# Patient Record
Sex: Female | Born: 1940 | Race: White | Hispanic: No | Smoking: Former smoker
Health system: Southern US, Community
[De-identification: ages and names within clinical notes are randomized; demographics above are authoritative.]

## PROBLEM LIST (undated history)

## (undated) DIAGNOSIS — I4891 Unspecified atrial fibrillation: Secondary | ICD-10-CM

## (undated) DIAGNOSIS — I10 Essential (primary) hypertension: Secondary | ICD-10-CM

## (undated) DIAGNOSIS — S060X9A Concussion with loss of consciousness of unspecified duration, initial encounter: Secondary | ICD-10-CM

## (undated) DIAGNOSIS — T4145XA Adverse effect of unspecified anesthetic, initial encounter: Secondary | ICD-10-CM

## (undated) DIAGNOSIS — L4050 Arthropathic psoriasis, unspecified: Secondary | ICD-10-CM

## (undated) DIAGNOSIS — G629 Polyneuropathy, unspecified: Secondary | ICD-10-CM

## (undated) DIAGNOSIS — Z9289 Personal history of other medical treatment: Secondary | ICD-10-CM

## (undated) DIAGNOSIS — M1990 Unspecified osteoarthritis, unspecified site: Secondary | ICD-10-CM

## (undated) DIAGNOSIS — T8859XA Other complications of anesthesia, initial encounter: Secondary | ICD-10-CM

## (undated) DIAGNOSIS — D649 Anemia, unspecified: Secondary | ICD-10-CM

## (undated) DIAGNOSIS — I499 Cardiac arrhythmia, unspecified: Secondary | ICD-10-CM

## (undated) DIAGNOSIS — S060XAA Concussion with loss of consciousness status unknown, initial encounter: Secondary | ICD-10-CM

## (undated) DIAGNOSIS — K219 Gastro-esophageal reflux disease without esophagitis: Secondary | ICD-10-CM

## (undated) HISTORY — PX: JOINT REPLACEMENT: SHX530

## (undated) HISTORY — PX: CHOLECYSTECTOMY: SHX55

## (undated) HISTORY — DX: Polyneuropathy, unspecified: G62.9

## (undated) HISTORY — PX: EYE SURGERY: SHX253

## (undated) HISTORY — PX: BACK SURGERY: SHX140

---

## 1972-01-26 HISTORY — PX: TUBAL LIGATION: SHX77

## 1981-01-25 HISTORY — PX: VAGINAL HYSTERECTOMY: SUR661

## 1997-08-15 ENCOUNTER — Other Ambulatory Visit: Admission: RE | Admit: 1997-08-15 | Discharge: 1997-08-15 | Payer: Self-pay | Admitting: Dermatology

## 1998-04-29 ENCOUNTER — Ambulatory Visit (HOSPITAL_COMMUNITY): Admission: RE | Admit: 1998-04-29 | Discharge: 1998-04-29 | Payer: Self-pay | Admitting: Gastroenterology

## 2000-01-07 ENCOUNTER — Emergency Department (HOSPITAL_COMMUNITY): Admission: EM | Admit: 2000-01-07 | Discharge: 2000-01-08 | Payer: Self-pay | Admitting: Emergency Medicine

## 2000-01-08 ENCOUNTER — Encounter: Payer: Self-pay | Admitting: Emergency Medicine

## 2000-02-05 ENCOUNTER — Encounter: Admission: RE | Admit: 2000-02-05 | Discharge: 2000-02-05 | Payer: Self-pay | Admitting: Orthopedic Surgery

## 2000-02-05 ENCOUNTER — Encounter: Payer: Self-pay | Admitting: Orthopedic Surgery

## 2000-12-12 ENCOUNTER — Encounter (HOSPITAL_BASED_OUTPATIENT_CLINIC_OR_DEPARTMENT_OTHER): Payer: Self-pay | Attending: General Surgery | Admitting: General Surgery

## 2002-05-20 ENCOUNTER — Encounter: Payer: Self-pay | Admitting: Emergency Medicine

## 2002-05-20 ENCOUNTER — Encounter: Payer: Self-pay | Admitting: Surgery

## 2002-05-20 ENCOUNTER — Inpatient Hospital Stay (HOSPITAL_COMMUNITY): Admission: EM | Admit: 2002-05-20 | Discharge: 2002-05-24 | Payer: Self-pay | Admitting: Emergency Medicine

## 2002-05-23 ENCOUNTER — Encounter: Payer: Self-pay | Admitting: Surgery

## 2002-05-23 ENCOUNTER — Encounter (INDEPENDENT_AMBULATORY_CARE_PROVIDER_SITE_OTHER): Payer: Self-pay | Admitting: *Deleted

## 2003-01-26 HISTORY — PX: POSTERIOR LUMBAR FUSION: SHX6036

## 2003-05-06 ENCOUNTER — Inpatient Hospital Stay (HOSPITAL_COMMUNITY): Admission: RE | Admit: 2003-05-06 | Discharge: 2003-05-08 | Payer: Self-pay | Admitting: Neurosurgery

## 2005-01-25 HISTORY — PX: REPLACEMENT TOTAL KNEE BILATERAL: SUR1225

## 2005-01-25 HISTORY — PX: CATARACT EXTRACTION W/ INTRAOCULAR LENS  IMPLANT, BILATERAL: SHX1307

## 2005-02-15 ENCOUNTER — Inpatient Hospital Stay (HOSPITAL_COMMUNITY): Admission: RE | Admit: 2005-02-15 | Discharge: 2005-02-19 | Payer: Self-pay | Admitting: Orthopedic Surgery

## 2005-05-31 ENCOUNTER — Encounter: Payer: Self-pay | Admitting: Cardiology

## 2005-05-31 ENCOUNTER — Inpatient Hospital Stay (HOSPITAL_COMMUNITY): Admission: RE | Admit: 2005-05-31 | Discharge: 2005-06-04 | Payer: Self-pay | Admitting: Orthopedic Surgery

## 2005-05-31 ENCOUNTER — Ambulatory Visit: Payer: Self-pay | Admitting: Cardiology

## 2006-04-08 ENCOUNTER — Encounter: Admission: RE | Admit: 2006-04-08 | Discharge: 2006-04-08 | Payer: Self-pay | Admitting: Internal Medicine

## 2006-05-23 ENCOUNTER — Inpatient Hospital Stay (HOSPITAL_COMMUNITY): Admission: EM | Admit: 2006-05-23 | Discharge: 2006-05-25 | Payer: Self-pay | Admitting: *Deleted

## 2006-05-24 ENCOUNTER — Encounter (INDEPENDENT_AMBULATORY_CARE_PROVIDER_SITE_OTHER): Payer: Self-pay | Admitting: Cardiovascular Disease

## 2006-05-24 HISTORY — PX: TRANSTHORACIC ECHOCARDIOGRAM: SHX275

## 2006-06-21 ENCOUNTER — Inpatient Hospital Stay (HOSPITAL_COMMUNITY): Admission: AD | Admit: 2006-06-21 | Discharge: 2006-06-24 | Payer: Self-pay | Admitting: Internal Medicine

## 2006-06-24 ENCOUNTER — Ambulatory Visit: Payer: Self-pay | Admitting: Gastroenterology

## 2006-08-14 ENCOUNTER — Encounter: Admission: RE | Admit: 2006-08-14 | Discharge: 2006-08-14 | Payer: Self-pay | Admitting: Neurosurgery

## 2007-02-01 ENCOUNTER — Encounter: Admission: RE | Admit: 2007-02-01 | Discharge: 2007-03-15 | Payer: Self-pay | Admitting: Neurology

## 2010-06-09 NOTE — H&P (Signed)
NAMETEIA, FREITAS NO.:  [id number]   MEDICAL RECORD NO.:  [id number]          PATIENT TYPE:  INP   LOCATION:  1338                         FACILITY:  [facility]   PHYSICIAN:  Barry Dienes. Eloise Harman, M.D.DATE OF BIRTH:  21-Sep-1940   DATE OF ADMISSION:  06/21/2006  DATE OF DISCHARGE:                              HISTORY & PHYSICAL   CHIEF COMPLAINT:  Nausea, vomiting and fever.   HISTORY OF PRESENT ILLNESS:  The patient is a 70 year old white female  who is well-known to me.  Starting last night, she developed nausea,  vomiting, fever and chills.  She had been eating and drinking well until  that point.  Yesterday, she ate and drank very little and did not take  any of her usual medications.  She had not had recent diarrhea, dysuria,  frequency, headaches, stiff neck, shortness of breath, or productive  cough.   She had recently been admitted to the hospital on May 23, 2006 for  similar symptoms and was noted to have an E. coli urinary tract  infection that was treated with antibiotics with rapid clinical  resolution and discharged to home within a few days of admission.   PAST MEDICAL HISTORY:  1. Osteoporosis with T12 wedge compression fracture.  2. Hypertension.  3. Psoriatic arthritis.  4. Osteoarthritis.  5. Iatrogenic adrenal insufficiency from chronic prednisone at 5 mg      daily for several years.  6. Gallstone pancreatitis leading to laparoscopic cholecystectomy,      2004.  7. Paroxysmal atrial fibrillation during her last admission that      rapidly resolved with IV Cardizem.  8. Lumbar spine degenerative disk disease.  9. Chronic low back pain for which she uses a cane to ambulate.  10.Mild anemia for which she had previously declined GI evaluation.   MEDICATIONS PRIOR TO HOSPITAL ADMISSION:  1. Neurontin 600 mg twice daily.  2. Celebrex 200 mg twice daily.  3. Premarin 0.3 mg p.o. daily.  4. Prednisone 5 mg daily.  5. Lasix 40 mg daily.  6.  Potassium chloride 20 mEq daily.  7. Norvasc 5 mg daily.  8. Toprol-XL 50 mg daily.  9. Flaxseed oil 1200 mg daily.  10.Multivitamin one tablet daily.  11.Prilosec 20 mg daily.  12.Aspirin 325 mg daily.  13.Iron sulfate 325 mg daily.  14.Calcium 800 mg daily.  15.Vitamin A and D supplements once daily.  16.Enbrel 50 mg injection every Tuesday.   ALLERGIES:  FOSAMAX has been associated with GI upset.   PAST SURGICAL HISTORY:  1. Total abdominal hysterectomy, 1993.  2. Liver biopsies to evaluate methotrexate treatment, 1995, 1997 and      2000.  3. April 2004, laparoscopic cholecystectomy.  4. April 2005, L3-4 and L4-5 decompressive laminectomy.  5. January 2007, right total knee replacement.  6. May 2007, left total knee replacement.  7. April 2007, bilateral cataract operations.   SOCIAL HISTORY:  She is married and is retired from a career with the  [employer].  She has a tobacco history of  25-pack-years that was stopped  in 1983 and no history of alcohol abuse.   FAMILY HISTORY:  Her father died at age 60 of a brain tumor.  Her mother  died at age 35 from cirrhosis due to alcoholism.  She has a brother in  his 92s and a half sister who died at age 7 of lung cancer.  She has a  94 year old daughter and a 37 year old son.  One son died at age 14 of a  hemorrhagic stroke in December 72.   REVIEW OF SYSTEMS:  See history of present illness.   INITIAL PHYSICAL EXAMINATION:  VITAL SIGNS:  Blood pressure 104/50,  pulse 112, respirations 22, temperature 103.1 degrees Fahrenheit.  GENERAL:  She is a mildly overweight white female who looks fatigued  while sitting upright in a wheelchair.  HEENT:  Exam was within normal limits.  NECK:  Supple and without jugulovenous distention or carotid bruit.  CHEST:  Very minimal left basilar crackles.  HEART:  Regular rhythm and rate without significant murmur or gallop.  ABDOMEN:  Greatly reduced bowel sounds  with no hepatosplenomegaly or  tenderness to palpation.  EXTREMITIES:  Without cyanosis, clubbing, or edema.  NEUROLOGIC:  She was alert with normal orientation and showed no focal  neurologic deficits.   INITIAL LABORATORY STUDIES:  Serum sodium 140, potassium 3.4, chloride  109, carbon dioxide 23, BUN 17, creatinine 1.0, glucose 114.  White  blood cell count 12.1, hemoglobin 11, hematocrit 34, platelets 214,000.  Total protein 5.3, albumin 2.9.  Liver-associated enzymes normal.  Urinalysis had 11-20 white blood cells with many bacteria.   Acute abdomen series x-rays showed normal chest x-ray with dilated small  bowel loops in the left and mid abdomen consistent with a small-bowel  obstruction.   A stool Hemoccult test was positive.   IMPRESSION AND PLAN:  1. Fever with nausea and vomiting:  This is most likely due to acute      pyelonephritis with secondary illness.  Other etiologies of dilated      small bowel loops are possible such as partial small-bowel      obstruction, given that she has had some abdominal surgery, or      inflammatory bowel disease, or neoplasm, given her previous      reluctance to have routine colonoscopy screening performed.  We      have initiated treatment with Rocephin 1 g intramuscularly in the      office and will continue 1 g intravenously once daily pending      results of urine culture.  2. Anemia:  Mild and possibly due to chronic disease.  It is also      possible that she has occult slow gastrointestinal bleeding.  We      will check usual anemia workup laboratories and request a      gastrointestinal consultation to exclude the possibility of partial      bowel obstruction or neoplasm contributing to her symptoms.  3. Adrenal insufficiency:  Iatrogenic and presumed due to longstanding      treatment with prednisone 5 mg.  She has been given Depo-Medrol 40     mg in the office and will be continued on hydrocortisone while she      is acutely  ill.  4. Gastroesophageal reflux disease:  Stable on proton pump inhibitor      treatment.  5. Psoriatic arthritis:  Reasonably stable on her previous outpatient      regimen.  Her oral medications will be restarted when  she resumes      normal bowel function.           ______________________________  Barry Dienes Eloise Harman, M.D.     DGP/MEDQ  D:  06/22/2006  T:  06/23/2006  Job:  [id number]   cc:   Windy Fast A. Darrelyn Hillock, M.D.  Fax: [phone]   Velora Heckler, MD  [phone] [place]  [place] [address]

## 2010-06-09 NOTE — Discharge Summary (Signed)
Andrea Yu, BYRD NO.:  [id number]   MEDICAL RECORD NO.:  [id number]          PATIENT TYPE:  INP   LOCATION:  4729                         FACILITY:  [facility]   PHYSICIAN:  Barry Dienes. Eloise Harman, M.D.DATE OF BIRTH:  Mar 11, 1940   DATE OF ADMISSION:  05/23/2006  DATE OF DISCHARGE:  05/25/2006                               DISCHARGE SUMMARY   PERTINENT FINDINGS:  The patient is a 70 year old white female with  multiple medical problems who developed symptoms of tactile  temperatures, chills, nausea, vomiting, and fever, over 2 days prior to  hospital admission.  She denied significant abdominal pain,  constipation, diarrhea, coffee-ground emesis, headaches, chest pain,  palpitations, orthopnea, or dyspnea on exertion.  Due to her nausea and  vomiting, she had been unable to ingest her normal medications, which  include prednisone 5 mg daily for several years.   See the admission history and physical for details of her extensive past  medical history, medications prior to admission, allergies, social  history, family history, review of systems, and past surgical history.   INITIAL PHYSICAL EXAM:  VITAL SIGNS:  Blood pressure 130/70, pulse 132,  respirations 22, temperature 98.6.  GENERAL:  She is an overweight white female who had nausea while sitting  in a wheelchair in our office.  She was somewhat pale appearing.  HEENT:  Exam was within normal limits.  NECK:  Without jugular venous distension or carotid bruit.  CHEST:  Clear to auscultation.  HEART:  Rapid, irregularly irregular rhythm without significant murmur  or gallop.  ABDOMEN:  Had normal bowel sounds and no tenderness.  EXTREMITIES:  Were without cyanosis, clubbing, or edema.  NEUROLOGICAL:  Exam was nonfocal.   INITIAL LABORATORY STUDIES:  White blood cell 17.2 with 90% neutrophils,  hemoglobin 12, hematocrit 36, platelets 154, serum sodium 140, potassium  3.3, chloride 107, BUN 14, creatinine 1.1,  glucose, 127, TSH 1.4.  Urinalysis was nitrite negative with white blood cells 11-20 and  bacteria many.   Acute abdomen series x-rays included normal chest x-ray and no acute  abdominal disease.   EKG showed the following:  1. Atrial fibrillation with a ventricular rate of 147.  2. Nonspecific ST-segment abnormalities.  Shortly after starting IV      Cardizem in the emergency room.  Her EKG converted to normal sinus      rhythm.   HOSPITAL COURSE:  The patient was admitted to a medical bed with  telemetry.  She was started on IV fluids and IV antibiotics for presumed  urinary tract infection.  She was seen by a cardiology consultant who  assisted with optimizing her cardiac medications.  She was also given  stress dose IV corticosteroids.  Her symptoms rapidly improved.  Her  urine culture grew out E-coli that was pansensitive at greater than  100,000 colony-forming units per milliliter.  She also had a 2-D  echocardiogram that showed normal left ventricular systolic function and  normal left atrial size.  It was recommended that she continued on  aspirin for stroke prophylaxis, since she so rapidly converted to a  sinus rhythm and had an echo that suggested she could stay out of atrial  fibrillation.   PROCEDURES:  A 2-D echocardiogram.   COMPLICATIONS:  None.   CONDITION ON DISCHARGE:  She had mild diarrhea but no nausea and had  been eating well.  She had no shortness of breath or palpitations or  chest pain.   PHYSICAL EXAMINATION:  VITAL SIGNS:  Most recent physical exam included  vital signs with blood pressure 122/64, pulse 69, respirations 18,  temperature 98.1, pulse oxygen saturation 97% on room air.  CHEST:  Clear to auscultation.  HEART:  Regular rate and rhythm.  ABDOMEN:  Benign.  EXTREMITIES:  Had bilateral trace ankle edema.   LABORATORY:  Most recent laboratory tests include white blood cell count  12.1, hemoglobin 8.9, hematocrit 27, platelets 135, serum  sodium 143,  potassium 3.1, chloride 110, carbon dioxide 24, BUN 11, creatinine 0.8,  glucose 145, magnesium 2.2, hemoglobin A1c 4.9%, amylase 18, lipase 14,  total cholesterol 90, triglycerides 285, HDL 24, LDL 9, troponin-I 1.61   DISCHARGE DIAGNOSES:  1. Acute pyelonephritis.  2. Paroxysmal atrial fibrillation.  3. Iatrogenic adrenal insufficiency.  4. Osteoporosis with T12 compression fracture.  5. Hypertension.  6. Psoriatic arthritis.  7. Osteoarthritis.  8. History of gallstone pancreatitis in 2004, leading to laparoscopic      cholecystectomy.  9. Low back pain with degenerative disk disease, status post lumbar      laminectomy in April 2005.  10.Status post bilateral total knee replacement surgery.  11.Hypokalemia.  12.Anemia.   DISCHARGE MEDICATIONS:  1. Ecotrin 325 mg p.o. daily.  2. Toprol XL 50 mg p.o. daily.  3. OTC Prilosec 20 mg p.o. daily.  4. Potassium chloride 20 mEq p.o. twice daily.  5. Prednisone 5 mg tablets at 4 tablets daily for 2 days, then 3      tablets daily for 2 days, then 2 tablets daily for 1 day, then 1      tablet daily.  6. Vantin 200 mg p.o. twice daily for 7 days.  7. Kaopectate as needed for diarrhea.  8. No Lasix 40 mg daily for the next 2 days.  9. She is advised to stop Ziac and no and not have Enbrel this week.  10.Fish oil 1,200 mg daily.  11.Iron 1 tablet daily.  12.Neurontin 600 mg p.o. b.i.d.  13.Premarin 0.3 mg p.o. daily.  14.Norvasc 5 mg daily.  15.Celebrex 2 mg twice daily.  16.Multivitamin 1 tablet daily.  17.Citracal D 600 3 times daily.  18.Vicodin 5/500 1 tablet p.o. b.i.d. p.r.n. pain.  19.Balmex diaper cream as needed for yeast rash.  She was advised to      call our office as soon as possible, if her diarrhea worsens or      becomes associated with fever, chills, or if nausea or vomiting     develops.  She was advised to have a follow-up appointment with Dr.      Eloise Harman at [facility], in  approximately 1 week      following discharge, and to have follow follow-up evaluation with      Dr. Jenne Campus at [facility], in approximately 1 month      following discharge.           ______________________________  Barry Dienes. Eloise Harman, M.D.     DGP/MEDQ  D:  07/21/2006  T:  07/21/2006  Job:  [id number]   cc:   Pollyann Savoy, M.D.  Darlin Priestly,  MD

## 2010-06-09 NOTE — Discharge Summary (Signed)
Andrea Yu, Andrea Yu NO.:  [id number]   MEDICAL RECORD NO.:  [id number]          PATIENT TYPE:  INP   LOCATION:  1338                         FACILITY:  [facility]   PHYSICIAN:  Barry Dienes. Eloise Harman, M.D.DATE OF BIRTH:  05/04/1940   DATE OF ADMISSION:  06/21/2006  DATE OF DISCHARGE:  06/24/2006                               DISCHARGE SUMMARY   PERTINENT FINDINGS:  Patient is a 70 year old white female who developed  nausea, vomiting, fever, and chills on the night prior to admission.  She also ate and drank very little and did not have her usual medicines.  She had not had recent diarrhea, dysuria, frequency, headaches, stiff  neck, shortness of breath, or productive cough.  She had recently been  admitted to the hospital on April 28 for similar symptoms and was noted  to have an E. coli urinary tract infection that was treated with  antibiotics with rapid resolution of symptoms.   See admission history and physical for details of her past medical  history, medications prior to admission, past surgical history, and  review of systems.   INITIAL PHYSICAL EXAMINATION:  VITAL SIGNS:  Blood pressure 104/50,  pulse 112, respirations 22, temperature 103.1 degrees Fahrenheit.  GENERAL:  She is a mildly overweight white female who looks fatigued  while sitting upright in a wheelchair.  HEENT:  Head, eyes, ears, nose and throat exam was within normal limits.  NECK:  Supple and without jugular venous distention or carotid bruit.  CHEST:  Had very minimal left basilar crackles.  HEART:  Had a regular rate and rhythm without significant murmur or  gallop.  ABDOMEN:  Had greatly reduced bowel sounds with no hepatosplenomegaly or  tenderness to palpation.  EXTREMITIES:  Without cyanosis, clubbing or edema.  NEUROLOGICAL:  She was alert with normal orientation, showed no focal  neurologic deficits.   INITIAL LABORATORY STUDIES:  Serum sodium 140, potassium 3.4, chloride  109,  carbon dioxide 23, BUN 17, creatinine 1.0, glucose 114.  White  blood cell count 12.1, hemoglobin 11, hematocrit 34, platelets 214.  Total protein 5.2, albumin 2.9.  Liver-associated enzymes normal.  Urinalysis had 11-20 white blood cells with many bacteria.  Acute  abdomen x-ray showed normal chest x-ray with dilated small bowel loops  in the left and mid abdomen suggestive of small bowel obstruction.  A  stool hemoccult test was positive.   HOSPITAL COURSE:  Patient was admitted to a medical bed without  telemetry.  She was started on Rocephin IV with stress-dose  corticosteroids for presumptive recurrent urinary tract infection.  She  was also given IV fluids.  With IV fluids her hematocrit decreased to  the 27 range, so anemia workup labs were done.  She was also seen by a  GI consult, who reviewed a CT scan of the abdomen and pelvis that was  most notable for showing ileum wall thickening suggestive of enteritis.  He recommended obtaining stool cultures, which were pending at the time  of discharge, as well as C. difficile test and eventual colonoscopy with  intubation of the terminal  ileum after resolution of her acute illness.  Her symptoms rapidly improved with Rocephin and she became afebrile.  She was able to tolerate an advancing diet without difficulty.   PROCEDURES:  CT scan of the abdomen and pelvis which showed the  following:  1. A 4.9-mm lingula nodule.  2. Decreased distention of small bowel loops, now to a maximum 3.3 cm.  3. Fatty liver.  4. Mild perinephric fat stranding around left kidney.  5. Ileum wall thickening.  6. No evidence of renal stones or hydronephrosis.   COMPLICATIONS:  None.   CONDITION ON DISCHARGE:  She had no nausea, vomiting, diarrhea, or  shortness of breath.   Most recent physical exam shows blood pressure 157/60, pulse 64,  respirations 16, temp 98.2, pulse oxygen saturation 97% on room air.  Her chest was clear to auscultation.  Her  heart had a regular rate and  rhythm without significant murmur or gallop.  The abdomen had normal  bowel sounds and no hepatosplenomegaly or tenderness.  Extremities were  without cyanosis, clubbing or edema.   Most recent laboratory tests include PT of 14.8, INR of 1.1 (note that  Coumadin was held).  Stool C. difficile toxin test was negative.  Stool  cultures with ova and parasite testing were pending.  Urine culture was  negative.  Reticulocyte count was 3.3%, serum iron less than 10,  ferritin 50.  Stool hemoccult test was positive.  Serum sodium 143,  potassium 4.1, chloride 114, carbon dioxide 24, BUN 13, creatinine 0.87,  glucose 129.  White blood cell count 8.3, hemoglobin 9.5, hematocrit  28.2, platelets 169.   DIAGNOSES:  1. Acute enteritis with nausea and vomiting.  2. Bacteruria.  3. Iatrogenic adrenal insufficiency.  4. Osteoporosis with T12 compression fracture.  5. History of paroxysmal atrial fibrillation.  6. Hypertension.  7. Psoriatic arthritis.  8. Osteoarthritis.  9. Lumbar spine degenerative disk disease.  10.Chronic low back pain.  11.Anemia, iron deficiency.   DISCHARGE MEDICATIONS:  1. Neurontin 300 mg two tabs p.o. b.i.d.  2. Celebrex 200 mg p.o. b.i.d. p.r.n. pain.  3. Premarin 0.3 mg p.o. daily.  4. Lasix 40 mg p.o. q.a.m. to be restarted in two days.  5. Cardizem CD 120 mg p.o. daily.  6. Toprol XL 50 mg p.o. q.p.m.  7. Prilosec 20 mg p.o. daily.  8. Aspirin 325 mg p.o. daily.  9. Potassium chloride 20 mEq p.o. daily.  10.Iron sulfate 325 mg p.o. twice daily.  11.Calcium 1800 mg daily (Citracal D 600 three times daily), vitamin      A, flaxseed, and multivitamin per usual.  12.Enbrel 50 mg injection every Tuesday to be restarted in two weeks.  13.Prednisone 5-mg tablets as follows:  Three tabs daily for four      days, then two tabs daily for three days, then one tab daily. 14.Coumadin 5 mg daily.  15.Keflex 500 mg p.o. t.i.d. x7 days.    DISPOSITION AND FOLLOWUP:  The patient is to be discharged to home if  she tolerates a regular breakfast well.  She should have a followup  appointment with Dr. Jarome Matin of [facility]  either next Tuesday or Wednesday of next week and can call [phone] to  schedule that appointment.  She should also have a followup appointment  with Dr. Russella Dar in approximately 2-3 weeks at [facility].  She  was advised to call Dr. Eloise Harman for any unusual bleeding or recurrent  fever, chills or weakness.  ______________________________  Barry Dienes Eloise Harman, M.D.     DGP/MEDQ  D:  06/24/2006  T:  06/24/2006  Job:  [id number]   cc:   Venita Lick. Russella Dar, MD, FACG  [address]  [place]  [place] [address]   Dr. Jenne Campus, MD

## 2010-06-09 NOTE — Discharge Summary (Signed)
Andrea Yu, STRICKER NO.:  [id number]   MEDICAL RECORD NO.:  [id number]          PATIENT TYPE:  INP   LOCATION:  4729                         FACILITY:  [facility]   PHYSICIAN:  Barry Dienes. Eloise Harman, M.D.DATE OF BIRTH:  [birth year]   DATE OF ADMISSION:  05/23/2006  DATE OF DISCHARGE:  05/25/2006                               DISCHARGE SUMMARY   HISTORY OF PRESENT ILLNESS:  The patient is a 70 year old, white female  with multiple medical problems who on April 26, developed chills with  tactile temperatures, nausea and vomiting.  She had similar symptoms on  the day prior to admission and on the morning of admission.  Her last  normal meal was approximately 2 days ago.  She denied significant  abdominal pain, constipation, diarrhea, coffee-grounds emesis or  headaches.  She had mild dysuria and frequency.  She also denied  substernal chest pain or palpitations.  She denied dyspnea on exertion,  productive cough, orthopnea or paroxysmal nocturnal dyspnea.  Due to her  nausea and vomiting, she had not been able to ingest her normal  medications which include prednisone 5 mg daily for several years.   See admission history and physical for details of her past medical  history, medications prior to admission, allergies, social history,  family history and review of systems.   PHYSICAL EXAMINATION:  VITAL SIGNS:  Blood pressure 130/70, pulse 132,  respirations 22, temperature 98.6.  GENERAL:  She is an overweight, white female who had nausea while  sitting in a wheelchair.  She was somewhat pale-appearing.  HEENT:  Significant for the lack of scleral icterus.  NECK:  Supple with no jugular venous distention or carotid bruit.  CHEST:  Clear to auscultation.  HEART:  Rapid, irregularly, irregular rhythm without significant murmur  or gallop.  ABDOMEN:  Normal bowel sounds and no hepatosplenomegaly or tenderness.  EXTREMITIES:  Without cyanosis, clubbing or edema.  NEUROLOGIC:  Without focal deficits.   LABORATORY DATA AND X-RAY FINDINGS:  Initial laboratory studies with  white blood cells 17.2 with 90% neutrophils, hemoglobin 12, hematocrit  36, platelets 154.  Serum sodium 140, potassium 3.3, chloride 107, BUN  14, creatinine 1.1, glucose 127, magnesium 2.2.  CPK 169.  TSH 1.4.  Troponin I 0.05.  Urinalysis was nitrite negative with white blood cells  11-20 and bacteria many.   Acute abdominal series showed normal chest x-ray and no acute abdominal  disease.  EKG showed atrial fibrillation with ventricular rate of 147,  nonspecific ST segment abnormalities.   HOSPITAL COURSE:  Shortly after starting IV Cardizem in the emergency  room, her EKG converted to normal sinus rhythm.   Dictation ended at this point.           ______________________________  Barry Dienes. Eloise Harman, M.D.     DGP/MEDQ  D:  07/21/2006  T:  07/21/2006  Job:  [id number]   cc:   Velora Heckler, MD  Georges Lynch Darrelyn Hillock, M.D.  Hewitt Shorts, M.D.  Kathryne Hitch, MD

## 2010-06-12 NOTE — Consult Note (Signed)
NAMEPURVI, RUEHL NO.:  [id number]   MEDICAL RECORD NO.:  [id number]          PATIENT TYPE:  INP   LOCATION:  1403                         FACILITY:  [facility]   PHYSICIAN:  Willa Rough, M.D.     DATE OF BIRTH:  07/01/1940   DATE OF CONSULTATION:  DATE OF DISCHARGE:                                   CONSULTATION   PRIMARY CARE PHYSICIAN:  Dr. Jarold Motto.  The patient is new to our practice.   SUPERVISING PHYSICIAN:  Willa Rough, M.D.   HISTORY OF PRESENT ILLNESS:  [name] is a 70 year old white female who  is admitted today for a left total knee replacement secondary to long  standing arthritis, limiting her activities.  Her surgery was performed  today, and this was uneventful.  However, in PACU, she developed atrial  fibrillation with a ventricular rate in the 150's.  She  received a 15 mg  bolus of IV Cardizem and a drip at 15 mg per hour and now her ventricular  rate is in the 110's to 120's.  Her EKG confirmed atrial fibrillation.  She  does have some precordial ST depression, but no acute changes.  Her  preoperative vitals were 159/85, pulse 81, respirations 20.   Andrea Yu is unaware of any palpitations in PACU.  She states that she  feels fine other than sleepy and experiencing some knee discomfort  postoperatively.  She denies any prior history of palpitations, chest  discomfort or shortness of breath.  Her activities are limited secondary to  her arthritis, particularly in her back and knees.  She states that she is  aware of the term atrial fibrillation as her husband is being treated for  this by Dr. Alanda Amass.   ALLERGIES:  No known drug allergies.   MEDICATIONS PRIOR TO ADMISSION:  1.  Linbrel 50 mg on Tuesday and Friday.  2.  Neurontin 300 mg q.h.s.  3.  Lasix 20 mg q.a.m.  4.  Premarin 0.3 mg daily.  5.  Ziac 10/6.25 daily.  6.  Prednisone 5 mg daily.  7.  Norvasc 5 mg daily.  8.  Celebrex 200 mg daily.  9.  K-Dur 20 mg  daily.  10. Vicodin 5/100 p.r.n.  11. Multivitamin daily.  12. Calcium 1800 mg daily.  13. Flax oil 1200 mg daily.  14. Vitamin A&D daily.  15. __________ 75 mg t.i.d.  16. Hydrocodone 5/325 t.i.d.  17. Robaxin 500 mg p.r.n.   PAST MEDICAL HISTORY:  1.  Arthritis.  2.  Psoriasis.  3.  Osteoarthritis.  4.  Obesity.  5.  Status post BTL in 1974.  6.  Hysterectomy in 1993.  7.  Laparoscopic cholecystectomy in 1994.  8.  Lumbar laminectomy.  9.  Right total knee replacement January 2007 (this procedure was      uncomplicated).  10. Denies any history of diabetes, myocardial infarction, CVA, COPD,      bleeding dyscrasias or thyroid dysfunction.   SOCIAL HISTORY:  She resides in [place] with her husband.  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  She has one  son who died at the age of 90 with a cerebral aneurysm.  She has two  children that are living and well.  She has not smoked since 1983.  Denies  any alcohol, drugs.  She does not maintain a specific diet.  She does not  exercise as her activities are limited by her arthritis.  Prior to  admission, she was using a cane for assistance.   FAMILY HISTORY:  Mother died in her 50's secondary to complications with  alcohol cirrhosis.  Father also died in his 75's with a brain tumor.  She  has one brother, 73, who has some type of heart problem and a history of  arthritis.  One sister is deceased from lung cancer.   REVIEW OF SYSTEMS:  Notable for lower extremity edema, post menopausal  arthralgias in the back of knees.   PHYSICAL EXAMINATION:  GENERAL APPEARANCE:  Well-nourished, well-developed,  pleasant, obese white female in no acute distress.  She is somewhat groggy  in the PACU.  VITAL SIGNS:  Pulse 112 at this time and irregular, respirations 15 and  regular, blood pressure 116/47, 100% saturation on 2 liters.  HEENT:  Essentially unremarkable.  NECK:  Supple without thyromegaly,  __________, JVD or carotid bruits.  It is  noted that her neck is full secondary to obesity.  CHEST:  Symmetrical excursion.  LUNGS:  Clear to auscultation, anterolaterally.  HEART:  Irregular rate and rhythm with a variable rate.  I do not appreciate  any murmurs rubs clicks or gallops.  All pulses are symmetrical and intact  without evidence of abdominal or femoral bruits.  SKIN INTEGUMENT:  Appears to be intact.  Her left knee is bandaged with a  drain.  ABDOMEN:  Obese, bowel sounds are diminished without organomegaly, masses or  tenderness.  EXTREMITIES:  No cyanosis, clubbing or edema appreciated.  Left lower  extremity is in knee immobilizer.  She has bilateral TED hose.  NEUROLOGICAL:  Unremarkable.   STUDIES:  Preoperative chest x-ray from February 10, 2005, did not show any  acute pulmonary processes.  EKG from February 10, 2005, showed normal sinus  rhythm, left axis deviation, normal intervals, early R waves, nonspecific ST-  T wave changes.  No old EKG's were available for comparison.  Current EKG  shows atrial fibrillation with ventricular rate of 119 with some precordial  ST segment depression.   LABORATORY DATA:  Preoperative labs on May 26, 2005, showed an H&H of  11.0/32.8, platelets 290,000, WBC 8.9.  Sodium 146, potassium 37, BUN 17,  creatinine 0.9, glucose 110.  PTT 30, PT 12.7.  Urinalysis was unremarkable.   IMPRESSION:  Status post left total knee replacement, currently in PACU.  Postoperative atrial fibrillation with increased ventricular rate with some  improvement with IV Cardizem.  Preoperative hypertension, obesity.  History  is noted in the past medical history.   DISPOSITION:  I discussed with Dr. Priscille Kluver anticoagulation.  We will  continue her current Arixtra that has been prescribed by Dr. Priscille Kluver at this  time.  If she remains in atrial fibrillation in the morning, we should consider beginning IV heparin or Lovenox as a bridge to long-term Coumadin   therapy.  Pain control will also help to keep her heart rate over 100.  We  will continue IV Cardizem, and hopefully she will convert to normal sinus  rhythm once her ventricular rate slows.  We will continue her home beta  blocker, Ziac, as well as her Lasix and potassium.  A recheck CBC and B-meet  in the morning.  I have also added CK totals and MB's and troponins and a  TSH to her blood work.  We will have an echocardiogram performed to evaluate  her LV function and structural component of her heart.      Joellyn Rued, P.A. [facility]    ______________________________  Willa Rough, M.D.    EW/MEDQ  D:  05/31/2005  T:  06/01/2005  Job:  [id number]   cc:   Willa Rough, M.D.  [address]  [address]  [place]  [place] [address]

## 2010-06-12 NOTE — Op Note (Signed)
Andrea Yu, Andrea Yu                 ACCOUNT NO.:  [id number]   MEDICAL RECORD NO.:  [id number]          PATIENT TYPE:  INP   LOCATION:  1403                         [facility]   PHYSICIAN:  John L. Rendall, M.D.  DATE OF BIRTH:  April 07, 1940   DATE OF PROCEDURE:  05/31/2005  DATE OF DISCHARGE:                                 OPERATIVE REPORT   INDICATIONS AND JUSTIFICATION FOR PROCEDURE:  Bone against bone medially,  left knee with pain unremitting despite conservative measures.  She does  have a total knee on the other side with a good result.   PREOPERATIVE DIAGNOSIS:  End-stage osteoarthritis left knee.   SURGICAL PROCEDURES:  Left L C S total knee replacement with computer  navigation assistance.   POSTOPERATIVE DIAGNOSIS:  End stage osteoarthritis left knee.   PATHOLOGY:  The patient has 5.5 degrees varus and 3 degrees flexion before  surgery.  Computer navigation was felt to be assistive in correcting this  alignment balancing the knee.  There was bone against bone medially in the  patellofemoral articulation.   ANESTHESIA:  General with femoral nerve block.   SURGEON:  Rendall.   ASSISTANT:  Oris Drone. Petrarca, P.A.-C.   PROCEDURE:  Under general anesthesia, left leg was prepared with DuraPrep  and draped as a sterile field.  Leg had a sterile tourniquet applied  proximally, was wrapped out with the Esmarch and a tourniquet was used at  350 mm.  Midline incision was made.  The patella was everted.  The femur was  sized at roughly a standard.  Debridement was then done for computer  mapping.  Schanz pins were placed distally in the medial femur and  proximally in the medial tibia through accessory stab wounds there.  The  arrays were set up.  The femoral head was identified.  Medial and lateral  malleoli were identified.  The proximal tibial mapping was then done and  distal femoral mapping.  All checked within half a millimeter of  reproducible accuracy.  Once this  was done, the first tibial resection guide  was used for proximal tibial resection.  This was then checked and was  within 1 degree of alignment on varus valgus.  The balancer was then  inserted and it took several passes with the balancer to get enough release  of the 5-1/2 degrees varus to straighten the leg.  Once this was done, the  flexion gap was measured surgical planning was then done and computer was  then used for anterior and posterior cuts on the distal femur. Distal  femoral cut with the computer again within 1 degree of anatomic alignment.  Recessing guide was then used.  The lamina spreader was inserted.  Remnants  of the cruciates and menisci were resected and posterior condyles of the  femur were debrided of spurs.  Once this was complete, the tibia was sized  to a #2.  Center peg hole with keel was inserted and number 12-1/2 bearing  was inserted.  Femoral component standard was used.  Excellent fit and  alignment, stability were obtained again  within 1 degree of anatomic  alignment.  This was in 1 degree of valgus and 1-1/2 degrees flexed.  Permanent components were then obtained.  The Schanz pins were removed and  the arrays.  Bony surfaces were prepared with pulse irrigation.  All  components were then cemented in place.  Medium Hemovac was  inserted.  Synovectomy carried out.  The knee was then closed in layers with  #1 Tycron, #0 Vicryl, 2-0 Vicryl and skin clips.  The cautery of the  tourniquet was let down prior to closure and multiple small vessels  cauterized.  The patient returned to recovery in good condition.      John L. Rendall, M.D.  Electronically Signed     JLR/MEDQ  D:  05/31/2005  T:  06/01/2005  Job:  [id number]

## 2010-09-18 ENCOUNTER — Other Ambulatory Visit: Payer: Self-pay | Admitting: Dermatology

## 2012-08-09 ENCOUNTER — Inpatient Hospital Stay (HOSPITAL_COMMUNITY)
Admission: EM | Admit: 2012-08-09 | Discharge: 2012-08-12 | DRG: 641 | Disposition: A | Discharge status: Home / Self Care | Payer: Medicare Other | Attending: Internal Medicine | Admitting: Internal Medicine

## 2012-08-09 ENCOUNTER — Emergency Department (HOSPITAL_COMMUNITY): Payer: Medicare Other

## 2012-08-09 ENCOUNTER — Other Ambulatory Visit: Payer: Self-pay

## 2012-08-09 ENCOUNTER — Encounter (HOSPITAL_COMMUNITY): Payer: Self-pay | Admitting: Emergency Medicine

## 2012-08-09 DIAGNOSIS — R6510 Systemic inflammatory response syndrome (SIRS) of non-infectious origin without acute organ dysfunction: Secondary | ICD-10-CM | POA: Diagnosis present

## 2012-08-09 DIAGNOSIS — Z880 Allergy status to penicillin: Secondary | ICD-10-CM

## 2012-08-09 DIAGNOSIS — E876 Hypokalemia: Secondary | ICD-10-CM | POA: Diagnosis present

## 2012-08-09 DIAGNOSIS — I480 Paroxysmal atrial fibrillation: Secondary | ICD-10-CM | POA: Diagnosis present

## 2012-08-09 DIAGNOSIS — I10 Essential (primary) hypertension: Secondary | ICD-10-CM | POA: Diagnosis present

## 2012-08-09 DIAGNOSIS — I4891 Unspecified atrial fibrillation: Secondary | ICD-10-CM

## 2012-08-09 DIAGNOSIS — E2749 Other adrenocortical insufficiency: Secondary | ICD-10-CM | POA: Diagnosis present

## 2012-08-09 DIAGNOSIS — N390 Urinary tract infection, site not specified: Secondary | ICD-10-CM | POA: Diagnosis present

## 2012-08-09 DIAGNOSIS — E669 Obesity, unspecified: Secondary | ICD-10-CM | POA: Diagnosis present

## 2012-08-09 DIAGNOSIS — IMO0001 Reserved for inherently not codable concepts without codable children: Secondary | ICD-10-CM | POA: Diagnosis present

## 2012-08-09 DIAGNOSIS — I959 Hypotension, unspecified: Secondary | ICD-10-CM | POA: Diagnosis present

## 2012-08-09 DIAGNOSIS — D649 Anemia, unspecified: Secondary | ICD-10-CM

## 2012-08-09 DIAGNOSIS — Z79899 Other long term (current) drug therapy: Secondary | ICD-10-CM

## 2012-08-09 DIAGNOSIS — IMO0002 Reserved for concepts with insufficient information to code with codable children: Secondary | ICD-10-CM

## 2012-08-09 DIAGNOSIS — R112 Nausea with vomiting, unspecified: Secondary | ICD-10-CM | POA: Diagnosis present

## 2012-08-09 DIAGNOSIS — E2740 Unspecified adrenocortical insufficiency: Secondary | ICD-10-CM | POA: Diagnosis present

## 2012-08-09 DIAGNOSIS — L4050 Arthropathic psoriasis, unspecified: Secondary | ICD-10-CM | POA: Diagnosis present

## 2012-08-09 DIAGNOSIS — Z6837 Body mass index (BMI) 37.0-37.9, adult: Secondary | ICD-10-CM

## 2012-08-09 DIAGNOSIS — F3289 Other specified depressive episodes: Secondary | ICD-10-CM | POA: Diagnosis present

## 2012-08-09 DIAGNOSIS — K219 Gastro-esophageal reflux disease without esophagitis: Secondary | ICD-10-CM | POA: Diagnosis present

## 2012-08-09 DIAGNOSIS — E860 Dehydration: Principal | ICD-10-CM | POA: Diagnosis present

## 2012-08-09 DIAGNOSIS — D509 Iron deficiency anemia, unspecified: Secondary | ICD-10-CM | POA: Diagnosis present

## 2012-08-09 DIAGNOSIS — F329 Major depressive disorder, single episode, unspecified: Secondary | ICD-10-CM | POA: Diagnosis present

## 2012-08-09 DIAGNOSIS — R1110 Vomiting, unspecified: Secondary | ICD-10-CM

## 2012-08-09 DIAGNOSIS — Z96659 Presence of unspecified artificial knee joint: Secondary | ICD-10-CM

## 2012-08-09 DIAGNOSIS — A419 Sepsis, unspecified organism: Secondary | ICD-10-CM

## 2012-08-09 HISTORY — DX: Unspecified atrial fibrillation: I48.91

## 2012-08-09 HISTORY — DX: Essential (primary) hypertension: I10

## 2012-08-09 HISTORY — DX: Unspecified osteoarthritis, unspecified site: M19.90

## 2012-08-09 LAB — URINALYSIS, ROUTINE W REFLEX MICROSCOPIC
Bilirubin Urine: NEGATIVE
Glucose, UA: NEGATIVE mg/dL
Ketones, ur: NEGATIVE mg/dL
Protein, ur: NEGATIVE mg/dL
pH: 6 (ref 5.0–8.0)

## 2012-08-09 LAB — HEPATIC FUNCTION PANEL
AST: 12 U/L (ref 0–37)
Albumin: 2.3 g/dL — ABNORMAL LOW (ref 3.5–5.2)
Total Protein: 5.4 g/dL — ABNORMAL LOW (ref 6.0–8.3)

## 2012-08-09 LAB — CBC WITH DIFFERENTIAL/PLATELET
Basophils Relative: 0 % (ref 0–1)
Eosinophils Absolute: 0.1 10*3/uL (ref 0.0–0.7)
Eosinophils Relative: 1 % (ref 0–5)
Hemoglobin: 8.6 g/dL — ABNORMAL LOW (ref 12.0–15.0)
MCH: 23.9 pg — ABNORMAL LOW (ref 26.0–34.0)
MCHC: 31 g/dL (ref 30.0–36.0)
Monocytes Relative: 14 % — ABNORMAL HIGH (ref 3–12)
Neutrophils Relative %: 76 % (ref 43–77)

## 2012-08-09 LAB — BASIC METABOLIC PANEL
CO2: 20 mEq/L (ref 19–32)
Calcium: 8.1 mg/dL — ABNORMAL LOW (ref 8.4–10.5)
Chloride: 102 mEq/L (ref 96–112)
Glucose, Bld: 118 mg/dL — ABNORMAL HIGH (ref 70–99)
Potassium: 2.8 mEq/L — ABNORMAL LOW (ref 3.5–5.1)

## 2012-08-09 LAB — CG4 I-STAT (LACTIC ACID): Lactic Acid, Venous: 2 mmol/L (ref 0.5–2.2)

## 2012-08-09 LAB — URINE MICROSCOPIC-ADD ON

## 2012-08-09 LAB — RETICULOCYTES
RBC.: 3.64 MIL/uL — ABNORMAL LOW (ref 3.87–5.11)
Retic Ct Pct: 1.2 % (ref 0.4–3.1)

## 2012-08-09 MED ORDER — GABAPENTIN 600 MG PO TABS: 600.0000 mg | ORAL_TABLET | Freq: Two times a day (BID) | ORAL | Status: DC

## 2012-08-09 MED ORDER — ENOXAPARIN SODIUM 40 MG/0.4ML ~~LOC~~ SOLN
40.0000 mg | SUBCUTANEOUS | Status: DC
  Administered 2012-08-09 – 2012-08-11 (×3): 40 mg via SUBCUTANEOUS
  Filled 2012-08-09 (×4): qty 0.4

## 2012-08-09 MED ORDER — SODIUM CHLORIDE 0.9 % IV SOLN
1000.0000 mL | Freq: Once | INTRAVENOUS | Status: AC
  Administered 2012-08-09: 1000 mL via INTRAVENOUS

## 2012-08-09 MED ORDER — PANTOPRAZOLE SODIUM 40 MG PO TBEC
40.0000 mg | DELAYED_RELEASE_TABLET | Freq: Every day | ORAL | Status: DC
  Administered 2012-08-09 – 2012-08-12 (×4): 40 mg via ORAL
  Filled 2012-08-09 (×4): qty 1

## 2012-08-09 MED ORDER — GABAPENTIN 300 MG PO CAPS
600.0000 mg | ORAL_CAPSULE | Freq: Two times a day (BID) | ORAL | Status: DC
  Administered 2012-08-09: 600 mg via ORAL
  Filled 2012-08-09 (×3): qty 2

## 2012-08-09 MED ORDER — POTASSIUM CHLORIDE CRYS ER 20 MEQ PO TBCR
40.0000 meq | EXTENDED_RELEASE_TABLET | Freq: Once | ORAL | Status: AC
  Administered 2012-08-09: 40 meq via ORAL
  Filled 2012-08-09: qty 2

## 2012-08-09 MED ORDER — SODIUM CHLORIDE 0.9 % IV SOLN
INTRAVENOUS | Status: DC
  Administered 2012-08-10: 06:00:00 via INTRAVENOUS

## 2012-08-09 MED ORDER — ETANERCEPT 50 MG/ML ~~LOC~~ SOLN: 50.0000 mg | SUBCUTANEOUS | Status: DC

## 2012-08-09 MED ORDER — ACETAMINOPHEN 325 MG PO TABS
650.0000 mg | ORAL_TABLET | Freq: Once | ORAL | Status: DC
  Filled 2012-08-09: qty 2

## 2012-08-09 MED ORDER — POTASSIUM CHLORIDE CRYS ER 20 MEQ PO TBCR: 20.0000 meq | EXTENDED_RELEASE_TABLET | Freq: Every day | ORAL | Status: DC

## 2012-08-09 MED ORDER — ASPIRIN EC 81 MG PO TBEC
81.0000 mg | DELAYED_RELEASE_TABLET | Freq: Every day | ORAL | Status: DC
  Administered 2012-08-09 – 2012-08-11 (×3): 81 mg via ORAL
  Filled 2012-08-09 (×4): qty 1

## 2012-08-09 MED ORDER — SODIUM CHLORIDE 0.9 % IV SOLN
INTRAVENOUS | Status: DC
  Administered 2012-08-09: 22:00:00 via INTRAVENOUS

## 2012-08-09 MED ORDER — METHYLPREDNISOLONE SODIUM SUCC 125 MG IJ SOLR
60.0000 mg | Freq: Four times a day (QID) | INTRAMUSCULAR | Status: DC
  Administered 2012-08-09: 60 mg via INTRAVENOUS
  Filled 2012-08-09 (×3): qty 0.96
  Filled 2012-08-09: qty 2

## 2012-08-09 MED ORDER — SODIUM CHLORIDE 0.9 % IV BOLUS (SEPSIS)
1000.0000 mL | Freq: Once | INTRAVENOUS | Status: AC
  Administered 2012-08-09: 1000 mL via INTRAVENOUS

## 2012-08-09 MED ORDER — METHYLPREDNISOLONE SODIUM SUCC 125 MG IJ SOLR
60.0000 mg | Freq: Four times a day (QID) | INTRAMUSCULAR | Status: DC
  Administered 2012-08-09 – 2012-08-11 (×7): 60 mg via INTRAVENOUS
  Filled 2012-08-09 (×11): qty 0.96

## 2012-08-09 MED ORDER — SODIUM CHLORIDE 0.9 % IV SOLN
1000.0000 mL | INTRAVENOUS | Status: DC
  Administered 2012-08-09: 1000 mL via INTRAVENOUS

## 2012-08-09 MED ORDER — CIPROFLOXACIN IN D5W 400 MG/200ML IV SOLN
400.0000 mg | Freq: Two times a day (BID) | INTRAVENOUS | Status: DC
  Administered 2012-08-10 – 2012-08-12 (×5): 400 mg via INTRAVENOUS
  Filled 2012-08-09 (×7): qty 200

## 2012-08-09 MED ORDER — POTASSIUM CHLORIDE CRYS ER 20 MEQ PO TBCR
40.0000 meq | EXTENDED_RELEASE_TABLET | Freq: Two times a day (BID) | ORAL | Status: DC
  Administered 2012-08-09 – 2012-08-11 (×5): 40 meq via ORAL
  Filled 2012-08-09 (×7): qty 2

## 2012-08-09 MED ORDER — DILTIAZEM HCL ER COATED BEADS 180 MG PO CP24
180.0000 mg | ORAL_CAPSULE | Freq: Every day | ORAL | Status: DC
  Administered 2012-08-09 – 2012-08-11 (×3): 180 mg via ORAL
  Filled 2012-08-09 (×4): qty 1

## 2012-08-09 MED ORDER — DULOXETINE HCL 60 MG PO CPEP: 60.0000 mg | ORAL_CAPSULE | Freq: Every day | ORAL | Status: DC

## 2012-08-09 MED ORDER — DULOXETINE HCL 30 MG PO CPEP
90.0000 mg | ORAL_CAPSULE | Freq: Every day | ORAL | Status: DC
  Administered 2012-08-09: 90 mg via ORAL
  Filled 2012-08-09 (×2): qty 3

## 2012-08-09 NOTE — ED Notes (Signed)
I/O CATH COMPLETED, NO RETURN

## 2012-08-09 NOTE — ED Provider Notes (Signed)
History    CSN: [id number] Arrival date & time 08/09/12  1238  First MD Initiated Contact with Patient 08/09/12 1253     Chief Complaint  Patient presents with  . Fever  . Diarrhea    Patient is a 72 y.o. female presenting with vomiting. The history is provided by the patient.  Emesis Severity:  Moderate Timing:  Intermittent Progression:  Worsening Chronicity:  New Relieved by:  Nothing Worsened by:  Liquids Associated symptoms: abdominal pain, chills, diarrhea, fever and myalgias   Associated symptoms: no cough   pt reports last weekend she ate out at twice, and both times ate hot dogs She reports she started to developed vomiting (nonbloody) as well as multiple profuse episodes of nonbloody diarrhea She also reports mild abdominal pain She reports fatigue No cp/sob No rash She had otherwise been well She has h/o arthritis and is on prednisone chronically and has been able to take the prednisone Past Medical History  Diagnosis Date  . A-fib   . Hypertension   . Arthritis    Past Surgical History  Procedure Laterality Date  . Back surgery    . Replacement total knee bilateral    . Cholecystectomy    . Abdominal hysterectomy     No family history on file. History  Substance Use Topics  . Smoking status: Former Games developer  . Smokeless tobacco: Not on file  . Alcohol Use: No   OB History   Grav Para Term Preterm Abortions TAB SAB Ect Mult Living                 Review of Systems  Constitutional: Positive for fever, chills and fatigue.  Respiratory: Negative for shortness of breath.   Cardiovascular: Negative for chest pain.  Gastrointestinal: Positive for vomiting, abdominal pain and diarrhea. Negative for blood in stool.  Musculoskeletal: Positive for myalgias. Negative for back pain.  Neurological: Positive for weakness.  All other systems reviewed and are negative.    Allergies  Penicillins  Home Medications  No current outpatient prescriptions on  file. BP 88/67  Pulse 110  Temp(Src) 100.3 F (37.9 C) (Oral)  Resp 22  SpO2 95% Initial BP when I was in room was systolic 79  Physical Exam CONSTITUTIONAL: elderly, no distress HEAD: Normocephalic/atraumatic EYES: EOMI/PERRL ENMT: Mucous membranes dry NECK: supple no meningeal signs SPINE:entire spine nontender CV: irregular, no murmurs/rubs/gallops LUNGS: coarse breath sounds noted bilaterally ABDOMEN: soft, nontender, no rebound or guarding GU:no cva tenderness Rectal - stool color brownish without blood/melena, chaperone present NEURO: Pt is awake/alert, moves all extremitiesx4 EXTREMITIES: pulses normal, full ROM SKIN: warm, color normal, no rash PSYCH: no abnormalities of mood noted  ED Course  Procedures   CRITICAL CARE Performed by: Joya Gaskins Total critical care time: 31 Critical care time was exclusive of separately billable procedures and treating other patients. Critical care was necessary to treat or prevent imminent or life-threatening deterioration. Critical care was time spent personally by me on the following activities: development of treatment plan with patient and/or surrogate as well as nursing, discussions with consultants, evaluation of patient's response to treatment, examination of patient, obtaining history from patient or surrogate, ordering and performing treatments and interventions, ordering and review of laboratory studies, ordering and review of radiographic studies, pulse oximetry and re-evaluation of patient's condition.  Labs Reviewed  CBC WITH DIFFERENTIAL - Abnormal; Notable for the following:    RBC 3.60 (*)    Hemoglobin 8.6 (*)    HCT 27.7 (*)  MCV 76.9 (*)    MCH 23.9 (*)    RDW 16.6 (*)    Lymphocytes Relative 9 (*)    Lymphs Abs 0.4 (*)    Monocytes Relative 14 (*)    All other components within normal limits  CULTURE, BLOOD (ROUTINE X 2)  CULTURE, BLOOD (ROUTINE X 2)  URINE CULTURE  BASIC METABOLIC PANEL   LIPASE, BLOOD  URINALYSIS, ROUTINE W REFLEX MICROSCOPIC  HEPATIC FUNCTION PANEL  CG4 I-STAT (LACTIC ACID)   2:18 PM Pt here with multiple episodes of vomiting/diarrhea for over 2 days She appears to have sepsis with fever, tachycardia, she is noted to be in atrial fibrillation with RVR She was also hypotensive (SBP 79 on my evaluation) IV fluids ordered  2:47 PM Check on patient several times Her BP is now improving (SBP 96) She is awake/alert and it appears her HR is improving She denies any new complaints  D/w dr Evlyn Kanner, who is on call for dr Eloise Harman He will admit and he will place bed request   MDM  Nursing notes including past medical history and social history reviewed and considered in documentation xrays reviewed and considered Labs/vital reviewed and considered     Date: 08/09/2012  Rate: 134  Rhythm: atrial fibrillation with RVR  QRS Axis: normal  Intervals: normal  ST/T Wave abnormalities: nonspecific ST changes  Conduction Disutrbances:none  Narrative Interpretation:   Old EKG Reviewed: h/o afib previously       Joya Gaskins, MD 08/09/12 [phone]

## 2012-08-09 NOTE — ED Notes (Signed)
[id number]<AV> Expected date:<BR> Expected time:<BR> Means of arrival:<BR> Comments:<BR> 72 y/o F NVD

## 2012-08-09 NOTE — ED Notes (Addendum)
Per EMS: pt c/o of fever and diarrhea since Sunday. Denies n/v today but did have some days prior. 1,000 po of tylenol and 250 of NS in 22 g in right hand.

## 2012-08-09 NOTE — H&P (Signed)
PCP:   Jarome Matin   Chief Complaint:  Nausea and vomiting  HPI:  72 YO WF, with hx steroid dependence, psoriatic arthitis. Started Sat night with loose stools. No blood or mucous and no abd pain. Started with some vomiting Sun night and Monday. Has had anorexia and continued loose stools but no more vomiting. Was weak and had fecal urgency this AM. Has some fever and chills for the first time last night and was up to 103 today. Still taking Rx, including prednisone. Now has muscle wall soreness but no frank abd pain. No current nausea. No CP or SOB. No aches or myalgias (over baseline). No HA. Globally weak and a bit dizzy when up No unusual travel or exposures, Other family members have eaten the same foods without illness.  No recent Abx exposure Feels a bit hungry  Review of Systems:  Review of Systems - Negative except as above Past Medical History: Past Medical History  Diagnosis Date  . A-fib   . Hypertension   . Arthritis   Leg Cramps HTN Exogenous obesity Psoriasis Osteoarthritis (bilateral hands and feet and lumbar spine, bilateral shoulders) 10/12 Ischium fracture, treated conservatively Adrenal Insufficiency (chronic prednisone treatment) Right Knee pain 2004 Gallstone Pancreatitis Chronic Low back Pain, 2005 MRI scan followed by surgery Fibromyalgia Osteopenia, 7/08 BMD T -2.4 2008 Paroxymal A-Fib DECLINES COLONOSCOPY  Physicians involved in care:  Houston (derm), Medoff (GI), Gioffre/ Rendall (ortho), Gerkin (gsurg), Nudelman (nsurg), Jenne Campus? (cards), Melvenia Needles (rheumatology), Avie Echevaria (neurology) Past Surgical History  Procedure Laterality Date  . Back surgery    . Replacement total knee bilateral    . Cholecystectomy    . Abdominal hysterectomy     TAH-1993 Liver Bx-1995, 1997, 2000-for Methotrexate Lap Chole-2004 L3, L4, L5 decompressive laminotomies and fusion at L 3/4 and L 4/5 levels-2005 Right TKR-01/2005 Left  TKR-05/2005 Cataract-OU-2007    Prior to Admission medications   Not on File  Complete Medication List: 1)  Meloxicam 15 Mg Tabs (Meloxicam) .... Take one tablet by mouth once daily as needed for pain 2)  Gabapentin 600 Mg Tabs (Gabapentin) .... Take 2 capsules by mouth every p.m. 3)  Lidoderm 5 % Ptch (Lidocaine) .... Apply one patch in the am and take off at bedtime 4)  Senokot S 8.6-50 Mg Tabs (Sennosides-docusate sodium) .... Take one tablet by mouth once daily as needed for constipation 5)  Cymbalta 30 Mg Cpep (Duloxetine hcl) .... Take one capsule by mouth once daily 6)  Fluoxetine Hcl 20 Mg Tabs (Fluoxetine hcl) .... Take one tablet by mouth once daily 7)  Aspirin 81 Mg Tabs (Aspirin) .... Take one tablet by mouth one time daily 8)  Miralax Powd (Polyethylene glycol 3350) .... Take as needed 9)  Cymbalta 60 Mg Cpep (Duloxetine hcl) .... Take one 60mg  and one 30mg  tablet every day for total of 90 10)  Diltiazem Hcl Cr 180 Mg Xr24h-cap (Diltiazem hcl) .... Take (1) capsule po qhs 11)  Enbrel 50 Mg/ml Soln (Etanercept) .... Qwk 12)  Flax Seed Oil 1000 Mg Caps (Flaxseed (linseed)) .... Take (1) capsule po daily 13)  Lasix 40 Mg Tabs (Furosemide) .... Take (1) tablet po daily 14)  Multivitamins Tabs (Multiple vitamin) .... Take one tablet by mouth every day 15)  Prednisone 5 Mg Tabs (Prednisone) .... Take (1) tablet po daily 16)  Estrace 2 Mg Tabs (Estradiol) .... Take on tablet by mouth every day 17)  Toprol Xl 50 Mg Xr24h-tab (Metoprolol succinate) .... Take 1-1/2  tablet po daily 18)  Ra Vitamin A & D 5000-400 Unit Caps (Vitamins a & d) .... Take (1) capsule po dialy 19)  Coenzyme Q10 100 Mg Caps (Coenzyme q10) .... Take one daily 20)  Biotin Maximum Strength 5000 Mcg Caps (Biotin) .... Take one daily 21)  Klor-con M20 20 Meq Cr-tabs (Potassium chloride crys cr) .... Take one daily 22)  Magnesium 250 Mg Tabs (Magnesium) .... Take one daily 23)  Prilosec 20 Mg Cpdr (Omeprazole) ....  Take one tablet once daily 24)  Feosol 200 (65 Fe) Mg Tabs (Ferrous sulfate dried) .... Take one tab twice daily 25)  Norco 5-325 Mg Tabs (Hydrocodone-acetaminophen) .... Take 1 to 2 tabs by mouth every p.m. 26)  Capsaicin 0.025 % Crea (Capsaicin) .... Apply to painful areas at least twice daily 27)  Vitamin D (ergocalciferol) 50000 Unit Caps (Ergocalciferol) .... Take one capsule once weekly   Allergies:   Allergies  Allergen Reactions  . Penicillins Other (See Comments)    Throat swelling     Social History:  reports that she quit smoking about 31 years ago. She has never used smokeless tobacco. She reports that she does not drink alcohol or use illicit drugs. 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 3 children, 1 daughter and 1 son living Daughter-Healthy ZOX:WRUE at age-19 CVA Hemorrhage-02-03-2000 AVW:[id number] Retired [employer] Disease Conltrol H/O tobacco use 25 ppy-quit in 1983 no H/O alcohol use   Family History:  Father:D-52 Brain Tumor Mother:D-55 Cirrhosis,ETOHic Brother:living 74  :    Physical Exam: Filed Vitals:   08/09/12 1500 08/09/12 1530 08/09/12 1534 08/09/12 1606  BP: 108/56 108/63  95/46  Pulse: 74 92  95  Temp:   98 F (36.7 C) 97.9 F (36.6 C)  TempSrc:   Oral Oral  Resp: 19 13  16   SpO2: 93% 97%  100%   General appearance: WF lying quietly in bed, non toxic and in no distress but a bit pale  sclera anicteric, face symmetric, oral membranes a bit dry Resp: clear with no wheeze, rale or rhonchi, no accessory ms in use Cardio: tachy and irreg GI: obese, mildly distended, soft, non-tender; bowel sounds a bit overactive but no rushes; no masses,  no organomegaly, no rebound Extremities: no edema Pulses: 2+ and symmetric Lymph nodes: Cervical adenopathy: no cervical lymphadenopathy Neurologic: Alert, awake, clear speech ,mentating well, grip equal bilaterally Skin warm dry without rashes.      Labs on Admission:   Recent Labs  08/09/12 1319  NA 135  K 2.8*  CL 102  CO2 20  GLUCOSE 118*  BUN 15  CREATININE 0.83  CALCIUM 8.1*    Recent Labs  08/09/12 1319  AST 12  ALT 10  ALKPHOS 83  BILITOT 0.2*  PROT 5.4*  ALBUMIN 2.3*    Recent Labs  08/09/12 1319  LIPASE 9*    Recent Labs  08/09/12 1319  WBC 4.7  NEUTROABS 3.6  HGB 8.6*  HCT 27.7*  MCV 76.9*  PLT 228        Radiological Exams on Admission: Dg Chest Port 1 View  08/09/2012   *RADIOLOGY REPORT*  Clinical Data: Fever and dehydration.  Atrial fibrillation.  PORTABLE CHEST - 1 VIEW  Comparison: 05/23/2006.  Findings: The cardiac silhouette, mediastinal and hilar contours are within normal limits and stable.  The lungs demonstrate chronic bronchitic changes but no acute overlying pulmonary process.  The bony thorax is intact.  Stable degenerative  changes involving both shoulders.  IMPRESSION: Mild chronic bronchitic changes but no acute overlying pulmonary process.   Original Report Authenticated By: Rudie Meyer, M.D.   Orders placed during the hospital encounter of 08/09/12  . ED EKG WANDERING PACEMAKER MULTIPLE PREMATURE COMPLEXES, VENT & SUPRAVEN        Assessment/Plan Principal Problem:   Nausea and vomiting: not overly toxic at the present. No leukocytosis and nl LFT.  BP was low upon arrival but has improved with fluids. Does not appear septic (nl WBC/LFT and normal mentation). No real RF"s for C diff (and WBC is low) but fever was impressive. Abd exam doesn't look like diverticulitis. Will cover with some Cipro for now Amylase to be checked Active Problems:   Adrenal insufficiency: could be some component of adrenal insuffic. Cover with SoluMedrol   Atrial fibrillation: HR is fast and irreg but not clearly in Afib. Check again if needed   Hypotension:improving with fluid   Dehydration: moderate ANEMIA: lower than her baseline and will likely drop with hydration. May need more  investigation. Looks Iron deficient by indices HYPERTENSION: Hold Rx this evening DEPRESSION: continue Rx FIBROMYALGIA: continue Rx GERD: continue Rx HYPOKALEMIA: replace   Randi Poullard ALAN 08/09/2012, 4:31 PM

## 2012-08-09 NOTE — Progress Notes (Signed)
Pt arrived from the ED alert and oriented, VSS at this time, afebrile, no c/o pain, NAD. Dr. Evlyn Kanner was notified of the pts arrival to the unit and his nurse in office stated that he would be here to see her and evaluate her himself in about an hour. I also let his nurse know that a code sepsis had been called on her in the ED and that an IV antibiotic did not get ordered in the one hour time frame by the EDP and to pass this on Dr.South that he could order this if he felt necessary. Rebecca,RN his RN stated that she would do this. Will continue to monitor the pt.

## 2012-08-10 LAB — URINE CULTURE: Colony Count: NO GROWTH

## 2012-08-10 LAB — COMPREHENSIVE METABOLIC PANEL
Albumin: 2 g/dL — ABNORMAL LOW (ref 3.5–5.2)
Alkaline Phosphatase: 75 U/L (ref 39–117)
BUN: 9 mg/dL (ref 6–23)
Calcium: 7.7 mg/dL — ABNORMAL LOW (ref 8.4–10.5)
Creatinine, Ser: 0.56 mg/dL (ref 0.50–1.10)
GFR calc Af Amer: >90 mL/min (ref >90)
Glucose, Bld: 130 mg/dL — ABNORMAL HIGH (ref 70–99)
Potassium: 3.9 mEq/L (ref 3.5–5.1)
Total Protein: 5.1 g/dL — ABNORMAL LOW (ref 6.0–8.3)

## 2012-08-10 LAB — IRON AND TIBC: UIBC: 320 ug/dL (ref 125–400)

## 2012-08-10 LAB — CBC
HCT: 25.1 % — ABNORMAL LOW (ref 36.0–46.0)
MCH: 24.6 pg — ABNORMAL LOW (ref 26.0–34.0)
MCHC: 31.5 g/dL (ref 30.0–36.0)
RDW: 16.6 % — ABNORMAL HIGH (ref 11.5–15.5)

## 2012-08-10 LAB — FERRITIN: Ferritin: 65 ng/mL (ref 10–291)

## 2012-08-10 LAB — CLOSTRIDIUM DIFFICILE BY PCR: Toxigenic C. Difficile by PCR: NEGATIVE

## 2012-08-10 LAB — VITAMIN B12: Vitamin B-12: 551 pg/mL (ref 211–911)

## 2012-08-10 MED ORDER — GABAPENTIN 400 MG PO CAPS
1200.0000 mg | ORAL_CAPSULE | Freq: Every day | ORAL | Status: DC
  Administered 2012-08-10 – 2012-08-11 (×2): 1200 mg via ORAL
  Filled 2012-08-10 (×3): qty 3

## 2012-08-10 MED ORDER — DULOXETINE HCL 60 MG PO CPEP
60.0000 mg | ORAL_CAPSULE | Freq: Every day | ORAL | Status: DC
  Administered 2012-08-11 – 2012-08-12 (×2): 60 mg via ORAL
  Filled 2012-08-10 (×2): qty 1

## 2012-08-10 MED ORDER — FUROSEMIDE 40 MG PO TABS
40.0000 mg | ORAL_TABLET | Freq: Every day | ORAL | Status: DC
  Administered 2012-08-10 – 2012-08-12 (×3): 40 mg via ORAL
  Filled 2012-08-10 (×4): qty 1

## 2012-08-10 MED ORDER — ESTRADIOL 2 MG PO TABS
2.0000 mg | ORAL_TABLET | Freq: Every day | ORAL | Status: DC
  Administered 2012-08-10 – 2012-08-12 (×3): 2 mg via ORAL
  Filled 2012-08-10 (×4): qty 1

## 2012-08-10 NOTE — Progress Notes (Addendum)
Physician Daily Progress Note  Subjective: Pt admitted on 7/16 evening w/ fevers to 103, N/V and diarrhea. HYdrated overnight and feeling much better No nausea Has had 1 BM w/ diarrhea, sent for c diff     Objective: Vital signs in last 24 hours: Temp:  [97.7 F (36.5 C)-100.3 F (37.9 C)] 97.8 F (36.6 C) (07/17 0520) Pulse Rate:  [74-124] 98 (07/17 0520) Resp:  [13-22] 18 (07/17 0520) BP: (88-123)/(42-67) 122/60 mmHg (07/17 0520) SpO2:  [93 %-100 %] 98 % (07/17 0520) Weight:  [175 lb 11.3 oz (79.7 kg)-176 lb (79.833 kg)] 175 lb 11.3 oz (79.7 kg) (07/17 0520) Weight change:  Last BM Date: 08/09/12  CBG (last 3)  No results found for this basename: GLUCAP,  in the last 72 hours  Intake/Output from previous day:  Intake/Output Summary (Last 24 hours) at 08/10/12 0740 Last data filed at 08/10/12 0100  Gross per 24 hour  Intake 847.92 ml  Output    200 ml  Net 647.92 ml   07/16 0701 - 07/17 0700 In: 847.9 [I.V.:847.9] Out: 200 [Urine:200]  Physical Exam General appearance: WF in NAD  Eyes: no scleral icterus, moist conjunctiva  Throat: oropharynx moist without erythema Resp: CTAB w/ wheezes or rlaes  Cardio: ir/ir, reg rate, no MRG  GI: soft, non-tender; bowel sounds normal; no masses,  no organomegaly Extremities: no clubbing, cyanosis. Very mild pitting edema B/L   Lab Results:  Recent Labs  08/09/12 1319 08/10/12 0500  NA 135 140  K 2.8* 3.9  CL 102 113*  CO2 20 18*  GLUCOSE 118* 130*  BUN 15 9  CREATININE 0.83 0.56  CALCIUM 8.1* 7.7*     Recent Labs  08/09/12 1319 08/10/12 0500  AST 12 16  ALT 10 10  ALKPHOS 83 75  BILITOT 0.2* 0.1*  PROT 5.4* 5.1*  ALBUMIN 2.3* 2.0*     Recent Labs  08/09/12 1319 08/10/12 0500  WBC 4.7 3.4*  NEUTROABS 3.6  --   HGB 8.6* 7.9*  HCT 27.7* 25.1*  MCV 76.9* 78.2  PLT 228 221    No results found for this basename: INR, PROTIME    No results found for this basename: CKTOTAL, CKMB, CKMBINDEX,  TROPONINI,  in the last 72 hours  No results found for this basename: TSH, T4TOTAL, FREET3, T3FREE, THYROIDAB,  in the last 72 hours   Recent Labs  08/09/12 1925  VITAMINB12 551  FOLATE >20.0  FERRITIN 65  TIBC Not calculated due to Iron <10.  IRON <10*  RETICCTPCT 1.2    Micro Results: No results found for this or any previous visit (from the past 240 hour(s)).  Studies/Results: Dg Chest Port 1 View  08/09/2012   *RADIOLOGY REPORT*  Clinical Data: Fever and dehydration.  Atrial fibrillation.  PORTABLE CHEST - 1 VIEW  Comparison: 05/23/2006.  Findings: The cardiac silhouette, mediastinal and hilar contours are within normal limits and stable.  The lungs demonstrate chronic bronchitic changes but no acute overlying pulmonary process.  The bony thorax is intact.  Stable degenerative changes involving both shoulders.  IMPRESSION: Mild chronic bronchitic changes but no acute overlying pulmonary process.   Original Report Authenticated By: Rudie Meyer, M.D.     Medications: Scheduled: . aspirin EC  81 mg Oral QHS  . ciprofloxacin  400 mg Intravenous Q12H  . diltiazem  180 mg Oral QHS  . [START ON 08/11/2012] DULoxetine  60 mg Oral Daily  . enoxaparin (LOVENOX) injection  40 mg Subcutaneous Q24H  .  gabapentin  600 mg Oral BID  . methylPREDNISolone (SOLU-MEDROL) injection  60 mg Intravenous Q6H  . pantoprazole  40 mg Oral Daily  . potassium chloride  40 mEq Oral BID   Continuous: . sodium chloride 125 mL/hr at 08/10/12 0557    Assessment/Plan:   # SIRS w/ nausea and emesis: BP responded greatly to IVF. Now normotensive. Stopping IVF this AM. Encouraging PO intake. Blood and urine cultures pending. C diff pending. Patient being emperically covered with cipro IV at this time pending culture results. CXR, u/a normal. Amylase/lipase normal. Source remains unclear.   Active Problems:  #Adrenal insufficiency: could be some component of adrenal insuffic. Covering with stress dose  SoluMedrol at this time. May deescalate to 40mg  IV daily if still improving clinically.  #Atrial fibrillation: was increased rate, but this responded well to IVF and dilt PO (home med) #ANEMIA, iron def: tranfuse for Hgb < 7. Heme stool neg. SPEP pending as well. Would like benefit from dose of feraheme prior to discharge, but I will hold on this until further culture analysis is negative, as an active infection would be contraindication to IV iron supplementation.  #HYPERTENSION: on home dilt and lasix. Only remaining med to add back is metoprolol XL 25-50mg  daily  #DEPRESSION: continue cymbalta but at 60mg  daily instead of 90mg  daily while she is on cipro, given drug interaction on clearance rates #FIBROMYALGIA: continue Rx  #GERD: continue Rx  #HYPOKALEMIA: normalized. On home potassium supplements  #Dispo : pending source of this infection. Need 48 hours culture data before making decision. C diff pending. Earliest d/c is Saturday.  #PPx: PPI and lovenox          LOS: 1 day   Devereaux Grayson 08/10/2012, 7:40 AM

## 2012-08-10 NOTE — Care Management Note (Addendum)
    Page 1 of 1   08/13/2012     4:52:12 PM   CARE MANAGEMENT NOTE 08/13/2012  Patient:  Andrea Yu, Andrea Yu   Account Number:  [id number]  Date Initiated:  08/10/2012  Documentation initiated by:  [facility]  Subjective/Objective Assessment:   ADMITTED W/FEVER/N/V/D.     Action/Plan:   FROM HOME.   Anticipated DC Date:  08/12/2012   Anticipated DC Plan:  HOME/SELF CARE      DC Planning Services  CM consult      Choice offered to / List presented to:             Status of service:  Completed, signed off Medicare Important Message given?   (If response is "NO", the following Medicare IM given date fields will be blank) Date Medicare IM given:   Date Additional Medicare IM given:    Discharge Disposition:  HOME/SELF CARE  Per UR Regulation:  Reviewed for med. necessity/level of care/duration of stay  If discussed at Long Length of Stay Meetings, dates discussed:    Comments:  08/10/12 [facility] RN,BSN NCM [phone]

## 2012-08-10 NOTE — Progress Notes (Signed)
Nutrition Brief Note  Patient identified on the Malnutrition Screening Tool (MST) Report  Body mass index is 35.47 kg/(m^2). Patient meets criteria for Obese based on current BMI.   Current diet order is Heart Healthy, patient is consuming approximately 100% of meals at this time. Pt reports that she was eating well the past few days and lost 5 lbs but, she was eating very well prior to symptoms starting. Pt reports that her appetite is improving and she is eating better today; she thinks she will gain the 5 lbs back in no time. Labs and medications reviewed.   No nutrition interventions warranted at this time. If nutrition issues arise, please consult RD.   Ian Malkin RD, LDN Inpatient Clinical Dietitian Pager: [phone] After Hours Pager: [phone]

## 2012-08-11 LAB — COMPREHENSIVE METABOLIC PANEL
AST: 12 U/L (ref 0–37)
CO2: 20 mEq/L (ref 19–32)
Calcium: 8.4 mg/dL (ref 8.4–10.5)
Chloride: 113 mEq/L — ABNORMAL HIGH (ref 96–112)
Creatinine, Ser: 0.69 mg/dL (ref 0.50–1.10)
GFR calc Af Amer: >90 mL/min (ref >90)
GFR calc non Af Amer: 84 mL/min — ABNORMAL LOW (ref >90)
Glucose, Bld: 126 mg/dL — ABNORMAL HIGH (ref 70–99)
Total Bilirubin: 0.1 mg/dL — ABNORMAL LOW (ref 0.3–1.2)

## 2012-08-11 LAB — CBC
HCT: 25.7 % — ABNORMAL LOW (ref 36.0–46.0)
Hemoglobin: 8.1 g/dL — ABNORMAL LOW (ref 12.0–15.0)
MCH: 24.4 pg — ABNORMAL LOW (ref 26.0–34.0)
MCV: 77.4 fL — ABNORMAL LOW (ref 78.0–100.0)
Platelets: 264 10*3/uL (ref 150–400)
RBC: 3.32 MIL/uL — ABNORMAL LOW (ref 3.87–5.11)
WBC: 6.9 10*3/uL (ref 4.0–10.5)

## 2012-08-11 MED ORDER — METOPROLOL SUCCINATE ER 50 MG PO TB24
75.0000 mg | ORAL_TABLET | Freq: Every day | ORAL | Status: DC
  Administered 2012-08-11 – 2012-08-12 (×2): 75 mg via ORAL
  Filled 2012-08-11 (×2): qty 1

## 2012-08-11 MED ORDER — PREDNISONE 20 MG PO TABS
40.0000 mg | ORAL_TABLET | Freq: Every day | ORAL | Status: DC
  Administered 2012-08-12: 40 mg via ORAL
  Filled 2012-08-11 (×2): qty 2

## 2012-08-11 MED ORDER — METOPROLOL SUCCINATE ER 25 MG PO TB24
25.0000 mg | ORAL_TABLET | Freq: Every day | ORAL | Status: DC
  Filled 2012-08-11: qty 1

## 2012-08-11 NOTE — Progress Notes (Addendum)
Physician Daily Progress Note  Subjective: Pt admitted on 7/16 evening w/ hypotension, fevers to 103, N/V and diarrhea. Had some tachycardia w/ activity overnight. Stools are improving to normal. No other events     Objective: Vital signs in last 24 hours: Temp:  [97.9 F (36.6 C)-98.1 F (36.7 C)] 97.9 F (36.6 C) (07/18 0609) Pulse Rate:  [75-90] 75 (07/18 0609) Resp:  [18] 18 (07/18 0609) BP: (119-171)/(52-68) 131/68 mmHg (07/18 0609) SpO2:  [98 %-100 %] 100 % (07/18 0609) Weight:  [175 lb 4.3 oz (79.5 kg)] 175 lb 4.3 oz (79.5 kg) (07/18 0609) Weight change: -11.8 oz (-0.333 kg) Last BM Date: 08/10/12  CBG (last 3)  No results found for this basename: GLUCAP,  in the last 72 hours  Intake/Output from previous day:  Intake/Output Summary (Last 24 hours) at 08/11/12 1104 Last data filed at 08/11/12 0758  Gross per 24 hour  Intake   1630 ml  Output    100 ml  Net   1530 ml   07/17 0701 - 07/18 0700 In: 1400 [P.O.:720; I.V.:250; IV Piggyback:400] Out: 100 [Urine:100]  Physical Exam General appearance: WF in NAD  Eyes: no scleral icterus, moist conjunctiva  Throat: oropharynx moist without erythema Resp: CTAB w/ wheezes or rlaes  Cardio: ir/ir, reg rate, no MRG  GI: soft, non-tender; bowel sounds normal; no masses,  no organomegaly Extremities: no clubbing, cyanosis. Very mild pitting edema B/L   Lab Results:  Recent Labs  08/10/12 0500 08/11/12 0510  NA 140 142  K 3.9 3.8  CL 113* 113*  CO2 18* 20  GLUCOSE 130* 126*  BUN 9 7  CREATININE 0.56 0.69  CALCIUM 7.7* 8.4     Recent Labs  08/10/12 0500 08/11/12 0510  AST 16 12  ALT 10 11  ALKPHOS 75 70  BILITOT 0.1* 0.1*  PROT 5.1* 5.3*  ALBUMIN 2.0* 2.3*     Recent Labs  08/09/12 1319 08/10/12 0500 08/11/12 0510  WBC 4.7 3.4* 6.9  NEUTROABS 3.6  --   --   HGB 8.6* 7.9* 8.1*  HCT 27.7* 25.1* 25.7*  MCV 76.9* 78.2 77.4*  PLT 228 221 264    No results found for this basename: INR,   PROTIME    No results found for this basename: CKTOTAL, CKMB, CKMBINDEX, TROPONINI,  in the last 72 hours  No results found for this basename: TSH, T4TOTAL, FREET3, T3FREE, THYROIDAB,  in the last 72 hours   Recent Labs  08/09/12 1925  VITAMINB12 551  FOLATE >20.0  FERRITIN 65  TIBC Not calculated due to Iron <10.  IRON <10*  RETICCTPCT 1.2    Micro Results: Recent Results (from the past 240 hour(s))  CULTURE, BLOOD (ROUTINE X 2)     Status: None   Collection Time    08/09/12  1:19 PM      Result Value Range Status   Specimen Description BLOOD LEFT HAND   Final   Special Requests BOTTLES DRAWN AEROBIC AND ANAEROBIC   Final   Culture  Setup Time 08/09/2012 16:02   Final   Culture     Final   Value:        BLOOD CULTURE RECEIVED NO GROWTH TO DATE CULTURE WILL BE HELD FOR 5 DAYS BEFORE ISSUING A FINAL NEGATIVE REPORT   Report Status PENDING   Incomplete  CULTURE, BLOOD (ROUTINE X 2)     Status: None   Collection Time    08/09/12  1:30 PM  Result Value Range Status   Specimen Description BLOOD LEFT WRIST   Final   Special Requests BOTTLES DRAWN AEROBIC AND ANAEROBIC 5CC   Final   Culture  Setup Time 08/09/2012 16:02   Final   Culture     Final   Value:        BLOOD CULTURE RECEIVED NO GROWTH TO DATE CULTURE WILL BE HELD FOR 5 DAYS BEFORE ISSUING A FINAL NEGATIVE REPORT   Report Status PENDING   Incomplete  URINE CULTURE     Status: None   Collection Time    08/09/12  3:42 PM      Result Value Range Status   Specimen Description URINE, RANDOM   Final   Special Requests NONE   Final   Culture  Setup Time 08/10/2012 00:49   Final   Colony Count NO GROWTH   Final   Culture NO GROWTH   Final   Report Status 08/10/2012 FINAL   Final  CLOSTRIDIUM DIFFICILE BY PCR     Status: None   Collection Time    08/10/12  1:15 AM      Result Value Range Status   C difficile by pcr NEGATIVE  NEGATIVE Final    Studies/Results: Dg Chest Port 1 View  08/09/2012   *RADIOLOGY  REPORT*  Clinical Data: Fever and dehydration.  Atrial fibrillation.  PORTABLE CHEST - 1 VIEW  Comparison: 05/23/2006.  Findings: The cardiac silhouette, mediastinal and hilar contours are within normal limits and stable.  The lungs demonstrate chronic bronchitic changes but no acute overlying pulmonary process.  The bony thorax is intact.  Stable degenerative changes involving both shoulders.  IMPRESSION: Mild chronic bronchitic changes but no acute overlying pulmonary process.   Original Report Authenticated By: Rudie Meyer, M.D.     Medications: Scheduled: . aspirin EC  81 mg Oral QHS  . ciprofloxacin  400 mg Intravenous Q12H  . diltiazem  180 mg Oral QHS  . DULoxetine  60 mg Oral Daily  . enoxaparin (LOVENOX) injection  40 mg Subcutaneous Q24H  . estradiol  2 mg Oral Daily  . furosemide  40 mg Oral Q breakfast  . gabapentin  1,200 mg Oral QHS  . metoprolol succinate  25 mg Oral Daily  . pantoprazole  40 mg Oral Daily  . potassium chloride  40 mEq Oral BID  . [START ON 08/12/2012] predniSONE  40 mg Oral Q breakfast   Continuous:    Assessment/Plan:   # SIRS w/ nausea and emesis: stabilized and now only on PO intake, no IVF. On cipro IV day 2. Blood cultures pending, neg x 36 hours. Urine cx negative. C diff negative. Patient being emperically covered with cipro IV at this time pending culture results. CXR, u/a normal. Amylase/lipase normal. Source remains unclear. Deescalating steroids from solumedrol 60mg  q6 hours to prednisone 40mg  daily in the morning. I question whether this fever may have been a/w psoriatic flare that improved w/ the stress dose steroids?? All infectious workup negative....   Active Problems:  # psoriatic arthritis : will deescalate steroids to prednisone 40mg  daily in AM and will need  taper after discharge back to 5mg  daily. PCP may want to consider increasing her prednisone at baseline, given this could have been a flare. Will defer to PCP for this management.   #Adrenal insufficiency: could have been some component of adrenal insuffic.  Deescalating steroids from solumedrol 60mg  q6 hours to prednisone 40mg  daily in the morning  #Atrial fibrillation: was increased rate,  but this responded well to IVF and dilt PO (home med). Resuming home BB today.  #ANEMIA, iron def: Would like benefit from dose of feraheme prior to discharge, but I will hold on this until further culture analysis is negative x at least 48 hours, as an active infection would be contraindication to IV iron supplementation.  tranfuse for Hgb < 7. Heme stool neg. SPEP pending.  #HYPERTENSION: on home dilt and lasix. Resuming home metoprolol XL 75mg  daily today  #DEPRESSION: continue cymbalta but at 60mg  daily instead of 90mg  daily while she is on cipro, given drug interaction on clearance rates #FIBROMYALGIA: continue Rx  #GERD: continue Rx  #HYPOKALEMIA: normalized. On home potassium supplements  #Dispo :Saturday morning will be 48 hours of culture data, and if afebrile, 48 hours w/o fever. Could consider d/c home after this 48 hours of monitoring if cultures negative and vital stable.  Would consider empirically covering what I would assume is enteric process w/ PO cipro x 7-10 days. Will also need tapering dose of steroids back to her home dose of 5mg  daily and close PCP f/u.  #PPx: PPI and lovenox          LOS: 2 days   Maliah Pyles 08/11/2012, 11:04 AM

## 2012-08-12 LAB — CBC
Hemoglobin: 8.7 g/dL — ABNORMAL LOW (ref 12.0–15.0)
MCV: 77.6 fL — ABNORMAL LOW (ref 78.0–100.0)
RBC: 3.62 MIL/uL — ABNORMAL LOW (ref 3.87–5.11)
RDW: 16.9 % — ABNORMAL HIGH (ref 11.5–15.5)

## 2012-08-12 LAB — COMPREHENSIVE METABOLIC PANEL
Albumin: 2.6 g/dL — ABNORMAL LOW (ref 3.5–5.2)
BUN: 13 mg/dL (ref 6–23)
Chloride: 106 mEq/L (ref 96–112)
Creatinine, Ser: 0.69 mg/dL (ref 0.50–1.10)
GFR calc Af Amer: >90 mL/min (ref >90)
GFR calc non Af Amer: 84 mL/min — ABNORMAL LOW (ref >90)
Glucose, Bld: 101 mg/dL — ABNORMAL HIGH (ref 70–99)
Total Bilirubin: 0.2 mg/dL — ABNORMAL LOW (ref 0.3–1.2)

## 2012-08-12 MED ORDER — PREDNISONE 5 MG PO TABS: 20.0000 mg | ORAL_TABLET | Freq: Every day | ORAL | Status: DC

## 2012-08-12 MED ORDER — POTASSIUM CHLORIDE CRYS ER 20 MEQ PO TBCR
20.0000 meq | EXTENDED_RELEASE_TABLET | Freq: Every day | ORAL | Status: DC
  Filled 2012-08-12: qty 1

## 2012-08-12 MED ORDER — CIPROFLOXACIN HCL 250 MG PO TABS: 250.0000 mg | ORAL_TABLET | Freq: Two times a day (BID) | ORAL | Status: DC

## 2012-08-12 MED ORDER — FLUCONAZOLE 150 MG PO TABS: 150.0000 mg | ORAL_TABLET | Freq: Once | ORAL | Status: DC

## 2012-08-12 NOTE — Discharge Summary (Signed)
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     BIOTIN 5000 PO  Take 1 tablet by mouth at bedtime.     celecoxib 200 MG capsule  Commonly known as:  CELEBREX  Take 200 mg by mouth at bedtime.     ciprofloxacin 250 MG tablet  Commonly known as:  CIPRO  Take 1 tablet (250 mg total) by mouth 2 (two) times daily.     CoQ-10 100 MG Caps  Take 1 capsule by mouth daily.     diltiazem 180 MG 24 hr capsule  Commonly known as:  CARDIZEM CD  Take 180 mg by mouth at bedtime.     DULoxetine 30 MG capsule  Commonly known as:  CYMBALTA  Take 30 mg by mouth daily. Take along with 60mg  to make a total of 90mg      DULoxetine 60 MG capsule  Commonly known as:  CYMBALTA  Take 60 mg by mouth daily. Take along with 30mg  capsule to make a total of 90mg      etanercept 50 MG/ML injection  Commonly known as:  ENBREL  Inject 50 mg into the skin once a week. Tuesday     FLAXSEED (LINSEED) PO  Take 1,200 mg by mouth daily.     fluconazole 150 MG tablet  Commonly known as:  DIFLUCAN  Take 1 tablet (150 mg total) by mouth once.     furosemide 40 MG tablet  Commonly known as:  LASIX  Take 40 mg by mouth daily.     gabapentin 600 MG tablet  Commonly known as:  NEURONTIN  Take 600 mg by mouth 2 (two) times daily.     HYDROcodone-acetaminophen 5-325 MG per tablet  Commonly known as:  NORCO/VICODIN  Take 1  tablet by mouth every 6 (six) hours as needed for pain.     Magnesium 250 MG Tabs  Take 1 tablet by mouth daily.     meloxicam 15 MG tablet  Commonly known as:  MOBIC  Take 15 mg by mouth daily.     metoprolol succinate 25 MG 24 hr tablet  Commonly known as:  TOPROL-XL  Take 25 mg by mouth daily. Take along with 50mg  tablet to make a total of 75mg      metoprolol succinate 50 MG 24 hr tablet  Commonly known as:  TOPROL-XL  Take 50 mg by mouth daily. Take along with 25mg  to make a total of 75mg . Take with or immediately following a meal.     multivitamin with minerals Tabs  Take 1 tablet by mouth daily.     omeprazole 20 MG capsule  Commonly known as:  PRILOSEC  Take 20 mg by mouth daily.     potassium chloride SA 20 MEQ tablet  Commonly known as:  K-DUR,KLOR-CON  Take 20 mEq by mouth at bedtime.     predniSONE 5 MG tablet  Commonly known as:  DELTASONE  Take 4 tablets (20 mg total) by mouth daily.     vitamin C 100 MG tablet  Take 100 mg by mouth at bedtime.     Vitamin D (Ergocalciferol) 50000 UNITS Caps  Commonly known as:  DRISDOL  Take 50,000 Units by mouth every 7 (seven) days. Tuesday        Hospital Procedures: Dg Chest Port 1 View  08/09/2012   *RADIOLOGY REPORT*  Clinical Data: Fever and dehydration.  Atrial fibrillation.  PORTABLE CHEST - 1 VIEW  Comparison: 05/23/2006.  Findings: The cardiac silhouette, mediastinal and hilar contours are within normal limits and stable.  The lungs demonstrate chronic bronchitic changes but no acute overlying pulmonary process.  The bony thorax is intact.  Stable degenerative changes involving both shoulders.  IMPRESSION: Mild chronic bronchitic changes but no acute overlying pulmonary process.   Original Report Authenticated By: Rudie Meyer, M.D.    History of Present Illness: Patient is a 72 year old female with a history of chronic steroid usage who came to the ER feeling ill with profound hypotension.  Hospital  Course: Mrs. Gose was initiated on IV fluids and antibiotics and given stress dose steroids.  She had initial fevers reported to 29 F.  Blood culture and urine cultures are negative.  She was somewhat dehydrated due to preceeding nausea and vomiting.  Electrolytes were repleted.  She responded well to high dose steroids and was reduced to 40mg  daily prior to discharge.  On day of discharge, eating and drinking well and feels like she is approaching baseline.  Expressed no concerns about returning home.  Day of Discharge Exam BP 140/63  Pulse 84  Temp(Src) 97.8 F (36.6 C) (Oral)  Resp 16  Ht 4\' 11"  (1.499 m)  Wt 84.55 kg (186 lb 6.4 oz)  BMI 37.63 kg/m2  SpO2 100%  Physical Exam: General appearance: WF in NAD  Eyes: no scleral icterus, moist conjunctiva  Throat: oropharynx moist without erythema  Resp: CTAB w/ wheezes or rlaes  Cardio: ir/ir, reg rate, no MRG  Known Afib GI: soft, non-tender; bowel sounds normal; no masses, no organomegaly  Extremities: no clubbing, cyanosis. Very mild pitting edema bilaterally  Discharge Labs:  Recent Labs  08/11/12 0510 08/12/12 0626  NA 142 140  K 3.8 4.0  CL 113* 106  CO2 20 23  GLUCOSE 126* 101*  BUN 7 13  CREATININE 0.69 0.69  CALCIUM 8.4 9.0    Recent Labs  08/11/12 0510 08/12/12 0626  AST 12 23  ALT 11 18  ALKPHOS 70 76  BILITOT 0.1* 0.2*  PROT 5.3* 5.8*  ALBUMIN 2.3* 2.6*    Recent Labs  08/09/12 1319  08/11/12 0510 08/12/12 0626  WBC 4.7  < > 6.9 9.4  NEUTROABS 3.6  --   --   --   HGB 8.6*  < > 8.1* 8.7*  HCT 27.7*  < > 25.7* 28.1*  MCV 76.9*  < > 77.4* 77.6*  PLT 228  < > 264 320  < > = values in this interval not displayed.  Recent Labs  08/09/12 1925  VITAMINB12 551  FOLATE >20.0  FERRITIN 65  TIBC Not calculated due to Iron <10.  IRON <10*  RETICCTPCT 1.2    Discharge instructions:  Continue t opush fluids, take meds as prescribed.   Disposition: Home in stable condition Follow-up  Appts: Follow-up with Dr. Donne Anon at [facility] next week.  His nurse, DJ, will contact on Monday.  Signed:  Charish Schroepfer W 08/12/2012, 8:38 AM

## 2012-08-14 LAB — PROTEIN ELECTROPHORESIS, SERUM
Albumin ELP: 48.3 % — ABNORMAL LOW (ref 55.8–66.1)
Alpha-1-Globulin: 10.7 % — ABNORMAL HIGH (ref 2.9–4.9)
Alpha-2-Globulin: 15.1 % — ABNORMAL HIGH (ref 7.1–11.8)
Beta Globulin: 9.1 % — ABNORMAL HIGH (ref 4.7–7.2)
Total Protein ELP: 4.7 g/dL — ABNORMAL LOW (ref 6.0–8.3)

## 2012-08-15 LAB — CULTURE, BLOOD (ROUTINE X 2): Culture: NO GROWTH

## 2012-08-30 ENCOUNTER — Other Ambulatory Visit: Payer: Self-pay

## 2012-10-20 ENCOUNTER — Encounter: Payer: Self-pay | Admitting: Cardiovascular Disease

## 2012-10-20 ENCOUNTER — Ambulatory Visit (INDEPENDENT_AMBULATORY_CARE_PROVIDER_SITE_OTHER): Payer: Medicare Other | Admitting: Cardiovascular Disease

## 2012-10-20 VITALS — BP 120/62 | HR 103 | Ht 59.0 in | Wt 190.4 lb

## 2012-10-20 DIAGNOSIS — I4891 Unspecified atrial fibrillation: Secondary | ICD-10-CM

## 2012-10-20 NOTE — Patient Instructions (Addendum)
Increase metoprolol succinate to a total of 125 mg daily. Stop aspirin. Continue Eliquis. as prescribed. It should be stopped 24 hours before a minor surgical procedure (for example endoscopy) in 48 hours before a major surgical procedure.  Your physician has requested that you have an echocardiogram. Echocardiography is a painless test that uses sound waves to create images of your heart. It provides your doctor with information about the size and shape of your heart and how well your heart's chambers and valves are working. This procedure takes approximately one hour. There are no restrictions for this procedure.  Followup with Dr. Loreta Ave as you have scheduled, for evaluation of anemia. Return for followup in 3-4 weeks to discuss electrical cardioversion. In order to safely perform a cardioversion we have to have uninterrupted treatment with anticoagulation for 3 weeks before the procedure and 4 weeks after the procedure.

## 2012-10-24 NOTE — Assessment & Plan Note (Addendum)
It appears clear that Andrea Yu's atrial fibrillation is not only associated with periods of hyperadrenergic state, but is occurring randomly. She is oblivious to the palpitations and therefore cannot give [place] a reliable reporter the frequency or burden of atrial fibrillation. Her current medications are doing a good job of rate control. I agree with the decision to place her on full anticoagulation therapy since her CHADSVasc score is elevated. We discussed the pros and cons of treatment with a short acting novel anticoagulant such as Eliquis. Therefore compliance is critical since the medication wears off quickly and she'll be quickly unprotected. On the other hand a short action is beneficial if she requires surgical procedures or has bleeding complications. It sounds like she really requires treatment with nonsteroidal anti-inflammatory drugs in order to maintain mobility. I did mention that Celebrex increases the risk of gastrointestinal bleeding (although it may be better than other NSAIDs). On the other hand I do not see a good indication for her to continue aspirin and I have recommended that she stop this to reduce risk of bleeding. It is not clear at this time whether she has paroxysmal atrial fibrillation, persistent atrial fibrillation or maybe has even settled into a pattern of permanent atrial fibrillation. She does appear to be fatigued and has some dyspnea compared with months past. She may benefit from cardioversion and returned to normal rhythm. Have told him that if we decided to perform a cardioversion we will have to have uninterrupted anticoagulation for 3 weeks before that procedure in 4 weeks following. Have suggested that she have her anemia workup with Dr. Loreta Ave the first. Depending on the results of that study we will decide whether it is safe to proceed with a cardioversion. I would be particularly worried if we have to stop anticoagulation abruptly in the first month after return to  normal rhythm. Also ordered an echocardiogram which will help guide [place] towards the likelihood of maintenance of normal rhythm and the need for antiarrhythmic therapy in the long term.

## 2012-10-24 NOTE — Progress Notes (Signed)
Patient ID: Andrea Yu, female   DOB: 11-21-1940, 72 y.o.   MRN: [id number]     Reason for office visit Recurrent atrial fibrillation  Mrs. Sawyers was incidentally noted to have atrial fibrillation during an office evaluation with Dr. Eloise Harman. She has had 2 previous episodes of atrial fibrillation, both of them associated with intercurrent illnesses (once with pneumonia, the second time with an episode of severe dehydration). She has psoriatic arthritis on chronic treatment with Enbrel. She also has hypertension, but does not have diabetes mellitus or congestive heart failure. There is a strong family history of breast cancer lung cancer and brain cancer. She has unexplained microcytic anemia, presumably iron deficiency; note is made of marked hypoalbuminemia of uncertain etiology    Allergies  Allergen Reactions  . Penicillins Other (See Comments)    Throat swelling     Current Outpatient Prescriptions  Medication Sig Dispense Refill  . Ascorbic Acid (VITAMIN C) 100 MG tablet Take 100 mg by mouth at bedtime.      Marland Kitchen aspirin EC 81 MG tablet Take 81 mg by mouth at bedtime.      Marland Kitchen BIOTIN 5000 PO Take 1 tablet by mouth at bedtime.      . celecoxib (CELEBREX) 200 MG capsule Take 200 mg by mouth at bedtime.      . Coenzyme Q10 (COQ-10) 100 MG CAPS Take 1 capsule by mouth daily.      Marland Kitchen diltiazem (CARDIZEM CD) 180 MG 24 hr capsule Take 180 mg by mouth at bedtime.      . DULoxetine (CYMBALTA) 30 MG capsule Take 30 mg by mouth daily. Take along with 60mg  to make a total of 90mg       . DULoxetine (CYMBALTA) 60 MG capsule Take 60 mg by mouth daily. Take along with 30mg  capsule to make a total of 90mg       . etanercept (ENBREL) 50 MG/ML injection Inject 50 mg into the skin once a week. Tuesday      . ferrous sulfate 325 (65 FE) MG EC tablet Take 325 mg by mouth daily with breakfast.      . FLAXSEED, LINSEED, PO Take 1,200 mg by mouth daily.      . furosemide (LASIX) 40 MG tablet Take 40 mg by  mouth daily.      Marland Kitchen gabapentin (NEURONTIN) 600 MG tablet Take 600 mg by mouth 2 (two) times daily.      . Magnesium 250 MG TABS Take 1 tablet by mouth daily.      . metoprolol succinate (TOPROL-XL) 50 MG 24 hr tablet Take 100 mg by mouth daily. Take along with 25mg  to make a total of 75mg . Take with or immediately following a meal.      . Multiple Vitamin (MULTIVITAMIN WITH MINERALS) TABS Take 1 tablet by mouth daily.      Marland Kitchen omeprazole (PRILOSEC) 20 MG capsule Take 20 mg by mouth daily.      . potassium chloride SA (K-DUR,KLOR-CON) 20 MEQ tablet Take 40 mEq by mouth at bedtime.       . predniSONE (DELTASONE) 5 MG tablet Take 10 mg by mouth daily.      . Vitamin D, Ergocalciferol, (DRISDOL) 50000 UNITS CAPS Take 50,000 Units by mouth every 7 (seven) days. Tuesday      . ELIQUIS 5 MG TABS tablet Take 5 mg by mouth 2 (two) times daily.      Marland Kitchen HYDROcodone-acetaminophen (NORCO/VICODIN) 5-325 MG per tablet Take 1 tablet by mouth  every 6 (six) hours as needed for pain.       No current facility-administered medications for this visit.    Past Medical History  Diagnosis Date  . A-fib   . Hypertension   . Arthritis   . Peripheral neuropathy     Past Surgical History  Procedure Laterality Date  . Back surgery    . Replacement total knee bilateral    . Cholecystectomy    . Abdominal hysterectomy      No family history on file.  History   Social History  . Marital Status: Married    Spouse Name: N/A    Number of Children: N/A  . Years of Education: N/A   Occupational History  . Not on file.   Social History Main Topics  . Smoking status: Former Smoker -- 30 years    Quit date: 08/09/1981  . Smokeless tobacco: Never Used  . Alcohol Use: No  . Drug Use: No  . Sexual Activity: Not on file   Other Topics Concern  . Not on file   Social History Narrative  . No narrative on file    Review of systems: The patient specifically denies any chest pain at rest or with exertion,  dyspnea at rest, orthopnea, paroxysmal nocturnal dyspnea, syncope, palpitations, focal neurological deficits, intermittent claudication, lower extremity edema, unexplained weight gain, cough, hemoptysis or wheezing.  The patient also denies abdominal pain, nausea, vomiting, dysphagia, diarrhea, constipation, polyuria, polydipsia, dysuria, hematuria, frequency, urgency, abnormal bleeding or bruising, fever, chills, unexpected weight changes, mood swings, change in skin or hair texture, change in voice quality, auditory or visual problems, allergic reactions or rashes, new musculoskeletal complaints other than usual "aches and pains".   PHYSICAL EXAM BP 120/62  Pulse 103  Ht 4\' 11"  (1.499 m)  Wt 190 lb 6.4 oz (86.365 kg)  BMI 38.44 kg/m2 * General: Alert, oriented x3, no distress Head: no evidence of trauma, PERRL, EOMI, no exophtalmos or lid lag, no myxedema, no xanthelasma; normal ears, nose and oropharynx Neck: normal jugular venous pulsations and no hepatojugular reflux; brisk carotid pulses without delay and no carotid bruits Chest: clear to auscultation, no signs of consolidation by percussion or palpation, normal fremitus, symmetrical and full respiratory excursions Cardiovascular: normal position and quality of the apical impulse, irregular rhythm, normal first and second heart sounds, no murmurs, rubs or gallops Abdomen: no tenderness or distention, no masses by palpation, no abnormal pulsatility or arterial bruits, normal bowel sounds, no hepatosplenomegaly Extremities: no clubbing, cyanosis or edema; 2+ radial, ulnar and brachial pulses bilaterally; 2+ right femoral, posterior tibial and dorsalis pedis pulses; 2+ left femoral, posterior tibial and dorsalis pedis pulses; no subclavian or femoral bruits Neurological: grossly nonfocal Skin: Psoriatic lesions especially seen on the extensor surfaces of her forearms and knees. **  EKG: Atrial fibrillation nonspecific ST-T changes  Lipid  Panel  No results found for this basename: chol, trig, hdl, cholhdl, vldl, ldlcalc    BMET    Component Value Date/Time   NA 140 08/12/2012 0626   K 4.0 08/12/2012 0626   CL 106 08/12/2012 0626   CO2 23 08/12/2012 0626   GLUCOSE 101* 08/12/2012 0626   BUN 13 08/12/2012 0626   CREATININE 0.69 08/12/2012 0626   CALCIUM 9.0 08/12/2012 0626   GFRNONAA 84* 08/12/2012 0626   GFRAA >90 08/12/2012 0626     ASSESSMENT AND PLAN Atrial fibrillation It appears clear that Mrs. Housholder's atrial fibrillation is not only associated with periods of hyperadrenergic  state, but is occurring randomly. She is oblivious to the palpitations and therefore cannot give [place] a reliable reporter the frequency or burden of atrial fibrillation. Her current medications are doing a good job of rate control. I agree with the decision to place her on full anticoagulation therapy since her CHADSVasc score is elevated. We discussed the pros and cons of treatment with a short acting novel anticoagulant such as Eliquis. Therefore compliance is critical since the medication wears off quickly and she'll be quickly unprotected. On the other hand a short action is beneficial if she requires surgical procedures or has bleeding complications. It sounds like she really requires treatment with nonsteroidal anti-inflammatory drugs in order to maintain mobility. I did mention that Celebrex increases the risk of gastrointestinal bleeding (although it may be better than other NSAIDs). On the other hand I do not see a good indication for her to continue aspirin and I have recommended that she stop this to reduce risk of bleeding. It is not clear at this time whether she has paroxysmal atrial fibrillation, persistent atrial fibrillation or maybe has even settled into a pattern of permanent atrial fibrillation. She does appear to be fatigued and has some dyspnea compared with months past. She may benefit from cardioversion and returned to normal  rhythm. Have told him that if we decided to perform a cardioversion we will have to have uninterrupted anticoagulation for 3 weeks before that procedure in 4 weeks following. Have suggested that she have her anemia workup with Dr. Loreta Ave the first. Depending on the results of that study we will decide whether it is safe to proceed with a cardioversion. I would be particularly worried if we have to stop anticoagulation abruptly in the first month after return to normal rhythm. Also ordered an echocardiogram which will help guide [place] towards the likelihood of maintenance of normal rhythm and the need for antiarrhythmic therapy in the long term.  Orders Placed This Encounter  Procedures  . EKG 12-Lead  . 2D Echocardiogram with contrast   Meds ordered this encounter  Medications  . ferrous sulfate 325 (65 FE) MG EC tablet    Sig: Take 325 mg by mouth daily with breakfast.  . predniSONE (DELTASONE) 5 MG tablet    Sig: Take 10 mg by mouth daily.  Marland Kitchen ELIQUIS 5 MG TABS tablet    Sig: Take 5 mg by mouth 2 (two) times daily.    Junious Silk, MD, [facility] [phone] office [phone] pager

## 2012-11-06 ENCOUNTER — Ambulatory Visit (HOSPITAL_COMMUNITY): Payer: Medicare Other

## 2012-11-06 ENCOUNTER — Emergency Department (HOSPITAL_COMMUNITY): Payer: Medicare Other

## 2012-11-06 ENCOUNTER — Encounter (HOSPITAL_COMMUNITY): Payer: Self-pay | Admitting: Emergency Medicine

## 2012-11-06 ENCOUNTER — Other Ambulatory Visit: Payer: Self-pay | Admitting: Physician Assistant

## 2012-11-06 ENCOUNTER — Inpatient Hospital Stay (HOSPITAL_COMMUNITY)
Admission: EM | Admit: 2012-11-06 | Discharge: 2012-11-11 | DRG: 871 | Disposition: A | Discharge status: Home / Self Care | Payer: Medicare Other | Attending: Internal Medicine | Admitting: Internal Medicine

## 2012-11-06 DIAGNOSIS — G629 Polyneuropathy, unspecified: Secondary | ICD-10-CM | POA: Diagnosis present

## 2012-11-06 DIAGNOSIS — Z87891 Personal history of nicotine dependence: Secondary | ICD-10-CM

## 2012-11-06 DIAGNOSIS — G40901 Epilepsy, unspecified, not intractable, with status epilepticus: Secondary | ICD-10-CM | POA: Diagnosis present

## 2012-11-06 DIAGNOSIS — F3289 Other specified depressive episodes: Secondary | ICD-10-CM | POA: Diagnosis present

## 2012-11-06 DIAGNOSIS — D509 Iron deficiency anemia, unspecified: Secondary | ICD-10-CM | POA: Diagnosis present

## 2012-11-06 DIAGNOSIS — I10 Essential (primary) hypertension: Secondary | ICD-10-CM | POA: Diagnosis present

## 2012-11-06 DIAGNOSIS — F329 Major depressive disorder, single episode, unspecified: Secondary | ICD-10-CM

## 2012-11-06 DIAGNOSIS — L4050 Arthropathic psoriasis, unspecified: Secondary | ICD-10-CM | POA: Diagnosis present

## 2012-11-06 DIAGNOSIS — L02419 Cutaneous abscess of limb, unspecified: Secondary | ICD-10-CM | POA: Diagnosis present

## 2012-11-06 DIAGNOSIS — A419 Sepsis, unspecified organism: Principal | ICD-10-CM | POA: Diagnosis present

## 2012-11-06 DIAGNOSIS — L03115 Cellulitis of right lower limb: Secondary | ICD-10-CM | POA: Diagnosis present

## 2012-11-06 DIAGNOSIS — F32A Depression, unspecified: Secondary | ICD-10-CM | POA: Diagnosis present

## 2012-11-06 DIAGNOSIS — G609 Hereditary and idiopathic neuropathy, unspecified: Secondary | ICD-10-CM | POA: Diagnosis present

## 2012-11-06 DIAGNOSIS — Z7982 Long term (current) use of aspirin: Secondary | ICD-10-CM

## 2012-11-06 DIAGNOSIS — IMO0002 Reserved for concepts with insufficient information to code with codable children: Secondary | ICD-10-CM

## 2012-11-06 DIAGNOSIS — R6510 Systemic inflammatory response syndrome (SIRS) of non-infectious origin without acute organ dysfunction: Secondary | ICD-10-CM | POA: Diagnosis present

## 2012-11-06 DIAGNOSIS — E872 Acidosis, unspecified: Secondary | ICD-10-CM | POA: Diagnosis present

## 2012-11-06 DIAGNOSIS — Z6837 Body mass index (BMI) 37.0-37.9, adult: Secondary | ICD-10-CM

## 2012-11-06 DIAGNOSIS — Z79899 Other long term (current) drug therapy: Secondary | ICD-10-CM

## 2012-11-06 DIAGNOSIS — Z7901 Long term (current) use of anticoagulants: Secondary | ICD-10-CM

## 2012-11-06 DIAGNOSIS — E538 Deficiency of other specified B group vitamins: Secondary | ICD-10-CM | POA: Diagnosis present

## 2012-11-06 DIAGNOSIS — R579 Shock, unspecified: Secondary | ICD-10-CM | POA: Diagnosis present

## 2012-11-06 DIAGNOSIS — Z96659 Presence of unspecified artificial knee joint: Secondary | ICD-10-CM

## 2012-11-06 DIAGNOSIS — Z66 Do not resuscitate: Secondary | ICD-10-CM | POA: Diagnosis present

## 2012-11-06 DIAGNOSIS — E2740 Unspecified adrenocortical insufficiency: Secondary | ICD-10-CM

## 2012-11-06 DIAGNOSIS — I959 Hypotension, unspecified: Secondary | ICD-10-CM

## 2012-11-06 DIAGNOSIS — K2970 Gastritis, unspecified, without bleeding: Secondary | ICD-10-CM | POA: Diagnosis present

## 2012-11-06 DIAGNOSIS — K2900 Acute gastritis without bleeding: Secondary | ICD-10-CM | POA: Diagnosis present

## 2012-11-06 DIAGNOSIS — E669 Obesity, unspecified: Secondary | ICD-10-CM | POA: Diagnosis present

## 2012-11-06 DIAGNOSIS — E2749 Other adrenocortical insufficiency: Secondary | ICD-10-CM | POA: Diagnosis present

## 2012-11-06 DIAGNOSIS — K5730 Diverticulosis of large intestine without perforation or abscess without bleeding: Secondary | ICD-10-CM | POA: Diagnosis present

## 2012-11-06 DIAGNOSIS — I480 Paroxysmal atrial fibrillation: Secondary | ICD-10-CM | POA: Diagnosis present

## 2012-11-06 DIAGNOSIS — R0601 Orthopnea: Secondary | ICD-10-CM | POA: Diagnosis not present

## 2012-11-06 DIAGNOSIS — R112 Nausea with vomiting, unspecified: Secondary | ICD-10-CM

## 2012-11-06 DIAGNOSIS — E860 Dehydration: Secondary | ICD-10-CM | POA: Diagnosis present

## 2012-11-06 DIAGNOSIS — R578 Other shock: Secondary | ICD-10-CM | POA: Diagnosis present

## 2012-11-06 DIAGNOSIS — I4891 Unspecified atrial fibrillation: Secondary | ICD-10-CM | POA: Diagnosis present

## 2012-11-06 HISTORY — DX: Anemia, unspecified: D64.9

## 2012-11-06 HISTORY — DX: Personal history of other medical treatment: Z92.89

## 2012-11-06 LAB — PREPARE RBC (CROSSMATCH)

## 2012-11-06 LAB — CBC WITH DIFFERENTIAL/PLATELET
Eosinophils Relative: 1 % (ref 0–5)
Lymphocytes Relative: 5 % — ABNORMAL LOW (ref 12–46)
Monocytes Relative: 1 % — ABNORMAL LOW (ref 3–12)
Neutrophils Relative %: 93 % — ABNORMAL HIGH (ref 43–77)
Platelets: 400 10*3/uL (ref 150–400)
RBC: 2.96 MIL/uL — ABNORMAL LOW (ref 3.87–5.11)
WBC: 18.9 10*3/uL — ABNORMAL HIGH (ref 4.0–10.5)

## 2012-11-06 LAB — URINALYSIS, ROUTINE W REFLEX MICROSCOPIC
Bilirubin Urine: NEGATIVE
Glucose, UA: NEGATIVE mg/dL
Hgb urine dipstick: NEGATIVE
Protein, ur: NEGATIVE mg/dL
Specific Gravity, Urine: 1.013 (ref 1.005–1.030)
Urobilinogen, UA: 0.2 mg/dL (ref 0.0–1.0)

## 2012-11-06 LAB — RETICULOCYTES
Retic Count, Absolute: 86.8 10*3/uL (ref 19.0–186.0)
Retic Ct Pct: 3.5 % — ABNORMAL HIGH (ref 0.4–3.1)

## 2012-11-06 LAB — LACTIC ACID, PLASMA
Lactic Acid, Venous: 5.5 mmol/L — ABNORMAL HIGH (ref 0.5–2.2)
Lactic Acid, Venous: 2.2 mmol/L (ref 0.5–2.2)

## 2012-11-06 LAB — CBC
HCT: 24.2 % — ABNORMAL LOW (ref 36.0–46.0)
MCHC: 29.8 g/dL — ABNORMAL LOW (ref 30.0–36.0)
Platelets: 341 10*3/uL (ref 150–400)
RBC: 3.1 MIL/uL — ABNORMAL LOW (ref 3.87–5.11)
RDW: 20.1 % — ABNORMAL HIGH (ref 11.5–15.5)
WBC: 42.3 10*3/uL — ABNORMAL HIGH (ref 4.0–10.5)

## 2012-11-06 LAB — COMPREHENSIVE METABOLIC PANEL
ALT: 11 U/L (ref 0–35)
AST: 14 U/L (ref 0–37)
Albumin: 2.5 g/dL — ABNORMAL LOW (ref 3.5–5.2)
CO2: 26 mEq/L (ref 19–32)
Chloride: 101 mEq/L (ref 96–112)
GFR calc non Af Amer: 70 mL/min — ABNORMAL LOW (ref >90)
Sodium: 138 mEq/L (ref 135–145)
Total Bilirubin: 0.2 mg/dL — ABNORMAL LOW (ref 0.3–1.2)

## 2012-11-06 LAB — MRSA PCR SCREENING: MRSA by PCR: NEGATIVE

## 2012-11-06 LAB — IRON AND TIBC
Iron: <10 ug/dL — ABNORMAL LOW (ref 42–135)
UIBC: 330 ug/dL (ref 125–400)

## 2012-11-06 LAB — FERRITIN: Ferritin: 11 ng/mL (ref 10–291)

## 2012-11-06 LAB — GLUCOSE, CAPILLARY
Glucose-Capillary: 102 mg/dL — ABNORMAL HIGH (ref 70–99)
Glucose-Capillary: 99 mg/dL (ref 70–99)

## 2012-11-06 LAB — FOLATE: Folate: >20 ng/mL

## 2012-11-06 MED ORDER — LEVOFLOXACIN IN D5W 750 MG/150ML IV SOLN
750.0000 mg | Freq: Once | INTRAVENOUS | Status: AC
  Administered 2012-11-06: 750 mg via INTRAVENOUS
  Filled 2012-11-06: qty 150

## 2012-11-06 MED ORDER — SODIUM CHLORIDE 0.9 % IV SOLN
INTRAVENOUS | Status: DC
  Administered 2012-11-06 – 2012-11-08 (×5): via INTRAVENOUS

## 2012-11-06 MED ORDER — SODIUM CHLORIDE 0.9 % IV BOLUS (SEPSIS)
1000.0000 mL | Freq: Once | INTRAVENOUS | Status: AC
  Administered 2012-11-06: 1000 mL via INTRAVENOUS

## 2012-11-06 MED ORDER — VITAMIN D (ERGOCALCIFEROL) 1.25 MG (50000 UNIT) PO CAPS
50000.0000 [IU] | ORAL_CAPSULE | ORAL | Status: DC
  Administered 2012-11-07: 50000 [IU] via ORAL
  Filled 2012-11-06: qty 1

## 2012-11-06 MED ORDER — SODIUM CHLORIDE 0.9 % IV BOLUS (SEPSIS)
500.0000 mL | Freq: Once | INTRAVENOUS | Status: AC
  Administered 2012-11-06: 500 mL via INTRAVENOUS

## 2012-11-06 MED ORDER — SODIUM CHLORIDE 0.9 % IJ SOLN
3.0000 mL | Freq: Two times a day (BID) | INTRAMUSCULAR | Status: DC
  Administered 2012-11-07 – 2012-11-10 (×7): 3 mL via INTRAVENOUS

## 2012-11-06 MED ORDER — DEXTROSE 5 % IV SOLN
2.0000 g | Freq: Once | INTRAVENOUS | Status: AC
  Administered 2012-11-06: 2 g via INTRAVENOUS
  Filled 2012-11-06: qty 2

## 2012-11-06 MED ORDER — VITAMIN C 100 MG PO TABS: 100.0000 mg | ORAL_TABLET | Freq: Every day | ORAL | Status: DC

## 2012-11-06 MED ORDER — LEVOFLOXACIN IN D5W 500 MG/100ML IV SOLN
500.0000 mg | INTRAVENOUS | Status: DC
  Filled 2012-11-06: qty 100

## 2012-11-06 MED ORDER — PANTOPRAZOLE SODIUM 40 MG IV SOLR: 40.0000 mg | INTRAVENOUS | Status: DC

## 2012-11-06 MED ORDER — HYDROCORTISONE SOD SUCCINATE 100 MG PF FOR IT USE: 50.0000 mg | Freq: Four times a day (QID) | INTRAMUSCULAR | Status: DC

## 2012-11-06 MED ORDER — VANCOMYCIN HCL IN DEXTROSE 750-5 MG/150ML-% IV SOLN
750.0000 mg | Freq: Two times a day (BID) | INTRAVENOUS | Status: DC
  Administered 2012-11-07 (×2): 750 mg via INTRAVENOUS
  Filled 2012-11-06 (×2): qty 150

## 2012-11-06 MED ORDER — HYDROCORTISONE SOD SUCCINATE 100 MG IJ SOLR
50.0000 mg | Freq: Four times a day (QID) | INTRAMUSCULAR | Status: DC
  Administered 2012-11-06 – 2012-11-07 (×4): 50 mg via INTRAVENOUS
  Filled 2012-11-06 (×7): qty 1

## 2012-11-06 MED ORDER — VANCOMYCIN HCL IN DEXTROSE 1-5 GM/200ML-% IV SOLN
1000.0000 mg | Freq: Once | INTRAVENOUS | Status: AC
  Administered 2012-11-06: 1000 mg via INTRAVENOUS
  Filled 2012-11-06: qty 200

## 2012-11-06 MED ORDER — PANTOPRAZOLE SODIUM 40 MG IV SOLR
40.0000 mg | Freq: Every day | INTRAVENOUS | Status: DC
  Administered 2012-11-06 – 2012-11-07 (×2): 40 mg via INTRAVENOUS
  Filled 2012-11-06 (×3): qty 40

## 2012-11-06 MED ORDER — DILTIAZEM HCL ER COATED BEADS 180 MG PO CP24
180.0000 mg | ORAL_CAPSULE | Freq: Every day | ORAL | Status: DC
  Administered 2012-11-06 – 2012-11-08 (×3): 180 mg via ORAL
  Filled 2012-11-06 (×4): qty 1

## 2012-11-06 MED ORDER — DULOXETINE HCL 60 MG PO CPEP
60.0000 mg | ORAL_CAPSULE | Freq: Every day | ORAL | Status: DC
  Administered 2012-11-06: 60 mg via ORAL
  Filled 2012-11-06 (×2): qty 1

## 2012-11-06 MED ORDER — METHYLPREDNISOLONE SODIUM SUCC 125 MG IJ SOLR
100.0000 mg | Freq: Once | INTRAMUSCULAR | Status: AC
  Administered 2012-11-06: 100 mg via INTRAVENOUS
  Filled 2012-11-06: qty 2

## 2012-11-06 MED ORDER — SODIUM CHLORIDE 0.9 % IV SOLN: 250.0000 mL | INTRAVENOUS | Status: DC | PRN

## 2012-11-06 MED ORDER — ADULT MULTIVITAMIN W/MINERALS CH
1.0000 | ORAL_TABLET | Freq: Every day | ORAL | Status: DC
  Administered 2012-11-06 – 2012-11-10 (×5): 1 via ORAL
  Filled 2012-11-06 (×5): qty 1

## 2012-11-06 MED ORDER — ASPIRIN EC 81 MG PO TBEC
81.0000 mg | DELAYED_RELEASE_TABLET | Freq: Every day | ORAL | Status: DC
  Administered 2012-11-06 – 2012-11-09 (×4): 81 mg via ORAL
  Filled 2012-11-06 (×5): qty 1

## 2012-11-06 MED ORDER — GABAPENTIN 600 MG PO TABS
600.0000 mg | ORAL_TABLET | Freq: Two times a day (BID) | ORAL | Status: DC
  Administered 2012-11-06 – 2012-11-11 (×10): 600 mg via ORAL
  Filled 2012-11-06 (×11): qty 1

## 2012-11-06 MED ORDER — VANCOMYCIN HCL 10 G IV SOLR
1750.0000 mg | INTRAVENOUS | Status: DC
  Filled 2012-11-06: qty 1750

## 2012-11-06 MED ORDER — DULOXETINE HCL 30 MG PO CPEP
30.0000 mg | ORAL_CAPSULE | Freq: Every day | ORAL | Status: DC
  Administered 2012-11-06: 30 mg via ORAL
  Filled 2012-11-06 (×2): qty 1

## 2012-11-06 MED ORDER — DEXTROSE 5 % IV SOLN
1.0000 g | Freq: Three times a day (TID) | INTRAVENOUS | Status: DC
  Filled 2012-11-06 (×2): qty 1

## 2012-11-06 MED ORDER — SODIUM CHLORIDE 0.9 % IV SOLN: INTRAVENOUS | Status: DC

## 2012-11-06 MED ORDER — VANCOMYCIN HCL IN DEXTROSE 750-5 MG/150ML-% IV SOLN
750.0000 mg | INTRAVENOUS | Status: AC
  Administered 2012-11-06: 750 mg via INTRAVENOUS
  Filled 2012-11-06: qty 150

## 2012-11-06 MED ORDER — FERROUS SULFATE 325 (65 FE) MG PO TABS
325.0000 mg | ORAL_TABLET | Freq: Two times a day (BID) | ORAL | Status: DC
  Administered 2012-11-06 – 2012-11-07 (×2): 325 mg via ORAL
  Filled 2012-11-06 (×4): qty 1

## 2012-11-06 MED ORDER — MAGNESIUM 250 MG PO TABS: 1.0000 | ORAL_TABLET | Freq: Every day | ORAL | Status: DC

## 2012-11-06 MED ORDER — MAGNESIUM OXIDE 400 (241.3 MG) MG PO TABS
200.0000 mg | ORAL_TABLET | Freq: Every day | ORAL | Status: DC
  Administered 2012-11-06 – 2012-11-09 (×4): 200 mg via ORAL
  Administered 2012-11-10: 11:00:00 via ORAL
  Filled 2012-11-06 (×5): qty 0.5

## 2012-11-06 MED ORDER — PREDNISONE 10 MG PO TABS: 10.0000 mg | ORAL_TABLET | Freq: Every day | ORAL | Status: DC

## 2012-11-06 NOTE — Progress Notes (Signed)
ANTIBIOTIC CONSULT NOTE - INITIAL  Pharmacy Consult for Vanco/Aztreonam/Levaquin Indication: Sepsis  Allergies  Allergen Reactions  . Penicillins Other (See Comments)    Throat swelling     Patient Measurements: Height: 4\' 11"  (149.9 cm) Weight: 184 lb (83.462 kg) IBW/kg (Calculated) : 43.2 Adjusted Body Weight:   Vital Signs: Temp: 103.2 F (39.6 C) (10/13 0942) Temp src: Rectal (10/13 0942) BP: 88/47 mmHg (10/13 1048) Pulse Rate: 129 (10/13 1048) Intake/Output from previous day:   Intake/Output from this shift:    Labs:  Recent Labs  11/06/12 1015  WBC 18.9*  HGB 6.3*  PLT 400   Estimated Creatinine Clearance: 58.6 ml/min (by C-G formula based on Cr of 0.69). No results found for this basename: VANCOTROUGH, VANCOPEAK, VANCORANDOM, GENTTROUGH, GENTPEAK, GENTRANDOM, TOBRATROUGH, TOBRAPEAK, TOBRARND, AMIKACINPEAK, AMIKACINTROU, AMIKACIN,  in the last 72 hours   Microbiology: No results found for this or any previous visit (from the past 720 hour(s)).  Medical History: Past Medical History  Diagnosis Date  . A-fib   . Hypertension   . Arthritis   . Peripheral neuropathy     Medications: med rec pending  Assessment: Fever, chills, CBG 60, rigors, Hgb 6.3  PMH: steroid dependent psoriatic arthritis, atrial fibrillation, HTN, peripheral neuropathy  72 y/o female presents via EMS with fever, chills, CBG 60, rigors, found to have critical Hgb 6.3. Code sepsis called. Temp 103.2. WBC 18.9. Estimated CrCl 58. LA 4.29.   Goal of Therapy:  Vancomycin trough level 15-20 mcg/ml  Plan:  Aztreonam 2g IV stat per ED orders, then 1g IV q8hr Levaquin 750mg  IV stat per ED orders, 500mg  IV q24h Vanco 1750mg  total to make 20mg /kg, then 750mg  IV q12 hrs. Trough after 3-5 doses at steady state.   Pastor Sgro S. Merilynn Finland, PharmD, BCPS Clinical Staff Pharmacist Pager [phone]  Misty Stanley Stillinger 11/06/2012,11:04 AM

## 2012-11-06 NOTE — ED Notes (Signed)
MD at bedside. 

## 2012-11-06 NOTE — ED Notes (Signed)
Pt here by ems for fever and chills, initial cbg was 60 and after glucose was 76, initially had tremor and partially subsided after glucose admin. Pt alert here and per ems altered. She "feels off"

## 2012-11-06 NOTE — ED Notes (Signed)
Several attempts made to obtain second IV site on patient.

## 2012-11-06 NOTE — Consult Note (Addendum)
PULMONARY  / CRITICAL CARE MEDICINE  Name: Andrea Yu MRN: [id number] DOB: 05/03/1940    ADMISSION DATE:  11/06/2012 CONSULTATION DATE:  11/06/2012  REFERRING MD :  EDP/TRH PRIMARY SERVICE: PCCM  CHIEF COMPLAINT:  Fever, Weakness  BRIEF PATIENT DESCRIPTION: 72 F with hx of AF, HTN, psoriatic arthritis presented to [facility] ED 10/13 with fever and weakness x 7 - 10 days.Febrile, hypotensive in ED despite NS boluses.  TRH initially evaluated but hypotension persisted so admitted to ICU and PCCm consult requested  SIGNIFICANT EVENTS / STUDIES:  10/13 - Admit with hypotension, anemia, Hgb 6.3, elevated lactate (4.2) 10/13 Echo:     LINES / TUBES:   CULTURES: Blood 10/13 >>  Urine 10/13 >> UA negative   ANTIBIOTICS: Aztreonam 10/13 >>>10/13 Levofloxacin 10/13 >>  Vanc 10/13 >>   HISTORY OF PRESENT ILLNESS:  Andrea Yu is a 72 y.o. Caucasian female with PMH of Afib, HTN, and Psoriatic Arthritis on Enbrel & Prednisone who presented to [facility] ED today (10/13) for fever and rigors. Pt states she woke up this morning and was feeling weak; however, she states that she has been weak for the past 7 - 10 days.  Prior to this, she was in her normal state of health.  At baseline, her husband reports she is walker dependent.  Has difficulties getting in / out of bathtub and has decreased activity tolerance for past two years.    She reports associated dizziness over the past "few days".  She denies any recent travel any sick contacts. She did receive her influenza vaccination in September of 2014. She was recently started on Eliquis 2 weeks prior to admit for atrial fibrillation.  Pt denies any similar symptoms in the past.  She denies any symptoms of bleeding; however, states that her stools are always black since she takes an iron supplement daily.  She denies any hematemesis or hematochezia.  She also denies any coughing, headache, wheezing, chest pain, SOB, abdominal pain, N/V/D, dysuria,  rash.  Previously has been evaluated for anemia by Dr. Jarold Motto which was consistent with IDA and was placed on iron.  She was pending evaluation per GI but has not made it to the appointment.  Was scheduled for ECHO on 10/13 with Dr. Royann Shivers but came to ER instead with above complaints.   PAST MEDICAL HISTORY :  Past Medical History  Diagnosis Date  . A-fib   . Hypertension   . Arthritis   . Peripheral neuropathy    Past Surgical History  Procedure Laterality Date  . Back surgery    . Replacement total knee bilateral    . Cholecystectomy    . Abdominal hysterectomy     Prior to Admission medications   Medication Sig Start Date End Date Taking? Authorizing Provider  apixaban (ELIQUIS) 5 MG TABS tablet Take 5 mg by mouth 2 (two) times daily.   Yes Historical Provider, MD  Ascorbic Acid (VITAMIN C) 100 MG tablet Take 100 mg by mouth at bedtime.   Yes Historical Provider, MD  aspirin EC 81 MG tablet Take 81 mg by mouth at bedtime.   Yes Historical Provider, MD  BIOTIN 5000 PO Take 5,000 mg by mouth at bedtime.    Yes Historical Provider, MD  celecoxib (CELEBREX) 200 MG capsule Take 200 mg by mouth at bedtime.   Yes Historical Provider, MD  Coenzyme Q10 (COQ-10) 100 MG CAPS Take 100 mg by mouth daily.    Yes Historical Provider, MD  diltiazem (CARDIZEM  CD) 180 MG 24 hr capsule Take 180 mg by mouth at bedtime.   Yes Historical Provider, MD  DULoxetine (CYMBALTA) 30 MG capsule Take 30 mg by mouth daily. Take along with 60mg  to make a total of 90mg    Yes Historical Provider, MD  DULoxetine (CYMBALTA) 60 MG capsule Take 60 mg by mouth daily. Take along with 30mg  capsule to make a total of 90mg    Yes Historical Provider, MD  estradiol (ESTRACE) 2 MG tablet Take 2 mg by mouth daily. 08/26/12  Yes Historical Provider, MD  etanercept (ENBREL) 50 MG/ML injection Inject 50 mg into the skin once a week. Tuesday   Yes Historical Provider, MD  ferrous sulfate 325 (65 FE) MG EC tablet Take 325 mg by  mouth daily with breakfast.   Yes Historical Provider, MD  FLAXSEED, LINSEED, PO Take 1,200 mg by mouth daily.   Yes Historical Provider, MD  furosemide (LASIX) 40 MG tablet Take 40 mg by mouth daily.   Yes Historical Provider, MD  gabapentin (NEURONTIN) 600 MG tablet Take 600 mg by mouth 2 (two) times daily.   Yes Historical Provider, MD  HYDROcodone-acetaminophen (NORCO/VICODIN) 5-325 MG per tablet Take 1 tablet by mouth every 6 (six) hours as needed for pain.   Yes Historical Provider, MD  Magnesium 250 MG TABS Take 1 tablet by mouth daily.   Yes Historical Provider, MD  metoprolol succinate (TOPROL-XL) 25 MG 24 hr tablet Take 25 mg by mouth daily. Take with Toprol XL 50mg  tablet   Yes Historical Provider, MD  metoprolol succinate (TOPROL-XL) 50 MG 24 hr tablet Take 100 mg by mouth daily. Take along with 25mg  toprol xl   Yes Historical Provider, MD  Multiple Vitamin (MULTIVITAMIN WITH MINERALS) TABS Take 1 tablet by mouth daily.   Yes Historical Provider, MD  omeprazole (PRILOSEC) 20 MG capsule Take 20 mg by mouth daily.   Yes Historical Provider, MD  potassium chloride SA (K-DUR,KLOR-CON) 20 MEQ tablet Take 40 mEq by mouth at bedtime.    Yes Historical Provider, MD  predniSONE (DELTASONE) 5 MG tablet Take 10 mg by mouth daily. 08/12/12  Yes Gaspar Garbe, MD  Vitamin D, Ergocalciferol, (DRISDOL) 50000 UNITS CAPS Take 50,000 Units by mouth every 7 (seven) days. Tuesday   Yes Historical Provider, MD   Allergies  Allergen Reactions  . Penicillins Other (See Comments)    Throat swelling     FAMILY HISTORY:  History reviewed. No pertinent family history. SOCIAL HISTORY:  reports that she quit smoking about 31 years ago. She has never used smokeless tobacco. She reports that she does not drink alcohol or use illicit drugs.  REVIEW OF SYSTEMS:  Completed in it's entirety.  See HPI for details.  SUBJECTIVE:     VITAL SIGNS: Temp:  [100.3 F (37.9 C)-103.2 F (39.6 C)] 103.2 F (39.6  C) (10/13 0942) Pulse Rate:  [107-138] 113 (10/13 1315) Resp:  [13-24] 20 (10/13 1430) BP: (67-128)/(35-58) 82/58 mmHg (10/13 1430) SpO2:  [98 %-100 %] 100 % (10/13 1315) Weight:  [184 lb (83.462 kg)] 184 lb (83.462 kg) (10/13 0930)  HEMODYNAMICS:    VENTILATOR SETTINGS:    INTAKE / OUTPUT: Intake/Output     10/12 0701 - 10/13 0700 10/13 0701 - 10/14 0700   I.V. (mL/kg)  2000 (24)   Total Intake(mL/kg)  2000 (24)   Net   +2000          PHYSICAL EXAMINATION: General:  Elderly.  Appears mildly uncomfortable with chills.  Neuro:  Cognition intact, no focal deficits HEENT:  [place]/AT.  PERRL.  Conjunctivae pale.  MMM. Cardiovascular: irregularly irregular, no M noted Lungs:  Resp's even and unlabored.  CTA bilaterally, no W/R/R. Abdomen:  BS x 4.  Soft, NT/ND. Ext:  No edema, cool   LABS:  CBC Recent Labs     11/06/12  1015  WBC  18.9*  HGB  6.3*  HCT  22.7*  PLT  400   BMET Recent Labs     11/06/12  1015  NA  138  K  3.7  CL  101  CO2  26  BUN  15  CREATININE  0.81  GLUCOSE  87   Electrolytes Recent Labs     11/06/12  1015  CALCIUM  8.2*   Liver Enzymes Recent Labs     11/06/12  1015  AST  14  ALT  11  ALKPHOS  66  BILITOT  0.2*  ALBUMIN  2.5*   Cardiac Enzymes No results found for this basename: TROPONINI, PROBNP,  in the last 72 hours Glucose Recent Labs     11/06/12  0939  GLUCAP  76    CXR: NACPD    ASSESSMENT / PLAN:  CARDIOVASCULAR A: Shock - etiology unclear. Multifactorial suspected Atrial Fibrillation with RVR P:  Volume resuscitate Empiric hydrocortisone Echocardiogram Cards consult  HEMATOLOGIC A:  Microcytic anemia - acute on chronic P:  DVT px: SCDs Transfuse 2 units PRBCs Check anemia panel Continue iron supplementation Monitor CBC   GASTROINTESTINAL A: Possible chronic GI Bleed  P:   SUP: IV pantoprazole Check FOBT now, if positive then consult GI for further evaluation   INFECTIOUS A:  SIRS  without clear infectious source Immunosuppressed - steroids, etanercept P:   Micro and abx as above  RHEUMATOLOGIC A: Psoriatic Arthritis  P: Steroids ordered Hold Enbrel and Celebrex.  ENDOCRINE A:   Adrenal insufficiency At risk for steroid induced hyperglycemia P:   Monitor CBGs SSI if glu > 180  PULMONARY A: No acute issues. P:   - Monitor. - F/u AM CXR.  RENAL A:   No acute issues. P:   Monitor BMET intermittently Correct electrolytes as indicated   NEUROLOGIC A:  Depression - Chronic. P:   Holding duloxetine    Rutherford Guys, PA - S  Canary Brim, NP-C [facility] Pgr: [phone] or [phone]    I have personally obtained a history, examined the patient, evaluated laboratory and imaging results, formulated the assessment and plan and placed orders.  CRITICAL CARE: The patient is critically ill with multiple organ systems failure and requires high complexity decision making for assessment and support, frequent evaluation and titration of therapies, application of advanced monitoring technologies and extensive interpretation of multiple databases. Critical Care Time devoted to patient care services described in this note is 35 minutes.    Billy Fischer, MD ; [facility] [phone].  After 5:30 PM or weekends, call [phone]   11/06/2012, 2:44 PM

## 2012-11-06 NOTE — ED Provider Notes (Signed)
CSN: [id number]     Arrival date & time 11/06/12  5621 History   First MD Initiated Contact with Patient 11/06/12 [phone]     Chief Complaint  Patient presents with  . Fever   (Consider location/radiation/quality/duration/timing/severity/associated sxs/prior Treatment) HPI Comments: 72 y/o female with history of steroid dependent psoriatic arthritis, atrial fibrillation presenting with fever and chills. Reports symptoms began with an episode of rigors this am. Denies infectious symptoms. No cough, SOB, chest pain, abdominal pain, n/v/d, dysuria, rash. Hypoglycemic with EMS and reported AMS. Given glucose and now alert and oriented.   The history is provided by the patient. No language interpreter was used.    Past Medical History  Diagnosis Date  . A-fib   . Hypertension   . Arthritis   . Peripheral neuropathy    Past Surgical History  Procedure Laterality Date  . Back surgery    . Replacement total knee bilateral    . Cholecystectomy    . Abdominal hysterectomy     History reviewed. No pertinent family history. History  Substance Use Topics  . Smoking status: Former Smoker -- 30 years    Quit date: 08/09/1981  . Smokeless tobacco: Never Used  . Alcohol Use: No   OB History   Grav Para Term Preterm Abortions TAB SAB Ect Mult Living                 Review of Systems  Constitutional: Positive for fever and chills. Negative for activity change and appetite change.  HENT: Negative for ear pain and sore throat.   Respiratory: Negative for cough, chest tightness and shortness of breath.   Cardiovascular: Negative for chest pain and palpitations.  Gastrointestinal: Negative for nausea, vomiting, abdominal pain, diarrhea and abdominal distention.  Genitourinary: Negative for dysuria.  Musculoskeletal: Negative for arthralgias.  Skin: Negative for color change and rash.  Neurological: Negative for seizures, facial asymmetry, speech difficulty, weakness, numbness and headaches.   All other systems reviewed and are negative.    Allergies  Penicillins  Home Medications   Current Outpatient Rx  Name  Route  Sig  Dispense  Refill  . Ascorbic Acid (VITAMIN C) 100 MG tablet   Oral   Take 100 mg by mouth at bedtime.         Marland Kitchen aspirin EC 81 MG tablet   Oral   Take 81 mg by mouth at bedtime.         Marland Kitchen BIOTIN 5000 PO   Oral   Take 1 tablet by mouth at bedtime.         . celecoxib (CELEBREX) 200 MG capsule   Oral   Take 200 mg by mouth at bedtime.         . Coenzyme Q10 (COQ-10) 100 MG CAPS   Oral   Take 1 capsule by mouth daily.         Marland Kitchen diltiazem (CARDIZEM CD) 180 MG 24 hr capsule   Oral   Take 180 mg by mouth at bedtime.         . DULoxetine (CYMBALTA) 30 MG capsule   Oral   Take 30 mg by mouth daily. Take along with 60mg  to make a total of 90mg          . DULoxetine (CYMBALTA) 60 MG capsule   Oral   Take 60 mg by mouth daily. Take along with 30mg  capsule to make a total of 90mg          . ELIQUIS 5  MG TABS tablet   Oral   Take 5 mg by mouth 2 (two) times daily.         Marland Kitchen etanercept (ENBREL) 50 MG/ML injection   Subcutaneous   Inject 50 mg into the skin once a week. Tuesday         . ferrous sulfate 325 (65 FE) MG EC tablet   Oral   Take 325 mg by mouth daily with breakfast.         . FLAXSEED, LINSEED, PO   Oral   Take 1,200 mg by mouth daily.         . furosemide (LASIX) 40 MG tablet   Oral   Take 40 mg by mouth daily.         Marland Kitchen gabapentin (NEURONTIN) 600 MG tablet   Oral   Take 600 mg by mouth 2 (two) times daily.         Marland Kitchen HYDROcodone-acetaminophen (NORCO/VICODIN) 5-325 MG per tablet   Oral   Take 1 tablet by mouth every 6 (six) hours as needed for pain.         . Magnesium 250 MG TABS   Oral   Take 1 tablet by mouth daily.         . metoprolol succinate (TOPROL-XL) 50 MG 24 hr tablet   Oral   Take 100 mg by mouth daily. Take along with 25mg  to make a total of 75mg . Take with or  immediately following a meal.         . Multiple Vitamin (MULTIVITAMIN WITH MINERALS) TABS   Oral   Take 1 tablet by mouth daily.         Marland Kitchen omeprazole (PRILOSEC) 20 MG capsule   Oral   Take 20 mg by mouth daily.         . potassium chloride SA (K-DUR,KLOR-CON) 20 MEQ tablet   Oral   Take 40 mEq by mouth at bedtime.          . predniSONE (DELTASONE) 5 MG tablet   Oral   Take 10 mg by mouth daily.         . Vitamin D, Ergocalciferol, (DRISDOL) 50000 UNITS CAPS   Oral   Take 50,000 Units by mouth every 7 (seven) days. Tuesday          BP 128/35  Pulse 107  Temp(Src) 100.3 F (37.9 C) (Oral)  Resp 13  Ht 4\' 11"  (1.499 m)  Wt 184 lb (83.462 kg)  BMI 37.14 kg/m2  SpO2 98% Physical Exam  Vitals reviewed. Constitutional: She is oriented to person, place, and time. She appears well-developed and well-nourished. No distress.  HENT:  Head: Normocephalic.  Mouth/Throat: Oropharynx is clear and moist.  Eyes: EOM are normal. Pupils are equal, round, and reactive to light.  Neck: Normal range of motion.  Cardiovascular: Normal heart sounds and intact distal pulses.  An irregularly irregular rhythm present. Tachycardia present.   Pulmonary/Chest: Effort normal and breath sounds normal. She has no wheezes. She has no rales.  Abdominal: Soft. Bowel sounds are normal. She exhibits no distension. There is no tenderness.  Musculoskeletal: Normal range of motion.  Neurological: She is alert and oriented to person, place, and time.  Skin: Skin is warm and dry. No rash noted.  Laceration to right LE without erythema or purulent discharge    ED Course  Procedures (including critical care time) Labs Review Labs Reviewed  CBC WITH DIFFERENTIAL - Abnormal; Notable for the following:  WBC 18.9 (*)    RBC 2.96 (*)    Hemoglobin 6.3 (*)    HCT 22.7 (*)    MCV 76.7 (*)    MCH 21.3 (*)    MCHC 27.8 (*)    RDW 20.1 (*)    Neutrophils Relative % 93 (*)    Lymphocytes  Relative 5 (*)    Monocytes Relative 1 (*)    Neutro Abs 17.6 (*)    All other components within normal limits  COMPREHENSIVE METABOLIC PANEL - Abnormal; Notable for the following:    Calcium 8.2 (*)    Total Protein 5.1 (*)    Albumin 2.5 (*)    Total Bilirubin 0.2 (*)    GFR calc non Af Amer 70 (*)    GFR calc Af Amer 82 (*)    All other components within normal limits  LACTIC ACID, PLASMA - Abnormal; Notable for the following:    Lactic Acid, Venous 5.5 (*)    All other components within normal limits  CG4 I-STAT (LACTIC ACID) - Abnormal; Notable for the following:    Lactic Acid, Venous 4.29 (*)    All other components within normal limits  CULTURE, BLOOD (ROUTINE X 2)  CULTURE, BLOOD (ROUTINE X 2)  URINE CULTURE  URINALYSIS, ROUTINE W REFLEX MICROSCOPIC  GLUCOSE, CAPILLARY  LACTIC ACID, PLASMA  VITAMIN B12  FOLATE  IRON AND TIBC  FERRITIN  RETICULOCYTES  TROPONIN I  OCCULT BLOOD, POC DEVICE  TYPE AND SCREEN  ABO/RH  PREPARE RBC (CROSSMATCH)  PREPARE RBC (CROSSMATCH)   Imaging Review Dg Chest 2 View  11/06/2012   CLINICAL DATA:  Fever, chills.  EXAM: CHEST  2 VIEW  COMPARISON:  08/09/2012  FINDINGS: The heart size and mediastinal contours are within normal limits. Both lungs are clear. Degenerative changes throughout the lower thoracic and visualized upper lumbar spine.  IMPRESSION: No active cardiopulmonary disease.   Electronically Signed   By: Charlett Nose M.D.   On: 11/06/2012 10:09    EKG Interpretation   None       Date: 11/06/2012  Rate: 101  Rhythm: atrial fibrillation  QRS Axis: normal  Intervals: normal  ST/T Wave abnormalities: normal  Conduction Disutrbances:none  Narrative Interpretation: atrial fibrillation, no ischemic changes  Old EKG Reviewed: unchanged   MDM   1. Psoriatic arthritis   2. Adrenal insufficiency   3. Atrial fibrillation   4. Dehydration   5. Depression   6. Iron deficiency anemia   7. Peripheral neuropathy   8.  Severe sepsis     72 y/o female with history of steroid dependent psoriatic arthritis, atrial fibrillation (rate controlled) presenting with generalized malaise and rigors. Denies specific infectious symptoms. Febrile 103 rectal, tachycardic 110, normotensive and not hypoxic on arrival. Physical exam benign: lungs clear, no skin infections, abdomen soft and nontender. Normal mental status. Initially normotensive, but blood pressures became soft while in ED. 2 L IVF given with good response. Septic workup initiated. CXR without consolidation or effusion. Labs remarkable for lactic acidosis, WBC 18.9, Hgb 6.3. Stool heme negative. No clear source of infection. Given broad spectrum antibiotics, stress dose steroids, and 1 unit pRBCs. Discussed patient with Critical Care who believe patient appropriate for Medicine admission to step down unit.   Labs and imaging reviewed in my medical decision making if ordered. Patient discussed with my attending, Dr. Terrence Dupont, MD 11/06/12 1539

## 2012-11-06 NOTE — ED Notes (Addendum)
Spoke with Dr. Catha Gosselin to inform her of patient's decreasing BP. Advised to administer NS Bolus and call her back in .

## 2012-11-06 NOTE — ED Provider Notes (Addendum)
I saw and evaluated the patient, reviewed the resident's note and I agree with the findings and plan.  I have reviewed EKG and agree with resident's interpretation.    CRITICAL CARE Performed by: Redgie Grayer, Levonne Carreras   Total critical care time: 45  Critical care time was exclusive of separately billable procedures and treating other patients.  Critical care was necessary to treat or prevent imminent or life-threatening deterioration.  Critical care was time spent personally by me on the following activities: development of treatment plan with patient and/or surrogate as well as nursing, discussions with consultants, evaluation of patient's response to treatment, examination of patient, obtaining history from patient or surrogate, ordering and performing treatments and interventions, ordering and review of laboratory studies, ordering and review of radiographic studies, pulse oximetry and re-evaluation of patient's condition.   Severe sepsis, adrenal insufficiency, h/o Afib, Anemia.    ED Care: IFV, antibiotics (aztreonam, FQ, Vanc) Stress dose steroids.  VSS in ER.  Consulted Pulm Crit care for admit.  Crit care ok with medicine admit to SDU.  Pt and husband are aware of the plan.    Darlys Gales, MD 11/06/12 4782  Darlys Gales, MD 11/14/12 [phone]

## 2012-11-06 NOTE — Consult Note (Signed)
Reason for Consult: Afib RVR Referring Physician: PCCM Cardiologist:  Croitoru  Andrea Yu is an 72 y.o. female.  HPI: \  The patient is a 72 yo obese caucasian female with a history of PAF, HTN, psoriatic arthritis on chronic treatment with Enbrel peripheral neuropathy without diabetes, iron deficiency anemia,  hypoalbuminemia.  She was incidentally noted to have atrial fibrillation during an office evaluation with Dr. Eloise Harman. She has had 2 previous episodes of atrial fibrillation, both of them associated with intercurrent illnesses (once with pneumonia, the second time with an episode of severe dehydration).  She is on Eliquis for stroke prevention.   There is a strong family history of breast cancer lung cancer and brain cancer.   She sees Dr. Loreta Ave.  The patient presents with chills and fever.  Her Hgb in the ER is 6.3 which is down from previous of 8.7 on August 12, 2012.  She currently denies nausea, vomiting, chest pain, shortness of breath, orthopnea, dizziness, PND, cough, congestion, abdominal pain, hematochezia, melena, lower extremity edema.  She is currently in NSR/sinus tachycardia with frequent PACs.     Past Medical History  Diagnosis Date  . A-fib   . Hypertension   . Arthritis   . Peripheral neuropathy     Past Surgical History  Procedure Laterality Date  . Back surgery    . Replacement total knee bilateral    . Cholecystectomy    . Abdominal hysterectomy      Family History  Problem Relation Age of Onset  . Heart attack Father     1951-deceased   . Aneurysm Son 67    Deceased     Social History:  reports that she quit smoking about 31 years ago. She has never used smokeless tobacco. She reports that she does not drink alcohol or use illicit drugs.  Allergies:  Allergies  Allergen Reactions  . Penicillins Other (See Comments)    Throat swelling     Medications: Prior to Admission medications   Medication Sig Start Date End Date Taking? Authorizing  Provider  apixaban (ELIQUIS) 5 MG TABS tablet Take 5 mg by mouth 2 (two) times daily.   Yes Historical Provider, MD  Ascorbic Acid (VITAMIN C) 100 MG tablet Take 100 mg by mouth at bedtime.   Yes Historical Provider, MD  aspirin EC 81 MG tablet Take 81 mg by mouth at bedtime.   Yes Historical Provider, MD  BIOTIN 5000 PO Take 5,000 mg by mouth at bedtime.    Yes Historical Provider, MD  celecoxib (CELEBREX) 200 MG capsule Take 200 mg by mouth at bedtime.   Yes Historical Provider, MD  Coenzyme Q10 (COQ-10) 100 MG CAPS Take 100 mg by mouth daily.    Yes Historical Provider, MD  diltiazem (CARDIZEM CD) 180 MG 24 hr capsule Take 180 mg by mouth at bedtime.   Yes Historical Provider, MD  DULoxetine (CYMBALTA) 30 MG capsule Take 30 mg by mouth daily. Take along with 60mg  to make a total of 90mg    Yes Historical Provider, MD  DULoxetine (CYMBALTA) 60 MG capsule Take 60 mg by mouth daily. Take along with 30mg  capsule to make a total of 90mg    Yes Historical Provider, MD  estradiol (ESTRACE) 2 MG tablet Take 2 mg by mouth daily. 08/26/12  Yes Historical Provider, MD  etanercept (ENBREL) 50 MG/ML injection Inject 50 mg into the skin once a week. Tuesday   Yes Historical Provider, MD  ferrous sulfate 325 (65 FE) MG  EC tablet Take 325 mg by mouth daily with breakfast.   Yes Historical Provider, MD  FLAXSEED, LINSEED, PO Take 1,200 mg by mouth daily.   Yes Historical Provider, MD  furosemide (LASIX) 40 MG tablet Take 40 mg by mouth daily.   Yes Historical Provider, MD  gabapentin (NEURONTIN) 600 MG tablet Take 600 mg by mouth 2 (two) times daily.   Yes Historical Provider, MD  HYDROcodone-acetaminophen (NORCO/VICODIN) 5-325 MG per tablet Take 1 tablet by mouth every 6 (six) hours as needed for pain.   Yes Historical Provider, MD  Magnesium 250 MG TABS Take 1 tablet by mouth daily.   Yes Historical Provider, MD  metoprolol succinate (TOPROL-XL) 25 MG 24 hr tablet Take 25 mg by mouth daily. Take with Toprol XL  50mg  tablet   Yes Historical Provider, MD  metoprolol succinate (TOPROL-XL) 50 MG 24 hr tablet Take 100 mg by mouth daily. Take along with 25mg  toprol xl   Yes Historical Provider, MD  Multiple Vitamin (MULTIVITAMIN WITH MINERALS) TABS Take 1 tablet by mouth daily.   Yes Historical Provider, MD  omeprazole (PRILOSEC) 20 MG capsule Take 20 mg by mouth daily.   Yes Historical Provider, MD  potassium chloride SA (K-DUR,KLOR-CON) 20 MEQ tablet Take 40 mEq by mouth at bedtime.    Yes Historical Provider, MD  predniSONE (DELTASONE) 5 MG tablet Take 10 mg by mouth daily. 08/12/12  Yes Gaspar Garbe, MD  Vitamin D, Ergocalciferol, (DRISDOL) 50000 UNITS CAPS Take 50,000 Units by mouth every 7 (seven) days. Tuesday   Yes Historical Provider, MD     Results for orders placed during the hospital encounter of 11/06/12 (from the past 48 hour(s))  GLUCOSE, CAPILLARY     Status: None   Collection Time    11/06/12  9:39 AM      Result Value Range   Glucose-Capillary 76  70 - 99 mg/dL  CBC WITH DIFFERENTIAL     Status: Abnormal   Collection Time    11/06/12 10:15 AM      Result Value Range   WBC 18.9 (*) 4.0 - 10.5 K/uL   RBC 2.96 (*) 3.87 - 5.11 MIL/uL   Hemoglobin 6.3 (*) 12.0 - 15.0 g/dL   Comment: CRITICAL RESULT CALLED TO, READ BACK BY AND VERIFIED WITH:     BROWN M RN 11/06/12 1042 COSTELLO B     REPEATED TO VERIFY   HCT 22.7 (*) 36.0 - 46.0 %   MCV 76.7 (*) 78.0 - 100.0 fL   MCH 21.3 (*) 26.0 - 34.0 pg   MCHC 27.8 (*) 30.0 - 36.0 g/dL   RDW 40.9 (*) 81.1 - 91.4 %   Platelets 400  150 - 400 K/uL   Neutrophils Relative % 93 (*) 43 - 77 %   Lymphocytes Relative 5 (*) 12 - 46 %   Monocytes Relative 1 (*) 3 - 12 %   Eosinophils Relative 1  0 - 5 %   Basophils Relative 0  0 - 1 %   Neutro Abs 17.6 (*) 1.7 - 7.7 K/uL   Lymphs Abs 0.9  0.7 - 4.0 K/uL   Monocytes Absolute 0.2  0.1 - 1.0 K/uL   Eosinophils Absolute 0.2  0.0 - 0.7 K/uL   Basophils Absolute 0.0  0.0 - 0.1 K/uL   RBC Morphology  POLYCHROMASIA PRESENT     Comment: RARE NRBCs  COMPREHENSIVE METABOLIC PANEL     Status: Abnormal   Collection Time    11/06/12  10:15 AM      Result Value Range   Sodium 138  135 - 145 mEq/L   Potassium 3.7  3.5 - 5.1 mEq/L   Chloride 101  96 - 112 mEq/L   CO2 26  19 - 32 mEq/L   Glucose, Bld 87  70 - 99 mg/dL   BUN 15  6 - 23 mg/dL   Creatinine, Ser 5.62  0.50 - 1.10 mg/dL   Calcium 8.2 (*) 8.4 - 10.5 mg/dL   Total Protein 5.1 (*) 6.0 - 8.3 g/dL   Albumin 2.5 (*) 3.5 - 5.2 g/dL   AST 14  0 - 37 U/L   ALT 11  0 - 35 U/L   Alkaline Phosphatase 66  39 - 117 U/L   Total Bilirubin 0.2 (*) 0.3 - 1.2 mg/dL   GFR calc non Af Amer 70 (*) >90 mL/min   GFR calc Af Amer 82 (*) >90 mL/min   Comment: (NOTE)     The eGFR has been calculated using the CKD EPI equation.     This calculation has not been validated in all clinical situations.     eGFR's persistently <90 mL/min signify possible Chronic Kidney     Disease.  TYPE AND SCREEN     Status: None   Collection Time    11/06/12 11:10 AM      Result Value Range   ABO/RH(D) O NEG     Antibody Screen NEG     Sample Expiration 11/09/2012     Unit Number [id number]     Blood Component Type RED CELLS,LR     Unit division 00     Status of Unit ALLOCATED     Transfusion Status OK TO TRANSFUSE     Crossmatch Result Compatible     Unit Number [id number]     Blood Component Type RED CELLS,LR     Unit division 00     Status of Unit ALLOCATED     Transfusion Status OK TO TRANSFUSE     Crossmatch Result Compatible    ABO/RH     Status: None   Collection Time    11/06/12 11:10 AM      Result Value Range   ABO/RH(D) O NEG    PREPARE RBC (CROSSMATCH)     Status: None   Collection Time    11/06/12 11:10 AM      Result Value Range   Order Confirmation ORDER PROCESSED BY BLOOD BANK    CG4 I-STAT (LACTIC ACID)     Status: Abnormal   Collection Time    11/06/12 11:25 AM      Result Value Range   Lactic Acid, Venous 4.29 (*) 0.5 - 2.2  mmol/L  OCCULT BLOOD, POC DEVICE     Status: None   Collection Time    11/06/12 11:37 AM      Result Value Range   Fecal Occult Bld NEGATIVE  NEGATIVE  URINALYSIS, ROUTINE W REFLEX MICROSCOPIC     Status: None   Collection Time    11/06/12 12:54 PM      Result Value Range   Color, Urine YELLOW  YELLOW   APPearance CLEAR  CLEAR   Specific Gravity, Urine 1.013  1.005 - 1.030   pH 7.0  5.0 - 8.0   Glucose, UA NEGATIVE  NEGATIVE mg/dL   Hgb urine dipstick NEGATIVE  NEGATIVE   Bilirubin Urine NEGATIVE  NEGATIVE   Ketones, ur NEGATIVE  NEGATIVE mg/dL   Protein, ur  NEGATIVE  NEGATIVE mg/dL   Urobilinogen, UA 0.2  0.0 - 1.0 mg/dL   Nitrite NEGATIVE  NEGATIVE   Leukocytes, UA NEGATIVE  NEGATIVE   Comment: MICROSCOPIC NOT DONE ON URINES WITH NEGATIVE PROTEIN, BLOOD, LEUKOCYTES, NITRITE, OR GLUCOSE <1000 mg/dL.  LACTIC ACID, PLASMA     Status: Abnormal   Collection Time    11/06/12  2:19 PM      Result Value Range   Lactic Acid, Venous 5.5 (*) 0.5 - 2.2 mmol/L  PREPARE RBC (CROSSMATCH)     Status: None   Collection Time    11/06/12  2:57 PM      Result Value Range   Order Confirmation ORDER PROCESSED BY BLOOD BANK      Dg Chest 2 View  11/06/2012   CLINICAL DATA:  Fever, chills.  EXAM: CHEST  2 VIEW  COMPARISON:  08/09/2012  FINDINGS: The heart size and mediastinal contours are within normal limits. Both lungs are clear. Degenerative changes throughout the lower thoracic and visualized upper lumbar spine.  IMPRESSION: No active cardiopulmonary disease.   Electronically Signed   By: Charlett Nose M.D.   On: 11/06/2012 10:09    Review of Systems  Constitutional: Positive for fever, chills and malaise/fatigue.  HENT: Negative for congestion and sore throat.   Respiratory: Negative for cough and shortness of breath.   Cardiovascular: Negative for chest pain, orthopnea, leg swelling and PND.  Gastrointestinal: Negative for nausea, vomiting, diarrhea, blood in stool and melena.   Genitourinary: Negative for hematuria.  Neurological: Negative for dizziness.  All other systems reviewed and are negative.   Blood pressure 85/39, pulse 101, temperature 103.2 F (39.6 C), temperature source Rectal, resp. rate 17, height 4\' 11"  (1.499 m), weight 184 lb (83.462 kg), SpO2 100.00%. Physical Exam  Nursing note and vitals reviewed. Constitutional: She is oriented to person, place, and time. She appears well-developed. No distress.  Obese  HENT:  Head: Normocephalic and atraumatic.  Mouth/Throat: No oropharyngeal exudate.  Eyes: EOM are normal. Pupils are equal, round, and reactive to light. No scleral icterus.  Neck: Normal range of motion. Neck supple.  JVD not visible due to neck size.  Cardiovascular: Normal rate, S1 normal and S2 normal.  An irregularly irregular rhythm present.  Murmur heard.  Systolic murmur is present with a grade of 1/6  Pulses:      Radial pulses are 2+ on the right side, and 2+ on the left side.       Dorsalis pedis pulses are 2+ on the right side, and 2+ on the left side.       Posterior tibial pulses are 2+ on the right side, and 1+ on the left side.  No Carotid bruits.  Respiratory: Effort normal. She has wheezes.  GI: Soft. Bowel sounds are normal. She exhibits no distension. There is no tenderness.  Musculoskeletal: She exhibits edema.  Trace right LEE   Lymphadenopathy:    She has no cervical adenopathy.  Neurological: She is alert and oriented to person, place, and time. She exhibits normal muscle tone.  She is weak.  Skin: Skin is warm and dry.  Skin is very warm.    Psychiatric: She has a normal mood and affect.    Assessment/Plan: Principal Problem:   Severe sepsis Active Problems:   Atrial fibrillation   Hypotension   Dehydration   Psoriatic arthritis   Iron deficiency anemia   Depression   Peripheral neuropathy  Plan:  Currently maintaining NSR/Sinus tachycardia and  is hypotensive in the setting of sepsis.  She  is not complaining of any chest pain with a Hgb of 6.3.  Troponin is pending but I would not be surprise if it is mildly elevated.  Transfusion is in the works.   Fecal occult blood is negative.  Unless there are other signs of acute bleeding, continuing Eliquis for stroke prophylaxis would be a good idea.  She was in Afib on 10/20/12 when seen by Dr. Royann Shivers in the office and the current stress may make it more likely to go in again.   Iron studies ordered.  She was supposed to have a 2D echo today in our office.  It will be completed while here.  Hold diltiazem due to hypotension.  Her rate is not that fast and she is hypotensive.   On Vancomycin and Levofloxacin.  HAGER, BRYAN 11/06/2012, 3:51 PM    Patient seen and examined. Agree with assessment and plan. Very pleasant 72 yo WF PAF who recently was started on eliquis for anticoagulation. She apparently has converted to sinus rhythm since 10/20/12 when she was in AF at office.  She presents now with probable sepsis, profound anemia and had planned to see Dr. Loreta Ave this week. Hb today 6.3/ Hct 22.7 stool giuiac negative. No chest pain. WBC increased to 18.9K; currently on Levofloxin and Vancomycin.  She has been on Fe replacement with Fe<10 in July. Would hold eliquis presently since in sinus rhythm until reason for anemia better understood.  Since echo was not yet done in our office, recommend echo during hospital admission.  To get PRBC transfusion today.  Will follow.   Lennette Bihari, MD, [facility] 11/06/2012 4:35 PM

## 2012-11-06 NOTE — Progress Notes (Signed)
RN calling elink  Concern that patient has 4 units PRBC ordered for hgb 6's. Concern per note was for GIB and FOBT ordered and actually only 2 unit PRBC ordered by staff MD (but additional 2 ordered by PA student)  Review of chart  - baseline hgb 8s - currently only 2ghm% off in several months  - BP stable - no active bleed per RN  - FOBT - negative  Howeer,   - she has poor lactate clerarance. Cause unknonw  Plan  1 u nit prbc only; goal hgb > 7gm% Recheck cbc, lactate, pct after blood tx   Dr. Kalman Shan, M.D., [facility].C.P Pulmonary and Critical Care Medicine Staff Physician [facility] [place] Pulmonary and Critical Care Pager: [phone], If no answer or between  15:00h - 7:00h: call [phone]  11/06/2012 6:30 PM

## 2012-11-06 NOTE — ED Notes (Signed)
Called report to unit 2C. 

## 2012-11-06 NOTE — ED Notes (Signed)
Critical hgb reported to dr Redgie Grayer and code sepsi 2 called.

## 2012-11-06 NOTE — H&P (Addendum)
[facility] History and Physical  Andrea Yu JXB:[id number] DOB: [birth year] DOA: 11/06/2012  Referring physician:  PCP: Garlan Fillers, MD  Specialists:   Chief Complaint: Fever  HPI: Andrea Yu is a 72 y.o. female  History of psoriatic arthritis, atrial fibrillation recently started on Elquis, presents to the hospital for fever and rigors.  Patient states she woke up this morning was feeling okay. Her husband had actually fallen and injured his finger at which point the patient decided to help him manage his finger. At that time patient started feeling very feverish and had rigors.  She denies any recent travel any sick contacts.  She did receive her influenza vaccination in September of 2014.  Patient states that she does see a cardiologist. She was recently started on Eliquis 2 weeks ago for atrial fibrillation. Patient states she is on aspirin prior to that.  At this time patient denies any coughing, headache, wheezing, chest pain, shortness of breath, abdominal pain, nausea vomiting, diarrhea, vaginal discharge, insect bites, rash.  She denies any new medications.  Patient states she tried taking Tylenol however this did not help.  In the emergency room, patient was found to be hypotensive. She was given 1 L of IV fluids. She has responded slightly.  Review of Systems:  Constitutional: Denies diaphoresis, appetite change and fatigue.  HEENT: Denies photophobia, eye pain, redness, hearing loss, ear pain, congestion, sore throat, rhinorrhea, sneezing, mouth sores, trouble swallowing, neck pain, neck stiffness and tinnitus.   Respiratory: Denies SOB, DOE, cough, chest tightness, and wheezing.   Cardiovascular: Denies chest pain, palpitations and leg swelling.  Gastrointestinal: Denies nausea, vomiting, abdominal pain, diarrhea, constipation, blood in stool and abdominal distention.  Genitourinary: Denies dysuria, urgency, frequency, hematuria, flank pain and difficulty  urinating.  Musculoskeletal: Denies myalgias, back pain, joint swelling, arthralgias and gait problem.  Skin: Denies pallor, rash and wound.  Neurological: Denies dizziness, seizures, syncope, weakness, light-headedness, numbness and headaches. Has neuropathy which is chronic Hematological: Denies adenopathy. Easy bruising, personal or family bleeding history  Psychiatric/Behavioral: Denies suicidal ideation, mood changes, confusion, nervousness, sleep disturbance and agitation  Past Medical History  Diagnosis Date  . A-fib   . Hypertension   . Arthritis   . Peripheral neuropathy    Past Surgical History  Procedure Laterality Date  . Back surgery    . Replacement total knee bilateral    . Cholecystectomy    . Abdominal hysterectomy     Social History:  reports that she quit smoking about 31 years ago. She has never used smokeless tobacco. She reports that she does not drink alcohol or use illicit drugs. patient lives at home with her husband of 51 years. She does use a walker for ambulation.  Allergies  Allergen Reactions  . Penicillins Other (See Comments)    Throat swelling     History reviewed. No pertinent family history. Daughter had breast cancer, son died of an aneurysm, mother had liver cirrhosis.  Prior to Admission medications   Medication Sig Start Date End Date Taking? Authorizing Provider  apixaban (ELIQUIS) 5 MG TABS tablet Take 5 mg by mouth 2 (two) times daily.   Yes Historical Provider, MD  Ascorbic Acid (VITAMIN C) 100 MG tablet Take 100 mg by mouth at bedtime.   Yes Historical Provider, MD  aspirin EC 81 MG tablet Take 81 mg by mouth at bedtime.   Yes Historical Provider, MD  BIOTIN 5000 PO Take 5,000 mg by mouth at bedtime.  Yes Historical Provider, MD  celecoxib (CELEBREX) 200 MG capsule Take 200 mg by mouth at bedtime.   Yes Historical Provider, MD  Coenzyme Q10 (COQ-10) 100 MG CAPS Take 100 mg by mouth daily.    Yes Historical Provider, MD  diltiazem  (CARDIZEM CD) 180 MG 24 hr capsule Take 180 mg by mouth at bedtime.   Yes Historical Provider, MD  DULoxetine (CYMBALTA) 30 MG capsule Take 30 mg by mouth daily. Take along with 60mg  to make a total of 90mg    Yes Historical Provider, MD  DULoxetine (CYMBALTA) 60 MG capsule Take 60 mg by mouth daily. Take along with 30mg  capsule to make a total of 90mg    Yes Historical Provider, MD  estradiol (ESTRACE) 2 MG tablet Take 2 mg by mouth daily. 08/26/12  Yes Historical Provider, MD  etanercept (ENBREL) 50 MG/ML injection Inject 50 mg into the skin once a week. Tuesday   Yes Historical Provider, MD  ferrous sulfate 325 (65 FE) MG EC tablet Take 325 mg by mouth daily with breakfast.   Yes Historical Provider, MD  FLAXSEED, LINSEED, PO Take 1,200 mg by mouth daily.   Yes Historical Provider, MD  furosemide (LASIX) 40 MG tablet Take 40 mg by mouth daily.   Yes Historical Provider, MD  gabapentin (NEURONTIN) 600 MG tablet Take 600 mg by mouth 2 (two) times daily.   Yes Historical Provider, MD  HYDROcodone-acetaminophen (NORCO/VICODIN) 5-325 MG per tablet Take 1 tablet by mouth every 6 (six) hours as needed for pain.   Yes Historical Provider, MD  Magnesium 250 MG TABS Take 1 tablet by mouth daily.   Yes Historical Provider, MD  metoprolol succinate (TOPROL-XL) 25 MG 24 hr tablet Take 25 mg by mouth daily. Take with Toprol XL 50mg  tablet   Yes Historical Provider, MD  metoprolol succinate (TOPROL-XL) 50 MG 24 hr tablet Take 100 mg by mouth daily. Take along with 25mg  toprol xl   Yes Historical Provider, MD  Multiple Vitamin (MULTIVITAMIN WITH MINERALS) TABS Take 1 tablet by mouth daily.   Yes Historical Provider, MD  omeprazole (PRILOSEC) 20 MG capsule Take 20 mg by mouth daily.   Yes Historical Provider, MD  potassium chloride SA (K-DUR,KLOR-CON) 20 MEQ tablet Take 40 mEq by mouth at bedtime.    Yes Historical Provider, MD  predniSONE (DELTASONE) 5 MG tablet Take 10 mg by mouth daily. 08/12/12  Yes Gaspar Garbe, MD  Vitamin D, Ergocalciferol, (DRISDOL) 50000 UNITS CAPS Take 50,000 Units by mouth every 7 (seven) days. Tuesday   Yes Historical Provider, MD   Physical Exam: Filed Vitals:   11/06/12 1300  BP: 86/41  Pulse:   Temp:   Resp: 14     General: Well developed, well nourished, NAD, appears stated age  HEENT: NCAT, PERRLA, EOMI, Anicteic Sclera, mucous membranes dry. No pharyngeal erythema or exudates  Neck: Supple, no JVD, no masses,  Cardiovascular: S1 S2 auscultated, Irregularly irregular  Respiratory: Clear to auscultation bilaterally with equal chest rise  Abdomen: Soft, nontender, nondistended, + bowel sounds  Extremities: warm dry without cyanosis clubbing.  +2 edema in the lower extremities.  Patient does have wound on the right lower extremity.  Neuro: AAOx3, cranial nerves grossly intact. Sleepy. 3/5 motor strength in the upper extremities.    Skin: Without rashes exudates or nodules, wound noted to right lower extremity.  Psych: Normal affect and demeanor with intact judgement and insight  Labs on Admission:  Basic Metabolic Panel:  Recent Labs Lab  11/06/12 1015  NA 138  K 3.7  CL 101  CO2 26  GLUCOSE 87  BUN 15  CREATININE 0.81  CALCIUM 8.2*   Liver Function Tests:  Recent Labs Lab 11/06/12 1015  AST 14  ALT 11  ALKPHOS 66  BILITOT 0.2*  PROT 5.1*  ALBUMIN 2.5*   No results found for this basename: LIPASE, AMYLASE,  in the last 168 hours No results found for this basename: AMMONIA,  in the last 168 hours CBC:  Recent Labs Lab 11/06/12 1015  WBC 18.9*  NEUTROABS 17.6*  HGB 6.3*  HCT 22.7*  MCV 76.7*  PLT 400   Cardiac Enzymes: No results found for this basename: CKTOTAL, CKMB, CKMBINDEX, TROPONINI,  in the last 168 hours  BNP (last 3 results) No results found for this basename: PROBNP,  in the last 8760 hours CBG:  Recent Labs Lab 11/06/12 0939  GLUCAP 76    Radiological Exams on Admission: Dg Chest 2  View  11/06/2012   CLINICAL DATA:  Fever, chills.  EXAM: CHEST  2 VIEW  COMPARISON:  08/09/2012  FINDINGS: The heart size and mediastinal contours are within normal limits. Both lungs are clear. Degenerative changes throughout the lower thoracic and visualized upper lumbar spine.  IMPRESSION: No active cardiopulmonary disease.   Electronically Signed   By: Charlett Nose M.D.   On: 11/06/2012 10:09    EKG: Independently reviewed. Atrial fibrillation, rate of 101, normal axis  Assessment/Plan Principal Problem:   Severe sepsis Active Problems:   Atrial fibrillation   Hypotension   Dehydration   Psoriatic arthritis   Iron deficiency anemia   Depression   Peripheral neuropathy  Severe sepsis with lactic acidosis secondary to unknown source Will admit patient to the step down unit. Patient was only given one liter of normal saline in the emergency department. Her blood pressure is responding and maintaining. Will continue normal saline at 100 cc per hour and reassess.  Urine analysis negative, chest x-ray negative.  Awaiting blood cultures. Patient does not have a source of infection at this time. Continue Tylenol when necessary for fevers, will place patient on spectrum antibiotics of vancomycin and aztreonam with pharmacy to follow, as patient has penicillin allergy.  Patient is immunocompromised as she is on chronic prednisone therapy as well as Enbrel.  Iron deficiency anemia with questionable GI bleed Patient's baseline hemoglobin is approximately 8-10. She presents with a hemoglobin of 6.3. Patient will be type and screen. 2 units of blood will be ordered to transfuse. I have contacted Dr. Claretha Cooper for possible colonoscopy. 1 severe sepsis has subsided will reevaluate.  Will order iron studies, H&H q. 8 for 24 hours, Protonix IV.  Will hold patient's request at this time. Will also hold any NSAIDs.  Atrial fibrillation Patient was recently placed on Eliquis 2 weeks ago. This will be held.  Patient will be resumed on her diltiazem.  Will contact her cardiologist regarding this.   Hypotension likely secondary to sepsis Will continue monitoring and hold blood pressure medications at this time. Will continue IV fluids.  Psoriatic arthritis Will continue patient's prednisone.  Will hold Enbrel and Celebrex at this time due to GI bleed  Depression Continue Cymbalta.  Hypertension Holding metoprolol and lasix due to hypotension and sepsis.  DVT prophylaxis: SCDs  Code Status: DNR  Condition: Guarded Family Communication: Husband at bedside.  Admission, patients condition and plan of care including tests being ordered have been discussed with the patient and husband who indicate  understanding and agree with the plan and Code Status.  Disposition Plan: Admitted, expected length of stay   Time spent: 45 minutes  Deion Swift D.O. [facility] Pager [phone]  If 7PM-7AM, please contact night-coverage [email or web address] Password [facility] 11/06/2012, 1:39 PM  Received a call from the emergency department. Patient's blood pressure systolically is in the 70s. She's received 2 L of normal saline. However her blood pressure continues to drop. Patient has not received any blood at this time. Asked him to get blood as well as continuing bolusing her with normal saline. Also called PCCM for central line placement as patient will need pressors.  PCCM may need to take over care of the patient. Would now consider this to be septic shock.

## 2012-11-06 NOTE — ED Notes (Signed)
Lactic acid results given to tech for MD, shown to MD.

## 2012-11-06 NOTE — ED Notes (Signed)
cbg- 76 reported to morgan.

## 2012-11-07 ENCOUNTER — Inpatient Hospital Stay (HOSPITAL_COMMUNITY): Payer: Medicare Other

## 2012-11-07 ENCOUNTER — Encounter (HOSPITAL_COMMUNITY): Payer: Self-pay | Admitting: General Practice

## 2012-11-07 DIAGNOSIS — L4050 Arthropathic psoriasis, unspecified: Secondary | ICD-10-CM

## 2012-11-07 DIAGNOSIS — I517 Cardiomegaly: Secondary | ICD-10-CM

## 2012-11-07 DIAGNOSIS — I959 Hypotension, unspecified: Secondary | ICD-10-CM

## 2012-11-07 LAB — CBC
MCH: 23.1 pg — ABNORMAL LOW (ref 26.0–34.0)
MCV: 77.2 fL — ABNORMAL LOW (ref 78.0–100.0)
Platelets: 334 10*3/uL (ref 150–400)
RBC: 3.07 MIL/uL — ABNORMAL LOW (ref 3.87–5.11)
RDW: 20.1 % — ABNORMAL HIGH (ref 11.5–15.5)
WBC: 37.7 10*3/uL — ABNORMAL HIGH (ref 4.0–10.5)

## 2012-11-07 LAB — IRON AND TIBC
Iron: 50 ug/dL (ref 42–135)
Saturation Ratios: 13 % — ABNORMAL LOW (ref 20–55)

## 2012-11-07 LAB — GLUCOSE, CAPILLARY
Glucose-Capillary: 103 mg/dL — ABNORMAL HIGH (ref 70–99)
Glucose-Capillary: 144 mg/dL — ABNORMAL HIGH (ref 70–99)

## 2012-11-07 LAB — RETICULOCYTES
RBC.: 3.11 MIL/uL — ABNORMAL LOW (ref 3.87–5.11)
Retic Count, Absolute: 105.7 10*3/uL (ref 19.0–186.0)
Retic Ct Pct: 3.4 % — ABNORMAL HIGH (ref 0.4–3.1)

## 2012-11-07 LAB — URINE CULTURE
Colony Count: NO GROWTH
Culture: NO GROWTH

## 2012-11-07 LAB — COMPREHENSIVE METABOLIC PANEL
ALT: 13 U/L (ref 0–35)
AST: 21 U/L (ref 0–37)
Albumin: 2.1 g/dL — ABNORMAL LOW (ref 3.5–5.2)
Alkaline Phosphatase: 61 U/L (ref 39–117)
BUN: 13 mg/dL (ref 6–23)
CO2: 21 mEq/L (ref 19–32)
Calcium: 7.3 mg/dL — ABNORMAL LOW (ref 8.4–10.5)
Chloride: 108 mEq/L (ref 96–112)
Creatinine, Ser: 0.7 mg/dL (ref 0.50–1.10)
GFR calc Af Amer: >90 mL/min (ref >90)
GFR calc non Af Amer: 84 mL/min — ABNORMAL LOW (ref >90)
Glucose, Bld: 105 mg/dL — ABNORMAL HIGH (ref 70–99)
Potassium: 3.9 mEq/L (ref 3.5–5.1)
Sodium: 140 mEq/L (ref 135–145)
Total Bilirubin: 0.3 mg/dL (ref 0.3–1.2)
Total Protein: 4.6 g/dL — ABNORMAL LOW (ref 6.0–8.3)

## 2012-11-07 LAB — MAGNESIUM: Magnesium: 2 mg/dL (ref 1.5–2.5)

## 2012-11-07 LAB — TRANSFERRIN: Transferrin: 316 mg/dL (ref 200–360)

## 2012-11-07 LAB — HEMOGLOBIN AND HEMATOCRIT, BLOOD
HCT: 25 % — ABNORMAL LOW (ref 36.0–46.0)
HCT: 22.3 % — ABNORMAL LOW (ref 36.0–46.0)
HCT: 29.2 % — ABNORMAL LOW (ref 36.0–46.0)
Hemoglobin: 7.2 g/dL — ABNORMAL LOW (ref 12.0–15.0)
Hemoglobin: 6.7 g/dL — CL (ref 12.0–15.0)

## 2012-11-07 LAB — PROCALCITONIN
Procalcitonin: 6.13 ng/mL
Procalcitonin: 5.14 ng/mL

## 2012-11-07 LAB — FERRITIN: Ferritin: 23 ng/mL (ref 10–291)

## 2012-11-07 LAB — VITAMIN B12: Vitamin B-12: 290 pg/mL (ref 211–911)

## 2012-11-07 MED ORDER — LEVOFLOXACIN IN D5W 750 MG/150ML IV SOLN
750.0000 mg | INTRAVENOUS | Status: DC
  Administered 2012-11-07 – 2012-11-10 (×4): 750 mg via INTRAVENOUS
  Filled 2012-11-07 (×5): qty 150

## 2012-11-07 MED ORDER — DULOXETINE HCL 30 MG PO CPEP
90.0000 mg | ORAL_CAPSULE | Freq: Every day | ORAL | Status: DC
  Administered 2012-11-07 – 2012-11-11 (×5): 90 mg via ORAL
  Filled 2012-11-07 (×5): qty 1

## 2012-11-07 MED ORDER — FERROUS SULFATE 325 (65 FE) MG PO TABS
325.0000 mg | ORAL_TABLET | Freq: Two times a day (BID) | ORAL | Status: DC
  Administered 2012-11-08 – 2012-11-10 (×5): 325 mg via ORAL
  Filled 2012-11-07 (×7): qty 1

## 2012-11-07 MED ORDER — METOPROLOL TARTRATE 25 MG PO TABS: 25.0000 mg | ORAL_TABLET | Freq: Two times a day (BID) | ORAL | Status: DC

## 2012-11-07 MED ORDER — METOPROLOL TARTRATE 25 MG PO TABS
25.0000 mg | ORAL_TABLET | Freq: Two times a day (BID) | ORAL | Status: DC
  Administered 2012-11-07 – 2012-11-11 (×9): 25 mg via ORAL
  Filled 2012-11-07 (×10): qty 1

## 2012-11-07 MED ORDER — FERUMOXYTOL INJECTION 510 MG/17 ML: 510.0000 mg | Freq: Once | INTRAVENOUS | Status: DC

## 2012-11-07 MED ORDER — SODIUM CHLORIDE 0.9 % IV SOLN
1020.0000 mg | Freq: Once | INTRAVENOUS | Status: AC
  Administered 2012-11-07: 1020 mg via INTRAVENOUS
  Filled 2012-11-07: qty 34

## 2012-11-07 MED ORDER — PREDNISONE 10 MG PO TABS
10.0000 mg | ORAL_TABLET | Freq: Every day | ORAL | Status: DC
  Administered 2012-11-07 – 2012-11-11 (×5): 10 mg via ORAL
  Filled 2012-11-07 (×6): qty 1

## 2012-11-07 NOTE — Progress Notes (Signed)
  Echocardiogram 2D Echocardiogram has been performed.  Jorje Guild 11/07/2012, 8:53 AM

## 2012-11-07 NOTE — Progress Notes (Signed)
Subjective: No chest pain, no SOB  Objective: Vital signs in last 24 hours: Temp:  [99.1 F (37.3 C)-103.2 F (39.6 C)] 99.1 F (37.3 C) (10/14 0808) Pulse Rate:  [52-138] 78 (10/14 0700) Resp:  [12-25] 17 (10/14 0700) BP: (67-119)/(26-65) 104/43 mmHg (10/14 0700) SpO2:  [85 %-100 %] 100 % (10/14 0700) Weight:  [184 lb (83.462 kg)-194 lb 3.6 oz (88.1 kg)] 194 lb 3.6 oz (88.1 kg) (10/14 0600) Weight change:  Last BM Date: 11/05/12 Intake/Output from previous day: +3310 wt up from 184 to 194lbs 10/13 0701 - 10/14 0700 In: 3750 [I.V.:3250; Blood:350; IV Piggyback:150] Out: 440 [Urine:440] Intake/Output this shift:    PE: General:Pleasant affect, NAD Skin:Warm and dry, brisk capillary refill, very pale.  HEENT:normocephalic, sclera clear, mucus membranes moist Heart:S1S2 RRR without murmur, gallup, rub or click Lungs:clear, ant. without rales, rhonchi, or wheezes YNW:GNFA, non tender, + BS, do not palpate liver spleen or masses Ext:no lower ext edema, 2+ pedal pulses, 2+ radial pulses Neuro:alert and oriented, MAE, follows commands, + facial symmetry   Lab Results:  Recent Labs  11/06/12 2225 11/07/12 0334  WBC 42.3* 37.7*  HGB 7.2*  7.2* 7.1*  HCT 25.0*  24.2* 23.7*  PLT 341 334   BMET  Recent Labs  11/06/12 1015 11/07/12 0334  NA 138 140  K 3.7 3.9  CL 101 108  CO2 26 21  GLUCOSE 87 105*  BUN 15 13  CREATININE 0.81 0.70  CALCIUM 8.2* 7.3*    Recent Labs  11/06/12 1604  TROPONINI <0.30    No results found for this basename: CHOL, HDL, LDLCALC, LDLDIRECT, TRIG, CHOLHDL   No results found for this basename: HGBA1C     No results found for this basename: TSH    Hepatic Function Panel  Recent Labs  11/07/12 0334  PROT 4.6*  ALBUMIN 2.1*  AST 21  ALT 13  ALKPHOS 61  BILITOT 0.3    Studies/Results: Dg Chest 2 View  11/06/2012   CLINICAL DATA:  Fever, chills.  EXAM: CHEST  2 VIEW  COMPARISON:  08/09/2012  FINDINGS: The heart  size and mediastinal contours are within normal limits. Both lungs are clear. Degenerative changes throughout the lower thoracic and visualized upper lumbar spine.  IMPRESSION: No active cardiopulmonary disease.   Electronically Signed   By: Charlett Nose M.D.   On: 11/06/2012 10:09   Dg Chest Port 1 View  11/07/2012   CLINICAL DATA:  Shortness of breath, airspace disease, follow-up  EXAM: PORTABLE CHEST - 1 VIEW  COMPARISON:  Chest x-ray of 11/06/2012  FINDINGS: The lungs are clear although aeration is suboptimal. Heart size is stable and mediastinal contours are stable. There are degenerative changes noted in both shoulders.  IMPRESSION: No active lung disease.   Electronically Signed   By: Dwyane Dee M.D.   On: 11/07/2012 07:17    Medications: I have reviewed the patient's current medications. Scheduled Meds: . aspirin EC  81 mg Oral QHS  . diltiazem  180 mg Oral QHS  . DULoxetine  90 mg Oral Daily  . ferrous sulfate  325 mg Oral BID WC  . gabapentin  600 mg Oral BID  . hydrocortisone sod succinate (SOLU-CORTEF) inj  50 mg Intravenous Q6H  . levofloxacin (LEVAQUIN) IV  750 mg Intravenous Q24H  . magnesium oxide  200 mg Oral Daily  . multivitamin with minerals  1 tablet Oral Daily  . pantoprazole (PROTONIX) IV  40 mg Intravenous  QHS  . sodium chloride  3 mL Intravenous Q12H  . vancomycin  750 mg Intravenous Q12H  . Vitamin D (Ergocalciferol)  50,000 Units Oral Q7 days   Continuous Infusions: . sodium chloride 100 mL/hr at 11/07/12 0600   PRN Meds:.sodium chloride  Assessment/Plan: Principal Problem:   Shock circulatory Active Problems:   Adrenal insufficiency   Atrial fibrillation   Hypotension   Dehydration   Psoriatic arthritis   Iron deficiency anemia   Depression   Peripheral neuropathy   Systemic inflammatory response syndrome (SIRS)  PLAN: Hemoccult negative., B12 219, transfused 1 unit PRBCs.  Eliquis on hold.  Echo pending, neg MI. On PO Iron as outpt. ? Need for  IV Iron and Another unit of blood to two units. PAF during the night, now SR. Continues with low grade fever, VS stable, on Vanc. And Levaquin  LOS: 1 day   Time spent with pt. :20 minutes. [facility]  Nurse Practitioner Certified Pager [phone] 11/07/2012, 9:28 AM   I have seen and examined the patient along with Nada Boozer, NP.  I have reviewed the chart, notes and new data.  I agree with NP's note.  Key new complaints: Feels well Key examination changes: went from NSR to AF with controlled ventricular rate during my exam - appears completely oblivious to the rhythm change Key new findings / data: mild erythema of the right shin when compared to the left. Small superficial laceration just above the ankle  Echo shows no major structural abnormalities, except mild left atrial dilatation.  PLAN: Hypotension has resolved (sepsis +/- iatrogenic chronic adrenal insufficiency). Mild drop in Hgb is consistent with hemodilution after considerable volume administration, rather than evidence of acute bleeding. Long term there will be persistent conflict between need for anticoagulation and chronic Fe deficiency anemia of unclear cause. This may lead to a need to consider EP evaluation for RF ablation even in the absence of clear symptoms associated with AF.   Thurmon Fair, MD, [facility] [phone] 11/07/2012, 10:57 AM

## 2012-11-07 NOTE — Consult Note (Signed)
Reason for Consult: Anemia Referring Physician: [facility]  [name] HPI: This is a 72 year old female admitted for sepsis.  On the day of admission she was feeling unwell.  She had a temperature of 103 at home and this was followed by chills.  At that point they made the decision to go to the hospital, but she subsequently felt too weak.  In the ER she was found to be hypotensive and she was not responding to saline boluses.  The patient denied any history of cough, cold, dysuria, or diarrhea.  Her work up for an infectious cause is negative at this time, but her blood pressure has improved.  Currently she feels well.  Prior to the onset of her symptoms she was scheduled to see Dr. Loreta Ave for further evaluation of her anemia.  He has a history of chronic anemia with a baseline in the 8 range, but it has dropped down to the 6 range.  No reports of hematochezia, melena, hematuria, hemoptysis, or hematemesis.  The patient denied any prior colonoscopy.  Her FOBT is negative during this admission.  Past Medical History  Diagnosis Date  . A-fib   . Hypertension   . Arthritis   . Peripheral neuropathy     Past Surgical History  Procedure Laterality Date  . Back surgery    . Replacement total knee bilateral    . Cholecystectomy    . Abdominal hysterectomy      Family History  Problem Relation Age of Onset  . Heart attack Father     1951-deceased   . Aneurysm Son 65    Deceased     Social History:  reports that she quit smoking about 31 years ago. She has never used smokeless tobacco. She reports that she does not drink alcohol or use illicit drugs.  Allergies:  Allergies  Allergen Reactions  . Penicillins Other (See Comments)    Throat swelling     Medications:  Scheduled: . aspirin EC  81 mg Oral QHS  . diltiazem  180 mg Oral QHS  . DULoxetine  90 mg Oral Daily  . [START ON 11/08/2012] ferrous sulfate  325 mg Oral BID WC  . ferumoxytol  1,020 mg Intravenous Once  .  gabapentin  600 mg Oral BID  . levofloxacin (LEVAQUIN) IV  750 mg Intravenous Q24H  . magnesium oxide  200 mg Oral Daily  . metoprolol tartrate  25 mg Oral BID  . multivitamin with minerals  1 tablet Oral Daily  . pantoprazole (PROTONIX) IV  40 mg Intravenous QHS  . predniSONE  10 mg Oral Q breakfast  . sodium chloride  3 mL Intravenous Q12H  . Vitamin D (Ergocalciferol)  50,000 Units Oral Q7 days   Continuous: . sodium chloride 100 mL/hr at 11/07/12 0600    Results for orders placed during the hospital encounter of 11/06/12 (from the past 24 hour(s))  TROPONIN I     Status: None   Collection Time    11/06/12  4:04 PM      Result Value Range   Troponin I <0.30  <0.30 ng/mL  VITAMIN B12     Status: None   Collection Time    11/06/12  4:08 PM      Result Value Range   Vitamin B-12 219  211 - 911 pg/mL  FOLATE     Status: None   Collection Time    11/06/12  4:08 PM      Result Value  Range   Folate >20.0    IRON AND TIBC     Status: Abnormal   Collection Time    11/06/12  4:08 PM      Result Value Range   Iron <10 (*) 42 - 135 ug/dL   TIBC Not calculated due to Iron <10.  250 - 470 ug/dL   Saturation Ratios Not calculated due to Iron <10.  20 - 55 %   UIBC 330  125 - 400 ug/dL  FERRITIN     Status: None   Collection Time    11/06/12  4:08 PM      Result Value Range   Ferritin 11  10 - 291 ng/mL  RETICULOCYTES     Status: Abnormal   Collection Time    11/06/12  4:08 PM      Result Value Range   Retic Ct Pct 3.5 (*) 0.4 - 3.1 %   RBC. 2.48 (*) 3.87 - 5.11 MIL/uL   Retic Count, Manual 86.8  19.0 - 186.0 K/uL  MRSA PCR SCREENING     Status: None   Collection Time    11/06/12  4:40 PM      Result Value Range   MRSA by PCR NEGATIVE  NEGATIVE  GLUCOSE, CAPILLARY     Status: Abnormal   Collection Time    11/06/12  5:13 PM      Result Value Range   Glucose-Capillary 102 (*) 70 - 99 mg/dL  PREPARE RBC (CROSSMATCH)     Status: None   Collection Time    11/06/12  5:35 PM       Result Value Range   Order Confirmation ORDER PROCESSED BY BLOOD BANK    GLUCOSE, CAPILLARY     Status: None   Collection Time    11/06/12  8:23 PM      Result Value Range   Glucose-Capillary 99  70 - 99 mg/dL  HEMOGLOBIN AND HEMATOCRIT, BLOOD     Status: Abnormal   Collection Time    11/06/12 10:25 PM      Result Value Range   Hemoglobin 7.2 (*) 12.0 - 15.0 g/dL   HCT 16.1 (*) 09.6 - 04.5 %  VITAMIN B12     Status: None   Collection Time    11/06/12 10:25 PM      Result Value Range   Vitamin B-12 290  211 - 911 pg/mL  FERRITIN     Status: None   Collection Time    11/06/12 10:25 PM      Result Value Range   Ferritin 23  10 - 291 ng/mL  IRON AND TIBC     Status: Abnormal   Collection Time    11/06/12 10:25 PM      Result Value Range   Iron 50  42 - 135 ug/dL   TIBC 409  811 - 914 ug/dL   Saturation Ratios 13 (*) 20 - 55 %   UIBC 346  125 - 400 ug/dL  RETICULOCYTES     Status: Abnormal   Collection Time    11/06/12 10:25 PM      Result Value Range   Retic Ct Pct 3.4 (*) 0.4 - 3.1 %   RBC. 3.11 (*) 3.87 - 5.11 MIL/uL   Retic Count, Manual 105.7  19.0 - 186.0 K/uL  TRANSFERRIN     Status: None   Collection Time    11/06/12 10:25 PM      Result Value Range   Transferrin 316  200 - 360 mg/dL  LACTIC ACID, PLASMA     Status: None   Collection Time    11/06/12 10:25 PM      Result Value Range   Lactic Acid, Venous 2.2  0.5 - 2.2 mmol/L  PROCALCITONIN     Status: None   Collection Time    11/06/12 10:25 PM      Result Value Range   Procalcitonin 6.13    CBC     Status: Abnormal   Collection Time    11/06/12 10:25 PM      Result Value Range   WBC 42.3 (*) 4.0 - 10.5 K/uL   RBC 3.10 (*) 3.87 - 5.11 MIL/uL   Hemoglobin 7.2 (*) 12.0 - 15.0 g/dL   HCT 09.8 (*) 11.9 - 14.7 %   MCV 78.1  78.0 - 100.0 fL   MCH 23.2 (*) 26.0 - 34.0 pg   MCHC 29.8 (*) 30.0 - 36.0 g/dL   RDW 82.9 (*) 56.2 - 13.0 %   Platelets 341  150 - 400 K/uL  RETICULOCYTES     Status: Abnormal    Collection Time    11/06/12 10:25 PM      Result Value Range   Retic Ct Pct 3.4 (*) 0.4 - 3.1 %   RBC. 3.10 (*) 3.87 - 5.11 MIL/uL   Retic Count, Manual 105.4  19.0 - 186.0 K/uL  GLUCOSE, CAPILLARY     Status: Abnormal   Collection Time    11/07/12 12:27 AM      Result Value Range   Glucose-Capillary 103 (*) 70 - 99 mg/dL   Comment 1 Documented in Chart     Comment 2 Notify RN    CBC     Status: Abnormal   Collection Time    11/07/12  3:34 AM      Result Value Range   WBC 37.7 (*) 4.0 - 10.5 K/uL   RBC 3.07 (*) 3.87 - 5.11 MIL/uL   Hemoglobin 7.1 (*) 12.0 - 15.0 g/dL   HCT 86.5 (*) 78.4 - 69.6 %   MCV 77.2 (*) 78.0 - 100.0 fL   MCH 23.1 (*) 26.0 - 34.0 pg   MCHC 30.0  30.0 - 36.0 g/dL   RDW 29.5 (*) 28.4 - 13.2 %   Platelets 334  150 - 400 K/uL  MAGNESIUM     Status: None   Collection Time    11/07/12  3:34 AM      Result Value Range   Magnesium 2.0  1.5 - 2.5 mg/dL  COMPREHENSIVE METABOLIC PANEL     Status: Abnormal   Collection Time    11/07/12  3:34 AM      Result Value Range   Sodium 140  135 - 145 mEq/L   Potassium 3.9  3.5 - 5.1 mEq/L   Chloride 108  96 - 112 mEq/L   CO2 21  19 - 32 mEq/L   Glucose, Bld 105 (*) 70 - 99 mg/dL   BUN 13  6 - 23 mg/dL   Creatinine, Ser 4.40  0.50 - 1.10 mg/dL   Calcium 7.3 (*) 8.4 - 10.5 mg/dL   Total Protein 4.6 (*) 6.0 - 8.3 g/dL   Albumin 2.1 (*) 3.5 - 5.2 g/dL   AST 21  0 - 37 U/L   ALT 13  0 - 35 U/L   Alkaline Phosphatase 61  39 - 117 U/L   Total Bilirubin 0.3  0.3 - 1.2 mg/dL   GFR  calc non Af Amer 84 (*) >90 mL/min   GFR calc Af Amer >90  >90 mL/min  PROCALCITONIN     Status: None   Collection Time    11/07/12  3:34 AM      Result Value Range   Procalcitonin 5.14    GLUCOSE, CAPILLARY     Status: Abnormal   Collection Time    11/07/12  8:05 AM      Result Value Range   Glucose-Capillary 106 (*) 70 - 99 mg/dL  HEMOGLOBIN AND HEMATOCRIT, BLOOD     Status: Abnormal   Collection Time    11/07/12  9:10 AM       Result Value Range   Hemoglobin 6.7 (*) 12.0 - 15.0 g/dL   HCT 40.9 (*) 81.1 - 91.4 %     Dg Chest 2 View  11/06/2012   CLINICAL DATA:  Fever, chills.  EXAM: CHEST  2 VIEW  COMPARISON:  08/09/2012  FINDINGS: The heart size and mediastinal contours are within normal limits. Both lungs are clear. Degenerative changes throughout the lower thoracic and visualized upper lumbar spine.  IMPRESSION: No active cardiopulmonary disease.   Electronically Signed   By: Charlett Nose M.D.   On: 11/06/2012 10:09   Dg Chest Port 1 View  11/07/2012   CLINICAL DATA:  Shortness of breath, airspace disease, follow-up  EXAM: PORTABLE CHEST - 1 VIEW  COMPARISON:  Chest x-ray of 11/06/2012  FINDINGS: The lungs are clear although aeration is suboptimal. Heart size is stable and mediastinal contours are stable. There are degenerative changes noted in both shoulders.  IMPRESSION: No active lung disease.   Electronically Signed   By: Dwyane Dee M.D.   On: 11/07/2012 07:17    ROS:  As stated above in the HPI otherwise negative.  Blood pressure 116/53, pulse 72, temperature 98.2 F (36.8 C), temperature source Oral, resp. rate 20, height 4\' 11"  (1.499 m), weight 194 lb 3.6 oz (88.1 kg), SpO2 100.00%.    PE: Gen: NAD, Alert and Oriented HEENT:  [place]/AT, EOMI Neck: Supple, no LAD Lungs: CTA Bilaterally CV: RRR without M/G/R ABM: Soft, obese, NTND, +BS Ext: No C/C/E  Assessment/Plan: 1) Anemia. 2) Sepsis. 3) Afib.   She is stable at this time.  In fact, she is improved clinically and the plan is to transfer her to the floor.  There is no overt source of her infection.  She is not on any pressors at this time.  From a GI standpoint she requires further evaluation with an EGD/Colonoscopy and she is receiving blood transfusions.  The question is the timing of her procedures.  It is possible she can have the procedures as an outpatient if her HGB remains stable during this hospitalization.  Plan: 1) Follow HGB. 2)  Hold on EGD/Colonoscopy at this time. 3) We will continue to follow.  Shaila Gilchrest D 11/07/2012, 4:03 PM

## 2012-11-07 NOTE — Progress Notes (Signed)
CRITICAL VALUE ALERT  Critical value received: 6.7 Hgb  Date of notification:  11/07/12  Time of notification:  1003  Critical value read back: yes  Nurse who received alert:  Ree Edman, RN  MD notified (1st page):  Dr. Delton Coombes  Time of first page:  1015  MD notified (2nd page):  Time of second page:  Responding MD:  Dr. Delton Coombes  Time MD responded:

## 2012-11-07 NOTE — Consult Note (Signed)
WOC wound consult note Reason for Consult: evaluation of RLE wound.  Pt reports she was getting up to answer the phone and bumped her shin over the recliner, some type of metal pc on the chair. She reports she has had cellulitis in the LLE in the past and taking PO antibiotics for this.  She does have some overall redness, but not significant in the RLE. No edema and palpable pulses. Wound type: skin tear Pressure Ulcer POA: No Measurement:0.5cm x 1.5cm x 0.2cm  Wound bed: pink, moist, clean  Drainage (amount, consistency, odor) minimal serosanguinous  Periwound: intact Dressing procedure/placement/frequency: continue silicone foam dressing per skin care order set. This has been initiated per bedside nursing.  Change every 3 days and PRN.    Discussed POC with patient and bedside nurse.  Re consult if needed, will not follow at this time. Thanks  Ayaana Biondo [employer], CWOCN [phone])

## 2012-11-07 NOTE — Progress Notes (Addendum)
PULMONARY  / CRITICAL CARE MEDICINE  Name: Andrea Yu MRN: [id number] DOB: 1940/06/16    ADMISSION DATE:  11/06/2012 CONSULTATION DATE:  11/06/2012  REFERRING MD :  EDP/TRH PRIMARY SERVICE: PCCM  CHIEF COMPLAINT:  Fever, Weakness  BRIEF PATIENT DESCRIPTION: 27 F with hx of AF, HTN, psoriatic arthritis presented to [facility] ED 10/13 with fever and weakness x 7 - 10 days.Febrile, hypotensive in ED despite NS boluses.  TRH initially evaluated but hypotension persisted so admitted to ICU and PCCm consult requested  SIGNIFICANT EVENTS / STUDIES:  10/13 - Admit with hypotension, anemia, Hgb 6.3, elevated lactate (4.2) 10/14 Echo:    LINES / TUBES:  CULTURES: Blood 10/13 >>  Urine 10/13 >> UA negative  ANTIBIOTICS: Aztreonam 10/13 >>>10/13 Levofloxacin 10/13 >>  Vanc 10/13 >> 10/14  SUBJECTIVE:   States that she is feeling much better this am. No acute events overnight. Also had unexplained fever in July per her report.   VITAL SIGNS: Temp:  [98.3 F (36.8 C)-100.8 F (38.2 C)] 98.3 F (36.8 C) (10/14 1111) Pulse Rate:  [38-121] 38 (10/14 1300) Resp:  [12-25] 16 (10/14 1300) BP: (67-128)/(26-65) 114/52 mmHg (10/14 1300) SpO2:  [85 %-100 %] 99 % (10/14 1300) Weight:  [88.1 kg (194 lb 3.6 oz)] 88.1 kg (194 lb 3.6 oz) (10/14 0600)  HEMODYNAMICS:    VENTILATOR SETTINGS:    INTAKE / OUTPUT: Intake/Output     10/13 0701 - 10/14 0700 10/14 0701 - 10/15 0700   I.V. (mL/kg) 3350 (38) 600 (6.8)   Blood 350    IV Piggyback 150    Total Intake(mL/kg) 3850 (43.7) 600 (6.8)   Urine (mL/kg/hr) 440    Total Output 440     Net +3410 +600        Stool Occurrence 2 x      PHYSICAL EXAMINATION: General:  Elderly.  Sitting in chair comfortably  Neuro:  Cognition intact, strength 5/5 in all 4 extremities, numbness in BL LE HEENT:  [place]/AT.  MMM. Cardiovascular: RRR, no murmur Lungs:  CTAB, non labored,  Abdomen: SNTND, + BS Ext:  No edema, cool, 2+ DP  pulses   LABS:  CBC Recent Labs     11/06/12  1015  11/06/12  2225  11/07/12  0334  11/07/12  0910  WBC  18.9*  42.3*  37.7*   --   HGB  6.3*  7.2*  7.2*  7.1*  6.7*  HCT  22.7*  25.0*  24.2*  23.7*  22.3*  PLT  400  341  334   --    BMET Recent Labs     11/06/12  1015  11/07/12  0334  NA  138  140  K  3.7  3.9  CL  101  108  CO2  26  21  BUN  15  13  CREATININE  0.81  0.70  GLUCOSE  87  105*   Electrolytes Recent Labs     11/06/12  1015  11/07/12  0334  CALCIUM  8.2*  7.3*  MG   --   2.0   Liver Enzymes Recent Labs     11/06/12  1015  11/07/12  0334  AST  14  21  ALT  11  13  ALKPHOS  66  61  BILITOT  0.2*  0.3  ALBUMIN  2.5*  2.1*   Cardiac Enzymes Recent Labs     11/06/12  1604  TROPONINI  <0.30   Glucose Recent Labs  11/06/12  0939  11/06/12  1713  11/06/12  2023  11/07/12  0027  11/07/12  0805  GLUCAP  76  102*  99  103*  106*     CXR:     ASSESSMENT / PLAN:  CARDIOVASCULAR A: Shock - etiology unclear. Multifactorial suspected, improved. Consider septic, hypovolemic, adrenal insuff, cardiogenic in setting A fib... Atrial Fibrillation with RVR - resolved currently P:  Empiric hydrocortisone Echocardiogram read pending Cards following Hold eliquis for now with anemia Add back dilt and metoprolol (lower than her home dose)  HEMATOLOGIC A:  Microcytic anemia - acute on chronic P:  DVT px: SCDs Transfusion threshold 7 > PRBC 2u now Continue iron supplementation Monitor CBC q 12  GASTROINTESTINAL A: Possible chronic GI Bleed but FOB negative P:   SUP: IV pantoprazole Consider GI consult, but no urgent indication at this time. May merit their eval prior to discharge  INFECTIOUS A:  SIRS without clear infectious source Immunosuppressed - steroids, etanercept P:   Micro and abx as above > d/c vanco 10/14, probably d/c levaquin on 10/15 if no clear source identified  RHEUMATOLOGIC A: Psoriatic Arthritis   P: Steroids ordered > change back to pred 10 Hold Enbrel and Celebrex  ENDOCRINE A:   Adrenal insufficiency At risk for steroid induced hyperglycemia P:   Change back to pred home dose, watch for evidence adrenal insuff Monitor CBGs SSI if glu > 180  PULMONARY A: No acute issues. P:   - Monitor.  RENAL A:   No acute issues. P:   Monitor BMET intermittently Correct electrolytes as indicated  NEUROLOGIC A:  Depression - Chronic. P:   Holding duloxetine   Today's summary: Will await cultures and narrow antibiotics as possible. Monitor BP and rhythm closely, awaiting TTE. Transfer to floor today, back to [facility] as of 10/15.   Murtis Sink, MD [facility] Resident, PGY-2 11/07/2012, 1:56 PM  Levy Pupa, MD, PhD 11/07/2012, 2:02 PM [facility] [phone] or if no answer [phone]

## 2012-11-07 NOTE — Progress Notes (Signed)
ANTIBIOTIC CONSULT NOTE - FOLLOW UP  Pharmacy Consult for Levaquin Indication: r/o sepsis  Allergies  Allergen Reactions  . Penicillins Other (See Comments)    Throat swelling     Patient Measurements: Height: 4\' 11"  (149.9 cm) Weight: 194 lb 3.6 oz (88.1 kg) IBW/kg (Calculated) : 43.2  Vital Signs: Temp: 99.1 F (37.3 C) (10/14 0808) Temp src: Oral (10/14 0808) BP: 104/43 mmHg (10/14 0700) Pulse Rate: 78 (10/14 0700) Intake/Output from previous day: 10/13 0701 - 10/14 0700 In: 3750 [I.V.:3250; Blood:350; IV Piggyback:150] Out: 440 [Urine:440] Intake/Output from this shift:    Labs:  Recent Labs  11/06/12 1015 11/06/12 2225 11/07/12 0334  WBC 18.9* 42.3* 37.7*  HGB 6.3* 7.2*  7.2* 7.1*  PLT 400 341 334  CREATININE 0.81  --  0.70   Estimated Creatinine Clearance: 60.5 ml/min (by C-G formula based on Cr of 0.7). No results found for this basename: VANCOTROUGH, VANCOPEAK, VANCORANDOM, GENTTROUGH, GENTPEAK, GENTRANDOM, TOBRATROUGH, TOBRAPEAK, TOBRARND, AMIKACINPEAK, AMIKACINTROU, AMIKACIN,  in the last 72 hours   Microbiology: Recent Results (from the past 720 hour(s))  CULTURE, BLOOD (ROUTINE X 2)     Status: None   Collection Time    11/06/12 10:15 AM      Result Value Range Status   Specimen Description BLOOD RIGHT ARM   Final   Special Requests     Final   Value: BOTTLES DRAWN AEROBIC AND ANAEROBIC BLUE 10CC,RED 5CC   Culture  Setup Time     Final   Value: 11/06/2012 14:02     Performed at [facility]   Culture     Final   Value:        BLOOD CULTURE RECEIVED NO GROWTH TO DATE CULTURE WILL BE HELD FOR 5 DAYS BEFORE ISSUING A FINAL NEGATIVE REPORT     Performed at [facility]   Report Status PENDING   Incomplete  CULTURE, BLOOD (ROUTINE X 2)     Status: None   Collection Time    11/06/12 10:20 AM      Result Value Range Status   Specimen Description BLOOD RIGHT HAND   Final   Special Requests BOTTLES DRAWN AEROBIC ONLY 5CC   Final   Culture  Setup Time     Final   Value: 11/06/2012 14:02     Performed at [facility]   Culture     Final   Value:        BLOOD CULTURE RECEIVED NO GROWTH TO DATE CULTURE WILL BE HELD FOR 5 DAYS BEFORE ISSUING A FINAL NEGATIVE REPORT     Performed at [facility]   Report Status PENDING   Incomplete  MRSA PCR SCREENING     Status: None   Collection Time    11/06/12  4:40 PM      Result Value Range Status   MRSA by PCR NEGATIVE  NEGATIVE Final   Comment:            The GeneXpert MRSA Assay (FDA     approved for NASAL specimens     only), is one component of a     comprehensive MRSA colonization     surveillance program. It is not     intended to diagnose MRSA     infection nor to guide or     monitor treatment for     MRSA infections.    Anti-infectives   Start     Dose/Rate Route Frequency Ordered Stop  11/07/12 1200  levofloxacin (LEVAQUIN) IVPB 500 mg  Status:  Discontinued     500 mg 100 mL/hr over 60 Minutes Intravenous Every 24 hours 11/06/12 1242 11/07/12 0820   11/07/12 1200  levofloxacin (LEVAQUIN) IVPB 750 mg     750 mg 100 mL/hr over 90 Minutes Intravenous Every 24 hours 11/07/12 0820     11/07/12 0001  vancomycin (VANCOCIN) IVPB 750 mg/150 ml premix     750 mg 150 mL/hr over 60 Minutes Intravenous Every 12 hours 11/06/12 1242     11/06/12 2200  aztreonam (AZACTAM) 1 g in dextrose 5 % 50 mL IVPB  Status:  Discontinued     1 g 100 mL/hr over 30 Minutes Intravenous 3 times per day 11/06/12 1242 11/06/12 1454   11/06/12 2200  aztreonam (AZACTAM) 1 g in dextrose 5 % 50 mL IVPB  Status:  Discontinued     1 g 100 mL/hr over 30 Minutes Intravenous 3 times per day 11/06/12 1733 11/06/12 1747   11/06/12 1130  aztreonam (AZACTAM) 2 g in dextrose 5 % 50 mL IVPB     2 g 100 mL/hr over 30 Minutes Intravenous  Once 11/06/12 1056 11/06/12 1402   11/06/12 1115  vancomycin (VANCOCIN) 1,750 mg in sodium chloride 0.9 % 500 mL IVPB  Status:  Discontinued     1,750  mg 250 mL/hr over 120 Minutes Intravenous STAT 11/06/12 1109 11/06/12 1113   11/06/12 1115  vancomycin (VANCOCIN) IVPB 750 mg/150 ml premix     750 mg 150 mL/hr over 60 Minutes Intravenous STAT 11/06/12 1113 11/06/12 1401   11/06/12 1100  levofloxacin (LEVAQUIN) IVPB 750 mg     750 mg 100 mL/hr over 90 Minutes Intravenous  Once 11/06/12 1056 11/06/12 1556   11/06/12 1100  vancomycin (VANCOCIN) IVPB 1000 mg/200 mL premix     1,000 mg 200 mL/hr over 60 Minutes Intravenous  Once 11/06/12 1056 11/06/12 1403      Assessment: 72 y.o. F who continues on Levaquin monotherapy for r/o sepsis. Vancomycin d/ced today.  Tmax/24h: 103.2, WBC 37.7 < 42.3, SCr 0.7, CrCl~60 ml/min. Will increase Levaquin dose slightly today.  Vanc 10/13 >> 10/14 Levaquin 10/13 >>  Azactam 10/13 x 1 dose   10/13 BCx >> ngtd 10/13 UCx >> NG  Goal of Therapy:  Proper antibiotics for infection/cultures adjusted for renal/hepatic function   Plan:  1. Increase Levaquin to 750 mg IV every 24 hours 2. Will continue to follow renal function, culture results, LOT, and antibiotic de-escalation plans   Georgina Pillion, PharmD, BCPS Clinical Pharmacist Pager: [phone] 11/07/2012 2:38 PM

## 2012-11-08 LAB — CBC
HCT: 28.8 % — ABNORMAL LOW (ref 36.0–46.0)
Hemoglobin: 9.3 g/dL — ABNORMAL LOW (ref 12.0–15.0)
MCH: 25.4 pg — ABNORMAL LOW (ref 26.0–34.0)
MCHC: 32.3 g/dL (ref 30.0–36.0)
MCV: 78.7 fL (ref 78.0–100.0)
Platelets: 244 10*3/uL (ref 150–400)
RBC: 3.66 MIL/uL — ABNORMAL LOW (ref 3.87–5.11)
RDW: 19 % — ABNORMAL HIGH (ref 11.5–15.5)
WBC: 31.6 10*3/uL — ABNORMAL HIGH (ref 4.0–10.5)

## 2012-11-08 LAB — GLUCOSE, CAPILLARY
Glucose-Capillary: 100 mg/dL — ABNORMAL HIGH (ref 70–99)
Glucose-Capillary: 87 mg/dL (ref 70–99)
Glucose-Capillary: 99 mg/dL (ref 70–99)

## 2012-11-08 LAB — TYPE AND SCREEN
ABO/RH(D): O NEG
Antibody Screen: NEGATIVE
Unit division: 0

## 2012-11-08 LAB — HEMOGLOBIN AND HEMATOCRIT, BLOOD: Hemoglobin: 9.5 g/dL — ABNORMAL LOW (ref 12.0–15.0)

## 2012-11-08 MED ORDER — ETANERCEPT 50 MG/ML ~~LOC~~ SOLN: 50.0000 mg | SUBCUTANEOUS | Status: DC

## 2012-11-08 MED ORDER — FUROSEMIDE 10 MG/ML IJ SOLN
40.0000 mg | Freq: Once | INTRAMUSCULAR | Status: AC
  Administered 2012-11-08: 40 mg via INTRAVENOUS
  Filled 2012-11-08: qty 4

## 2012-11-08 MED ORDER — CYANOCOBALAMIN 1000 MCG/ML IJ SOLN
1000.0000 ug | Freq: Every day | INTRAMUSCULAR | Status: DC
  Administered 2012-11-08 – 2012-11-10 (×3): 1000 ug via SUBCUTANEOUS
  Filled 2012-11-08 (×4): qty 1

## 2012-11-08 MED ORDER — LEVALBUTEROL HCL 0.63 MG/3ML IN NEBU
0.6300 mg | INHALATION_SOLUTION | Freq: Three times a day (TID) | RESPIRATORY_TRACT | Status: DC
  Administered 2012-11-08 – 2012-11-10 (×5): 0.63 mg via RESPIRATORY_TRACT
  Filled 2012-11-08 (×10): qty 3

## 2012-11-08 MED ORDER — PANTOPRAZOLE SODIUM 40 MG PO TBEC
40.0000 mg | DELAYED_RELEASE_TABLET | Freq: Every day | ORAL | Status: DC
  Administered 2012-11-08 – 2012-11-11 (×4): 40 mg via ORAL
  Filled 2012-11-08 (×4): qty 1

## 2012-11-08 NOTE — Progress Notes (Signed)
Subjective: Complains of orthopnea.   Objective: Vital signs in last 24 hours: Temp:  [97.5 F (36.4 C)-98.4 F (36.9 C)] 97.5 F (36.4 C) (10/15 0422) Pulse Rate:  [38-121] 83 (10/15 0422) Resp:  [14-24] 20 (10/15 0422) BP: (99-151)/(48-74) 139/71 mmHg (10/15 0422) SpO2:  [95 %-100 %] 96 % (10/15 0422) Weight:  [200 lb 6.4 oz (90.9 kg)] 200 lb 6.4 oz (90.9 kg) (10/15 0422) Last BM Date: 11/07/12  Intake/Output from previous day: 10/14 0701 - 10/15 0700 In: 1525 [I.V.:1000; Blood:375; IV Piggyback:150] Out: -  Intake/Output this shift:    Medications Current Facility-Administered Medications  Medication Dose Route Frequency Provider Last Rate Last Dose  . 0.9 %  sodium chloride infusion   Intravenous Continuous Maryann Mikhail, DO 100 mL/hr at 11/08/12 0521    . 0.9 %  sodium chloride infusion  250 mL Intravenous PRN Jeanella Craze, NP      . aspirin EC tablet 81 mg  81 mg Oral QHS Maryann Mikhail, DO   81 mg at 11/07/12 2125  . diltiazem (CARDIZEM CD) 24 hr capsule 180 mg  180 mg Oral QHS Maryann Mikhail, DO   180 mg at 11/07/12 2126  . DULoxetine (CYMBALTA) DR capsule 90 mg  90 mg Oral Daily Lonia Blood, MD   90 mg at 11/07/12 0950  . ferrous sulfate tablet 325 mg  325 mg Oral BID WC Calvert Cantor, MD   325 mg at 11/08/12 0754  . gabapentin (NEURONTIN) tablet 600 mg  600 mg Oral BID Maryann Mikhail, DO   600 mg at 11/07/12 2117  . levofloxacin (LEVAQUIN) IVPB 750 mg  750 mg Intravenous Q24H Ann Held, RPH   750 mg at 11/07/12 1225  . magnesium oxide (MAG-OX) tablet 200 mg  200 mg Oral Daily Maryann Mikhail, DO   200 mg at 11/07/12 0950  . metoprolol tartrate (LOPRESSOR) tablet 25 mg  25 mg Oral BID Stephanie [address]oup [address], MD   25 mg at 11/07/12 2117  . multivitamin with minerals tablet 1 tablet  1 tablet Oral Daily Maryann Mikhail, DO   1 tablet at 11/07/12 0950  . pantoprazole (PROTONIX) injection 40 mg  40 mg Intravenous QHS Jeanella Craze, NP   40 mg at  11/07/12 2117  . predniSONE (DELTASONE) tablet 10 mg  10 mg Oral Q breakfast Stephanie [address], MD   10 mg at 11/08/12 0754  . sodium chloride 0.9 % injection 3 mL  3 mL Intravenous Q12H Maryann Mikhail, DO   3 mL at 11/07/12 2126  . Vitamin D (Ergocalciferol) (DRISDOL) capsule 50,000 Units  50,000 Units Oral Q7 days Edsel Petrin, DO   50,000 Units at 11/07/12 0950    PE: General appearance: alert, cooperative and no distress Lungs: Bilateral rales and wheeze. Heart: irregularly irregular rhythm Extremities: 1+ Right LEE Pulses: 2+ and symmetric Skin: Warm and dry Neurologic: Grossly normal  Lab Results:   Recent Labs  11/06/12 2225 11/07/12 0334 11/07/12 0910 11/07/12 2026 11/08/12 0120  WBC 42.3* 37.7*  --   --  31.6*  HGB 7.2*  7.2* 7.1* 6.7* 9.4* 9.3*  HCT 25.0*  24.2* 23.7* 22.3* 29.2* 28.8*  PLT 341 334  --   --  244   BMET  Recent Labs  11/06/12 1015 11/07/12 0334  NA 138 140  K 3.7 3.9  CL 101 108  CO2 26 21  GLUCOSE 87 105*  BUN 15 13  CREATININE 0.81 0.70  CALCIUM  8.2* 7.3*    Assessment/Plan  Principal Problem:   Shock circulatory Active Problems:   Adrenal insufficiency   Atrial fibrillation   Hypotension   Dehydration   Psoriatic arthritis   Iron deficiency anemia   Depression   Peripheral neuropathy   Systemic inflammatory response syndrome (SIRS)  Plan:  Currently in afib with controlled rate.  Hgb stable.  Echo: Normal EF and wall motion.  Pa pressure: . She sounds wet.  Will give a dose of IV lasix 40 this morning and reassess this afternoon..  Net fluids are +1.5L and +5.0L since adm.  Wt up six pounds.  Also give Xopenex nebs.    LOS: 2 days    HAGER, BRYAN 11/08/2012 9:22 AM  I have seen and examined the patient along with Wilburt Finlay, NP.  I have reviewed the chart, notes and new data.  I agree with PA's note.  Key new complaints: no fever or chills, clearly dyspneic Key examination changes: edematous Key new  findings / data: Hgb stable around 9.3 after transfusion  Although the right leg findings are not impressive, I suspect she does have cellulitis. The right calf is clearly more swollen, warmer and pinker than the pale left calf. Inflammatory findings might be subdued because of chronic immunosuppressive therapy.  PLAN: Diuresis. If there is no overt bleeding, I would resume anticoagulation - her anemia seems to be iron deficiency rather than acute blood loss. She ha appointment with Dr. Loreta Ave next Thursday. Eliquis can be stopped 24 hours before colonoscopy or EGD.  Thurmon Fair, MD, [facility] [phone] 11/08/2012, 11:19 AM

## 2012-11-08 NOTE — Progress Notes (Signed)
Subjective: Patient seems to be doing significantly better since admission and denies any abdominal pain, nausea or vomiting. There is no history of GI bleeding. She was admitted with sepsis, hypotension and circulatory shock after she presented with a 7 day history of fevers and weakness. The source of her sepsis is not clear yet. She is aware of the fact that she needs an EGD and a colonoscopy but is not ready for these procedures at the present time. She claims she has never had a colonoscopy. She has seen Dr. Ritta Slot in the past for a liver biopsy when she was on Methotrexate for her psoriatic arthritis.    Objective: Vital signs in last 24 hours: Temp:  [97.5 F (36.4 C)-98.4 F (36.9 C)] 98.1 F (36.7 C) (10/15 1313) Pulse Rate:  [67-84] 79 (10/15 1313) Resp:  [18-24] 18 (10/15 1313) BP: (116-139)/(53-78) 122/78 mmHg (10/15 1313) SpO2:  [96 %-100 %] 96 % (10/15 1313) Weight:  [90.9 kg (200 lb 6.4 oz)] 90.9 kg (200 lb 6.4 oz) (10/15 0422) Last BM Date: 11/07/12  Intake/Output from previous day: 10/14 0701 - 10/15 0700 In: 1525 [I.V.:1000; Blood:375; IV Piggyback:150] Out: -  Intake/Output this shift:   General appearance: alert, cooperative, appears stated age, fatigued and no distress Resp: clear to auscultation bilaterally Cardio: regular rate and rhythm, S1, S2 normal, no murmur, click, rub or gallop GI: soft, obese, non-tender; bowel sounds normal; no masses,  no organomegaly Extremities: extremities normal, atraumatic, no cyanosis or edema  Lab Results:  Recent Labs  11/06/12 2225 11/07/12 0334  11/07/12 2026 11/08/12 0120 11/08/12 0920  WBC 42.3* 37.7*  --   --  31.6*  --   HGB 7.2*  7.2* 7.1*  < > 9.4* 9.3* 9.5*  HCT 25.0*  24.2* 23.7*  < > 29.2* 28.8* 30.1*  PLT 341 334  --   --  244  --   < > = values in this interval not displayed. BMET  Recent Labs  11/06/12 1015 11/07/12 0334  NA 138 140  K 3.7 3.9  CL 101 108  CO2 26 21  GLUCOSE 87 105*   BUN 15 13  CREATININE 0.81 0.70  CALCIUM 8.2* 7.3*   LFT  Recent Labs  11/07/12 0334  PROT 4.6*  ALBUMIN 2.1*  AST 21  ALT 13  ALKPHOS 61  BILITOT 0.3   Studies/Results: Dg Chest Port 1 View  11/07/2012   CLINICAL DATA:  Shortness of breath, airspace disease, follow-up  EXAM: PORTABLE CHEST - 1 VIEW  COMPARISON:  Chest x-ray of 11/06/2012  FINDINGS: The lungs are clear although aeration is suboptimal. Heart size is stable and mediastinal contours are stable. There are degenerative changes noted in both shoulders.  IMPRESSION: No active lung disease.   Electronically Signed   By: Dwyane Dee M.D.   On: 11/07/2012 07:17   Medications: I have reviewed the patient's current medications.  Assessment/Plan: 1) SIRS improved.  2) Iron deficiency anemia: Her hemoglobin has been stable since her transfusions. We will hold off on any procedures till her acute medical problems stabilize. She seems to be doing better today. No ready on prepping for a colonoscopy at this time.  3) Psoriatic arthritis on Enbrel at home.  4) Atrial fibrillation. 5) Depression.  LOS: 2 days   Jimi Giza 11/08/2012, 5:35 PM

## 2012-11-08 NOTE — Progress Notes (Signed)
[facility] Progress Note Andrea Yu   Andrea Yu ZOX:[id number] DOB: November 23, 1940 DOA: 11/06/2012 PCP: Garlan Fillers, MD  Admit HPI / Brief Narrative: 47 F with hx of AF, HTN, psoriatic arthritis presented to [facility] ED 10/13 with fever and weakness x 7 - 10 days.  PT was noted to be febrile and hypotensive in the ED despite NS boluses. [facility] initially evaluated the pt but hypotension persisted so she was admitted to ICU via PCCM.  Assessment/Plan:  SIRS (HR 107, WBC 18.9) w/ Shock with lactic acidosis secondary to unknown source Consider septic, hypovolemic, adrenal insuff, cardiogenic in setting A fib - SIRS physiology has now resolved- clear etiology still not found, but could be related to apparent R LE cellulitis   R LE cellulitis  Pt has wound on R shin - R leg is warmer than L on touch, and is erythematous - WBC trending down - pt is not febrile - cont current abx and follow exam - check doppler to r/o DVT as well - of note MRSA screen was negative   Iron deficiency anemia Pt has received an IV Fe load - pt to have outpt GI evaluation   B12 deficiency  B12 was 219 - replace with SQ load and f/u in 6-8 weeks as outpt   Questionable chronic GI bleed FOB negative - she requires further evaluation with an EGD/Colonoscopy - GI has seen, and will determine timing of this   Chronic Parox Atrial fibrillation w RVR As per Cardiology - agree pt is high risk for embolic complications - will plan to resume anticoag in AM if Hgb remains stable unless GI decides to proceed w/ endoscopic eval this admit   Adrenal insufficiency On chronic steroids  Psoriatic arthritis Resuming home medical tx  Depression  Hypertension BP currently well controlled   Code Status: NO CODE Family Communication: no family present at time of exam Disposition Plan: tele bed - follow CBC - possible D/C next 24-48hrs depending on stability of HgB - PT/OT to  see  Consultants: PCCM >> [facility] Cardiology GI  Procedures: TTE - Normal EF and wall motion - Pa pressure:  Antibiotics: Aztreonam 10/13 >>>10/13  Levofloxacin 10/13 >>  Vanc 10/13 >> 10/14   DVT prophylaxis: SCDs  HPI/Subjective: Patient is resting comfortably in her hospital bed.  She admits to significant shortness of breath this morning but states that this resolved after the Lasix dose cardiology gave her.  She denies chest pain fevers chills nausea or vomiting.  She denies significant pain in her right lower extremity.  Objective: Blood pressure 131/70, pulse 84, temperature 97.5 F (36.4 C), temperature source Oral, resp. rate 20, height 4\' 11"  (1.499 m), weight 90.9 kg (200 lb 6.4 oz), SpO2 96.00%.  Intake/Output Summary (Last 24 hours) at 11/08/12 1309 Last data filed at 11/07/12 1800  Gross per 24 hour  Intake    775 ml  Output      0 ml  Net    775 ml   Exam: General: No acute respiratory distress at rest Lungs:  Faint bibasilar crackles but no wheeze with good air movement throughout other fields Cardiovascular: Regular rate and rhythm without murmur gallop or rub normal S1 and S2 Abdomen: Nontender, nondistended, soft, bowel sounds positive, no rebound, no ascites, no appreciable mass Extremities: No significant cyanosis, clubbing, or edema bilateral lower extremities - the right lower extremity is mildly erythematous and warm to touch compared to the left - the  patient has an approximately 1 inch wound on the anterior aspect of the right shin with no obvious purulent drainage but with surrounding erythema  Data Reviewed: Basic Metabolic Panel:  Recent Labs Lab 11/06/12 1015 11/07/12 0334  NA 138 140  K 3.7 3.9  CL 101 108  CO2 26 21  GLUCOSE 87 105*  BUN 15 13  CREATININE 0.81 0.70  CALCIUM 8.2* 7.3*  MG  --  2.0   Liver Function Tests:  Recent Labs Lab 11/06/12 1015 11/07/12 0334  AST 14 21  ALT 11 13  ALKPHOS 66 61  BILITOT 0.2* 0.3   PROT 5.1* 4.6*  ALBUMIN 2.5* 2.1*   CBC:  Recent Labs Lab 11/06/12 1015 11/06/12 2225 11/07/12 0334 11/07/12 0910 11/07/12 2026 11/08/12 0120 11/08/12 0920  WBC 18.9* 42.3* 37.7*  --   --  31.6*  --   NEUTROABS 17.6*  --   --   --   --   --   --   HGB 6.3* 7.2*  7.2* 7.1* 6.7* 9.4* 9.3* 9.5*  HCT 22.7* 25.0*  24.2* 23.7* 22.3* 29.2* 28.8* 30.1*  MCV 76.7* 78.1 77.2*  --   --  78.7  --   PLT 400 341 334  --   --  244  --    CBG:  Recent Labs Lab 11/06/12 2023 11/07/12 0027 11/07/12 0805 11/07/12 1633 11/08/12 0816  GLUCAP 99 103* 106* 144* 99    Recent Results (from the past 240 hour(s))  CULTURE, BLOOD (ROUTINE X 2)     Status: None   Collection Time    11/06/12 10:15 AM      Result Value Range Status   Specimen Description BLOOD RIGHT ARM   Final   Special Requests     Final   Value: BOTTLES DRAWN AEROBIC AND ANAEROBIC BLUE 10CC,RED 5CC   Culture  Setup Time     Final   Value: 11/06/2012 14:02     Performed at [facility]   Culture     Final   Value:        BLOOD CULTURE RECEIVED NO GROWTH TO DATE CULTURE WILL BE HELD FOR 5 DAYS BEFORE ISSUING A FINAL NEGATIVE REPORT     Performed at [facility]   Report Status PENDING   Incomplete  CULTURE, BLOOD (ROUTINE X 2)     Status: None   Collection Time    11/06/12 10:20 AM      Result Value Range Status   Specimen Description BLOOD RIGHT HAND   Final   Special Requests BOTTLES DRAWN AEROBIC ONLY 5CC   Final   Culture  Setup Time     Final   Value: 11/06/2012 14:02     Performed at [facility]   Culture     Final   Value:        BLOOD CULTURE RECEIVED NO GROWTH TO DATE CULTURE WILL BE HELD FOR 5 DAYS BEFORE ISSUING A FINAL NEGATIVE REPORT     Performed at [facility]   Report Status PENDING   Incomplete  URINE CULTURE     Status: None   Collection Time    11/06/12 12:55 PM      Result Value Range Status   Specimen Description URINE, CATHETERIZED   Final   Special  Requests NONE   Final   Culture  Setup Time     Final   Value: 11/06/2012 16:33     Performed at [facility]   Colony Count     Final   Value: NO GROWTH     Performed at [facility]   Culture     Final   Value: NO GROWTH     Performed at [facility]   Report Status 11/07/2012 FINAL   Final  MRSA PCR SCREENING     Status: None   Collection Time    11/06/12  4:40 PM      Result Value Range Status   MRSA by PCR NEGATIVE  NEGATIVE Final   Comment:            The GeneXpert MRSA Assay (FDA     approved for NASAL specimens     only), is one component of a     comprehensive MRSA colonization     surveillance program. It is not     intended to diagnose MRSA     infection nor to guide or     monitor treatment for     MRSA infections.     Studies:  Recent x-ray studies have been reviewed in detail by the Attending Physician  Scheduled Meds:  Scheduled Meds: . aspirin EC  81 mg Oral QHS  . diltiazem  180 mg Oral QHS  . DULoxetine  90 mg Oral Daily  . ferrous sulfate  325 mg Oral BID WC  . gabapentin  600 mg Oral BID  . levalbuterol  0.63 mg Nebulization TID  . levofloxacin (LEVAQUIN) IV  750 mg Intravenous Q24H  . magnesium oxide  200 mg Oral Daily  . metoprolol tartrate  25 mg Oral BID  . multivitamin with minerals  1 tablet Oral Daily  . pantoprazole  40 mg Oral Daily  . predniSONE  10 mg Oral Q breakfast  . sodium chloride  3 mL Intravenous Q12H  . Vitamin D (Ergocalciferol)  50,000 Units Oral Q7 days    Time spent on care of this patient: 35 mins   [facility] T  [facility] Office  [phone] Pager - Text Page per Loretha Stapler as per below:  On-Call/Text Page:      [email or web address]      password [email or web address]  If 7PM-7AM, please contact night-coverage [email or web address] Password [email or web address] 11/08/2012, 1:09 PM   LOS: 2 days

## 2012-11-08 NOTE — Progress Notes (Signed)
Around 2200 pt's hr went up to the 140s non-sustaining while using he bsc. Will continue to monitor the pt. Sanda Linger, RN

## 2012-11-09 DIAGNOSIS — M7989 Other specified soft tissue disorders: Secondary | ICD-10-CM

## 2012-11-09 LAB — GLUCOSE, CAPILLARY: Glucose-Capillary: 88 mg/dL (ref 70–99)

## 2012-11-09 LAB — CBC
Hemoglobin: 10 g/dL — ABNORMAL LOW (ref 12.0–15.0)
MCH: 25.1 pg — ABNORMAL LOW (ref 26.0–34.0)
MCHC: 31.7 g/dL (ref 30.0–36.0)
Platelets: 313 10*3/uL (ref 150–400)
RBC: 3.99 MIL/uL (ref 3.87–5.11)
WBC: 17.3 10*3/uL — ABNORMAL HIGH (ref 4.0–10.5)

## 2012-11-09 MED ORDER — FUROSEMIDE 40 MG PO TABS
40.0000 mg | ORAL_TABLET | Freq: Every day | ORAL | Status: DC
  Administered 2012-11-10 – 2012-11-11 (×2): 40 mg via ORAL
  Filled 2012-11-09 (×2): qty 1

## 2012-11-09 MED ORDER — PEG 3350-KCL-NA BICARB-NACL 420 G PO SOLR
4000.0000 mL | Freq: Once | ORAL | Status: AC
  Administered 2012-11-09: 4000 mL via ORAL
  Filled 2012-11-09: qty 4000

## 2012-11-09 MED ORDER — ACETAMINOPHEN 325 MG PO TABS
650.0000 mg | ORAL_TABLET | ORAL | Status: DC | PRN
  Administered 2012-11-09: 650 mg via ORAL
  Filled 2012-11-09: qty 2

## 2012-11-09 MED ORDER — SODIUM CHLORIDE 0.9 % IV SOLN
INTRAVENOUS | Status: DC
  Administered 2012-11-10: 06:00:00 via INTRAVENOUS

## 2012-11-09 MED ORDER — APIXABAN 5 MG PO TABS
5.0000 mg | ORAL_TABLET | Freq: Two times a day (BID) | ORAL | Status: DC
  Filled 2012-11-09: qty 1

## 2012-11-09 MED ORDER — DILTIAZEM HCL 60 MG PO TABS
60.0000 mg | ORAL_TABLET | Freq: Four times a day (QID) | ORAL | Status: AC
  Administered 2012-11-09 – 2012-11-10 (×7): 60 mg via ORAL
  Filled 2012-11-09 (×8): qty 1

## 2012-11-09 MED ORDER — FUROSEMIDE 10 MG/ML IJ SOLN
40.0000 mg | Freq: Once | INTRAMUSCULAR | Status: AC
  Administered 2012-11-09: 40 mg via INTRAVENOUS
  Filled 2012-11-09: qty 4

## 2012-11-09 NOTE — Evaluation (Signed)
Occupational Therapy Evaluation Patient Details Name: Andrea Yu MRN: [id number] DOB: 10-24-1940 Today's Date: 11/09/2012 Time: 4098-1191 OT Time Calculation (min): 19 min  OT Assessment / Plan / Recommendation History of present illness Pt admitted from home w/ dx: Severe sepsis with lactic acidosis, iron deficiency anemia w/ ?GI bleed, Atrial fibrillation, hypotension and psoriatic arthritis.   Clinical Impression   Pt presents with deficits in ADL's and functional transfers (see OT problem list below) secondary to dx as per above and will benefit from acute OT to assist with increasing independence. Monitor HR.   OT Assessment  Patient needs continued OT Services    Follow Up Recommendations  Home health OT;Other (comment) (vs SNF if family unable to assist)    Barriers to Discharge      Equipment Recommendations  Tub/shower seat    Recommendations for Other Services    Frequency  Min 2X/week    Precautions / Restrictions Precautions Precautions: Fall;Other (comment) (DNR) Restrictions Weight Bearing Restrictions: No   Pertinent Vitals/Pain No, denies pain. "I had a headache earlier, but I don't anymore, I took tylenol"    ADL  Grooming: Performed;Wash/dry hands;Set up Where Assessed - Grooming: Supported sitting Upper Body Bathing: Simulated;Set up;Minimal assistance Where Assessed - Upper Body Bathing: Supported sitting Lower Body Bathing: Simulated;Minimal assistance Where Assessed - Lower Body Bathing: Supported sitting;Supported sit to stand Upper Body Dressing: Performed;Set up Where Assessed - Upper Body Dressing: Supported sitting Lower Body Dressing: Performed;Supervision/safety;Set up Where Assessed - Lower Body Dressing: Unsupported sitting (Pt sitting EOB & brings one leg at a time onto bed w/ knee flexed) Toilet Transfer: Performed;Moderate assistance Toilet Transfer Method: Stand pivot Toilet Transfer Equipment: Bedside commode Toileting - Clothing  Manipulation and Hygiene: Performed;Minimal assistance Where Assessed - Engineer, mining and Hygiene: Sit to stand from 3-in-1 or toilet;Standing Tub/Shower Transfer Method: Not assessed Equipment Used: Other (comment) (3:1, hand held assist. Pt uses RW at home) Transfers/Ambulation Related to ADLs: Pt was Mod assist for sit to stand from EOB and stand pivot transfer to 3:1 w/ hand held assist. VC's for safety. Pt has own way of moving and for ADL's ADL Comments: Pt was educated in Role of OT and then participated in ADL's related to grooming, and toileting/functional transfers. After toileting, transport came into room and RN notified Pt & OT that was going for LE dopplers.      OT Diagnosis: Generalized weakness  OT Problem List: Decreased strength;Decreased activity tolerance;Impaired balance (sitting and/or standing);Decreased knowledge of use of DME or AE;Cardiopulmonary status limiting activity;Decreased knowledge of precautions;Impaired UE functional use OT Treatment Interventions: Self-care/ADL training;Therapeutic exercise;Energy conservation;DME and/or AE instruction;Therapeutic activities;Patient/family education;Balance training   OT Goals(Current goals can be found in the care plan section) Acute Rehab OT Goals Patient Stated Goal: To get well and return home as able Time For Goal Achievement: 11/16/12 Potential to Achieve Goals: Good  Visit Information  Last OT Received On: 11/09/12 Assistance Needed: +1 History of Present Illness: Pt admitted from home w/ dx: Severe sepsis with lactic acidosis, iron deficiency anemia w/ ?GI bleed, Atrial fibrillation, hypotension and psoriatic arthritis.       Prior Functioning     Home Living Family/patient expects to be discharged to:: Private residence Living Arrangements: Spouse/significant other Available Help at Discharge: Family Type of Home: House Home Access: Ramped entrance Home Layout: One level Home  Equipment: Environmental consultant - 2 wheels;Cane - single point;Wheelchair - IT trainer;Bedside commode Prior Function Level of Independence: Needs assistance Gait / Transfers  Assistance Needed: Uses RW for all activity at home ADL's / Homemaking Assistance Needed: Assist w/ dressing if arthritis is "acting up" Communication / Swallowing Assistance Needed: WNL's Communication Communication: No difficulties Dominant Hand: Left    Vision/Perception Vision - History Baseline Vision: Wears glasses only for reading Patient Visual Report: No change from baseline   Cognition  Cognition Arousal/Alertness: Awake/alert Behavior During Therapy: WFL for tasks assessed/performed Overall Cognitive Status: Within Functional Limits for tasks assessed    Extremity/Trunk Assessment Upper Extremity Assessment Upper Extremity Assessment: Generalized weakness Lower Extremity Assessment Lower Extremity Assessment: Defer to PT evaluation    Mobility Bed Mobility Bed Mobility: Supine to Sit;Sitting - Scoot to Edge of Bed Supine to Sit: 5: Supervision;HOB elevated Sitting - Scoot to Edge of Bed: 6: Modified independent (Device/Increase time);With rail Details for Bed Mobility Assistance: VC's for safety.  Transfers Transfers: Sit to Stand;Stand to Sit Sit to Stand: 3: Mod assist;With upper extremity assist;With armrests;From bed;From chair/3-in-1 Stand to Sit: 4: Min assist;With upper extremity assist;With armrests;To chair/3-in-1;Other (comment) (To w/c for testing) Details for Transfer Assistance: VC's for safety and sequencing. Pt reaches for objects in room & not always safe (ie. bedside table, chair, 3:1). Pt will benefit from RW [place].        Balance Balance Balance Assessed: Yes Static Sitting Balance Static Sitting - Balance Support: No upper extremity supported;Feet unsupported Static Standing Balance Static Standing - Balance Support: Bilateral upper extremity supported;During functional  activity   End of Session OT - End of Session Equipment Utilized During Treatment: Gait belt;Other (comment) (3:1 & hand held assist) Activity Tolerance: Patient limited by fatigue;Other (comment) (Pt going off floor for testing) Patient left: Other (comment) (Assisted to w/c for transport to take for testing off floor) Nurse Communication: Mobility status  GO     Roselie Awkward Dixon 11/09/2012, 10:00 AM

## 2012-11-09 NOTE — Evaluation (Signed)
Physical Therapy Evaluation Patient Details Name: Andrea Yu MRN: [id number] DOB: Mar 05, 1940 Today's Date: 11/09/2012 Time: 1130-1150 PT Time Calculation (min): 20 min  PT Assessment / Plan / Recommendation History of Present Illness   from home w/ dx: Severe sepsis with lactic acidosis, iron deficiency anemia w/ ?GI bleed, Atrial fibrillation, hypotension and psoriatic arthritis.  Clinical Impression  Pt is generally deconditioned with limited activity tolerance.  Per pt husband, this is her baseline.  Despite being an excellent candidate for [facility] therapies the pt is politely declining at this time.   PT to follow acutely for deficits listed below.       PT Assessment  Patient needs continued PT services    Follow Up Recommendations  Home health PT;Supervision for mobility/OOB (pt politely declining HHPT f/u at discharge)    Does the patient have the potential to tolerate intense rehabilitation     NA  Barriers to Discharge   None      Equipment Recommendations  None recommended by PT    Recommendations for Other Services   None  Frequency Min 3X/week    Precautions / Restrictions Precautions Precautions: Fall Restrictions Weight Bearing Restrictions: No   Pertinent Vitals/Pain DOE 3/4 with short distance gait, HR 90 bpm, O2 sats 97% on RA.        Mobility  Bed Mobility Bed Mobility: Not assessed (pt OOB in chair) Supine to Sit: 5: Supervision;HOB elevated Sitting - Scoot to Edge of Bed: 6: Modified independent (Device/Increase time);With rail Details for Bed Mobility Assistance: VC's for safety.  Transfers Transfers: Stand to Sit;Sit to Stand Sit to Stand: 4: Min assist;With upper extremity assist;With armrests;From chair/3-in-1;From toilet;5: Supervision Stand to Sit: 5: Supervision;With upper extremity assist;With armrests;To chair/3-in-1;To toilet Details for Transfer Assistance: Min assist to stand from lower toilet, supervision from higher recliner chair  with heavy dependence on arms for support during transitions and uncontrolled descent to sit.  Verbal cues for safe hand placement.   Ambulation/Gait Ambulation/Gait Assistance: 4: Min guard Ambulation Distance (Feet): 20 Feet Assistive device: Rolling walker Ambulation/Gait Assistance Details: min assist to help steady pt for balance and safety during gait.  Pt with increased DOE even with short distance gait to the bathroom and back.  Heavy reliance on hands for support and flexed trunk posture.   Gait Pattern: Step-through pattern;Shuffle;Trunk flexed Gait velocity: decreased        PT Diagnosis: Difficulty walking;Abnormality of gait;Generalized weakness  PT Problem List: Decreased strength;Decreased activity tolerance;Decreased balance;Decreased mobility;Cardiopulmonary status limiting activity;Obesity PT Treatment Interventions: DME instruction;Gait training;Functional mobility training;Therapeutic activities;Therapeutic exercise;Balance training;Neuromuscular re-education;Patient/family education     PT Goals(Current goals can be found in the care plan section) Acute Rehab PT Goals Patient Stated Goal: To get well and return home as able PT Goal Formulation: With patient/family Time For Goal Achievement: 11/23/12 Potential to Achieve Goals: Good  Visit Information  Last PT Received On: 11/09/12 Assistance Needed: +1 History of Present Illness:  from home w/ dx: Severe sepsis with lactic acidosis, iron deficiency anemia w/ ?GI bleed, Atrial fibrillation, hypotension and psoriatic arthritis.       Prior Functioning  Home Living Family/patient expects to be discharged to:: Private residence Living Arrangements: Spouse/significant other Available Help at Discharge: Family Type of Home: House Home Access: Ramped entrance Home Layout: One level Home Equipment: Environmental consultant - 2 wheels;Cane - single point;Wheelchair - IT trainer;Bedside commode Additional Comments: per pt  she uses RW at home Prior Function Level of Independence: Needs assistance Gait /  Transfers Assistance Needed: Uses RW for all activity at home ADL's / Homemaking Assistance Needed: Assist w/ dressing if arthritis is "acting up" Communication / Swallowing Assistance Needed: WNL's Comments: does not drive, plays the piano at church Communication Communication: No difficulties Dominant Hand: Left    Cognition  Cognition Arousal/Alertness: Awake/alert Behavior During Therapy: WFL for tasks assessed/performed Overall Cognitive Status: Within Functional Limits for tasks assessed    Extremity/Trunk Assessment Upper Extremity Assessment Upper Extremity Assessment: Defer to OT evaluation (bil shoulder arthritis, cannot raise to 90 degress flex bil) Lower Extremity Assessment Lower Extremity Assessment: Overall WFL for tasks assessed (h/o peripherial neuropathy bil feet) Cervical / Trunk Assessment Cervical / Trunk Assessment: Other exceptions Cervical / Trunk Exceptions: pr reports h/o "rod in my back"   Balance Balance Balance Assessed: Yes Static Sitting Balance Static Sitting - Balance Support: No upper extremity supported;Feet unsupported Static Standing Balance Static Standing - Balance Support: Bilateral upper extremity supported Static Standing - Level of Assistance: 5: Stand by assistance Dynamic Standing Balance Dynamic Standing - Balance Support: Bilateral upper extremity supported Dynamic Standing - Level of Assistance: 4: Min assist  End of Session PT - End of Session Activity Tolerance: Patient limited by fatigue;Other (comment) (limited by DOE) Patient left: in chair;with call bell/phone within reach;with family/visitor present Nurse Communication: Mobility status    Andrea Yu, PT, DPT [phone]   11/09/2012, 12:01 PM

## 2012-11-09 NOTE — Progress Notes (Signed)
[facility] Progress Note [place] TEAM 1 - Stepdown/ICU TEAM   ARLYN BUMPUS AVW:[id number] DOB: 08-14-1940 DOA: 11/06/2012 PCP: Garlan Fillers, MD  Admit HPI / Brief Narrative: 60 F with hx of AF, HTN, psoriatic arthritis presented to [facility] ED 10/13 with fever and weakness x 7 - 10 days.  PT was noted to be febrile and hypotensive in the ED despite NS boluses. [facility] initially evaluated the pt but hypotension persisted so she was admitted to ICU via PCCM.  Assessment/Plan:  SIRS (HR 107, WBC 18.9) w/ Shock with lactic acidosis secondary to unknown source Consider septic, hypovolemic, adrenal insuff, cardiogenic in setting A fib - SIRS physiology has now resolved- clear etiology still not found, but could be related to apparent R LE cellulitis   R LE cellulitis  Pt has wound on R shin - R leg is warmer than L on touch, and is erythematous - WBC trending down - pt is not febrile - cont current abx and follow exam -  doppler negative for DVT - of note MRSA screen was negative - cont Levaquin  Iron deficiency anemia Pt has received an IV Fe load - GI will do endoscopy in AM  B12 deficiency  B12 was 219 - replace with SQ load and f/u in 6-8 weeks as outpt   Questionable chronic GI bleed - had an outpt appt with Dr Loreta Ave for anemia - Hgb baseline 8 but dropped down to 6- transfused total of 3 U PRBC   - FOB negative - Endoscopy in AM by Dr Elnoria Howard    Chronic Parox Atrial fibrillation w RVR As per Cardiology - agree pt is high risk for embolic complications  - will plan to resume anticoag based on EGD results  Adrenal insufficiency On chronic steroids  Psoriatic arthritis Resuming home medical tx  Depression  Hypertension BP currently well controlled   Code Status: NO CODE Family Communication: no family present at time of exam Disposition Plan: tele bed - follow CBC - possible D/C next 24-48hrs depending on stability of HgB - PT/OT to see  Consultants: PCCM >>  [facility] Cardiology GI  Procedures: TTE - Normal EF and wall motion - Pa pressure:  Antibiotics: Aztreonam 10/13 >>>10/13  Levofloxacin 10/13 >>  Vanc 10/13 >> 10/14   DVT prophylaxis: SCDs  HPI/Subjective: Patient is up in a chair. She has no complaints. Right leg is without pain.   Objective: Blood pressure 110/74, pulse 90, temperature 98.3 F (36.8 C), temperature source Oral, resp. rate 20, height 4\' 11"  (1.499 m), weight 90.9 kg (200 lb 6.4 oz), SpO2 97.00%.  Intake/Output Summary (Last 24 hours) at 11/09/12 1757 Last data filed at 11/08/12 2250  Gross per 24 hour  Intake    360 ml  Output    550 ml  Net   -190 ml   Exam: General: No acute respiratory distress at rest Lungs:  Faint bibasilar crackles but no wheeze with good air movement throughout other fields Cardiovascular: Regular rate and rhythm without murmur gallop or rub normal S1 and S2 Abdomen: Nontender, nondistended, soft, bowel sounds positive, no rebound, no ascites, no appreciable mass Extremities: No significant cyanosis, clubbing-- the right lower extremity is mildly erythematous and warm to touch compared to the left - the patient has an approximately 1 inch wound on the anterior aspect of the right shin with no obvious purulent drainage but with surrounding erythema and edema- mild edema of left leg  Data Reviewed: Basic Metabolic Panel:  Recent Labs Lab 11/06/12 1015 11/07/12 0334  NA 138 140  K 3.7 3.9  CL 101 108  CO2 26 21  GLUCOSE 87 105*  BUN 15 13  CREATININE 0.81 0.70  CALCIUM 8.2* 7.3*  MG  --  2.0   Liver Function Tests:  Recent Labs Lab 11/06/12 1015 11/07/12 0334  AST 14 21  ALT 11 13  ALKPHOS 66 61  BILITOT 0.2* 0.3  PROT 5.1* 4.6*  ALBUMIN 2.5* 2.1*   CBC:  Recent Labs Lab 11/06/12 1015 11/06/12 2225 11/07/12 0334 11/07/12 0910 11/07/12 2026 11/08/12 0120 11/08/12 0920 11/09/12 0430  WBC 18.9* 42.3* 37.7*  --   --  31.6*  --  17.3*  NEUTROABS 17.6*   --   --   --   --   --   --   --   HGB 6.3* 7.2*  7.2* 7.1* 6.7* 9.4* 9.3* 9.5* 10.0*  HCT 22.7* 25.0*  24.2* 23.7* 22.3* 29.2* 28.8* 30.1* 31.5*  MCV 76.7* 78.1 77.2*  --   --  78.7  --  78.9  PLT 400 341 334  --   --  244  --  313   CBG:  Recent Labs Lab 11/08/12 0816 11/08/12 1641 11/08/12 2352 11/09/12 0717 11/09/12 1123  GLUCAP 99 100* 87 72 88    Recent Results (from the past 240 hour(s))  CULTURE, BLOOD (ROUTINE X 2)     Status: None   Collection Time    11/06/12 10:15 AM      Result Value Range Status   Specimen Description BLOOD RIGHT ARM   Final   Special Requests     Final   Value: BOTTLES DRAWN AEROBIC AND ANAEROBIC BLUE 10CC,RED 5CC   Culture  Setup Time     Final   Value: 11/06/2012 14:02     Performed at [facility]   Culture     Final   Value:        BLOOD CULTURE RECEIVED NO GROWTH TO DATE CULTURE WILL BE HELD FOR 5 DAYS BEFORE ISSUING A FINAL NEGATIVE REPORT     Performed at [facility]   Report Status PENDING   Incomplete  CULTURE, BLOOD (ROUTINE X 2)     Status: None   Collection Time    11/06/12 10:20 AM      Result Value Range Status   Specimen Description BLOOD RIGHT HAND   Final   Special Requests BOTTLES DRAWN AEROBIC ONLY 5CC   Final   Culture  Setup Time     Final   Value: 11/06/2012 14:02     Performed at [facility]   Culture     Final   Value:        BLOOD CULTURE RECEIVED NO GROWTH TO DATE CULTURE WILL BE HELD FOR 5 DAYS BEFORE ISSUING A FINAL NEGATIVE REPORT     Performed at [facility]   Report Status PENDING   Incomplete  URINE CULTURE     Status: None   Collection Time    11/06/12 12:55 PM      Result Value Range Status   Specimen Description URINE, CATHETERIZED   Final   Special Requests NONE   Final   Culture  Setup Time     Final   Value: 11/06/2012 16:33     Performed at [facility] Count     Final   Value: NO GROWTH     Performed at  [facility]      Final   Value: NO GROWTH     Performed at [facility]   Report Status 11/07/2012 FINAL   Final  MRSA PCR SCREENING     Status: None   Collection Time    11/06/12  4:40 PM      Result Value Range Status   MRSA by PCR NEGATIVE  NEGATIVE Final   Comment:            The GeneXpert MRSA Assay (FDA     approved for NASAL specimens     only), is one component of a     comprehensive MRSA colonization     surveillance program. It is not     intended to diagnose MRSA     infection nor to guide or     monitor treatment for     MRSA infections.     Studies:  Recent x-ray studies have been reviewed in detail by the Attending Physician  Scheduled Meds:  Scheduled Meds: . [START ON 11/10/2012] apixaban  5 mg Oral BID  . aspirin EC  81 mg Oral QHS  . cyanocobalamin  1,000 mcg Subcutaneous Q1500  . diltiazem  60 mg Oral Q6H  . DULoxetine  90 mg Oral Daily  . [START ON 11/14/2012] etanercept  50 mg Subcutaneous Q Tue  . ferrous sulfate  325 mg Oral BID WC  . [START ON 11/10/2012] furosemide  40 mg Oral Daily  . gabapentin  600 mg Oral BID  . levalbuterol  0.63 mg Nebulization TID  . levofloxacin (LEVAQUIN) IV  750 mg Intravenous Q24H  . magnesium oxide  200 mg Oral Daily  . metoprolol tartrate  25 mg Oral BID  . multivitamin with minerals  1 tablet Oral Daily  . pantoprazole  40 mg Oral Daily  . predniSONE  10 mg Oral Q breakfast  . sodium chloride  3 mL Intravenous Q12H  . Vitamin D (Ergocalciferol)  50,000 Units Oral Q7 days    Time spent on care of this patient: 35 mins   Calvert Cantor, MD  [facility] Office  [phone] Pager - Text Page per Amion as per below:  On-Call/Text Page:      [email or web address]      password [email or web address]  If 7PM-7AM, please contact night-coverage [email or web address] Password [email or web address] 11/09/2012, 5:57 PM   LOS: 3 days

## 2012-11-09 NOTE — Progress Notes (Signed)
Subjective: No complaints, not aware of tachycardia.  Occ SOB but nebs help  Objective: Vital signs in last 24 hours: Temp:  [98.1 F (36.7 C)-98.8 F (37.1 C)] 98.8 F (37.1 C) (10/16 0417) Pulse Rate:  [79-90] 87 (10/16 0417) Resp:  [18] 18 (10/16 0417) BP: (122-153)/(64-78) 153/71 mmHg (10/16 0417) SpO2:  [94 %-97 %] 94 % (10/16 0417) Weight change:  Last BM Date: 11/08/12 Intake/Output from previous day: -190 (+4745 since admit)  Wt up from 184 to 200 though with change in scales. 10/15 0701 - 10/16 0700 In: 360 [P.O.:360] Out: 550 [Urine:550] Intake/Output this shift:    PE: General:Pleasant affect, NAD Skin:Warm and dry, brisk capillary refill HEENT:normocephalic, sclera clear, mucus membranes moist Neck:supple, no JVD, no bruits  Heart:irreg irreg without murmur, gallup, rub or click Lungs:decreased in bases without rales, occ rhonchi, occ wheezes MWU:XLKGM,WNUU, non tender, + BS, do not palpate liver spleen or masses Ext:no lower ext edema, 2+ pedal pulses, 2+ radial pulses Neuro:alert and oriented, MAE, follows commands, + facial symmetry   Lab Results:  Recent Labs  11/08/12 0120 11/08/12 0920 11/09/12 0430  WBC 31.6*  --  17.3*  HGB 9.3* 9.5* 10.0*  HCT 28.8* 30.1* 31.5*  PLT 244  --  313   BMET  Recent Labs  11/06/12 1015 11/07/12 0334  NA 138 140  K 3.7 3.9  CL 101 108  CO2 26 21  GLUCOSE 87 105*  BUN 15 13  CREATININE 0.81 0.70  CALCIUM 8.2* 7.3*    Recent Labs  11/06/12 1604  TROPONINI <0.30    No results found for this basename: CHOL, HDL, LDLCALC, LDLDIRECT, TRIG, CHOLHDL   No results found for this basename: HGBA1C     No results found for this basename: TSH    Hepatic Function Panel  Recent Labs  11/07/12 0334  PROT 4.6*  ALBUMIN 2.1*  AST 21  ALT 13  ALKPHOS 61  BILITOT 0.3     Studies/Results: 2D Echo: Study Conclusions  - Left ventricle: The cavity size was normal. Systolic function was normal.  Wall motion was normal; there were no regional wall motion abnormalities. Left ventricular diastolic function parameters were normal. - Aortic valve: Trivial regurgitation. - Left atrium: The atrium was mildly dilated. - Atrial septum: No defect or patent foramen ovale was identified. - Pulmonary arteries: Systolic pressure was mildly increased. PA peak pressure: 37mm Hg (S).  Medications: I have reviewed the patient's current medications. Scheduled Meds: . aspirin EC  81 mg Oral QHS  . cyanocobalamin  1,000 mcg Subcutaneous Q1500  . diltiazem  180 mg Oral QHS  . DULoxetine  90 mg Oral Daily  . [START ON 11/14/2012] etanercept  50 mg Subcutaneous Q Tue  . ferrous sulfate  325 mg Oral BID WC  . gabapentin  600 mg Oral BID  . levalbuterol  0.63 mg Nebulization TID  . levofloxacin (LEVAQUIN) IV  750 mg Intravenous Q24H  . magnesium oxide  200 mg Oral Daily  . metoprolol tartrate  25 mg Oral BID  . multivitamin with minerals  1 tablet Oral Daily  . pantoprazole  40 mg Oral Daily  . predniSONE  10 mg Oral Q breakfast  . sodium chloride  3 mL Intravenous Q12H  . Vitamin D (Ergocalciferol)  50,000 Units Oral Q7 days   Continuous Infusions: . sodium chloride 100 mL/hr at 11/08/12 0521   PRN Meds:.acetaminophen  Assessment/Plan: Principal Problem:   Shock circulatory Active  Problems:   Adrenal insufficiency   Atrial fibrillation   Hypotension   Dehydration   Psoriatic arthritis   Iron deficiency anemia   Depression   Peripheral neuropathy   Systemic inflammatory response syndrome (SIRS)  PLAN:PAF with increased HR, off Eliquis due to acute anemia, prob Iron def.   Will increase cardizem to 60 mg  Every 6 hours and hold long acting until HR controlled.   LOS: 3 days   Time spent with pt. :15 minutes. [facility]  Nurse Practitioner Certified Pager [phone] 11/09/2012, 8:08 AM  I have seen and evaluated the patient this AM along with Nada Boozer, NP. I agree with her  findings, examination as well as impression recommendations.  LE edema seems more apparent & also notes orthopnea -- was on fuorsemide @ home - would restart & give IV dose today. Unsure of source of initial shock - likely septic.  -- continue Abx ? Cellulitis, see Dr. Erin Hearing note from yesterday.  With frequent, intermittent Afib & borderline rate control - agree with additional CCB coverage.  Unless acute GIBleed noted, I would restart Eliquis -- pending eval by Dr. Loreta Ave - can simply stop 24hrs pre-EGD/Colon.  H/H stable   MD Time with pt: 15 min  HARDING,DAVID W, M.D., M.S. [facility] [address] [place], [place]  [address]  [phone] Pager # [phone] 11/09/2012 11:56 AM

## 2012-11-09 NOTE — Progress Notes (Signed)
Subjective: No acute events.  Feeling well.  Objective: Vital signs in last 24 hours: Temp:  [98.1 F (36.7 C)-98.8 F (37.1 C)] 98.3 F (36.8 C) (10/16 1122) Pulse Rate:  [72-90] 90 (10/16 1152) Resp:  [18-20] 20 (10/16 1122) BP: (122-153)/(55-78) 122/55 mmHg (10/16 1122) SpO2:  [93 %-97 %] 97 % (10/16 1152) Last BM Date: 11/08/12  Intake/Output from previous day: 10/15 0701 - 10/16 0700 In: 360 [P.O.:360] Out: 550 [Urine:550] Intake/Output this shift:    General appearance: alert and no distress GI: soft, non-tender; bowel sounds normal; no masses,  no organomegaly  Lab Results:  Recent Labs  11/07/12 0334  11/08/12 0120 11/08/12 0920 11/09/12 0430  WBC 37.7*  --  31.6*  --  17.3*  HGB 7.1*  < > 9.3* 9.5* 10.0*  HCT 23.7*  < > 28.8* 30.1* 31.5*  PLT 334  --  244  --  313  < > = values in this interval not displayed. BMET  Recent Labs  11/07/12 0334  NA 140  K 3.9  CL 108  CO2 21  GLUCOSE 105*  BUN 13  CREATININE 0.70  CALCIUM 7.3*   LFT  Recent Labs  11/07/12 0334  PROT 4.6*  ALBUMIN 2.1*  AST 21  ALT 13  ALKPHOS 61  BILITOT 0.3   PT/INR No results found for this basename: LABPROT, INR,  in the last 72 hours Hepatitis Panel No results found for this basename: HEPBSAG, HCVAB, HEPAIGM, HEPBIGM,  in the last 72 hours C-Diff No results found for this basename: CDIFFTOX,  in the last 72 hours Fecal Lactopherrin No results found for this basename: FECLLACTOFRN,  in the last 72 hours  Studies/Results: No results found.  Medications:  Scheduled: . apixaban  5 mg Oral BID  . aspirin EC  81 mg Oral QHS  . cyanocobalamin  1,000 mcg Subcutaneous Q1500  . diltiazem  60 mg Oral Q6H  . DULoxetine  90 mg Oral Daily  . [START ON 11/14/2012] etanercept  50 mg Subcutaneous Q Tue  . ferrous sulfate  325 mg Oral BID WC  . [START ON 11/10/2012] furosemide  40 mg Oral Daily  . gabapentin  600 mg Oral BID  . levalbuterol  0.63 mg Nebulization TID  .  levofloxacin (LEVAQUIN) IV  750 mg Intravenous Q24H  . magnesium oxide  200 mg Oral Daily  . metoprolol tartrate  25 mg Oral BID  . multivitamin with minerals  1 tablet Oral Daily  . pantoprazole  40 mg Oral Daily  . polyethylene glycol-electrolytes  4,000 mL Oral Once  . predniSONE  10 mg Oral Q breakfast  . sodium chloride  3 mL Intravenous Q12H  . Vitamin D (Ergocalciferol)  50,000 Units Oral Q7 days   Continuous: . sodium chloride 100 mL/hr at 11/08/12 0521  . sodium chloride      Assessment/Plan: 1) Anemia. 2) Celluitis. 3) Sepsis - resolved.   The patient is stable.  She will undergo an EGD/Colonoscopy tomorrow AM.  Plan: 1) EGD/Colonoscopy tomorrow.   LOS: 3 days   Andrea Yu D 11/09/2012, 12:55 PM

## 2012-11-09 NOTE — Progress Notes (Signed)
*  Preliminary Results* Right lower extremity venous duplex completed. Right lower extremity is negative for deep vein thrombosis. There is no evidence of right Baker's cyst.  11/09/2012 10:10 AM  Gertie Fey, RVT, RDCS, RDMS

## 2012-11-09 NOTE — Progress Notes (Signed)
Hr as high as 150s non-sustaining upon ambulating to the [facility]. Pt asymptomatic. Will continue to monitor the pt. Sanda Linger, RN

## 2012-11-10 ENCOUNTER — Encounter (HOSPITAL_COMMUNITY)
Admission: EM | Disposition: A | Discharge status: Home / Self Care | Payer: Self-pay | Source: Home / Self Care | Attending: Internal Medicine

## 2012-11-10 ENCOUNTER — Encounter (HOSPITAL_COMMUNITY): Payer: Self-pay | Admitting: *Deleted

## 2012-11-10 DIAGNOSIS — E669 Obesity, unspecified: Secondary | ICD-10-CM | POA: Diagnosis present

## 2012-11-10 DIAGNOSIS — Z7901 Long term (current) use of anticoagulants: Secondary | ICD-10-CM

## 2012-11-10 HISTORY — PX: COLONOSCOPY: SHX5424

## 2012-11-10 HISTORY — PX: ESOPHAGOGASTRODUODENOSCOPY: SHX5428

## 2012-11-10 LAB — GLUCOSE, CAPILLARY
Glucose-Capillary: 63 mg/dL — ABNORMAL LOW (ref 70–99)
Glucose-Capillary: 90 mg/dL (ref 70–99)
Glucose-Capillary: 77 mg/dL (ref 70–99)
Glucose-Capillary: 103 mg/dL — ABNORMAL HIGH (ref 70–99)

## 2012-11-10 SURGERY — COLONOSCOPY
Anesthesia: Moderate Sedation

## 2012-11-10 SURGERY — EGD (ESOPHAGOGASTRODUODENOSCOPY)
Anesthesia: Moderate Sedation

## 2012-11-10 MED ORDER — FENTANYL CITRATE 0.05 MG/ML IJ SOLN
INTRAMUSCULAR | Status: AC
  Filled 2012-11-10: qty 2

## 2012-11-10 MED ORDER — FENTANYL CITRATE 0.05 MG/ML IJ SOLN
INTRAMUSCULAR | Status: DC | PRN
  Administered 2012-11-10 (×3): 25 ug via INTRAVENOUS

## 2012-11-10 MED ORDER — LEVALBUTEROL HCL 0.63 MG/3ML IN NEBU: 0.6300 mg | INHALATION_SOLUTION | Freq: Four times a day (QID) | RESPIRATORY_TRACT | Status: DC | PRN

## 2012-11-10 MED ORDER — DILTIAZEM HCL ER COATED BEADS 120 MG PO TB24
240.0000 mg | ORAL_TABLET | Freq: Every day | ORAL | Status: DC
  Administered 2012-11-11: 240 mg via ORAL
  Filled 2012-11-10: qty 2

## 2012-11-10 MED ORDER — MIDAZOLAM HCL 5 MG/ML IJ SOLN
INTRAMUSCULAR | Status: AC
  Filled 2012-11-10: qty 2

## 2012-11-10 MED ORDER — DEXTROSE 50 % IV SOLN
INTRAVENOUS | Status: AC
  Administered 2012-11-10: 25 mL via INTRAVENOUS
  Filled 2012-11-10: qty 50

## 2012-11-10 MED ORDER — MIDAZOLAM HCL 10 MG/2ML IJ SOLN
INTRAMUSCULAR | Status: DC | PRN
  Administered 2012-11-10: 1 mg via INTRAVENOUS
  Administered 2012-11-10 (×2): 2 mg via INTRAVENOUS
  Administered 2012-11-10: 1 mg via INTRAVENOUS

## 2012-11-10 MED ORDER — DEXTROSE 50 % IV SOLN
25.0000 mL | Freq: Once | INTRAVENOUS | Status: AC | PRN
  Administered 2012-11-10: 25 mL via INTRAVENOUS

## 2012-11-10 MED ORDER — BUTAMBEN-TETRACAINE-BENZOCAINE 2-2-14 % EX AERO
INHALATION_SPRAY | CUTANEOUS | Status: DC | PRN
  Administered 2012-11-10: 1 via TOPICAL

## 2012-11-10 NOTE — H&P (View-Only) (Signed)
Patient cbg 63, pt assymptomanic, pt npo, given 25mg  d5 IV, rechecked pt cbg and was 90. Notified dr. Lendell Caprice, no new orders received at this time, will continue to monitor.

## 2012-11-10 NOTE — Op Note (Signed)
[name] [address] [place], [address]   OPERATIVE PROCEDURE REPORT  PATIENT: Andrea Yu, Andrea Yu  MR#: [id number] BIRTHDATE: 03-21-1940  GENDER: Female ENDOSCOPIST: Jeani Hawking, MD ASSISTANT:   Karie Soda, technician and Jimmey Ralph, RN, CGRN PROCEDURE DATE: 11/10/2012 PROCEDURE:   EGD w/ biopsy ASA CLASS:   Class III INDICATIONS:Anemia. MEDICATIONS: Versed 5 mg IV and Fentanyl 50 mcg IV TOPICAL ANESTHETIC:   none  DESCRIPTION OF PROCEDURE:   After the risks benefits and alternatives of the procedure were thoroughly explained, informed consent was obtained.  The Pentax Gastroscope [id number]  endoscope was introduced through the mouth  and advanced to the second portion of the duodenum Without limitations.      The instrument was slowly withdrawn as the mucosa was fully examined.      FINDINGS: In the proximal gastric body a friable section of mucosa was noted.  It oozed with minor trauma.  No evidence of any ulcerations, erosions, polyps, masses, or vascular abnormalities. Cold biopsies were obtained of this friable area.          The scope was then withdrawn from the patient and the procedure terminated.  COMPLICATIONS: There were no complications. IMPRESSION: 1) Proximal gastric body gastritis.  RECOMMENDATIONS: 1) Await biopsy results. 2) Proceed with the colonoscopy.  _______________________________ eSignedJeani Hawking, MD 11/10/2012 4:32 PM

## 2012-11-10 NOTE — Progress Notes (Signed)
Subjective:  No SOB, flat in bed  Objective:  Vital Signs in the last 24 hours: Temp:  [97.7 F (36.5 C)-98.3 F (36.8 C)] 98.1 F (36.7 C) (10/17 0401) Pulse Rate:  [72-90] 89 (10/17 0401) Resp:  [18-20] 18 (10/17 0401) BP: (110-158)/(55-83) 139/65 mmHg (10/17 0401) SpO2:  [93 %-99 %] 97 % (10/17 0725) Weight:  [192 lb 10.9 oz (87.4 kg)] 192 lb 10.9 oz (87.4 kg) (10/17 0401)  Intake/Output from previous day:  Intake/Output Summary (Last 24 hours) at 11/10/12 0957 Last data filed at 11/10/12 0900  Gross per 24 hour  Intake   6792 ml  Output      0 ml  Net   6792 ml    Physical Exam: General appearance: alert, cooperative, no distress and morbidly obese Lungs: decreased breath sounds Heart: irregularly irregular rhythm   Rate: 80  Rhythm: atrial fibrillation  Lab Results:  Recent Labs  11/08/12 0120 11/08/12 0920 11/09/12 0430  WBC 31.6*  --  17.3*  HGB 9.3* 9.5* 10.0*  PLT 244  --  313   No results found for this basename: NA, K, CL, CO2, GLUCOSE, BUN, CREATININE,  in the last 72 hours No results found for this basename: TROPONINI, CK, MB,  in the last 72 hours No results found for this basename: INR,  in the last 72 hours  Imaging: Imaging results have been reviewed  Cardiac Studies:  Assessment/Plan:   Principal Problem:   Shock circulatory on admission 11/06/12 Active Problems:   Iron deficiency anemia- transfused this admission   Systemic inflammatory response syndrome (SIRS)   PAF - recurrent this admission   Chronic anticoagulation- Eloquis   Psoriatic arthritis   Depression   Peripheral neuropathy   Obesity (BMI 30-39.9)- suspected sleep apnea, declines sleep study  PLAN: EGD today. Eloquis to be resumed post EGD, will continue unless EGD shows that she would be at increased risk. Change Diltiazem to CD 240 mg daily in am. The pt declines colonoscopy at this time.   Corine Shelter PA-C Beeper [phone] 11/10/2012, 9:57 AM  I have seen and  evaluated the patient this AM along with Corine Shelter, PA. I agree with his findings, examination as well as impression recommendations.  Remains stable with rate controlled Afib - agree with converting to 1 x daily Diltiazem. It would appear that GI is indeed going to proceed with EGD/Colon today (delayed b/c she ate breakfast) -- hold off on Eliquis until we see results. I/O not recorded accurately, but weight did go down after diuretic.  Will give additional IV dose today & home po dose restarted.  No longer febrile,  & WBC is trending down (? Prednisone effect).  Still on IV ABx per [facility].  With current rate control, she remains stable hemodynamically.  No other active cardiac issues.    MD Time with pt: 15 min  HARDING,DAVID W, M.D., M.S. [facility] [address] [place], [place]  [address]  [phone] Pager # [phone] 11/10/2012 10:13 AM

## 2012-11-10 NOTE — Interval H&P Note (Signed)
History and Physical Interval Note:  11/10/2012 3:36 PM  Andrea Yu  has presented today for surgery, with the diagnosis of Anemia  The various methods of treatment have been discussed with the patient and family. After consideration of risks, benefits and other options for treatment, the patient has consented to  Procedure(s): COLONOSCOPY (N/A) ESOPHAGOGASTRODUODENOSCOPY (EGD) (N/A) as a surgical intervention .  The patient's history has been reviewed, patient examined, no change in status, stable for surgery.  I have reviewed the patient's chart and labs.  Questions were answered to the patient's satisfaction.     Lakaya Tolen D

## 2012-11-10 NOTE — Progress Notes (Signed)
PT Cancellation Note  Patient Details Name: Andrea Yu MRN: [id number] DOB: 02-03-1940   Cancelled Treatment:    Reason Eval/Treat Not Completed: Patient at procedure or test/unavailable.  Pt is not in her room.  Likely downstairs for EGD.  PT to f/u later today or Monday 11/13/12.    Thanks,   Rollene Rotunda. Horace Lukas, PT, DPT [phone]   11/10/2012, 2:47 PM

## 2012-11-10 NOTE — Progress Notes (Signed)
ANTIBIOTIC CONSULT NOTE - FOLLOW UP  Pharmacy Consult for Levaquin Indication: r/o sepsis  Allergies  Allergen Reactions  . Penicillins Other (See Comments)    Throat swelling     Patient Measurements: Height: 4\' 11"  (149.9 cm) Weight: 192 lb 10.9 oz (87.4 kg) IBW/kg (Calculated) : 43.2  Vital Signs: Temp: 98.1 F (36.7 C) (10/17 0401) Temp src: Oral (10/17 0401) BP: 151/70 mmHg (10/17 1050) Pulse Rate: 87 (10/17 1050) Intake/Output from previous day: 10/16 0701 - 10/17 0700 In: 6672 [P.O.:472; I.V.:6200] Out: -  Intake/Output from this shift: Total I/O In: 120 [P.O.:120] Out: -   Labs:  Recent Labs  11/08/12 0120 11/08/12 0920 11/09/12 0430  WBC 31.6*  --  17.3*  HGB 9.3* 9.5* 10.0*  PLT 244  --  313   Estimated Creatinine Clearance: 60.2 ml/min (by C-G formula based on Cr of 0.7). No results found for this basename: VANCOTROUGH, VANCOPEAK, VANCORANDOM, GENTTROUGH, GENTPEAK, GENTRANDOM, TOBRATROUGH, TOBRAPEAK, TOBRARND, AMIKACINPEAK, AMIKACINTROU, AMIKACIN,  in the last 72 hours   Assessment: 72 y.o. F who continues on D#5 Levaquin initially for SIRS, with R LE cellulitis. afeb WBC 17.3 < 42.3 (on prednisone), SCr 0.7, CrCl~60 ml/min.  Vanc 10/13 >> 10/14 Levaquin 10/13 >>  Azactam 10/13 x 1 dose   10/13 BCx >> ngtd 10/13 UCx >> NG  Goal of Therapy:  Proper antibiotics for infection/cultures adjusted for renal/hepatic function   Plan:  1. Continue Levaquin to 750 mg IV every 24 hours 2. Will continue to follow renal function, culture results, LOT 3. Will consider changing levaquin to po if remains afebrile, and WBC continue improving.  Bayard Hugger, PharmD, BCPS  Clinical Pharmacist  Pager: [phone]   11/10/2012 11:03 AM

## 2012-11-10 NOTE — Op Note (Signed)
[name] [address] [place], [address]   OPERATIVE PROCEDURE REPORT  PATIENT: Andrea Yu, Andrea Yu  MR#: [id number] BIRTHDATE: Jul 28, 1940  GENDER: Female ENDOSCOPIST: Jeani Hawking, MD ASSISTANT:   Karie Soda, technician Jimmey Ralph, RN, CGRN PROCEDURE DATE: 11/10/2012 PROCEDURE:   Colonoscopy, diagnostic ASA CLASS:   Class III INDICATIONS:Anemia. MEDICATIONS: Versed 1mg  IV and Fentanyl 25 mcg IV  DESCRIPTION OF PROCEDURE:   After the risks benefits and alternatives of the procedure were thoroughly explained, informed consent was obtained.  A digital rectal exam revealed no abnormalities of the rectum.    The EC-[id number] ([id number])  endoscope was introduced through the anus  and advanced to the cecum, which was identified by both the appendix and ileocecal valve , No adverse events experienced.    The quality of the prep was good. . The instrument was then slowly withdrawn as the colon was fully examined.     FINDINGS: Extensive washing was required to improve the prep to obtain good to excellent views of the mucosa.  No evidence of any polyps,masses, inflammation, ulcerations, erosions, or vascular abnormalities.  The sigmoid colon exhibited diverticula. The scope was then withdrawn from the patient and the procedure terminated.  COMPLICATIONS: There were no complications.  IMPRESSION: 1) Diverticula. 2) Int/Ext Hemorrhoids.  RECOMMENDATIONS: 1) Follow up with Dr. Loreta Ave in 2-4 weeks. 2) Signing off.  _______________________________ Rosalie DoctorJeani Hawking, MD 11/10/2012 4:29 PM

## 2012-11-10 NOTE — Progress Notes (Signed)
OT Cancellation Note  Patient Details Name: Andrea Yu MRN: [id number] DOB: 04-25-1940   Cancelled Treatment:    Reason Eval/Treat Not Completed: Patient at procedure or test/ unavailable In surgery. [facility] Rozlynn Lippold, OTR/L  [phone] 11/10/2012 11/10/2012, 5:36 PM

## 2012-11-10 NOTE — Progress Notes (Signed)
Patient Andrea Yu 61, pt assymptomanic, pt npo, given 25mg d5 IV, rechecked pt Andrea Yu and was 90. Notified dr. Sullivan, no new orders received at this time, will continue to monitor. 

## 2012-11-10 NOTE — Progress Notes (Signed)
Chart reviewed.  [facility] Progress Note  Andrea Yu ZOX:[id number] DOB: 01-Oct-1940 DOA: 11/06/2012 PCP: Garlan Fillers, MD  Admit HPI / Brief Narrative: 52 F with hx of AF, HTN, psoriatic arthritis presented to [facility] ED 10/13 with fever and weakness x 7 - 10 days.  PT was noted to be febrile and hypotensive in the ED despite NS boluses. [facility] initially evaluated the pt but hypotension persisted so she was admitted to ICU via PCCM.  Assessment/Plan:  SIRS (HR 107, WBC 18.9) w/ Shock with lactic acidosis secondary to unknown source Consider septic, hypovolemic, adrenal insuff, cardiogenic in setting A fib - SIRS physiology has now resolved- clear etiology still not found, but could be related to apparent R LE cellulitis   R LE cellulitis  Pt has wound on R shin - R leg is warmer than L on touch, and is erythematous - WBC trending down - pt is not febrile - cont current abx and follow exam -  doppler negative for DVT - of note MRSA screen was negative - cont Levaquin  Iron deficiency anemia Pt has received an IV Fe load - EGD, TCS pending  B12 deficiency  B12 was 219 - replace with SQ load and f/u in 6-8 weeks as outpt   Questionable chronic GI bleed - had an outpt appt with Dr Loreta Ave for anemia - Hgb baseline 8 but dropped down to 6- transfused total of 3 U PRBC   - FOB negative - Endoscopy in AM by Dr Elnoria Howard    Chronic Parox Atrial fibrillation w RVR As per Cardiology - agree pt is high risk for embolic complications  - will plan to resume anticoag based on EGD results  Adrenal insufficiency On chronic steroids  Psoriatic arthritis Resuming home medical tx  Depression  Hypertension BP currently well controlled   Code Status: NO CODE Family Communication: no family present at time of exam Disposition Plan: home in am if stable  Consultants: PCCM >> [facility] Cardiology GI  Procedures: TTE - Normal EF and wall motion - Pa pressure:   Antibiotics: Aztreonam 10/13 >>>10/13  Levofloxacin 10/13 >>  Vanc 10/13 >> 10/14   DVT prophylaxis: SCDs  HPI/Subjective: Wants to go home She has no complaints. Right leg is without pain.   Objective: Blood pressure 133/83, pulse 86, temperature 98.1 F (36.7 C), temperature source Oral, resp. rate 18, height 4\' 11"  (1.499 m), weight 87.4 kg (192 lb 10.9 oz), SpO2 97.00%.  Intake/Output Summary (Last 24 hours) at 11/10/12 1332 Last data filed at 11/10/12 0900  Gross per 24 hour  Intake   6792 ml  Output      0 ml  Net   6792 ml   Exam: General: No acute respiratory distress at rest Lungs:  Faint bibasilar crackles but no wheeze with good air movement throughout other fields Cardiovascular: Regular rate and rhythm without murmur gallop or rub normal S1 and S2 Abdomen: Nontender, nondistended, soft, bowel sounds positive, no rebound, no ascites, no appreciable mass Extremities: right pretibial area slightly erythematous.  2+ edema  Data Reviewed: Basic Metabolic Panel:  Recent Labs Lab 11/06/12 1015 11/07/12 0334  NA 138 140  K 3.7 3.9  CL 101 108  CO2 26 21  GLUCOSE 87 105*  BUN 15 13  CREATININE 0.81 0.70  CALCIUM 8.2* 7.3*  MG  --  2.0   Liver Function Tests:  Recent Labs Lab 11/06/12 1015 11/07/12 0334  AST 14 21  ALT 11 13  ALKPHOS 66 61  BILITOT 0.2* 0.3  PROT 5.1* 4.6*  ALBUMIN 2.5* 2.1*   CBC:  Recent Labs Lab 11/06/12 1015 11/06/12 2225 11/07/12 0334 11/07/12 0910 11/07/12 2026 11/08/12 0120 11/08/12 0920 11/09/12 0430  WBC 18.9* 42.3* 37.7*  --   --  31.6*  --  17.3*  NEUTROABS 17.6*  --   --   --   --   --   --   --   HGB 6.3* 7.2*  7.2* 7.1* 6.7* 9.4* 9.3* 9.5* 10.0*  HCT 22.7* 25.0*  24.2* 23.7* 22.3* 29.2* 28.8* 30.1* 31.5*  MCV 76.7* 78.1 77.2*  --   --  78.7  --  78.9  PLT 400 341 334  --   --  244  --  313   CBG:  Recent Labs Lab 11/10/12 0541 11/10/12 0747 11/10/12 0830 11/10/12 1044 11/10/12 1146   GLUCAP 73 63* 90 74 77    Recent Results (from the past 240 hour(s))  CULTURE, BLOOD (ROUTINE X 2)     Status: None   Collection Time    11/06/12 10:15 AM      Result Value Range Status   Specimen Description BLOOD RIGHT ARM   Final   Special Requests     Final   Value: BOTTLES DRAWN AEROBIC AND ANAEROBIC BLUE 10CC,RED 5CC   Culture  Setup Time     Final   Value: 11/06/2012 14:02     Performed at [facility]   Culture     Final   Value:        BLOOD CULTURE RECEIVED NO GROWTH TO DATE CULTURE WILL BE HELD FOR 5 DAYS BEFORE ISSUING A FINAL NEGATIVE REPORT     Performed at [facility]   Report Status PENDING   Incomplete  CULTURE, BLOOD (ROUTINE X 2)     Status: None   Collection Time    11/06/12 10:20 AM      Result Value Range Status   Specimen Description BLOOD RIGHT HAND   Final   Special Requests BOTTLES DRAWN AEROBIC ONLY 5CC   Final   Culture  Setup Time     Final   Value: 11/06/2012 14:02     Performed at [facility]   Culture     Final   Value:        BLOOD CULTURE RECEIVED NO GROWTH TO DATE CULTURE WILL BE HELD FOR 5 DAYS BEFORE ISSUING A FINAL NEGATIVE REPORT     Performed at [facility]   Report Status PENDING   Incomplete  URINE CULTURE     Status: None   Collection Time    11/06/12 12:55 PM      Result Value Range Status   Specimen Description URINE, CATHETERIZED   Final   Special Requests NONE   Final   Culture  Setup Time     Final   Value: 11/06/2012 16:33     Performed at [facility] Count     Final   Value: NO GROWTH     Performed at [facility]   Culture     Final   Value: NO GROWTH     Performed at [facility]   Report Status 11/07/2012 FINAL   Final  MRSA PCR SCREENING     Status: None   Collection Time    11/06/12  4:40 PM      Result Value Range Status   MRSA  by PCR NEGATIVE  NEGATIVE Final   Comment:            The GeneXpert MRSA Assay (FDA     approved for NASAL  specimens     only), is one component of a     comprehensive MRSA colonization     surveillance program. It is not     intended to diagnose MRSA     infection nor to guide or     monitor treatment for     MRSA infections.     Studies:  Recent x-ray studies have been reviewed in detail by the Attending Physician  Scheduled Meds:  Scheduled Meds: . apixaban  5 mg Oral BID  . aspirin EC  81 mg Oral QHS  . cyanocobalamin  1,000 mcg Subcutaneous Q1500  . [START ON 11/11/2012] diltiazem  240 mg Oral Daily  . diltiazem  60 mg Oral Q6H  . DULoxetine  90 mg Oral Daily  . [START ON 11/14/2012] etanercept  50 mg Subcutaneous Q Tue  . ferrous sulfate  325 mg Oral BID WC  . furosemide  40 mg Oral Daily  . gabapentin  600 mg Oral BID  . levalbuterol  0.63 mg Nebulization TID  . levofloxacin (LEVAQUIN) IV  750 mg Intravenous Q24H  . magnesium oxide  200 mg Oral Daily  . metoprolol tartrate  25 mg Oral BID  . multivitamin with minerals  1 tablet Oral Daily  . pantoprazole  40 mg Oral Daily  . predniSONE  10 mg Oral Q breakfast  . sodium chloride  3 mL Intravenous Q12H  . Vitamin D (Ergocalciferol)  50,000 Units Oral Q7 days    Time spent on care of this patient: 35 mins   Christiane Ha, MD [phone]  11/10/2012, 1:32 PM   LOS: 4 days

## 2012-11-11 DIAGNOSIS — K2900 Acute gastritis without bleeding: Secondary | ICD-10-CM | POA: Diagnosis present

## 2012-11-11 DIAGNOSIS — L03115 Cellulitis of right lower limb: Secondary | ICD-10-CM | POA: Diagnosis present

## 2012-11-11 LAB — GLUCOSE, CAPILLARY: Glucose-Capillary: 73 mg/dL (ref 70–99)

## 2012-11-11 MED ORDER — DILTIAZEM HCL ER COATED BEADS 180 MG PO CP24: 180.0000 mg | ORAL_CAPSULE | Freq: Every day | ORAL | Status: DC

## 2012-11-11 MED ORDER — OMEPRAZOLE 40 MG PO CPDR: 40.0000 mg | DELAYED_RELEASE_CAPSULE | Freq: Every day | ORAL | Status: DC

## 2012-11-11 MED ORDER — ACETAMINOPHEN 325 MG PO TABS: 650.0000 mg | ORAL_TABLET | ORAL | Status: AC | PRN

## 2012-11-11 MED ORDER — ASPIRIN EC 81 MG PO TBEC
81.0000 mg | DELAYED_RELEASE_TABLET | Freq: Every day | ORAL | Status: DC
  Administered 2012-11-11: 81 mg via ORAL
  Filled 2012-11-11: qty 1

## 2012-11-11 MED ORDER — OMEPRAZOLE 20 MG PO CPDR: 40.0000 mg | DELAYED_RELEASE_CAPSULE | Freq: Every day | ORAL | Status: DC

## 2012-11-11 MED ORDER — APIXABAN 5 MG PO TABS: 5.0000 mg | ORAL_TABLET | Freq: Two times a day (BID) | ORAL | Status: DC

## 2012-11-11 MED ORDER — DOXYCYCLINE HYCLATE 100 MG PO TABS: 100.0000 mg | ORAL_TABLET | Freq: Two times a day (BID) | ORAL | Status: DC

## 2012-11-11 NOTE — Discharge Summary (Signed)
Physician Discharge Summary  Andrea Yu ZOX:[id number] DOB: Feb 01, 1940 DOA: 11/06/2012  PCP: Garlan Fillers, MD  Admit date: 11/06/2012 Discharge date: 11/11/2012  Time spent: greater than 30 minutes  Recommendations for Outpatient Follow-up:  1. Check CBC 2. May resume Eloquis 2 weeks.  3. 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 female History of psoriatic arthritis, atrial fibrillation recently started on Elquis, presents to the hospital for fever and rigors. Patient states she woke up this morning was feeling okay.  At that time patient started feeling very feverish and had rigors. She denies any recent travel any sick contacts. She did receive her influenza vaccination in September of 2014. Patient states that she does see a cardiologist. She was recently started on Eliquis 2 weeks ago for atrial fibrillation. Patient states she is on aspirin prior to that. At this time patient denies any coughing, headache, wheezing, chest pain, shortness of breath, abdominal pain, nausea vomiting, diarrhea, vaginal discharge, insect bites, rash. She denies any new medications. Patient states she tried taking Tylenol however this did not help.  In the emergency room, patient was found  to be hypotensive  Hospital Course:  SIRS (HR 107, WBC 18.9) w/ Shock with lactic acidosis secondary to unknown source  Initially admitted by hospitalist. Transferred to critical care/ICU for persistent hypotension.  Required pressors, stress dose steroids, empiric antibotics.   Cultures negative.  Consider septic, hypovolemic, adrenal insuff, cardiogenic in setting A fib - SIRS physiology has now resolved- does have evidence of R LE cellulitis , the only source found R LE cellulitis  doppler negative for DVT improving Iron deficiency anemia  Pt has received an IV Fe load. EGD did show a friable area of mucosa which bled slightly from minor trauma. Therefore, Elequis will be held for 2 weeks. Stop Celebrex. Continue proton pump inhibitor. Chronic Parox Atrial fibrillation w RVR  Resume Elequis 2 weeks. Adrenal insufficiency  On chronic steroids   Procedures:   EGD FINDINGS: In the proximal gastric body a friable section of mucosa  was noted. It oozed with minor trauma. No evidence of any  ulcerations, erosions, polyps, masses, or vascular abnormalities.  Cold biopsies were obtained of this friable area. The  scope was then withdrawn from the patient and the procedure  terminated.  COMPLICATIONS: There were no complications.  IMPRESSION:  1) Proximal gastric body gastritis.  RECOMMENDATIONS:  1) Await biopsy results.  2) Proceed with the colonoscopy.   Colonoscopy 1) Diverticula.  2) Int/Ext Hemorrhoids.   Consultations:  PCCM >> [facility]  Cardiology  GI   Discharge Exam: Filed Vitals:   11/11/12 0500  BP: 150/83  Pulse: 102  Temp: 97.9 F (36.6 C)  Resp: 20    General: comfortable Cardiovascular: RRR without MGR Respiratory: CTA Ext: minimal right leg  erythema  Discharge Instructions  Discharge Orders   Future Appointments Provider Department Dept Phone   11/21/2012 1:45 PM Thurmon Fair, MD [facility] [phone]   Future Orders Complete By  Expires   Diet - low sodium heart healthy  As directed    Increase activity slowly  As directed        Medication List    STOP taking these medications       aspirin EC 81 MG tablet     celecoxib 200 MG capsule  Commonly known as:  CELEBREX     vitamin C 100 MG tablet      TAKE these medications       acetaminophen 325 MG tablet  Commonly known as:  TYLENOL  Take 2 tablets (650 mg total) by mouth every 4 (four) hours as needed.     apixaban 5 MG Tabs tablet  Commonly known as:  ELIQUIS  Take 1 tablet (5 mg total) by mouth 2 (two) times daily.     BIOTIN 5000 PO  Take 5,000 mg by mouth at bedtime.     CoQ-10 100 MG Caps  Take 100 mg by mouth daily.     diltiazem 180 MG 24 hr capsule  Commonly known as:  CARDIZEM CD  Take 1 capsule (180 mg total) by mouth daily.     doxycycline 100 MG tablet  Commonly known as:  VIBRA-TABS  Take 1 tablet (100 mg total) by mouth 2 (two) times daily.     DULoxetine 30 MG capsule  Commonly known as:  CYMBALTA  Take 30 mg by mouth daily. Take along with 60mg  to make a total of 90mg      DULoxetine 60 MG capsule  Commonly known as:  CYMBALTA  Take 60 mg by mouth daily. Take along with 30mg  capsule to make a total of 90mg      estradiol 2 MG tablet  Commonly known as:  ESTRACE  Take 2 mg by mouth daily.     etanercept 50 MG/ML injection  Commonly known as:  ENBREL  Inject 50 mg into the skin once a week. Tuesday     ferrous sulfate 325 (65 FE) MG EC tablet  Take 325 mg by mouth daily with breakfast.     FLAXSEED (LINSEED) PO  Take 1,200 mg by mouth daily.     furosemide 40 MG tablet  Commonly known as:  LASIX  Take 40 mg by mouth daily.     gabapentin 600 MG tablet  Commonly known as:  NEURONTIN  Take 600 mg by mouth 2 (two) times daily.     HYDROcodone-acetaminophen 5-325 MG per tablet  Commonly known as:  NORCO/VICODIN  Take 1 tablet by mouth every 6 (six) hours as needed for pain.     Magnesium 250 MG Tabs  Take 1  tablet by mouth daily.     metoprolol succinate 50 MG 24 hr tablet  Commonly known as:  TOPROL-XL  Take 100 mg by mouth daily. Take along with 25mg  toprol xl     metoprolol succinate 25 MG 24 hr tablet  Commonly known as:  TOPROL-XL  Take 25 mg by mouth daily. Take with Toprol XL 50mg  tablet     multivitamin with minerals Tabs tablet  Take 1 tablet by mouth daily.     omeprazole 40 MG capsule  Commonly known as:  PRILOSEC  Take 1 capsule (40 mg total) by mouth daily.     potassium chloride SA 20 MEQ tablet  Commonly known as:  K-DUR,KLOR-CON  Take 40 mEq by mouth at bedtime.     predniSONE 5 MG tablet  Commonly known as:  DELTASONE  Take 10 mg by mouth daily.     Vitamin D (Ergocalciferol) 50000 UNITS Caps capsule  Commonly known as:  DRISDOL  Take 50,000 Units by mouth every 7 (seven) days. Tuesday       Allergies  Allergen Reactions  . Penicillins Other (See Comments)    Throat swelling        Follow-up Information   Follow up with Garlan Fillers, MD In 2 weeks. (TO MONITOR HEMOGLOBIN, LEG INFECTION)    Specialty:  Internal Medicine   Contact information:   [address] [facility] [place] [address] [phone]        The results of significant diagnostics from this hospitalization (including imaging, microbiology, ancillary and laboratory) are listed below for reference.    Significant Diagnostic Studies: Dg Chest 2 View  11/06/2012   CLINICAL DATA:  Fever, chills.  EXAM: CHEST  2 VIEW  COMPARISON:  08/09/2012  FINDINGS: The heart size and mediastinal contours are within normal limits. Both lungs are clear. Degenerative changes throughout the lower thoracic and visualized upper lumbar spine.  IMPRESSION: No active cardiopulmonary disease.   Electronically Signed   By: Charlett Nose M.D.   On: 11/06/2012 10:09   Dg Chest Port 1 View  11/07/2012   CLINICAL DATA:  Shortness of breath, airspace disease, follow-up  EXAM:  PORTABLE CHEST - 1 VIEW  COMPARISON:  Chest x-ray of 11/06/2012  FINDINGS: The lungs are clear although aeration is suboptimal. Heart size is stable and mediastinal contours are stable. There are degenerative changes noted in both shoulders.  IMPRESSION: No active lung disease.   Electronically Signed   By: Dwyane Dee M.D.   On: 11/07/2012 07:17   ECHO Left ventricle: The cavity size was normal. Systolic function was normal. Wall motion was normal; there were no regional wall motion abnormalities. Left ventricular diastolic function parameters were normal. - Aortic valve: Trivial regurgitation. - Left atrium: The atrium was mildly dilated. - Atrial septum: No defect or patent foramen ovale was identified. - Pulmonary arteries: Systolic pressure was mildly increased. PA peak pressure: 37mm Hg (S).  RIGHT LEG DOPPLER No evidence of deep vein thrombosis involving the right lower extremity. - No evidence of Baker's cyst on the right  Microbiology: Recent Results (from the past 240 hour(s))  CULTURE, BLOOD (ROUTINE X 2)     Status: None   Collection Time    11/06/12 10:15 AM      Result Value Range Status   Specimen Description BLOOD RIGHT ARM   Final   Special Requests     Final   Value: BOTTLES DRAWN AEROBIC AND ANAEROBIC BLUE 10CC,RED 5CC   Culture  Setup Time     Final   Value: 11/06/2012 14:02     Performed at [facility]   Culture     Final   Value:        BLOOD CULTURE RECEIVED NO GROWTH TO DATE CULTURE WILL BE HELD FOR 5 DAYS BEFORE ISSUING A FINAL NEGATIVE REPORT     Performed at [facility]   Report Status PENDING   Incomplete  CULTURE, BLOOD (ROUTINE X 2)     Status: None   Collection Time    11/06/12 10:20 AM      Result Value Range Status   Specimen Description BLOOD  RIGHT HAND   Final   Special Requests BOTTLES DRAWN AEROBIC ONLY 5CC   Final   Culture  Setup Time     Final   Value: 11/06/2012 14:02     Performed at [facility]   Culture      Final   Value:        BLOOD CULTURE RECEIVED NO GROWTH TO DATE CULTURE WILL BE HELD FOR 5 DAYS BEFORE ISSUING A FINAL NEGATIVE REPORT     Performed at [facility]   Report Status PENDING   Incomplete  URINE CULTURE     Status: None   Collection Time    11/06/12 12:55 PM      Result Value Range Status   Specimen Description URINE, CATHETERIZED   Final   Special Requests NONE   Final   Culture  Setup Time     Final   Value: 11/06/2012 16:33     Performed at [facility] Count     Final   Value: NO GROWTH     Performed at [facility]   Culture     Final   Value: NO GROWTH     Performed at [facility]   Report Status 11/07/2012 FINAL   Final  MRSA PCR SCREENING     Status: None   Collection Time    11/06/12  4:40 PM      Result Value Range Status   MRSA by PCR NEGATIVE  NEGATIVE Final   Comment:            The GeneXpert MRSA Assay (FDA     approved for NASAL specimens     only), is one component of a     comprehensive MRSA colonization     surveillance program. It is not     intended to diagnose MRSA     infection nor to guide or     monitor treatment for     MRSA infections.     Labs: Basic Metabolic Panel:  Recent Labs Lab 11/06/12 1015 11/07/12 0334  NA 138 140  K 3.7 3.9  CL 101 108  CO2 26 21  GLUCOSE 87 105*  BUN 15 13  CREATININE 0.81 0.70  CALCIUM 8.2* 7.3*  MG  --  2.0   Liver Function Tests:  Recent Labs Lab 11/06/12 1015 11/07/12 0334  AST 14 21  ALT 11 13  ALKPHOS 66 61  BILITOT 0.2* 0.3  PROT 5.1* 4.6*  ALBUMIN 2.5* 2.1*   No results found for this basename: LIPASE, AMYLASE,  in the last 168 hours No results found for this basename: AMMONIA,  in the last 168 hours CBC:  Recent Labs Lab 11/06/12 1015 11/06/12 2225 11/07/12 0334 11/07/12 0910 11/07/12 2026 11/08/12 0120 11/08/12 0920 11/09/12 0430  WBC 18.9* 42.3* 37.7*  --   --  31.6*  --  17.3*  NEUTROABS 17.6*  --   --   --   --    --   --   --   HGB 6.3* 7.2*  7.2* 7.1* 6.7* 9.4* 9.3* 9.5* 10.0*  HCT 22.7* 25.0*  24.2* 23.7* 22.3* 29.2* 28.8* 30.1* 31.5*  MCV 76.7* 78.1 77.2*  --   --  78.7  --  78.9  PLT 400 341 334  --   --  244  --  313   Cardiac Enzymes:  Recent Labs Lab 11/06/12 1604  TROPONINI <0.30   BNP: BNP (last  3 results) No results found for this basename: PROBNP,  in the last 8760 hours CBG:  Recent Labs Lab 11/10/12 1044 11/10/12 1146 11/10/12 1744 11/11/12 0015 11/11/12 0749  GLUCAP 74 77 103* 102* 73   Signed:  Keyanni Whittinghill L  [facility] 11/11/2012, 8:36 AM

## 2012-11-12 LAB — CULTURE, BLOOD (ROUTINE X 2): Culture: NO GROWTH

## 2012-11-13 ENCOUNTER — Encounter (HOSPITAL_COMMUNITY): Payer: Self-pay | Admitting: Gastroenterology

## 2012-11-21 ENCOUNTER — Ambulatory Visit (INDEPENDENT_AMBULATORY_CARE_PROVIDER_SITE_OTHER): Payer: Medicare Other | Admitting: Cardiovascular Disease

## 2012-11-21 ENCOUNTER — Encounter: Payer: Self-pay | Admitting: Cardiovascular Disease

## 2012-11-21 VITALS — BP 130/62 | HR 82 | Ht 59.0 in | Wt 181.9 lb

## 2012-11-21 DIAGNOSIS — I480 Paroxysmal atrial fibrillation: Secondary | ICD-10-CM

## 2012-11-21 DIAGNOSIS — I4891 Unspecified atrial fibrillation: Secondary | ICD-10-CM

## 2012-11-21 MED ORDER — ASPIRIN EC 81 MG PO TBEC: 81.0000 mg | DELAYED_RELEASE_TABLET | Freq: Every day | ORAL | Status: DC

## 2012-11-21 NOTE — Patient Instructions (Signed)
STOP Eliquis.  START Aspirin 81mg  daily.  Your physician recommends that you schedule a follow-up appointment in: 3 months.

## 2012-11-26 NOTE — Assessment & Plan Note (Signed)
Cardioversion is obviously unnecessary, and in the absence of any symptoms I do not think she would benefit from antiarrhythmic therapy. Neither antiarrhythmics nor radiofrequency ablation can provide sufficient guarantee that a true fibrillation will not recur to where it would change our position on anticoagulants. The only reason to proceed with high-risk intervention such as antiarrhythmics or ablation would therefore be symptoms. She is completely asymptomatic as far as the arrhythmia goes. Unfortunately, I now believe that the risk of full anticoagulation exceeds its benefits. The anemia itself is a problem since an exact source of bleeding cannot be identified and removed. On top of that she appears to be at high-risk of serious injury from falls. I believe she should return to aspirin for stroke prevention, with a lower risk of series bleeding complications.

## 2012-11-26 NOTE — Progress Notes (Signed)
Patient ID: Andrea Yu, female   DOB: 17-May-1940, 72 y.o.   MRN: [id number]      Reason for office visit Atrial fibrillation   Since her last office visit, when we were discussing possible cardioversion, Mrs. Debroah Baller has been admitted to the hospital with what appears to have been septic shock. No clear source of infection was identified, but was likely that she had cellulitis of the right lower extremity. The infection did not appear overwhelming, and it is possible that she had such a profound hemodynamic collapse because she has iatrogenic adrenal insufficiency. She takes prednisone for psoriatic arthritis. During that same hospitalization she required transfusion for iron deficiency anemia. She was found to have gastritis by endoscopy. Her anticoagulant was temporarily stopped but has been restarted. Unfortunately she has had at least 3 falls since returning home and appears to be at serious risk for injury. She has peripheral neuropathy and seems to have difficulty with gait stability.  Today she is in normal sinus rhythm.   Allergies  Allergen Reactions  . Penicillins Other (See Comments)    Throat swelling     Current Outpatient Prescriptions  Medication Sig Dispense Refill  . acetaminophen (TYLENOL) 325 MG tablet Take 2 tablets (650 mg total) by mouth every 4 (four) hours as needed.      Marland Kitchen BIOTIN 5000 PO Take 5,000 mg by mouth at bedtime.       . Coenzyme Q10 (COQ-10) 100 MG CAPS Take 100 mg by mouth daily.       Marland Kitchen diltiazem (CARDIZEM CD) 180 MG 24 hr capsule Take 1 capsule (180 mg total) by mouth daily.      . DULoxetine (CYMBALTA) 30 MG capsule Take 30 mg by mouth daily. Take along with 60mg  to make a total of 90mg       . DULoxetine (CYMBALTA) 60 MG capsule Take 60 mg by mouth daily. Take along with 30mg  capsule to make a total of 90mg       . estradiol (ESTRACE) 2 MG tablet Take 2 mg by mouth daily.      Marland Kitchen etanercept (ENBREL) 50 MG/ML injection Inject 50 mg into the skin  once a week. Tuesday      . ferrous sulfate 325 (65 FE) MG EC tablet Take 325 mg by mouth daily with breakfast.      . FLAXSEED, LINSEED, PO Take 1,200 mg by mouth daily.      . furosemide (LASIX) 40 MG tablet Take 40 mg by mouth daily.      Marland Kitchen gabapentin (NEURONTIN) 600 MG tablet Take 600 mg by mouth 2 (two) times daily.      Marland Kitchen HYDROcodone-acetaminophen (NORCO/VICODIN) 5-325 MG per tablet Take 1 tablet by mouth every 6 (six) hours as needed for pain.      . Magnesium 250 MG TABS Take 1 tablet by mouth daily.      . metoprolol succinate (TOPROL-XL) 50 MG 24 hr tablet Take 125 mg by mouth daily. Take with or immediately following a meal.      . Multiple Vitamin (MULTIVITAMIN WITH MINERALS) TABS Take 1 tablet by mouth daily.      Marland Kitchen omeprazole (PRILOSEC) 40 MG capsule Take 1 capsule (40 mg total) by mouth daily.  30 capsule  0  . potassium chloride SA (K-DUR,KLOR-CON) 20 MEQ tablet Take 40 mEq by mouth at bedtime.       . predniSONE (DELTASONE) 5 MG tablet Take 10 mg by mouth daily.      Marland Kitchen  Vitamin D, Ergocalciferol, (DRISDOL) 50000 UNITS CAPS Take 50,000 Units by mouth every 7 (seven) days. Tuesday      . aspirin EC 81 MG tablet Take 1 tablet (81 mg total) by mouth daily.  90 tablet  3   No current facility-administered medications for this visit.    Past Medical History  Diagnosis Date  . Hypertension   . Peripheral neuropathy     "from back OR in 2005; in hands and feet" (11/07/2012)  . Anemia   . History of blood transfusion     "after 1 of my knee ORs and today" (11/07/2012)  . A-fib     "just after knee 2nd OR" (11/07/2012)  . 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 Back surgery    . Replacement total knee bilateral Bilateral 2007  . Cholecystectomy  2000's  . Joint replacement    . Vaginal hysterectomy  1983  . Tubal ligation  1974  . Cataract extraction w/ intraocular lens  implant,  bilateral Bilateral 2007  . Posterior lumbar fusion  2005    "got a rod and 4 pins in there" (11/07/2012)  . Colonoscopy N/A 11/10/2012    Procedure: COLONOSCOPY;  Surgeon: Theda Belfast, MD;  Location: [facility] ENDOSCOPY;  Service: Endoscopy;  Laterality: N/A;  . Esophagogastroduodenoscopy N/A 11/10/2012    Procedure: ESOPHAGOGASTRODUODENOSCOPY (EGD);  Surgeon: Theda Belfast, MD;  Location[facility] ENDOSCOPY;  Service: Endoscopy;  Laterality: N/A;    Family History  Problem Relation Age of Onset  . Heart attack Father     1951-deceased   . Aneurysm Son 35    Deceased     History   Social History  . Marital Status: Married    Spouse Name: N/A    Number of Children: N/A  . Years of Education: N/A   Occupational History  . Not on file.   Social History Main Topics  . Smoking status: Former Smoker -- 0.50 packs/day for 30 years    Types: Cigarettes    Quit date: 08/09/1981  . Smokeless tobacco: Never Used  . Alcohol Use: No  . Drug Use: No  . Sexual Activity: Not Currently   Other Topics Concern  . Not on file   Social History Narrative  . No narrative on file    Review of systems: The patient specifically denies any chest pain at rest or with exertion, dyspnea at rest, orthopnea, paroxysmal nocturnal dyspnea, syncope, palpitations, focal neurological deficits, intermittent claudication, lower extremity edema, unexplained weight gain, cough, hemoptysis or wheezing.  The patient also denies abdominal pain, nausea, vomiting, dysphagia, diarrhea, constipation, polyuria, polydipsia, dysuria, hematuria, frequency, urgency, abnormal bleeding or bruising, fever, chills, unexpected weight changes, mood swings, change in skin or hair texture, change in voice quality, auditory or visual problems, allergic reactions or rashes, new musculoskeletal complaints other than usual "aches and pains".   PHYSICAL EXAM BP 130/62  Pulse 82  Ht 4\' 11"  (1.499 m)  Wt 181 lb 14.4 oz (82.509 kg)  BMI  36.72 kg/m2 General: Alert, oriented x3, no distress  Head: no evidence of trauma, PERRL, EOMI, no exophtalmos or lid lag, no myxedema, no xanthelasma; normal ears, nose and oropharynx  Neck: normal jugular venous pulsations and no hepatojugular reflux; brisk carotid pulses without delay and no carotid bruits  Chest: clear to auscultation, no signs of consolidation by percussion or palpation, normal fremitus, symmetrical  and full respiratory excursions  Cardiovascular: normal position and quality of the apical impulse, regular rhythm, normal first and second heart sounds, no murmurs, rubs or gallops  Abdomen: no tenderness or distention, no masses by palpation, no abnormal pulsatility or arterial bruits, normal bowel sounds, no hepatosplenomegaly  Extremities: no clubbing, cyanosis or edema; 2+ radial, ulnar and brachial pulses bilaterally; 2+ right femoral, posterior tibial and dorsalis pedis pulses; 2+ left femoral, posterior tibial and dorsalis pedis pulses; no subclavian or femoral bruits  Neurological: grossly nonfocal  Skin: Psoriatic lesions especially seen on the extensor surfaces of her forearms and knees.   EKG: Sinus rhythm, normal tracing  Lipid Panel  No results found for this basename: chol, trig, hdl, cholhdl, vldl, ldlcalc    BMET    Component Value Date/Time   NA 140 11/07/2012 0334   K 3.9 11/07/2012 0334   CL 108 11/07/2012 0334   CO2 21 11/07/2012 0334   GLUCOSE 105* 11/07/2012 0334   BUN 13 11/07/2012 0334   CREATININE 0.70 11/07/2012 0334   CALCIUM 7.3* 11/07/2012 0334   GFRNONAA 84* 11/07/2012 0334   GFRAA >90 11/07/2012 0334     ASSESSMENT AND PLAN PAF (paroxysmal atrial fibrillation) Cardioversion is obviously unnecessary, and in the absence of any symptoms I do not think she would benefit from antiarrhythmic therapy. Neither antiarrhythmics nor radiofrequency ablation can provide sufficient guarantee that a true fibrillation will not recur to where it  would change our position on anticoagulants. The only reason to proceed with high-risk intervention such as antiarrhythmics or ablation would therefore be symptoms. She is completely asymptomatic as far as the arrhythmia goes. Unfortunately, I now believe that the risk of full anticoagulation exceeds its benefits. The anemia itself is a problem since an exact source of bleeding cannot be identified and removed. On top of that she appears to be at high-risk of serious injury from falls. I believe she should return to aspirin for stroke prevention, with a lower risk of series bleeding complications.   Orders Placed This Encounter  Procedures  . EKG 12-Lead   Meds ordered this encounter  Medications  . metoprolol succinate (TOPROL-XL) 50 MG 24 hr tablet    Sig: Take 125 mg by mouth daily. Take with or immediately following a meal.  . aspirin EC 81 MG tablet    Sig: Take 1 tablet (81 mg total) by mouth daily.    Dispense:  90 tablet    Refill:  3    Jalesia Loudenslager  Thurmon Fair, MD, [facility] [phone] office [phone] pager

## 2013-02-22 ENCOUNTER — Encounter: Payer: Self-pay | Admitting: Cardiovascular Disease

## 2013-02-22 ENCOUNTER — Ambulatory Visit (INDEPENDENT_AMBULATORY_CARE_PROVIDER_SITE_OTHER): Payer: Medicare Other | Admitting: Cardiovascular Disease

## 2013-02-22 VITALS — BP 120/70 | HR 80 | Resp 20 | Ht 59.0 in | Wt 188.0 lb

## 2013-02-22 DIAGNOSIS — I480 Paroxysmal atrial fibrillation: Secondary | ICD-10-CM

## 2013-02-22 DIAGNOSIS — I4891 Unspecified atrial fibrillation: Secondary | ICD-10-CM

## 2013-02-22 MED ORDER — METOPROLOL SUCCINATE ER 50 MG PO TB24: 100.0000 mg | ORAL_TABLET | Freq: Every day | ORAL | Status: DC

## 2013-02-22 NOTE — Patient Instructions (Signed)
Decrease Metoprolol to 100mg  daily.  Your physician recommends that you schedule a follow-up appointment in: ONE YEAR

## 2013-03-03 ENCOUNTER — Encounter: Payer: Self-pay | Admitting: Cardiovascular Disease

## 2013-03-03 NOTE — Assessment & Plan Note (Addendum)
She appears oblivious to rhythm changes. Cardioversion and antiarrhythmics are not justified. Continue ASA for embolism prophylaxis due to fall risk. She needs both metoprolol and diltiazem for rate control.

## 2013-03-03 NOTE — Progress Notes (Signed)
Patient ID: Andrea Yu, female   DOB: 04-14-1940, 73 y.o.   MRN: [id number]      Reason for office visit atrial fibrillation   Andrea Yu may have settled into what appears to be chronic/permanent atrial fibrillation. Today she is in AF, but unaware of the arrhythmia.We had planned cardioversion, but after a hospitalization for sepsis and numerous falls, it has become apparent that she is not an appropriate candidate for anticoagulation. Her falls appear to be related to worsening peripheral neuropathy, maybe also her psoriatic arthritis. She had anemia, but latest labs show resolution, no bleeding source has been identified.  She has had no chest pain, SOB or dizziness. Her gait remains unsteady. Recently treated with an antibiotic for URI. Most of the time she only takes metoprolol once daily.  She is to see an urologist for microscopic hematuria. She is gradually reducing her dose of prednisone   Allergies  Allergen Reactions  . Penicillins Other (See Comments)    Throat swelling     Current Outpatient Prescriptions  Medication Sig Dispense Refill  . acetaminophen (TYLENOL) 325 MG tablet Take 2 tablets (650 mg total) by mouth every 4 (four) hours as needed.      Marland Kitchen aspirin EC 81 MG tablet Take 1 tablet (81 mg total) by mouth daily.  90 tablet  3  . BIOTIN 5000 PO Take 5,000 mg by mouth at bedtime.       . Coenzyme Q10 (COQ-10) 100 MG CAPS Take 100 mg by mouth daily.       Marland Kitchen diltiazem (CARDIZEM CD) 180 MG 24 hr capsule Take 1 capsule (180 mg total) by mouth daily.      . DULoxetine (CYMBALTA) 60 MG capsule Take 60 mg by mouth daily. Take along with 30mg  capsule to make a total of 90mg       . estradiol (ESTRACE) 2 MG tablet Take 2 mg by mouth daily.      etanercept (ENBREL) 50 MG/ML injection Inject 50 mg into the skin once a week. Tuesday      . ferrous sulfate 325 (65 FE) MG EC tablet Take 325 mg by mouth daily with breakfast.      . FLAXSEED, LINSEED, PO Take 1,200 mg by  mouth daily.      . furosemide (LASIX) 40 MG tablet Take 40 mg by mouth daily.      Marland Kitchen gabapentin (NEURONTIN) 600 MG tablet Take 600 mg by mouth 2 (two) times daily.      Tuesday HYDROcodone-acetaminophen (NORCO/VICODIN) 5-325 MG per tablet Take 1 tablet by mouth every 6 (six) hours as needed for pain.      . Magnesium 250 MG TABS Take 1 tablet by mouth daily.      . metoprolol succinate (TOPROL-XL) 50 MG 24 hr tablet Take 2 tablets (100 mg total) by mouth daily. Take with or immediately following a meal.  90 tablet  3  . Multiple Vitamin (MULTIVITAMIN WITH MINERALS) TABS Take 1 tablet by mouth daily.      Marland Kitchen omeprazole (PRILOSEC) 40 MG capsule Take 1 capsule (40 mg total) by mouth daily.  30 capsule  0  . potassium chloride SA (K-DUR,KLOR-CON) 20 MEQ tablet Take 40 mEq by mouth at bedtime.       . predniSONE (DELTASONE) 5 MG tablet Take 10 mg by mouth daily.      . Vitamin D, Ergocalciferol, (DRISDOL) 50000 UNITS CAPS Take 50,000 Units by mouth every 7 (seven) days. Tuesday      .  PROAIR HFA 108 (90 BASE) MCG/ACT inhaler as needed.       No current facility-administered medications for this visit.    Past Medical History  Diagnosis Date  . Hypertension   . Peripheral neuropathy     "from back OR in 2005; in hands and feet" (11/07/2012)  . Anemia   . History of blood transfusion     "after 1 of my knee ORs and today" (11/07/2012)  . A-fib     "just after knee 2nd OR" (11/07/2012)  . 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 Back surgery    . Replacement total knee bilateral Bilateral 2007  . Cholecystectomy  2000's  . Joint replacement    . Vaginal hysterectomy  1983  . Tubal ligation  1974  . Cataract extraction w/ intraocular lens  implant, bilateral Bilateral 2007  . Posterior lumbar fusion  2005    "got a rod and 4 pins in there" (11/07/2012)  . Colonoscopy N/A 11/10/2012    Procedure:  COLONOSCOPY;  Surgeon: Theda Belfast, MD;  Location: [facility] ENDOSCOPY;  Service: Endoscopy;  Laterality: N/A;  . Esophagogastroduodenoscopy N/A 11/10/2012    Procedure: ESOPHAGOGASTRODUODENOSCOPY (EGD);  Surgeon: Theda Belfast, MD;  Location: [facility] ENDOSCOPY;  Service: Endoscopy;  Laterality: N/A;    Family History  Problem Relation Age of Onset  . Heart attack Father     1951-deceased   . Aneurysm Son 4    Deceased     History   Social History  . Marital Status: Married    Spouse Name: N/A    Number of Children: N/A  . Years of Education: N/A   Occupational History  . Not on file.   Social History Main Topics  . Smoking status: Former Smoker -- 0.50 packs/day for 30 years    Types: Cigarettes    Quit date: 08/09/1981  . Smokeless tobacco: Never Used  . Alcohol Use: No  . Drug Use: No  . Sexual Activity: Not Currently   Other Topics Concern  . Not on file   Social History Narrative  . No narrative on file    Review of systems: The patient specifically denies any chest pain at rest or with exertion, dyspnea at rest, orthopnea, paroxysmal nocturnal dyspnea, syncope, palpitations, focal neurological deficits, intermittent claudication, lower extremity edema, unexplained weight gain, cough, hemoptysis or wheezing.  The patient also denies abdominal pain, nausea, vomiting, dysphagia, diarrhea, constipation, polyuria, polydipsia, dysuria, hematuria, frequency, urgency, abnormal bleeding or bruising, fever, chills, unexpected weight changes, mood swings, change in skin or hair texture, change in voice quality, auditory or visual problems, allergic reactions or rashes, new musculoskeletal complaints other than usual "aches and pains".   PHYSICAL EXAM BP 120/70  Pulse 80  Resp 20  Ht 4\' 11"  (1.499 m)  Wt 85.276 kg (188 lb)  BMI 37.95 kg/m2 General: Alert, oriented x3, no distress  Head: no evidence of trauma, PERRL, EOMI, no exophtalmos or lid lag, no myxedema, no xanthelasma;  normal ears, nose and oropharynx  Neck: normal jugular venous pulsations and no hepatojugular reflux; brisk carotid pulses without delay and no carotid bruits  Chest: clear to auscultation, no signs of consolidation by percussion or palpation, normal fremitus, symmetrical and full respiratory excursions  Cardiovascular: normal position and quality of the apical impulse, regular rhythm, normal first and second heart sounds, no murmurs, rubs or gallops  Abdomen: no tenderness or distention, no masses by palpation, no abnormal pulsatility or arterial bruits, normal bowel sounds, no hepatosplenomegaly  Extremities: no clubbing, cyanosis or edema; 2+ radial, ulnar and brachial pulses bilaterally; 2+ right femoral, posterior tibial and dorsalis pedis pulses; 2+ left femoral, posterior tibial and dorsalis pedis pulses; no subclavian or femoral bruits  Neurological: grossly nonfocal  Skin: Psoriatic lesions especially seen on the extensor surfaces of her forearms and knees.  EKG: atrial fibrillation, otherwise normal.   BMET    Component Value Date/Time   NA 140 11/07/2012 0334   K 3.9 11/07/2012 0334   CL 108 11/07/2012 0334   CO2 21 11/07/2012 0334   GLUCOSE 105* 11/07/2012 0334   BUN 13 11/07/2012 0334   CREATININE 0.70 11/07/2012 0334   CALCIUM 7.3* 11/07/2012 0334   GFRNONAA 84* 11/07/2012 0334   GFRAA >90 11/07/2012 0334   January 30, 2013 Creat 0.8 Glucose 91 K 4.6 Normal LFTs Hgb 14.5   ASSESSMENT AND PLAN PAF (paroxysmal atrial fibrillation) She appears oblivious to rhythm changes. Cardioversion and antiarrhythmics are not justified. Continue ASA for embolism prophylaxis due to fall risk. She needs both metoprolol and diltiazem for rate control.      Patient Instructions  Decrease Metoprolol to 100mg  daily.  Your physician recommends that you schedule a follow-up appointment in: ONE YEAR     Jakeim Sedore  Thurmon Fair, MD, [facility] [phone]  office [phone] pager

## 2013-03-09 NOTE — Addendum Note (Signed)
Addended byMarella Bile. on: 03/09/2013 04:35 PM   Modules accepted: Orders

## 2013-03-19 ENCOUNTER — Encounter: Payer: Self-pay | Admitting: Cardiovascular Disease

## 2013-06-25 ENCOUNTER — Other Ambulatory Visit: Payer: Self-pay | Admitting: *Deleted

## 2013-06-25 MED ORDER — METOPROLOL SUCCINATE ER 50 MG PO TB24: 100.0000 mg | ORAL_TABLET | Freq: Every day | ORAL | Status: DC

## 2013-06-25 NOTE — Telephone Encounter (Signed)
Rx was sent to pharmacy electronically. 

## 2014-02-01 ENCOUNTER — Encounter: Payer: Self-pay | Admitting: Cardiovascular Disease

## 2014-02-01 ENCOUNTER — Ambulatory Visit (INDEPENDENT_AMBULATORY_CARE_PROVIDER_SITE_OTHER): Payer: Medicare Other | Admitting: Cardiovascular Disease

## 2014-02-01 VITALS — BP 102/74 | HR 65 | Ht 59.0 in | Wt 187.4 lb

## 2014-02-01 DIAGNOSIS — I480 Paroxysmal atrial fibrillation: Secondary | ICD-10-CM

## 2014-02-01 DIAGNOSIS — E669 Obesity, unspecified: Secondary | ICD-10-CM

## 2014-02-01 MED ORDER — METOPROLOL SUCCINATE ER 100 MG PO TB24: 100.0000 mg | ORAL_TABLET | Freq: Every day | ORAL | Status: DC

## 2014-02-01 NOTE — Progress Notes (Signed)
Patient ID: Andrea Yu, female   DOB: [birth year], 74 y.o.   MRN: [id number]      Reason for office visit Atrial fibrillation  Andrea Yu is in atrial fibrillation again today and seems to be permanently in this rhythm. She remains completely unaware of the arrhythmia. Ventricular rate control is good. She is no longer taking anticoagulation secondary to numerous falls. She has both peripheral neuropathy and severe bilateral knee degenerative joint disease as well as poor support from her shoulders where she has additional orthopedic problems. Just a week ago she had to have her husband and son help pick her up after she fell in the bathroom. 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 Her systolic blood pressure tends to run in the low 100s but is almost never less than 100. She denies dizziness or syncope. Her falls are never associated with lightheadedness.   Allergies  Allergen Reactions  . Penicillins Other (See Comments)    Throat swelling     Current Outpatient Prescriptions  Medication Sig Dispense Refill  . acetaminophen (TYLENOL) 325 MG tablet Take 2 tablets (650 mg total) by mouth every 4 (four) hours as needed.    Marland Kitchen aspirin EC 81 MG tablet Take 1 tablet (81 mg total) by mouth daily. 90 tablet 3  . BIOTIN 5000 PO Take 5,000 mg by mouth at bedtime.     . Coenzyme Q10 (COQ-10) 100 MG CAPS Take 100 mg by mouth daily.     Marland Kitchen diltiazem (CARDIZEM CD) 180 MG 24 hr capsule Take 1 capsule (180 mg total) by mouth daily.    . DULoxetine (CYMBALTA) 30 MG capsule Take along with 60 mg. Total 90 mg    . DULoxetine (CYMBALTA) 60 MG capsule Take 60 mg by mouth daily. Take along with 30mg  capsule to make a total of 90mg     . etanercept (ENBREL) 50 MG/ML injection Inject 50 mg into the skin once a week. Tuesday    . ferrous sulfate 325 (65 FE) MG EC tablet Take 325 mg by mouth daily with breakfast.    . FLAXSEED, LINSEED, PO Take  1,200 mg by mouth daily.    . furosemide (LASIX) 40 MG tablet Take 40 mg by mouth daily.    gabapentin (NEURONTIN) 600 MG tablet 1 tablet in morning and 2 tablets at night    . HYDROcodone-acetaminophen (NORCO/VICODIN) 5-325 MG per tablet Take 1 tablet by mouth every 6 (six) hours as needed for pain.    Tuesday losartan (COZAAR) 100 MG tablet Take 1 tablet by mouth daily.    . Magnesium 250 MG TABS Take 1 tablet by mouth daily.    . metoprolol succinate (TOPROL-XL) 100 MG 24 hr tablet Take 1 tablet (100 mg total) by mouth daily. Take with or immediately following a meal. 90 tablet 3  . Multiple Vitamin (MULTIVITAMIN WITH MINERALS) TABS Take 1 tablet by mouth daily.    Marland Kitchen omeprazole (PRILOSEC) 40 MG capsule Take 1 capsule (40 mg total) by mouth daily. 30 capsule 0  . potassium chloride SA (K-DUR,KLOR-CON) 20 MEQ tablet Take 40 mEq by mouth at bedtime.     . predniSONE (DELTASONE) 5 MG tablet Take 10 mg by mouth daily.    Marland Kitchen PROAIR HFA 108 (90 BASE) MCG/ACT inhaler as needed.    . Vitamin D, Ergocalciferol, (DRISDOL) 50000 UNITS CAPS Take 50,000 Units by mouth every 7 (seven) days. Tuesday  No current facility-administered medications for this visit.    Past Medical History  Diagnosis Date  . Hypertension   . Peripheral neuropathy     "from back OR in 2005; in hands and feet" (11/07/2012)  . Anemia   . History of blood transfusion     "after 1 of my knee ORs and today" (11/07/2012)  . A-fib     "just after knee 2nd OR" (11/07/2012)  . 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 Back surgery    . Replacement total knee bilateral Bilateral 2007  . Cholecystectomy  2000's  . Joint replacement    . Vaginal hysterectomy  1983  . Tubal ligation  1974  . Cataract extraction w/ intraocular lens  implant, bilateral Bilateral 2007  . Posterior lumbar fusion  2005    "got a rod and 4 pins in there" (11/07/2012)    . Colonoscopy N/A 11/10/2012    Procedure: COLONOSCOPY;  Surgeon: Theda Belfast, MD;  Location: [facility] ENDOSCOPY;  Service: Endoscopy;  Laterality: N/A;  . Esophagogastroduodenoscopy N/A 11/10/2012    Procedure: ESOPHAGOGASTRODUODENOSCOPY (EGD);  Surgeon: Theda Belfast, MD;  Location: [facility] ENDOSCOPY;  Service: Endoscopy;  Laterality: N/A;    Family History  Problem Relation Age of Onset  . Heart attack Father     1951-deceased   . Aneurysm Son 49    Deceased     History   Social History  . Marital Status: Married    Spouse Name: N/A    Number of Children: N/A  . Years of Education: N/A   Occupational History  . Not on file.   Social History Main Topics  . Smoking status: Former Smoker -- 0.50 packs/day for 30 years    Types: Cigarettes    Quit date: 08/09/1981  . Smokeless tobacco: Never Used  . Alcohol Use: No  . Drug Use: No  . Sexual Activity: Not Currently   Other Topics Concern  . Not on file   Social History Narrative  . No narrative on file    Review of systems: The patient specifically denies any chest pain at rest or with exertion, dyspnea at rest, orthopnea, paroxysmal nocturnal dyspnea, syncope, palpitations, focal neurological deficits, intermittent claudication, lower extremity edema, unexplained weight gain, cough, hemoptysis or wheezing.  The patient also denies abdominal pain, nausea, vomiting, dysphagia, diarrhea, constipation, polyuria, polydipsia, dysuria, hematuria, frequency, urgency, abnormal bleeding or bruising, fever, chills, unexpected weight changes, mood swings, change in skin or hair texture, change in voice quality, auditory or visual problems, allergic reactions or rashes, new musculoskeletal complaints other than usual "aches and pains".  PHYSICAL EXAM BP 102/74 mmHg  Pulse 65  Ht 4\' 11"  (1.499 m)  Wt 187 lb 6.4 oz (85.004 kg)  BMI 37.83 kg/m2 General: Alert, oriented x3, no distress  Head: no evidence of trauma, PERRL, EOMI, no  exophtalmos or lid lag, no myxedema, no xanthelasma; normal ears, nose and oropharynx  Neck: normal jugular venous pulsations and no hepatojugular reflux; brisk carotid pulses without delay and no carotid bruits  Chest: clear to auscultation, no signs of consolidation by percussion or palpation, normal fremitus, symmetrical and full respiratory excursions  Cardiovascular: normal position and quality of the apical impulse, regular rhythm, normal first and second heart sounds, no murmurs, rubs or gallops  Abdomen: no tenderness or distention, no masses by palpation, no abnormal pulsatility or arterial bruits,  normal bowel sounds, no hepatosplenomegaly  Extremities: no clubbing, cyanosis or edema; 2+ radial, ulnar and brachial pulses bilaterally; 2+ right femoral, posterior tibial and dorsalis pedis pulses; 2+ left femoral, posterior tibial and dorsalis pedis pulses; no subclavian or femoral bruits  Neurological: grossly nonfocal  Skin: Psoriatic lesions especially seen on the extensor surfaces of her forearms and knees.   EKG: Atrial fibrillation, otherwise normal  Lipid Panel  No results found for: CHOL, TRIG, HDL, CHOLHDL, VLDL, LDLCALC, LDLDIRECT  BMET    Component Value Date/Time   NA 140 11/07/2012 0334   K 3.9 11/07/2012 0334   CL 108 11/07/2012 0334   CO2 21 11/07/2012 0334   GLUCOSE 105* 11/07/2012 0334   BUN 13 11/07/2012 0334   CREATININE 0.70 11/07/2012 0334   CALCIUM 7.3* 11/07/2012 0334   GFRNONAA 84* 11/07/2012 0334   GFRAA >90 11/07/2012 0334     ASSESSMENT AND PLAN  Mrs. Gelles has asymptomatic permanent atrial fibrillation with good rate control. Unfortunately only aspirin has been used for stroke prevention since she has very frequent falls and is at high risk of bleeding. She requires both diltiazem and metoprolol to achieve adequate rate control. Her blood pressure is now in the low normal range. If her blood pressure becomes too low at with advocate  reducing the dose of losartan since the other 2 medications are also necessary for arrhythmia rate control.  Orders Placed This Encounter  Procedures  . EKG 12-Lead   Meds ordered this encounter  Medications  . losartan (COZAAR) 100 MG tablet    Sig: Take 1 tablet by mouth daily.  . DULoxetine (CYMBALTA) 30 MG capsule    Sig: Take along with 60 mg. Total 90 mg  . metoprolol succinate (TOPROL-XL) 100 MG 24 hr tablet    Sig: Take 1 tablet (100 mg total) by mouth daily. Take with or immediately following a meal.    Dispense:  90 tablet    Refill:  3    Dorothymae Maciver  Thurmon Fair, MD, [facility] [phone] office [phone] pager

## 2014-02-01 NOTE — Patient Instructions (Signed)
Toprol tablets have been changed to 100mg  tablets.  Take on daily.  A new Rx has been sent to [facility].  Dr. recommends that you schedule a follow-up appointment in: ONE YEAR.

## 2014-02-22 ENCOUNTER — Ambulatory Visit: Payer: Medicare Other | Admitting: Cardiovascular Disease

## 2014-04-11 ENCOUNTER — Other Ambulatory Visit (HOSPITAL_COMMUNITY): Payer: Self-pay | Admitting: *Deleted

## 2014-04-12 ENCOUNTER — Ambulatory Visit (HOSPITAL_COMMUNITY)
Admission: RE | Admit: 2014-04-12 | Discharge: 2014-04-12 | Disposition: A | Discharge status: Home / Self Care | Payer: Medicare Other | Source: Ambulatory Visit | Attending: Internal Medicine | Admitting: Internal Medicine

## 2014-04-12 DIAGNOSIS — M810 Age-related osteoporosis without current pathological fracture: Secondary | ICD-10-CM | POA: Diagnosis not present

## 2014-04-12 MED ORDER — DENOSUMAB 60 MG/ML ~~LOC~~ SOLN
60.0000 mg | Freq: Once | SUBCUTANEOUS | Status: AC
  Administered 2014-04-12: 60 mg via SUBCUTANEOUS
  Filled 2014-04-12: qty 1

## 2014-04-12 NOTE — Discharge Instructions (Signed)
Denosumab injection What is this medicine? DENOSUMAB (den oh sue mab) slows bone breakdown. Prolia is used to treat osteoporosis in women after menopause and in men. Xgeva is used to prevent bone fractures and other bone problems caused by cancer bone metastases. Xgeva is also used to treat giant cell tumor of the bone. This medicine may be used for other purposes; ask your health care provider or pharmacist if you have questions. COMMON BRAND NAME(S): Prolia, XGEVA What should I tell my health care provider before I take this medicine? They need to know if you have any of these conditions: -dental disease -eczema -infection or history of infections -kidney disease or on dialysis -low blood calcium or vitamin D -malabsorption syndrome -scheduled to have surgery or tooth extraction -taking medicine that contains denosumab -thyroid or parathyroid disease -an unusual reaction to denosumab, other medicines, foods, dyes, or preservatives -pregnant or trying to get pregnant -breast-feeding How should I use this medicine? This medicine is for injection under the skin. It is given by a health care professional in a hospital or clinic setting. If you are getting Prolia, a special MedGuide will be given to you by the pharmacist with each prescription and refill. Be sure to read this information carefully each time. For Prolia, talk to your pediatrician regarding the use of this medicine in children. Special care may be needed. For Xgeva, talk to your pediatrician regarding the use of this medicine in children. While this drug may be prescribed for children as young as 13 years for selected conditions, precautions do apply. Overdosage: If you think you've taken too much of this medicine contact a poison control center or emergency room at once. Overdosage: If you think you have taken too much of this medicine contact a poison control center or emergency room at once. NOTE: This medicine is only for  you. Do not share this medicine with others. What if I miss a dose? It is important not to miss your dose. Call your doctor or health care professional if you are unable to keep an appointment. What may interact with this medicine? Do not take this medicine with any of the following medications: -other medicines containing denosumab This medicine may also interact with the following medications: -medicines that suppress the immune system -medicines that treat cancer -steroid medicines like prednisone or cortisone This list may not describe all possible interactions. Give your health care provider a list of all the medicines, herbs, non-prescription drugs, or dietary supplements you use. Also tell them if you smoke, drink alcohol, or use illegal drugs. Some items may interact with your medicine. What should I watch for while using this medicine? Visit your doctor or health care professional for regular checks on your progress. Your doctor or health care professional may order blood tests and other tests to see how you are doing. Call your doctor or health care professional if you get a cold or other infection while receiving this medicine. Do not treat yourself. This medicine may decrease your body's ability to fight infection. You should make sure you get enough calcium and vitamin D while you are taking this medicine, unless your doctor tells you not to. Discuss the foods you eat and the vitamins you take with your health care professional. See your dentist regularly. Brush and floss your teeth as directed. Before you have any dental work done, tell your dentist you are receiving this medicine. Do not become pregnant while taking this medicine or for 5 months after stopping   it. Women should inform their doctor if they wish to become pregnant or think they might be pregnant. There is a potential for serious side effects to an unborn child. Talk to your health care professional or pharmacist for more  information. What side effects may I notice from receiving this medicine? Side effects that you should report to your doctor or health care professional as soon as possible: -allergic reactions like skin rash, itching or hives, swelling of the face, lips, or tongue -breathing problems -chest pain -fast, irregular heartbeat -feeling faint or lightheaded, falls -fever, chills, or any other sign of infection -muscle spasms, tightening, or twitches -numbness or tingling -skin blisters or bumps, or is dry, peels, or red -slow healing or unexplained pain in the mouth or jaw -unusual bleeding or bruising Side effects that usually do not require medical attention (Report these to your doctor or health care professional if they continue or are bothersome.): -muscle pain -stomach upset, gas This list may not describe all possible side effects. Call your doctor for medical advice about side effects. You may report side effects to FDA at 1-800-FDA-1088. Where should I keep my medicine? This medicine is only given in a clinic, doctor's office, or other health care setting and will not be stored at home. NOTE: This sheet is a summary. It may not cover all possible information. If you have questions about this medicine, talk to your doctor, pharmacist, or health care provider.  2015, [employer]. (2011-07-12 12:37:47)  

## 2014-05-23 ENCOUNTER — Telehealth: Payer: Self-pay | Admitting: Cardiovascular Disease

## 2014-05-23 NOTE — Telephone Encounter (Signed)
Received a call from patient she stated she needed [facility] [employer] form faxed back to them within the next hour.Dr.Croitoru signed form.Form faxed back to [facility] at fax # [phone].

## 2014-05-23 NOTE — Telephone Encounter (Signed)
Pt says she brought some papers by for Dr C to sign yesterday. Pt says she need them today by 12 please.

## 2014-07-02 ENCOUNTER — Encounter: Payer: Self-pay | Admitting: *Deleted

## 2014-09-13 ENCOUNTER — Inpatient Hospital Stay (HOSPITAL_COMMUNITY)
Admission: EM | Admit: 2014-09-13 | Discharge: 2014-09-16 | DRG: 086 | Disposition: A | Discharge status: Home / Self Care | Payer: Medicare Other | Attending: Internal Medicine | Admitting: Internal Medicine

## 2014-09-13 ENCOUNTER — Encounter (HOSPITAL_COMMUNITY): Payer: Self-pay

## 2014-09-13 DIAGNOSIS — Z90710 Acquired absence of both cervix and uterus: Secondary | ICD-10-CM

## 2014-09-13 DIAGNOSIS — S01112A Laceration without foreign body of left eyelid and periocular area, initial encounter: Secondary | ICD-10-CM | POA: Diagnosis present

## 2014-09-13 DIAGNOSIS — E669 Obesity, unspecified: Secondary | ICD-10-CM | POA: Diagnosis present

## 2014-09-13 DIAGNOSIS — Z96653 Presence of artificial knee joint, bilateral: Secondary | ICD-10-CM | POA: Diagnosis present

## 2014-09-13 DIAGNOSIS — E2740 Unspecified adrenocortical insufficiency: Secondary | ICD-10-CM | POA: Diagnosis present

## 2014-09-13 DIAGNOSIS — I10 Essential (primary) hypertension: Secondary | ICD-10-CM | POA: Diagnosis present

## 2014-09-13 DIAGNOSIS — Y92002 Bathroom of unspecified non-institutional (private) residence single-family (private) house as the place of occurrence of the external cause: Secondary | ICD-10-CM

## 2014-09-13 DIAGNOSIS — Z79899 Other long term (current) drug therapy: Secondary | ICD-10-CM

## 2014-09-13 DIAGNOSIS — I480 Paroxysmal atrial fibrillation: Secondary | ICD-10-CM | POA: Diagnosis present

## 2014-09-13 DIAGNOSIS — Z7982 Long term (current) use of aspirin: Secondary | ICD-10-CM

## 2014-09-13 DIAGNOSIS — Z880 Allergy status to penicillin: Secondary | ICD-10-CM

## 2014-09-13 DIAGNOSIS — Z6838 Body mass index (BMI) 38.0-38.9, adult: Secondary | ICD-10-CM

## 2014-09-13 DIAGNOSIS — R58 Hemorrhage, not elsewhere classified: Secondary | ICD-10-CM

## 2014-09-13 DIAGNOSIS — S066X0A Traumatic subarachnoid hemorrhage without loss of consciousness, initial encounter: Principal | ICD-10-CM | POA: Diagnosis present

## 2014-09-13 DIAGNOSIS — Z23 Encounter for immunization: Secondary | ICD-10-CM

## 2014-09-13 DIAGNOSIS — Z7952 Long term (current) use of systemic steroids: Secondary | ICD-10-CM

## 2014-09-13 DIAGNOSIS — W01198A Fall on same level from slipping, tripping and stumbling with subsequent striking against other object, initial encounter: Secondary | ICD-10-CM | POA: Diagnosis present

## 2014-09-13 DIAGNOSIS — W19XXXA Unspecified fall, initial encounter: Secondary | ICD-10-CM | POA: Diagnosis present

## 2014-09-13 DIAGNOSIS — Z9049 Acquired absence of other specified parts of digestive tract: Secondary | ICD-10-CM | POA: Diagnosis present

## 2014-09-13 DIAGNOSIS — I609 Nontraumatic subarachnoid hemorrhage, unspecified: Secondary | ICD-10-CM

## 2014-09-13 DIAGNOSIS — L4050 Arthropathic psoriasis, unspecified: Secondary | ICD-10-CM | POA: Diagnosis present

## 2014-09-13 DIAGNOSIS — Z981 Arthrodesis status: Secondary | ICD-10-CM

## 2014-09-13 DIAGNOSIS — D509 Iron deficiency anemia, unspecified: Secondary | ICD-10-CM | POA: Diagnosis present

## 2014-09-13 DIAGNOSIS — G629 Polyneuropathy, unspecified: Secondary | ICD-10-CM | POA: Diagnosis present

## 2014-09-13 DIAGNOSIS — Z87891 Personal history of nicotine dependence: Secondary | ICD-10-CM

## 2014-09-13 HISTORY — DX: Arthropathic psoriasis, unspecified: L40.50

## 2014-09-13 MED ORDER — TETANUS-DIPHTH-ACELL PERTUSSIS 5-2.5-18.5 LF-MCG/0.5 IM SUSP
0.5000 mL | Freq: Once | INTRAMUSCULAR | Status: AC
  Administered 2014-09-14: 0.5 mL via INTRAMUSCULAR
  Filled 2014-09-13: qty 0.5

## 2014-09-13 NOTE — ED Provider Notes (Signed)
CSN: [id number]     Arrival date & time 09/13/14  2345 History  This chart was scribed for Tomasita Crumble, MD by Freida Busman, ED Scribe. This patient was seen in room D31C/D31C and the patient's care was started 11:46 PM.   Chief Complaint  Patient presents with  . Fall   The history is provided by the patient and the EMS personnel.     HPI Comments:  Andrea Yu is a 74 y.o. female brought in by ambulance, who presents to the Emergency Department s/p fall this evening. Per EMS, pt slipped in her urine while on the way to the bathroom and fell slipped backwards. EMS believes she may have injured her head on the door, they report hematoma to the back of her head with 9/10 surrounding pain. EMS also reports facial laceration and abrasion. Pt denies LOC and use of blood thinners other than ASA. She does not take blood thinners due to recent GI bleed. She also denies pain other than surrounding the site of injury. No alleviating factors noted.   Past Medical History  Diagnosis Date  . Hypertension   . Peripheral neuropathy     "from back OR in 2005; in hands and feet" (11/07/2012)  . Anemia   . History of blood transfusion     "after 1 of my knee ORs and today" (11/07/2012)  . A-fib     "just after knee 2nd OR" (11/07/2012)  . 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 Back surgery    . Replacement total knee bilateral Bilateral 2007  . Cholecystectomy  2000's  . Joint replacement    . Vaginal hysterectomy  1983  . Tubal ligation  1974  . Cataract extraction w/ intraocular lens  implant, bilateral Bilateral 2007  . Posterior lumbar fusion  2005    "got a rod and 4 pins in there" (11/07/2012)  . Colonoscopy N/A 11/10/2012    Procedure: COLONOSCOPY;  Surgeon: Theda Belfast, MD;  Location: [facility] ENDOSCOPY;  Service: Endoscopy;  Laterality: N/A;  . Esophagogastroduodenoscopy N/A 11/10/2012    Procedure:  ESOPHAGOGASTRODUODENOSCOPY (EGD);  Surgeon: Theda Belfast, MD;  Location: [facility] ENDOSCOPY;  Service: Endoscopy;  Laterality: N/A;  . Transthoracic echocardiogram  05/24/2006   Family History  Problem Relation Age of Onset  . Heart attack Father     1951-deceased   . Aneurysm Son 53    Deceased   . Liver disease Mother    Social History  Substance Use Topics  . Smoking status: Former Smoker -- 0.50 packs/day for 30 years    Types: Cigarettes    Quit date: 08/09/1981  . Smokeless tobacco: Never Used  . Alcohol Use: No   OB History    No data available     Review of Systems  A complete 10 system review of systems was obtained and all systems are negative except as noted in the HPI and PMH.     Allergies  Penicillins  Home Medications   Prior to Admission medications   Medication Sig Start Date End Date Taking? Authorizing Provider  acetaminophen (TYLENOL) 325 MG tablet Take 2 tablets (650 mg total) by mouth every 4 (four) hours as needed. Patient taking differently: Take 650 mg by mouth every 4 (four) hours as needed for mild pain.  11/11/12  Yes Christiane Ha, MD  aspirin EC 81 MG tablet Take 1  tablet (81 mg total) by mouth daily. 11/21/12  Yes Mihai Croitoru, MD  BIOTIN 5000 PO Take 5,000 mg by mouth at bedtime.    Yes Historical Provider, MD  diltiazem (CARDIZEM CD) 180 MG 24 hr capsule Take 1 capsule (180 mg total) by mouth daily. 11/11/12  Yes Christiane Ha, MD  DULoxetine (CYMBALTA) 30 MG capsule Take along with 60 mg. Total 90 mg 12/14/13  Yes Historical Provider, MD  DULoxetine (CYMBALTA) 60 MG capsule Take 60 mg by mouth daily. Take along with  capsule to make a total of    Yes Historical Provider, MD  etanercept (ENBREL) 50 MG/ML injection Inject 50 mg into the skin once a week. Tuesday   Yes Historical Provider, MD  furosemide (LASIX) 40 MG tablet Take 80 mg by mouth daily.    Yes Historical Provider, MD  gabapentin (NEURONTIN) 600 MG tablet Take  600-1,200 mg by mouth 2 (two) times daily. 1 tablet in morning and 2 tablets at night   Yes Historical Provider, MD  HYDROcodone-acetaminophen (NORCO/VICODIN) 5-325 MG per tablet Take 1 tablet by mouth every 6 (six) hours as needed for pain.   Yes Historical Provider, MD  Magnesium 250 MG TABS Take 1 tablet by mouth daily.   Yes Historical Provider, MD  metoprolol succinate (TOPROL-XL) 100 MG 24 hr tablet Take 1 tablet (100 mg total) by mouth daily. Take with or immediately following a meal. 02/01/14  Yes Mihai Croitoru, MD  Multiple Vitamin (MULTIVITAMIN WITH MINERALS) TABS Take 1 tablet by mouth daily.   Yes Historical Provider, MD  omeprazole (PRILOSEC) 40 MG capsule Take 1 capsule (40 mg total) by mouth daily. 11/11/12  Yes Christiane Ha, MD  potassium chloride SA (K-DUR,KLOR-CON) 20 MEQ tablet Take 40 mEq by mouth 2 (two) times daily.    Yes Historical Provider, MD  predniSONE (DELTASONE) 5 MG tablet Take 7.5 mg by mouth daily.  08/12/12  Yes Rich Tisovec, MD  PROAIR HFA 108 (90 BASE) MCG/ACT inhaler Inhale 1-2 puffs into the lungs every 6 (six) hours as needed for wheezing or shortness of breath.  02/06/13  Yes Historical Provider, MD  Vitamin D, Ergocalciferol, (DRISDOL) 50000 UNITS CAPS Take 50,000 Units by mouth every 7 (seven) days. Tuesday   Yes Historical Provider, MD   BP 150/65 mmHg  Pulse 84  Temp(Src) 98.5 F (36.9 C) (Oral)  Resp 13  Ht  (1.499 m)  Wt 183 lb (83.008 kg)  BMI 36.94 kg/m2  SpO2 99% Physical Exam  Constitutional: She is oriented to person, place, and time. She appears well-developed and well-nourished. No distress.  HENT:  Head: Normocephalic and atraumatic.  Nose: Nose normal.  Mouth/Throat: Oropharynx is clear and moist. No oropharyngeal exudate.  Eyes: Conjunctivae and EOM are normal. Pupils are equal, round, and reactive to light. No scleral icterus.  Neck: Normal range of motion. Neck supple. No JVD present. No tracheal deviation present. No  thyromegaly present.  Cardiovascular: Normal rate, regular rhythm and normal heart sounds.  Exam reveals no gallop and no friction rub.   No murmur heard. Pulmonary/Chest: Effort normal and breath sounds normal. No respiratory distress. She has no wheezes. She exhibits no tenderness.  Abdominal: Soft. Bowel sounds are normal. She exhibits no distension and no mass. There is no tenderness. There is no rebound and no guarding.  Musculoskeletal: Normal range of motion. She exhibits no edema or tenderness.  Lymphadenopathy:    She has no cervical adenopathy.  Neurological: She is alert  and oriented to person, place, and time. No cranial nerve deficit. She exhibits normal muscle tone.  Skin: Skin is warm and dry. No erythema.  1 cm left supraorbital area Large 5 cm hematoma to left posterior scalp Normal strength and sensation all extremities   Nursing note and vitals reviewed.   ED Course  Procedures   DIAGNOSTIC STUDIES:  Oxygen Saturation is 100% on RA, normal by my interpretation.    COORDINATION OF CARE:  11:51 AM Discussed treatment plan with pt at bedside and pt agreed to plan.  Labs Review Labs Reviewed  CBC WITH DIFFERENTIAL/PLATELET  BASIC METABOLIC PANEL  PROTIME-INR    Imaging Review Ct Head Wo Contrast  09/14/2014   ADDENDUM REPORT: 09/14/2014 01:57  ADDENDUM: Upon further evaluation, acute subarachnoid hemorrhage is noted along the left parietal lobe and minimally along the left Sylvian fissure.  Critical Value/emergent results were called by telephone at the time of interpretation on 09/14/2014 at 1:57 am to the ER physician, who verbally acknowledged these results.   Electronically Signed   By: Roanna Raider M.D.   On: 09/14/2014 01:57   09/14/2014   CLINICAL DATA:  Slipped and fell in bathroom. Hit head on side of door. Left posterior scalp laceration and left supraorbital laceration. Concern for cervical spine injury. Initial encounter.  EXAM: CT HEAD WITHOUT  CONTRAST  CT MAXILLOFACIAL WITHOUT CONTRAST  CT CERVICAL SPINE WITHOUT CONTRAST  TECHNIQUE: Multidetector CT imaging of the head, cervical spine, and maxillofacial structures were performed using the standard protocol without intravenous contrast. Multiplanar CT image reconstructions of the cervical spine and maxillofacial structures were also generated.  COMPARISON:  None.  FINDINGS: CT HEAD FINDINGS  There is no evidence of acute infarction, mass lesion, or intra- or extra-axial hemorrhage on CT.  Prominence of the ventricles and sulci reflects mild cortical volume loss. Mild cerebellar atrophy is noted. Scattered periventricular white matter change likely reflects small vessel ischemic microangiopathy.  The brainstem and fourth ventricle are within normal limits. The basal ganglia are unremarkable in appearance. The cerebral hemispheres demonstrate grossly normal gray-white differentiation. No mass effect or midline shift is seen.  There is no evidence of fracture; visualized osseous structures are unremarkable in appearance. The orbits are within normal limits. The paranasal sinuses and mastoid air cells are well-aerated. Soft tissue swelling is noted lateral and superior to the left orbit. Significant soft tissue swelling and laceration are seen at the left posterior parietal calvarium, with a prominent focal 2.4 cm soft tissue hematoma.  CT MAXILLOFACIAL FINDINGS  There is no evidence of fracture or dislocation. The maxilla and mandible appear intact. The nasal bone is unremarkable in appearance. The visualized dentition demonstrates no acute abnormality. Absence of the maxillary teeth is thought to be subacute in nature, though underlying defects are still seen.  The orbits are intact bilaterally. The visualized paranasal sinuses and mastoid air cells are well-aerated.  Soft tissue swelling is noted lateral and superior to the left orbit. The parapharyngeal fat planes are preserved. The nasopharynx,  oropharynx and hypopharynx are unremarkable in appearance. The visualized portions of the valleculae and piriform sinuses are grossly unremarkable.  The parotid and submandibular glands are within normal limits. No cervical lymphadenopathy is seen.  CT CERVICAL SPINE FINDINGS  There is no evidence of acute fracture or subluxation. There is mild grade 1 anterolisthesis of C2 on C3, and diffuse disc space narrowing along the cervical spine, with scattered anterior and posterior disc osteophyte complexes, and underlying facet disease. There  is suggestion of mild grade 1 anterolisthesis of C7 on T1. Vertebral bodies demonstrate normal height.  The thyroid gland is unremarkable in appearance. The visualized lung apices are clear. Retropharyngeal common carotid arteries are seen. Scattered calcification is noted at the carotid bifurcations bilaterally.  IMPRESSION: 1. No evidence of traumatic intracranial injury or fracture. 2. No evidence of fracture or dislocation with regard to the maxillofacial structures. 3. No evidence of acute fracture or subluxation along the cervical spine. 4. Significant soft tissue swelling and laceration at the left posterior parietal calvarium, with a prominent focal 2.4 cm soft tissue hematoma. Soft tissue swelling noted lateral and superior to the left orbit. 5. Absence of the maxillary teeth is thought to be subacute in nature, though underlying defects are still seen. 6. Mild cortical volume loss and scattered small vessel ischemic microangiopathy. 7. Diffuse degenerative change along the cervical spine. 8. Scattered calcification at the carotid bifurcations bilaterally. Carotid ultrasound would be helpful for further evaluation, when and as deemed clinically appropriate.  Electronically Signed: By: Roanna Raider M.D. On: 09/14/2014 01:35   Ct Cervical Spine Wo Contrast  09/14/2014   ADDENDUM REPORT: 09/14/2014 01:57  ADDENDUM: Upon further evaluation, acute subarachnoid hemorrhage is  noted along the left parietal lobe and minimally along the left Sylvian fissure.  Critical Value/emergent results were called by telephone at the time of interpretation on 09/14/2014 at 1:57 am to the ER physician, who verbally acknowledged these results.   Electronically Signed   By: Roanna Raider M.D.   On: 09/14/2014 01:57   09/14/2014   CLINICAL DATA:  Slipped and fell in bathroom. Hit head on side of door. Left posterior scalp laceration and left supraorbital laceration. Concern for cervical spine injury. Initial encounter.  EXAM: CT HEAD WITHOUT CONTRAST  CT MAXILLOFACIAL WITHOUT CONTRAST  CT CERVICAL SPINE WITHOUT CONTRAST  TECHNIQUE: Multidetector CT imaging of the head, cervical spine, and maxillofacial structures were performed using the standard protocol without intravenous contrast. Multiplanar CT image reconstructions of the cervical spine and maxillofacial structures were also generated.  COMPARISON:  None.  FINDINGS: CT HEAD FINDINGS  There is no evidence of acute infarction, mass lesion, or intra- or extra-axial hemorrhage on CT.  Prominence of the ventricles and sulci reflects mild cortical volume loss. Mild cerebellar atrophy is noted. Scattered periventricular white matter change likely reflects small vessel ischemic microangiopathy.  The brainstem and fourth ventricle are within normal limits. The basal ganglia are unremarkable in appearance. The cerebral hemispheres demonstrate grossly normal gray-white differentiation. No mass effect or midline shift is seen.  There is no evidence of fracture; visualized osseous structures are unremarkable in appearance. The orbits are within normal limits. The paranasal sinuses and mastoid air cells are well-aerated. Soft tissue swelling is noted lateral and superior to the left orbit. Significant soft tissue swelling and laceration are seen at the left posterior parietal calvarium, with a prominent focal 2.4 cm soft tissue hematoma.  CT MAXILLOFACIAL  FINDINGS  There is no evidence of fracture or dislocation. The maxilla and mandible appear intact. The nasal bone is unremarkable in appearance. The visualized dentition demonstrates no acute abnormality. Absence of the maxillary teeth is thought to be subacute in nature, though underlying defects are still seen.  The orbits are intact bilaterally. The visualized paranasal sinuses and mastoid air cells are well-aerated.  Soft tissue swelling is noted lateral and superior to the left orbit. The parapharyngeal fat planes are preserved. The nasopharynx, oropharynx and hypopharynx are unremarkable in appearance.  The visualized portions of the valleculae and piriform sinuses are grossly unremarkable.  The parotid and submandibular glands are within normal limits. No cervical lymphadenopathy is seen.  CT CERVICAL SPINE FINDINGS  There is no evidence of acute fracture or subluxation. There is mild grade 1 anterolisthesis of C2 on C3, and diffuse disc space narrowing along the cervical spine, with scattered anterior and posterior disc osteophyte complexes, and underlying facet disease. There is suggestion of mild grade 1 anterolisthesis of C7 on T1. Vertebral bodies demonstrate normal height.  The thyroid gland is unremarkable in appearance. The visualized lung apices are clear. Retropharyngeal common carotid arteries are seen. Scattered calcification is noted at the carotid bifurcations bilaterally.  IMPRESSION: 1. No evidence of traumatic intracranial injury or fracture. 2. No evidence of fracture or dislocation with regard to the maxillofacial structures. 3. No evidence of acute fracture or subluxation along the cervical spine. 4. Significant soft tissue swelling and laceration at the left posterior parietal calvarium, with a prominent focal 2.4 cm soft tissue hematoma. Soft tissue swelling noted lateral and superior to the left orbit. 5. Absence of the maxillary teeth is thought to be subacute in nature, though  underlying defects are still seen. 6. Mild cortical volume loss and scattered small vessel ischemic microangiopathy. 7. Diffuse degenerative change along the cervical spine. 8. Scattered calcification at the carotid bifurcations bilaterally. Carotid ultrasound would be helpful for further evaluation, when and as deemed clinically appropriate.  Electronically Signed: By: Roanna Raider M.D. On: 09/14/2014 01:35   Ct Maxillofacial Wo Cm  09/14/2014   ADDENDUM REPORT: 09/14/2014 01:57  ADDENDUM: Upon further evaluation, acute subarachnoid hemorrhage is noted along the left parietal lobe and minimally along the left Sylvian fissure.  Critical Value/emergent results were called by telephone at the time of interpretation on 09/14/2014 at 1:57 am to the ER physician, who verbally acknowledged these results.   Electronically Signed   By: Roanna Raider M.D.   On: 09/14/2014 01:57   09/14/2014   CLINICAL DATA:  Slipped and fell in bathroom. Hit head on side of door. Left posterior scalp laceration and left supraorbital laceration. Concern for cervical spine injury. Initial encounter.  EXAM: CT HEAD WITHOUT CONTRAST  CT MAXILLOFACIAL WITHOUT CONTRAST  CT CERVICAL SPINE WITHOUT CONTRAST  TECHNIQUE: Multidetector CT imaging of the head, cervical spine, and maxillofacial structures were performed using the standard protocol without intravenous contrast. Multiplanar CT image reconstructions of the cervical spine and maxillofacial structures were also generated.  COMPARISON:  None.  FINDINGS: CT HEAD FINDINGS  There is no evidence of acute infarction, mass lesion, or intra- or extra-axial hemorrhage on CT.  Prominence of the ventricles and sulci reflects mild cortical volume loss. Mild cerebellar atrophy is noted. Scattered periventricular white matter change likely reflects small vessel ischemic microangiopathy.  The brainstem and fourth ventricle are within normal limits. The basal ganglia are unremarkable in appearance. The  cerebral hemispheres demonstrate grossly normal gray-white differentiation. No mass effect or midline shift is seen.  There is no evidence of fracture; visualized osseous structures are unremarkable in appearance. The orbits are within normal limits. The paranasal sinuses and mastoid air cells are well-aerated. Soft tissue swelling is noted lateral and superior to the left orbit. Significant soft tissue swelling and laceration are seen at the left posterior parietal calvarium, with a prominent focal 2.4 cm soft tissue hematoma.  CT MAXILLOFACIAL FINDINGS  There is no evidence of fracture or dislocation. The maxilla and mandible appear intact. The nasal bone  is unremarkable in appearance. The visualized dentition demonstrates no acute abnormality. Absence of the maxillary teeth is thought to be subacute in nature, though underlying defects are still seen.  The orbits are intact bilaterally. The visualized paranasal sinuses and mastoid air cells are well-aerated.  Soft tissue swelling is noted lateral and superior to the left orbit. The parapharyngeal fat planes are preserved. The nasopharynx, oropharynx and hypopharynx are unremarkable in appearance. The visualized portions of the valleculae and piriform sinuses are grossly unremarkable.  The parotid and submandibular glands are within normal limits. No cervical lymphadenopathy is seen.  CT CERVICAL SPINE FINDINGS  There is no evidence of acute fracture or subluxation. There is mild grade 1 anterolisthesis of C2 on C3, and diffuse disc space narrowing along the cervical spine, with scattered anterior and posterior disc osteophyte complexes, and underlying facet disease. There is suggestion of mild grade 1 anterolisthesis of C7 on T1. Vertebral bodies demonstrate normal height.  The thyroid gland is unremarkable in appearance. The visualized lung apices are clear. Retropharyngeal common carotid arteries are seen. Scattered calcification is noted at the carotid  bifurcations bilaterally.  IMPRESSION: 1. No evidence of traumatic intracranial injury or fracture. 2. No evidence of fracture or dislocation with regard to the maxillofacial structures. 3. No evidence of acute fracture or subluxation along the cervical spine. 4. Significant soft tissue swelling and laceration at the left posterior parietal calvarium, with a prominent focal 2.4 cm soft tissue hematoma. Soft tissue swelling noted lateral and superior to the left orbit. 5. Absence of the maxillary teeth is thought to be subacute in nature, though underlying defects are still seen. 6. Mild cortical volume loss and scattered small vessel ischemic microangiopathy. 7. Diffuse degenerative change along the cervical spine. 8. Scattered calcification at the carotid bifurcations bilaterally. Carotid ultrasound would be helpful for further evaluation, when and as deemed clinically appropriate.  Electronically Signed: By: Roanna Raider M.D. On: 09/14/2014 01:35   I have personally reviewed and evaluated these images and lab results as part of my medical decision-making.   EKG Interpretation   Date/Time:  Friday September 13 2014 23:55:34 EDT Ventricular Rate:  82 PR Interval:    QRS Duration: 90 QT Interval:  395 QTC Calculation: 461 R Axis:   61 Text Interpretation:  Atrial fibrillation No significant change since last  tracing Confirmed by Erroll Luna [phone]) on 09/14/2014 12:13:58  AM      MDM   Final diagnoses:  None   Patient presents to the emergency department after a fall. Her history is consistent with a mechanical fall as she slipped over a wet area. Will obtain CT scan of her head, neck, and face to evaluate for deep injury. Tetanus shot was updated.  CT scan of the head reveals left-sided subarachnoid hemorrhage. I spoke with Dr. Bevely Palmer with neurosurgery who recommends to admit the patient to the hospitalist for observation.  [facility] has been paged. Laboratory studies have now been  ordered and are pending.   LACERATION REPAIR Performed by: Tomasita Crumble Authorized byTomasita Crumble Consent: Verbal consent obtained. Risks and benefits: risks, benefits and alternatives were discussed Consent given by: patient Patient identity confirmed: provided demographic data Prepped and Draped in normal sterile fashion Wound explored  Laceration Location: L supraorbital area  Laceration Length: 1 cm  No Foreign Bodies seen or palpated  Irrigation method: syringe Amount of cleaning: standard  Skin closure: Dermabond   Patient tolerance: Patient tolerated the procedure well with no immediate complications.  CRITICAL CARE Performed by: Tomasita Crumble   Total critical care time: . [facility]  Critical care time was exclusive of separately billable procedures and treating other patients.  Critical care was necessary to treat or prevent imminent or life-threatening deterioration.  Critical care was time spent personally by me on the following activities: development of treatment plan with patient and/or surrogate as well as nursing, discussions with consultants, evaluation of patient's response to treatment, examination of patient, obtaining history from patient or surrogate, ordering and performing treatments and interventions, ordering and review of laboratory studies, ordering and review of radiographic studies, pulse oximetry and re-evaluation of patient's condition.   I personally performed the services described in this documentation, which was scribed in my presence. The recorded information has been reviewed and is accurate.    Tomasita Crumble, MD 09/14/14 [phone]

## 2014-09-13 NOTE — ED Notes (Signed)
Per EMS pt was going to the bathroom and slipped on urine and fell backwards hitting head on corner of door; Pt denies passing out; Pt c/o pain at 9/10 on left backside of head; Pt has hematoma to to left side of head and minimum abrasions to face; Pt has hx afib with prio blood thinners. Pt a&ox 4 on arrival; Md at bedside on arrival

## 2014-09-14 ENCOUNTER — Emergency Department (HOSPITAL_COMMUNITY): Payer: Medicare Other

## 2014-09-14 ENCOUNTER — Encounter (HOSPITAL_COMMUNITY): Payer: Self-pay | Admitting: Internal Medicine

## 2014-09-14 ENCOUNTER — Observation Stay (HOSPITAL_COMMUNITY): Payer: Medicare Other

## 2014-09-14 DIAGNOSIS — Z79899 Other long term (current) drug therapy: Secondary | ICD-10-CM | POA: Diagnosis not present

## 2014-09-14 DIAGNOSIS — S01112A Laceration without foreign body of left eyelid and periocular area, initial encounter: Secondary | ICD-10-CM | POA: Diagnosis not present

## 2014-09-14 DIAGNOSIS — S066X4S Traumatic subarachnoid hemorrhage with loss of consciousness of 6 hours to 24 hours, sequela: Secondary | ICD-10-CM | POA: Diagnosis not present

## 2014-09-14 DIAGNOSIS — Z981 Arthrodesis status: Secondary | ICD-10-CM | POA: Diagnosis not present

## 2014-09-14 DIAGNOSIS — W01198A Fall on same level from slipping, tripping and stumbling with subsequent striking against other object, initial encounter: Secondary | ICD-10-CM | POA: Diagnosis present

## 2014-09-14 DIAGNOSIS — I480 Paroxysmal atrial fibrillation: Secondary | ICD-10-CM

## 2014-09-14 DIAGNOSIS — D509 Iron deficiency anemia, unspecified: Secondary | ICD-10-CM

## 2014-09-14 DIAGNOSIS — W19XXXA Unspecified fall, initial encounter: Secondary | ICD-10-CM | POA: Diagnosis not present

## 2014-09-14 DIAGNOSIS — S066X0A Traumatic subarachnoid hemorrhage without loss of consciousness, initial encounter: Secondary | ICD-10-CM | POA: Diagnosis present

## 2014-09-14 DIAGNOSIS — Z7982 Long term (current) use of aspirin: Secondary | ICD-10-CM | POA: Diagnosis not present

## 2014-09-14 DIAGNOSIS — I609 Nontraumatic subarachnoid hemorrhage, unspecified: Secondary | ICD-10-CM

## 2014-09-14 DIAGNOSIS — Z880 Allergy status to penicillin: Secondary | ICD-10-CM | POA: Diagnosis not present

## 2014-09-14 DIAGNOSIS — Z96653 Presence of artificial knee joint, bilateral: Secondary | ICD-10-CM | POA: Diagnosis present

## 2014-09-14 DIAGNOSIS — G629 Polyneuropathy, unspecified: Secondary | ICD-10-CM | POA: Diagnosis present

## 2014-09-14 DIAGNOSIS — Z7952 Long term (current) use of systemic steroids: Secondary | ICD-10-CM | POA: Diagnosis not present

## 2014-09-14 DIAGNOSIS — I4891 Unspecified atrial fibrillation: Secondary | ICD-10-CM | POA: Insufficient documentation

## 2014-09-14 DIAGNOSIS — Z6838 Body mass index (BMI) 38.0-38.9, adult: Secondary | ICD-10-CM | POA: Diagnosis not present

## 2014-09-14 DIAGNOSIS — Z23 Encounter for immunization: Secondary | ICD-10-CM | POA: Diagnosis not present

## 2014-09-14 DIAGNOSIS — I10 Essential (primary) hypertension: Secondary | ICD-10-CM | POA: Diagnosis present

## 2014-09-14 DIAGNOSIS — Z9049 Acquired absence of other specified parts of digestive tract: Secondary | ICD-10-CM | POA: Diagnosis present

## 2014-09-14 DIAGNOSIS — E2740 Unspecified adrenocortical insufficiency: Secondary | ICD-10-CM | POA: Diagnosis present

## 2014-09-14 DIAGNOSIS — Z87891 Personal history of nicotine dependence: Secondary | ICD-10-CM | POA: Diagnosis not present

## 2014-09-14 DIAGNOSIS — E669 Obesity, unspecified: Secondary | ICD-10-CM | POA: Diagnosis present

## 2014-09-14 DIAGNOSIS — Z90710 Acquired absence of both cervix and uterus: Secondary | ICD-10-CM | POA: Diagnosis not present

## 2014-09-14 DIAGNOSIS — L4050 Arthropathic psoriasis, unspecified: Secondary | ICD-10-CM | POA: Diagnosis present

## 2014-09-14 DIAGNOSIS — Y92002 Bathroom of unspecified non-institutional (private) residence single-family (private) house as the place of occurrence of the external cause: Secondary | ICD-10-CM | POA: Diagnosis not present

## 2014-09-14 LAB — BASIC METABOLIC PANEL
ANION GAP: 11 (ref 5–15)
ANION GAP: 9 (ref 5–15)
BUN: 11 mg/dL (ref 6–20)
BUN: 11 mg/dL (ref 6–20)
CALCIUM: 9 mg/dL (ref 8.9–10.3)
CHLORIDE: 100 mmol/L — AB (ref 101–111)
CO2: 28 mmol/L (ref 22–32)
CO2: 30 mmol/L (ref 22–32)
CREATININE: 0.73 mg/dL (ref 0.44–1.00)
Calcium: 8.9 mg/dL (ref 8.9–10.3)
Chloride: 101 mmol/L (ref 101–111)
Creatinine, Ser: 0.73 mg/dL (ref 0.44–1.00)
GFR calc non Af Amer: >60 mL/min (ref >60)
GFR calc non Af Amer: >60 mL/min (ref >60)
Glucose, Bld: 105 mg/dL — ABNORMAL HIGH (ref 65–99)
Glucose, Bld: 88 mg/dL (ref 65–99)
POTASSIUM: 3.5 mmol/L (ref 3.5–5.1)
Potassium: 3.8 mmol/L (ref 3.5–5.1)
SODIUM: 140 mmol/L (ref 135–145)
Sodium: 139 mmol/L (ref 135–145)

## 2014-09-14 LAB — URINALYSIS, ROUTINE W REFLEX MICROSCOPIC
Bilirubin Urine: NEGATIVE
GLUCOSE, UA: NEGATIVE mg/dL
KETONES UR: NEGATIVE mg/dL
Nitrite: POSITIVE — AB
PROTEIN: NEGATIVE mg/dL
Specific Gravity, Urine: 1.008 (ref 1.005–1.030)
Urobilinogen, UA: 0.2 mg/dL (ref 0.0–1.0)
pH: 7 (ref 5.0–8.0)

## 2014-09-14 LAB — CBC WITH DIFFERENTIAL/PLATELET
BASOS ABS: 0 10*3/uL (ref 0.0–0.1)
Basophils Relative: 0 % (ref 0–1)
Eosinophils Absolute: 0.3 10*3/uL (ref 0.0–0.7)
Eosinophils Relative: 2 % (ref 0–5)
HEMATOCRIT: 39.9 % (ref 36.0–46.0)
HEMOGLOBIN: 13.2 g/dL (ref 12.0–15.0)
LYMPHS PCT: 15 % (ref 12–46)
Lymphs Abs: 2.6 10*3/uL (ref 0.7–4.0)
MCH: 29.5 pg (ref 26.0–34.0)
MCHC: 33.1 g/dL (ref 30.0–36.0)
MCV: 89.3 fL (ref 78.0–100.0)
MONOS PCT: 8 % (ref 3–12)
Monocytes Absolute: 1.4 10*3/uL — ABNORMAL HIGH (ref 0.1–1.0)
NEUTROS ABS: 13.1 10*3/uL — AB (ref 1.7–7.7)
NEUTROS PCT: 75 % (ref 43–77)
Platelets: 144 10*3/uL — ABNORMAL LOW (ref 150–400)
RBC: 4.47 MIL/uL (ref 3.87–5.11)
RDW: 14.5 % (ref 11.5–15.5)
WBC: 17.4 10*3/uL — ABNORMAL HIGH (ref 4.0–10.5)

## 2014-09-14 LAB — CBC
HCT: 39.7 % (ref 36.0–46.0)
HEMOGLOBIN: 12.8 g/dL (ref 12.0–15.0)
MCH: 28.4 pg (ref 26.0–34.0)
MCHC: 32.2 g/dL (ref 30.0–36.0)
MCV: 88 fL (ref 78.0–100.0)
Platelets: 288 10*3/uL (ref 150–400)
RBC: 4.51 MIL/uL (ref 3.87–5.11)
RDW: 14.5 % (ref 11.5–15.5)
WBC: 17.6 10*3/uL — AB (ref 4.0–10.5)

## 2014-09-14 LAB — PROTIME-INR
INR: 1.07 (ref 0.00–1.49)
PROTHROMBIN TIME: 14.1 s (ref 11.6–15.2)

## 2014-09-14 LAB — URINE MICROSCOPIC-ADD ON

## 2014-09-14 MED ORDER — GABAPENTIN 600 MG PO TABS
600.0000 mg | ORAL_TABLET | Freq: Every day | ORAL | Status: DC
  Administered 2014-09-14 – 2014-09-16 (×3): 600 mg via ORAL
  Filled 2014-09-14 (×3): qty 1

## 2014-09-14 MED ORDER — SODIUM CHLORIDE 0.9 % IJ SOLN
3.0000 mL | Freq: Two times a day (BID) | INTRAMUSCULAR | Status: DC
  Administered 2014-09-14 – 2014-09-16 (×4): 3 mL via INTRAVENOUS

## 2014-09-14 MED ORDER — POTASSIUM CHLORIDE CRYS ER 20 MEQ PO TBCR
40.0000 meq | EXTENDED_RELEASE_TABLET | Freq: Two times a day (BID) | ORAL | Status: DC
  Administered 2014-09-14 – 2014-09-16 (×5): 40 meq via ORAL
  Filled 2014-09-14 (×5): qty 2

## 2014-09-14 MED ORDER — HYDROCORTISONE NA SUCCINATE PF 100 MG IJ SOLR
50.0000 mg | Freq: Three times a day (TID) | INTRAMUSCULAR | Status: DC
  Administered 2014-09-14 – 2014-09-16 (×6): 50 mg via INTRAVENOUS
  Filled 2014-09-14 (×6): qty 2

## 2014-09-14 MED ORDER — METOPROLOL SUCCINATE ER 100 MG PO TB24
100.0000 mg | ORAL_TABLET | Freq: Every day | ORAL | Status: DC
  Administered 2014-09-14 – 2014-09-16 (×3): 100 mg via ORAL
  Filled 2014-09-14 (×3): qty 1

## 2014-09-14 MED ORDER — FUROSEMIDE 80 MG PO TABS
80.0000 mg | ORAL_TABLET | Freq: Every day | ORAL | Status: DC
Start: 2014-09-14 — End: 2014-09-16
  Administered 2014-09-14 – 2014-09-16 (×3): 80 mg via ORAL
  Filled 2014-09-14 (×3): qty 1

## 2014-09-14 MED ORDER — SODIUM CHLORIDE 0.9 % IV SOLN
INTRAVENOUS | Status: AC
  Administered 2014-09-14: 04:00:00 via INTRAVENOUS

## 2014-09-14 MED ORDER — ONDANSETRON HCL 4 MG PO TABS: 4.0000 mg | ORAL_TABLET | Freq: Four times a day (QID) | ORAL | Status: DC | PRN

## 2014-09-14 MED ORDER — ALUM & MAG HYDROXIDE-SIMETH 200-200-20 MG/5ML PO SUSP: 30.0000 mL | Freq: Four times a day (QID) | ORAL | Status: DC | PRN

## 2014-09-14 MED ORDER — HYDROMORPHONE HCL 1 MG/ML IJ SOLN: 0.5000 mg | INTRAMUSCULAR | Status: DC | PRN

## 2014-09-14 MED ORDER — GABAPENTIN 600 MG PO TABS: 600.0000 mg | ORAL_TABLET | Freq: Two times a day (BID) | ORAL | Status: DC

## 2014-09-14 MED ORDER — PANTOPRAZOLE SODIUM 40 MG PO TBEC
40.0000 mg | DELAYED_RELEASE_TABLET | Freq: Every day | ORAL | Status: DC
Start: 2014-09-14 — End: 2014-09-16
  Administered 2014-09-14 – 2014-09-16 (×3): 40 mg via ORAL
  Filled 2014-09-14 (×3): qty 1

## 2014-09-14 MED ORDER — OXYCODONE HCL 5 MG PO TABS
5.0000 mg | ORAL_TABLET | ORAL | Status: DC | PRN
  Administered 2014-09-14: 5 mg via ORAL
  Filled 2014-09-14: qty 1

## 2014-09-14 MED ORDER — ADULT MULTIVITAMIN W/MINERALS CH
1.0000 | ORAL_TABLET | Freq: Every day | ORAL | Status: DC
  Administered 2014-09-14 – 2014-09-16 (×3): 1 via ORAL
  Filled 2014-09-14 (×3): qty 1

## 2014-09-14 MED ORDER — DULOXETINE HCL 60 MG PO CPEP
90.0000 mg | ORAL_CAPSULE | Freq: Every day | ORAL | Status: DC
  Administered 2014-09-14 – 2014-09-16 (×3): 90 mg via ORAL
  Filled 2014-09-14 (×6): qty 1

## 2014-09-14 MED ORDER — MAGNESIUM OXIDE 400 (241.3 MG) MG PO TABS
400.0000 mg | ORAL_TABLET | Freq: Every day | ORAL | Status: DC
  Administered 2014-09-14 – 2014-09-16 (×3): 400 mg via ORAL
  Filled 2014-09-14 (×3): qty 1

## 2014-09-14 MED ORDER — ACETAMINOPHEN 650 MG RE SUPP: 650.0000 mg | Freq: Four times a day (QID) | RECTAL | Status: DC | PRN

## 2014-09-14 MED ORDER — MORPHINE SULFATE (PF) 4 MG/ML IV SOLN
4.0000 mg | Freq: Once | INTRAVENOUS | Status: AC
Start: 2014-09-14 — End: 2014-09-14
  Administered 2014-09-14: 4 mg via INTRAVENOUS
  Filled 2014-09-14: qty 1

## 2014-09-14 MED ORDER — ETANERCEPT 50 MG/ML ~~LOC~~ SOLN: 50.0000 mg | SUBCUTANEOUS | Status: DC

## 2014-09-14 MED ORDER — ACETAMINOPHEN 325 MG PO TABS
650.0000 mg | ORAL_TABLET | Freq: Four times a day (QID) | ORAL | Status: DC | PRN
  Administered 2014-09-15: 650 mg via ORAL
  Filled 2014-09-14: qty 2

## 2014-09-14 MED ORDER — GABAPENTIN 600 MG PO TABS
1200.0000 mg | ORAL_TABLET | Freq: Every day | ORAL | Status: DC
  Administered 2014-09-14 – 2014-09-15 (×2): 1200 mg via ORAL
  Filled 2014-09-14 (×2): qty 2

## 2014-09-14 MED ORDER — PREDNISONE 5 MG PO TABS
7.5000 mg | ORAL_TABLET | Freq: Every day | ORAL | Status: DC
Start: 2014-09-17 — End: 2014-09-16

## 2014-09-14 MED ORDER — PREDNISONE 5 MG PO TABS: 7.5000 mg | ORAL_TABLET | Freq: Every day | ORAL | Status: DC

## 2014-09-14 MED ORDER — DILTIAZEM HCL ER COATED BEADS 180 MG PO CP24
180.0000 mg | ORAL_CAPSULE | Freq: Every day | ORAL | Status: DC
  Administered 2014-09-14 – 2014-09-16 (×3): 180 mg via ORAL
  Filled 2014-09-14 (×3): qty 1

## 2014-09-14 MED ORDER — HYDROCORTISONE NA SUCCINATE PF 100 MG IJ SOLR
100.0000 mg | Freq: Three times a day (TID) | INTRAMUSCULAR | Status: DC
  Administered 2014-09-14: 100 mg via INTRAVENOUS
  Filled 2014-09-14: qty 2

## 2014-09-14 MED ORDER — ONDANSETRON HCL 4 MG/2ML IJ SOLN
4.0000 mg | Freq: Four times a day (QID) | INTRAMUSCULAR | Status: DC | PRN
  Administered 2014-09-14: 4 mg via INTRAVENOUS
  Filled 2014-09-14: qty 2

## 2014-09-14 NOTE — Evaluation (Signed)
Physical Therapy Evaluation Patient Details Name: Andrea Yu MRN: [id number] DOB: 25-May-1940 Today's Date: 09/14/2014   History of Present Illness  Andrea Yu is a 74 y.o. female with a history of Atrial Fibrillation ( not on Anticoagulant Rx due to Hx of GI Bleeding), HTN, Anemia, and Psoriatic Arthritis who presents to the ED after falling in her home and hitting her head. She reports that she got up to go to the Bathroom and did not make it there and slipped on her urine and fell back hitting her head on the door. She denies any loss of consciousness. She was evaluated in the ED and a CT scan of the head revealed a Subarachnoid Hemorrhage of the left parietal lobe  Clinical Impression  Pt admitted with above diagnosis. Pt currently with functional limitations due to the deficits listed below (see PT Problem List). Pt with dizziness and nausea limiting mobility at this time and will likely continue to limit her as she heals. Currently requiring mod A for safe mobility. Would benefit from OT eval as well as CIR consult.  Pt will benefit from skilled PT to increase their independence and safety with mobility to allow discharge to the venue listed below.       Follow Up Recommendations CIR    Equipment Recommendations  None recommended by PT    Recommendations for Other Services OT consult     Precautions / Restrictions Precautions Precautions: Fall Restrictions Weight Bearing Restrictions: No      Mobility  Bed Mobility Overal bed mobility: Needs Assistance Bed Mobility: Sit to Supine;Supine to Sit     Supine to sit: Mod assist Sit to supine: Mod assist   General bed mobility comments: pt needs assistance getting legs out and especially back into bed  Transfers Overall transfer level: Needs assistance Equipment used: 2 person hand held assist Transfers: Sit to/from Stand Sit to Stand: +2 safety/equipment;Mod assist         General transfer comment: pt very  fearful of falling and is getting dizzy and nauseous with activity. RN present and gave zophran.   Ambulation/Gait Ambulation/Gait assistance: Mod assist;+2 safety/equipment Ambulation Distance (Feet): 3 Feet Assistive device: 2 person hand held assist Gait Pattern/deviations: Step-to pattern Gait velocity: very slow Gait velocity interpretation: <1.8 ft/sec, indicative of risk for recurrent falls General Gait Details: ambulation limited by dizziness and nausea. Bilateral HHA given. Mod A for balance and because pt painful with full wt on right LE due to bursitis.   Stairs            Wheelchair Mobility    Modified Rankin (Stroke Patients Only)       Balance Overall balance assessment: Needs assistance;History of Falls Sitting-balance support: Feet supported Sitting balance-Leahy Scale: Poor Sitting balance - Comments: pt dizzy EOB, min A to maintain sitting balance   Standing balance support: Bilateral upper extremity supported Standing balance-Leahy Scale: Poor Standing balance comment: very unsteady, currently needing bilateral UE support to maintain standing                             Pertinent Vitals/Pain Pain Assessment: Faces Faces Pain Scale: Hurts little more Pain Location: right hip Pain Intervention(s): Repositioned    Home Living Family/patient expects to be discharged to:: Private residence Living Arrangements: Spouse/significant other Available Help at Discharge: Family;Available 24 hours/day Type of Home: House Home Access: Ramped entrance     Home Layout: One level Home  Equipment: Art gallery manager;Wheelchair - manual;Bedside commode;Walker - 2 wheels;Cane - single point Additional Comments: uses RW, has had several falls, currently has right hip bursitis. Daughter checks on her and helps out as needed    Prior Function Level of Independence: Needs assistance   Gait / Transfers Assistance Needed: mod I with RW  ADL's / Homemaking  Assistance Needed: family assists with bathing and dressing        Hand Dominance        Extremity/Trunk Assessment   Upper Extremity Assessment: Defer to OT evaluation           Lower Extremity Assessment: RLE deficits/detail;LLE deficits/detail;Generalized weakness RLE Deficits / Details: knee flex/ ext 3-/5, hip flex 2/5. right hip discomfort from bursitis LLE Deficits / Details: knee flex/ ext 4-/5, hip flex 3-/5  Cervical / Trunk Assessment: Kyphotic  Communication   Communication: No difficulties  Cognition Arousal/Alertness: Awake/alert Behavior During Therapy: WFL for tasks assessed/performed Overall Cognitive Status: Within Functional Limits for tasks assessed                      General Comments General comments (skin integrity, edema, etc.): RN had pt in bathroom before my arrival so get blood out of her hair and reports that pt really struggled stepping over threshold of shower. Dizziness and nausea began when up at that time.     Exercises        Assessment/Plan    PT Assessment Patient needs continued PT services  PT Diagnosis Difficulty walking;Abnormality of gait;Acute pain   PT Problem List Decreased strength;Decreased activity tolerance;Decreased balance;Decreased mobility;Decreased coordination;Pain  PT Treatment Interventions DME instruction;Gait training;Functional mobility training;Therapeutic activities;Therapeutic exercise;Balance training;Patient/family education   PT Goals (Current goals can be found in the Care Plan section) Acute Rehab PT Goals Patient Stated Goal: feel better PT Goal Formulation: With patient Time For Goal Achievement: 09/28/14 Potential to Achieve Goals: Good    Frequency Min 3X/week   Barriers to discharge        Co-evaluation               End of Session   Activity Tolerance: Other (comment) (limited by dizziness and nausea) Patient left: in bed;with bed alarm set;with call bell/phone within  reach Nurse Communication: Mobility status    Functional Assessment Tool Used: clinical judgement Functional Limitation: Mobility: Walking and moving around Mobility: Walking and Moving Around Current Status ([id number]): At least 40 percent but less than 60 percent impaired, limited or restricted Mobility: Walking and Moving Around Goal Status [phone]): At least 1 percent but less than 20 percent impaired, limited or restricted    Time: 1150-1208 PT Time Calculation (min) (ACUTE ONLY): 18 min   Charges:   PT Evaluation $Initial PT Evaluation Tier I: 1 Procedure     PT G Codes:   PT G-Codes **NOT FOR INPATIENT CLASS** Functional Assessment Tool Used: clinical judgement Functional Limitation: Mobility: Walking and moving around Mobility: Walking and Moving Around Current Status (U9811): At least 40 percent but less than 60 percent impaired, limited or restricted Mobility: Walking and Moving Around Goal Status [phone]): At least 1 percent but less than 20 percent impaired, limited or restricted   Lyanne Co, PT  Acute Rehab Services  [phone]  Lyanne Co 09/14/2014, 12:28 PM

## 2014-09-14 NOTE — Progress Notes (Signed)
Patient up to bathe, upon getting patient back to bed, she stated that the room was spinning, patient sat up with PT, patient stated she felt that she was going to vomit. Zofran administered, MD made aware. Stat CT ordered. Patient is now back in bed, neuro assessment unchanged.

## 2014-09-14 NOTE — Progress Notes (Signed)
Pt transferred to unit from [facility] ED via NT x 1. Pt alert and oriented upon arrival. Only complaint is of pain to the back of the head. No signs or symptoms of acute distress. Pt connected to telemetry and central monitoring notified. Pt oriented to unit as well unit procedures. Pt now resting in bed at lowest position, bed alarm on, call light in reach. Will continue to monitor. Delfino Lovett, RN, BSN 09/14/2014 4:42 AM

## 2014-09-14 NOTE — H&P (Addendum)
[facility] Admission History and Physical       Andrea Yu:[id number] DOB: 14-Jul-1940 DOA: 09/13/2014  Referring physician: EDP PCP: Garlan Fillers, MD  Specialists:   Chief Complaint: Larey Seat and Hit Head  HPI: Andrea Yu is a 74 y.o. female with a history of Atrial Fibrillation ( not on Anticoagulant Rx due to Hx of GI Bleeding), HTN, Anemia, and Psoriatic Arthritis who presents to the ED after falling in her home and hitting her head. She reports that she got up to go to the  Bathroom and did not make it there and slipped on her urine and fell back hitting her head on the door.   She denies any loss of consciousness.   She was evaluated in the ED and a CT scan of the head revealed a Subarachnoid Hemorrhage of the left parietal lobe.   Neurosurgery Dr Bevely Palmer was contacted by the EDP Dr. Mora Bellman and is to see the patient this AM.  She was referred for Observation.     Review of Systems:   Constitutional: No Weight Loss, No Weight Gain, Night Sweats, Fevers, Chills, Dizziness, Light Headedness, Fatigue, or Generalized Weakness HEENT: +Headache, Difficulty Swallowing,Tooth/Dental Problems,Sore Throat,  No Sneezing, Rhinitis, Ear Ache, Nasal Congestion, or Post Nasal Drip,  Cardio-vascular:  No Chest pain, Orthopnea, PND, Edema in Lower Extremities, Anasarca, Dizziness, Palpitations  Resp: No Dyspnea, No DOE, No Productive Cough, No Non-Productive Cough, No Hemoptysis, No Wheezing.    GI: No Heartburn, Indigestion, Abdominal Pain, Nausea, Vomiting, Diarrhea, Constipation, Hematemesis, Hematochezia, Melena, Change in Bowel Habits,  Loss of Appetite  GU: No Dysuria, No Change in Color of Urine, No Urgency or Urinary Frequency, No Flank pain.  Musculoskeletal: No Joint Pain or Swelling, No Decreased Range of Motion, No Back Pain.  Neurologic: No Syncope, No Seizures, Muscle Weakness, Paresthesia, Vision Disturbance or Loss, No Diplopia, No Vertigo, No Difficulty Walking,  Skin: No  Rash or Lesions. Psych: No Change in Mood or Affect, No Depression or Anxiety, No Memory loss, No Confusion, or Hallucinations   Past Medical History  Diagnosis Date  . Hypertension   . Peripheral neuropathy     "from back OR in 2005; in hands and feet" (11/07/2012)  . Anemia   . History of blood transfusion     "after 1 of my knee ORs and today" (11/07/2012)  . A-fib     "just after knee 2nd OR" (11/07/2012)  . 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 Back surgery    . Replacement total knee bilateral Bilateral 2007  . Cholecystectomy  2000's  . Joint replacement    . Vaginal hysterectomy  1983  . Tubal ligation  1974  . Cataract extraction w/ intraocular lens  implant, bilateral Bilateral 2007  . Posterior lumbar fusion  2005    "got a rod and 4 pins in there" (11/07/2012)  . Colonoscopy N/A 11/10/2012    Procedure: COLONOSCOPY;  Surgeon: Theda Belfast, MD;  Location: [facility] ENDOSCOPY;  Service: Endoscopy;  Laterality: N/A;  . Esophagogastroduodenoscopy N/A 11/10/2012    Procedure: ESOPHAGOGASTRODUODENOSCOPY (EGD);  Surgeon: Theda Belfast, MD;  Location: [facility] ENDOSCOPY;  Service: Endoscopy;  Laterality: N/A;  . Transthoracic echocardiogram  05/24/2006      Prior to Admission medications   Medication Sig Start Date End Date Taking? Authorizing Provider  acetaminophen (TYLENOL) 325 MG tablet Take 2  tablets (650 mg total) by mouth every 4 (four) hours as needed. Patient taking differently: Take 650 mg by mouth every 4 (four) hours as needed for mild pain.  11/11/12  Yes Christiane Ha, MD  aspirin EC 81 MG tablet Take 1 tablet (81 mg total) by mouth daily. 11/21/12  Yes Mihai Croitoru, MD  BIOTIN 5000 PO Take 5,000 mg by mouth at bedtime.    Yes Historical Provider, MD  diltiazem (CARDIZEM CD) 180 MG 24 hr capsule Take 1 capsule (180 mg total) by mouth daily. 11/11/12  Yes Christiane Ha, MD  DULoxetine (CYMBALTA) 30 MG capsule Take along with 60 mg. Total 90 mg 12/14/13  Yes Historical Provider, MD  DULoxetine (CYMBALTA) 60 MG capsule Take 60 mg by mouth daily. Take along with 30mg  capsule to make a total of 90mg    Yes Historical Provider, MD  etanercept (ENBREL) 50 MG/ML injection Inject 50 mg into the skin once a week. Tuesday   Yes Historical Provider, MD  furosemide (LASIX) 40 MG tablet Take 80 mg by mouth daily.    Yes Historical Provider, MD  gabapentin (NEURONTIN) 600 MG tablet Take 600-1,200 mg by mouth 2 (two) times daily. 1 tablet in morning and 2 tablets at night   Yes Historical Provider, MD  HYDROcodone-acetaminophen (NORCO/VICODIN) 5-325 MG per tablet Take 1 tablet by mouth every 6 (six) hours as needed for pain.   Yes Historical Provider, MD  Magnesium 250 MG TABS Take 1 tablet by mouth daily.   Yes Historical Provider, MD  metoprolol succinate (TOPROL-XL) 100 MG 24 hr tablet Take 1 tablet (100 mg total) by mouth daily. Take with or immediately following a meal. 02/01/14  Yes Mihai Croitoru, MD  Multiple Vitamin (MULTIVITAMIN WITH MINERALS) TABS Take 1 tablet by mouth daily.   Yes Historical Provider, MD  omeprazole (PRILOSEC) 40 MG capsule Take 1 capsule (40 mg total) by mouth daily. 11/11/12  Yes 04/02/14, MD  potassium chloride SA (K-DUR,KLOR-CON) 20 MEQ tablet Take 40 mEq by mouth 2 (two) times daily.    Yes Historical Provider, MD  predniSONE (DELTASONE) 5 MG tablet Take 7.5 mg by mouth daily.  08/12/12  Yes Rich Tisovec, MD  PROAIR HFA 108 (90 BASE) MCG/ACT inhaler Inhale 1-2 puffs into the lungs every 6 (six) hours as needed for wheezing or shortness of breath.  02/06/13  Yes Historical Provider, MD  Vitamin D, Ergocalciferol, (DRISDOL) 50000 UNITS CAPS Take 50,000 Units by mouth every 7 (seven) days. Tuesday   Yes Historical Provider, MD     Allergies  Allergen Reactions  . Penicillins Other (See Comments)    Throat swelling     Social  History:  reports that she quit smoking about 33 years ago. Her smoking use included Cigarettes. She has a 15 pack-year smoking history. She has never used smokeless tobacco. She reports that she does not drink alcohol or use illicit drugs.    Family History  Problem Relation Age of Onset  . Heart attack Father     1951-deceased   . Aneurysm Son 85    Deceased   . Liver disease Mother        Physical Exam:  GEN:  Pleasant Obese  74 y.o. Caucasian female examined and in no acute distress; cooperative with exam Filed Vitals:   09/14/14 0000 09/14/14 0030 09/14/14 0130 09/14/14 0200  BP: 114/49 150/65 141/72 116/74  Pulse: 90 84 87 75  Temp:      TempSrc:  Resp: 19 13 23 21   Height:      Weight:      SpO2: 98% 99% 98% 98%   Blood pressure 116/74, pulse 75, temperature 98.5 F (36.9 C), temperature source Oral, resp. rate 21, height 4\' 11"  (1.499 m), weight 83.008 kg (183 lb), SpO2 98 %. PSYCH: She is alert and oriented x4; does not appear anxious does not appear depressed; affect is normal HEENT: Normocephalic  And + laceration above Left Eyebrow and Periorbital Ecchymosis, Mucous membranes pink; PERRLA; EOM intact; Fundi:  Benign;  No scleral icterus, Nares: Patent, Oropharynx: Clear, Fair Dentition,    Neck:  FROM, No Cervical Lymphadenopathy nor Thyromegaly or Carotid Bruit; No JVD; Breasts:: Not examined CHEST WALL: No tenderness CHEST: Normal respiration, clear to auscultation bilaterally HEART: Regular rate and rhythm; no murmurs rubs or gallops BACK: No kyphosis or scoliosis; No CVA tenderness ABDOMEN: Positive Bowel Sounds, Obese, Soft Non-Tender, No Rebound or Guarding; No Masses, No Organomegaly. Rectal Exam: Not done EXTREMITIES: No Cyanosis, Clubbing, or Edema; No Ulcerations. Genitalia: not examined PULSES: 2+ and symmetric SKIN: Normal hydration no rash or ulceration CNS:  Alert and Oriented x 4, No Focal Deficits Vascular: pulses palpable throughout     Labs on Admission:  Basic Metabolic Panel: No results for input(s): NA, K, CL, CO2, GLUCOSE, BUN, CREATININE, CALCIUM, MG, PHOS in the last 168 hours. Liver Function Tests: No results for input(s): AST, ALT, ALKPHOS, BILITOT, PROT, ALBUMIN in the last 168 hours. No results for input(s): LIPASE, AMYLASE in the last 168 hours. No results for input(s): AMMONIA in the last 168 hours. CBC: No results for input(s): WBC, NEUTROABS, HGB, HCT, MCV, PLT in the last 168 hours. Cardiac Enzymes: No results for input(s): CKTOTAL, CKMB, CKMBINDEX, TROPONINI in the last 168 hours.  BNP (last 3 results) No results for input(s): BNP in the last 8760 hours.  ProBNP (last 3 results) No results for input(s): PROBNP in the last 8760 hours.  CBG: No results for input(s): GLUCAP in the last 168 hours.  Radiological Exams on Admission: Ct Head Wo Contrast  09/14/2014   ADDENDUM REPORT: 09/14/2014 01:57  ADDENDUM: Upon further evaluation, acute subarachnoid hemorrhage is noted along the left parietal lobe and minimally along the left Sylvian fissure.  Critical Value/emergent results were called by telephone at the time of interpretation on 09/14/2014 at 1:57 am to the ER physician, who verbally acknowledged these results.   Electronically Signed   By: Roanna Raider M.D.   On: 09/14/2014 01:57   09/14/2014   CLINICAL DATA:  Slipped and fell in bathroom. Hit head on side of door. Left posterior scalp laceration and left supraorbital laceration. Concern for cervical spine injury. Initial encounter.  EXAM: CT HEAD WITHOUT CONTRAST  CT MAXILLOFACIAL WITHOUT CONTRAST  CT CERVICAL SPINE WITHOUT CONTRAST  TECHNIQUE: Multidetector CT imaging of the head, cervical spine, and maxillofacial structures were performed using the standard protocol without intravenous contrast. Multiplanar CT image reconstructions of the cervical spine and maxillofacial structures were also generated.  COMPARISON:  None.  FINDINGS: CT HEAD  FINDINGS  There is no evidence of acute infarction, mass lesion, or intra- or extra-axial hemorrhage on CT.  Prominence of the ventricles and sulci reflects mild cortical volume loss. Mild cerebellar atrophy is noted. Scattered periventricular white matter change likely reflects small vessel ischemic microangiopathy.  The brainstem and fourth ventricle are within normal limits. The basal ganglia are unremarkable in appearance. The cerebral hemispheres demonstrate grossly normal gray-white differentiation. No mass effect  or midline shift is seen.  There is no evidence of fracture; visualized osseous structures are unremarkable in appearance. The orbits are within normal limits. The paranasal sinuses and mastoid air cells are well-aerated. Soft tissue swelling is noted lateral and superior to the left orbit. Significant soft tissue swelling and laceration are seen at the left posterior parietal calvarium, with a prominent focal 2.4 cm soft tissue hematoma.  CT MAXILLOFACIAL FINDINGS  There is no evidence of fracture or dislocation. The maxilla and mandible appear intact. The nasal bone is unremarkable in appearance. The visualized dentition demonstrates no acute abnormality. Absence of the maxillary teeth is thought to be subacute in nature, though underlying defects are still seen.  The orbits are intact bilaterally. The visualized paranasal sinuses and mastoid air cells are well-aerated.  Soft tissue swelling is noted lateral and superior to the left orbit. The parapharyngeal fat planes are preserved. The nasopharynx, oropharynx and hypopharynx are unremarkable in appearance. The visualized portions of the valleculae and piriform sinuses are grossly unremarkable.  The parotid and submandibular glands are within normal limits. No cervical lymphadenopathy is seen.  CT CERVICAL SPINE FINDINGS  There is no evidence of acute fracture or subluxation. There is mild grade 1 anterolisthesis of C2 on C3, and diffuse disc  space narrowing along the cervical spine, with scattered anterior and posterior disc osteophyte complexes, and underlying facet disease. There is suggestion of mild grade 1 anterolisthesis of C7 on T1. Vertebral bodies demonstrate normal height.  The thyroid gland is unremarkable in appearance. The visualized lung apices are clear. Retropharyngeal common carotid arteries are seen. Scattered calcification is noted at the carotid bifurcations bilaterally.  IMPRESSION: 1. No evidence of traumatic intracranial injury or fracture. 2. No evidence of fracture or dislocation with regard to the maxillofacial structures. 3. No evidence of acute fracture or subluxation along the cervical spine. 4. Significant soft tissue swelling and laceration at the left posterior parietal calvarium, with a prominent focal 2.4 cm soft tissue hematoma. Soft tissue swelling noted lateral and superior to the left orbit. 5. Absence of the maxillary teeth is thought to be subacute in nature, though underlying defects are still seen. 6. Mild cortical volume loss and scattered small vessel ischemic microangiopathy. 7. Diffuse degenerative change along the cervical spine. 8. Scattered calcification at the carotid bifurcations bilaterally. Carotid ultrasound would be helpful for further evaluation, when and as deemed clinically appropriate.  Electronically Signed: By: Roanna Raider M.D. On: 09/14/2014 01:35   Ct Cervical Spine Wo Contrast  09/14/2014   ADDENDUM REPORT: 09/14/2014 01:57  ADDENDUM: Upon further evaluation, acute subarachnoid hemorrhage is noted along the left parietal lobe and minimally along the left Sylvian fissure.  Critical Value/emergent results were called by telephone at the time of interpretation on 09/14/2014 at 1:57 am to the ER physician, who verbally acknowledged these results.   Electronically Signed   By: Roanna Raider M.D.   On: 09/14/2014 01:57   09/14/2014   CLINICAL DATA:  Slipped and fell in bathroom. Hit head  on side of door. Left posterior scalp laceration and left supraorbital laceration. Concern for cervical spine injury. Initial encounter.  EXAM: CT HEAD WITHOUT CONTRAST  CT MAXILLOFACIAL WITHOUT CONTRAST  CT CERVICAL SPINE WITHOUT CONTRAST  TECHNIQUE: Multidetector CT imaging of the head, cervical spine, and maxillofacial structures were performed using the standard protocol without intravenous contrast. Multiplanar CT image reconstructions of the cervical spine and maxillofacial structures were also generated.  COMPARISON:  None.  FINDINGS: CT HEAD  FINDINGS  There is no evidence of acute infarction, mass lesion, or intra- or extra-axial hemorrhage on CT.  Prominence of the ventricles and sulci reflects mild cortical volume loss. Mild cerebellar atrophy is noted. Scattered periventricular white matter change likely reflects small vessel ischemic microangiopathy.  The brainstem and fourth ventricle are within normal limits. The basal ganglia are unremarkable in appearance. The cerebral hemispheres demonstrate grossly normal gray-white differentiation. No mass effect or midline shift is seen.  There is no evidence of fracture; visualized osseous structures are unremarkable in appearance. The orbits are within normal limits. The paranasal sinuses and mastoid air cells are well-aerated. Soft tissue swelling is noted lateral and superior to the left orbit. Significant soft tissue swelling and laceration are seen at the left posterior parietal calvarium, with a prominent focal 2.4 cm soft tissue hematoma.  CT MAXILLOFACIAL FINDINGS  There is no evidence of fracture or dislocation. The maxilla and mandible appear intact. The nasal bone is unremarkable in appearance. The visualized dentition demonstrates no acute abnormality. Absence of the maxillary teeth is thought to be subacute in nature, though underlying defects are still seen.  The orbits are intact bilaterally. The visualized paranasal sinuses and mastoid air cells  are well-aerated.  Soft tissue swelling is noted lateral and superior to the left orbit. The parapharyngeal fat planes are preserved. The nasopharynx, oropharynx and hypopharynx are unremarkable in appearance. The visualized portions of the valleculae and piriform sinuses are grossly unremarkable.  The parotid and submandibular glands are within normal limits. No cervical lymphadenopathy is seen.  CT CERVICAL SPINE FINDINGS  There is no evidence of acute fracture or subluxation. There is mild grade 1 anterolisthesis of C2 on C3, and diffuse disc space narrowing along the cervical spine, with scattered anterior and posterior disc osteophyte complexes, and underlying facet disease. There is suggestion of mild grade 1 anterolisthesis of C7 on T1. Vertebral bodies demonstrate normal height.  The thyroid gland is unremarkable in appearance. The visualized lung apices are clear. Retropharyngeal common carotid arteries are seen. Scattered calcification is noted at the carotid bifurcations bilaterally.  IMPRESSION: 1. No evidence of traumatic intracranial injury or fracture. 2. No evidence of fracture or dislocation with regard to the maxillofacial structures. 3. No evidence of acute fracture or subluxation along the cervical spine. 4. Significant soft tissue swelling and laceration at the left posterior parietal calvarium, with a prominent focal 2.4 cm soft tissue hematoma. Soft tissue swelling noted lateral and superior to the left orbit. 5. Absence of the maxillary teeth is thought to be subacute in nature, though underlying defects are still seen. 6. Mild cortical volume loss and scattered small vessel ischemic microangiopathy. 7. Diffuse degenerative change along the cervical spine. 8. Scattered calcification at the carotid bifurcations bilaterally. Carotid ultrasound would be helpful for further evaluation, when and as deemed clinically appropriate.  Electronically Signed: By: Roanna Raider M.D. On: 09/14/2014 01:35    Ct Maxillofacial Wo Cm  09/14/2014   ADDENDUM REPORT: 09/14/2014 01:57  ADDENDUM: Upon further evaluation, acute subarachnoid hemorrhage is noted along the left parietal lobe and minimally along the left Sylvian fissure.  Critical Value/emergent results were called by telephone at the time of interpretation on 09/14/2014 at 1:57 am to the ER physician, who verbally acknowledged these results.   Electronically Signed   By: Roanna Raider M.D.   On: 09/14/2014 01:57   09/14/2014   CLINICAL DATA:  Slipped and fell in bathroom. Hit head on side of door. Left posterior scalp  laceration and left supraorbital laceration. Concern for cervical spine injury. Initial encounter.  EXAM: CT HEAD WITHOUT CONTRAST  CT MAXILLOFACIAL WITHOUT CONTRAST  CT CERVICAL SPINE WITHOUT CONTRAST  TECHNIQUE: Multidetector CT imaging of the head, cervical spine, and maxillofacial structures were performed using the standard protocol without intravenous contrast. Multiplanar CT image reconstructions of the cervical spine and maxillofacial structures were also generated.  COMPARISON:  None.  FINDINGS: CT HEAD FINDINGS  There is no evidence of acute infarction, mass lesion, or intra- or extra-axial hemorrhage on CT.  Prominence of the ventricles and sulci reflects mild cortical volume loss. Mild cerebellar atrophy is noted. Scattered periventricular white matter change likely reflects small vessel ischemic microangiopathy.  The brainstem and fourth ventricle are within normal limits. The basal ganglia are unremarkable in appearance. The cerebral hemispheres demonstrate grossly normal gray-white differentiation. No mass effect or midline shift is seen.  There is no evidence of fracture; visualized osseous structures are unremarkable in appearance. The orbits are within normal limits. The paranasal sinuses and mastoid air cells are well-aerated. Soft tissue swelling is noted lateral and superior to the left orbit. Significant soft tissue  swelling and laceration are seen at the left posterior parietal calvarium, with a prominent focal 2.4 cm soft tissue hematoma.  CT MAXILLOFACIAL FINDINGS  There is no evidence of fracture or dislocation. The maxilla and mandible appear intact. The nasal bone is unremarkable in appearance. The visualized dentition demonstrates no acute abnormality. Absence of the maxillary teeth is thought to be subacute in nature, though underlying defects are still seen.  The orbits are intact bilaterally. The visualized paranasal sinuses and mastoid air cells are well-aerated.  Soft tissue swelling is noted lateral and superior to the left orbit. The parapharyngeal fat planes are preserved. The nasopharynx, oropharynx and hypopharynx are unremarkable in appearance. The visualized portions of the valleculae and piriform sinuses are grossly unremarkable.  The parotid and submandibular glands are within normal limits. No cervical lymphadenopathy is seen.  CT CERVICAL SPINE FINDINGS  There is no evidence of acute fracture or subluxation. There is mild grade 1 anterolisthesis of C2 on C3, and diffuse disc space narrowing along the cervical spine, with scattered anterior and posterior disc osteophyte complexes, and underlying facet disease. There is suggestion of mild grade 1 anterolisthesis of C7 on T1. Vertebral bodies demonstrate normal height.  The thyroid gland is unremarkable in appearance. The visualized lung apices are clear. Retropharyngeal common carotid arteries are seen. Scattered calcification is noted at the carotid bifurcations bilaterally.  IMPRESSION: 1. No evidence of traumatic intracranial injury or fracture. 2. No evidence of fracture or dislocation with regard to the maxillofacial structures. 3. No evidence of acute fracture or subluxation along the cervical spine. 4. Significant soft tissue swelling and laceration at the left posterior parietal calvarium, with a prominent focal 2.4 cm soft tissue hematoma. Soft  tissue swelling noted lateral and superior to the left orbit. 5. Absence of the maxillary teeth is thought to be subacute in nature, though underlying defects are still seen. 6. Mild cortical volume loss and scattered small vessel ischemic microangiopathy. 7. Diffuse degenerative change along the cervical spine. 8. Scattered calcification at the carotid bifurcations bilaterally. Carotid ultrasound would be helpful for further evaluation, when and as deemed clinically appropriate.  Electronically Signed: By: Roanna Raider M.D. On: 09/14/2014 01:35     EKG: Independently reviewed. Atrial Fibrillation  Rate =82     Assessment/Plan:     74 y.o. female with  Principal Problem:  1.    SAH (subarachnoid hemorrhage)   Telemetry Monitoring   Neuro Checks   Repeat Ct scan Head in AM   Neurosurgery consulted to see this AM   Active Problems:   2.    Fall- Mechanical in nature     3.    Laceration of eyebrow, left   Dermabond placed   Monitor     4.    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    PAF (paroxysmal atrial fibrillation)   Continue Diltiazem Rx for rate Control   Monitor on Telemetry   Not a Coumadin or Anticoagulant Candidate   Hold ASA due to #1     6.    Iron deficiency anemia- transfused this admission   On Iron Rx   Monitor trend       7.    Hypertension   Continue Diltiazem, Metoprolol, and Lasix Rx   Monitor BPs     8     DVT Prophylaxis   SCDs      Code Status:     FULL CODE     Family Communication:   Family at Bedside       Disposition Plan:   Observation Status        Time spent:  80 Minutes          Ron Parker [facility] Pager [phone]   If 7AM -7PM Please Contact the Day Rounding Team MD for [facility]  If 7PM-7AM, Please Contact Night-Floor Coverage  [email or web address] Password [email or web address] 09/14/2014, 2:49 AM      ADDENDUM:   Patient was seen and examined on 09/14/2014

## 2014-09-14 NOTE — Consult Note (Signed)
CC: Fall, head injury Chief Complaint  Patient presents with  . Fall    HPI: Andrea Yu is a 74 y.o. female who sustained a fall at home today.  She struck her head but did not lose consciousness.  She sustained a left eyebrow laceration.  She denies weakness or numbness or other neurological symptoms.  PMH: Past Medical History  Diagnosis Date  . Hypertension   . Peripheral neuropathy     "from back OR in 2005; in hands and feet" (11/07/2012)  . Anemia   . History of blood transfusion     "after 1 of my knee ORs and today" (11/07/2012)  . A-fib     "just after knee 2nd OR" (11/07/2012)  . Psoriatic arthritis     on Prednisone Rx    PSH: Past Surgical History  Procedure Laterality Date  . Back surgery    . Replacement total knee bilateral Bilateral 2007  . Cholecystectomy  2000's  . Joint replacement    . Vaginal hysterectomy  1983  . Tubal ligation  1974  . Cataract extraction w/ intraocular lens  implant, bilateral Bilateral 2007  . Posterior lumbar fusion  2005    "got a rod and 4 pins in there" (11/07/2012)  . Colonoscopy N/A 11/10/2012    Procedure: COLONOSCOPY;  Surgeon: Theda Belfast, MD;  Location: [facility] ENDOSCOPY;  Service: Endoscopy;  Laterality: N/A;  . Esophagogastroduodenoscopy N/A 11/10/2012    Procedure: ESOPHAGOGASTRODUODENOSCOPY (EGD);  Surgeon: Theda Belfast, MD;  Location: [facility] ENDOSCOPY;  Service: Endoscopy;  Laterality: N/A;  . Transthoracic echocardiogram  05/24/2006    SH: Social History  Substance Use Topics  . Smoking status: Former Smoker -- 0.50 packs/day for 30 years    Types: Cigarettes    Quit date: 08/09/1981  . Smokeless tobacco: Never Used  . Alcohol Use: No    MEDS: Prior to Admission medications   Medication Sig Start Date End Date Taking? Authorizing Provider  acetaminophen (TYLENOL) 325 MG tablet Take 2 tablets (650 mg total) by mouth every 4 (four) hours as needed. Patient taking differently: Take 650 mg by mouth every 4  (four) hours as needed for mild pain.  11/11/12  Yes Christiane Ha, MD  aspirin EC 81 MG tablet Take 1 tablet (81 mg total) by mouth daily. 11/21/12  Yes Mihai Croitoru, MD  BIOTIN 5000 PO Take 5,000 mg by mouth at bedtime.    Yes Historical Provider, MD  diltiazem (CARDIZEM CD) 180 MG 24 hr capsule Take 1 capsule (180 mg total) by mouth daily. 11/11/12  Yes Christiane Ha, MD  DULoxetine (CYMBALTA) 30 MG capsule Take along with 60 mg. Total 90 mg 12/14/13  Yes Historical Provider, MD  DULoxetine (CYMBALTA) 60 MG capsule Take 60 mg by mouth daily. Take along with 30mg  capsule to make a total of 90mg    Yes Historical Provider, MD  etanercept (ENBREL) 50 MG/ML injection Inject 50 mg into the skin once a week. Tuesday   Yes Historical Provider, MD  furosemide (LASIX) 40 MG tablet Take 80 mg by mouth daily.    Yes Historical Provider, MD  gabapentin (NEURONTIN) 600 MG tablet Take 600-1,200 mg by mouth 2 (two) times daily. 1 tablet in morning and 2 tablets at night   Yes Historical Provider, MD  HYDROcodone-acetaminophen (NORCO/VICODIN) 5-325 MG per tablet Take 1 tablet by mouth every 6 (six) hours as needed for pain.   Yes Historical Provider, MD  Magnesium 250 MG TABS Take 1  tablet by mouth daily.   Yes Historical Provider, MD  metoprolol succinate (TOPROL-XL) 100 MG 24 hr tablet Take 1 tablet (100 mg total) by mouth daily. Take with or immediately following a meal. 02/01/14  Yes Mihai Croitoru, MD  Multiple Vitamin (MULTIVITAMIN WITH MINERALS) TABS Take 1 tablet by mouth daily.   Yes Historical Provider, MD  omeprazole (PRILOSEC) 40 MG capsule Take 1 capsule (40 mg total) by mouth daily. 11/11/12  Yes Christiane Ha, MD  potassium chloride SA (K-DUR,KLOR-CON) 20 MEQ tablet Take 40 mEq by mouth 2 (two) times daily.    Yes Historical Provider, MD  predniSONE (DELTASONE) 5 MG tablet Take 7.5 mg by mouth daily.  08/12/12  Yes Rich Tisovec, MD  PROAIR HFA 108 (90 BASE) MCG/ACT inhaler Inhale 1-2  puffs into the lungs every 6 (six) hours as needed for wheezing or shortness of breath.  02/06/13  Yes Historical Provider, MD  Vitamin D, Ergocalciferol, (DRISDOL) 50000 UNITS CAPS Take 50,000 Units by mouth every 7 (seven) days. Tuesday   Yes Historical Provider, MD    ALLERGY: Allergies  Allergen Reactions  . Penicillins Other (See Comments)    Throat swelling     ROS: ROS  NEUROLOGIC EXAM: Awake, alert, oriented Memory and concentration grossly intact Speech fluent, appropriate CN grossly intact Motor exam: Upper Extremities Deltoid Bicep Tricep Grip  Right 5/5 5/5 5/5 5/5  Left 5/5 5/5 5/5 5/5   Lower Extremity IP Quad PF DF EHL  Right 5/5 5/5 5/5 5/5 5/5  Left 5/5 5/5 5/5 5/5 5/5   Sensation grossly intact to LT  IMGAING: CT Head: Left posterior sylvian subarachnoid hemorrage. CT Cervical Spine: No acute fracture or subluxation.  Multi-level degenerative changes.  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  She is neurologically well.  She does not need a repeat CT scan this morning.  PLAN: - No neurosurgical follow up required. - May be d/c'd when cleared by primary team. - Please feel free to call with questions.

## 2014-09-14 NOTE — ED Notes (Signed)
Patient transported to CT 

## 2014-09-14 NOTE — Progress Notes (Signed)
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

## 2014-09-15 DIAGNOSIS — I609 Nontraumatic subarachnoid hemorrhage, unspecified: Secondary | ICD-10-CM

## 2014-09-15 NOTE — Progress Notes (Addendum)
Physical Therapy Treatment Patient Details Name: Andrea Yu MRN: [id number] DOB: 1940/04/23 Today's Date: 09/15/2014    History of Present Illness Andrea Yu is a 74 y.o. female with a history of Atrial Fibrillation ( not on Anticoagulant Rx due to Hx of GI Bleeding), HTN, Anemia, and Psoriatic Arthritis who presents to the ED after falling in her home and hitting her head. She reports that she got up to go to the Bathroom and did not make it there and slipped on her urine and fell back hitting her head on the door. She denies any loss of consciousness. She was evaluated in the ED and a CT scan of the head revealed a Subarachnoid Hemorrhage of the left parietal lobe    PT Comments    Excellent progress noted with mobility. N&V has subsided. Pt very motivated to participate in therapy.   Follow Up Recommendations  CIR     Equipment Recommendations  None recommended by PT    Recommendations for Other Services  OT consult     Precautions / Restrictions Precautions Precautions: Fall    Mobility  Bed Mobility         Supine to sit: Min assist     General bed mobility comments: verbal cues for sequencing  Transfers   Equipment used: Rolling walker (2 wheeled)   Sit to Stand: Min assist         General transfer comment: verbal cues for hand placement  Ambulation/Gait Ambulation/Gait assistance: Min assist Ambulation Distance (Feet): 100 Feet Assistive device: Rolling walker (2 wheeled) Gait Pattern/deviations: Step-through pattern;Decreased stride length Gait velocity: mildly decreased Gait velocity interpretation: Below normal speed for age/gender General Gait Details: L knee buckling x 1 during ambulation but able to self correct and maintain balance   Stairs            Wheelchair Mobility    Modified Rankin (Stroke Patients Only)       Balance Overall balance assessment: History of Falls;Needs assistance Sitting-balance support: Feet  supported;No upper extremity supported Sitting balance-Leahy Scale: Good     Standing balance support: During functional activity;Bilateral upper extremity supported Standing balance-Leahy Scale: Fair                      Cognition Arousal/Alertness: Awake/alert Behavior During Therapy: WFL for tasks assessed/performed Overall Cognitive Status: Within Functional Limits for tasks assessed                      Exercises      General Comments        Pertinent Vitals/Pain Pain Assessment: No/denies pain    Home Living                      Prior Function            PT Goals (current goals can now be found in the care plan section) Acute Rehab PT Goals Patient Stated Goal: feel better PT Goal Formulation: With patient Time For Goal Achievement: 09/28/14 Potential to Achieve Goals: Good Progress towards PT goals: Progressing toward goals    Frequency  Min 3X/week    PT Plan Current plan remains appropriate    Co-evaluation             End of Session Equipment Utilized During Treatment: Gait belt Activity Tolerance: Patient tolerated treatment well Patient left: in chair;with call bell/phone within reach     Time: 1114-1140 PT Time  Calculation (min) (ACUTE ONLY): 26 min  Charges:  $Gait Training: 23-37 mins                    G Codes:      Ilda Foil 09/15/2014, 12:21 PM

## 2014-09-15 NOTE — Progress Notes (Signed)
[facility] PROGRESS NOTE  ALIEAH WILLMORE ZDG:[id number] DOB: May 11, 1940 DOA: 09/13/2014 PCP: Garlan Fillers, MD  Assessment/Plan:  Principal Problem:   SAH (subarachnoid hemorrhage) Active Problems:   Adrenal insufficiency   PAF (paroxysmal atrial fibrillation)   Iron deficiency anemia   Hypertension   Laceration of eyebrow, left   Fall  Await rehab eval. Repeat CT negative. Nausea resolved.  HPI/Subjective: Nausea resolved. Feels dizzy with standing  Objective: Filed Vitals:   09/15/14 1700  BP: 111/77  Pulse: 89  Temp: 98.2 F (36.8 C)  Resp: 20    Intake/Output Summary (Last 24 hours) at 09/15/14 2042 Last data filed at 09/15/14 1758  Gross per 24 hour  Intake    720 ml  Output      0 ml  Net    720 ml   Filed Weights   09/13/14 2354 09/14/14 0358  Weight: 83.008 kg (183 lb) 86.41 kg (190 lb 8 oz)    Exam:   General:  A and o  HEENT: lac left brow. Blood in hair  Cardiovascular: RRR  Respiratory: CTA  Abdomen: S, nt, nd  Ext: no CCE  Neuro: nonfocal  Basic Metabolic Panel:  Recent Labs Lab 09/14/14 0218 09/14/14 0440  NA 139 140  K 3.5 3.8  CL 100* 101  CO2 28 30  GLUCOSE 105* 88  BUN 11 11  CREATININE 0.73 0.73  CALCIUM 8.9 9.0   Liver Function Tests: No results for input(s): AST, ALT, ALKPHOS, BILITOT, PROT, ALBUMIN in the last 168 hours. No results for input(s): LIPASE, AMYLASE in the last 168 hours. No results for input(s): AMMONIA in the last 168 hours. CBC:  Recent Labs Lab 09/14/14 0218 09/14/14 0440  WBC 17.4* 17.6*  NEUTROABS 13.1*  --   HGB 13.2 12.8  HCT 39.9 39.7  MCV 89.3 88.0  PLT 144* 288   Cardiac Enzymes: No results for input(s): CKTOTAL, CKMB, CKMBINDEX, TROPONINI in the last 168 hours. BNP (last 3 results) No results for input(s): BNP in the last 8760 hours.  ProBNP (last 3 results) No results for input(s): PROBNP in the last 8760 hours.  CBG: No results for input(s): GLUCAP in the  last 168 hours.  No results found for this or any previous visit (from the past 240 hour(s)).   Studies: Ct Head Wo Contrast  09/14/2014   CLINICAL DATA:  Recent fall with scalp hematoma  EXAM: CT HEAD WITHOUT CONTRAST  TECHNIQUE: Contiguous axial images were obtained from the base of the skull through the vertex without intravenous contrast.  COMPARISON:  09/14/2014  FINDINGS: Left posterior parietal scalp hematoma is again identified. The underlying bony calvarium is intact. Mild atrophic changes are noted. Mild chronic white matter ischemic change is seen. No acute hemorrhage, acute infarction or space-occupying mass lesion is noted.  IMPRESSION: Scalp hematoma stable in appearance. No new focal abnormality is seen.   Electronically Signed   By: Alcide Clever M.D.   On: 09/14/2014 13:04   Ct Head Wo Contrast  09/14/2014   ADDENDUM REPORT: 09/14/2014 01:57  ADDENDUM: Upon further evaluation, acute subarachnoid hemorrhage is noted along the left parietal lobe and minimally along the left Sylvian fissure.  Critical Value/emergent results were called by telephone at the time of interpretation on 09/14/2014 at 1:57 am to the ER physician, who verbally acknowledged these results.   Electronically Signed   By: Roanna Raider M.D.   On: 09/14/2014 01:57   09/14/2014   CLINICAL DATA:  Slipped and  fell in bathroom. Hit head on side of door. Left posterior scalp laceration and left supraorbital laceration. Concern for cervical spine injury. Initial encounter.  EXAM: CT HEAD WITHOUT CONTRAST  CT MAXILLOFACIAL WITHOUT CONTRAST  CT CERVICAL SPINE WITHOUT CONTRAST  TECHNIQUE: Multidetector CT imaging of the head, cervical spine, and maxillofacial structures were performed using the standard protocol without intravenous contrast. Multiplanar CT image reconstructions of the cervical spine and maxillofacial structures were also generated.  COMPARISON:  None.  FINDINGS: CT HEAD FINDINGS  There is no evidence of acute  infarction, mass lesion, or intra- or extra-axial hemorrhage on CT.  Prominence of the ventricles and sulci reflects mild cortical volume loss. Mild cerebellar atrophy is noted. Scattered periventricular white matter change likely reflects small vessel ischemic microangiopathy.  The brainstem and fourth ventricle are within normal limits. The basal ganglia are unremarkable in appearance. The cerebral hemispheres demonstrate grossly normal gray-white differentiation. No mass effect or midline shift is seen.  There is no evidence of fracture; visualized osseous structures are unremarkable in appearance. The orbits are within normal limits. The paranasal sinuses and mastoid air cells are well-aerated. Soft tissue swelling is noted lateral and superior to the left orbit. Significant soft tissue swelling and laceration are seen at the left posterior parietal calvarium, with a prominent focal 2.4 cm soft tissue hematoma.  CT MAXILLOFACIAL FINDINGS  There is no evidence of fracture or dislocation. The maxilla and mandible appear intact. The nasal bone is unremarkable in appearance. The visualized dentition demonstrates no acute abnormality. Absence of the maxillary teeth is thought to be subacute in nature, though underlying defects are still seen.  The orbits are intact bilaterally. The visualized paranasal sinuses and mastoid air cells are well-aerated.  Soft tissue swelling is noted lateral and superior to the left orbit. The parapharyngeal fat planes are preserved. The nasopharynx, oropharynx and hypopharynx are unremarkable in appearance. The visualized portions of the valleculae and piriform sinuses are grossly unremarkable.  The parotid and submandibular glands are within normal limits. No cervical lymphadenopathy is seen.  CT CERVICAL SPINE FINDINGS  There is no evidence of acute fracture or subluxation. There is mild grade 1 anterolisthesis of C2 on C3, and diffuse disc space narrowing along the cervical spine,  with scattered anterior and posterior disc osteophyte complexes, and underlying facet disease. There is suggestion of mild grade 1 anterolisthesis of C7 on T1. Vertebral bodies demonstrate normal height.  The thyroid gland is unremarkable in appearance. The visualized lung apices are clear. Retropharyngeal common carotid arteries are seen. Scattered calcification is noted at the carotid bifurcations bilaterally.  IMPRESSION: 1. No evidence of traumatic intracranial injury or fracture. 2. No evidence of fracture or dislocation with regard to the maxillofacial structures. 3. No evidence of acute fracture or subluxation along the cervical spine. 4. Significant soft tissue swelling and laceration at the left posterior parietal calvarium, with a prominent focal 2.4 cm soft tissue hematoma. Soft tissue swelling noted lateral and superior to the left orbit. 5. Absence of the maxillary teeth is thought to be subacute in nature, though underlying defects are still seen. 6. Mild cortical volume loss and scattered small vessel ischemic microangiopathy. 7. Diffuse degenerative change along the cervical spine. 8. Scattered calcification at the carotid bifurcations bilaterally. Carotid ultrasound would be helpful for further evaluation, when and as deemed clinically appropriate.  Electronically Signed: By: Roanna Raider M.D. On: 09/14/2014 01:35   Ct Cervical Spine Wo Contrast  09/14/2014   ADDENDUM REPORT: 09/14/2014 01:57  ADDENDUM: Upon further evaluation, acute subarachnoid hemorrhage is noted along the left parietal lobe and minimally along the left Sylvian fissure.  Critical Value/emergent results were called by telephone at the time of interpretation on 09/14/2014 at 1:57 am to the ER physician, who verbally acknowledged these results.   Electronically Signed   By: Roanna Raider M.D.   On: 09/14/2014 01:57   09/14/2014   CLINICAL DATA:  Slipped and fell in bathroom. Hit head on side of door. Left posterior scalp  laceration and left supraorbital laceration. Concern for cervical spine injury. Initial encounter.  EXAM: CT HEAD WITHOUT CONTRAST  CT MAXILLOFACIAL WITHOUT CONTRAST  CT CERVICAL SPINE WITHOUT CONTRAST  TECHNIQUE: Multidetector CT imaging of the head, cervical spine, and maxillofacial structures were performed using the standard protocol without intravenous contrast. Multiplanar CT image reconstructions of the cervical spine and maxillofacial structures were also generated.  COMPARISON:  None.  FINDINGS: CT HEAD FINDINGS  There is no evidence of acute infarction, mass lesion, or intra- or extra-axial hemorrhage on CT.  Prominence of the ventricles and sulci reflects mild cortical volume loss. Mild cerebellar atrophy is noted. Scattered periventricular white matter change likely reflects small vessel ischemic microangiopathy.  The brainstem and fourth ventricle are within normal limits. The basal ganglia are unremarkable in appearance. The cerebral hemispheres demonstrate grossly normal gray-white differentiation. No mass effect or midline shift is seen.  There is no evidence of fracture; visualized osseous structures are unremarkable in appearance. The orbits are within normal limits. The paranasal sinuses and mastoid air cells are well-aerated. Soft tissue swelling is noted lateral and superior to the left orbit. Significant soft tissue swelling and laceration are seen at the left posterior parietal calvarium, with a prominent focal 2.4 cm soft tissue hematoma.  CT MAXILLOFACIAL FINDINGS  There is no evidence of fracture or dislocation. The maxilla and mandible appear intact. The nasal bone is unremarkable in appearance. The visualized dentition demonstrates no acute abnormality. Absence of the maxillary teeth is thought to be subacute in nature, though underlying defects are still seen.  The orbits are intact bilaterally. The visualized paranasal sinuses and mastoid air cells are well-aerated.  Soft tissue  swelling is noted lateral and superior to the left orbit. The parapharyngeal fat planes are preserved. The nasopharynx, oropharynx and hypopharynx are unremarkable in appearance. The visualized portions of the valleculae and piriform sinuses are grossly unremarkable.  The parotid and submandibular glands are within normal limits. No cervical lymphadenopathy is seen.  CT CERVICAL SPINE FINDINGS  There is no evidence of acute fracture or subluxation. There is mild grade 1 anterolisthesis of C2 on C3, and diffuse disc space narrowing along the cervical spine, with scattered anterior and posterior disc osteophyte complexes, and underlying facet disease. There is suggestion of mild grade 1 anterolisthesis of C7 on T1. Vertebral bodies demonstrate normal height.  The thyroid gland is unremarkable in appearance. The visualized lung apices are clear. Retropharyngeal common carotid arteries are seen. Scattered calcification is noted at the carotid bifurcations bilaterally.  IMPRESSION: 1. No evidence of traumatic intracranial injury or fracture. 2. No evidence of fracture or dislocation with regard to the maxillofacial structures. 3. No evidence of acute fracture or subluxation along the cervical spine. 4. Significant soft tissue swelling and laceration at the left posterior parietal calvarium, with a prominent focal 2.4 cm soft tissue hematoma. Soft tissue swelling noted lateral and superior to the left orbit. 5. Absence of the maxillary teeth is thought to be subacute in nature,  though underlying defects are still seen. 6. Mild cortical volume loss and scattered small vessel ischemic microangiopathy. 7. Diffuse degenerative change along the cervical spine. 8. Scattered calcification at the carotid bifurcations bilaterally. Carotid ultrasound would be helpful for further evaluation, when and as deemed clinically appropriate.  Electronically Signed: By: Roanna Raider M.D. On: 09/14/2014 01:35   Ct Maxillofacial Wo  Cm  09/14/2014   ADDENDUM REPORT: 09/14/2014 01:57  ADDENDUM: Upon further evaluation, acute subarachnoid hemorrhage is noted along the left parietal lobe and minimally along the left Sylvian fissure.  Critical Value/emergent results were called by telephone at the time of interpretation on 09/14/2014 at 1:57 am to the ER physician, who verbally acknowledged these results.   Electronically Signed   By: Roanna Raider M.D.   On: 09/14/2014 01:57   09/14/2014   CLINICAL DATA:  Slipped and fell in bathroom. Hit head on side of door. Left posterior scalp laceration and left supraorbital laceration. Concern for cervical spine injury. Initial encounter.  EXAM: CT HEAD WITHOUT CONTRAST  CT MAXILLOFACIAL WITHOUT CONTRAST  CT CERVICAL SPINE WITHOUT CONTRAST  TECHNIQUE: Multidetector CT imaging of the head, cervical spine, and maxillofacial structures were performed using the standard protocol without intravenous contrast. Multiplanar CT image reconstructions of the cervical spine and maxillofacial structures were also generated.  COMPARISON:  None.  FINDINGS: CT HEAD FINDINGS  There is no evidence of acute infarction, mass lesion, or intra- or extra-axial hemorrhage on CT.  Prominence of the ventricles and sulci reflects mild cortical volume loss. Mild cerebellar atrophy is noted. Scattered periventricular white matter change likely reflects small vessel ischemic microangiopathy.  The brainstem and fourth ventricle are within normal limits. The basal ganglia are unremarkable in appearance. The cerebral hemispheres demonstrate grossly normal gray-white differentiation. No mass effect or midline shift is seen.  There is no evidence of fracture; visualized osseous structures are unremarkable in appearance. The orbits are within normal limits. The paranasal sinuses and mastoid air cells are well-aerated. Soft tissue swelling is noted lateral and superior to the left orbit. Significant soft tissue swelling and laceration are  seen at the left posterior parietal calvarium, with a prominent focal 2.4 cm soft tissue hematoma.  CT MAXILLOFACIAL FINDINGS  There is no evidence of fracture or dislocation. The maxilla and mandible appear intact. The nasal bone is unremarkable in appearance. The visualized dentition demonstrates no acute abnormality. Absence of the maxillary teeth is thought to be subacute in nature, though underlying defects are still seen.  The orbits are intact bilaterally. The visualized paranasal sinuses and mastoid air cells are well-aerated.  Soft tissue swelling is noted lateral and superior to the left orbit. The parapharyngeal fat planes are preserved. The nasopharynx, oropharynx and hypopharynx are unremarkable in appearance. The visualized portions of the valleculae and piriform sinuses are grossly unremarkable.  The parotid and submandibular glands are within normal limits. No cervical lymphadenopathy is seen.  CT CERVICAL SPINE FINDINGS  There is no evidence of acute fracture or subluxation. There is mild grade 1 anterolisthesis of C2 on C3, and diffuse disc space narrowing along the cervical spine, with scattered anterior and posterior disc osteophyte complexes, and underlying facet disease. There is suggestion of mild grade 1 anterolisthesis of C7 on T1. Vertebral bodies demonstrate normal height.  The thyroid gland is unremarkable in appearance. The visualized lung apices are clear. Retropharyngeal common carotid arteries are seen. Scattered calcification is noted at the carotid bifurcations bilaterally.  IMPRESSION: 1. No evidence of traumatic intracranial injury  or fracture. 2. No evidence of fracture or dislocation with regard to the maxillofacial structures. 3. No evidence of acute fracture or subluxation along the cervical spine. 4. Significant soft tissue swelling and laceration at the left posterior parietal calvarium, with a prominent focal 2.4 cm soft tissue hematoma. Soft tissue swelling noted lateral  and superior to the left orbit. 5. Absence of the maxillary teeth is thought to be subacute in nature, though underlying defects are still seen. 6. Mild cortical volume loss and scattered small vessel ischemic microangiopathy. 7. Diffuse degenerative change along the cervical spine. 8. Scattered calcification at the carotid bifurcations bilaterally. Carotid ultrasound would be helpful for further evaluation, when and as deemed clinically appropriate.  Electronically Signed: By: Roanna Raider M.D. On: 09/14/2014 01:35    Scheduled Meds: . diltiazem  180 mg Oral Daily  . DULoxetine  90 mg Oral Daily  . furosemide  80 mg Oral Daily  . gabapentin  600 mg Oral Daily   And  . gabapentin  1,200 mg Oral QHS  . hydrocortisone sod succinate (SOLU-CORTEF) inj  50 mg Intravenous Q8H  . magnesium oxide  400 mg Oral Daily  . metoprolol succinate  100 mg Oral Daily  . multivitamin with minerals  1 tablet Oral Daily  . pantoprazole  40 mg Oral Daily  . potassium chloride SA  40 mEq Oral BID  . [START ON 09/17/2014] predniSONE  7.5 mg Oral Q breakfast  . sodium chloride  3 mL Intravenous Q12H   Continuous Infusions:   Time spent: 15 minutes  Shemia Bevel L  [facility] [email or web address], password [facility] 09/15/2014, 8:42 PM  LOS: 1 day

## 2014-09-16 DIAGNOSIS — E2740 Unspecified adrenocortical insufficiency: Secondary | ICD-10-CM

## 2014-09-16 DIAGNOSIS — S066X4S Traumatic subarachnoid hemorrhage with loss of consciousness of 6 hours to 24 hours, sequela: Secondary | ICD-10-CM

## 2014-09-16 MED ORDER — CYCLOBENZAPRINE HCL 5 MG PO TABS: 5.0000 mg | ORAL_TABLET | Freq: Three times a day (TID) | ORAL | Status: DC | PRN

## 2014-09-16 MED ORDER — CYCLOBENZAPRINE HCL 10 MG PO TABS
5.0000 mg | ORAL_TABLET | Freq: Once | ORAL | Status: AC
  Administered 2014-09-16: 5 mg via ORAL
  Filled 2014-09-16: qty 1

## 2014-09-16 NOTE — Progress Notes (Signed)
Pt is being discharged home. Discharge instructions were given to patient and family 

## 2014-09-16 NOTE — Progress Notes (Signed)
Rehab Admissions Coordinator Note:  Patient was screened by Trish Mage for appropriateness for an Inpatient Acute Rehab Consult.  At this time,an inpatient rehab consult has been ordered and is pending.  Ottie Glazier will follow up once consult is completed and can be reached at [phone].   Lelon Frohlich M 09/16/2014, 8:08 AM

## 2014-09-16 NOTE — Care Management Note (Signed)
Case Management Note  Patient Details  Name: Andrea Yu MRN: [id number] Date of Birth: Feb 28, 1940  Subjective/Objective:                    Action/Plan: Met with patient and husband to discuss discharge planning. Patient is agreeable to outpatient PT and has chosen [facility].  Referral was sent through [facility], written information was provided and added to the AVS.  Bedside RN updated.  Expected Discharge Date:                  Expected Discharge Plan:  Home/Self Care  In-House Referral:     Discharge planning Services  CM Consult  Post Acute Care Choice:    Choice offered to:  Patient  DME Arranged:    DME Agency:     HH Arranged:    [place] Agency:     Status of Service:  Completed, signed off  Medicare Important Message Given:    Date Medicare IM Given:    Medicare IM give by:    Date Additional Medicare IM Given:    Additional Medicare Important Message give by:     If discussed at [place] of Stay Meetings, dates discussed:    Additional Comments:  Rolm Baptise, RN 09/16/2014, 1:33 PM [phone]

## 2014-09-16 NOTE — Consult Note (Signed)
Physical Medicine and Rehabilitation Consult Reason for Consult: Traumatic subarachnoid hemorrhage Referring Physician: [facility]   HPI: Andrea Yu is a 74 y.o. right handed female admitted 09/14/2014 with history of atrial fibrillation no anticoagulation due to history of GI bleed, hypertension, psoriatic arthritis. Patient lives with her husband modified independent with rolling walker prior to admission. Presented 09/14/2014 after a fall at home hitting her head while getting up to the bathroom. She denied loss of consciousness. Sustained left eyebrow laceration. CT and imaging of the left posterior sylvian subarachnoid hemorrhage. Neurosurgery consulted advise conservative care. Physical therapy evaluation completed 09/14/2014 noting ongoing bouts of dizziness and recommendations of physical medicine rehabilitation consult.   Review of Systems  Constitutional: Negative for fever and chills.  Eyes: Negative for blurred vision and double vision.  Respiratory: Negative for cough and shortness of breath.   Cardiovascular: Positive for palpitations and leg swelling.  Gastrointestinal: Positive for nausea and constipation.       GERD  Genitourinary: Positive for urgency.  Musculoskeletal: Positive for myalgias, joint pain and falls.  Neurological: Negative for headaches.   Past Medical History  Diagnosis Date  . Hypertension   . Peripheral neuropathy     "from back OR in 2005; in hands and feet" (11/07/2012)  . Anemia   . History of blood transfusion     "after 1 of my knee ORs and today" (11/07/2012)  . A-fib     "just after knee 2nd OR" (11/07/2012)  . Psoriatic arthritis     on Prednisone Rx   Past Surgical History  Procedure Laterality Date  . Back surgery    . Replacement total knee bilateral Bilateral 2007  . Cholecystectomy  2000's  . Joint replacement    . Vaginal hysterectomy  1983  . Tubal ligation  1974  . Cataract extraction w/ intraocular lens  implant,  bilateral Bilateral 2007  . Posterior lumbar fusion  2005    "got a rod and 4 pins in there" (11/07/2012)  . Colonoscopy N/A 11/10/2012    Procedure: COLONOSCOPY;  Surgeon: Theda Belfast, MD;  Location: [facility] ENDOSCOPY;  Service: Endoscopy;  Laterality: N/A;  . Esophagogastroduodenoscopy N/A 11/10/2012    Procedure: ESOPHAGOGASTRODUODENOSCOPY (EGD);  Surgeon: Theda Belfast, MD;  Location: [facility] ENDOSCOPY;  Service: Endoscopy;  Laterality: N/A;  . Transthoracic echocardiogram  05/24/2006   Family History  Problem Relation Age of Onset  . Heart attack Father     1951-deceased   . Aneurysm Son 70    Deceased   . Liver disease Mother    Social History:  reports that she quit smoking about 33 years ago. Her smoking use included Cigarettes. She has a 15 pack-year smoking history. She has never used smokeless tobacco. She reports that she does not drink alcohol or use illicit drugs. Allergies:  Allergies  Allergen Reactions  . Penicillins Other (See Comments)    Throat swelling    Medications Prior to Admission  Medication Sig Dispense Refill  . acetaminophen (TYLENOL) 325 MG tablet Take 2 tablets (650 mg total) by mouth every 4 (four) hours as needed. (Patient taking differently: Take 650 mg by mouth every 4 (four) hours as needed for mild pain. )    . aspirin EC 81 MG tablet Take 1 tablet (81 mg total) by mouth daily. 90 tablet 3  . BIOTIN 5000 PO Take 5,000 mg by mouth at bedtime.     Marland Kitchen diltiazem (CARDIZEM CD) 180 MG 24 hr capsule  Take 1 capsule (180 mg total) by mouth daily.    . DULoxetine (CYMBALTA) 30 MG capsule Take along with 60 mg. Total 90 mg    . DULoxetine (CYMBALTA) 60 MG capsule Take 60 mg by mouth daily. Take along with 30mg  capsule to make a total of 90mg     . etanercept (ENBREL) 50 MG/ML injection Inject 50 mg into the skin once a week. Tuesday    . furosemide (LASIX) 40 MG tablet Take 80 mg by mouth daily.     gabapentin (NEURONTIN) 600 MG tablet Take 600-1,200 mg by mouth  2 (two) times daily. 1 tablet in morning and 2 tablets at night    . HYDROcodone-acetaminophen (NORCO/VICODIN) 5-325 MG per tablet Take 1 tablet by mouth every 6 (six) hours as needed for pain.    . Magnesium 250 MG TABS Take 1 tablet by mouth daily.    . metoprolol succinate (TOPROL-XL) 100 MG 24 hr tablet Take 1 tablet (100 mg total) by mouth daily. Take with or immediately following a meal. 90 tablet 3  . Multiple Vitamin (MULTIVITAMIN WITH MINERALS) TABS Take 1 tablet by mouth daily.    Monday omeprazole (PRILOSEC) 40 MG capsule Take 1 capsule (40 mg total) by mouth daily. 30 capsule 0  . potassium chloride SA (K-DUR,KLOR-CON) 20 MEQ tablet Take 40 mEq by mouth 2 (two) times daily.     . predniSONE (DELTASONE) 5 MG tablet Take 7.5 mg by mouth daily.     Marland Kitchen PROAIR HFA 108 (90 BASE) MCG/ACT inhaler Inhale 1-2 puffs into the lungs every 6 (six) hours as needed for wheezing or shortness of breath.     . Vitamin D, Ergocalciferol, (DRISDOL) 50000 UNITS CAPS Take 50,000 Units by mouth every 7 (seven) days. Tuesday      Home: Home Living Family/patient expects to be discharged to:: Private residence Living Arrangements: Spouse/significant other Available Help at Discharge: Family, Available 24 hours/day Type of Home: House Home Access: Ramped entrance Home Layout: One level Bathroom Shower/Tub: Thursday: Handicapped height Bathroom Accessibility: Yes Home Equipment: [phone], Wheelchair - manual, Bedside commode, Engineer, manufacturing systems - 2 wheels, Cane - single point Additional Comments: uses RW, has had several falls, currently has right hip bursitis. Daughter checks on her and helps out as needed  Functional History: Prior Function Level of Independence: Needs assistance Gait / Transfers Assistance Needed: mod I with RW ADL's / Homemaking Assistance Needed: family assists with bathing and dressing Communication / Swallowing Assistance Needed: just got new dentures and she says  she is slurring a little bit with them Functional Status:  Mobility: Bed Mobility Overal bed mobility: Needs Assistance Bed Mobility: Sit to Supine, Supine to Sit Supine to sit: Min assist Sit to supine: Mod assist General bed mobility comments: verbal cues for sequencing Transfers Overall transfer level: Needs assistance Equipment used: Rolling walker (2 wheeled) Transfers: Sit to/from Stand Sit to Stand: Min assist General transfer comment: verbal cues for hand placement Ambulation/Gait Ambulation/Gait assistance: Min assist Ambulation Distance (Feet): 100 Feet Assistive device: Rolling walker (2 wheeled) Gait Pattern/deviations: Step-through pattern, Decreased stride length General Gait Details: L knee buckling x 1 during ambulation but able to self correct and maintain balance Gait velocity: mildly decreased Gait velocity interpretation: Below normal speed for age/gender    ADL:    Cognition: Cognition Overall Cognitive Status: Within Functional Limits for tasks assessed Orientation Level: Oriented X4 Cognition Arousal/Alertness: Awake/alert Behavior During Therapy: WFL for tasks assessed/performed Overall Cognitive Status: Within  Functional Limits for tasks assessed  Blood pressure 108/60, pulse 75, temperature 98.3 F (36.8 C), temperature source Oral, resp. rate 20, height 4\' 11"  (1.499 m), weight 86.41 kg (190 lb 8 oz), SpO2 98 %. Physical Exam  Constitutional: She is oriented to person, place, and time.  HENT:  Bruising to orbital left eye  Eyes:  Pupils reactive to light  Neck: Normal range of motion. Neck supple. No thyromegaly present.  Cardiovascular:  Cardiac rate controlled  Respiratory: Effort normal and breath sounds normal. No respiratory distress.  GI: Soft. Bowel sounds are normal. She exhibits no distension.  Neurological: She is alert and oriented to person, place, and time.  Patient is a bit impulsive. She does recall the events of her fall.  Follows simple commands. UE deltoids 3/5 limited due to shoudler pain/ROM. Biceps,triceps, wrist, hands 5/5. LE: 4/5 HF, 4+ ke and adf/apf. Distal sensory loss below malleoli  Skin: Skin is warm and dry.  Psychiatric: She has a normal mood and affect. Her behavior is normal. Judgment and thought content normal.    No results found for this or any previous visit (from the past 24 hour(s)). Ct Head Wo Contrast  09/14/2014   CLINICAL DATA:  Recent fall with scalp hematoma  EXAM: CT HEAD WITHOUT CONTRAST  TECHNIQUE: Contiguous axial images were obtained from the base of the skull through the vertex without intravenous contrast.  COMPARISON:  09/14/2014  FINDINGS: Left posterior parietal scalp hematoma is again identified. The underlying bony calvarium is intact. Mild atrophic changes are noted. Mild chronic white matter ischemic change is seen. No acute hemorrhage, acute infarction or space-occupying mass lesion is noted.  IMPRESSION: Scalp hematoma stable in appearance. No new focal abnormality is seen.   Electronically Signed   By: 09/16/2014 M.D.   On: 09/14/2014 13:04    Assessment/Plan: Diagnosis: traumatic SAH, chronic gait disorder 1. Does the need for close, 24 hr/day medical supervision in concert with the patient's rehab needs make it unreasonable for this patient to be served in a less intensive setting? Yes 2. Co-Morbidities requiring supervision/potential complications: paf, htn 3. Due to bladder management, bowel management, safety, disease management, medication administration and pain management, does the patient require 24 hr/day rehab nursing? No 4. Does the patient require coordinated care of a physician, rehab nurse, PT (1-2 hrs/day, 5 days/week) and OT (1-2 hrs/day, 5 days/week) to address physical and functional deficits in the context of the above medical diagnosis(es)? No Addressing deficits in the following areas: balance, endurance, locomotion, strength, transferring,  bowel/bladder control, bathing, dressing, feeding, grooming and toileting 5. Can the patient actively participate in an intensive therapy program of at least 3 hrs of therapy per day at least 5 days per week? Potentially 6. The potential for patient to make measurable gains while on inpatient rehab is fair 7. Anticipated functional outcomes upon discharge from inpatient rehab are n/a  with PT, n/a with OT, n/a with SLP. 8. Estimated rehab length of stay to reach the above functional goals is: n/a 9. Does the patient have adequate social supports and living environment to accommodate these discharge functional goals? Potentially 10. Anticipated D/C setting: Home 11. Anticipated post D/C treatments: Outpatient therapy 12. Overall Rehab/Functional Prognosis: excellent  RECOMMENDATIONS: This patient's condition is appropriate for continued rehabilitative care in the following setting: Outpatient Therapy Patient has agreed to participate in recommended program. Yes Note that insurance prior authorization may be required for reimbursement for recommended care.  Comment: Would be a good  candidate for our outpt neuro-rehab once discharged home.   Ranelle Oyster, MD, [facility] Physical Medicine & Rehabilitation 09/16/2014     09/16/2014

## 2014-09-16 NOTE — Discharge Summary (Signed)
Physician Discharge Summary  Andrea Yu ZOX:[id number] DOB: 1940-02-14 DOA: 09/13/2014  PCP: Garlan Fillers, MD  Admit date: 09/13/2014 Discharge date: 09/16/2014  Time spent: greater than 30 minutes  Recommendations for Outpatient Follow-up:  1. Outpatient PT arranged  Discharge Diagnoses:  Principal Problem:   SAH (subarachnoid hemorrhage) Active Problems:   Adrenal insufficiency   PAF (paroxysmal atrial fibrillation)   Iron deficiency anemia   Hypertension   Laceration of eyebrow, left   Fall   Discharge Condition: stable  Diet recommendation: heart healthy  Filed Weights   09/13/14 2354 09/14/14 0358  Weight: 83.008 kg (183 lb) 86.41 kg (190 lb 8 oz)    History of present illness:  74 y.o. female with a history of Atrial Fibrillation ( not on Anticoagulant Rx due to Hx of GI Bleeding), HTN, Anemia, and Psoriatic Arthritis who presents to the ED after falling in her home and hitting her head. She reports that she got up to go to the Bathroom and did not make it there and slipped on her urine and fell back hitting her head on the door. She denies any loss of consciousness. She was evaluated in the ED and a CT scan of the head revealed a Subarachnoid Hemorrhage of the left parietal lobe. Neurosurgery Dr Bevely Palmer was contacted by the EDP Dr. Mora Bellman and is to see the patient this AM  Hospital Course:  Admitted to hospitalists. Neurosurgery consulted repeat CT brain stable. Worked with PT, OT. Rehab consulted but did not feel patient needs inpatient rehab. Hold aspirin for 2 more weeks  Procedures:  none  Consultations:  Neurosurgery  PM&R  Discharge Exam: Filed Vitals:   09/16/14 0916  BP: 114/56  Pulse: 73  Temp: 98.3 F (36.8 C)  Resp: 18    General: a and o Cardiovascular: RRR Respiratory: CTA  Discharge Instructions   Discharge Instructions    Diet - low sodium heart healthy    Complete by:  As directed      Discharge instructions     Complete by:  As directed   Hold aspirin for 2 weeks then resume     Increase activity slowly    Complete by:  As directed           Current Discharge Medication List    START taking these medications   Details  cyclobenzaprine (FLEXERIL) 5 MG tablet Take 1 tablet (5 mg total) by mouth 3 (three) times daily as needed for muscle spasms. Qty: 10 tablet, Refills: 0      CONTINUE these medications which have NOT CHANGED   Details  acetaminophen (TYLENOL) 325 MG tablet Take 2 tablets (650 mg total) by mouth every 4 (four) hours as needed.    BIOTIN 5000 PO Take 5,000 mg by mouth at bedtime.     diltiazem (CARDIZEM CD) 180 MG 24 hr capsule Take 1 capsule (180 mg total) by mouth daily.    !! DULoxetine (CYMBALTA) 30 MG capsule Take along with 60 mg. Total 90 mg    !! DULoxetine (CYMBALTA) 60 MG capsule Take 60 mg by mouth daily. Take along with 30mg  capsule to make a total of 90mg     etanercept (ENBREL) 50 MG/ML injection Inject 50 mg into the skin once a week. Tuesday    furosemide (LASIX) 40 MG tablet Take 80 mg by mouth daily.     gabapentin (NEURONTIN) 600 MG tablet Take 600-1,200 mg by mouth 2 (two) times daily. 1 tablet in morning and 2 tablets at  night    HYDROcodone-acetaminophen (NORCO/VICODIN) 5-325 MG per tablet Take 1 tablet by mouth every 6 (six) hours as needed for pain.    Magnesium 250 MG TABS Take 1 tablet by mouth daily.    metoprolol succinate (TOPROL-XL) 100 MG 24 hr tablet Take 1 tablet (100 mg total) by mouth daily. Take with or immediately following a meal. Qty: 90 tablet, Refills: 3    Multiple Vitamin (MULTIVITAMIN WITH MINERALS) TABS Take 1 tablet by mouth daily.    omeprazole (PRILOSEC) 40 MG capsule Take 1 capsule (40 mg total) by mouth daily. Qty: 30 capsule, Refills: 0    potassium chloride SA (K-DUR,KLOR-CON) 20 MEQ tablet Take 40 mEq by mouth 2 (two) times daily.     predniSONE (DELTASONE) 5 MG tablet Take 7.5 mg by mouth daily.     PROAIR  HFA 108 (90 BASE) MCG/ACT inhaler Inhale 1-2 puffs into the lungs every 6 (six) hours as needed for wheezing or shortness of breath.     Vitamin D, Ergocalciferol, (DRISDOL) 50000 UNITS CAPS Take 50,000 Units by mouth every 7 (seven) days. Tuesday     !! - Potential duplicate medications found. Please discuss with provider.    STOP taking these medications     aspirin EC 81 MG tablet        Allergies  Allergen Reactions  . Penicillins Other (See Comments)    Throat swelling       The results of significant diagnostics from this hospitalization (including imaging, microbiology, ancillary and laboratory) are listed below for reference.    Significant Diagnostic Studies: Ct Head Wo Contrast  09/14/2014   CLINICAL DATA:  Recent fall with scalp hematoma  EXAM: CT HEAD WITHOUT CONTRAST  TECHNIQUE: Contiguous axial images were obtained from the base of the skull through the vertex without intravenous contrast.  COMPARISON:  09/14/2014  FINDINGS: Left posterior parietal scalp hematoma is again identified. The underlying bony calvarium is intact. Mild atrophic changes are noted. Mild chronic white matter ischemic change is seen. No acute hemorrhage, acute infarction or space-occupying mass lesion is noted.  IMPRESSION: Scalp hematoma stable in appearance. No new focal abnormality is seen.   Electronically Signed   By: Alcide Clever M.D.   On: 09/14/2014 13:04   Ct Head Wo Contrast  09/14/2014   ADDENDUM REPORT: 09/14/2014 01:57  ADDENDUM: Upon further evaluation, acute subarachnoid hemorrhage is noted along the left parietal lobe and minimally along the left Sylvian fissure.  Critical Value/emergent results were called by telephone at the time of interpretation on 09/14/2014 at 1:57 am to the ER physician, who verbally acknowledged these results.   Electronically Signed   By: Roanna Raider M.D.   On: 09/14/2014 01:57   09/14/2014   CLINICAL DATA:  Slipped and fell in bathroom. Hit head on side of  door. Left posterior scalp laceration and left supraorbital laceration. Concern for cervical spine injury. Initial encounter.  EXAM: CT HEAD WITHOUT CONTRAST  CT MAXILLOFACIAL WITHOUT CONTRAST  CT CERVICAL SPINE WITHOUT CONTRAST  TECHNIQUE: Multidetector CT imaging of the head, cervical spine, and maxillofacial structures were performed using the standard protocol without intravenous contrast. Multiplanar CT image reconstructions of the cervical spine and maxillofacial structures were also generated.  COMPARISON:  None.  FINDINGS: CT HEAD FINDINGS  There is no evidence of acute infarction, mass lesion, or intra- or extra-axial hemorrhage on CT.  Prominence of the ventricles and sulci reflects mild cortical volume loss. Mild cerebellar atrophy is noted. Scattered periventricular white  matter change likely reflects small vessel ischemic microangiopathy.  The brainstem and fourth ventricle are within normal limits. The basal ganglia are unremarkable in appearance. The cerebral hemispheres demonstrate grossly normal gray-white differentiation. No mass effect or midline shift is seen.  There is no evidence of fracture; visualized osseous structures are unremarkable in appearance. The orbits are within normal limits. The paranasal sinuses and mastoid air cells are well-aerated. Soft tissue swelling is noted lateral and superior to the left orbit. Significant soft tissue swelling and laceration are seen at the left posterior parietal calvarium, with a prominent focal 2.4 cm soft tissue hematoma.  CT MAXILLOFACIAL FINDINGS  There is no evidence of fracture or dislocation. The maxilla and mandible appear intact. The nasal bone is unremarkable in appearance. The visualized dentition demonstrates no acute abnormality. Absence of the maxillary teeth is thought to be subacute in nature, though underlying defects are still seen.  The orbits are intact bilaterally. The visualized paranasal sinuses and mastoid air cells are  well-aerated.  Soft tissue swelling is noted lateral and superior to the left orbit. The parapharyngeal fat planes are preserved. The nasopharynx, oropharynx and hypopharynx are unremarkable in appearance. The visualized portions of the valleculae and piriform sinuses are grossly unremarkable.  The parotid and submandibular glands are within normal limits. No cervical lymphadenopathy is seen.  CT CERVICAL SPINE FINDINGS  There is no evidence of acute fracture or subluxation. There is mild grade 1 anterolisthesis of C2 on C3, and diffuse disc space narrowing along the cervical spine, with scattered anterior and posterior disc osteophyte complexes, and underlying facet disease. There is suggestion of mild grade 1 anterolisthesis of C7 on T1. Vertebral bodies demonstrate normal height.  The thyroid gland is unremarkable in appearance. The visualized lung apices are clear. Retropharyngeal common carotid arteries are seen. Scattered calcification is noted at the carotid bifurcations bilaterally.  IMPRESSION: 1. No evidence of traumatic intracranial injury or fracture. 2. No evidence of fracture or dislocation with regard to the maxillofacial structures. 3. No evidence of acute fracture or subluxation along the cervical spine. 4. Significant soft tissue swelling and laceration at the left posterior parietal calvarium, with a prominent focal 2.4 cm soft tissue hematoma. Soft tissue swelling noted lateral and superior to the left orbit. 5. Absence of the maxillary teeth is thought to be subacute in nature, though underlying defects are still seen. 6. Mild cortical volume loss and scattered small vessel ischemic microangiopathy. 7. Diffuse degenerative change along the cervical spine. 8. Scattered calcification at the carotid bifurcations bilaterally. Carotid ultrasound would be helpful for further evaluation, when and as deemed clinically appropriate.  Electronically Signed: By: Roanna Raider M.D. On: 09/14/2014 01:35    Ct Cervical Spine Wo Contrast  09/14/2014   ADDENDUM REPORT: 09/14/2014 01:57  ADDENDUM: Upon further evaluation, acute subarachnoid hemorrhage is noted along the left parietal lobe and minimally along the left Sylvian fissure.  Critical Value/emergent results were called by telephone at the time of interpretation on 09/14/2014 at 1:57 am to the ER physician, who verbally acknowledged these results.   Electronically Signed   By: Roanna Raider M.D.   On: 09/14/2014 01:57   09/14/2014   CLINICAL DATA:  Slipped and fell in bathroom. Hit head on side of door. Left posterior scalp laceration and left supraorbital laceration. Concern for cervical spine injury. Initial encounter.  EXAM: CT HEAD WITHOUT CONTRAST  CT MAXILLOFACIAL WITHOUT CONTRAST  CT CERVICAL SPINE WITHOUT CONTRAST  TECHNIQUE: Multidetector CT imaging of the head,  cervical spine, and maxillofacial structures were performed using the standard protocol without intravenous contrast. Multiplanar CT image reconstructions of the cervical spine and maxillofacial structures were also generated.  COMPARISON:  None.  FINDINGS: CT HEAD FINDINGS  There is no evidence of acute infarction, mass lesion, or intra- or extra-axial hemorrhage on CT.  Prominence of the ventricles and sulci reflects mild cortical volume loss. Mild cerebellar atrophy is noted. Scattered periventricular white matter change likely reflects small vessel ischemic microangiopathy.  The brainstem and fourth ventricle are within normal limits. The basal ganglia are unremarkable in appearance. The cerebral hemispheres demonstrate grossly normal gray-white differentiation. No mass effect or midline shift is seen.  There is no evidence of fracture; visualized osseous structures are unremarkable in appearance. The orbits are within normal limits. The paranasal sinuses and mastoid air cells are well-aerated. Soft tissue swelling is noted lateral and superior to the left orbit. Significant soft tissue  swelling and laceration are seen at the left posterior parietal calvarium, with a prominent focal 2.4 cm soft tissue hematoma.  CT MAXILLOFACIAL FINDINGS  There is no evidence of fracture or dislocation. The maxilla and mandible appear intact. The nasal bone is unremarkable in appearance. The visualized dentition demonstrates no acute abnormality. Absence of the maxillary teeth is thought to be subacute in nature, though underlying defects are still seen.  The orbits are intact bilaterally. The visualized paranasal sinuses and mastoid air cells are well-aerated.  Soft tissue swelling is noted lateral and superior to the left orbit. The parapharyngeal fat planes are preserved. The nasopharynx, oropharynx and hypopharynx are unremarkable in appearance. The visualized portions of the valleculae and piriform sinuses are grossly unremarkable.  The parotid and submandibular glands are within normal limits. No cervical lymphadenopathy is seen.  CT CERVICAL SPINE FINDINGS  There is no evidence of acute fracture or subluxation. There is mild grade 1 anterolisthesis of C2 on C3, and diffuse disc space narrowing along the cervical spine, with scattered anterior and posterior disc osteophyte complexes, and underlying facet disease. There is suggestion of mild grade 1 anterolisthesis of C7 on T1. Vertebral bodies demonstrate normal height.  The thyroid gland is unremarkable in appearance. The visualized lung apices are clear. Retropharyngeal common carotid arteries are seen. Scattered calcification is noted at the carotid bifurcations bilaterally.  IMPRESSION: 1. No evidence of traumatic intracranial injury or fracture. 2. No evidence of fracture or dislocation with regard to the maxillofacial structures. 3. No evidence of acute fracture or subluxation along the cervical spine. 4. Significant soft tissue swelling and laceration at the left posterior parietal calvarium, with a prominent focal 2.4 cm soft tissue hematoma. Soft  tissue swelling noted lateral and superior to the left orbit. 5. Absence of the maxillary teeth is thought to be subacute in nature, though underlying defects are still seen. 6. Mild cortical volume loss and scattered small vessel ischemic microangiopathy. 7. Diffuse degenerative change along the cervical spine. 8. Scattered calcification at the carotid bifurcations bilaterally. Carotid ultrasound would be helpful for further evaluation, when and as deemed clinically appropriate.  Electronically Signed: By: Roanna Raider M.D. On: 09/14/2014 01:35   Ct Maxillofacial Wo Cm  09/14/2014   ADDENDUM REPORT: 09/14/2014 01:57  ADDENDUM: Upon further evaluation, acute subarachnoid hemorrhage is noted along the left parietal lobe and minimally along the left Sylvian fissure.  Critical Value/emergent results were called by telephone at the time of interpretation on 09/14/2014 at 1:57 am to the ER physician, who verbally acknowledged these results.  Electronically Signed   By: Roanna Raider M.D.   On: 09/14/2014 01:57   09/14/2014   CLINICAL DATA:  Slipped and fell in bathroom. Hit head on side of door. Left posterior scalp laceration and left supraorbital laceration. Concern for cervical spine injury. Initial encounter.  EXAM: CT HEAD WITHOUT CONTRAST  CT MAXILLOFACIAL WITHOUT CONTRAST  CT CERVICAL SPINE WITHOUT CONTRAST  TECHNIQUE: Multidetector CT imaging of the head, cervical spine, and maxillofacial structures were performed using the standard protocol without intravenous contrast. Multiplanar CT image reconstructions of the cervical spine and maxillofacial structures were also generated.  COMPARISON:  None.  FINDINGS: CT HEAD FINDINGS  There is no evidence of acute infarction, mass lesion, or intra- or extra-axial hemorrhage on CT.  Prominence of the ventricles and sulci reflects mild cortical volume loss. Mild cerebellar atrophy is noted. Scattered periventricular white matter change likely reflects small vessel  ischemic microangiopathy.  The brainstem and fourth ventricle are within normal limits. The basal ganglia are unremarkable in appearance. The cerebral hemispheres demonstrate grossly normal gray-white differentiation. No mass effect or midline shift is seen.  There is no evidence of fracture; visualized osseous structures are unremarkable in appearance. The orbits are within normal limits. The paranasal sinuses and mastoid air cells are well-aerated. Soft tissue swelling is noted lateral and superior to the left orbit. Significant soft tissue swelling and laceration are seen at the left posterior parietal calvarium, with a prominent focal 2.4 cm soft tissue hematoma.  CT MAXILLOFACIAL FINDINGS  There is no evidence of fracture or dislocation. The maxilla and mandible appear intact. The nasal bone is unremarkable in appearance. The visualized dentition demonstrates no acute abnormality. Absence of the maxillary teeth is thought to be subacute in nature, though underlying defects are still seen.  The orbits are intact bilaterally. The visualized paranasal sinuses and mastoid air cells are well-aerated.  Soft tissue swelling is noted lateral and superior to the left orbit. The parapharyngeal fat planes are preserved. The nasopharynx, oropharynx and hypopharynx are unremarkable in appearance. The visualized portions of the valleculae and piriform sinuses are grossly unremarkable.  The parotid and submandibular glands are within normal limits. No cervical lymphadenopathy is seen.  CT CERVICAL SPINE FINDINGS  There is no evidence of acute fracture or subluxation. There is mild grade 1 anterolisthesis of C2 on C3, and diffuse disc space narrowing along the cervical spine, with scattered anterior and posterior disc osteophyte complexes, and underlying facet disease. There is suggestion of mild grade 1 anterolisthesis of C7 on T1. Vertebral bodies demonstrate normal height.  The thyroid gland is unremarkable in appearance.  The visualized lung apices are clear. Retropharyngeal common carotid arteries are seen. Scattered calcification is noted at the carotid bifurcations bilaterally.  IMPRESSION: 1. No evidence of traumatic intracranial injury or fracture. 2. No evidence of fracture or dislocation with regard to the maxillofacial structures. 3. No evidence of acute fracture or subluxation along the cervical spine. 4. Significant soft tissue swelling and laceration at the left posterior parietal calvarium, with a prominent focal 2.4 cm soft tissue hematoma. Soft tissue swelling noted lateral and superior to the left orbit. 5. Absence of the maxillary teeth is thought to be subacute in nature, though underlying defects are still seen. 6. Mild cortical volume loss and scattered small vessel ischemic microangiopathy. 7. Diffuse degenerative change along the cervical spine. 8. Scattered calcification at the carotid bifurcations bilaterally. Carotid ultrasound would be helpful for further evaluation, when and as deemed clinically appropriate.  Electronically Signed:  By: Roanna Raider M.D. On: 09/14/2014 01:35    Microbiology: No results found for this or any previous visit (from the past 240 hour(s)).   Labs: Basic Metabolic Panel:  Recent Labs Lab 09/14/14 0218 09/14/14 0440  NA 139 140  K 3.5 3.8  CL 100* 101  CO2 28 30  GLUCOSE 105* 88  BUN 11 11  CREATININE 0.73 0.73  CALCIUM 8.9 9.0   Liver Function Tests: No results for input(s): AST, ALT, ALKPHOS, BILITOT, PROT, ALBUMIN in the last 168 hours. No results for input(s): LIPASE, AMYLASE in the last 168 hours. No results for input(s): AMMONIA in the last 168 hours. CBC:  Recent Labs Lab 09/14/14 0218 09/14/14 0440  WBC 17.4* 17.6*  NEUTROABS 13.1*  --   HGB 13.2 12.8  HCT 39.9 39.7  MCV 89.3 88.0  PLT 144* 288   Cardiac Enzymes: No results for input(s): CKTOTAL, CKMB, CKMBINDEX, TROPONINI in the last 168 hours. BNP: BNP (last 3 results) No results  for input(s): BNP in the last 8760 hours.  ProBNP (last 3 results) No results for input(s): PROBNP in the last 8760 hours.  CBG: No results for input(s): GLUCAP in the last 168 hours.     SignedChristiane Ha  [facility] 09/16/2014, 12:42 PM

## 2014-09-16 NOTE — Progress Notes (Signed)
I met with pt and her spouse at bedside. We discussed Dr. Charm Barges recommendation for outpt therapy follow up. They are in agreement and spouse can provide transport. I will alert RN CM of recommendations. [phone]

## 2014-09-23 ENCOUNTER — Ambulatory Visit: Payer: Medicare Other | Attending: Internal Medicine | Admitting: Rehabilitative and Restorative Service Providers"

## 2014-09-23 DIAGNOSIS — R269 Unspecified abnormalities of gait and mobility: Secondary | ICD-10-CM | POA: Diagnosis present

## 2014-09-23 DIAGNOSIS — H8111 Benign paroxysmal vertigo, right ear: Secondary | ICD-10-CM | POA: Diagnosis present

## 2014-09-23 DIAGNOSIS — R531 Weakness: Secondary | ICD-10-CM

## 2014-09-23 NOTE — Therapy (Signed)
[facility] [address] [place], [place], [address] Phone: [phone]   Fax:  [phone]  Physical Therapy Evaluation  Patient Details  Name: Andrea Yu MRN: [id number] Date of Birth: 02/16/1940 Referring Provider:  Christiane Ha, MD  Encounter Date: 09/23/2014      PT End of Session - 09/23/14 2134    Visit Number 1   Number of Visits 16   Date for PT Re-Evaluation 11/23/14   Authorization Type G code required every 10th visit   PT Start Time 1110   PT Stop Time 1150   PT Time Calculation (min) 40 min   Equipment Utilized During Treatment Gait belt   Activity Tolerance Patient tolerated treatment well   Behavior During Therapy [facility] for tasks assessed/performed      Past Medical History  Diagnosis Date  . Hypertension   . Peripheral neuropathy     "from back OR in 2005; in hands and feet" (11/07/2012)  . Anemia   . History of blood transfusion     "after 1 of my knee ORs and today" (11/07/2012)  . A-fib     "just after knee 2nd OR" (11/07/2012)  . Psoriatic arthritis     on Prednisone Rx    Past Surgical History  Procedure Laterality Date  . Back surgery    . Replacement total knee bilateral Bilateral 2007  . Cholecystectomy  2000's  . Joint replacement    . Vaginal hysterectomy  1983  . Tubal ligation  1974  . Cataract extraction w/ intraocular lens  implant, bilateral Bilateral 2007  . Posterior lumbar fusion  2005    "got a rod and 4 pins in there" (11/07/2012)  . Colonoscopy N/A 11/10/2012    Procedure: COLONOSCOPY;  Surgeon: Theda Belfast, MD;  Location: [facility] ENDOSCOPY;  Service: Endoscopy;  Laterality: N/A;  . Esophagogastroduodenoscopy N/A 11/10/2012    Procedure: ESOPHAGOGASTRODUODENOSCOPY (EGD);  Surgeon: Theda Belfast, MD;  Location: [facility] ENDOSCOPY;  Service: Endoscopy;  Laterality: N/A;  . Transthoracic echocardiogram  05/24/2006    There were no vitals filed for this  visit.  Visit Diagnosis:  BPPV (benign paroxysmal positional vertigo), right  Abnormality of gait  Generalized weakness      Subjective Assessment - 09/23/14 1115    Subjective The patient is s/p a fall with subarachnoid hemorrhage on 09/13/14.  She was hospitalized x 4 days and then d/c home.  She reports that she is continuing with dizziness since recent fall.  Prior level of function was ambulating short distances with RW on level surfaces indoors and scooter when in community.     Patient is accompained by: Family member  husband   Pertinent History peripheral neuropathy, h/o back surgery 2005, a-fib.  Primary care MD:  Jarome Matin, MD   Patient Stated Goals "To walk again".    Currently in Pain? No/denies  at rest, however notes low back pain with standing.              [facility] PT Assessment - 09/23/14 1122    Assessment   Medical Diagnosis subarachnoid hemorrhage   Onset Date/Surgical Date 09/13/14   Prior Therapy acute   Precautions   Precautions Fall   Balance Screen   Has the patient fallen in the past 6 months Yes   How many times? --  2 this year- both indoors and both backwards   Has the patient had a decrease in activity level because of a fear of  falling?  Yes   Is the patient reluctant to leave their home because of a fear of falling?  Yes   Home Environment   Living Environment Private residence   Living Arrangements Spouse/significant other   Type of Home House   Home Access Ramped entrance   Home Layout One level   Sales promotion account executive scooter;Bedside commode;Walker - 2 wheels;Grab bars - tub/shower  stool in shower   Prior Function   Level of Independence --  set up level of assist for ADLs, stands for short timeframe   Observation/Other Assessments   Skin Integrity Patient has dried blood and minimal bloody drainage noted from laceration on L posterior head region s/p recent fall   Sensation   Light Touch Impaired Detail  iminished light  touch in sock distribution   ROM / Strength   AROM / PROM / Strength AROM;Strength   AROM   Overall AROM  Deficits   Overall AROM Comments Pt with limited UE use due to arthritis in shoulders with <45 degrees shoulder flexion, hip A/ROM appears limited in extension per observation with gait   Strength   Overall Strength Deficits   Overall Strength Comments full strength assessment not completed due to time constraints, will focus on strength needed to optimize functional mobility   Bed Mobility   Bed Mobility Sit to Supine   Sit to Supine 4: Min guard;4: Min assist  min A R sidelying >sit and CGA L sidelying>sit   Transfers   Transfers Sit to Stand   Sit to Stand 4: Min guard   Sit to Stand Details --  CGA for safety during all standing tasks and mobility   Ambulation/Gait   Ambulation/Gait Yes   Ambulation/Gait Assistance 4: Min assist   Ambulation Distance (Feet) 20 Feet   Assistive device Rolling walker   Gait Pattern Decreased stride length;Trunk flexed  increased weight supported through UEs            Vestibular Assessment - 09/23/14 2124    Symptom Behavior   Type of Dizziness Spinning   Frequency of Dizziness --  intermittent   Duration of Dizziness --  seconds   Aggravating Factors --  Pt reports certain positions provoke dizziness   Relieving Factors Head stationary   Positional Testing   Sidelying Test Sidelying Right;Sidelying Left  PT chose sidelying test due to recent head trauma   Horizontal Canal Testing Horizontal Canal Right;Horizontal Canal Left   Sidelying Right   Sidelying Right Duration 10-20 sec   Sidelying Right Symptoms Upbeat, right rotatory nystagmus  indicating R posterior canalithiasis   Sidelying Left   Sidelying Left Symptoms No nystagmus   Horizontal Canal Right   Horizontal Canal Right Symptoms Normal   Horizontal Canal Left   Horizontal Canal Left Symptoms --  ?possibly indicating R horizontal cupulolithiasis            Vestibular Treatment/Exercise - 09/23/14 2148    Vestibular Treatment/Exercise   Vestibular Treatment Provided Canalith Repositioning   Canalith Repositioning Epley Manuever Right    EPLEY MANUEVER RIGHT   Number of Reps  --  1   Overall Response --  Modified technique using pillow under upper back    Response Details  --  patient tolerated well, will reassess next visit            PT Education - 09/23/14 2133    Education provided Yes   Education Details Educated on nature of BPPV    Person(s) Educated Patient;Spouse  Methods Explanation;Demonstration;Handout   Comprehension Verbalized understanding;Returned demonstration          PT Short Term Goals - 09/23/14 2135    PT SHORT TERM GOAL #1   Title The patient will be independent with HEP for habituation, general mobility, and balance.   Baseline Target date 10/24/2014   Time 4   Period Weeks   PT SHORT TERM GOAL #2   Title The patient will have negative R sidelying test indicating resolution of R BPPV.   Baseline Target date 10/24/2014   Time 4   Period Weeks   PT SHORT TERM GOAL #3   Title The patient will move sit<>stand modified indep to RW.   Baseline Target date 10/24/2014   Time 4   Period Weeks   PT SHORT TERM GOAL #4   Title The patient will ambulate with supervision with RW x 100 ft in order to return to household ambulation.   Baseline Target date 10/24/2014   Time 4   Period Weeks   PT SHORT TERM GOAL #5   Title The patient will be further assessed on Berg, gait speed once vertigo treated and LTGs to follow.   Baseline Target date 10/24/2014   Time 4   Period Weeks           PT Long Term Goals - 09/23/14 2137    PT LONG TERM GOAL #1   Title The patient will be independent with progression of HEP for post d/c activity.   Baseline Target date 11/23/2014   Time 8   Period Weeks   PT LONG TERM GOAL #2   Title The patient will improve Berg score by 6 points to demonstrate improved steady state  balance for ADLs.   Baseline Target date 11/23/2014   Time 8   Period Weeks   PT LONG TERM GOAL #3   Title The patient will improve gait speed by 0.5 ft/sec to demonstrate improved functional ambulation for household surfaces.   Baseline Target date 11/23/2014   Time 8   Period Weeks   PT LONG TERM GOAL #4   Title The patient will ambulate x 200 feet nonstop with RW modified indep.   Baseline Target date 11/23/2014   Time 8   Period Weeks   PT LONG TERM GOAL #5   Title The patient will verbalize understanding of fall prevention strategies for home safety.   Baseline Target date 11/23/2014   Time 8   Period Weeks               Plan - 09/23/14 2141    Clinical Impression Statement The patient is a 74 yo female s/p recent fall with SDH and onset of dizziness.  Prior to her recent fall, she had declining mobility noting difficulty performing ADLs due to imbalance, weakness, and worsening shoulder mobility.  PT to focus on treating vertigo, improving general safety with mobility, and progressing activities to optimize current functional status.  With positional testing for vertigo today, the patient appears to have multicanal BPPV with R posterior canalithiasis and possible horizontal canal involvement.  PT initiated treatment of R side today (with modifications due to head laceration) and will reassess and treat as indicated.   Pt will benefit from skilled therapeutic intervention in order to improve on the following deficits Abnormal gait;Decreased balance;Decreased mobility;Decreased strength;Decreased range of motion;Difficulty walking;Dizziness;Impaired UE functional use;Decreased endurance   Rehab Potential Good   PT Frequency 2x / week   PT Duration 8 weeks  PT Treatment/Interventions ADLs/Self Care Home Management;Balance training;Neuromuscular re-education;Vestibular;Therapeutic exercise;Therapeutic activities;Functional mobility training;Gait training;Patient/family  education;Orthotic Fit/Training   PT Next Visit Plan Check BPPV and treat as indicated (modify due to laceration as needed), provide HEP (begin with [facility] for fall reduction), check Berg and 51M as able.   Recommended Other Services Patient would benefit from OT due to declining A/ROM and functional use of bilateral UEs during ADLs (prior to recent fall).   Consulted and Agree with Plan of Care Patient;Family member/caregiver   Family Member Consulted spouse         Problem List Patient Active Problem List   Diagnosis Date Noted  . SAH (subarachnoid hemorrhage) 09/14/2014  . A-fib 09/14/2014  . Hypertension 09/14/2014  . Laceration of eyebrow, left 09/14/2014  . Fall 09/14/2014  . Gastritis, acute 11/11/2012  . Cellulitis of leg, right 11/11/2012  . Obesity (BMI 30-39.9)- suspected sleep apnea, declines sleep study 11/10/2012  . Psoriatic arthritis 11/06/2012  . Iron deficiency anemia- transfused this admission 11/06/2012  . Depression 11/06/2012  . Peripheral neuropathy 11/06/2012  . Shock circulatory on admission 11/06/12 11/06/2012  . Systemic inflammatory response syndrome (SIRS) 11/06/2012  . Nausea and vomiting 08/09/2012  . Adrenal insufficiency 08/09/2012  . PAF (paroxysmal atrial fibrillation) 08/09/2012  . Hypotension 08/09/2012  . Dehydration 08/09/2012    Malvika Tung, PT 09/23/2014, 9:49 PM  [facility] [address] [place], [place], [address] Phone: [phone]   Fax:  [phone]

## 2014-09-24 ENCOUNTER — Ambulatory Visit: Payer: Medicare Other | Admitting: Rehabilitative and Restorative Service Providers"

## 2014-09-24 DIAGNOSIS — H8111 Benign paroxysmal vertigo, right ear: Secondary | ICD-10-CM | POA: Diagnosis not present

## 2014-09-24 DIAGNOSIS — R269 Unspecified abnormalities of gait and mobility: Secondary | ICD-10-CM

## 2014-09-24 NOTE — Therapy (Signed)
[facility] [address] [place], [place], [address] Phone: [phone]   Fax:  [phone]  Physical Therapy Treatment  Patient Details  Name: Andrea Yu MRN: [id number] Date of Birth: 1940/07/28 Referring Provider:  Jarome Matin, MD  Encounter Date: 09/24/2014      PT End of Session - 09/24/14 2150    Visit Number 2   Number of Visits 16   Date for PT Re-Evaluation 11/23/14   Authorization Type G code required every 10th visit   PT Start Time 1238   PT Stop Time 1320   PT Time Calculation (min) 42 min   Equipment Utilized During Treatment Gait belt   Activity Tolerance Patient tolerated treatment well   Behavior During Therapy [facility] for tasks assessed/performed      Past Medical History  Diagnosis Date  . Hypertension   . Peripheral neuropathy     "from back OR in 2005; in hands and feet" (11/07/2012)  . Anemia   . History of blood transfusion     "after 1 of my knee ORs and today" (11/07/2012)  . A-fib     "just after knee 2nd OR" (11/07/2012)  . Psoriatic arthritis     on Prednisone Rx    Past Surgical History  Procedure Laterality Date  . Back surgery    . Replacement total knee bilateral Bilateral 2007  . Cholecystectomy  2000's  . Joint replacement    . Vaginal hysterectomy  1983  . Tubal ligation  1974  . Cataract extraction w/ intraocular lens  implant, bilateral Bilateral 2007  . Posterior lumbar fusion  2005    "got a rod and 4 pins in there" (11/07/2012)  . Colonoscopy N/A 11/10/2012    Procedure: COLONOSCOPY;  Surgeon: Theda Belfast, MD;  Location: [facility] ENDOSCOPY;  Service: Endoscopy;  Laterality: N/A;  . Esophagogastroduodenoscopy N/A 11/10/2012    Procedure: ESOPHAGOGASTRODUODENOSCOPY (EGD);  Surgeon: Theda Belfast, MD;  Location: [facility] ENDOSCOPY;  Service: Endoscopy;  Laterality: N/A;  . Transthoracic echocardiogram  05/24/2006    There were no vitals filed for this  visit.  Visit Diagnosis:  Abnormality of gait  BPPV (benign paroxysmal positional vertigo), right      Subjective Assessment - 09/24/14 1237    Subjective The patient did not notice any reduction in symptoms since treatment yesterday.  She is fatigued today and didn't sleep well b/c of needing to use restroom frequently t/o the night.    Currently in Pain? No/denies            [facility] PT Assessment - 09/24/14 2152    Ambulation/Gait   Ambulation/Gait Yes   Ambulation/Gait Assistance 4: Min guard   Ambulation Distance (Feet) 60 Feet   Assistive device Rolling walker   Gait velocity 0.97 ft/sec   Standardized Balance Assessment   Standardized Balance Assessment Berg Balance Test   Berg Balance Test   Sit to Stand Needs minimal aid to stand or to stabilize   Standing Unsupported Able to stand 2 minutes with supervision   Sitting with Back Unsupported but Feet Supported on Floor or Stool Able to sit safely and securely 2 minutes   Stand to Sit Uses backs of legs against chair to control descent   Transfers Needs one person to assist   Standing Unsupported with Eyes Closed Needs help to keep from falling   Standing Ubsupported with Feet Together Needs help to attain position and unable to hold for 15 seconds  From Standing, Reach Forward with Outstretched Arm Loses balance while trying/requires external support   From Standing Position, Pick up Object from Floor Unable to try/needs assist to keep balance   From Standing Position, Turn to Look Behind Over each Shoulder Needs assist to keep from losing balance and falling   Turn 360 Degrees Needs assistance while turning   Standing Unsupported, Alternately Place Feet on Step/Stool Needs assistance to keep from falling or unable to try   Standing Unsupported, One Foot in Front Loses balance while stepping or standing   Standing on One Leg Unable to try or needs assist to prevent fall   Total Score 11            Vestibular  Assessment - 09/24/14 2201    Positional Testing   Sidelying Test Sidelying Right;Sidelying Left   Horizontal Canal Testing Horizontal Canal Right;Horizontal Canal Left   Sidelying Right   Sidelying Right Symptoms No nystagmus   Sidelying Left   Sidelying Left Symptoms No nystagmus   Horizontal Canal Right   Horizontal Canal Right Symptoms Normal   Horizontal Canal Left   Horizontal Canal Left Symptoms Normal  however notes a trace of subjective dizziness                 [facility] Adult PT Treatment/Exercise - 09/24/14 2152    Transfers   Transfers Sit to Stand   Sit to Stand 5: Supervision;4: Min guard   Sit to Stand Details --  patient fatigues requiring greater assist after 2 reps   Comments provided for HEP x 3 reps for LE strengthening, reviewed use of UEs on mat/armrest vs. use of RW   Exercises   Exercises --  seated ankle pumps x 10 reps R and L knees extended         Vestibular Treatment/Exercise - 09/24/14 0001    Vestibular Treatment/Exercise   Vestibular Treatment Provided Gaze   Gaze Exercises X1 Viewing Horizontal   X1 Viewing Horizontal   Foot Position --  seated   Time --  30 seconds   Reps 1   Comments --  with cues on tolerable neck ROM               PT Education - 09/24/14 2149    Education provided Yes   Education Details HEP: sit<>stand, ankle pumps, gaze x 1 viewing.   Person(s) Educated Patient;Spouse   Methods Explanation;Demonstration;Handout   Comprehension Returned demonstration;Verbalized understanding          PT Short Term Goals - 09/23/14 2135    PT SHORT TERM GOAL #1   Title The patient will be independent with HEP for habituation, general mobility, and balance.   Baseline Target date 10/24/2014   Time 4   Period Weeks   PT SHORT TERM GOAL #2   Title The patient will have negative R sidelying test indicating resolution of R BPPV.   Baseline Target date 10/24/2014   Time 4   Period Weeks   PT SHORT TERM GOAL #3    Title The patient will move sit<>stand modified indep to RW.   Baseline Target date 10/24/2014   Time 4   Period Weeks   PT SHORT TERM GOAL #4   Title The patient will ambulate with supervision with RW x 100 ft in order to return to household ambulation.   Baseline Target date 10/24/2014   Time 4   Period Weeks   PT SHORT TERM GOAL #5   Title The  patient will be further assessed on Berg, gait speed once vertigo treated and LTGs to follow.   Baseline Target date 10/24/2014   Time 4   Period Weeks           PT Long Term Goals - 10/22/2014 2135/06/15    PT LONG TERM GOAL #1   Title The patient will be independent with progression of HEP for post d/c activity.   Baseline Target date 11/23/2014   Time 8   Period Weeks   PT LONG TERM GOAL #2   Title The patient will improve Berg score by 6 points to demonstrate improved steady state balance for ADLs.   Baseline Target date 11/23/2014   Time 8   Period Weeks   PT LONG TERM GOAL #3   Title The patient will improve gait speed by 0.5 ft/sec to demonstrate improved functional ambulation for household surfaces.   Baseline Target date 11/23/2014   Time 8   Period Weeks   PT LONG TERM GOAL #4   Title The patient will ambulate x 200 feet nonstop with RW modified indep.   Baseline Target date 11/23/2014   Time 8   Period Weeks   PT LONG TERM GOAL #5   Title The patient will verbalize understanding of fall prevention strategies for home safety.   Baseline Target date 11/23/2014   Time 8   Period Weeks               Plan - 09/24/14 14-Jun-2149    Clinical Impression Statement The patient had negative R positional testing today indicating resolution of R posterior canal BPPV.  She has a trace of dizziness, but no nystagmus noted today with L rolling.  Further treatment not performed on this positional movement due to sore on L posterior head from fall.  The patient is at high risk for falls per Berg score of 11/56 and gait speed of 0.97  ft/sec.   PT Next Visit Plan HEP (begin [place] to patient tolerance), balance, gait, and treat BPPV as needed.   Consulted and Agree with Plan of Care Patient          G-Codes - 22-Oct-2014 2148-06-14    Functional Assessment Tool Used R BPPV, needs assistance to ambulate   Functional Limitation Mobility: Walking and moving around   Mobility: Walking and Moving Around Current Status [phone]) At least 40 percent but less than 60 percent impaired, limited or restricted   Mobility: Walking and Moving Around Goal Status [phone]) At least 20 percent but less than 40 percent impaired, limited or restricted      Problem List Patient Active Problem List   Diagnosis Date Noted  . SAH (subarachnoid hemorrhage) 09/14/2014  . A-fib 09/14/2014  . Hypertension 09/14/2014  . Laceration of eyebrow, left 09/14/2014  . Fall 09/14/2014  . Gastritis, acute 11/11/2012  . Cellulitis of leg, right 11/11/2012  . Obesity (BMI 30-39.9)- suspected sleep apnea, declines sleep study 11/10/2012  . Psoriatic arthritis 11/06/2012  . Iron deficiency anemia- transfused this admission 11/06/2012  . Depression 11/06/2012  . Peripheral neuropathy 11/06/2012  . Shock circulatory on admission 11/06/12 11/06/2012  . Systemic inflammatory response syndrome (SIRS) 11/06/2012  . Nausea and vomiting 08/09/2012  . Adrenal insufficiency 08/09/2012  . PAF (paroxysmal atrial fibrillation) 08/09/2012  . Hypotension 08/09/2012  . Dehydration 08/09/2012    Jackson Coffield, PT 09/24/2014, 10:03 PM  [facility] [address] [place], [place], [address] Phone: [phone]   Fax:  [phone]      

## 2014-09-24 NOTE — Patient Instructions (Signed)
Gaze Stabilization: Sitting   Keeping eyes on target on wall 3 feet away, tilt head down slightly and move head side to side for __30__ seconds. Repeat while moving head up and down for _30___ seconds. Do _2___ sessions per day.  Copyright  VHI. All rights reserved.   Ankle Bend (Dorsiflexion and Plantar Flexion)   Sitting kick one leg out in front and pump ankles up and down.  10 times each leg, 2 times/day.   http://gt2.exer.us/404   Copyright  VHI. All rights reserved.   Functional Quadriceps: Sit to Stand   Sit on edge of chair, feet flat on floor. Stand upright, extending knees fully.  *HAVE WALKER IN FRONT OF YOU* Repeat __3-5__ times per set. Do __1__ sets per session. Do __2__ sessions per day.  http://orth.exer.us/735   Copyright  VHI. All rights reserved.

## 2014-10-02 ENCOUNTER — Ambulatory Visit: Payer: Medicare Other | Attending: Internal Medicine | Admitting: Rehabilitative and Restorative Service Providers"

## 2014-10-02 ENCOUNTER — Telehealth: Payer: Self-pay | Admitting: Rehabilitative and Restorative Service Providers"

## 2014-10-02 DIAGNOSIS — R531 Weakness: Secondary | ICD-10-CM | POA: Diagnosis present

## 2014-10-02 DIAGNOSIS — M6289 Other specified disorders of muscle: Secondary | ICD-10-CM | POA: Diagnosis present

## 2014-10-02 DIAGNOSIS — R201 Hypoesthesia of skin: Secondary | ICD-10-CM | POA: Insufficient documentation

## 2014-10-02 DIAGNOSIS — Z7409 Other reduced mobility: Secondary | ICD-10-CM | POA: Insufficient documentation

## 2014-10-02 DIAGNOSIS — M25519 Pain in unspecified shoulder: Secondary | ICD-10-CM | POA: Insufficient documentation

## 2014-10-02 DIAGNOSIS — M25619 Stiffness of unspecified shoulder, not elsewhere classified: Secondary | ICD-10-CM | POA: Insufficient documentation

## 2014-10-02 DIAGNOSIS — R269 Unspecified abnormalities of gait and mobility: Secondary | ICD-10-CM | POA: Diagnosis present

## 2014-10-02 NOTE — Therapy (Signed)
[facility] [address] [place], [place], [address] Phone: [phone]   Fax:  [phone]  Physical Therapy Treatment  Patient Details  Name: Andrea Yu MRN: [id number] Date of Birth: 1940-12-15 Referring Provider:  Leanna Battles, MD  Encounter Date: 10/02/2014      PT End of Session - 10/02/14 1337    Visit Number 3   Number of Visits 16   Date for PT Re-Evaluation 11/23/14   Authorization Type G code required every 10th visit   PT Start Time 1238   PT Stop Time 1332   PT Time Calculation (min) 54 min   Equipment Utilized During Treatment Gait belt   Activity Tolerance Patient tolerated treatment well   Behavior During Therapy [facility] for tasks assessed/performed      Past Medical History  Diagnosis Date  . Hypertension   . Peripheral neuropathy     "from back OR in 2005; in hands and feet" (11/07/2012)  . Anemia   . History of blood transfusion     "after 1 of my knee ORs and today" (11/07/2012)  . A-fib     "just after knee 2nd OR" (11/07/2012)  . Psoriatic arthritis     on Prednisone Rx    Past Surgical History  Procedure Laterality Date  . Back surgery    . Replacement total knee bilateral Bilateral 2007  . Cholecystectomy  2000's  . Joint replacement    . Vaginal hysterectomy  1983  . Tubal ligation  1974  . Cataract extraction w/ intraocular lens  implant, bilateral Bilateral 2007  . Posterior lumbar fusion  2005    "got a rod and 4 pins in there" (11/07/2012)  . Colonoscopy N/A 11/10/2012    Procedure: COLONOSCOPY;  Surgeon: Beryle Beams, MD;  Location: [facility];  Service: Endoscopy;  Laterality: N/A;  . Esophagogastroduodenoscopy N/A 11/10/2012    Procedure: ESOPHAGOGASTRODUODENOSCOPY (EGD);  Surgeon: Beryle Beams, MD;  Location: [facility] ENDOSCOPY;  Service: Endoscopy;  Laterality: N/A;  . Transthoracic echocardiogram  05/24/2006    There were no vitals filed for this  visit.  Visit Diagnosis:  Abnormality of gait  Generalized weakness      Subjective Assessment - 10/02/14 1240    Subjective The patient reports she took 2 steps backwards yesterday.  She states she was tired from negotiating  step out onto deck and woke up yesterday and legs gave way.  She needed her husband's help to get back up.  Then in the middle of the night, she fell again when trying to get out of bed (she couldn't wake her husband up to help).  She reports dizziness is resolved since treatment here.  She is not too sore in her legs from the falls.   Pertinent History peripheral neuropathy, h/o back surgery 2005, a-fib.  Primary care MD:  Leanna Battles, MD   Patient Stated Goals "To walk again".    Currently in Pain? Yes   Pain Location --  arms are sore from walking at home with walker   Pain Descriptors / Indicators Aching   Pain Type Acute pain   Pain Onset In the past 7 days   Pain Frequency Intermittent            [facility] Adult PT Treatment/Exercise - 10/02/14 1344    Transfers   Transfers Sit to Stand;Stand to [employer] Transfers   Sit to Stand 5: Supervision  to RW mimicing home set-up of bed>bedside commode  Sit to Stand Details Verbal cues for technique;Visual cues/gestures for precautions/safety   Stand to Sit 5: Supervision  discussed backing all the way up to commode before sit   Stand to Sit Details (indicate cue type and reason) Verbal cues for technique;Verbal cues for precautions/safety   Stand Pivot Transfers 5: Supervision;4: Min guard   Number of Reps --  3 reps for bed<>bedside commode   Comments focusing on home safety to reduce falls   Ambulation/Gait   Ambulation/Gait Yes   Ambulation/Gait Assistance 4: Min guard   Ambulation Distance (Feet) --  30   Assistive device Rolling walker   Gait Pattern Decreased stride length;Right foot flat;Left foot flat   Exercises   Exercises Shoulder   Shoulder Exercises: Supine   Flexion AAROM;5  reps;Both  with cane for moblity   Shoulder Exercises: Seated   Horizontal ABduction AAROM;Both  seated with cane for ROM/mobility             Balance Exercises - 10/02/14 1343    [place] PROGRAM   Head Movements Standing;5 reps  UE support at countertop   Neck Movements Standing;5 reps  UE support at countertop   Trunk Movements Standing;5 reps  UE support   Ankle Movements Sitting;10 reps   Knee Extensor 10 reps   Sideways Walking Assistive device   Sit to Stand 10 reps, bilateral support           PT Education - 10/02/14 1336    Education provided Yes   Education Details HEP: instructed in [place] exercises of neck rotation, chin tucks, trunk rotation, ankle pumps, long arc quad, sidestepping, sit<>stand.   Person(s) Educated Patient   Methods Explanation;Demonstration;Handout   Comprehension Verbalized understanding;Returned demonstration          PT Short Term Goals - 10/02/14 1337    PT SHORT TERM GOAL #1   Title The patient will be independent with HEP for habituation, general mobility, and balance.   Baseline Target date 10/24/2014   Time 4   Period Weeks   Status On-going   PT SHORT TERM GOAL #2   Title The patient will have negative R sidelying test indicating resolution of R BPPV.   Baseline Met on 09/24/2014 with patient feeling vertigo resolved.   Time 4   Period Weeks   Status Achieved   PT SHORT TERM GOAL #3   Title The patient will move sit<>stand modified indep to RW.   Baseline Met on 10/02/2014   Time 4   Period Weeks   Status Achieved   PT SHORT TERM GOAL #4   Title The patient will ambulate with supervision with RW x 100 ft in order to return to household ambulation.   Baseline Target date 10/24/2014   Time 4   Period Weeks   Status On-going   PT SHORT TERM GOAL #5   Title The patient will be further assessed on Berg, gait speed once vertigo treated and LTGs to follow.   Baseline Met on 09/24/2014 with [name] 11/56 and gait speed=0.97  ft/sec    Time 4   Period Weeks   Status Achieved           PT Long Term Goals - 09/23/14 2137    PT LONG TERM GOAL #1   Title The patient will be independent with progression of HEP for post d/c activity.   Baseline Target date 11/23/2014   Time 8   Period Weeks   PT LONG TERM GOAL #2  Title The patient will improve Berg score by 6 points to demonstrate improved steady state balance for ADLs.   Baseline Target date 11/23/2014   Time 8   Period Weeks   PT LONG TERM GOAL #3   Title The patient will improve gait speed by 0.5 ft/sec to demonstrate improved functional ambulation for household surfaces.   Baseline Target date 11/23/2014   Time 8   Period Weeks   PT LONG TERM GOAL #4   Title The patient will ambulate x 200 feet nonstop with RW modified indep.   Baseline Target date 11/23/2014   Time 8   Period Weeks   PT LONG TERM GOAL #5   Title The patient will verbalize understanding of fall prevention strategies for home safety.   Baseline Target date 11/23/2014   Time 8   Period Weeks               Plan - 10/02/14 1339    Clinical Impression Statement The patient reports 2 falls over the past 2 days when transferring bed<>bedside commode.  PT reviewed the patient's current home set-up and discussed ways to make this safer (hold the RW until she is backed up against bedside commode and  changed position of the chair).  The patient feels that L knee giving way is reason for recent falls.  She was provided a knee immobilizer by ortho MD prior to hospital admission, but feels that it slides down her leg when using and makes it too difficult to move sit<>stand.  PT to continue working on safety with mobility to reduce falls in the home.   Pt will benefit from skilled therapeutic intervention in order to improve on the following deficits Abnormal gait;Decreased balance;Decreased mobility;Decreased strength;Decreased range of motion;Difficulty walking;Dizziness;Impaired UE  functional use;Decreased endurance   Rehab Potential Good   PT Frequency 2x / week   PT Duration 8 weeks   PT Treatment/Interventions ADLs/Self Care Home Management;Balance training;Neuromuscular re-education;Vestibular;Therapeutic exercise;Therapeutic activities;Functional mobility training;Gait training;Patient/family education;Orthotic Fit/Training   PT Next Visit Plan Check [place] balance home program for fall reduction, progress gait to tolerance, LE strengthening, general mobility/safety.   Recommended Other Services OT for A/ROM and to prevent further decline of UE use.  Pt c/o pain with holding walker.   Consulted and Agree with Plan of Care Patient        Problem List Patient Active Problem List   Diagnosis Date Noted  . SAH (subarachnoid hemorrhage) 09/14/2014  . A-fib 09/14/2014  . Hypertension 09/14/2014  . Laceration of eyebrow, left 09/14/2014  . Fall 09/14/2014  . Gastritis, acute 11/11/2012  . Cellulitis of leg, right 11/11/2012  . Obesity (BMI 30-39.9)- suspected sleep apnea, declines sleep study 11/10/2012  . Psoriatic arthritis 11/06/2012  . Iron deficiency anemia- transfused this admission 11/06/2012  . Depression 11/06/2012  . Peripheral neuropathy 11/06/2012  . Shock circulatory on admission 11/06/12 11/06/2012  . Systemic inflammatory response syndrome (SIRS) 11/06/2012  . Nausea and vomiting 08/09/2012  . Adrenal insufficiency 08/09/2012  . PAF (paroxysmal atrial fibrillation) 08/09/2012  . Hypotension 08/09/2012  . Dehydration 08/09/2012    Chamya Hunton, PT 10/02/2014, 1:52 PM  [facility] [address] [place], [place], [address] Phone: [phone]   Fax:  [phone]

## 2014-10-02 NOTE — Telephone Encounter (Signed)
Andrea Yu reports 2 falls this week during transfers at home from bed<>bedside commode when L knee gave way.  She requires assist to stand.  She has bruising noted on the knees and soreness in arms, but no other injuries per her report.   Patient would benefit from OT services to focus on UEs and maintaining current ROM to prevent further decline in use of UEs.  She c/o fatigue in arms when walking and having to weight bear through UEs. Please submit order via EPIC or fax to [facility] [phone] if you agree.  PT progressing gait/mobility to patient tolerance. Thank you for the referral of this patient. Margretta Ditty, MPT

## 2014-10-03 ENCOUNTER — Ambulatory Visit: Payer: Medicare Other | Admitting: Rehabilitative and Restorative Service Providers"

## 2014-10-04 ENCOUNTER — Ambulatory Visit: Payer: Medicare Other | Admitting: Rehabilitative and Restorative Service Providers"

## 2014-10-04 DIAGNOSIS — R269 Unspecified abnormalities of gait and mobility: Secondary | ICD-10-CM

## 2014-10-04 DIAGNOSIS — R531 Weakness: Secondary | ICD-10-CM

## 2014-10-04 NOTE — Therapy (Signed)
[facility] [address] [place], [place], [address] Phone: [phone]   Fax:  [phone]  Physical Therapy Treatment  Patient Details  Name: Andrea Yu MRN: [id number] Date of Birth: 26-Sep-1940 Referring Provider:  Leanna Battles, MD  Encounter Date: 10/04/2014      PT End of Session - 10/04/14 1015    Visit Number 4   Number of Visits 16   Date for PT Re-Evaluation 11/23/14   Authorization Type G code required every 10th visit   PT Start Time 1016   PT Stop Time 1100   PT Time Calculation (min) 44 min   Equipment Utilized During Treatment Gait belt   Activity Tolerance Patient tolerated treatment well   Behavior During Therapy [facility] for tasks assessed/performed      Past Medical History  Diagnosis Date  . Hypertension   . Peripheral neuropathy     "from back OR in 2005; in hands and feet" (11/07/2012)  . Anemia   . History of blood transfusion     "after 1 of my knee ORs and today" (11/07/2012)  . A-fib     "just after knee 2nd OR" (11/07/2012)  . Psoriatic arthritis     on Prednisone Rx    Past Surgical History  Procedure Laterality Date  . Back surgery    . Replacement total knee bilateral Bilateral 2007  . Cholecystectomy  2000's  . Joint replacement    . Vaginal hysterectomy  1983  . Tubal ligation  1974  . Cataract extraction w/ intraocular lens  implant, bilateral Bilateral 2007  . Posterior lumbar fusion  2005    "got a rod and 4 pins in there" (11/07/2012)  . Colonoscopy N/A 11/10/2012    Procedure: COLONOSCOPY;  Surgeon: Beryle Beams, MD;  Location: [facility];  Service: Endoscopy;  Laterality: N/A;  . Esophagogastroduodenoscopy N/A 11/10/2012    Procedure: ESOPHAGOGASTRODUODENOSCOPY (EGD);  Surgeon: Beryle Beams, MD;  Location: [facility] ENDOSCOPY;  Service: Endoscopy;  Laterality: N/A;  . Transthoracic echocardiogram  05/24/2006    There were no vitals filed for this  visit.  Visit Diagnosis:  Abnormality of gait  Generalized weakness      Subjective Assessment - 10/04/14 1022    Subjective The patient reports fatigued since leaving hospital.  She feels it is the psoriatic arthritis.  She is sore in her arms from trying new exercises.   Patient Stated Goals "To walk again".    Currently in Pain? Yes   Pain Score --  "pretty sore"   Pain Location Shoulder   Pain Orientation Left;Right   Pain Descriptors / Indicators Aching   Pain Onset In the past 7 days   Pain Frequency Intermittent              [facility] Adult PT Treatment/Exercise - 10/04/14 1026    Ambulation/Gait   Ambulation/Gait Yes   Ambulation/Gait Assistance 4: Min assist   Ambulation Distance (Feet) 60 Feet  rest, then 75 feet    Assistive device Rolling walker   Gait Pattern Decreased stride length;Right foot flat;Left foot flat   Exercises   Exercises Knee/Hip;Ankle   Knee/Hip Exercises: Seated   Sit to [place] 5 reps;with UE support  with CGA for safety   Knee/Hip Exercises: Supine   Bridges AROM;Strengthening;Both;10 reps   Straight Leg Raises AROM;Strengthening;Right;Left;10 reps  with eccentric holds   Knee/Hip Exercises: Sidelying   Hip ABduction AROM;Right;Left;10 reps  with minimal assist + tactile cues for technique  Ankle Exercises: Stretches   Gastroc Stretch 3 reps  seated with belt emphasizing eversion                  PT Short Term Goals - 10/02/14 1337    PT SHORT TERM GOAL #1   Title The patient will be independent with HEP for habituation, general mobility, and balance.   Baseline Target date 10/24/2014   Time 4   Period Weeks   Status On-going   PT SHORT TERM GOAL #2   Title The patient will have negative R sidelying test indicating resolution of R BPPV.   Baseline Met on 09/24/2014 with patient feeling vertigo resolved.   Time 4   Period Weeks   Status Achieved   PT SHORT TERM GOAL #3   Title The patient will move sit<>stand  modified indep to RW.   Baseline Met on 10/02/2014   Time 4   Period Weeks   Status Achieved   PT SHORT TERM GOAL #4   Title The patient will ambulate with supervision with RW x 100 ft in order to return to household ambulation.   Baseline Target date 10/24/2014   Time 4   Period Weeks   Status On-going   PT SHORT TERM GOAL #5   Title The patient will be further assessed on Berg, gait speed once vertigo treated and LTGs to follow.   Baseline Met on 09/24/2014 with [name] 11/56 and gait speed=0.97 ft/sec    Time 4   Period Weeks   Status Achieved           PT Long Term Goals - 09/23/14 2137    PT LONG TERM GOAL #1   Title The patient will be independent with progression of HEP for post d/c activity.   Baseline Target date 11/23/2014   Time 8   Period Weeks   PT LONG TERM GOAL #2   Title The patient will improve Berg score by 6 points to demonstrate improved steady state balance for ADLs.   Baseline Target date 11/23/2014   Time 8   Period Weeks   PT LONG TERM GOAL #3   Title The patient will improve gait speed by 0.5 ft/sec to demonstrate improved functional ambulation for household surfaces.   Baseline Target date 11/23/2014   Time 8   Period Weeks   PT LONG TERM GOAL #4   Title The patient will ambulate x 200 feet nonstop with RW modified indep.   Baseline Target date 11/23/2014   Time 8   Period Weeks   PT LONG TERM GOAL #5   Title The patient will verbalize understanding of fall prevention strategies for home safety.   Baseline Target date 11/23/2014   Time 8   Period Weeks               Plan - 10/04/14 1215    Clinical Impression Statement The patient tolerated LE strengthening and gait well today. PT adding UE/posture activities to tolerance in order to progress ambulation (arms fatigue quickly when holding RW).   PT Next Visit Plan Check [place] balance home program for fall reduction, progress gait to tolerance, LE strengthening, general mobility/safety.    Consulted and Agree with Plan of Care Patient        Problem List Patient Active Problem List   Diagnosis Date Noted  . SAH (subarachnoid hemorrhage) 09/14/2014  . A-fib 09/14/2014  . Hypertension 09/14/2014  . Laceration of eyebrow, left 09/14/2014  . Fall 09/14/2014  .  Gastritis, acute 11/11/2012  . Cellulitis of leg, right 11/11/2012  . Obesity (BMI 30-39.9)- suspected sleep apnea, declines sleep study 11/10/2012  . Psoriatic arthritis 11/06/2012  . Iron deficiency anemia- transfused this admission 11/06/2012  . Depression 11/06/2012  . Peripheral neuropathy 11/06/2012  . Shock circulatory on admission 11/06/12 11/06/2012  . Systemic inflammatory response syndrome (SIRS) 11/06/2012  . Nausea and vomiting 08/09/2012  . Adrenal insufficiency 08/09/2012  . PAF (paroxysmal atrial fibrillation) 08/09/2012  . Hypotension 08/09/2012  . Dehydration 08/09/2012    Zaley Talley, PT 10/04/2014, 12:18 PM  [facility] [address] [place], [place], [address] Phone: [phone]   Fax:  [phone]

## 2014-10-07 ENCOUNTER — Ambulatory Visit: Payer: Medicare Other | Admitting: Rehabilitative and Restorative Service Providers"

## 2014-10-07 DIAGNOSIS — R269 Unspecified abnormalities of gait and mobility: Secondary | ICD-10-CM | POA: Diagnosis not present

## 2014-10-07 DIAGNOSIS — R531 Weakness: Secondary | ICD-10-CM

## 2014-10-07 NOTE — Therapy (Signed)
[facility] [address] [place], [place], [address] Phone: [phone]   Fax:  [phone]  Physical Therapy Treatment  Patient Details  Name: Andrea Yu MRN: [id number] Date of Birth: [birth year] Referring Provider:  Leanna Battles, MD  Encounter Date: 10/07/2014      PT End of Session - 10/07/14 1415    Visit Number 5   Number of Visits 16   Date for PT Re-Evaluation 11/23/14   Authorization Type G code required every 10th visit   PT Start Time 1235   PT Stop Time 1315   PT Time Calculation (min) 40 min   Equipment Utilized During Treatment Gait belt   Activity Tolerance Patient tolerated treatment well   Behavior During Therapy [facility] for tasks assessed/performed      Past Medical History  Diagnosis Date  . Hypertension   . Peripheral neuropathy     "from back OR in 2005; in hands and feet" (11/07/2012)  . Anemia   . History of blood transfusion     "after 1 of my knee ORs and today" (11/07/2012)  . A-fib     "just after knee 2nd OR" (11/07/2012)  . Psoriatic arthritis     on Prednisone Rx    Past Surgical History  Procedure Laterality Date  . Back surgery    . Replacement total knee bilateral Bilateral 2007  . Cholecystectomy  2000's  . Joint replacement    . Vaginal hysterectomy  1983  . Tubal ligation  1974  . Cataract extraction w/ intraocular lens  implant, bilateral Bilateral 2007  . Posterior lumbar fusion  2005    "got a rod and 4 pins in there" (11/07/2012)  . Colonoscopy N/A 11/10/2012    Procedure: COLONOSCOPY;  Surgeon: Beryle Beams, MD;  Location: [facility];  Service: Endoscopy;  Laterality: N/A;  . Esophagogastroduodenoscopy N/A 11/10/2012    Procedure: ESOPHAGOGASTRODUODENOSCOPY (EGD);  Surgeon: Beryle Beams, MD;  Location: [facility] END[facility]COPY;  Service: Endoscopy;  Laterality: N/A;  . Transthoracic echocardiogram  05/24/2006    There were no vitals filed for this  visit.  Visit Diagnosis:  Abnormality of gait  Generalized weakness      Subjective Assessment - 10/07/14 1241    Subjective The patient feels fatigued and thinks her iron is low.    Patient Stated Goals "To walk again".    Currently in Pain? Yes   Pain Score --  sore from arthritis   Pain Location Shoulder   Pain Orientation Right;Left   Pain Descriptors / Indicators Aching   Pain Type Chronic pain   Pain Onset In the past 7 days   Pain Frequency Intermittent      Gait: Ambulation x 60 feet x 2 reps, 40 feet x 1 rep with RW and CGA for safety.  Patient uses L hip IR to lock knee out and prevent knee buckling.  PT provides cues for foot placement. Sit<>stand with supervision x 3 reps  THERAPEUTIC EXERCISE: Supine bridges x 10 adding ball squeeze Hip adductor stretch supine R and L sides sidelying hip abduction x 10 reps R and L sides Supine UE AA/ROM into flexion, abduction, and ER with wand for overpressure Supine SLRs        PT Short Term Goals - 10/02/14 1337    PT SHORT TERM GOAL #1   Title The patient will be independent with HEP for habituation, general mobility, and balance.   Baseline Target date 10/24/2014   Time  4   Period Weeks   Status On-going   PT SHORT TERM GOAL #2   Title The patient will have negative R sidelying test indicating resolution of R BPPV.   Baseline Met on 09/24/2014 with patient feeling vertigo resolved.   Time 4   Period Weeks   Status Achieved   PT SHORT TERM GOAL #3   Title The patient will move sit<>stand modified indep to RW.   Baseline Met on 10/02/2014   Time 4   Period Weeks   Status Achieved   PT SHORT TERM GOAL #4   Title The patient will ambulate with supervision with RW x 100 ft in order to return to household ambulation.   Baseline Target date 10/24/2014   Time 4   Period Weeks   Status On-going   PT SHORT TERM GOAL #5   Title The patient will be further assessed on Berg, gait speed once vertigo treated and LTGs to  follow.   Baseline Met on 09/24/2014 with [name] 11/56 and gait speed=0.97 ft/sec    Time 4   Period Weeks   Status Achieved           PT Long Term Goals - 09/23/14 2137    PT LONG TERM GOAL #1   Title The patient will be independent with progression of HEP for post d/c activity.   Baseline Target date 11/23/2014   Time 8   Period Weeks   PT LONG TERM GOAL #2   Title The patient will improve Berg score by 6 points to demonstrate improved steady state balance for ADLs.   Baseline Target date 11/23/2014   Time 8   Period Weeks   PT LONG TERM GOAL #3   Title The patient will improve gait speed by 0.5 ft/sec to demonstrate improved functional ambulation for household surfaces.   Baseline Target date 11/23/2014   Time 8   Period Weeks   PT LONG TERM GOAL #4   Title The patient will ambulate x 200 feet nonstop with RW modified indep.   Baseline Target date 11/23/2014   Time 8   Period Weeks   PT LONG TERM GOAL #5   Title The patient will verbalize understanding of fall prevention strategies for home safety.   Baseline Target date 11/23/2014   Time 8   Period Weeks               Plan - 10/07/14 1415    Clinical Impression Statement The patient feels fatigue hindering her home mobility and participation with HEP.  Patient to further discuss with MD.   PT Next Visit Plan LE strength, gait, general balance/mobility.   Consulted and Agree with Plan of Care Patient        Problem List Patient Active Problem List   Diagnosis Date Noted  . SAH (subarachnoid hemorrhage) 09/14/2014  . A-fib 09/14/2014  . Hypertension 09/14/2014  . Laceration of eyebrow, left 09/14/2014  . Fall 09/14/2014  . Gastritis, acute 11/11/2012  . Cellulitis of leg, right 11/11/2012  . Obesity (BMI 30-39.9)- suspected sleep apnea, declines sleep study 11/10/2012  . Psoriatic arthritis 11/06/2012  . Iron deficiency anemia- transfused this admission 11/06/2012  . Depression 11/06/2012  .  Peripheral neuropathy 11/06/2012  . Shock circulatory on admission 11/06/12 11/06/2012  . Systemic inflammatory response syndrome (SIRS) 11/06/2012  . Nausea and vomiting 08/09/2012  . Adrenal insufficiency 08/09/2012  . PAF (paroxysmal atrial fibrillation) 08/09/2012  . Hypotension 08/09/2012  . Dehydration 08/09/2012  [place] 10/07/2014, 2:16 PM  [facility] [address] [place], [place], [address] Phone: [phone]   Fax:  [phone]

## 2014-10-11 ENCOUNTER — Ambulatory Visit: Payer: Medicare Other | Admitting: Rehabilitative and Restorative Service Providers"

## 2014-10-11 DIAGNOSIS — R269 Unspecified abnormalities of gait and mobility: Secondary | ICD-10-CM | POA: Diagnosis not present

## 2014-10-11 DIAGNOSIS — R531 Weakness: Secondary | ICD-10-CM

## 2014-10-11 NOTE — Patient Instructions (Signed)
Rolling   With pillow under head, start on back. Roll slowly to right. Hold position until any dizziness stops, plus 20 seconds. Roll slowly onto left side. Hold position until dizziness stops, plus 20 seconds. Repeat sequence __5__ times per session. Do ___2_ sessions per day.  Copyright  VHI. All rights reserved.

## 2014-10-11 NOTE — Therapy (Signed)
[facility] [address] [place], [place], [address] Phone: [phone]   Fax:  [phone]  Physical Therapy Treatment  Patient Details  Name: Andrea Yu MRN: [id number] Date of Birth: 1940-08-15 Referring Provider:  Leanna Battles, MD  Encounter Date: 10/11/2014      PT End of Session - 10/11/14 1556    Visit Number 6   Number of Visits 16   Date for PT Re-Evaluation 11/23/14   Authorization Type G code required every 10th visit   PT Start Time 1355   PT Stop Time 1435   PT Time Calculation (min) 40 min   Equipment Utilized During Treatment Gait belt   Activity Tolerance Patient tolerated treatment well   Behavior During Therapy [facility] for tasks assessed/performed      Past Medical History  Diagnosis Date  . Hypertension   . Peripheral neuropathy     "from back OR in 2005; in hands and feet" (11/07/2012)  . Anemia   . History of blood transfusion     "after 1 of my knee ORs and today" (11/07/2012)  . A-fib     "just after knee 2nd OR" (11/07/2012)  . Psoriatic arthritis     on Prednisone Rx    Past Surgical History  Procedure Laterality Date  . Back surgery    . Replacement total knee bilateral Bilateral 2007  . Cholecystectomy  2000's  . Joint replacement    . Vaginal hysterectomy  1983  . Tubal ligation  1974  . Cataract extraction w/ intraocular lens  implant, bilateral Bilateral 2007  . Posterior lumbar fusion  2005    "got a rod and 4 pins in there" (11/07/2012)  . Colonoscopy N/A 11/10/2012    Procedure: COLONOSCOPY;  Surgeon: Beryle Beams, MD;  Location: [facility];  Service: Endoscopy;  Laterality: N/A;  . Esophagogastroduodenoscopy N/A 11/10/2012    Procedure: ESOPHAGOGASTRODUODENOSCOPY (EGD);  Surgeon: Beryle Beams, MD;  Location: [facility] ENDOSCOPY;  Service: Endoscopy;  Laterality: N/A;  . Transthoracic echocardiogram  05/24/2006    There were no vitals filed for this  visit.  Visit Diagnosis:  Abnormality of gait  Generalized weakness      Subjective Assessment - 10/11/14 1359    Subjective The patient is walking at home with her husband holding onto gait belt for safety.   Pertinent History peripheral neuropathy, h/o back surgery 2005, a-fib.  Primary care MD:  Leanna Battles, MD   Patient Stated Goals "To walk again".    Currently in Pain? No/denies  "just fatigue and tiredness"             [facility] Adult PT Treatment/Exercise - 10/11/14 1422    Transfers   Transfers Sit to Stand   Sit to Stand 5: Supervision   Comments focusing on upright posture   Ambulation/Gait   Ambulation/Gait Yes   Ambulation/Gait Assistance 4: Min guard   Ambulation Distance (Feet) --  100 feet, 20 feet x 4 reps   Assistive device Rolling walker   Gait Pattern Decreased stride length;Right foot flat;Left foot flat   Exercises   Exercises Other Exercises  Long arc quads x 2 lbs x 10 reps R and L   Other Exercises  Sidestepping with UE support.  Standing mini squats x 10 reps, attempted heel raises          Vestibular Treatment/Exercise - 10/11/14 1413    Vestibular Treatment/Exercise   Vestibular Treatment Provided Habituation   Habituation Exercises Horizontal  Roll   Horizontal Roll   Number of Reps  3   Symptom Description  mild dizziness to the left side               PT Education - 10/11/14 1438    Education provided Yes   Education Details HEP: habituation rolling   Person(s) Educated Patient   Methods Explanation;Demonstration;Handout   Comprehension Verbalized understanding          PT Short Term Goals - 10/02/14 1337    PT SHORT TERM GOAL #1   Title The patient will be independent with HEP for habituation, general mobility, and balance.   Baseline Target date 10/24/2014   Time 4   Period Weeks   Status On-going   PT SHORT TERM GOAL #2   Title The patient will have negative R sidelying test indicating resolution of R BPPV.    Baseline Met on 09/24/2014 with patient feeling vertigo resolved.   Time 4   Period Weeks   Status Achieved   PT SHORT TERM GOAL #3   Title The patient will move sit<>stand modified indep to RW.   Baseline Met on 10/02/2014   Time 4   Period Weeks   Status Achieved   PT SHORT TERM GOAL #4   Title The patient will ambulate with supervision with RW x 100 ft in order to return to household ambulation.   Baseline Target date 10/24/2014   Time 4   Period Weeks   Status On-going   PT SHORT TERM GOAL #5   Title The patient will be further assessed on Berg, gait speed once vertigo treated and LTGs to follow.   Baseline Met on 09/24/2014 with [name] 11/56 and gait speed=0.97 ft/sec    Time 4   Period Weeks   Status Achieved           PT Long Term Goals - 09/23/14 2137    PT LONG TERM GOAL #1   Title The patient will be independent with progression of HEP for post d/c activity.   Baseline Target date 11/23/2014   Time 8   Period Weeks   PT LONG TERM GOAL #2   Title The patient will improve Berg score by 6 points to demonstrate improved steady state balance for ADLs.   Baseline Target date 11/23/2014   Time 8   Period Weeks   PT LONG TERM GOAL #3   Title The patient will improve gait speed by 0.5 ft/sec to demonstrate improved functional ambulation for household surfaces.   Baseline Target date 11/23/2014   Time 8   Period Weeks   PT LONG TERM GOAL #4   Title The patient will ambulate x 200 feet nonstop with RW modified indep.   Baseline Target date 11/23/2014   Time 8   Period Weeks   PT LONG TERM GOAL #5   Title The patient will verbalize understanding of fall prevention strategies for home safety.   Baseline Target date 11/23/2014   Time 8   Period Weeks               Plan - 10/11/14 1557    Clinical Impression Statement The patient is ambulating greater distances at home with her husband's assistance with RW.  She is progressing standing tolerance in PT.   continue to STGs/LTGs.   PT Next Visit Plan LE strength, gait, general balance/mobility.  Begin checking STGs.   Consulted and Agree with Plan of Care Patient  Problem List Patient Active Problem List   Diagnosis Date Noted  . SAH (subarachnoid hemorrhage) 09/14/2014  . A-fib 09/14/2014  . Hypertension 09/14/2014  . Laceration of eyebrow, left 09/14/2014  . Fall 09/14/2014  . Gastritis, acute 11/11/2012  . Cellulitis of leg, right 11/11/2012  . Obesity (BMI 30-39.9)- suspected sleep apnea, declines sleep study 11/10/2012  . Psoriatic arthritis 11/06/2012  . Iron deficiency anemia- transfused this admission 11/06/2012  . Depression 11/06/2012  . Peripheral neuropathy 11/06/2012  . Shock circulatory on admission 11/06/12 11/06/2012  . Systemic inflammatory response syndrome (SIRS) 11/06/2012  . Nausea and vomiting 08/09/2012  . Adrenal insufficiency 08/09/2012  . PAF (paroxysmal atrial fibrillation) 08/09/2012  . Hypotension 08/09/2012  . Dehydration 08/09/2012    [place], PT 10/11/2014, 3:58 PM  [facility] [address] [place], [place], [address] Phone: [phone]   Fax:  [phone]

## 2014-10-14 ENCOUNTER — Ambulatory Visit: Payer: Medicare Other | Admitting: Rehabilitative and Restorative Service Providers"

## 2014-10-14 DIAGNOSIS — R269 Unspecified abnormalities of gait and mobility: Secondary | ICD-10-CM | POA: Diagnosis not present

## 2014-10-14 DIAGNOSIS — R531 Weakness: Secondary | ICD-10-CM

## 2014-10-14 NOTE — Therapy (Signed)
[facility] [address] [place], [place], [address] Phone: [phone]   Fax:  [phone]  Physical Therapy Treatment  Patient Details  Name: Andrea Yu MRN: [id number] Date of Birth: [birth year] Referring Provider:  Leanna Battles, MD  Encounter Date: 10/14/2014      PT End of Session - 10/14/14 1453    Visit Number 7   Number of Visits 16   Date for PT Re-Evaluation 11/23/14   Authorization Type G code required every 10th visit   PT Start Time 1240   PT Stop Time 1320   PT Time Calculation (min) 40 min   Equipment Utilized During Treatment Gait belt   Activity Tolerance Patient tolerated treatment well   Behavior During Therapy [facility] for tasks assessed/performed      Past Medical History  Diagnosis Date  . Hypertension   . Peripheral neuropathy     "from back OR in 2005; in hands and feet" (11/07/2012)  . Anemia   . History of blood transfusion     "after 1 of my knee ORs and today" (11/07/2012)  . A-fib     "just after knee 2nd OR" (11/07/2012)  . Psoriatic arthritis     on Prednisone Rx    Past Surgical History  Procedure Laterality Date  . Back surgery    . Replacement total knee bilateral Bilateral 2007  . Cholecystectomy  2000's  . Joint replacement    . Vaginal hysterectomy  1983  . Tubal ligation  1974  . Cataract extraction w/ intraocular lens  implant, bilateral Bilateral 2007  . Posterior lumbar fusion  2005    "got a rod and 4 pins in there" (11/07/2012)  . Colonoscopy N/A 11/10/2012    Procedure: COLONOSCOPY;  Surgeon: Beryle Beams, MD;  Location: [facility];  Service: Endoscopy;  Laterality: N/A;  . Esophagogastroduodenoscopy N/A 11/10/2012    Procedure: ESOPHAGOGASTRODUODENOSCOPY (EGD);  Surgeon: Beryle Beams, MD;  Location: [facility] ENDOSCOPY;  Service: Endoscopy;  Laterality: N/A;  . Transthoracic echocardiogram  05/24/2006    There were no vitals filed for this  visit.  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  She is fatigued today from spending hours at church yesterday.    Pertinent History peripheral neuropathy, h/o back surgery 2005, a-fib.  Primary care MD:  Leanna Battles, MD   Patient Stated Goals "To walk again".    Currently in Pain? Yes   Pain Score 0-No pain  sore in shoulders from playing piano yesterday             [facility] Adult PT Treatment/Exercise - 10/14/14 1455    Transfers   Transfers Sit to Stand   Sit to Stand 6: Modified independent (Device/Increase time)   Comments x 3 times   Ambulation/Gait   Ambulation/Gait Yes   Ambulation/Gait Assistance 5: Supervision   Ambulation Distance (Feet) 75 Feet  rest, ambulate x 45 feet   Assistive device Rolling walker   Gait Pattern Decreased stride length;Right foot flat;Left foot flat   Neuro Re-ed    Neuro Re-ed Details  standing balance decreasing UE suport emphasizing foot placement, tried partial heel/toe, wider base of support and provided verbal cues for foot contact through floor.  Moving R and L LE into/out of stride positions with CGA  in parallel bars.   Exercises   Exercises Other Exercises   Other Exercises  Sidestepping with UE support, standing shoulder circles and scapular retraction           PT Short Term Goals - 10/02/14 1337    PT SHORT TERM GOAL #1   Title The patient will be independent with HEP for habituation, general mobility, and balance.   Baseline Target date 10/24/2014   Time 4   Period Weeks   Status On-going   PT SHORT TERM GOAL #2   Title The patient will have negative R sidelying test indicating resolution of R BPPV.   Baseline Met on 09/24/2014 with patient feeling vertigo resolved.   Time 4   Period Weeks   Status Achieved   PT SHORT TERM  GOAL #3   Title The patient will move sit<>stand modified indep to RW.   Baseline Met on 10/02/2014   Time 4   Period Weeks   Status Achieved   PT SHORT TERM GOAL #4   Title The patient will ambulate with supervision with RW x 100 ft in order to return to household ambulation.   Baseline Target date 10/24/2014   Time 4   Period Weeks   Status On-going   PT SHORT TERM GOAL #5   Title The patient will be further assessed on Berg, gait speed once vertigo treated and LTGs to follow.   Baseline Met on 09/24/2014 with [name] 11/56 and gait speed=0.97 ft/sec    Time 4   Period Weeks   Status Achieved           PT Long Term Goals - 09/23/14 2137    PT LONG TERM GOAL #1   Title The patient will be independent with progression of HEP for post d/c activity.   Baseline Target date 11/23/2014   Time 8   Period Weeks   PT LONG TERM GOAL #2   Title The patient will improve Berg score by 6 points to demonstrate improved steady state balance for ADLs.   Baseline Target date 11/23/2014   Time 8   Period Weeks   PT LONG TERM GOAL #3   Title The patient will improve gait speed by 0.5 ft/sec to demonstrate improved functional ambulation for household surfaces.   Baseline Target date 11/23/2014   Time 8   Period Weeks   PT LONG TERM GOAL #4   Title The patient will ambulate x 200 feet nonstop with RW modified indep.   Baseline Target date 11/23/2014   Time 8   Period Weeks   PT LONG TERM GOAL #5   Title The patient will verbalize understanding of fall prevention strategies for home safety.   Baseline Target date 11/23/2014   Time 8   Period Weeks               Plan - 10/14/14 1454    Clinical Impression Statement The patient's UE weakness/pain limit extended ambulation distance.  OT to evaluate patient next week.  Continue towards STGs/LTGs.   PT Next Visit Plan LE strength, gait, general balance/mobility.  Begin checking STGs.   Consulted and Agree with Plan of Care Patient         Problem List Patient Active Problem List   Diagnosis Date Noted  . SAH (subarachnoid hemorrhage) 09/14/2014  . A-fib 09/14/2014  . Hypertension 09/14/2014  . Laceration of eyebrow, left 09/14/2014  . Fall 09/14/2014  . Gastritis, acute 11/11/2012  . Cellulitis of leg, right  11/11/2012  . Obesity (BMI 30-39.9)- suspected sleep apnea, declines sleep study 11/10/2012  . Psoriatic arthritis 11/06/2012  . Iron deficiency anemia- transfused this admission 11/06/2012  . Depression 11/06/2012  . Peripheral neuropathy 11/06/2012  . Shock circulatory on admission 11/06/12 11/06/2012  . Systemic inflammatory response syndrome (SIRS) 11/06/2012  . Nausea and vomiting 08/09/2012  . Adrenal insufficiency 08/09/2012  . PAF (paroxysmal atrial fibrillation) 08/09/2012  . Hypotension 08/09/2012  . Dehydration 08/09/2012    [place], PT 10/14/2014, 2:58 PM  [facility] [address] [place], [place], [address] Phone: [phone]   Fax:  [phone]

## 2014-10-18 ENCOUNTER — Ambulatory Visit: Payer: Medicare Other | Admitting: Rehabilitative and Restorative Service Providers"

## 2014-10-18 DIAGNOSIS — R531 Weakness: Secondary | ICD-10-CM

## 2014-10-18 DIAGNOSIS — R269 Unspecified abnormalities of gait and mobility: Secondary | ICD-10-CM | POA: Diagnosis not present

## 2014-10-18 NOTE — Therapy (Signed)
[facility] [address] [place], [place], [address] Phone: [phone]   Fax:  [phone]  Physical Therapy Treatment  Patient Details  Name: Andrea Yu MRN: [id number] Date of Birth: [birth year] Referring Provider:  Leanna Battles, MD  Encounter Date: 10/18/2014      PT End of Session - 10/18/14 1418    Visit Number 8   Number of Visits 16   Date for PT Re-Evaluation 11/23/14   Authorization Type G code required every 10th visit   PT Start Time 1408   PT Stop Time 1448   PT Time Calculation (min) 40 min   Equipment Utilized During Treatment Gait belt   Activity Tolerance Patient tolerated treatment well   Behavior During Therapy [facility] for tasks assessed/performed      Past Medical History  Diagnosis Date  . Hypertension   . Peripheral neuropathy     "from back OR in 2005; in hands and feet" (11/07/2012)  . Anemia   . History of blood transfusion     "after 1 of my knee ORs and today" (11/07/2012)  . A-fib     "just after knee 2nd OR" (11/07/2012)  . Psoriatic arthritis     on Prednisone Rx    Past Surgical History  Procedure Laterality Date  . Back surgery    . Replacement total knee bilateral Bilateral 2007  . Cholecystectomy  2000's  . Joint replacement    . Vaginal hysterectomy  1983  . Tubal ligation  1974  . Cataract extraction w/ intraocular lens  implant, bilateral Bilateral 2007  . Posterior lumbar fusion  2005    "got a rod and 4 pins in there" (11/07/2012)  . Colonoscopy N/A 11/10/2012    Procedure: COLONOSCOPY;  Surgeon: Beryle Beams, MD;  Location: [facility];  Service: Endoscopy;  Laterality: N/A;  . Esophagogastroduodenoscopy N/A 11/10/2012    Procedure: ESOPHAGOGASTRODUODENOSCOPY (EGD);  Surgeon: Beryle Beams, MD;  Location: [facility] ENDOSCOPY;  Service: Endoscopy;  Laterality: N/A;  . Transthoracic echocardiogram  05/24/2006    There were no vitals filed for this  visit.  Visit Diagnosis:  Abnormality of gait  Generalized weakness      Subjective Assessment - 10/18/14 1410    Subjective The patient reports this morning, her left leg would not lift easily.  She is doing HEP regularly.  Her right arm is sore from cleaning activities at the house.   Patient Stated Goals "To walk again".    Currently in Pain? Yes   Pain Score 2    Pain Location Shoulder   Pain Orientation Right;Left   Pain Descriptors / Indicators Aching   Pain Type Chronic pain   Pain Onset More than a month ago   Pain Frequency Intermittent   Aggravating Factors  cleaning at home aggravated   Pain Relieving Factors time            [facility] Adult PT Treatment/Exercise - 10/18/14 1419    Transfers   Transfers Sit to Stand   Sit to Stand 6: Modified independent (Device/Increase time)   Comments x 3 reps   Ambulation/Gait   Ambulation/Gait Yes   Ambulation/Gait Assistance 5: Supervision   Ambulation Distance (Feet) 115 Feet  and then 28 feet   Assistive device Rolling walker   Gait Pattern Decreased stride length;Right foot flat;Left foot flat   Gait Comments Patient turns L foot/hip into IR when fatigued to lock out and decrease L knee buckling   Knee/Hip  Exercises: Supine   Short Arc Quad Sets Both;10 reps  adding 2 pound weights   Bridges Both;Strengthening;10 reps   Straight Leg Raises AROM;10 reps;Both  with cues on quad set first   Other Supine Knee/Hip Exercises supine marches with core stabilization x 10 reps                  PT Short Term Goals - 10/02/14 1337    PT SHORT TERM GOAL #1   Title The patient will be independent with HEP for habituation, general mobility, and balance.   Baseline Target date 10/24/2014   Time 4   Period Weeks   Status On-going   PT SHORT TERM GOAL #2   Title The patient will have negative R sidelying test indicating resolution of R BPPV.   Baseline Met on 09/24/2014 with patient feeling vertigo resolved.   Time 4    Period Weeks   Status Achieved   PT SHORT TERM GOAL #3   Title The patient will move sit<>stand modified indep to RW.   Baseline Met on 10/02/2014   Time 4   Period Weeks   Status Achieved   PT SHORT TERM GOAL #4   Title The patient will ambulate with supervision with RW x 100 ft in order to return to household ambulation.   Baseline Target date 10/24/2014   Time 4   Period Weeks   Status On-going   PT SHORT TERM GOAL #5   Title The patient will be further assessed on Berg, gait speed once vertigo treated and LTGs to follow.   Baseline Met on 09/24/2014 with [name] 11/56 and gait speed=0.97 ft/sec    Time 4   Period Weeks   Status Achieved           PT Long Term Goals - 09/23/14 2137    PT LONG TERM GOAL #1   Title The patient will be independent with progression of HEP for post d/c activity.   Baseline Target date 11/23/2014   Time 8   Period Weeks   PT LONG TERM GOAL #2   Title The patient will improve Berg score by 6 points to demonstrate improved steady state balance for ADLs.   Baseline Target date 11/23/2014   Time 8   Period Weeks   PT LONG TERM GOAL #3   Title The patient will improve gait speed by 0.5 ft/sec to demonstrate improved functional ambulation for household surfaces.   Baseline Target date 11/23/2014   Time 8   Period Weeks   PT LONG TERM GOAL #4   Title The patient will ambulate x 200 feet nonstop with RW modified indep.   Baseline Target date 11/23/2014   Time 8   Period Weeks   PT LONG TERM GOAL #5   Title The patient will verbalize understanding of fall prevention strategies for home safety.   Baseline Target date 11/23/2014   Time 8   Period Weeks               Plan - 10/18/14 2254    Clinical Impression Statement The patient is progressing in ambulation distance- she is still requiring close supervision to [place] for safety.  Continue working towards [employer], [place].   PT Next Visit Plan LE strength, gait, general balance/mobility.  Begin  checking STGs.   Consulted and Agree with Plan of Care Patient        Problem List Patient Active Problem List   Diagnosis Date Noted  . SAH (  subarachnoid hemorrhage) 09/14/2014  . A-fib 09/14/2014  . Hypertension 09/14/2014  . Laceration of eyebrow, left 09/14/2014  . Fall 09/14/2014  . Gastritis, acute 11/11/2012  . Cellulitis of leg, right 11/11/2012  . Obesity (BMI 30-39.9)- suspected sleep apnea, declines sleep study 11/10/2012  . Psoriatic arthritis 11/06/2012  . Iron deficiency anemia- transfused this admission 11/06/2012  . Depression 11/06/2012  . Peripheral neuropathy 11/06/2012  . Shock circulatory on admission 11/06/12 11/06/2012  . Systemic inflammatory response syndrome (SIRS) 11/06/2012  . Nausea and vomiting 08/09/2012  . Adrenal insufficiency 08/09/2012  . PAF (paroxysmal atrial fibrillation) 08/09/2012  . Hypotension 08/09/2012  . Dehydration 08/09/2012    [place], PT 10/18/2014, 10:55 PM  [facility] [address] [place], [place], [address] Phone: [phone]   Fax:  [phone]

## 2014-10-21 ENCOUNTER — Encounter: Payer: Self-pay | Admitting: Occupational Therapy

## 2014-10-21 ENCOUNTER — Ambulatory Visit: Payer: Medicare Other | Admitting: Rehabilitative and Restorative Service Providers"

## 2014-10-21 ENCOUNTER — Ambulatory Visit: Payer: Medicare Other | Admitting: Occupational Therapy

## 2014-10-21 DIAGNOSIS — M25619 Stiffness of unspecified shoulder, not elsewhere classified: Secondary | ICD-10-CM

## 2014-10-21 DIAGNOSIS — Z7409 Other reduced mobility: Secondary | ICD-10-CM

## 2014-10-21 DIAGNOSIS — R269 Unspecified abnormalities of gait and mobility: Secondary | ICD-10-CM

## 2014-10-21 DIAGNOSIS — M25519 Pain in unspecified shoulder: Secondary | ICD-10-CM

## 2014-10-21 DIAGNOSIS — R29898 Other symptoms and signs involving the musculoskeletal system: Secondary | ICD-10-CM

## 2014-10-21 DIAGNOSIS — R531 Weakness: Secondary | ICD-10-CM

## 2014-10-21 DIAGNOSIS — R201 Hypoesthesia of skin: Secondary | ICD-10-CM

## 2014-10-21 NOTE — Therapy (Signed)
[facility] [address] [place], [place], [address] Phone: [phone]   Fax:  [phone]  Occupational Therapy Evaluation  Patient Details  Name: Andrea Yu MRN: [id number] Date of Birth: March 11, 1940 Referring Provider:  Jarome Matin, MD  Encounter Date: 10/21/2014      OT End of Session - 10/21/14 1333    Visit Number 1   Number of Visits 9   Date for OT Re-Evaluation 11/20/14   Authorization Type [employer] MCR - G code needed   Authorization - Visit Number 1   Authorization - Number of Visits 10   OT Start Time 1150   OT Stop Time 1235   OT Time Calculation (min) 45 min   Activity Tolerance Patient tolerated treatment well      Past Medical History  Diagnosis Date  . Hypertension   . Peripheral neuropathy     "from back OR in 2005; in hands and feet" (11/07/2012)  . Anemia   . History of blood transfusion     "after 1 of my knee ORs and today" (11/07/2012)  . A-fib     "just after knee 2nd OR" (11/07/2012)  . Psoriatic arthritis     on Prednisone Rx    Past Surgical History  Procedure Laterality Date  . Back surgery    . Replacement total knee bilateral Bilateral 2007  . Cholecystectomy  2000's  . Joint replacement    . Vaginal hysterectomy  1983  . Tubal ligation  1974  . Cataract extraction w/ intraocular lens  implant, bilateral Bilateral 2007  . Posterior lumbar fusion  2005    "got a rod and 4 pins in there" (11/07/2012)  . Colonoscopy N/A 11/10/2012    Procedure: COLONOSCOPY;  Surgeon: Theda Belfast, MD;  Location: [facility] ENDOSCOPY;  Service: Endoscopy;  Laterality: N/A;  . Esophagogastroduodenoscopy N/A 11/10/2012    Procedure: ESOPHAGOGASTRODUODENOSCOPY (EGD);  Surgeon: Theda Belfast, MD;  Location: [facility] ENDOSCOPY;  Service: Endoscopy;  Laterality: N/A;  . Transthoracic echocardiogram  05/24/2006    There were no vitals filed for this visit.  Visit Diagnosis:  Stiffness of shoulder  joint, unspecified laterality - Plan: Ot plan of care cert/re-cert  Weakness of hand - Plan: Ot plan of care cert/re-cert  Pain of shoulder with external rotation, unspecified laterality - Plan: Ot plan of care cert/re-cert  Decreased functional mobility and endurance - Plan: Ot plan of care cert/re-cert  Impaired sensation - Plan: Ot plan of care cert/re-cert      Subjective Assessment - 10/21/14 1153    Pertinent History bilateral shoulder arthritis (chronic), peripheral neuropathy   Patient Stated Goals to prepare meal w/o assist   Currently in Pain? Yes  O.T. will not be addressing shoulder pain directly secondary to chronic in nature and being medically managed through medications, but will provide HEP to maintain sh. 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 EVAL   Home  Environment   Bathroom Shower/Tub Tub/Shower unit;Curtain   Home Equipment Grab bars - tub/shower;Shower seat;Bedside commode;Walker - 2 wheels  scooter, w/c   Additional Comments Pt lives in 1 story home with ramp to enter   Lives With Spouse   Prior Function   Level of Independence Requires assistive device for independence;Needs assistance with ADLs   Vocation Retired   ADL   ADL comments independent with eating, mod I for grooming (difficulty with brushing and washing hair d/t limited sh. ROM). Min assist dressing (assist to hook bra, and  occasionally pull up pants in the back). Modified independence bathing, but min to mod assist for transfer in/out shower/tub. Pt reports doing 50% of meal prep seated, then husband does the cooking over the stove. Husband performs day-to-day cleaning, and has someone come every 2 weeks for heavier cleaning.    Mobility   Mobility Status --  scooter in community, walker in home mobility   Written Expression   Dominant Hand Left   Handwriting --  decreased legibility d/t arthritis   Sensation   Light Touch Impaired by gross assessment  can detect light touch and localization, but reports "funny"   Additional Comments numbness/tingling bilateral hands and fingertips d/t peripheral neuropathy   Coordination   9 Hole Peg Test Right;Left   Right 9 Hole Peg Test 26.60 sec.    Left 9 Hole Peg Test 24.03 sec   ROM / Strength   AROM / PROM / Strength AROM;Strength   AROM   Overall AROM Comments BUE AROM: sh. flexion approx 50-60* with shoulder hiking, sh. abd. approx 45*. Rt sh. ER 50-75%, Lt sh. less than 50%. Bilateral sh. IR approx. 50%. Bilateral elbow flex/ext WFL's, Rt supination 90%, Lt supination 75%. BUE gross finger flexion/extension WFL's, but pt reports very stiff first thing in the am. with less movement   Hand Function   Right Hand Grip (lbs) 33 lbs   Left Hand Grip (lbs) 24 lbs                              OT Long Term Goals - 10/21/14 1342    OT LONG TERM GOAL #1   Title Independent w/ HEP (for bilateral shoulders and bilateral hand strength)   Time 4   Period Weeks   Status New   OT LONG TERM GOAL #2   Title Pt to simulate tub transfer with tub bench and verbalize understanding of use and acquisition of tub bench   Time 4   Period Weeks   Status New   OT LONG TERM GOAL #3   Title Pt to verbalize understanding of potential A/E needs for ADLS to increase ease and/or safety (including: brushing/washing hair, meal prep/cooking)   Time 4   Period Weeks    Status New   OT LONG TERM GOAL #4   Title Grip strength to increase bilaterally by 5 lbs    Baseline Rt = 33 lbs, Lt = 24 lbs   Time 4   Period Weeks   Status New               Plan - 10/21/14 1334    Clinical Impression Statement Pt is a 74 y.o. female who presents to outpatient rehab s/p SAH from fall.  Pt also with severely limitied bilateral shoulder ROM d/t chronic arthritis and decreased mobility d/t previous knee injuries.    Pt will benefit from skilled therapeutic intervention in order to improve on the following deficits (Retired) Decreased range of motion;Decreased coordination;Impaired flexibility;Improper body mechanics;Decreased endurance;Decreased safety awareness;Impaired sensation;Decreased activity tolerance;Decreased balance;Impaired UE functional use;Pain;Decreased mobility;Decreased strength   Rehab Potential Good   OT Frequency 2x / week   OT Duration 4 weeks  plus evaluation   OT Treatment/Interventions Self-care/ADL training;Therapeutic exercise;Functional Mobility Training;Patient/family education;Manual Therapy;Neuromuscular education;DME and/or AE instruction;Therapeutic activities;Moist Heat;Passive range of motion   Plan HEP for bilateral shoulders (cane ex supine, theraband rows, sh. ext) and putty HEP for bilateral hand strength   Consulted and Agree with Plan of Care Patient          G-Codes - 11-15-2014 1346    Functional Assessment Tool Used Grip strength Rt = 33 lbs, Lt = 24 lbs, Bilateral shoulder flexion between 50-60*   Functional Limitation Carrying, moving and handling objects   Carrying, Moving and Handling Objects Current Status (Y8657) At least 60 percent but less than 80 percent impaired, limited or restricted   Carrying, Moving and Handling Objects Goal Status (Q4696) At least 40 percent but less than 60 percent impaired, limited or restricted      Problem List Patient Active Problem List   Diagnosis Date Noted  . SAH  (subarachnoid hemorrhage) 09/14/2014  . A-fib 09/14/2014  . Hypertension 09/14/2014  . Laceration of eyebrow, left 09/14/2014  . Fall 09/14/2014  . Gastritis, acute 11/11/2012  . Cellulitis of leg, right 11/11/2012  . Obesity (BMI 30-39.9)- suspected sleep apnea, declines sleep study 11/10/2012  . Psoriatic arthritis 11/06/2012  . Iron deficiency anemia- transfused this admission 11/06/2012  . Depression 11/06/2012  . Peripheral neuropathy 11/06/2012  . Shock circulatory on admission 11/06/12 11/06/2012  . Systemic inflammatory response syndrome (SIRS) 11/06/2012  . Nausea and vomiting 08/09/2012  . Adrenal insufficiency 08/09/2012  . PAF (paroxysmal atrial fibrillation) 08/09/2012  . Hypotension 08/09/2012  . Dehydration 08/09/2012    Kelli Churn , OTR/L Nov 15, 2014, 1:51 PM  [facility] [address] [place], [place], [address] Phone: [phone]   Fax:  [phone]

## 2014-10-21 NOTE — Therapy (Signed)
[facility] [address] [place], [place], [address] Phone: [phone]   Fax:  [phone]  Physical Therapy Treatment  Patient Details  Name: Andrea Yu MRN: [id number] Date of Birth: 02/14/1940 Referring Provider:  Leanna Battles, MD  Encounter Date: 10/21/2014      PT End of Session - 10/21/14 1518    Visit Number 9   Number of Visits 16   Date for PT Re-Evaluation 11/23/14   Authorization Type G code required every 10th visit   PT Start Time 1235   PT Stop Time 1315   PT Time Calculation (min) 40 min   Equipment Utilized During Treatment Gait belt   Activity Tolerance Patient tolerated treatment well   Behavior During Therapy [facility] for tasks assessed/performed      Past Medical History  Diagnosis Date  . Hypertension   . Peripheral neuropathy     "from back OR in 2005; in hands and feet" (11/07/2012)  . Anemia   . History of blood transfusion     "after 1 of my knee ORs and today" (11/07/2012)  . A-fib     "just after knee 2nd OR" (11/07/2012)  . Psoriatic arthritis     on Prednisone Rx    Past Surgical History  Procedure Laterality Date  . Back surgery    . Replacement total knee bilateral Bilateral 2007  . Cholecystectomy  2000's  . Joint replacement    . Vaginal hysterectomy  1983  . Tubal ligation  1974  . Cataract extraction w/ intraocular lens  implant, bilateral Bilateral 2007  . Posterior lumbar fusion  2005    "got a rod and 4 pins in there" (11/07/2012)  . Colonoscopy N/A 11/10/2012    Procedure: COLONOSCOPY;  Surgeon: Beryle Beams, MD;  Location: [facility];  Service: Endoscopy;  Laterality: N/A;  . Esophagogastroduodenoscopy N/A 11/10/2012    Procedure: ESOPHAGOGASTRODUODENOSCOPY (EGD);  Surgeon: Beryle Beams, MD;  Location: [facility] ENDOSCOPY;  Service: Endoscopy;  Laterality: N/A;  . Transthoracic echocardiogram  05/24/2006    There were no vitals filed for this  visit.  Visit Diagnosis:  Abnormality of gait  Generalized weakness      Subjective Assessment - 10/21/14 1238    Subjective The patient reports that she was sore after PT on Friday.  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  She was evaluated by OT this morning.  The patient feels more confident with balance this week.   Patient Stated Goals "To walk again".    Currently in Pain? Yes  see OT notes for pain in UEs.            [facility] PT Assessment - 10/21/14 1244    Standardized Balance Assessment   Standardized Balance Assessment Berg Balance Test   Berg Balance Test   Sit to Stand Able to stand  independently using hands   Standing Unsupported Able to stand 2 minutes with supervision   Sitting with Back Unsupported but Feet Supported on Floor or Stool Able to sit safely and securely 2 minutes   Stand to Sit Sits safely with minimal use of hands   Transfers Able to transfer with verbal cueing and /or supervision   Standing Unsupported with Eyes Closed Able to stand 3 seconds   Standing Ubsupported with Feet Together Able to place feet together independently but unable to hold for 30 seconds   From Standing, Reach Forward with Outstretched Arm Reaches forward but  needs supervision   From Standing Position, Pick up Object from Floor Unable to try/needs assist to keep balance   From Standing Position, Turn to Look Behind Over each Shoulder Needs assist to keep from losing balance and falling   Turn 360 Degrees Needs assistance while turning   Standing Unsupported, Alternately Place Feet on Step/Stool Needs assistance to keep from falling or unable to try   Standing Unsupported, One Foot in [place] balance while stepping or standing   Standing on One Leg Unable to try or needs assist to prevent fall   Total Score 21   Berg comment: 21/56, improved from 11/56                      [facility] Adult PT Treatment/Exercise - 10/21/14 1300     Ambulation/Gait   Ambulation/Gait Yes   Ambulation/Gait Assistance 5: Supervision   Ambulation Distance (Feet) 100 Feet  20 x 2   Assistive device Rolling walker   Gait Pattern Decreased stride length;Right foot flat;Left foot flat   Gait Comments Patient turns L foot/hip into IR when fatigued to lock out and decrease L knee buckling   High Level Balance   High Level Balance Activities Other (comment)   High Level Balance Comments standing balance with mirror for cues to allow patient to see where her foot placement, weight shift, etc are through LEs.                  PT Short Term Goals - 10/21/14 1241    PT SHORT TERM GOAL #1   Title The patient will be independent with HEP for habituation, general mobility, and balance.   Baseline Met per report on 10/21/2014   Time 4   Period Weeks   Status Achieved   PT SHORT TERM GOAL #2   Title The patient will have negative R sidelying test indicating resolution of R BPPV.   Baseline Met on 09/24/2014 with patient feeling vertigo resolved.   Time 4   Period Weeks   Status Achieved   PT SHORT TERM GOAL #3   Title The patient will move sit<>stand modified indep to RW.   Baseline Met on 10/02/2014   Time 4   Period Weeks   Status Achieved   PT SHORT TERM GOAL #4   Title The patient will ambulate with supervision with RW x 100 ft in order to return to household ambulation.   Baseline Target date 10/24/2014   Time 4   Period Weeks   Status Achieved   PT SHORT TERM GOAL #5   Title The patient will be further assessed on Berg, gait speed once vertigo treated and LTGs to follow.   Baseline Met on 09/24/2014 with [name] 11/56 and gait speed=0.97 ft/sec    Time 4   Period Weeks   Status Achieved           PT Long Term Goals - 09/23/14 2137    PT LONG TERM GOAL #1   Title The patient will be independent with progression of HEP for post d/c activity.   Baseline Target date 11/23/2014   Time 8   Period Weeks   PT LONG TERM GOAL  #2   Title The patient will improve Berg score by 6 points to demonstrate improved steady state balance for ADLs.   Baseline Target date 11/23/2014   Time 8   Period Weeks   PT LONG TERM GOAL #3   Title The patient  will improve gait speed by 0.5 ft/sec to demonstrate improved functional ambulation for household surfaces.   Baseline Target date 11/23/2014   Time 8   Period Weeks   PT LONG TERM GOAL #4   Title The patient will ambulate x 200 feet nonstop with RW modified indep.   Baseline Target date 11/23/2014   Time 8   Period Weeks   PT LONG TERM GOAL #5   Title The patient will verbalize understanding of fall prevention strategies for home safety.   Baseline Target date 11/23/2014   Time 8   Period Weeks               Plan - 10/21/14 1519    Clinical Impression Statement The patient met STGs.  Her L knee buckling + lack of sensation in bilateral LEs limit balance activities without UE support.   PT Next Visit Plan LE strength, gait, general balance/mobility.  Work towards [employer].   Consulted and Agree with Plan of Care Patient        Problem List Patient Active Problem List   Diagnosis Date Noted  . SAH (subarachnoid hemorrhage) 09/14/2014  . A-fib 09/14/2014  . Hypertension 09/14/2014  . Laceration of eyebrow, left 09/14/2014  . Fall 09/14/2014  . Gastritis, acute 11/11/2012  . Cellulitis of leg, right 11/11/2012  . Obesity (BMI 30-39.9)- suspected sleep apnea, declines sleep study 11/10/2012  . Psoriatic arthritis 11/06/2012  . Iron deficiency anemia- transfused this admission 11/06/2012  . Depression 11/06/2012  . Peripheral neuropathy 11/06/2012  . Shock circulatory on admission 11/06/12 11/06/2012  . Systemic inflammatory response syndrome (SIRS) 11/06/2012  . Nausea and vomiting 08/09/2012  . Adrenal insufficiency 08/09/2012  . PAF (paroxysmal atrial fibrillation) 08/09/2012  . Hypotension 08/09/2012  . Dehydration 08/09/2012    Mckenzi Buonomo,  PT 10/21/2014, 3:20 PM  [facility] [address] [place], [place], [address] Phone: [phone]   Fax:  [phone]

## 2014-10-25 ENCOUNTER — Ambulatory Visit: Payer: Medicare Other | Admitting: Rehabilitative and Restorative Service Providers"

## 2014-11-23 ENCOUNTER — Other Ambulatory Visit: Payer: Self-pay | Admitting: Cardiovascular Disease

## 2014-12-02 ENCOUNTER — Encounter: Payer: Self-pay | Admitting: Occupational Therapy

## 2014-12-02 NOTE — Therapy (Signed)
[facility] [address] [place], [place], [address] Phone: [phone]   Fax:  [phone]  Patient Details  Name: Andrea Yu MRN: [id number] Date of Birth: 07-24-1940 Referring Provider:  No ref. provider found  Encounter Date: 12/02/2014   Pt did not return for therapy following initial evaluation on 10/21/14, therefore will resolve episode of care  Kelli Churn, OTR/L 12/02/2014, 9:59 AM  [facility] [address] [place], [place], [address] Phone: [phone]   Fax:  [phone]

## 2014-12-25 ENCOUNTER — Encounter: Payer: Self-pay | Admitting: Rehabilitative and Restorative Service Providers"

## 2014-12-25 NOTE — Therapy (Signed)
[facility] [address] [place], [place], [address] Phone: [phone]   Fax:  [phone]  Patient Details  Name: Andrea Yu MRN: [id number] Date of Birth: 1940/06/12 Referring Provider:  No ref. provider found  Encounter Date: last encounter 10/21/2014  PHYSICAL THERAPY DISCHARGE SUMMARY  Visits from Start of Care: 9  Current functional level related to goals / functional outcomes:     PT Short Term Goals - 10/21/14 1241    PT SHORT TERM GOAL #1   Title The patient will be independent with HEP for habituation, general mobility, and balance.   Baseline Met per report on 10/21/2014   Time 4   Period Weeks   Status Achieved   PT SHORT TERM GOAL #2   Title The patient will have negative R sidelying test indicating resolution of R BPPV.   Baseline Met on 09/24/2014 with patient feeling vertigo resolved.   Time 4   Period Weeks   Status Achieved   PT SHORT TERM GOAL #3   Title The patient will move sit<>stand modified indep to RW.   Baseline Met on 10/02/2014   Time 4   Period Weeks   Status Achieved   PT SHORT TERM GOAL #4   Title The patient will ambulate with supervision with RW x 100 ft in order to return to household ambulation.   Baseline Target date 10/24/2014   Time 4   Period Weeks   Status Achieved   PT SHORT TERM GOAL #5   Title The patient will be further assessed on Berg, gait speed once vertigo treated and LTGs to follow.   Baseline Met on 09/24/2014 with [name] 11/56 and gait speed=0.97 ft/sec    Time 4   Period Weeks   Status Achieved         PT Long Term Goals - 09/23/14 2137    PT LONG TERM GOAL #1   Title The patient will be independent with progression of HEP for post d/c activity.   Baseline Target date 11/23/2014   Time 8   Period Weeks   PT LONG TERM GOAL #2   Title The patient will improve Berg score by 6 points to demonstrate improved steady state balance for ADLs.   Baseline  Target date 11/23/2014   Time 8   Period Weeks   PT LONG TERM GOAL #3   Title The patient will improve gait speed by 0.5 ft/sec to demonstrate improved functional ambulation for household surfaces.   Baseline Target date 11/23/2014   Time 8   Period Weeks   PT LONG TERM GOAL #4   Title The patient will ambulate x 200 feet nonstop with RW modified indep.   Baseline Target date 11/23/2014   Time 8   Period Weeks   PT LONG TERM GOAL #5   Title The patient will verbalize understanding of fall prevention strategies for home safety.   Baseline Target date 11/23/2014   Time 8   Period Weeks        Remaining deficits: See eval-plan of care in PT not completed due to the patient holding due to husband's surgery   Education / Equipment: HEP, safe walking.  Plan: Patient agrees to discharge.  Patient goals were not met.STGs assessed. Patient is being discharged due to                                                     ?????  patient not able to attend due to husband's surgery.        Thank you for the referral of this patient. Rudell Cobb, MPT   [place] 12/25/2014, 12:36 PM  [facility] [address] [place], [place], [address] Phone: [phone]   Fax:  [phone]

## 2015-02-13 ENCOUNTER — Ambulatory Visit: Payer: Medicare Other | Admitting: Cardiovascular Disease

## 2015-02-17 ENCOUNTER — Other Ambulatory Visit: Payer: Self-pay | Admitting: Cardiovascular Disease

## 2015-02-17 ENCOUNTER — Encounter: Payer: Self-pay | Admitting: Cardiovascular Disease

## 2015-02-17 ENCOUNTER — Ambulatory Visit (INDEPENDENT_AMBULATORY_CARE_PROVIDER_SITE_OTHER): Payer: Medicare Other | Admitting: Cardiovascular Disease

## 2015-02-17 VITALS — BP 106/68 | HR 86 | Ht 59.0 in | Wt 192.3 lb

## 2015-02-17 DIAGNOSIS — I4819 Other persistent atrial fibrillation: Secondary | ICD-10-CM

## 2015-02-17 DIAGNOSIS — E669 Obesity, unspecified: Secondary | ICD-10-CM

## 2015-02-17 DIAGNOSIS — G6289 Other specified polyneuropathies: Secondary | ICD-10-CM | POA: Diagnosis not present

## 2015-02-17 DIAGNOSIS — I951 Orthostatic hypotension: Secondary | ICD-10-CM

## 2015-02-17 DIAGNOSIS — I481 Persistent atrial fibrillation: Secondary | ICD-10-CM

## 2015-02-17 DIAGNOSIS — L4050 Arthropathic psoriasis, unspecified: Secondary | ICD-10-CM

## 2015-02-17 NOTE — Progress Notes (Signed)
Patient ID: Andrea Yu, female   DOB: 26-Aug-1940, 75 y.o.   MRN: [id number]    Cardiology Office Note    Date:  02/18/2015   ID:  Andrea Yu, DOB Nov 19, 1940, MRN [id number]  PCP:  Garlan Fillers, MD  Cardiologist:   Thurmon Fair, MD   Chief Complaint  Patient presents with  . Follow-up    PAF//pt c/o swelling in bilateral feet--neuropathy, arthritis//no other Sx.    History of Present Illness:  Andrea Yu is a 75 y.o. female with a long-standing history of hypertension and paroxysmal atrial fibrillation, recently plagued by frequent falls due to a combination of peripheral neuropathy and orthostatic hypotension. We stopped her anticoagulation after she had multiple falls. She was hospitalized about 4 months ago for traumatic subarachnoid hemorrhage after a fall. Thankfully she was only on aspirin at the time. During the hospitalization, her rhythm was atrial fibrillation.  Today her electrocardiogram shows paroxysmal atrial fibrillation with controlled ventricular response. She appears to be oblivious to the arrhythmia. Since her last appointment, losartan has been stopped, I think due to orthostatic hypotension. Her complaints center around severe neuropathy in her feet. She has very poor sensation in her soles. She has the sensation of some type of round object under the middle of her sole which makes her rock back and forth. Ambulation is further impaired by bilateral knee arthritis and the inability to really efficiently use a walker due to severe shoulder arthritis which limits the range of motion her upper extremities.  She denies full-blown syncope. Most of her falls seem to be related to loss of balance rather than dizziness/presyncope.    Past Medical History  Diagnosis Date  . Hypertension   . Peripheral neuropathy (HCC)     "from back OR in 2005; in hands and feet" (11/07/2012)  . Anemia   . History of blood transfusion     "after 1 of my knee ORs and today"  (11/07/2012)  . A-fib (HCC)     "just after knee 2nd OR" (11/07/2012)  . Psoriatic arthritis (HCC)     on Prednisone Rx    Past Surgical History  Procedure Laterality Date  . Back surgery    . Replacement total knee bilateral Bilateral 2007  . Cholecystectomy  2000's  . Joint replacement    . Vaginal hysterectomy  1983  . Tubal ligation  1974  . Cataract extraction w/ intraocular lens  implant, bilateral Bilateral 2007  . Posterior lumbar fusion  2005    "got a rod and 4 pins in there" (11/07/2012)  . Colonoscopy N/A 11/10/2012    Procedure: COLONOSCOPY;  Surgeon: Theda Belfast, MD;  Location: [facility] ENDOSCOPY;  Service: Endoscopy;  Laterality: N/A;  . Esophagogastroduodenoscopy N/A 11/10/2012    Procedure: ESOPHAGOGASTRODUODENOSCOPY (EGD);  Surgeon: Theda Belfast, MD;  Location: [facility] ENDOSCOPY;  Service: Endoscopy;  Laterality: N/A;  . Transthoracic echocardiogram  05/24/2006    Outpatient Prescriptions Prior to Visit  Medication Sig Dispense Refill  . acetaminophen (TYLENOL) 325 MG tablet Take 2 tablets (650 mg total) by mouth every 4 (four) hours as needed. (Patient taking differently: Take 650 mg by mouth every 4 (four) hours as needed for mild pain. )    . BIOTIN 5000 PO Take 5,000 mg by mouth at bedtime.     . cyclobenzaprine (FLEXERIL) 5 MG tablet Take 1 tablet (5 mg total) by mouth 3 (three) times daily as needed for muscle spasms. 10 tablet 0  . diltiazem (CARDIZEM CD)  180 MG 24 hr capsule Take 1 capsule (180 mg total) by mouth daily.    . DULoxetine (CYMBALTA) 30 MG capsule Take along with 60 mg. Total 90 mg    . DULoxetine (CYMBALTA) 60 MG capsule Take 60 mg by mouth daily. Take along with 30mg  capsule to make a total of 90mg     . furosemide (LASIX) 40 MG tablet Take 80 mg by mouth daily.     Marland Kitchen gabapentin (NEURONTIN) 600 MG tablet Take 600-1,200 mg by mouth 2 (two) times daily. 1 tablet in morning and 2 tablets at night    . HYDROcodone-acetaminophen (NORCO/VICODIN) 5-325 MG  per tablet Take 1 tablet by mouth every 6 (six) hours as needed for pain.    . Magnesium 250 MG TABS Take 1 tablet by mouth daily.    . Multiple Vitamin (MULTIVITAMIN WITH MINERALS) TABS Take 1 tablet by mouth daily.    Marland Kitchen omeprazole (PRILOSEC) 40 MG capsule Take 1 capsule (40 mg total) by mouth daily. 30 capsule 0  . potassium chloride SA (K-DUR,KLOR-CON) 20 MEQ tablet Take 40 mEq by mouth 2 (two) times daily.     . predniSONE (DELTASONE) 5 MG tablet Take 7.5 mg by mouth daily.     Marland Kitchen PROAIR HFA 108 (90 BASE) MCG/ACT inhaler Inhale 1-2 puffs into the lungs every 6 (six) hours as needed for wheezing or shortness of breath.     . Vitamin D, Ergocalciferol, (DRISDOL) 50000 UNITS CAPS Take 50,000 Units by mouth every 7 (seven) days. Tuesday    . metoprolol succinate (TOPROL-XL) 100 MG 24 hr tablet TAKE 1 TABLET BY MOUTH  DAILY. TAKE WITH OR  IMMEDIATELY FOLLOWING A  MEAL. 90 tablet 0  . etanercept (ENBREL) 50 MG/ML injection Inject 50 mg into the skin once a week. Tuesday     No facility-administered medications prior to visit.     Allergies:   Prolia and Penicillins   Social History   Social History  . Marital Status: Married    Spouse Name: N/A  . Number of Children: N/A  . Years of Education: N/A   Social History Main Topics  . Smoking status: Former Smoker -- 0.50 packs/day for 30 years    Types: Cigarettes    Quit date: 08/09/1981  . Smokeless tobacco: Never Used  . Alcohol Use: No  . Drug Use: No  . Sexual Activity: Not Currently   Other Topics Concern  . None   Social History Narrative     Family History:  The patient's family history includes Aneurysm (age of onset: 66) in her son; Heart attack in her father; Liver disease in her mother.   ROS:   Please see the history of present illness.    ROS All other systems reviewed and are negative.   PHYSICAL EXAM:   VS:  BP 106/68 mmHg  Pulse 86  Ht 4\' 11"  (1.499 m)  Wt 192 lb 4.8 oz (87.227 kg)  BMI 38.82 kg/m2   GEN:  Well nourished, well developed, in no acute distress HEENT: normal Neck: no JVD, carotid bruits, or masses Cardiac:  Irregular, NOmurmurs, rubs, or gallops,no edema  Respiratory:  clear to auscultation bilaterally, normal work of breathing GI: soft, nontender, nondistended, + BS MS: no deformity or atrophy; Did range of motion in her shoulders Skin: warm and dry, no rash Neuro:  Alert and Oriented x 3, Strength and sensation are intact. She has evidence of significant muscle atrophy especially in her calves and feet but also  in her hands Psych: euthymic mood, full affect  Wt Readings from Last 3 Encounters:  02/17/15 192 lb 4.8 oz (87.227 kg)  09/14/14 190 lb 8 oz (86.41 kg)  02/01/14 187 lb 6.4 oz (85.004 kg)      Studies/Labs Reviewed:   EKG:  EKG is ordered today.  The ekg ordered today demonstrates Atrial fibrillation with controlled rate, otherwise normal  Recent Labs: 09/14/2014: BUN 11; Creatinine, Ser 0.73; Hemoglobin 12.8; Platelets 288; Potassium 3.8; Sodium 140   ASSESSMENT:    1. Persistent atrial fibrillation (HCC)   2. Orthostatic hypotension   3. Obesity (BMI 30-39.9)- suspected sleep apnea, declines sleep study   4. Other polyneuropathy (HCC)   5. Psoriatic arthritis (HCC)      PLAN:  In order of problems listed above:  1. AFib: At this point she has at least long-term persistent atrial fibrillation and may actually have settled into a pattern of permanent atrial fibrillation. The current rate control medications are doing a good job, but unfortunately may be contributing to a tendency to hypotension. At this point I would continue them unchanged. Other options for rate control are less desirable (digoxin and amiodarone) due to side effects. Clearly her tendency to fall with serious injury makes the use of anticoagulation prohibitively risky. If she had been on anticoagulants last summer, her intracranial hemorrhage could well have been fatal  2.  she previously  had systemic hypertension, now mostly plagued by orthostatic hypotension. Some degree of adrenal insufficiency due to chronic use of corticosteroids may be contribute eating, but neuropathy is the major cause  3.  still not interested in sleep study  4.  I don't think the exact etiology of her neuropathy has ever been identified. 5. Chronic prednisone and Humira for psoriatic arthritis   Medication Adjustments/Labs and Tests Ordered: Current medicines are reviewed at length with the patient today.  Concerns regarding medicines are outlined above.  Medication changes, Labs and Tests ordered today are listed in the Patient Instructions below. Patient Instructions  Dr. Royann Shivers recommends that you schedule a follow-up appointment in: 6 MONTHS      Signed, Glenn Gullickson, MD  02/18/2015 5:32 PM    [facility] [address] [place], [place], [place]  [address] Phone: [phone]; Fax: [phone]

## 2015-02-17 NOTE — Patient Instructions (Signed)
Dr. Croitoru recommends that you schedule a follow-up appointment in: 6 MONTHS   

## 2015-02-17 NOTE — Telephone Encounter (Signed)
Rx request sent to pharmacy.  

## 2015-05-24 ENCOUNTER — Other Ambulatory Visit: Payer: Self-pay | Admitting: Cardiovascular Disease

## 2015-05-26 ENCOUNTER — Other Ambulatory Visit: Payer: Self-pay

## 2015-05-26 MED ORDER — FUROSEMIDE 40 MG PO TABS: 80.0000 mg | ORAL_TABLET | Freq: Every day | ORAL | Status: DC

## 2015-08-20 ENCOUNTER — Other Ambulatory Visit: Payer: Self-pay | Admitting: Anesthesiology

## 2015-08-20 DIAGNOSIS — M48061 Spinal stenosis, lumbar region without neurogenic claudication: Secondary | ICD-10-CM

## 2015-08-29 ENCOUNTER — Other Ambulatory Visit: Payer: Self-pay | Admitting: Orthopedic Surgery

## 2015-08-29 DIAGNOSIS — L97309 Non-pressure chronic ulcer of unspecified ankle with unspecified severity: Secondary | ICD-10-CM

## 2015-09-01 ENCOUNTER — Ambulatory Visit
Admission: RE | Admit: 2015-09-01 | Discharge: 2015-09-01 | Disposition: A | Discharge status: Home / Self Care | Payer: Medicare Other | Source: Ambulatory Visit | Attending: Anesthesiology | Admitting: Anesthesiology

## 2015-09-01 DIAGNOSIS — M48061 Spinal stenosis, lumbar region without neurogenic claudication: Secondary | ICD-10-CM

## 2015-09-05 ENCOUNTER — Ambulatory Visit (HOSPITAL_COMMUNITY)
Admission: RE | Admit: 2015-09-05 | Discharge: 2015-09-05 | Disposition: A | Discharge status: Home / Self Care | Payer: Medicare Other | Source: Ambulatory Visit | Attending: Cardiovascular Disease | Admitting: Cardiovascular Disease

## 2015-09-05 ENCOUNTER — Ambulatory Visit (INDEPENDENT_AMBULATORY_CARE_PROVIDER_SITE_OTHER): Payer: Medicare Other | Admitting: Cardiovascular Disease

## 2015-09-05 ENCOUNTER — Encounter: Payer: Self-pay | Admitting: Cardiovascular Disease

## 2015-09-05 VITALS — BP 104/66 | HR 79 | Ht 59.0 in | Wt 192.0 lb

## 2015-09-05 DIAGNOSIS — L97309 Non-pressure chronic ulcer of unspecified ankle with unspecified severity: Secondary | ICD-10-CM | POA: Diagnosis not present

## 2015-09-05 DIAGNOSIS — I10 Essential (primary) hypertension: Secondary | ICD-10-CM

## 2015-09-05 DIAGNOSIS — I480 Paroxysmal atrial fibrillation: Secondary | ICD-10-CM

## 2015-09-05 NOTE — Progress Notes (Signed)
Patient ID: Andrea Yu, female   DOB: 1940/10/05, 75 y.o.   MRN: [id number]    Cardiology Office Note    Date:  09/05/2015   ID:  Andrea Yu, DOB 06/26/1940, MRN [id number]  PCP:  Garlan Fillers, MD  Cardiologist:   Thurmon Fair, MD   No chief complaint on file.   History of Present Illness:  Andrea Yu is a 75 y.o. female with a long-standing history of hypertension and paroxysmal atrial fibrillation, recently plagued by frequent falls due to a combination of peripheral neuropathy and orthostatic hypotension. We stopped her anticoagulation after she had multiple falls. She was hospitalized last year for traumatic subarachnoid hemorrhage after a fall. Thankfully she was only on aspirin at the time.   Today her electrocardiogram shows paroxysmal atrial fibrillation with controlled ventricular response. She appears to be oblivious to the arrhythmia. Neuropathy continues to seriously limit her mobility. She is in a scooter today. She has an ulcer on the external surface of her right malleolus following trauma. Because of the open ulceration her Humira has been stopped. She is being evaluated at the wound center.  She denies full-blown syncope. Most of her falls seem to be related to loss of balance rather than dizziness/presyncope.    Past Medical History:  Diagnosis Date  . A-fib (HCC)    "just after knee 2nd OR" (11/07/2012)  . Anemia   . History of blood transfusion    "after 1 of my knee ORs and today" (11/07/2012)  . Hypertension   . Peripheral neuropathy (HCC)    "from back OR in 2005; in hands and feet" (11/07/2012)  . Psoriatic arthritis (HCC)    on Prednisone Rx    Past Surgical History:  Procedure Laterality Date  . BACK SURGERY    . CATARACT EXTRACTION W/ INTRAOCULAR LENS  IMPLANT, BILATERAL Bilateral 2007  . CHOLECYSTECTOMY  2000's  . COLONOSCOPY N/A 11/10/2012   Procedure: COLONOSCOPY;  Surgeon: Theda Belfast, MD;  Location: [facility] ENDOSCOPY;  Service:  Endoscopy;  Laterality: N/A;  . ESOPHAGOGASTRODUODENOSCOPY N/A 11/10/2012   Procedure: ESOPHAGOGASTRODUODENOSCOPY (EGD);  Surgeon: Theda Belfast, MD;  Location: [facility] ENDOSCOPY;  Service: Endoscopy;  Laterality: N/A;  . JOINT REPLACEMENT    . POSTERIOR LUMBAR FUSION  2005   "got a rod and 4 pins in there" (11/07/2012)  . REPLACEMENT TOTAL KNEE BILATERAL Bilateral 2007  . TRANSTHORACIC ECHOCARDIOGRAM  05/24/2006  . TUBAL LIGATION  1974  . VAGINAL HYSTERECTOMY  1983    Outpatient Medications Prior to Visit  Medication Sig Dispense Refill  . acetaminophen (TYLENOL) 325 MG tablet Take 2 tablets (650 mg total) by mouth every 4 (four) hours as needed. (Patient taking differently: Take 650 mg by mouth every 4 (four) hours as needed for mild pain. )    . BIOTIN 5000 PO Take 5,000 mg by mouth at bedtime.     . cyclobenzaprine (FLEXERIL) 5 MG tablet Take 1 tablet (5 mg total) by mouth 3 (three) times daily as needed for muscle spasms. 10 tablet 0  . diltiazem (CARDIZEM CD) 180 MG 24 hr capsule Take 1 capsule (180 mg total) by mouth daily.    . DULoxetine (CYMBALTA) 30 MG capsule Take along with 60 mg. Total 90 mg    . DULoxetine (CYMBALTA) 60 MG capsule Take 60 mg by mouth daily. Take along with 30mg  capsule to make a total of 90mg     . furosemide (LASIX) 40 MG tablet Take 2 tablets (80 mg total)  by mouth daily. 180 tablet 2  . gabapentin (NEURONTIN) 600 MG tablet Take 600-1,200 mg by mouth 2 (two) times daily. 1 tablet in morning and 2 tablets at night    . HYDROcodone-acetaminophen (NORCO/VICODIN) 5-325 MG per tablet Take 1 tablet by mouth every 6 (six) hours as needed for pain.    . Magnesium 250 MG TABS Take 1 tablet by mouth daily.    . metoprolol succinate (TOPROL-XL) 100 MG 24 hr tablet TAKE 1 TABLET BY MOUTH  DAILY WITH OR IMMEDIATLEY  FOLLOWING A MEAL 90 tablet 0  . Multiple Vitamin (MULTIVITAMIN WITH MINERALS) TABS Take 1 tablet by mouth daily.    Marland Kitchen omeprazole (PRILOSEC) 40 MG capsule Take 1  capsule (40 mg total) by mouth daily. 30 capsule 0  . potassium chloride SA (K-DUR,KLOR-CON) 20 MEQ tablet Take 40 mEq by mouth 2 (two) times daily.     . predniSONE (DELTASONE) 5 MG tablet Take 7.5 mg by mouth daily.     Marland Kitchen PROAIR HFA 108 (90 BASE) MCG/ACT inhaler Inhale 1-2 puffs into the lungs every 6 (six) hours as needed for wheezing or shortness of breath.     . Vitamin D, Ergocalciferol, (DRISDOL) 50000 UNITS CAPS Take 50,000 Units by mouth every 7 (seven) days. Tuesday    . HUMIRA PEN 40 MG/0.8ML PNKT Inject 1 Dose as directed every 21 ( twenty-one) days. On Tuesdays.     No facility-administered medications prior to visit.      Allergies:   Prolia [denosumab] and Penicillins   Social History   Social History  . Marital status: Married    Spouse name: N/A  . Number of children: N/A  . Years of education: N/A   Social History Main Topics  . Smoking status: Former Smoker    Packs/day: 0.50    Years: 30.00    Types: Cigarettes    Quit date: 08/09/1981  . Smokeless tobacco: Never Used  . Alcohol use No  . Drug use: No  . Sexual activity: Not Currently   Other Topics Concern  . None   Social History Narrative  . None     Family History:  The patient's family history includes Aneurysm (age of onset: 40) in her son; Heart attack in her father; Liver disease in her mother.   ROS:   Please see the history of present illness.    ROS All other systems reviewed and are negative.   PHYSICAL EXAM:   VS:  BP 104/66   Pulse 79   Ht 4\' 11"  (1.499 m)   Wt 192 lb (87.1 kg)   BMI 38.78 kg/m    GEN: Well nourished, well developed, in no acute distress  HEENT: normal  Neck: no JVD, carotid bruits, or masses Cardiac:  Irregular, NOmurmurs, rubs, or gallops,no edema  Respiratory:  clear to auscultation bilaterally, normal work of breathing GI: soft, nontender, nondistended, + BS MS: no deformity or atrophy ; Did range of motion in her shoulders Skin: warm and dry, no  rash Neuro:  Alert and Oriented x 3, Strength and sensation are intact. She has evidence of significant muscle atrophy especially in her calves and feet but also in her hands Psych: euthymic mood, full affect  Wt Readings from Last 3 Encounters:  09/05/15 192 lb (87.1 kg)  02/17/15 192 lb 4.8 oz (87.2 kg)  09/14/14 190 lb 8 oz (86.4 kg)      Studies/Labs Reviewed:   EKG:  EKG is ordered today.  The ekg  ordered today demonstrates Atrial fibrillation with controlled rate, otherwise normal  Recent Labs: 09/14/2014: BUN 11; Creatinine, Ser 0.73; Hemoglobin 12.8; Platelets 288; Potassium 3.8; Sodium 140   ASSESSMENT:    1. PAF (paroxysmal atrial fibrillation) (HCC)   2. Essential hypertension      PLAN:  In order of problems listed above:  1. AFib: At this point she has at least long-term persistent atrial fibrillation and may actually have settled into a pattern of permanent atrial fibrillation. The current rate control medications are doing a good job, but unfortunately may be contributing to a tendency to hypotension. At this point I would continue them unchanged. Other options for rate control are less desirable (digoxin and amiodarone) due to side effects. Clearly her tendency to fall with serious injury makes the use of anticoagulation prohibitively risky.  2. HTN: she previously had systemic hypertension, now mostly plagued by orthostatic hypotension. Some degree of adrenal insufficiency due to chronic use of corticosteroids may be contributing, but neuropathy is the major cause     Medication Adjustments/Labs and Tests Ordered: Current medicines are reviewed at length with the patient today.  Concerns regarding medicines are outlined above.  Medication changes, Labs and Tests ordered today are listed in the Patient Instructions below. Patient Instructions  Dr Royann Shivers recommends that you schedule a follow-up appointment in 6 months. You will receive a reminder letter in the  mail two months in advance. If you don't receive a letter, please call our office to schedule the follow-up appointment.  If you need a refill on your cardiac medications before your next appointment, please call your pharmacy.    Signed, Thurmon Fair, MD  09/05/2015 5:15 PM    [facility] [address] [place], [place], [place]  [address] Phone: [phone]; Fax: [phone]

## 2015-09-05 NOTE — Patient Instructions (Signed)
Dr Croitoru recommends that you schedule a follow-up appointment in 6 months. You will receive a reminder letter in the mail two months in advance. If you don't receive a letter, please call our office to schedule the follow-up appointment.  If you need a refill on your cardiac medications before your next appointment, please call your pharmacy. 

## 2015-10-07 ENCOUNTER — Other Ambulatory Visit (HOSPITAL_COMMUNITY): Payer: Self-pay | Admitting: Family

## 2015-10-11 ENCOUNTER — Other Ambulatory Visit: Payer: Self-pay | Admitting: Cardiovascular Disease

## 2015-10-13 NOTE — Telephone Encounter (Signed)
Rx(s) sent to pharmacy electronically.  

## 2015-10-20 ENCOUNTER — Other Ambulatory Visit (HOSPITAL_COMMUNITY): Payer: Medicare Other

## 2015-10-22 ENCOUNTER — Inpatient Hospital Stay: Admit: 2015-10-22 | Payer: Medicare Other | Admitting: Orthopedic Surgery

## 2015-10-22 SURGERY — ANKLE FUSION
Anesthesia: Regional | Laterality: Right

## 2015-11-14 ENCOUNTER — Encounter: Payer: Self-pay | Admitting: Vascular Surgery

## 2015-11-18 ENCOUNTER — Ambulatory Visit (INDEPENDENT_AMBULATORY_CARE_PROVIDER_SITE_OTHER): Payer: Medicare Other | Admitting: Vascular Surgery

## 2015-11-18 ENCOUNTER — Encounter: Payer: Self-pay | Admitting: Vascular Surgery

## 2015-11-18 VITALS — BP 119/72 | HR 83 | Temp 99.1°F | Resp 18 | Ht 59.0 in | Wt 192.0 lb

## 2015-11-18 DIAGNOSIS — L97312 Non-pressure chronic ulcer of right ankle with fat layer exposed: Secondary | ICD-10-CM

## 2015-11-18 NOTE — Progress Notes (Signed)
[facility] [place]  Patient name: Andrea Yu MRN: [id number] DOB: 02-20-1940 Sex: female  REASON FOR CONSULT: Evaluate arterial flow to lower extremities  HPI: Andrea Yu is a 75 y.o. female, who is seen today for evaluation of lower extremity arterial flow. She is a very pleasant every 12-year-old who is here today with her husband and daughter. I know her husband from prior carotid surgery in the past. She has many medical issues. 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 Chronic lower extremity swelling. She has had the knee replacement bilaterally in 2005. She has open ulcer which is quite chronic and draining on her right lateral malleolus. She has seen Dr.Duda who is recommended agree for treatment of this. She is seen today for evaluation of her peripheral arterial flow.  Past Medical History:  Diagnosis Date  . A-fib (HCC)    "just after knee 2nd OR" (11/07/2012)  . Anemia   . History of blood transfusion    "after 1 of my knee ORs and today" (11/07/2012)  . Hypertension   . Peripheral neuropathy (HCC)    "from back OR in 2005; in hands and feet" (11/07/2012)  . Psoriatic arthritis (HCC)    on Prednisone Rx    Family History  Problem Relation Age of Onset  . Heart attack Father     1951-deceased   . Liver disease Mother   . Aneurysm Son 33    Deceased     SOCIAL HISTORY: Social History   Social History  . Marital status: Married    Spouse name: N/A  . Number of children: N/A  . Years of education: N/A   Occupational History  . Not on file.   Social History Main Topics  . Smoking status: Former Smoker    Packs/day: 0.50    Years: 30.00    Types: Cigarettes    Quit date: 08/09/1981  . Smokeless tobacco: Never Used  . Alcohol use No  . Drug use: No  . Sexual activity: Not Currently   Other Topics Concern  . Not on  file   Social History Narrative  . No narrative on file    Allergies  Allergen Reactions  . Prolia [Denosumab] Other (See Comments)    Severe Pain in the groin area.  . Fosamax [Alendronate Sodium]   . Penicillins Other (See Comments)    Throat swelling     Current Outpatient Prescriptions  Medication Sig Dispense Refill  . aspirin 81 MG tablet Take 81 mg by mouth daily.    Marland Kitchen BIOTIN 5000 PO Take 5,000 mg by mouth at bedtime.     . DULoxetine (CYMBALTA) 30 MG capsule Take along with 60 mg. Total 90 mg    . DULoxetine (CYMBALTA) 60 MG capsule Take 60 mg by mouth daily. Take along with 30mg  capsule to make a total of 90mg     . Etanercept (ENBREL [place]) Inject into the skin once a week.    . furosemide (LASIX) 40 MG tablet Take 2 tablets (80 mg total) by mouth daily. 180 tablet 2  . gabapentin (NEURONTIN) 600 MG tablet Take 600-1,200 mg by mouth 2 (two) times daily. 1 tablet in morning and 2 tablets at night    . HUMIRA PEN 40 MG/0.8ML PNKT Inject 1 Dose as directed every 21 ( twenty-one) days. On Tuesdays.    HYDROcodone-acetaminophen (NORCO/VICODIN) 5-325 MG per tablet Take  1 tablet by mouth every 6 (six) hours as needed for pain.    Marland Kitchen losartan (COZAAR) 100 MG tablet Take 100 mg by mouth daily.     . Magnesium 250 MG TABS Take 1 tablet by mouth daily.    . metoprolol succinate (TOPROL-XL) 100 MG 24 hr tablet TAKE 1 TABLET BY MOUTH  DAILY WITH OR IMMEDIATLEY  FOLLOWING A MEAL 90 tablet 3  . Multiple Vitamin (MULTIVITAMIN WITH MINERALS) TABS Take 1 tablet by mouth daily.    Marland Kitchen omeprazole (PRILOSEC) 40 MG capsule Take 1 capsule (40 mg total) by mouth daily. 30 capsule 0  . potassium chloride SA (K-DUR,KLOR-CON) 20 MEQ tablet Take 40 mEq by mouth 2 (two) times daily.     . predniSONE (DELTASONE) 5 MG tablet Take 7.5 mg by mouth daily.     Marland Kitchen PROAIR HFA 108 (90 BASE) MCG/ACT inhaler Inhale 1-2 puffs into the lungs every 6 (six) hours as needed for wheezing or shortness of breath.     .  Vitamin D, Ergocalciferol, (DRISDOL) 50000 UNITS CAPS Take 50,000 Units by mouth every 7 (seven) days. Tuesday    . acetaminophen (TYLENOL) 325 MG tablet Take 2 tablets (650 mg total) by mouth every 4 (four) hours as needed. (Patient not taking: Reported on 11/18/2015)    . cyclobenzaprine (FLEXERIL) 5 MG tablet Take 1 tablet (5 mg total) by mouth 3 (three) times daily as needed for muscle spasms. (Patient not taking: Reported on 11/18/2015) 10 tablet 0  . diltiazem (CARDIZEM CD) 180 MG 24 hr capsule Take 1 capsule (180 mg total) by mouth daily. (Patient not taking: Reported on 11/18/2015)     No current facility-administered medications for this visit.     REVIEW OF SYSTEMS:  [X]  denotes positive finding, [ ]  denotes negative finding Cardiac  Comments:  Chest pain or chest pressure:    Shortness of breath upon exertion: x   Short of breath when lying flat:    Irregular heart rhythm:        Vascular    Pain in calf, thigh, or hip brought on by ambulation:    Pain in feet at night that wakes you up from your sleep:     Blood clot in your veins:    Leg swelling:  x       Pulmonary    Oxygen at home:    Productive cough:     Wheezing:         Neurologic    Sudden weakness in arms or legs:     Sudden numbness in arms or legs:     Sudden onset of difficulty speaking or slurred speech:    Temporary loss of vision in one eye:     Problems with dizziness:         Gastrointestinal    Blood in stool:     Vomited blood:         Genitourinary    Burning when urinating:     Blood in urine:        Psychiatric    Major depression:         Hematologic    Bleeding problems:    Problems with blood clotting too easily:        Skin    Rashes or ulcers:        Constitutional    Fever or chills:      PHYSICAL EXAM: Vitals:   11/18/15 1038  BP: 119/72  Pulse: 83  Resp: 18  Temp: 99.1 F (37.3 C)  TempSrc: Oral  SpO2: 92%  Weight: 192 lb (87.1 kg)  Height: 4\' 11"  (1.499 m)      GENERAL: The patient is a well-nourished female, in no acute distress. The vital signs are documented above. CARDIOVASCULAR: 2+ radial pulses bilaterally. I do not palpate pedal pulses although she does have extensive peripheral edema extending into her feet PULMONARY: There is good air exchange  ABDOMEN: Soft and non-tender  MUSCULOSKELETAL: There are no major deformities or cyanosis. NEUROLOGIC: No focal weakness or paresthesias are detected. SKIN: Does have an open ulcer that extends down to the malleolus on her lateral right ankle PSYCHIATRIC: The patient has a normal affect.  DATA:  Reviewed her noninvasive arterial studies from August 2017. This reveals normal ankle arm index bilaterally and normal triphasic waveforms bilaterally  Until Doppler shows normal Doppler signal at her dorsalis pedis bilaterally. Has dampened posterior tibial and peroneal signals bilaterally  MEDICAL ISSUES: Lung discussion with patient and her husband and daughter present. I do not feel she has any limitation to healing surgery on her right ankle related to arterial insufficiency. I explained that due to her severe arthritis and other comorbidities I feel that she would still be at risk for difficulty healing this and the potential amputation. Explained that without surgery I felt the she had a very high risk of this proceeding to invasive infection requiring amputation. She understands and will contact Dr. September 2017 for further planning. We will see her again on an as-needed basis   Lajoyce Corners, MD [facility] Tel [phone] Pager [phone]

## 2015-11-25 ENCOUNTER — Telehealth (INDEPENDENT_AMBULATORY_CARE_PROVIDER_SITE_OTHER): Payer: Self-pay

## 2015-11-25 ENCOUNTER — Other Ambulatory Visit (INDEPENDENT_AMBULATORY_CARE_PROVIDER_SITE_OTHER): Payer: Self-pay | Admitting: Orthopedic Surgery

## 2015-11-25 NOTE — Telephone Encounter (Signed)
Pt was sch for a ankle fusion on 10/22/15 and this was cancelled pending a vascular consult. This was just obtained on 11/18/15 and the risks and benefits were discussed with the pt and she was then advised to contact our office to set this up marion has sent an email to erin to follow up on this.

## 2015-11-26 NOTE — Telephone Encounter (Signed)
I called Ms. Andrea Yu and discussed possible dates for surgery.  She is aware that we are in the process of scheduling.

## 2015-11-26 NOTE — Telephone Encounter (Signed)
Discussed with Elnita Maxwell and she has already spoken with pt.

## 2015-11-27 ENCOUNTER — Other Ambulatory Visit (INDEPENDENT_AMBULATORY_CARE_PROVIDER_SITE_OTHER): Payer: Self-pay | Admitting: Orthopedic Surgery

## 2015-11-27 MED ORDER — DOXYCYCLINE HYCLATE 100 MG PO CAPS: 100.0000 mg | ORAL_CAPSULE | Freq: Two times a day (BID) | ORAL | 0 refills | Status: DC

## 2015-11-27 NOTE — Telephone Encounter (Signed)
Ok refill? 

## 2015-12-03 ENCOUNTER — Other Ambulatory Visit (INDEPENDENT_AMBULATORY_CARE_PROVIDER_SITE_OTHER): Payer: Self-pay | Admitting: Family

## 2015-12-22 ENCOUNTER — Inpatient Hospital Stay (HOSPITAL_COMMUNITY)
Admission: RE | Admit: 2015-12-22 | Discharge: 2015-12-22 | Disposition: A | Discharge status: Home / Self Care | Payer: Medicare Other | Source: Ambulatory Visit

## 2015-12-22 NOTE — Pre-Procedure Instructions (Addendum)
    Andrea Yu  12/22/2015      [id number] - [place], [place] - [address] AT [facility] [address] [place] [address] Phone: [phone] Fax: [phone]  [facility] - [place], [place] - [address] [facility] [address] [place] [address] [place] [address] Phone: [phone] Fax: [phone]    Your procedure is scheduled on December 13th, Wednesday   Report to [facility] Admitting at 6:30 AM             (posted surgery time 8:30 am - 9:43 am)   Call this number if you have problems the MORNING of surgery:   [phone]   Remember:  Do not eat food or drink liquids after midnight Tuesday.   Take these medicines the morning of surgery with A SIP OF WATER: Cymbalta, Hydrocodone, Metoprolol,  Please use inhaler(s) the morning of surgery.               4-5 days prior to surgery, STOP TAKING any blood thinners, herbal supplements, anti-inflammatories Vitamins   Do not wear jewelry, make-up or nail polish.  Do not wear lotions, powders,  perfumes, or deoderant.   Do not shave underarms 48 hours prior to surgery.    Do not bring valuables to the hospital.  [facility] is not responsible for any belongings or valuables.  Contacts, dentures or bridgework may not be worn into surgery.  Leave your suitcase in the car.  After surgery it may be brought to your room.  For patients admitted to the hospital, discharge time will be determined by your treatment team.  Patients discharged the day of surgery will not be allowed to drive home.   Please read over the following fact sheets that you were given. Pain Booklet

## 2015-12-23 ENCOUNTER — Other Ambulatory Visit (INDEPENDENT_AMBULATORY_CARE_PROVIDER_SITE_OTHER): Payer: Self-pay | Admitting: Family

## 2015-12-29 ENCOUNTER — Telehealth (INDEPENDENT_AMBULATORY_CARE_PROVIDER_SITE_OTHER): Payer: Self-pay | Admitting: Orthopedic Surgery

## 2015-12-29 NOTE — Telephone Encounter (Signed)
Pateint requesting refill of doxycycline. Please give pt a call when medication has been filled.

## 2015-12-30 ENCOUNTER — Other Ambulatory Visit (INDEPENDENT_AMBULATORY_CARE_PROVIDER_SITE_OTHER): Payer: Self-pay | Admitting: Orthopedic Surgery

## 2015-12-30 MED ORDER — DOXYCYCLINE HYCLATE 100 MG PO CAPS: 100.0000 mg | ORAL_CAPSULE | Freq: Two times a day (BID) | ORAL | 0 refills | Status: DC

## 2015-12-30 NOTE — Telephone Encounter (Signed)
rx sent, [facility]

## 2015-12-31 NOTE — Telephone Encounter (Signed)
I called patient to advise rx sent to pharmacy. She is aware.

## 2016-01-05 ENCOUNTER — Encounter (HOSPITAL_COMMUNITY): Payer: Self-pay

## 2016-01-05 ENCOUNTER — Encounter (HOSPITAL_COMMUNITY)
Admission: RE | Admit: 2016-01-05 | Discharge: 2016-01-05 | Disposition: A | Discharge status: Home / Self Care | Payer: Medicare Other | Source: Ambulatory Visit | Attending: Orthopedic Surgery | Admitting: Orthopedic Surgery

## 2016-01-05 DIAGNOSIS — L4050 Arthropathic psoriasis, unspecified: Secondary | ICD-10-CM | POA: Diagnosis not present

## 2016-01-05 DIAGNOSIS — Z01812 Encounter for preprocedural laboratory examination: Secondary | ICD-10-CM | POA: Insufficient documentation

## 2016-01-05 DIAGNOSIS — K219 Gastro-esophageal reflux disease without esophagitis: Secondary | ICD-10-CM | POA: Diagnosis not present

## 2016-01-05 DIAGNOSIS — Z9841 Cataract extraction status, right eye: Secondary | ICD-10-CM | POA: Diagnosis not present

## 2016-01-05 DIAGNOSIS — G629 Polyneuropathy, unspecified: Secondary | ICD-10-CM | POA: Diagnosis not present

## 2016-01-05 DIAGNOSIS — D649 Anemia, unspecified: Secondary | ICD-10-CM | POA: Diagnosis not present

## 2016-01-05 DIAGNOSIS — I10 Essential (primary) hypertension: Secondary | ICD-10-CM | POA: Diagnosis not present

## 2016-01-05 DIAGNOSIS — Z87891 Personal history of nicotine dependence: Secondary | ICD-10-CM | POA: Diagnosis not present

## 2016-01-05 DIAGNOSIS — Z961 Presence of intraocular lens: Secondary | ICD-10-CM | POA: Diagnosis not present

## 2016-01-05 DIAGNOSIS — M069 Rheumatoid arthritis, unspecified: Secondary | ICD-10-CM | POA: Diagnosis not present

## 2016-01-05 DIAGNOSIS — Z90710 Acquired absence of both cervix and uterus: Secondary | ICD-10-CM | POA: Diagnosis not present

## 2016-01-05 DIAGNOSIS — Z880 Allergy status to penicillin: Secondary | ICD-10-CM | POA: Diagnosis not present

## 2016-01-05 DIAGNOSIS — Z7982 Long term (current) use of aspirin: Secondary | ICD-10-CM | POA: Diagnosis not present

## 2016-01-05 DIAGNOSIS — Z8249 Family history of ischemic heart disease and other diseases of the circulatory system: Secondary | ICD-10-CM | POA: Diagnosis not present

## 2016-01-05 DIAGNOSIS — Z888 Allergy status to other drugs, medicaments and biological substances status: Secondary | ICD-10-CM | POA: Diagnosis not present

## 2016-01-05 DIAGNOSIS — Z96653 Presence of artificial knee joint, bilateral: Secondary | ICD-10-CM | POA: Diagnosis not present

## 2016-01-05 DIAGNOSIS — M86271 Subacute osteomyelitis, right ankle and foot: Secondary | ICD-10-CM | POA: Diagnosis present

## 2016-01-05 DIAGNOSIS — Z981 Arthrodesis status: Secondary | ICD-10-CM | POA: Diagnosis not present

## 2016-01-05 DIAGNOSIS — Z9842 Cataract extraction status, left eye: Secondary | ICD-10-CM | POA: Diagnosis not present

## 2016-01-05 DIAGNOSIS — I4891 Unspecified atrial fibrillation: Secondary | ICD-10-CM | POA: Diagnosis not present

## 2016-01-05 DIAGNOSIS — M12571 Traumatic arthropathy, right ankle and foot: Secondary | ICD-10-CM | POA: Diagnosis not present

## 2016-01-05 HISTORY — DX: Adverse effect of unspecified anesthetic, initial encounter: T41.45XA

## 2016-01-05 HISTORY — DX: Concussion with loss of consciousness status unknown, initial encounter: S06.0XAA

## 2016-01-05 HISTORY — DX: Cardiac arrhythmia, unspecified: I49.9

## 2016-01-05 HISTORY — DX: Other complications of anesthesia, initial encounter: T88.59XA

## 2016-01-05 HISTORY — DX: Gastro-esophageal reflux disease without esophagitis: K21.9

## 2016-01-05 HISTORY — DX: Concussion with loss of consciousness of unspecified duration, initial encounter: S06.0X9A

## 2016-01-05 LAB — BASIC METABOLIC PANEL
Anion gap: 11 (ref 5–15)
BUN: 11 mg/dL (ref 6–20)
CALCIUM: 9.8 mg/dL (ref 8.9–10.3)
CO2: 26 mmol/L (ref 22–32)
CREATININE: 0.84 mg/dL (ref 0.44–1.00)
Chloride: 103 mmol/L (ref 101–111)
GFR calc Af Amer: >60 mL/min (ref >60)
GLUCOSE: 106 mg/dL — AB (ref 65–99)
Potassium: 3.9 mmol/L (ref 3.5–5.1)
Sodium: 140 mmol/L (ref 135–145)

## 2016-01-05 LAB — CBC
HCT: 33 % — ABNORMAL LOW (ref 36.0–46.0)
Hemoglobin: 10 g/dL — ABNORMAL LOW (ref 12.0–15.0)
MCH: 25.8 pg — AB (ref 26.0–34.0)
MCHC: 30.3 g/dL (ref 30.0–36.0)
MCV: 85.3 fL (ref 78.0–100.0)
Platelets: 420 10*3/uL — ABNORMAL HIGH (ref 150–400)
RBC: 3.87 MIL/uL (ref 3.87–5.11)
RDW: 15.4 % (ref 11.5–15.5)
WBC: 16.2 10*3/uL — ABNORMAL HIGH (ref 4.0–10.5)

## 2016-01-05 LAB — SURGICAL PCR SCREEN
MRSA, PCR: NEGATIVE
STAPHYLOCOCCUS AUREUS: NEGATIVE

## 2016-01-05 NOTE — Pre-Procedure Instructions (Signed)
    MEMPHIS CRESWELL  01/05/2016    Your procedure is scheduled on Wednesday, December 13.  Report to [facility] Admitting at 6:30 AM                Your surgery or procedure is scheduled for 8:30 AM   Call this number if you have problems the morning of [phone]    Remember:  Do not eat food or drink liquids after midnight Tuesday, December 12.  Take these medicines the morning of surgery with A SIP OF WATER: diltiazem (CARDIZEM CD), Apremilast (OTEZLA), DULoxetine (CYMBALTA), gabapentin (NEURONTIN), metoprolol succinate (TOPROL-XL), omeprazole (PRILOSEC).                May take acetaminophen (TYLENOL) if needed.  May use PROAIR and bring inhaler to the hospital with you.              1 Week prior to surgery STOP taking Aspirin , Aspirin Products (Goody Powder, Excedrin Migraine), Ibuprofen (Advil), Naproxen (Aleve), Viiamins and Herbal Products (ie Fish Oil)   Do not wear jewelry, make-up or nail polish.  Do not wear lotions, powders, or perfumes, or deodorant.  Do not shave 48 hours prior to surgery.    Do not bring valuables to the hospital.  [facility] is not responsible for any belongings or valuables.  Contacts, dentures or bridgework may not be worn into surgery.  Leave your suitcase in the car.  After surgery it may be brought to your room.  Special instructions:  Review  [facility] - Preparing For Surgery.  Please read over the following fact sheets that you were given. [facility]- Preparing For Surgery and Patient Instructions for Mupirocin Application, Incentive Spirometry, Pain Booklet

## 2016-01-06 MED ORDER — CLINDAMYCIN PHOSPHATE 900 MG/50ML IV SOLN
900.0000 mg | INTRAVENOUS | Status: AC
  Administered 2016-01-07: 900 mg via INTRAVENOUS
  Filled 2016-01-06: qty 50

## 2016-01-07 ENCOUNTER — Inpatient Hospital Stay (HOSPITAL_COMMUNITY): Payer: Medicare Other | Admitting: Certified Registered"

## 2016-01-07 ENCOUNTER — Encounter (HOSPITAL_COMMUNITY): Payer: Self-pay | Admitting: *Deleted

## 2016-01-07 ENCOUNTER — Encounter (HOSPITAL_COMMUNITY)
Admission: RE | Disposition: A | Discharge status: Skilled Nursing Facility | Payer: Self-pay | Source: Ambulatory Visit | Attending: Orthopedic Surgery

## 2016-01-07 ENCOUNTER — Observation Stay (HOSPITAL_COMMUNITY)
Admission: RE | Admit: 2016-01-07 | Discharge: 2016-01-09 | Disposition: A | Discharge status: Skilled Nursing Facility | Payer: Medicare Other | Source: Ambulatory Visit | Attending: Orthopedic Surgery | Admitting: Orthopedic Surgery

## 2016-01-07 DIAGNOSIS — Z888 Allergy status to other drugs, medicaments and biological substances status: Secondary | ICD-10-CM | POA: Insufficient documentation

## 2016-01-07 DIAGNOSIS — M869 Osteomyelitis, unspecified: Secondary | ICD-10-CM

## 2016-01-07 DIAGNOSIS — M12571 Traumatic arthropathy, right ankle and foot: Secondary | ICD-10-CM | POA: Insufficient documentation

## 2016-01-07 DIAGNOSIS — Z961 Presence of intraocular lens: Secondary | ICD-10-CM | POA: Insufficient documentation

## 2016-01-07 DIAGNOSIS — L4050 Arthropathic psoriasis, unspecified: Secondary | ICD-10-CM | POA: Insufficient documentation

## 2016-01-07 DIAGNOSIS — Z9842 Cataract extraction status, left eye: Secondary | ICD-10-CM | POA: Insufficient documentation

## 2016-01-07 DIAGNOSIS — M069 Rheumatoid arthritis, unspecified: Secondary | ICD-10-CM | POA: Insufficient documentation

## 2016-01-07 DIAGNOSIS — Z96653 Presence of artificial knee joint, bilateral: Secondary | ICD-10-CM | POA: Insufficient documentation

## 2016-01-07 DIAGNOSIS — D649 Anemia, unspecified: Secondary | ICD-10-CM | POA: Insufficient documentation

## 2016-01-07 DIAGNOSIS — Z7982 Long term (current) use of aspirin: Secondary | ICD-10-CM | POA: Insufficient documentation

## 2016-01-07 DIAGNOSIS — Z8249 Family history of ischemic heart disease and other diseases of the circulatory system: Secondary | ICD-10-CM | POA: Insufficient documentation

## 2016-01-07 DIAGNOSIS — I10 Essential (primary) hypertension: Secondary | ICD-10-CM | POA: Insufficient documentation

## 2016-01-07 DIAGNOSIS — M86271 Subacute osteomyelitis, right ankle and foot: Secondary | ICD-10-CM | POA: Diagnosis not present

## 2016-01-07 DIAGNOSIS — Z981 Arthrodesis status: Secondary | ICD-10-CM | POA: Insufficient documentation

## 2016-01-07 DIAGNOSIS — Z880 Allergy status to penicillin: Secondary | ICD-10-CM | POA: Insufficient documentation

## 2016-01-07 DIAGNOSIS — I4891 Unspecified atrial fibrillation: Secondary | ICD-10-CM | POA: Insufficient documentation

## 2016-01-07 DIAGNOSIS — Z9841 Cataract extraction status, right eye: Secondary | ICD-10-CM | POA: Insufficient documentation

## 2016-01-07 DIAGNOSIS — G629 Polyneuropathy, unspecified: Secondary | ICD-10-CM | POA: Insufficient documentation

## 2016-01-07 DIAGNOSIS — K219 Gastro-esophageal reflux disease without esophagitis: Secondary | ICD-10-CM | POA: Insufficient documentation

## 2016-01-07 DIAGNOSIS — Z90710 Acquired absence of both cervix and uterus: Secondary | ICD-10-CM | POA: Insufficient documentation

## 2016-01-07 DIAGNOSIS — Z87891 Personal history of nicotine dependence: Secondary | ICD-10-CM | POA: Insufficient documentation

## 2016-01-07 HISTORY — PX: ANKLE FUSION: SHX5718

## 2016-01-07 SURGERY — ANKLE FUSION
Anesthesia: Monitor Anesthesia Care | Site: Ankle | Laterality: Right

## 2016-01-07 MED ORDER — GABAPENTIN 600 MG PO TABS
1200.0000 mg | ORAL_TABLET | Freq: Every day | ORAL | Status: DC
  Administered 2016-01-07 – 2016-01-08 (×2): 1200 mg via ORAL
  Filled 2016-01-07 (×2): qty 2

## 2016-01-07 MED ORDER — DULOXETINE HCL 60 MG PO CPEP
90.0000 mg | ORAL_CAPSULE | Freq: Every day | ORAL | Status: DC
  Administered 2016-01-08 – 2016-01-09 (×2): 90 mg via ORAL
  Filled 2016-01-07 (×2): qty 1

## 2016-01-07 MED ORDER — METHOCARBAMOL 500 MG PO TABS
500.0000 mg | ORAL_TABLET | Freq: Four times a day (QID) | ORAL | Status: DC | PRN
  Administered 2016-01-08: 500 mg via ORAL
  Filled 2016-01-07: qty 1

## 2016-01-07 MED ORDER — ROPIVACAINE HCL 7.5 MG/ML IJ SOLN
INTRAMUSCULAR | Status: DC | PRN
  Administered 2016-01-07: 5 mL via PERINEURAL
  Administered 2016-01-07: 10 mL via PERINEURAL

## 2016-01-07 MED ORDER — ONDANSETRON HCL 4 MG/2ML IJ SOLN: 4.0000 mg | Freq: Four times a day (QID) | INTRAMUSCULAR | Status: DC | PRN

## 2016-01-07 MED ORDER — PROPOFOL 10 MG/ML IV BOLUS
INTRAVENOUS | Status: AC
  Filled 2016-01-07: qty 20

## 2016-01-07 MED ORDER — POTASSIUM CHLORIDE CRYS ER 20 MEQ PO TBCR
20.0000 meq | EXTENDED_RELEASE_TABLET | Freq: Two times a day (BID) | ORAL | Status: DC
  Administered 2016-01-07 – 2016-01-09 (×5): 20 meq via ORAL
  Filled 2016-01-07 (×5): qty 1

## 2016-01-07 MED ORDER — OXYCODONE HCL 5 MG PO TABS: 5.0000 mg | ORAL_TABLET | ORAL | Status: DC | PRN

## 2016-01-07 MED ORDER — CHLORHEXIDINE GLUCONATE 4 % EX LIQD: 60.0000 mL | Freq: Once | CUTANEOUS | Status: DC

## 2016-01-07 MED ORDER — BUPIVACAINE-EPINEPHRINE (PF) 0.5% -1:200000 IJ SOLN
INTRAMUSCULAR | Status: DC | PRN
  Administered 2016-01-07: 10 mL via PERINEURAL
  Administered 2016-01-07: 20 mL via PERINEURAL

## 2016-01-07 MED ORDER — CLINDAMYCIN PHOSPHATE 600 MG/50ML IV SOLN
600.0000 mg | Freq: Four times a day (QID) | INTRAVENOUS | Status: DC
  Filled 2016-01-07 (×2): qty 50

## 2016-01-07 MED ORDER — LACTATED RINGERS IV SOLN
INTRAVENOUS | Status: DC | PRN
  Administered 2016-01-07: 08:00:00 via INTRAVENOUS

## 2016-01-07 MED ORDER — ROCURONIUM BROMIDE 50 MG/5ML IV SOSY
PREFILLED_SYRINGE | INTRAVENOUS | Status: AC
  Filled 2016-01-07: qty 5

## 2016-01-07 MED ORDER — 0.9 % SODIUM CHLORIDE (POUR BTL) OPTIME
TOPICAL | Status: DC | PRN
  Administered 2016-01-07: 1000 mL

## 2016-01-07 MED ORDER — DULOXETINE HCL 60 MG PO CPEP: 60.0000 mg | ORAL_CAPSULE | Freq: Every day | ORAL | Status: DC

## 2016-01-07 MED ORDER — METOCLOPRAMIDE HCL 5 MG/ML IJ SOLN: 5.0000 mg | Freq: Three times a day (TID) | INTRAMUSCULAR | Status: DC | PRN

## 2016-01-07 MED ORDER — GABAPENTIN 600 MG PO TABS
600.0000 mg | ORAL_TABLET | Freq: Every day | ORAL | Status: DC
  Administered 2016-01-08 – 2016-01-09 (×2): 600 mg via ORAL
  Filled 2016-01-07 (×2): qty 1

## 2016-01-07 MED ORDER — PHENYLEPHRINE HCL 10 MG/ML IJ SOLN
INTRAVENOUS | Status: DC | PRN
  Administered 2016-01-07: 75 ug/min via INTRAVENOUS

## 2016-01-07 MED ORDER — METOPROLOL SUCCINATE ER 100 MG PO TB24
100.0000 mg | ORAL_TABLET | Freq: Every day | ORAL | Status: DC
  Administered 2016-01-08 – 2016-01-09 (×2): 100 mg via ORAL
  Filled 2016-01-07 (×2): qty 1

## 2016-01-07 MED ORDER — LIDOCAINE 2% (20 MG/ML) 5 ML SYRINGE
INTRAMUSCULAR | Status: AC
  Filled 2016-01-07: qty 5

## 2016-01-07 MED ORDER — HYDROMORPHONE HCL 2 MG/ML IJ SOLN: 1.0000 mg | INTRAMUSCULAR | Status: DC | PRN

## 2016-01-07 MED ORDER — ACETAMINOPHEN 650 MG RE SUPP: 650.0000 mg | Freq: Four times a day (QID) | RECTAL | Status: DC | PRN

## 2016-01-07 MED ORDER — ALBUTEROL SULFATE (2.5 MG/3ML) 0.083% IN NEBU: 2.5000 mg | INHALATION_SOLUTION | Freq: Four times a day (QID) | RESPIRATORY_TRACT | Status: DC | PRN

## 2016-01-07 MED ORDER — PREDNISONE 5 MG PO TABS: 5.0000 mg | ORAL_TABLET | Freq: Every day | ORAL | Status: DC

## 2016-01-07 MED ORDER — ALBUMIN HUMAN 5 % IV SOLN
12.5000 g | Freq: Once | INTRAVENOUS | Status: AC
  Administered 2016-01-07: 12.5 g via INTRAVENOUS

## 2016-01-07 MED ORDER — DILTIAZEM HCL ER COATED BEADS 180 MG PO CP24
180.0000 mg | ORAL_CAPSULE | Freq: Every evening | ORAL | Status: DC
  Administered 2016-01-07 – 2016-01-08 (×2): 180 mg via ORAL
  Filled 2016-01-07 (×2): qty 1

## 2016-01-07 MED ORDER — FUROSEMIDE 40 MG PO TABS
80.0000 mg | ORAL_TABLET | Freq: Every day | ORAL | Status: DC
  Administered 2016-01-07 – 2016-01-09 (×3): 80 mg via ORAL
  Filled 2016-01-07 (×3): qty 2

## 2016-01-07 MED ORDER — ACETAMINOPHEN 325 MG PO TABS
650.0000 mg | ORAL_TABLET | Freq: Four times a day (QID) | ORAL | Status: DC | PRN
  Administered 2016-01-07 – 2016-01-09 (×6): 650 mg via ORAL
  Filled 2016-01-07 (×5): qty 2

## 2016-01-07 MED ORDER — LOSARTAN POTASSIUM 50 MG PO TABS
100.0000 mg | ORAL_TABLET | Freq: Every day | ORAL | Status: DC
  Administered 2016-01-08 – 2016-01-09 (×2): 100 mg via ORAL
  Filled 2016-01-07 (×2): qty 2

## 2016-01-07 MED ORDER — DEXTROSE 5 % IV SOLN
500.0000 mg | Freq: Four times a day (QID) | INTRAVENOUS | Status: DC | PRN
  Filled 2016-01-07: qty 5

## 2016-01-07 MED ORDER — PANTOPRAZOLE SODIUM 40 MG PO TBEC
40.0000 mg | DELAYED_RELEASE_TABLET | Freq: Every day | ORAL | Status: DC
  Administered 2016-01-07 – 2016-01-09 (×3): 40 mg via ORAL
  Filled 2016-01-07 (×3): qty 1

## 2016-01-07 MED ORDER — PHENYLEPHRINE 40 MCG/ML (10ML) SYRINGE FOR IV PUSH (FOR BLOOD PRESSURE SUPPORT)
PREFILLED_SYRINGE | INTRAVENOUS | Status: AC
  Filled 2016-01-07: qty 10

## 2016-01-07 MED ORDER — ONDANSETRON HCL 4 MG PO TABS: 4.0000 mg | ORAL_TABLET | Freq: Four times a day (QID) | ORAL | Status: DC | PRN

## 2016-01-07 MED ORDER — METOCLOPRAMIDE HCL 5 MG PO TABS: 5.0000 mg | ORAL_TABLET | Freq: Three times a day (TID) | ORAL | Status: DC | PRN

## 2016-01-07 MED ORDER — FENTANYL CITRATE (PF) 100 MCG/2ML IJ SOLN
INTRAMUSCULAR | Status: DC | PRN
  Administered 2016-01-07 (×2): 50 ug via INTRAVENOUS

## 2016-01-07 MED ORDER — MIDAZOLAM HCL 2 MG/2ML IJ SOLN
INTRAMUSCULAR | Status: AC
  Filled 2016-01-07: qty 2

## 2016-01-07 MED ORDER — APREMILAST 30 MG PO TABS
30.0000 mg | ORAL_TABLET | Freq: Two times a day (BID) | ORAL | Status: DC
  Administered 2016-01-07 – 2016-01-08 (×3): 30 mg via ORAL
  Filled 2016-01-07 (×4): qty 1

## 2016-01-07 MED ORDER — PROPOFOL 500 MG/50ML IV EMUL
INTRAVENOUS | Status: DC | PRN
  Administered 2016-01-07: 50 ug/kg/min via INTRAVENOUS

## 2016-01-07 MED ORDER — ALBUMIN HUMAN 5 % IV SOLN
INTRAVENOUS | Status: AC
  Administered 2016-01-07: 12.5 g via INTRAVENOUS
  Filled 2016-01-07: qty 250

## 2016-01-07 MED ORDER — PHENYLEPHRINE HCL 10 MG/ML IJ SOLN
INTRAMUSCULAR | Status: DC | PRN
  Administered 2016-01-07: 160 ug via INTRAVENOUS
  Administered 2016-01-07 (×3): 80 ug via INTRAVENOUS

## 2016-01-07 MED ORDER — FENTANYL CITRATE (PF) 100 MCG/2ML IJ SOLN
INTRAMUSCULAR | Status: AC
  Filled 2016-01-07: qty 2

## 2016-01-07 MED ORDER — PREDNISONE 5 MG PO TABS
5.0000 mg | ORAL_TABLET | Freq: Every day | ORAL | Status: DC
  Administered 2016-01-08 – 2016-01-09 (×2): 5 mg via ORAL
  Filled 2016-01-07 (×2): qty 1

## 2016-01-07 MED ORDER — SODIUM CHLORIDE 0.9 % IV SOLN: INTRAVENOUS | Status: DC

## 2016-01-07 MED ORDER — MIDAZOLAM HCL 5 MG/5ML IJ SOLN
INTRAMUSCULAR | Status: DC | PRN
  Administered 2016-01-07 (×2): 1 mg via INTRAVENOUS

## 2016-01-07 MED ORDER — ASPIRIN EC 81 MG PO TBEC
81.0000 mg | DELAYED_RELEASE_TABLET | Freq: Every evening | ORAL | Status: DC
  Administered 2016-01-07 – 2016-01-08 (×2): 81 mg via ORAL
  Filled 2016-01-07 (×2): qty 1

## 2016-01-07 MED ORDER — CLINDAMYCIN PHOSPHATE 600 MG/50ML IV SOLN
600.0000 mg | Freq: Four times a day (QID) | INTRAVENOUS | Status: AC
  Administered 2016-01-07 (×3): 600 mg via INTRAVENOUS
  Filled 2016-01-07 (×3): qty 50

## 2016-01-07 SURGICAL SUPPLY — 50 items
BANDAGE ESMARK 6X9 LF (GAUZE/BANDAGES/DRESSINGS) ×1 IMPLANT
BIT DRILL CALIBRATED 4.2 (BIT) IMPLANT
BIT DRILL CALIBRATED 5.0 MM (BIT) IMPLANT
BLADE SAW SGTL HD 18.5X60.5X1. (BLADE) ×3 IMPLANT
BLADE SURG 10 STRL SS (BLADE) ×2 IMPLANT
BNDG CMPR 9X6 STRL LF SNTH (GAUZE/BANDAGES/DRESSINGS) ×1
BNDG COHESIVE 4X5 TAN STRL (GAUZE/BANDAGES/DRESSINGS) ×3 IMPLANT
BNDG ESMARK 6X9 LF (GAUZE/BANDAGES/DRESSINGS) ×3
BNDG GAUZE ELAST 4 BULKY (GAUZE/BANDAGES/DRESSINGS) ×3 IMPLANT
CAP END T25 HF ARTHRO NAIL EX (Cap) IMPLANT
COVER MAYO STAND STRL (DRAPES) IMPLANT
COVER SURGICAL LIGHT HANDLE (MISCELLANEOUS) ×4 IMPLANT
DRAPE OEC MINIVIEW 54X84 (DRAPES) ×2 IMPLANT
DRAPE U-SHAPE 47X51 STRL (DRAPES) ×3 IMPLANT
DRILL BIT CALIBRATED 4.2 (BIT) ×3
DRILL BIT CALIBRATED 5.0 MM (BIT) ×3
DRSG ADAPTIC 3X8 NADH LF (GAUZE/BANDAGES/DRESSINGS) ×3 IMPLANT
DRSG PAD ABDOMINAL 8X10 ST (GAUZE/BANDAGES/DRESSINGS) ×6 IMPLANT
DURAPREP 26ML APPLICATOR (WOUND CARE) ×3 IMPLANT
ELECT REM PT RETURN 9FT ADLT (ELECTROSURGICAL) ×3
ELECTRODE REM PT RTRN 9FT ADLT (ELECTROSURGICAL) ×1 IMPLANT
ENDCAP T25 HF ARTHRO NAIL EX (Cap) ×2 IMPLANT
GAUZE SPONGE 4X4 12PLY STRL (GAUZE/BANDAGES/DRESSINGS) ×3 IMPLANT
GLOVE BIOGEL PI IND STRL 9 (GLOVE) ×1 IMPLANT
GLOVE BIOGEL PI INDICATOR 9 (GLOVE) ×2
GLOVE SURG ORTHO 9.0 STRL STRW (GLOVE) ×3 IMPLANT
GOWN STRL REUS W/ TWL LRG LVL3 (GOWN DISPOSABLE) ×1 IMPLANT
GOWN STRL REUS W/ TWL XL LVL3 (GOWN DISPOSABLE) ×1 IMPLANT
GOWN STRL REUS W/TWL LRG LVL3 (GOWN DISPOSABLE) ×3
GOWN STRL REUS W/TWL XL LVL3 (GOWN DISPOSABLE) ×3
GUIDEWIRE 3.2X400 (WIRE) ×2 IMPLANT
KIT BASIN OR (CUSTOM PROCEDURE TRAY) ×3 IMPLANT
KIT ROOM TURNOVER OR (KITS) ×3 IMPLANT
NAIL EX CANN ARTHRODESIS 10MM (Nail) ×3 IMPLANT
NS IRRIG 1000ML POUR BTL (IV SOLUTION) ×3 IMPLANT
PACK ORTHO EXTREMITY (CUSTOM PROCEDURE TRAY) ×3 IMPLANT
PAD ARMBOARD 7.5X6 YLW CONV (MISCELLANEOUS) ×8 IMPLANT
REAMER ROD DEEP FLUTE 2.5X950 (INSTRUMENTS) ×2 IMPLANT
SCREW LOCK 6MMX76MM (Screw) ×2 IMPLANT
SCREW LOCKING 5.0X28MM (Screw) ×2 IMPLANT
SCREW LOCKING 6.0X68MM (Screw) ×3 IMPLANT
SPONGE LAP 18X18 X RAY DECT (DISPOSABLE) ×6 IMPLANT
SUCTION FRAZIER HANDLE 10FR (MISCELLANEOUS) ×2
SUCTION TUBE FRAZIER 10FR DISP (MISCELLANEOUS) ×1 IMPLANT
SUT ETHILON 2 0 PSLX (SUTURE) ×9 IMPLANT
TOWEL OR 17X24 6PK STRL BLUE (TOWEL DISPOSABLE) ×3 IMPLANT
TOWEL OR 17X26 10 PK STRL BLUE (TOWEL DISPOSABLE) ×3 IMPLANT
TUBE CONNECTING 12'X1/4 (SUCTIONS) ×1
TUBE CONNECTING 12X1/4 (SUCTIONS) ×2 IMPLANT
WATER STERILE IRR 1000ML POUR (IV SOLUTION) ×3 IMPLANT

## 2016-01-07 NOTE — H&P (Signed)
Andrea Yu is an 75 y.o. female.   Chief Complaint: Traumatic arthritis with collapse right ankle HPI: Patient is a 75 year old woman with rheumatoid arthritis with ulceration and collapse of her right ankle she has failed conservative care she has undergone vascular workup and has adequate circulation and presents at this time for tibial calcaneal fusion.  Past Medical History:  Diagnosis Date  . A-fib ([place])    "just after knee 2nd OR" (11/07/2012)  . Anemia   . Arthritis    RA , SACROTIC ARTHRITIS  . Complication of anesthesia    found to be in afib when she woke up  . Dysrhythmia   . GERD (gastroesophageal reflux disease)   . Head injury, closed, with concussion   . History of blood transfusion    "after 1 of my knee ORs and today" (11/07/2012)  . Hypertension   . Peripheral neuropathy ([place])    "from back OR in 2005; in hands and feet" (11/07/2012)  . Psoriatic arthritis ([place])    on Prednisone Rx    Past Surgical History:  Procedure Laterality Date  . BACK SURGERY    . CATARACT EXTRACTION W/ INTRAOCULAR LENS  IMPLANT, BILATERAL Bilateral 2007  . CHOLECYSTECTOMY  2000's  . COLONOSCOPY N/A 11/10/2012   Procedure: COLONOSCOPY;  Surgeon: Beryle Beams, MD;  Location: [facility];  Service: Endoscopy;  Laterality: N/A;  . ESOPHAGOGASTRODUODENOSCOPY N/A 11/10/2012   Procedure: ESOPHAGOGASTRODUODENOSCOPY (EGD);  Surgeon: Beryle Beams, MD;  Location: [facility] ENDOSCOPY;  Service: Endoscopy;  Laterality: N/A;  . EYE SURGERY    . JOINT REPLACEMENT    . POSTERIOR LUMBAR FUSION  2005   "got a rod and 4 pins in there" (11/07/2012)  . REPLACEMENT TOTAL KNEE BILATERAL Bilateral 2007  . TRANSTHORACIC ECHOCARDIOGRAM  05/24/2006  . TUBAL LIGATION  1974  . VAGINAL HYSTERECTOMY  1983    Family History  Problem Relation Age of Onset  . Heart attack Father     1951-deceased   . Liver disease Mother   . Aneurysm Son 63    Deceased    Social History:  reports that she quit smoking  about 34 years ago. Her smoking use included Cigarettes. She has a 15.00 pack-year smoking history. She has never used smokeless tobacco. She reports that she does not drink alcohol or use drugs.  Allergies:  Allergies  Allergen Reactions  . Penicillins Anaphylaxis    THROAT SWELLING  Has patient had a PCN reaction causing immediate rash, facial/tongue/throat swelling, SOB or lightheadedness with hypotension:  # # YES # #  Has patient had a PCN reaction causing severe rash involving mucus membranes or skin necrosis: # # # YES # # # Has patient had a PCN reaction that required hospitalization:No Has patient had a PCN reaction occurring within the last 10 years:  # # YES # #  If all of the above answers are "NO", then may proceed with Cephalosporin use.   [place] [Denosumab] Other (See Comments)    Severe Pain in the groin area.  . Fosamax [Alendronate Sodium]     UNSPECIFIED REACTION     Medications Prior to Admission  Medication Sig Dispense Refill  . acetaminophen (TYLENOL) 325 MG tablet Take 2 tablets (650 mg total) by mouth every 4 (four) hours as needed. (Patient taking differently: Take 325 mg by mouth every 6 (six) hours as needed (for pain.). )    . Apremilast (OTEZLA) 30 MG TABS Take 30 mg by mouth 2 (two)  times daily.    Marland Kitchen aspirin EC 81 MG tablet Take 81 mg by mouth every evening.    Marland Kitchen BIOTIN 5000 PO Take 5,000 mg by mouth at bedtime.     . collagenase (SANTYL) ointment Apply 1 application topically daily. Applied to sore/wound    . diltiazem (CARDIZEM CD) 180 MG 24 hr capsule Take 1 capsule (180 mg total) by mouth daily. (Patient taking differently: Take 180 mg by mouth every evening. )    . DULoxetine (CYMBALTA) 30 MG capsule Take along with 60 mg. Total 90 mg    . DULoxetine (CYMBALTA) 60 MG capsule Take 60 mg by mouth daily. Take along with 19m capsule to make a total of 984m   . furosemide (LASIX) 40 MG tablet Take 2 tablets (80 mg total) by mouth daily. 180 tablet 2   . gabapentin (NEURONTIN) 600 MG tablet Take 600-1,200 mg by mouth 2 (two) times daily. 1 tablet in morning and 2 tablets at night    . halobetasol (ULTRAVATE) 0.05 % ointment Apply 1 application topically 2 (two) times daily. For psorasis  3  . HYDROcodone-acetaminophen (NORCO/VICODIN) 5-325 MG per tablet Take 1-2 tablets by mouth every 6 (six) hours as needed (for pain (scheduled 1 tablet at bedtime)).     . Marland Kitchenosartan (COZAAR) 100 MG tablet Take 100 mg by mouth daily.     . Magnesium 250 MG TABS Take 1 tablet by mouth every evening.     . metoprolol succinate (TOPROL-XL) 100 MG 24 hr tablet TAKE 1 TABLET BY MOUTH  DAILY WITH OR IMMEDIATLEY  FOLLOWING A MEAL 90 tablet 3  . Multiple Vitamin (MULTIVITAMIN WITH MINERALS) TABS Take 1 tablet by mouth daily.    . Marland Kitchenmeprazole (PRILOSEC) 20 MG capsule Take 20 mg by mouth every evening.    . potassium chloride SA (K-DUR,KLOR-CON) 20 MEQ tablet Take 20 mEq by mouth 2 (two) times daily.     . predniSONE (DELTASONE) 5 MG tablet Take 5 mg by mouth daily.     . Marland KitchenROAIR HFA 108 (90 BASE) MCG/ACT inhaler Inhale 1-2 puffs into the lungs every 6 (six) hours as needed for wheezing or shortness of breath.     . Vitamin D, Ergocalciferol, (DRISDOL) 50000 UNITS CAPS Take 50,000 Units by mouth every Tuesday.     . Marland Kitchenoxycycline (VIBRAMYCIN) 100 MG capsule Take 1 capsule (100 mg total) by mouth 2 (two) times daily. 60 capsule 0  . HUMIRA PEN 40 MG/0.8ML PNKT Inject 40 mg as directed every 14 (fourteen) days. On Tuesdays.    . predniSONE (DELTASONE) 5 MG tablet Take 5 mg by mouth daily with breakfast.      Results for orders placed or performed during the hospital encounter of 01/05/16 (from the past 48 hour(s))  Surgical pcr screen     Status: None   Collection Time: 01/05/16  2:13 PM  Result Value Ref Range   MRSA, PCR NEGATIVE NEGATIVE   Staphylococcus aureus NEGATIVE NEGATIVE    Comment:        The Xpert SA Assay (FDA approved for NASAL specimens in patients over  2152ears of age), is one component of a comprehensive surveillance program.  Test performance has been validated by [facility] patients greater than or equal to 1 81ear old. It is not intended to diagnose infection nor to guide or monitor treatment.   Basic metabolic panel     Status: Abnormal   Collection Time: 01/05/16  2:14 PM  Result  Value Ref Range   Sodium 140 135 - 145 mmol/L   Potassium 3.9 3.5 - 5.1 mmol/L   Chloride 103 101 - 111 mmol/L   CO2 26 22 - 32 mmol/L   Glucose, Bld 106 (H) 65 - 99 mg/dL   BUN 11 6 - 20 mg/dL   Creatinine, Ser 0.84 0.44 - 1.00 mg/dL   Calcium 9.8 8.9 - 10.3 mg/dL   GFR calc non Af Amer >60 >60 mL/min   GFR calc Af Amer >60 >60 mL/min    Comment: (NOTE) The eGFR has been calculated using the CKD EPI equation. This calculation has not been validated in all clinical situations. eGFR's persistently <60 mL/min signify possible Chronic Kidney Disease.    Anion gap 11 5 - 15  CBC     Status: Abnormal   Collection Time: 01/05/16  2:14 PM  Result Value Ref Range   WBC 16.2 (H) 4.0 - 10.5 K/uL   RBC 3.87 3.87 - 5.11 MIL/uL   Hemoglobin 10.0 (L) 12.0 - 15.0 g/dL   HCT 33.0 (L) 36.0 - 46.0 %   MCV 85.3 78.0 - 100.0 fL   MCH 25.8 (L) 26.0 - 34.0 pg   MCHC 30.3 30.0 - 36.0 g/dL   RDW 15.4 11.5 - 15.5 %   Platelets 420 (H) 150 - 400 K/uL   No results found.  Review of Systems  All other systems reviewed and are negative.   There were no vitals taken for this visit. Physical Exam on examination patient is alert oriented no adenopathy well-dressed normal affect normal respiratory effort. She does have an antalgic gait. Patient has had a vascular workup and has adequate circulation. She has collapse and ulceration of her ankles she has pain directly daily living pain with range of motion of the ankle.  Assessment/Plan Assessment: Traumatic arthritis with collapse ulceration right ankle.  Plan: We'll plan for right tibial calcaneal fusion.  Risks and benefits were discussed including infection neurovascular injury nonhealing the wound need for additional surgery. Patient states she understands wish to proceed at this time.  Newt Minion, MD 01/07/2016, 6:43 AM

## 2016-01-07 NOTE — Progress Notes (Signed)
Pts BP remaining under 90, Dr Maple [facility] aware and fluid given

## 2016-01-07 NOTE — Anesthesia Postprocedure Evaluation (Signed)
Anesthesia Post Note  Patient: Andrea Yu  Procedure(s) Performed: Procedure(s) (LRB): Right Tibiocalcaneal Fusion (Right)  Patient location during evaluation: PACU Anesthesia Type: MAC and Regional Level of consciousness: awake Pain management: pain level controlled Vital Signs Assessment: post-procedure vital signs reviewed and stable Respiratory status: spontaneous breathing Cardiovascular status: stable Postop Assessment: no signs of nausea or vomiting Anesthetic complications: no    Last Vitals:  Vitals:   01/07/16 1200 01/07/16 1209  BP: 106/72   Pulse: (!) 120 66  Resp: 11 13  Temp:  36.7 C    Last Pain:  Vitals:   01/07/16 0735  TempSrc:   PainSc: 6                  Andrea Yu

## 2016-01-07 NOTE — Addendum Note (Signed)
Addendum  created 01/07/16 1240 by Val Eagle, MD   Anesthesia Intra Blocks edited, Sign clinical note

## 2016-01-07 NOTE — Anesthesia Preprocedure Evaluation (Signed)
Anesthesia Evaluation  Patient identified by MRN, date of birth, ID band Patient awake    Reviewed: Allergy & Precautions, NPO status , Patient's Chart, lab work & pertinent test results, reviewed documented beta blocker date and time   History of Anesthesia Complications Negative for: history of anesthetic complications  Airway Mallampati: II  TM Distance: >3 FB Neck ROM: Full    Dental  (+) Upper Dentures, Missing   Pulmonary neg shortness of breath, neg COPD, neg recent URI, former smoker,    breath sounds clear to auscultation       Cardiovascular hypertension, Pt. on medications and Pt. on home beta blockers + dysrhythmias Atrial Fibrillation  Rhythm:Irregular     Neuro/Psych PSYCHIATRIC DISORDERS Depression  Neuromuscular disease    GI/Hepatic GERD  Medicated,  Endo/Other    Renal/GU      Musculoskeletal  (+) Arthritis ,   Abdominal   Peds  Hematology  (+) anemia ,   Anesthesia Other Findings   Reproductive/Obstetrics                             Anesthesia Physical Anesthesia Plan  ASA: III  Anesthesia Plan: MAC and Regional   Post-op Pain Management:    Induction:   Airway Management Planned: Nasal Cannula, Natural Airway and Simple Face Mask  Additional Equipment: None  Intra-op Plan:   Post-operative Plan:   Informed Consent: I have reviewed the patients History and Physical, chart, labs and discussed the procedure including the risks, benefits and alternatives for the proposed anesthesia with the patient or authorized representative who has indicated his/her understanding and acceptance.   Dental advisory given  Plan Discussed with: Surgeon and CRNA  Anesthesia Plan Comments:         Anesthesia Quick Evaluation

## 2016-01-07 NOTE — Anesthesia Procedure Notes (Signed)
Anesthesia Regional Block:  Popliteal block  Pre-Anesthetic Checklist: ,, timeout performed, Correct Patient, Correct Site, Correct Laterality, Correct Procedure, Correct Position, site marked, Risks and benefits discussed,  Surgical consent,  Pre-op evaluation,  At surgeon's request and post-op pain management  Laterality: Lower and Right  Prep: chloraprep       Needles:  Injection technique: Single-shot  Needle Type: Echogenic Stimulator Needle          Additional Needles:  Procedures: ultrasound guided (picture in chart) Popliteal block Narrative:  Start time: 01/07/2016 8:21 AM End time: 01/07/2016 8:28 AM Injection made incrementally with aspirations every 5 mL.  Performed by: Personally  Anesthesiologist: Chesky Heyer  Additional Notes: H+P and labs reviewed, risks and benefits discussed with patient, procedure tolerated well without complications

## 2016-01-07 NOTE — Op Note (Signed)
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  SURGEON:  Nadara Mustard, MD  PHYSICIAN ASSISTANT:None ANESTHESIA:   General  PREOPERATIVE INDICATIONS:  [name] is a  75 y.o. female with a diagnosis of Osteomyelitis Right Fibula who failed conservative measures and elected for surgical management.    The risks benefits and alternatives were discussed with the patient preoperatively including but not limited to the risks of infection, bleeding, nerve injury, cardiopulmonary complications, the need for revision surgery, among others, and the patient was willing to proceed.  OPERATIVE IMPLANTS: Synthes nail 10 x 170 mm locked proximally and distally  OPERATIVE FINDINGS: Soft osteoporotic bone no deep abscess  OPERATIVE PROCEDURE: Patient brought to the operating room after undergoing a popliteal block. After adequate anesthesia were obtained patient was placed the left lateral decubitus position with the right side up and the right lower extremity was prepped using DuraPrep draped into a sterile field a timeout was called. A lateral incision was made over the fibula and the ulcerative tissue in the distal fibula were resected in 1 block of tissue. There is no signs of abscess. The distal tibia and talar dome were resected perpendicular to the long axis of the tibia. A guidewire was inserted from the calcaneus into the tibia alignment was checked with the C-arm fluoroscopy. This was then ove drilled and reamed to 11.5 mm for a 10 mm nail. The nail was inserted and locked distally 2 proximally 1 C-arm floss be verified alignment. The wound was irrigated with normal saline hemostasis was obtained the incisions were closed using 2-0 nylon. A  sterile compressive dressing was applied patient was taken to the PACU in stable condition.

## 2016-01-07 NOTE — Evaluation (Signed)
Physical Therapy Evaluation Patient Details Name: Andrea Yu MRN: [id number] DOB: 1940-05-11 Today's Date: 01/07/2016   History of Present Illness  Pt presents for right ankle fusion after failing conservative management of arthritis. PMH: SAH, a-fib, HTN, anemia, psoriatic arthritis, L TKA  Clinical Impression  Pt admitted with above diagnosis. Pt currently with functional limitations due to the deficits listed below (see PT Problem List). Pt requires +2 mod A for transfers, unable to keep RLE TDWB safely to ambulate.  Pt will benefit from skilled PT to increase their independence and safety with mobility to allow discharge to the venue listed below.       Follow Up Recommendations Home health PT;Supervision/Assistance - 24 hour    Equipment Recommendations  None recommended by PT    Recommendations for Other Services       Precautions / Restrictions Precautions Precautions: Fall Restrictions Weight Bearing Restrictions: Yes RLE Weight Bearing: Touchdown weight bearing      Mobility  Bed Mobility Overal bed mobility: Needs Assistance Bed Mobility: Rolling;Supine to Sit;Sit to Supine Rolling: Modified independent (Device/Increase time)   Supine to sit: Min assist Sit to supine: Min assist   General bed mobility comments: pt rolls well in bed. Needs reminding to not push with RLE. Min A at trunk for sup to sit. Min A to LE's for return to supine  Transfers Overall transfer level: Needs assistance Equipment used: Rolling walker (2 wheeled);None Transfers: Sit to/from [employer] Sit to Stand: +2 safety/equipment;Mod assist Stand pivot transfers: Mod assist;+2 safety/equipment       General transfer comment: mod A +2 for power up and to steady. Pt used RW to pivot to BSC, mod A to control sitting. Returned to bed with arm in arm assist and vc's for TDWB RLE  Ambulation/Gait             General Gait Details: pt with insufficient UE strength to  safely ambulate keeping RLE TDWB  Stairs            Wheelchair Mobility    Modified Rankin (Stroke Patients Only)       Balance Overall balance assessment: Needs assistance;History of Falls Sitting-balance support: No upper extremity supported;Feet supported Sitting balance-Leahy Scale: Fair Sitting balance - Comments: maintains balance during scooting, tends to scoot too close to EOB and has more difficulty scooting bkwds   Standing balance support: Bilateral upper extremity supported Standing balance-Leahy Scale: Poor Standing balance comment: unable to stand without UE support                             Pertinent Vitals/Pain Pain Assessment: Faces Faces Pain Scale: Hurts a little bit Pain Location: right ankle Pain Descriptors / Indicators: Operative site guarding Pain Intervention(s): Limited activity within patient's tolerance;Monitored during session;Premedicated before session    Home Living Family/patient expects to be discharged to:: Private residence Living Arrangements: Spouse/significant other Available Help at Discharge: Family;Available 24 hours/day Type of Home: House Home Access: Ramped entrance     Home Layout: One level Home Equipment: Insurance underwriter - 2 wheels;Transport chair;Wheelchair - manual;Cane - single point;Bedside commode Additional Comments: family available to help pt's husband. Pt did not walk much PTA    Prior Function Level of Independence: Needs assistance   Gait / Transfers Assistance Needed: short distances with RW  ADL's / Homemaking Assistance Needed: family assists because in the past pt fell trying to shower  Hand Dominance        Extremity/Trunk Assessment   Upper Extremity Assessment Upper Extremity Assessment: Defer to OT evaluation;Generalized weakness    Lower Extremity Assessment Lower Extremity Assessment: LLE deficits/detail LLE Deficits / Details: RLE generally pt's stronger  LE so needs frequent cues for TDWB. L knee buckles occasionally, strength grossly 3/5 throughout    Cervical / Trunk Assessment Cervical / Trunk Assessment: Kyphotic  Communication   Communication: No difficulties  Cognition Arousal/Alertness: Awake/alert Behavior During Therapy: WFL for tasks assessed/performed;Impulsive Overall Cognitive Status: Within Functional Limits for tasks assessed                 General Comments: pt impulsive with transfers    General Comments General comments (skin integrity, edema, etc.): O2 sats variable from low 80's to upper 90's on RA. Better when she was sitting up    Exercises     Assessment/Plan    PT Assessment Patient needs continued PT services  PT Problem List Decreased strength;Decreased activity tolerance;Decreased balance;Decreased mobility;Decreased coordination;Decreased knowledge of use of DME;Decreased knowledge of precautions;Pain;Obesity          PT Treatment Interventions DME instruction;Gait training;Functional mobility training;Therapeutic activities;Therapeutic exercise;Balance training;Patient/family education    PT Goals (Current goals can be found in the Care Plan section)  Acute Rehab PT Goals Patient Stated Goal: return home PT Goal Formulation: With patient Time For Goal Achievement: 01/21/16 Potential to Achieve Goals: Good    Frequency Min 5X/week   Barriers to discharge        Co-evaluation               End of Session Equipment Utilized During Treatment: Gait belt Activity Tolerance: Patient tolerated treatment well Patient left: in bed;with call bell/phone within reach;with family/visitor present Nurse Communication: Mobility status         Time: 7989-2119 PT Time Calculation (min) (ACUTE ONLY): 20 min   Charges:   PT Evaluation $PT Eval Moderate Complexity: 1 Procedure     PT G Codes:      Lyanne Co, PT  Acute Rehab Services  [phone]   [place] [facility]  Andrea Yu 01/07/2016, 4:55 PM

## 2016-01-07 NOTE — Progress Notes (Signed)
Pt continues to complain of urgency and frequent urination. Voided 120 mL x 2. Post void bladder scan showed 569 mL. Notified Dr. Lajoyce Corners and received verbal orders to I&O cath. Pt updated with plan. Will continue to monitor

## 2016-01-07 NOTE — Anesthesia Procedure Notes (Signed)
Anesthesia Regional Block:  Adductor canal block  Pre-Anesthetic Checklist: ,, timeout performed, Correct Patient, Correct Site, Correct Laterality, Correct Procedure, Correct Position, site marked, Risks and benefits discussed,  Surgical consent,  Pre-op evaluation,  At surgeon's request and post-op pain management  Laterality: Lower and Right  Prep: chloraprep       Needles:  Injection technique: Single-shot  Needle Type: Echogenic Stimulator Needle          Additional Needles:  Procedures: ultrasound guided (picture in chart) Adductor canal block Narrative:  Start time: 01/07/2016 8:14 AM End time: 01/07/2016 8:20 AM Injection made incrementally with aspirations every 5 mL.  Performed by: Personally  Anesthesiologist: Eleah Lahaie  Additional Notes: H+P and labs reviewed, risks and benefits discussed with patient, procedure tolerated well without complications

## 2016-01-07 NOTE — Transfer of Care (Signed)
Immediate Anesthesia Transfer of Care Note  Patient: Andrea Yu  Procedure(s) Performed: Procedure(s): Right Tibiocalcaneal Fusion (Right)  Patient Location: PACU  Anesthesia Type:MAC combined with regional for post-op pain  Level of Consciousness: awake, alert , oriented and patient cooperative  Airway & Oxygen Therapy: Patient Spontanous Breathing and Patient connected to nasal cannula oxygen  Post-op Assessment: Report given to RN and Post -op Vital signs reviewed and stable  Post vital signs: Reviewed and stable  Last Vitals:  Vitals:   01/07/16 0941 01/07/16 0944  BP: (!) 79/34 (!) 89/48  Pulse: 92   Resp: 15 17  Temp:      Last Pain:  Vitals:   01/07/16 0735  TempSrc:   PainSc: 6       Patients Stated Pain Goal: 4 (01/07/16 0735)  Complications: No apparent anesthesia complications

## 2016-01-08 ENCOUNTER — Telehealth (INDEPENDENT_AMBULATORY_CARE_PROVIDER_SITE_OTHER): Payer: Self-pay | Admitting: Orthopedic Surgery

## 2016-01-08 ENCOUNTER — Encounter (HOSPITAL_COMMUNITY): Payer: Self-pay | Admitting: Orthopedic Surgery

## 2016-01-08 DIAGNOSIS — M86271 Subacute osteomyelitis, right ankle and foot: Secondary | ICD-10-CM | POA: Diagnosis not present

## 2016-01-08 NOTE — Discharge Summary (Signed)
Discharge Diagnoses:  Active Problems:   S/P ankle fusion   Subacute osteomyelitis, right ankle and foot (HCC)   Surgeries: Procedure(s): Right Tibiocalcaneal Fusion on 01/07/2016    Consultants:  none  Discharged Condition: Improved  Hospital Course: Andrea Yu is an 75 y.o. female who was admitted 01/07/2016 with a chief complaint of osteomyelitis ulceration with varus collapse right ankle, with a final diagnosis of Osteomyelitis Right Fibula.  Patient was brought to the operating room on 01/07/2016 and underwent Procedure(s): Right Tibiocalcaneal Fusion.    Patient was given perioperative antibiotics: Anti-infectives    Start     Dose/Rate Route Frequency Ordered Stop   01/07/16 1430  clindamycin (CLEOCIN) IVPB 600 mg     600 mg 100 mL/hr over 30 Minutes Intravenous Every 6 hours 01/07/16 1248 01/07/16 2328   01/07/16 1230  clindamycin (CLEOCIN) IVPB 600 mg  Status:  Discontinued     600 mg 100 mL/hr over 30 Minutes Intravenous Every 6 hours 01/07/16 1224 01/07/16 1248   01/07/16 0800  clindamycin (CLEOCIN) IVPB 900 mg     900 mg 100 mL/hr over 30 Minutes Intravenous To ShortStay Surgical 01/06/16 1143 01/07/16 0830    .  Patient was given sequential compression devices, early ambulation, and aspirin for DVT prophylaxis.  Recent vital signs: Patient Vitals for the past 24 hrs:  BP Temp Temp src Pulse Resp SpO2  01/08/16 0503 (!) 111/53 (!) 100.7 F (38.2 C) Oral 99 16 94 %  01/08/16 0110 - 100 F (37.8 C) Oral - - -  01/08/16 0005 (!) 105/53 (!) 102.2 F (39 C) Oral 87 18 97 %  01/07/16 2259 - - - - - 95 %  01/07/16 2032 (!) 99/53 99.7 F (37.6 C) Oral 71 16 90 %  01/07/16 1236 (!) 92/50 97.6 F (36.4 C) Oral 77 18 98 %  01/07/16 1209 - 98 F (36.7 C) - 66 13 99 %  01/07/16 1200 106/72 - - (!) 120 11 96 %  01/07/16 1130 - - - 67 10 95 %  01/07/16 1111 (!) 85/48 - - 76 12 96 %  01/07/16 1100 - - - 75 12 99 %  01/07/16 1056 (!) 95/58 - - 75 11 97 %  01/07/16  1041 (!) 80/52 - - 64 12 97 %  01/07/16 1030 - - - 94 14 95 %  01/07/16 1026 (!) 91/52 - - - 13 -  01/07/16 1011 (!) 85/50 - - 66 14 100 %  01/07/16 1000 - - - 79 13 99 %  01/07/16 0956 (!) 90/51 - - 71 12 98 %  01/07/16 0944 (!) 89/48 - - - 17 -  01/07/16 0941 (!) 79/34 - - 92 15 95 %  01/07/16 0939 - 98.7 F (37.1 C) - - 12 -  01/07/16 0718 139/60 98.5 F (36.9 C) Oral 83 20 99 %  .  Recent laboratory studies: No results found.  Discharge Medications:     Medication List    TAKE these medications   acetaminophen 325 MG tablet Commonly known as:  TYLENOL Take 2 tablets (650 mg total) by mouth every 4 (four) hours as needed. What changed:  how much to take  when to take this  reasons to take this   aspirin EC 81 MG tablet Take 81 mg by mouth every evening.   BIOTIN 5000 PO Take 5,000 mg by mouth at bedtime.   collagenase ointment Commonly known as:  SANTYL Apply 1 application topically  daily. Applied to sore/wound   diltiazem 180 MG 24 hr capsule Commonly known as:  CARDIZEM CD Take 1 capsule (180 mg total) by mouth daily. What changed:  when to take this   doxycycline 100 MG capsule Commonly known as:  VIBRAMYCIN Take 1 capsule (100 mg total) by mouth 2 (two) times daily.   DULoxetine 30 MG capsule Commonly known as:  CYMBALTA Take along with 60 mg. Total 90 mg   DULoxetine 60 MG capsule Commonly known as:  CYMBALTA Take 60 mg by mouth daily. Take along with 30mg  capsule to make a total of 90mg    furosemide 40 MG tablet Commonly known as:  LASIX Take 2 tablets (80 mg total) by mouth daily.   gabapentin 600 MG tablet Commonly known as:  NEURONTIN Take 600-1,200 mg by mouth 2 (two) times daily. 1 tablet in morning and 2 tablets at night   halobetasol 0.05 % ointment Commonly known as:  ULTRAVATE Apply 1 application topically 2 (two) times daily. For psorasis   HUMIRA PEN 40 MG/0.8ML Pnkt Generic drug:  Adalimumab Inject 40 mg as directed every  14 (fourteen) days. On Tuesdays.   HYDROcodone-acetaminophen 5-325 MG tablet Commonly known as:  NORCO/VICODIN Take 1-2 tablets by mouth every 6 (six) hours as needed (for pain (scheduled 1 tablet at bedtime)).   losartan 100 MG tablet Commonly known as:  COZAAR Take 100 mg by mouth daily.   Magnesium 250 MG Tabs Take 1 tablet by mouth every evening.   metoprolol succinate 100 MG 24 hr tablet Commonly known as:  TOPROL-XL TAKE 1 TABLET BY MOUTH  DAILY WITH OR IMMEDIATLEY  FOLLOWING A MEAL   multivitamin with minerals Tabs tablet Take 1 tablet by mouth daily.   omeprazole 20 MG capsule Commonly known as:  PRILOSEC Take 20 mg by mouth every evening.   OTEZLA 30 MG Tabs Generic drug:  Apremilast Take 30 mg by mouth 2 (two) times daily.   potassium chloride SA 20 MEQ tablet Commonly known as:  K-DUR,KLOR-CON Take 20 mEq by mouth 2 (two) times daily.   predniSONE 5 MG tablet Commonly known as:  DELTASONE Take 5 mg by mouth daily with breakfast.   predniSONE 5 MG tablet Commonly known as:  DELTASONE Take 5 mg by mouth daily.   PROAIR HFA 108 (90 Base) MCG/ACT inhaler Generic drug:  albuterol Inhale 1-2 puffs into the lungs every 6 (six) hours as needed for wheezing or shortness of breath.   Vitamin D (Ergocalciferol) 50000 units Caps capsule Commonly known as:  DRISDOL Take 50,000 Units by mouth every Tuesday.       Diagnostic Studies: No results found.  Patient benefited maximally from their hospital stay and there were no complications.     Disposition: 01-Home or Self Care Discharge Instructions    Call MD / Call 911    Complete by:  As directed    If you experience chest pain or shortness of breath, CALL 911 and be transported to the hospital emergency room.  If you develope a fever above 101 F, pus (white drainage) or increased drainage or redness at the wound, or calf pain, call your surgeon's office.   Constipation Prevention    Complete by:  As  directed    Drink plenty of fluids.  Prune juice may be helpful.  You may use a stool softener, such as Colace (over the counter) 100 mg twice a day.  Use MiraLax (over the counter) for constipation as needed.  Diet - low sodium heart healthy    Complete by:  As directed    Elevate operative extremity    Complete by:  As directed    Increase activity slowly as tolerated    Complete by:  As directed    Touch down weight bearing    Complete by:  As directed    Laterality:  right   Extremity:  Lower     Follow-up Information    Nadara Mustard, MD Follow up in 1 week(s).   Specialty:  Orthopedic Surgery Contact information: [address] [place] [address] [phone]            Signed: Nadara Mustard 01/08/2016, 7:01 AM

## 2016-01-08 NOTE — Clinical Social Work Placement (Signed)
   CLINICAL SOCIAL WORK PLACEMENT  NOTE  Date:  01/08/2016  Patient Details  Name: Andrea Yu MRN: [id number] Date of Birth: [birth year]  Clinical Social Work is seeking post-discharge placement for this patient at the Skilled  Nursing Facility level of care (*CSW will initial, date and re-position this form in  chart as items are completed):      Patient/family provided with [facility] Clinical Social Work Department's list of facilities offering this level of care within the geographic area requested by the patient (or if unable, by the patient's family).  Yes   Patient/family informed of their freedom to choose among providers that offer the needed level of care, that participate in Medicare, Medicaid or managed care program needed by the patient, have an available bed and are willing to accept the patient.      Patient/family informed of [facility]'s ownership interest in [facility] and [facility] as well as of the fact that they are under no obligation to receive care at these facilities.  PASRR submitted to EDS on       PASRR number received on 01/08/16     Existing PASRR number confirmed on       FL2 transmitted to all facilities in geographic area requested by pt/family on 01/08/16     FL2 transmitted to all facilities within larger geographic area on       Patient informed that his/her managed care company has contracts with or will negotiate with certain facilities, including the following:        Yes   Patient/family informed of bed offers received.  Patient chooses bed at [facility] [place]     Physician recommends and patient chooses bed at      Patient to be transferred to [facility] [place] on 01/09/16.  Patient to be transferred to facility by [facility]     Patient family notified on 01/09/16 of transfer.  Name of family member notified:  Patient     PHYSICIAN Please prepare priority discharge summary, including medications, Please  prepare prescriptions     Additional Comment:    _______________________________________________ Volney American, LCSW 01/08/2016, 3:14 PM

## 2016-01-08 NOTE — Progress Notes (Signed)
Patient ID: Andrea Yu, female   DOB: [birth year], 75 y.o.   MRN: [id number] Patient without complaints this morning. She states she's ready for discharge to home. Patient will still need the cam boot for discharge patient states she has Vicodin at home and does not need pain medication prescription. Follow-up the office next week to change the dressing.

## 2016-01-08 NOTE — Progress Notes (Signed)
MD Lajoyce Corners Assistant Judeth Cornfield called RN Juletta Berhe back after speaking with MD. MD okay with patient now going to SNF for rehab, and MD will update Discharge AVS

## 2016-01-08 NOTE — Telephone Encounter (Signed)
Nurse called back stating that she needs to go to skilled facility.

## 2016-01-08 NOTE — Progress Notes (Signed)
Physical Therapy Treatment Patient Details Name: Andrea Yu MRN: [id number] DOB: 02-15-1940 Today's Date: 01/08/2016    History of Present Illness Pt presents for right ankle fusion after failing conservative management of arthritis. PMH: SAH, a-fib, HTN, anemia, psoriatic arthritis, L TKA    PT Comments    Patient required max A +2 to perform squat pivot bed to recliner this session and demonstrated a decreased awareness of safety beginning of session. Pt reported multiple falls a month. Pt is unable to maintain WB status. Given pt's current mobility level, recommending ST-SNF for further skilled PT services to maximize independence and safety with mobility. Pt in agreement end of session.   Follow Up Recommendations  SNF;Supervision/Assistance - 24 hour     Equipment Recommendations  Other (comment) (TBD next venue)    Recommendations for Other Services       Precautions / Restrictions Precautions Precautions: Fall Precaution Comments: pt reported several falls a month and that husband is unable to get her off of the floor Restrictions Weight Bearing Restrictions: Yes RLE Weight Bearing: Touchdown weight bearing    Mobility  Bed Mobility               General bed mobility comments: pt sitting EOB upon arrival  Transfers Overall transfer level: Needs assistance Equipment used: Rolling walker (2 wheeled);None Transfers: Squat Pivot Transfers     Squat pivot transfers: Max assist;+2 physical assistance     General transfer comment: attempted sit to stand X 2 but pt unable to bring hips off of EOB or maintain TDWB on R LE; squat pivot to recliner with max A +2 and max cues for technique and WB status; pt required assistance to maintain WB status  Ambulation/Gait                 Stairs            Wheelchair Mobility    Modified Rankin (Stroke Patients Only)       Balance Overall balance assessment: Needs assistance;History of  Falls Sitting-balance support: No upper extremity supported;Feet supported Sitting balance-Leahy Scale: Fair Sitting balance - Comments: maintains balance during scooting, tends to scoot too close to EOB and has more difficulty scooting bkwds   Standing balance support: Bilateral upper extremity supported Standing balance-Leahy Scale: Zero                      Cognition Arousal/Alertness: Awake/alert Behavior During Therapy: [facility] for tasks assessed/performed;Impulsive Overall Cognitive Status: Within Functional Limits for tasks assessed Area of Impairment: Safety/judgement         Safety/Judgement: Decreased awareness of safety;Decreased awareness of deficits     General Comments: pt with decreased awareness of safety and reported that she will "just call EMS" when she falls    Exercises      General Comments General comments (skin integrity, edema, etc.): RN present       Pertinent Vitals/Pain Pain Assessment: Faces Faces Pain Scale: Hurts a little bit Pain Location: right ankle Pain Descriptors / Indicators: Sore Pain Intervention(s): Limited activity within patient's tolerance;Monitored during session;Premedicated before session;Repositioned    Home Living                      Prior Function            PT Goals (current goals can now be found in the care plan section) Acute Rehab PT Goals Patient Stated Goal: return home PT Goal Formulation: With  patient Time For Goal Achievement: 01/21/16 Potential to Achieve Goals: Good Progress towards PT goals: Not progressing toward goals - comment    Frequency    Min 5X/week      PT Plan Discharge plan needs to be updated    Co-evaluation             End of Session Equipment Utilized During Treatment: Gait belt Activity Tolerance: Patient tolerated treatment well Patient left: with call bell/phone within reach;in chair     Time: 9937-1696 PT Time Calculation (min) (ACUTE ONLY): 27  min  Charges:  $Therapeutic Activity: 23-37 mins                    G Codes:      Derek Mound, PTA Pager: [phone]   01/08/2016, 10:37 AM

## 2016-01-08 NOTE — Care Management Obs Status (Signed)
MEDICARE OBSERVATION STATUS NOTIFICATION   Patient Details  Name: Andrea Yu MRN: [id number] Date of Birth: 03/29/1940   Medicare Observation Status Notification Given:  Yes    [place], [place], RN 01/08/2016, 10:46 AM

## 2016-01-08 NOTE — Progress Notes (Signed)
Orthopedic Tech Progress Note Patient Details:  Andrea Yu [birth year] [id number]  Ortho Devices Ortho Device/Splint Location: Pt stated to have a post op shoe/boot at home and our products is not needed.    Alvina Chou 01/08/2016, 9:10 PM

## 2016-01-08 NOTE — Progress Notes (Signed)
Rn called back to office to get in touch with MD (third attempt) . Discharge AVS needs to be modified, and also RN just wanted to alert MD what SNF.

## 2016-01-08 NOTE — Clinical Social Work Note (Signed)
Clinical Social Work Assessment  Patient Details  Name: Andrea Yu MRN: [id number] Date of Birth: September 03, 1940  Date of referral:  01/08/16               Reason for consult:  Facility Placement                Permission sought to share information with:  Family Supports Permission granted to share information::     Name::     Richard  Agency::     Relationship::  Spouse  Contact Information:  [phone]  Housing/Transportation Living arrangements for the past 2 months:  Single Family Home Source of Information:  Patient Patient Interpreter Needed:  None Criminal Activity/Legal Involvement Pertinent to Current Situation/Hospitalization:  No - Comment as needed Significant Relationships:  Spouse Lives with:  Spouse Do you feel safe going back to the place where you live?  Yes Need for family participation in patient care:  Yes (Comment)  Care giving concerns:  No family or friends at bedside at this time.   Social Worker assessment / plan:  Pt's plan was to go home however, pt unable to move much today and not stable enough to d/c home. CSW spoke with pt at bedside. Pt lives at home with spouse. Pt is agreeable to SNF placement at this time. Pt prefers [facility] and will go to [facility] as backup. CSW will reach out to facility.  Employment status:  Retired Database administrator PT Recommendations:  Skilled Nursing Facility Information / Referral to community resources:  Skilled Nursing Facility  Patient/Family's Response to care:  Pt verbalized understanding of CSW role and appreciation of support. Pt denies any questions regarding care at this time.  Patient/Family's Understanding of and Emotional Response to Diagnosis, Current Treatment, and Prognosis:  Pt understanding and realistic regarding physical limitations. Pt understanding need for SNF placement. Pt agreeable to SNF placement at this time. Pt denies any questions regarding  treatment plan at this time.  Emotional Assessment Appearance:  Appears stated age Attitude/Demeanor/Rapport:   (Patient is appropriate.) Affect (typically observed):  Accepting, Appropriate Orientation:  Oriented to Self, Oriented to Place, Oriented to  Time, Oriented to Situation Alcohol / Substance use:  Not Applicable Psych involvement (Current and /or in the community):  No (Comment)  Discharge Needs  Concerns to be addressed:  No discharge needs identified Readmission within the last 30 days:  No Current discharge risk:  Dependent with Mobility Barriers to Discharge:  Continued Medical Work up   [employer], LCSW 01/08/2016, 10:45 AM

## 2016-01-08 NOTE — Telephone Encounter (Signed)
Cierra from 5N #[id number] @[facility]  calling re patient being discharged to go home. Please call.  Patient is not getting out of bed.  states she will need skilled instead of going home.

## 2016-01-08 NOTE — Care Management Note (Signed)
Case Management Note  Patient Details  Name: ARGELIA FORMISANO MRN: [id number] Date of Birth: 1940/04/16  Subjective/Objective:  Pt presented for Subacute osteomyelitis, right ankle and foot- s/p ankle fusion. Pt is from home- plan will be for SNF once stable for d/c.                  Action/Plan: CSW assisting with disposition needs to [facility]. No further needs from CM at this time.   Expected Discharge Date:                  Expected Discharge Plan:  Skilled Nursing Facility  In-House Referral:  Clinical Social Work  Discharge planning Services  CM Consult  Post Acute Care Choice:  NA Choice offered to:  NA  DME Arranged:  N/A DME Agency:  NA  HH Arranged:  NA HH Agency:  NA  Status of Service:  Completed, signed off  If discussed at Long Length of Stay Meetings, dates discussed:    Additional Comments:  Jeanita, Carne[place], RN 01/08/2016, 10:44 AM

## 2016-01-08 NOTE — Clinical Social Work Note (Signed)
Pt has a bed at [facility] today. Please update disposition on d/c summary and provide any pain med prescriptions signed on pt's chart. Thanks  [place], [place] [phone]

## 2016-01-08 NOTE — [place] FL2 (Signed)
[place] MEDICAID FL2 LEVEL OF CARE SCREENING TOOL     IDENTIFICATION  Patient Name: Andrea Yu Birthdate: Apr 20, 1940 Sex: female Admission Date (Current Location): 01/07/2016  [facility] and [place] Number:  Producer, television/film/video and Address:  [facility], [address], [place], [place] [address]      Provider Number: [id number]  Attending Physician Name and Address:  Nadara Mustard, MD  Relative Name and Phone Number:       Current Level of Care: Hospital Recommended Level of Care: Skilled Nursing Facility Prior Approval Number:    Date Approved/Denied: 01/08/16 PASRR Number: [id number]  Discharge Plan: SNF    Current Diagnoses: Patient Active Problem List   Diagnosis Date Noted  . S/P ankle fusion 01/07/2016  . Subacute osteomyelitis, right ankle and foot (HCC)   . SAH (subarachnoid hemorrhage) (HCC) 09/14/2014  . A-fib (HCC) 09/14/2014  . Hypertension 09/14/2014  . Laceration of eyebrow, left 09/14/2014  . Fall 09/14/2014  . Gastritis, acute 11/11/2012  . Cellulitis of leg, right 11/11/2012  . Obesity (BMI 30-39.9)- suspected sleep apnea, declines sleep study 11/10/2012  . Psoriatic arthritis (HCC) 11/06/2012  . Iron deficiency anemia- transfused this admission 11/06/2012  . Depression 11/06/2012  . Peripheral neuropathy (HCC) 11/06/2012  . Shock circulatory on admission 11/06/12 11/06/2012  . Systemic inflammatory response syndrome (SIRS) (HCC) 11/06/2012  . Nausea and vomiting 08/09/2012  . Adrenal insufficiency (HCC) 08/09/2012  . PAF (paroxysmal atrial fibrillation) (HCC) 08/09/2012  . Hypotension 08/09/2012  . Dehydration 08/09/2012    Orientation RESPIRATION BLADDER Height & Weight     Self, Time, Situation, Place  Normal Continent Weight:   Height:     BEHAVIORAL SYMPTOMS/MOOD NEUROLOGICAL BOWEL NUTRITION STATUS      Continent  (Please see discharge summary)  AMBULATORY STATUS COMMUNICATION OF NEEDS Skin    Extensive Assist Verbally Surgical wounds (Closed incision right ankle)                       Personal Care Assistance Level of Assistance  Bathing, Feeding, Dressing Bathing Assistance: Maximum assistance Feeding assistance: Limited assistance Dressing Assistance: Maximum assistance     Functional Limitations Info  Sight, Hearing, Speech Sight Info: Adequate Hearing Info: Adequate Speech Info: Adequate    SPECIAL CARE FACTORS FREQUENCY  PT (By licensed PT), OT (By licensed OT)     PT Frequency: min 5x week OT Frequency: min 5x week            Contractures Contractures Info: Not present    Additional Factors Info  Code Status, Allergies Code Status Info: Full Allergies Info: Penicillins, Prolia Denosumab, Fosamax Alendronate Sodium           Current Medications (01/08/2016):  This is the current hospital active medication list Current Facility-Administered Medications  Medication Dose Route Frequency Provider Last Rate Last Dose  . 0.9 %  sodium chloride infusion   Intravenous Continuous Nadara Mustard, MD      . acetaminophen (TYLENOL) tablet 650 mg  650 mg Oral Q6H PRN Nadara Mustard, MD   650 mg at 01/08/16 0744   Or  . acetaminophen (TYLENOL) suppository 650 mg  650 mg Rectal Q6H PRN Nadara Mustard, MD      . albuterol (PROVENTIL) (2.5 MG/3ML) 0.083% nebulizer solution 2.5 mg  2.5 mg Inhalation Q6H PRN Nadara Mustard, MD      . Apremilast TABS 30 mg  30 mg Oral Q12H  Nadara Mustard, MD   30 mg at 01/08/16 [phone]  . aspirin EC tablet 81 mg  81 mg Oral QPM Nadara Mustard, MD   81 mg at 01/07/16 1850  . diltiazem (CARDIZEM CD) 24 hr capsule 180 mg  180 mg Oral QPM Nadara Mustard, MD   180 mg at 01/07/16 1850  . DULoxetine (CYMBALTA) DR capsule 90 mg  90 mg Oral Daily Nadara Mustard, MD   90 mg at 01/08/16 0941  . furosemide (LASIX) tablet 80 mg  80 mg Oral Daily Nadara Mustard, MD   80 mg at 01/08/16 0942  . gabapentin (NEURONTIN) tablet 1,200 mg  1,200 mg Oral QHS  Nadara Mustard, MD   1,200 mg at 01/07/16 2127  . gabapentin (NEURONTIN) tablet 600 mg  600 mg Oral Daily Nadara Mustard, MD   600 mg at 01/08/16 0943  . HYDROmorphone (DILAUDID) injection 1 mg  1 mg Intravenous Q2H PRN Nadara Mustard, MD      . losartan (COZAAR) tablet 100 mg  100 mg Oral Daily Nadara Mustard, MD   100 mg at 01/08/16 0941  . methocarbamol (ROBAXIN) tablet 500 mg  500 mg Oral Q6H PRN Nadara Mustard, MD       Or  . methocarbamol (ROBAXIN) 500 mg in dextrose 5 % 50 mL IVPB  500 mg Intravenous Q6H PRN Nadara Mustard, MD      . metoCLOPramide (REGLAN) tablet 5-10 mg  5-10 mg Oral Q8H PRN Nadara Mustard, MD       Or  . metoCLOPramide (REGLAN) injection 5-10 mg  5-10 mg Intravenous Q8H PRN Nadara Mustard, MD      . metoprolol succinate (TOPROL-XL) 24 hr tablet 100 mg  100 mg Oral Daily Nadara Mustard, MD   100 mg at 01/08/16 0941  . ondansetron (ZOFRAN) tablet 4 mg  4 mg Oral Q6H PRN Nadara Mustard, MD       Or  . ondansetron [facility]) injection 4 mg  4 mg Intravenous Q6H PRN Nadara Mustard, MD      . oxyCODONE (Oxy IR/ROXICODONE) immediate release tablet 5-10 mg  5-10 mg Oral Q3H PRN Nadara Mustard, MD      . pantoprazole (PROTONIX) EC tablet 40 mg  40 mg Oral Daily Nadara Mustard, MD   40 mg at 01/08/16 0943  . potassium chloride SA (K-DUR,KLOR-CON) CR tablet 20 mEq  20 mEq Oral BID Nadara Mustard, MD   20 mEq at 01/08/16 0942  . predniSONE (DELTASONE) tablet 5 mg  5 mg Oral Q breakfast Nadara Mustard, MD   5 mg at 01/08/16 0745     Discharge Medications: Please see discharge summary for a list of discharge medications.  Relevant Imaging Results:  Relevant Lab Results:   Additional Information SSN: [id number]  [employer], LCSW

## 2016-01-08 NOTE — Progress Notes (Addendum)
RN paged MD by calling Ortho office, spoke with representative who will send message to Md. RN walked in room and PT was in room attempting to get patient up, patient unable to help, or stand up with "fgood left foot" patient stated, "I want to go home, when the doctor asked that's what I said and if I fall I can just call EMS to help me get off the floor." RN and PT explained that it would be better to discharge to Rehab to help her get her strength back. Rn will await to hear from Md, but will let SW know

## 2016-01-09 DIAGNOSIS — M86271 Subacute osteomyelitis, right ankle and foot: Secondary | ICD-10-CM | POA: Diagnosis not present

## 2016-01-09 MED ORDER — HYDROCODONE-ACETAMINOPHEN 5-325 MG PO TABS: 1.0000 | ORAL_TABLET | ORAL | 0 refills | Status: DC | PRN

## 2016-01-09 NOTE — Discharge Summary (Signed)
Discharge Diagnoses:  Active Problems:   S/P ankle fusion   Subacute osteomyelitis, right ankle and foot (HCC)   Surgeries: Procedure(s): Right Tibiocalcaneal Fusion on 01/07/2016    Consultants:  none  Discharged Condition: Improved  Hospital Course: [name] is an 75 y.o. female who was admitted 01/07/2016 with a chief complaint of right distal fibula osteomyelitis with ulceration and varus collapse of the ankle, with a final diagnosis of Osteomyelitis Right Fibula.  Patient was brought to the operating room on 01/07/2016 and underwent Procedure(s): Right Tibiocalcaneal Fusion.    Patient was given perioperative antibiotics: Anti-infectives    Start     Dose/Rate Route Frequency Ordered Stop   01/07/16 1430  clindamycin (CLEOCIN) IVPB 600 mg     600 mg 100 mL/hr over 30 Minutes Intravenous Every 6 hours 01/07/16 1248 01/07/16 2328   01/07/16 1230  clindamycin (CLEOCIN) IVPB 600 mg  Status:  Discontinued     600 mg 100 mL/hr over 30 Minutes Intravenous Every 6 hours 01/07/16 1224 01/07/16 1248   01/07/16 0800  clindamycin (CLEOCIN) IVPB 900 mg     900 mg 100 mL/hr over 30 Minutes Intravenous To ShortStay Surgical 01/06/16 1143 01/07/16 0830    .  Patient was given sequential compression devices, early ambulation, and aspirin for DVT prophylaxis.  Recent vital signs: Patient Vitals for the past 24 hrs:  BP Temp Temp src Pulse Resp SpO2  01/09/16 0603 (!) 93/47 100.1 F (37.8 C) Oral (!) 106 16 93 %  01/09/16 0054 - - - 88 - -  01/08/16 2030 (!) 96/50 (!) 100.5 F (38.1 C) Oral (!) 115 18 94 %  01/08/16 1100 (!) 143/55 98.5 F (36.9 C) Oral 97 18 97 %  .  Recent laboratory studies: No results found.  Discharge Medications:   Allergies as of 01/09/2016      Reactions   Penicillins Anaphylaxis   THROAT SWELLING Has patient had a PCN reaction causing immediate rash, facial/tongue/throat swelling, SOB or lightheadedness with hypotension:  # # YES # #  Has patient  had a PCN reaction causing severe rash involving mucus membranes or skin necrosis: # # # YES # # # Has patient had a PCN reaction that required hospitalization:No Has patient had a PCN reaction occurring within the last 10 years:  # # YES # #  If all of the above answers are "NO", then may proceed with Cephalosporin use.   Prolia [denosumab] Other (See Comments)   Severe Pain in the groin area.   Fosamax [alendronate Sodium]    UNSPECIFIED REACTION       Medication List    TAKE these medications   acetaminophen 325 MG tablet Commonly known as:  TYLENOL Take 2 tablets (650 mg total) by mouth every 4 (four) hours as needed. What changed:  how much to take  when to take this  reasons to take this   aspirin EC 81 MG tablet Take 81 mg by mouth every evening.   BIOTIN 5000 PO Take 5,000 mg by mouth at bedtime.   collagenase ointment Commonly known as:  SANTYL Apply 1 application topically daily. Applied to sore/wound   diltiazem 180 MG 24 hr capsule Commonly known as:  CARDIZEM CD Take 1 capsule (180 mg total) by mouth daily. What changed:  when to take this   doxycycline 100 MG capsule Commonly known as:  VIBRAMYCIN Take 1 capsule (100 mg total) by mouth 2 (two) times daily.   DULoxetine 30 MG  capsule Commonly known as:  CYMBALTA Take along with 60 mg. Total 90 mg   DULoxetine 60 MG capsule Commonly known as:  CYMBALTA Take 60 mg by mouth daily. Take along with 30mg  capsule to make a total of 90mg    furosemide 40 MG tablet Commonly known as:  LASIX Take 2 tablets (80 mg total) by mouth daily.   gabapentin 600 MG tablet Commonly known as:  NEURONTIN Take 600-1,200 mg by mouth 2 (two) times daily. 1 tablet in morning and 2 tablets at night   halobetasol 0.05 % ointment Commonly known as:  ULTRAVATE Apply 1 application topically 2 (two) times daily. For psorasis   HUMIRA PEN 40 MG/0.8ML Pnkt Generic drug:  Adalimumab Inject 40 mg as directed every 14  (fourteen) days. On Tuesdays.   HYDROcodone-acetaminophen 5-325 MG tablet Commonly known as:  NORCO/VICODIN Take 1 tablet by mouth every 4 (four) hours as needed for moderate pain. What changed:  You were already taking a medication with the same name, and this prescription was added. Make sure you understand how and when to take each.   HYDROcodone-acetaminophen 5-325 MG tablet Commonly known as:  NORCO/VICODIN Take 1-2 tablets by mouth every 6 (six) hours as needed (for pain (scheduled 1 tablet at bedtime)). What changed:  Another medication with the same name was added. Make sure you understand how and when to take each.   losartan 100 MG tablet Commonly known as:  COZAAR Take 100 mg by mouth daily.   Magnesium 250 MG Tabs Take 1 tablet by mouth every evening.   metoprolol succinate 100 MG 24 hr tablet Commonly known as:  TOPROL-XL TAKE 1 TABLET BY MOUTH  DAILY WITH OR IMMEDIATLEY  FOLLOWING A MEAL   multivitamin with minerals Tabs tablet Take 1 tablet by mouth daily.   omeprazole 20 MG capsule Commonly known as:  PRILOSEC Take 20 mg by mouth every evening.   OTEZLA 30 MG Tabs Generic drug:  Apremilast Take 30 mg by mouth 2 (two) times daily.   potassium chloride SA 20 MEQ tablet Commonly known as:  K-DUR,KLOR-CON Take 20 mEq by mouth 2 (two) times daily.   predniSONE 5 MG tablet Commonly known as:  DELTASONE Take 5 mg by mouth daily with breakfast.   predniSONE 5 MG tablet Commonly known as:  DELTASONE Take 5 mg by mouth daily.   PROAIR HFA 108 (90 Base) MCG/ACT inhaler Generic drug:  albuterol Inhale 1-2 puffs into the lungs every 6 (six) hours as needed for wheezing or shortness of breath.   Vitamin D (Ergocalciferol) 50000 units Caps capsule Commonly known as:  DRISDOL Take 50,000 Units by mouth every Tuesday.       Diagnostic Studies: No results found.  Patient benefited maximally from their hospital stay and there were no complications.      Disposition: 01-Home or Self Care Discharge Instructions    Call MD / Call 911    Complete by:  As directed    If you experience chest pain or shortness of breath, CALL 911 and be transported to the hospital emergency room.  If you develope a fever above 101 F, pus (white drainage) or increased drainage or redness at the wound, or calf pain, call your surgeon's office.   Call MD / Call 911    Complete by:  As directed    If you experience chest pain or shortness of breath, CALL 911 and be transported to the hospital emergency room.  If you develope a fever  above 101 F, pus (white drainage) or increased drainage or redness at the wound, or calf pain, call your surgeon's office.   Constipation Prevention    Complete by:  As directed    Drink plenty of fluids.  Prune juice may be helpful.  You may use a stool softener, such as Colace (over the counter) 100 mg twice a day.  Use MiraLax (over the counter) for constipation as needed.   Constipation Prevention    Complete by:  As directed    Drink plenty of fluids.  Prune juice may be helpful.  You may use a stool softener, such as Colace (over the counter) 100 mg twice a day.  Use MiraLax (over the counter) for constipation as needed.   Diet - low sodium heart healthy    Complete by:  As directed    Diet - low sodium heart healthy    Complete by:  As directed    Elevate operative extremity    Complete by:  As directed    Increase activity slowly as tolerated    Complete by:  As directed    Increase activity slowly as tolerated    Complete by:  As directed    Touch down weight bearing    Complete by:  As directed    Laterality:  right   Extremity:  Lower     Follow-up Information    Nadara MustardMarcus V Luqman Perrelli, MD Follow up in 1 week(s).   Specialty:  Orthopedic Surgery Contact information: [address] [place] [address] [phone]            Signed: Nadara MustardMarcus V Jaheem Hedgepath 01/09/2016, 6:36 AM

## 2016-01-09 NOTE — Clinical Social Work Note (Signed)
Clinical Social Worker facilitated patient discharge including contacting patient family and facility to confirm patient discharge plans.  Clinical information faxed to facility and family agreeable with plan.  CSW arranged ambulance transport via [facility] to [facility].  RN to call [phone]  Room 603 for report prior to discharge.  Clinical Social Worker will sign off for now as social work intervention is no longer needed. Please consult [place] again if new need arises.  [address], [place] [phone]

## 2016-01-09 NOTE — Progress Notes (Signed)
Patient ID: Andrea Yu, female   DOB: [birth year], 75 y.o.   MRN: [id number] Patient had difficulty progressing with physical therapy and was felt to benefit from short-term skilled nursing placement. Plan for discharge to skilled nursing today.

## 2016-01-15 ENCOUNTER — Ambulatory Visit (INDEPENDENT_AMBULATORY_CARE_PROVIDER_SITE_OTHER): Payer: Medicare Other | Admitting: Orthopedic Surgery

## 2016-01-15 VITALS — Ht 59.0 in | Wt 197.0 lb

## 2016-01-15 DIAGNOSIS — Z981 Arthrodesis status: Secondary | ICD-10-CM

## 2016-01-15 NOTE — Progress Notes (Signed)
Office Visit Note   Patient: Andrea Yu           Date of Birth: 1940-03-11           MRN: [id number] Visit Date: 01/15/2016              Requested by: Jarome Matin, MD [address] [place], [place] [address] PCP: Garlan Fillers, MD   Assessment & Plan: Visit Diagnoses:  1. Status post ankle arthrodesis     Plan: Patient to continue with therapy at skilled nursing. She states she is only getting 15 minutes of therapy a day. She is to use her CAM boot walker touch down weight weightbearing on the right with dry dressing change daily orders were written.  Follow-Up Instructions: Return in about 2 weeks (around 01/29/2016).   Orders:  No orders of the defined types were placed in this encounter.  No orders of the defined types were placed in this encounter.     Procedures: No procedures performed   Clinical Data: No additional findings.   Subjective: Chief Complaint  Patient presents with  . Right Ankle - Routine Post Op    01/07/16 Right Tibiocalcaneal Fusion with excision of the distal fibula with local tissue rearrangement for wound closure     1 week and 1 day s/p Right Tibiocalcaneal Fusion with excision of the distal fibula with local tissue rearrangement for wound closure. She is non weight bearing and in a wheelchair. Stitches are intact the post operative dressing has been removed and the distal end of the incision is slightly macerated. Old bloody drainage on the dressing. Pt does not have any complaints of pain. She is wanting to know if she can use just her toes for balance with weight bearing. Otherwise no concersn.     Review of Systems   Objective: Vital Signs: Ht 4\' 11"  (1.499 m)   Wt 197 lb (89.4 kg)   BMI 39.79 kg/m   Physical Exam examination the wound edges are well approximated there is a small amount of serosanguineous drainage there is no cellulitis no odor no signs of infection. Patient's foot is plantar grade she is pleased with  the alignment there is minimal swelling.  Ortho Exam  Specialty Comments:  No specialty comments available.  Imaging: No results found.   PMFS History: Patient Active Problem List   Diagnosis Date Noted  . 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 Subacute osteomyelitis, right ankle and foot (HCC)   . SAH (subarachnoid hemorrhage) (HCC) 09/14/2014  . A-fib (HCC) 09/14/2014  . Hypertension 09/14/2014  . Laceration of eyebrow, left 09/14/2014  . Fall 09/14/2014  . Gastritis, acute 11/11/2012  . Cellulitis of leg, right 11/11/2012  . Obesity (BMI 30-39.9)- suspected sleep apnea, declines sleep study 11/10/2012  . Psoriatic arthritis (HCC) 11/06/2012  . Iron deficiency anemia- transfused this admission 11/06/2012  . Depression 11/06/2012  . Peripheral neuropathy (HCC) 11/06/2012  . Shock circulatory on admission 11/06/12 11/06/2012  . Systemic inflammatory response syndrome (SIRS) (HCC) 11/06/2012  . Nausea and vomiting 08/09/2012  . Adrenal insufficiency (HCC) 08/09/2012  . PAF (paroxysmal atrial fibrillation) (HCC) 08/09/2012  . Hypotension 08/09/2012  . Dehydration 08/09/2012   Past Medical History:  Diagnosis Date  . A-fib (HCC)    "just after knee 2nd OR" (11/07/2012)  . Anemia   . Arthritis    RA , SACROTIC ARTHRITIS  . Complication of anesthesia    found to be in afib when she woke up  .  Dysrhythmia   . GERD (gastroesophageal reflux disease)   . Head injury, closed, with concussion   . History of blood transfusion    "after 1 of my knee ORs and today" (11/07/2012)  . Hypertension   . Peripheral neuropathy (HCC)    "from back OR in 2005; in hands and feet" (11/07/2012)  . Psoriatic arthritis (HCC)    on Prednisone Rx    Family History  Problem Relation Age of Onset  . Heart attack Father     1951-deceased   . Liver disease Mother   . Aneurysm Son 8    Deceased     Past Surgical History:  Procedure Laterality Date  . ANKLE FUSION Right 01/07/2016     Procedure: Right Tibiocalcaneal Fusion;  Surgeon: Nadara Mustard, MD;  Location: [facility] OR;  Service: Orthopedics;  Laterality: Right;  . BACK SURGERY    . CATARACT EXTRACTION W/ INTRAOCULAR LENS  IMPLANT, BILATERAL Bilateral 2007  . CHOLECYSTECTOMY  2000's  . COLONOSCOPY N/A 11/10/2012   Procedure: COLONOSCOPY;  Surgeon: Theda Belfast, MD;  Location[facility] ENDOSCOPY;  Service: Endoscopy;  Laterality: N/A;  . ESOPHAGOGASTRODUODENOSCOPY N/A 11/10/2012   Procedure: ESOPHAGOGASTRODUODENOSCOPY (EGD);  Surgeon: Theda Belfast, MD;  Location[facility] ENDOSCOPY;  Service: Endoscopy;  Laterality: N/A;  . EYE SURGERY    . JOINT REPLACEMENT    . POSTERIOR LUMBAR FUSION  2005   "got a rod and 4 pins in there" (11/07/2012)  . REPLACEMENT TOTAL KNEE BILATERAL Bilateral 2007  . TRANSTHORACIC ECHOCARDIOGRAM  05/24/2006  . TUBAL LIGATION  1974  . VAGINAL HYSTERECTOMY  1983   Social History   Occupational History  . Not on file.   Social History Main Topics  . Smoking status: Former Smoker    Packs/day: 0.50    Years: 30.00    Types: Cigarettes    Quit date: 08/09/1981  . Smokeless tobacco: Never Used  . Alcohol use No  . Drug use: No  . Sexual activity: Not Currently

## 2016-01-15 NOTE — Progress Notes (Signed)
Late entry for missed G-code. Based on review of the evaluation and goals by Lyanne Co, PT.   Feb 04, 2016 1700  PT G-Codes **NOT FOR INPATIENT CLASS**  Functional Assessment Tool Used clinical judgement based on review of the medical record  Functional Limitation Mobility: Walking and moving around  Mobility: Walking and Moving Around Current Status [phone]) CK  Mobility: Walking and Moving Around Goal Status [phone]) Cristy Folks, PT  [phone] 01/15/2016

## 2016-01-29 ENCOUNTER — Ambulatory Visit (INDEPENDENT_AMBULATORY_CARE_PROVIDER_SITE_OTHER): Payer: Medicare Other | Admitting: Orthopedic Surgery

## 2016-01-29 ENCOUNTER — Encounter (INDEPENDENT_AMBULATORY_CARE_PROVIDER_SITE_OTHER): Payer: Self-pay

## 2016-01-29 ENCOUNTER — Encounter (INDEPENDENT_AMBULATORY_CARE_PROVIDER_SITE_OTHER): Payer: Self-pay | Admitting: Orthopedic Surgery

## 2016-01-29 DIAGNOSIS — M86271 Subacute osteomyelitis, right ankle and foot: Secondary | ICD-10-CM

## 2016-01-29 MED ORDER — DOXYCYCLINE HYCLATE 100 MG PO TABS: 100.0000 mg | ORAL_TABLET | Freq: Two times a day (BID) | ORAL | 0 refills | Status: DC

## 2016-01-29 MED ORDER — MUPIROCIN 2 % EX OINT: 1.0000 "application " | TOPICAL_OINTMENT | Freq: Two times a day (BID) | CUTANEOUS | 3 refills | Status: DC

## 2016-01-29 NOTE — Progress Notes (Signed)
Office Visit Note   Patient: Andrea Yu           Date of Birth: [birth year]           MRN: [id number] Visit Date: 01/29/2016              Requested by: Jarome Matin, MD [address] [place], [place] [address] PCP: Garlan Fillers, MD  Chief Complaint  Patient presents with  . Right Ankle - Routine Post Op    01/07/16 Right Tibiocalcaneal Fusion with excision of the distal fibula with local tissue rearrangement for wound closure     HPI: Patient presents for follow up right tibiocalcaneal fusion with excision of distal fibula w/ local tissue arrangement for wound closure on 01/07/16. She is approximately 3 weeks post op. She is touchdown weightbearing in cam walker. There is very minimal drainage, no odor. There is eschar posterior heel. Sutures are intact. She denies any pain today. She is currently residing at [facility] skilled nursing facility.   Patient has been a full assist for any attempted mobilization. Assessment & Plan: Visit Diagnoses:  1. Subacute osteomyelitis, right ankle and foot (HCC)     Plan: We'll start doxycycline and Bactroban dressing changes we will place her in a PR AFO. She may be weightbearing as tolerated she is to wear the PRA photic 24 hours a day she may be full weightbearing.  Follow-Up Instructions: Return in about 2 weeks (around 02/12/2016).   Ortho Exam Examination incisions are healed quite nicely she does have a small ulcer over the posterior aspect of the heel. This appears to be secondary to well weight-bearing with patient resting her body weight on her heel in her fracture boot.  Imaging: No results found.  Orders:  No orders of the defined types were placed in this encounter.  No orders of the defined types were placed in this encounter.    Procedures: No procedures performed  Clinical Data: No additional findings.  Subjective: Review of Systems  Objective: Vital Signs: There were no vitals taken for this  visit.  Specialty Comments:  No specialty comments available.  PMFS History: Patient Active Problem List   Diagnosis Date Noted  . 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 Subacute osteomyelitis, right ankle and foot (HCC)   . SAH (subarachnoid hemorrhage) (HCC) 09/14/2014  . A-fib (HCC) 09/14/2014  . Hypertension 09/14/2014  . Laceration of eyebrow, left 09/14/2014  . Fall 09/14/2014  . Gastritis, acute 11/11/2012  . Cellulitis of leg, right 11/11/2012  . Obesity (BMI 30-39.9)- suspected sleep apnea, declines sleep study 11/10/2012  . Psoriatic arthritis (HCC) 11/06/2012  . Iron deficiency anemia- transfused this admission 11/06/2012  . Depression 11/06/2012  . Peripheral neuropathy (HCC) 11/06/2012  . Shock circulatory on admission 11/06/12 11/06/2012  . Systemic inflammatory response syndrome (SIRS) (HCC) 11/06/2012  . Nausea and vomiting 08/09/2012  . Adrenal insufficiency (HCC) 08/09/2012  . PAF (paroxysmal atrial fibrillation) (HCC) 08/09/2012  . Hypotension 08/09/2012  . Dehydration 08/09/2012   Past Medical History:  Diagnosis Date  . A-fib (HCC)    "just after knee 2nd OR" (11/07/2012)  . Anemia   . Arthritis    RA , SACROTIC ARTHRITIS  . Complication of anesthesia    found to be in afib when she woke up  . Dysrhythmia   . GERD (gastroesophageal reflux disease)   . Head injury, closed, with concussion   . History of blood transfusion    "after 1 of my knee ORs  and today" (11/07/2012)  . Hypertension   . Peripheral neuropathy (HCC)    "from back OR in 2005; in hands and feet" (11/07/2012)  . Psoriatic arthritis (HCC)    on Prednisone Rx    Family History  Problem Relation Age of Onset  . Heart attack Father     1951-deceased   . Liver disease Mother   . Aneurysm Son 71    Deceased     Past Surgical History:  Procedure Laterality Date  . ANKLE FUSION Right 01/07/2016   Procedure: Right Tibiocalcaneal Fusion;  Surgeon: Nadara Mustard, MD;   Location: [facility] OR;  Service: Orthopedics;  Laterality: Right;  . BACK SURGERY    . CATARACT EXTRACTION W/ INTRAOCULAR LENS  IMPLANT, BILATERAL Bilateral 2007  . CHOLECYSTECTOMY  2000's  . COLONOSCOPY N/A 11/10/2012   Procedure: COLONOSCOPY;  Surgeon: Theda Belfast, MD;  Location: [facility] ENDOSCOPY;  Service: Endoscopy;  Laterality: N/A;  . ESOPHAGOGASTRODUODENOSCOPY N/A 11/10/2012   Procedure: ESOPHAGOGASTRODUODENOSCOPY (EGD);  Surgeon: Theda Belfast, MD;  Location: [facility] ENDOSCOPY;  Service: Endoscopy;  Laterality: N/A;  . EYE SURGERY    . JOINT REPLACEMENT    . POSTERIOR LUMBAR FUSION  2005   "got a rod and 4 pins in there" (11/07/2012)  . REPLACEMENT TOTAL KNEE BILATERAL Bilateral 2007  . TRANSTHORACIC ECHOCARDIOGRAM  05/24/2006  . TUBAL LIGATION  1974  . VAGINAL HYSTERECTOMY  1983   Social History   Occupational History  . Not on file.   Social History Main Topics  . Smoking status: Former Smoker    Packs/day: 0.50    Years: 30.00    Types: Cigarettes    Quit date: 08/09/1981  . Smokeless tobacco: Never Used  . Alcohol use No  . Drug use: No  . Sexual activity: Not Currently

## 2016-02-04 ENCOUNTER — Ambulatory Visit (INDEPENDENT_AMBULATORY_CARE_PROVIDER_SITE_OTHER): Payer: Medicare Other | Admitting: Family

## 2016-02-05 ENCOUNTER — Encounter (INDEPENDENT_AMBULATORY_CARE_PROVIDER_SITE_OTHER): Payer: Self-pay | Admitting: Orthopedic Surgery

## 2016-02-05 ENCOUNTER — Ambulatory Visit (INDEPENDENT_AMBULATORY_CARE_PROVIDER_SITE_OTHER): Payer: Medicare Other | Admitting: Orthopedic Surgery

## 2016-02-05 VITALS — Ht 59.0 in | Wt 197.0 lb

## 2016-02-05 DIAGNOSIS — M86271 Subacute osteomyelitis, right ankle and foot: Secondary | ICD-10-CM

## 2016-02-05 NOTE — Progress Notes (Signed)
Office Visit Note   Patient: Andrea Yu           Date of Birth: 06-15-1940           MRN: [id number] Visit Date: 02/05/2016              Requested by: Jarome Matin, MD [address] [place], [place] [address] PCP: Garlan Fillers, MD  Chief Complaint  Patient presents with  . Right Foot - Wound Check    Right Tibiocalcaneal Fusion 01/07/16 ~1 month post op, with possible exposure of hardware    [id number] is seen for urgent follow-up for exposed hardware with her decubitus ulcer right heel status post tibial calcaneal fusion. HPI  Assessment & Plan: Visit Diagnoses:  1. Subacute osteomyelitis, right ankle and foot (HCC)     Plan: The interlocking screw over the calcaneus was removed without complications. Continue with oral antibiotics doxycycline continue with Dr. Romeo Apple dressing changes twice a day continue with the PRAFO 24 hours a day follow-up in 1 week.  Follow-Up Instructions: Return in about 1 week (around 02/12/2016).   Ortho Exam On examination patient has no ischemic ulcers over the dorsum of the third fourth and fifth toes most likely from pressure from her boot. The decubitus heel ulcers about 1 cm in diameter with black eschar. The screw was visible this was removed without complications in the office after sterile prepping with Betadine a sterile dressing was applied after removal of the screw. There is no ascending cellulitis no odor no drainage.  Imaging: No results found.  Orders:  No orders of the defined types were placed in this encounter.  No orders of the defined types were placed in this encounter.    Procedures: No procedures performed  Clinical Data: No additional findings.  Subjective: Review of Systems  Objective: Vital Signs: Ht 4\' 11"  (1.499 m)   Wt 197 lb (89.4 kg)   BMI 39.79 kg/m   Specialty Comments:  No specialty comments available.  PMFS History: Patient Active Problem List   Diagnosis Date Noted  . Status post  ankle arthrodesis 01/07/2016  . Subacute osteomyelitis, right ankle and foot (HCC)   . SAH (subarachnoid hemorrhage) (HCC) 09/14/2014  . A-fib (HCC) 09/14/2014  . Hypertension 09/14/2014  . Laceration of eyebrow, left 09/14/2014  . Fall 09/14/2014  . Gastritis, acute 11/11/2012  . Cellulitis of leg, right 11/11/2012  . Obesity (BMI 30-39.9)- suspected sleep apnea, declines sleep study 11/10/2012  . Psoriatic arthritis (HCC) 11/06/2012  . Iron deficiency anemia- transfused this admission 11/06/2012  . Depression 11/06/2012  . Peripheral neuropathy (HCC) 11/06/2012  . Shock circulatory on admission 11/06/12 11/06/2012  . Systemic inflammatory response syndrome (SIRS) (HCC) 11/06/2012  . Nausea and vomiting 08/09/2012  . Adrenal insufficiency (HCC) 08/09/2012  . PAF (paroxysmal atrial fibrillation) (HCC) 08/09/2012  . Hypotension 08/09/2012  . Dehydration 08/09/2012   Past Medical History:  Diagnosis Date  . A-fib (HCC)    "just after knee 2nd OR" (11/07/2012)  . Anemia   . Arthritis    RA , SACROTIC ARTHRITIS  . Complication of anesthesia    found to be in afib when she woke up  . Dysrhythmia   . GERD (gastroesophageal reflux disease)   . Head injury, closed, with concussion   . History of blood transfusion    "after 1 of my knee ORs and today" (11/07/2012)  . Hypertension   . Peripheral neuropathy (HCC)    "from back OR in 2005; in  hands and feet" (11/07/2012)  . Psoriatic arthritis (HCC)    on Prednisone Rx    Family History  Problem Relation Age of Onset  . Heart attack Father     1951-deceased   . Liver disease Mother   . Aneurysm Son 41    Deceased     Past Surgical History:  Procedure Laterality Date  . ANKLE FUSION Right 01/07/2016   Procedure: Right Tibiocalcaneal Fusion;  Surgeon: Nadara Mustard, MD;  Location: [facility] OR;  Service: Orthopedics;  Laterality: Right;  . BACK SURGERY    . CATARACT EXTRACTION W/ INTRAOCULAR LENS  IMPLANT, BILATERAL Bilateral 2007    . CHOLECYSTECTOMY  2000's  . COLONOSCOPY N/A 11/10/2012   Procedure: COLONOSCOPY;  Surgeon: Theda Belfast, MD;  Location: [facility] ENDOSCOPY;  Service: Endoscopy;  Laterality: N/A;  . ESOPHAGOGASTRODUODENOSCOPY N/A 11/10/2012   Procedure: ESOPHAGOGASTRODUODENOSCOPY (EGD);  Surgeon: Theda Belfast, MD;  Location: [facility] ENDOSCOPY;  Service: Endoscopy;  Laterality: N/A;  . EYE SURGERY    . JOINT REPLACEMENT    . POSTERIOR LUMBAR FUSION  2005   "got a rod and 4 pins in there" (11/07/2012)  . REPLACEMENT TOTAL KNEE BILATERAL Bilateral 2007  . TRANSTHORACIC ECHOCARDIOGRAM  05/24/2006  . TUBAL LIGATION  1974  . VAGINAL HYSTERECTOMY  1983   Social History   Occupational History  . Not on file.   Social History Main Topics  . Smoking status: Former Smoker    Packs/day: 0.50    Years: 30.00    Types: Cigarettes    Quit date: 08/09/1981  . Smokeless tobacco: Never Used  . Alcohol use No  . Drug use: No  . Sexual activity: Not Currently

## 2016-02-12 ENCOUNTER — Ambulatory Visit (INDEPENDENT_AMBULATORY_CARE_PROVIDER_SITE_OTHER): Payer: Medicare Other | Admitting: Orthopedic Surgery

## 2016-02-26 ENCOUNTER — Encounter (INDEPENDENT_AMBULATORY_CARE_PROVIDER_SITE_OTHER): Payer: Self-pay | Admitting: Orthopedic Surgery

## 2016-02-26 ENCOUNTER — Ambulatory Visit (INDEPENDENT_AMBULATORY_CARE_PROVIDER_SITE_OTHER): Payer: Medicare Other | Admitting: Orthopedic Surgery

## 2016-02-26 DIAGNOSIS — L97421 Non-pressure chronic ulcer of left heel and midfoot limited to breakdown of skin: Secondary | ICD-10-CM | POA: Insufficient documentation

## 2016-02-26 DIAGNOSIS — M86271 Subacute osteomyelitis, right ankle and foot: Secondary | ICD-10-CM

## 2016-02-26 DIAGNOSIS — Z981 Arthrodesis status: Secondary | ICD-10-CM

## 2016-02-26 NOTE — Progress Notes (Signed)
Office Visit Note   Patient: Andrea Yu           Date of Birth: [birth year]           MRN: [id number] Visit Date: 02/26/2016              Requested by: Jarome Matin, MD [address] [place], [place] [address] PCP: Garlan Fillers, MD  Chief Complaint  Patient presents with  . Right Foot - Routine Post Op    [id number] is status post tibial calcaneal fusion she developed a decubitus heel ulcer and then exposed hardware and the hardware was removed patient has been discharged from skilled care and she is seen today for evaluation. HPI  Assessment & Plan: Visit Diagnoses:  1. Subacute osteomyelitis, right ankle and foot (HCC)   2. Status post ankle arthrodesis   3. Ulcer of left heel, limited to breakdown of skin (HCC)     Plan: We'll have her continue antibiotic ointment dressing changes she has venous stasis swelling in the leg and discussed the importance of elevation and using compression stockings patient states she cannot get compression stockings on and we'll just use elevation. Discussed the importance of wearing the PR AFO 24 hours a day follow-up in 3 weeks.  Follow-Up Instructions: Return in about 3 weeks (around 03/18/2016).   Ortho Exam Examination patient is alert oriented no adenopathy she is ambulating in a wheelchair. She has a persistent decubitus heel ulcer on the right, ulcer is approximately 15 mm in diameter and 5 mm deep, there is no exposed bone there is no synovitis no drainage no odor. She has significant venous stasis swelling in the right lower extremity but no venous stasis ulcers. Her foot is plantar grade.  Imaging: No results found.  Orders:  No orders of the defined types were placed in this encounter.  No orders of the defined types were placed in this encounter.    Procedures: No procedures performed  Clinical Data: No additional findings.  Subjective: Review of Systems  Objective: Vital Signs: There were no vitals taken  for this visit.  Specialty Comments:  No specialty comments available.  PMFS History: Patient Active Problem List   Diagnosis Date Noted  . Ulcer of left heel, limited to breakdown of skin (HCC) 02/26/2016  . 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 Subacute osteomyelitis, right ankle and foot (HCC)   . SAH (subarachnoid hemorrhage) (HCC) 09/14/2014  . A-fib (HCC) 09/14/2014  . Hypertension 09/14/2014  . Laceration of eyebrow, left 09/14/2014  . Fall 09/14/2014  . Gastritis, acute 11/11/2012  . Cellulitis of leg, right 11/11/2012  . Obesity (BMI 30-39.9)- suspected sleep apnea, declines sleep study 11/10/2012  . Psoriatic arthritis (HCC) 11/06/2012  . Iron deficiency anemia- transfused this admission 11/06/2012  . Depression 11/06/2012  . Peripheral neuropathy (HCC) 11/06/2012  . Shock circulatory on admission 11/06/12 11/06/2012  . Systemic inflammatory response syndrome (SIRS) (HCC) 11/06/2012  . Nausea and vomiting 08/09/2012  . Adrenal insufficiency (HCC) 08/09/2012  . PAF (paroxysmal atrial fibrillation) (HCC) 08/09/2012  . Hypotension 08/09/2012  . Dehydration 08/09/2012   Past Medical History:  Diagnosis Date  . A-fib (HCC)    "just after knee 2nd OR" (11/07/2012)  . Anemia   . Arthritis    RA , SACROTIC ARTHRITIS  . Complication of anesthesia    found to be in afib when she woke up  . Dysrhythmia   . GERD (gastroesophageal reflux disease)   . Head injury, closed,  with concussion   . History of blood transfusion    "after 1 of my knee ORs and today" (11/07/2012)  . Hypertension   . Peripheral neuropathy (HCC)    "from back OR in 2005; in hands and feet" (11/07/2012)  . Psoriatic arthritis (HCC)    on Prednisone Rx    Family History  Problem Relation Age of Onset  . Heart attack Father     1951-deceased   . Liver disease Mother   . Aneurysm Son 8    Deceased     Past Surgical History:  Procedure Laterality Date  . ANKLE FUSION Right 01/07/2016    Procedure: Right Tibiocalcaneal Fusion;  Surgeon: Nadara Mustard, MD;  Location: [facility] OR;  Service: Orthopedics;  Laterality: Right;  . BACK SURGERY    . CATARACT EXTRACTION W/ INTRAOCULAR LENS  IMPLANT, BILATERAL Bilateral 2007  . CHOLECYSTECTOMY  2000's  . COLONOSCOPY N/A 11/10/2012   Procedure: COLONOSCOPY;  Surgeon: Theda Belfast, MD;  Location: [facility] ENDOSCOPY;  Service: Endoscopy;  Laterality: N/A;  . ESOPHAGOGASTRODUODENOSCOPY N/A 11/10/2012   Procedure: ESOPHAGOGASTRODUODENOSCOPY (EGD);  Surgeon: Theda Belfast, MD;  Location: [facility] ENDOSCOPY;  Service: Endoscopy;  Laterality: N/A;  . EYE SURGERY    . JOINT REPLACEMENT    . POSTERIOR LUMBAR FUSION  2005   "got a rod and 4 pins in there" (11/07/2012)  . REPLACEMENT TOTAL KNEE BILATERAL Bilateral 2007  . TRANSTHORACIC ECHOCARDIOGRAM  05/24/2006  . TUBAL LIGATION  1974  . VAGINAL HYSTERECTOMY  1983   Social History   Occupational History  . Not on file.   Social History Main Topics  . Smoking status: Former Smoker    Packs/day: 0.50    Years: 30.00    Types: Cigarettes    Quit date: 08/09/1981  . Smokeless tobacco: Never Used  . Alcohol use No  . Drug use: No  . Sexual activity: Not Currently

## 2016-03-05 ENCOUNTER — Ambulatory Visit: Payer: Medicare Other | Admitting: Cardiovascular Disease

## 2016-03-18 ENCOUNTER — Encounter (INDEPENDENT_AMBULATORY_CARE_PROVIDER_SITE_OTHER): Payer: Self-pay | Admitting: Orthopedic Surgery

## 2016-03-18 ENCOUNTER — Ambulatory Visit (INDEPENDENT_AMBULATORY_CARE_PROVIDER_SITE_OTHER): Payer: Medicare Other | Admitting: Orthopedic Surgery

## 2016-03-18 DIAGNOSIS — L97421 Non-pressure chronic ulcer of left heel and midfoot limited to breakdown of skin: Secondary | ICD-10-CM

## 2016-03-18 NOTE — Progress Notes (Signed)
Office Visit Note   Patient: Andrea Yu           Date of Birth: May 15, 1940           MRN: [id number] Visit Date: 03/18/2016              Requested by: Jarome Matin, MD [address] [place], [place] [address] PCP: Garlan Fillers, MD   Assessment & Plan: Visit Diagnoses:  1. Ulcer of left heel, limited to breakdown of skin (HCC)     Plan: Continue with the PRAFO 24 hours a day. Continue with dialysis of cleansing antibiotic ointment and a Band-Aid to the right heel. Discussed with the internal rotation we could place her in a short leg cast to prevent the internal rotation. Patient states that she cannot manage her cast and would like to continue with current care.  Follow-Up Instructions: Return in about 4 weeks (around 04/15/2016).   Orders:  No orders of the defined types were placed in this encounter.  No orders of the defined types were placed in this encounter.     Procedures: No procedures performed   Clinical Data: No additional findings.   Subjective: Chief Complaint  Patient presents with  . Right Foot - Follow-up    Patient is status post tibial calcaneal fusion. Doing well. No pain. Ambulates in wheelchair today. Some WB with walker. Does PT/OT twice a week for each. Some swelling. Ulcer in the back of the heel is still draining. She states they change her dressing once a day. Overall doing fine.     Review of Systems   Objective: Vital Signs: There were no vitals taken for this visit.  Physical Exam on examination patient is alert oriented no adenopathy well-dressed normal affect normal respiratory effort she reports in a wheelchair. Examination with her knee straight the foot aligns with the second ray in the tibial crest. She does have venous stasis swelling. Patient has a persistent decubitus heel ulcer this measured 7 mm in diameter 2 mm deep there is fibrinous exudative tissue at the base there is no cellulitis no drainage no odor no  signs of infection.  Ortho Exam  Specialty Comments:  No specialty comments available.  Imaging: No results found.   PMFS History: Patient Active Problem List   Diagnosis Date Noted  . Ulcer of left heel, limited to breakdown of skin (HCC) 02/26/2016  . 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 Subacute osteomyelitis, right ankle and foot (HCC)   . SAH (subarachnoid hemorrhage) (HCC) 09/14/2014  . A-fib (HCC) 09/14/2014  . Hypertension 09/14/2014  . Laceration of eyebrow, left 09/14/2014  . Fall 09/14/2014  . Gastritis, acute 11/11/2012  . Cellulitis of leg, right 11/11/2012  . Obesity (BMI 30-39.9)- suspected sleep apnea, declines sleep study 11/10/2012  . Psoriatic arthritis (HCC) 11/06/2012  . Iron deficiency anemia- transfused this admission 11/06/2012  . Depression 11/06/2012  . Peripheral neuropathy (HCC) 11/06/2012  . Shock circulatory on admission 11/06/12 11/06/2012  . Systemic inflammatory response syndrome (SIRS) (HCC) 11/06/2012  . Nausea and vomiting 08/09/2012  . Adrenal insufficiency (HCC) 08/09/2012  . PAF (paroxysmal atrial fibrillation) (HCC) 08/09/2012  . Hypotension 08/09/2012  . Dehydration 08/09/2012   Past Medical History:  Diagnosis Date  . A-fib (HCC)    "just after knee 2nd OR" (11/07/2012)  . Anemia   . Arthritis    RA , SACROTIC ARTHRITIS  . Complication of anesthesia    found to be in afib when she woke  up  . Dysrhythmia   . GERD (gastroesophageal reflux disease)   . Head injury, closed, with concussion   . History of blood transfusion    "after 1 of my knee ORs and today" (11/07/2012)  . Hypertension   . Peripheral neuropathy (HCC)    "from back OR in 2005; in hands and feet" (11/07/2012)  . Psoriatic arthritis (HCC)    on Prednisone Rx    Family History  Problem Relation Age of Onset  . Heart attack Father     1951-deceased   . Liver disease Mother   . Aneurysm Son 16    Deceased     Past Surgical History:  Procedure  Laterality Date  . ANKLE FUSION Right 01/07/2016   Procedure: Right Tibiocalcaneal Fusion;  Surgeon: Nadara Mustard, MD;  Location: [facility] OR;  Service: Orthopedics;  Laterality: Right;  . BACK SURGERY    . CATARACT EXTRACTION W/ INTRAOCULAR LENS  IMPLANT, BILATERAL Bilateral 2007  . CHOLECYSTECTOMY  2000's  . COLONOSCOPY N/A 11/10/2012   Procedure: COLONOSCOPY;  Surgeon: Theda Belfast, MD;  Location: [facility] ENDOSCOPY;  Service: Endoscopy;  Laterality: N/A;  . ESOPHAGOGASTRODUODENOSCOPY N/A 11/10/2012   Procedure: ESOPHAGOGASTRODUODENOSCOPY (EGD);  Surgeon: Theda Belfast, MD;  Location: [facility] ENDOSCOPY;  Service: Endoscopy;  Laterality: N/A;  . EYE SURGERY    . JOINT REPLACEMENT    . POSTERIOR LUMBAR FUSION  2005   "got a rod and 4 pins in there" (11/07/2012)  . REPLACEMENT TOTAL KNEE BILATERAL Bilateral 2007  . TRANSTHORACIC ECHOCARDIOGRAM  05/24/2006  . TUBAL LIGATION  1974  . VAGINAL HYSTERECTOMY  1983   Social History   Occupational History  . Not on file.   Social History Main Topics  . Smoking status: Former Smoker    Packs/day: 0.50    Years: 30.00    Types: Cigarettes    Quit date: 08/09/1981  . Smokeless tobacco: Never Used  . Alcohol use No  . Drug use: No  . Sexual activity: Not Currently

## 2016-04-09 ENCOUNTER — Other Ambulatory Visit (INDEPENDENT_AMBULATORY_CARE_PROVIDER_SITE_OTHER): Payer: Self-pay | Admitting: Family

## 2016-04-09 ENCOUNTER — Telehealth (INDEPENDENT_AMBULATORY_CARE_PROVIDER_SITE_OTHER): Payer: Self-pay | Admitting: Orthopedic Surgery

## 2016-04-09 MED ORDER — DOXYCYCLINE HYCLATE 100 MG PO TABS: 100.0000 mg | ORAL_TABLET | Freq: Two times a day (BID) | ORAL | 0 refills | Status: DC

## 2016-04-09 NOTE — Telephone Encounter (Signed)
Refill

## 2016-04-09 NOTE — Telephone Encounter (Signed)
Called pt to advise that this was faxed to the [facility]. She has a follow up appt on Thursday.

## 2016-04-09 NOTE — Telephone Encounter (Signed)
Patient called asking if she could get a refill on Doxycyline before her appointment on the 22nd? Or if she needed to wait til she is seen. CB # [phone]

## 2016-04-09 NOTE — Telephone Encounter (Signed)
Yeah i'll refill it

## 2016-04-12 ENCOUNTER — Other Ambulatory Visit (INDEPENDENT_AMBULATORY_CARE_PROVIDER_SITE_OTHER): Payer: Self-pay | Admitting: Orthopedic Surgery

## 2016-04-15 ENCOUNTER — Encounter (INDEPENDENT_AMBULATORY_CARE_PROVIDER_SITE_OTHER): Payer: Self-pay | Admitting: Orthopedic Surgery

## 2016-04-15 ENCOUNTER — Ambulatory Visit (INDEPENDENT_AMBULATORY_CARE_PROVIDER_SITE_OTHER): Payer: Medicare Other | Admitting: Orthopedic Surgery

## 2016-04-15 VITALS — Ht 59.0 in | Wt 197.0 lb

## 2016-04-15 DIAGNOSIS — Z981 Arthrodesis status: Secondary | ICD-10-CM | POA: Diagnosis not present

## 2016-04-15 DIAGNOSIS — L97411 Non-pressure chronic ulcer of right heel and midfoot limited to breakdown of skin: Secondary | ICD-10-CM

## 2016-04-15 NOTE — Progress Notes (Signed)
Office Visit Note   Patient: Andrea Yu           Date of Birth: 04-13-1940           MRN: [id number] Visit Date: 04/15/2016              Requested by: Jarome Matin, MD [address] [place], [place] [address] PCP: Garlan Fillers, MD  Chief Complaint  Patient presents with  . Right Foot - Follow-up    Heel ulceration tibiocalcaneal fusion 12/2015    HPI: The patient is a 76 year old woman who is seen today status post tibial calcaneal fusion on 01/07/2016. Has had persistent right heel ulceration. Is doing and direction ointment dressing changes daily with a band aid home health is assisting. Is wearing a PRAFO around-the-clock. States this week home health used to silver alginate dressing as she had increased drainage. No pain. No concerns.   Assessment & Plan: Visit Diagnoses:  1. Status post ankle arthrodesis   2. 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 Advised santyl dressing changes daily until wound bed is beefy red. Then transition to silvadene. Husband doing wound care on days HH does not come. Continue prafo.   Follow-Up Instructions: Return in about 4 weeks (around 05/13/2016).   Ortho Exam  Physical Exam  Constitutional: Appears well-developed.  Head: Normocephalic.  Eyes: EOM are normal.  Neck: Normal range of motion.  Cardiovascular: Normal rate.   Pulmonary/Chest: Effort normal.  Neurological: Is alert.  Skin: Skin is warm.  Psychiatric: Has a normal mood and affect. Right heel: ulceration is 2 cm x 1 cm. No depth. Does have 100% exudative tissue in wound bed. No drainage today. No surrounding erythema. No odor. No cellulitis.   Imaging: No results found.  Labs: Lab Results  Component Value Date   REPTSTATUS 11/07/2012 FINAL 11/06/2012   CULT NO GROWTH Performed at [facility] 11/06/2012    Orders:  No orders of the defined types were placed in  this encounter.  No orders of the defined types were placed in this encounter.    Procedures: No procedures performed  Clinical Data: No additional findings.  ROS: Review of Systems  Constitutional: Negative for chills and fever.  Skin: Positive for wound.    Objective: Vital Signs: Ht 4\' 11"  (1.499 m)   Wt 197 lb (89.4 kg)   BMI 39.79 kg/m   Specialty Comments:  No specialty comments available.  PMFS History: Patient Active Problem List   Diagnosis Date Noted  . 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 Subacute osteomyelitis, right ankle and foot (HCC)   . SAH (subarachnoid hemorrhage) (HCC) 09/14/2014  . A-fib (HCC) 09/14/2014  . Hypertension 09/14/2014  . Laceration of eyebrow, left 09/14/2014  . Fall 09/14/2014  . Gastritis, acute 11/11/2012  . Cellulitis of leg, right 11/11/2012  . Obesity (BMI 30-39.9)- suspected sleep apnea, declines sleep study 11/10/2012  . Psoriatic arthritis (HCC) 11/06/2012  . Iron deficiency anemia- transfused this admission 11/06/2012  . Depression 11/06/2012  . Peripheral neuropathy (HCC) 11/06/2012  . Shock circulatory on admission 11/06/12 11/06/2012  . Systemic inflammatory response syndrome (SIRS) (HCC) 11/06/2012  . Nausea and vomiting 08/09/2012  . Adrenal insufficiency (HCC) 08/09/2012  . PAF (paroxysmal atrial fibrillation) (HCC) 08/09/2012  . Hypotension 08/09/2012  .  Dehydration 08/09/2012   Past Medical History:  Diagnosis Date  . A-fib (HCC)    "just after knee 2nd OR" (11/07/2012)  . Anemia   . Arthritis    RA , SACROTIC ARTHRITIS  . Complication of anesthesia    found to be in afib when she woke up  . Dysrhythmia   . GERD (gastroesophageal reflux disease)   . Head injury, closed, with concussion   . History of blood transfusion    "after 1 of my knee ORs and today" (11/07/2012)  . Hypertension   .  Peripheral neuropathy (HCC)    "from back OR in 2005; in hands and feet" (11/07/2012)  . Psoriatic arthritis (HCC)    on Prednisone Rx    Family History  Problem Relation Age of Onset  . Heart attack Father     1951-deceased   . Liver disease Mother   . Aneurysm Son 58    Deceased     Past Surgical History:  Procedure Laterality Date  . ANKLE FUSION Right 01/07/2016   Procedure: Right Tibiocalcaneal Fusion;  Surgeon: Nadara Mustard, MD;  Location: [facility] OR;  Service: Orthopedics;  Laterality: Right;  . BACK SURGERY    . CATARACT EXTRACTION W/ INTRAOCULAR LENS  IMPLANT, BILATERAL Bilateral 2007  . CHOLECYSTECTOMY  2000's  . COLONOSCOPY N/A 11/10/2012   Procedure: COLONOSCOPY;  Surgeon: Theda Belfast, MD;  Location: [facility] ENDOSCOPY;  Service: Endoscopy;  Laterality: N/A;  . ESOPHAGOGASTRODUODENOSCOPY N/A 11/10/2012   Procedure: ESOPHAGOGASTRODUODENOSCOPY (EGD);  Surgeon: Theda Belfast, MD;  Location: [facility] ENDOSCOPY;  Service: Endoscopy;  Laterality: N/A;  . EYE SURGERY    . JOINT REPLACEMENT    . POSTERIOR LUMBAR FUSION  2005   "got a rod and 4 pins in there" (11/07/2012)  . REPLACEMENT TOTAL KNEE BILATERAL Bilateral 2007  . TRANSTHORACIC ECHOCARDIOGRAM  05/24/2006  . TUBAL LIGATION  1974  . VAGINAL HYSTERECTOMY  1983   Social History   Occupational History  . Not on file.   Social History Main Topics  . Smoking status: Former Smoker    Packs/day: 0.50    Years: 30.00    Types: Cigarettes    Quit date: 08/09/1981  . Smokeless tobacco: Never Used  . Alcohol use No  . Drug use: No  . Sexual activity: Not Currently

## 2016-04-16 ENCOUNTER — Other Ambulatory Visit (INDEPENDENT_AMBULATORY_CARE_PROVIDER_SITE_OTHER): Payer: Self-pay

## 2016-04-16 ENCOUNTER — Telehealth (INDEPENDENT_AMBULATORY_CARE_PROVIDER_SITE_OTHER): Payer: Self-pay | Admitting: Radiology

## 2016-04-16 MED ORDER — SILVER SULFADIAZINE 1 % EX CREA: 1.0000 "application " | TOPICAL_CREAM | Freq: Every day | CUTANEOUS | 0 refills | Status: DC

## 2016-04-16 NOTE — Telephone Encounter (Signed)
Melissa from [facility] called wanting new wound care instructions for patient.  Patient was seen yesterday, 04/15/16 by Dr. Lajoyce Corners and informed them that she was given new wound care instructions. Melissa's callback number [phone]

## 2016-04-16 NOTE — Telephone Encounter (Signed)
Called and sw nurse to advise that the pt is to use santyl until tissue is beefy red and then change to silvadene per last dictation. The pt did not have rx for silvadene so this was faxed into pharm.

## 2016-04-16 NOTE — Telephone Encounter (Signed)
Melissa from [facility] called wanting new wound care instructions for patient.  Patient was seen yesterday, 04/15/16 by Dr. Duda and informed them that she was given new wound care instructions. Melissa's callback number [phone] 

## 2016-04-22 ENCOUNTER — Other Ambulatory Visit (INDEPENDENT_AMBULATORY_CARE_PROVIDER_SITE_OTHER): Payer: Self-pay | Admitting: Orthopedic Surgery

## 2016-04-22 ENCOUNTER — Telehealth (INDEPENDENT_AMBULATORY_CARE_PROVIDER_SITE_OTHER): Payer: Self-pay | Admitting: *Deleted

## 2016-04-22 NOTE — Telephone Encounter (Signed)
Pt calling stating her boot is worn out and is needing another one if possible.

## 2016-04-22 NOTE — Telephone Encounter (Signed)
Advised yes we can give her another [facility] boot we will just refile with her insurance.

## 2016-04-28 ENCOUNTER — Ambulatory Visit (INDEPENDENT_AMBULATORY_CARE_PROVIDER_SITE_OTHER): Payer: Medicare Other | Admitting: Family

## 2016-04-28 DIAGNOSIS — M79671 Pain in right foot: Secondary | ICD-10-CM

## 2016-04-28 DIAGNOSIS — L97411 Non-pressure chronic ulcer of right heel and midfoot limited to breakdown of skin: Secondary | ICD-10-CM

## 2016-04-28 NOTE — Telephone Encounter (Signed)
Nurse visit is made, and charge entered for prafo boot to be filed with insurance.

## 2016-04-28 NOTE — Progress Notes (Signed)
Nurse only visit for new [facility] boot

## 2016-05-13 ENCOUNTER — Ambulatory Visit (INDEPENDENT_AMBULATORY_CARE_PROVIDER_SITE_OTHER): Payer: Medicare Other | Admitting: Orthopedic Surgery

## 2016-05-13 ENCOUNTER — Encounter (INDEPENDENT_AMBULATORY_CARE_PROVIDER_SITE_OTHER): Payer: Self-pay | Admitting: Orthopedic Surgery

## 2016-05-13 VITALS — Ht 59.0 in | Wt 197.0 lb

## 2016-05-13 DIAGNOSIS — Z981 Arthrodesis status: Secondary | ICD-10-CM | POA: Diagnosis not present

## 2016-05-13 DIAGNOSIS — L89612 Pressure ulcer of right heel, stage 2: Secondary | ICD-10-CM

## 2016-05-13 DIAGNOSIS — L89603 Pressure ulcer of unspecified heel, stage 3: Secondary | ICD-10-CM | POA: Insufficient documentation

## 2016-05-13 NOTE — Progress Notes (Signed)
Office Visit Note   Patient: Andrea Yu           Date of Birth: 09-06-1940           MRN: [id number] Visit Date: 05/13/2016              Requested by: Jarome Matin, MD [address] [place], [place] [address] PCP: Garlan Fillers, MD  Chief Complaint  Patient presents with  . Right Foot - Follow-up    01/07/16 right tib- cal fusion      HPI: The patient is a 76 year old woman who presents today in follow-up for ulceration to the right heel. She is status post tibial calcaneal fusion on December 13. She is in a PRAFO boot. Has been nonweightbearing with a wheelchair. She is doing daily Silvadene dressing changes. There is a small amount of drainage on the dressing today. The patient is concerned that her PRAFO is touching the bottom of her heel. It puts pressure on the bottom of her heel when she walks. States she has discontinued her physical therapy as she does not want to put increased pressure on the heel wound.  Assessment & Plan: Visit Diagnoses:  1. Status post ankle arthrodesis   2. Decubitus ulcer of right heel, stage 2     Plan: may resume PT, wbat. Is to wear the PRAFO at rest. This is not for ambulation. To wear something with open heel with ambulation. Continue with daily dial soap cleansing.   Follow-Up Instructions: No Follow-up on file.   Ortho Exam  Patient is alert, oriented, no adenopathy, well-dressed, normal affect, normal respiratory effort. Right foot: has a posterior heel ulcer. This is 12 mm x 5 mm. This is filled in with granulation tissue. Has a small amount of serosanguinous dressings. There is no purulence. No surrounding erythema. No odor. No sign of infection. The PRAFO was reapplied. Does not contact the posterior heel ulcer.   Imaging: No results found.  Labs: Lab Results  Component Value Date   REPTSTATUS 11/07/2012 FINAL 11/06/2012   CULT NO GROWTH Performed at [facility] 11/06/2012    Orders:  No orders of the  defined types were placed in this encounter.  No orders of the defined types were placed in this encounter.    Procedures: No procedures performed  Clinical Data: No additional findings.  ROS:  All other systems negative, except as noted in the HPI. Review of Systems  Constitutional: Negative for chills and fever.  Skin: Positive for wound. Negative for color change.    Objective: Vital Signs: Ht 4\' 11"  (1.499 m)   Wt 197 lb (89.4 kg)   BMI 39.79 kg/m   Specialty Comments:  No specialty comments available.  PMFS History: Patient Active Problem List   Diagnosis Date Noted  . Decubitus ulcer of heel, stage 2 05/13/2016  . 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 Subacute osteomyelitis, right ankle and foot (HCC)   . SAH (subarachnoid hemorrhage) (HCC) 09/14/2014  . A-fib (HCC) 09/14/2014  . Hypertension 09/14/2014  . Laceration of eyebrow, left 09/14/2014  . Fall 09/14/2014  . Gastritis, acute 11/11/2012  . Cellulitis of leg, right 11/11/2012  . Obesity (BMI 30-39.9)- suspected sleep apnea, declines sleep study 11/10/2012  . Psoriatic arthritis (HCC) 11/06/2012  . Iron deficiency anemia- transfused this admission 11/06/2012  . Depression 11/06/2012  . Peripheral neuropathy 11/06/2012  .  Shock circulatory on admission 11/06/12 11/06/2012  . Systemic inflammatory response syndrome (SIRS) (HCC) 11/06/2012  . Nausea and vomiting 08/09/2012  . Adrenal insufficiency (HCC) 08/09/2012  . PAF (paroxysmal atrial fibrillation) (HCC) 08/09/2012  . Hypotension 08/09/2012  . Dehydration 08/09/2012   Past Medical History:  Diagnosis Date  . A-fib (HCC)    "just after knee 2nd OR" (11/07/2012)  . Anemia   . Arthritis    RA , SACROTIC ARTHRITIS  . Complication of anesthesia    found to be in afib when she woke up  . Dysrhythmia   . GERD (gastroesophageal reflux  disease)   . Head injury, closed, with concussion   . History of blood transfusion    "after 1 of my knee ORs and today" (11/07/2012)  . Hypertension   . Peripheral neuropathy    "from back OR in 2005; in hands and feet" (11/07/2012)  . Psoriatic arthritis (HCC)    on Prednisone Rx    Family History  Problem Relation Age of Onset  . Heart attack Father     1951-deceased   . Liver disease Mother   . Aneurysm Son 21    Deceased     Past Surgical History:  Procedure Laterality Date  . ANKLE FUSION Right 01/07/2016   Procedure: Right Tibiocalcaneal Fusion;  Surgeon: Nadara Mustard, MD;  Location: [facility] OR;  Service: Orthopedics;  Laterality: Right;  . BACK SURGERY    . CATARACT EXTRACTION W/ INTRAOCULAR LENS  IMPLANT, BILATERAL Bilateral 2007  . CHOLECYSTECTOMY  2000's  . COLONOSCOPY N/A 11/10/2012   Procedure: COLONOSCOPY;  Surgeon: Theda Belfast, MD;  Location: [facility] ENDOSCOPY;  Service: Endoscopy;  Laterality: N/A;  . ESOPHAGOGASTRODUODENOSCOPY N/A 11/10/2012   Procedure: ESOPHAGOGASTRODUODENOSCOPY (EGD);  Surgeon: Theda Belfast, MD;  Location: [facility] ENDOSCOPY;  Service: Endoscopy;  Laterality: N/A;  . EYE SURGERY    . JOINT REPLACEMENT    . POSTERIOR LUMBAR FUSION  2005   "got a rod and 4 pins in there" (11/07/2012)  . REPLACEMENT TOTAL KNEE BILATERAL Bilateral 2007  . TRANSTHORACIC ECHOCARDIOGRAM  05/24/2006  . TUBAL LIGATION  1974  . VAGINAL HYSTERECTOMY  1983   Social History   Occupational History  . Not on file.   Social History Main Topics  . Smoking status: Former Smoker    Packs/day: 0.50    Years: 30.00    Types: Cigarettes    Quit date: 08/09/1981  . Smokeless tobacco: Never Used  . Alcohol use No  . Drug use: No  . Sexual activity: Not Currently

## 2016-05-14 ENCOUNTER — Telehealth (INDEPENDENT_AMBULATORY_CARE_PROVIDER_SITE_OTHER): Payer: Self-pay | Admitting: Orthopedic Surgery

## 2016-05-14 NOTE — Telephone Encounter (Signed)
Okay to discontinue antibiotics.

## 2016-05-14 NOTE — Telephone Encounter (Signed)
I called and spoke with patient her wound is still slightly open, so still unable to take humira. Advised per MD ok to discontinue doxycycline.

## 2016-05-14 NOTE — Telephone Encounter (Signed)
You saw this pt yesterday follow up left heel ulcer. She is asking if she needs to continue her doxy and if you consider her wound to be closed so that she can resume her humira?

## 2016-05-14 NOTE — Telephone Encounter (Signed)
Flor from [facility] called asking about the patients weight bearing restrictions. CB # [phone]

## 2016-05-14 NOTE — Telephone Encounter (Signed)
Called and advised that she is to be weight bearing as tolerated in open heel shoe and is to wear the PRAFO while at rest. HHPT also asked for continued PT for 2 x qwk for 4 weeks advised that this is ok and to call with questions.

## 2016-05-14 NOTE — Telephone Encounter (Signed)
Patient called asking if she should continue her doxycycline? And also if you would consider her wound closed so she could take her humira again? CB # [phone]

## 2016-05-28 ENCOUNTER — Other Ambulatory Visit: Payer: Self-pay | Admitting: Cardiovascular Disease

## 2016-06-08 ENCOUNTER — Encounter (INDEPENDENT_AMBULATORY_CARE_PROVIDER_SITE_OTHER): Payer: Self-pay | Admitting: Orthopedic Surgery

## 2016-06-08 ENCOUNTER — Ambulatory Visit (INDEPENDENT_AMBULATORY_CARE_PROVIDER_SITE_OTHER): Payer: Medicare Other | Admitting: Orthopedic Surgery

## 2016-06-08 VITALS — Ht 59.0 in | Wt 197.0 lb

## 2016-06-08 DIAGNOSIS — Z981 Arthrodesis status: Secondary | ICD-10-CM

## 2016-06-08 NOTE — Progress Notes (Signed)
Office Visit Note   Patient: Andrea Yu           Date of Birth: July 27, 1940           MRN: [id number] Visit Date: 06/08/2016              Requested by: Jarome Matin, MD [address] [place], [place] [address] PCP: Jarome Matin, MD  Chief Complaint  Patient presents with  . Right Ankle - Follow-up    01/07/16 right tib-cal fusion      HPI: Patient is a 76 year old woman who presents 5 months status post right tibial calcaneal fusion. She has been having increased laxity to her midfoot. Patient currently wears a PRAFO at rest and is using a fracture boot for physical therapy she states she is currently doing physical therapy at home daily. Patient feels like her ankle is stable but her forefoot is unstable.  Assessment & Plan: Visit Diagnoses:  1. Status post ankle arthrodesis     Plan: Patient was given a prescription for [employer] for extra-depth shoes custom orthotics and a double upright brace on the right. Patient does have increased laxity to the midfoot causing her foot to be in valgus and pronation but she does have a stable tibial calcaneal fusion. Discussed that she would have increased pain if we proceed with fusion to the midfoot. Patient may discontinue both the fracture boot when she gets her double upright brace is an may discontinue the PRAFO at this time she has no heel ulcers.  Follow-Up Instructions: Return in about 2 months (around 08/08/2016).   Ortho Exam  Patient is alert, oriented, no adenopathy, well-dressed, normal affect, normal respiratory effort. Examination patient elected to the wheelchair. Examination the right lower extremity she has about 10 valgus of the hindfoot which is stable. The plantar and posterior heel ulcers have completely healed she has no redness or cellulitis in her leg there is no tenderness to palpation the fusion is stable. She does have increased laxity through the midfoot with increasing valgus and pronation through the  Lisfranc complex. There is no redness no cellulitis no evidence of an active Charcot process.  Imaging: No results found.  Labs: Lab Results  Component Value Date   REPTSTATUS 11/07/2012 FINAL 11/06/2012   CULT NO GROWTH Performed at [facility] 11/06/2012    Orders:  No orders of the defined types were placed in this encounter.  No orders of the defined types were placed in this encounter.    Procedures: No procedures performed  Clinical Data: No additional findings.  ROS:  All other systems negative, except as noted in the HPI. Review of Systems  Objective: Vital Signs: Ht 4\' 11"  (1.499 m)   Wt 197 lb (89.4 kg)   BMI 39.79 kg/m   Specialty Comments:  No specialty comments available.  PMFS History: Patient Active Problem List   Diagnosis Date Noted  . Decubitus ulcer of heel, stage 2 05/13/2016  . 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 Subacute osteomyelitis, right ankle and foot (HCC)   . SAH (subarachnoid hemorrhage) (HCC) 09/14/2014  . A-fib (HCC) 09/14/2014  . Hypertension 09/14/2014  . Laceration of eyebrow, left 09/14/2014  . Fall 09/14/2014  . Gastritis, acute 11/11/2012  . Cellulitis of leg, right 11/11/2012  . Obesity (BMI 30-39.9)- suspected sleep apnea, declines sleep study 11/10/2012  . Psoriatic arthritis (HCC) 11/06/2012  .  Iron deficiency anemia- transfused this admission 11/06/2012  . Depression 11/06/2012  . Peripheral neuropathy 11/06/2012  . Shock circulatory on admission 11/06/12 11/06/2012  . Systemic inflammatory response syndrome (SIRS) (HCC) 11/06/2012  . Nausea and vomiting 08/09/2012  . Adrenal insufficiency (HCC) 08/09/2012  . PAF (paroxysmal atrial fibrillation) (HCC) 08/09/2012  . Hypotension 08/09/2012  . Dehydration 08/09/2012   Past Medical History:  Diagnosis Date  . A-fib (HCC)    "just after knee  2nd OR" (11/07/2012)  . Anemia   . Arthritis    RA , SACROTIC ARTHRITIS  . Complication of anesthesia    found to be in afib when she woke up  . Dysrhythmia   . GERD (gastroesophageal reflux disease)   . Head injury, closed, with concussion   . History of blood transfusion    "after 1 of my knee ORs and today" (11/07/2012)  . Hypertension   . Peripheral neuropathy    "from back OR in 2005; in hands and feet" (11/07/2012)  . Psoriatic arthritis (HCC)    on Prednisone Rx    Family History  Problem Relation Age of Onset  . Heart attack Father        1951-deceased   . Liver disease Mother   . Aneurysm Son 85       Deceased     Past Surgical History:  Procedure Laterality Date  . ANKLE FUSION Right 01/07/2016   Procedure: Right Tibiocalcaneal Fusion;  Surgeon: Nadara Mustard, MD;  Location: [facility] OR;  Service: Orthopedics;  Laterality: Right;  . BACK SURGERY    . CATARACT EXTRACTION W/ INTRAOCULAR LENS  IMPLANT, BILATERAL Bilateral 2007  . CHOLECYSTECTOMY  2000's  . COLONOSCOPY N/A 11/10/2012   Procedure: COLONOSCOPY;  Surgeon: Theda Belfast, MD;  Location: [facility] ENDOSCOPY;  Service: Endoscopy;  Laterality: N/A;  . ESOPHAGOGASTRODUODENOSCOPY N/A 11/10/2012   Procedure: ESOPHAGOGASTRODUODENOSCOPY (EGD);  Surgeon: Theda Belfast, MD;  Location: [facility] ENDOSCOPY;  Service: Endoscopy;  Laterality: N/A;  . EYE SURGERY    . JOINT REPLACEMENT    . POSTERIOR LUMBAR FUSION  2005   "got a rod and 4 pins in there" (11/07/2012)  . REPLACEMENT TOTAL KNEE BILATERAL Bilateral 2007  . TRANSTHORACIC ECHOCARDIOGRAM  05/24/2006  . TUBAL LIGATION  1974  . VAGINAL HYSTERECTOMY  1983   Social History   Occupational History  . Not on file.   Social History Main Topics  . Smoking status: Former Smoker    Packs/day: 0.50    Years: 30.00    Types: Cigarettes    Quit date: 08/09/1981  . Smokeless tobacco: Never Used  . Alcohol use No  . Drug use: No  . Sexual activity: Not Currently

## 2016-06-23 ENCOUNTER — Telehealth (INDEPENDENT_AMBULATORY_CARE_PROVIDER_SITE_OTHER): Payer: Self-pay | Admitting: Orthopedic Surgery

## 2016-06-23 NOTE — Telephone Encounter (Signed)
06/08/2016 OV NOTE FAXED TO [facility] [phone]

## 2016-07-12 ENCOUNTER — Emergency Department (HOSPITAL_COMMUNITY)
Admission: EM | Admit: 2016-07-12 | Discharge: 2016-07-12 | Disposition: A | Discharge status: Home / Self Care | Payer: Medicare Other | Attending: Emergency Medicine | Admitting: Emergency Medicine

## 2016-07-12 ENCOUNTER — Emergency Department (HOSPITAL_COMMUNITY): Payer: Medicare Other

## 2016-07-12 ENCOUNTER — Encounter (HOSPITAL_COMMUNITY): Payer: Self-pay | Admitting: Emergency Medicine

## 2016-07-12 DIAGNOSIS — Y999 Unspecified external cause status: Secondary | ICD-10-CM | POA: Diagnosis not present

## 2016-07-12 DIAGNOSIS — Z87891 Personal history of nicotine dependence: Secondary | ICD-10-CM | POA: Diagnosis not present

## 2016-07-12 DIAGNOSIS — I10 Essential (primary) hypertension: Secondary | ICD-10-CM | POA: Insufficient documentation

## 2016-07-12 DIAGNOSIS — M79604 Pain in right leg: Secondary | ICD-10-CM

## 2016-07-12 DIAGNOSIS — Z79899 Other long term (current) drug therapy: Secondary | ICD-10-CM | POA: Insufficient documentation

## 2016-07-12 DIAGNOSIS — Y939 Activity, unspecified: Secondary | ICD-10-CM | POA: Diagnosis not present

## 2016-07-12 DIAGNOSIS — Z7982 Long term (current) use of aspirin: Secondary | ICD-10-CM | POA: Insufficient documentation

## 2016-07-12 DIAGNOSIS — Y929 Unspecified place or not applicable: Secondary | ICD-10-CM | POA: Diagnosis not present

## 2016-07-12 DIAGNOSIS — Z96653 Presence of artificial knee joint, bilateral: Secondary | ICD-10-CM | POA: Insufficient documentation

## 2016-07-12 DIAGNOSIS — S8991XA Unspecified injury of right lower leg, initial encounter: Secondary | ICD-10-CM | POA: Diagnosis present

## 2016-07-12 DIAGNOSIS — S82244A Nondisplaced spiral fracture of shaft of right tibia, initial encounter for closed fracture: Secondary | ICD-10-CM | POA: Diagnosis not present

## 2016-07-12 DIAGNOSIS — W04XXXA Fall while being carried or supported by other persons, initial encounter: Secondary | ICD-10-CM | POA: Insufficient documentation

## 2016-07-12 MED ORDER — HYDROCODONE-ACETAMINOPHEN 5-325 MG PO TABS
1.0000 | ORAL_TABLET | Freq: Once | ORAL | Status: AC
  Administered 2016-07-12: 1 via ORAL
  Filled 2016-07-12: qty 1

## 2016-07-12 NOTE — ED Provider Notes (Signed)
[facility]-EMERGENCY DEPT Provider Note   CSN: [id number] Arrival date & time: 07/12/16  1141     History   Chief Complaint Chief Complaint  Patient presents with  . Leg Pain    HPI Andrea Yu is a 76 y.o. female with a PMHx of afib, rheumatoid/psoriatic arthritis, HTN, GERD, anemia, and PSHx of b/l knee replacements and R ankle fusion, who presents to the ED with complaints of right leg pain after she fell on Thursday 4 days ago. Patient states that she is supposed to be wearing a boot and a brace on her right leg however she was not wearing it, attempted to take 3 steps using her walker and her left knee gave out causing her to fall landing on both legs with tangled in the legs of the walker. She states that she has peripheral neuropathy so she does not have great sensation in her legs, she was not hurting until the next day she noticed some bruising and minimal pain in the right toe. Since then the pain has continued every time she attempts to move which is what prompted her to come in today. She describes the pain as 7/10 intermittent burning nonradiating right mid-lower leg pain that only occurs with movement of the leg, and has been improved with Tylenol. She continues to notice bruising. She has chronic numbness/tingling, but that hasn't changed. She's had some swelling in this leg since the surgery 12/2015 and that hasn't changed with this new injury. She denies head injury or LOC, abrasions, neck or back pain, use of blood thinners, recent fevers, chills, CP, SOB, abd pain, N/V/D/C, hematuria, dysuria, other myalgias, new numbness/tingling, focal weakness, or any other complaints at this time.    The history is provided by the patient and medical records. No language interpreter was used.  Leg Pain   This is a new problem. The current episode started more than 2 days ago. The problem occurs daily. The problem has not changed since onset.The pain is present in the right lower leg. Quality:  burning. The pain is at a severity of 7/10. The pain is moderate. Pertinent negatives include no numbness, full range of motion and no tingling. The symptoms are aggravated by activity. She has tried OTC pain medications for the symptoms. The treatment provided moderate relief. There has been a history of trauma. Family history is significant for rheumatoid arthritis.    Past Medical History:  Diagnosis Date  . A-fib (HCC)    "just after knee 2nd OR" (11/07/2012)  . Anemia   . Arthritis    RA , SACROTIC ARTHRITIS  . Complication of anesthesia    found to be in afib when she woke up  . Dysrhythmia   . GERD (gastroesophageal reflux disease)   . Head injury, closed, with concussion   . History of blood transfusion    "after 1 of my knee ORs and today" (11/07/2012)  . Hypertension   . Peripheral neuropathy    "from back OR in 2005; in hands and feet" (11/07/2012)  . Psoriatic arthritis (HCC)    on Prednisone Rx    Patient Active Problem List   Diagnosis Date Noted  . Decubitus ulcer of heel, stage 2 05/13/2016  . 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 Subacute osteomyelitis, right ankle and foot (HCC)   . SAH (subarachnoid hemorrhage) (HCC) 09/14/2014  . A-fib (HCC) 09/14/2014  .  Hypertension 09/14/2014  . Laceration of eyebrow, left 09/14/2014  . Fall 09/14/2014  . Gastritis, acute 11/11/2012  . Cellulitis of leg, right 11/11/2012  . Obesity (BMI 30-39.9)- suspected sleep apnea, declines sleep study 11/10/2012  . Psoriatic arthritis (HCC) 11/06/2012  . Iron deficiency anemia- transfused this admission 11/06/2012  . Depression 11/06/2012  . Peripheral neuropathy 11/06/2012  . Shock circulatory on admission 11/06/12 11/06/2012  . Systemic inflammatory response syndrome (SIRS) (HCC) 11/06/2012  . Nausea and vomiting 08/09/2012  . Adrenal insufficiency (HCC) 08/09/2012  .  PAF (paroxysmal atrial fibrillation) (HCC) 08/09/2012  . Hypotension 08/09/2012  . Dehydration 08/09/2012    Past Surgical History:  Procedure Laterality Date  . ANKLE FUSION Right 01/07/2016   Procedure: Right Tibiocalcaneal Fusion;  Surgeon: Nadara Mustard, MD;  Location: [facility] OR;  Service: Orthopedics;  Laterality: Right;  . BACK SURGERY    . CATARACT EXTRACTION W/ INTRAOCULAR LENS  IMPLANT, BILATERAL Bilateral 2007  . CHOLECYSTECTOMY  2000's  . COLONOSCOPY N/A 11/10/2012   Procedure: COLONOSCOPY;  Surgeon: Theda Belfast, MD;  Location: [facility] ENDOSCOPY;  Service: Endoscopy;  Laterality: N/A;  . ESOPHAGOGASTRODUODENOSCOPY N/A 11/10/2012   Procedure: ESOPHAGOGASTRODUODENOSCOPY (EGD);  Surgeon: Theda Belfast, MD;  Location: [facility] ENDOSCOPY;  Service: Endoscopy;  Laterality: N/A;  . EYE SURGERY    . JOINT REPLACEMENT    . POSTERIOR LUMBAR FUSION  2005   "got a rod and 4 pins in there" (11/07/2012)  . REPLACEMENT TOTAL KNEE BILATERAL Bilateral 2007  . TRANSTHORACIC ECHOCARDIOGRAM  05/24/2006  . TUBAL LIGATION  1974  . VAGINAL HYSTERECTOMY  1983    OB History    No data available       Home Medications    Prior to Admission medications   Medication Sig Start Date End Date Taking? Authorizing Provider  acetaminophen (TYLENOL) 325 MG tablet Take 2 tablets (650 mg total) by mouth every 4 (four) hours as needed. Patient taking differently: Take 325 mg by mouth every 6 (six) hours as needed (for pain.).  11/11/12   Christiane Ha, MD  Apremilast (OTEZLA) 30 MG TABS Take 30 mg by mouth 2 (two) times daily.    [provider]  aspirin EC 81 MG tablet Take 81 mg by mouth every evening.    [provider]  BIOTIN 5000 PO Take 5,000 mg by mouth at bedtime.     [provider]  collagenase (SANTYL) ointment Apply 1 application topically daily. Applied to [employer], Historical, MD  diltiazem (CARDIZEM CD) 180 MG 24 hr capsule Take 1 capsule (180 mg  total) by mouth daily. Patient taking differently: Take 180 mg by mouth every evening.  11/11/12   Christiane Ha, MD  DULoxetine (CYMBALTA) 60 MG capsule Take 60 mg by mouth daily. Take along with 30mg  capsule to make a total of 90mg     [provider]  furosemide (LASIX) 40 MG tablet TAKE 2 TABLETS BY MOUTH  DAILY 05/28/16   Croitoru, Mihai, MD  gabapentin (NEURONTIN) 600 MG tablet Take 600-1,200 mg by mouth 2 (two) times daily. 1 tablet in morning and 2 tablets at night    [provider]  halobetasol (ULTRAVATE) 0.05 % ointment Apply 1 application topically 2 (two) times daily. For psorasis 12/10/15   [provider]  HUMIRA PEN 40 MG/0.8ML PNKT Inject 40 mg as directed every 14 (fourteen) days. On Tuesdays. 01/28/15   [provider]  HYDROcodone-acetaminophen (NORCO/VICODIN) 5-325 MG per tablet Take  1-2 tablets by mouth every 6 (six) hours as needed (for pain (scheduled 1 tablet at bedtime)).     [provider]  losartan (COZAAR) 100 MG tablet Take 100 mg by mouth daily.  08/23/15   [provider]  Magnesium 250 MG TABS Take 1 tablet by mouth every evening.     [provider]  metoprolol succinate (TOPROL-XL) 100 MG 24 hr tablet TAKE 1 TABLET BY MOUTH  DAILY WITH OR IMMEDIATLEY  FOLLOWING A MEAL 10/13/15   Croitoru, Mihai, MD  Multiple Vitamin (MULTIVITAMIN WITH MINERALS) TABS Take 1 tablet by mouth daily.    [provider]  mupirocin ointment (BACTROBAN) 2 % Apply 1 application topically 2 (two) times daily. Apply to the affected area 2 times a day 01/29/16   Nadara Mustard, MD  omeprazole (PRILOSEC) 20 MG capsule Take 20 mg by mouth every evening. 11/08/15   [provider]  potassium chloride SA (K-DUR,KLOR-CON) 20 MEQ tablet Take 20 mEq by mouth 2 (two) times daily.     [provider]  predniSONE (DELTASONE) 5 MG tablet Take 5 mg by mouth daily.  08/12/12   Tisovec, Adelfa Koh, MD  PROAIR HFA 108 (90  BASE) MCG/ACT inhaler Inhale 1-2 puffs into the lungs every 6 (six) hours as needed for wheezing or shortness of breath.  02/06/13   [provider]  silver sulfADIAZINE (SILVADENE) 1 % cream Apply 1 application topically daily. 04/16/16   Adonis Huguenin, NP  Vitamin D, Ergocalciferol, (DRISDOL) 50000 UNITS CAPS Take 50,000 Units by mouth every Tuesday.     [provider]    Family History Family History  Problem Relation Age of Onset  . Heart attack Father        1951-deceased   . Liver disease Mother   . Aneurysm Son 80       Deceased     Social History Social History  Substance Use Topics  . Smoking status: Former Smoker    Packs/day: 0.50    Years: 30.00    Types: Cigarettes    Quit date: 08/09/1981  . Smokeless tobacco: Never Used  . Alcohol use No     Allergies   Penicillins; Prolia [denosumab]; and Fosamax [alendronate sodium]   Review of Systems Review of Systems  Constitutional: Negative for chills and fever.  HENT: Negative for facial swelling (no head inj).   Respiratory: Negative for shortness of breath.   Cardiovascular: Negative for chest pain.  Gastrointestinal: Negative for abdominal pain, constipation, diarrhea, nausea and vomiting.  Genitourinary: Negative for dysuria and hematuria.  Musculoskeletal: Positive for arthralgias and joint swelling. Negative for back pain and neck pain.  Skin: Negative for color change.  Allergic/Immunologic: Positive for immunocompromised state (chronic prednisone).  Neurological: Negative for tingling, syncope, weakness and numbness.  Hematological: Does not bruise/bleed easily.  Psychiatric/Behavioral: Negative for confusion.   All other systems reviewed and are negative for acute change except as noted in the HPI.    Physical Exam Updated Vital Signs BP 128/87 (BP Location: Right Arm)   Pulse 71   Temp 98.9 F (37.2 C) (Oral)   Resp 18   Ht 4\' 11"  (1.499 m)   Wt 83.9 kg (185 lb)   SpO2 96%    BMI 37.37 kg/m    Physical Exam  Constitutional: She is oriented to person, place, and time. Vital signs are normal. She appears well-developed and well-nourished.  Non-toxic appearance. No distress.  Afebrile, nontoxic, NAD  HENT:  Head: Normocephalic and atraumatic.  Mouth/Throat: Oropharynx is clear and moist and mucous membranes are normal.  Eyes: Conjunctivae and EOM are normal. Right eye exhibits no discharge. Left eye exhibits no discharge.  Neck: Normal range of motion. Neck supple.  Cardiovascular: Normal rate, regular rhythm, normal heart sounds and intact distal pulses.  Exam reveals no gallop and no friction rub.   No murmur heard. Pulmonary/Chest: Effort normal and breath sounds normal. No respiratory distress. She has no decreased breath sounds. She has no wheezes. She has no rhonchi. She has no rales.  Abdominal: Soft. Normal appearance and bowel sounds are normal. She exhibits no distension. There is no tenderness. There is no rigidity, no rebound, no guarding, no CVA tenderness, no tenderness at McBurney's point and negative Murphy's sign.  Musculoskeletal: Normal range of motion.       Right lower leg: She exhibits tenderness, bony tenderness and swelling. She exhibits no deformity and no laceration.  R lower leg with +swelling diffusely from just under the knee down to the ankle, +crepitus to mid-calf area, without deformity, with moderate TTP of the mid-calf, but no TTP of fore foot or ankle. No break in skin. +bruising to proximal lower leg and extending down to the ankle area, as well as bruising to R great toe. No erythema or warmth. Achilles intact. Good pedal pulse and cap refill of all toes. Wiggling toes without difficulty. Sensation grossly intact. Soft compartments   Neurological: She is alert and oriented to person, place, and time. She has normal strength. No sensory deficit.  Skin: Skin is warm, dry and intact. No rash noted.  Psychiatric: She has a normal mood and  affect.  Nursing note and vitals reviewed.    ED Treatments / Results  Labs (all labs ordered are listed, but only abnormal results are displayed) Labs Reviewed - No data to display  EKG  EKG Interpretation None       Radiology Dg Tibia/fibula Right  Result Date: 07/12/2016 CLINICAL DATA:  Status post fall yesterday when transferring herself from wheelchair to the bed. EXAM: RIGHT TIBIA AND FIBULA - 2 VIEW COMPARISON:  None in PACs FINDINGS: There is an acute spiral fracture of the proximal and mid shafts of the right tibia. The prosthetic knee is grossly normal in appearance. The into medullary rod and screw in the distal tibia for fixation of an old distal tibial fracture appears stable. The fibula exhibits chronic change distally. IMPRESSION: There is an acute spiral fracture of the proximal and midshaft of the right tibia. It does not appear to involve the prosthetic right knee. The previously placed intramedullary rod and fusion screws across the right ankle and distal tibia appear in reasonable position. Extensive post traumatic change of the right ankle is present. Electronically Signed   By: David  [place] M.D.   On: 07/12/2016 12:44   Dg Ankle Complete Right  Result Date: 07/12/2016 CLINICAL DATA:  Status post fall yesterday transferring her cell from a wheelchair to the bed. Patient reports right lower leg pain EXAM: RIGHT ANKLE - COMPLETE 3+ VIEW COMPARISON:  Right ankle August 21, 2015 FINDINGS: The patient has previously undergone intramedullary rod placement for pathology of the tibiotalar joint. A transcalcaneal securing screw is present as well as a screw through the distal tibial shaft. There is considerable heterotopic bone formation. There is an old avulsion of the medial malleolus. IMPRESSION: Extensive chronic post traumatic and postsurgical changes of the ankle. No acute fracture is observed. Electronically Signed  By: David  [place] M.D.   On: 07/12/2016 12:48   Dg Foot  Complete Right  Result Date: 07/12/2016 CLINICAL DATA:  Status post fall yesterday while transferring from the wheelchair to a bed. Bruising of the right great toe. EXAM: RIGHT FOOT COMPLETE - 3+ VIEW COMPARISON:  Right ankle of today's date and right ankle and right foot of August 21, 2015 FINDINGS: The bones are subjectively osteopenic. No acute phalangeal or metatarsal fracture is observed. An old fracture through the head and shaft of the third metatarsal is present. The joint spaces are reasonably well-maintained. There is mild soft tissue swelling over the dorsum of the foot. Extensive post traumatic and post ORIF changes of the calcaneus and talus and distal tibia are present. IMPRESSION: No acute fracture of the right foot is observed. Specific attention to the great toe reveals no acute abnormality. There is an old fracture of the head of the third metatarsal. There are extensive post traumatic and postsurgical changes of the ankle and hindfoot. Electronically Signed   By: David  [place] M.D.   On: 07/12/2016 12:50    Procedures Procedures (including critical care time)  SPLINT APPLICATION Date/Time: 2:18 PM Authorized by: Rhona Raider Consent: Verbal consent obtained. Risks and benefits: risks, benefits and alternatives were discussed Consent given by: patient Splint applied by: orthopedic technician Location details: R short leg Splint type: posterior short leg and sugar tong Supplies used: orthoglass Post-procedure: The splinted body part was neurovascularly unchanged following the procedure. Patient tolerance: Patient tolerated the procedure well with no immediate complications.     Medications Ordered in ED Medications  HYDROcodone-acetaminophen (NORCO/VICODIN) 5-325 MG per tablet 1 tablet (not administered)     Initial Impression / Assessment and Plan / ED Course  I have reviewed the triage vital signs and the nursing notes.  Pertinent labs & imaging results that were  available during my care of the patient were reviewed by me and considered in my medical decision making (see chart for details).     76 y.o. female here with R leg pain x4 days after a mechanical fall. On exam, swelling and bruising to mid-calf area, bruising to R big toe, skin intact, moderate tenderness to mid-calf area, +crepitus. Distal pulses intact, sensation grossly intact at baseline (chronic periph neuropathy). Xrays of tib/fib reveal spiral fx of proximal and mid-shaft of tibia, no other acute findings. Pt declines wanting anything for pain. Will consult Dr. Lajoyce Corners who recently operated on her leg, and reassess shortly. Will hold off on labs until we're sure whether she needs any surgical repair today.  1:48 PM Dr. Lajoyce Corners returning page, would like for her to have short leg splint (sugartong and posterior) placed, instruct to elevate, and f/up with him this week for ongoing management. Pt has pain meds at home to use if she needs them, advised use of additional ibuprofen as needed as well. RICE discussed. She has home walker and scooter/wheel chair to use, advised nonweightbearing status. I explained the diagnosis and have given explicit precautions to return to the ER including for any other new or worsening symptoms. The patient understands and accepts the medical plan as it's been dictated and I have answered their questions. Discharge instructions concerning home care and prescriptions have been given. The patient is STABLE and is discharged to home in good condition.    Final Clinical Impressions(s) / ED Diagnoses   Final diagnoses:  Closed nondisplaced spiral fracture of shaft of right tibia, initial encounter  Right leg  pain    New Prescriptions New Prescriptions   No medications on [address] [place], [place] 07/12/16 1418    Lorre Nick, MD 07/12/16 1420

## 2016-07-12 NOTE — ED Notes (Signed)
Orthro placing cast

## 2016-07-12 NOTE — ED Notes (Signed)
Pt is alert and oriented x 4 and is verbally responsive. Pt is accompanied by spouse. Pt denies pain at this time, and right leg has bright external rotation. With some bruising noted on right big toe.

## 2016-07-12 NOTE — ED Triage Notes (Signed)
Per [facility] pt from home. On arrival pt was sitting upright on edge of bed stating she fell Thursday while transferring herself from her wheelchair to her bed. Denied hitting head or LOC. C/o right lower leg pain. Minor bruising noted to pts right big toe.

## 2016-07-12 NOTE — Discharge Instructions (Signed)
Wear ankle splint at all times until you've seen Dr. Lajoyce Corners. Use your home walker/wheelchair/scooter for all weight bearing activities. Ice and elevate ankle throughout the day, using ice pack for no more than 20 minutes every hour.  Alternate between tylenol and motrin as needed for pain, or use your home pain medications for pain relief. Do not drive or operate machinery with home pain medication use. Call Dr. Lajoyce Corners today to schedule an appointment for this week for ongoing management of your leg fracture. Return to the ER for changes or worsening symptoms.

## 2016-07-15 ENCOUNTER — Ambulatory Visit (INDEPENDENT_AMBULATORY_CARE_PROVIDER_SITE_OTHER): Payer: Medicare Other | Admitting: Orthopedic Surgery

## 2016-07-15 ENCOUNTER — Encounter (INDEPENDENT_AMBULATORY_CARE_PROVIDER_SITE_OTHER): Payer: Self-pay | Admitting: Orthopedic Surgery

## 2016-07-15 VITALS — Ht 59.0 in | Wt 185.0 lb

## 2016-07-15 DIAGNOSIS — S82201A Unspecified fracture of shaft of right tibia, initial encounter for closed fracture: Secondary | ICD-10-CM | POA: Diagnosis not present

## 2016-07-15 DIAGNOSIS — S82401A Unspecified fracture of shaft of right fibula, initial encounter for closed fracture: Secondary | ICD-10-CM | POA: Diagnosis not present

## 2016-07-15 NOTE — Progress Notes (Signed)
Office Visit Note   Patient: Andrea Yu           Date of Birth: May 26, 1940           MRN: [id number] Visit Date: 07/15/2016              Requested by: Jarome Matin, MD [address] [place], [place] [address] PCP: Jarome Matin, MD  Chief Complaint  Patient presents with  . Right Leg - Follow-up    [facility] ED hospital follow up 07/12/16, DOI 07/08/16 mechanical fall. Spiral fracture midshaft proximal tibia.       HPI: Patient is a 76 year old woman with multiple medical problems. She states that on 07/08/2016 she fell at home sustaining a spiral fracture mid shaft of the tibia. Patient went to was a little emergency room she was unable to get up on her own and had to be transferred by EMS. She has been in a splint nonweightbearing in a wheelchair. Patient states she's had an ulcer on the right heel prior to the injury. Patient is status post computer navigation of the total knee and status post tibial calcaneal fusion on the right side.  Assessment & Plan: Visit Diagnoses:  1. Tibia/fibula fracture, right, closed, initial encounter     Plan: Due to patient's severe peripheral vascular disease and multiple medical problems with the fracture well aligned we will place her in a fracture boot so she can monitor the skin. Discussed the risk of a cast that she can get skin breakdown under the cast. She will be strict nonweightbearing on the right she will wear the fracture boot at all times she may take it off to cleanse the leg and to evaluate the skin but otherwise the boot should be on at all times.  2 view radiographs of the right tibia at follow-up.  Follow-Up Instructions: Return in about 2 weeks (around 07/29/2016).   Ortho Exam  Patient is alert, oriented, no adenopathy, well-dressed, normal affect, normal respiratory effort. Examination patient has a palpable dorsalis pedis pulse she has venous stasis changes there is a decubitus heel ulcer on the right. Her leg has no  varus or valgus flexion or extension deformity. Radiographs are reviewed which shows a spiral fracture of the midshaft of the tibia this appears to be in the region where patient had 2 pins placed for computer navigation of her total knee arthroplasty this may have been a contributing factor. Patient has severe calcification of the popliteal vessels extending down to the anterior and posterior branches. There is no skin breakdown and no cellulitis no tenting of the skin at the fracture site.  Imaging: No results found.  Labs: Lab Results  Component Value Date   REPTSTATUS 11/07/2012 FINAL 11/06/2012   CULT NO GROWTH Performed at [facility] 11/06/2012    Orders:  No orders of the defined types were placed in this encounter.  No orders of the defined types were placed in this encounter.    Procedures: No procedures performed  Clinical Data: No additional findings.  ROS:  All other systems negative, except as noted in the HPI. Review of Systems  Objective: Vital Signs: Ht 4\' 11"  (1.499 m)   Wt 185 lb (83.9 kg)   BMI 37.37 kg/m   Specialty Comments:  No specialty comments available.  PMFS History: Patient Active Problem List   Diagnosis Date Noted  . Decubitus ulcer of heel, stage 2 05/13/2016  . 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 Subacute osteomyelitis, right ankle and foot (HCC)   . SAH (subarachnoid hemorrhage) (HCC) 09/14/2014  . A-fib (HCC) 09/14/2014  . Hypertension 09/14/2014  . Laceration of eyebrow, left 09/14/2014  . Fall 09/14/2014  . Gastritis, acute 11/11/2012  . Cellulitis of leg, right 11/11/2012  . Obesity (BMI 30-39.9)- suspected sleep apnea, declines sleep study 11/10/2012  . Psoriatic arthritis (HCC) 11/06/2012  . Iron deficiency anemia- transfused this admission 11/06/2012  . Depression 11/06/2012  . Peripheral neuropathy  11/06/2012  . Shock circulatory on admission 11/06/12 11/06/2012  . Systemic inflammatory response syndrome (SIRS) (HCC) 11/06/2012  . Nausea and vomiting 08/09/2012  . Adrenal insufficiency (HCC) 08/09/2012  . PAF (paroxysmal atrial fibrillation) (HCC) 08/09/2012  . Hypotension 08/09/2012  . Dehydration 08/09/2012   Past Medical History:  Diagnosis Date  . A-fib (HCC)    "just after knee 2nd OR" (11/07/2012)  . Anemia   . Arthritis    RA , SACROTIC ARTHRITIS  . Complication of anesthesia    found to be in afib when she woke up  . Dysrhythmia   . GERD (gastroesophageal reflux disease)   . Head injury, closed, with concussion   . History of blood transfusion    "after 1 of my knee ORs and today" (11/07/2012)  . Hypertension   . Peripheral neuropathy    "from back OR in 2005; in hands and feet" (11/07/2012)  . Psoriatic arthritis (HCC)    on Prednisone Rx    Family History  Problem Relation Age of Onset  . Heart attack Father        1951-deceased   . Liver disease Mother   . Aneurysm Son 65       Deceased     Past Surgical History:  Procedure Laterality Date  . ANKLE FUSION Right 01/07/2016   Procedure: Right Tibiocalcaneal Fusion;  Surgeon: Nadara Mustard, MD;  Location: [facility] OR;  Service: Orthopedics;  Laterality: Right;  . BACK SURGERY    . CATARACT EXTRACTION W/ INTRAOCULAR LENS  IMPLANT, BILATERAL Bilateral 2007  . CHOLECYSTECTOMY  2000's  . COLONOSCOPY N/A 11/10/2012   Procedure: COLONOSCOPY;  Surgeon: Theda Belfast, MD;  Location: [facility] ENDOSCOPY;  Service: Endoscopy;  Laterality: N/A;  . ESOPHAGOGASTRODUODENOSCOPY N/A 11/10/2012   Procedure: ESOPHAGOGASTRODUODENOSCOPY (EGD);  Surgeon: Theda Belfast, MD;  Location: [facility] ENDOSCOPY;  Service: Endoscopy;  Laterality: N/A;  . EYE SURGERY    . JOINT REPLACEMENT    . POSTERIOR LUMBAR FUSION  2005   "got a rod and 4 pins in there" (11/07/2012)  . REPLACEMENT TOTAL KNEE BILATERAL Bilateral 2007  . TRANSTHORACIC  ECHOCARDIOGRAM  05/24/2006  . TUBAL LIGATION  1974  . VAGINAL HYSTERECTOMY  1983   Social History   Occupational History  . Not on file.   Social History Main Topics  . Smoking status: Former Smoker    Packs/day: 0.50    Years: 30.00    Types: Cigarettes    Quit date: 08/09/1981  . Smokeless tobacco: Never Used  . Alcohol use No  . Drug use: No  . Sexual activity: Not Currently

## 2016-07-29 ENCOUNTER — Ambulatory Visit (INDEPENDENT_AMBULATORY_CARE_PROVIDER_SITE_OTHER): Payer: Medicare Other | Admitting: Family

## 2016-07-29 ENCOUNTER — Encounter (INDEPENDENT_AMBULATORY_CARE_PROVIDER_SITE_OTHER): Payer: Self-pay | Admitting: Orthopedic Surgery

## 2016-07-29 ENCOUNTER — Ambulatory Visit (INDEPENDENT_AMBULATORY_CARE_PROVIDER_SITE_OTHER): Payer: Medicare Other

## 2016-07-29 VITALS — Ht 59.0 in | Wt 185.0 lb

## 2016-07-29 DIAGNOSIS — M79661 Pain in right lower leg: Secondary | ICD-10-CM

## 2016-07-30 ENCOUNTER — Other Ambulatory Visit: Payer: Self-pay | Admitting: Cardiovascular Disease

## 2016-07-30 NOTE — Progress Notes (Signed)
Office Visit Note   Patient: Andrea Yu           Date of Birth: 1940/11/16           MRN: [id number] Visit Date: 07/29/2016              Requested by: Jarome Matin, MD [address] [place], [place] [address] PCP: Jarome Matin, MD  Chief Complaint  Patient presents with  . Right Leg - Follow-up    ER 07/12/16 after fall 07/08/16 hx of tib-cal fusion 7 months ago      HPI: Patient is a 76 year old woman with multiple medical problems seen today in follow up, is about 2 weeks status post right tibial spiral fracture.   Patient states she's had an ulcer on the right heel prior to the injury.  Has been in a notorized scooter nonweight bearing. States has removed the boot twice to cleanse leg.   Assessment & Plan: Visit Diagnoses:  1. Pain in right lower leg     Plan: She will continue to be strict nonweightbearing on the right she will wear the fracture boot at all times she may take it off to cleanse the leg and to evaluate the skin but otherwise the boot should be on at all times.  2 view radiographs of the right tibia at follow-up.  Follow-Up Instructions: Return in about 2 weeks (around 08/12/2016).   Ortho Exam   Patient is alert, oriented, no adenopathy, well-dressed, normal affect, normal respiratory effort. Examination patient has a palpable dorsalis pedis pulse she has venous stasis changes there is a decubitus heel ulcer on the right. Her leg has no varus or valgus flexion or extension deformity. There is no skin breakdown and no cellulitis no tenting of the skin at the fracture site.  Imaging: Xr Tibia/fibula Right  Result Date: 07/30/2016 Radiographs of the right tibia and fibula show the spiral fracture of the proximal and midshaft of the right tibia with interval callus formation. The total knee arthroplasty is well aligned. No complicating features of the intramedullary rod and fusion screws across the right ankle and distal tibia. Post traumatic  degenerative changes to the right ankle.    Labs: Lab Results  Component Value Date   REPTSTATUS 11/07/2012 FINAL 11/06/2012   CULT NO GROWTH Performed at [facility] 11/06/2012    Orders:  Orders Placed This Encounter  Procedures  . XR Tibia/Fibula Right   No orders of the defined types were placed in this encounter.    Procedures: No procedures performed   Clinical Data: No additional findings.   ROS:  All other systems negative, except as noted in the HPI. Review of Systems   Objective: Vital Signs: Ht 4\' 11"  (1.499 m)   Wt 185 lb (83.9 kg)   BMI 37.37 kg/m   Specialty Comments:  No specialty comments available.  PMFS History: Patient Active Problem List   Diagnosis Date Noted  . Decubitus ulcer of heel, stage 2 05/13/2016  . 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 Subacute osteomyelitis, right ankle and foot (HCC)   . SAH (subarachnoid hemorrhage) (HCC) 09/14/2014  . A-fib (HCC) 09/14/2014  . Hypertension 09/14/2014  . Laceration of eyebrow, left 09/14/2014  . Fall 09/14/2014  . Gastritis, acute 11/11/2012  . Cellulitis of leg, right 11/11/2012  . Obesity (BMI 30-39.9)- suspected sleep apnea, declines sleep study 11/10/2012  .  Psoriatic arthritis (HCC) 11/06/2012  . Iron deficiency anemia- transfused this admission 11/06/2012  . Depression 11/06/2012  . Peripheral neuropathy 11/06/2012  . Shock circulatory on admission 11/06/12 11/06/2012  . Systemic inflammatory response syndrome (SIRS) (HCC) 11/06/2012  . Nausea and vomiting 08/09/2012  . Adrenal insufficiency (HCC) 08/09/2012  . PAF (paroxysmal atrial fibrillation) (HCC) 08/09/2012  . Hypotension 08/09/2012  . Dehydration 08/09/2012   Past Medical History:  Diagnosis Date  . A-fib (HCC)    "just after knee 2nd OR" (11/07/2012)  . Anemia   . Arthritis    RA , SACROTIC  ARTHRITIS  . Complication of anesthesia    found to be in afib when she woke up  . Dysrhythmia   . GERD (gastroesophageal reflux disease)   . Head injury, closed, with concussion   . History of blood transfusion    "after 1 of my knee ORs and today" (11/07/2012)  . Hypertension   . Peripheral neuropathy    "from back OR in 2005; in hands and feet" (11/07/2012)  . Psoriatic arthritis (HCC)    on Prednisone Rx    Family History  Problem Relation Age of Onset  . Heart attack Father        1951-deceased   . Liver disease Mother   . Aneurysm Son 45       Deceased     Past Surgical History:  Procedure Laterality Date  . ANKLE FUSION Right 01/07/2016   Procedure: Right Tibiocalcaneal Fusion;  Surgeon: Nadara Mustard, MD;  Location: [facility] OR;  Service: Orthopedics;  Laterality: Right;  . BACK SURGERY    . CATARACT EXTRACTION W/ INTRAOCULAR LENS  IMPLANT, BILATERAL Bilateral 2007  . CHOLECYSTECTOMY  2000's  . COLONOSCOPY N/A 11/10/2012   Procedure: COLONOSCOPY;  Surgeon: Theda Belfast, MD;  Location: [facility] ENDOSCOPY;  Service: Endoscopy;  Laterality: N/A;  . ESOPHAGOGASTRODUODENOSCOPY N/A 11/10/2012   Procedure: ESOPHAGOGASTRODUODENOSCOPY (EGD);  Surgeon: Theda Belfast, MD;  Location: [facility] ENDOSCOPY;  Service: Endoscopy;  Laterality: N/A;  . EYE SURGERY    . JOINT REPLACEMENT    . POSTERIOR LUMBAR FUSION  2005   "got a rod and 4 pins in there" (11/07/2012)  . REPLACEMENT TOTAL KNEE BILATERAL Bilateral 2007  . TRANSTHORACIC ECHOCARDIOGRAM  05/24/2006  . TUBAL LIGATION  1974  . VAGINAL HYSTERECTOMY  1983   Social History   Occupational History  . Not on file.   Social History Main Topics  . Smoking status: Former Smoker    Packs/day: 0.50    Years: 30.00    Types: Cigarettes    Quit date: 08/09/1981  . Smokeless tobacco: Never Used  . Alcohol use No  . Drug use: No  . Sexual activity: Not Currently

## 2016-08-02 ENCOUNTER — Ambulatory Visit (INDEPENDENT_AMBULATORY_CARE_PROVIDER_SITE_OTHER): Payer: Medicare Other | Admitting: Orthopedic Surgery

## 2016-08-09 ENCOUNTER — Ambulatory Visit (INDEPENDENT_AMBULATORY_CARE_PROVIDER_SITE_OTHER): Payer: Medicare Other | Admitting: Orthopedic Surgery

## 2016-08-12 ENCOUNTER — Ambulatory Visit (INDEPENDENT_AMBULATORY_CARE_PROVIDER_SITE_OTHER): Payer: Medicare Other

## 2016-08-12 ENCOUNTER — Ambulatory Visit (INDEPENDENT_AMBULATORY_CARE_PROVIDER_SITE_OTHER): Payer: Medicare Other | Admitting: Family

## 2016-08-12 ENCOUNTER — Encounter (INDEPENDENT_AMBULATORY_CARE_PROVIDER_SITE_OTHER): Payer: Self-pay | Admitting: Family

## 2016-08-12 VITALS — Ht 59.0 in | Wt 185.0 lb

## 2016-08-12 DIAGNOSIS — S82241D Displaced spiral fracture of shaft of right tibia, subsequent encounter for closed fracture with routine healing: Secondary | ICD-10-CM | POA: Diagnosis not present

## 2016-08-12 NOTE — Progress Notes (Signed)
Office Visit Note   Patient: Andrea Yu           Date of Birth: May 11, 1940           MRN: [id number] Visit Date: 08/12/2016              Requested by: Jarome Matin, MD [address] [place], [place] [address] PCP: Jarome Matin, MD  Chief Complaint  Patient presents with  . Right Leg - Fracture      HPI: Patient is a 76 year old woman with multiple medical problems seen today in follow up, is about 4 weeks status post right tibial spiral fracture.   Has been in a motorized scooter nonweight bearing.    Assessment & Plan: Visit Diagnoses:  1. Closed displaced spiral fracture of shaft of right tibia with routine healing, subsequent encounter     Plan: She will continue to be strict nonweightbearing on the right she will wear the fracture boot at all times she may take it off to cleanse the leg and to evaluate the skin but otherwise the boot should be on at all times.  2 view radiographs of the right tibia at follow-up.  Follow-Up Instructions: Return in about 4 weeks (around 09/09/2016).   Ortho Exam  Patient is alert, oriented, no adenopathy, well-dressed, normal affect, normal respiratory effort. Examination patient has a palpable dorsalis pedis pulse she has venous stasis changes. Her leg has no varus or valgus flexion or extension deformity. There is no skin breakdown and no cellulitis no tenting of the skin at the fracture site.  Imaging: No results found.  Labs: Lab Results  Component Value Date   REPTSTATUS 11/07/2012 FINAL 11/06/2012   CULT NO GROWTH Performed at [facility] 11/06/2012    Orders:  Orders Placed This Encounter  Procedures  . XR Tibia/Fibula Right   No orders of the defined types were placed in this encounter.    Procedures: No procedures performed  Clinical Data: No additional findings.  ROS:  All other systems negative, except as noted in the HPI. Review of Systems  Constitutional: Negative for chills and  fever.  Musculoskeletal: Positive for arthralgias.    Objective: Vital Signs: Ht 4\' 11"  (1.499 m)   Wt 185 lb (83.9 kg)   BMI 37.37 kg/m   Specialty Comments:  No specialty comments available.  PMFS History: Patient Active Problem List   Diagnosis Date Noted  . Decubitus ulcer of heel, stage 2 05/13/2016  . 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 Subacute osteomyelitis, right ankle and foot (HCC)   . SAH (subarachnoid hemorrhage) (HCC) 09/14/2014  . A-fib (HCC) 09/14/2014  . Hypertension 09/14/2014  . Laceration of eyebrow, left 09/14/2014  . Fall 09/14/2014  . Gastritis, acute 11/11/2012  . Cellulitis of leg, right 11/11/2012  . Obesity (BMI 30-39.9)- suspected sleep apnea, declines sleep study 11/10/2012  . Psoriatic arthritis (HCC) 11/06/2012  . Iron deficiency anemia- transfused this admission 11/06/2012  . Depression 11/06/2012  . Peripheral neuropathy 11/06/2012  . Shock circulatory on admission 11/06/12 11/06/2012  . Systemic inflammatory response syndrome (SIRS) (HCC) 11/06/2012  . Nausea and vomiting 08/09/2012  . Adrenal insufficiency (HCC) 08/09/2012  . PAF (paroxysmal atrial fibrillation) (HCC) 08/09/2012  . Hypotension 08/09/2012  . Dehydration 08/09/2012   Past Medical History:  Diagnosis Date  . A-fib (HCC)    "just after knee 2nd OR" (11/07/2012)  .  Anemia   . Arthritis    RA , SACROTIC ARTHRITIS  . Complication of anesthesia    found to be in afib when she woke up  . Dysrhythmia   . GERD (gastroesophageal reflux disease)   . Head injury, closed, with concussion   . History of blood transfusion    "after 1 of my knee ORs and today" (11/07/2012)  . Hypertension   . Peripheral neuropathy    "from back OR in 2005; in hands and feet" (11/07/2012)  . Psoriatic arthritis (HCC)    on Prednisone Rx    Family History  Problem Relation Age of  Onset  . Heart attack Father        1951-deceased   . Liver disease Mother   . Aneurysm Son 67       Deceased     Past Surgical History:  Procedure Laterality Date  . ANKLE FUSION Right 01/07/2016   Procedure: Right Tibiocalcaneal Fusion;  Surgeon: Nadara Mustard, MD;  Location: [facility] OR;  Service: Orthopedics;  Laterality: Right;  . BACK SURGERY    . CATARACT EXTRACTION W/ INTRAOCULAR LENS  IMPLANT, BILATERAL Bilateral 2007  . CHOLECYSTECTOMY  2000's  . COLONOSCOPY N/A 11/10/2012   Procedure: COLONOSCOPY;  Surgeon: Theda Belfast, MD;  Location[facility] ENDOSCOPY;  Service: Endoscopy;  Laterality: N/A;  . ESOPHAGOGASTRODUODENOSCOPY N/A 11/10/2012   Procedure: ESOPHAGOGASTRODUODENOSCOPY (EGD);  Surgeon: Theda Belfast, MD;  Location: [facility] ENDOSCOPY;  Service: Endoscopy;  Laterality: N/A;  . EYE SURGERY    . JOINT REPLACEMENT    . POSTERIOR LUMBAR FUSION  2005   "got a rod and 4 pins in there" (11/07/2012)  . REPLACEMENT TOTAL KNEE BILATERAL Bilateral 2007  . TRANSTHORACIC ECHOCARDIOGRAM  05/24/2006  . TUBAL LIGATION  1974  . VAGINAL HYSTERECTOMY  1983   Social History   Occupational History  . Not on file.   Social History Main Topics  . Smoking status: Former Smoker    Packs/day: 0.50    Years: 30.00    Types: Cigarettes    Quit date: 08/09/1981  . Smokeless tobacco: Never Used  . Alcohol use No  . Drug use: No  . Sexual activity: Not Currently

## 2016-09-09 ENCOUNTER — Ambulatory Visit (INDEPENDENT_AMBULATORY_CARE_PROVIDER_SITE_OTHER): Payer: Medicare Other

## 2016-09-09 ENCOUNTER — Ambulatory Visit (INDEPENDENT_AMBULATORY_CARE_PROVIDER_SITE_OTHER): Payer: Medicare Other | Admitting: Family

## 2016-09-09 DIAGNOSIS — S82241G Displaced spiral fracture of shaft of right tibia, subsequent encounter for closed fracture with delayed healing: Secondary | ICD-10-CM

## 2016-09-09 NOTE — Progress Notes (Signed)
Office Visit Note   Patient: Andrea Yu           Date of Birth: 10-18-1940           MRN: [id number] Visit Date: 09/09/2016              Requested by: Jarome Matin, MD [address] [place], [place] [address] PCP: Jarome Matin, MD  No chief complaint on file.     HPI: Patient is a 76 year old woman with multiple medical problems seen today in follow up, is about 8 weeks status post right tibial spiral fracture.   Has been in a motorized scooter nonweight bearing. Is eager to get back on her feet. Wonders if she will really need a double upright brace once the fracture has healed.   Assessment & Plan: Visit Diagnoses:  1. Closed displaced spiral fracture of shaft of right tibia with delayed healing, subsequent encounter     Plan: She will continue the fracture boot. May begin weight bearing with her walker. Will go ahead and have the DUB fabricated.   2 view radiographs of the right tibia at follow-up.  Follow-Up Instructions: No Follow-up on file.   Ortho Exam  Patient is alert, oriented, no adenopathy, well-dressed, normal affect, normal respiratory effort. Examination patient has a palpable dorsalis pedis pulse she has venous stasis changes. There is no skin breakdown and no cellulitis no tenting of the skin at the fracture site.  Imaging: No results found.  Labs: Lab Results  Component Value Date   REPTSTATUS 11/07/2012 FINAL 11/06/2012   CULT NO GROWTH Performed at [facility] 11/06/2012    Orders:  Orders Placed This Encounter  Procedures  . XR Tibia/Fibula Right   No orders of the defined types were placed in this encounter.    Procedures: No procedures performed  Clinical Data: No additional findings.  ROS:  All other systems negative, except as noted in the HPI. Review of Systems  Constitutional: Negative for chills and fever.  Musculoskeletal: Positive for arthralgias.    Objective: Vital Signs: There were no  vitals taken for this visit.  Specialty Comments:  No specialty comments available.  PMFS History: Patient Active Problem List   Diagnosis Date Noted  . Decubitus ulcer of heel, stage 2 05/13/2016  . 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 Subacute osteomyelitis, right ankle and foot (HCC)   . SAH (subarachnoid hemorrhage) (HCC) 09/14/2014  . A-fib (HCC) 09/14/2014  . Hypertension 09/14/2014  . Laceration of eyebrow, left 09/14/2014  . Fall 09/14/2014  . Gastritis, acute 11/11/2012  . Cellulitis of leg, right 11/11/2012  . Obesity (BMI 30-39.9)- suspected sleep apnea, declines sleep study 11/10/2012  . Psoriatic arthritis (HCC) 11/06/2012  . Iron deficiency anemia- transfused this admission 11/06/2012  . Depression 11/06/2012  . Peripheral neuropathy 11/06/2012  . Shock circulatory on admission 11/06/12 11/06/2012  . Systemic inflammatory response syndrome (SIRS) (HCC) 11/06/2012  . Nausea and vomiting 08/09/2012  . Adrenal insufficiency (HCC) 08/09/2012  . PAF (paroxysmal atrial fibrillation) (HCC) 08/09/2012  . Hypotension 08/09/2012  . Dehydration 08/09/2012   Past Medical History:  Diagnosis Date  . A-fib (HCC)    "just after knee 2nd OR" (11/07/2012)  . Anemia   . Arthritis    RA , SACROTIC ARTHRITIS  . Complication of anesthesia    found to be in afib when she woke up  .  Dysrhythmia   . GERD (gastroesophageal reflux disease)   . Head injury, closed, with concussion   . History of blood transfusion    "after 1 of my knee ORs and today" (11/07/2012)  . Hypertension   . Peripheral neuropathy    "from back OR in 2005; in hands and feet" (11/07/2012)  . Psoriatic arthritis (HCC)    on Prednisone Rx    Family History  Problem Relation Age of Onset  . Heart attack Father        1951-deceased   . Liver disease Mother   . Aneurysm Son 46       Deceased       Past Surgical History:  Procedure Laterality Date  . ANKLE FUSION Right 01/07/2016   Procedure: Right Tibiocalcaneal Fusion;  Surgeon: Nadara Mustard, MD;  Location: [facility] OR;  Service: Orthopedics;  Laterality: Right;  . BACK SURGERY    . CATARACT EXTRACTION W/ INTRAOCULAR LENS  IMPLANT, BILATERAL Bilateral 2007  . CHOLECYSTECTOMY  2000's  . COLONOSCOPY N/A 11/10/2012   Procedure: COLONOSCOPY;  Surgeon: Theda Belfast, MD;  Location: [facility] ENDOSCOPY;  Service: Endoscopy;  Laterality: N/A;  . ESOPHAGOGASTRODUODENOSCOPY N/A 11/10/2012   Procedure: ESOPHAGOGASTRODUODENOSCOPY (EGD);  Surgeon: Theda Belfast, MD;  Location: [facility] ENDOSCOPY;  Service: Endoscopy;  Laterality: N/A;  . EYE SURGERY    . JOINT REPLACEMENT    . POSTERIOR LUMBAR FUSION  2005   "got a rod and 4 pins in there" (11/07/2012)  . REPLACEMENT TOTAL KNEE BILATERAL Bilateral 2007  . TRANSTHORACIC ECHOCARDIOGRAM  05/24/2006  . TUBAL LIGATION  1974  . VAGINAL HYSTERECTOMY  1983   Social History   Occupational History  . Not on file.   Social History Main Topics  . Smoking status: Former Smoker    Packs/day: 0.50    Years: 30.00    Types: Cigarettes    Quit date: 08/09/1981  . Smokeless tobacco: Never Used  . Alcohol use No  . Drug use: No  . Sexual activity: Not Currently

## 2016-09-20 ENCOUNTER — Other Ambulatory Visit: Payer: Self-pay | Admitting: *Deleted

## 2016-09-20 MED ORDER — FUROSEMIDE 40 MG PO TABS: 80.0000 mg | ORAL_TABLET | Freq: Every day | ORAL | 0 refills | Status: DC

## 2016-10-06 ENCOUNTER — Ambulatory Visit (INDEPENDENT_AMBULATORY_CARE_PROVIDER_SITE_OTHER): Payer: Medicare Other | Admitting: Family

## 2016-10-07 ENCOUNTER — Ambulatory Visit (INDEPENDENT_AMBULATORY_CARE_PROVIDER_SITE_OTHER): Payer: Medicare Other | Admitting: Orthopedic Surgery

## 2016-10-07 ENCOUNTER — Ambulatory Visit (INDEPENDENT_AMBULATORY_CARE_PROVIDER_SITE_OTHER): Payer: Medicare Other

## 2016-10-07 DIAGNOSIS — M79604 Pain in right leg: Secondary | ICD-10-CM | POA: Diagnosis not present

## 2016-10-07 DIAGNOSIS — S82241G Displaced spiral fracture of shaft of right tibia, subsequent encounter for closed fracture with delayed healing: Secondary | ICD-10-CM

## 2016-10-07 NOTE — Progress Notes (Signed)
Office Visit Note   Patient: Andrea Yu           Date of Birth: 01/17/1941           MRN: [id number] Visit Date: 10/07/2016              Requested by: Jarome Matin, MD [address] [place], [place] [address] PCP: Jarome Matin, MD  Chief Complaint  Patient presents with  . Right Leg - Pain, Follow-up      HPI: Patient is status post a closed nondisplaced fracture of the right tibia. Patient is also status post tibial calcaneal fusion with intramedullary nail as well as total knee arthroplasty on the same side.  Assessment & Plan: Visit Diagnoses:  1. Pain in right leg   2. Closed displaced spiral fracture of shaft of right tibia with delayed healing, subsequent encounter     Plan: Continue with the fracture boot weightbearing as tolerated follow-up in 4 weeks with repeat 2 view radiographs of the right tibia at which time I anticipate she can be advanced to regular shoewear.  Follow-Up Instructions: Return in about 4 weeks (around 11/04/2016).   Ortho Exam  Patient is alert, oriented, no adenopathy, well-dressed, normal affect, normal respiratory effort. Patient currently ambulates in a motorized scooter. She states that she is feeling much better with ambulation using a walker slowly progressing her activities. She has minimal tenderness to palpation at the fracture site there is palpable callus the skin is intact there are no rotational or angular deformities clinically.  Imaging: Xr Tibia/fibula Right  Result Date: 10/07/2016 2 view radiographs of the right tibia shows callus formation at the fracture site no loss of reduction stable internal fixation of the tibial calcaneal fusion and stable alignment of the total knee arthroplasty. There calcification of the blood vessels.  No images are attached to the encounter.  Labs: Lab Results  Component Value Date   REPTSTATUS 11/07/2012 FINAL 11/06/2012   CULT NO GROWTH Performed at [facility]  11/06/2012    Orders:  Orders Placed This Encounter  Procedures  . XR Tibia/Fibula Right   No orders of the defined types were placed in this encounter.    Procedures: No procedures performed  Clinical Data: No additional findings.  ROS:  All other systems negative, except as noted in the HPI. Review of Systems  Objective: Vital Signs: There were no vitals taken for this visit.  Specialty Comments:  No specialty comments available.  PMFS History: Patient Active Problem List   Diagnosis Date Noted  . Decubitus ulcer of heel, stage 2 05/13/2016  . 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 Subacute osteomyelitis, right ankle and foot (HCC)   . SAH (subarachnoid hemorrhage) (HCC) 09/14/2014  . A-fib (HCC) 09/14/2014  . Hypertension 09/14/2014  . Laceration of eyebrow, left 09/14/2014  . Fall 09/14/2014  . Gastritis, acute 11/11/2012  . Cellulitis of leg, right 11/11/2012  . Obesity (BMI 30-39.9)- suspected sleep apnea, declines sleep study 11/10/2012  . Psoriatic arthritis (HCC) 11/06/2012  . Iron deficiency anemia- transfused this admission 11/06/2012  . Depression 11/06/2012  . Peripheral neuropathy 11/06/2012  . Shock circulatory on admission 11/06/12 11/06/2012  . Systemic inflammatory response syndrome (SIRS) (HCC) 11/06/2012  . Nausea and vomiting 08/09/2012  . Adrenal insufficiency (HCC) 08/09/2012  . PAF (paroxysmal atrial fibrillation) (HCC) 08/09/2012  . Hypotension 08/09/2012  . Dehydration 08/09/2012  Past Medical History:  Diagnosis Date  . A-fib (HCC)    "just after knee 2nd OR" (11/07/2012)  . Anemia   . Arthritis    RA , SACROTIC ARTHRITIS  . Complication of anesthesia    found to be in afib when she woke up  . Dysrhythmia   . GERD (gastroesophageal reflux disease)   . Head injury, closed, with concussion   . History of blood  transfusion    "after 1 of my knee ORs and today" (11/07/2012)  . Hypertension   . Peripheral neuropathy    "from back OR in 2005; in hands and feet" (11/07/2012)  . Psoriatic arthritis (HCC)    on Prednisone Rx    Family History  Problem Relation Age of Onset  . Heart attack Father        1951-deceased   . Liver disease Mother   . Aneurysm Son 12       Deceased     Past Surgical History:  Procedure Laterality Date  . ANKLE FUSION Right 01/07/2016   Procedure: Right Tibiocalcaneal Fusion;  Surgeon: Nadara Mustard, MD;  Location: [facility] OR;  Service: Orthopedics;  Laterality: Right;  . BACK SURGERY    . CATARACT EXTRACTION W/ INTRAOCULAR LENS  IMPLANT, BILATERAL Bilateral 2007  . CHOLECYSTECTOMY  2000's  . COLONOSCOPY N/A 11/10/2012   Procedure: COLONOSCOPY;  Surgeon: Theda Belfast, MD;  Location: [facility] ENDOSCOPY;  Service: Endoscopy;  Laterality: N/A;  . ESOPHAGOGASTRODUODENOSCOPY N/A 11/10/2012   Procedure: ESOPHAGOGASTRODUODENOSCOPY (EGD);  Surgeon: Theda Belfast, MD;  Location[facility] ENDOSCOPY;  Service: Endoscopy;  Laterality: N/A;  . EYE SURGERY    . JOINT REPLACEMENT    . POSTERIOR LUMBAR FUSION  2005   "got a rod and 4 pins in there" (11/07/2012)  . REPLACEMENT TOTAL KNEE BILATERAL Bilateral 2007  . TRANSTHORACIC ECHOCARDIOGRAM  05/24/2006  . TUBAL LIGATION  1974  . VAGINAL HYSTERECTOMY  1983   Social History   Occupational History  . Not on file.   Social History Main Topics  . Smoking status: Former Smoker    Packs/day: 0.50    Years: 30.00    Types: Cigarettes    Quit date: 08/09/1981  . Smokeless tobacco: Never Used  . Alcohol use No  . Drug use: No  . Sexual activity: Not Currently

## 2016-10-09 ENCOUNTER — Other Ambulatory Visit: Payer: Self-pay | Admitting: Cardiovascular Disease

## 2016-10-11 NOTE — Telephone Encounter (Signed)
Rx(s) sent to pharmacy electronically.  

## 2016-10-20 ENCOUNTER — Other Ambulatory Visit: Payer: Self-pay | Admitting: Cardiovascular Disease

## 2016-11-04 ENCOUNTER — Ambulatory Visit (INDEPENDENT_AMBULATORY_CARE_PROVIDER_SITE_OTHER): Payer: Medicare Other | Admitting: Orthopedic Surgery

## 2016-11-11 ENCOUNTER — Ambulatory Visit (INDEPENDENT_AMBULATORY_CARE_PROVIDER_SITE_OTHER): Payer: Medicare Other | Admitting: Family

## 2016-11-11 ENCOUNTER — Encounter (INDEPENDENT_AMBULATORY_CARE_PROVIDER_SITE_OTHER): Payer: Self-pay | Admitting: Orthopedic Surgery

## 2016-11-11 ENCOUNTER — Ambulatory Visit (INDEPENDENT_AMBULATORY_CARE_PROVIDER_SITE_OTHER): Payer: Medicare Other

## 2016-11-11 DIAGNOSIS — S82241G Displaced spiral fracture of shaft of right tibia, subsequent encounter for closed fracture with delayed healing: Secondary | ICD-10-CM | POA: Diagnosis not present

## 2016-11-11 DIAGNOSIS — M79604 Pain in right leg: Secondary | ICD-10-CM | POA: Diagnosis not present

## 2016-11-11 DIAGNOSIS — Z981 Arthrodesis status: Secondary | ICD-10-CM

## 2016-11-11 DIAGNOSIS — L97411 Non-pressure chronic ulcer of right heel and midfoot limited to breakdown of skin: Secondary | ICD-10-CM

## 2016-11-11 MED ORDER — SILVER SULFADIAZINE 1 % EX CREA: 1.0000 "application " | TOPICAL_CREAM | Freq: Every day | CUTANEOUS | 1 refills | Status: DC

## 2016-11-11 NOTE — Addendum Note (Signed)
Addended by: Barnie Del R on: 11/11/2016 01:34 PM   Modules accepted: Orders

## 2016-11-11 NOTE — Progress Notes (Signed)
Office Visit Note   Patient: Andrea Yu           Date of Birth: [birth year]           MRN: [id number] Visit Date: 11/11/2016              Requested by: Jarome Matin, MD [address] [place], [place] [address] PCP: Jarome Matin, MD  Chief Complaint  Patient presents with  . Right Leg - Follow-up      HPI: Patient is status post a closed nondisplaced fracture of the right tibia. Patient is also status post tibial calcaneal fusion with intramedullary nail as well as total knee arthroplasty on the same side. Has been full weight bearing some in CAM walker. Today is on motorized scooter. States has plantar heel ulcer which is painful with weight bearing. Has been unable to bear much weight due to heel pain.  Assessment & Plan: Visit Diagnoses:  1. Displaced spiral fracture of shaft of right tibia, subsequent encounter for closed fracture with delayed healing   2. Pain in right leg   3. 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 Status post ankle arthrodesis     Plan: Continue with the fracture boot. weightbearing as tolerated follow-up in 4 weeks with repeat 2 view radiographs of the right tibia at which time I anticipate she can be advanced to regular shoewear. Will begin silvadene dressing changes daily to right heel ulcer. Felt pressure relieving donut provided. Advised to minimize weight bearing due to heel ulcer.  Follow-Up Instructions: Return in about 4 weeks (around 12/09/2016).   Ortho Exam  Patient is alert, oriented, no adenopathy, well-dressed, normal affect, normal respiratory effort. Patient currently ambulates in a motorized scooter. She has minimal tenderness to palpation at the fracture site there is palpable callus the skin is intact there are no rotational or angular deformities clinically. Painful ulcer to plantar medial heel on right. This is 8 mm in diameter and 3 mm deep. Granulation in wound bed.  Scant serous drainage. No odor or surrounding erythema.   Imaging: Xr Tibia/fibula Right  Result Date: 11/11/2016 Radiographs of right tibia and fibula show interval callus formation of tibial spiral fracture. Stable internal fixation of the tibial calcaneal fusion and stable alignment of the total knee arthroplasty. Calcification of the blood vessels seen.  No images are attached to the encounter.  Labs: Lab Results  Component Value Date   REPTSTATUS 11/07/2012 FINAL 11/06/2012   CULT NO GROWTH Performed at [facility] 11/06/2012    Orders:  Orders Placed This Encounter  Procedures  . XR Tibia/Fibula Right   No orders of the defined types were placed in this encounter.    Procedures: No procedures performed  Clinical Data: No additional findings.  ROS:  All other systems negative, except as noted in the HPI. Review of Systems  Constitutional: Negative for chills and fever.  Musculoskeletal: Positive for arthralgias.  Skin: Positive for wound.    Objective: Vital Signs: There were no vitals taken for this visit.  Specialty Comments:  No specialty comments available.  PMFS History: Patient Active Problem List   Diagnosis Date Noted  . Displaced spiral fracture of shaft of right tibia, subsequent encounter for closed fracture with delayed healing 11/11/2016  . Decubitus ulcer of heel, stage 2 05/13/2016  . 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 Status post  ankle arthrodesis 01/07/2016  . Subacute osteomyelitis, right ankle and foot (HCC)   . SAH (subarachnoid hemorrhage) (HCC) 09/14/2014  . A-fib (HCC) 09/14/2014  . Hypertension 09/14/2014  . Laceration of eyebrow, left 09/14/2014  . Fall 09/14/2014  . Gastritis, acute 11/11/2012  . Cellulitis of leg, right 11/11/2012  . Obesity (BMI 30-39.9)- suspected sleep apnea, declines sleep study 11/10/2012  . Psoriatic arthritis (HCC)  11/06/2012  . Iron deficiency anemia- transfused this admission 11/06/2012  . Depression 11/06/2012  . Peripheral neuropathy 11/06/2012  . Shock circulatory on admission 11/06/12 11/06/2012  . Systemic inflammatory response syndrome (SIRS) (HCC) 11/06/2012  . Nausea and vomiting 08/09/2012  . Adrenal insufficiency (HCC) 08/09/2012  . PAF (paroxysmal atrial fibrillation) (HCC) 08/09/2012  . Hypotension 08/09/2012  . Dehydration 08/09/2012   Past Medical History:  Diagnosis Date  . A-fib (HCC)    "just after knee 2nd OR" (11/07/2012)  . Anemia   . Arthritis    RA , SACROTIC ARTHRITIS  . Complication of anesthesia    found to be in afib when she woke up  . Dysrhythmia   . GERD (gastroesophageal reflux disease)   . Head injury, closed, with concussion   . History of blood transfusion    "after 1 of my knee ORs and today" (11/07/2012)  . Hypertension   . Peripheral neuropathy    "from back OR in 2005; in hands and feet" (11/07/2012)  . Psoriatic arthritis (HCC)    on Prednisone Rx    Family History  Problem Relation Age of Onset  . Heart attack Father        1951-deceased   . Liver disease Mother   . Aneurysm Son 49       Deceased     Past Surgical History:  Procedure Laterality Date  . ANKLE FUSION Right 01/07/2016   Procedure: Right Tibiocalcaneal Fusion;  Surgeon: Nadara Mustard, MD;  Location: [facility] OR;  Service: Orthopedics;  Laterality: Right;  . BACK SURGERY    . CATARACT EXTRACTION W/ INTRAOCULAR LENS  IMPLANT, BILATERAL Bilateral 2007  . CHOLECYSTECTOMY  2000's  . COLONOSCOPY N/A 11/10/2012   Procedure: COLONOSCOPY;  Surgeon: Theda Belfast, MD;  Location: [facility] ENDOSCOPY;  Service: Endoscopy;  Laterality: N/A;  . ESOPHAGOGASTRODUODENOSCOPY N/A 11/10/2012   Procedure: ESOPHAGOGASTRODUODENOSCOPY (EGD);  Surgeon: Theda Belfast, MD;  Location: [facility] ENDOSCOPY;  Service: Endoscopy;  Laterality: N/A;  . EYE SURGERY    . JOINT REPLACEMENT    . POSTERIOR LUMBAR FUSION  2005     "got a rod and 4 pins in there" (11/07/2012)  . REPLACEMENT TOTAL KNEE BILATERAL Bilateral 2007  . TRANSTHORACIC ECHOCARDIOGRAM  05/24/2006  . TUBAL LIGATION  1974  . VAGINAL HYSTERECTOMY  1983   Social History   Occupational History  . Not on file.   Social History Main Topics  . Smoking status: Former Smoker    Packs/day: 0.50    Years: 30.00    Types: Cigarettes    Quit date: 08/09/1981  . Smokeless tobacco: Never Used  . Alcohol use No  . Drug use: No  . Sexual activity: Not Currently

## 2016-12-09 ENCOUNTER — Ambulatory Visit (INDEPENDENT_AMBULATORY_CARE_PROVIDER_SITE_OTHER): Payer: Medicare Other | Admitting: Family

## 2016-12-20 ENCOUNTER — Other Ambulatory Visit: Payer: Self-pay | Admitting: Cardiovascular Disease

## 2016-12-21 NOTE — Telephone Encounter (Signed)
REFILL 

## 2016-12-22 ENCOUNTER — Ambulatory Visit (INDEPENDENT_AMBULATORY_CARE_PROVIDER_SITE_OTHER): Payer: Medicare Other

## 2016-12-22 ENCOUNTER — Encounter (INDEPENDENT_AMBULATORY_CARE_PROVIDER_SITE_OTHER): Payer: Self-pay | Admitting: Family

## 2016-12-22 ENCOUNTER — Ambulatory Visit (INDEPENDENT_AMBULATORY_CARE_PROVIDER_SITE_OTHER): Payer: Medicare Other | Admitting: Family

## 2016-12-22 DIAGNOSIS — S82241G Displaced spiral fracture of shaft of right tibia, subsequent encounter for closed fracture with delayed healing: Secondary | ICD-10-CM

## 2016-12-22 DIAGNOSIS — L89612 Pressure ulcer of right heel, stage 2: Secondary | ICD-10-CM | POA: Diagnosis not present

## 2016-12-22 NOTE — Progress Notes (Signed)
Office Visit Note   Patient: Andrea Yu           Date of Birth: 06/17/1940           MRN: [id number] Visit Date: 12/22/2016              Requested by: Jarome Matin, MD [address] [place], [place] [address] PCP: Jarome Matin, MD  No chief complaint on file.     HPI: Patient is status post a closed nondisplaced fracture of the right tibia. Patient is also status post tibial calcaneal fusion with intramedullary nail as well as total knee arthroplasty on the same side. Has been full weight bearing some in CAM walker. Today is on motorized scooter. States has plantar heel ulcer which is painful with weight bearing. Has been unable to bear much weight due to heel pain. States pain to ulcerative area as well as deep heel pain has been worsening despite essentially being nonweight bearing on right.   Assessment & Plan: Visit Diagnoses:  1. Displaced spiral fracture of shaft of right tibia, subsequent encounter for closed fracture with delayed healing     Plan: Continue with the fracture boot for foot and ankle instability. Will get into DURB as soon as possible. Patient would like heel ulcer to heel before buying DURB.Marland Kitchen weightbearing as tolerated follow-up in 4 weeks. Will continue silvadene dressing changes daily to right heel ulcer. Felt pressure relieving donut provided. Advised to minimize weight bearing due to heel ulcer. Have ordered an MRI to rule out osteomyelitis right heel.   Follow-Up Instructions: No Follow-up on file.   Ortho Exam  Patient is alert, oriented, no adenopathy, well-dressed, normal affect, normal respiratory effort. Patient currently ambulates in a motorized scooter. She has minimal tenderness to palpation at the fracture site there is palpable callus the skin is intact there are no rotational or angular deformities clinically. Painful ulcer to plantar medial heel on right. This is 8 mm in diameter and 3 mm deep. Granulation in wound bed. Thin layer  of exudative tissue. Scant serous drainage. No odor or surrounding erythema.   Imaging: No results found. No images are attached to the encounter.  Labs: Lab Results  Component Value Date   REPTSTATUS 11/07/2012 FINAL 11/06/2012   CULT NO GROWTH Performed at [facility] 11/06/2012    Orders:  Orders Placed This Encounter  Procedures  . XR Tibia/Fibula Right   No orders of the defined types were placed in this encounter.    Procedures: No procedures performed  Clinical Data: No additional findings.  ROS:  All other systems negative, except as noted in the HPI. Review of Systems  Constitutional: Negative for chills and fever.  Musculoskeletal: Positive for arthralgias.  Skin: Positive for wound.    Objective: Vital Signs: There were no vitals taken for this visit.  Specialty Comments:  No specialty comments available.  PMFS History: Patient Active Problem List   Diagnosis Date Noted  . Displaced spiral fracture of shaft of right tibia, subsequent encounter for closed fracture with delayed healing 11/11/2016  . Decubitus ulcer of heel, stage 2 05/13/2016  . 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 Subacute osteomyelitis, right ankle and foot (HCC)   . SAH (subarachnoid hemorrhage) (HCC) 09/14/2014  . A-fib (HCC) 09/14/2014  . Hypertension 09/14/2014  . Laceration of eyebrow, left 09/14/2014  . Fall 09/14/2014  . Gastritis,  acute 11/11/2012  . Cellulitis of leg, right 11/11/2012  . Obesity (BMI 30-39.9)- suspected sleep apnea, declines sleep study 11/10/2012  . Psoriatic arthritis (HCC) 11/06/2012  . Iron deficiency anemia- transfused this admission 11/06/2012  . Depression 11/06/2012  . Peripheral neuropathy 11/06/2012  . Shock circulatory on admission 11/06/12 11/06/2012  . Systemic inflammatory response syndrome (SIRS) (HCC) 11/06/2012  .  Nausea and vomiting 08/09/2012  . Adrenal insufficiency (HCC) 08/09/2012  . PAF (paroxysmal atrial fibrillation) (HCC) 08/09/2012  . Hypotension 08/09/2012  . Dehydration 08/09/2012   Past Medical History:  Diagnosis Date  . A-fib (HCC)    "just after knee 2nd OR" (11/07/2012)  . Anemia   . Arthritis    RA , SACROTIC ARTHRITIS  . Complication of anesthesia    found to be in afib when she woke up  . Dysrhythmia   . GERD (gastroesophageal reflux disease)   . Head injury, closed, with concussion   . History of blood transfusion    "after 1 of my knee ORs and today" (11/07/2012)  . Hypertension   . Peripheral neuropathy    "from back OR in 2005; in hands and feet" (11/07/2012)  . Psoriatic arthritis (HCC)    on Prednisone Rx    Family History  Problem Relation Age of Onset  . Heart attack Father        1951-deceased   . Liver disease Mother   . Aneurysm Son 15       Deceased     Past Surgical History:  Procedure Laterality Date  . ANKLE FUSION Right 01/07/2016   Procedure: Right Tibiocalcaneal Fusion;  Surgeon: Nadara Mustard, MD;  Location: [facility] OR;  Service: Orthopedics;  Laterality: Right;  . BACK SURGERY    . CATARACT EXTRACTION W/ INTRAOCULAR LENS  IMPLANT, BILATERAL Bilateral 2007  . CHOLECYSTECTOMY  2000's  . COLONOSCOPY N/A 11/10/2012   Procedure: COLONOSCOPY;  Surgeon: Theda Belfast, MD;  Location[facility] ENDOSCOPY;  Service: Endoscopy;  Laterality: N/A;  . ESOPHAGOGASTRODUODENOSCOPY N/A 11/10/2012   Procedure: ESOPHAGOGASTRODUODENOSCOPY (EGD);  Surgeon: Theda Belfast, MD;  Location: [facility] ENDOSCOPY;  Service: Endoscopy;  Laterality: N/A;  . EYE SURGERY    . JOINT REPLACEMENT    . POSTERIOR LUMBAR FUSION  2005   "got a rod and 4 pins in there" (11/07/2012)  . REPLACEMENT TOTAL KNEE BILATERAL Bilateral 2007  . TRANSTHORACIC ECHOCARDIOGRAM  05/24/2006  . TUBAL LIGATION  1974  . VAGINAL HYSTERECTOMY  1983   Social History   Occupational History  . Not on file    Tobacco Use  . Smoking status: Former Smoker    Packs/day: 0.50    Years: 30.00    Pack years: 15.00    Types: Cigarettes    Last attempt to quit: 08/09/1981    Years since quitting: 35.3  . Smokeless tobacco: Never Used  Substance and Sexual Activity  . Alcohol use: No  . Drug use: No  . Sexual activity: Not Currently

## 2017-01-02 ENCOUNTER — Inpatient Hospital Stay: Admission: RE | Admit: 2017-01-02 | Payer: Medicare Other | Source: Ambulatory Visit

## 2017-01-06 ENCOUNTER — Ambulatory Visit (INDEPENDENT_AMBULATORY_CARE_PROVIDER_SITE_OTHER): Payer: Medicare Other | Admitting: Family

## 2017-01-06 ENCOUNTER — Inpatient Hospital Stay: Admission: RE | Admit: 2017-01-06 | Payer: Medicare Other | Source: Ambulatory Visit

## 2017-01-10 ENCOUNTER — Ambulatory Visit (INDEPENDENT_AMBULATORY_CARE_PROVIDER_SITE_OTHER): Payer: Medicare Other | Admitting: Family

## 2017-01-14 ENCOUNTER — Ambulatory Visit
Admission: RE | Admit: 2017-01-14 | Discharge: 2017-01-14 | Disposition: A | Discharge status: Home / Self Care | Payer: Medicare Other | Source: Ambulatory Visit | Attending: Family | Admitting: Family

## 2017-01-14 DIAGNOSIS — L89612 Pressure ulcer of right heel, stage 2: Secondary | ICD-10-CM

## 2017-01-24 ENCOUNTER — Ambulatory Visit (INDEPENDENT_AMBULATORY_CARE_PROVIDER_SITE_OTHER): Payer: Medicare Other | Admitting: Family

## 2017-01-24 ENCOUNTER — Telehealth (INDEPENDENT_AMBULATORY_CARE_PROVIDER_SITE_OTHER): Payer: Self-pay | Admitting: Family

## 2017-01-24 DIAGNOSIS — I499 Cardiac arrhythmia, unspecified: Secondary | ICD-10-CM | POA: Insufficient documentation

## 2017-01-24 DIAGNOSIS — T4145XA Adverse effect of unspecified anesthetic, initial encounter: Secondary | ICD-10-CM | POA: Insufficient documentation

## 2017-01-24 DIAGNOSIS — D649 Anemia, unspecified: Secondary | ICD-10-CM | POA: Insufficient documentation

## 2017-01-24 DIAGNOSIS — T8859XA Other complications of anesthesia, initial encounter: Secondary | ICD-10-CM | POA: Insufficient documentation

## 2017-01-24 DIAGNOSIS — S060XAA Concussion with loss of consciousness status unknown, initial encounter: Secondary | ICD-10-CM | POA: Insufficient documentation

## 2017-01-24 DIAGNOSIS — K219 Gastro-esophageal reflux disease without esophagitis: Secondary | ICD-10-CM | POA: Insufficient documentation

## 2017-01-24 DIAGNOSIS — S060X9A Concussion with loss of consciousness of unspecified duration, initial encounter: Secondary | ICD-10-CM | POA: Insufficient documentation

## 2017-01-24 DIAGNOSIS — M1990 Unspecified osteoarthritis, unspecified site: Secondary | ICD-10-CM | POA: Insufficient documentation

## 2017-01-24 DIAGNOSIS — Z9289 Personal history of other medical treatment: Secondary | ICD-10-CM | POA: Insufficient documentation

## 2017-01-24 NOTE — Telephone Encounter (Signed)
Patient had to cancel appt today due to husband being in critical condition at the hospital. She does not know when she will be able to reschedule and would like to know her MRI results rather than making an appt right now. Please advise # [phone]

## 2017-01-24 NOTE — Telephone Encounter (Signed)
I called and advised of message below. She expressed understanding, she wishes to call us back after the new year due to her husband condition, he is having a procedure done tomorrow and does not know how he will do.

## 2017-01-24 NOTE — Telephone Encounter (Signed)
No bone infection, still not completely healed bone fusion, follow up to repeat xray in 2 weeks

## 2017-02-08 ENCOUNTER — Ambulatory Visit: Payer: Medicare Other | Admitting: Cardiovascular Disease

## 2017-02-20 ENCOUNTER — Other Ambulatory Visit: Payer: Self-pay | Admitting: Cardiovascular Disease

## 2017-03-03 ENCOUNTER — Ambulatory Visit (INDEPENDENT_AMBULATORY_CARE_PROVIDER_SITE_OTHER): Payer: Medicare Other | Admitting: Family

## 2017-03-03 ENCOUNTER — Encounter (INDEPENDENT_AMBULATORY_CARE_PROVIDER_SITE_OTHER): Payer: Self-pay | Admitting: Family

## 2017-03-03 ENCOUNTER — Ambulatory Visit (INDEPENDENT_AMBULATORY_CARE_PROVIDER_SITE_OTHER): Payer: Self-pay

## 2017-03-03 DIAGNOSIS — S82241G Displaced spiral fracture of shaft of right tibia, subsequent encounter for closed fracture with delayed healing: Secondary | ICD-10-CM | POA: Diagnosis not present

## 2017-03-03 NOTE — Progress Notes (Signed)
Office Visit Note   Patient: Andrea Yu           Date of Birth: [birth year]           MRN: [id number] Visit Date: 03/03/2017              Requested by: Jarome Matin, MD [address] [place], [place] [address] PCP: Jarome Matin, MD  No chief complaint on file.     HPI: Patient is status post a closed nondisplaced fracture of the right tibia. Patient is also status post tibial calcaneal fusion with intramedullary nail as well as total knee arthroplasty on the same side. Has been NON weight bearing in CAM walker. Today is on motorized scooter.   States has plantar heel ulcer which is painful with weight bearing. Has been unable to bear much weight due to heel pain. States pain to ulcerative area as well as deep heel pain has been worsening despite essentially being nonweight bearing on right. However did have an MRI recently of right foot which was negative for osteomyelitis. This did show that tib cal fusion on right has yet to fuse.  Assessment & Plan: Visit Diagnoses:  1. Displaced spiral fracture of shaft of right tibia, subsequent encounter for closed fracture with delayed healing     Plan: Continue with the fracture boot for foot and ankle instability. Will get into DURB for weight bearing. Patient would like heel ulcer to heel before buying DURB. follow-up in 4 weeks for re eval of heel ulcer. Will continue silvadene dressing changes daily to right heel ulcer.   Follow-Up Instructions: No Follow-up on file.   Ortho Exam  Patient is alert, oriented, no adenopathy, well-dressed, normal affect, normal respiratory effort. Patient currently ambulates in a motorized scooter. She has minimal tenderness to palpation at the fracture site there is palpable callus the skin is intact there are no rotational or angular deformities clinically. Painful ulcer to plantar medial heel on right. This is 15 mm in diameter and 3 mm deep. Granulation in wound bed. Thin layer of exudative  tissue. No drainage. No odor or surrounding erythema. Was pared of nonviable surrounding tissue today with 10 blade knife back to viable tissue. No bleeding.   Imaging: No results found. No images are attached to the encounter.  Labs: Lab Results  Component Value Date   REPTSTATUS 11/07/2012 FINAL 11/06/2012   CULT NO GROWTH Performed at [facility] 11/06/2012    Orders:  Orders Placed This Encounter  Procedures  . XR Tibia/Fibula Right   No orders of the defined types were placed in this encounter.    Procedures: No procedures performed  Clinical Data: No additional findings.  ROS:  All other systems negative, except as noted in the HPI. Review of Systems  Constitutional: Negative for chills and fever.  Musculoskeletal: Positive for arthralgias.  Skin: Positive for wound.    Objective: Vital Signs: There were no vitals taken for this visit.  Specialty Comments:  No specialty comments available.  PMFS History: Patient Active Problem List   Diagnosis Date Noted  . History of blood transfusion   . Head injury, closed, with concussion   . GERD (gastroesophageal reflux disease)   . Dysrhythmia   . Complication of anesthesia   . Arthritis   . Anemia   . Displaced spiral fracture of shaft of right tibia, subsequent encounter for closed fracture with delayed healing 11/11/2016  . Decubitus ulcer of heel, stage 2 05/13/2016  . 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 Subacute osteomyelitis, right ankle and foot (HCC)   . SAH (subarachnoid hemorrhage) (HCC) 09/14/2014  . A-fib (HCC) 09/14/2014  . Hypertension 09/14/2014  . Laceration of eyebrow, left 09/14/2014  . Fall 09/14/2014  . Gastritis, acute 11/11/2012  . Cellulitis of leg, right 11/11/2012  . Obesity (BMI 30-39.9)- suspected sleep apnea, declines sleep study 11/10/2012  . Psoriatic  arthritis (HCC) 11/06/2012  . Iron deficiency anemia- transfused this admission 11/06/2012  . Depression 11/06/2012  . Peripheral neuropathy 11/06/2012  . Shock circulatory on admission 11/06/12 11/06/2012  . Systemic inflammatory response syndrome (SIRS) (HCC) 11/06/2012  . Nausea and vomiting 08/09/2012  . Adrenal insufficiency (HCC) 08/09/2012  . PAF (paroxysmal atrial fibrillation) (HCC) 08/09/2012  . Hypotension 08/09/2012  . Dehydration 08/09/2012   Past Medical History:  Diagnosis Date  . A-fib (HCC)    "just after knee 2nd OR" (11/07/2012)  . Anemia   . Arthritis    RA , SACROTIC ARTHRITIS  . Complication of anesthesia    found to be in afib when she woke up  . Dysrhythmia   . GERD (gastroesophageal reflux disease)   . Head injury, closed, with concussion   . History of blood transfusion    "after 1 of my knee ORs and today" (11/07/2012)  . Hypertension   . Peripheral neuropathy    "from back OR in 2005; in hands and feet" (11/07/2012)  . Psoriatic arthritis (HCC)    on Prednisone Rx    Family History  Problem Relation Age of Onset  . Heart attack Father        1951-deceased   . Liver disease Mother   . Aneurysm Son 30       Deceased     Past Surgical History:  Procedure Laterality Date  . ANKLE FUSION Right 01/07/2016   Procedure: Right Tibiocalcaneal Fusion;  Surgeon: Nadara Mustard, MD;  Location: [facility] OR;  Service: Orthopedics;  Laterality: Right;  . BACK SURGERY    . CATARACT EXTRACTION W/ INTRAOCULAR LENS  IMPLANT, BILATERAL Bilateral 2007  . CHOLECYSTECTOMY  2000's  . COLONOSCOPY N/A 11/10/2012   Procedure: COLONOSCOPY;  Surgeon: Theda Belfast, MD;  Location: [facility] ENDOSCOPY;  Service: Endoscopy;  Laterality: N/A;  . ESOPHAGOGASTRODUODENOSCOPY N/A 11/10/2012   Procedure: ESOPHAGOGASTRODUODENOSCOPY (EGD);  Surgeon: Theda Belfast, MD;  Location: [facility] ENDOSCOPY;  Service: Endoscopy;  Laterality: N/A;  . EYE SURGERY    . JOINT REPLACEMENT    . POSTERIOR  LUMBAR FUSION  2005   "got a rod and 4 pins in there" (11/07/2012)  . REPLACEMENT TOTAL KNEE BILATERAL Bilateral 2007  . TRANSTHORACIC ECHOCARDIOGRAM  05/24/2006  . TUBAL LIGATION  1974  . VAGINAL HYSTERECTOMY  1983   Social History   Occupational History  . Not on file  Tobacco Use  . Smoking status: Former Smoker    Packs/day: 0.50    Years: 30.00    Pack years: 15.00    Types: Cigarettes    Last attempt to quit: 08/09/1981    Years since quitting: 35.5  . Smokeless tobacco: Never Used  Substance and Sexual Activity  . Alcohol use: No  . Drug use: No  . Sexual activity: Not Currently

## 2017-03-04 ENCOUNTER — Ambulatory Visit (INDEPENDENT_AMBULATORY_CARE_PROVIDER_SITE_OTHER): Payer: Medicare Other | Admitting: Family

## 2017-03-31 ENCOUNTER — Ambulatory Visit (INDEPENDENT_AMBULATORY_CARE_PROVIDER_SITE_OTHER): Payer: Medicare Other | Admitting: Orthopedic Surgery

## 2017-04-17 ENCOUNTER — Emergency Department (HOSPITAL_COMMUNITY): Payer: Medicare Other

## 2017-04-17 ENCOUNTER — Inpatient Hospital Stay (HOSPITAL_COMMUNITY)
Admission: EM | Admit: 2017-04-17 | Discharge: 2017-04-22 | DRG: 871 | Disposition: A | Discharge status: Skilled Nursing Facility | Payer: Medicare Other | Attending: Internal Medicine | Admitting: Internal Medicine

## 2017-04-17 ENCOUNTER — Other Ambulatory Visit: Payer: Self-pay

## 2017-04-17 ENCOUNTER — Encounter (HOSPITAL_COMMUNITY): Payer: Self-pay

## 2017-04-17 ENCOUNTER — Inpatient Hospital Stay (HOSPITAL_COMMUNITY): Payer: Medicare Other

## 2017-04-17 DIAGNOSIS — Z880 Allergy status to penicillin: Secondary | ICD-10-CM

## 2017-04-17 DIAGNOSIS — Z881 Allergy status to other antibiotic agents status: Secondary | ICD-10-CM | POA: Diagnosis not present

## 2017-04-17 DIAGNOSIS — L89613 Pressure ulcer of right heel, stage 3: Secondary | ICD-10-CM | POA: Diagnosis present

## 2017-04-17 DIAGNOSIS — Z7982 Long term (current) use of aspirin: Secondary | ICD-10-CM

## 2017-04-17 DIAGNOSIS — A401 Sepsis due to streptococcus, group B: Secondary | ICD-10-CM | POA: Diagnosis present

## 2017-04-17 DIAGNOSIS — B9689 Other specified bacterial agents as the cause of diseases classified elsewhere: Secondary | ICD-10-CM | POA: Diagnosis not present

## 2017-04-17 DIAGNOSIS — T380X5A Adverse effect of glucocorticoids and synthetic analogues, initial encounter: Secondary | ICD-10-CM | POA: Diagnosis present

## 2017-04-17 DIAGNOSIS — M545 Low back pain: Secondary | ICD-10-CM | POA: Diagnosis present

## 2017-04-17 DIAGNOSIS — L89151 Pressure ulcer of sacral region, stage 1: Secondary | ICD-10-CM | POA: Diagnosis not present

## 2017-04-17 DIAGNOSIS — Z66 Do not resuscitate: Secondary | ICD-10-CM | POA: Diagnosis present

## 2017-04-17 DIAGNOSIS — L89603 Pressure ulcer of unspecified heel, stage 3: Secondary | ICD-10-CM | POA: Diagnosis present

## 2017-04-17 DIAGNOSIS — E876 Hypokalemia: Secondary | ICD-10-CM | POA: Diagnosis present

## 2017-04-17 DIAGNOSIS — K219 Gastro-esophageal reflux disease without esophagitis: Secondary | ICD-10-CM | POA: Diagnosis present

## 2017-04-17 DIAGNOSIS — M810 Age-related osteoporosis without current pathological fracture: Secondary | ICD-10-CM | POA: Diagnosis present

## 2017-04-17 DIAGNOSIS — G629 Polyneuropathy, unspecified: Secondary | ICD-10-CM

## 2017-04-17 DIAGNOSIS — L0291 Cutaneous abscess, unspecified: Secondary | ICD-10-CM

## 2017-04-17 DIAGNOSIS — Z888 Allergy status to other drugs, medicaments and biological substances status: Secondary | ICD-10-CM

## 2017-04-17 DIAGNOSIS — I4891 Unspecified atrial fibrillation: Secondary | ICD-10-CM | POA: Diagnosis present

## 2017-04-17 DIAGNOSIS — Z9181 History of falling: Secondary | ICD-10-CM

## 2017-04-17 DIAGNOSIS — Z79899 Other long term (current) drug therapy: Secondary | ICD-10-CM

## 2017-04-17 DIAGNOSIS — R509 Fever, unspecified: Secondary | ICD-10-CM

## 2017-04-17 DIAGNOSIS — I480 Paroxysmal atrial fibrillation: Secondary | ICD-10-CM | POA: Diagnosis present

## 2017-04-17 DIAGNOSIS — R0902 Hypoxemia: Secondary | ICD-10-CM | POA: Diagnosis present

## 2017-04-17 DIAGNOSIS — L4050 Arthropathic psoriasis, unspecified: Secondary | ICD-10-CM | POA: Diagnosis present

## 2017-04-17 DIAGNOSIS — F329 Major depressive disorder, single episode, unspecified: Secondary | ICD-10-CM | POA: Diagnosis present

## 2017-04-17 DIAGNOSIS — N390 Urinary tract infection, site not specified: Secondary | ICD-10-CM | POA: Diagnosis present

## 2017-04-17 DIAGNOSIS — M868X8 Other osteomyelitis, other site: Secondary | ICD-10-CM | POA: Diagnosis present

## 2017-04-17 DIAGNOSIS — Z96653 Presence of artificial knee joint, bilateral: Secondary | ICD-10-CM | POA: Diagnosis present

## 2017-04-17 DIAGNOSIS — M7061 Trochanteric bursitis, right hip: Secondary | ICD-10-CM | POA: Diagnosis present

## 2017-04-17 DIAGNOSIS — G9341 Metabolic encephalopathy: Secondary | ICD-10-CM | POA: Diagnosis present

## 2017-04-17 DIAGNOSIS — E1142 Type 2 diabetes mellitus with diabetic polyneuropathy: Secondary | ICD-10-CM | POA: Diagnosis present

## 2017-04-17 DIAGNOSIS — M869 Osteomyelitis, unspecified: Secondary | ICD-10-CM | POA: Diagnosis not present

## 2017-04-17 DIAGNOSIS — R4182 Altered mental status, unspecified: Secondary | ICD-10-CM | POA: Diagnosis present

## 2017-04-17 DIAGNOSIS — G8929 Other chronic pain: Secondary | ICD-10-CM | POA: Diagnosis present

## 2017-04-17 DIAGNOSIS — E1169 Type 2 diabetes mellitus with other specified complication: Secondary | ICD-10-CM | POA: Diagnosis present

## 2017-04-17 DIAGNOSIS — R7881 Bacteremia: Secondary | ICD-10-CM | POA: Diagnosis not present

## 2017-04-17 DIAGNOSIS — L02415 Cutaneous abscess of right lower limb: Secondary | ICD-10-CM | POA: Diagnosis present

## 2017-04-17 DIAGNOSIS — I351 Nonrheumatic aortic (valve) insufficiency: Secondary | ICD-10-CM | POA: Diagnosis not present

## 2017-04-17 DIAGNOSIS — I272 Pulmonary hypertension, unspecified: Secondary | ICD-10-CM | POA: Diagnosis present

## 2017-04-17 DIAGNOSIS — M549 Dorsalgia, unspecified: Secondary | ICD-10-CM

## 2017-04-17 DIAGNOSIS — L8990 Pressure ulcer of unspecified site, unspecified stage: Secondary | ICD-10-CM

## 2017-04-17 DIAGNOSIS — B999 Unspecified infectious disease: Secondary | ICD-10-CM

## 2017-04-17 DIAGNOSIS — A419 Sepsis, unspecified organism: Secondary | ICD-10-CM

## 2017-04-17 DIAGNOSIS — E11622 Type 2 diabetes mellitus with other skin ulcer: Secondary | ICD-10-CM | POA: Diagnosis not present

## 2017-04-17 DIAGNOSIS — Z7952 Long term (current) use of systemic steroids: Secondary | ICD-10-CM

## 2017-04-17 DIAGNOSIS — I10 Essential (primary) hypertension: Secondary | ICD-10-CM | POA: Diagnosis not present

## 2017-04-17 DIAGNOSIS — Z981 Arthrodesis status: Secondary | ICD-10-CM

## 2017-04-17 DIAGNOSIS — B9620 Unspecified Escherichia coli [E. coli] as the cause of diseases classified elsewhere: Secondary | ICD-10-CM | POA: Diagnosis not present

## 2017-04-17 LAB — URINALYSIS, COMPLETE (UACMP) WITH MICROSCOPIC
Bilirubin Urine: NEGATIVE
Glucose, UA: NEGATIVE mg/dL
KETONES UR: NEGATIVE mg/dL
NITRITE: NEGATIVE
PH: 5 (ref 5.0–8.0)
Protein, ur: 30 mg/dL — AB
SPECIFIC GRAVITY, URINE: 1.015 (ref 1.005–1.030)

## 2017-04-17 LAB — COMPREHENSIVE METABOLIC PANEL
ALT: 10 U/L — AB (ref 14–54)
AST: 21 U/L (ref 15–41)
Albumin: 2.8 g/dL — ABNORMAL LOW (ref 3.5–5.0)
Alkaline Phosphatase: 119 U/L (ref 38–126)
Anion gap: 14 (ref 5–15)
BILIRUBIN TOTAL: 0.9 mg/dL (ref 0.3–1.2)
BUN: 12 mg/dL (ref 6–20)
CO2: 22 mmol/L (ref 22–32)
CREATININE: 1 mg/dL (ref 0.44–1.00)
Calcium: 8.9 mg/dL (ref 8.9–10.3)
Chloride: 101 mmol/L (ref 101–111)
GFR calc Af Amer: >60 mL/min (ref >60)
GFR, EST NON AFRICAN AMERICAN: 53 mL/min — AB (ref >60)
GLUCOSE: 129 mg/dL — AB (ref 65–99)
Potassium: 3.2 mmol/L — ABNORMAL LOW (ref 3.5–5.1)
Sodium: 137 mmol/L (ref 135–145)
TOTAL PROTEIN: 6.5 g/dL (ref 6.5–8.1)

## 2017-04-17 LAB — CBC WITH DIFFERENTIAL/PLATELET
BASOS PCT: 0 %
Basophils Absolute: 0 10*3/uL (ref 0.0–0.1)
EOS ABS: 0 10*3/uL (ref 0.0–0.7)
Eosinophils Relative: 0 %
HEMATOCRIT: 31.4 % — AB (ref 36.0–46.0)
Hemoglobin: 10.5 g/dL — ABNORMAL LOW (ref 12.0–15.0)
Lymphocytes Relative: 3 %
Lymphs Abs: 0.7 10*3/uL (ref 0.7–4.0)
MCH: 28.9 pg (ref 26.0–34.0)
MCHC: 33.4 g/dL (ref 30.0–36.0)
MCV: 86.5 fL (ref 78.0–100.0)
MONOS PCT: 3 %
Monocytes Absolute: 0.7 10*3/uL (ref 0.1–1.0)
NEUTROS ABS: 21.4 10*3/uL — AB (ref 1.7–7.7)
Neutrophils Relative %: 94 %
Platelets: 314 10*3/uL (ref 150–400)
RBC: 3.63 MIL/uL — ABNORMAL LOW (ref 3.87–5.11)
RDW: 14.3 % (ref 11.5–15.5)
WBC: 22.9 10*3/uL — AB (ref 4.0–10.5)

## 2017-04-17 LAB — BLOOD CULTURE ID PANEL (REFLEXED)
Acinetobacter baumannii: NOT DETECTED
CANDIDA TROPICALIS: NOT DETECTED
Candida albicans: NOT DETECTED
Candida glabrata: NOT DETECTED
Candida krusei: NOT DETECTED
Candida parapsilosis: NOT DETECTED
Enterobacter cloacae complex: NOT DETECTED
Enterobacteriaceae species: NOT DETECTED
Enterococcus species: NOT DETECTED
Escherichia coli: NOT DETECTED
HAEMOPHILUS INFLUENZAE: NOT DETECTED
Klebsiella oxytoca: NOT DETECTED
Klebsiella pneumoniae: NOT DETECTED
Listeria monocytogenes: NOT DETECTED
NEISSERIA MENINGITIDIS: NOT DETECTED
PROTEUS SPECIES: NOT DETECTED
Pseudomonas aeruginosa: NOT DETECTED
STAPHYLOCOCCUS SPECIES: NOT DETECTED
STREPTOCOCCUS AGALACTIAE: DETECTED — AB
Serratia marcescens: NOT DETECTED
Staphylococcus aureus (BCID): NOT DETECTED
Streptococcus pneumoniae: NOT DETECTED
Streptococcus pyogenes: NOT DETECTED
Streptococcus species: DETECTED — AB

## 2017-04-17 LAB — I-STAT CG4 LACTIC ACID, ED
LACTIC ACID, VENOUS: 2.56 mmol/L — AB (ref 0.5–1.9)
LACTIC ACID, VENOUS: 1.33 mmol/L (ref 0.5–1.9)

## 2017-04-17 LAB — GLUCOSE, CAPILLARY
GLUCOSE-CAPILLARY: 80 mg/dL (ref 65–99)
Glucose-Capillary: 97 mg/dL (ref 65–99)

## 2017-04-17 LAB — MRSA PCR SCREENING: MRSA BY PCR: NEGATIVE

## 2017-04-17 MED ORDER — POTASSIUM CHLORIDE 10 MEQ/100ML IV SOLN
10.0000 meq | INTRAVENOUS | Status: AC
  Administered 2017-04-17 (×4): 10 meq via INTRAVENOUS
  Filled 2017-04-17: qty 100

## 2017-04-17 MED ORDER — BISACODYL 5 MG PO TBEC: 5.0000 mg | DELAYED_RELEASE_TABLET | Freq: Every day | ORAL | Status: DC | PRN

## 2017-04-17 MED ORDER — ACETAMINOPHEN 650 MG RE SUPP: 650.0000 mg | Freq: Four times a day (QID) | RECTAL | Status: DC | PRN

## 2017-04-17 MED ORDER — INSULIN ASPART 100 UNIT/ML ~~LOC~~ SOLN
0.0000 [IU] | Freq: Three times a day (TID) | SUBCUTANEOUS | Status: DC
  Administered 2017-04-22: 2 [IU] via SUBCUTANEOUS

## 2017-04-17 MED ORDER — PREDNISONE 5 MG PO TABS
5.0000 mg | ORAL_TABLET | Freq: Every day | ORAL | Status: DC
  Administered 2017-04-17 – 2017-04-22 (×6): 5 mg via ORAL
  Filled 2017-04-17 (×6): qty 1

## 2017-04-17 MED ORDER — ASPIRIN EC 81 MG PO TBEC
81.0000 mg | DELAYED_RELEASE_TABLET | Freq: Every evening | ORAL | Status: DC
  Administered 2017-04-18 – 2017-04-21 (×4): 81 mg via ORAL
  Filled 2017-04-17 (×4): qty 1

## 2017-04-17 MED ORDER — SODIUM CHLORIDE 0.9 % IV SOLN
INTRAVENOUS | Status: AC
  Administered 2017-04-17: 13:00:00 via INTRAVENOUS

## 2017-04-17 MED ORDER — MIDAZOLAM HCL 2 MG/2ML IJ SOLN
INTRAMUSCULAR | Status: AC
  Filled 2017-04-17: qty 4

## 2017-04-17 MED ORDER — NALOXONE HCL 0.4 MG/ML IJ SOLN
INTRAMUSCULAR | Status: AC
  Filled 2017-04-17: qty 1

## 2017-04-17 MED ORDER — HYDROCORTISONE NA SUCCINATE PF 100 MG IJ SOLR
100.0000 mg | Freq: Once | INTRAMUSCULAR | Status: AC
  Administered 2017-04-17: 100 mg via INTRAVENOUS
  Filled 2017-04-17: qty 2

## 2017-04-17 MED ORDER — IOPAMIDOL (ISOVUE-370) INJECTION 76%
100.0000 mL | Freq: Once | INTRAVENOUS | Status: AC | PRN
  Administered 2017-04-17: 100 mL via INTRAVENOUS

## 2017-04-17 MED ORDER — IOPAMIDOL (ISOVUE-370) INJECTION 76%
INTRAVENOUS | Status: AC
  Filled 2017-04-17: qty 100

## 2017-04-17 MED ORDER — PREDNISONE 5 MG PO TABS: 5.0000 mg | ORAL_TABLET | Freq: Every day | ORAL | Status: DC

## 2017-04-17 MED ORDER — DILTIAZEM HCL-DEXTROSE 100-5 MG/100ML-% IV SOLN (PREMIX)
5.0000 mg/h | INTRAVENOUS | Status: AC
  Administered 2017-04-17 – 2017-04-18 (×2): 5 mg/h via INTRAVENOUS
  Filled 2017-04-17 (×2): qty 100

## 2017-04-17 MED ORDER — DILTIAZEM HCL 60 MG PO TABS: 60.0000 mg | ORAL_TABLET | Freq: Once | ORAL | Status: DC

## 2017-04-17 MED ORDER — DULOXETINE HCL 30 MG PO CPEP: 30.0000 mg | ORAL_CAPSULE | Freq: Every day | ORAL | Status: DC

## 2017-04-17 MED ORDER — AZTREONAM IN DEXTROSE 2 GM/50ML IV SOLN
2.0000 g | Freq: Once | INTRAVENOUS | Status: AC
  Administered 2017-04-17: 2 g via INTRAVENOUS
  Filled 2017-04-17: qty 50

## 2017-04-17 MED ORDER — SODIUM CHLORIDE 0.9 % IV BOLUS (SEPSIS)
1000.0000 mL | Freq: Once | INTRAVENOUS | Status: AC
  Administered 2017-04-17: 1000 mL via INTRAVENOUS

## 2017-04-17 MED ORDER — DULOXETINE HCL 30 MG PO CPEP: 60.0000 mg | ORAL_CAPSULE | Freq: Every day | ORAL | Status: DC

## 2017-04-17 MED ORDER — SENNOSIDES-DOCUSATE SODIUM 8.6-50 MG PO TABS: 1.0000 | ORAL_TABLET | Freq: Every evening | ORAL | Status: DC | PRN

## 2017-04-17 MED ORDER — MORPHINE SULFATE (PF) 4 MG/ML IV SOLN
2.0000 mg | INTRAVENOUS | Status: DC | PRN
  Administered 2017-04-17 – 2017-04-19 (×5): 2 mg via INTRAVENOUS
  Filled 2017-04-17 (×5): qty 1

## 2017-04-17 MED ORDER — SODIUM CHLORIDE 0.9 % IJ SOLN
INTRAMUSCULAR | Status: AC
  Administered 2017-04-17: 09:00:00
  Filled 2017-04-17: qty 50

## 2017-04-17 MED ORDER — PANTOPRAZOLE SODIUM 40 MG PO TBEC: 40.0000 mg | DELAYED_RELEASE_TABLET | Freq: Every day | ORAL | Status: DC

## 2017-04-17 MED ORDER — ACETAMINOPHEN 650 MG RE SUPP
650.0000 mg | Freq: Once | RECTAL | Status: AC
  Administered 2017-04-17: 650 mg via RECTAL
  Filled 2017-04-17: qty 1

## 2017-04-17 MED ORDER — HEPARIN SODIUM (PORCINE) 5000 UNIT/ML IJ SOLN
5000.0000 [IU] | Freq: Three times a day (TID) | INTRAMUSCULAR | Status: DC
  Administered 2017-04-17 – 2017-04-22 (×16): 5000 [IU] via SUBCUTANEOUS
  Filled 2017-04-17 (×16): qty 1

## 2017-04-17 MED ORDER — FENTANYL CITRATE (PF) 100 MCG/2ML IJ SOLN
50.0000 ug | Freq: Once | INTRAMUSCULAR | Status: AC
  Administered 2017-04-17: 50 ug via INTRAVENOUS
  Filled 2017-04-17: qty 2

## 2017-04-17 MED ORDER — ONDANSETRON HCL 4 MG PO TABS: 4.0000 mg | ORAL_TABLET | Freq: Four times a day (QID) | ORAL | Status: DC | PRN

## 2017-04-17 MED ORDER — VANCOMYCIN HCL IN DEXTROSE 750-5 MG/150ML-% IV SOLN
750.0000 mg | INTRAVENOUS | Status: DC
  Administered 2017-04-18 – 2017-04-20 (×3): 750 mg via INTRAVENOUS
  Filled 2017-04-17 (×6): qty 150

## 2017-04-17 MED ORDER — FENTANYL CITRATE (PF) 100 MCG/2ML IJ SOLN
INTRAMUSCULAR | Status: AC | PRN
  Administered 2017-04-17: 50 ug via INTRAVENOUS

## 2017-04-17 MED ORDER — VANCOMYCIN HCL 10 G IV SOLR
2000.0000 mg | Freq: Once | INTRAVENOUS | Status: AC
  Administered 2017-04-17: 2000 mg via INTRAVENOUS
  Filled 2017-04-17: qty 2000

## 2017-04-17 MED ORDER — PANTOPRAZOLE SODIUM 40 MG PO TBEC
40.0000 mg | DELAYED_RELEASE_TABLET | Freq: Every day | ORAL | Status: DC
  Administered 2017-04-17 – 2017-04-22 (×6): 40 mg via ORAL
  Filled 2017-04-17 (×6): qty 1

## 2017-04-17 MED ORDER — AZTREONAM IN DEXTROSE 2 GM/50ML IV SOLN
2.0000 g | Freq: Three times a day (TID) | INTRAVENOUS | Status: DC
  Administered 2017-04-17 – 2017-04-20 (×10): 2 g via INTRAVENOUS
  Filled 2017-04-17 (×12): qty 50

## 2017-04-17 MED ORDER — FENTANYL CITRATE (PF) 100 MCG/2ML IJ SOLN
INTRAMUSCULAR | Status: AC | PRN
  Administered 2017-04-17: 25 ug via INTRAVENOUS

## 2017-04-17 MED ORDER — HYDROCODONE-ACETAMINOPHEN 5-325 MG PO TABS
1.0000 | ORAL_TABLET | ORAL | Status: DC | PRN
  Administered 2017-04-17 (×2): 2 via ORAL
  Administered 2017-04-17: 1 via ORAL
  Administered 2017-04-18 – 2017-04-21 (×7): 2 via ORAL
  Administered 2017-04-21: 1 via ORAL
  Administered 2017-04-22 (×2): 2 via ORAL
  Filled 2017-04-17: qty 1
  Filled 2017-04-17: qty 2
  Filled 2017-04-17: qty 1
  Filled 2017-04-17 (×6): qty 2
  Filled 2017-04-17: qty 1
  Filled 2017-04-17 (×4): qty 2

## 2017-04-17 MED ORDER — MIDAZOLAM HCL 2 MG/2ML IJ SOLN
INTRAMUSCULAR | Status: AC | PRN
  Administered 2017-04-17: 1 mg via INTRAVENOUS

## 2017-04-17 MED ORDER — MIDAZOLAM HCL 2 MG/2ML IJ SOLN
INTRAMUSCULAR | Status: AC | PRN
  Administered 2017-04-17: 0.5 mg via INTRAVENOUS

## 2017-04-17 MED ORDER — DULOXETINE HCL 30 MG PO CPEP
90.0000 mg | ORAL_CAPSULE | Freq: Every day | ORAL | Status: DC
  Administered 2017-04-17 – 2017-04-22 (×6): 90 mg via ORAL
  Filled 2017-04-17 (×6): qty 3

## 2017-04-17 MED ORDER — FLUMAZENIL 0.5 MG/5ML IV SOLN
INTRAVENOUS | Status: AC
  Filled 2017-04-17: qty 5

## 2017-04-17 MED ORDER — INSULIN ASPART 100 UNIT/ML ~~LOC~~ SOLN: 0.0000 [IU] | Freq: Every day | SUBCUTANEOUS | Status: DC

## 2017-04-17 MED ORDER — LEVOFLOXACIN IN D5W 750 MG/150ML IV SOLN
750.0000 mg | Freq: Once | INTRAVENOUS | Status: AC
  Administered 2017-04-17: 750 mg via INTRAVENOUS
  Filled 2017-04-17: qty 150

## 2017-04-17 MED ORDER — DILTIAZEM HCL-DEXTROSE 100-5 MG/100ML-% IV SOLN (PREMIX)
5.0000 mg/h | Freq: Once | INTRAVENOUS | Status: AC
  Administered 2017-04-17: 5 mg/h via INTRAVENOUS
  Filled 2017-04-17: qty 100

## 2017-04-17 MED ORDER — VANCOMYCIN HCL IN DEXTROSE 1-5 GM/200ML-% IV SOLN: 1000.0000 mg | Freq: Once | INTRAVENOUS | Status: DC

## 2017-04-17 MED ORDER — FENTANYL CITRATE (PF) 100 MCG/2ML IJ SOLN
INTRAMUSCULAR | Status: AC
  Filled 2017-04-17: qty 4

## 2017-04-17 MED ORDER — ONDANSETRON HCL 4 MG/2ML IJ SOLN: 4.0000 mg | Freq: Four times a day (QID) | INTRAMUSCULAR | Status: DC | PRN

## 2017-04-17 MED ORDER — ACETAMINOPHEN 325 MG PO TABS: 650.0000 mg | ORAL_TABLET | Freq: Four times a day (QID) | ORAL | Status: DC | PRN

## 2017-04-17 NOTE — ED Notes (Signed)
Bed: UT65 Expected date:  Expected time:  Means of arrival:  Comments: EMS female back spasms-fell 2 days ago

## 2017-04-17 NOTE — Progress Notes (Signed)
MD made aware of patient's "excruciating pain" (9/10) despite PO PRN pain med. Awaiting further orders. Will continue to monitor.

## 2017-04-17 NOTE — ED Triage Notes (Signed)
Larey Seat a couple days ago at home no pain at that time but tonight developed back spams/pain no bowel or bladder problems.

## 2017-04-17 NOTE — Sedation Documentation (Signed)
Patient denies pain and is resting comfortably.  

## 2017-04-17 NOTE — ED Provider Notes (Signed)
[facility]-EMERGENCY DEPT Provider Note   CSN: [id number] Arrival date & time: 04/17/17  0445     History   Chief Complaint Chief Complaint  Patient presents with  . Back Pain    HPI Andrea Yu is a 77 y.o. female with a hx of A. Fib (not anticoagulated), anemia, GERD, hypertension, psoriatic arthritis on chronic steroids presents to the Emergency Department complaining of gradual, persistent, progressively worsening generalized back pain onset yesterday evening.  Patient's daughter at bedside provides almost the entire history.  Level 5 caveat for acuity of condition and mental status change.  Patient's daughter reports that her mother slipped out of the car on Thursday evening landing on her buttocks on the ground.  Daughter reports that her mother's legs are very weak and she has severe neuropathy.  She reports this is not unusual and patient was able to be assisted up without difficulty.  She had no pain that day or the next day.  Saturday evening, patient developed some mild generalized back pain and was given Tylenol.  She went to bed without complaint.  She awoke around 3 AM with severe sharp back pain throughout her entire back.  This prompted daughter to contact EMS.  The history is provided by the patient and medical records. No language interpreter was used.    Past Medical History:  Diagnosis Date  . A-fib (HCC)    "just after knee 2nd OR" (11/07/2012)  . Anemia   . Arthritis    RA , SACROTIC ARTHRITIS  . Complication of anesthesia    found to be in afib when she woke up  . Dysrhythmia   . GERD (gastroesophageal reflux disease)   . Head injury, closed, with concussion   . History of blood transfusion    "after 1 of my knee ORs and today" (11/07/2012)  . Hypertension   . Peripheral neuropathy    "from back OR in 2005; in hands and feet" (11/07/2012)  . Psoriatic arthritis (HCC)    on Prednisone Rx    Patient Active Problem List   Diagnosis Date Noted  . History of blood transfusion   . Head injury, closed, with concussion   . GERD (gastroesophageal reflux disease)   . Dysrhythmia   . Complication of anesthesia   . Arthritis   . Anemia   . Displaced spiral fracture of shaft of right tibia, subsequent encounter for closed fracture with delayed healing 11/11/2016  . Decubitus ulcer of heel, stage 2 05/13/2016  . 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 Subacute osteomyelitis, right ankle and foot (HCC)   . SAH (subarachnoid hemorrhage) (HCC) 09/14/2014  . A-fib (HCC) 09/14/2014  . Hypertension 09/14/2014  . Laceration of eyebrow, left 09/14/2014  . Fall 09/14/2014  . Gastritis, acute 11/11/2012  . Cellulitis of leg, right 11/11/2012  . Obesity (BMI 30-39.9)- suspected sleep apnea, declines sleep study 11/10/2012  . Psoriatic arthritis (HCC) 11/06/2012  . Iron deficiency anemia- transfused this admission 11/06/2012  . Depression 11/06/2012  . Peripheral neuropathy 11/06/2012  . Shock circulatory on admission 11/06/12 11/06/2012  . Systemic inflammatory response syndrome (SIRS) (HCC) 11/06/2012  . Nausea and vomiting 08/09/2012  . Adrenal insufficiency (HCC) 08/09/2012  . PAF (paroxysmal atrial fibrillation) (HCC) 08/09/2012  . Hypotension 08/09/2012  . Dehydration 08/09/2012    Past Surgical History:  Procedure Laterality Date  . ANKLE FUSION Right 01/07/2016  Procedure: Right Tibiocalcaneal Fusion;  Surgeon: Nadara Mustard, MD;  Location: [facility] OR;  Service: Orthopedics;  Laterality: Right;  . BACK SURGERY    . CATARACT EXTRACTION W/ INTRAOCULAR LENS  IMPLANT, BILATERAL Bilateral 2007  . CHOLECYSTECTOMY  2000's  . COLONOSCOPY N/A 11/10/2012   Procedure: COLONOSCOPY;  Surgeon: Theda Belfast, MD;  Location: [facility] ENDOSCOPY;  Service: Endoscopy;  Laterality: N/A;  . ESOPHAGOGASTRODUODENOSCOPY N/A 11/10/2012    Procedure: ESOPHAGOGASTRODUODENOSCOPY (EGD);  Surgeon: Theda Belfast, MD;  Location: [facility] ENDOSCOPY;  Service: Endoscopy;  Laterality: N/A;  . EYE SURGERY    . JOINT REPLACEMENT    . POSTERIOR LUMBAR FUSION  2005   "got a rod and 4 pins in there" (11/07/2012)  . REPLACEMENT TOTAL KNEE BILATERAL Bilateral 2007  . TRANSTHORACIC ECHOCARDIOGRAM  05/24/2006  . TUBAL LIGATION  1974  . VAGINAL HYSTERECTOMY  1983     OB History   None      Home Medications    Prior to Admission medications   Medication Sig Start Date End Date Taking? Authorizing Provider  aspirin EC 81 MG tablet Take 81 mg by mouth every evening.   Yes [provider]  BIOTIN 5000 PO Take 5,000 mg by mouth at bedtime.    Yes [provider]  DULoxetine (CYMBALTA) 30 MG capsule Take 30 mg by mouth daily. Take along with 60 mg capsule=90 mg 03/24/17  Yes [provider]  DULoxetine (CYMBALTA) 60 MG capsule Take 60 mg by mouth daily. Take along with 30mg  capsule to make a total of 90mg    Yes [provider]  furosemide (LASIX) 80 MG tablet Take 80 mg by mouth.   Yes [provider]  gabapentin (NEURONTIN) 600 MG tablet Take 600-1,200 mg by mouth 2 (two) times daily. 1 tablet in morning and 2 tablets at night   Yes [provider]  HYDROcodone-acetaminophen (NORCO/VICODIN) 5-325 MG per tablet Take 1-2 tablets by mouth every 6 (six) hours as needed (for pain (scheduled 1 tablet at bedtime)).    Yes [provider]  Magnesium 250 MG TABS Take 1 tablet by mouth every evening.    Yes [provider]  Multiple Vitamin (MULTIVITAMIN WITH MINERALS) TABS Take 1 tablet by mouth daily.   Yes [provider]  omeprazole (PRILOSEC) 20 MG capsule Take 20 mg by mouth every evening. 11/08/15  Yes [provider]  potassium chloride SA (K-DUR,KLOR-CON) 20 MEQ tablet Take 20 mEq by mouth 2 (two) times daily.    Yes [provider]  predniSONE  (DELTASONE) 5 MG tablet Take 5 mg by mouth daily.  08/12/12  Yes Tisovec, Adelfa Koh, MD  acetaminophen (TYLENOL) 325 MG tablet Take 2 tablets (650 mg total) by mouth every 4 (four) hours as needed. Patient taking differently: Take 325 mg by mouth every 6 (six) hours as needed (for pain.).  11/11/12   Christiane Ha, MD  Apremilast (OTEZLA) 30 MG TABS Take 30 mg by mouth 2 (two) times daily.    [provider]  DILT-XR 180 MG 24 hr capsule Take 180 mg by mouth at bedtime. 04/07/17   [provider]  FORTEO 600 MCG/2.4ML SOLN Inject 20 mcg into the skin daily.  03/26/17   [provider]  furosemide (LASIX) 40 MG tablet TAKE 2 TABLETS BY MOUTH  DAILY Patient not taking: Reported on 04/17/2017 10/20/16   Croitoru, Mihai, MD  losartan (COZAAR) 100 MG tablet Take 100 mg by mouth daily.  03/09/17  [provider]  metoprolol succinate (TOPROL-XL) 100 MG 24 hr tablet TAKE 1 TABLET BY MOUTH  DAILY 02/21/17   Croitoru, Mihai, MD  mupirocin ointment (BACTROBAN) 2 % Apply 1 application topically 2 (two) times daily. Apply to the affected area 2 times a day Patient not taking: Reported on 04/17/2017 01/29/16   Nadara Mustard, MD  PROAIR HFA 108 [phone] BASE) MCG/ACT inhaler Inhale 1-2 puffs into the lungs every 6 (six) hours as needed for wheezing or shortness of breath.  02/06/13   [provider]  silver sulfADIAZINE (SILVADENE) 1 % cream Apply 1 application topically daily. Patient not taking: Reported on 04/17/2017 11/11/16   Adonis Huguenin, NP  Vitamin D, Ergocalciferol, (DRISDOL) 50000 UNITS CAPS Take 50,000 Units by mouth every Tuesday.     [provider]    Family History Family History  Problem Relation Age of Onset  . Heart attack Father        1951-deceased   . Liver disease Mother   . Aneurysm Son 64       Deceased     Social History Social History   Tobacco Use  . Smoking status: Former Smoker    Packs/day: 0.50    Years: 30.00    Pack  years: 15.00    Types: Cigarettes    Last attempt to quit: 08/09/1981    Years since quitting: 35.7  . Smokeless tobacco: Never Used  Substance Use Topics  . Alcohol use: No  . Drug use: No     Allergies   Penicillins; Prolia [denosumab]; and Fosamax [alendronate sodium]   Review of Systems Review of Systems  Unable to perform ROS: Mental status change  Musculoskeletal: Positive for back pain.     Physical Exam Updated Vital Signs BP (!) 173/89   Pulse 98   Resp (!) 24   Ht 5\' 3"  (1.6 m)   Wt 83.9 kg (185 lb)   SpO2 95%   BMI 32.77 kg/m   Physical Exam  Constitutional: She appears well-developed and well-nourished. She appears lethargic. She appears distressed.  Obtunded Distressed  HENT:  Head: Normocephalic and atraumatic.  Mouth/Throat: Mucous membranes are dry.  Eyes: Pupils are equal, round, and reactive to light. Conjunctivae are normal. No scleral icterus.  Neck: Normal range of motion.  Cardiovascular: Regular rhythm and intact distal pulses. Tachycardia present.  Pulses:      Radial pulses are 1+ on the right side, and 1+ on the left side.       Dorsalis pedis pulses are 1+ on the right side, and 1+ on the left side.  Pulmonary/Chest: Tachypnea noted. No respiratory distress. She has decreased breath sounds.  Equal chest expansion  Abdominal: Soft. Bowel sounds are normal. She exhibits no mass. There is no tenderness. There is no rigidity, no rebound and no guarding.  Musculoskeletal: Normal range of motion. She exhibits no edema.  Bilateral feet turned outward Pressure ulcer of the right heel without surrounding erythema, induration or purulent drainage  Neurological: She appears lethargic. GCS eye subscore is 3. GCS verbal subscore is 5. GCS motor subscore is 6.  Patient lethargic but does answer questions and follow commands Strength 5/5 in the bilateral upper extremities Patient unable to lift her legs off the bed -daughter reports this is normal No  facial droop  Skin: Skin is warm and dry. She is not diaphoretic.  Hot to touch  Psychiatric: She has a normal mood and affect.  Nursing note and vitals  reviewed.    ED Treatments / Results  Labs (all labs ordered are listed, but only abnormal results are displayed) Labs Reviewed  COMPREHENSIVE METABOLIC PANEL - Abnormal; Notable for the following components:      Result Value   Potassium 3.2 (*)    Glucose, Bld 129 (*)    Albumin 2.8 (*)    ALT 10 (*)    GFR calc non Af Amer 53 (*)    All other components within normal limits  URINALYSIS, COMPLETE (UACMP) WITH MICROSCOPIC - Abnormal; Notable for the following components:   APPearance CLOUDY (*)    Hgb urine dipstick MODERATE (*)    Protein, ur 30 (*)    Leukocytes, UA LARGE (*)    Bacteria, UA MANY (*)    Squamous Epithelial / LPF 0-5 (*)    All other components within normal limits  CBC WITH DIFFERENTIAL/PLATELET - Abnormal; Notable for the following components:   WBC 22.9 (*)    RBC 3.63 (*)    Hemoglobin 10.5 (*)    HCT 31.4 (*)    Neutro Abs 21.4 (*)    All other components within normal limits  I-STAT CG4 LACTIC ACID, ED - Abnormal; Notable for the following components:   Lactic Acid, Venous 2.56 (*)    All other components within normal limits  CULTURE, BLOOD (ROUTINE X 2)  CULTURE, BLOOD (ROUTINE X 2)  URINE CULTURE  I-STAT CG4 LACTIC ACID, ED    EKG EKG Interpretation  Date/Time:  Sunday April 17 2017 06:27:07 EDT Ventricular Rate:  175 PR Interval:    QRS Duration: 76 QT Interval:  247 QTC Calculation: 422 R Axis:   63 Text Interpretation:  Afib with RVR Multiple premature complexes, vent & supraven Low voltage, extremity leads Repolarization abnormality, prob rate related diffuse ST dpressions Confirmed by Glynn Octave [id number]) on 04/17/2017 6:51:19 AM   Radiology Dg Thoracic Spine 2 View  Result Date: 04/17/2017 CLINICAL DATA:  Pain after falling EXAM: THORACIC SPINE 2 VIEWS COMPARISON:  None.  FINDINGS: Multilevel thoracic vertebral body height loss. No acute fracture. Normal alignment. IMPRESSION: No acute fracture or static subluxation of the thoracic spine. Electronically Signed   By: Deatra Robinson M.D.   On: 04/17/2017 06:30   Dg Lumbar Spine Complete  Result Date: 04/17/2017 CLINICAL DATA:  Fall with back pain EXAM: LUMBAR SPINE - COMPLETE 4+ VIEW COMPARISON:  Lumbar spine MRI 09/01/2015 FINDINGS: Posterior fusion hardware at L3-L5 is unchanged alignment. No abnormal lucency. No acute fracture. Multilevel upper lumbar vertebral body height loss is unchanged. IMPRESSION: No acute fracture of the lumbar spine. Electronically Signed   By: Deatra Robinson M.D.   On: 04/17/2017 06:32    Procedures .Critical Care Performed by: Dierdre Forth, PA-C Authorized by: Dierdre Forth, PA-C   Critical care provider statement:    Critical care time (minutes):  45   Critical care time was exclusive of:  Separately billable procedures and treating other patients   Critical care was necessary to treat or prevent imminent or life-threatening deterioration of the following conditions:  Circulatory failure and sepsis   Critical care was time spent personally by me on the following activities:  Development of treatment plan with patient or surrogate, discussions with consultants, evaluation of patient's response to treatment, examination of patient, obtaining history from patient or surrogate, ordering and performing treatments and interventions, ordering and review of laboratory studies, ordering and review of radiographic studies, pulse oximetry, re-evaluation of patient's condition and review of old  charts   I assumed direction of critical care for this patient from another provider in my specialty: no     (including critical care time)  Medications Ordered in ED Medications  vancomycin (VANCOCIN) 2,000 mg in sodium chloride 0.9 % 500 mL IVPB (2,000 mg Intravenous New Bag/Given 04/17/17  0818)  sodium chloride 0.9 % injection (has no administration in time range)  iopamidol (ISOVUE-370) 76 % injection (has no administration in time range)  iopamidol (ISOVUE-370) 76 % injection 100 mL (has no administration in time range)  fentaNYL (SUBLIMAZE) injection 50 mcg (50 mcg Intravenous Given 04/17/17 0633)  sodium chloride 0.9 % bolus 1,000 mL (1,000 mLs Intravenous New Bag/Given 04/17/17 2426)    And  sodium chloride 0.9 % bolus 1,000 mL (0 mLs Intravenous Stopped 04/17/17 0738)    And  sodium chloride 0.9 % bolus 1,000 mL (1,000 mLs Intravenous New Bag/Given 04/17/17 0730)  levofloxacin (LEVAQUIN) IVPB 750 mg (0 mg Intravenous Stopped 04/17/17 0813)  aztreonam (AZACTAM) 2 GM IVPB (0 g Intravenous Stopped 04/17/17 0647)  acetaminophen (TYLENOL) suppository 650 mg (650 mg Rectal Given 04/17/17 0647)  hydrocortisone sodium succinate (SOLU-CORTEF) 100 MG injection 100 mg (100 mg Intravenous Given 04/17/17 0807)  diltiazem (CARDIZEM) 100 mg in dextrose 5% (1 mg/mL) infusion (5 mg/hr Intravenous New Bag/Given 04/17/17 0813)     Initial Impression / Assessment and Plan / ED Course  I have reviewed the triage vital signs and the nursing notes.  Pertinent labs & imaging results that were available during my care of the patient were reviewed by me and considered in my medical decision making (see chart for details).  Clinical Course as of Apr 18 819  Sun Apr 17, 2017  0616 Tachypnea.  Patient distressed and moaning  Resp(!): 24 [HM]  0616 Hypertensive.  BP(!): 173/89 [HM]  0646 Elevated  Lactic Acid, Venous(!!): 2.56 [HM]  0646 hypoxic  SpO2(!): 89 % [HM]  0646 Tachycardia; now a-fib with RVR  Pulse Rate(!): 173 [HM]  0727 Slight improvement  Pulse Rate(!): 158 [HM]  0727 Pt is also on chronic steroids  WBC(!): 22.9 [HM]  0728 Hypokalemia  Potassium(!): 3.2 [HM]  0728 normal  Creatinine: 1.00 [HM]    Clinical Course User Index [HM] Lakeisha Waldrop, Dahlia Client, PA-C     Evaluated on arrival.  She is writhing in bed complaining of back pain but is alert and talkative.  No tachycardia on initial evaluation.  Patient warm to touch but does not feel febrile.  Minimal exam due to significant pain.  Pain medication ordered along with T-spine and L-spine films.  Soon after, RN reports that patient is now tachycardic and febrile.  On reassessment patient is significantly lethargic, tachycardic into the 170s and febrile to 103.  Additionally, patient hypoxic into the 80s.  Supplemental oxygen applied.  EKG shows A. fib with RVR.  Code sepsis initiated, fluid boluses given along with antibiotics.  Patient's blood pressure decreased from 180 systolic to 112.  She is now altered.  Abdomen is soft and nontender.  No signs of cellulitis however she has been in too much pain to roll to assess her back.    Pt's lactic acid is elevated.  Additionally she has a leukocytosis either from her illness or from her chronic steroids.  Will stress dose steroids.  Urine has moderate hemoglobin and large leukocytes without nitrites.  She numerous to count white blood cells.  Concern for possible infected stone.  Her tachycardia persists. Cardiazem initiated.  The patient  was discussed with and seen by Dr. Manus Gunning who agrees with the treatment plan.   8:18 AM At shift change, patient transferred to Olivia Canter, MD who will follow and admit.  Chest and abdominal CT scan pending.  Final Clinical Impressions(s) / ED Diagnoses   Final diagnoses:  Acute midline back pain, unspecified back location  Sepsis, due to unspecified organism [facility])  Atrial fibrillation with rapid ventricular response (HCC)  Hypoxia  Fever, unspecified fever cause    ED Discharge Orders    None       Mardene Sayer Boyd Kerbs 04/17/17 2025    Glynn Octave, MD 04/17/17 [phone]

## 2017-04-17 NOTE — ED Notes (Signed)
Blood culture number one drawn at 0615, blood culture number 2 drawn at 0630.

## 2017-04-17 NOTE — ED Provider Notes (Signed)
Patient care assumed at 0700.    Pt here with sudden onset back pain, fever.  She is ill appearing on exam with tachycardia, tachypnea, severe pain.  She was treated with IV abx for presumed sepsis as well as IVF, hydrocortisone for possible adrenal insufficiency given chronic steroid use.  During ED stay she developed afib with RVR (hx/o same) - treated with diltiazem for rate control.  CTA obtained with no evidence of dissection but does demonstrate right sided pelvic changes concerning for osteomyelitis/septic bursitis.  On physical exam she has tenderness over her upper lumbar spine.  There is swelling and edema over the right posteriolateral hip without erythema or local tenderness, skin intact and wnl.  She has no hip tenderness and can range the hip and RLE.  Discussed the case with on call physician for [facility] - recommends IR eval for possible joint aspiration.  Discussed with Hospitalist regarding admission for further treatment.  Patient and daughter updated of findings of studies and critical nature of illness.  Patient and daughter in agreement with admission for further treatment.   CRITICAL CARE Performed by: Tilden Fossa   Total critical care time: 50 minutes  Critical care time was exclusive of separately billable procedures and treating other patients.  Critical care was necessary to treat or prevent imminent or life-threatening deterioration.  Critical care was time spent personally by me on the following activities: development of treatment plan with patient and/or surrogate as well as nursing, discussions with consultants, evaluation of patient's response to treatment, examination of patient, obtaining history from patient or surrogate, ordering and performing treatments and interventions, ordering and review of laboratory studies, ordering and review of radiographic studies, pulse oximetry and re-evaluation of patient's condition.    Tilden Fossa, MD 04/17/17  2033

## 2017-04-17 NOTE — Sedation Documentation (Signed)
New tray for second drain setup

## 2017-04-17 NOTE — H&P (Signed)
Chief Complaint: Pelvic fluid collection  Referring Physician(s): Ankit Joline Maxcy MD  Supervising Physician: Richarda Overlie  Patient Status: [facility]pt  History of Present Illness: Andrea Yu is a 77 y.o. female with medical history significant of atrial fibrillation, psoriatic arthritis, low back pain, peripheral neuropathy, osteoarthritis and multiple joint deformities, and type 2 diabetes mellitus.  She was brought to the hospital for evaluation of low back pain and altered mental status.   Over the last couple of days she has noticed her lower back pain has been somewhat worsening and becoming increasingly confused at home.    Denies any head trauma, abdominal pain, nausea, vomiting, dysuria or any other complaints.    In the ER initially patient was confused and was only able to tell us her name.  She was found to be in atrial fibrillation with RVR and she has been started on Cardizem drip.  She has elevated WBC at 22.9.  UA was suggestive of urinary tract infection.    She has been started on vancomycin and aztreonam.    CT of the abdomen and pelvis showed pelvic fluid collection especially on the right, right trochanteric bursitis, right ischial spine concerning for osteomyelitis and possible overlying abscess.    Orthopedic was consulted, Dr. Cleophas Dunker who recommended consulting our service for drain placement.  During time of my exam she was not confused and answered all questions appropriately.   Past Medical History:  Diagnosis Date  . A-fib (HCC)    "just after knee 2nd OR" (11/07/2012)  . Anemia   . Arthritis    RA , SACROTIC ARTHRITIS  . Complication of anesthesia    found to be in afib when she woke up  . Dysrhythmia   . GERD (gastroesophageal reflux disease)   . Head injury, closed, with concussion   . History of blood transfusion    "after 1 of my knee ORs and today" (11/07/2012)  . Hypertension   . Peripheral neuropathy    "from back OR in  2005; in hands and feet" (11/07/2012)  . Psoriatic arthritis (HCC)    on Prednisone Rx    Past Surgical History:  Procedure Laterality Date  . ANKLE FUSION Right 01/07/2016   Procedure: Right Tibiocalcaneal Fusion;  Surgeon: Nadara Mustard, MD;  Location: [facility] OR;  Service: Orthopedics;  Laterality: Right;  . BACK SURGERY    . CATARACT EXTRACTION W/ INTRAOCULAR LENS  IMPLANT, BILATERAL Bilateral 2007  . CHOLECYSTECTOMY  2000's  . COLONOSCOPY N/A 11/10/2012   Procedure: COLONOSCOPY;  Surgeon: Theda Belfast, MD;  Location: [facility] ENDOSCOPY;  Service: Endoscopy;  Laterality: N/A;  . ESOPHAGOGASTRODUODENOSCOPY N/A 11/10/2012   Procedure: ESOPHAGOGASTRODUODENOSCOPY (EGD);  Surgeon: Theda Belfast, MD;  Location[facility] ENDOSCOPY;  Service: Endoscopy;  Laterality: N/A;  . EYE SURGERY    . JOINT REPLACEMENT    . POSTERIOR LUMBAR FUSION  2005   "got a rod and 4 pins in there" (11/07/2012)  . REPLACEMENT TOTAL KNEE BILATERAL Bilateral 2007  . TRANSTHORACIC ECHOCARDIOGRAM  05/24/2006  . TUBAL LIGATION  1974  . VAGINAL HYSTERECTOMY  1983    Allergies: Penicillins; Prolia [denosumab]; and Fosamax [alendronate sodium]  Medications: Prior to Admission medications   Medication Sig Start Date End Date Taking? Authorizing Provider  aspirin EC 81 MG tablet Take 81 mg by mouth every evening.   Yes [provider]  BIOTIN 5000 PO Take 5,000 mg by mouth at bedtime.    Yes [provider]  DULoxetine (CYMBALTA) 30 MG capsule Take 30 mg by mouth daily. Take along with 60 mg capsule=90 mg 03/24/17  Yes [provider]  DULoxetine (CYMBALTA) 60 MG capsule Take 60 mg by mouth daily. Take along with 30mg  capsule to make a total of 90mg    Yes [provider]  furosemide (LASIX) 80 MG tablet Take 80 mg by mouth.   Yes [provider]  gabapentin (NEURONTIN) 600 MG tablet Take 600-1,200 mg by mouth 2 (two) times daily. 1 tablet in morning and 2 tablets at night   Yes [provider]  HYDROcodone-acetaminophen (NORCO/VICODIN) 5-325 MG per tablet Take 1-2 tablets by mouth every 6 (six) hours as needed (for pain (scheduled 1 tablet at bedtime)).    Yes [provider]  Magnesium 250 MG TABS Take 1 tablet by mouth every evening.    Yes [provider]  Multiple Vitamin (MULTIVITAMIN WITH MINERALS) TABS Take 1 tablet by mouth daily.   Yes [provider]  omeprazole (PRILOSEC) 20 MG capsule Take 20 mg by mouth every evening. 11/08/15  Yes [provider]  potassium chloride SA (K-DUR,KLOR-CON) 20 MEQ tablet Take 20 mEq by mouth 2 (two) times daily.    Yes [provider]  predniSONE (DELTASONE) 5 MG tablet Take 5 mg by mouth daily.  08/12/12  Yes Tisovec, 11/10/15, MD  acetaminophen (TYLENOL) 325 MG tablet Take 2 tablets (650 mg total) by mouth every 4 (four) hours as needed. Patient taking differently: Take 325 mg by mouth every 6 (six) hours as needed (for pain.).  11/11/12   Adelfa Koh, MD  Apremilast (OTEZLA) 30 MG TABS Take 30 mg by mouth 2 (two) times daily.    [provider]  DILT-XR 180 MG 24 hr capsule Take 180 mg by mouth at bedtime. 04/07/17   [provider]  FORTEO 600 MCG/2.4ML SOLN Inject 20 mcg into the skin daily.  03/26/17   [provider]  furosemide (LASIX) 40 MG tablet TAKE 2 TABLETS BY MOUTH  DAILY Patient not taking: Reported on 04/17/2017 10/20/16   Croitoru, Mihai, MD  losartan (COZAAR) 100 MG tablet Take 100 mg by mouth daily.  03/09/17   [provider]  metoprolol succinate (TOPROL-XL) 100 MG 24 hr tablet TAKE 1 TABLET BY MOUTH  DAILY 02/21/17   Croitoru, Mihai, MD  mupirocin ointment (BACTROBAN) 2 % Apply 1 application topically 2 (two) times daily. Apply to the affected area 2 times a day Patient not taking: Reported on 04/17/2017 01/29/16   04/19/2017, MD  PROAIR HFA 108 [phone] BASE) MCG/ACT inhaler Inhale 1-2 puffs into the lungs every 6 (six) hours as  needed for wheezing or shortness of breath.  02/06/13   [provider]  silver sulfADIAZINE (SILVADENE) 1 % cream Apply 1 application topically daily. Patient not taking: Reported on 04/17/2017 11/11/16   04/19/2017, NP  Vitamin D, Ergocalciferol, (DRISDOL) 50000 UNITS CAPS Take 50,000 Units by mouth every Tuesday.     [provider]     Family History  Problem Relation Age of Onset  . Heart attack Father        1951-deceased   . Liver disease Mother   . Aneurysm Son 92       Deceased     Social History   Socioeconomic History  . Marital status: Married    Spouse name: Not on file  . Number of children: Not on file  . Years of  education: Not on file  . Highest education level: Not on file  Occupational History  . Not on file  Social Needs  . Financial resource strain: Not on file  . Food insecurity:    Worry: Not on file    Inability: Not on file  . Transportation needs:    Medical: Not on file    Non-medical: Not on file  Tobacco Use  . Smoking status: Former Smoker    Packs/day: 0.50    Years: 30.00    Pack years: 15.00    Types: Cigarettes    Last attempt to quit: 08/09/1981    Years since quitting: 35.7  . Smokeless tobacco: Never Used  Substance and Sexual Activity  . Alcohol use: No  . Drug use: No  . Sexual activity: Not Currently  Lifestyle  . Physical activity:    Days per week: Not on file    Minutes per session: Not on file  . Stress: Not on file  Relationships  . Social connections:    Talks on phone: Not on file    Gets together: Not on file    Attends religious service: Not on file    Active member of club or organization: Not on file    Attends meetings of clubs or organizations: Not on file    Relationship status: Not on file  Other Topics Concern  . Not on file  Social History Narrative  . Not on file    Review of Systems: A 12 point ROS discussed and pertinent positives are indicated in the HPI above.  All other  systems are negative. Review of Systems  Vital Signs: BP 105/68 (BP Location: Right Arm)   Pulse 94   Temp 98.9 F (37.2 C) (Oral)   Resp 18   Ht 5\' 3"  (1.6 m)   Wt 185 lb (83.9 kg)   SpO2 99%   BMI 32.77 kg/m   Physical Exam  Constitutional: She is oriented to person, place, and time. She appears well-developed.  HENT:  Head: Normocephalic and atraumatic.  Eyes: EOM are normal.  Neck: Normal range of motion.  Cardiovascular:  Currently in Afib - on Cardizem drip  Pulmonary/Chest: Effort normal and breath sounds normal. No respiratory distress.  Abdominal: She exhibits no distension.  Musculoskeletal: Normal range of motion.       Back:  Palpable nodule, No open wounds seen  Neurological: She is alert and oriented to person, place, and time.  Skin: Skin is warm and dry.  Psychiatric: She has a normal mood and affect. Her behavior is normal. Judgment and thought content normal.  Vitals reviewed.   Imaging: Dg Chest 2 View  Result Date: 04/17/2017 CLINICAL DATA:  Fever and severe back pain. EXAM: CHEST - 2 VIEW COMPARISON:  11/06/2012. FINDINGS: Poor inspiration. Normal sized heart. Clear lungs. Mild scoliosis. Lower thoracic spine degenerative changes. Superior bright creation of both humeral heads with bony remodeling and bilateral glenohumeral degenerative changes. IMPRESSION: 1. No acute abnormality. 2. Large bilateral chronic rotator cuff tears and bilateral shoulder degenerative changes. Electronically Signed   By: 11/08/2012 M.D.   On: 04/17/2017 08:40   Dg Thoracic Spine 2 View  Result Date: 04/17/2017 CLINICAL DATA:  Pain after falling EXAM: THORACIC SPINE 2 VIEWS COMPARISON:  None. FINDINGS: Multilevel thoracic vertebral body height loss. No acute fracture. Normal alignment. IMPRESSION: No acute fracture or static subluxation of the thoracic spine. Electronically Signed   By: 04/19/2017 M.D.   On: 04/17/2017  06:30   Dg Lumbar Spine Complete  Result Date:  04/17/2017 CLINICAL DATA:  Fall with back pain EXAM: LUMBAR SPINE - COMPLETE 4+ VIEW COMPARISON:  Lumbar spine MRI 09/01/2015 FINDINGS: Posterior fusion hardware at L3-L5 is unchanged alignment. No abnormal lucency. No acute fracture. Multilevel upper lumbar vertebral body height loss is unchanged. IMPRESSION: No acute fracture of the lumbar spine. Electronically Signed   By: Deatra Robinson M.D.   On: 04/17/2017 06:32   Ct Angio Chest/abd/pel For Dissection W And/or Wo Contrast  Result Date: 04/17/2017 CLINICAL DATA:  Larey Seat a couple days ago at home, tonight developed back spasms and pain, generalized acute abdominal pain, history GERD, hypertension paroxysmal atrial fibrillation EXAM: CT ANGIOGRAPHY CHEST, ABDOMEN AND PELVIS TECHNIQUE: Multidetector CT imaging through the chest, abdomen and pelvis was performed using the standard protocol during bolus administration of intravenous contrast. Multiplanar reconstructed images and MIPs were obtained and reviewed to evaluate the vascular anatomy. CONTRAST:  100 ML ISOVUE-370 IOPAMIDOL (ISOVUE-370) INJECTION 76% IV. No oral contrast. COMPARISON:  CT abdomen and pelvis 03/29/2013 FINDINGS: CTA CHEST FINDINGS Cardiovascular: Scattered atherosclerotic calcifications aorta, proximal great vessels and coronary arteries. No evidence of intramural hematoma on precontrast imaging. Normal aortic enhancement following contrast without aneurysm or dissection. No pericardial effusion. Pulmonary arteries grossly patent. Mediastinum/Nodes: Esophagus unremarkable. Base of cervical region normal appearance. No thoracic adenopathy. Lungs/Pleura: Minimal dependent atelectasis in the posterior lungs. Lungs otherwise clear. No acute infiltrate, pleural effusion, or pneumothorax. Tiny calcified pulmonary granuloma RIGHT middle lobe image 51. Musculoskeletal: Scattered degenerative disc disease changes thoracic spine Review of the MIP images confirms the above findings. CTA ABDOMEN AND  PELVIS FINDINGS VASCULAR Aorta: Atherosclerotic calcification and tortuosity of abdominal aorta. No evidence of aneurysm or dissection. Celiac: Mild atherosclerotic plaque formation at origin of celiac artery, estimated less than 50% narrowed SMA: Significant atherosclerotic plaque at origin of SMA, with greater than 50% narrowing Renals: Atherosclerotic plaque at origins of BILATERAL renal arteries probably greater than 50% bilaterally IMA: IMA patent Inflow: Scattered calcified plaques throughout the iliac and visualized femoral systems without aneurysm Veins: Unopacified at CTA imaging.  Scattered pelvic phleboliths. Review of the MIP images confirms the above findings. NON-VASCULAR Hepatobiliary: Gallbladder surgically absent. Mild fatty infiltration of liver without mass. Pancreas: Normal appearance Spleen: Normal appearance Adrenals/Urinary Tract: Adrenal glands, kidneys, ureters, and bladder normal appearance Stomach/Bowel: Appendix not visualized but no pericecal inflammatory process seen. Sigmoid diverticulosis without evidence of diverticulitis. Stomach and bowel loops otherwise unremarkable. Lymphatic: No adenopathy Reproductive: Uterus surgically absent with normal sized ovaries Other: Small umbilical and supraumbilical ventral hernias containing fat. No free intraperitoneal air or fluid. Musculoskeletal: Bones demineralized with evidence of prior lumbar fusion L3-L5. Mild chronic anterolisthesis at L4-L5. Multilevel degenerative disc disease changes of the thoracolumbar spine. Fluid collection identified lateral to the RIGHT hip joint overlying the femoral neck and greater trochanter, maximal dimensions of 6.6 x 4.2 x 7.2 cm. A separate fluid collection is identified posterior inferior to the RIGHT ischium at the inferior RIGHT buttock, 8.8 x 5.2 cm in axial dimensions image 185, extending at least 4.4 cm in craniocaudal length beyond the inferior extent of exam. Overlying skin thickening and  subcutaneous infiltration at the posterior collection. Questionable bone destruction at the RIGHT ischium raises question of decubitus ulcer with potential osteomyelitis. No definite hip joint effusion. Review of the MIP images confirms the above findings. IMPRESSION: Scattered atherosclerotic calcifications involving the thoracic and abdominal aorta, coronary arteries, and abdominal visceral artery origins. No evidence of aortic  aneurysm or dissection. 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 No acute intrathoracic abnormalities. Two RIGHT pelvic fluid collections are identified: 6.6 x 4.2 x 7.2 cm lateral to and abutting the RIGHT greater trochanter question trochanteric bursitis; and a second collection posteriorly overlying and extending caudal to the RIGHT ischial spine 8.8 x 5.2 x >4.4 cm in size, with overlying skin thickening, subcutaneous infiltration, and potential mild ischial destruction, highly suspicious for osteomyelitis and overlying abscess, potentially related to decubitus ulcer? Electronically Signed   By: Ulyses Southward M.D.   On: 04/17/2017 09:32    Labs:  CBC: Recent Labs    04/17/17 0634  WBC 22.9*  HGB 10.5*  HCT 31.4*  PLT 314    COAGS: No results for input(s): INR, APTT in the last 8760 hours.  BMP: Recent Labs    04/17/17 0634  NA 137  K 3.2*  CL 101  CO2 22  GLUCOSE 129*  BUN 12  CALCIUM 8.9  CREATININE 1.00  GFRNONAA 53*  GFRAA >60    LIVER FUNCTION TESTS: Recent Labs    04/17/17 0634  BILITOT 0.9  AST 21  ALT 10*  ALKPHOS 119  PROT 6.5  ALBUMIN 2.8*    TUMOR MARKERS: No results for input(s): AFPTM, CEA, CA199, CHROMGRNA in the last 8760 hours.  Assessment and Plan:  Two RIGHT pelvic fluid collections : 6.6 x 4.2 x 7.2 cm lateral to and abutting the RIGHT greater trochanter question trochanteric bursitis; and a second collection posteriorly overlying and extending caudal to the RIGHT ischial spine 8.8 x 5.2 x >4.4 cm in size,  with overlying skin thickening, subcutaneous infiltration, and potential mild ischial destruction, highly suspicious for osteomyelitis and overlying abscess.  No decubitus ulcer seen.  Will proceed with image guided drains x 2.  Risks and benefits discussed with the patient including bleeding, infection, damage to adjacent structures, bowel perforation/fistula connection, and sepsis.  All of the patient's questions were answered, patient is agreeable to proceed. Consent signed and in chart.   Thank you for this interesting consult.  I greatly enjoyed meeting Andrea Yu and look forward to participating in their care.  A copy of this report was sent to the requesting provider on this date.  Electronically Signed: Gwynneth Macleod, PA-C 04/17/2017, 11:21 AM   I spent a total of 40 Minutes in face to face in clinical consultation, greater than 50% of which was counseling/coordinating care for drain placement.

## 2017-04-17 NOTE — ED Notes (Signed)
Spoke with ED Provider about pt pain. RN will check BP when pt returns from X-ray and report back to ED Provider with BP and attain order for pain medication.

## 2017-04-17 NOTE — ED Notes (Signed)
ED TO INPATIENT HANDOFF REPORT  Name/Age/Gender Andrea Yu 77 y.o. female  Code Status    Code Status Orders  (From admission, onward)        Start     Ordered   04/17/17 1035  Full code  Continuous     04/17/17 1040    Code Status History    Date Active Date Inactive Code Status Order ID Comments User Context   01/07/2016 1224 01/09/2016 1519 Full Code [id number]  Newt Minion, MD Inpatient   09/14/2014 0404 09/16/2014 1813 Full Code [id number]  Theressa Millard, MD Inpatient   11/06/2012 1730 11/11/2012 1419 DNR [id number]  Cristal Ford, DO Inpatient   11/06/2012 1453 11/06/2012 1730 Full Code [id number]  Donita Brooks, NP ED      Home/SNF/Other Home  Chief Complaint No admission diagnoses are documented for this encounter.  Level of Care/Admitting Diagnosis ED Disposition    ED Disposition Condition [facility] Area: [place] [[id number]]  Level of Care: Stepdown [14]  Admit to SDU based on following criteria: Hemodynamic compromise or significant risk of instability:  Patient requiring short term acute titration and management of vasoactive drips, and invasive monitoring (i.e., CVP and Arterial line).  Diagnosis: Atrial fibrillation with RVR [facility]) [id number]  Admitting Physician: Gerlean Ren [facility] [[id number]]  Attending Physician: Gerlean Ren [facility] [[id number]]  Estimated length of stay: 3 - 4 days  Certification:: I certify this patient will need inpatient services for at least 2 midnights  PT Class (Do Not Modify): Inpatient [101]  PT Acc Code (Do Not Modify): Private [1]       Medical History Past Medical History:  Diagnosis Date  . A-fib ([place])    "just after knee 2nd OR" (11/07/2012)  . Anemia   . Arthritis    RA , SACROTIC ARTHRITIS  . Complication of anesthesia    found to be in afib when she woke up  . Dysrhythmia   . GERD (gastroesophageal reflux disease)   . Head injury, closed, with concussion   . History  of blood transfusion    "after 1 of my knee ORs and today" (11/07/2012)  . Hypertension   . Peripheral neuropathy    "from back OR in 2005; in hands and feet" (11/07/2012)  . Psoriatic arthritis ([place])    on Prednisone Rx    Allergies Allergies  Allergen Reactions  . Penicillins Anaphylaxis    THROAT SWELLING  Has patient had a PCN reaction causing immediate rash, facial/tongue/throat swelling, SOB or lightheadedness with hypotension:  # # YES # #  Has patient had a PCN reaction causing severe rash involving mucus membranes or skin necrosis: # # # YES # # # Has patient had a PCN reaction that required hospitalization:No Has patient had a PCN reaction occurring within the last 10 years:  # # YES # #  If all of the above answers are "NO", then may proceed with Cephalosporin use.   [place] [Denosumab] Other (See Comments)    Severe Pain in the groin area.  Marland Kitchen Fosamax [Alendronate Sodium]     UNSPECIFIED REACTION     IV Location/Drains/Wounds Patient Lines/Drains/Airways Status   Active Line/Drains/Airways    Name:   Placement date:   Placement time:   Site:   Days:   Peripheral IV 04/17/17 Left Antecubital   04/17/17    0741    Antecubital   less than 1   Peripheral IV 04/17/17 Left  Wrist   04/17/17    0742    Wrist   less than 1   External Urinary Catheter   04/17/17    0701    -   less than 1          Labs/Imaging Results for orders placed or performed during the hospital encounter of 04/17/17 (from the past 48 hour(s))  I-Stat CG4 Lactic Acid, ED  (not at  [facility])     Status: Abnormal   Collection Time: 04/17/17  6:33 AM  Result Value Ref Range   Lactic Acid, Venous 2.56 (HH) 0.5 - 1.9 mmol/L   Comment NOTIFIED PHYSICIAN   Comprehensive metabolic panel     Status: Abnormal   Collection Time: 04/17/17  6:34 AM  Result Value Ref Range   Sodium 137 135 - 145 mmol/L   Potassium 3.2 (L) 3.5 - 5.1 mmol/L   Chloride 101 101 - 111 mmol/L   CO2 22 22 - 32 mmol/L   Glucose,  Bld 129 (H) 65 - 99 mg/dL   BUN 12 6 - 20 mg/dL   Creatinine, Ser 1.00 0.44 - 1.00 mg/dL   Calcium 8.9 8.9 - 10.3 mg/dL   Total Protein 6.5 6.5 - 8.1 g/dL   Albumin 2.8 (L) 3.5 - 5.0 g/dL   AST 21 15 - 41 U/L   ALT 10 (L) 14 - 54 U/L   Alkaline Phosphatase 119 38 - 126 U/L   Total Bilirubin 0.9 0.3 - 1.2 mg/dL   GFR calc non Af Amer 53 (L) >60 mL/min   GFR calc Af Amer >60 >60 mL/min    Comment: (NOTE) The eGFR has been calculated using the CKD EPI equation. This calculation has not been validated in all clinical situations. eGFR's persistently <60 mL/min signify possible Chronic Kidney Disease.    Anion gap 14 5 - 15    Comment: Performed at [facility], [place] [address], [place], [place] [address]  CBC with Differential     Status: Abnormal   Collection Time: 04/17/17  6:34 AM  Result Value Ref Range   WBC 22.9 (H) 4.0 - 10.5 K/uL   RBC 3.63 (L) 3.87 - 5.11 MIL/uL   Hemoglobin 10.5 (L) 12.0 - 15.0 g/dL   HCT 31.4 (L) 36.0 - 46.0 %   MCV 86.5 78.0 - 100.0 fL   MCH 28.9 26.0 - 34.0 pg   MCHC 33.4 30.0 - 36.0 g/dL   RDW 14.3 11.5 - 15.5 %   Platelets 314 150 - 400 K/uL   Neutrophils Relative % 94 %   Neutro Abs 21.4 (H) 1.7 - 7.7 K/uL   Lymphocytes Relative 3 %   Lymphs Abs 0.7 0.7 - 4.0 K/uL   Monocytes Relative 3 %   Monocytes Absolute 0.7 0.1 - 1.0 K/uL   Eosinophils Relative 0 %   Eosinophils Absolute 0.0 0.0 - 0.7 K/uL   Basophils Relative 0 %   Basophils Absolute 0.0 0.0 - 0.1 K/uL    Comment: Performed at [facility], [place] [address] [place], [place] [address]  Urinalysis, Complete w Microscopic     Status: Abnormal   Collection Time: 04/17/17  6:49 AM  Result Value Ref Range   Color, Urine YELLOW YELLOW   APPearance CLOUDY (A) CLEAR   Specific Gravity, Urine 1.015 1.005 - 1.030   pH 5.0 5.0 - 8.0   Glucose, UA NEGATIVE NEGATIVE mg/dL   Hgb urine dipstick MODERATE (A) NEGATIVE   Bilirubin  Urine NEGATIVE NEGATIVE    Ketones, ur NEGATIVE NEGATIVE mg/dL   Protein, ur 30 (A) NEGATIVE mg/dL   Nitrite NEGATIVE NEGATIVE   Leukocytes, UA LARGE (A) NEGATIVE   RBC / HPF 6-30 0 - 5 RBC/hpf   WBC, UA TOO NUMEROUS TO COUNT 0 - 5 WBC/hpf   Bacteria, UA MANY (A) NONE SEEN   Squamous Epithelial / LPF 0-5 (A) NONE SEEN   WBC Clumps PRESENT    Mucus PRESENT    Hyaline Casts, UA PRESENT     Comment: Performed at [facility], [place] [address] [place], [place] [address]  I-Stat CG4 Lactic Acid, ED  (not at  [facility])     Status: None   Collection Time: 04/17/17  8:53 AM  Result Value Ref Range   Lactic Acid, Venous 1.33 0.5 - 1.9 mmol/L   Dg Chest 2 View  Result Date: 04/17/2017 CLINICAL DATA:  Fever and severe back pain. EXAM: CHEST - 2 VIEW COMPARISON:  11/06/2012. FINDINGS: Poor inspiration. Normal sized heart. Clear lungs. Mild scoliosis. Lower thoracic spine degenerative changes. Superior bright creation of both humeral heads with bony remodeling and bilateral glenohumeral degenerative changes. IMPRESSION: 1. No acute abnormality. 2. Large bilateral chronic rotator cuff tears and bilateral shoulder degenerative changes. Electronically Signed   By: Claudie Revering M.D.   On: 04/17/2017 08:40   Dg Thoracic Spine 2 View  Result Date: 04/17/2017 CLINICAL DATA:  Pain after falling EXAM: THORACIC SPINE 2 VIEWS COMPARISON:  None. FINDINGS: Multilevel thoracic vertebral body height loss. No acute fracture. Normal alignment. IMPRESSION: No acute fracture or static subluxation of the thoracic spine. Electronically Signed   By: Ulyses Jarred M.D.   On: 04/17/2017 06:30   Dg Lumbar Spine Complete  Result Date: 04/17/2017 CLINICAL DATA:  Fall with back pain EXAM: LUMBAR SPINE - COMPLETE 4+ VIEW COMPARISON:  Lumbar spine MRI 09/01/2015 FINDINGS: Posterior fusion hardware at L3-L5 is unchanged alignment. No abnormal lucency. No acute fracture. Multilevel upper lumbar vertebral body height loss is unchanged. IMPRESSION:  No acute fracture of the lumbar spine. Electronically Signed   By: Ulyses Jarred M.D.   On: 04/17/2017 06:32   Ct Angio Chest/abd/pel For Dissection W And/or Wo Contrast  Result Date: 04/17/2017 CLINICAL DATA:  [place] a couple days ago at home, tonight developed back spasms and pain, generalized acute abdominal pain, history GERD, hypertension paroxysmal atrial fibrillation EXAM: CT ANGIOGRAPHY CHEST, ABDOMEN AND PELVIS TECHNIQUE: Multidetector CT imaging through the chest, abdomen and pelvis was performed using the standard protocol during bolus administration of intravenous contrast. Multiplanar reconstructed images and MIPs were obtained and reviewed to evaluate the vascular anatomy. CONTRAST:  100 ML ISOVUE-370 IOPAMIDOL (ISOVUE-370) INJECTION 76% IV. No oral contrast. COMPARISON:  CT abdomen and pelvis 03/29/2013 FINDINGS: CTA CHEST FINDINGS Cardiovascular: Scattered atherosclerotic calcifications aorta, proximal great vessels and coronary arteries. No evidence of intramural hematoma on precontrast imaging. Normal aortic enhancement following contrast without aneurysm or dissection. No pericardial effusion. Pulmonary arteries grossly patent. Mediastinum/Nodes: Esophagus unremarkable. Base of cervical region normal appearance. No thoracic adenopathy. Lungs/Pleura: Minimal dependent atelectasis in the posterior lungs. Lungs otherwise clear. No acute infiltrate, pleural effusion, or pneumothorax. Tiny calcified pulmonary granuloma RIGHT middle lobe image 51. Musculoskeletal: Scattered degenerative disc disease changes thoracic spine Review of the MIP images confirms the above findings. CTA ABDOMEN AND PELVIS FINDINGS VASCULAR Aorta: Atherosclerotic calcification and tortuosity of abdominal aorta. No evidence of aneurysm or dissection. Celiac: Mild atherosclerotic plaque formation at origin of celiac  artery, estimated less than 50% narrowed SMA: Significant atherosclerotic plaque at origin of SMA, with greater  than 50% narrowing Renals: Atherosclerotic plaque at origins of BILATERAL renal arteries probably greater than 50% bilaterally IMA: IMA patent Inflow: Scattered calcified plaques throughout the iliac and visualized femoral systems without aneurysm Veins: Unopacified at CTA imaging.  Scattered pelvic phleboliths. Review of the MIP images confirms the above findings. NON-VASCULAR Hepatobiliary: Gallbladder surgically absent. Mild fatty infiltration of liver without mass. Pancreas: Normal appearance Spleen: Normal appearance Adrenals/Urinary Tract: Adrenal glands, kidneys, ureters, and bladder normal appearance Stomach/Bowel: Appendix not visualized but no pericecal inflammatory process seen. Sigmoid diverticulosis without evidence of diverticulitis. Stomach and bowel loops otherwise unremarkable. Lymphatic: No adenopathy Reproductive: Uterus surgically absent with normal sized ovaries Other: Small umbilical and supraumbilical ventral hernias containing fat. No free intraperitoneal air or fluid. Musculoskeletal: Bones demineralized with evidence of prior lumbar fusion L3-L5. Mild chronic anterolisthesis at L4-L5. Multilevel degenerative disc disease changes of the thoracolumbar spine. Fluid collection identified lateral to the RIGHT hip joint overlying the femoral neck and greater trochanter, maximal dimensions of 6.6 x 4.2 x 7.2 cm. A separate fluid collection is identified posterior inferior to the RIGHT ischium at the inferior RIGHT buttock, 8.8 x 5.2 cm in axial dimensions image 185, extending at least 4.4 cm in craniocaudal length beyond the inferior extent of exam. Overlying skin thickening and subcutaneous infiltration at the posterior collection. Questionable bone destruction at the RIGHT ischium raises question of decubitus ulcer with potential osteomyelitis. No definite hip joint effusion. Review of the MIP images confirms the above findings. IMPRESSION: Scattered atherosclerotic calcifications involving the  thoracic and abdominal aorta, coronary arteries, and abdominal visceral artery origins. No evidence of aortic aneurysm or dissection. 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 No acute intrathoracic abnormalities. Two RIGHT pelvic fluid collections are identified: 6.6 x 4.2 x 7.2 cm lateral to and abutting the RIGHT greater trochanter question trochanteric bursitis; and a second collection posteriorly overlying and extending caudal to the RIGHT ischial spine 8.8 x 5.2 x >4.4 cm in size, with overlying skin thickening, subcutaneous infiltration, and potential mild ischial destruction, highly suspicious for osteomyelitis and overlying abscess, potentially related to decubitus ulcer? Electronically Signed   By: Lavonia Dana M.D.   On: 04/17/2017 09:32    Pending Labs Unresulted Labs (From admission, onward)   Start     Ordered   04/18/17 0500  Comprehensive metabolic panel  Tomorrow morning,   R     04/17/17 1040   04/18/17 0500  CBC  Tomorrow morning,   R     04/17/17 1040   04/18/17 0500  Protime-INR  Tomorrow morning,   R     04/17/17 1040   04/18/17 0500  APTT  Tomorrow morning,   R     04/17/17 1040   04/18/17 0500  Magnesium  Daily,   R     04/17/17 1049   04/17/17 1034  CBC  (heparin)  Once,   R    Comments:  Baseline for heparin therapy IF NOT ALREADY DRAWN.  Notify MD if PLT < 100 K.    04/17/17 1040   04/17/17 1034  Creatinine, serum  (heparin)  Once,   R    Comments:  Baseline for heparin therapy IF NOT ALREADY DRAWN.    04/17/17 1040   04/17/17 0616  Urine culture  STAT,   STAT     04/17/17 0616   04/17/17 0615  Blood Culture (routine  x 2)  BLOOD CULTURE X 2,   STAT     04/17/17 0616      Vitals/Pain Today's Vitals   04/17/17 1107 04/17/17 1115 04/17/17 1130 04/17/17 1145  BP: 105/68 95/61 104/61 104/79  Pulse: 94 (!) 111 92 (!) 103  Resp: '18 17 20 17  ' Temp:      TempSrc:      SpO2: 99% 98% 98% 97%  Weight:      Height:      PainSc:        Isolation  Precautions No active isolations  Medications Medications  DULoxetine (CYMBALTA) DR capsule 60 mg (has no administration in time range)  DULoxetine (CYMBALTA) DR capsule 30 mg (has no administration in time range)  aspirin EC tablet 81 mg (has no administration in time range)  predniSONE (DELTASONE) tablet 5 mg (has no administration in time range)  pantoprazole (PROTONIX) EC tablet 40 mg (has no administration in time range)  heparin injection 5,000 Units (has no administration in time range)  0.9 %  sodium chloride infusion (has no administration in time range)  acetaminophen (TYLENOL) tablet 650 mg (has no administration in time range)    Or  acetaminophen (TYLENOL) suppository 650 mg (has no administration in time range)  HYDROcodone-acetaminophen (NORCO/VICODIN) 5-325 MG per tablet 1-2 tablet (has no administration in time range)  senna-docusate (Senokot-S) tablet 1 tablet (has no administration in time range)  bisacodyl (DULCOLAX) EC tablet 5 mg (has no administration in time range)  ondansetron (ZOFRAN) tablet 4 mg (has no administration in time range)    Or  ondansetron (ZOFRAN) injection 4 mg (has no administration in time range)  insulin aspart (novoLOG) injection 0-15 Units (has no administration in time range)  insulin aspart (novoLOG) injection 0-5 Units (has no administration in time range)  diltiazem (CARDIZEM) tablet 60 mg (has no administration in time range)  potassium chloride 10 mEq in 100 mL IVPB (has no administration in time range)  fentaNYL (SUBLIMAZE) injection 50 mcg (50 mcg Intravenous Given 04/17/17 0633)  sodium chloride 0.9 % bolus 1,000 mL (0 mLs Intravenous Stopped 04/17/17 0844)    And  sodium chloride 0.9 % bolus 1,000 mL (0 mLs Intravenous Stopped 04/17/17 0738)    And  sodium chloride 0.9 % bolus 1,000 mL (0 mLs Intravenous Stopped 04/17/17 0844)  levofloxacin (LEVAQUIN) IVPB 750 mg (0 mg Intravenous Stopped 04/17/17 0813)  aztreonam (AZACTAM) 2 GM IVPB  (0 g Intravenous Stopped 04/17/17 0647)  vancomycin (VANCOCIN) 2,000 mg in sodium chloride 0.9 % 500 mL IVPB (0 mg Intravenous Stopped 04/17/17 1137)  acetaminophen (TYLENOL) suppository 650 mg (650 mg Rectal Given 04/17/17 0647)  hydrocortisone sodium succinate (SOLU-CORTEF) 100 MG injection 100 mg (100 mg Intravenous Given 04/17/17 0807)  diltiazem (CARDIZEM) 100 mg in dextrose 5% 169m (1 mg/mL) infusion (5 mg/hr Intravenous Rate/Dose Change 04/17/17 1041)  sodium chloride 0.9 % injection (  Given 04/17/17 0845)  iopamidol (ISOVUE-370) 76 % injection 100 mL ( Intravenous Canceled Entry 04/17/17 0846)  fentaNYL (SUBLIMAZE) injection 50 mcg (50 mcg Intravenous Given 04/17/17 1020)    Mobility non-ambulatory

## 2017-04-17 NOTE — ED Notes (Signed)
Patient transported to X-ray 

## 2017-04-17 NOTE — Progress Notes (Signed)
MD paged again in regards to continuous pain. Patient crying out for help constantly. Alternate interventions implemented (cold/heat therapy), repositioning, and emotional support without relief. Awaiting further orders. Will continue to monitor.

## 2017-04-17 NOTE — Progress Notes (Signed)
Pharmacy Antibiotic Note  Andrea Yu is a 77 y.o. female admitted on 04/17/2017 with sepsis secondary to UTI. Dose of Vancomycin, Aztreonam, and Levofloxacin given in the ED. Pharmacy has been consulted to continue Vancomycin and Aztreonam at admission.   Plan: Vancomycin 2g IV x 1 given in ED. Continue with Vancomycin 750mg  IV q24h. Plan for Vancomycin levels at steady state, as indicated. Aztreonam 2g IV q8h (due to sepsis). Monitor renal function, cultures, clinical course.   Height: 5\' 3"  (160 cm) Weight: 185 lb (83.9 kg) IBW/kg (Calculated) : 52.4  Temp (24hrs), Avg:100.1 F (37.8 C), Min:98.3 F (36.8 C), Max:103 F (39.4 C)  Recent Labs  Lab 04/17/17 0633 04/17/17 0634 04/17/17 0853  WBC  --  22.9*  --   CREATININE  --  1.00  --   LATICACIDVEN 2.56*  --  1.33    Estimated Creatinine Clearance: 47.6 mL/min (by C-G formula based on SCr of 1 mg/dL).    Allergies  Allergen Reactions  . Penicillins Anaphylaxis    THROAT SWELLING  Has patient had a PCN reaction causing immediate rash, facial/tongue/throat swelling, SOB or lightheadedness with hypotension:  # # YES # #  Has patient had a PCN reaction causing severe rash involving mucus membranes or skin necrosis: # # # YES # # # Has patient had a PCN reaction that required hospitalization:No Has patient had a PCN reaction occurring within the last 10 years:  # # YES # #  If all of the above answers are "NO", then may proceed with Cephalosporin use.   04/19/17 [Denosumab] Other (See Comments)    Severe Pain in the groin area.  . Fosamax [Alendronate Sodium]     UNSPECIFIED REACTION     Antimicrobials this admission: 3/24 >> Levofloxacin x 1 3/24 >> Vancomycin >> 3/24 >> Aztreonam >>  Dose adjustments this admission: --  Microbiology results: 3/24 BCx: sent 3/24 UCx: sent  3/24 MRSA PCR: sent   Thank you for allowing pharmacy to be a part of this patient's care.   4/24, PharmD, BCPS Pager:  [phone] 04/17/2017 12:01 PM

## 2017-04-17 NOTE — H&P (Signed)
History and Physical    Andrea Yu:[id number] DOB: [birth year] DOA: 04/17/2017  PCP: Jarome Matin, MD Patient coming from: home   Chief Complaint: low back pain   HPI: Andrea Yu is a 77 y.o. female with medical history significant of atrial fibrillation, psoriatic arthritis, low back pain, peripheral neuropathy, osteoarthritis and multiple joint deformities, diabetes mellitus type 2 was brought to the hospital for evaluation of low back pain and altered mental status.  Family is not at bedside at the moment therefore history is limited.  Although patient's mentation is better she was confused earlier therefore does not remember the details of events. Patient has been suffering from low back pain and lower extremity neuropathy for quite some time and follows with rheumatology and outpatient orthopedic, Dr. Lajoyce Corners.  Over the last couple of days she has noticed her lower back pain has been somewhat worsening and has been moaning in pain and during this time family also noted that she was getting increasingly confused at home.  Denies any head trauma, abdominal pain, nausea, vomiting, dysuria or any other complaints.  Due to her acute distress from pain in change in mental status she was brought to the ER for evaluation.  In the ER initially patient was confused and was only able to tell us her name.  She was found to be in atrial fibrillation with RVR.  She had elevated WBC at 22.9.  UA was suggestive of urinary tract infection.  Lactate was 2.56.  She was started on vancomycin and aztreonam.  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  Orthopedic was consulted, Dr. Cleophas Dunker who recommended consulting interventional  radiology. She was also in atrial fibrillation with RVR in the ER.  Initially she was given 3 L of normal saline bolus and then started on Cardizem drip. Due to multiple medical issues she was admitted under medicine service.   Review of Systems: As per HPI otherwise 10 point review of systems negative.  Review of Systems Otherwise negative except as per HPI, including: General: Denies fever, chills, night sweats or unintended weight loss. Resp: Denies cough, wheezing, shortness of breath. Cardiac: Denies chest pain, palpitations, orthopnea, paroxysmal nocturnal dyspnea. GI: Denies abdominal pain, nausea, vomiting, diarrhea or constipation GU: Denies dysuria, frequency, hesitancy or incontinence MS: low back pain  Neuro: Denies headache, neurologic deficits (focal weakness, numbness, tingling), abnormal gait Psych: Denies anxiety, depression, SI/HI/AVH Skin: Denies new rashes or lesions ID: Denies sick contacts, exotic exposures, travel  Past Medical History:  Diagnosis Date  . A-fib (HCC)    "just after knee 2nd OR" (11/07/2012)  . Anemia   . Arthritis    RA , SACROTIC ARTHRITIS  . Complication of anesthesia    found to be in afib when she woke up  . Dysrhythmia   . GERD (gastroesophageal reflux disease)   . Head injury, closed, with concussion   . History of blood transfusion    "after 1 of my knee ORs and today" (11/07/2012)  . Hypertension   . Peripheral neuropathy    "from back OR in 2005; in hands and feet" (11/07/2012)  . Psoriatic arthritis (HCC)    on Prednisone Rx    Past Surgical History:  Procedure Laterality Date  .  ANKLE FUSION Right 01/07/2016   Procedure: Right Tibiocalcaneal Fusion;  Surgeon: Nadara Mustard, MD;  Location: [facility] OR;  Service: Orthopedics;  Laterality: Right;  . BACK SURGERY    . CATARACT EXTRACTION W/ INTRAOCULAR LENS  IMPLANT, BILATERAL Bilateral 2007  . CHOLECYSTECTOMY  2000's  . COLONOSCOPY N/A 11/10/2012   Procedure: COLONOSCOPY;   Surgeon: Theda Belfast, MD;  Location: [facility] ENDOSCOPY;  Service: Endoscopy;  Laterality: N/A;  . ESOPHAGOGASTRODUODENOSCOPY N/A 11/10/2012   Procedure: ESOPHAGOGASTRODUODENOSCOPY (EGD);  Surgeon: Theda Belfast, MD;  Location: [facility] ENDOSCOPY;  Service: Endoscopy;  Laterality: N/A;  . EYE SURGERY    . JOINT REPLACEMENT    . POSTERIOR LUMBAR FUSION  2005   "got a rod and 4 pins in there" (11/07/2012)  . REPLACEMENT TOTAL KNEE BILATERAL Bilateral 2007  . TRANSTHORACIC ECHOCARDIOGRAM  05/24/2006  . TUBAL LIGATION  1974  . VAGINAL HYSTERECTOMY  1983    SOCIAL HISTORY:  reports that she quit smoking about 35 years ago. Her smoking use included cigarettes. She has a 15.00 pack-year smoking history. She has never used smokeless tobacco. She reports that she does not drink alcohol or use drugs.  Allergies  Allergen Reactions  . Penicillins Anaphylaxis    THROAT SWELLING  Has patient had a PCN reaction causing immediate rash, facial/tongue/throat swelling, SOB or lightheadedness with hypotension:  # # YES # #  Has patient had a PCN reaction causing severe rash involving mucus membranes or skin necrosis: # # # YES # # # Has patient had a PCN reaction that required hospitalization:No Has patient had a PCN reaction occurring within the last 10 years:  # # YES # #  If all of the above answers are "NO", then may proceed with Cephalosporin use.   Jake Seats [Denosumab] Other (See Comments)    Severe Pain in the groin area.  Marland Kitchen Fosamax [Alendronate Sodium]     UNSPECIFIED REACTION     FAMILY HISTORY: Family History  Problem Relation Age of Onset  . Heart attack Father        1951-deceased   . Liver disease Mother   . Aneurysm Son 29       Deceased      Prior to Admission medications   Medication Sig Start Date End Date Taking? Authorizing Provider  aspirin EC 81 MG tablet Take 81 mg by mouth every evening.   Yes [provider]  BIOTIN 5000 PO Take 5,000 mg by mouth at bedtime.    Yes  [provider]  DULoxetine (CYMBALTA) 30 MG capsule Take 30 mg by mouth daily. Take along with 60 mg capsule=90 mg 03/24/17  Yes [provider]  DULoxetine (CYMBALTA) 60 MG capsule Take 60 mg by mouth daily. Take along with 30mg  capsule to make a total of 90mg    Yes [provider]  furosemide (LASIX) 80 MG tablet Take 80 mg by mouth.   Yes [provider]  gabapentin (NEURONTIN) 600 MG tablet Take 600-1,200 mg by mouth 2 (two) times daily. 1 tablet in morning and 2 tablets at night   Yes [provider]  HYDROcodone-acetaminophen (NORCO/VICODIN) 5-325 MG per tablet Take 1-2 tablets by mouth every 6 (six) hours as needed (for pain (scheduled 1 tablet at bedtime)).    Yes [provider]  Magnesium 250 MG TABS Take 1 tablet by mouth every evening.    Yes [provider]  Multiple Vitamin (MULTIVITAMIN WITH MINERALS) TABS Take 1 tablet by mouth daily.  Yes [provider]  omeprazole (PRILOSEC) 20 MG capsule Take 20 mg by mouth every evening. 11/08/15  Yes [provider]  potassium chloride SA (K-DUR,KLOR-CON) 20 MEQ tablet Take 20 mEq by mouth 2 (two) times daily.    Yes [provider]  predniSONE (DELTASONE) 5 MG tablet Take 5 mg by mouth daily.  08/12/12  Yes Tisovec, Adelfa Koh, MD  acetaminophen (TYLENOL) 325 MG tablet Take 2 tablets (650 mg total) by mouth every 4 (four) hours as needed. Patient taking differently: Take 325 mg by mouth every 6 (six) hours as needed (for pain.).  11/11/12   Christiane Ha, MD  Apremilast (OTEZLA) 30 MG TABS Take 30 mg by mouth 2 (two) times daily.    [provider]  DILT-XR 180 MG 24 hr capsule Take 180 mg by mouth at bedtime. 04/07/17   [provider]  FORTEO 600 MCG/2.4ML SOLN Inject 20 mcg into the skin daily.  03/26/17   [provider]  furosemide (LASIX) 40 MG tablet TAKE 2 TABLETS BY MOUTH  DAILY Patient not taking: Reported on  04/17/2017 10/20/16   Croitoru, Mihai, MD  losartan (COZAAR) 100 MG tablet Take 100 mg by mouth daily.  03/09/17   [provider]  metoprolol succinate (TOPROL-XL) 100 MG 24 hr tablet TAKE 1 TABLET BY MOUTH  DAILY 02/21/17   Croitoru, Mihai, MD  mupirocin ointment (BACTROBAN) 2 % Apply 1 application topically 2 (two) times daily. Apply to the affected area 2 times a day Patient not taking: Reported on 04/17/2017 01/29/16   Nadara Mustard, MD  PROAIR HFA 108 [phone] BASE) MCG/ACT inhaler Inhale 1-2 puffs into the lungs every 6 (six) hours as needed for wheezing or shortness of breath.  02/06/13   [provider]  silver sulfADIAZINE (SILVADENE) 1 % cream Apply 1 application topically daily. Patient not taking: Reported on 04/17/2017 11/11/16   Adonis Huguenin, NP  Vitamin D, Ergocalciferol, (DRISDOL) 50000 UNITS CAPS Take 50,000 Units by mouth every Tuesday.     [provider]    Physical Exam: Vitals:   04/17/17 1020 04/17/17 1030 04/17/17 1035 04/17/17 1040  BP: 103/62 96/64 105/67 100/63  Pulse: (!) 156 100 (!) 105 96  Resp: 16 (!) 7 11 14   Temp:      TempSrc:      SpO2: 97% 94% 98% 96%  Weight:      Height:          Constitutional: Chronically ill-appearing in distress due to low back pain, elderly Eyes: PERRL, lids and conjunctivae normal ENMT: Mucous membranes are dry. Posterior pharynx clear of any exudate or lesions.Normal dentition.  Neck: normal, supple, no masses, no thyromegaly Respiratory: Mildly diminished breath sounds at the bases Cardiovascular: Irregularly regular rhythm, no murmurs / rubs / gallops. No extremity edema. 2+ pedal pulses. No carotid bruits.  Abdomen: no tenderness, no masses palpated. No hepatosplenomegaly. Bowel sounds positive.  Musculoskeletal: no clubbing / cyanosis. No joint deformity upper and lower extremities. Good ROM, no contractures. Normal muscle tone.  Skin: Stage II pressure ulcer noted in the right heel, 3x2 inch soft  tissue noted on the right ischeial area possible lipoma.  Neurologic: CN 2-12 grossly intact. Sensation intact, DTR normal. Strength 5/5 in all 4.  Psychiatric: Normal judgment and insight. Alert and oriented x 2.  Agitated due to pain    Labs on Admission: I have personally reviewed following labs and imaging studies  CBC: Recent Labs  Lab  04/17/17 0634  WBC 22.9*  NEUTROABS 21.4*  HGB 10.5*  HCT 31.4*  MCV 86.5  PLT 314   Basic Metabolic Panel: Recent Labs  Lab 04/17/17 0634  NA 137  K 3.2*  CL 101  CO2 22  GLUCOSE 129*  BUN 12  CREATININE 1.00  CALCIUM 8.9   GFR: Estimated Creatinine Clearance: 47.6 mL/min (by C-G formula based on SCr of 1 mg/dL). Liver Function Tests: Recent Labs  Lab 04/17/17 0634  AST 21  ALT 10*  ALKPHOS 119  BILITOT 0.9  PROT 6.5  ALBUMIN 2.8*   No results for input(s): LIPASE, AMYLASE in the last 168 hours. No results for input(s): AMMONIA in the last 168 hours. Coagulation Profile: No results for input(s): INR, PROTIME in the last 168 hours. Cardiac Enzymes: No results for input(s): CKTOTAL, CKMB, CKMBINDEX, TROPONINI in the last 168 hours. BNP (last 3 results) No results for input(s): PROBNP in the last 8760 hours. HbA1C: No results for input(s): HGBA1C in the last 72 hours. CBG: No results for input(s): GLUCAP in the last 168 hours. Lipid Profile: No results for input(s): CHOL, HDL, LDLCALC, TRIG, CHOLHDL, LDLDIRECT in the last 72 hours. Thyroid Function Tests: No results for input(s): TSH, T4TOTAL, FREET4, T3FREE, THYROIDAB in the last 72 hours. Anemia Panel: No results for input(s): VITAMINB12, FOLATE, FERRITIN, TIBC, IRON, RETICCTPCT in the last 72 hours. Urine analysis:    Component Value Date/Time   COLORURINE YELLOW 04/17/2017 0649   APPEARANCEUR CLOUDY (A) 04/17/2017 0649   LABSPEC 1.015 04/17/2017 0649   PHURINE 5.0 04/17/2017 0649   GLUCOSEU NEGATIVE 04/17/2017 0649   HGBUR MODERATE (A) 04/17/2017 0649    BILIRUBINUR NEGATIVE 04/17/2017 0649   KETONESUR NEGATIVE 04/17/2017 0649   PROTEINUR 30 (A) 04/17/2017 0649   UROBILINOGEN 0.2 09/14/2014 1637   NITRITE NEGATIVE 04/17/2017 0649   LEUKOCYTESUR LARGE (A) 04/17/2017 0649   Sepsis Labs: !!!!!!!!!!!!!!!!!!!!!!!!!!!!!!!!!!!!!!!!!!!! @LABRCNTIP (procalcitonin:4,lacticidven:4) )No results found for this or any previous visit (from the past 240 hour(s)).   Radiological Exams on Admission: Dg Chest 2 View  Result Date: 04/17/2017 CLINICAL DATA:  Fever and severe back pain. EXAM: CHEST - 2 VIEW COMPARISON:  11/06/2012. FINDINGS: Poor inspiration. Normal sized heart. Clear lungs. Mild scoliosis. Lower thoracic spine degenerative changes. Superior bright creation of both humeral heads with bony remodeling and bilateral glenohumeral degenerative changes. IMPRESSION: 1. No acute abnormality. 2. Large bilateral chronic rotator cuff tears and bilateral shoulder degenerative changes. Electronically Signed   By: Beckie Salts M.D.   On: 04/17/2017 08:40   Dg Thoracic Spine 2 View  Result Date: 04/17/2017 CLINICAL DATA:  Pain after falling EXAM: THORACIC SPINE 2 VIEWS COMPARISON:  None. FINDINGS: Multilevel thoracic vertebral body height loss. No acute fracture. Normal alignment. IMPRESSION: No acute fracture or static subluxation of the thoracic spine. Electronically Signed   By: Deatra Robinson M.D.   On: 04/17/2017 06:30   Dg Lumbar Spine Complete  Result Date: 04/17/2017 CLINICAL DATA:  Fall with back pain EXAM: LUMBAR SPINE - COMPLETE 4+ VIEW COMPARISON:  Lumbar spine MRI 09/01/2015 FINDINGS: Posterior fusion hardware at L3-L5 is unchanged alignment. No abnormal lucency. No acute fracture. Multilevel upper lumbar vertebral body height loss is unchanged. IMPRESSION: No acute fracture of the lumbar spine. Electronically Signed   By: Deatra Robinson M.D.   On: 04/17/2017 06:32   Ct Angio Chest/abd/pel For Dissection W And/or Wo Contrast  Result Date:  04/17/2017 CLINICAL DATA:  Larey Seat a couple days ago at home, tonight developed back  spasms and pain, generalized acute abdominal pain, history GERD, hypertension paroxysmal atrial fibrillation EXAM: CT ANGIOGRAPHY CHEST, ABDOMEN AND PELVIS TECHNIQUE: Multidetector CT imaging through the chest, abdomen and pelvis was performed using the standard protocol during bolus administration of intravenous contrast. Multiplanar reconstructed images and MIPs were obtained and reviewed to evaluate the vascular anatomy. CONTRAST:  100 ML ISOVUE-370 IOPAMIDOL (ISOVUE-370) INJECTION 76% IV. No oral contrast. COMPARISON:  CT abdomen and pelvis 03/29/2013 FINDINGS: CTA CHEST FINDINGS Cardiovascular: Scattered atherosclerotic calcifications aorta, proximal great vessels and coronary arteries. No evidence of intramural hematoma on precontrast imaging. Normal aortic enhancement following contrast without aneurysm or dissection. No pericardial effusion. Pulmonary arteries grossly patent. Mediastinum/Nodes: Esophagus unremarkable. Base of cervical region normal appearance. No thoracic adenopathy. Lungs/Pleura: Minimal dependent atelectasis in the posterior lungs. Lungs otherwise clear. No acute infiltrate, pleural effusion, or pneumothorax. Tiny calcified pulmonary granuloma RIGHT middle lobe image 51. Musculoskeletal: Scattered degenerative disc disease changes thoracic spine Review of the MIP images confirms the above findings. CTA ABDOMEN AND PELVIS FINDINGS VASCULAR Aorta: Atherosclerotic calcification and tortuosity of abdominal aorta. No evidence of aneurysm or dissection. Celiac: Mild atherosclerotic plaque formation at origin of celiac artery, estimated less than 50% narrowed SMA: Significant atherosclerotic plaque at origin of SMA, with greater than 50% narrowing Renals: Atherosclerotic plaque at origins of BILATERAL renal arteries probably greater than 50% bilaterally IMA: IMA patent Inflow: Scattered calcified plaques throughout  the iliac and visualized femoral systems without aneurysm Veins: Unopacified at CTA imaging.  Scattered pelvic phleboliths. Review of the MIP images confirms the above findings. NON-VASCULAR Hepatobiliary: Gallbladder surgically absent. Mild fatty infiltration of liver without mass. Pancreas: Normal appearance Spleen: Normal appearance Adrenals/Urinary Tract: Adrenal glands, kidneys, ureters, and bladder normal appearance Stomach/Bowel: Appendix not visualized but no pericecal inflammatory process seen. Sigmoid diverticulosis without evidence of diverticulitis. Stomach and bowel loops otherwise unremarkable. Lymphatic: No adenopathy Reproductive: Uterus surgically absent with normal sized ovaries Other: Small umbilical and supraumbilical ventral hernias containing fat. No free intraperitoneal air or fluid. Musculoskeletal: Bones demineralized with evidence of prior lumbar fusion L3-L5. Mild chronic anterolisthesis at L4-L5. Multilevel degenerative disc disease changes of the thoracolumbar spine. Fluid collection identified lateral to the RIGHT hip joint overlying the femoral neck and greater trochanter, maximal dimensions of 6.6 x 4.2 x 7.2 cm. A separate fluid collection is identified posterior inferior to the RIGHT ischium at the inferior RIGHT buttock, 8.8 x 5.2 cm in axial dimensions image 185, extending at least 4.4 cm in craniocaudal length beyond the inferior extent of exam. Overlying skin thickening and subcutaneous infiltration at the posterior collection. Questionable bone destruction at the RIGHT ischium raises question of decubitus ulcer with potential osteomyelitis. No definite hip joint effusion. Review of the MIP images confirms the above findings. IMPRESSION: Scattered atherosclerotic calcifications involving the thoracic and abdominal aorta, coronary arteries, and abdominal visceral artery origins. No evidence of aortic aneurysm or dissection. 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  No acute intrathoracic abnormalities. Two RIGHT pelvic fluid collections are identified: 6.6 x 4.2 x 7.2 cm lateral to and abutting the RIGHT greater trochanter question trochanteric bursitis; and a second collection posteriorly overlying and extending caudal to the RIGHT ischial spine 8.8 x 5.2 x >4.4 cm in size, with overlying skin thickening, subcutaneous infiltration, and potential mild ischial destruction, highly suspicious for osteomyelitis and overlying abscess, potentially related to decubitus ulcer? Electronically Signed   By: Ulyses Southward M.D.   On: 04/17/2017 09:32     All images  have been reviewed by me personally.  EKG: Independently reviewed.  Atrial fibrillation with RVR, rate is 175  Assessment/Plan Principal Problem:   Sepsis secondary to UTI [facility]) Active Problems:   PAF (paroxysmal atrial fibrillation) (HCC)   Psoriatic arthritis (HCC)   Peripheral neuropathy   Atrial fibrillation with RVR (HCC)   Hypertension   GERD (gastroesophageal reflux disease)   Altered mental status   Hip osteomyelitis, right (HCC)    Acute metabolic encephalopathy, improved Sepsis secondary to urinary tract infection -Admit the patient to the stepdown unit for closer monitoring -Sepsis protocol initiated in the ER, 3 L of normal saline bolus given.  Blood pressure has improved along with her heart rate.  For now we will continue 75 cc of normal saline maintenance fluid - Pancultures has been done.  Continue IV vancomycin and aztreonam, patient is allergic to penicillin -Closely monitor her vital signs, no CT of the head done but patient is not exhibiting any focal neurologic signs therefore hold off on this. -Provide supportive care  Acute on chronic lower back pain Right-sided pelvic fluid collection with trochanteric bursitis Right ischial spine suspicion for osteomyelitis and overlying abscess -Case discussed with orthopedic, Dr. Cleophas Dunker who recommended consulting interventional  radiology for drainage.  Consulted Dr Lowella Dandy from IR who will review the scans and determine if patient is a candidate for draining the abscess -For now continue IV antibiotics, pain control -Eventually will need PT/OT  Atrial fibrillation with RVR History of paroxysmal atrial fibrillation, not on anticoagulation. - At home it appears she is on metoprolol and Cardizem for rate control.  Not on anticoagulation due to previously determined risk of falls.  Only on aspirin 81 mg -Currently she is on Cardizem drip.  Rate is controlled around 110, SBP ~110.  Will hold off on her long-acting Cardizem and Toprol at home due to soft blood pressure and getting IV pain medication.  I will give her Cardizem 60 mg short-acting orally for now and attempts to wean off the drip. -Replete potassium, getting gentle IV fluids.  We will recheck labs tomorrow including magnesium -If necessary consult cardiology  History of adrenal insufficiency History of psoriatic arthritis -Low-dose steroids.  Follow-up with outpatient orthopedic and rheumatology  Diabetes mellitus type 2 - N.p.o. for now in case if she gets IR procedure today.  Holding off on long-acting insulin -We will do insulin sliding scale and Accu-Cheks  History of depression -Continue Cymbalta   DVT prophylaxis: Subcutaneous heparin Code Status: Full code Family Communication: None at bedside Disposition Plan: To be determined Consults called: ED physician spoke with Dr. Cleophas Dunker from orthopedic who recommended consulting interventional radiology.  I have consulted interventional radiology- Dr Lowella Dandy will review the CAT scan Admission status: Stepdown unit admission   Please Note: This patient record was dictated using Dragon software. Chart creation errors have been sought, but may not always have been located. Such creation errors do not reflect on the Standard of Medical Care.   Jadaya Sommerfield Joline Maxcy MD [facility] Pager [phone]  If 7PM-7AM, please contact night-coverage [email or web address] Password [facility]  04/17/2017, 10:47 AM

## 2017-04-17 NOTE — Procedures (Signed)
  Pre-operative Diagnosis: Concern for infection and abscesses in right trochanteric bursa and along right ischial tuberosity       Post-operative Diagnosis: Hematoma in right trochanteric bursa and right ischial/gluteal region   Indications: Pelvic fluid collections, concern for infection   Procedure: CT guided placement of 10 Fr drain in right ischial hematoma; CT aspiration of right trochanteric hematoma  Findings: Bloody fluid in both fluid collections.  The drainable fluid was removed from the ischial collection (approximately 100 ml).  The drain was removed at end of procedure to decrease risk of hematoma contamination/infection.  70 ml of blood aspirated from right trochanteric collection.   Complications: None     EBL: Minimal  Plan: Send samples of both collections for culture.

## 2017-04-17 NOTE — Progress Notes (Signed)
A consult was received from an ED physician for azactam, levaquin and vancomycin per pharmacy dosing.  The patient's profile has been reviewed for ht/wt/allergies/indication/available labs.   A one time order has been placed for Azactam 2 Gm, Levaquin 750 mg and Vancomycin 2 Gm.  Further antibiotics/pharmacy consults should be ordered by admitting physician if indicated.                       Thank you, Lorenza Evangelist 04/17/2017  6:21 AM

## 2017-04-17 NOTE — ED Notes (Signed)
Patient transported to CT 

## 2017-04-17 NOTE — Progress Notes (Signed)
PHARMACY - PHYSICIAN COMMUNICATION CRITICAL VALUE ALERT - BLOOD CULTURE IDENTIFICATION (BCID)  Andrea Yu is an 77 y.o. female who presented to [facility] on 04/17/2017 with a chief complaint of sepsis secondary to UTI and pelvic abscess  Assessment:  Sepsis, urinary and pelvic abscess (include suspected source if known)  Name of physician (or Provider) Contacted: Bodenheimer Current antibiotics: Azactam 2 Gm IV q8h and Vancomycin 750 mg IV q24h  Changes to prescribed antibiotics recommended:  Per algorithm Vancomycin appropriate in severe PCN allergy  Patient is on recommended antibiotics - No changes needed  Results for orders placed or performed during the hospital encounter of 04/17/17  Blood Culture ID Panel (Reflexed) (Collected: 04/17/2017  6:34 AM)  Result Value Ref Range   Enterococcus species NOT DETECTED NOT DETECTED   Listeria monocytogenes NOT DETECTED NOT DETECTED   Staphylococcus species NOT DETECTED NOT DETECTED   Staphylococcus aureus NOT DETECTED NOT DETECTED   Streptococcus species DETECTED (A) NOT DETECTED   Streptococcus agalactiae DETECTED (A) NOT DETECTED   Streptococcus pneumoniae NOT DETECTED NOT DETECTED   Streptococcus pyogenes NOT DETECTED NOT DETECTED   Acinetobacter baumannii NOT DETECTED NOT DETECTED   Enterobacteriaceae species NOT DETECTED NOT DETECTED   Enterobacter cloacae complex NOT DETECTED NOT DETECTED   Escherichia coli NOT DETECTED NOT DETECTED   Klebsiella oxytoca NOT DETECTED NOT DETECTED   Klebsiella pneumoniae NOT DETECTED NOT DETECTED   Proteus species NOT DETECTED NOT DETECTED   Serratia marcescens NOT DETECTED NOT DETECTED   Haemophilus influenzae NOT DETECTED NOT DETECTED   Neisseria meningitidis NOT DETECTED NOT DETECTED   Pseudomonas aeruginosa NOT DETECTED NOT DETECTED   Candida albicans NOT DETECTED NOT DETECTED   Candida glabrata NOT DETECTED NOT DETECTED   Candida krusei NOT DETECTED NOT DETECTED   Candida parapsilosis  NOT DETECTED NOT DETECTED   Candida tropicalis NOT DETECTED NOT DETECTED    Lorenza Evangelist 04/17/2017  10:41 PM

## 2017-04-17 NOTE — Progress Notes (Signed)
MD paged in regards to patient's HR sustaining in 120s-130s. Previous diltiazem drip order has been discontinued. Awaiting further orders.  Will continue to monitor.

## 2017-04-18 DIAGNOSIS — R509 Fever, unspecified: Secondary | ICD-10-CM

## 2017-04-18 DIAGNOSIS — I10 Essential (primary) hypertension: Secondary | ICD-10-CM

## 2017-04-18 DIAGNOSIS — I480 Paroxysmal atrial fibrillation: Secondary | ICD-10-CM

## 2017-04-18 LAB — APTT: aPTT: 53 seconds — ABNORMAL HIGH (ref 24–36)

## 2017-04-18 LAB — MAGNESIUM: MAGNESIUM: 1.5 mg/dL — AB (ref 1.7–2.4)

## 2017-04-18 LAB — COMPREHENSIVE METABOLIC PANEL
ALBUMIN: 2.2 g/dL — AB (ref 3.5–5.0)
ALT: 11 U/L — ABNORMAL LOW (ref 14–54)
ANION GAP: 9 (ref 5–15)
AST: 16 U/L (ref 15–41)
Alkaline Phosphatase: 91 U/L (ref 38–126)
BUN: 15 mg/dL (ref 6–20)
CHLORIDE: 110 mmol/L (ref 101–111)
CO2: 21 mmol/L — AB (ref 22–32)
Calcium: 8.6 mg/dL — ABNORMAL LOW (ref 8.9–10.3)
Creatinine, Ser: 0.78 mg/dL (ref 0.44–1.00)
GFR calc non Af Amer: >60 mL/min (ref >60)
GLUCOSE: 81 mg/dL (ref 65–99)
POTASSIUM: 3.7 mmol/L (ref 3.5–5.1)
SODIUM: 140 mmol/L (ref 135–145)
Total Bilirubin: 0.6 mg/dL (ref 0.3–1.2)
Total Protein: 5.3 g/dL — ABNORMAL LOW (ref 6.5–8.1)

## 2017-04-18 LAB — GLUCOSE, CAPILLARY
GLUCOSE-CAPILLARY: 71 mg/dL (ref 65–99)
GLUCOSE-CAPILLARY: 81 mg/dL (ref 65–99)
GLUCOSE-CAPILLARY: 92 mg/dL (ref 65–99)
GLUCOSE-CAPILLARY: 65 mg/dL (ref 65–99)
Glucose-Capillary: 115 mg/dL — ABNORMAL HIGH (ref 65–99)

## 2017-04-18 LAB — CBC
HEMATOCRIT: 27 % — AB (ref 36.0–46.0)
HEMOGLOBIN: 8.7 g/dL — AB (ref 12.0–15.0)
MCH: 28.7 pg (ref 26.0–34.0)
MCHC: 32.2 g/dL (ref 30.0–36.0)
MCV: 89.1 fL (ref 78.0–100.0)
Platelets: 288 10*3/uL (ref 150–400)
RBC: 3.03 MIL/uL — AB (ref 3.87–5.11)
RDW: 15.2 % (ref 11.5–15.5)
WBC: 15.7 10*3/uL — ABNORMAL HIGH (ref 4.0–10.5)

## 2017-04-18 LAB — PROTIME-INR
INR: 1.21
Prothrombin Time: 15.2 seconds (ref 11.4–15.2)

## 2017-04-18 MED ORDER — DEXTROSE 50 % IV SOLN
INTRAVENOUS | Status: AC
  Administered 2017-04-18: 25 mL
  Filled 2017-04-18: qty 50

## 2017-04-18 MED ORDER — METHOCARBAMOL 500 MG PO TABS
500.0000 mg | ORAL_TABLET | Freq: Three times a day (TID) | ORAL | Status: DC | PRN
  Administered 2017-04-18 – 2017-04-22 (×10): 500 mg via ORAL
  Filled 2017-04-18 (×11): qty 1

## 2017-04-18 MED ORDER — SODIUM CHLORIDE 0.9 % IV BOLUS (SEPSIS)
500.0000 mL | Freq: Once | INTRAVENOUS | Status: AC
  Administered 2017-04-18: 500 mL via INTRAVENOUS

## 2017-04-18 MED ORDER — DILTIAZEM HCL ER COATED BEADS 180 MG PO CP24
180.0000 mg | ORAL_CAPSULE | Freq: Every day | ORAL | Status: DC
  Administered 2017-04-18 – 2017-04-19 (×2): 180 mg via ORAL
  Filled 2017-04-18 (×4): qty 1

## 2017-04-18 MED ORDER — METOPROLOL SUCCINATE ER 25 MG PO TB24
100.0000 mg | ORAL_TABLET | Freq: Every day | ORAL | Status: DC
  Administered 2017-04-18 – 2017-04-20 (×3): 100 mg via ORAL
  Filled 2017-04-18 (×3): qty 4

## 2017-04-18 MED ORDER — DILTIAZEM HCL ER 180 MG PO CP24: 180.0000 mg | ORAL_CAPSULE | Freq: Every day | ORAL | Status: DC

## 2017-04-18 MED ORDER — MAGNESIUM OXIDE 400 (241.3 MG) MG PO TABS
400.0000 mg | ORAL_TABLET | Freq: Two times a day (BID) | ORAL | Status: AC
  Administered 2017-04-18 (×2): 400 mg via ORAL
  Filled 2017-04-18 (×2): qty 1

## 2017-04-18 MED ORDER — SODIUM CHLORIDE 0.9 % IV BOLUS
500.0000 mL | Freq: Once | INTRAVENOUS | Status: AC
  Administered 2017-04-18: 500 mL via INTRAVENOUS

## 2017-04-18 MED ORDER — FENTANYL CITRATE (PF) 100 MCG/2ML IJ SOLN
50.0000 ug | Freq: Once | INTRAMUSCULAR | Status: AC
  Administered 2017-04-18: 50 ug via INTRAVENOUS
  Filled 2017-04-18: qty 2

## 2017-04-18 MED ORDER — PREDNISONE 5 MG PO TABS: 5.0000 mg | ORAL_TABLET | Freq: Every day | ORAL | Status: DC

## 2017-04-18 MED ORDER — SODIUM CHLORIDE 0.9 % IV SOLN
INTRAVENOUS | Status: DC
  Administered 2017-04-18 (×2): via INTRAVENOUS

## 2017-04-18 MED ORDER — METOPROLOL TARTRATE 5 MG/5ML IV SOLN
5.0000 mg | Freq: Four times a day (QID) | INTRAVENOUS | Status: DC | PRN
  Administered 2017-04-18: 5 mg via INTRAVENOUS
  Filled 2017-04-18: qty 5

## 2017-04-18 NOTE — Progress Notes (Addendum)
[facility] PROGRESS NOTE    Progress Note  Andrea Yu  ZOX:[id number] DOB: 12-17-1940 DOA: 04/17/2017 PCP: Jarome Matin, MD     Brief Narrative:   Andrea Yu is an 77 y.o. female past medical history significant for atrial fibrillation, low back pain peripheral neuropathy is brought into the hospital for evaluation of low back pain and altered mental status, found to be in sepsis with encephalopathy due to urinary tract infection.  Assessment/Plan:   Sepsis secondary to UTI [facility]): She was fluid resuscitated with 2 L of normal saline heart rate improved. Blood pressure has improved, she continued to spike a fever this morning. Started empirically on admission on IV vancomycin and aztreonam due to her penicillin allergies her fever curve has improved. leukocytosis is improved. Blood cultures ID reflex panel grew positive for strep agalactiae CT of the head of the head done showed no acute findings.  Acute on chronic low back pain right side pelvic fluid collection with intertrochanteric bursitis and right ischial spine suspicious for osteomyelitis with overlying abscess. Case discussed with orthopedic physician Dr. Rogelio Seen who recommended IR consult for intervention. Dr. Lowella Dandy reviewed the scan, and IR performed a percutaneous aspiration of this shows 100 cc of bloody fluid.   Start her on oral Robaxin for pain.  A. fib with RVR: This morning she is in A. fib with RVR on diltiazem drip will resume her oral metoprolol and Cardizem not a candidate for anticoagulation, due to multiple falls. We will add metoprolol IV as needed.  Hypokalemia: Repleted orally magnesium is 1.5 we will repleat PAF (paroxysmal atrial fibrillation) (HCC)  History of adrenal insufficiency: Continue low-dose steroids.  Diabetes mellitus type II: Status post IR drainage. Resume her oral diet continue sliding scale insulin. Continue sliding scale insulin.  Essential   Hypertension Pressure is improved resume diltiazem and metoprolol.   GERD (gastroesophageal reflux disease)  Pressure injury of skin Stage I in the sacral area, stage III in the heel  DVT prophylaxis: lovenox Family Communication:daughter Disposition Plan/Barrier to D/C: home once fever and infection controlled Code Status:     Code Status Orders  (From admission, onward)        Start     Ordered   04/17/17 1035  Full code  Continuous     04/17/17 1040    Code Status History    Date Active Date Inactive Code Status Order ID Comments User Context   01/07/2016 1224 01/09/2016 1519 Full Code [id number]  Nadara Mustard, MD Inpatient   09/14/2014 0404 09/16/2014 1813 Full Code [id number]  Ron Parker, MD Inpatient   11/06/2012 1730 11/11/2012 1419 DNR [id number]  Edsel Petrin, DO Inpatient   11/06/2012 1453 11/06/2012 1730 Full Code [id number]  Jeanella Craze, NP ED        IV Access:    Peripheral IV   Procedures and diagnostic studies:   Dg Chest 2 View  Result Date: 04/17/2017 CLINICAL DATA:  Fever and severe back pain. EXAM: CHEST - 2 VIEW COMPARISON:  11/06/2012. FINDINGS: Poor inspiration. Normal sized heart. Clear lungs. Mild scoliosis. Lower thoracic spine degenerative changes. Superior bright creation of both humeral heads with bony remodeling and bilateral glenohumeral degenerative changes. IMPRESSION: 1. No acute abnormality. 2. Large bilateral chronic rotator cuff tears and bilateral shoulder degenerative changes. Electronically Signed   By: Beckie Salts M.D.   On: 04/17/2017 08:40   Dg Thoracic Spine 2 View  Result Date: 04/17/2017 CLINICAL DATA:  Pain after falling  EXAM: THORACIC SPINE 2 VIEWS COMPARISON:  None. FINDINGS: Multilevel thoracic vertebral body height loss. No acute fracture. Normal alignment. IMPRESSION: No acute fracture or static subluxation of the thoracic spine. Electronically Signed   By: Deatra Robinson M.D.   On: 04/17/2017 06:30   Dg  Lumbar Spine Complete  Result Date: 04/17/2017 CLINICAL DATA:  Fall with back pain EXAM: LUMBAR SPINE - COMPLETE 4+ VIEW COMPARISON:  Lumbar spine MRI 09/01/2015 FINDINGS: Posterior fusion hardware at L3-L5 is unchanged alignment. No abnormal lucency. No acute fracture. Multilevel upper lumbar vertebral body height loss is unchanged. IMPRESSION: No acute fracture of the lumbar spine. Electronically Signed   By: Deatra Robinson M.D.   On: 04/17/2017 06:32   Ct Aspiration  Result Date: 04/17/2017 INDICATION: 77 year old with large fluid collections in the right pelvis. One collection in the right trochanteric region and a second collection in the right inferior gluteal region adjacent to the ischial tuberosity. Concern for underlying infection. EXAM: CT-GUIDED DRAIN PLACEMENT WITHIN THE RIGHT ISCHIAL FLUID COLLECTION CT-GUIDED ASPIRATION OF THE RIGHT FEMORAL TROCHANTERIC FLUID COLLECTION MEDICATIONS: The patient is currently admitted to the hospital and receiving intravenous antibiotics. The antibiotics were administered within an appropriate time frame prior to the initiation of the procedure. ANESTHESIA/SEDATION: 1.5 mg IV Versed 75 mcg IV Fentanyl Moderate Sedation Time:  42 minutes The patient was continuously monitored during the procedure by the interventional radiology nurse under my direct supervision. COMPLICATIONS: None immediate. TECHNIQUE: Informed written consent was obtained from the patient after a thorough discussion of the procedural risks, benefits and alternatives. All questions were addressed. Maximal Sterile Barrier Technique was utilized including caps, mask, sterile gowns, sterile gloves, sterile drape, hand hygiene and skin antiseptic. A timeout was performed prior to the initiation of the procedure. PROCEDURE: Patient was placed prone. Images through the lower pelvis were obtained. Skin was marked over the right ischial and right trochanteric fluid collections. Right ischial region was  prepped and draped in a sterile fashion. Skin was anesthetized with 1% lidocaine. 18 gauge trocar needle was directed into this collection with CT guidance and bloody fluid was aspirated. A stiff Amplatz wire was advanced into the collection. The tract was dilated to accommodate a 10 [place] drain. 100 mL of bloody fluid was removed. No additional fluid could be aspirated. Follow up CT images were obtained at this point. Fluid sample collected for culture. The drain was left in place at this time. Attention was directed to the right trochanteric fluid collection. A new sterile field was created. Skin was anesthetized with 1% lidocaine. 19 gauge Yueh catheter was directed into the right trochanteric fluid collection. 70 mL of bloody fluid was removed. Follow up CT images were obtained and confirmed decompression of the right trochanteric fluid collection. This Yueh catheter was removed. Fluid sample sent for culture. Attention was directed back to the 10 [place] drain. No additional fluid could be removed. Therefore, the drain was cut and completely removed. Bandages placed at both puncture sites. FINDINGS: Heterogeneous collection in the right inferior gluteal region and adjacent to the right ischial tuberosity. The collection looks like a hematoma on these non contrast images. The low-density component of the collection was successfully drained following placement of the 10 French drain. Findings are most compatible with a hematoma. The drain was removed in order to decrease the risk of hematoma contamination or infection. Bloody fluid was removed from the right femoral trochanteric fluid collection. Majority of the right trochanteric fluid was removed. IMPRESSION: CT-guided drainage of  the right trochanteric and right gluteal fluid collections. Findings are most compatible with hematomas. Samples from both collections were sent for culture. 10 French drain was placed within the right ischial/gluteal collection.  However, this drain was removed at the end of the procedure in order to prevent hematoma contamination or infection. Electronically Signed   By: Richarda Overlie M.D.   On: 04/17/2017 16:08   Ct Angio Chest/abd/pel For Dissection W And/or Wo Contrast  Result Date: 04/17/2017 CLINICAL DATA:  Larey Seat a couple days ago at home, tonight developed back spasms and pain, generalized acute abdominal pain, history GERD, hypertension paroxysmal atrial fibrillation EXAM: CT ANGIOGRAPHY CHEST, ABDOMEN AND PELVIS TECHNIQUE: Multidetector CT imaging through the chest, abdomen and pelvis was performed using the standard protocol during bolus administration of intravenous contrast. Multiplanar reconstructed images and MIPs were obtained and reviewed to evaluate the vascular anatomy. CONTRAST:  100 ML ISOVUE-370 IOPAMIDOL (ISOVUE-370) INJECTION 76% IV. No oral contrast. COMPARISON:  CT abdomen and pelvis 03/29/2013 FINDINGS: CTA CHEST FINDINGS Cardiovascular: Scattered atherosclerotic calcifications aorta, proximal great vessels and coronary arteries. No evidence of intramural hematoma on precontrast imaging. Normal aortic enhancement following contrast without aneurysm or dissection. No pericardial effusion. Pulmonary arteries grossly patent. Mediastinum/Nodes: Esophagus unremarkable. Base of cervical region normal appearance. No thoracic adenopathy. Lungs/Pleura: Minimal dependent atelectasis in the posterior lungs. Lungs otherwise clear. No acute infiltrate, pleural effusion, or pneumothorax. Tiny calcified pulmonary granuloma RIGHT middle lobe image 51. Musculoskeletal: Scattered degenerative disc disease changes thoracic spine Review of the MIP images confirms the above findings. CTA ABDOMEN AND PELVIS FINDINGS VASCULAR Aorta: Atherosclerotic calcification and tortuosity of abdominal aorta. No evidence of aneurysm or dissection. Celiac: Mild atherosclerotic plaque formation at origin of celiac artery, estimated less than 50%  narrowed SMA: Significant atherosclerotic plaque at origin of SMA, with greater than 50% narrowing Renals: Atherosclerotic plaque at origins of BILATERAL renal arteries probably greater than 50% bilaterally IMA: IMA patent Inflow: Scattered calcified plaques throughout the iliac and visualized femoral systems without aneurysm Veins: Unopacified at CTA imaging.  Scattered pelvic phleboliths. Review of the MIP images confirms the above findings. NON-VASCULAR Hepatobiliary: Gallbladder surgically absent. Mild fatty infiltration of liver without mass. Pancreas: Normal appearance Spleen: Normal appearance Adrenals/Urinary Tract: Adrenal glands, kidneys, ureters, and bladder normal appearance Stomach/Bowel: Appendix not visualized but no pericecal inflammatory process seen. Sigmoid diverticulosis without evidence of diverticulitis. Stomach and bowel loops otherwise unremarkable. Lymphatic: No adenopathy Reproductive: Uterus surgically absent with normal sized ovaries Other: Small umbilical and supraumbilical ventral hernias containing fat. No free intraperitoneal air or fluid. Musculoskeletal: Bones demineralized with evidence of prior lumbar fusion L3-L5. Mild chronic anterolisthesis at L4-L5. Multilevel degenerative disc disease changes of the thoracolumbar spine. Fluid collection identified lateral to the RIGHT hip joint overlying the femoral neck and greater trochanter, maximal dimensions of 6.6 x 4.2 x 7.2 cm. A separate fluid collection is identified posterior inferior to the RIGHT ischium at the inferior RIGHT buttock, 8.8 x 5.2 cm in axial dimensions image 185, extending at least 4.4 cm in craniocaudal length beyond the inferior extent of exam. Overlying skin thickening and subcutaneous infiltration at the posterior collection. Questionable bone destruction at the RIGHT ischium raises question of decubitus ulcer with potential osteomyelitis. No definite hip joint effusion. Review of the MIP images confirms the  above findings. IMPRESSION: Scattered atherosclerotic calcifications involving the thoracic and abdominal aorta, coronary arteries, and abdominal visceral artery origins. No evidence of aortic aneurysm or dissection. 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 No acute intrathoracic abnormalities. Two RIGHT pelvic fluid collections are identified: 6.6 x 4.2 x 7.2 cm lateral to and abutting the RIGHT greater trochanter question trochanteric bursitis; and a second collection posteriorly overlying and extending caudal to the RIGHT ischial spine 8.8 x 5.2 x >4.4 cm in size, with overlying skin thickening, subcutaneous infiltration, and potential mild ischial destruction, highly suspicious for osteomyelitis and overlying abscess, potentially related to decubitus ulcer? Electronically Signed   By: Ulyses Southward M.D.   On: 04/17/2017 09:32   Ct Image Guided Drainage By Percutaneous Catheter  Result Date: 04/17/2017 INDICATION: 77 year old with large fluid collections in the right pelvis. One collection in the right trochanteric region and a second collection in the right inferior gluteal region adjacent to the ischial tuberosity. Concern for underlying infection. EXAM: CT-GUIDED DRAIN PLACEMENT WITHIN THE RIGHT ISCHIAL FLUID COLLECTION CT-GUIDED ASPIRATION OF THE RIGHT FEMORAL TROCHANTERIC FLUID COLLECTION MEDICATIONS: The patient is currently admitted to the hospital and receiving intravenous antibiotics. The antibiotics were administered within an appropriate time frame prior to the initiation of the procedure. ANESTHESIA/SEDATION: 1.5 mg IV Versed 75 mcg IV Fentanyl Moderate Sedation Time:  42 minutes The patient was continuously monitored during the procedure by the interventional radiology nurse under my direct supervision. COMPLICATIONS: None immediate. TECHNIQUE: Informed written consent was obtained from the patient after a thorough discussion of the procedural risks, benefits and alternatives. All  questions were addressed. Maximal Sterile Barrier Technique was utilized including caps, mask, sterile gowns, sterile gloves, sterile drape, hand hygiene and skin antiseptic. A timeout was performed prior to the initiation of the procedure. PROCEDURE: Patient was placed prone. Images through the lower pelvis were obtained. Skin was marked over the right ischial and right trochanteric fluid collections. Right ischial region was prepped and draped in a sterile fashion. Skin was anesthetized with 1% lidocaine. 18 gauge trocar needle was directed into this collection with CT guidance and bloody fluid was aspirated. A stiff Amplatz wire was advanced into the collection. The tract was dilated to accommodate a 10 [place] drain. 100 mL of bloody fluid was removed. No additional fluid could be aspirated. Follow up CT images were obtained at this point. Fluid sample collected for culture. The drain was left in place at this time. Attention was directed to the right trochanteric fluid collection. A new sterile field was created. Skin was anesthetized with 1% lidocaine. 19 gauge Yueh catheter was directed into the right trochanteric fluid collection. 70 mL of bloody fluid was removed. Follow up CT images were obtained and confirmed decompression of the right trochanteric fluid collection. This Yueh catheter was removed. Fluid sample sent for culture. Attention was directed back to the 10 [place] drain. No additional fluid could be removed. Therefore, the drain was cut and completely removed. Bandages placed at both puncture sites. FINDINGS: Heterogeneous collection in the right inferior gluteal region and adjacent to the right ischial tuberosity. The collection looks like a hematoma on these non contrast images. The low-density component of the collection was successfully drained following placement of the 10 French drain. Findings are most compatible with a hematoma. The drain was removed in order to decrease the risk of  hematoma contamination or infection. Bloody fluid was removed from the right femoral trochanteric fluid collection. Majority of the right trochanteric fluid was removed. IMPRESSION: CT-guided drainage of the right trochanteric and right gluteal fluid collections. Findings are most compatible with hematomas. Samples from both collections were sent for culture. 10 French drain was placed within  the right ischial/gluteal collection. However, this drain was removed at the end of the procedure in order to prevent hematoma contamination or infection. Electronically Signed   By: Richarda Overlie M.D.   On: 04/17/2017 16:08     Medical Consultants:    None.  Anti-Infectives:   IV vancomycin and aztreonam.  Subjective:    [name] is lying in bed with mentation improved, she relates some spasms  Objective:    Vitals:   04/18/17 0430 04/18/17 0500 04/18/17 0530 04/18/17 0600  BP: 132/73 (!) 150/68 (!) 106/50 (!) 77/60  Pulse: (!) 153  99 68  Resp: (!) 24 (!) 24 (!) 9 14  Temp:      TempSrc:      SpO2: 100%  99% 100%  Weight:      Height:        Intake/Output Summary (Last 24 hours) at 04/18/2017 0707 Last data filed at 04/18/2017 0600 Gross per 24 hour  Intake 4432.96 ml  Output -  Net 4432.96 ml   Filed Weights   04/17/17 0446  Weight: 83.9 kg (185 lb)    Exam: General exam: In no acute distress. Respiratory system: Good air movement and clear to auscultation. Cardiovascular system: S1 & S2 heard, RRR. No JVD. Gastrointestinal system: Abdomen is nondistended, soft and nontender.  Central nervous system: Alert and oriented. No focal neurological deficits. Extremities: No pedal edema. Skin: Stage I sacral decubitus ulcer, stage III heel ulcer.   Data Reviewed:    Labs: Basic Metabolic Panel: Recent Labs  Lab 04/17/17 0634 04/18/17 0341  NA 137 140  K 3.2* 3.7  CL 101 110  CO2 22 21*  GLUCOSE 129* 81  BUN 12 15  CREATININE 1.00 0.78  CALCIUM 8.9 8.6*  MG  --   1.5*   GFR Estimated Creatinine Clearance: 59.5 mL/min (by C-G formula based on SCr of 0.78 mg/dL). Liver Function Tests: Recent Labs  Lab 04/17/17 0634 04/18/17 0341  AST 21 16  ALT 10* 11*  ALKPHOS 119 91  BILITOT 0.9 0.6  PROT 6.5 5.3*  ALBUMIN 2.8* 2.2*   No results for input(s): LIPASE, AMYLASE in the last 168 hours. No results for input(s): AMMONIA in the last 168 hours. Coagulation profile Recent Labs  Lab 04/18/17 0341  INR 1.21    CBC: Recent Labs  Lab 04/17/17 0634 04/18/17 0341  WBC 22.9* 15.7*  NEUTROABS 21.4*  --   HGB 10.5* 8.7*  HCT 31.4* 27.0*  MCV 86.5 89.1  PLT 314 288   Cardiac Enzymes: No results for input(s): CKTOTAL, CKMB, CKMBINDEX, TROPONINI in the last 168 hours. BNP (last 3 results) No results for input(s): PROBNP in the last 8760 hours. CBG: Recent Labs  Lab 04/17/17 1718 04/17/17 2114  GLUCAP 97 80   D-Dimer: No results for input(s): DDIMER in the last 72 hours. Hgb A1c: No results for input(s): HGBA1C in the last 72 hours. Lipid Profile: No results for input(s): CHOL, HDL, LDLCALC, TRIG, CHOLHDL, LDLDIRECT in the last 72 hours. Thyroid function studies: No results for input(s): TSH, T4TOTAL, T3FREE, THYROIDAB in the last 72 hours.  Invalid input(s): FREET3 Anemia work up: No results for input(s): VITAMINB12, FOLATE, FERRITIN, TIBC, IRON, RETICCTPCT in the last 72 hours. Sepsis Labs: Recent Labs  Lab 04/17/17 2035 04/17/17 0634 04/17/17 0853 04/18/17 0341  WBC  --  22.9*  --  15.7*  LATICACIDVEN 2.56*  --  1.33  --    Microbiology Recent Results (from the past  240 hour(s))  Blood Culture (routine x 2)     Status: None (Preliminary result)   Collection Time: 04/17/17  6:34 AM  Result Value Ref Range Status   Specimen Description   Final    BLOOD LEFT ANTECUBITAL Performed at [facility], [address], [place], [place] [address]    Special Requests   Final    BOTTLES DRAWN AEROBIC AND  ANAEROBIC Blood Culture adequate volume Performed at  [facility], [address], [place], [place] [address]    Culture  Setup Time   Final    GRAM POSITIVE COCCI IN CHAINS IN BOTH AEROBIC AND ANAEROBIC BOTTLES CRITICAL VALUE NOTED.  VALUE IS CONSISTENT WITH PREVIOUSLY REPORTED AND CALLED VALUE. Performed at [facility], [address], [place], [place] [address]    Culture GRAM POSITIVE COCCI  Final   Report Status PENDING  Incomplete  Blood Culture (routine x 2)     Status: None (Preliminary result)   Collection Time: 04/17/17  6:34 AM  Result Value Ref Range Status   Specimen Description   Final    BLOOD RIGHT ANTECUBITAL Performed at [facility], [address], [place], [place] [address]    Special Requests   Final    BOTTLES DRAWN AEROBIC AND ANAEROBIC Blood Culture adequate volume Performed at [facility], [address], [place], [place] [address]    Culture  Setup Time   Final    GRAM POSITIVE COCCI IN CHAINS IN BOTH AEROBIC AND ANAEROBIC BOTTLES Organism ID to follow CRITICAL RESULT CALLED TO, READ BACK BY AND VERIFIED WITH: B.GREEN,PHARMD AT 2236 ON 04/17/17 BY [email or web address] Performed at [facility], [address] [place], [place] [address]    Culture GRAM POSITIVE COCCI  Final   Report Status PENDING  Incomplete  Blood Culture ID Panel (Reflexed)     Status: Abnormal   Collection Time: 04/17/17  6:34 AM  Result Value Ref Range Status   Enterococcus species NOT DETECTED NOT DETECTED Final   Listeria monocytogenes NOT DETECTED NOT DETECTED Final   Staphylococcus species NOT DETECTED NOT DETECTED Final   Staphylococcus aureus NOT DETECTED NOT DETECTED Final   Streptococcus species DETECTED (A) NOT DETECTED Final    Comment: CRITICAL RESULT CALLED TO, READ BACK BY AND VERIFIED WITH: B.GREEN,PHARMD AT 2236 ON 04/17/17 BY G.MCADOO    Streptococcus agalactiae DETECTED (A) NOT DETECTED Final     Comment: CRITICAL RESULT CALLED TO, READ BACK BY AND VERIFIED WITH: B.GREEN,PHARMD AT 2236 ON 04/17/17 BY G.MCADOO    Streptococcus pneumoniae NOT DETECTED NOT DETECTED Final   Streptococcus pyogenes NOT DETECTED NOT DETECTED Final   Acinetobacter baumannii NOT DETECTED NOT DETECTED Final   Enterobacteriaceae species NOT DETECTED NOT DETECTED Final   Enterobacter cloacae complex NOT DETECTED NOT DETECTED Final   Escherichia coli NOT DETECTED NOT DETECTED Final   Klebsiella oxytoca NOT DETECTED NOT DETECTED Final   Klebsiella pneumoniae NOT DETECTED NOT DETECTED Final   Proteus species NOT DETECTED NOT DETECTED Final   Serratia marcescens NOT DETECTED NOT DETECTED Final   Haemophilus influenzae NOT DETECTED NOT DETECTED Final   Neisseria meningitidis NOT DETECTED NOT DETECTED Final   Pseudomonas aeruginosa NOT DETECTED NOT DETECTED Final   Candida albicans NOT DETECTED NOT DETECTED Final   Candida glabrata NOT DETECTED NOT DETECTED Final   Candida krusei NOT DETECTED NOT DETECTED Final   Candida parapsilosis NOT DETECTED NOT DETECTED Final   Candida tropicalis  NOT DETECTED NOT DETECTED Final    Comment: Performed at [facility], [address], [place], [place] [address]  MRSA PCR Screening     Status: None   Collection Time: 04/17/17 12:56 PM  Result Value Ref Range Status   MRSA by PCR NEGATIVE NEGATIVE Final    Comment:        The GeneXpert MRSA Assay (FDA approved for NASAL specimens only), is one component of a comprehensive MRSA colonization surveillance program. It is not intended to diagnose MRSA infection nor to guide or monitor treatment for MRSA infections. Performed at [facility], [address] [place], [place] [address]   Aerobic/Anaerobic Culture (surgical/deep wound)     Status: None (Preliminary result)   Collection Time: 04/17/17  3:04 PM  Result Value Ref Range Status   Specimen Description WOUND  Final   Special Requests   Final     FLUID FROM RIGHT GLUTEAL/ISCHIAL COLLECTION. Performed at [facility], [address], [place], [place] [address]    Gram Stain   Final    MODERATE WBC PRESENT,BOTH PMN AND MONONUCLEAR NO ORGANISMS SEEN Performed at [facility], [address] [place], [place] [address]    Culture PENDING  Incomplete   Report Status PENDING  Incomplete  Aerobic/Anaerobic Culture (surgical/deep wound)     Status: None (Preliminary result)   Collection Time: 04/17/17  3:06 PM  Result Value Ref Range Status   Specimen Description WOUND  Final   Special Requests   Final    FLUID FROM RIGHT TROCHANTERIC HEMATOMA. Performed at [facility], [address] [address], [place], [place] [address]    Gram Stain   Final    MODERATE WBC PRESENT,BOTH PMN AND MONONUCLEAR NO ORGANISMS SEEN Performed at [facility], [address], [place], [place] [address]    Culture PENDING  Incomplete   Report Status PENDING  Incomplete     Medications:   . aspirin EC  81 mg Oral QPM  . DULoxetine  90 mg Oral Daily  . heparin  5,000 Units Subcutaneous Q8H  . insulin aspart  0-15 Units Subcutaneous TID WC  . insulin aspart  0-5 Units Subcutaneous QHS  . pantoprazole  40 mg Oral Daily  . predniSONE  5 mg Oral Q breakfast   Continuous Infusions: . aztreonam Stopped (04/18/17 0535)  . diltiazem (CARDIZEM) infusion 5 mg/hr (04/18/17 0459)  . vancomycin       LOS: 1 day   Marinda Elk  [facility] Pager [phone]  *Please refer to [email or web address], password [email or web address] to get updated schedule on who will round on this patient, as hospitalists switch teams weekly. If 7PM-7AM, please contact night-coverage at [email or web address], password [email or web address] for any overnight needs.  04/18/2017, 7:07 AM

## 2017-04-19 ENCOUNTER — Inpatient Hospital Stay (HOSPITAL_COMMUNITY): Payer: Medicare Other

## 2017-04-19 DIAGNOSIS — I351 Nonrheumatic aortic (valve) insufficiency: Secondary | ICD-10-CM

## 2017-04-19 DIAGNOSIS — A401 Sepsis due to streptococcus, group B: Secondary | ICD-10-CM | POA: Diagnosis present

## 2017-04-19 DIAGNOSIS — L89151 Pressure ulcer of sacral region, stage 1: Secondary | ICD-10-CM

## 2017-04-19 LAB — URINE CULTURE: Culture: 80000 — AB

## 2017-04-19 LAB — BASIC METABOLIC PANEL
Anion gap: 10 (ref 5–15)
BUN: 20 mg/dL (ref 6–20)
CHLORIDE: 108 mmol/L (ref 101–111)
CO2: 21 mmol/L — AB (ref 22–32)
CREATININE: 0.81 mg/dL (ref 0.44–1.00)
Calcium: 8.8 mg/dL — ABNORMAL LOW (ref 8.9–10.3)
GFR calc non Af Amer: >60 mL/min (ref >60)
Glucose, Bld: 78 mg/dL (ref 65–99)
POTASSIUM: 3.8 mmol/L (ref 3.5–5.1)
Sodium: 139 mmol/L (ref 135–145)

## 2017-04-19 LAB — GLUCOSE, CAPILLARY
GLUCOSE-CAPILLARY: 82 mg/dL (ref 65–99)
GLUCOSE-CAPILLARY: 93 mg/dL (ref 65–99)
Glucose-Capillary: 67 mg/dL (ref 65–99)
Glucose-Capillary: 96 mg/dL (ref 65–99)
Glucose-Capillary: 81 mg/dL (ref 65–99)

## 2017-04-19 LAB — ECHOCARDIOGRAM COMPLETE
Height: 63 in
Weight: 2709.01 [oz_av]

## 2017-04-19 LAB — CULTURE, BLOOD (ROUTINE X 2)
SPECIAL REQUESTS: ADEQUATE
Special Requests: ADEQUATE

## 2017-04-19 LAB — MAGNESIUM: Magnesium: 1.8 mg/dL (ref 1.7–2.4)

## 2017-04-19 MED ORDER — OXYCODONE HCL 5 MG PO TABS: 5.0000 mg | ORAL_TABLET | ORAL | Status: DC | PRN

## 2017-04-19 MED ORDER — GABAPENTIN 400 MG PO CAPS
1200.0000 mg | ORAL_CAPSULE | Freq: Every day | ORAL | Status: DC
  Administered 2017-04-19 – 2017-04-21 (×3): 1200 mg via ORAL
  Filled 2017-04-19 (×3): qty 3

## 2017-04-19 MED ORDER — SODIUM CHLORIDE 0.9 % IV BOLUS
500.0000 mL | Freq: Once | INTRAVENOUS | Status: AC
  Administered 2017-04-19: 500 mL via INTRAVENOUS

## 2017-04-19 MED ORDER — FENTANYL CITRATE (PF) 100 MCG/2ML IJ SOLN: 12.5000 ug | INTRAMUSCULAR | Status: DC | PRN

## 2017-04-19 MED ORDER — GABAPENTIN 300 MG PO CAPS
600.0000 mg | ORAL_CAPSULE | Freq: Every day | ORAL | Status: DC
Start: 2017-04-20 — End: 2017-04-22
  Administered 2017-04-20 – 2017-04-22 (×3): 600 mg via ORAL
  Filled 2017-04-19 (×3): qty 2

## 2017-04-19 MED ORDER — SODIUM CHLORIDE 0.9 % IV SOLN
INTRAVENOUS | Status: AC
  Administered 2017-04-19: 02:00:00 via INTRAVENOUS

## 2017-04-19 MED ORDER — ALBUTEROL SULFATE (2.5 MG/3ML) 0.083% IN NEBU
2.5000 mg | INHALATION_SOLUTION | RESPIRATORY_TRACT | Status: DC | PRN
  Administered 2017-04-20: 2.5 mg via RESPIRATORY_TRACT
  Filled 2017-04-19: qty 3

## 2017-04-19 NOTE — Progress Notes (Signed)
*  PRELIMINARY RESULTS* Echocardiogram 2D Echocardiogram has been performed.  Stacey Drain 04/19/2017, 1:21 PM

## 2017-04-19 NOTE — Progress Notes (Signed)
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   Assessment & Plan:   Principal Problem:   Sepsis due to group B Streptococcus (HCC) Active Problems:   PAF (paroxysmal atrial fibrillation) (HCC)   Psoriatic arthritis (HCC)   Peripheral neuropathy   Atrial fibrillation with RVR (HCC)   Hypertension   GERD (gastroesophageal reflux disease)   Altered mental status   Hip osteomyelitis, right (HCC)   Pressure injury of skin   #) Sepsis due to group B strep: Source is most likely abscesses/fluid collections along her right periarticular apparatus for the hip status post drainage by IR on 04/17/2017 with placement of 10 French drain on right ischial hematoma. -Appreciate IR assistance -Orthopedic consult, appreciate recommendations - Continue vancomycin and aztreonam started 04/17/2017, patient will likely need prolonged course of antibiotics however she has anaphylaxis with penicillins.  Will discuss with family about if she is ever tolerated cephalosporins in the past and if not we will consider fluoroquinolones or oxazolidinones - Blood cultures from 04/17/2017 growing group B strep resistant to clindamycin and macrolides -Repeat blood cultures ordered on 04/19/2017 -Echo ordered to rule out  endocarditis  #) Altered mental status: It is unclear at this time if patient is truly altered as in discussion with the nurse she reports that patient is currently at her baseline.  Will discuss further with the family to evaluate his medical problems that brought her into the hospital are now under control.  If she continues to be altered consideration of possible meningitis might have to be entertained particularly with her immunosuppression.  #) Atrial fibrillation with rapid ventricular response: Patient was initially on diltiazem drip which was then discontinued. -Continue home metoprolol succinate 100 mg daily -Continue home diltiazem extended release 180 mg nightly -Telemetry -PRN IV metoprolol for heart rate sustained greater than 130 -Patient is not a candidate for anticoagulation due to prior intracranial hemorrhages and falls  #) possible UTI: Patient's urine is growing E. coli -Continue aztreonam  #) Psoriatic arthritis: -Continue home prednisone 5 mg, will give stress dose hydrocortisone if patient becomes ill again - Hold home apremilast 30mg  BID d/t sepsis  #) Chronic pain: Patient continues to moan in pain when woken up.  It is unclear if this is her baseline or not. -Continue duloxetine 90 mg daily -Continue Norco every 4 hours as needed - We will restart home gabapentin 600 mg every morning and 1200 mg nightly  #) Hypertension: - Continue aspirin 81 mg -Hold home losartan 100 mg -Hold home furosemide 80 mg daily  #) Osteoporosis: Likely secondary to chronic steroid use for psoriatic arthritis -Hold home teriparatide -Hold home vitamin D supplementation  Fluids: Tolerating p.o. Elect lites: Monitor and supplement Nutrition: Carb restricted diet  Prophylaxis: Subcu heparin  Disposition: Pending baseline mental status and discussion of IV antibiotics and clearance of infection  Full code  Consultants:   Orthopedics  Interventional radiology  Procedures:  (  Don't include imaging studies which can be auto populated. Include things that cannot be auto populated i.e. Echo, Carotid and venous dopplers, Foley, Bipap, HD, tubes/drains, wound vac, central lines etc)  04/17/2017 drainage of right trochanteric hematoma and 10 French drain placed in right ischial hematoma  Antimicrobials: (specify start and planned stop date. Auto populated tables are space occupying and do not give end dates)  IV aztreonam and vancomycin started 04/17/2017   Subjective: Patient is sleeping in bed.  When awoken she only complains of pain.  She cannot answer questions and is alert oriented to situation, place, time.  She does respond to her name.  Objective: Vitals:   04/19/17 0800 04/19/17 0900 04/19/17 0940 04/19/17 1000  BP: 101/66 (!) 109/55 (!) 114/51 (!) 108/44  Pulse: (!) 55 76 80 (!) 153  Resp: 15 14  11   Temp:      TempSrc:      SpO2: 99% 100%  99%  Weight:      Height:        Intake/Output Summary (Last 24 hours) at 04/19/2017 1040 Last data filed at 04/19/2017 0630 Gross per 24 hour  Intake 2659.98 ml  Output 240 ml  Net 2419.98 ml   Filed Weights   04/17/17 0446 04/19/17 0500  Weight: 83.9 kg (185 lb) 76.8 kg (169 lb 5 oz)    Examination:  General exam: Sleeping, very uncomfortable when awoken Respiratory system: Clear to auscultation. Respiratory effort normal.  No wheezes, rhonchi, rales Cardiovascular system: Irregularly irregular, no murmurs. Gastrointestinal system: Soft, nondistended, plus bowel sounds Central nervous system: Alert only to herself.  Moving all extremities Extremities: Trace lower extremity edema, significant pain when moving leg Skin: No rashes on visible skin Psychiatry: Unable to assess due to patient's mental status    Data Reviewed: I have personally reviewed following labs and imaging studies  CBC: Recent Labs  Lab 04/17/17 0634 04/18/17 0341  WBC 22.9* 15.7*  NEUTROABS 21.4*  --   HGB 10.5* 8.7*  HCT  31.4* 27.0*  MCV 86.5 89.1  PLT 314 288   Basic Metabolic Panel: Recent Labs  Lab 04/17/17 0634 04/18/17 0341 04/19/17 0310  NA 137 140 139  K 3.2* 3.7 3.8  CL 101 110 108  CO2 22 21* 21*  GLUCOSE 129* 81 78  BUN 12 15 20   CREATININE 1.00 0.78 0.81  CALCIUM 8.9 8.6* 8.8*  MG  --  1.5* 1.8   GFR: Estimated Creatinine Clearance: 56.2 mL/min (by C-G formula based on SCr of 0.81 mg/dL). Liver Function Tests: Recent Labs  Lab 04/17/17 0634 04/18/17 0341  AST 21 16  ALT 10* 11*  ALKPHOS 119 91  BILITOT 0.9 0.6  PROT 6.5 5.3*  ALBUMIN 2.8* 2.2*   No results for input(s): LIPASE, AMYLASE in the last 168 hours. No results for input(s): AMMONIA in the last 168 hours. Coagulation Profile: Recent Labs  Lab 04/18/17 0341  INR 1.21   Cardiac Enzymes: No results for input(s): CKTOTAL, CKMB, CKMBINDEX, TROPONINI in the last 168 hours. BNP (last 3 results) No results for input(s): PROBNP in the last 8760 hours. HbA1C: No results for input(s): HGBA1C in the last 72 hours. CBG: Recent Labs  Lab 04/18/17 1637 04/18/17 2222 04/18/17 2303 04/19/17 0754 04/19/17 0939  GLUCAP 92 65 115* 67 82   Lipid Profile: No results for input(s): CHOL, HDL, LDLCALC, TRIG, CHOLHDL, LDLDIRECT in the last 72 hours. Thyroid Function Tests: No results for input(s): TSH, T4TOTAL, FREET4, T3FREE, THYROIDAB in  the last 72 hours. Anemia Panel: No results for input(s): VITAMINB12, FOLATE, FERRITIN, TIBC, IRON, RETICCTPCT in the last 72 hours. Sepsis Labs: Recent Labs  Lab 04/17/17 8032 04/17/17 0853  LATICACIDVEN 2.56* 1.33    Recent Results (from the past 240 hour(s))  Blood Culture (routine x 2)     Status: Abnormal   Collection Time: 04/17/17  6:34 AM  Result Value Ref Range Status   Specimen Description   Final    BLOOD LEFT ANTECUBITAL Performed at [facility], [address], [place], [place] [address]    Special Requests   Final    BOTTLES DRAWN AEROBIC  AND ANAEROBIC Blood Culture adequate volume Performed at [facility], [address] [place], [place] [address]    Culture  Setup Time   Final    GRAM POSITIVE COCCI IN CHAINS IN BOTH AEROBIC AND ANAEROBIC BOTTLES CRITICAL VALUE NOTED.  VALUE IS CONSISTENT WITH PREVIOUSLY REPORTED AND CALLED VALUE.    Culture (A)  Final    GROUP B STREP(S.AGALACTIAE)ISOLATED SUSCEPTIBILITIES PERFORMED ON PREVIOUS CULTURE WITHIN THE LAST 5 DAYS. Performed at [facility], [address] [place], [place] [address]    Report Status 04/19/2017 FINAL  Final  Blood Culture (routine x 2)     Status: Abnormal   Collection Time: 04/17/17  6:34 AM  Result Value Ref Range Status   Specimen Description   Final    BLOOD RIGHT ANTECUBITAL Performed at [facility], [address], [place], [place] [address]    Special Requests   Final    BOTTLES DRAWN AEROBIC AND ANAEROBIC Blood Culture adequate volume Performed at [facility] [address] [place], [place] [address]    Culture  Setup Time   Final    GRAM POSITIVE COCCI IN CHAINS IN BOTH AEROBIC AND ANAEROBIC BOTTLES CRITICAL RESULT CALLED TO, READ BACK BY AND VERIFIED WITH: B.GREEN,PHARMD AT 2236 ON 04/17/17 BY G.[email or web address] Performed at [facility], [address] [place], [place] [address]    Culture GROUP B STREP(S.AGALACTIAE)ISOLATED (A)  Final   Report Status 04/19/2017 FINAL  Final   Organism ID, Bacteria GROUP B STREP(S.AGALACTIAE)ISOLATED  Final      Susceptibility   Group b strep(s.agalactiae)isolated - MIC*    CLINDAMYCIN >=1 RESISTANT Resistant     AMPICILLIN <=0.25 SENSITIVE Sensitive     ERYTHROMYCIN >=8 RESISTANT Resistant     VANCOMYCIN 0.25 SENSITIVE Sensitive     CEFTRIAXONE <=0.12 SENSITIVE Sensitive     LEVOFLOXACIN 1 SENSITIVE Sensitive     * GROUP B STREP(S.AGALACTIAE)ISOLATED  Blood Culture ID Panel (Reflexed)     Status: Abnormal   Collection Time: 04/17/17   6:34 AM  Result Value Ref Range Status   Enterococcus species NOT DETECTED NOT DETECTED Final   Listeria monocytogenes NOT DETECTED NOT DETECTED Final   Staphylococcus species NOT DETECTED NOT DETECTED Final   Staphylococcus aureus NOT DETECTED NOT DETECTED Final   Streptococcus species DETECTED (A) NOT DETECTED Final    Comment: CRITICAL RESULT CALLED TO, READ BACK BY AND VERIFIED WITH: B.GREEN,PHARMD AT 2236 ON 04/17/17 BY G.MCADOO    Streptococcus agalactiae DETECTED (A) NOT DETECTED Final    Comment: CRITICAL RESULT CALLED TO, READ BACK BY AND VERIFIED WITH: B.GREEN,PHARMD AT 2236 ON 04/17/17 BY G.MCADOO    Streptococcus pneumoniae NOT DETECTED NOT DETECTED Final   Streptococcus pyogenes NOT DETECTED NOT DETECTED Final   Acinetobacter baumannii NOT DETECTED NOT DETECTED Final  Enterobacteriaceae species NOT DETECTED NOT DETECTED Final   Enterobacter cloacae complex NOT DETECTED NOT DETECTED Final   Escherichia coli NOT DETECTED NOT DETECTED Final   Klebsiella oxytoca NOT DETECTED NOT DETECTED Final   Klebsiella pneumoniae NOT DETECTED NOT DETECTED Final   Proteus species NOT DETECTED NOT DETECTED Final   Serratia marcescens NOT DETECTED NOT DETECTED Final   Haemophilus influenzae NOT DETECTED NOT DETECTED Final   Neisseria meningitidis NOT DETECTED NOT DETECTED Final   Pseudomonas aeruginosa NOT DETECTED NOT DETECTED Final   Candida albicans NOT DETECTED NOT DETECTED Final   Candida glabrata NOT DETECTED NOT DETECTED Final   Candida krusei NOT DETECTED NOT DETECTED Final   Candida parapsilosis NOT DETECTED NOT DETECTED Final   Candida tropicalis NOT DETECTED NOT DETECTED Final    Comment: Performed at [facility], [address] [place], [place] [address]  Urine culture     Status: Abnormal   Collection Time: 04/17/17  6:49 AM  Result Value Ref Range Status   Specimen Description   Final    URINE, CATHETERIZED Performed at [facility], [address] [place], [place] [address]    Special Requests   Final    NONE Performed at [facility], [address], [place], [place] [address]    Culture 80,000 COLONIES/mL ESCHERICHIA COLI (A)  Final   Report Status 04/19/2017 FINAL  Final   Organism ID, Bacteria ESCHERICHIA COLI (A)  Final      Susceptibility   Escherichia coli - MIC*    AMPICILLIN 8 SENSITIVE Sensitive     CEFAZOLIN <=4 SENSITIVE Sensitive     CEFTRIAXONE <=1 SENSITIVE Sensitive     CIPROFLOXACIN <=0.25 SENSITIVE Sensitive     GENTAMICIN <=1 SENSITIVE Sensitive     IMIPENEM <=0.25 SENSITIVE Sensitive     NITROFURANTOIN <=16 SENSITIVE Sensitive     TRIMETH/SULFA <=20 SENSITIVE Sensitive     AMPICILLIN/SULBACTAM 4 SENSITIVE Sensitive     PIP/TAZO <=4 SENSITIVE Sensitive     Extended ESBL NEGATIVE Sensitive     * 80,000 COLONIES/mL ESCHERICHIA COLI  MRSA PCR Screening     Status: None   Collection Time: 04/17/17 12:56 PM  Result Value Ref Range Status   MRSA by PCR NEGATIVE NEGATIVE Final    Comment:        The GeneXpert MRSA Assay (FDA approved for NASAL specimens only), is one component of a comprehensive MRSA colonization surveillance program. It is not intended to diagnose MRSA infection nor to guide or monitor treatment for MRSA infections. Performed at [facility], [address] [place], [place] [address]   Aerobic/Anaerobic Culture (surgical/deep wound)     Status: None (Preliminary result)   Collection Time: 04/17/17  3:04 PM  Result Value Ref Range Status   Specimen Description WOUND  Final   Special Requests   Final    FLUID FROM RIGHT GLUTEAL/ISCHIAL COLLECTION. Performed at [facility], [address] [place], [place] [address]    Gram Stain   Final    MODERATE WBC PRESENT,BOTH PMN AND MONONUCLEAR NO ORGANISMS SEEN    Culture   Final    NO GROWTH < 24 HOURS Performed at [facility], [address], [place], [place]  [address]    Report Status PENDING  Incomplete  Aerobic/Anaerobic Culture (surgical/deep wound)     Status: None (Preliminary result)   Collection Time: 04/17/17  3:06 PM  Result Value Ref Range  Status   Specimen Description WOUND  Final   Special Requests   Final    FLUID FROM RIGHT TROCHANTERIC HEMATOMA. Performed at [facility], [address], [place], [place] [address]    Gram Stain   Final    MODERATE WBC PRESENT,BOTH PMN AND MONONUCLEAR NO ORGANISMS SEEN    Culture   Final    NO GROWTH < 24 HOURS Performed at [facility], [address] [place], [place] [address]    Report Status PENDING  Incomplete         Radiology Studies: Ct Aspiration  Result Date: 04/17/2017 INDICATION: 77 year old with large fluid collections in the right pelvis. One collection in the right trochanteric region and a second collection in the right inferior gluteal region adjacent to the ischial tuberosity. Concern for underlying infection. EXAM: CT-GUIDED DRAIN PLACEMENT WITHIN THE RIGHT ISCHIAL FLUID COLLECTION CT-GUIDED ASPIRATION OF THE RIGHT FEMORAL TROCHANTERIC FLUID COLLECTION MEDICATIONS: The patient is currently admitted to the hospital and receiving intravenous antibiotics. The antibiotics were administered within an appropriate time frame prior to the initiation of the procedure. ANESTHESIA/SEDATION: 1.5 mg IV Versed 75 mcg IV Fentanyl Moderate Sedation Time:  42 minutes The patient was continuously monitored during the procedure by the interventional radiology nurse under my direct supervision. COMPLICATIONS: None immediate. TECHNIQUE: Informed written consent was obtained from the patient after a thorough discussion of the procedural risks, benefits and alternatives. All questions were addressed. Maximal Sterile Barrier Technique was utilized including caps, mask, sterile gowns, sterile gloves, sterile drape, hand hygiene and skin antiseptic. A timeout was performed prior  to the initiation of the procedure. PROCEDURE: Patient was placed prone. Images through the lower pelvis were obtained. Skin was marked over the right ischial and right trochanteric fluid collections. Right ischial region was prepped and draped in a sterile fashion. Skin was anesthetized with 1% lidocaine. 18 gauge trocar needle was directed into this collection with CT guidance and bloody fluid was aspirated. A stiff Amplatz wire was advanced into the collection. The tract was dilated to accommodate a 10 [place] drain. 100 mL of bloody fluid was removed. No additional fluid could be aspirated. Follow up CT images were obtained at this point. Fluid sample collected for culture. The drain was left in place at this time. Attention was directed to the right trochanteric fluid collection. A new sterile field was created. Skin was anesthetized with 1% lidocaine. 19 gauge Yueh catheter was directed into the right trochanteric fluid collection. 70 mL of bloody fluid was removed. Follow up CT images were obtained and confirmed decompression of the right trochanteric fluid collection. This Yueh catheter was removed. Fluid sample sent for culture. Attention was directed back to the 10 [place] drain. No additional fluid could be removed. Therefore, the drain was cut and completely removed. Bandages placed at both puncture sites. FINDINGS: Heterogeneous collection in the right inferior gluteal region and adjacent to the right ischial tuberosity. The collection looks like a hematoma on these non contrast images. The low-density component of the collection was successfully drained following placement of the 10 French drain. Findings are most compatible with a hematoma. The drain was removed in order to decrease the risk of hematoma contamination or infection. Bloody fluid was removed from the right femoral trochanteric fluid collection. Majority of the right trochanteric fluid was removed. IMPRESSION: CT-guided drainage of the  right trochanteric and right gluteal fluid collections. Findings are most compatible with hematomas. Samples from both collections were sent for  culture. 10 French drain was placed within the right ischial/gluteal collection. However, this drain was removed at the end of the procedure in order to prevent hematoma contamination or infection. Electronically Signed   By: Richarda Overlie M.D.   On: 04/17/2017 16:08   Ct Image Guided Drainage By Percutaneous Catheter  Result Date: 04/17/2017 INDICATION: 77 year old with large fluid collections in the right pelvis. One collection in the right trochanteric region and a second collection in the right inferior gluteal region adjacent to the ischial tuberosity. Concern for underlying infection. EXAM: CT-GUIDED DRAIN PLACEMENT WITHIN THE RIGHT ISCHIAL FLUID COLLECTION CT-GUIDED ASPIRATION OF THE RIGHT FEMORAL TROCHANTERIC FLUID COLLECTION MEDICATIONS: The patient is currently admitted to the hospital and receiving intravenous antibiotics. The antibiotics were administered within an appropriate time frame prior to the initiation of the procedure. ANESTHESIA/SEDATION: 1.5 mg IV Versed 75 mcg IV Fentanyl Moderate Sedation Time:  42 minutes The patient was continuously monitored during the procedure by the interventional radiology nurse under my direct supervision. COMPLICATIONS: None immediate. TECHNIQUE: Informed written consent was obtained from the patient after a thorough discussion of the procedural risks, benefits and alternatives. All questions were addressed. Maximal Sterile Barrier Technique was utilized including caps, mask, sterile gowns, sterile gloves, sterile drape, hand hygiene and skin antiseptic. A timeout was performed prior to the initiation of the procedure. PROCEDURE: Patient was placed prone. Images through the lower pelvis were obtained. Skin was marked over the right ischial and right trochanteric fluid collections. Right ischial region was prepped and  draped in a sterile fashion. Skin was anesthetized with 1% lidocaine. 18 gauge trocar needle was directed into this collection with CT guidance and bloody fluid was aspirated. A stiff Amplatz wire was advanced into the collection. The tract was dilated to accommodate a 10 [place] drain. 100 mL of bloody fluid was removed. No additional fluid could be aspirated. Follow up CT images were obtained at this point. Fluid sample collected for culture. The drain was left in place at this time. Attention was directed to the right trochanteric fluid collection. A new sterile field was created. Skin was anesthetized with 1% lidocaine. 19 gauge Yueh catheter was directed into the right trochanteric fluid collection. 70 mL of bloody fluid was removed. Follow up CT images were obtained and confirmed decompression of the right trochanteric fluid collection. This Yueh catheter was removed. Fluid sample sent for culture. Attention was directed back to the 10 [place] drain. No additional fluid could be removed. Therefore, the drain was cut and completely removed. Bandages placed at both puncture sites. FINDINGS: Heterogeneous collection in the right inferior gluteal region and adjacent to the right ischial tuberosity. The collection looks like a hematoma on these non contrast images. The low-density component of the collection was successfully drained following placement of the 10 French drain. Findings are most compatible with a hematoma. The drain was removed in order to decrease the risk of hematoma contamination or infection. Bloody fluid was removed from the right femoral trochanteric fluid collection. Majority of the right trochanteric fluid was removed. IMPRESSION: CT-guided drainage of the right trochanteric and right gluteal fluid collections. Findings are most compatible with hematomas. Samples from both collections were sent for culture. 10 French drain was placed within the right ischial/gluteal collection. However, this  drain was removed at the end of the procedure in order to prevent hematoma contamination or infection. Electronically Signed   By: Richarda Overlie M.D.   On: 04/17/2017 16:08  Scheduled Meds: . aspirin EC  81 mg Oral QPM  . diltiazem  180 mg Oral QHS  . DULoxetine  90 mg Oral Daily  . heparin  5,000 Units Subcutaneous Q8H  . insulin aspart  0-15 Units Subcutaneous TID WC  . insulin aspart  0-5 Units Subcutaneous QHS  . metoprolol succinate  100 mg Oral Daily  . pantoprazole  40 mg Oral Daily  . predniSONE  5 mg Oral Q breakfast   Continuous Infusions: . aztreonam Stopped (04/19/17 0700)  . vancomycin Stopped (04/18/17 1046)     LOS: 2 days    Time spent: 45    Delaine Lame, MD [facility]  If 7PM-7AM, please contact night-coverage [email or web address] Password [email or web address] 04/19/2017, 10:40 AM

## 2017-04-20 DIAGNOSIS — Z880 Allergy status to penicillin: Secondary | ICD-10-CM

## 2017-04-20 DIAGNOSIS — Z87891 Personal history of nicotine dependence: Secondary | ICD-10-CM

## 2017-04-20 DIAGNOSIS — M1990 Unspecified osteoarthritis, unspecified site: Secondary | ICD-10-CM

## 2017-04-20 DIAGNOSIS — M7061 Trochanteric bursitis, right hip: Secondary | ICD-10-CM

## 2017-04-20 DIAGNOSIS — I4891 Unspecified atrial fibrillation: Secondary | ICD-10-CM

## 2017-04-20 DIAGNOSIS — M549 Dorsalgia, unspecified: Secondary | ICD-10-CM

## 2017-04-20 DIAGNOSIS — L89151 Pressure ulcer of sacral region, stage 1: Secondary | ICD-10-CM

## 2017-04-20 DIAGNOSIS — I272 Pulmonary hypertension, unspecified: Secondary | ICD-10-CM

## 2017-04-20 DIAGNOSIS — E1142 Type 2 diabetes mellitus with diabetic polyneuropathy: Secondary | ICD-10-CM

## 2017-04-20 DIAGNOSIS — B9689 Other specified bacterial agents as the cause of diseases classified elsewhere: Secondary | ICD-10-CM

## 2017-04-20 DIAGNOSIS — E11622 Type 2 diabetes mellitus with other skin ulcer: Secondary | ICD-10-CM

## 2017-04-20 DIAGNOSIS — A401 Sepsis due to streptococcus, group B: Principal | ICD-10-CM

## 2017-04-20 DIAGNOSIS — B9620 Unspecified Escherichia coli [E. coli] as the cause of diseases classified elsewhere: Secondary | ICD-10-CM

## 2017-04-20 DIAGNOSIS — L89613 Pressure ulcer of right heel, stage 3: Secondary | ICD-10-CM

## 2017-04-20 DIAGNOSIS — R7881 Bacteremia: Secondary | ICD-10-CM

## 2017-04-20 DIAGNOSIS — M24674 Ankylosis, right foot: Secondary | ICD-10-CM

## 2017-04-20 DIAGNOSIS — N390 Urinary tract infection, site not specified: Secondary | ICD-10-CM

## 2017-04-20 LAB — BASIC METABOLIC PANEL WITH GFR
Anion gap: 8 (ref 5–15)
CO2: 20 mmol/L — ABNORMAL LOW (ref 22–32)
Chloride: 109 mmol/L (ref 101–111)
GFR calc Af Amer: >60 mL/min (ref >60)
Glucose, Bld: 81 mg/dL (ref 65–99)

## 2017-04-20 LAB — GLUCOSE, CAPILLARY
GLUCOSE-CAPILLARY: 86 mg/dL (ref 65–99)
GLUCOSE-CAPILLARY: 92 mg/dL (ref 65–99)
Glucose-Capillary: 76 mg/dL (ref 65–99)
Glucose-Capillary: 74 mg/dL (ref 65–99)

## 2017-04-20 LAB — CBC
HCT: 27.4 % — ABNORMAL LOW (ref 36.0–46.0)
Hemoglobin: 8.8 g/dL — ABNORMAL LOW (ref 12.0–15.0)
MCH: 28.2 pg (ref 26.0–34.0)
MCHC: 32.1 g/dL (ref 30.0–36.0)
MCV: 87.8 fL (ref 78.0–100.0)
Platelets: 237 10*3/uL (ref 150–400)
RBC: 3.12 MIL/uL — ABNORMAL LOW (ref 3.87–5.11)
RDW: 15.8 % — ABNORMAL HIGH (ref 11.5–15.5)
WBC: 10.3 10*3/uL (ref 4.0–10.5)

## 2017-04-20 LAB — BASIC METABOLIC PANEL
BUN: 18 mg/dL (ref 6–20)
Calcium: 8.5 mg/dL — ABNORMAL LOW (ref 8.9–10.3)
Creatinine, Ser: 0.65 mg/dL (ref 0.44–1.00)
GFR calc non Af Amer: >60 mL/min (ref >60)
Potassium: 3.5 mmol/L (ref 3.5–5.1)
Sodium: 137 mmol/L (ref 135–145)

## 2017-04-20 MED ORDER — LEVOFLOXACIN IN D5W 750 MG/150ML IV SOLN
750.0000 mg | INTRAVENOUS | Status: DC
  Administered 2017-04-20 – 2017-04-22 (×3): 750 mg via INTRAVENOUS
  Filled 2017-04-20 (×3): qty 150

## 2017-04-20 MED ORDER — METOPROLOL SUCCINATE ER 25 MG PO TB24
50.0000 mg | ORAL_TABLET | Freq: Every day | ORAL | Status: DC
  Administered 2017-04-21 – 2017-04-22 (×2): 50 mg via ORAL
  Filled 2017-04-20 (×2): qty 2

## 2017-04-20 MED ORDER — DILTIAZEM HCL ER COATED BEADS 180 MG PO CP24
180.0000 mg | ORAL_CAPSULE | Freq: Every day | ORAL | Status: DC
  Administered 2017-04-21: 180 mg via ORAL
  Filled 2017-04-20: qty 1

## 2017-04-20 NOTE — Progress Notes (Signed)
PT Cancellation Note  Patient Details Name: Andrea Yu MRN: [id number] DOB: Jun 13, 1940   Cancelled Treatment:    Reason Eval/Treat Not Completed: Fatigue/lethargy limiting ability to participate, the patient reports that she just had linens changed and was fatigued. The  Patient  Reports that she uses a scooter and has been non ambulatory PTA. Patient agreeable to try mobility later.    Rada Hay 04/20/2017, 12:54 PM Blanchard Kelch PT [phone]

## 2017-04-20 NOTE — Progress Notes (Signed)
CCMD called and told me that pt HR has dropped in the low 30s but does come back up and that the pauses continues. RN texted paged MD with this information.

## 2017-04-20 NOTE — Progress Notes (Signed)
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 S/p drainage by IR on 04/17/2017, findings compatible with hematomas, drain removed  -Fluid cultures negative to date  -Blood culture 3/24 with group B strep  -Repeat blood culture 3/26 negative < 24 hours  -Currently on vancomycin. ID consulted  -Echo without vegetation, ?TEE  -Report of PCN allergy and anaphylaxis. No history of cephalosporin use; none recalled by patient and pharmacy could not find records of cephalosporin  toleration   Acute encephalopathy -Appears to have resolved  Atrial fibrillation with rapid ventricular response -Rate well controlled this morning in the 80s -Continue metoprolol, diltiazem, aspirin  -Patient is not a candidate for anticoagulation due to prior intracranial hemorrhages and falls  ?UTI vs asymptomatic bacteruria  -Urine culture with E Coli  -Continue aztreonam  Psoriatic arthritis -Continue home prednisone 5 mg, continue PPI  -Hold home apremilast 30mg  BID due to sepsis  Chronic pain -Continue duloxetine, norco, gabapentin, robaxin   Hypertension -Hold home losartan, furosemide due to marginal BP   DM -SSI   Osteoporosis -Hold home teriparatide -Hold home vitamin D supplementation   DVT prophylaxis: Subq hep Code Status: Full Family Communication: No family at bedside Disposition Plan: Pending improvement, further work up per ID, antibiotic recommendations    Consultants:   Orthopedic surgery  IR  ID   Procedures:   04/17/2017 drainage of right trochanteric/ischial tuberosity hematoma   Antimicrobials:  Anti-infectives (From admission, onward)   Start     Dose/Rate Route Frequency Ordered Stop   04/18/17 0800  vancomycin (VANCOCIN) IVPB 750 mg/150 ml premix     750 mg 150 mL/hr over 60 Minutes Intravenous Every 24 hours 04/17/17 1157     04/17/17 1400  aztreonam (AZACTAM) 2 GM IVPB     2 g 100 mL/hr over 30 Minutes Intravenous Every 8 hours 04/17/17 1157     04/17/17 0630  levofloxacin (LEVAQUIN) IVPB 750 mg     750 mg 100 mL/hr over 90 Minutes Intravenous  Once 04/17/17 0616 04/17/17 0813   04/17/17 0630  aztreonam (AZACTAM) 2 GM IVPB     2 g 100 mL/hr over 30 Minutes Intravenous  Once  04/17/17 0616 04/17/17 0647   04/17/17 0630  vancomycin (VANCOCIN) IVPB 1000 mg/200 mL premix  Status:  Discontinued     1,000 mg 200 mL/hr over 60 Minutes Intravenous  Once 04/17/17 0616 04/17/17 0620   04/17/17 0630  vancomycin (VANCOCIN)  2,000 mg in sodium chloride 0.9 % 500 mL IVPB     2,000 mg 250 mL/hr over 120 Minutes Intravenous  Once 04/17/17 0621 04/17/17 1137       Subjective: Complains of low back pain.  She states that she had a spinal fusion in her lower back back in 2005 and has had chronic back pain since then.  No fevers overnight, no complaints of chest pain or shortness of breath.  Objective: Vitals:   04/20/17 0900 04/20/17 1009 04/20/17 1100 04/20/17 1200  BP: (!) 125/56 (!) 125/56 (!) 116/42   Pulse: 79 79 75   Resp: 16  15   Temp:    99.2 F (37.3 C)  TempSrc:    Oral  SpO2: 99%  100%   Weight:      Height:        Intake/Output Summary (Last 24 hours) at 04/20/2017 1247 Last data filed at 04/20/2017 0531 Gross per 24 hour  Intake 300 ml  Output 250 ml  Net 50 ml   Filed Weights   04/17/17 0446 04/19/17 0500 04/20/17 0500  Weight: 83.9 kg (185 lb) 76.8 kg (169 lb 5 oz) 78.9 kg (173 lb 15.1 oz)    Examination:  General exam: Appears calm and comfortable  Respiratory system: Clear to auscultation. Respiratory effort normal. Cardiovascular system: S1 & S2 heard, Irreg rhythm rate 80s. No JVD, murmurs, rubs, gallops or clicks. No pedal edema. Gastrointestinal system: Abdomen is nondistended, soft and nontender. No organomegaly or masses felt. Normal bowel sounds heard. Central nervous system: Alert and oriented. No focal neurological deficits. Extremities: Symmetric Skin: No rashes, lesions or ulcers Psychiatry: Judgement and insight appear normal. Mood & affect appropriate.   Data Reviewed: I have personally reviewed following labs and imaging studies  CBC: Recent Labs  Lab 04/17/17 0634 04/18/17 0341 04/20/17 0257  WBC 22.9* 15.7* 10.3  NEUTROABS 21.4*  --   --   HGB 10.5* 8.7* 8.8*  HCT 31.4* 27.0* 27.4*  MCV 86.5 89.1 87.8  PLT 314 288 237   Basic Metabolic Panel: Recent Labs  Lab 04/17/17 0634 04/18/17 0341 04/19/17 0310 04/20/17 0257  NA 137 140 139 137  K 3.2*  3.7 3.8 3.5  CL 101 110 108 109  CO2 22 21* 21* 20*  GLUCOSE 129* 81 78 81  BUN 12 15 20 18   CREATININE 1.00 0.78 0.81 0.65  CALCIUM 8.9 8.6* 8.8* 8.5*  MG  --  1.5* 1.8  --    GFR: Estimated Creatinine Clearance: 57.6 mL/min (by C-G formula based on SCr of 0.65 mg/dL). Liver Function Tests: Recent Labs  Lab 04/17/17 0634 04/18/17 0341  AST 21 16  ALT 10* 11*  ALKPHOS 119 91  BILITOT 0.9 0.6  PROT 6.5 5.3*  ALBUMIN 2.8* 2.2*   No results for input(s): LIPASE, AMYLASE in the last 168 hours. No results for input(s): AMMONIA in the last 168 hours. Coagulation Profile: Recent Labs  Lab 04/18/17 0341  INR 1.21   Cardiac Enzymes: No results for input(s): CKTOTAL, CKMB, CKMBINDEX, TROPONINI in the last 168 hours. BNP (last 3 results) No results for input(s): PROBNP in the last 8760 hours. HbA1C: No results for input(s): HGBA1C in the last 72  hours. CBG: Recent Labs  Lab 04/19/17 0939 04/19/17 1153 04/19/17 1628 04/19/17 2157 04/20/17 0807  GLUCAP 82 93 96 81 76   Lipid Profile: No results for input(s): CHOL, HDL, LDLCALC, TRIG, CHOLHDL, LDLDIRECT in the last 72 hours. Thyroid Function Tests: No results for input(s): TSH, T4TOTAL, FREET4, T3FREE, THYROIDAB in the last 72 hours. Anemia Panel: No results for input(s): VITAMINB12, FOLATE, FERRITIN, TIBC, IRON, RETICCTPCT in the last 72 hours. Sepsis Labs: Recent Labs  Lab 04/17/17 7322 04/17/17 0853  LATICACIDVEN 2.56* 1.33    Recent Results (from the past 240 hour(s))  Blood Culture (routine x 2)     Status: Abnormal   Collection Time: 04/17/17  6:34 AM  Result Value Ref Range Status   Specimen Description   Final    BLOOD LEFT ANTECUBITAL Performed at [facility], [address] [place], [place] [address]    Special Requests   Final    BOTTLES DRAWN AEROBIC AND ANAEROBIC Blood Culture adequate volume Performed at [facility], [address], [place], [place]  [address]    Culture  Setup Time   Final    GRAM POSITIVE COCCI IN CHAINS IN BOTH AEROBIC AND ANAEROBIC BOTTLES CRITICAL VALUE NOTED.  VALUE IS CONSISTENT WITH PREVIOUSLY REPORTED AND CALLED VALUE.    Culture (A)  Final    GROUP B STREP(S.AGALACTIAE)ISOLATED SUSCEPTIBILITIES PERFORMED ON PREVIOUS CULTURE WITHIN THE LAST 5 DAYS. Performed at [facility], [address] [place], [place] [address]    Report Status 04/19/2017 FINAL  Final  Blood Culture (routine x 2)     Status: Abnormal   Collection Time: 04/17/17  6:34 AM  Result Value Ref Range Status   Specimen Description   Final    BLOOD RIGHT ANTECUBITAL Performed at [facility], [address], [place], [place] [address]    Special Requests   Final    BOTTLES DRAWN AEROBIC AND ANAEROBIC Blood Culture adequate volume Performed at [facility], [address] [place], [place] [address]    Culture  Setup Time   Final    GRAM POSITIVE COCCI IN CHAINS IN BOTH AEROBIC AND ANAEROBIC BOTTLES CRITICAL RESULT CALLED TO, READ BACK BY AND VERIFIED WITH: B.GREEN,PHARMD AT 2236 ON 04/17/17 BY G.MCADOO Performed at [facility], [address] [place], [place] [address]    Culture GROUP B STREP(S.AGALACTIAE)ISOLATED (A)  Final   Report Status 04/19/2017 FINAL  Final   Organism ID, Bacteria GROUP B STREP(S.AGALACTIAE)ISOLATED  Final      Susceptibility   Group b strep(s.agalactiae)isolated - MIC*    CLINDAMYCIN >=1 RESISTANT Resistant     AMPICILLIN <=0.25 SENSITIVE Sensitive     ERYTHROMYCIN >=8 RESISTANT Resistant     VANCOMYCIN 0.25 SENSITIVE Sensitive     CEFTRIAXONE <=0.12 SENSITIVE Sensitive     LEVOFLOXACIN 1 SENSITIVE Sensitive     * GROUP B STREP(S.AGALACTIAE)ISOLATED  Blood Culture ID Panel (Reflexed)     Status: Abnormal   Collection Time: 04/17/17  6:34 AM  Result Value Ref Range Status   Enterococcus species NOT DETECTED NOT DETECTED Final   Listeria monocytogenes NOT  DETECTED NOT DETECTED Final   Staphylococcus species NOT DETECTED NOT DETECTED Final   Staphylococcus aureus NOT DETECTED NOT DETECTED Final   Streptococcus species DETECTED (A) NOT DETECTED Final    Comment: CRITICAL RESULT CALLED TO, READ BACK BY AND VERIFIED WITH: B.GREEN,PHARMD AT 2236 ON 04/17/17 BY G.MCADOO    Streptococcus  agalactiae DETECTED (A) NOT DETECTED Final    Comment: CRITICAL RESULT CALLED TO, READ BACK BY AND VERIFIED WITH: B.GREEN,PHARMD AT 2236 ON 04/17/17 BY G.MCADOO    Streptococcus pneumoniae NOT DETECTED NOT DETECTED Final   Streptococcus pyogenes NOT DETECTED NOT DETECTED Final   Acinetobacter baumannii NOT DETECTED NOT DETECTED Final   Enterobacteriaceae species NOT DETECTED NOT DETECTED Final   Enterobacter cloacae complex NOT DETECTED NOT DETECTED Final   Escherichia coli NOT DETECTED NOT DETECTED Final   Klebsiella oxytoca NOT DETECTED NOT DETECTED Final   Klebsiella pneumoniae NOT DETECTED NOT DETECTED Final   Proteus species NOT DETECTED NOT DETECTED Final   Serratia marcescens NOT DETECTED NOT DETECTED Final   Haemophilus influenzae NOT DETECTED NOT DETECTED Final   Neisseria meningitidis NOT DETECTED NOT DETECTED Final   Pseudomonas aeruginosa NOT DETECTED NOT DETECTED Final   Candida albicans NOT DETECTED NOT DETECTED Final   Candida glabrata NOT DETECTED NOT DETECTED Final   Candida krusei NOT DETECTED NOT DETECTED Final   Candida parapsilosis NOT DETECTED NOT DETECTED Final   Candida tropicalis NOT DETECTED NOT DETECTED Final    Comment: Performed at [facility], [address], [place], [place] [address]  Urine culture     Status: Abnormal   Collection Time: 04/17/17  6:49 AM  Result Value Ref Range Status   Specimen Description   Final    URINE, CATHETERIZED Performed at [facility], [address] [place], [place] [address]    Special Requests   Final    NONE Performed at [name], [address] [place], [place] [address]    Culture 80,000 COLONIES/mL ESCHERICHIA COLI (A)  Final   Report Status 04/19/2017 FINAL  Final   Organism ID, Bacteria ESCHERICHIA COLI (A)  Final      Susceptibility   Escherichia coli - MIC*    AMPICILLIN 8 SENSITIVE Sensitive     CEFAZOLIN <=4 SENSITIVE Sensitive     CEFTRIAXONE <=1 SENSITIVE Sensitive     CIPROFLOXACIN <=0.25 SENSITIVE Sensitive     GENTAMICIN <=1 SENSITIVE Sensitive     IMIPENEM <=0.25 SENSITIVE Sensitive     NITROFURANTOIN <=16 SENSITIVE Sensitive     TRIMETH/SULFA <=20 SENSITIVE Sensitive     AMPICILLIN/SULBACTAM 4 SENSITIVE Sensitive     PIP/TAZO <=4 SENSITIVE Sensitive     Extended ESBL NEGATIVE Sensitive     * 80,000 COLONIES/mL ESCHERICHIA COLI  MRSA PCR Screening     Status: None   Collection Time: 04/17/17 12:56 PM  Result Value Ref Range Status   MRSA by PCR NEGATIVE NEGATIVE Final    Comment:        The GeneXpert MRSA Assay (FDA approved for NASAL specimens only), is one component of a comprehensive MRSA colonization surveillance program. It is not intended to diagnose MRSA infection nor to guide or monitor treatment for MRSA infections. Performed at [facility], [address], [place], [place] [address]   Aerobic/Anaerobic Culture (surgical/deep wound)     Status: None (Preliminary result)   Collection Time: 04/17/17  3:04 PM  Result Value Ref Range Status   Specimen Description WOUND  Final   Special Requests   Final    FLUID FROM RIGHT GLUTEAL/ISCHIAL COLLECTION. Performed at [facility], [address], [place], [place] [address]    Gram Stain   Final    MODERATE WBC PRESENT,BOTH PMN AND MONONUCLEAR NO ORGANISMS SEEN    Culture  Final    NO GROWTH 3 DAYS NO ANAEROBES ISOLATED; CULTURE IN PROGRESS FOR 5 DAYS Performed at [facility], [address] [place], [place] [address]    Report Status PENDING  Incomplete  Aerobic/Anaerobic Culture  (surgical/deep wound)     Status: None (Preliminary result)   Collection Time: 04/17/17  3:06 PM  Result Value Ref Range Status   Specimen Description WOUND  Final   Special Requests   Final    FLUID FROM RIGHT TROCHANTERIC HEMATOMA. Performed at [facility], [address], [place], [place] [address]    Gram Stain   Final    MODERATE WBC PRESENT,BOTH PMN AND MONONUCLEAR NO ORGANISMS SEEN    Culture   Final    NO GROWTH 3 DAYS NO ANAEROBES ISOLATED; CULTURE IN PROGRESS FOR 5 DAYS Performed at [facility], [address], [place], [place] [address]    Report Status PENDING  Incomplete  Culture, blood (routine x 2)     Status: None (Preliminary result)   Collection Time: 04/19/17  1:58 PM  Result Value Ref Range Status   Specimen Description   Final    BLOOD RIGHT ANTECUBITAL Performed at [facility], [address], [place], [place] [address]    Special Requests   Final    BOTTLES DRAWN AEROBIC AND ANAEROBIC Blood Culture adequate volume Performed at [facility], [address] [place], [place] [address]    Culture   Final    NO GROWTH < 24 HOURS Performed at [facility], [address] [place], [place] [address]    Report Status PENDING  Incomplete  Culture, blood (routine x 2)     Status: None (Preliminary result)   Collection Time: 04/19/17  2:00 PM  Result Value Ref Range Status   Specimen Description   Final    BLOOD RIGHT HAND Performed at [facility], [address], [place], [place] [address]    Special Requests   Final    BOTTLES DRAWN AEROBIC ONLY Blood Culture adequate volume Performed at [facility], [address] [place], [place] [address]    Culture   Final    NO GROWTH < 24 HOURS Performed at [facility], [address] [place], [place] [address]    Report Status PENDING  Incomplete       Radiology Studies: No results  found.    Scheduled Meds: . aspirin EC  81 mg Oral QPM  . diltiazem  180 mg Oral QHS  . DULoxetine  90 mg Oral Daily  . gabapentin  1,200 mg Oral QHS  . gabapentin  600 mg Oral Q breakfast  . heparin  5,000 Units Subcutaneous Q8H  . insulin aspart  0-15 Units Subcutaneous TID WC  . insulin aspart  0-5 Units Subcutaneous QHS  . metoprolol succinate  100 mg Oral Daily  . pantoprazole  40 mg Oral Daily  . predniSONE  5 mg Oral Q breakfast   Continuous Infusions: . aztreonam Stopped (04/20/17 0559)  . vancomycin Stopped (04/20/17 0915)     LOS: 3 days    Time spent: 40 minutes   Noralee Stain, DO [facility] [email or web address] Password [email or web address] 04/20/2017, 12:47 PM

## 2017-04-20 NOTE — Consult Note (Signed)
[facility]    Date of Admission:  04/17/2017   Total days of antibiotics: 2                Reason for Consult: GBS bacteremia, UTI, PEN allergy    Referring Provider: Maylene Roes   Assessment: GBS bacteremia R trochanteric bursitis UTI PEN allergy.  Decubitus ulcers  Plan: 1. Change anbx to levaquin 2. Good diabetic control (she denies having DM) 3. Nutrition f/u 4. WOC f/u 5. Off loading, air flow mattress  Comment- Her PEN allergy is significant and I am reluctant to challenge her.  Levaquin had good bone levels and is a reasonable choice (with inability to use cephalosporin) in this pt.   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 aspirin EC  81 mg Oral QPM  . diltiazem  180 mg Oral QHS  . DULoxetine  90 mg Oral Daily  . gabapentin  1,200 mg Oral QHS  . gabapentin  600 mg Oral Q breakfast  . heparin  5,000 Units Subcutaneous Q8H  . insulin aspart  0-15 Units Subcutaneous TID WC  . insulin aspart  0-5 Units Subcutaneous QHS  . metoprolol succinate  100 mg Oral Daily  . pantoprazole  40 mg Oral Daily  . predniSONE  5 mg Oral Q breakfast    HPI: Andrea Yu is a 77 y.o. 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  In ED she had temp 103, was found in to be in afib with RVR and had WBC 22.9. Her UA was notable for TNTC WBC. Lactate was 2.56. She was started on vanco/aztreonam (hx  of throat swelling to PEN).  Due to her low back pain, she had CTA (aortic) and was found to have: Two RIGHT pelvic fluid collections are identified: 6.6 x 4.2 x 7.2 cm lateral to and abutting the RIGHT greater trochanter question trochanteric bursitis; and a second collection posteriorly overlying and extending caudal to the RIGHT ischial spine 8.8 x 5.2 x >4.4 cm in size, with overlying skin thickening, subcutaneous infiltration, and potential mild ischial destruction, highly suspicious for osteomyelitis and overlying abscess, potentially related to decubitus ulcer?  Her BCx have been positive for GBS 2/2.  Her UCx grew E coli (pan-sens).  She underwent IR aspirate of her ischial area on 3-24 and had drain placed in what was felt to be a hematoma. (Cx has been negative 3 days).   TTE (3-26):  Normal LV systolic function; mild AI; moderate LAE; mild TR; mild pulmonary hypertension.  She has since be found to have stage 3 R heel and sacral stage 1 decubitus. She states that she has had the heel wound since her previous bone fusion (2017). She has been going to the [facility] wound center.   Drug allergy hx- she took "double dose of PEN prior  to dental procedure" 5 years ago and had throat swelling. Since she has only taken doxy for anbx.   Review of Systems: Review of Systems  Constitutional: Positive for fever.  Respiratory: Negative for cough and shortness of breath.   Gastrointestinal: Negative for abdominal pain, constipation and diarrhea.  Genitourinary: Positive for dysuria.  Musculoskeletal: Positive for back pain.  Neurological: Positive for sensory change.  Please see HPI. All other systems reviewed and negative.   Past Medical History:  Diagnosis Date  . A-fib ([place])    "just after knee 2nd OR" (11/07/2012)  . Anemia   . Arthritis    RA , SACROTIC ARTHRITIS  . Complication of anesthesia    found to be in afib when she woke up  . Dysrhythmia   . GERD (gastroesophageal  reflux disease)   . Head injury, closed, with concussion   . History of blood transfusion    "after 1 of my knee ORs and today" (11/07/2012)  . Hypertension   . Peripheral neuropathy    "from back OR in 2005; in hands and feet" (11/07/2012)  . Psoriatic arthritis ([place])    on Prednisone Rx    Social History   Tobacco Use  . Smoking status: Former Smoker    Packs/day: 0.50    Years: 30.00    Pack years: 15.00    Types: Cigarettes    Last attempt to quit: 08/09/1981    Years since quitting: 35.7  . Smokeless tobacco: Never Used  Substance Use Topics  . Alcohol use: No  . Drug use: No    Family History  Problem Relation Age of Onset  . Heart attack Father        1951-deceased   . Liver disease Mother   . Aneurysm Son 9       Deceased      Medications:  Scheduled: . aspirin EC  81 mg Oral QPM  . diltiazem  180 mg Oral QHS  . DULoxetine  90 mg Oral Daily  . gabapentin  1,200 mg Oral QHS  . gabapentin  600 mg Oral Q breakfast  . heparin  5,000 Units Subcutaneous Q8H  . insulin aspart  0-15 Units Subcutaneous TID WC  . insulin aspart  0-5 Units Subcutaneous QHS  . metoprolol succinate  100 mg Oral Daily  . pantoprazole  40 mg Oral Daily  . predniSONE  5 mg Oral Q breakfast    Abtx:  Anti-infectives (From admission, onward)   Start     Dose/Rate Route Frequency Ordered Stop   04/18/17 0800  vancomycin (VANCOCIN) IVPB 750 mg/150 ml premix     750 mg 150 mL/hr over 60 Minutes Intravenous Every 24 hours 04/17/17 1157     04/17/17 1400  aztreonam (AZACTAM) 2 GM IVPB     2 g 100 mL/hr over 30 Minutes Intravenous Every 8 hours 04/17/17 1157     04/17/17 0630  levofloxacin (LEVAQUIN) IVPB 750 mg     750 mg 100 mL/hr over 90 Minutes Intravenous  Once 04/17/17 0616 04/17/17 0813   04/17/17 0630  aztreonam (AZACTAM) 2 GM IVPB     2 g 100 mL/hr over 30 Minutes Intravenous  Once 04/17/17 0616 04/17/17 0647   04/17/17 0630  vancomycin (VANCOCIN) IVPB 1000 mg/200 mL premix   Status:  Discontinued     1,000 mg 200 mL/hr over 60 Minutes Intravenous  Once 04/17/17 0616 04/17/17 0620   04/17/17 0630  vancomycin (VANCOCIN) 2,000 mg in sodium chloride 0.9 %  500 mL IVPB     2,000 mg 250 mL/hr over 120 Minutes Intravenous  Once 04/17/17 0621 04/17/17 1137        OBJECTIVE: Blood pressure (!) 133/55, pulse 77, temperature 99.2 F (37.3 C), temperature source Oral, resp. rate 12, height _0  (1.6 m), weight 78.9 kg (173 lb 15.1 oz), SpO2 100 %.  Physical Exam  Constitutional: She appears lethargic. She appears to not be writhing in pain. She appears not cachectic.  Non-toxic appearance. She does not have a sickly appearance.  HENT:  Mouth/Throat: No oropharyngeal exudate.  Eyes: Pupils are equal, round, and reactive to light. EOM are normal.  Neck: Neck supple.  Cardiovascular: Normal rate, regular rhythm and normal heart sounds.  Pulmonary/Chest: Effort normal. She has rhonchi in the left upper field.  Abdominal: Soft. Bowel sounds are normal. There is no tenderness. There is no rebound.  Musculoskeletal: She exhibits no edema.  Lymphadenopathy:    She has no cervical adenopathy.  Neurological: She appears lethargic.  Skin:       Lab Results Results for orders placed or performed during the hospital encounter of 04/17/17 (from the past 48 hour(s))  Glucose, capillary     Status: None   Collection Time: 04/18/17  4:37 PM  Result Value Ref Range   Glucose-Capillary 92 65 - 99 mg/dL  Glucose, capillary     Status: None   Collection Time: 04/18/17 10:22 PM  Result Value Ref Range   Glucose-Capillary 65 65 - 99 mg/dL  Glucose, capillary     Status: Abnormal   Collection Time: 04/18/17 11:03 PM  Result Value Ref Range   Glucose-Capillary 115 (H) 65 - 99 mg/dL  Magnesium     Status: None   Collection Time: 04/19/17  3:10 AM  Result Value Ref Range   Magnesium 1.8 1.7 - 2.4 mg/dL    Comment: Performed at [facility], [place]  [address], [place], [place] [address]  Basic metabolic panel     Status: Abnormal   Collection Time: 04/19/17  3:10 AM  Result Value Ref Range   Sodium 139 135 - 145 mmol/L   Potassium 3.8 3.5 - 5.1 mmol/L   Chloride 108 101 - 111 mmol/L   CO2 21 (L) 22 - 32 mmol/L   Glucose, Bld 78 65 - 99 mg/dL   BUN 20 6 - 20 mg/dL   Creatinine, Ser 0.81 0.44 - 1.00 mg/dL   Calcium 8.8 (L) 8.9 - 10.3 mg/dL   GFR calc non Af Amer >60 >60 mL/min   GFR calc Af Amer >60 >60 mL/min    Comment: (NOTE) The eGFR has been calculated using the CKD EPI equation. This calculation has not been validated in all clinical situations. eGFR's persistently <60 mL/min signify possible Chronic Kidney Disease.    Anion gap 10 5 - 15    Comment: Performed at [facility], [place] [address] [place] [address] [place], [place] [address]  Glucose, capillary     Status: None   Collection Time: 04/19/17  7:54 AM  Result Value Ref Range   Glucose-Capillary 67 65 - 99 mg/dL  Glucose, capillary     Status: None   Collection Time: 04/19/17  9:39 AM  Result Value Ref Range   Glucose-Capillary 82 65 - 99 mg/dL  Glucose, capillary     Status: None   Collection Time: 04/19/17 11:53 AM  Result Value Ref Range   Glucose-Capillary 93 65 - 99 mg/dL  Culture, blood (routine x  2)     Status: None (Preliminary result)   Collection Time: 04/19/17  1:58 PM  Result Value Ref Range   Specimen Description      BLOOD RIGHT ANTECUBITAL Performed at [facility] [address], [place], [place] [address]    Special Requests      BOTTLES DRAWN AEROBIC AND ANAEROBIC Blood Culture adequate volume Performed at [facility] [address] [place], [place] [address]    Culture      NO GROWTH < 24 HOURS Performed at [facility] [address] [place], [place] [address]    Report Status PENDING   Culture, blood (routine x 2)     Status: None (Preliminary result)   Collection Time: 04/19/17   2:00 PM  Result Value Ref Range   Specimen Description      BLOOD RIGHT HAND Performed at [facility] [address] [place], [place] [address]    Special Requests      BOTTLES DRAWN AEROBIC ONLY Blood Culture adequate volume Performed at [facility] [address] [place], [place] [address]    Culture      NO GROWTH < 24 HOURS Performed at [facility] [address] [place], [place] [address]    Report Status PENDING   Glucose, capillary     Status: None   Collection Time: 04/19/17  4:28 PM  Result Value Ref Range   Glucose-Capillary 96 65 - 99 mg/dL  Glucose, capillary     Status: None   Collection Time: 04/19/17  9:57 PM  Result Value Ref Range   Glucose-Capillary 81 65 - 99 mg/dL  CBC     Status: Abnormal   Collection Time: 04/20/17  2:57 AM  Result Value Ref Range   WBC 10.3 4.0 - 10.5 K/uL   RBC 3.12 (L) 3.87 - 5.11 MIL/uL   Hemoglobin 8.8 (L) 12.0 - 15.0 g/dL   HCT 27.4 (L) 36.0 - 46.0 %   MCV 87.8 78.0 - 100.0 fL   MCH 28.2 26.0 - 34.0 pg   MCHC 32.1 30.0 - 36.0 g/dL   RDW 15.8 (H) 11.5 - 15.5 %   Platelets 237 150 - 400 K/uL    Comment: Performed at [facility], [place] [address] [place], [place] [address]  Basic metabolic panel     Status: Abnormal   Collection Time: 04/20/17  2:57 AM  Result Value Ref Range   Sodium 137 135 - 145 mmol/L   Potassium 3.5 3.5 - 5.1 mmol/L   Chloride 109 101 - 111 mmol/L   CO2 20 (L) 22 - 32 mmol/L   Glucose, Bld 81 65 - 99 mg/dL   BUN 18 6 - 20 mg/dL   Creatinine, Ser 0.65 0.44 - 1.00 mg/dL   Calcium 8.5 (L) 8.9 - 10.3 mg/dL   GFR calc non Af Amer >60 >60 mL/min   GFR calc Af Amer >60 >60 mL/min    Comment: (NOTE) The eGFR has been calculated using the CKD EPI equation. This calculation has not been validated in all clinical situations. eGFR's persistently <60 mL/min signify possible Chronic Kidney Disease.    Anion gap 8 5 - 15    Comment: Performed  at [facility], [place] [address] [place], [place] [address]  Glucose, capillary     Status: None   Collection Time: 04/20/17  8:07 AM  Result Value Ref Range  Glucose-Capillary 76 65 - 99 mg/dL   Comment 1 Notify RN    Comment 2 Document in Chart       Component Value Date/Time   SDES  04/19/2017 1400    BLOOD RIGHT HAND Performed at [facility], [place] [address] [place], [place] [address]    SPECREQUEST  04/19/2017 1400    BOTTLES DRAWN AEROBIC ONLY Blood Culture adequate volume Performed at [facility] [address], [place], [place] [address]    CULT  04/19/2017 1400    NO GROWTH < 24 HOURS Performed at [facility] [address] [place], [place] [address]    REPTSTATUS PENDING 04/19/2017 1400   No results found. Recent Results (from the past 240 hour(s))  Blood Culture (routine x 2)     Status: Abnormal   Collection Time: 04/17/17  6:34 AM  Result Value Ref Range Status   Specimen Description   Final    BLOOD LEFT ANTECUBITAL Performed at [facility] [address] [place], [place] [address]    Special Requests   Final    BOTTLES DRAWN AEROBIC AND ANAEROBIC Blood Culture adequate volume Performed at [facility] [address], [place], [place] [address]    Culture  Setup Time   Final    GRAM POSITIVE COCCI IN CHAINS IN BOTH AEROBIC AND ANAEROBIC BOTTLES CRITICAL VALUE NOTED.  VALUE IS CONSISTENT WITH PREVIOUSLY REPORTED AND CALLED VALUE.    Culture (A)  Final    GROUP B STREP(S.AGALACTIAE)ISOLATED SUSCEPTIBILITIES PERFORMED ON PREVIOUS CULTURE WITHIN THE LAST 5 DAYS. Performed at [facility], [place] [address], [place], [place] [address]    Report Status 04/19/2017 FINAL  Final  Blood Culture (routine x 2)     Status: Abnormal   Collection Time: 04/17/17  6:34 AM  Result Value Ref Range Status   Specimen Description   Final    BLOOD RIGHT  ANTECUBITAL Performed at [facility] [address] [place], [place] [address]    Special Requests   Final    BOTTLES DRAWN AEROBIC AND ANAEROBIC Blood Culture adequate volume Performed at [facility] [address], [place], [place] [address]    Culture  Setup Time   Final    GRAM POSITIVE COCCI IN CHAINS IN BOTH AEROBIC AND ANAEROBIC BOTTLES CRITICAL RESULT CALLED TO, READ BACK BY AND VERIFIED WITH: B.GREEN,PHARMD AT 2236 ON 04/17/17 BY G.MCADOO Performed at [facility], [place] [address] [place], [place] [address]    Culture GROUP B STREP(S.AGALACTIAE)ISOLATED (A)  Final   Report Status 04/19/2017 FINAL  Final   Organism ID, Bacteria GROUP B STREP(S.AGALACTIAE)ISOLATED  Final      Susceptibility   Group b strep(s.agalactiae)isolated - MIC*    CLINDAMYCIN >=1 RESISTANT Resistant     AMPICILLIN <=0.25 SENSITIVE Sensitive     ERYTHROMYCIN >=8 RESISTANT Resistant     VANCOMYCIN 0.25 SENSITIVE Sensitive     CEFTRIAXONE <=0.12 SENSITIVE Sensitive     LEVOFLOXACIN 1 SENSITIVE Sensitive     * GROUP B STREP(S.AGALACTIAE)ISOLATED  Blood Culture ID Panel (Reflexed)     Status: Abnormal   Collection Time: 04/17/17  6:34 AM  Result Value Ref Range Status   Enterococcus species NOT DETECTED NOT DETECTED Final   Listeria monocytogenes NOT DETECTED NOT DETECTED Final   Staphylococcus species NOT DETECTED NOT DETECTED Final   Staphylococcus aureus NOT DETECTED NOT DETECTED Final   Streptococcus  species DETECTED (A) NOT DETECTED Final    Comment: CRITICAL RESULT CALLED TO, READ BACK BY AND VERIFIED WITH: B.GREEN,PHARMD AT 2236 ON 04/17/17 BY G.MCADOO    Streptococcus agalactiae DETECTED (A) NOT DETECTED Final    Comment: CRITICAL RESULT CALLED TO, READ BACK BY AND VERIFIED WITH: B.GREEN,PHARMD AT 2236 ON 04/17/17 BY G.MCADOO    Streptococcus pneumoniae NOT DETECTED NOT DETECTED Final   Streptococcus pyogenes NOT DETECTED NOT DETECTED Final    Acinetobacter baumannii NOT DETECTED NOT DETECTED Final   Enterobacteriaceae species NOT DETECTED NOT DETECTED Final   Enterobacter cloacae complex NOT DETECTED NOT DETECTED Final   Escherichia coli NOT DETECTED NOT DETECTED Final   Klebsiella oxytoca NOT DETECTED NOT DETECTED Final   Klebsiella pneumoniae NOT DETECTED NOT DETECTED Final   Proteus species NOT DETECTED NOT DETECTED Final   Serratia marcescens NOT DETECTED NOT DETECTED Final   Haemophilus influenzae NOT DETECTED NOT DETECTED Final   Neisseria meningitidis NOT DETECTED NOT DETECTED Final   Pseudomonas aeruginosa NOT DETECTED NOT DETECTED Final   Candida albicans NOT DETECTED NOT DETECTED Final   Candida glabrata NOT DETECTED NOT DETECTED Final   Candida krusei NOT DETECTED NOT DETECTED Final   Candida parapsilosis NOT DETECTED NOT DETECTED Final   Candida tropicalis NOT DETECTED NOT DETECTED Final    Comment: Performed at [facility], [place] [address] [place], [place] [address]  Urine culture     Status: Abnormal   Collection Time: 04/17/17  6:49 AM  Result Value Ref Range Status   Specimen Description   Final    URINE, CATHETERIZED Performed at [facility] [address] [place], [place] [address]    Special Requests   Final    NONE Performed at [facility], [place] [address], [place], [place] [address]    Culture 80,000 COLONIES/mL ESCHERICHIA COLI (A)  Final   Report Status 04/19/2017 FINAL  Final   Organism ID, Bacteria ESCHERICHIA COLI (A)  Final      Susceptibility   Escherichia coli - MIC*    AMPICILLIN 8 SENSITIVE Sensitive     CEFAZOLIN <=4 SENSITIVE Sensitive     CEFTRIAXONE <=1 SENSITIVE Sensitive     CIPROFLOXACIN <=0.25 SENSITIVE Sensitive     GENTAMICIN <=1 SENSITIVE Sensitive     IMIPENEM <=0.25 SENSITIVE Sensitive     NITROFURANTOIN <=16 SENSITIVE Sensitive     TRIMETH/SULFA <=20 SENSITIVE Sensitive     AMPICILLIN/SULBACTAM 4 SENSITIVE Sensitive      PIP/TAZO <=4 SENSITIVE Sensitive     Extended ESBL NEGATIVE Sensitive     * 80,000 COLONIES/mL ESCHERICHIA COLI  MRSA PCR Screening     Status: None   Collection Time: 04/17/17 12:56 PM  Result Value Ref Range Status   MRSA by PCR NEGATIVE NEGATIVE Final    Comment:        The GeneXpert MRSA Assay (FDA approved for NASAL specimens only), is one component of a comprehensive MRSA colonization surveillance program. It is not intended to diagnose MRSA infection nor to guide or monitor treatment for MRSA infections. Performed at [facility], [place] [address], [place], [place] [address]   Aerobic/Anaerobic Culture (surgical/deep wound)     Status: None (Preliminary result)   Collection Time: 04/17/17  3:04 PM  Result Value Ref Range Status   Specimen Description WOUND  Final   Special Requests   Final    FLUID FROM RIGHT GLUTEAL/ISCHIAL COLLECTION. Performed at [facility], [address] [place], [place] [address]    Gram Stain   Final    MODERATE WBC PRESENT,BOTH PMN AND MONONUCLEAR NO ORGANISMS SEEN    Culture   Final    NO GROWTH 3 DAYS NO ANAEROBES ISOLATED; CULTURE IN PROGRESS FOR 5 DAYS Performed at [facility], [place] [address], [place], [place] [address]    Report Status PENDING  Incomplete  Aerobic/Anaerobic Culture (surgical/deep wound)     Status: None (Preliminary result)   Collection Time: 04/17/17  3:06 PM  Result Value Ref Range Status   Specimen Description WOUND  Final   Special Requests   Final    FLUID FROM RIGHT TROCHANTERIC HEMATOMA. Performed at [facility], [place] [address], [place], [place] [address]    Gram Stain   Final    MODERATE WBC PRESENT,BOTH PMN AND MONONUCLEAR NO ORGANISMS SEEN    Culture   Final    NO GROWTH 3 DAYS NO ANAEROBES ISOLATED; CULTURE IN PROGRESS FOR 5 DAYS Performed at [facility], [place] [address] [place], [place] [address]    Report Status PENDING   Incomplete  Culture, blood (routine x 2)     Status: None (Preliminary result)   Collection Time: 04/19/17  1:58 PM  Result Value Ref Range Status   Specimen Description   Final    BLOOD RIGHT ANTECUBITAL Performed at [facility] [address] [place], [place] [address]    Special Requests   Final    BOTTLES DRAWN AEROBIC AND ANAEROBIC Blood Culture adequate volume Performed at [facility] [address] [place], [place] [address]    Culture   Final    NO GROWTH < 24 HOURS Performed at [facility] [address],  [place], [place] [address]    Report Status PENDING  Incomplete  Culture, blood (routine x 2)     Status: None (Preliminary result)   Collection Time: 04/19/17  2:00 PM  Result Value Ref Range Status   Specimen Description   Final    BLOOD RIGHT HAND Performed at [facility] [address] [place], [place] [address]    Special Requests   Final    BOTTLES DRAWN AEROBIC ONLY Blood Culture adequate volume Performed at [facility] [address] [place], [place] [address]    Culture   Final    NO GROWTH < 24 HOURS Performed at [facility] [address] [place], [place] [address]    Report Status PENDING  Incomplete    Microbiology: Recent Results (from the past 240 hour(s))  Blood Culture (routine x 2)     Status: Abnormal   Collection Time: 04/17/17  6:34 AM  Result Value Ref Range Status   Specimen Description   Final    BLOOD LEFT ANTECUBITAL Performed at [facility] [address], [place], [place] [address]    Special Requests   Final    BOTTLES DRAWN AEROBIC AND ANAEROBIC Blood Culture adequate volume Performed at [facility] [address] [place], [place] [address]    Culture  Setup Time   Final    GRAM POSITIVE COCCI IN CHAINS IN BOTH AEROBIC AND ANAEROBIC BOTTLES CRITICAL VALUE NOTED.  VALUE IS  CONSISTENT WITH PREVIOUSLY REPORTED AND CALLED VALUE.    Culture (A)  Final    GROUP B STREP(S.AGALACTIAE)ISOLATED SUSCEPTIBILITIES PERFORMED ON PREVIOUS CULTURE WITHIN THE LAST 5 DAYS. Performed  at [facility], [place] [address], [place], [place] [address]    Report Status 04/19/2017 FINAL  Final  Blood Culture (routine x 2)     Status: Abnormal   Collection Time: 04/17/17  6:34 AM  Result Value Ref Range Status   Specimen Description   Final    BLOOD RIGHT ANTECUBITAL Performed at [facility] [address] [place], [place] [address]    Special Requests   Final    BOTTLES DRAWN AEROBIC AND ANAEROBIC Blood Culture adequate volume Performed at [facility] [address], [place], [place] [address]    Culture  Setup Time   Final    GRAM POSITIVE COCCI IN CHAINS IN BOTH AEROBIC AND ANAEROBIC BOTTLES CRITICAL RESULT CALLED TO, READ BACK BY AND VERIFIED WITH: B.GREEN,PHARMD AT 2236 ON 04/17/17 BY G.MCADOO Performed at [facility], [place] [address] [place], [place] [address]    Culture GROUP B STREP(S.AGALACTIAE)ISOLATED (A)  Final   Report Status 04/19/2017 FINAL  Final   Organism ID, Bacteria GROUP B STREP(S.AGALACTIAE)ISOLATED  Final      Susceptibility   Group b strep(s.agalactiae)isolated - MIC*    CLINDAMYCIN >=1 RESISTANT Resistant     AMPICILLIN <=0.25 SENSITIVE Sensitive     ERYTHROMYCIN >=8 RESISTANT Resistant     VANCOMYCIN 0.25 SENSITIVE Sensitive     CEFTRIAXONE <=0.12 SENSITIVE Sensitive     LEVOFLOXACIN 1 SENSITIVE Sensitive     * GROUP B STREP(S.AGALACTIAE)ISOLATED  Blood Culture ID Panel (Reflexed)     Status: Abnormal   Collection Time: 04/17/17  6:34 AM  Result Value Ref Range Status   Enterococcus species NOT DETECTED NOT DETECTED Final   Listeria monocytogenes NOT DETECTED NOT DETECTED Final   Staphylococcus species NOT DETECTED NOT DETECTED Final   Staphylococcus aureus NOT DETECTED NOT DETECTED Final    Streptococcus species DETECTED (A) NOT DETECTED Final    Comment: CRITICAL RESULT CALLED TO, READ BACK BY AND VERIFIED WITH: B.GREEN,PHARMD AT 2236 ON 04/17/17 BY G.MCADOO    Streptococcus agalactiae DETECTED (A) NOT DETECTED Final    Comment: CRITICAL RESULT CALLED TO, READ BACK BY AND VERIFIED WITH: B.GREEN,PHARMD AT 2236 ON 04/17/17 BY G.MCADOO    Streptococcus pneumoniae NOT DETECTED NOT DETECTED Final   Streptococcus pyogenes NOT DETECTED NOT DETECTED Final   Acinetobacter baumannii NOT DETECTED NOT DETECTED Final   Enterobacteriaceae species NOT DETECTED NOT DETECTED Final   Enterobacter cloacae complex NOT DETECTED NOT DETECTED Final   Escherichia coli NOT DETECTED NOT DETECTED Final   Klebsiella oxytoca NOT DETECTED NOT DETECTED Final   Klebsiella pneumoniae NOT DETECTED NOT DETECTED Final   Proteus species NOT DETECTED NOT DETECTED Final   Serratia marcescens NOT DETECTED NOT DETECTED Final   Haemophilus influenzae NOT DETECTED NOT DETECTED Final   Neisseria meningitidis NOT DETECTED NOT DETECTED Final   Pseudomonas aeruginosa NOT DETECTED NOT DETECTED Final   Candida albicans NOT DETECTED NOT DETECTED Final   Candida glabrata NOT DETECTED NOT DETECTED Final   Candida krusei NOT DETECTED NOT DETECTED Final   Candida parapsilosis NOT DETECTED NOT DETECTED Final   Candida tropicalis NOT DETECTED NOT DETECTED Final    Comment: Performed at [facility], [place] [address] [place], [place] [address]  Urine culture     Status: Abnormal   Collection Time: 04/17/17  6:49 AM  Result Value Ref Range Status   Specimen Description   Final    URINE, CATHETERIZED Performed at [facility], [place] [address] [place], [place] [address]    Special Requests   Final    NONE Performed at [facility], [place] [address], [place], [place] [address]    Culture 80,000 COLONIES/mL ESCHERICHIA COLI (A)  Final   Report Status 04/19/2017 FINAL  Final   Organism  ID, Bacteria ESCHERICHIA COLI (A)  Final      Susceptibility   Escherichia coli - MIC*    AMPICILLIN 8 SENSITIVE Sensitive     CEFAZOLIN <=4 SENSITIVE Sensitive     CEFTRIAXONE <=1 SENSITIVE Sensitive     CIPROFLOXACIN <=0.25 SENSITIVE Sensitive     GENTAMICIN <=1 SENSITIVE Sensitive     IMIPENEM <=0.25 SENSITIVE Sensitive     NITROFURANTOIN <=16 SENSITIVE Sensitive     TRIMETH/SULFA <=20 SENSITIVE Sensitive     AMPICILLIN/SULBACTAM 4 SENSITIVE Sensitive     PIP/TAZO <=4 SENSITIVE Sensitive     Extended ESBL NEGATIVE Sensitive     * 80,000 COLONIES/mL ESCHERICHIA COLI  MRSA PCR Screening     Status: None   Collection Time: 04/17/17 12:56 PM  Result Value Ref Range Status   MRSA by PCR NEGATIVE NEGATIVE Final    Comment:        The GeneXpert MRSA Assay (FDA approved for NASAL specimens only), is one component of a comprehensive MRSA colonization surveillance program. It is not intended to diagnose MRSA infection nor to guide or monitor treatment for MRSA infections. Performed at [facility], [place] [address], [place], [place] [address]   Aerobic/Anaerobic Culture (surgical/deep wound)     Status: None (Preliminary result)   Collection Time: 04/17/17  3:04 PM  Result Value Ref Range Status   Specimen Description WOUND  Final   Special Requests   Final    FLUID FROM RIGHT GLUTEAL/ISCHIAL COLLECTION. Performed at [facility], [place] [address], [place], [place] [address]    Gram Stain   Final    MODERATE WBC PRESENT,BOTH PMN AND MONONUCLEAR NO ORGANISMS SEEN    Culture   Final    NO GROWTH 3 DAYS NO ANAEROBES ISOLATED; CULTURE IN PROGRESS FOR 5 DAYS Performed at [facility], [place] [address], [place], [place] [address]    Report Status PENDING  Incomplete  Aerobic/Anaerobic Culture (surgical/deep wound)     Status: None (Preliminary result)   Collection Time: 04/17/17  3:06 PM  Result Value Ref Range Status   Specimen Description  WOUND  Final   Special Requests   Final    FLUID FROM RIGHT TROCHANTERIC HEMATOMA. Performed at [facility], [place] [address], [place], [place] [address]    Gram Stain   Final    MODERATE WBC PRESENT,BOTH PMN AND MONONUCLEAR NO ORGANISMS SEEN    Culture   Final    NO GROWTH 3 DAYS NO ANAEROBES ISOLATED; CULTURE IN PROGRESS FOR 5 DAYS Performed at [facility], [place] [address] [place], [place] [address]    Report Status PENDING  Incomplete  Culture, blood (routine x 2)     Status: None (Preliminary result)   Collection Time: 04/19/17  1:58 PM  Result Value Ref Range Status   Specimen Description   Final    BLOOD RIGHT ANTECUBITAL Performed at [place] [address] [place],  [address]    Special Requests   Final    BOTTLES DRAWN AEROBIC AND ANAEROBIC Blood Culture adequate volume Performed at [place]  [address], [place], [place] [address]    Culture   Final    NO GROWTH < 24 HOURS Performed at [place] [address], [place], [place] [address]    Report Status PENDING  Incomplete  Culture, blood (routine x 2)     Status: None (Preliminary result)   Collection Time: 04/19/17  2:00 PM  Result Value Ref Range Status   Specimen Description   Final    BLOOD RIGHT HAND Performed at [place] [address], [place], [place] [address]    Special Requests   Final    BOTTLES DRAWN AEROBIC ONLY Blood Culture adequate volume Performed at [place] [address] [place], [place] [address]    Culture   Final    NO GROWTH < 24 HOURS Performed at [place] [address], [place], [place] [address]    Report Status PENDING  Incomplete    Radiographs and labs were personally reviewed by me.   Bobby Rumpf, MD [facility] [phone] 04/20/2017, 2:04 PM

## 2017-04-20 NOTE — Progress Notes (Signed)
Central tele called RN and informed RN that pt HR had gone down to the low 50s and would come back up to 60s and that pt also had a few pauses ranging from 2.1 seconds to 2.6 seconds. RN texted paged MD and inform her of this incident.

## 2017-04-21 DIAGNOSIS — M869 Osteomyelitis, unspecified: Secondary | ICD-10-CM

## 2017-04-21 LAB — GLUCOSE, CAPILLARY
GLUCOSE-CAPILLARY: 77 mg/dL (ref 65–99)
GLUCOSE-CAPILLARY: 107 mg/dL — AB (ref 65–99)
GLUCOSE-CAPILLARY: 115 mg/dL — AB (ref 65–99)
Glucose-Capillary: 114 mg/dL — ABNORMAL HIGH (ref 65–99)

## 2017-04-21 LAB — BASIC METABOLIC PANEL
Anion gap: 8 (ref 5–15)
BUN: 13 mg/dL (ref 6–20)
CHLORIDE: 109 mmol/L (ref 101–111)
CO2: 22 mmol/L (ref 22–32)
CREATININE: 0.52 mg/dL (ref 0.44–1.00)
Calcium: 8.6 mg/dL — ABNORMAL LOW (ref 8.9–10.3)
Glucose, Bld: 83 mg/dL (ref 65–99)
POTASSIUM: 3.3 mmol/L — AB (ref 3.5–5.1)
SODIUM: 139 mmol/L (ref 135–145)

## 2017-04-21 LAB — CBC
HCT: 29 % — ABNORMAL LOW (ref 36.0–46.0)
HEMOGLOBIN: 9.4 g/dL — AB (ref 12.0–15.0)
MCH: 28.1 pg (ref 26.0–34.0)
MCHC: 32.4 g/dL (ref 30.0–36.0)
MCV: 86.8 fL (ref 78.0–100.0)
PLATELETS: 293 10*3/uL (ref 150–400)
RBC: 3.34 MIL/uL — AB (ref 3.87–5.11)
RDW: 15.5 % (ref 11.5–15.5)
WBC: 10.7 10*3/uL — ABNORMAL HIGH (ref 4.0–10.5)

## 2017-04-21 MED ORDER — POTASSIUM CHLORIDE CRYS ER 20 MEQ PO TBCR
40.0000 meq | EXTENDED_RELEASE_TABLET | Freq: Once | ORAL | Status: AC
  Administered 2017-04-21: 40 meq via ORAL
  Filled 2017-04-21: qty 2

## 2017-04-21 NOTE — Progress Notes (Signed)
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 S/p drainage by IR on 04/17/2017, findings compatible with hematomas, drain removed  -Fluid cultures negative to date  -Blood culture 3/24 with group B strep  -Repeat blood culture 3/26 negative to date  -ID consulted; due to anaphylactic reaction to PCN, patient is now on levaquin   Acute encephalopathy -Appears to have resolved  Atrial fibrillation with rapid ventricular response -Rate well controlled today  -Continue  metoprolol - dose decreased due to bradycardic episodes yesterday, diltiazem, aspirin  -Patient is not a candidate for anticoagulation due to prior intracranial hemorrhages and falls  ?UTI vs asymptomatic bacteruria  -Urine culture with E Coli  -Continue levaquin   Psoriatic arthritis -Continue home prednisone 5 mg, continue PPI  -Hold home apremilast 30mg  BID due to sepsis  Chronic pain -Continue duloxetine, norco, gabapentin, robaxin   Hypertension -Hold home losartan, furosemide due to marginal BP   DM -SSI   Osteoporosis -Hold home teriparatide -Hold home vitamin D supplementation  Hypokalemia -Replace, trend    DVT prophylaxis: Subq hep Code Status: Full Family Communication: No family at bedside Disposition Plan: Pending PT evaluation, improvement clinically.  Hopeful discharge next 24-48 hours    Consultants:   Orthopedic surgery  IR  ID   Procedures:   04/17/2017 drainage of right trochanteric/ischial tuberosity hematoma   Antimicrobials:  Anti-infectives (From admission, onward)   Start     Dose/Rate Route Frequency Ordered Stop   04/20/17 1600  levofloxacin (LEVAQUIN) IVPB 750 mg     750 mg 100 mL/hr over 90 Minutes Intravenous Every 24 hours 04/20/17 1448     04/18/17 0800  vancomycin (VANCOCIN) IVPB 750 mg/150 ml premix  Status:  Discontinued     750 mg 150 mL/hr over 60 Minutes Intravenous Every 24 hours 04/17/17 1157 04/20/17 1448   04/17/17 1400  aztreonam (AZACTAM) 2 GM IVPB  Status:  Discontinued     2 g 100 mL/hr over 30 Minutes Intravenous Every 8 hours 04/17/17 1157 04/20/17 1448   04/17/17 0630  levofloxacin (LEVAQUIN) IVPB 750 mg     750 mg 100 mL/hr over 90 Minutes Intravenous  Once 04/17/17  7062 04/17/17 0813   04/17/17 0630  aztreonam (AZACTAM) 2 GM IVPB     2 g 100 mL/hr over 30 Minutes Intravenous  Once 04/17/17 0616 04/17/17 0647   04/17/17 0630  vancomycin (VANCOCIN) IVPB 1000 mg/200 mL premix  Status:  Discontinued      1,000 mg 200 mL/hr over 60 Minutes Intravenous  Once 04/17/17 0616 04/17/17 0620   04/17/17 0630  vancomycin (VANCOCIN) 2,000 mg in sodium chloride 0.9 % 500 mL IVPB     2,000 mg 250 mL/hr over 120 Minutes Intravenous  Once 04/17/17 0621 04/17/17 1137       Subjective: Complaining of low back spasms and overall discomfort.  Declined to work with physical therapy yesterday.  Objective: Vitals:   04/21/17 0400 04/21/17 0500 04/21/17 0600 04/21/17 0800  BP: (!) 127/56 (!) 136/56 (!) 133/54 (!) 126/58  Pulse: 76 87 92 83  Resp: 14 14 13 13   Temp:    98.2 F (36.8 C)  TempSrc:    Oral  SpO2: 99% 99% 98% 100%  Weight:  79.4 kg (175 lb 0.7 oz)    Height:        Intake/Output Summary (Last 24 hours) at 04/21/2017 1247 Last data filed at 04/21/2017 0554 Gross per 24 hour  Intake 150 ml  Output 750 ml  Net -600 ml   Filed Weights   04/19/17 0500 04/20/17 0500 04/21/17 0500  Weight: 76.8 kg (169 lb 5 oz) 78.9 kg (173 lb 15.1 oz) 79.4 kg (175 lb 0.7 oz)   Examination: General exam: Appears calm and comfortable  Respiratory system: Clear to auscultation. Respiratory effort normal. Cardiovascular system: S1 & S2 heard, irreg rhythm. No JVD, murmurs, rubs, gallops or clicks. No pedal edema. Gastrointestinal system: Abdomen is nondistended, soft and nontender. No organomegaly or masses felt. Normal bowel sounds heard. Central nervous system: Alert and oriented. No focal neurological deficits. Extremities: Symmetric 5 x 5 power. Skin: No rashes, lesions or ulcers Psychiatry: Judgement and insight appear normal. Mood & affect appropriate.    Data Reviewed: I have personally reviewed following labs and imaging studies  CBC: Recent Labs  Lab 04/17/17 0634 04/18/17 0341 04/20/17 0257 04/21/17 0332  WBC 22.9* 15.7* 10.3 10.7*  NEUTROABS 21.4*  --   --   --   HGB 10.5* 8.7* 8.8* 9.4*  HCT 31.4* 27.0* 27.4* 29.0*  MCV 86.5 89.1 87.8 86.8  PLT 314 288 237 293   Basic Metabolic  Panel: Recent Labs  Lab 04/17/17 0634 04/18/17 0341 04/19/17 0310 04/20/17 0257 04/21/17 0332  NA 137 140 139 137 139  K 3.2* 3.7 3.8 3.5 3.3*  CL 101 110 108 109 109  CO2 22 21* 21* 20* 22  GLUCOSE 129* 81 78 81 83  BUN 12 15 20 18 13   CREATININE 1.00 0.78 0.81 0.65 0.52  CALCIUM 8.9 8.6* 8.8* 8.5* 8.6*  MG  --  1.5* 1.8  --   --    GFR: Estimated Creatinine Clearance: 57.8 mL/min (by C-G formula based on SCr of 0.52 mg/dL). Liver Function Tests: Recent Labs  Lab 04/17/17 0634 04/18/17 0341  AST 21 16  ALT 10* 11*  ALKPHOS 119 91  BILITOT 0.9 0.6  PROT 6.5 5.3*  ALBUMIN 2.8* 2.2*   No results for input(s): LIPASE, AMYLASE in the last 168 hours. No results for input(s): AMMONIA in the last 168 hours. Coagulation Profile: Recent Labs  Lab 04/18/17 0341  INR 1.21   Cardiac Enzymes: No results for input(s):  CKTOTAL, CKMB, CKMBINDEX, TROPONINI in the last 168 hours. BNP (last 3 results) No results for input(s): PROBNP in the last 8760 hours. HbA1C: No results for input(s): HGBA1C in the last 72 hours. CBG: Recent Labs  Lab 04/20/17 1128 04/20/17 1642 04/20/17 2107 04/21/17 0843 04/21/17 1148  GLUCAP 86 92 74 77 107*   Lipid Profile: No results for input(s): CHOL, HDL, LDLCALC, TRIG, CHOLHDL, LDLDIRECT in the last 72 hours. Thyroid Function Tests: No results for input(s): TSH, T4TOTAL, FREET4, T3FREE, THYROIDAB in the last 72 hours. Anemia Panel: No results for input(s): VITAMINB12, FOLATE, FERRITIN, TIBC, IRON, RETICCTPCT in the last 72 hours. Sepsis Labs: Recent Labs  Lab 04/17/17 4401 04/17/17 0853  LATICACIDVEN 2.56* 1.33    Recent Results (from the past 240 hour(s))  Blood Culture (routine x 2)     Status: Abnormal   Collection Time: 04/17/17  6:34 AM  Result Value Ref Range Status   Specimen Description   Final    BLOOD LEFT ANTECUBITAL Performed at [facility], [address], [place], [place] [address]    Special  Requests   Final    BOTTLES DRAWN AEROBIC AND ANAEROBIC Blood Culture adequate volume Performed at [facility], [address], [place], [place] [address]    Culture  Setup Time   Final    GRAM POSITIVE COCCI IN CHAINS IN BOTH AEROBIC AND ANAEROBIC BOTTLES CRITICAL VALUE NOTED.  VALUE IS CONSISTENT WITH PREVIOUSLY REPORTED AND CALLED VALUE.    Culture (A)  Final    GROUP B STREP(S.AGALACTIAE)ISOLATED SUSCEPTIBILITIES PERFORMED ON PREVIOUS CULTURE WITHIN THE LAST 5 DAYS. Performed at [facility], [address] [place], [place] [address]    Report Status 04/19/2017 FINAL  Final  Blood Culture (routine x 2)     Status: Abnormal   Collection Time: 04/17/17  6:34 AM  Result Value Ref Range Status   Specimen Description   Final    BLOOD RIGHT ANTECUBITAL Performed at [facility], [address] [place], [place] [address]    Special Requests   Final    BOTTLES DRAWN AEROBIC AND ANAEROBIC Blood Culture adequate volume Performed at [facility], [address] [place], [place] [address]    Culture  Setup Time   Final    GRAM POSITIVE COCCI IN CHAINS IN BOTH AEROBIC AND ANAEROBIC BOTTLES CRITICAL RESULT CALLED TO, READ BACK BY AND VERIFIED WITH: B.GREEN,PHARMD AT 2236 ON 04/17/17 BY G.MCADOO Performed at [facility], [address], [place], [place] [address]    Culture GROUP B STREP(S.AGALACTIAE)ISOLATED (A)  Final   Report Status 04/19/2017 FINAL  Final   Organism ID, Bacteria GROUP B STREP(S.AGALACTIAE)ISOLATED  Final      Susceptibility   Group b strep(s.agalactiae)isolated - MIC*    CLINDAMYCIN >=1 RESISTANT Resistant     AMPICILLIN <=0.25 SENSITIVE Sensitive     ERYTHROMYCIN >=8 RESISTANT Resistant     VANCOMYCIN 0.25 SENSITIVE Sensitive     CEFTRIAXONE <=0.12 SENSITIVE Sensitive     LEVOFLOXACIN 1 SENSITIVE Sensitive     * GROUP B STREP(S.AGALACTIAE)ISOLATED  Blood Culture ID Panel (Reflexed)      Status: Abnormal   Collection Time: 04/17/17  6:34 AM  Result Value Ref Range Status   Enterococcus species NOT DETECTED NOT DETECTED Final   Listeria monocytogenes NOT DETECTED NOT DETECTED Final   Staphylococcus species NOT DETECTED NOT DETECTED Final   Staphylococcus aureus NOT DETECTED NOT DETECTED Final  Streptococcus species DETECTED (A) NOT DETECTED Final    Comment: CRITICAL RESULT CALLED TO, READ BACK BY AND VERIFIED WITH: B.GREEN,PHARMD AT 2236 ON 04/17/17 BY G.MCADOO    Streptococcus agalactiae DETECTED (A) NOT DETECTED Final    Comment: CRITICAL RESULT CALLED TO, READ BACK BY AND VERIFIED WITH: B.GREEN,PHARMD AT 2236 ON 04/17/17 BY G.MCADOO    Streptococcus pneumoniae NOT DETECTED NOT DETECTED Final   Streptococcus pyogenes NOT DETECTED NOT DETECTED Final   Acinetobacter baumannii NOT DETECTED NOT DETECTED Final   Enterobacteriaceae species NOT DETECTED NOT DETECTED Final   Enterobacter cloacae complex NOT DETECTED NOT DETECTED Final   Escherichia coli NOT DETECTED NOT DETECTED Final   Klebsiella oxytoca NOT DETECTED NOT DETECTED Final   Klebsiella pneumoniae NOT DETECTED NOT DETECTED Final   Proteus species NOT DETECTED NOT DETECTED Final   Serratia marcescens NOT DETECTED NOT DETECTED Final   Haemophilus influenzae NOT DETECTED NOT DETECTED Final   Neisseria meningitidis NOT DETECTED NOT DETECTED Final   Pseudomonas aeruginosa NOT DETECTED NOT DETECTED Final   Candida albicans NOT DETECTED NOT DETECTED Final   Candida glabrata NOT DETECTED NOT DETECTED Final   Candida krusei NOT DETECTED NOT DETECTED Final   Candida parapsilosis NOT DETECTED NOT DETECTED Final   Candida tropicalis NOT DETECTED NOT DETECTED Final    Comment: Performed at [facility], [address], [place], [place] [address]  Urine culture     Status: Abnormal   Collection Time: 04/17/17  6:49 AM  Result Value Ref Range Status   Specimen Description   Final    URINE, CATHETERIZED Performed  at [facility], [address], [place], [place] [address]    Special Requests   Final    NONE Performed at [facility], [address] [place] [address] [place], [place] [address]    Culture 80,000 COLONIES/mL ESCHERICHIA COLI (A)  Final   Report Status 04/19/2017 FINAL  Final   Organism ID, Bacteria ESCHERICHIA COLI (A)  Final      Susceptibility   Escherichia coli - MIC*    AMPICILLIN 8 SENSITIVE Sensitive     CEFAZOLIN <=4 SENSITIVE Sensitive     CEFTRIAXONE <=1 SENSITIVE Sensitive     CIPROFLOXACIN <=0.25 SENSITIVE Sensitive     GENTAMICIN <=1 SENSITIVE Sensitive     IMIPENEM <=0.25 SENSITIVE Sensitive     NITROFURANTOIN <=16 SENSITIVE Sensitive     TRIMETH/SULFA <=20 SENSITIVE Sensitive     AMPICILLIN/SULBACTAM 4 SENSITIVE Sensitive     PIP/TAZO <=4 SENSITIVE Sensitive     Extended ESBL NEGATIVE Sensitive     * 80,000 COLONIES/mL ESCHERICHIA COLI  MRSA PCR Screening     Status: None   Collection Time: 04/17/17 12:56 PM  Result Value Ref Range Status   MRSA by PCR NEGATIVE NEGATIVE Final    Comment:        The GeneXpert MRSA Assay (FDA approved for NASAL specimens only), is one component of a comprehensive MRSA colonization surveillance program. It is not intended to diagnose MRSA infection nor to guide or monitor treatment for MRSA infections. Performed at [facility], [address] [place], [place] [address]   Aerobic/Anaerobic Culture (surgical/deep wound)     Status: None (Preliminary result)   Collection Time: 04/17/17  3:04 PM  Result Value Ref Range Status   Specimen Description WOUND  Final   Special Requests   Final    FLUID FROM RIGHT GLUTEAL/ISCHIAL COLLECTION. Performed at [facility], [address] [place], [place] [address]    Gram Stain   Final    MODERATE WBC PRESENT,BOTH PMN AND MONONUCLEAR NO ORGANISMS SEEN    Culture   Final    NO GROWTH 4 DAYS NO ANAEROBES ISOLATED; CULTURE  IN PROGRESS FOR 5 DAYS Performed at [facility], [address] [place], [place] [address]    Report Status PENDING  Incomplete  Aerobic/Anaerobic Culture (surgical/deep wound)     Status: None (Preliminary result)   Collection Time: 04/17/17  3:06 PM  Result Value Ref Range Status   Specimen Description WOUND  Final   Special Requests   Final    FLUID FROM RIGHT TROCHANTERIC HEMATOMA. Performed at [facility], [address], [place], [place] [address]    Gram Stain   Final    MODERATE WBC PRESENT,BOTH PMN AND MONONUCLEAR NO ORGANISMS SEEN    Culture   Final    NO GROWTH 4 DAYS NO ANAEROBES ISOLATED; CULTURE IN PROGRESS FOR 5 DAYS Performed at [facility], [address] [place], [place] [address]    Report Status PENDING  Incomplete  Culture, blood (routine x 2)     Status: None (Preliminary result)   Collection Time: 04/19/17  1:58 PM  Result Value Ref Range Status   Specimen Description   Final    BLOOD RIGHT ANTECUBITAL Performed at [facility], [address], [place], [place] [address]    Special Requests   Final    BOTTLES DRAWN AEROBIC AND ANAEROBIC Blood Culture adequate volume Performed at [facility], [address] [place], [place] [address]    Culture   Final    NO GROWTH 2 DAYS Performed at [facility], [address], [place], [place] [address]    Report Status PENDING  Incomplete  Culture, blood (routine x 2)     Status: None (Preliminary result)   Collection Time: 04/19/17  2:00 PM  Result Value Ref Range Status   Specimen Description   Final    BLOOD RIGHT HAND Performed at [facility], [address] [place], [place] [address]    Special Requests   Final    BOTTLES DRAWN AEROBIC ONLY Blood Culture adequate volume Performed at [facility], [address], [place], [place] [address]    Culture   Final    NO GROWTH 2  DAYS Performed at [facility], [address] [place], [place] [address]    Report Status PENDING  Incomplete       Radiology Studies: No results found.    Scheduled Meds: . aspirin EC  81 mg Oral QPM  . diltiazem  180 mg Oral QHS  . DULoxetine  90 mg Oral Daily  . gabapentin  1,200 mg Oral QHS  . gabapentin  600 mg Oral Q breakfast  . heparin  5,000 Units Subcutaneous Q8H  . insulin aspart  0-15 Units Subcutaneous TID WC  . insulin aspart  0-5 Units Subcutaneous QHS  . metoprolol succinate  50 mg Oral Daily  . pantoprazole  40 mg Oral Daily  . predniSONE  5 mg Oral Q breakfast   Continuous Infusions: . levofloxacin (LEVAQUIN) IV 750 mg (04/20/17 1700)     LOS: 4 days    Time spent: 20 minutes   Noralee Stain, DO [facility] [email or web address] Password [facility] 04/21/2017, 12:47 PM

## 2017-04-21 NOTE — Clinical Social Work Note (Signed)
Clinical Social Work Assessment  Patient Details  Name: Andrea Yu MRN: [id number] Date of Birth: [birth year]  Date of referral:  04/21/17               Reason for consult:  Facility Placement                Permission sought to share information with:  Other(SNF) Permission granted to share information::  Yes, Release of Information Signed  Name::        Agency::   [facility]   Relationship::     Contact Information:     Housing/Transportation Living arrangements for the past 2 months:  Single Family Home Source of Information:  Patient Patient Interpreter Needed:  None Criminal Activity/Legal Involvement Pertinent to Current Situation/Hospitalization:  No - Comment as needed Significant Relationships:  Spouse, Adult Children Lives with:  Spouse Do you feel safe going back to the place where you live?  Yes Need for family participation in patient care:     Care giving concerns: Patient fell a couple days ago at home no pain at that time but tonight developed back spams/pain no bowel or bladder problems.    Social Worker assessment / plan:  CSW met with patient via bedside to discuss options of care regarding skilled nursing facilities once ready for discharge. Patient was residing at her own residence with her spouse, Andrea Yu, prior to coming to the hospital. Patient did inform this Probation officer that she did reside at [facility] about a year ago for rehab purposes.   Patient informed this Probation officer that if SNF were needed she would want to return to [facility]. However, patient did state that she has several supports at her home including adult children and friends that she would like to return to once discharged. Patients stated, " I would rather go back home than to [place] if I can".   Patient informed this Probation officer that she has the necessary equipment such as wheels chairs, ramps, and other necessities needed to better her health at  home if that were an option.    Employment status:  Retired Nurse, adult PT Recommendations:  [place] / Referral to community resources:  [place]  Patient/Family's Response to care:  Patient was receptive to interventions being provided to her. Patient observed to be proactive and motivated to get healthy while at her own residence. However patient was willing to go to SNF if that were to be a safer discharge plan for her.  Patient/Family's Understanding of and Emotional Response to Diagnosis, Current Treatment, and Prognosis:  Patient was understanding of current diagnosis and treatment. Patient is aware of her functional abilities.  Emotional Assessment Appearance:  Appears stated age Attitude/Demeanor/Rapport:    Affect (typically observed):  Accepting, Pleasant, Appropriate Orientation:  Oriented to Self, Oriented to Place, Oriented to  Time, Oriented to Situation Alcohol / Substance use:    Psych involvement (Current and /or in the community):  No (Comment)  Discharge Needs  Concerns to be addressed:  No discharge needs identified Readmission within the last 30 days:  No Current discharge risk:  None Barriers to Discharge:  No Barriers Identified   Andrea Yu, Student-Social Work 04/21/2017, 2:50 PM

## 2017-04-21 NOTE — Progress Notes (Signed)
[facility]ious [facility]   Reason for visit: Follow up on bacteremia  Interval History: repeat blood cultures have remained negative; fluid from hematoma with no growth to date; moans in pain with movement but no new complaints.   Physical Exam: Constitutional:  Vitals:   04/21/17 0900 04/21/17 1200  BP: (!) 145/52 135/71  Pulse: 78 78  Resp: 13 13  Temp:    SpO2: 100% 100%   patient appears in NAD Eyes: anicteric Respiratory: Normal respiratory effort; CTA B Cardiovascular: RRR GI: soft, nt, nd  Review of Systems: Constitutional: positive for back pain continues or negative for fevers and chills Gastrointestinal: negative for diarrhea Integument/breast: negative for rash  Lab Results  Component Value Date   WBC 10.7 (H) 04/21/2017   HGB 9.4 (L) 04/21/2017   HCT 29.0 (L) 04/21/2017   MCV 86.8 04/21/2017   PLT 293 04/21/2017    Lab Results  Component Value Date   CREATININE 0.52 04/21/2017   BUN 13 04/21/2017   NA 139 04/21/2017   K 3.3 (L) 04/21/2017   CL 109 04/21/2017   CO2 22 04/21/2017    Lab Results  Component Value Date   ALT 11 (L) 04/18/2017   AST 16 04/18/2017   ALKPHOS 91 04/18/2017     Microbiology: Recent Results (from the past 240 hour(s))  Blood Culture (routine x 2)     Status: Abnormal   Collection Time: 04/17/17  6:34 AM  Result Value Ref Range Status   Specimen Description   Final    BLOOD LEFT ANTECUBITAL Performed at [facility], [place] [address] [place], [place] [address]    Special Requests   Final    BOTTLES DRAWN AEROBIC AND ANAEROBIC Blood Culture adequate volume Performed at [facility] [address] [place], [place] [address]    Culture  Setup Time   Final    GRAM POSITIVE COCCI IN CHAINS IN BOTH AEROBIC AND ANAEROBIC BOTTLES CRITICAL VALUE NOTED.  VALUE IS CONSISTENT WITH PREVIOUSLY REPORTED AND CALLED VALUE.    Culture (A)  Final    GROUP B  STREP(S.AGALACTIAE)ISOLATED SUSCEPTIBILITIES PERFORMED ON PREVIOUS CULTURE WITHIN THE LAST 5 DAYS. Performed at [facility], [place] [address], [place], [place] [address]    Report Status 04/19/2017 FINAL  Final  Blood Culture (routine x 2)     Status: Abnormal   Collection Time: 04/17/17  6:34 AM  Result Value Ref Range Status   Specimen Description   Final    BLOOD RIGHT ANTECUBITAL Performed at [facility] [address], [place], [place] [address]    Special Requests   Final    BOTTLES DRAWN AEROBIC AND ANAEROBIC Blood Culture adequate volume Performed at [facility] [address] [place], [place] [address]    Culture  Setup Time   Final    GRAM POSITIVE COCCI IN CHAINS IN BOTH AEROBIC AND ANAEROBIC BOTTLES CRITICAL RESULT CALLED TO, READ BACK BY AND VERIFIED WITH: B.GREEN,PHARMD AT 2236 ON 04/17/17 BY G.MCADOO Performed at [facility], [place] [address] [place], [place] [address]    Culture GROUP B STREP(S.AGALACTIAE)ISOLATED (A)  Final   Report Status 04/19/2017 FINAL  Final   Organism ID, Bacteria GROUP B STREP(S.AGALACTIAE)ISOLATED  Final      Susceptibility   Group b strep(s.agalactiae)isolated - MIC*    CLINDAMYCIN >=1 RESISTANT Resistant     AMPICILLIN <=0.25 SENSITIVE Sensitive     ERYTHROMYCIN >=  8 RESISTANT Resistant     VANCOMYCIN 0.25 SENSITIVE Sensitive     CEFTRIAXONE <=0.12 SENSITIVE Sensitive     LEVOFLOXACIN 1 SENSITIVE Sensitive     * GROUP B STREP(S.AGALACTIAE)ISOLATED  Blood Culture ID Panel (Reflexed)     Status: Abnormal   Collection Time: 04/17/17  6:34 AM  Result Value Ref Range Status   Enterococcus species NOT DETECTED NOT DETECTED Final   Listeria monocytogenes NOT DETECTED NOT DETECTED Final   Staphylococcus species NOT DETECTED NOT DETECTED Final   Staphylococcus aureus NOT DETECTED NOT DETECTED Final   Streptococcus species DETECTED (A) NOT DETECTED Final    Comment: CRITICAL RESULT CALLED  TO, READ BACK BY AND VERIFIED WITH: B.GREEN,PHARMD AT 2236 ON 04/17/17 BY G.MCADOO    Streptococcus agalactiae DETECTED (A) NOT DETECTED Final    Comment: CRITICAL RESULT CALLED TO, READ BACK BY AND VERIFIED WITH: B.GREEN,PHARMD AT 2236 ON 04/17/17 BY G.MCADOO    Streptococcus pneumoniae NOT DETECTED NOT DETECTED Final   Streptococcus pyogenes NOT DETECTED NOT DETECTED Final   Acinetobacter baumannii NOT DETECTED NOT DETECTED Final   Enterobacteriaceae species NOT DETECTED NOT DETECTED Final   Enterobacter cloacae complex NOT DETECTED NOT DETECTED Final   Escherichia coli NOT DETECTED NOT DETECTED Final   Klebsiella oxytoca NOT DETECTED NOT DETECTED Final   Klebsiella pneumoniae NOT DETECTED NOT DETECTED Final   Proteus species NOT DETECTED NOT DETECTED Final   Serratia marcescens NOT DETECTED NOT DETECTED Final   Haemophilus influenzae NOT DETECTED NOT DETECTED Final   Neisseria meningitidis NOT DETECTED NOT DETECTED Final   Pseudomonas aeruginosa NOT DETECTED NOT DETECTED Final   Candida albicans NOT DETECTED NOT DETECTED Final   Candida glabrata NOT DETECTED NOT DETECTED Final   Candida krusei NOT DETECTED NOT DETECTED Final   Candida parapsilosis NOT DETECTED NOT DETECTED Final   Candida tropicalis NOT DETECTED NOT DETECTED Final    Comment: Performed at [facility], [place] [address], [place], [place] [address]  Urine culture     Status: Abnormal   Collection Time: 04/17/17  6:49 AM  Result Value Ref Range Status   Specimen Description   Final    URINE, CATHETERIZED Performed at [facility] [address], [place],  [address]    Special Requests   Final    NONE Performed at [facility], [place] [address], [place], [place] [address]    Culture 80,000 COLONIES/mL ESCHERICHIA COLI (A)  Final   Report Status 04/19/2017 FINAL  Final   Organism ID, Bacteria ESCHERICHIA COLI (A)  Final      Susceptibility   Escherichia coli - MIC*      AMPICILLIN 8 SENSITIVE Sensitive     CEFAZOLIN <=4 SENSITIVE Sensitive     CEFTRIAXONE <=1 SENSITIVE Sensitive     CIPROFLOXACIN <=0.25 SENSITIVE Sensitive     GENTAMICIN <=1 SENSITIVE Sensitive     IMIPENEM <=0.25 SENSITIVE Sensitive     NITROFURANTOIN <=16 SENSITIVE Sensitive     TRIMETH/SULFA <=20 SENSITIVE Sensitive     AMPICILLIN/SULBACTAM 4 SENSITIVE Sensitive     PIP/TAZO <=4 SENSITIVE Sensitive     Extended ESBL NEGATIVE Sensitive     * 80,000 COLONIES/mL ESCHERICHIA COLI  MRSA PCR Screening     Status: None   Collection Time: 04/17/17 12:56 PM  Result Value Ref Range Status   MRSA by PCR NEGATIVE NEGATIVE Final    Comment:        The GeneXpert MRSA Assay (FDA approved  for NASAL specimens only), is one component of a comprehensive MRSA colonization surveillance program. It is not intended to diagnose MRSA infection nor to guide or monitor treatment for MRSA infections. Performed at [facility], [place] [address], [place], [place] [address]   Aerobic/Anaerobic Culture (surgical/deep wound)     Status: None (Preliminary result)   Collection Time: 04/17/17  3:04 PM  Result Value Ref Range Status   Specimen Description WOUND  Final   Special Requests   Final    FLUID FROM RIGHT GLUTEAL/ISCHIAL COLLECTION. Performed at [facility], [place] [address], [place], [place] [address]    Gram Stain   Final    MODERATE WBC PRESENT,BOTH PMN AND MONONUCLEAR NO ORGANISMS SEEN    Culture   Final    NO GROWTH 4 DAYS NO ANAEROBES ISOLATED; CULTURE IN PROGRESS FOR 5 DAYS Performed at [facility], [place] [address], [place], [place] [address]    Report Status PENDING  Incomplete  Aerobic/Anaerobic Culture (surgical/deep wound)     Status: None (Preliminary result)   Collection Time: 04/17/17  3:06 PM  Result Value Ref Range Status   Specimen Description WOUND  Final   Special Requests   Final    FLUID FROM RIGHT TROCHANTERIC  HEMATOMA. Performed at [facility], [place] [address], [place], [place] [address]    Gram Stain   Final    MODERATE WBC PRESENT,BOTH PMN AND MONONUCLEAR NO ORGANISMS SEEN    Culture   Final    NO GROWTH 4 DAYS NO ANAEROBES ISOLATED; CULTURE IN PROGRESS FOR 5 DAYS Performed at [facility], [place] [address], [place], [place] [address]    Report Status PENDING  Incomplete  Culture, blood (routine x 2)     Status: None (Preliminary result)   Collection Time: 04/19/17  1:58 PM  Result Value Ref Range Status   Specimen Description   Final    BLOOD RIGHT ANTECUBITAL Performed at [facility] [address] [place], [place] [address]    Special Requests   Final    BOTTLES DRAWN AEROBIC AND ANAEROBIC Blood Culture adequate volume Performed at [facility] [address], [place], [place] [address]    Culture   Final    NO GROWTH 2 DAYS Performed at [facility] [address], [place], [place] [address]    Report Status PENDING  Incomplete  Culture, blood (routine x 2)     Status: None (Preliminary result)   Collection Time: 04/19/17  2:00 PM  Result Value Ref Range Status   Specimen Description   Final    BLOOD RIGHT HAND Performed at [facility] [address], [place], [place] [address]    Special Requests   Final    BOTTLES DRAWN AEROBIC ONLY Blood Culture adequate volume Performed at [facility] [address] [place], [place] [address]    Culture   Final    NO GROWTH 2 DAYS Performed at [facility] [address], [place], [place] [address]    Report Status PENDING  Incomplete    Impression/Plan:  1. Osteomyelitis - I am concerned with osteomyelitis based on CT findings, pain, fluid and bacteremia.  She is covered with levaquin and oral levaquin at discharge is good at 750 mg daily.  She will need a prolonged course for 4-6 weeks.   I will check a  baseline ESR, CRP  2.  GBS bacteremia - TTE without signs of vegetation.  TEE not absolutely indicated at this time if repeat blood cultures remain negative, as she will need prolonged antibiotics regardless.    3.   Antibiotic allergy - severe [penicillin allergy so will avoid penicillins and cephalosporins since there is a viable alternative.

## 2017-04-21 NOTE — Evaluation (Signed)
Physical Therapy Evaluation Patient Details Name: Andrea Yu MRN: [id number] DOB: 20-Mar-1940 Today's Date: 04/21/2017   History of Present Illness   77 y.o. female with medical history significant of atrial fibrillation, psoriatic arthritis, low back pain, peripheral neuropathy, osteoarthritis and multiple joint deformities, and type 2 diabetes mellitus. Admitted with LBP and AMS. Dx of sepsis.  Clinical Impression  Pt admitted with above diagnosis. Pt currently with functional limitations due to the deficits listed below (see PT Problem List). +2 total assist for supine to sit, pt unable to tolerate sitting on edge of bed 2* severe low back pain. Returned to supine. Will likely need ST-SNF.  Pt will benefit from skilled PT to increase their independence and safety with mobility to allow discharge to the venue listed below.       Follow Up Recommendations SNF    Equipment Recommendations  None recommended by PT    Recommendations for Other Services       Precautions / Restrictions Precautions Precautions: Fall Restrictions Weight Bearing Restrictions: No      Mobility  Bed Mobility Overal bed mobility: Needs Assistance Bed Mobility: Supine to Sit;Sit to Supine     Supine to sit: +2 for physical assistance;Total assist Sit to supine: Total assist;+2 for physical assistance   General bed mobility comments: assist to advance BLEs and raise trunk; pt reported she couldn't tolerate sitting 2* LBP so assisted her back to supine  Transfers                 General transfer comment: NT-limited by pain  Ambulation/Gait                Stairs            Wheelchair Mobility    Modified Rankin (Stroke Patients Only)       Balance Overall balance assessment: Needs assistance Sitting-balance support: Feet supported;Bilateral upper extremity supported Sitting balance-Leahy Scale: Fair                                       Pertinent  Vitals/Pain Pain Assessment: 0-10 Pain Score: 10-Worst pain ever Pain Location: back with movement Pain Descriptors / Indicators: Sore Pain Intervention(s): Limited activity within patient's tolerance;Monitored during session;RN gave pain meds during session;Repositioned    Home Living Family/patient expects to be discharged to:: Private residence Living Arrangements: Spouse/significant other Available Help at Discharge: Family Type of Home: House Home Access: Ramped entrance     Home Layout: One level Home Equipment: Insurance underwriter - 2 wheels;Shower seat Additional Comments: husband helps with shower transfers    Prior Function Level of Independence: Needs assistance      ADL's / Homemaking Assistance Needed: hasband assists  Comments: independent transfer to scooter, hasn't walked in 3 years     Hand Dominance        Extremity/Trunk Assessment   Upper Extremity Assessment Upper Extremity Assessment: Generalized weakness    Lower Extremity Assessment Lower Extremity Assessment: LLE deficits/detail;RLE deficits/detail RLE: Unable to fully assess due to pain RLE Sensation: history of peripheral neuropathy;decreased light touch LLE: Unable to fully assess due to pain LLE Sensation: history of peripheral neuropathy;decreased light touch    Cervical / Trunk Assessment Cervical / Trunk Assessment: Kyphotic  Communication   Communication: No difficulties  Cognition Arousal/Alertness: Awake/alert Behavior During Therapy: WFL for tasks assessed/performed Overall Cognitive Status: Within Functional Limits for tasks assessed  General Comments      Exercises     Assessment/Plan    PT Assessment Patient needs continued PT services  PT Problem List Decreased strength;Decreased balance;Decreased mobility;Decreased activity tolerance;Pain       PT Treatment Interventions DME instruction;Functional  mobility training;Therapeutic activities;Therapeutic exercise;Balance training;Patient/family education    PT Goals (Current goals can be found in the Care Plan section)  Acute Rehab PT Goals Patient Stated Goal: hopes to go home but agreeable to SNF PT Goal Formulation: With patient Potential to Achieve Goals: Fair    Frequency Min 2X/week   Barriers to discharge        Co-evaluation               AM-PAC PT "6 Clicks" Daily Activity  Outcome Measure Difficulty turning over in bed (including adjusting bedclothes, sheets and blankets)?: Unable Difficulty moving from lying on back to sitting on the side of the bed? : Unable Difficulty sitting down on and standing up from a chair with arms (e.g., wheelchair, bedside commode, etc,.)?: Unable Help needed moving to and from a bed to chair (including a wheelchair)?: Total Help needed walking in hospital room?: Total Help needed climbing 3-5 steps with a railing? : Total 6 Click Score: 6    End of Session   Activity Tolerance: Patient limited by pain Patient left: in bed;with bed alarm set;with family/visitor present;with call bell/phone within reach Nurse Communication: Mobility status PT Visit Diagnosis: Pain;Muscle weakness (generalized) (M62.81);Other abnormalities of gait and mobility (R26.89)    Time: 6962-9528 PT Time Calculation (min) (ACUTE ONLY): 20 min   Charges:   PT Evaluation $PT Eval Moderate Complexity: 1 Mod     PT G Codes:          Tamala Ser 04/21/2017, 1:11 PM [phone]

## 2017-04-21 NOTE — [place] FL2 (Signed)
[place] MEDICAID FL2 LEVEL OF CARE SCREENING TOOL     IDENTIFICATION  Patient Name: Andrea Yu Birthdate: 1940/04/02 Sex: female Admission Date (Current Location): 04/17/2017  [facility] and [place] Number:  Producer, television/film/video and Address:  [facility],  [address] [place], [place] [address]      Provider Number: [id number]  Attending Physician Name and Address:  Noralee Stain, DO  Relative Name and Phone Number:       Current Level of Care: Hospital Recommended Level of Care: Skilled Nursing Facility Prior Approval Number:    Date Approved/Denied:   PASRR Number: [id number]  Discharge Plan: SNF    Current Diagnoses: Patient Active Problem List   Diagnosis Date Noted  . Sepsis due to group B Streptococcus (HCC) 04/19/2017  . Stage I pressure ulcer of sacral region 04/19/2017  . Altered mental status 04/17/2017  . Hip osteomyelitis, right (HCC) 04/17/2017  . Pressure injury of skin 04/17/2017  . History of blood transfusion   . Head injury, closed, with concussion   . GERD (gastroesophageal reflux disease)   . Dysrhythmia   . Complication of anesthesia   . Arthritis   . Anemia   . Displaced spiral fracture of shaft of right tibia, subsequent encounter for closed fracture with delayed healing 11/11/2016  . 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 Subacute osteomyelitis, right ankle and foot (HCC)   . SAH (subarachnoid hemorrhage) (HCC) 09/14/2014  . Atrial fibrillation with RVR (HCC) 09/14/2014  . Hypertension 09/14/2014  . Laceration of eyebrow, left 09/14/2014  . Fall 09/14/2014  . Gastritis, acute 11/11/2012  . Cellulitis of leg, right 11/11/2012  . Obesity (BMI 30-39.9)- suspected sleep apnea, declines sleep study 11/10/2012  . Psoriatic arthritis (HCC) 11/06/2012  . Iron deficiency  anemia- transfused this admission 11/06/2012  . Depression 11/06/2012  . Peripheral neuropathy 11/06/2012  . Shock circulatory on admission 11/06/12 11/06/2012  . Systemic inflammatory response syndrome (SIRS) (HCC) 11/06/2012  . Nausea and vomiting 08/09/2012  . Adrenal insufficiency (HCC) 08/09/2012  . PAF (paroxysmal atrial fibrillation) (HCC) 08/09/2012  . Hypotension 08/09/2012  . Dehydration 08/09/2012    Orientation RESPIRATION BLADDER Height & Weight     Self, Time, Situation, Place  Normal Continent Weight: 175 lb 0.7 oz (79.4 kg) Height:  5\' 3"  (160 cm)  BEHAVIORAL SYMPTOMS/MOOD NEUROLOGICAL BOWEL NUTRITION STATUS      Continent Diet(Carb Modified. )  AMBULATORY STATUS COMMUNICATION OF NEEDS Skin   Extensive Assist Non-Verbally PU Stage and Appropriate Care(Incision Right Buttock)     PU Stage 3 Dressing: (Pressure Injury Heel)                 Personal Care Assistance Level of Assistance  Bathing, Feeding, Dressing Bathing Assistance: Limited assistance Feeding assistance: Independent Dressing Assistance: Limited assistance     Functional Limitations Info  Sight, Hearing, Speech Sight Info: Impaired Hearing Info: Adequate Speech Info: Adequate    SPECIAL CARE FACTORS FREQUENCY  PT (By licensed PT), OT (By licensed OT)     PT Frequency: 5x/week OT Frequency: 5x/week            Contractures Contractures Info: Not present    Additional Factors Info  Code Status, Allergies Code Status Info: Fullcode  Allergies Info: Allergies: Penicillins, Prolia Denosumab, Fosamax Alendronate Sodium  Current Medications (04/21/2017):  This is the current hospital active medication list Current Facility-Administered Medications  Medication Dose Route Frequency Provider Last Rate Last Dose  . acetaminophen (TYLENOL) tablet 650 mg  650 mg Oral Q6H PRN Amin, Ankit Chirag, MD       Or  . acetaminophen (TYLENOL) suppository 650 mg  650 mg Rectal Q6H PRN  Amin, Ankit Chirag, MD      . albuterol (PROVENTIL) (2.5 MG/3ML) 0.083% nebulizer solution 2.5 mg  2.5 mg Nebulization Q4H PRN Leda Gauze, NP   2.5 mg at 04/20/17 0044  . aspirin EC tablet 81 mg  81 mg Oral QPM Amin, Ankit Chirag, MD   81 mg at 04/20/17 1805  . bisacodyl (DULCOLAX) EC tablet 5 mg  5 mg Oral Daily PRN Amin, Ankit Chirag, MD      . diltiazem (CARDIZEM CD) 24 hr capsule 180 mg  180 mg Oral QHS Noralee Stain, DO      . DULoxetine (CYMBALTA) DR capsule 90 mg  90 mg Oral Daily Amin, Ankit Chirag, MD   90 mg at 04/21/17 0936  . gabapentin (NEURONTIN) capsule 1,200 mg  1,200 mg Oral QHS Purohit, Shrey C, MD   1,200 mg at 04/20/17 2050  . gabapentin (NEURONTIN) capsule 600 mg  600 mg Oral Q breakfast Purohit, Shrey C, MD   600 mg at 04/21/17 0851  . heparin injection 5,000 Units  5,000 Units Subcutaneous Q8H Amin, Ankit Chirag, MD   5,000 Units at 04/21/17 1510  . HYDROcodone-acetaminophen (NORCO/VICODIN) 5-325 MG per tablet 1-2 tablet  1-2 tablet Oral Q4H PRN Dimple Nanas, MD   1 tablet at 04/21/17 1205  . insulin aspart (novoLOG) injection 0-15 Units  0-15 Units Subcutaneous TID WC Amin, Ankit Chirag, MD      . insulin aspart (novoLOG) injection 0-5 Units  0-5 Units Subcutaneous QHS Amin, Ankit Chirag, MD      . levofloxacin (LEVAQUIN) IVPB 750 mg  750 mg Intravenous Q24H Ginnie Smart, MD   Stopped at 04/21/17 1641  . methocarbamol (ROBAXIN) tablet 500 mg  500 mg Oral Q8H PRN Marinda Elk, MD   500 mg at 04/21/17 1205  . metoprolol succinate (TOPROL-XL) 24 hr tablet 50 mg  50 mg Oral Daily Noralee Stain, DO   50 mg at 04/21/17 0936  . morphine 4 MG/ML injection 2 mg  2 mg Intravenous Q4H PRN Stevie Kern A, NP   2 mg at 04/19/17 0313  . ondansetron (ZOFRAN) tablet 4 mg  4 mg Oral Q6H PRN Amin, Ankit Chirag, MD       Or  . ondansetron (ZOFRAN) injection 4 mg  4 mg Intravenous Q6H PRN Amin, Ankit Chirag, MD      . pantoprazole (PROTONIX) EC tablet  40 mg  40 mg Oral Daily Amin, Ankit Chirag, MD   40 mg at 04/21/17 0936  . predniSONE (DELTASONE) tablet 5 mg  5 mg Oral Q breakfast Amin, Ankit Chirag, MD   5 mg at 04/21/17 0851  . senna-docusate (Senokot-S) tablet 1 tablet  1 tablet Oral QHS PRN Amin, Loura Halt, MD         Discharge Medications: Please see discharge summary for a list of discharge medications.  Relevant Imaging Results:  Relevant Lab Results:   Additional Information SSN: [id number]  Clearance Coots, LCSW

## 2017-04-22 DIAGNOSIS — Z881 Allergy status to other antibiotic agents status: Secondary | ICD-10-CM

## 2017-04-22 LAB — CBC
HCT: 25.6 % — ABNORMAL LOW (ref 36.0–46.0)
HEMOGLOBIN: 8.2 g/dL — AB (ref 12.0–15.0)
MCH: 27.9 pg (ref 26.0–34.0)
MCHC: 32 g/dL (ref 30.0–36.0)
MCV: 87.1 fL (ref 78.0–100.0)
Platelets: 333 10*3/uL (ref 150–400)
RBC: 2.94 MIL/uL — AB (ref 3.87–5.11)
RDW: 15.5 % (ref 11.5–15.5)
WBC: 7.8 10*3/uL (ref 4.0–10.5)

## 2017-04-22 LAB — BASIC METABOLIC PANEL
Anion gap: 6 (ref 5–15)
BUN: 10 mg/dL (ref 6–20)
CHLORIDE: 111 mmol/L (ref 101–111)
CO2: 24 mmol/L (ref 22–32)
CREATININE: 0.46 mg/dL (ref 0.44–1.00)
Calcium: 8.6 mg/dL — ABNORMAL LOW (ref 8.9–10.3)
GFR calc Af Amer: >60 mL/min (ref >60)
Glucose, Bld: 90 mg/dL (ref 65–99)
POTASSIUM: 3.5 mmol/L (ref 3.5–5.1)
SODIUM: 141 mmol/L (ref 135–145)

## 2017-04-22 LAB — AEROBIC/ANAEROBIC CULTURE (SURGICAL/DEEP WOUND): CULTURE: NO GROWTH

## 2017-04-22 LAB — GLUCOSE, CAPILLARY
Glucose-Capillary: 68 mg/dL (ref 65–99)
Glucose-Capillary: 133 mg/dL — ABNORMAL HIGH (ref 65–99)
Glucose-Capillary: 114 mg/dL — ABNORMAL HIGH (ref 65–99)

## 2017-04-22 LAB — AEROBIC/ANAEROBIC CULTURE W GRAM STAIN (SURGICAL/DEEP WOUND): Culture: NO GROWTH

## 2017-04-22 LAB — C-REACTIVE PROTEIN: CRP: 18.4 mg/dL — ABNORMAL HIGH (ref <1.0)

## 2017-04-22 LAB — SEDIMENTATION RATE: SED RATE: 105 mm/h — AB (ref 0–22)

## 2017-04-22 MED ORDER — METOPROLOL SUCCINATE ER 50 MG PO TB24: 50.0000 mg | ORAL_TABLET | Freq: Every day | ORAL | 0 refills | Status: DC

## 2017-04-22 MED ORDER — METHOCARBAMOL 500 MG PO TABS: 500.0000 mg | ORAL_TABLET | Freq: Three times a day (TID) | ORAL | 0 refills | Status: DC | PRN

## 2017-04-22 MED ORDER — HYDROCODONE-ACETAMINOPHEN 5-325 MG PO TABS: 1.0000 | ORAL_TABLET | ORAL | 0 refills | Status: DC | PRN

## 2017-04-22 MED ORDER — LEVOFLOXACIN 750 MG PO TABS: 750.0000 mg | ORAL_TABLET | Freq: Every day | ORAL | 0 refills | Status: DC

## 2017-04-22 NOTE — Clinical Social Work Placement (Signed)
English as a second language teacher received.  D/C Summary sent [facility] to transport. informed to bring Oxygen.    CLINICAL SOCIAL WORK PLACEMENT  NOTE  Date:  04/22/2017  Patient Details  Name: Andrea Yu MRN: [id number] Date of Birth: 1941-01-19  Clinical Social Work is seeking post-discharge placement for this patient at the Skilled  Nursing Facility level of care (*CSW will initial, date and re-position this form in  chart as items are completed):  Yes   Patient/family provided with [facility] Clinical Social Work Department's list of facilities offering this level of care within the geographic area requested by the patient (or if unable, by the patient's family).  Yes   Patient/family informed of their freedom to choose among providers that offer the needed level of care, that participate in Medicare, Medicaid or managed care program needed by the patient, have an available bed and are willing to accept the patient.  Yes   Patient/family informed of [facility]'s ownership interest in [facility] and [facility], as well as of the fact that they are under no obligation to receive care at these facilities.  PASRR submitted to EDS on       PASRR number received on       Existing PASRR number confirmed on 04/07/17     FL2 transmitted to all facilities in geographic area requested by pt/family on       FL2 transmitted to all facilities within larger geographic area on 04/22/17     Patient informed that his/her managed care company has contracts with or will negotiate with certain facilities, including the following:  [facility] [place]     Yes   Patient/family informed of bed offers received.  Patient chooses bed at [facility] [place]     Physician recommends and patient chooses bed at      Patient to be transferred to [facility] [place] on 04/22/17.  Patient to be transferred to facility by [facility]     Patient family notified on 04/22/17 of transfer.  Name of  family member notified:        PHYSICIAN       Additional Comment:    _______________________________________________ Clearance Coots, LCSW 04/22/2017, 2:57 PM

## 2017-04-22 NOTE — Care Management Note (Signed)
Case Management Note  Patient Details  Name: ARYNN ARMAND MRN: [id number] Date of Birth: 06-Jan-1941  Subjective/Objective:                    Action/Plan:d/c SNF.   Expected Discharge Date:  04/19/17               Expected Discharge Plan:  Skilled Nursing Facility  In-House Referral:  Clinical Social Work  Discharge planning Services  CM Consult  Post Acute Care Choice:    Choice offered to:     DME Arranged:    DME Agency:     HH Arranged:    HH Agency:     Status of Service:  Completed, signed off  If discussed at [employer], dates discussed:    Additional Comments:  Lanier Clam, RN 04/22/2017, 11:39 AM

## 2017-04-22 NOTE — Discharge Summary (Signed)
Physician Discharge Summary  [name]:[id number] DOB: November 10, 1940 DOA: 04/17/2017  PCP: Andrea Matin, MD  Admit date: 04/17/2017 Discharge date: 04/22/2017  Admitted From: Home Disposition:  SNF  Recommendations for Outpatient Follow-up:  1. Follow up with PCP in 1 week 2. Follow up with Dr. Luciana Yu, infectious disease. They will arrange follow up.  3. Please obtain weekly BMP while on levaquin. Last day antibiotic 05/26/2017.  4. Please follow up on the following pending results: Final blood culture results obtained on 3/26 and wound culture result from 3/24.  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  Orthopedic surgery was consulted, she underwent drainage by IR on 3/24, findings compatible with hematoma.  Culture has been negative to date.  Blood culture on 3/24 resulted positive for group B strep.  Repeat blood culture 3/26 have been negative to date.  Infectious disease was consulted.  Patient has anaphylactic reactions to penicillin.  She has been recommended for oral Levaquin 750 mg for prolonged course, last date 5/2.  She needs weekly BMP while on this medication.  Physical therapy work with patient, recommended for skilled nursing  facility which patient was in agreement with.  I was unable to reach family on day of discharge for update.  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 S/p drainage by IR on 04/17/2017, findings compatible with hematomas, drain removed  -Fluid cultures negative to date  -Blood culture 3/24 with group B strep  -Repeat blood culture 3/26 negative to date  -ID consulted; due to anaphylactic reaction to PCN, patient is now on levaquin 750mg  daily until 05/26/17. Need weekly BMP. Follow up with ID as outpatient.   Acute encephalopathy -Appears to have resolved  Atrial fibrillation with rapid ventricular response -Rate well controlled today  -Continue metoprolol - dose decreased due to bradycardic episodes, diltiazem, aspirin  -Patient is not a candidate for anticoagulation due to prior intracranial hemorrhages and falls  ?UTI vs asymptomatic bacteruria  -Urine culture with E Coli  -Continue levaquin   Psoriatic arthritis -Continue home prednisone 5 mg, continue PPI  -Hold homeapremilast 30mg  BID due to sepsis. May resume at discharge.   Chronic pain -Continue duloxetine, norco, gabapentin, robaxin. Prescription for norco and robaxin provided.  Hypertension -Resume home losartan, furosemide   DM -SSI    Osteoporosis -Resume teriparatide, vitamin D supplementation   Discharge Instructions  Discharge Instructions    Diet - low sodium heart healthy   Complete by:  As directed    Increase activity slowly   Complete by:  As directed      Allergies as of 04/22/2017      Reactions   Penicillins Anaphylaxis   THROAT SWELLING Has patient had a PCN reaction causing immediate rash, facial/tongue/throat swelling, SOB or lightheadedness with hypotension:  # # YES # #  Has patient had a PCN reaction causing severe rash involving mucus membranes or skin necrosis: # # # YES # # # Has patient had a PCN reaction that required hospitalization:No Has patient had a PCN reaction occurring within the last 10 years:  # # YES # #  If all of the above answers are "NO", then may proceed with Cephalosporin use.   Prolia [denosumab] Other (See Comments)   Severe Pain in the groin area.   Fosamax [alendronate Sodium]    UNSPECIFIED REACTION       Medication List    STOP taking these medications   BIOTIN 5000 PO     TAKE these medications   acetaminophen 325 MG tablet Commonly known as:  TYLENOL Take 2 tablets (650 mg total) by mouth every 4 (four) hours as needed. What changed:    how much to take  when to take this  reasons to take this   aspirin EC 81 MG tablet Take 81 mg by mouth every evening.   DILT-XR 180 MG 24 hr capsule Generic drug:  diltiazem Take 180 mg by mouth at bedtime.   DULoxetine 30 MG capsule Commonly known as:  CYMBALTA Take 30 mg by mouth daily. Take along with 60 mg capsule=90 mg   DULoxetine 60 MG capsule Commonly known as:  CYMBALTA Take 60 mg by mouth daily. Take along with 30mg  capsule to make a total of 90mg    FORTEO 600 MCG/2.4ML Soln Generic drug:  Teriparatide (Recombinant) Inject 20 mcg into the skin daily.   furosemide 80 MG tablet Commonly known as:  LASIX Take 80 mg by mouth. What changed:  Another medication with the same name was  removed. Continue taking this medication, and follow the directions you see here.   gabapentin 600 MG tablet Commonly known as:  NEURONTIN Take 600-1,200 mg by mouth 2 (two) times daily. 1 tablet in morning and 2 tablets at night   HYDROcodone-acetaminophen 5-325 MG tablet Commonly known as:  NORCO/VICODIN Take 1-2 tablets by mouth every 4 (four) hours as needed. What changed:    when to take this  reasons to take this   levofloxacin 750 MG tablet Commonly known as:  LEVAQUIN Take 1 tablet (750 mg total) by mouth daily.   losartan 100 MG tablet Commonly known as:  COZAAR Take 100 mg by mouth daily.   Magnesium 250 MG Tabs Take 1 tablet by mouth every evening.   methocarbamol 500 MG tablet Commonly known as:  ROBAXIN Take 1 tablet (500 mg total) by mouth every 8 (eight) hours as needed for muscle spasms.   metoprolol succinate 50 MG 24 hr tablet Commonly known as:  TOPROL-XL Take 1 tablet (50 mg total) by mouth daily. Take with or immediately following a meal. What changed:    medication strength  how much to take  additional instructions   multivitamin with minerals Tabs tablet Take 1 tablet  by mouth daily.   mupirocin ointment 2 % Commonly known as:  BACTROBAN Apply 1 application topically 2 (two) times daily. Apply to the affected area 2 times a day   omeprazole 20 MG capsule Commonly known as:  PRILOSEC Take 20 mg by mouth every evening.   OTEZLA 30 MG Tabs Generic drug:  Apremilast Take 30 mg by mouth 2 (two) times daily.   potassium chloride SA 20 MEQ tablet Commonly known as:  K-DUR,KLOR-CON Take 20 mEq by mouth 2 (two) times daily.   predniSONE 5 MG tablet Commonly known as:  DELTASONE Take 5 mg by mouth daily.   PROAIR HFA 108 (90 Base) MCG/ACT inhaler Generic drug:  albuterol Inhale 1-2 puffs into the lungs every 6 (six) hours as needed for wheezing or shortness of breath.   silver sulfADIAZINE 1 % cream Commonly known as:   SILVADENE Apply 1 application topically daily.   Vitamin D (Ergocalciferol) 50000 units Caps capsule Commonly known as:  DRISDOL Take 50,000 Units by mouth every Tuesday.      Follow-up Information    Andrea Matin, MD. Schedule an appointment as soon as possible for a visit in 1 week(s).   Specialty:  Internal Medicine Contact information: [address] [place] [address] [phone]        Gardiner Barefoot, MD Follow up.   Specialty:  Infectious Diseases Why:  Office will arrange for follow up  Contact information: [address] [place] [address] [phone]          Allergies  Allergen Reactions  . Penicillins Anaphylaxis    THROAT SWELLING  Has patient had a PCN reaction causing immediate rash, facial/tongue/throat swelling, SOB or lightheadedness with hypotension:  # # YES # #  Has patient had a PCN reaction causing severe rash involving mucus membranes or skin necrosis: # # # YES # # # Has patient had a PCN reaction that required hospitalization:No Has patient had a PCN reaction occurring within the last 10 years:  # # YES # #  If all of the above answers are "NO", then may proceed with Cephalosporin use.   Jake Seats [Denosumab] Other (See Comments)    Severe Pain in the groin area.  . Fosamax [Alendronate Sodium]     UNSPECIFIED REACTION     Consultations:  ID  Ortho  IR   Procedures/Studies: Dg Chest 2 View  Result Date: 04/17/2017 CLINICAL DATA:  Fever and severe back pain. EXAM: CHEST - 2 VIEW COMPARISON:  11/06/2012. FINDINGS: Poor inspiration. Normal sized heart. Clear lungs. Mild scoliosis. Lower thoracic spine degenerative changes. Superior bright creation of both humeral heads with bony remodeling and bilateral glenohumeral degenerative changes. IMPRESSION: 1. No acute abnormality. 2. Large bilateral chronic rotator cuff tears and bilateral shoulder degenerative changes. Electronically Signed   By: Beckie Salts M.D.    On: 04/17/2017 08:40   Dg Thoracic Spine 2 View  Result Date: 04/17/2017 CLINICAL DATA:  Pain after falling EXAM: THORACIC SPINE 2 VIEWS COMPARISON:  None. FINDINGS: Multilevel thoracic vertebral body height loss. No acute fracture. Normal alignment. IMPRESSION: No acute fracture or static subluxation of the thoracic spine. Electronically Signed   By: Deatra Robinson M.D.   On: 04/17/2017 06:30   Dg Lumbar Spine Complete  Result Date: 04/17/2017 CLINICAL DATA:  Fall with back pain EXAM: LUMBAR SPINE - COMPLETE 4+ VIEW COMPARISON:  Lumbar spine MRI 09/01/2015 FINDINGS: Posterior fusion hardware at L3-L5 is unchanged alignment. No abnormal lucency. No  acute fracture. Multilevel upper lumbar vertebral body height loss is unchanged. IMPRESSION: No acute fracture of the lumbar spine. Electronically Signed   By: Deatra Robinson M.D.   On: 04/17/2017 06:32   Ct Aspiration  Result Date: 04/17/2017 INDICATION: 77 year old with large fluid collections in the right pelvis. One collection in the right trochanteric region and a second collection in the right inferior gluteal region adjacent to the ischial tuberosity. Concern for underlying infection. EXAM: CT-GUIDED DRAIN PLACEMENT WITHIN THE RIGHT ISCHIAL FLUID COLLECTION CT-GUIDED ASPIRATION OF THE RIGHT FEMORAL TROCHANTERIC FLUID COLLECTION MEDICATIONS: The patient is currently admitted to the hospital and receiving intravenous antibiotics. The antibiotics were administered within an appropriate time frame prior to the initiation of the procedure. ANESTHESIA/SEDATION: 1.5 mg IV Versed 75 mcg IV Fentanyl Moderate Sedation Time:  42 minutes The patient was continuously monitored during the procedure by the interventional radiology nurse under my direct supervision. COMPLICATIONS: None immediate. TECHNIQUE: Informed written consent was obtained from the patient after a thorough discussion of the procedural risks, benefits and alternatives. All questions were addressed.  Maximal Sterile Barrier Technique was utilized including caps, mask, sterile gowns, sterile gloves, sterile drape, hand hygiene and skin antiseptic. A timeout was performed prior to the initiation of the procedure. PROCEDURE: Patient was placed prone. Images through the lower pelvis were obtained. Skin was marked over the right ischial and right trochanteric fluid collections. Right ischial region was prepped and draped in a sterile fashion. Skin was anesthetized with 1% lidocaine. 18 gauge trocar needle was directed into this collection with CT guidance and bloody fluid was aspirated. A stiff Amplatz wire was advanced into the collection. The tract was dilated to accommodate a 10 [place] drain. 100 mL of bloody fluid was removed. No additional fluid could be aspirated. Follow up CT images were obtained at this point. Fluid sample collected for culture. The drain was left in place at this time. Attention was directed to the right trochanteric fluid collection. A new sterile field was created. Skin was anesthetized with 1% lidocaine. 19 gauge Yueh catheter was directed into the right trochanteric fluid collection. 70 mL of bloody fluid was removed. Follow up CT images were obtained and confirmed decompression of the right trochanteric fluid collection. This Yueh catheter was removed. Fluid sample sent for culture. Attention was directed back to the 10 [place] drain. No additional fluid could be removed. Therefore, the drain was cut and completely removed. Bandages placed at both puncture sites. FINDINGS: Heterogeneous collection in the right inferior gluteal region and adjacent to the right ischial tuberosity. The collection looks like a hematoma on these non contrast images. The low-density component of the collection was successfully drained following placement of the 10 French drain. Findings are most compatible with a hematoma. The drain was removed in order to decrease the risk of hematoma contamination or  infection. Bloody fluid was removed from the right femoral trochanteric fluid collection. Majority of the right trochanteric fluid was removed. IMPRESSION: CT-guided drainage of the right trochanteric and right gluteal fluid collections. Findings are most compatible with hematomas. Samples from both collections were sent for culture. 10 French drain was placed within the right ischial/gluteal collection. However, this drain was removed at the end of the procedure in order to prevent hematoma contamination or infection. Electronically Signed   By: Richarda Overlie M.D.   On: 04/17/2017 16:08   Ct Angio Chest/abd/pel For Dissection W And/or Wo Contrast  Result Date: 04/17/2017 CLINICAL DATA:  Larey Seat a couple days ago  at home, tonight developed back spasms and pain, generalized acute abdominal pain, history GERD, hypertension paroxysmal atrial fibrillation EXAM: CT ANGIOGRAPHY CHEST, ABDOMEN AND PELVIS TECHNIQUE: Multidetector CT imaging through the chest, abdomen and pelvis was performed using the standard protocol during bolus administration of intravenous contrast. Multiplanar reconstructed images and MIPs were obtained and reviewed to evaluate the vascular anatomy. CONTRAST:  100 ML ISOVUE-370 IOPAMIDOL (ISOVUE-370) INJECTION 76% IV. No oral contrast. COMPARISON:  CT abdomen and pelvis 03/29/2013 FINDINGS: CTA CHEST FINDINGS Cardiovascular: Scattered atherosclerotic calcifications aorta, proximal great vessels and coronary arteries. No evidence of intramural hematoma on precontrast imaging. Normal aortic enhancement following contrast without aneurysm or dissection. No pericardial effusion. Pulmonary arteries grossly patent. Mediastinum/Nodes: Esophagus unremarkable. Base of cervical region normal appearance. No thoracic adenopathy. Lungs/Pleura: Minimal dependent atelectasis in the posterior lungs. Lungs otherwise clear. No acute infiltrate, pleural effusion, or pneumothorax. Tiny calcified pulmonary granuloma RIGHT  middle lobe image 51. Musculoskeletal: Scattered degenerative disc disease changes thoracic spine Review of the MIP images confirms the above findings. CTA ABDOMEN AND PELVIS FINDINGS VASCULAR Aorta: Atherosclerotic calcification and tortuosity of abdominal aorta. No evidence of aneurysm or dissection. Celiac: Mild atherosclerotic plaque formation at origin of celiac artery, estimated less than 50% narrowed SMA: Significant atherosclerotic plaque at origin of SMA, with greater than 50% narrowing Renals: Atherosclerotic plaque at origins of BILATERAL renal arteries probably greater than 50% bilaterally IMA: IMA patent Inflow: Scattered calcified plaques throughout the iliac and visualized femoral systems without aneurysm Veins: Unopacified at CTA imaging.  Scattered pelvic phleboliths. Review of the MIP images confirms the above findings. NON-VASCULAR Hepatobiliary: Gallbladder surgically absent. Mild fatty infiltration of liver without mass. Pancreas: Normal appearance Spleen: Normal appearance Adrenals/Urinary Tract: Adrenal glands, kidneys, ureters, and bladder normal appearance Stomach/Bowel: Appendix not visualized but no pericecal inflammatory process seen. Sigmoid diverticulosis without evidence of diverticulitis. Stomach and bowel loops otherwise unremarkable. Lymphatic: No adenopathy Reproductive: Uterus surgically absent with normal sized ovaries Other: Small umbilical and supraumbilical ventral hernias containing fat. No free intraperitoneal air or fluid. Musculoskeletal: Bones demineralized with evidence of prior lumbar fusion L3-L5. Mild chronic anterolisthesis at L4-L5. Multilevel degenerative disc disease changes of the thoracolumbar spine. Fluid collection identified lateral to the RIGHT hip joint overlying the femoral neck and greater trochanter, maximal dimensions of 6.6 x 4.2 x 7.2 cm. A separate fluid collection is identified posterior inferior to the RIGHT ischium at the inferior RIGHT buttock,  8.8 x 5.2 cm in axial dimensions image 185, extending at least 4.4 cm in craniocaudal length beyond the inferior extent of exam. Overlying skin thickening and subcutaneous infiltration at the posterior collection. Questionable bone destruction at the RIGHT ischium raises question of decubitus ulcer with potential osteomyelitis. No definite hip joint effusion. Review of the MIP images confirms the above findings. IMPRESSION: Scattered atherosclerotic calcifications involving the thoracic and abdominal aorta, coronary arteries, and abdominal visceral artery origins. No evidence of aortic aneurysm or dissection. 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 No acute intrathoracic abnormalities. Two RIGHT pelvic fluid collections are identified: 6.6 x 4.2 x 7.2 cm lateral to and abutting the RIGHT greater trochanter question trochanteric bursitis; and a second collection posteriorly overlying and extending caudal to the RIGHT ischial spine 8.8 x 5.2 x >4.4 cm in size, with overlying skin thickening, subcutaneous infiltration, and potential mild ischial destruction, highly suspicious for osteomyelitis and overlying abscess, potentially related to decubitus ulcer? Electronically Signed   By: Ulyses Southward M.D.   On: 04/17/2017 09:32  Ct Image Guided Drainage By Percutaneous Catheter  Result Date: 04/17/2017 INDICATION: 77 year old with large fluid collections in the right pelvis. One collection in the right trochanteric region and a second collection in the right inferior gluteal region adjacent to the ischial tuberosity. Concern for underlying infection. EXAM: CT-GUIDED DRAIN PLACEMENT WITHIN THE RIGHT ISCHIAL FLUID COLLECTION CT-GUIDED ASPIRATION OF THE RIGHT FEMORAL TROCHANTERIC FLUID COLLECTION MEDICATIONS: The patient is currently admitted to the hospital and receiving intravenous antibiotics. The antibiotics were administered within an appropriate time frame prior to the initiation of the procedure.  ANESTHESIA/SEDATION: 1.5 mg IV Versed 75 mcg IV Fentanyl Moderate Sedation Time:  42 minutes The patient was continuously monitored during the procedure by the interventional radiology nurse under my direct supervision. COMPLICATIONS: None immediate. TECHNIQUE: Informed written consent was obtained from the patient after a thorough discussion of the procedural risks, benefits and alternatives. All questions were addressed. Maximal Sterile Barrier Technique was utilized including caps, mask, sterile gowns, sterile gloves, sterile drape, hand hygiene and skin antiseptic. A timeout was performed prior to the initiation of the procedure. PROCEDURE: Patient was placed prone. Images through the lower pelvis were obtained. Skin was marked over the right ischial and right trochanteric fluid collections. Right ischial region was prepped and draped in a sterile fashion. Skin was anesthetized with 1% lidocaine. 18 gauge trocar needle was directed into this collection with CT guidance and bloody fluid was aspirated. A stiff Amplatz wire was advanced into the collection. The tract was dilated to accommodate a 10 [place] drain. 100 mL of bloody fluid was removed. No additional fluid could be aspirated. Follow up CT images were obtained at this point. Fluid sample collected for culture. The drain was left in place at this time. Attention was directed to the right trochanteric fluid collection. A new sterile field was created. Skin was anesthetized with 1% lidocaine. 19 gauge Yueh catheter was directed into the right trochanteric fluid collection. 70 mL of bloody fluid was removed. Follow up CT images were obtained and confirmed decompression of the right trochanteric fluid collection. This Yueh catheter was removed. Fluid sample sent for culture. Attention was directed back to the 10 [place] drain. No additional fluid could be removed. Therefore, the drain was cut and completely removed. Bandages placed at both puncture sites.  FINDINGS: Heterogeneous collection in the right inferior gluteal region and adjacent to the right ischial tuberosity. The collection looks like a hematoma on these non contrast images. The low-density component of the collection was successfully drained following placement of the 10 French drain. Findings are most compatible with a hematoma. The drain was removed in order to decrease the risk of hematoma contamination or infection. Bloody fluid was removed from the right femoral trochanteric fluid collection. Majority of the right trochanteric fluid was removed. IMPRESSION: CT-guided drainage of the right trochanteric and right gluteal fluid collections. Findings are most compatible with hematomas. Samples from both collections were sent for culture. 10 French drain was placed within the right ischial/gluteal collection. However, this drain was removed at the end of the procedure in order to prevent hematoma contamination or infection. Electronically Signed   By: Richarda Overlie M.D.   On: 04/17/2017 16:08    Echo 3/26 Study Conclusions  - Left ventricle: The cavity size was normal. Wall thickness was   normal. Systolic function was normal. The estimated ejection   fraction was in the range of 50% to 55%. Wall motion was normal;   there were no regional wall motion abnormalities. -  Aortic valve: There was mild regurgitation. - Left atrium: The atrium was moderately dilated. - Pulmonary arteries: Systolic pressure was mildly increased. PA   peak pressure: 40 mm Hg (S).  Impressions:  - Normal LV systolic function; mild AI; moderate LAE; mild TR; mild   pulmonary hypertension.     Discharge Exam: Vitals:   04/22/17 0800 04/22/17 0807  BP:  (!) 152/62  Pulse:  80  Resp:  13  Temp: 97.9 F (36.6 C)   SpO2:  99%    General: Pt is alert, awake, not in acute distress Cardiovascular: Irreg rhythm, rate 70s, S1/S2 +, no rubs, no gallops Respiratory: CTA bilaterally, no wheezing, no  rhonchi Abdominal: Soft, NT, ND, bowel sounds + Extremities: no edema, no cyanosis    The results of significant diagnostics from this hospitalization (including imaging, microbiology, ancillary and laboratory) are listed below for reference.     Microbiology: Recent Results (from the past 240 hour(s))  Blood Culture (routine x 2)     Status: Abnormal   Collection Time: 04/17/17  6:34 AM  Result Value Ref Range Status   Specimen Description   Final    BLOOD LEFT ANTECUBITAL Performed at [facility], [address], [place], [place] [address]    Special Requests   Final    BOTTLES DRAWN AEROBIC AND ANAEROBIC Blood Culture adequate volume Performed at [facility], [address] [place], [place] [address]    Culture  Setup Time   Final    GRAM POSITIVE COCCI IN CHAINS IN BOTH AEROBIC AND ANAEROBIC BOTTLES CRITICAL VALUE NOTED.  VALUE IS CONSISTENT WITH PREVIOUSLY REPORTED AND CALLED VALUE.    Culture (A)  Final    GROUP B STREP(S.AGALACTIAE)ISOLATED SUSCEPTIBILITIES PERFORMED ON PREVIOUS CULTURE WITHIN THE LAST 5 DAYS. Performed at [facility], [address] [place], [place] [address]    Report Status 04/19/2017 FINAL  Final  Blood Culture (routine x 2)     Status: Abnormal   Collection Time: 04/17/17  6:34 AM  Result Value Ref Range Status   Specimen Description   Final    BLOOD RIGHT ANTECUBITAL Performed at [facility], [address] [place], [place] [address]    Special Requests   Final    BOTTLES DRAWN AEROBIC AND ANAEROBIC Blood Culture adequate volume Performed at [facility], [address], [place], [place] [address]    Culture  Setup Time   Final    GRAM POSITIVE COCCI IN CHAINS IN BOTH AEROBIC AND ANAEROBIC BOTTLES CRITICAL RESULT CALLED TO, READ BACK BY AND VERIFIED WITH: B.GREEN,PHARMD AT 2236 ON 04/17/17 BY G.MCADOO Performed at [facility], [address] [place], [place] [address]    Culture GROUP B STREP(S.AGALACTIAE)ISOLATED (A)  Final   Report Status 04/19/2017 FINAL  Final   Organism ID, Bacteria GROUP B STREP(S.AGALACTIAE)ISOLATED  Final      Susceptibility   Group b strep(s.agalactiae)isolated - MIC*    CLINDAMYCIN >=1 RESISTANT Resistant     AMPICILLIN <=0.25 SENSITIVE Sensitive     ERYTHROMYCIN >=8 RESISTANT Resistant     VANCOMYCIN 0.25 SENSITIVE Sensitive     CEFTRIAXONE <=0.12 SENSITIVE Sensitive     LEVOFLOXACIN 1 SENSITIVE Sensitive     * GROUP B STREP(S.AGALACTIAE)ISOLATED  Blood Culture ID Panel (Reflexed)     Status: Abnormal   Collection Time: 04/17/17  6:34 AM  Result Value Ref Range Status   Enterococcus species NOT DETECTED NOT  DETECTED Final   Listeria monocytogenes NOT DETECTED NOT DETECTED Final   Staphylococcus species NOT DETECTED NOT DETECTED Final   Staphylococcus aureus NOT DETECTED NOT DETECTED Final   Streptococcus species DETECTED (A) NOT DETECTED Final    Comment: CRITICAL RESULT CALLED TO, READ BACK BY AND VERIFIED WITH: B.GREEN,PHARMD AT 2236 ON 04/17/17 BY G.MCADOO    Streptococcus agalactiae DETECTED (A) NOT DETECTED Final    Comment: CRITICAL RESULT CALLED TO, READ BACK BY AND VERIFIED WITH: B.GREEN,PHARMD AT 2236 ON 04/17/17 BY G.MCADOO    Streptococcus pneumoniae NOT DETECTED NOT DETECTED Final   Streptococcus pyogenes NOT DETECTED NOT DETECTED Final   Acinetobacter baumannii NOT DETECTED NOT DETECTED Final   Enterobacteriaceae species NOT DETECTED NOT DETECTED Final   Enterobacter cloacae complex NOT DETECTED NOT DETECTED Final   Escherichia coli NOT DETECTED NOT DETECTED Final   Klebsiella oxytoca NOT DETECTED NOT DETECTED Final   Klebsiella pneumoniae NOT DETECTED NOT DETECTED Final   Proteus species NOT DETECTED NOT DETECTED Final   Serratia marcescens NOT DETECTED NOT DETECTED Final   Haemophilus influenzae NOT DETECTED NOT DETECTED Final   Neisseria meningitidis NOT DETECTED NOT  DETECTED Final   Pseudomonas aeruginosa NOT DETECTED NOT DETECTED Final   Candida albicans NOT DETECTED NOT DETECTED Final   Candida glabrata NOT DETECTED NOT DETECTED Final   Candida krusei NOT DETECTED NOT DETECTED Final   Candida parapsilosis NOT DETECTED NOT DETECTED Final   Candida tropicalis NOT DETECTED NOT DETECTED Final    Comment: Performed at [facility], [address] [place], [place] [address]  Urine culture     Status: Abnormal   Collection Time: 04/17/17  6:49 AM  Result Value Ref Range Status   Specimen Description   Final    URINE, CATHETERIZED Performed at [facility], [address] [place], [place] [address]    Special Requests   Final    NONE Performed at [facility], [address] [place], [place] [address]    Culture 80,000 COLONIES/mL ESCHERICHIA COLI (A)  Final   Report Status 04/19/2017 FINAL  Final   Organism ID, Bacteria ESCHERICHIA COLI (A)  Final      Susceptibility   Escherichia coli - MIC*    AMPICILLIN 8 SENSITIVE Sensitive     CEFAZOLIN <=4 SENSITIVE Sensitive     CEFTRIAXONE <=1 SENSITIVE Sensitive     CIPROFLOXACIN <=0.25 SENSITIVE Sensitive     GENTAMICIN <=1 SENSITIVE Sensitive     IMIPENEM <=0.25 SENSITIVE Sensitive     NITROFURANTOIN <=16 SENSITIVE Sensitive     TRIMETH/SULFA <=20 SENSITIVE Sensitive     AMPICILLIN/SULBACTAM 4 SENSITIVE Sensitive     PIP/TAZO <=4 SENSITIVE Sensitive     Extended ESBL NEGATIVE Sensitive     * 80,000 COLONIES/mL ESCHERICHIA COLI  MRSA PCR Screening     Status: None   Collection Time: 04/17/17 12:56 PM  Result Value Ref Range Status   MRSA by PCR NEGATIVE NEGATIVE Final    Comment:        The GeneXpert MRSA Assay (FDA approved for NASAL specimens only), is one component of a comprehensive MRSA colonization surveillance program. It is not intended to diagnose MRSA infection nor to guide or monitor treatment for MRSA infections. Performed at [facility], [address], [place], [place] [address]   Aerobic/Anaerobic Culture (surgical/deep wound)     Status: None   Collection Time: 04/17/17  3:04 PM  Result Value Ref Range  Status   Specimen Description WOUND  Final   Special Requests   Final    FLUID FROM RIGHT GLUTEAL/ISCHIAL COLLECTION. Performed at [facility], [address] [place], [place] [address]    Gram Stain   Final    MODERATE WBC PRESENT,BOTH PMN AND MONONUCLEAR NO ORGANISMS SEEN    Culture   Final    No growth aerobically or anaerobically. Performed at [facility], [address], [place], [place] [address]    Report Status 04/22/2017 FINAL  Final  Aerobic/Anaerobic Culture (surgical/deep wound)     Status: None (Preliminary result)   Collection Time: 04/17/17  3:06 PM  Result Value Ref Range Status   Specimen Description WOUND  Final   Special Requests   Final    FLUID FROM RIGHT TROCHANTERIC HEMATOMA. Performed at [facility], [address] [place], [place] [address]    Gram Stain   Final    MODERATE WBC PRESENT,BOTH PMN AND MONONUCLEAR NO ORGANISMS SEEN    Culture   Final    NO GROWTH 4 DAYS NO ANAEROBES ISOLATED; CULTURE IN PROGRESS FOR 5 DAYS Performed at [facility], [address] [place], [place] [address]    Report Status PENDING  Incomplete  Culture, blood (routine x 2)     Status: None (Preliminary result)   Collection Time: 04/19/17  1:58 PM  Result Value Ref Range Status   Specimen Description   Final    BLOOD RIGHT ANTECUBITAL Performed at [facility], [address] [place], [place] [address]    Special Requests   Final    BOTTLES DRAWN AEROBIC AND ANAEROBIC Blood Culture adequate volume Performed at [facility], [address] [place], [place] [address]    Culture   Final    NO GROWTH 2 DAYS Performed at [facility], [address] [place], [place]  [address]    Report Status PENDING  Incomplete  Culture, blood (routine x 2)     Status: None (Preliminary result)   Collection Time: 04/19/17  2:00 PM  Result Value Ref Range Status   Specimen Description   Final    BLOOD RIGHT HAND Performed at [facility], [address], [place], [place] [address]    Special Requests   Final    BOTTLES DRAWN AEROBIC ONLY Blood Culture adequate volume Performed at [facility], [address], [place], [place] [address]    Culture   Final    NO GROWTH 2 DAYS Performed at [facility], [address] [place], [place] [address]    Report Status PENDING  Incomplete     Labs: BNP (last 3 results) No results for input(s): BNP in the last 8760 hours. Basic Metabolic Panel: Recent Labs  Lab 04/18/17 0341 04/19/17 0310 04/20/17 0257 04/21/17 0332 04/22/17 0250  NA 140 139 137 139 141  K 3.7 3.8 3.5 3.3* 3.5  CL 110 108 109 109 111  CO2 21* 21* 20* 22 24  GLUCOSE 81 78 81 83 90  BUN 15 20 18 13 10   CREATININE 0.78 0.81 0.65 0.52 0.46  CALCIUM 8.6* 8.8* 8.5* 8.6* 8.6*  MG 1.5* 1.8  --   --   --    Liver Function Tests: Recent Labs  Lab 04/17/17 0634 04/18/17 0341  AST 21 16  ALT 10* 11*  ALKPHOS 119 91  BILITOT 0.9 0.6  PROT 6.5  5.3*  ALBUMIN 2.8* 2.2*   No results for input(s): LIPASE, AMYLASE in the last 168 hours. No results for input(s): AMMONIA in the last 168 hours. CBC: Recent Labs  Lab 04/17/17 0634 04/18/17 0341 04/20/17 0257 04/21/17 0332 04/22/17 0250  WBC 22.9* 15.7* 10.3 10.7* 7.8  NEUTROABS 21.4*  --   --   --   --   HGB 10.5* 8.7* 8.8* 9.4* 8.2*  HCT 31.4* 27.0* 27.4* 29.0* 25.6*  MCV 86.5 89.1 87.8 86.8 87.1  PLT 314 288 237 293 333   Cardiac Enzymes: No results for input(s): CKTOTAL, CKMB, CKMBINDEX, TROPONINI in the last 168 hours. BNP: Invalid input(s): POCBNP CBG: Recent Labs  Lab 04/21/17 0843 04/21/17 1148 04/21/17 1638 04/21/17 2107  04/22/17 0758  GLUCAP 77 107* 114* 115* 68   D-Dimer No results for input(s): DDIMER in the last 72 hours. Hgb A1c No results for input(s): HGBA1C in the last 72 hours. Lipid Profile No results for input(s): CHOL, HDL, LDLCALC, TRIG, CHOLHDL, LDLDIRECT in the last 72 hours. Thyroid function studies No results for input(s): TSH, T4TOTAL, T3FREE, THYROIDAB in the last 72 hours.  Invalid input(s): FREET3 Anemia work up No results for input(s): VITAMINB12, FOLATE, FERRITIN, TIBC, IRON, RETICCTPCT in the last 72 hours. Urinalysis    Component Value Date/Time   COLORURINE YELLOW 04/17/2017 0649   APPEARANCEUR CLOUDY (A) 04/17/2017 0649   LABSPEC 1.015 04/17/2017 0649   PHURINE 5.0 04/17/2017 0649   GLUCOSEU NEGATIVE 04/17/2017 0649   HGBUR MODERATE (A) 04/17/2017 0649   BILIRUBINUR NEGATIVE 04/17/2017 0649   KETONESUR NEGATIVE 04/17/2017 0649   PROTEINUR 30 (A) 04/17/2017 0649   UROBILINOGEN 0.2 09/14/2014 1637   NITRITE NEGATIVE 04/17/2017 0649   LEUKOCYTESUR LARGE (A) 04/17/2017 0649   Sepsis Labs Invalid input(s): PROCALCITONIN,  WBC,  LACTICIDVEN Microbiology Recent Results (from the past 240 hour(s))  Blood Culture (routine x 2)     Status: Abnormal   Collection Time: 04/17/17  6:34 AM  Result Value Ref Range Status   Specimen Description   Final    BLOOD LEFT ANTECUBITAL Performed at [facility], [address], [place], [place] [address]    Special Requests   Final    BOTTLES DRAWN AEROBIC AND ANAEROBIC Blood Culture adequate volume Performed at [facility], [address], [place], [place] [address]    Culture  Setup Time   Final    GRAM POSITIVE COCCI IN CHAINS IN BOTH AEROBIC AND ANAEROBIC BOTTLES CRITICAL VALUE NOTED.  VALUE IS CONSISTENT WITH PREVIOUSLY REPORTED AND CALLED VALUE.    Culture (A)  Final    GROUP B STREP(S.AGALACTIAE)ISOLATED SUSCEPTIBILITIES PERFORMED ON PREVIOUS CULTURE WITHIN THE LAST 5  DAYS. Performed at [facility], [address] [place], [place] [address]    Report Status 04/19/2017 FINAL  Final  Blood Culture (routine x 2)     Status: Abnormal   Collection Time: 04/17/17  6:34 AM  Result Value Ref Range Status   Specimen Description   Final    BLOOD RIGHT ANTECUBITAL Performed at [facility], [address] [place], [place] [address]    Special Requests   Final    BOTTLES DRAWN AEROBIC AND ANAEROBIC Blood Culture adequate volume Performed at [facility], [address], [place], [place] [address]    Culture  Setup Time   Final    GRAM POSITIVE COCCI IN CHAINS IN BOTH AEROBIC AND ANAEROBIC BOTTLES CRITICAL RESULT CALLED TO,  READ BACK BY AND VERIFIED WITH: B.GREEN,PHARMD AT 2236 ON 04/17/17 BY G.MCADOO Performed at [facility], [address], [place], [place] [address]    Culture GROUP B STREP(S.AGALACTIAE)ISOLATED (A)  Final   Report Status 04/19/2017 FINAL  Final   Organism ID, Bacteria GROUP B STREP(S.AGALACTIAE)ISOLATED  Final      Susceptibility   Group b strep(s.agalactiae)isolated - MIC*    CLINDAMYCIN >=1 RESISTANT Resistant     AMPICILLIN <=0.25 SENSITIVE Sensitive     ERYTHROMYCIN >=8 RESISTANT Resistant     VANCOMYCIN 0.25 SENSITIVE Sensitive     CEFTRIAXONE <=0.12 SENSITIVE Sensitive     LEVOFLOXACIN 1 SENSITIVE Sensitive     * GROUP B STREP(S.AGALACTIAE)ISOLATED  Blood Culture ID Panel (Reflexed)     Status: Abnormal   Collection Time: 04/17/17  6:34 AM  Result Value Ref Range Status   Enterococcus species NOT DETECTED NOT DETECTED Final   Listeria monocytogenes NOT DETECTED NOT DETECTED Final   Staphylococcus species NOT DETECTED NOT DETECTED Final   Staphylococcus aureus NOT DETECTED NOT DETECTED Final   Streptococcus species DETECTED (A) NOT DETECTED Final    Comment: CRITICAL RESULT CALLED TO, READ BACK BY AND VERIFIED WITH: B.GREEN,PHARMD AT 2236 ON 04/17/17 BY G.MCADOO     Streptococcus agalactiae DETECTED (A) NOT DETECTED Final    Comment: CRITICAL RESULT CALLED TO, READ BACK BY AND VERIFIED WITH: B.GREEN,PHARMD AT 2236 ON 04/17/17 BY G.MCADOO    Streptococcus pneumoniae NOT DETECTED NOT DETECTED Final   Streptococcus pyogenes NOT DETECTED NOT DETECTED Final   Acinetobacter baumannii NOT DETECTED NOT DETECTED Final   Enterobacteriaceae species NOT DETECTED NOT DETECTED Final   Enterobacter cloacae complex NOT DETECTED NOT DETECTED Final   Escherichia coli NOT DETECTED NOT DETECTED Final   Klebsiella oxytoca NOT DETECTED NOT DETECTED Final   Klebsiella pneumoniae NOT DETECTED NOT DETECTED Final   Proteus species NOT DETECTED NOT DETECTED Final   Serratia marcescens NOT DETECTED NOT DETECTED Final   Haemophilus influenzae NOT DETECTED NOT DETECTED Final   Neisseria meningitidis NOT DETECTED NOT DETECTED Final   Pseudomonas aeruginosa NOT DETECTED NOT DETECTED Final   Candida albicans NOT DETECTED NOT DETECTED Final   Candida glabrata NOT DETECTED NOT DETECTED Final   Candida krusei NOT DETECTED NOT DETECTED Final   Candida parapsilosis NOT DETECTED NOT DETECTED Final   Candida tropicalis NOT DETECTED NOT DETECTED Final    Comment: Performed at [facility], [address] [place], [place] [address]  Urine culture     Status: Abnormal   Collection Time: 04/17/17  6:49 AM  Result Value Ref Range Status   Specimen Description   Final    URINE, CATHETERIZED Performed at [facility], [address] [place], [place] [address]    Special Requests   Final    NONE Performed at [facility], [address] [place], [place] [address]    Culture 80,000 COLONIES/mL ESCHERICHIA COLI (A)  Final   Report Status 04/19/2017 FINAL  Final   Organism ID, Bacteria ESCHERICHIA COLI (A)  Final      Susceptibility   Escherichia coli - MIC*    AMPICILLIN 8 SENSITIVE Sensitive     CEFAZOLIN <=4 SENSITIVE Sensitive      CEFTRIAXONE <=1 SENSITIVE Sensitive     CIPROFLOXACIN <=0.25 SENSITIVE Sensitive     GENTAMICIN <=1 SENSITIVE Sensitive     IMIPENEM <=0.25 SENSITIVE Sensitive     NITROFURANTOIN <=16 SENSITIVE Sensitive  TRIMETH/SULFA <=20 SENSITIVE Sensitive     AMPICILLIN/SULBACTAM 4 SENSITIVE Sensitive     PIP/TAZO <=4 SENSITIVE Sensitive     Extended ESBL NEGATIVE Sensitive     * 80,000 COLONIES/mL ESCHERICHIA COLI  MRSA PCR Screening     Status: None   Collection Time: 04/17/17 12:56 PM  Result Value Ref Range Status   MRSA by PCR NEGATIVE NEGATIVE Final    Comment:        The GeneXpert MRSA Assay (FDA approved for NASAL specimens only), is one component of a comprehensive MRSA colonization surveillance program. It is not intended to diagnose MRSA infection nor to guide or monitor treatment for MRSA infections. Performed at [facility], [address] [place], [place] [address]   Aerobic/Anaerobic Culture (surgical/deep wound)     Status: None   Collection Time: 04/17/17  3:04 PM  Result Value Ref Range Status   Specimen Description WOUND  Final   Special Requests   Final    FLUID FROM RIGHT GLUTEAL/ISCHIAL COLLECTION. Performed at [facility], [address] [place], [place] [address]    Gram Stain   Final    MODERATE WBC PRESENT,BOTH PMN AND MONONUCLEAR NO ORGANISMS SEEN    Culture   Final    No growth aerobically or anaerobically. Performed at [facility], [address], [place], [place] [address]    Report Status 04/22/2017 FINAL  Final  Aerobic/Anaerobic Culture (surgical/deep wound)     Status: None (Preliminary result)   Collection Time: 04/17/17  3:06 PM  Result Value Ref Range Status   Specimen Description WOUND  Final   Special Requests   Final    FLUID FROM RIGHT TROCHANTERIC HEMATOMA. Performed at [facility], [address] [place], [place] [address]    Gram Stain   Final    MODERATE WBC  PRESENT,BOTH PMN AND MONONUCLEAR NO ORGANISMS SEEN    Culture   Final    NO GROWTH 4 DAYS NO ANAEROBES ISOLATED; CULTURE IN PROGRESS FOR 5 DAYS Performed at [facility], [address], [place], [place] [address]    Report Status PENDING  Incomplete  Culture, blood (routine x 2)     Status: None (Preliminary result)   Collection Time: 04/19/17  1:58 PM  Result Value Ref Range Status   Specimen Description   Final    BLOOD RIGHT ANTECUBITAL Performed at [facility], [address] [place], [place] [address]    Special Requests   Final    BOTTLES DRAWN AEROBIC AND ANAEROBIC Blood Culture adequate volume Performed at [facility], [address], [place], [place] [address]    Culture   Final    NO GROWTH 2 DAYS Performed at [facility], [address], [place], [place] [address]    Report Status PENDING  Incomplete  Culture, blood (routine x 2)     Status: None (Preliminary result)   Collection Time: 04/19/17  2:00 PM  Result Value Ref Range Status   Specimen Description   Final    BLOOD RIGHT HAND Performed at [facility], [address], [place], [place] [address]    Special Requests   Final    BOTTLES DRAWN AEROBIC ONLY Blood Culture adequate volume Performed at [facility], [address], [place], [place] [address]    Culture   Final    NO GROWTH 2 DAYS Performed at [facility], [address] [place], [place] [address]    Report Status PENDING  Incomplete     Patient was seen and examined on the day of discharge and was found to be in stable condition. Time coordinating discharge: 35 minutes including assessment and coordination of care, as well as examination of the patient.   SIGNED:  Noralee Stain, DO [facility] Pager [phone]  If 7PM-7AM, please contact night-coverage [email or web address] Password [email or web address] 04/22/2017, 1:18 PM

## 2017-04-22 NOTE — Progress Notes (Signed)
[facility]   Reason for visit: Follow up on bacteremia  Interval History: repeat blood cultures continue to remain negative; fluid from hematoma with no growth to date; pain better controlled today, worried about getting enough pain control at SNF.   Physical Exam: Constitutional:  Vitals:   04/22/17 0800 04/22/17 0807  BP:  (!) 152/62  Pulse:  80  Resp:  13  Temp: 97.9 F (36.6 C)   SpO2:  99%   patient appears in NAD Respiratory: Normal respiratory effort; CTA B Cardiovascular: RRR GI: soft, nt, nd Skin: no rashes  Review of Systems: Constitutional: negative for fevers and chills Gastrointestinal: negative for diarrhea Integument/breast: negative for rash  Lab Results  Component Value Date   WBC 7.8 04/22/2017   HGB 8.2 (L) 04/22/2017   HCT 25.6 (L) 04/22/2017   MCV 87.1 04/22/2017   PLT 333 04/22/2017    Lab Results  Component Value Date   CREATININE 0.46 04/22/2017   BUN 10 04/22/2017   NA 141 04/22/2017   K 3.5 04/22/2017   CL 111 04/22/2017   CO2 24 04/22/2017    Lab Results  Component Value Date   ALT 11 (L) 04/18/2017   AST 16 04/18/2017   ALKPHOS 91 04/18/2017     Microbiology: Recent Results (from the past 240 hour(s))  Blood Culture (routine x 2)     Status: Abnormal   Collection Time: 04/17/17  6:34 AM  Result Value Ref Range Status   Specimen Description   Final    BLOOD LEFT ANTECUBITAL Performed at [facility], [place] [address], [place], [place] [address]    Special Requests   Final    BOTTLES DRAWN AEROBIC AND ANAEROBIC Blood Culture adequate volume Performed at [facility] [address] [place], [place] [address]    Culture  Setup Time   Final    GRAM POSITIVE COCCI IN CHAINS IN BOTH AEROBIC AND ANAEROBIC BOTTLES CRITICAL VALUE NOTED.  VALUE IS CONSISTENT WITH PREVIOUSLY REPORTED AND CALLED VALUE.    Culture (A)  Final    GROUP B  STREP(S.AGALACTIAE)ISOLATED SUSCEPTIBILITIES PERFORMED ON PREVIOUS CULTURE WITHIN THE LAST 5 DAYS. Performed at [facility], [place] [address] [place], [place] [address]    Report Status 04/19/2017 FINAL  Final  Blood Culture (routine x 2)     Status: Abnormal   Collection Time: 04/17/17  6:34 AM  Result Value Ref Range Status   Specimen Description   Final    BLOOD RIGHT ANTECUBITAL Performed at [facility] [address], [place], [place] [address]    Special Requests   Final    BOTTLES DRAWN AEROBIC AND ANAEROBIC Blood Culture adequate volume Performed at [facility] [address] [place], [place] [address]    Culture  Setup Time   Final    GRAM POSITIVE COCCI IN CHAINS IN BOTH AEROBIC AND ANAEROBIC BOTTLES CRITICAL RESULT CALLED TO, READ BACK BY AND VERIFIED WITH: B.GREEN,PHARMD AT 2236 ON 04/17/17 BY G.MCADOO Performed at [facility], [place] [address], [place], [place] [address]    Culture GROUP B STREP(S.AGALACTIAE)ISOLATED (A)  Final   Report Status 04/19/2017 FINAL  Final   Organism ID, Bacteria GROUP B STREP(S.AGALACTIAE)ISOLATED  Final      Susceptibility   Group b strep(s.agalactiae)isolated - MIC*    CLINDAMYCIN >=1 RESISTANT Resistant     AMPICILLIN <=0.25 SENSITIVE Sensitive     ERYTHROMYCIN >=  8 RESISTANT Resistant     VANCOMYCIN 0.25 SENSITIVE Sensitive     CEFTRIAXONE <=0.12 SENSITIVE Sensitive     LEVOFLOXACIN 1 SENSITIVE Sensitive     * GROUP B STREP(S.AGALACTIAE)ISOLATED  Blood Culture ID Panel (Reflexed)     Status: Abnormal   Collection Time: 04/17/17  6:34 AM  Result Value Ref Range Status   Enterococcus species NOT DETECTED NOT DETECTED Final   Listeria monocytogenes NOT DETECTED NOT DETECTED Final   Staphylococcus species NOT DETECTED NOT DETECTED Final   Staphylococcus aureus NOT DETECTED NOT DETECTED Final   Streptococcus species DETECTED (A) NOT DETECTED Final    Comment: CRITICAL RESULT CALLED  TO, READ BACK BY AND VERIFIED WITH: B.GREEN,PHARMD AT 2236 ON 04/17/17 BY G.MCADOO    Streptococcus agalactiae DETECTED (A) NOT DETECTED Final    Comment: CRITICAL RESULT CALLED TO, READ BACK BY AND VERIFIED WITH: B.GREEN,PHARMD AT 2236 ON 04/17/17 BY G.MCADOO    Streptococcus pneumoniae NOT DETECTED NOT DETECTED Final   Streptococcus pyogenes NOT DETECTED NOT DETECTED Final   Acinetobacter baumannii NOT DETECTED NOT DETECTED Final   Enterobacteriaceae species NOT DETECTED NOT DETECTED Final   Enterobacter cloacae complex NOT DETECTED NOT DETECTED Final   Escherichia coli NOT DETECTED NOT DETECTED Final   Klebsiella oxytoca NOT DETECTED NOT DETECTED Final   Klebsiella pneumoniae NOT DETECTED NOT DETECTED Final   Proteus species NOT DETECTED NOT DETECTED Final   Serratia marcescens NOT DETECTED NOT DETECTED Final   Haemophilus influenzae NOT DETECTED NOT DETECTED Final   Neisseria meningitidis NOT DETECTED NOT DETECTED Final   Pseudomonas aeruginosa NOT DETECTED NOT DETECTED Final   Candida albicans NOT DETECTED NOT DETECTED Final   Candida glabrata NOT DETECTED NOT DETECTED Final   Candida krusei NOT DETECTED NOT DETECTED Final   Candida parapsilosis NOT DETECTED NOT DETECTED Final   Candida tropicalis NOT DETECTED NOT DETECTED Final    Comment: Performed at [facility], [place] [address], [place], [place] [address]  Urine culture     Status: Abnormal   Collection Time: 04/17/17  6:49 AM  Result Value Ref Range Status   Specimen Description   Final    URINE, CATHETERIZED Performed at [facility] [address], [place], [place] [address]    Special Requests   Final    NONE Performed at [facility], [place] [address], [place], [place] [address]    Culture 80,000 COLONIES/mL ESCHERICHIA COLI (A)  Final   Report Status 04/19/2017 FINAL  Final   Organism ID, Bacteria ESCHERICHIA COLI (A)  Final      Susceptibility   Escherichia coli - MIC*     AMPICILLIN 8 SENSITIVE Sensitive     CEFAZOLIN <=4 SENSITIVE Sensitive     CEFTRIAXONE <=1 SENSITIVE Sensitive     CIPROFLOXACIN <=0.25 SENSITIVE Sensitive     GENTAMICIN <=1 SENSITIVE Sensitive     IMIPENEM <=0.25 SENSITIVE Sensitive     NITROFURANTOIN <=16 SENSITIVE Sensitive     TRIMETH/SULFA <=20 SENSITIVE Sensitive     AMPICILLIN/SULBACTAM 4 SENSITIVE Sensitive     PIP/TAZO <=4 SENSITIVE Sensitive     Extended ESBL NEGATIVE Sensitive     * 80,000 COLONIES/mL ESCHERICHIA COLI  MRSA PCR Screening     Status: None   Collection Time: 04/17/17 12:56 PM  Result Value Ref Range Status   MRSA by PCR NEGATIVE NEGATIVE Final    Comment:        The GeneXpert MRSA Assay (FDA approved for  NASAL specimens only), is one component of a comprehensive MRSA colonization surveillance program. It is not intended to diagnose MRSA infection nor to guide or monitor treatment for MRSA infections. Performed at [facility], [place] [address], [place], [place] [address]   Aerobic/Anaerobic Culture (surgical/deep wound)     Status: None (Preliminary result)   Collection Time: 04/17/17  3:04 PM  Result Value Ref Range Status   Specimen Description WOUND  Final   Special Requests   Final    FLUID FROM RIGHT GLUTEAL/ISCHIAL COLLECTION. Performed at [facility], [place] [address] [place], [place] [address]    Gram Stain   Final    MODERATE WBC PRESENT,BOTH PMN AND MONONUCLEAR NO ORGANISMS SEEN    Culture   Final    NO GROWTH 4 DAYS NO ANAEROBES ISOLATED; CULTURE IN PROGRESS FOR 5 DAYS Performed at [facility], [place] [address] [place], [place] [address]    Report Status PENDING  Incomplete  Aerobic/Anaerobic Culture (surgical/deep wound)     Status: None (Preliminary result)   Collection Time: 04/17/17  3:06 PM  Result Value Ref Range Status   Specimen Description WOUND  Final   Special Requests   Final    FLUID FROM RIGHT TROCHANTERIC  HEMATOMA. Performed at [facility], [place] [address] [place], [place] [address]    Gram Stain   Final    MODERATE WBC PRESENT,BOTH PMN AND MONONUCLEAR NO ORGANISMS SEEN    Culture   Final    NO GROWTH 4 DAYS NO ANAEROBES ISOLATED; CULTURE IN PROGRESS FOR 5 DAYS Performed at [facility], [place] [address], [place], [place] [address]    Report Status PENDING  Incomplete  Culture, blood (routine x 2)     Status: None (Preliminary result)   Collection Time: 04/19/17  1:58 PM  Result Value Ref Range Status   Specimen Description   Final    BLOOD RIGHT ANTECUBITAL Performed at [facility] [address] [place], [place] [address]    Special Requests   Final    BOTTLES DRAWN AEROBIC AND ANAEROBIC Blood Culture adequate volume Performed at [facility] [address] [place], [place] [address]    Culture   Final    NO GROWTH 2 DAYS Performed at [facility] [address] [place], [place] [address]    Report Status PENDING  Incomplete  Culture, blood (routine x 2)     Status: None (Preliminary result)   Collection Time: 04/19/17  2:00 PM  Result Value Ref Range Status   Specimen Description   Final    BLOOD RIGHT HAND Performed at [facility] [address], [place], [place] [address]    Special Requests   Final    BOTTLES DRAWN AEROBIC ONLY Blood Culture adequate volume Performed at [facility] [address] [place], [place] [address]    Culture   Final    NO GROWTH 2 DAYS Performed at [facility] [address] [place], [place] [address]    Report Status PENDING  Incomplete    Impression/Plan:  1. Osteomyelitis - c/w osteomyelitis based on CT findings, pain, fluid and bacteremia, ESR > 100.  She is covered with levaquin and oral levaquin at discharge is good at 750 mg daily.  She will need a prolonged course for 6 weeks through May 2nd She will need  monitoring with a weekly BMP  while on levaquin    2.  GBS bacteremia - repeat cultures negative so far.    3.   Antibiotic allergy - tolerating levaquin at this time.    Will arrange outpatient follow up I will sign off. thanks

## 2017-04-24 LAB — CULTURE, BLOOD (ROUTINE X 2)
Culture: NO GROWTH
Culture: NO GROWTH
Special Requests: ADEQUATE
Special Requests: ADEQUATE

## 2017-04-29 ENCOUNTER — Encounter (HOSPITAL_COMMUNITY): Payer: Self-pay

## 2017-04-29 ENCOUNTER — Emergency Department (HOSPITAL_COMMUNITY): Payer: Medicare Other

## 2017-04-29 ENCOUNTER — Inpatient Hospital Stay (HOSPITAL_COMMUNITY)
Admission: EM | Admit: 2017-04-29 | Discharge: 2017-05-04 | DRG: 872 | Disposition: A | Discharge status: Skilled Nursing Facility | Payer: Medicare Other | Attending: Family Medicine | Admitting: Family Medicine

## 2017-04-29 ENCOUNTER — Other Ambulatory Visit: Payer: Self-pay

## 2017-04-29 DIAGNOSIS — L4050 Arthropathic psoriasis, unspecified: Secondary | ICD-10-CM | POA: Diagnosis present

## 2017-04-29 DIAGNOSIS — G8929 Other chronic pain: Secondary | ICD-10-CM | POA: Diagnosis present

## 2017-04-29 DIAGNOSIS — K219 Gastro-esophageal reflux disease without esophagitis: Secondary | ICD-10-CM | POA: Diagnosis present

## 2017-04-29 DIAGNOSIS — Z96653 Presence of artificial knee joint, bilateral: Secondary | ICD-10-CM | POA: Diagnosis present

## 2017-04-29 DIAGNOSIS — Z9049 Acquired absence of other specified parts of digestive tract: Secondary | ICD-10-CM

## 2017-04-29 DIAGNOSIS — R296 Repeated falls: Secondary | ICD-10-CM | POA: Diagnosis present

## 2017-04-29 DIAGNOSIS — E1142 Type 2 diabetes mellitus with diabetic polyneuropathy: Secondary | ICD-10-CM | POA: Diagnosis present

## 2017-04-29 DIAGNOSIS — A419 Sepsis, unspecified organism: Principal | ICD-10-CM | POA: Diagnosis present

## 2017-04-29 DIAGNOSIS — M069 Rheumatoid arthritis, unspecified: Secondary | ICD-10-CM | POA: Diagnosis present

## 2017-04-29 DIAGNOSIS — Z981 Arthrodesis status: Secondary | ICD-10-CM

## 2017-04-29 DIAGNOSIS — M4624 Osteomyelitis of vertebra, thoracic region: Secondary | ICD-10-CM | POA: Diagnosis present

## 2017-04-29 DIAGNOSIS — R6520 Severe sepsis without septic shock: Secondary | ICD-10-CM

## 2017-04-29 DIAGNOSIS — Z888 Allergy status to other drugs, medicaments and biological substances status: Secondary | ICD-10-CM

## 2017-04-29 DIAGNOSIS — L89151 Pressure ulcer of sacral region, stage 1: Secondary | ICD-10-CM | POA: Diagnosis present

## 2017-04-29 DIAGNOSIS — Z961 Presence of intraocular lens: Secondary | ICD-10-CM | POA: Diagnosis present

## 2017-04-29 DIAGNOSIS — I9589 Other hypotension: Secondary | ICD-10-CM | POA: Diagnosis not present

## 2017-04-29 DIAGNOSIS — Z90710 Acquired absence of both cervix and uterus: Secondary | ICD-10-CM

## 2017-04-29 DIAGNOSIS — Z7952 Long term (current) use of systemic steroids: Secondary | ICD-10-CM

## 2017-04-29 DIAGNOSIS — Z452 Encounter for adjustment and management of vascular access device: Secondary | ICD-10-CM

## 2017-04-29 DIAGNOSIS — M1990 Unspecified osteoarthritis, unspecified site: Secondary | ICD-10-CM | POA: Diagnosis present

## 2017-04-29 DIAGNOSIS — Z8249 Family history of ischemic heart disease and other diseases of the circulatory system: Secondary | ICD-10-CM

## 2017-04-29 DIAGNOSIS — M810 Age-related osteoporosis without current pathological fracture: Secondary | ICD-10-CM | POA: Diagnosis present

## 2017-04-29 DIAGNOSIS — E2740 Unspecified adrenocortical insufficiency: Secondary | ICD-10-CM | POA: Diagnosis not present

## 2017-04-29 DIAGNOSIS — I480 Paroxysmal atrial fibrillation: Secondary | ICD-10-CM | POA: Diagnosis present

## 2017-04-29 DIAGNOSIS — Z880 Allergy status to penicillin: Secondary | ICD-10-CM

## 2017-04-29 DIAGNOSIS — Z9842 Cataract extraction status, left eye: Secondary | ICD-10-CM

## 2017-04-29 DIAGNOSIS — E1140 Type 2 diabetes mellitus with diabetic neuropathy, unspecified: Secondary | ICD-10-CM | POA: Diagnosis present

## 2017-04-29 DIAGNOSIS — M549 Dorsalgia, unspecified: Secondary | ICD-10-CM | POA: Diagnosis not present

## 2017-04-29 DIAGNOSIS — Z79899 Other long term (current) drug therapy: Secondary | ICD-10-CM

## 2017-04-29 DIAGNOSIS — M869 Osteomyelitis, unspecified: Secondary | ICD-10-CM

## 2017-04-29 DIAGNOSIS — I959 Hypotension, unspecified: Secondary | ICD-10-CM | POA: Diagnosis present

## 2017-04-29 DIAGNOSIS — Z7982 Long term (current) use of aspirin: Secondary | ICD-10-CM

## 2017-04-29 DIAGNOSIS — R6510 Systemic inflammatory response syndrome (SIRS) of non-infectious origin without acute organ dysfunction: Secondary | ICD-10-CM | POA: Diagnosis present

## 2017-04-29 DIAGNOSIS — L89612 Pressure ulcer of right heel, stage 2: Secondary | ICD-10-CM | POA: Diagnosis present

## 2017-04-29 DIAGNOSIS — Z889 Allergy status to unspecified drugs, medicaments and biological substances status: Secondary | ICD-10-CM

## 2017-04-29 DIAGNOSIS — Z993 Dependence on wheelchair: Secondary | ICD-10-CM

## 2017-04-29 DIAGNOSIS — Z9841 Cataract extraction status, right eye: Secondary | ICD-10-CM

## 2017-04-29 DIAGNOSIS — Z22330 Carrier of Group B streptococcus: Secondary | ICD-10-CM

## 2017-04-29 DIAGNOSIS — M4644 Discitis, unspecified, thoracic region: Secondary | ICD-10-CM | POA: Diagnosis present

## 2017-04-29 DIAGNOSIS — I10 Essential (primary) hypertension: Secondary | ICD-10-CM | POA: Diagnosis present

## 2017-04-29 DIAGNOSIS — E1169 Type 2 diabetes mellitus with other specified complication: Secondary | ICD-10-CM | POA: Diagnosis present

## 2017-04-29 DIAGNOSIS — Z87891 Personal history of nicotine dependence: Secondary | ICD-10-CM

## 2017-04-29 LAB — CBC WITH DIFFERENTIAL/PLATELET
Basophils Absolute: 0 10*3/uL (ref 0.0–0.1)
Basophils Relative: 0 %
Eosinophils Absolute: 0.3 10*3/uL (ref 0.0–0.7)
Eosinophils Relative: 2 %
HEMATOCRIT: 30.8 % — AB (ref 36.0–46.0)
HEMOGLOBIN: 9.5 g/dL — AB (ref 12.0–15.0)
LYMPHS ABS: 1.3 10*3/uL (ref 0.7–4.0)
LYMPHS PCT: 7 %
MCH: 27.9 pg (ref 26.0–34.0)
MCHC: 30.8 g/dL (ref 30.0–36.0)
MCV: 90.6 fL (ref 78.0–100.0)
Monocytes Absolute: 1.3 10*3/uL — ABNORMAL HIGH (ref 0.1–1.0)
Monocytes Relative: 7 %
NEUTROS PCT: 84 %
Neutro Abs: 15.4 10*3/uL — ABNORMAL HIGH (ref 1.7–7.7)
Platelets: 618 10*3/uL — ABNORMAL HIGH (ref 150–400)
RBC: 3.4 MIL/uL — AB (ref 3.87–5.11)
RDW: 15.7 % — ABNORMAL HIGH (ref 11.5–15.5)
WBC: 18.3 10*3/uL — AB (ref 4.0–10.5)

## 2017-04-29 LAB — I-STAT CHEM 8, ED
BUN: 17 mg/dL (ref 6–20)
CALCIUM ION: 1.29 mmol/L (ref 1.15–1.40)
Chloride: 100 mmol/L — ABNORMAL LOW (ref 101–111)
Creatinine, Ser: 0.9 mg/dL (ref 0.44–1.00)
GLUCOSE: 113 mg/dL — AB (ref 65–99)
HCT: 26 % — ABNORMAL LOW (ref 36.0–46.0)
Hemoglobin: 8.8 g/dL — ABNORMAL LOW (ref 12.0–15.0)
Potassium: 4.3 mmol/L (ref 3.5–5.1)
Sodium: 139 mmol/L (ref 135–145)
TCO2: 30 mmol/L (ref 22–32)

## 2017-04-29 LAB — COMPREHENSIVE METABOLIC PANEL
ALK PHOS: 110 U/L (ref 38–126)
ALT: 17 U/L (ref 14–54)
AST: 29 U/L (ref 15–41)
Albumin: 2.6 g/dL — ABNORMAL LOW (ref 3.5–5.0)
Anion gap: 12 (ref 5–15)
BUN: 21 mg/dL — ABNORMAL HIGH (ref 6–20)
CALCIUM: 9.7 mg/dL (ref 8.9–10.3)
CO2: 27 mmol/L (ref 22–32)
CREATININE: 0.85 mg/dL (ref 0.44–1.00)
Chloride: 99 mmol/L — ABNORMAL LOW (ref 101–111)
Glucose, Bld: 122 mg/dL — ABNORMAL HIGH (ref 65–99)
Potassium: 4.4 mmol/L (ref 3.5–5.1)
Sodium: 138 mmol/L (ref 135–145)
Total Bilirubin: 0.3 mg/dL (ref 0.3–1.2)
Total Protein: 6.1 g/dL — ABNORMAL LOW (ref 6.5–8.1)

## 2017-04-29 LAB — URINALYSIS, ROUTINE W REFLEX MICROSCOPIC
BILIRUBIN URINE: NEGATIVE
GLUCOSE, UA: NEGATIVE mg/dL
HGB URINE DIPSTICK: NEGATIVE
Ketones, ur: NEGATIVE mg/dL
Leukocytes, UA: NEGATIVE
Nitrite: NEGATIVE
Protein, ur: NEGATIVE mg/dL
Specific Gravity, Urine: 1.014 (ref 1.005–1.030)
pH: 7 (ref 5.0–8.0)

## 2017-04-29 LAB — I-STAT CG4 LACTIC ACID, ED
LACTIC ACID, VENOUS: 2.55 mmol/L — AB (ref 0.5–1.9)
Lactic Acid, Venous: 2.81 mmol/L (ref 0.5–1.9)

## 2017-04-29 LAB — C-REACTIVE PROTEIN: CRP: 5.7 mg/dL — ABNORMAL HIGH (ref <1.0)

## 2017-04-29 LAB — PROTIME-INR
INR: 1.19
Prothrombin Time: 15 seconds (ref 11.4–15.2)

## 2017-04-29 LAB — SEDIMENTATION RATE: SED RATE: 69 mm/h — AB (ref 0–22)

## 2017-04-29 MED ORDER — LACTATED RINGERS IV BOLUS
500.0000 mL | Freq: Once | INTRAVENOUS | Status: AC
  Administered 2017-04-29: 500 mL via INTRAVENOUS

## 2017-04-29 MED ORDER — SODIUM CHLORIDE 0.9 % IV BOLUS
1000.0000 mL | Freq: Once | INTRAVENOUS | Status: AC
  Administered 2017-04-29: 1000 mL via INTRAVENOUS

## 2017-04-29 MED ORDER — AZTREONAM IN DEXTROSE 2 GM/50ML IV SOLN
2.0000 g | Freq: Once | INTRAVENOUS | Status: AC
  Administered 2017-04-29: 2 g via INTRAVENOUS
  Filled 2017-04-29: qty 50

## 2017-04-29 MED ORDER — IOPAMIDOL (ISOVUE-300) INJECTION 61%
100.0000 mL | Freq: Once | INTRAVENOUS | Status: AC | PRN
  Administered 2017-04-29: 100 mL via INTRAVENOUS

## 2017-04-29 MED ORDER — VANCOMYCIN HCL IN DEXTROSE 1-5 GM/200ML-% IV SOLN
1000.0000 mg | Freq: Once | INTRAVENOUS | Status: AC
  Administered 2017-04-29: 1000 mg via INTRAVENOUS
  Filled 2017-04-29: qty 200

## 2017-04-29 MED ORDER — SODIUM CHLORIDE 0.9 % IV SOLN
Freq: Once | INTRAVENOUS | Status: AC
Start: 2017-04-29 — End: 2017-04-29
  Administered 2017-04-29: 23:00:00 via INTRAVENOUS

## 2017-04-29 MED ORDER — HYDROCORTISONE NA SUCCINATE PF 100 MG IJ SOLR
100.0000 mg | Freq: Once | INTRAMUSCULAR | Status: AC
  Administered 2017-04-29: 100 mg via INTRAVENOUS
  Filled 2017-04-29: qty 2

## 2017-04-29 MED ORDER — LEVOFLOXACIN IN D5W 750 MG/150ML IV SOLN
750.0000 mg | Freq: Once | INTRAVENOUS | Status: AC
  Administered 2017-04-29: 750 mg via INTRAVENOUS
  Filled 2017-04-29: qty 150

## 2017-04-29 MED ORDER — FENTANYL CITRATE (PF) 100 MCG/2ML IJ SOLN
50.0000 ug | Freq: Once | INTRAMUSCULAR | Status: AC
  Administered 2017-04-29: 50 ug via INTRAVENOUS
  Filled 2017-04-29: qty 2

## 2017-04-29 MED ORDER — ONDANSETRON HCL 4 MG/2ML IJ SOLN
4.0000 mg | Freq: Once | INTRAMUSCULAR | Status: AC
  Administered 2017-04-29: 4 mg via INTRAVENOUS
  Filled 2017-04-29: qty 2

## 2017-04-29 MED ORDER — IOPAMIDOL (ISOVUE-300) INJECTION 61%
INTRAVENOUS | Status: AC
  Filled 2017-04-29: qty 100

## 2017-04-29 MED ORDER — SODIUM CHLORIDE 0.9 % IJ SOLN
INTRAMUSCULAR | Status: AC
  Filled 2017-04-29: qty 50

## 2017-04-29 NOTE — ED Notes (Signed)
Py

## 2017-04-29 NOTE — ED Notes (Signed)
Pt has purewick catheter on, pt wants to try that before doing in/out cath. Pt had incontinent episode while in [place].

## 2017-04-29 NOTE — H&P (Signed)
Andrea MILLIKEN FIE:[id number] DOB: [birth year] DOA: 04/29/2017     PCP: Jarome Matin, MD   Outpatient Specialists: [facility]  Patient arrived to ER on 04/29/17 at 1557  Patient coming from:     From facility SNF [facility]  Chief Complaint:  Chief Complaint  Patient presents with  . Back Pain    HPI: Andrea Yu is a 77 y.o. 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 Not helped by Vicodin at SNF. Reports started initially at the same time as she was admitted for UTI and felt to be related at the time. But UTI was treated and improved ut back pain continued. Associated nausea chills no fevers no diarrhea have had urinary frequency no weakness in her wounds no bowel incontinence  Regarding pertinent Chronic problems: patient had a fracture in June 2018 due to fall with poor healing osteo formation  right tibial calcaneal fusion on 01/07/2016  In March was admitted with AMS and found to have sepsis like physiology NOted to have fluid collection right trochanteric bursa and right ischial tuberosity with right femoral head osteomyelitis and sepsis secondary to group B strep bacteremia.  Orthopedic surgery was consulted,sp. drainage by IR on 3/24, findings compatible with hematoma.  Culture was negative  .  Blood culture on 3/24 = group B strep.  Repeat blood culture 3/26 negative  .  Infectious disease was consulted.  Patient has anaphylactic  reactions to penicillin.  She has been recommended for oral Levaquin 750 mg for prolonged course, last date 05/26/17. 2D echo showed preserved EF no evidence of vegetation.  Urine did grow e.coli at that time.  She has long standing hx of psoriatic arthritis on chronic prednisone. And Apremilast.   Known hx of A. fib not on aticoagulation  due to risk of falls and hx of head bleed.     While in ER:  Noted to be hypotensive code sepsis initiated started on broad-spectrum IV noted to have elevated lactic acid and given stress dose steroid.started on Vancomycin and Levaquin and aztreonamStarted on  Significant initial  Findings: Abnormal Labs Reviewed  CBC WITH DIFFERENTIAL/PLATELET - Abnormal; Notable for the following components:      Result Value   WBC 18.3 (*)    RBC 3.40 (*)    Hemoglobin 9.5 (*)    HCT 30.8 (*)    RDW 15.7 (*)    Platelets 618 (*)    Neutro Abs 15.4 (*)    Monocytes Absolute 1.3 (*)    All other components within normal limits  COMPREHENSIVE METABOLIC PANEL - Abnormal; Notable for the following components:   Chloride 99 (*)    Glucose, Bld 122 (*)    BUN 21 (*)    Total Protein 6.1 (*)    Albumin 2.6 (*)    All other components within normal limits  SEDIMENTATION RATE - Abnormal; Notable for the following components:   Sed Rate 69 (*)    All other components within normal limits  C-REACTIVE PROTEIN -  Abnormal; Notable for the following components:   CRP 5.7 (*)    All other components within normal limits  I-STAT CG4 LACTIC ACID, ED - Abnormal; Notable for the following components:   Lactic Acid, Venous 2.55 (*)    All other components within normal limits  I-STAT CHEM 8, ED - Abnormal; Notable for the following components:   Chloride 100 (*)    Glucose, Bld 113 (*)    Hemoglobin 8.8 (*)    HCT 26.0 (*)    All other components within normal limits  I-STAT CG4 LACTIC ACID, ED - Abnormal; Notable for the following components:   Lactic Acid, Venous 2.81  (*)    All other components within normal limits     Na 139 K 4.3  Cr  stable,  Lab Results  Component Value Date   CREATININE 0.90 04/29/2017   CREATININE 0.85 04/29/2017   CREATININE 0.46 04/22/2017    CRP 5.7 down from 18 in March Sed rate 69 down from 105 in March WBC   18.3  HG/HCT  Up from baseline see below    Component Value Date/Time   HGB 8.8 (L) 04/29/2017 1845   HCT 26.0 (L) 04/29/2017 1845     Lactic Acid, Venous    Component Value Date/Time   LATICACIDVEN 2.81 (HH) 04/29/2017 2146      UA  no evidence of UTI     CXR -  NON acute  CTabd/pelvis -  Non-acute persistent fluid collections and extensive degenerative disk disease  ECG:  Personally reviewed by me showing: HR :97 Rhythm:  A.fib.   no evidence of ischemic changes QTCWNL    ED Triage Vitals  Enc Vitals Group     BP 04/29/17 1614 (!) 118/95     Pulse Rate 04/29/17 1614 70     Resp 04/29/17 1614 18     Temp 04/29/17 1614 98.4 F (36.9 C)     Temp Source 04/29/17 1614 Oral     SpO2 04/29/17 1614 100 %     Weight 04/29/17 1842 155 lb (70.3 kg)     Height 04/29/17 1842 4\' 11"  (1.499 m)     Head Circumference --      Peak Flow --      Pain Score 04/29/17 1740 9     Pain Loc --      Pain Edu? --      Excl. in GC? --   TMAX(24)@     on arrival  ED Triage Vitals  Enc Vitals Group     BP 04/29/17 1614 (!) 118/95     Pulse Rate 04/29/17 1614 70     Resp 04/29/17 1614 18     Temp 04/29/17 1614 98.4 F (36.9 C)     Temp Source 04/29/17 1614 Oral     SpO2 04/29/17 1614 100 %     Weight 04/29/17 1842 155 lb (70.3 kg)     Height 04/29/17 1842 4\' 11"  (1.499 m)     Head Circumference --      Peak Flow --      Pain Score 04/29/17 1740 9     Pain Loc --      Pain Edu? --      Excl. in GC? --      Latest  Blood pressure (!) 91/53, pulse 98, temperature 98.5 F (36.9 C), temperature source Rectal, resp. rate 17, height 4\' 11"  (1.499 m), weight 70.3 kg (155 lb), SpO2 100  %.  Following Medications were ordered in ER: Medications  0.9 %  sodium chloride infusion (has no administration in time range)  sodium chloride 0.9 % bolus 1,000 mL (0 mLs Intravenous Stopped 04/29/17 1952)  fentaNYL (SUBLIMAZE) injection 50 mcg (50 mcg Intravenous Given 04/29/17 1754)  ondansetron (ZOFRAN) injection 4 mg (4 mg Intravenous Given 04/29/17 1753)  sodium chloride 0.9 % bolus 1,000 mL (0 mLs Intravenous Stopped 04/29/17 2016)  lactated ringers bolus 500 mL (0 mLs Intravenous Stopped 04/29/17 2031)  hydrocortisone sodium succinate (SOLU-CORTEF) 100 MG injection 100 mg (100 mg Intravenous Given 04/29/17 1934)  levofloxacin (LEVAQUIN) IVPB 750 mg (0 mg Intravenous Stopped 04/29/17 2215)  aztreonam (AZACTAM) 2 GM IVPB (0 g Intravenous Stopped 04/29/17 2215)  vancomycin (VANCOCIN) IVPB 1000 mg/200 mL premix (0 mg Intravenous Stopped 04/29/17 2318)  iopamidol (ISOVUE-300) 61 % injection 100 mL (100 mLs Intravenous Contrast Given 04/29/17 2013)  fentaNYL (SUBLIMAZE) injection 50 mcg (50 mcg Intravenous Given 04/29/17 2154)  lactated ringers bolus 500 mL (0 mLs Intravenous Stopped 04/29/17 2320)     ER Provider Called:   PCCM They Recommend admission to medcine Will see in cosult  Hospitalist was called for admission for SIRS and hypotension   Review of Systems:    Pertinent positives include: back pain  Constitutional:  No weight loss, night sweats, Fevers, chills, fatigue, weight loss  HEENT:  No headaches, Difficulty swallowing,Tooth/dental problems,Sore throat,  No sneezing, itching, ear ache, nasal congestion, post nasal drip,  Cardio-vascular:  No chest pain, Orthopnea, PND, anasarca, dizziness, palpitations.no Bilateral lower extremity swelling  GI:  No heartburn, indigestion, abdominal pain, nausea, vomiting, diarrhea, change in bowel habits, loss of appetite, melena, blood in stool, hematemesis Resp:  no shortness of breath at rest. No dyspnea on exertion, No excess mucus, no  productive cough, No non-productive cough, No coughing up of blood.No change in color of mucus.No wheezing. Skin:  no rash or lesions. No jaundice GU:  no dysuria, change in color of urine, no urgency or frequency. No straining to urinate.  No flank pain.  Musculoskeletal:  No joint pain or no joint swelling. No decreased range of motion. No   Psych:  No change in mood or affect. No depression or anxiety. No memory loss.  Neuro: no localizing neurological complaints, no tingling, no weakness, no double vision, no gait abnormality, no slurred speech, no confusion  As per HPI otherwise 10 point review of systems negative.   Past Medical History:   Past Medical History:  Diagnosis Date  . A-fib (HCC)    "just after knee 2nd OR" (11/07/2012)  . Anemia   . Arthritis    RA , SACROTIC ARTHRITIS  . Complication of anesthesia    found to be in afib when she woke up  . Dysrhythmia   . GERD (gastroesophageal reflux disease)   . Head injury, closed, with concussion   . History of blood transfusion    "after 1 of my knee ORs and today" (11/07/2012)  . Hypertension   . Peripheral neuropathy    "from back OR in 2005; in hands and feet" (11/07/2012)  . Psoriatic arthritis (HCC)    on Prednisone Rx      Past Surgical History:  Procedure Laterality Date  . ANKLE FUSION Right 01/07/2016   Procedure: Right Tibiocalcaneal Fusion;  Surgeon: Nadara Mustard, MD;  Location: [facility] OR;  Service: Orthopedics;  Laterality: Right;  . BACK SURGERY    . CATARACT EXTRACTION W/ INTRAOCULAR LENS  IMPLANT, BILATERAL Bilateral  2007  . CHOLECYSTECTOMY  2000's  . COLONOSCOPY N/A 11/10/2012   Procedure: COLONOSCOPY;  Surgeon: Theda Belfast, MD;  Location: [facility] ENDOSCOPY;  Service: Endoscopy;  Laterality: N/A;  . ESOPHAGOGASTRODUODENOSCOPY N/A 11/10/2012   Procedure: ESOPHAGOGASTRODUODENOSCOPY (EGD);  Surgeon: Theda Belfast, MD;  Location: [facility] ENDOSCOPY;  Service: Endoscopy;  Laterality: N/A;  . EYE SURGERY    .  JOINT REPLACEMENT    . POSTERIOR LUMBAR FUSION  2005   "got a rod and 4 pins in there" (11/07/2012)  . REPLACEMENT TOTAL KNEE BILATERAL Bilateral 2007  . TRANSTHORACIC ECHOCARDIOGRAM  05/24/2006  . TUBAL LIGATION  1974  . VAGINAL HYSTERECTOMY  1983    Social History:  Ambulatory  wheelchair bound,     reports that she quit smoking about 35 years ago. Her smoking use included cigarettes. She has a 15.00 pack-year smoking history. She has never used smokeless tobacco. She reports that she does not drink alcohol or use drugs.     Family History:   Family History  Problem Relation Age of Onset  . Heart attack Father        1951-deceased   . Liver disease Mother   . Aneurysm Son 35       Deceased     Allergies: Allergies  Allergen Reactions  . Penicillins Anaphylaxis    THROAT SWELLING  Has patient had a PCN reaction causing immediate rash, facial/tongue/throat swelling, SOB or lightheadedness with hypotension:  # # YES # #  Has patient had a PCN reaction causing severe rash involving mucus membranes or skin necrosis: # # # YES # # # Has patient had a PCN reaction that required hospitalization:No Has patient had a PCN reaction occurring within the last 10 years:  # # YES # #  If all of the above answers are "NO", then may proceed with Cephalosporin use.   Jake Seats [Denosumab] Other (See Comments)    Severe Pain in the groin area.  . Fosamax [Alendronate Sodium]     UNSPECIFIED REACTION      Prior to Admission medications   Medication Sig Start Date End Date Taking? Authorizing Provider  Apremilast (OTEZLA) 30 MG TABS Take 30 mg by mouth 2 (two) times daily.   Yes [provider]  aspirin EC 81 MG tablet Take 81 mg by mouth every evening.   Yes [provider]  DILT-XR 180 MG 24 hr capsule Take 180 mg by mouth at bedtime. 04/07/17  Yes [provider]  DULoxetine (CYMBALTA) 30 MG capsule Take 30 mg by mouth daily. Take along with 60 mg capsule=90  mg 03/24/17  Yes [provider]  DULoxetine (CYMBALTA) 60 MG capsule Take 60 mg by mouth daily. Take along with 30mg  capsule to make a total of 90mg    Yes [provider]  FORTEO 600 MCG/2.4ML SOLN Inject 20 mcg into the skin daily.  03/26/17  Yes [provider]  furosemide (LASIX) 80 MG tablet Take 80 mg by mouth.   Yes [provider]  gabapentin (NEURONTIN) 600 MG tablet Take 600-1,200 mg by mouth 2 (two) times daily. 1 tablet in morning and 2 tablets at night   Yes [provider]  HYDROcodone-acetaminophen (NORCO/VICODIN) 5-325 MG tablet Take 1-2 tablets by mouth every 4 (four) hours as needed. Patient taking differently: Take 1-2 tablets by mouth every 4 (four) hours as needed. AND TWO TABLETS NIGHTLY AT BEDTIME 04/22/17  Yes Noralee Stain, DO  levofloxacin (LEVAQUIN) 750 MG tablet Take 1  tablet (750 mg total) by mouth daily. 04/22/17 05/26/17 Yes Noralee Stain, DO  losartan (COZAAR) 100 MG tablet Take 100 mg by mouth daily.  03/09/17  Yes [provider]  Magnesium 250 MG TABS Take 1 tablet by mouth every evening.    Yes [provider]  methocarbamol (ROBAXIN) 500 MG tablet Take 1 tablet (500 mg total) by mouth every 8 (eight) hours as needed for muscle spasms. 04/22/17  Yes Noralee Stain, DO  metoprolol succinate (TOPROL-XL) 50 MG 24 hr tablet Take 1 tablet (50 mg total) by mouth daily. Take with or immediately following a meal. 04/22/17  Yes Noralee Stain, DO  Multiple Vitamin (MULTIVITAMIN WITH MINERALS) TABS Take 1 tablet by mouth daily.   Yes [provider]  omeprazole (PRILOSEC) 20 MG capsule Take 20 mg by mouth every evening. 11/08/15  Yes [provider]  potassium chloride SA (K-DUR,KLOR-CON) 20 MEQ tablet Take 20 mEq by mouth 2 (two) times daily.    Yes [provider]  predniSONE (DELTASONE) 5 MG tablet Take 5 mg by mouth daily.  08/12/12  Yes Tisovec, Adelfa Koh, MD  vitamin C (ASCORBIC ACID) 500  MG tablet Take 500 mg by mouth 2 (two) times daily.   Yes [provider]  Vitamin D, Ergocalciferol, (DRISDOL) 50000 UNITS CAPS Take 50,000 Units by mouth every Tuesday.    Yes [provider]  zinc sulfate 220 (50 Zn) MG capsule Take 220 mg by mouth daily.   Yes [provider]  acetaminophen (TYLENOL) 325 MG tablet Take 2 tablets (650 mg total) by mouth every 4 (four) hours as needed. Patient taking differently: Take 325 mg by mouth every 6 (six) hours as needed (for pain.).  11/11/12   Christiane Ha, MD  mupirocin ointment (BACTROBAN) 2 % Apply 1 application topically 2 (two) times daily. Apply to the affected area 2 times a day Patient not taking: Reported on 04/29/2017 01/29/16   Nadara Mustard, MD  PROAIR HFA 108 [phone] BASE) MCG/ACT inhaler Inhale 1-2 puffs into the lungs every 6 (six) hours as needed for wheezing or shortness of breath.  02/06/13   [provider]  silver sulfADIAZINE (SILVADENE) 1 % cream Apply 1 application topically daily. Patient not taking: Reported on 04/29/2017 11/11/16   Adonis Huguenin, NP   Physical Exam: Blood pressure (!) 91/53, pulse 98, temperature 98.5 F (36.9 C), temperature source Rectal, resp. rate 17, height 4\' 11"  (1.499 m), weight 70.3 kg (155 lb), SpO2 100 %. 1. General:  in No Acute distress   Chronically ill -appearing 2. Psychological: Alert and   Oriented 3. Head/ENT:   Moist Mucous Membranes                          Head Non traumatic, neck supple                            Poor Dentition 4. SKIN:   decreased Skin turgor,  Skin clean Dry  Right foot ulcer noted   And sacral ulcer noted    5. Heart: Regular rate and rhythm no  Murmur, no Rub or gallop 6. Lungs: no wheezes or crackles   7. Abdomen: Soft,  non-tender, Non distended   obese  bowel sounds present 8. Lower extremities: no clubbing, cyanosis, or edema 9. Neurologically Grossly intact, moving all 4 extremities equally  10. MSK: Normal range of  motion. Tenderness over subcutaneous mobile nodule over lumbar spine   LABS:     Recent Labs  Lab 04/29/17 1759 04/29/17 1845  WBC 18.3*  --   NEUTROABS 15.4*  --   HGB 9.5* 8.8*  HCT 30.8* 26.0*  MCV 90.6  --   PLT 618*  --    Basic Metabolic Panel: Recent Labs  Lab 04/29/17 1759 04/29/17 1845  NA 138 139  K 4.4 4.3  CL 99* 100*  CO2 27  --   GLUCOSE 122* 113*  BUN 21* 17  CREATININE 0.85 0.90  CALCIUM 9.7  --       Recent Labs  Lab 04/29/17 1759  AST 29  ALT 17  ALKPHOS 110  BILITOT 0.3  PROT 6.1*  ALBUMIN 2.6*   No results for input(s): LIPASE, AMYLASE in the last 168 hours. No results for input(s): AMMONIA in the last 168 hours.    HbA1C: No results for input(s): HGBA1C in the last 72 hours. CBG: No results for input(s): GLUCAP in the last 168 hours.    Urine analysis:    Component Value Date/Time   COLORURINE YELLOW 04/29/2017 2100   APPEARANCEUR CLEAR 04/29/2017 2100   LABSPEC 1.014 04/29/2017 2100   PHURINE 7.0 04/29/2017 2100   GLUCOSEU NEGATIVE 04/29/2017 2100   HGBUR NEGATIVE 04/29/2017 2100   BILIRUBINUR NEGATIVE 04/29/2017 2100   KETONESUR NEGATIVE 04/29/2017 2100   PROTEINUR NEGATIVE 04/29/2017 2100   UROBILINOGEN 0.2 09/14/2014 1637   NITRITE NEGATIVE 04/29/2017 2100   LEUKOCYTESUR NEGATIVE 04/29/2017 2100    Cultures:    Component Value Date/Time   SDES  04/19/2017 1400    BLOOD RIGHT HAND Performed at [facility], [address] [place], [place] [address]    SPECREQUEST  04/19/2017 1400    BOTTLES DRAWN AEROBIC ONLY Blood Culture adequate volume Performed at [facility], [address], [place], [place] [address]    CULT  04/19/2017 1400    NO GROWTH 5 DAYS Performed at [facility], [address], [place], [place] [address]    REPTSTATUS 04/24/2017 FINAL 04/19/2017 1400     Radiological Exams on Admission: Dg Chest 2 View  Result Date: 04/29/2017 CLINICAL DATA:   Cough with abnormal right lower lobe physical exam. EXAM: CHEST - 2 VIEW COMPARISON:  04/17/2017 FINDINGS: Heart size is normal. There is aortic atherosclerosis. The lungs are clear. The vascularity is normal. No effusions. Chronic degenerative changes of the shoulder and spine as seen previously. IMPRESSION: No active disease by radiography. Specifically, no right lower lobe disease identified. Electronically Signed   By: Paulina Fusi M.D.   On: 04/29/2017 17:39   Ct Abdomen Pelvis W Contrast  Result Date: 04/29/2017 CLINICAL DATA:  Back spasms for 2 weeks worsening today. Nausea and vomiting. Abdominal distention. EXAM: CT ABDOMEN AND PELVIS WITH CONTRAST TECHNIQUE: Multidetector CT imaging of the abdomen and pelvis was performed using the standard protocol following bolus administration of intravenous contrast. CONTRAST:  ISOVUE-300 IOPAMIDOL (ISOVUE-300) INJECTION 61% COMPARISON:  04/17/2017 FINDINGS: Lower chest: Right coronary artery atherosclerotic calcification. Small but new left pleural effusion. Hepatobiliary: Hypodensity in segment 4 of the liver along the falciform ligament most likely due to focal fatty infiltration. Cholecystectomy. Pancreas: Unremarkable Spleen: Small hypodensities in the spleen are nonspecific and may be related to the relatively early phase of contrast on the initial acquisition series. These are not well seen on the more delayed series. Adrenals/Urinary Tract: The adrenal glands appear  normal. Exophytic 10 by 9 mm right kidney upper pole lesion measures at fluid density on image 29/2 and is probably a benign cyst. Stomach/Bowel: Sigmoid colon diverticulosis without active diverticulitis. Vascular/Lymphatic: Aortoiliac atherosclerotic vascular disease. No pathologic adenopathy. Reproductive: Uterus absent.  Adnexa unremarkable. Other: No supplemental non-categorized findings. Musculoskeletal: Complex fluid collection extending posteriorly from the right ischial tuberosity  with mildly enhancing margins. On the lowest image this associated fluid collection measures 5.6 by 5.4 cm, smaller than on 04/17/2017 but still substantial. There is a truncated/shaved appearance of the posterior margin of the ischial tuberosity. A smaller right trochanteric bursal fluid collection has mildly enhancing margins and measures 3.6 by 1.5 by 7.1 cm (volume = 20 cm^3). Again, this is smaller than on 04/17/2017, but has undergone interval drainage. Residual infection not excluded. Lower thoracic and lumbar spondylosis and degenerative disc disease with posterolateral rod and pedicle screw fixation at L3-L4-L5. Chronic grade 1 anterolisthesis at L4-5 and interbody fusion at L3-4. Continued loss of disc space most striking at L2-3 and T12-L1. Stable endplate irregularities in the lower thoracic spine and at L1-2. Periumbilical hernias contain omental adipose tissue. IMPRESSION: 1. Abnormal fluid collections in the right trochanteric bursa and below the right ischium, with some irregularity of the right ischial tuberosity stable from the prior exam. By report, the prior fluid cultures of both of these collections aspirated on 04/17/2017 were negative for organisms. Both of these collections are smaller than on 04/17/2017, although still substantial. Stable truncated/shaved appearance of the right ischial tuberosity. 2. 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 Expectation for lumbar impingement at the L3-4 and L4-5 levels, and on the left at L5-S1. There is likely also impingement at L1-2, L2-3, and T10-11. 3. Small but new left pleural effusion. 4. No cause for abdominal distention is identified. No dilated bowel. 5. Other imaging findings of potential clinical significance: Aortic Atherosclerosis (ICD10-I70.0). Coronary atherosclerosis. Sigmoid diverticulosis without active diverticulitis. Electronically Signed   By: Gaylyn Rong M.D.   On: 04/29/2017 20:54    Chart has been reviewed    Assessment/Plan  77 y.o. 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 Systemic inflammatory response syndrome (SIRS) (HCC) - unclear source - additional imaging of the spine ordered to RO abscess. Continue broad spectrum ABX vanc. levaquinand Aztreonam . PAF (paroxysmal atrial fibrillation) (HCC) -           - CHA2DS2 vas score 5 :   Not on anticoagulation secondary to Risk of Falls,  recurrent bleeding         -  Rate control:  Currently controlled with  Toprolol,   will hold due to hypotension    . Hypotension - rehydrate hold home BP meds. Monitor in step down . Adrenal insufficiency (HCC) -  Stress dose  Steroids ordered   Sacral decubitus and foot ulcer - no evidence of infection - wound care consult ordered.   Other plan as per orders.  DVT prophylaxis:  SCD   Code Status:  FULL CODE  as  per patient   Family Communication:   Family not at  Bedside  plan of care was discussed with Daughter,  Disposition Plan:                              Back to current facility when stable                        stable                         Social Work    consulted                          Consults called: PCCM aware  Admission status:   inpatient       Level of care    SDU       Therisa Doyne 04/30/2017, 1:33 AM    [facility]  Pager [phone]   after 2 AM please page floor  coverage PA If 7AM-7PM, please contact the day team taking care of the patient  [email or web address]  Password [email or web address]

## 2017-04-29 NOTE — Progress Notes (Addendum)
Consult request has been received. CSW attempting to follow up at present time.  CSW called Lanora Manis in admissions at [facility] at ph: [phone] and asked if there are any private rooms available.  Lanora Manis states she isn't sure but will check with Duwayne Heck in administration and call the CSW back.  CSW will verify with EDP that pt will be D/C'ing from the ED or being admitted.  7:37 PM Per EPD, pt will be admitted.  7:49 PM CSW spoke to Duwayne Heck again who confirmed pt already has a private room and will have a private room whenever she returns.  Per Duwayne Heck at [place], April will be on duty at [employer] and can facilitate pt's return to a private room on Saturday or Sunday should the pt be D/C'd over the weekend. CSW will update the pt.  CSW will continue to follow for D/C needs.  Dorothe Pea. Tyger Wichman, LCSW, LCAS, CSI Clinical Social Worker Ph: [phone]

## 2017-04-29 NOTE — ED Notes (Signed)
Pt is alert and oriented x 4 and is verbally responsive pt is side lying to let side and pt reports that it is painful to lay on her back. PT reports 8/10 mid back pain descibed as spasm,  pain medication at home has not been effective. Pt BP became hypotensive spb in 80's.

## 2017-04-29 NOTE — ED Provider Notes (Addendum)
[facility]-EMERGENCY DEPT Provider Note   CSN: [id number] Arrival date & time: 04/29/17  1557     History   Chief Complaint Chief Complaint  Patient presents with  . Back Pain    HPI Andrea Yu is a 77 y.o. female.  HPI 77 year old female with extensive past medical history as below including recent admission for sepsis secondary to UTI as well as possible group B strep septicemia here with back pain.  Patient states that her symptoms started today.  She states she has had increasing spasms pain of her back over the last 24 hours.  She describes the pain is intermittent aching, throbbing, spasm-like pain that shoots throughout her lower back.  She had associated nausea and chills.  No fevers.  No diarrhea.  No change in bowel habits.  She has had some urinary frequency.  Of note, these presentation symptoms are similar to her recent admission.  She has had worsening of her pain since she sat upright around 1 PM today, and subsequently sent for evaluation.  Denies any new numbness or weakness in her legs.  No loss of bowel or bladder function.  Past Medical History:  Diagnosis Date  . A-fib (HCC)    "just after knee 2nd OR" (11/07/2012)  . Anemia   . Arthritis    RA , SACROTIC ARTHRITIS  . Complication of anesthesia    found to be in afib when she woke up  . Dysrhythmia   . GERD (gastroesophageal reflux disease)   . Head injury, closed, with concussion   . History of blood transfusion    "after 1 of my knee ORs and today" (11/07/2012)  . Hypertension   . Peripheral neuropathy    "from back OR in 2005; in hands and feet" (11/07/2012)  . Psoriatic arthritis (HCC)    on Prednisone Rx    Patient Active Problem List   Diagnosis Date Noted  . Sepsis due to group B Streptococcus (HCC) 04/19/2017  . Stage I pressure ulcer of sacral region 04/19/2017  . Altered mental status 04/17/2017  . Hip osteomyelitis, right (HCC) 04/17/2017  . Pressure injury of skin  04/17/2017  . History of blood transfusion   . Head injury, closed, with concussion   . GERD (gastroesophageal reflux disease)   . Dysrhythmia   . Complication of anesthesia   . Arthritis   . Anemia   . Displaced spiral fracture of shaft of right tibia, subsequent encounter for closed fracture with delayed healing 11/11/2016  . 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 Subacute osteomyelitis, right ankle and foot (HCC)   . SAH (subarachnoid hemorrhage) (HCC) 09/14/2014  . Atrial fibrillation with RVR (HCC) 09/14/2014  . Hypertension 09/14/2014  . Laceration of eyebrow, left 09/14/2014  . Fall 09/14/2014  . Gastritis, acute 11/11/2012  . Cellulitis of leg, right 11/11/2012  . Obesity (BMI 30-39.9)- suspected sleep apnea, declines sleep study 11/10/2012  . Psoriatic arthritis (HCC) 11/06/2012  . Iron deficiency anemia- transfused this admission 11/06/2012  . Depression 11/06/2012  . Peripheral neuropathy 11/06/2012  . Shock circulatory on admission 11/06/12 11/06/2012  . Systemic inflammatory response syndrome (SIRS) (HCC) 11/06/2012  . Nausea and vomiting 08/09/2012  . Adrenal insufficiency (HCC) 08/09/2012  . PAF (paroxysmal atrial fibrillation) (HCC) 08/09/2012  . Hypotension 08/09/2012  . Dehydration 08/09/2012  Past Surgical History:  Procedure Laterality Date  . ANKLE FUSION Right 01/07/2016   Procedure: Right Tibiocalcaneal Fusion;  Surgeon: Nadara Mustard, MD;  Location: [facility] OR;  Service: Orthopedics;  Laterality: Right;  . BACK SURGERY    . CATARACT EXTRACTION W/ INTRAOCULAR LENS  IMPLANT, BILATERAL Bilateral 2007  . CHOLECYSTECTOMY  2000's  . COLONOSCOPY N/A 11/10/2012   Procedure: COLONOSCOPY;  Surgeon: Theda Belfast, MD;  Location: [facility] ENDOSCOPY;  Service: Endoscopy;  Laterality: N/A;  .  ESOPHAGOGASTRODUODENOSCOPY N/A 11/10/2012   Procedure: ESOPHAGOGASTRODUODENOSCOPY (EGD);  Surgeon: Theda Belfast, MD;  Location: [facility] ENDOSCOPY;  Service: Endoscopy;  Laterality: N/A;  . EYE SURGERY    . JOINT REPLACEMENT    . POSTERIOR LUMBAR FUSION  2005   "got a rod and 4 pins in there" (11/07/2012)  . REPLACEMENT TOTAL KNEE BILATERAL Bilateral 2007  . TRANSTHORACIC ECHOCARDIOGRAM  05/24/2006  . TUBAL LIGATION  1974  . VAGINAL HYSTERECTOMY  1983     OB History   None      Home Medications    Prior to Admission medications   Medication Sig Start Date End Date Taking? Authorizing Provider  Apremilast (OTEZLA) 30 MG TABS Take 30 mg by mouth 2 (two) times daily.   Yes [provider]  aspirin EC 81 MG tablet Take 81 mg by mouth every evening.   Yes [provider]  DILT-XR 180 MG 24 hr capsule Take 180 mg by mouth at bedtime. 04/07/17  Yes [provider]  DULoxetine (CYMBALTA) 30 MG capsule Take 30 mg by mouth daily. Take along with 60 mg capsule=90 mg 03/24/17  Yes [provider]  DULoxetine (CYMBALTA) 60 MG capsule Take 60 mg by mouth daily. Take along with 30mg  capsule to make a total of 90mg    Yes [provider]  FORTEO 600 MCG/2.4ML SOLN Inject 20 mcg into the skin daily.  03/26/17  Yes [provider]  furosemide (LASIX) 80 MG tablet Take 80 mg by mouth.   Yes [provider]  gabapentin (NEURONTIN) 600 MG tablet Take 600-1,200 mg by mouth 2 (two) times daily. 1 tablet in morning and 2 tablets at night   Yes [provider]  HYDROcodone-acetaminophen (NORCO/VICODIN) 5-325 MG tablet Take 1-2 tablets by mouth every 4 (four) hours as needed. Patient taking differently: Take 1-2 tablets by mouth every 4 (four) hours as needed. AND TWO TABLETS NIGHTLY AT BEDTIME 04/22/17  Yes 05/26/17, DO  levofloxacin (LEVAQUIN) 750 MG tablet Take 1 tablet (750 mg total) by mouth daily. 04/22/17 05/26/17 Yes 04/24/17, DO    losartan (COZAAR) 100 MG tablet Take 100 mg by mouth daily.  03/09/17  Yes [provider]  Magnesium 250 MG TABS Take 1 tablet by mouth every evening.    Yes [provider]  methocarbamol (ROBAXIN) 500 MG tablet Take 1 tablet (500 mg total) by mouth every 8 (eight) hours as needed for muscle spasms. 04/22/17  Yes 03/11/17, DO  metoprolol succinate (TOPROL-XL) 50 MG 24 hr tablet Take 1 tablet (50 mg total) by mouth daily. Take with or immediately following a meal. 04/22/17  Yes Noralee Stain, DO  Multiple Vitamin (MULTIVITAMIN WITH MINERALS) TABS Take 1 tablet by mouth daily.   Yes [provider]  omeprazole (PRILOSEC) 20 MG capsule Take 20 mg by mouth every evening. 11/08/15  Yes [provider]  potassium chloride SA (K-DUR,KLOR-CON) 20 MEQ tablet Take 20 mEq by mouth 2 (two) times daily.  Yes [provider]  predniSONE (DELTASONE) 5 MG tablet Take 5 mg by mouth daily.  08/12/12  Yes Tisovec, Adelfa Koh, MD  vitamin C (ASCORBIC ACID) 500 MG tablet Take 500 mg by mouth 2 (two) times daily.   Yes [provider]  Vitamin D, Ergocalciferol, (DRISDOL) 50000 UNITS CAPS Take 50,000 Units by mouth every Tuesday.    Yes [provider]  zinc sulfate 220 (50 Zn) MG capsule Take 220 mg by mouth daily.   Yes [provider]  acetaminophen (TYLENOL) 325 MG tablet Take 2 tablets (650 mg total) by mouth every 4 (four) hours as needed. Patient taking differently: Take 325 mg by mouth every 6 (six) hours as needed (for pain.).  11/11/12   Christiane Ha, MD  mupirocin ointment (BACTROBAN) 2 % Apply 1 application topically 2 (two) times daily. Apply to the affected area 2 times a day Patient not taking: Reported on 04/29/2017 01/29/16   Nadara Mustard, MD  PROAIR HFA 108 [id number] BASE) MCG/ACT inhaler Inhale 1-2 puffs into the lungs every 6 (six) hours as needed for wheezing or shortness of breath.  02/06/13   [provider]   silver sulfADIAZINE (SILVADENE) 1 % cream Apply 1 application topically daily. Patient not taking: Reported on 04/29/2017 11/11/16   Adonis Huguenin, NP    Family History Family History  Problem Relation Age of Onset  . Heart attack Father        1951-deceased   . Liver disease Mother   . Aneurysm Son 51       Deceased     Social History Social History   Tobacco Use  . Smoking status: Former Smoker    Packs/day: 0.50    Years: 30.00    Pack years: 15.00    Types: Cigarettes    Last attempt to quit: 08/09/1981    Years since quitting: 35.7  . Smokeless tobacco: Never Used  Substance Use Topics  . Alcohol use: No  . Drug use: No     Allergies   Penicillins; Prolia [denosumab]; and Fosamax [alendronate sodium]   Review of Systems Review of Systems  Constitutional: Positive for fatigue.  Musculoskeletal: Positive for arthralgias, back pain and myalgias.  Neurological: Positive for weakness.  All other systems reviewed and are negative.    Physical Exam Updated Vital Signs BP (!) 91/53 (BP Location: Right Arm)   Pulse 98   Temp 98.5 F (36.9 C) (Rectal)   Resp 17   Ht 4\' 11"  (1.499 m)   Wt 70.3 kg (155 lb)   SpO2 100%   BMI 31.31 kg/m   Physical Exam  Constitutional: She is oriented to person, place, and time. She appears well-developed and well-nourished. She has a sickly appearance. No distress.  HENT:  Head: Normocephalic and atraumatic.  Eyes: Conjunctivae are normal.  Neck: Neck supple.  Cardiovascular: Normal rate, regular rhythm and normal heart sounds. Exam reveals no friction rub.  No murmur heard. Pulmonary/Chest: Effort normal and breath sounds normal. No respiratory distress. She has no wheezes. She has no rales.  Abdominal: She exhibits no distension.  Musculoskeletal: She exhibits no edema.  Exquisite tenderness along midline lower thoracic and throughout the lumbar spine.  No recent deformity.  No palpable masses or swelling.   Neurological: She is alert and oriented to person, place, and time. She exhibits normal muscle tone.  Strength 5 out of 5 bilateral lower extremities.  Diminished sensation bilaterally, which is chronic.  Skin: Skin  is warm. Capillary refill takes less than 2 seconds.  Psychiatric: She has a normal mood and affect.  Nursing note and vitals reviewed.    ED Treatments / Results  Labs (all labs ordered are listed, but only abnormal results are displayed) Labs Reviewed  CBC WITH DIFFERENTIAL/PLATELET - Abnormal; Notable for the following components:      Result Value   WBC 18.3 (*)    RBC 3.40 (*)    Hemoglobin 9.5 (*)    HCT 30.8 (*)    RDW 15.7 (*)    Platelets 618 (*)    Neutro Abs 15.4 (*)    Monocytes Absolute 1.3 (*)    All other components within normal limits  COMPREHENSIVE METABOLIC PANEL - Abnormal; Notable for the following components:   Chloride 99 (*)    Glucose, Bld 122 (*)    BUN 21 (*)    Total Protein 6.1 (*)    Albumin 2.6 (*)    All other components within normal limits  SEDIMENTATION RATE - Abnormal; Notable for the following components:   Sed Rate 69 (*)    All other components within normal limits  C-REACTIVE PROTEIN - Abnormal; Notable for the following components:   CRP 5.7 (*)    All other components within normal limits  I-STAT CG4 LACTIC ACID, ED - Abnormal; Notable for the following components:   Lactic Acid, Venous 2.55 (*)    All other components within normal limits  I-STAT CHEM 8, ED - Abnormal; Notable for the following components:   Chloride 100 (*)    Glucose, Bld 113 (*)    Hemoglobin 8.8 (*)    HCT 26.0 (*)    All other components within normal limits  I-STAT CG4 LACTIC ACID, ED - Abnormal; Notable for the following components:   Lactic Acid, Venous 2.81 (*)    All other components within normal limits  CULTURE, BLOOD (ROUTINE X 2)  CULTURE, BLOOD (ROUTINE X 2)  URINE CULTURE  URINALYSIS, ROUTINE W REFLEX MICROSCOPIC  PROTIME-INR     EKG EKG Interpretation  Date/Time:  Friday April 29 2017 19:02:43 EDT Ventricular Rate:  96 PR Interval:    QRS Duration: 92 QT Interval:  363 QTC Calculation: 417 R Axis:   28 Text Interpretation:  Atrial fibrillation Ventricular premature complex Since last EKG, rate is improved ST-t changes have resolved Confirmed by Shaune Pollack [phone]) on 04/29/2017 8:35:20 PM   Radiology Dg Chest 2 View  Result Date: 04/29/2017 CLINICAL DATA:  Cough with abnormal right lower lobe physical exam. EXAM: CHEST - 2 VIEW COMPARISON:  04/17/2017 FINDINGS: Heart size is normal. There is aortic atherosclerosis. The lungs are clear. The vascularity is normal. No effusions. Chronic degenerative changes of the shoulder and spine as seen previously. IMPRESSION: No active disease by radiography. Specifically, no right lower lobe disease identified. Electronically Signed   By: Paulina Fusi M.D.   On: 04/29/2017 17:39   Ct Abdomen Pelvis W Contrast  Result Date: 04/29/2017 CLINICAL DATA:  Back spasms for 2 weeks worsening today. Nausea and vomiting. Abdominal distention. EXAM: CT ABDOMEN AND PELVIS WITH CONTRAST TECHNIQUE: Multidetector CT imaging of the abdomen and pelvis was performed using the standard protocol following bolus administration of intravenous contrast. CONTRAST:  ISOVUE-300 IOPAMIDOL (ISOVUE-300) INJECTION 61% COMPARISON:  04/17/2017 FINDINGS: Lower chest: Right coronary artery atherosclerotic calcification. Small but new left pleural effusion. Hepatobiliary: Hypodensity in segment 4 of the liver along the falciform ligament most likely due to focal fatty infiltration.  Cholecystectomy. Pancreas: Unremarkable Spleen: Small hypodensities in the spleen are nonspecific and may be related to the relatively early phase of contrast on the initial acquisition series. These are not well seen on the more delayed series. Adrenals/Urinary Tract: The adrenal glands appear normal. Exophytic 10 by 9 mm right  kidney upper pole lesion measures at fluid density on image 29/2 and is probably a benign cyst. Stomach/Bowel: Sigmoid colon diverticulosis without active diverticulitis. Vascular/Lymphatic: Aortoiliac atherosclerotic vascular disease. No pathologic adenopathy. Reproductive: Uterus absent.  Adnexa unremarkable. Other: No supplemental non-categorized findings. Musculoskeletal: Complex fluid collection extending posteriorly from the right ischial tuberosity with mildly enhancing margins. On the lowest image this associated fluid collection measures 5.6 by 5.4 cm, smaller than on 04/17/2017 but still substantial. There is a truncated/shaved appearance of the posterior margin of the ischial tuberosity. A smaller right trochanteric bursal fluid collection has mildly enhancing margins and measures 3.6 by 1.5 by 7.1 cm (volume = 20 cm^3). Again, this is smaller than on 04/17/2017, but has undergone interval drainage. Residual infection not excluded. Lower thoracic and lumbar spondylosis and degenerative disc disease with posterolateral rod and pedicle screw fixation at L3-L4-L5. Chronic grade 1 anterolisthesis at L4-5 and interbody fusion at L3-4. Continued loss of disc space most striking at L2-3 and T12-L1. Stable endplate irregularities in the lower thoracic spine and at L1-2. Periumbilical hernias contain omental adipose tissue. IMPRESSION: 1. Abnormal fluid collections in the right trochanteric bursa and below the right ischium, with some irregularity of the right ischial tuberosity stable from the prior exam. By report, the prior fluid cultures of both of these collections aspirated on 04/17/2017 were negative for organisms. Both of these collections are smaller than on 04/17/2017, although still substantial. Stable truncated/shaved appearance of the right ischial tuberosity. 2. 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 Expectation for  lumbar impingement at the L3-4 and L4-5 levels, and on the left at L5-S1. There is likely also impingement at L1-2, L2-3, and T10-11. 3. Small but new left pleural effusion. 4. No cause for abdominal distention is identified. No dilated bowel. 5. Other imaging findings of potential clinical significance: Aortic Atherosclerosis (ICD10-I70.0). Coronary atherosclerosis. Sigmoid diverticulosis without active diverticulitis. Electronically Signed   By: Gaylyn Rong M.D.   On: 04/29/2017 20:54    Procedures .Critical Care Performed by: Shaune Pollack, MD Authorized by: Shaune Pollack, MD   Critical care provider statement:    Critical care time (minutes):  45   Critical care time was exclusive of:  Separately billable procedures and treating other patients and teaching time   Critical care was necessary to treat or prevent imminent or life-threatening deterioration of the following conditions:  Respiratory failure, sepsis, circulatory failure and cardiac failure   Critical care was time spent personally by me on the following activities:  Development of treatment plan with patient or surrogate, discussions with consultants, evaluation of patient's response to treatment, examination of patient, obtaining history from patient or surrogate, ordering and performing treatments and interventions, ordering and review of laboratory studies, ordering and review of radiographic studies, pulse oximetry, re-evaluation of patient's condition and review of old charts   I assumed direction of critical care for this patient from another provider in my specialty: no     (including critical care time)  Medications Ordered in ED Medications  lactated ringers bolus 500 mL (500 mLs Intravenous New Bag/Given 04/29/17 2155)  0.9 %  sodium chloride infusion (has no administration in  time range)  sodium chloride 0.9 % bolus 1,000 mL (0 mLs Intravenous Stopped 04/29/17 1952)  fentaNYL (SUBLIMAZE) injection 50 mcg (50 mcg  Intravenous Given 04/29/17 1754)  ondansetron (ZOFRAN) injection 4 mg (4 mg Intravenous Given 04/29/17 1753)  sodium chloride 0.9 % bolus 1,000 mL (0 mLs Intravenous Stopped 04/29/17 2016)  lactated ringers bolus 500 mL (0 mLs Intravenous Stopped 04/29/17 2031)  hydrocortisone sodium succinate (SOLU-CORTEF) 100 MG injection 100 mg (100 mg Intravenous Given 04/29/17 1934)  levofloxacin (LEVAQUIN) IVPB 750 mg (0 mg Intravenous Stopped 04/29/17 2215)  aztreonam (AZACTAM) 2 GM IVPB (0 g Intravenous Stopped 04/29/17 2215)  vancomycin (VANCOCIN) IVPB 1000 mg/200 mL premix (1,000 mg Intravenous New Bag/Given 04/29/17 1945)  iopamidol (ISOVUE-300) 61 % injection 100 mL (100 mLs Intravenous Contrast Given 04/29/17 2013)  fentaNYL (SUBLIMAZE) injection 50 mcg (50 mcg Intravenous Given 04/29/17 2154)     Initial Impression / Assessment and Plan / ED Course  I have reviewed the triage vital signs and the nursing notes.  Pertinent labs & imaging results that were available during my care of the patient were reviewed by me and considered in my medical decision making (see chart for details).  Clinical Course as of Apr 30 2251  Fri Apr 29, 2017  1800 Erroneous - pt was sleeping with arm up  BP(!): 80/35 [CI]  1800 Reassuring, f/u labs  DG Chest 2 View [CI]  1800 77 yo F here with severe back pain and spasms. Recently hospitalized for same with sepsis, hip and back hematomas. On arrival here, pt afebrile, well appearing but with severe pain. Will check broad labs, imaging, and re-assess.   [CI]  1841 In setting of this market leukocytosis with persistent soft BP, CODE SEPSIS initiated. Broad-spectrum ABX given. Stress-dose steroid given as well.   [CI]  1921 Patient remains awake and alert.  Lactic acid elevated.  Code sepsis has been initiated.  She is to steroids given.  Unclear source at this time.  Awaiting CT scan as well as urinalysis.  Urine culture has been sent as well.   [CI]  2117 Imaging shows that her fluid  collections are similar in size to decrease.  Patient has diffuse degenerative back changes.  Given her recurrent sepsis with previous positive cultures and negative workup of these fluid collections, with primary complaint of back pain, concerned that there may be an underlying osteomyelitis.  She did not have MRI during her recent admission.  Will continue empiric antibiotics.   [CI]  2212 LA increasing slightly. Will consult intensivist - concern that she has osteo, possibly even epidural given her ? Of urinary retention.   [CI]    Clinical Course User Index [CI] Shaune Pollack, MD    Discussed with intensivist Dr.Smith and hospitalist Dr. Adela Glimpse. Will admit to step down at [facility]. Pt mental status remains stable, sepsis re-eval completed and pt with improving perfusion and BP.  Final Clinical Impressions(s) / ED Diagnoses   Final diagnoses:  Severe sepsis [facility])  Adrenal insufficiency [facility])    ED Discharge Orders    None       Shaune Pollack, MD 04/30/17 Ollen Bowl    Shaune Pollack, MD 05/16/17 [phone]

## 2017-04-29 NOTE — Clinical Social Work Note (Addendum)
Clinical Social Work Assessment  Patient Details  Name: Andrea Yu MRN: [id number] Date of Birth: May 10, 1940  Date of referral:  04/29/17               Reason for consult:  Facility Placement                Permission sought to share information with:  Facility Art therapist granted to share information::  Yes, Verbal Permission Granted  Name::        Agency::     Relationship::     Contact Information:     Housing/Transportation Living arrangements for the past 2 months:  [place] of Information:  Adult Children Patient Interpreter Needed:  None Criminal Activity/Legal Involvement Pertinent to Current Situation/Hospitalization:    Significant Relationships:  Adult Children Lives with:  Facility Resident Do you feel safe going back to the place where you live?  Yes Need for family participation in patient care:  Yes (Comment)  Care giving concerns:  Pt requested to return to a private room at [place] when she returned upon admission to the ED.  CSW called Benjamine Mola in admissions at [facility] at ph: [phone] and asked if there are any private rooms available.  Benjamine Mola states she isn't sure but will check with Andee Poles in administration and call the CSW back.  CSW will verify with EDP that pt will be D/C'ing from the ED or being admitted.  7:37 PM Per EPD, pt will be admitted.  7:49 PM CSW spoke to Andee Poles again who confirmed pt already has a private room and will have a private room whenever she returns.  Per Andee Poles at [place], April will be on duty at [employer] and can facilitate pt's return to a private room on Saturday or Sunday should the pt be D/C'd over the weekend. CSW will update the pt.  8:55 PM CSW spoke to pt's daughter as pt was away getting a CT scan and pt's daughter stated plan was to return to [facility] at D/C.  CSW relayed the above information to the pt's daughter.  Social Worker assessment / plan:  CSW met  with pt's daughter and confirmed pt's plan to be discharged back to [facility] to live at discharge.  CSW provided active listening and validated pt's daughter's concerns that pt be able to rehab at [facility] and then return home in a timely manner.   CSW DEPT was given permission to FL-2 and send referral information back out to [facility] at [facility] via the hub at D/C.  Pt had been living independently prior to being admitted to [facility] and then D/C'd from [facility] on approx 3/24 to [facility] and was still at [facility] until pt returned to [facility] on 4/6.  10:32 PM CSW confirmed this information with pt when pt returned from her CT scan.  Employment status:  Retired Nurse, adult PT Recommendations:  Not assessed at this time Information / Referral to community resources:     Patient/Family's Response to care:  Patient and pt's daughter agreeable to plan.  Pt's daughter supportive and strongly involved in pt.'s care.  Pt.'s daughter pleasant and appreciated CSW intervention.    Patient/Family's Understanding of and Emotional Response to Diagnosis, Current Treatment, and Prognosis:  Still assessing  Emotional Assessment Appearance:    Attitude/Demeanor/Rapport:    Affect (typically observed):    Orientation:    Alcohol / Substance use:    Psych involvement (Current and /or  in the community):     Discharge Needs  Concerns to be addressed:  No discharge needs identified Readmission within the last 30 days:  Yes Current discharge risk:  None Barriers to Discharge:  No Barriers Identified   Claudine Mouton, LCSWA 04/29/2017, 8:44 PM

## 2017-04-29 NOTE — ED Notes (Signed)
Patient transported to CT 

## 2017-04-29 NOTE — Progress Notes (Signed)
A consult was received from an ED physician for vancomycin, levofloxacin, aztreonam per pharmacy dosing.  The patient's profile has been reviewed for ht/wt/allergies/indication/available labs.   A one time order has been placed for vancomycin 1g, levofloxacin 750mg  and aztreonam 2g.    Further antibiotics/pharmacy consults should be ordered by admitting physician if indicated.                       Thank you, , PharmD, BCPS Pager: [phone] 04/29/2017  6:37 PM

## 2017-04-29 NOTE — ED Triage Notes (Addendum)
Pt arrived via EMS from SNF, [facility] Pt is c/o back spasm x 2 weeks , progressively worse today.. Pt denies falls or injuries. Pt reports PMHX of back surgery ( rod placement) Pt was given Vicodin and Robaxin at SNF  and pain has not improved. Pt is on home oxygen   EMS v/s BP 112/86, HR 98, RR 16 100% O2 2 lpm via 2nc

## 2017-04-30 ENCOUNTER — Inpatient Hospital Stay (HOSPITAL_COMMUNITY): Payer: Medicare Other

## 2017-04-30 DIAGNOSIS — M4644 Discitis, unspecified, thoracic region: Secondary | ICD-10-CM | POA: Diagnosis present

## 2017-04-30 DIAGNOSIS — M810 Age-related osteoporosis without current pathological fracture: Secondary | ICD-10-CM | POA: Diagnosis present

## 2017-04-30 DIAGNOSIS — L98491 Non-pressure chronic ulcer of skin of other sites limited to breakdown of skin: Secondary | ICD-10-CM | POA: Diagnosis not present

## 2017-04-30 DIAGNOSIS — E11621 Type 2 diabetes mellitus with foot ulcer: Secondary | ICD-10-CM | POA: Diagnosis not present

## 2017-04-30 DIAGNOSIS — M2190 Unspecified acquired deformity of unspecified limb: Secondary | ICD-10-CM

## 2017-04-30 DIAGNOSIS — L89609 Pressure ulcer of unspecified heel, unspecified stage: Secondary | ICD-10-CM

## 2017-04-30 DIAGNOSIS — Z981 Arthrodesis status: Secondary | ICD-10-CM | POA: Diagnosis not present

## 2017-04-30 DIAGNOSIS — E2740 Unspecified adrenocortical insufficiency: Secondary | ICD-10-CM | POA: Diagnosis not present

## 2017-04-30 DIAGNOSIS — A419 Sepsis, unspecified organism: Secondary | ICD-10-CM | POA: Diagnosis present

## 2017-04-30 DIAGNOSIS — M4624 Osteomyelitis of vertebra, thoracic region: Secondary | ICD-10-CM

## 2017-04-30 DIAGNOSIS — E1142 Type 2 diabetes mellitus with diabetic polyneuropathy: Secondary | ICD-10-CM | POA: Diagnosis present

## 2017-04-30 DIAGNOSIS — Z881 Allergy status to other antibiotic agents status: Secondary | ICD-10-CM | POA: Diagnosis not present

## 2017-04-30 DIAGNOSIS — Z993 Dependence on wheelchair: Secondary | ICD-10-CM | POA: Diagnosis not present

## 2017-04-30 DIAGNOSIS — Z87891 Personal history of nicotine dependence: Secondary | ICD-10-CM

## 2017-04-30 DIAGNOSIS — M1990 Unspecified osteoarthritis, unspecified site: Secondary | ICD-10-CM

## 2017-04-30 DIAGNOSIS — L89612 Pressure ulcer of right heel, stage 2: Secondary | ICD-10-CM | POA: Diagnosis present

## 2017-04-30 DIAGNOSIS — M069 Rheumatoid arthritis, unspecified: Secondary | ICD-10-CM | POA: Diagnosis present

## 2017-04-30 DIAGNOSIS — R6520 Severe sepsis without septic shock: Secondary | ICD-10-CM

## 2017-04-30 DIAGNOSIS — M869 Osteomyelitis, unspecified: Secondary | ICD-10-CM | POA: Diagnosis not present

## 2017-04-30 DIAGNOSIS — E1169 Type 2 diabetes mellitus with other specified complication: Secondary | ICD-10-CM

## 2017-04-30 DIAGNOSIS — M4640 Discitis, unspecified, site unspecified: Secondary | ICD-10-CM | POA: Diagnosis not present

## 2017-04-30 DIAGNOSIS — Z880 Allergy status to penicillin: Secondary | ICD-10-CM

## 2017-04-30 DIAGNOSIS — D649 Anemia, unspecified: Secondary | ICD-10-CM | POA: Diagnosis not present

## 2017-04-30 DIAGNOSIS — M7060 Trochanteric bursitis, unspecified hip: Secondary | ICD-10-CM | POA: Diagnosis not present

## 2017-04-30 DIAGNOSIS — L89151 Pressure ulcer of sacral region, stage 1: Secondary | ICD-10-CM | POA: Diagnosis present

## 2017-04-30 DIAGNOSIS — K219 Gastro-esophageal reflux disease without esophagitis: Secondary | ICD-10-CM | POA: Diagnosis present

## 2017-04-30 DIAGNOSIS — M549 Dorsalgia, unspecified: Secondary | ICD-10-CM | POA: Diagnosis present

## 2017-04-30 DIAGNOSIS — I10 Essential (primary) hypertension: Secondary | ICD-10-CM | POA: Diagnosis present

## 2017-04-30 DIAGNOSIS — I959 Hypotension, unspecified: Secondary | ICD-10-CM | POA: Diagnosis present

## 2017-04-30 DIAGNOSIS — M4804 Spinal stenosis, thoracic region: Secondary | ICD-10-CM | POA: Diagnosis not present

## 2017-04-30 DIAGNOSIS — M24671 Ankylosis, right ankle: Secondary | ICD-10-CM

## 2017-04-30 DIAGNOSIS — E1140 Type 2 diabetes mellitus with diabetic neuropathy, unspecified: Secondary | ICD-10-CM | POA: Diagnosis present

## 2017-04-30 DIAGNOSIS — L89159 Pressure ulcer of sacral region, unspecified stage: Secondary | ICD-10-CM | POA: Diagnosis not present

## 2017-04-30 DIAGNOSIS — M7061 Trochanteric bursitis, right hip: Secondary | ICD-10-CM | POA: Diagnosis not present

## 2017-04-30 DIAGNOSIS — L4050 Arthropathic psoriasis, unspecified: Secondary | ICD-10-CM | POA: Diagnosis present

## 2017-04-30 DIAGNOSIS — E11622 Type 2 diabetes mellitus with other skin ulcer: Secondary | ICD-10-CM

## 2017-04-30 DIAGNOSIS — R6510 Systemic inflammatory response syndrome (SIRS) of non-infectious origin without acute organ dysfunction: Secondary | ICD-10-CM | POA: Diagnosis present

## 2017-04-30 DIAGNOSIS — M25559 Pain in unspecified hip: Secondary | ICD-10-CM | POA: Diagnosis not present

## 2017-04-30 DIAGNOSIS — K5900 Constipation, unspecified: Secondary | ICD-10-CM

## 2017-04-30 DIAGNOSIS — Z961 Presence of intraocular lens: Secondary | ICD-10-CM | POA: Diagnosis present

## 2017-04-30 DIAGNOSIS — I480 Paroxysmal atrial fibrillation: Secondary | ICD-10-CM | POA: Diagnosis not present

## 2017-04-30 DIAGNOSIS — Z96653 Presence of artificial knee joint, bilateral: Secondary | ICD-10-CM | POA: Diagnosis present

## 2017-04-30 DIAGNOSIS — Z22330 Carrier of Group B streptococcus: Secondary | ICD-10-CM | POA: Diagnosis not present

## 2017-04-30 DIAGNOSIS — R296 Repeated falls: Secondary | ICD-10-CM | POA: Diagnosis present

## 2017-04-30 DIAGNOSIS — G8929 Other chronic pain: Secondary | ICD-10-CM | POA: Diagnosis present

## 2017-04-30 DIAGNOSIS — M8610 Other acute osteomyelitis, unspecified site: Secondary | ICD-10-CM | POA: Diagnosis not present

## 2017-04-30 LAB — PHOSPHORUS: Phosphorus: 4 mg/dL (ref 2.5–4.6)

## 2017-04-30 LAB — COMPREHENSIVE METABOLIC PANEL
ALBUMIN: 2.5 g/dL — AB (ref 3.5–5.0)
ALK PHOS: 109 U/L (ref 38–126)
ALT: 17 U/L (ref 14–54)
AST: 21 U/L (ref 15–41)
Anion gap: 8 (ref 5–15)
BILIRUBIN TOTAL: 0.4 mg/dL (ref 0.3–1.2)
BUN: 17 mg/dL (ref 6–20)
CALCIUM: 8.7 mg/dL — AB (ref 8.9–10.3)
CO2: 30 mmol/L (ref 22–32)
CREATININE: 0.79 mg/dL (ref 0.44–1.00)
Chloride: 101 mmol/L (ref 101–111)
GFR calc Af Amer: >60 mL/min (ref >60)
Glucose, Bld: 117 mg/dL — ABNORMAL HIGH (ref 65–99)
Potassium: 4.3 mmol/L (ref 3.5–5.1)
Sodium: 139 mmol/L (ref 135–145)
TOTAL PROTEIN: 5.9 g/dL — AB (ref 6.5–8.1)

## 2017-04-30 LAB — CBC
HEMATOCRIT: 28.6 % — AB (ref 36.0–46.0)
Hemoglobin: 8.9 g/dL — ABNORMAL LOW (ref 12.0–15.0)
MCH: 28.3 pg (ref 26.0–34.0)
MCHC: 31.1 g/dL (ref 30.0–36.0)
MCV: 90.8 fL (ref 78.0–100.0)
PLATELETS: 541 10*3/uL — AB (ref 150–400)
RBC: 3.15 MIL/uL — ABNORMAL LOW (ref 3.87–5.11)
RDW: 15.6 % — AB (ref 11.5–15.5)
WBC: 13.4 10*3/uL — AB (ref 4.0–10.5)

## 2017-04-30 LAB — MAGNESIUM: Magnesium: 1.8 mg/dL (ref 1.7–2.4)

## 2017-04-30 LAB — PROCALCITONIN: Procalcitonin: 0.59 ng/mL

## 2017-04-30 LAB — TSH: TSH: 1.335 u[IU]/mL (ref 0.350–4.500)

## 2017-04-30 LAB — LACTIC ACID, PLASMA
LACTIC ACID, VENOUS: 1.7 mmol/L (ref 0.5–1.9)
LACTIC ACID, VENOUS: 2 mmol/L — AB (ref 0.5–1.9)

## 2017-04-30 MED ORDER — DULOXETINE HCL 30 MG PO CPEP
60.0000 mg | ORAL_CAPSULE | Freq: Every day | ORAL | Status: DC
  Administered 2017-04-30: 60 mg via ORAL
  Filled 2017-04-30: qty 2

## 2017-04-30 MED ORDER — METHOCARBAMOL 500 MG PO TABS
500.0000 mg | ORAL_TABLET | Freq: Three times a day (TID) | ORAL | Status: DC | PRN
  Administered 2017-04-30 – 2017-05-04 (×2): 500 mg via ORAL
  Filled 2017-04-30 (×2): qty 1

## 2017-04-30 MED ORDER — LORAZEPAM 2 MG/ML IJ SOLN
0.5000 mg | Freq: Once | INTRAMUSCULAR | Status: AC | PRN
Start: 2017-04-30 — End: 2017-05-02
  Administered 2017-05-02: 0.5 mg via INTRAVENOUS
  Filled 2017-04-30: qty 1

## 2017-04-30 MED ORDER — GABAPENTIN 400 MG PO CAPS
1200.0000 mg | ORAL_CAPSULE | Freq: Every day | ORAL | Status: DC
  Administered 2017-05-01 – 2017-05-03 (×3): 1200 mg via ORAL
  Filled 2017-04-30 (×3): qty 3

## 2017-04-30 MED ORDER — ACETAMINOPHEN 650 MG RE SUPP: 650.0000 mg | Freq: Four times a day (QID) | RECTAL | Status: DC | PRN

## 2017-04-30 MED ORDER — METOPROLOL TARTRATE 25 MG PO TABS
12.5000 mg | ORAL_TABLET | Freq: Two times a day (BID) | ORAL | Status: DC
  Administered 2017-04-30: 12.5 mg via ORAL
  Filled 2017-04-30: qty 1

## 2017-04-30 MED ORDER — DIPHENHYDRAMINE HCL 50 MG/ML IJ SOLN
50.0000 mg | Freq: Four times a day (QID) | INTRAMUSCULAR | Status: DC | PRN
Start: 2017-04-30 — End: 2017-05-04
  Filled 2017-04-30: qty 1

## 2017-04-30 MED ORDER — LEVOFLOXACIN IN D5W 750 MG/150ML IV SOLN: 750.0000 mg | INTRAVENOUS | Status: DC

## 2017-04-30 MED ORDER — SODIUM CHLORIDE 0.9 % IV SOLN
INTRAVENOUS | Status: DC
  Administered 2017-04-30 (×2): via INTRAVENOUS

## 2017-04-30 MED ORDER — DULOXETINE HCL 60 MG PO CPEP
90.0000 mg | ORAL_CAPSULE | Freq: Every day | ORAL | Status: DC
  Administered 2017-05-01 – 2017-05-04 (×4): 90 mg via ORAL
  Filled 2017-04-30 (×4): qty 1

## 2017-04-30 MED ORDER — ONDANSETRON HCL 4 MG PO TABS: 4.0000 mg | ORAL_TABLET | Freq: Four times a day (QID) | ORAL | Status: DC | PRN

## 2017-04-30 MED ORDER — AZTREONAM IN DEXTROSE 2 GM/50ML IV SOLN
2.0000 g | Freq: Three times a day (TID) | INTRAVENOUS | Status: DC
  Administered 2017-04-30 – 2017-05-01 (×4): 2 g via INTRAVENOUS
  Filled 2017-04-30 (×6): qty 50

## 2017-04-30 MED ORDER — GABAPENTIN 300 MG PO CAPS
600.0000 mg | ORAL_CAPSULE | Freq: Every day | ORAL | Status: DC
  Administered 2017-04-30: 600 mg via ORAL
  Filled 2017-04-30: qty 2

## 2017-04-30 MED ORDER — ACETAMINOPHEN 325 MG PO TABS
650.0000 mg | ORAL_TABLET | Freq: Four times a day (QID) | ORAL | Status: DC | PRN
  Administered 2017-04-30 – 2017-05-03 (×4): 650 mg via ORAL
  Filled 2017-04-30 (×4): qty 2

## 2017-04-30 MED ORDER — GABAPENTIN 300 MG PO CAPS
600.0000 mg | ORAL_CAPSULE | Freq: Every day | ORAL | Status: DC
  Administered 2017-05-01 – 2017-05-04 (×4): 600 mg via ORAL
  Filled 2017-04-30 (×4): qty 2

## 2017-04-30 MED ORDER — HYDROCODONE-ACETAMINOPHEN 5-325 MG PO TABS
1.0000 | ORAL_TABLET | ORAL | Status: DC | PRN
  Administered 2017-05-02 – 2017-05-03 (×2): 1 via ORAL
  Administered 2017-05-04: 2 via ORAL
  Filled 2017-04-30 (×3): qty 1
  Filled 2017-04-30: qty 2

## 2017-04-30 MED ORDER — ONDANSETRON HCL 4 MG/2ML IJ SOLN: 4.0000 mg | Freq: Four times a day (QID) | INTRAMUSCULAR | Status: DC | PRN

## 2017-04-30 MED ORDER — GADOBENATE DIMEGLUMINE 529 MG/ML IV SOLN
15.0000 mL | Freq: Once | INTRAVENOUS | Status: AC | PRN
  Administered 2017-05-02: 15 mL via INTRAVENOUS

## 2017-04-30 MED ORDER — EPINEPHRINE PF 1 MG/10ML IJ SOSY
PREFILLED_SYRINGE | INTRAMUSCULAR | Status: AC
  Filled 2017-04-30: qty 10

## 2017-04-30 MED ORDER — DEXTROSE 5 % IV SOLN
250.0000 mg | Freq: Once | INTRAVENOUS | Status: AC
  Administered 2017-04-30: 250 mg via INTRAVENOUS
  Filled 2017-04-30: qty 250

## 2017-04-30 MED ORDER — DULOXETINE HCL 30 MG PO CPEP
30.0000 mg | ORAL_CAPSULE | Freq: Every day | ORAL | Status: DC
  Administered 2017-04-30: 30 mg via ORAL
  Filled 2017-04-30: qty 1

## 2017-04-30 MED ORDER — GABAPENTIN 400 MG PO CAPS
1200.0000 mg | ORAL_CAPSULE | Freq: Every day | ORAL | Status: DC
  Administered 2017-04-30: 1200 mg via ORAL
  Filled 2017-04-30: qty 3

## 2017-04-30 MED ORDER — HYDROCORTISONE NA SUCCINATE PF 100 MG IJ SOLR
100.0000 mg | Freq: Three times a day (TID) | INTRAMUSCULAR | Status: DC
  Administered 2017-04-30 – 2017-05-01 (×4): 100 mg via INTRAVENOUS
  Filled 2017-04-30 (×6): qty 2

## 2017-04-30 MED ORDER — VANCOMYCIN HCL IN DEXTROSE 750-5 MG/150ML-% IV SOLN
750.0000 mg | INTRAVENOUS | Status: DC
  Administered 2017-04-30 – 2017-05-01 (×2): 750 mg via INTRAVENOUS
  Filled 2017-04-30 (×2): qty 150

## 2017-04-30 MED ORDER — LORAZEPAM 2 MG/ML IJ SOLN
0.5000 mg | Freq: Once | INTRAMUSCULAR | Status: DC
  Filled 2017-04-30 (×2): qty 1

## 2017-04-30 NOTE — Consult Note (Signed)
[facility] for Infectious Disease    Date of Admission:  04/29/2017   Total days of antibiotics: 1 vanco/aztreonam/levaquin               Reason for Consult: Sepsis    Referring Provider: Maryland Pink   Assessment: Sepsis Thoracic discitis/osteo Trochanteric Bursitis Ischial spine osteomyelitis Recent group B strep bacteremia Heel decubitus Sacral Decubitus   Plan: 1. Await Bcx and UCx 2. Continue current anbx for now 3. Ask IR to eval for possible aspirate 4. Cephalosporin challenge while in ICU (discussed with pharm and nursing)  Comment- Would like to have sample of fluid collection if possible. Her repeat imaging of her hips does not mention her prev fluid collections.  Failure on po quinolone is not uncommon, it has numerous drug interactions which decrease it's absorption (cations).   Thank you so much for this interesting consult,  Active Problems:   Adrenal insufficiency (HCC)   PAF (paroxysmal atrial fibrillation) (HCC)   Hypotension   Systemic inflammatory response syndrome (SIRS) (HCC)   Stage I pressure ulcer of sacral region   SIRS (systemic inflammatory response syndrome) ([place])   . [START ON 05/01/2017] DULoxetine  90 mg Oral Daily  . [START ON 05/01/2017] gabapentin  1,200 mg Oral QHS  . [START ON 05/01/2017] gabapentin  600 mg Oral Daily  . hydrocortisone sodium succinate  100 mg Intravenous Q8H  . LORazepam  0.5 mg Intravenous Once  . metoprolol tartrate  12.5 mg Oral BID    HPI: Andrea Yu is a 77 y.o. female 77 y.o. female with hx of joint deformities due to OA, previous L3-5 fusion with rod and screws (2014), DM2 with neuropathy,, R calcaneal fusion 12-2015,  brought to the ED on 3-24 for altered mental status, low back pain for several days.  In ED she had temp 103, was found in to be in afib with RVR and had WBC 22.9. Her UA was notable for TNTC WBC. Lactate was 2.56. She was started on vanco/aztreonam (hx of throat swelling to PEN).    Due to her low back pain, she had CTA (aortic) and was found to have: Two RIGHT pelvic fluid collections: 6.6 x 4.2 x 7.2 cm lateral to and abutting the RIGHT greater trochanter question trochanteric bursitis; and a second collection posteriorly overlying and extending caudal to the RIGHT ischial spine 8.8 x 5.2 x >4.4 cm in size, with overlying skin thickening, subcutaneous infiltration, and potential mild ischial destruction, highly suspicious for osteomyelitis and overlying abscess, potentially related to decubitus ulcer?  Her BCx were positive GBS 2/2. She had TTE (3-26) that did not mention vegetation. Her UCx grew E coli (pan-sens).  She underwent IR aspirate of her ischial area on 3-24 and had drain placed in what was felt to be a hematoma. (Cx was -)  She was d/c to SNF on 3-29 on levaquin due to her drug allergies.   She returns on 4-5 with worsening pack pain and spasms. In ED she was hypotensenive and code "sepsis" was initiated. She was started on vanco/levaquin/aztreonam.  Her WBC was 18.3 and her temp has been normal.  She had imaging of her spine showing: discitis and osteomyelitis at T10-11. Paraspinous soft tissue edema. Epidural soft tissue thickening and small abscess on the right contributing to moderate spinal stenosis.  Drug allergy hx- she took "double dose of PEN prior to dental procedure" 5 (10?) years ago and had throat swelling. Since she  has only taken doxy for anbx.   Review of Systems: Review of Systems  Constitutional: Negative for chills and fever.  Respiratory: Negative for cough and shortness of breath.   Gastrointestinal: Positive for constipation. Negative for abdominal pain and diarrhea.  Genitourinary: Negative for dysuria.  Musculoskeletal: Positive for back pain.  Please see HPI. All other systems reviewed and negative.   Past Medical History:  Diagnosis Date  . A-fib ([place])    "just after knee 2nd OR" (11/07/2012)  . Anemia   . Arthritis     RA , SACROTIC ARTHRITIS  . Complication of anesthesia    found to be in afib when she woke up  . Dysrhythmia   . GERD (gastroesophageal reflux disease)   . Head injury, closed, with concussion   . History of blood transfusion    "after 1 of my knee ORs and today" (11/07/2012)  . Hypertension   . Peripheral neuropathy    "from back OR in 2005; in hands and feet" (11/07/2012)  . Psoriatic arthritis ([place])    on Prednisone Rx    Social History   Tobacco Use  . Smoking status: Former Smoker    Packs/day: 0.50    Years: 30.00    Pack years: 15.00    Types: Cigarettes    Last attempt to quit: 08/09/1981    Years since quitting: 35.7  . Smokeless tobacco: Never Used  Substance Use Topics  . Alcohol use: No  . Drug use: No    Family History  Problem Relation Age of Onset  . Heart attack Father        1951-deceased   . Liver disease Mother   . Aneurysm Son 11       Deceased      Medications:  Scheduled: . [START ON 05/01/2017] DULoxetine  90 mg Oral Daily  . [START ON 05/01/2017] gabapentin  1,200 mg Oral QHS  . [START ON 05/01/2017] gabapentin  600 mg Oral Daily  . hydrocortisone sodium succinate  100 mg Intravenous Q8H  . LORazepam  0.5 mg Intravenous Once  . metoprolol tartrate  12.5 mg Oral BID    Abtx:  Anti-infectives (From admission, onward)   Start     Dose/Rate Route Frequency Ordered Stop   05/01/17 1800  levofloxacin (LEVAQUIN) IVPB 750 mg     750 mg 100 mL/hr over 90 Minutes Intravenous Every 48 hours 04/30/17 0130     04/30/17 0800  vancomycin (VANCOCIN) IVPB 750 mg/150 ml premix     750 mg 150 mL/hr over 60 Minutes Intravenous Every 36 hours 04/30/17 0130     04/30/17 0400  aztreonam (AZACTAM) 2 GM IVPB     2 g 100 mL/hr over 30 Minutes Intravenous Every 8 hours 04/30/17 0130     04/29/17 1845  levofloxacin (LEVAQUIN) IVPB 750 mg     750 mg 100 mL/hr over 90 Minutes Intravenous  Once 04/29/17 1835 04/29/17 2215   04/29/17 1845  aztreonam (AZACTAM) 2 GM  IVPB     2 g 100 mL/hr over 30 Minutes Intravenous  Once 04/29/17 1835 04/29/17 2215   04/29/17 1845  vancomycin (VANCOCIN) IVPB 1000 mg/200 mL premix     1,000 mg 200 mL/hr over 60 Minutes Intravenous  Once 04/29/17 1835 04/29/17 2318        OBJECTIVE: Blood pressure 110/60, pulse 78, temperature 98.6 F (37 C), temperature source Oral, resp. rate (!) 23, height '4\' 11"'  (1.499 m), weight 70.3 kg (155 lb), SpO2 100 %.  Physical Exam  Constitutional: No distress.  HENT:  Mouth/Throat: No oropharyngeal exudate.  Eyes: Pupils are equal, round, and reactive to light. EOM are normal.  Neck: Neck supple.  Cardiovascular: Normal rate, regular rhythm and normal heart sounds.  Pulmonary/Chest: Effort normal and breath sounds normal.  Abdominal: Soft. Bowel sounds are normal. There is no tenderness. There is no rebound.  Musculoskeletal:       Back:       Feet:  Lymphadenopathy:    She has no cervical adenopathy.  Skin: She is not diaphoretic.    Lab Results Results for orders placed or performed during the hospital encounter of 04/29/17 (from the past 48 hour(s))  Sedimentation rate     Status: Abnormal   Collection Time: 04/29/17  5:54 PM  Result Value Ref Range   Sed Rate 69 (H) 0 - 22 mm/hr    Comment: Performed at [facility], [place] [address] [place], [place] [address]  CBC with Differential     Status: Abnormal   Collection Time: 04/29/17  5:59 PM  Result Value Ref Range   WBC 18.3 (H) 4.0 - 10.5 K/uL   RBC 3.40 (L) 3.87 - 5.11 MIL/uL   Hemoglobin 9.5 (L) 12.0 - 15.0 g/dL   HCT 30.8 (L) 36.0 - 46.0 %   MCV 90.6 78.0 - 100.0 fL   MCH 27.9 26.0 - 34.0 pg   MCHC 30.8 30.0 - 36.0 g/dL   RDW 15.7 (H) 11.5 - 15.5 %   Platelets 618 (H) 150 - 400 K/uL   Neutrophils Relative % 84 %   Neutro Abs 15.4 (H) 1.7 - 7.7 K/uL   Lymphocytes Relative 7 %   Lymphs Abs 1.3 0.7 - 4.0 K/uL   Monocytes Relative 7 %   Monocytes Absolute 1.3 (H) 0.1 - 1.0 K/uL    Eosinophils Relative 2 %   Eosinophils Absolute 0.3 0.0 - 0.7 K/uL   Basophils Relative 0 %   Basophils Absolute 0.0 0.0 - 0.1 K/uL    Comment: Performed at [facility], [place] [address] [place],  [address]  Comprehensive metabolic panel     Status: Abnormal   Collection Time: 04/29/17  5:59 PM  Result Value Ref Range   Sodium 138 135 - 145 mmol/L   Potassium 4.4 3.5 - 5.1 mmol/L   Chloride 99 (L) 101 - 111 mmol/L   CO2 27 22 - 32 mmol/L   Glucose, Bld 122 (H) 65 - 99 mg/dL   BUN 21 (H) 6 - 20 mg/dL   Creatinine, Ser 0.85 0.44 - 1.00 mg/dL   Calcium 9.7 8.9 - 10.3 mg/dL   Total Protein 6.1 (L) 6.5 - 8.1 g/dL   Albumin 2.6 (L) 3.5 - 5.0 g/dL   AST 29 15 - 41 U/L   ALT 17 14 - 54 U/L   Alkaline Phosphatase 110 38 - 126 U/L   Total Bilirubin 0.3 0.3 - 1.2 mg/dL   GFR calc non Af Amer >60 >60 mL/min   GFR calc Af Amer >60 >60 mL/min    Comment: (NOTE) The eGFR has been calculated using the CKD EPI equation. This calculation has not been validated in all clinical situations. eGFR's persistently <60 mL/min signify possible Chronic Kidney Disease.    Anion gap 12 5 - 15    Comment: Performed at [facility], [place] [address] [place], [place] [address]  C-reactive protein     Status: Abnormal   Collection Time:  04/29/17  6:35 PM  Result Value Ref Range   CRP 5.7 (H) <1.0 mg/dL    Comment: Performed at [facility] [address], [place], [place] [address]  Blood culture (routine x 2)     Status: None (Preliminary result)   Collection Time: 04/29/17  6:35 PM  Result Value Ref Range   Specimen Description      BLOOD RIGHT FOREARM Performed at [facility] [address] [place], [place] [address]    Special Requests      BOTTLES DRAWN AEROBIC AND ANAEROBIC Blood Culture adequate volume Performed at [facility] [address], [place], [place] [address]    Culture      NO GROWTH < 24  HOURS Performed at [facility] [address], [place], [place] [address]    Report Status PENDING   Protime-INR     Status: None   Collection Time: 04/29/17  6:35 PM  Result Value Ref Range   Prothrombin Time 15.0 11.4 - 15.2 seconds   INR 1.19     Comment: Performed at [facility], [place] [address], [place], [place] [address]  I-Stat CG4 Lactic Acid, ED     Status: Abnormal   Collection Time: 04/29/17  6:45 PM  Result Value Ref Range   Lactic Acid, Venous 2.55 (HH) 0.5 - 1.9 mmol/L   Comment NOTIFIED PHYSICIAN   I-Stat Chem 8, ED     Status: Abnormal   Collection Time: 04/29/17  6:45 PM  Result Value Ref Range   Sodium 139 135 - 145 mmol/L   Potassium 4.3 3.5 - 5.1 mmol/L   Chloride 100 (L) 101 - 111 mmol/L   BUN 17 6 - 20 mg/dL   Creatinine, Ser 0.90 0.44 - 1.00 mg/dL   Glucose, Bld 113 (H) 65 - 99 mg/dL   Calcium, Ion 1.29 1.15 - 1.40 mmol/L   TCO2 30 22 - 32 mmol/L   Hemoglobin 8.8 (L) 12.0 - 15.0 g/dL   HCT 26.0 (L) 36.0 - 46.0 %  Blood culture (routine x 2)     Status: None (Preliminary result)   Collection Time: 04/29/17  6:50 PM  Result Value Ref Range   Specimen Description      BLOOD RIGHT FOREARM Performed at [facility] [address], [place], [place] [address]    Special Requests      BOTTLES DRAWN AEROBIC AND ANAEROBIC Blood Culture adequate volume Performed at [facility] [address] [place], [place] [address]    Culture      NO GROWTH < 24 HOURS Performed at [facility] [address] [place], [place] [address]    Report Status PENDING   Urinalysis, Routine w reflex microscopic     Status: None   Collection Time: 04/29/17  9:00 PM  Result Value Ref Range   Color, Urine YELLOW YELLOW   APPearance CLEAR CLEAR   Specific Gravity, Urine 1.014 1.005 - 1.030   pH 7.0 5.0 - 8.0   Glucose, UA NEGATIVE NEGATIVE mg/dL   Hgb urine dipstick NEGATIVE NEGATIVE   Bilirubin Urine  NEGATIVE NEGATIVE   Ketones, ur NEGATIVE NEGATIVE mg/dL   Protein, ur NEGATIVE NEGATIVE mg/dL   Nitrite NEGATIVE NEGATIVE   Leukocytes, UA NEGATIVE NEGATIVE    Comment: Performed at [facility], [place] [address], [place], [place] [address]  I-Stat CG4 Lactic Acid, ED  Status: Abnormal   Collection Time: 04/29/17  9:46 PM  Result Value Ref Range   Lactic Acid, Venous 2.81 (HH) 0.5 - 1.9 mmol/L   Comment NOTIFIED PHYSICIAN   Lactic acid, plasma     Status: None   Collection Time: 04/30/17  3:12 AM  Result Value Ref Range   Lactic Acid, Venous 1.7 0.5 - 1.9 mmol/L    Comment: Performed at [facility], [place] [address], [place], [place] [address]  Procalcitonin     Status: None   Collection Time: 04/30/17  3:12 AM  Result Value Ref Range   Procalcitonin 0.59 ng/mL    Comment:        Interpretation: PCT > 0.5 ng/mL and <= 2 ng/mL: Systemic infection (sepsis) is possible, but other conditions are known to elevate PCT as well. (NOTE)       Sepsis PCT Algorithm           Lower Respiratory Tract                                      Infection PCT Algorithm    ----------------------------     ----------------------------         PCT < 0.25 ng/mL                PCT < 0.10 ng/mL         Strongly encourage             Strongly discourage   discontinuation of antibiotics    initiation of antibiotics    ----------------------------     -----------------------------       PCT 0.25 - 0.50 ng/mL            PCT 0.10 - 0.25 ng/mL               OR       >80% decrease in PCT            Discourage initiation of                                            antibiotics      Encourage discontinuation           of antibiotics    ----------------------------     -----------------------------         PCT >= 0.50 ng/mL              PCT 0.26 - 0.50 ng/mL                AND       <80% decrease in PCT             Encourage initiation of                                              antibiotics       Encourage continuation           of antibiotics    ----------------------------     -----------------------------        PCT >= 0.50 ng/mL  PCT > 0.50 ng/mL               AND         increase in PCT                  Strongly encourage                                      initiation of antibiotics    Strongly encourage escalation           of antibiotics                                     -----------------------------                                           PCT <= 0.25 ng/mL                                                 OR                                        > 80% decrease in PCT                                     Discontinue / Do not initiate                                             antibiotics Performed at [facility] [address] [place], [place] [address]   Magnesium     Status: None   Collection Time: 04/30/17  3:12 AM  Result Value Ref Range   Magnesium 1.8 1.7 - 2.4 mg/dL    Comment: Performed at [facility], [place] [address] [place], [place] [address]  Phosphorus     Status: None   Collection Time: 04/30/17  3:12 AM  Result Value Ref Range   Phosphorus 4.0 2.5 - 4.6 mg/dL    Comment: Performed at [facility], [place] [address], [place], [place] [address]  TSH     Status: None   Collection Time: 04/30/17  3:12 AM  Result Value Ref Range   TSH 1.335 0.350 - 4.500 uIU/mL    Comment: Performed by a 3rd Generation assay with a functional sensitivity of <=0.01 uIU/mL. Performed at [facility], [place] [address], [place], [place] [address]   Comprehensive metabolic panel     Status: Abnormal   Collection Time: 04/30/17  3:12 AM  Result Value Ref Range   Sodium 139 135 - 145 mmol/L   Potassium 4.3 3.5 - 5.1 mmol/L   Chloride 101 101 - 111 mmol/L   CO2 30 22 - 32 mmol/L   Glucose, Bld 117 (H) 65 - 99 mg/dL  BUN 17 6 - 20 mg/dL   Creatinine,  Ser 0.79 0.44 - 1.00 mg/dL   Calcium 8.7 (L) 8.9 - 10.3 mg/dL   Total Protein 5.9 (L) 6.5 - 8.1 g/dL   Albumin 2.5 (L) 3.5 - 5.0 g/dL   AST 21 15 - 41 U/L   ALT 17 14 - 54 U/L   Alkaline Phosphatase 109 38 - 126 U/L   Total Bilirubin 0.4 0.3 - 1.2 mg/dL   GFR calc non Af Amer >60 >60 mL/min   GFR calc Af Amer >60 >60 mL/min    Comment: (NOTE) The eGFR has been calculated using the CKD EPI equation. This calculation has not been validated in all clinical situations. eGFR's persistently <60 mL/min signify possible Chronic Kidney Disease.    Anion gap 8 5 - 15    Comment: Performed at [facility], [place] [address], [place], [place] [address]  CBC     Status: Abnormal   Collection Time: 04/30/17  3:12 AM  Result Value Ref Range   WBC 13.4 (H) 4.0 - 10.5 K/uL   RBC 3.15 (L) 3.87 - 5.11 MIL/uL   Hemoglobin 8.9 (L) 12.0 - 15.0 g/dL   HCT 28.6 (L) 36.0 - 46.0 %   MCV 90.8 78.0 - 100.0 fL   MCH 28.3 26.0 - 34.0 pg   MCHC 31.1 30.0 - 36.0 g/dL   RDW 15.6 (H) 11.5 - 15.5 %   Platelets 541 (H) 150 - 400 K/uL    Comment: Performed at [facility], [place] [address] [place], [place] [address]  Lactic acid, plasma     Status: Abnormal   Collection Time: 04/30/17  6:10 AM  Result Value Ref Range   Lactic Acid, Venous 2.0 (HH) 0.5 - 1.9 mmol/L    Comment: CRITICAL RESULT CALLED TO, READ BACK BY AND VERIFIED WITH: B SCOTT,RN 04/30/17 4193 RHOLMES Performed at [facility], [place] [address], [place], [place] [address]       Component Value Date/Time   SDES  04/29/2017 1850    BLOOD RIGHT FOREARM Performed at [facility] [address], [place], [place] [address]    SPECREQUEST  04/29/2017 1850    BOTTLES DRAWN AEROBIC AND ANAEROBIC Blood Culture adequate volume Performed at [facility], [place] [address], [place], [place] [address]    CULT  04/29/2017 1850    NO GROWTH < 24 HOURS Performed at  [facility] [address], [place], [place] [address]    REPTSTATUS PENDING 04/29/2017 1850   Dg Chest 2 View  Result Date: 04/29/2017 CLINICAL DATA:  Cough with abnormal right lower lobe physical exam. EXAM: CHEST - 2 VIEW COMPARISON:  04/17/2017 FINDINGS: Heart size is normal. There is aortic atherosclerosis. The lungs are clear. The vascularity is normal. No effusions. Chronic degenerative changes of the shoulder and spine as seen previously. IMPRESSION: No active disease by radiography. Specifically, no right lower lobe disease identified. Electronically Signed   By: Nelson Chimes M.D.   On: 04/29/2017 17:39   Mr Thoracic Spine Wo Contrast  Addendum Date: 04/30/2017   ADDENDUM REPORT: 04/30/2017 11:20 ADDENDUM: These results were called by telephone at the time of interpretation on 04/30/2017 at 11:19 am to Dr. Maryland Pink , who verbally acknowledged these results. Electronically Signed   By: Franchot Gallo M.D.   On: 04/30/2017 11:20   Result Date: 04/30/2017 CLINICAL DATA:  Back pain.  Abnormal x-ray. EXAM: MRI THORACIC  SPINE WITHOUT CONTRAST TECHNIQUE: Multiplanar, multisequence MR imaging of the thoracic spine was performed. No intravenous contrast was administered. COMPARISON:  Chest two-view 04/29/2017, chest CT 04/17/2017. CT abdomen pelvis 04/29/2017 FINDINGS: Alignment: Anterolisthesis T8-9. Mild retrolisthesis T5-6 and T6-7 and T12-L1. Vertebrae: Bone marrow edema throughout T10 and T11 centered at the disc space. Endplate erosions at the disc space with hyperintense signal in the disc. Bone erosion in the endplates is noted on recent CT and findings are compatible with discitis and osteomyelitis. Mild chronic compression fracture of T3. Hemangioma T5 vertebral body. No evidence of metastatic disease. Cord:  Negative for cord compression Paraspinal and other soft tissues: Small bilateral pleural effusions. Paraspinous soft tissue edema at T10-11 compatible with acute infection. Small  epidural fluid collection in the canal compatible with abscess. Disc levels: Image quality degraded by motion. Extensive disc degeneration and spondylosis throughout the thoracic spine. Central and left-sided spurring at T4-5 with mild spinal stenosis. Left-sided osteophyte T5-6 Central left-sided spurring T6-7. Disc and facet degeneration T8-9 with mild spinal stenosis Disc degeneration and spondylosis at T9-10. Diffuse bone marrow edema and endplate erosive changes at T10-11 compatible with infection. Paraspinous soft tissue edema. Ventral epidural soft tissue thickening and fluid collection on the right compatible with small epidural abscess. Cord flattening with moderate spinal stenosis. Mild degenerative change at T11-T12 IMPRESSION: Image quality degraded by motion. 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 Paraspinous soft tissue edema. Epidural soft tissue thickening and small abscess on the right contributing to moderate spinal stenosis. Electronically Signed: By: Franchot Gallo M.D. On: 04/30/2017 11:12   Mr Lumbar Spine Wo Contrast  Result Date: 04/30/2017 CLINICAL DATA:  Back pain greater than 6 weeks. Patient refused intravenous contrast. EXAM: MRI LUMBAR SPINE WITHOUT CONTRAST TECHNIQUE: Multiplanar, multisequence MR imaging of the lumbar spine was performed. No intravenous contrast was administered. COMPARISON:  Lumbar MRI 09/01/2015 FINDINGS: Segmentation:  Normal Alignment: Retrolisthesis T12-L1, L1-2, L3-4. 8 mm anterolisthesis L4-5. Vertebrae:  Negative for lumbar fracture or mass. Bone marrow edema involving T10 and T11 disc spaces with edema in the T10-11 disc space. Findings suggestive of discitis and osteomyelitis. This area is better evaluated on the thoracic MRI. Conus medullaris and cauda equina: Conus extends to the L1-2 level. Conus and cauda equina appear normal. Paraspinal and other soft  tissues: Paraspinous soft tissues normal. No fluid collection or mass Disc levels: T12-L1: Moderate disc and facet degeneration with mild spinal stenosis L1-2: Moderate disc and facet degeneration with moderate spinal stenosis and moderate foraminal stenosis bilaterally. Stenosis has progressed since the prior study. L2-3: Moderately large central and left-sided disc protrusion is unchanged. Bilateral facet hypertrophy. Severe spinal stenosis similar to the prior study L3-4: Pedicle screw fusion. Solid interbody fusion. Moderate foraminal stenosis bilaterally due to spurring L4-5: Pedicle screw fusion with 8 mm anterolisthesis similar to the prior study. Posterior decompression. Moderate foraminal encroachment bilaterally. L5-S1: Mild disc and facet degeneration. Image quality degraded by motion IMPRESSION: Suboptimal image quality due to motion There is bone marrow edema at T10-11 suggestive of discitis and osteomyelitis. See thoracic MRI report Pedicle screw fusion L3-4 L4-5. There is significant foraminal encroachment at both levels due to spurring Central left-sided disc protrusion L2-3 with severe spinal stenosis. Mild spinal stenosis T12-L1 and moderate spinal stenosis L1-2 Electronically Signed   By: Franchot Gallo M.D.   On: 04/30/2017 11:03   Ct Abdomen Pelvis W Contrast  Result Date: 04/29/2017 CLINICAL DATA:  Back spasms  for 2 weeks worsening today. Nausea and vomiting. Abdominal distention. EXAM: CT ABDOMEN AND PELVIS WITH CONTRAST TECHNIQUE: Multidetector CT imaging of the abdomen and pelvis was performed using the standard protocol following bolus administration of intravenous contrast. CONTRAST:  143m ISOVUE-300 IOPAMIDOL (ISOVUE-300) INJECTION 61% COMPARISON:  04/17/2017 FINDINGS: Lower chest: Right coronary artery atherosclerotic calcification. Small but new left pleural effusion. Hepatobiliary: Hypodensity in segment 4 of the liver along the falciform ligament most likely due to focal fatty  infiltration. Cholecystectomy. Pancreas: Unremarkable Spleen: Small hypodensities in the spleen are nonspecific and may be related to the relatively early phase of contrast on the initial acquisition series. These are not well seen on the more delayed series. Adrenals/Urinary Tract: The adrenal glands appear normal. Exophytic 10 by 9 mm right kidney upper pole lesion measures at fluid density on image 29/2 and is probably a benign cyst. Stomach/Bowel: Sigmoid colon diverticulosis without active diverticulitis. Vascular/Lymphatic: Aortoiliac atherosclerotic vascular disease. No pathologic adenopathy. Reproductive: Uterus absent.  Adnexa unremarkable. Other: No supplemental non-categorized findings. Musculoskeletal: Complex fluid collection extending posteriorly from the right ischial tuberosity with mildly enhancing margins. On the lowest image this associated fluid collection measures 5.6 by 5.4 cm, smaller than on 04/17/2017 but still substantial. There is a truncated/shaved appearance of the posterior margin of the ischial tuberosity. A smaller right trochanteric bursal fluid collection has mildly enhancing margins and measures 3.6 by 1.5 by 7.1 cm (volume = 20 cm^3). Again, this is smaller than on 04/17/2017, but has undergone interval drainage. Residual infection not excluded. Lower thoracic and lumbar spondylosis and degenerative disc disease with posterolateral rod and pedicle screw fixation at L3-L4-L5. Chronic grade 1 anterolisthesis at L4-5 and interbody fusion at L3-4. Continued loss of disc space most striking at L2-3 and T12-L1. Stable endplate irregularities in the lower thoracic spine and at L1-2. Periumbilical hernias contain omental adipose tissue. IMPRESSION: 1. Abnormal fluid collections in the right trochanteric bursa and below the right ischium, with some irregularity of the right ischial tuberosity stable from the prior exam. By report, the prior fluid cultures of both of these collections  aspirated on 04/17/2017 were negative for organisms. Both of these collections are smaller than on 04/17/2017, although still substantial. Stable truncated/shaved appearance of the right ischial tuberosity. 2. 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 Expectation for lumbar impingement at the L3-4 and L4-5 levels, and on the left at L5-S1. There is likely also impingement at L1-2, L2-3, and T10-11. 3. Small but new left pleural effusion. 4. No cause for abdominal distention is identified. No dilated bowel. 5. Other imaging findings of potential clinical significance: Aortic Atherosclerosis (ICD10-I70.0). Coronary atherosclerosis. Sigmoid diverticulosis without active diverticulitis. Electronically Signed   By: WVan ClinesM.D.   On: 04/29/2017 20:54   Recent Results (from the past 240 hour(s))  Blood culture (routine x 2)     Status: None (Preliminary result)   Collection Time: 04/29/17  6:35 PM  Result Value Ref Range Status   Specimen Description   Final    BLOOD RIGHT FOREARM Performed at [facility] [place] [id number]   Special Requests   Final    BOTTLES DRAWN AEROBIC AND ANAEROBIC Blood Culture adequate volume Performed at [facility] [place] [address]   Culture   Final    NO GROWTH < 24 HOURS Performed at [facility],  [place], [place] [address]    Report Status PENDING  Incomplete  Blood culture (routine x 2)     Status: None (Preliminary result)   Collection Time: 04/29/17  6:50 PM  Result Value Ref Range Status   Specimen Description   Final    BLOOD RIGHT FOREARM Performed at [facility] [address], [place], [place] [address]    Special Requests   Final    BOTTLES DRAWN AEROBIC AND ANAEROBIC Blood Culture adequate volume Performed at [facility] [address] [place], [place] [address]    Culture   Final    NO GROWTH < 24 HOURS Performed at [facility] [address] [place], [place] [address]    Report Status PENDING  Incomplete    Microbiology: Recent Results (from the past 240 hour(s))  Blood culture (routine x 2)     Status: None (Preliminary result)   Collection Time: 04/29/17  6:35 PM  Result Value Ref Range Status   Specimen Description   Final    BLOOD RIGHT FOREARM Performed at [facility] [place] [address] [place], [place] [address]    Special Requests   Final    BOTTLES DRAWN AEROBIC AND ANAEROBIC Blood Culture adequate volume Performed at [facility] [address] [place], [place] [address]    Culture   Final    NO GROWTH < 24 HOURS Performed at [facility] [address], [place], [place] [address]    Report Status PENDING  Incomplete  Blood culture (routine x 2)     Status: None (Preliminary result)   Collection Time: 04/29/17  6:50 PM  Result Value Ref Range Status   Specimen Description   Final    BLOOD RIGHT FOREARM Performed at [facility] [address], [place], [place] [address]    Special Requests   Final    BOTTLES DRAWN AEROBIC AND ANAEROBIC Blood Culture adequate volume Performed at [facility] [address] [place], [place] [address]    Culture   Final    NO GROWTH < 24 HOURS Performed at [facility] [address], [place], [place] [address]    Report Status PENDING  Incomplete    Radiographs and labs were personally reviewed by me.   Bobby Rumpf, MD [facility] [phone] 04/30/2017, 2:12 PM

## 2017-04-30 NOTE — Consult Note (Signed)
Reason for Consult: T10 diskitis Referring Physician: Dr. Martyn Malay is an 77 y.o. female.   HPI:  77 year old patient presents today with thoracic back pain. She had recent surgery on an abscess on her right heel. She became septic from this and was admitted to [facility] ICU. She complains of thoracic back pain and tenderness upon palpation. She is a patient of Dr. Gae Dry. Denies any pain NT or W down her legs. She has not been walking bc of the abscess on her heel but she is able to move her legs very well.   Past Medical History:  Diagnosis Date  . A-fib (HCC)    "just after knee 2nd OR" (11/07/2012)  . Anemia   . Arthritis    RA , SACROTIC ARTHRITIS  . Complication of anesthesia    found to be in afib when she woke up  . Dysrhythmia   . GERD (gastroesophageal reflux disease)   . Head injury, closed, with concussion   . History of blood transfusion    "after 1 of my knee ORs and today" (11/07/2012)  . Hypertension   . Peripheral neuropathy    "from back OR in 2005; in hands and feet" (11/07/2012)  . Psoriatic arthritis (HCC)    on Prednisone Rx    Past Surgical History:  Procedure Laterality Date  . ANKLE FUSION Right 01/07/2016   Procedure: Right Tibiocalcaneal Fusion;  Surgeon: Nadara Mustard, MD;  Location: [facility] OR;  Service: Orthopedics;  Laterality: Right;  . BACK SURGERY    . CATARACT EXTRACTION W/ INTRAOCULAR LENS  IMPLANT, BILATERAL Bilateral 2007  . CHOLECYSTECTOMY  2000's  . COLONOSCOPY N/A 11/10/2012   Procedure: COLONOSCOPY;  Surgeon: Theda Belfast, MD;  Location: [facility] ENDOSCOPY;  Service: Endoscopy;  Laterality: N/A;  . ESOPHAGOGASTRODUODENOSCOPY N/A 11/10/2012   Procedure: ESOPHAGOGASTRODUODENOSCOPY (EGD);  Surgeon: Theda Belfast, MD;  Location: [facility] ENDOSCOPY;  Service: Endoscopy;  Laterality: N/A;  . EYE SURGERY    . JOINT REPLACEMENT    . POSTERIOR LUMBAR FUSION  2005   "got a rod and 4 pins in there" (11/07/2012)  . REPLACEMENT TOTAL KNEE BILATERAL  Bilateral 2007  . TRANSTHORACIC ECHOCARDIOGRAM  05/24/2006  . TUBAL LIGATION  1974  . VAGINAL HYSTERECTOMY  1983    Allergies  Allergen Reactions  . Penicillins Anaphylaxis    THROAT SWELLING  Has patient had a PCN reaction causing immediate rash, facial/tongue/throat swelling, SOB or lightheadedness with hypotension:  # # YES # #  Has patient had a PCN reaction causing severe rash involving mucus membranes or skin necrosis: # # # YES # # # Has patient had a PCN reaction that required hospitalization:No Has patient had a PCN reaction occurring within the last 10 years:  # # YES # #  If all of the above answers are "NO", then may proceed with Cephalosporin use.   Jake Seats [Denosumab] Other (See Comments)    Severe Pain in the groin area.  Marland Kitchen Fosamax [Alendronate Sodium]     UNSPECIFIED REACTION     Social History   Tobacco Use  . Smoking status: Former Smoker    Packs/day: 0.50    Years: 30.00    Pack years: 15.00    Types: Cigarettes    Last attempt to quit: 08/09/1981    Years since quitting: 35.7  . Smokeless tobacco: Never Used  Substance Use Topics  . Alcohol use: No    Family History  Problem Relation Age  of Onset  . Heart attack Father        1951-deceased   . Liver disease Mother   . Aneurysm Son 67       Deceased      Review of Systems  Positive ROS: thoracic back pain  All other systems have been reviewed and were otherwise negative with the exception of those mentioned in the HPI and as above.  Objective: Vital signs in last 24 hours: Temp:  [97.6 F (36.4 C)-98.5 F (36.9 C)] 97.6 F (36.4 C) (04/06 0800) Pulse Rate:  [60-98] 60 (04/06 0600) Resp:  [11-19] 13 (04/06 0600) BP: (80-127)/(35-95) 120/48 (04/06 0600) SpO2:  [99 %-100 %] 100 % (04/06 0600) Weight:  [70.3 kg (155 lb)] 70.3 kg (155 lb) (04/05 1842)  General Appearance: Alert, cooperative, no distress, appears stated age Head: Normocephalic, without obvious abnormality, atraumatic Back:  Symmetric, no curvature, ROM normal, no CVA tenderness, tenderness over thoracic spine Lungs:  respirations unlabored Heart: Regular rate and rhythm Extremities: Extremities normal, atraumatic, no cyanosis or edema Pulses: 2+ and symmetric all extremities Skin: Skin color, texture, turgor normal, no rashes or lesions  NEUROLOGIC:   Mental status: A&O x4, no aphasia, good attention span, Memory and fund of knowledge Motor Exam - grossly normal, normal tone and bulk Sensory Exam - grossly normal Reflexes: symmetric, no pathologic reflexes, No Hoffman's, No clonus Coordination - grossly normal Gait - not tested Balance -not tested Cranial Nerves: I: smell Not tested  II: visual acuity  OS: na   OD: na  II: visual fields Full to confrontation  II: pupils   III,VII: ptosis None  III,IV,VI: extraocular muscles  Full ROM  V: mastication   V: facial light touch sensation    V,VII: corneal reflex    VII: facial muscle function - upper    VII: facial muscle function - lower   VIII: hearing   IX: soft palate elevation    IX,X: gag reflex   XI: trapezius strength    XI: sternocleidomastoid strength   XI: neck flexion strength    XII: tongue strength      Data Review Lab Results  Component Value Date   WBC 13.4 (H) 04/30/2017   HGB 8.9 (L) 04/30/2017   HCT 28.6 (L) 04/30/2017   MCV 90.8 04/30/2017   PLT 541 (H) 04/30/2017   Lab Results  Component Value Date   NA 139 04/30/2017   K 4.3 04/30/2017   CL 101 04/30/2017   CO2 30 04/30/2017   BUN 17 04/30/2017   CREATININE 0.79 04/30/2017   GLUCOSE 117 (H) 04/30/2017   Lab Results  Component Value Date   INR 1.19 04/29/2017    Radiology: Dg Chest 2 View  Result Date: 04/29/2017 CLINICAL DATA:  Cough with abnormal right lower lobe physical exam. EXAM: CHEST - 2 VIEW COMPARISON:  04/17/2017 FINDINGS: Heart size is normal. There is aortic atherosclerosis. The lungs are clear. The vascularity is normal. No effusions. Chronic  degenerative changes of the shoulder and spine as seen previously. IMPRESSION: No active disease by radiography. Specifically, no right lower lobe disease identified. Electronically Signed   By: Paulina Fusi M.D.   On: 04/29/2017 17:39   Mr Thoracic Spine Wo Contrast  Addendum Date: 04/30/2017   ADDENDUM REPORT: 04/30/2017 11:20 ADDENDUM: These results were called by telephone at the time of interpretation on 04/30/2017 at 11:19 am to Dr. Rito Ehrlich , who verbally acknowledged these results. Electronically Signed   By: Marlan Palau M.D.  On: 04/30/2017 11:20   Result Date: 04/30/2017 CLINICAL DATA:  Back pain.  Abnormal x-ray. EXAM: MRI THORACIC SPINE WITHOUT CONTRAST TECHNIQUE: Multiplanar, multisequence MR imaging of the thoracic spine was performed. No intravenous contrast was administered. COMPARISON:  Chest two-view 04/29/2017, chest CT 04/17/2017. CT abdomen pelvis 04/29/2017 FINDINGS: Alignment: Anterolisthesis T8-9. Mild retrolisthesis T5-6 and T6-7 and T12-L1. Vertebrae: Bone marrow edema throughout T10 and T11 centered at the disc space. Endplate erosions at the disc space with hyperintense signal in the disc. Bone erosion in the endplates is noted on recent CT and findings are compatible with discitis and osteomyelitis. Mild chronic compression fracture of T3. Hemangioma T5 vertebral body. No evidence of metastatic disease. Cord:  Negative for cord compression Paraspinal and other soft tissues: Small bilateral pleural effusions. Paraspinous soft tissue edema at T10-11 compatible with acute infection. Small epidural fluid collection in the canal compatible with abscess. Disc levels: Image quality degraded by motion. Extensive disc degeneration and spondylosis throughout the thoracic spine. Central and left-sided spurring at T4-5 with mild spinal stenosis. Left-sided osteophyte T5-6 Central left-sided spurring T6-7. Disc and facet degeneration T8-9 with mild spinal stenosis Disc degeneration and  spondylosis at T9-10. Diffuse bone marrow edema and endplate erosive changes at T10-11 compatible with infection. Paraspinous soft tissue edema. Ventral epidural soft tissue thickening and fluid collection on the right compatible with small epidural abscess. Cord flattening with moderate spinal stenosis. Mild degenerative change at T11-T12 IMPRESSION: Image quality degraded by motion. 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 Paraspinous soft tissue edema. Epidural soft tissue thickening and small abscess on the right contributing to moderate spinal stenosis. Electronically Signed: By: Marlan Palau M.D. On: 04/30/2017 11:12   Mr Lumbar Spine Wo Contrast  Result Date: 04/30/2017 CLINICAL DATA:  Back pain greater than 6 weeks. Patient refused intravenous contrast. EXAM: MRI LUMBAR SPINE WITHOUT CONTRAST TECHNIQUE: Multiplanar, multisequence MR imaging of the lumbar spine was performed. No intravenous contrast was administered. COMPARISON:  Lumbar MRI 09/01/2015 FINDINGS: Segmentation:  Normal Alignment: Retrolisthesis T12-L1, L1-2, L3-4. 8 mm anterolisthesis L4-5. Vertebrae:  Negative for lumbar fracture or mass. Bone marrow edema involving T10 and T11 disc spaces with edema in the T10-11 disc space. Findings suggestive of discitis and osteomyelitis. This area is better evaluated on the thoracic MRI. Conus medullaris and cauda equina: Conus extends to the L1-2 level. Conus and cauda equina appear normal. Paraspinal and other soft tissues: Paraspinous soft tissues normal. No fluid collection or mass Disc levels: T12-L1: Moderate disc and facet degeneration with mild spinal stenosis L1-2: Moderate disc and facet degeneration with moderate spinal stenosis and moderate foraminal stenosis bilaterally. Stenosis has progressed since the prior study. L2-3: Moderately large central and left-sided disc protrusion is unchanged.  Bilateral facet hypertrophy. Severe spinal stenosis similar to the prior study L3-4: Pedicle screw fusion. Solid interbody fusion. Moderate foraminal stenosis bilaterally due to spurring L4-5: Pedicle screw fusion with 8 mm anterolisthesis similar to the prior study. Posterior decompression. Moderate foraminal encroachment bilaterally. L5-S1: Mild disc and facet degeneration. Image quality degraded by motion IMPRESSION: Suboptimal image quality due to motion There is bone marrow edema at T10-11 suggestive of discitis and osteomyelitis. See thoracic MRI report Pedicle screw fusion L3-4 L4-5. There is significant foraminal encroachment at both levels due to spurring Central left-sided disc protrusion L2-3 with severe spinal stenosis. Mild spinal stenosis T12-L1 and moderate spinal stenosis L1-2 Electronically Signed   By: Marlan Palau M.D.  On: 04/30/2017 11:03   Ct Abdomen Pelvis W Contrast  Result Date: 04/29/2017 CLINICAL DATA:  Back spasms for 2 weeks worsening today. Nausea and vomiting. Abdominal distention. EXAM: CT ABDOMEN AND PELVIS WITH CONTRAST TECHNIQUE: Multidetector CT imaging of the abdomen and pelvis was performed using the standard protocol following bolus administration of intravenous contrast. CONTRAST:  ISOVUE-300 IOPAMIDOL (ISOVUE-300) INJECTION 61% COMPARISON:  04/17/2017 FINDINGS: Lower chest: Right coronary artery atherosclerotic calcification. Small but new left pleural effusion. Hepatobiliary: Hypodensity in segment 4 of the liver along the falciform ligament most likely due to focal fatty infiltration. Cholecystectomy. Pancreas: Unremarkable Spleen: Small hypodensities in the spleen are nonspecific and may be related to the relatively early phase of contrast on the initial acquisition series. These are not well seen on the more delayed series. Adrenals/Urinary Tract: The adrenal glands appear normal. Exophytic 10 by 9 mm right kidney upper pole lesion measures at fluid density on  image 29/2 and is probably a benign cyst. Stomach/Bowel: Sigmoid colon diverticulosis without active diverticulitis. Vascular/Lymphatic: Aortoiliac atherosclerotic vascular disease. No pathologic adenopathy. Reproductive: Uterus absent.  Adnexa unremarkable. Other: No supplemental non-categorized findings. Musculoskeletal: Complex fluid collection extending posteriorly from the right ischial tuberosity with mildly enhancing margins. On the lowest image this associated fluid collection measures 5.6 by 5.4 cm, smaller than on 04/17/2017 but still substantial. There is a truncated/shaved appearance of the posterior margin of the ischial tuberosity. A smaller right trochanteric bursal fluid collection has mildly enhancing margins and measures 3.6 by 1.5 by 7.1 cm (volume = 20 cm^3). Again, this is smaller than on 04/17/2017, but has undergone interval drainage. Residual infection not excluded. Lower thoracic and lumbar spondylosis and degenerative disc disease with posterolateral rod and pedicle screw fixation at L3-L4-L5. Chronic grade 1 anterolisthesis at L4-5 and interbody fusion at L3-4. Continued loss of disc space most striking at L2-3 and T12-L1. Stable endplate irregularities in the lower thoracic spine and at L1-2. Periumbilical hernias contain omental adipose tissue. IMPRESSION: 1. Abnormal fluid collections in the right trochanteric bursa and below the right ischium, with some irregularity of the right ischial tuberosity stable from the prior exam. By report, the prior fluid cultures of both of these collections aspirated on 04/17/2017 were negative for organisms. Both of these collections are smaller than on 04/17/2017, although still substantial. Stable truncated/shaved appearance of the right ischial tuberosity. 2. 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 Expectation for lumbar impingement at the L3-4 and L4-5 levels, and on the  left at L5-S1. There is likely also impingement at L1-2, L2-3, and T10-11. 3. Small but new left pleural effusion. 4. No cause for abdominal distention is identified. No dilated bowel. 5. Other imaging findings of potential clinical significance: Aortic Atherosclerosis (ICD10-I70.0). Coronary atherosclerosis. Sigmoid diverticulosis without active diverticulitis. Electronically Signed   By: Gaylyn Rong M.D.   On: 04/29/2017 20:54    Assessment/Plan: 77 year old that was recently admitted for sepsis also complained of back pain. She does have a ventral fluid collection at T10 with diskitis. The stenosis is not likely from this fluid collection but rather spondylosis. Would recommend 6 weeks of IV therapy per ID. This will be best treated with medical management. No surgical intervention needed at this time. Please call if we can be of further assistance.   Tiana Loft Shritha Bresee 04/30/2017 12:25 PM

## 2017-04-30 NOTE — Progress Notes (Signed)
[facility] PROGRESS NOTE  Andrea Yu WUJ:[id number] DOB: Jul 28, 1940 DOA: 04/29/2017  PCP: Jarome Matin, MD  Brief History/Interval Summary: 77 year old Caucasian female with a past medical history of atrial fibrillation not on anticoagulation due to recurrent falls and previous history of intracranial hemorrhage, history of peripheral neuropathy, psoriatic arthritis on chronic steroids, hypertension, chronic pain, severe osteoporosis, recently hospitalized for management of abscess detected in the right lower extremity along right trochanteric bursa, and right posterior tuberosity with a right femoral head osteomyelitis.  During the last hospitalization this fluid collection was drained.  It was negative for any growth of bacteria.  It was thought to be hematoma.  She had blood cultures positive for group B strep.  Infectious disease was consulted.  She was discharged on prolonged course of oral Levaquin.  Echocardiogram done at that time did not show any vegetation.  Urine cultures grew E. coli.  She was discharged to skilled nursing facility and presented back to worsening back pain.  Found to be septic and was hospitalized.  Reason for Visit: T10-11 discitis  Consultants: We will consult infectious disease.  We will also discuss with either patient's back surgeon or neurosurgeon  Procedures: None  Antibiotics: Currently on vancomycin and Levaquin and aztreonam  Subjective/Interval History: Patient states that she is feeling slightly better today compared to last night.  Denies any chest pain shortness of breath dizziness or lightheadedness.  Back pain is reasonably well controlled.  ROS: Denies any nausea or vomiting  Objective:  Vital Signs  Vitals:   04/30/17 0400 04/30/17 0500 04/30/17 0600 04/30/17 0800  BP: 127/60 (!) 120/52 (!) 120/48   Pulse: 68 70 60   Resp: 13 13 13    Temp: 98 F (36.7 C)   97.6 F (36.4 C)  TempSrc: Oral   Axillary  SpO2: 100% 100%  100%   Weight:      Height:        Intake/Output Summary (Last 24 hours) at 04/30/2017 1123 Last data filed at 04/30/2017 0900 Gross per 24 hour  Intake 2296.67 ml  Output 3700 ml  Net -1403.33 ml   Filed Weights   04/29/17 1842  Weight: 70.3 kg (155 lb)    General appearance: alert, cooperative, appears stated age and no distress Head: Normocephalic, without obvious abnormality, atraumatic Resp: clear to auscultation bilaterally Cardio: regular rate and rhythm, S1, S2 normal, no murmur, click, rub or gallop GI: soft, non-tender; bowel sounds normal; no masses,  no organomegaly Extremities: extremities normal, atraumatic, no cyanosis or edema Neurologic: Alert and oriented x3.  Cranial nerves II through XII intact.  Motor strength equal bilateral upper and lower extremities.  Able to lift both legs off the bed.  Lab Results:  Data Reviewed: I have personally reviewed following labs and imaging studies  CBC: Recent Labs  Lab 04/29/17 1759 04/29/17 1845 04/30/17 0312  WBC 18.3*  --  13.4*  NEUTROABS 15.4*  --   --   HGB 9.5* 8.8* 8.9*  HCT 30.8* 26.0* 28.6*  MCV 90.6  --  90.8  PLT 618*  --  541*    Basic Metabolic Panel: Recent Labs  Lab 04/29/17 1759 04/29/17 1845 04/30/17 0312  NA 138 139 139  K 4.4 4.3 4.3  CL 99* 100* 101  CO2 27  --  30  GLUCOSE 122* 113* 117*  BUN 21* 17 17  CREATININE 0.85 0.90 0.79  CALCIUM 9.7  --  8.7*  MG  --   --  1.8  PHOS  --   --  4.0    GFR: Estimated Creatinine Clearance: 49.4 mL/min (by C-G formula based on SCr of 0.79 mg/dL).  Liver Function Tests: Recent Labs  Lab 04/29/17 1759 04/30/17 0312  AST 29 21  ALT 17 17  ALKPHOS 110 109  BILITOT 0.3 0.4  PROT 6.1* 5.9*  ALBUMIN 2.6* 2.5*     Coagulation Profile: Recent Labs  Lab 04/29/17 1835  INR 1.19    Thyroid Function Tests: Recent Labs    04/30/17 0312  TSH 1.335    Radiology Studies: Dg Chest 2 View  Result Date: 04/29/2017 CLINICAL DATA:   Cough with abnormal right lower lobe physical exam. EXAM: CHEST - 2 VIEW COMPARISON:  04/17/2017 FINDINGS: Heart size is normal. There is aortic atherosclerosis. The lungs are clear. The vascularity is normal. No effusions. Chronic degenerative changes of the shoulder and spine as seen previously. IMPRESSION: No active disease by radiography. Specifically, no right lower lobe disease identified. Electronically Signed   By: Paulina Fusi M.D.   On: 04/29/2017 17:39   Mr Thoracic Spine Wo Contrast  Addendum Date: 04/30/2017   ADDENDUM REPORT: 04/30/2017 11:20 ADDENDUM: These results were called by telephone at the time of interpretation on 04/30/2017 at 11:19 am to Dr. Rito Ehrlich , who verbally acknowledged these results. Electronically Signed   By: Marlan Palau M.D.   On: 04/30/2017 11:20   Result Date: 04/30/2017 CLINICAL DATA:  Back pain.  Abnormal x-ray. EXAM: MRI THORACIC SPINE WITHOUT CONTRAST TECHNIQUE: Multiplanar, multisequence MR imaging of the thoracic spine was performed. No intravenous contrast was administered. COMPARISON:  Chest two-view 04/29/2017, chest CT 04/17/2017. CT abdomen pelvis 04/29/2017 FINDINGS: Alignment: Anterolisthesis T8-9. Mild retrolisthesis T5-6 and T6-7 and T12-L1. Vertebrae: Bone marrow edema throughout T10 and T11 centered at the disc space. Endplate erosions at the disc space with hyperintense signal in the disc. Bone erosion in the endplates is noted on recent CT and findings are compatible with discitis and osteomyelitis. Mild chronic compression fracture of T3. Hemangioma T5 vertebral body. No evidence of metastatic disease. Cord:  Negative for cord compression Paraspinal and other soft tissues: Small bilateral pleural effusions. Paraspinous soft tissue edema at T10-11 compatible with acute infection. Small epidural fluid collection in the canal compatible with abscess. Disc levels: Image quality degraded by motion. Extensive disc degeneration and spondylosis throughout the  thoracic spine. Central and left-sided spurring at T4-5 with mild spinal stenosis. Left-sided osteophyte T5-6 Central left-sided spurring T6-7. Disc and facet degeneration T8-9 with mild spinal stenosis Disc degeneration and spondylosis at T9-10. Diffuse bone marrow edema and endplate erosive changes at T10-11 compatible with infection. Paraspinous soft tissue edema. Ventral epidural soft tissue thickening and fluid collection on the right compatible with small epidural abscess. Cord flattening with moderate spinal stenosis. Mild degenerative change at T11-T12 IMPRESSION: Image quality degraded by motion. 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 Paraspinous soft tissue edema. Epidural soft tissue thickening and small abscess on the right contributing to moderate spinal stenosis. Electronically Signed: By: Marlan Palau M.D. On: 04/30/2017 11:12   Mr Lumbar Spine Wo Contrast  Result Date: 04/30/2017 CLINICAL DATA:  Back pain greater than 6 weeks. Patient refused intravenous contrast. EXAM: MRI LUMBAR SPINE WITHOUT CONTRAST TECHNIQUE: Multiplanar, multisequence MR imaging of the lumbar spine was performed. No intravenous contrast was administered. COMPARISON:  Lumbar MRI 09/01/2015 FINDINGS: Segmentation:  Normal Alignment: Retrolisthesis T12-L1, L1-2, L3-4. 8 mm anterolisthesis L4-5. Vertebrae:  Negative for lumbar fracture or mass. Bone marrow edema involving T10 and T11 disc spaces with edema in the T10-11 disc space. Findings suggestive of discitis and osteomyelitis. This area is better evaluated on the thoracic MRI. Conus medullaris and cauda equina: Conus extends to the L1-2 level. Conus and cauda equina appear normal. Paraspinal and other soft tissues: Paraspinous soft tissues normal. No fluid collection or mass Disc levels: T12-L1: Moderate disc and facet degeneration with mild spinal stenosis L1-2: Moderate disc  and facet degeneration with moderate spinal stenosis and moderate foraminal stenosis bilaterally. Stenosis has progressed since the prior study. L2-3: Moderately large central and left-sided disc protrusion is unchanged. Bilateral facet hypertrophy. Severe spinal stenosis similar to the prior study L3-4: Pedicle screw fusion. Solid interbody fusion. Moderate foraminal stenosis bilaterally due to spurring L4-5: Pedicle screw fusion with 8 mm anterolisthesis similar to the prior study. Posterior decompression. Moderate foraminal encroachment bilaterally. L5-S1: Mild disc and facet degeneration. Image quality degraded by motion IMPRESSION: Suboptimal image quality due to motion There is bone marrow edema at T10-11 suggestive of discitis and osteomyelitis. See thoracic MRI report Pedicle screw fusion L3-4 L4-5. There is significant foraminal encroachment at both levels due to spurring Central left-sided disc protrusion L2-3 with severe spinal stenosis. Mild spinal stenosis T12-L1 and moderate spinal stenosis L1-2 Electronically Signed   By: Marlan Palau M.D.   On: 04/30/2017 11:03   Ct Abdomen Pelvis W Contrast  Result Date: 04/29/2017 CLINICAL DATA:  Back spasms for 2 weeks worsening today. Nausea and vomiting. Abdominal distention. EXAM: CT ABDOMEN AND PELVIS WITH CONTRAST TECHNIQUE: Multidetector CT imaging of the abdomen and pelvis was performed using the standard protocol following bolus administration of intravenous contrast. CONTRAST:  ISOVUE-300 IOPAMIDOL (ISOVUE-300) INJECTION 61% COMPARISON:  04/17/2017 FINDINGS: Lower chest: Right coronary artery atherosclerotic calcification. Small but new left pleural effusion. Hepatobiliary: Hypodensity in segment 4 of the liver along the falciform ligament most likely due to focal fatty infiltration. Cholecystectomy. Pancreas: Unremarkable Spleen: Small hypodensities in the spleen are nonspecific and may be related to the relatively early phase of contrast on  the initial acquisition series. These are not well seen on the more delayed series. Adrenals/Urinary Tract: The adrenal glands appear normal. Exophytic 10 by 9 mm right kidney upper pole lesion measures at fluid density on image 29/2 and is probably a benign cyst. Stomach/Bowel: Sigmoid colon diverticulosis without active diverticulitis. Vascular/Lymphatic: Aortoiliac atherosclerotic vascular disease. No pathologic adenopathy. Reproductive: Uterus absent.  Adnexa unremarkable. Other: No supplemental non-categorized findings. Musculoskeletal: Complex fluid collection extending posteriorly from the right ischial tuberosity with mildly enhancing margins. On the lowest image this associated fluid collection measures 5.6 by 5.4 cm, smaller than on 04/17/2017 but still substantial. There is a truncated/shaved appearance of the posterior margin of the ischial tuberosity. A smaller right trochanteric bursal fluid collection has mildly enhancing margins and measures 3.6 by 1.5 by 7.1 cm (volume = 20 cm^3). Again, this is smaller than on 04/17/2017, but has undergone interval drainage. Residual infection not excluded. Lower thoracic and lumbar spondylosis and degenerative disc disease with posterolateral rod and pedicle screw fixation at L3-L4-L5. Chronic grade 1 anterolisthesis at L4-5 and interbody fusion at L3-4. Continued loss of disc space most striking at L2-3 and T12-L1. Stable endplate irregularities in the lower thoracic spine and at L1-2. Periumbilical hernias contain omental adipose tissue. IMPRESSION: 1. Abnormal fluid collections in the right trochanteric bursa and below the right ischium, with some irregularity of the right ischial tuberosity stable from  the prior exam. By report, the prior fluid cultures of both of these collections aspirated on 04/17/2017 were negative for organisms. Both of these collections are smaller than on 04/17/2017, although still substantial. Stable truncated/shaved appearance of the  right ischial tuberosity. 2. 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 Expectation for lumbar impingement at the L3-4 and L4-5 levels, and on the left at L5-S1. There is likely also impingement at L1-2, L2-3, and T10-11. 3. Small but new left pleural effusion. 4. No cause for abdominal distention is identified. No dilated bowel. 5. Other imaging findings of potential clinical significance: Aortic Atherosclerosis (ICD10-I70.0). Coronary atherosclerosis. Sigmoid diverticulosis without active diverticulitis. Electronically Signed   By: Gaylyn Rong M.D.   On: 04/29/2017 20:54     Medications:  Scheduled: . DULoxetine  30 mg Oral Daily  . DULoxetine  60 mg Oral Daily  . gabapentin  1,200 mg Oral QHS  . gabapentin  600 mg Oral Daily  . hydrocortisone sodium succinate  100 mg Intravenous Q8H  . LORazepam  0.5 mg Intravenous Once   Continuous: . sodium chloride 100 mL/hr at 04/30/17 0600  . aztreonam Stopped (04/30/17 0557)  . [START ON 05/01/2017] levofloxacin (LEVAQUIN) IV    . vancomycin Stopped (04/30/17 1008)   [facility]:[id number] **OR** acetaminophen, gadobenate dimeglumine, HYDROcodone-acetaminophen, LORazepam, methocarbamol, ondansetron **OR** ondansetron (ZOFRAN) IV  Assessment/Plan:  Active Problems:   Adrenal insufficiency (HCC)   PAF (paroxysmal atrial fibrillation) (HCC)   Hypotension   Systemic inflammatory response syndrome (SIRS) (HCC)   Stage I pressure ulcer of sacral region   SIRS (systemic inflammatory response syndrome) (HCC)    Thoracic discitis with sepsis MRI of the thoracic spine did show evidence for discitis and small abscess at T10-11.  Moderate spinal stenosis was noted at that level.  We will discuss with neurosurgery.  Fluid may need to be drained by IR. Patient does not appear to have any neurological deficits at this time.  Will also need to involve infectious disease.  Continue  current antibiotics for now.  Follow-up on blood cultures.  Improvement in sepsis physiology noted.  Pro-calcitonin was 0.59.  WBC slightly better today.  But anticipate that it will stay high due to steroids.  Abnormal fluid collections in the right trochanteric bursa and below the right ischium This was noted during her previous hospitalization as well.  Fluid was aspirated by interventional radiology however cultures were negative.  It was thought to be hematoma.  There was also some concern for history of tuberosity osteomyelitis.  Patient was seen by infectious disease and was prescribed long-term Levaquin.  Recent group B strep bacteremia Seen by infectious disease during previous hospitalization.  Was discharged on Levaquin, to be taken until May 2.  Hypotension likely due to adrenal insufficiency Initially hypotensive in the ED.  Patient was given fluids.  Since she was on chronic steroid she was also given stress dose steroids.  Blood pressures have improved.  Continue the stress dose steroids for today and start tapering from tomorrow.  Paroxysmal atrial fibrillation Patient's chads 2 vascular score is 5.  She is not on anticoagulation due to previous history of falls and recurrent bleeding.  Rate was controlled with the metoprolol which was held due to hypotension.  Continue to monitor on telemetry.  Sacral decubitus and foot ulcer Wound care  Normocytic anemia Hemoglobin is stable.  No evidence of overt bleeding.  Continue to monitor closely.  Psoriatic arthritis On chronic steroids.  Currently on stress dose steroids.  Also on apremilast at home.  Chronic pain syndrome Noted to be on multiple medications for same at home.  This includes duloxetine, Norco, gabapentin and Robaxin.  Essential hypertension Holding her blood pressure medicines due to sepsis and hypotension.  Diabetes mellitus type 2 Continue SSI  Osteoporosis Stable  DVT Prophylaxis: SCDs    Code Status:  Full code Family Communication: Discussed with the patient Disposition Plan: Management as outlined above.    LOS: 0 days   Osvaldo Shipper  [facility] Pager [phone] 04/30/2017, 11:23 AM  If 7PM-7AM, please contact night-coverage at [email or web address], password [facility]

## 2017-04-30 NOTE — Progress Notes (Signed)
Pharmacy Antibiotic Note  Andrea Yu is a 77 y.o. female admitted on 04/29/2017 with sepsis.  Pharmacy has been consulted for azactam, levaquin and vancomycin dosing.  Plan: Azactam 2 Gm IV q8h Levaquin 750 mg IV q48h Vancomycin 1 Gm x1 then 750 mg IV q36h for est AUC = 475 Goal AUC =400-500 F/u scr/cultures/levels  Height: 4\' 11"  (149.9 cm) Weight: 155 lb (70.3 kg) IBW/kg (Calculated) : 43.2  Temp (24hrs), Avg:98.5 F (36.9 C), Min:98.4 F (36.9 C), Max:98.5 F (36.9 C)  Recent Labs  Lab 04/29/17 1759 04/29/17 1845 04/29/17 2146  WBC 18.3*  --   --   CREATININE 0.85 0.90  --   LATICACIDVEN  --  2.55* 2.81*    Estimated Creatinine Clearance: 43.9 mL/min (by C-G formula based on SCr of 0.9 mg/dL).    Allergies  Allergen Reactions  . Penicillins Anaphylaxis    THROAT SWELLING  Has patient had a PCN reaction causing immediate rash, facial/tongue/throat swelling, SOB or lightheadedness with hypotension:  # # YES # #  Has patient had a PCN reaction causing severe rash involving mucus membranes or skin necrosis: # # # YES # # # Has patient had a PCN reaction that required hospitalization:No Has patient had a PCN reaction occurring within the last 10 years:  # # YES # #  If all of the above answers are "NO", then may proceed with Cephalosporin use.   2147 [Denosumab] Other (See Comments)    Severe Pain in the groin area.  . Fosamax [Alendronate Sodium]     UNSPECIFIED REACTION     Antimicrobials this admission: 4/5 azactam >>  4/5 levaquin >> 4/5 vancomycin >>  Dose adjustments this admission:   Microbiology results:  BCx:   UCx:    Sputum:    MRSA PCR:   Thank you for allowing pharmacy to be a part of this patient's care.  Jake Seats 04/30/2017 1:25 AM

## 2017-05-01 DIAGNOSIS — Z889 Allergy status to unspecified drugs, medicaments and biological substances status: Secondary | ICD-10-CM

## 2017-05-01 DIAGNOSIS — M7060 Trochanteric bursitis, unspecified hip: Secondary | ICD-10-CM

## 2017-05-01 DIAGNOSIS — L89159 Pressure ulcer of sacral region, unspecified stage: Secondary | ICD-10-CM

## 2017-05-01 LAB — BASIC METABOLIC PANEL
ANION GAP: 8 (ref 5–15)
BUN: 16 mg/dL (ref 6–20)
CHLORIDE: 106 mmol/L (ref 101–111)
CO2: 26 mmol/L (ref 22–32)
Calcium: 8.5 mg/dL — ABNORMAL LOW (ref 8.9–10.3)
Creatinine, Ser: 0.61 mg/dL (ref 0.44–1.00)
Glucose, Bld: 131 mg/dL — ABNORMAL HIGH (ref 65–99)
POTASSIUM: 3.6 mmol/L (ref 3.5–5.1)
SODIUM: 140 mmol/L (ref 135–145)

## 2017-05-01 LAB — URINE CULTURE: Culture: NO GROWTH

## 2017-05-01 LAB — CBC
HCT: 25.9 % — ABNORMAL LOW (ref 36.0–46.0)
Hemoglobin: 8.2 g/dL — ABNORMAL LOW (ref 12.0–15.0)
MCH: 28 pg (ref 26.0–34.0)
MCHC: 31.7 g/dL (ref 30.0–36.0)
MCV: 88.4 fL (ref 78.0–100.0)
PLATELETS: 519 10*3/uL — AB (ref 150–400)
RBC: 2.93 MIL/uL — AB (ref 3.87–5.11)
RDW: 15.3 % (ref 11.5–15.5)
WBC: 10.3 10*3/uL (ref 4.0–10.5)

## 2017-05-01 MED ORDER — CEFTRIAXONE SODIUM 500 MG IJ SOLR
500.0000 mg | Freq: Once | INTRAMUSCULAR | Status: AC
  Administered 2017-05-01: 500 mg via INTRAVENOUS
  Filled 2017-05-01: qty 500

## 2017-05-01 MED ORDER — HYDROCORTISONE NA SUCCINATE PF 100 MG IJ SOLR
50.0000 mg | Freq: Two times a day (BID) | INTRAMUSCULAR | Status: AC
  Administered 2017-05-02 (×2): 50 mg via INTRAVENOUS
  Filled 2017-05-01 (×2): qty 2

## 2017-05-01 MED ORDER — METOPROLOL TARTRATE 25 MG PO TABS
25.0000 mg | ORAL_TABLET | Freq: Two times a day (BID) | ORAL | Status: AC
  Administered 2017-05-01 – 2017-05-02 (×3): 25 mg via ORAL
  Filled 2017-05-01 (×3): qty 1

## 2017-05-01 MED ORDER — HYDROCORTISONE NA SUCCINATE PF 100 MG IJ SOLR
50.0000 mg | Freq: Three times a day (TID) | INTRAMUSCULAR | Status: AC
  Administered 2017-05-01 (×2): 50 mg via INTRAVENOUS
  Filled 2017-05-01 (×2): qty 2

## 2017-05-01 MED ORDER — PREDNISONE 20 MG PO TABS
40.0000 mg | ORAL_TABLET | Freq: Every day | ORAL | Status: DC
  Administered 2017-05-03 – 2017-05-04 (×2): 40 mg via ORAL
  Filled 2017-05-01 (×2): qty 2

## 2017-05-01 NOTE — Progress Notes (Signed)
INFECTIOUS DISEASE PROGRESS NOTE  ID: Andrea Yu is a 77 y.o. female with  Active Problems:   Adrenal insufficiency (HCC)   PAF (paroxysmal atrial fibrillation) (HCC)   Hypotension   Systemic inflammatory response syndrome (SIRS) (HCC)   Stage I pressure ulcer of sacral region   SIRS (systemic inflammatory response syndrome) (HCC)  Subjective: No complaints.  No problem with ceftriaxone trial dose yesterday.  Explained to her that we will give her higher dose today  Abtx:  Anti-infectives (From admission, onward)   Start     Dose/Rate Route Frequency Ordered Stop   05/01/17 1800  levofloxacin (LEVAQUIN) IVPB 750 mg     750 mg 100 mL/hr over 90 Minutes Intravenous Every 48 hours 04/30/17 0130     04/30/17 1500  cefTRIAXone (ROCEPHIN) 250 mg in dextrose 5 % 50 mL IVPB    Note to Pharmacy:  Test dose in ICU Please have epi and benadryl available at bedside   250 mg 100 mL/hr over 30 Minutes Intravenous  Once 04/30/17 1448 04/30/17 1623   04/30/17 0800  vancomycin (VANCOCIN) IVPB 750 mg/150 ml premix     750 mg 150 mL/hr over 60 Minutes Intravenous Every 36 hours 04/30/17 0130     04/30/17 0400  aztreonam (AZACTAM) 2 GM IVPB     2 g 100 mL/hr over 30 Minutes Intravenous Every 8 hours 04/30/17 0130     04/29/17 1845  levofloxacin (LEVAQUIN) IVPB 750 mg     750 mg 100 mL/hr over 90 Minutes Intravenous  Once 04/29/17 1835 04/29/17 2215   04/29/17 1845  aztreonam (AZACTAM) 2 GM IVPB     2 g 100 mL/hr over 30 Minutes Intravenous  Once 04/29/17 1835 04/29/17 2215   04/29/17 1845  vancomycin (VANCOCIN) IVPB 1000 mg/200 mL premix     1,000 mg 200 mL/hr over 60 Minutes Intravenous  Once 04/29/17 1835 04/29/17 2318      Medications:  Scheduled: . DULoxetine  90 mg Oral Daily  . gabapentin  1,200 mg Oral QHS  . gabapentin  600 mg Oral Daily  . hydrocortisone sodium succinate  100 mg Intravenous Q8H  . LORazepam  0.5 mg Intravenous Once  . metoprolol tartrate  12.5 mg Oral  BID    Objective: Vital signs in last 24 hours: Temp:  [98.5 F (36.9 C)-98.6 F (37 C)] 98.5 F (36.9 C) (04/07 0354) Pulse Rate:  [78-97] 90 (04/07 0354) Resp:  [14-23] 18 (04/07 0354) BP: (110-160)/(47-75) 160/70 (04/07 0354) SpO2:  [94 %-100 %] 98 % (04/07 0354)   General appearance: alert, cooperative and no distress Resp: clear to auscultation bilaterally Cardio: regular rate and rhythm GI: normal findings: bowel sounds normal and soft, non-tender  Lab Results Recent Labs    04/30/17 0312 05/01/17 0452  WBC 13.4* 10.3  HGB 8.9* 8.2*  HCT 28.6* 25.9*  NA 139 140  K 4.3 3.6  CL 101 106  CO2 30 26  BUN 17 16  CREATININE 0.79 0.61   Liver Panel Recent Labs    04/29/17 1759 04/30/17 0312  PROT 6.1* 5.9*  ALBUMIN 2.6* 2.5*  AST 29 21  ALT 17 17  ALKPHOS 110 109  BILITOT 0.3 0.4   Sedimentation Rate Recent Labs    04/29/17 1754  ESRSEDRATE 69*   C-Reactive Protein Recent Labs    04/29/17 1835  CRP 5.7*    Microbiology: Recent Results (from the past 240 hour(s))  Blood culture (routine x 2)  Status: None (Preliminary result)   Collection Time: 04/29/17  6:35 PM  Result Value Ref Range Status   Specimen Description   Final    BLOOD RIGHT FOREARM Performed at [facility], [address], [place], [place] [address]    Special Requests   Final    BOTTLES DRAWN AEROBIC AND ANAEROBIC Blood Culture adequate volume Performed at [facility], [address], [place], [place] [address]    Culture   Final    NO GROWTH < 24 HOURS Performed at [facility], [address] [place], [place] [address]    Report Status PENDING  Incomplete  Blood culture (routine x 2)     Status: None (Preliminary result)   Collection Time: 04/29/17  6:50 PM  Result Value Ref Range Status   Specimen Description   Final    BLOOD RIGHT FOREARM Performed at [facility], [address], [place], [place]  [address]    Special Requests   Final    BOTTLES DRAWN AEROBIC AND ANAEROBIC Blood Culture adequate volume Performed at [facility], [address], [place], [place] [address]    Culture   Final    NO GROWTH < 24 HOURS Performed at [facility], [address], [place], [place] [address]    Report Status PENDING  Incomplete  Urine culture     Status: None   Collection Time: 04/29/17  9:00 PM  Result Value Ref Range Status   Specimen Description   Final    URINE, RANDOM Performed at [facility], [address] [place], [place] [address]    Special Requests   Final    NONE Performed at [facility], [address] [place], [place] [address]    Culture   Final    NO GROWTH Performed at [facility], [address], [place], [place] [address]    Report Status 05/01/2017 FINAL  Final    Studies/Results: Dg Chest 2 View  Result Date: 04/29/2017 CLINICAL DATA:  Cough with abnormal right lower lobe physical exam. EXAM: CHEST - 2 VIEW COMPARISON:  04/17/2017 FINDINGS: Heart size is normal. There is aortic atherosclerosis. The lungs are clear. The vascularity is normal. No effusions. Chronic degenerative changes of the shoulder and spine as seen previously. IMPRESSION: No active disease by radiography. Specifically, no right lower lobe disease identified. Electronically Signed   By: Paulina Fusi M.D.   On: 04/29/2017 17:39   Mr Thoracic Spine Wo Contrast  Addendum Date: 04/30/2017   ADDENDUM REPORT: 04/30/2017 11:20 ADDENDUM: These results were called by telephone at the time of interpretation on 04/30/2017 at 11:19 am to Dr. Rito Ehrlich , who verbally acknowledged these results. Electronically Signed   By: Marlan Palau M.D.   On: 04/30/2017 11:20   Result Date: 04/30/2017 CLINICAL DATA:  Back pain.  Abnormal x-ray. EXAM: MRI THORACIC SPINE WITHOUT CONTRAST TECHNIQUE: Multiplanar, multisequence MR imaging of the thoracic spine  was performed. No intravenous contrast was administered. COMPARISON:  Chest two-view 04/29/2017, chest CT 04/17/2017. CT abdomen pelvis 04/29/2017 FINDINGS: Alignment: Anterolisthesis T8-9. Mild retrolisthesis T5-6 and T6-7 and T12-L1. Vertebrae: Bone marrow edema throughout T10 and T11 centered at the disc space. Endplate erosions at the disc space with hyperintense signal in the disc. Bone erosion in the endplates is noted on recent CT and findings are compatible with discitis and osteomyelitis. Mild chronic compression fracture of T3. Hemangioma T5 vertebral  body. No evidence of metastatic disease. Cord:  Negative for cord compression Paraspinal and other soft tissues: Small bilateral pleural effusions. Paraspinous soft tissue edema at T10-11 compatible with acute infection. Small epidural fluid collection in the canal compatible with abscess. Disc levels: Image quality degraded by motion. Extensive disc degeneration and spondylosis throughout the thoracic spine. Central and left-sided spurring at T4-5 with mild spinal stenosis. Left-sided osteophyte T5-6 Central left-sided spurring T6-7. Disc and facet degeneration T8-9 with mild spinal stenosis Disc degeneration and spondylosis at T9-10. Diffuse bone marrow edema and endplate erosive changes at T10-11 compatible with infection. Paraspinous soft tissue edema. Ventral epidural soft tissue thickening and fluid collection on the right compatible with small epidural abscess. Cord flattening with moderate spinal stenosis. Mild degenerative change at T11-T12 IMPRESSION: Image quality degraded by motion. 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 Paraspinous soft tissue edema. Epidural soft tissue thickening and small abscess on the right contributing to moderate spinal stenosis. Electronically Signed: By: Marlan Palau M.D. On: 04/30/2017 11:12   Mr Lumbar Spine Wo  Contrast  Result Date: 04/30/2017 CLINICAL DATA:  Back pain greater than 6 weeks. Patient refused intravenous contrast. EXAM: MRI LUMBAR SPINE WITHOUT CONTRAST TECHNIQUE: Multiplanar, multisequence MR imaging of the lumbar spine was performed. No intravenous contrast was administered. COMPARISON:  Lumbar MRI 09/01/2015 FINDINGS: Segmentation:  Normal Alignment: Retrolisthesis T12-L1, L1-2, L3-4. 8 mm anterolisthesis L4-5. Vertebrae:  Negative for lumbar fracture or mass. Bone marrow edema involving T10 and T11 disc spaces with edema in the T10-11 disc space. Findings suggestive of discitis and osteomyelitis. This area is better evaluated on the thoracic MRI. Conus medullaris and cauda equina: Conus extends to the L1-2 level. Conus and cauda equina appear normal. Paraspinal and other soft tissues: Paraspinous soft tissues normal. No fluid collection or mass Disc levels: T12-L1: Moderate disc and facet degeneration with mild spinal stenosis L1-2: Moderate disc and facet degeneration with moderate spinal stenosis and moderate foraminal stenosis bilaterally. Stenosis has progressed since the prior study. L2-3: Moderately large central and left-sided disc protrusion is unchanged. Bilateral facet hypertrophy. Severe spinal stenosis similar to the prior study L3-4: Pedicle screw fusion. Solid interbody fusion. Moderate foraminal stenosis bilaterally due to spurring L4-5: Pedicle screw fusion with 8 mm anterolisthesis similar to the prior study. Posterior decompression. Moderate foraminal encroachment bilaterally. L5-S1: Mild disc and facet degeneration. Image quality degraded by motion IMPRESSION: Suboptimal image quality due to motion There is bone marrow edema at T10-11 suggestive of discitis and osteomyelitis. See thoracic MRI report Pedicle screw fusion L3-4 L4-5. There is significant foraminal encroachment at both levels due to spurring Central left-sided disc protrusion L2-3 with severe spinal stenosis. Mild spinal  stenosis T12-L1 and moderate spinal stenosis L1-2 Electronically Signed   By: Marlan Palau M.D.   On: 04/30/2017 11:03   Ct Abdomen Pelvis W Contrast  Result Date: 04/29/2017 CLINICAL DATA:  Back spasms for 2 weeks worsening today. Nausea and vomiting. Abdominal distention. EXAM: CT ABDOMEN AND PELVIS WITH CONTRAST TECHNIQUE: Multidetector CT imaging of the abdomen and pelvis was performed using the standard protocol following bolus administration of intravenous contrast. CONTRAST:  ISOVUE-300 IOPAMIDOL (ISOVUE-300) INJECTION 61% COMPARISON:  04/17/2017 FINDINGS: Lower chest: Right coronary artery atherosclerotic calcification. Small but new left pleural effusion. Hepatobiliary: Hypodensity in segment 4 of the liver along the falciform ligament most likely due to focal fatty infiltration. Cholecystectomy. Pancreas: Unremarkable Spleen: Small hypodensities in the spleen are nonspecific and  may be related to the relatively early phase of contrast on the initial acquisition series. These are not well seen on the more delayed series. Adrenals/Urinary Tract: The adrenal glands appear normal. Exophytic 10 by 9 mm right kidney upper pole lesion measures at fluid density on image 29/2 and is probably a benign cyst. Stomach/Bowel: Sigmoid colon diverticulosis without active diverticulitis. Vascular/Lymphatic: Aortoiliac atherosclerotic vascular disease. No pathologic adenopathy. Reproductive: Uterus absent.  Adnexa unremarkable. Other: No supplemental non-categorized findings. Musculoskeletal: Complex fluid collection extending posteriorly from the right ischial tuberosity with mildly enhancing margins. On the lowest image this associated fluid collection measures 5.6 by 5.4 cm, smaller than on 04/17/2017 but still substantial. There is a truncated/shaved appearance of the posterior margin of the ischial tuberosity. A smaller right trochanteric bursal fluid collection has mildly enhancing margins and measures 3.6  by 1.5 by 7.1 cm (volume = 20 cm^3). Again, this is smaller than on 04/17/2017, but has undergone interval drainage. Residual infection not excluded. Lower thoracic and lumbar spondylosis and degenerative disc disease with posterolateral rod and pedicle screw fixation at L3-L4-L5. Chronic grade 1 anterolisthesis at L4-5 and interbody fusion at L3-4. Continued loss of disc space most striking at L2-3 and T12-L1. Stable endplate irregularities in the lower thoracic spine and at L1-2. Periumbilical hernias contain omental adipose tissue. IMPRESSION: 1. Abnormal fluid collections in the right trochanteric bursa and below the right ischium, with some irregularity of the right ischial tuberosity stable from the prior exam. By report, the prior fluid cultures of both of these collections aspirated on 04/17/2017 were negative for organisms. Both of these collections are smaller than on 04/17/2017, although still substantial. Stable truncated/shaved appearance of the right ischial tuberosity. 2. 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 Expectation for lumbar impingement at the L3-4 and L4-5 levels, and on the left at L5-S1. There is likely also impingement at L1-2, L2-3, and T10-11. 3. Small but new left pleural effusion. 4. No cause for abdominal distention is identified. No dilated bowel. 5. Other imaging findings of potential clinical significance: Aortic Atherosclerosis (ICD10-I70.0). Coronary atherosclerosis. Sigmoid diverticulosis without active diverticulitis. Electronically Signed   By: Gaylyn Rong M.D.   On: 04/29/2017 20:54     Assessment/Plan:  Sepsis Thoracic discitis/osteo Trochanteric Bursitis Ischial spine osteomyelitis Recent group B strep bacteremia Heel decubitus Sacral Decubitus PEN allergy  Total days of antibiotics: 2- vanco/ceftriaxone/aztreonam/levaquin  Will give her 2nd test dose of ceftriaxone No  problem with ceftriaxone trial dose yesterday.  Explained to her that we will give her higher dose today Explained to RN need for monitoring after test dose.  If she does well, consider full dose tomorrow.   Stop levaquin, aztreonam She doesn't want aspirate.            Johny Sax MD, FACP Infectious Diseases (pager) [phone] [email or web address] 05/01/2017, 10:44 AM  LOS: 1 day

## 2017-05-01 NOTE — [place] FL2 (Signed)
[place] MEDICAID FL2 LEVEL OF CARE SCREENING TOOL     IDENTIFICATION  Patient Name: Andrea Yu Birthdate: 1940/09/05 Sex: female Admission Date (Current Location): 04/29/2017  [facility] and [place] Number:  Producer, television/film/video and Address:  [facility],  [address] [place] [address]      Provider Number: [id number]  Attending Physician Name and Address:  Osvaldo Shipper, MD  Relative Name and Phone Number:       Current Level of Care: Hospital Recommended Level of Care: Skilled Nursing Facility Prior Approval Number:    Date Approved/Denied:   PASRR Number:    Discharge Plan: SNF    Current Diagnoses: Patient Active Problem List   Diagnosis Date Noted  . SIRS (systemic inflammatory response syndrome) (HCC) 04/30/2017  . Sepsis due to group B Streptococcus (HCC) 04/19/2017  . Stage I pressure ulcer of sacral region 04/19/2017  . Altered mental status 04/17/2017  . Hip osteomyelitis, right (HCC) 04/17/2017  . Pressure injury of skin 04/17/2017  . History of blood transfusion   . Head injury, closed, with concussion   . GERD (gastroesophageal reflux disease)   . Dysrhythmia   . Complication of anesthesia   . Arthritis   . Anemia   . Displaced spiral fracture of shaft of right tibia, subsequent encounter for closed fracture with delayed healing 11/11/2016  . 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 Subacute osteomyelitis, right ankle and foot (HCC)   . SAH (subarachnoid hemorrhage) (HCC) 09/14/2014  . Atrial fibrillation with RVR (HCC) 09/14/2014  . Hypertension 09/14/2014  . Laceration of eyebrow, left 09/14/2014  . Fall 09/14/2014  . Gastritis, acute 11/11/2012  . Cellulitis of leg, right 11/11/2012  . Obesity (BMI 30-39.9)- suspected sleep apnea, declines sleep study 11/10/2012  .  Psoriatic arthritis (HCC) 11/06/2012  . Iron deficiency anemia- transfused this admission 11/06/2012  . Depression 11/06/2012  . Peripheral neuropathy 11/06/2012  . Shock circulatory on admission 11/06/12 11/06/2012  . Systemic inflammatory response syndrome (SIRS) (HCC) 11/06/2012  . Nausea and vomiting 08/09/2012  . Adrenal insufficiency (HCC) 08/09/2012  . PAF (paroxysmal atrial fibrillation) (HCC) 08/09/2012  . Hypotension 08/09/2012  . Dehydration 08/09/2012    Orientation RESPIRATION BLADDER Height & Weight     Self, Time, Situation, Place  Normal Incontinent, External catheter Weight: 155 lb (70.3 kg) Height:  4\' 11"  (149.9 cm)  BEHAVIORAL SYMPTOMS/MOOD NEUROLOGICAL BOWEL NUTRITION STATUS      Continent Diet(cardiac)  AMBULATORY STATUS COMMUNICATION OF NEEDS Skin   Extensive Assist   PU Stage and Appropriate Care(DTI on sacrum foam dressing PRN)     PU Stage 3 Dressing: (heel- moist to dry dressing PRN)                 Personal Care Assistance Level of Assistance  Bathing, Dressing Bathing Assistance: Limited assistance   Dressing Assistance: Limited assistance     Functional Limitations Info             SPECIAL CARE FACTORS FREQUENCY  PT (By licensed PT), OT (By licensed OT)     PT Frequency: 5/wk OT Frequency: 5/wk            Contractures      Additional Factors Info  Code Status, Allergies, Psychotropic Code Status Info: FULL Allergies Info: Penicillins, Prolia Denosumab, Fosamax Alendronate Sodium  Psychotropic Info: cymbalta, ativan         Current Medications (05/01/2017):  This is the current hospital active medication list Current Facility-Administered Medications  Medication Dose Route Frequency Provider Last Rate Last Dose  . acetaminophen (TYLENOL) tablet 650 mg  650 mg Oral Q6H PRN Osvaldo Shipper, MD   650 mg at 04/30/17 0940   Or  . acetaminophen (TYLENOL) suppository 650 mg  650 mg Rectal Q6H PRN Osvaldo Shipper, MD      .  aztreonam (AZACTAM) 2 GM IVPB  2 g Intravenous Q8H Osvaldo Shipper, MD   Stopped at 05/01/17 [phone]  . diphenhydrAMINE (BENADRYL) injection 50 mg  50 mg Intravenous Q6H PRN Ginnie Smart, MD      . DULoxetine (CYMBALTA) DR capsule 90 mg  90 mg Oral Daily Osvaldo Shipper, MD      . gabapentin (NEURONTIN) capsule 1,200 mg  1,200 mg Oral QHS Osvaldo Shipper, MD      . gabapentin (NEURONTIN) capsule 600 mg  600 mg Oral Daily Osvaldo Shipper, MD      . gadobenate dimeglumine (MULTIHANCE) injection 15 mL  15 mL Intravenous Once PRN Osvaldo Shipper, MD      . HYDROcodone-acetaminophen (NORCO/VICODIN) 5-325 MG per tablet 1-2 tablet  1-2 tablet Oral Q4H PRN Osvaldo Shipper, MD      . hydrocortisone sodium succinate (SOLU-CORTEF) 100 MG injection 100 mg  100 mg Intravenous Q8H Osvaldo Shipper, MD   100 mg at 05/01/17 0451  . levofloxacin (LEVAQUIN) IVPB 750 mg  750 mg Intravenous Q48H Osvaldo Shipper, MD      . LORazepam (ATIVAN) injection 0.5 mg  0.5 mg Intravenous Once Osvaldo Shipper, MD      . LORazepam (ATIVAN) injection 0.5 mg  0.5 mg Intravenous Once PRN Osvaldo Shipper, MD      . methocarbamol (ROBAXIN) tablet 500 mg  500 mg Oral Q8H PRN Osvaldo Shipper, MD   500 mg at 04/30/17 0231  . metoprolol tartrate (LOPRESSOR) tablet 12.5 mg  12.5 mg Oral BID Osvaldo Shipper, MD   12.5 mg at 04/30/17 2133  . ondansetron (ZOFRAN) tablet 4 mg  4 mg Oral Q6H PRN Osvaldo Shipper, MD       Or  . ondansetron [facility]) injection 4 mg  4 mg Intravenous Q6H PRN Osvaldo Shipper, MD      . vancomycin (VANCOCIN) IVPB 750 mg/150 ml premix  750 mg Intravenous Q36H Osvaldo Shipper, MD   Stopped at 04/30/17 1008     Discharge Medications: Please see discharge summary for a list of discharge medications.  Relevant Imaging Results:  Relevant Lab Results:   Additional Information SSN: [id number]  [place], [place]

## 2017-05-01 NOTE — Progress Notes (Signed)
[facility] PROGRESS NOTE  Andrea Yu WUX:[id number] DOB: 1941-01-24 DOA: 04/29/2017  PCP: Jarome Matin, MD  Brief History/Interval Summary: 77 year old Caucasian female with a past medical history of atrial fibrillation not on anticoagulation due to recurrent falls and previous history of intracranial hemorrhage, history of peripheral neuropathy, psoriatic arthritis on chronic steroids, hypertension, chronic pain, severe osteoporosis, recently hospitalized for management of abscess detected in the right lower extremity along right trochanteric bursa, and right posterior tuberosity with a right femoral head osteomyelitis.  During the last hospitalization this fluid collection was drained.  It was negative for any growth of bacteria.  It was thought to be hematoma.  She had blood cultures positive for group B strep.  Infectious disease was consulted.  She was discharged on prolonged course of oral Levaquin.  Echocardiogram done at that time did not show any vegetation.  Urine cultures grew E. coli.  She was discharged to skilled nursing facility and presented back to worsening back pain.  Found to be septic and was hospitalized.  MRI revealed discitis.  Infectious disease was consulted.  Reason for Visit: T10-11 discitis  Consultants: Neurosurgery.  Infectious disease.  Procedures: None  Antibiotics: Currently on vancomycin and Levaquin and aztreonam Infectious disease administered test dose of ceftriaxone.  Subjective/Interval History: Patient continues to feel well.  Back Pain is reasonably well controlled.  Denies any nausea vomiting.  No chest pain or shortness of breath.    ROS: Denies any headaches  Objective:  Vital Signs  Vitals:   04/30/17 1600 04/30/17 1746 04/30/17 2150 05/01/17 0354  BP: (!) 112/47 130/75 (!) 135/57 (!) 160/70  Pulse: 90 97 96 90  Resp: 14 18 18 18   Temp: 98.5 F (36.9 C) 98.5 F (36.9 C) 98.6 F (37 C) 98.5 F (36.9 C)  TempSrc: Oral  Oral Oral Oral  SpO2: 94% 99% 97% 98%  Weight:      Height:        Intake/Output Summary (Last 24 hours) at 05/01/2017 0903 Last data filed at 05/01/2017 0732 Gross per 24 hour  Intake 1388.33 ml  Output 2650 ml  Net -1261.67 ml   Filed Weights   04/29/17 1842  Weight: 70.3 kg (155 lb)    General appearance: Awake alert.  In no distress Resp: Clear to auscultation bilaterally Cardio: S1-S2 is normal regular.  No S3-S4.  No rubs murmurs or bruit GI: Abdomen is soft.  Nontender nondistended.  Bowel sounds are present.  No masses organomegaly Extremities: No edema Neurologic:  Oriented x3.  No focal neurological deficits noted.  Lab Results:  Data Reviewed: I have personally reviewed following labs and imaging studies  CBC: Recent Labs  Lab 04/29/17 1759 04/29/17 1845 04/30/17 0312 05/01/17 0452  WBC 18.3*  --  13.4* 10.3  NEUTROABS 15.4*  --   --   --   HGB 9.5* 8.8* 8.9* 8.2*  HCT 30.8* 26.0* 28.6* 25.9*  MCV 90.6  --  90.8 88.4  PLT 618*  --  541* 519*    Basic Metabolic Panel: Recent Labs  Lab 04/29/17 1759 04/29/17 1845 04/30/17 0312 05/01/17 0452  NA 138 139 139 140  K 4.4 4.3 4.3 3.6  CL 99* 100* 101 106  CO2 27  --  30 26  GLUCOSE 122* 113* 117* 131*  BUN 21* 17 17 16   CREATININE 0.85 0.90 0.79 0.61  CALCIUM 9.7  --  8.7* 8.5*  MG  --   --  1.8  --  PHOS  --   --  4.0  --     GFR: Estimated Creatinine Clearance: 49.4 mL/min (by C-G formula based on SCr of 0.61 mg/dL).  Liver Function Tests: Recent Labs  Lab 04/29/17 1759 04/30/17 0312  AST 29 21  ALT 17 17  ALKPHOS 110 109  BILITOT 0.3 0.4  PROT 6.1* 5.9*  ALBUMIN 2.6* 2.5*     Coagulation Profile: Recent Labs  Lab 04/29/17 1835  INR 1.19    Thyroid Function Tests: Recent Labs    04/30/17 0312  TSH 1.335    Radiology Studies: Dg Chest 2 View  Result Date: 04/29/2017 CLINICAL DATA:  Cough with abnormal right lower lobe physical exam. EXAM: CHEST - 2 VIEW COMPARISON:   04/17/2017 FINDINGS: Heart size is normal. There is aortic atherosclerosis. The lungs are clear. The vascularity is normal. No effusions. Chronic degenerative changes of the shoulder and spine as seen previously. IMPRESSION: No active disease by radiography. Specifically, no right lower lobe disease identified. Electronically Signed   By: Paulina Fusi M.D.   On: 04/29/2017 17:39   Mr Thoracic Spine Wo Contrast  Addendum Date: 04/30/2017   ADDENDUM REPORT: 04/30/2017 11:20 ADDENDUM: These results were called by telephone at the time of interpretation on 04/30/2017 at 11:19 am to Dr. Rito Ehrlich , who verbally acknowledged these results. Electronically Signed   By: Marlan Palau M.D.   On: 04/30/2017 11:20   Result Date: 04/30/2017 CLINICAL DATA:  Back pain.  Abnormal x-ray. EXAM: MRI THORACIC SPINE WITHOUT CONTRAST TECHNIQUE: Multiplanar, multisequence MR imaging of the thoracic spine was performed. No intravenous contrast was administered. COMPARISON:  Chest two-view 04/29/2017, chest CT 04/17/2017. CT abdomen pelvis 04/29/2017 FINDINGS: Alignment: Anterolisthesis T8-9. Mild retrolisthesis T5-6 and T6-7 and T12-L1. Vertebrae: Bone marrow edema throughout T10 and T11 centered at the disc space. Endplate erosions at the disc space with hyperintense signal in the disc. Bone erosion in the endplates is noted on recent CT and findings are compatible with discitis and osteomyelitis. Mild chronic compression fracture of T3. Hemangioma T5 vertebral body. No evidence of metastatic disease. Cord:  Negative for cord compression Paraspinal and other soft tissues: Small bilateral pleural effusions. Paraspinous soft tissue edema at T10-11 compatible with acute infection. Small epidural fluid collection in the canal compatible with abscess. Disc levels: Image quality degraded by motion. Extensive disc degeneration and spondylosis throughout the thoracic spine. Central and left-sided spurring at T4-5 with mild spinal stenosis.  Left-sided osteophyte T5-6 Central left-sided spurring T6-7. Disc and facet degeneration T8-9 with mild spinal stenosis Disc degeneration and spondylosis at T9-10. Diffuse bone marrow edema and endplate erosive changes at T10-11 compatible with infection. Paraspinous soft tissue edema. Ventral epidural soft tissue thickening and fluid collection on the right compatible with small epidural abscess. Cord flattening with moderate spinal stenosis. Mild degenerative change at T11-T12 IMPRESSION: Image quality degraded by motion. 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 Paraspinous soft tissue edema. Epidural soft tissue thickening and small abscess on the right contributing to moderate spinal stenosis. Electronically Signed: By: Marlan Palau M.D. On: 04/30/2017 11:12   Mr Lumbar Spine Wo Contrast  Result Date: 04/30/2017 CLINICAL DATA:  Back pain greater than 6 weeks. Patient refused intravenous contrast. EXAM: MRI LUMBAR SPINE WITHOUT CONTRAST TECHNIQUE: Multiplanar, multisequence MR imaging of the lumbar spine was performed. No intravenous contrast was administered. COMPARISON:  Lumbar MRI 09/01/2015 FINDINGS: Segmentation:  Normal Alignment: Retrolisthesis T12-L1, L1-2, L3-4. 8 mm  anterolisthesis L4-5. Vertebrae:  Negative for lumbar fracture or mass. Bone marrow edema involving T10 and T11 disc spaces with edema in the T10-11 disc space. Findings suggestive of discitis and osteomyelitis. This area is better evaluated on the thoracic MRI. Conus medullaris and cauda equina: Conus extends to the L1-2 level. Conus and cauda equina appear normal. Paraspinal and other soft tissues: Paraspinous soft tissues normal. No fluid collection or mass Disc levels: T12-L1: Moderate disc and facet degeneration with mild spinal stenosis L1-2: Moderate disc and facet degeneration with moderate spinal stenosis and moderate foraminal stenosis  bilaterally. Stenosis has progressed since the prior study. L2-3: Moderately large central and left-sided disc protrusion is unchanged. Bilateral facet hypertrophy. Severe spinal stenosis similar to the prior study L3-4: Pedicle screw fusion. Solid interbody fusion. Moderate foraminal stenosis bilaterally due to spurring L4-5: Pedicle screw fusion with 8 mm anterolisthesis similar to the prior study. Posterior decompression. Moderate foraminal encroachment bilaterally. L5-S1: Mild disc and facet degeneration. Image quality degraded by motion IMPRESSION: Suboptimal image quality due to motion There is bone marrow edema at T10-11 suggestive of discitis and osteomyelitis. See thoracic MRI report Pedicle screw fusion L3-4 L4-5. There is significant foraminal encroachment at both levels due to spurring Central left-sided disc protrusion L2-3 with severe spinal stenosis. Mild spinal stenosis T12-L1 and moderate spinal stenosis L1-2 Electronically Signed   By: Marlan Palau M.D.   On: 04/30/2017 11:03   Ct Abdomen Pelvis W Contrast  Result Date: 04/29/2017 CLINICAL DATA:  Back spasms for 2 weeks worsening today. Nausea and vomiting. Abdominal distention. EXAM: CT ABDOMEN AND PELVIS WITH CONTRAST TECHNIQUE: Multidetector CT imaging of the abdomen and pelvis was performed using the standard protocol following bolus administration of intravenous contrast. CONTRAST:  ISOVUE-300 IOPAMIDOL (ISOVUE-300) INJECTION 61% COMPARISON:  04/17/2017 FINDINGS: Lower chest: Right coronary artery atherosclerotic calcification. Small but new left pleural effusion. Hepatobiliary: Hypodensity in segment 4 of the liver along the falciform ligament most likely due to focal fatty infiltration. Cholecystectomy. Pancreas: Unremarkable Spleen: Small hypodensities in the spleen are nonspecific and may be related to the relatively early phase of contrast on the initial acquisition series. These are not well seen on the more delayed series.  Adrenals/Urinary Tract: The adrenal glands appear normal. Exophytic 10 by 9 mm right kidney upper pole lesion measures at fluid density on image 29/2 and is probably a benign cyst. Stomach/Bowel: Sigmoid colon diverticulosis without active diverticulitis. Vascular/Lymphatic: Aortoiliac atherosclerotic vascular disease. No pathologic adenopathy. Reproductive: Uterus absent.  Adnexa unremarkable. Other: No supplemental non-categorized findings. Musculoskeletal: Complex fluid collection extending posteriorly from the right ischial tuberosity with mildly enhancing margins. On the lowest image this associated fluid collection measures 5.6 by 5.4 cm, smaller than on 04/17/2017 but still substantial. There is a truncated/shaved appearance of the posterior margin of the ischial tuberosity. A smaller right trochanteric bursal fluid collection has mildly enhancing margins and measures 3.6 by 1.5 by 7.1 cm (volume = 20 cm^3). Again, this is smaller than on 04/17/2017, but has undergone interval drainage. Residual infection not excluded. Lower thoracic and lumbar spondylosis and degenerative disc disease with posterolateral rod and pedicle screw fixation at L3-L4-L5. Chronic grade 1 anterolisthesis at L4-5 and interbody fusion at L3-4. Continued loss of disc space most striking at L2-3 and T12-L1. Stable endplate irregularities in the lower thoracic spine and at L1-2. Periumbilical hernias contain omental adipose tissue. IMPRESSION: 1. Abnormal fluid collections in the right trochanteric bursa and below the right ischium, with some irregularity of the right  ischial tuberosity stable from the prior exam. By report, the prior fluid cultures of both of these collections aspirated on 04/17/2017 were negative for organisms. Both of these collections are smaller than on 04/17/2017, although still substantial. Stable truncated/shaved appearance of the right ischial tuberosity. 2. Extensive thoracic and lumbar spondylosis and  degenerative disc disease along with postoperative lower lumbar findings. Expectation for lumbar impingement at the L3-4 and L4-5 levels, and on the left at L5-S1. There is likely also impingement at L1-2, L2-3, and T10-11. 3. Small but new left pleural effusion. 4. No cause for abdominal distention is identified. No dilated bowel. 5. Other imaging findings of potential clinical significance: Aortic Atherosclerosis (ICD10-I70.0). Coronary atherosclerosis. Sigmoid diverticulosis without active diverticulitis. Electronically Signed   By: Gaylyn Rong M.D.   On: 04/29/2017 20:54     Medications:  Scheduled: . DULoxetine  90 mg Oral Daily  . gabapentin  1,200 mg Oral QHS  . gabapentin  600 mg Oral Daily  . hydrocortisone sodium succinate  100 mg Intravenous Q8H  . LORazepam  0.5 mg Intravenous Once  . metoprolol tartrate  12.5 mg Oral BID   Continuous: . aztreonam Stopped (05/01/17 0521)  . levofloxacin (LEVAQUIN) IV    . vancomycin Stopped (04/30/17 1008)   HEN:IDPOEUMPNTIRW **OR** acetaminophen, diphenhydrAMINE, gadobenate dimeglumine, HYDROcodone-acetaminophen, LORazepam, methocarbamol, ondansetron **OR** ondansetron (ZOFRAN) IV  Assessment/Plan:  Active Problems:   Adrenal insufficiency (HCC)   PAF (paroxysmal atrial fibrillation) (HCC)   Hypotension   Systemic inflammatory response syndrome (SIRS) (HCC)   Stage I pressure ulcer of sacral region   SIRS (systemic inflammatory response syndrome) (HCC)    Thoracic discitis with sepsis MRI of the thoracic spine did show evidence for discitis and small abscess at T10-11.  Moderate spinal stenosis was noted at that level.  Neurosurgery was consulted.  Appreciate their input.  No need for surgical intervention.  Infectious disease was consulted who did order drainage of the fluid by interventional radiology.  However patient seems very reluctant to undergo this procedure.  Sepsis physiology has improved.  Pro-calcitonin was 0.59.  WBC  was elevated and continues to improve.  However it may remain elevated due to steroid use.  Patient does not have any neurological deficits.  Continue to monitor and await antibiotic recommendation per infectious disease.  Patient was placed on vancomycin and aztreonam and continued on Levaquin.  Test dose of ceftriaxone was given by infectious disease yesterday which she appears to have tolerated well.   Abnormal fluid collections in the right trochanteric bursa and below the right ischium This was noted during her previous hospitalization as well.  Fluid was aspirated by interventional radiology however cultures were negative.  It was thought to be hematoma.  There was also some concern for history of tuberosity osteomyelitis.  Patient was seen by infectious disease and was prescribed long-term Levaquin.  See above.  Recent group B strep bacteremia Seen by infectious disease during previous hospitalization.  Was discharged on Levaquin, to be taken until May 2.  ID is following.  Blood cultures negative so far from this hospitalization.  Hypotension likely due to adrenal insufficiency Initially hypotensive in the ED.  Patient was given fluids.  Since she was on chronic steroid she was also given stress dose steroids.  Blood pressures have improved.  Cut back on steroids.    Paroxysmal atrial fibrillation Patient's chads 2 vascular score is 5.  She is not on anticoagulation due to previous history of falls and recurrent bleeding.  Continue  low-dose metoprolol.  Resume her usual dose of metoprolol from tomorrow.  Continue to monitor on telemetry.    Sacral decubitus and foot ulcer Wound care  Normocytic anemia Mild drop in hemoglobin is noted but no overt bleeding is present.  Continue to monitor.  Likely dilutional drop.    Psoriatic arthritis On chronic steroids.  Currently on stress dose steroids.  Also on apremilast at home.  Chronic pain syndrome Noted to be on multiple medications for  same at home.  This includes duloxetine, Norco, gabapentin and Robaxin.  Essential hypertension Her antihypertensives were held due to hypotension and sepsis.  Slowly resume.  Diabetes mellitus type 2 No diabetes medication noted under her home medication list.  Osteoporosis Stable  DVT Prophylaxis: SCDs    Code Status: Full code Family Communication: Discussed with the patient Disposition Plan: Management as outlined above.    LOS: 1 day   Osvaldo Shipper  [facility] Pager [phone] 05/01/2017, 9:03 AM  If 7PM-7AM, please contact night-coverage at [email or web address], password [facility]

## 2017-05-02 ENCOUNTER — Inpatient Hospital Stay (HOSPITAL_COMMUNITY): Payer: Medicare Other

## 2017-05-02 DIAGNOSIS — M25559 Pain in unspecified hip: Secondary | ICD-10-CM

## 2017-05-02 DIAGNOSIS — D649 Anemia, unspecified: Secondary | ICD-10-CM

## 2017-05-02 DIAGNOSIS — Z881 Allergy status to other antibiotic agents status: Secondary | ICD-10-CM

## 2017-05-02 LAB — BASIC METABOLIC PANEL
Anion gap: 10 (ref 5–15)
BUN: 17 mg/dL (ref 6–20)
CO2: 26 mmol/L (ref 22–32)
CREATININE: 0.62 mg/dL (ref 0.44–1.00)
Calcium: 8.6 mg/dL — ABNORMAL LOW (ref 8.9–10.3)
Chloride: 108 mmol/L (ref 101–111)
GFR calc Af Amer: >60 mL/min (ref >60)
GFR calc non Af Amer: >60 mL/min (ref >60)
Glucose, Bld: 103 mg/dL — ABNORMAL HIGH (ref 65–99)
Potassium: 3.1 mmol/L — ABNORMAL LOW (ref 3.5–5.1)
Sodium: 144 mmol/L (ref 135–145)

## 2017-05-02 LAB — CBC
HEMATOCRIT: 26.9 % — AB (ref 36.0–46.0)
Hemoglobin: 8.5 g/dL — ABNORMAL LOW (ref 12.0–15.0)
MCH: 28.2 pg (ref 26.0–34.0)
MCHC: 31.6 g/dL (ref 30.0–36.0)
MCV: 89.4 fL (ref 78.0–100.0)
Platelets: 535 10*3/uL — ABNORMAL HIGH (ref 150–400)
RBC: 3.01 MIL/uL — ABNORMAL LOW (ref 3.87–5.11)
RDW: 15.6 % — ABNORMAL HIGH (ref 11.5–15.5)
WBC: 9.7 10*3/uL (ref 4.0–10.5)

## 2017-05-02 MED ORDER — SODIUM CHLORIDE 0.9 % IV SOLN
2.0000 g | INTRAVENOUS | Status: DC
  Administered 2017-05-02 – 2017-05-04 (×3): 2 g via INTRAVENOUS
  Filled 2017-05-02 (×3): qty 2

## 2017-05-02 MED ORDER — LOSARTAN POTASSIUM 50 MG PO TABS
100.0000 mg | ORAL_TABLET | Freq: Every day | ORAL | Status: DC
  Administered 2017-05-02 – 2017-05-04 (×3): 100 mg via ORAL
  Filled 2017-05-02 (×3): qty 2

## 2017-05-02 MED ORDER — POTASSIUM CHLORIDE CRYS ER 20 MEQ PO TBCR
40.0000 meq | EXTENDED_RELEASE_TABLET | ORAL | Status: AC
  Administered 2017-05-02 (×2): 40 meq via ORAL
  Filled 2017-05-02 (×2): qty 2

## 2017-05-02 MED ORDER — METOPROLOL SUCCINATE ER 50 MG PO TB24
50.0000 mg | ORAL_TABLET | Freq: Every day | ORAL | Status: DC
  Administered 2017-05-03 – 2017-05-04 (×2): 50 mg via ORAL
  Filled 2017-05-02 (×2): qty 1

## 2017-05-02 NOTE — Progress Notes (Signed)
[facility] PROGRESS NOTE  Andrea Yu HKV:[id number] DOB: 03-03-1940 DOA: 04/29/2017  PCP: Leanna Battles, MD  Brief History/Interval Summary: 77 year old Caucasian female with a past medical history of atrial fibrillation not on anticoagulation due to recurrent falls and previous history of intracranial hemorrhage, history of peripheral neuropathy, psoriatic arthritis on chronic steroids, hypertension, chronic pain, severe osteoporosis, recently hospitalized for management of abscess detected in the right lower extremity along right trochanteric bursa, and right posterior tuberosity with a right femoral head osteomyelitis.  During the last hospitalization this fluid collection was drained.  It was negative for any growth of bacteria.  It was thought to be hematoma.  She had blood cultures positive for group B strep.  Infectious disease was consulted.  She was discharged on prolonged course of oral Levaquin.  Echocardiogram done at that time did not show any vegetation.  Urine cultures grew E. coli.  She was discharged to skilled nursing facility and presented back to worsening back pain.  Found to be septic and was hospitalized.  MRI revealed discitis.  Infectious disease was consulted.  Reason for Visit: T10-11 discitis  Consultants: Neurosurgery.  Infectious disease.  Procedures: None  Antibiotics: Currently on vancomycin and Levaquin and aztreonam Infectious disease administered test dose of ceftriaxone.  Subjective/Interval History: Patient very reluctant to undergo aspiration of fluid from her thoracic spine.  She experienced a lot of pain during her previous aspiration procedure.  She otherwise feels well.  Denies any nausea vomiting.  ROS: Denies any shortness of breath  Objective:  Vital Signs  Vitals:   05/01/17 1509 05/01/17 2102 05/02/17 0524 05/02/17 0526  BP: (!) 155/59 (!) 149/64 (!) 168/103 (!) 166/75  Pulse: (!) 106 76 92   Resp: '18 18 18   ' Temp: 98.3 F  (36.8 C) 98 F (36.7 C) 98.3 F (36.8 C)   TempSrc: Oral Oral Oral   SpO2: 99% 98% 96%   Weight:      Height:        Intake/Output Summary (Last 24 hours) at 05/02/2017 1008 Last data filed at 05/02/2017 1000 Gross per 24 hour  Intake 510 ml  Output 1950 ml  Net -1440 ml   Filed Weights   04/29/17 1842  Weight: 70.3 kg (155 lb)    General appearance: Awake alert.  In no distress Resp: There to auscultation bilaterally.  No wheezing rales or rhonchi Cardio: S1-S2 is normal regular.  No S3-S4.  No rubs murmurs or bruit GI: Abdomen is soft.  Nontender nondistended.  Bowel sounds are present.  No masses organomegaly Extremities: No edema Neurologic: No focal neurological deficits.  Lab Results:  Data Reviewed: I have personally reviewed following labs and imaging studies  CBC: Recent Labs  Lab 04/29/17 1759 04/29/17 1845 04/30/17 0312 05/01/17 0452 05/02/17 0420  WBC 18.3*  --  13.4* 10.3 9.7  NEUTROABS 15.4*  --   --   --   --   HGB 9.5* 8.8* 8.9* 8.2* 8.5*  HCT 30.8* 26.0* 28.6* 25.9* 26.9*  MCV 90.6  --  90.8 88.4 89.4  PLT 618*  --  541* 519* 535*    Basic Metabolic Panel: Recent Labs  Lab 04/29/17 1759 04/29/17 1845 04/30/17 0312 05/01/17 0452 05/02/17 0420  NA 138 139 139 140 144  K 4.4 4.3 4.3 3.6 3.1*  CL 99* 100* 101 106 108  CO2 27  --  '30 26 26  ' GLUCOSE 122* 113* 117* 131* 103*  BUN 21* '17 17 16 ' 17  CREATININE 0.85 0.90 0.79 0.61 0.62  CALCIUM 9.7  --  8.7* 8.5* 8.6*  MG  --   --  1.8  --   --   PHOS  --   --  4.0  --   --     GFR: Estimated Creatinine Clearance: 49.4 mL/min (by C-G formula based on SCr of 0.62 mg/dL).  Liver Function Tests: Recent Labs  Lab 04/29/17 1759 04/30/17 0312  AST 29 21  ALT 17 17  ALKPHOS 110 109  BILITOT 0.3 0.4  PROT 6.1* 5.9*  ALBUMIN 2.6* 2.5*     Coagulation Profile: Recent Labs  Lab 04/29/17 1835  INR 1.19    Thyroid Function Tests: Recent Labs    04/30/17 0312  TSH 1.335     Radiology Studies: Mr Thoracic Spine Wo Contrast  Addendum Date: 04/30/2017   ADDENDUM REPORT: 04/30/2017 11:20 ADDENDUM: These results were called by telephone at the time of interpretation on 04/30/2017 at 11:19 am to Dr. Maryland Pink , who verbally acknowledged these results. Electronically Signed   By: Franchot Gallo M.D.   On: 04/30/2017 11:20   Result Date: 04/30/2017 CLINICAL DATA:  Back pain.  Abnormal x-ray. EXAM: MRI THORACIC SPINE WITHOUT CONTRAST TECHNIQUE: Multiplanar, multisequence MR imaging of the thoracic spine was performed. No intravenous contrast was administered. COMPARISON:  Chest two-view 04/29/2017, chest CT 04/17/2017. CT abdomen pelvis 04/29/2017 FINDINGS: Alignment: Anterolisthesis T8-9. Mild retrolisthesis T5-6 and T6-7 and T12-L1. Vertebrae: Bone marrow edema throughout T10 and T11 centered at the disc space. Endplate erosions at the disc space with hyperintense signal in the disc. Bone erosion in the endplates is noted on recent CT and findings are compatible with discitis and osteomyelitis. Mild chronic compression fracture of T3. Hemangioma T5 vertebral body. No evidence of metastatic disease. Cord:  Negative for cord compression Paraspinal and other soft tissues: Small bilateral pleural effusions. Paraspinous soft tissue edema at T10-11 compatible with acute infection. Small epidural fluid collection in the canal compatible with abscess. Disc levels: Image quality degraded by motion. Extensive disc degeneration and spondylosis throughout the thoracic spine. Central and left-sided spurring at T4-5 with mild spinal stenosis. Left-sided osteophyte T5-6 Central left-sided spurring T6-7. Disc and facet degeneration T8-9 with mild spinal stenosis Disc degeneration and spondylosis at T9-10. Diffuse bone marrow edema and endplate erosive changes at T10-11 compatible with infection. Paraspinous soft tissue edema. Ventral epidural soft tissue thickening and fluid collection on the right  compatible with small epidural abscess. Cord flattening with moderate spinal stenosis. Mild degenerative change at T11-T12 IMPRESSION: Image quality degraded by motion. 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 Paraspinous soft tissue edema. Epidural soft tissue thickening and small abscess on the right contributing to moderate spinal stenosis. Electronically Signed: By: Franchot Gallo M.D. On: 04/30/2017 11:12   Mr Lumbar Spine Wo Contrast  Result Date: 04/30/2017 CLINICAL DATA:  Back pain greater than 6 weeks. Patient refused intravenous contrast. EXAM: MRI LUMBAR SPINE WITHOUT CONTRAST TECHNIQUE: Multiplanar, multisequence MR imaging of the lumbar spine was performed. No intravenous contrast was administered. COMPARISON:  Lumbar MRI 09/01/2015 FINDINGS: Segmentation:  Normal Alignment: Retrolisthesis T12-L1, L1-2, L3-4. 8 mm anterolisthesis L4-5. Vertebrae:  Negative for lumbar fracture or mass. Bone marrow edema involving T10 and T11 disc spaces with edema in the T10-11 disc space. Findings suggestive of discitis and osteomyelitis. This area is better evaluated on the thoracic MRI. Conus medullaris and cauda equina: Conus extends to the L1-2 level.  Conus and cauda equina appear normal. Paraspinal and other soft tissues: Paraspinous soft tissues normal. No fluid collection or mass Disc levels: T12-L1: Moderate disc and facet degeneration with mild spinal stenosis L1-2: Moderate disc and facet degeneration with moderate spinal stenosis and moderate foraminal stenosis bilaterally. Stenosis has progressed since the prior study. L2-3: Moderately large central and left-sided disc protrusion is unchanged. Bilateral facet hypertrophy. Severe spinal stenosis similar to the prior study L3-4: Pedicle screw fusion. Solid interbody fusion. Moderate foraminal stenosis bilaterally due to spurring L4-5: Pedicle screw fusion with 8 mm  anterolisthesis similar to the prior study. Posterior decompression. Moderate foraminal encroachment bilaterally. L5-S1: Mild disc and facet degeneration. Image quality degraded by motion IMPRESSION: Suboptimal image quality due to motion There is bone marrow edema at T10-11 suggestive of discitis and osteomyelitis. See thoracic MRI report Pedicle screw fusion L3-4 L4-5. There is significant foraminal encroachment at both levels due to spurring Central left-sided disc protrusion L2-3 with severe spinal stenosis. Mild spinal stenosis T12-L1 and moderate spinal stenosis L1-2 Electronically Signed   By: Franchot Gallo M.D.   On: 04/30/2017 11:03   Mr Hip Right W Wo Contrast  Result Date: 05/02/2017 CLINICAL DATA:  Right hip and back pain. Discitis-osteomyelitis at T10-11. EXAM: MRI OF THE RIGHT HIP WITHOUT AND WITH CONTRAST TECHNIQUE: Multiplanar, multisequence MR imaging was performed both before and after administration of intravenous contrast. Sagittal postcontrast images were omitted due to patient hypoxia during the exam. CONTRAST:  6m MULTIHANCE GADOBENATE DIMEGLUMINE 529 MG/ML IV SOLN COMPARISON:  CT pelvis from 04/29/2017 FINDINGS: Bones: Pedicle screws at L3-L4-L5 with associated field heterogeneity. Slight bony truncation of the right ischial tuberosity at the hamstring tendon takeoff site. No associated abnormal marrow edema or enhancement at this time. Similarly, there is no overt marrow edema along the right greater trochanter. Articular cartilage and labrum Articular cartilage: Moderate craniocaudad articular cartilage thinning. No focal defect identified. Labrum:  Grossly unremarkable Joint or bursal effusion Joint effusion:  Absent Bursae: Right trochanteric bursal fluid collection with mild complexity measuring 6.5 by 5.8 by 1.9 cm (volume = 38 cm^3). Complex fluid collection extending posterior and inferior to the right ischium in the subcutaneous tissues measuring 5.4 by 5.1 by 6.6 cm (volume =  95 cm^3). Both of these collections have some accentuated T1 signal internally and only faint marginal enhancement. Muscles and tendons Muscles and tendons: There is edema in the right gluteus medius, gluteus minimus, hip adductor,, and proximal vastus lateralis muscles. The right hamstring is torn and avulsion might explain the truncation of the right ischial attachment of the hamstring. There is at least partial tearing of the left hamstring tendon as well. The right gluteus minimus tendon and potentially portions the right gluteus medius tendon are torn. There is fatty atrophy of the gluteus medius and minimus muscles bilaterally. Other findings Miscellaneous: There is a band of low T2 signal in the subcutaneous tissues posterior to the left ischial tuberosity. Trace presacral edema. IMPRESSION: 1. Complex fluid collection below the right ischial tuberosity. There is some slight chronic appearing bony truncation as well as discontinuity of the hamstring tendons, I suspect a prior hamstring avulsion. Mixed signal intensity in this collection favoring blood products, by report a prior aspiration did not grow organisms. 2. Right trochanteric bursitis, likewise with some mild complexity potentially from blood products. By report a prior aspiration did not grow organisms. A component of the trochanteric bursitis may be related to avulsion of the gluteus minimus and possibly part of the  gluteus medius tendons. 3. There is some indistinct low T2 signal along the left ischial tuberosity which may partially be residuum from prior left hamstring tendon tear. 4. In addition to diffuse muscular atrophy along the pelvis, there is low-level edema in the gluteus medius and minimus musculature right greater than left, as well as edema tracking along the right hip adductor musculature and right proximal vastus lateralis. Cause uncertain but low-grade myositis is not excluded. 5. Moderate craniocaudad articular cartilage thinning  in the right hip. 6. Pedicle screws in the lower lumbar spine. 7. I do not observe significant marrow edema in the bony pelvis to suggest active osteomyelitis. Electronically Signed   By: Van Clines M.D.   On: 05/02/2017 07:45     Medications:  Scheduled: . DULoxetine  90 mg Oral Daily  . gabapentin  1,200 mg Oral QHS  . gabapentin  600 mg Oral Daily  . hydrocortisone sod succinate (SOLU-CORTEF) inj  50 mg Intravenous Q12H  . LORazepam  0.5 mg Intravenous Once  . metoprolol tartrate  25 mg Oral BID  . potassium chloride  40 mEq Oral Q4H  . [START ON 05/03/2017] predniSONE  40 mg Oral Q breakfast   Continuous: . vancomycin Stopped (05/01/17 2104)   YIF:OYDXAJOINOMVE **OR** acetaminophen, diphenhydrAMINE, HYDROcodone-acetaminophen, methocarbamol, ondansetron **OR** ondansetron (ZOFRAN) IV  Assessment/Plan:  Active Problems:   Adrenal insufficiency (HCC)   PAF (paroxysmal atrial fibrillation) (HCC)   Hypotension   Systemic inflammatory response syndrome (SIRS) (HCC)   Osteomyelitis (HCC)   Stage I pressure ulcer of sacral region   SIRS (systemic inflammatory response syndrome) (HCC)   Hx of adverse drug reaction    Thoracic discitis with sepsis MRI of the thoracic spine did show evidence for discitis and small abscess at T10-11.  Moderate spinal stenosis was noted at that level.  Neurosurgery was consulted.  Appreciate their input.  No need for surgical intervention.  Infectious disease was consulted who did order drainage of the fluid by interventional radiology.  However patient seems very reluctant to undergo this procedure.  Received a call from interventional radiology.  They are unable to aspirate fluid from the thoracic spine.  Discussed with infectious disease.  No need for disc aspiration at this time.  Patient tolerated ceftriaxone test dose.  Will be initiated on ceftriaxone today per infectious disease.  Defer further management of antibiotic to them.  Patient also  is on vancomycin.  Sepsis physiology has resolved.  Pro-calcitonin level was 0.59.   Abnormal fluid collections in the right trochanteric bursa and below the right ischium This was noted during her previous hospitalization as well.  Fluid was aspirated by interventional radiology during that hospitalization however cultures were negative.  It was thought to be hematoma.  There was also some concern for history of tuberosity osteomyelitis.  Patient was seen by infectious disease and was prescribed long-term Levaquin.  Patient underwent MRI of hip this morning.  Complex fluid collection again noted below the ischial tuberosity.  Multiple other findings noted.  It appears that she is followed by Dr. Sharol Given for orthopedic issues.  I have left a message with him and waiting on a call back to discuss these findings.  We will check a CK level.  ESR was 69.  Recent group B strep bacteremia Seen by infectious disease during previous hospitalization.  Was discharged on Levaquin, to be taken until May 2.  ID is following.  Blood cultures negative so far from this hospitalization.  Antibiotic management per ID.  Patient tolerated test dose of ceftriaxone and placed on full dose today.  Hypotension likely due to adrenal insufficiency Initially hypotensive in the ED.  Patient was given fluids.  Since she was on chronic steroid she was also given stress dose steroids.  Blood pressures have improved.  Steroids are being weaned down.  Paroxysmal atrial fibrillation Patient's chads 2 vascular score is 5.  She is not on anticoagulation due to previous history of falls and recurrent bleeding.  Continue home medications.  Heart rate is reasonably well controlled.  Sacral decubitus and foot ulcer Wound care  Normocytic anemia Mild drop in hemoglobin is noted but no overt bleeding is present.  Continue to monitor.  Likely dilutional drop.    Psoriatic arthritis On chronic steroids.  Currently on stress dose steroids.   Also on apremilast at home.  Chronic pain syndrome Noted to be on multiple medications for same at home.  This includes duloxetine, Norco, gabapentin and Robaxin.  Essential hypertension/hypokalemia Her antihypertensives were held due to hypotension and sepsis.  Now blood pressures are in the hypertensive range.  Continue metoprolol.  Resume Cozaar.  Replace potassium.  Diabetes mellitus type 2 No diabetes medication noted under her home medication list.  Osteoporosis Stable  DVT Prophylaxis: SCDs    Code Status: Full code Family Communication: Discussed with the patient Disposition Plan: Management as outlined above.  PT and OT evaluation.  Wait on further ID input.  Will likely need PICC line.    LOS: 2 days   [facility] Pager [phone] 05/02/2017, 10:08 AM  If 7PM-7AM, please contact night-coverage at [email or web address], password [facility]

## 2017-05-02 NOTE — Progress Notes (Signed)
Pharmacy Antibiotic Note  Andrea Yu is a 77 y.o. female admitted on 04/29/2017 with T10-11 discitis.  Pharmacy has been consulted for Rocephin and vancomycin dosing.  PCN allergy- she tolerated Rocephin 250 mg test dose 4/6 and Rocephin 500 mg 4/7.  Total day abx # 3. Afebrile, WBC WNL. SCr 0.62 - stable. IR not able to aspirate 2nd location of discitis.  I d/w pt and she agrees to full dose Rocephin today.   Plan: Rocephin 2 gm IV q24 - discussed with patient Vancomycin 750 mg IV q36  - if to continue vancomycin will need to check levels - texted ID to determine length of therapy F/u renal function, WBC, temp, culture data   Height: 4\' 11"  (149.9 cm) Weight: 155 lb (70.3 kg) IBW/kg (Calculated) : 43.2  Temp (24hrs), Avg:98.2 F (36.8 C), Min:98 F (36.7 C), Max:98.3 F (36.8 C)  Recent Labs  Lab 04/29/17 1759 04/29/17 1845 04/29/17 2146 04/30/17 0312 04/30/17 0610 05/01/17 0452 05/02/17 0420  WBC 18.3*  --   --  13.4*  --  10.3 9.7  CREATININE 0.85 0.90  --  0.79  --  0.61 0.62  LATICACIDVEN  --  2.55* 2.81* 1.7 2.0*  --   --     Estimated Creatinine Clearance: 49.4 mL/min (by C-G formula based on SCr of 0.62 mg/dL).    Allergies  Allergen Reactions  . Penicillins Anaphylaxis    THROAT SWELLING  Has patient had a PCN reaction causing immediate rash, facial/tongue/throat swelling, SOB or lightheadedness with hypotension:  # # YES # #  Has patient had a PCN reaction causing severe rash involving mucus membranes or skin necrosis: # # # YES # # # Has patient had a PCN reaction that required hospitalization:No Has patient had a PCN reaction occurring within the last 10 years:  # # YES # #  If all of the above answers are "NO", then may proceed with Cephalosporin use.   07/02/17 [Denosumab] Other (See Comments)    Severe Pain in the groin area.  . Fosamax [Alendronate Sodium]     UNSPECIFIED REACTION     Antimicrobials this admission: 4/6 azactam >> 4/7 4/6  levaquin >> 4/7 4/6 vancomycin >> 4/6 CTX 250, 4/7 CTX 500 4/7 CTX 2 gm >>  Dose adjustments this admission:  Microbiology results: 3/24 wound (hematoma): NG 3/24 Bcx: GBS 3/24 fluid cx (from trochanteric hematoma): NG 3/24 UCx: E. Coli- pan-sens 3/26 BCx: NG-F  4/5 BCx x2: ngtd 4/5 UCx:  neg FINAL   Thank you for allowing pharmacy to be a part of this patient's care.  4/26, Pharm.D. Herby Abraham 05/02/2017 11:53 AM

## 2017-05-02 NOTE — Plan of Care (Signed)
  Problem: Skin Integrity: Goal: Risk for impaired skin integrity will decrease Outcome: Progressing   

## 2017-05-02 NOTE — Progress Notes (Signed)
Patient presented to [facility] ED 04/29/2017 for back pain.  MRI lumbar spine 04/30/2017: 1. Suboptimal image quality due to motion. 2. There is bone marrow edema at T10-11 suggestive of discitis and osteomyelitis. See thoracic MRI report. 3. Pedicle screw fusion L3-4 L4-5. There is significant foraminal encroachment at both levels due to spurring. 4. Central left-sided disc protrusion L2-3 with severe spinal stenosis. 5. Mild spinal stenosis T12-L1 and moderate spinal stenosis L1-2.  MRI thoracic spine 04/30/2017: 1. Image quality degraded by motion. 2. Extensive multilevel degenerative change throughout the thoracic spine. 3. Findings consistent with discitis and osteomyelitis at T10-11. Paraspinous soft tissue edema. Epidural soft tissue thickening and small abscess on the right contributing to moderate spinal stenosis.  IR consulted by Dr. Ninetta Lights for possible thoracic 10-11 disc aspiration.  Case was reviewed by Dr. Corliss Skains in IR. Not amenable to aspiration in IR due to location in thoracic spine. Both Dr. Luciana Axe and Dr. Rito Ehrlich aware.  Waylan Boga Louk, PA-C 05/02/2017, 10:15 AM

## 2017-05-02 NOTE — Progress Notes (Signed)
[facility]   Reason for visit: Follow up on discitis/osteomyelitis T10-11  Interval History: IR will not do aspiration in the region, neurosurgery does not feel surgical intervention indicated; tolerating test doses of ceftriaxone x 2.  Full dose today.  No fever, WBC 9.7.   Day 4 antibiotics  Physical Exam: Constitutional:  Vitals:   05/02/17 0524 05/02/17 0526  BP: (!) 168/103 (!) 166/75  Pulse: 92   Resp: 18   Temp: 98.3 F (36.8 C)   SpO2: 96%    patient appears in NAD HENT: no thrush Respiratory: Normal respiratory effort; CTA B Cardiovascular: RRR GI: soft, nt, nd  Review of Systems: Constitutional: negative for fevers and chills Gastrointestinal: negative for diarrhea Integument/breast: negative for rash  Lab Results  Component Value Date   WBC 9.7 05/02/2017   HGB 8.5 (L) 05/02/2017   HCT 26.9 (L) 05/02/2017   MCV 89.4 05/02/2017   PLT 535 (H) 05/02/2017    Lab Results  Component Value Date   CREATININE 0.62 05/02/2017   BUN 17 05/02/2017   NA 144 05/02/2017   K 3.1 (L) 05/02/2017   CL 108 05/02/2017   CO2 26 05/02/2017    Lab Results  Component Value Date   ALT 17 04/30/2017   AST 21 04/30/2017   ALKPHOS 109 04/30/2017     Microbiology: Recent Results (from the past 240 hour(s))  Blood culture (routine x 2)     Status: None (Preliminary result)   Collection Time: 04/29/17  6:35 PM  Result Value Ref Range Status   Specimen Description   Final    BLOOD RIGHT FOREARM Performed at [facility], [address] [place], [place] [address]    Special Requests   Final    BOTTLES DRAWN AEROBIC AND ANAEROBIC Blood Culture adequate volume Performed at [facility], [address] [place], [place] [address]    Culture   Final    NO GROWTH 2 DAYS Performed at [facility], [address] [place], [place] [address]    Report Status PENDING  Incomplete  Blood culture (routine  x 2)     Status: None (Preliminary result)   Collection Time: 04/29/17  6:50 PM  Result Value Ref Range Status   Specimen Description   Final    BLOOD RIGHT FOREARM Performed at [facility], [address], [place], [place] [address]    Special Requests   Final    BOTTLES DRAWN AEROBIC AND ANAEROBIC Blood Culture adequate volume Performed at [facility], [address] [place], [place] [address]    Culture   Final    NO GROWTH 2 DAYS Performed at [facility], [address] [place], [place] [address]    Report Status PENDING  Incomplete  Urine culture     Status: None   Collection Time: 04/29/17  9:00 PM  Result Value Ref Range Status   Specimen Description   Final    URINE, RANDOM Performed at [facility], [address] [place], [place] [address]    Special Requests   Final    NONE Performed at [facility], [address], [place], [place] [address]    Culture   Final    NO GROWTH Performed at [facility], [address] [place], [place] [address]    Report Status 05/01/2017 FINAL  Final    Impression/Plan:  1. Discitis/osteomyelitis - no aspiration done.  Most c/w previous process of GBS noted on 2/2 blood cultures.  Tolerating ceftriaxone.  Will plan on 6-8 weeks of ceftriaxone if tolerating.   Will need picc line.    2.  Hip pain - some bursitis, no osteomyelitis or abscess collection on MRI. Potentially some myositis.  Nothing to aspirate.   3.  Allergy - tolerating ceftriaxone. Patient knows to call nurse if she develops any rash, hives, throat swelling.

## 2017-05-02 NOTE — Consult Note (Signed)
WOC Nurse wound consult note Reason for Consult:Nohealing wound to right medial heel, present on admission.  Right buttocks with moisture associated skin damage from urinary incontinence.  Shearing a factor as well.  Wound type: moisture and pressure Pressure Injury POA: Yes Measurement: Right buttocks:  4.2 cm x 1.6 cm  X0.2 cm  Peeling epithelium consistent with blister.  Right medial heel:  1 cm x 0.6 cm x 0.1 cm with periwound callous present.  Wound OZD:GUYQ pink nongranulating Drainage (amount, consistency, odor) minimal serosanguinous Periwound:callous to heel wound Dressing procedure/placement/frequency: Cleanse wound to buttocks and right medial heel with NS and pat dry.  Apply hydrocolloid dressing and change every three days and PRN soilage. Offload pressure to heel with Prevalon boots bilaterally.   Will not follow at this time.  Please re-consult if needed.  Maple Hudson RN BSN CWON Pager [phone]

## 2017-05-03 ENCOUNTER — Inpatient Hospital Stay: Payer: Self-pay

## 2017-05-03 DIAGNOSIS — M4640 Discitis, unspecified, site unspecified: Secondary | ICD-10-CM

## 2017-05-03 LAB — CBC
HCT: 28.9 % — ABNORMAL LOW (ref 36.0–46.0)
HEMOGLOBIN: 9.2 g/dL — AB (ref 12.0–15.0)
MCH: 28.3 pg (ref 26.0–34.0)
MCHC: 31.8 g/dL (ref 30.0–36.0)
MCV: 88.9 fL (ref 78.0–100.0)
PLATELETS: 492 10*3/uL — AB (ref 150–400)
RBC: 3.25 MIL/uL — ABNORMAL LOW (ref 3.87–5.11)
RDW: 15.6 % — ABNORMAL HIGH (ref 11.5–15.5)
WBC: 9.4 10*3/uL (ref 4.0–10.5)

## 2017-05-03 LAB — BASIC METABOLIC PANEL
Anion gap: 11 (ref 5–15)
BUN: 17 mg/dL (ref 6–20)
CHLORIDE: 109 mmol/L (ref 101–111)
CO2: 25 mmol/L (ref 22–32)
CREATININE: 0.65 mg/dL (ref 0.44–1.00)
Calcium: 8.8 mg/dL — ABNORMAL LOW (ref 8.9–10.3)
Glucose, Bld: 90 mg/dL (ref 65–99)
Potassium: 3.9 mmol/L (ref 3.5–5.1)
SODIUM: 145 mmol/L (ref 135–145)

## 2017-05-03 LAB — CK: CK TOTAL: 15 U/L — AB (ref 38–234)

## 2017-05-03 LAB — MAGNESIUM: MAGNESIUM: 2 mg/dL (ref 1.7–2.4)

## 2017-05-03 NOTE — Evaluation (Signed)
Physical Therapy Evaluation Patient Details Name: Andrea Yu MRN: [id number] DOB: [birth year] Today's Date: 05/03/2017   History of Present Illness  77 year old Caucasian female with a past medical history of atrial fibrillation not on anticoagulation due to recurrent falls and previous history of intracranial hemorrhage, history of peripheral neuropathy, psoriatic arthritis on chronic steroids, hypertension, chronic pain, severe osteoporosis, recently hospitalized 04/17/17-04/22/17 for abscess in the right lower extremity along right trochanteric bursa, and right posterior tuberosity with a right femoral head osteomyelitis. 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   Clinical Impression  Pt admitted with above diagnosis. Pt currently with functional limitations due to the deficits listed below (see PT Problem List). Min assist for supine to sit, pt sat at edge of bed x 11 minutes. HR 109-123. 4/10 R sided back pain with sitting.  Recommend return to SNF.  Pt will benefit from skilled PT to increase their independence and safety with mobility to allow discharge to the venue listed below.       Follow Up Recommendations SNF    Equipment Recommendations  None recommended by PT    Recommendations for Other Services       Precautions / Restrictions Precautions Precautions: Fall Restrictions Weight Bearing Restrictions: No      Mobility  Bed Mobility   Bed Mobility: Rolling;Sidelying to Sit;Sit to Sidelying Rolling: Min assist Sidelying to sit: Min assist     Sit to sidelying: Mod assist General bed mobility comments: min A to raise trunk and for LEs into bed; pt sat at edge of bed x 11 minutes (BLEs unsupported, single UE supported)  Transfers                 General transfer comment: NT  Ambulation/Gait                Stairs            Wheelchair Mobility    Modified Rankin (Stroke Patients Only)       Balance    Sitting-balance support: Feet unsupported;Single extremity supported Sitting balance-Leahy Scale: Fair(sat x 11 minutes, required assist for posterior lean for first ~15 seconds)                                       Pertinent Vitals/Pain Pain Assessment: 0-10 Pain Score: 4  Pain Location: R side of back with sitting Pain Descriptors / Indicators: Sore Pain Intervention(s): Limited activity within patient's tolerance;Monitored during session;Repositioned    Home Living Family/patient expects to be discharged to:: Skilled nursing facility Living Arrangements: Spouse/significant other               Additional Comments: was at [facility] for rehab. Prior to this, she was able to do SPT on one foot to get to [facility].  Non ambulatory x 3 years    Prior Function Level of Independence: Needs assistance   Gait / Transfers Assistance Needed: WC transfers with assist at SNF     Comments: assist for adls and transfers at [facility]     Hand Dominance        Extremity/Trunk Assessment   Upper Extremity Assessment Upper Extremity Assessment: Generalized weakness    Lower Extremity Assessment RLE: (knee ext 5/5) RLE Sensation: history of peripheral neuropathy;decreased light touch LLE: (knee ext 5/5) LLE Sensation: decreased light touch;history of peripheral neuropathy    Cervical / Trunk  Assessment Cervical / Trunk Assessment: Kyphotic  Communication   Communication: HOH  Cognition Arousal/Alertness: Awake/alert Behavior During Therapy: WFL for tasks assessed/performed Overall Cognitive Status: Within Functional Limits for tasks assessed                                        General Comments General comments (skin integrity, edema, etc.): HR 109-123 (pt has h/o afib)    Exercises General Exercises - Lower Extremity Long Arc Quad: AROM;Both;10 reps;Seated   Assessment/Plan    PT Assessment Patient needs continued PT services  PT Problem  List Decreased strength;Decreased balance;Decreased mobility;Decreased activity tolerance;Pain       PT Treatment Interventions DME instruction;Functional mobility training;Therapeutic activities;Therapeutic exercise;Balance training;Patient/family education    PT Goals (Current goals can be found in the Care Plan section)  Acute Rehab PT Goals Patient Stated Goal: go to rehab to get strong enough to go home PT Goal Formulation: With patient Time For Goal Achievement: 05/17/17 Potential to Achieve Goals: Fair    Frequency Min 2X/week   Barriers to discharge        Co-evaluation   Reason for Co-Treatment: For patient/therapist safety PT goals addressed during session: Mobility/safety with mobility OT goals addressed during session: ADL's and self-care       AM-PAC PT "6 Clicks" Daily Activity  Outcome Measure Difficulty turning over in bed (including adjusting bedclothes, sheets and blankets)?: Unable Difficulty moving from lying on back to sitting on the side of the bed? : Unable Difficulty sitting down on and standing up from a chair with arms (e.g., wheelchair, bedside commode, etc,.)?: Unable Help needed moving to and from a bed to chair (including a wheelchair)?: Total Help needed walking in hospital room?: Total Help needed climbing 3-5 steps with a railing? : Total 6 Click Score: 6    End of Session   Activity Tolerance: Patient tolerated treatment well Patient left: in bed;with bed alarm set;with call bell/phone within reach Nurse Communication: Mobility status PT Visit Diagnosis: Pain;Muscle weakness (generalized) (M62.81);Other abnormalities of gait and mobility (R26.89)    Time: 1336-1401 PT Time Calculation (min) (ACUTE ONLY): 25 min   Charges:   PT Evaluation $PT Eval Low Complexity: 1 Low     PT G Codes:          Tamala Ser 05/03/2017, 2:10 PM [phone]

## 2017-05-03 NOTE — Progress Notes (Signed)
PHARMACY CONSULT NOTE FOR:  OUTPATIENT  PARENTERAL ANTIBIOTIC THERAPY (OPAT)  Indication: discitis Regimen: Rocephin 2 gm IV q24 End date: May 30th, 2019  IV antibiotic discharge orders are pended. To discharging provider:  please sign these orders via discharge navigator,  Select New Orders & click on the button choice - Manage This Unsigned Work.     Thank you for allowing pharmacy to be a part of this patient's care.  Herby Abraham, Pharm.D. [phone] 05/03/2017 1:06 PM

## 2017-05-03 NOTE — Progress Notes (Signed)
[facility] for Infectious Disease   Reason for visit: Follow up on discitis  Interval History: tolerating Rocephin  Physical Exam: Constitutional:  Vitals:   05/03/17 0652 05/03/17 1244  BP: (!) 179/84 (!) 158/86  Pulse: 93 93  Resp: 18 18  Temp: 97.7 F (36.5 C) 98.2 F (36.8 C)  SpO2: 97% 97%   patient appears in NAD  Impression: disseminated infection.   Plan: 1.  Ceftriaxone through May 30 (8 weeks).  If doing well, may stop a bit sooner.   Weekly cbc, cmp ESR, CRP every 2 weeks to [phone] She has follow up with me already.    I will sign off, thanks.

## 2017-05-03 NOTE — Progress Notes (Signed)
[facility] PROGRESS NOTE  Andrea Yu ZDG:[id number] DOB: 12-17-1940 DOA: 04/29/2017  PCP: Leanna Battles, MD  Brief History/Interval Summary: 77 year old Caucasian female with a past medical history of atrial fibrillation not on anticoagulation due to recurrent falls and previous history of intracranial hemorrhage, history of peripheral neuropathy, psoriatic arthritis on chronic steroids, hypertension, chronic pain, severe osteoporosis, recently hospitalized for management of abscess detected in the right lower extremity along right trochanteric bursa, and right posterior tuberosity with a right femoral head osteomyelitis.  During the last hospitalization this fluid collection was drained.  It was negative for any growth of bacteria.  It was thought to be hematoma.  She had blood cultures positive for group B strep.  Infectious disease was consulted.  She was discharged on prolonged course of oral Levaquin.  Echocardiogram done at that time did not show any vegetation.  Urine cultures grew E. coli.  She was discharged to skilled nursing facility and presented back to worsening back pain.  Found to be septic and was hospitalized.  MRI revealed discitis.  Infectious disease was consulted.  Reason for Visit: T10-11 discitis  Consultants: Neurosurgery.  Infectious disease.  Procedures: None  Antibiotics: Currently on vancomycin and Levaquin and aztreonam Infectious disease administered test dose of ceftriaxone.  Subjective/Interval History: Patient mentions that she is tolerating ceftriaxone.  Denies any complaints.  Pain is reasonably well controlled.  Denies any hip pain in the right side.    ROS: Denies any nausea vomiting  Objective:  Vital Signs  Vitals:   05/02/17 0526 05/02/17 1432 05/02/17 2203 05/03/17 0652  BP: (!) 166/75 (!) 143/59 (!) 153/81 (!) 179/84  Pulse:  92 (!) 103 93  Resp:  _0 Temp:  98.5 F (36.9 C) 98.1 F (36.7 C) 97.7 F (36.5 C)    TempSrc:  Oral Oral Oral  SpO2:  100% 97% 97%  Weight:      Height:        Intake/Output Summary (Last 24 hours) at 05/03/2017 1141 Last data filed at 05/03/2017 0937 Gross per 24 hour  Intake 700 ml  Output 1700 ml  Net -1000 ml   Filed Weights   04/29/17 1842  Weight: 70.3 kg (155 lb)    General appearance: Awake alert.  In no distress Resp: Clear to auscultation bilaterally.  No wheezing rales or rhonchi Cardio: S1-S2 is normal regular.  No S3-S4.  No rubs murmurs or bruit GI: Abdomen remains soft.  Nontender nondistended.  Bowel sounds are present.  No masses organomegaly. Extremities: No edema Neurologic: No focal neurological deficits.  Lab Results:  Data Reviewed: I have personally reviewed following labs and imaging studies  CBC: Recent Labs  Lab 04/29/17 1759 04/29/17 1845 04/30/17 0312 05/01/17 0452 05/02/17 0420 05/03/17 0447  WBC 18.3*  --  13.4* 10.3 9.7 9.4  NEUTROABS 15.4*  --   --   --   --   --   HGB 9.5* 8.8* 8.9* 8.2* 8.5* 9.2*  HCT 30.8* 26.0* 28.6* 25.9* 26.9* 28.9*  MCV 90.6  --  90.8 88.4 89.4 88.9  PLT 618*  --  541* 519* 535* 492*    Basic Metabolic Panel: Recent Labs  Lab 04/29/17 1759 04/29/17 1845 04/30/17 0312 05/01/17 0452 05/02/17 0420 05/03/17 0447  NA 138 139 139 140 144 145  K 4.4 4.3 4.3 3.6 3.1* 3.9  CL 99* 100* 101 106 108 109  CO2 27  --  _1 GLUCOSE 122* 113*  117* 131* 103* 90  BUN 21* _0 CREATININE 0.85 0.90 0.79 0.61 0.62 0.65  CALCIUM 9.7  --  8.7* 8.5* 8.6* 8.8*  MG  --   --  1.8  --   --  2.0  PHOS  --   --  4.0  --   --   --     GFR: Estimated Creatinine Clearance: 49.4 mL/min (by C-G formula based on SCr of 0.65 mg/dL).  Liver Function Tests: Recent Labs  Lab 04/29/17 1759 04/30/17 0312  AST 29 21  ALT 17 17  ALKPHOS 110 109  BILITOT 0.3 0.4  PROT 6.1* 5.9*  ALBUMIN 2.6* 2.5*     Coagulation Profile: Recent Labs  Lab 04/29/17 1835  INR 1.19    Radiology  Studies: Mr Hip Right W Wo Contrast  Result Date: 05/02/2017 CLINICAL DATA:  Right hip and back pain. Discitis-osteomyelitis at T10-11. EXAM: MRI OF THE RIGHT HIP WITHOUT AND WITH CONTRAST TECHNIQUE: Multiplanar, multisequence MR imaging was performed both before and after administration of intravenous contrast. Sagittal postcontrast images were omitted due to patient hypoxia during the exam. CONTRAST:  41m MULTIHANCE GADOBENATE DIMEGLUMINE 529 MG/ML IV SOLN COMPARISON:  CT pelvis from 04/29/2017 FINDINGS: Bones: Pedicle screws at L3-L4-L5 with associated field heterogeneity. Slight bony truncation of the right ischial tuberosity at the hamstring tendon takeoff site. No associated abnormal marrow edema or enhancement at this time. Similarly, there is no overt marrow edema along the right greater trochanter. Articular cartilage and labrum Articular cartilage: Moderate craniocaudad articular cartilage thinning. No focal defect identified. Labrum:  Grossly unremarkable Joint or bursal effusion Joint effusion:  Absent Bursae: Right trochanteric bursal fluid collection with mild complexity measuring 6.5 by 5.8 by 1.9 cm (volume = 38 cm^3). Complex fluid collection extending posterior and inferior to the right ischium in the subcutaneous tissues measuring 5.4 by 5.1 by 6.6 cm (volume = 95 cm^3). Both of these collections have some accentuated T1 signal internally and only faint marginal enhancement. Muscles and tendons Muscles and tendons: There is edema in the right gluteus medius, gluteus minimus, hip adductor,, and proximal vastus lateralis muscles. The right hamstring is torn and avulsion might explain the truncation of the right ischial attachment of the hamstring. There is at least partial tearing of the left hamstring tendon as well. The right gluteus minimus tendon and potentially portions the right gluteus medius tendon are torn. There is fatty atrophy of the gluteus medius and minimus muscles bilaterally.  Other findings Miscellaneous: There is a band of low T2 signal in the subcutaneous tissues posterior to the left ischial tuberosity. Trace presacral edema. IMPRESSION: 1. Complex fluid collection below the right ischial tuberosity. There is some slight chronic appearing bony truncation as well as discontinuity of the hamstring tendons, I suspect a prior hamstring avulsion. Mixed signal intensity in this collection favoring blood products, by report a prior aspiration did not grow organisms. 2. Right trochanteric bursitis, likewise with some mild complexity potentially from blood products. By report a prior aspiration did not grow organisms. A component of the trochanteric bursitis may be related to avulsion of the gluteus minimus and possibly part of the gluteus medius tendons. 3. There is some indistinct low T2 signal along the left ischial tuberosity which may partially be residuum from prior left hamstring tendon tear. 4. In addition to diffuse muscular atrophy along the pelvis, there is low-level edema in the gluteus medius and minimus musculature right greater than left, as well  as edema tracking along the right hip adductor musculature and right proximal vastus lateralis. Cause uncertain but low-grade myositis is not excluded. 5. Moderate craniocaudad articular cartilage thinning in the right hip. 6. Pedicle screws in the lower lumbar spine. 7. I do not observe significant marrow edema in the bony pelvis to suggest active osteomyelitis. Electronically Signed   By: Van Clines M.D.   On: 05/02/2017 07:45     Medications:  Scheduled: . DULoxetine  90 mg Oral Daily  . gabapentin  1,200 mg Oral QHS  . gabapentin  600 mg Oral Daily  . LORazepam  0.5 mg Intravenous Once  . losartan  100 mg Oral Daily  . metoprolol succinate  50 mg Oral Daily  . predniSONE  40 mg Oral Q breakfast   Continuous: . cefTRIAXone (ROCEPHIN)  IV Stopped (05/02/17 1426)   HAL:[id number] **OR** acetaminophen,  diphenhydrAMINE, HYDROcodone-acetaminophen, methocarbamol, ondansetron **OR** ondansetron (ZOFRAN) IV  Assessment/Plan:  Active Problems:   Adrenal insufficiency (HCC)   PAF (paroxysmal atrial fibrillation) (HCC)   Hypotension   Systemic inflammatory response syndrome (SIRS) (HCC)   Osteomyelitis (HCC)   Stage I pressure ulcer of sacral region   SIRS (systemic inflammatory response syndrome) (HCC)   Hx of adverse drug reaction    Thoracic discitis with sepsis MRI of the thoracic spine did show evidence for discitis and small abscess at T10-11.  Moderate spinal stenosis was noted at that level.  Neurosurgery was consulted.  Appreciate their input.  No need for surgical intervention.  Infectious disease was consulted who did order drainage of the fluid by interventional radiology.  However patient seems very reluctant to undergo this procedure.  Official radiology is unable to do aspiration from the thoracic spine.  Discussed again with infectious disease.  No need for aspiration.  Patient did have positive blood cultures during her last admission which was thought to be the main organism responsible for discitis as well.  Plan is to give prolonged treatment course of 6-8 weeks.  Blood cultures repeated during this hospital stay have been negative so far.  Pro-calcitonin level was 0.59.  Sepsis physiology resolved.  Should be okay to place PICC line.  Will discuss with ID.  Abnormal fluid collections in the right trochanteric bursa and below the right ischium This was noted during her previous hospitalization as well.  Fluid was aspirated by interventional radiology during that hospitalization however cultures were negative.  It was thought to be hematoma.  There was also some concern for history of tuberosity osteomyelitis.  Patient was seen by infectious disease and was prescribed long-term Levaquin.  Patient underwent MRI of hip.  Complex fluid collection again noted below the ischial  tuberosity.  Multiple other findings noted.  She is followed by Dr. Sharol Given for orthopedic issues.  I had a long conversation with him regarding these findings.  He looked at her imaging studies as well.  Patient does not have any symptoms at this time in that area.  He does not recommend any further workup or treatment for now.  He will be happy to see the patient back in his office.  ESR was 69.  CK level is 15.   Recent group B strep bacteremia Seen by infectious disease during previous hospitalization.  Was discharged on Levaquin, to be taken until May 2.  ID is following.  Blood cultures negative so far from this hospitalization.  Appears the plan is for prolonged treatment with ceftriaxone.  Patient tolerated test dose of ceftriaxone and  placed on full dose.  Hypotension likely due to adrenal insufficiency Initially hypotensive in the ED.  Patient was given fluids.  Since she was on chronic steroid she was also given stress dose steroids.  Blood pressures have improved.  Steroids to be weaned down to her home dose of 5 mg once daily.  Paroxysmal atrial fibrillation Patient's chads 2 vascular score is 5.  She is not on anticoagulation due to previous history of falls and recurrent bleeding.  Continue home medications.  Heart rate is reasonably well controlled.  Sacral decubitus and foot ulcer Wound care  Normocytic anemia Mild drop in hemoglobin is noted but no overt bleeding is present.  Continue to monitor.  Likely dilutional drop.    Psoriatic arthritis On chronic steroids.  Currently on stress dose steroids.  Also on apremilast at home.  Chronic pain syndrome Noted to be on multiple medications for same at home.  This includes duloxetine, Norco, gabapentin and Robaxin.  Essential hypertension/hypokalemia Her antihypertensives were held due to hypotension and sepsis.  Now blood pressures are in the hypertensive range.  Her metoprolol and Cozaar have been resumed.  Monitor blood pressures  closely.  Potassium is normal today.  Diabetes mellitus type 2 No diabetes medication noted under her home medication list.  Osteoporosis Stable  DVT Prophylaxis: SCDs    Code Status: Full code Family Communication: Discussed with the patient Disposition Plan: We will discuss with ID.  Will order PICC line if they clear.  Anticipate discharge tomorrow back to her SNF  LOS: 3 days   [facility] Pager [phone] 05/03/2017, 11:41 AM  If 7PM-7AM, please contact night-coverage at [email or web address], password [email or web address]

## 2017-05-03 NOTE — Evaluation (Signed)
Occupational Therapy Evaluation Patient Details Name: Andrea Yu MRN: [id number] DOB: [birth year] Today's Date: 05/03/2017    History of Present Illness 77 year old Caucasian female with a past medical history of atrial fibrillation not on anticoagulation due to recurrent falls and previous history of intracranial hemorrhage, history of peripheral neuropathy, psoriatic arthritis on chronic steroids, hypertension, chronic pain, severe osteoporosis, recently hospitalized 04/17/17-04/22/17 for abscess in the right lower extremity along right trochanteric bursa, and right posterior tuberosity with a right femoral head osteomyelitis. 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    Clinical Impression   Pt was admitted for the above. She was transferred to snf for rehab after last admission and is very motivated to return there to get stronger so she can go home and be with her husband whom she describes as having health problems.  She had assist for adls at SNF. Goals will focus on toilet transfers and bed level ADLs    Follow Up Recommendations  SNF    Equipment Recommendations  None recommended by OT    Recommendations for Other Services       Precautions / Restrictions Precautions Precautions: Fall Restrictions Weight Bearing Restrictions: No   Pt has heel wound; doesn't place weight on leg     Mobility Bed Mobility   Bed Mobility: Rolling;Sidelying to Sit;Sit to Sidelying Rolling: Min assist Sidelying to sit: Min assist     Sit to sidelying: Mod assist General bed mobility comments: min A to raise trunk and for LEs into bed; pt sat at edge of bed x 11 minutes (BLEs unsupported, single UE supported)  Transfers                 General transfer comment: NT    Balance   Sitting-balance support: Feet unsupported;Single extremity supported Sitting balance-Leahy Scale: Fair(sat x 11 minutes, required assist for posterior lean for first ~15  seconds)                                     ADL either performed or assessed with clinical judgement   ADL Overall ADL's : Needs assistance/impaired Eating/Feeding: Set up;Sitting   Grooming: Set up;Sitting   Upper Body Bathing: Minimal assistance;Sitting   Lower Body Bathing: Maximal assistance(sit to roll)   Upper Body Dressing : Minimal assistance;Sitting   Lower Body Dressing: Total assistance;Bed level                 General ADL Comments: pt sat EOB and ate some of her lunch.  She did not want to perform SPT today     Vision         Perception     Praxis      Pertinent Vitals/Pain Pain Assessment: 0-10 Pain Score: 4  Pain Location: R side of back with sitting Pain Descriptors / Indicators: Sore Pain Intervention(s): Limited activity within patient's tolerance;Monitored during session;Repositioned     Hand Dominance     Extremity/Trunk Assessment Upper Extremity Assessment Upper Extremity Assessment: Generalized weakness   Lower Extremity Assessment RLE: (knee ext 5/5) RLE Sensation: history of peripheral neuropathy;decreased light touch LLE: (knee ext 5/5) LLE Sensation: decreased light touch;history of peripheral neuropathy   Cervical / Trunk Assessment Cervical / Trunk Assessment: Kyphotic   Communication Communication Communication: HOH   Cognition Arousal/Alertness: Awake/alert Behavior During Therapy: WFL for tasks assessed/performed Overall Cognitive Status: Within Functional Limits for tasks  assessed                                     General Comments  HR [id number] (pt has h/o afib)    Exercises    Shoulder Instructions      Home Living Family/patient expects to be discharged to:: Skilled nursing facility Living Arrangements: Spouse/significant other                               Additional Comments: was at [facility] for rehab. Prior to this, she was able to do SPT on one foot to get  to [facility].  Non ambulatory x 3 years      Prior Functioning/Environment Level of Independence: Needs assistance  Gait / Transfers Assistance Needed: WC transfers with assist at SNF     Comments: assist for adls and transfers at [facility]        OT Problem List: Decreased strength;Decreased activity tolerance;Pain;Decreased knowledge of use of DME or AE;Decreased knowledge of precautions;Cardiopulmonary status limiting activity      OT Treatment/Interventions: Self-care/ADL training;Energy conservation;Therapeutic activities;Patient/family education    OT Goals(Current goals can be found in the care plan section) Acute Rehab OT Goals Patient Stated Goal: go to rehab to get strong enough to go home OT Goal Formulation: With patient Time For Goal Achievement: 05/17/17 Potential to Achieve Goals: Good ADL Goals Pt Will Transfer to Toilet: with min assist;stand pivot transfer;bedside commode Additional ADL Goal #1: pt will perform bed mobility at supervision level for adls and in preparation for toilet transfers  OT Frequency: Min 2X/week   Barriers to D/C:            Co-evaluation PT/OT/SLP Co-Evaluation/Treatment: Yes Reason for Co-Treatment: For patient/therapist safety PT goals addressed during session: Mobility/safety with mobility OT goals addressed during session: ADL's and self-care      AM-PAC PT "6 Clicks" Daily Activity     Outcome Measure Help from another person eating meals?: A Little Help from another person taking care of personal grooming?: A Little Help from another person toileting, which includes using toliet, bedpan, or urinal?: A Lot Help from another person bathing (including washing, rinsing, drying)?: A Lot Help from another person to put on and taking off regular upper body clothing?: A Little Help from another person to put on and taking off regular lower body clothing?: Total 6 Click Score: 14   End of Session    Activity Tolerance: Patient  tolerated treatment well Patient left: in bed;with call bell/phone within reach;with bed alarm set  OT Visit Diagnosis: Muscle weakness (generalized) (M62.81)                Time: 5681-2751 OT Time Calculation (min): 24 min Charges:  OT General Charges $OT Visit: 1 Visit OT Evaluation $OT Eval Low Complexity: 1 Low G-Codes:     Ward, OTR/L [phone] 05/03/2017  Mignon Bechler 05/03/2017, 3:02 PM

## 2017-05-03 NOTE — Care Management Important Message (Signed)
Important Message  Patient Details  Name: LAMEKA DISLA MRN: [id number] Date of Birth: [birth year]   Medicare Important Message Given:  Yes    Caren Macadam 05/03/2017, 11:40 AMImportant Message  Patient Details  Name: KARIS RILLING MRN: [id number] Date of Birth: 1940/07/29   Medicare Important Message Given:  Yes    Caren Macadam 05/03/2017, 11:40 AM

## 2017-05-03 NOTE — Progress Notes (Signed)
Pt followed by CSW for discharge to SNF.

## 2017-05-04 ENCOUNTER — Inpatient Hospital Stay (HOSPITAL_COMMUNITY): Payer: Medicare Other

## 2017-05-04 DIAGNOSIS — M8610 Other acute osteomyelitis, unspecified site: Secondary | ICD-10-CM

## 2017-05-04 LAB — CULTURE, BLOOD (ROUTINE X 2)
Culture: NO GROWTH
Culture: NO GROWTH
SPECIAL REQUESTS: ADEQUATE
Special Requests: ADEQUATE

## 2017-05-04 MED ORDER — SODIUM CHLORIDE 0.9 % IV SOLN: 2.0000 g | INTRAVENOUS | 0 refills | Status: DC

## 2017-05-04 MED ORDER — HEPARIN SOD (PORK) LOCK FLUSH 100 UNIT/ML IV SOLN
250.0000 [IU] | INTRAVENOUS | Status: AC | PRN
  Administered 2017-05-04: 250 [IU]

## 2017-05-04 MED ORDER — HYDROCODONE-ACETAMINOPHEN 5-325 MG PO TABS: 1.0000 | ORAL_TABLET | ORAL | 0 refills | Status: DC | PRN

## 2017-05-04 MED ORDER — CEFTRIAXONE IV (FOR PTA / DISCHARGE USE ONLY): 2.0000 g | INTRAVENOUS | 0 refills | Status: DC

## 2017-05-04 MED ORDER — LACTINEX PO CHEW: 1.0000 | CHEWABLE_TABLET | Freq: Three times a day (TID) | ORAL | 1 refills | Status: DC

## 2017-05-04 MED ORDER — PREDNISONE 5 MG PO TABS: ORAL_TABLET | ORAL | 0 refills | Status: DC

## 2017-05-04 MED ORDER — ACETAMINOPHEN 325 MG PO TABS: 650.0000 mg | ORAL_TABLET | Freq: Four times a day (QID) | ORAL | 1 refills | Status: DC | PRN

## 2017-05-04 MED ORDER — SODIUM CHLORIDE 0.9% FLUSH: 10.0000 mL | INTRAVENOUS | Status: DC | PRN

## 2017-05-04 MED ORDER — ONDANSETRON HCL 4 MG PO TABS: 4.0000 mg | ORAL_TABLET | Freq: Four times a day (QID) | ORAL | 0 refills | Status: DC | PRN

## 2017-05-04 NOTE — Progress Notes (Signed)
Occupational Therapy Treatment Patient Details Name: Andrea Yu MRN: [id number] DOB: 04/24/1940 Today's Date: 05/04/2017    History of present illness 77 year old Caucasian female with a past medical history of atrial fibrillation not on anticoagulation due to recurrent falls and previous history of intracranial hemorrhage, history of peripheral neuropathy, psoriatic arthritis on chronic steroids, hypertension, chronic pain, severe osteoporosis, recently hospitalized 04/17/17-04/22/17 for abscess in the right lower extremity along right trochanteric bursa, and right posterior tuberosity with a right femoral head osteomyelitis. 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    OT comments  Pt wanted to sit EOB and performed exercises. ADLs were done earlier.  Pt enjoyed sitting up about 20 minutes:  She was tired when she felt ready to return to supine  Follow Up Recommendations  SNF    Equipment Recommendations  None recommended by OT    Recommendations for Other Services      Precautions / Restrictions Precautions Precautions: Fall Precaution Comments: pt states she doesn't place weight on RLE, heel wound Restrictions Weight Bearing Restrictions: No       Mobility Bed Mobility     Rolling: Min assist Sidelying to sit: Min assist     Sit to sidelying: Mod assist General bed mobility comments: min A to raise trunk and for LEs into bed; pt sat at edge of bed x 11 minutes (BLEs unsupported, single UE supported)  Transfers                      Balance   Sitting-balance support: Feet unsupported;Single extremity supported Sitting balance-Leahy Scale: Fair                                     ADL either performed or assessed with clinical judgement   ADL                                         General ADL Comments: Pt sat EOB approximately 20 minutes performing AROM exercises and stretching.  ADLs  already done earlier     Vision       Perception     Praxis      Cognition Arousal/Alertness: Awake/alert Behavior During Therapy: WFL for tasks assessed/performed Overall Cognitive Status: Within Functional Limits for tasks assessed                                          Exercises Exercises: (AROM shoulders/scapula)   Shoulder Instructions       General Comments      Pertinent Vitals/ Pain       Pain Score: 4  Pain Location: R side of back with sitting Pain Descriptors / Indicators: Sore Pain Intervention(s): Limited activity within patient's tolerance;Monitored during session;Repositioned  Home Living                                          Prior Functioning/Environment              Frequency  Min 2X/week        Progress Toward Goals  OT  Goals(current goals can now be found in the care plan section)  Progress towards OT goals: Progressing toward goals     Plan      Co-evaluation                 AM-PAC PT "6 Clicks" Daily Activity     Outcome Measure   Help from another person eating meals?: None Help from another person taking care of personal grooming?: A Little Help from another person toileting, which includes using toliet, bedpan, or urinal?: A Lot Help from another person bathing (including washing, rinsing, drying)?: A Lot Help from another person to put on and taking off regular upper body clothing?: A Little Help from another person to put on and taking off regular lower body clothing?: Total 6 Click Score: 15    End of Session    OT Visit Diagnosis: Muscle weakness (generalized) (M62.81)   Activity Tolerance Patient tolerated treatment well   Patient Left in bed;with call bell/phone within reach;with bed alarm set   Nurse Communication          Time: 3846-6599 OT Time Calculation (min): 23 min  Charges: OT General Charges $OT Visit: 1 Visit OT Treatments $Therapeutic  Activity: 8-22 mins  Andrea Yu, OTR/L [phone] 05/04/2017   Andrea Yu 05/04/2017, 3:19 PM

## 2017-05-04 NOTE — Progress Notes (Signed)
Peripherally Inserted Central Catheter/Midline Placement  The IV Nurse has discussed with the patient and/or persons authorized to consent for the patient, the purpose of this procedure and the potential benefits and risks involved with this procedure.  The benefits include less needle sticks, lab draws from the catheter, and the patient may be discharged home with the catheter. Risks include, but not limited to, infection, bleeding, blood clot (thrombus formation), and puncture of an artery; nerve damage and irregular heartbeat and possibility to perform a PICC exchange if needed/ordered by physician.  Alternatives to this procedure were also discussed.  B[employer] PICC patient education guide, fact sheet on infection prevention and patient information card has been provided to patient /or left at bedside.    PICC/Midline Placement Documentation        Timmothy Sours 05/04/2017, 10:53 AM

## 2017-05-04 NOTE — Discharge Instructions (Signed)
1) Ceftriaxone 2 gm iv daily through May 30 (8 weeks). CBC and CMP every Week, ESR, CRP every 2 weeks to [phone]----- 2)Follow up with Dr Linus Salmons (Infectiousl Disease) in 2 weeks

## 2017-05-04 NOTE — Progress Notes (Signed)
Patient is stable for discharge. Discharge instructions and medications have been reviewed with Marchelle Folks, RN at [facility] and all questions answered. AVS and prescriptions will be sent with the patient. Report given to [place], Charity fundraiser. Awaiting [facility] transport.   Arta Bruce [facility] 05/04/2017 4:15 PM

## 2017-05-04 NOTE — Discharge Summary (Signed)
Andrea Yu, is a 77 y.o. female  DOB November 24, 1940  MRN [id number].  Admission date:  04/29/2017  Admitting Physician  Toy Baker, MD  Discharge Date:  05/04/2017   Primary MD  Leanna Battles, MD  Recommendations for primary care physician for things to follow:    1) Ceftriaxone 2 gm iv daily through May 30 (8 weeks). CBC and CMP every Week, ESR, CRP every 2 weeks to [phone]----- 2)Follow up with Dr Linus Salmons (Infectiousl Disease) in 2 weeks   Admission Diagnosis  Adrenal insufficiency ([place]) [E27.40] Severe sepsis ([place]) [A41.9, R65.20]   Discharge Diagnosis  Adrenal insufficiency ([place]) [E27.40] Severe sepsis ([place]) [A41.9, R65.20]    Active Problems:   Adrenal insufficiency (HCC)   PAF (paroxysmal atrial fibrillation) (HCC)   Hypotension   Systemic inflammatory response syndrome (SIRS) (HCC)   Osteomyelitis (HCC)   Stage I pressure ulcer of sacral region   SIRS (systemic inflammatory response syndrome) (HCC)   Hx of adverse drug reaction      Past Medical History:  Diagnosis Date  . A-fib ([place])    "just after knee 2nd OR" (11/07/2012)  . Anemia   . Arthritis    RA , SACROTIC ARTHRITIS  . Complication of anesthesia    found to be in afib when she woke up  . Dysrhythmia   . GERD (gastroesophageal reflux disease)   . Head injury, closed, with concussion   . History of blood transfusion    "after 1 of my knee ORs and today" (11/07/2012)  . Hypertension   . Peripheral neuropathy    "from back OR in 2005; in hands and feet" (11/07/2012)  . Psoriatic arthritis ([place])    on Prednisone Rx    Past Surgical History:  Procedure Laterality Date  . ANKLE FUSION Right 01/07/2016   Procedure: Right Tibiocalcaneal Fusion;  Surgeon: Newt Minion, MD;  Location: [place];  Service: Orthopedics;  Laterality: Right;  . BACK SURGERY    . CATARACT EXTRACTION W/ INTRAOCULAR LENS  IMPLANT,  BILATERAL Bilateral 2007  . CHOLECYSTECTOMY  2000's  . COLONOSCOPY N/A 11/10/2012   Procedure: COLONOSCOPY;  Surgeon: Beryle Beams, MD;  Location: [place];  Service: Endoscopy;  Laterality: N/A;  . ESOPHAGOGASTRODUODENOSCOPY N/A 11/10/2012   Procedure: ESOPHAGOGASTRODUODENOSCOPY (EGD);  Surgeon: Beryle Beams, MD;  Location: [facility] ENDOSCOPY;  Service: Endoscopy;  Laterality: N/A;  . EYE SURGERY    . JOINT REPLACEMENT    . POSTERIOR LUMBAR FUSION  2005   "got a rod and 4 pins in there" (11/07/2012)  . REPLACEMENT TOTAL KNEE BILATERAL Bilateral 2007  . TRANSTHORACIC ECHOCARDIOGRAM  05/24/2006  . TUBAL LIGATION  1974  . VAGINAL HYSTERECTOMY  1983       HPI  from the history and physical done on the day of admission:    HPI: Andrea Yu is a 77 y.o. female with medical history significant of paroxysmal atrial fibrillation not on anticoagulation due to recurrent falls and intracranial bleeding, peripheral neuropathy, psoriatic arthritis on chronic steroids,  hypertension, chronic pain, severe osteoporosis, prior history of right distal fibula osteomyelitis status post right tibial calcaneal fusion on 01/07/2016,    abscesses along the right trochanteric bursa and right ischial tuberosity with right femoral head osteomyelitis and sepsis secondary to group B strep bacteremia       Presented with   2 week hx of back pain and spasm. Not helped by Vicodin at SNF. Reports started initially at the same time as she was admitted for UTI and felt to be related at the time. But UTI was treated and improved ut back pain continued. Associated nausea chills no fevers no diarrhea have had urinary frequency no weakness in her wounds no bowel incontinence  Regarding pertinent Chronic problems: patient had a fracture in June 2018 due to fall with poor healing osteo formation  right tibial calcaneal fusion on 01/07/2016  In March was admitted with AMS and found to have sepsis like physiology NOted to  have fluid collection right trochanteric bursa and right ischial tuberosity with right femoral head osteomyelitis and sepsis secondary to group B strep bacteremia.Orthopedic surgery was consulted,sp. drainage by IR on 3/24, findings compatible with hematoma. Culture was negative  . Blood culture on 3/24 = group B strep. Repeat blood culture 3/26 negative  . Infectious disease was consulted. Patient has anaphylactic reactions to penicillin. She has been recommended for oral Levaquin 750 mg for prolonged course, last date 05/26/17. 2D echo showed preserved EF no evidence of vegetation.  Urine did grow e.coli at that time.  She has long standing hx of psoriatic arthritis on chronic prednisone. And Apremilast.   Known hx of A. fib not on aticoagulation  due to risk of falls and hx of head bleed.     While in ER:  Noted to be hypotensive code sepsis initiated started on broad-spectrum IV noted to have elevated lactic acid and given stress dose steroid.started on Vancomycin and Levaquin and aztreonamStarted on    Hospital Course:  Reason for Visit: T10-11 discitis   Brief History/Interval Summary: 77 year old Caucasian female with a past medical history of atrial fibrillation not on anticoagulation due to recurrent falls and previous history of intracranial hemorrhage, history of peripheral neuropathy, psoriatic arthritis on chronic steroids, hypertension, chronic pain, severe osteoporosis, recently hospitalized for management of abscess detected in the right lower extremity along right trochanteric bursa, and right posterior tuberosity with a right femoral head osteomyelitis.  During the last hospitalization this fluid collection was drained.  It was negative for any growth of bacteria.  It was thought to be hematoma.  She had blood cultures positive for group B strep.  Infectious disease was consulted.  She was discharged on prolonged course of oral Levaquin.  Echocardiogram done at that time  did not show any vegetation.  Urine cultures grew E. coli.  She was discharged to skilled nursing facility and presented back to worsening back pain.  Found to be septic and was hospitalized.  MRI revealed discitis.  Infectious disease was consulted.    Plan: 1)Thoracic discitis with sepsis MRI of the thoracic spine did show evidence for discitis and small abscess at T10-11.  Moderate spinal stenosis was noted at that level.  Neurosurgery was consulted.  Appreciate their input.  No need for surgical intervention.  Infectious disease was consulted who did order drainage of the fluid by interventional radiology.  However patient seems very reluctant to undergo this procedure.    Interventional radiology is unable to do aspiration from the thoracic spine.  Discussed again with infectious disease.  No need for aspiration.  Patient did have positive blood cultures during her last admission which was thought to be the main organism responsible for discitis as well.  Plan is to give prolonged treatment course of 6-8 weeks.  Blood cultures repeated during this hospital stay have been negative so far.  Pro-calcitonin level was 0.59.  Sepsis physiology resolved.  Dr Linus Salmons from ID recommends  Ceftriaxone 2 gm iv daily through May 30 (8 weeks). CBC and CMP every Week, ESR, CRP every 2 weeks to [phone]-----   2)Abnormal fluid collections in the right trochanteric bursa and below the right ischium This was noted during her previous hospitalization as well.  Fluid was aspirated by interventional radiology during that hospitalization however cultures were negative.  It was thought to be hematoma.  There was also some concern for history of tuberosity osteomyelitis.  Patient was seen by infectious disease and was prescribed long-term Levaquin.  Patient underwent MRI of hip.  Complex fluid collection again noted below the ischial tuberosity.  Multiple other findings noted.  She is followed by Dr. Sharol Given for orthopedic  issues.  Patient does not have any symptoms at this time in that area. Dr Sharol Given does not recommend any further workup or treatment for now.  He will be happy to see the patient back in his office.  ESR was 69.  CK level is 15.   Recent group B strep bacteremia Seen by infectious disease during previous hospitalization.  Was discharged on Levaquin.  Blood cultures negative so far from this hospitalization.  Appears the plan is for prolonged treatment with ceftriaxone.  Patient tolerated test dose of ceftriaxone and placed on full dose,  Ceftriaxone 2 gm iv daily through May 30 (8 weeks). CBC and CMP every Week, ESR, CRP every 2 weeks to [phone]-----  Hypotension likely due to adrenal insufficiency Initially hypotensive in the ED.  Patient was given fluids.  Since she was on chronic steroid she was also given stress dose steroids.  Blood pressures have improved.  Steroids to be weaned down to her home dose of 5 mg once daily.  Paroxysmal atrial fibrillation Patient's chads 2 vascular score is 5.  She is not on anticoagulation due to previous history of falls and recurrent bleeding.  Continue home medications.  Heart rate is reasonably well controlled.  Sacral decubitus and foot ulcer Wound care  Normocytic anemia Mild drop in hemoglobin is noted but no overt bleeding is present.  Continue to monitor.  Likely dilutional drop.    Psoriatic arthritis On chronic steroids.  Currently on stress dose steroids.  Also on apremilast at home.  Chronic pain syndrome Noted to be on multiple medications for same at home.  This includes duloxetine, Norco, gabapentin and Robaxin.  Essential hypertension/hypokalemia Her antihypertensives were held due to hypotension and sepsis.  Now blood pressures are in the hypertensive range.  Her metoprolol and Cozaar have been resumed.  Monitor blood pressures closely.  Potassium is normal today.  Osteoporosis- Stable  H/o Glucose  intolerance/Hyperglycemia-   suspect related to steroid use   Consultants: Neurosurgery.  Infectious disease.  Procedures: Picc Line  Antibiotics: Treated with iv vancomycin and Levaquin and aztreonam Infectious disease administered test dose of ceftriaxone-Ceftriaxone 2 gm iv daily through May 30 (8 weeks). CBC and CMP every Week, ESR, CRP every 2 weeks to [phone]-----  Discharge Condition: SNF  Follow UP  Contact information for after-discharge care    Destination  [facility] .   Service:  Skilled Nursing Contact information: [address] [place] [phone]              Diet and Activity recommendation:  As advised  Discharge Instructions    Discharge Instructions    Diet - low sodium heart healthy   Complete by:  As directed    Discharge instructions   Complete by:  As directed    1) Ceftriaxone 2 gm iv daily through May 30 (8 weeks). CBC and CMP every Week, ESR, CRP every 2 weeks to [phone]----- 2)Follow up with Dr Linus Salmons (Infectiousl Disease) in 2 weeks   Home infusion instructions [place] May follow [place] Dosing Protocol; May administer Cathflo as needed to maintain patency of vascular access device.; Flushing of vascular access device: per [facility] Protocol: 0.9% NaCl pre/post medica...   Complete by:  As directed    Instructions:  May follow [place] Dosing Protocol   Instructions:  May administer Cathflo as needed to maintain patency of vascular access device.   Instructions:  Flushing of vascular access device: per [facility] Protocol: 0.9% NaCl pre/post medication administration and prn patency; Heparin 100 u/ml, 67m for implanted ports and Heparin 10u/ml, 537mfor all other central venous catheters.   Instructions:  May follow [facility] Anaphylaxis Protocol for First Dose Administration in the home: 0.9% NaCl at 25-50 ml/hr to maintain IV access for protocol meds. Epinephrine 0.3 ml  IV/IM PRN and Benadryl 25-50 IV/IM PRN s/s of anaphylaxis.   Instructions:  [facility] Coordinator (RN) to assist per patient IV care needs in the home PRN.   Increase activity slowly   Complete by:  As directed        Discharge Medications     Allergies as of 05/04/2017      Reactions   Penicillins Anaphylaxis   THROAT SWELLING Has patient had a PCN reaction causing immediate rash, facial/tongue/throat swelling, SOB or lightheadedness with hypotension:  # # YES # #  Has patient had a PCN reaction causing severe rash involving mucus membranes or skin necrosis: # # # YES # # # Has patient had a PCN reaction that required hospitalization:No Has patient had a PCN reaction occurring within the last 10 years:  # # YES # #  If all of the above answers are "NO", then may proceed with Cephalosporin use.   Prolia [denosumab] Other (See Comments)   Severe Pain in the groin area.   Fosamax [alendronate Sodium]    UNSPECIFIED REACTION       Medication List    STOP taking these medications   levofloxacin 750 MG tablet Commonly known as:  LEVAQUIN     TAKE these medications   acetaminophen 325 MG tablet Commonly known as:  TYLENOL Take 2 tablets (650 mg total) by mouth every 4 (four) hours as needed. What changed:    how much to take  when to take this  reasons to take this   acetaminophen 325 MG tablet Commonly known as:  TYLENOL Take 2 tablets (650 mg total) by mouth every 6 (six) hours as needed for mild pain (or Fever >/= 101). What changed:  You were already taking a medication with the same name, and this prescription was added. Make sure you understand how and when to take each.   aspirin EC 81 MG tablet Take 81 mg by mouth every evening.   cefTRIAXone IVPB Commonly known as:  ROCEPHIN Inject 2 g into the vein daily. Indication:  Discitis Last Day of Therapy:  06/23/17 Labs - Once weekly:  CBC/D and BMP, Labs - Every other week:  ESR and CRP     DILT-XR 180 MG 24 hr capsule Generic drug:  diltiazem Take 180 mg by mouth at bedtime.   DULoxetine 30 MG capsule Commonly known as:  CYMBALTA Take 30 mg by mouth daily. Take along with 60 mg capsule=90 mg   DULoxetine 60 MG capsule Commonly known as:  CYMBALTA Take 60 mg by mouth daily. Take along with 42m capsule to make a total of 9105m  FORTEO 600 MCG/2.4ML Soln Generic drug:  Teriparatide (Recombinant) Inject 20 mcg into the skin daily.   furosemide 80 MG tablet Commonly known as:  LASIX Take 80 mg by mouth.   gabapentin 600 MG tablet Commonly known as:  NEURONTIN Take 600-1,200 mg by mouth 2 (two) times daily. 1 tablet in morning and 2 tablets at night   HYDROcodone-acetaminophen 5-325 MG tablet Commonly known as:  NORCO/VICODIN Take 1-2 tablets by mouth every 4 (four) hours as needed for moderate pain or severe pain. What changed:  reasons to take this   lactobacillus acidophilus & bulgar chewable tablet Chew 1 tablet by mouth 3 (three) times daily with meals.   losartan 100 MG tablet Commonly known as:  COZAAR Take 100 mg by mouth daily.   Magnesium 250 MG Tabs Take 1 tablet by mouth every evening.   methocarbamol 500 MG tablet Commonly known as:  ROBAXIN Take 1 tablet (500 mg total) by mouth every 8 (eight) hours as needed for muscle spasms.   metoprolol succinate 50 MG 24 hr tablet Commonly known as:  TOPROL-XL Take 1 tablet (50 mg total) by mouth daily. Take with or immediately following a meal.   multivitamin with minerals Tabs tablet Take 1 tablet by mouth daily.   mupirocin ointment 2 % Commonly known as:  BACTROBAN Apply 1 application topically 2 (two) times daily. Apply to the affected area 2 times a day   omeprazole 20 MG capsule Commonly known as:  PRILOSEC Take 20 mg by mouth every evening.   ondansetron 4 MG tablet Commonly known as:  ZOFRAN Take 1 tablet (4 mg total) by mouth every 6 (six) hours as needed for nausea.   OTEZLA 30 MG  Tabs Generic drug:  Apremilast Take 30 mg by mouth 2 (two) times daily.   potassium chloride SA 20 MEQ tablet Commonly known as:  K-DUR,KLOR-CON Take 20 mEq by mouth 2 (two) times daily.   predniSONE 5 MG tablet Commonly known as:  DELTASONE Take 10 mg daily x 5 days, then 5 mg daily What changed:    how much to take  how to take this  when to take this  additional instructions   PROAIR HFA 108 (90 Base) MCG/ACT inhaler Generic drug:  albuterol Inhale 1-2 puffs into the lungs every 6 (six) hours as needed for wheezing or shortness of breath.   silver sulfADIAZINE 1 % cream Commonly known as:  SILVADENE Apply 1 application topically daily.   vitamin C 500 MG tablet Commonly known as:  ASCORBIC ACID Take 500 mg by mouth 2 (two) times daily.   Vitamin D (Ergocalciferol) 50000 units Caps capsule Commonly known as:  DRISDOL Take 50,000 Units by mouth every Tuesday.   zinc sulfate 220 (50 Zn) MG capsule Take 220 mg by mouth daily.  Home Infusion Instuctions  (From admission, onward)        Start     Ordered   05/04/17 0000  Home infusion instructions [facility] May follow [place] Dosing Protocol; May administer Cathflo as needed to maintain patency of vascular access device.; Flushing of vascular access device: per [facility] Protocol: 0.9% NaCl pre/post medica...    Question Answer Comment  Instructions May follow [place] Dosing Protocol   Instructions May administer Cathflo as needed to maintain patency of vascular access device.   Instructions Flushing of vascular access device: per [facility] Protocol: 0.9% NaCl pre/post medication administration and prn patency; Heparin 100 u/ml, 18m for implanted ports and Heparin 10u/ml, 573mfor all other central venous catheters.   Instructions May follow [facility] Anaphylaxis Protocol for First Dose Administration in the home: 0.9% NaCl at 25-50 ml/hr to maintain IV access for protocol meds. Epinephrine 0.3 ml IV/IM  PRN and Benadryl 25-50 IV/IM PRN s/s of anaphylaxis.   Instructions [facility] Infusion Coordinator (RN) to assist per patient IV care needs in the home PRN.      05/04/17 1408      Major procedures and Radiology Reports - PLEASE review detailed and final reports for all details, in brief -   Dg Chest 2 View  Result Date: 04/29/2017 CLINICAL DATA:  Cough with abnormal right lower lobe physical exam. EXAM: CHEST - 2 VIEW COMPARISON:  04/17/2017 FINDINGS: Heart size is normal. There is aortic atherosclerosis. The lungs are clear. The vascularity is normal. No effusions. Chronic degenerative changes of the shoulder and spine as seen previously. IMPRESSION: No active disease by radiography. Specifically, no right lower lobe disease identified. Electronically Signed   By: MaNelson Chimes.D.   On: 04/29/2017 17:39   Dg Chest 2 View  Result Date: 04/17/2017 CLINICAL DATA:  Fever and severe back pain. EXAM: CHEST - 2 VIEW COMPARISON:  11/06/2012. FINDINGS: Poor inspiration. Normal sized heart. Clear lungs. Mild scoliosis. Lower thoracic spine degenerative changes. Superior bright creation of both humeral heads with bony remodeling and bilateral glenohumeral degenerative changes. IMPRESSION: 1. No acute abnormality. 2. Large bilateral chronic rotator cuff tears and bilateral shoulder degenerative changes. Electronically Signed   By: StClaudie Revering.D.   On: 04/17/2017 08:40   Dg Thoracic Spine 2 View  Result Date: 04/17/2017 CLINICAL DATA:  Pain after falling EXAM: THORACIC SPINE 2 VIEWS COMPARISON:  None. FINDINGS: Multilevel thoracic vertebral body height loss. No acute fracture. Normal alignment. IMPRESSION: No acute fracture or static subluxation of the thoracic spine. Electronically Signed   By: KeUlyses Jarred.D.   On: 04/17/2017 06:30   Dg Lumbar Spine Complete  Result Date: 04/17/2017 CLINICAL DATA:  Fall with back pain EXAM: LUMBAR SPINE - COMPLETE 4+ VIEW COMPARISON:  Lumbar spine MRI  09/01/2015 FINDINGS: Posterior fusion hardware at L3-L5 is unchanged alignment. No abnormal lucency. No acute fracture. Multilevel upper lumbar vertebral body height loss is unchanged. IMPRESSION: No acute fracture of the lumbar spine. Electronically Signed   By: KeUlyses Jarred.D.   On: 04/17/2017 06:32   Mr Thoracic Spine Wo Contrast  Addendum Date: 04/30/2017   ADDENDUM REPORT: 04/30/2017 11:20 ADDENDUM: These results were called by telephone at the time of interpretation on 04/30/2017 at 11:19 am to Dr. KrMaryland Pink who verbally acknowledged these results. Electronically Signed   By: ChFranchot Gallo.D.   On: 04/30/2017 11:20   Result Date: 04/30/2017 CLINICAL DATA:  Back pain.  Abnormal x-ray. EXAM:  MRI THORACIC SPINE WITHOUT CONTRAST TECHNIQUE: Multiplanar, multisequence MR imaging of the thoracic spine was performed. No intravenous contrast was administered. COMPARISON:  Chest two-view 04/29/2017, chest CT 04/17/2017. CT abdomen pelvis 04/29/2017 FINDINGS: Alignment: Anterolisthesis T8-9. Mild retrolisthesis T5-6 and T6-7 and T12-L1. Vertebrae: Bone marrow edema throughout T10 and T11 centered at the disc space. Endplate erosions at the disc space with hyperintense signal in the disc. Bone erosion in the endplates is noted on recent CT and findings are compatible with discitis and osteomyelitis. Mild chronic compression fracture of T3. Hemangioma T5 vertebral body. No evidence of metastatic disease. Cord:  Negative for cord compression Paraspinal and other soft tissues: Small bilateral pleural effusions. Paraspinous soft tissue edema at T10-11 compatible with acute infection. Small epidural fluid collection in the canal compatible with abscess. Disc levels: Image quality degraded by motion. Extensive disc degeneration and spondylosis throughout the thoracic spine. Central and left-sided spurring at T4-5 with mild spinal stenosis. Left-sided osteophyte T5-6 Central left-sided spurring T6-7. Disc and facet  degeneration T8-9 with mild spinal stenosis Disc degeneration and spondylosis at T9-10. Diffuse bone marrow edema and endplate erosive changes at T10-11 compatible with infection. Paraspinous soft tissue edema. Ventral epidural soft tissue thickening and fluid collection on the right compatible with small epidural abscess. Cord flattening with moderate spinal stenosis. Mild degenerative change at T11-T12 IMPRESSION: Image quality degraded by motion. 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 Paraspinous soft tissue edema. Epidural soft tissue thickening and small abscess on the right contributing to moderate spinal stenosis. Electronically Signed: By: Franchot Gallo M.D. On: 04/30/2017 11:12   Mr Lumbar Spine Wo Contrast  Result Date: 04/30/2017 CLINICAL DATA:  Back pain greater than 6 weeks. Patient refused intravenous contrast. EXAM: MRI LUMBAR SPINE WITHOUT CONTRAST TECHNIQUE: Multiplanar, multisequence MR imaging of the lumbar spine was performed. No intravenous contrast was administered. COMPARISON:  Lumbar MRI 09/01/2015 FINDINGS: Segmentation:  Normal Alignment: Retrolisthesis T12-L1, L1-2, L3-4. 8 mm anterolisthesis L4-5. Vertebrae:  Negative for lumbar fracture or mass. Bone marrow edema involving T10 and T11 disc spaces with edema in the T10-11 disc space. Findings suggestive of discitis and osteomyelitis. This area is better evaluated on the thoracic MRI. Conus medullaris and cauda equina: Conus extends to the L1-2 level. Conus and cauda equina appear normal. Paraspinal and other soft tissues: Paraspinous soft tissues normal. No fluid collection or mass Disc levels: T12-L1: Moderate disc and facet degeneration with mild spinal stenosis L1-2: Moderate disc and facet degeneration with moderate spinal stenosis and moderate foraminal stenosis bilaterally. Stenosis has progressed since the prior study. L2-3:  Moderately large central and left-sided disc protrusion is unchanged. Bilateral facet hypertrophy. Severe spinal stenosis similar to the prior study L3-4: Pedicle screw fusion. Solid interbody fusion. Moderate foraminal stenosis bilaterally due to spurring L4-5: Pedicle screw fusion with 8 mm anterolisthesis similar to the prior study. Posterior decompression. Moderate foraminal encroachment bilaterally. L5-S1: Mild disc and facet degeneration. Image quality degraded by motion IMPRESSION: Suboptimal image quality due to motion There is bone marrow edema at T10-11 suggestive of discitis and osteomyelitis. See thoracic MRI report Pedicle screw fusion L3-4 L4-5. There is significant foraminal encroachment at both levels due to spurring Central left-sided disc protrusion L2-3 with severe spinal stenosis. Mild spinal stenosis T12-L1 and moderate spinal stenosis L1-2 Electronically Signed   By: Franchot Gallo M.D.   On: 04/30/2017 11:03   Mr Hip Right W Wo Contrast  Result Date: 05/02/2017 CLINICAL DATA:  Right hip and back pain. Discitis-osteomyelitis at T10-11. EXAM: MRI OF THE RIGHT HIP WITHOUT AND WITH CONTRAST TECHNIQUE: Multiplanar, multisequence MR imaging was performed both before and after administration of intravenous contrast. Sagittal postcontrast images were omitted due to patient hypoxia during the exam. CONTRAST:  24m MULTIHANCE GADOBENATE DIMEGLUMINE 529 MG/ML IV SOLN COMPARISON:  CT pelvis from 04/29/2017 FINDINGS: Bones: Pedicle screws at L3-L4-L5 with associated field heterogeneity. Slight bony truncation of the right ischial tuberosity at the hamstring tendon takeoff site. No associated abnormal marrow edema or enhancement at this time. Similarly, there is no overt marrow edema along the right greater trochanter. Articular cartilage and labrum Articular cartilage: Moderate craniocaudad articular cartilage thinning. No focal defect identified. Labrum:  Grossly unremarkable Joint or bursal effusion  Joint effusion:  Absent Bursae: Right trochanteric bursal fluid collection with mild complexity measuring 6.5 by 5.8 by 1.9 cm (volume = 38 cm^3). Complex fluid collection extending posterior and inferior to the right ischium in the subcutaneous tissues measuring 5.4 by 5.1 by 6.6 cm (volume = 95 cm^3). Both of these collections have some accentuated T1 signal internally and only faint marginal enhancement. Muscles and tendons Muscles and tendons: There is edema in the right gluteus medius, gluteus minimus, hip adductor,, and proximal vastus lateralis muscles. The right hamstring is torn and avulsion might explain the truncation of the right ischial attachment of the hamstring. There is at least partial tearing of the left hamstring tendon as well. The right gluteus minimus tendon and potentially portions the right gluteus medius tendon are torn. There is fatty atrophy of the gluteus medius and minimus muscles bilaterally. Other findings Miscellaneous: There is a band of low T2 signal in the subcutaneous tissues posterior to the left ischial tuberosity. Trace presacral edema. IMPRESSION: 1. Complex fluid collection below the right ischial tuberosity. There is some slight chronic appearing bony truncation as well as discontinuity of the hamstring tendons, I suspect a prior hamstring avulsion. Mixed signal intensity in this collection favoring blood products, by report a prior aspiration did not grow organisms. 2. Right trochanteric bursitis, likewise with some mild complexity potentially from blood products. By report a prior aspiration did not grow organisms. A component of the trochanteric bursitis may be related to avulsion of the gluteus minimus and possibly part of the gluteus medius tendons. 3. There is some indistinct low T2 signal along the left ischial tuberosity which may partially be residuum from prior left hamstring tendon tear. 4. In addition to diffuse muscular atrophy along the pelvis, there is  low-level edema in the gluteus medius and minimus musculature right greater than left, as well as edema tracking along the right hip adductor musculature and right proximal vastus lateralis. Cause uncertain but low-grade myositis is not excluded. 5. Moderate craniocaudad articular cartilage thinning in the right hip. 6. Pedicle screws in the lower lumbar spine. 7. I do not observe significant marrow edema in the bony pelvis to suggest active osteomyelitis. Electronically Signed   By: WVan ClinesM.D.   On: 05/02/2017 07:45   Ct Abdomen Pelvis W Contrast  Result Date: 04/29/2017 CLINICAL DATA:  Back spasms for 2 weeks worsening today. Nausea and vomiting. Abdominal distention. EXAM: CT ABDOMEN AND PELVIS WITH CONTRAST TECHNIQUE: Multidetector CT imaging of the abdomen and pelvis was performed using the standard protocol following bolus administration of intravenous contrast. CONTRAST:  1016mISOVUE-300 IOPAMIDOL (ISOVUE-300) INJECTION 61% COMPARISON:  04/17/2017 FINDINGS: Lower chest: Right coronary artery atherosclerotic calcification. Small but new left pleural effusion. Hepatobiliary: Hypodensity  in segment 4 of the liver along the falciform ligament most likely due to focal fatty infiltration. Cholecystectomy. Pancreas: Unremarkable Spleen: Small hypodensities in the spleen are nonspecific and may be related to the relatively early phase of contrast on the initial acquisition series. These are not well seen on the more delayed series. Adrenals/Urinary Tract: The adrenal glands appear normal. Exophytic 10 by 9 mm right kidney upper pole lesion measures at fluid density on image 29/2 and is probably a benign cyst. Stomach/Bowel: Sigmoid colon diverticulosis without active diverticulitis. Vascular/Lymphatic: Aortoiliac atherosclerotic vascular disease. No pathologic adenopathy. Reproductive: Uterus absent.  Adnexa unremarkable. Other: No supplemental non-categorized findings. Musculoskeletal: Complex fluid  collection extending posteriorly from the right ischial tuberosity with mildly enhancing margins. On the lowest image this associated fluid collection measures 5.6 by 5.4 cm, smaller than on 04/17/2017 but still substantial. There is a truncated/shaved appearance of the posterior margin of the ischial tuberosity. A smaller right trochanteric bursal fluid collection has mildly enhancing margins and measures 3.6 by 1.5 by 7.1 cm (volume = 20 cm^3). Again, this is smaller than on 04/17/2017, but has undergone interval drainage. Residual infection not excluded. Lower thoracic and lumbar spondylosis and degenerative disc disease with posterolateral rod and pedicle screw fixation at L3-L4-L5. Chronic grade 1 anterolisthesis at L4-5 and interbody fusion at L3-4. Continued loss of disc space most striking at L2-3 and T12-L1. Stable endplate irregularities in the lower thoracic spine and at L1-2. Periumbilical hernias contain omental adipose tissue. IMPRESSION: 1. Abnormal fluid collections in the right trochanteric bursa and below the right ischium, with some irregularity of the right ischial tuberosity stable from the prior exam. By report, the prior fluid cultures of both of these collections aspirated on 04/17/2017 were negative for organisms. Both of these collections are smaller than on 04/17/2017, although still substantial. Stable truncated/shaved appearance of the right ischial tuberosity. 2. 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 Expectation for lumbar impingement at the L3-4 and L4-5 levels, and on the left at L5-S1. There is likely also impingement at L1-2, L2-3, and T10-11. 3. Small but new left pleural effusion. 4. No cause for abdominal distention is identified. No dilated bowel. 5. Other imaging findings of potential clinical significance: Aortic Atherosclerosis (ICD10-I70.0). Coronary atherosclerosis. Sigmoid diverticulosis  without active diverticulitis. Electronically Signed   By: Van Clines M.D.   On: 04/29/2017 20:54   Ct Aspiration  Result Date: 04/17/2017 INDICATION: 77 year old with large fluid collections in the right pelvis. One collection in the right trochanteric region and a second collection in the right inferior gluteal region adjacent to the ischial tuberosity. Concern for underlying infection. EXAM: CT-GUIDED DRAIN PLACEMENT WITHIN THE RIGHT ISCHIAL FLUID COLLECTION CT-GUIDED ASPIRATION OF THE RIGHT FEMORAL TROCHANTERIC FLUID COLLECTION MEDICATIONS: The patient is currently admitted to the hospital and receiving intravenous antibiotics. The antibiotics were administered within an appropriate time frame prior to the initiation of the procedure. ANESTHESIA/SEDATION: 1.5 mg IV Versed 75 mcg IV Fentanyl Moderate Sedation Time:  42 minutes The patient was continuously monitored during the procedure by the interventional radiology nurse under my direct supervision. COMPLICATIONS: None immediate. TECHNIQUE: Informed written consent was obtained from the patient after a thorough discussion of the procedural risks, benefits and alternatives. All questions were addressed. Maximal Sterile Barrier Technique was utilized including caps, mask, sterile gowns, sterile gloves, sterile drape, hand hygiene and skin antiseptic. A timeout was performed prior to the initiation of the procedure. PROCEDURE: Patient was placed  prone. Images through the lower pelvis were obtained. Skin was marked over the right ischial and right trochanteric fluid collections. Right ischial region was prepped and draped in a sterile fashion. Skin was anesthetized with 1% lidocaine. 18 gauge trocar needle was directed into this collection with CT guidance and bloody fluid was aspirated. A stiff Amplatz wire was advanced into the collection. The tract was dilated to accommodate a 10 [place] drain. 100 mL of bloody fluid was removed. No additional fluid  could be aspirated. Follow up CT images were obtained at this point. Fluid sample collected for culture. The drain was left in place at this time. Attention was directed to the right trochanteric fluid collection. A new sterile field was created. Skin was anesthetized with 1% lidocaine. [place] catheter was directed into the right trochanteric fluid collection. 70 mL of bloody fluid was removed. Follow up CT images were obtained and confirmed decompression of the right trochanteric fluid collection. This Yueh catheter was removed. Fluid sample sent for culture. Attention was directed back to the 10 [place] drain. No additional fluid could be removed. Therefore, the drain was cut and completely removed. Bandages placed at both puncture sites. FINDINGS: Heterogeneous collection in the right inferior gluteal region and adjacent to the right ischial tuberosity. The collection looks like a hematoma on these non contrast images. The low-density component of the collection was successfully drained following placement of the 10 French drain. Findings are most compatible with a hematoma. The drain was removed in order to decrease the risk of hematoma contamination or infection. Bloody fluid was removed from the right femoral trochanteric fluid collection. Majority of the right trochanteric fluid was removed. IMPRESSION: CT-guided drainage of the right trochanteric and right gluteal fluid collections. Findings are most compatible with hematomas. Samples from both collections were sent for culture. 10 French drain was placed within the right ischial/gluteal collection. However, this drain was removed at the end of the procedure in order to prevent hematoma contamination or infection. Electronically Signed   By: Markus Daft M.D.   On: 04/17/2017 16:08   Dg Chest Port 1 View  Result Date: 05/04/2017 CLINICAL DATA:  Central catheter placement EXAM: PORTABLE CHEST 1 VIEW COMPARISON:  April 29, 2017 FINDINGS: Central  catheter tip is at the cavoatrial junction. No pneumothorax. There is no edema or consolidation. Heart size and pulmonary vascularity are normal. No adenopathy. There is aortic atherosclerosis. There is extensive arthropathy in both shoulders with superior migration of each humeral head. IMPRESSION: Central catheter tip at cavoatrial junction.  No pneumothorax. No edema or consolidation. Stable cardiac silhouette. There is aortic atherosclerosis. 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 Aortic Atherosclerosis (ICD10-I70.0). Electronically Signed   By: Lowella Grip III M.D.   On: 05/04/2017 11:38   Ct Angio Chest/abd/pel For Dissection W And/or Wo Contrast  Result Date: 04/17/2017 CLINICAL DATA:  [place] a couple days ago at home, tonight developed back spasms and pain, generalized acute abdominal pain, history GERD, hypertension paroxysmal atrial fibrillation EXAM: CT ANGIOGRAPHY CHEST, ABDOMEN AND PELVIS TECHNIQUE: Multidetector CT imaging through the chest, abdomen and pelvis was performed using the standard protocol during bolus administration of intravenous contrast. Multiplanar reconstructed images and MIPs were obtained and reviewed to evaluate the vascular anatomy. CONTRAST:  100 ML ISOVUE-370 IOPAMIDOL (ISOVUE-370) INJECTION 76% IV. No oral contrast. COMPARISON:  CT abdomen and pelvis 03/29/2013 FINDINGS: CTA CHEST FINDINGS Cardiovascular: Scattered atherosclerotic calcifications  aorta, proximal great vessels and coronary arteries. No evidence of intramural hematoma on precontrast imaging. Normal aortic enhancement following contrast without aneurysm or dissection. No pericardial effusion. Pulmonary arteries grossly patent. Mediastinum/Nodes: Esophagus unremarkable. Base of cervical region normal appearance. No thoracic adenopathy. Lungs/Pleura: Minimal dependent atelectasis in the posterior lungs. Lungs  otherwise clear. No acute infiltrate, pleural effusion, or pneumothorax. Tiny calcified pulmonary granuloma RIGHT middle lobe image 51. Musculoskeletal: Scattered degenerative disc disease changes thoracic spine Review of the MIP images confirms the above findings. CTA ABDOMEN AND PELVIS FINDINGS VASCULAR Aorta: Atherosclerotic calcification and tortuosity of abdominal aorta. No evidence of aneurysm or dissection. Celiac: Mild atherosclerotic plaque formation at origin of celiac artery, estimated less than 50% narrowed SMA: Significant atherosclerotic plaque at origin of SMA, with greater than 50% narrowing Renals: Atherosclerotic plaque at origins of BILATERAL renal arteries probably greater than 50% bilaterally IMA: IMA patent Inflow: Scattered calcified plaques throughout the iliac and visualized femoral systems without aneurysm Veins: Unopacified at CTA imaging.  Scattered pelvic phleboliths. Review of the MIP images confirms the above findings. NON-VASCULAR Hepatobiliary: Gallbladder surgically absent. Mild fatty infiltration of liver without mass. Pancreas: Normal appearance Spleen: Normal appearance Adrenals/Urinary Tract: Adrenal glands, kidneys, ureters, and bladder normal appearance Stomach/Bowel: Appendix not visualized but no pericecal inflammatory process seen. Sigmoid diverticulosis without evidence of diverticulitis. Stomach and bowel loops otherwise unremarkable. Lymphatic: No adenopathy Reproductive: Uterus surgically absent with normal sized ovaries Other: Small umbilical and supraumbilical ventral hernias containing fat. No free intraperitoneal air or fluid. Musculoskeletal: Bones demineralized with evidence of prior lumbar fusion L3-L5. Mild chronic anterolisthesis at L4-L5. Multilevel degenerative disc disease changes of the thoracolumbar spine. Fluid collection identified lateral to the RIGHT hip joint overlying the femoral neck and greater trochanter, maximal dimensions of 6.6 x 4.2 x 7.2 cm.  A separate fluid collection is identified posterior inferior to the RIGHT ischium at the inferior RIGHT buttock, 8.8 x 5.2 cm in axial dimensions image 185, extending at least 4.4 cm in craniocaudal length beyond the inferior extent of exam. Overlying skin thickening and subcutaneous infiltration at the posterior collection. Questionable bone destruction at the RIGHT ischium raises question of decubitus ulcer with potential osteomyelitis. No definite hip joint effusion. Review of the MIP images confirms the above findings. IMPRESSION: Scattered atherosclerotic calcifications involving the thoracic and abdominal aorta, coronary arteries, and abdominal visceral artery origins. No evidence of aortic aneurysm or dissection. 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 No acute intrathoracic abnormalities. Two RIGHT pelvic fluid collections are identified: 6.6 x 4.2 x 7.2 cm lateral to and abutting the RIGHT greater trochanter question trochanteric bursitis; and a second collection posteriorly overlying and extending caudal to the RIGHT ischial spine 8.8 x 5.2 x >4.4 cm in size, with overlying skin thickening, subcutaneous infiltration, and potential mild ischial destruction, highly suspicious for osteomyelitis and overlying abscess, potentially related to decubitus ulcer? Electronically Signed   By: Lavonia Dana M.D.   On: 04/17/2017 09:32   Ct Image Guided Drainage By Percutaneous Catheter  Result Date: 04/17/2017 INDICATION: 77 year old with large fluid collections in the right pelvis. One collection in the right trochanteric region and a second collection in the right inferior gluteal region adjacent to the ischial tuberosity. Concern for underlying infection. EXAM: CT-GUIDED DRAIN PLACEMENT WITHIN THE RIGHT ISCHIAL FLUID COLLECTION CT-GUIDED ASPIRATION OF THE RIGHT FEMORAL TROCHANTERIC FLUID COLLECTION MEDICATIONS: The patient is currently admitted to the hospital and receiving intravenous antibiotics.  The antibiotics were administered within an appropriate time  frame prior to the initiation of the procedure. ANESTHESIA/SEDATION: 1.5 mg IV Versed 75 mcg IV Fentanyl Moderate Sedation Time:  42 minutes The patient was continuously monitored during the procedure by the interventional radiology nurse under my direct supervision. COMPLICATIONS: None immediate. TECHNIQUE: Informed written consent was obtained from the patient after a thorough discussion of the procedural risks, benefits and alternatives. All questions were addressed. Maximal Sterile Barrier Technique was utilized including caps, mask, sterile gowns, sterile gloves, sterile drape, hand hygiene and skin antiseptic. A timeout was performed prior to the initiation of the procedure. PROCEDURE: Patient was placed prone. Images through the lower pelvis were obtained. Skin was marked over the right ischial and right trochanteric fluid collections. Right ischial region was prepped and draped in a sterile fashion. Skin was anesthetized with 1% lidocaine. 18 gauge trocar needle was directed into this collection with CT guidance and bloody fluid was aspirated. A stiff Amplatz wire was advanced into the collection. The tract was dilated to accommodate a 10 [place] drain. 100 mL of bloody fluid was removed. No additional fluid could be aspirated. Follow up CT images were obtained at this point. Fluid sample collected for culture. The drain was left in place at this time. Attention was directed to the right trochanteric fluid collection. A new sterile field was created. Skin was anesthetized with 1% lidocaine. [place] catheter was directed into the right trochanteric fluid collection. 70 mL of bloody fluid was removed. Follow up CT images were obtained and confirmed decompression of the right trochanteric fluid collection. This Yueh catheter was removed. Fluid sample sent for culture. Attention was directed back to the 10 [place] drain. No additional fluid could  be removed. Therefore, the drain was cut and completely removed. Bandages placed at both puncture sites. FINDINGS: Heterogeneous collection in the right inferior gluteal region and adjacent to the right ischial tuberosity. The collection looks like a hematoma on these non contrast images. The low-density component of the collection was successfully drained following placement of the 10 French drain. Findings are most compatible with a hematoma. The drain was removed in order to decrease the risk of hematoma contamination or infection. Bloody fluid was removed from the right femoral trochanteric fluid collection. Majority of the right trochanteric fluid was removed. IMPRESSION: CT-guided drainage of the right trochanteric and right gluteal fluid collections. Findings are most compatible with hematomas. Samples from both collections were sent for culture. 10 French drain was placed within the right ischial/gluteal collection. However, this drain was removed at the end of the procedure in order to prevent hematoma contamination or infection. Electronically Signed   By: Markus Daft M.D.   On: 04/17/2017 16:08   [place] Ekg Site Rite  Result Date: 05/03/2017 If Site Rite image not attached, placement could not be confirmed due to current cardiac rhythm.   Micro Results   Recent Results (from the past 240 hour(s))  Blood culture (routine x 2)     Status: None (Preliminary result)   Collection Time: 04/29/17  6:35 PM  Result Value Ref Range Status   Specimen Description   Final    BLOOD RIGHT FOREARM Performed at [facility] [address] [place], [place] [address]    Special Requests   Final    BOTTLES DRAWN AEROBIC AND ANAEROBIC Blood Culture adequate volume Performed at [facility] [address] [place], [place] [address]    Culture   Final    NO GROWTH 4 DAYS Performed  at [facility], [place] [address] [employer], [place] [address]    Report Status  PENDING  Incomplete  Blood culture (routine x 2)     Status: None (Preliminary result)   Collection Time: 04/29/17  6:50 PM  Result Value Ref Range Status   Specimen Description   Final    BLOOD RIGHT FOREARM Performed at [facility] [address], [place], [place] [address]    Special Requests   Final    BOTTLES DRAWN AEROBIC AND ANAEROBIC Blood Culture adequate volume Performed at [facility] [address] [place], [place] [address]    Culture   Final    NO GROWTH 4 DAYS Performed at [facility], [place] [address] [place], [place] [address]    Report Status PENDING  Incomplete  Urine culture     Status: None   Collection Time: 04/29/17  9:00 PM  Result Value Ref Range Status   Specimen Description   Final    URINE, RANDOM Performed at [facility] [address] [place], [place] [address]    Special Requests   Final    NONE Performed at [facility], [place] [address] [place], [place] [address]    Culture   Final    NO GROWTH Performed at [facility], [place] [address] [place], [place] [address]    Report Status 05/01/2017 FINAL  Final    Today   Subjective    Myracle Febres today has no new complaints, No fever  Or chills, No nausea, vomiting, diarrhea           Patient has been seen and examined prior to discharge   Objective   Blood pressure (!) 167/81, pulse 97, temperature 98.1 F (36.7 C), temperature source Oral, resp. rate 16, height _0  (1.499 m), weight 70.3 kg (155 lb), SpO2 97 %.   Intake/Output Summary (Last 24 hours) at 05/04/2017 1413 Last data filed at 05/04/2017 1216 Gross per 24 hour  Intake 540 ml  Output 1600 ml  Net -1060 ml    Exam Gen:- Awake Alert, no acute distress, chronically ill-appearing HEENT:- [place].AT, No sclera icterus Neck-Supple Neck,No JVD,.  Lungs-  CTAB , good air movement CV- S1, S2 normal Abd-  +ve B.Sounds, Abd Soft, No  tenderness,    Extremity/Skin:-Right foot ulcer, sacral decubiti  psych-affect is appropriate, oriented x3 Neuro-generalized weakness without new focal deficits, no tremors   Data Review   CBC w Diff:  Lab Results  Component Value Date   WBC 9.4 05/03/2017   HGB 9.2 (L) 05/03/2017   HCT 28.9 (L) 05/03/2017   PLT 492 (H) 05/03/2017   LYMPHOPCT 7 04/29/2017   MONOPCT 7 04/29/2017   EOSPCT 2 04/29/2017   BASOPCT 0 04/29/2017    CMP:  Lab Results  Component Value Date   NA 145 05/03/2017   K 3.9 05/03/2017   CL 109 05/03/2017   CO2 25 05/03/2017   BUN 17 05/03/2017   CREATININE 0.65 05/03/2017   PROT 5.9 (L) 04/30/2017   ALBUMIN 2.5 (L) 04/30/2017   BILITOT 0.4 04/30/2017   ALKPHOS 109 04/30/2017   AST 21 04/30/2017   ALT 17 04/30/2017  .   Total Discharge time is about 33 minutes  Roxan Hockey M.D on 05/04/2017 at 2:13 PM  [facility]   Office  [phone]  Voice Recognition Viviann Spare dictation system was used to create this note, attempts have been  made to correct errors. Please contact the author with questions and/or clarifications.

## 2017-05-04 NOTE — Clinical Social Work Placement (Signed)
Patient returning to [facility]. Facility aware of patient's discharge and confirmed patient's ability to return. PTAR contacted, patient's family notified. Patient's RN can call report to [phone] room 209, packet complete. CSW signing off, no other needs identified at this time.  CLINICAL SOCIAL WORK PLACEMENT  NOTE  Date:  05/04/2017  Patient Details  Name: Andrea Yu MRN: [id number] Date of Birth: 03-Apr-1940  Clinical Social Work is seeking post-discharge placement for this patient at the Skilled  Nursing Facility level of care (*CSW will initial, date and re-position this form in  chart as items are completed):  Yes   Patient/family provided with [facility] Social Work Department's list of facilities offering this level of care within the geographic area requested by the patient (or if unable, by the patient's family).  Yes   Patient/family informed of their freedom to choose among providers that offer the needed level of care, that participate in Medicare, Medicaid or managed care program needed by the patient, have an available bed and are willing to accept the patient.  Yes   Patient/family informed of [facility]'s ownership interest in [facility] and [facility], as well as of the fact that they are under no obligation to receive care at these facilities.  PASRR submitted to EDS on       PASRR number received on       Existing PASRR number confirmed on       FL2 transmitted to all facilities in geographic area requested by pt/family on 05/01/17     FL2 transmitted to all facilities within larger geographic area on       Patient informed that his/her managed care company has contracts with or will negotiate with certain facilities, including the following:        Yes   Patient/family informed of bed offers received.  Patient chooses bed at [facility] [place]     Physician recommends and patient chooses bed at      Patient to be  transferred to [facility] [place] on 05/04/17.  Patient to be transferred to facility by PTAR     Patient family notified on 05/04/17 of transfer.  Name of family member notified:  Antoinette Borgwardt (left voicemail)     PHYSICIAN       Additional Comment:    _______________________________________________ Antionette Poles, LCSW 05/04/2017, 3:54 PM

## 2017-05-04 NOTE — Care Management Note (Signed)
Case Management Note  Patient Details  Name: Andrea Yu MRN: [id number] Date of Birth: 16-Oct-1940  Subjective/Objective:                    Action/Plan:d/c SNF.   Expected Discharge Date:  05/04/17               Expected Discharge Plan:  Skilled Nursing Facility  In-House Referral:  Clinical Social Work  Discharge planning Services  CM Consult  Post Acute Care Choice:    Choice offered to:     DME Arranged:    DME Agency:     HH Arranged:    HH Agency:     Status of Service:  Completed, signed off  If discussed at [employer], dates discussed:    Additional Comments:  Lanier Clam, RN 05/04/2017, 2:53 PM

## 2017-05-12 ENCOUNTER — Ambulatory Visit (INDEPENDENT_AMBULATORY_CARE_PROVIDER_SITE_OTHER): Payer: Medicare Other | Admitting: Internal Medicine

## 2017-05-12 ENCOUNTER — Encounter: Payer: Self-pay | Admitting: Internal Medicine

## 2017-05-12 DIAGNOSIS — A401 Sepsis due to streptococcus, group B: Secondary | ICD-10-CM

## 2017-05-12 DIAGNOSIS — M8610 Other acute osteomyelitis, unspecified site: Secondary | ICD-10-CM

## 2017-05-12 DIAGNOSIS — Z5181 Encounter for therapeutic drug level monitoring: Secondary | ICD-10-CM | POA: Diagnosis not present

## 2017-05-12 DIAGNOSIS — M4644 Discitis, unspecified, thoracic region: Secondary | ICD-10-CM | POA: Diagnosis not present

## 2017-05-12 LAB — CBC WITH DIFFERENTIAL/PLATELET
BASOS ABS: 112 {cells}/uL (ref 0–200)
Basophils Relative: 0.6 %
EOS ABS: 692 {cells}/uL — AB (ref 15–500)
Eosinophils Relative: 3.7 %
HEMATOCRIT: 36.2 % (ref 35.0–45.0)
Hemoglobin: 11.9 g/dL (ref 11.7–15.5)
LYMPHS ABS: 1197 {cells}/uL (ref 850–3900)
MCH: 28.1 pg (ref 27.0–33.0)
MCHC: 32.9 g/dL (ref 32.0–36.0)
MCV: 85.6 fL (ref 80.0–100.0)
MPV: 10.2 fL (ref 7.5–12.5)
Monocytes Relative: 4.8 %
NEUTROS PCT: 84.5 %
Neutro Abs: 15802 cells/uL — ABNORMAL HIGH (ref 1500–7800)
PLATELETS: 293 10*3/uL (ref 140–400)
RBC: 4.23 10*6/uL (ref 3.80–5.10)
RDW: 15.4 % — AB (ref 11.0–15.0)
TOTAL LYMPHOCYTE: 6.4 %
WBC: 18.7 10*3/uL — ABNORMAL HIGH (ref 3.8–10.8)
WBCMIX: 898 {cells}/uL (ref 200–950)

## 2017-05-12 LAB — COMPREHENSIVE METABOLIC PANEL
AG Ratio: 1.5 (calc) (ref 1.0–2.5)
ALBUMIN MSPROF: 3.7 g/dL (ref 3.6–5.1)
ALKALINE PHOSPHATASE (APISO): 161 U/L — AB (ref 33–130)
ALT: 15 U/L (ref 6–29)
AST: 14 U/L (ref 10–35)
BILIRUBIN TOTAL: 0.4 mg/dL (ref 0.2–1.2)
BUN: 18 mg/dL (ref 7–25)
CALCIUM: 9.9 mg/dL (ref 8.6–10.4)
CHLORIDE: 100 mmol/L (ref 98–110)
CO2: 30 mmol/L (ref 20–32)
CREATININE: 0.82 mg/dL (ref 0.60–0.93)
Globulin: 2.5 g/dL (calc) (ref 1.9–3.7)
Glucose, Bld: 117 mg/dL — ABNORMAL HIGH (ref 65–99)
POTASSIUM: 4.2 mmol/L (ref 3.5–5.3)
Sodium: 142 mmol/L (ref 135–146)
Total Protein: 6.2 g/dL (ref 6.1–8.1)

## 2017-05-12 LAB — SEDIMENTATION RATE: Sed Rate: 19 mm/h (ref 0–30)

## 2017-05-12 NOTE — Assessment & Plan Note (Signed)
On treatment and blood cultures with clearance

## 2017-05-12 NOTE — Progress Notes (Signed)
   Subjective:    Patient ID: Andrea Yu, female    DOB: 21-Apr-1940, 77 y.o.   MRN: [id number]  HPI Her for hsfu.   She developed bacteremia with GBS in March with a large fluid collection and osteomyelitis of the right pelvis and with her penicillin allergy was sent out on oral high dose levaquin.  She returned in April with back pain and MRI with discitis and fluid collection.  She was placed on ceftriaxone and tolerated this well and is on a projected course through May 30.  She continues to tolerate it well with no rash or diarrhea.  Still with some pain in her back at the site of infection.  No associated diarrhea, rash.  Getting muscle relaxant for relief.    Review of Systems  Constitutional: Negative for chills and fever.  Gastrointestinal: Negative for diarrhea and nausea.  Skin: Negative for rash.       Objective:   Physical Exam  Constitutional: She appears well-developed and well-nourished. No distress.  Eyes: No scleral icterus.  Cardiovascular: Normal rate, regular rhythm and normal heart sounds.  No murmur heard. Pulmonary/Chest: Effort normal and breath sounds normal. No respiratory distress.  Skin: No rash noted.   SH: staying at [facility] for rehab.        Assessment & Plan:

## 2017-05-12 NOTE — Assessment & Plan Note (Signed)
Of pelvis, on treatement.

## 2017-05-12 NOTE — Assessment & Plan Note (Signed)
Will continue with ceftriaxone and see her again in about 4 weeks. ESR and CrP today.

## 2017-05-12 NOTE — Assessment & Plan Note (Signed)
Will check cmp, cbc today and I have requested this to be done every week at her facility.

## 2017-05-13 ENCOUNTER — Ambulatory Visit: Payer: Medicare Other | Admitting: Cardiovascular Disease

## 2017-05-24 ENCOUNTER — Telehealth: Payer: Self-pay | Admitting: *Deleted

## 2017-05-24 NOTE — Telephone Encounter (Signed)
Noah from [facility] called to advise that the patient is going home on 05/26/17 on IV Rocephin,  She is supposed to be on the IV until end of May, and she can not give it to herself, nor does she have anyone who can give it to her daily she lives alone. He wants to ask Dr Luciana Axe if there is an oral option for the patient as nursing can not go out to her daily. Advised will ask the doctor and give them a call back once he responds.   Anette Riedel [phone]

## 2017-05-25 NOTE — Telephone Encounter (Signed)
My recommendation is to do the IV treatment.  If she refuses, I would do Keflex 500 mg 4 times a day for 2 months with close follow up with me.   She has a penicillin allergy but is tolerating ceftriaxone (same class) so ok to do.  thanks

## 2017-05-25 NOTE — Telephone Encounter (Signed)
Per Dr Luciana Axe orders written and faxed to [facility] at [facility] for oral Keflex 500 mg 4 times daily #120 for 2 months.

## 2017-05-27 ENCOUNTER — Other Ambulatory Visit: Payer: Self-pay | Admitting: Pharmacist

## 2017-06-01 ENCOUNTER — Telehealth: Payer: Self-pay | Admitting: Pharmacist

## 2017-06-01 NOTE — Telephone Encounter (Signed)
Patient is on ceftriaxone until 5/30 (or maybe keflex if refused IV).  Received labs today from [facility] - patient's absolute neutrophil count is 0. Will send to Dr. Luciana Axe for further instructions.

## 2017-06-01 NOTE — Telephone Encounter (Signed)
See if we can repeat it and if the same and on oral therapy, go back to oral levaquin.  If she has an IV what else could we use?  Daptomycin?

## 2017-06-01 NOTE — Telephone Encounter (Signed)
Called and spoke to [facility] at [facility] and had them repeat a CBC with diff asap (either today or tomorrow morning).    She has group B strep -- could do vancomycin as her SCr is ok right now. Although with her advanced age and other medications (she is on lasix and losartan), dapto may be a good idea.  If still low on repeat, I will have them run it to see if she can afford it.

## 2017-06-03 NOTE — Telephone Encounter (Signed)
Patient's repeat ANC was 0.2.  Spoke to Dr. Linus Salmons and will stop IV Rocephin (verified patient is taking) and start Vancomycin. Per estimated CrCl and age, will start 750 mg IV q12h with close monitoring with twice weekly BMET and vanc trough and weekly CBC, ESR, CRP.  Stop date to remain on 5/30 as she will see Dr. Linus Salmons on 5/20.    Patient is also on lasix and losartan and of advanced age, so will monitor kidney function very closely.  Gave verbal order to [place] at [facility]. She verbalized understanding.

## 2017-06-03 NOTE — Telephone Encounter (Signed)
Upon further looking at her labs, her SCr is slightly higher than previous. Called [name] at [facility] and patient will be dosed once daily - 1500 mg q24h. They will draw labs on Monday and monitor closely.

## 2017-06-06 ENCOUNTER — Encounter (HOSPITAL_COMMUNITY): Payer: Self-pay | Admitting: *Deleted

## 2017-06-06 ENCOUNTER — Emergency Department (HOSPITAL_COMMUNITY): Payer: Medicare Other

## 2017-06-06 ENCOUNTER — Other Ambulatory Visit: Payer: Self-pay

## 2017-06-06 ENCOUNTER — Inpatient Hospital Stay (HOSPITAL_COMMUNITY)
Admission: EM | Admit: 2017-06-06 | Discharge: 2017-06-14 | DRG: 808 | Disposition: A | Discharge status: Skilled Nursing Facility | Payer: Medicare Other | Source: Skilled Nursing Facility | Attending: Internal Medicine | Admitting: Internal Medicine

## 2017-06-06 DIAGNOSIS — R296 Repeated falls: Secondary | ICD-10-CM | POA: Diagnosis present

## 2017-06-06 DIAGNOSIS — T368X5A Adverse effect of other systemic antibiotics, initial encounter: Secondary | ICD-10-CM | POA: Diagnosis present

## 2017-06-06 DIAGNOSIS — L89603 Pressure ulcer of unspecified heel, stage 3: Secondary | ICD-10-CM | POA: Diagnosis present

## 2017-06-06 DIAGNOSIS — B951 Streptococcus, group B, as the cause of diseases classified elsewhere: Secondary | ICD-10-CM | POA: Diagnosis present

## 2017-06-06 DIAGNOSIS — Z8249 Family history of ischemic heart disease and other diseases of the circulatory system: Secondary | ICD-10-CM

## 2017-06-06 DIAGNOSIS — D72829 Elevated white blood cell count, unspecified: Secondary | ICD-10-CM | POA: Diagnosis not present

## 2017-06-06 DIAGNOSIS — F40240 Claustrophobia: Secondary | ICD-10-CM | POA: Diagnosis present

## 2017-06-06 DIAGNOSIS — M4624 Osteomyelitis of vertebra, thoracic region: Secondary | ICD-10-CM | POA: Diagnosis present

## 2017-06-06 DIAGNOSIS — L89151 Pressure ulcer of sacral region, stage 1: Secondary | ICD-10-CM | POA: Diagnosis present

## 2017-06-06 DIAGNOSIS — I480 Paroxysmal atrial fibrillation: Secondary | ICD-10-CM | POA: Diagnosis present

## 2017-06-06 DIAGNOSIS — I4891 Unspecified atrial fibrillation: Secondary | ICD-10-CM | POA: Diagnosis present

## 2017-06-06 DIAGNOSIS — D638 Anemia in other chronic diseases classified elsewhere: Secondary | ICD-10-CM | POA: Diagnosis present

## 2017-06-06 DIAGNOSIS — Z880 Allergy status to penicillin: Secondary | ICD-10-CM

## 2017-06-06 DIAGNOSIS — M4644 Discitis, unspecified, thoracic region: Secondary | ICD-10-CM | POA: Diagnosis present

## 2017-06-06 DIAGNOSIS — L4050 Arthropathic psoriasis, unspecified: Secondary | ICD-10-CM | POA: Diagnosis present

## 2017-06-06 DIAGNOSIS — D702 Other drug-induced agranulocytosis: Principal | ICD-10-CM | POA: Diagnosis present

## 2017-06-06 DIAGNOSIS — Z87891 Personal history of nicotine dependence: Secondary | ICD-10-CM

## 2017-06-06 DIAGNOSIS — G9341 Metabolic encephalopathy: Secondary | ICD-10-CM | POA: Diagnosis present

## 2017-06-06 DIAGNOSIS — I10 Essential (primary) hypertension: Secondary | ICD-10-CM | POA: Diagnosis present

## 2017-06-06 DIAGNOSIS — L02212 Cutaneous abscess of back [any part, except buttock]: Secondary | ICD-10-CM | POA: Diagnosis present

## 2017-06-06 DIAGNOSIS — L89613 Pressure ulcer of right heel, stage 3: Secondary | ICD-10-CM | POA: Diagnosis present

## 2017-06-06 DIAGNOSIS — Y92129 Unspecified place in nursing home as the place of occurrence of the external cause: Secondary | ICD-10-CM

## 2017-06-06 DIAGNOSIS — Z981 Arthrodesis status: Secondary | ICD-10-CM

## 2017-06-06 DIAGNOSIS — D709 Neutropenia, unspecified: Secondary | ICD-10-CM | POA: Diagnosis not present

## 2017-06-06 DIAGNOSIS — T361X5A Adverse effect of cephalosporins and other beta-lactam antibiotics, initial encounter: Secondary | ICD-10-CM | POA: Diagnosis present

## 2017-06-06 DIAGNOSIS — Z87440 Personal history of urinary (tract) infections: Secondary | ICD-10-CM

## 2017-06-06 DIAGNOSIS — R7989 Other specified abnormal findings of blood chemistry: Secondary | ICD-10-CM | POA: Diagnosis present

## 2017-06-06 DIAGNOSIS — R5081 Fever presenting with conditions classified elsewhere: Secondary | ICD-10-CM | POA: Diagnosis present

## 2017-06-06 DIAGNOSIS — R7881 Bacteremia: Secondary | ICD-10-CM | POA: Diagnosis present

## 2017-06-06 DIAGNOSIS — G894 Chronic pain syndrome: Secondary | ICD-10-CM | POA: Diagnosis present

## 2017-06-06 DIAGNOSIS — Z7982 Long term (current) use of aspirin: Secondary | ICD-10-CM

## 2017-06-06 DIAGNOSIS — K219 Gastro-esophageal reflux disease without esophagitis: Secondary | ICD-10-CM | POA: Diagnosis present

## 2017-06-06 DIAGNOSIS — R6510 Systemic inflammatory response syndrome (SIRS) of non-infectious origin without acute organ dysfunction: Secondary | ICD-10-CM

## 2017-06-06 DIAGNOSIS — Z87820 Personal history of traumatic brain injury: Secondary | ICD-10-CM

## 2017-06-06 LAB — CBC WITH DIFFERENTIAL/PLATELET
BASOS ABS: 0.1 10*3/uL (ref 0.0–0.1)
Basophils Relative: 4 %
EOS ABS: 0 10*3/uL (ref 0.0–0.7)
Eosinophils Relative: 1 %
HCT: 32.3 % — ABNORMAL LOW (ref 36.0–46.0)
HEMOGLOBIN: 10.3 g/dL — AB (ref 12.0–15.0)
LYMPHS ABS: 1.1 10*3/uL (ref 0.7–4.0)
LYMPHS PCT: 56 %
MCH: 27 pg (ref 26.0–34.0)
MCHC: 31.9 g/dL (ref 30.0–36.0)
MCV: 84.8 fL (ref 78.0–100.0)
MONOS PCT: 38 %
Monocytes Absolute: 0.7 10*3/uL (ref 0.1–1.0)
NEUTROS ABS: 0 10*3/uL — AB (ref 1.7–7.7)
Neutrophils Relative %: 1 %
Platelets: 541 10*3/uL — ABNORMAL HIGH (ref 150–400)
RBC: 3.81 MIL/uL — ABNORMAL LOW (ref 3.87–5.11)
RDW: 16 % — AB (ref 11.5–15.5)
WBC: 1.9 10*3/uL — AB (ref 4.0–10.5)

## 2017-06-06 LAB — COMPREHENSIVE METABOLIC PANEL
ALT: 11 U/L — ABNORMAL LOW (ref 14–54)
AST: 14 U/L — AB (ref 15–41)
Albumin: 2.6 g/dL — ABNORMAL LOW (ref 3.5–5.0)
Alkaline Phosphatase: 147 U/L — ABNORMAL HIGH (ref 38–126)
Anion gap: 14 (ref 5–15)
BILIRUBIN TOTAL: 0.8 mg/dL (ref 0.3–1.2)
BUN: 5 mg/dL — AB (ref 6–20)
CHLORIDE: 106 mmol/L (ref 101–111)
CO2: 19 mmol/L — ABNORMAL LOW (ref 22–32)
Calcium: 8.7 mg/dL — ABNORMAL LOW (ref 8.9–10.3)
Creatinine, Ser: 0.65 mg/dL (ref 0.44–1.00)
Glucose, Bld: 91 mg/dL (ref 65–99)
POTASSIUM: 3.2 mmol/L — AB (ref 3.5–5.1)
Sodium: 139 mmol/L (ref 135–145)
Total Protein: 6 g/dL — ABNORMAL LOW (ref 6.5–8.1)

## 2017-06-06 LAB — PROTIME-INR
INR: 1.19
PROTHROMBIN TIME: 15 s (ref 11.4–15.2)

## 2017-06-06 LAB — I-STAT CG4 LACTIC ACID, ED: LACTIC ACID, VENOUS: 1.53 mmol/L (ref 0.5–1.9)

## 2017-06-06 MED ORDER — ACETAMINOPHEN 325 MG PO TABS
650.0000 mg | ORAL_TABLET | Freq: Once | ORAL | Status: AC
  Administered 2017-06-07: 650 mg via ORAL
  Filled 2017-06-06: qty 2

## 2017-06-06 MED ORDER — SODIUM CHLORIDE 0.9 % IV BOLUS
1000.0000 mL | Freq: Once | INTRAVENOUS | Status: AC
  Administered 2017-06-07: 1000 mL via INTRAVENOUS

## 2017-06-06 NOTE — ED Provider Notes (Signed)
[facility] EMERGENCY DEPARTMENT Provider Note   CSN: [id number] Arrival date & time: 06/06/17  2237     History   Chief Complaint Chief Complaint  Patient presents with  . Back Pain  . Fever    HPI Andrea Yu is a 77 y.o. female.  The history is provided by the patient, medical records, the nursing home and a friend.    35 y.o. 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  Patient has been in [facility] facility for rehab since discharge from hospital last month.  Daughter at bedside reports she has been doing ok, has had some periods of confusion but yesterday began complaining of worsening pain in her back as well as pain with urination.  Has had ongoing issues with frequent UTI's since march.  They have not noticed fever at facility, low grade temp of 100F on arrival here.  HR AFIB 140-160's, daughter reports she has missed several doses of her home medications recently including her metoprolol.  Has not missed any doses of abx.  No new focal numbness/weakness of the legs, does feel weak all over.  No new urinary/bowel incontinence.  Daughter reports she does have ulceration on right heel now-- seeing wound care clinic for this.  Past Medical History:  Diagnosis Date  . A-fib ([place])    "just after knee 2nd OR" (11/07/2012)  . Anemia   . Arthritis    RA , SACROTIC ARTHRITIS  . Complication of anesthesia    found to be in afib when she woke up  . Dysrhythmia   . GERD (gastroesophageal reflux disease)   . Head injury, closed, with concussion   . History of blood transfusion    "after 1 of my knee ORs and today" (11/07/2012)  . Hypertension   . Peripheral neuropathy    "from back OR in 2005; in hands and feet" (11/07/2012)  . Psoriatic arthritis  ([place])    on Prednisone Rx    Patient Active Problem List   Diagnosis Date Noted  . Discitis of thoracic region 05/12/2017  . Medication monitoring encounter 05/12/2017  . Hx of adverse drug reaction   . SIRS (systemic inflammatory response syndrome) ([place]) 04/30/2017  . Sepsis due to group B Streptococcus ([place]) 04/19/2017  . Stage I pressure ulcer of sacral region 04/19/2017  . Altered mental status 04/17/2017  . Hip osteomyelitis, right ([place]) 04/17/2017  . Pressure injury of skin 04/17/2017  . History of blood transfusion   . Head injury, closed, with concussion   . GERD (gastroesophageal reflux disease)   . Dysrhythmia   . Complication of anesthesia   . Arthritis   . Anemia   . Displaced spiral fracture of shaft of right tibia, subsequent encounter for closed fracture with delayed healing 11/11/2016  . Stage III pressure ulcer of heel ([place]) 05/13/2016  . Non-pressure chronic ulcer of right heel and midfoot limited to breakdown of skin ([place]) 04/15/2016  . 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 Osteomyelitis ([place])   . SAH (subarachnoid hemorrhage) ([place]) 09/14/2014  . Atrial fibrillation with RVR ([place]) 09/14/2014  . Hypertension 09/14/2014  . Laceration of eyebrow, left 09/14/2014  . Fall 09/14/2014  . Gastritis, acute 11/11/2012  . Cellulitis of leg, right 11/11/2012  . Obesity (BMI  30-39.9)- suspected sleep apnea, declines sleep study 11/10/2012  . Psoriatic arthritis ([place]) 11/06/2012  . Iron deficiency anemia- transfused this admission 11/06/2012  . Depression 11/06/2012  . Peripheral neuropathy 11/06/2012  . Shock circulatory on admission 11/06/12 11/06/2012  . Systemic inflammatory response syndrome (SIRS) ([place]) 11/06/2012  . Nausea and vomiting 08/09/2012  . Adrenal insufficiency ([place]) 08/09/2012  . PAF (paroxysmal atrial fibrillation) ([place]) 08/09/2012  . Hypotension 08/09/2012  . Dehydration 08/09/2012    Past Surgical History:  Procedure Laterality Date  .  ANKLE FUSION Right 01/07/2016   Procedure: Right Tibiocalcaneal Fusion;  Surgeon: Newt Minion, MD;  Location: [facility];  Service: Orthopedics;  Laterality: Right;  . BACK SURGERY    . CATARACT EXTRACTION W/ INTRAOCULAR LENS  IMPLANT, BILATERAL Bilateral 2007  . CHOLECYSTECTOMY  2000's  . COLONOSCOPY N/A 11/10/2012   Procedure: COLONOSCOPY;  Surgeon: Beryle Beams, MD;  Location: [facility];  Service: Endoscopy;  Laterality: N/A;  . ESOPHAGOGASTRODUODENOSCOPY N/A 11/10/2012   Procedure: ESOPHAGOGASTRODUODENOSCOPY (EGD);  Surgeon: Beryle Beams, MD;  Location[facility] END[facility]COPY;  Service: Endoscopy;  Laterality: N/A;  . EYE SURGERY    . JOINT REPLACEMENT    . POSTERIOR LUMBAR FUSION  2005   "got a rod and 4 pins in there" (11/07/2012)  . REPLACEMENT TOTAL KNEE BILATERAL Bilateral 2007  . TRANSTHORACIC ECHOCARDIOGRAM  05/24/2006  . TUBAL LIGATION  1974  . VAGINAL HYSTERECTOMY  1983     OB History   None      Home Medications    Prior to Admission medications   Medication Sig Start Date End Date Taking? Authorizing Provider  acetaminophen (TYLENOL) 325 MG tablet Take 2 tablets (650 mg total) by mouth every 4 (four) hours as needed. Patient taking differently: Take 325 mg by mouth every 6 (six) hours as needed (for pain.).  11/11/12   Delfina Redwood, MD  acetaminophen (TYLENOL) 325 MG tablet Take 2 tablets (650 mg total) by mouth every 6 (six) hours as needed for mild pain (or Fever >/= 101). 05/04/17   Roxan Hockey, MD  Apremilast (OTEZLA) 30 MG TABS Take 30 mg by mouth 2 (two) times daily.    [provider]  aspirin EC 81 MG tablet Take 81 mg by mouth every evening.    [provider]  cefTRIAXone (ROCEPHIN) IVPB Inject 2 g into the vein daily. Indication:  Discitis Last Day of Therapy:  06/23/17 Labs - Once weekly:  CBC/D and BMP, Labs - Every other week:  ESR and CRP 05/04/17 06/24/17  Emokpae, Courage, MD  DILT-XR 180 MG 24 hr capsule Take 180 mg by mouth  at bedtime. 04/07/17   [provider]  DULoxetine (CYMBALTA) 30 MG capsule Take 30 mg by mouth daily. Take along with 60 mg capsule=90 mg 03/24/17   [provider]  DULoxetine (CYMBALTA) 60 MG capsule Take 60 mg by mouth daily. Take along with 34m capsule to make a total of 923m   [provider]  FORTEO 600 MCG/2.4ML SOLN Inject 20 mcg into the skin daily.  03/26/17   [provider]  furosemide (LASIX) 80 MG tablet Take 80 mg by mouth.    [provider]  gabapentin (NEURONTIN) 600 MG tablet Take 600-1,200 mg by mouth 2 (two) times daily. 1 tablet in morning and 2 tablets at night    [provider]  HYDROcodone-acetaminophen (NORCO/VICODIN) 5-325 MG tablet Take 1-2 tablets by mouth every 4 (four) hours as needed for moderate  pain or severe pain. 05/04/17   Roxan Hockey, MD  lactobacillus acidophilus & bulgar (LACTINEX) chewable tablet Chew 1 tablet by mouth 3 (three) times daily with meals. 05/04/17   Roxan Hockey, MD  losartan (COZAAR) 100 MG tablet Take 100 mg by mouth daily.  03/09/17   [provider]  Magnesium 250 MG TABS Take 1 tablet by mouth every evening.     [provider]  methocarbamol (ROBAXIN) 500 MG tablet Take 1 tablet (500 mg total) by mouth every 8 (eight) hours as needed for muscle spasms. 04/22/17   Dessa Phi, DO  metoprolol succinate (TOPROL-XL) 50 MG 24 hr tablet Take 1 tablet (50 mg total) by mouth daily. Take with or immediately following a meal. 04/22/17   Dessa Phi, DO  Multiple Vitamin (MULTIVITAMIN WITH MINERALS) TABS Take 1 tablet by mouth daily.    [provider]  mupirocin ointment (BACTROBAN) 2 % Apply 1 application topically 2 (two) times daily. Apply to the affected area 2 times a day Patient not taking: Reported on 04/29/2017 01/29/16   Newt Minion, MD  omeprazole (PRILOSEC) 20 MG capsule Take 20 mg by mouth every evening. 11/08/15   [provider]    ondansetron (ZOFRAN) 4 MG tablet Take 1 tablet (4 mg total) by mouth every 6 (six) hours as needed for nausea. 05/04/17   Roxan Hockey, MD  potassium chloride SA (K-DUR,KLOR-CON) 20 MEQ tablet Take 20 mEq by mouth 2 (two) times daily.     [provider]  predniSONE (DELTASONE) 5 MG tablet Take 10 mg daily x 5 days, then 5 mg daily 05/04/17   Roxan Hockey, MD  PROAIR HFA 108 (90 BASE) MCG/ACT inhaler Inhale 1-2 puffs into the lungs every 6 (six) hours as needed for wheezing or shortness of breath.  02/06/13   [provider]  silver sulfADIAZINE (SILVADENE) 1 % cream Apply 1 application topically daily. Patient not taking: Reported on 04/29/2017 11/11/16   Suzan Slick, NP  vitamin C (ASCORBIC ACID) 500 MG tablet Take 500 mg by mouth 2 (two) times daily.    [provider]  Vitamin D, Ergocalciferol, (DRISDOL) 50000 UNITS CAPS Take 50,000 Units by mouth every Tuesday.     [provider]  zinc sulfate 220 (50 Zn) MG capsule Take 220 mg by mouth daily.    [provider]    Family History Family History  Problem Relation Age of Onset  . Heart attack Father        1951-deceased   . Liver disease Mother   . Aneurysm Son 3       Deceased     Social History Social History   Tobacco Use  . Smoking status: Former Smoker    Packs/day: 0.50    Years: 30.00    Pack years: 15.00    Types: Cigarettes    Last attempt to quit: 08/09/1981    Years since quitting: 35.8  . Smokeless tobacco: Never Used  Substance Use Topics  . Alcohol use: No  . Drug use: No     Allergies   Penicillins; Prolia [denosumab]; and Fosamax [alendronate sodium]   Review of Systems Review of Systems  Constitutional: Positive for fever.  All other systems reviewed and are negative.    Physical Exam Updated Vital Signs BP (!) 130/99   Pulse (!) 112   Temp 100 F (37.8 C)   Resp 16   Ht _0  (1.499 m)   Wt 69.4 kg (  153 lb)   SpO2 99%   BMI 30.90  kg/m   Physical Exam  Constitutional: She is oriented to person, place, and time. She appears well-developed and well-nourished.  HENT:  Head: Normocephalic and atraumatic.  Mouth/Throat: Oropharynx is clear and moist.  Eyes: Pupils are equal, round, and reactive to light. Conjunctivae and EOM are normal.  Right periorbital erythema, conjunctiva injected  Neck: Normal range of motion.  Cardiovascular: Normal rate, regular rhythm and normal heart sounds.  Pulmonary/Chest: Effort normal and breath sounds normal.  Abdominal: Soft. Bowel sounds are normal.  Musculoskeletal: Normal range of motion.  PICC line right upper arm appears clean, no redness, swelling, drainage, or crusting  Neurological: She is alert and oriented to person, place, and time.  Skin: Skin is warm and dry. Rash noted.  Scattered patches of psoriasis including on face  Psychiatric: She has a normal mood and affect.  Nursing note and vitals reviewed.    ED Treatments / Results  Labs (all labs ordered are listed, but only abnormal results are displayed) Labs Reviewed  COMPREHENSIVE METABOLIC PANEL - Abnormal; Notable for the following components:      Result Value   Potassium 3.2 (*)    CO2 19 (*)    BUN 5 (*)    Calcium 8.7 (*)    Total Protein 6.0 (*)    Albumin 2.6 (*)    AST 14 (*)    ALT 11 (*)    Alkaline Phosphatase 147 (*)    All other components within normal limits  CBC WITH DIFFERENTIAL/PLATELET - Abnormal; Notable for the following components:   WBC 1.9 (*)    RBC 3.81 (*)    Hemoglobin 10.3 (*)    HCT 32.3 (*)    RDW 16.0 (*)    Platelets 541 (*)    Neutro Abs 0.0 (*)    All other components within normal limits  URINALYSIS, ROUTINE W REFLEX MICROSCOPIC - Abnormal; Notable for the following components:   APPearance CLOUDY (*)    Ketones, ur 20 (*)    Protein, ur 30 (*)    Bacteria, UA RARE (*)    All other components within normal limits  CULTURE, BLOOD (ROUTINE X 2)  CULTURE, BLOOD  (ROUTINE X 2)  URINE CULTURE  PROTIME-INR  I-STAT CG4 LACTIC ACID, ED    EKG EKG Interpretation  Date/Time:  Monday Jun 06 2017 22:49:09 EDT Ventricular Rate:  139 PR Interval:    QRS Duration: 94 QT Interval:  268 QTC Calculation: 408 R Axis:   12 Text Interpretation:  Atrial fibrillation Nonspecific repol abnormality, diffuse leads Confirmed by Thayer Jew [phone]) on 06/07/2017 1:29:24 AM   Radiology Dg Chest 2 View  Result Date: 06/06/2017 CLINICAL DATA:  77 y/o F; 1 week of back pain and confusion with fever. EXAM: CHEST - 2 VIEW COMPARISON:  05/04/2017 chest radiograph FINDINGS: Stable normal cardiac silhouette given projection and technique. Aortic atherosclerosis with calcification. Bronchitic changes in the left lung base. No focal consolidation. No pleural effusion or pneumothorax. 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 Right upper quadrant cholecystectomy clips. IMPRESSION: 1. Bronchitic changes in the left lung base. No focal consolidation. 2. Aortic atherosclerosis. Electronically Signed   By: Kristine Garbe M.D.   On: 06/06/2017 23:36    Procedures Procedures (including critical care time)  CRITICAL CARE Performed by: Larene Pickett   Total critical care time: 35 minutes  Critical care  time was exclusive of separately billable procedures and treating other patients.  Critical care was necessary to treat or prevent imminent or life-threatening deterioration.  Critical care was time spent personally by me on the following activities: development of treatment plan with patient and/or surrogate as well as nursing, discussions with consultants, evaluation of patient's response to treatment, examination of patient, obtaining history from patient or surrogate, ordering and performing treatments and interventions, ordering and review of laboratory studies, ordering and  review of radiographic studies, pulse oximetry and re-evaluation of patient's condition.   Medications Ordered in ED Medications - No data to display   Initial Impression / Assessment and Plan / ED Course  I have reviewed the triage vital signs and the nursing notes.  Pertinent labs & imaging results that were available during my care of the patient were reviewed by me and considered in my medical decision making (see chart for details).  77 year old female here with back pain and fever.  Has known T10-T11 discitis and osteomyelitis as well as group B strep bacteremia, recently on Rocephin but now vancomycin via PICC line in right upper arm.  PICC line does not appear infected.  Has been followed by Dr. Linus Salmons with ID.  Temp here 100F.  Reports continued back pain, denies change in pain.  No new leg numbness/weakness or bowel/bladder incontinence.  Has had some dysuria, hx of recurrent UTI. Heart rate is elevated, may be somewhat due to fever.  She also admits her dose of metoprolol today.  We will give some IV fluids, Tylenol for fever.  Labs, chest x-ray, UA pending.  Labs with WBC count 1.9, ANC is 0.  Seems this is been the case recently with lab draws at outside facility, these are not viewable, however there have been several notes and discussions in chart between pharmacy and infectious disease about potential abx change and monitoring of ANC.  CXR clear.  UA with rare bacteria.  Blood and urine cultures have been sent.  Given that patient has not had any significant improvement despite a few weeks of IV abx and now recurrent fever, will discuss with ID for recommendations and admit to hospital for ongoing care.  1:45 AM Discussed with ID, Dr. Johnnye Sima-- recommends to start IV Daptomycin and Cipro.  He will consult in the morning and follow along.  Repeat blood/urine cultures have been sent.    Discussed with Dr. Hal Hope-- he will admit for ongoing care.  Will go ahead and repeat MRI w/  and w/out contrast to ensure no new changes/development of abscess.    Final Clinical Impressions(s) / ED Diagnoses   Final diagnoses:  Neutropenic fever ([place])  Discitis thoracic region  Osteomyelitis of thoracic region [facility])    ED Discharge Orders    None       Larene Pickett, PA-C 06/07/17 2951    Merryl Hacker, MD 06/09/17 [phone]

## 2017-06-06 NOTE — ED Notes (Signed)
Patient transported to X-ray 

## 2017-06-06 NOTE — ED Triage Notes (Signed)
Pt from home for back pain x 1 week with confusion at times. Family reported to EMS that pt has had an ongoing UTI since March. Pt warm to touch, R eye redness noted, also reports missing her dose of Metoprolol. PICC line noted to R arm

## 2017-06-07 ENCOUNTER — Other Ambulatory Visit: Payer: Self-pay

## 2017-06-07 ENCOUNTER — Inpatient Hospital Stay (HOSPITAL_COMMUNITY): Payer: Medicare Other

## 2017-06-07 ENCOUNTER — Encounter (HOSPITAL_COMMUNITY): Payer: Self-pay | Admitting: Internal Medicine

## 2017-06-07 DIAGNOSIS — R5081 Fever presenting with conditions classified elsewhere: Secondary | ICD-10-CM

## 2017-06-07 DIAGNOSIS — Y92129 Unspecified place in nursing home as the place of occurrence of the external cause: Secondary | ICD-10-CM | POA: Diagnosis not present

## 2017-06-07 DIAGNOSIS — Z981 Arthrodesis status: Secondary | ICD-10-CM | POA: Diagnosis not present

## 2017-06-07 DIAGNOSIS — M4624 Osteomyelitis of vertebra, thoracic region: Secondary | ICD-10-CM

## 2017-06-07 DIAGNOSIS — T368X5A Adverse effect of other systemic antibiotics, initial encounter: Secondary | ICD-10-CM | POA: Diagnosis present

## 2017-06-07 DIAGNOSIS — I4891 Unspecified atrial fibrillation: Secondary | ICD-10-CM

## 2017-06-07 DIAGNOSIS — K219 Gastro-esophageal reflux disease without esophagitis: Secondary | ICD-10-CM | POA: Diagnosis present

## 2017-06-07 DIAGNOSIS — Z87440 Personal history of urinary (tract) infections: Secondary | ICD-10-CM | POA: Diagnosis not present

## 2017-06-07 DIAGNOSIS — Z8249 Family history of ischemic heart disease and other diseases of the circulatory system: Secondary | ICD-10-CM | POA: Diagnosis not present

## 2017-06-07 DIAGNOSIS — L02212 Cutaneous abscess of back [any part, except buttock]: Secondary | ICD-10-CM | POA: Diagnosis present

## 2017-06-07 DIAGNOSIS — I10 Essential (primary) hypertension: Secondary | ICD-10-CM | POA: Diagnosis present

## 2017-06-07 DIAGNOSIS — F40240 Claustrophobia: Secondary | ICD-10-CM | POA: Diagnosis present

## 2017-06-07 DIAGNOSIS — M4644 Discitis, unspecified, thoracic region: Secondary | ICD-10-CM | POA: Diagnosis present

## 2017-06-07 DIAGNOSIS — Z95828 Presence of other vascular implants and grafts: Secondary | ICD-10-CM

## 2017-06-07 DIAGNOSIS — L4050 Arthropathic psoriasis, unspecified: Secondary | ICD-10-CM | POA: Diagnosis present

## 2017-06-07 DIAGNOSIS — R7881 Bacteremia: Secondary | ICD-10-CM

## 2017-06-07 DIAGNOSIS — N739 Female pelvic inflammatory disease, unspecified: Secondary | ICD-10-CM

## 2017-06-07 DIAGNOSIS — B951 Streptococcus, group B, as the cause of diseases classified elsewhere: Secondary | ICD-10-CM | POA: Diagnosis present

## 2017-06-07 DIAGNOSIS — G9341 Metabolic encephalopathy: Secondary | ICD-10-CM | POA: Diagnosis present

## 2017-06-07 DIAGNOSIS — R6510 Systemic inflammatory response syndrome (SIRS) of non-infectious origin without acute organ dysfunction: Secondary | ICD-10-CM

## 2017-06-07 DIAGNOSIS — R296 Repeated falls: Secondary | ICD-10-CM | POA: Diagnosis present

## 2017-06-07 DIAGNOSIS — L89151 Pressure ulcer of sacral region, stage 1: Secondary | ICD-10-CM | POA: Diagnosis present

## 2017-06-07 DIAGNOSIS — G894 Chronic pain syndrome: Secondary | ICD-10-CM | POA: Diagnosis present

## 2017-06-07 DIAGNOSIS — T361X5A Adverse effect of cephalosporins and other beta-lactam antibiotics, initial encounter: Secondary | ICD-10-CM

## 2017-06-07 DIAGNOSIS — D702 Other drug-induced agranulocytosis: Secondary | ICD-10-CM | POA: Diagnosis present

## 2017-06-07 DIAGNOSIS — L89614 Pressure ulcer of right heel, stage 4: Secondary | ICD-10-CM

## 2017-06-07 DIAGNOSIS — I480 Paroxysmal atrial fibrillation: Secondary | ICD-10-CM | POA: Diagnosis present

## 2017-06-07 DIAGNOSIS — D709 Neutropenia, unspecified: Secondary | ICD-10-CM

## 2017-06-07 DIAGNOSIS — M86151 Other acute osteomyelitis, right femur: Secondary | ICD-10-CM | POA: Diagnosis not present

## 2017-06-07 DIAGNOSIS — T50905A Adverse effect of unspecified drugs, medicaments and biological substances, initial encounter: Secondary | ICD-10-CM | POA: Diagnosis not present

## 2017-06-07 DIAGNOSIS — Z87891 Personal history of nicotine dependence: Secondary | ICD-10-CM | POA: Diagnosis not present

## 2017-06-07 DIAGNOSIS — D638 Anemia in other chronic diseases classified elsewhere: Secondary | ICD-10-CM | POA: Diagnosis present

## 2017-06-07 DIAGNOSIS — L89613 Pressure ulcer of right heel, stage 3: Secondary | ICD-10-CM | POA: Diagnosis present

## 2017-06-07 LAB — CBC WITH DIFFERENTIAL/PLATELET
BAND NEUTROPHILS: 0 %
BASOS ABS: 0 10*3/uL (ref 0.0–0.1)
BASOS PCT: 0 %
BLASTS: 0 %
EOS ABS: 0.1 10*3/uL (ref 0.0–0.7)
EOS PCT: 3 %
HCT: 27.6 % — ABNORMAL LOW (ref 36.0–46.0)
Hemoglobin: 8.9 g/dL — ABNORMAL LOW (ref 12.0–15.0)
LYMPHS ABS: 1.9 10*3/uL (ref 0.7–4.0)
LYMPHS PCT: 70 %
MCH: 27.1 pg (ref 26.0–34.0)
MCHC: 32.2 g/dL (ref 30.0–36.0)
MCV: 83.9 fL (ref 78.0–100.0)
METAMYELOCYTES PCT: 0 %
MONOS PCT: 24 %
Monocytes Absolute: 0.7 10*3/uL (ref 0.1–1.0)
Myelocytes: 0 %
Neutro Abs: 0.1 10*3/uL — ABNORMAL LOW (ref 1.7–7.7)
Neutrophils Relative %: 3 %
OTHER: 0 %
PLATELETS: 528 10*3/uL — AB (ref 150–400)
Promyelocytes Relative: 0 %
RBC: 3.29 MIL/uL — ABNORMAL LOW (ref 3.87–5.11)
RDW: 16.1 % — AB (ref 11.5–15.5)
WBC: 2.8 10*3/uL — ABNORMAL LOW (ref 4.0–10.5)
nRBC: 0 /100 WBC

## 2017-06-07 LAB — COMPREHENSIVE METABOLIC PANEL
ALBUMIN: 2.3 g/dL — AB (ref 3.5–5.0)
ALT: 9 U/L — ABNORMAL LOW (ref 14–54)
ANION GAP: 12 (ref 5–15)
AST: 11 U/L — AB (ref 15–41)
Alkaline Phosphatase: 131 U/L — ABNORMAL HIGH (ref 38–126)
BILIRUBIN TOTAL: 0.8 mg/dL (ref 0.3–1.2)
BUN: 5 mg/dL — AB (ref 6–20)
CHLORIDE: 109 mmol/L (ref 101–111)
CO2: 19 mmol/L — AB (ref 22–32)
Calcium: 8.4 mg/dL — ABNORMAL LOW (ref 8.9–10.3)
Creatinine, Ser: 0.54 mg/dL (ref 0.44–1.00)
GFR calc Af Amer: >60 mL/min (ref >60)
GFR calc non Af Amer: >60 mL/min (ref >60)
Glucose, Bld: 73 mg/dL (ref 65–99)
POTASSIUM: 3 mmol/L — AB (ref 3.5–5.1)
SODIUM: 140 mmol/L (ref 135–145)
TOTAL PROTEIN: 5.3 g/dL — AB (ref 6.5–8.1)

## 2017-06-07 LAB — PATHOLOGIST SMEAR REVIEW

## 2017-06-07 LAB — TSH: TSH: 1.339 u[IU]/mL (ref 0.350–4.500)

## 2017-06-07 LAB — URINALYSIS, ROUTINE W REFLEX MICROSCOPIC
BILIRUBIN URINE: NEGATIVE
GLUCOSE, UA: NEGATIVE mg/dL
HGB URINE DIPSTICK: NEGATIVE
Ketones, ur: 20 mg/dL — AB
LEUKOCYTES UA: NEGATIVE
Nitrite: NEGATIVE
Protein, ur: 30 mg/dL — AB
SPECIFIC GRAVITY, URINE: 1.018 (ref 1.005–1.030)
pH: 6 (ref 5.0–8.0)

## 2017-06-07 LAB — MRSA PCR SCREENING: MRSA by PCR: POSITIVE — AB

## 2017-06-07 MED ORDER — ALBUTEROL SULFATE (2.5 MG/3ML) 0.083% IN NEBU: 2.5000 mg | INHALATION_SOLUTION | Freq: Four times a day (QID) | RESPIRATORY_TRACT | Status: DC | PRN

## 2017-06-07 MED ORDER — DULOXETINE HCL 30 MG PO CPEP
90.0000 mg | ORAL_CAPSULE | Freq: Every day | ORAL | Status: DC
  Administered 2017-06-07 – 2017-06-14 (×8): 90 mg via ORAL
  Filled 2017-06-07 (×8): qty 1

## 2017-06-07 MED ORDER — TERIPARATIDE (RECOMBINANT) 600 MCG/2.4ML ~~LOC~~ SOLN: 20.0000 ug | Freq: Every day | SUBCUTANEOUS | Status: DC

## 2017-06-07 MED ORDER — ACETAMINOPHEN 325 MG PO TABS: 650.0000 mg | ORAL_TABLET | Freq: Four times a day (QID) | ORAL | Status: DC | PRN

## 2017-06-07 MED ORDER — METOPROLOL SUCCINATE ER 50 MG PO TB24
50.0000 mg | ORAL_TABLET | Freq: Every day | ORAL | Status: DC
  Administered 2017-06-07 – 2017-06-14 (×8): 50 mg via ORAL
  Filled 2017-06-07 (×8): qty 1

## 2017-06-07 MED ORDER — SODIUM CHLORIDE 0.9 % IV SOLN: 420.0000 mg | INTRAVENOUS | Status: DC

## 2017-06-07 MED ORDER — ACETAMINOPHEN 650 MG RE SUPP: 650.0000 mg | Freq: Four times a day (QID) | RECTAL | Status: DC | PRN

## 2017-06-07 MED ORDER — METHOCARBAMOL 500 MG PO TABS
500.0000 mg | ORAL_TABLET | Freq: Three times a day (TID) | ORAL | Status: DC | PRN
  Administered 2017-06-09 – 2017-06-12 (×2): 500 mg via ORAL
  Filled 2017-06-07 (×2): qty 1

## 2017-06-07 MED ORDER — SODIUM CHLORIDE 0.9 % IV SOLN
420.0000 mg | INTRAVENOUS | Status: DC
  Administered 2017-06-07 – 2017-06-13 (×7): 420 mg via INTRAVENOUS
  Filled 2017-06-07 (×7): qty 8.4

## 2017-06-07 MED ORDER — GABAPENTIN 600 MG PO TABS
600.0000 mg | ORAL_TABLET | Freq: Every day | ORAL | Status: DC
  Administered 2017-06-07 – 2017-06-14 (×8): 600 mg via ORAL
  Filled 2017-06-07 (×8): qty 1

## 2017-06-07 MED ORDER — PANTOPRAZOLE SODIUM 40 MG PO TBEC
40.0000 mg | DELAYED_RELEASE_TABLET | Freq: Every day | ORAL | Status: DC
  Administered 2017-06-07 – 2017-06-14 (×8): 40 mg via ORAL
  Filled 2017-06-07 (×8): qty 1

## 2017-06-07 MED ORDER — POTASSIUM CHLORIDE CRYS ER 20 MEQ PO TBCR
20.0000 meq | EXTENDED_RELEASE_TABLET | Freq: Two times a day (BID) | ORAL | Status: DC
  Administered 2017-06-07 – 2017-06-14 (×15): 20 meq via ORAL
  Filled 2017-06-07 (×15): qty 1

## 2017-06-07 MED ORDER — HYDROCORTISONE NA SUCCINATE PF 100 MG IJ SOLR
50.0000 mg | Freq: Three times a day (TID) | INTRAMUSCULAR | Status: DC
  Administered 2017-06-07 – 2017-06-09 (×6): 50 mg via INTRAVENOUS
  Filled 2017-06-07 (×7): qty 2

## 2017-06-07 MED ORDER — ENOXAPARIN SODIUM 40 MG/0.4ML ~~LOC~~ SOLN
40.0000 mg | Freq: Every day | SUBCUTANEOUS | Status: DC
  Administered 2017-06-07 – 2017-06-14 (×8): 40 mg via SUBCUTANEOUS
  Filled 2017-06-07 (×8): qty 0.4

## 2017-06-07 MED ORDER — LOSARTAN POTASSIUM 50 MG PO TABS
100.0000 mg | ORAL_TABLET | Freq: Every day | ORAL | Status: DC
  Administered 2017-06-07 – 2017-06-14 (×8): 100 mg via ORAL
  Filled 2017-06-07 (×8): qty 2

## 2017-06-07 MED ORDER — CIPROFLOXACIN IN D5W 400 MG/200ML IV SOLN
400.0000 mg | Freq: Two times a day (BID) | INTRAVENOUS | Status: DC
  Administered 2017-06-07 – 2017-06-14 (×14): 400 mg via INTRAVENOUS
  Filled 2017-06-07 (×13): qty 200

## 2017-06-07 MED ORDER — GABAPENTIN 600 MG PO TABS
600.0000 mg | ORAL_TABLET | Freq: Every day | ORAL | Status: DC
  Administered 2017-06-07 – 2017-06-13 (×7): 600 mg via ORAL
  Filled 2017-06-07 (×7): qty 1

## 2017-06-07 MED ORDER — DILTIAZEM HCL ER COATED BEADS 180 MG PO CP24
180.0000 mg | ORAL_CAPSULE | Freq: Every day | ORAL | Status: DC
  Administered 2017-06-07 – 2017-06-13 (×8): 180 mg via ORAL
  Filled 2017-06-07 (×9): qty 1

## 2017-06-07 MED ORDER — HYDROCODONE-ACETAMINOPHEN 5-325 MG PO TABS
1.0000 | ORAL_TABLET | ORAL | Status: DC | PRN
  Filled 2017-06-07: qty 2

## 2017-06-07 MED ORDER — PRO-STAT SUGAR FREE PO LIQD
30.0000 mL | Freq: Two times a day (BID) | ORAL | Status: DC
  Administered 2017-06-07 – 2017-06-14 (×15): 30 mL via ORAL
  Filled 2017-06-07 (×15): qty 30

## 2017-06-07 MED ORDER — LORAZEPAM 2 MG/ML IJ SOLN: 1.0000 mg | Freq: Once | INTRAMUSCULAR | Status: DC | PRN

## 2017-06-07 MED ORDER — FUROSEMIDE 20 MG PO TABS
80.0000 mg | ORAL_TABLET | Freq: Every day | ORAL | Status: DC
  Administered 2017-06-07 – 2017-06-14 (×8): 80 mg via ORAL
  Filled 2017-06-07 (×10): qty 4

## 2017-06-07 MED ORDER — ADULT MULTIVITAMIN W/MINERALS CH
1.0000 | ORAL_TABLET | Freq: Every day | ORAL | Status: DC
  Administered 2017-06-07 – 2017-06-14 (×8): 1 via ORAL
  Filled 2017-06-07 (×8): qty 1

## 2017-06-07 MED ORDER — ONDANSETRON HCL 4 MG/2ML IJ SOLN: 4.0000 mg | Freq: Four times a day (QID) | INTRAMUSCULAR | Status: DC | PRN

## 2017-06-07 MED ORDER — POTASSIUM CHLORIDE CRYS ER 20 MEQ PO TBCR
40.0000 meq | EXTENDED_RELEASE_TABLET | Freq: Once | ORAL | Status: AC
  Administered 2017-06-07: 40 meq via ORAL
  Filled 2017-06-07: qty 2

## 2017-06-07 MED ORDER — MUPIROCIN 2 % EX OINT
1.0000 "application " | TOPICAL_OINTMENT | Freq: Two times a day (BID) | CUTANEOUS | Status: AC
  Administered 2017-06-07 – 2017-06-11 (×10): 1 via NASAL
  Filled 2017-06-07 (×3): qty 22

## 2017-06-07 MED ORDER — ONDANSETRON HCL 4 MG PO TABS: 4.0000 mg | ORAL_TABLET | Freq: Four times a day (QID) | ORAL | Status: DC | PRN

## 2017-06-07 MED ORDER — SODIUM CHLORIDE 0.9% FLUSH
10.0000 mL | INTRAVENOUS | Status: DC | PRN
  Administered 2017-06-09: 20 mL
  Filled 2017-06-07: qty 40

## 2017-06-07 MED ORDER — CHLORHEXIDINE GLUCONATE CLOTH 2 % EX PADS
6.0000 | MEDICATED_PAD | Freq: Every day | CUTANEOUS | Status: AC
  Administered 2017-06-07 – 2017-06-11 (×5): 6 via TOPICAL

## 2017-06-07 MED ORDER — POLYVINYL ALCOHOL 1.4 % OP SOLN: 1.0000 [drp] | Freq: Three times a day (TID) | OPHTHALMIC | Status: DC | PRN

## 2017-06-07 MED ORDER — GABAPENTIN 600 MG PO TABS: 600.0000 mg | ORAL_TABLET | Freq: Two times a day (BID) | ORAL | Status: DC

## 2017-06-07 MED ORDER — CIPROFLOXACIN IN D5W 400 MG/200ML IV SOLN
400.0000 mg | Freq: Once | INTRAVENOUS | Status: AC
  Administered 2017-06-07: 400 mg via INTRAVENOUS
  Filled 2017-06-07: qty 200

## 2017-06-07 MED ORDER — VITAMIN C 500 MG PO TABS
500.0000 mg | ORAL_TABLET | Freq: Two times a day (BID) | ORAL | Status: DC
  Administered 2017-06-07 – 2017-06-14 (×15): 500 mg via ORAL
  Filled 2017-06-07 (×15): qty 1

## 2017-06-07 MED ORDER — MAGNESIUM OXIDE 400 (241.3 MG) MG PO TABS
400.0000 mg | ORAL_TABLET | Freq: Every evening | ORAL | Status: DC
Start: 2017-06-07 — End: 2017-06-14
  Administered 2017-06-07 – 2017-06-13 (×7): 400 mg via ORAL
  Filled 2017-06-07 (×7): qty 1

## 2017-06-07 MED ORDER — ASPIRIN EC 81 MG PO TBEC
81.0000 mg | DELAYED_RELEASE_TABLET | Freq: Every evening | ORAL | Status: DC
  Administered 2017-06-07 – 2017-06-13 (×7): 81 mg via ORAL
  Filled 2017-06-07 (×7): qty 1

## 2017-06-07 NOTE — Progress Notes (Signed)
Unable to complete nursing history. Data obtained unrialable . Pt confused at times.

## 2017-06-07 NOTE — Care Management Note (Signed)
Case Management Note  Patient Details  Name: Andrea Yu MRN: [id number] Date of Birth: 10-26-1940  Subjective/Objective:                 Was at [facility] then home a few days prior to coming back in. Came in w PICC, was getting treated for GBS bacteremia w Cipro(?). Active w [facility]   Action/Plan:   Expected Discharge Date:                  Expected Discharge Plan:     In-House Referral:     Discharge planning Services  CM Consult  Post Acute Care Choice:    Choice offered to:     DME Arranged:    DME Agency:     HH Arranged:    HH Agency:     Status of Service:  In process, will continue to follow  If discussed at Long Length of Stay Meetings, dates discussed:    Additional Comments:  Lawerance Sabal, RN 06/07/2017, 9:39 AM

## 2017-06-07 NOTE — Progress Notes (Signed)
Visited with this patient who requested prayer.  She wanted prayer for her overall healing.  Prayed for her and with her and will continue to follow her as needed.    06/07/17 1205  Clinical Encounter Type  Visited With Patient  Visit Type Initial;Spiritual support  Spiritual Encounters  Spiritual Needs Prayer

## 2017-06-07 NOTE — H&P (Signed)
History and Physical    Andrea Yu ACZ:[id number] DOB: 1940-09-26 DOA: 06/06/2017  PCP: Leanna Battles, MD  Patient coming from: Home.  Chief Complaint: Fever and chills.  HPI: Andrea Yu is a 77 y.o. 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  On March 10 4 days ago patient was found to have dropping neutrophil count and antibiotics were changed to vancomycin.  Over the last 24 hours patient started developing fever and chills and increasing back pain.  Denies any incontinence of urine bowel.  Denies any chest pain shortness of breath productive cough nausea vomiting or diarrhea.  ED Course: In the ER patient was febrile.  Absolute neutrophil count is almost 0.  ER physician discussed with on-call infectious disease consultant Dr. Johnnye Sima who at this time advised to get blood cultures and place patient on daptomycin and Cipro.  Due to worsening pain MRI of the spine has been ordered.  Patient also has an ulceration on the right foot which is not actively draining.  Also had a fall last week hit her head.  Did not lose consciousness.  Mild erythema of the right eye which patient attributes to scratching the eye.  No discharge.  Review of Systems: As per HPI, rest all negative.   Past Medical History:  Diagnosis Date  . A-fib ([place])    "just after knee 2nd OR" (11/07/2012)  . Anemia   . Arthritis    RA , SACROTIC ARTHRITIS  . Complication of anesthesia    found to be in afib when she woke up  . Dysrhythmia   . GERD (gastroesophageal reflux disease)   . Head injury, closed, with concussion   . History of blood transfusion    "after 1 of my knee ORs and today"  (11/07/2012)  . Hypertension   . Peripheral neuropathy    "from back OR in 2005; in hands and feet" (11/07/2012)  . Psoriatic arthritis ([place])    on Prednisone Rx    Past Surgical History:  Procedure Laterality Date  . ANKLE FUSION Right 01/07/2016   Procedure: Right Tibiocalcaneal Fusion;  Surgeon: Newt Minion, MD;  Location: [place];  Service: Orthopedics;  Laterality: Right;  . BACK SURGERY    . CATARACT EXTRACTION W/ INTRAOCULAR LENS  IMPLANT, BILATERAL Bilateral 2007  . CHOLECYSTECTOMY  2000's  . COLONOSCOPY N/A 11/10/2012   Procedure: COLONOSCOPY;  Surgeon: Beryle Beams, MD;  Location: [place];  Service: Endoscopy;  Laterality: N/A;  . ESOPHAGOGASTRODUODENOSCOPY N/A 11/10/2012   Procedure: ESOPHAGOGASTRODUODENOSCOPY (EGD);  Surgeon: Beryle Beams, MD;  Location: [facility] ENDOSCOPY;  Service: Endoscopy;  Laterality: N/A;  . EYE SURGERY    . JOINT REPLACEMENT    . POSTERIOR LUMBAR FUSION  2005   "got a rod and 4 pins in there" (11/07/2012)  . REPLACEMENT TOTAL KNEE BILATERAL Bilateral 2007  . TRANSTHORACIC ECHOCARDIOGRAM  05/24/2006  . TUBAL LIGATION  1974  . VAGINAL HYSTERECTOMY  1983     reports that she quit smoking about 35 years ago. Her smoking use included cigarettes. She has a 15.00 pack-year smoking history. She has never used smokeless tobacco. She reports that she does not drink alcohol or use drugs.  Allergies  Allergen Reactions  . Penicillins Anaphylaxis    THROAT SWELLING  Has patient had a PCN reaction causing immediate rash, facial/tongue/throat swelling, SOB or lightheadedness with hypotension:  # # YES # #  Has patient had a PCN reaction causing severe rash involving mucus membranes or skin necrosis: # # # YES # # # Has patient had a PCN reaction that required hospitalization:No Has patient had a PCN reaction occurring within the last 10 years:  # # YES # #  If all of the above answers are "NO", then may proceed with Cephalosporin use.   Burns [place]  [Denosumab] Other (See Comments)    Severe Pain in the groin area.  Marland Kitchen Fosamax [Alendronate Sodium]     UNSPECIFIED REACTION     Family History  Problem Relation Age of Onset  . Heart attack Father        1951-deceased   . Liver disease Mother   . Aneurysm Son 26       Deceased     Prior to Admission medications   Medication Sig Start Date End Date Taking? Authorizing Provider  acetaminophen (TYLENOL) 325 MG tablet Take 2 tablets (650 mg total) by mouth every 4 (four) hours as needed. Patient taking differently: Take 325 mg by mouth every 6 (six) hours as needed (for pain.).  11/11/12  Yes Delfina Redwood, MD  acetaminophen (TYLENOL) 325 MG tablet Take 2 tablets (650 mg total) by mouth every 6 (six) hours as needed for mild pain (or Fever >/= 101). 05/04/17  Yes Emokpae, Courage, MD  Apremilast (OTEZLA) 30 MG TABS Take 30 mg by mouth 2 (two) times daily.   Yes [provider]  aspirin EC 81 MG tablet Take 81 mg by mouth every evening.   Yes [provider]  cefTRIAXone (ROCEPHIN) IVPB Inject 2 g into the vein daily. Indication:  Discitis Last Day of Therapy:  06/23/17 Labs - Once weekly:  CBC/D and BMP, Labs - Every other week:  ESR and CRP 05/04/17 06/24/17 Yes Emokpae, Courage, MD  DILT-XR 180 MG 24 hr capsule Take 180 mg by mouth at bedtime. 04/07/17  Yes [provider]  DULoxetine (CYMBALTA) 30 MG capsule Take 90 mg by mouth daily. Take along with 60 mg capsule=90 mg 03/24/17  Yes [provider]  DULoxetine (CYMBALTA) 60 MG capsule Take 90 mg by mouth daily. Take along with 51m capsule to make a total of 958m   Yes [provider]  FORTEO 600 MCG/2.4ML SOLN Inject 20 mcg into the skin daily.  03/26/17  Yes [provider]  furosemide (LASIX) 80 MG tablet Take 80 mg by mouth.   Yes [provider]  gabapentin (NEURONTIN) 600 MG tablet Take 600-1,200 mg by mouth 2 (two) times daily. 1 tablet in morning and 2 tablets at  night   Yes [provider]  HYDROcodone-acetaminophen (NORCO/VICODIN) 5-325 MG tablet Take 1-2 tablets by mouth every 4 (four) hours as needed for moderate pain or severe pain. 05/04/17  Yes Emokpae, Courage, MD  hydroxypropyl methylcellulose / hypromellose (ISOPTO TEARS / GONIOVISC) 2.5 % ophthalmic solution Place 1 drop into both eyes 3 (three) times daily as needed for dry eyes.   Yes [provider]  losartan (COZAAR) 100 MG tablet Take 100 mg by mouth daily.  03/09/17  Yes [provider]  Magnesium 250 MG TABS Take 1 tablet by mouth every evening.    Yes [provider]  methocarbamol (ROBAXIN) 500 MG tablet  Take 1 tablet (500 mg total) by mouth every 8 (eight) hours as needed for muscle spasms. 04/22/17  Yes Dessa Phi, DO  metoprolol succinate (TOPROL-XL) 50 MG 24 hr tablet Take 1 tablet (50 mg total) by mouth daily. Take with or immediately following a meal. 04/22/17  Yes Dessa Phi, DO  Multiple Vitamin (MULTIVITAMIN WITH MINERALS) TABS Take 1 tablet by mouth daily.   Yes [provider]  omeprazole (PRILOSEC) 20 MG capsule Take 20 mg by mouth every evening. 11/08/15  Yes [provider]  ondansetron (ZOFRAN) 4 MG tablet Take 1 tablet (4 mg total) by mouth every 6 (six) hours as needed for nausea. 05/04/17  Yes Emokpae, Courage, MD  potassium chloride SA (K-DUR,KLOR-CON) 20 MEQ tablet Take 20 mEq by mouth 2 (two) times daily.    Yes [provider]  predniSONE (DELTASONE) 5 MG tablet Take 10 mg daily x 5 days, then 5 mg daily Patient taking differently: Take 5 mg by mouth daily with breakfast.  05/04/17  Yes Emokpae, Courage, MD  PROAIR HFA 108 (90 BASE) MCG/ACT inhaler Inhale 1-2 puffs into the lungs every 6 (six) hours as needed for wheezing or shortness of breath.  02/06/13  Yes [provider]  vitamin C (ASCORBIC ACID) 500 MG tablet Take 500 mg by mouth 2 (two) times daily.   Yes [provider]  Vitamin  D, Ergocalciferol, (DRISDOL) 50000 UNITS CAPS Take 50,000 Units by mouth every Tuesday.    Yes [provider]    Physical Exam: Vitals:   06/07/17 0149 06/07/17 0200 06/07/17 0230 06/07/17 0245  BP:  (!) 154/72 (!) 142/65 124/60  Pulse:  (!) 101 (!) 113 (!) 108  Resp:  (!) _0 Temp: 98.7 F (37.1 C)     TempSrc: Oral     SpO2:  96% 97% 98%  Weight:      Height:          Constitutional: Moderately built and nourished. Vitals:   06/07/17 0149 06/07/17 0200 06/07/17 0230 06/07/17 0245  BP:  (!) 154/72 (!) 142/65 124/60  Pulse:  (!) 101 (!) 113 (!) 108  Resp:  (!) _1 Temp: 98.7 F (37.1 C)     TempSrc: Oral     SpO2:  96% 97% 98%  Weight:      Height:       Eyes: Anicteric no pallor.  Mild erythema around the right eyelid. ENMT: No discharge from the ears eyes nose or mouth. Neck: No mass felt.  No neck rigidity. Respiratory: No rhonchi or crepitations. Cardiovascular: S1-S2 heard no murmurs appreciated. Abdomen: Soft nontender bowel sounds present. Musculoskeletal: No edema.  No joint effusion. Skin: Ulceration on the right foot heel area.  Also sacral stage I decubitus ulcer. Neurologic: Alert awake oriented to time place and person.  Moves all extremities. Psychiatric: Appears normal.  Normal affect.   Labs on Admission: I have personally reviewed following labs and imaging studies  CBC: Recent Labs  Lab 06/06/17 2245  WBC 1.9*  NEUTROABS 0.0*  HGB 10.3*  HCT 32.3*  MCV 84.8  PLT 170*   Basic Metabolic Panel: Recent Labs  Lab 06/06/17 2245  NA 139  K 3.2*  CL 106  CO2 19*  GLUCOSE 91  BUN 5*  CREATININE 0.65  CALCIUM 8.7*   GFR: Estimated Creatinine Clearance: 49.1 mL/min (by C-G formula based on SCr of 0.65 mg/dL). Liver Function Tests: Recent Labs  Lab 06/06/17 2245  AST 14*  ALT 11*  ALKPHOS 147*  BILITOT 0.8  PROT 6.0*  ALBUMIN 2.6*   No results for input(s): LIPASE, AMYLASE in the last 168 hours. No results  for input(s): AMMONIA in the last 168 hours. Coagulation Profile: Recent Labs  Lab 06/06/17 2245  INR 1.19   Cardiac Enzymes: No results for input(s): CKTOTAL, CKMB, CKMBINDEX, TROPONINI in the last 168 hours. BNP (last 3 results) No results for input(s): PROBNP in the last 8760 hours. HbA1C: No results for input(s): HGBA1C in the last 72 hours. CBG: No results for input(s): GLUCAP in the last 168 hours. Lipid Profile: No results for input(s): CHOL, HDL, LDLCALC, TRIG, CHOLHDL, LDLDIRECT in the last 72 hours. Thyroid Function Tests: No results for input(s): TSH, T4TOTAL, FREET4, T3FREE, THYROIDAB in the last 72 hours. Anemia Panel: No results for input(s): VITAMINB12, FOLATE, FERRITIN, TIBC, IRON, RETICCTPCT in the last 72 hours. Urine analysis:    Component Value Date/Time   COLORURINE YELLOW 06/06/2017 2245   APPEARANCEUR CLOUDY (A) 06/06/2017 2245   LABSPEC 1.018 06/06/2017 2245   PHURINE 6.0 06/06/2017 2245   GLUCOSEU NEGATIVE 06/06/2017 2245   HGBUR NEGATIVE 06/06/2017 2245   BILIRUBINUR NEGATIVE 06/06/2017 2245   KETONESUR 20 (A) 06/06/2017 2245   PROTEINUR 30 (A) 06/06/2017 2245   UROBILINOGEN 0.2 09/14/2014 1637   NITRITE NEGATIVE 06/06/2017 2245   LEUKOCYTESUR NEGATIVE 06/06/2017 2245   Sepsis Labs: _0 (procalcitonin:4,lacticidven:4) )No results found for this or any previous visit (from the past 240 hour(s)).   Radiological Exams on Admission: Dg Chest 2 View  Result Date: 06/06/2017 CLINICAL DATA:  77 y/o F; 1 week of back pain and confusion with fever. EXAM: CHEST - 2 VIEW COMPARISON:  05/04/2017 chest radiograph FINDINGS: Stable normal cardiac silhouette given projection and technique. Aortic atherosclerosis with calcification. Bronchitic changes in the left lung base. No focal consolidation. No pleural effusion or pneumothorax. 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  Right upper quadrant cholecystectomy clips. IMPRESSION: 1. Bronchitic changes in the left lung base. No focal consolidation. 2. Aortic atherosclerosis. Electronically Signed   By: Kristine Garbe M.D.   On: 06/06/2017 23:36    EKG: Independently reviewed.  A. fib with RVR.  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 Johnnye Sima is advised patient to be placed on daptomycin and Cipro.  Pharmacy has been dosing.  MRI of the T-spine and L-spine, x-ray of the right foot and CT brain are pending.  Follow blood cultures.  Note that patient also has a PICC line on right upper extremity.  May have to remove it if blood cultures are positive.  Infectious disease to see patient in consult. 2. Febrile neutropenia -follow CBC with differential counts.  If continues to be neutropenic will need oncologist input. 3. A. fib with RVR -ordered patient's home dose of Cardizem now.  Patient is also on metoprolol.  If heart rate does not improve with oral medication we will have to start patient on Cardizem infusion.  Not on anticoagulation secondary risk of falls and bleed. 4. Hypertension continue Cardizem and metoprolol. 5. Sacral decubitus stage I ulcer and right foot ulceration.  Follow x-rays. 6. History of psoriatic arthritis on prednisone and apremilast.  Patient placed on stress dose hydrocortisone holding apremilast for now since patient appears to be getting septic. 7. Anemia of chronic disease follow CBC.   DVT prophylaxis: SCDs until we get the results of MRI and CAT scan.  If no procedures anticipated may place patient on Lovenox. Code Status: Full code. Family Communication: Patient's  daughter. Disposition Plan: Home. Consults called: Infectious disease. Admission status: Inpatient.   Rise Patience MD [facility] Pager [phone].  If 7PM-7AM, please contact night-coverage [email or web address] Password [email or web address]  06/07/2017, 3:35 AM

## 2017-06-07 NOTE — Progress Notes (Signed)
PROGRESS NOTE        PATIENT DETAILS Name: Andrea Yu Age: 77 y.o. Sex: female Date of Birth: [birth year] Admit Date: 06/06/2017 Admitting Physician Eduard Clos, MD AYO:[id number], Reuel Boom, MD  Brief Narrative: Patient is a 77 y.o. female with history of PAF not on long-term anticoagulation, psoriatic arthritis on chronic steroids recent history of group B streptococcus bacteremia and T-spine discitis-on outpatient antimicrobial therapy with Rocephin which was then changed to vancomycin-presenting with confusion, fever and worsening back pain.  Subjective: Appears to be mildly confused but easily redirectable-no chest pain or shortness of breath.  Lying comfortably in bed.  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  Due to new onset neutropenia-has been started on daptomycin and ciprofloxacin.  Will reattempt MRI later today or tomorrow with some IV Ativan-she currently does not have any concerning neurological findings.  Await further input from infectious disease.  Neutropenia: Thought to be secondary to vancomycin-has been changed to daptomycin.  WBC count improving.  Follow.  A. fib with RVR: Rate better controlled-continue Cardizem and metoprolol.  Per review of prior notes-not on anticoagulation secondary to prior history of bleeding and fall risk.  Hypertension: Controlled-continue losartan, Lasix, Cardizem and metoprolol.  Psoriatic arthritis: Currently on stress dose of hydrocortisone-we will slowly transition back to usual dosing of prednisone.  Chronic pain syndrome: Appears to be stable-continue Neurontin, Cymbalta and Robaxin.  Back pain has worsened-hence on as needed Norco as well.  Plans are  to repeat MRI.  Frequent falls: Claims has fallen twice in the past week or so-obtain PT evaluation.  DVT Prophylaxis: Prophylactic Lovenox   Code Status: Full code  Family Communication: None at bedside  Disposition Plan: Remain inpatient-we will require several more days of hospitalization-obtain PT evaluation to determine appropriate disposition.  Antimicrobial agents: Anti-infectives (From admission, onward)   Start     Dose/Rate Route Frequency Ordered Stop   06/08/17 0600  DAPTOmycin (CUBICIN) 420 mg in sodium chloride 0.9 % IVPB     420 mg 216.8 mL/hr over 30 Minutes Intravenous Every 24 hours 06/07/17 0249     06/07/17 1800  ciprofloxacin (CIPRO) IVPB 400 mg     400 mg 200 mL/hr over 60 Minutes Intravenous 2 times daily 06/07/17 0249     06/07/17 0215  ciprofloxacin (CIPRO) IVPB 400 mg     400 mg 200 mL/hr over 60 Minutes Intravenous  Once 06/07/17 0203 06/07/17 0400   06/07/17 0215  DAPTOmycin (CUBICIN) 420 mg in sodium chloride 0.9 % IVPB     420 mg 216.8 mL/hr over 30 Minutes Intravenous Every 24 hours 06/07/17 0203        Procedures: None  CONSULTS:  ID  Time spent: 25- minutes-Greater than 50% of this time was spent in counseling, explanation of diagnosis, planning of further management, and coordination of care.  MEDICATIONS: Scheduled Meds: . aspirin EC  81 mg Oral QPM  . diltiazem  180 mg Oral QHS  . DULoxetine  90 mg Oral Daily  . enoxaparin (LOVENOX) injection  40 mg Subcutaneous Daily  . furosemide  80 mg Oral Daily  . gabapentin  600 mg Oral Daily   And  . gabapentin  600 mg Oral QHS  . hydrocortisone sod succinate (SOLU-CORTEF) inj  50 mg Intravenous Q8H  . losartan  100 mg Oral Daily  . magnesium oxide  400 mg Oral QPM  . metoprolol succinate  50 mg Oral Daily  . multivitamin with minerals  1 tablet Oral Daily  . pantoprazole  40 mg Oral Daily  . potassium chloride SA  20 mEq Oral BID  . Teriparatide (Recombinant)  20 mcg Subcutaneous  Daily  . vitamin C  500 mg Oral BID   Continuous Infusions: . ciprofloxacin    . DAPTOmycin (CUBICIN)  IV 420 mg (06/07/17 0251)  . [START ON 06/08/2017] DAPTOmycin (CUBICIN)  IV     PRN Meds:.acetaminophen **OR** acetaminophen, albuterol, HYDROcodone-acetaminophen, methocarbamol, ondansetron **OR** ondansetron (ZOFRAN) IV, polyvinyl alcohol   PHYSICAL EXAM: Vital signs: Vitals:   06/07/17 0230 06/07/17 0245 06/07/17 0344 06/07/17 0904  BP: (!) 142/65 124/60 (!) 134/95 132/74  Pulse: (!) 113 (!) 108 90 90  Resp: 18 20    Temp:   98.8 F (37.1 C)   TempSrc:   Oral   SpO2: 97% 98% 100%   Weight:   68.5 kg (151 lb)   Height:   4\' 11"  (1.499 m)    Filed Weights   06/06/17 2250 06/07/17 0344  Weight: 69.4 kg (153 lb) 68.5 kg (151 lb)   Body mass index is 30.5 kg/m.   General appearance :Awake, only mildly confused, not in any distress.  Chronically sick appearing. HEENT: Atraumatic and Normocephalic Neck: supple, no JVD. No cervical lymphadenopathy. No thyromegaly Resp:Good air entry bilaterally, no added sounds  CVS: S1 S2 regular, no murmurs.  GI: Bowel sounds present, Non tender and not distended with no gaurding, rigidity or rebound.No organomegaly Extremities: B/L Lower Ext shows no edema, both legs are warm to touch Neurology:  speech clear,Non focal, sensation is grossly intact. Musculoskeletal:No digital cyanosis Skin:No Rash, warm and dry Wounds:N/A  I have personally reviewed following labs and imaging studies  LABORATORY DATA: CBC: Recent Labs  Lab 06/06/17 2245 06/07/17 0604  WBC 1.9* 2.8*  NEUTROABS 0.0* 0.1*  HGB 10.3* 8.9*  HCT 32.3* 27.6*  MCV 84.8 83.9  PLT 541* 528*    Basic Metabolic Panel: Recent Labs  Lab 06/06/17 2245 06/07/17 0604  NA 139 140  K 3.2* 3.0*  CL 106 109  CO2 19* 19*  GLUCOSE 91 73  BUN 5* 5*  CREATININE 0.65 0.54  CALCIUM 8.7* 8.4*    GFR: Estimated Creatinine Clearance: 48.8 mL/min (by C-G formula based on SCr  of 0.54 mg/dL).  Liver Function Tests: Recent Labs  Lab 06/06/17 2245 06/07/17 0604  AST 14* 11*  ALT 11* 9*  ALKPHOS 147* 131*  BILITOT 0.8 0.8  PROT 6.0* 5.3*  ALBUMIN 2.6* 2.3*   No results for input(s): LIPASE, AMYLASE in the last 168 hours. No results for input(s): AMMONIA in the last 168 hours.  Coagulation Profile: Recent Labs  Lab 06/06/17 2245  INR 1.19    Cardiac Enzymes: No results for input(s): CKTOTAL, CKMB, CKMBINDEX, TROPONINI in the last 168 hours.  BNP (last 3 results) No results for input(s): PROBNP in the last 8760 hours.  HbA1C: No results for input(s): HGBA1C in the last 72 hours.  CBG: No results for input(s): GLUCAP in the last 168 hours.  Lipid Profile: No results for input(s): CHOL, HDL, LDLCALC, TRIG, CHOLHDL, LDLDIRECT in the last 72 hours.  Thyroid Function Tests: Recent Labs    06/07/17 0604  TSH 1.339    Anemia Panel: No results for input(s): VITAMINB12, FOLATE, FERRITIN, TIBC, IRON, RETICCTPCT in the last 72 hours.  Urine analysis:    Component Value Date/Time   COLORURINE YELLOW 06/06/2017 2245   APPEARANCEUR CLOUDY (A) 06/06/2017 2245   LABSPEC 1.018 06/06/2017 2245   PHURINE 6.0 06/06/2017 2245   GLUCOSEU NEGATIVE 06/06/2017 2245   HGBUR NEGATIVE 06/06/2017 2245   BILIRUBINUR NEGATIVE 06/06/2017 2245   KETONESUR 20 (A) 06/06/2017 2245   PROTEINUR 30 (A) 06/06/2017 2245   UROBILINOGEN 0.2 09/14/2014 1637   NITRITE NEGATIVE 06/06/2017 2245   LEUKOCYTESUR NEGATIVE 06/06/2017 2245    Sepsis Labs: Lactic Acid, Venous    Component Value Date/Time   LATICACIDVEN 1.53 06/06/2017 2251    MICROBIOLOGY: No results found for this or any previous visit (from the past 240 hour(s)).  RADIOLOGY STUDIES/RESULTS: Dg Chest 2 View  Result Date: 06/06/2017 CLINICAL DATA:  77 y/o F; 1 week of back pain and confusion with fever. EXAM: CHEST - 2 VIEW COMPARISON:  05/04/2017 chest radiograph FINDINGS: Stable normal cardiac  silhouette given projection and technique. Aortic atherosclerosis with calcification. Bronchitic changes in the left lung base. No focal consolidation. No pleural effusion or pneumothorax. 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 Right upper quadrant cholecystectomy clips. IMPRESSION: 1. Bronchitic changes in the left lung base. No focal consolidation. 2. Aortic atherosclerosis. Electronically Signed   By: Mitzi Hansen M.D.   On: 06/06/2017 23:36   Ct Head Wo Contrast  Result Date: 06/07/2017 CLINICAL DATA:  77 y/o F; fever and back pain. Altered mental status. EXAM: CT HEAD WITHOUT CONTRAST TECHNIQUE: Contiguous axial images were obtained from the base of the skull through the vertex without intravenous contrast. COMPARISON:  09/14/2014 CT head FINDINGS: Brain: No evidence of acute infarction, hemorrhage, hydrocephalus, extra-axial collection or mass lesion/mass effect. Stable chronic microvascular ischemic changes and parenchymal volume loss of the brain. Vascular: No hyperdense vessel or unexpected calcification. Skull: Normal. Negative for fracture or focal lesion. Sinuses/Orbits: No acute finding. Other: None. IMPRESSION: 1. No acute intracranial abnormality identified. 2. Stable chronic microvascular ischemic changes and parenchymal volume loss of the brain. Electronically Signed   By: Mitzi Hansen M.D.   On: 06/07/2017 05:20   Dg Foot Complete Right  Result Date: 06/07/2017 CLINICAL DATA:  77 y/o F; wound on the bottom of the heel. History of ankle fusion 2017. EXAM: RIGHT FOOT COMPLETE - 3+ VIEW COMPARISON:  07/12/2016 right ankle radiograph FINDINGS: There is a rod fixing the ankle joint with a transversely oriented interlocking screw through the calcaneus. Mild increase in periprosthetic lucency surrounding the rod and interlocking screw within the calcaneus and lower tibia. Severe  degenerative changes of the tibiotalar joint with flattening of talar dome. Pes planus. Bones are diffusely demineralized and there is vascular calcification. Soft tissue swelling of the heel, no bony destructive change to suggest osteomyelitis. IMPRESSION: 1. No findings of osteomyelitis. 2. Mildly increased periprosthetic lucency along the rod traversing ankle fusion and interlocking screw within the calcaneus probably representing hardware loosening. Electronically Signed   By: Mitzi Hansen M.D.   On: 06/07/2017 03:45     LOS: 0 days   Jeoffrey Massed, MD  [facility] Pager:[phone]  If 7PM-7AM, please contact night-coverage [email or web address] Password [facility] 06/07/2017, 10:42 AM

## 2017-06-07 NOTE — Progress Notes (Signed)
Accompanied pt to CT scan and MRI

## 2017-06-07 NOTE — Progress Notes (Signed)
Pharmacy Antibiotic Note  Andrea Yu is a 77 y.o. female admitted on 06/06/2017 with fevers/abdominal pain.  H/i GBS bacteremia, currently receiving Vancomycin as outpt.  Pharmacy has been consulted for Daptomycin and Cipro dosing.  Plan: Daptomycin 420 mg IV q24h Cipro 400 mg IV q12h  Height: 4\' 11"  (149.9 cm) Weight: 153 lb (69.4 kg) IBW/kg (Calculated) : 43.2  Temp (24hrs), Avg:99.4 F (37.4 C), Min:98.7 F (37.1 C), Max:100 F (37.8 C)  Recent Labs  Lab 06/06/17 2245 06/06/17 2251  WBC 1.9*  --   CREATININE 0.65  --   LATICACIDVEN  --  1.53    Estimated Creatinine Clearance: 49.1 mL/min (by C-G formula based on SCr of 0.65 mg/dL).    Allergies  Allergen Reactions  . Penicillins Anaphylaxis    THROAT SWELLING  Has patient had a PCN reaction causing immediate rash, facial/tongue/throat swelling, SOB or lightheadedness with hypotension:  # # YES # #  Has patient had a PCN reaction causing severe rash involving mucus membranes or skin necrosis: # # # YES # # # Has patient had a PCN reaction that required hospitalization:No Has patient had a PCN reaction occurring within the last 10 years:  # # YES # #  If all of the above answers are "NO", then may proceed with Cephalosporin use.   06/08/17 [Denosumab] Other (See Comments)    Severe Pain in the groin area.  . Fosamax [Alendronate Sodium]     UNSPECIFIED REACTION     Jake Seats 06/07/2017 2:42 AM

## 2017-06-07 NOTE — Consult Note (Signed)
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 continue her current anbx 2. Watch her anc 3. await her Cx 4. Await repeat imaging of her spine.   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 aspirin EC  81 mg Oral QPM  . diltiazem  180 mg Oral QHS  . DULoxetine  90 mg Oral Daily  . enoxaparin (LOVENOX) injection  40 mg Subcutaneous Daily  . furosemide  80 mg Oral Daily  . gabapentin  600 mg Oral Daily   And  . gabapentin  600 mg Oral QHS  . hydrocortisone sod succinate (SOLU-CORTEF) inj  50 mg Intravenous Q8H  . losartan  100 mg Oral Daily  . magnesium oxide  400 mg Oral QPM  . metoprolol succinate  50 mg Oral Daily  . multivitamin with minerals  1 tablet Oral Daily  . pantoprazole  40 mg Oral Daily  . potassium chloride SA  20 mEq Oral BID  . Teriparatide (Recombinant)  20 mcg Subcutaneous Daily  . vitamin C  500 mg Oral BID    HPI: Andrea Yu is a 77 y.o. female with hx of Group B strep bacteremia and R pelvis fluid collection /abscess (aspirate -). She was d/c home on levaquin (March). She returned in April with discitis T10-11 and abscess. She was d/c SNF on ceftriaxone (end 5-30). She he ID f/u appt 4-18 and was felt to be doing well.  She was noted on 5-8 to have worsening ANC (200) and her ceftriazone was stopped. She was also noted to have increased Cr. She was  changed to vanco. Her f/u labs noted continued decrease in [place] (0).  Her Cr is now 0.65 In ED her temp was 100, she was tachycardic, normotensive.   Review of Systems: Review of Systems  Constitutional: Positive for chills and fever.  HENT: Negative for sore throat.   Respiratory: Negative for cough, sputum production and shortness of breath.   Gastrointestinal: Negative for constipation and diarrhea.  Genitourinary: Negative for dysuria.  Endo/Heme/Allergies: Does not bruise/bleed easily.  Please see HPI. All other systems reviewed and negative.   Past Medical History:  Diagnosis Date  . A-fib ([place])    "just after knee 2nd OR" (11/07/2012)  . Anemia   . Arthritis    RA , SACROTIC ARTHRITIS  . Complication of anesthesia    found to be in afib when she woke up  . Dysrhythmia   . GERD (gastroesophageal reflux disease)   . Head injury, closed, with concussion   . History of blood transfusion    "after 1 of my knee ORs and today" (11/07/2012)  . Hypertension   . Peripheral neuropathy    "from back OR in 2005; in hands and feet" (11/07/2012)  . Psoriatic arthritis ([place])  on Prednisone Rx    Social History   Tobacco Use  . Smoking status: Former Smoker    Packs/day: 0.50    Years: 30.00    Pack years: 15.00    Types: Cigarettes    Last attempt to quit: 08/09/1981    Years since quitting: 35.8  . Smokeless tobacco: Never Used  Substance Use Topics  . Alcohol use: No  . Drug use: No    Family History  Problem Relation Age of Onset  . Heart attack Father        1951-deceased   . Liver disease Mother   . Aneurysm Son 21       Deceased      Medications:  Scheduled: . aspirin EC  81 mg Oral QPM  . diltiazem  180 mg Oral QHS  . DULoxetine  90 mg Oral Daily  . enoxaparin (LOVENOX) injection  40 mg Subcutaneous Daily  . furosemide  80 mg Oral Daily  . gabapentin  600 mg Oral Daily   And  . gabapentin  600 mg Oral QHS  . hydrocortisone sod succinate  (SOLU-CORTEF) inj  50 mg Intravenous Q8H  . losartan  100 mg Oral Daily  . magnesium oxide  400 mg Oral QPM  . metoprolol succinate  50 mg Oral Daily  . multivitamin with minerals  1 tablet Oral Daily  . pantoprazole  40 mg Oral Daily  . potassium chloride SA  20 mEq Oral BID  . potassium chloride  40 mEq Oral Once  . Teriparatide (Recombinant)  20 mcg Subcutaneous Daily  . vitamin C  500 mg Oral BID    Abtx:  Anti-infectives (From admission, onward)   Start     Dose/Rate Route Frequency Ordered Stop   06/08/17 0600  DAPTOmycin (CUBICIN) 420 mg in sodium chloride 0.9 % IVPB     420 mg 216.8 mL/hr over 30 Minutes Intravenous Every 24 hours 06/07/17 0249     06/07/17 1800  ciprofloxacin (CIPRO) IVPB 400 mg     400 mg 200 mL/hr over 60 Minutes Intravenous 2 times daily 06/07/17 0249     06/07/17 0215  ciprofloxacin (CIPRO) IVPB 400 mg     400 mg 200 mL/hr over 60 Minutes Intravenous  Once 06/07/17 0203 06/07/17 0400   06/07/17 0215  DAPTOmycin (CUBICIN) 420 mg in sodium chloride 0.9 % IVPB     420 mg 216.8 mL/hr over 30 Minutes Intravenous Every 24 hours 06/07/17 0203          OBJECTIVE: Blood pressure 132/74, pulse 90, temperature 98.8 F (37.1 C), temperature source Oral, resp. rate 20, height 4' 11" (1.499 m), weight 68.5 kg (151 lb), SpO2 100 %.  Physical Exam  Constitutional: No distress.  HENT:  Mouth/Throat: No oropharyngeal exudate.  Eyes: Pupils are equal, round, and reactive to light. EOM are normal.  Neck: Normal range of motion. Neck supple.  Cardiovascular: Normal rate and normal heart sounds.  Pulmonary/Chest: Effort normal and breath sounds normal.  Abdominal: Soft. Bowel sounds are normal. There is no tenderness.  Musculoskeletal: She exhibits no edema.       Arms:      Feet:    Lab Results Results for orders placed or performed during the hospital encounter of 06/06/17 (from the past 48 hour(s))  Comprehensive metabolic panel     Status: Abnormal    Collection Time: 06/06/17 10:45 PM  Result Value Ref Range   Sodium 139 135 - 145 mmol/L   Potassium  3.2 (L) 3.5 - 5.1 mmol/L   Chloride 106 101 - 111 mmol/L   CO2 19 (L) 22 - 32 mmol/L   Glucose, Bld 91 65 - 99 mg/dL   BUN 5 (L) 6 - 20 mg/dL   Creatinine, Ser 0.65 0.44 - 1.00 mg/dL   Calcium 8.7 (L) 8.9 - 10.3 mg/dL   Total Protein 6.0 (L) 6.5 - 8.1 g/dL   Albumin 2.6 (L) 3.5 - 5.0 g/dL   AST 14 (L) 15 - 41 U/L   ALT 11 (L) 14 - 54 U/L   Alkaline Phosphatase 147 (H) 38 - 126 U/L   Total Bilirubin 0.8 0.3 - 1.2 mg/dL   GFR calc non Af Amer >60 >60 mL/min   GFR calc Af Amer >60 >60 mL/min    Comment: (NOTE) The eGFR has been calculated using the CKD EPI equation. This calculation has not been validated in all clinical situations. eGFR's persistently <60 mL/min signify possible Chronic Kidney Disease.    Anion gap 14 5 - 15    Comment: Performed at [facility] [address] [place], [place] [address]  CBC with Differential     Status: Abnormal   Collection Time: 06/06/17 10:45 PM  Result Value Ref Range   WBC 1.9 (L) 4.0 - 10.5 K/uL   RBC 3.81 (L) 3.87 - 5.11 MIL/uL   Hemoglobin 10.3 (L) 12.0 - 15.0 g/dL   HCT 32.3 (L) 36.0 - 46.0 %   MCV 84.8 78.0 - 100.0 fL   MCH 27.0 26.0 - 34.0 pg   MCHC 31.9 30.0 - 36.0 g/dL   RDW 16.0 (H) 11.5 - 15.5 %   Platelets 541 (H) 150 - 400 K/uL   Neutrophils Relative % 1 %   Lymphocytes Relative 56 %   Monocytes Relative 38 %   Eosinophils Relative 1 %   Basophils Relative 4 %   Neutro Abs 0.0 (L) 1.7 - 7.7 K/uL   Lymphs Abs 1.1 0.7 - 4.0 K/uL   Monocytes Absolute 0.7 0.1 - 1.0 K/uL   Eosinophils Absolute 0.0 0.0 - 0.7 K/uL   Basophils Absolute 0.1 0.0 - 0.1 K/uL   RBC Morphology POLYCHROMASIA PRESENT     Comment: Performed at [facility], [address], [place], [place] [address]  Protime-INR     Status: None   Collection Time: 06/06/17 10:45 PM  Result Value Ref Range   Prothrombin Time 15.0 11.4 - 15.2 seconds    INR 1.19     Comment: Performed at [facility] [address], [place],  [address]  Urinalysis, Routine w reflex microscopic     Status: Abnormal   Collection Time: 06/06/17 10:45 PM  Result Value Ref Range   Color, Urine YELLOW YELLOW   APPearance CLOUDY (A) CLEAR   Specific Gravity, Urine 1.018 1.005 - 1.030   pH 6.0 5.0 - 8.0   Glucose, UA NEGATIVE NEGATIVE mg/dL   Hgb urine dipstick NEGATIVE NEGATIVE   Bilirubin Urine NEGATIVE NEGATIVE   Ketones, ur 20 (A) NEGATIVE mg/dL   Protein, ur 30 (A) NEGATIVE mg/dL   Nitrite NEGATIVE NEGATIVE   Leukocytes, UA NEGATIVE NEGATIVE   RBC / HPF 0-5 0 - 5 RBC/hpf   WBC, UA 11-20 0 - 5 WBC/hpf   Bacteria, UA RARE (A) NONE SEEN   Squamous Epithelial / LPF 0-5 0 - 5   Mucus PRESENT    Amorphous Crystal PRESENT     Comment: Performed at  [facility], [place] [address] [place], [place] [address]  I-Stat CG4 Lactic Acid, ED     Status: None   Collection Time: 06/06/17 10:51 PM  Result Value Ref Range   Lactic Acid, Venous 1.53 0.5 - 1.9 mmol/L  Comprehensive metabolic panel     Status: Abnormal   Collection Time: 06/07/17  6:04 AM  Result Value Ref Range   Sodium 140 135 - 145 mmol/L   Potassium 3.0 (L) 3.5 - 5.1 mmol/L   Chloride 109 101 - 111 mmol/L   CO2 19 (L) 22 - 32 mmol/L   Glucose, Bld 73 65 - 99 mg/dL   BUN 5 (L) 6 - 20 mg/dL   Creatinine, Ser 0.54 0.44 - 1.00 mg/dL   Calcium 8.4 (L) 8.9 - 10.3 mg/dL   Total Protein 5.3 (L) 6.5 - 8.1 g/dL   Albumin 2.3 (L) 3.5 - 5.0 g/dL   AST 11 (L) 15 - 41 U/L   ALT 9 (L) 14 - 54 U/L   Alkaline Phosphatase 131 (H) 38 - 126 U/L   Total Bilirubin 0.8 0.3 - 1.2 mg/dL   GFR calc non Af Amer >60 >60 mL/min   GFR calc Af Amer >60 >60 mL/min    Comment: (NOTE) The eGFR has been calculated using the CKD EPI equation. This calculation has not been validated in all clinical situations. eGFR's persistently <60 mL/min signify possible Chronic Kidney Disease.    Anion gap 12 5 - 15      Comment: Performed at [facility] [address] [place], [place] [address]  CBC WITH DIFFERENTIAL     Status: Abnormal   Collection Time: 06/07/17  6:04 AM  Result Value Ref Range   WBC 2.8 (L) 4.0 - 10.5 K/uL   RBC 3.29 (L) 3.87 - 5.11 MIL/uL   Hemoglobin 8.9 (L) 12.0 - 15.0 g/dL    Comment: REPEATED TO VERIFY   HCT 27.6 (L) 36.0 - 46.0 %   MCV 83.9 78.0 - 100.0 fL   MCH 27.1 26.0 - 34.0 pg   MCHC 32.2 30.0 - 36.0 g/dL   RDW 16.1 (H) 11.5 - 15.5 %   Platelets 528 (H) 150 - 400 K/uL   Neutrophils Relative % 3 %   Lymphocytes Relative 70 %   Monocytes Relative 24 %   Eosinophils Relative 3 %   Basophils Relative 0 %   Band Neutrophils 0 %   Metamyelocytes Relative 0 %   Myelocytes 0 %   Promyelocytes Relative 0 %   Blasts 0 %   nRBC 0 0 /100 WBC   Other 0 %   Neutro Abs 0.1 (L) 1.7 - 7.7 K/uL   Lymphs Abs 1.9 0.7 - 4.0 K/uL   Monocytes Absolute 0.7 0.1 - 1.0 K/uL   Eosinophils Absolute 0.1 0.0 - 0.7 K/uL   Basophils Absolute 0.0 0.0 - 0.1 K/uL   RBC Morphology POLYCHROMASIA PRESENT     Comment: Performed at [facility], [place] [address], [place], [place] [address]  TSH     Status: None   Collection Time: 06/07/17  6:04 AM  Result Value Ref Range   TSH 1.339 0.350 - 4.500 uIU/mL    Comment: Performed by a 3rd Generation assay with a functional sensitivity of <=0.01 uIU/mL. Performed at [facility], [place] [address] [place], [place] [address]       Component Value Date/Time   SDES  04/29/2017 2100  URINE, RANDOM Performed at [facility], [place] [address] [place], [place] [address]    [place]  04/29/2017 2100    NONE Performed at [facility], [place] [address], [place], [place] [address]    CULT  04/29/2017 2100    NO GROWTH Performed at [facility] [address] [place], [place] [address]    REPTSTATUS 05/01/2017 FINAL 04/29/2017 2100   Dg Chest 2 View  Result Date: 06/06/2017 CLINICAL  DATA:  77 y/o F; 1 week of back pain and confusion with fever. EXAM: CHEST - 2 VIEW COMPARISON:  05/04/2017 chest radiograph FINDINGS: Stable normal cardiac silhouette given projection and technique. Aortic atherosclerosis with calcification. Bronchitic changes in the left lung base. No focal consolidation. No pleural effusion or pneumothorax. 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 Right upper quadrant cholecystectomy clips. IMPRESSION: 1. Bronchitic changes in the left lung base. No focal consolidation. 2. Aortic atherosclerosis. Electronically Signed   By: Kristine Garbe M.D.   On: 06/06/2017 23:36   Ct Head Wo Contrast  Result Date: 06/07/2017 CLINICAL DATA:  77 y/o F; fever and back pain. Altered mental status. EXAM: CT HEAD WITHOUT CONTRAST TECHNIQUE: Contiguous axial images were obtained from the base of the skull through the vertex without intravenous contrast. COMPARISON:  09/14/2014 CT head FINDINGS: Brain: No evidence of acute infarction, hemorrhage, hydrocephalus, extra-axial collection or mass lesion/mass effect. Stable chronic microvascular ischemic changes and parenchymal volume loss of the brain. Vascular: No hyperdense vessel or unexpected calcification. Skull: Normal. Negative for fracture or focal lesion. Sinuses/Orbits: No acute finding. Other: None. IMPRESSION: 1. No acute intracranial abnormality identified. 2. Stable chronic microvascular ischemic changes and parenchymal volume loss of the brain. Electronically Signed   By: Kristine Garbe M.D.   On: 06/07/2017 05:20   Dg Foot Complete Right  Result Date: 06/07/2017 CLINICAL DATA:  77 y/o F; wound on the bottom of the heel. History of ankle fusion 2017. EXAM: RIGHT FOOT COMPLETE - 3+ VIEW COMPARISON:  07/12/2016 right ankle radiograph FINDINGS: There is a rod fixing the ankle joint with a transversely oriented interlocking screw  through the calcaneus. Mild increase in periprosthetic lucency surrounding the rod and interlocking screw within the calcaneus and lower tibia. Severe degenerative changes of the tibiotalar joint with flattening of talar dome. Pes planus. Bones are diffusely demineralized and there is vascular calcification. Soft tissue swelling of the heel, no bony destructive change to suggest osteomyelitis. IMPRESSION: 1. No findings of osteomyelitis. 2. Mildly increased periprosthetic lucency along the rod traversing ankle fusion and interlocking screw within the calcaneus probably representing hardware loosening. Electronically Signed   By: Kristine Garbe M.D.   On: 06/07/2017 03:45   No results found for this or any previous visit (from the past 240 hour(s)).  Microbiology: No results found for this or any previous visit (from the past 240 hour(s)).  Radiographs and labs were personally reviewed by me.   Bobby Rumpf, MD [facility] [phone] 06/07/2017, 10:48 AM

## 2017-06-07 NOTE — Progress Notes (Signed)
Initial Nutrition Assessment  DOCUMENTATION CODES:   Obesity unspecified  INTERVENTION:   -MVI daily -30 ml Prostat BID, each supplement provides 100 kcals and 15 grams protein -Downgrade diet to dysphagia 2 (mechanical soft) for ease of intake (poor dentition).   NUTRITION DIAGNOSIS:   Increased nutrient needs related to wound healing as evidenced by estimated needs.  GOAL:   Patient will meet greater than or equal to 90% of their needs  MONITOR:   PO intake, Supplement acceptance, Labs, Weight trends, Skin, I & O's  REASON FOR ASSESSMENT:   Low [place]    ASSESSMENT:   Andrea Yu is a 77 y.o. 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   Pt admitted with SIRS.   Reviewed I/O's: +1/1 L since admission.   Case discussed with RN prior to visit, who confirmed presence of stage 2 pressure injury to rt ankle. Pt has been experiencing periods of intermittent confusion. She shares pt came from home after recently spending time at short term SNF.   Spoke with pt at bedside, who reports fair appetite. She reports she ate some eggs from breakfast and ordered soup for lunch due to poor dentition (pt ywears dentures, and pt daughter is supposed to be bringing in to pt later on today). Pt reports she generally consumes 3 meals per day- Breakfast: grits, eggs, and toast, Lunch and Dinner: chicken and vegetables). She endorses intentional wt loss over the past 3 years secondary to choosing more healthful food options and watching portions, as her husband has DM. Pt has experienced a 23% wt loss over the past year, per wt hx, which is significant for time frame.   Discussed  with pt importance of good meal and supplement intake to promote healing. She shares that she is on a MVI, as she is followed by the wound center. She reports she has been bedbound for the past 2 years secondary to ankle wound (suspect fat and muscle depletions found are related to advanced age and bedbound status). She is amenable to diet downgrade due to poor dentition).   Medications reviewed and include magnesium oxide, MVI, KCl, and vitamin C.   Labs reviewed: K: 3.0 (on PO supplementation).   NUTRITION - FOCUSED PHYSICAL EXAM:    Most Recent Value  Orbital Region  No depletion  Upper Arm Region  Mild depletion  Thoracic and Lumbar Region  No depletion  Buccal Region  No depletion  Temple Region  No depletion  Clavicle Bone Region  No depletion  Clavicle and Acromion Bone Region  No depletion  Scapular Bone Region  No depletion  Dorsal Hand  Mild depletion  Patellar Region  Mild depletion  Anterior Thigh Region  Mild depletion  Posterior Calf Region  Mild depletion  Edema (RD Assessment)  Mild  Hair  Reviewed  Eyes  Reviewed  Mouth  Reviewed  Skin  Reviewed  Nails  Reviewed       Diet Order:   Diet Order           Diet Heart Room service appropriate? Yes; Fluid consistency: Thin; Fluid restriction: 1200 mL Fluid  Diet effective now          EDUCATION NEEDS:  Education needs have been addressed  Skin:  Skin Integrity Issues:: Incisions, Stage II Stage II: rt heel Incisions: rt buttocks  Last BM:  06/05/17  Height:   Ht Readings from Last 1 Encounters:  06/07/17 4\' 11"  (1.499 m)    Weight:   Wt Readings from Last 1 Encounters:  06/07/17 151 lb (68.5 kg)    Ideal Body Weight:  44.5 kg  BMI:  Body mass index is 30.5 kg/m.  Estimated Nutritional Needs:   Kcal:  1700-1900  Protein:  90-105 grams  Fluid:  > 1.7 L    Finis Hendricksen A. 06/09/17, RD, LDN, CDE Pager: [phone] After hours Pager: [phone]

## 2017-06-07 NOTE — Progress Notes (Signed)
[facility]  Active pt with [facility] for home IV ABX prior to this admission.  [facility] will follow pt while inpatient at [facility] to support transition home when ordered.  If patient discharges after hours, please call [phone].   Sedalia Muta 06/07/2017, 9:42 AM

## 2017-06-07 NOTE — Progress Notes (Signed)
MD made aware that patient has been refusing MRI.

## 2017-06-07 NOTE — ED Notes (Signed)
Patient transported to X-ray 

## 2017-06-08 DIAGNOSIS — M4640 Discitis, unspecified, site unspecified: Secondary | ICD-10-CM

## 2017-06-08 DIAGNOSIS — M86151 Other acute osteomyelitis, right femur: Secondary | ICD-10-CM

## 2017-06-08 DIAGNOSIS — T50905A Adverse effect of unspecified drugs, medicaments and biological substances, initial encounter: Secondary | ICD-10-CM

## 2017-06-08 DIAGNOSIS — M4620 Osteomyelitis of vertebra, site unspecified: Secondary | ICD-10-CM

## 2017-06-08 DIAGNOSIS — M869 Osteomyelitis, unspecified: Secondary | ICD-10-CM

## 2017-06-08 LAB — VITAMIN B12: Vitamin B-12: 1375 pg/mL — ABNORMAL HIGH (ref 180–914)

## 2017-06-08 LAB — CBC WITH DIFFERENTIAL/PLATELET
BASOS ABS: 0 10*3/uL (ref 0.0–0.1)
Basophils Relative: 1 %
Eosinophils Absolute: 0 10*3/uL (ref 0.0–0.7)
Eosinophils Relative: 0 %
HEMATOCRIT: 26.8 % — AB (ref 36.0–46.0)
Hemoglobin: 8.3 g/dL — ABNORMAL LOW (ref 12.0–15.0)
LYMPHS ABS: 0.8 10*3/uL (ref 0.7–4.0)
Lymphocytes Relative: 47 %
MCH: 26.2 pg (ref 26.0–34.0)
MCHC: 31 g/dL (ref 30.0–36.0)
MCV: 84.5 fL (ref 78.0–100.0)
MONOS PCT: 44 %
Monocytes Absolute: 0.7 10*3/uL (ref 0.1–1.0)
NEUTROS ABS: 0.1 10*3/uL — AB (ref 1.7–7.7)
Neutrophils Relative %: 8 %
Platelets: 540 10*3/uL — ABNORMAL HIGH (ref 150–400)
RBC: 3.17 MIL/uL — ABNORMAL LOW (ref 3.87–5.11)
RDW: 15.2 % (ref 11.5–15.5)
WBC: 1.6 10*3/uL — ABNORMAL LOW (ref 4.0–10.5)

## 2017-06-08 LAB — BASIC METABOLIC PANEL
Anion gap: 9 (ref 5–15)
BUN: 12 mg/dL (ref 6–20)
CO2: 24 mmol/L (ref 22–32)
CREATININE: 0.61 mg/dL (ref 0.44–1.00)
Calcium: 8.6 mg/dL — ABNORMAL LOW (ref 8.9–10.3)
Chloride: 106 mmol/L (ref 101–111)
GFR calc Af Amer: >60 mL/min (ref >60)
GLUCOSE: 126 mg/dL — AB (ref 65–99)
POTASSIUM: 3.7 mmol/L (ref 3.5–5.1)
SODIUM: 139 mmol/L (ref 135–145)

## 2017-06-08 LAB — URINE CULTURE

## 2017-06-08 LAB — HIV ANTIBODY (ROUTINE TESTING W REFLEX): HIV SCREEN 4TH GENERATION: NONREACTIVE

## 2017-06-08 LAB — CK: Total CK: 13 U/L — ABNORMAL LOW (ref 38–234)

## 2017-06-08 NOTE — Progress Notes (Signed)
PROGRESS NOTE        PATIENT DETAILS Name: Andrea Yu Age: 77 y.o. Sex: female Date of Birth: 1940-07-06 Admit Date: 06/06/2017 Admitting Physician Eduard Clos, MD WYO:[id number], Reuel Boom, MD  Brief Narrative: Patient is a 77 y.o. female with history of PAF not on long-term anticoagulation, psoriatic arthritis on chronic steroids recent history of group B streptococcus bacteremia and T-spine discitis-on outpatient antimicrobial therapy with Rocephin which was then changed to vancomycin-presenting with confusion, fever and worsening back pain.  Subjective: No fever overnight-sleeping when I walked in-was able to answer most of my questions appropriately-does not seem confused when compared to yesterday.  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  Refuses to complete MRI with IV Ativan.  Briefly tried to reassure patient-but she was just not willing to complete MRI.  She has no concerning neurological findings-may still be appropriate to continue IV antimicrobial therapy-and to only push for an MRI under sedation if clinical scenario changes.  Will await further input from infectious disease.  Acute metabolic encephalopathy: Likely secondary to above-her mentation is improved-see above  Neutropenia: Thought to be secondary to antibiotic-likely vancomycin-continues to be neutropenic-to complete work-up will check vitamin B12 and HIV.  Follow for now.  A. fib with RVR: Rate controlled-continue with Cardizem and metoprolol, reviewed prior notes-not a candidate for anticoagulation due to history of bleeding and fall risk.    Hypertension: Controlled continue Lasix, losartan and Cardizem.  Psoriatic arthritis: We will slowly decrease hydrocortisone and transition back to usual  dosing of prednisone.    Chronic pain syndrome: Appears to be stable-continue Neurontin, Cymbalta and Robaxin.  Back pain has worsened-hence on as needed Norco as well.  Plans are to repeat MRI.  Frequent falls: Claims has fallen twice in the past week or so-await PT evaluation.  DVT Prophylaxis: Prophylactic Lovenox   Code Status: Full code  Family Communication: None at bedside-we will reach out to family over the next day or so.  Disposition Plan: Remain inpatient-we will require several more days of hospitalization-obtain PT evaluation to determine appropriate disposition.  Antimicrobial agents: Anti-infectives (From admission, onward)   Start     Dose/Rate Route Frequency Ordered Stop   06/08/17 0600  DAPTOmycin (CUBICIN) 420 mg in sodium chloride 0.9 % IVPB  Status:  Discontinued     420 mg 216.8 mL/hr over 30 Minutes Intravenous Every 24 hours 06/07/17 0249 06/07/17 1351   06/07/17 1800  ciprofloxacin (CIPRO) IVPB 400 mg     400 mg 200 mL/hr over 60 Minutes Intravenous 2 times daily 06/07/17 0249     06/07/17 0215  ciprofloxacin (CIPRO) IVPB 400 mg     400 mg 200 mL/hr over 60 Minutes Intravenous  Once 06/07/17 0203 06/07/17 0400   06/07/17 0215  DAPTOmycin (CUBICIN) 420 mg in sodium chloride 0.9 % IVPB     420 mg 216.8 mL/hr over 30 Minutes Intravenous Every 24 hours 06/07/17 0203        Procedures: None  CONSULTS:  ID  Time spent: 25- minutes-Greater than 50% of this time was spent in counseling, explanation of diagnosis, planning of further management, and coordination of care.  MEDICATIONS: Scheduled Meds: . aspirin EC  81 mg Oral QPM  . Chlorhexidine  Gluconate Cloth  6 each Topical Q0600  . diltiazem  180 mg Oral QHS  . DULoxetine  90 mg Oral Daily  . enoxaparin (LOVENOX) injection  40 mg Subcutaneous Daily  . feeding supplement (PRO-STAT SUGAR FREE 64)  30 mL Oral BID  . furosemide  80 mg Oral Daily  . gabapentin  600 mg Oral Daily   And  .  gabapentin  600 mg Oral QHS  . hydrocortisone sod succinate (SOLU-CORTEF) inj  50 mg Intravenous Q8H  . losartan  100 mg Oral Daily  . magnesium oxide  400 mg Oral QPM  . metoprolol succinate  50 mg Oral Daily  . multivitamin with minerals  1 tablet Oral Daily  . mupirocin ointment  1 application Nasal BID  . pantoprazole  40 mg Oral Daily  . potassium chloride SA  20 mEq Oral BID  . vitamin C  500 mg Oral BID   Continuous Infusions: . ciprofloxacin 400 mg (06/08/17 0430)  . DAPTOmycin (CUBICIN)  IV Stopped (06/08/17 0300)   PRN Meds:.acetaminophen **OR** acetaminophen, albuterol, HYDROcodone-acetaminophen, LORazepam, methocarbamol, ondansetron **OR** ondansetron (ZOFRAN) IV, polyvinyl alcohol, sodium chloride flush   PHYSICAL EXAM: Vital signs: Vitals:   06/07/17 1900 06/08/17 0058 06/08/17 0459 06/08/17 1100  BP: (!) 121/59 118/67 (!) 124/50 (!) 115/101  Pulse: 76 74 84 90  Resp:  18 18 18   Temp: 98.4 F (36.9 C) 98.7 F (37.1 C) 98.5 F (36.9 C) 98.1 F (36.7 C)  TempSrc: Oral Oral Oral Oral  SpO2: 99% 96% 97% 100%  Weight:   66.5 kg (146 lb 9.7 oz)   Height:       Filed Weights   06/06/17 2250 06/07/17 0344 06/08/17 0459  Weight: 69.4 kg (153 lb) 68.5 kg (151 lb) 66.5 kg (146 lb 9.7 oz)   Body mass index is 29.61 kg/m.   General appearance :: Sleepy but awake-answers all my questions appropriately. Eyes:, pupils equally reactive to light and accomodation,no scleral icterus. HEENT: Atraumatic and Normocephalic Neck: supple, no JVD. Resp:Good air entry bilaterally, no rales  CVS: S1 S2 regular, no murmurs.  GI: Bowel sounds present, Non tender and not distended with no gaurding, rigidity or rebound. Extremities: B/L Lower Ext shows no edema, both legs are warm to touch Neurology: Nonfocal Musculoskeletal:No digital cyanosis Skin:No Rash, warm and dry Wounds:N/A I have personally reviewed following labs and imaging studies  LABORATORY DATA: CBC: Recent Labs    Lab 06/06/17 2245 06/07/17 0604 06/08/17 0405  WBC 1.9* 2.8* 1.6*  NEUTROABS 0.0* 0.1* PENDING  HGB 10.3* 8.9* 8.3*  HCT 32.3* 27.6* 26.8*  MCV 84.8 83.9 84.5  PLT 541* 528* 540*    Basic Metabolic Panel: Recent Labs  Lab 06/06/17 2245 06/07/17 0604 06/08/17 0405  NA 139 140 139  K 3.2* 3.0* 3.7  CL 106 109 106  CO2 19* 19* 24  GLUCOSE 91 73 126*  BUN 5* 5* 12  CREATININE 0.65 0.54 0.61  CALCIUM 8.7* 8.4* 8.6*    GFR: Estimated Creatinine Clearance: 48 mL/min (by C-G formula based on SCr of 0.61 mg/dL).  Liver Function Tests: Recent Labs  Lab 06/06/17 2245 06/07/17 0604  AST 14* 11*  ALT 11* 9*  ALKPHOS 147* 131*  BILITOT 0.8 0.8  PROT 6.0* 5.3*  ALBUMIN 2.6* 2.3*   No results for input(s): LIPASE, AMYLASE in the last 168 hours. No results for input(s): AMMONIA in the last 168 hours.  Coagulation Profile: Recent Labs  Lab 06/06/17 2245  INR  1.19    Cardiac Enzymes: Recent Labs  Lab 06/08/17 0405  CKTOTAL 13*    BNP (last 3 results) No results for input(s): PROBNP in the last 8760 hours.  HbA1C: No results for input(s): HGBA1C in the last 72 hours.  CBG: No results for input(s): GLUCAP in the last 168 hours.  Lipid Profile: No results for input(s): CHOL, HDL, LDLCALC, TRIG, CHOLHDL, LDLDIRECT in the last 72 hours.  Thyroid Function Tests: Recent Labs    06/07/17 0604  TSH 1.339    Anemia Panel: Recent Labs    06/08/17 0821  VITAMINB12 1,375*    Urine analysis:    Component Value Date/Time   COLORURINE YELLOW 06/06/2017 2245   APPEARANCEUR CLOUDY (A) 06/06/2017 2245   LABSPEC 1.018 06/06/2017 2245   PHURINE 6.0 06/06/2017 2245   GLUCOSEU NEGATIVE 06/06/2017 2245   HGBUR NEGATIVE 06/06/2017 2245   BILIRUBINUR NEGATIVE 06/06/2017 2245   KETONESUR 20 (A) 06/06/2017 2245   PROTEINUR 30 (A) 06/06/2017 2245   UROBILINOGEN 0.2 09/14/2014 1637   NITRITE NEGATIVE 06/06/2017 2245   LEUKOCYTESUR NEGATIVE 06/06/2017 2245     Sepsis Labs: Lactic Acid, Venous    Component Value Date/Time   LATICACIDVEN 1.53 06/06/2017 2251    MICROBIOLOGY: Recent Results (from the past 240 hour(s))  Urine culture     Status: Abnormal   Collection Time: 06/07/17  1:18 AM  Result Value Ref Range Status   Specimen Description URINE, CATHETERIZED  Final   Special Requests   Final    NONE Performed at [facility], [address], [place], [place] [address]    Culture MULTIPLE SPECIES PRESENT, SUGGEST RECOLLECTION (A)  Final   Report Status 06/08/2017 FINAL  Final  MRSA PCR Screening     Status: Abnormal   Collection Time: 06/07/17  9:56 AM  Result Value Ref Range Status   MRSA by PCR POSITIVE (A) NEGATIVE Final    Comment:        The GeneXpert MRSA Assay (FDA approved for NASAL specimens only), is one component of a comprehensive MRSA colonization surveillance program. It is not intended to diagnose MRSA infection nor to guide or monitor treatment for MRSA infections. RESULT CALLED TO, READ BACK BY AND VERIFIED WITH: RN Zebedee Iba [phone] MLM Performed at [facility], [address] [place], [place] [address]     RADIOLOGY STUDIES/RESULTS: Dg Chest 2 View  Result Date: 06/06/2017 CLINICAL DATA:  77 y/o F; 1 week of back pain and confusion with fever. EXAM: CHEST - 2 VIEW COMPARISON:  05/04/2017 chest radiograph FINDINGS: Stable normal cardiac silhouette given projection and technique. Aortic atherosclerosis with calcification. Bronchitic changes in the left lung base. No focal consolidation. No pleural effusion or pneumothorax. 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 Right upper quadrant cholecystectomy clips. IMPRESSION: 1. Bronchitic changes in the left lung base. No focal consolidation. 2. Aortic atherosclerosis. Electronically Signed   By: Mitzi Hansen M.D.   On: 06/06/2017 23:36   Ct Head Wo  Contrast  Result Date: 06/07/2017 CLINICAL DATA:  77 y/o F; fever and back pain. Altered mental status. EXAM: CT HEAD WITHOUT CONTRAST TECHNIQUE: Contiguous axial images were obtained from the base of the skull through the vertex without intravenous contrast. COMPARISON:  09/14/2014 CT head FINDINGS: Brain: No evidence of acute infarction, hemorrhage, hydrocephalus, extra-axial collection or mass lesion/mass effect. Stable chronic microvascular ischemic changes and parenchymal volume loss of  the brain. Vascular: No hyperdense vessel or unexpected calcification. Skull: Normal. Negative for fracture or focal lesion. Sinuses/Orbits: No acute finding. Other: None. IMPRESSION: 1. No acute intracranial abnormality identified. 2. Stable chronic microvascular ischemic changes and parenchymal volume loss of the brain. Electronically Signed   By: Mitzi Hansen M.D.   On: 06/07/2017 05:20   Dg Foot Complete Right  Result Date: 06/07/2017 CLINICAL DATA:  77 y/o F; wound on the bottom of the heel. History of ankle fusion 2017. EXAM: RIGHT FOOT COMPLETE - 3+ VIEW COMPARISON:  07/12/2016 right ankle radiograph FINDINGS: There is a rod fixing the ankle joint with a transversely oriented interlocking screw through the calcaneus. Mild increase in periprosthetic lucency surrounding the rod and interlocking screw within the calcaneus and lower tibia. Severe degenerative changes of the tibiotalar joint with flattening of talar dome. Pes planus. Bones are diffusely demineralized and there is vascular calcification. Soft tissue swelling of the heel, no bony destructive change to suggest osteomyelitis. IMPRESSION: 1. No findings of osteomyelitis. 2. Mildly increased periprosthetic lucency along the rod traversing ankle fusion and interlocking screw within the calcaneus probably representing hardware loosening. Electronically Signed   By: Mitzi Hansen M.D.   On: 06/07/2017 03:45     LOS: 1 day   Jeoffrey Massed, MD  [facility] Pager:[phone]  If 7PM-7AM, please contact night-coverage [email or web address] Password [email or web address] 06/08/2017, 11:17 AM

## 2017-06-08 NOTE — Progress Notes (Signed)
INFECTIOUS DISEASE PROGRESS NOTE  ID: Andrea Yu is a 77 y.o. 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  Normal BM and urine. No cough or SOB.  Denies oral sores.   Abtx:  Anti-infectives (From admission, onward)   Start     Dose/Rate Route Frequency Ordered Stop   06/08/17 0600  DAPTOmycin (CUBICIN) 420 mg in sodium chloride 0.9 % IVPB  Status:  Discontinued     420 mg 216.8 mL/hr over 30 Minutes Intravenous Every 24 hours 06/07/17 0249 06/07/17 1351   06/07/17 1800  ciprofloxacin (CIPRO) IVPB 400 mg     400 mg 200 mL/hr over 60 Minutes Intravenous 2 times daily 06/07/17 0249     06/07/17 0215  ciprofloxacin (CIPRO) IVPB 400 mg     400 mg 200 mL/hr over 60 Minutes Intravenous  Once 06/07/17 0203 06/07/17 0400   06/07/17 0215  DAPTOmycin (CUBICIN) 420 mg in sodium chloride 0.9 % IVPB     420 mg 216.8 mL/hr over 30 Minutes Intravenous Every 24 hours 06/07/17 0203        Medications:  Scheduled: . aspirin EC  81 mg Oral QPM  . Chlorhexidine Gluconate Cloth  6 each Topical Q0600  . diltiazem  180 mg Oral QHS  . DULoxetine  90 mg Oral Daily  . enoxaparin (LOVENOX) injection  40 mg Subcutaneous Daily  . feeding supplement (PRO-STAT SUGAR FREE 64)  30 mL Oral BID  . furosemide  80 mg Oral Daily  . gabapentin  600 mg Oral Daily   And  . gabapentin  600 mg Oral QHS  . hydrocortisone sod succinate (SOLU-CORTEF) inj  50 mg Intravenous Q8H  . losartan  100 mg Oral Daily  . magnesium oxide  400 mg Oral QPM  . metoprolol succinate  50 mg Oral Daily  . multivitamin with minerals  1 tablet Oral Daily  . mupirocin ointment  1 application Nasal BID  . pantoprazole  40 mg Oral Daily  . potassium chloride SA  20 mEq Oral BID  . vitamin C   500 mg Oral BID    Objective: Vital signs in last 24 hours: Temp:  [98.4 F (36.9 C)-99.2 F (37.3 C)] 98.5 F (36.9 C) (05/15 0459) Pulse Rate:  [63-84] 84 (05/15 0459) Resp:  [18] 18 (05/15 0459) BP: (118-133)/(50-74) 124/50 (05/15 0459) SpO2:  [96 %-99 %] 97 % (05/15 0459) Weight:  [66.5 kg (146 lb 9.7 oz)] 66.5 kg (146 lb 9.7 oz) (05/15 0459)   General appearance: alert, cooperative and no distress Resp: clear to auscultation bilaterally Cardio: regular rate and rhythm GI: normal findings: bowel sounds normal and soft, non-tender Extremities: RUE PIC is non-tender, no cordis, no erythema.   Lab Results Recent Labs    06/07/17 0604 06/08/17 0405  WBC 2.8* 1.6*  HGB 8.9* 8.3*  HCT 27.6* 26.8*  NA 140 139  K 3.0* 3.7  CL 109 106  CO2 19* 24  BUN 5* 12  CREATININE 0.54 0.61   Liver Panel Recent Labs    06/06/17 2245 06/07/17 0604  PROT 6.0* 5.3*  ALBUMIN 2.6* 2.3*  AST 14* 11*  ALT 11* 9*  ALKPHOS 147* 131*  BILITOT 0.8 0.8   Sedimentation Rate No results for  input(s): ESRSEDRATE in the last 72 hours. C-Reactive Protein No results for input(s): CRP in the last 72 hours.  Microbiology: Recent Results (from the past 240 hour(s))  Urine culture     Status: Abnormal   Collection Time: 06/07/17  1:18 AM  Result Value Ref Range Status   Specimen Description URINE, CATHETERIZED  Final   Special Requests   Final    NONE Performed at [facility], [address], [place], [place] [address]    Culture MULTIPLE SPECIES PRESENT, SUGGEST RECOLLECTION (A)  Final   Report Status 06/08/2017 FINAL  Final  MRSA PCR Screening     Status: Abnormal   Collection Time: 06/07/17  9:56 AM  Result Value Ref Range Status   MRSA by PCR POSITIVE (A) NEGATIVE Final    Comment:        The GeneXpert MRSA Assay (FDA approved for NASAL specimens only), is one component of a comprehensive MRSA colonization surveillance program. It is not intended to diagnose  MRSA infection nor to guide or monitor treatment for MRSA infections. RESULT CALLED TO, READ BACK BY AND VERIFIED WITH: RN Zebedee Iba [phone] [id number] MLM Performed at [facility], [address] [place], [place] [address]     Studies/Results: Dg Chest 2 View  Result Date: 06/06/2017 CLINICAL DATA:  77 y/o F; 1 week of back pain and confusion with fever. EXAM: CHEST - 2 VIEW COMPARISON:  05/04/2017 chest radiograph FINDINGS: Stable normal cardiac silhouette given projection and technique. Aortic atherosclerosis with calcification. Bronchitic changes in the left lung base. No focal consolidation. No pleural effusion or pneumothorax. 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 Right upper quadrant cholecystectomy clips. IMPRESSION: 1. Bronchitic changes in the left lung base. No focal consolidation. 2. Aortic atherosclerosis. Electronically Signed   By: Mitzi Hansen M.D.   On: 06/06/2017 23:36   Ct Head Wo Contrast  Result Date: 06/07/2017 CLINICAL DATA:  77 y/o F; fever and back pain. Altered mental status. EXAM: CT HEAD WITHOUT CONTRAST TECHNIQUE: Contiguous axial images were obtained from the base of the skull through the vertex without intravenous contrast. COMPARISON:  09/14/2014 CT head FINDINGS: Brain: No evidence of acute infarction, hemorrhage, hydrocephalus, extra-axial collection or mass lesion/mass effect. Stable chronic microvascular ischemic changes and parenchymal volume loss of the brain. Vascular: No hyperdense vessel or unexpected calcification. Skull: Normal. Negative for fracture or focal lesion. Sinuses/Orbits: No acute finding. Other: None. IMPRESSION: 1. No acute intracranial abnormality identified. 2. Stable chronic microvascular ischemic changes and parenchymal volume loss of the brain. Electronically Signed   By: Mitzi Hansen M.D.   On: 06/07/2017 05:20   Dg Foot  Complete Right  Result Date: 06/07/2017 CLINICAL DATA:  77 y/o F; wound on the bottom of the heel. History of ankle fusion 2017. EXAM: RIGHT FOOT COMPLETE - 3+ VIEW COMPARISON:  07/12/2016 right ankle radiograph FINDINGS: There is a rod fixing the ankle joint with a transversely oriented interlocking screw through the calcaneus. Mild increase in periprosthetic lucency surrounding the rod and interlocking screw within the calcaneus and lower tibia. Severe degenerative changes of the tibiotalar joint with flattening of talar dome. Pes planus. Bones are diffusely demineralized and there is vascular calcification. Soft tissue swelling of the heel, no bony destructive change to suggest osteomyelitis. IMPRESSION: 1. No findings of osteomyelitis. 2. Mildly increased periprosthetic lucency along the rod traversing ankle fusion and interlocking screw within the calcaneus probably  representing hardware loosening. Electronically Signed   By: Mitzi Hansen M.D.   On: 06/07/2017 03:45     Assessment/Plan: Adverse Drug Reaction  Neutropenic fever Group B strep bacteremia R pelvis osteo, abscess Discitis with abscess  R heel pressure ulcer (IV)   Total days of antibiotics: 2 dapto/cipro (end date 5-30)  Her total WBC is down, ANC pending.  Now afebrile BCx pending UCx polymicrobial (UA not consisitent with infection) Hopefully we can get her full course of anbx though would not be adverse to stopping early if her counts deem necessary If she remains afebrile, counts improve, will stop cipro If WBC continue to worsen, consider asking heme if GCSF reasonable.          Johny Sax MD, FACP Infectious Diseases (pager) [phone] [email or web address] 06/08/2017, 11:02 AM  LOS: 1 day

## 2017-06-09 LAB — IRON AND TIBC
Iron: 50 ug/dL (ref 28–170)
SATURATION RATIOS: 18 % (ref 10.4–31.8)
TIBC: 277 ug/dL (ref 250–450)
UIBC: 227 ug/dL

## 2017-06-09 LAB — RETICULOCYTES
RBC.: 3.74 MIL/uL — AB (ref 3.87–5.11)
RETIC CT PCT: 2.4 % (ref 0.4–3.1)
Retic Count, Absolute: 89.8 10*3/uL (ref 19.0–186.0)

## 2017-06-09 LAB — CBC WITH DIFFERENTIAL/PLATELET
BASOS PCT: 0 %
Basophils Absolute: 0 10*3/uL (ref 0.0–0.1)
EOS PCT: 0 %
Eosinophils Absolute: 0 10*3/uL (ref 0.0–0.7)
HEMATOCRIT: 26.6 % — AB (ref 36.0–46.0)
Hemoglobin: 8.4 g/dL — ABNORMAL LOW (ref 12.0–15.0)
LYMPHS ABS: 1.1 10*3/uL (ref 0.7–4.0)
Lymphocytes Relative: 48 %
MCH: 26.8 pg (ref 26.0–34.0)
MCHC: 31.6 g/dL (ref 30.0–36.0)
MCV: 85 fL (ref 78.0–100.0)
Monocytes Absolute: 1.1 10*3/uL — ABNORMAL HIGH (ref 0.1–1.0)
Monocytes Relative: 49 %
NEUTROS ABS: 0 10*3/uL — AB (ref 1.7–7.7)
Neutrophils Relative %: 2 %
OTHER: 1 %
Platelets: 538 10*3/uL — ABNORMAL HIGH (ref 150–400)
RBC: 3.13 MIL/uL — ABNORMAL LOW (ref 3.87–5.11)
RDW: 15.3 % (ref 11.5–15.5)
WBC: 2.2 10*3/uL — ABNORMAL LOW (ref 4.0–10.5)

## 2017-06-09 LAB — LACTATE DEHYDROGENASE: LDH: 146 U/L (ref 98–192)

## 2017-06-09 LAB — FOLATE: FOLATE: 12.6 ng/mL (ref >5.9)

## 2017-06-09 LAB — SAVE SMEAR

## 2017-06-09 LAB — FERRITIN: FERRITIN: 226 ng/mL (ref 11–307)

## 2017-06-09 LAB — PATHOLOGIST SMEAR REVIEW

## 2017-06-09 MED ORDER — HYDROCORTISONE NA SUCCINATE PF 100 MG IJ SOLR
25.0000 mg | Freq: Three times a day (TID) | INTRAMUSCULAR | Status: DC
  Administered 2017-06-09 – 2017-06-11 (×6): 25 mg via INTRAVENOUS
  Filled 2017-06-09 (×6): qty 2

## 2017-06-09 NOTE — Progress Notes (Signed)
[facility]    Date of Admission:  06/06/2017   Total days of antibiotics 4           ID: Andrea Yu is a 77 y.o. female with group B strep bacteremia/thoracic discitis, hx of pelvic abscess treated with vancomycin and ceftriaxone but had adverse SE of neutropenia. 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 Denies fever, states she is feeling better  Medications:  . aspirin EC  81 mg Oral QPM  . Chlorhexidine Gluconate Cloth  6 each Topical Q0600  . diltiazem  180 mg Oral QHS  . DULoxetine  90 mg Oral Daily  . enoxaparin (LOVENOX) injection  40 mg Subcutaneous Daily  . feeding supplement (PRO-STAT SUGAR FREE 64)  30 mL Oral BID  . furosemide  80 mg Oral Daily  . gabapentin  600 mg Oral Daily   And  . gabapentin  600 mg Oral QHS  . hydrocortisone sod succinate (SOLU-CORTEF) inj  25 mg Intravenous Q8H  . losartan  100 mg Oral Daily  . magnesium oxide  400 mg Oral QPM  . metoprolol succinate  50 mg Oral Daily  . multivitamin with minerals  1 tablet Oral Daily  . mupirocin ointment  1 application Nasal BID  . pantoprazole  40 mg Oral Daily  . potassium chloride SA  20 mEq Oral BID  . vitamin C  500 mg Oral BID    Objective: Vital signs in last 24 hours: Temp:  [97.5 F (36.4 C)-98.2 F (36.8 C)] 97.5 F (36.4 C) (05/16 1153) Pulse Rate:  [60-96] 96 (05/16 1153) Resp:  [18] 18 (05/15 2016) BP: (115-139)/(61-95) 115/76 (05/16 1153) SpO2:  [97 %-100 %] 100 % (05/16 1153) Weight:  [153 lb 10.6 oz (69.7 kg)] 153 lb 10.6 oz (69.7 kg) (05/16 0442) Physical Exam  Constitutional:  oriented to person, place, and time. appears stated  age and well-nourished. No distress.  HENT: [place]/AT, PERRLA, no scleral icterus Mouth/Throat: Oropharynx is clear and moist. No oropharyngeal exudate.  Cardiovascular: Normal rate, regular rhythm and normal heart sounds. Exam reveals no gallop and no friction rub.  No murmur heard.  Pulmonary/Chest: Effort normal and breath sounds normal. No respiratory distress.  has no wheezes.  Neck = supple, no nuchal rigidity Abdominal: Soft. Bowel sounds are normal.  exhibits no distension. There is no tenderness.  Lymphadenopathy: no cervical adenopathy. No axillary adenopathy Neurological: alert and oriented to person, place, and time.  Skin: Skin is warm and dry. No rash noted. No erythema.  Psychiatric: a normal mood and affect.  behavior is normal.    Lab Results Recent Labs    06/07/17 0604 06/08/17 0405 06/09/17 0422  WBC 2.8* 1.6* 2.2*  HGB 8.9* 8.3* 8.4*  HCT 27.6* 26.8* 26.6*  NA 140 139  --   K 3.0* 3.7  --   CL 109 106  --   CO2 19* 24  --   BUN 5* 12  --   CREATININE 0.54 0.61  --    Liver Panel Recent Labs    06/06/17 2245 06/07/17 0604  PROT 6.0* 5.3*  ALBUMIN 2.6* 2.3*  AST  14* 11*  ALT 11* 9*  ALKPHOS 147* 131*  BILITOT 0.8 0.8    Microbiology: 5/14 blood cx ngtd mrsa colonized Studies/Results: No results found.   Assessment/Plan: Neutropenia = thought to be prior exposure to vancomycin or ceftriaxone. Await to see if counts improve. ANC still 0 but her WBC is starting to trend up. No longer febrile  Group b strep discitis = continue on cipro and daptomycin.  pelvic abscess = continue with cipro and daptomycin til 5/30. Recommend to repeat pelvic CT to see if there is residual burden of infection  [facility] Cell: [phone] Pager: [phone]  06/09/2017, 2:28 PM

## 2017-06-09 NOTE — Progress Notes (Signed)
Diet changed from dysphagia to heart healthy per patient request.  She had been on dysphagia diet because her dentures were at home.  Dentures in now.

## 2017-06-09 NOTE — Clinical Social Work Note (Signed)
Clinical Social Work Assessment  Patient Details  Name: Andrea Yu MRN: [id number] Date of Birth: 1940/12/16  Date of referral:  06/09/17               Reason for consult:  Facility Placement, Discharge Planning                Permission sought to share information with:    Permission granted to share information::  No  Name::        Agency::     Relationship::     Contact Information:     Housing/Transportation Living arrangements for the past 2 months:  Single Family Home Source of Information:  Patient, Medical Team, Facility Patient Interpreter Needed:  None Criminal Activity/Legal Involvement Pertinent to Current Situation/Hospitalization:  No - Comment as needed Significant Relationships:  Adult Children, Spouse Lives with:  Spouse, Adult Children Do you feel safe going back to the place where you live?  Yes Need for family participation in patient care:  Yes (Comment)  Care giving concerns:  PT recommending SNF placement once medically stable for discharge.   Social Worker assessment / plan:  CSW met with patient. No supports at bedside. CSW introduced role and explained that PT recommendations would be discussed. Patient was recently discharged from [facility] and according to admissions coordinator has used 31 SNF days. CSW notified patient that she is in her copay days and does not have a secondary insurance to cover approximately $160 per day copay. Patient prefers to return home at discharge. She stated she has arranged for people to come in around 10:00 am (they are late sleepers) and help her and her husband get ready, prepare breakfast and lunch, and start dinner. Patient's daughter gets there around 5:30 pm and spends the night, helps with food, etc. Patient already has a walker, wheelchair, bedside commode, electric scooter shower seat, shower handlebars, and transport chair. She fixes the weeks medications for both her and her husband on Saturdays but  states her daughter is about to start doing this instead. Patient was set up with [facility] prior to admission. No further concerns. CSW signing off as social work intervention is no longer needed.  Employment status:  Retired Nurse, adult PT Recommendations:  [place] / Referral to community resources:  [place]  Patient/Family's Response to care:  Patient unable to pay daily copay for SNF so will return home at discharge. Patient's husband and daughter supportive and involved in patient's care. Patient appreciated social work intervention.  Patient/Family's Understanding of and Emotional Response to Diagnosis, Current Treatment, and Prognosis:  Patient has a good understanding of the reason for admission and plan to return home at discharge. Patient appears happy with hospital care.  Emotional Assessment Appearance:  Appears stated age Attitude/Demeanor/Rapport:  Engaged, Gracious Affect (typically observed):  Appropriate, Calm, Pleasant Orientation:  Oriented to Self, Oriented to Place, Oriented to  Time, Oriented to Situation Alcohol / Substance use:  Never Used Psych involvement (Current and /or in the community):  No (Comment)  Discharge Needs  Concerns to be addressed:  Care Coordination Readmission within the last 30 days:  No Current discharge risk:  Dependent with Mobility Barriers to Discharge:  Continued Medical Work up   Candie Chroman, LCSW 06/09/2017, 1:31 PM

## 2017-06-09 NOTE — Evaluation (Signed)
Physical Therapy Evaluation Patient Details Name: Andrea Yu MRN: [id number] DOB: 31-May-1940 Today's Date: 06/09/2017   History of Present Illness  77 y.o. female admitted 06/07/17 with fever, confusion, while blood cell count. 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 PMH includes: psoriatic arthritis, peripheral neuropathy, HTN, A-Fib, Back surgery, ankle fusion, lumbar fusion, B TKA,     Clinical Impression  Pt admitted with above diagnosis. Pt currently with functional limitations due to the deficits listed below (see PT Problem List). PTA, pt living with husband in 1 level home with ramped entrance. Patient has not ambulated in a year, pt reports she is able to transfer herself from bed to [facility] or W/C. Upon eval pt presents with generalized weakness, reporting she is weaker than baseline today. Supervision for bed mobility, mod A for sit<>stand in RW, but unable to ambulate or pivot without Max A. Patient sitting comfortably in bedside chair. Patient reports concerns for her husbands ability to continue to care for her, recommending SNF to decrease caregiver burden and increase independence with functional transfers, however patient may need to consider longer term options.   Pt will benefit from skilled PT to increase their independence and safety with mobility to allow discharge to the venue listed below.       Follow Up Recommendations SNF    Equipment Recommendations  None recommended by PT    Recommendations for Other Services OT consult     Precautions / Restrictions Precautions Precautions: Fall Restrictions Weight Bearing Restrictions: No      Mobility  Bed Mobility Overal bed mobility: Needs Assistance Bed Mobility: Supine to Sit     Supine to sit: Supervision        Transfers Overall transfer level: Needs assistance Equipment used: Rolling walker (2 wheeled) Transfers: Sit to/from [employer] Sit to Stand: Mod  assist Stand pivot transfers: Max assist       General transfer comment: Mod A to power up into RW, max A for assistance in transfer to bedside chair. patient unable to bear weight on RLE due to heel wound, and unable to lift LLE with hop gait, using heel to toe scooting ineffectivily  Ambulation/Gait             General Gait Details: unable due to weakness   Stairs            Wheelchair Mobility    Modified Rankin (Stroke Patients Only)       Balance Overall balance assessment: Needs assistance   Sitting balance-Leahy Scale: Fair       Standing balance-Leahy Scale: Poor                               Pertinent Vitals/Pain Pain Assessment: No/denies pain    Home Living Family/patient expects to be discharged to:: Private residence Living Arrangements: Spouse/significant other Available Help at Discharge: Family Type of Home: House Home Access: Ramped entrance     Home Layout: One level Home Equipment: Insurance underwriter - 2 wheels;Bedside commode;Tub bench;Wheelchair - manual;Grab bars - toilet;Grab bars - tub/shower      Prior Function Level of Independence: Needs assistance   Gait / Transfers Assistance Needed: hasnt walked in a year, transfers to commode with assistance   ADL's / Homemaking Assistance Needed: husband assists         Hand Dominance        Extremity/Trunk Assessment   Upper Extremity Assessment  Upper Extremity Assessment: Generalized weakness    Lower Extremity Assessment Lower Extremity Assessment: Generalized weakness(BLE 3+/5 )       Communication   Communication: HOH  Cognition Arousal/Alertness: Awake/alert Behavior During Therapy: WFL for tasks assessed/performed Overall Cognitive Status: Within Functional Limits for tasks assessed                                        General Comments      Exercises     Assessment/Plan    PT Assessment Patient needs continued PT  services  PT Problem List Decreased strength;Decreased range of motion;Decreased balance;Decreased activity tolerance;Decreased mobility;Decreased coordination;Decreased cognition;Obesity       PT Treatment Interventions DME instruction;Functional mobility training;Gait training;Therapeutic activities;Therapeutic exercise;Balance training    PT Goals (Current goals can be found in the Care Plan section)  Acute Rehab PT Goals Patient Stated Goal: rehab PT Goal Formulation: With patient Time For Goal Achievement: 06/16/17 Potential to Achieve Goals: Good    Frequency Min 2X/week   Barriers to discharge        Co-evaluation               AM-PAC PT "6 Clicks" Daily Activity  Outcome Measure Difficulty turning over in bed (including adjusting bedclothes, sheets and blankets)?: A Little Difficulty moving from lying on back to sitting on the side of the bed? : A Little Difficulty sitting down on and standing up from a chair with arms (e.g., wheelchair, bedside commode, etc,.)?: A Little Help needed moving to and from a bed to chair (including a wheelchair)?: A Lot Help needed walking in hospital room?: Total Help needed climbing 3-5 steps with a railing? : Total 6 Click Score: 13    End of Session Equipment Utilized During Treatment: Gait belt Activity Tolerance: Patient tolerated treatment well Patient left: in chair;with call bell/phone within reach;with chair alarm set Nurse Communication: Mobility status PT Visit Diagnosis: Unsteadiness on feet (R26.81);Repeated falls (R29.6);Muscle weakness (generalized) (M62.81)    Time: 2841-3244 PT Time Calculation (min) (ACUTE ONLY): 35 min   Charges:   PT Evaluation $PT Eval Low Complexity: 1 Low PT Treatments $Therapeutic Activity: 8-22 mins   PT G Codes:        Etta Grandchild, PT, DPT Acute Rehab Services Pager: [phone]    Etta Grandchild 06/09/2017, 9:23 AM

## 2017-06-09 NOTE — Progress Notes (Signed)
Nutrition Follow-up  DOCUMENTATION CODES:   Obesity unspecified  INTERVENTION:   -Continue MVI daily -Continue 30 ml Prostat BID, each supplement provides 100 kcals and 15 grams protein  NUTRITION DIAGNOSIS:   Increased nutrient needs related to wound healing as evidenced by estimated needs.  Ongoing  GOAL:   Patient will meet greater than or equal to 90% of their needs  Progressing  MONITOR:   PO intake, Supplement acceptance, Labs, Weight trends, Skin, I & O's  REASON FOR ASSESSMENT:   Low Braden    ASSESSMENT:   Andrea Yu is a 77 y.o. 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   Reviewed I/O's: -431 ml x 24 hours and -381 ml since admission.  Pt sitting up in recliner chair eating breakfast without difficulty. Per discussing with RN, intake has improved since admission. Noted meal completion 25-100%. Pt is accepting Prostat and MVI supplements well per RN.   Per MD notes, pt awaiting PT evaluation to determine discharge disposition.   Labs reviewed.   Diet Order:   Diet Order           DIET DYS 2 Room service appropriate? Yes; Fluid consistency: Thin; Fluid restriction: 1200 mL Fluid  Diet effective now          EDUCATION NEEDS:   Education needs have been addressed  Skin:  Skin Integrity Issues:: Incisions, Stage II Stage II: rt heel Incisions: rt buttocks  Last BM:  06/07/17  Height:   Ht Readings from Last 1 Encounters:  06/07/17 4\' 11"  (1.499 m)    Weight:   Wt Readings from Last 1 Encounters:  06/09/17 153 lb 10.6 oz (69.7 kg)    Ideal Body Weight:  44.5 kg  BMI:  Body mass index is 31.04 kg/m.  Estimated  Nutritional Needs:   Kcal:  1700-1900  Protein:  90-105 grams  Fluid:  > 1.7 L    Melquiades Kovar A. 06/11/17, RD, LDN, CDE Pager: [phone] After hours Pager: [phone]

## 2017-06-09 NOTE — Progress Notes (Signed)
PROGRESS NOTE        PATIENT DETAILS Name: Andrea Yu Age: 77 y.o. Sex: female Date of Birth: August 13, 1940 Admit Date: 06/06/2017 Admitting Physician Eduard Clos, MD ZOX:[id number], Reuel Boom, MD  Brief Narrative: Patient is a 77 y.o. female with history of PAF not on long-term anticoagulation, psoriatic arthritis on chronic steroids recent history of group B streptococcus bacteremia and T-spine discitis-on outpatient antimicrobial therapy with Rocephin which was then changed to vancomycin-presenting with confusion, fever and worsening back pain.  Subjective: Patient is completely awake and alert-sitting a chair at bedside.  Does not want to pursue an MRI at any cost even with Ativan.  She has no major complaints-denies any chest pain or shortness of breath.  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  All cultures so far negative.  Has been refusing an MRI for the past few days-no concerning neurological findings, she is aware that if her clinical situation changes then we may have no option but to pursue an MRI.  Await further input from infectious disease-remains on daptomycin and ciprofloxacin.   Acute metabolic encephalopathy: Solved.  Probably secondary to above.  Neutropenia: Thought to be secondary to antibiotic-likely vancomycin-continues to be neutropenic-vitamin B12 within normal limits, HIV serology negative.  Spoke with Dr. Arlana Hove on call-recommendations are to provide supportive care-to check anemia panel, LDH and a peripheral blood smear.  But she thinks that with time numbers should slowly improve.    Anemia: Suspect secondary to chronic disease-see above.  Thrombocytosis: Suspect reactive due to ongoing infection/inflammation-follow.  A. fib with RVR: Rate  controlled-continue Cardizem and metoprolol.  Reviewed prior notes-not a candidate for anticoagulation due to significant fall risk and prior history of bleeding   Hypertension: Controlled-continue Lasix, losartan and Cardizem.   Psoriatic arthritis: Stable-decrease hydrocortisone to 25 mg IV q. 8, if he continues to do well, will transition back to her usual dosing of prednisone starting tomorrow.    Chronic pain syndrome: Appears to be stable-continue Neurontin, Cymbalta and Robaxin.  She will claims that back pain has worsened-but now she seems to be comfortable-she has refused a MRI for the past few days.  See above.    Frequent falls: Claims has fallen twice in the past week or so-appreciate PT evaluation-if she agrees we will arrange for SNF on discharge, but I suspect she would rather go home with maximum home health services.  DVT Prophylaxis: Prophylactic Lovenox   Code Status: Full code  Family Communication: None at bedside-left message for patient's daughter  Disposition Plan: Remain inpatient-SNF versus home health services on discharge in the next couple days or so  Antimicrobial agents: Anti-infectives (From admission, onward)   Start     Dose/Rate Route Frequency Ordered Stop   06/08/17 0600  DAPTOmycin (CUBICIN) 420 mg in sodium chloride 0.9 % IVPB  Status:  Discontinued     420 mg 216.8 mL/hr over 30 Minutes Intravenous Every 24 hours 06/07/17 0249 06/07/17 1351   06/07/17 1800  ciprofloxacin (CIPRO) IVPB 400 mg     400 mg 200 mL/hr over 60 Minutes Intravenous 2 times daily 06/07/17 0249     06/07/17 0215  ciprofloxacin (CIPRO) IVPB 400 mg     400 mg 200 mL/hr over 60 Minutes Intravenous  Once 06/07/17 0203 06/07/17 0400   06/07/17 0215  DAPTOmycin (CUBICIN) 420 mg in sodium chloride 0.9 % IVPB     420 mg 216.8 mL/hr over 30 Minutes Intravenous Every 24 hours 06/07/17 0203        Procedures: None  CONSULTS:  ID  Time spent: 25- minutes-Greater than 50%  of this time was spent in counseling, explanation of diagnosis, planning of further management, and coordination of care.  MEDICATIONS: Scheduled Meds: . aspirin EC  81 mg Oral QPM  . Chlorhexidine Gluconate Cloth  6 each Topical Q0600  . diltiazem  180 mg Oral QHS  . DULoxetine  90 mg Oral Daily  . enoxaparin (LOVENOX) injection  40 mg Subcutaneous Daily  . feeding supplement (PRO-STAT SUGAR FREE 64)  30 mL Oral BID  . furosemide  80 mg Oral Daily  . gabapentin  600 mg Oral Daily   And  . gabapentin  600 mg Oral QHS  . hydrocortisone sod succinate (SOLU-CORTEF) inj  25 mg Intravenous Q8H  . losartan  100 mg Oral Daily  . magnesium oxide  400 mg Oral QPM  . metoprolol succinate  50 mg Oral Daily  . multivitamin with minerals  1 tablet Oral Daily  . mupirocin ointment  1 application Nasal BID  . pantoprazole  40 mg Oral Daily  . potassium chloride SA  20 mEq Oral BID  . vitamin C  500 mg Oral BID   Continuous Infusions: . ciprofloxacin Stopped (06/09/17 0646)  . DAPTOmycin (CUBICIN)  IV Stopped (06/09/17 0403)   PRN Meds:.acetaminophen **OR** acetaminophen, albuterol, HYDROcodone-acetaminophen, LORazepam, methocarbamol, ondansetron **OR** ondansetron (ZOFRAN) IV, polyvinyl alcohol, sodium chloride flush   PHYSICAL EXAM: Vital signs: Vitals:   06/08/17 2319 06/09/17 0442 06/09/17 1045 06/09/17 1153  BP: 129/73 139/68 122/61 115/76  Pulse: 78 76 79 96  Resp:      Temp:  98.2 F (36.8 C)  (!) 97.5 F (36.4 C)  TempSrc:  Oral  Oral  SpO2: 98% 97% 99% 100%  Weight:  69.7 kg (153 lb 10.6 oz)    Height:       Filed Weights   06/07/17 0344 06/08/17 0459 06/09/17 0442  Weight: 68.5 kg (151 lb) 66.5 kg (146 lb 9.7 oz) 69.7 kg (153 lb 10.6 oz)   Body mass index is 31.04 kg/m.   General appearance :Awake, alert, not in any distress.  HEENT: Atraumatic and Normocephalic Neck: supple, no JVD. Resp:Good air entry bilaterally, no rales or rhonchi CVS: S1 S2 regular, no  murmurs.  GI: Bowel sounds present, Non tender and not distended with no gaurding, rigidity or rebound. Extremities: B/L Lower Ext shows no edema, both legs are warm to touch Neurology:  speech clear,Non focal, sensation is grossly intact. Psychiatric: Normal judgment and insight. Normal mood. Musculoskeletal:No digital cyanosis Skin:No Rash, warm and dry Wounds:N/A  I have personally reviewed following labs and imaging studies  LABORATORY DATA: CBC: Recent Labs  Lab 06/06/17 2245 06/07/17 0604 06/08/17 0405 06/09/17 0422  WBC 1.9* 2.8* 1.6* 2.2*  NEUTROABS 0.0* 0.1* 0.1* 0.0*  HGB 10.3* 8.9* 8.3* 8.4*  HCT 32.3* 27.6* 26.8* 26.6*  MCV 84.8 83.9 84.5 85.0  PLT 541* 528* 540* 538*    Basic Metabolic Panel: Recent Labs  Lab 06/06/17 2245 06/07/17 0604 06/08/17 0405  NA 139 140 139  K 3.2* 3.0* 3.7  CL 106 109 106  CO2 19* 19* 24  GLUCOSE 91 73 126*  BUN 5* 5* 12  CREATININE 0.65  0.54 0.61  CALCIUM 8.7* 8.4* 8.6*    GFR: Estimated Creatinine Clearance: 49.2 mL/min (by C-G formula based on SCr of 0.61 mg/dL).  Liver Function Tests: Recent Labs  Lab 06/06/17 2245 06/07/17 0604  AST 14* 11*  ALT 11* 9*  ALKPHOS 147* 131*  BILITOT 0.8 0.8  PROT 6.0* 5.3*  ALBUMIN 2.6* 2.3*   No results for input(s): LIPASE, AMYLASE in the last 168 hours. No results for input(s): AMMONIA in the last 168 hours.  Coagulation Profile: Recent Labs  Lab 06/06/17 2245  INR 1.19    Cardiac Enzymes: Recent Labs  Lab 06/08/17 0405  CKTOTAL 13*    BNP (last 3 results) No results for input(s): PROBNP in the last 8760 hours.  HbA1C: No results for input(s): HGBA1C in the last 72 hours.  CBG: No results for input(s): GLUCAP in the last 168 hours.  Lipid Profile: No results for input(s): CHOL, HDL, LDLCALC, TRIG, CHOLHDL, LDLDIRECT in the last 72 hours.  Thyroid Function Tests: Recent Labs    06/07/17 0604  TSH 1.339    Anemia Panel: Recent Labs     06/08/17 0821  VITAMINB12 1,375*    Urine analysis:    Component Value Date/Time   COLORURINE YELLOW 06/06/2017 2245   APPEARANCEUR CLOUDY (A) 06/06/2017 2245   LABSPEC 1.018 06/06/2017 2245   PHURINE 6.0 06/06/2017 2245   GLUCOSEU NEGATIVE 06/06/2017 2245   HGBUR NEGATIVE 06/06/2017 2245   BILIRUBINUR NEGATIVE 06/06/2017 2245   KETONESUR 20 (A) 06/06/2017 2245   PROTEINUR 30 (A) 06/06/2017 2245   UROBILINOGEN 0.2 09/14/2014 1637   NITRITE NEGATIVE 06/06/2017 2245   LEUKOCYTESUR NEGATIVE 06/06/2017 2245    Sepsis Labs: Lactic Acid, Venous    Component Value Date/Time   LATICACIDVEN 1.53 06/06/2017 2251    MICROBIOLOGY: Recent Results (from the past 240 hour(s))  Culture, blood (Routine x 2)     Status: None (Preliminary result)   Collection Time: 06/07/17  1:05 AM  Result Value Ref Range Status   Specimen Description BLOOD LEFT FOREARM  Final   Special Requests   Final    BOTTLES DRAWN AEROBIC AND ANAEROBIC Blood Culture adequate volume   Culture   Final    NO GROWTH 2 DAYS Performed at [facility], [address], [place], [place] [address]    Report Status PENDING  Incomplete  Culture, blood (Routine x 2)     Status: None (Preliminary result)   Collection Time: 06/07/17  1:10 AM  Result Value Ref Range Status   Specimen Description BLOOD RIGHT WRIST  Final   Special Requests   Final    BOTTLES DRAWN AEROBIC AND ANAEROBIC Blood Culture adequate volume   Culture   Final    NO GROWTH 2 DAYS Performed at [facility], [address] [place], [place] [address]    Report Status PENDING  Incomplete  Urine culture     Status: Abnormal   Collection Time: 06/07/17  1:18 AM  Result Value Ref Range Status   Specimen Description URINE, CATHETERIZED  Final   Special Requests   Final    NONE Performed at [facility], [address] [place], [place] [address]    Culture MULTIPLE SPECIES PRESENT, SUGGEST RECOLLECTION (A)  Final   Report Status  06/08/2017 FINAL  Final  MRSA PCR Screening     Status: Abnormal   Collection Time: 06/07/17  9:56 AM  Result Value Ref Range Status   MRSA by  PCR POSITIVE (A) NEGATIVE Final    Comment:        The GeneXpert MRSA Assay (FDA approved for NASAL specimens only), is one component of a comprehensive MRSA colonization surveillance program. It is not intended to diagnose MRSA infection nor to guide or monitor treatment for MRSA infections. RESULT CALLED TO, READ BACK BY AND VERIFIED WITH: RN Zebedee Iba [phone] MLM Performed at [facility], [address] [place], [place] [address]     RADIOLOGY STUDIES/RESULTS: Dg Chest 2 View  Result Date: 06/06/2017 CLINICAL DATA:  77 y/o F; 1 week of back pain and confusion with fever. EXAM: CHEST - 2 VIEW COMPARISON:  05/04/2017 chest radiograph FINDINGS: Stable normal cardiac silhouette given projection and technique. Aortic atherosclerosis with calcification. Bronchitic changes in the left lung base. No focal consolidation. No pleural effusion or pneumothorax. 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 Right upper quadrant cholecystectomy clips. IMPRESSION: 1. Bronchitic changes in the left lung base. No focal consolidation. 2. Aortic atherosclerosis. Electronically Signed   By: Mitzi Hansen M.D.   On: 06/06/2017 23:36   Ct Head Wo Contrast  Result Date: 06/07/2017 CLINICAL DATA:  77 y/o F; fever and back pain. Altered mental status. EXAM: CT HEAD WITHOUT CONTRAST TECHNIQUE: Contiguous axial images were obtained from the base of the skull through the vertex without intravenous contrast. COMPARISON:  09/14/2014 CT head FINDINGS: Brain: No evidence of acute infarction, hemorrhage, hydrocephalus, extra-axial collection or mass lesion/mass effect. Stable chronic microvascular ischemic changes and parenchymal volume loss of the brain. Vascular: No hyperdense  vessel or unexpected calcification. Skull: Normal. Negative for fracture or focal lesion. Sinuses/Orbits: No acute finding. Other: None. IMPRESSION: 1. No acute intracranial abnormality identified. 2. Stable chronic microvascular ischemic changes and parenchymal volume loss of the brain. Electronically Signed   By: Mitzi Hansen M.D.   On: 06/07/2017 05:20   Dg Foot Complete Right  Result Date: 06/07/2017 CLINICAL DATA:  77 y/o F; wound on the bottom of the heel. History of ankle fusion 2017. EXAM: RIGHT FOOT COMPLETE - 3+ VIEW COMPARISON:  07/12/2016 right ankle radiograph FINDINGS: There is a rod fixing the ankle joint with a transversely oriented interlocking screw through the calcaneus. Mild increase in periprosthetic lucency surrounding the rod and interlocking screw within the calcaneus and lower tibia. Severe degenerative changes of the tibiotalar joint with flattening of talar dome. Pes planus. Bones are diffusely demineralized and there is vascular calcification. Soft tissue swelling of the heel, no bony destructive change to suggest osteomyelitis. IMPRESSION: 1. No findings of osteomyelitis. 2. Mildly increased periprosthetic lucency along the rod traversing ankle fusion and interlocking screw within the calcaneus probably representing hardware loosening. Electronically Signed   By: Mitzi Hansen M.D.   On: 06/07/2017 03:45     LOS: 2 days   Jeoffrey Massed, MD  [facility] Pager:[phone]  If 7PM-7AM, please contact night-coverage [email or web address] Password [facility] 06/09/2017, 12:04 PM

## 2017-06-09 NOTE — Progress Notes (Signed)
Physical Therapy Treatment Patient Details Name: Andrea Yu MRN: [id number] DOB: May 06, 1940 Today's Date: 06/09/2017    History of Present Illness 77 y.o. female admitted 06/07/17 with fever, confusion, while blood cell count. 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 PMH includes: psoriatic arthritis, peripheral neuropathy, HTN, A-Fib, Back surgery, ankle fusion, lumbar fusion, B TKA,     PT Comments    PT requested to assist patient back to bed from nursing staff. Patient requiring max A to return back to bed, at this time unable to stand and pivot successfully by herself. Per chart, appears SNF placement is of financial burden to patient and she wishes to return home and arrange more support. Patient is a falls risk and will benefit from family/caregiver education next PT visit. Updating recs and frequency to reflect HHPT to maximize safety and progression of functional mobility in home setting.    Follow Up Recommendations  Home health PT Supervision for all OOB mobility.      Equipment Recommendations  None recommended by PT    Recommendations for Other Services OT consult     Precautions / Restrictions Precautions Precautions: Fall Restrictions Weight Bearing Restrictions: No    Mobility  Bed Mobility Overal bed mobility: Needs Assistance Bed Mobility: Supine to Sit;Sit to Supine     Supine to sit: Supervision Sit to supine: Min guard      Transfers Overall transfer level: Needs assistance   Transfers: Squat Pivot Transfers   Stand pivot transfers: Max assist;+2 physical assistance       General transfer comment: Max A x2 utilizing pad of chair to assist patient back into bed.  Ambulation/Gait             General Gait Details: unable due to weakness    Stairs             Wheelchair Mobility    Modified Rankin (Stroke Patients Only)       Balance Overall balance assessment: Needs assistance   Sitting  balance-Leahy Scale: Fair       Standing balance-Leahy Scale: Poor                              Cognition Arousal/Alertness: Awake/alert Behavior During Therapy: WFL for tasks assessed/performed Overall Cognitive Status: Within Functional Limits for tasks assessed                                        Exercises      General Comments        Pertinent Vitals/Pain Pain Assessment: No/denies pain    Home Living                      Prior Function            PT Goals (current goals can now be found in the care plan section) Acute Rehab PT Goals Patient Stated Goal: rehab PT Goal Formulation: With patient Time For Goal Achievement: 06/16/17 Potential to Achieve Goals: Good    Frequency    Min 3X/week      PT Plan Current plan remains appropriate    Co-evaluation              AM-PAC PT "6 Clicks" Daily Activity  Outcome Measure  Difficulty turning over in bed (including adjusting bedclothes, sheets and  blankets)?: A Little Difficulty moving from lying on back to sitting on the side of the bed? : A Little Difficulty sitting down on and standing up from a chair with arms (e.g., wheelchair, bedside commode, etc,.)?: A Little Help needed moving to and from a bed to chair (including a wheelchair)?: A Lot Help needed walking in hospital room?: Total Help needed climbing 3-5 steps with a railing? : Total 6 Click Score: 13    End of Session Equipment Utilized During Treatment: Gait belt Activity Tolerance: Patient tolerated treatment well Patient left: in chair;with call bell/phone within reach;with chair alarm set Nurse Communication: Mobility status PT Visit Diagnosis: Unsteadiness on feet (R26.81);Repeated falls (R29.6);Muscle weakness (generalized) (M62.81)     Time: 2119-4174 PT Time Calculation (min) (ACUTE ONLY): 12 min  Charges:  $Therapeutic Activity: 8-22 mins                    G Codes:       Etta Grandchild, PT, DPT Acute Rehab Services Pager: [phone]    Etta Grandchild 06/09/2017, 8:57 PM

## 2017-06-10 ENCOUNTER — Inpatient Hospital Stay (HOSPITAL_COMMUNITY): Payer: Medicare Other

## 2017-06-10 LAB — CBC WITH DIFFERENTIAL/PLATELET
Basophils Absolute: 0 10*3/uL (ref 0.0–0.1)
Basophils Relative: 0 %
Eosinophils Absolute: 0 10*3/uL (ref 0.0–0.7)
Eosinophils Relative: 0 %
HCT: 26.1 % — ABNORMAL LOW (ref 36.0–46.0)
HEMOGLOBIN: 8.1 g/dL — AB (ref 12.0–15.0)
LYMPHS PCT: 48 %
Lymphs Abs: 1.2 10*3/uL (ref 0.7–4.0)
MCH: 26 pg (ref 26.0–34.0)
MCHC: 31 g/dL (ref 30.0–36.0)
MCV: 83.9 fL (ref 78.0–100.0)
MONOS PCT: 42 %
Monocytes Absolute: 1.1 10*3/uL — ABNORMAL HIGH (ref 0.1–1.0)
Neutro Abs: 0.3 10*3/uL — ABNORMAL LOW (ref 1.7–7.7)
Neutrophils Relative %: 10 %
PLATELETS: 546 10*3/uL — AB (ref 150–400)
RBC: 3.11 MIL/uL — AB (ref 3.87–5.11)
RDW: 15.3 % (ref 11.5–15.5)
WBC: 2.6 10*3/uL — AB (ref 4.0–10.5)

## 2017-06-10 LAB — PATHOLOGIST SMEAR REVIEW

## 2017-06-10 MED ORDER — IOHEXOL 300 MG/ML  SOLN
100.0000 mL | Freq: Once | INTRAMUSCULAR | Status: AC | PRN
  Administered 2017-06-10: 100 mL via INTRAVENOUS

## 2017-06-10 NOTE — Evaluation (Signed)
Occupational Therapy Evaluation Patient Details Name: Andrea Yu MRN: [id number] DOB: 1940/09/11 Today's Date: 06/10/2017    History of Present Illness 77 y.o. female admitted 06/07/17 with fever, confusion, while blood cell count. 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 PMH includes: psoriatic arthritis, peripheral neuropathy, HTN, A-Fib, Back surgery, ankle fusion, lumbar fusion, B TKA,    Clinical Impression   Pt admitted with above. She demonstrates the below listed deficits and will benefit from continued OT to maximize safety and independence with BADLs.  Pt presents to OT with generalized weakness, impaired balance, decreased activity tolerance, and poor safety awareness.  She currently requires max A +2 for stand pivot transfers, and is unsafe to attempt this at home due to high risk of falls.  She requires max -total A for LB ADLs.  Pt with poor safety awareness and poor ability to generalize current performance to home environment.  By end of eval, but began to acknowledge she is unsafe, but continues to insist on home discharge.  Will practice sliding board transfers next session.  Recommend SNF at discharge as she is very high risk for falls.       Follow Up Recommendations  SNF;Supervision/Assistance - 24 hour; however, pt likely will refuse SNF.  In that instance, recommend maxing out [facility] services.  HHOT, HHPT, HHRN, HHaid, HHSW.    Equipment Recommendations  Drop arm bedside commode     Recommendations for Other Services       Precautions / Restrictions Precautions Precautions: Fall Restrictions Weight Bearing Restrictions: No      Mobility Bed Mobility Overal bed mobility: Needs Assistance Bed Mobility: Supine to Sit;Sit to Supine     Supine to sit: Supervision        Transfers Overall transfer level: Needs assistance Equipment used: Rolling walker (2 wheeled)(Sliding Board) Transfers: Lateral/Scoot Transfers;Sit to/from  [employer] Sit to Stand: Min guard;Min assist Stand pivot transfers: Max assist;+2 physical assistance Squat pivot transfers: Min assist    Lateral/Scoot Transfers: Min assist General transfer comment: Extensive focus on transfer training this visit. Mulitple attempts at stand pivot transfers to [facility] and Chair. Patient is Min A for sit to stand, but unable to perform safe transfers without prematuraly sitting requring Max Ax2 to correct and avoid missing chair. Despite several cues for posture, safe use of RW and motor recrutiment, patient unable to perform safely. Transitioned to sliding board transfers where patient shows more promise, min A to min guard to help along and much safer. advised patient to utilize this method for now, and save standing and pivoting to times when she is with a PT/OT.     Balance Overall balance assessment: Needs assistance   Sitting balance-Leahy Scale: Fair       Standing balance-Leahy Scale: Poor                             ADL either performed or assessed with clinical judgement   ADL Overall ADL's : Needs assistance/impaired Eating/Feeding: Independent   Grooming: Wash/dry hands;Wash/dry face;Oral care;Brushing hair;Set up;Sitting   Upper Body Bathing: Set up;Supervision/ safety;Sitting   Lower Body Bathing: Maximal assistance;Sit to/from stand   Upper Body Dressing : Set up;Supervision/safety;Sitting   Lower Body Dressing: Maximal assistance;Sit to/from stand   Toilet Transfer: Maximal assistance;+2 for physical assistance;+2 for safety/equipment;Stand-pivot;BSC;RW   Toileting- Clothing Manipulation and Hygiene: Maximal assistance;Sit to/from stand Toileting - Clothing Manipulation Details (indicate cue type and reason):  unable to thoroughly perform peri care, and unable to maintain a clean environment while performing peri care - pt spread stool on multiple surfaces including hands with poor awareness       Functional mobility during ADLs: Maximal assistance;+2 for physical assistance;+2 for safety/equipment;Rolling walker       Vision         Perception     Praxis      Pertinent Vitals/Pain Pain Assessment: Faces Faces Pain Scale: Hurts even more Pain Location: BL Knees, R foot  Pain Descriptors / Indicators: Aching;Discomfort Pain Intervention(s): Monitored during session     Hand Dominance Left   Extremity/Trunk Assessment Upper Extremity Assessment Upper Extremity Assessment: Generalized weakness   Lower Extremity Assessment Lower Extremity Assessment: Defer to PT evaluation   Cervical / Trunk Assessment Cervical / Trunk Assessment: Kyphotic   Communication Communication Communication: HOH   Cognition Arousal/Alertness: Awake/alert Behavior During Therapy: WFL for tasks assessed/performed Overall Cognitive Status: No family/caregiver present to determine baseline cognitive functioning                                 General Comments: Pt with impaired judgement    General Comments       Exercises     Shoulder Instructions      Home Living Family/patient expects to be discharged to:: Private residence Living Arrangements: Spouse/significant other Available Help at Discharge: Family Type of Home: House Home Access: Ramped entrance     Home Layout: One level     Bathroom Shower/Tub: Chief Strategy Officer: Handicapped height Bathroom Accessibility: Yes How Accessible: Accessible via walker Home Equipment: Insurance underwriter - 2 wheels;Bedside commode;Tub bench;Wheelchair - manual;Grab bars - toilet;Grab bars - tub/shower;Other (comment)(sliding board )   Additional Comments: Pt reports she will have a group at church who will be in and out during the day, but are unable to physically assist her       Prior Functioning/Environment Level of Independence: Needs assistance  Gait / Transfers Assistance Needed: hasnt  walked in a year, transfers to commode with assistance  ADL's / Homemaking Assistance Needed: spouse assists her with LB ADLs             OT Problem List: Decreased strength;Decreased activity tolerance;Impaired balance (sitting and/or standing);Decreased cognition;Decreased safety awareness;Decreased knowledge of use of DME or AE;Pain      OT Treatment/Interventions: Self-care/ADL training;Therapeutic exercise;DME and/or AE instruction;Therapeutic activities;Cognitive remediation/compensation;Patient/family education;Balance training    OT Goals(Current goals can be found in the care plan section) Acute Rehab OT Goals Patient Stated Goal: to go home tomorrow.  And to regain full independence including walking  OT Goal Formulation: With patient Time For Goal Achievement: 06/24/17 Potential to Achieve Goals: Good ADL Goals Pt Will Perform Grooming: with mod assist;standing Pt Will Perform Lower Body Bathing: with mod assist;sit to/from stand;with adaptive equipment Pt Will Perform Lower Body Dressing: with mod assist;sit to/from stand;with adaptive equipment Pt Will Transfer to Toilet: with mod assist;stand pivot transfer;bedside commode Pt Will Perform Toileting - Clothing Manipulation and hygiene: with mod assist;sit to/from stand  OT Frequency: Min 2X/week   Barriers to D/C: Decreased caregiver support          Co-evaluation PT/OT/SLP Co-Evaluation/Treatment: Yes Reason for Co-Treatment: For patient/therapist safety;To address functional/ADL transfers PT goals addressed during session: Mobility/safety with mobility;Balance;Proper use of DME;Strengthening/ROM OT goals addressed during session: ADL's and self-care      AM-PAC  PT "6 Clicks" Daily Activity     Outcome Measure Help from another person eating meals?: None Help from another person taking care of personal grooming?: A Little Help from another person toileting, which includes using toliet, bedpan, or urinal?: A  Lot Help from another person bathing (including washing, rinsing, drying)?: A Lot Help from another person to put on and taking off regular upper body clothing?: A Little Help from another person to put on and taking off regular lower body clothing?: Total 6 Click Score: 15   End of Session Equipment Utilized During Treatment: Gait belt;Rolling walker;Other (comment)(sliding board ) Nurse Communication: Mobility status;Need for lift equipment  Activity Tolerance: Patient limited by fatigue Patient left: in chair;with call bell/phone within reach;with chair alarm set  OT Visit Diagnosis: Unsteadiness on feet (R26.81);Repeated falls (R29.6)                Time: 1829-9371 OT Time Calculation (min): 77 min Charges:  OT General Charges $OT Visit: 1 Visit OT Evaluation $OT Eval Moderate Complexity: 1 Mod OT Treatments $Self Care/Home Management : 8-22 mins $Therapeutic Activity: 8-22 mins G-Codes:     [employer], OTR/L [phone]   Jeani Hawking M 06/10/2017, 2:02 PM

## 2017-06-10 NOTE — Progress Notes (Signed)
Physical Therapy Treatment Patient Details Name: Andrea Yu MRN: [id number] DOB: 1940/02/05 Today's Date: 06/10/2017    History of Present Illness 77 y.o. female admitted 06/07/17 with fever, confusion, while blood cell count. 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 PMH includes: psoriatic arthritis, peripheral neuropathy, HTN, A-Fib, Back surgery, ankle fusion, lumbar fusion, B TKA,     PT Comments    Session focused extensively on transfer training (see General Transfer Comments section below). Patient is unsafe to attempt stand pivot transfers at this time. In depth discussion over safety and recommendations for sliding board transfers only at this time, with help. Patient agrees SNF would be best discharge dispo for her but is concerned about finances at this time. SNF is the safest option for patient to regain functional independence with transfers as she is not at baseline and weaker.  Will cont to work with patient on sliding board transfers next visit.    Follow Up Recommendations  SNF     Equipment Recommendations  None recommended by PT    Recommendations for Other Services       Precautions / Restrictions Precautions Precautions: Fall Restrictions Weight Bearing Restrictions: No    Mobility  Bed Mobility Overal bed mobility: Needs Assistance Bed Mobility: Supine to Sit;Sit to Supine     Supine to sit: Supervision        Transfers Overall transfer level: Needs assistance Equipment used: Rolling walker (2 wheeled)(Sliding Board) Transfers: Lateral/Scoot Transfers;Sit to/from [employer] Sit to Stand: Min guard;Min assist Stand pivot transfers: Max assist;+2 physical assistance Squat pivot transfers: Min assist    Lateral/Scoot Transfers: Min assist General transfer comment: Extensive focus on transfer training this visit. Mulitple attempts at stand pivot transfers to [facility] and Chair. Patient is Min A for sit to stand,  but unable to perform safe transfers without prematuraly sitting requring Max Ax2 to correct and avoid missing chair. Despite several cues for posture, safe use of RW and motor recrutiment, patient unable to perform safely. Transitioned to sliding board transfers where patient shows more promise, min A to min guard to help along and much safer. advised patient to utilize this method for now, and save standing and pivoting to times when she is with a PT/OT.   Ambulation/Gait             General Gait Details: unable due to weakness   Stairs             Wheelchair Mobility    Modified Rankin (Stroke Patients Only)       Balance Overall balance assessment: Needs assistance   Sitting balance-Leahy Scale: Fair       Standing balance-Leahy Scale: Poor                              Cognition Arousal/Alertness: Awake/alert Behavior During Therapy: WFL for tasks assessed/performed Overall Cognitive Status: Within Functional Limits for tasks assessed                                        Exercises      General Comments        Pertinent Vitals/Pain Pain Assessment: Faces Faces Pain Scale: Hurts even more Pain Location: BL Knees, R foot  Pain Descriptors / Indicators: Aching;Discomfort Pain Intervention(s): Limited activity within patient's tolerance;Premedicated before session;Monitored during session;Repositioned  Home Living                      Prior Function            PT Goals (current goals can now be found in the care plan section) Acute Rehab PT Goals PT Goal Formulation: With patient Time For Goal Achievement: 06/16/17 Potential to Achieve Goals: Good Progress towards PT goals: Progressing toward goals    Frequency    Min 3X/week      PT Plan Current plan remains appropriate    Co-evaluation PT/OT/SLP Co-Evaluation/Treatment: Yes Reason for Co-Treatment: For patient/therapist safety;To address  functional/ADL transfers PT goals addressed during session: Mobility/safety with mobility;Balance;Proper use of DME;Strengthening/ROM OT goals addressed during session: Proper use of Adaptive equipment and DME;ADL's and self-care;Strengthening/ROM      AM-PAC PT "6 Clicks" Daily Activity  Outcome Measure  Difficulty turning over in bed (including adjusting bedclothes, sheets and blankets)?: A Little Difficulty moving from lying on back to sitting on the side of the bed? : A Little Difficulty sitting down on and standing up from a chair with arms (e.g., wheelchair, bedside commode, etc,.)?: A Little Help needed moving to and from a bed to chair (including a wheelchair)?: A Lot Help needed walking in hospital room?: Total Help needed climbing 3-5 steps with a railing? : Total 6 Click Score: 13    End of Session Equipment Utilized During Treatment: Gait belt Activity Tolerance: Patient tolerated treatment well Patient left: in chair;with call bell/phone within reach;with chair alarm set Nurse Communication: Mobility status PT Visit Diagnosis: Unsteadiness on feet (R26.81);Repeated falls (R29.6);Muscle weakness (generalized) (M62.81)     Time: 0539-7673 PT Time Calculation (min) (ACUTE ONLY): 58 min  Charges:  $Therapeutic Activity: 23-37 mins                    G Codes:       Etta Grandchild, PT, DPT Acute Rehab Services Pager: [phone]    Etta Grandchild 06/10/2017, 1:37 PM

## 2017-06-10 NOTE — Progress Notes (Addendum)
[facility]    Date of Admission:  06/06/2017   Total days of antibiotics 5        Day 5 cipro        Day 5 daptomycin         ID: Andrea Yu is a 77 y.o. female with group b strep discitis and pelvic abscess c/b neutropenia from drug side effect. 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 Participating with physical therapy.   Ros: no rash, diarrhea, headache. 12 point ros is otherwise negative  Medications:  . aspirin EC  81 mg Oral QPM  . Chlorhexidine Gluconate Cloth  6 each Topical Q0600  . diltiazem  180 mg Oral QHS  . DULoxetine  90 mg Oral Daily  . enoxaparin (LOVENOX) injection  40 mg Subcutaneous Daily  . feeding supplement (PRO-STAT SUGAR FREE 64)  30 mL Oral BID  . furosemide  80 mg Oral Daily  . gabapentin  600 mg Oral Daily   And  . gabapentin  600 mg Oral QHS  . hydrocortisone sod succinate (SOLU-CORTEF) inj  25 mg Intravenous Q8H  . losartan  100 mg Oral Daily  . magnesium oxide  400 mg Oral QPM  . metoprolol succinate  50 mg Oral Daily  . multivitamin with minerals  1 tablet Oral Daily  . mupirocin ointment  1 application Nasal BID  . pantoprazole  40 mg Oral Daily  . potassium chloride SA  20 mEq Oral BID  . vitamin C  500 mg Oral BID    Objective: Vital signs in last 24 hours: Temp:  [98 F (36.7 C)-98.1 F (36.7 C)] 98.1 F (36.7 C) (05/17 0404) Pulse Rate:  [74-88] 88 (05/17 0404) Resp:  [12-16] 15 (05/17 0404) BP: (120-133)/(61-88) 120/88 (05/17 0404) SpO2:  [97 %-100 %] 97 % (05/17 0404) Weight:  [156 lb 12 oz (71.1 kg)] 156 lb 12 oz (71.1 kg) (05/17 0404) Physical Exam  Constitutional:  oriented to person, place, and time. appears well-developed and well-nourished. No  distress.  HENT: [place]/AT, PERRLA, no scleral icterus Mouth/Throat: Oropharynx is clear and moist. No oropharyngeal exudate.  Cardiovascular: Normal rate, regular rhythm and normal heart sounds. Exam reveals no gallop and no friction rub.  No murmur heard.  Pulmonary/Chest: Effort normal and breath sounds normal. No respiratory distress.  has no wheezes.  Neck = supple, no nuchal rigidity Abdominal: Soft. Bowel sounds are normal.  exhibits no distension. There is no tenderness.  Lymphadenopathy: no cervical adenopathy. No axillary adenopathy Neurological: alert and oriented to person, place, and time.  Skin: Skin is warm and dry. No rash noted. No erythema.  Psychiatric: a normal mood and affect.  behavior is normal.   Lab Results Recent Labs    06/08/17 0405 06/09/17 0422 06/10/17 0412  WBC 1.6* 2.2* 2.6*  HGB 8.3* 8.4* 8.1*  HCT 26.8* 26.6* 26.1*  NA 139  --   --   K 3.7  --   --   CL 106  --   --   CO2 24  --   --   BUN 12  --   --   CREATININE 0.61  --   --    Microbiology: 5/14 blood cx ngtd  Studies/Results: No results found.   Assessment/Plan: Neutropenia = improving. Her leukocyte up to 2.6 up from 2.2 and ANC is 300. Continue to monitor. Anticipate to steadily improve  Group b strep discitis and pelvic abscess/osteo = on cipro and daptomycin til 5/30. Will repeat pelvic CT to see if need to extend course  dehibilitation = continue to work with PT  Therapeutic drug monitoring = while on daptomycin. Continue to check weekly ck  Dr Ninetta Lights available for questions, will see her back on monday  [facility] Cell: [phone] Pager: [phone]  06/10/2017, 11:58 AM

## 2017-06-10 NOTE — Progress Notes (Signed)
Pharmacy Antibiotic Note  Andrea Yu is a 77 y.o. female admitted on 06/06/2017 with neutropenic fever. 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   Pharmacy has been consulted for Daptomycin and Ciprofloxacin dosing, now on day #4.  Afebrile, ANC improving. No bmet today, but renal function stable.  CK 13 on 5/15.   On chronic steriods, currently Hydrocortisone IV.  Plan:  Continue Daptomycin 420 mg IV q24hrs (~6 mg/kg/day)  Continue Cipro 400 mg IV q12hrs.  Follow renal function, ANC, culture data, clinical progress.   Weekly CK while on Daptomycin. Next due 5/22.  Planning antibiotics thru 06/23/17 (8 weeks)  Height: 4\' 11"  (149.9 cm) Weight: 156 lb 12 oz (71.1 kg) IBW/kg (Calculated) : 43.2  Temp (24hrs), Avg:97.9 F (36.6 C), Min:97.5 F (36.4 C), Max:98.1 F (36.7 C)  Recent Labs  Lab 06/06/17 2245 06/06/17 2251 06/07/17 0604 06/08/17 0405 06/09/17 0422 06/10/17 0412  WBC 1.9*  --  2.8* 1.6* 2.2* 2.6*  CREATININE 0.65  --  0.54 0.61  --   --   LATICACIDVEN  --  1.53  --   --   --   --     Estimated Creatinine Clearance: 49.8 mL/min (by C-G formula based on SCr of 0.61 mg/dL).    Allergies  Allergen Reactions  . Penicillins Anaphylaxis    THROAT SWELLING  Has patient had a PCN reaction causing immediate rash, facial/tongue/throat swelling, SOB or lightheadedness with hypotension:  # # YES # #  Has patient had a PCN reaction causing severe rash involving mucus membranes or skin necrosis: # # # YES # # # Has patient had a PCN reaction that required hospitalization:No Has patient had a PCN reaction occurring within the last 10 years:  # # YES # #  If all of the above answers are "NO", then may proceed with Cephalosporin use.   06/12/17 [Denosumab] Other (See Comments)    Severe Pain in the groin area.  . Ceftriaxone     Possible cause of neutropenia  . Fosamax  [Alendronate Sodium]     UNSPECIFIED REACTION     Antimicrobials this admission: Cipro 5/14>> Daptomycin 5/14>> CHG/Bactroban 5/14>>(5/18)  * prior CTX 4/6>>5/8 -> changed to Vancomycin per outpt ID note>stopped on admit  Dose adjustments this admission:  n/a  Microbiology results:  5/14 BCx: no growth x 3 days to date  5/14 urine: mult species  5/14 MRSA PCR positive   *Prior cultures:    3/24 blood: Group B Strep     3/24 urine: > 80K E coli   Thank you for allowing pharmacy to be a part of this patient's care.  4/24, Dennie Fetters Pager: [phone] or [phone] 06/10/2017 11:14 AM

## 2017-06-10 NOTE — Progress Notes (Signed)
PROGRESS NOTE        PATIENT DETAILS Name: Andrea Yu Age: 77 y.o. Sex: female Date of Birth: [birth year] Admit Date: 06/06/2017 Admitting Physician Eduard Clos, MD [id number], Reuel Boom, MD  Brief Narrative: Patient is a 77 y.o. female with history of PAF not on long-term anticoagulation, psoriatic arthritis on chronic steroids recent history of group B streptococcus bacteremia and T-spine discitis-on outpatient antimicrobial therapy with Rocephin which was then changed to vancomycin-presenting with confusion, fever and worsening back pain.  Subjective: Awake and alert. No chest pain or SOB.   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 Has refused a MRI of her back, await CT abd/pelvis to assess prior pelvic abscess. Neutropenia slightly better. ID following, remains on Dapto/Cipro.  Acute metabolic encephalopathy: Likely 2/2 above, resolved.   Neutropenia: Likely 2/2 Vanco/Rocephin-Spoke with Dr Feng-Hematology on 5/16-recommended supportive care-thought that counts should improve with time. Vit B12, Folate, LDH within normal limits. Continue to follow for now.    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 Follow Hb  Thrombocytosis: Suspect reactive due to ongoing infection/inflammation-follow.  A. fib with RVR: Rate controlled-continue with cardizem and Metoprolol.  Reviewed prior notes-not a candidate for anticoagulation due to significant fall risk and prior history of bleeding   Hypertension: Controlled-continue Lasix, losartan and Cardizem.   Psoriatic arthritis: Stable-initially on stress dose hydrocortisone-will transition back to prednisone today  Chronic pain syndrome: Appears to be stable-continue Neurontin,  Cymbalta and Robaxin.  She will claims that back pain has worsened-but now she seems to be comfortable-she has refused a MRI for the past few days.  See above.    Frequent falls: Claims has fallen twice in the past week or so-appreciate PT evaluation-refuses SNF-will arrange for maximal home health services on discharge  DVT Prophylaxis: Prophylactic Lovenox   Code Status: Full code  Family Communication: None at bedside  Disposition Plan: Remain inpatient-home with HH services on discharge-likely over the weekend. Antimicrobial agents: Anti-infectives (From admission, onward)   Start     Dose/Rate Route Frequency Ordered Stop   06/08/17 0600  DAPTOmycin (CUBICIN) 420 mg in sodium chloride 0.9 % IVPB  Status:  Discontinued     420 mg 216.8 mL/hr over 30 Minutes Intravenous Every 24 hours 06/07/17 0249 06/07/17 1351   06/07/17 1800  ciprofloxacin (CIPRO) IVPB 400 mg     400 mg 200 mL/hr over 60 Minutes Intravenous 2 times daily 06/07/17 0249     06/07/17 0215  ciprofloxacin (CIPRO) IVPB 400 mg     400 mg 200 mL/hr over 60 Minutes Intravenous  Once 06/07/17 0203 06/07/17 0400   06/07/17 0215  DAPTOmycin (CUBICIN) 420 mg in sodium chloride 0.9 % IVPB     420 mg 216.8 mL/hr over 30 Minutes Intravenous Every 24 hours 06/07/17 0203        Procedures: None  CONSULTS:  ID  Time spent: 25- minutes-Greater than 50% of this time was spent in counseling, explanation of diagnosis, planning of further management, and coordination of care.  MEDICATIONS: Scheduled Meds: . aspirin EC  81 mg Oral QPM  . Chlorhexidine Gluconate Cloth  6 each Topical Q0600  . diltiazem  180 mg Oral QHS  . DULoxetine  90 mg Oral Daily  . enoxaparin (LOVENOX) injection  40 mg Subcutaneous Daily  . feeding supplement (PRO-STAT SUGAR FREE 64)  30 mL Oral BID  . furosemide  80 mg Oral Daily  . gabapentin  600 mg Oral Daily   And  . gabapentin  600 mg Oral QHS  . hydrocortisone sod succinate (SOLU-CORTEF)  inj  25 mg Intravenous Q8H  . losartan  100 mg Oral Daily  . magnesium oxide  400 mg Oral QPM  . metoprolol succinate  50 mg Oral Daily  . multivitamin with minerals  1 tablet Oral Daily  . mupirocin ointment  1 application Nasal BID  . pantoprazole  40 mg Oral Daily  . potassium chloride SA  20 mEq Oral BID  . vitamin C  500 mg Oral BID   Continuous Infusions: . ciprofloxacin Stopped (06/10/17 0549)  . DAPTOmycin (CUBICIN)  IV Stopped (06/10/17 0505)   PRN Meds:.acetaminophen **OR** acetaminophen, albuterol, HYDROcodone-acetaminophen, LORazepam, methocarbamol, ondansetron **OR** ondansetron (ZOFRAN) IV, polyvinyl alcohol, sodium chloride flush   PHYSICAL EXAM: Vital signs: Vitals:   06/09/17 1153 06/09/17 2029 06/09/17 2359 06/10/17 0404  BP: 115/76 133/61 127/69 120/88  Pulse: 96 81 74 88  Resp:  16 12 15   Temp: (!) 97.5 F (36.4 C) 98 F (36.7 C) 98.1 F (36.7 C) 98.1 F (36.7 C)  TempSrc: Oral Oral Oral Oral  SpO2: 100% 100% 100% 97%  Weight:    71.1 kg (156 lb 12 oz)  Height:       Filed Weights   06/08/17 0459 06/09/17 0442 06/10/17 0404  Weight: 66.5 kg (146 lb 9.7 oz) 69.7 kg (153 lb 10.6 oz) 71.1 kg (156 lb 12 oz)   Body mass index is 31.66 kg/m.   General appearance:Awake, alert, not in any distress. Chronically sick appearing.  Eyes:no scleral icterus. HEENT: Atraumatic and Normocephalic Neck: supple, no JVD. Resp:Good air entry bilaterally,no rales or rhonchi CVS: S1 S2 regular, no murmurs.  GI: Bowel sounds present, Non tender and not distended with no gaurding, rigidity or rebound. Extremities: B/L Lower Ext shows no edema, both legs are warm to touch Neurology:  Non focal Psychiatric: Normal judgment and insight. Normal mood. Musculoskeletal:No digital cyanosis Skin:No Rash, warm and dry Wounds:N/A  I have personally reviewed following labs and imaging studies  LABORATORY DATA: CBC: Recent Labs  Lab 06/06/17 2245 06/07/17 0604  06/08/17 0405 06/09/17 0422 06/10/17 0412  WBC 1.9* 2.8* 1.6* 2.2* 2.6*  NEUTROABS 0.0* 0.1* 0.1* 0.0* 0.3*  HGB 10.3* 8.9* 8.3* 8.4* 8.1*  HCT 32.3* 27.6* 26.8* 26.6* 26.1*  MCV 84.8 83.9 84.5 85.0 83.9  PLT 541* 528* 540* 538* 546*    Basic Metabolic Panel: Recent Labs  Lab 06/06/17 2245 06/07/17 0604 06/08/17 0405  NA 139 140 139  K 3.2* 3.0* 3.7  CL 106 109 106  CO2 19* 19* 24  GLUCOSE 91 73 126*  BUN 5* 5* 12  CREATININE 0.65 0.54 0.61  CALCIUM 8.7* 8.4* 8.6*    GFR: Estimated Creatinine Clearance: 49.8 mL/min (by C-G formula based on SCr of 0.61 mg/dL).  Liver Function Tests: Recent Labs  Lab 06/06/17 2245 06/07/17 0604  AST 14* 11*  ALT 11* 9*  ALKPHOS 147* 131*  BILITOT 0.8 0.8  PROT 6.0* 5.3*  ALBUMIN 2.6* 2.3*   No results for input(s): LIPASE, AMYLASE in the last 168 hours. No results for input(s): AMMONIA in the last 168 hours.  Coagulation Profile: Recent Labs  Lab 06/06/17 2245  INR 1.19    Cardiac Enzymes: Recent Labs  Lab 06/08/17 0405  CKTOTAL 13*    BNP (last 3 results) No results for input(s): PROBNP in the last 8760 hours.  HbA1C: No results for input(s): HGBA1C in the last 72 hours.  CBG: No results for input(s): GLUCAP in the last 168 hours.  Lipid Profile: No results for input(s): CHOL, HDL, LDLCALC, TRIG, CHOLHDL, LDLDIRECT in the last 72 hours.  Thyroid Function Tests: No results for input(s): TSH, T4TOTAL, FREET4, T3FREE, THYROIDAB in the last 72 hours.  Anemia Panel: Recent Labs    06/08/17 0821 06/09/17 1228  VITAMINB12 1,375*  --   FOLATE  --  12.6  FERRITIN  --  226  TIBC  --  277  IRON  --  50  RETICCTPCT  --  2.4    Urine analysis:    Component Value Date/Time   COLORURINE YELLOW 06/06/2017 2245   APPEARANCEUR CLOUDY (A) 06/06/2017 2245   LABSPEC 1.018 06/06/2017 2245   PHURINE 6.0 06/06/2017 2245   GLUCOSEU NEGATIVE 06/06/2017 2245   HGBUR NEGATIVE 06/06/2017 2245   BILIRUBINUR NEGATIVE  06/06/2017 2245   KETONESUR 20 (A) 06/06/2017 2245   PROTEINUR 30 (A) 06/06/2017 2245   UROBILINOGEN 0.2 09/14/2014 1637   NITRITE NEGATIVE 06/06/2017 2245   LEUKOCYTESUR NEGATIVE 06/06/2017 2245    Sepsis Labs: Lactic Acid, Venous    Component Value Date/Time   LATICACIDVEN 1.53 06/06/2017 2251    MICROBIOLOGY: Recent Results (from the past 240 hour(s))  Culture, blood (Routine x 2)     Status: None (Preliminary result)   Collection Time: 06/07/17  1:05 AM  Result Value Ref Range Status   Specimen Description BLOOD LEFT FOREARM  Final   Special Requests   Final    BOTTLES DRAWN AEROBIC AND ANAEROBIC Blood Culture adequate volume   Culture   Final    NO GROWTH 3 DAYS Performed at [facility], [address], [place], [place] [address]    Report Status PENDING  Incomplete  Culture, blood (Routine x 2)     Status: None (Preliminary result)   Collection Time: 06/07/17  1:10 AM  Result Value Ref Range Status   Specimen Description BLOOD RIGHT WRIST  Final   Special Requests   Final    BOTTLES DRAWN AEROBIC AND ANAEROBIC Blood Culture adequate volume   Culture   Final    NO GROWTH 3 DAYS Performed at [facility], [address] [place], [place] [address]    Report Status PENDING  Incomplete  Urine culture     Status: Abnormal   Collection Time: 06/07/17  1:18 AM  Result Value Ref Range Status   Specimen Description URINE, CATHETERIZED  Final   Special Requests   Final    NONE Performed at [facility], [address], [place], [place] [address]    Culture MULTIPLE SPECIES PRESENT, SUGGEST RECOLLECTION (A)  Final   Report Status 06/08/2017 FINAL  Final  MRSA PCR Screening     Status: Abnormal   Collection Time: 06/07/17  9:56 AM  Result Value Ref Range Status   MRSA by PCR POSITIVE (A) NEGATIVE Final    Comment:        The GeneXpert MRSA Assay (FDA approved for NASAL specimens only), is one component of a comprehensive MRSA  colonization surveillance program. It is not intended to diagnose MRSA infection nor to guide or monitor treatment for MRSA infections. RESULT CALLED TO, READ  BACK BY AND VERIFIED WITH: RN Zebedee Iba [id number] MLM Performed at [facility], [address], [place], [place] [address]     RADIOLOGY STUDIES/RESULTS: Dg Chest 2 View  Result Date: 06/06/2017 CLINICAL DATA:  77 y/o F; 1 week of back pain and confusion with fever. EXAM: CHEST - 2 VIEW COMPARISON:  05/04/2017 chest radiograph FINDINGS: Stable normal cardiac silhouette given projection and technique. Aortic atherosclerosis with calcification. Bronchitic changes in the left lung base. No focal consolidation. No pleural effusion or pneumothorax. 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 Right upper quadrant cholecystectomy clips. IMPRESSION: 1. Bronchitic changes in the left lung base. No focal consolidation. 2. Aortic atherosclerosis. Electronically Signed   By: Mitzi Hansen M.D.   On: 06/06/2017 23:36   Ct Head Wo Contrast  Result Date: 06/07/2017 CLINICAL DATA:  77 y/o F; fever and back pain. Altered mental status. EXAM: CT HEAD WITHOUT CONTRAST TECHNIQUE: Contiguous axial images were obtained from the base of the skull through the vertex without intravenous contrast. COMPARISON:  09/14/2014 CT head FINDINGS: Brain: No evidence of acute infarction, hemorrhage, hydrocephalus, extra-axial collection or mass lesion/mass effect. Stable chronic microvascular ischemic changes and parenchymal volume loss of the brain. Vascular: No hyperdense vessel or unexpected calcification. Skull: Normal. Negative for fracture or focal lesion. Sinuses/Orbits: No acute finding. Other: None. IMPRESSION: 1. No acute intracranial abnormality identified. 2. Stable chronic microvascular ischemic changes and parenchymal volume loss of the brain. Electronically  Signed   By: Mitzi Hansen M.D.   On: 06/07/2017 05:20   Dg Foot Complete Right  Result Date: 06/07/2017 CLINICAL DATA:  77 y/o F; wound on the bottom of the heel. History of ankle fusion 2017. EXAM: RIGHT FOOT COMPLETE - 3+ VIEW COMPARISON:  07/12/2016 right ankle radiograph FINDINGS: There is a rod fixing the ankle joint with a transversely oriented interlocking screw through the calcaneus. Mild increase in periprosthetic lucency surrounding the rod and interlocking screw within the calcaneus and lower tibia. Severe degenerative changes of the tibiotalar joint with flattening of talar dome. Pes planus. Bones are diffusely demineralized and there is vascular calcification. Soft tissue swelling of the heel, no bony destructive change to suggest osteomyelitis. IMPRESSION: 1. No findings of osteomyelitis. 2. Mildly increased periprosthetic lucency along the rod traversing ankle fusion and interlocking screw within the calcaneus probably representing hardware loosening. Electronically Signed   By: Mitzi Hansen M.D.   On: 06/07/2017 03:45     LOS: 3 days   Jeoffrey Massed, MD  [facility] Pager:[phone]  If 7PM-7AM, please contact night-coverage [email or web address] Password [email or web address] 06/10/2017, 11:50 AM

## 2017-06-10 NOTE — Progress Notes (Signed)
Double checked with CT scan that about her schedule CT abdomen, it will happen this evening but they are not sure about exact timing, they said it's ok to go ahead and take dinner then they will call after some hours, will follow up  Lonia Farber, RN

## 2017-06-11 LAB — CBC WITH DIFFERENTIAL/PLATELET
BAND NEUTROPHILS: 0 %
BASOS ABS: 0 10*3/uL (ref 0.0–0.1)
Basophils Relative: 0 %
Blasts: 0 %
EOS ABS: 0 10*3/uL (ref 0.0–0.7)
Eosinophils Relative: 0 %
HCT: 29.7 % — ABNORMAL LOW (ref 36.0–46.0)
HEMOGLOBIN: 9.2 g/dL — AB (ref 12.0–15.0)
Lymphocytes Relative: 62 %
Lymphs Abs: 2.2 10*3/uL (ref 0.7–4.0)
MCH: 26.1 pg (ref 26.0–34.0)
MCHC: 31 g/dL (ref 30.0–36.0)
MCV: 84.4 fL (ref 78.0–100.0)
METAMYELOCYTES PCT: 0 %
MONOS PCT: 18 %
Monocytes Absolute: 0.6 10*3/uL (ref 0.1–1.0)
Myelocytes: 0 %
NEUTROS ABS: 0.7 10*3/uL — AB (ref 1.7–7.7)
Neutrophils Relative %: 20 %
Other: 0 %
PROMYELOCYTES RELATIVE: 0 %
Platelets: 553 10*3/uL — ABNORMAL HIGH (ref 150–400)
RBC: 3.52 MIL/uL — ABNORMAL LOW (ref 3.87–5.11)
RDW: 15.4 % (ref 11.5–15.5)
WBC: 3.5 10*3/uL — AB (ref 4.0–10.5)
nRBC: 2 /100 WBC — ABNORMAL HIGH

## 2017-06-11 LAB — BASIC METABOLIC PANEL
Anion gap: 10 (ref 5–15)
BUN: 20 mg/dL (ref 6–20)
CALCIUM: 8.7 mg/dL — AB (ref 8.9–10.3)
CO2: 27 mmol/L (ref 22–32)
Chloride: 105 mmol/L (ref 101–111)
Creatinine, Ser: 0.84 mg/dL (ref 0.44–1.00)
GFR calc Af Amer: >60 mL/min (ref >60)
GLUCOSE: 111 mg/dL — AB (ref 65–99)
POTASSIUM: 3.2 mmol/L — AB (ref 3.5–5.1)
SODIUM: 142 mmol/L (ref 135–145)

## 2017-06-11 MED ORDER — PREDNISONE 10 MG PO TABS
10.0000 mg | ORAL_TABLET | Freq: Every day | ORAL | Status: DC
  Administered 2017-06-11: 10 mg via ORAL
  Filled 2017-06-11: qty 1

## 2017-06-11 MED ORDER — POTASSIUM CHLORIDE CRYS ER 20 MEQ PO TBCR
40.0000 meq | EXTENDED_RELEASE_TABLET | Freq: Once | ORAL | Status: AC
  Administered 2017-06-11: 40 meq via ORAL
  Filled 2017-06-11: qty 2

## 2017-06-11 MED ORDER — PREDNISONE 5 MG PO TABS: 5.0000 mg | ORAL_TABLET | Freq: Every day | ORAL | Status: DC

## 2017-06-11 MED ORDER — PREDNISONE 10 MG PO TABS
10.0000 mg | ORAL_TABLET | Freq: Every day | ORAL | Status: DC
Start: 2017-06-11 — End: 2017-06-11

## 2017-06-11 NOTE — Progress Notes (Signed)
Physical Therapy Treatment Patient Details Name: Andrea Yu MRN: [id number] DOB: 11/27/1940 Today's Date: 06/11/2017    History of Present Illness 77 y.o. female admitted 06/07/17 with fever, confusion, while blood cell count. 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 PMH includes: psoriatic arthritis, peripheral neuropathy, HTN, A-Fib, Back surgery, ankle fusion, lumbar fusion, B TKA,     PT Comments    Session focused on transfers utilizing sliding board. At this time pt unable to safely position board on her own. Feel this may be improve with drop arm commode seat and will attempt next visit. Pt tires quickly with activity during peri care and requires hands on assistance for safe transfers at this time. Pt emotional at times realizing she needs 24/7 assistance. Will continue to progress as able.    Follow Up Recommendations  SNF     Equipment Recommendations  None recommended by PT    Recommendations for Other Services OT consult     Precautions / Restrictions Precautions Precautions: Fall Restrictions Weight Bearing Restrictions: No    Mobility  Bed Mobility Overal bed mobility: Needs Assistance Bed Mobility: Supine to Sit;Sit to Supine     Supine to sit: Supervision Sit to supine: Min guard      Transfers Overall transfer level: Needs assistance Equipment used: Rolling walker (2 wheeled)(Sliding Board) Transfers: Lateral/Scoot Transfers;Sit to/from [employer] Sit to Stand: Min assist;Min guard        Lateral/Scoot Transfers: Mod assist General transfer comment: Session focused on transfers. Pt sit to stand x5 this session for pericare, however fatigued. Issues with sliding board transfers today as we are not utilizng drop arm commodes, patient requires hand on assistance with sliding board transfers to ensure safety.  Ambulation/Gait                 Stairs             Wheelchair Mobility     Modified Rankin (Stroke Patients Only)       Balance Overall balance assessment: Needs assistance   Sitting balance-Leahy Scale: Fair       Standing balance-Leahy Scale: Poor                              Cognition Arousal/Alertness: Awake/alert Behavior During Therapy: WFL for tasks assessed/performed Overall Cognitive Status: No family/caregiver present to determine baseline cognitive functioning                                 General Comments: Pt with impaired judgement       Exercises      General Comments        Pertinent Vitals/Pain Pain Assessment: Faces Faces Pain Scale: Hurts even more Pain Location: BL Knees, R foot  Pain Descriptors / Indicators: Aching;Discomfort    Home Living                      Prior Function            PT Goals (current goals can now be found in the care plan section) Acute Rehab PT Goals Patient Stated Goal: to go home tomorrow.  And to regain full independence including walking  PT Goal Formulation: With patient Time For Goal Achievement: 06/16/17 Potential to Achieve Goals: Good    Frequency    Min 3X/week  PT Plan Current plan remains appropriate    Co-evaluation PT/OT/SLP Co-Evaluation/Treatment: Yes Reason for Co-Treatment: For patient/therapist safety;To address functional/ADL transfers          AM-PAC PT "6 Clicks" Daily Activity  Outcome Measure  Difficulty turning over in bed (including adjusting bedclothes, sheets and blankets)?: A Little Difficulty moving from lying on back to sitting on the side of the bed? : A Little Difficulty sitting down on and standing up from a chair with arms (e.g., wheelchair, bedside commode, etc,.)?: A Little Help needed moving to and from a bed to chair (including a wheelchair)?: A Lot Help needed walking in hospital room?: Total Help needed climbing 3-5 steps with a railing? : Total 6 Click Score: 13    End of Session  Equipment Utilized During Treatment: Gait belt Activity Tolerance: Patient tolerated treatment well Patient left: with call bell/phone within reach;with chair alarm set;in bed Nurse Communication: Mobility status PT Visit Diagnosis: Unsteadiness on feet (R26.81);Repeated falls (R29.6);Muscle weakness (generalized) (M62.81)     Time: 1100-1208(increased time with tolieting ) PT Time Calculation (min) (ACUTE ONLY): 68 min  Charges:  $Therapeutic Activity: 8-22 mins                    G Codes:       Etta Grandchild, PT, DPT Acute Rehab Services Pager: [phone]     Etta Grandchild 06/11/2017, 12:12 PM

## 2017-06-11 NOTE — Progress Notes (Signed)
Occupational Therapy Treatment Patient Details Name: Andrea Yu MRN: [id number] DOB: [birth year] Today's Date: 06/11/2017    History of present illness 77 y.o. female admitted 06/07/17 with fever, confusion, while blood cell count. 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 PMH includes: psoriatic arthritis, peripheral neuropathy, HTN, A-Fib, Back surgery, ankle fusion, lumbar fusion, B TKA,    OT comments  Pt seen with PT.  Focused on problem solving through safe transfer options for home.  Pt able to use sliding board bed > BSC with min A, but required mod A BSC > bed and required therapists' intervention to prevent fall.   Pt incontinent of stool, and required max A for clothing manipulation and peri care.  Pt does not have this level of care available at discharge, and today she is able to acknowledge this and voices concern about this, but continues to insist she can't afford SNF or 24 hour care.   She is a very high risk for falls, and continue to recommend SNF as optimal discharge plan.   Follow Up Recommendations  SNF;Supervision/Assistance - 24 hour - Pt will likely confuse and will need maximum HH services (HHOT, HHPT, HHaide, HHRN, HHSW)   Equipment Recommendations  Other (comment)(drop arm bedside commode )    Recommendations for Other Services      Precautions / Restrictions Precautions Precautions: Fall Restrictions Weight Bearing Restrictions: No       Mobility Bed Mobility Overal bed mobility: Needs Assistance Bed Mobility: Supine to Sit;Sit to Supine     Supine to sit: Supervision Sit to supine: Min guard      Transfers Overall transfer level: Needs assistance Equipment used: Rolling walker (2 wheeled)(Sliding Board) Transfers: Lateral/Scoot Transfers;Sit to/from [employer] Sit to Stand: Min assist;Min guard        Lateral/Scoot Transfers: Mod assist General transfer comment: Session focused on transfers. Pt sit to  stand x5 this session for pericare, however fatigued. Issues with sliding board transfers today as we are not utilizng drop arm commodes, patient requires hand on assistance with sliding board transfers to ensure safety.    Balance Overall balance assessment: Needs assistance Sitting-balance support: Feet supported;Single extremity supported Sitting balance-Leahy Scale: Fair     Standing balance support: During functional activity;Bilateral upper extremity supported Standing balance-Leahy Scale: Poor                             ADL either performed or assessed with clinical judgement   ADL Overall ADL's : Needs assistance/impaired                         Toilet Transfer: Minimal assistance;+2 for physical assistance;+2 for safety/equipment;Transfer board;BSC;Moderate assistance Toilet Transfer Details (indicate cue type and reason): Pt able to use slding board from bed to [facility] with min A, but required max A +2 to return to bed as pt with significant difficulty placing board and sliding on it - slid forward  requiring therapists' intervention to prevent fall  Toileting- Clothing Manipulation and Hygiene: Maximal assistance;Sit to/from stand Toileting - Clothing Manipulation Details (indicate cue type and reason): Pt incontinent of stool.  Requires max A for peri care in standing      Functional mobility during ADLs: Minimal assistance;+2 for physical assistance;+2 for safety/equipment;Maximal assistance;Rolling walker       Vision       Perception     Praxis  Cognition Arousal/Alertness: Awake/alert Behavior During Therapy: WFL for tasks assessed/performed Overall Cognitive Status: No family/caregiver present to determine baseline cognitive functioning                                 General Comments: Pt requires cues for sequencing and safety, but demonstrates improved awareness of deficits and implications for function          Exercises     Shoulder Instructions       General Comments Pt very tearful today re: inability to discharge home today and also due to concern that she requires more assist than she has available at home     Pertinent Vitals/ Pain       Pain Assessment: Faces Faces Pain Scale: Hurts even more Pain Location: BL Knees, R foot  Pain Descriptors / Indicators: Aching;Discomfort  Home Living                                          Prior Functioning/Environment              Frequency  Min 2X/week        Progress Toward Goals  OT Goals(current goals can now be found in the care plan section)  Progress towards OT goals: Progressing toward goals  Acute Rehab OT Goals Patient Stated Goal: to go home tomorrow.  And to regain full independence including walking   Plan Discharge plan remains appropriate    Co-evaluation    PT/OT/SLP Co-Evaluation/Treatment: Yes Reason for Co-Treatment: For patient/therapist safety;To address functional/ADL transfers   OT goals addressed during session: ADL's and self-care      AM-PAC PT "6 Clicks" Daily Activity     Outcome Measure   Help from another person eating meals?: None Help from another person taking care of personal grooming?: A Little Help from another person toileting, which includes using toliet, bedpan, or urinal?: A Lot Help from another person bathing (including washing, rinsing, drying)?: A Lot Help from another person to put on and taking off regular upper body clothing?: A Little Help from another person to put on and taking off regular lower body clothing?: Total 6 Click Score: 15    End of Session Equipment Utilized During Treatment: Rolling walker;Other (comment)(sliding board )  OT Visit Diagnosis: Unsteadiness on feet (R26.81);Repeated falls (R29.6)   Activity Tolerance Patient limited by fatigue   Patient Left in bed;with call bell/phone within reach   Nurse Communication Mobility  status        Time: 8756-4332 OT Time Calculation (min): 58 min  Charges: OT General Charges $OT Visit: 1 Visit OT Treatments $Self Care/Home Management : 23-37 mins  [employer], OTR/L [phone]    Jeani Hawking M 06/11/2017, 12:29 PM

## 2017-06-11 NOTE — Progress Notes (Signed)
PROGRESS NOTE        PATIENT DETAILS Name: Andrea Yu Age: 77 y.o. Sex: female Date of Birth: 09-May-1940 Admit Date: 06/06/2017 Admitting Physician Eduard Clos, MD TTS:[id number], Reuel Boom, MD  Brief Narrative: Patient is a 77 y.o. female with history of PAF not on long-term anticoagulation, psoriatic arthritis on chronic steroids recent history of group B streptococcus bacteremia and T-spine discitis-on outpatient antimicrobial therapy with Rocephin which was then changed to vancomycin-presenting with confusion, fever and worsening back pain.  Subjective: Sleeping comfortably-denies any chest pain or shortness of breath.  Completely awake and alert.  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  Refused MRI of her back-however CT of the abdomen and pelvis shows possible abscess.  Spoke with infectious disease and subsequently with interventional radiology-plans are for possible CT-guided drainage.  Remains on daptomycin and ciprofloxacin.    Acute metabolic encephalopathy: Likely secondary to above-resolved.    Neutropenia: Thought to be either from vancomycin or Rocephin-afebrile-work-up so far negative-vitamin B12/folate/LDH all within normal limits.  Pathology smear review showed left shift with toxic granulation.  Had discussed case with Dr. Parke Poisson hematology on 5/16-recommends supportive care-and thinks that WBC should improve with time.  Follow-if no improvement in the next few days-may need a formal hematology evaluation.  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 Follow Hb  Thrombocytosis: Suspect reactive secondary to ongoing inflammation-continue to follow for now.    A. fib with RVR: Rate controlled-continue with cardizem and  Metoprolol.  Reviewed prior notes-not a candidate for anticoagulation due to significant fall risk and prior history of bleeding   Hypertension: Controlled-continue Lasix, losartan and Cardizem.   Psoriatic arthritis: Stable-initially on stress dose hydrocortisone-will transition back to prednisone today-but at a 10 mg dose-slowly transition back to her usual dosing in the next few days  Chronic pain syndrome: Appears to be stable-continue Neurontin, Cymbalta and Robaxin.  She will claims that back pain has worsened-but now she seems to be comfortable-she has refused a MRI for the past few days.  See above.    Frequent falls: Claims has fallen twice in the past week or so-appreciate PT evaluation-refuses SNF-will arrange for maximal home health services on discharge  DVT Prophylaxis: Prophylactic Lovenox   Code Status: Full code  Family Communication: None at bedside  Disposition Plan: Remain inpatient-home with HH services on discharge-likely over the weekend. Antimicrobial agents: Anti-infectives (From admission, onward)   Start     Dose/Rate Route Frequency Ordered Stop   06/08/17 0600  DAPTOmycin (CUBICIN) 420 mg in sodium chloride 0.9 % IVPB  Status:  Discontinued     420 mg 216.8 mL/hr over 30 Minutes Intravenous Every 24 hours 06/07/17 0249 06/07/17 1351   06/07/17 1800  ciprofloxacin (CIPRO) IVPB 400 mg     400 mg 200 mL/hr over 60 Minutes Intravenous 2 times daily 06/07/17 0249     06/07/17 0215  ciprofloxacin (CIPRO) IVPB 400 mg     400 mg 200 mL/hr over 60 Minutes Intravenous  Once 06/07/17 0203 06/07/17 0400   06/07/17 0215  DAPTOmycin (CUBICIN) 420 mg in sodium chloride 0.9 % IVPB     420 mg 216.8 mL/hr over 30 Minutes Intravenous Every 24 hours 06/07/17 0203  Procedures: None  CONSULTS:  ID  Time spent: 25- minutes-Greater than 50% of this time was spent in counseling, explanation of diagnosis, planning of further management, and coordination of  care.  MEDICATIONS: Scheduled Meds: . aspirin EC  81 mg Oral QPM  . diltiazem  180 mg Oral QHS  . DULoxetine  90 mg Oral Daily  . enoxaparin (LOVENOX) injection  40 mg Subcutaneous Daily  . feeding supplement (PRO-STAT SUGAR FREE 64)  30 mL Oral BID  . furosemide  80 mg Oral Daily  . gabapentin  600 mg Oral Daily   And  . gabapentin  600 mg Oral QHS  . hydrocortisone sod succinate (SOLU-CORTEF) inj  25 mg Intravenous Q8H  . losartan  100 mg Oral Daily  . magnesium oxide  400 mg Oral QPM  . metoprolol succinate  50 mg Oral Daily  . multivitamin with minerals  1 tablet Oral Daily  . mupirocin ointment  1 application Nasal BID  . pantoprazole  40 mg Oral Daily  . potassium chloride SA  20 mEq Oral BID  . vitamin C  500 mg Oral BID   Continuous Infusions: . ciprofloxacin Stopped (06/11/17 0456)  . DAPTOmycin (CUBICIN)  IV Stopped (06/11/17 0456)   PRN Meds:.acetaminophen **OR** acetaminophen, albuterol, HYDROcodone-acetaminophen, LORazepam, methocarbamol, ondansetron **OR** ondansetron (ZOFRAN) IV, polyvinyl alcohol, sodium chloride flush   PHYSICAL EXAM: Vital signs: Vitals:   06/10/17 1224 06/10/17 2025 06/10/17 2146 06/11/17 0612  BP: 128/71 133/66 132/80 125/69  Pulse: 81 82 89 86  Resp:    18  Temp: 98.5 F (36.9 C) 98.1 F (36.7 C)  (!) 97.5 F (36.4 C)  TempSrc: Oral Oral  Oral  SpO2: 96% 100% 97% 98%  Weight:    68.4 kg (150 lb 12.7 oz)  Height:       Filed Weights   06/09/17 0442 06/10/17 0404 06/11/17 0612  Weight: 69.7 kg (153 lb 10.6 oz) 71.1 kg (156 lb 12 oz) 68.4 kg (150 lb 12.7 oz)   Body mass index is 30.46 kg/m.   General appearance:Awake, alert, not in any distress.  Eyes:no scleral icterus. HEENT: Atraumatic and Normocephalic Neck: supple, no JVD. Resp:Good air entry bilaterally,no rales or rhonchi CVS: S1 S2 regular, no murmurs.  GI: Bowel sounds present, Non tender and not distended with no gaurding, rigidity or rebound. Extremities: B/L  Lower Ext shows no edema, both legs are warm to touch Neurology:  Non focal Psychiatric: Normal judgment and insight. Normal mood. Musculoskeletal:No digital cyanosis Skin:No Rash, warm and dry Wounds:N/A  I have personally reviewed following labs and imaging studies  LABORATORY DATA: CBC: Recent Labs  Lab 06/06/17 2245 06/07/17 0604 06/08/17 0405 06/09/17 0422 06/10/17 0412  WBC 1.9* 2.8* 1.6* 2.2* 2.6*  NEUTROABS 0.0* 0.1* 0.1* 0.0* 0.3*  HGB 10.3* 8.9* 8.3* 8.4* 8.1*  HCT 32.3* 27.6* 26.8* 26.6* 26.1*  MCV 84.8 83.9 84.5 85.0 83.9  PLT 541* 528* 540* 538* 546*    Basic Metabolic Panel: Recent Labs  Lab 06/06/17 2245 06/07/17 0604 06/08/17 0405  NA 139 140 139  K 3.2* 3.0* 3.7  CL 106 109 106  CO2 19* 19* 24  GLUCOSE 91 73 126*  BUN 5* 5* 12  CREATININE 0.65 0.54 0.61  CALCIUM 8.7* 8.4* 8.6*    GFR: Estimated Creatinine Clearance: 48.8 mL/min (by C-G formula based on SCr of 0.61 mg/dL).  Liver Function Tests: Recent Labs  Lab 06/06/17 2245 06/07/17 0604  AST 14* 11*  ALT 11* 9*  ALKPHOS 147* 131*  BILITOT 0.8 0.8  PROT 6.0* 5.3*  ALBUMIN 2.6* 2.3*   No results for input(s): LIPASE, AMYLASE in the last 168 hours. No results for input(s): AMMONIA in the last 168 hours.  Coagulation Profile: Recent Labs  Lab 06/06/17 2245  INR 1.19    Cardiac Enzymes: Recent Labs  Lab 06/08/17 0405  CKTOTAL 13*    BNP (last 3 results) No results for input(s): PROBNP in the last 8760 hours.  HbA1C: No results for input(s): HGBA1C in the last 72 hours.  CBG: No results for input(s): GLUCAP in the last 168 hours.  Lipid Profile: No results for input(s): CHOL, HDL, LDLCALC, TRIG, CHOLHDL, LDLDIRECT in the last 72 hours.  Thyroid Function Tests: No results for input(s): TSH, T4TOTAL, FREET4, T3FREE, THYROIDAB in the last 72 hours.  Anemia Panel: Recent Labs    06/09/17 1228  FOLATE 12.6  FERRITIN 226  TIBC 277  IRON 50  RETICCTPCT 2.4     Urine analysis:    Component Value Date/Time   COLORURINE YELLOW 06/06/2017 2245   APPEARANCEUR CLOUDY (A) 06/06/2017 2245   LABSPEC 1.018 06/06/2017 2245   PHURINE 6.0 06/06/2017 2245   GLUCOSEU NEGATIVE 06/06/2017 2245   HGBUR NEGATIVE 06/06/2017 2245   BILIRUBINUR NEGATIVE 06/06/2017 2245   KETONESUR 20 (A) 06/06/2017 2245   PROTEINUR 30 (A) 06/06/2017 2245   UROBILINOGEN 0.2 09/14/2014 1637   NITRITE NEGATIVE 06/06/2017 2245   LEUKOCYTESUR NEGATIVE 06/06/2017 2245    Sepsis Labs: Lactic Acid, Venous    Component Value Date/Time   LATICACIDVEN 1.53 06/06/2017 2251    MICROBIOLOGY: Recent Results (from the past 240 hour(s))  Culture, blood (Routine x 2)     Status: None (Preliminary result)   Collection Time: 06/07/17  1:05 AM  Result Value Ref Range Status   Specimen Description BLOOD LEFT FOREARM  Final   Special Requests   Final    BOTTLES DRAWN AEROBIC AND ANAEROBIC Blood Culture adequate volume   Culture   Final    NO GROWTH 3 DAYS Performed at [facility], [address], [place], [place] [address]    Report Status PENDING  Incomplete  Culture, blood (Routine x 2)     Status: None (Preliminary result)   Collection Time: 06/07/17  1:10 AM  Result Value Ref Range Status   Specimen Description BLOOD RIGHT WRIST  Final   Special Requests   Final    BOTTLES DRAWN AEROBIC AND ANAEROBIC Blood Culture adequate volume   Culture   Final    NO GROWTH 3 DAYS Performed at [facility], [address], [place], [place] [address]    Report Status PENDING  Incomplete  Urine culture     Status: Abnormal   Collection Time: 06/07/17  1:18 AM  Result Value Ref Range Status   Specimen Description URINE, CATHETERIZED  Final   Special Requests   Final    NONE Performed at [facility], [address], [place], [place] [address]    Culture MULTIPLE SPECIES PRESENT, SUGGEST RECOLLECTION (A)  Final   Report Status 06/08/2017 FINAL  Final  MRSA PCR  Screening     Status: Abnormal   Collection Time: 06/07/17  9:56 AM  Result Value Ref Range Status   MRSA by PCR POSITIVE (A) NEGATIVE Final    Comment:        The GeneXpert MRSA Assay (FDA approved for NASAL specimens only), is one component of a comprehensive  MRSA colonization surveillance program. It is not intended to diagnose MRSA infection nor to guide or monitor treatment for MRSA infections. RESULT CALLED TO, READ BACK BY AND VERIFIED WITH: RN Zebedee Iba [id number] MLM Performed at [facility], [address], [place], [place] [address]     RADIOLOGY STUDIES/RESULTS: Dg Chest 2 View  Result Date: 06/06/2017 CLINICAL DATA:  77 y/o F; 1 week of back pain and confusion with fever. EXAM: CHEST - 2 VIEW COMPARISON:  05/04/2017 chest radiograph FINDINGS: Stable normal cardiac silhouette given projection and technique. Aortic atherosclerosis with calcification. Bronchitic changes in the left lung base. No focal consolidation. No pleural effusion or pneumothorax. 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 Right upper quadrant cholecystectomy clips. IMPRESSION: 1. Bronchitic changes in the left lung base. No focal consolidation. 2. Aortic atherosclerosis. Electronically Signed   By: Mitzi Hansen M.D.   On: 06/06/2017 23:36   Ct Head Wo Contrast  Result Date: 06/07/2017 CLINICAL DATA:  77 y/o F; fever and back pain. Altered mental status. EXAM: CT HEAD WITHOUT CONTRAST TECHNIQUE: Contiguous axial images were obtained from the base of the skull through the vertex without intravenous contrast. COMPARISON:  09/14/2014 CT head FINDINGS: Brain: No evidence of acute infarction, hemorrhage, hydrocephalus, extra-axial collection or mass lesion/mass effect. Stable chronic microvascular ischemic changes and parenchymal volume loss of the brain. Vascular: No hyperdense vessel or unexpected  calcification. Skull: Normal. Negative for fracture or focal lesion. Sinuses/Orbits: No acute finding. Other: None. IMPRESSION: 1. No acute intracranial abnormality identified. 2. Stable chronic microvascular ischemic changes and parenchymal volume loss of the brain. Electronically Signed   By: Mitzi Hansen M.D.   On: 06/07/2017 05:20   Ct Abdomen Pelvis W Contrast  Result Date: 06/11/2017 CLINICAL DATA:  Abdominal pain, fever, abscess suspected. EXAM: CT ABDOMEN AND PELVIS WITH CONTRAST TECHNIQUE: Multidetector CT imaging of the abdomen and pelvis was performed using the standard protocol following bolus administration of intravenous contrast. CONTRAST:  OMNIPAQUE IOHEXOL 300 MG/ML  SOLN COMPARISON:  CT 04/29/2017 and pelvic MRI 05/02/2017 FINDINGS: Lower chest: Top-normal size heart with dependent bibasilar atelectasis. No pulmonary consolidation or effusion. Hepatobiliary: Cholecystectomy. No space-occupying mass of the liver. Mild fatty infiltration along the falciform ligament as before. Pancreas: No inflammation, mass or ductal dilatation. Spleen: Normal size spleen without mass. Adrenals/Urinary Tract: Normal bilateral adrenal glands. Stable exophytic cyst arising from the posterior aspect of the right upper pole of the kidney measuring 8 mm compatible with a cyst. This is too small to further characterize. No hydroureteronephrosis. Normal bladder. Stomach/Bowel: Colonic diverticulosis of the sigmoid colon without acute diverticulitis. No bowel obstruction. The stomach and colon are nonacute. Vascular/Lymphatic: Aortoiliac atherosclerosis.  No adenopathy. Reproductive: Status post hysterectomy. No adnexal masses. Other: No supplemental non categorized findings. Musculoskeletal: Reaccumulation of fluid adjacent to the right ischial tuberosity now measuring 4.7 x 4.3 cm. On prior MRI, this collection had an involuted appearance with crenulated margins. This is no longer the case where  reaccumulation of fluid gives this finding a more distended ovoid appearance though overall the dimensions are slightly smaller than on prior exams. Stable right greater trochanteric bursal fluid measuring 3.4 x 1.6 cm. Stable shaved or truncated appearance of the right posterior ischial tuberosity. Lower thoracic and lumbar spondylosis with multilevel degenerative disc disease. Posterolateral rod and pedicle screw fixation from L3 through L5. Chronic grade 1 anterolisthesis of L4 on L5 with interbody fusion at  L3-4. Degenerative disc disease L2-3 and T12-L1. Stable endplate irregularities of the lower thoracic spine and at L1-2. Periumbilical fat containing ventral hernias. IMPRESSION: 1. Reaccumulation of fluid adjacent to the right ischial tuberosity not measuring 4.7 x 4.3 cm. Potential evolving abscess is not excluded. 2. Stable right greater trochanteric bursal fluid measuring 3.4 x 1.6 cm. 3. Thoracolumbar spondylosis with lumbar fixation hardware as before. Electronically Signed   By: Tollie Eth M.D.   On: 06/11/2017 03:28   Dg Foot Complete Right  Result Date: 06/07/2017 CLINICAL DATA:  77 y/o F; wound on the bottom of the heel. History of ankle fusion 2017. EXAM: RIGHT FOOT COMPLETE - 3+ VIEW COMPARISON:  07/12/2016 right ankle radiograph FINDINGS: There is a rod fixing the ankle joint with a transversely oriented interlocking screw through the calcaneus. Mild increase in periprosthetic lucency surrounding the rod and interlocking screw within the calcaneus and lower tibia. Severe degenerative changes of the tibiotalar joint with flattening of talar dome. Pes planus. Bones are diffusely demineralized and there is vascular calcification. Soft tissue swelling of the heel, no bony destructive change to suggest osteomyelitis. IMPRESSION: 1. No findings of osteomyelitis. 2. Mildly increased periprosthetic lucency along the rod traversing ankle fusion and interlocking screw within the calcaneus probably  representing hardware loosening. Electronically Signed   By: Mitzi Hansen M.D.   On: 06/07/2017 03:45     LOS: 4 days   Jeoffrey Massed, MD  [facility]  If 7PM-7AM, please contact night-coverage [email or web address] Password [facility] 06/11/2017, 10:41 AM

## 2017-06-11 NOTE — Progress Notes (Signed)
Pt stable throughout the day, denies CP and SOB, IVABX contd, no any other significant complaints, will continue to monitor  Lonia Farber, RN

## 2017-06-12 ENCOUNTER — Other Ambulatory Visit (HOSPITAL_COMMUNITY): Payer: Medicare Other

## 2017-06-12 ENCOUNTER — Inpatient Hospital Stay (HOSPITAL_COMMUNITY): Payer: Medicare Other

## 2017-06-12 LAB — CULTURE, BLOOD (ROUTINE X 2)
CULTURE: NO GROWTH
CULTURE: NO GROWTH
SPECIAL REQUESTS: ADEQUATE
Special Requests: ADEQUATE

## 2017-06-12 LAB — CBC WITH DIFFERENTIAL/PLATELET
BASOS PCT: 0 %
Basophils Absolute: 0 10*3/uL (ref 0.0–0.1)
EOS ABS: 0 10*3/uL (ref 0.0–0.7)
Eosinophils Relative: 0 %
HEMATOCRIT: 30.2 % — AB (ref 36.0–46.0)
Hemoglobin: 9.3 g/dL — ABNORMAL LOW (ref 12.0–15.0)
Lymphocytes Relative: 55 %
Lymphs Abs: 3.9 10*3/uL (ref 0.7–4.0)
MCH: 26.2 pg (ref 26.0–34.0)
MCHC: 30.8 g/dL (ref 30.0–36.0)
MCV: 85.1 fL (ref 78.0–100.0)
MONO ABS: 0.9 10*3/uL (ref 0.1–1.0)
Monocytes Relative: 13 %
NEUTROS ABS: 2.2 10*3/uL (ref 1.7–7.7)
Neutrophils Relative %: 32 %
Platelets: 546 10*3/uL — ABNORMAL HIGH (ref 150–400)
RBC: 3.55 MIL/uL — ABNORMAL LOW (ref 3.87–5.11)
RDW: 15.6 % — AB (ref 11.5–15.5)
WBC: 7 10*3/uL (ref 4.0–10.5)

## 2017-06-12 MED ORDER — LIDOCAINE HCL (PF) 1 % IJ SOLN
INTRAMUSCULAR | Status: AC
  Filled 2017-06-12: qty 30

## 2017-06-12 MED ORDER — PREDNISONE 10 MG PO TABS
10.0000 mg | ORAL_TABLET | Freq: Every day | ORAL | Status: DC
  Administered 2017-06-12 – 2017-06-14 (×3): 10 mg via ORAL
  Filled 2017-06-12 (×3): qty 1

## 2017-06-12 MED ORDER — PREDNISONE 5 MG PO TABS: 5.0000 mg | ORAL_TABLET | Freq: Every day | ORAL | Status: DC

## 2017-06-12 NOTE — Procedures (Signed)
  Procedure: US aspiration R ischial collection 71ml thin bloody fluid for GS, C&S EBL:   minimal Complications:  none immediate  See full dictation in [employer].  Thora Lance MD Main # [phone] Pager  [phone]

## 2017-06-12 NOTE — Progress Notes (Signed)
Occupational Therapy Treatment Patient Details Name: Andrea Yu MRN: [id number] DOB: 04/24/1940 Today's Date: 06/12/2017    History of present illness 77 y.o. female admitted 06/07/17 with fever, confusion, while blood cell count. 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 PMH includes: psoriatic arthritis, peripheral neuropathy, HTN, A-Fib, Back surgery, ankle fusion, lumbar fusion, B TKA,    OT comments  Pt with improved ability to perform transfers today requiring less assist than on previous days.  She now is able to transfer with assist of one person, but requires min guard - mod A.  Status is dependent on fatigue.  She fatigues quickly, and becomes very impulsive as she fatigues.  She continues to be very high risk for falls.  Recommend Hospital bed as well as drop arm commode for home use IF she chooses to discharge home.  SNF level rehab is the recommendation, but pt likely to refuse   Follow Up Recommendations  SNF;Supervision/Assistance - 24 hour  - Pt likely to refuse SNF, therefore, recommend HHOT, PT, SW, RN, aide   Equipment Recommendations  Drop arm commode and hospital bed    Recommendations for Other Services      Precautions / Restrictions Precautions Precautions: Fall       Mobility Bed Mobility Overal bed mobility: Needs Assistance Bed Mobility: Supine to Sit;Sit to Supine     Supine to sit: Supervision Sit to supine: Supervision      Transfers Overall transfer level: Needs assistance Equipment used: Rolling walker (2 wheeled) Transfers: Sit to/from Stand;Lateral/Scoot Transfers Sit to Stand: Min assist;Min guard Stand pivot transfers: Min assist;Mod assist      Lateral/Scoot Transfers: Min assist;With slide board General transfer comment: Pt stood x 2 with min guard assist, and min A x 1 as she fatigued.  She performed stand pivot to [facility] with min A, and  mod A to transfer back to bed as she attempted to sit prematurely      Balance Overall balance assessment: Needs assistance Sitting-balance support: Feet supported;Single extremity supported Sitting balance-Leahy Scale: Fair     Standing balance support: Bilateral upper extremity supported;During functional activity Standing balance-Leahy Scale: Poor Standing balance comment: requires UE support and min guard to min A                            ADL either performed or assessed with clinical judgement   ADL                           Toilet Transfer: Minimal assistance;Moderate assistance;Stand-pivot;BSC(sliding board ) Toilet Transfer Details (indicate cue type and reason): able to transfer to and from [facility] with sliding board and min A, once board placed.  She then performed stand pivot transfer with mod A as she attempts to sit prematurely  Toileting- Architect and Hygiene: Moderate assistance;Sit to/from stand       Functional mobility during ADLs: Moderate assistance;Minimal assistance;Rolling walker       Vision       Perception     Praxis      Cognition Arousal/Alertness: Awake/alert Behavior During Therapy: WFL for tasks assessed/performed Overall Cognitive Status: No family/caregiver present to determine baseline cognitive functioning                                 General Comments: Pt continues with  poor problem solving and judement         Exercises     Shoulder Instructions       General Comments Continued with frank discussions with pt re: discharge plans, recommendations for SNF level care, limitations with care at home and high risk of falls if she returns home.  Discussed hospital bed at home to allow her to adjust bed to height of surface she is transferring to.  She initially was agreeable, then stated "I just need to go home and see how things go, then I will decide".  Explained to her that she is not moving well now, and we need to optimize her environment - that she will  likely fall if she has to transfer to uneven surfaces.  She then began to consider SNF, but waffled back and forth     Pertinent Vitals/ Pain       Pain Assessment: Faces Faces Pain Scale: Hurts a little bit Pain Location: BL Knees, R foot  Pain Descriptors / Indicators: Grimacing Pain Intervention(s): Monitored during session  Home Living                                          Prior Functioning/Environment              Frequency  Min 2X/week        Progress Toward Goals  OT Goals(current goals can now be found in the care plan section)  Progress towards OT goals: Progressing toward goals     Plan Discharge plan remains appropriate    Co-evaluation                 AM-PAC PT "6 Clicks" Daily Activity     Outcome Measure   Help from another person eating meals?: None Help from another person taking care of personal grooming?: A Little Help from another person toileting, which includes using toliet, bedpan, or urinal?: A Lot Help from another person bathing (including washing, rinsing, drying)?: A Lot Help from another person to put on and taking off regular upper body clothing?: A Little Help from another person to put on and taking off regular lower body clothing?: Total 6 Click Score: 15    End of Session Equipment Utilized During Treatment: Rolling walker;Gait belt;Other (comment)(sliding board )  OT Visit Diagnosis: Unsteadiness on feet (R26.81);Repeated falls (R29.6)   Activity Tolerance Patient limited by fatigue   Patient Left in bed;with call bell/phone within reach;with bed alarm set   Nurse Communication Mobility status        Time: 3419-3790 OT Time Calculation (min): 58 min  Charges: OT General Charges $OT Visit: 1 Visit OT Treatments $Self Care/Home Management : 53-67 mins  [employer], OTR/L [phone]    Jeani Hawking M 06/12/2017, 4:26 PM

## 2017-06-12 NOTE — Progress Notes (Addendum)
PROGRESS NOTE        PATIENT DETAILS Name: Andrea Yu Age: 77 y.o. Sex: female Date of Birth: 1940/02/22 Admit Date: 06/06/2017 Admitting Physician Eduard Clos, MD HAL:[id number], Reuel Boom, MD  Brief Narrative: Patient is a 77 y.o. female with history of PAF not on long-term anticoagulation, psoriatic arthritis on chronic steroids recent history of group B streptococcus bacteremia and T-spine discitis-on outpatient antimicrobial therapy with Rocephin which was then changed to vancomycin-presenting with confusion, fever and worsening back pain.  Subjective: No chest pain or shortness of breath.  Anxious to be discharged from the hospital.  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  Patient refused a MRI of her back, however a CT scan of the abdomen/pelvis on 5/17  abscess near the right ischial tuberosity, spoke with infectious disease and then with interventional radiology-she is scheduled for a ultrasound-guided aspiration later today.  In the meantime, continue with daptomycin and ciprofloxacin.  Infectious disease following.  Acute metabolic encephalopathy: Secondary to above-has resolved.    Neutropenia: Markedly improved-likely secondary to vancomycin/ Rocephin.  LDH/vitamin B12/folate all within normal limits.  HIV negative.  Had briefly discussed case with hematology-Dr. Mosetta Putt on  5/16-recommendations were to provide supportive care and await improvement.  Since it has improved-doubt further work-up is required.   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  Hemoglobin currently stable-continue to follow periodically.  Thrombocytosis: Suspect reactive secondary to ongoing inflammation-continue to follow for now.    A.  fib with RVR: Rate controlled-continue with cardizem and Metoprolol.  Reviewed prior notes-not a candidate for anticoagulation due to significant fall risk and prior history of bleeding   Hypertension: Controlled-continue Lasix, losartan and Cardizem.   Psoriatic arthritis: Stable-initially on stress dose hydrocortisone-will transition back to prednisone today-but at a 10 mg dose-slowly transition back to her usual dosing in the next few days  Chronic pain syndrome: Appears to be stable-continue Neurontin, Cymbalta and Robaxin.  She will claims that back pain has worsened-but now she seems to be comfortable-she has refused a MRI for the past few days.  See above.    Frequent falls: Claims has fallen twice in the past week or so-appreciate PT evaluation-refuses SNF-will arrange for maximal home health services on discharge  DVT Prophylaxis: Prophylactic Lovenox   Code Status: Full code  Family Communication: None at bedside-called daughter-left Voicemail  Disposition Plan: Remain inpatient-home with HH services on discharge-hopefully on 5/20 if clinical improvement continues.  Antimicrobial agents: Anti-infectives (From admission, onward)   Start     Dose/Rate Route Frequency Ordered Stop   06/08/17 0600  DAPTOmycin (CUBICIN) 420 mg in sodium chloride 0.9 % IVPB  Status:  Discontinued     420 mg 216.8 mL/hr over 30 Minutes Intravenous Every 24 hours 06/07/17 0249 06/07/17 1351   06/07/17 1800  ciprofloxacin (CIPRO) IVPB 400 mg     400 mg 200 mL/hr over 60 Minutes Intravenous 2 times daily 06/07/17 0249     06/07/17 0215  ciprofloxacin (CIPRO) IVPB 400 mg     400 mg 200 mL/hr over 60 Minutes Intravenous  Once 06/07/17 0203 06/07/17 0400   06/07/17 0215  DAPTOmycin (CUBICIN) 420 mg in sodium chloride 0.9 % IVPB  420 mg 216.8 mL/hr over 30 Minutes Intravenous Every 24 hours 06/07/17 0203        Procedures: None  CONSULTS:  ID, IR  Time spent: 25- minutes-Greater than 50%  of this time was spent in counseling, explanation of diagnosis, planning of further management, and coordination of care.  MEDICATIONS: Scheduled Meds: . aspirin EC  81 mg Oral QPM  . diltiazem  180 mg Oral QHS  . DULoxetine  90 mg Oral Daily  . enoxaparin (LOVENOX) injection  40 mg Subcutaneous Daily  . feeding supplement (PRO-STAT SUGAR FREE 64)  30 mL Oral BID  . furosemide  80 mg Oral Daily  . gabapentin  600 mg Oral Daily   And  . gabapentin  600 mg Oral QHS  . losartan  100 mg Oral Daily  . magnesium oxide  400 mg Oral QPM  . metoprolol succinate  50 mg Oral Daily  . multivitamin with minerals  1 tablet Oral Daily  . pantoprazole  40 mg Oral Daily  . potassium chloride SA  20 mEq Oral BID  . [START ON 06/16/2017] predniSONE  5 mg Oral Q breakfast   Followed by  . predniSONE  10 mg Oral Q breakfast  . vitamin C  500 mg Oral BID   Continuous Infusions: . ciprofloxacin Stopped (06/12/17 0554)  . DAPTOmycin (CUBICIN)  IV Stopped (06/12/17 0413)   PRN Meds:.acetaminophen **OR** acetaminophen, albuterol, HYDROcodone-acetaminophen, LORazepam, methocarbamol, ondansetron **OR** ondansetron (ZOFRAN) IV, polyvinyl alcohol, sodium chloride flush   PHYSICAL EXAM: Vital signs: Vitals:   06/11/17 0612 06/11/17 1209 06/11/17 2132 06/12/17 0456  BP: 125/69 125/81 134/74 129/74  Pulse: 86 90 87 86  Resp: 18 18 18 18   Temp: (!) 97.5 F (36.4 C) 98.3 F (36.8 C) 98.1 F (36.7 C) 97.6 F (36.4 C)  TempSrc: Oral Oral Oral Oral  SpO2: 98% 97% 99% 96%  Weight: 68.4 kg (150 lb 12.7 oz)   66.7 kg (147 lb 0.8 oz)  Height:       Filed Weights   06/10/17 0404 06/11/17 0612 06/12/17 0456  Weight: 71.1 kg (156 lb 12 oz) 68.4 kg (150 lb 12.7 oz) 66.7 kg (147 lb 0.8 oz)   Body mass index is 29.7 kg/m.   General appearance:Awake, alert, not in any distress.  Eyes:no scleral icterus. HEENT: Atraumatic and Normocephalic Neck: supple, no JVD. Resp:Good air entry bilaterally,no rales or  rhonchi CVS: S1 S2 regular, no murmurs.  GI: Bowel sounds present, Non tender and not distended with no gaurding, rigidity or rebound. Extremities: B/L Lower Ext shows no edema, both legs are warm to touch Neurology:  Non focal Psychiatric: Normal judgment and insight. Normal mood. Musculoskeletal:No digital cyanosis Skin:No Rash, warm and dry Wounds:N/A  I have personally reviewed following labs and imaging studies  LABORATORY DATA: CBC: Recent Labs  Lab 06/08/17 0405 06/09/17 0422 06/10/17 0412 06/11/17 1036 06/12/17 0346  WBC 1.6* 2.2* 2.6* 3.5* 7.0  NEUTROABS 0.1* 0.0* 0.3* 0.7* 2.2  HGB 8.3* 8.4* 8.1* 9.2* 9.3*  HCT 26.8* 26.6* 26.1* 29.7* 30.2*  MCV 84.5 85.0 83.9 84.4 85.1  PLT 540* 538* 546* 553* 546*    Basic Metabolic Panel: Recent Labs  Lab 06/06/17 2245 06/07/17 0604 06/08/17 0405 06/11/17 1036  NA 139 140 139 142  K 3.2* 3.0* 3.7 3.2*  CL 106 109 106 105  CO2 19* 19* 24 27  GLUCOSE 91 73 126* 111*  BUN 5* 5* 12 20  CREATININE 0.65 0.54 0.61 0.84  CALCIUM 8.7* 8.4* 8.6* 8.7*    GFR: Estimated Creatinine Clearance: 45.8 mL/min (by C-G formula based on SCr of 0.84 mg/dL).  Liver Function Tests: Recent Labs  Lab 06/06/17 2245 06/07/17 0604  AST 14* 11*  ALT 11* 9*  ALKPHOS 147* 131*  BILITOT 0.8 0.8  PROT 6.0* 5.3*  ALBUMIN 2.6* 2.3*   No results for input(s): LIPASE, AMYLASE in the last 168 hours. No results for input(s): AMMONIA in the last 168 hours.  Coagulation Profile: Recent Labs  Lab 06/06/17 2245  INR 1.19    Cardiac Enzymes: Recent Labs  Lab 06/08/17 0405  CKTOTAL 13*    BNP (last 3 results) No results for input(s): PROBNP in the last 8760 hours.  HbA1C: No results for input(s): HGBA1C in the last 72 hours.  CBG: No results for input(s): GLUCAP in the last 168 hours.  Lipid Profile: No results for input(s): CHOL, HDL, LDLCALC, TRIG, CHOLHDL, LDLDIRECT in the last 72 hours.  Thyroid Function Tests: No results  for input(s): TSH, T4TOTAL, FREET4, T3FREE, THYROIDAB in the last 72 hours.  Anemia Panel: Recent Labs    06/09/17 1228  FOLATE 12.6  FERRITIN 226  TIBC 277  IRON 50  RETICCTPCT 2.4    Urine analysis:    Component Value Date/Time   COLORURINE YELLOW 06/06/2017 2245   APPEARANCEUR CLOUDY (A) 06/06/2017 2245   LABSPEC 1.018 06/06/2017 2245   PHURINE 6.0 06/06/2017 2245   GLUCOSEU NEGATIVE 06/06/2017 2245   HGBUR NEGATIVE 06/06/2017 2245   BILIRUBINUR NEGATIVE 06/06/2017 2245   KETONESUR 20 (A) 06/06/2017 2245   PROTEINUR 30 (A) 06/06/2017 2245   UROBILINOGEN 0.2 09/14/2014 1637   NITRITE NEGATIVE 06/06/2017 2245   LEUKOCYTESUR NEGATIVE 06/06/2017 2245    Sepsis Labs: Lactic Acid, Venous    Component Value Date/Time   LATICACIDVEN 1.53 06/06/2017 2251    MICROBIOLOGY: Recent Results (from the past 240 hour(s))  Culture, blood (Routine x 2)     Status: None (Preliminary result)   Collection Time: 06/07/17  1:05 AM  Result Value Ref Range Status   Specimen Description BLOOD LEFT FOREARM  Final   Special Requests   Final    BOTTLES DRAWN AEROBIC AND ANAEROBIC Blood Culture adequate volume   Culture   Final    NO GROWTH 4 DAYS Performed at [facility], [address], [place], [place] [address]    Report Status PENDING  Incomplete  Culture, blood (Routine x 2)     Status: None (Preliminary result)   Collection Time: 06/07/17  1:10 AM  Result Value Ref Range Status   Specimen Description BLOOD RIGHT WRIST  Final   Special Requests   Final    BOTTLES DRAWN AEROBIC AND ANAEROBIC Blood Culture adequate volume   Culture   Final    NO GROWTH 4 DAYS Performed at [facility], [address], [place], [place] [address]    Report Status PENDING  Incomplete  Urine culture     Status: Abnormal   Collection Time: 06/07/17  1:18 AM  Result Value Ref Range Status   Specimen Description URINE, CATHETERIZED  Final   Special Requests   Final    NONE Performed  at [facility], [address] [place], [place] [address]    Culture MULTIPLE SPECIES PRESENT, SUGGEST RECOLLECTION (A)  Final   Report Status 06/08/2017 FINAL  Final  MRSA PCR Screening     Status: Abnormal   Collection Time: 06/07/17  9:56 AM  Result Value Ref Range Status   MRSA by PCR POSITIVE (A) NEGATIVE Final    Comment:        The GeneXpert MRSA Assay (FDA approved for NASAL specimens only), is one component of a comprehensive MRSA colonization surveillance program. It is not intended to diagnose MRSA infection nor to guide or monitor treatment for MRSA infections. RESULT CALLED TO, READ BACK BY AND VERIFIED WITH: RN Zebedee Iba [phone] [id number] MLM Performed at [facility], [address] [place], [place] [address]     RADIOLOGY STUDIES/RESULTS: Dg Chest 2 View  Result Date: 06/06/2017 CLINICAL DATA:  77 y/o F; 1 week of back pain and confusion with fever. EXAM: CHEST - 2 VIEW COMPARISON:  05/04/2017 chest radiograph FINDINGS: Stable normal cardiac silhouette given projection and technique. Aortic atherosclerosis with calcification. Bronchitic changes in the left lung base. No focal consolidation. No pleural effusion or pneumothorax. 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 Right upper quadrant cholecystectomy clips. IMPRESSION: 1. Bronchitic changes in the left lung base. No focal consolidation. 2. Aortic atherosclerosis. Electronically Signed   By: Mitzi Hansen M.D.   On: 06/06/2017 23:36   Ct Head Wo Contrast  Result Date: 06/07/2017 CLINICAL DATA:  77 y/o F; fever and back pain. Altered mental status. EXAM: CT HEAD WITHOUT CONTRAST TECHNIQUE: Contiguous axial images were obtained from the base of the skull through the vertex without intravenous contrast. COMPARISON:  09/14/2014 CT head FINDINGS: Brain: No evidence of acute infarction, hemorrhage, hydrocephalus,  extra-axial collection or mass lesion/mass effect. Stable chronic microvascular ischemic changes and parenchymal volume loss of the brain. Vascular: No hyperdense vessel or unexpected calcification. Skull: Normal. Negative for fracture or focal lesion. Sinuses/Orbits: No acute finding. Other: None. IMPRESSION: 1. No acute intracranial abnormality identified. 2. Stable chronic microvascular ischemic changes and parenchymal volume loss of the brain. Electronically Signed   By: Mitzi Hansen M.D.   On: 06/07/2017 05:20   Ct Abdomen Pelvis W Contrast  Result Date: 06/11/2017 CLINICAL DATA:  Abdominal pain, fever, abscess suspected. EXAM: CT ABDOMEN AND PELVIS WITH CONTRAST TECHNIQUE: Multidetector CT imaging of the abdomen and pelvis was performed using the standard protocol following bolus administration of intravenous contrast. CONTRAST:  OMNIPAQUE IOHEXOL 300 MG/ML  SOLN COMPARISON:  CT 04/29/2017 and pelvic MRI 05/02/2017 FINDINGS: Lower chest: Top-normal size heart with dependent bibasilar atelectasis. No pulmonary consolidation or effusion. Hepatobiliary: Cholecystectomy. No space-occupying mass of the liver. Mild fatty infiltration along the falciform ligament as before. Pancreas: No inflammation, mass or ductal dilatation. Spleen: Normal size spleen without mass. Adrenals/Urinary Tract: Normal bilateral adrenal glands. Stable exophytic cyst arising from the posterior aspect of the right upper pole of the kidney measuring 8 mm compatible with a cyst. This is too small to further characterize. No hydroureteronephrosis. Normal bladder. Stomach/Bowel: Colonic diverticulosis of the sigmoid colon without acute diverticulitis. No bowel obstruction. The stomach and colon are nonacute. Vascular/Lymphatic: Aortoiliac atherosclerosis.  No adenopathy. Reproductive: Status post hysterectomy. No adnexal masses. Other: No supplemental non categorized findings. Musculoskeletal: Reaccumulation of fluid  adjacent to the right ischial tuberosity now measuring 4.7 x 4.3 cm. On prior MRI, this collection had an involuted appearance with crenulated margins. This is no longer the case where reaccumulation of fluid gives this finding a more distended ovoid appearance though overall the dimensions are slightly smaller than on prior exams. Stable right greater trochanteric bursal fluid measuring 3.4 x 1.6  cm. Stable shaved or truncated appearance of the right posterior ischial tuberosity. Lower thoracic and lumbar spondylosis with multilevel degenerative disc disease. Posterolateral rod and pedicle screw fixation from L3 through L5. Chronic grade 1 anterolisthesis of L4 on L5 with interbody fusion at L3-4. Degenerative disc disease L2-3 and T12-L1. Stable endplate irregularities of the lower thoracic spine and at L1-2. Periumbilical fat containing ventral hernias. IMPRESSION: 1. Reaccumulation of fluid adjacent to the right ischial tuberosity not measuring 4.7 x 4.3 cm. Potential evolving abscess is not excluded. 2. Stable right greater trochanteric bursal fluid measuring 3.4 x 1.6 cm. 3. Thoracolumbar spondylosis with lumbar fixation hardware as before. Electronically Signed   By: Tollie Eth M.D.   On: 06/11/2017 03:28   Dg Foot Complete Right  Result Date: 06/07/2017 CLINICAL DATA:  77 y/o F; wound on the bottom of the heel. History of ankle fusion 2017. EXAM: RIGHT FOOT COMPLETE - 3+ VIEW COMPARISON:  07/12/2016 right ankle radiograph FINDINGS: There is a rod fixing the ankle joint with a transversely oriented interlocking screw through the calcaneus. Mild increase in periprosthetic lucency surrounding the rod and interlocking screw within the calcaneus and lower tibia. Severe degenerative changes of the tibiotalar joint with flattening of talar dome. Pes planus. Bones are diffusely demineralized and there is vascular calcification. Soft tissue swelling of the heel, no bony destructive change to suggest  osteomyelitis. IMPRESSION: 1. No findings of osteomyelitis. 2. Mildly increased periprosthetic lucency along the rod traversing ankle fusion and interlocking screw within the calcaneus probably representing hardware loosening. Electronically Signed   By: Mitzi Hansen M.D.   On: 06/07/2017 03:45     LOS: 5 days   Jeoffrey Massed, MD  [facility]  If 7PM-7AM, please contact night-coverage [email or web address] Password [facility] 06/12/2017, 9:56 AM

## 2017-06-12 NOTE — Progress Notes (Signed)
Pharmacy Antibiotic Note  Andrea Yu is a 77 y.o. female admitted on 06/06/2017 with neutropenic fever. 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 Pharmacy has been following for Daptomycin and Ciprofloxacin dosing, now on day # 5.  Today she is afebrile, WBC and ANC improving. No bmet today, but renal function stable.  She is now on a Prednisone taper.  Plan:  Continue Daptomycin 420 mg IV q24hrs (~6 mg/kg/day)  Continue Cipro 400 mg IV q12hrs.  Follow renal function, ANC, culture data, clinical progress.   Weekly CK while on Daptomycin. Next due 5/22.  Planning antibiotics thru 06/23/17 (8 weeks)  Height: 4\' 11"  (149.9 cm) Weight: 147 lb 0.8 oz (66.7 kg) IBW/kg (Calculated) : 43.2  Temp (24hrs), Avg:98 F (36.7 C), Min:97.6 F (36.4 C), Max:98.3 F (36.8 C)  Recent Labs  Lab 06/06/17 2245 06/06/17 2251 06/07/17 0604 06/08/17 0405 06/09/17 0422 06/10/17 0412 06/11/17 1036 06/12/17 0346  WBC 1.9*  --  2.8* 1.6* 2.2* 2.6* 3.5* 7.0  CREATININE 0.65  --  0.54 0.61  --   --  0.84  --   LATICACIDVEN  --  1.53  --   --   --   --   --   --     Estimated Creatinine Clearance: 45.8 mL/min (by C-G formula based on SCr of 0.84 mg/dL).    Allergies  Allergen Reactions  . Penicillins Anaphylaxis    THROAT SWELLING  Has patient had a PCN reaction causing immediate rash, facial/tongue/throat swelling, SOB or lightheadedness with hypotension:  # # YES # #  Has patient had a PCN reaction causing severe rash involving mucus membranes or skin necrosis: # # # YES # # # Has patient had a PCN reaction that required hospitalization:No Has patient had a PCN reaction occurring within the last 10 years:  # # YES # #  If all of the above answers are "NO", then may proceed with Cephalosporin use.   06/14/17 [Denosumab] Other (See Comments)    Severe Pain in the groin area.  . Ceftriaxone     Possible  cause of neutropenia  . Fosamax [Alendronate Sodium]     UNSPECIFIED REACTION     Antimicrobials this admission: Cipro 5/14>> Daptomycin 5/14>> CHG/Bactroban 5/14>>(5/18)  * prior CTX 4/6>>5/8 -> changed to Vancomycin per outpt ID note>stopped on admit  Dose adjustments this admission:  n/a  Microbiology results:  5/14 BCx: no growth x 3 days to date  5/14 urine: mult species  5/14 MRSA PCR positive   *Prior cultures:    3/24 blood: Group B Strep     3/24 urine: > 80K E coli   4/24, PharmD., MS Clinical Pharmacist Pager:  [phone] Thank you for allowing pharmacy to be part of this patients care team. 06/12/2017 11:01 AM

## 2017-06-13 ENCOUNTER — Ambulatory Visit: Payer: Medicare Other | Admitting: Internal Medicine

## 2017-06-13 LAB — C-REACTIVE PROTEIN: CRP: 1.3 mg/dL — AB (ref <1.0)

## 2017-06-13 LAB — SEDIMENTATION RATE: Sed Rate: 35 mm/hr — ABNORMAL HIGH (ref 0–22)

## 2017-06-13 MED ORDER — SODIUM CHLORIDE 0.9 % IV SOLN
500.0000 mg | INTRAVENOUS | Status: DC
  Administered 2017-06-14: 500 mg via INTRAVENOUS
  Filled 2017-06-13: qty 10

## 2017-06-13 MED ORDER — ALTEPLASE 2 MG IJ SOLR
2.0000 mg | Freq: Once | INTRAMUSCULAR | Status: AC
  Administered 2017-06-13: 2 mg
  Filled 2017-06-13: qty 2

## 2017-06-13 NOTE — Clinical Social Work Placement (Signed)
   CLINICAL SOCIAL WORK PLACEMENT  NOTE  Date:  06/13/2017  Patient Details  Name: Andrea Yu MRN: [id number] Date of Birth: May 15, 1940  Clinical Social Work is seeking post-discharge placement for this patient at the Skilled  Nursing Facility level of care (*CSW will initial, date and re-position this form in  chart as items are completed):  Yes   Patient/family provided with [facility] Clinical Social Work Department's list of facilities offering this level of care within the geographic area requested by the patient (or if unable, by the patient's family).  Yes   Patient/family informed of their freedom to choose among providers that offer the needed level of care, that participate in Medicare, Medicaid or managed care program needed by the patient, have an available bed and are willing to accept the patient.  Yes   Patient/family informed of [facility]'s ownership interest in [facility] and [facility], as well as of the fact that they are under no obligation to receive care at these facilities.  PASRR submitted to EDS on 06/13/17     PASRR number received on       Existing PASRR number confirmed on 06/13/17     FL2 transmitted to all facilities in geographic area requested by pt/family on 06/13/17     FL2 transmitted to all facilities within larger geographic area on       Patient informed that his/her managed care company has contracts with or will negotiate with certain facilities, including the following:            Patient/family informed of bed offers received.  Patient chooses bed at       Physician recommends and patient chooses bed at      Patient to be transferred to   on  .  Patient to be transferred to facility by       Patient family notified on   of transfer.  Name of family member notified:        PHYSICIAN Please sign FL2     Additional Comment:    _______________________________________________ Margarito Liner, LCSW 06/13/2017,  11:46 AM

## 2017-06-13 NOTE — Clinical Social Work Note (Signed)
CSW met with patient. She is now agreeable to SNF and understands she will owe a copay of around $160 per day. Patient stated, "I have no choice." [facility] is first preference. CSW spoke with admissions coordinator. They will start insurance authorization for potential discharge tomorrow, per MD note.  Dayton Scrape, [place]

## 2017-06-13 NOTE — Progress Notes (Signed)
PROGRESS NOTE        PATIENT DETAILS Name: Andrea Yu Age: 77 y.o. Sex: female Date of Birth: 01/14/1941 Admit Date: 06/06/2017 Admitting Physician Eduard Clos, MD PPJ:[email or web address], Reuel Boom, MD  Brief Narrative: Patient is a 77 y.o. female with history of PAF not on long-term anticoagulation, psoriatic arthritis on chronic steroids recent history of group B streptococcus bacteremia and T-spine discitis-on outpatient antimicrobial therapy with Rocephin which was then changed to vancomycin-presenting with confusion, fever and worsening back pain.  Subjective: Lying comfortably in bed-thinks she wants to go to SNF and wants to talk with the social worker.  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  Patient refused a MRI of her back, however a CT scan of the abdomen/pelvis on 5/17 showed a possible abscess near the right ischial tuberosity, she underwent ultrasound-guided aspiration on 5/19.  Await culture data.  Infectious disease following-remains on daptomycin and ciprofloxacin.  Await further recommendations from infectious disease.    Acute metabolic encephalopathy: Resolved-likely secondary to above.  Neutropenia: Markedly improved, thought to be secondary to either vancomycin or Rocephin.    LDH/vitamin B12/folate all within normal limits.  HIV negative.  Had briefly discussed case with hematology-Dr. Mosetta Putt on  5/16-recommendations were to provide supportive care and await improvement.  Since it has improved-doubt further work-up is required.   Anemia: Suspected anemia of chronic disease, hemoglobin stable-continue to follow periodically.   Thrombocytosis: Reactive secondary to ongoing inflammation-continue to follow.  .    A. fib with RVR:  Controlled-continue with metoprolol and Cardizem.  Reviewed prior notes in epic-not a candidate for oral anticoagulation due to history of bleeding and significant fall risk.    Hypertension: Controlled with Lasix, losartan and Cardizem.   Psoriatic arthritis: Stable-initially on stress dose hydrocortisone-will transition back to prednisone today-but at a 10 mg dose-slowly transition back to her usual dosing in the next few days  Chronic pain syndrome: Appears to be stable-continue Neurontin, Cymbalta and Robaxin.  She will claims that back pain has worsened-but now she seems to be comfortable-she has refused a MRI for the past few days.  See above.    Frequent falls: Claims has fallen twice in the past week or so-appreciate PT evaluation-refuses SNF-will arrange for maximal home health services on discharge  DVT Prophylaxis: Prophylactic Lovenox   Code Status: Full code  Family Communication: None at bedside-have left numerous voice message with patient's daughter-none at bedside today  Disposition Plan: Remain inpatient-await further recommendations from infectious disease-await input from social work-but suspect home on 5/21  Antimicrobial agents: Anti-infectives (From admission, onward)   Start     Dose/Rate Route Frequency Ordered Stop   06/08/17 0600  DAPTOmycin (CUBICIN) 420 mg in sodium chloride 0.9 % IVPB  Status:  Discontinued     420 mg 216.8 mL/hr over 30 Minutes Intravenous Every 24 hours 06/07/17 0249 06/07/17 1351   06/07/17 1800  ciprofloxacin (CIPRO) IVPB 400 mg     400 mg 200 mL/hr over 60 Minutes Intravenous 2 times daily 06/07/17 0249     06/07/17 0215  ciprofloxacin (CIPRO) IVPB 400 mg     400 mg 200 mL/hr over 60 Minutes Intravenous  Once 06/07/17 0203 06/07/17 0400   06/07/17 0215  DAPTOmycin (CUBICIN) 420 mg in sodium chloride 0.9 % IVPB     420 mg 216.8 mL/hr over 30 Minutes Intravenous Every 24 hours 06/07/17 0203        Procedures: 5/19>> ultrasound  aspiration of right atrial collection  CONSULTS:  ID, IR  Time spent: 25- minutes-Greater than 50% of this time was spent in counseling, explanation of diagnosis, planning of further management, and coordination of care.  MEDICATIONS: Scheduled Meds: . aspirin EC  81 mg Oral QPM  . diltiazem  180 mg Oral QHS  . DULoxetine  90 mg Oral Daily  . enoxaparin (LOVENOX) injection  40 mg Subcutaneous Daily  . feeding supplement (PRO-STAT SUGAR FREE 64)  30 mL Oral BID  . furosemide  80 mg Oral Daily  . gabapentin  600 mg Oral Daily   And  . gabapentin  600 mg Oral QHS  . losartan  100 mg Oral Daily  . magnesium oxide  400 mg Oral QPM  . metoprolol succinate  50 mg Oral Daily  . multivitamin with minerals  1 tablet Oral Daily  . pantoprazole  40 mg Oral Daily  . potassium chloride SA  20 mEq Oral BID  . [START ON 06/16/2017] predniSONE  5 mg Oral Q breakfast   Followed by  . predniSONE  10 mg Oral Q breakfast  . vitamin C  500 mg Oral BID   Continuous Infusions: . ciprofloxacin Stopped (06/13/17 0441)  . DAPTOmycin (CUBICIN)  IV Stopped (06/13/17 0441)   PRN Meds:.acetaminophen **OR** acetaminophen, albuterol, HYDROcodone-acetaminophen, LORazepam, methocarbamol, ondansetron **OR** ondansetron (ZOFRAN) IV, polyvinyl alcohol, sodium chloride flush   PHYSICAL EXAM: Vital signs: Vitals:   06/12/17 1956 06/12/17 2341 06/13/17 0609 06/13/17 0955  BP: 118/61 132/70 130/73 121/71  Pulse: 72  79 78  Resp: 18  18 18   Temp: 99.1 F (37.3 C)  98.3 F (36.8 C) 98.8 F (37.1 C)  TempSrc: Oral  Oral Oral  SpO2: 97%  97% 98%  Weight:   67.2 kg (148 lb 2.4 oz)   Height:       Filed Weights   06/11/17 0612 06/12/17 0456 06/13/17 0609  Weight: 68.4 kg (150 lb 12.7 oz) 66.7 kg (147 lb 0.8 oz) 67.2 kg (148 lb 2.4 oz)   Body mass index is 29.92 kg/m.   General appearance:Awake, alert, not in any distress.  Eyes:no scleral icterus. HEENT: Atraumatic and Normocephalic Neck: supple, no  JVD. Resp:Good air entry bilaterally,no rales or rhonchi CVS: S1 S2 regular, no murmurs.  GI: Bowel sounds present, Non tender and not distended with no gaurding, rigidity or rebound. Extremities: B/L Lower Ext shows no edema, both legs are warm to touch Neurology:  Non focal Psychiatric: Normal judgment and insight. Normal mood. Musculoskeletal:No digital cyanosis Skin:No Rash, warm and dry Wounds:N/A  I have personally reviewed following labs and imaging studies  LABORATORY DATA: CBC: Recent Labs  Lab 06/08/17 0405 06/09/17 0422 06/10/17 0412 06/11/17 1036 06/12/17 0346  WBC 1.6* 2.2* 2.6* 3.5* 7.0  NEUTROABS 0.1* 0.0* 0.3* 0.7* 2.2  HGB 8.3* 8.4* 8.1* 9.2* 9.3*  HCT 26.8* 26.6* 26.1* 29.7* 30.2*  MCV 84.5 85.0 83.9 84.4 85.1  PLT 540* 538* 546* 553* 546*    Basic Metabolic Panel: Recent Labs  Lab 06/06/17 2245 06/07/17 0604 06/08/17 0405 06/11/17 1036  NA 139 140 139 142  K 3.2* 3.0* 3.7 3.2*  CL 106 109 106 105  CO2 19* 19* 24 27  GLUCOSE 91 73 126* 111*  BUN  5* 5* 12 20  CREATININE 0.65 0.54 0.61 0.84  CALCIUM 8.7* 8.4* 8.6* 8.7*    GFR: Estimated Creatinine Clearance: 46 mL/min (by C-G formula based on SCr of 0.84 mg/dL).  Liver Function Tests: Recent Labs  Lab 06/06/17 2245 06/07/17 0604  AST 14* 11*  ALT 11* 9*  ALKPHOS 147* 131*  BILITOT 0.8 0.8  PROT 6.0* 5.3*  ALBUMIN 2.6* 2.3*   No results for input(s): LIPASE, AMYLASE in the last 168 hours. No results for input(s): AMMONIA in the last 168 hours.  Coagulation Profile: Recent Labs  Lab 06/06/17 2245  INR 1.19    Cardiac Enzymes: Recent Labs  Lab 06/08/17 0405  CKTOTAL 13*    BNP (last 3 results) No results for input(s): PROBNP in the last 8760 hours.  HbA1C: No results for input(s): HGBA1C in the last 72 hours.  CBG: No results for input(s): GLUCAP in the last 168 hours.  Lipid Profile: No results for input(s): CHOL, HDL, LDLCALC, TRIG, CHOLHDL, LDLDIRECT in the last  72 hours.  Thyroid Function Tests: No results for input(s): TSH, T4TOTAL, FREET4, T3FREE, THYROIDAB in the last 72 hours.  Anemia Panel: No results for input(s): VITAMINB12, FOLATE, FERRITIN, TIBC, IRON, RETICCTPCT in the last 72 hours.  Urine analysis:    Component Value Date/Time   COLORURINE YELLOW 06/06/2017 2245   APPEARANCEUR CLOUDY (A) 06/06/2017 2245   LABSPEC 1.018 06/06/2017 2245   PHURINE 6.0 06/06/2017 2245   GLUCOSEU NEGATIVE 06/06/2017 2245   HGBUR NEGATIVE 06/06/2017 2245   BILIRUBINUR NEGATIVE 06/06/2017 2245   KETONESUR 20 (A) 06/06/2017 2245   PROTEINUR 30 (A) 06/06/2017 2245   UROBILINOGEN 0.2 09/14/2014 1637   NITRITE NEGATIVE 06/06/2017 2245   LEUKOCYTESUR NEGATIVE 06/06/2017 2245    Sepsis Labs: Lactic Acid, Venous    Component Value Date/Time   LATICACIDVEN 1.53 06/06/2017 2251    MICROBIOLOGY: Recent Results (from the past 240 hour(s))  Culture, blood (Routine x 2)     Status: None   Collection Time: 06/07/17  1:05 AM  Result Value Ref Range Status   Specimen Description BLOOD LEFT FOREARM  Final   Special Requests   Final    BOTTLES DRAWN AEROBIC AND ANAEROBIC Blood Culture adequate volume   Culture   Final    NO GROWTH 5 DAYS Performed at [facility], [address], [place], [place] [address]    Report Status 06/12/2017 FINAL  Final  Culture, blood (Routine x 2)     Status: None   Collection Time: 06/07/17  1:10 AM  Result Value Ref Range Status   Specimen Description BLOOD RIGHT WRIST  Final   Special Requests   Final    BOTTLES DRAWN AEROBIC AND ANAEROBIC Blood Culture adequate volume   Culture   Final    NO GROWTH 5 DAYS Performed at [facility], [address] [place], [place] [address]    Report Status 06/12/2017 FINAL  Final  Urine culture     Status: Abnormal   Collection Time: 06/07/17  1:18 AM  Result Value Ref Range Status   Specimen Description URINE, CATHETERIZED  Final   Special Requests   Final     NONE Performed at [facility], [address], [place], [place] [address]    Culture MULTIPLE SPECIES PRESENT, SUGGEST RECOLLECTION (A)  Final   Report Status 06/08/2017 FINAL  Final  MRSA PCR Screening     Status: Abnormal   Collection Time: 06/07/17  9:56 AM  Result Value Ref Range Status   MRSA by PCR POSITIVE (A) NEGATIVE Final    Comment:        The GeneXpert MRSA Assay (FDA approved for NASAL specimens only), is one component of a comprehensive MRSA colonization surveillance program. It is not intended to diagnose MRSA infection nor to guide or monitor treatment for MRSA infections. RESULT CALLED TO, READ BACK BY AND VERIFIED WITH: RN Zebedee Iba [phone] Performed at [facility], [address], [place], [place] [address]   Aerobic/Anaerobic Culture (surgical/deep wound)     Status: None (Preliminary result)   Collection Time: 06/12/17  2:09 PM  Result Value Ref Range Status   Specimen Description ABSCESS ISCHIAL  Final   Special Requests Normal  Final   Gram Stain   Final    FEW WBC PRESENT, PREDOMINANTLY MONONUCLEAR NO ORGANISMS SEEN Performed at [facility], [address], [place], [place] [address]    Culture PENDING  Incomplete   Report Status PENDING  Incomplete    RADIOLOGY STUDIES/RESULTS: Dg Chest 2 View  Result Date: 06/06/2017 CLINICAL DATA:  77 y/o F; 1 week of back pain and confusion with fever. EXAM: CHEST - 2 VIEW COMPARISON:  05/04/2017 chest radiograph FINDINGS: Stable normal cardiac silhouette given projection and technique. Aortic atherosclerosis with calcification. Bronchitic changes in the left lung base. No focal consolidation. No pleural effusion or pneumothorax. 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 Right upper quadrant cholecystectomy clips. IMPRESSION: 1. Bronchitic changes in the left lung base. No focal consolidation. 2. Aortic  atherosclerosis. Electronically Signed   By: Mitzi Hansen M.D.   On: 06/06/2017 23:36   Ct Head Wo Contrast  Result Date: 06/07/2017 CLINICAL DATA:  77 y/o F; fever and back pain. Altered mental status. EXAM: CT HEAD WITHOUT CONTRAST TECHNIQUE: Contiguous axial images were obtained from the base of the skull through the vertex without intravenous contrast. COMPARISON:  09/14/2014 CT head FINDINGS: Brain: No evidence of acute infarction, hemorrhage, hydrocephalus, extra-axial collection or mass lesion/mass effect. Stable chronic microvascular ischemic changes and parenchymal volume loss of the brain. Vascular: No hyperdense vessel or unexpected calcification. Skull: Normal. Negative for fracture or focal lesion. Sinuses/Orbits: No acute finding. Other: None. IMPRESSION: 1. No acute intracranial abnormality identified. 2. Stable chronic microvascular ischemic changes and parenchymal volume loss of the brain. Electronically Signed   By: Mitzi Hansen M.D.   On: 06/07/2017 05:20   Ct Abdomen Pelvis W Contrast  Result Date: 06/11/2017 CLINICAL DATA:  Abdominal pain, fever, abscess suspected. EXAM: CT ABDOMEN AND PELVIS WITH CONTRAST TECHNIQUE: Multidetector CT imaging of the abdomen and pelvis was performed using the standard protocol following bolus administration of intravenous contrast. CONTRAST:  OMNIPAQUE IOHEXOL 300 MG/ML  SOLN COMPARISON:  CT 04/29/2017 and pelvic MRI 05/02/2017 FINDINGS: Lower chest: Top-normal size heart with dependent bibasilar atelectasis. No pulmonary consolidation or effusion. Hepatobiliary: Cholecystectomy. No space-occupying mass of the liver. Mild fatty infiltration along the falciform ligament as before. Pancreas: No inflammation, mass or ductal dilatation. Spleen: Normal size spleen without mass. Adrenals/Urinary Tract: Normal bilateral adrenal glands. Stable exophytic cyst arising from the posterior aspect of the right upper pole of the kidney  measuring 8 mm compatible with a cyst. This is too small to further characterize. No hydroureteronephrosis. Normal bladder. Stomach/Bowel: Colonic diverticulosis of the sigmoid colon without acute diverticulitis. No bowel obstruction. The stomach and colon are  nonacute. Vascular/Lymphatic: Aortoiliac atherosclerosis.  No adenopathy. Reproductive: Status post hysterectomy. No adnexal masses. Other: No supplemental non categorized findings. Musculoskeletal: Reaccumulation of fluid adjacent to the right ischial tuberosity now measuring 4.7 x 4.3 cm. On prior MRI, this collection had an involuted appearance with crenulated margins. This is no longer the case where reaccumulation of fluid gives this finding a more distended ovoid appearance though overall the dimensions are slightly smaller than on prior exams. Stable right greater trochanteric bursal fluid measuring 3.4 x 1.6 cm. Stable shaved or truncated appearance of the right posterior ischial tuberosity. Lower thoracic and lumbar spondylosis with multilevel degenerative disc disease. Posterolateral rod and pedicle screw fixation from L3 through L5. Chronic grade 1 anterolisthesis of L4 on L5 with interbody fusion at L3-4. Degenerative disc disease L2-3 and T12-L1. Stable endplate irregularities of the lower thoracic spine and at L1-2. Periumbilical fat containing ventral hernias. IMPRESSION: 1. Reaccumulation of fluid adjacent to the right ischial tuberosity not measuring 4.7 x 4.3 cm. Potential evolving abscess is not excluded. 2. Stable right greater trochanteric bursal fluid measuring 3.4 x 1.6 cm. 3. Thoracolumbar spondylosis with lumbar fixation hardware as before. Electronically Signed   By: Tollie Eth M.D.   On: 06/11/2017 03:28   Dg Foot Complete Right  Result Date: 06/07/2017 CLINICAL DATA:  77 y/o F; wound on the bottom of the heel. History of ankle fusion 2017. EXAM: RIGHT FOOT COMPLETE - 3+ VIEW COMPARISON:  07/12/2016 right ankle radiograph  FINDINGS: There is a rod fixing the ankle joint with a transversely oriented interlocking screw through the calcaneus. Mild increase in periprosthetic lucency surrounding the rod and interlocking screw within the calcaneus and lower tibia. Severe degenerative changes of the tibiotalar joint with flattening of talar dome. Pes planus. Bones are diffusely demineralized and there is vascular calcification. Soft tissue swelling of the heel, no bony destructive change to suggest osteomyelitis. IMPRESSION: 1. No findings of osteomyelitis. 2. Mildly increased periprosthetic lucency along the rod traversing ankle fusion and interlocking screw within the calcaneus probably representing hardware loosening. Electronically Signed   By: Mitzi Hansen M.D.   On: 06/07/2017 03:45     LOS: 6 days   Jeoffrey Massed, MD  [facility]  If 7PM-7AM, please contact night-coverage [email or web address] Password [facility] 06/13/2017, 10:29 AM

## 2017-06-13 NOTE — Progress Notes (Signed)
[facility]    Date of Admission:  06/06/2017      ID: Andrea Yu is a 77 y.o. 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 WBC returned back to her baseline.   Medications:  . alteplase  2 mg Intracatheter Once  . aspirin EC  81 mg Oral QPM  . diltiazem  180 mg Oral QHS  . DULoxetine  90 mg Oral Daily  . enoxaparin (LOVENOX) injection  40 mg Subcutaneous Daily  . feeding supplement (PRO-STAT SUGAR FREE 64)  30 mL Oral BID  . furosemide  80 mg Oral Daily  . gabapentin  600 mg Oral Daily   And  . gabapentin  600 mg Oral QHS  . losartan  100 mg Oral Daily  . magnesium oxide  400 mg Oral QPM  . metoprolol succinate  50 mg Oral Daily  . multivitamin with minerals  1 tablet Oral Daily  . pantoprazole  40 mg Oral Daily  . potassium chloride SA  20 mEq Oral BID  . [START ON 06/16/2017] predniSONE  5 mg Oral Q breakfast   Followed by  . predniSONE  10 mg Oral Q breakfast  . vitamin C  500 mg Oral BID    Objective: Vital signs in last 24 hours: Temp:  [98 F (36.7 C)-99.1 F (37.3 C)] 98.5 F (36.9 C) (05/20 1200) Pulse Rate:  [72-84] 82 (05/20 1200) Resp:  [18] 18 (05/20 1200) BP: (118-145)/(61-73) 145/72 (05/20  1200) SpO2:  [97 %-98 %] 98 % (05/20 1200) Weight:  [148 lb 2.4 oz (67.2 kg)] 148 lb 2.4 oz (67.2 kg) (05/20 6378) Physical Exam  Constitutional:  oriented to person, place, and time. appears well-developed and well-nourished. No distress.  HENT: [place]/AT, PERRLA, no scleral icterus Mouth/Throat: Oropharynx is clear and moist. No oropharyngeal exudate.  Cardiovascular: Normal rate, regular rhythm and normal heart sounds. Exam reveals no gallop and no friction rub.  No murmur heard.  Pulmonary/Chest: Effort normal and breath sounds normal. No respiratory distress.  has no wheezes.  Neck = supple, no nuchal rigidity Abdominal: Soft. Bowel sounds are normal.  exhibits no distension. There is no tenderness.  Lymphadenopathy: no cervical adenopathy. No axillary adenopathy Neurological: alert and oriented to person, place, and time.  Skin: frail skin with scattered skin tears and echymosis Ext : right picc lie is c/d/i Psychiatric: a normal mood and affect.  behavior is normal.    Lab Results Recent Labs    06/11/17 1036 06/12/17 0346  WBC 3.5* 7.0  HGB 9.2* 9.3*  HCT 29.7* 30.2*  NA 142  --   K 3.2*  --   CL 105  --   CO2 27  --   BUN 20  --   CREATININE 0.84  --    Lab Results  Component  Value Date   ESRSEDRATE 19 05/12/2017   Lab Results  Component Value Date   CRP 5.7 (H) 04/29/2017    Microbiology: 5/14 blood cx ngtd 5/19 aspirate pending Studies/Results: No results found. april Bursae: Right trochanteric bursal fluid collection with mild complexity measuring 6.5 by 5.8 by 1.9 cm (volume = 38 cm^3).  Complex fluid collection extending posterior and inferior to the right ischium in the subcutaneous tissues measuring 5.4 by 5.1 by 6.6 cm (volume = 95 cm^3). Both of these collections have some accentuated T1 signal  May:  Stable right greater trochanteric bursal fluid measuring 3.4 x 1.6 cm. Stable shaved or truncated appearance of the right posterior  ischial Tuberosity.   Reaccumulation of fluid adjacent to the right ischial tuberosity now measuring 4.7 x 4.3 cm. On prior MRI, this collection had an involuted appearance with crenulated margins. This is no longer the case where reaccumulation of fluid gives this finding a more distended ovoid appearance though overall the dimensions are slightly smaller than on prior exams.   Assessment/Plan: Neutropenia from drug effect (Vanco vs. CTX) = improved  Group b strep discitis = continue on daptomycin until 5/31  Ischial abscess = as noted in 3/24 imaging. Has been on extended abtx of gram positive coverage (dapto) plus gram negative (cipro) during this hospitalization. Await cx results from aspirate on 5/19 to see if need to extend beyond 5/31. Will check sed rate and crp.   [facility] Cell: [phone] Pager: [phone]  06/13/2017, 2:40 PM

## 2017-06-13 NOTE — Clinical Social Work Note (Signed)
CSW acknowledges SNF consult. CSW met with patient last week. She is in her copay days and cannot afford to pay approximately $160 per day if she returns to SNF.  CSW signing off. Consult again if any other social work needs arise.  Dayton Scrape, [place]

## 2017-06-13 NOTE — Plan of Care (Signed)
?  Problem: Elimination: ?Goal: Will not experience complications related to urinary retention ?Outcome: Progressing ?  ?

## 2017-06-13 NOTE — Progress Notes (Signed)
Verbal order received from Tama Gander, NP to D/C PICC. IV team paged to address. Will continue to monitor patient.

## 2017-06-13 NOTE — Progress Notes (Signed)
IV team notified primary Rn patient's PICC has no blood return after 2 doses of activase. Tama Gander, NP notified. Awaiting orders. Will continue to monitor patient.

## 2017-06-13 NOTE — [place] FL2 (Signed)
[place] MEDICAID FL2 LEVEL OF CARE SCREENING TOOL     IDENTIFICATION  Patient Name: FRUMA AFRICA Birthdate: [birth year] Sex: female Admission Date (Current Location): 06/06/2017  [facility] and [place] Number:  Producer, television/film/video and Address:  [facility], [address] [place], [place] [address]      Provider Number: [id number]  Attending Physician Name and Address:  Maretta Bees, MD  Relative Name and Phone Number:       Current Level of Care: Hospital Recommended Level of Care: Skilled Nursing Facility Prior Approval Number:    Date Approved/Denied:   PASRR Number: [id number]  Discharge Plan: SNF    Current Diagnoses: Patient Active Problem List   Diagnosis Date Noted  . Febrile neutropenia (HCC) 06/07/2017  . Discitis of thoracic region 05/12/2017  . Medication monitoring encounter 05/12/2017  . Hx of adverse drug reaction   . SIRS (systemic inflammatory response syndrome) (HCC) 04/30/2017  . Sepsis due to group B Streptococcus (HCC) 04/19/2017  . Stage I pressure ulcer of sacral region 04/19/2017  . Altered mental status 04/17/2017  . Hip osteomyelitis, right (HCC) 04/17/2017  . Pressure injury of skin 04/17/2017  . History of blood transfusion   . Head injury, closed, with concussion   . GERD (gastroesophageal reflux disease)   . Dysrhythmia   . Complication of anesthesia   . Arthritis   . Anemia   . Displaced spiral fracture of shaft of right tibia, subsequent encounter for closed fracture with delayed healing 11/11/2016  . 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 Osteomyelitis (HCC)   . SAH (subarachnoid hemorrhage) (HCC) 09/14/2014  . Atrial fibrillation with RVR (HCC) 09/14/2014  . Hypertension 09/14/2014  . Laceration of eyebrow, left 09/14/2014  . Fall  09/14/2014  . Gastritis, acute 11/11/2012  . Cellulitis of leg, right 11/11/2012  . Obesity (BMI 30-39.9)- suspected sleep apnea, declines sleep study 11/10/2012  . Psoriatic arthritis (HCC) 11/06/2012  . Iron deficiency anemia- transfused this admission 11/06/2012  . Depression 11/06/2012  . Peripheral neuropathy 11/06/2012  . Shock circulatory on admission 11/06/12 11/06/2012  . Systemic inflammatory response syndrome (SIRS) (HCC) 11/06/2012  . Nausea and vomiting 08/09/2012  . Adrenal insufficiency (HCC) 08/09/2012  . PAF (paroxysmal atrial fibrillation) (HCC) 08/09/2012  . Hypotension 08/09/2012  . Dehydration 08/09/2012    Orientation RESPIRATION BLADDER Height & Weight     Self, Time, Situation, Place  Normal Incontinent, External catheter Weight: 148 lb 2.4 oz (67.2 kg) Height:  4\' 11"  (149.9 cm)  BEHAVIORAL SYMPTOMS/MOOD NEUROLOGICAL BOWEL NUTRITION STATUS  (None) (None) Continent Diet(Heart healthy)  AMBULATORY STATUS COMMUNICATION OF NEEDS Skin   Extensive Assist Verbally Skin abrasions, Bruising, Other (Comment), PU Stage and Appropriate Care, Surgical wounds(MASD, Skin tear.)   PU Stage 2 Dressing: (Right heel: Foam.)                   Personal Care Assistance Level of Assistance  Bathing, Feeding, Dressing Bathing Assistance: Limited assistance Feeding assistance: Limited assistance Dressing Assistance: Limited assistance     Functional Limitations Info  Sight, Hearing, Speech Sight Info: Adequate Hearing Info: Adequate Speech Info: Adequate    SPECIAL CARE FACTORS FREQUENCY  PT (By licensed PT), Blood pressure, OT (By licensed OT)     PT Frequency: 5 x week OT Frequency: 5  x week            Contractures Contractures Info: Not present    Additional Factors Info  Code Status, Allergies, Isolation Precautions Code Status Info: Full Allergies Info: Penicillins, Prolia (Denosumab), Ceftriaxone, Fosamax (Alendronate Sodium).     Isolation  Precautions Info: Contact: MRSA     Current Medications (06/13/2017):  This is the current hospital active medication list Current Facility-Administered Medications  Medication Dose Route Frequency Provider Last Rate Last Dose  . acetaminophen (TYLENOL) tablet 650 mg  650 mg Oral Q6H PRN Eduard Clos, MD       Or  . acetaminophen (TYLENOL) suppository 650 mg  650 mg Rectal Q6H PRN Eduard Clos, MD      . albuterol (PROVENTIL) (2.5 MG/3ML) 0.083% nebulizer solution 2.5 mg  2.5 mg Inhalation Q6H PRN Eduard Clos, MD      . alteplase (CATHFLO ACTIVASE) injection 2 mg  2 mg Intracatheter Once Maretta Bees, MD      . aspirin EC tablet 81 mg  81 mg Oral QPM Eduard Clos, MD   81 mg at 06/12/17 1700  . ciprofloxacin (CIPRO) IVPB 400 mg  400 mg Intravenous BID Eduard Clos, MD   Stopped at 06/13/17 0441  . DAPTOmycin (CUBICIN) 420 mg in sodium chloride 0.9 % IVPB  420 mg Intravenous Q24H Eduard Clos, MD   Stopped at 06/13/17 0441  . diltiazem (CARDIZEM CD) 24 hr capsule 180 mg  180 mg Oral QHS Eduard Clos, MD   180 mg at 06/12/17 2342  . DULoxetine (CYMBALTA) DR capsule 90 mg  90 mg Oral Daily Eduard Clos, MD   90 mg at 06/13/17 0957  . enoxaparin (LOVENOX) injection 40 mg  40 mg Subcutaneous Daily Eduard Clos, MD   40 mg at 06/13/17 0959  . feeding supplement (PRO-STAT SUGAR FREE 64) liquid 30 mL  30 mL Oral BID Maretta Bees, MD   30 mL at 06/13/17 0959  . furosemide (LASIX) tablet 80 mg  80 mg Oral Daily Eduard Clos, MD   80 mg at 06/13/17 0957  . gabapentin (NEURONTIN) tablet 600 mg  600 mg Oral Daily Eduard Clos, MD   600 mg at 06/13/17 6948   And  . gabapentin (NEURONTIN) tablet 600 mg  600 mg Oral QHS Eduard Clos, MD   600 mg at 06/12/17 2343  . HYDROcodone-acetaminophen (NORCO/VICODIN) 5-325 MG per tablet 1-2 tablet  1-2 tablet Oral Q4H PRN Eduard Clos, MD      . LORazepam  (ATIVAN) injection 1 mg  1 mg Intravenous Once PRN Maretta Bees, MD      . losartan (COZAAR) tablet 100 mg  100 mg Oral Daily Eduard Clos, MD   100 mg at 06/13/17 5462  . magnesium oxide (MAG-OX) tablet 400 mg  400 mg Oral QPM Eduard Clos, MD   400 mg at 06/12/17 1700  . methocarbamol (ROBAXIN) tablet 500 mg  500 mg Oral Q8H PRN Eduard Clos, MD   500 mg at 06/12/17 2343  . metoprolol succinate (TOPROL-XL) 24 hr tablet 50 mg  50 mg Oral Daily Eduard Clos, MD   50 mg at 06/13/17 0959  . multivitamin with minerals tablet 1 tablet  1 tablet Oral Daily Eduard Clos, MD   1 tablet at 06/13/17 [phone]  . ondansetron (ZOFRAN) tablet 4 mg  4 mg Oral Q6H PRN Eduard Clos,  MD       Or  . ondansetron (ZOFRAN) injection 4 mg  4 mg Intravenous Q6H PRN Eduard Clos, MD      . pantoprazole (PROTONIX) EC tablet 40 mg  40 mg Oral Daily Eduard Clos, MD   40 mg at 06/13/17 0959  . polyvinyl alcohol (LIQUIFILM TEARS) 1.4 % ophthalmic solution 1 drop  1 drop Both Eyes TID PRN Eduard Clos, MD      . potassium chloride SA (K-DUR,KLOR-CON) CR tablet 20 mEq  20 mEq Oral BID Eduard Clos, MD   20 mEq at 06/13/17 0958  . [START ON 06/16/2017] predniSONE (DELTASONE) tablet 5 mg  5 mg Oral Q breakfast Ghimire, Werner Lean, MD       Followed by  . predniSONE (DELTASONE) tablet 10 mg  10 mg Oral Q breakfast Maretta Bees, MD   10 mg at 06/13/17 0556  . sodium chloride flush (NS) 0.9 % injection 10-40 mL  10-40 mL Intracatheter PRN Maretta Bees, MD   20 mL at 06/09/17 0429  . vitamin C (ASCORBIC ACID) tablet 500 mg  500 mg Oral BID Eduard Clos, MD   500 mg at 06/13/17 6578     Discharge Medications: Please see discharge summary for a list of discharge medications.  Relevant Imaging Results:  Relevant Lab Results:   Additional Information SS#: [id number]. Recently at [employer]. Used 31 days.  Margarito Liner, LCSW

## 2017-06-14 LAB — CBC WITH DIFFERENTIAL/PLATELET
BASOS ABS: 0 10*3/uL (ref 0.0–0.1)
Basophils Relative: 0 %
Eosinophils Absolute: 0 10*3/uL (ref 0.0–0.7)
Eosinophils Relative: 0 %
HCT: 36.5 % (ref 36.0–46.0)
HEMOGLOBIN: 11.1 g/dL — AB (ref 12.0–15.0)
LYMPHS PCT: 26 %
Lymphs Abs: 4.2 10*3/uL — ABNORMAL HIGH (ref 0.7–4.0)
MCH: 26.1 pg (ref 26.0–34.0)
MCHC: 30.4 g/dL (ref 30.0–36.0)
MCV: 85.9 fL (ref 78.0–100.0)
MONOS PCT: 15 %
Monocytes Absolute: 2.4 10*3/uL — ABNORMAL HIGH (ref 0.1–1.0)
Neutro Abs: 9.7 10*3/uL — ABNORMAL HIGH (ref 1.7–7.7)
Neutrophils Relative %: 59 %
PLATELETS: 584 10*3/uL — AB (ref 150–400)
RBC: 4.25 MIL/uL (ref 3.87–5.11)
RDW: 17.3 % — ABNORMAL HIGH (ref 11.5–15.5)
WBC: 16.3 10*3/uL — ABNORMAL HIGH (ref 4.0–10.5)

## 2017-06-14 LAB — CREATININE, SERUM
Creatinine, Ser: 0.94 mg/dL (ref 0.44–1.00)
GFR calc non Af Amer: 57 mL/min — ABNORMAL LOW (ref >60)

## 2017-06-14 MED ORDER — CIPROFLOXACIN HCL 500 MG PO TABS: 500.0000 mg | ORAL_TABLET | Freq: Two times a day (BID) | ORAL | 0 refills | Status: AC

## 2017-06-14 MED ORDER — PRO-STAT SUGAR FREE PO LIQD: 30.0000 mL | Freq: Two times a day (BID) | ORAL | 0 refills | Status: DC

## 2017-06-14 MED ORDER — HYDROCODONE-ACETAMINOPHEN 5-325 MG PO TABS: 1.0000 | ORAL_TABLET | Freq: Four times a day (QID) | ORAL | 0 refills | Status: DC | PRN

## 2017-06-14 MED ORDER — PREDNISONE 5 MG PO TABS: 5.0000 mg | ORAL_TABLET | Freq: Every day | ORAL | Status: DC

## 2017-06-14 NOTE — Clinical Social Work Placement (Signed)
   CLINICAL SOCIAL WORK PLACEMENT  NOTE  Date:  06/14/2017  Patient Details  Name: Andrea Yu MRN: [id number] Date of Birth: Jan 31, 1940  Clinical Social Work is seeking post-discharge placement for this patient at the Skilled  Nursing Facility level of care (*CSW will initial, date and re-position this form in  chart as items are completed):  Yes   Patient/family provided with [place] Clinical Social Work Department's list of facilities offering this level of care within the geographic area requested by the patient (or if unable, by the patient's family).  Yes   Patient/family informed of their freedom to choose among providers that offer the needed level of care, that participate in Medicare, Medicaid or managed care program needed by the patient, have an available bed and are willing to accept the patient.  Yes   Patient/family informed of [facility]'s ownership interest in [facility] and [facility], as well as of the fact that they are under no obligation to receive care at these facilities.  PASRR submitted to EDS on 06/13/17     PASRR number received on       Existing PASRR number confirmed on 06/13/17     FL2 transmitted to all facilities in geographic area requested by pt/family on 06/13/17     FL2 transmitted to all facilities within larger geographic area on       Patient informed that his/her managed care company has contracts with or will negotiate with certain facilities, including the following:        Yes   Patient/family informed of bed offers received.  Patient chooses bed at [facility]     Physician recommends and patient chooses bed at      Patient to be transferred to [facility] on 06/14/17.  Patient to be transferred to facility by [facility]     Patient family notified on 06/14/17 of transfer.  Name of family member notified:  Left voicemail for daughter, Nihira Puello     PHYSICIAN Please prepare prescriptions       Additional Comment:    _______________________________________________ Margarito Liner, LCSW 06/14/2017, 1:50 PM

## 2017-06-14 NOTE — Progress Notes (Signed)
[facility]    Date of Admission:  06/06/2017      ID: Andrea Yu is a 77 y.o. 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 Awaiting placement at SNF   Medications:  . aspirin EC  81 mg Oral QPM  . diltiazem  180 mg Oral QHS  . DULoxetine  90 mg Oral Daily  . enoxaparin (LOVENOX) injection  40 mg Subcutaneous Daily  . feeding supplement (PRO-STAT SUGAR FREE 64)  30 mL Oral BID  . furosemide  80 mg Oral Daily  . gabapentin  600 mg Oral Daily   And  . gabapentin  600 mg Oral QHS  . losartan  100 mg Oral Daily  . magnesium oxide  400 mg Oral QPM  . metoprolol succinate  50 mg Oral Daily  . multivitamin with minerals  1 tablet Oral Daily  . pantoprazole  40 mg Oral Daily  . potassium chloride SA  20 mEq Oral BID  . [START ON 06/16/2017] predniSONE  5 mg Oral Q breakfast   Followed by  . predniSONE  10 mg Oral Q breakfast  . vitamin C  500 mg Oral BID    Objective: Vital signs in last 24 hours: Temp:  [97.6 F (36.4 C)-98.6 F (37 C)] 97.6 F (36.4 C) (05/21 0806) Pulse Rate:  [64-97] 73 (05/21 0806) Resp:  [18] 18 (05/21 0806) BP: (99-145)/(64-75) 99/75 (05/21 0806) SpO2:  [96 %-100 %] 98 % (05/21 0806) Weight:  [144 lb 10 oz (65.6 kg)] 144  lb 10 oz (65.6 kg) (05/21 0518) Physical Exam  Constitutional:  oriented to person, place, and time. appears well-developed and well-nourished. No distress.  HENT: [place]/AT, PERRLA, no scleral icterus Mouth/Throat: Oropharynx is clear and moist. No oropharyngeal exudate.  Cardiovascular: nls s1,s2, no g/m/r Pulmonary/Chest: cTAB no w/c/r Lymphadenopathy: no cervical adenopathy. No axillary adenopathy Neurological: alert and oriented to person, place, and time.  Skin: frail skin with scattered skin tears and echymosis    Lab Results Recent Labs    06/12/17 0346 06/14/17 0523  WBC 7.0 16.3*  HGB 9.3* 11.1*  HCT 30.2* 36.5  CREATININE  --  0.94   Lab Results  Component Value Date   ESRSEDRATE 35 (H) 06/13/2017   Lab Results  Component Value Date   CRP 1.3 (H) 06/13/2017    Microbiology: 5/14 blood cx ngtd 5/19 aspirate pending Studies/Results: US Guided Needle Placement  Result Date: 06/14/2017 CLINICAL DATA:  Recurrent subcutaneous complex collection adjacent to the right ischium. EXAM: IR ULTRASOUND GUIDED ASPIRATION/DRAINAGE COMPARISON:  CT 06/10/2017 TECHNIQUE: The procedure, risks (including but not limited to bleeding, infection, organ damage ), benefits, and alternatives were explained to the patient. Questions regarding the procedure were encouraged and answered. The patient understands and consents to the procedure. Survey ultrasound of the right gluteal region was  performed and a complex subcutaneous fluid collection was identified. Overlying skin prepped with chlorhexidine, draped in usual sterile fashion, infiltrated locally with 1% lidocaine. An 18-gauge spinal needle was advanced into the collection under ultrasound guidance. Approximately 5 mL dark serosanguineous fluid were aspirated, sent for the requested laboratory studies. The patient tolerated the procedure well. COMPLICATIONS: None immediate IMPRESSION: 1. Technically successful aspiration of right gluteal  subcutaneous collection Electronically Signed   By: Corlis Leak M.D.   On: 06/14/2017 08:28   april Bursae: Right trochanteric bursal fluid collection with mild complexity measuring 6.5 by 5.8 by 1.9 cm (volume = 38 cm^3).  Complex fluid collection extending posterior and inferior to the right ischium in the subcutaneous tissues measuring 5.4 by 5.1 by 6.6 cm (volume = 95 cm^3). Both of these collections have some accentuated T1 signal  May:  Stable right greater trochanteric bursal fluid measuring 3.4 x 1.6 cm. Stable shaved or truncated appearance of the right posterior ischial Tuberosity.   Reaccumulation of fluid adjacent to the right ischial tuberosity now measuring 4.7 x 4.3 cm. On prior MRI, this collection had an involuted appearance with crenulated margins. This is no longer the case where reaccumulation of fluid gives this finding a more distended ovoid appearance though overall the dimensions are slightly smaller than on prior exams.   Lab Results  Component Value Date   ESRSEDRATE 35 (H) 06/13/2017   Lab Results  Component Value Date   CRP 1.3 (H) 06/13/2017     Assessment/Plan: Neutropenia from drug effect (Vanco vs. CTX) = improved now has leukocytosis, likely from stress dose steroids  Group b strep discitis = continue on daptomycin until she discharges. She is nearing 8 wks of treatment.   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 Has been on extended abtx of gram positive coverage (dapto) plus gram negative (cipro) during this hospitalization. Await cx results from aspirate on 5/19 thus far are negative.  For discharge would give her cipro 500mg  PO BID x addn 10 days. She has multiple drug allergies/ drug side effects. This would give GNR coverage some streptococcal coverage. We will see her in clinic in 2 wk for follow up.  Psoriatic arthritis = improved with steroids of late  [facility] for Infectious Diseases Cell:  [phone] Pager: [phone]  06/14/2017, 11:34 AM

## 2017-06-14 NOTE — Clinical Social Work Note (Signed)
CSW facilitated patient discharge including contacting patient family (left voicemail for daughter, Clydie Braun) and facility to confirm patient discharge plans. Clinical information faxed to facility and family agreeable with plan. CSW arranged ambulance transport via [facility] to [facility] at 3:00 pm. RN to call report prior to discharge [phone] Room 401B).  CSW will sign off for now as social work intervention is no longer needed. Please consult [place] again if new needs arise.  Charlynn Court, CSW [phone]

## 2017-06-14 NOTE — Discharge Summary (Signed)
PATIENT DETAILS Name: Andrea Yu Age: 77 y.o. Sex: female Date of Birth: September 24, 1940 MRN: [id number]. Admitting Physician: Eduard Clos, MD [id number], Reuel Boom, MD  Admit Date: 06/06/2017 Discharge date: 06/14/2017  Recommendations for Outpatient Follow-up:  1. Follow up with PCP in 1-2 weeks 2. Please obtain BMP/CBC in one week 3. Please ensure follow-up with infectious disease 4. Follow right ischial abscess cultures until final  Admitted From:  Home  Disposition: SNF   Home Health: No  Equipment/Devices: None  Discharge Condition: Stable  CODE STATUS: FULL CODE  Diet recommendation:  Heart Healthy   Brief Summary: See H&P, Labs, Consult and Test reports for all details in brief,Patient is a 77 y.o. female with history of PAF not on long-term anticoagulation, psoriatic arthritis on chronic steroids recent history of group B streptococcus bacteremia and T-spine discitis-on outpatient antimicrobial therapy with Rocephin which was then changed to vancomycin-presenting with confusion, fever and worsening back pain.  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  Patient refused a MRI of her back, however a CT scan of the abdomen/pelvis on 5/17 showed a possible abscess near the right ischial tuberosity, she underwent ultrasound-guided aspiration on 5/19 and cultures are negative so far.  Initially was on daptomycin and ciprofloxacin during this hospital stay, after discussion with infectious disease, recommendations are to complete another 10 days of ciprofloxacin.  Will need follow-up with infectious disease in the outpatient setting..    Acute metabolic encephalopathy: Resolved-likely secondary to above.  Neutropenia: Markedly improved, thought to be secondary to either vancomycin or  Rocephin.    LDH/vitamin B12/folate all within normal limits.  HIV negative.  Had briefly discussed case with hematology-Dr. Mosetta Putt on  5/16-recommendations were to provide supportive care and await improvement.  Since it has improved-doubt further work-up is required.  By the day of discharge-has elevated white count likely secondary to stress steroids.  Anemia: Suspected anemia of chronic disease, hemoglobin stable-continue to follow periodically.   Thrombocytosis: Reactive secondary to ongoing inflammation-continue to follow.  .    A. fib with RVR: Controlled-continue with metoprolol and Cardizem.  Reviewed prior notes in epic-not a candidate for oral anticoagulation due to history of bleeding and significant fall risk.    Hypertension: Controlled with Lasix, losartan and Cardizem.   Psoriatic arthritis: Stable-initially on stress dose hydrocortisone-has now been transitioned back to her usual dosing of prednisone on discharge.   Chronic pain syndrome: Appears to be stable-continue Neurontin, Cymbalta and Robaxin.  She will claims that back pain has worsened-but now she seems to be comfortable-she has refused a MRI for the past few days.  See above.    Frequent falls: Claims has fallen twice in the past week or so-appreciate PT evaluation-r really refused SNF-but by day of discharge was agreeable.  Stable for discharge to SNF.    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)   Febrile neutropenia (HCC)   Discharge Instructions:  Activity:  As tolerated with Full fall precautions use walker/cane & assistance as needed  Discharge Instructions    Call MD for:  persistant nausea and vomiting   Complete by:  As  directed  Call MD for:  severe uncontrolled pain   Complete by:  As directed    Diet - low sodium heart healthy   Complete by:  As directed    Increase activity slowly   Complete by:  As directed      Allergies as of 06/14/2017      Reactions   Penicillins Anaphylaxis   THROAT SWELLING Has patient had a PCN reaction causing immediate rash, facial/tongue/throat swelling, SOB or lightheadedness with hypotension:  # # YES # #  Has patient had a PCN reaction causing severe rash involving mucus membranes or skin necrosis: # # # YES # # # Has patient had a PCN reaction that required hospitalization:No Has patient had a PCN reaction occurring within the last 10 years:  # # YES # #  If all of the above answers are "NO", then may proceed with Cephalosporin use.   Prolia [denosumab] Other (See Comments)   Severe Pain in the groin area.   Ceftriaxone    Possible cause of neutropenia   Fosamax [alendronate Sodium]    UNSPECIFIED REACTION       Medication List    STOP taking these medications   cefTRIAXone IVPB Commonly known as:  ROCEPHIN   ondansetron 4 MG tablet Commonly known as:  ZOFRAN     TAKE these medications   acetaminophen 325 MG tablet Commonly known as:  TYLENOL Take 2 tablets (650 mg total) by mouth every 4 (four) hours as needed. What changed:    how much to take  when to take this  reasons to take this   acetaminophen 325 MG tablet Commonly known as:  TYLENOL Take 2 tablets (650 mg total) by mouth every 6 (six) hours as needed for mild pain (or Fever >/= 101). What changed:  Another medication with the same name was changed. Make sure you understand how and when to take each.   aspirin EC 81 MG tablet Take 81 mg by mouth every evening.   ciprofloxacin 500 MG tablet Commonly known as:  CIPRO Take 1 tablet (500 mg total) by mouth 2 (two) times daily for 10 days.   DILT-XR 180 MG 24 hr capsule Generic drug:  diltiazem Take 180 mg by mouth at bedtime.     DULoxetine 30 MG capsule Commonly known as:  CYMBALTA Take 90 mg by mouth daily. Take along with 60 mg capsule=90 mg   DULoxetine 60 MG capsule Commonly known as:  CYMBALTA Take 90 mg by mouth daily. Take along with 30mg  capsule to make a total of 90mg    feeding supplement (PRO-STAT SUGAR FREE 64) Liqd Take 30 mLs by mouth 2 (two) times daily.   FORTEO 600 MCG/2.4ML Soln Generic drug:  Teriparatide (Recombinant) Inject 20 mcg into the skin daily.   furosemide 80 MG tablet Commonly known as:  LASIX Take 80 mg by mouth.   gabapentin 600 MG tablet Commonly known as:  NEURONTIN Take 600-1,200 mg by mouth 2 (two) times daily. 1 tablet in morning and 2 tablets at night   HYDROcodone-acetaminophen 5-325 MG tablet Commonly known as:  NORCO/VICODIN Take 1-2 tablets by mouth every 6 (six) hours as needed for moderate pain or severe pain. What changed:  when to take this   hydroxypropyl methylcellulose / hypromellose 2.5 % ophthalmic solution Commonly known as:  ISOPTO TEARS / GONIOVISC Place 1 drop into both eyes 3 (three) times daily as needed for dry eyes.   losartan 100 MG tablet Commonly known as:  COZAAR  Take 100 mg by mouth daily.   Magnesium 250 MG Tabs Take 1 tablet by mouth every evening.   methocarbamol 500 MG tablet Commonly known as:  ROBAXIN Take 1 tablet (500 mg total) by mouth every 8 (eight) hours as needed for muscle spasms.   metoprolol succinate 50 MG 24 hr tablet Commonly known as:  TOPROL-XL Take 1 tablet (50 mg total) by mouth daily. Take with or immediately following a meal.   multivitamin with minerals Tabs tablet Take 1 tablet by mouth daily.   omeprazole 20 MG capsule Commonly known as:  PRILOSEC Take 20 mg by mouth every evening.   OTEZLA 30 MG Tabs Generic drug:  Apremilast Take 30 mg by mouth 2 (two) times daily.   potassium chloride SA 20 MEQ tablet Commonly known as:  K-DUR,KLOR-CON Take 20 mEq by mouth 2 (two) times daily.    predniSONE 5 MG tablet Commonly known as:  DELTASONE Take 1 tablet (5 mg total) by mouth daily with breakfast.   PROAIR HFA 108 (90 Base) MCG/ACT inhaler Generic drug:  albuterol Inhale 1-2 puffs into the lungs every 6 (six) hours as needed for wheezing or shortness of breath.   vitamin C 500 MG tablet Commonly known as:  ASCORBIC ACID Take 500 mg by mouth 2 (two) times daily.   Vitamin D (Ergocalciferol) 50000 units Caps capsule Commonly known as:  DRISDOL Take 50,000 Units by mouth every Tuesday.       Contact information for follow-up providers    Jarome Matin, MD. Schedule an appointment as soon as possible for a visit in 1 week(s).   Specialty:  Internal Medicine Contact information: [address] [place] [address] [phone]        Thurmon Fair, MD .   Specialty:  Cardiology Contact information: [address] [place] [address] [phone]        Judyann Munson, MD. Schedule an appointment as soon as possible for a visit in 2 week(s).   Specialty:  Infectious Diseases Contact information: [address] [place] [address] [phone]            Contact information for after-discharge care    Destination    [facility] .   Service:  Skilled Nursing Contact information: [address] [place] [address] [phone]                 Allergies  Allergen Reactions  . Penicillins Anaphylaxis    THROAT SWELLING  Has patient had a PCN reaction causing immediate rash, facial/tongue/throat swelling, SOB or lightheadedness with hypotension:  # # YES # #  Has patient had a PCN reaction causing severe rash involving mucus membranes or skin necrosis: # # # YES # # # Has patient had a PCN reaction that required hospitalization:No Has patient had a PCN reaction occurring within the last 10 years:  # # YES # #  If all of the above answers are "NO", then  may proceed with Cephalosporin use.   Jake Seats [Denosumab] Other (See Comments)    Severe Pain in the groin area.  . Ceftriaxone     Possible cause of neutropenia  . Fosamax [Alendronate Sodium]     UNSPECIFIED REACTION     Consultations:   ID  Other Procedures/Studies: Dg Chest 2 View  Result Date: 06/06/2017 CLINICAL DATA:  77 y/o F; 1 week of back pain and confusion with fever. EXAM: CHEST - 2 VIEW  COMPARISON:  05/04/2017 chest radiograph FINDINGS: Stable normal cardiac silhouette given projection and technique. Aortic atherosclerosis with calcification. Bronchitic changes in the left lung base. No focal consolidation. No pleural effusion or pneumothorax. 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 Right upper quadrant cholecystectomy clips. IMPRESSION: 1. Bronchitic changes in the left lung base. No focal consolidation. 2. Aortic atherosclerosis. Electronically Signed   By: Mitzi Hansen M.D.   On: 06/06/2017 23:36   Ct Head Wo Contrast  Result Date: 06/07/2017 CLINICAL DATA:  77 y/o F; fever and back pain. Altered mental status. EXAM: CT HEAD WITHOUT CONTRAST TECHNIQUE: Contiguous axial images were obtained from the base of the skull through the vertex without intravenous contrast. COMPARISON:  09/14/2014 CT head FINDINGS: Brain: No evidence of acute infarction, hemorrhage, hydrocephalus, extra-axial collection or mass lesion/mass effect. Stable chronic microvascular ischemic changes and parenchymal volume loss of the brain. Vascular: No hyperdense vessel or unexpected calcification. Skull: Normal. Negative for fracture or focal lesion. Sinuses/Orbits: No acute finding. Other: None. IMPRESSION: 1. No acute intracranial abnormality identified. 2. Stable chronic microvascular ischemic changes and parenchymal volume loss of the brain. Electronically Signed   By: Mitzi Hansen M.D.   On:  06/07/2017 05:20   US Guided Needle Placement  Result Date: 06/14/2017 CLINICAL DATA:  Recurrent subcutaneous complex collection adjacent to the right ischium. EXAM: IR ULTRASOUND GUIDED ASPIRATION/DRAINAGE COMPARISON:  CT 06/10/2017 TECHNIQUE: The procedure, risks (including but not limited to bleeding, infection, organ damage ), benefits, and alternatives were explained to the patient. Questions regarding the procedure were encouraged and answered. The patient understands and consents to the procedure. Survey ultrasound of the right gluteal region was performed and a complex subcutaneous fluid collection was identified. Overlying skin prepped with chlorhexidine, draped in usual sterile fashion, infiltrated locally with 1% lidocaine. An 18-gauge spinal needle was advanced into the collection under ultrasound guidance. Approximately 5 mL dark serosanguineous fluid were aspirated, sent for the requested laboratory studies. The patient tolerated the procedure well. COMPLICATIONS: None immediate IMPRESSION: 1. Technically successful aspiration of right gluteal subcutaneous collection Electronically Signed   By: Corlis Leak M.D.   On: 06/14/2017 08:28   Ct Abdomen Pelvis W Contrast  Result Date: 06/11/2017 CLINICAL DATA:  Abdominal pain, fever, abscess suspected. EXAM: CT ABDOMEN AND PELVIS WITH CONTRAST TECHNIQUE: Multidetector CT imaging of the abdomen and pelvis was performed using the standard protocol following bolus administration of intravenous contrast. CONTRAST:  OMNIPAQUE IOHEXOL 300 MG/ML  SOLN COMPARISON:  CT 04/29/2017 and pelvic MRI 05/02/2017 FINDINGS: Lower chest: Top-normal size heart with dependent bibasilar atelectasis. No pulmonary consolidation or effusion. Hepatobiliary: Cholecystectomy. No space-occupying mass of the liver. Mild fatty infiltration along the falciform ligament as before. Pancreas: No inflammation, mass or ductal dilatation. Spleen: Normal size spleen without mass.  Adrenals/Urinary Tract: Normal bilateral adrenal glands. Stable exophytic cyst arising from the posterior aspect of the right upper pole of the kidney measuring 8 mm compatible with a cyst. This is too small to further characterize. No hydroureteronephrosis. Normal bladder. Stomach/Bowel: Colonic diverticulosis of the sigmoid colon without acute diverticulitis. No bowel obstruction. The stomach and colon are nonacute. Vascular/Lymphatic: Aortoiliac atherosclerosis.  No adenopathy. Reproductive: Status post hysterectomy. No adnexal masses. Other: No supplemental non categorized findings. Musculoskeletal: Reaccumulation of fluid adjacent to the right ischial tuberosity now measuring 4.7 x 4.3 cm. On prior MRI, this collection had an involuted appearance with crenulated margins. This is no longer the case  where reaccumulation of fluid gives this finding a more distended ovoid appearance though overall the dimensions are slightly smaller than on prior exams. Stable right greater trochanteric bursal fluid measuring 3.4 x 1.6 cm. Stable shaved or truncated appearance of the right posterior ischial tuberosity. Lower thoracic and lumbar spondylosis with multilevel degenerative disc disease. Posterolateral rod and pedicle screw fixation from L3 through L5. Chronic grade 1 anterolisthesis of L4 on L5 with interbody fusion at L3-4. Degenerative disc disease L2-3 and T12-L1. Stable endplate irregularities of the lower thoracic spine and at L1-2. Periumbilical fat containing ventral hernias. IMPRESSION: 1. Reaccumulation of fluid adjacent to the right ischial tuberosity not measuring 4.7 x 4.3 cm. Potential evolving abscess is not excluded. 2. Stable right greater trochanteric bursal fluid measuring 3.4 x 1.6 cm. 3. Thoracolumbar spondylosis with lumbar fixation hardware as before. Electronically Signed   By: Tollie Eth M.D.   On: 06/11/2017 03:28   Dg Foot Complete Right  Result Date: 06/07/2017 CLINICAL DATA:  77 y/o F;  wound on the bottom of the heel. History of ankle fusion 2017. EXAM: RIGHT FOOT COMPLETE - 3+ VIEW COMPARISON:  07/12/2016 right ankle radiograph FINDINGS: There is a rod fixing the ankle joint with a transversely oriented interlocking screw through the calcaneus. Mild increase in periprosthetic lucency surrounding the rod and interlocking screw within the calcaneus and lower tibia. Severe degenerative changes of the tibiotalar joint with flattening of talar dome. Pes planus. Bones are diffusely demineralized and there is vascular calcification. Soft tissue swelling of the heel, no bony destructive change to suggest osteomyelitis. IMPRESSION: 1. No findings of osteomyelitis. 2. Mildly increased periprosthetic lucency along the rod traversing ankle fusion and interlocking screw within the calcaneus probably representing hardware loosening. Electronically Signed   By: Mitzi Hansen M.D.   On: 06/07/2017 03:45     TODAY-DAY OF DISCHARGE:  Subjective:   [name] today has no headache,no chest abdominal pain,no new weakness tingling or numbness, feels much better wants to go home today.   Objective:   Blood pressure 109/61, pulse 70, temperature 98.7 F (37.1 C), temperature source Oral, resp. rate 18, height 4\' 11"  (1.499 m), weight 65.6 kg (144 lb 10 oz), SpO2 96 %.  Intake/Output Summary (Last 24 hours) at 06/14/2017 1307 Last data filed at 06/14/2017 0518 Gross per 24 hour  Intake 1690 ml  Output 1200 ml  Net 490 ml   Filed Weights   06/12/17 0456 06/13/17 0609 06/14/17 0518  Weight: 66.7 kg (147 lb 0.8 oz) 67.2 kg (148 lb 2.4 oz) 65.6 kg (144 lb 10 oz)    Exam: Awake Alert, Oriented *3, No new F.N deficits, Normal affect [place].AT,PERRAL Supple Neck,No JVD, No cervical lymphadenopathy appriciated.  Symmetrical Chest wall movement, Good air movement bilaterally, CTAB RRR,No Gallops,Rubs or new Murmurs, No Parasternal Heave +ve B.Sounds, Abd Soft, Non tender, No organomegaly  appriciated, No rebound -guarding or rigidity. No Cyanosis, Clubbing or edema, No new Rash or bruise   PERTINENT RADIOLOGIC STUDIES: Dg Chest 2 View  Result Date: 06/06/2017 CLINICAL DATA:  77 y/o F; 1 week of back pain and confusion with fever. EXAM: CHEST - 2 VIEW COMPARISON:  05/04/2017 chest radiograph FINDINGS: Stable normal cardiac silhouette given projection and technique. Aortic atherosclerosis with calcification. Bronchitic changes in the left lung base. No focal consolidation. No pleural effusion or pneumothorax. 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 Right upper quadrant cholecystectomy clips. IMPRESSION: 1. Bronchitic  changes in the left lung base. No focal consolidation. 2. Aortic atherosclerosis. Electronically Signed   By: Mitzi Hansen M.D.   On: 06/06/2017 23:36   Ct Head Wo Contrast  Result Date: 06/07/2017 CLINICAL DATA:  77 y/o F; fever and back pain. Altered mental status. EXAM: CT HEAD WITHOUT CONTRAST TECHNIQUE: Contiguous axial images were obtained from the base of the skull through the vertex without intravenous contrast. COMPARISON:  09/14/2014 CT head FINDINGS: Brain: No evidence of acute infarction, hemorrhage, hydrocephalus, extra-axial collection or mass lesion/mass effect. Stable chronic microvascular ischemic changes and parenchymal volume loss of the brain. Vascular: No hyperdense vessel or unexpected calcification. Skull: Normal. Negative for fracture or focal lesion. Sinuses/Orbits: No acute finding. Other: None. IMPRESSION: 1. No acute intracranial abnormality identified. 2. Stable chronic microvascular ischemic changes and parenchymal volume loss of the brain. Electronically Signed   By: Mitzi Hansen M.D.   On: 06/07/2017 05:20   US Guided Needle Placement  Result Date: 06/14/2017 CLINICAL DATA:  Recurrent subcutaneous complex collection adjacent to the  right ischium. EXAM: IR ULTRASOUND GUIDED ASPIRATION/DRAINAGE COMPARISON:  CT 06/10/2017 TECHNIQUE: The procedure, risks (including but not limited to bleeding, infection, organ damage ), benefits, and alternatives were explained to the patient. Questions regarding the procedure were encouraged and answered. The patient understands and consents to the procedure. Survey ultrasound of the right gluteal region was performed and a complex subcutaneous fluid collection was identified. Overlying skin prepped with chlorhexidine, draped in usual sterile fashion, infiltrated locally with 1% lidocaine. An 18-gauge spinal needle was advanced into the collection under ultrasound guidance. Approximately 5 mL dark serosanguineous fluid were aspirated, sent for the requested laboratory studies. The patient tolerated the procedure well. COMPLICATIONS: None immediate IMPRESSION: 1. Technically successful aspiration of right gluteal subcutaneous collection Electronically Signed   By: Corlis Leak M.D.   On: 06/14/2017 08:28   Ct Abdomen Pelvis W Contrast  Result Date: 06/11/2017 CLINICAL DATA:  Abdominal pain, fever, abscess suspected. EXAM: CT ABDOMEN AND PELVIS WITH CONTRAST TECHNIQUE: Multidetector CT imaging of the abdomen and pelvis was performed using the standard protocol following bolus administration of intravenous contrast. CONTRAST:  OMNIPAQUE IOHEXOL 300 MG/ML  SOLN COMPARISON:  CT 04/29/2017 and pelvic MRI 05/02/2017 FINDINGS: Lower chest: Top-normal size heart with dependent bibasilar atelectasis. No pulmonary consolidation or effusion. Hepatobiliary: Cholecystectomy. No space-occupying mass of the liver. Mild fatty infiltration along the falciform ligament as before. Pancreas: No inflammation, mass or ductal dilatation. Spleen: Normal size spleen without mass. Adrenals/Urinary Tract: Normal bilateral adrenal glands. Stable exophytic cyst arising from the posterior aspect of the right upper pole of the kidney  measuring 8 mm compatible with a cyst. This is too small to further characterize. No hydroureteronephrosis. Normal bladder. Stomach/Bowel: Colonic diverticulosis of the sigmoid colon without acute diverticulitis. No bowel obstruction. The stomach and colon are nonacute. Vascular/Lymphatic: Aortoiliac atherosclerosis.  No adenopathy. Reproductive: Status post hysterectomy. No adnexal masses. Other: No supplemental non categorized findings. Musculoskeletal: Reaccumulation of fluid adjacent to the right ischial tuberosity now measuring 4.7 x 4.3 cm. On prior MRI, this collection had an involuted appearance with crenulated margins. This is no longer the case where reaccumulation of fluid gives this finding a more distended ovoid appearance though overall the dimensions are slightly smaller than on prior exams. Stable right greater trochanteric bursal fluid measuring 3.4 x 1.6 cm. Stable shaved or truncated appearance of the right posterior ischial tuberosity. Lower thoracic and lumbar spondylosis with multilevel degenerative disc disease. Posterolateral rod and  pedicle screw fixation from L3 through L5. Chronic grade 1 anterolisthesis of L4 on L5 with interbody fusion at L3-4. Degenerative disc disease L2-3 and T12-L1. Stable endplate irregularities of the lower thoracic spine and at L1-2. Periumbilical fat containing ventral hernias. IMPRESSION: 1. Reaccumulation of fluid adjacent to the right ischial tuberosity not measuring 4.7 x 4.3 cm. Potential evolving abscess is not excluded. 2. Stable right greater trochanteric bursal fluid measuring 3.4 x 1.6 cm. 3. Thoracolumbar spondylosis with lumbar fixation hardware as before. Electronically Signed   By: Tollie Eth M.D.   On: 06/11/2017 03:28   Dg Foot Complete Right  Result Date: 06/07/2017 CLINICAL DATA:  77 y/o F; wound on the bottom of the heel. History of ankle fusion 2017. EXAM: RIGHT FOOT COMPLETE - 3+ VIEW COMPARISON:  07/12/2016 right ankle radiograph  FINDINGS: There is a rod fixing the ankle joint with a transversely oriented interlocking screw through the calcaneus. Mild increase in periprosthetic lucency surrounding the rod and interlocking screw within the calcaneus and lower tibia. Severe degenerative changes of the tibiotalar joint with flattening of talar dome. Pes planus. Bones are diffusely demineralized and there is vascular calcification. Soft tissue swelling of the heel, no bony destructive change to suggest osteomyelitis. IMPRESSION: 1. No findings of osteomyelitis. 2. Mildly increased periprosthetic lucency along the rod traversing ankle fusion and interlocking screw within the calcaneus probably representing hardware loosening. Electronically Signed   By: Mitzi Hansen M.D.   On: 06/07/2017 03:45     PERTINENT LAB RESULTS: CBC: Recent Labs    06/12/17 0346 06/14/17 0523  WBC 7.0 16.3*  HGB 9.3* 11.1*  HCT 30.2* 36.5  PLT 546* 584*   CMET CMP     Component Value Date/Time   NA 142 06/11/2017 1036   K 3.2 (L) 06/11/2017 1036   CL 105 06/11/2017 1036   CO2 27 06/11/2017 1036   GLUCOSE 111 (H) 06/11/2017 1036   BUN 20 06/11/2017 1036   CREATININE 0.94 06/14/2017 0523   CREATININE 0.82 05/12/2017 1141   CALCIUM 8.7 (L) 06/11/2017 1036   PROT 5.3 (L) 06/07/2017 0604   ALBUMIN 2.3 (L) 06/07/2017 0604   AST 11 (L) 06/07/2017 0604   ALT 9 (L) 06/07/2017 0604   ALKPHOS 131 (H) 06/07/2017 0604   BILITOT 0.8 06/07/2017 0604   GFRNONAA 57 (L) 06/14/2017 0523   GFRAA >60 06/14/2017 0523    GFR Estimated Creatinine Clearance: 40.6 mL/min (by C-G formula based on SCr of 0.94 mg/dL). No results for input(s): LIPASE, AMYLASE in the last 72 hours. No results for input(s): CKTOTAL, CKMB, CKMBINDEX, TROPONINI in the last 72 hours. Invalid input(s): POCBNP No results for input(s): DDIMER in the last 72 hours. No results for input(s): HGBA1C in the last 72 hours. No results for input(s): CHOL, HDL, LDLCALC, TRIG,  CHOLHDL, LDLDIRECT in the last 72 hours. No results for input(s): TSH, T4TOTAL, T3FREE, THYROIDAB in the last 72 hours.  Invalid input(s): FREET3 No results for input(s): VITAMINB12, FOLATE, FERRITIN, TIBC, IRON, RETICCTPCT in the last 72 hours. Coags: No results for input(s): INR in the last 72 hours.  Invalid input(s): PT Microbiology: Recent Results (from the past 240 hour(s))  Culture, blood (Routine x 2)     Status: None   Collection Time: 06/07/17  1:05 AM  Result Value Ref Range Status   Specimen Description BLOOD LEFT FOREARM  Final   Special Requests   Final    BOTTLES DRAWN AEROBIC AND ANAEROBIC Blood Culture adequate volume  Culture   Final    NO GROWTH 5 DAYS Performed at [facility], [address] [place], [place] [address]    Report Status 06/12/2017 FINAL  Final  Culture, blood (Routine x 2)     Status: None   Collection Time: 06/07/17  1:10 AM  Result Value Ref Range Status   Specimen Description BLOOD RIGHT WRIST  Final   Special Requests   Final    BOTTLES DRAWN AEROBIC AND ANAEROBIC Blood Culture adequate volume   Culture   Final    NO GROWTH 5 DAYS Performed at [facility], [address] [place], [place] [address]    Report Status 06/12/2017 FINAL  Final  Urine culture     Status: Abnormal   Collection Time: 06/07/17  1:18 AM  Result Value Ref Range Status   Specimen Description URINE, CATHETERIZED  Final   Special Requests   Final    NONE Performed at [facility], [address], [place], [place] [address]    Culture MULTIPLE SPECIES PRESENT, SUGGEST RECOLLECTION (A)  Final   Report Status 06/08/2017 FINAL  Final  MRSA PCR Screening     Status: Abnormal   Collection Time: 06/07/17  9:56 AM  Result Value Ref Range Status   MRSA by PCR POSITIVE (A) NEGATIVE Final    Comment:        The GeneXpert MRSA Assay (FDA approved for NASAL specimens only), is one component of a comprehensive MRSA colonization surveillance program.  It is not intended to diagnose MRSA infection nor to guide or monitor treatment for MRSA infections. RESULT CALLED TO, READ BACK BY AND VERIFIED WITH: RN Zebedee Iba [phone] [id number] MLM Performed at [facility], [address] [place], [place] [address]   Aerobic/Anaerobic Culture (surgical/deep wound)     Status: None (Preliminary result)   Collection Time: 06/12/17  2:09 PM  Result Value Ref Range Status   Specimen Description ABSCESS ISCHIAL  Final   Special Requests Normal  Final   Gram Stain   Final    FEW WBC PRESENT, PREDOMINANTLY MONONUCLEAR NO ORGANISMS SEEN    Culture   Final    NO GROWTH 2 DAYS Performed at [facility], [address] [place], [place] [address]    Report Status PENDING  Incomplete    FURTHER DISCHARGE INSTRUCTIONS:  Get Medicines reviewed and adjusted: Please take all your medications with you for your next visit with your Primary MD  Laboratory/radiological data: Please request your Primary MD to go over all hospital tests and procedure/radiological results at the follow up, please ask your Primary MD to get all Hospital records sent to his/her office.  In some cases, they will be blood work, cultures and biopsy results pending at the time of your discharge. Please request that your primary care M.D. goes through all the records of your hospital data and follows up on these results.  Also Note the following: If you experience worsening of your admission symptoms, develop shortness of breath, life threatening emergency, suicidal or homicidal thoughts you must seek medical attention immediately by calling 911 or calling your MD immediately  if symptoms less severe.  You must read complete instructions/literature along with all the possible adverse reactions/side effects for all the Medicines you take and that have been prescribed to you. Take any new Medicines after you have completely understood and accpet all the possible adverse  reactions/side effects.   Do not drive  when taking Pain medications or sleeping medications (Benzodaizepines)  Do not take more than prescribed Pain, Sleep and Anxiety Medications. It is not advisable to combine anxiety,sleep and pain medications without talking with your primary care practitioner  Special Instructions: If you have smoked or chewed Tobacco  in the last 2 yrs please stop smoking, stop any regular Alcohol  and or any Recreational drug use.  Wear Seat belts while driving.  Please note: You were cared for by a hospitalist during your hospital stay. Once you are discharged, your primary care physician will handle any further medical issues. Please note that NO REFILLS for any discharge medications will be authorized once you are discharged, as it is imperative that you return to your primary care physician (or establish a relationship with a primary care physician if you do not have one) for your post hospital discharge needs so that they can reassess your need for medications and monitor your lab values.  Total Time spent coordinating discharge including counseling, education and face to face time equals 45 minutes.  SignedJeoffrey Massed 06/14/2017 1:07 PM

## 2017-06-14 NOTE — Clinical Social Work Note (Addendum)
Patient has insurance approval to discharge to SNF today if stable.  Charlynn Court, CSW [phone]  9:59 am SNF determining if they can take IV Daptomycin.  Charlynn Court, CSW [phone]  10:31 am SNF cannot take patient with IV Daptomycin due to high cost. CSW paged MD.  Charlynn Court, CSW [phone]  11:36 am Per MD, ID will make recommendations on other antibiotic options. SNF aware.  Charlynn Court, CSW [phone]

## 2017-06-17 LAB — AEROBIC/ANAEROBIC CULTURE W GRAM STAIN (SURGICAL/DEEP WOUND)
Culture: NO GROWTH
Special Requests: NORMAL

## 2017-06-17 LAB — AEROBIC/ANAEROBIC CULTURE (SURGICAL/DEEP WOUND)

## 2017-06-23 ENCOUNTER — Ambulatory Visit (INDEPENDENT_AMBULATORY_CARE_PROVIDER_SITE_OTHER): Payer: Medicare Other | Admitting: Internal Medicine

## 2017-06-23 ENCOUNTER — Encounter: Payer: Self-pay | Admitting: Internal Medicine

## 2017-06-23 VITALS — BP 130/71 | HR 110 | Temp 98.3°F

## 2017-06-23 DIAGNOSIS — R5081 Fever presenting with conditions classified elsewhere: Secondary | ICD-10-CM | POA: Diagnosis not present

## 2017-06-23 DIAGNOSIS — M869 Osteomyelitis, unspecified: Secondary | ICD-10-CM | POA: Diagnosis not present

## 2017-06-23 DIAGNOSIS — D709 Neutropenia, unspecified: Secondary | ICD-10-CM | POA: Diagnosis not present

## 2017-06-23 DIAGNOSIS — Z5181 Encounter for therapeutic drug level monitoring: Secondary | ICD-10-CM

## 2017-06-23 DIAGNOSIS — M4644 Discitis, unspecified, thoracic region: Secondary | ICD-10-CM

## 2017-06-23 LAB — CBC WITH DIFFERENTIAL/PLATELET
BASOS PCT: 0.7 %
Basophils Absolute: 106 cells/uL (ref 0–200)
EOS ABS: 167 {cells}/uL (ref 15–500)
Eosinophils Relative: 1.1 %
HEMATOCRIT: 30.8 % — AB (ref 35.0–45.0)
Hemoglobin: 10.1 g/dL — ABNORMAL LOW (ref 11.7–15.5)
LYMPHS ABS: 1611 {cells}/uL (ref 850–3900)
MCH: 26.8 pg — AB (ref 27.0–33.0)
MCHC: 32.8 g/dL (ref 32.0–36.0)
MCV: 81.7 fL (ref 80.0–100.0)
MPV: 9.8 fL (ref 7.5–12.5)
Monocytes Relative: 8.7 %
NEUTROS PCT: 78.9 %
Neutro Abs: 11993 cells/uL — ABNORMAL HIGH (ref 1500–7800)
Platelets: 359 10*3/uL (ref 140–400)
RBC: 3.77 10*6/uL — AB (ref 3.80–5.10)
RDW: 17.1 % — ABNORMAL HIGH (ref 11.0–15.0)
TOTAL LYMPHOCYTE: 10.6 %
WBC: 15.2 10*3/uL — AB (ref 3.8–10.8)
WBCMIX: 1322 {cells}/uL — AB (ref 200–950)

## 2017-06-23 LAB — COMPLETE METABOLIC PANEL WITH GFR
AG RATIO: 1.3 (calc) (ref 1.0–2.5)
ALT: 23 U/L (ref 6–29)
AST: 14 U/L (ref 10–35)
Albumin: 3.4 g/dL — ABNORMAL LOW (ref 3.6–5.1)
Alkaline phosphatase (APISO): 133 U/L — ABNORMAL HIGH (ref 33–130)
BILIRUBIN TOTAL: 0.3 mg/dL (ref 0.2–1.2)
BUN: 20 mg/dL (ref 7–25)
CALCIUM: 9.1 mg/dL (ref 8.6–10.4)
CHLORIDE: 99 mmol/L (ref 98–110)
CO2: 24 mmol/L (ref 20–32)
Creat: 0.84 mg/dL (ref 0.60–0.93)
GFR, EST AFRICAN AMERICAN: 77 mL/min/{1.73_m2} (ref >=60)
GFR, EST NON AFRICAN AMERICAN: 67 mL/min/{1.73_m2} (ref >=60)
GLOBULIN: 2.7 g/dL (ref 1.9–3.7)
Glucose, Bld: 90 mg/dL (ref 65–99)
POTASSIUM: 3.7 mmol/L (ref 3.5–5.3)
SODIUM: 138 mmol/L (ref 135–146)
TOTAL PROTEIN: 6.1 g/dL (ref 6.1–8.1)

## 2017-06-23 NOTE — Assessment & Plan Note (Signed)
GBS and treated now.

## 2017-06-23 NOTE — Progress Notes (Signed)
   Subjective:    Patient ID: Andrea Yu, female    DOB: 20-Nov-1940, 77 y.o.   MRN: [id number]  HPI Here for hospital follow up. 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  She then went to the hospital on 5/13 with fever and chills and admitted and changed to daptomycin and cipro.  She completed 8 weeks of Iv treatment and now is on just cipro after culture of fluid.  Culture remained negative.  Her pain is improved and only occasional muscle spasm in her back and is generally controlled with muscle relaxant.  No associated diarrhea, rash.     Review of Systems  Constitutional: Negative for fever.  Gastrointestinal: Negative for diarrhea and nausea.       Objective:   Physical Exam  Constitutional: She appears well-developed and well-nourished.  In wheelchair  Eyes: No scleral icterus.  Cardiovascular: Normal rate, regular rhythm and normal heart sounds.  No murmur heard. Pulmonary/Chest: Effort normal and breath sounds normal. No respiratory distress.  Skin: No rash noted.   SH: currently residing in rehab       Assessment & Plan:

## 2017-06-23 NOTE — Assessment & Plan Note (Signed)
Will check cmp today  

## 2017-06-23 NOTE — Assessment & Plan Note (Signed)
Completed treatment and doing well.  Recent inflammatory markers reassuring and much improved pain.  No further treatment indicated.  I discussed signs of recurrence, mainly pain increasing in the area, fever without other association.

## 2017-06-23 NOTE — Assessment & Plan Note (Signed)
Neutropenia resolved.  Will recheck today.

## 2017-07-15 ENCOUNTER — Other Ambulatory Visit (HOSPITAL_COMMUNITY): Payer: Self-pay | Admitting: Internal Medicine

## 2017-07-15 DIAGNOSIS — M7989 Other specified soft tissue disorders: Principal | ICD-10-CM

## 2017-07-15 DIAGNOSIS — M79604 Pain in right leg: Secondary | ICD-10-CM

## 2017-07-18 ENCOUNTER — Ambulatory Visit (HOSPITAL_COMMUNITY): Payer: Medicare Other

## 2017-07-30 ENCOUNTER — Other Ambulatory Visit: Payer: Self-pay | Admitting: Cardiovascular Disease

## 2017-09-02 ENCOUNTER — Other Ambulatory Visit: Payer: Self-pay | Admitting: Cardiovascular Disease

## 2017-09-02 NOTE — Telephone Encounter (Signed)
Rx request sent to pharmacy.  

## 2017-10-11 ENCOUNTER — Other Ambulatory Visit: Payer: Self-pay | Admitting: Cardiovascular Disease

## 2017-11-01 ENCOUNTER — Other Ambulatory Visit: Payer: Self-pay | Admitting: Surgery

## 2017-11-01 DIAGNOSIS — T792XXA Traumatic secondary and recurrent hemorrhage and seroma, initial encounter: Secondary | ICD-10-CM

## 2017-12-06 ENCOUNTER — Ambulatory Visit (INDEPENDENT_AMBULATORY_CARE_PROVIDER_SITE_OTHER): Payer: Medicare Other | Admitting: Internal Medicine

## 2017-12-06 ENCOUNTER — Telehealth: Payer: Self-pay

## 2017-12-06 DIAGNOSIS — M869 Osteomyelitis, unspecified: Secondary | ICD-10-CM

## 2017-12-06 DIAGNOSIS — M8610 Other acute osteomyelitis, unspecified site: Secondary | ICD-10-CM

## 2017-12-06 DIAGNOSIS — L0231 Cutaneous abscess of buttock: Secondary | ICD-10-CM | POA: Diagnosis not present

## 2017-12-06 MED ORDER — ERTAPENEM SODIUM 1 G IJ SOLR: 1.0000 g | INTRAMUSCULAR | Status: DC

## 2017-12-06 MED ORDER — VANCOMYCIN HCL IN DEXTROSE 1-5 GM/200ML-% IV SOLN
1000.0000 mg | INTRAVENOUS | Status: DC
Start: 2017-12-06 — End: 2018-05-25

## 2017-12-06 NOTE — Progress Notes (Signed)
[facility] for Infectious Disease  Reason for Consult: Left buttock abscess and pelvic osteomyelitis Referring Provider: Dr.Chelsea Conner  Assessment: She has chronic, smoldering left buttock infection and osteomyelitis.  I discussed the situation with Dr. Windle Guard who did not feel that she would tolerate incision and drainage well due to high risk for anesthesia and poor wound healing.  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  I reviewed options for antibiotic treatment with them.  I recommended broad empiric therapy with IV vancomycin and ertapenem.  They are in agreement with that plan.  I will have a PICC placed and she will receive her first dose of antibiotics in the short stay unit and then arrange home IV antibiotic therapy with the help of [facility].  She will follow-up here in 3 weeks.   Plan: 1. Check CBC, BMP ESR and CRP today 2. Arrange PICC insertion 3. Start IV vancomycin and ertapenem 4. Discontinue doxycycline 5. Weekly lab work 6. Follow-up here in 3 weeks  Patient Active Problem List   Diagnosis Date Noted  . Left buttock abscess 12/06/2017    Priority: High  . Osteomyelitis of pelvis ([place])     Priority: High  . Discitis of thoracic region 05/12/2017  . Medication monitoring encounter 05/12/2017  . Hx of adverse drug reaction   . History of blood transfusion   . GERD (gastroesophageal reflux disease)   . Dysrhythmia   . Complication of anesthesia   . Arthritis   . Anemia   . Displaced spiral fracture of shaft of right tibia, subsequent encounter for closed fracture with delayed healing 11/11/2016  . 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 SAH (subarachnoid hemorrhage) ([place]) 09/14/2014  . Hypertension 09/14/2014  . Obesity (BMI 30-39.9)- suspected sleep apnea, declines sleep study 11/10/2012  . Psoriatic arthritis ([place]) 11/06/2012  . Iron deficiency  anemia- transfused this admission 11/06/2012  . Depression 11/06/2012  . Peripheral neuropathy 11/06/2012  . Adrenal insufficiency ([place]) 08/09/2012  . PAF (paroxysmal atrial fibrillation) ([facility]) 08/09/2012    Patient's Medications  New Prescriptions   VANCOMYCIN (VANCOCIN) 1-5 GM/200ML-% SOLN    Inject 200 mLs (1,000 mg total) into the vein daily.  Previous Medications   ACETAMINOPHEN (TYLENOL) 325 MG TABLET    Take 2 tablets (650 mg total) by mouth every 4 (four) hours as needed.   ACETAMINOPHEN (TYLENOL) 325 MG TABLET    Take 2 tablets (650 mg total) by mouth every 6 (six) hours as needed for mild pain (or Fever >/= 101).   AMINO ACIDS-PROTEIN HYDROLYS (FEEDING SUPPLEMENT, PRO-STAT SUGAR FREE 64,) LIQD    Take 30 mLs by mouth 2 (two) times daily.   APREMILAST (OTEZLA) 30 MG TABS    Take 30 mg by mouth 2 (two) times daily.   ASPIRIN EC 81 MG TABLET    Take 81 mg by mouth every evening.   DILT-XR 180 MG 24 HR CAPSULE    Take 180 mg by mouth at bedtime.   DULOXETINE (CYMBALTA) 30 MG CAPSULE    Take 90 mg by mouth daily. Take along with 60 mg capsule=90 mg   DULOXETINE (CYMBALTA) 60 MG CAPSULE    Take 90 mg by mouth daily. Take along with 72m capsule to make a total of 926m   FORTEO 600 MCG/2.4ML SOLN    Inject 20 mcg into the skin daily.  FUROSEMIDE (LASIX) 40 MG TABLET    TAKE 2 TABLETS BY MOUTH  DAILY   FUROSEMIDE (LASIX) 80 MG TABLET    Take 80 mg by mouth.   GABAPENTIN (NEURONTIN) 600 MG TABLET    Take 600-1,200 mg by mouth 2 (two) times daily. 1 tablet in morning and 2 tablets at night   HYDROCODONE-ACETAMINOPHEN (NORCO/VICODIN) 5-325 MG TABLET    Take 1-2 tablets by mouth every 6 (six) hours as needed for moderate pain or severe pain.   HYDROXYPROPYL METHYLCELLULOSE / HYPROMELLOSE (ISOPTO TEARS / GONIOVISC) 2.5 % OPHTHALMIC SOLUTION    Place 1 drop into both eyes 3 (three) times daily as needed for dry eyes.   LOSARTAN (COZAAR) 100 MG TABLET    Take 100 mg by mouth daily.     MAGNESIUM 250 MG TABS    Take 1 tablet by mouth every evening.    METHOCARBAMOL (ROBAXIN) 500 MG TABLET    Take 1 tablet (500 mg total) by mouth every 8 (eight) hours as needed for muscle spasms.   METOPROLOL SUCCINATE (TOPROL-XL) 100 MG 24 HR TABLET    TAKE 1 TABLET BY MOUTH  DAILY.   METOPROLOL SUCCINATE (TOPROL-XL) 50 MG 24 HR TABLET    Take 1 tablet (50 mg total) by mouth daily. Take with or immediately following a meal.   MULTIPLE VITAMIN (MULTIVITAMIN WITH MINERALS) TABS    Take 1 tablet by mouth daily.   OMEPRAZOLE (PRILOSEC) 20 MG CAPSULE    Take 20 mg by mouth every evening.   POTASSIUM CHLORIDE SA (K-DUR,KLOR-CON) 20 MEQ TABLET    Take 20 mEq by mouth 2 (two) times daily.    PREDNISONE (DELTASONE) 5 MG TABLET    Take 1 tablet (5 mg total) by mouth daily with breakfast.   PROAIR HFA 108 (90 BASE) MCG/ACT INHALER    Inhale 1-2 puffs into the lungs every 6 (six) hours as needed for wheezing or shortness of breath.    VITAMIN C (ASCORBIC ACID) 500 MG TABLET    Take 500 mg by mouth 2 (two) times daily.   VITAMIN D, ERGOCALCIFEROL, (DRISDOL) 50000 UNITS CAPS    Take 50,000 Units by mouth every Tuesday.   Modified Medications   No medications on file  Discontinued Medications   No medications on file    HPI: Andrea Yu is a 77 y.o. female with multiple medical problems.  She was admitted to the hospital in March with group B streptococcal bacteremia and a right pelvic osteomyelitis and abscess.  No organisms were seen on Gram stain and cultures of the abscess are negative.  She has an allergy to penicillin and was discharged on oral levofloxacin.  She was readmitted in early April and found to have T10-11 discitis and osteomyelitis.  She was changed to IV ceftriaxone with the plan to receive 8 weeks of total therapy.  However she developed leukopenia and her ceftriaxone was changed to vancomycin in early May.  She was readmitted on 06/06/2017 with low-grade fever of 100 degrees and neutropenia.   She was changed to IV daptomycin and ciprofloxacin.  She was followed by my partners during this time and completed therapy on 06/23/2017.  In August she developed swelling of her left buttock.  Apparently there was concern that she may have developed a hematoma after a fall.  She was referred to Dr. Windle Guard.  The area was incised and drained in the office.  Dr. Windle Guard described the fluid as brown and murky suggestive of  infection.  Apparently she was already on oral trimethoprim sulfamethoxazole and that was continued.  No cultures were obtained.  She has had a draining sinus tract ever since that time.  The wound is packed by her daughter or aid in a daily basis.  It continues to drain large amounts of yellow to brown fluid.  She is not having any significant pain.  She has not had any fever, chills or sweats.  Her husband recently died after a prolonged illness.  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  Her previous right pelvic abscess and osteomyelitis had resolved.  She was started on oral doxycycline last week.    Review of Systems: Review of Systems  Constitutional: Negative for chills, diaphoresis and fever.  Gastrointestinal: Negative for abdominal pain, diarrhea, nausea and vomiting.    . ertapenem  1 g Intravenous Q24H    Past Medical History:  Diagnosis Date  . A-fib ([place])    "just after knee 2nd OR" (11/07/2012)  . Anemia   . Arthritis    RA , SACROTIC ARTHRITIS  . Complication of anesthesia    found to be in afib when she woke up  . Dysrhythmia   . GERD (gastroesophageal reflux disease)   . Head injury, closed, with concussion   . History of blood transfusion    "after 1 of my knee ORs and today" (11/07/2012)  . Hypertension   . Peripheral neuropathy    "from back OR in 2005; in  hands and feet" (11/07/2012)  . Psoriatic arthritis ([place])    on Prednisone Rx    Social History   Tobacco Use  . Smoking status: Former Smoker    Packs/day: 0.50    Years: 30.00    Pack years: 15.00    Types: Cigarettes    Last attempt to quit: 08/09/1981    Years since quitting: 36.3  . Smokeless tobacco: Never Used  Substance Use Topics  . Alcohol use: No  . Drug use: No    Family History  Problem Relation Age of Onset  . Heart attack Father        1951-deceased   . Liver disease Mother   . Aneurysm Son 15       Deceased    Allergies  Allergen Reactions  . Penicillins Anaphylaxis    THROAT SWELLING  Has patient had a PCN reaction causing immediate rash, facial/tongue/throat swelling, SOB or lightheadedness with hypotension:  # # YES # #  Has patient had a PCN reaction causing severe rash involving mucus membranes or skin necrosis: # # # YES # # # Has patient had a PCN reaction that required hospitalization:No Has patient had a PCN reaction occurring within the last 10 years:  # # YES # #  If all of the above answers are "NO", then may proceed with Cephalosporin use.   [place] [Denosumab] Other (See Comments)    Severe Pain in the groin area.  . Ceftriaxone     Possible cause of neutropenia  . Fosamax [Alendronate Sodium]     UNSPECIFIED REACTION   . Tubersol [Tuberculin Ppd] Other (See Comments)    Unknown     OBJECTIVE: Vitals:   12/06/17 1353  BP: 102/67  Pulse: 73  Temp: 97.7 F (36.5 C)  TempSrc: Oral  Weight: 143 lb (64.9 kg)   Body mass index is 28.88 kg/m.   Physical Exam  Constitutional: She is oriented to person, place, and time.  She is pleasant and in good spirits.  She is accompanied by her daughter and her caregiver.  Genitourinary:  Genitourinary Comments: She has a small draining sinus her left buttock.  There is no surrounding cellulitis or warmth.  About 18 inches of gauze packing was removed.  It was covered with bright yellow  purulent drainage.  I could easily express large amounts of thin brown fluid from the sinus tract.  Neurological: She is alert and oriented to person, place, and time.  Skin: No rash noted.  Psychiatric: She has a normal mood and affect.    Microbiology: No results found for this or any previous visit (from the past 240 hour(s)).  Michel Bickers, MD [facility] [phone] pager   [phone] cell 12/06/2017, 4:27 PM

## 2017-12-06 NOTE — Assessment & Plan Note (Signed)
She has chronic, smoldering left buttock infection and osteomyelitis.  I discussed the situation with Dr. Doylene Canard who did not feel that she would tolerate incision and drainage well due to high risk for anesthesia and poor wound healing.  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  I reviewed options for antibiotic treatment with them.  I recommended broad empiric therapy with IV vancomycin and ertapenem.  They are in agreement with that plan.  I will have a PICC placed and she will receive her first dose of antibiotics in the short stay unit and then arrange home IV antibiotic therapy with the help of [facility] home [facility].  She will follow-up here in 3 weeks.

## 2017-12-06 NOTE — Telephone Encounter (Addendum)
Per Dr. Orvan Falconer called Interventional Radiology for picc line placement. Left voicemail with Morrie Sheldon to schedule an appointment. Once appointment for picc placement in scheduled will fax orders to [facility] and short stay. Patient needs firs dose done at short Stay. Patient would like appointment to be after 10, but before 3. [facility]  [phone]  Fax: [phone]. Lorenso Courier, [place]

## 2017-12-07 ENCOUNTER — Telehealth: Payer: Self-pay

## 2017-12-07 LAB — BASIC METABOLIC PANEL
BUN/Creatinine Ratio: 27 (calc) — ABNORMAL HIGH (ref 6–22)
BUN: 26 mg/dL — ABNORMAL HIGH (ref 7–25)
CO2: 28 mmol/L (ref 20–32)
CREATININE: 0.95 mg/dL — AB (ref 0.60–0.93)
Calcium: 9.7 mg/dL (ref 8.6–10.4)
Chloride: 102 mmol/L (ref 98–110)
GLUCOSE: 98 mg/dL (ref 65–99)
Potassium: 4.7 mmol/L (ref 3.5–5.3)
Sodium: 142 mmol/L (ref 135–146)

## 2017-12-07 LAB — CBC
HEMATOCRIT: 31.1 % — AB (ref 35.0–45.0)
Hemoglobin: 9.8 g/dL — ABNORMAL LOW (ref 11.7–15.5)
MCH: 24.8 pg — ABNORMAL LOW (ref 27.0–33.0)
MCHC: 31.5 g/dL — ABNORMAL LOW (ref 32.0–36.0)
MCV: 78.7 fL — AB (ref 80.0–100.0)
MPV: 9.8 fL (ref 7.5–12.5)
Platelets: 385 10*3/uL (ref 140–400)
RBC: 3.95 10*6/uL (ref 3.80–5.10)
RDW: 15.8 % — ABNORMAL HIGH (ref 11.0–15.0)
WBC: 13.3 10*3/uL — ABNORMAL HIGH (ref 3.8–10.8)

## 2017-12-07 LAB — C-REACTIVE PROTEIN: CRP: 28.2 mg/L — AB (ref <8.0)

## 2017-12-07 LAB — SEDIMENTATION RATE: Sed Rate: 43 mm/h — ABNORMAL HIGH (ref 0–30)

## 2017-12-07 NOTE — Telephone Encounter (Signed)
Patient stopped by office this morning to drop off disk with MRI done at [facility]. Will leave Disk in Dr. Blair Dolphin box in his pod to review for patient. Lorenso Courier, [place]

## 2017-12-08 ENCOUNTER — Other Ambulatory Visit: Payer: Self-pay | Admitting: Internal Medicine

## 2017-12-08 ENCOUNTER — Encounter (HOSPITAL_COMMUNITY)
Admission: RE | Admit: 2017-12-08 | Discharge: 2017-12-08 | Disposition: A | Discharge status: Home / Self Care | Payer: Medicare Other | Source: Ambulatory Visit | Attending: Internal Medicine | Admitting: Internal Medicine

## 2017-12-08 ENCOUNTER — Ambulatory Visit (HOSPITAL_COMMUNITY)
Admission: RE | Admit: 2017-12-08 | Discharge: 2017-12-08 | Disposition: A | Discharge status: Home / Self Care | Payer: Medicare Other | Source: Ambulatory Visit | Attending: Internal Medicine | Admitting: Internal Medicine

## 2017-12-08 DIAGNOSIS — Z7982 Long term (current) use of aspirin: Secondary | ICD-10-CM | POA: Insufficient documentation

## 2017-12-08 DIAGNOSIS — L4050 Arthropathic psoriasis, unspecified: Secondary | ICD-10-CM | POA: Insufficient documentation

## 2017-12-08 DIAGNOSIS — K219 Gastro-esophageal reflux disease without esophagitis: Secondary | ICD-10-CM | POA: Insufficient documentation

## 2017-12-08 DIAGNOSIS — Z79899 Other long term (current) drug therapy: Secondary | ICD-10-CM | POA: Diagnosis not present

## 2017-12-08 DIAGNOSIS — I480 Paroxysmal atrial fibrillation: Secondary | ICD-10-CM | POA: Diagnosis not present

## 2017-12-08 DIAGNOSIS — Z8249 Family history of ischemic heart disease and other diseases of the circulatory system: Secondary | ICD-10-CM | POA: Insufficient documentation

## 2017-12-08 DIAGNOSIS — Z888 Allergy status to other drugs, medicaments and biological substances status: Secondary | ICD-10-CM | POA: Insufficient documentation

## 2017-12-08 DIAGNOSIS — Z880 Allergy status to penicillin: Secondary | ICD-10-CM | POA: Insufficient documentation

## 2017-12-08 DIAGNOSIS — F329 Major depressive disorder, single episode, unspecified: Secondary | ICD-10-CM | POA: Insufficient documentation

## 2017-12-08 DIAGNOSIS — I10 Essential (primary) hypertension: Secondary | ICD-10-CM | POA: Insufficient documentation

## 2017-12-08 DIAGNOSIS — M8610 Other acute osteomyelitis, unspecified site: Secondary | ICD-10-CM | POA: Diagnosis not present

## 2017-12-08 DIAGNOSIS — G629 Polyneuropathy, unspecified: Secondary | ICD-10-CM | POA: Insufficient documentation

## 2017-12-08 DIAGNOSIS — Z87891 Personal history of nicotine dependence: Secondary | ICD-10-CM | POA: Diagnosis not present

## 2017-12-08 DIAGNOSIS — E669 Obesity, unspecified: Secondary | ICD-10-CM | POA: Insufficient documentation

## 2017-12-08 DIAGNOSIS — Z8679 Personal history of other diseases of the circulatory system: Secondary | ICD-10-CM | POA: Insufficient documentation

## 2017-12-08 DIAGNOSIS — M869 Osteomyelitis, unspecified: Secondary | ICD-10-CM | POA: Insufficient documentation

## 2017-12-08 DIAGNOSIS — Z6828 Body mass index (BMI) 28.0-28.9, adult: Secondary | ICD-10-CM | POA: Insufficient documentation

## 2017-12-08 DIAGNOSIS — L0231 Cutaneous abscess of buttock: Secondary | ICD-10-CM | POA: Insufficient documentation

## 2017-12-08 DIAGNOSIS — R197 Diarrhea, unspecified: Secondary | ICD-10-CM

## 2017-12-08 MED ORDER — SODIUM CHLORIDE 0.9 % IV SOLN
1.0000 g | Freq: Once | INTRAVENOUS | Status: AC
  Administered 2017-12-08: 1000 mg via INTRAVENOUS
  Filled 2017-12-08 (×2): qty 1

## 2017-12-08 MED ORDER — LIDOCAINE HCL (PF) 1 % IJ SOLN
INTRAMUSCULAR | Status: DC | PRN
  Administered 2017-12-08: 5 mL

## 2017-12-08 MED ORDER — LIDOCAINE HCL 1 % IJ SOLN
INTRAMUSCULAR | Status: AC
  Filled 2017-12-08: qty 20

## 2017-12-08 MED ORDER — VANCOMYCIN HCL IN DEXTROSE 1-5 GM/200ML-% IV SOLN
1000.0000 mg | Freq: Once | INTRAVENOUS | Status: AC
  Administered 2017-12-08: 1000 mg via INTRAVENOUS
  Filled 2017-12-08: qty 200

## 2017-12-08 MED ORDER — ERTAPENEM SODIUM 1 G IJ SOLR: 1.0000 g | Freq: Once | INTRAMUSCULAR | Status: DC

## 2017-12-08 MED ORDER — HEPARIN SOD (PORK) LOCK FLUSH 100 UNIT/ML IV SOLN
250.0000 [IU] | Freq: Once | INTRAVENOUS | Status: AC
  Administered 2017-12-08: 250 [IU] via INTRAVENOUS

## 2017-12-08 NOTE — Discharge Instructions (Signed)
Vancomycin injection What is this medicine? VANCOMYCIN Lucianne Lei koe MYE sin) is a glycopeptide antibiotic. It is used to treat certain kinds of bacterial infections. It will not work for colds, flu, or other viral infections. This medicine may be used for other purposes; ask your health care provider or pharmacist if you have questions. COMMON BRAND NAME(S): Vancocin What should I tell my health care provider before I take this medicine? They need to know if you have any of these conditions: -dehydration -hearing loss -kidney disease -other chronic illness -an unusual or allergic reaction to vancomycin, other medicines, foods, dyes, or preservatives -pregnant or trying to get pregnant -breast-feeding How should I use this medicine? This medicine is infused into a vein. It is usually given by a health care provider in a hospital or clinic. If you receive this medicine at home, you will receive special instructions. Take your medicine at regular intervals. Do not take your medicine more often than directed. Take all of your medicine as directed even if you think you are better. Do not skip doses or stop your medicine early. It is important that you put your used needles and syringes in a special sharps container. Do not put them in a trash can. If you do not have a sharps container, call your pharmacist or healthcare provider to get one. Talk to your pediatrician regarding the use of this medicine in children. While this drug may be prescribed for even very young infants for selected conditions, precautions do apply. Overdosage: If you think you have taken too much of this medicine contact a poison control center or emergency room at once. NOTE: This medicine is only for you. Do not share this medicine with others. What if I miss a dose? If you miss a dose, take it as soon as you can. If it is almost time for your next dose, take only that dose. Do not take double or extra doses. What may interact  with this medicine? -amphotericin B -anesthetics -bacitracin -birth control pills -cisplatin -colistin -diuretics -other aminoglycoside antibiotics -polymyxin B This list may not describe all possible interactions. Give your health care provider a list of all the medicines, herbs, non-prescription drugs, or dietary supplements you use. Also tell them if you smoke, drink alcohol, or use illegal drugs. Some items may interact with your medicine. What should I watch for while using this medicine? Tell your doctor or health care professional if your symptoms do not improve or if you get new symptoms. Your condition and lab work will be monitored while you are taking this medicine. Do not treat diarrhea with over the counter products. Contact your doctor if you have diarrhea that lasts more than 2 days or if it is severe and watery. What side effects may I notice from receiving this medicine? Side effects that you should report to your doctor or health care professional as soon as possible: -allergic reactions like skin rash, itching or hives, swelling of the face, lips, or tongue -breathing difficulty, wheezing -change in amount, color of urine -change in hearing -chest pain -dizziness -fever, chills -flushing of the face and neck (reddening) -low blood pressure -redness, blistering, peeling or loosening of the skin, including inside the mouth -unusual bleeding or bruising -unusually weak or tired Side effects that usually do not require medical attention (report to your doctor or health care professional if they continue or are bothersome): -nausea, vomiting -pain, swelling where injected -stomach cramps This list may not describe all possible side effects.  Call your doctor for medical advice about side effects. You may report side effects to FDA at 1-800-FDA-1088. Where should I keep my medicine? Keep out of the reach of children. You will be instructed on how to store this medicine,  if needed. Throw away any unused medicine after the expiration date on the label. NOTE: This sheet is a summary. It may not cover all possible information. If you have questions about this medicine, talk to your doctor, pharmacist, or health care provider.  2018 Elsevier/Gold Standard (2012-08-18 14:46:02)   Ertapenem injection What is this medicine? ERTAPENEM (er ta PEN em) is a carbapenem antibiotic. It is used to treat certain kinds of bacterial infections. It will not work for colds, flu, or other viral infections. This medicine may be used for other purposes; ask your health care provider or pharmacist if you have questions. COMMON BRAND NAME(S): Invanz What should I tell my health care provider before I take this medicine? They need to know if you have any of these conditions: -brain tumor, lesion -kidney disease -seizure disorder -an unusual or allergic reaction to ertapenem, other antibiotics, amide local anesthetics like lidocaine, or other medicines, foods, dyes, or preservatives -pregnant or trying to get pregnant -breast-feeding How should I use this medicine? This medicine is infused into a vein or injected deep into a muscle. It is usually given by a health care professional in a hospital or clinic setting. If you get this medicine at home, you will be taught how to prepare and give this medicine. Use exactly as directed. Take your medicine at regular intervals. Do not take your medicine more often than directed. It is important that you put your used needles and syringes in a special sharps container. Do not put them in a trash can. If you do not have a sharps container, call your pharmacist or healthcare provider to get one. Talk to your pediatrician regarding the use of this medicine in children. While this drug may be prescribed for children as young as 40 months old for selected conditions, precautions do apply. Overdosage: If you think you have taken too much of this  medicine contact a poison control center or emergency room at once. NOTE: This medicine is only for you. Do not share this medicine with others. What if I miss a dose? If you miss a dose, take it as soon as you can. If it is almost time for your next dose, take only that dose. Do not take double or extra doses. What may interact with this medicine? -birth control pills -probenecid This list may not describe all possible interactions. Give your health care provider a list of all the medicines, herbs, non-prescription drugs, or dietary supplements you use. Also tell them if you smoke, drink alcohol, or use illegal drugs. Some items may interact with your medicine. What should I watch for while using this medicine? Tell your doctor or health care professional if your symptoms do not improve or if you get new symptoms. Your doctor will monitor your condition and blood work as needed. Do not treat diarrhea with over the counter products. Contact your doctor if you have diarrhea that lasts more than 2 days or if it is severe and watery. What side effects may I notice from receiving this medicine? Side effects that you should report to your doctor or health care professional as soon as possible: -allergic reactions like skin rash, itching or hives, swelling of the face, lips, or tongue -anxiety, confusion, dizzy -chest  pain -difficulty breathing, wheezing -edema, swelling -fever -irregular heart rate, blood pressure -pain or difficulty passing urine -seizures -unusually weak or tired -white or red patches in mouth Side effects that usually do not require medical attention (report to your doctor or health care professional if they continue or are bothersome): -constipation or diarrhea -difficulty sleeping -headache -nausea, vomiting -pain, swelling or irritation where injected -stomach upset -vaginal itch, irritation This list may not describe all possible side effects. Call your doctor for  medical advice about side effects. You may report side effects to FDA at 1-800-FDA-1088. Where should I keep my medicine? Keep out of the reach of children. You will be instructed on how to store this medicine. Throw away any unused medicine after the expiration date on the label. NOTE: This sheet is a summary. It may not cover all possible information. If you have questions about this medicine, talk to your doctor, pharmacist, or health care provider.  2018 Elsevier/Gold Standard (2012-08-18 07:36:44)

## 2017-12-08 NOTE — Telephone Encounter (Signed)
Patient had PICC line appointment scheduled today with IR. Orders faxed to short stay and [facility]. Informed [facility] that patient would prefer to work with [facility] since they are familiar with their services. Lorenso Courier, [place]

## 2017-12-08 NOTE — Progress Notes (Signed)
Pharmacy Antibiotic Note  Andrea Yu is a 77 y.o. 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  Pharmacy has been consulted for Vancomycin dosing.  Plan: Vancomycin 1g IV q24h (already ordered by MD)  Patient also to receive ertapenem  Height: 4\' 11"  (149.9 cm) Weight: 148 lb (67.1 kg) IBW/kg (Calculated) : 43.2  Temp (24hrs), Avg:98 F (36.7 C), Min:98 F (36.7 C), Max:98 F (36.7 C)  Recent Labs  Lab 12/06/17 1605  WBC 13.3*  CREATININE 0.95*    Estimated Creatinine Clearance: 40.7 mL/min (A) (by C-G formula based on SCr of 0.95 mg/dL (H)).    Allergies  Allergen Reactions  . Penicillins Anaphylaxis    THROAT SWELLING  Has patient had a PCN reaction causing immediate rash, facial/tongue/throat swelling, SOB or lightheadedness with hypotension:  # # YES # #  Has patient had a PCN reaction causing severe rash involving mucus membranes or skin necrosis: # # # YES # # # Has patient had a PCN reaction that required hospitalization:No Has patient had a PCN reaction occurring within the last 10 years:  # # YES # #  If all of the above answers are "NO", then may proceed with Cephalosporin use.   13/12/19 [Denosumab] Other (See Comments)    Severe Pain in the groin area.  . Ceftriaxone     Possible cause of neutropenia  . Fosamax [Alendronate Sodium]     UNSPECIFIED REACTION   . Tubersol [Tuberculin Ppd] Other (See Comments)    Unknown      Thank you for allowing pharmacy to be a part of this patient's care.  Jake Seats 12/08/2017 2:18 PM

## 2017-12-08 NOTE — Procedures (Signed)
Infection  S/p PICC  No comp Stable EBL 0 Tip svcra Ready for use Full report in pacs

## 2017-12-14 ENCOUNTER — Telehealth: Payer: Self-pay | Admitting: Behavioral Health

## 2017-12-14 ENCOUNTER — Other Ambulatory Visit: Payer: Self-pay | Admitting: Internal Medicine

## 2017-12-14 ENCOUNTER — Other Ambulatory Visit: Payer: Self-pay | Admitting: Behavioral Health

## 2017-12-14 DIAGNOSIS — R197 Diarrhea, unspecified: Secondary | ICD-10-CM

## 2017-12-14 DIAGNOSIS — N760 Acute vaginitis: Secondary | ICD-10-CM

## 2017-12-14 MED ORDER — FLUCONAZOLE 100 MG PO TABS: 100.0000 mg | ORAL_TABLET | Freq: Every day | ORAL | 3 refills | Status: DC

## 2017-12-14 NOTE — Telephone Encounter (Signed)
Called Andrea Yu, informed her per Dr. Megan Salon stool kit will be available to pick up. She states will come tomorrow to pick up kit.  Also informed her Fluconazole was sent to the pharmacy, [facility] for her to pick up as well to help treat yeast.  Patient verbalized understanding. Pricilla Riffle RN

## 2017-12-14 NOTE — Telephone Encounter (Signed)
I have placed orders for fluconazole and C. difficile testing.  Please have her come in to get a stool specimen cup.

## 2017-12-14 NOTE — Telephone Encounter (Signed)
Patient called today stating she has developed watery loose stools yesterday 12/13/2017.  Patient states she had 4 episodes yesterday and today she has had 3 so far.  Patient states she is on IV Vancomycin and Ertapenem.  Patient denies abdominal cramping or discomfort. She states she is also  developing yeast in her vaginal area and is requesting fluconazole if possible.  Explained to her Dr. Orvan Falconer would be notified and will get back to her. Angeline Slim RN

## 2017-12-19 ENCOUNTER — Encounter: Payer: Self-pay | Admitting: Internal Medicine

## 2017-12-19 ENCOUNTER — Other Ambulatory Visit: Payer: Medicare Other

## 2017-12-19 DIAGNOSIS — R197 Diarrhea, unspecified: Secondary | ICD-10-CM

## 2017-12-24 LAB — CLOSTRIDIUM DIFFICILE CULTURE-FECAL

## 2018-01-19 ENCOUNTER — Telehealth: Payer: Self-pay | Admitting: *Deleted

## 2018-01-19 NOTE — Telephone Encounter (Signed)
Patient started 6 week course of IV antibiotics 12/08/17, was supposed to follow up with Dr Orvan Falconer week of 12/3.  She did not make an appointment for follow up, does not have one scheduled. Her 6 week course of IV antibiotics ended 12/26.  Larita Fife from [facility] pharmacy is calling for pull PICC orders.   Please advise.  Andree Coss, RN

## 2018-01-19 NOTE — Telephone Encounter (Signed)
Relayed order to [facility] to pull PICC.

## 2018-01-19 NOTE — Telephone Encounter (Signed)
Relayed orders to [facility] at [facility] pharmacy, notified patient. Scheduled her to follow up with Judeth Cornfield, as she had an appointment available during patient's limited transportation window.

## 2018-01-19 NOTE — Telephone Encounter (Signed)
Pull the picc line and see if she can come in soon for follow up with someone. thanks

## 2018-01-20 NOTE — Telephone Encounter (Signed)
Fine to schedule with me - will be on the look out for her.

## 2018-01-20 NOTE — Telephone Encounter (Signed)
Thank you :)

## 2018-01-23 NOTE — Telephone Encounter (Signed)
Much thanks.

## 2018-01-24 ENCOUNTER — Telehealth: Payer: Self-pay

## 2018-01-24 ENCOUNTER — Ambulatory Visit (INDEPENDENT_AMBULATORY_CARE_PROVIDER_SITE_OTHER): Payer: Medicare Other | Admitting: Infectious Diseases

## 2018-01-24 ENCOUNTER — Encounter: Payer: Self-pay | Admitting: Infectious Diseases

## 2018-01-24 DIAGNOSIS — Z5181 Encounter for therapeutic drug level monitoring: Secondary | ICD-10-CM

## 2018-01-24 DIAGNOSIS — R197 Diarrhea, unspecified: Secondary | ICD-10-CM

## 2018-01-24 DIAGNOSIS — M869 Osteomyelitis, unspecified: Secondary | ICD-10-CM | POA: Diagnosis not present

## 2018-01-24 DIAGNOSIS — L0231 Cutaneous abscess of buttock: Secondary | ICD-10-CM

## 2018-01-24 DIAGNOSIS — Z95828 Presence of other vascular implants and grafts: Secondary | ICD-10-CM | POA: Insufficient documentation

## 2018-01-24 NOTE — Telephone Encounter (Signed)
Per Judeth Cornfield called [facility] to have antibiotics extended for another 3 weeks from 01/24/18. Spoke to [place] and confirmed continuation of antibiotics, and is aware that patient has one dose currently for today's administration.   Gerarda Fraction, [place]

## 2018-01-24 NOTE — Progress Notes (Signed)
Patient: Andrea Yu  DOB: 1940/08/06 MRN: [id number] PCP: Leanna Battles, MD  Referring Provider: Dr. Hector Brunswick   Patient Active Problem List   Diagnosis Date Noted  . S/P PICC central line placement 01/24/2018  . Diarrhea 12/14/2017  . Left buttock abscess 12/06/2017  . Discitis of thoracic region 05/12/2017  . Medication monitoring encounter 05/12/2017  . Hx of adverse drug reaction   . History of blood transfusion   . GERD (gastroesophageal reflux disease)   . Dysrhythmia   . Complication of anesthesia   . Arthritis   . Anemia   . Displaced spiral fracture of shaft of right tibia, subsequent encounter for closed fracture with delayed healing 11/11/2016  . 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 SAH (subarachnoid hemorrhage) ([place]) 09/14/2014  . Hypertension 09/14/2014  . Obesity (BMI 30-39.9)- suspected sleep apnea, declines sleep study 11/10/2012  . Psoriatic arthritis ([place]) 11/06/2012  . Iron deficiency anemia- transfused this admission 11/06/2012  . Depression 11/06/2012  . Peripheral neuropathy 11/06/2012  . Adrenal insufficiency ([place]) 08/09/2012  . PAF (paroxysmal atrial fibrillation) ([place]) 08/09/2012     Subjective:  CC:  Follow up chronic left buttock infection and osteomyelitis.   HPI:  Andrea Yu is a 77 y.o. female with multiple medical problems. She was admitted to the hospital in [place] with group b streptococcal bacteremia and right pelvic osteomyelitis and abscess. Cultures of the abscess were negative at the time. Readmitted in [place] with T10-11 discitis/osteomyelitis and treated with IV ceftriaxone for intended 8 weeks of therapy however she developed leukopenia and switched to vancomycin; readmitted short time later in May-2019 with low-grade fevers and neutropenia >> antibiotics were then switched to IV daptomycin and ciprofloxacin. August developed swelling of her left buttock and referred to Dr.  Windle Guard which treated with I&D'd as well as   Last seen by Dr. Megan Salon 12/06/17 where she was started on vanocmycin + ertepenem therapy via PICC line on 12/08/17. She is now on day 47 of antibiotic therapy and has not seen Dr. Megan Salon since initiation and her antibiotics have been extended through visit today for re-evaluation. Andrea Yu is joined by her two daughters who provide her care. They report drainage to be more red/brown with flecks of black. In the few days that her antibiotics were stopped they noted return of thick yellow drainage. She has left ischial pain. No fevers, chills or changes in mentation. Has had ongoing trouble with diarrhea - estimates 2 loose stools a day of moderate volume. No associated cramping, bloating, fevers or pain. No blood per rectum or melena.   PICC line is without pain, drainage or erythema and is well maintained by [facility] Team. No swelling or altered sensation in affected distal extremity. Does not consistently return blood but flushes easily.   Review of Systems  Constitutional: Negative for chills and fever.  Respiratory: Negative for cough and shortness of breath.   Cardiovascular: Negative for chest pain.  Gastrointestinal: Positive for diarrhea. Negative for abdominal pain, blood in stool and melena.  Genitourinary: Negative for dysuria.  Musculoskeletal: Back pain: left buttock/hip.  Skin: Negative for rash.    Past Medical History:  Diagnosis Date  . A-fib ([place])    "just after knee 2nd OR" (11/07/2012)  . Anemia   . Arthritis    RA , SACROTIC ARTHRITIS  . Complication of anesthesia    found to be in afib when she woke up  .  Dysrhythmia   . GERD (gastroesophageal reflux disease)   . Head injury, closed, with concussion   . History of blood transfusion    "after 1 of my knee ORs and today" (11/07/2012)  . Hypertension   . Peripheral neuropathy    "from back OR in 2005; in hands and feet" (11/07/2012)  . Psoriatic arthritis ([place])    on  Prednisone Rx    Outpatient Medications Prior to Visit  Medication Sig Dispense Refill  . acetaminophen (TYLENOL) 325 MG tablet Take 2 tablets (650 mg total) by mouth every 4 (four) hours as needed. (Patient taking differently: Take 325 mg by mouth every 6 (six) hours as needed (for pain.). )    . acetaminophen (TYLENOL) 325 MG tablet Take 2 tablets (650 mg total) by mouth every 6 (six) hours as needed for mild pain (or Fever >/= 101). 30 tablet 1  . Amino Acids-Protein Hydrolys (FEEDING SUPPLEMENT, PRO-STAT SUGAR FREE 64,) LIQD Take 30 mLs by mouth 2 (two) times daily. 900 mL 0  . Apremilast (OTEZLA) 30 MG TABS Take 30 mg by mouth 2 (two) times daily.    Marland Kitchen aspirin EC 81 MG tablet Take 81 mg by mouth every evening.    Marland Kitchen DILT-XR 180 MG 24 hr capsule Take 180 mg by mouth at bedtime.  1  . DULoxetine (CYMBALTA) 30 MG capsule Take 90 mg by mouth daily. Take along with 60 mg capsule=90 mg    . DULoxetine (CYMBALTA) 60 MG capsule Take 90 mg by mouth daily. Take along with 4m capsule to make a total of 921m    . fluconazole (DIFLUCAN) 100 MG tablet Take 1 tablet (100 mg total) by mouth daily. 7 tablet 3  . FORTEO 600 MCG/2.4ML SOLN Inject 20 mcg into the skin daily.     . furosemide (LASIX) 40 MG tablet TAKE 2 TABLETS BY MOUTH  DAILY 60 tablet 0  . furosemide (LASIX) 80 MG tablet Take 80 mg by mouth.    . gabapentin (NEURONTIN) 600 MG tablet Take 600-1,200 mg by mouth 2 (two) times daily. 1 tablet in morning and 2 tablets at night    . HYDROcodone-acetaminophen (NORCO/VICODIN) 5-325 MG tablet Take 1-2 tablets by mouth every 6 (six) hours as needed for moderate pain or severe pain. 15 tablet 0  . hydroxypropyl methylcellulose / hypromellose (ISOPTO TEARS / GONIOVISC) 2.5 % ophthalmic solution Place 1 drop into both eyes 3 (three) times daily as needed for dry eyes.    . Marland Kitchenosartan (COZAAR) 100 MG tablet Take 100 mg by mouth daily.     . Magnesium 250 MG TABS Take 1 tablet by mouth every evening.     .  methocarbamol (ROBAXIN) 500 MG tablet Take 1 tablet (500 mg total) by mouth every 8 (eight) hours as needed for muscle spasms. 30 tablet 0  . metoprolol succinate (TOPROL-XL) 100 MG 24 hr tablet TAKE 1 TABLET BY MOUTH  DAILY. 30 tablet 0  . metoprolol succinate (TOPROL-XL) 50 MG 24 hr tablet Take 1 tablet (50 mg total) by mouth daily. Take with or immediately following a meal. 30 tablet 0  . Multiple Vitamin (MULTIVITAMIN WITH MINERALS) TABS Take 1 tablet by mouth daily.    . Marland Kitchenmeprazole (PRILOSEC) 20 MG capsule Take 20 mg by mouth every evening.    . potassium chloride SA (K-DUR,KLOR-CON) 20 MEQ tablet Take 20 mEq by mouth 2 (two) times daily.     . predniSONE (DELTASONE) 5 MG tablet Take 1 tablet (5  mg total) by mouth daily with breakfast.    . PROAIR HFA 108 (90 BASE) MCG/ACT inhaler Inhale 1-2 puffs into the lungs every 6 (six) hours as needed for wheezing or shortness of breath.     . vancomycin (VANCOCIN) 1-5 GM/200ML-% SOLN Inject 200 mLs (1,000 mg total) into the vein daily.    . vitamin C (ASCORBIC ACID) 500 MG tablet Take 500 mg by mouth 2 (two) times daily.    . Vitamin D, Ergocalciferol, (DRISDOL) 50000 UNITS CAPS Take 50,000 Units by mouth every Tuesday.      Facility-Administered Medications Prior to Visit  Medication Dose Route Frequency Provider Last Rate Last Dose  . ertapenem (INVANZ) injection 1,000 mg  1 g Intravenous Q24H Michel Bickers, MD         Allergies  Allergen Reactions  . Penicillins Anaphylaxis    THROAT SWELLING  Has patient had a PCN reaction causing immediate rash, facial/tongue/throat swelling, SOB or lightheadedness with hypotension:  # # YES # #  Has patient had a PCN reaction causing severe rash involving mucus membranes or skin necrosis: # # # YES # # # Has patient had a PCN reaction that required hospitalization:No Has patient had a PCN reaction occurring within the last 10 years:  # # YES # #  If all of the above answers are "NO", then may proceed  with Cephalosporin use.   [place] [Denosumab] Other (See Comments)    Severe Pain in the groin area.  . Ceftriaxone     Possible cause of neutropenia  . Fosamax [Alendronate Sodium]     UNSPECIFIED REACTION   . Tubersol [Tuberculin Ppd] Other (See Comments)    Unknown     Social History   Tobacco Use  . Smoking status: Former Smoker    Packs/day: 0.50    Years: 30.00    Pack years: 15.00    Types: Cigarettes    Last attempt to quit: 08/09/1981    Years since quitting: 36.4  . Smokeless tobacco: Never Used  Substance Use Topics  . Alcohol use: No  . Drug use: No    Family History  Problem Relation Age of Onset  . Heart attack Father        1951-deceased   . Liver disease Mother   . Aneurysm Son 28       Deceased     Objective:  There were no vitals filed for this visit. There is no height or weight on file to calculate BMI.  Physical Exam Constitutional:      Appearance: She is not ill-appearing.  HENT:     Mouth/Throat:     Mouth: Mucous membranes are moist.     Pharynx: Oropharynx is clear. No oropharyngeal exudate.  Eyes:     Pupils: Pupils are equal, round, and reactive to light.  Cardiovascular:     Rate and Rhythm: Normal rate.     Heart sounds: No murmur.  Pulmonary:     Effort: Pulmonary effort is normal. No respiratory distress.     Breath sounds: Normal breath sounds.  Abdominal:     General: Abdomen is flat. There is no distension.     Palpations: Abdomen is soft.  Musculoskeletal:       Legs:  Neurological:     Mental Status: She is alert.   RUE PICC line - clean/dry dressing. Insertion site w/o erythema, tenderness, drainage, cording or distal swelling of affected extremity   Lab Results: Lab Results  Component  Value Date   WBC 14.0 (H) 01/24/2018   HGB 10.6 (L) 01/24/2018   HCT 34.1 (L) 01/24/2018   MCV 77.0 (L) 01/24/2018   PLT 397 01/24/2018    Lab Results  Component Value Date   CREATININE 0.95 (H) 12/06/2017   BUN 26 (H)  12/06/2017   NA 142 12/06/2017   K 4.7 12/06/2017   CL 102 12/06/2017   CO2 28 12/06/2017    Lab Results  Component Value Date   ALT 23 06/23/2017   AST 14 06/23/2017   ALKPHOS 131 (H) 06/07/2017   BILITOT 0.3 06/23/2017     Assessment & Plan:   Problem List Items Addressed This Visit      Unprioritized   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 I am not certain we will beat this infection without control of drainage; difficult situation with poor surgical candidacy. Will await wound culture for any new information. ?repeating CT scan vs MRI to re-image abscess cavity. I would like to get her PICC line out as soon as possible.  Will send order to extend current antibiotic therapy another 3 weeks while we await wound culture and discussion with surgery team.       Relevant Orders   CBC (Completed)   C-reactive protein   Sedimentation rate (Completed)   Medication monitoring encounter    Check CRP, CBC, ESR today.       Left buttock abscess - Primary    Still with pretty large volume murky drainage (although reported to be not as thick/decreased since starting antibiotics). Will provide an update to Dr. Windle Guard; I am not certain if alternative packing would be helpful for her but currently iodoform packing just seems to be plugging the sinus and retains contents between packing changes. ?role for percutaneous drainage with closed-system drain.      Relevant Orders   Wound culture   Diarrhea    CDiff testing negative - presume antibiotic associated. Will have her try imodium OTC for relief of symptoms.       S/P PICC central line placement    Continue care and maintenance per [facility] routine. Does not have blood return but flushes adequately. I am hopeful we can get this out soon; if need to continue will see if we can  do home tPA instillation.         Janene Madeira, MSN, NP-C [facility] for Infectious [place] Pager: [phone] Office: [phone]  01/24/18  5:48 PM

## 2018-01-24 NOTE — Assessment & Plan Note (Addendum)
Still with pretty large volume murky drainage (although reported to be not as thick/decreased since starting antibiotics). Will provide an update to Dr. Doylene Canard; I am not certain if alternative packing would be helpful for her but currently iodoform packing just seems to be plugging the sinus and retains contents between packing changes. ?role for percutaneous drainage with closed-system drain.

## 2018-01-24 NOTE — Assessment & Plan Note (Signed)
CDiff testing negative - presume antibiotic associated. Will have her try imodium OTC for relief of symptoms.

## 2018-01-24 NOTE — Assessment & Plan Note (Signed)
Continue care and maintenance per [facility] routine. Does not have blood return but flushes adequately. I am hopeful we can get this out soon; if need to continue will see if we can do home tPA instillation.

## 2018-01-24 NOTE — Assessment & Plan Note (Signed)
Check CRP, CBC, ESR today.

## 2018-01-24 NOTE — Patient Instructions (Addendum)
Continue treatment with current antibiotics for another 3 weeks pending wound culture from today.   Please make a follow up appointment with Dr. Orvan Falconer again in 2 weeks to reassess progress.   Continue packing wound as you are - it measured 5.8 cm deep today.   OK to use imodium per package instructions for diarrhea.

## 2018-01-24 NOTE — Assessment & Plan Note (Addendum)
WBCs have remained elevated at 17.4 (today's labs pending); she is only on prednisone 5 mg daily and I don't think this would be creatinine steady and w/o acute rise (0.7). I am not certain we will beat this infection without control of drainage; difficult situation with poor surgical candidacy. Will await wound culture for any new information. ?repeating CT scan vs MRI to re-image abscess cavity. I would like to get her PICC line out as soon as possible.  Will send order to extend current antibiotic therapy another 3 weeks while we await wound culture and discussion with surgery team.

## 2018-01-25 LAB — CBC
HCT: 34.1 % — ABNORMAL LOW (ref 35.0–45.0)
Hemoglobin: 10.6 g/dL — ABNORMAL LOW (ref 11.7–15.5)
MCH: 23.9 pg — ABNORMAL LOW (ref 27.0–33.0)
MCHC: 31.1 g/dL — AB (ref 32.0–36.0)
MCV: 77 fL — AB (ref 80.0–100.0)
MPV: 9.9 fL (ref 7.5–12.5)
PLATELETS: 397 10*3/uL (ref 140–400)
RBC: 4.43 10*6/uL (ref 3.80–5.10)
RDW: 15.1 % — ABNORMAL HIGH (ref 11.0–15.0)
WBC: 14 10*3/uL — ABNORMAL HIGH (ref 3.8–10.8)

## 2018-01-25 LAB — C-REACTIVE PROTEIN: CRP: 40.1 mg/L — ABNORMAL HIGH (ref <8.0)

## 2018-01-25 LAB — SEDIMENTATION RATE: SED RATE: 31 mm/h — AB (ref 0–30)

## 2018-01-26 ENCOUNTER — Telehealth: Payer: Self-pay | Admitting: Infectious Diseases

## 2018-01-26 LAB — WOUND CULTURE
MICRO NUMBER:: 91556511
RESULT: NO GROWTH
SPECIMEN QUALITY:: ADEQUATE

## 2018-01-26 NOTE — Telephone Encounter (Signed)
Left non-specific voicemail to patient requesting call back to discuss results of recent tests.   Discussed with Dr.Campbell about repeating imaging of the hip with ongoing sinus drainage and tracking despite antibiotics. I feel strongly she has non-viable tissue that is underneath pinhole incision that would be best served with debriding the site paired with antibiotics but not certain Andrea Yu will agree to this. The IV antibiotics do not seem to be offering her much benefit at this point. I placed a call to her daughter Andrea Yu Braun requesting call back.   Andrea Alberts, MSN, NP-C [facility] for Infectious Disease [facility]  [email or web address] Pager: [phone]

## 2018-01-27 ENCOUNTER — Telehealth: Payer: Self-pay | Admitting: Infectious Diseases

## 2018-01-27 DIAGNOSIS — M869 Osteomyelitis, unspecified: Secondary | ICD-10-CM

## 2018-01-27 NOTE — Telephone Encounter (Signed)
Called to discuss with Himani about persistently elevated inflammatory markers, WBC count and drainage despite prolonged broad empiric IV antibiotics is not likely that we will cure infection in this matter let alone suppress anything. No culture data and chronically draining sinus tract likely overlying larger defect in hip with communication to hip from the best I can tell.   I discussed that Dr. Orvan Falconer and I have spoken and recommend her reconsidering the likelihood that she will need surgery for a good outcome here. She understands and will talk with her daughter Clydie Braun.   They have a follow up appt with Dr. Doylene Canard January 14th. Will try to get repeated MRI by that time to re-evaluate site.   I asked her daughter to call back to speak with either myself or Dr. Orvan Falconer should she have further questions.

## 2018-01-30 ENCOUNTER — Telehealth: Payer: Self-pay | Admitting: Behavioral Health

## 2018-01-30 NOTE — Telephone Encounter (Signed)
-----   Message from Blanchard Kelch, NP sent at 01/26/2018  2:52 PM EST ----- Can you please fax these notes to Dr. Fredricka Bonine at [facility]? Thank you very much

## 2018-01-30 NOTE — Telephone Encounter (Signed)
Thank you :)

## 2018-01-31 ENCOUNTER — Telehealth: Payer: Self-pay | Admitting: Behavioral Health

## 2018-01-31 ENCOUNTER — Telehealth: Payer: Self-pay | Admitting: Infectious Diseases

## 2018-01-31 NOTE — Telephone Encounter (Signed)
Attempted call x 3 to listed number - no option to leave message or connect to extension.  I called Karen's cell phone explaining inability to reach her at this number provided and requested call back to triage.

## 2018-01-31 NOTE — Telephone Encounter (Signed)
Called to discuss with Andrea Yu about lack of improvement overall with prolonged broad empiric antibiotic coverage. Her inflammatory markers have remained persistently elevated as well as her WBC counts - she does have other chronic inflammatory conditions that could explain but her lack of clinical improvement in wound drainage confirms that this is not likely going to be enough to cure her.   Andrea Yu feels "broken and that she cannot be fixed" and cannot take the medications she needs for her pain with this infection and is just frustrated.   I told her that after discussion with Andrea Yu will repeat MRI to evaluate for any changes. She will follow up with Andrea Yu as planned in 7 days who is familiar with Andrea Yu from previous infection in May-2019. MRI order sent to [facility] per insurance request on [employer] - alerted [place] today.   Andrea Alberts, MSN, NP-C [facility] for Infectious Disease [facility]  [email or web address] Pager: [phone]

## 2018-01-31 NOTE — Telephone Encounter (Signed)
Patient's daughter called stating she was told to call [name] back.  She left her call back number  [phone] ext [phone] or she states have to operator to page her. Angeline Slim RN

## 2018-02-07 ENCOUNTER — Ambulatory Visit: Payer: Medicare Other | Admitting: Infectious Diseases

## 2018-02-07 ENCOUNTER — Ambulatory Visit (INDEPENDENT_AMBULATORY_CARE_PROVIDER_SITE_OTHER): Payer: Medicare Other | Admitting: Internal Medicine

## 2018-02-07 ENCOUNTER — Encounter: Payer: Self-pay | Admitting: Internal Medicine

## 2018-02-07 VITALS — BP 84/57 | HR 87 | Temp 97.9°F

## 2018-02-07 DIAGNOSIS — Z23 Encounter for immunization: Secondary | ICD-10-CM

## 2018-02-07 DIAGNOSIS — Z95828 Presence of other vascular implants and grafts: Secondary | ICD-10-CM

## 2018-02-07 DIAGNOSIS — L0231 Cutaneous abscess of buttock: Secondary | ICD-10-CM

## 2018-02-07 DIAGNOSIS — M869 Osteomyelitis, unspecified: Secondary | ICD-10-CM

## 2018-02-07 NOTE — Assessment & Plan Note (Signed)
picc line inadvertently out and will leave out pending above.

## 2018-02-07 NOTE — Progress Notes (Signed)
   Subjective:    Patient ID: Andrea Yu, female    DOB: October 06, 1940, 78 y.o.   MRN: [id number]  HPI Here for follow up of buttock abscess. Has a history of group B Streptococcal bacteremia and pelvic osteomyelitis and T10-11 discitis and osteomyelitis and treated for a prolonged course with initially levofloxacin then ceftriaxone.  She did develop some leukopenia on ceftriaxone and switched to vancomycin and cipro added completed therapy 06/23/17.  In August she developed swelling of left buttock and saw Dr. Doylene Canard of [facility] and underwent I and D and noted brown, murky fluid.  No cultures sent and was on Bactrim for this.  Did note a draining sinus tract then.  She went back to Dr. Doylene Canard who referred her back to [place] and saw my partner, Dr. Orvan Falconer.  No surgery was done at the time with hopes of medical therapy only.  She was started on broad antibiotics with vancomycin and ertapenem and has been on this for over 6 weeks.  Her picc line came out last Friday. Had MRI today.    Review of Systems  Constitutional: Negative for chills, fever and unexpected weight change.  Gastrointestinal: Negative for diarrhea and nausea.  Skin: Negative for rash.       Objective:   Physical Exam Constitutional:      Appearance: Normal appearance.  Eyes:     General: No scleral icterus. Cardiovascular:     Rate and Rhythm: Normal rate and regular rhythm.     Heart sounds: No murmur.  Pulmonary:     Effort: Pulmonary effort is normal. No respiratory distress.     Breath sounds: Normal breath sounds.  Neurological:     Mental Status: She is alert.  Psychiatric:        Mood and Affect: Mood normal.    SH: no tobacco       Assessment & Plan:

## 2018-02-07 NOTE — Assessment & Plan Note (Signed)
Persistent drainage despite prolonged antibiotic therapy.

## 2018-02-07 NOTE — Assessment & Plan Note (Signed)
MRI reviewed after the appointment.  Has osteomyelitis of pelvis.  Clinically still with drainage and not changing.  Needs debridement.  She has previously seen Dr. Doylene Canard who sent her here so will re-refer her back to get in and surgical management. I will have her hold on starting any antibiotics until surgical cultures are obtained and then will need to start over with prolonged, likely IV, therapy for treatment.  She and her daughter knows to let us know when surgery is taking place.

## 2018-05-15 ENCOUNTER — Other Ambulatory Visit: Payer: Self-pay | Admitting: Cardiovascular Disease

## 2018-05-16 NOTE — Telephone Encounter (Signed)
Refills deferred to MD. Patient not seen in 3 years. Message sent to schedulers appointment needed.

## 2018-05-24 ENCOUNTER — Telehealth: Payer: Self-pay | Admitting: Cardiovascular Disease

## 2018-05-25 ENCOUNTER — Telehealth (INDEPENDENT_AMBULATORY_CARE_PROVIDER_SITE_OTHER): Payer: Medicare Other | Admitting: Cardiovascular Disease

## 2018-05-25 VITALS — BP 101/79 | HR 60 | Temp 98.8°F | Ht 59.0 in | Wt 144.0 lb

## 2018-05-25 DIAGNOSIS — E669 Obesity, unspecified: Secondary | ICD-10-CM

## 2018-05-25 DIAGNOSIS — I480 Paroxysmal atrial fibrillation: Secondary | ICD-10-CM

## 2018-05-25 DIAGNOSIS — I4821 Permanent atrial fibrillation: Secondary | ICD-10-CM | POA: Diagnosis not present

## 2018-05-25 DIAGNOSIS — I10 Essential (primary) hypertension: Secondary | ICD-10-CM

## 2018-05-25 MED ORDER — DILTIAZEM HCL ER COATED BEADS 120 MG PO CP24: 120.0000 mg | ORAL_CAPSULE | Freq: Every day | ORAL | 3 refills | Status: DC

## 2018-05-25 NOTE — Patient Instructions (Signed)
Medication Instructions:  Decrease Diltiazem to 120 mg daily New prescription sent to pharmacy If you need a refill on your cardiac medications before your next appointment, please call your pharmacy.   Lab work: None ordered   Testing/Procedures: None ordered  Follow-Up: At [facility], you and your health needs are our priority.  As part of our continuing mission to provide you with exceptional heart care, we have created designated Provider Care Teams.  These Care Teams include your primary Cardiologist (physician) and Advanced Practice Providers (APPs -  Physician Assistants and Nurse Practitioners) who all work together to provide you with the care you need, when you need it. . Schedule follow up with Dr.Croitoru in 12 months   Call 3 months before to schedule

## 2018-05-25 NOTE — Progress Notes (Signed)
Virtual Visit via Telephone Note   This visit type was conducted due to national recommendations for restrictions regarding the COVID-19 Pandemic (e.g. social distancing) in an effort to limit this patient's exposure and mitigate transmission in our community.  Due to her co-morbid illnesses, this patient is at least at moderate risk for complications without adequate follow up.  This format is felt to be most appropriate for this patient at this time.  The patient did not have access to video technology/had technical difficulties with video requiring transitioning to audio format only (telephone).  All issues noted in this document were discussed and addressed.  No physical exam could be performed with this format.  Please refer to the patient's chart for her  consent to [facility].   Evaluation Performed:  Follow-up visit  Date:  05/25/2018   ID:  Andrea Yu, DOB 06/13/1940, MRN [id number]  Patient Location: Home Provider Location: Home  PCP:  Jarome MatinPaterson, Daniel, MD  Cardiologist:  Thurmon FairMihai Darden Flemister, MD  Electrophysiologist:  None   Chief Complaint:  AFib follow up  History of Present Illness:    Andrea Yu is a 78 y.o. female with a history of paroxysmal atrial fibrillation and systemic hypertension, with longstanding problems of psoriatic arthritis, orthostatic hypotension and poor balance due to peripheral neuropathy leading to frequent falls with serious complications including traumatic subarachnoid hemorrhage.  She is therefore not on anticoagulation.    Her husband [name] died roughly 6 months ago, after experiencing repeated episodes of severe bleeding due to intractable thrombocytopenia.  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  She had several bouts of serious illness in 2019 and lost about 26 pounds.  Weight loss has stabilized over the last 6 months.  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  Both wounds are still open, but gradually healing.  She is no longer on antibiotics.  This is her first cardiology encounter since August 2017.  At her last appointment with me she was in atrial fibrillation with controlled ventricular rate and appeared oblivious to the arrhythmia.  Whenever she goes to the wound care clinic, did tell her she is in atrial fibrillation, but she is never tachycardic.  Her blood pressure tends to run low, with systolics often in the 90s.  She has not had problems with dizziness, lightheadedness and syncope.  She almost never gets up and has not had any falls.  The patient does not have symptoms concerning for COVID-19 infection (fever, chills, cough, or new shortness of breath).    Past Medical History:  Diagnosis Date  . A-fib (HCC)    "just after knee 2nd OR" (11/07/2012)  . Anemia   . Arthritis    RA , SACROTIC ARTHRITIS  . Complication of anesthesia    found to be in afib when she woke up  . Dysrhythmia   . GERD (gastroesophageal reflux disease)   . Head injury, closed, with concussion   . History of blood transfusion    "after 1 of my knee ORs and today" (11/07/2012)  . Hypertension   . Peripheral neuropathy    "from back OR in 2005; in hands and feet" (11/07/2012)  . Psoriatic arthritis (HCC)    on Prednisone Rx   Past Surgical History:  Procedure Laterality Date  . ANKLE FUSION  Right 01/07/2016   Procedure: Right Tibiocalcaneal Fusion;  Surgeon: Nadara Mustard, MD;  Location: [facility] OR;  Service: Orthopedics;  Laterality: Right;  . BACK SURGERY    . CATARACT EXTRACTION W/ INTRAOCULAR LENS  IMPLANT, BILATERAL Bilateral 2007  . CHOLECYSTECTOMY  2000's  . COLONOSCOPY N/A 11/10/2012   Procedure: COLONOSCOPY;  Surgeon: Theda Belfast, MD;  Location[facility] ENDOSCOPY;  Service: Endoscopy;  Laterality: N/A;  . ESOPHAGOGASTRODUODENOSCOPY N/A 11/10/2012    Procedure: ESOPHAGOGASTRODUODENOSCOPY (EGD);  Surgeon: Theda Belfast, MD;  Location: [facility] ENDOSCOPY;  Service: Endoscopy;  Laterality: N/A;  . EYE SURGERY    . JOINT REPLACEMENT    . POSTERIOR LUMBAR FUSION  2005   "got a rod and 4 pins in there" (11/07/2012)  . REPLACEMENT TOTAL KNEE BILATERAL Bilateral 2007  . TRANSTHORACIC ECHOCARDIOGRAM  05/24/2006  . TUBAL LIGATION  1974  . VAGINAL HYSTERECTOMY  1983     Current Meds  Medication Sig  . acetaminophen (TYLENOL) 325 MG tablet Take 2 tablets (650 mg total) by mouth every 4 (four) hours as needed. (Patient taking differently: Take 325 mg by mouth every 6 (six) hours as needed (for pain.). )  . Apremilast (OTEZLA) 30 MG TABS Take 30 mg by mouth 2 (two) times daily.  Marland Kitchen aspirin EC 81 MG tablet Take 81 mg by mouth every evening.  Marland Kitchen DILT-XR 180 MG 24 hr capsule Take 180 mg by mouth at bedtime.  . DULoxetine (CYMBALTA) 30 MG capsule Take 90 mg by mouth daily. Take along with 60 mg capsule=90 mg  . DULoxetine (CYMBALTA) 60 MG capsule Take 90 mg by mouth daily. Take along with 30mg  capsule to make a total of 90mg    . FORTEO 600 MCG/2.4ML SOLN Inject 20 mcg into the skin daily.   . [DISCONTINUED] furosemide (LASIX) 40 MG tablet TAKE 2 TABLETS BY MOUTH  DAILY  . [DISCONTINUED] metoprolol succinate (TOPROL-XL) 100 MG 24 hr tablet TAKE 1 TABLET BY MOUTH  DAILY.   Current Facility-Administered Medications for the 05/25/18 encounter (Telemedicine) with Thurmon Fair, MD  Medication  . ertapenem [facility]) injection 1,000 mg     Allergies:   Penicillins; Prolia [denosumab]; Ceftriaxone; Fosamax [alendronate sodium]; and Tubersol [tuberculin ppd]   Social History   Tobacco Use  . Smoking status: Former Smoker    Packs/day: 0.50    Years: 30.00    Pack years: 15.00    Types: Cigarettes    Last attempt to quit: 08/09/1981    Years since quitting: 36.8  . Smokeless tobacco: Never Used  Substance Use Topics  . Alcohol use: No  . Drug use: No      Family Hx: The patient's family history includes Aneurysm (age of onset: 28) in her son; Heart attack in her father; Liver disease in her mother.  ROS:   Please see the history of present illness.     All other systems reviewed and are negative.   Prior CV studies:   The following studies were reviewed today:  Echocardiogram April 19, 2017  showed normal left ventricular size and systolic function, EF 50-55%, no serious valvular abnormalities.  The left atrium was at least moderately dilated.  Lower extremity arterial Dopplers performed September 05, 2015 showed bilateral diffuse SFA disease without focal stenosis, two-vessel runoff on the right (occluded peroneal), single-vessel runoff on the left (occluded peroneal and posterior tibial).  ABI was 1.6-1.7 bilaterally.  Labs/Other Tests and Data Reviewed:    EKG:  An ECG dated Jun 06, 2017  was personally reviewed today and demonstrated:  Atrial fibrillation with rapid ventricular response.  At that point she was hospitalized and very ill.  Recent Labs: 06/07/2017: TSH 1.339 06/23/2017: ALT 23 12/06/2017: BUN 26; Creat 0.95; Potassium 4.7; Sodium 142 01/24/2018: Hemoglobin 10.6; Platelets 397   Recent Lipid Panel No results found for: CHOL, TRIG, HDL, CHOLHDL, LDLCALC, LDLDIRECT  Wt Readings from Last 3 Encounters:  05/25/18 144 lb (65.3 kg)  12/08/17 148 lb (67.1 kg)  12/06/17 143 lb (64.9 kg)     Objective:    Vital Signs:  BP 101/79   Pulse 60   Temp 98.8 F (37.1 C)   Ht 4\' 11"  (1.499 m)   Wt 144 lb (65.3 kg)   SpO2 99%   BMI 29.08 kg/m    VITAL SIGNS:  reviewed unable to examine  ASSESSMENT & PLAN:    1. AFib: Currently with arrhythmia, appears to be well rate controlled.  Blood pressure is running rather low.  We will try to decrease the diltiazem to 120 mg once daily.  Continue same dose of metoprolol.  Not on anticoagulation due to frequent falls with serious injuries including subarachnoid hemorrhage.   Continue aspirin.  High blood pressure is no longer a problem, at this point she actually appears to be hypotensive most of the time.  COVID-19 Education: The signs and symptoms of COVID-19 were discussed with the patient and how to seek care for testing (follow up with PCP or arrange E-visit).  The importance of social distancing was discussed today.  Time:   Today, I have spent 16 minutes with the patient with telehealth technology discussing the above problems.     Medication Adjustments/Labs and Tests Ordered: Current medicines are reviewed at length with the patient today.  Concerns regarding medicines are outlined above.   Tests Ordered: No orders of the defined types were placed in this encounter.   Medication Changes: No orders of the defined types were placed in this encounter.   Disposition:  Follow up 12 months  Signed, Thurmon Fair, MD  05/25/2018 1:29 PM    [facility]

## 2018-05-25 NOTE — Addendum Note (Signed)
Addended by: Neoma Laming on: 05/25/2018 02:37 PM   Modules accepted: Orders

## 2018-07-19 ENCOUNTER — Other Ambulatory Visit: Payer: Self-pay | Admitting: Cardiovascular Disease

## 2018-07-20 MED ORDER — METOPROLOL SUCCINATE ER 50 MG PO TB24: 50.0000 mg | ORAL_TABLET | Freq: Every day | ORAL | 3 refills | Status: DC

## 2018-07-24 NOTE — Telephone Encounter (Signed)
Opened in error

## 2018-07-26 ENCOUNTER — Telehealth: Payer: Self-pay | Admitting: *Deleted

## 2018-07-26 NOTE — Telephone Encounter (Signed)
Left a message for the patient to call back to clarify her Metoprolol dose.

## 2018-08-24 NOTE — Telephone Encounter (Signed)
Left a third message for the patient to call back. Encounter will be closed.   

## 2018-08-24 NOTE — Telephone Encounter (Signed)
Left a message for the patient to call back.  

## 2018-09-04 NOTE — Addendum Note (Signed)
Addended by: Ricci Barker on: 09/04/2018 12:26 PM   Modules accepted: Orders

## 2018-09-04 NOTE — Telephone Encounter (Signed)
The patient returned the phone call. Per [facility], Metoprolol 100 mg once daily and Metoprolol 50 mg once daily were both sent in for refills.  The patient stated that she takes the Metoprolol 50 mg once daily along with Diltiazem 120 mg and Losartan 100 mg.  She is unable to check her blood pressures at home but did have these readings.  Blood pressure on 7/30-104/58 Blood pressure on 8/6-106/61   Metoprolol Succinate 100 mg has been taken off of her list and [facility] has been notified.

## 2018-10-16 ENCOUNTER — Other Ambulatory Visit: Payer: Self-pay | Admitting: Cardiovascular Disease

## 2018-10-30 ENCOUNTER — Other Ambulatory Visit: Payer: Self-pay

## 2018-10-31 ENCOUNTER — Other Ambulatory Visit: Payer: Self-pay

## 2018-10-31 MED ORDER — METOPROLOL SUCCINATE ER 50 MG PO TB24: 50.0000 mg | ORAL_TABLET | Freq: Every day | ORAL | 3 refills | Status: DC

## 2019-02-18 ENCOUNTER — Other Ambulatory Visit: Payer: Self-pay | Admitting: Cardiovascular Disease

## 2019-04-29 ENCOUNTER — Other Ambulatory Visit: Payer: Self-pay | Admitting: Cardiovascular Disease

## 2019-05-15 ENCOUNTER — Other Ambulatory Visit: Payer: Self-pay | Admitting: Cardiovascular Disease

## 2019-05-17 ENCOUNTER — Encounter: Payer: Self-pay | Admitting: Cardiovascular Disease

## 2019-05-17 ENCOUNTER — Telehealth (INDEPENDENT_AMBULATORY_CARE_PROVIDER_SITE_OTHER): Payer: Medicare Other | Admitting: Cardiovascular Disease

## 2019-05-17 ENCOUNTER — Telehealth: Payer: Self-pay | Admitting: *Deleted

## 2019-05-17 VITALS — BP 126/78 | HR 76 | Ht 59.0 in

## 2019-05-17 DIAGNOSIS — Z8739 Personal history of other diseases of the musculoskeletal system and connective tissue: Secondary | ICD-10-CM

## 2019-05-17 DIAGNOSIS — Z7982 Long term (current) use of aspirin: Secondary | ICD-10-CM

## 2019-05-17 DIAGNOSIS — I10 Essential (primary) hypertension: Secondary | ICD-10-CM | POA: Diagnosis not present

## 2019-05-17 DIAGNOSIS — I4821 Permanent atrial fibrillation: Secondary | ICD-10-CM | POA: Insufficient documentation

## 2019-05-17 DIAGNOSIS — L4050 Arthropathic psoriasis, unspecified: Secondary | ICD-10-CM

## 2019-05-17 DIAGNOSIS — I480 Paroxysmal atrial fibrillation: Secondary | ICD-10-CM | POA: Diagnosis not present

## 2019-05-17 DIAGNOSIS — Z87891 Personal history of nicotine dependence: Secondary | ICD-10-CM

## 2019-05-17 DIAGNOSIS — E785 Hyperlipidemia, unspecified: Secondary | ICD-10-CM

## 2019-05-17 NOTE — Telephone Encounter (Signed)
  Patient Consent for Virtual Visit         Andrea Yu has provided verbal consent on 05/17/2019 for a virtual visit (video or telephone).   CONSENT FOR VIRTUAL VISIT FOR:  Andrea Yu  By participating in this virtual visit I agree to the following:  I hereby voluntarily request, consent and authorize [facility] and its employed or contracted physicians, physician assistants, nurse practitioners or other licensed health care professionals (the Practitioner), to provide me with telemedicine health care services (the "Services") as deemed necessary by the treating Practitioner. I acknowledge and consent to receive the Services by the Practitioner via telemedicine. I understand that the telemedicine visit will involve communicating with the Practitioner through live audiovisual communication technology and the disclosure of certain medical information by electronic transmission. I acknowledge that I have been given the opportunity to request an in-person assessment or other available alternative prior to the telemedicine visit and am voluntarily participating in the telemedicine visit.  I understand that I have the right to withhold or withdraw my consent to the use of telemedicine in the course of my care at any time, without affecting my right to future care or treatment, and that the Practitioner or I may terminate the telemedicine visit at any time. I understand that I have the right to inspect all information obtained and/or recorded in the course of the telemedicine visit and may receive copies of available information for a reasonable fee.  I understand that some of the potential risks of receiving the Services via telemedicine include:  Marland Kitchen Delay or interruption in medical evaluation due to technological equipment failure or disruption; . Information transmitted may not be sufficient (e.g. poor resolution of images) to allow for appropriate medical decision making by the Practitioner;  and/or  . In rare instances, security protocols could fail, causing a breach of personal health information.  Furthermore, I acknowledge that it is my responsibility to provide information about my medical history, conditions and care that is complete and accurate to the best of my ability. I acknowledge that Practitioner's advice, recommendations, and/or decision may be based on factors not within their control, such as incomplete or inaccurate data provided by me or distortions of diagnostic images or specimens that may result from electronic transmissions. I understand that the practice of medicine is not an exact science and that Practitioner makes no warranties or guarantees regarding treatment outcomes. I acknowledge that a copy of this consent can be made available to me via my patient portal [facility] MyChart), or I can request a printed copy by calling the office of [facility].    I understand that my insurance will be billed for this visit.   I have read or had this consent read to me. . I understand the contents of this consent, which adequately explains the benefits and risks of the Services being provided via telemedicine.  . I have been provided ample opportunity to ask questions regarding this consent and the Services and have had my questions answered to my satisfaction. . I give my informed consent for the services to be provided through the use of telemedicine in my medical care

## 2019-05-17 NOTE — Patient Instructions (Addendum)
Medication Instructions:  No changes *If you need a refill on your cardiac medications before your next appointment, please call your pharmacy*   Lab Work: None ordered If you have labs (blood work) drawn today and your tests are completely normal, you will receive your results only by: MyChart Message (if you have MyChart) OR A paper copy in the mail If you have any lab test that is abnormal or we need to change your treatment, we will call you to review the results.   Testing/Procedures: None ordered   Follow-Up: At [facility], you and your health needs are our priority.  As part of our continuing mission to provide you with exceptional heart care, we have created designated Provider Care Teams.  These Care Teams include your primary Cardiologist (physician) and Advanced Practice Providers (APPs -  Physician Assistants and Nurse Practitioners) who all work together to provide you with the care you need, when you need it.  We recommend signing up for the patient portal called "MyChart".  Sign up information is provided on this After Visit Summary.  MyChart is used to connect with patients for Virtual Visits (Telemedicine).  Patients are able to view lab/test results, encounter notes, upcoming appointments, etc.  Non-urgent messages can be sent to your provider as well.   To learn more about what you can do with MyChart, go to [email or web address].    Your next appointment:   6 month(s)  The format for your next appointment:   In Person  Provider:   You may see Mihai Croitoru, MD or one of the following Advanced Practice Providers on your designated Care Team:   Hao Meng, PA-C Angela Duke, PA-C or  Krista Kroeger, PA-C   

## 2019-05-17 NOTE — Progress Notes (Signed)
Virtual Visit via Telephone Note   This visit type was conducted due to national recommendations for restrictions regarding the COVID-19 Pandemic (e.g. social distancing) in an effort to limit this patient's exposure and mitigate transmission in our community.  Due to her co-morbid illnesses, this patient is at least at moderate risk for complications without adequate follow up.  This format is felt to be most appropriate for this patient at this time.  The patient did not have access to video technology/had technical difficulties with video requiring transitioning to audio format only (telephone).  All issues noted in this document were discussed and addressed.  No physical exam could be performed with this format.  Please refer to the patient's chart for her  consent to telehealth for [facility].   Evaluation Performed:  Follow-up visit  Date:  05/17/2019   ID:  Andrea Yu, DOB 06-09-1940, MRN [id number]  Patient Location: Home Provider Location: Home  PCP:  Jarome Matin, MD  Cardiologist:  Thurmon Fair, MD  Electrophysiologist:  None   Chief Complaint:  AFib follow up  History of Present Illness:    Andrea Yu is a 79 y.o. female with a history of paroxysmal atrial fibrillation and systemic hypertension, with longstanding problems of psoriatic arthritis, orthostatic hypotension and poor balance due to peripheral neuropathy leading to frequent falls with serious complications including traumatic subarachnoid hemorrhage, chronic stasis ulcers of the buttock and right heel.  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  She cannot really walk due to a foot problem, that cannot be corrected until after her wounds heal.  She has been going to the wound clinic for over a year and there is slow progress.  Her daughter spends the night with her and she has a caregiver during the day.    She has gained a little bit of weight back, after losing about  30 pounds during critical illness in 2019.  She remains in the overweight category..  She has no cardiovascular complaints.  She remains unaware of palpitations.  Her heart rate is typically in the 60s and 70s.  The patient specifically denies any chest pain at rest exertion, dyspnea at rest or with exertion, orthopnea, paroxysmal nocturnal dyspnea, syncope, palpitations, focal neurological deficits, intermittent claudication, lower extremity edema, unexplained weight gain, cough, hemoptysis or wheezing.  The patient does not have symptoms concerning for COVID-19 infection (fever, chills, cough, or new shortness of breath).    Past Medical History:  Diagnosis Date  . A-fib (HCC)    "just after knee 2nd OR" (11/07/2012)  . Anemia   . Arthritis    RA , SACROTIC ARTHRITIS  . Complication of anesthesia    found to be in afib when she woke up  . Dysrhythmia   . GERD (gastroesophageal reflux disease)   . Head injury, closed, with concussion   . History of blood transfusion    "after 1 of my knee ORs and today" (11/07/2012)  . Hypertension   . Peripheral neuropathy    "from back OR in 2005; in hands and feet" (11/07/2012)  . Psoriatic arthritis (HCC)    on Prednisone Rx   Past Surgical History:  Procedure Laterality Date  . ANKLE FUSION Right 01/07/2016   Procedure: Right Tibiocalcaneal Fusion;  Surgeon: Nadara Mustard, MD;  [facility] OR;  Service: Orthopedics;  Laterality: Right;  . BACK SURGERY    . CATARACT EXTRACTION W/ INTRAOCULAR LENS  IMPLANT, BILATERAL Bilateral 2007  .  CHOLECYSTECTOMY  2000's  . COLONOSCOPY N/A 11/10/2012   Procedure: COLONOSCOPY;  Surgeon: Beryle Beams, MD;  Location: [place];  Service: Endoscopy;  Laterality: N/A;  . ESOPHAGOGASTRODUODENOSCOPY N/A 11/10/2012   Procedure: ESOPHAGOGASTRODUODENOSCOPY (EGD);  Surgeon: Beryle Beams, MD;  Location: [facility] ENDOSCOPY;  Service: Endoscopy;  Laterality: N/A;  . EYE SURGERY    . JOINT REPLACEMENT    .  POSTERIOR LUMBAR FUSION  2005   "got a rod and 4 pins in there" (11/07/2012)  . REPLACEMENT TOTAL KNEE BILATERAL Bilateral 2007  . TRANSTHORACIC ECHOCARDIOGRAM  05/24/2006  . TUBAL LIGATION  1974  . VAGINAL HYSTERECTOMY  1983     Current Meds  Medication Sig  . acetaminophen (TYLENOL) 325 MG tablet Take 2 tablets (650 mg total) by mouth every 4 (four) hours as needed. (Patient taking differently: Take 325 mg by mouth every 6 (six) hours as needed (for pain.). )  . Amino Acids-Protein Hydrolys (FEEDING SUPPLEMENT, PRO-STAT SUGAR FREE 64,) LIQD Take 30 mLs by mouth 2 (two) times daily.  Marland Kitchen Apremilast (OTEZLA) 30 MG TABS Take 30 mg by mouth 2 (two) times daily.  Marland Kitchen aspirin EC 81 MG tablet Take 81 mg by mouth every evening.  . Biotin 5000 MCG CAPS Take by mouth 2 (two) times a day.  . diltiazem (CARDIZEM CD) 120 MG 24 hr capsule Take 1 capsule (120 mg total) by mouth daily. Please make annual appt in April for refills. Thank you  . DULoxetine (CYMBALTA) 30 MG capsule Take 90 mg by mouth daily. Take along with 60 mg capsule=90 mg  . DULoxetine (CYMBALTA) 60 MG capsule Take 90 mg by mouth daily. Take along with 30mg  capsule to make a total of 90mg    . Flaxseed, Linseed, (FLAX SEED OIL PO) Take by mouth. Take 1 capsule daily  . FORTEO 600 MCG/2.4ML SOLN Inject 20 mcg into the skin daily.   . furosemide (LASIX) 40 MG tablet TAKE 2 TABLETS BY MOUTH  DAILY (Patient taking differently: 40 mg. )  . gabapentin (NEURONTIN) 600 MG tablet Take 600 mg by mouth 2 (two) times daily.  Marland Kitchen HYDROcodone-acetaminophen (NORCO/VICODIN) 5-325 MG tablet Take 1-2 tablets by mouth every 6 (six) hours as needed for moderate pain or severe pain.  . hydroxypropyl methylcellulose / hypromellose (ISOPTO TEARS / GONIOVISC) 2.5 % ophthalmic solution Place 1 drop into both eyes 3 (three) times daily as needed for dry eyes.  Marland Kitchen losartan (COZAAR) 100 MG tablet Take 100 mg by mouth daily.   . Magnesium 250 MG TABS Take 1 tablet by mouth  every evening.   . methocarbamol (ROBAXIN) 500 MG tablet Take 1 tablet (500 mg total) by mouth every 8 (eight) hours as needed for muscle spasms.  . Multiple Vitamin (MULTIVITAMIN WITH MINERALS) TABS Take 1 tablet by mouth daily.  . Omega-3 Fatty Acids (FISH OIL) 1200 MG CAPS Take by mouth daily.  Marland Kitchen omeprazole (PRILOSEC) 20 MG capsule Take 20 mg by mouth every evening.  . potassium chloride SA (K-DUR,KLOR-CON) 20 MEQ tablet Take 20 mEq by mouth 2 (two) times daily.   . predniSONE (DELTASONE) 5 MG tablet Take 1 tablet (5 mg total) by mouth daily with breakfast. (Patient taking differently: Take 4 mg by mouth daily with breakfast. )  . PROAIR HFA 108 (90 BASE) MCG/ACT inhaler Inhale 1-2 puffs into the lungs every 6 (six) hours as needed for wheezing or shortness of breath.   . Vitamin D, Ergocalciferol, (DRISDOL) 50000 UNITS CAPS Take 50,000 Units by  mouth every Tuesday.    Current Facility-Administered Medications for the 05/17/19 encounter (Telemedicine) with Thurmon Fair, MD  Medication  . ertapenem [facility]) injection 1,000 mg     Allergies:   Penicillins, Prolia [denosumab], Ceftriaxone, Fosamax [alendronate sodium], and Tubersol [tuberculin ppd]   Social History   Tobacco Use  . Smoking status: Former Smoker    Packs/day: 0.50    Years: 30.00    Pack years: 15.00    Types: Cigarettes    Quit date: 08/09/1981    Years since quitting: 37.7  . Smokeless tobacco: Never Used  Substance Use Topics  . Alcohol use: No  . Drug use: No     Family Hx: The patient's family history includes Aneurysm (age of onset: 83) in her son; Heart attack in her father; Liver disease in her mother.  ROS:   Please see the history of present illness.    All other systems are reviewed and are negative.  Prior CV studies:   The following studies were reviewed today:  Echocardiogram April 19, 2017  showed normal left ventricular size and systolic function, EF 50-55%, no serious valvular  abnormalities.  The left atrium was at least moderately dilated.  Lower extremity arterial Dopplers performed September 05, 2015 showed bilateral diffuse SFA disease without focal stenosis, two-vessel runoff on the right (occluded peroneal), single-vessel runoff on the left (occluded peroneal and posterior tibial).  ABI was 1.6-1.7 bilaterally.  Labs/Other Tests and Data Reviewed:    EKG:  An ECG dated Jun 06, 2017 was personally reviewed today and demonstrated:  Atrial fibrillation with rapid ventricular response.  At that point she was hospitalized and very ill.  Recent Labs: No results found for requested labs within last 8760 hours.  10/16/2018 Hemoglobin 10, creatinine 0.8, normal liver function tests Recent Lipid Panel No results found for: CHOL, TRIG, HDL, CHOLHDL, LDLCALC, LDLDIRECT  10/16/2018 Total cholesterol 137, HDL 38, LDL 67, triglycerides 160  Wt Readings from Last 3 Encounters:  05/25/18 144 lb (65.3 kg)  12/08/17 148 lb (67.1 kg)  12/06/17 143 lb (64.9 kg)     Objective:    Vital Signs:  BP 126/78   Pulse 76   Ht 4\' 11"  (1.499 m)   BMI 29.08 kg/m    VITAL SIGNS:  reviewed unable to examine  ASSESSMENT & PLAN:    1. AFib: Asymptomatic arrhythmia.  Appears to be well rate controlled on combination metoprolol and diltiazem.  Due to frequent falls and high bleeding risk (including history of intracranial hemorrhage) she is on aspirin rather than full anticoagulation. 2. HTN: Well-controlled.  In the past she had a lot of problems with orthostatic hypotension.  If she loses more weight and antihypertensive medications need to be decreased, need to decrease the losartan since the other medications also provide ventricular rate control. 3. HLP: Borderline elevated triglycerides and borderline low HDL cholesterol, consistent with metabolic syndrome and sedentary lifestyle.  The LDL cholesterol is in desirable range. 4. History of psoriatic arthritis: On Otezla and  low-dose prednisone.  COVID-19 Education: The signs and symptoms of COVID-19 were discussed with the patient and how to seek care for testing (follow up with PCP or arrange E-visit).  The importance of social distancing was discussed today.  Time:   Today, I have spent 16 minutes with the patient with telehealth technology discussing the above problems.     Medication Adjustments/Labs and Tests Ordered: Current medicines are reviewed at length with the patient today.  Concerns regarding medicines  are outlined above.   Tests Ordered: No orders of the defined types were placed in this encounter.   Medication Changes: No orders of the defined types were placed in this encounter.   Disposition:  Follow up 12 months  Signed, Thurmon Fair, MD  05/17/2019 10:29 AM    [facility]

## 2019-07-07 ENCOUNTER — Other Ambulatory Visit: Payer: Self-pay | Admitting: Cardiovascular Disease

## 2019-12-13 ENCOUNTER — Other Ambulatory Visit: Payer: Self-pay

## 2019-12-13 ENCOUNTER — Encounter: Payer: Self-pay | Admitting: Cardiovascular Disease

## 2019-12-13 ENCOUNTER — Ambulatory Visit (INDEPENDENT_AMBULATORY_CARE_PROVIDER_SITE_OTHER): Payer: Medicare Other | Admitting: Cardiovascular Disease

## 2019-12-13 VITALS — BP 120/67 | HR 76 | Ht 59.0 in | Wt 183.0 lb

## 2019-12-13 DIAGNOSIS — I10 Essential (primary) hypertension: Secondary | ICD-10-CM

## 2019-12-13 DIAGNOSIS — I4821 Permanent atrial fibrillation: Secondary | ICD-10-CM | POA: Diagnosis not present

## 2019-12-13 DIAGNOSIS — E781 Pure hyperglyceridemia: Secondary | ICD-10-CM | POA: Diagnosis not present

## 2019-12-13 DIAGNOSIS — L4050 Arthropathic psoriasis, unspecified: Secondary | ICD-10-CM | POA: Diagnosis not present

## 2019-12-13 NOTE — Patient Instructions (Signed)

## 2019-12-13 NOTE — Progress Notes (Signed)
Cardiology office note   Date:  12/14/2019   ID:  CARL BLEECKER, DOB [birth year], MRN [id number]  PCP:  Jarome Matin, MD  Cardiologist:  Thurmon Fair, MD  Electrophysiologist:  None   Chief Complaint:  AFib follow up  History of Present Illness:    Andrea Yu is a 79 y.o. female with a history of paroxysmal atrial fibrillation and systemic hypertension, with longstanding problems of psoriatic arthritis, orthostatic hypotension and poor balance due to peripheral neuropathy leading to frequent falls with serious complications including traumatic subarachnoid hemorrhage, chronic stasis ulcers of the buttock and right heel.  Despite her numerous health problems she is done quite well over the last several months.  She remains very sedentary, limited to her bed in the electric scooter.  Her daughter is home with her at night and she has a caregiver during the day. She has gained back all the weight that she lost during her critical illness in 2019 and is now moderate to severely obese.  Despite permanent atrial fibrillation she remains unaware of the arrhythmia and has no palpitations.  She has not had syncope, dizziness or focal neurological events.  She is severely limited by psoriatic arthritis and therefore cannot exert herself.  She denies orthopnea, PND or lower extremity edema.  She is wearing an orthotic device on her left ankle.  She denies cough hemoptysis or wheezing.    ECG shows atrial fibrillation with a ventricular rate in the 70s but is otherwise a normal tracing.   Past Medical History:  Diagnosis Date   A-fib [facility])    "just after knee 2nd OR" (11/07/2012)   Anemia    Arthritis    RA , SACROTIC ARTHRITIS   Complication of anesthesia    found to be in afib when she woke up   Dysrhythmia    GERD (gastroesophageal reflux disease)    Head injury, closed, with concussion    History of blood transfusion    "after 1 of my knee ORs and today" (11/07/2012)     Hypertension    Peripheral neuropathy    "from back OR in 2005; in hands and feet" (11/07/2012)   Psoriatic arthritis (HCC)    on Prednisone Rx   Past Surgical History:  Procedure Laterality Date   ANKLE FUSION Right 01/07/2016   Procedure: Right Tibiocalcaneal Fusion;  Surgeon: Nadara Mustard, MD;  Location: [facility] OR;  Service: Orthopedics;  Laterality: Right;   BACK SURGERY     CATARACT EXTRACTION W/ INTRAOCULAR LENS  IMPLANT, BILATERAL Bilateral 2007   CHOLECYSTECTOMY  2000's   COLONOSCOPY N/A 11/10/2012   Procedure: COLONOSCOPY;  Surgeon: Theda Belfast, MD;  Location: [facility] ENDOSCOPY;  Service: Endoscopy;  Laterality: N/A;   ESOPHAGOGASTRODUODENOSCOPY N/A 11/10/2012   Procedure: ESOPHAGOGASTRODUODENOSCOPY (EGD);  Surgeon: Theda Belfast, MD;  Location: [facility] ENDOSCOPY;  Service: Endoscopy;  Laterality: N/A;   EYE SURGERY     JOINT REPLACEMENT     POSTERIOR LUMBAR FUSION  2005   "got a rod and 4 pins in there" (11/07/2012)   REPLACEMENT TOTAL KNEE BILATERAL Bilateral 2007   TRANSTHORACIC ECHOCARDIOGRAM  05/24/2006   TUBAL LIGATION  1974   VAGINAL HYSTERECTOMY  1983     Current Meds  Medication Sig   acetaminophen (TYLENOL) 325 MG tablet Take 2 tablets (650 mg total) by mouth every 4 (four) hours as needed. (Patient taking differently: Take 325 mg by mouth every 6 (six) hours as needed (for pain.). )   Amino  Acids-Protein Hydrolys (FEEDING SUPPLEMENT, PRO-STAT SUGAR FREE 64,) LIQD Take 30 mLs by mouth 2 (two) times daily.   Apremilast (OTEZLA) 30 MG TABS Take 30 mg by mouth 2 (two) times daily.   aspirin EC 81 MG tablet Take 81 mg by mouth every evening.   Biotin 5000 MCG CAPS Take by mouth 2 (two) times a day.   diltiazem (CARDIZEM CD) 120 MG 24 hr capsule TAKE 1 CAPSULE BY MOUTH  DAILY   DULoxetine (CYMBALTA) 30 MG capsule Take 90 mg by mouth daily. Take along with 60 mg capsule=90 mg   DULoxetine (CYMBALTA) 60 MG capsule Take 90 mg by mouth daily. Take along  with 30mg  capsule to make a total of 90mg     Flaxseed, Linseed, (FLAX SEED OIL PO) Take by mouth. Take 1 capsule daily   FORTEO 600 MCG/2.4ML SOLN Inject 20 mcg into the skin daily.    furosemide (LASIX) 40 MG tablet TAKE 2 TABLETS BY MOUTH  DAILY   gabapentin (NEURONTIN) 600 MG tablet Take 600 mg by mouth 2 (two) times daily. 2 tablets in the am one tablet at noon 2 tablets before bed.   HYDROcodone-acetaminophen (NORCO/VICODIN) 5-325 MG tablet Take 1-2 tablets by mouth every 6 (six) hours as needed for moderate pain or severe pain.   hydroxypropyl methylcellulose / hypromellose (ISOPTO TEARS / GONIOVISC) 2.5 % ophthalmic solution Place 1 drop into both eyes 3 (three) times daily as needed for dry eyes.   losartan (COZAAR) 100 MG tablet Take 100 mg by mouth daily.    Magnesium 250 MG TABS Take 1 tablet by mouth every evening.    methocarbamol (ROBAXIN) 500 MG tablet Take 1 tablet (500 mg total) by mouth every 8 (eight) hours as needed for muscle spasms.   metoprolol succinate (TOPROL-XL) 50 MG 24 hr tablet TAKE 1 TABLET BY MOUTH  DAILY WITH OR IMMEDIATELY  FOLLOWING A MEAL   Multiple Vitamin (MULTIVITAMIN WITH MINERALS) TABS Take 1 tablet by mouth daily.   Omega-3 Fatty Acids (FISH OIL) 1200 MG CAPS Take by mouth daily.   omeprazole (PRILOSEC) 20 MG capsule Take 20 mg by mouth every evening.   potassium chloride SA (K-DUR,KLOR-CON) 20 MEQ tablet Take 20 mEq by mouth 2 (two) times daily.    predniSONE (DELTASONE) 5 MG tablet Take 1 tablet (5 mg total) by mouth daily with breakfast. (Patient taking differently: Take 4 mg by mouth daily with breakfast. )   PROAIR HFA 108 (90 BASE) MCG/ACT inhaler Inhale 1-2 puffs into the lungs every 6 (six) hours as needed for wheezing or shortness of breath.    Vitamin D, Ergocalciferol, (DRISDOL) 50000 UNITS CAPS Take 50,000 Units by mouth every Tuesday.    Current Facility-Administered Medications for the 12/13/19 encounter (Office Visit) with  Tiasha Helvie, Rachelle Hora, MD  Medication   ertapenem [facility]) injection 1,000 mg     Allergies:   Penicillins, Prolia [denosumab], Ceftriaxone, Fosamax [alendronate sodium], and Tubersol [tuberculin ppd]   Social History   Tobacco Use   Smoking status: Former Smoker    Packs/day: 0.50    Years: 30.00    Pack years: 15.00    Types: Cigarettes    Quit date: 08/09/1981    Years since quitting: 38.3   Smokeless tobacco: Never Used  Substance Use Topics   Alcohol use: No   Drug use: No     Family Hx: The patient's family history includes Aneurysm (age of onset: 105) in her son; Heart attack in her father; Liver disease in  her mother.  ROS:   Please see the history of present illness.    All other systems are reviewed and are negative.   Prior CV studies:   The following studies were reviewed today:  Echocardiogram April 19, 2017  showed normal left ventricular size and systolic function, EF 50-55%, no serious valvular abnormalities.  The left atrium was at least moderately dilated.  Lower extremity arterial Dopplers performed September 05, 2015 showed bilateral diffuse SFA disease without focal stenosis, two-vessel runoff on the right (occluded peroneal), single-vessel runoff on the left (occluded peroneal and posterior tibial).  ABI was 1.6-1.7 bilaterally.  Labs/Other Tests and Data Reviewed:    EKG:  Ordered today shows atrial fibrillation with controlled ventricular rate and is otherwise normal, QTC 416 ms Recent Labs: No results found for requested labs within last 8760 hours.  10/16/2018 Hemoglobin 10, creatinine 0.8, normal liver function tests Recent Lipid Panel No results found for: CHOL, TRIG, HDL, CHOLHDL, LDLCALC, LDLDIRECT  10/16/2018 Total cholesterol 137, HDL 38, LDL 67, triglycerides 160  Wt Readings from Last 3 Encounters:  12/13/19 183 lb (83 kg)  05/25/18 144 lb (65.3 kg)  12/08/17 148 lb (67.1 kg)     Objective:    Vital Signs:  BP 120/67    Pulse 76     Ht 4\' 11"  (1.499 m)    Wt 183 lb (83 kg)    SpO2 99%    BMI 36.96 kg/m     General: Alert, oriented x3, no distress, moderately obese.  In wheelchair. Head: no evidence of trauma, PERRL, EOMI, no exophtalmos or lid lag, no myxedema, no xanthelasma; normal ears, nose and oropharynx Neck: normal jugular venous pulsations and no hepatojugular reflux; brisk carotid pulses without delay and no carotid bruits Chest: clear to auscultation, no signs of consolidation by percussion or palpation, normal fremitus, symmetrical and full respiratory excursions Cardiovascular: normal position and quality of the apical impulse, regular rhythm, normal first and second heart sounds, no murmurs, rubs or gallops Abdomen: no tenderness or distention, no masses by palpation, no abnormal pulsatility or arterial bruits, normal bowel sounds, no hepatosplenomegaly Extremities: no clubbing, cyanosis or edema; 2+ radial, ulnar and brachial pulses bilaterally; 2+ right femoral, posterior tibial and dorsalis pedis pulses; 2+ left femoral, posterior tibial and dorsalis pedis pulses; no subclavian or femoral bruits Neurological: grossly nonfocal Psych: Normal mood and affect   ASSESSMENT & PLAN:    1. Essential hypertension   2. Permanent atrial fibrillation (HCC)   3. Hypertriglyceridemia   4. Psoriatic arthritis (HCC)      1. AFib: Permanent arrhythmia, asymptomatic, well rate controlled. Due to frequent falls and high bleeding risk (including history of intracranial hemorrhage) she is on aspirin rather than full anticoagulation.  CHA2DS2-VASc 4 (age 45, gender, hypertension). 2. HTN: Very well controlled.  Note history of problems with orthostatic hypotension, recently not that big of an issue. 3. HLP: No recent lipid profile available for review.  Triglycerides were very mildly elevated and HDL was mildly decreased on labs last year. 4. History of psoriatic arthritis: On Otezla and low-dose prednisone.  Major  limitation to her functional ability.  COVID-19 Education: The signs and symptoms of COVID-19 were discussed with the patient and how to seek care for testing (follow up with PCP or arrange E-visit).  The importance of social distancing was discussed today.  Time:   Today, I have spent 16 minutes with the patient with telehealth technology discussing the above problems.  Medication Adjustments/Labs and Tests Ordered: Current medicines are reviewed at length with the patient today.  Concerns regarding medicines are outlined above.   Tests Ordered: Orders Placed This Encounter  Procedures   EKG 12-Lead    Patient Instructions  Medication Instructions:  No changes *If you need a refill on your cardiac medications before your next appointment, please call your pharmacy*   Lab Work: None ordered If you have labs (blood work) drawn today and your tests are completely normal, you will receive your results only by:  MyChart Message (if you have MyChart) OR  A paper copy in the mail If you have any lab test that is abnormal or we need to change your treatment, we will call you to review the results.   Testing/Procedures: None ordered   Follow-Up: At [facility], you and your health needs are our priority.  As part of our continuing mission to provide you with exceptional heart care, we have created designated Provider Care Teams.  These Care Teams include your primary Cardiologist (physician) and Advanced Practice Providers (APPs -  Physician Assistants and Nurse Practitioners) who all work together to provide you with the care you need, when you need it.  We recommend signing up for the patient portal called "MyChart".  Sign up information is provided on this After Visit Summary.  MyChart is used to connect with patients for Virtual Visits (Telemedicine).  Patients are able to view lab/test results, encounter notes, upcoming appointments, etc.  Non-urgent messages can be sent  to your provider as well.   To learn more about what you can do with MyChart, go to [email or web address].    Your next appointment:   12 month(s)  The format for your next appointment:   In Person  Provider:   You may see Thurmon Fair, MD or one of the following Advanced Practice Providers on your designated Care Team:    Azalee Course, PA-C  Micah Flesher, [place] or   Judy Pimple, PA-C      Signed, Thurmon Fair, MD  12/14/2019 3:28 PM    [facility]

## 2020-03-04 IMAGING — MR MR THORACIC SPINE W/O CM
4 of 7 series · 24 of 48 positions shown · non-contrast
Comparison: Chest two-view 04/29/2017, chest CT 04/17/2017. CT
abdomen pelvis 04/29/2017

ADDENDUM:
These results were called by telephone at the time of interpretation
on 04/30/2017 at [DATE] to Dr. Dakarai , who verbally acknowledged
these results.
CLINICAL DATA: Back pain.  Abnormal x-ray.

EXAM:
MRI THORACIC SPINE WITHOUT CONTRAST
TECHNIQUE: Multiplanar, multisequence MR imaging of the thoracic spine was
performed. No intravenous contrast was administered.

[Series 5: T1 · sagittal · 3.0mm · 1.25mm/px · 4 of 14 slices shown (1 of 2)]
[im 1/14]
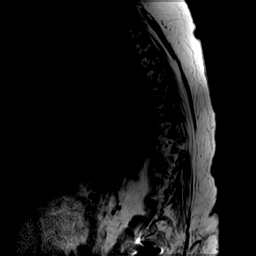
[im 5/14]
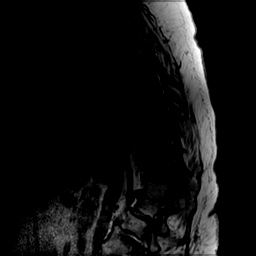
[im 9/14]
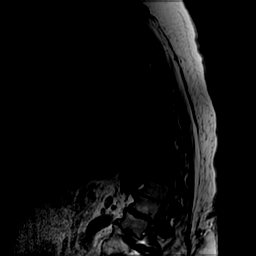
[im 14/14]
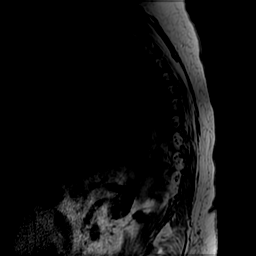

[Series 7: T2 post-contrast · sagittal · 3.0mm · 1.25mm/px · 4 of 14 slices shown]
[im 1/14]
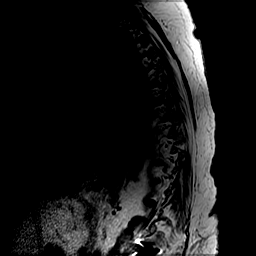
[im 5/14]
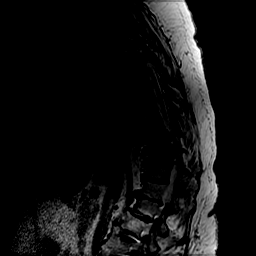
[im 9/14]
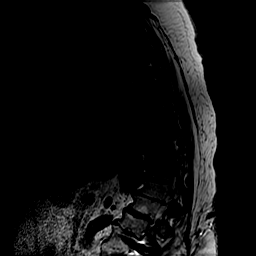
[im 14/14]
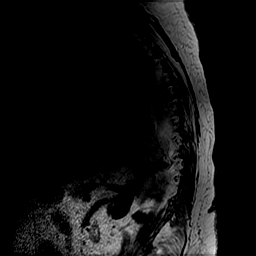

[Series 8: T2 · axial · 4.0mm · 0.39mm/px · z∈[-220,-36]mm · 11 of 38 slices shown]
[im 1/38]
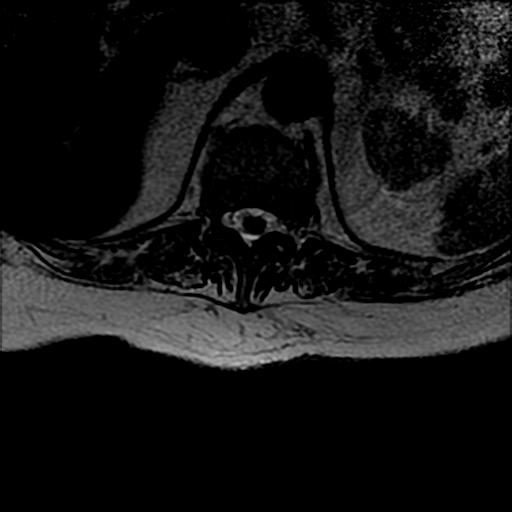
[im 4/38]
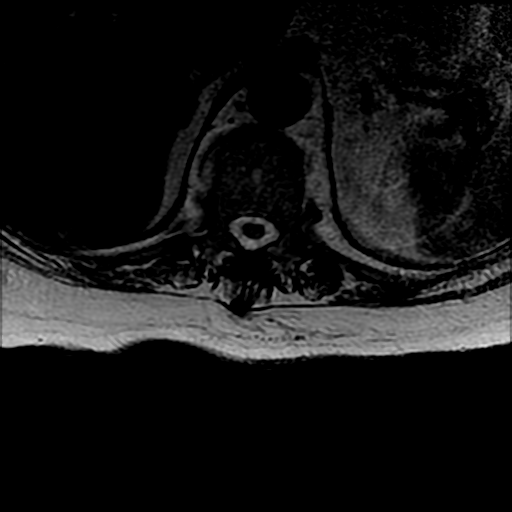
[im 8/38]
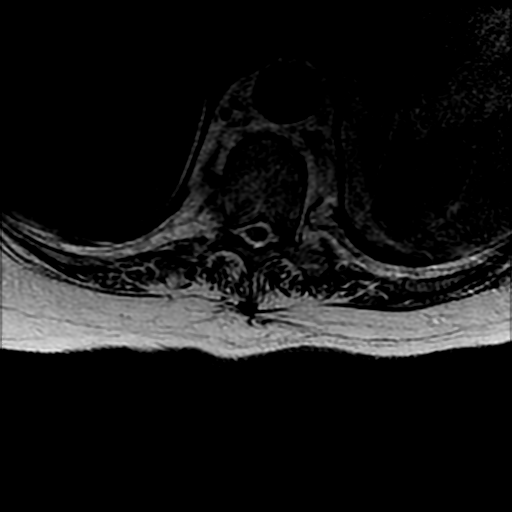
[im 12/38]
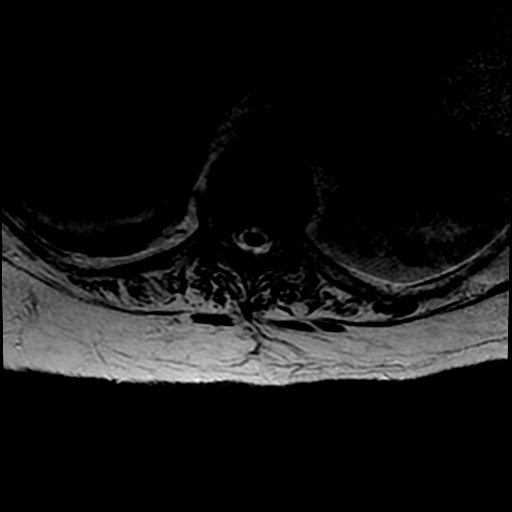
[im 15/38]
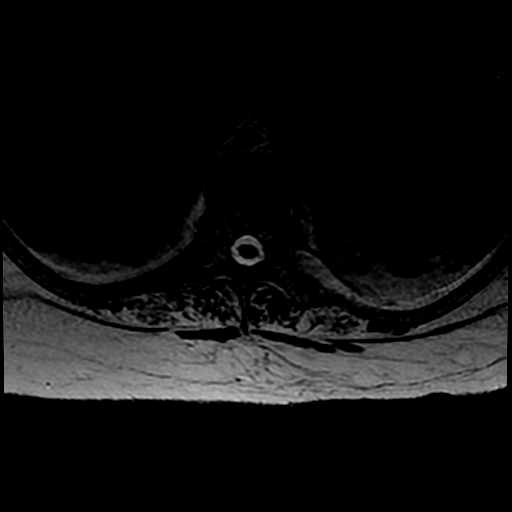
[im 19/38]
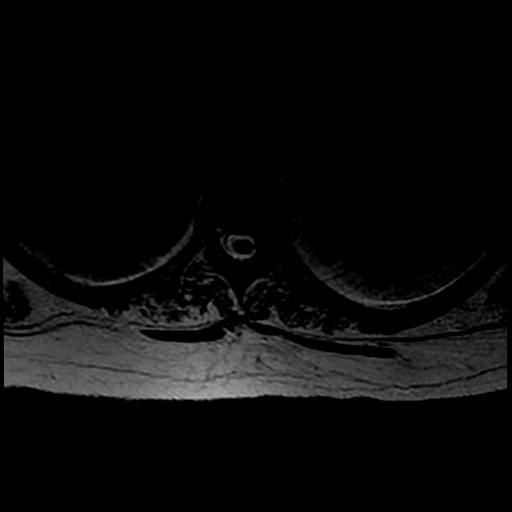
[im 23/38]
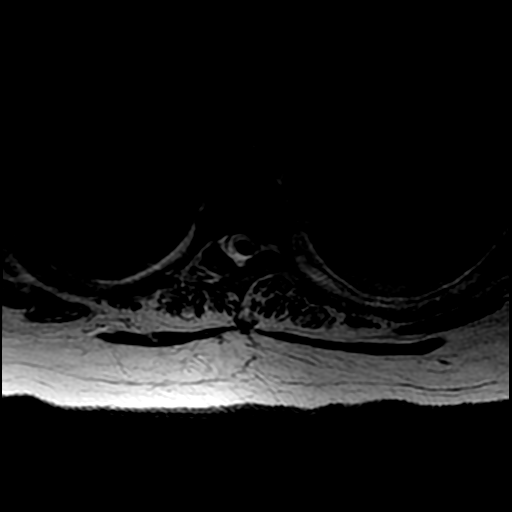
[im 26/38]
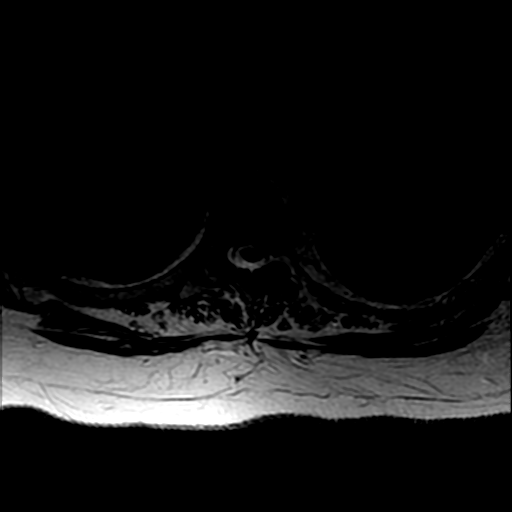
[im 30/38]
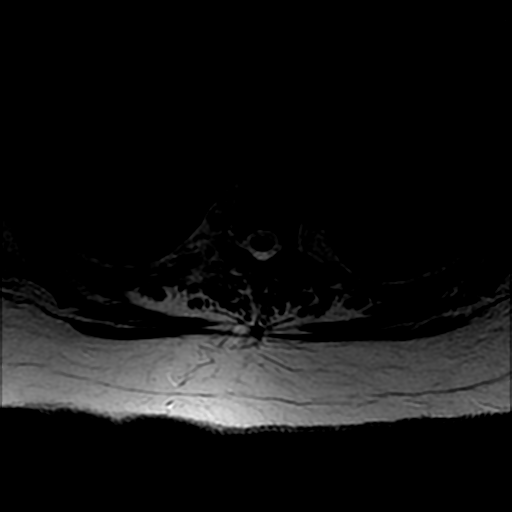
[im 34/38]
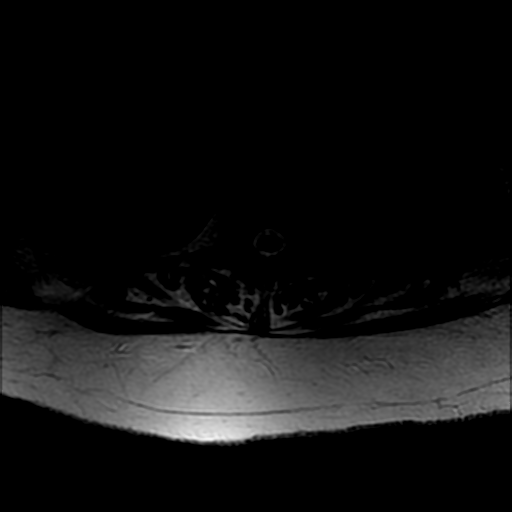
[im 38/38]
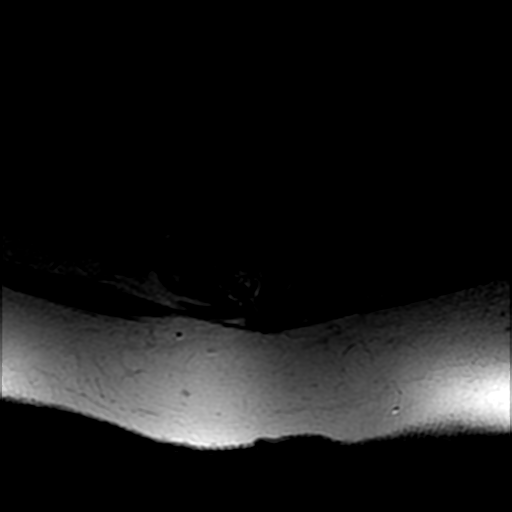

[Series 10: T1 · axial · non-contrast · 4.0mm · 0.39mm/px · z∈[-220,-60]mm · 5 of 38 slices shown (2 of 2)]
[im 1/38]
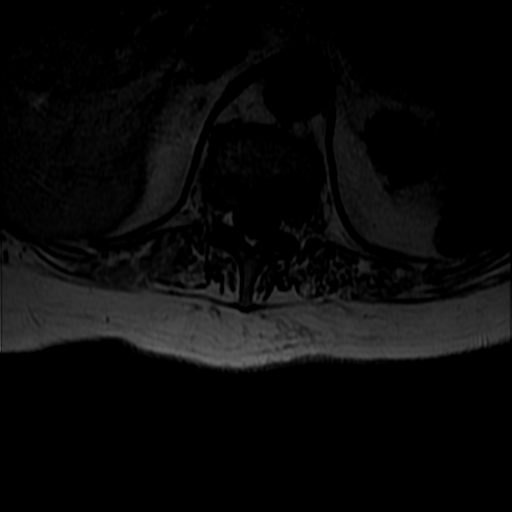
[im 4/38]
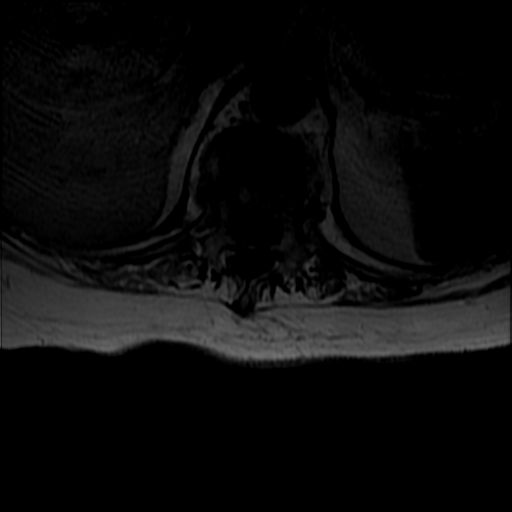
[im 8/38]
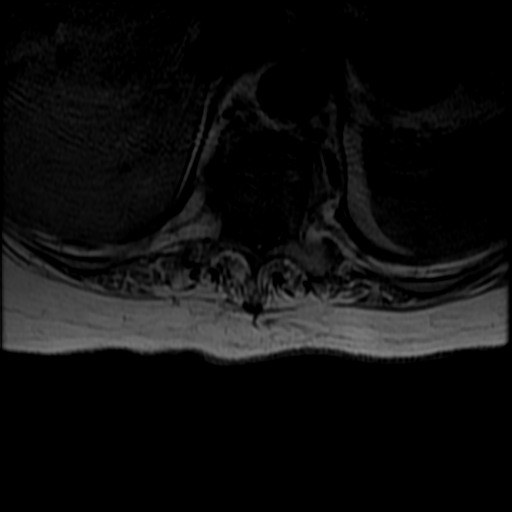
[im 19/38]
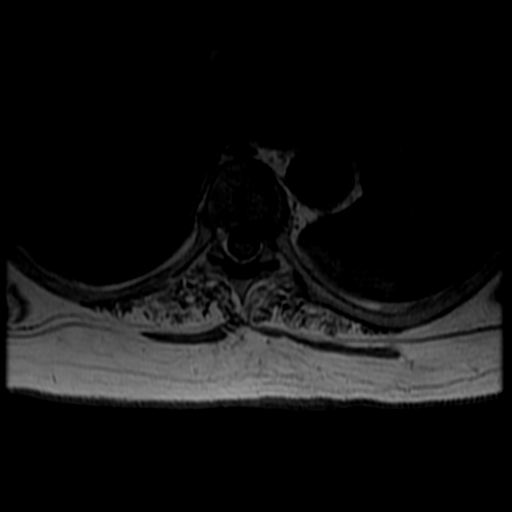
[im 34/38]
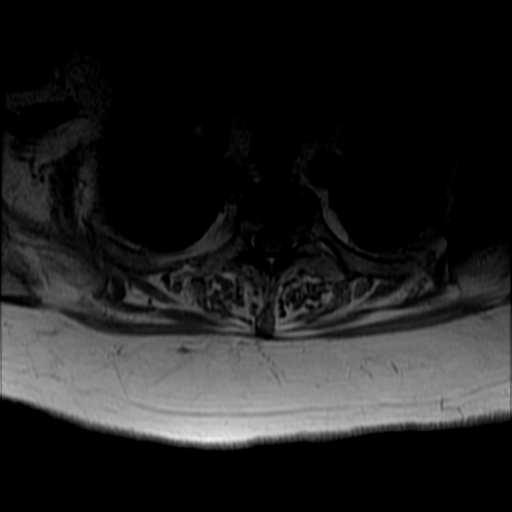

[24 of 48 positions shown; findings below may reference images not displayed]

FINDINGS: Alignment: Anterolisthesis T8-9. Mild retrolisthesis T5-6 and T6-7
and T12-L1.

Vertebrae: Bone marrow edema throughout T10 and T11 centered at the
disc space. Endplate erosions at the disc space with hyperintense
signal in the disc. Bone erosion in the endplates is noted on recent
CT and findings are compatible with discitis and osteomyelitis.

Mild chronic compression fracture of T3. Hemangioma T5 vertebral
body. No evidence of metastatic disease.

Cord:  Negative for cord compression

Paraspinal and other soft tissues: Small bilateral pleural
effusions. Paraspinous soft tissue edema at T10-11 compatible with
acute infection. Small epidural fluid collection in the canal
compatible with abscess.

Disc levels:

Image quality degraded by motion.

Extensive disc degeneration and spondylosis throughout the thoracic
spine. Central and left-sided spurring at T4-5 with mild spinal
stenosis.

Left-sided osteophyte T5-6

Central left-sided spurring T6-7. Disc and facet degeneration T8-9
with mild spinal stenosis

Disc degeneration and spondylosis at T9-10.

Diffuse bone marrow edema and endplate erosive changes at T10-11
compatible with infection. Paraspinous soft tissue edema. Ventral
epidural soft tissue thickening and fluid collection on the right
compatible with small epidural abscess. Cord flattening with
moderate spinal stenosis.

Mild degenerative change at T11-T12
IMPRESSION: Image quality degraded by motion.

Extensive multilevel degenerative change throughout the thoracic
spine

Findings consistent with discitis and osteomyelitis at T10-11.
Paraspinous soft tissue edema. Epidural soft tissue thickening and
small abscess on the right contributing to moderate spinal stenosis.

## 2020-03-18 ENCOUNTER — Other Ambulatory Visit: Payer: Self-pay | Admitting: Cardiovascular Disease

## 2020-04-22 ENCOUNTER — Other Ambulatory Visit: Payer: Self-pay | Admitting: Cardiovascular Disease

## 2020-05-14 ENCOUNTER — Ambulatory Visit (INDEPENDENT_AMBULATORY_CARE_PROVIDER_SITE_OTHER): Payer: Medicare Other | Admitting: Podiatrist

## 2020-05-14 ENCOUNTER — Other Ambulatory Visit: Payer: Self-pay

## 2020-05-14 ENCOUNTER — Encounter: Payer: Self-pay | Admitting: Podiatrist

## 2020-05-14 DIAGNOSIS — M21071 Valgus deformity, not elsewhere classified, right ankle: Secondary | ICD-10-CM

## 2020-05-14 DIAGNOSIS — B351 Tinea unguium: Secondary | ICD-10-CM | POA: Diagnosis not present

## 2020-05-14 DIAGNOSIS — G629 Polyneuropathy, unspecified: Secondary | ICD-10-CM

## 2020-05-14 NOTE — Progress Notes (Signed)
Chief Complaint  Patient presents with  . Nail Problem    Thick painful toenails     HPI: Patient is 80 y.o. female who presents today for care of her toenails.  She presents in a wheelchair with her daughter and her caregiver with her at todays visit.  She has multiple medical issues and has been seen by wound care for several wounds on her legs and feet.  She has a heel wound that has recently healed on the right.  She has seen vascular for circulation issues and had vascular surgery in the past for LE circulation.  She also relates the right foot turns outward and she wonders if she may be able to have a brace made to help with this.  She has a history of neuropathy and relates she has very little sensation to her feet.   Patient Active Problem List   Diagnosis Date Noted  . Permanent atrial fibrillation (HCC) 05/17/2019  . Dyslipidemia (high LDL; low HDL) 05/17/2019  . S/P PICC central line placement 01/24/2018  . Diarrhea 12/14/2017  . Left buttock abscess 12/06/2017  . Discitis of thoracic region 05/12/2017  . Medication monitoring encounter 05/12/2017  . Hx of adverse drug reaction   . History of blood transfusion   . GERD (gastroesophageal reflux disease)   . Dysrhythmia   . Complication of anesthesia   . Arthritis   . Anemia   . Displaced spiral fracture of shaft of right tibia, subsequent encounter for closed fracture with delayed healing 11/11/2016  . 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 Osteomyelitis of pelvis (HCC)   . SAH (subarachnoid hemorrhage) (HCC) 09/14/2014  . Essential hypertension 09/14/2014  . Obesity (BMI 30-39.9)- suspected sleep apnea, declines sleep study 11/10/2012  . Psoriatic arthritis (HCC) 11/06/2012  . Iron deficiency anemia- transfused this admission 11/06/2012  . Depression 11/06/2012  . Peripheral neuropathy 11/06/2012  . Adrenal insufficiency (HCC) 08/09/2012  . PAF (paroxysmal atrial fibrillation) (HCC) 08/09/2012    Current  Outpatient Medications on File Prior to Visit  Medication Sig Dispense Refill  . capsaicin (ZOSTRIX) 0.025 % cream apply to painful areas at least twice daily    . clotrimazole (LOTRIMIN AF) 1 % cream Apply to affected area BID    . Coenzyme Q10 (COQ10) 100 MG CAPS daily.    Marland Kitchen diltiazem (DILACOR XR) 120 MG 24 hr capsule Take 1 capsule by mouth at bedtime.    . Lido-Capsaicin-Men-Methyl Sal (LIDOPRO) 4-.323-11-21.5 % OINT apply to legs at bedtime to reduce pain    . lidocaine (XYLOCAINE) 5 % ointment 1 application as needed    . Lidocaine HCl (ASPERCREME W/LIDOCAINE) 4 % CREA Apply to painful feet at bedtime, as needed    . Multiple Vitamin (MV-ONE) CAPS daily.    . mupirocin cream (BACTROBAN) 2 % apply to affected area BID    . nystatin cream (MYCOSTATIN) apply a small amount to affected area daily    . sodium hypochlorite (DAKIN'S 1/2 STRENGTH) external solution Apply to left buttocks wound with packing daily    . acetaminophen (TYLENOL) 325 MG tablet Take 2 tablets (650 mg total) by mouth every 4 (four) hours as needed. (Patient taking differently: Take 325 mg by mouth every 6 (six) hours as needed (for pain.). )    . Amino Acids-Protein Hydrolys (FEEDING SUPPLEMENT, PRO-STAT SUGAR FREE 64,) LIQD Take 30 mLs by mouth 2 (two) times daily. 900 mL 0  . Apremilast (OTEZLA) 30 MG TABS Take 30 mg by mouth  2 (two) times daily.    Marland Kitchen aspirin EC 81 MG tablet Take 81 mg by mouth every evening.    . Biotin 5000 MCG CAPS Take by mouth 2 (two) times a day.    Marland Kitchen CARTIA XT 120 MG 24 hr capsule TAKE 1 CAPSULE BY MOUTH  DAILY 90 capsule 3  . diclofenac Sodium (VOLTAREN) 1 % GEL SMARTSIG:Gram(s) Topical Twice Daily PRN    . DULoxetine (CYMBALTA) 30 MG capsule Take 90 mg by mouth daily. Take along with 60 mg capsule=90 mg    . DULoxetine (CYMBALTA) 60 MG capsule Take 90 mg by mouth daily. Take along with 30mg  capsule to make a total of 90mg      . Flaxseed, Linseed, (FLAX SEED OIL PO) Take by mouth. Take 1  capsule daily    . fluconazole (DIFLUCAN) 150 MG tablet Take 150 mg by mouth daily.    FORTEO 600 MCG/2.4ML SOLN Inject 20 mcg into the skin daily.     . furosemide (LASIX) 40 MG tablet TAKE 2 TABLETS BY MOUTH  DAILY 180 tablet 3  . gabapentin (NEURONTIN) 600 MG tablet Take 600 mg by mouth 2 (two) times daily. 2 tablets in the am one tablet at noon 2 tablets before bed.    HYDROcodone-acetaminophen (NORCO/VICODIN) 5-325 MG tablet Take 1-2 tablets by mouth every 6 (six) hours as needed for moderate pain or severe pain. 15 tablet 0  . hydroxypropyl methylcellulose / hypromellose (ISOPTO TEARS / GONIOVISC) 2.5 % ophthalmic solution Place 1 drop into both eyes 3 (three) times daily as needed for dry eyes.    Marland Kitchen losartan (COZAAR) 100 MG tablet Take 100 mg by mouth daily.     . Magnesium 250 MG TABS Take 1 tablet by mouth every evening.     . methocarbamol (ROBAXIN) 500 MG tablet Take 1 tablet (500 mg total) by mouth every 8 (eight) hours as needed for muscle spasms. 30 tablet 0  . metoprolol succinate (TOPROL-XL) 50 MG 24 hr tablet TAKE 1 TABLET BY MOUTH  DAILY WITH OR IMMEDIATELY  FOLLOWING A MEAL 90 tablet 3  . Multiple Vitamin (MULTIVITAMIN WITH MINERALS) TABS Take 1 tablet by mouth daily.    . Omega-3 Fatty Acids (FISH OIL) 1200 MG CAPS Take by mouth daily.    Marland Kitchen omeprazole (PRILOSEC) 20 MG capsule Take 20 mg by mouth every evening.    . potassium chloride SA (K-DUR,KLOR-CON) 20 MEQ tablet Take 20 mEq by mouth 2 (two) times daily.     . predniSONE (DELTASONE) 5 MG tablet Take 1 tablet (5 mg total) by mouth daily with breakfast. (Patient taking differently: Take 4 mg by mouth daily with breakfast. )    . PROAIR HFA 108 (90 BASE) MCG/ACT inhaler Inhale 1-2 puffs into the lungs every 6 (six) hours as needed for wheezing or shortness of breath.     . senna-docusate (SENOKOT-S) 8.6-50 MG tablet Take by mouth.    . sulfamethoxazole-trimethoprim (BACTRIM DS) 800-160 MG tablet Take 1 tablet by mouth 2  (two) times daily.    . Vitamin D, Ergocalciferol, (DRISDOL) 50000 UNITS CAPS Take 50,000 Units by mouth every Tuesday.      Current Facility-Administered Medications on File Prior to Visit  Medication Dose Route Frequency Provider Last Rate Last Admin  . ertapenem (INVANZ) injection 1,000 mg  1 g Intravenous Q24H 05-03-1978, MD        Allergies  Allergen Reactions  . Penicillins Anaphylaxis    THROAT SWELLING  Has patient had a PCN reaction causing immediate rash, facial/tongue/throat swelling, SOB or lightheadedness with hypotension:  # # YES # #  Has patient had a PCN reaction causing severe rash involving mucus membranes or skin necrosis: # # # YES # # # Has patient had a PCN reaction that required hospitalization:No Has patient had a PCN reaction occurring within the last 10 years:  # # YES # #  If all of the above answers are "NO", then may proceed with Cephalosporin use.   Jake Seats [Denosumab] Other (See Comments)    Severe Pain in the groin area.  . Ceftriaxone     Possible cause of neutropenia  . Ciprofloxacin Hcl Other (See Comments)  . Fosamax [Alendronate Sodium]     UNSPECIFIED REACTION   . Tubersol [Tuberculin Ppd] Other (See Comments)    Unknown     Review of Systems No fevers, chills, nausea, muscle aches, no difficulty breathing, no calf pain, no chest pain or shortness of breath.   Physical Exam  GENERAL APPEARANCE: Alert, conversant. Appropriately groomed. No acute distress.   VASCULAR: Pedal pulses NON palpable DP and PT bilateral.  Capillary refill time is greater than 3 seconds to all digits,  Proximal to distal cooling it warm to warm.  Digital hair growth absent. Toes are pink.  NEUROLOGIC: sensation is diminshed to 5.07 monofilament at 0/5 sites bilateral.  Light touch is intact bilateral, vibratory sensation decreased bilateral  MUSCULOSKELETAL: decreased muscle strength, tone and stability bilateral.  Decrease in foot range of motion bilateral  with the right foot abducting laterally at the ankle and leg and metatarsus adductus noted.   Contracture of digits is noted bilateral.  Prominent cuboid noted plantar left foot consistent with charcot like changes.    DERMATOLOGIC: skin is warm, supple, and dry.  Right heel has a healed ulcer with hyperkeratotic skin present.  Appears stable and is left intact.  Right lower leg has a dressing in place that is also left intact.  Right hallux has a healed ulcer on the medial aspect as well.  Left foot has multiple superficial excoriations present.  None appear to be draining, or infected.  She has been seen at the wound center for all of these wounds.   Digital nails are thick, discolored, dystrophic, brittle with subungual debris present and clinically mycotic x 10.                Assessment    ICD-10-CM   1. Onychomycosis of toenail  B35.1   2. 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 Neuropathy  G62.9      Plan Exam and findings discussed with the patient.  I debrided the toenails in length and thickness with nail nippers and a power burr with no incident.  I also recommended she continue to treat the skin tears with the antibiotic cream she was using from the wound center.  Discussed if she sees any redness, swelling or drainage from the foot, or toes she should call immediately to be seen.  She also asked about a brace for her right foot to stop it from rolling laterally.  I dispensed a prescription for a brace to be obtained by [facility].  She will see them to see if they might have a suggestion for a soft, accomodative brace that would be beneficial for her.   She will return to our office for continued care and if any concerns arise in the meantime she will call.

## 2020-06-20 ENCOUNTER — Other Ambulatory Visit: Payer: Self-pay | Admitting: Cardiovascular Disease

## 2020-08-15 ENCOUNTER — Ambulatory Visit (INDEPENDENT_AMBULATORY_CARE_PROVIDER_SITE_OTHER): Payer: Medicare Other | Admitting: Podiatry

## 2020-08-15 ENCOUNTER — Other Ambulatory Visit: Payer: Self-pay

## 2020-08-15 DIAGNOSIS — G629 Polyneuropathy, unspecified: Secondary | ICD-10-CM

## 2020-08-15 DIAGNOSIS — M1991 Primary osteoarthritis, unspecified site: Secondary | ICD-10-CM | POA: Insufficient documentation

## 2020-08-15 DIAGNOSIS — B351 Tinea unguium: Secondary | ICD-10-CM

## 2020-08-15 DIAGNOSIS — M21961 Unspecified acquired deformity of right lower leg: Secondary | ICD-10-CM

## 2020-08-15 DIAGNOSIS — R5383 Other fatigue: Secondary | ICD-10-CM | POA: Insufficient documentation

## 2020-08-15 DIAGNOSIS — M2550 Pain in unspecified joint: Secondary | ICD-10-CM | POA: Insufficient documentation

## 2020-08-15 DIAGNOSIS — L84 Corns and callosities: Secondary | ICD-10-CM

## 2020-08-15 DIAGNOSIS — M5136 Other intervertebral disc degeneration, lumbar region: Secondary | ICD-10-CM | POA: Insufficient documentation

## 2020-08-16 ENCOUNTER — Encounter: Payer: Self-pay | Admitting: Podiatry

## 2020-08-16 NOTE — Progress Notes (Signed)
Subjective:  Patient ID: Andrea Yu, female    DOB: 03/22/1940,  MRN: [id number]  80 y.o. female presents with at risk foot care with history of peripheral neuropathy and thick, elongated toenails 1-5 bilaterally which are tender when wearing enclosed shoe gear.  She has h/o right ankle fusion complicated by osteomyelitis requiring removal of hardware.   She was seen by Dr. Irving Yu on last visit and Dr. Irving Yu wrote a prescription for a brace to be provided by [facility] for her RLE. Patient states she was unable to tolerate the brace. She has h/o stage 3 pressure ulcer of right heel which has healed with [facility]. She would like to know what other options are available to stabilize her RLE. She does have soft neoprene ankle brace which she is using now. She uses a wheelchair mostly for mobility.  Ms. Andrea Yu states her skin tears have healed by using antibiotic ointment as instructed by Dr. Irving Yu.  PCP: Andrea Matin, MD and last visit was: 3 months ago.  Review of Systems: Negative except as noted in the HPI.   Allergies  Allergen Reactions   Penicillins Anaphylaxis    THROAT SWELLING  Has patient had a PCN reaction causing immediate rash, facial/tongue/throat swelling, SOB or lightheadedness with hypotension:  # # YES # #  Has patient had a PCN reaction causing severe rash involving mucus membranes or skin necrosis: # # # YES # # # Has patient had a PCN reaction that required hospitalization:No Has patient had a PCN reaction occurring within the last 10 years:  # # YES # #  If all of the above answers are "NO", then may proceed with Cephalosporin use.    Prolia [Denosumab] Other (See Comments)    Severe Pain in the groin area.   Ceftriaxone     Possible cause of neutropenia   Ciprofloxacin Hcl Other (See Comments)   Fosamax [Alendronate Sodium]     UNSPECIFIED REACTION    Tubersol [Tuberculin Ppd] Other (See Comments)    Unknown      Objective:  There were no vitals filed for this visit. Constitutional Patient is a pleasant 80 y.o. Caucasian female morbidly obese in NAD. AAO x 3.  Vascular Capillary refill time to digits <4 seconds b/l lower extremities. Nonpalpable pedal pulse(s) b/l lower extremities. Pedal hair absent. Lower extremity skin temperature gradient within normal limits. No pain with calf compression b/l. Trace edema noted b/l lower extremities. No cyanosis or clubbing noted.  Neurologic Normal speech. Pt has subjective symptoms of neuropathy. Protective sensation diminished with 10g monofilament b/l. Vibratory sensation diminished b/l.  Dermatologic No open wounds b/l lower extremities. No interdigital macerations b/l lower extremities. Hyperkeratotic lesion(s) plantar aspect of heel right foot.  No erythema, no edema, no drainage, no fluctuance. Multiple scabs noted anterior shin LLE. Bilateral 5th digits with mild hyperkeratosis. No erythema, no edema, no drainage, no fluctuance. Heel hyperkeratosis plantarly right foot. No erythema, no edema, no drainage, no fluctuance. Stable blood blisters, dried, present on left hallux, left 3rd toe and plantar right 4th toe.  Orthopedic: Muscle strength 5/5 to all LE muscle groups of left lower extremity. Flail right ankle joint with abduction at ankle joiint. No pain crepitus or joint limitation noted with ROM b/l. Charcot deformity noted left foot. Utilizes wheelchair for mobility assistance.            Right heel post gentle filing with sterile dremel tool.    Assessment:   1.  Onychomycosis of toenail   2. Corns and callosities   3. Ankle deformity, right   4. Neuropathy    Plan:  -Patient was evaluated and treated and all questions answered. -Examined patient. -Patient to continue soft, supportive shoe gear daily. -Toenails 1-5 b/l were gently filed in length and girth with sterile dremel tool without iatrogenic bleeding.  -Hyperkeratotic lesions  gently filed with sterile dremel tool without incident. -She will be referred to Dr. Ventura Yu for evaluation of right ankle deformity requiring bracing. She could not tolerate brace from [facility] and did not take the device. -Patient to report any pedal injuries to medical professional immediately. -Patient/POA to call should there be question/concern in the interim.  Return in about 3 months (around 11/15/2020).  Andrea Yu, DPM

## 2020-08-29 ENCOUNTER — Ambulatory Visit (INDEPENDENT_AMBULATORY_CARE_PROVIDER_SITE_OTHER): Payer: Medicare Other | Admitting: Podiatry

## 2020-08-29 ENCOUNTER — Other Ambulatory Visit: Payer: Self-pay

## 2020-08-29 DIAGNOSIS — M21071 Valgus deformity, not elsewhere classified, right ankle: Secondary | ICD-10-CM

## 2020-08-29 DIAGNOSIS — M21961 Unspecified acquired deformity of right lower leg: Secondary | ICD-10-CM | POA: Diagnosis not present

## 2020-08-29 DIAGNOSIS — M960 Pseudarthrosis after fusion or arthrodesis: Secondary | ICD-10-CM | POA: Diagnosis not present

## 2020-08-29 NOTE — Progress Notes (Signed)
  Subjective:  Patient ID: Andrea Yu, female    DOB: 1940-04-11,  MRN: [id number]  Chief Complaint  Patient presents with   Ankle Injury    suggestions related to flail ankle joint right foot.   80 y.o. female presents with the above complaint. History confirmed with patient.  Patient reports difficulty walking and pain to her foot and her leg.  She has failed other bracing options and would like to discuss what options she could consider to help with her pain and potentially allow her to walk.  Objective:  Physical Exam: warm, good capillary refill, no trophic changes or ulcerative lesions, normal DP and PT pulses, and reduced sensation 2/2 neuropathy.  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  She has pain at the anterior crest of the tibia likely from previous tibial shaft fracture 1. Ankle deformity, right   2. Abduction deformity of foot, right   3. Nonunion after arthrodesis      Plan:  Patient was evaluated and treated and all questions answered.  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  This is suggested on exam today that has motion and crepitus at the ankle.  We discussed that revision of this would be very difficult and we mutually agreed to not consider this.  However I do think that she has a good chance of walking with appropriate brace.  We discussed considering a double upright brace which I wrote a prescription for for her to bring to [facility].  Follow-up in 2 months for follow-up to consider whether this option is working for her.  She will likely need physical therapy at that time to assist with walking.  We discussed that I think she has a good chance of reaching her goal of short ambulation including ambulation around her home.  No follow-ups on file.

## 2020-10-29 ENCOUNTER — Emergency Department (HOSPITAL_COMMUNITY): Payer: Medicare Other

## 2020-10-29 ENCOUNTER — Other Ambulatory Visit: Payer: Self-pay

## 2020-10-29 ENCOUNTER — Inpatient Hospital Stay (HOSPITAL_COMMUNITY)
Admission: EM | Admit: 2020-10-29 | Discharge: 2020-11-14 | DRG: 689 | Disposition: A | Discharge status: Home Health | Payer: Medicare Other | Attending: Internal Medicine | Admitting: Internal Medicine

## 2020-10-29 ENCOUNTER — Encounter (HOSPITAL_COMMUNITY): Payer: Self-pay

## 2020-10-29 DIAGNOSIS — Q2112 Patent foramen ovale: Secondary | ICD-10-CM | POA: Diagnosis not present

## 2020-10-29 DIAGNOSIS — R509 Fever, unspecified: Secondary | ICD-10-CM | POA: Diagnosis present

## 2020-10-29 DIAGNOSIS — Z9049 Acquired absence of other specified parts of digestive tract: Secondary | ICD-10-CM

## 2020-10-29 DIAGNOSIS — Z515 Encounter for palliative care: Secondary | ICD-10-CM | POA: Diagnosis not present

## 2020-10-29 DIAGNOSIS — M1990 Unspecified osteoarthritis, unspecified site: Secondary | ICD-10-CM | POA: Diagnosis present

## 2020-10-29 DIAGNOSIS — F05 Delirium due to known physiological condition: Secondary | ICD-10-CM | POA: Diagnosis not present

## 2020-10-29 DIAGNOSIS — E876 Hypokalemia: Secondary | ICD-10-CM | POA: Diagnosis present

## 2020-10-29 DIAGNOSIS — I480 Paroxysmal atrial fibrillation: Secondary | ICD-10-CM | POA: Diagnosis not present

## 2020-10-29 DIAGNOSIS — Z6838 Body mass index (BMI) 38.0-38.9, adult: Secondary | ICD-10-CM | POA: Diagnosis not present

## 2020-10-29 DIAGNOSIS — D509 Iron deficiency anemia, unspecified: Secondary | ICD-10-CM | POA: Diagnosis present

## 2020-10-29 DIAGNOSIS — E861 Hypovolemia: Secondary | ICD-10-CM | POA: Diagnosis present

## 2020-10-29 DIAGNOSIS — D638 Anemia in other chronic diseases classified elsewhere: Secondary | ICD-10-CM | POA: Diagnosis present

## 2020-10-29 DIAGNOSIS — R6520 Severe sepsis without septic shock: Secondary | ICD-10-CM

## 2020-10-29 DIAGNOSIS — R7881 Bacteremia: Secondary | ICD-10-CM

## 2020-10-29 DIAGNOSIS — Z79899 Other long term (current) drug therapy: Secondary | ICD-10-CM

## 2020-10-29 DIAGNOSIS — E872 Acidosis, unspecified: Secondary | ICD-10-CM | POA: Diagnosis not present

## 2020-10-29 DIAGNOSIS — Z66 Do not resuscitate: Secondary | ICD-10-CM | POA: Diagnosis not present

## 2020-10-29 DIAGNOSIS — E669 Obesity, unspecified: Secondary | ICD-10-CM | POA: Diagnosis present

## 2020-10-29 DIAGNOSIS — I110 Hypertensive heart disease with heart failure: Secondary | ICD-10-CM | POA: Diagnosis not present

## 2020-10-29 DIAGNOSIS — E2740 Unspecified adrenocortical insufficiency: Secondary | ICD-10-CM | POA: Diagnosis present

## 2020-10-29 DIAGNOSIS — I10 Essential (primary) hypertension: Secondary | ICD-10-CM | POA: Diagnosis not present

## 2020-10-29 DIAGNOSIS — Z7952 Long term (current) use of systemic steroids: Secondary | ICD-10-CM

## 2020-10-29 DIAGNOSIS — Z9181 History of falling: Secondary | ICD-10-CM

## 2020-10-29 DIAGNOSIS — I959 Hypotension, unspecified: Secondary | ICD-10-CM | POA: Diagnosis present

## 2020-10-29 DIAGNOSIS — Z8249 Family history of ischemic heart disease and other diseases of the circulatory system: Secondary | ICD-10-CM

## 2020-10-29 DIAGNOSIS — N12 Tubulo-interstitial nephritis, not specified as acute or chronic: Secondary | ICD-10-CM

## 2020-10-29 DIAGNOSIS — G9341 Metabolic encephalopathy: Secondary | ICD-10-CM | POA: Diagnosis present

## 2020-10-29 DIAGNOSIS — I5032 Chronic diastolic (congestive) heart failure: Secondary | ICD-10-CM | POA: Diagnosis present

## 2020-10-29 DIAGNOSIS — I4891 Unspecified atrial fibrillation: Secondary | ICD-10-CM

## 2020-10-29 DIAGNOSIS — Z96653 Presence of artificial knee joint, bilateral: Secondary | ICD-10-CM | POA: Diagnosis present

## 2020-10-29 DIAGNOSIS — R638 Other symptoms and signs concerning food and fluid intake: Secondary | ICD-10-CM | POA: Diagnosis not present

## 2020-10-29 DIAGNOSIS — Z880 Allergy status to penicillin: Secondary | ICD-10-CM

## 2020-10-29 DIAGNOSIS — E860 Dehydration: Secondary | ICD-10-CM | POA: Diagnosis present

## 2020-10-29 DIAGNOSIS — I4821 Permanent atrial fibrillation: Secondary | ICD-10-CM | POA: Diagnosis present

## 2020-10-29 DIAGNOSIS — N390 Urinary tract infection, site not specified: Principal | ICD-10-CM | POA: Diagnosis present

## 2020-10-29 DIAGNOSIS — Z881 Allergy status to other antibiotic agents status: Secondary | ICD-10-CM

## 2020-10-29 DIAGNOSIS — F32A Depression, unspecified: Secondary | ICD-10-CM | POA: Diagnosis present

## 2020-10-29 DIAGNOSIS — Z23 Encounter for immunization: Secondary | ICD-10-CM | POA: Diagnosis not present

## 2020-10-29 DIAGNOSIS — Z7189 Other specified counseling: Secondary | ICD-10-CM | POA: Diagnosis not present

## 2020-10-29 DIAGNOSIS — I609 Nontraumatic subarachnoid hemorrhage, unspecified: Secondary | ICD-10-CM

## 2020-10-29 DIAGNOSIS — I272 Pulmonary hypertension, unspecified: Secondary | ICD-10-CM | POA: Diagnosis present

## 2020-10-29 DIAGNOSIS — R54 Age-related physical debility: Secondary | ICD-10-CM | POA: Diagnosis present

## 2020-10-29 DIAGNOSIS — Z87820 Personal history of traumatic brain injury: Secondary | ICD-10-CM

## 2020-10-29 DIAGNOSIS — E871 Hypo-osmolality and hyponatremia: Secondary | ICD-10-CM | POA: Diagnosis not present

## 2020-10-29 DIAGNOSIS — Z888 Allergy status to other drugs, medicaments and biological substances status: Secondary | ICD-10-CM

## 2020-10-29 DIAGNOSIS — L0231 Cutaneous abscess of buttock: Secondary | ICD-10-CM | POA: Diagnosis present

## 2020-10-29 DIAGNOSIS — R296 Repeated falls: Secondary | ICD-10-CM | POA: Diagnosis present

## 2020-10-29 DIAGNOSIS — Z90710 Acquired absence of both cervix and uterus: Secondary | ICD-10-CM

## 2020-10-29 DIAGNOSIS — Z7982 Long term (current) use of aspirin: Secondary | ICD-10-CM

## 2020-10-29 DIAGNOSIS — Z87891 Personal history of nicotine dependence: Secondary | ICD-10-CM

## 2020-10-29 DIAGNOSIS — F419 Anxiety disorder, unspecified: Secondary | ICD-10-CM | POA: Diagnosis present

## 2020-10-29 DIAGNOSIS — I4811 Longstanding persistent atrial fibrillation: Secondary | ICD-10-CM

## 2020-10-29 DIAGNOSIS — A419 Sepsis, unspecified organism: Secondary | ICD-10-CM | POA: Diagnosis not present

## 2020-10-29 DIAGNOSIS — I2510 Atherosclerotic heart disease of native coronary artery without angina pectoris: Secondary | ICD-10-CM | POA: Diagnosis present

## 2020-10-29 DIAGNOSIS — L4050 Arthropathic psoriasis, unspecified: Secondary | ICD-10-CM | POA: Diagnosis present

## 2020-10-29 DIAGNOSIS — Z20822 Contact with and (suspected) exposure to covid-19: Secondary | ICD-10-CM | POA: Diagnosis not present

## 2020-10-29 DIAGNOSIS — E785 Hyperlipidemia, unspecified: Secondary | ICD-10-CM | POA: Diagnosis present

## 2020-10-29 DIAGNOSIS — Z981 Arthrodesis status: Secondary | ICD-10-CM

## 2020-10-29 DIAGNOSIS — K219 Gastro-esophageal reflux disease without esophagitis: Secondary | ICD-10-CM | POA: Diagnosis present

## 2020-10-29 DIAGNOSIS — G629 Polyneuropathy, unspecified: Secondary | ICD-10-CM

## 2020-10-29 DIAGNOSIS — B955 Unspecified streptococcus as the cause of diseases classified elsewhere: Secondary | ICD-10-CM | POA: Diagnosis not present

## 2020-10-29 LAB — RESP PANEL BY RT-PCR (FLU A&B, COVID) ARPGX2
Influenza A by PCR: NEGATIVE
Influenza B by PCR: NEGATIVE
SARS Coronavirus 2 by RT PCR: NEGATIVE

## 2020-10-29 LAB — COMPREHENSIVE METABOLIC PANEL
ALT: 19 U/L (ref 0–44)
AST: 22 U/L (ref 15–41)
Albumin: 2.9 g/dL — ABNORMAL LOW (ref 3.5–5.0)
Alkaline Phosphatase: 76 U/L (ref 38–126)
Anion gap: 12 (ref 5–15)
BUN: 24 mg/dL — ABNORMAL HIGH (ref 8–23)
CO2: 18 mmol/L — ABNORMAL LOW (ref 22–32)
Calcium: 9 mg/dL (ref 8.9–10.3)
Chloride: 103 mmol/L (ref 98–111)
Creatinine, Ser: 1.22 mg/dL — ABNORMAL HIGH (ref 0.44–1.00)
GFR, Estimated: 45 mL/min — ABNORMAL LOW (ref >60)
Glucose, Bld: 105 mg/dL — ABNORMAL HIGH (ref 70–99)
Potassium: 5.1 mmol/L (ref 3.5–5.1)
Sodium: 133 mmol/L — ABNORMAL LOW (ref 135–145)
Total Bilirubin: 0.9 mg/dL (ref 0.3–1.2)
Total Protein: 6.7 g/dL (ref 6.5–8.1)

## 2020-10-29 LAB — CBC
HCT: 31.9 % — ABNORMAL LOW (ref 36.0–46.0)
Hemoglobin: 9.9 g/dL — ABNORMAL LOW (ref 12.0–15.0)
MCH: 27.7 pg (ref 26.0–34.0)
MCHC: 31 g/dL (ref 30.0–36.0)
MCV: 89.4 fL (ref 80.0–100.0)
Platelets: 178 10*3/uL (ref 150–400)
RBC: 3.57 MIL/uL — ABNORMAL LOW (ref 3.87–5.11)
RDW: 15 % (ref 11.5–15.5)
WBC: 10.5 10*3/uL (ref 4.0–10.5)
nRBC: 0 % (ref 0.0–0.2)

## 2020-10-29 LAB — CBC WITH DIFFERENTIAL/PLATELET
Abs Immature Granulocytes: 0.08 10*3/uL — ABNORMAL HIGH (ref 0.00–0.07)
Basophils Absolute: 0.1 10*3/uL (ref 0.0–0.1)
Basophils Relative: 1 %
Eosinophils Absolute: 0 10*3/uL (ref 0.0–0.5)
Eosinophils Relative: 0 %
HCT: 38.1 % (ref 36.0–46.0)
Hemoglobin: 12 g/dL (ref 12.0–15.0)
Immature Granulocytes: 1 %
Lymphocytes Relative: 13 %
Lymphs Abs: 1.9 10*3/uL (ref 0.7–4.0)
MCH: 28.2 pg (ref 26.0–34.0)
MCHC: 31.5 g/dL (ref 30.0–36.0)
MCV: 89.6 fL (ref 80.0–100.0)
Monocytes Absolute: 1.2 10*3/uL — ABNORMAL HIGH (ref 0.1–1.0)
Monocytes Relative: 8 %
Neutro Abs: 12.1 10*3/uL — ABNORMAL HIGH (ref 1.7–7.7)
Neutrophils Relative %: 77 %
Platelets: 197 10*3/uL (ref 150–400)
RBC: 4.25 MIL/uL (ref 3.87–5.11)
RDW: 15.4 % (ref 11.5–15.5)
WBC: 15.3 10*3/uL — ABNORMAL HIGH (ref 4.0–10.5)
nRBC: 0 % (ref 0.0–0.2)

## 2020-10-29 LAB — I-STAT CHEM 8, ED
BUN: 23 mg/dL (ref 8–23)
Calcium, Ion: 1.14 mmol/L — ABNORMAL LOW (ref 1.15–1.40)
Chloride: 106 mmol/L (ref 98–111)
Creatinine, Ser: 1 mg/dL (ref 0.44–1.00)
Glucose, Bld: 97 mg/dL (ref 70–99)
HCT: 31 % — ABNORMAL LOW (ref 36.0–46.0)
Hemoglobin: 10.5 g/dL — ABNORMAL LOW (ref 12.0–15.0)
Potassium: 4.9 mmol/L (ref 3.5–5.1)
Sodium: 133 mmol/L — ABNORMAL LOW (ref 135–145)
TCO2: 20 mmol/L — ABNORMAL LOW (ref 22–32)

## 2020-10-29 LAB — CREATININE, SERUM
Creatinine, Ser: 0.98 mg/dL (ref 0.44–1.00)
GFR, Estimated: 58 mL/min — ABNORMAL LOW (ref >60)

## 2020-10-29 LAB — URINALYSIS, ROUTINE W REFLEX MICROSCOPIC
Bilirubin Urine: NEGATIVE
Glucose, UA: NEGATIVE mg/dL
Ketones, ur: 20 mg/dL — AB
Nitrite: NEGATIVE
Protein, ur: 30 mg/dL — AB
Specific Gravity, Urine: 1.02 (ref 1.005–1.030)
pH: 5 (ref 5.0–8.0)

## 2020-10-29 LAB — PROTIME-INR
INR: 1.2 (ref 0.8–1.2)
Prothrombin Time: 15 seconds (ref 11.4–15.2)

## 2020-10-29 LAB — LACTIC ACID, PLASMA
Lactic Acid, Venous: 1 mmol/L (ref 0.5–1.9)
Lactic Acid, Venous: 1.8 mmol/L (ref 0.5–1.9)

## 2020-10-29 LAB — APTT: aPTT: 38 seconds — ABNORMAL HIGH (ref 24–36)

## 2020-10-29 MED ORDER — DOCUSATE SODIUM 100 MG PO CAPS
100.0000 mg | ORAL_CAPSULE | Freq: Two times a day (BID) | ORAL | Status: DC
  Administered 2020-10-30 – 2020-11-14 (×30): 100 mg via ORAL
  Filled 2020-10-29 (×28): qty 1

## 2020-10-29 MED ORDER — DULOXETINE HCL 60 MG PO CPEP
90.0000 mg | ORAL_CAPSULE | Freq: Every day | ORAL | Status: DC
  Administered 2020-10-30 – 2020-11-14 (×16): 90 mg via ORAL
  Filled 2020-10-29 (×17): qty 1

## 2020-10-29 MED ORDER — LACTATED RINGERS IV BOLUS (SEPSIS)
1000.0000 mL | Freq: Once | INTRAVENOUS | Status: AC
  Administered 2020-10-29: 1000 mL via INTRAVENOUS

## 2020-10-29 MED ORDER — DOCUSATE SODIUM 100 MG PO CAPS: 100.0000 mg | ORAL_CAPSULE | Freq: Two times a day (BID) | ORAL | Status: DC

## 2020-10-29 MED ORDER — DILTIAZEM HCL ER COATED BEADS 120 MG PO CP24
120.0000 mg | ORAL_CAPSULE | Freq: Every day | ORAL | Status: DC
  Administered 2020-10-29 – 2020-11-03 (×6): 120 mg via ORAL
  Filled 2020-10-29 (×8): qty 1

## 2020-10-29 MED ORDER — IOHEXOL 300 MG/ML  SOLN
80.0000 mL | Freq: Once | INTRAMUSCULAR | Status: AC | PRN
  Administered 2020-10-29: 80 mL via INTRAVENOUS

## 2020-10-29 MED ORDER — METRONIDAZOLE 500 MG/100ML IV SOLN
500.0000 mg | Freq: Once | INTRAVENOUS | Status: AC
  Administered 2020-10-29: 500 mg via INTRAVENOUS
  Filled 2020-10-29: qty 100

## 2020-10-29 MED ORDER — LACTATED RINGERS IV SOLN
INTRAVENOUS | Status: DC
Start: 2020-10-29 — End: 2020-10-29

## 2020-10-29 MED ORDER — SODIUM CHLORIDE 0.9 % IV SOLN
2.0000 g | Freq: Once | INTRAVENOUS | Status: AC
  Administered 2020-10-29: 2 g via INTRAVENOUS
  Filled 2020-10-29: qty 2

## 2020-10-29 MED ORDER — ACETAMINOPHEN 650 MG RE SUPP
650.0000 mg | Freq: Four times a day (QID) | RECTAL | Status: DC | PRN
  Administered 2020-11-06: 650 mg via RECTAL
  Filled 2020-10-29: qty 1

## 2020-10-29 MED ORDER — ACETAMINOPHEN 325 MG PO TABS
650.0000 mg | ORAL_TABLET | Freq: Four times a day (QID) | ORAL | Status: DC | PRN
  Administered 2020-10-30 – 2020-11-12 (×15): 650 mg via ORAL
  Filled 2020-10-29 (×16): qty 2

## 2020-10-29 MED ORDER — ACETAMINOPHEN 325 MG PO TABS: 650.0000 mg | ORAL_TABLET | Freq: Four times a day (QID) | ORAL | Status: DC | PRN

## 2020-10-29 MED ORDER — LACTATED RINGERS IV BOLUS (SEPSIS)
1000.0000 mL | Freq: Once | INTRAVENOUS | Status: AC
Start: 2020-10-29 — End: 2020-10-29
  Administered 2020-10-29: 1000 mL via INTRAVENOUS

## 2020-10-29 MED ORDER — ONDANSETRON HCL 4 MG PO TABS: 4.0000 mg | ORAL_TABLET | Freq: Four times a day (QID) | ORAL | Status: DC | PRN

## 2020-10-29 MED ORDER — VANCOMYCIN HCL 1250 MG/250ML IV SOLN
1250.0000 mg | Freq: Once | INTRAVENOUS | Status: AC
  Administered 2020-10-29: 1250 mg via INTRAVENOUS
  Filled 2020-10-29: qty 250

## 2020-10-29 MED ORDER — ENOXAPARIN SODIUM 30 MG/0.3ML IJ SOSY
30.0000 mg | PREFILLED_SYRINGE | INTRAMUSCULAR | Status: DC
  Administered 2020-10-29 – 2020-10-30 (×2): 30 mg via SUBCUTANEOUS
  Filled 2020-10-29 (×2): qty 0.3

## 2020-10-29 MED ORDER — FUROSEMIDE 20 MG PO TABS
40.0000 mg | ORAL_TABLET | Freq: Every day | ORAL | Status: DC
  Administered 2020-10-29: 40 mg via ORAL
  Filled 2020-10-29: qty 2

## 2020-10-29 MED ORDER — SODIUM CHLORIDE 0.9 % IV SOLN
2.0000 g | Freq: Three times a day (TID) | INTRAVENOUS | Status: DC
  Administered 2020-10-30 (×2): 2 g via INTRAVENOUS
  Filled 2020-10-29 (×2): qty 2

## 2020-10-29 MED ORDER — ONDANSETRON HCL 4 MG/2ML IJ SOLN
4.0000 mg | Freq: Four times a day (QID) | INTRAMUSCULAR | Status: DC | PRN
  Administered 2020-10-29: 4 mg via INTRAVENOUS
  Filled 2020-10-29 (×2): qty 2

## 2020-10-29 MED ORDER — HYDROCODONE-ACETAMINOPHEN 5-325 MG PO TABS
1.0000 | ORAL_TABLET | Freq: Four times a day (QID) | ORAL | Status: DC | PRN
Start: 2020-10-29 — End: 2020-11-12
  Administered 2020-10-30 – 2020-11-11 (×16): 1 via ORAL
  Filled 2020-10-29 (×18): qty 1

## 2020-10-29 MED ORDER — ONDANSETRON HCL 4 MG/2ML IJ SOLN: 4.0000 mg | Freq: Four times a day (QID) | INTRAMUSCULAR | Status: DC | PRN

## 2020-10-29 MED ORDER — PANTOPRAZOLE SODIUM 40 MG PO TBEC
40.0000 mg | DELAYED_RELEASE_TABLET | Freq: Every day | ORAL | Status: DC
  Administered 2020-10-29 – 2020-11-14 (×17): 40 mg via ORAL
  Filled 2020-10-29 (×17): qty 1

## 2020-10-29 MED ORDER — LACTATED RINGERS IV BOLUS
500.0000 mL | Freq: Once | INTRAVENOUS | Status: AC
  Administered 2020-10-29 (×2): 500 mL via INTRAVENOUS

## 2020-10-29 MED ORDER — ENOXAPARIN SODIUM 30 MG/0.3ML IJ SOSY: 30.0000 mg | PREFILLED_SYRINGE | INTRAMUSCULAR | Status: DC

## 2020-10-29 MED ORDER — ACETAMINOPHEN 650 MG RE SUPP: 650.0000 mg | Freq: Four times a day (QID) | RECTAL | Status: DC | PRN

## 2020-10-29 MED ORDER — LACTATED RINGERS IV SOLN: INTRAVENOUS | Status: DC

## 2020-10-29 MED ORDER — ASPIRIN EC 81 MG PO TBEC
81.0000 mg | DELAYED_RELEASE_TABLET | Freq: Every evening | ORAL | Status: DC
  Administered 2020-10-29 – 2020-11-14 (×17): 81 mg via ORAL
  Filled 2020-10-29 (×17): qty 1

## 2020-10-29 NOTE — ED Triage Notes (Signed)
Pt accompanied by daughter who states pt was sent here fom PCP office for dehydration. Pt has not been eating or drinking since Sunday, pt currently being treated for UTI with bactrim. Pt alert, has no complaints

## 2020-10-29 NOTE — ED Provider Notes (Signed)
Emergency Medicine Provider Triage Evaluation Note  Andrea Yu , a 80 y.o. female  was evaluated in triage.  Pt complains of poor p.o. intake.  She was treated for a UTI with Bactrim however has not been eating or drinking for the past 3 days.  She was seen at PCP where she was found to have leukocytosis and sent here for further evaluation.  She generally feels poorly.  Daughter reports that she is slightly confused compared to her usual.  Review of Systems  Positive: Kidney pain Negative: Fever  Physical Exam  BP (!) 123/103 (BP Location: Left Arm)   Pulse (!) 46   Temp 98.7 F (37.1 C) (Oral)   Resp 14   SpO2 98%  Gen:   Awake, appears unwell Resp:  Normal effort  MSK:   Moves extremities without difficulty  Other:  Heart rate is rapid, irregularly irregular.  Documented as 46, however appears much quicker.  EKG shows elevated rate concerning for A. fib RVR which I suspect is more accurate.  Medical Decision Making  Medically screening exam initiated at 2:22 PM.  Appropriate orders placed.  [name] was informed that the remainder of the evaluation will be completed by another provider, this initial triage assessment does not replace that evaluation, and the importance of remaining in the ED until their evaluation is complete.  RN spoke with charge nurse to get patient into her room.  EKG shows concern for RVR, given that she had UTI with papers from PCP showing that she grew out 2 different species and got worse after starting Bactrim I am concerned that she may be septic in addition to her dehydration and appears to be in A. fib RVR in the 150s.    Patient will be roomed.   Note: Portions of this report may have been transcribed using voice recognition software. Every effort was made to ensure accuracy; however, inadvertent computerized transcription errors may be present    Andrea Gong, PA-C 10/29/20 1425    Andrea Core, MD 10/29/20 1635

## 2020-10-29 NOTE — ED Notes (Signed)
Sarah RN to draw pt labs via straight stick since they hemolyzed off of line - Pt stated to that RN that she refuses any more blood work and that the next time she needs labs to bring in the doctor and that whoever keeps ordering these labs can "kiss her ass"

## 2020-10-29 NOTE — Sepsis Progress Note (Signed)
eLink is following this Code Sepsis. °

## 2020-10-29 NOTE — ED Provider Notes (Signed)
[facility] EMERGENCY DEPARTMENT Provider Note   CSN: [id number] Arrival date & time: 10/29/20  1227     History Chief Complaint  Patient presents with   Dehydration    Andrea Yu is a 80 y.o. female presenting for evaluation of generalized weakness, confusion, flank pain.  Patient states last week she was treated for UTI with Bactrim.  Finished on Friday, symptoms improved but then worsened.  Daughter, she is a bit more confused than normal, sleeping more often. Pt reports decrease urination and urinary odor. She also reports R sided flank pain. Called the primary care doctor, who referred her to the ER. She denies fevers, cp, sob. No abnormal BMs.  She is not currently on any antibiotics.  She lives at home with her daughter.  She has had all her medicines today.  She is not on anticoagulation  Additional history attained from chart review.  Patient with a history of PAF, anemia, RA, hypertension, psoriatic arthritis  HPI     Past Medical History:  Diagnosis Date   A-fib (HCC)    "just after knee 2nd OR" (11/07/2012)   Anemia    Arthritis    RA , SACROTIC ARTHRITIS   Complication of anesthesia    found to be in afib when she woke up   Dysrhythmia    GERD (gastroesophageal reflux disease)    Head injury, closed, with concussion    History of blood transfusion    "after 1 of my knee ORs and today" (11/07/2012)   Hypertension    Peripheral neuropathy    "from back OR in 2005; in hands and feet" (11/07/2012)   Psoriatic arthritis (HCC)    on Prednisone Rx    Patient Active Problem List   Diagnosis Date Noted   Severe sepsis (HCC) 10/29/2020   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/15/2020   Fatigue 08/15/2020   Polyarthralgia 08/15/2020   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   Permanent atrial fibrillation (HCC) 05/17/2019   Dyslipidemia (high LDL; low HDL) 05/17/2019   S/P PICC central line placement 01/24/2018   Diarrhea 12/14/2017    Left buttock abscess 12/06/2017   Discitis of thoracic region 05/12/2017   Medication monitoring encounter 05/12/2017   Hx of adverse drug reaction    History of blood transfusion    GERD (gastroesophageal reflux disease)    Dysrhythmia    Complication of anesthesia    Arthritis    Anemia    Displaced spiral fracture of shaft of right tibia, subsequent encounter for closed fracture with delayed healing 11/11/2016   Status post ankle arthrodesis 01/07/2016   Osteomyelitis of pelvis (HCC)    SAH (subarachnoid hemorrhage) (HCC) 09/14/2014   Essential hypertension 09/14/2014   Obesity (BMI 30-39.9)- suspected sleep apnea, declines sleep study 11/10/2012   Psoriatic arthritis (HCC) 11/06/2012   Iron deficiency anemia- transfused this admission 11/06/2012   Depression 11/06/2012   Peripheral neuropathy 11/06/2012   Adrenal insufficiency (HCC) 08/09/2012   PAF (paroxysmal atrial fibrillation) (HCC) 08/09/2012    Past Surgical History:  Procedure Laterality Date   ANKLE FUSION Right 01/07/2016   Procedure: Right Tibiocalcaneal Fusion;  Surgeon: Nadara Mustard, MD;  Location: [facility] OR;  Service: Orthopedics;  Laterality: Right;   BACK SURGERY     CATARACT EXTRACTION W/ INTRAOCULAR LENS  IMPLANT, BILATERAL Bilateral 2007   CHOLECYSTECTOMY  2000's   COLONOSCOPY N/A 11/10/2012   Procedure: COLONOSCOPY;  Surgeon: Theda Belfast, MD;  Location: [facility] ENDOSCOPY;  Service: Endoscopy;  Laterality:  N/A;   ESOPHAGOGASTRODUODENOSCOPY N/A 11/10/2012   Procedure: ESOPHAGOGASTRODUODENOSCOPY (EGD);  Surgeon: Theda Belfast, MD;  Location: [facility] ENDOSCOPY;  Service: Endoscopy;  Laterality: N/A;   EYE SURGERY     JOINT REPLACEMENT     POSTERIOR LUMBAR FUSION  2005   "got a rod and 4 pins in there" (11/07/2012)   REPLACEMENT TOTAL KNEE BILATERAL Bilateral 2007   TRANSTHORACIC ECHOCARDIOGRAM  05/24/2006   TUBAL LIGATION  1974   VAGINAL HYSTERECTOMY  1983     OB History   No obstetric history on file.      Family History  Problem Relation Age of Onset   Heart attack Father        1951-deceased    Liver disease Mother    Aneurysm Son 84       Deceased     Social History   Tobacco Use   Smoking status: Former    Packs/day: 0.50    Years: 30.00    Pack years: 15.00    Types: Cigarettes    Quit date: 08/09/1981    Years since quitting: 39.2   Smokeless tobacco: Never  Substance Use Topics   Alcohol use: No   Drug use: No    Home Medications Prior to Admission medications   Medication Sig Start Date End Date Taking? Authorizing Provider  acetaminophen (TYLENOL) 325 MG tablet Take 2 tablets (650 mg total) by mouth every 4 (four) hours as needed. Patient taking differently: Take 325 mg by mouth every 6 (six) hours as needed (for pain.).  11/11/12   Christiane Ha, MD  albuterol (PROAIR HFA) 108 (90 Base) MCG/ACT inhaler 2 puffs as needed    [provider]  Amino Acids-Protein Hydrolys (FEEDING SUPPLEMENT, PRO-STAT SUGAR FREE 64,) LIQD Take 30 mLs by mouth 2 (two) times daily. 06/14/17   Ghimire, Werner Lean, MD  Apremilast (OTEZLA) 30 MG TABS Take 1 tablet by mouth 2 (two) times daily.    [provider]  aspirin 81 MG chewable tablet 1 tablet    [provider]  aspirin EC 81 MG tablet Take 81 mg by mouth every evening.    [provider]  benzonatate (TESSALON) 100 MG capsule Take 100 mg by mouth 3 (three) times daily as needed. 07/09/20   [provider]  Biotin (BIOTIN MAXIMUM STRENGTH) 5 MG CAPS 1 capsule    [provider]  Biotin 5000 MCG CAPS Take by mouth 2 (two) times a day.    [provider]  capsaicin (ZOSTRIX) 0.025 % cream apply to painful areas at least twice daily 08/20/09   [provider]  CARTIA XT 120 MG 24 hr capsule TAKE 1 CAPSULE BY MOUTH  DAILY 03/18/20   Croitoru, Mihai, MD  clotrimazole (LOTRIMIN AF) 1 % cream Apply to affected area BID 09/19/18   [provider]  Coenzyme  Q10 (COQ10) 100 MG CAPS daily. 02/03/09   [provider]  diclofenac Sodium (VOLTAREN) 1 % GEL SMARTSIG:Gram(s) Topical Twice Daily PRN 12/15/19   [provider]  diltiazem (DILACOR XR) 120 MG 24 hr capsule Take 1 capsule by mouth at bedtime. 08/29/18   [provider]  DULoxetine (CYMBALTA) 30 MG capsule Take 90 mg by mouth daily. Take along with 60 mg capsule=90 mg 03/24/17   [provider]  DULoxetine (CYMBALTA) 60 MG capsule Take 90 mg by mouth daily. Take along with  capsule to make a total of      [provider]  DULoxetine (CYMBALTA) 60 MG capsule 1 capsule    [provider]  Flaxseed, Linseed, (FLAX SEED OIL PO) Take by mouth. Take 1 capsule daily    [provider]  fluconazole (DIFLUCAN) 150 MG tablet Take 150 mg by mouth daily. 03/25/20   [provider]  furosemide (LASIX) 40 MG tablet 2 tablet    [provider]  gabapentin (NEURONTIN) 600 MG tablet Take 600 mg by mouth 2 (two) times daily. 2 tablets in the am one tablet at noon 2 tablets before bed.    [provider]  gabapentin (NEURONTIN) 600 MG tablet 2 tablets    [provider]  HYDROcodone-acetaminophen (NORCO/VICODIN) 5-325 MG tablet 1 tablet as needed    [provider]  hydroxypropyl methylcellulose / hypromellose (ISOPTO TEARS / GONIOVISC) 2.5 % ophthalmic solution Place 1 drop into both eyes 3 (three) times daily as needed for dry eyes.    [provider]  Lido-Capsaicin-Men-Methyl Sal (LIDOPRO) 4-.323-11-21.5 % OINT apply to legs at bedtime to reduce pain 08/03/19   [provider]  lidocaine (XYLOCAINE) 5 % ointment 1 application as needed 04/26/20   [provider]  Lidocaine HCl (ASPERCREME W/LIDOCAINE) 4 % CREA Apply to painful feet at bedtime, as needed 10/23/18   [provider]  losartan (COZAAR) 100 MG tablet Take 100 mg by mouth daily.  03/09/17   [provider]   losartan (COZAAR) 100 MG tablet 1 tablet    [provider]  Magnesium 250 MG TABS 1 tablet with a meal    [provider]  methocarbamol (ROBAXIN) 500 MG tablet Take 1 tablet (500 mg total) by mouth every 8 (eight) hours as needed for muscle spasms. 04/22/17   Noralee Stain, DO  metoprolol succinate (TOPROL XL) 100 MG 24 hr tablet 1/2 tablet    [provider]  Multiple Vitamin (MULTIVITAMIN WITH MINERALS) TABS Take 1 tablet by mouth daily.    [provider]  Multiple Vitamin (MV-ONE) CAPS daily. 02/27/16   [provider]  Multiple Vitamins-Minerals (MULTIVITAMIN GUMMIES ADULT) CHEW     [provider]  mupirocin cream (BACTROBAN) 2 % apply to affected area BID 09/07/17   [provider]  nitrofurantoin (MACRODANTIN) 100 MG capsule Take 100 mg by mouth 2 (two) times daily. 07/14/20   [provider]  nystatin cream (MYCOSTATIN) apply a small amount to affected area daily 05/11/16   [provider]  Omega-3 Fatty Acids (FISH OIL) 1000 MG CAPS 1 capsule    [provider]  omeprazole (PRILOSEC) 20 MG capsule Take 20 mg by mouth every evening. 11/08/15   [provider]  potassium chloride SA (K-DUR,KLOR-CON) 20 MEQ tablet Take 20 mEq by mouth 2 (two) times daily.     [provider]  predniSONE (DELTASONE) 5 MG tablet 1 tablet    [provider]  senna-docusate (SENOKOT-S) 8.6-50 MG tablet Take by mouth.    [provider]  sodium hypochlorite (DAKIN'S 1/2 STRENGTH) external solution Apply to left buttocks wound with packing daily 03/13/18   [provider]  sulfamethoxazole-trimethoprim (BACTRIM DS) 800-160 MG tablet Take 1 tablet by mouth 2 (two) times daily. 03/25/20   [provider]  Teriparatide, Recombinant, (FORTEO) 600 MCG/2.4ML SOPN 0.08 ml    [provider]  Vitamin D, Ergocalciferol, (DRISDOL) 50000 UNITS CAPS Take 50,000 Units by mouth  every Tuesday.     [provider]    Allergies    Penicillins, Prolia [  denosumab], Ceftriaxone, Ciprofloxacin hcl, Fosamax [alendronate sodium], and Tubersol [tuberculin ppd]  Review of Systems   Review of Systems  Genitourinary:  Positive for flank pain.       Creased urination and urinary odor  Neurological:  Positive for weakness.  Psychiatric/Behavioral:  Positive for confusion.   All other systems reviewed and are negative.  Physical Exam Updated Vital Signs BP (!) 104/53   Pulse (!) 113   Temp 98.7 F (37.1 C) (Oral)   Resp 13   SpO2 99%   Physical Exam Vitals and nursing note reviewed.  Constitutional:      Appearance: Normal appearance. She is obese. She is ill-appearing.  HENT:     Head: Normocephalic and atraumatic.     Mouth/Throat:     Mouth: Mucous membranes are dry.     Comments: MM dry Eyes:     Conjunctiva/sclera: Conjunctivae normal.     Pupils: Pupils are equal, round, and reactive to light.  Cardiovascular:     Rate and Rhythm: Tachycardia present. Rhythm irregular.     Pulses: Normal pulses.     Comments: Tachycardic and irregularly irregular.  Heart rate ranging between upper 90s and 130s HR increases when pt sits up Pulmonary:     Effort: Pulmonary effort is normal. No respiratory distress.     Breath sounds: Normal breath sounds. No wheezing.     Comments: Speaking in full sentences.  Clear lung sounds in all fields. Abdominal:     General: There is no distension.     Palpations: Abdomen is soft. There is no mass.     Tenderness: There is no abdominal tenderness. There is no right CVA tenderness, left CVA tenderness, guarding or rebound.     Comments: No ttp of the abd. No cva tenderness  Musculoskeletal:        General: Normal range of motion.     Cervical back: Normal range of motion and neck supple.  Skin:    General: Skin is warm and dry.     Capillary Refill: Capillary refill takes less than 2 seconds.  Neurological:      Mental Status: She is alert and oriented to person, place, and time.  Psychiatric:        Mood and Affect: Mood and affect normal.        Speech: Speech normal.        Behavior: Behavior normal.    ED Results / Procedures / Treatments   Labs (all labs ordered are listed, but only abnormal results are displayed) Labs Reviewed  COMPREHENSIVE METABOLIC PANEL - Abnormal; Notable for the following components:      Result Value   Sodium 133 (*)    CO2 18 (*)    Glucose, Bld 105 (*)    BUN 24 (*)    Creatinine, Ser 1.22 (*)    Albumin 2.9 (*)    GFR, Estimated 45 (*)    All other components within normal limits  CBC WITH DIFFERENTIAL/PLATELET - Abnormal; Notable for the following components:   WBC 15.3 (*)    Neutro Abs 12.1 (*)    Monocytes Absolute 1.2 (*)    Abs Immature Granulocytes 0.08 (*)    All other components within normal limits  APTT - Abnormal; Notable for the following components:   aPTT 38 (*)    All other components within normal limits  URINALYSIS, ROUTINE W REFLEX MICROSCOPIC - Abnormal; Notable for the following components:   Color, Urine AMBER (*)  APPearance HAZY (*)    Hgb urine dipstick MODERATE (*)    Ketones, ur 20 (*)    Protein, ur 30 (*)    Leukocytes,Ua MODERATE (*)    Bacteria, UA RARE (*)    All other components within normal limits  I-STAT CHEM 8, ED - Abnormal; Notable for the following components:   Sodium 133 (*)    Calcium, Ion 1.14 (*)    TCO2 20 (*)    Hemoglobin 10.5 (*)    HCT 31.0 (*)    All other components within normal limits  RESP PANEL BY RT-PCR (FLU A&B, COVID) ARPGX2  CULTURE, BLOOD (ROUTINE X 2)  CULTURE, BLOOD (ROUTINE X 2)  URINE CULTURE  LACTIC ACID, PLASMA  PROTIME-INR  LACTIC ACID, PLASMA  MISCELLANEOUS GENETIC TEST  PROTIME-INR  CORTISOL-AM, BLOOD  PROCALCITONIN  CBC  CREATININE, SERUM  COMPREHENSIVE METABOLIC PANEL    EKG EKG Interpretation  Date/Time:  Wednesday October 29 2020 14:12:09  EDT Ventricular Rate:  148 PR Interval:    QRS Duration: 72 QT Interval:  282 QTC Calculation: 442 R Axis:   61 Text Interpretation: Atrial fibrillation with rapid ventricular response Abnormal ECG increased rate from prior 5/19 Confirmed by Meridee Score [phone]) on 10/29/2020 2:48:21 PM  Radiology CT ABDOMEN PELVIS W CONTRAST  Result Date: 10/29/2020 CLINICAL DATA:  Abdominal abscess or infection. EXAM: CT ABDOMEN AND PELVIS WITH CONTRAST TECHNIQUE: Multidetector CT imaging of the abdomen and pelvis was performed using the standard protocol following bolus administration of intravenous contrast. CONTRAST:  65mL OMNIPAQUE IOHEXOL 300 MG/ML  SOLN COMPARISON:  Jun 10, 2017. FINDINGS: Lower chest: No acute abnormality. Hepatobiliary: No focal liver abnormality is seen. Status post cholecystectomy. No biliary dilatation. Pancreas: Unremarkable. No pancreatic ductal dilatation or surrounding inflammatory changes. Spleen: Normal in size without focal abnormality. Adrenals/Urinary Tract: Adrenal glands appear normal. Right renal cyst is noted. No hydronephrosis or renal obstruction is noted. Urinary bladder is unremarkable. Stomach/Bowel: Stomach is within normal limits. Appendix appears normal. No evidence of bowel wall thickening, distention, or inflammatory changes. Vascular/Lymphatic: Aortic atherosclerosis. No enlarged abdominal or pelvic lymph nodes. Reproductive: Status post hysterectomy. No adnexal masses. Other: 2 moderate size fat containing periumbilical hernias are noted. No ascites is noted. 6.1 x 3.3 cm fluid collection is seen in the inferior portion of the right buttocks region inferior to right ischial tuberosity. Musculoskeletal: No acute or significant osseous findings. IMPRESSION: 6.1 x 3.3 cm fluid collection is seen in the inferior portion of the right buttocks region inferior to right ischial tuberosity. This was present on prior exam. Potentially this may represent abscess. Two moderate  size fat containing periumbilical hernias are noted. Aortic Atherosclerosis (ICD10-I70.0). Electronically Signed   By: Lupita Raider M.D.   On: 10/29/2020 16:40   DG Chest Port 1 View  Result Date: 10/29/2020 CLINICAL DATA:  Possible sepsis, dehydration. EXAM: PORTABLE CHEST 1 VIEW COMPARISON:  Jun 06, 2017. FINDINGS: The heart size and mediastinal contours are within normal limits. Both lungs are clear. The visualized skeletal structures are unremarkable. IMPRESSION: No active disease. Electronically Signed   By: Lupita Raider M.D.   On: 10/29/2020 15:10    Procedures .Critical Care Performed by: Alveria Apley, PA-C Authorized by: Alveria Apley, PA-C   Critical care provider statement:    Critical care time (minutes):  50   Critical care time was exclusive of:  Separately billable procedures and treating other patients and teaching time   Critical care was necessary to treat  or prevent imminent or life-threatening deterioration of the following conditions:  Sepsis and dehydration   Critical care was time spent personally by me on the following activities:  Blood draw for specimens, development of treatment plan with patient or surrogate, evaluation of patient's response to treatment, examination of patient, obtaining history from patient or surrogate, ordering and performing treatments and interventions, ordering and review of laboratory studies, ordering and review of radiographic studies, pulse oximetry, re-evaluation of patient's condition and review of old charts   I assumed direction of critical care for this patient from another provider in my specialty: no     Care discussed with: admitting provider     Medications Ordered in ED Medications  lactated ringers infusion (has no administration in time range)  lactated ringers infusion (has no administration in time range)  vancomycin (VANCOREADY) IVPB 1250 mg/250 mL (1,250 mg Intravenous New Bag/Given 10/29/20 1644)   acetaminophen (TYLENOL) tablet 650 mg (has no administration in time range)    Or  acetaminophen (TYLENOL) suppository 650 mg (has no administration in time range)  docusate sodium (COLACE) capsule 100 mg (has no administration in time range)  ondansetron (ZOFRAN) tablet 4 mg (has no administration in time range)    Or  ondansetron (ZOFRAN) injection 4 mg (has no administration in time range)  enoxaparin (LOVENOX) injection 30 mg (has no administration in time range)  acetaminophen (TYLENOL) tablet 650 mg (has no administration in time range)    Or  acetaminophen (TYLENOL) suppository 650 mg (has no administration in time range)  docusate sodium (COLACE) capsule 100 mg (has no administration in time range)  ondansetron (ZOFRAN) tablet 4 mg (has no administration in time range)    Or  ondansetron (ZOFRAN) injection 4 mg (has no administration in time range)  lactated ringers bolus 1,000 mL (0 mLs Intravenous Stopped 10/29/20 1606)    And  lactated ringers bolus 1,000 mL (1,000 mLs Intravenous New Bag/Given 10/29/20 1638)    And  lactated ringers bolus 1,000 mL (0 mLs Intravenous Stopped 10/29/20 1606)  aztreonam (AZACTAM) 2 g in sodium chloride 0.9 % 100 mL IVPB (0 g Intravenous Stopped 10/29/20 1617)  metroNIDAZOLE (FLAGYL) IVPB 500 mg (500 mg Intravenous New Bag/Given 10/29/20 1640)  iohexol (OMNIPAQUE) 300 MG/ML solution 80 mL (80 mLs Intravenous Contrast Given 10/29/20 1624)    ED Course  I have reviewed the triage vital signs and the nursing notes.  Pertinent labs & imaging results that were available during my care of the patient were reviewed by me and considered in my medical decision making (see chart for details).  Clinical Course as of 10/29/20 1743  Wed Oct 29, 2020  1522 80 yo female w/ hx of flank pain, treated with bactrim last week, ended on Friday, now with recurring flank pain, increased fatigue, confusion.  Here with A Fib & RVR itnermittently, increases to 150's with  motion but 100's at rest.  Pending IV fluids for borderline hypotension, CT imaging, UA, infectious w/u.  Will consider rate control if HR elevates again [MT]  1658 UA suggestive of infection - plan to admit on antibiotics, BP has been stable (she reports low BP at baseline), HR has been relatively stable at 110-120bpm, holding off on rate control meds with her BP low for now [MT]  1659 CT reviewed - also concerning for possible ischemic abscess [MT]    Clinical Course User Index [MT] Trifan, Kermit Balo, MD   MDM Rules/Calculators/A&P  Patient presenting for evaluation of generalized weakness, confusion, decreased urination.  On exam, patient appears ill.  She is tachycardic with an irregularly irregular rhythm, concern for A. fib with RVR.  However she has no abdominal tenderness.  No signs of infected sacral abscess, patient does have a spot from previous IR drainage but no fluid drainage, surrounding tenderness or erythema.  Concern for UTI causing sepsis.  Also consider dehydration.  Consider A. fib as a cause of patient's symptoms.  She will need labs, chest x-ray, cardiac monitoring, CT abdomen pelvis, fluids, antibiotics, reassessment.  Metabolic panel consistent with dehydration with a slightly elevated creatinine from baseline.  Otherwise labs are reassuring.  Chest x-ray viewed and independently interpreted by me, no pneumonia without some effusion.  CT does not show any acute findings.  Does show a fluid collection at the sacrum, however per chart review this is chronic and on physical exam this does not appear to be infected.  Doubt this is the source.  Urine shows signs of infection, this is likely the cause of patient's symptoms.  We will continue to treat with antibiotics.  Patient's blood pressure improved with fluids, will continue.  Patient is already receiving broad-spectrum antibiotics.  Sepsis reassessment performed.  Will call for admission.  Discussed  with Dr. Lucianne Muss from [facility] hospitalist service, pt to be admitted.   Final Clinical Impression(s) / ED Diagnoses Final diagnoses:  Sepsis without acute organ dysfunction, due to unspecified organism [facility])  Urinary tract infection without hematuria, site unspecified  Atrial fibrillation with RVR (HCC)  Dehydration    Rx / DC Orders ED Discharge Orders     None        Alveria Apley, PA-C 10/29/20 1743    Terald Sleeper, MD 10/29/20 2108

## 2020-10-29 NOTE — H&P (Addendum)
History and Physical    JODILYN GIESE OVF:[id number] DOB: 1940-11-25 DOA: 10/29/2020  PCP: Jarome Matin, MD   Patient coming from: Home.  I have personally briefly reviewed patient's old medical records in [facility].  Chief Complaint: Generalized weakness, Confusion and increased urinary burning.  HPI: JACKQUELYN SUNDBERG is a 80 y.o. female with medical history significant for paroxysmal A. fib, GERD, neuropathy, morbid obesity, psoriatic arthritis, iron deficiency anemia, history of subarachnoid hemorrhage, essential hypertension presented in the ED with increased urinary frequency and decreased p.o. intake.  Patient report having UTI and was prescribed Bactrim by her PCP.  she has completed the course despite that she still has increased frequency and for last 3 days she has decreased p.o. intake.  Daughter at bedside reports she was slightly confused.  Her PCP has done blood work and found to have leukocytosis and advised to go to the emergency room.  She denies any fever, chest pain, shortness of breath, chills, dizziness.  Daughter reports she has urinary odor.  She denies any sick contact, recent travel.   ED Course: In the ED she was tachycardic, hypotensive, tachypneic, afebrile with normal SPO2. HR 111, BP 99/67, RR 24, SPO2 99, temp 98.7  Labs include sodium 133, potassium 4.9, chloride 103, bicarb 18, glucose 105, BUN 24, creatinine 1.0, calcium 9.0, alkaline phosphatase 76, AST 24, ALT 19, total bilirubin 0.9, lactic acid 1.0, WBC 15.3, hemoglobin 12.0, hematocrit 38.1, platelet 197, influenza negative COVID-negative, UA moderate leukocytes, nitrates negative.  Blood cultures urine cultures were .  Patient is started on antibiotic. CT abdomen :1 x 3.3 cm fluid collection is seen in the inferior portion of the right buttocks region inferior to right ischial tuberosity. This was present on prior exam. Potentially this may represent abscess.  Review of Systems:   Review of  Systems  Constitutional:  Positive for malaise/fatigue.  HENT: Negative.    Eyes: Negative.   Respiratory: Negative.    Cardiovascular: Negative.   Gastrointestinal: Negative.   Genitourinary:  Positive for flank pain, frequency and urgency.  Skin: Negative.   Neurological:        Generalized weakness  Endo/Heme/Allergies: Negative.   Psychiatric/Behavioral:  Positive for depression. The patient is nervous/anxious.     Past Medical History:  Diagnosis Date   A-fib [facility])    "just after knee 2nd OR" (11/07/2012)   Anemia    Arthritis    RA , SACROTIC ARTHRITIS   Complication of anesthesia    found to be in afib when she woke up   Dysrhythmia    GERD (gastroesophageal reflux disease)    Head injury, closed, with concussion    History of blood transfusion    "after 1 of my knee ORs and today" (11/07/2012)   Hypertension    Peripheral neuropathy    "from back OR in 2005; in hands and feet" (11/07/2012)   Psoriatic arthritis (HCC)    on Prednisone Rx    Past Surgical History:  Procedure Laterality Date   ANKLE FUSION Right 01/07/2016   Procedure: Right Tibiocalcaneal Fusion;  Surgeon: Nadara Mustard, MD;  Location: [facility] OR;  Service: Orthopedics;  Laterality: Right;   BACK SURGERY     CATARACT EXTRACTION W/ INTRAOCULAR LENS  IMPLANT, BILATERAL Bilateral 2007   CHOLECYSTECTOMY  2000's   COLONOSCOPY N/A 11/10/2012   Procedure: COLONOSCOPY;  Surgeon: Theda Belfast, MD;  Location: [facility] ENDOSCOPY;  Service: Endoscopy;  Laterality: N/A;   ESOPHAGOGASTRODUODENOSCOPY N/A 11/10/2012  Procedure: ESOPHAGOGASTRODUODENOSCOPY (EGD);  Surgeon: Theda Belfast, MD;  Location: [facility] ENDOSCOPY;  Service: Endoscopy;  Laterality: N/A;   EYE SURGERY     JOINT REPLACEMENT     POSTERIOR LUMBAR FUSION  2005   "got a rod and 4 pins in there" (11/07/2012)   REPLACEMENT TOTAL KNEE BILATERAL Bilateral 2007   TRANSTHORACIC ECHOCARDIOGRAM  05/24/2006   TUBAL LIGATION  1974   VAGINAL HYSTERECTOMY  1983      reports that she quit smoking about 39 years ago. Her smoking use included cigarettes. She has a 15.00 pack-year smoking history. She has never used smokeless tobacco. She reports that she does not drink alcohol and does not use drugs.  Allergies  Allergen Reactions   Penicillins Anaphylaxis    THROAT SWELLING  Has patient had a PCN reaction causing immediate rash, facial/tongue/throat swelling, SOB or lightheadedness with hypotension:  # # YES # #  Has patient had a PCN reaction causing severe rash involving mucus membranes or skin necrosis: # # # YES # # # Has patient had a PCN reaction that required hospitalization:No Has patient had a PCN reaction occurring within the last 10 years:  # # YES # #  If all of the above answers are "NO", then may proceed with Cephalosporin use.    Prolia [Denosumab] Other (See Comments)    Severe Pain in the groin area.   Ceftriaxone     Possible cause of neutropenia   Ciprofloxacin Hcl Other (See Comments)   Fosamax [Alendronate Sodium]     UNSPECIFIED REACTION    Latex Swelling   Lisinopril Cough   Tubersol [Tuberculin Ppd] Other (See Comments)    Unknown     Family History  Problem Relation Age of Onset   Heart attack Father        1951-deceased    Liver disease Mother    Aneurysm Son 32       Deceased    Family history reviewed and not pertinent   Prior to Admission medications   Medication Sig Start Date End Date Taking? Authorizing Provider  acetaminophen (TYLENOL) 325 MG tablet Take 2 tablets (650 mg total) by mouth every 4 (four) hours as needed. Patient taking differently: Take 325 mg by mouth every 6 (six) hours as needed (for pain.).  11/11/12   Christiane Ha, MD  albuterol (PROAIR HFA) 108 (90 Base) MCG/ACT inhaler 2 puffs as needed    [provider]  Amino Acids-Protein Hydrolys (FEEDING SUPPLEMENT, PRO-STAT SUGAR FREE 64,) LIQD Take 30 mLs by mouth 2 (two) times daily. 06/14/17   Ghimire, Werner Lean, MD   Apremilast (OTEZLA) 30 MG TABS Take 1 tablet by mouth 2 (two) times daily.    [provider]  aspirin 81 MG chewable tablet 1 tablet    [provider]  aspirin EC 81 MG tablet Take 81 mg by mouth every evening.    [provider]  benzonatate (TESSALON) 100 MG capsule Take 100 mg by mouth 3 (three) times daily as needed. 07/09/20   [provider]  Biotin (BIOTIN MAXIMUM STRENGTH) 5 MG CAPS 1 capsule    [provider]  Biotin 5000 MCG CAPS Take by mouth 2 (two) times a day.    [provider]  capsaicin (ZOSTRIX) 0.025 % cream apply to painful areas at least twice daily 08/20/09   [provider]  CARTIA XT 120 MG 24 hr capsule TAKE 1 CAPSULE BY MOUTH  DAILY 03/18/20  Croitoru, Mihai, MD  clotrimazole (LOTRIMIN AF) 1 % cream Apply to affected area BID 09/19/18   [provider]  Coenzyme Q10 (COQ10) 100 MG CAPS daily. 02/03/09   [provider]  diclofenac Sodium (VOLTAREN) 1 % GEL SMARTSIG:Gram(s) Topical Twice Daily PRN 12/15/19   [provider]  diltiazem (DILACOR XR) 120 MG 24 hr capsule Take 1 capsule by mouth at bedtime. 08/29/18   [provider]  DULoxetine (CYMBALTA) 30 MG capsule Take 90 mg by mouth daily. Take along with 60 mg capsule=90 mg 03/24/17   [provider]  DULoxetine (CYMBALTA) 60 MG capsule Take 90 mg by mouth daily. Take along with 30mg  capsule to make a total of 90mg      [provider]  DULoxetine (CYMBALTA) 60 MG capsule 1 capsule    [provider]  Flaxseed, Linseed, (FLAX SEED OIL PO) Take by mouth. Take 1 capsule daily    [provider]  fluconazole (DIFLUCAN) 150 MG tablet Take 150 mg by mouth daily. 03/25/20   [provider]  furosemide (LASIX) 40 MG tablet 2 tablet    [provider]  gabapentin (NEURONTIN) 600 MG tablet Take 600 mg by mouth 2 (two) times daily. 2 tablets in the am one tablet at noon 2 tablets  before bed.    [provider]  gabapentin (NEURONTIN) 600 MG tablet 2 tablets    [provider]  HYDROcodone-acetaminophen (NORCO/VICODIN) 5-325 MG tablet 1 tablet as needed    [provider]  hydroxypropyl methylcellulose / hypromellose (ISOPTO TEARS / GONIOVISC) 2.5 % ophthalmic solution Place 1 drop into both eyes 3 (three) times daily as needed for dry eyes.    [provider]  Lido-Capsaicin-Men-Methyl Sal (LIDOPRO) 4-.323-11-21.5 % OINT apply to legs at bedtime to reduce pain 08/03/19   [provider]  lidocaine (XYLOCAINE) 5 % ointment 1 application as needed 04/26/20   [provider]  Lidocaine HCl (ASPERCREME W/LIDOCAINE) 4 % CREA Apply to painful feet at bedtime, as needed 10/23/18   [provider]  losartan (COZAAR) 100 MG tablet Take 100 mg by mouth daily.  03/09/17   [provider]  losartan (COZAAR) 100 MG tablet 1 tablet    [provider]  Magnesium 250 MG TABS 1 tablet with a meal    [provider]  methocarbamol (ROBAXIN) 500 MG tablet Take 1 tablet (500 mg total) by mouth every 8 (eight) hours as needed for muscle spasms. 04/22/17   03/11/17, DO  metoprolol succinate (TOPROL XL) 100 MG 24 hr tablet 1/2 tablet    [provider]  Multiple Vitamin (MULTIVITAMIN WITH MINERALS) TABS Take 1 tablet by mouth daily.    [provider]  Multiple Vitamin (MV-ONE) CAPS daily. 02/27/16   [provider]  Multiple Vitamins-Minerals (MULTIVITAMIN GUMMIES ADULT) CHEW     [provider]  mupirocin cream (BACTROBAN) 2 % apply to affected area BID 09/07/17   [provider]  nitrofurantoin (MACRODANTIN) 100 MG capsule Take 100 mg by mouth 2 (two) times daily. 07/14/20   [provider]  nystatin cream (MYCOSTATIN) apply a small amount to affected area daily 05/11/16   [provider]  Omega-3 Fatty Acids (FISH OIL) 1000 MG CAPS 1 capsule     [provider]  omeprazole (PRILOSEC) 20 MG capsule Take 20 mg by mouth every evening. 11/08/15   [provider]  potassium chloride SA (K-DUR,KLOR-CON) 20 MEQ tablet Take 20 mEq by  mouth 2 (two) times daily.     [provider]  predniSONE (DELTASONE) 5 MG tablet 1 tablet    [provider]  senna-docusate (SENOKOT-S) 8.6-50 MG tablet Take by mouth.    [provider]  sodium hypochlorite (DAKIN'S 1/2 STRENGTH) external solution Apply to left buttocks wound with packing daily 03/13/18   [provider]  sulfamethoxazole-trimethoprim (BACTRIM DS) 800-160 MG tablet Take 1 tablet by mouth 2 (two) times daily. 03/25/20   [provider]  Teriparatide, Recombinant, (FORTEO) 600 MCG/2.4ML SOPN 0.08 ml    [provider]  Vitamin D, Ergocalciferol, (DRISDOL) 50000 UNITS CAPS Take 50,000 Units by mouth every Tuesday.     [provider]    Physical Exam: Vitals:   10/29/20 1700 10/29/20 1742 10/29/20 1800 10/29/20 1830  BP: (!) 104/53 120/76 126/75 122/83  Pulse: (!) 113 (!) 111 (!) 113 (!) 112  Resp: 13 (!) 24 12 13   Temp:      TempSrc:      SpO2: 99% 99% 99% 99%    Constitutional: Appears comfortable, not in any acute distress. Vitals:   10/29/20 1700 10/29/20 1742 10/29/20 1800 10/29/20 1830  BP: (!) 104/53 120/76 126/75 122/83  Pulse: (!) 113 (!) 111 (!) 113 (!) 112  Resp: 13 (!) 24 12 13   Temp:      TempSrc:      SpO2: 99% 99% 99% 99%   Eyes: PERRL, lids and conjunctivae normal ENMT: Mucous membranes are moist. Posterior pharynx clear of any exudate or lesions.Normal dentition.  Neck: normal, supple, no masses, no thyromegaly Respiratory: clear to auscultation bilaterally, no wheezing, no crackles.No accessory muscle use.  Cardiovascular: Regular rate and rhythm, no murmurs / rubs / gallops. No extremity edema. 2+ pedal pulses. No carotid bruits.  Abdomen: No tenderness, umbilical hernia noted, no  hepatosplenomegaly. Bowel sounds positive.  Musculoskeletal: no clubbing / cyanosis. No joint deformity upper and lower extremities. Good ROM, no contractures. Normal muscle tone.  Skin: no rashes, lesions, ulcers. No induration Neurologic: CN 2-12 grossly intact. Sensation intact, DTR normal. Strength 5/5 in all 4.  Psychiatric: Normal judgment and insight. Alert and oriented x 3. Normal mood.    Labs on Admission: I have personally reviewed following labs and imaging studies  CBC: Recent Labs  Lab 10/29/20 1428 10/29/20 1629  WBC 15.3*  --   NEUTROABS 12.1*  --   HGB 12.0 10.5*  HCT 38.1 31.0*  MCV 89.6  --   PLT 197  --    Basic Metabolic Panel: Recent Labs  Lab 10/29/20 1428 10/29/20 1629  NA 133* 133*  K 5.1 4.9  CL 103 106  CO2 18*  --   GLUCOSE 105* 97  BUN 24* 23  CREATININE 1.22* 1.00  CALCIUM 9.0  --    GFR: CrCl cannot be calculated (Unknown ideal weight.). Liver Function Tests: Recent Labs  Lab 10/29/20 1428  AST 22  ALT 19  ALKPHOS 76  BILITOT 0.9  PROT 6.7  ALBUMIN 2.9*   No results for input(s): LIPASE, AMYLASE in the last 168 hours. No results for input(s): AMMONIA in the last 168 hours. Coagulation Profile: Recent Labs  Lab 10/29/20 1428  INR 1.2   Cardiac Enzymes: No results for input(s): CKTOTAL, CKMB, CKMBINDEX, TROPONINI in the last 168 hours. BNP (last 3 results) No results for input(s): PROBNP in the last 8760 hours. HbA1C: No results for input(s): HGBA1C in the last 72 hours. CBG: No results for input(s): GLUCAP  in the last 168 hours. Lipid Profile: No results for input(s): CHOL, HDL, LDLCALC, TRIG, CHOLHDL, LDLDIRECT in the last 72 hours. Thyroid Function Tests: No results for input(s): TSH, T4TOTAL, FREET4, T3FREE, THYROIDAB in the last 72 hours. Anemia Panel: No results for input(s): VITAMINB12, FOLATE, FERRITIN, TIBC, IRON, RETICCTPCT in the last 72 hours. Urine analysis:    Component Value Date/Time   COLORURINE  AMBER (A) 10/29/2020 1422   APPEARANCEUR HAZY (A) 10/29/2020 1422   LABSPEC 1.020 10/29/2020 1422   PHURINE 5.0 10/29/2020 1422   GLUCOSEU NEGATIVE 10/29/2020 1422   HGBUR MODERATE (A) 10/29/2020 1422   BILIRUBINUR NEGATIVE 10/29/2020 1422   KETONESUR 20 (A) 10/29/2020 1422   PROTEINUR 30 (A) 10/29/2020 1422   UROBILINOGEN 0.2 09/14/2014 1637   NITRITE NEGATIVE 10/29/2020 1422   LEUKOCYTESUR MODERATE (A) 10/29/2020 1422    Radiological Exams on Admission: CT ABDOMEN PELVIS W CONTRAST  Result Date: 10/29/2020 CLINICAL DATA:  Abdominal abscess or infection. EXAM: CT ABDOMEN AND PELVIS WITH CONTRAST TECHNIQUE: Multidetector CT imaging of the abdomen and pelvis was performed using the standard protocol following bolus administration of intravenous contrast. CONTRAST:  47mL OMNIPAQUE IOHEXOL 300 MG/ML  SOLN COMPARISON:  Jun 10, 2017. FINDINGS: Lower chest: No acute abnormality. Hepatobiliary: No focal liver abnormality is seen. Status post cholecystectomy. No biliary dilatation. Pancreas: Unremarkable. No pancreatic ductal dilatation or surrounding inflammatory changes. Spleen: Normal in size without focal abnormality. Adrenals/Urinary Tract: Adrenal glands appear normal. Right renal cyst is noted. No hydronephrosis or renal obstruction is noted. Urinary bladder is unremarkable. Stomach/Bowel: Stomach is within normal limits. Appendix appears normal. No evidence of bowel wall thickening, distention, or inflammatory changes. Vascular/Lymphatic: Aortic atherosclerosis. No enlarged abdominal or pelvic lymph nodes. Reproductive: Status post hysterectomy. No adnexal masses. Other: 2 moderate size fat containing periumbilical hernias are noted. No ascites is noted. 6.1 x 3.3 cm fluid collection is seen in the inferior portion of the right buttocks region inferior to right ischial tuberosity. Musculoskeletal: No acute or significant osseous findings. IMPRESSION: 6.1 x 3.3 cm fluid collection is seen in the  inferior portion of the right buttocks region inferior to right ischial tuberosity. This was present on prior exam. Potentially this may represent abscess. Two moderate size fat containing periumbilical hernias are noted. Aortic Atherosclerosis (ICD10-I70.0). Electronically Signed   By: Lupita Raider M.D.   On: 10/29/2020 16:40   DG Chest Port 1 View  Result Date: 10/29/2020 CLINICAL DATA:  Possible sepsis, dehydration. EXAM: PORTABLE CHEST 1 VIEW COMPARISON:  Jun 06, 2017. FINDINGS: The heart size and mediastinal contours are within normal limits. Both lungs are clear. The visualized skeletal structures are unremarkable. IMPRESSION: No active disease. Electronically Signed   By: Lupita Raider M.D.   On: 10/29/2020 15:10    EKG: Independently reviewed.  A. fib with RVR  Assessment/Plan Principal Problem:   Severe sepsis (HCC) Active Problems:   Adrenal insufficiency (HCC)   PAF (paroxysmal atrial fibrillation) (HCC)   Psoriatic arthritis (HCC)   Iron deficiency anemia- transfused this admission   Depression   Peripheral neuropathy   Obesity (BMI 30-39.9)- suspected sleep apnea, declines sleep study   SAH (subarachnoid hemorrhage) (HCC)   Essential hypertension   GERD (gastroesophageal reflux disease)  Severe sepsis secondary to UTI: Patient presented with tachycardia, tachypnea, hypotension, infection source UTI Lactic acid 1.2, patient has received 3 L of IV fluids per sepsis protocol Patient is started on aztreonam and vancomycin. Pharmacy consulted to continue Aztreonam. CXR : No acute  infilterate Follow-up urine and blood cultures.  Right gluteal Abscess: There is right gluteal abscess on CT scan, present from prior exam. There is no physical swelling.  A. fib with RVR: Patient does have a history of paroxysmal A. fib, EKG: A. Fib with RVR Will continue home medications once medication reconciliation complete. Patient is not on any anticoagulation could be due to  history of subarachnoid hemorrhage. Obtain 2D echocardiogram. Cardiology consulted for A. fib with RVR with hypotension.  Essential hypertension: Will hold blood pressure medications and blood pressure is on soft side.  Generalized weakness : this could be from dehydration decreased p.o. intake for last 3 days PT/OT  Depression: Continue home medications  Peripheral neuropathy: Continue gabapentin  GERD : Continue Protonix.   DVT prophylaxis: SCDs Code Status: Full code Family Communication: Daughter at bedside Disposition Plan:   Status is: Inpatient  Remains inpatient appropriate because:Inpatient level of care appropriate due to severity of illness  Dispo: The patient is from: Home              Anticipated d/c is to: Home              Patient currently is not medically stable to d/c.   Difficult to place patient No   Consults called: Cardiology Admission status: Inpatient   Cipriano Bunker MD [facility]  If 7PM-7AM, please contact night-coverage   10/29/2020, 7:05 PM

## 2020-10-29 NOTE — ED Notes (Signed)
Pt requesting some ice - Floor coverage paged and asked for diet order

## 2020-10-29 NOTE — Consult Note (Signed)
Cardiology Consultation:   Patient ID: Andrea Yu MRN: [id number]; DOB: [birth year]  Admit date: 10/29/2020 Date of Consult: 10/29/2020  PCP:  Jarome Matin, MD   [facility] Providers Cardiologist:  Thurmon Fair, MD   Patient Profile:   Andrea Yu is a 80 y.o. female with a hx of atrail fibrllation who is being seen 10/29/2020 for the evaluation of afib with RVR  at the request of Dr Lucianne Muss.  History of Present Illness:   Andrea Yu is an 80 yo with hx of PAF, HTN, orthostatic hypotension, peripheral neuropathy (leading to frequent falls including traumatic SAH), stasis ulcers of buttocks, GERD, obesity, anemia  She was last seen by M Croituru in Nov 2021.   She was in afib at the time .   Last echo showed LVEF 50 to 55% (March 2019)   The pt presented to ED with increased urinary frequency and decreased po intake  She still had symptoms after course of PO bactrim IN ED she was tachycardic in atrial fibrillation and mildly hypotensive.  CT done showed possible abscess in buttock  The pt denies CP   Breathing is OK   Not dizzy   No SOB   Comfortable laying in bed   Past Medical History:  Diagnosis Date   A-fib [facility])    "just after knee 2nd OR" (11/07/2012)   Anemia    Arthritis    RA , SACROTIC ARTHRITIS   Complication of anesthesia    found to be in afib when she woke up   Dysrhythmia    GERD (gastroesophageal reflux disease)    Head injury, closed, with concussion    History of blood transfusion    "after 1 of my knee ORs and today" (11/07/2012)   Hypertension    Peripheral neuropathy    "from back OR in 2005; in hands and feet" (11/07/2012)   Psoriatic arthritis (HCC)    on Prednisone Rx    Past Surgical History:  Procedure Laterality Date   ANKLE FUSION Right 01/07/2016   Procedure: Right Tibiocalcaneal Fusion;  Surgeon: Nadara Mustard, MD;  Location: [facility] OR;  Service: Orthopedics;  Laterality: Right;   BACK SURGERY     CATARACT EXTRACTION W/  INTRAOCULAR LENS  IMPLANT, BILATERAL Bilateral 2007   CHOLECYSTECTOMY  2000's   COLONOSCOPY N/A 11/10/2012   Procedure: COLONOSCOPY;  Surgeon: Theda Belfast, MD;  Location: [facility] ENDOSCOPY;  Service: Endoscopy;  Laterality: N/A;   ESOPHAGOGASTRODUODENOSCOPY N/A 11/10/2012   Procedure: ESOPHAGOGASTRODUODENOSCOPY (EGD);  Surgeon: Theda Belfast, MD;  Location: [facility] ENDOSCOPY;  Service: Endoscopy;  Laterality: N/A;   EYE SURGERY     JOINT REPLACEMENT     POSTERIOR LUMBAR FUSION  2005   "got a rod and 4 pins in there" (11/07/2012)   REPLACEMENT TOTAL KNEE BILATERAL Bilateral 2007   TRANSTHORACIC ECHOCARDIOGRAM  05/24/2006   TUBAL LIGATION  1974   VAGINAL HYSTERECTOMY  1983       Inpatient Medications: Scheduled Meds:  docusate sodium  100 mg Oral BID   enoxaparin (LOVENOX) injection  30 mg Subcutaneous Q24H   ertapenem  1 g Intravenous Q24H   Continuous Infusions:  lactated ringers     vancomycin 1,250 mg (10/29/20 1644)   PRN Meds:  Allergies:    Allergies  Allergen Reactions   Penicillins Anaphylaxis    THROAT SWELLING  Has patient had a PCN reaction causing immediate rash, facial/tongue/throat swelling, SOB or lightheadedness with hypotension:  # # YES # #  Has patient had a PCN reaction causing severe rash involving mucus membranes or skin necrosis: # # # YES # # # Has patient had a PCN reaction that required hospitalization:No Has patient had a PCN reaction occurring within the last 10 years:  # # YES # #  If all of the above answers are "NO", then may proceed with Cephalosporin use.    Prolia [Denosumab] Other (See Comments)    Severe Pain in the groin area.   Ceftriaxone     Possible cause of neutropenia   Ciprofloxacin Hcl Other (See Comments)   Fosamax [Alendronate Sodium]     UNSPECIFIED REACTION    Latex Swelling   Lisinopril Cough   Tubersol [Tuberculin Ppd] Other (See Comments)    Unknown     Social History:   Social History   Socioeconomic History    Marital status: Married    Spouse name: Not on file   Number of children: Not on file   Years of education: Not on file   Highest education level: Not on file  Occupational History   Not on file  Tobacco Use   Smoking status: Former    Packs/day: 0.50    Years: 30.00    Pack years: 15.00    Types: Cigarettes    Quit date: 08/09/1981    Years since quitting: 39.2   Smokeless tobacco: Never  Substance and Sexual Activity   Alcohol use: No   Drug use: No   Sexual activity: Not Currently  Other Topics Concern   Not on file  Social History Narrative   Not on file   Social Determinants of Health   Financial Resource Strain: Not on file  Food Insecurity: Not on file  Transportation Needs: Not on file  Physical Activity: Not on file  Stress: Not on file  Social Connections: Not on file  Intimate Partner Violence: Not on file    Family History:    Family History  Problem Relation Age of Onset   Heart attack Father        1951-deceased    Liver disease Mother    Aneurysm Son 95       Deceased      ROS:  Please see the history of present illness.   All other ROS reviewed and negative.     Physical Exam/Data:   Vitals:   10/29/20 1615 10/29/20 1645 10/29/20 1700 10/29/20 1742  BP: 99/67 107/68 (!) 104/53 120/76  Pulse: (!) 108 (!) 120 (!) 113 (!) 111  Resp: 17 11 13  (!) 24  Temp:      TempSrc:      SpO2: 99% 99% 99% 99%    Intake/Output Summary (Last 24 hours) at 10/29/2020 1804 Last data filed at 10/29/2020 1617 Gross per 24 hour  Intake 2099.69 ml  Output --  Net 2099.69 ml   Last 3 Weights 12/13/2019 05/25/2018 12/08/2017  Weight (lbs) 183 lb 144 lb 148 lb  Weight (kg) 83.008 kg 65.318 kg 67.132 kg     There is no height or weight on file to calculate BMI.  General:  Obese 80 yo in no acute distress HEENT: normal Neck: no does not appear to be elevated    Vascular: No carotid bruits; Distal pulses 2+ bilaterally Cardiac:  normal S1,S2.  Irreg irreg    no S3  No murmurs   Lungs:  clear to auscultation bilaterally, no wheezing, rhonchi or rales  Abd: soft, nontender, no hepatomegaly  Ext: no edema  Musculoskeletal:  No deformities, BUE and BLE strength normal and equal Skin: warm and dry  Neuro:  CNs 2-12 intact, no focal abnormalities noted Psych:  Normal affect   EKG:  The EKG was personally reviewed and demonstrates:  Atrail fibrillation   148 bpm  Telemetry:  Telemetry was personally reviewed and demonstrates:  Afib  Currently 110s  Relevant CV Studies: Echo 04/19/17  - Left ventricle: The cavity size was normal. Wall thickness was    normal. Systolic function was normal. The estimated ejection    fraction was in the range of 50% to 55%. Wall motion was normal;    there were no regional wall motion abnormalities.  - Aortic valve: There was mild regurgitation.  - Left atrium: The atrium was moderately dilated.  - Pulmonary arteries: Systolic pressure was mildly increased. PA    peak pressure: 40 mm Hg (S).   Impressions:   - Normal LV systolic function; mild AI; moderate LAE; mild TR; mild    pulmonary hypertension.   Laboratory Data:  High Sensitivity Troponin:  No results for input(s): TROPONINIHS in the last 720 hours.   Chemistry Recent Labs  Lab 10/29/20 1428 10/29/20 1629  NA 133* 133*  K 5.1 4.9  CL 103 106  CO2 18*  --   GLUCOSE 105* 97  BUN 24* 23  CREATININE 1.22* 1.00  CALCIUM 9.0  --   GFRNONAA 45*  --   ANIONGAP 12  --     Recent Labs  Lab 10/29/20 1428  PROT 6.7  ALBUMIN 2.9*  AST 22  ALT 19  ALKPHOS 76  BILITOT 0.9   Lipids No results for input(s): CHOL, TRIG, HDL, LABVLDL, LDLCALC, CHOLHDL in the last 168 hours.  Hematology Recent Labs  Lab 10/29/20 1428 10/29/20 1629  WBC 15.3*  --   RBC 4.25  --   HGB 12.0 10.5*  HCT 38.1 31.0*  MCV 89.6  --   MCH 28.2  --   MCHC 31.5  --   RDW 15.4  --   PLT 197  --    Thyroid No results for input(s): TSH, FREET4 in the last 168 hours.   BNPNo results for input(s): BNP, PROBNP in the last 168 hours.  DDimer No results for input(s): DDIMER in the last 168 hours.   Radiology/Studies:  CT ABDOMEN PELVIS W CONTRAST  Result Date: 10/29/2020 CLINICAL DATA:  Abdominal abscess or infection. EXAM: CT ABDOMEN AND PELVIS WITH CONTRAST TECHNIQUE: Multidetector CT imaging of the abdomen and pelvis was performed using the standard protocol following bolus administration of intravenous contrast. CONTRAST:  44mL OMNIPAQUE IOHEXOL 300 MG/ML  SOLN COMPARISON:  Jun 10, 2017. FINDINGS: Lower chest: No acute abnormality. Hepatobiliary: No focal liver abnormality is seen. Status post cholecystectomy. No biliary dilatation. Pancreas: Unremarkable. No pancreatic ductal dilatation or surrounding inflammatory changes. Spleen: Normal in size without focal abnormality. Adrenals/Urinary Tract: Adrenal glands appear normal. Right renal cyst is noted. No hydronephrosis or renal obstruction is noted. Urinary bladder is unremarkable. Stomach/Bowel: Stomach is within normal limits. Appendix appears normal. No evidence of bowel wall thickening, distention, or inflammatory changes. Vascular/Lymphatic: Aortic atherosclerosis. No enlarged abdominal or pelvic lymph nodes. Reproductive: Status post hysterectomy. No adnexal masses. Other: 2 moderate size fat containing periumbilical hernias are noted. No ascites is noted. 6.1 x 3.3 cm fluid collection is seen in the inferior portion of the right buttocks region inferior to right ischial tuberosity. Musculoskeletal: No acute or significant osseous findings. IMPRESSION: 6.1 x 3.3 cm  fluid collection is seen in the inferior portion of the right buttocks region inferior to right ischial tuberosity. This was present on prior exam. Potentially this may represent abscess. Two moderate size fat containing periumbilical hernias are noted. Aortic Atherosclerosis (ICD10-I70.0). Electronically Signed   By: Lupita Raider M.D.   On:  10/29/2020 16:40   DG Chest Port 1 View  Result Date: 10/29/2020 CLINICAL DATA:  Possible sepsis, dehydration. EXAM: PORTABLE CHEST 1 VIEW COMPARISON:  Jun 06, 2017. FINDINGS: The heart size and mediastinal contours are within normal limits. Both lungs are clear. The visualized skeletal structures are unremarkable. IMPRESSION: No active disease. Electronically Signed   By: Lupita Raider M.D.   On: 10/29/2020 15:10     Assessment and Plan:   Atrial fib with RVR  Patient with longstanding atrial fibrillation   She does not sense   Presents today with tachycardia   most likely due to systemic infection /sepsis.   Agree with hydration      Rates may improve.   Follow   Hold rate controlling meds right now and watch BP   She is tolerating rates OK     Due to frequent falls, one resulting in [facility] not feltto be a candidate for anticoagulation.    2  HTN  Hx of    Follow as treat infectoin  Hold Ca and B blocker    3    ID   Pt recently Rx for UTI   CT scan shows possible abscess of buttock   On ABX      For questions or updates, please contact [facility] Please consult [email or web address] for contact info under    Signed, Dietrich Pates, MD  10/29/2020 6:04 PM

## 2020-10-29 NOTE — Progress Notes (Addendum)
Pharmacy Antibiotic Note  Andrea Yu is a 80 y.o. female admitted on 10/29/2020 with sepsis.  Pharmacy has been consulted for vancomycin and aztreonam dosing.  Patient has history of anaphylaxis with penicillin and neutropenia with ceftriaxone. No other cephalosporins in records.   Recent hx of UTI treated with Bactrim, but patient is dehydrated and has not had an appetite and has has some slight confusion for past few days. Sent to ED from PCP due to leukocytosis.   Afebrile with WBC 15.3. Last documented wt 160lbs in 08/2020  Plan: Vancomycin 1250 mg IV LD once Aztreonam 2 g IV q8h Metronidazole 500 mg IV once Follow cultures, labs, and clinical improvement  Maintenance dose vancomycin 1000 mg IV Q 48 hrs. Goal AUC 400-550. Expected AUC: 468.3, Cmin 9.2 SCr used: 1.0, Wt used: 72.7 kg, Vd 0.5 MD orders not yet entered    Temp (24hrs), Avg:98.7 F (37.1 C), Min:98.7 F (37.1 C), Max:98.7 F (37.1 C)  No results for input(s): WBC, CREATININE, LATICACIDVEN, VANCOTROUGH, VANCOPEAK, VANCORANDOM, GENTTROUGH, GENTPEAK, GENTRANDOM, TOBRATROUGH, TOBRAPEAK, TOBRARND, AMIKACINPEAK, AMIKACINTROU, AMIKACIN in the last 168 hours.  CrCl cannot be calculated (Patient's most recent lab result is older than the maximum 21 days allowed.).    Allergies  Allergen Reactions   Penicillins Anaphylaxis    THROAT SWELLING  Has patient had a PCN reaction causing immediate rash, facial/tongue/throat swelling, SOB or lightheadedness with hypotension:  # # YES # #  Has patient had a PCN reaction causing severe rash involving mucus membranes or skin necrosis: # # # YES # # # Has patient had a PCN reaction that required hospitalization:No Has patient had a PCN reaction occurring within the last 10 years:  # # YES # #  If all of the above answers are "NO", then may proceed with Cephalosporin use.    Prolia [Denosumab] Other (See Comments)    Severe Pain in the groin area.   Ceftriaxone     Possible  cause of neutropenia   Ciprofloxacin Hcl Other (See Comments)   Fosamax [Alendronate Sodium]     UNSPECIFIED REACTION    Tubersol [Tuberculin Ppd] Other (See Comments)    Unknown     Antimicrobials this admission: Vancomycin 10/5>> Aztreonam 10/5>> Metronidazole 10/5 x 1 dose  Microbiology results: 10/5 BCx: in process 10/5 UCx: in process   Thank you for allowing pharmacy to be a part of this patient's care.  Georgeann Oppenheim 10/29/2020 3:15 PM

## 2020-10-29 NOTE — ED Notes (Signed)
Replaced purewick

## 2020-10-30 DIAGNOSIS — R6520 Severe sepsis without septic shock: Secondary | ICD-10-CM | POA: Diagnosis not present

## 2020-10-30 DIAGNOSIS — A419 Sepsis, unspecified organism: Secondary | ICD-10-CM

## 2020-10-30 LAB — COMPREHENSIVE METABOLIC PANEL
ALT: 18 U/L (ref 0–44)
AST: 21 U/L (ref 15–41)
Albumin: 2.4 g/dL — ABNORMAL LOW (ref 3.5–5.0)
Alkaline Phosphatase: 61 U/L (ref 38–126)
Anion gap: 13 (ref 5–15)
BUN: 14 mg/dL (ref 8–23)
CO2: 20 mmol/L — ABNORMAL LOW (ref 22–32)
Calcium: 8.3 mg/dL — ABNORMAL LOW (ref 8.9–10.3)
Chloride: 100 mmol/L (ref 98–111)
Creatinine, Ser: 1.06 mg/dL — ABNORMAL HIGH (ref 0.44–1.00)
GFR, Estimated: 53 mL/min — ABNORMAL LOW (ref >60)
Glucose, Bld: 82 mg/dL (ref 70–99)
Potassium: 3.2 mmol/L — ABNORMAL LOW (ref 3.5–5.1)
Sodium: 133 mmol/L — ABNORMAL LOW (ref 135–145)
Total Bilirubin: 1 mg/dL (ref 0.3–1.2)
Total Protein: 5.2 g/dL — ABNORMAL LOW (ref 6.5–8.1)

## 2020-10-30 LAB — URINE CULTURE

## 2020-10-30 LAB — CORTISOL-AM, BLOOD: Cortisol - AM: 14.6 ug/dL (ref 6.7–22.6)

## 2020-10-30 LAB — PROTIME-INR
INR: 1.2 (ref 0.8–1.2)
Prothrombin Time: 15.4 seconds — ABNORMAL HIGH (ref 11.4–15.2)

## 2020-10-30 LAB — PROCALCITONIN: Procalcitonin: 1.2 ng/mL

## 2020-10-30 MED ORDER — DILTIAZEM HCL 25 MG/5ML IV SOLN
10.0000 mg | Freq: Once | INTRAVENOUS | Status: AC
  Administered 2020-10-30: 10 mg via INTRAVENOUS
  Filled 2020-10-30: qty 5

## 2020-10-30 MED ORDER — LACTATED RINGERS IV SOLN: INTRAVENOUS | Status: AC

## 2020-10-30 MED ORDER — POTASSIUM CHLORIDE 10 MEQ/100ML IV SOLN
10.0000 meq | INTRAVENOUS | Status: DC
Start: 2020-10-30 — End: 2020-10-30
  Administered 2020-10-30 (×2): 10 meq via INTRAVENOUS
  Filled 2020-10-30 (×2): qty 100

## 2020-10-30 MED ORDER — MELATONIN 3 MG PO TABS
3.0000 mg | ORAL_TABLET | Freq: Once | ORAL | Status: AC
  Administered 2020-10-30: 3 mg via ORAL

## 2020-10-30 MED ORDER — DILTIAZEM LOAD VIA INFUSION
10.0000 mg | Freq: Once | INTRAVENOUS | Status: AC
  Administered 2020-10-30: 10 mg via INTRAVENOUS
  Filled 2020-10-30: qty 10

## 2020-10-30 MED ORDER — LORAZEPAM 2 MG/ML IJ SOLN
1.0000 mg | Freq: Once | INTRAMUSCULAR | Status: DC
  Filled 2020-10-30: qty 1

## 2020-10-30 MED ORDER — SODIUM CHLORIDE 0.9 % IV SOLN
1.0000 g | Freq: Three times a day (TID) | INTRAVENOUS | Status: DC
  Administered 2020-10-30 – 2020-10-31 (×2): 1 g via INTRAVENOUS
  Filled 2020-10-30 (×4): qty 1

## 2020-10-30 MED ORDER — MELATONIN 3 MG PO TABS
3.0000 mg | ORAL_TABLET | Freq: Every day | ORAL | Status: DC
  Filled 2020-10-30: qty 1

## 2020-10-30 MED ORDER — POTASSIUM CHLORIDE 10 MEQ/100ML IV SOLN
10.0000 meq | INTRAVENOUS | Status: AC
  Administered 2020-10-30: 10 meq via INTRAVENOUS
  Filled 2020-10-30: qty 100

## 2020-10-30 MED ORDER — DILTIAZEM HCL-DEXTROSE 125-5 MG/125ML-% IV SOLN (PREMIX)
5.0000 mg/h | INTRAVENOUS | Status: DC
Start: 2020-10-30 — End: 2020-10-31
  Administered 2020-10-30: 5 mg/h via INTRAVENOUS
  Filled 2020-10-30: qty 125

## 2020-10-30 MED ORDER — VANCOMYCIN HCL IN DEXTROSE 1-5 GM/200ML-% IV SOLN: 1000.0000 mg | INTRAVENOUS | Status: DC

## 2020-10-30 MED ORDER — IBUPROFEN 600 MG PO TABS
600.0000 mg | ORAL_TABLET | Freq: Four times a day (QID) | ORAL | Status: DC | PRN
  Administered 2020-10-30: 600 mg via ORAL
  Filled 2020-10-30: qty 1

## 2020-10-30 MED ORDER — SODIUM CHLORIDE 0.9 % IV BOLUS
500.0000 mL | Freq: Once | INTRAVENOUS | Status: AC
  Administered 2020-10-30: 500 mL via INTRAVENOUS

## 2020-10-30 NOTE — Consult Note (Signed)
[facility]  Total days of antibiotics 2               Reason for Consult:sepsis secondary to UTI, right gluteal abscess    Referring Physician: Alba Cory, MD  Principal Problem:   Severe sepsis (HCC) Active Problems:   Adrenal insufficiency (HCC)   PAF (paroxysmal atrial fibrillation) (HCC)   Psoriatic arthritis (HCC)   Iron deficiency anemia- transfused this admission   Depression   Peripheral neuropathy   Obesity (BMI 30-39.9)- suspected sleep apnea, declines sleep study   SAH (subarachnoid hemorrhage) (HCC)   Essential hypertension   GERD (gastroesophageal reflux disease)    HPI: Andrea Yu is a 80 y.o. female with Afib, HTN, and psoriatic arthritis who presented to [facility] after her PCP found her to have a leukocytosis on CBC. The patient states that she was diagnosed with a UTI and was prescribed Bactrim for this. She is no longer having any dysuria since being treated with antibiotics. 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 She denies any drainage at the site, or any surrounding redness or swelling. She endorses subjective fevers and chills, but denies any chest pain, shortness of breath, abd pain, vomiting, diarrhea, dysuria, or hematuria. She does endorse some nausea and decrease in PO intake over the last few days.   Past Medical History:  Diagnosis Date   A-fib [facility])    "just after knee 2nd OR" (11/07/2012)   Anemia    Arthritis    RA , SACROTIC ARTHRITIS   Complication of anesthesia    found to be in afib when she woke up   Dysrhythmia    GERD (gastroesophageal reflux disease)    Head injury, closed, with concussion    History of blood transfusion    "after 1 of my knee ORs and today" (11/07/2012)   Hypertension    Peripheral neuropathy    "from back OR in 2005; in hands and feet" (11/07/2012)   Psoriatic arthritis  (HCC)    on Prednisone Rx    Allergies:  Allergies  Allergen Reactions   Penicillins Anaphylaxis    THROAT SWELLING  Has patient had a PCN reaction causing immediate rash, facial/tongue/throat swelling, SOB or lightheadedness with hypotension:  # # YES # #  Has patient had a PCN reaction causing severe rash involving mucus membranes or skin necrosis: # # # YES # # # Has patient had a PCN reaction that required hospitalization:No Has patient had a PCN reaction occurring within the last 10 years:  # # YES # #  If all of the above answers are "NO", then may proceed with Cephalosporin use.    Prolia [Denosumab] Other (See Comments)    Severe Pain in the groin area.   Ceftriaxone     Possible cause of neutropenia   Ciprofloxacin Hcl Other (See Comments)   Fosamax [Alendronate Sodium]     UNSPECIFIED REACTION    Latex Swelling   Lisinopril Cough   Tubersol [Tuberculin Ppd] Other (See Comments)    Unknown     Current antibiotics: Vanc and aztreonam   MEDICATIONS:  aspirin EC  81 mg Oral QPM   diltiazem  120 mg Oral Daily   docusate sodium  100 mg Oral BID   DULoxetine  90 mg Oral Daily   enoxaparin (LOVENOX) injection  30 mg Subcutaneous Q24H  ertapenem  1 g Intravenous Q24H   LORazepam  1 mg Intravenous Once   pantoprazole  40 mg Oral Daily    Social History   Tobacco Use   Smoking status: Former    Packs/day: 0.50    Years: 30.00    Pack years: 15.00    Types: Cigarettes    Quit date: 08/09/1981    Years since quitting: 39.2   Smokeless tobacco: Never  Substance Use Topics   Alcohol use: No   Drug use: No    Family History  Problem Relation Age of Onset   Heart attack Father        1951-deceased    Liver disease Mother    Aneurysm Son 56       Deceased     Review of Systems - Review of Systems  Constitutional:  Positive for chills and fever.  HENT: Negative.    Respiratory:  Negative for cough and shortness of breath.   Cardiovascular:  Negative for  chest pain and palpitations.  Gastrointestinal:  Positive for nausea. Negative for abdominal pain, blood in stool, diarrhea, melena and vomiting.  Genitourinary:  Positive for frequency. Negative for dysuria, hematuria and urgency.  Musculoskeletal: Negative.   Neurological:  Negative for dizziness and headaches.     OBJECTIVE: Temp:  [98.5 F (36.9 C)-103.2 F (39.6 C)] 99.9 F (37.7 C) (10/06 0536) Pulse Rate:  [46-143] 85 (10/06 1023) Resp:  [11-26] 16 (10/06 1023) BP: (92-152)/(40-103) 143/77 (10/06 1023) SpO2:  [94 %-100 %] 100 % (10/06 1023) Weight:  [83 kg] 83 kg (10/06 0700)  General: Pleasant, well-appearing elderly female laying in bed. No acute distress. Head: Normocephalic. Atraumatic. CV: RRR. No murmurs, rubs, or gallops. No LE edema Pulmonary: Lungs CTAB. Normal effort. No wheezing or rales. Abdominal: Soft, nontender, nondistended. Normal bowel sounds. Extremities: Palpable radial and DP pulses. Normal ROM. Skin: Warm and dry. No obvious rash or lesions. Neuro: A&Ox3.    LABS: Results for orders placed or performed during the hospital encounter of 10/29/20 (from the past 48 hour(s))  Blood Culture (routine x 2)     Status: None (Preliminary result)   Collection Time: 10/29/20 10:45 AM   Specimen: BLOOD  Result Value Ref Range   Specimen Description BLOOD BLOOD RIGHT FOREARM    Special Requests      BOTTLES DRAWN AEROBIC AND ANAEROBIC Blood Culture adequate volume   Culture      NO GROWTH < 24 HOURS Performed at [facility], [address], [place], [place] [address]    Report Status PENDING   Resp Panel by RT-PCR (Flu A&B, Covid)     Status: None   Collection Time: 10/29/20  2:22 PM   Specimen: Nasopharyngeal(NP) swabs in vial transport medium  Result Value Ref Range   SARS Coronavirus 2 by RT PCR NEGATIVE NEGATIVE    Comment: (NOTE) SARS-CoV-2 target nucleic acids are NOT DETECTED.  The SARS-CoV-2 RNA is generally detectable in upper  respiratory specimens during the acute phase of infection. The lowest concentration of SARS-CoV-2 viral copies this assay can detect is 138 copies/mL. A negative result does not preclude SARS-Cov-2 infection and should not be used as the sole basis for treatment or other patient management decisions. A negative result may occur with  improper specimen collection/handling, submission of specimen other than nasopharyngeal swab, presence of viral mutation(s) within the areas targeted by this assay, and inadequate number of viral copies(<138 copies/mL). A negative result must be combined with  clinical observations, patient history, and epidemiological information. The expected result is Negative.  Fact Sheet for Patients:  BloggerCourse.com  Fact Sheet for Healthcare Providers:  SeriousBroker.it  This test is no t yet approved or cleared by the [place] FDA and  has been authorized for detection and/or diagnosis of SARS-CoV-2 by FDA under an Emergency Use Authorization (EUA). This EUA will remain  in effect (meaning this test can be used) for the duration of the COVID-19 declaration under Section 564(b)(1) of the Act, 21 U.S.C.section 360bbb-3(b)(1), unless the authorization is terminated  or revoked sooner.       Influenza A by PCR NEGATIVE NEGATIVE   Influenza B by PCR NEGATIVE NEGATIVE    Comment: (NOTE) The Xpert Xpress SARS-CoV-2/FLU/RSV plus assay is intended as an aid in the diagnosis of influenza from Nasopharyngeal swab specimens and should not be used as a sole basis for treatment. Nasal washings and aspirates are unacceptable for Xpert Xpress SARS-CoV-2/FLU/RSV testing.  Fact Sheet for Patients: BloggerCourse.com  Fact Sheet for Healthcare Providers: SeriousBroker.it  This test is not yet approved or cleared by the [place] FDA and has been authorized for  detection and/or diagnosis of SARS-CoV-2 by FDA under an Emergency Use Authorization (EUA). This EUA will remain in effect (meaning this test can be used) for the duration of the COVID-19 declaration under Section 564(b)(1) of the Act, 21 U.S.C. section 360bbb-3(b)(1), unless the authorization is terminated or revoked.  Performed at [facility], [address], [place], [place] [address]   Urinalysis, Routine w reflex microscopic     Status: Abnormal   Collection Time: 10/29/20  2:22 PM  Result Value Ref Range   Color, Urine AMBER (A) YELLOW    Comment: BIOCHEMICALS MAY BE AFFECTED BY COLOR   APPearance HAZY (A) CLEAR   Specific Gravity, Urine 1.020 1.005 - 1.030   pH 5.0 5.0 - 8.0   Glucose, UA NEGATIVE NEGATIVE mg/dL   Hgb urine dipstick MODERATE (A) NEGATIVE   Bilirubin Urine NEGATIVE NEGATIVE   Ketones, ur 20 (A) NEGATIVE mg/dL   Protein, ur 30 (A) NEGATIVE mg/dL   Nitrite NEGATIVE NEGATIVE   Leukocytes,Ua MODERATE (A) NEGATIVE   RBC / HPF 6-10 0 - 5 RBC/hpf   WBC, UA 21-50 0 - 5 WBC/hpf   Bacteria, UA RARE (A) NONE SEEN   Squamous Epithelial / LPF 0-5 0 - 5   Mucus PRESENT     Comment: Performed at [facility], [address], [place], [place] [address]  Lactic acid, plasma     Status: None   Collection Time: 10/29/20  2:28 PM  Result Value Ref Range   Lactic Acid, Venous 1.0 0.5 - 1.9 mmol/L    Comment: Performed at [facility], [address], [place], [place] [address]  Comprehensive metabolic panel     Status: Abnormal   Collection Time: 10/29/20  2:28 PM  Result Value Ref Range   Sodium 133 (L) 135 - 145 mmol/L   Potassium 5.1 3.5 - 5.1 mmol/L   Chloride 103 98 - 111 mmol/L   CO2 18 (L) 22 - 32 mmol/L   Glucose, Bld 105 (H) 70 - 99 mg/dL    Comment: Glucose reference range applies only to samples taken after fasting for at least 8 hours.   BUN 24 (H) 8 - 23 mg/dL   Creatinine, Ser 2.87 (H) 0.44 - 1.00 mg/dL   Calcium 9.0 8.9 - 68.1 mg/dL  Total Protein 6.7 6.5 - 8.1 g/dL   Albumin 2.9 (L) 3.5 - 5.0 g/dL   AST 22 15 - 41 U/L   ALT 19 0 - 44 U/L   Alkaline Phosphatase 76 38 - 126 U/L   Total Bilirubin 0.9 0.3 - 1.2 mg/dL   GFR, Estimated 45 (L) >60 mL/min    Comment: (NOTE) Calculated using the CKD-EPI Creatinine Equation (2021)    Anion gap 12 5 - 15    Comment: Performed at [facility], [address] [place], [place] [address]  CBC WITH DIFFERENTIAL     Status: Abnormal   Collection Time: 10/29/20  2:28 PM  Result Value Ref Range   WBC 15.3 (H) 4.0 - 10.5 K/uL   RBC 4.25 3.87 - 5.11 MIL/uL   Hemoglobin 12.0 12.0 - 15.0 g/dL   HCT 47.8 29.5 - 62.1 %   MCV 89.6 80.0 - 100.0 fL   MCH 28.2 26.0 - 34.0 pg   MCHC 31.5 30.0 - 36.0 g/dL   RDW 30.8 65.7 - 84.6 %   Platelets 197 150 - 400 K/uL   nRBC 0.0 0.0 - 0.2 %   Neutrophils Relative % 77 %   Neutro Abs 12.1 (H) 1.7 - 7.7 K/uL   Lymphocytes Relative 13 %   Lymphs Abs 1.9 0.7 - 4.0 K/uL   Monocytes Relative 8 %   Monocytes Absolute 1.2 (H) 0.1 - 1.0 K/uL   Eosinophils Relative 0 %   Eosinophils Absolute 0.0 0.0 - 0.5 K/uL   Basophils Relative 1 %   Basophils Absolute 0.1 0.0 - 0.1 K/uL   Immature Granulocytes 1 %   Abs Immature Granulocytes 0.08 (H) 0.00 - 0.07 K/uL    Comment: Performed at [facility], [address], [place], [place] [address]  Protime-INR     Status: None   Collection Time: 10/29/20  2:28 PM  Result Value Ref Range   Prothrombin Time 15.0 11.4 - 15.2 seconds   INR 1.2 0.8 - 1.2    Comment: (NOTE) INR goal varies based on device and disease states. Performed at [facility], [address], [place], [place] [address]   APTT     Status: Abnormal   Collection Time: 10/29/20  2:28 PM  Result Value Ref Range   aPTT 38 (H) 24 - 36 seconds    Comment:        IF BASELINE aPTT IS ELEVATED, SUGGEST PATIENT RISK ASSESSMENT BE USED TO DETERMINE APPROPRIATE ANTICOAGULANT THERAPY. Performed at [facility],  [address], [place], [place] [address]   Blood Culture (routine x 2)     Status: None (Preliminary result)   Collection Time: 10/29/20  2:28 PM   Specimen: BLOOD  Result Value Ref Range   Specimen Description BLOOD SITE NOT SPECIFIED    Special Requests      BOTTLES DRAWN AEROBIC AND ANAEROBIC Blood Culture adequate volume   Culture      NO GROWTH < 24 HOURS Performed at [facility], [address], [place], [place] [address]    Report Status PENDING   Urine Culture     Status: Abnormal   Collection Time: 10/29/20  3:45 PM   Specimen: In/Out Cath Urine  Result Value Ref Range   Specimen Description IN/OUT CATH URINE    Special Requests      NONE Performed at [facility], [address], [place], [place] [address]  Culture MULTIPLE SPECIES PRESENT, SUGGEST RECOLLECTION (A)    Report Status 10/30/2020 FINAL   I-stat chem 8, ED (not at [facility] or [facility])     Status: Abnormal   Collection Time: 10/29/20  4:29 PM  Result Value Ref Range   Sodium 133 (L) 135 - 145 mmol/L   Potassium 4.9 3.5 - 5.1 mmol/L   Chloride 106 98 - 111 mmol/L   BUN 23 8 - 23 mg/dL   Creatinine, Ser 6.54 0.44 - 1.00 mg/dL   Glucose, Bld 97 70 - 99 mg/dL    Comment: Glucose reference range applies only to samples taken after fasting for at least 8 hours.   Calcium, Ion 1.14 (L) 1.15 - 1.40 mmol/L   TCO2 20 (L) 22 - 32 mmol/L   Hemoglobin 10.5 (L) 12.0 - 15.0 g/dL   HCT 65.0 (L) 35.4 - 65.6 %  Lactic acid, plasma     Status: None   Collection Time: 10/29/20  5:10 PM  Result Value Ref Range   Lactic Acid, Venous 1.8 0.5 - 1.9 mmol/L    Comment: Performed at [facility], [address] [place], [place] [address]  Creatinine, serum     Status: Abnormal   Collection Time: 10/29/20  5:26 PM  Result Value Ref Range   Creatinine, Ser 0.98 0.44 - 1.00 mg/dL   GFR, Estimated 58 (L) >60 mL/min    Comment: (NOTE) Calculated using the CKD-EPI Creatinine Equation (2021) Performed at [facility], [address] [place], [place] [address]   CBC     Status: Abnormal   Collection Time: 10/29/20  8:50 PM  Result Value Ref Range   WBC 10.5 4.0 - 10.5 K/uL   RBC 3.57 (L) 3.87 - 5.11 MIL/uL   Hemoglobin 9.9 (L) 12.0 - 15.0 g/dL   HCT 74.9 (L) 44.9 - 67.5 %   MCV 89.4 80.0 - 100.0 fL   MCH 27.7 26.0 - 34.0 pg   MCHC 31.0 30.0 - 36.0 g/dL   RDW 91.6 38.4 - 66.5 %   Platelets 178 150 - 400 K/uL   nRBC 0.0 0.0 - 0.2 %    Comment: Performed at [facility], [address] [place], [place] [address]  Protime-INR     Status: Abnormal   Collection Time: 10/30/20  5:58 AM  Result Value Ref Range   Prothrombin Time 15.4 (H) 11.4 - 15.2 seconds   INR 1.2 0.8 - 1.2    Comment: (NOTE) INR goal varies based on device and disease states. Performed at [facility], [address], [place], [place] [address]   Cortisol-am, blood     Status: None   Collection Time: 10/30/20  5:58 AM  Result Value Ref Range   Cortisol - AM 14.6 6.7 - 22.6 ug/dL    Comment: Performed at [facility], [address] [place], [place] [address]  Comprehensive metabolic panel     Status: Abnormal   Collection Time: 10/30/20  5:58 AM  Result Value Ref Range   Sodium 133 (L) 135 - 145 mmol/L   Potassium 3.2 (L) 3.5 - 5.1 mmol/L   Chloride 100 98 - 111 mmol/L   CO2 20 (L) 22 - 32 mmol/L   Glucose, Bld 82 70 - 99 mg/dL    Comment: Glucose reference range applies only to samples taken after fasting for at least 8 hours.   BUN 14 8 - 23 mg/dL   Creatinine,  Ser 1.06 (H) 0.44 - 1.00 mg/dL   Calcium 8.3 (L) 8.9 - 10.3 mg/dL   Total Protein 5.2 (L) 6.5 - 8.1 g/dL   Albumin 2.4 (L) 3.5 - 5.0 g/dL   AST 21 15 - 41 U/L   ALT 18 0 - 44 U/L   Alkaline Phosphatase 61 38 - 126 U/L   Total Bilirubin 1.0 0.3 - 1.2 mg/dL   GFR, Estimated 53 (L) >60 mL/min    Comment: (NOTE) Calculated using the CKD-EPI Creatinine Equation (2021)    Anion gap 13 5 - 15    Comment: Performed at [facility], [address], [place], [place] [address]    MICRO:  IMAGING: CT ABDOMEN PELVIS W CONTRAST  Result Date: 10/29/2020 CLINICAL DATA:  Abdominal abscess or infection. EXAM: CT ABDOMEN AND PELVIS WITH CONTRAST TECHNIQUE: Multidetector CT imaging of the abdomen and pelvis was performed using the standard protocol following bolus administration of intravenous contrast. CONTRAST:  80mL OMNIPAQUE IOHEXOL 300 MG/ML  SOLN COMPARISON:  Jun 10, 2017. FINDINGS: Lower chest: No acute abnormality. Hepatobiliary: No focal liver abnormality is seen. Status post cholecystectomy. No biliary dilatation. Pancreas: Unremarkable. No pancreatic ductal dilatation or surrounding inflammatory changes. Spleen: Normal in size without focal abnormality. Adrenals/Urinary Tract: Adrenal glands appear normal. Right renal cyst is noted. No hydronephrosis or renal obstruction is noted. Urinary bladder is unremarkable. Stomach/Bowel: Stomach is within normal limits. Appendix appears normal. No evidence of bowel wall thickening, distention, or inflammatory changes. Vascular/Lymphatic: Aortic atherosclerosis. No enlarged abdominal or pelvic lymph nodes. Reproductive: Status post hysterectomy. No adnexal masses. Other: 2 moderate size fat containing periumbilical hernias are noted. No ascites is noted. 6.1 x 3.3 cm fluid collection is seen in the inferior portion of the right buttocks region inferior to right ischial tuberosity. Musculoskeletal: No acute or significant osseous findings. IMPRESSION: 6.1 x 3.3 cm fluid collection is seen in the inferior portion of the right buttocks region inferior to right ischial tuberosity. This was present on prior exam. Potentially this may represent abscess. Two moderate size fat containing periumbilical hernias are noted. Aortic Atherosclerosis (ICD10-I70.0). Electronically Signed   By: Lupita Raider M.D.   On: 10/29/2020 16:40   DG Chest Port 1 View  Result Date: 10/29/2020 CLINICAL DATA:   Possible sepsis, dehydration. EXAM: PORTABLE CHEST 1 VIEW COMPARISON:  Jun 06, 2017. FINDINGS: The heart size and mediastinal contours are within normal limits. Both lungs are clear. The visualized skeletal structures are unremarkable. IMPRESSION: No active disease. Electronically Signed   By: Lupita Raider M.D.   On: 10/29/2020 15:10    HISTORICAL MICRO/IMAGING  Assessment/Plan:   #UTI Patient initially presented with tachycardia, tachypnea, and hypotension. She had an elevated white count, which prompted her PCP to encourage the patient to seek care at the hospital. She received 3 L of IV fluids and was initially started on aztreonam and vancomycin. Leukocytosis is now resolved and was likely secondary to hemoconcentration, as the patient was dehydrated.  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 She denies any pain at the site as well. Likely does not need any sort of surgical intervention for this. - Continue to monitor

## 2020-10-30 NOTE — ED Notes (Addendum)
This RN spoke to daughter on phone but now pt states she does not want to talk to daughter at this time. Daughter not going to come to hospital at this time

## 2020-10-30 NOTE — Progress Notes (Signed)
PROGRESS NOTE    Andrea Yu  WGY:[id number] DOB: 1940/12/20 DOA: 10/29/2020 PCP: Jarome Matin, MD   Brief Narrative: 80 year old with past medical history significant for paroxysmal A. fib, GERD, neuropathy, morbid obesity, psoriatic arthritis, iron deficiency anemia, history of subarachnoid hemorrhage, hypertension who presents to the ED complaining of the urinary frequency and decreased oral intake.  Patient report having UTI and was prescribed Bactrim by her PCP.  She completed course and despite still continues to have dysuria.  Patient was noted to be slightly confused. Evaluation in the ED patient was tachycardic, hypotensive, tachypneic, febrile.  Leukocytosis white blood cell 15.3.  UA moderate leukocyte.  CT abdomen show fluid collection inferior portion of the right buttock region inferior to the right ischial tuberosity.    Assessment & Plan:   Principal Problem:   Severe sepsis (HCC) Active Problems:   Adrenal insufficiency (HCC)   PAF (paroxysmal atrial fibrillation) (HCC)   Psoriatic arthritis (HCC)   Iron deficiency anemia- transfused this admission   Depression   Peripheral neuropathy   Obesity (BMI 30-39.9)- suspected sleep apnea, declines sleep study   SAH (subarachnoid hemorrhage) (HCC)   Essential hypertension   GERD (gastroesophageal reflux disease)   1-sepsis secondary to UTI Presented with fever tachycardia tachypnea and hypotension. Received IV fluids. Plan to continue with aztreonam. Still having low grade fever.   2-question of right gluteal abscess: Present  with fever, hypotension concern for sepsis.  General surgery and ID has been consulted.  She had a prior history of abscess.  Per ID and surgery no need for drainage. Less likely abscess.   3-A. fib with RVR: Cardiology consulted and following Started on oral Cardizem.  Hypertension: Continue with Cardizem  Generalized weakness: PT OT  Depression Continue with  Cymbalta. HypoKalemia: Replete orally.  Peripheral neuropathy: Continue with gabapentin   Pressure Injury 04/29/17 Stage II -  Partial thickness loss of dermis presenting as a shallow open ulcer with a red, pink wound bed without slough. healing callus, open pink wound bed (Active)  04/29/17 0800  Location: Heel  Location Orientation: Right  Staging: Stage II -  Partial thickness loss of dermis presenting as a shallow open ulcer with a red, pink wound bed without slough.  Wound Description (Comments): healing callus, open pink wound bed  Present on Admission:                   Estimated body mass index is 36.96 kg/m as calculated from the following:   Height as of this encounter: 4\' 11"  (1.499 m).   Weight as of this encounter: 83 kg.   DVT prophylaxis: Lovenox Code Status: Full code Family Communication: Disposition Plan:  Status is: Inpatient  Remains inpatient appropriate because:IV treatments appropriate due to intensity of illness or inability to take PO  Dispo: The patient is from: Home              Anticipated d/c is to: Home              Patient currently is not medically stable to d/c.   Difficult to place patient No        Consultants:  ID General surgery  Procedures:    Antimicrobials:  Aztreonam  Subjective: She denies chest pain or shortness of breath.  Objective: Vitals:   10/30/20 1400 10/30/20 1500 10/30/20 1620 10/30/20 1639  BP: (!) 116/54 105/85 110/75 (!) 97/54  Pulse: 92 92 89 97  Resp: 16 (!) 24 (!) 22 20  Temp: 98.7 F (37.1 C)  98.5 F (36.9 C) (!) 100.4 F (38 C)  TempSrc: Oral  Oral Oral  SpO2:  98% 97% 98%  Weight:      Height:        Intake/Output Summary (Last 24 hours) at 10/30/2020 1804 Last data filed at 10/30/2020 1413 Gross per 24 hour  Intake 840.29 ml  Output 2950 ml  Net -2109.71 ml   Filed Weights   10/30/20 0700  Weight: 83 kg    Examination:  General exam: Appears calm and comfortable   Respiratory system: Clear to auscultation. Respiratory effort normal. Cardiovascular system: S1 & S2 heard, RRR. No JVD, murmurs, rubs, gallops or clicks. No pedal edema. Gastrointestinal system: Abdomen is nondistended, soft and nontender. No organomegaly or masses felt. Normal bowel sounds heard. Central nervous system: Alert and oriented. No focal neurological deficits. Extremities: Symmetric 5 x 5 power. Skin: No rashes, lesions or ulcers Psychiatry: Judgement and insight appear normal. Mood & affect appropriate.     Data Reviewed: I have personally reviewed following labs and imaging studies  CBC: Recent Labs  Lab 10/29/20 1428 10/29/20 1629 10/29/20 2050  WBC 15.3*  --  10.5  NEUTROABS 12.1*  --   --   HGB 12.0 10.5* 9.9*  HCT 38.1 31.0* 31.9*  MCV 89.6  --  89.4  PLT 197  --  178   Basic Metabolic Panel: Recent Labs  Lab 10/29/20 1428 10/29/20 1629 10/29/20 1726 10/30/20 0558  NA 133* 133*  --  133*  K 5.1 4.9  --  3.2*  CL 103 106  --  100  CO2 18*  --   --  20*  GLUCOSE 105* 97  --  82  BUN 24* 23  --  14  CREATININE 1.22* 1.00 0.98 1.06*  CALCIUM 9.0  --   --  8.3*   GFR: Estimated Creatinine Clearance: 38.8 mL/min (A) (by C-G formula based on SCr of 1.06 mg/dL (H)). Liver Function Tests: Recent Labs  Lab 10/29/20 1428 10/30/20 0558  AST 22 21  ALT 19 18  ALKPHOS 76 61  BILITOT 0.9 1.0  PROT 6.7 5.2*  ALBUMIN 2.9* 2.4*   No results for input(s): LIPASE, AMYLASE in the last 168 hours. No results for input(s): AMMONIA in the last 168 hours. Coagulation Profile: Recent Labs  Lab 10/29/20 1428 10/30/20 0558  INR 1.2 1.2   Cardiac Enzymes: No results for input(s): CKTOTAL, CKMB, CKMBINDEX, TROPONINI in the last 168 hours. BNP (last 3 results) No results for input(s): PROBNP in the last 8760 hours. HbA1C: No results for input(s): HGBA1C in the last 72 hours. CBG: No results for input(s): GLUCAP in the last 168 hours. Lipid Profile: No  results for input(s): CHOL, HDL, LDLCALC, TRIG, CHOLHDL, LDLDIRECT in the last 72 hours. Thyroid Function Tests: No results for input(s): TSH, T4TOTAL, FREET4, T3FREE, THYROIDAB in the last 72 hours. Anemia Panel: No results for input(s): VITAMINB12, FOLATE, FERRITIN, TIBC, IRON, RETICCTPCT in the last 72 hours. Sepsis Labs: Recent Labs  Lab 10/29/20 1428 10/29/20 1710 10/30/20 0558  PROCALCITON  --   --  1.20  LATICACIDVEN 1.0 1.8  --     Recent Results (from the past 240 hour(s))  Blood Culture (routine x 2)     Status: None (Preliminary result)   Collection Time: 10/29/20 10:45 AM   Specimen: BLOOD  Result Value Ref Range Status   Specimen Description BLOOD BLOOD RIGHT FOREARM  Final   Special Requests  Final    BOTTLES DRAWN AEROBIC AND ANAEROBIC Blood Culture adequate volume   Culture   Final    NO GROWTH < 24 HOURS Performed at [facility], [address] [place], [place] [address]    Report Status PENDING  Incomplete  Resp Panel by RT-PCR (Flu A&B, Covid)     Status: None   Collection Time: 10/29/20  2:22 PM   Specimen: Nasopharyngeal(NP) swabs in vial transport medium  Result Value Ref Range Status   SARS Coronavirus 2 by RT PCR NEGATIVE NEGATIVE Final    Comment: (NOTE) SARS-CoV-2 target nucleic acids are NOT DETECTED.  The SARS-CoV-2 RNA is generally detectable in upper respiratory specimens during the acute phase of infection. The lowest concentration of SARS-CoV-2 viral copies this assay can detect is 138 copies/mL. A negative result does not preclude SARS-Cov-2 infection and should not be used as the sole basis for treatment or other patient management decisions. A negative result may occur with  improper specimen collection/handling, submission of specimen other than nasopharyngeal swab, presence of viral mutation(s) within the areas targeted by this assay, and inadequate number of viral copies(<138 copies/mL). A negative result must be combined  with clinical observations, patient history, and epidemiological information. The expected result is Negative.  Fact Sheet for Patients:  BloggerCourse.com  Fact Sheet for Healthcare Providers:  SeriousBroker.it  This test is no t yet approved or cleared by the [place] FDA and  has been authorized for detection and/or diagnosis of SARS-CoV-2 by FDA under an Emergency Use Authorization (EUA). This EUA will remain  in effect (meaning this test can be used) for the duration of the COVID-19 declaration under Section 564(b)(1) of the Act, 21 U.S.C.section 360bbb-3(b)(1), unless the authorization is terminated  or revoked sooner.       Influenza A by PCR NEGATIVE NEGATIVE Final   Influenza B by PCR NEGATIVE NEGATIVE Final    Comment: (NOTE) The Xpert Xpress SARS-CoV-2/FLU/RSV plus assay is intended as an aid in the diagnosis of influenza from Nasopharyngeal swab specimens and should not be used as a sole basis for treatment. Nasal washings and aspirates are unacceptable for Xpert Xpress SARS-CoV-2/FLU/RSV testing.  Fact Sheet for Patients: BloggerCourse.com  Fact Sheet for Healthcare Providers: SeriousBroker.it  This test is not yet approved or cleared by the [place] FDA and has been authorized for detection and/or diagnosis of SARS-CoV-2 by FDA under an Emergency Use Authorization (EUA). This EUA will remain in effect (meaning this test can be used) for the duration of the COVID-19 declaration under Section 564(b)(1) of the Act, 21 U.S.C. section 360bbb-3(b)(1), unless the authorization is terminated or revoked.  Performed at [facility], [address] [place], [place] [address]   Blood Culture (routine x 2)     Status: None (Preliminary result)   Collection Time: 10/29/20  2:28 PM   Specimen: BLOOD  Result Value Ref Range Status   Specimen Description  BLOOD SITE NOT SPECIFIED  Final   Special Requests   Final    BOTTLES DRAWN AEROBIC AND ANAEROBIC Blood Culture adequate volume   Culture   Final    NO GROWTH < 24 HOURS Performed at [facility], [address] [place], [place] [address]    Report Status PENDING  Incomplete  Urine Culture     Status: Abnormal   Collection Time: 10/29/20  3:45 PM   Specimen: In/Out Cath Urine  Result Value Ref Range Status   Specimen Description  IN/OUT CATH URINE  Final   Special Requests   Final    NONE Performed at [facility], [address] [place], [place] [address]    Culture MULTIPLE SPECIES PRESENT, SUGGEST RECOLLECTION (A)  Final   Report Status 10/30/2020 FINAL  Final         Radiology Studies: CT ABDOMEN PELVIS W CONTRAST  Result Date: 10/29/2020 CLINICAL DATA:  Abdominal abscess or infection. EXAM: CT ABDOMEN AND PELVIS WITH CONTRAST TECHNIQUE: Multidetector CT imaging of the abdomen and pelvis was performed using the standard protocol following bolus administration of intravenous contrast. CONTRAST:  41mL OMNIPAQUE IOHEXOL 300 MG/ML  SOLN COMPARISON:  Jun 10, 2017. FINDINGS: Lower chest: No acute abnormality. Hepatobiliary: No focal liver abnormality is seen. Status post cholecystectomy. No biliary dilatation. Pancreas: Unremarkable. No pancreatic ductal dilatation or surrounding inflammatory changes. Spleen: Normal in size without focal abnormality. Adrenals/Urinary Tract: Adrenal glands appear normal. Right renal cyst is noted. No hydronephrosis or renal obstruction is noted. Urinary bladder is unremarkable. Stomach/Bowel: Stomach is within normal limits. Appendix appears normal. No evidence of bowel wall thickening, distention, or inflammatory changes. Vascular/Lymphatic: Aortic atherosclerosis. No enlarged abdominal or pelvic lymph nodes. Reproductive: Status post hysterectomy. No adnexal masses. Other: 2 moderate size fat containing periumbilical hernias are noted. No  ascites is noted. 6.1 x 3.3 cm fluid collection is seen in the inferior portion of the right buttocks region inferior to right ischial tuberosity. Musculoskeletal: No acute or significant osseous findings. IMPRESSION: 6.1 x 3.3 cm fluid collection is seen in the inferior portion of the right buttocks region inferior to right ischial tuberosity. This was present on prior exam. Potentially this may represent abscess. Two moderate size fat containing periumbilical hernias are noted. Aortic Atherosclerosis (ICD10-I70.0). Electronically Signed   By: Lupita Raider M.D.   On: 10/29/2020 16:40   DG Chest Port 1 View  Result Date: 10/29/2020 CLINICAL DATA:  Possible sepsis, dehydration. EXAM: PORTABLE CHEST 1 VIEW COMPARISON:  Jun 06, 2017. FINDINGS: The heart size and mediastinal contours are within normal limits. Both lungs are clear. The visualized skeletal structures are unremarkable. IMPRESSION: No active disease. Electronically Signed   By: Lupita Raider M.D.   On: 10/29/2020 15:10        Scheduled Meds:  aspirin EC  81 mg Oral QPM   diltiazem  120 mg Oral Daily   docusate sodium  100 mg Oral BID   DULoxetine  90 mg Oral Daily   enoxaparin (LOVENOX) injection  30 mg Subcutaneous Q24H   LORazepam  1 mg Intravenous Once   pantoprazole  40 mg Oral Daily   Continuous Infusions:  lactated ringers 75 mL/hr at 10/30/20 0906     LOS: 1 day    Time spent: 35 minutes.     Alba Cory, MD [facility]   If 7PM-7AM, please contact night-coverage [email or web address]  10/30/2020, 6:04 PM

## 2020-10-30 NOTE — ED Notes (Signed)
MD aware of pt BP - holding Ativan at this time

## 2020-10-30 NOTE — ED Notes (Signed)
BP able to answer questions approprietly but some confusion. Pupils equal and reactive, No facial droop, leg or arm drift. MD aware

## 2020-10-30 NOTE — ED Notes (Addendum)
Pt becoming extremely agitated and tachy. Pt stating that she wants to go home, she is in pain, she is cold, etc. Pt temp of 99.8 - tylenol and pain meds given. Pt moved to hospital bed for comfort. Pt would like this RN to call daughter. This RN to try and contact daughter - MD notified

## 2020-10-30 NOTE — Progress Notes (Addendum)
Pharmacy Antibiotic Note  Andrea Yu is a 80 y.o. female admitted on 10/29/2020 with sepsis.  Pharmacy was consulted for vancomycin and aztreonam dosing earlier today. Vancomycin and aztreonam were discontinued earlier today per ID recommendation (lactate and labs consistent with dehydration, now improved, no longer treating for sepsis). Pharmacy is now consulted to dose aztreonam for UTI, due to continued fever.  Pt has recent hx of UTI treated with TMP/SMX, but patient is dehydrated and has not had an appetite and has had slight confusion for past few days. Pt was sent to ED from PCP due to leukocytosis.   WBC 10.5 (down from 15.3 on admission), temp 100.4 F; Scr 1.06, CrCl 38.8 ml/min (renal function stable)  Pt has hx of anaphylaxis to PCN  Plan: Aztreonam 1 gm IV Q 8 hrs for UTI Monitor WBC, temp, clinical course, cultures, renal function  Height: 4\' 11"  (149.9 cm) Weight: 83 kg (182 lb 15.7 oz) IBW/kg (Calculated) : 43.2  Temp (24hrs), Avg:99.9 F (37.7 C), Min:98.5 F (36.9 C), Max:103.2 F (39.6 C)  Recent Labs  Lab 10/29/20 1428 10/29/20 1629 10/29/20 1710 10/29/20 1726 10/29/20 2050 10/30/20 0558  WBC 15.3*  --   --   --  10.5  --   CREATININE 1.22* 1.00  --  0.98  --  1.06*  LATICACIDVEN 1.0  --  1.8  --   --   --      Estimated Creatinine Clearance: 38.8 mL/min (A) (by C-G formula based on SCr of 1.06 mg/dL (H)).    Allergies  Allergen Reactions   Penicillins Anaphylaxis    THROAT SWELLING  Has patient had a PCN reaction causing immediate rash, facial/tongue/throat swelling, SOB or lightheadedness with hypotension:  # # YES # #  Has patient had a PCN reaction causing severe rash involving mucus membranes or skin necrosis: # # # YES # # # Has patient had a PCN reaction that required hospitalization:No Has patient had a PCN reaction occurring within the last 10 years:  # # YES # #  If all of the above answers are "NO", then may proceed with Cephalosporin  use.    Prolia [Denosumab] Other (See Comments)    Severe Pain in the groin area.   Ceftriaxone     Possible cause of neutropenia   Ciprofloxacin Hcl Other (See Comments)   Fosamax [Alendronate Sodium]     UNSPECIFIED REACTION    Latex Swelling   Lisinopril Cough   Tubersol [Tuberculin Ppd] Other (See Comments)    Unknown     Antimicrobials this admission: Vancomycin X 1 10/5 Aztreonam 10/5 >> Metronidazole IV X 1 10/5  Microbiology results: 10/5 Bcx X 2: NGTD 10/5 UCx:  mutiple species present, recollection suggested 10/5 COVID, flu A, flu B: negative  Thank you for allowing pharmacy to be a part of this patient's care.  12/30/20, PharmD, BCPS, [facility] Clinical Pharmacist

## 2020-10-30 NOTE — ED Notes (Signed)
Ativan drawn up and wasted but never given to pt - Entire dose wasted with Maralyn Sago RN

## 2020-10-30 NOTE — Progress Notes (Signed)
Notified by RN that HR is sustaining 120-140 bpm and still in A-fib. Received cardizem PO this am. BP is stable.  Resume cardizem infusion and monitor closely with A-fib with RVR.

## 2020-10-30 NOTE — Progress Notes (Signed)
Progress Note  Patient Name: Andrea Yu Date of Encounter: 10/30/2020  [facility] Cardiologist: Thurmon Fair, MD   Subjective   PT denies CP  Breathign is OK    Inpatient Medications    Scheduled Meds:  aspirin EC  81 mg Oral QPM   diltiazem  120 mg Oral Daily   docusate sodium  100 mg Oral BID   DULoxetine  90 mg Oral Daily   enoxaparin (LOVENOX) injection  30 mg Subcutaneous Q24H   ertapenem  1 g Intravenous Q24H   LORazepam  1 mg Intravenous Once   pantoprazole  40 mg Oral Daily   Continuous Infusions:  aztreonam Stopped (10/30/20 0704)   lactated ringers 75 mL/hr at 10/30/20 0906   potassium chloride 10 mEq (10/30/20 0909)   sodium chloride     PRN Meds: acetaminophen **OR** acetaminophen, HYDROcodone-acetaminophen, ibuprofen, ondansetron **OR** ondansetron (ZOFRAN) IV   Vital Signs    Vitals:   10/30/20 0800 10/30/20 0815 10/30/20 0845 10/30/20 0922  BP:  107/62 94/77 102/65  Pulse: 93 (!) 122 73 (!) 121  Resp: 20 17 (!) 23 19  Temp:      TempSrc:      SpO2: 96% 97% 98% 97%  Weight:      Height:        Intake/Output Summary (Last 24 hours) at 10/30/2020 0953 Last data filed at 10/30/2020 0228 Gross per 24 hour  Intake 3423 ml  Output 2950 ml  Net 473 ml   Last 3 Weights 10/30/2020 12/13/2019 05/25/2018  Weight (lbs) 182 lb 15.7 oz 183 lb 144 lb  Weight (kg) 83 kg 83.008 kg 65.318 kg      Telemetry    Afib   80s to 110s  - Personally Reviewed  ECG    No new  - Personally Reviewed  Physical Exam   GEN: No acute distress.   Neck: Neck is full   Cardiac:  Irreg irreg   NO S3   Respiratory: Clear to auscultation bilaterally. GI: Soft, nontender, non-distended  MS: No LE edema;   L Leg with defomity  Neuro:  Nonfocal  Psych: Normal affect   Labs    High Sensitivity Troponin:  No results for input(s): TROPONINIHS in the last 720 hours.   Chemistry Recent Labs  Lab 10/29/20 1428 10/29/20 1629 10/29/20 1726 10/30/20 0558  NA  133* 133*  --  133*  K 5.1 4.9  --  3.2*  CL 103 106  --  100  CO2 18*  --   --  20*  GLUCOSE 105* 97  --  82  BUN 24* 23  --  14  CREATININE 1.22* 1.00 0.98 1.06*  CALCIUM 9.0  --   --  8.3*  PROT 6.7  --   --  5.2*  ALBUMIN 2.9*  --   --  2.4*  AST 22  --   --  21  ALT 19  --   --  18  ALKPHOS 76  --   --  61  BILITOT 0.9  --   --  1.0  GFRNONAA 45*  --  58* 53*  ANIONGAP 12  --   --  13    Lipids No results for input(s): CHOL, TRIG, HDL, LABVLDL, LDLCALC, CHOLHDL in the last 168 hours.  Hematology Recent Labs  Lab 10/29/20 1428 10/29/20 1629 10/29/20 2050  WBC 15.3*  --  10.5  RBC 4.25  --  3.57*  HGB 12.0 10.5* 9.9*  HCT 38.1 31.0* 31.9*  MCV 89.6  --  89.4  MCH 28.2  --  27.7  MCHC 31.5  --  31.0  RDW 15.4  --  15.0  PLT 197  --  178   Thyroid No results for input(s): TSH, FREET4 in the last 168 hours.  BNPNo results for input(s): BNP, PROBNP in the last 168 hours.  DDimer No results for input(s): DDIMER in the last 168 hours.   Radiology    CT ABDOMEN PELVIS W CONTRAST  Result Date: 10/29/2020 CLINICAL DATA:  Abdominal abscess or infection. EXAM: CT ABDOMEN AND PELVIS WITH CONTRAST TECHNIQUE: Multidetector CT imaging of the abdomen and pelvis was performed using the standard protocol following bolus administration of intravenous contrast. CONTRAST:  109mL OMNIPAQUE IOHEXOL 300 MG/ML  SOLN COMPARISON:  Jun 10, 2017. FINDINGS: Lower chest: No acute abnormality. Hepatobiliary: No focal liver abnormality is seen. Status post cholecystectomy. No biliary dilatation. Pancreas: Unremarkable. No pancreatic ductal dilatation or surrounding inflammatory changes. Spleen: Normal in size without focal abnormality. Adrenals/Urinary Tract: Adrenal glands appear normal. Right renal cyst is noted. No hydronephrosis or renal obstruction is noted. Urinary bladder is unremarkable. Stomach/Bowel: Stomach is within normal limits. Appendix appears normal. No evidence of bowel wall thickening,  distention, or inflammatory changes. Vascular/Lymphatic: Aortic atherosclerosis. No enlarged abdominal or pelvic lymph nodes. Reproductive: Status post hysterectomy. No adnexal masses. Other: 2 moderate size fat containing periumbilical hernias are noted. No ascites is noted. 6.1 x 3.3 cm fluid collection is seen in the inferior portion of the right buttocks region inferior to right ischial tuberosity. Musculoskeletal: No acute or significant osseous findings. IMPRESSION: 6.1 x 3.3 cm fluid collection is seen in the inferior portion of the right buttocks region inferior to right ischial tuberosity. This was present on prior exam. Potentially this may represent abscess. Two moderate size fat containing periumbilical hernias are noted. Aortic Atherosclerosis (ICD10-I70.0). Electronically Signed   By: Lupita Raider M.D.   On: 10/29/2020 16:40   DG Chest Port 1 View  Result Date: 10/29/2020 CLINICAL DATA:  Possible sepsis, dehydration. EXAM: PORTABLE CHEST 1 VIEW COMPARISON:  Jun 06, 2017. FINDINGS: The heart size and mediastinal contours are within normal limits. Both lungs are clear. The visualized skeletal structures are unremarkable. IMPRESSION: No active disease. Electronically Signed   By: Lupita Raider M.D.   On: 10/29/2020 15:10    Cardiac Studies     Patient Profile       Assessment & Plan     1  Atrial fibrillation   Rmains in afib but with better rates   Would continue on current regimen   Was not on anticoagulation due to falls/SAH Tolerating IV fluids   BP is soft at times but OK    Watch I/O closely      Will follw  For questions or updates, please contact [facility] Please consult [email or web address] for contact info under        Signed, Dietrich Pates, MD  10/30/2020, 9:53 AM

## 2020-10-30 NOTE — Progress Notes (Signed)
Spoke to MD, Chotiner, per pt heart rate. Pt heart rate is sustaining above 120s. MD will advise next steps.

## 2020-10-30 NOTE — Consult Note (Signed)
Andrea Yu 09-04-1940  [id number].    Requesting MD: Dr. Hartley Barefoot Chief Complaint/Reason for Consult: possible right gluteal abscess  HPI:  This is a pleasant 80 yo female who is here in the ED secondary to urosepsis from a UTI who underwent a CT scan to rule out intra-abdominal source of infection.  She was found to have a 6.1x3.3cm fluid collection in the inferior portion of the right buttocks region inferior to right ischial tuberosity.  The patient states this area has been there for years and does not cause her any issues.  This is not tender and not problematic.  She had this area aspirated 3 years ago by IR with just bloody fluid with no growth.  We have been asked to evaluate this for further evaluation.  ROS: ROS: Please see HPI, otherwise dedicated ROS to skin specific system are negative.  Family History  Problem Relation Age of Onset   Heart attack Father        1951-deceased    Liver disease Mother    Aneurysm Son 69       Deceased     Past Medical History:  Diagnosis Date   A-fib [facility])    "just after knee 2nd OR" (11/07/2012)   Anemia    Arthritis    RA , SACROTIC ARTHRITIS   Complication of anesthesia    found to be in afib when she woke up   Dysrhythmia    GERD (gastroesophageal reflux disease)    Head injury, closed, with concussion    History of blood transfusion    "after 1 of my knee ORs and today" (11/07/2012)   Hypertension    Peripheral neuropathy    "from back OR in 2005; in hands and feet" (11/07/2012)   Psoriatic arthritis (HCC)    on Prednisone Rx    Past Surgical History:  Procedure Laterality Date   ANKLE FUSION Right 01/07/2016   Procedure: Right Tibiocalcaneal Fusion;  Surgeon: Nadara Mustard, MD;  Location: [facility] OR;  Service: Orthopedics;  Laterality: Right;   BACK SURGERY     CATARACT EXTRACTION W/ INTRAOCULAR LENS  IMPLANT, BILATERAL Bilateral 2007   CHOLECYSTECTOMY  2000's   COLONOSCOPY N/A 11/10/2012   Procedure:  COLONOSCOPY;  Surgeon: Theda Belfast, MD;  Location: [facility] ENDOSCOPY;  Service: Endoscopy;  Laterality: N/A;   ESOPHAGOGASTRODUODENOSCOPY N/A 11/10/2012   Procedure: ESOPHAGOGASTRODUODENOSCOPY (EGD);  Surgeon: Theda Belfast, MD;  Location: [facility] ENDOSCOPY;  Service: Endoscopy;  Laterality: N/A;   EYE SURGERY     JOINT REPLACEMENT     POSTERIOR LUMBAR FUSION  2005   "got a rod and 4 pins in there" (11/07/2012)   REPLACEMENT TOTAL KNEE BILATERAL Bilateral 2007   TRANSTHORACIC ECHOCARDIOGRAM  05/24/2006   TUBAL LIGATION  1974   VAGINAL HYSTERECTOMY  1983    Social History:  reports that she quit smoking about 39 years ago. Her smoking use included cigarettes. She has a 15.00 pack-year smoking history. She has never used smokeless tobacco. She reports that she does not drink alcohol and does not use drugs.  Allergies:  Allergies  Allergen Reactions   Penicillins Anaphylaxis    THROAT SWELLING  Has patient had a PCN reaction causing immediate rash, facial/tongue/throat swelling, SOB or lightheadedness with hypotension:  # # YES # #  Has patient had a PCN reaction causing severe rash involving mucus membranes or skin necrosis: # # # YES # # # Has patient had a PCN reaction  that required hospitalization:No Has patient had a PCN reaction occurring within the last 10 years:  # # YES # #  If all of the above answers are "NO", then may proceed with Cephalosporin use.    Prolia [Denosumab] Other (See Comments)    Severe Pain in the groin area.   Ceftriaxone     Possible cause of neutropenia   Ciprofloxacin Hcl Other (See Comments)   Fosamax [Alendronate Sodium]     UNSPECIFIED REACTION    Latex Swelling   Lisinopril Cough   Tubersol [Tuberculin Ppd] Other (See Comments)    Unknown     (Not in a hospital admission)    Physical Exam: Blood pressure (!) 143/77, pulse 85, temperature 99.9 F (37.7 C), temperature source Rectal, resp. rate 16, height 4\' 11"  (1.499 m), weight 83 kg, SpO2 100  %. Skin: right gluteal area with a small area of outpouching that is difficult to correspond to the CT scan or not.  There is no erythema, fluctuance, warmth, or evidence externally of abscess.  Results for orders placed or performed during the hospital encounter of 10/29/20 (from the past 48 hour(s))  Blood Culture (routine x 2)     Status: None (Preliminary result)   Collection Time: 10/29/20 10:45 AM   Specimen: BLOOD  Result Value Ref Range   Specimen Description BLOOD BLOOD RIGHT FOREARM    Special Requests      BOTTLES DRAWN AEROBIC AND ANAEROBIC Blood Culture adequate volume   Culture      NO GROWTH < 24 HOURS Performed at [facility] Lab, [address], [place], [place]    Report Status PENDING   Resp Panel by RT-PCR (Flu A&B, Covid)     Status: None   Collection Time: 10/29/20  2:22 PM   Specimen: Nasopharyngeal(NP) swabs in vial transport medium  Result Value Ref Range   SARS Coronavirus 2 by RT PCR NEGATIVE NEGATIVE    Comment: (NOTE) SARS-CoV-2 target nucleic acids are NOT DETECTED.  The SARS-CoV-2 RNA is generally detectable in upper respiratory specimens during the acute phase of infection. The lowest concentration of SARS-CoV-2 viral copies this assay can detect is 138 copies/mL. A negative result does not preclude SARS-Cov-2 infection and should not be used as the sole basis for treatment or other patient management decisions. A negative result may occur with  improper specimen collection/handling, submission of specimen other than nasopharyngeal swab, presence of viral mutation(s) within the areas targeted by this assay, and inadequate number of viral copies(<138 copies/mL). A negative result must be combined with clinical observations, patient history, and epidemiological information. The expected result is Negative.  Fact Sheet for Patients:  12/29/20  Fact Sheet for Healthcare Providers:   BloggerCourse.com  This test is no t yet approved or cleared by the [place] FDA and  has been authorized for detection and/or diagnosis of SARS-CoV-2 by FDA under an Emergency Use Authorization (EUA). This EUA will remain  in effect (meaning this test can be used) for the duration of the COVID-19 declaration under Section 564(b)(1) of the Act, 21 U.S.C.section 360bbb-3(b)(1), unless the authorization is terminated  or revoked sooner.       Influenza A by PCR NEGATIVE NEGATIVE   Influenza B by PCR NEGATIVE NEGATIVE    Comment: (NOTE) The Xpert Xpress SARS-CoV-2/FLU/RSV plus assay is intended as an aid in the diagnosis of influenza from Nasopharyngeal swab specimens and should not be used as a sole basis for treatment. Nasal washings  and aspirates are unacceptable for Xpert Xpress SARS-CoV-2/FLU/RSV testing.  Fact Sheet for Patients: BloggerCourse.com  Fact Sheet for Healthcare Providers: SeriousBroker.it  This test is not yet approved or cleared by the [place] FDA and has been authorized for detection and/or diagnosis of SARS-CoV-2 by FDA under an Emergency Use Authorization (EUA). This EUA will remain in effect (meaning this test can be used) for the duration of the COVID-19 declaration under Section 564(b)(1) of the Act, 21 U.S.C. section 360bbb-3(b)(1), unless the authorization is terminated or revoked.  Performed at [facility], [address], [place], [place] [address]   Urinalysis, Routine w reflex microscopic     Status: Abnormal   Collection Time: 10/29/20  2:22 PM  Result Value Ref Range   Color, Urine AMBER (A) YELLOW    Comment: BIOCHEMICALS MAY BE AFFECTED BY COLOR   APPearance HAZY (A) CLEAR   Specific Gravity, Urine 1.020 1.005 - 1.030   pH 5.0 5.0 - 8.0   Glucose, UA NEGATIVE NEGATIVE mg/dL   Hgb urine dipstick MODERATE (A) NEGATIVE   Bilirubin Urine NEGATIVE  NEGATIVE   Ketones, ur 20 (A) NEGATIVE mg/dL   Protein, ur 30 (A) NEGATIVE mg/dL   Nitrite NEGATIVE NEGATIVE   Leukocytes,Ua MODERATE (A) NEGATIVE   RBC / HPF 6-10 0 - 5 RBC/hpf   WBC, UA 21-50 0 - 5 WBC/hpf   Bacteria, UA RARE (A) NONE SEEN   Squamous Epithelial / LPF 0-5 0 - 5   Mucus PRESENT     Comment: Performed at [facility], [address], [place], [place] [address]  Lactic acid, plasma     Status: None   Collection Time: 10/29/20  2:28 PM  Result Value Ref Range   Lactic Acid, Venous 1.0 0.5 - 1.9 mmol/L    Comment: Performed at [facility], [address] [place], [place] [address]  Comprehensive metabolic panel     Status: Abnormal   Collection Time: 10/29/20  2:28 PM  Result Value Ref Range   Sodium 133 (L) 135 - 145 mmol/L   Potassium 5.1 3.5 - 5.1 mmol/L   Chloride 103 98 - 111 mmol/L   CO2 18 (L) 22 - 32 mmol/L   Glucose, Bld 105 (H) 70 - 99 mg/dL    Comment: Glucose reference range applies only to samples taken after fasting for at least 8 hours.   BUN 24 (H) 8 - 23 mg/dL   Creatinine, Ser 6.55 (H) 0.44 - 1.00 mg/dL   Calcium 9.0 8.9 - 37.4 mg/dL   Total Protein 6.7 6.5 - 8.1 g/dL   Albumin 2.9 (L) 3.5 - 5.0 g/dL   AST 22 15 - 41 U/L   ALT 19 0 - 44 U/L   Alkaline Phosphatase 76 38 - 126 U/L   Total Bilirubin 0.9 0.3 - 1.2 mg/dL   GFR, Estimated 45 (L) >60 mL/min    Comment: (NOTE) Calculated using the CKD-EPI Creatinine Equation (2021)    Anion gap 12 5 - 15    Comment: Performed at [facility], [address] [place], [place] [address]  CBC WITH DIFFERENTIAL     Status: Abnormal   Collection Time: 10/29/20  2:28 PM  Result Value Ref Range   WBC 15.3 (H) 4.0 - 10.5 K/uL   RBC 4.25 3.87 - 5.11 MIL/uL   Hemoglobin 12.0 12.0 - 15.0 g/dL   HCT 86.7 54.4 - 92.0 %   MCV 89.6 80.0 -  100.0 fL   MCH 28.2 26.0 - 34.0 pg   MCHC 31.5 30.0 - 36.0 g/dL   RDW 05.3 97.6 - 73.4 %   Platelets 197 150 - 400 K/uL   nRBC 0.0 0.0 - 0.2 %    Neutrophils Relative % 77 %   Neutro Abs 12.1 (H) 1.7 - 7.7 K/uL   Lymphocytes Relative 13 %   Lymphs Abs 1.9 0.7 - 4.0 K/uL   Monocytes Relative 8 %   Monocytes Absolute 1.2 (H) 0.1 - 1.0 K/uL   Eosinophils Relative 0 %   Eosinophils Absolute 0.0 0.0 - 0.5 K/uL   Basophils Relative 1 %   Basophils Absolute 0.1 0.0 - 0.1 K/uL   Immature Granulocytes 1 %   Abs Immature Granulocytes 0.08 (H) 0.00 - 0.07 K/uL    Comment: Performed at [facility], [address], [place], [place] [address]  Protime-INR     Status: None   Collection Time: 10/29/20  2:28 PM  Result Value Ref Range   Prothrombin Time 15.0 11.4 - 15.2 seconds   INR 1.2 0.8 - 1.2    Comment: (NOTE) INR goal varies based on device and disease states. Performed at [facility], [address], [place], [place] [address]   APTT     Status: Abnormal   Collection Time: 10/29/20  2:28 PM  Result Value Ref Range   aPTT 38 (H) 24 - 36 seconds    Comment:        IF BASELINE aPTT IS ELEVATED, SUGGEST PATIENT RISK ASSESSMENT BE USED TO DETERMINE APPROPRIATE ANTICOAGULANT THERAPY. Performed at [facility], [address] [place], [place] [address]   Blood Culture (routine x 2)     Status: None (Preliminary result)   Collection Time: 10/29/20  2:28 PM   Specimen: BLOOD  Result Value Ref Range   Specimen Description BLOOD SITE NOT SPECIFIED    Special Requests      BOTTLES DRAWN AEROBIC AND ANAEROBIC Blood Culture adequate volume   Culture      NO GROWTH < 24 HOURS Performed at [facility], [address], [place], [place] [address]    Report Status PENDING   Urine Culture     Status: Abnormal   Collection Time: 10/29/20  3:45 PM   Specimen: In/Out Cath Urine  Result Value Ref Range   Specimen Description IN/OUT CATH URINE    Special Requests      NONE Performed at [facility] Lab, [address], [place], [place] [address]    Culture MULTIPLE SPECIES PRESENT, SUGGEST RECOLLECTION (A)     Report Status 10/30/2020 FINAL   I-stat chem 8, ED (not at [facility] or [facility])     Status: Abnormal   Collection Time: 10/29/20  4:29 PM  Result Value Ref Range   Sodium 133 (L) 135 - 145 mmol/L   Potassium 4.9 3.5 - 5.1 mmol/L   Chloride 106 98 - 111 mmol/L   BUN 23 8 - 23 mg/dL   Creatinine, Ser 9.62 0.44 - 1.00 mg/dL   Glucose, Bld 97 70 - 99 mg/dL    Comment: Glucose reference range applies only to samples taken after fasting for at least 8 hours.   Calcium, Ion 1.14 (L) 1.15 - 1.40 mmol/L   TCO2 20 (L) 22 - 32 mmol/L   Hemoglobin 10.5 (L) 12.0 - 15.0 g/dL   HCT 22.9 (L) 79.8 - 92.1 %  Lactic acid, plasma  Status: None   Collection Time: 10/29/20  5:10 PM  Result Value Ref Range   Lactic Acid, Venous 1.8 0.5 - 1.9 mmol/L    Comment: Performed at [facility] Lab, [address], [place], [place] [address]  Creatinine, serum     Status: Abnormal   Collection Time: 10/29/20  5:26 PM  Result Value Ref Range   Creatinine, Ser 0.98 0.44 - 1.00 mg/dL   GFR, Estimated 58 (L) >60 mL/min    Comment: (NOTE) Calculated using the CKD-EPI Creatinine Equation (2021) Performed at [facility], [address], [place], [place] [address]   CBC     Status: Abnormal   Collection Time: 10/29/20  8:50 PM  Result Value Ref Range   WBC 10.5 4.0 - 10.5 K/uL   RBC 3.57 (L) 3.87 - 5.11 MIL/uL   Hemoglobin 9.9 (L) 12.0 - 15.0 g/dL   HCT 10.2 (L) 72.5 - 36.6 %   MCV 89.4 80.0 - 100.0 fL   MCH 27.7 26.0 - 34.0 pg   MCHC 31.0 30.0 - 36.0 g/dL   RDW 44.0 34.7 - 42.5 %   Platelets 178 150 - 400 K/uL   nRBC 0.0 0.0 - 0.2 %    Comment: Performed at [facility], [address] [place], [place] [address]  Protime-INR     Status: Abnormal   Collection Time: 10/30/20  5:58 AM  Result Value Ref Range   Prothrombin Time 15.4 (H) 11.4 - 15.2 seconds   INR 1.2 0.8 - 1.2    Comment: (NOTE) INR goal varies based on device and disease states. Performed at [facility], [address], [place], [place] [address]   Cortisol-am, blood     Status: None   Collection Time: 10/30/20  5:58 AM  Result Value Ref Range   Cortisol - AM 14.6 6.7 - 22.6 ug/dL    Comment: Performed at [facility], [address], [place], [place] [address]  Procalcitonin     Status: None   Collection Time: 10/30/20  5:58 AM  Result Value Ref Range   Procalcitonin 1.20 ng/mL    Comment:        Interpretation: PCT > 0.5 ng/mL and <= 2 ng/mL: Systemic infection (sepsis) is possible, but other conditions are known to elevate PCT as well. (NOTE)       Sepsis PCT Algorithm           Lower Respiratory Tract                                      Infection PCT Algorithm    ----------------------------     ----------------------------         PCT < 0.25 ng/mL                PCT < 0.10 ng/mL          Strongly encourage             Strongly discourage   discontinuation of antibiotics    initiation of antibiotics    ----------------------------     -----------------------------       PCT 0.25 - 0.50 ng/mL            PCT 0.10 - 0.25 ng/mL               OR       >  80% decrease in PCT            Discourage initiation of                                            antibiotics      Encourage discontinuation           of antibiotics    ----------------------------     -----------------------------         PCT >= 0.50 ng/mL              PCT 0.26 - 0.50 ng/mL                AND       <80% decrease in PCT             Encourage initiation of                                             antibiotics       Encourage continuation           of antibiotics    ----------------------------     -----------------------------        PCT >= 0.50 ng/mL                  PCT > 0.50 ng/mL               AND         increase in PCT                  Strongly encourage                                      initiation of antibiotics    Strongly encourage escalation           of antibiotics                                      -----------------------------                                           PCT <= 0.25 ng/mL                                                 OR                                        > 80% decrease in PCT                                      Discontinue / Do not initiate  antibiotics  Performed at [facility], [address] [place], [place] [address]   Comprehensive metabolic panel     Status: Abnormal   Collection Time: 10/30/20  5:58 AM  Result Value Ref Range   Sodium 133 (L) 135 - 145 mmol/L   Potassium 3.2 (L) 3.5 - 5.1 mmol/L   Chloride 100 98 - 111 mmol/L   CO2 20 (L) 22 - 32 mmol/L   Glucose, Bld 82 70 - 99 mg/dL    Comment: Glucose reference range applies only to samples taken after fasting for at least 8 hours.   BUN 14 8 - 23 mg/dL   Creatinine, Ser 6.04 (H) 0.44 - 1.00 mg/dL   Calcium 8.3 (L) 8.9 - 10.3 mg/dL   Total Protein 5.2 (L) 6.5 - 8.1 g/dL   Albumin 2.4 (L) 3.5 - 5.0 g/dL   AST 21 15 - 41 U/L   ALT 18 0 - 44 U/L   Alkaline Phosphatase 61 38 - 126 U/L   Total Bilirubin 1.0 0.3 - 1.2 mg/dL   GFR, Estimated 53 (L) >60 mL/min    Comment: (NOTE) Calculated using the CKD-EPI Creatinine Equation (2021)    Anion gap 13 5 - 15    Comment: Performed at [facility], [address] [place], [place] [address]   CT ABDOMEN PELVIS W CONTRAST  Result Date: 10/29/2020 CLINICAL DATA:  Abdominal abscess or infection. EXAM: CT ABDOMEN AND PELVIS WITH CONTRAST TECHNIQUE: Multidetector CT imaging of the abdomen and pelvis was performed using the standard protocol following bolus administration of intravenous contrast. CONTRAST:  80mL OMNIPAQUE IOHEXOL 300 MG/ML  SOLN COMPARISON:  Jun 10, 2017. FINDINGS: Lower chest: No acute abnormality. Hepatobiliary: No focal liver abnormality is seen. Status post cholecystectomy. No biliary dilatation. Pancreas: Unremarkable. No pancreatic ductal dilatation or surrounding inflammatory  changes. Spleen: Normal in size without focal abnormality. Adrenals/Urinary Tract: Adrenal glands appear normal. Right renal cyst is noted. No hydronephrosis or renal obstruction is noted. Urinary bladder is unremarkable. Stomach/Bowel: Stomach is within normal limits. Appendix appears normal. No evidence of bowel wall thickening, distention, or inflammatory changes. Vascular/Lymphatic: Aortic atherosclerosis. No enlarged abdominal or pelvic lymph nodes. Reproductive: Status post hysterectomy. No adnexal masses. Other: 2 moderate size fat containing periumbilical hernias are noted. No ascites is noted. 6.1 x 3.3 cm fluid collection is seen in the inferior portion of the right buttocks region inferior to right ischial tuberosity. Musculoskeletal: No acute or significant osseous findings. IMPRESSION: 6.1 x 3.3 cm fluid collection is seen in the inferior portion of the right buttocks region inferior to right ischial tuberosity. This was present on prior exam. Potentially this may represent abscess. Two moderate size fat containing periumbilical hernias are noted. Aortic Atherosclerosis (ICD10-I70.0). Electronically Signed   By: Lupita Raider M.D.   On: 10/29/2020 16:40   DG Chest Port 1 View  Result Date: 10/29/2020 CLINICAL DATA:  Possible sepsis, dehydration. EXAM: PORTABLE CHEST 1 VIEW COMPARISON:  Jun 06, 2017. FINDINGS: The heart size and mediastinal contours are within normal limits. Both lungs are clear. The visualized skeletal structures are unremarkable. IMPRESSION: No active disease. Electronically Signed   By: Lupita Raider M.D.   On: 10/29/2020 15:10      Assessment/Plan Chronic right gluteal fluid collection This was aspirated 3 years ago by IR and was noted to be bloody with no growth.  Doubt this is abscess as it is not warm, erythematous, or painful.  I offered aspiration and  she declined which I think it totally reasonable.  Discussed also with ID, Dr. Luciana Axe, who was present at the same  time and he agreed with no further intervention for this.  We will sign off.   Letha Cape, [facility] 10/30/2020, 2:02 PM Please see Amion for pager number during day hours 7:00am-4:30pm or 7:00am -11:30am on weekends

## 2020-10-30 NOTE — Progress Notes (Addendum)
Pharmacy Antibiotic Note  Andrea Yu is a 80 y.o. female admitted on 10/29/2020 with sepsis.  Pharmacy has been consulted for vancomycin and aztreonam dosing.  Recent hx of UTI treated with Bactrim, but patient is dehydrated and has not had an appetite and has has some slight confusion for past few days. Sent to ED from PCP due to leukocytosis.   Febrile to 103.2 this AM, WBC 10.5 this AM, Cr 1.06mg /dL  Plan: Vancomycin 7902 mg IV LD once Aztreonam 2 g IV q8h Metronidazole 500 mg IV once Follow cultures, labs, and clinical improvement  Maintenance dose vancomycin 1000 mg IV Q 48 hrs (eAUC: 430, SCr used: 1.0, Wt used: 83 kg, Vd 0.5)  Height: 4\' 11"  (149.9 cm) Weight: 83 kg (182 lb 15.7 oz) IBW/kg (Calculated) : 43.2  Temp (24hrs), Avg:100 F (37.8 C), Min:98.5 F (36.9 C), Max:103.2 F (39.6 C)  Recent Labs  Lab 10/29/20 1428 10/29/20 1629 10/29/20 1710 10/29/20 1726 10/29/20 2050 10/30/20 0558  WBC 15.3*  --   --   --  10.5  --   CREATININE 1.22* 1.00  --  0.98  --  1.06*  LATICACIDVEN 1.0  --  1.8  --   --   --      Estimated Creatinine Clearance: 38.8 mL/min (A) (by C-G formula based on SCr of 1.06 mg/dL (H)).    Allergies  Allergen Reactions   Penicillins Anaphylaxis    THROAT SWELLING  Has patient had a PCN reaction causing immediate rash, facial/tongue/throat swelling, SOB or lightheadedness with hypotension:  # # YES # #  Has patient had a PCN reaction causing severe rash involving mucus membranes or skin necrosis: # # # YES # # # Has patient had a PCN reaction that required hospitalization:No Has patient had a PCN reaction occurring within the last 10 years:  # # YES # #  If all of the above answers are "NO", then may proceed with Cephalosporin use.    Prolia [Denosumab] Other (See Comments)    Severe Pain in the groin area.   Ceftriaxone     Possible cause of neutropenia   Ciprofloxacin Hcl Other (See Comments)   Fosamax [Alendronate Sodium]      UNSPECIFIED REACTION    Latex Swelling   Lisinopril Cough   Tubersol [Tuberculin Ppd] Other (See Comments)    Unknown     Antimicrobials this admission: Vancomycin 10/5>> Aztreonam 10/5>> Metronidazole 10/5 x 1 dose  Microbiology results: 10/5 BCx: in process 10/5 UCx: in process   Thank you for allowing pharmacy to be a part of this patient's care.  12/30/20, PharmD, [facility] Emergency Medicine Clinical Pharmacist ED RPh Phone: [phone] Main RX: [phone]

## 2020-10-30 NOTE — ED Notes (Signed)
Provider at bedside.BP and VS noted.

## 2020-10-30 NOTE — ED Notes (Signed)
Pt resting/ sleeping, easily arousable, alert, NAD, calm, interactive, resps e/u, speaking in clear complete sentences, VSS, denies pain or nausea.  

## 2020-10-30 NOTE — ED Notes (Signed)
Pt refusing morning labs at this time - MD notified

## 2020-10-30 NOTE — Progress Notes (Signed)
Patient brought to 4E from ED. Daughter Clydie Braun present. Telemetry wall monitor applied, CCMD notified. Temp 100.4, tylenol given. Patient oriented to room and staff. Call bell in reach.  Kenard Gower, RN

## 2020-10-30 NOTE — ED Notes (Signed)
MD aware of pt BP and HR

## 2020-10-31 ENCOUNTER — Ambulatory Visit: Payer: Medicare Other | Admitting: Podiatry

## 2020-10-31 ENCOUNTER — Inpatient Hospital Stay (HOSPITAL_COMMUNITY): Payer: Medicare Other

## 2020-10-31 DIAGNOSIS — A419 Sepsis, unspecified organism: Secondary | ICD-10-CM | POA: Diagnosis not present

## 2020-10-31 DIAGNOSIS — R6520 Severe sepsis without septic shock: Secondary | ICD-10-CM | POA: Diagnosis not present

## 2020-10-31 LAB — CBC
HCT: 29.6 % — ABNORMAL LOW (ref 36.0–46.0)
Hemoglobin: 9.4 g/dL — ABNORMAL LOW (ref 12.0–15.0)
MCH: 28.1 pg (ref 26.0–34.0)
MCHC: 31.8 g/dL (ref 30.0–36.0)
MCV: 88.6 fL (ref 80.0–100.0)
Platelets: 184 10*3/uL (ref 150–400)
RBC: 3.34 MIL/uL — ABNORMAL LOW (ref 3.87–5.11)
RDW: 15 % (ref 11.5–15.5)
WBC: 16 10*3/uL — ABNORMAL HIGH (ref 4.0–10.5)
nRBC: 0 % (ref 0.0–0.2)

## 2020-10-31 LAB — BASIC METABOLIC PANEL
Anion gap: 12 (ref 5–15)
BUN: 10 mg/dL (ref 8–23)
CO2: 18 mmol/L — ABNORMAL LOW (ref 22–32)
Calcium: 8.1 mg/dL — ABNORMAL LOW (ref 8.9–10.3)
Chloride: 103 mmol/L (ref 98–111)
Creatinine, Ser: 0.91 mg/dL (ref 0.44–1.00)
GFR, Estimated: >60 mL/min (ref >60)
Glucose, Bld: 83 mg/dL (ref 70–99)
Potassium: 3.4 mmol/L — ABNORMAL LOW (ref 3.5–5.1)
Sodium: 133 mmol/L — ABNORMAL LOW (ref 135–145)

## 2020-10-31 LAB — BLOOD CULTURE ID PANEL (REFLEXED) - BCID2

## 2020-10-31 MED ORDER — POTASSIUM CHLORIDE CRYS ER 20 MEQ PO TBCR
40.0000 meq | EXTENDED_RELEASE_TABLET | Freq: Once | ORAL | Status: AC
  Administered 2020-10-31: 40 meq via ORAL
  Filled 2020-10-31: qty 2

## 2020-10-31 MED ORDER — SODIUM CHLORIDE 0.9 % IV SOLN
1.0000 g | INTRAVENOUS | Status: DC
  Filled 2020-10-31: qty 10

## 2020-10-31 MED ORDER — METOPROLOL TARTRATE 25 MG PO TABS
25.0000 mg | ORAL_TABLET | Freq: Two times a day (BID) | ORAL | Status: AC
  Administered 2020-10-31 – 2020-11-03 (×8): 25 mg via ORAL
  Filled 2020-10-31 (×8): qty 1

## 2020-10-31 MED ORDER — ENOXAPARIN SODIUM 40 MG/0.4ML IJ SOSY
40.0000 mg | PREFILLED_SYRINGE | INTRAMUSCULAR | Status: DC
  Administered 2020-10-31 – 2020-11-08 (×9): 40 mg via SUBCUTANEOUS
  Filled 2020-10-31 (×9): qty 0.4

## 2020-10-31 MED ORDER — SODIUM CHLORIDE 0.9 % IV SOLN
2.0000 g | INTRAVENOUS | Status: DC
  Administered 2020-10-31 – 2020-11-04 (×5): 2 g via INTRAVENOUS
  Filled 2020-10-31 (×6): qty 20

## 2020-10-31 NOTE — Progress Notes (Signed)
PHARMACY - PHYSICIAN COMMUNICATION CRITICAL VALUE ALERT - BLOOD CULTURE IDENTIFICATION (BCID)  Andrea Yu is an 80 y.o. female who presented to [facility] on 10/29/2020 with a chief complaint of urinary frequency and decreased oral intake.   Assessment:  1/4 BCx growing GPC - BCID showing strep species. Concerned for UTI - started on aztreonam then switched to ceftriaxone - ID was consulted for R gluteal abscess however plan to just monitor at this time.   Name of physician (or Provider) Contacted: Regalado  Current antibiotics: Ceftriaxone 1g IV every day   Changes to prescribed antibiotics recommended:  Likely contaminant in Bcx given 1/4 Bcx. Would continue ceftriaxone for UTI coverage at this time - will increase to 2g IV.   Results for orders placed or performed during the hospital encounter of 10/29/20  Blood Culture ID Panel (Reflexed) (Collected: 10/29/2020 10:45 AM)  Result Value Ref Range   Enterococcus faecalis NOT DETECTED NOT DETECTED   Enterococcus Faecium NOT DETECTED NOT DETECTED   Listeria monocytogenes NOT DETECTED NOT DETECTED   Staphylococcus species NOT DETECTED NOT DETECTED   Staphylococcus aureus (BCID) NOT DETECTED NOT DETECTED   Staphylococcus epidermidis NOT DETECTED NOT DETECTED   Staphylococcus lugdunensis NOT DETECTED NOT DETECTED   Streptococcus species DETECTED (A) NOT DETECTED   Streptococcus agalactiae NOT DETECTED NOT DETECTED   Streptococcus pneumoniae NOT DETECTED NOT DETECTED   Streptococcus pyogenes NOT DETECTED NOT DETECTED   A.calcoaceticus-baumannii NOT DETECTED NOT DETECTED   Bacteroides fragilis NOT DETECTED NOT DETECTED   Enterobacterales NOT DETECTED NOT DETECTED   Enterobacter cloacae complex NOT DETECTED NOT DETECTED   Escherichia coli NOT DETECTED NOT DETECTED   Klebsiella aerogenes NOT DETECTED NOT DETECTED   Klebsiella oxytoca NOT DETECTED NOT DETECTED   Klebsiella pneumoniae NOT DETECTED NOT DETECTED   Proteus species NOT  DETECTED NOT DETECTED   Salmonella species NOT DETECTED NOT DETECTED   Serratia marcescens NOT DETECTED NOT DETECTED   Haemophilus influenzae NOT DETECTED NOT DETECTED   Neisseria meningitidis NOT DETECTED NOT DETECTED   Pseudomonas aeruginosa NOT DETECTED NOT DETECTED   Stenotrophomonas maltophilia NOT DETECTED NOT DETECTED   Candida albicans NOT DETECTED NOT DETECTED   Candida auris NOT DETECTED NOT DETECTED   Candida glabrata NOT DETECTED NOT DETECTED   Candida krusei NOT DETECTED NOT DETECTED   Candida parapsilosis NOT DETECTED NOT DETECTED   Candida tropicalis NOT DETECTED NOT DETECTED   Cryptococcus neoformans/gattii NOT DETECTED NOT DETECTED    Sherron Monday, PharmD, BCCCP Clinical Pharmacist  Phone: [phone] 10/31/2020 9:32 AM  Please check AMION for all [facility] phone numbers After 10:00 PM, call Main Pharmacy [phone]

## 2020-10-31 NOTE — Progress Notes (Signed)
Paged by RN stating patient has PO cardizem due and cardizem drip was restarted last night. Drip running at 5 mg/hr. HR in the 90-110s. PO dose due is 120 mg. Systolic BP in the 140-150s. Will give PO cardizem now and pause drip. If rates still elevated, consider increasing PO dose if pressure allows.   Dr. Tenny Craw to see.

## 2020-10-31 NOTE — Progress Notes (Signed)
Progress Note  Patient Name: Andrea Yu Date of Encounter: 10/31/2020  [facility] Cardiologist: Thurmon Fair, MD   Subjective   Patient awake but sleepy  COmfortable in bed  No SOB     Inpatient Medications    Scheduled Meds:  aspirin EC  81 mg Oral QPM   diltiazem  120 mg Oral Daily   docusate sodium  100 mg Oral BID   DULoxetine  90 mg Oral Daily   enoxaparin (LOVENOX) injection  30 mg Subcutaneous Q24H   LORazepam  1 mg Intravenous Once   pantoprazole  40 mg Oral Daily   potassium chloride  40 mEq Oral Once   Continuous Infusions:  cefTRIAXone (ROCEPHIN)  IV     diltiazem (CARDIZEM) infusion 5 mg/hr (10/30/20 2236)   lactated ringers 75 mL/hr at 10/30/20 2027   PRN Meds: acetaminophen **OR** acetaminophen, HYDROcodone-acetaminophen, ibuprofen, ondansetron **OR** ondansetron (ZOFRAN) IV   Vital Signs    Vitals:   10/30/20 2329 10/31/20 0301 10/31/20 0344 10/31/20 0759  BP: 107/87 (!) 121/54 (!) 106/52 (!) 152/93  Pulse: (!) 131 92 74 (!) 104  Resp: 20 19 17 17   Temp: (!) 100.7 F (38.2 C) 98.9 F (37.2 C) 99.4 F (37.4 C) 98.2 F (36.8 C)  TempSrc: Oral Oral Oral Oral  SpO2: 96% 95% 98% 92%  Weight:      Height:        Intake/Output Summary (Last 24 hours) at 10/31/2020 0826 Last data filed at 10/31/2020 0600 Gross per 24 hour  Intake 1896.26 ml  Output 400 ml  Net 1496.26 ml   Last 3 Weights 10/30/2020 12/13/2019 05/25/2018  Weight (lbs) 182 lb 15.7 oz 183 lb 144 lb  Weight (kg) 83 kg 83.008 kg 65.318 kg      Telemetry    Afib 80s to 120s - Personally Reviewed  ECG    No new  - Personally Reviewed  Physical Exam   GEN: No acute distress laying flat in bed    Neck: Neck is full   Cardiac:  Irreg irreg   NO S3   Respiratory: Clear to auscultation bilaterally.at bases   GI: Soft, nontender, non-distended  EXt: No LE edema;      Labs    High Sensitivity Troponin:  No results for input(s): TROPONINIHS in the last 720 hours.    Chemistry Recent Labs  Lab 10/29/20 1428 10/29/20 1629 10/29/20 1726 10/30/20 0558 10/31/20 0159  NA 133* 133*  --  133* 133*  K 5.1 4.9  --  3.2* 3.4*  CL 103 106  --  100 103  CO2 18*  --   --  20* 18*  GLUCOSE 105* 97  --  82 83  BUN 24* 23  --  14 10  CREATININE 1.22* 1.00 0.98 1.06* 0.91  CALCIUM 9.0  --   --  8.3* 8.1*  PROT 6.7  --   --  5.2*  --   ALBUMIN 2.9*  --   --  2.4*  --   AST 22  --   --  21  --   ALT 19  --   --  18  --   ALKPHOS 76  --   --  61  --   BILITOT 0.9  --   --  1.0  --   GFRNONAA 45*  --  58* 53* >60  ANIONGAP 12  --   --  13 12    Lipids No results for input(s):  CHOL, TRIG, HDL, LABVLDL, LDLCALC, CHOLHDL in the last 168 hours.  Hematology Recent Labs  Lab 10/29/20 1428 10/29/20 1629 10/29/20 2050 10/31/20 0159  WBC 15.3*  --  10.5 16.0*  RBC 4.25  --  3.57* 3.34*  HGB 12.0 10.5* 9.9* 9.4*  HCT 38.1 31.0* 31.9* 29.6*  MCV 89.6  --  89.4 88.6  MCH 28.2  --  27.7 28.1  MCHC 31.5  --  31.0 31.8  RDW 15.4  --  15.0 15.0  PLT 197  --  178 184   Thyroid No results for input(s): TSH, FREET4 in the last 168 hours.  BNPNo results for input(s): BNP, PROBNP in the last 168 hours.  DDimer No results for input(s): DDIMER in the last 168 hours.   Radiology    CT ABDOMEN PELVIS W CONTRAST  Result Date: 10/29/2020 CLINICAL DATA:  Abdominal abscess or infection. EXAM: CT ABDOMEN AND PELVIS WITH CONTRAST TECHNIQUE: Multidetector CT imaging of the abdomen and pelvis was performed using the standard protocol following bolus administration of intravenous contrast. CONTRAST:  79mL OMNIPAQUE IOHEXOL 300 MG/ML  SOLN COMPARISON:  Jun 10, 2017. FINDINGS: Lower chest: No acute abnormality. Hepatobiliary: No focal liver abnormality is seen. Status post cholecystectomy. No biliary dilatation. Pancreas: Unremarkable. No pancreatic ductal dilatation or surrounding inflammatory changes. Spleen: Normal in size without focal abnormality. Adrenals/Urinary Tract:  Adrenal glands appear normal. Right renal cyst is noted. No hydronephrosis or renal obstruction is noted. Urinary bladder is unremarkable. Stomach/Bowel: Stomach is within normal limits. Appendix appears normal. No evidence of bowel wall thickening, distention, or inflammatory changes. Vascular/Lymphatic: Aortic atherosclerosis. No enlarged abdominal or pelvic lymph nodes. Reproductive: Status post hysterectomy. No adnexal masses. Other: 2 moderate size fat containing periumbilical hernias are noted. No ascites is noted. 6.1 x 3.3 cm fluid collection is seen in the inferior portion of the right buttocks region inferior to right ischial tuberosity. Musculoskeletal: No acute or significant osseous findings. IMPRESSION: 6.1 x 3.3 cm fluid collection is seen in the inferior portion of the right buttocks region inferior to right ischial tuberosity. This was present on prior exam. Potentially this may represent abscess. Two moderate size fat containing periumbilical hernias are noted. Aortic Atherosclerosis (ICD10-I70.0). Electronically Signed   By: Lupita Raider M.D.   On: 10/29/2020 16:40   DG Chest Port 1 View  Result Date: 10/29/2020 CLINICAL DATA:  Possible sepsis, dehydration. EXAM: PORTABLE CHEST 1 VIEW COMPARISON:  Jun 06, 2017. FINDINGS: The heart size and mediastinal contours are within normal limits. Both lungs are clear. The visualized skeletal structures are unremarkable. IMPRESSION: No active disease. Electronically Signed   By: Lupita Raider M.D.   On: 10/29/2020 15:10    Cardiac Studies     Patient Profile       Assessment & Plan     1  Atrial fibrillation   Patient had higher heart rates today   WIll add metoprolol to regimen   Also on low dose Dilt CD 120   May need to increase  Follow BP    Not on anticoagulation due to falls / Hx SAH  Off of IV fluids   Watch clinically   Volume status not bad  Will continue to follow    For questions or updates, please contact [facility] Please consult [email or web address] for contact info under        Signed, Dietrich Pates, MD  10/31/2020, 8:26 AM

## 2020-10-31 NOTE — Evaluation (Signed)
Physical Therapy Evaluation Patient Details Name: Andrea Yu MRN: [id number] DOB: 09/23/1940 Today's Date: 10/31/2020  History of Present Illness  Pt is an 80 y.o. female admitted 10/29/20 with confusion, decreased oral intake, urinary frequency; of note, pt with recent UTI. Workup for sepsis secondary to UTI. Abdominal CT with questionable R gluteal abscess. PMH includes PAF, neuropathy, anemia, SAH, HTN, obesity.   Clinical Impression  Pt presents with an overall decrease in functional mobility secondary to above. Pt keeping eyes closed majority of session, only stating words "no" and "huh" with no command following. Pt currently requiring maxA+2 totalA for bed mobility, unable to attempt standing but can maintain static sitting with fair balance. Suspect this is change from pt's baseline functional mobility status. Pt would likely benefit from SNF-level therapies to maximize functional mobility and independence prior to return home.   HR 104-133 during session     Recommendations for follow up therapy are one component of a multi-disciplinary discharge planning process, led by the attending physician.  Recommendations may be updated based on patient status, additional functional criteria and insurance authorization.  Follow Up Recommendations SNF;Supervision for mobility/OOB    Equipment Recommendations   (TBD)    Recommendations for Other Services       Precautions / Restrictions Precautions Precautions: Fall;Other (comment) Precaution Comments: Watch HR (tachy) Restrictions Weight Bearing Restrictions: No      Mobility  Bed Mobility Overal bed mobility: Needs Assistance Bed Mobility: Supine to Sit;Sit to Supine     Supine to sit: +2 for physical assistance;HOB elevated;Total assist Sit to supine: +2 for physical assistance;Total assist   General bed mobility comments: Max+2 to totalA for supine<>sit with use of bed rail, pt engaging trunk to maintain upright once  sitting; attempting to lay down to opposite side of bed, totalA for trunk and BLE management return to supine    Transfers                 General transfer comment: unable - Attempted initiation of standing with RW, then with bilateral HHA, pt with no initiation of movement and appears fatigued falling to side, deferred further attempts  Ambulation/Gait                Stairs            Wheelchair Mobility    Modified Rankin (Stroke Patients Only)       Balance Overall balance assessment: Needs assistance Sitting-balance support: No upper extremity supported;Feet unsupported Sitting balance-Leahy Scale: Fair Sitting balance - Comments: able to maintain static sitting without UE support, feet do not reach floor due ot pt's short height                                     Pertinent Vitals/Pain Pain Assessment: Faces Faces Pain Scale: Hurts a little bit Pain Location: Guarding with BUE movements Pain Intervention(s): Limited activity within patient's tolerance;Repositioned    Home Living Family/patient expects to be discharged to:: Skilled nursing facility                 Additional Comments: Per chart (PT/OT Evaluation from 05/2017), pt from home with husband, non-ambulatory using w/c and assist for transfers    Prior Function Level of Independence: Needs assistance         Comments: Per chart (PT/OT Evaluation from 2019), pt from home with husband, non-ambulatory using w/c and assist for  transfers     Hand Dominance        Extremity/Trunk Assessment   Upper Extremity Assessment Upper Extremity Assessment: Generalized weakness;Difficult to assess due to impaired cognition (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 shoulder AROM)    Lower Extremity Assessment Lower Extremity Assessment: Generalized weakness;Difficult to assess due to impaired cognition (suspect some knee/hip flexor contraction, R foot everted)       Communication       Cognition Arousal/Alertness: Lethargic Behavior During Therapy: Flat affect Overall Cognitive Status: No family/caregiver present to determine baseline cognitive functioning                                 General Comments: Pt keeping eyes closed majority of session, seemingly awake at times with eyes open but still with little to no verbal responses, little to no command following. When asked to stand up, pt states, "No", otherwise not answering any questions except a few, "Huh?'s"      General Comments General comments (skin integrity, edema, etc.): HR 104-133 during session; SpO2 >/94% on RA    Exercises     Assessment/Plan    PT Assessment Patient needs continued PT services  PT Problem List Decreased strength;Decreased range of motion;Decreased activity tolerance;Decreased balance;Decreased mobility;Decreased cognition;Decreased knowledge of use of DME;Cardiopulmonary status limiting activity       PT Treatment Interventions DME instruction;Gait training;Functional mobility training;Therapeutic activities;Therapeutic exercise;Balance training;Patient/family education;Wheelchair mobility training    PT Goals (Current goals can be found in the Care Plan section)  Acute Rehab PT Goals Patient Stated Goal: None stated PT Goal Formulation: Patient unable to participate in goal setting Time For Goal Achievement: 11/14/20 Potential to Achieve Goals: Fair    Frequency Min 2X/week   Barriers to discharge        Co-evaluation PT/OT/SLP Co-Evaluation/Treatment: Yes Reason for Co-Treatment: Necessary to address cognition/behavior during functional activity;For patient/therapist safety;To address functional/ADL transfers PT goals addressed during session: Mobility/safety with mobility;Balance         AM-PAC PT "6 Clicks" Mobility  Outcome Measure Help needed turning from your back to your side while in a flat bed without using bedrails?: Total Help needed  moving from lying on your back to sitting on the side of a flat bed without using bedrails?: Total Help needed moving to and from a bed to a chair (including a wheelchair)?: Total Help needed standing up from a chair using your arms (e.g., wheelchair or bedside chair)?: Total Help needed to walk in hospital room?: Total Help needed climbing 3-5 steps with a railing? : Total 6 Click Score: 6    End of Session Equipment Utilized During Treatment: Gait belt Activity Tolerance: Patient limited by lethargy;Patient limited by fatigue Patient left: in bed;with call bell/phone within reach;with bed alarm set Nurse Communication: Mobility status PT Visit Diagnosis: Other abnormalities of gait and mobility (R26.89);Muscle weakness (generalized) (M62.81)    Time: 5053-9767 PT Time Calculation (min) (ACUTE ONLY): 19 min   Charges:   PT Evaluation $PT Eval Moderate Complexity: 1 Mod     Ina Homes, PT, DPT Acute Rehabilitation Services  Pager [phone] Office [phone]  Malachy Chamber 10/31/2020, 9:32 AM

## 2020-10-31 NOTE — Evaluation (Signed)
Occupational Therapy Evaluation Patient Details Name: Andrea Yu MRN: [id number] DOB: [birth year] Today's Date: 10/31/2020   History of Present Illness Pt is an 80 y.o. female admitted 10/29/20 with confusion, decreased oral intake, urinary frequency; of note, pt with recent UTI. Workup for sepsis secondary to UTI. Abdominal CT with questionable R gluteal abscess. PMH includes PAF, neuropathy, anemia, SAH, HTN, obesity.   Clinical Impression   Pt presents to OT with deficits in cognition, strength, balance, coordination and endurance. Pt unable to provide history, so information obtained from previous admission. Suspect pt is below functional baseline. Pt requires overall Total A x 2 for bed mobility and up to Total A for UB/LB ADLs. Pt limited by lethargy and confusion with eyes closed and minimal verbalizations during session. At this time, recommend SNF rehab to maximize independence and safety with daily tasks. Plan to progress OOB activities and command-following during ADLs in next session.   HR < 136bpm      Recommendations for follow up therapy are one component of a multi-disciplinary discharge planning process, led by the attending physician.  Recommendations may be updated based on patient status, additional functional criteria and insurance authorization.   Follow Up Recommendations  SNF;Supervision/Assistance - 24 hour    Equipment Recommendations  Other (comment) (to be determined; pending progress)    Recommendations for Other Services       Precautions / Restrictions Precautions Precautions: Fall;Other (comment) Precaution Comments: Watch HR (tachy) Restrictions Weight Bearing Restrictions: No      Mobility Bed Mobility Overal bed mobility: Needs Assistance Bed Mobility: Supine to Sit;Sit to Supine     Supine to sit: +2 for physical assistance;HOB elevated;Total assist Sit to supine: +2 for physical assistance;Total assist   General bed mobility comments:  Max+2 to totalA for supine<>sit with use of bed rail, pt engaging trunk to maintain upright once sitting; attempting to lay down to opposite side of bed, totalA for trunk and BLE management return to supine    Transfers                 General transfer comment: unable - Attempted initiation of standing with RW, then with bilateral HHA, pt with no initiation of movement and appears fatigued falling to side, deferred further attempts    Balance Overall balance assessment: Needs assistance Sitting-balance support: No upper extremity supported;Feet unsupported Sitting balance-Leahy Scale: Fair Sitting balance - Comments: able to maintain static sitting without UE support, feet do not reach floor due ot pt's short height. did sway to R side with fatigue                                   ADL either performed or assessed with clinical judgement   ADL Overall ADL's : Needs assistance/impaired Eating/Feeding: Maximal assistance   Grooming: Maximal assistance;Wash/dry face Grooming Details (indicate cue type and reason): able to bring washcloth to mouth once placed in hand, but required assist for completion of task Upper Body Bathing: Total assistance   Lower Body Bathing: Total assistance   Upper Body Dressing : Total assistance   Lower Body Dressing: Total assistance       Toileting- Clothing Manipulation and Hygiene: Total assistance         General ADL Comments: Limited by lethargy and confuaion this AM     Vision Patient Visual Report: Other (comment) (unsure due to cognition; to be further assessed) Vision Assessment?: Vision  impaired- to be further tested in functional context Additional Comments: to be further assessed; pt able to track and gaze at therapist but kept eyes closed for majority of session     Perception     Praxis      Pertinent Vitals/Pain Pain Assessment: Faces Faces Pain Scale: Hurts a little bit Pain Location: Guarding with BUE  movements Pain Descriptors / Indicators: Guarding Pain Intervention(s): Monitored during session     Hand Dominance     Extremity/Trunk Assessment Upper Extremity Assessment Upper Extremity Assessment: Generalized weakness;RUE deficits/detail;LUE deficits/detail RUE Deficits / Details: PROM to 95* shoulder flexion. strong grip strength but would not squeeze therapist hand on command RUE Coordination: decreased fine motor;decreased gross motor LUE Deficits / Details: More guarding of L UE when attempting to place BP cuff. PROM flexion to 60* LUE Coordination: decreased gross motor;decreased fine motor   Lower Extremity Assessment Lower Extremity Assessment: Defer to PT evaluation   Cervical / Trunk Assessment Cervical / Trunk Assessment: Kyphotic   Communication Communication Communication: Expressive difficulties   Cognition Arousal/Alertness: Lethargic Behavior During Therapy: Flat affect Overall Cognitive Status: No family/caregiver present to determine baseline cognitive functioning                                 General Comments: Pt keeping eyes closed majority of session, seemingly awake at times with eyes open but still with little to no verbal responses, little to no command following. When asked to stand up, pt states, "No", otherwise not answering any questions except a few, "Huh?'s"   General Comments  HR 104-133 during session; SpO2 >/94% on RA    Exercises     Shoulder Instructions      Home Living Family/patient expects to be discharged to:: Skilled nursing facility                                 Additional Comments: Per chart (PT/OT Evaluation from 05/2017), pt from home with husband, non-ambulatory using w/c and assist for transfers      Prior Functioning/Environment Level of Independence: Needs assistance        Comments: Per chart (PT/OT Evaluation from 2019), pt from home with husband, non-ambulatory using w/c and assist  for transfers, using [facility] for toileting. Pt unable to provide history due to confusion        OT Problem List: Decreased strength;Decreased activity tolerance;Decreased range of motion;Impaired balance (sitting and/or standing);Decreased coordination;Decreased cognition;Decreased safety awareness      OT Treatment/Interventions: Self-care/ADL training;Therapeutic exercise;Energy conservation;DME and/or AE instruction;Therapeutic activities;Patient/family education;Balance training    OT Goals(Current goals can be found in the care plan section) Acute Rehab OT Goals Patient Stated Goal: None stated OT Goal Formulation: Patient unable to participate in goal setting Time For Goal Achievement: 11/14/20 Potential to Achieve Goals: Fair ADL Goals Pt Will Perform Eating: with set-up;sitting Pt Will Perform Grooming: with set-up;sitting Pt Will Perform Upper Body Bathing: with mod assist;sitting Pt Will Transfer to Toilet: with mod assist;stand pivot transfer;bedside commode Additional ADL Goal #1: Pt to complete bed mobility at Mod A in prep for ADL transfers Additional ADL Goal #2: Pt to demo ability to sit EOB >5-7 min during functional tasks with no more than min guard assist  OT Frequency: Min 2X/week   Barriers to D/C:  Co-evaluation PT/OT/SLP Co-Evaluation/Treatment: Yes Reason for Co-Treatment: Necessary to address cognition/behavior during functional activity;For patient/therapist safety;To address functional/ADL transfers PT goals addressed during session: Mobility/safety with mobility;Balance OT goals addressed during session: ADL's and self-care      AM-PAC OT "6 Clicks" Daily Activity     Outcome Measure Help from another person eating meals?: A Lot Help from another person taking care of personal grooming?: A Lot Help from another person toileting, which includes using toliet, bedpan, or urinal?: Total Help from another person bathing (including washing,  rinsing, drying)?: Total Help from another person to put on and taking off regular upper body clothing?: Total Help from another person to put on and taking off regular lower body clothing?: Total 6 Click Score: 8   End of Session Equipment Utilized During Treatment: Gait belt Nurse Communication: Mobility status  Activity Tolerance: Patient limited by lethargy Patient left: in bed;with bed alarm set;with call bell/phone within reach  OT Visit Diagnosis: Other abnormalities of gait and mobility (R26.89);Unsteadiness on feet (R26.81);Muscle weakness (generalized) (M62.81);Other symptoms and signs involving cognitive function                Time: 8841-6606 OT Time Calculation (min): 18 min Charges:  OT General Charges $OT Visit: 1 Visit OT Evaluation $OT Eval Moderate Complexity: 1 Mod  Bradd Canary, OTR/L Acute Rehab Services Office: [phone]   Lorre Munroe 10/31/2020, 11:30 AM

## 2020-10-31 NOTE — Progress Notes (Signed)
[facility]    Date of Admission:  10/29/2020   Total days of antibiotics 3           ID: Andrea Yu is a 80 y.o. female with Afib, HTN, and psoriatic arthritis who presented to [facility] after her PCP found her to have a leukocytosis on CBC and upon evaluation in the ED, was noted to be septic. 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 On 10/7, 1/4 blood cultures grew strep parasanguinus, unsure if true vs contaminant. Patient clinically deteriorated on the morning of 10/7 as well, as she is not able to communicate nor is she able to follow commands.    Principal Problem:   Severe sepsis (HCC) Active Problems:   Adrenal insufficiency (HCC)   PAF (paroxysmal atrial fibrillation) (HCC)   Psoriatic arthritis (HCC)   Iron deficiency anemia- transfused this admission   Depression   Peripheral neuropathy   Obesity (BMI 30-39.9)- suspected sleep apnea, declines sleep study   SAH (subarachnoid hemorrhage) (HCC)   Essential hypertension   GERD (gastroesophageal reflux disease)    Subjective: Upon evaluation the patient's clinical status is significantly changed when compared to the day prior. The patient is not alert and oriented, unable to follow commands at this time, and is now having difficulty speaking. She can say yes/no, but is unable to tell us her name, the year, or where she is at. The patient did spike a fever of 102.9 today  Medications:   aspirin EC  81 mg Oral QPM   diltiazem  120 mg Oral Daily   docusate sodium  100 mg Oral BID   DULoxetine  90 mg Oral Daily   enoxaparin (LOVENOX) injection  40 mg Subcutaneous Q24H   LORazepam  1 mg Intravenous Once   metoprolol tartrate  25 mg Oral BID   pantoprazole  40 mg Oral Daily    Objective: Vital signs in last 24  hours: Temp:  [98.2 F (36.8 C)-102.9 F (39.4 C)] 99 F (37.2 C) (10/07 1144) Pulse Rate:  [74-146] 107 (10/07 1144) Resp:  [16-24] 20 (10/07 1144) BP: (97-152)/(52-93) 115/85 (10/07 1144) SpO2:  [92 %-98 %] 97 % (10/07 1144)  General: Non communicative. No acute distress. Head: Normocephalic. Atraumatic. CV: RRR. No murmurs, rubs, or gallops. No LE edema Pulmonary: Lungs CTAB. Normal effort. No wheezing or rales. Abdominal: Soft, nontender, nondistended. Normal bowel sounds. Extremities: Palpable radial and DP pulses.  Skin: Warm and dry. No obvious rash or lesions. Neuro: Not alert or oriented. Unable to follow commands. Not able to communicate beyond yes/no.     Lab Results Recent Labs    10/29/20 2050 10/30/20 0558 10/31/20 0159  WBC 10.5  --  16.0*  HGB 9.9*  --  9.4*  HCT 31.9*  --  29.6*  NA  --  133* 133*  K  --  3.2* 3.4*  CL  --  100 103  CO2  --  20* 18*  BUN  --  14 10  CREATININE  --  1.06* 0.91   Liver Panel Recent Labs    10/29/20 1428 10/30/20 0558  PROT 6.7 5.2*  ALBUMIN 2.9* 2.4*  AST 22 21  ALT 19 18  ALKPHOS 76 61  BILITOT 0.9 1.0   Sedimentation  Rate No results for input(s): ESRSEDRATE in the last 72 hours. C-Reactive Protein No results for input(s): CRP in the last 72 hours.  Microbiology:  Studies/Results: CT ABDOMEN PELVIS W CONTRAST  Result Date: 10/29/2020 CLINICAL DATA:  Abdominal abscess or infection. EXAM: CT ABDOMEN AND PELVIS WITH CONTRAST TECHNIQUE: Multidetector CT imaging of the abdomen and pelvis was performed using the standard protocol following bolus administration of intravenous contrast. CONTRAST:  8mL OMNIPAQUE IOHEXOL 300 MG/ML  SOLN COMPARISON:  Jun 10, 2017. FINDINGS: Lower chest: No acute abnormality. Hepatobiliary: No focal liver abnormality is seen. Status post cholecystectomy. No biliary dilatation. Pancreas: Unremarkable. No pancreatic ductal dilatation or surrounding inflammatory changes. Spleen: Normal in  size without focal abnormality. Adrenals/Urinary Tract: Adrenal glands appear normal. Right renal cyst is noted. No hydronephrosis or renal obstruction is noted. Urinary bladder is unremarkable. Stomach/Bowel: Stomach is within normal limits. Appendix appears normal. No evidence of bowel wall thickening, distention, or inflammatory changes. Vascular/Lymphatic: Aortic atherosclerosis. No enlarged abdominal or pelvic lymph nodes. Reproductive: Status post hysterectomy. No adnexal masses. Other: 2 moderate size fat containing periumbilical hernias are noted. No ascites is noted. 6.1 x 3.3 cm fluid collection is seen in the inferior portion of the right buttocks region inferior to right ischial tuberosity. Musculoskeletal: No acute or significant osseous findings. IMPRESSION: 6.1 x 3.3 cm fluid collection is seen in the inferior portion of the right buttocks region inferior to right ischial tuberosity. This was present on prior exam. Potentially this may represent abscess. Two moderate size fat containing periumbilical hernias are noted. Aortic Atherosclerosis (ICD10-I70.0). Electronically Signed   By: Lupita Raider M.D.   On: 10/29/2020 16:40   DG Chest Port 1 View  Result Date: 10/29/2020 CLINICAL DATA:  Possible sepsis, dehydration. EXAM: PORTABLE CHEST 1 VIEW COMPARISON:  Jun 06, 2017. FINDINGS: The heart size and mediastinal contours are within normal limits. Both lungs are clear. The visualized skeletal structures are unremarkable. IMPRESSION: No active disease. Electronically Signed   By: Lupita Raider M.D.   On: 10/29/2020 15:10     Assessment/Plan: #Sepsis secondary to UTI #Questionable right gluteal abscess/fluid collection #Altered mental status Patient switched from aztreonam to ceftriaxone for UTI and questionable right gluteal abscess. Given the patient's fever of 102.9 and clinical deterioration this morning, would recommend MRI brain to rule out possible stroke.  - MRI Brain - Continue  Ceftriaxone 2 g IV everyday  - Repeat blood cultures  Andrea Yu [facility] Pager: [phone]  10/31/2020, 1:52 PM

## 2020-10-31 NOTE — Progress Notes (Addendum)
PROGRESS NOTE    MARGUERITA STAPP  NLZ:[id number] DOB: 16-Sep-1940 DOA: 10/29/2020 PCP: Jarome Matin, MD   Brief Narrative: 80 year old with past medical history significant for paroxysmal A. fib, GERD, neuropathy, morbid obesity, psoriatic arthritis, iron deficiency anemia, history of subarachnoid hemorrhage, hypertension who presents to the ED complaining of the urinary frequency and decreased oral intake.  Patient report having UTI and was prescribed Bactrim by her PCP.  She completed course and despite still continues to have dysuria.  Patient was noted to be slightly confused. Evaluation in the ED patient was tachycardic, hypotensive, tachypneic, febrile.  Leukocytosis white blood cell 15.3.  UA moderate leukocyte.  CT abdomen show fluid collection inferior portion of the right buttock region inferior to the right ischial tuberosity.    Assessment & Plan:   Principal Problem:   Severe sepsis (HCC) Active Problems:   Adrenal insufficiency (HCC)   PAF (paroxysmal atrial fibrillation) (HCC)   Psoriatic arthritis (HCC)   Iron deficiency anemia- transfused this admission   Depression   Peripheral neuropathy   Obesity (BMI 30-39.9)- suspected sleep apnea, declines sleep study   SAH (subarachnoid hemorrhage) (HCC)   Essential hypertension   GERD (gastroesophageal reflux disease)   1-sepsis secondary to UTI Severe sepsis ruled out .  Presented with fever tachycardia tachypnea and hypotension. Received IV fluids. On IV ceftriaxone.  Spike fever 102 today.  1-4 Blood culture positive for Streptococcus species. .  ID will follow up on patient.  WBC increase to 16.   2-question of right gluteal abscess: Present  with fever, hypotension concern for sepsis.  General surgery and ID has been consulted.  She had a prior history of abscess.  Per ID and surgery no need for drainage. Less likely abscess.   3-Acute Metabolic Encephalopathy;  Patient not very conversant, not following  command.  She was notice to be febrile.  Plan for MRI but patient decline MRI (became more alert and conversant per nurse) . Will proceed with CT scan.   A. fib with RVR: Cardiology consulted and following Started on oral Cardizem. Transiently overnight on Cardizem Gtt.   Hypertension: Continue with Cardizem  Generalized weakness: PT OT  Depression Continue with Cymbalta. HypoKalemia: Replete orally. Hyponatremia; received fluids.  Peripheral neuropathy: Continue with gabapentin   Pressure Injury 04/29/17 Stage II -  Partial thickness loss of dermis presenting as a shallow open ulcer with a red, pink wound bed without slough. healing callus, open pink wound bed (Active)  04/29/17 0800  Location: Heel  Location Orientation: Right  Staging: Stage II -  Partial thickness loss of dermis presenting as a shallow open ulcer with a red, pink wound bed without slough.  Wound Description (Comments): healing callus, open pink wound bed  Present on Admission:      Pressure Injury 10/30/20 Buttocks Right healed (Active)  10/30/20 2000  Location: Buttocks  Location Orientation: Right  Staging:   Wound Description (Comments): healed  Present on Admission: Yes                  Estimated body mass index is 36.96 kg/m as calculated from the following:   Height as of this encounter: 4\' 11"  (1.499 m).   Weight as of this encounter: 83 kg.   DVT prophylaxis: Lovenox Code Status: Full code Family Communication: Daughter over phone  Disposition Plan:  Status is: Inpatient  Remains inpatient appropriate because:IV treatments appropriate due to intensity of illness or inability to take PO  Dispo: The patient is  from: Home              Anticipated d/c is to: Home              Patient currently is not medically stable to d/c.   Difficult to place patient No        Consultants:  ID General surgery  Procedures:    Antimicrobials:  Aztreonam  Subjective: She is  more confuse, only said I am ok. She is less conversant,   Objective: Vitals:   10/30/20 2329 10/31/20 0301 10/31/20 0344 10/31/20 0759  BP: 107/87 (!) 121/54 (!) 106/52 (!) 152/93  Pulse: (!) 131 92 74 (!) 104  Resp: Temp: (!) 100.7 F (38.2 C) 98.9 F (37.2 C) 99.4 F (37.4 C) 98.2 F (36.8 C)  TempSrc: Oral Oral Oral Oral  SpO2: 96% 95% 98% 92%  Weight:      Height:        Intake/Output Summary (Last 24 hours) at 10/31/2020 1027 Last data filed at 10/31/2020 0600 Gross per 24 hour  Intake 1805.79 ml  Output 400 ml  Net 1405.79 ml    Filed Weights   10/30/20 0700  Weight: 83 kg    Examination:  General exam: NAD Respiratory system: CTA Cardiovascular system: S 1, S 2 IRR Gastrointestinal system: BS present, soft, nt Central nervous system: alert, not following command, more confuse.  Extremities: no edema    Data Reviewed: I have personally reviewed following labs and imaging studies  CBC: Recent Labs  Lab 10/29/20 1428 10/29/20 1629 10/29/20 2050 10/31/20 0159  WBC 15.3*  --  10.5 16.0*  NEUTROABS 12.1*  --   --   --   HGB 12.0 10.5* 9.9* 9.4*  HCT 38.1 31.0* 31.9* 29.6*  MCV 89.6  --  89.4 88.6  PLT 197  --  178 184    Basic Metabolic Panel: Recent Labs  Lab 10/29/20 1428 10/29/20 1629 10/29/20 1726 10/30/20 0558 10/31/20 0159  NA 133* 133*  --  133* 133*  K 5.1 4.9  --  3.2* 3.4*  CL 103 106  --  100 103  CO2 18*  --   --  20* 18*  GLUCOSE 105* 97  --  82 83  BUN 24* 23  --  14 10  CREATININE 1.22* 1.00 0.98 1.06* 0.91  CALCIUM 9.0  --   --  8.3* 8.1*    GFR: Estimated Creatinine Clearance: 45.2 mL/min (by C-G formula based on SCr of 0.91 mg/dL). Liver Function Tests: Recent Labs  Lab 10/29/20 1428 10/30/20 0558  AST 22 21  ALT 19 18  ALKPHOS 76 61  BILITOT 0.9 1.0  PROT 6.7 5.2*  ALBUMIN 2.9* 2.4*    No results for input(s): LIPASE, AMYLASE in the last 168 hours. No results for input(s): AMMONIA in the last  168 hours. Coagulation Profile: Recent Labs  Lab 10/29/20 1428 10/30/20 0558  INR 1.2 1.2    Cardiac Enzymes: No results for input(s): CKTOTAL, CKMB, CKMBINDEX, TROPONINI in the last 168 hours. BNP (last 3 results) No results for input(s): PROBNP in the last 8760 hours. HbA1C: No results for input(s): HGBA1C in the last 72 hours. CBG: No results for input(s): GLUCAP in the last 168 hours. Lipid Profile: No results for input(s): CHOL, HDL, LDLCALC, TRIG, CHOLHDL, LDLDIRECT in the last 72 hours. Thyroid Function Tests: No results for input(s): TSH, T4TOTAL, FREET4, T3FREE, THYROIDAB in the last 72 hours. Anemia Panel: No  results for input(s): VITAMINB12, FOLATE, FERRITIN, TIBC, IRON, RETICCTPCT in the last 72 hours. Sepsis Labs: Recent Labs  Lab 10/29/20 1428 10/29/20 1710 10/30/20 0558  PROCALCITON  --   --  1.20  LATICACIDVEN 1.0 1.8  --      Recent Results (from the past 240 hour(s))  Blood Culture (routine x 2)     Status: Abnormal (Preliminary result)   Collection Time: 10/29/20 10:45 AM   Specimen: BLOOD  Result Value Ref Range Status   Specimen Description BLOOD BLOOD RIGHT FOREARM  Final   Special Requests   Final    BOTTLES DRAWN AEROBIC AND ANAEROBIC Blood Culture adequate volume   Culture  Setup Time   Final    GRAM POSITIVE COCCI AEROBIC BOTTLE ONLY CRITICAL RESULT CALLED TO, READ BACK BY AND VERIFIED WITH: HURTH PHARMD @0920  10/31/20 EB    Culture (A)  Final    STREPTOCOCCUS PARASANGUINIS THE SIGNIFICANCE OF ISOLATING THIS ORGANISM FROM A SINGLE SET OF BLOOD CULTURES WHEN MULTIPLE SETS ARE DRAWN IS UNCERTAIN. PLEASE NOTIFY THE MICROBIOLOGY DEPARTMENT WITHIN ONE WEEK IF SPECIATION AND SENSITIVITIES ARE REQUIRED. Performed at [facility], [address] [place], [place]    Report Status PENDING  Incomplete  Blood Culture ID Panel (Reflexed)     Status: Abnormal   Collection Time: 10/29/20 10:45 AM  Result Value Ref Range Status    Enterococcus faecalis NOT DETECTED NOT DETECTED Final   Enterococcus Faecium NOT DETECTED NOT DETECTED Final   Listeria monocytogenes NOT DETECTED NOT DETECTED Final   Staphylococcus species NOT DETECTED NOT DETECTED Final   Staphylococcus aureus (BCID) NOT DETECTED NOT DETECTED Final   Staphylococcus epidermidis NOT DETECTED NOT DETECTED Final   Staphylococcus lugdunensis NOT DETECTED NOT DETECTED Final   Streptococcus species DETECTED (A) NOT DETECTED Final    Comment: Not Enterococcus species, Streptococcus agalactiae, Streptococcus pyogenes, or Streptococcus pneumoniae. CRITICAL RESULT CALLED TO, READ BACK BY AND VERIFIED WITH: HURTH PHARMD @0920  10/31/20 EB    Streptococcus agalactiae NOT DETECTED NOT DETECTED Final   Streptococcus pneumoniae NOT DETECTED NOT DETECTED Final   Streptococcus pyogenes NOT DETECTED NOT DETECTED Final   A.calcoaceticus-baumannii NOT DETECTED NOT DETECTED Final   Bacteroides fragilis NOT DETECTED NOT DETECTED Final   Enterobacterales NOT DETECTED NOT DETECTED Final   Enterobacter cloacae complex NOT DETECTED NOT DETECTED Final   Escherichia coli NOT DETECTED NOT DETECTED Final   Klebsiella aerogenes NOT DETECTED NOT DETECTED Final   Klebsiella oxytoca NOT DETECTED NOT DETECTED Final   Klebsiella pneumoniae NOT DETECTED NOT DETECTED Final   Proteus species NOT DETECTED NOT DETECTED Final   Salmonella species NOT DETECTED NOT DETECTED Final   Serratia marcescens NOT DETECTED NOT DETECTED Final   Haemophilus influenzae NOT DETECTED NOT DETECTED Final   Neisseria meningitidis NOT DETECTED NOT DETECTED Final   Pseudomonas aeruginosa NOT DETECTED NOT DETECTED Final   Stenotrophomonas maltophilia NOT DETECTED NOT DETECTED Final   Candida albicans NOT DETECTED NOT DETECTED Final   Candida auris NOT DETECTED NOT DETECTED Final   Candida glabrata NOT DETECTED NOT DETECTED Final   Candida krusei NOT DETECTED NOT DETECTED Final   Candida parapsilosis NOT DETECTED  NOT DETECTED Final   Candida tropicalis NOT DETECTED NOT DETECTED Final   Cryptococcus neoformans/gattii NOT DETECTED NOT DETECTED Final    Comment: Performed at [facility], [address], [place], [address] [place]  Resp Panel by RT-PCR (Flu A&B, Covid)     Status: None  Collection Time: 10/29/20  2:22 PM   Specimen: Nasopharyngeal(NP) swabs in vial transport medium  Result Value Ref Range Status   SARS Coronavirus 2 by RT PCR NEGATIVE NEGATIVE Final    Comment: (NOTE) SARS-CoV-2 target nucleic acids are NOT DETECTED.  The SARS-CoV-2 RNA is generally detectable in upper respiratory specimens during the acute phase of infection. The lowest concentration of SARS-CoV-2 viral copies this assay can detect is 138 copies/mL. A negative result does not preclude SARS-Cov-2 infection and should not be used as the sole basis for treatment or other patient management decisions. A negative result may occur with  improper specimen collection/handling, submission of specimen other than nasopharyngeal swab, presence of viral mutation(s) within the areas targeted by this assay, and inadequate number of viral copies(<138 copies/mL). A negative result must be combined with clinical observations, patient history, and epidemiological information. The expected result is Negative.  Fact Sheet for Patients:  BloggerCourse.com  Fact Sheet for Healthcare Providers:  SeriousBroker.it  This test is no t yet approved or cleared by the [place] FDA and  has been authorized for detection and/or diagnosis of SARS-CoV-2 by FDA under an Emergency Use Authorization (EUA). This EUA will remain  in effect (meaning this test can be used) for the duration of the COVID-19 declaration under Section 564(b)(1) of the Act, 21 U.S.C.section 360bbb-3(b)(1), unless the authorization is terminated  or revoked sooner.       Influenza A by PCR NEGATIVE NEGATIVE  Final   Influenza B by PCR NEGATIVE NEGATIVE Final    Comment: (NOTE) The Xpert Xpress SARS-CoV-2/FLU/RSV plus assay is intended as an aid in the diagnosis of influenza from Nasopharyngeal swab specimens and should not be used as a sole basis for treatment. Nasal washings and aspirates are unacceptable for Xpert Xpress SARS-CoV-2/FLU/RSV testing.  Fact Sheet for Patients: BloggerCourse.com  Fact Sheet for Healthcare Providers: SeriousBroker.it  This test is not yet approved or cleared by the [place] FDA and has been authorized for detection and/or diagnosis of SARS-CoV-2 by FDA under an Emergency Use Authorization (EUA). This EUA will remain in effect (meaning this test can be used) for the duration of the COVID-19 declaration under Section 564(b)(1) of the Act, 21 U.S.C. section 360bbb-3(b)(1), unless the authorization is terminated or revoked.  Performed at [facility], [address], [place], [place] [address]   Blood Culture (routine x 2)     Status: None (Preliminary result)   Collection Time: 10/29/20  2:28 PM   Specimen: BLOOD  Result Value Ref Range Status   Specimen Description BLOOD SITE NOT SPECIFIED  Final   Special Requests   Final    BOTTLES DRAWN AEROBIC AND ANAEROBIC Blood Culture adequate volume   Culture   Final    NO GROWTH < 24 HOURS Performed at [facility], [address], [place], [place] [address]    Report Status PENDING  Incomplete  Urine Culture     Status: Abnormal   Collection Time: 10/29/20  3:45 PM   Specimen: In/Out Cath Urine  Result Value Ref Range Status   Specimen Description IN/OUT CATH URINE  Final   Special Requests   Final    NONE Performed at [facility], [address], [place], [place] [address]    Culture MULTIPLE SPECIES PRESENT, SUGGEST RECOLLECTION (A)  Final   Report Status 10/30/2020 FINAL  Final          Radiology Studies: CT ABDOMEN  PELVIS W  CONTRAST  Result Date: 10/29/2020 CLINICAL DATA:  Abdominal abscess or infection. EXAM: CT ABDOMEN AND PELVIS WITH CONTRAST TECHNIQUE: Multidetector CT imaging of the abdomen and pelvis was performed using the standard protocol following bolus administration of intravenous contrast. CONTRAST:  80mL OMNIPAQUE IOHEXOL 300 MG/ML  SOLN COMPARISON:  Jun 10, 2017. FINDINGS: Lower chest: No acute abnormality. Hepatobiliary: No focal liver abnormality is seen. Status post cholecystectomy. No biliary dilatation. Pancreas: Unremarkable. No pancreatic ductal dilatation or surrounding inflammatory changes. Spleen: Normal in size without focal abnormality. Adrenals/Urinary Tract: Adrenal glands appear normal. Right renal cyst is noted. No hydronephrosis or renal obstruction is noted. Urinary bladder is unremarkable. Stomach/Bowel: Stomach is within normal limits. Appendix appears normal. No evidence of bowel wall thickening, distention, or inflammatory changes. Vascular/Lymphatic: Aortic atherosclerosis. No enlarged abdominal or pelvic lymph nodes. Reproductive: Status post hysterectomy. No adnexal masses. Other: 2 moderate size fat containing periumbilical hernias are noted. No ascites is noted. 6.1 x 3.3 cm fluid collection is seen in the inferior portion of the right buttocks region inferior to right ischial tuberosity. Musculoskeletal: No acute or significant osseous findings. IMPRESSION: 6.1 x 3.3 cm fluid collection is seen in the inferior portion of the right buttocks region inferior to right ischial tuberosity. This was present on prior exam. Potentially this may represent abscess. Two moderate size fat containing periumbilical hernias are noted. Aortic Atherosclerosis (ICD10-I70.0). Electronically Signed   By: Lupita Raider M.D.   On: 10/29/2020 16:40   DG Chest Port 1 View  Result Date: 10/29/2020 CLINICAL DATA:  Possible sepsis, dehydration. EXAM: PORTABLE CHEST 1 VIEW COMPARISON:  Jun 06, 2017.  FINDINGS: The heart size and mediastinal contours are within normal limits. Both lungs are clear. The visualized skeletal structures are unremarkable. IMPRESSION: No active disease. Electronically Signed   By: Lupita Raider M.D.   On: 10/29/2020 15:10        Scheduled Meds:  aspirin EC  81 mg Oral QPM   diltiazem  120 mg Oral Daily   docusate sodium  100 mg Oral BID   DULoxetine  90 mg Oral Daily   enoxaparin (LOVENOX) injection  30 mg Subcutaneous Q24H   LORazepam  1 mg Intravenous Once   metoprolol tartrate  25 mg Oral BID   pantoprazole  40 mg Oral Daily   potassium chloride  40 mEq Oral Once   Continuous Infusions:  cefTRIAXone (ROCEPHIN)  IV     lactated ringers 75 mL/hr at 10/30/20 2027     LOS: 2 days    Time spent: 35 minutes.     Alba Cory, MD [facility]   If 7PM-7AM, please contact night-coverage [email or web address]  10/31/2020, 10:27 AM

## 2020-11-01 ENCOUNTER — Inpatient Hospital Stay (HOSPITAL_COMMUNITY): Payer: Medicare Other

## 2020-11-01 DIAGNOSIS — A419 Sepsis, unspecified organism: Secondary | ICD-10-CM | POA: Diagnosis not present

## 2020-11-01 DIAGNOSIS — R6520 Severe sepsis without septic shock: Secondary | ICD-10-CM | POA: Diagnosis not present

## 2020-11-01 DIAGNOSIS — I4811 Longstanding persistent atrial fibrillation: Secondary | ICD-10-CM | POA: Diagnosis not present

## 2020-11-01 DIAGNOSIS — R7881 Bacteremia: Secondary | ICD-10-CM

## 2020-11-01 LAB — BASIC METABOLIC PANEL
Anion gap: 12 (ref 5–15)
BUN: 9 mg/dL (ref 8–23)
CO2: 18 mmol/L — ABNORMAL LOW (ref 22–32)
Calcium: 8 mg/dL — ABNORMAL LOW (ref 8.9–10.3)
Chloride: 101 mmol/L (ref 98–111)
Creatinine, Ser: 0.96 mg/dL (ref 0.44–1.00)
GFR, Estimated: 59 mL/min — ABNORMAL LOW (ref >60)
Glucose, Bld: 78 mg/dL (ref 70–99)
Potassium: 3.5 mmol/L (ref 3.5–5.1)
Sodium: 131 mmol/L — ABNORMAL LOW (ref 135–145)

## 2020-11-01 LAB — CBC
HCT: 30 % — ABNORMAL LOW (ref 36.0–46.0)
Hemoglobin: 9.7 g/dL — ABNORMAL LOW (ref 12.0–15.0)
MCH: 28 pg (ref 26.0–34.0)
MCHC: 32.3 g/dL (ref 30.0–36.0)
MCV: 86.5 fL (ref 80.0–100.0)
Platelets: 217 10*3/uL (ref 150–400)
RBC: 3.47 MIL/uL — ABNORMAL LOW (ref 3.87–5.11)
RDW: 15 % (ref 11.5–15.5)
WBC: 14.7 10*3/uL — ABNORMAL HIGH (ref 4.0–10.5)
nRBC: 0 % (ref 0.0–0.2)

## 2020-11-01 LAB — ECHOCARDIOGRAM COMPLETE
Height: 59 in
P 1/2 time: 748 msec
S' Lateral: 2.4 cm
Weight: 2927.71 oz

## 2020-11-01 NOTE — Progress Notes (Signed)
  Echocardiogram 2D Echocardiogram has been performed.  Augustine Radar 11/01/2020, 9:52 AM

## 2020-11-01 NOTE — Progress Notes (Signed)
Progress Note   Subjective   Remains ill,  afib V rates are controlled.  Wakes but not very interactive this am.  Inpatient Medications    Scheduled Meds:  aspirin EC  81 mg Oral QPM   diltiazem  120 mg Oral Daily   docusate sodium  100 mg Oral BID   DULoxetine  90 mg Oral Daily   enoxaparin (LOVENOX) injection  40 mg Subcutaneous Q24H   LORazepam  1 mg Intravenous Once   metoprolol tartrate  25 mg Oral BID   pantoprazole  40 mg Oral Daily   Continuous Infusions:  cefTRIAXone (ROCEPHIN)  IV 2 g (10/31/20 1241)   lactated ringers 75 mL/hr at 10/30/20 2027   PRN Meds: acetaminophen **OR** acetaminophen, HYDROcodone-acetaminophen, ibuprofen, ondansetron **OR** ondansetron (ZOFRAN) IV   Vital Signs    Vitals:   10/31/20 1945 10/31/20 2310 11/01/20 0332 11/01/20 0758  BP: 117/60 (!) 108/57 127/62 114/64  Pulse: 68 68 94 87  Resp: 20 20 20    Temp: 98.5 F (36.9 C) 99 F (37.2 C) 99.8 F (37.7 C) 98.2 F (36.8 C)  TempSrc: Oral Oral Oral Oral  SpO2: 97% 94% 98% 98%  Weight:      Height:        Intake/Output Summary (Last 24 hours) at 11/01/2020 01/01/2021 Last data filed at 11/01/2020 0536 Gross per 24 hour  Intake 912.44 ml  Output 700 ml  Net 212.44 ml   Filed Weights   10/30/20 0700  Weight: 83 kg    Telemetry    Afib, V rates 80s-90s, - Personally Reviewed  Physical Exam   GEN- The patient is elderly and chronically ill appearing, awakes but seems confused Head- normocephalic, atraumatic Eyes-  Sclera clear, conjunctiva pink Ears- hearing intact Oropharynx- clear Neck- supple, Lungs-  normal work of breathing Heart- irregular rate and rhythm  GI- soft  Extremities- no clubbing, cyanosis, + dependant edema  MS- diffuse atrophy Psych- confused   Labs    Chemistry Recent Labs  Lab 10/29/20 1428 10/29/20 1629 10/29/20 1726 10/30/20 0558 10/31/20 0159  NA 133* 133*  --  133* 133*  K 5.1 4.9  --  3.2* 3.4*  CL 103 106  --  100 103  CO2 18*   --   --  20* 18*  GLUCOSE 105* 97  --  82 83  BUN 24* 23  --  14 10  CREATININE 1.22* 1.00 0.98 1.06* 0.91  CALCIUM 9.0  --   --  8.3* 8.1*  PROT 6.7  --   --  5.2*  --   ALBUMIN 2.9*  --   --  2.4*  --   AST 22  --   --  21  --   ALT 19  --   --  18  --   ALKPHOS 76  --   --  61  --   BILITOT 0.9  --   --  1.0  --   GFRNONAA 45*  --  58* 53* >60  ANIONGAP 12  --   --  13 12     Hematology Recent Labs  Lab 10/29/20 1428 10/29/20 1629 10/29/20 2050 10/31/20 0159  WBC 15.3*  --  10.5 16.0*  RBC 4.25  --  3.57* 3.34*  HGB 12.0 10.5* 9.9* 9.4*  HCT 38.1 31.0* 31.9* 29.6*  MCV 89.6  --  89.4 88.6  MCH 28.2  --  27.7 28.1  MCHC 31.5  --  31.0 31.8  RDW 15.4  --  15.0 15.0  PLT 197  --  178 184     Patient ID  Andrea Yu is a 80 y.o. female with a hx of atrail fibrllation who is being seen 10/29/2020 for the evaluation of afib with RVR  at the request of Dr Lucianne Muss.  Assessment & Plan    1.  Longstanding persistent afib Rate controlled currently Not felt to be a candidate for [facility] given prior SDH and falls.  Chads2vasc score is at least 4.  Currently rate controlled with oral medicines  Cardiology to see as needed this weekend.  Hillis Range MD, [facility] 11/01/2020 8:28 AM

## 2020-11-01 NOTE — Progress Notes (Signed)
[facility]    Date of Admission:  10/29/2020      ID: Andrea Yu is a 80 y.o. female with streptococcal bacteremia Principal Problem:   Severe sepsis (HCC) Active Problems:   Adrenal insufficiency (HCC)   PAF (paroxysmal atrial fibrillation) (HCC)   Psoriatic arthritis (HCC)   Iron deficiency anemia- transfused this admission   Depression   Peripheral neuropathy   Obesity (BMI 30-39.9)- suspected sleep apnea, declines sleep study   SAH (subarachnoid hemorrhage) (HCC)   Essential hypertension   GERD (gastroesophageal reflux disease)    Subjective: Afebrile. Still having difficulty with pain while working with PT/OT  Medications:   aspirin EC  81 mg Oral QPM   diltiazem  120 mg Oral Daily   docusate sodium  100 mg Oral BID   DULoxetine  90 mg Oral Daily   enoxaparin (LOVENOX) injection  40 mg Subcutaneous Q24H   LORazepam  1 mg Intravenous Once   metoprolol tartrate  25 mg Oral BID   pantoprazole  40 mg Oral Daily    Objective: Vital signs in last 24 hours: Temp:  [98 F (36.7 C)-99.8 F (37.7 C)] 98 F (36.7 C) (10/08 1214) Pulse Rate:  [68-94] 75 (10/08 1214) Resp:  [18-20] 18 (10/08 1214) BP: (97-127)/(56-80) 99/56 (10/08 1214) SpO2:  [94 %-100 %] 100 % (10/08 1214) Physical Exam  Constitutional:  oriented to person, only. appears well-developed and well-nourished. No distress.  HENT: [place]/AT, PERRLA, no scleral icterus Mouth/Throat: Oropharynx is clear and moist. No oropharyngeal exudate.  Cardiovascular: Normal rate, regular rhythm and normal heart sounds. Exam reveals no gallop and no friction rub.  No murmur heard.  Pulmonary/Chest: [place] normal and breath sounds normal. No respiratory distress.  has no wheezes.  Neck = supple, no nuchal rigidity Abdominal: Soft. Bowel sounds are normal.  exhibits no distension. There is no tenderness.  Lymphadenopathy: no cervical adenopathy. No axillary adenopathy Ext: +1 edema Skin:  Skin is warm and dry. No rash noted. No erythema.  Psychiatric: a normal mood and affect.  behavior is normal.    Lab Results Recent Labs    10/31/20 0159 11/01/20 0753  WBC 16.0* 14.7*  HGB 9.4* 9.7*  HCT 29.6* 30.0*  NA 133* 131*  K 3.4* 3.5  CL 103 101  CO2 18* 18*  BUN 10 9  CREATININE 0.91 0.96   Liver Panel Recent Labs    10/30/20 0558  PROT 5.2*  ALBUMIN 2.4*  AST 21  ALT 18  ALKPHOS 61  BILITOT 1.0   Sedimentation Rate No results for input(s): ESRSEDRATE in the last 72 hours. C-Reactive Protein No results for input(s): CRP in the last 72 hours.  Microbiology:  Studies/Results: CT HEAD WO CONTRAST ( )  Result Date: 10/31/2020 CLINICAL DATA:  Delirium EXAM: CT HEAD WITHOUT CONTRAST TECHNIQUE: Contiguous axial images were obtained from the base of the skull through the vertex without intravenous contrast. COMPARISON:  06/07/2017 FINDINGS: Brain: Normal anatomic configuration. Parenchymal volume loss is commensurate with the patient's age. Mild periventricular white matter changes are present likely reflecting the sequela of small vessel ischemia. 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 No abnormal intra or extra-axial mass lesion or fluid collection. No abnormal mass effect or midline shift. No evidence of acute intracranial hemorrhage or infarct. Ventricular size is normal. Cerebellum unremarkable. Vascular: No asymmetric hyperdense vasculature at the skull base. Advanced atherosclerotic calcification is noted within the carotid siphons. Skull: Intact Sinuses/Orbits:  Paranasal sinuses are clear. Orbits are unremarkable. Other: Mastoid air cells and middle ear cavities are clear. IMPRESSION: Stable senescent change. No acute intracranial hemorrhage or infarct. Electronically Signed   By: Helyn Numbers M.D.   On: 10/31/2020 22:01   ECHOCARDIOGRAM COMPLETE  Result Date: 11/01/2020    ECHOCARDIOGRAM REPORT   Patient Name:   Andrea Yu Date of Exam: 11/01/2020 Medical Rec #:  [id number]     Height:       59.0 in Accession #:    [id number]    Weight:       183.0 lb Date of Birth:  11-07-1940    BSA:          1.776 m Patient Age:    79 years      BP:           127/62 mmHg Patient Gender: F             HR:           81 bpm. Exam Location:  Inpatient Procedure: 2D Echo, Cardiac Doppler and Color Doppler Indications:    Bacteremia  History:        Patient has prior history of Echocardiogram examinations, most                 recent 04/19/2017. Arrythmias:Atrial Fibrillation; Risk                 Factors:Hypertension.  Sonographer:    Eulah Pont RDCS Referring Phys: Encarnacion Chu COMER IMPRESSIONS  1. Left ventricular ejection fraction, by estimation, is 60 to 65%. The left ventricle has normal function. The left ventricle has no regional wall motion abnormalities. Left ventricular diastolic function could not be evaluated.  2. Right ventricular systolic function is normal. The right ventricular size is normal. There is mildly elevated pulmonary artery systolic pressure.  3. Left atrial size was mildly dilated.  4. The mitral valve is normal in structure. Trivial mitral valve regurgitation.  5. The aortic valve is normal in structure. Aortic valve regurgitation is mild. Mild aortic valve sclerosis is present, with no evidence of aortic valve stenosis.  6. The inferior vena cava is dilated in size with >50% respiratory variability, suggesting right atrial pressure of 8 mmHg. Conclusion(s)/Recommendation(s): No evidence of valvular vegetations on this transthoracic echocardiogram. Would recommend a transesophageal echocardiogram to exclude infective endocarditis if clinically indicated. FINDINGS  Left Ventricle: Left ventricular ejection fraction, by estimation, is 60 to 65%. The left ventricle has normal function. The left ventricle has no regional wall motion abnormalities. The left ventricular internal cavity size was normal in size. There is   no left ventricular hypertrophy. Left ventricular diastolic function could not be evaluated due to atrial fibrillation. Left ventricular diastolic function could not be evaluated. Right Ventricle: The right ventricular size is normal. No increase in right ventricular wall thickness. Right ventricular systolic function is normal. There is mildly elevated pulmonary artery systolic pressure. The tricuspid regurgitant velocity is 2.73  m/s, and with an assumed right atrial pressure of 8 mmHg, the estimated right ventricular systolic pressure is 37.8 mmHg. Left Atrium: Left atrial size was mildly dilated. Right Atrium: Right atrial size was normal in size. Pericardium: There is no evidence of pericardial effusion. Mitral Valve: The mitral valve is normal in structure. Mild mitral annular calcification. Trivial mitral valve regurgitation. Tricuspid Valve: The tricuspid valve is normal in structure. Tricuspid valve regurgitation is mild. Aortic Valve: The aortic valve is normal in structure.  Aortic valve regurgitation is mild. Aortic regurgitation PHT measures 748 msec. Mild aortic valve sclerosis is present, with no evidence of aortic valve stenosis. Pulmonic Valve: The pulmonic valve was normal in structure. Pulmonic valve regurgitation is not visualized. Aorta: The aortic root and ascending aorta are structurally normal, with no evidence of dilitation. Venous: The inferior vena cava is dilated in size with greater than 50% respiratory variability, suggesting right atrial pressure of 8 mmHg. IAS/Shunts: No atrial level shunt detected by color flow Doppler.  LEFT VENTRICLE PLAX 2D LVIDd:         3.70 cm LVIDs:         2.40 cm LV PW:         1.10 cm LV IVS:        1.00 cm LVOT diam:     1.80 cm LV SV:         48 LV SV Index:   27 LVOT Area:     2.54 cm  RIGHT VENTRICLE TAPSE (M-mode): 1.9 cm LEFT ATRIUM           Index        RIGHT ATRIUM           Index LA diam:      4.20 cm 2.36 cm/m   RA Area:     13.00 cm LA Vol  (A2C): 51.6 ml 29.05 ml/m  RA Volume:   31.20 ml  17.57 ml/m LA Vol (A4C): 88.0 ml 49.56 ml/m  AORTIC VALVE LVOT Vmax:   113.00 cm/s LVOT Vmean:  73.300 cm/s LVOT VTI:    0.190 m AI PHT:      748 msec  AORTA Ao Asc diam: 3.10 cm TRICUSPID VALVE TR Peak grad:   29.8 mmHg TR Vmax:        273.00 cm/s  SHUNTS Systemic VTI:  0.19 m Systemic Diam: 1.80 cm Mihai Croitoru MD Electronically signed by Thurmon Fair MD Signature Date/Time: 11/01/2020/1:18:45 PM    Final      Assessment/Plan: Streptococcal bacteremia = continue with ceftriaxone 2gm IV daily for the time being, awaiting sensitivities  Rule out CNS septic emboli = MRI still pending.   [facility] Pager: [phone]  11/01/2020, 3:27 PM

## 2020-11-01 NOTE — Progress Notes (Signed)
PROGRESS NOTE    Andrea Yu  BJS:[id number] DOB: Oct 19, 1940 DOA: 10/29/2020 PCP: Jarome Matin, MD   Brief Narrative: 80 year old with past medical history significant for paroxysmal A. fib, GERD, neuropathy, morbid obesity, psoriatic arthritis, iron deficiency anemia, history of subarachnoid hemorrhage, hypertension who presents to the ED complaining of the urinary frequency and decreased oral intake.  Patient report having UTI and was prescribed Bactrim by her PCP.  She completed course and despite still continues to have dysuria.  Patient was noted to be slightly confused. Evaluation in the ED patient was tachycardic, hypotensive, tachypneic, febrile.  Leukocytosis white blood cell 15.3.  UA moderate leukocyte.  CT abdomen show fluid collection inferior portion of the right buttock region inferior to the right ischial tuberosity.    Assessment & Plan:   Principal Problem:   Severe sepsis (HCC) Active Problems:   Adrenal insufficiency (HCC)   PAF (paroxysmal atrial fibrillation) (HCC)   Psoriatic arthritis (HCC)   Iron deficiency anemia- transfused this admission   Depression   Peripheral neuropathy   Obesity (BMI 30-39.9)- suspected sleep apnea, declines sleep study   SAH (subarachnoid hemorrhage) (HCC)   Essential hypertension   GERD (gastroesophageal reflux disease)   1-Sepsis secondary to UTI Severe sepsis ruled out .  Presented with fever tachycardia tachypnea and hypotension. Received IV fluids. Continue with  IV ceftriaxone.  Spike fever 102  10/07 1-4 Blood culture positive for Streptococcus species. .  WBC down to 14.  Repeated Blood culture 10/07: pending.   2-question of right gluteal abscess: Present  with fever, hypotension concern for sepsis.  General surgery and ID has been consulted.  She had a prior history of abscess.  Per ID and surgery no need for drainage. Less likely abscess.   3-Acute Metabolic Encephalopathy;  Patient not very conversant,  not following command.  She was notice to be febrile.  Plan for MRI but patient decline MRI.  CT head no acute intracranial pathology.   A. fib with RVR: Cardiology consulted and following Started on oral Cardizem. Transiently overnight on Cardizem Gtt.   Hypertension: Continue with Cardizem  Generalized weakness: PT OT  Depression Continue with Cymbalta. HypoKalemia: Replaced.  Hyponatremia; received fluids.  Peripheral neuropathy: Continue with gabapentin   Pressure Injury 04/29/17 Stage II -  Partial thickness loss of dermis presenting as a shallow open ulcer with a red, pink wound bed without slough. healing callus, open pink wound bed (Active)  04/29/17 0800  Location: Heel  Location Orientation: Right  Staging: Stage II -  Partial thickness loss of dermis presenting as a shallow open ulcer with a red, pink wound bed without slough.  Wound Description (Comments): healing callus, open pink wound bed  Present on Admission:      Pressure Injury 10/30/20 Buttocks Right healed (Active)  10/30/20 2000  Location: Buttocks  Location Orientation: Right  Staging:   Wound Description (Comments): healed  Present on Admission: Yes        Estimated body mass index is 36.96 kg/m as calculated from the following:   Height as of this encounter: 4\' 11"  (1.499 m).   Weight as of this encounter: 83 kg.   DVT prophylaxis: Lovenox Code Status: Full code Family Communication: Daughter over phone 10/07 Disposition Plan:  Status is: Inpatient  Remains inpatient appropriate because:IV treatments appropriate due to intensity of illness or inability to take PO  Dispo: The patient is from: Home  Anticipated d/c is to: Home              Patient currently is not medically stable to d/c.   Difficult to place patient No        Consultants:  ID General surgery  Procedures:    Antimicrobials:  Aztreonam  Subjective: She is sleepy, wake up, said no to MRI    Objective: Vitals:   10/31/20 2310 11/01/20 0332 11/01/20 0758 11/01/20 1214  BP: (!) 108/57 127/62 114/64 (!) 99/56  Pulse: 68 94 87 75  Resp: 20 20  18   Temp: 99 F (37.2 C) 99.8 F (37.7 C) 98.2 F (36.8 C) 98 F (36.7 C)  TempSrc: Oral Oral Oral Oral  SpO2: 94% 98% 98% 100%  Weight:      Height:        Intake/Output Summary (Last 24 hours) at 11/01/2020 1317 Last data filed at 11/01/2020 0536 Gross per 24 hour  Intake 912.44 ml  Output 200 ml  Net 712.44 ml    Filed Weights   10/30/20 0700  Weight: 83 kg    Examination:  General exam: NAD,  Respiratory system: CTA Cardiovascular system: S 1, S 2 RRR Gastrointestinal system: BBS present, soft, nt Central nervous system: sleepy  Extremities: no edema    Data Reviewed: I have personally reviewed following labs and imaging studies  CBC: Recent Labs  Lab 10/29/20 1428 10/29/20 1629 10/29/20 2050 10/31/20 0159 11/01/20 0753  WBC 15.3*  --  10.5 16.0* 14.7*  NEUTROABS 12.1*  --   --   --   --   HGB 12.0 10.5* 9.9* 9.4* 9.7*  HCT 38.1 31.0* 31.9* 29.6* 30.0*  MCV 89.6  --  89.4 88.6 86.5  PLT 197  --  178 184 217    Basic Metabolic Panel: Recent Labs  Lab 10/29/20 1428 10/29/20 1629 10/29/20 1726 10/30/20 0558 10/31/20 0159 11/01/20 0753  NA 133* 133*  --  133* 133* 131*  K 5.1 4.9  --  3.2* 3.4* 3.5  CL 103 106  --  100 103 101  CO2 18*  --   --  20* 18* 18*  GLUCOSE 105* 97  --  82 83 78  BUN 24* 23  --  14 10 9   CREATININE 1.22* 1.00 0.98 1.06* 0.91 0.96  CALCIUM 9.0  --   --  8.3* 8.1* 8.0*    GFR: Estimated Creatinine Clearance: 42.9 mL/min (by C-G formula based on SCr of 0.96 mg/dL). Liver Function Tests: Recent Labs  Lab 10/29/20 1428 10/30/20 0558  AST 22 21  ALT 19 18  ALKPHOS 76 61  BILITOT 0.9 1.0  PROT 6.7 5.2*  ALBUMIN 2.9* 2.4*    No results for input(s): LIPASE, AMYLASE in the last 168 hours. No results for input(s): AMMONIA in the last 168 hours. Coagulation  Profile: Recent Labs  Lab 10/29/20 1428 10/30/20 0558  INR 1.2 1.2    Cardiac Enzymes: No results for input(s): CKTOTAL, CKMB, CKMBINDEX, TROPONINI in the last 168 hours. BNP (last 3 results) No results for input(s): PROBNP in the last 8760 hours. HbA1C: No results for input(s): HGBA1C in the last 72 hours. CBG: No results for input(s): GLUCAP in the last 168 hours. Lipid Profile: No results for input(s): CHOL, HDL, LDLCALC, TRIG, CHOLHDL, LDLDIRECT in the last 72 hours. Thyroid Function Tests: No results for input(s): TSH, T4TOTAL, FREET4, T3FREE, THYROIDAB in the last 72 hours. Anemia Panel: No results for input(s): VITAMINB12, FOLATE, FERRITIN, TIBC,  IRON, RETICCTPCT in the last 72 hours. Sepsis Labs: Recent Labs  Lab 10/29/20 1428 10/29/20 1710 10/30/20 0558  PROCALCITON  --   --  1.20  LATICACIDVEN 1.0 1.8  --      Recent Results (from the past 240 hour(s))  Blood Culture (routine x 2)     Status: Abnormal   Collection Time: 10/29/20 10:45 AM   Specimen: BLOOD  Result Value Ref Range Status   Specimen Description BLOOD BLOOD RIGHT FOREARM  Final   Special Requests   Final    BOTTLES DRAWN AEROBIC AND ANAEROBIC Blood Culture adequate volume   Culture  Setup Time   Final    GRAM POSITIVE COCCI AEROBIC BOTTLE ONLY CRITICAL RESULT CALLED TO, READ BACK BY AND VERIFIED WITH: HURTH PHARMD @0920  10/31/20 EB    Culture (A)  Final    STREPTOCOCCUS PARASANGUINIS THE SIGNIFICANCE OF ISOLATING THIS ORGANISM FROM A SINGLE SET OF BLOOD CULTURES WHEN MULTIPLE SETS ARE DRAWN IS UNCERTAIN. PLEASE NOTIFY THE MICROBIOLOGY DEPARTMENT WITHIN ONE WEEK IF SPECIATION AND SENSITIVITIES ARE REQUIRED. Performed at [facility], [address], [place], [place]    Report Status 11/01/2020 FINAL  Final  Blood Culture ID Panel (Reflexed)     Status: Abnormal   Collection Time: 10/29/20 10:45 AM  Result Value Ref Range Status   Enterococcus faecalis NOT DETECTED NOT  DETECTED Final   Enterococcus Faecium NOT DETECTED NOT DETECTED Final   Listeria monocytogenes NOT DETECTED NOT DETECTED Final   Staphylococcus species NOT DETECTED NOT DETECTED Final   Staphylococcus aureus (BCID) NOT DETECTED NOT DETECTED Final   Staphylococcus epidermidis NOT DETECTED NOT DETECTED Final   Staphylococcus lugdunensis NOT DETECTED NOT DETECTED Final   Streptococcus species DETECTED (A) NOT DETECTED Final    Comment: Not Enterococcus species, Streptococcus agalactiae, Streptococcus pyogenes, or Streptococcus pneumoniae. CRITICAL RESULT CALLED TO, READ BACK BY AND VERIFIED WITH: HURTH PHARMD @0920  10/31/20 EB    Streptococcus agalactiae NOT DETECTED NOT DETECTED Final   Streptococcus pneumoniae NOT DETECTED NOT DETECTED Final   Streptococcus pyogenes NOT DETECTED NOT DETECTED Final   A.calcoaceticus-baumannii NOT DETECTED NOT DETECTED Final   Bacteroides fragilis NOT DETECTED NOT DETECTED Final   Enterobacterales NOT DETECTED NOT DETECTED Final   Enterobacter cloacae complex NOT DETECTED NOT DETECTED Final   Escherichia coli NOT DETECTED NOT DETECTED Final   Klebsiella aerogenes NOT DETECTED NOT DETECTED Final   Klebsiella oxytoca NOT DETECTED NOT DETECTED Final   Klebsiella pneumoniae NOT DETECTED NOT DETECTED Final   Proteus species NOT DETECTED NOT DETECTED Final   Salmonella species NOT DETECTED NOT DETECTED Final   Serratia marcescens NOT DETECTED NOT DETECTED Final   Haemophilus influenzae NOT DETECTED NOT DETECTED Final   Neisseria meningitidis NOT DETECTED NOT DETECTED Final   Pseudomonas aeruginosa NOT DETECTED NOT DETECTED Final   Stenotrophomonas maltophilia NOT DETECTED NOT DETECTED Final   Candida albicans NOT DETECTED NOT DETECTED Final   Candida auris NOT DETECTED NOT DETECTED Final   Candida glabrata NOT DETECTED NOT DETECTED Final   Candida krusei NOT DETECTED NOT DETECTED Final   Candida parapsilosis NOT DETECTED NOT DETECTED Final   Candida  tropicalis NOT DETECTED NOT DETECTED Final   Cryptococcus neoformans/gattii NOT DETECTED NOT DETECTED Final    Comment: Performed at [facility], [address] [place], [address] [place]  Resp Panel by RT-PCR (Flu A&B, Covid)     Status: None   Collection Time: 10/29/20  2:22 PM  Specimen: Nasopharyngeal(NP) swabs in vial transport medium  Result Value Ref Range Status   SARS Coronavirus 2 by RT PCR NEGATIVE NEGATIVE Final    Comment: (NOTE) SARS-CoV-2 target nucleic acids are NOT DETECTED.  The SARS-CoV-2 RNA is generally detectable in upper respiratory specimens during the acute phase of infection. The lowest concentration of SARS-CoV-2 viral copies this assay can detect is 138 copies/mL. A negative result does not preclude SARS-Cov-2 infection and should not be used as the sole basis for treatment or other patient management decisions. A negative result may occur with  improper specimen collection/handling, submission of specimen other than nasopharyngeal swab, presence of viral mutation(s) within the areas targeted by this assay, and inadequate number of viral copies(<138 copies/mL). A negative result must be combined with clinical observations, patient history, and epidemiological information. The expected result is Negative.  Fact Sheet for Patients:  BloggerCourse.com  Fact Sheet for Healthcare Providers:  SeriousBroker.it  This test is no t yet approved or cleared by the [place] FDA and  has been authorized for detection and/or diagnosis of SARS-CoV-2 by FDA under an Emergency Use Authorization (EUA). This EUA will remain  in effect (meaning this test can be used) for the duration of the COVID-19 declaration under Section 564(b)(1) of the Act, 21 U.S.C.section 360bbb-3(b)(1), unless the authorization is terminated  or revoked sooner.       Influenza A by PCR NEGATIVE NEGATIVE Final   Influenza B by PCR  NEGATIVE NEGATIVE Final    Comment: (NOTE) The Xpert Xpress SARS-CoV-2/FLU/RSV plus assay is intended as an aid in the diagnosis of influenza from Nasopharyngeal swab specimens and should not be used as a sole basis for treatment. Nasal washings and aspirates are unacceptable for Xpert Xpress SARS-CoV-2/FLU/RSV testing.  Fact Sheet for Patients: BloggerCourse.com  Fact Sheet for Healthcare Providers: SeriousBroker.it  This test is not yet approved or cleared by the [place] FDA and has been authorized for detection and/or diagnosis of SARS-CoV-2 by FDA under an Emergency Use Authorization (EUA). This EUA will remain in effect (meaning this test can be used) for the duration of the COVID-19 declaration under Section 564(b)(1) of the Act, 21 U.S.C. section 360bbb-3(b)(1), unless the authorization is terminated or revoked.  Performed at [facility], [address], [place], [place] [address]   Blood Culture (routine x 2)     Status: None (Preliminary result)   Collection Time: 10/29/20  2:28 PM   Specimen: BLOOD  Result Value Ref Range Status   Specimen Description BLOOD SITE NOT SPECIFIED  Final   Special Requests   Final    BOTTLES DRAWN AEROBIC AND ANAEROBIC Blood Culture adequate volume   Culture   Final    NO GROWTH 2 DAYS Performed at [facility], [address], [place], [place] [address]    Report Status PENDING  Incomplete  Urine Culture     Status: Abnormal   Collection Time: 10/29/20  3:45 PM   Specimen: In/Out Cath Urine  Result Value Ref Range Status   Specimen Description IN/OUT CATH URINE  Final   Special Requests   Final    NONE Performed at [facility], [address], [place], [place] [address]    Culture MULTIPLE SPECIES PRESENT, SUGGEST RECOLLECTION (A)  Final   Report Status 10/30/2020 FINAL  Final          Radiology Studies: CT HEAD WO CONTRAST ( )  Result Date:  10/31/2020 CLINICAL DATA:  Delirium  EXAM: CT HEAD WITHOUT CONTRAST TECHNIQUE: Contiguous axial images were obtained from the base of the skull through the vertex without intravenous contrast. COMPARISON:  06/07/2017 FINDINGS: Brain: Normal anatomic configuration. Parenchymal volume loss is commensurate with the patient's age. Mild periventricular white matter changes are present likely reflecting the sequela of small vessel ischemia. 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 No abnormal intra or extra-axial mass lesion or fluid collection. No abnormal mass effect or midline shift. No evidence of acute intracranial hemorrhage or infarct. Ventricular size is normal. Cerebellum unremarkable. Vascular: No asymmetric hyperdense vasculature at the skull base. Advanced atherosclerotic calcification is noted within the carotid siphons. Skull: Intact Sinuses/Orbits: Paranasal sinuses are clear. Orbits are unremarkable. Other: Mastoid air cells and middle ear cavities are clear. IMPRESSION: Stable senescent change. No acute intracranial hemorrhage or infarct. Electronically Signed   By: Helyn Numbers M.D.   On: 10/31/2020 22:01        Scheduled Meds:  aspirin EC  81 mg Oral QPM   diltiazem  120 mg Oral Daily   docusate sodium  100 mg Oral BID   DULoxetine  90 mg Oral Daily   enoxaparin (LOVENOX) injection  40 mg Subcutaneous Q24H   LORazepam  1 mg Intravenous Once   metoprolol tartrate  25 mg Oral BID   pantoprazole  40 mg Oral Daily   Continuous Infusions:  cefTRIAXone (ROCEPHIN)  IV 2 g (11/01/20 1209)     LOS: 3 days    Time spent: 35 minutes.     Alba Cory, MD [facility]   If 7PM-7AM, please contact night-coverage [email or web address]  11/01/2020, 1:17 PM

## 2020-11-01 NOTE — Social Work (Signed)
CSW attempted to call pt's daughter Clydie Braun to address SNF recommendation however had to leave VM.

## 2020-11-01 NOTE — Progress Notes (Signed)
Occupational Therapy Treatment Patient Details Name: Andrea Yu MRN: [id number] DOB: [birth year] Today's Date: 11/01/2020   History of present illness Pt is an 80 y.o. female admitted 10/29/20 with confusion, decreased oral intake, urinary frequency; of note, pt with recent UTI. Workup for sepsis secondary to UTI. Abdominal CT with questionable R gluteal abscess. PMH includes PAF, neuropathy, anemia, SAH, HTN, obesity.   OT comments  Patient with incremental gains toward patient focused goals.  She remains lethargic, and no oriented to place or situation.  She was able to grasp her cup this afternoon and drink from a straw with mod A, and wash her face with setup and use of her R arm only.  Patient repositioned with Mod A, she was able to bend her knees and push through her legs to boost higher in the bed.  SNF continues to be recommended unless family can provide near Dep care at home.  OT to continue efforts in the acute setting to maximize functional status.     Recommendations for follow up therapy are one component of a multi-disciplinary discharge planning process, led by the attending physician.  Recommendations may be updated based on patient status, additional functional criteria and insurance authorization.    Follow Up Recommendations  SNF;Supervision/Assistance - 24 hour    Equipment Recommendations       Recommendations for Other Services      Precautions / Restrictions Precautions Precaution Comments: Watch HR (tachy) Restrictions Weight Bearing Restrictions: No Other Position/Activity Restrictions: R ankle fused.  States does not walk at home, family assists with transfers and ADL       Mobility Bed Mobility Overal bed mobility: Needs Assistance Bed Mobility: Rolling Rolling: Max assist           Patient Response: Flat affect  Transfers                                                                 ADL either performed or  assessed with clinical judgement   ADL   Eating/Feeding: Maximal assistance;Bed level   Grooming: Wash/dry hands;Wash/dry face;Minimal assistance;Bed level               Lower Body Dressing: Total assistance;Bed level                                         Cognition Arousal/Alertness: Lethargic Behavior During Therapy: Flat affect Overall Cognitive Status: No family/caregiver present to determine baseline cognitive functioning                                 General Comments: patient crying out at times, continues to be lethargic and hard to keep awake.        Exercises     Shoulder Instructions       General Comments      Pertinent Vitals/ Pain       Pain Assessment: Faces Faces Pain Scale: No hurt Pain Intervention(s): Monitored during session  Frequency  Min 2X/week        Progress Toward Goals  OT Goals(current goals can now be found in the care plan section)  Progress towards OT goals: Progressing toward goals  Acute Rehab OT Goals Patient Stated Goal: None stated OT Goal Formulation: Patient unable to participate in goal setting Time For Goal Achievement: 11/14/20 Potential to Achieve Goals: Fair  Plan      Co-evaluation                 AM-PAC OT "6 Clicks" Daily Activity     Outcome Measure   Help from another person eating meals?: A Lot Help from another person taking care of personal grooming?: A Lot Help from another person toileting, which includes using toliet, bedpan, or urinal?: Total Help from another person bathing (including washing, rinsing, drying)?: Total Help from another person to put on and taking off regular upper body clothing?: Total Help from another person to put on and taking off regular lower body clothing?: Total 6 Click Score: 8    End of Session    OT Visit Diagnosis: Other abnormalities of  gait and mobility (R26.89);Unsteadiness on feet (R26.81);Muscle weakness (generalized) (M62.81);Other symptoms and signs involving cognitive function   Activity Tolerance Patient limited by lethargy   Patient Left in bed;with bed alarm set;with call bell/phone within reach   Nurse Communication          Time: 1431-1445 OT Time Calculation (min): 14 min  Charges: OT General Charges $OT Visit: 1 Visit OT Treatments $Self Care/Home Management : 8-22 mins  11/01/2020  RP, OTR/L  Acute Rehabilitation Services  Office:  [phone]   Suzanna Obey 11/01/2020, 2:54 PM

## 2020-11-02 ENCOUNTER — Inpatient Hospital Stay (HOSPITAL_COMMUNITY): Payer: Medicare Other

## 2020-11-02 DIAGNOSIS — R6520 Severe sepsis without septic shock: Secondary | ICD-10-CM | POA: Diagnosis not present

## 2020-11-02 DIAGNOSIS — A419 Sepsis, unspecified organism: Secondary | ICD-10-CM | POA: Diagnosis not present

## 2020-11-02 LAB — BASIC METABOLIC PANEL
Anion gap: 11 (ref 5–15)
BUN: 7 mg/dL — ABNORMAL LOW (ref 8–23)
CO2: 18 mmol/L — ABNORMAL LOW (ref 22–32)
Calcium: 8.1 mg/dL — ABNORMAL LOW (ref 8.9–10.3)
Chloride: 101 mmol/L (ref 98–111)
Creatinine, Ser: 1.09 mg/dL — ABNORMAL HIGH (ref 0.44–1.00)
GFR, Estimated: 51 mL/min — ABNORMAL LOW (ref >60)
Glucose, Bld: 77 mg/dL (ref 70–99)
Potassium: 3.5 mmol/L (ref 3.5–5.1)
Sodium: 130 mmol/L — ABNORMAL LOW (ref 135–145)

## 2020-11-02 LAB — CBC
HCT: 32 % — ABNORMAL LOW (ref 36.0–46.0)
Hemoglobin: 10.1 g/dL — ABNORMAL LOW (ref 12.0–15.0)
MCH: 27.9 pg (ref 26.0–34.0)
MCHC: 31.6 g/dL (ref 30.0–36.0)
MCV: 88.4 fL (ref 80.0–100.0)
Platelets: 231 10*3/uL (ref 150–400)
RBC: 3.62 MIL/uL — ABNORMAL LOW (ref 3.87–5.11)
RDW: 15 % (ref 11.5–15.5)
WBC: 13.2 10*3/uL — ABNORMAL HIGH (ref 4.0–10.5)
nRBC: 0 % (ref 0.0–0.2)

## 2020-11-02 NOTE — Evaluation (Signed)
Clinical/Bedside Swallow Evaluation Patient Details  Name: Andrea Yu MRN: [id number] Date of Birth: [birth year]  Today's Date: 11/02/2020 Time: SLP Start Time (ACUTE ONLY): 1752 SLP Stop Time (ACUTE ONLY): 1809 SLP Time Calculation (min) (ACUTE ONLY): 17 min  Past Medical History:  Past Medical History:  Diagnosis Date   A-fib (HCC)    "just after knee 2nd OR" (11/07/2012)   Anemia    Arthritis    RA , SACROTIC ARTHRITIS   Complication of anesthesia    found to be in afib when she woke up   Dysrhythmia    GERD (gastroesophageal reflux disease)    Head injury, closed, with concussion    History of blood transfusion    "after 1 of my knee ORs and today" (11/07/2012)   Hypertension    Peripheral neuropathy    "from back OR in 2005; in hands and feet" (11/07/2012)   Psoriatic arthritis (HCC)    on Prednisone Rx   Past Surgical History:  Past Surgical History:  Procedure Laterality Date   ANKLE FUSION Right 01/07/2016   Procedure: Right Tibiocalcaneal Fusion;  Surgeon: Nadara Mustard, MD;  Location: [facility] OR;  Service: Orthopedics;  Laterality: Right;   BACK SURGERY     CATARACT EXTRACTION W/ INTRAOCULAR LENS  IMPLANT, BILATERAL Bilateral 2007   CHOLECYSTECTOMY  2000's   COLONOSCOPY N/A 11/10/2012   Procedure: COLONOSCOPY;  Surgeon: Theda Belfast, MD;  Location: [facility] ENDOSCOPY;  Service: Endoscopy;  Laterality: N/A;   ESOPHAGOGASTRODUODENOSCOPY N/A 11/10/2012   Procedure: ESOPHAGOGASTRODUODENOSCOPY (EGD);  Surgeon: Theda Belfast, MD;  Location: [facility] ENDOSCOPY;  Service: Endoscopy;  Laterality: N/A;   EYE SURGERY     JOINT REPLACEMENT     POSTERIOR LUMBAR FUSION  2005   "got a rod and 4 pins in there" (11/07/2012)   REPLACEMENT TOTAL KNEE BILATERAL Bilateral 2007   TRANSTHORACIC ECHOCARDIOGRAM  05/24/2006   TUBAL LIGATION  1974   VAGINAL HYSTERECTOMY  1983   HPI:  Pt is an 80 y.o. female admitted 10/29/20 with confusion, decreased oral intake, urinary frequency. Ct head  negative. Pt declined MRI. Dx sepsis secondary to UTI, acute metabolic encephalopathy. Abdominal CT with questionable R gluteal abscess. CXR 10/5 negative for acute changes. PMH: PAF, neuropathy, anemia, SAH, HTN, obesity, GERD.    Assessment / Plan / Recommendation  Clinical Impression  Pt was seen for bedside swallow evaluation. Pt's RN reported that the pt has been lethargic today and demonstrated coughing once when medications were administered. Pt denied a history of oropharyngeal dysphagia, but stated that she typically prefers soft solids. Oral mechanism exam was limited due to pt's difficulty consistently following commands; however, oral motor strength and ROM appeared grossly WFL. Dentition was reduced with six mandibular teeth and maxillary dentures. She demonstrated prolonged mastication of regular textures and mild to moderate lingual residue with regular textures which was cleared with liquid washes. Pt removed partially-masticated meats (from dinner tray) from her mouth and stated that they were too hard. A dysphagia 3 diet with thin liquids is recommended at this time. SLP will follow briefly to ensure tolerance and to assess need for further intervention. SLP Visit Diagnosis: Dysphagia, unspecified (R13.10)    Aspiration Risk  Mild aspiration risk    Diet Recommendation Thin liquid;Dysphagia 3 (Mech soft)   Liquid Administration via: Cup;Straw Medication Administration: Whole meds with puree Supervision: Staff to assist with self feeding Compensations: Slow rate;Small sips/bites Postural Changes: Seated upright at 90 degrees    Other  Recommendations  Oral Care Recommendations: Oral care BID;Staff/trained caregiver to provide oral care    Recommendations for follow up therapy are one component of a multi-disciplinary discharge planning process, led by the attending physician.  Recommendations may be updated based on patient status, additional functional criteria and insurance  authorization.  Follow up Recommendations  (TBD)      Frequency and Duration min 2x/week  1 week       Prognosis Barriers to Reach Goals: Cognitive deficits      Swallow Study   General Date of Onset: 11/01/20 HPI: Pt is an 80 y.o. female admitted 10/29/20 with confusion, decreased oral intake, urinary frequency. Ct head negative. Pt declined MRI. Dx sepsis secondary to UTI, acute metabolic encephalopathy. Abdominal CT with questionable R gluteal abscess. CXR 10/5 negative for acute changes. PMH: PAF, neuropathy, anemia, SAH, HTN, obesity, GERD. Type of Study: Bedside Swallow Evaluation Previous Swallow Assessment: none Diet Prior to this Study: Regular;Thin liquids Temperature Spikes Noted: No Respiratory Status: Room air History of Recent Intubation: No Behavior/Cognition: Alert;Cooperative;Confused Oral Cavity Assessment: Within Functional Limits Oral Care Completed by SLP: No Vision: Functional for self-feeding Self-Feeding Abilities: Needs assist Patient Positioning: Upright in bed;Postural control adequate for testing Baseline Vocal Quality: Normal Volitional Swallow: Able to elicit    Oral/Motor/Sensory Function Overall Oral Motor/Sensory Function: Within functional limits (limited)   Ice Chips Ice chips: Within functional limits Presentation: Spoon   Thin Liquid Thin Liquid: Within functional limits Presentation: Straw    Nectar Thick Nectar Thick Liquid: Not tested   Honey Thick Honey Thick Liquid: Not tested   Puree Puree: Within functional limits Presentation: Spoon   Solid     Solid: Impaired Presentation: Spoon Oral Phase Impairments: Impaired mastication Oral Phase Functional Implications: Impaired mastication;Oral residue     Lavanna Rog I. Vear Clock, MS, CCC-SLP Acute Rehabilitation Services Office number [phone] Pager [phone]  Scheryl Marten 11/02/2020,6:14 PM

## 2020-11-02 NOTE — TOC Progression Note (Signed)
Transition of Care [facility]) - Progression Note    Patient Details  Name: [name] MRN: [id number] Date of Birth: [birth year]  Transition of Care [facility]) CM/SW Contact  Patrice [place], [place] Phone Number: [phone] 11/02/2020, 2:42 PM  Clinical Narrative:     CSW attempted to call pt's daughter Clydie Braun on her cell and home number and was unable to reach her.  CSW went by pt's room and there was no one in the room  CSW spoke with pt's RN Onalee Hua to ascertain if daughter had been to see pt. He explained that he spoke with her this morning and she plans to come visit today however did not say when. CSW ask if Onalee Hua she daughter in the next couple of hours, to reach out to CSW.  TOC team will continue to assist with discharge planning needs.        Expected Discharge Plan and Services                                                 Social Determinants of Health (SDOH) Interventions    Readmission Risk Interventions No flowsheet data found.

## 2020-11-02 NOTE — Progress Notes (Signed)
[facility]    Date of Admission:  10/29/2020   Total days of antibiotics 5/day 4 ceftriaxone          ID: Andrea Yu is a 80 y.o. female with intermittent fevers Principal Problem:   Severe sepsis (HCC) Active Problems:   Adrenal insufficiency (HCC)   PAF (paroxysmal atrial fibrillation) (HCC)   Psoriatic arthritis (HCC)   Iron deficiency anemia- transfused this admission   Depression   Peripheral neuropathy   Obesity (BMI 30-39.9)- suspected sleep apnea, declines sleep study   SAH (subarachnoid hemorrhage) (HCC)   Essential hypertension   GERD (gastroesophageal reflux disease)    Subjective: Had fever up to 102F last night, though afebrile this morning. Declined MRI of pelvis/spine  Medications:   aspirin EC  81 mg Oral QPM   diltiazem  120 mg Oral Daily   docusate sodium  100 mg Oral BID   DULoxetine  90 mg Oral Daily   enoxaparin (LOVENOX) injection  40 mg Subcutaneous Q24H   LORazepam  1 mg Intravenous Once   metoprolol tartrate  25 mg Oral BID   pantoprazole  40 mg Oral Daily    Objective: Vital signs in last 24 hours: Temp:  [98 F (36.7 C)-103 F (39.4 C)] 98.2 F (36.8 C) (10/09 1211) Pulse Rate:  [80-95] 80 (10/09 1211) Resp:  [18-20] 20 (10/09 1211) BP: (105-135)/(51-65) 105/64 (10/09 1211) SpO2:  [96 %-100 %] 96 % (10/09 1211)  Physical Exam  Constitutional:  oriented to person, self only. appears well-developed and well-nourished. No distress.  HENT: [place]/AT, PERRLA, no scleral icterus Mouth/Throat: Oropharynx is clear and moist. No oropharyngeal exudate.  Cardiovascular: Normal rate, regular rhythm and normal heart sounds. Exam reveals no gallop and no friction rub.  No murmur heard.  Pulmonary/Chest: Effort normal and breath sounds normal. No respiratory distress.  has no wheezes.  Neck = supple, no nuchal rigidity Abdominal: Soft. Bowel sounds are normal.  exhibits no distension. There is no tenderness.   Lymphadenopathy: no cervical adenopathy. No axillary adenopathy Neurological: alert and oriented to person, place, and time.  Skin: Skin is warm and dry. No rash noted. No erythema.  Psychiatric: a normal mood and affect.  behavior is normal.    Lab Results Recent Labs    11/01/20 0753 11/02/20 0746  WBC 14.7* 13.2*  HGB 9.7* 10.1*  HCT 30.0* 32.0*  NA 131* 130*  K 3.5 3.5  CL 101 101  CO2 18* 18*  BUN 9 7*  CREATININE 0.96 1.09*     Microbiology: Blood cx 1 set with strep bacteremia Studies/Results: CT HEAD WO CONTRAST ( )  Result Date: 10/31/2020 CLINICAL DATA:  Delirium EXAM: CT HEAD WITHOUT CONTRAST TECHNIQUE: Contiguous axial images were obtained from the base of the skull through the vertex without intravenous contrast. COMPARISON:  06/07/2017 FINDINGS: Brain: Normal anatomic configuration. Parenchymal volume loss is commensurate with the patient's age. Mild periventricular white matter changes are present likely reflecting the sequela of small vessel ischemia. 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 No abnormal intra or extra-axial mass lesion or fluid collection. No abnormal mass effect or midline shift. No evidence of acute intracranial hemorrhage or infarct. Ventricular size is normal. Cerebellum unremarkable. Vascular: No asymmetric hyperdense vasculature at the skull base. Advanced atherosclerotic calcification is noted within the carotid siphons. Skull: Intact Sinuses/Orbits: Paranasal sinuses are clear. Orbits are unremarkable. Other: Mastoid air cells and middle ear cavities are clear. IMPRESSION: Stable  senescent change. No acute intracranial hemorrhage or infarct. Electronically Signed   By: Helyn Numbers M.D.   On: 10/31/2020 22:01   ECHOCARDIOGRAM COMPLETE  Result Date: 11/01/2020    ECHOCARDIOGRAM REPORT   Patient Name:   Andrea Yu Date of Exam: 11/01/2020 Medical Rec #:  [id number]     Height:       59.0 in Accession #:     [id number]    Weight:       183.0 lb Date of Birth:  [birth year]    BSA:          1.776 m Patient Age:    79 years      BP:           127/62 mmHg Patient Gender: F             HR:           81 bpm. Exam Location:  Inpatient Procedure: 2D Echo, Cardiac Doppler and Color Doppler Indications:    Bacteremia  History:        Patient has prior history of Echocardiogram examinations, most                 recent 04/19/2017. Arrythmias:Atrial Fibrillation; Risk                 Factors:Hypertension.  Sonographer:    Eulah Pont RDCS Referring Phys: Encarnacion Chu COMER IMPRESSIONS  1. Left ventricular ejection fraction, by estimation, is 60 to 65%. The left ventricle has normal function. The left ventricle has no regional wall motion abnormalities. Left ventricular diastolic function could not be evaluated.  2. Right ventricular systolic function is normal. The right ventricular size is normal. There is mildly elevated pulmonary artery systolic pressure.  3. Left atrial size was mildly dilated.  4. The mitral valve is normal in structure. Trivial mitral valve regurgitation.  5. The aortic valve is normal in structure. Aortic valve regurgitation is mild. Mild aortic valve sclerosis is present, with no evidence of aortic valve stenosis.  6. The inferior vena cava is dilated in size with >50% respiratory variability, suggesting right atrial pressure of 8 mmHg. Conclusion(s)/Recommendation(s): No evidence of valvular vegetations on this transthoracic echocardiogram. Would recommend a transesophageal echocardiogram to exclude infective endocarditis if clinically indicated. FINDINGS  Left Ventricle: Left ventricular ejection fraction, by estimation, is 60 to 65%. The left ventricle has normal function. The left ventricle has no regional wall motion abnormalities. The left ventricular internal cavity size was normal in size. There is  no left ventricular hypertrophy. Left ventricular diastolic function could not be evaluated due  to atrial fibrillation. Left ventricular diastolic function could not be evaluated. Right Ventricle: The right ventricular size is normal. No increase in right ventricular wall thickness. Right ventricular systolic function is normal. There is mildly elevated pulmonary artery systolic pressure. The tricuspid regurgitant velocity is 2.73  m/s, and with an assumed right atrial pressure of 8 mmHg, the estimated right ventricular systolic pressure is 37.8 mmHg. Left Atrium: Left atrial size was mildly dilated. Right Atrium: Right atrial size was normal in size. Pericardium: There is no evidence of pericardial effusion. Mitral Valve: The mitral valve is normal in structure. Mild mitral annular calcification. Trivial mitral valve regurgitation. Tricuspid Valve: The tricuspid valve is normal in structure. Tricuspid valve regurgitation is mild. Aortic Valve: The aortic valve is normal in structure. Aortic valve regurgitation is mild. Aortic regurgitation PHT measures 748 msec. Mild aortic valve sclerosis is present, with no  evidence of aortic valve stenosis. Pulmonic Valve: The pulmonic valve was normal in structure. Pulmonic valve regurgitation is not visualized. Aorta: The aortic root and ascending aorta are structurally normal, with no evidence of dilitation. Venous: The inferior vena cava is dilated in size with greater than 50% respiratory variability, suggesting right atrial pressure of 8 mmHg. IAS/Shunts: No atrial level shunt detected by color flow Doppler.  LEFT VENTRICLE PLAX 2D LVIDd:         3.70 cm LVIDs:         2.40 cm LV PW:         1.10 cm LV IVS:        1.00 cm LVOT diam:     1.80 cm LV SV:         48 LV SV Index:   27 LVOT Area:     2.54 cm  RIGHT VENTRICLE TAPSE (M-mode): 1.9 cm LEFT ATRIUM           Index        RIGHT ATRIUM           Index LA diam:      4.20 cm 2.36 cm/m   RA Area:     13.00 cm LA Vol (A2C): 51.6 ml 29.05 ml/m  RA Volume:   31.20 ml  17.57 ml/m LA Vol (A4C): 88.0 ml 49.56 ml/m   AORTIC VALVE LVOT Vmax:   113.00 cm/s LVOT Vmean:  73.300 cm/s LVOT VTI:    0.190 m AI PHT:      748 msec  AORTA Ao Asc diam: 3.10 cm TRICUSPID VALVE TR Peak grad:   29.8 mmHg TR Vmax:        273.00 cm/s  SHUNTS Systemic VTI:  0.19 m Systemic Diam: 1.80 cm Rachelle Hora Croitoru MD Electronically signed by Thurmon Fair MD Signature Date/Time: 11/01/2020/1:18:45 PM    Final      Assessment/Plan: Intermittent fevers = unclear if it related to fluid collection noted on abd CT imaging. Fairly large. Recommend to see if can offer aspiration to see if it is infected and the cause of her fever. Will check sed rate and crp  Recommend repeat cxr to see if aspiration  Strep bacteremia = continue on ceftriaxone 2gm IV dialy  [facility] for Infectious Diseases Pager: [phone]  11/02/2020, 3:57 PM

## 2020-11-02 NOTE — Progress Notes (Signed)
PROGRESS NOTE    Andrea Yu  NWG:[id number] DOB: 1940/09/06 DOA: 10/29/2020 PCP: Jarome Matin, MD   Brief Narrative: 80 year old with past medical history significant for paroxysmal A. fib, GERD, neuropathy, morbid obesity, psoriatic arthritis, iron deficiency anemia, history of subarachnoid hemorrhage, hypertension who presents to the ED complaining of the urinary frequency and decreased oral intake.  Patient report having UTI and was prescribed Bactrim by her PCP.  She completed course and despite still continues to have dysuria.  Patient was noted to be slightly confused. Evaluation in the ED patient was tachycardic, hypotensive, tachypneic, febrile.  Leukocytosis white blood cell 15.3.  UA moderate leukocyte.  CT abdomen show fluid collection inferior portion of the right buttock region inferior to the right ischial tuberosity.    Assessment & Plan:   Principal Problem:   Severe sepsis (HCC) Active Problems:   Adrenal insufficiency (HCC)   PAF (paroxysmal atrial fibrillation) (HCC)   Psoriatic arthritis (HCC)   Iron deficiency anemia- transfused this admission   Depression   Peripheral neuropathy   Obesity (BMI 30-39.9)- suspected sleep apnea, declines sleep study   SAH (subarachnoid hemorrhage) (HCC)   Essential hypertension   GERD (gastroesophageal reflux disease)   1-Sepsis secondary to UTI Severe sepsis ruled out .  Presented with fever tachycardia tachypnea and hypotension. Received IV fluids. Continue with  IV ceftriaxone.  Continue to spike fever, ID will follow up on patient today.  1-4 Blood culture positive for Streptococcus species. .  WBC down to 13. Repeated Blood culture 10/07: pending.   2-question of right gluteal abscess: Present  with fever, hypotension concern for sepsis.  General surgery and ID has been consulted.  She had a prior history of abscess.  Per ID and surgery no need for drainage. Less likely abscess.   3-Acute Metabolic  Encephalopathy;  Patient not very conversant, not following command.  She was notice to be febrile.  Plan for MRI but patient decline MRI.  CT head no acute intracranial pathology.  Plan to repeat CT head.   A. fib with RVR: Cardiology consulted and following Started on oral Cardizem. Transiently overnight on Cardizem Gtt.  ECHO; normal EF>   Hypertension: Continue with Cardizem  Generalized weakness: PT OT  Depression Continue with Cymbalta. HypoKalemia: Replaced.  Hyponatremia; received fluids.  Peripheral neuropathy: Continue with gabapentin   Pressure Injury 04/29/17 Stage II -  Partial thickness loss of dermis presenting as a shallow open ulcer with a red, pink wound bed without slough. healing callus, open pink wound bed (Active)  04/29/17 0800  Location: Heel  Location Orientation: Right  Staging: Stage II -  Partial thickness loss of dermis presenting as a shallow open ulcer with a red, pink wound bed without slough.  Wound Description (Comments): healing callus, open pink wound bed  Present on Admission:      Pressure Injury 10/30/20 Buttocks Right healed (Active)  10/30/20 2000  Location: Buttocks  Location Orientation: Right  Staging:   Wound Description (Comments): healed  Present on Admission: Yes        Estimated body mass index is 36.96 kg/m as calculated from the following:   Height as of this encounter: 4\' 11"  (1.499 m).   Weight as of this encounter: 83 kg.   DVT prophylaxis: Lovenox Code Status: Full code Family Communication: Daughter over phone 10/08 Disposition Plan:  Status is: Inpatient  Remains inpatient appropriate because:IV treatments appropriate due to intensity of illness or inability to take PO  Dispo: The patient  is from: Home              Anticipated d/c is to: Home              Patient currently is not medically stable to d/c.   Difficult to place patient No        Consultants:  ID General surgery  Procedures:     Antimicrobials:  Aztreonam  Subjective: She is alert, say few words. Refuse MRI again  Objective: Vitals:   11/02/20 0801 11/02/20 0827 11/02/20 1054 11/02/20 1211  BP: (!) 123/51   105/64  Pulse: 95 90  80  Resp: Temp: (!) 103 F (39.4 C) (!) 102.7 F (39.3 C) 98.1 F (36.7 C) 98.2 F (36.8 C)  TempSrc: Oral   Oral  SpO2: 100% 99%  96%  Weight:      Height:        Intake/Output Summary (Last 24 hours) at 11/02/2020 1436 Last data filed at 11/02/2020 0645 Gross per 24 hour  Intake --  Output 900 ml  Net -900 ml    Filed Weights   10/30/20 0700  Weight: 83 kg    Examination:  General exam: NAD  Respiratory system: CTA Cardiovascular system: S 1, S 2 RRR Gastrointestinal system: BS present, soft, nt Central nervous system: alert, say few words Extremities: deformed, no edema    Data Reviewed: I have personally reviewed following labs and imaging studies  CBC: Recent Labs  Lab 10/29/20 1428 10/29/20 1629 10/29/20 2050 10/31/20 0159 11/01/20 0753 11/02/20 0746  WBC 15.3*  --  10.5 16.0* 14.7* 13.2*  NEUTROABS 12.1*  --   --   --   --   --   HGB 12.0 10.5* 9.9* 9.4* 9.7* 10.1*  HCT 38.1 31.0* 31.9* 29.6* 30.0* 32.0*  MCV 89.6  --  89.4 88.6 86.5 88.4  PLT 197  --  178 184 217 231    Basic Metabolic Panel: Recent Labs  Lab 10/29/20 1428 10/29/20 1629 10/29/20 1726 10/30/20 0558 10/31/20 0159 11/01/20 0753 11/02/20 0746  NA 133* 133*  --  133* 133* 131* 130*  K 5.1 4.9  --  3.2* 3.4* 3.5 3.5  CL 103 106  --  100 103 101 101  CO2 18*  --   --  20* 18* 18* 18*  GLUCOSE 105* 97  --  82 83 78 77  BUN 24* 23  --  7*  CREATININE 1.22* 1.00 0.98 1.06* 0.91 0.96 1.09*  CALCIUM 9.0  --   --  8.3* 8.1* 8.0* 8.1*    GFR: Estimated Creatinine Clearance: 37.8 mL/min (A) (by C-G formula based on SCr of 1.09 mg/dL (H)). Liver Function Tests: Recent Labs  Lab 10/29/20 1428 10/30/20 0558  AST 22 21  ALT 19 18  ALKPHOS 76 61   BILITOT 0.9 1.0  PROT 6.7 5.2*  ALBUMIN 2.9* 2.4*    No results for input(s): LIPASE, AMYLASE in the last 168 hours. No results for input(s): AMMONIA in the last 168 hours. Coagulation Profile: Recent Labs  Lab 10/29/20 1428 10/30/20 0558  INR 1.2 1.2    Cardiac Enzymes: No results for input(s): CKTOTAL, CKMB, CKMBINDEX, TROPONINI in the last 168 hours. BNP (last 3 results) No results for input(s): PROBNP in the last 8760 hours. HbA1C: No results for input(s): HGBA1C in the last 72 hours. CBG: No results for input(s): GLUCAP in the last 168 hours. Lipid Profile: No results  for input(s): CHOL, HDL, LDLCALC, TRIG, CHOLHDL, LDLDIRECT in the last 72 hours. Thyroid Function Tests: No results for input(s): TSH, T4TOTAL, FREET4, T3FREE, THYROIDAB in the last 72 hours. Anemia Panel: No results for input(s): VITAMINB12, FOLATE, FERRITIN, TIBC, IRON, RETICCTPCT in the last 72 hours. Sepsis Labs: Recent Labs  Lab 10/29/20 1428 10/29/20 1710 10/30/20 0558  PROCALCITON  --   --  1.20  LATICACIDVEN 1.0 1.8  --      Recent Results (from the past 240 hour(s))  Blood Culture (routine x 2)     Status: Abnormal   Collection Time: 10/29/20 10:45 AM   Specimen: BLOOD  Result Value Ref Range Status   Specimen Description BLOOD BLOOD RIGHT FOREARM  Final   Special Requests   Final    BOTTLES DRAWN AEROBIC AND ANAEROBIC Blood Culture adequate volume   Culture  Setup Time   Final    GRAM POSITIVE COCCI AEROBIC BOTTLE ONLY CRITICAL RESULT CALLED TO, READ BACK BY AND VERIFIED WITH: HURTH PHARMD @0920  10/31/20 EB    Culture (A)  Final    STREPTOCOCCUS PARASANGUINIS THE SIGNIFICANCE OF ISOLATING THIS ORGANISM FROM A SINGLE SET OF BLOOD CULTURES WHEN MULTIPLE SETS ARE DRAWN IS UNCERTAIN. PLEASE NOTIFY THE MICROBIOLOGY DEPARTMENT WITHIN ONE WEEK IF SPECIATION AND SENSITIVITIES ARE REQUIRED. Performed at [facility], [address], [place], [place]    Report Status  11/01/2020 FINAL  Final  Blood Culture ID Panel (Reflexed)     Status: Abnormal   Collection Time: 10/29/20 10:45 AM  Result Value Ref Range Status   Enterococcus faecalis NOT DETECTED NOT DETECTED Final   Enterococcus Faecium NOT DETECTED NOT DETECTED Final   Listeria monocytogenes NOT DETECTED NOT DETECTED Final   Staphylococcus species NOT DETECTED NOT DETECTED Final   Staphylococcus aureus (BCID) NOT DETECTED NOT DETECTED Final   Staphylococcus epidermidis NOT DETECTED NOT DETECTED Final   Staphylococcus lugdunensis NOT DETECTED NOT DETECTED Final   Streptococcus species DETECTED (A) NOT DETECTED Final    Comment: Not Enterococcus species, Streptococcus agalactiae, Streptococcus pyogenes, or Streptococcus pneumoniae. CRITICAL RESULT CALLED TO, READ BACK BY AND VERIFIED WITH: HURTH PHARMD @0920  10/31/20 EB    Streptococcus agalactiae NOT DETECTED NOT DETECTED Final   Streptococcus pneumoniae NOT DETECTED NOT DETECTED Final   Streptococcus pyogenes NOT DETECTED NOT DETECTED Final   A.calcoaceticus-baumannii NOT DETECTED NOT DETECTED Final   Bacteroides fragilis NOT DETECTED NOT DETECTED Final   Enterobacterales NOT DETECTED NOT DETECTED Final   Enterobacter cloacae complex NOT DETECTED NOT DETECTED Final   Escherichia coli NOT DETECTED NOT DETECTED Final   Klebsiella aerogenes NOT DETECTED NOT DETECTED Final   Klebsiella oxytoca NOT DETECTED NOT DETECTED Final   Klebsiella pneumoniae NOT DETECTED NOT DETECTED Final   Proteus species NOT DETECTED NOT DETECTED Final   Salmonella species NOT DETECTED NOT DETECTED Final   Serratia marcescens NOT DETECTED NOT DETECTED Final   Haemophilus influenzae NOT DETECTED NOT DETECTED Final   Neisseria meningitidis NOT DETECTED NOT DETECTED Final   Pseudomonas aeruginosa NOT DETECTED NOT DETECTED Final   Stenotrophomonas maltophilia NOT DETECTED NOT DETECTED Final   Candida albicans NOT DETECTED NOT DETECTED Final   Candida auris NOT DETECTED NOT  DETECTED Final   Candida glabrata NOT DETECTED NOT DETECTED Final   Candida krusei NOT DETECTED NOT DETECTED Final   Candida parapsilosis NOT DETECTED NOT DETECTED Final   Candida tropicalis NOT DETECTED NOT DETECTED Final   Cryptococcus neoformans/gattii NOT DETECTED NOT DETECTED Final  Comment: Performed at [facility], [address], [place], [place] [address]  Resp Panel by RT-PCR (Flu A&B, Covid)     Status: None   Collection Time: 10/29/20  2:22 PM   Specimen: Nasopharyngeal(NP) swabs in vial transport medium  Result Value Ref Range Status   SARS Coronavirus 2 by RT PCR NEGATIVE NEGATIVE Final    Comment: (NOTE) SARS-CoV-2 target nucleic acids are NOT DETECTED.  The SARS-CoV-2 RNA is generally detectable in upper respiratory specimens during the acute phase of infection. The lowest concentration of SARS-CoV-2 viral copies this assay can detect is 138 copies/mL. A negative result does not preclude SARS-Cov-2 infection and should not be used as the sole basis for treatment or other patient management decisions. A negative result may occur with  improper specimen collection/handling, submission of specimen other than nasopharyngeal swab, presence of viral mutation(s) within the areas targeted by this assay, and inadequate number of viral copies(<138 copies/mL). A negative result must be combined with clinical observations, patient history, and epidemiological information. The expected result is Negative.  Fact Sheet for Patients:  BloggerCourse.com  Fact Sheet for Healthcare Providers:  SeriousBroker.it  This test is no t yet approved or cleared by the [place] FDA and  has been authorized for detection and/or diagnosis of SARS-CoV-2 by FDA under an Emergency Use Authorization (EUA). This EUA will remain  in effect (meaning this test can be used) for the duration of the COVID-19 declaration under Section  564(b)(1) of the Act, 21 U.S.C.section 360bbb-3(b)(1), unless the authorization is terminated  or revoked sooner.       Influenza A by PCR NEGATIVE NEGATIVE Final   Influenza B by PCR NEGATIVE NEGATIVE Final    Comment: (NOTE) The Xpert Xpress SARS-CoV-2/FLU/RSV plus assay is intended as an aid in the diagnosis of influenza from Nasopharyngeal swab specimens and should not be used as a sole basis for treatment. Nasal washings and aspirates are unacceptable for Xpert Xpress SARS-CoV-2/FLU/RSV testing.  Fact Sheet for Patients: BloggerCourse.com  Fact Sheet for Healthcare Providers: SeriousBroker.it  This test is not yet approved or cleared by the [place] FDA and has been authorized for detection and/or diagnosis of SARS-CoV-2 by FDA under an Emergency Use Authorization (EUA). This EUA will remain in effect (meaning this test can be used) for the duration of the COVID-19 declaration under Section 564(b)(1) of the Act, 21 U.S.C. section 360bbb-3(b)(1), unless the authorization is terminated or revoked.  Performed at [facility], [address], [place], [place] [address]   Blood Culture (routine x 2)     Status: None (Preliminary result)   Collection Time: 10/29/20  2:28 PM   Specimen: BLOOD  Result Value Ref Range Status   Specimen Description BLOOD SITE NOT SPECIFIED  Final   Special Requests   Final    BOTTLES DRAWN AEROBIC AND ANAEROBIC Blood Culture adequate volume   Culture   Final    NO GROWTH 4 DAYS Performed at [facility], [address], [place], [place] [address]    Report Status PENDING  Incomplete  Urine Culture     Status: Abnormal   Collection Time: 10/29/20  3:45 PM   Specimen: In/Out Cath Urine  Result Value Ref Range Status   Specimen Description IN/OUT CATH URINE  Final   Special Requests   Final    NONE Performed at [facility], [address] [place], [place] [address]     Culture MULTIPLE SPECIES  PRESENT, SUGGEST RECOLLECTION (A)  Final   Report Status 10/30/2020 FINAL  Final  Culture, blood (routine x 2)     Status: None (Preliminary result)   Collection Time: 10/31/20  2:30 PM   Specimen: BLOOD  Result Value Ref Range Status   Specimen Description BLOOD RIGHT ANTECUBITAL  Final   Special Requests   Final    BOTTLES DRAWN AEROBIC ONLY Blood Culture results may not be optimal due to an inadequate volume of blood received in culture bottles   Culture   Final    NO GROWTH 2 DAYS Performed at [facility], [address] [place], [place] [address]    Report Status PENDING  Incomplete  Culture, blood (routine x 2)     Status: None (Preliminary result)   Collection Time: 10/31/20  2:35 PM   Specimen: BLOOD RIGHT HAND  Result Value Ref Range Status   Specimen Description BLOOD RIGHT HAND  Final   Special Requests   Final    BOTTLES DRAWN AEROBIC ONLY Blood Culture results may not be optimal due to an inadequate volume of blood received in culture bottles   Culture   Final    NO GROWTH 2 DAYS Performed at [facility], [address] [place], [place] [address]    Report Status PENDING  Incomplete          Radiology Studies: CT HEAD WO CONTRAST ( )  Result Date: 10/31/2020 CLINICAL DATA:  Delirium EXAM: CT HEAD WITHOUT CONTRAST TECHNIQUE: Contiguous axial images were obtained from the base of the skull through the vertex without intravenous contrast. COMPARISON:  06/07/2017 FINDINGS: Brain: Normal anatomic configuration. Parenchymal volume loss is commensurate with the patient's age. Mild periventricular white matter changes are present likely reflecting the sequela of small vessel ischemia. 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 No abnormal intra or extra-axial mass lesion or fluid collection. No abnormal mass effect or midline shift. No evidence of acute intracranial hemorrhage or infarct.  Ventricular size is normal. Cerebellum unremarkable. Vascular: No asymmetric hyperdense vasculature at the skull base. Advanced atherosclerotic calcification is noted within the carotid siphons. Skull: Intact Sinuses/Orbits: Paranasal sinuses are clear. Orbits are unremarkable. Other: Mastoid air cells and middle ear cavities are clear. IMPRESSION: Stable senescent change. No acute intracranial hemorrhage or infarct. Electronically Signed   By: Helyn Numbers M.D.   On: 10/31/2020 22:01   ECHOCARDIOGRAM COMPLETE  Result Date: 11/01/2020    ECHOCARDIOGRAM REPORT   Patient Name:   Andrea Yu Date of Exam: 11/01/2020 Medical Rec #:  [id number]     Height:       59.0 in Accession #:    [id number]    Weight:       183.0 lb Date of Birth:  [birth year]    BSA:          1.776 m Patient Age:    79 years      BP:           127/62 mmHg Patient Gender: F             HR:           81 bpm. Exam Location:  Inpatient Procedure: 2D Echo, Cardiac Doppler and Color Doppler Indications:    Bacteremia  History:        Patient has prior history of Echocardiogram examinations, most                 recent  04/19/2017. Arrythmias:Atrial Fibrillation; Risk                 Factors:Hypertension.  Sonographer:    Eulah Pont RDCS Referring Phys: Encarnacion Chu COMER IMPRESSIONS  1. Left ventricular ejection fraction, by estimation, is 60 to 65%. The left ventricle has normal function. The left ventricle has no regional wall motion abnormalities. Left ventricular diastolic function could not be evaluated.  2. Right ventricular systolic function is normal. The right ventricular size is normal. There is mildly elevated pulmonary artery systolic pressure.  3. Left atrial size was mildly dilated.  4. The mitral valve is normal in structure. Trivial mitral valve regurgitation.  5. The aortic valve is normal in structure. Aortic valve regurgitation is mild. Mild aortic valve sclerosis is present, with no evidence of aortic valve stenosis.  6. The  inferior vena cava is dilated in size with >50% respiratory variability, suggesting right atrial pressure of 8 mmHg. Conclusion(s)/Recommendation(s): No evidence of valvular vegetations on this transthoracic echocardiogram. Would recommend a transesophageal echocardiogram to exclude infective endocarditis if clinically indicated. FINDINGS  Left Ventricle: Left ventricular ejection fraction, by estimation, is 60 to 65%. The left ventricle has normal function. The left ventricle has no regional wall motion abnormalities. The left ventricular internal cavity size was normal in size. There is  no left ventricular hypertrophy. Left ventricular diastolic function could not be evaluated due to atrial fibrillation. Left ventricular diastolic function could not be evaluated. Right Ventricle: The right ventricular size is normal. No increase in right ventricular wall thickness. Right ventricular systolic function is normal. There is mildly elevated pulmonary artery systolic pressure. The tricuspid regurgitant velocity is 2.73  m/s, and with an assumed right atrial pressure of 8 mmHg, the estimated right ventricular systolic pressure is 37.8 mmHg. Left Atrium: Left atrial size was mildly dilated. Right Atrium: Right atrial size was normal in size. Pericardium: There is no evidence of pericardial effusion. Mitral Valve: The mitral valve is normal in structure. Mild mitral annular calcification. Trivial mitral valve regurgitation. Tricuspid Valve: The tricuspid valve is normal in structure. Tricuspid valve regurgitation is mild. Aortic Valve: The aortic valve is normal in structure. Aortic valve regurgitation is mild. Aortic regurgitation PHT measures 748 msec. Mild aortic valve sclerosis is present, with no evidence of aortic valve stenosis. Pulmonic Valve: The pulmonic valve was normal in structure. Pulmonic valve regurgitation is not visualized. Aorta: The aortic root and ascending aorta are structurally normal, with no  evidence of dilitation. Venous: The inferior vena cava is dilated in size with greater than 50% respiratory variability, suggesting right atrial pressure of 8 mmHg. IAS/Shunts: No atrial level shunt detected by color flow Doppler.  LEFT VENTRICLE PLAX 2D LVIDd:         3.70 cm LVIDs:         2.40 cm LV PW:         1.10 cm LV IVS:        1.00 cm LVOT diam:     1.80 cm LV SV:         48 LV SV Index:   27 LVOT Area:     2.54 cm  RIGHT VENTRICLE TAPSE (M-mode): 1.9 cm LEFT ATRIUM           Index        RIGHT ATRIUM           Index LA diam:      4.20 cm 2.36 cm/m   RA Area:  13.00 cm LA Vol (A2C): 51.6 ml 29.05 ml/m  RA Volume:   31.20 ml  17.57 ml/m LA Vol (A4C): 88.0 ml 49.56 ml/m  AORTIC VALVE LVOT Vmax:   113.00 cm/s LVOT Vmean:  73.300 cm/s LVOT VTI:    0.190 m AI PHT:      748 msec  AORTA Ao Asc diam: 3.10 cm TRICUSPID VALVE TR Peak grad:   29.8 mmHg TR Vmax:        273.00 cm/s  SHUNTS Systemic VTI:  0.19 m Systemic Diam: 1.80 cm Mihai Croitoru MD Electronically signed by Thurmon Fair MD Signature Date/Time: 11/01/2020/1:18:45 PM    Final         Scheduled Meds:  aspirin EC  81 mg Oral QPM   diltiazem  120 mg Oral Daily   docusate sodium  100 mg Oral BID   DULoxetine  90 mg Oral Daily   enoxaparin (LOVENOX) injection  40 mg Subcutaneous Q24H   LORazepam  1 mg Intravenous Once   metoprolol tartrate  25 mg Oral BID   pantoprazole  40 mg Oral Daily   Continuous Infusions:  cefTRIAXone (ROCEPHIN)  IV 2 g (11/02/20 1336)     LOS: 4 days    Time spent: 35 minutes.     Alba Cory, MD [facility]   If 7PM-7AM, please contact night-coverage [email or web address]  11/02/2020, 2:36 PM

## 2020-11-03 ENCOUNTER — Inpatient Hospital Stay (HOSPITAL_COMMUNITY): Payer: Medicare Other

## 2020-11-03 DIAGNOSIS — I4821 Permanent atrial fibrillation: Secondary | ICD-10-CM | POA: Diagnosis not present

## 2020-11-03 DIAGNOSIS — R6520 Severe sepsis without septic shock: Secondary | ICD-10-CM | POA: Diagnosis not present

## 2020-11-03 DIAGNOSIS — A419 Sepsis, unspecified organism: Secondary | ICD-10-CM | POA: Diagnosis not present

## 2020-11-03 DIAGNOSIS — I10 Essential (primary) hypertension: Secondary | ICD-10-CM | POA: Diagnosis not present

## 2020-11-03 LAB — CBC
HCT: 30.4 % — ABNORMAL LOW (ref 36.0–46.0)
Hemoglobin: 9.8 g/dL — ABNORMAL LOW (ref 12.0–15.0)
MCH: 27.5 pg (ref 26.0–34.0)
MCHC: 32.2 g/dL (ref 30.0–36.0)
MCV: 85.2 fL (ref 80.0–100.0)
Platelets: 293 10*3/uL (ref 150–400)
RBC: 3.57 MIL/uL — ABNORMAL LOW (ref 3.87–5.11)
RDW: 15 % (ref 11.5–15.5)
WBC: 11.1 10*3/uL — ABNORMAL HIGH (ref 4.0–10.5)
nRBC: 0 % (ref 0.0–0.2)

## 2020-11-03 LAB — BASIC METABOLIC PANEL
Anion gap: 11 (ref 5–15)
BUN: 9 mg/dL (ref 8–23)
CO2: 19 mmol/L — ABNORMAL LOW (ref 22–32)
Calcium: 8.1 mg/dL — ABNORMAL LOW (ref 8.9–10.3)
Chloride: 103 mmol/L (ref 98–111)
Creatinine, Ser: 1.02 mg/dL — ABNORMAL HIGH (ref 0.44–1.00)
GFR, Estimated: 55 mL/min — ABNORMAL LOW (ref >60)
Glucose, Bld: 90 mg/dL (ref 70–99)
Potassium: 2.9 mmol/L — ABNORMAL LOW (ref 3.5–5.1)
Sodium: 133 mmol/L — ABNORMAL LOW (ref 135–145)

## 2020-11-03 LAB — SEDIMENTATION RATE: Sed Rate: 64 mm/hr — ABNORMAL HIGH (ref 0–22)

## 2020-11-03 LAB — CULTURE, BLOOD (ROUTINE X 2)
Culture: NO GROWTH
Special Requests: ADEQUATE

## 2020-11-03 LAB — C-REACTIVE PROTEIN: CRP: 22.9 mg/dL — ABNORMAL HIGH (ref <1.0)

## 2020-11-03 LAB — TSH: TSH: 3.168 u[IU]/mL (ref 0.350–4.500)

## 2020-11-03 LAB — MAGNESIUM: Magnesium: 1.7 mg/dL (ref 1.7–2.4)

## 2020-11-03 MED ORDER — SODIUM CHLORIDE 0.9 % IV SOLN: INTRAVENOUS | Status: AC

## 2020-11-03 MED ORDER — METOPROLOL SUCCINATE ER 50 MG PO TB24
50.0000 mg | ORAL_TABLET | Freq: Every day | ORAL | Status: DC
  Administered 2020-11-04 – 2020-11-12 (×8): 50 mg via ORAL
  Filled 2020-11-03 (×8): qty 1

## 2020-11-03 MED ORDER — NYSTATIN 100000 UNIT/ML MT SUSP
5.0000 mL | Freq: Four times a day (QID) | OROMUCOSAL | Status: DC
  Administered 2020-11-03 – 2020-11-14 (×36): 500000 [IU] via ORAL
  Filled 2020-11-03 (×35): qty 5

## 2020-11-03 MED ORDER — POTASSIUM CHLORIDE 10 MEQ/100ML IV SOLN
10.0000 meq | INTRAVENOUS | Status: AC
  Administered 2020-11-03 (×2): 10 meq via INTRAVENOUS
  Filled 2020-11-03: qty 100

## 2020-11-03 MED ORDER — POTASSIUM CHLORIDE CRYS ER 20 MEQ PO TBCR
40.0000 meq | EXTENDED_RELEASE_TABLET | Freq: Once | ORAL | Status: AC
  Administered 2020-11-03: 40 meq via ORAL
  Filled 2020-11-03: qty 2

## 2020-11-03 NOTE — Progress Notes (Signed)
Progress Note     Subjective: Patient irritated at having to roll over for me to examine. She denies pain in R buttock. She does not want anyone to do anything to it.   Objective: Vital signs in last 24 hours: Temp:  [97.8 F (36.6 C)-98.9 F (37.2 C)] 98.3 F (36.8 C) (10/10 1100) Pulse Rate:  [79-100] 79 (10/10 1100) Resp:  [15-20] 19 (10/10 1100) BP: (102-132)/(48-74) 102/61 (10/10 1100) SpO2:  [96 %-100 %] 96 % (10/10 1100) Last BM Date: 11/01/20  Intake/Output from previous day: 10/09 0701 - 10/10 0700 In: 400 [P.O.:400] Out: 1000 [Urine:1000] Intake/Output this shift: No intake/output data recorded.  PE: Skin: right gluteal area with a small area of outpouching that is difficult to correspond to the CT scan or not.  There is no erythema, warmth, or evidence externally of abscess.   Lab Results:  Recent Labs    11/01/20 0753 11/02/20 0746  WBC 14.7* 13.2*  HGB 9.7* 10.1*  HCT 30.0* 32.0*  PLT 217 231   BMET Recent Labs    11/02/20 0746 11/03/20 0907  NA 130* 133*  K 3.5 2.9*  CL 101 103  CO2 18* 19*  GLUCOSE 77 90  BUN 7* 9  CREATININE 1.09* 1.02*  CALCIUM 8.1* 8.1*   PT/INR No results for input(s): LABPROT, INR in the last 72 hours. CMP     Component Value Date/Time   NA 133 (L) 11/03/2020 0907   K 2.9 (L) 11/03/2020 0907   CL 103 11/03/2020 0907   CO2 19 (L) 11/03/2020 0907   GLUCOSE 90 11/03/2020 0907   BUN 9 11/03/2020 0907   CREATININE 1.02 (H) 11/03/2020 0907   CREATININE 0.95 (H) 12/06/2017 1605   CALCIUM 8.1 (L) 11/03/2020 0907   PROT 5.2 (L) 10/30/2020 0558   ALBUMIN 2.4 (L) 10/30/2020 0558   AST 21 10/30/2020 0558   ALT 18 10/30/2020 0558   ALKPHOS 61 10/30/2020 0558   BILITOT 1.0 10/30/2020 0558   GFRNONAA 55 (L) 11/03/2020 0907   GFRNONAA 67 06/23/2017 1045   GFRAA 77 06/23/2017 1045   Lipase     Component Value Date/Time   LIPASE 9 (L) 08/09/2012 1319       Studies/Results: DG Chest 1 View  Result Date:  11/02/2020 CLINICAL DATA:  Fever, confusion EXAM: CHEST  1 VIEW COMPARISON:  10/29/2020 FINDINGS: Single frontal view of the chest was obtained with the patient rotated to the left. The cardiac silhouette is unremarkable. No acute airspace disease, effusion, or pneumothorax. No acute bony abnormalities. IMPRESSION: 1. No acute intrathoracic process. Electronically Signed   By: Sharlet Salina M.D.   On: 11/02/2020 18:50   CT HEAD WO CONTRAST ( )  Result Date: 11/03/2020 CLINICAL DATA:  Delirium EXAM: CT HEAD WITHOUT CONTRAST TECHNIQUE: Contiguous axial images were obtained from the base of the skull through the vertex without intravenous contrast. COMPARISON:  None. FINDINGS: Brain: There is no mass, hemorrhage or extra-axial collection. The size and configuration of the ventricles and extra-axial CSF spaces are normal. There is hypoattenuation of the white matter, most commonly indicating chronic small vessel disease. Vascular: No abnormal hyperdensity of the major intracranial arteries or dural venous sinuses. No intracranial atherosclerosis. Skull: The visualized skull base, calvarium and extracranial soft tissues are normal. Sinuses/Orbits: No fluid levels or advanced mucosal thickening of the visualized paranasal sinuses. No mastoid or middle ear effusion. The orbits are normal. IMPRESSION: Chronic small vessel disease without acute intracranial abnormality. Electronically Signed  By: Deatra Robinson M.D.   On: 11/03/2020 00:36    Anti-infectives: Anti-infectives (From admission, onward)    Start     Dose/Rate Route Frequency Ordered Stop   10/31/20 1630  vancomycin (VANCOCIN) IVPB 1000 mg/200 mL premix  Status:  Discontinued        1,000 mg 200 mL/hr over 60 Minutes Intravenous Every 48 hours 10/30/20 1056 10/30/20 1350   10/31/20 1200  cefTRIAXone (ROCEPHIN) 1 g in sodium chloride 0.9 % 100 mL IVPB  Status:  Discontinued        1 g 200 mL/hr over 30 Minutes Intravenous Every 24 hours 10/31/20  0812 10/31/20 0936   10/31/20 1200  cefTRIAXone (ROCEPHIN) 2 g in sodium chloride 0.9 % 100 mL IVPB        2 g 200 mL/hr over 30 Minutes Intravenous Every 24 hours 10/31/20 0936     10/30/20 1930  aztreonam (AZACTAM) 1 g in sodium chloride 0.9 % 100 mL IVPB  Status:  Discontinued        1 g 200 mL/hr over 30 Minutes Intravenous Every 8 hours 10/30/20 1857 10/31/20 0812   10/30/20 0000  aztreonam (AZACTAM) 2 g in sodium chloride 0.9 % 100 mL IVPB  Status:  Discontinued        2 g 200 mL/hr over 30 Minutes Intravenous Every 8 hours 10/29/20 1829 10/30/20 1350   10/29/20 1515  aztreonam (AZACTAM) 2 g in sodium chloride 0.9 % 100 mL IVPB        2 g 200 mL/hr over 30 Minutes Intravenous  Once 10/29/20 1502 10/29/20 1617   10/29/20 1515  metroNIDAZOLE (FLAGYL) IVPB 500 mg        500 mg 100 mL/hr over 60 Minutes Intravenous  Once 10/29/20 1502 10/29/20 1740   10/29/20 1515  vancomycin (VANCOREADY) IVPB 1250 mg/250 mL        1,250 mg 166.7 mL/hr over 90 Minutes Intravenous  Once 10/29/20 1502 10/29/20 1814        Assessment/Plan Chronic right gluteal fluid collection - This was aspirated 3 years ago by IR and was noted to be bloody with no growth.  Doubt this is abscess as it is not warm, erythematous, or painful.  - would not recommend surgical drainage and I don't think this is the source of fevers or leukocytosis - if patient develops cellulitis overlying, can consider IR consult for aspiration - patient has a urine culture from 10/5 with multiple species present, would recommend recollection  - surgery will sign off   LOS: 5 days    Juliet Rude, [facility] 11/03/2020, 11:24 AM Please see Amion for pager number during day hours 7:00am-4:30pm

## 2020-11-03 NOTE — Progress Notes (Signed)
[facility] Infectious Diseases Follow Up Note  Patient Identification: Patient Name: Andrea Yu MRN: [id number] Admit Date: 10/29/2020  1:41 PM Age: 80 y.o.Today's Date: 11/03/2020   Reason for Visit: Streptococcus bacteremia  Principal Problem:   Severe sepsis [facility]) Active Problems:   Adrenal insufficiency (HCC)   PAF (paroxysmal atrial fibrillation) (HCC)   Psoriatic arthritis (HCC)   Iron deficiency anemia- transfused this admission   Depression   Peripheral neuropathy   Obesity (BMI 30-39.9)- suspected sleep apnea, declines sleep study   SAH (subarachnoid hemorrhage) (HCC)   Essential hypertension   GERD (gastroesophageal reflux disease)   Antibiotics: Aztreonam 10/5-10/6                    Metronidazole 10/5                    Vancomycin 10/5                    Ceftriaxone 10/7-current  Lines/Hardware:   Interval Events: febrile on 10/9, Leukocytosis is downtrending; repeat CT head without contrast with no acute abnormality   Assessment:  Streptococcal Parasanguinis 1/4 bottles  Intermittent fevers  3.   Encephalopathy ? Improving: she is able to tell me her name, name of the hospital, month and year. She is able to follow commands. CT head wo 10/10 with no acute changes or concern for septic emboli.   Comments: Unclear source of fever. Also unclear if strep isolated in 1/4 bottles is a contaminant vs a true pathogen ( given fevers and altered mental status which seems to be somewhat improving based on documentation on on prior notes). TTE is negative for endocarditis.   Recommendations Continue ceftriaxone as is, have requested for sensi on Strep parasanguinis Would get a TEE to help determine duration of antibiotics and r/o endocarditis given her overall clinical picture and association of strep parasanguinis with endocarditis.  Surgery has been consulted for re-evaluation of RT buttock, will follow up recs   Monitor fever curve, WBC  Rest of the management as per the primary team. Thank you for the consult. Please page with pertinent questions or concerns.  ______________________________________________________________________ Subjective patient seen and examined at the bedside. She is lying in the bed, She is able to tell me her name, name of hospital, month and years. She is able to follow coomands. She denies any pain or tenderness in the rt buttock    Vitals BP (!) 110/59 (BP Location: Right Arm)   Pulse 87   Temp 98.9 F (37.2 C) (Oral)   Resp 20   Ht 4\' 11"  (1.499 m)   Wt 83 kg   SpO2 99%   BMI 36.96 kg/m     Physical Exam Constitutional:  Not in acute distress, lying in the bed.     Comments:   Cardiovascular:     Rate and Rhythm: Normal rate and regular rhythm.     Heart sounds:   Pulmonary:     Effort: Pulmonary effort is normal.     Comments:   Abdominal:     Palpations: Abdomen is soft.     Tenderness: Non tender and non distended   Musculoskeletal:        General: No swelling or tenderness. Rt buttock - no erythema/tenderness or warmth and no fluctuance  Skin:    Comments: No obvious lesions or rashes   Neurological:     General: able to move all extremities spontaneously  Psychiatric:  Mood and Affect: Mood normal.     Pertinent Microbiology Results for orders placed or performed during the hospital encounter of 10/29/20  Blood Culture (routine x 2)     Status: Abnormal   Collection Time: 10/29/20 10:45 AM   Specimen: BLOOD  Result Value Ref Range Status   Specimen Description BLOOD BLOOD RIGHT FOREARM  Final   Special Requests   Final    BOTTLES DRAWN AEROBIC AND ANAEROBIC Blood Culture adequate volume   Culture  Setup Time   Final    GRAM POSITIVE COCCI AEROBIC BOTTLE ONLY CRITICAL RESULT CALLED TO, READ BACK BY AND VERIFIED WITH: HURTH PHARMD @0920  10/31/20 EB    Culture (A)  Final    STREPTOCOCCUS PARASANGUINIS THE SIGNIFICANCE  OF ISOLATING THIS ORGANISM FROM A SINGLE SET OF BLOOD CULTURES WHEN MULTIPLE SETS ARE DRAWN IS UNCERTAIN. PLEASE NOTIFY THE MICROBIOLOGY DEPARTMENT WITHIN ONE WEEK IF SPECIATION AND SENSITIVITIES ARE REQUIRED. Performed at [facility], [address], [place], [place]    Report Status 11/01/2020 FINAL  Final  Blood Culture ID Panel (Reflexed)     Status: Abnormal   Collection Time: 10/29/20 10:45 AM  Result Value Ref Range Status   Enterococcus faecalis NOT DETECTED NOT DETECTED Final   Enterococcus Faecium NOT DETECTED NOT DETECTED Final   Listeria monocytogenes NOT DETECTED NOT DETECTED Final   Staphylococcus species NOT DETECTED NOT DETECTED Final   Staphylococcus aureus (BCID) NOT DETECTED NOT DETECTED Final   Staphylococcus epidermidis NOT DETECTED NOT DETECTED Final   Staphylococcus lugdunensis NOT DETECTED NOT DETECTED Final   Streptococcus species DETECTED (A) NOT DETECTED Final    Comment: Not Enterococcus species, Streptococcus agalactiae, Streptococcus pyogenes, or Streptococcus pneumoniae. CRITICAL RESULT CALLED TO, READ BACK BY AND VERIFIED WITH: HURTH PHARMD @0920  10/31/20 EB    Streptococcus agalactiae NOT DETECTED NOT DETECTED Final   Streptococcus pneumoniae NOT DETECTED NOT DETECTED Final   Streptococcus pyogenes NOT DETECTED NOT DETECTED Final   A.calcoaceticus-baumannii NOT DETECTED NOT DETECTED Final   Bacteroides fragilis NOT DETECTED NOT DETECTED Final   Enterobacterales NOT DETECTED NOT DETECTED Final   Enterobacter cloacae complex NOT DETECTED NOT DETECTED Final   Escherichia coli NOT DETECTED NOT DETECTED Final   Klebsiella aerogenes NOT DETECTED NOT DETECTED Final   Klebsiella oxytoca NOT DETECTED NOT DETECTED Final   Klebsiella pneumoniae NOT DETECTED NOT DETECTED Final   Proteus species NOT DETECTED NOT DETECTED Final   Salmonella species NOT DETECTED NOT DETECTED Final   Serratia marcescens NOT DETECTED NOT DETECTED Final   Haemophilus  influenzae NOT DETECTED NOT DETECTED Final   Neisseria meningitidis NOT DETECTED NOT DETECTED Final   Pseudomonas aeruginosa NOT DETECTED NOT DETECTED Final   Stenotrophomonas maltophilia NOT DETECTED NOT DETECTED Final   Candida albicans NOT DETECTED NOT DETECTED Final   Candida auris NOT DETECTED NOT DETECTED Final   Candida glabrata NOT DETECTED NOT DETECTED Final   Candida krusei NOT DETECTED NOT DETECTED Final   Candida parapsilosis NOT DETECTED NOT DETECTED Final   Candida tropicalis NOT DETECTED NOT DETECTED Final   Cryptococcus neoformans/gattii NOT DETECTED NOT DETECTED Final    Comment: Performed at [facility], [address], [place], [address] [place]  Resp Panel by RT-PCR (Flu A&B, Covid)     Status: None   Collection Time: 10/29/20  2:22 PM   Specimen: Nasopharyngeal(NP) swabs in vial transport medium  Result Value Ref Range Status   SARS Coronavirus 2 by RT PCR NEGATIVE NEGATIVE  Final    Comment: (NOTE) SARS-CoV-2 target nucleic acids are NOT DETECTED.  The SARS-CoV-2 RNA is generally detectable in upper respiratory specimens during the acute phase of infection. The lowest concentration of SARS-CoV-2 viral copies this assay can detect is 138 copies/mL. A negative result does not preclude SARS-Cov-2 infection and should not be used as the sole basis for treatment or other patient management decisions. A negative result may occur with  improper specimen collection/handling, submission of specimen other than nasopharyngeal swab, presence of viral mutation(s) within the areas targeted by this assay, and inadequate number of viral copies(<138 copies/mL). A negative result must be combined with clinical observations, patient history, and epidemiological information. The expected result is Negative.  Fact Sheet for Patients:  BloggerCourse.com  Fact Sheet for Healthcare Providers:  SeriousBroker.it  This test is  no t yet approved or cleared by the [place] FDA and  has been authorized for detection and/or diagnosis of SARS-CoV-2 by FDA under an Emergency Use Authorization (EUA). This EUA will remain  in effect (meaning this test can be used) for the duration of the COVID-19 declaration under Section 564(b)(1) of the Act, 21 U.S.C.section 360bbb-3(b)(1), unless the authorization is terminated  or revoked sooner.       Influenza A by PCR NEGATIVE NEGATIVE Final   Influenza B by PCR NEGATIVE NEGATIVE Final    Comment: (NOTE) The Xpert Xpress SARS-CoV-2/FLU/RSV plus assay is intended as an aid in the diagnosis of influenza from Nasopharyngeal swab specimens and should not be used as a sole basis for treatment. Nasal washings and aspirates are unacceptable for Xpert Xpress SARS-CoV-2/FLU/RSV testing.  Fact Sheet for Patients: BloggerCourse.com  Fact Sheet for Healthcare Providers: SeriousBroker.it  This test is not yet approved or cleared by the [place] FDA and has been authorized for detection and/or diagnosis of SARS-CoV-2 by FDA under an Emergency Use Authorization (EUA). This EUA will remain in effect (meaning this test can be used) for the duration of the COVID-19 declaration under Section 564(b)(1) of the Act, 21 U.S.C. section 360bbb-3(b)(1), unless the authorization is terminated or revoked.  Performed at [facility], [address], [place], [place] [address]   Blood Culture (routine x 2)     Status: None   Collection Time: 10/29/20  2:28 PM   Specimen: BLOOD  Result Value Ref Range Status   Specimen Description BLOOD SITE NOT SPECIFIED  Final   Special Requests   Final    BOTTLES DRAWN AEROBIC AND ANAEROBIC Blood Culture adequate volume   Culture   Final    NO GROWTH 5 DAYS Performed at [facility], [address] [place], [place] [address]    Report Status 11/03/2020 FINAL  Final  Urine Culture      Status: Abnormal   Collection Time: 10/29/20  3:45 PM   Specimen: In/Out Cath Urine  Result Value Ref Range Status   Specimen Description IN/OUT CATH URINE  Final   Special Requests   Final    NONE Performed at [facility], [address] [place], [place] [address]    Culture MULTIPLE SPECIES PRESENT, SUGGEST RECOLLECTION (A)  Final   Report Status 10/30/2020 FINAL  Final  Culture, blood (routine x 2)     Status: None (Preliminary result)   Collection Time: 10/31/20  2:30 PM   Specimen: BLOOD  Result Value Ref Range Status   Specimen Description BLOOD RIGHT ANTECUBITAL  Final   Special Requests   Final  BOTTLES DRAWN AEROBIC ONLY Blood Culture results may not be optimal due to an inadequate volume of blood received in culture bottles   Culture   Final    NO GROWTH 3 DAYS Performed at [facility], [address], [place], [place] [address]    Report Status PENDING  Incomplete  Culture, blood (routine x 2)     Status: None (Preliminary result)   Collection Time: 10/31/20  2:35 PM   Specimen: BLOOD RIGHT HAND  Result Value Ref Range Status   Specimen Description BLOOD RIGHT HAND  Final   Special Requests   Final    BOTTLES DRAWN AEROBIC ONLY Blood Culture results may not be optimal due to an inadequate volume of blood received in culture bottles   Culture   Final    NO GROWTH 3 DAYS Performed at [facility], [address], [place], [place] [address]    Report Status PENDING  Incomplete    Pertinent Lab. CBC Latest Ref Rng & Units 11/02/2020 11/01/2020 10/31/2020  WBC 4.0 - 10.5 K/uL 13.2(H) 14.7(H) 16.0(H)  Hemoglobin 12.0 - 15.0 g/dL 10.1(L) 9.7(L) 9.4(L)  Hematocrit 36.0 - 46.0 % 32.0(L) 30.0(L) 29.6(L)  Platelets 150 - 400 K/uL 231 217 184   CMP Latest Ref Rng & Units 11/02/2020 11/01/2020 10/31/2020  Glucose 70 - 99 mg/dL 77 78 83  BUN 8 - 23 mg/dL 7(L) 9 10  Creatinine 5.42 - 1.00 mg/dL 7.06(C) 3.76 2.83  Sodium 135 - 145 mmol/L 130(L) 131(L)  133(L)  Potassium 3.5 - 5.1 mmol/L 3.5 3.5 3.4(L)  Chloride 98 - 111 mmol/L 101 101 103  CO2 22 - 32 mmol/L 18(L) 18(L) 18(L)  Calcium 8.9 - 10.3 mg/dL 8.1(L) 8.0(L) 8.1(L)  Total Protein 6.5 - 8.1 g/dL - - -  Total Bilirubin 0.3 - 1.2 mg/dL - - -  Alkaline Phos 38 - 126 U/L - - -  AST 15 - 41 U/L - - -  ALT 0 - 44 U/L - - -     Pertinent Imaging today Plain films and CT images have been personally visualized and interpreted; radiology reports have been reviewed. Decision making incorporated into the Impression / Recommendations.  Chest Xray 11/02/2020 FINDINGS: Single frontal view of the chest was obtained with the patient rotated to the left. The cardiac silhouette is unremarkable. No acute airspace disease, effusion, or pneumothorax. No acute bony abnormalities.   IMPRESSION: 1. No acute intrathoracic process.   CT head WO contrast 11/03/2020 FINDINGS: Brain: There is no mass, hemorrhage or extra-axial collection. The size and configuration of the ventricles and extra-axial CSF spaces are normal. There is hypoattenuation of the white matter, most commonly indicating chronic small vessel disease.   Vascular: No abnormal hyperdensity of the major intracranial arteries or dural venous sinuses. No intracranial atherosclerosis.   Skull: The visualized skull base, calvarium and extracranial soft tissues are normal.   Sinuses/Orbits: No fluid levels or advanced mucosal thickening of the visualized paranasal sinuses. No mastoid or middle ear effusion. The orbits are normal.   IMPRESSION: Chronic small vessel disease without acute intracranial abnormality.  TTE 11/01/2020 Left ventricular ejection fraction, by estimation, is 60 to 65%. The left ventricle has normal function. The left ventricle has no regional wall motion abnormalities. Left ventricular diastolic function could not be evaluated. 1. Right ventricular systolic function is normal. The right ventricular size is  normal. There is mildly elevated pulmonary artery systolic pressure. 2. 3. Left atrial size was mildly dilated.  4. The mitral valve is normal in structure. Trivial mitral valve regurgitation. The aortic valve is normal in structure. Aortic valve regurgitation is mild. Mild aortic valve sclerosis is present, with no evidence of aortic valve stenosis. 5. The inferior vena cava is dilated in size with >50% respiratory variability, suggesting right atrial pressure of 8 mmHg. 6. Conclusion(s)/Recommendation(s): No evidence of valvular vegetations on this transthoracic echocardiogram. Would recommend a transesophageal echocardiogram to exclude infective endocarditis if clinically indicated.  I spent more than 35 minutes for this patient encounter including review of prior medical records, coordination of care  with greater than 50% of time being face to face/counseling and discussing diagnostics/treatment plan with the patient/family.  Electronically signed by:   Odette Fraction, MD Infectious Disease Physician [facility] for Infectious Disease Pager: [phone]

## 2020-11-03 NOTE — Social Work (Signed)
CSW called and LVM with patients daughter Clydie Braun.   Jimmy Picket, LCSW Clinical Social Worker

## 2020-11-03 NOTE — Progress Notes (Signed)
Progress Note  Patient Name: Andrea Yu Date of Encounter: 11/03/2020  [facility] Cardiologist: Thurmon Fair, MD   Subjective   Stuporous, but can be awoken.  Alert enough to tell me that "I am wet" and knows that she is "at [facility]  Appears comfortable.  Inpatient Medications    Scheduled Meds:  aspirin EC  81 mg Oral QPM   diltiazem  120 mg Oral Daily   docusate sodium  100 mg Oral BID   DULoxetine  90 mg Oral Daily   enoxaparin (LOVENOX) injection  40 mg Subcutaneous Q24H   LORazepam  1 mg Intravenous Once   metoprolol tartrate  25 mg Oral BID   nystatin  5 mL Oral QID   pantoprazole  40 mg Oral Daily   Continuous Infusions:  sodium chloride 75 mL/hr at 11/03/20 0924   cefTRIAXone (ROCEPHIN)  IV 2 g (11/02/20 1336)   PRN Meds: acetaminophen **OR** acetaminophen, HYDROcodone-acetaminophen, ibuprofen, ondansetron **OR** ondansetron (ZOFRAN) IV   Vital Signs    Vitals:   11/02/20 1954 11/02/20 2335 11/03/20 0300 11/03/20 0800  BP: 132/74 (!) 122/55 (!) 111/57 (!) 110/59  Pulse: 93 100 100 87  Resp: 20 20 20 20   Temp: 98.2 F (36.8 C) 98.2 F (36.8 C) 98.4 F (36.9 C) 98.9 F (37.2 C)  TempSrc: Oral Oral Oral Oral  SpO2: 100% 97% 96% 99%  Weight:      Height:        Intake/Output Summary (Last 24 hours) at 11/03/2020 1047 Last data filed at 11/02/2020 1500 Gross per 24 hour  Intake --  Output 1000 ml  Net -1000 ml   Last 3 Weights 10/30/2020 12/13/2019 05/25/2018  Weight (lbs) 182 lb 15.7 oz 183 lb 144 lb  Weight (kg) 83 kg 83.008 kg 65.318 kg      Telemetry    Atrial fibrillation with controlled ventricular rate- Personally Reviewed  ECG    Atrial fibrillation- Personally Reviewed  Physical Exam  Stuporous, calm GEN: No acute distress.   Neck: No JVD Cardiac: Irregular, no murmurs, rubs, or gallops.  Respiratory: Clear to auscultation bilaterally. GI: Soft, nontender, non-distended  MS: No edema; No deformity. Neuro:   Nonfocal  Psych: Normal affect   Labs    High Sensitivity Troponin:  No results for input(s): TROPONINIHS in the last 720 hours.   Chemistry Recent Labs  Lab 10/29/20 1428 10/29/20 1629 10/30/20 0558 10/31/20 0159 11/01/20 0753 11/02/20 0746 11/03/20 0907  NA 133*   < > 133*   < > 131* 130* 133*  K 5.1   < > 3.2*   < > 3.5 3.5 2.9*  CL 103   < > 100   < > 101 101 103  CO2 18*  --  20*   < > 18* 18* 19*  GLUCOSE 105*   < > 82   < > 78 77 90  BUN 24*   < > 14   < > 9 7* 9  CREATININE 1.22*   < > 1.06*   < > 0.96 1.09* 1.02*  CALCIUM 9.0  --  8.3*   < > 8.0* 8.1* 8.1*  PROT 6.7  --  5.2*  --   --   --   --   ALBUMIN 2.9*  --  2.4*  --   --   --   --   AST 22  --  21  --   --   --   --  ALT 19  --  18  --   --   --   --   ALKPHOS 76  --  61  --   --   --   --   BILITOT 0.9  --  1.0  --   --   --   --   GFRNONAA 45*   < > 53*   < > 59* 51* 55*  ANIONGAP 12  --  13   < > 12 11 11    < > = values in this interval not displayed.    Lipids No results for input(s): CHOL, TRIG, HDL, LABVLDL, LDLCALC, CHOLHDL in the last 168 hours.  Hematology Recent Labs  Lab 10/31/20 0159 11/01/20 0753 11/02/20 0746  WBC 16.0* 14.7* 13.2*  RBC 3.34* 3.47* 3.62*  HGB 9.4* 9.7* 10.1*  HCT 29.6* 30.0* 32.0*  MCV 88.6 86.5 88.4  MCH 28.1 28.0 27.9  MCHC 31.8 32.3 31.6  RDW 15.0 15.0 15.0  PLT 184 217 231   Thyroid No results for input(s): TSH, FREET4 in the last 168 hours.  BNPNo results for input(s): BNP, PROBNP in the last 168 hours.  DDimer No results for input(s): DDIMER in the last 168 hours.   Radiology    DG Chest 1 View  Result Date: 11/02/2020 CLINICAL DATA:  Fever, confusion EXAM: CHEST  1 VIEW COMPARISON:  10/29/2020 FINDINGS: Single frontal view of the chest was obtained with the patient rotated to the left. The cardiac silhouette is unremarkable. No acute airspace disease, effusion, or pneumothorax. No acute bony abnormalities. IMPRESSION: 1. No acute intrathoracic process.  Electronically Signed   By: 12/29/2020 M.D.   On: 11/02/2020 18:50   CT HEAD WO CONTRAST (01/02/2021)  Result Date: 11/03/2020 CLINICAL DATA:  Delirium EXAM: CT HEAD WITHOUT CONTRAST TECHNIQUE: Contiguous axial images were obtained from the base of the skull through the vertex without intravenous contrast. COMPARISON:  None. FINDINGS: Brain: There is no mass, hemorrhage or extra-axial collection. The size and configuration of the ventricles and extra-axial CSF spaces are normal. There is hypoattenuation of the white matter, most commonly indicating chronic small vessel disease. Vascular: No abnormal hyperdensity of the major intracranial arteries or dural venous sinuses. No intracranial atherosclerosis. Skull: The visualized skull base, calvarium and extracranial soft tissues are normal. Sinuses/Orbits: No fluid levels or advanced mucosal thickening of the visualized paranasal sinuses. No mastoid or middle ear effusion. The orbits are normal. IMPRESSION: Chronic small vessel disease without acute intracranial abnormality. Electronically Signed   By: 01/03/2021 M.D.   On: 11/03/2020 00:36    Cardiac Studies   Echo 04/19/17   - Left ventricle: The cavity size was normal. Wall thickness was    normal. Systolic function was normal. The estimated ejection    fraction was in the range of 50% to 55%. Wall motion was normal;    there were no regional wall motion abnormalities.  - Aortic valve: There was mild regurgitation.  - Left atrium: The atrium was moderately dilated.  - Pulmonary arteries: Systolic pressure was mildly increased. PA    peak pressure: 40 mm Hg (S).   Impressions:   - Normal LV systolic function; mild AI; moderate LAE; mild TR; mild    pulmonary hypertension.   Patient Profile     80 y.o. female with permanent atrial fibrillation, essential hypertension, psoriatic arthritis, history of neuropathy and orthostatic hypotension, frequent falls with serious injuries including  subarachnoid hemorrhage, admitted with urinary tract infection and sepsis.  Assessment & Plan    1.  Permanent atrial fibrillation: Despite elevated CHA2DS2-VASc score 4 (age 50, gender, hypertension), not a candidate for anticoagulation due to frequent falls that have been complicated by intracranial hemorrhage.  Rate control is now good.  We will transition back to her usual prescription of metoprolol succinate 50 mg once daily and discontinue the diltiazem. 2.  Urosepsis with encephalopathy: Appears to be improving.  Still very groggy, but there may be some signs of improving mental status. 3.  HTN: BP in normal range, relatively low diastolic blood pressure.  Stop diltiazem.     For questions or updates, please contact [facility] Please consult [email or web address] for contact info under        Signed, Thurmon Fair, MD  11/03/2020, 10:47 AM

## 2020-11-03 NOTE — Care Management Important Message (Signed)
Important Message  Patient Details  Name: Andrea Yu MRN: [id number] Date of Birth: 1940/12/28   Medicare Important Message Given:  Yes     Renie Ora 11/03/2020, 10:13 AM

## 2020-11-03 NOTE — NC FL2 (Signed)
[place] MEDICAID FL2 LEVEL OF CARE SCREENING TOOL     IDENTIFICATION  Patient Name: Andrea Yu Birthdate: 09/20/1940 Sex: female Admission Date (Current Location): 10/29/2020  [facility] and [place] Number:  Producer, television/film/video and Address:  [facility], 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   Permanent atrial fibrillation (HCC) 05/17/2019   Dyslipidemia (high LDL; low HDL) 05/17/2019   S/P PICC central line placement 01/24/2018   Diarrhea 12/14/2017   Left buttock abscess 12/06/2017   Discitis of thoracic region 05/12/2017   Medication monitoring encounter 05/12/2017   Hx of adverse drug reaction    History of blood transfusion    GERD (gastroesophageal reflux disease)    Dysrhythmia    Complication of anesthesia    Arthritis    Anemia    Displaced spiral fracture of shaft of right tibia, subsequent encounter for closed fracture with delayed healing 11/11/2016   Status post ankle arthrodesis 01/07/2016   Osteomyelitis of pelvis (HCC)    SAH (subarachnoid hemorrhage) (HCC) 09/14/2014   Essential hypertension 09/14/2014   Obesity (BMI 30-39.9)- suspected sleep apnea, declines sleep study 11/10/2012   Psoriatic arthritis (HCC) 11/06/2012   Iron deficiency anemia- transfused this admission 11/06/2012   Depression 11/06/2012   Peripheral neuropathy 11/06/2012   Adrenal  insufficiency (HCC) 08/09/2012   PAF (paroxysmal atrial fibrillation) (HCC) 08/09/2012    Orientation RESPIRATION BLADDER Height & Weight     Self  Normal Incontinent Weight: 182 lb 15.7 oz (83 kg) Height:  4\' 11"  (149.9 cm)  BEHAVIORAL SYMPTOMS/MOOD NEUROLOGICAL BOWEL NUTRITION STATUS      Incontinent Diet  AMBULATORY STATUS COMMUNICATION OF NEEDS Skin   Extensive Assist Verbally PU Stage and Appropriate Care (buttocks)                       Personal Care Assistance Level of Assistance  Feeding, Dressing, Bathing Bathing Assistance: Maximum assistance Feeding assistance: Limited assistance Dressing Assistance: Maximum assistance     Functional Limitations Info  Sight, Hearing, Speech Sight Info: Adequate Hearing Info: Adequate      SPECIAL CARE FACTORS FREQUENCY  PT (By licensed PT), OT (By licensed OT)     PT Frequency: 5x a week OT Frequency: 5x a week            Contractures      Additional Factors Info  Code Status, Allergies Code Status Info: full Allergies Info: Penicillins   Prolia (Denosumab)   Ceftriaxone   Ciprofloxacin Hcl   Fosamax (Alendronate Sodium)   Latex   Lisinopril   Tubersol (Tuberculin Ppd)           Current Medications (11/03/2020):  This is the current hospital active medication list Current Facility-Administered Medications  Medication Dose Route Frequency Provider Last Rate Last Admin   0.9 %  sodium chloride infusion   Intravenous Continuous Regalado,  Belkys A, MD 75 mL/hr at 11/03/20 0924 New Bag at 11/03/20 0924   acetaminophen (TYLENOL) tablet 650 mg  650 mg Oral Q6H PRN Cipriano Bunker, MD   650 mg at 11/03/20 0802   Or   acetaminophen (TYLENOL) suppository 650 mg  650 mg Rectal Q6H PRN Cipriano Bunker, MD       aspirin EC tablet 81 mg  81 mg Oral QPM Cipriano Bunker, MD   81 mg at 11/02/20 1830   cefTRIAXone (ROCEPHIN) 2 g in sodium chloride 0.9 % 100 mL IVPB  2 g Intravenous Q24H Regalado, Belkys A, MD 200 mL/hr at 11/03/20  1158 2 g at 11/03/20 1158   docusate sodium (COLACE) capsule 100 mg  100 mg Oral BID Cipriano Bunker, MD   100 mg at 11/03/20 0802   DULoxetine (CYMBALTA) DR capsule 90 mg  90 mg Oral Daily Cipriano Bunker, MD   90 mg at 11/03/20 0802   enoxaparin (LOVENOX) injection 40 mg  40 mg Subcutaneous Q24H Mosetta Anis, RPH   40 mg at 11/02/20 1833   HYDROcodone-acetaminophen (NORCO/VICODIN) 5-325 MG per tablet 1 tablet  1 tablet Oral Q6H PRN Cipriano Bunker, MD   1 tablet at 11/02/20 2008   ibuprofen (ADVIL) tablet 600 mg  600 mg Oral Q6H PRN Chotiner, Claudean Severance, MD   600 mg at 10/30/20 0343   [START ON 11/04/2020] metoprolol succinate (TOPROL-XL) 24 hr tablet 50 mg  50 mg Oral Daily Croitoru, Mihai, MD       metoprolol tartrate (LOPRESSOR) tablet 25 mg  25 mg Oral BID Croitoru, Mihai, MD   25 mg at 11/03/20 0802   nystatin (MYCOSTATIN) 100000 UNIT/ML suspension 500,000 Units  5 mL Oral QID Regalado, Belkys A, MD   500,000 Units at 11/03/20 1457   ondansetron (ZOFRAN) tablet 4 mg  4 mg Oral Q6H PRN Cipriano Bunker, MD       Or   ondansetron [facility]) injection 4 mg  4 mg Intravenous Q6H PRN Cipriano Bunker, MD   4 mg at 10/29/20 1845   pantoprazole (PROTONIX) EC tablet 40 mg  40 mg Oral Daily Cipriano Bunker, MD   40 mg at 11/03/20 0802     Discharge Medications: Please see discharge summary for a list of discharge medications.  Relevant Imaging Results:  Relevant Lab Results:   Additional Information SSN [id number]  Jimmy Picket, LCSW

## 2020-11-03 NOTE — Progress Notes (Signed)
PROGRESS NOTE    RUE VALLADARES  AJO:[id number] DOB: 1940-10-12 DOA: 10/29/2020 PCP: Jarome Matin, MD   Brief Narrative: 80 year old with past medical history significant for paroxysmal A. fib, GERD, neuropathy, morbid obesity, psoriatic arthritis, iron deficiency anemia, history of subarachnoid hemorrhage, hypertension who presents to the ED complaining of the urinary frequency and decreased oral intake.  Patient report having UTI and was prescribed Bactrim by her PCP.  She completed course and despite still continues to have dysuria.  Patient was noted to be slightly confused. Evaluation in the ED patient was tachycardic, hypotensive, tachypneic, febrile.  Leukocytosis white blood cell 15.3.  UA moderate leukocyte.  CT abdomen show fluid collection inferior portion of the right buttock region inferior to the right ischial tuberosity.    Assessment & Plan:   Principal Problem:   Severe sepsis (HCC) Active Problems:   Adrenal insufficiency (HCC)   PAF (paroxysmal atrial fibrillation) (HCC)   Psoriatic arthritis (HCC)   Iron deficiency anemia- transfused this admission   Depression   Peripheral neuropathy   Obesity (BMI 30-39.9)- suspected sleep apnea, declines sleep study   SAH (subarachnoid hemorrhage) (HCC)   Essential hypertension   GERD (gastroesophageal reflux disease)   1-Sepsis secondary to UTI, 1 blood culture positive for Streptococcus parasanguinis.  Severe sepsis ruled out .  Presented with fever tachycardia tachypnea and hypotension. Received IV fluids. Continue with  IV ceftriaxone.  Spike intermittent fever.  1-4 Blood culture positive for Streptococcus species. .  WBC down to 11. Repeated Blood culture 10/07: no growth to date.   2-question of right gluteal abscess by Ct scan: chronic fluid collection.  Present  with fever, hypotension concern for sepsis.  General surgery and ID has been consulted.  She had a prior history of abscess.  Per ID and surgery no  need for drainage. Less likely abscess.   3-Acute Metabolic Encephalopathy;  Patient was noticed to be not  very conversant, not following command, confused. She was febrile at that time.  Plan for MRI but patient decline MRI.  CT head no acute intracranial pathology.  Repeated CT scan after 24-48 hours symptoms negative for stroke.   A. fib with RVR: Cardiology consulted and following On oral Cardizem.  ECHO; normal EF>   Hypertension: Continue with Cardizem  Generalized weakness: PT OT  Depression Continue with Cymbalta. HypoKalemia: Replete oral and IV  Hyponatremia; received fluids.  Peripheral neuropathy: Continue with gabapentin   Pressure Injury 04/29/17 Stage II -  Partial thickness loss of dermis presenting as a shallow open ulcer with a red, pink wound bed without slough. healing callus, open pink wound bed (Active)  04/29/17 0800  Location: Heel  Location Orientation: Right  Staging: Stage II -  Partial thickness loss of dermis presenting as a shallow open ulcer with a red, pink wound bed without slough.  Wound Description (Comments): healing callus, open pink wound bed  Present on Admission:      Pressure Injury 10/30/20 Buttocks Right healed (Active)  10/30/20 2000  Location: Buttocks  Location Orientation: Right  Staging:   Wound Description (Comments): healed  Present on Admission: Yes        Estimated body mass index is 36.96 kg/m as calculated from the following:   Height as of this encounter: 4\' 11"  (1.499 m).   Weight as of this encounter: 83 kg.   DVT prophylaxis: Lovenox Code Status: Full code Family Communication: Daughter over phone 10/10 Disposition Plan:  Status is: Inpatient  Remains inpatient appropriate  because:IV treatments appropriate due to intensity of illness or inability to take PO  Dispo: The patient is from: Home              Anticipated d/c is to: Home              Patient currently is not medically stable to d/c.    Difficult to place patient No        Consultants:  ID General surgery  Procedures:    Antimicrobials:  Aztreonam  Subjective: She is alert, denies dyspnea.  Denies pain.   Objective: Vitals:   11/02/20 2335 11/03/20 0300 11/03/20 0800 11/03/20 1100  BP: (!) 122/55 (!) 111/57 (!) 110/59 102/61  Pulse: 100 100 87 79  Resp: 20 20 20 19   Temp: 98.2 F (36.8 C) 98.4 F (36.9 C) 98.9 F (37.2 C) 98.3 F (36.8 C)  TempSrc: Oral Oral Oral Oral  SpO2: 97% 96% 99% 96%  Weight:      Height:        Intake/Output Summary (Last 24 hours) at 11/03/2020 1745 Last data filed at 11/03/2020 1500 Gross per 24 hour  Intake 780 ml  Output --  Net 780 ml    Filed Weights   10/30/20 0700  Weight: 83 kg    Examination:  General exam: NAD Respiratory system: CTA Cardiovascular system: S 1, S 2 RRR Gastrointestinal system: BS present, soft, nt Central nervous system: alert, oriented to person and place, speech was clear.  Extremities: deformed, no edema    Data Reviewed: I have personally reviewed following labs and imaging studies  CBC: Recent Labs  Lab 10/29/20 1428 10/29/20 1629 10/29/20 2050 10/31/20 0159 11/01/20 0753 11/02/20 0746 11/03/20 0907  WBC 15.3*  --  10.5 16.0* 14.7* 13.2* 11.1*  NEUTROABS 12.1*  --   --   --   --   --   --   HGB 12.0   < > 9.9* 9.4* 9.7* 10.1* 9.8*  HCT 38.1   < > 31.9* 29.6* 30.0* 32.0* 30.4*  MCV 89.6  --  89.4 88.6 86.5 88.4 85.2  PLT 197  --  178 184 217 231 293   < > = values in this interval not displayed.    Basic Metabolic Panel: Recent Labs  Lab 10/30/20 0558 10/31/20 0159 11/01/20 0753 11/02/20 0746 11/03/20 0907  NA 133* 133* 131* 130* 133*  K 3.2* 3.4* 3.5 3.5 2.9*  CL 100 103 101 101 103  CO2 20* 18* 18* 18* 19*  GLUCOSE 82 83 78 77 90  BUN 14 10 9  7* 9  CREATININE 1.06* 0.91 0.96 1.09* 1.02*  CALCIUM 8.3* 8.1* 8.0* 8.1* 8.1*  MG  --   --   --   --  1.7    GFR: Estimated Creatinine Clearance:  40.4 mL/min (A) (by C-G formula based on SCr of 1.02 mg/dL (H)). Liver Function Tests: Recent Labs  Lab 10/29/20 1428 10/30/20 0558  AST 22 21  ALT 19 18  ALKPHOS 76 61  BILITOT 0.9 1.0  PROT 6.7 5.2*  ALBUMIN 2.9* 2.4*    No results for input(s): LIPASE, AMYLASE in the last 168 hours. No results for input(s): AMMONIA in the last 168 hours. Coagulation Profile: Recent Labs  Lab 10/29/20 1428 10/30/20 0558  INR 1.2 1.2    Cardiac Enzymes: No results for input(s): CKTOTAL, CKMB, CKMBINDEX, TROPONINI in the last 168 hours. BNP (last 3 results) No results for input(s): PROBNP in the last 8760 hours.  HbA1C: No results for input(s): HGBA1C in the last 72 hours. CBG: No results for input(s): GLUCAP in the last 168 hours. Lipid Profile: No results for input(s): CHOL, HDL, LDLCALC, TRIG, CHOLHDL, LDLDIRECT in the last 72 hours. Thyroid Function Tests: Recent Labs    11/03/20 0907  TSH 3.168   Anemia Panel: No results for input(s): VITAMINB12, FOLATE, FERRITIN, TIBC, IRON, RETICCTPCT in the last 72 hours. Sepsis Labs: Recent Labs  Lab 10/29/20 1428 10/29/20 1710 10/30/20 0558  PROCALCITON  --   --  1.20  LATICACIDVEN 1.0 1.8  --      Recent Results (from the past 240 hour(s))  Blood Culture (routine x 2)     Status: Abnormal (Preliminary result)   Collection Time: 10/29/20 10:45 AM   Specimen: BLOOD  Result Value Ref Range Status   Specimen Description BLOOD BLOOD RIGHT FOREARM  Final   Special Requests   Final    BOTTLES DRAWN AEROBIC AND ANAEROBIC Blood Culture adequate volume   Culture  Setup Time   Final    GRAM POSITIVE COCCI AEROBIC BOTTLE ONLY CRITICAL RESULT CALLED TO, READ BACK BY AND VERIFIED WITH: HURTH PHARMD  10/31/20 EB    Culture (A)  Final    STREPTOCOCCUS PARASANGUINIS CULTURE REINCUBATED FOR BETTER GROWTH Performed at [facility], [address], [place], [place] [address]    Report Status PENDING  Incomplete  Blood Culture  ID Panel (Reflexed)     Status: Abnormal   Collection Time: 10/29/20 10:45 AM  Result Value Ref Range Status   Enterococcus faecalis NOT DETECTED NOT DETECTED Final   Enterococcus Faecium NOT DETECTED NOT DETECTED Final   Listeria monocytogenes NOT DETECTED NOT DETECTED Final   Staphylococcus species NOT DETECTED NOT DETECTED Final   Staphylococcus aureus (BCID) NOT DETECTED NOT DETECTED Final   Staphylococcus epidermidis NOT DETECTED NOT DETECTED Final   Staphylococcus lugdunensis NOT DETECTED NOT DETECTED Final   Streptococcus species DETECTED (A) NOT DETECTED Final    Comment: Not Enterococcus species, Streptococcus agalactiae, Streptococcus pyogenes, or Streptococcus pneumoniae. CRITICAL RESULT CALLED TO, READ BACK BY AND VERIFIED WITH: HURTH PHARMD  10/31/20 EB    Streptococcus agalactiae NOT DETECTED NOT DETECTED Final   Streptococcus pneumoniae NOT DETECTED NOT DETECTED Final   Streptococcus pyogenes NOT DETECTED NOT DETECTED Final   A.calcoaceticus-baumannii NOT DETECTED NOT DETECTED Final   Bacteroides fragilis NOT DETECTED NOT DETECTED Final   Enterobacterales NOT DETECTED NOT DETECTED Final   Enterobacter cloacae complex NOT DETECTED NOT DETECTED Final   Escherichia coli NOT DETECTED NOT DETECTED Final   Klebsiella aerogenes NOT DETECTED NOT DETECTED Final   Klebsiella oxytoca NOT DETECTED NOT DETECTED Final   Klebsiella pneumoniae NOT DETECTED NOT DETECTED Final   Proteus species NOT DETECTED NOT DETECTED Final   Salmonella species NOT DETECTED NOT DETECTED Final   Serratia marcescens NOT DETECTED NOT DETECTED Final   Haemophilus influenzae NOT DETECTED NOT DETECTED Final   Neisseria meningitidis NOT DETECTED NOT DETECTED Final   Pseudomonas aeruginosa NOT DETECTED NOT DETECTED Final   Stenotrophomonas maltophilia NOT DETECTED NOT DETECTED Final   Candida albicans NOT DETECTED NOT DETECTED Final   Candida auris NOT DETECTED NOT DETECTED Final   Candida glabrata NOT  DETECTED NOT DETECTED Final   Candida krusei NOT DETECTED NOT DETECTED Final   Candida parapsilosis NOT DETECTED NOT DETECTED Final   Candida tropicalis NOT DETECTED NOT DETECTED Final   Cryptococcus neoformans/gattii NOT DETECTED NOT DETECTED Final    Comment: Performed  at [facility], [address] [place], [place] [address]  Resp Panel by RT-PCR (Flu A&B, Covid)     Status: None   Collection Time: 10/29/20  2:22 PM   Specimen: Nasopharyngeal(NP) swabs in vial transport medium  Result Value Ref Range Status   SARS Coronavirus 2 by RT PCR NEGATIVE NEGATIVE Final    Comment: (NOTE) SARS-CoV-2 target nucleic acids are NOT DETECTED.  The SARS-CoV-2 RNA is generally detectable in upper respiratory specimens during the acute phase of infection. The lowest concentration of SARS-CoV-2 viral copies this assay can detect is 138 copies/mL. A negative result does not preclude SARS-Cov-2 infection and should not be used as the sole basis for treatment or other patient management decisions. A negative result may occur with  improper specimen collection/handling, submission of specimen other than nasopharyngeal swab, presence of viral mutation(s) within the areas targeted by this assay, and inadequate number of viral copies(<138 copies/mL). A negative result must be combined with clinical observations, patient history, and epidemiological information. The expected result is Negative.  Fact Sheet for Patients:  BloggerCourse.com  Fact Sheet for Healthcare Providers:  SeriousBroker.it  This test is no t yet approved or cleared by the [place] FDA and  has been authorized for detection and/or diagnosis of SARS-CoV-2 by FDA under an Emergency Use Authorization (EUA). This EUA will remain  in effect (meaning this test can be used) for the duration of the COVID-19 declaration under Section 564(b)(1) of the Act, 21 U.S.C.section  360bbb-3(b)(1), unless the authorization is terminated  or revoked sooner.       Influenza A by PCR NEGATIVE NEGATIVE Final   Influenza B by PCR NEGATIVE NEGATIVE Final    Comment: (NOTE) The Xpert Xpress SARS-CoV-2/FLU/RSV plus assay is intended as an aid in the diagnosis of influenza from Nasopharyngeal swab specimens and should not be used as a sole basis for treatment. Nasal washings and aspirates are unacceptable for Xpert Xpress SARS-CoV-2/FLU/RSV testing.  Fact Sheet for Patients: BloggerCourse.com  Fact Sheet for Healthcare Providers: SeriousBroker.it  This test is not yet approved or cleared by the [place] FDA and has been authorized for detection and/or diagnosis of SARS-CoV-2 by FDA under an Emergency Use Authorization (EUA). This EUA will remain in effect (meaning this test can be used) for the duration of the COVID-19 declaration under Section 564(b)(1) of the Act, 21 U.S.C. section 360bbb-3(b)(1), unless the authorization is terminated or revoked.  Performed at [facility], [address] [place], [place] [address]   Blood Culture (routine x 2)     Status: None   Collection Time: 10/29/20  2:28 PM   Specimen: BLOOD  Result Value Ref Range Status   Specimen Description BLOOD SITE NOT SPECIFIED  Final   Special Requests   Final    BOTTLES DRAWN AEROBIC AND ANAEROBIC Blood Culture adequate volume   Culture   Final    NO GROWTH 5 DAYS Performed at [facility], [address], [place], [place] [address]    Report Status 11/03/2020 FINAL  Final  Urine Culture     Status: Abnormal   Collection Time: 10/29/20  3:45 PM   Specimen: In/Out Cath Urine  Result Value Ref Range Status   Specimen Description IN/OUT CATH URINE  Final   Special Requests   Final    NONE Performed at [facility], [address], [place], [place] [address]    Culture MULTIPLE SPECIES PRESENT, SUGGEST RECOLLECTION  (  A)  Final   Report Status 10/30/2020 FINAL  Final  Culture, blood (routine x 2)     Status: None (Preliminary result)   Collection Time: 10/31/20  2:30 PM   Specimen: BLOOD  Result Value Ref Range Status   Specimen Description BLOOD RIGHT ANTECUBITAL  Final   Special Requests   Final    BOTTLES DRAWN AEROBIC ONLY Blood Culture results may not be optimal due to an inadequate volume of blood received in culture bottles   Culture   Final    NO GROWTH 3 DAYS Performed at [facility], [address] [place], [place] [address]    Report Status PENDING  Incomplete  Culture, blood (routine x 2)     Status: None (Preliminary result)   Collection Time: 10/31/20  2:35 PM   Specimen: BLOOD RIGHT HAND  Result Value Ref Range Status   Specimen Description BLOOD RIGHT HAND  Final   Special Requests   Final    BOTTLES DRAWN AEROBIC ONLY Blood Culture results may not be optimal due to an inadequate volume of blood received in culture bottles   Culture   Final    NO GROWTH 3 DAYS Performed at [facility], [address], [place], [place] [address]    Report Status PENDING  Incomplete          Radiology Studies: DG Chest 1 View  Result Date: 11/02/2020 CLINICAL DATA:  Fever, confusion EXAM: CHEST  1 VIEW COMPARISON:  10/29/2020 FINDINGS: Single frontal view of the chest was obtained with the patient rotated to the left. The cardiac silhouette is unremarkable. No acute airspace disease, effusion, or pneumothorax. No acute bony abnormalities. IMPRESSION: 1. No acute intrathoracic process. Electronically Signed   By: Sharlet Salina M.D.   On: 11/02/2020 18:50   CT HEAD WO CONTRAST ( )  Result Date: 11/03/2020 CLINICAL DATA:  Delirium EXAM: CT HEAD WITHOUT CONTRAST TECHNIQUE: Contiguous axial images were obtained from the base of the skull through the vertex without intravenous contrast. COMPARISON:  None. FINDINGS: Brain: There is no mass, hemorrhage or extra-axial collection.  The size and configuration of the ventricles and extra-axial CSF spaces are normal. There is hypoattenuation of the white matter, most commonly indicating chronic small vessel disease. Vascular: No abnormal hyperdensity of the major intracranial arteries or dural venous sinuses. No intracranial atherosclerosis. Skull: The visualized skull base, calvarium and extracranial soft tissues are normal. Sinuses/Orbits: No fluid levels or advanced mucosal thickening of the visualized paranasal sinuses. No mastoid or middle ear effusion. The orbits are normal. IMPRESSION: Chronic small vessel disease without acute intracranial abnormality. Electronically Signed   By: Deatra Robinson M.D.   On: 11/03/2020 00:36        Scheduled Meds:  aspirin EC  81 mg Oral QPM   docusate sodium  100 mg Oral BID   DULoxetine  90 mg Oral Daily   enoxaparin (LOVENOX) injection  40 mg Subcutaneous Q24H   [START ON 11/04/2020] metoprolol succinate  50 mg Oral Daily   metoprolol tartrate  25 mg Oral BID   nystatin  5 mL Oral QID   pantoprazole  40 mg Oral Daily   potassium chloride  40 mEq Oral Once   Continuous Infusions:  cefTRIAXone (ROCEPHIN)  IV 2 g (11/03/20 1158)   potassium chloride       LOS: 5 days    Time spent: 35 minutes.     Alba Cory, MD [facility]   If  7PM-7AM, please contact night-coverage [email or web address]  11/03/2020, 5:45 PM

## 2020-11-03 NOTE — Progress Notes (Signed)
TEE scheduled on Friday 11/07/20 at 1PM, consent has been obtained by Dr Royann Shivers from patient's daughter today.

## 2020-11-04 ENCOUNTER — Inpatient Hospital Stay (HOSPITAL_COMMUNITY): Payer: Medicare Other

## 2020-11-04 DIAGNOSIS — B955 Unspecified streptococcus as the cause of diseases classified elsewhere: Secondary | ICD-10-CM

## 2020-11-04 DIAGNOSIS — R6520 Severe sepsis without septic shock: Secondary | ICD-10-CM | POA: Diagnosis not present

## 2020-11-04 DIAGNOSIS — A419 Sepsis, unspecified organism: Secondary | ICD-10-CM | POA: Diagnosis not present

## 2020-11-04 DIAGNOSIS — R7881 Bacteremia: Secondary | ICD-10-CM | POA: Diagnosis not present

## 2020-11-04 DIAGNOSIS — I4821 Permanent atrial fibrillation: Secondary | ICD-10-CM | POA: Diagnosis not present

## 2020-11-04 LAB — GLUCOSE, CAPILLARY: Glucose-Capillary: 126 mg/dL — ABNORMAL HIGH (ref 70–99)

## 2020-11-04 LAB — URINE CULTURE: Culture: NO GROWTH

## 2020-11-04 LAB — URINALYSIS, ROUTINE W REFLEX MICROSCOPIC
Bilirubin Urine: NEGATIVE
Glucose, UA: NEGATIVE mg/dL
Ketones, ur: NEGATIVE mg/dL
Nitrite: NEGATIVE
Protein, ur: 30 mg/dL — AB
Specific Gravity, Urine: 1.013 (ref 1.005–1.030)
WBC, UA: >50 WBC/hpf — ABNORMAL HIGH (ref 0–5)
pH: 6 (ref 5.0–8.0)

## 2020-11-04 LAB — BASIC METABOLIC PANEL
Anion gap: 9 (ref 5–15)
BUN: 8 mg/dL (ref 8–23)
CO2: 18 mmol/L — ABNORMAL LOW (ref 22–32)
Calcium: 8.2 mg/dL — ABNORMAL LOW (ref 8.9–10.3)
Chloride: 108 mmol/L (ref 98–111)
Creatinine, Ser: 0.95 mg/dL (ref 0.44–1.00)
GFR, Estimated: >60 mL/min (ref >60)
Glucose, Bld: 112 mg/dL — ABNORMAL HIGH (ref 70–99)
Potassium: 3.7 mmol/L (ref 3.5–5.1)
Sodium: 135 mmol/L (ref 135–145)

## 2020-11-04 LAB — CBC
HCT: 31 % — ABNORMAL LOW (ref 36.0–46.0)
Hemoglobin: 10.1 g/dL — ABNORMAL LOW (ref 12.0–15.0)
MCH: 27.5 pg (ref 26.0–34.0)
MCHC: 32.6 g/dL (ref 30.0–36.0)
MCV: 84.5 fL (ref 80.0–100.0)
Platelets: 320 10*3/uL (ref 150–400)
RBC: 3.67 MIL/uL — ABNORMAL LOW (ref 3.87–5.11)
RDW: 14.7 % (ref 11.5–15.5)
WBC: 10.5 10*3/uL (ref 4.0–10.5)
nRBC: 0 % (ref 0.0–0.2)

## 2020-11-04 MED ORDER — IPRATROPIUM-ALBUTEROL 0.5-2.5 (3) MG/3ML IN SOLN
3.0000 mL | Freq: Four times a day (QID) | RESPIRATORY_TRACT | Status: DC
  Administered 2020-11-04 – 2020-11-05 (×2): 3 mL via RESPIRATORY_TRACT
  Filled 2020-11-04 (×2): qty 3

## 2020-11-04 MED ORDER — IBUPROFEN 600 MG PO TABS
600.0000 mg | ORAL_TABLET | Freq: Four times a day (QID) | ORAL | Status: AC | PRN
Start: 2020-11-04 — End: 2020-11-05
  Administered 2020-11-04 – 2020-11-05 (×2): 600 mg via ORAL
  Filled 2020-11-04 (×2): qty 1

## 2020-11-04 MED ORDER — DILTIAZEM HCL 60 MG PO TABS
30.0000 mg | ORAL_TABLET | Freq: Four times a day (QID) | ORAL | Status: DC
  Administered 2020-11-04 – 2020-11-06 (×7): 30 mg via ORAL
  Filled 2020-11-04 (×8): qty 1

## 2020-11-04 MED ORDER — DILTIAZEM HCL 25 MG/5ML IV SOLN
10.0000 mg | Freq: Once | INTRAVENOUS | Status: AC
  Administered 2020-11-04: 10 mg via INTRAVENOUS
  Filled 2020-11-04: qty 5

## 2020-11-04 MED ORDER — POTASSIUM CHLORIDE CRYS ER 20 MEQ PO TBCR
40.0000 meq | EXTENDED_RELEASE_TABLET | Freq: Once | ORAL | Status: AC
  Administered 2020-11-04: 40 meq via ORAL
  Filled 2020-11-04: qty 2

## 2020-11-04 NOTE — Progress Notes (Signed)
At start of shift, A&Ox4 with mild confusion. After midnight confusion increased. Calling out all night; unsure if slept much if any. When assessed was slow to answer, only following some simple commands, struggling or refusing to give orientation answers, saying things that were inappropriate to questions asked. CBG obtained - 124. Vitals collected - fever at 101.7. Provider notified. Order for in and out cath to collect UA/UC; collected and sent. PRN tylenol given. Finally sleeping at end of shift. Cycling BP per MEWS protocol for change to Red status.

## 2020-11-04 NOTE — Progress Notes (Signed)
PROGRESS NOTE    Andrea Yu  CBS:[id number] DOB: [birth year] DOA: 10/29/2020 PCP: Jarome Matin, MD   Brief Narrative: 80 year old with past medical history significant for paroxysmal A. fib, GERD, neuropathy, morbid obesity, psoriatic arthritis, iron deficiency anemia, history of subarachnoid hemorrhage, hypertension who presents to the ED complaining of the urinary frequency and decreased oral intake.  Patient report having UTI and was prescribed Bactrim by her PCP.  She completed course and despite still continues to have dysuria.  Patient was noted to be slightly confused. Evaluation in the ED patient was tachycardic, hypotensive, tachypneic, febrile.  Leukocytosis white blood cell 15.3.  UA moderate leukocyte.  CT abdomen show fluid collection inferior portion of the right buttock region inferior to the right ischial tuberosity.  Patient was admitted with sepsis, streptococcal parasanguinis bacteremia, ? UTI.  Plan to proceed with TEE tomorrow to rule out endocarditis  Assessment & Plan:   Principal Problem:   Severe sepsis (HCC) Active Problems:   Adrenal insufficiency (HCC)   PAF (paroxysmal atrial fibrillation) (HCC)   Psoriatic arthritis (HCC)   Iron deficiency anemia- transfused this admission   Depression   Peripheral neuropathy   Obesity (BMI 30-39.9)- suspected sleep apnea, declines sleep study   SAH (subarachnoid hemorrhage) (HCC)   Essential hypertension   GERD (gastroesophageal reflux disease)   1-Sepsis secondary to UTI, 1 blood culture positive for Streptococcus parasanguinis.  Severe sepsis ruled out .  Presented with fever tachycardia, tachypnea and hypotension. Received IV fluids. Continue with  IV ceftriaxone.  Spike intermittent fever.  1-4 Blood culture positive for Streptococcus species. .  WBC down to 11. Repeated Blood culture 10/07: no growth to date.  Plan for TEE tomorrow to rule out endocarditis.  2-question of right gluteal abscess by Ct  scan: chronic fluid collection.  Present  with fever, hypotension concern for sepsis.  General surgery and ID has been consulted.  She had a prior history of abscess.  Per ID and surgery no need for drainage. Less likely abscess.   3-Acute Metabolic Encephalopathy;  Patient was noticed to be not  very conversant, not following command, confused. She was febrile at that time.  Plan for MRI but patient decline MRI.  CT head no acute intracranial pathology.  Repeated CT scan after 24-48 hours symptoms negative for stroke.   A. fib with RVR: Cardiology consulted and following On oral Cardizem.  ECHO; normal EF>   Hypertension: Continue with Cardizem  Generalized weakness: PT OT  Depression Continue with Cymbalta. HypoKalemia: Replaced. Hyponatremia; received fluids.  Peripheral neuropathy: Continue with gabapentin   Pressure Injury 04/29/17 Stage II -  Partial thickness loss of dermis presenting as a shallow open ulcer with a red, pink wound bed without slough. healing callus, open pink wound bed (Active)  04/29/17 0800  Location: Heel  Location Orientation: Right  Staging: Stage II -  Partial thickness loss of dermis presenting as a shallow open ulcer with a red, pink wound bed without slough.  Wound Description (Comments): healing callus, open pink wound bed  Present on Admission:      Pressure Injury 10/30/20 Buttocks Right healed (Active)  10/30/20 2000  Location: Buttocks  Location Orientation: Right  Staging:   Wound Description (Comments): healed  Present on Admission: Yes        Estimated body mass index is 38.69 kg/m as calculated from the following:   Height as of this encounter: 4\' 11"  (1.499 m).   Weight as of this encounter: 86.9 kg.  DVT prophylaxis: Lovenox Code Status: Full code Family Communication: Daughter over phone 10/10 Disposition Plan:  Status is: Inpatient  Remains inpatient appropriate because:IV treatments appropriate due to intensity  of illness or inability to take PO  Dispo: The patient is from: Home              Anticipated d/c is to: Home              Patient currently is not medically stable to d/c.   Difficult to place patient No        Consultants:  ID General surgery  Procedures:    Antimicrobials:  Aztreonam  Subjective: She is alert, her speech is clear, she denies pain.  She agreed to have TEE tomorrow.  Objective: Vitals:   11/04/20 0443 11/04/20 0446 11/04/20 0642 11/04/20 0721  BP:  (!) 124/56 (!) 107/53 (!) 101/55  Pulse:  (!) 112 87 76  Resp:  (!) 31 (!) 26 20  Temp: (!) 101.7 F (38.7 C) (!) 101.7 F (38.7 C)  98.5 F (36.9 C)  TempSrc: Oral Oral  Oral  SpO2:  96% 96% 96%  Weight:      Height:        Intake/Output Summary (Last 24 hours) at 11/04/2020 0840 Last data filed at 11/04/2020 0600 Gross per 24 hour  Intake 1000 ml  Output 1 ml  Net 999 ml    Filed Weights   10/30/20 0700 11/04/20 0327  Weight: 83 kg 86.9 kg    Examination:  General exam: NAD Respiratory system: CTA Cardiovascular system: S 1, S 2 RRR Gastrointestinal system: BS present, soft, nt Central nervous system: Alert, follows command. Speech clear.  Extremities:No edema    Data Reviewed: I have personally reviewed following labs and imaging studies  CBC: Recent Labs  Lab 10/29/20 1428 10/29/20 1629 10/31/20 0159 11/01/20 0753 11/02/20 0746 11/03/20 0907 11/04/20 0331  WBC 15.3*   < > 16.0* 14.7* 13.2* 11.1* 10.5  NEUTROABS 12.1*  --   --   --   --   --   --   HGB 12.0   < > 9.4* 9.7* 10.1* 9.8* 10.1*  HCT 38.1   < > 29.6* 30.0* 32.0* 30.4* 31.0*  MCV 89.6   < > 88.6 86.5 88.4 85.2 84.5  PLT 197   < > 184 217 231 293 320   < > = values in this interval not displayed.    Basic Metabolic Panel: Recent Labs  Lab 10/31/20 0159 11/01/20 0753 11/02/20 0746 11/03/20 0907 11/04/20 0331  NA 133* 131* 130* 133* 135  K 3.4* 3.5 3.5 2.9* 3.7  CL 103 101 101 103 108  CO2 18* 18*  18* 19* 18*  GLUCOSE 83 78 77 90 112*  BUN 10 9 7* 9 8  CREATININE 0.91 0.96 1.09* 1.02* 0.95  CALCIUM 8.1* 8.0* 8.1* 8.1* 8.2*  MG  --   --   --  1.7  --     GFR: Estimated Creatinine Clearance: 44.5 mL/min (by C-G formula based on SCr of 0.95 mg/dL). Liver Function Tests: Recent Labs  Lab 10/29/20 1428 10/30/20 0558  AST 22 21  ALT 19 18  ALKPHOS 76 61  BILITOT 0.9 1.0  PROT 6.7 5.2*  ALBUMIN 2.9* 2.4*    No results for input(s): LIPASE, AMYLASE in the last 168 hours. No results for input(s): AMMONIA in the last 168 hours. Coagulation Profile: Recent Labs  Lab 10/29/20 1428 10/30/20 0558  INR 1.2  1.2    Cardiac Enzymes: No results for input(s): CKTOTAL, CKMB, CKMBINDEX, TROPONINI in the last 168 hours. BNP (last 3 results) No results for input(s): PROBNP in the last 8760 hours. HbA1C: No results for input(s): HGBA1C in the last 72 hours. CBG: Recent Labs  Lab 11/04/20 0517  GLUCAP 126*   Lipid Profile: No results for input(s): CHOL, HDL, LDLCALC, TRIG, CHOLHDL, LDLDIRECT in the last 72 hours. Thyroid Function Tests: Recent Labs    11/03/20 0907  TSH 3.168    Anemia Panel: No results for input(s): VITAMINB12, FOLATE, FERRITIN, TIBC, IRON, RETICCTPCT in the last 72 hours. Sepsis Labs: Recent Labs  Lab 10/29/20 1428 10/29/20 1710 10/30/20 0558  PROCALCITON  --   --  1.20  LATICACIDVEN 1.0 1.8  --      Recent Results (from the past 240 hour(s))  Blood Culture (routine x 2)     Status: Abnormal (Preliminary result)   Collection Time: 10/29/20 10:45 AM   Specimen: BLOOD  Result Value Ref Range Status   Specimen Description BLOOD BLOOD RIGHT FOREARM  Final   Special Requests   Final    BOTTLES DRAWN AEROBIC AND ANAEROBIC Blood Culture adequate volume   Culture  Setup Time   Final    GRAM POSITIVE COCCI AEROBIC BOTTLE ONLY CRITICAL RESULT CALLED TO, READ BACK BY AND VERIFIED WITH: HURTH PHARMD @0920  10/31/20 EB    Culture (A)  Final     STREPTOCOCCUS PARASANGUINIS CULTURE REINCUBATED FOR BETTER GROWTH Performed at [facility], [address], [place], [place]    Report Status PENDING  Incomplete  Blood Culture ID Panel (Reflexed)     Status: Abnormal   Collection Time: 10/29/20 10:45 AM  Result Value Ref Range Status   Enterococcus faecalis NOT DETECTED NOT DETECTED Final   Enterococcus Faecium NOT DETECTED NOT DETECTED Final   Listeria monocytogenes NOT DETECTED NOT DETECTED Final   Staphylococcus species NOT DETECTED NOT DETECTED Final   Staphylococcus aureus (BCID) NOT DETECTED NOT DETECTED Final   Staphylococcus epidermidis NOT DETECTED NOT DETECTED Final   Staphylococcus lugdunensis NOT DETECTED NOT DETECTED Final   Streptococcus species DETECTED (A) NOT DETECTED Final    Comment: Not Enterococcus species, Streptococcus agalactiae, Streptococcus pyogenes, or Streptococcus pneumoniae. CRITICAL RESULT CALLED TO, READ BACK BY AND VERIFIED WITH: HURTH PHARMD @0920  10/31/20 EB    Streptococcus agalactiae NOT DETECTED NOT DETECTED Final   Streptococcus pneumoniae NOT DETECTED NOT DETECTED Final   Streptococcus pyogenes NOT DETECTED NOT DETECTED Final   A.calcoaceticus-baumannii NOT DETECTED NOT DETECTED Final   Bacteroides fragilis NOT DETECTED NOT DETECTED Final   Enterobacterales NOT DETECTED NOT DETECTED Final   Enterobacter cloacae complex NOT DETECTED NOT DETECTED Final   Escherichia coli NOT DETECTED NOT DETECTED Final   Klebsiella aerogenes NOT DETECTED NOT DETECTED Final   Klebsiella oxytoca NOT DETECTED NOT DETECTED Final   Klebsiella pneumoniae NOT DETECTED NOT DETECTED Final   Proteus species NOT DETECTED NOT DETECTED Final   Salmonella species NOT DETECTED NOT DETECTED Final   Serratia marcescens NOT DETECTED NOT DETECTED Final   Haemophilus influenzae NOT DETECTED NOT DETECTED Final   Neisseria meningitidis NOT DETECTED NOT DETECTED Final   Pseudomonas aeruginosa NOT DETECTED NOT  DETECTED Final   Stenotrophomonas maltophilia NOT DETECTED NOT DETECTED Final   Candida albicans NOT DETECTED NOT DETECTED Final   Candida auris NOT DETECTED NOT DETECTED Final   Candida glabrata NOT DETECTED NOT DETECTED Final   Candida krusei NOT DETECTED  NOT DETECTED Final   Candida parapsilosis NOT DETECTED NOT DETECTED Final   Candida tropicalis NOT DETECTED NOT DETECTED Final   Cryptococcus neoformans/gattii NOT DETECTED NOT DETECTED Final    Comment: Performed at [facility], [address], [place], [place] [address]  Resp Panel by RT-PCR (Flu A&B, Covid)     Status: None   Collection Time: 10/29/20  2:22 PM   Specimen: Nasopharyngeal(NP) swabs in vial transport medium  Result Value Ref Range Status   SARS Coronavirus 2 by RT PCR NEGATIVE NEGATIVE Final    Comment: (NOTE) SARS-CoV-2 target nucleic acids are NOT DETECTED.  The SARS-CoV-2 RNA is generally detectable in upper respiratory specimens during the acute phase of infection. The lowest concentration of SARS-CoV-2 viral copies this assay can detect is 138 copies/mL. A negative result does not preclude SARS-Cov-2 infection and should not be used as the sole basis for treatment or other patient management decisions. A negative result may occur with  improper specimen collection/handling, submission of specimen other than nasopharyngeal swab, presence of viral mutation(s) within the areas targeted by this assay, and inadequate number of viral copies(<138 copies/mL). A negative result must be combined with clinical observations, patient history, and epidemiological information. The expected result is Negative.  Fact Sheet for Patients:  BloggerCourse.com  Fact Sheet for Healthcare Providers:  SeriousBroker.it  This test is no t yet approved or cleared by the [place] FDA and  has been authorized for detection and/or diagnosis of SARS-CoV-2 by FDA under an  Emergency Use Authorization (EUA). This EUA will remain  in effect (meaning this test can be used) for the duration of the COVID-19 declaration under Section 564(b)(1) of the Act, 21 U.S.C.section 360bbb-3(b)(1), unless the authorization is terminated  or revoked sooner.       Influenza A by PCR NEGATIVE NEGATIVE Final   Influenza B by PCR NEGATIVE NEGATIVE Final    Comment: (NOTE) The Xpert Xpress SARS-CoV-2/FLU/RSV plus assay is intended as an aid in the diagnosis of influenza from Nasopharyngeal swab specimens and should not be used as a sole basis for treatment. Nasal washings and aspirates are unacceptable for Xpert Xpress SARS-CoV-2/FLU/RSV testing.  Fact Sheet for Patients: BloggerCourse.com  Fact Sheet for Healthcare Providers: SeriousBroker.it  This test is not yet approved or cleared by the [place] FDA and has been authorized for detection and/or diagnosis of SARS-CoV-2 by FDA under an Emergency Use Authorization (EUA). This EUA will remain in effect (meaning this test can be used) for the duration of the COVID-19 declaration under Section 564(b)(1) of the Act, 21 U.S.C. section 360bbb-3(b)(1), unless the authorization is terminated or revoked.  Performed at [facility], [address], [place], [place] [address]   Blood Culture (routine x 2)     Status: None   Collection Time: 10/29/20  2:28 PM   Specimen: BLOOD  Result Value Ref Range Status   Specimen Description BLOOD SITE NOT SPECIFIED  Final   Special Requests   Final    BOTTLES DRAWN AEROBIC AND ANAEROBIC Blood Culture adequate volume   Culture   Final    NO GROWTH 5 DAYS Performed at [facility], [address] [place], [place] [address]    Report Status 11/03/2020 FINAL  Final  Urine Culture     Status: Abnormal   Collection Time: 10/29/20  3:45 PM   Specimen: In/Out Cath Urine  Result Value Ref Range Status   Specimen  Description IN/OUT CATH URINE  Final   Special Requests   Final    NONE Performed at [facility], [address] [place], [place] [address]    Culture MULTIPLE SPECIES PRESENT, SUGGEST RECOLLECTION (A)  Final   Report Status 10/30/2020 FINAL  Final  Culture, blood (routine x 2)     Status: None (Preliminary result)   Collection Time: 10/31/20  2:30 PM   Specimen: BLOOD  Result Value Ref Range Status   Specimen Description BLOOD RIGHT ANTECUBITAL  Final   Special Requests   Final    BOTTLES DRAWN AEROBIC ONLY Blood Culture results may not be optimal due to an inadequate volume of blood received in culture bottles   Culture   Final    NO GROWTH 4 DAYS Performed at [facility], [address] [place], [place] [address]    Report Status PENDING  Incomplete  Culture, blood (routine x 2)     Status: None (Preliminary result)   Collection Time: 10/31/20  2:35 PM   Specimen: BLOOD RIGHT HAND  Result Value Ref Range Status   Specimen Description BLOOD RIGHT HAND  Final   Special Requests   Final    BOTTLES DRAWN AEROBIC ONLY Blood Culture results may not be optimal due to an inadequate volume of blood received in culture bottles   Culture   Final    NO GROWTH 4 DAYS Performed at [facility], [address], [place], [place] [address]    Report Status PENDING  Incomplete          Radiology Studies: DG Chest 1 View  Result Date: 11/02/2020 CLINICAL DATA:  Fever, confusion EXAM: CHEST  1 VIEW COMPARISON:  10/29/2020 FINDINGS: Single frontal view of the chest was obtained with the patient rotated to the left. The cardiac silhouette is unremarkable. No acute airspace disease, effusion, or pneumothorax. No acute bony abnormalities. IMPRESSION: 1. No acute intrathoracic process. Electronically Signed   By: Sharlet Salina M.D.   On: 11/02/2020 18:50   CT HEAD WO CONTRAST ( )  Result Date: 11/03/2020 CLINICAL DATA:  Delirium EXAM: CT HEAD WITHOUT CONTRAST  TECHNIQUE: Contiguous axial images were obtained from the base of the skull through the vertex without intravenous contrast. COMPARISON:  None. FINDINGS: Brain: There is no mass, hemorrhage or extra-axial collection. The size and configuration of the ventricles and extra-axial CSF spaces are normal. There is hypoattenuation of the white matter, most commonly indicating chronic small vessel disease. Vascular: No abnormal hyperdensity of the major intracranial arteries or dural venous sinuses. No intracranial atherosclerosis. Skull: The visualized skull base, calvarium and extracranial soft tissues are normal. Sinuses/Orbits: No fluid levels or advanced mucosal thickening of the visualized paranasal sinuses. No mastoid or middle ear effusion. The orbits are normal. IMPRESSION: Chronic small vessel disease without acute intracranial abnormality. Electronically Signed   By: Deatra Robinson M.D.   On: 11/03/2020 00:36        Scheduled Meds:  aspirin EC  81 mg Oral QPM   docusate sodium  100 mg Oral BID   DULoxetine  90 mg Oral Daily   enoxaparin (LOVENOX) injection  40 mg Subcutaneous Q24H   metoprolol succinate  50 mg Oral Daily   nystatin  5 mL Oral QID   pantoprazole  40 mg Oral Daily   Continuous Infusions:  cefTRIAXone (ROCEPHIN)  IV 2 g (11/03/20 1158)     LOS: 6 days    Time spent: 35 minutes.     Dakota Stangl A  Tzivia Oneil, MD [facility]   If 7PM-7AM, please contact night-coverage [email or web address]  11/04/2020, 8:40 AM

## 2020-11-04 NOTE — Social Work (Signed)
4:57 pm-CSW Mihika Surrette-called patient's daughter left message w/ gentleman answering [phone], to contact CSW. He confirmed patient's daughter received message yesterday, from CSW [place]. This Clinical research associate, urged for patient's daughter return call.  5:21pm- CSW visit patient at bedside. CSW introduced self and explained role. CSW discussed therapy recommendation of short term rehab at [facility]. Patient declined SNF. She reports her daughter Clydie Braun lives in the home but she works during the day, but she has a Designer, industrial/product in the home from 10-4pm. Her grandson in there when he gets off work 3pm (he lives in the home). Patient states no questions or concerns at this time.     Antony Blackbird, MSW, LCSW Clinical Social Worker

## 2020-11-04 NOTE — TOC Initial Note (Signed)
Transition of Care [facility]) - Initial/Assessment Note    Patient Details  Name: Andrea Yu MRN: [id number] Date of Birth: December 24, 1940  Transition of Care [facility]) CM/SW Contact:    Eduard Roux, LCSW Phone Number: 11/04/2020, 5:28 PM  Clinical Narrative:                  4:57 pm-CSW Maclean Foister-called patient's daughter left message w/ gentleman answering [phone], to contact CSW. He confirmed patient's daughter received message yesterday, from CSW [place]. This Clinical research associate, urged for patient's daughter return call.  5:21pm- CSW visit patient at bedside. CSW introduced self and explained role. CSW discussed therapy recommendation of short term rehab at [facility]. Patient declined SNF. She reports her daughter karen lives in the home but she works during the day, but she has a Designer, industrial/product in the home from 10-4pm. Her grandson in there when he gets off work 3pm (he lives in the home). Patient shared she was blessed. CSW informed called her daughter but has not been able to speak with her. Patient states no questions or concerns at this time.   Antony Blackbird, MSW, LCSW Clinical Social Worker    Expected Discharge Plan: Home w Home Health Services Barriers to Discharge: Continued Medical Work up   Patient Goals and CMS Choice        Expected Discharge Plan and Services Expected Discharge Plan: Home w Home Health Services In-house Referral: Clinical Social Work                                            Prior Living Arrangements/Services   Lives with:: Self, Adult Children Patient language and need for interpreter reviewed:: No        Need for Family Participation in Patient Care: Yes (Comment) Care giver support system in place?: Yes (comment)   Criminal Activity/Legal Involvement Pertinent to Current Situation/Hospitalization: No - Comment as needed  Activities of Daily Living      Permission Sought/Granted Permission sought to share information with : Family  Supports Permission granted to share information with : Yes, Verbal Permission Granted  Share Information with NAME: Stpehanie Montroy     Permission granted to share info w Relationship: daughter  Permission granted to share info w Contact Information: [phone]  Emotional Assessment Appearance:: Appears stated age Attitude/Demeanor/Rapport: Engaged, Self-Confident Affect (typically observed): Accepting, Appropriate, Pleasant Orientation: : Oriented to Self, Oriented to Place, Oriented to  Time, Oriented to Situation Alcohol / Substance Use: Not Applicable Psych Involvement: No (comment)  Admission diagnosis:  Dehydration [E86.0] Atrial fibrillation with RVR (HCC) [I48.91] Severe sepsis (HCC) [A41.9, R65.20] Urinary tract infection without hematuria, site unspecified [N39.0] Sepsis without acute organ dysfunction, due to unspecified organism [facility]) [A41.9] Patient Active Problem List   Diagnosis Date Noted   Severe sepsis (HCC) 10/29/2020   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/15/2020   Fatigue 08/15/2020   Polyarthralgia 08/15/2020   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   Permanent atrial fibrillation (HCC) 05/17/2019   Dyslipidemia (high LDL; low HDL) 05/17/2019   S/P PICC central line placement 01/24/2018   Diarrhea 12/14/2017   Left buttock abscess 12/06/2017   Discitis of thoracic region 05/12/2017   Medication monitoring encounter 05/12/2017   Hx of adverse drug reaction    History of blood transfusion    GERD (gastroesophageal reflux disease)    Dysrhythmia    Complication of anesthesia  Arthritis    Anemia    Displaced spiral fracture of shaft of right tibia, subsequent encounter for closed fracture with delayed healing 11/11/2016   Status post ankle arthrodesis 01/07/2016   Osteomyelitis of pelvis (HCC)    SAH (subarachnoid hemorrhage) (HCC) 09/14/2014   Essential hypertension 09/14/2014   Obesity (BMI 30-39.9)- suspected sleep apnea, declines sleep study  11/10/2012   Psoriatic arthritis (HCC) 11/06/2012   Iron deficiency anemia- transfused this admission 11/06/2012   Depression 11/06/2012   Peripheral neuropathy 11/06/2012   Adrenal insufficiency (HCC) 08/09/2012   PAF (paroxysmal atrial fibrillation) (HCC) 08/09/2012   PCP:  Jarome Matin, MD Pharmacy:   [facility]  [facility]) - [place], [place] - [address] [facility] [address] [place] [address] [place] [address] Phone: [phone] Fax: [phone]  [id number] - [place], [place] - [address] AT [facility] OF [address] [place] [address] Phone: [phone] Fax: [phone]     Social Determinants of Health (SDOH) Interventions    Readmission Risk Interventions No flowsheet data found.

## 2020-11-04 NOTE — Progress Notes (Signed)
Progress Note  Patient Name: Andrea Yu Date of Encounter: 11/04/2020  [facility] Cardiologist: Thurmon Fair, MD   Subjective   Remains very sleepy, hard to wake.  However, when she finally wakes up she recognizes me and mumbles that she is at [facility]. Even more confused overnight.  Slept very little. Febrile to 101.7.   Inpatient Medications    Scheduled Meds:  aspirin EC  81 mg Oral QPM   docusate sodium  100 mg Oral BID   DULoxetine  90 mg Oral Daily   enoxaparin (LOVENOX) injection  40 mg Subcutaneous Q24H   metoprolol succinate  50 mg Oral Daily   nystatin  5 mL Oral QID   pantoprazole  40 mg Oral Daily   Continuous Infusions:  cefTRIAXone (ROCEPHIN)  IV 2 g (11/03/20 1158)   PRN Meds: acetaminophen **OR** acetaminophen, HYDROcodone-acetaminophen, ibuprofen, ondansetron **OR** ondansetron (ZOFRAN) IV   Vital Signs    Vitals:   11/04/20 0443 11/04/20 0446 11/04/20 0642 11/04/20 0721  BP:  (!) 124/56 (!) 107/53 (!) 101/55  Pulse:  (!) 112 87 76  Resp:  (!) 31 (!) 26 20  Temp: (!) 101.7 F (38.7 C) (!) 101.7 F (38.7 C)  98.5 F (36.9 C)  TempSrc: Oral Oral  Oral  SpO2:  96% 96% 96%  Weight:      Height:        Intake/Output Summary (Last 24 hours) at 11/04/2020 0903 Last data filed at 11/04/2020 0600 Gross per 24 hour  Intake 1000 ml  Output 1 ml  Net 999 ml   Last 3 Weights 11/04/2020 10/30/2020 12/13/2019  Weight (lbs) 191 lb 9.3 oz 182 lb 15.7 oz 183 lb  Weight (kg) 86.9 kg 83 kg 83.008 kg      Telemetry    Atrial fibrillation with controlled ventricular rate- Personally Reviewed  ECG    No new tracing- Personally Reviewed  Physical Exam  Stuporous GEN: No acute distress.   Neck: No JVD Cardiac: Irregular, faint systolic murmur at the right upper sternal border no diastolic murmurs, rubs, or gallops.  Respiratory: Clear to auscultation bilaterally. GI: Soft, nontender, non-distended  MS: No edema; No deformity. Neuro:   Nonfocal  Psych: Normal affect   Labs    High Sensitivity Troponin:  No results for input(s): TROPONINIHS in the last 720 hours.   Chemistry Recent Labs  Lab 10/29/20 1428 10/29/20 1629 10/30/20 0558 10/31/20 0159 11/02/20 0746 11/03/20 0907 11/04/20 0331  NA 133*   < > 133*   < > 130* 133* 135  K 5.1   < > 3.2*   < > 3.5 2.9* 3.7  CL 103   < > 100   < > 101 103 108  CO2 18*  --  20*   < > 18* 19* 18*  GLUCOSE 105*   < > 82   < > 77 90 112*  BUN 24*   < > 14   < > 7* 9 8  CREATININE 1.22*   < > 1.06*   < > 1.09* 1.02* 0.95  CALCIUM 9.0  --  8.3*   < > 8.1* 8.1* 8.2*  MG  --   --   --   --   --  1.7  --   PROT 6.7  --  5.2*  --   --   --   --   ALBUMIN 2.9*  --  2.4*  --   --   --   --  AST 22  --  21  --   --   --   --   ALT 19  --  18  --   --   --   --   ALKPHOS 76  --  61  --   --   --   --   BILITOT 0.9  --  1.0  --   --   --   --   GFRNONAA 45*   < > 53*   < > 51* 55* >60  ANIONGAP 12  --  13   < > 11 11 9    < > = values in this interval not displayed.    Lipids No results for input(s): CHOL, TRIG, HDL, LABVLDL, LDLCALC, CHOLHDL in the last 168 hours.  Hematology Recent Labs  Lab 11/02/20 0746 11/03/20 0907 11/04/20 0331  WBC 13.2* 11.1* 10.5  RBC 3.62* 3.57* 3.67*  HGB 10.1* 9.8* 10.1*  HCT 32.0* 30.4* 31.0*  MCV 88.4 85.2 84.5  MCH 27.9 27.5 27.5  MCHC 31.6 32.2 32.6  RDW 15.0 15.0 14.7  PLT 231 293 320   Thyroid  Recent Labs  Lab 11/03/20 0907  TSH 3.168    BNPNo results for input(s): BNP, PROBNP in the last 168 hours.  DDimer No results for input(s): DDIMER in the last 168 hours.   Radiology    DG Chest 1 View  Result Date: 11/02/2020 CLINICAL DATA:  Fever, confusion EXAM: CHEST  1 VIEW COMPARISON:  10/29/2020 FINDINGS: Single frontal view of the chest was obtained with the patient rotated to the left. The cardiac silhouette is unremarkable. No acute airspace disease, effusion, or pneumothorax. No acute bony abnormalities. IMPRESSION: 1. No  acute intrathoracic process. Electronically Signed   By: 12/29/2020 M.D.   On: 11/02/2020 18:50   CT HEAD WO CONTRAST (01/02/2021)  Result Date: 11/03/2020 CLINICAL DATA:  Delirium EXAM: CT HEAD WITHOUT CONTRAST TECHNIQUE: Contiguous axial images were obtained from the base of the skull through the vertex without intravenous contrast. COMPARISON:  None. FINDINGS: Brain: There is no mass, hemorrhage or extra-axial collection. The size and configuration of the ventricles and extra-axial CSF spaces are normal. There is hypoattenuation of the white matter, most commonly indicating chronic small vessel disease. Vascular: No abnormal hyperdensity of the major intracranial arteries or dural venous sinuses. No intracranial atherosclerosis. Skull: The visualized skull base, calvarium and extracranial soft tissues are normal. Sinuses/Orbits: No fluid levels or advanced mucosal thickening of the visualized paranasal sinuses. No mastoid or middle ear effusion. The orbits are normal. IMPRESSION: Chronic small vessel disease without acute intracranial abnormality. Electronically Signed   By: 01/03/2021 M.D.   On: 11/03/2020 00:36    Cardiac Studies   - Left ventricle: The cavity size was normal. Wall thickness was    normal. Systolic function was normal. The estimated ejection    fraction was in the range of 50% to 55%. Wall motion was normal;    there were no regional wall motion abnormalities.  - Aortic valve: There was mild regurgitation.  - Left atrium: The atrium was moderately dilated.  - Pulmonary arteries: Systolic pressure was mildly increased. PA    peak pressure: 40 mm Hg (S).   Impressions:   - Normal LV systolic function; mild AI; moderate LAE; mild TR; mild    pulmonary hypertension.   Patient Profile     80 y.o. female with atrial fibrillation, essential hypertension, psoriatic arthritis, history of neuropathy with orthostatic hypotension and  frequent falls presenting with acute febrile  illness, encephalopathy initially felt to be urosepsis, but subsequently demonstrated to have Streptococcus parasanguinis bacteremia.  Assessment & Plan    1.  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  Antibiotic susceptibilities pending, but almost certainly sensitive to penicillin and cephalosporins.  Clinically, very slow response to antibiotic therapy so far.  So far no evidence of major structural valvular damage by transthoracic echo.  We will need semipermanent IV access for prolonged IV antibiotics. This procedure has been fully reviewed with the patient's daughter and informed consent has been obtained. 2.  Permanent atrial fibrillation: Not on anticoagulation due to frequent falls and injuries, including intracranial hemorrhage.  The rate is well controlled. 3.  Encephalopathy: Early due to acute infectious illness.  CT scans on 10/07 and 10/10 have not shown acute abnormalities, did disclose evidence of chronic small vessel disease and advanced atherotic calcification within the carotid siphons.     For questions or updates, please contact [facility] Please consult [email or web address] for contact info under        Signed, Thurmon Fair, MD  11/04/2020, 9:03 AM

## 2020-11-04 NOTE — Progress Notes (Signed)
Physical Therapy Treatment Patient Details Name: Andrea Yu MRN: [id number] DOB: [birth year] Today's Date: 11/04/2020   History of Present Illness Pt is an 80 y.o. female admitted 10/29/20 with confusion, decreased oral intake, urinary frequency; of note, pt with recent UTI. Workup for sepsis secondary to UTI. PMH includes PAF, neuropathy, anemia, SAH, HTN, obesity.    PT Comments    Pt much more alert and awake. Pt able to clarify that she uses scooter at home and is typically able to stand pivot to the scooter on her own. Pt still requiring significant assist with mobility here and currently unable to perform stand pivot with 1 person. Continue to recommend SNF prior to return home.    Recommendations for follow up therapy are one component of a multi-disciplinary discharge planning process, led by the attending physician.  Recommendations may be updated based on patient status, additional functional criteria and insurance authorization.  Follow Up Recommendations  SNF     Equipment Recommendations  None recommended by PT    Recommendations for Other Services       Precautions / Restrictions Precautions Precautions: Fall     Mobility  Bed Mobility Overal bed mobility: Needs Assistance Bed Mobility: Rolling;Supine to Sit;Sit to Supine Rolling: Min guard;Min assist (min guard to the lt and min assist toward the rt.)   Supine to sit: Mod assist;HOB elevated Sit to supine: Mod assist;HOB elevated   General bed mobility comments: Assist to bring legs off of bed, elevate trunk into sitting and bring hips to EOB. Assist to bring legs back up into bed returning to supine.    Transfers Overall transfer level: Needs assistance Equipment used: 1 person hand held assist Transfers: Sit to/from Stand Sit to Stand: Mod assist         General transfer comment: Assist to bring hips up and for balance. Used bil shelf arm support. Only stood 2-3 seconds and with posterior LE's  braced on bed  Ambulation/Gait             General Gait Details: Unable. Pt non ambulatory at baseline   Stairs             Wheelchair Mobility    Modified Rankin (Stroke Patients Only)       Balance Overall balance assessment: Needs assistance Sitting-balance support: No upper extremity supported;Feet supported Sitting balance-Leahy Scale: Fair     Standing balance support: Bilateral upper extremity supported Standing balance-Leahy Scale: Poor Standing balance comment: Stood 2-3 seconds with mod assist using shelf arm support and posterior LE's braced on bed                            Cognition Arousal/Alertness: Awake/alert Behavior During Therapy: WFL for tasks assessed/performed Overall Cognitive Status: No family/caregiver present to determine baseline cognitive functioning                                 General Comments: Pt following commands and A & O x4. Pt was confused overnight      Exercises      General Comments        Pertinent Vitals/Pain Pain Assessment: Faces Faces Pain Scale: Hurts a little bit Pain Location: BUE with mobility Pain Descriptors / Indicators: Grimacing;Guarding Pain Intervention(s): Limited activity within patient's tolerance;Repositioned    Home Living  Prior Function            PT Goals (current goals can now be found in the care plan section) Progress towards PT goals: Progressing toward goals    Frequency    Min 2X/week      PT Plan Current plan remains appropriate    Co-evaluation              AM-PAC PT "6 Clicks" Mobility   Outcome Measure  Help needed turning from your back to your side while in a flat bed without using bedrails?: A Little Help needed moving from lying on your back to sitting on the side of a flat bed without using bedrails?: A Lot Help needed moving to and from a bed to a chair (including a wheelchair)?:  Total Help needed standing up from a chair using your arms (e.g., wheelchair or bedside chair)?: Total Help needed to walk in hospital room?: Total Help needed climbing 3-5 steps with a railing? : Total 6 Click Score: 9    End of Session   Activity Tolerance: Patient tolerated treatment well Patient left: in bed;with bed alarm set;with call bell/phone within reach Nurse Communication: Mobility status PT Visit Diagnosis: Other abnormalities of gait and mobility (R26.89);Muscle weakness (generalized) (M62.81)     Time: 1601-0932 PT Time Calculation (min) (ACUTE ONLY): 29 min  Charges:  $Therapeutic Activity: 23-37 mins                     [facility] PT Acute Rehabilitation Services Pager [phone] Office [phone]    [name] [facility] 11/04/2020, 1:29 PM

## 2020-11-04 NOTE — Progress Notes (Signed)
[facility] Infectious Diseases Follow Up Note  Patient Identification: Patient Name: Andrea Yu MRN: [id number] Admit Date: 10/29/2020  1:41 PM Age: 80 y.o.Today's Date: 11/04/2020   Reason for Visit: Streptococcus bacteremia  Principal Problem:   Severe sepsis [facility]) Active Problems:   Adrenal insufficiency (HCC)   PAF (paroxysmal atrial fibrillation) (HCC)   Psoriatic arthritis (HCC)   Iron deficiency anemia- transfused this admission   Depression   Peripheral neuropathy   Obesity (BMI 30-39.9)- suspected sleep apnea, declines sleep study   SAH (subarachnoid hemorrhage) (HCC)   Essential hypertension   GERD (gastroesophageal reflux disease)   Antibiotics: Aztreonam 10/5-10/6                    Metronidazole 10/5                    Vancomycin 10/5                    Ceftriaxone 10/7-current  Lines/Hardware:   Interval Events: 1 episode of fever yesterday 101.7, no leukocytosis.  Increased confusion overnight and slept less.    Assessment:  Streptococcal Parasanguinis 1/4 bottles  Intermittent fevers ; ? UTI given UA, has suprapubic tenderness. R/o endocarditis 3.   Encephalopathy ? Improving: She had increased confusion overnight with less sleep.  She appeared drowsy but able to be aroused with verbal stimuli, follow commands.  She is able to tell me she is at home hospital, month and year.  She denies any pain.  4. Fluid collection in the RT buttock: seems to be chronic without any signs of acute inflammation on exam, seen by surgery and less likely to be an abscess causing fevers   Comments: Unclear source of fever. Also unclear if strep isolated in 1/4 bottles is a contaminant vs a true pathogen ( given fevers and altered mental status which seems to be somewhat improving based on documentation on on prior notes). TTE is negative for endocarditis. TEE is planned for 10/14  Recommendations Continue ceftriaxone as  is, have requested for sensi on Strep parasanguinis. This will also cover for UTI.  Follow up urine cx US renal ordered  Fu TEE Monitor CBC and CMP  Rest of the management as per the primary team. Thank you for the consult. Please page with pertinent questions or concerns.  ______________________________________________________________________ Subjective patient seen and examined at the bedside. She is lying in the bed, She is able to tell me her name, name of hospital, month and years. She is able to follow commands. She appears drowsy.    Vitals BP (!) 101/55 (BP Location: Left Arm)   Pulse 76   Temp 98.5 F (36.9 C) (Oral)   Resp 20   Ht 4\' 11"  (1.499 m)   Wt 86.9 kg   SpO2 96%   BMI 38.69 kg/m     Physical Exam Constitutional:  Not in acute distress, lying in the bed.     Comments:   Cardiovascular:     Rate and Rhythm: Normal rate and regular rhythm.     Heart sounds:   Pulmonary:     Effort: Pulmonary effort is normal.     Comments:   Abdominal:     Palpations: Abdomen is soft.     Tenderness: Non tender and non distended   Musculoskeletal:        General: No swelling or tenderness. Rt buttock - no erythema/tenderness or warmth and no fluctuance. No peripheral joint swelling  and tenderness  Skin:    Comments: No obvious lesions or rashes. Have asked RN to assess her back when able as difficult to position her being drowsy  Neurological:     General: able to move all extremities spontaneously  Psychiatric:        Mood and Affect: Mood normal.     Pertinent Microbiology Results for orders placed or performed during the hospital encounter of 10/29/20  Blood Culture (routine x 2)     Status: Abnormal (Preliminary result)   Collection Time: 10/29/20 10:45 AM   Specimen: BLOOD  Result Value Ref Range Status   Specimen Description BLOOD BLOOD RIGHT FOREARM  Final   Special Requests   Final    BOTTLES DRAWN AEROBIC AND ANAEROBIC Blood Culture adequate  volume   Culture  Setup Time   Final    GRAM POSITIVE COCCI AEROBIC BOTTLE ONLY CRITICAL RESULT CALLED TO, READ BACK BY AND VERIFIED WITH: HURTH PHARMD @0920  10/31/20 EB    Culture (A)  Final    STREPTOCOCCUS PARASANGUINIS SUSCEPTIBILITIES TO FOLLOW Performed at [facility], [address] [place], [place] [address]    Report Status PENDING  Incomplete  Blood Culture ID Panel (Reflexed)     Status: Abnormal   Collection Time: 10/29/20 10:45 AM  Result Value Ref Range Status   Enterococcus faecalis NOT DETECTED NOT DETECTED Final   Enterococcus Faecium NOT DETECTED NOT DETECTED Final   Listeria monocytogenes NOT DETECTED NOT DETECTED Final   Staphylococcus species NOT DETECTED NOT DETECTED Final   Staphylococcus aureus (BCID) NOT DETECTED NOT DETECTED Final   Staphylococcus epidermidis NOT DETECTED NOT DETECTED Final   Staphylococcus lugdunensis NOT DETECTED NOT DETECTED Final   Streptococcus species DETECTED (A) NOT DETECTED Final    Comment: Not Enterococcus species, Streptococcus agalactiae, Streptococcus pyogenes, or Streptococcus pneumoniae. CRITICAL RESULT CALLED TO, READ BACK BY AND VERIFIED WITH: HURTH PHARMD @0920  10/31/20 EB    Streptococcus agalactiae NOT DETECTED NOT DETECTED Final   Streptococcus pneumoniae NOT DETECTED NOT DETECTED Final   Streptococcus pyogenes NOT DETECTED NOT DETECTED Final   A.calcoaceticus-baumannii NOT DETECTED NOT DETECTED Final   Bacteroides fragilis NOT DETECTED NOT DETECTED Final   Enterobacterales NOT DETECTED NOT DETECTED Final   Enterobacter cloacae complex NOT DETECTED NOT DETECTED Final   Escherichia coli NOT DETECTED NOT DETECTED Final   Klebsiella aerogenes NOT DETECTED NOT DETECTED Final   Klebsiella oxytoca NOT DETECTED NOT DETECTED Final   Klebsiella pneumoniae NOT DETECTED NOT DETECTED Final   Proteus species NOT DETECTED NOT DETECTED Final   Salmonella species NOT DETECTED NOT DETECTED Final   Serratia marcescens NOT  DETECTED NOT DETECTED Final   Haemophilus influenzae NOT DETECTED NOT DETECTED Final   Neisseria meningitidis NOT DETECTED NOT DETECTED Final   Pseudomonas aeruginosa NOT DETECTED NOT DETECTED Final   Stenotrophomonas maltophilia NOT DETECTED NOT DETECTED Final   Candida albicans NOT DETECTED NOT DETECTED Final   Candida auris NOT DETECTED NOT DETECTED Final   Candida glabrata NOT DETECTED NOT DETECTED Final   Candida krusei NOT DETECTED NOT DETECTED Final   Candida parapsilosis NOT DETECTED NOT DETECTED Final   Candida tropicalis NOT DETECTED NOT DETECTED Final   Cryptococcus neoformans/gattii NOT DETECTED NOT DETECTED Final    Comment: Performed at [facility], [address] [place], [place] [address]  Resp Panel by RT-PCR (Flu A&B, Covid)     Status: None   Collection Time: 10/29/20  2:22 PM   Specimen: Nasopharyngeal(NP)  swabs in vial transport medium  Result Value Ref Range Status   SARS Coronavirus 2 by RT PCR NEGATIVE NEGATIVE Final    Comment: (NOTE) SARS-CoV-2 target nucleic acids are NOT DETECTED.  The SARS-CoV-2 RNA is generally detectable in upper respiratory specimens during the acute phase of infection. The lowest concentration of SARS-CoV-2 viral copies this assay can detect is 138 copies/mL. A negative result does not preclude SARS-Cov-2 infection and should not be used as the sole basis for treatment or other patient management decisions. A negative result may occur with  improper specimen collection/handling, submission of specimen other than nasopharyngeal swab, presence of viral mutation(s) within the areas targeted by this assay, and inadequate number of viral copies(<138 copies/mL). A negative result must be combined with clinical observations, patient history, and epidemiological information. The expected result is Negative.  Fact Sheet for Patients:  BloggerCourse.com  Fact Sheet for Healthcare Providers:   SeriousBroker.it  This test is no t yet approved or cleared by the [place] FDA and  has been authorized for detection and/or diagnosis of SARS-CoV-2 by FDA under an Emergency Use Authorization (EUA). This EUA will remain  in effect (meaning this test can be used) for the duration of the COVID-19 declaration under Section 564(b)(1) of the Act, 21 U.S.C.section 360bbb-3(b)(1), unless the authorization is terminated  or revoked sooner.       Influenza A by PCR NEGATIVE NEGATIVE Final   Influenza B by PCR NEGATIVE NEGATIVE Final    Comment: (NOTE) The Xpert Xpress SARS-CoV-2/FLU/RSV plus assay is intended as an aid in the diagnosis of influenza from Nasopharyngeal swab specimens and should not be used as a sole basis for treatment. Nasal washings and aspirates are unacceptable for Xpert Xpress SARS-CoV-2/FLU/RSV testing.  Fact Sheet for Patients: BloggerCourse.com  Fact Sheet for Healthcare Providers: SeriousBroker.it  This test is not yet approved or cleared by the [place] FDA and has been authorized for detection and/or diagnosis of SARS-CoV-2 by FDA under an Emergency Use Authorization (EUA). This EUA will remain in effect (meaning this test can be used) for the duration of the COVID-19 declaration under Section 564(b)(1) of the Act, 21 U.S.C. section 360bbb-3(b)(1), unless the authorization is terminated or revoked.  Performed at [facility], [address] [place], [place] [address]   Blood Culture (routine x 2)     Status: None   Collection Time: 10/29/20  2:28 PM   Specimen: BLOOD  Result Value Ref Range Status   Specimen Description BLOOD SITE NOT SPECIFIED  Final   Special Requests   Final    BOTTLES DRAWN AEROBIC AND ANAEROBIC Blood Culture adequate volume   Culture   Final    NO GROWTH 5 DAYS Performed at [facility], [address], [place], [place] [address]     Report Status 11/03/2020 FINAL  Final  Urine Culture     Status: Abnormal   Collection Time: 10/29/20  3:45 PM   Specimen: In/Out Cath Urine  Result Value Ref Range Status   Specimen Description IN/OUT CATH URINE  Final   Special Requests   Final    NONE Performed at [facility], [address] [place], [place] [address]    Culture MULTIPLE SPECIES PRESENT, SUGGEST RECOLLECTION (A)  Final   Report Status 10/30/2020 FINAL  Final  Culture, blood (routine x 2)     Status: None (Preliminary result)   Collection Time: 10/31/20  2:30 PM   Specimen: BLOOD  Result Value Ref Range Status   Specimen Description BLOOD RIGHT ANTECUBITAL  Final   Special Requests   Final    BOTTLES DRAWN AEROBIC ONLY Blood Culture results may not be optimal due to an inadequate volume of blood received in culture bottles   Culture   Final    NO GROWTH 4 DAYS Performed at [facility], [address] [place], [place] [address]    Report Status PENDING  Incomplete  Culture, blood (routine x 2)     Status: None (Preliminary result)   Collection Time: 10/31/20  2:35 PM   Specimen: BLOOD RIGHT HAND  Result Value Ref Range Status   Specimen Description BLOOD RIGHT HAND  Final   Special Requests   Final    BOTTLES DRAWN AEROBIC ONLY Blood Culture results may not be optimal due to an inadequate volume of blood received in culture bottles   Culture   Final    NO GROWTH 4 DAYS Performed at [facility], [address] [place], [place] [address]    Report Status PENDING  Incomplete    Pertinent Lab. CBC Latest Ref Rng & Units 11/04/2020 11/03/2020 11/02/2020  WBC 4.0 - 10.5 K/uL 10.5 11.1(H) 13.2(H)  Hemoglobin 12.0 - 15.0 g/dL 10.1(L) 9.8(L) 10.1(L)  Hematocrit 36.0 - 46.0 % 31.0(L) 30.4(L) 32.0(L)  Platelets 150 - 400 K/uL 320 293 231   CMP Latest Ref Rng & Units 11/04/2020 11/03/2020 11/02/2020  Glucose 70 - 99 mg/dL 536(U) 90 77  BUN 8 - 23 mg/dL 8 9 7(L)  Creatinine 4.40 - 1.00  mg/dL 3.47 4.25(Z) 5.63(O)  Sodium 135 - 145 mmol/L 135 133(L) 130(L)  Potassium 3.5 - 5.1 mmol/L 3.7 2.9(L) 3.5  Chloride 98 - 111 mmol/L 108 103 101  CO2 22 - 32 mmol/L 18(L) 19(L) 18(L)  Calcium 8.9 - 10.3 mg/dL 8.2(L) 8.1(L) 8.1(L)  Total Protein 6.5 - 8.1 g/dL - - -  Total Bilirubin 0.3 - 1.2 mg/dL - - -  Alkaline Phos 38 - 126 U/L - - -  AST 15 - 41 U/L - - -  ALT 0 - 44 U/L - - -     Pertinent Imaging today Plain films and CT images have been personally visualized and interpreted; radiology reports have been reviewed. Decision making incorporated into the Impression / Recommendations.  Chest Xray 11/02/2020 FINDINGS: Single frontal view of the chest was obtained with the patient rotated to the left. The cardiac silhouette is unremarkable. No acute airspace disease, effusion, or pneumothorax. No acute bony abnormalities.   IMPRESSION: 1. No acute intrathoracic process.   CT head WO contrast 11/03/2020 FINDINGS: Brain: There is no mass, hemorrhage or extra-axial collection. The size and configuration of the ventricles and extra-axial CSF spaces are normal. There is hypoattenuation of the white matter, most commonly indicating chronic small vessel disease.   Vascular: No abnormal hyperdensity of the major intracranial arteries or dural venous sinuses. No intracranial atherosclerosis.   Skull: The visualized skull base, calvarium and extracranial soft tissues are normal.   Sinuses/Orbits: No fluid levels or advanced mucosal thickening of the visualized paranasal sinuses. No mastoid or middle ear effusion. The orbits are normal.   IMPRESSION: Chronic small vessel disease without acute intracranial abnormality.  TTE 11/01/2020 Left ventricular ejection fraction, by estimation, is 60 to 65%. The left ventricle has normal function. The left ventricle has no regional wall motion abnormalities. Left ventricular diastolic function could not be evaluated. 1. Right  ventricular systolic function  is normal. The right ventricular size is normal. There is mildly elevated pulmonary artery systolic pressure. 2. 3. Left atrial size was mildly dilated. 4. The mitral valve is normal in structure. Trivial mitral valve regurgitation. The aortic valve is normal in structure. Aortic valve regurgitation is mild. Mild aortic valve sclerosis is present, with no evidence of aortic valve stenosis. 5. The inferior vena cava is dilated in size with >50% respiratory variability, suggesting right atrial pressure of 8 mmHg. 6. Conclusion(s)/Recommendation(s): No evidence of valvular vegetations on this transthoracic echocardiogram. Would recommend a transesophageal echocardiogram to exclude infective endocarditis if clinically indicated.  I spent more than 35 minutes for this patient encounter including review of prior medical records, coordination of care  with greater than 50% of time being face to face/counseling and discussing diagnostics/treatment plan with the patient/family.  Electronically signed by:   Odette Fraction, MD Infectious Disease Physician [facility] Pager: [phone]

## 2020-11-05 ENCOUNTER — Inpatient Hospital Stay (HOSPITAL_COMMUNITY): Payer: Medicare Other

## 2020-11-05 DIAGNOSIS — R7881 Bacteremia: Secondary | ICD-10-CM

## 2020-11-05 DIAGNOSIS — A419 Sepsis, unspecified organism: Secondary | ICD-10-CM | POA: Diagnosis not present

## 2020-11-05 DIAGNOSIS — R6520 Severe sepsis without septic shock: Secondary | ICD-10-CM | POA: Diagnosis not present

## 2020-11-05 LAB — HEPATIC FUNCTION PANEL
ALT: 16 U/L (ref 0–44)
AST: 20 U/L (ref 15–41)
Albumin: 2 g/dL — ABNORMAL LOW (ref 3.5–5.0)
Alkaline Phosphatase: 70 U/L (ref 38–126)
Bilirubin, Direct: <0.1 mg/dL (ref 0.0–0.2)
Total Bilirubin: 0.6 mg/dL (ref 0.3–1.2)
Total Protein: 5.4 g/dL — ABNORMAL LOW (ref 6.5–8.1)

## 2020-11-05 LAB — CULTURE, BLOOD (ROUTINE X 2)
Culture: NO GROWTH
Culture: NO GROWTH

## 2020-11-05 LAB — CREATININE, SERUM
Creatinine, Ser: 0.91 mg/dL (ref 0.44–1.00)
GFR, Estimated: >60 mL/min (ref >60)

## 2020-11-05 MED ORDER — IPRATROPIUM-ALBUTEROL 0.5-2.5 (3) MG/3ML IN SOLN: 3.0000 mL | Freq: Four times a day (QID) | RESPIRATORY_TRACT | Status: DC | PRN

## 2020-11-05 MED ORDER — VANCOMYCIN HCL 1750 MG/350ML IV SOLN
1750.0000 mg | Freq: Once | INTRAVENOUS | Status: AC
  Administered 2020-11-05: 1750 mg via INTRAVENOUS
  Filled 2020-11-05: qty 350

## 2020-11-05 MED ORDER — MENTHOL 3 MG MT LOZG
1.0000 | LOZENGE | OROMUCOSAL | Status: DC | PRN
  Administered 2020-11-05: 3 mg via ORAL
  Filled 2020-11-05 (×2): qty 9

## 2020-11-05 MED ORDER — VANCOMYCIN HCL 750 MG/150ML IV SOLN
750.0000 mg | INTRAVENOUS | Status: DC
  Administered 2020-11-06 – 2020-11-08 (×3): 750 mg via INTRAVENOUS
  Filled 2020-11-05 (×3): qty 150

## 2020-11-05 NOTE — Progress Notes (Signed)
Ibuprofen 600 mg p.o. given for Temp. 103.2 per M.D. orders

## 2020-11-05 NOTE — Progress Notes (Signed)
PROGRESS NOTE    Andrea Yu  AVW:[id number] DOB: [birth year] DOA: 10/29/2020 PCP: Jarome Matin, MD   Brief Narrative: 80 year old with past medical history significant for paroxysmal A. fib, GERD, neuropathy, morbid obesity, psoriatic arthritis, iron deficiency anemia, history of subarachnoid hemorrhage, hypertension who presents to the ED complaining of the urinary frequency and decreased oral intake.  Patient report having UTI and was prescribed Bactrim by her PCP.  She completed course and despite still continues to have dysuria.  Patient was noted to be slightly confused. Evaluation in the ED patient was tachycardic, hypotensive, tachypneic, febrile.  Leukocytosis white blood cell 15.3.  UA moderate leukocyte.  CT abdomen show fluid collection inferior portion of the right buttock region inferior to the right ischial tuberosity.  Patient was admitted with sepsis, streptococcal parasanguinis bacteremia, ? UTI.  Plan to proceed with TEE tomorrow to rule out endocarditis  Assessment & Plan:   Sepsis secondary to UTI,  Rule out bacteremia - single positive Streptococcus parasanguinis.  Presented with fever tachycardia, tachypnea and hypotension responding well to IV fluids. DC ceftriaxone continues to have intermittent fevers - broaden to vancomycin 1-4 Blood culture positive for Streptococcus species. .  WBC down to 11. Repeated Blood culture 10/07: no growth to date.  TEE pending to rule out endocarditis - first available 11/07/20  Chronic fluid collection of right gluteal area - unlikely abscess, POA.  Present  with fever, hypotension concern for sepsis.  General surgery and ID has been consulted.  She had a prior history of abscess.  Per ID and surgery no need for drainage - unlikely abscess per their evaluation.   Acute Metabolic Encephalopathy,resolving  CT head no acute intracranial pathology - MRI refused Repeated CT scan after 24-48 hours symptoms negative for stroke.    A. fib with RVR: Cardiology consulted and following On oral Cardizem.  TTE/ECHO; normal EF  Hypertension: Continue with Cardizem  Generalized weakness: PT OT  Depression Continue with Cymbalta. HypoKalemia: Replaced. Hyponatremia; received fluids.  Peripheral neuropathy: Continue with gabapentin   Pressure Injury 04/29/17 Stage II -  Partial thickness loss of dermis presenting as a shallow open ulcer with a red, pink wound bed without slough. healing callus, open pink wound bed (Active)  04/29/17 0800  Location: Heel  Location Orientation: Right  Staging: Stage II -  Partial thickness loss of dermis presenting as a shallow open ulcer with a red, pink wound bed without slough.  Wound Description (Comments): healing callus, open pink wound bed  Present on Admission:      Pressure Injury 10/30/20 Buttocks Right healed (Active)  10/30/20 2000  Location: Buttocks  Location Orientation: Right  Staging:   Wound Description (Comments): healed  Present on Admission: Yes        Estimated body mass index is 38.69 kg/m as calculated from the following:   Height as of this encounter:  (1.499 m).   Weight as of this encounter: 86.9 kg.   DVT prophylaxis: Lovenox Code Status: Full code Family Communication: Daughter over phone 10/10 Disposition Plan:  Status is: Inpatient  Remains inpatient appropriate because:IV treatments appropriate due to intensity of illness or inability to take PO  Dispo: The patient is from: Home              Anticipated d/c is to: Home              Patient currently is not medically stable to d/c.   Difficult to place patient No  Consultants:  ID General surgery  Procedures:    Antimicrobials:  Vanc/flagyl/aztreonam in ED x1 -  10/29/20 Ceftriaxone 10/7-10/11 Vancomycin 10/12 - ongoing  Subjective: No acute issues or events overnight, denies nausea vomiting diarrhea constipation headache fevers chills or chest  pain  Objective: Vitals:   11/04/20 2222 11/04/20 2300 11/05/20 0412 11/05/20 0606  BP: 118/62 (!) 103/48 (!) 94/51 (!) 92/58  Pulse: (!) 116 87 79 75  Resp: (!) 22 (!) 24 20 20   Temp: 98.8 F (37.1 C) 97.8 F (36.6 C) 97.7 F (36.5 C) 97.6 F (36.4 C)  TempSrc: Oral Oral Oral Oral  SpO2: 96% 96% 100% 100%  Weight:      Height:        Intake/Output Summary (Last 24 hours) at 11/05/2020 0822 Last data filed at 11/05/2020 0454 Gross per 24 hour  Intake 720 ml  Output 300 ml  Net 420 ml    Filed Weights   10/30/20 0700 11/04/20 0327  Weight: 83 kg 86.9 kg    Examination:  General:  Pleasantly resting in bed, No acute distress. HEENT:  Normocephalic atraumatic.  Sclerae nonicteric, noninjected.  Extraocular movements intact bilaterally. Neck:  Without mass or deformity.  Trachea is midline. Lungs:  Clear to auscultate bilaterally without rhonchi, wheeze, or rales. Heart:  Regular rate and rhythm.  Without murmurs, rubs, or gallops. Abdomen:  Soft, nontender, nondistended.  Without guarding or rebound. Extremities: Without cyanosis, clubbing, edema, or obvious deformity. Vascular:  Dorsalis pedis and posterior tibial pulses palpable bilaterally. Skin:  Warm and dry  Data Reviewed: I have personally reviewed following labs and imaging studies  CBC: Recent Labs  Lab 10/29/20 1428 10/29/20 1629 10/31/20 0159 11/01/20 0753 11/02/20 0746 11/03/20 0907 11/04/20 0331  WBC 15.3*   < > 16.0* 14.7* 13.2* 11.1* 10.5  NEUTROABS 12.1*  --   --   --   --   --   --   HGB 12.0   < > 9.4* 9.7* 10.1* 9.8* 10.1*  HCT 38.1   < > 29.6* 30.0* 32.0* 30.4* 31.0*  MCV 89.6   < > 88.6 86.5 88.4 85.2 84.5  PLT 197   < > 184 217 231 293 320   < > = values in this interval not displayed.    Basic Metabolic Panel: Recent Labs  Lab 10/31/20 0159 11/01/20 0753 11/02/20 0746 11/03/20 0907 11/04/20 0331 11/05/20 0457  NA 133* 131* 130* 133* 135  --   K 3.4* 3.5 3.5 2.9* 3.7  --    CL 103 101 101 103 108  --   CO2 18* 18* 18* 19* 18*  --   GLUCOSE 83 78 77 90 112*  --   BUN 10 9 7* 9 8  --   CREATININE 0.91 0.96 1.09* 1.02* 0.95 0.91  CALCIUM 8.1* 8.0* 8.1* 8.1* 8.2*  --   MG  --   --   --  1.7  --   --     GFR: Estimated Creatinine Clearance: 46.5 mL/min (by C-G formula based on SCr of 0.91 mg/dL). Liver Function Tests: Recent Labs  Lab 10/29/20 1428 10/30/20 0558  AST 22 21  ALT 19 18  ALKPHOS 76 61  BILITOT 0.9 1.0  PROT 6.7 5.2*  ALBUMIN 2.9* 2.4*    No results for input(s): LIPASE, AMYLASE in the last 168 hours. No results for input(s): AMMONIA in the last 168 hours. Coagulation Profile: Recent Labs  Lab 10/29/20 1428 10/30/20 0558  INR 1.2 1.2  Cardiac Enzymes: No results for input(s): CKTOTAL, CKMB, CKMBINDEX, TROPONINI in the last 168 hours. BNP (last 3 results) No results for input(s): PROBNP in the last 8760 hours. HbA1C: No results for input(s): HGBA1C in the last 72 hours. CBG: Recent Labs  Lab 11/04/20 0517  GLUCAP 126*    Lipid Profile: No results for input(s): CHOL, HDL, LDLCALC, TRIG, CHOLHDL, LDLDIRECT in the last 72 hours. Thyroid Function Tests: Recent Labs    11/03/20 0907  TSH 3.168    Anemia Panel: No results for input(s): VITAMINB12, FOLATE, FERRITIN, TIBC, IRON, RETICCTPCT in the last 72 hours. Sepsis Labs: Recent Labs  Lab 10/29/20 1428 10/29/20 1710 10/30/20 0558  PROCALCITON  --   --  1.20  LATICACIDVEN 1.0 1.8  --      Recent Results (from the past 240 hour(s))  Blood Culture (routine x 2)     Status: Abnormal (Preliminary result)   Collection Time: 10/29/20 10:45 AM   Specimen: BLOOD  Result Value Ref Range Status   Specimen Description BLOOD BLOOD RIGHT FOREARM  Final   Special Requests   Final    BOTTLES DRAWN AEROBIC AND ANAEROBIC Blood Culture adequate volume   Culture  Setup Time   Final    GRAM POSITIVE COCCI AEROBIC BOTTLE ONLY CRITICAL RESULT CALLED TO, READ BACK BY AND  VERIFIED WITH: HURTH PHARMD @0920  10/31/20 EB    Culture (A)  Final    STREPTOCOCCUS PARASANGUINIS SUSCEPTIBILITIES TO FOLLOW Performed at [facility], [address] [place], [place]    Report Status PENDING  Incomplete  Blood Culture ID Panel (Reflexed)     Status: Abnormal   Collection Time: 10/29/20 10:45 AM  Result Value Ref Range Status   Enterococcus faecalis NOT DETECTED NOT DETECTED Final   Enterococcus Faecium NOT DETECTED NOT DETECTED Final   Listeria monocytogenes NOT DETECTED NOT DETECTED Final   Staphylococcus species NOT DETECTED NOT DETECTED Final   Staphylococcus aureus (BCID) NOT DETECTED NOT DETECTED Final   Staphylococcus epidermidis NOT DETECTED NOT DETECTED Final   Staphylococcus lugdunensis NOT DETECTED NOT DETECTED Final   Streptococcus species DETECTED (A) NOT DETECTED Final    Comment: Not Enterococcus species, Streptococcus agalactiae, Streptococcus pyogenes, or Streptococcus pneumoniae. CRITICAL RESULT CALLED TO, READ BACK BY AND VERIFIED WITH: HURTH PHARMD @0920  10/31/20 EB    Streptococcus agalactiae NOT DETECTED NOT DETECTED Final   Streptococcus pneumoniae NOT DETECTED NOT DETECTED Final   Streptococcus pyogenes NOT DETECTED NOT DETECTED Final   A.calcoaceticus-baumannii NOT DETECTED NOT DETECTED Final   Bacteroides fragilis NOT DETECTED NOT DETECTED Final   Enterobacterales NOT DETECTED NOT DETECTED Final   Enterobacter cloacae complex NOT DETECTED NOT DETECTED Final   Escherichia coli NOT DETECTED NOT DETECTED Final   Klebsiella aerogenes NOT DETECTED NOT DETECTED Final   Klebsiella oxytoca NOT DETECTED NOT DETECTED Final   Klebsiella pneumoniae NOT DETECTED NOT DETECTED Final   Proteus species NOT DETECTED NOT DETECTED Final   Salmonella species NOT DETECTED NOT DETECTED Final   Serratia marcescens NOT DETECTED NOT DETECTED Final   Haemophilus influenzae NOT DETECTED NOT DETECTED Final   Neisseria meningitidis NOT DETECTED NOT  DETECTED Final   Pseudomonas aeruginosa NOT DETECTED NOT DETECTED Final   Stenotrophomonas maltophilia NOT DETECTED NOT DETECTED Final   Candida albicans NOT DETECTED NOT DETECTED Final   Candida auris NOT DETECTED NOT DETECTED Final   Candida glabrata NOT DETECTED NOT DETECTED Final   Candida krusei NOT DETECTED NOT DETECTED Final  Candida parapsilosis NOT DETECTED NOT DETECTED Final   Candida tropicalis NOT DETECTED NOT DETECTED Final   Cryptococcus neoformans/gattii NOT DETECTED NOT DETECTED Final    Comment: Performed at [facility], [address] [place], [place] [address]  Resp Panel by RT-PCR (Flu A&B, Covid)     Status: None   Collection Time: 10/29/20  2:22 PM   Specimen: Nasopharyngeal(NP) swabs in vial transport medium  Result Value Ref Range Status   SARS Coronavirus 2 by RT PCR NEGATIVE NEGATIVE Final    Comment: (NOTE) SARS-CoV-2 target nucleic acids are NOT DETECTED.  The SARS-CoV-2 RNA is generally detectable in upper respiratory specimens during the acute phase of infection. The lowest concentration of SARS-CoV-2 viral copies this assay can detect is 138 copies/mL. A negative result does not preclude SARS-Cov-2 infection and should not be used as the sole basis for treatment or other patient management decisions. A negative result may occur with  improper specimen collection/handling, submission of specimen other than nasopharyngeal swab, presence of viral mutation(s) within the areas targeted by this assay, and inadequate number of viral copies(<138 copies/mL). A negative result must be combined with clinical observations, patient history, and epidemiological information. The expected result is Negative.  Fact Sheet for Patients:  BloggerCourse.com  Fact Sheet for Healthcare Providers:  SeriousBroker.it  This test is no t yet approved or cleared by the [place] FDA and  has been authorized for  detection and/or diagnosis of SARS-CoV-2 by FDA under an Emergency Use Authorization (EUA). This EUA will remain  in effect (meaning this test can be used) for the duration of the COVID-19 declaration under Section 564(b)(1) of the Act, 21 U.S.C.section 360bbb-3(b)(1), unless the authorization is terminated  or revoked sooner.       Influenza A by PCR NEGATIVE NEGATIVE Final   Influenza B by PCR NEGATIVE NEGATIVE Final    Comment: (NOTE) The Xpert Xpress SARS-CoV-2/FLU/RSV plus assay is intended as an aid in the diagnosis of influenza from Nasopharyngeal swab specimens and should not be used as a sole basis for treatment. Nasal washings and aspirates are unacceptable for Xpert Xpress SARS-CoV-2/FLU/RSV testing.  Fact Sheet for Patients: BloggerCourse.com  Fact Sheet for Healthcare Providers: SeriousBroker.it  This test is not yet approved or cleared by the [place] FDA and has been authorized for detection and/or diagnosis of SARS-CoV-2 by FDA under an Emergency Use Authorization (EUA). This EUA will remain in effect (meaning this test can be used) for the duration of the COVID-19 declaration under Section 564(b)(1) of the Act, 21 U.S.C. section 360bbb-3(b)(1), unless the authorization is terminated or revoked.  Performed at [facility], [address], [place], [place] [address]   Blood Culture (routine x 2)     Status: None   Collection Time: 10/29/20  2:28 PM   Specimen: BLOOD  Result Value Ref Range Status   Specimen Description BLOOD SITE NOT SPECIFIED  Final   Special Requests   Final    BOTTLES DRAWN AEROBIC AND ANAEROBIC Blood Culture adequate volume   Culture   Final    NO GROWTH 5 DAYS Performed at [facility], [address], [place], [place] [address]    Report Status 11/03/2020 FINAL  Final  Urine Culture     Status: Abnormal   Collection Time: 10/29/20  3:45 PM   Specimen: In/Out Cath  Urine  Result Value Ref Range Status   Specimen Description IN/OUT CATH URINE  Final   Special  Requests   Final    NONE Performed at [facility], [address], [place], [place] [address]    Culture MULTIPLE SPECIES PRESENT, SUGGEST RECOLLECTION (A)  Final   Report Status 10/30/2020 FINAL  Final  Culture, blood (routine x 2)     Status: None (Preliminary result)   Collection Time: 10/31/20  2:30 PM   Specimen: BLOOD  Result Value Ref Range Status   Specimen Description BLOOD RIGHT ANTECUBITAL  Final   Special Requests   Final    BOTTLES DRAWN AEROBIC ONLY Blood Culture results may not be optimal due to an inadequate volume of blood received in culture bottles   Culture   Final    NO GROWTH 4 DAYS Performed at [facility], [address] [place], [place] [address]    Report Status PENDING  Incomplete  Culture, blood (routine x 2)     Status: None (Preliminary result)   Collection Time: 10/31/20  2:35 PM   Specimen: BLOOD RIGHT HAND  Result Value Ref Range Status   Specimen Description BLOOD RIGHT HAND  Final   Special Requests   Final    BOTTLES DRAWN AEROBIC ONLY Blood Culture results may not be optimal due to an inadequate volume of blood received in culture bottles   Culture   Final    NO GROWTH 4 DAYS Performed at [facility], [address] [place], [place] [address]    Report Status PENDING  Incomplete  Urine Culture     Status: None   Collection Time: 11/04/20  5:50 AM   Specimen: Urine, Clean Catch  Result Value Ref Range Status   Specimen Description URINE, CLEAN CATCH  Final   Special Requests NONE  Final   Culture   Final    NO GROWTH Performed at [facility], [address] [place], [place] [address]    Report Status 11/04/2020 FINAL  Final          Radiology Studies: US RENAL  Result Date: 11/04/2020 CLINICAL DATA:  Hypertension, pyelonephritis. EXAM: RENAL / URINARY TRACT ULTRASOUND COMPLETE COMPARISON:  CT scan  10/29/2020 FINDINGS: Right Kidney: Renal measurements: 13.0 by 4.7 by 5.9 cm = volume: 167 mL. Echogenicity within normal limits. No solid mass or hydronephrosis visualized. Indistinct posterior 1.4 by 0.8 cm exophytic lesion from the right kidney upper pole posteriorly corresponding to the small cyst seen on the CT of 10/29/2020. Left Kidney: Renal measurements: 12.4 by 5.1 by 5.0 cm = volume: 164 mL. Echogenicity within normal limits. No mass or hydronephrosis visualized. 0.8 cm cyst of the upper pole on image 22 series 1-1. Bladder: Appears normal for degree of bladder distention. Other: None. IMPRESSION: 1. Single tiny bilateral upper pole cysts. 2. No evidence of hydronephrosis or hydroureter. No additional significant renal parenchymal findings. Please note that pyelonephritis can be occult on sonography. No renal abscess is identified. Electronically Signed   By: Gaylyn Rong M.D.   On: 11/04/2020 14:06        Scheduled Meds:  aspirin EC  81 mg Oral QPM   diltiazem  30 mg Oral Q6H   docusate sodium  100 mg Oral BID   DULoxetine  90 mg Oral Daily   enoxaparin (LOVENOX) injection  40 mg Subcutaneous Q24H   ipratropium-albuterol  3 mL Nebulization Q6H   metoprolol succinate  50 mg Oral Daily   nystatin  5 mL Oral QID   pantoprazole  40 mg Oral Daily  Continuous Infusions:  cefTRIAXone (ROCEPHIN)  IV 2 g (11/04/20 1300)     LOS: 7 days    Time spent: 35 minutes.     Azucena Fallen, DO [facility]   If 7PM-7AM, please contact night-coverage [email or web address]  11/05/2020, 8:22 AM

## 2020-11-05 NOTE — Progress Notes (Signed)
[facility] Infectious Diseases Follow Up Note  Patient Identification: Patient Name: Andrea Yu MRN: [id number] Admit Date: 10/29/2020  1:41 PM Age: 80 y.o.Today's Date: 11/05/2020   Reason for Visit: Streptococcus bacteremia/Fevers  Principal Problem:   Severe sepsis [facility]) Active Problems:   Adrenal insufficiency (HCC)   PAF (paroxysmal atrial fibrillation) (HCC)   Psoriatic arthritis (HCC)   Iron deficiency anemia- transfused this admission   Depression   Peripheral neuropathy   Obesity (BMI 30-39.9)- suspected sleep apnea, declines sleep study   SAH (subarachnoid hemorrhage) (HCC)   Essential hypertension   GERD (gastroesophageal reflux disease)   Antibiotics: Aztreonam 10/5-10/6                    Metronidazole 10/5                    Vancomycin 10/5                    Ceftriaxone 10/7-current  Lines/Hardware:   Interval Events: T max yesterday 103, no leukocytosis. Mental status seems to be improving. No concerns otherwise.    Assessment:  Intermittent fevers ; ? UTI given UA( no growth in urine cx, US renal unremarkable), Few episodes of loose stool per RN but not watery and benign abdominal exam. No lines. R/o endocarditis. R/o DVT ? Drug related fevers; Non ID causes.   Streptococcal Parasanguinis 1/4 bottles  3.   Encephalopathy: seems to have improved  4.   Fluid collection in the RT buttock: seems to be chronic without any signs of acute inflammation on exam, seen by surgery and less likely to be an abscess causing fevers   Comments: Unclear source of fever. Also unclear if strep isolated in 1/4 bottles is a contaminant vs a true pathogen ( given fevers and altered mental status which seems to be somewhat improving based on documentation on on prior notes). TTE is negative for endocarditis. TEE is planned for 10/14  Recommendations Will change ceftriaxone to Vancomycin, pharmacy to dose for the time being  for possibility of drug related fever. Requested for sensitivities for Strep parasanguinis for cefazolin  Venous Duplex of Lower extremities to r/o DVT  TEE on 10/14 Consider CT chest abdomen pelvis for occult source of infection  if TEE negative for endocarditis R/o Non ID causes of Fevers  Discussed with Primary   Rest of the management as per the primary team. Thank you for the consult. Please page with pertinent questions or concerns.  ______________________________________________________________________ Subjective patient seen and examined at the bedside. She is lying in the bed. She is asking to go home and tells me she will feel better is she goes home.   Vitals BP (!) 113/57 (BP Location: Left Arm)   Pulse 81   Temp 97.9 F (36.6 C) (Oral)   Resp 20   Ht 4\' 11"  (1.499 m)   Wt 86.9 kg   SpO2 100%   BMI 38.69 kg/m     Physical Exam Constitutional:  Not in acute distress, lying in the bed.     Comments:   Cardiovascular:     Rate and Rhythm: Normal rate and regular rhythm.     Heart sounds:   Pulmonary:     Effort: Pulmonary effort is normal.     Comments:   Abdominal:     Palpations: Abdomen is soft.     Tenderness: Non tender and non distended   Musculoskeletal:  General: No peripheral joint swelling and tenderness  Skin:    Comments: No obvious lesions or rashes. No wounds at the back   Neurological:     General: able to move all extremities spontaneously  Psychiatric:        Mood and Affect: Mood normal.   Pertinent Microbiology Results for orders placed or performed during the hospital encounter of 10/29/20  Blood Culture (routine x 2)     Status: Abnormal   Collection Time: 10/29/20 10:45 AM   Specimen: BLOOD  Result Value Ref Range Status   Specimen Description BLOOD BLOOD RIGHT FOREARM  Final   Special Requests   Final    BOTTLES DRAWN AEROBIC AND ANAEROBIC Blood Culture adequate volume   Culture  Setup Time   Final    GRAM POSITIVE  COCCI AEROBIC BOTTLE ONLY CRITICAL RESULT CALLED TO, READ BACK BY AND VERIFIED WITH: HURTH PHARMD @0920  10/31/20 EB Performed at [facility], [address] [place], [place]    Culture STREPTOCOCCUS PARASANGUINIS (A)  Final   Report Status 11/05/2020 FINAL  Final   Organism ID, Bacteria STREPTOCOCCUS PARASANGUINIS  Final      Susceptibility   Streptococcus parasanguinis - MIC*    PENICILLIN 2 INTERMEDIATE Intermediate     CEFTRIAXONE 1 SENSITIVE Sensitive     LEVOFLOXACIN >=16 RESISTANT Resistant     VANCOMYCIN 0.5 SENSITIVE Sensitive     * STREPTOCOCCUS PARASANGUINIS  Blood Culture ID Panel (Reflexed)     Status: Abnormal   Collection Time: 10/29/20 10:45 AM  Result Value Ref Range Status   Enterococcus faecalis NOT DETECTED NOT DETECTED Final   Enterococcus Faecium NOT DETECTED NOT DETECTED Final   Listeria monocytogenes NOT DETECTED NOT DETECTED Final   Staphylococcus species NOT DETECTED NOT DETECTED Final   Staphylococcus aureus (BCID) NOT DETECTED NOT DETECTED Final   Staphylococcus epidermidis NOT DETECTED NOT DETECTED Final   Staphylococcus lugdunensis NOT DETECTED NOT DETECTED Final   Streptococcus species DETECTED (A) NOT DETECTED Final    Comment: Not Enterococcus species, Streptococcus agalactiae, Streptococcus pyogenes, or Streptococcus pneumoniae. CRITICAL RESULT CALLED TO, READ BACK BY AND VERIFIED WITH: HURTH PHARMD @0920  10/31/20 EB    Streptococcus agalactiae NOT DETECTED NOT DETECTED Final   Streptococcus pneumoniae NOT DETECTED NOT DETECTED Final   Streptococcus pyogenes NOT DETECTED NOT DETECTED Final   A.calcoaceticus-baumannii NOT DETECTED NOT DETECTED Final   Bacteroides fragilis NOT DETECTED NOT DETECTED Final   Enterobacterales NOT DETECTED NOT DETECTED Final   Enterobacter cloacae complex NOT DETECTED NOT DETECTED Final   Escherichia coli NOT DETECTED NOT DETECTED Final   Klebsiella aerogenes NOT DETECTED NOT DETECTED Final    Klebsiella oxytoca NOT DETECTED NOT DETECTED Final   Klebsiella pneumoniae NOT DETECTED NOT DETECTED Final   Proteus species NOT DETECTED NOT DETECTED Final   Salmonella species NOT DETECTED NOT DETECTED Final   Serratia marcescens NOT DETECTED NOT DETECTED Final   Haemophilus influenzae NOT DETECTED NOT DETECTED Final   Neisseria meningitidis NOT DETECTED NOT DETECTED Final   Pseudomonas aeruginosa NOT DETECTED NOT DETECTED Final   Stenotrophomonas maltophilia NOT DETECTED NOT DETECTED Final   Candida albicans NOT DETECTED NOT DETECTED Final   Candida auris NOT DETECTED NOT DETECTED Final   Candida glabrata NOT DETECTED NOT DETECTED Final   Candida krusei NOT DETECTED NOT DETECTED Final   Candida parapsilosis NOT DETECTED NOT DETECTED Final   Candida tropicalis NOT DETECTED NOT DETECTED Final   Cryptococcus neoformans/gattii NOT DETECTED NOT DETECTED  Final    Comment: Performed at [facility], [address], [place], [place] [address]  Resp Panel by RT-PCR (Flu A&B, Covid)     Status: None   Collection Time: 10/29/20  2:22 PM   Specimen: Nasopharyngeal(NP) swabs in vial transport medium  Result Value Ref Range Status   SARS Coronavirus 2 by RT PCR NEGATIVE NEGATIVE Final    Comment: (NOTE) SARS-CoV-2 target nucleic acids are NOT DETECTED.  The SARS-CoV-2 RNA is generally detectable in upper respiratory specimens during the acute phase of infection. The lowest concentration of SARS-CoV-2 viral copies this assay can detect is 138 copies/mL. A negative result does not preclude SARS-Cov-2 infection and should not be used as the sole basis for treatment or other patient management decisions. A negative result may occur with  improper specimen collection/handling, submission of specimen other than nasopharyngeal swab, presence of viral mutation(s) within the areas targeted by this assay, and inadequate number of viral copies(<138 copies/mL). A negative result must be combined  with clinical observations, patient history, and epidemiological information. The expected result is Negative.  Fact Sheet for Patients:  BloggerCourse.com  Fact Sheet for Healthcare Providers:  SeriousBroker.it  This test is no t yet approved or cleared by the [place] FDA and  has been authorized for detection and/or diagnosis of SARS-CoV-2 by FDA under an Emergency Use Authorization (EUA). This EUA will remain  in effect (meaning this test can be used) for the duration of the COVID-19 declaration under Section 564(b)(1) of the Act, 21 U.S.C.section 360bbb-3(b)(1), unless the authorization is terminated  or revoked sooner.       Influenza A by PCR NEGATIVE NEGATIVE Final   Influenza B by PCR NEGATIVE NEGATIVE Final    Comment: (NOTE) The Xpert Xpress SARS-CoV-2/FLU/RSV plus assay is intended as an aid in the diagnosis of influenza from Nasopharyngeal swab specimens and should not be used as a sole basis for treatment. Nasal washings and aspirates are unacceptable for Xpert Xpress SARS-CoV-2/FLU/RSV testing.  Fact Sheet for Patients: BloggerCourse.com  Fact Sheet for Healthcare Providers: SeriousBroker.it  This test is not yet approved or cleared by the [place] FDA and has been authorized for detection and/or diagnosis of SARS-CoV-2 by FDA under an Emergency Use Authorization (EUA). This EUA will remain in effect (meaning this test can be used) for the duration of the COVID-19 declaration under Section 564(b)(1) of the Act, 21 U.S.C. section 360bbb-3(b)(1), unless the authorization is terminated or revoked.  Performed at [facility], [address] [place], [place] [address]   Blood Culture (routine x 2)     Status: None   Collection Time: 10/29/20  2:28 PM   Specimen: BLOOD  Result Value Ref Range Status   Specimen Description BLOOD SITE NOT  SPECIFIED  Final   Special Requests   Final    BOTTLES DRAWN AEROBIC AND ANAEROBIC Blood Culture adequate volume   Culture   Final    NO GROWTH 5 DAYS Performed at [facility], [address] [place], [place] [address]    Report Status 11/03/2020 FINAL  Final  Urine Culture     Status: Abnormal   Collection Time: 10/29/20  3:45 PM   Specimen: In/Out Cath Urine  Result Value Ref Range Status   Specimen Description IN/OUT CATH URINE  Final   Special Requests   Final    NONE Performed at [facility], [address] [place], [place] [address]  Culture MULTIPLE SPECIES PRESENT, SUGGEST RECOLLECTION (A)  Final   Report Status 10/30/2020 FINAL  Final  Culture, blood (routine x 2)     Status: None   Collection Time: 10/31/20  2:30 PM   Specimen: BLOOD  Result Value Ref Range Status   Specimen Description BLOOD RIGHT ANTECUBITAL  Final   Special Requests   Final    BOTTLES DRAWN AEROBIC ONLY Blood Culture results may not be optimal due to an inadequate volume of blood received in culture bottles   Culture   Final    NO GROWTH 5 DAYS Performed at [facility], [address], [place], [place] [address]    Report Status 11/05/2020 FINAL  Final  Culture, blood (routine x 2)     Status: None   Collection Time: 10/31/20  2:35 PM   Specimen: BLOOD RIGHT HAND  Result Value Ref Range Status   Specimen Description BLOOD RIGHT HAND  Final   Special Requests   Final    BOTTLES DRAWN AEROBIC ONLY Blood Culture results may not be optimal due to an inadequate volume of blood received in culture bottles   Culture   Final    NO GROWTH 5 DAYS Performed at [facility], [address] [place], [place] [address]    Report Status 11/05/2020 FINAL  Final  Urine Culture     Status: None   Collection Time: 11/04/20  5:50 AM   Specimen: Urine, Clean Catch  Result Value Ref Range Status   Specimen Description URINE, CLEAN CATCH  Final   Special Requests NONE  Final    Culture   Final    NO GROWTH Performed at [facility], [address], [place], [place] [address]    Report Status 11/04/2020 FINAL  Final  Culture, blood (routine x 2)     Status: None (Preliminary result)   Collection Time: 11/04/20  7:51 AM   Specimen: BLOOD LEFT HAND  Result Value Ref Range Status   Specimen Description BLOOD LEFT HAND  Final   Special Requests   Final    BOTTLES DRAWN AEROBIC AND ANAEROBIC Blood Culture adequate volume   Culture   Final    NO GROWTH 1 DAY Performed at [facility], [address] [place], [place] [address]    Report Status PENDING  Incomplete  Culture, blood (routine x 2)     Status: None (Preliminary result)   Collection Time: 11/04/20  8:00 AM   Specimen: BLOOD RIGHT HAND  Result Value Ref Range Status   Specimen Description BLOOD RIGHT HAND  Final   Special Requests   Final    BOTTLES DRAWN AEROBIC AND ANAEROBIC Blood Culture adequate volume   Culture   Final    NO GROWTH 1 DAY Performed at [facility], [address] [place], [place] [address]    Report Status PENDING  Incomplete    Pertinent Lab. CBC Latest Ref Rng & Units 11/04/2020 11/03/2020 11/02/2020  WBC 4.0 - 10.5 K/uL 10.5 11.1(H) 13.2(H)  Hemoglobin 12.0 - 15.0 g/dL 10.1(L) 9.8(L) 10.1(L)  Hematocrit 36.0 - 46.0 % 31.0(L) 30.4(L) 32.0(L)  Platelets 150 - 400 K/uL 320 293 231   CMP Latest Ref Rng & Units 11/05/2020 11/04/2020 11/03/2020  Glucose 70 - 99 mg/dL - 903(E) 90  BUN 8 - 23 mg/dL - 8 9  Creatinine 0.92 - 1.00 mg/dL 3.30 0.76 2.26(J)  Sodium 135 - 145 mmol/L - 135 133(L)  Potassium 3.5 - 5.1 mmol/L - 3.7 2.9(L)  Chloride 98 - 111 mmol/L - 108 103  CO2 22 - 32 mmol/L - 18(L) 19(L)  Calcium 8.9 - 10.3 mg/dL - 8.2(L) 8.1(L)  Total Protein 6.5 - 8.1 g/dL - - -  Total Bilirubin 0.3 - 1.2 mg/dL - - -  Alkaline Phos 38 - 126 U/L - - -  AST 15 - 41 U/L - - -  ALT 0 - 44 U/L - - -     Pertinent Imaging today Plain films and CT images have  been personally visualized and interpreted; radiology reports have been reviewed. Decision making incorporated into the Impression / Recommendations.  Chest Xray 11/02/2020 FINDINGS: Single frontal view of the chest was obtained with the patient rotated to the left. The cardiac silhouette is unremarkable. No acute airspace disease, effusion, or pneumothorax. No acute bony abnormalities.   IMPRESSION: 1. No acute intrathoracic process.   CT head WO contrast 11/03/2020 FINDINGS: Brain: There is no mass, hemorrhage or extra-axial collection. The size and configuration of the ventricles and extra-axial CSF spaces are normal. There is hypoattenuation of the white matter, most commonly indicating chronic small vessel disease.   Vascular: No abnormal hyperdensity of the major intracranial arteries or dural venous sinuses. No intracranial atherosclerosis.   Skull: The visualized skull base, calvarium and extracranial soft tissues are normal.   Sinuses/Orbits: No fluid levels or advanced mucosal thickening of the visualized paranasal sinuses. No mastoid or middle ear effusion. The orbits are normal.   IMPRESSION: Chronic small vessel disease without acute intracranial abnormality.  TTE 11/01/2020 Left ventricular ejection fraction, by estimation, is 60 to 65%. The left ventricle has normal function. The left ventricle has no regional wall motion abnormalities. Left ventricular diastolic function could not be evaluated. 1. Right ventricular systolic function is normal. The right ventricular size is normal. There is mildly elevated pulmonary artery systolic pressure. 2. 3. Left atrial size was mildly dilated. 4. The mitral valve is normal in structure. Trivial mitral valve regurgitation. The aortic valve is normal in structure. Aortic valve regurgitation is mild. Mild aortic valve sclerosis is present, with no evidence of aortic valve stenosis. 5. The inferior vena cava is dilated in  size with >50% respiratory variability, suggesting right atrial pressure of 8 mmHg. 6. Conclusion(s)/Recommendation(s): No evidence of valvular vegetations on this transthoracic echocardiogram. Would recommend a transesophageal echocardiogram to exclude infective endocarditis if clinically indicated.  I spent more than 35 minutes for this patient encounter including review of prior medical records, coordination of care  with greater than 50% of time being face to face/counseling and discussing diagnostics/treatment plan with the patient/family.  Electronically signed by:   Odette Fraction, MD Infectious Disease Physician [facility] Infectious Disease Pager: [phone]

## 2020-11-05 NOTE — Progress Notes (Signed)
Lower extremity venous has been completed.   Preliminary results in CV Proc.   Aundra Millet Chrisy Hillebrand 11/05/2020 11:53 AM

## 2020-11-05 NOTE — Progress Notes (Signed)
Occupational Therapy Treatment Patient Details Name: Andrea Yu MRN: [id number] DOB: [birth year] Today's Date: 11/05/2020   History of present illness Pt is an 80 y.o. female admitted 10/29/20 with confusion, decreased oral intake, urinary frequency; of note, pt with recent UTI. Workup for sepsis secondary to UTI. PMH includes PAF, neuropathy, anemia, SAH, HTN, obesity.   OT comments  Patient received in bed and agreeable to OT treatment.  Patient seen to address grooming seated on eob and simulated toilet transfers using recliner. Patient was mod assist to get to eob and min guard for balance during grooming. Patient transferred to to recliner positioned on left with squat pivot transfer and mod assist. Patient required extra time before transfer back to eob. Patient stated she too tired to perform transfers again and returned to supine. Acute OT to continue to follow.    Recommendations for follow up therapy are one component of a multi-disciplinary discharge planning process, led by the attending physician.  Recommendations may be updated based on patient status, additional functional criteria and insurance authorization.    Follow Up Recommendations  SNF;Supervision/Assistance - 24 hour    Equipment Recommendations  Other (comment)    Recommendations for Other Services      Precautions / Restrictions Precautions Precautions: Fall Precaution Comments: Watch HR (tachy) Restrictions Weight Bearing Restrictions: No       Mobility Bed Mobility Overal bed mobility: Needs Assistance Bed Mobility: Supine to Sit;Sit to Supine     Supine to sit: Mod assist;HOB elevated Sit to supine: Mod assist   General bed mobility comments: required assistance with BLEs and trunk    Transfers Overall transfer level: Needs assistance Equipment used: 1 person hand held assist Transfers: Sit to/from [employer] Transfers Sit to Stand: Mod assist   Squat pivot transfers: Mod assist      General transfer comment: performed transfers from eob to recliner and back to eob to simulate tiolet transfers    Balance Overall balance assessment: Needs assistance Sitting-balance support: No upper extremity supported;Feet supported Sitting balance-Leahy Scale: Fair Sitting balance - Comments: min guard when performing grooming                                   ADL either performed or assessed with clinical judgement   ADL       Grooming: Wash/dry hands;Wash/dry face;Supervision/safety;Sitting Grooming Details (indicate cue type and reason): able to wash face and hands with wash cloth sitting on eob                               General ADL Comments: simutated toilet transfers using recliner with mod assist     Vision       Perception     Praxis      Cognition Arousal/Alertness: Awake/alert Behavior During Therapy: WFL for tasks assessed/performed Overall Cognitive Status: No family/caregiver present to determine baseline cognitive functioning                                 General Comments: followed commands with good orientation        Exercises     Shoulder Instructions       General Comments      Pertinent Vitals/ Pain       Pain Assessment: Faces Faces Pain Scale:  Hurts a little bit Pain Location: left side Pain Descriptors / Indicators: Aching;Grimacing Pain Intervention(s): Repositioned;Monitored during session  Home Living                                          Prior Functioning/Environment              Frequency  Min 2X/week        Progress Toward Goals  OT Goals(current goals can now be found in the care plan section)  Progress towards OT goals: Progressing toward goals  Acute Rehab OT Goals Patient Stated Goal: I want to go home OT Goal Formulation: With patient Time For Goal Achievement: 11/14/20 Potential to Achieve Goals: Fair ADL Goals Pt Will Perform  Eating: with set-up;sitting Pt Will Perform Grooming: with set-up;sitting Pt Will Perform Upper Body Bathing: with mod assist;sitting Pt Will Transfer to Toilet: with mod assist;stand pivot transfer;bedside commode Additional ADL Goal #1: Pt to complete bed mobility at Mod A in prep for ADL transfers Additional ADL Goal #2: Pt to demo ability to sit EOB >5-7 min during functional tasks with no more than min guard assist  Plan Discharge plan remains appropriate    Co-evaluation                 AM-PAC OT "6 Clicks" Daily Activity     Outcome Measure   Help from another person eating meals?: A Lot Help from another person taking care of personal grooming?: A Lot Help from another person toileting, which includes using toliet, bedpan, or urinal?: Total Help from another person bathing (including washing, rinsing, drying)?: Total Help from another person to put on and taking off regular upper body clothing?: Total Help from another person to put on and taking off regular lower body clothing?: Total 6 Click Score: 8    End of Session Equipment Utilized During Treatment: Gait belt  OT Visit Diagnosis: Other abnormalities of gait and mobility (R26.89);Unsteadiness on feet (R26.81);Muscle weakness (generalized) (M62.81);Other symptoms and signs involving cognitive function   Activity Tolerance Patient limited by fatigue   Patient Left in bed;with call bell/phone within reach;with bed alarm set;with nursing/sitter in room   Nurse Communication Mobility status        Time: 2671-2458 OT Time Calculation (min): 33 min  Charges: OT General Charges $OT Visit: 1 Visit OT Treatments $Self Care/Home Management : 23-37 mins  Alfonse Flavors, OTA   Taunya Goral Jeannett Senior 11/05/2020, 11:19 AM

## 2020-11-05 NOTE — Progress Notes (Addendum)
Temp down after Ibuprofen given 98.8.

## 2020-11-05 NOTE — Progress Notes (Signed)
   11/04/20 2022  Vitals  Temp (!) 103.2 F (39.6 C)  Temp Source Oral  BP (!) 134/50  MAP (mmHg) 75  BP Location Right Arm  BP Method Automatic  Patient Position (if appropriate) Lying  Pulse Rate (!) 108  Pulse Rate Source Monitor  ECG Heart Rate (!) 108  Resp 20  Level of Consciousness  Level of Consciousness Alert  MEWS COLOR  MEWS Score Color Yellow  Oxygen Therapy  SpO2 97 %  O2 Device Room Air  Patient Activity (if Appropriate) In bed  Pain Assessment  Pain Scale 0-10  Pain Score 0  MEWS Score  MEWS Temp 2  MEWS Systolic 0  MEWS Pulse 1  MEWS RR 0  MEWS LOC 0  MEWS Score 3  Provider Notification  Provider Name/Title Chotiner  Date Provider Notified 11/04/20  Time Provider Notified 2024  Notification Type Page  Notification Reason Change in status  Provider response See new orders  Date of Provider Response 11/04/20  Time of Provider Response 2030  Dr. Rachael Darby text page about Temp 103.2 and that patient had Tylenol at approx.18:30 tonight.  See new orders.

## 2020-11-05 NOTE — Progress Notes (Signed)
Pharmacy Antibiotic Note  Andrea Yu is a 80 y.o. female admitted on 10/29/2020 with bacteremia.  Pharmacy has been consulted for Vancomycin dosing.  Ms. Harnden has Streptococcal Parasanguinis bacteremia. Was on Ceftriaxone for 6 days prior, now with a concern for drug-induced fevers. Switch to Vancomycin per ID and monitoring the fever curve. Additional sensitivities have been requested form micro lab.   Plan: Start Vancomycin 1750 mg IV x 1 dose, followed by Vancomycin 750 mg IV q24h (eAUC 542, Scr 0.91) Goal AUC 400-550 Monitor renal function, micro data, and clinical status  Height: 4\' 11"  (149.9 cm) Weight: 86.9 kg (191 lb 9.3 oz) IBW/kg (Calculated) : 43.2  Temp (24hrs), Avg:98.8 F (37.1 C), Min:97.6 F (36.4 C), Max:103.2 F (39.6 C)  Recent Labs  Lab 10/29/20 1428 10/29/20 1629 10/29/20 1710 10/29/20 1726 10/31/20 0159 11/01/20 0753 11/02/20 0746 11/03/20 0907 11/04/20 0331 11/05/20 0457  WBC 15.3*  --   --    < > 16.0* 14.7* 13.2* 11.1* 10.5  --   CREATININE 1.22*   < >  --    < > 0.91 0.96 1.09* 1.02* 0.95 0.91  LATICACIDVEN 1.0  --  1.8  --   --   --   --   --   --   --    < > = values in this interval not displayed.    Estimated Creatinine Clearance: 46.5 mL/min (by C-G formula based on SCr of 0.91 mg/dL).    Allergies  Allergen Reactions   Penicillins Anaphylaxis    THROAT SWELLING  Has patient had a PCN reaction causing immediate rash, facial/tongue/throat swelling, SOB or lightheadedness with hypotension:  # # YES # #  Has patient had a PCN reaction causing severe rash involving mucus membranes or skin necrosis: # # # YES # # # Has patient had a PCN reaction that required hospitalization:No Has patient had a PCN reaction occurring within the last 10 years:  # # YES # #  If all of the above answers are "NO", then may proceed with Cephalosporin use.    Prolia [Denosumab] Other (See Comments)    Severe Pain in the groin area.   Ceftriaxone      Possible cause of neutropenia   Ciprofloxacin Hcl Other (See Comments)   Fosamax [Alendronate Sodium]     UNSPECIFIED REACTION    Latex Swelling   Lisinopril Cough   Tubersol [Tuberculin Ppd] Other (See Comments)    Unknown     Antimicrobials this admission: Ceftriaxone  10/5 >> 10/11 Vancomycin 10/12 >>   Dose adjustments this admission: None  Microbiology results: 10/5 BCx: Strep Parasanguinis   Thank you for allowing pharmacy to be a part of this patient's care.  12/12, PharmD PGY2 Infectious Diseases Pharmacy Resident   Please check [email or web address] for unit-specific pharmacy phone numbers

## 2020-11-05 NOTE — Progress Notes (Signed)
Progress Note  Patient Name: Andrea Yu Date of Encounter: 11/05/2020  [facility] Cardiologist: Thurmon Fair, MD   Subjective   Much more alert today. Continues to spike fevers daily despite antibiotics. WBC better. No recent dental procedures.  Inpatient Medications    Scheduled Meds:  aspirin EC  81 mg Oral QPM   diltiazem  30 mg Oral Q6H   docusate sodium  100 mg Oral BID   DULoxetine  90 mg Oral Daily   enoxaparin (LOVENOX) injection  40 mg Subcutaneous Q24H   ipratropium-albuterol  3 mL Nebulization Q6H   metoprolol succinate  50 mg Oral Daily   nystatin  5 mL Oral QID   pantoprazole  40 mg Oral Daily   Continuous Infusions:  vancomycin 1,750 mg (11/05/20 1121)   [START ON 11/06/2020] vancomycin     PRN Meds: acetaminophen **OR** acetaminophen, HYDROcodone-acetaminophen, ibuprofen, ondansetron **OR** ondansetron (ZOFRAN) IV   Vital Signs    Vitals:   11/05/20 0412 11/05/20 0606 11/05/20 0823 11/05/20 0825  BP: (!) 94/51 (!) 92/58 (!) 115/56 (!) 113/57  Pulse: 79 75 80 81  Resp: 20 20 20 20   Temp: 97.7 F (36.5 C) 97.6 F (36.4 C)  97.9 F (36.6 C)  TempSrc: Oral Oral  Oral  SpO2: 100% 100% 100% 100%  Weight:      Height:        Intake/Output Summary (Last 24 hours) at 11/05/2020 1121 Last data filed at 11/05/2020 0454 Gross per 24 hour  Intake 600 ml  Output 300 ml  Net 300 ml   Last 3 Weights 11/04/2020 10/30/2020 12/13/2019  Weight (lbs) 191 lb 9.3 oz 182 lb 15.7 oz 183 lb  Weight (kg) 86.9 kg 83 kg 83.008 kg      Telemetry    AFib, rate controlled - Personally Reviewed  ECG    No new tracing - Personally Reviewed  Physical Exam  More alert, seems to be oriented GEN: No acute distress.   Neck: No JVD Cardiac: irregular, 1/6 systolic murmur RUSB, no diastolic murmurs, rubs, or gallops.  Respiratory: Clear to auscultation bilaterally. GI: Soft, nontender, non-distended  MS: No edema; No deformity. Neuro:  Nonfocal  Psych:  Normal affect   Labs    High Sensitivity Troponin:  No results for input(s): TROPONINIHS in the last 720 hours.   Chemistry Recent Labs  Lab 10/29/20 1428 10/29/20 1629 10/30/20 0558 10/31/20 0159 11/02/20 0746 11/03/20 0907 11/04/20 0331 11/05/20 0457  NA 133*   < > 133*   < > 130* 133* 135  --   K 5.1   < > 3.2*   < > 3.5 2.9* 3.7  --   CL 103   < > 100   < > 101 103 108  --   CO2 18*  --  20*   < > 18* 19* 18*  --   GLUCOSE 105*   < > 82   < > 77 90 112*  --   BUN 24*   < > 14   < > 7* 9 8  --   CREATININE 1.22*   < > 1.06*   < > 1.09* 1.02* 0.95 0.91  CALCIUM 9.0  --  8.3*   < > 8.1* 8.1* 8.2*  --   MG  --   --   --   --   --  1.7  --   --   PROT 6.7  --  5.2*  --   --   --   --  5.4*  ALBUMIN 2.9*  --  2.4*  --   --   --   --  2.0*  AST 22  --  21  --   --   --   --  20  ALT 19  --  18  --   --   --   --  16  ALKPHOS 76  --  61  --   --   --   --  70  BILITOT 0.9  --  1.0  --   --   --   --  0.6  GFRNONAA 45*   < > 53*   < > 51* 55* >60 >60  ANIONGAP 12  --  13   < > 11 11 9   --    < > = values in this interval not displayed.    Lipids No results for input(s): CHOL, TRIG, HDL, LABVLDL, LDLCALC, CHOLHDL in the last 168 hours.  Hematology Recent Labs  Lab 11/02/20 0746 11/03/20 0907 11/04/20 0331  WBC 13.2* 11.1* 10.5  RBC 3.62* 3.57* 3.67*  HGB 10.1* 9.8* 10.1*  HCT 32.0* 30.4* 31.0*  MCV 88.4 85.2 84.5  MCH 27.9 27.5 27.5  MCHC 31.6 32.2 32.6  RDW 15.0 15.0 14.7  PLT 231 293 320   Thyroid  Recent Labs  Lab 11/03/20 0907  TSH 3.168    BNPNo results for input(s): BNP, PROBNP in the last 168 hours.  DDimer No results for input(s): DDIMER in the last 168 hours.   Radiology    01/03/21 RENAL  Result Date: 11/04/2020 CLINICAL DATA:  Hypertension, pyelonephritis. EXAM: RENAL / URINARY TRACT ULTRASOUND COMPLETE COMPARISON:  CT scan 10/29/2020 FINDINGS: Right Kidney: Renal measurements: 13.0 by 4.7 by 5.9 cm = volume: 167 mL. Echogenicity within normal limits.  No solid mass or hydronephrosis visualized. Indistinct posterior 1.4 by 0.8 cm exophytic lesion from the right kidney upper pole posteriorly corresponding to the small cyst seen on the CT of 10/29/2020. Left Kidney: Renal measurements: 12.4 by 5.1 by 5.0 cm = volume: 164 mL. Echogenicity within normal limits. No mass or hydronephrosis visualized. 0.8 cm cyst of the upper pole on image 22 series 1-1. Bladder: Appears normal for degree of bladder distention. Other: None. IMPRESSION: 1. Single tiny bilateral upper pole cysts. 2. No evidence of hydronephrosis or hydroureter. No additional significant renal parenchymal findings. Please note that pyelonephritis can be occult on sonography. No renal abscess is identified. Electronically Signed   By: 12/29/2020 M.D.   On: 11/04/2020 14:06    Cardiac Studies   Echo as above  Patient Profile     80 y.o. female with atrial fibrillation, essential hypertension, psoriatic arthritis, history of neuropathy with orthostatic hypotension and frequent falls presenting with acute febrile illness, encephalopathy initially felt to be urosepsis, but subsequently demonstrated to have Streptococcus parasanguinis bacteremia.  Assessment & Plan    Improving slowly, still spiking fevers after 6 days of antibiotics - this would be typical for SBE. For TEE for possible strep viridans endocarditis on Friday (first available). AFib well rate controlled. Not on anticoagulation due to frequent falls and injuries, including intracranial hemorrhage.     For questions or updates, please contact [facility] Please consult [email or web address] for contact info under        Signed, Monday, MD  11/05/2020, 11:21 AM

## 2020-11-06 ENCOUNTER — Inpatient Hospital Stay (HOSPITAL_COMMUNITY): Payer: Medicare Other

## 2020-11-06 DIAGNOSIS — G9341 Metabolic encephalopathy: Secondary | ICD-10-CM | POA: Diagnosis not present

## 2020-11-06 DIAGNOSIS — A419 Sepsis, unspecified organism: Secondary | ICD-10-CM | POA: Diagnosis not present

## 2020-11-06 DIAGNOSIS — R7881 Bacteremia: Secondary | ICD-10-CM | POA: Diagnosis not present

## 2020-11-06 DIAGNOSIS — R6520 Severe sepsis without septic shock: Secondary | ICD-10-CM | POA: Diagnosis not present

## 2020-11-06 DIAGNOSIS — I4821 Permanent atrial fibrillation: Secondary | ICD-10-CM | POA: Diagnosis not present

## 2020-11-06 LAB — CBC
HCT: 33.2 % — ABNORMAL LOW (ref 36.0–46.0)
Hemoglobin: 10.8 g/dL — ABNORMAL LOW (ref 12.0–15.0)
MCH: 28 pg (ref 26.0–34.0)
MCHC: 32.5 g/dL (ref 30.0–36.0)
MCV: 86 fL (ref 80.0–100.0)
Platelets: 314 10*3/uL (ref 150–400)
RBC: 3.86 MIL/uL — ABNORMAL LOW (ref 3.87–5.11)
RDW: 15.5 % (ref 11.5–15.5)
WBC: 10.3 10*3/uL (ref 4.0–10.5)
nRBC: 0 % (ref 0.0–0.2)

## 2020-11-06 LAB — BASIC METABOLIC PANEL
Anion gap: 9 (ref 5–15)
BUN: 8 mg/dL (ref 8–23)
CO2: 18 mmol/L — ABNORMAL LOW (ref 22–32)
Calcium: 8.4 mg/dL — ABNORMAL LOW (ref 8.9–10.3)
Chloride: 107 mmol/L (ref 98–111)
Creatinine, Ser: 0.91 mg/dL (ref 0.44–1.00)
GFR, Estimated: >60 mL/min (ref >60)
Glucose, Bld: 87 mg/dL (ref 70–99)
Potassium: 3.3 mmol/L — ABNORMAL LOW (ref 3.5–5.1)
Sodium: 134 mmol/L — ABNORMAL LOW (ref 135–145)

## 2020-11-06 LAB — BRAIN NATRIURETIC PEPTIDE: B Natriuretic Peptide: 471.6 pg/mL — ABNORMAL HIGH (ref 0.0–100.0)

## 2020-11-06 MED ORDER — DILTIAZEM HCL-DEXTROSE 125-5 MG/125ML-% IV SOLN (PREMIX)
5.0000 mg/h | INTRAVENOUS | Status: DC
  Administered 2020-11-06: 5 mg/h via INTRAVENOUS
  Administered 2020-11-07: 10 mg/h via INTRAVENOUS
  Filled 2020-11-06 (×2): qty 125

## 2020-11-06 MED ORDER — DILTIAZEM LOAD VIA INFUSION
10.0000 mg | Freq: Once | INTRAVENOUS | Status: AC
  Administered 2020-11-06: 10 mg via INTRAVENOUS
  Filled 2020-11-06: qty 10

## 2020-11-06 MED ORDER — METOPROLOL TARTRATE 5 MG/5ML IV SOLN
5.0000 mg | Freq: Three times a day (TID) | INTRAVENOUS | Status: DC | PRN
  Administered 2020-11-06 – 2020-11-14 (×3): 5 mg via INTRAVENOUS
  Filled 2020-11-06 (×3): qty 5

## 2020-11-06 MED ORDER — POTASSIUM CHLORIDE 10 MEQ/100ML IV SOLN
10.0000 meq | INTRAVENOUS | Status: AC
  Administered 2020-11-06 (×2): 10 meq via INTRAVENOUS
  Filled 2020-11-06 (×2): qty 100

## 2020-11-06 NOTE — Care Management Important Message (Signed)
Important Message  Patient Details  Name: Andrea Yu MRN: [id number] Date of Birth: 1940-02-02   Medicare Important Message Given:  Yes     Renie Ora 11/06/2020, 9:50 AM

## 2020-11-06 NOTE — Progress Notes (Signed)
Speech Language Pathology Treatment: Dysphagia  Patient Details Name: Andrea Yu MRN: [id number] DOB: 11-Feb-1940 Today's Date: 11/06/2020 Time: 1884-1660 SLP Time Calculation (min) (ACUTE ONLY): 10 min  Assessment / Plan / Recommendation Clinical Impression  Pt seen at bedside to assess tolerance of dys3/thin liquid diet. RN reports pt spiked a fever of 103 this morning. Pt initially willing to eat some lunch (pot roast, mashed potatoes, peach cobbler), but ended up taking only one bite of each. Extended oral prep noted on the meat. Pt utilized milk to facilitate oral clearing. She drank the entire carton of milk via straw, and did not exhibit any overt s/s aspiration.  Dys3 diet may be too advanced for patient, given lack of full dentition. Full supervision is recommended, so staff will be able to cut solids into manageable pieces. SLP will continue to follow to assess tolerance of current diet, and determine if downgrading is the most appropriate option at this time.    HPI HPI: Pt is an 80 y.o. female admitted 10/29/20 with confusion, decreased oral intake, urinary frequency. Ct head negative. Pt declined MRI. Dx sepsis secondary to UTI, acute metabolic encephalopathy. Abdominal CT with questionable R gluteal abscess. CXR 10/5 negative for acute changes. PMH: PAF, neuropathy, anemia, SAH, HTN, obesity, GERD.      SLP Plan  Continue with current plan of care      Recommendations for follow up therapy are one component of a multi-disciplinary discharge planning process, led by the attending physician.  Recommendations may be updated based on patient status, additional functional criteria and insurance authorization.    Recommendations  Diet recommendations: Dysphagia 3 (mechanical soft);Thin liquid Liquids provided via: Straw;Cup Medication Administration: Whole meds with puree Supervision: Full supervision/cueing for compensatory strategies Compensations: Slow rate;Small  sips/bites;Minimize environmental distractions Postural Changes and/or Swallow Maneuvers: Seated upright 90 degrees;Upright 30-60 min after meal                Oral Care Recommendations: Oral care BID;Staff/trained caregiver to provide oral care Follow up Recommendations: Other (comment) (TBD) SLP Visit Diagnosis: Dysphagia, unspecified (R13.10) Plan: Continue with current plan of care       GO              Andrea Yu B. Andrea Yu, [facility], CCC-SLP Speech Language Pathologist Office: [phone]  Andrea Yu  11/06/2020, 1:30 PM

## 2020-11-06 NOTE — H&P (View-Only) (Signed)
Progress Note  Patient Name: Andrea Yu Date of Encounter: 11/06/2020  [facility] Cardiologist: Thurmon Fair, MD   Subjective   First 24 hours without true fever, T-max 99.7 F. Yesterday was well rate controlled, today is markedly tachycardic with RVR to the 130s-140s.  Appears tachypneic.  Seems to deny dyspnea with a shake of her head. Yesterday, she was alert and appeared oriented and was quite communicative.  Today she is nonverbal, responding only with grunts and interjections, only following instructions inconsistently.  Inpatient Medications    Scheduled Meds:  aspirin EC  81 mg Oral QPM   diltiazem  30 mg Oral Q6H   docusate sodium  100 mg Oral BID   DULoxetine  90 mg Oral Daily   enoxaparin (LOVENOX) injection  40 mg Subcutaneous Q24H   metoprolol succinate  50 mg Oral Daily   nystatin  5 mL Oral QID   pantoprazole  40 mg Oral Daily   Continuous Infusions:  vancomycin     PRN Meds: acetaminophen **OR** acetaminophen, HYDROcodone-acetaminophen, ipratropium-albuterol, menthol-cetylpyridinium, ondansetron **OR** ondansetron (ZOFRAN) IV   Vital Signs    Vitals:   11/05/20 2016 11/05/20 2320 11/06/20 0317 11/06/20 0800  BP: 130/64 123/72 123/67 (!) 150/58  Pulse: 76 75 70 (!) 104  Resp: 18 19 18 20   Temp: 97.6 F (36.4 C) 97.6 F (36.4 C) 97.6 F (36.4 C) 99.7 F (37.6 C)  TempSrc: Oral Oral Oral Oral  SpO2: 100% 98% 98% 97%  Weight:      Height:       No intake or output data in the 24 hours ending 11/06/20 1013 Last 3 Weights 11/04/2020 10/30/2020 12/13/2019  Weight (lbs) 191 lb 9.3 oz 182 lb 15.7 oz 183 lb  Weight (kg) 86.9 kg 83 kg 83.008 kg      Telemetry    Atrial fibrillation, rate controlled- Personally Reviewed  ECG    No new tracing- Personally Reviewed  Physical Exam  Severely obese, appears uncomfortable, tachypneic GEN: No acute distress.   Neck: Hard to see, but appeared distended Cardiac: Irregular, faint systolic  ejection murmur in the aortic focus, no diastolic murmurs, rubs, or gallops.  Respiratory: Clear to auscultation bilaterally. GI: Soft, nontender, non-distended  MS: No edema; No deformity. Neuro:  Nonfocal  Psych: Normal affect   Labs    High Sensitivity Troponin:  No results for input(s): TROPONINIHS in the last 720 hours.   Chemistry Recent Labs  Lab 11/03/20 0907 11/04/20 0331 11/05/20 0457 11/06/20 0451  NA 133* 135  --  134*  K 2.9* 3.7  --  3.3*  CL 103 108  --  107  CO2 19* 18*  --  18*  GLUCOSE 90 112*  --  87  BUN 9 8  --  8  CREATININE 1.02* 0.95 0.91 0.91  CALCIUM 8.1* 8.2*  --  8.4*  MG 1.7  --   --   --   PROT  --   --  5.4*  --   ALBUMIN  --   --  2.0*  --   AST  --   --  20  --   ALT  --   --  16  --   ALKPHOS  --   --  70  --   BILITOT  --   --  0.6  --   GFRNONAA 55* >60 >60 >60  ANIONGAP 11 9  --  9    Lipids No results for input(s): CHOL,  TRIG, HDL, LABVLDL, LDLCALC, CHOLHDL in the last 168 hours.  Hematology Recent Labs  Lab 11/03/20 0907 11/04/20 0331 11/06/20 0451  WBC 11.1* 10.5 10.3  RBC 3.57* 3.67* 3.86*  HGB 9.8* 10.1* 10.8*  HCT 30.4* 31.0* 33.2*  MCV 85.2 84.5 86.0  MCH 27.5 27.5 28.0  MCHC 32.2 32.6 32.5  RDW 15.0 14.7 15.5  PLT 293 320 314   Thyroid  Recent Labs  Lab 11/03/20 0907  TSH 3.168    BNPNo results for input(s): BNP, PROBNP in the last 168 hours.  DDimer No results for input(s): DDIMER in the last 168 hours.   Radiology    US RENAL  Result Date: 11/04/2020 CLINICAL DATA:  Hypertension, pyelonephritis. EXAM: RENAL / URINARY TRACT ULTRASOUND COMPLETE COMPARISON:  CT scan 10/29/2020 FINDINGS: Right Kidney: Renal measurements: 13.0 by 4.7 by 5.9 cm = volume: 167 mL. Echogenicity within normal limits. No solid mass or hydronephrosis visualized. Indistinct posterior 1.4 by 0.8 cm exophytic lesion from the right kidney upper pole posteriorly corresponding to the small cyst seen on the CT of 10/29/2020. Left Kidney:  Renal measurements: 12.4 by 5.1 by 5.0 cm = volume: 164 mL. Echogenicity within normal limits. No mass or hydronephrosis visualized. 0.8 cm cyst of the upper pole on image 22 series 1-1. Bladder: Appears normal for degree of bladder distention. Other: None. IMPRESSION: 1. Single tiny bilateral upper pole cysts. 2. No evidence of hydronephrosis or hydroureter. No additional significant renal parenchymal findings. Please note that pyelonephritis can be occult on sonography. No renal abscess is identified. Electronically Signed   By: Gaylyn Rong M.D.   On: 11/04/2020 14:06   VAS [place] LOWER EXTREMITY VENOUS (DVT)  Result Date: 11/05/2020  Lower Venous DVT Study Patient Name:  CARMESHA MOROCCO  Date of Exam:   11/05/2020 Medical Rec #: [id number]      Accession #:    [id number] Date of Birth: Jul 25, 1940     Patient Gender: F Patient Age:   80 years Exam Location:  [facility] Procedure:      VAS [place] LOWER EXTREMITY VENOUS (DVT) Referring Phys: Odette Fraction --------------------------------------------------------------------------------  Indications: Bacteremia.  Limitations: Patient movement and positioning. Comparison Study: no prior Performing Technologist: [place] Ponder RVS  Examination Guidelines: A complete evaluation includes B-mode imaging, spectral Doppler, color Doppler, and power Doppler as needed of all accessible portions of each vessel. Bilateral testing is considered an integral part of a complete examination. Limited examinations for reoccurring indications may be performed as noted. The reflux portion of the exam is performed with the patient in reverse Trendelenburg.  +---------+---------------+---------+-----------+----------+-------------------+ RIGHT    CompressibilityPhasicitySpontaneityPropertiesThrombus Aging      +---------+---------------+---------+-----------+----------+-------------------+ CFV      Full           Yes      Yes                                       +---------+---------------+---------+-----------+----------+-------------------+ SFJ      Full                                                             +---------+---------------+---------+-----------+----------+-------------------+ FV Prox  Full                                                             +---------+---------------+---------+-----------+----------+-------------------+  FV Mid   Full                                                             +---------+---------------+---------+-----------+----------+-------------------+ FV DistalFull                                                             +---------+---------------+---------+-----------+----------+-------------------+ PFV      Full                                                             +---------+---------------+---------+-----------+----------+-------------------+ POP      Full           Yes      Yes                                      +---------+---------------+---------+-----------+----------+-------------------+ PTV      Full                                                             +---------+---------------+---------+-----------+----------+-------------------+ PERO                                                  Not well visualized +---------+---------------+---------+-----------+----------+-------------------+   +---------+---------------+---------+-----------+----------+-------------------+ LEFT     CompressibilityPhasicitySpontaneityPropertiesThrombus Aging      +---------+---------------+---------+-----------+----------+-------------------+ CFV      Full           Yes      Yes                                      +---------+---------------+---------+-----------+----------+-------------------+ SFJ      Full                                                             +---------+---------------+---------+-----------+----------+-------------------+ FV  Prox  Full                                                             +---------+---------------+---------+-----------+----------+-------------------+ FV Mid   Full                                                             +---------+---------------+---------+-----------+----------+-------------------+  FV DistalFull                                                             +---------+---------------+---------+-----------+----------+-------------------+ PFV      Full                                                             +---------+---------------+---------+-----------+----------+-------------------+ POP      Full           Yes      Yes                                      +---------+---------------+---------+-----------+----------+-------------------+ PTV                                                   Not well visualized +---------+---------------+---------+-----------+----------+-------------------+ PERO                                                  Not well visualized +---------+---------------+---------+-----------+----------+-------------------+     Summary: RIGHT: - There is no evidence of deep vein thrombosis in the lower extremity.  - No cystic structure found in the popliteal fossa.  LEFT: - There is no evidence of deep vein thrombosis in the lower extremity. However, portions of this examination were limited- see technologist comments above.  - No cystic structure found in the popliteal fossa.  *See table(s) above for measurements and observations. Electronically signed by Coral Else MD on 11/05/2020 at 9:28:13 PM.    Final     Cardiac Studies   Transthoracic echocardiogram 11/01/2020   1. Left ventricular ejection fraction, by estimation, is 60 to 65%. The  left ventricle has normal function. The left ventricle has no regional  wall motion abnormalities. Left ventricular diastolic function could not  be evaluated.   2. Right  ventricular systolic function is normal. The right ventricular  size is normal. There is mildly elevated pulmonary artery systolic  pressure.   3. Left atrial size was mildly dilated.   4. The mitral valve is normal in structure. Trivial mitral valve  regurgitation.   5. The aortic valve is normal in structure. Aortic valve regurgitation is  mild. Mild aortic valve sclerosis is present, with no evidence of aortic  valve stenosis.   6. The inferior vena cava is dilated in size with >50% respiratory  variability, suggesting right atrial pressure of 8 mmHg.   Patient Profile     80 y.o. female with atrial fibrillation, essential hypertension, psoriatic arthritis, history of neuropathy with orthostatic hypotension and frequent falls presenting with acute febrile illness, encephalopathy initially felt to be urosepsis, but subsequently demonstrated to have Streptococcus parasanguinis bacteremia (albeit only 1 of 4 culture bottles)  Assessment & Plan  Encephalopathy: This appears to have cleared yesterday, but has returned.  Only follows certain commands, but do not identify any clear evidence of focal neurological event.  Consider head CT since she is not on anticoagulation. Sepsis: Fever finally tapering off.  WBC at upper limit of normal.  Initial impression was that she has urosepsis, but slow response to antibiotics and the one positive blood culture for strep viridans raises concern for endocarditis.  Scheduled for transesophageal echocardiogram tomorrow 1300h.  If no vegetations are seen and subsequent drawn blood cultures remain negative, may not need prolonged treatment for endocarditis. AFib: Well rate controlled until last evening, now with RVR despite receiving medications (she received her 6 AM dose of diltiazem, but has not yet received her daily dose of metoprolol succinate).  Not on anticoagulation due to history of recurrent falls and intracranial hemorrhage.  We will switch to  intravenous diltiazem for better rate control.  Check chest x-ray.  Check BNP.  Lower extremity venous Doppler negative for clot yesterday.  Hypokalemia: Mild, not sure we can get her to cooperate with oral medications we will switch to intravenous supplement.      For questions or updates, please contact [facility] Please consult [email or web address] for contact info under        Signed, Thurmon Fair, MD  11/06/2020, 10:13 AM

## 2020-11-06 NOTE — Consult Note (Signed)
Neurology Consultation  Reason for Consult: Acute encephalopathy with decreased responsiveness Referring Physician: Dr. Natale Milch  CC: Patient does not verbalize a complaint  History is obtained from: Chart review, bedside RN, unable to obtain from patient due to altered mental status.   HPI: Andrea Yu is a 80 y.o. female with a medical history significant for atrial fibrillation not on anticoagulation due to a history of falls with a traumatic subarachnoid hemorrhage (2016), psoriatic arthritis, essential hypertension, and peripheral neuropathy who presented to the ED initially on 10/5 for evaluation of confusion, generalized weakness, poor intake, and flank pain with increased urinary frequency s/p Bactrim treatment for UTI. She was found to have sepsis with tachycardia, hypotension, a leukocytosis WBC of 15.3, UA with moderate leukocytes, and blood cultures with streptococcal parasanguinis bacteremia. Patient has been confused with a waxing and waning mental status with varying degrees of communication and ability to follow commands. Chart review supports that patient had some clearing of her mental status yesterday with recurrence today where she was noted to have decreased verbalization and was not following commands. Due to recent atrial fibrillation with RVR not on anticoagulation a Code Stroke was activated for evaluation of stroke as etiology of change in mental status.   LKW: Prior to hospitalization on 10/5 TNK given?: no, patient is well outside of the window for thrombolytic therapy with last known well prior to hospitalization one week ago.  IR Thrombectomy? No, presentation is not consistent with LVO. Modified Rankin Scale: 4-Needs assistance to walk and tend to bodily needs  ROS: Unable to obtain due to altered mental status.   Past Medical History:  Diagnosis Date   A-fib [facility])    "just after knee 2nd OR" (11/07/2012)   Anemia    Arthritis    RA , SACROTIC ARTHRITIS    Complication of anesthesia    found to be in afib when she woke up   Dysrhythmia    GERD (gastroesophageal reflux disease)    Head injury, closed, with concussion    History of blood transfusion    "after 1 of my knee ORs and today" (11/07/2012)   Hypertension    Peripheral neuropathy    "from back OR in 2005; in hands and feet" (11/07/2012)   Psoriatic arthritis (HCC)    on Prednisone Rx   Past Surgical History:  Procedure Laterality Date   ANKLE FUSION Right 01/07/2016   Procedure: Right Tibiocalcaneal Fusion;  Surgeon: Nadara Mustard, MD;  Location: [facility] OR;  Service: Orthopedics;  Laterality: Right;   BACK SURGERY     CATARACT EXTRACTION W/ INTRAOCULAR LENS  IMPLANT, BILATERAL Bilateral 2007   CHOLECYSTECTOMY  2000's   COLONOSCOPY N/A 11/10/2012   Procedure: COLONOSCOPY;  Surgeon: Theda Belfast, MD;  Location: [facility] ENDOSCOPY;  Service: Endoscopy;  Laterality: N/A;   ESOPHAGOGASTRODUODENOSCOPY N/A 11/10/2012   Procedure: ESOPHAGOGASTRODUODENOSCOPY (EGD);  Surgeon: Theda Belfast, MD;  Location: [facility] ENDOSCOPY;  Service: Endoscopy;  Laterality: N/A;   EYE SURGERY     JOINT REPLACEMENT     POSTERIOR LUMBAR FUSION  2005   "got a rod and 4 pins in there" (11/07/2012)   REPLACEMENT TOTAL KNEE BILATERAL Bilateral 2007   TRANSTHORACIC ECHOCARDIOGRAM  05/24/2006   TUBAL LIGATION  1974   VAGINAL HYSTERECTOMY  1983   Family History  Problem Relation Age of Onset   Heart attack Father        1951-deceased    Liver disease Mother    Aneurysm Son 38  Deceased    Social History:   reports that she quit smoking about 39 years ago. Her smoking use included cigarettes. She has a 15.00 pack-year smoking history. She has never used smokeless tobacco. She reports that she does not drink alcohol and does not use drugs.  Medications  Current Facility-Administered Medications:    acetaminophen (TYLENOL) tablet 650 mg, 650 mg, Oral, Q6H PRN, 650 mg at 11/05/20 2054 **OR** acetaminophen  (TYLENOL) suppository 650 mg, 650 mg, Rectal, Q6H PRN, Cipriano Bunker, MD   aspirin EC tablet 81 mg, 81 mg, Oral, QPM, Cipriano Bunker, MD, 81 mg at 11/05/20 1703   diltiazem (CARDIZEM) tablet 30 mg, 30 mg, Oral, Q6H, Bhagat, Bhavinkumar, PA, 30 mg at 11/06/20 0622   docusate sodium (COLACE) capsule 100 mg, 100 mg, Oral, BID, Cipriano Bunker, MD, 100 mg at 11/05/20 2055   DULoxetine (CYMBALTA) DR capsule 90 mg, 90 mg, Oral, Daily, Cipriano Bunker, MD, 90 mg at 11/05/20 0817   enoxaparin (LOVENOX) injection 40 mg, 40 mg, Subcutaneous, Q24H, Mosetta Anis, RPH, 40 mg at 11/05/20 1704   HYDROcodone-acetaminophen (NORCO/VICODIN) 5-325 MG per tablet 1 tablet, 1 tablet, Oral, Q6H PRN, Cipriano Bunker, MD, 1 tablet at 11/02/20 2008   ipratropium-albuterol (DUONEB) 0.5-2.5 (3) MG/3ML nebulizer solution 3 mL, 3 mL, Nebulization, Q6H PRN, Azucena Fallen, MD   menthol-cetylpyridinium (CEPACOL) lozenge 3 mg, 1 lozenge, Oral, PRN, Azucena Fallen, MD, 3 mg at 11/05/20 1819   metoprolol succinate (TOPROL-XL) 24 hr tablet 50 mg, 50 mg, Oral, Daily, Croitoru, Mihai, MD, 50 mg at 11/05/20 0817   nystatin (MYCOSTATIN) 100000 UNIT/ML suspension 500,000 Units, 5 mL, Oral, QID, Regalado, Belkys A, MD, 500,000 Units at 11/05/20 2054   ondansetron (ZOFRAN) tablet 4 mg, 4 mg, Oral, Q6H PRN **OR** ondansetron (ZOFRAN) injection 4 mg, 4 mg, Intravenous, Q6H PRN, Cipriano Bunker, MD, 4 mg at 10/29/20 1845   pantoprazole (PROTONIX) EC tablet 40 mg, 40 mg, Oral, Daily, Cipriano Bunker, MD, 40 mg at 11/05/20 0817   potassium chloride 10 mEq in 100 mL IVPB, 10 mEq, Intravenous, Q1 Hr x 2, Croitoru, Mihai, MD   vancomycin (VANCOREADY) IVPB 750 mg/150 mL, 750 mg, Intravenous, Q24H, Shirlee More D, RPH  Exam: Current vital signs: BP (!) 150/58 (BP Location: Left Arm)   Pulse (!) 104   Temp 99.7 F (37.6 C) (Oral)   Resp 20   Ht 4\' 11"  (1.499 m)   Wt 86.9 kg   SpO2 97%   BMI 38.69 kg/m  Vital signs in last  24 hours: Temp:  [97.6 F (36.4 C)-99.7 F (37.6 C)] 99.7 F (37.6 C) (10/13 0800) Pulse Rate:  [70-104] 104 (10/13 0800) Resp:  [18-20] 20 (10/13 0800) BP: (118-150)/(58-72) 150/58 (10/13 0800) SpO2:  [97 %-100 %] 97 % (10/13 0800)  GENERAL: Awake, restless, appears uncomfortable Psych: Patient is restless, confused, and is not cooperative with examination Head: Normocephalic and atraumatic, without obvious abnormality EENT: Normal conjunctivae, dry mucous membranes, no OP obstruction. There is some scabbing noted to her bottom lip.  LUNGS: Normal respiratory effort. Non-labored breathing on room air CV: Irregular rate and rhythm on cardiac monitor, atrial fibrillation with RVR ABDOMEN: Soft, non-distended Extremities: warm, well perfused, no pedal edema  NEURO:  Mental Status: Awake, alert. Does not answer orientation questions.  She does moan throughout assessment with extremity movement and has very usable speech but is able to state "don't touch me" and "quit" clearly. She does smile to command once but does not follow  further commands consistently.  She is unable to provide a clear and coherent history of present illness. Speech/Language: there is minimal speech output as above.  She does not name objects or repeat phrases.  No neglect is noted Cranial Nerves:  II: PERRL.  III, IV, VI: Does not fixate or track examiner. V: Blinks to threat throughout.  VII: Face is symmetric resting and grimacing VIII: Hearing is intact to voice IX, X: Unable to assess soft palate  XI: Head is grossly midline XII: Does not protrude tongue to command  Motor: Patient does not participate in formal strength training. She does not initiate antigravity movement throughout but strongly resists antigravity movement throughout. Movement is without noted asymmetry.   Tone is normal. Bulk is normal.  Sensation: Withdraws to light application of noxious stimuli throughout.  Coordination: Unable to  assess, patient does not follow commands Plantars: Mute bilaterally Gait: Deferred for patient safety  NIHSS: 1a Level of Conscious.: 0 1b LOC Questions: 2 1c LOC Commands: 1 2 Best Gaze: 0 3 Visual: 0 4 Facial Palsy: 0 5a Motor Arm - left: 3 5b Motor Arm - Right: 3 6a Motor Leg - Left: 3 6b Motor Leg - Right: 3 7 Limb Ataxia: 0 8 Sensory: 0 9 Best Language: 2 10 Dysarthria: 2 11 Extinct. and Inatten.: 0 TOTAL: 19  Labs I have reviewed labs in epic and the results pertinent to this consultation are: CBC    Component Value Date/Time   WBC 10.3 11/06/2020 0451   RBC 3.86 (L) 11/06/2020 0451   HGB 10.8 (L) 11/06/2020 0451   HCT 33.2 (L) 11/06/2020 0451   PLT 314 11/06/2020 0451   MCV 86.0 11/06/2020 0451   MCH 28.0 11/06/2020 0451   MCHC 32.5 11/06/2020 0451   RDW 15.5 11/06/2020 0451   LYMPHSABS 1.9 10/29/2020 1428   MONOABS 1.2 (H) 10/29/2020 1428   EOSABS 0.0 10/29/2020 1428   BASOSABS 0.1 10/29/2020 1428   CMP     Component Value Date/Time   NA 134 (L) 11/06/2020 0451   K 3.3 (L) 11/06/2020 0451   CL 107 11/06/2020 0451   CO2 18 (L) 11/06/2020 0451   GLUCOSE 87 11/06/2020 0451   BUN 8 11/06/2020 0451   CREATININE 0.91 11/06/2020 0451   CREATININE 0.95 (H) 12/06/2017 1605   CALCIUM 8.4 (L) 11/06/2020 0451   PROT 5.4 (L) 11/05/2020 0457   ALBUMIN 2.0 (L) 11/05/2020 0457   AST 20 11/05/2020 0457   ALT 16 11/05/2020 0457   ALKPHOS 70 11/05/2020 0457   BILITOT 0.6 11/05/2020 0457   GFRNONAA >60 11/06/2020 0451   GFRNONAA 67 06/23/2017 1045   GFRAA 77 06/23/2017 1045   Lipid Panel  No results found for: CHOL, TRIG, HDL, CHOLHDL, VLDL, LDLCALC, LDLDIRECT No results found for: HGBA1C  Imaging I have reviewed the images obtained:  CT-scan of the brain 10/13: 1. No acute intracranial abnormality and no change from 3 days prior. 2. ASPECTS is 10  Assessment: 80 y.o. female who initially presented to the ED 10/5 with confusion, fatigue, weakness,  dysuria, and was found to be septic with hypotension, tachycardia, tachypnea, and became febrile while inpatient with unclear source of infection. She did have blood cultures 1/4 positive for streptococcal parasanguinis unclear if this is a contaminant, UA with leukocytosis, and repeat blood culture 10/7 with no growth to date. Since admission she has had waxing and waning mental status with varying degrees of communication and command following. Patient does have atrial fibrillation  previously rate controlled with current RVR and concern for acute change in mental status, Code Stroke was activated.  - Examination reveals patient that is restless with very little verbalization, inconsistent command following, and resistance to passive extremity movement.  - CT was without acute intracranial abnormality and an ASPECTS of 10.  - Presentation is most consistent with metabolic encephalopathy that has been present throughout her admission. There is documentation 10/11 supporting that patient was minimally conversant and not following commands with some improvement noted yesterday but without return to baseline. The waxing and waning quality of her mental status is most likely related to encephalopathy 2/2 toxic-metabolic or infectious etiologies with fevers of unclear source. Her presentation may also be related to delirium with a prolonged hospitalization in the context of significant illness, or likely may be multifactorial with a combination of those listed above. There is low suspicion for an acute stroke etiology of presentation without focal neurologic deficits and her waxing and waning status since admission.   Recommendations: - Continue infectious work up per ID and primary team - Continue evaluation and management of metabolic derangements per primary team - If mental status does not clear with improvement in fever, infectious, and metabolic derangements, can consider MRI brain  - Try to minimize  deliriogenic medications as much as possible (J Am Geriatr Soc. 2012 Apr;60(4):616-31): benzodiazepines, anticholinergics, diphenhydramine, antihistamines, narcotics, Ambien/Lunesta/Sonata etc. - Environmental support for delirium: Lights on during the day, patient up and out of bed as much as is feasible, OT/PT, quiet dimly lit room at night, reorient patient often, provide hearing aides and glasses if patient uses them routinely, minimize sleep disruptions as much as possible overnight.   - Can consider low dose seroquel (12.5 to 25 mg nightly PRN) as needed while monitoring QTc to reduce aggressive behavior, uptitrating slowly as needed. - Please contact neurology for further questions or concerns   Lanae Boast, AGAC-NP [facility] Pager: [phone]  Attending Attestation:  Patient seen, examined, labs,vitals and notes reviewed. Discussed plan with Lanae Boast, PA and agree with assessment and plan as documented above. I have independently reviewed the chart, obtained history, review of systems and examined the patient.  Electronically signed by:  Marisue Humble, MD Page: [phone] 11/06/2020, 2:36 PM

## 2020-11-06 NOTE — Progress Notes (Signed)
PT Cancellation Note  Patient Details Name: Andrea Yu MRN: [id number] DOB: 25-Mar-1940   Cancelled Treatment:    Reason Eval/Treat Not Completed: Patient not medically ready. Pt with decline in status with Code Stroke being called and pt about to go get imaging. Will plan to follow-up as time permits.   Raymond Gurney, PT, DPT Acute Rehabilitation Services  Pager: [phone] Office: [phone]    Jewel Baize 11/06/2020, 10:45 AM

## 2020-11-06 NOTE — Progress Notes (Signed)
[facility] Infectious Diseases Follow Up Note  Patient Identification: Patient Name: Andrea Yu MRN: [id number] Admit Date: 10/29/2020  1:41 PM Age: 80 y.o.Today's Date: 11/06/2020   Reason for Visit: Streptococcus bacteremia/Fevers  Principal Problem:   Severe sepsis [facility]) Active Problems:   Adrenal insufficiency (HCC)   PAF (paroxysmal atrial fibrillation) (HCC)   Psoriatic arthritis (HCC)   Iron deficiency anemia- transfused this admission   Depression   Peripheral neuropathy   Obesity (BMI 30-39.9)- suspected sleep apnea, declines sleep study   SAH (subarachnoid hemorrhage) (HCC)   Essential hypertension   GERD (gastroesophageal reflux disease)   Antibiotics: Aztreonam 10/5-10/6                    Metronidazole 10/5                    Vancomycin 10/5, 10/12-current                     Ceftriaxone 10/7- 10/11                       Lines/Hardware:   Interval Events: T max yesterday 103, no leukocytosis. She has a code stroke early am for confusion, tachycardia   Assessment:  Intermittent fevers with unclear cause  - Unclear source of fever at this point. R/o endocarditis. TEE pending for 10/14 - Vascular duplex of LE negative for DVT  - Ceftriaxone changed to Vancomycin for concerns of drug related fevers  - She had code stroke called earlier this morning. On exam later in the afternoon, she is oriented to self , place, month and year. She is able to follow commands and moves her extremities   Streptococcal Parasanguinis 1/4 bottles  3.   Encephalopathy: seems to have improved from morning 4.   Fluid collection in the RT buttock: seems to be chronic without any signs of acute inflammation on exam, seen by surgery and less likely to be an abscess causing fevers   Comments: Unclear source of fever. Also unclear if strep isolated in 1/4 bottles is a contaminant vs a true pathogen ( given fevers and altered mental  status which seems to be somewhat improving based on documentation on on prior notes). TTE is negative for endocarditis. TEE is planned for 10/14  Recommendations Continue Vancomycin, pharmacy to dose for the time being for possibility of drug related fever.  TEE on 10/14 Consider CT chest abdomen pelvis for occult source of infection  if TEE negative for endocarditis R/o Non ID causes of Fevers  Fu Neurology recommenations  Monitor Fever curve/WBC count   Rest of the management as per the primary team. Thank you for the consult. Please page with pertinent questions or concerns.  ______________________________________________________________________ Subjective patient seen and examined at the bedside. She is lying in the bed. She wants to eat, denies any pain or discomfort  Vitals BP 128/62 (BP Location: Right Arm)   Pulse (!) 105   Temp 98.6 F (37 C) (Oral)   Resp (!) 27   Ht 4\' 11"  (1.499 m)   Wt 86.9 kg   SpO2 99%   BMI 38.69 kg/m     Physical Exam Constitutional:  Not in acute distress, lying in the bed.     Comments:   Cardiovascular:     Rate and Rhythm: Normal rate and regular rhythm.     Heart sounds:   Pulmonary:     Effort: Pulmonary effort  is normal.     Comments:   Abdominal:     Palpations: Abdomen is soft.     Tenderness: Non tender and non distended   Musculoskeletal:        General: No peripheral joint swelling and tenderness  Skin:    Comments: No obvious lesions or rashes. No wounds at the back   Neurological:     General: able to move all extremities spontaneously  Psychiatric:        Mood and Affect: Mood normal. Awake, oriented to name, place, month and year   Pertinent Microbiology Results for orders placed or performed during the hospital encounter of 10/29/20  Blood Culture (routine x 2)     Status: Abnormal (Preliminary result)   Collection Time: 10/29/20 10:45 AM   Specimen: BLOOD  Result Value Ref Range Status   Specimen  Description BLOOD BLOOD RIGHT FOREARM  Final   Special Requests   Final    BOTTLES DRAWN AEROBIC AND ANAEROBIC Blood Culture adequate volume   Culture  Setup Time   Final    GRAM POSITIVE COCCI AEROBIC BOTTLE ONLY CRITICAL RESULT CALLED TO, READ BACK BY AND VERIFIED WITH: HURTH PHARMD @0920  10/31/20 EB    Culture (A)  Final    STREPTOCOCCUS PARASANGUINIS CULTURE REINCUBATED FOR BETTER GROWTH Performed at [facility], [address] [place], [place]    Report Status PENDING  Incomplete   Organism ID, Bacteria STREPTOCOCCUS PARASANGUINIS  Final      Susceptibility   Streptococcus parasanguinis - MIC*    PENICILLIN 2 INTERMEDIATE Intermediate     CEFTRIAXONE 1 SENSITIVE Sensitive     LEVOFLOXACIN >=16 RESISTANT Resistant     VANCOMYCIN 0.5 SENSITIVE Sensitive     * STREPTOCOCCUS PARASANGUINIS  Blood Culture ID Panel (Reflexed)     Status: Abnormal   Collection Time: 10/29/20 10:45 AM  Result Value Ref Range Status   Enterococcus faecalis NOT DETECTED NOT DETECTED Final   Enterococcus Faecium NOT DETECTED NOT DETECTED Final   Listeria monocytogenes NOT DETECTED NOT DETECTED Final   Staphylococcus species NOT DETECTED NOT DETECTED Final   Staphylococcus aureus (BCID) NOT DETECTED NOT DETECTED Final   Staphylococcus epidermidis NOT DETECTED NOT DETECTED Final   Staphylococcus lugdunensis NOT DETECTED NOT DETECTED Final   Streptococcus species DETECTED (A) NOT DETECTED Final    Comment: Not Enterococcus species, Streptococcus agalactiae, Streptococcus pyogenes, or Streptococcus pneumoniae. CRITICAL RESULT CALLED TO, READ BACK BY AND VERIFIED WITH: HURTH PHARMD @0920  10/31/20 EB    Streptococcus agalactiae NOT DETECTED NOT DETECTED Final   Streptococcus pneumoniae NOT DETECTED NOT DETECTED Final   Streptococcus pyogenes NOT DETECTED NOT DETECTED Final   A.calcoaceticus-baumannii NOT DETECTED NOT DETECTED Final   Bacteroides fragilis NOT DETECTED NOT DETECTED Final    Enterobacterales NOT DETECTED NOT DETECTED Final   Enterobacter cloacae complex NOT DETECTED NOT DETECTED Final   Escherichia coli NOT DETECTED NOT DETECTED Final   Klebsiella aerogenes NOT DETECTED NOT DETECTED Final   Klebsiella oxytoca NOT DETECTED NOT DETECTED Final   Klebsiella pneumoniae NOT DETECTED NOT DETECTED Final   Proteus species NOT DETECTED NOT DETECTED Final   Salmonella species NOT DETECTED NOT DETECTED Final   Serratia marcescens NOT DETECTED NOT DETECTED Final   Haemophilus influenzae NOT DETECTED NOT DETECTED Final   Neisseria meningitidis NOT DETECTED NOT DETECTED Final   Pseudomonas aeruginosa NOT DETECTED NOT DETECTED Final   Stenotrophomonas maltophilia NOT DETECTED NOT DETECTED Final   Candida albicans NOT DETECTED  NOT DETECTED Final   Candida auris NOT DETECTED NOT DETECTED Final   Candida glabrata NOT DETECTED NOT DETECTED Final   Candida krusei NOT DETECTED NOT DETECTED Final   Candida parapsilosis NOT DETECTED NOT DETECTED Final   Candida tropicalis NOT DETECTED NOT DETECTED Final   Cryptococcus neoformans/gattii NOT DETECTED NOT DETECTED Final    Comment: Performed at [facility], [address], [place], [place] [address]  Resp Panel by RT-PCR (Flu A&B, Covid)     Status: None   Collection Time: 10/29/20  2:22 PM   Specimen: Nasopharyngeal(NP) swabs in vial transport medium  Result Value Ref Range Status   SARS Coronavirus 2 by RT PCR NEGATIVE NEGATIVE Final    Comment: (NOTE) SARS-CoV-2 target nucleic acids are NOT DETECTED.  The SARS-CoV-2 RNA is generally detectable in upper respiratory specimens during the acute phase of infection. The lowest concentration of SARS-CoV-2 viral copies this assay can detect is 138 copies/mL. A negative result does not preclude SARS-Cov-2 infection and should not be used as the sole basis for treatment or other patient management decisions. A negative result may occur with  improper specimen  collection/handling, submission of specimen other than nasopharyngeal swab, presence of viral mutation(s) within the areas targeted by this assay, and inadequate number of viral copies(<138 copies/mL). A negative result must be combined with clinical observations, patient history, and epidemiological information. The expected result is Negative.  Fact Sheet for Patients:  BloggerCourse.com  Fact Sheet for Healthcare Providers:  SeriousBroker.it  This test is no t yet approved or cleared by the [place] FDA and  has been authorized for detection and/or diagnosis of SARS-CoV-2 by FDA under an Emergency Use Authorization (EUA). This EUA will remain  in effect (meaning this test can be used) for the duration of the COVID-19 declaration under Section 564(b)(1) of the Act, 21 U.S.C.section 360bbb-3(b)(1), unless the authorization is terminated  or revoked sooner.       Influenza A by PCR NEGATIVE NEGATIVE Final   Influenza B by PCR NEGATIVE NEGATIVE Final    Comment: (NOTE) The Xpert Xpress SARS-CoV-2/FLU/RSV plus assay is intended as an aid in the diagnosis of influenza from Nasopharyngeal swab specimens and should not be used as a sole basis for treatment. Nasal washings and aspirates are unacceptable for Xpert Xpress SARS-CoV-2/FLU/RSV testing.  Fact Sheet for Patients: BloggerCourse.com  Fact Sheet for Healthcare Providers: SeriousBroker.it  This test is not yet approved or cleared by the [place] FDA and has been authorized for detection and/or diagnosis of SARS-CoV-2 by FDA under an Emergency Use Authorization (EUA). This EUA will remain in effect (meaning this test can be used) for the duration of the COVID-19 declaration under Section 564(b)(1) of the Act, 21 U.S.C. section 360bbb-3(b)(1), unless the authorization is terminated or revoked.  Performed at [facility], [address] [place], [place] [address]   Blood Culture (routine x 2)     Status: None   Collection Time: 10/29/20  2:28 PM   Specimen: BLOOD  Result Value Ref Range Status   Specimen Description BLOOD SITE NOT SPECIFIED  Final   Special Requests   Final    BOTTLES DRAWN AEROBIC AND ANAEROBIC Blood Culture adequate volume   Culture   Final    NO GROWTH 5 DAYS Performed at [facility], [address] [place], [place] [address]    Report Status 11/03/2020 FINAL  Final  Urine Culture     Status: Abnormal  Collection Time: 10/29/20  3:45 PM   Specimen: In/Out Cath Urine  Result Value Ref Range Status   Specimen Description IN/OUT CATH URINE  Final   Special Requests   Final    NONE Performed at [facility], [address] [place], [place] [address]    Culture MULTIPLE SPECIES PRESENT, SUGGEST RECOLLECTION (A)  Final   Report Status 10/30/2020 FINAL  Final  Culture, blood (routine x 2)     Status: None   Collection Time: 10/31/20  2:30 PM   Specimen: BLOOD  Result Value Ref Range Status   Specimen Description BLOOD RIGHT ANTECUBITAL  Final   Special Requests   Final    BOTTLES DRAWN AEROBIC ONLY Blood Culture results may not be optimal due to an inadequate volume of blood received in culture bottles   Culture   Final    NO GROWTH 5 DAYS Performed at [facility], [address], [place], [place] [address]    Report Status 11/05/2020 FINAL  Final  Culture, blood (routine x 2)     Status: None   Collection Time: 10/31/20  2:35 PM   Specimen: BLOOD RIGHT HAND  Result Value Ref Range Status   Specimen Description BLOOD RIGHT HAND  Final   Special Requests   Final    BOTTLES DRAWN AEROBIC ONLY Blood Culture results may not be optimal due to an inadequate volume of blood received in culture bottles   Culture   Final    NO GROWTH 5 DAYS Performed at [facility], [address] [place], [place] [address]    Report Status 11/05/2020  FINAL  Final  Urine Culture     Status: None   Collection Time: 11/04/20  5:50 AM   Specimen: Urine, Clean Catch  Result Value Ref Range Status   Specimen Description URINE, CLEAN CATCH  Final   Special Requests NONE  Final   Culture   Final    NO GROWTH Performed at [facility], [address], [place], [place] [address]    Report Status 11/04/2020 FINAL  Final  Culture, blood (routine x 2)     Status: None (Preliminary result)   Collection Time: 11/04/20  7:51 AM   Specimen: BLOOD LEFT HAND  Result Value Ref Range Status   Specimen Description BLOOD LEFT HAND  Final   Special Requests   Final    BOTTLES DRAWN AEROBIC AND ANAEROBIC Blood Culture adequate volume   Culture   Final    NO GROWTH 2 DAYS Performed at [facility], [address] [place], [place] [address]    Report Status PENDING  Incomplete  Culture, blood (routine x 2)     Status: None (Preliminary result)   Collection Time: 11/04/20  8:00 AM   Specimen: BLOOD RIGHT HAND  Result Value Ref Range Status   Specimen Description BLOOD RIGHT HAND  Final   Special Requests   Final    BOTTLES DRAWN AEROBIC AND ANAEROBIC Blood Culture adequate volume   Culture   Final    NO GROWTH 2 DAYS Performed at [facility], [address], [place], [place] [address]    Report Status PENDING  Incomplete    Pertinent Lab. CBC Latest Ref Rng & Units 11/06/2020 11/04/2020 11/03/2020  WBC 4.0 - 10.5 K/uL 10.3 10.5 11.1(H)  Hemoglobin 12.0 - 15.0 g/dL 10.8(L) 10.1(L) 9.8(L)  Hematocrit 36.0 - 46.0 % 33.2(L) 31.0(L) 30.4(L)  Platelets 150 -  400 K/uL 314 320 293   CMP Latest Ref Rng & Units 11/06/2020 11/05/2020 11/04/2020  Glucose 70 - 99 mg/dL 87 - 962(E)  BUN 8 - 23 mg/dL 8 - 8  Creatinine 3.66 - 1.00 mg/dL 2.94 7.65 4.65  Sodium 135 - 145 mmol/L 134(L) - 135  Potassium 3.5 - 5.1 mmol/L 3.3(L) - 3.7  Chloride 98 - 111 mmol/L 107 - 108  CO2 22 - 32 mmol/L 18(L) - 18(L)  Calcium 8.9 - 10.3 mg/dL 0.3(T) -  8.2(L)  Total Protein 6.5 - 8.1 g/dL - 5.4(L) -  Total Bilirubin 0.3 - 1.2 mg/dL - 0.6 -  Alkaline Phos 38 - 126 U/L - 70 -  AST 15 - 41 U/L - 20 -  ALT 0 - 44 U/L - 16 -     Pertinent Imaging today Plain films and CT images have been personally visualized and interpreted; radiology reports have been reviewed. Decision making incorporated into the Impression / Recommendations.  CT Code stroke 11/06/20 IMPRESSION: 1. No acute intracranial abnormality and no change from 3 days prior. 2. ASPECTS is 10   Venous Duplex 11/06/20 Summary:  RIGHT:  - There is no evidence of deep vein thrombosis in the lower extremity.     - No cystic structure found in the popliteal fossa.     LEFT:  - There is no evidence of deep vein thrombosis in the lower extremity.  However, portions of this examination were limited- see technologist  comments above.     - No cystic structure found in the popliteal fossa.   TTE 11/01/2020 Left ventricular ejection fraction, by estimation, is 60 to 65%. The left ventricle has normal function. The left ventricle has no regional wall motion abnormalities. Left ventricular diastolic function could not be evaluated. 1. Right ventricular systolic function is normal. The right ventricular size is normal. There is mildly elevated pulmonary artery systolic pressure. 2. 3. Left atrial size was mildly dilated. 4. The mitral valve is normal in structure. Trivial mitral valve regurgitation. The aortic valve is normal in structure. Aortic valve regurgitation is mild. Mild aortic valve sclerosis is present, with no evidence of aortic valve stenosis. 5. The inferior vena cava is dilated in size with >50% respiratory variability, suggesting right atrial pressure of 8 mmHg. 6. Conclusion(s)/Recommendation(s): No evidence of valvular vegetations on this transthoracic echocardiogram. Would recommend a transesophageal echocardiogram to exclude infective endocarditis if  clinically indicated.  I spent more than 35 minutes for this patient encounter including review of prior medical records, coordination of care  with greater than 50% of time being face to face/counseling and discussing diagnostics/treatment plan with the patient/family.  Electronically signed by:   Odette Fraction, MD Infectious Disease Physician [facility] for Infectious Disease Pager: [phone]

## 2020-11-06 NOTE — Progress Notes (Signed)
Pts HR sustaining >130s. IV metoprolol admin per MD orders.

## 2020-11-06 NOTE — Progress Notes (Signed)
PROGRESS NOTE    Andrea Yu  ZOX:[id number] DOB: 09/07/1940 DOA: 10/29/2020 PCP: Jarome Matin, MD   Brief Narrative: 80 year old with past medical history significant for paroxysmal A. fib, GERD, neuropathy, morbid obesity, psoriatic arthritis, iron deficiency anemia, history of subarachnoid hemorrhage, hypertension who presents to the ED complaining of the urinary frequency and decreased oral intake.  Patient report having UTI and was prescribed Bactrim by her PCP.  She completed course and despite still continues to have dysuria.  Patient was noted to be slightly confused. Evaluation in the ED patient was tachycardic, hypotensive, tachypneic, febrile.  Leukocytosis white blood cell 15.3.  UA moderate leukocyte.  CT abdomen show fluid collection inferior portion of the right buttock region inferior to the right ischial tuberosity.  Patient was admitted with sepsis, streptococcal parasanguinis bacteremia, ? UTI.  Plan to proceed with TEE tomorrow th 14th to rule out endocarditis.     Critical care time: Unfortunately this morning on the 13th patient was found to be tachycardic with altered mental status but otherwise unremarkable vitals was not hypoxic/hypotensive/febrile at the time of interview.  Given minimal response to verbal and tactile stimuli, inability to follow commands decision was made for code stroke given possible endocarditis patient would be high risk for septic emboli.  Fortunately patient's imaging was unremarkable for any acute findings, she did return back to baseline later this afternoon after supportive care and close monitoring.  Patient does not recall any of the events this morning, unclear if this is in the setting of ongoing sepsis or some other underlying neurological issue including but not limited to polypharmacy, sundowning or hospital delirium.  Given return to baseline we will hold off on any further imaging or aggressive testing, low threshold for MRI or  neurology discussion for other unlikely etiologies including seizure but again given patient's age current comorbid conditions this is more likely ongoing mental status changes in the setting of active infection.  Assessment & Plan:   Sepsis secondary to UTI,  Rule out bacteremia - single positive Streptococcus parasanguinis.  Presented with fever tachycardia, tachypnea and hypotension responding well to IV fluids. ID following, appreciate insight recommendations Ongoing fevers, transition from ceftriaxone to vancomycin on 11/05/2020  Blood cultures initially positive for strep para sanguinous on 10/29/2020; repeat cultures on 10/7 and 10/11 remain preliminary negative Urine culture 10/29/2020 shows polymicrobial nonspecific, repeat on 11/04/2020 remains negative TEE pending to rule out endocarditis - first available 11/07/20  Chronic fluid collection of right gluteal area - unlikely abscess, POA.  Present  with fever, hypotension concern for sepsis.  General surgery and ID has been consulted.  She had a prior history of abscess.  Per ID and surgery no need for drainage - unlikely abscess per their evaluation.   Acute Metabolic Encephalopathy, ongoing, labile - CT head no acute intracranial pathology at intake- MRI refused - Repeated CT scan after 24-48 hours symptoms negative for stroke - Rapid response stroke protocol on 11/06/2020 with repeat CT head again found to be negative for any acute findings -At this time given multiple episodes of transient mental status changes in the setting of ongoing infection if additional episode occurs may be reasonable to involve neurology for secondary causes of transient mental status changes not related to infectious process  A. fib with RVR: Cardiology consulted and following On oral Cardizem Somewhat tachycardic this morning during episode of mental status changes concerning for stroke but now resolving this afternoon currently rate controlled in the  80s at rest  TTE/ECHO; normal EF  Hypertension: Continue with Cardizem  Generalized weakness: PT OT  Depression Continue with Cymbalta. HypoKalemia: Replaced. Hyponatremia; received fluids.  Peripheral neuropathy: Continue with gabapentin   Pressure Injury 04/29/17 Stage II -  Partial thickness loss of dermis presenting as a shallow open ulcer with a red, pink wound bed without slough. healing callus, open pink wound bed (Active)  04/29/17 0800  Location: Heel  Location Orientation: Right  Staging: Stage II -  Partial thickness loss of dermis presenting as a shallow open ulcer with a red, pink wound bed without slough.  Wound Description (Comments): healing callus, open pink wound bed  Present on Admission:      Pressure Injury 10/30/20 Buttocks Right healed (Active)  10/30/20 2000  Location: Buttocks  Location Orientation: Right  Staging:   Wound Description (Comments): healed  Present on Admission: Yes        Estimated body mass index is 38.69 kg/m as calculated from the following:   Height as of this encounter:  (1.499 m).   Weight as of this encounter: 86.9 kg.   DVT prophylaxis: Lovenox Code Status: Full code Family Communication: Daughter over phone 10/10 Disposition Plan:  Status is: Inpatient  Remains inpatient appropriate because:IV treatments appropriate due to intensity of illness or inability to take PO  Dispo: The patient is from: Home              Anticipated d/c is to: Home              Patient currently is not medically stable to d/c.   Difficult to place patient No  Consultants:  ID General surgery  Procedures:    Antimicrobials:  Vanc/flagyl/aztreonam in ED x1 -  10/29/20 Ceftriaxone 10/7-10/11 Vancomycin 10/12 - ongoing  Subjective: No acute issues or events overnight, early this morning as above patient was altered unresponsive unable to follow commands, code stroke was called CT head was negative, reevaluation at lunch with  minimally improving mental status but again this afternoon after repeat visit with patient and her caretaker at bedside she appears to be more oriented, not entirely back to baseline but certainly improved from earlier today, review of systems remains somewhat limited but denies overt nausea vomiting diarrhea constipation headache fevers chills or chest pain  Objective: Vitals:   11/05/20 2016 11/05/20 2320 11/06/20 0317 11/06/20 0800  BP: 130/64 123/72 123/67 (!) 150/58  Pulse: 76 75 70 (!) 104  Resp: Temp: 97.6 F (36.4 C) 97.6 F (36.4 C) 97.6 F (36.4 C) 99.7 F (37.6 C)  TempSrc: Oral Oral Oral Oral  SpO2: 100% 98% 98% 97%  Weight:      Height:       No intake or output data in the 24 hours ending 11/06/20 0828  Filed Weights   10/30/20 0700 11/04/20 0327  Weight: 83 kg 86.9 kg    Examination:  General: Tachycardic, diaphoretic, minimally responsive -unable to follow commands HEENT:  Normocephalic atraumatic.  Sclerae nonicteric, noninjected.  Extraocular movements intact bilaterally. Neck:  Without mass or deformity.  Trachea is midline. Lungs:  Clear to auscultate bilaterally without rhonchi, wheeze, or rales. Heart: Irregularly irregular rate in the 80s and 90s without overt murmurs rubs or gallops. Abdomen:  Soft, nontender, nondistended.  Without guarding or rebound. Extremities: Without cyanosis, clubbing, edema, bilateral foot deformity, chronic Vascular:  Dorsalis pedis and posterior tibial pulses palpable bilaterally. Skin:  Warm and dry  Data Reviewed: I have personally reviewed  following labs and imaging studies  CBC: Recent Labs  Lab 11/01/20 0753 11/02/20 0746 11/03/20 0907 11/04/20 0331 11/06/20 0451  WBC 14.7* 13.2* 11.1* 10.5 10.3  HGB 9.7* 10.1* 9.8* 10.1* 10.8*  HCT 30.0* 32.0* 30.4* 31.0* 33.2*  MCV 86.5 88.4 85.2 84.5 86.0  PLT 217 231 293 320 314    Basic Metabolic Panel: Recent Labs  Lab 11/01/20 0753 11/02/20 0746  11/03/20 0907 11/04/20 0331 11/05/20 0457 11/06/20 0451  NA 131* 130* 133* 135  --  134*  K 3.5 3.5 2.9* 3.7  --  3.3*  CL 101 101 103 108  --  107  CO2 18* 18* 19* 18*  --  18*  GLUCOSE 78 77 90 112*  --  87  BUN 9 7* 9 8  --  8  CREATININE 0.96 1.09* 1.02* 0.95 0.91 0.91  CALCIUM 8.0* 8.1* 8.1* 8.2*  --  8.4*  MG  --   --  1.7  --   --   --     GFR: Estimated Creatinine Clearance: 46.5 mL/min (by C-G formula based on SCr of 0.91 mg/dL). Liver Function Tests: Recent Labs  Lab 11/05/20 0457  AST 20  ALT 16  ALKPHOS 70  BILITOT 0.6  PROT 5.4*  ALBUMIN 2.0*    No results for input(s): LIPASE, AMYLASE in the last 168 hours. No results for input(s): AMMONIA in the last 168 hours. Coagulation Profile: No results for input(s): INR, PROTIME in the last 168 hours.  Cardiac Enzymes: No results for input(s): CKTOTAL, CKMB, CKMBINDEX, TROPONINI in the last 168 hours. BNP (last 3 results) No results for input(s): PROBNP in the last 8760 hours. HbA1C: No results for input(s): HGBA1C in the last 72 hours. CBG: Recent Labs  Lab 11/04/20 0517  GLUCAP 126*    Lipid Profile: No results for input(s): CHOL, HDL, LDLCALC, TRIG, CHOLHDL, LDLDIRECT in the last 72 hours. Thyroid Function Tests: Recent Labs    11/03/20 0907  TSH 3.168    Anemia Panel: No results for input(s): VITAMINB12, FOLATE, FERRITIN, TIBC, IRON, RETICCTPCT in the last 72 hours. Sepsis Labs: No results for input(s): PROCALCITON, LATICACIDVEN in the last 168 hours.   Recent Results (from the past 240 hour(s))  Blood Culture (routine x 2)     Status: Abnormal (Preliminary result)   Collection Time: 10/29/20 10:45 AM   Specimen: BLOOD  Result Value Ref Range Status   Specimen Description BLOOD BLOOD RIGHT FOREARM  Final   Special Requests   Final    BOTTLES DRAWN AEROBIC AND ANAEROBIC Blood Culture adequate volume   Culture  Setup Time   Final    GRAM POSITIVE COCCI AEROBIC BOTTLE ONLY CRITICAL  RESULT CALLED TO, READ BACK BY AND VERIFIED WITH: HURTH PHARMD  10/31/20 EB    Culture (A)  Final    STREPTOCOCCUS PARASANGUINIS CULTURE REINCUBATED FOR BETTER GROWTH Performed at [facility], [address], [place], [place] [address]    Report Status PENDING  Incomplete   Organism ID, Bacteria STREPTOCOCCUS PARASANGUINIS  Final      Susceptibility   Streptococcus parasanguinis - MIC*    PENICILLIN 2 INTERMEDIATE Intermediate     CEFTRIAXONE 1 SENSITIVE Sensitive     LEVOFLOXACIN >=16 RESISTANT Resistant     VANCOMYCIN 0.5 SENSITIVE Sensitive     * STREPTOCOCCUS PARASANGUINIS  Blood Culture ID Panel (Reflexed)     Status: Abnormal   Collection Time: 10/29/20 10:45 AM  Result Value Ref Range Status  Enterococcus faecalis NOT DETECTED NOT DETECTED Final   Enterococcus Faecium NOT DETECTED NOT DETECTED Final   Listeria monocytogenes NOT DETECTED NOT DETECTED Final   Staphylococcus species NOT DETECTED NOT DETECTED Final   Staphylococcus aureus (BCID) NOT DETECTED NOT DETECTED Final   Staphylococcus epidermidis NOT DETECTED NOT DETECTED Final   Staphylococcus lugdunensis NOT DETECTED NOT DETECTED Final   Streptococcus species DETECTED (A) NOT DETECTED Final    Comment: Not Enterococcus species, Streptococcus agalactiae, Streptococcus pyogenes, or Streptococcus pneumoniae. CRITICAL RESULT CALLED TO, READ BACK BY AND VERIFIED WITH: HURTH PHARMD  10/31/20 EB    Streptococcus agalactiae NOT DETECTED NOT DETECTED Final   Streptococcus pneumoniae NOT DETECTED NOT DETECTED Final   Streptococcus pyogenes NOT DETECTED NOT DETECTED Final   A.calcoaceticus-baumannii NOT DETECTED NOT DETECTED Final   Bacteroides fragilis NOT DETECTED NOT DETECTED Final   Enterobacterales NOT DETECTED NOT DETECTED Final   Enterobacter cloacae complex NOT DETECTED NOT DETECTED Final   Escherichia coli NOT DETECTED NOT DETECTED Final   Klebsiella aerogenes NOT DETECTED NOT DETECTED Final    Klebsiella oxytoca NOT DETECTED NOT DETECTED Final   Klebsiella pneumoniae NOT DETECTED NOT DETECTED Final   Proteus species NOT DETECTED NOT DETECTED Final   Salmonella species NOT DETECTED NOT DETECTED Final   Serratia marcescens NOT DETECTED NOT DETECTED Final   Haemophilus influenzae NOT DETECTED NOT DETECTED Final   Neisseria meningitidis NOT DETECTED NOT DETECTED Final   Pseudomonas aeruginosa NOT DETECTED NOT DETECTED Final   Stenotrophomonas maltophilia NOT DETECTED NOT DETECTED Final   Candida albicans NOT DETECTED NOT DETECTED Final   Candida auris NOT DETECTED NOT DETECTED Final   Candida glabrata NOT DETECTED NOT DETECTED Final   Candida krusei NOT DETECTED NOT DETECTED Final   Candida parapsilosis NOT DETECTED NOT DETECTED Final   Candida tropicalis NOT DETECTED NOT DETECTED Final   Cryptococcus neoformans/gattii NOT DETECTED NOT DETECTED Final    Comment: Performed at [facility], [address], [place], [place] [address]  Resp Panel by RT-PCR (Flu A&B, Covid)     Status: None   Collection Time: 10/29/20  2:22 PM   Specimen: Nasopharyngeal(NP) swabs in vial transport medium  Result Value Ref Range Status   SARS Coronavirus 2 by RT PCR NEGATIVE NEGATIVE Final    Comment: (NOTE) SARS-CoV-2 target nucleic acids are NOT DETECTED.  The SARS-CoV-2 RNA is generally detectable in upper respiratory specimens during the acute phase of infection. The lowest concentration of SARS-CoV-2 viral copies this assay can detect is 138 copies/mL. A negative result does not preclude SARS-Cov-2 infection and should not be used as the sole basis for treatment or other patient management decisions. A negative result may occur with  improper specimen collection/handling, submission of specimen other than nasopharyngeal swab, presence of viral mutation(s) within the areas targeted by this assay, and inadequate number of viral copies(<138 copies/mL). A negative result must be combined  with clinical observations, patient history, and epidemiological information. The expected result is Negative.  Fact Sheet for Patients:  BloggerCourse.com  Fact Sheet for Healthcare Providers:  SeriousBroker.it  This test is no t yet approved or cleared by the [place] FDA and  has been authorized for detection and/or diagnosis of SARS-CoV-2 by FDA under an Emergency Use Authorization (EUA). This EUA will remain  in effect (meaning this test can be used) for the duration of the COVID-19 declaration under Section 564(b)(1) of the Act, 21 U.S.C.section 360bbb-3(b)(1), unless the authorization is terminated  or revoked sooner.  Influenza A by PCR NEGATIVE NEGATIVE Final   Influenza B by PCR NEGATIVE NEGATIVE Final    Comment: (NOTE) The Xpert Xpress SARS-CoV-2/FLU/RSV plus assay is intended as an aid in the diagnosis of influenza from Nasopharyngeal swab specimens and should not be used as a sole basis for treatment. Nasal washings and aspirates are unacceptable for Xpert Xpress SARS-CoV-2/FLU/RSV testing.  Fact Sheet for Patients: BloggerCourse.com  Fact Sheet for Healthcare Providers: SeriousBroker.it  This test is not yet approved or cleared by the [place] FDA and has been authorized for detection and/or diagnosis of SARS-CoV-2 by FDA under an Emergency Use Authorization (EUA). This EUA will remain in effect (meaning this test can be used) for the duration of the COVID-19 declaration under Section 564(b)(1) of the Act, 21 U.S.C. section 360bbb-3(b)(1), unless the authorization is terminated or revoked.  Performed at [facility], [address] [place], [place] [address]   Blood Culture (routine x 2)     Status: None   Collection Time: 10/29/20  2:28 PM   Specimen: BLOOD  Result Value Ref Range Status   Specimen Description BLOOD SITE NOT  SPECIFIED  Final   Special Requests   Final    BOTTLES DRAWN AEROBIC AND ANAEROBIC Blood Culture adequate volume   Culture   Final    NO GROWTH 5 DAYS Performed at [facility], [address], [place], [place] [address]    Report Status 11/03/2020 FINAL  Final  Urine Culture     Status: Abnormal   Collection Time: 10/29/20  3:45 PM   Specimen: In/Out Cath Urine  Result Value Ref Range Status   Specimen Description IN/OUT CATH URINE  Final   Special Requests   Final    NONE Performed at [facility], [address], [place], [place] [address]    Culture MULTIPLE SPECIES PRESENT, SUGGEST RECOLLECTION (A)  Final   Report Status 10/30/2020 FINAL  Final  Culture, blood (routine x 2)     Status: None   Collection Time: 10/31/20  2:30 PM   Specimen: BLOOD  Result Value Ref Range Status   Specimen Description BLOOD RIGHT ANTECUBITAL  Final   Special Requests   Final    BOTTLES DRAWN AEROBIC ONLY Blood Culture results may not be optimal due to an inadequate volume of blood received in culture bottles   Culture   Final    NO GROWTH 5 DAYS Performed at [facility], [address], [place], [place] [address]    Report Status 11/05/2020 FINAL  Final  Culture, blood (routine x 2)     Status: None   Collection Time: 10/31/20  2:35 PM   Specimen: BLOOD RIGHT HAND  Result Value Ref Range Status   Specimen Description BLOOD RIGHT HAND  Final   Special Requests   Final    BOTTLES DRAWN AEROBIC ONLY Blood Culture results may not be optimal due to an inadequate volume of blood received in culture bottles   Culture   Final    NO GROWTH 5 DAYS Performed at [facility], [address], [place], [place] [address]    Report Status 11/05/2020 FINAL  Final  Urine Culture     Status: None   Collection Time: 11/04/20  5:50 AM   Specimen: Urine, Clean Catch  Result Value Ref Range Status   Specimen Description URINE, CLEAN CATCH  Final   Special Requests NONE  Final    Culture  Final    NO GROWTH Performed at [facility], [address] [place], [place] [address]    Report Status 11/04/2020 FINAL  Final  Culture, blood (routine x 2)     Status: None (Preliminary result)   Collection Time: 11/04/20  7:51 AM   Specimen: BLOOD LEFT HAND  Result Value Ref Range Status   Specimen Description BLOOD LEFT HAND  Final   Special Requests   Final    BOTTLES DRAWN AEROBIC AND ANAEROBIC Blood Culture adequate volume   Culture   Final    NO GROWTH 1 DAY Performed at [facility], [address], [place], [place] [address]    Report Status PENDING  Incomplete  Culture, blood (routine x 2)     Status: None (Preliminary result)   Collection Time: 11/04/20  8:00 AM   Specimen: BLOOD RIGHT HAND  Result Value Ref Range Status   Specimen Description BLOOD RIGHT HAND  Final   Special Requests   Final    BOTTLES DRAWN AEROBIC AND ANAEROBIC Blood Culture adequate volume   Culture   Final    NO GROWTH 1 DAY Performed at [facility], [address], [place], [place] [address]    Report Status PENDING  Incomplete          Radiology Studies: US RENAL  Result Date: 11/04/2020 CLINICAL DATA:  Hypertension, pyelonephritis. EXAM: RENAL / URINARY TRACT ULTRASOUND COMPLETE COMPARISON:  CT scan 10/29/2020 FINDINGS: Right Kidney: Renal measurements: 13.0 by 4.7 by 5.9 cm = volume: 167 mL. Echogenicity within normal limits. No solid mass or hydronephrosis visualized. Indistinct posterior 1.4 by 0.8 cm exophytic lesion from the right kidney upper pole posteriorly corresponding to the small cyst seen on the CT of 10/29/2020. Left Kidney: Renal measurements: 12.4 by 5.1 by 5.0 cm = volume: 164 mL. Echogenicity within normal limits. No mass or hydronephrosis visualized. 0.8 cm cyst of the upper pole on image 22 series 1-1. Bladder: Appears normal for degree of bladder distention. Other: None. IMPRESSION: 1. Single tiny bilateral upper pole cysts. 2. No  evidence of hydronephrosis or hydroureter. No additional significant renal parenchymal findings. Please note that pyelonephritis can be occult on sonography. No renal abscess is identified. Electronically Signed   By: Gaylyn Rong M.D.   On: 11/04/2020 14:06   VAS [place] LOWER EXTREMITY VENOUS (DVT)  Result Date: 11/05/2020  Lower Venous DVT Study Patient Name:  LARESSA BOLINGER  Date of Exam:   11/05/2020 Medical Rec #: [id number]      Accession #:    [id number] Date of Birth: 21-Dec-1940     Patient Gender: F Patient Age:   73 years Exam Location:  [facility] Procedure:      VAS [place] LOWER EXTREMITY VENOUS (DVT) Referring Phys: Odette Fraction --------------------------------------------------------------------------------  Indications: Bacteremia.  Limitations: Patient movement and positioning. Comparison Study: no prior Performing Technologist: Argentina Ponder RVS  Examination Guidelines: A complete evaluation includes B-mode imaging, spectral Doppler, color Doppler, and power Doppler as needed of all accessible portions of each vessel. Bilateral testing is considered an integral part of a complete examination. Limited examinations for reoccurring indications may be performed as noted. The reflux portion of the exam is performed with the patient in reverse Trendelenburg.  +---------+---------------+---------+-----------+----------+-------------------+ RIGHT    CompressibilityPhasicitySpontaneityPropertiesThrombus Aging      +---------+---------------+---------+-----------+----------+-------------------+ CFV      Full           Yes  Yes                                      +---------+---------------+---------+-----------+----------+-------------------+ SFJ      Full                                                             +---------+---------------+---------+-----------+----------+-------------------+ FV Prox  Full                                                              +---------+---------------+---------+-----------+----------+-------------------+ FV Mid   Full                                                             +---------+---------------+---------+-----------+----------+-------------------+ FV DistalFull                                                             +---------+---------------+---------+-----------+----------+-------------------+ PFV      Full                                                             +---------+---------------+---------+-----------+----------+-------------------+ POP      Full           Yes      Yes                                      +---------+---------------+---------+-----------+----------+-------------------+ PTV      Full                                                             +---------+---------------+---------+-----------+----------+-------------------+ PERO                                                  Not well visualized +---------+---------------+---------+-----------+----------+-------------------+   +---------+---------------+---------+-----------+----------+-------------------+ LEFT     CompressibilityPhasicitySpontaneityPropertiesThrombus Aging      +---------+---------------+---------+-----------+----------+-------------------+ CFV      Full           Yes      Yes                                      +---------+---------------+---------+-----------+----------+-------------------+  SFJ      Full                                                             +---------+---------------+---------+-----------+----------+-------------------+ FV Prox  Full                                                             +---------+---------------+---------+-----------+----------+-------------------+ FV Mid   Full                                                              +---------+---------------+---------+-----------+----------+-------------------+ FV DistalFull                                                             +---------+---------------+---------+-----------+----------+-------------------+ PFV      Full                                                             +---------+---------------+---------+-----------+----------+-------------------+ POP      Full           Yes      Yes                                      +---------+---------------+---------+-----------+----------+-------------------+ PTV                                                   Not well visualized +---------+---------------+---------+-----------+----------+-------------------+ PERO                                                  Not well visualized +---------+---------------+---------+-----------+----------+-------------------+     Summary: RIGHT: - There is no evidence of deep vein thrombosis in the lower extremity.  - No cystic structure found in the popliteal fossa.  LEFT: - There is no evidence of deep vein thrombosis in the lower extremity. However, portions of this examination were limited- see technologist comments above.  - No cystic structure found in the popliteal fossa.  *See table(s) above for measurements and observations. Electronically signed by Coral Else MD on 11/05/2020 at 9:28:13 PM.    Final         Scheduled Meds:  aspirin EC  81 mg Oral QPM   diltiazem  30 mg Oral Q6H   docusate sodium  100 mg Oral BID   DULoxetine  90 mg Oral Daily   enoxaparin (LOVENOX) injection  40 mg Subcutaneous Q24H   metoprolol succinate  50 mg Oral Daily   nystatin  5 mL Oral QID   pantoprazole  40 mg Oral Daily   Continuous Infusions:  vancomycin       LOS: 8 days    Critical care time >60 minutes was spent at bedside, reviewing imaging labs and discussing case with patient her family and caretaker over the phone at bedside.  Azucena Fallen, DO [facility]   If 7PM-7AM, please contact night-coverage [email or web address]  11/06/2020, 8:28 AM

## 2020-11-06 NOTE — Progress Notes (Signed)
 Progress Note  Patient Name: Andrea Yu Date of Encounter: 11/06/2020  [facility] Cardiologist: Edwina Grossberg, MD   Subjective   First 24 hours without true fever, T-max 99.7 F. Yesterday was well rate controlled, today is markedly tachycardic with RVR to the 130s-140s.  Appears tachypneic.  Seems to deny dyspnea with a shake of her head. Yesterday, she was alert and appeared oriented and was quite communicative.  Today she is nonverbal, responding only with grunts and interjections, only following instructions inconsistently.  Inpatient Medications    Scheduled Meds:  aspirin EC  81 mg Oral QPM   diltiazem  30 mg Oral Q6H   docusate sodium  100 mg Oral BID   DULoxetine  90 mg Oral Daily   enoxaparin (LOVENOX) injection  40 mg Subcutaneous Q24H   metoprolol succinate  50 mg Oral Daily   nystatin  5 mL Oral QID   pantoprazole  40 mg Oral Daily   Continuous Infusions:  vancomycin     PRN Meds: acetaminophen **OR** acetaminophen, HYDROcodone-acetaminophen, ipratropium-albuterol, menthol-cetylpyridinium, ondansetron **OR** ondansetron (ZOFRAN) IV   Vital Signs    Vitals:   11/05/20 2016 11/05/20 2320 11/06/20 0317 11/06/20 0800  BP: 130/64 123/72 123/67 (!) 150/58  Pulse: 76 75 70 (!) 104  Resp: 18 19 18 20  Temp: 97.6 F (36.4 C) 97.6 F (36.4 C) 97.6 F (36.4 C) 99.7 F (37.6 C)  TempSrc: Oral Oral Oral Oral  SpO2: 100% 98% 98% 97%  Weight:      Height:       No intake or output data in the 24 hours ending 11/06/20 1013 Last 3 Weights 11/04/2020 10/30/2020 12/13/2019  Weight (lbs) 191 lb 9.3 oz 182 lb 15.7 oz 183 lb  Weight (kg) 86.9 kg 83 kg 83.008 kg      Telemetry    Atrial fibrillation, rate controlled- Personally Reviewed  ECG    No new tracing- Personally Reviewed  Physical Exam  Severely obese, appears uncomfortable, tachypneic GEN: No acute distress.   Neck: Hard to see, but appeared distended Cardiac: Irregular, faint systolic  ejection murmur in the aortic focus, no diastolic murmurs, rubs, or gallops.  Respiratory: Clear to auscultation bilaterally. GI: Soft, nontender, non-distended  MS: No edema; No deformity. Neuro:  Nonfocal  Psych: Normal affect   Labs    High Sensitivity Troponin:  No results for input(s): TROPONINIHS in the last 720 hours.   Chemistry Recent Labs  Lab 11/03/20 0907 11/04/20 0331 11/05/20 0457 11/06/20 0451  NA 133* 135  --  134*  K 2.9* 3.7  --  3.3*  CL 103 108  --  107  CO2 19* 18*  --  18*  GLUCOSE 90 112*  --  87  BUN 9 8  --  8  CREATININE 1.02* 0.95 0.91 0.91  CALCIUM 8.1* 8.2*  --  8.4*  MG 1.7  --   --   --   PROT  --   --  5.4*  --   ALBUMIN  --   --  2.0*  --   AST  --   --  20  --   ALT  --   --  16  --   ALKPHOS  --   --  70  --   BILITOT  --   --  0.6  --   GFRNONAA 55* >60 >60 >60  ANIONGAP 11 9  --  9    Lipids No results for input(s): CHOL,   TRIG, HDL, LABVLDL, LDLCALC, CHOLHDL in the last 168 hours.  Hematology Recent Labs  Lab 11/03/20 0907 11/04/20 0331 11/06/20 0451  WBC 11.1* 10.5 10.3  RBC 3.57* 3.67* 3.86*  HGB 9.8* 10.1* 10.8*  HCT 30.4* 31.0* 33.2*  MCV 85.2 84.5 86.0  MCH 27.5 27.5 28.0  MCHC 32.2 32.6 32.5  RDW 15.0 14.7 15.5  PLT 293 320 314   Thyroid  Recent Labs  Lab 11/03/20 0907  TSH 3.168    BNPNo results for input(s): BNP, PROBNP in the last 168 hours.  DDimer No results for input(s): DDIMER in the last 168 hours.   Radiology    US RENAL  Result Date: 11/04/2020 CLINICAL DATA:  Hypertension, pyelonephritis. EXAM: RENAL / URINARY TRACT ULTRASOUND COMPLETE COMPARISON:  CT scan 10/29/2020 FINDINGS: Right Kidney: Renal measurements: 13.0 by 4.7 by 5.9 cm = volume: 167 mL. Echogenicity within normal limits. No solid mass or hydronephrosis visualized. Indistinct posterior 1.4 by 0.8 cm exophytic lesion from the right kidney upper pole posteriorly corresponding to the small cyst seen on the CT of 10/29/2020. Left Kidney:  Renal measurements: 12.4 by 5.1 by 5.0 cm = volume: 164 mL. Echogenicity within normal limits. No mass or hydronephrosis visualized. 0.8 cm cyst of the upper pole on image 22 series 1-1. Bladder: Appears normal for degree of bladder distention. Other: None. IMPRESSION: 1. Single tiny bilateral upper pole cysts. 2. No evidence of hydronephrosis or hydroureter. No additional significant renal parenchymal findings. Please note that pyelonephritis can be occult on sonography. No renal abscess is identified. Electronically Signed   By: Gaylyn Rong M.D.   On: 11/04/2020 14:06   VAS [place] LOWER EXTREMITY VENOUS (DVT)  Result Date: 11/05/2020  Lower Venous DVT Study Patient Name:  CARMESHA MOROCCO  Date of Exam:   11/05/2020 Medical Rec #: [id number]      Accession #:    [id number] Date of Birth: Jul 25, 1940     Patient Gender: F Patient Age:   80 years Exam Location:  [facility] Procedure:      VAS [place] LOWER EXTREMITY VENOUS (DVT) Referring Phys: Odette Fraction --------------------------------------------------------------------------------  Indications: Bacteremia.  Limitations: Patient movement and positioning. Comparison Study: no prior Performing Technologist: [place] Ponder RVS  Examination Guidelines: A complete evaluation includes B-mode imaging, spectral Doppler, color Doppler, and power Doppler as needed of all accessible portions of each vessel. Bilateral testing is considered an integral part of a complete examination. Limited examinations for reoccurring indications may be performed as noted. The reflux portion of the exam is performed with the patient in reverse Trendelenburg.  +---------+---------------+---------+-----------+----------+-------------------+ RIGHT    CompressibilityPhasicitySpontaneityPropertiesThrombus Aging      +---------+---------------+---------+-----------+----------+-------------------+ CFV      Full           Yes      Yes                                       +---------+---------------+---------+-----------+----------+-------------------+ SFJ      Full                                                             +---------+---------------+---------+-----------+----------+-------------------+ FV Prox  Full                                                             +---------+---------------+---------+-----------+----------+-------------------+  FV Mid   Full                                                             +---------+---------------+---------+-----------+----------+-------------------+ FV DistalFull                                                             +---------+---------------+---------+-----------+----------+-------------------+ PFV      Full                                                             +---------+---------------+---------+-----------+----------+-------------------+ POP      Full           Yes      Yes                                      +---------+---------------+---------+-----------+----------+-------------------+ PTV      Full                                                             +---------+---------------+---------+-----------+----------+-------------------+ PERO                                                  Not well visualized +---------+---------------+---------+-----------+----------+-------------------+   +---------+---------------+---------+-----------+----------+-------------------+ LEFT     CompressibilityPhasicitySpontaneityPropertiesThrombus Aging      +---------+---------------+---------+-----------+----------+-------------------+ CFV      Full           Yes      Yes                                      +---------+---------------+---------+-----------+----------+-------------------+ SFJ      Full                                                             +---------+---------------+---------+-----------+----------+-------------------+ FV  Prox  Full                                                             +---------+---------------+---------+-----------+----------+-------------------+ FV Mid   Full                                                             +---------+---------------+---------+-----------+----------+-------------------+  FV DistalFull                                                             +---------+---------------+---------+-----------+----------+-------------------+ PFV      Full                                                             +---------+---------------+---------+-----------+----------+-------------------+ POP      Full           Yes      Yes                                      +---------+---------------+---------+-----------+----------+-------------------+ PTV                                                   Not well visualized +---------+---------------+---------+-----------+----------+-------------------+ PERO                                                  Not well visualized +---------+---------------+---------+-----------+----------+-------------------+     Summary: RIGHT: - There is no evidence of deep vein thrombosis in the lower extremity.  - No cystic structure found in the popliteal fossa.  LEFT: - There is no evidence of deep vein thrombosis in the lower extremity. However, portions of this examination were limited- see technologist comments above.  - No cystic structure found in the popliteal fossa.  *See table(s) above for measurements and observations. Electronically signed by Coral Else MD on 11/05/2020 at 9:28:13 PM.    Final     Cardiac Studies   Transthoracic echocardiogram 11/01/2020   1. Left ventricular ejection fraction, by estimation, is 60 to 65%. The  left ventricle has normal function. The left ventricle has no regional  wall motion abnormalities. Left ventricular diastolic function could not  be evaluated.   2. Right  ventricular systolic function is normal. The right ventricular  size is normal. There is mildly elevated pulmonary artery systolic  pressure.   3. Left atrial size was mildly dilated.   4. The mitral valve is normal in structure. Trivial mitral valve  regurgitation.   5. The aortic valve is normal in structure. Aortic valve regurgitation is  mild. Mild aortic valve sclerosis is present, with no evidence of aortic  valve stenosis.   6. The inferior vena cava is dilated in size with >50% respiratory  variability, suggesting right atrial pressure of 8 mmHg.   Patient Profile     80 y.o. female with atrial fibrillation, essential hypertension, psoriatic arthritis, history of neuropathy with orthostatic hypotension and frequent falls presenting with acute febrile illness, encephalopathy initially felt to be urosepsis, but subsequently demonstrated to have Streptococcus parasanguinis bacteremia (albeit only 1 of 4 culture bottles)  Assessment & Plan  Encephalopathy: This appears to have cleared yesterday, but has returned.  Only follows certain commands, but do not identify any clear evidence of focal neurological event.  Consider head CT since she is not on anticoagulation. Sepsis: Fever finally tapering off.  WBC at upper limit of normal.  Initial impression was that she has urosepsis, but slow response to antibiotics and the one positive blood culture for strep viridans raises concern for endocarditis.  Scheduled for transesophageal echocardiogram tomorrow 1300h.  If no vegetations are seen and subsequent drawn blood cultures remain negative, may not need prolonged treatment for endocarditis. AFib: Well rate controlled until last evening, now with RVR despite receiving medications (she received her 6 AM dose of diltiazem, but has not yet received her daily dose of metoprolol succinate).  Not on anticoagulation due to history of recurrent falls and intracranial hemorrhage.  We will switch to  intravenous diltiazem for better rate control.  Check chest x-ray.  Check BNP.  Lower extremity venous Doppler negative for clot yesterday.  Hypokalemia: Mild, not sure we can get her to cooperate with oral medications we will switch to intravenous supplement.      For questions or updates, please contact [facility] Please consult [email or web address] for contact info under        Signed, Thurmon Fair, MD  11/06/2020, 10:13 AM

## 2020-11-06 NOTE — Progress Notes (Signed)
Pt was found to have changes from initial assessment baseline this morning. Unable to answer questions or perform commands. Pt became febrile and converted to a-fib. MD at bedside, orders placed, and code stroke activated.  Brooke Pace, RN

## 2020-11-07 ENCOUNTER — Encounter (HOSPITAL_COMMUNITY)
Admission: EM | Disposition: A | Discharge status: Home Health | Payer: Self-pay | Source: Home / Self Care | Attending: Internal Medicine

## 2020-11-07 ENCOUNTER — Encounter (HOSPITAL_COMMUNITY): Payer: Self-pay | Admitting: Family Medicine

## 2020-11-07 ENCOUNTER — Inpatient Hospital Stay (HOSPITAL_COMMUNITY): Payer: Medicare Other

## 2020-11-07 ENCOUNTER — Inpatient Hospital Stay (HOSPITAL_COMMUNITY): Payer: Medicare Other | Admitting: Critical Care Medicine

## 2020-11-07 DIAGNOSIS — A419 Sepsis, unspecified organism: Secondary | ICD-10-CM | POA: Diagnosis not present

## 2020-11-07 DIAGNOSIS — Q2112 Patent foramen ovale: Secondary | ICD-10-CM

## 2020-11-07 DIAGNOSIS — R7881 Bacteremia: Secondary | ICD-10-CM | POA: Diagnosis not present

## 2020-11-07 DIAGNOSIS — I4821 Permanent atrial fibrillation: Secondary | ICD-10-CM | POA: Diagnosis not present

## 2020-11-07 DIAGNOSIS — R6520 Severe sepsis without septic shock: Secondary | ICD-10-CM | POA: Diagnosis not present

## 2020-11-07 HISTORY — PX: BUBBLE STUDY: SHX6837

## 2020-11-07 HISTORY — PX: TEE WITHOUT CARDIOVERSION: SHX5443

## 2020-11-07 LAB — GLUCOSE, CAPILLARY: Glucose-Capillary: 84 mg/dL (ref 70–99)

## 2020-11-07 SURGERY — ECHOCARDIOGRAM, TRANSESOPHAGEAL
Anesthesia: Monitor Anesthesia Care

## 2020-11-07 MED ORDER — POTASSIUM CHLORIDE 10 MEQ/100ML IV SOLN
10.0000 meq | INTRAVENOUS | Status: AC
  Administered 2020-11-07 (×2): 10 meq via INTRAVENOUS
  Filled 2020-11-07 (×2): qty 100

## 2020-11-07 MED ORDER — PHENYLEPHRINE HCL-NACL 20-0.9 MG/250ML-% IV SOLN
INTRAVENOUS | Status: DC | PRN
Start: 2020-11-07 — End: 2020-11-07
  Administered 2020-11-07: 25 ug/min via INTRAVENOUS

## 2020-11-07 MED ORDER — SODIUM CHLORIDE 0.9 % IV SOLN: INTRAVENOUS | Status: DC

## 2020-11-07 MED ORDER — PROPOFOL 10 MG/ML IV BOLUS
INTRAVENOUS | Status: DC | PRN
  Administered 2020-11-07 (×3): 10 mg via INTRAVENOUS

## 2020-11-07 MED ORDER — POTASSIUM CHLORIDE 10 MEQ/100ML IV SOLN: 10.0000 meq | INTRAVENOUS | Status: AC

## 2020-11-07 MED ORDER — LIDOCAINE 2% (20 MG/ML) 5 ML SYRINGE
INTRAMUSCULAR | Status: DC | PRN
  Administered 2020-11-07: 60 mg via INTRAVENOUS

## 2020-11-07 MED ORDER — SODIUM CHLORIDE 0.9 % IV SOLN: INTRAVENOUS | Status: AC

## 2020-11-07 MED ORDER — PROPOFOL 500 MG/50ML IV EMUL
INTRAVENOUS | Status: DC | PRN
  Administered 2020-11-07: 150 ug/kg/min via INTRAVENOUS

## 2020-11-07 MED ORDER — INFLUENZA VAC A&B SA ADJ QUAD 0.5 ML IM PRSY
0.5000 mL | PREFILLED_SYRINGE | INTRAMUSCULAR | Status: DC
  Filled 2020-11-07: qty 0.5

## 2020-11-07 MED ORDER — IOHEXOL 350 MG/ML SOLN
100.0000 mL | Freq: Once | INTRAVENOUS | Status: AC | PRN
  Administered 2020-11-07: 100 mL via INTRAVENOUS

## 2020-11-07 NOTE — Progress Notes (Signed)
  Echocardiogram Echocardiogram Transesophageal has been performed.  Gerda Diss 11/07/2020, 9:49 AM

## 2020-11-07 NOTE — Progress Notes (Signed)
Reviewed TEE images. There is no evidence of endocarditis. Se remains febrile and encephalopathic, but no evidence to support endocarditis.

## 2020-11-07 NOTE — Anesthesia Procedure Notes (Signed)
Procedure Name: MAC Date/Time: 11/07/2020 9:01 AM Performed by: Wilburn Cornelia, CRNA Pre-anesthesia Checklist: Patient identified, Emergency Drugs available, Suction available, Patient being monitored and Timeout performed Patient Re-evaluated:Patient Re-evaluated prior to induction Oxygen Delivery Method: Nasal cannula Placement Confirmation: positive ETCO2 and breath sounds checked- equal and bilateral Dental Injury: Teeth and Oropharynx as per pre-operative assessment

## 2020-11-07 NOTE — Progress Notes (Signed)
Pharmacy Antibiotic Note  Andrea Yu is a 80 y.o. female admitted on 10/29/2020 with bacteremia.  Pharmacy has been consulted for Vancomycin dosing.  Andrea Yu has Streptococcal Parasanguinis bacteremia. Was on Ceftriaxone, now with a concern for drug-induced fevers. Switched to Vancomycin per ID and monitoring the fever curve. Additional sensitivities have been requested form micro lab.   Continues to be altered, Scr stable, still with fevers TEE planned for today  Plan: Continue Vancomycin 750 mg IV q24h (eAUC 542, Scr 0.91) Goal AUC 400-550 Monitor renal function, micro data, and clinical status  Height: 4\' 11"  (149.9 cm) Weight: 86.9 kg (191 lb 9.3 oz) IBW/kg (Calculated) : 43.2  Temp (24hrs), Avg:99.6 F (37.6 C), Min:97.4 F (36.3 C), Max:103 F (39.4 C)  Recent Labs  Lab 11/01/20 0753 11/02/20 0746 11/03/20 0907 11/04/20 0331 11/05/20 0457 11/06/20 0451  WBC 14.7* 13.2* 11.1* 10.5  --  10.3  CREATININE 0.96 1.09* 1.02* 0.95 0.91 0.91     Estimated Creatinine Clearance: 46.5 mL/min (by C-G formula based on SCr of 0.91 mg/dL).    Allergies  Allergen Reactions   Penicillins Anaphylaxis    THROAT SWELLING  Has patient had a PCN reaction causing immediate rash, facial/tongue/throat swelling, SOB or lightheadedness with hypotension:  # # YES # #  Has patient had a PCN reaction causing severe rash involving mucus membranes or skin necrosis: # # # YES # # # Has patient had a PCN reaction that required hospitalization:No Has patient had a PCN reaction occurring within the last 10 years:  # # YES # #  If all of the above answers are "NO", then may proceed with Cephalosporin use.    Prolia [Denosumab] Other (See Comments)    Severe Pain in the groin area.   Ceftriaxone     Possible cause of neutropenia   Ciprofloxacin Hcl Other (See Comments)   Fosamax [Alendronate Sodium]     UNSPECIFIED REACTION    Latex Swelling   Lisinopril Cough   Tubersol [Tuberculin Ppd]  Other (See Comments)    Unknown     Antimicrobials this admission: Ceftriaxone  10/5 >> 10/11 Vancomycin 10/12 >>   Dose adjustments this admission: None  Microbiology results: 10/5 BCx: Strep Parasanguinis 10/11 BCx: NGTD  Thank you 12/11, PharmD    Please check [email or web address] for unit-specific pharmacy phone numbers

## 2020-11-07 NOTE — Progress Notes (Signed)
Speech Language Pathology Treatment: Dysphagia  Patient Details Name: Andrea Yu MRN: [id number] DOB: [birth year] Today's Date: 11/07/2020 Time: 0349-1791 SLP Time Calculation (min) (ACUTE ONLY): 11 min  Assessment / Plan / Recommendation Clinical Impression  Pt remains drowsy; caregiver at bedside.  She fed herself crackers and ginger ale with good oral attention, prolonged but functional mastication, and no s/s of aspiration. BS have been stable and no chest imaging has been warranted.  No suspicion for aspiration.  Recommend continuing dysphagia 3 diet to allow greater food choices. Continue to offer assist as needed with self-feeding. As MS clears, her interest in food and efficiency with eating should improve. There are no further acute care SLP needs. Our service will sign off.    HPI HPI: Pt is an 80 y.o. female admitted 10/29/20 with confusion, decreased oral intake, urinary frequency. Ct head negative. Pt declined MRI. Dx sepsis secondary to UTI, acute metabolic encephalopathy. Abdominal CT with questionable R gluteal abscess. CXR 10/5 negative for acute changes. PMH: PAF, neuropathy, anemia, SAH, HTN, obesity, GERD.      SLP Plan  All goals met      Recommendations for follow up therapy are one component of a multi-disciplinary discharge planning process, led by the attending physician.  Recommendations may be updated based on patient status, additional functional criteria and insurance authorization.    Recommendations  Diet recommendations: Dysphagia 3 (mechanical soft);Thin liquid Liquids provided via: Straw;Cup Medication Administration: Whole meds with puree Supervision: Staff to assist with self feeding Compensations: Minimize environmental distractions                Oral Care Recommendations: Oral care BID Follow up Recommendations: None SLP Visit Diagnosis: Dysphagia, unspecified (R13.10) Plan: All goals met       GO               Akio Hudnall L.  Tivis Ringer, [place] Office number [phone] Pager [phone]  Andrea Yu  11/07/2020, 3:02 PM

## 2020-11-07 NOTE — Progress Notes (Signed)
    Transesophageal Echocardiogram Note  Andrea Yu [id number] 1940/05/28  Procedure: Transesophageal Echocardiogram Indications: Bacteremia  Procedure Details Consent: Obtained Time Out: Verified patient identification, verified procedure, site/side was marked, verified correct patient position, special equipment/implants available, Radiology Safety Procedures followed,  medications/allergies/relevent history reviewed, required imaging and test results available.  Performed  Medications:  Pt sedated by anesthesia with diprovan 220 mg IV total.   Normal LV function; moderate LAE; no LAA thrombus; sclerotic aortic valve with mild AI; trace MR, TR and PI; positive PFO; no vegetations.  Complications: No apparent complications Patient did tolerate procedure well.  Olga Millers, MD

## 2020-11-07 NOTE — Progress Notes (Signed)
   11/07/20 1005  Assess: MEWS Score  Temp (!) 101.5 F (38.6 C)  BP 103/60  Pulse Rate (!) 122  ECG Heart Rate (!) 108  Resp (!) 23  Level of Consciousness Alert  SpO2 100 %  O2 Device Room Air  Assess: MEWS Score  MEWS Temp 2  MEWS Systolic 0  MEWS Pulse 1  MEWS RR 1  MEWS LOC 0  MEWS Score 4  MEWS Score Color Red  Treat  Pain Scale CPOT  Facial Expression 0  Body Movements 1  Muscle Tension 0  Compliance with ventilator (intubated pts.) N/A  Vocalization (extubated pts.) 1  CPOT Total 2  Take Vital Signs  Increase Vital Sign Frequency  Red: Q 1hr X 4 then Q 4hr X 4, if remains red, continue Q 4hrs  Escalate  MEWS: Escalate Red: discuss with charge nurse/RN and provider, consider discussing with RRT  Notify: Charge Nurse/RN  Name of Charge Nurse/RN Notified Nego RN  Date Charge Nurse/RN Notified 11/07/20  Time Charge Nurse/RN Notified 1005  Patient returned to 4E22 from Endo. Vital signs recorded. Protocol implemented.

## 2020-11-07 NOTE — Progress Notes (Signed)
Physical Therapy Treatment Patient Details Name: Andrea Yu MRN: [id number] DOB: Sep 06, 1940 Today's Date: 11/07/2020   History of Present Illness Pt is an 80 y.o. female admitted 10/29/20 with confusion, decreased oral intake, urinary frequency; of note, pt with recent UTI. Workup for sepsis secondary to UTI. Course complicated by continue AMS, unclear source of fevers; TEE 10/14 with no evidence of endocarditis. PMH includes PAF, neuropathy, anemia, SAH, HTN, obesity.   PT Comments    Pt progressing with mobility. Today's session focused on transfer training, pt requiring up to modA+2 for stand pivot transfer. Pt more alert, still with confusion, requiring frequent cues to attend to and complete task. Pt limited by generalized weakness, decreased activity tolerance, poor balance strategies and impaired cognition. Pt's caregiver present at beginning of session; reports pt typically supervision-level for standing transfers and taking steps. Continue to recommend SNF-level therapies to maximize functional mobility and independence prior to return home; caregiver reports family hopeful for short-term rehab at [facility].    Recommendations for follow up therapy are one component of a multi-disciplinary discharge planning process, led by the attending physician.  Recommendations may be updated based on patient status, additional functional criteria and insurance authorization.  Follow Up Recommendations  SNF;Supervision for mobility/OOB     Equipment Recommendations  None recommended by PT    Recommendations for Other Services       Precautions / Restrictions Precautions Precautions: Fall;Other (comment) Precaution Comments: bladder/bowel incontinence Restrictions Weight Bearing Restrictions: No     Mobility  Bed Mobility Overal bed mobility: Needs Assistance Bed Mobility: Supine to Sit     Supine to sit: Mod assist;HOB elevated          Transfers Overall transfer level: Needs  assistance Equipment used: 2 person hand held assist Transfers: Stand Pivot Transfers   Stand pivot transfers: Mod assist;+2 physical assistance       General transfer comment: ModA+2 for stand pivot to recliner, able to assist with BUE support; attempting BLE steps, noted instability  Ambulation/Gait                 Stairs             Wheelchair Mobility    Modified Rankin (Stroke Patients Only)       Balance Overall balance assessment: Needs assistance Sitting-balance support: No upper extremity supported;Feet supported Sitting balance-Leahy Scale: Fair     Standing balance support: Bilateral upper extremity supported;During functional activity Standing balance-Leahy Scale: Poor Standing balance comment: Reliant on BUE support and external assist                            Cognition Arousal/Alertness: Awake/alert Behavior During Therapy: WFL for tasks assessed/performed;Flat affect Overall Cognitive Status: No family/caregiver present to determine baseline cognitive functioning                                 General Comments: More alert and interactive, still with some confusion, but answering more questions and joking appropriately; requires frequent redirection and cues to complete task, simple commands with increased time      Exercises      General Comments General comments (skin integrity, edema, etc.): HR 50s-70s during session, SpO2 >/96% on RA. Pt aware of bowel incontinence, dependent for pericare      Pertinent Vitals/Pain Pain Assessment: Faces Faces Pain Scale: Hurts little more Pain Location: R  arm Pain Descriptors / Indicators: Discomfort;Grimacing;Guarding Pain Intervention(s): Monitored during session;Limited activity within patient's tolerance    Home Living                      Prior Function            PT Goals (current goals can now be found in the care plan section) Progress towards  PT goals: Progressing toward goals    Frequency    Min 2X/week      PT Plan Current plan remains appropriate    Co-evaluation              AM-PAC PT "6 Clicks" Mobility   Outcome Measure  Help needed turning from your back to your side while in a flat bed without using bedrails?: A Little Help needed moving from lying on your back to sitting on the side of a flat bed without using bedrails?: A Lot Help needed moving to and from a bed to a chair (including a wheelchair)?: Total Help needed standing up from a chair using your arms (e.g., wheelchair or bedside chair)?: Total Help needed to walk in hospital room?: Total Help needed climbing 3-5 steps with a railing? : Total 6 Click Score: 9    End of Session Equipment Utilized During Treatment: Gait belt Activity Tolerance: Patient tolerated treatment well;Patient limited by fatigue Patient left: in chair;with call bell/phone within reach;with chair alarm set Nurse Communication: Mobility status PT Visit Diagnosis: Other abnormalities of gait and mobility (R26.89);Muscle weakness (generalized) (M62.81)     Time: 1610-9604 PT Time Calculation (min) (ACUTE ONLY): 34 min  Charges:  $Therapeutic Activity: 23-37 mins                     Andrea Yu, PT, DPT Acute Rehabilitation Services  Pager [phone] Office [phone]  Malachy Chamber 11/07/2020, 5:20 PM

## 2020-11-07 NOTE — Progress Notes (Signed)
Pt had a slight temp of 100.9 F this AM and her heart rate started going up to mid 120s. She complained of being so cold and shaking.  She was more disoriented and uncooperative than she had been earlier in the shift. Tylenol 650 mg was given at 0507.  Rechecked vitals at 0629. Temp was 101.0 F.  HR at 122. BP 115/74. RR 19. O2 of 97% on room air.  Blood glucose level was 84.  Increased Cardizem drip to 10 mL/hr.  Informed on call hospitalist.  Will continue to monitor.  Harriet Masson, RN

## 2020-11-07 NOTE — Transfer of Care (Signed)
Immediate Anesthesia Transfer of Care Note  Patient: Andrea Yu  Procedure(s) Performed: TRANSESOPHAGEAL ECHOCARDIOGRAM (TEE) BUBBLE STUDY  Patient Location: Endoscopy Unit  Anesthesia Type:MAC  Level of Consciousness: drowsy  Airway & Oxygen Therapy: Patient Spontanous Breathing and Patient connected to nasal cannula oxygen  Post-op Assessment: Report given to RN and Post -op Vital signs reviewed and unstable, Anesthesiologist notified  Post vital signs: Reviewed  Last Vitals:  Vitals Value Taken Time  BP 88/43 11/07/20 0930  Temp    Pulse 101 11/07/20 0931  Resp 26 11/07/20 0931  SpO2 99 % 11/07/20 0931  Vitals shown include unvalidated device data.  Last Pain:  Vitals:   11/07/20 0749  TempSrc: Temporal  PainSc:       Patients Stated Pain Goal: 0 (16/07/37 1062)  Complications: No notable events documented.

## 2020-11-07 NOTE — Progress Notes (Signed)
Patient to endo 

## 2020-11-07 NOTE — Progress Notes (Signed)
PROGRESS NOTE  [name]:[id number] DOB: December 24, 1940 DOA: 10/29/2020 PCP: Jarome Matin, MD  HPI/Recap of past 63 hours:  80 year old with past medical history significant for paroxysmal A. fib, GERD, neuropathy, morbid obesity, psoriatic arthritis, iron deficiency anemia, history of subarachnoid hemorrhage, hypertension who presents to the ED complaining of the urinary frequency and decreased oral intake.  Prescribed Bactrim by her PCP for UTI prior to her presentation.  She completed course and despite still continued to have dysuria.  Noted to be slightly confused.  Work-up in the ED revealed leukocytosis, tachycardia, tachypnea, fever, hypotension.  UA positive for pyuria.  CT abdomen and pelvis showing fluid collection inferior portion of the right buttock region inferior to the right ischial tuberosity.  Blood cultures obtained on 10/29/2020 positive for Streptococcus parasanguinis 1 out of 4 bottles, thought to be a contaminant.  Hospital course complicated by recurrent fevers.  Followed by infectious disease.  1 episode of altered mental status on 11/06/2020, returned to baseline.  She had a TEE completed on 11/07/2020, which was negative for endocarditis.  11/07/2020: Patient was seen and examined at her bedside.  She is drowsy however she is easily arousable to voices and follows commands.  She had another fever today, T-max of 101.5.  Discussed case with infectious disease Dr. Elinor Parkinson who recommended pan CT scanning, CT chest, abdomen pelvis with contrast.  If negative will discontinue IV antibiotics.  Also recommended general surgery assessment of the fluid collection seen on CT scan for possible aspiration and analysis of body fluid.     Assessment/Plan: Principal Problem:   Severe sepsis (HCC) Active Problems:   Adrenal insufficiency (HCC)   PAF (paroxysmal atrial fibrillation) (HCC)   Psoriatic arthritis (HCC)   Iron deficiency anemia- transfused this admission    Depression   Peripheral neuropathy   Obesity (BMI 30-39.9)- suspected sleep apnea, declines sleep study   SAH (subarachnoid hemorrhage) (HCC)   Essential hypertension   GERD (gastroesophageal reflux disease)   Bacteremia  Sepsis of unclear etiology/recurrent fevers of unknown cause Urine culture grew multiple species and suggested recollection. Blood cultures obtained on 10/29/2020 positive for Streptococcus parasanguinis 1 out of 4 bottles, thought to be a contaminant. Repeated blood cultures drawn on 11/04/2020 negative to date. CT scan chest/abdomen/pelvis with contrast ordered on 11/07/2020 due to no clear source of infection. Currently on IV vancomycin  6.3 cm fluid collection seen in the inferior portion of the right buttocks, rule out an abscess Surgery reconsulted for possible aspiration and fluid analysis  Acute metabolic encephalopathy CT head done on 11/03/2020 showed chronic small vessel disease without acute intracranial abnormality Reorient as needed Fall precautions  Hypokalemia Repleted intravenously. Repeat BMP in the morning Obtain magnesium level in the morning.  Mild hypovolemic hyponatremia Serum sodium 134 Normal saline at 30 cc/h to avoid contrast-induced nephropathy.  Chronic diastolic CHF Euvolemic on exam BNP 471 Use IV fluid judiciously, currently on normal saline at 30 cc/h x 2 days due to IV contrast. Closely monitor volume status while on IV fluid Continue strict I's and O's and daily weight  Chronic anxiety/depression Continue home duloxetine  GERD Continue home PPI  Code Status: Full code  Family Communication: None at bedside  Disposition Plan: Likely will DC to SNF.   Consultants: Infectious disease Cardiology  Procedures: TEE  Antimicrobials: IV vancomycin  DVT prophylaxis: Subcu Lovenox daily.  Status is: Inpatient          Objective: Vitals:   11/07/20 1112 11/07/20 1121 11/07/20  1214 11/07/20 1329  BP:   (!) 115/56 (!) 101/49 (!) 115/50  Pulse: 94 84 82 69  Resp:  14 (!) 22 (!) 26  Temp:  98.4 F (36.9 C) (!) 100.9 F (38.3 C) 99.1 F (37.3 C)  TempSrc:  Oral Axillary Axillary  SpO2:  100% 96% 97%  Weight:      Height:        Intake/Output Summary (Last 24 hours) at 11/07/2020 1520 Last data filed at 11/07/2020 1111 Gross per 24 hour  Intake 701.18 ml  Output 1800 ml  Net -1098.82 ml   Filed Weights   10/30/20 0700 11/04/20 0327  Weight: 83 kg 86.9 kg    Exam:  General: 80 y.o. year-old female well developed well nourished in no acute distress.  Drowsy but easily arousable to voices.  Follows commands. Cardiovascular: Regular rate and rhythm with no rubs or gallops.  No thyromegaly or JVD noted.   Respiratory: Clear to auscultation with no wheezes or rales.  Poor inspiratory effort. Abdomen: Soft obese nontender nondistended with normal bowel sounds x4 quadrants. Musculoskeletal: Trace lower extremity edema.  Charcot feet.  Skin: No ulcerative lesions noted or rashes. Psychiatry: Mood is appropriate for condition and setting   Data Reviewed: CBC: Recent Labs  Lab 11/01/20 0753 11/02/20 0746 11/03/20 0907 11/04/20 0331 11/06/20 0451  WBC 14.7* 13.2* 11.1* 10.5 10.3  HGB 9.7* 10.1* 9.8* 10.1* 10.8*  HCT 30.0* 32.0* 30.4* 31.0* 33.2*  MCV 86.5 88.4 85.2 84.5 86.0  PLT 217 231 293 320 314   Basic Metabolic Panel: Recent Labs  Lab 11/01/20 0753 11/02/20 0746 11/03/20 0907 11/04/20 0331 11/05/20 0457 11/06/20 0451  NA 131* 130* 133* 135  --  134*  K 3.5 3.5 2.9* 3.7  --  3.3*  CL 101 101 103 108  --  107  CO2 18* 18* 19* 18*  --  18*  GLUCOSE 78 77 90 112*  --  87  BUN 9 7* 9 8  --  8  CREATININE 0.96 1.09* 1.02* 0.95 0.91 0.91  CALCIUM 8.0* 8.1* 8.1* 8.2*  --  8.4*  MG  --   --  1.7  --   --   --    GFR: Estimated Creatinine Clearance: 46.5 mL/min (by C-G formula based on SCr of 0.91 mg/dL). Liver Function Tests: Recent Labs  Lab 11/05/20 0457   AST 20  ALT 16  ALKPHOS 70  BILITOT 0.6  PROT 5.4*  ALBUMIN 2.0*   No results for input(s): LIPASE, AMYLASE in the last 168 hours. No results for input(s): AMMONIA in the last 168 hours. Coagulation Profile: No results for input(s): INR, PROTIME in the last 168 hours. Cardiac Enzymes: No results for input(s): CKTOTAL, CKMB, CKMBINDEX, TROPONINI in the last 168 hours. BNP (last 3 results) No results for input(s): PROBNP in the last 8760 hours. HbA1C: No results for input(s): HGBA1C in the last 72 hours. CBG: Recent Labs  Lab 11/04/20 0517 11/07/20 0645  GLUCAP 126* 84   Lipid Profile: No results for input(s): CHOL, HDL, LDLCALC, TRIG, CHOLHDL, LDLDIRECT in the last 72 hours. Thyroid Function Tests: No results for input(s): TSH, T4TOTAL, FREET4, T3FREE, THYROIDAB in the last 72 hours. Anemia Panel: No results for input(s): VITAMINB12, FOLATE, FERRITIN, TIBC, IRON, RETICCTPCT in the last 72 hours. Urine analysis:    Component Value Date/Time   COLORURINE YELLOW 11/04/2020 0550   APPEARANCEUR HAZY (A) 11/04/2020 0550   LABSPEC 1.013 11/04/2020 0550   PHURINE 6.0 11/04/2020  0550   GLUCOSEU NEGATIVE 11/04/2020 0550   HGBUR MODERATE (A) 11/04/2020 0550   BILIRUBINUR NEGATIVE 11/04/2020 0550   KETONESUR NEGATIVE 11/04/2020 0550   PROTEINUR 30 (A) 11/04/2020 0550   UROBILINOGEN 0.2 09/14/2014 1637   NITRITE NEGATIVE 11/04/2020 0550   LEUKOCYTESUR LARGE (A) 11/04/2020 0550   Sepsis Labs: @LABRCNTIP (procalcitonin:4,lacticidven:4)  ) Recent Results (from the past 240 hour(s))  Blood Culture (routine x 2)     Status: Abnormal (Preliminary result)   Collection Time: 10/29/20 10:45 AM   Specimen: BLOOD  Result Value Ref Range Status   Specimen Description BLOOD BLOOD RIGHT FOREARM  Final   Special Requests   Final    BOTTLES DRAWN AEROBIC AND ANAEROBIC Blood Culture adequate volume   Culture  Setup Time   Final    GRAM POSITIVE COCCI AEROBIC BOTTLE ONLY CRITICAL RESULT  CALLED TO, READ BACK BY AND VERIFIED WITH: HURTH PHARMD @0920  10/31/20 EB    Culture (A)  Final    STREPTOCOCCUS PARASANGUINIS Sent to [facility] for further susceptibility testing. Performed at [facility], [address], [place], [address] [place]    Report Status PENDING  Incomplete   Organism ID, Bacteria STREPTOCOCCUS PARASANGUINIS  Final      Susceptibility   Streptococcus parasanguinis - MIC*    PENICILLIN 2 INTERMEDIATE Intermediate     CEFTRIAXONE 1 SENSITIVE Sensitive     LEVOFLOXACIN >=16 RESISTANT Resistant     VANCOMYCIN 0.5 SENSITIVE Sensitive     * STREPTOCOCCUS PARASANGUINIS  Blood Culture ID Panel (Reflexed)     Status: Abnormal   Collection Time: 10/29/20 10:45 AM  Result Value Ref Range Status   Enterococcus faecalis NOT DETECTED NOT DETECTED Final   Enterococcus Faecium NOT DETECTED NOT DETECTED Final   Listeria monocytogenes NOT DETECTED NOT DETECTED Final   Staphylococcus species NOT DETECTED NOT DETECTED Final   Staphylococcus aureus (BCID) NOT DETECTED NOT DETECTED Final   Staphylococcus epidermidis NOT DETECTED NOT DETECTED Final   Staphylococcus lugdunensis NOT DETECTED NOT DETECTED Final   Streptococcus species DETECTED (A) NOT DETECTED Final    Comment: Not Enterococcus species, Streptococcus agalactiae, Streptococcus pyogenes, or Streptococcus pneumoniae. CRITICAL RESULT CALLED TO, READ BACK BY AND VERIFIED WITH: HURTH PHARMD @0920  10/31/20 EB    Streptococcus agalactiae NOT DETECTED NOT DETECTED Final   Streptococcus pneumoniae NOT DETECTED NOT DETECTED Final   Streptococcus pyogenes NOT DETECTED NOT DETECTED Final   A.calcoaceticus-baumannii NOT DETECTED NOT DETECTED Final   Bacteroides fragilis NOT DETECTED NOT DETECTED Final   Enterobacterales NOT DETECTED NOT DETECTED Final   Enterobacter cloacae complex NOT DETECTED NOT DETECTED Final   Escherichia coli NOT DETECTED NOT DETECTED Final   Klebsiella aerogenes NOT DETECTED NOT DETECTED Final    Klebsiella oxytoca NOT DETECTED NOT DETECTED Final   Klebsiella pneumoniae NOT DETECTED NOT DETECTED Final   Proteus species NOT DETECTED NOT DETECTED Final   Salmonella species NOT DETECTED NOT DETECTED Final   Serratia marcescens NOT DETECTED NOT DETECTED Final   Haemophilus influenzae NOT DETECTED NOT DETECTED Final   Neisseria meningitidis NOT DETECTED NOT DETECTED Final   Pseudomonas aeruginosa NOT DETECTED NOT DETECTED Final   Stenotrophomonas maltophilia NOT DETECTED NOT DETECTED Final   Candida albicans NOT DETECTED NOT DETECTED Final   Candida auris NOT DETECTED NOT DETECTED Final   Candida glabrata NOT DETECTED NOT DETECTED Final   Candida krusei NOT DETECTED NOT DETECTED Final   Candida parapsilosis NOT DETECTED NOT DETECTED Final   Candida tropicalis NOT DETECTED  NOT DETECTED Final   Cryptococcus neoformans/gattii NOT DETECTED NOT DETECTED Final    Comment: Performed at [facility], [address], [place], [place] [address]  Resp Panel by RT-PCR (Flu A&B, Covid)     Status: None   Collection Time: 10/29/20  2:22 PM   Specimen: Nasopharyngeal(NP) swabs in vial transport medium  Result Value Ref Range Status   SARS Coronavirus 2 by RT PCR NEGATIVE NEGATIVE Final    Comment: (NOTE) SARS-CoV-2 target nucleic acids are NOT DETECTED.  The SARS-CoV-2 RNA is generally detectable in upper respiratory specimens during the acute phase of infection. The lowest concentration of SARS-CoV-2 viral copies this assay can detect is 138 copies/mL. A negative result does not preclude SARS-Cov-2 infection and should not be used as the sole basis for treatment or other patient management decisions. A negative result may occur with  improper specimen collection/handling, submission of specimen other than nasopharyngeal swab, presence of viral mutation(s) within the areas targeted by this assay, and inadequate number of viral copies(<138 copies/mL). A negative result must be combined  with clinical observations, patient history, and epidemiological information. The expected result is Negative.  Fact Sheet for Patients:  BloggerCourse.com  Fact Sheet for Healthcare Providers:  SeriousBroker.it  This test is no t yet approved or cleared by the [place] FDA and  has been authorized for detection and/or diagnosis of SARS-CoV-2 by FDA under an Emergency Use Authorization (EUA). This EUA will remain  in effect (meaning this test can be used) for the duration of the COVID-19 declaration under Section 564(b)(1) of the Act, 21 U.S.C.section 360bbb-3(b)(1), unless the authorization is terminated  or revoked sooner.       Influenza A by PCR NEGATIVE NEGATIVE Final   Influenza B by PCR NEGATIVE NEGATIVE Final    Comment: (NOTE) The Xpert Xpress SARS-CoV-2/FLU/RSV plus assay is intended as an aid in the diagnosis of influenza from Nasopharyngeal swab specimens and should not be used as a sole basis for treatment. Nasal washings and aspirates are unacceptable for Xpert Xpress SARS-CoV-2/FLU/RSV testing.  Fact Sheet for Patients: BloggerCourse.com  Fact Sheet for Healthcare Providers: SeriousBroker.it  This test is not yet approved or cleared by the [place] FDA and has been authorized for detection and/or diagnosis of SARS-CoV-2 by FDA under an Emergency Use Authorization (EUA). This EUA will remain in effect (meaning this test can be used) for the duration of the COVID-19 declaration under Section 564(b)(1) of the Act, 21 U.S.C. section 360bbb-3(b)(1), unless the authorization is terminated or revoked.  Performed at [facility], [address], [place], [place] [address]   Blood Culture (routine x 2)     Status: None   Collection Time: 10/29/20  2:28 PM   Specimen: BLOOD  Result Value Ref Range Status   Specimen Description BLOOD SITE NOT  SPECIFIED  Final   Special Requests   Final    BOTTLES DRAWN AEROBIC AND ANAEROBIC Blood Culture adequate volume   Culture   Final    NO GROWTH 5 DAYS Performed at [facility], [address], [place], [place] [address]    Report Status 11/03/2020 FINAL  Final  Urine Culture     Status: Abnormal   Collection Time: 10/29/20  3:45 PM   Specimen: In/Out Cath Urine  Result Value Ref Range Status   Specimen Description IN/OUT CATH URINE  Final   Special Requests   Final    NONE Performed at [facility]  Lab, [address] [place], [place] [address]    Culture MULTIPLE SPECIES PRESENT, SUGGEST RECOLLECTION (A)  Final   Report Status 10/30/2020 FINAL  Final  Culture, blood (routine x 2)     Status: None   Collection Time: 10/31/20  2:30 PM   Specimen: BLOOD  Result Value Ref Range Status   Specimen Description BLOOD RIGHT ANTECUBITAL  Final   Special Requests   Final    BOTTLES DRAWN AEROBIC ONLY Blood Culture results may not be optimal due to an inadequate volume of blood received in culture bottles   Culture   Final    NO GROWTH 5 DAYS Performed at [facility], [address] [place], [place] [address]    Report Status 11/05/2020 FINAL  Final  Culture, blood (routine x 2)     Status: None   Collection Time: 10/31/20  2:35 PM   Specimen: BLOOD RIGHT HAND  Result Value Ref Range Status   Specimen Description BLOOD RIGHT HAND  Final   Special Requests   Final    BOTTLES DRAWN AEROBIC ONLY Blood Culture results may not be optimal due to an inadequate volume of blood received in culture bottles   Culture   Final    NO GROWTH 5 DAYS Performed at [facility], [address], [place], [place] [address]    Report Status 11/05/2020 FINAL  Final  Urine Culture     Status: None   Collection Time: 11/04/20  5:50 AM   Specimen: Urine, Clean Catch  Result Value Ref Range Status   Specimen Description URINE, CLEAN CATCH  Final   Special Requests NONE  Final    Culture   Final    NO GROWTH Performed at [facility], [address] [place], [place] [address]    Report Status 11/04/2020 FINAL  Final  Culture, blood (routine x 2)     Status: None (Preliminary result)   Collection Time: 11/04/20  7:51 AM   Specimen: BLOOD LEFT HAND  Result Value Ref Range Status   Specimen Description BLOOD LEFT HAND  Final   Special Requests   Final    BOTTLES DRAWN AEROBIC AND ANAEROBIC Blood Culture adequate volume   Culture   Final    NO GROWTH 3 DAYS Performed at [facility], [address] [place], [place] [address]    Report Status PENDING  Incomplete  Culture, blood (routine x 2)     Status: None (Preliminary result)   Collection Time: 11/04/20  8:00 AM   Specimen: BLOOD RIGHT HAND  Result Value Ref Range Status   Specimen Description BLOOD RIGHT HAND  Final   Special Requests   Final    BOTTLES DRAWN AEROBIC AND ANAEROBIC Blood Culture adequate volume   Culture   Final    NO GROWTH 3 DAYS Performed at [facility] Lab, [address], [place], [place] [address]    Report Status PENDING  Incomplete      Studies: ECHO TEE  Result Date: 11/07/2020    TRANSESOPHOGEAL ECHO REPORT   Patient Name:   Andrea Yu Date of Exam: 11/07/2020 Medical Rec #:  [id number]     Height:       59.0 in Accession #:    [id number]    Weight:       191.6 lb Date of Birth:  Apr 18, 1940    BSA:          1.811 m  Patient Age:    79 years      BP:           116/44 mmHg Patient Gender: F             HR:           83 bpm. Exam Location:  Inpatient Procedure: Transesophageal Echo, Cardiac Doppler, Color Doppler and Saline            Contrast Bubble Study Indications:     Bacteremia  History:         Patient has prior history of Echocardiogram examinations, most                  recent 11/01/2020. Arrythmias:Atrial Fibrillation; Risk                  Factors:Hypertension. GERD.  Sonographer:     Ross Ludwig RDCS (AE) Referring Phys:  [id number] Madolyn Frieze CRENSHAW Diagnosing  Phys: Olga Millers MD PROCEDURE: After discussion of the risks and benefits of a TEE, an informed consent was obtained from the patient. The transesophogeal probe was passed without difficulty through the esophogus of the patient. Sedation performed by different physician. The patient was monitored while under deep sedation. Anesthestetic sedation was provided intravenously by Anesthesiology: 220mg  of Propofol. Image quality was good. The patient developed no complications during the procedure. IMPRESSIONS  1. No vegetations noted.  2. Left ventricular ejection fraction, by estimation, is 55 to 60%. The left ventricle has normal function. The left ventricle has no regional wall motion abnormalities.  3. Right ventricular systolic function is normal. The right ventricular size is normal.  4. Left atrial size was moderately dilated. No left atrial/left atrial appendage thrombus was detected.  5. The mitral valve is normal in structure. Trivial mitral valve regurgitation.  6. The aortic valve is tricuspid. Aortic valve regurgitation is mild.  7. 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  8. Evidence of atrial level shunting detected by color flow Doppler. Agitated saline contrast bubble study was positive with shunting observed within 3-6 cardiac cycles suggestive of interatrial shunt. There is a small patent foramen ovale. FINDINGS  Left Ventricle: Left ventricular ejection fraction, by estimation, is 55 to 60%. The left ventricle has normal function. The left ventricle has no regional wall motion abnormalities. The left ventricular internal cavity size was normal in size. Right Ventricle: The right ventricular size is normal. Right ventricular systolic function is normal. Left Atrium: Left atrial size was moderately dilated. No left atrial/left atrial appendage thrombus was detected. Right Atrium: Right atrial size was normal in size. Pericardium: There is no evidence of pericardial  effusion. Mitral Valve: The mitral valve is normal in structure. Trivial mitral valve regurgitation. Tricuspid Valve: The tricuspid valve is normal in structure. Tricuspid valve regurgitation is trivial. Aortic Valve: The aortic valve is tricuspid. Aortic valve regurgitation is mild. Pulmonic Valve: The pulmonic valve was normal in structure. Pulmonic valve regurgitation is trivial. Aorta: The aortic root is normal in size and structure. There is moderate (Grade III) plaque involving the descending aorta. IAS/Shunts: Evidence of atrial level shunting detected by color flow Doppler. Agitated saline contrast was given intravenously to evaluate for intracardiac shunting. Agitated saline contrast bubble study was positive with shunting observed within 3-6 cardiac cycles suggestive of interatrial shunt. A small patent foramen ovale is detected. Additional Comments: No vegetations noted.   AORTA Ao Root diam: 2.80 cm Ao Asc diam:  2.80 cm TRICUSPID VALVE  TR Peak grad:   16.2 mmHg TR Vmax:        201.00 cm/s Olga Millers MD Electronically signed by Olga Millers MD Signature Date/Time: 11/07/2020/1:03:44 PM    Final     Scheduled Meds:  aspirin EC  81 mg Oral QPM   docusate sodium  100 mg Oral BID   DULoxetine  90 mg Oral Daily   enoxaparin (LOVENOX) injection  40 mg Subcutaneous Q24H   metoprolol succinate  50 mg Oral Daily   nystatin  5 mL Oral QID   pantoprazole  40 mg Oral Daily    Continuous Infusions:  diltiazem (CARDIZEM) infusion 10 mg/hr (11/07/20 1111)   potassium chloride 10 mEq (11/07/20 1504)   vancomycin 750 mg (11/07/20 1059)     LOS: 9 days     Darlin Drop, MD [facility] Pager [phone]  If 7PM-7AM, please contact night-coverage [email or web address] Password [email or web address] 11/07/2020, 3:20 PM

## 2020-11-07 NOTE — Progress Notes (Signed)
PT Cancellation Note  Patient Details Name: Andrea Yu MRN: [id number] DOB: 1940-09-18   Cancelled Treatment:    Reason Eval/Treat Not Completed: Patient at procedure or test/unavailable (endo). Will follow-up for PT treatment as schedule permits.  Ina Homes, PT, DPT Acute Rehabilitation Services  Pager [phone] Office [phone]  Malachy Chamber 11/07/2020, 8:29 AM

## 2020-11-07 NOTE — Anesthesia Postprocedure Evaluation (Signed)
Anesthesia Post Note  Patient: Andrea Yu  Procedure(s) Performed: TRANSESOPHAGEAL ECHOCARDIOGRAM (TEE) BUBBLE STUDY     Patient location during evaluation: Endoscopy Anesthesia Type: MAC Level of consciousness: awake and alert Pain management: pain level controlled Vital Signs Assessment: post-procedure vital signs reviewed and stable Respiratory status: spontaneous breathing, nonlabored ventilation and respiratory function stable Cardiovascular status: blood pressure returned to baseline and stable Postop Assessment: no apparent nausea or vomiting Anesthetic complications: no   No notable events documented.  Last Vitals:  Vitals:   11/07/20 0946 11/07/20 0949  BP: (!) 137/46 (!) 135/41  Pulse: (!) 105   Resp: 18   Temp:    SpO2: 99%     Last Pain:  Vitals:   11/07/20 0930  TempSrc: Temporal  PainSc:                  Lynda Rainwater

## 2020-11-07 NOTE — Progress Notes (Signed)
[facility] Infectious Diseases Follow Up Note  Patient Identification: Patient Name: Andrea Yu MRN: [id number] Admit Date: 10/29/2020  1:41 PM Age: 80 y.o.Today's Date: 11/07/2020   Reason for Visit: Streptococcus bacteremia/Fevers  Principal Problem:   Severe sepsis [facility]) Active Problems:   Adrenal insufficiency (HCC)   PAF (paroxysmal atrial fibrillation) (HCC)   Psoriatic arthritis (HCC)   Iron deficiency anemia- transfused this admission   Depression   Peripheral neuropathy   Obesity (BMI 30-39.9)- suspected sleep apnea, declines sleep study   SAH (subarachnoid hemorrhage) (HCC)   Essential hypertension   GERD (gastroesophageal reflux disease)   Bacteremia   Antibiotics: Aztreonam 10/5-10/6                    Metronidazole 10/5                    Vancomycin 10/5, 10/12-current                     Ceftriaxone 10/7- 10/11                     Lines/Hardware:   Interval Events: T max yesterday 103, no leukocytosis. TEE with no evidence of endocarditis   Assessment:  Intermittent fevers with unclear cause  - Unclear source of fever at this point.  - TEE negative for endocarditis/vegetations, Interatrial shunt + - Vascular duplex of LE negative for DVT  - Ceftriaxone changed to Vancomycin for concerns of drug related fevers although she has fevers during presentation and less likely antibiotic related  - No signs of phlebitis on exam. No central lines and no diarrhea reported per RN - She has fluid collection in the RT buttock in CT abd/pelvis which has not been drained and I am not sure if this is causing fevers although it seems to be chronic  Streptococcal Parasanguinis 1/4 bottles - likely a contaminant   Recommendations -Discussed with primary Dr Margo Aye her clinical presentation and low suspicion for infective cause with unremarkable extesive work up except fluid collection in the rt buttock that has not been  drained. I would recommend to get a CT chest abdomen pelvis to look for any occult sources of infection/abscess and also ask surgery/IR to aspirate the rt buttock fluid collection and send for gram stain and aerobic/anaerobic cultures   -Continue Vancomycin pending above and will likely DC abtx thereafter if nothing remarkable  -Monitor fever curve, WBC count and vancomcyin trough   Rest of the management as per the primary team. Thank you for the consult. Please page with pertinent questions or concerns.  ______________________________________________________________________ Subjective patient seen and examined at the bedside. She is lying in the bed post TEE. She is awake, somewhat drowsy but oriented and following commands    Vitals BP (!) 115/50 (BP Location: Right Arm)   Pulse 69   Temp 99.1 F (37.3 C) (Axillary)   Resp (!) 26   Ht 4\' 11"  (1.499 m)   Wt 86.9 kg   SpO2 97%   BMI 38.69 kg/m     Physical Exam Constitutional:  Not in acute distress, lying in the bed.     Comments:   Cardiovascular:     Rate and Rhythm: Normal rate and regular rhythm.     Heart sounds:   Pulmonary:     Effort: Pulmonary effort is normal.     Comments:   Abdominal:     Palpations: Abdomen is soft.  Tenderness: Non tender and non distended   Musculoskeletal:        General: No peripheral joint swelling and tenderness  Skin:    Comments: No obvious lesions or rashes. No wounds at the back. Rt buttock with no signs of inflammation and fluctuance  Neurological:     General: able to move all extremities spontaneously  Psychiatric:        Mood and Affect: Mood normal. Awake, oriented to name, place, month and year   Pertinent Microbiology Results for orders placed or performed during the hospital encounter of 10/29/20  Blood Culture (routine x 2)     Status: Abnormal (Preliminary result)   Collection Time: 10/29/20 10:45 AM   Specimen: BLOOD  Result Value Ref Range Status    Specimen Description BLOOD BLOOD RIGHT FOREARM  Final   Special Requests   Final    BOTTLES DRAWN AEROBIC AND ANAEROBIC Blood Culture adequate volume   Culture  Setup Time   Final    GRAM POSITIVE COCCI AEROBIC BOTTLE ONLY CRITICAL RESULT CALLED TO, READ BACK BY AND VERIFIED WITH: HURTH PHARMD  10/31/20 EB    Culture (A)  Final    STREPTOCOCCUS PARASANGUINIS Sent to [facility] for further susceptibility testing. Performed at [facility], [address], [place], [place] [address]    Report Status PENDING  Incomplete   Organism ID, Bacteria STREPTOCOCCUS PARASANGUINIS  Final      Susceptibility   Streptococcus parasanguinis - MIC*    PENICILLIN 2 INTERMEDIATE Intermediate     CEFTRIAXONE 1 SENSITIVE Sensitive     LEVOFLOXACIN >=16 RESISTANT Resistant     VANCOMYCIN 0.5 SENSITIVE Sensitive     * STREPTOCOCCUS PARASANGUINIS  Blood Culture ID Panel (Reflexed)     Status: Abnormal   Collection Time: 10/29/20 10:45 AM  Result Value Ref Range Status   Enterococcus faecalis NOT DETECTED NOT DETECTED Final   Enterococcus Faecium NOT DETECTED NOT DETECTED Final   Listeria monocytogenes NOT DETECTED NOT DETECTED Final   Staphylococcus species NOT DETECTED NOT DETECTED Final   Staphylococcus aureus (BCID) NOT DETECTED NOT DETECTED Final   Staphylococcus epidermidis NOT DETECTED NOT DETECTED Final   Staphylococcus lugdunensis NOT DETECTED NOT DETECTED Final   Streptococcus species DETECTED (A) NOT DETECTED Final    Comment: Not Enterococcus species, Streptococcus agalactiae, Streptococcus pyogenes, or Streptococcus pneumoniae. CRITICAL RESULT CALLED TO, READ BACK BY AND VERIFIED WITH: HURTH PHARMD  10/31/20 EB    Streptococcus agalactiae NOT DETECTED NOT DETECTED Final   Streptococcus pneumoniae NOT DETECTED NOT DETECTED Final   Streptococcus pyogenes NOT DETECTED NOT DETECTED Final   A.calcoaceticus-baumannii NOT DETECTED NOT DETECTED Final   Bacteroides fragilis NOT  DETECTED NOT DETECTED Final   Enterobacterales NOT DETECTED NOT DETECTED Final   Enterobacter cloacae complex NOT DETECTED NOT DETECTED Final   Escherichia coli NOT DETECTED NOT DETECTED Final   Klebsiella aerogenes NOT DETECTED NOT DETECTED Final   Klebsiella oxytoca NOT DETECTED NOT DETECTED Final   Klebsiella pneumoniae NOT DETECTED NOT DETECTED Final   Proteus species NOT DETECTED NOT DETECTED Final   Salmonella species NOT DETECTED NOT DETECTED Final   Serratia marcescens NOT DETECTED NOT DETECTED Final   Haemophilus influenzae NOT DETECTED NOT DETECTED Final   Neisseria meningitidis NOT DETECTED NOT DETECTED Final   Pseudomonas aeruginosa NOT DETECTED NOT DETECTED Final   Stenotrophomonas maltophilia NOT DETECTED NOT DETECTED Final   Candida albicans NOT DETECTED NOT DETECTED Final   Candida auris NOT DETECTED NOT DETECTED Final  Candida glabrata NOT DETECTED NOT DETECTED Final   Candida krusei NOT DETECTED NOT DETECTED Final   Candida parapsilosis NOT DETECTED NOT DETECTED Final   Candida tropicalis NOT DETECTED NOT DETECTED Final   Cryptococcus neoformans/gattii NOT DETECTED NOT DETECTED Final    Comment: Performed at [facility], [address], [place], [place] [address]  Resp Panel by RT-PCR (Flu A&B, Covid)     Status: None   Collection Time: 10/29/20  2:22 PM   Specimen: Nasopharyngeal(NP) swabs in vial transport medium  Result Value Ref Range Status   SARS Coronavirus 2 by RT PCR NEGATIVE NEGATIVE Final    Comment: (NOTE) SARS-CoV-2 target nucleic acids are NOT DETECTED.  The SARS-CoV-2 RNA is generally detectable in upper respiratory specimens during the acute phase of infection. The lowest concentration of SARS-CoV-2 viral copies this assay can detect is 138 copies/mL. A negative result does not preclude SARS-Cov-2 infection and should not be used as the sole basis for treatment or other patient management decisions. A negative result may occur with   improper specimen collection/handling, submission of specimen other than nasopharyngeal swab, presence of viral mutation(s) within the areas targeted by this assay, and inadequate number of viral copies(<138 copies/mL). A negative result must be combined with clinical observations, patient history, and epidemiological information. The expected result is Negative.  Fact Sheet for Patients:  BloggerCourse.com  Fact Sheet for Healthcare Providers:  SeriousBroker.it  This test is no t yet approved or cleared by the [place] FDA and  has been authorized for detection and/or diagnosis of SARS-CoV-2 by FDA under an Emergency Use Authorization (EUA). This EUA will remain  in effect (meaning this test can be used) for the duration of the COVID-19 declaration under Section 564(b)(1) of the Act, 21 U.S.C.section 360bbb-3(b)(1), unless the authorization is terminated  or revoked sooner.       Influenza A by PCR NEGATIVE NEGATIVE Final   Influenza B by PCR NEGATIVE NEGATIVE Final    Comment: (NOTE) The Xpert Xpress SARS-CoV-2/FLU/RSV plus assay is intended as an aid in the diagnosis of influenza from Nasopharyngeal swab specimens and should not be used as a sole basis for treatment. Nasal washings and aspirates are unacceptable for Xpert Xpress SARS-CoV-2/FLU/RSV testing.  Fact Sheet for Patients: BloggerCourse.com  Fact Sheet for Healthcare Providers: SeriousBroker.it  This test is not yet approved or cleared by the [place] FDA and has been authorized for detection and/or diagnosis of SARS-CoV-2 by FDA under an Emergency Use Authorization (EUA). This EUA will remain in effect (meaning this test can be used) for the duration of the COVID-19 declaration under Section 564(b)(1) of the Act, 21 U.S.C. section 360bbb-3(b)(1), unless the authorization is terminated  or revoked.  Performed at [facility], [address], [place], [place] [address]   Blood Culture (routine x 2)     Status: None   Collection Time: 10/29/20  2:28 PM   Specimen: BLOOD  Result Value Ref Range Status   Specimen Description BLOOD SITE NOT SPECIFIED  Final   Special Requests   Final    BOTTLES DRAWN AEROBIC AND ANAEROBIC Blood Culture adequate volume   Culture   Final    NO GROWTH 5 DAYS Performed at [facility], [address] [place], [place] [address]    Report Status 11/03/2020 FINAL  Final  Urine Culture     Status: Abnormal   Collection Time: 10/29/20  3:45 PM   Specimen: In/Out Cath Urine  Result Value Ref Range Status   Specimen Description IN/OUT CATH URINE  Final   Special Requests   Final    NONE Performed at [facility], [address] [place], [place] [address]    Culture MULTIPLE SPECIES PRESENT, SUGGEST RECOLLECTION (A)  Final   Report Status 10/30/2020 FINAL  Final  Culture, blood (routine x 2)     Status: None   Collection Time: 10/31/20  2:30 PM   Specimen: BLOOD  Result Value Ref Range Status   Specimen Description BLOOD RIGHT ANTECUBITAL  Final   Special Requests   Final    BOTTLES DRAWN AEROBIC ONLY Blood Culture results may not be optimal due to an inadequate volume of blood received in culture bottles   Culture   Final    NO GROWTH 5 DAYS Performed at [facility], [address] [place], [place] [address]    Report Status 11/05/2020 FINAL  Final  Culture, blood (routine x 2)     Status: None   Collection Time: 10/31/20  2:35 PM   Specimen: BLOOD RIGHT HAND  Result Value Ref Range Status   Specimen Description BLOOD RIGHT HAND  Final   Special Requests   Final    BOTTLES DRAWN AEROBIC ONLY Blood Culture results may not be optimal due to an inadequate volume of blood received in culture bottles   Culture   Final    NO GROWTH 5 DAYS Performed at [facility], [address], [place], [place]  [address]    Report Status 11/05/2020 FINAL  Final  Urine Culture     Status: None   Collection Time: 11/04/20  5:50 AM   Specimen: Urine, Clean Catch  Result Value Ref Range Status   Specimen Description URINE, CLEAN CATCH  Final   Special Requests NONE  Final   Culture   Final    NO GROWTH Performed at [facility], [address], [place], [place] [address]    Report Status 11/04/2020 FINAL  Final  Culture, blood (routine x 2)     Status: None (Preliminary result)   Collection Time: 11/04/20  7:51 AM   Specimen: BLOOD LEFT HAND  Result Value Ref Range Status   Specimen Description BLOOD LEFT HAND  Final   Special Requests   Final    BOTTLES DRAWN AEROBIC AND ANAEROBIC Blood Culture adequate volume   Culture   Final    NO GROWTH 3 DAYS Performed at [facility], [address] [place], [place] [address]    Report Status PENDING  Incomplete  Culture, blood (routine x 2)     Status: None (Preliminary result)   Collection Time: 11/04/20  8:00 AM   Specimen: BLOOD RIGHT HAND  Result Value Ref Range Status   Specimen Description BLOOD RIGHT HAND  Final   Special Requests   Final    BOTTLES DRAWN AEROBIC AND ANAEROBIC Blood Culture adequate volume   Culture   Final    NO GROWTH 3 DAYS Performed at [facility], [address], [place], [place] [address]    Report Status PENDING  Incomplete    Pertinent Lab. CBC Latest Ref Rng & Units 11/06/2020 11/04/2020 11/03/2020  WBC 4.0 - 10.5 K/uL 10.3 10.5 11.1(H)  Hemoglobin 12.0 - 15.0 g/dL 10.8(L) 10.1(L) 9.8(L)  Hematocrit 36.0 - 46.0 % 33.2(L) 31.0(L) 30.4(L)  Platelets 150 - 400 K/uL 314 320 293   CMP Latest Ref Rng &  Units 11/06/2020 11/05/2020 11/04/2020  Glucose 70 - 99 mg/dL 87 - 338(S)  BUN 8 - 23 mg/dL 8 - 8  Creatinine 5.05 - 1.00 mg/dL 3.97 6.73 4.19  Sodium 135 - 145 mmol/L 134(L) - 135  Potassium 3.5 - 5.1 mmol/L 3.3(L) - 3.7  Chloride 98 - 111 mmol/L 107 - 108  CO2 22 - 32 mmol/L 18(L) - 18(L)   Calcium 8.9 - 10.3 mg/dL 3.7(T) - 8.2(L)  Total Protein 6.5 - 8.1 g/dL - 5.4(L) -  Total Bilirubin 0.3 - 1.2 mg/dL - 0.6 -  Alkaline Phos 38 - 126 U/L - 70 -  AST 15 - 41 U/L - 20 -  ALT 0 - 44 U/L - 16 -     Pertinent Imaging today Plain films and CT images have been personally visualized and interpreted; radiology reports have been reviewed. Decision making incorporated into the Impression / Recommendations.  TEE 11/07/20 IMPRESSIONS   1. No vegetations noted.   2. Left ventricular ejection fraction, by estimation, is 55 to 60%. The  left ventricle has normal function. The left ventricle has no regional  wall motion abnormalities.   3. Right ventricular systolic function is normal. The right ventricular  size is normal.   4. Left atrial size was moderately dilated. No left atrial/left atrial  appendage thrombus was detected.   5. The mitral valve is normal in structure. Trivial mitral valve  regurgitation.   6. The aortic valve is tricuspid. Aortic valve regurgitation is mild.   7. 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   8. Evidence of atrial level shunting detected by color flow Doppler.  Agitated saline contrast bubble study was positive with shunting observed  within 3-6 cardiac cycles suggestive of interatrial shunt. There is a  small patent foramen ovale.    CT Code stroke 11/06/20 IMPRESSION: 1. No acute intracranial abnormality and no change from 3 days prior. 2. ASPECTS is 10   Venous Duplex 11/06/20 Summary:  RIGHT:  - There is no evidence of deep vein thrombosis in the lower extremity.     - No cystic structure found in the popliteal fossa.     LEFT:  - There is no evidence of deep vein thrombosis in the lower extremity.  However, portions of this examination were limited- see technologist  comments above.     - No cystic structure found in the popliteal fossa.   TTE 11/01/2020 Left ventricular ejection fraction, by  estimation, is 60 to 65%. The left ventricle has normal function. The left ventricle has no regional wall motion abnormalities. Left ventricular diastolic function could not be evaluated. 1. Right ventricular systolic function is normal. The right ventricular size is normal. There is mildly elevated pulmonary artery systolic pressure. 2. 3. Left atrial size was mildly dilated. 4. The mitral valve is normal in structure. Trivial mitral valve regurgitation. The aortic valve is normal in structure. Aortic valve regurgitation is mild. Mild aortic valve sclerosis is present, with no evidence of aortic valve stenosis. 5. The inferior vena cava is dilated in size with >50% respiratory variability, suggesting right atrial pressure of 8 mmHg. 6. Conclusion(s)/Recommendation(s): No evidence of valvular vegetations on this transthoracic echocardiogram. Would recommend a transesophageal echocardiogram to exclude infective endocarditis if clinically indicated.  I spent more than 35 minutes for this patient encounter including review of prior medical records, coordination of care  with greater than 50% of time being face to face/counseling and discussing diagnostics/treatment plan with the  patient/family.  Electronically signed by:   Odette Fraction, MD Infectious Disease Physician [facility] Pager: [phone]

## 2020-11-07 NOTE — Interval H&P Note (Signed)
History and Physical Interval Note:  11/07/2020 7:59 AM  [name]  has presented today for surgery, with the diagnosis of BACTERIMIA.  The various methods of treatment have been discussed with the patient and family. After consideration of risks, benefits and other options for treatment, the patient has consented to  Procedure(s): TRANSESOPHAGEAL ECHOCARDIOGRAM (TEE) (N/A) as a surgical intervention.  The patient's history has been reviewed, patient examined, no change in status, stable for surgery.  I have reviewed the patient's chart and labs.  Questions were answered to the patient's satisfaction.     Olga Millers

## 2020-11-07 NOTE — Anesthesia Preprocedure Evaluation (Signed)
Anesthesia Evaluation    Airway Mallampati: II  TM Distance: >3 FB Neck ROM: Full    Dental no notable dental hx.    Pulmonary former smoker,    Pulmonary exam normal breath sounds clear to auscultation       Cardiovascular hypertension, Pt. on medications Normal cardiovascular exam Rhythm:Regular Rate:Normal     Neuro/Psych Depression    GI/Hepatic GERD  ,  Endo/Other    Renal/GU      Musculoskeletal  (+) Arthritis , Osteoarthritis,    Abdominal   Peds  Hematology   Anesthesia Other Findings   Reproductive/Obstetrics                             Anesthesia Physical Anesthesia Plan  ASA: 3  Anesthesia Plan: MAC   Post-op Pain Management:    Induction: Intravenous  PONV Risk Score and Plan: 2 and Ondansetron, Midazolam and Treatment may vary due to age or medical condition  Airway Management Planned:   Additional Equipment:   Intra-op Plan:   Post-operative Plan:   Informed Consent: I have reviewed the patients History and Physical, chart, labs and discussed the procedure including the risks, benefits and alternatives for the proposed anesthesia with the patient or authorized representative who has indicated his/her understanding and acceptance.     Dental advisory given  Plan Discussed with: CRNA  Anesthesia Plan Comments:         Anesthesia Quick Evaluation

## 2020-11-07 NOTE — Progress Notes (Signed)
 Progress Note  Patient Name: Andrea Yu Date of Encounter: 11/07/2020  [facility] Cardiologist: Mihai Croitoru, MD   Subjective   She is more alert than yesterday afternoon, but remains somewhat groggy and clearly is confused. CT head did not show new stroke, some chronic changes noted. Atrial fibrillation rate is now well controlled. Getting ready to have TEE this morning  Inpatient Medications    Scheduled Meds:  [MAR Hold] aspirin EC  81 mg Oral QPM   [MAR Hold] docusate sodium  100 mg Oral BID   [MAR Hold] DULoxetine  90 mg Oral Daily   [MAR Hold] enoxaparin (LOVENOX) injection  40 mg Subcutaneous Q24H   [MAR Hold] metoprolol succinate  50 mg Oral Daily   [MAR Hold] nystatin  5 mL Oral QID   [MAR Hold] pantoprazole  40 mg Oral Daily   Continuous Infusions:  sodium chloride     [MAR Hold] diltiazem (CARDIZEM) infusion 10 mg/hr (11/07/20 0610)   [MAR Hold] vancomycin 750 mg (11/06/20 1155)   PRN Meds: [MAR Hold] acetaminophen **OR** [MAR Hold] acetaminophen, [MAR Hold] HYDROcodone-acetaminophen, [MAR Hold] ipratropium-albuterol, [MAR Hold] menthol-cetylpyridinium, [MAR Hold] metoprolol tartrate, [MAR Hold] ondansetron **OR** [MAR Hold] ondansetron (ZOFRAN) IV   Vital Signs    Vitals:   11/07/20 0457 11/07/20 0631 11/07/20 0723 11/07/20 0749  BP:  115/74 (!) 121/59 99/68  Pulse: (!) 123 (!) 122 (!) 113 (!) 115  Resp: 18 19 18 (!) 28  Temp: (!) 100.9 F (38.3 C) (!) 101 F (38.3 C) 100 F (37.8 C) 99.4 F (37.4 C)  TempSrc: Axillary Oral Oral Temporal  SpO2:  100% 100% 98%  Weight:      Height:        Intake/Output Summary (Last 24 hours) at 11/07/2020 0823 Last data filed at 11/07/2020 0655 Gross per 24 hour  Intake 601.18 ml  Output 1800 ml  Net -1198.82 ml   Last 3 Weights 11/04/2020 10/30/2020 12/13/2019  Weight (lbs) 191 lb 9.3 oz 182 lb 15.7 oz 183 lb  Weight (kg) 86.9 kg 83 kg 83.008 kg      Telemetry    Atrial fibrillation with  controlled ventricular rate, occasional mild RVR- Personally Reviewed  ECG    No new tracing- Personally Reviewed  Physical Exam  Lying fully flat in bed without breathing difficulty.  Sleepy, easy to awake but appears disoriented and sometimes speech makes no sense GEN: No acute distress.   Neck: No JVD Cardiac: Irregular, no murmurs, rubs, or gallops.  Respiratory: Clear to auscultation bilaterally. GI: Soft, nontender, non-distended  MS: No edema; No deformity. Neuro:  Nonfocal, follows most commands appropriately, disoriented Psych: Normal affect   Labs    High Sensitivity Troponin:  No results for input(s): TROPONINIHS in the last 720 hours.   Chemistry Recent Labs  Lab 11/03/20 0907 11/04/20 0331 11/05/20 0457 11/06/20 0451  NA 133* 135  --  134*  K 2.9* 3.7  --  3.3*  CL 103 108  --  107  CO2 19* 18*  --  18*  GLUCOSE 90 112*  --  87  BUN 9 8  --  8  CREATININE 1.02* 0.95 0.91 0.91  CALCIUM 8.1* 8.2*  --  8.4*  MG 1.7  --   --   --   PROT  --   --  5.4*  --   ALBUMIN  --   --  2.0*  --   AST  --   --  20  --     ALT  --   --  16  --   ALKPHOS  --   --  70  --   BILITOT  --   --  0.6  --   GFRNONAA 55* >60 >60 >60  ANIONGAP 11 9  --  9    Lipids No results for input(s): CHOL, TRIG, HDL, LABVLDL, LDLCALC, CHOLHDL in the last 168 hours.  Hematology Recent Labs  Lab 11/03/20 0907 11/04/20 0331 11/06/20 0451  WBC 11.1* 10.5 10.3  RBC 3.57* 3.67* 3.86*  HGB 9.8* 10.1* 10.8*  HCT 30.4* 31.0* 33.2*  MCV 85.2 84.5 86.0  MCH 27.5 27.5 28.0  MCHC 32.2 32.6 32.5  RDW 15.0 14.7 15.5  PLT 293 320 314   Thyroid  Recent Labs  Lab 11/03/20 0907  TSH 3.168    BNP Recent Labs  Lab 11/06/20 0451  BNP 471.6*    DDimer No results for input(s): DDIMER in the last 168 hours.   Radiology    CT HEAD CODE STROKE WO CONTRAST  Result Date: 11/06/2020 CLINICAL DATA:  Code stroke. Acute neuro deficit. Not following commands EXAM: CT HEAD WITHOUT CONTRAST  TECHNIQUE: Contiguous axial images were obtained from the base of the skull through the vertex without intravenous contrast. COMPARISON:  CT head 11/03/2020 FINDINGS: Brain: Mild atrophy. Patchy white matter hypodensity bilaterally appears chronic and unchanged from the prior study. Negative for acute infarct, hemorrhage, mass. Vascular: Negative for hyperdense vessel Skull: Negative Sinuses/Orbits: Paranasal sinuses clear.  No orbital lesion. Other: None ASPECTS (Alberta Stroke Program Early CT Score) - Ganglionic level infarction (caudate, lentiform nuclei, internal capsule, insula, M1-M3 cortex): 7 - Supraganglionic infarction (M4-M6 cortex): 3 Total score (0-10 with 10 being normal): 10 IMPRESSION: 1. No acute intracranial abnormality and no change from 3 days prior. 2. ASPECTS is 10 3. Code stroke imaging results were communicated on 11/06/2020 at 11:02 am to provider Collins via text page Electronically Signed   By: Charles  Clark M.D.   On: 11/06/2020 11:04   VAS US LOWER EXTREMITY VENOUS (DVT)  Result Date: 11/05/2020  Lower Venous DVT Study Patient Name:  Andrea Yu  Date of Exam:   11/05/2020 Medical Rec #: [id number]      Accession #:    [id number] Date of Birth: 10/10/1940     Patient Gender: F Patient Age:   79 years Exam Location:  [facility] Procedure:      VAS US LOWER EXTREMITY VENOUS (DVT) Referring Phys: SABINA MANANDHAR --------------------------------------------------------------------------------  Indications: Bacteremia.  Limitations: Patient movement and positioning. Comparison Study: no prior Performing Technologist: Megan Stricklin RVS  Examination Guidelines: A complete evaluation includes B-mode imaging, spectral Doppler, color Doppler, and power Doppler as needed of all accessible portions of each vessel. Bilateral testing is considered an integral part of a complete examination. Limited examinations for reoccurring indications may be performed as noted. The reflux  portion of the exam is performed with the patient in reverse Trendelenburg.  +---------+---------------+---------+-----------+----------+-------------------+ RIGHT    CompressibilityPhasicitySpontaneityPropertiesThrombus Aging      +---------+---------------+---------+-----------+----------+-------------------+ CFV      Full           Yes      Yes                                      +---------+---------------+---------+-----------+----------+-------------------+ SFJ      Full                                                             +---------+---------------+---------+-----------+----------+-------------------+   FV Prox  Full                                                             +---------+---------------+---------+-----------+----------+-------------------+ FV Mid   Full                                                             +---------+---------------+---------+-----------+----------+-------------------+ FV DistalFull                                                             +---------+---------------+---------+-----------+----------+-------------------+ PFV      Full                                                             +---------+---------------+---------+-----------+----------+-------------------+ POP      Full           Yes      Yes                                      +---------+---------------+---------+-----------+----------+-------------------+ PTV      Full                                                             +---------+---------------+---------+-----------+----------+-------------------+ PERO                                                  Not well visualized +---------+---------------+---------+-----------+----------+-------------------+   +---------+---------------+---------+-----------+----------+-------------------+ LEFT     CompressibilityPhasicitySpontaneityPropertiesThrombus Aging       +---------+---------------+---------+-----------+----------+-------------------+ CFV      Full           Yes      Yes                                      +---------+---------------+---------+-----------+----------+-------------------+ SFJ      Full                                                             +---------+---------------+---------+-----------+----------+-------------------+ FV Prox  Full                                                             +---------+---------------+---------+-----------+----------+-------------------+  FV Mid   Full                                                             +---------+---------------+---------+-----------+----------+-------------------+ FV DistalFull                                                             +---------+---------------+---------+-----------+----------+-------------------+ PFV      Full                                                             +---------+---------------+---------+-----------+----------+-------------------+ POP      Full           Yes      Yes                                      +---------+---------------+---------+-----------+----------+-------------------+ PTV                                                   Not well visualized +---------+---------------+---------+-----------+----------+-------------------+ PERO                                                  Not well visualized +---------+---------------+---------+-----------+----------+-------------------+     Summary: RIGHT: - There is no evidence of deep vein thrombosis in the lower extremity.  - No cystic structure found in the popliteal fossa.  LEFT: - There is no evidence of deep vein thrombosis in the lower extremity. However, portions of this examination were limited- see technologist comments above.  - No cystic structure found in the popliteal fossa.  *See table(s) above for measurements and  observations. Electronically signed by Harold Barban MD on 11/05/2020 at 9:28:13 PM.    Final     Cardiac Studies   Transthoracic echocardiogram 11/01/2020    1. Left ventricular ejection fraction, by estimation, is 60 to 65%. The  left ventricle has normal function. The left ventricle has no regional  wall motion abnormalities. Left ventricular diastolic function could not  be evaluated.   2. Right ventricular systolic function is normal. The right ventricular  size is normal. There is mildly elevated pulmonary artery systolic  pressure.   3. Left atrial size was mildly dilated.   4. The mitral valve is normal in structure. Trivial mitral valve  regurgitation.   5. The aortic valve is normal in structure. Aortic valve regurgitation is  mild. Mild aortic valve sclerosis is present, with no evidence of aortic  valve stenosis.   6. The inferior vena cava is dilated in size with >50%  respiratory  variability, suggesting right atrial pressure of 8 mmHg.   Patient Profile     80 y.o. female with permanent atrial fibrillation, essential hypertension, psoriatic arthritis, history of neuropathy with orthostatic hypotension and frequent falls presenting with acute febrile illness, encephalopathy initially felt to be urosepsis, but subsequently demonstrated to have Streptococcus parasanguinis bacteremia (albeit only 1 of 4 culture bottles)  Assessment & Plan      Encephalopathy: Head CT without evidence of stroke, but may need to consider MRI.  She is not on anticoagulation and might have endocarditis, high risk for embolic stroke. Sepsis: Despite a prolonged course of antibiotics she remains febrile, which raises the concern for endocarditis as a possible cause for her illness, rather than just a urinary tract infection.  TEE this morning will guide the duration of antibiotic therapy.  ESR and CRP are both markedly elevated, but this may be confounded by her underlying psoriatic arthritis with  chronic elevation in inflammatory markers. AFib: Now well controlled on IV diltiazem, plan to transition back to oral medications as appropriate.  BNP is moderately elevated, not impressive for her age.  Lower extremity venous Doppler negative for clot yesterday.  Hypokalemia: No labs available yet, but suspect she will still be potassium deficient.  Will order another 20 mEq of KCl supplement.          For questions or updates, please contact Tracy Please consult [email or web address] for contact info under        Signed, Sanda Klein, MD  11/07/2020, 8:23 AM

## 2020-11-08 DIAGNOSIS — A419 Sepsis, unspecified organism: Secondary | ICD-10-CM | POA: Diagnosis not present

## 2020-11-08 DIAGNOSIS — R6520 Severe sepsis without septic shock: Secondary | ICD-10-CM | POA: Diagnosis not present

## 2020-11-08 LAB — CBC
HCT: 29.7 % — ABNORMAL LOW (ref 36.0–46.0)
Hemoglobin: 9.5 g/dL — ABNORMAL LOW (ref 12.0–15.0)
MCH: 27.4 pg (ref 26.0–34.0)
MCHC: 32 g/dL (ref 30.0–36.0)
MCV: 85.6 fL (ref 80.0–100.0)
Platelets: 312 10*3/uL (ref 150–400)
RBC: 3.47 MIL/uL — ABNORMAL LOW (ref 3.87–5.11)
RDW: 15.8 % — ABNORMAL HIGH (ref 11.5–15.5)
WBC: 7.8 10*3/uL (ref 4.0–10.5)
nRBC: 0 % (ref 0.0–0.2)

## 2020-11-08 LAB — BASIC METABOLIC PANEL
Anion gap: 9 (ref 5–15)
BUN: 9 mg/dL (ref 8–23)
CO2: 16 mmol/L — ABNORMAL LOW (ref 22–32)
Calcium: 8 mg/dL — ABNORMAL LOW (ref 8.9–10.3)
Chloride: 108 mmol/L (ref 98–111)
Creatinine, Ser: 0.93 mg/dL (ref 0.44–1.00)
GFR, Estimated: >60 mL/min (ref >60)
Glucose, Bld: 83 mg/dL (ref 70–99)
Potassium: 3.2 mmol/L — ABNORMAL LOW (ref 3.5–5.1)
Sodium: 133 mmol/L — ABNORMAL LOW (ref 135–145)

## 2020-11-08 MED ORDER — ORAL CARE MOUTH RINSE
15.0000 mL | Freq: Two times a day (BID) | OROMUCOSAL | Status: DC
  Administered 2020-11-08 – 2020-11-14 (×13): 15 mL via OROMUCOSAL

## 2020-11-08 MED ORDER — POTASSIUM CHLORIDE 20 MEQ PO PACK
40.0000 meq | PACK | ORAL | Status: AC
  Administered 2020-11-08 (×2): 40 meq via ORAL
  Filled 2020-11-08: qty 2

## 2020-11-08 MED ORDER — ENSURE ENLIVE PO LIQD
237.0000 mL | Freq: Two times a day (BID) | ORAL | Status: DC
  Administered 2020-11-08 – 2020-11-12 (×8): 237 mL via ORAL

## 2020-11-08 NOTE — Progress Notes (Signed)
Patient ID: Andrea Yu, female   DOB: 05-19-1940, 80 y.o.   MRN: [id number] Request received for image aspiration of fluid collection in the inferior portion of the right buttocks in patient.  Previous imaging studies were reviewed by Dr. Miles Costain.  This area has been aspirated or drained twice in 2019 with findings consistent with hematoma.  On most recent imaging this area is smaller in size.  Dr. Miles Costain feels that repeat aspiration is not indicated at this point. Please contact Dr. Miles Costain at [phone] or pager [phone] with any additional questions.

## 2020-11-08 NOTE — Progress Notes (Signed)
PROGRESS NOTE  Andrea Yu:[id number] DOB: [birth year] DOA: 10/29/2020 PCP: Jarome Matin, MD  HPI/Recap of past 71 hours: 80 year old with past medical history significant for paroxysmal A. fib not on oral anticoagulation, GERD, neuropathy, morbid obesity, psoriatic arthritis, iron deficiency anemia, history of subarachnoid hemorrhage, hypertension who presents to the ED due to urinary frequency and decreased oral intake.  Prescribed Bactrim by her PCP for UTI prior to her presentation.  She completed course but continued to have dysuria.  Noted to be slightly confused.  Work-up in the ED revealed leukocytosis, tachycardia, tachypnea, fever, hypotension thought to be secondary to UTI with UA positive for pyuria.  CT abdomen and pelvis showed fluid collection inferior portion of the right buttock region, inferior to the right ischial tuberosity.  Blood cultures obtained on 10/29/2020 positive for Streptococcus parasanguinis 1 out of 4 bottles, seen by infectious disease, thought to be a contaminant.    Hospital course complicated by recurrent fevers without any clear sources of infection.  Followed by infectious disease.  Additionally, she had 1 episode of altered mental status on 11/06/2020, for which code stroke was called, returned to baseline.  She had a TEE completed on 11/07/2020 to rule out endocarditis due to recurrent fevers, this was ruled out.  CT chest/abdomen pelvis with contrast done on 11/07/2020, was unremarkable for any acute findings.  Evaluated by IR due to fluid collection seen on CT scan, no indication for aspiration at this time.  11/08/2020: Seen and examined at her bedside.  She is more alert and interactive today.  She is asking for a cup of water which I handed to her and she drank safely.  She had no other complaints.  She received her last dose of IV vancomycin.  Per infectious disease DC IV antibiotics if pan CT scans are negative for acute findings.      Assessment/Plan: Principal Problem:   Severe sepsis (HCC) Active Problems:   Adrenal insufficiency (HCC)   PAF (paroxysmal atrial fibrillation) (HCC)   Psoriatic arthritis (HCC)   Iron deficiency anemia- transfused this admission   Depression   Peripheral neuropathy   Obesity (BMI 30-39.9)- suspected sleep apnea, declines sleep study   SAH (subarachnoid hemorrhage) (HCC)   Essential hypertension   GERD (gastroesophageal reflux disease)   Bacteremia  Sepsis of unclear etiology/recurrent fevers of unknown cause Urine culture grew multiple species and suggested recollection. Blood cultures obtained on 10/29/2020 positive for Streptococcus parasanguinis 1 out of 4 bottles, seen by ID, thought to be a contaminant. Repeated blood cultures drawn on 11/04/2020 negative to date. CT scan chest/abdomen/pelvis with contrast ordered on 11/07/2020 due to no clear source of infection, no acute findings. Fluid collection seen on CT scan, reviewed by interventional radiology, no indication for aspiration at this time. Last dose of IV vancomycin on 11/08/2020.  Antibiotics discontinued per infectious disease recommendation, to DC if CT scans were negative for any acute findings.  6.3 cm fluid collection seen in the inferior portion of the right buttocks, rule out an abscess IR consulted for possible aspiration and fluid analysis No indication at this time for aspiration This area has been aspirated or drained twice in 2019 with finding consistent with hematoma per IR.  Acute metabolic encephalopathy, improving CT head done on 11/03/2020 showed chronic small vessel disease without acute intracranial abnormality Reorient as needed Fall precautions More alert and interactive today.  Refractory hypokalemia Repleted intravenously. Repeated potassium 3.2 Repleted orally 40 mg twice daily x 2 doses. Repeat BMP  in the morning Obtain magnesium level in the morning.  Mild hypovolemic  hyponatremia Serum sodium 134 Normal saline at 30 cc/h x2 days to avoid contrast-induced nephropathy.  Chronic diastolic CHF Euvolemic on exam BNP 471 Use IV fluid judiciously, currently on normal saline at 30 cc/h x 2 days due to IV contrast. Closely monitor volume status while on IV fluid Continue strict I's and O's and daily weight  Chronic anxiety/depression Continue home duloxetine  GERD Continue home PPI  Generalized weakness/physical debility PT OT recommended SNF TOC consulted to assist with DC planning  Code Status: Full code  Family Communication: None at bedside  Disposition Plan: Likely will DC to SNF.   Consultants: Infectious disease Cardiology  Procedures: TEE  Antimicrobials: IV vancomycin, last dose on 11/08/2020.  DVT prophylaxis: Subcu Lovenox daily.  Status is: Inpatient          Objective: Vitals:   11/07/20 2342 11/08/20 0311 11/08/20 0802 11/08/20 1138  BP: (!) 125/56 (!) 145/72 (!) 122/95 (!) 103/58  Pulse: 62 (!) 101 99 93  Resp: 17 16 20 20   Temp: 98.2 F (36.8 C) 98.8 F (37.1 C) 98.9 F (37.2 C) 98.1 F (36.7 C)  TempSrc: Oral Oral Oral Oral  SpO2: 97% 100% 100% 100%  Weight:  74.8 kg    Height:        Intake/Output Summary (Last 24 hours) at 11/08/2020 1308 Last data filed at 11/08/2020 1239 Gross per 24 hour  Intake 1871.02 ml  Output 750 ml  Net 1121.02 ml   Filed Weights   10/30/20 0700 11/04/20 0327 11/08/20 0311  Weight: 83 kg 86.9 kg 74.8 kg    Exam:  General: 80 y.o. year-old female obese in no acute distress.  She is alert and interactive. Cardiovascular: Regular rate and rhythm no rubs or gallops.  Respiratory: Clear to auscultation with no wheezes or rales.  Abdomen: Soft nontender normal bowel sounds present.  Musculoskeletal: No lower extremity edema bilaterally.   Skin: No ulcerative lesions noted. Psychiatry: Mood is appropriate for condition and setting.   Data Reviewed: CBC: Recent  Labs  Lab 11/02/20 0746 11/03/20 0907 11/04/20 0331 11/06/20 0451 11/08/20 0454  WBC 13.2* 11.1* 10.5 10.3 7.8  HGB 10.1* 9.8* 10.1* 10.8* 9.5*  HCT 32.0* 30.4* 31.0* 33.2* 29.7*  MCV 88.4 85.2 84.5 86.0 85.6  PLT 231 293 320 314 312   Basic Metabolic Panel: Recent Labs  Lab 11/02/20 0746 11/03/20 0907 11/04/20 0331 11/05/20 0457 11/06/20 0451 11/08/20 0454  NA 130* 133* 135  --  134* 133*  K 3.5 2.9* 3.7  --  3.3* 3.2*  CL 101 103 108  --  107 108  CO2 18* 19* 18*  --  18* 16*  GLUCOSE 77 90 112*  --  87 83  BUN 7* 9 8  --  8 9  CREATININE 1.09* 1.02* 0.95 0.91 0.91 0.93  CALCIUM 8.1* 8.1* 8.2*  --  8.4* 8.0*  MG  --  1.7  --   --   --   --    GFR: Estimated Creatinine Clearance: 41.8 mL/min (by C-G formula based on SCr of 0.93 mg/dL). Liver Function Tests: Recent Labs  Lab 11/05/20 0457  AST 20  ALT 16  ALKPHOS 70  BILITOT 0.6  PROT 5.4*  ALBUMIN 2.0*   No results for input(s): LIPASE, AMYLASE in the last 168 hours. No results for input(s): AMMONIA in the last 168 hours. Coagulation Profile: No results for input(s): INR, PROTIME  in the last 168 hours. Cardiac Enzymes: No results for input(s): CKTOTAL, CKMB, CKMBINDEX, TROPONINI in the last 168 hours. BNP (last 3 results) No results for input(s): PROBNP in the last 8760 hours. HbA1C: No results for input(s): HGBA1C in the last 72 hours. CBG: Recent Labs  Lab 11/04/20 0517 11/07/20 0645  GLUCAP 126* 84   Lipid Profile: No results for input(s): CHOL, HDL, LDLCALC, TRIG, CHOLHDL, LDLDIRECT in the last 72 hours. Thyroid Function Tests: No results for input(s): TSH, T4TOTAL, FREET4, T3FREE, THYROIDAB in the last 72 hours. Anemia Panel: No results for input(s): VITAMINB12, FOLATE, FERRITIN, TIBC, IRON, RETICCTPCT in the last 72 hours. Urine analysis:    Component Value Date/Time   COLORURINE YELLOW 11/04/2020 0550   APPEARANCEUR HAZY (A) 11/04/2020 0550   LABSPEC 1.013 11/04/2020 0550   PHURINE 6.0  11/04/2020 0550   GLUCOSEU NEGATIVE 11/04/2020 0550   HGBUR MODERATE (A) 11/04/2020 0550   BILIRUBINUR NEGATIVE 11/04/2020 0550   KETONESUR NEGATIVE 11/04/2020 0550   PROTEINUR 30 (A) 11/04/2020 0550   UROBILINOGEN 0.2 09/14/2014 1637   NITRITE NEGATIVE 11/04/2020 0550   LEUKOCYTESUR LARGE (A) 11/04/2020 0550   Sepsis Labs: (procalcitonin:4,lacticidven:4)  ) Recent Results (from the past 240 hour(s))  Resp Panel by RT-PCR (Flu A&B, Covid)     Status: None   Collection Time: 10/29/20  2:22 PM   Specimen: Nasopharyngeal(NP) swabs in vial transport medium  Result Value Ref Range Status   SARS Coronavirus 2 by RT PCR NEGATIVE NEGATIVE Final    Comment: (NOTE) SARS-CoV-2 target nucleic acids are NOT DETECTED.  The SARS-CoV-2 RNA is generally detectable in upper respiratory specimens during the acute phase of infection. The lowest concentration of SARS-CoV-2 viral copies this assay can detect is 138 copies/mL. A negative result does not preclude SARS-Cov-2 infection and should not be used as the sole basis for treatment or other patient management decisions. A negative result may occur with  improper specimen collection/handling, submission of specimen other than nasopharyngeal swab, presence of viral mutation(s) within the areas targeted by this assay, and inadequate number of viral copies(<138 copies/mL). A negative result must be combined with clinical observations, patient history, and epidemiological information. The expected result is Negative.  Fact Sheet for Patients:  BloggerCourse.com  Fact Sheet for Healthcare Providers:  SeriousBroker.it  This test is no t yet approved or cleared by the [place] FDA and  has been authorized for detection and/or diagnosis of SARS-CoV-2 by FDA under an Emergency Use Authorization (EUA). This EUA will remain  in effect (meaning this test can be used) for the duration of  the COVID-19 declaration under Section 564(b)(1) of the Act, 21 U.S.C.section 360bbb-3(b)(1), unless the authorization is terminated  or revoked sooner.       Influenza A by PCR NEGATIVE NEGATIVE Final   Influenza B by PCR NEGATIVE NEGATIVE Final    Comment: (NOTE) The Xpert Xpress SARS-CoV-2/FLU/RSV plus assay is intended as an aid in the diagnosis of influenza from Nasopharyngeal swab specimens and should not be used as a sole basis for treatment. Nasal washings and aspirates are unacceptable for Xpert Xpress SARS-CoV-2/FLU/RSV testing.  Fact Sheet for Patients: BloggerCourse.com  Fact Sheet for Healthcare Providers: SeriousBroker.it  This test is not yet approved or cleared by the [place] FDA and has been authorized for detection and/or diagnosis of SARS-CoV-2 by FDA under an Emergency Use Authorization (EUA). This EUA will remain in effect (meaning this test can be used) for the duration of the COVID-19 declaration  under Section 564(b)(1) of the Act, 21 U.S.C. section 360bbb-3(b)(1), unless the authorization is terminated or revoked.  Performed at [facility], [address], [place], [place] [address]   Blood Culture (routine x 2)     Status: None   Collection Time: 10/29/20  2:28 PM   Specimen: BLOOD  Result Value Ref Range Status   Specimen Description BLOOD SITE NOT SPECIFIED  Final   Special Requests   Final    BOTTLES DRAWN AEROBIC AND ANAEROBIC Blood Culture adequate volume   Culture   Final    NO GROWTH 5 DAYS Performed at [facility], [address], [place], [place] [address]    Report Status 11/03/2020 FINAL  Final  Urine Culture     Status: Abnormal   Collection Time: 10/29/20  3:45 PM   Specimen: In/Out Cath Urine  Result Value Ref Range Status   Specimen Description IN/OUT CATH URINE  Final   Special Requests   Final    NONE Performed at [facility], [address],  [place], [place] [address]    Culture MULTIPLE SPECIES PRESENT, SUGGEST RECOLLECTION (A)  Final   Report Status 10/30/2020 FINAL  Final  Culture, blood (routine x 2)     Status: None   Collection Time: 10/31/20  2:30 PM   Specimen: BLOOD  Result Value Ref Range Status   Specimen Description BLOOD RIGHT ANTECUBITAL  Final   Special Requests   Final    BOTTLES DRAWN AEROBIC ONLY Blood Culture results may not be optimal due to an inadequate volume of blood received in culture bottles   Culture   Final    NO GROWTH 5 DAYS Performed at [facility], [address] [place], [place] [address]    Report Status 11/05/2020 FINAL  Final  Culture, blood (routine x 2)     Status: None   Collection Time: 10/31/20  2:35 PM   Specimen: BLOOD RIGHT HAND  Result Value Ref Range Status   Specimen Description BLOOD RIGHT HAND  Final   Special Requests   Final    BOTTLES DRAWN AEROBIC ONLY Blood Culture results may not be optimal due to an inadequate volume of blood received in culture bottles   Culture   Final    NO GROWTH 5 DAYS Performed at [facility], [address] [place], [place] [address]    Report Status 11/05/2020 FINAL  Final  Urine Culture     Status: None   Collection Time: 11/04/20  5:50 AM   Specimen: Urine, Clean Catch  Result Value Ref Range Status   Specimen Description URINE, CLEAN CATCH  Final   Special Requests NONE  Final   Culture   Final    NO GROWTH Performed at [facility], [address], [place], [place] [address]    Report Status 11/04/2020 FINAL  Final  Culture, blood (routine x 2)     Status: None (Preliminary result)   Collection Time: 11/04/20  7:51 AM   Specimen: BLOOD LEFT HAND  Result Value Ref Range Status   Specimen Description BLOOD LEFT HAND  Final   Special Requests   Final    BOTTLES DRAWN AEROBIC AND ANAEROBIC Blood Culture adequate volume   Culture   Final    NO GROWTH 4 DAYS Performed at [facility], [address],  [place], [place] [address]    Report Status PENDING  Incomplete  Culture,  blood (routine x 2)     Status: None (Preliminary result)   Collection Time: 11/04/20  8:00 AM   Specimen: BLOOD RIGHT HAND  Result Value Ref Range Status   Specimen Description BLOOD RIGHT HAND  Final   Special Requests   Final    BOTTLES DRAWN AEROBIC AND ANAEROBIC Blood Culture adequate volume   Culture   Final    NO GROWTH 4 DAYS Performed at [facility], [address], [place], [place] [address]    Report Status PENDING  Incomplete      Studies: CT CHEST ABDOMEN PELVIS W CONTRAST  Result Date: 11/07/2020 CLINICAL DATA:  Fever unknown of unknown origin EXAM: CT CHEST, ABDOMEN, AND PELVIS WITH CONTRAST TECHNIQUE: Multidetector CT imaging of the chest, abdomen and pelvis was performed following the standard protocol during bolus administration of intravenous contrast. CONTRAST:  OMNIPAQUE IOHEXOL 350 MG/ML SOLN COMPARISON:  CT October 29, 2020. FINDINGS: CT CHEST FINDINGS Cardiovascular: Aortic and branch vessel atherosclerosis without thoracic aortic aneurysm. No central pulmonary embolus on this nondedicated study. Coronary artery calcifications. Normal size heart. No significant pericardial effusion/thickening. Mediastinum/Nodes: No suspicious thyroid nodule. No mediastinal, hilar or axillary adenopathy. The trachea esophagus are unremarkable. Lungs/Pleura: Tiny left greater than right pleural effusions. Hypoventilatory change in the dependent lungs. No pneumothorax. Musculoskeletal: Multilevel degenerative changes spine. No acute osseous abnormality. CT ABDOMEN PELVIS FINDINGS Hepatobiliary: No suspicious hepatic lesion. Gallbladder surgically absent. No biliary ductal dilation. Pancreas: Within normal limits. Spleen: Within normal limits. Adrenals/Urinary Tract: Bilateral adrenal glands are unremarkable. No hydronephrosis. No renal, ureteral or bladder calculus visualized. Right renal cyst. Similar mild  right greater than left perinephric stranding. Urinary bladder is unremarkable for degree of distension. Stomach/Bowel: Stomach is unremarkable. No pathologic dilation of small or large bowel. Gas fluid levels in the colon suggestive of diarrheal illness. No evidence of acute bowel inflammation. Vascular/Lymphatic: Extensive aortic and branch vessel atherosclerosis without aortic aneurysm. No pathologically enlarged abdominal or pelvic lymph nodes. Reproductive: Status post hysterectomy. No adnexal masses. Other: Trace pelvic free fluid. Two moderate-sized fat containing periumbilical hernias. Similar size of the fluid collection seen in the inferior portion the right buttocks inferior to the right ischial tuberosity measuring 6.3 x 2.8 cm previously 6.1 x 3.3 cm. Sequela of subcutaneous injections in the anterior abdominal wall. Musculoskeletal: Multilevel degenerative changes spine. L3-L5 posterior spinal fusion hardware. No acute osseous abnormality. IMPRESSION: 1. Gas fluid levels in the colon suggestive of diarrheal illness. No evidence of acute bowel inflammation or obstruction. 2. Tiny left greater than right pleural effusions. 3. Similar size of the fluid collection in the inferior portion of the right buttocks inferior to the right ischial tuberosity measuring 6.3 x 2.8 cm which again the has been present on prior examinations and may reflect an abscess correlation with direct visualization suggested. 4. Two moderate fat containing periumbilical hernias. 5. Aortic Atherosclerosis (ICD10-I70.0). Electronically Signed   By: Maudry Mayhew M.D.   On: 11/07/2020 18:39    Scheduled Meds:  aspirin EC  81 mg Oral QPM   docusate sodium  100 mg Oral BID   DULoxetine  90 mg Oral Daily   enoxaparin (LOVENOX) injection  40 mg Subcutaneous Q24H   feeding supplement  237 mL Oral BID BM   influenza vaccine adjuvanted  0.5 mL Intramuscular Tomorrow-1000   mouth rinse  15 mL Mouth Rinse BID   metoprolol succinate   50 mg Oral Daily   nystatin  5 mL Oral QID  pantoprazole  40 mg Oral Daily   potassium chloride  40 mEq Oral Q1 Hr x 2    Continuous Infusions:  sodium chloride 30 mL/hr at 11/08/20 0423   diltiazem (CARDIZEM) infusion Stopped (11/07/20 1641)     LOS: 10 days     Darlin Drop, MD [facility] Pager [phone]  If 7PM-7AM, please contact night-coverage [email or web address] Password [email or web address] 11/08/2020, 1:08 PM

## 2020-11-09 ENCOUNTER — Encounter (HOSPITAL_COMMUNITY): Payer: Self-pay | Admitting: Cardiology

## 2020-11-09 DIAGNOSIS — A419 Sepsis, unspecified organism: Secondary | ICD-10-CM | POA: Diagnosis not present

## 2020-11-09 DIAGNOSIS — R6520 Severe sepsis without septic shock: Secondary | ICD-10-CM | POA: Diagnosis not present

## 2020-11-09 LAB — BASIC METABOLIC PANEL
Anion gap: 7 (ref 5–15)
BUN: 12 mg/dL (ref 8–23)
CO2: 18 mmol/L — ABNORMAL LOW (ref 22–32)
Calcium: 8.3 mg/dL — ABNORMAL LOW (ref 8.9–10.3)
Chloride: 108 mmol/L (ref 98–111)
Creatinine, Ser: 0.92 mg/dL (ref 0.44–1.00)
GFR, Estimated: >60 mL/min (ref >60)
Glucose, Bld: 88 mg/dL (ref 70–99)
Potassium: 4 mmol/L (ref 3.5–5.1)
Sodium: 133 mmol/L — ABNORMAL LOW (ref 135–145)

## 2020-11-09 LAB — CULTURE, BLOOD (ROUTINE X 2)
Culture: NO GROWTH
Culture: NO GROWTH
Special Requests: ADEQUATE
Special Requests: ADEQUATE

## 2020-11-09 LAB — MAGNESIUM: Magnesium: 1.9 mg/dL (ref 1.7–2.4)

## 2020-11-09 MED ORDER — LIP MEDEX EX OINT
TOPICAL_OINTMENT | CUTANEOUS | Status: DC | PRN
  Administered 2020-11-11: 75 via TOPICAL
  Administered 2020-11-13: 1 via TOPICAL
  Filled 2020-11-09: qty 7

## 2020-11-09 MED ORDER — ALBUTEROL SULFATE HFA 108 (90 BASE) MCG/ACT IN AERS: 2.0000 | INHALATION_SPRAY | RESPIRATORY_TRACT | Status: DC | PRN

## 2020-11-09 MED ORDER — ADULT MULTIVITAMIN W/MINERALS CH
1.0000 | ORAL_TABLET | Freq: Every day | ORAL | Status: DC
  Administered 2020-11-10 – 2020-11-13 (×4): 1 via ORAL
  Filled 2020-11-09 (×4): qty 1

## 2020-11-09 MED ORDER — BIOTIN 5000 MCG PO CAPS: 5000.0000 ug | ORAL_CAPSULE | Freq: Every evening | ORAL | Status: DC

## 2020-11-09 MED ORDER — PREDNISONE 1 MG PO TABS
4.0000 mg | ORAL_TABLET | Freq: Every day | ORAL | Status: DC
  Administered 2020-11-09 – 2020-11-14 (×6): 4 mg via ORAL
  Filled 2020-11-09 (×6): qty 4

## 2020-11-09 NOTE — Progress Notes (Addendum)
PROGRESS NOTE    Andrea Yu  YDX:[id number] DOB: [birth year] DOA: 10/29/2020 PCP: Jarome Matin, MD   Chief Complaint  Patient presents with   Dehydration   Brief Narrative/Hospital Course:  [name], 80 y.o. female with PMH of A. fib not on anticoagulation, GERD, neuropathy, morbid obesity, psoriatic arthritis, iron deficiency anemia history of subarachnoid hemorrhage, HTN presented to the ED with urinary frequency, decreased oral intake, after recently completed Bactrim course for dysuria. She was seen in the ED found to have leukocytosis tachycardia tachypnea fever hypotension and diagnosed with UTI, CT abdomen pelvis showed fluid collection inferior portion of the right buttock region, inferior to the right ischial tuberosity, blood cultures were obtained and admitted on IV antibiotics.  ID was consulted.  Blood culture 10/5 with positive for Streptococcus para sanguinous 1 out of 4 bottles, felt contaminant. Hospital course was complicated by recurrent fever without clear source and also had episode of altered mental status 10/13 code stroke activated, patient returned to baseline.  She had further work-up with TEE 10/14 for her fever and endocarditis was ruled out.  CT chest abdomen pelvis 10/14 unremarkable for any acute finding, seen by IR and no indication for aspiration of the fluid.  Vancomycin was discontinued 10/15 after negative CT abdomen.  Subjective: Seen and examined this morning. Alert, awake oriented to self/sob. Location, current president , current day On RA, HR 120s a fib Not eating much, gets full, having BM Abel to pivot No fever, wbc down to 7.5k, k better and sodium at 133  Assessment & Plan:  Intermittent fever with unclear cause: Sepsis suspect on admission-urine culture multiple species, blood culture with contamination otherwise clear repeat blood culture 10/11 negative.Unclear source of fever TEE negative for endocarditis, interarterial sound  present.  Vascular duplex negative for DVT in early.  Patient was on ceftriaxone changed to IV vancomycin due to concern for drug fever.  Off vancomycin 10/15 repeat CT abdomen pelvis 10/14 showed similar size of fluid collection in the right buttock-no indication for drainage as per IR.  At this time afebrile no leukocytosis ID signed off.    Streptococcus parasanguinis 1/4 bottle likely a contaminant.  Acute metabolic encephalopathy multifactorial CT head 10/10 chronic small vessel disease at this time mental status stable at baseline.  Minimize psychotropic medication  Refractory hypokalemia: Resolved Mild hypovolemic hyponatremia-stable at 133 [place] metabolic acidosis bicarb 18 encourage oral intake  Chronic diastolic CHF: Euvolemic.  Weight has improved since admission Net IO Since Admission: 3,042.9 mL [11/09/20 1441]  Filed Weights   10/30/20 0700 11/04/20 0327 11/08/20 0311  Weight: 83 kg 86.9 kg 74.8 kg   Chronic anxiety/depression: Continue home Cymbalta  GERD on PPI  Anemia likely from chronic disease: Hemoglobin is stable. Recent Labs  Lab 11/03/20 0907 11/04/20 0331 11/06/20 0451 11/08/20 0454  HGB 9.8* 10.1* 10.8* 9.5*  HCT 30.4* 31.0* 33.2* 29.7*    Generalized deconditioning/physical debility: Multifactorial with comorbidities.  Continue PT OT, skilled nursing facility has been recommended TOC working on it  Adrenal insufficiency history it appears she was on prednisone-resume.  PAF not on anticoagulation.  Rate borderline controlled on metoprolol 50.  Off Cardizem gtt.  History of SAH.  Continue metoprolol  Psoriatic arthritis -Tylenol, diclofenac gel Peripheral neuropathy-supportive care resume home Neurontin Hx of SAH  Essential hypertension: BP stable on metoprolol.  Holding ?home losartan  GERD:on ppi  Class II Obesity:Patient's Body mass index is 33.31 kg/m. : Will benefit with PCP follow-up, weight loss  healthy lifestyle and outpatient sleep  evaluation.  Pressure injury right buttock healed. Pressure Injury 10/30/20 Buttocks Right healed (Active)  10/30/20 2000  Location: Buttocks  Location Orientation: Right  Staging:   Wound Description (Comments): healed  Present on Admission: Yes    DVT prophylaxis:  Code Status:   Code Status: Full Code Family Communication: plan of care discussed with patient at bedside. Status is: Inpatient Remains inpatient appropriate because: Unsafe disposition, pending skilled nursing facility placement  She reports she is mostly on [facility], lives with family Anticipated discharge to skilled nursing facility  Objective: Vitals: Today's Vitals   11/08/20 1955 11/09/20 0720 11/09/20 0815 11/09/20 1159  BP: 116/64  135/74 111/69  Pulse:   87 96  Resp: 20  20 20   Temp: 98.3 F (36.8 C)  97.8 F (36.6 C) 98.3 F (36.8 C)  TempSrc: Oral  Oral Oral  SpO2:   100% 97%  Weight:      Height:      PainSc: 0-No pain Asleep     Physical Examination: General exam: Aa0x3, follows commands, weak,older than stated age. HEENT:Oral mucosa moist, Ear/Nose WNL grossly,dentition normal. Respiratory system: B/l clear BS, no use of accessory muscle, non tender. Cardiovascular system: S1 & S2 +,No JVD. Gastrointestinal system: Abdomen soft, NT,ND, BS+. Nervous System:Alert, awake, moving all extremities, abel to move legs but generalized weakenss. Extremities: edema none, distal peripheral pulses palpable.  Skin: No rashes, no icterus. MSK: Normal muscle bulk, tone, power.  Medications reviewed:  Scheduled Meds:  aspirin EC  81 mg Oral QPM   Biotin  5,000 mcg Oral QPM   docusate sodium  100 mg Oral BID   DULoxetine  90 mg Oral Daily   feeding supplement  237 mL Oral BID BM   influenza vaccine adjuvanted  0.5 mL Intramuscular Tomorrow-1000   mouth rinse  15 mL Mouth Rinse BID   metoprolol succinate  50 mg Oral Daily   multivitamin with minerals  1 tablet Oral Daily   nystatin  5 mL Oral QID    pantoprazole  40 mg Oral Daily   predniSONE  4 mg Oral Daily   Continuous Infusions:  sodium chloride Stopped (11/09/20 1436)   Diet Order             DIET DYS 3 Room service appropriate? Yes; Fluid consistency: Thin  Diet effective now                 Intake/Output  Intake/Output Summary (Last 24 hours) at 11/09/2020 1441 Last data filed at 11/09/2020 1312 Gross per 24 hour  Intake 750 ml  Output 550 ml  Net 200 ml   Intake/Output from previous day: 10/15 0701 - 10/16 0700 In: 1710 [P.O.:1560; IV Piggyback:150] Out: -  Net IO Since Admission: 3,042.9 mL [11/09/20 1441]   Weight change:   Wt Readings from Last 3 Encounters:  11/08/20 74.8 kg  12/13/19 83 kg  05/25/18 65.3 kg     Consultants:see note  Procedures:see note Antimicrobials: Anti-infectives (From admission, onward)    Start     Dose/Rate Route Frequency Ordered Stop   11/06/20 1100  vancomycin (VANCOREADY) IVPB 750 mg/150 mL  Status:  Discontinued        750 mg 150 mL/hr over 60 Minutes Intravenous Every 24 hours 11/05/20 0926 11/08/20 1308   11/05/20 1100  vancomycin (VANCOREADY) IVPB 1750 mg/350 mL        1,750 mg 175 mL/hr over 120 Minutes Intravenous  Once 11/05/20 0926  11/05/20 1321   10/31/20 1630  vancomycin (VANCOCIN) IVPB 1000 mg/200 mL premix  Status:  Discontinued        1,000 mg 200 mL/hr over 60 Minutes Intravenous Every 48 hours 10/30/20 1056 10/30/20 1350   10/31/20 1200  cefTRIAXone (ROCEPHIN) 1 g in sodium chloride 0.9 % 100 mL IVPB  Status:  Discontinued        1 g 200 mL/hr over 30 Minutes Intravenous Every 24 hours 10/31/20 0812 10/31/20 0936   10/31/20 1200  cefTRIAXone (ROCEPHIN) 2 g in sodium chloride 0.9 % 100 mL IVPB  Status:  Discontinued        2 g 200 mL/hr over 30 Minutes Intravenous Every 24 hours 10/31/20 0936 11/05/20 0926   10/30/20 1930  aztreonam (AZACTAM) 1 g in sodium chloride 0.9 % 100 mL IVPB  Status:  Discontinued        1 g 200 mL/hr over 30 Minutes  Intravenous Every 8 hours 10/30/20 1857 10/31/20 0812   10/30/20 0000  aztreonam (AZACTAM) 2 g in sodium chloride 0.9 % 100 mL IVPB  Status:  Discontinued        2 g 200 mL/hr over 30 Minutes Intravenous Every 8 hours 10/29/20 1829 10/30/20 1350   10/29/20 1515  aztreonam (AZACTAM) 2 g in sodium chloride 0.9 % 100 mL IVPB        2 g 200 mL/hr over 30 Minutes Intravenous  Once 10/29/20 1502 10/29/20 1617   10/29/20 1515  metroNIDAZOLE (FLAGYL) IVPB 500 mg        500 mg 100 mL/hr over 60 Minutes Intravenous  Once 10/29/20 1502 10/29/20 1740   10/29/20 1515  vancomycin (VANCOREADY) IVPB 1250 mg/250 mL        1,250 mg 166.7 mL/hr over 90 Minutes Intravenous  Once 10/29/20 1502 10/29/20 1814      Culture/Microbiology    Component Value Date/Time   SDES BLOOD RIGHT HAND 11/04/2020 0800   SPECREQUEST  11/04/2020 0800    BOTTLES DRAWN AEROBIC AND ANAEROBIC Blood Culture adequate volume   CULT  11/04/2020 0800    NO GROWTH 5 DAYS Performed at [facility], [address] [place], [place] [address]    REPTSTATUS 11/09/2020 FINAL 11/04/2020 0800    Other culture-see note  Unresulted Labs (From admission, onward)     Start     Ordered   11/08/20 0500  Basic metabolic panel  Daily,   R     Question:  Specimen collection method  Answer:  Lab=Lab collect   11/07/20 0829   11/06/20 0500  CBC  Every 48 hours,   R     Question:  Specimen collection method  Answer:  Lab=Lab collect   11/05/20 1910   11/06/20 0500  Basic metabolic panel  Every 48 hours,   R     Question:  Specimen collection method  Answer:  Lab=Lab collect   11/05/20 1910   11/05/20 0500  Creatinine, serum  (enoxaparin (LOVENOX)    CrCl < 30 ml/min)  Weekly,   R     Comments: while on enoxaparin therapy.    10/29/20 1729   10/29/20 1505  Miscellaneous Genetic Test (non-interfaced send-out)  Once,   STAT       Comments: Septicyte   10/29/20 1504   10/29/20 1045  Miscellaneous [facility] test (send-out)  Once,   R         10/29/20 1045  Data Reviewed: I have personally reviewed following labs and imaging studies CBC: Recent Labs  Lab 11/03/20 0907 11/04/20 0331 11/06/20 0451 11/08/20 0454  WBC 11.1* 10.5 10.3 7.8  HGB 9.8* 10.1* 10.8* 9.5*  HCT 30.4* 31.0* 33.2* 29.7*  MCV 85.2 84.5 86.0 85.6  PLT 293 320 314 312   Basic Metabolic Panel: Recent Labs  Lab 11/03/20 0907 11/04/20 0331 11/05/20 0457 11/06/20 0451 11/08/20 0454 11/09/20 0152  NA 133* 135  --  134* 133* 133*  K 2.9* 3.7  --  3.3* 3.2* 4.0  CL 103 108  --  107 108 108  CO2 19* 18*  --  18* 16* 18*  GLUCOSE 90 112*  --  87 83 88  BUN 9 8  --  8 9 12   CREATININE 1.02* 0.95 0.91 0.91 0.93 0.92  CALCIUM 8.1* 8.2*  --  8.4* 8.0* 8.3*  MG 1.7  --   --   --   --  1.9   GFR: Estimated Creatinine Clearance: 42.2 mL/min (by C-G formula based on SCr of 0.92 mg/dL). Liver Function Tests: Recent Labs  Lab 11/05/20 0457  AST 20  ALT 16  ALKPHOS 70  BILITOT 0.6  PROT 5.4*  ALBUMIN 2.0*   No results for input(s): LIPASE, AMYLASE in the last 168 hours. No results for input(s): AMMONIA in the last 168 hours. Coagulation Profile: No results for input(s): INR, PROTIME in the last 168 hours. Cardiac Enzymes: No results for input(s): CKTOTAL, CKMB, CKMBINDEX, TROPONINI in the last 168 hours. BNP (last 3 results) No results for input(s): PROBNP in the last 8760 hours. HbA1C: No results for input(s): HGBA1C in the last 72 hours. CBG: Recent Labs  Lab 11/04/20 0517 11/07/20 0645  GLUCAP 126* 84   Lipid Profile: No results for input(s): CHOL, HDL, LDLCALC, TRIG, CHOLHDL, LDLDIRECT in the last 72 hours. Thyroid Function Tests: No results for input(s): TSH, T4TOTAL, FREET4, T3FREE, THYROIDAB in the last 72 hours. Anemia Panel: No results for input(s): VITAMINB12, FOLATE, FERRITIN, TIBC, IRON, RETICCTPCT in the last 72 hours. Sepsis Labs: No results for input(s): PROCALCITON, LATICACIDVEN in the last 168  hours.  Recent Results (from the past 240 hour(s))  Culture, blood (routine x 2)     Status: None   Collection Time: 10/31/20  2:30 PM   Specimen: BLOOD  Result Value Ref Range Status   Specimen Description BLOOD RIGHT ANTECUBITAL  Final   Special Requests   Final    BOTTLES DRAWN AEROBIC ONLY Blood Culture results may not be optimal due to an inadequate volume of blood received in culture bottles   Culture   Final    NO GROWTH 5 DAYS Performed at [facility], [address], [place], [place]    Report Status 11/05/2020 FINAL  Final  Culture, blood (routine x 2)     Status: None   Collection Time: 10/31/20  2:35 PM   Specimen: BLOOD RIGHT HAND  Result Value Ref Range Status   Specimen Description BLOOD RIGHT HAND  Final   Special Requests   Final    BOTTLES DRAWN AEROBIC ONLY Blood Culture results may not be optimal due to an inadequate volume of blood received in culture bottles   Culture   Final    NO GROWTH 5 DAYS Performed at [facility], [address], [place], [place]    Report Status 11/05/2020 FINAL  Final  Urine Culture     Status: None  Collection Time: 11/04/20  5:50 AM   Specimen: Urine, Clean Catch  Result Value Ref Range Status   Specimen Description URINE, CLEAN CATCH  Final   Special Requests NONE  Final   Culture   Final    NO GROWTH Performed at [facility], [address] [place], [place] [address]    Report Status 11/04/2020 FINAL  Final  Culture, blood (routine x 2)     Status: None   Collection Time: 11/04/20  7:51 AM   Specimen: BLOOD LEFT HAND  Result Value Ref Range Status   Specimen Description BLOOD LEFT HAND  Final   Special Requests   Final    BOTTLES DRAWN AEROBIC AND ANAEROBIC Blood Culture adequate volume   Culture   Final    NO GROWTH 5 DAYS Performed at [facility], [address] [place], [place] [address]    Report Status 11/09/2020 FINAL  Final  Culture, blood (routine x 2)      Status: None   Collection Time: 11/04/20  8:00 AM   Specimen: BLOOD RIGHT HAND  Result Value Ref Range Status   Specimen Description BLOOD RIGHT HAND  Final   Special Requests   Final    BOTTLES DRAWN AEROBIC AND ANAEROBIC Blood Culture adequate volume   Culture   Final    NO GROWTH 5 DAYS Performed at [facility], [address], [place], [place] [address]    Report Status 11/09/2020 FINAL  Final     Radiology Studies: CT CHEST ABDOMEN PELVIS W CONTRAST  Result Date: 11/07/2020 CLINICAL DATA:  Fever unknown of unknown origin EXAM: CT CHEST, ABDOMEN, AND PELVIS WITH CONTRAST TECHNIQUE: Multidetector CT imaging of the chest, abdomen and pelvis was performed following the standard protocol during bolus administration of intravenous contrast. CONTRAST:  OMNIPAQUE IOHEXOL 350 MG/ML SOLN COMPARISON:  CT October 29, 2020. FINDINGS: CT CHEST FINDINGS Cardiovascular: Aortic and branch vessel atherosclerosis without thoracic aortic aneurysm. No central pulmonary embolus on this nondedicated study. Coronary artery calcifications. Normal size heart. No significant pericardial effusion/thickening. Mediastinum/Nodes: No suspicious thyroid nodule. No mediastinal, hilar or axillary adenopathy. The trachea esophagus are unremarkable. Lungs/Pleura: Tiny left greater than right pleural effusions. Hypoventilatory change in the dependent lungs. No pneumothorax. Musculoskeletal: Multilevel degenerative changes spine. No acute osseous abnormality. CT ABDOMEN PELVIS FINDINGS Hepatobiliary: No suspicious hepatic lesion. Gallbladder surgically absent. No biliary ductal dilation. Pancreas: Within normal limits. Spleen: Within normal limits. Adrenals/Urinary Tract: Bilateral adrenal glands are unremarkable. No hydronephrosis. No renal, ureteral or bladder calculus visualized. Right renal cyst. Similar mild right greater than left perinephric stranding. Urinary bladder is unremarkable for degree of distension.  Stomach/Bowel: Stomach is unremarkable. No pathologic dilation of small or large bowel. Gas fluid levels in the colon suggestive of diarrheal illness. No evidence of acute bowel inflammation. Vascular/Lymphatic: Extensive aortic and branch vessel atherosclerosis without aortic aneurysm. No pathologically enlarged abdominal or pelvic lymph nodes. Reproductive: Status post hysterectomy. No adnexal masses. Other: Trace pelvic free fluid. Two moderate-sized fat containing periumbilical hernias. Similar size of the fluid collection seen in the inferior portion the right buttocks inferior to the right ischial tuberosity measuring 6.3 x 2.8 cm previously 6.1 x 3.3 cm. Sequela of subcutaneous injections in the anterior abdominal wall. Musculoskeletal: Multilevel degenerative changes spine. L3-L5 posterior spinal fusion hardware. No acute osseous abnormality. IMPRESSION: 1. Gas fluid levels in the colon suggestive of diarrheal illness. No evidence of acute bowel inflammation or obstruction. 2. Tiny left greater  than right pleural effusions. 3. Similar size of the fluid collection in the inferior portion of the right buttocks inferior to the right ischial tuberosity measuring 6.3 x 2.8 cm which again the has been present on prior examinations and may reflect an abscess correlation with direct visualization suggested. 4. Two moderate fat containing periumbilical hernias. 5. Aortic Atherosclerosis (ICD10-I70.0). Electronically Signed   By: Maudry Mayhew M.D.   On: 11/07/2020 18:39     LOS: 11 days   Lanae Boast, MD [facility]  11/09/2020, 2:41 PM

## 2020-11-09 NOTE — Progress Notes (Signed)
PHARMACIST - PHYSICIAN ORDER COMMUNICATION  CONCERNING: P&T Medication Policy on Herbal Medications  DESCRIPTION:  This patient's order for:  biotin  has been noted.  This product(s) is classified as an "herbal" or natural product. Due to a lack of definitive safety studies or FDA approval, nonstandard manufacturing practices, plus the potential risk of unknown drug-drug interactions while on inpatient medications, the Pharmacy and Therapeutics Committee does not permit the use of "herbal" or natural products of this type within [place].   ACTION TAKEN: The pharmacy department is unable to verify this order at this time and your patient has been informed of this safety policy. Please reevaluate patient's clinical condition at discharge and address if the herbal or natural product(s) should be resumed at that time.    Mariem Skolnick A. Scotti Kosta, PharmD, BCPS, FNKF Clinical Pharmacist [place] Please utilize Amion for appropriate phone number to reach the unit pharmacist ([facility])  

## 2020-11-09 NOTE — Progress Notes (Signed)
ID Brief  Chart reviewed.   Has been afebrile for more than 48 hrs now  No leukocytosis with stable hemodynamics  Vancomycin stopped on 10/15 after negative CT CAP and low suspicion for infection Monitor off antibiotics ID will sign off for now. Please call with questions   Odette Fraction, MD Infectious Disease Physician [facility] [address] [place], [place] [address] Phone: [phone]  Fax: [phone]

## 2020-11-10 DIAGNOSIS — I480 Paroxysmal atrial fibrillation: Secondary | ICD-10-CM | POA: Diagnosis not present

## 2020-11-10 DIAGNOSIS — A419 Sepsis, unspecified organism: Secondary | ICD-10-CM | POA: Diagnosis not present

## 2020-11-10 DIAGNOSIS — R6520 Severe sepsis without septic shock: Secondary | ICD-10-CM | POA: Diagnosis not present

## 2020-11-10 LAB — BASIC METABOLIC PANEL
Anion gap: 6 (ref 5–15)
BUN: 12 mg/dL (ref 8–23)
CO2: 22 mmol/L (ref 22–32)
Calcium: 8.6 mg/dL — ABNORMAL LOW (ref 8.9–10.3)
Chloride: 104 mmol/L (ref 98–111)
Creatinine, Ser: 0.93 mg/dL (ref 0.44–1.00)
GFR, Estimated: >60 mL/min (ref >60)
Glucose, Bld: 105 mg/dL — ABNORMAL HIGH (ref 70–99)
Potassium: 3.8 mmol/L (ref 3.5–5.1)
Sodium: 132 mmol/L — ABNORMAL LOW (ref 135–145)

## 2020-11-10 LAB — CBC
HCT: 30.2 % — ABNORMAL LOW (ref 36.0–46.0)
Hemoglobin: 9.4 g/dL — ABNORMAL LOW (ref 12.0–15.0)
MCH: 27.1 pg (ref 26.0–34.0)
MCHC: 31.1 g/dL (ref 30.0–36.0)
MCV: 87 fL (ref 80.0–100.0)
Platelets: 336 10*3/uL (ref 150–400)
RBC: 3.47 MIL/uL — ABNORMAL LOW (ref 3.87–5.11)
RDW: 15.3 % (ref 11.5–15.5)
WBC: 3.3 10*3/uL — ABNORMAL LOW (ref 4.0–10.5)
nRBC: 0 % (ref 0.0–0.2)

## 2020-11-10 LAB — MISC LABCORP TEST (SEND OUT): Labcorp test code: 96388

## 2020-11-10 MED ORDER — POTASSIUM CHLORIDE CRYS ER 20 MEQ PO TBCR
40.0000 meq | EXTENDED_RELEASE_TABLET | Freq: Every day | ORAL | Status: DC
  Administered 2020-11-10 – 2020-11-14 (×5): 40 meq via ORAL
  Filled 2020-11-10 (×5): qty 2

## 2020-11-10 NOTE — Progress Notes (Addendum)
Progress Note  Patient Name: Andrea [name] Date of Encounter: 11/10/2020  [facility] Cardiologist: Sanda Klein, MD   Subjective   Awake and oriented x 2, denies palpitations, SOB or CP  Inpatient Medications    Scheduled Meds:  aspirin EC  81 mg Oral QPM   docusate sodium  100 mg Oral BID   DULoxetine  90 mg Oral Daily   feeding supplement  237 mL Oral BID BM   influenza vaccine adjuvanted  0.5 mL Intramuscular Tomorrow-1000   mouth rinse  15 mL Mouth Rinse BID   metoprolol succinate  50 mg Oral Daily   multivitamin with minerals  1 tablet Oral Daily   nystatin  5 mL Oral QID   pantoprazole  40 mg Oral Daily   predniSONE  4 mg Oral Daily   Continuous Infusions:   PRN Meds: acetaminophen **OR** acetaminophen, HYDROcodone-acetaminophen, ipratropium-albuterol, lip balm, menthol-cetylpyridinium, metoprolol tartrate, ondansetron **OR** ondansetron (ZOFRAN) IV   Vital Signs    Vitals:   11/09/20 1739 11/09/20 2009 11/10/20 0019 11/10/20 0300  BP: 127/67 (!) 138/59 (!) 115/59 134/64  Pulse: 92 100 80 80  Resp: '18 20 17 17  ' Temp: 97.8 F (36.6 C) 98.2 F (36.8 C) 97.9 F (36.6 C) (!) 97.5 F (36.4 C)  TempSrc: Oral Oral Oral Oral  SpO2: 100% 100% 100% 100%  Weight:      Height:        Intake/Output Summary (Last 24 hours) at 11/10/2020 0932 Last data filed at 11/10/2020 0300 Gross per 24 hour  Intake 240 ml  Output 1350 ml  Net -1110 ml   Last 3 Weights 11/08/2020 11/04/2020 10/30/2020  Weight (lbs) 164 lb 14.5 oz 191 lb 9.3 oz 182 lb 15.7 oz  Weight (kg) 74.8 kg 86.9 kg 83 kg      Telemetry    Afib, HR generally controlled - Personally Reviewed  ECG    No new tracing- Personally Reviewed  Physical Exam   GEN: No acute distress.   Neck: No JVD Cardiac: Irreg R&R, no murmur, no rubs, or gallops.  Respiratory: diminished to auscultation bilateral bases GI: Soft, nontender, non-distended  MS: No edema; No deformity. Neuro:  Nonfocal,  follows commands Psych: Normal affect   Labs    High Sensitivity Troponin:  No results for input(s): TROPONINIHS in the last 720 hours.   Chemistry Recent Labs  Lab 11/05/20 0457 11/06/20 0451 11/08/20 0454 11/09/20 0152 11/10/20 0601  NA  --    < > 133* 133* 132*  K  --    < > 3.2* 4.0 3.8  CL  --    < > 108 108 104  CO2  --    < > 16* 18* 22  GLUCOSE  --    < > 83 88 105*  BUN  --    < > '9 12 12  ' CREATININE 0.91   < > 0.93 0.92 0.93  CALCIUM  --    < > 8.0* 8.3* 8.6*  MG  --   --   --  1.9  --   PROT 5.4*  --   --   --   --   ALBUMIN 2.0*  --   --   --   --   AST 20  --   --   --   --   ALT 16  --   --   --   --   ALKPHOS 70  --   --   --   --  BILITOT 0.6  --   --   --   --   GFRNONAA >60   < > >60 >60 >60  ANIONGAP  --    < > '9 7 6   ' < > = values in this interval not displayed.    Lipids No results for input(s): CHOL, TRIG, HDL, LABVLDL, LDLCALC, CHOLHDL in the last 168 hours.  Hematology Recent Labs  Lab 11/06/20 0451 11/08/20 0454 11/10/20 0601  WBC 10.3 7.8 3.3*  RBC 3.86* 3.47* 3.47*  HGB 10.8* 9.5* 9.4*  HCT 33.2* 29.7* 30.2*  MCV 86.0 85.6 87.0  MCH 28.0 27.4 27.1  MCHC 32.5 32.0 31.1  RDW 15.5 15.8* 15.3  PLT 314 312 336   Thyroid  No results for input(s): TSH, FREET4 in the last 168 hours.   BNP Recent Labs  Lab 11/06/20 0451  BNP 471.6*     Sed Rate  Date Value Ref Range Status  11/03/2020 64 (H) 0 - 22 mm/hr Final    Comment:    Performed at [facility] [address] [place], [place] [address]  01/24/2018 31 (H) 0 - 30 mm/h Final  12/06/2017 43 (H) 0 - 30 mm/h Final        Component Value Date/Time   CRP 22.9 (H) 11/03/2020 0907   CRP 40.1 (H) 01/24/2018 1207   CRP 28.2 (H) 12/06/2017 1605    DDimer No results for input(s): DDIMER in the last 168 hours.   Radiology    No results found.  Cardiac Studies   TEE: 11/07/2020  1. No vegetations noted.   2. Left ventricular ejection fraction, by estimation, is 55  to 60%. The  left ventricle has normal function. The left ventricle has no regional  wall motion abnormalities.   3. Right ventricular systolic function is normal. The right ventricular  size is normal.   4. Left atrial size was moderately dilated. No left atrial/left atrial  appendage thrombus was detected.   5. The mitral valve is normal in structure. Trivial mitral valve  regurgitation.   6. The aortic valve is tricuspid. Aortic valve regurgitation is mild.   7. 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   8. Evidence of atrial level shunting detected by color flow Doppler.  Agitated saline contrast bubble study was positive with shunting observed  within 3-6 cardiac cycles suggestive of interatrial shunt. There is a  small patent foramen ovale.   Transthoracic echocardiogram 11/01/2020    1. Left ventricular ejection fraction, by estimation, is 60 to 65%. The  left ventricle has normal function. The left ventricle has no regional  wall motion abnormalities. Left ventricular diastolic function could not  be evaluated.   2. Right ventricular systolic function is normal. The right ventricular  size is normal. There is mildly elevated pulmonary artery systolic  pressure.   3. Left atrial size was mildly dilated.   4. The mitral valve is normal in structure. Trivial mitral valve  regurgitation.   5. The aortic valve is normal in structure. Aortic valve regurgitation is  mild. Mild aortic valve sclerosis is present, with no evidence of aortic  valve stenosis.   6. The inferior vena cava is dilated in size with >50% respiratory  variability, suggesting right atrial pressure of 8 mmHg.   Patient Profile     80 y.o. female with permanent atrial fibrillation, essential hypertension, psoriatic arthritis, history of neuropathy with orthostatic hypotension and frequent falls presenting with acute  febrile illness, encephalopathy initially felt to be urosepsis, but  subsequently demonstrated to have Streptococcus parasanguinis bacteremia (albeit only 1 of 4 culture bottles)  Assessment & Plan      Encephalopathy:  No CVA on non-contrast head CT, mgt per Neuro. Not anticoagulated pta due to falls w/ SAH. High risk for embolic stroke, no evidence of endocarditis on TEE 10/14. May need MRI head to clarify.    Sepsis: No evidence of endocarditis on TEE, s/p prolonged course ABX, Afebrile last 48 hr. W/ hx psoriatic arthritis, ESR and CRP not much help. ESR is higher than baseline, CRP a little lower  AFib: HR controlled on metoprolol XL 50 mg qd, off Dilt since 10/16. LE dopplers neg DVT    Hypokalemia:N K+ 3.2 >> 4.0 >> 3.8, Will add daily K dur 40 meq, she got 80 meq 10/15, none 10/16        For questions or updates, please contact [place] Please consult [email or web address] for contact info under        Signed, Rosaria Ferries, PA-C  11/10/2020, 9:32 AM    Less encephalopathic by notes. Afib rate control ok. No anticoagulaiton with age, falls and history SAH.  Continue Toprol  Cardiology will follow from a distance Can f/u with Dr C not clear that she would be a Watchman candidate either   Jenkins Rouge MD [facility]

## 2020-11-10 NOTE — Progress Notes (Addendum)
Andrea NOTE    Andrea Andrea  BOF:[id number] DOB: 05-04-1940 DOA: 10/29/2020 PCP: Jarome Matin, Andrea   Chief Complaint  Patient presents with   Dehydration   Brief Narrative/Hospital Course:  Andrea Andrea, 80 y.o. female with PMH Andrea Andrea not on Andrea, Andrea Andrea, Andrea Andrea, Andrea Andrea, Andrea Andrea, Andrea Andrea subarachnoid hemorrhage, HTN presented to the ED with urinary frequency, decreased oral intake, after recently completed Bactrim course for dysuria. She was seen in the ED found to have leukocytosis tachycardia tachypnea fever hypotension and diagnosed with UTI, CT abdomen pelvis showed fluid collection inferior portion Andrea the right buttock region, inferior to the right ischial tuberosity, blood cultures were obtained and admitted on IV antibiotics.  ID was consulted.  Blood culture 10/5 with positive for Streptococcus para sanguinous 1 out Andrea 4 bottles, felt contaminant. Hospital course was complicated by recurrent fever without clear source and also had episode Andrea altered mental status 10/13 code stroke activated, patient returned to baseline.  She had further work-up with TEE 10/14 for her fever and endocarditis was ruled out.  CT chest abdomen pelvis 10/14 unremarkable for any acute finding, seen by IR and no indication for aspiration Andrea the fluid.  Vancomycin was discontinued 10/15 after negative CT abdomen.  Subjective: Seen this morning.  Woke up from sleep.  She is alert awake able to answer questions however location  Speech somewhat difficult to understand at times. No fever since 10/14.  WBC count downtrended.  Assessment & Plan:  Intermittent fever with unclear cause: Sepsis suspected on admission and was managed IV antibiotics, suspected UTI.urine culture multiple species, blood culture with contamination otherwise repeat repeat blood culture 10/11 negative.Unclear source Andrea fever TEE negative for endocarditis, interarterial  shunt present-but vascular duplex negative for DVT/not a candidate for Andrea. Patient was on ceftriaxone changed to IV vancomycin due to concern for drug fever. And Off vancomycin 10/15 as repeat CT abdomen pelvis 10/14 showed similar size Andrea fluid collection in the right buttock-no indication for drainage as per IR.  At this time afebrile no leukocytosis ID signed off.    Streptococcus parasanguinis 1/4 bottle likely a contaminant.  Acute metabolic encephalopathy multifactorial CT head 10/10 chronic small vessel disease at this time mental status stable appears to be overall improving although remains deconditioned and weak and frail. Seen by neurology- if mentation does not clear after fever improves can consider MRI.Continue PT OT, will need a skilled nursing facility placement.  Minimize psychotropic medication.  Keep on delirium precaution.    Refractory hypokalemia: Resolved.  Continue 40 Andrea Kdur daily. Recent Labs  Lab 11/04/20 0331 11/06/20 0451 11/08/20 0454 11/09/20 0152 11/10/20 0601  K 3.7 3.3* 3.2* 4.0 3.8    Mild hypovolemic hyponatremia-stable. [place] metabolic acidosis bicarb improved  Chronic diastolic CHF: Euvolemic. Weight has improved since admission.  Seen by cardiology. Net IO Since Admission: 1,692.9 mL [11/10/20 1134]  Filed Weights   10/30/20 0700 11/04/20 0327 11/08/20 0311  Weight: 83 kg 86.9 kg 74.8 kg   Chronic anxiety/depression: Continue home Cymbalta.  Mood is stable.  Andrea Andrea on PPI  Anemia likely from chronic disease: Hemoglobin is stable cont outpatient follow-up Recent Labs  Lab 11/04/20 0331 11/06/20 0451 11/08/20 0454 11/10/20 0601  HGB 10.1* 10.8* 9.5* 9.4*  HCT 31.0* 33.2* 29.7* 30.2*     Generalized deconditioning/physical debility: Multifactorial with comorbidities.  Continue PT OT, skilled nursing facility has been recommended TOC working on it-and notified.  Adrenal insufficiency history it  appears she was on  prednisone-continue.  PAF not on Andrea.  Rate well controlled at this time on metoprolol  Off Cardizem gtt.  History Andrea SAH/anemia/fall-not a candidate for Andrea per Cariology- signed off 10/17  Andrea Andrea -Tylenol, diclofenac gel Peripheral Andrea Andrea-supportive care , home Neurontin Hx Andrea SAH  Essential hypertension: Controlled, continue metoprolol.  Holding ?home losartan  Andrea Andrea:on ppi  Class II Andrea:Patient's Body mass index is 33.31 kg/m. : Will benefit with PCP follow-up, weight loss  healthy lifestyle and outpatient sleep evaluation.  Pressure injury right buttock healed. Pressure Injury 10/30/20 Buttocks Right healed (Active)  10/30/20 2000  Location: Buttocks  Location Orientation: Right  Staging:   Wound Description (Comments): healed  Present on Admission: Yes    DVT prophylaxis:  Code Status:   Code Status: Full Code Family Communication: plan Andrea care discussed with patient at bedside. Status is: Inpatient Remains inpatient appropriate because: Unsafe disposition, pending skilled nursing facility placement Called daughter's cell phone and home phone no answer. TOC notified- She will reach out to the daughter to discuss.  She reports she is mostly on [facility], lives with family Anticipated discharge to skilled nursing facility in a day or two  Objective: Vitals: Today's Vitals   11/10/20 0019 11/10/20 0300 11/10/20 0425 11/10/20 0703  BP: (!) 115/59 134/64    Pulse: 80 80    Resp: 17 17    Temp: 97.9 F (36.6 C) (!) 97.5 F (36.4 C)    TempSrc: Oral Oral    SpO2: 100% 100%    Weight:      Height:      PainSc:   Asleep Asleep   Physical Examination: General exam: AAO to self current place people, month/year, appears at baseline, elderly.   HEENT:Oral mucosa moist, Ear/Nose WNL grossly, dentition normal. Respiratory system: bilaterally diminished,  no use Andrea accessory muscle Cardiovascular system: S1 & S2 +, irregularly  irregular, no JVD,. Gastrointestinal system: Abdomen soft, NT,ND, BS+ Nervous System:Alert, awake, moving extremities and grossly nonfocal Extremities: no edema, distal peripheral pulses palpable.  Skin: No rashes,no icterus. MSK: Normal muscle bulk,tone, power .  Medications reviewed:  Scheduled Meds:  aspirin EC  81 mg Oral QPM   docusate sodium  100 mg Oral BID   DULoxetine  90 mg Oral Daily   feeding supplement  237 mL Oral BID BM   influenza vaccine adjuvanted  0.5 mL Intramuscular Tomorrow-1000   mouth rinse  15 mL Mouth Rinse BID   metoprolol succinate  50 mg Oral Daily   multivitamin with minerals  1 tablet Oral Daily   nystatin  5 mL Oral QID   pantoprazole  40 mg Oral Daily   potassium chloride  40 mEq Oral Daily   predniSONE  4 mg Oral Daily   Continuous Infusions:   Diet Order             DIET DYS 3 Room service appropriate? Yes; Fluid consistency: Thin  Diet effective now                 Intake/Output  Intake/Output Summary (Last Andrea hours) at 11/10/2020 1134 Last data filed at 11/10/2020 0300 Gross per Andrea hour  Intake 240 ml  Output 1350 ml  Net -1110 ml    Intake/Output from previous day: 10/16 0701 - 10/17 0700 In: 600 [P.O.:600] Out: 1900 [Urine:1900] Net IO Since Admission: 1,692.9 mL [11/10/20 1134]   Weight change:   Wt Readings from Last 3 Encounters:  11/08/20 74.8 kg  12/13/19 83 kg  05/25/18 65.3 kg     Consultants:see note  Procedures:see note Antimicrobials: Anti-infectives (From admission, onward)    Start     Dose/Rate Route Frequency Ordered Stop   11/06/20 1100  vancomycin (VANCOREADY) IVPB 750 mg/150 mL  Status:  Discontinued        750 mg 150 mL/hr over 60 Minutes Intravenous Every Andrea hours 11/05/20 0926 11/08/20 1308   11/05/20 1100  vancomycin (VANCOREADY) IVPB 1750 mg/350 mL        1,750 mg 175 mL/hr over 120 Minutes Intravenous  Once 11/05/20 0926 11/05/20 1321   10/31/20 1630  vancomycin (VANCOCIN) IVPB 1000  mg/200 mL premix  Status:  Discontinued        1,000 mg 200 mL/hr over 60 Minutes Intravenous Every 48 hours 10/30/20 1056 10/30/20 1350   10/31/20 1200  cefTRIAXone (ROCEPHIN) 1 g in sodium chloride 0.9 % 100 mL IVPB  Status:  Discontinued        1 g 200 mL/hr over 30 Minutes Intravenous Every Andrea hours 10/31/20 0812 10/31/20 0936   10/31/20 1200  cefTRIAXone (ROCEPHIN) 2 g in sodium chloride 0.9 % 100 mL IVPB  Status:  Discontinued        2 g 200 mL/hr over 30 Minutes Intravenous Every Andrea hours 10/31/20 0936 11/05/20 0926   10/30/20 1930  aztreonam (AZACTAM) 1 g in sodium chloride 0.9 % 100 mL IVPB  Status:  Discontinued        1 g 200 mL/hr over 30 Minutes Intravenous Every 8 hours 10/30/20 1857 10/31/20 0812   10/30/20 0000  aztreonam (AZACTAM) 2 g in sodium chloride 0.9 % 100 mL IVPB  Status:  Discontinued        2 g 200 mL/hr over 30 Minutes Intravenous Every 8 hours 10/29/20 1829 10/30/20 1350   10/29/20 1515  aztreonam (AZACTAM) 2 g in sodium chloride 0.9 % 100 mL IVPB        2 g 200 mL/hr over 30 Minutes Intravenous  Once 10/29/20 1502 10/29/20 1617   10/29/20 1515  metroNIDAZOLE (FLAGYL) IVPB 500 mg        500 mg 100 mL/hr over 60 Minutes Intravenous  Once 10/29/20 1502 10/29/20 1740   10/29/20 1515  vancomycin (VANCOREADY) IVPB 1250 mg/250 mL        1,250 mg 166.7 mL/hr over 90 Minutes Intravenous  Once 10/29/20 1502 10/29/20 1814      Culture/Microbiology    Component Value Date/Time   SDES BLOOD RIGHT HAND 11/04/2020 0800   SPECREQUEST  11/04/2020 0800    BOTTLES DRAWN AEROBIC AND ANAEROBIC Blood Culture adequate volume   CULT  11/04/2020 0800    NO GROWTH 5 DAYS Performed at [facility], [address] [place], [place] [address]    REPTSTATUS 11/09/2020 FINAL 11/04/2020 0800    Other culture-see note  Unresulted Labs (From admission, onward)     Start     Ordered   11/08/20 0500  Basic metabolic panel  Daily,   R     Question:  Specimen collection  method  Answer:  Lab=Lab collect   11/07/20 0829   11/06/20 0500  CBC  Every 48 hours,   R     Question:  Specimen collection method  Answer:  Lab=Lab collect   11/05/20 1910   11/06/20 0500  Basic metabolic panel  Every 48 hours,   R     Question:  Specimen collection method  Answer:  Lab=Lab  collect   11/05/20 1910   11/05/20 0500  Creatinine, serum  (enoxaparin (LOVENOX)    CrCl < 30 ml/min)  Weekly,   R     Comments: while on enoxaparin therapy.    10/29/20 1729   10/29/20 1505  Miscellaneous Genetic Test (non-interfaced send-out)  Once,   STAT       Comments: Septicyte   10/29/20 1504   10/29/20 1045  Miscellaneous [facility] test (send-out)  Once,   R        10/29/20 1045           Data Reviewed: I have personally reviewed following labs and imaging studies CBC: Recent Labs  Lab 11/04/20 0331 11/06/20 0451 11/08/20 0454 11/10/20 0601  WBC 10.5 10.3 7.8 3.3*  HGB 10.1* 10.8* 9.5* 9.4*  HCT 31.0* 33.2* 29.7* 30.2*  MCV 84.5 86.0 85.6 87.0  PLT 320 314 312 336    Basic Metabolic Panel: Recent Labs  Lab 11/04/20 0331 11/05/20 0457 11/06/20 0451 11/08/20 0454 11/09/20 0152 11/10/20 0601  NA 135  --  134* 133* 133* 132*  K 3.7  --  3.3* 3.2* 4.0 3.8  CL 108  --  107 108 108 104  CO2 18*  --  18* 16* 18* 22  GLUCOSE 112*  --  87 83 88 105*  BUN 8  --  8 9 12 12   CREATININE 0.95 0.91 0.91 0.93 0.92 0.93  CALCIUM 8.2*  --  8.4* 8.0* 8.3* 8.6*  MG  --   --   --   --  1.9  --     GFR: Estimated Creatinine Clearance: 41.8 mL/min (by C-G formula based on SCr Andrea 0.93 mg/dL). Liver Function Tests: Recent Labs  Lab 11/05/20 0457  AST 20  ALT 16  ALKPHOS 70  BILITOT 0.6  PROT 5.4*  ALBUMIN 2.0*    No results for input(s): LIPASE, AMYLASE in the last 168 hours. No results for input(s): AMMONIA in the last 168 hours. Coagulation Profile: No results for input(s): INR, PROTIME in the last 168 hours. Cardiac Enzymes: No results for input(s): CKTOTAL, CKMB,  CKMBINDEX, TROPONINI in the last 168 hours. BNP (last 3 results) No results for input(s): PROBNP in the last 8760 hours. HbA1C: No results for input(s): HGBA1C in the last 72 hours. CBG: Recent Labs  Lab 11/04/20 0517 11/07/20 0645  GLUCAP 126* 84    Lipid Profile: No results for input(s): CHOL, HDL, LDLCALC, TRIG, CHOLHDL, LDLDIRECT in the last 72 hours. Thyroid Function Tests: No results for input(s): TSH, T4TOTAL, FREET4, T3FREE, THYROIDAB in the last 72 hours. Anemia Panel: No results for input(s): VITAMINB12, FOLATE, FERRITIN, TIBC, Andrea, RETICCTPCT in the last 72 hours. Sepsis Labs: No results for input(s): PROCALCITON, LATICACIDVEN in the last 168 hours.  Recent Results (from the past 240 hour(s))  Culture, blood (routine x 2)     Status: None   Collection Time: 10/31/20  2:30 PM   Specimen: BLOOD  Result Value Ref Range Status   Specimen Description BLOOD RIGHT ANTECUBITAL  Final   Special Requests   Final    BOTTLES DRAWN AEROBIC ONLY Blood Culture results may not be optimal due to an inadequate volume Andrea blood received in culture bottles   Culture   Final    NO GROWTH 5 DAYS Performed at [facility], [address], [place], [place]    Report Status 11/05/2020 FINAL  Final  Culture, blood (routine x 2)  Status: None   Collection Time: 10/31/20  2:35 PM   Specimen: BLOOD RIGHT HAND  Result Value Ref Range Status   Specimen Description BLOOD RIGHT HAND  Final   Special Requests   Final    BOTTLES DRAWN AEROBIC ONLY Blood Culture results may not be optimal due to an inadequate volume Andrea blood received in culture bottles   Culture   Final    NO GROWTH 5 DAYS Performed at [facility], [address] [place], [place] [address]    Report Status 11/05/2020 FINAL  Final  Urine Culture     Status: None   Collection Time: 11/04/20  5:50 AM   Specimen: Urine, Clean Catch  Result Value Ref Range Status   Specimen Description URINE, CLEAN  CATCH  Final   Special Requests NONE  Final   Culture   Final    NO GROWTH Performed at [facility], [address], [place], [place] [address]    Report Status 11/04/2020 FINAL  Final  Culture, blood (routine x 2)     Status: None   Collection Time: 11/04/20  7:51 AM   Specimen: BLOOD LEFT HAND  Result Value Ref Range Status   Specimen Description BLOOD LEFT HAND  Final   Special Requests   Final    BOTTLES DRAWN AEROBIC AND ANAEROBIC Blood Culture adequate volume   Culture   Final    NO GROWTH 5 DAYS Performed at [facility], [address], [place], [place] [address]    Report Status 11/09/2020 FINAL  Final  Culture, blood (routine x 2)     Status: None   Collection Time: 11/04/20  8:00 AM   Specimen: BLOOD RIGHT HAND  Result Value Ref Range Status   Specimen Description BLOOD RIGHT HAND  Final   Special Requests   Final    BOTTLES DRAWN AEROBIC AND ANAEROBIC Blood Culture adequate volume   Culture   Final    NO GROWTH 5 DAYS Performed at [facility], [address], [place], [place] [address]    Report Status 11/09/2020 FINAL  Final      Radiology Studies: No results found.   LOS: 12 days   Lanae Boast, Andrea [facility]  11/10/2020, 11:34 AM

## 2020-11-10 NOTE — TOC Progression Note (Addendum)
Transition of Care [facility]) - Progression Note    Patient Details  Name: Andrea [name] MRN: [id number] Date of Birth: May 05, 1940  Transition of [facility] CM/SW Contact  Andrea [name], [place] Phone Number: 11/10/2020, 5:32 PM  Clinical Narrative:     called patient's daughter- left voice message to call CSW Called patient's son, Andrea [name]- left voice message to return call.   Andrea [name], MSW, LCSW Clinical Social Worker     Expected Discharge Plan: Home w Home Health Services Barriers to Discharge: Continued Medical Work up  Expected Discharge Plan and Services Expected Discharge Plan: Home w Home Health Services In-house Referral: Clinical Social Work                                             Social Determinants of Health (SDOH) Interventions    Readmission Risk Interventions No flowsheet data found.

## 2020-11-10 NOTE — Care Management Important Message (Signed)
Important Message  Patient Details  Name: Andrea [name] MRN: [id number] Date of Birth: March 17, 1940   Medicare Important Message Given:  Yes     Renie Ora 11/10/2020, 10:47 AM

## 2020-11-11 DIAGNOSIS — R6520 Severe sepsis without septic shock: Secondary | ICD-10-CM | POA: Diagnosis not present

## 2020-11-11 DIAGNOSIS — A419 Sepsis, unspecified organism: Secondary | ICD-10-CM | POA: Diagnosis not present

## 2020-11-11 MED ORDER — ENSURE ENLIVE PO LIQD: 237.0000 mL | Freq: Two times a day (BID) | ORAL | 12 refills | Status: AC

## 2020-11-11 MED ORDER — SODIUM CHLORIDE 0.9 % IV SOLN: INTRAVENOUS | Status: DC

## 2020-11-11 MED ORDER — NYSTATIN 100000 UNIT/ML MT SUSP: 5.0000 mL | Freq: Four times a day (QID) | OROMUCOSAL | 0 refills | Status: AC

## 2020-11-11 NOTE — [place] FL2 (Signed)
[place] MEDICAID FL2 LEVEL OF CARE SCREENING TOOL     IDENTIFICATION  Patient Name: Andrea Yu Birthdate: 04/27/1940 Sex: female Admission Date (Current Location): 10/29/2020  [facility] and [place] Number:  Producer, television/film/video and Address:  [facility] [place], [address] [place], [place] [address]      Provider Number: [id number]  Attending Physician Name and Address:  Lanae Boast, MD  Relative Name and Phone Number:       Current Level of Care:   Recommended Level of Care: Skilled Nursing Facility Prior Approval Number:    Date Approved/Denied:   PASRR Number: [id number]  Discharge Plan: SNF    Current Diagnoses: Patient Active Problem List   Diagnosis Date Noted   Bacteremia    Severe sepsis (HCC) 10/29/2020   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/15/2020   Fatigue 08/15/2020   Polyarthralgia 08/15/2020   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   Permanent atrial fibrillation (HCC) 05/17/2019   Dyslipidemia (high LDL; low HDL) 05/17/2019   S/P PICC central line placement 01/24/2018   Diarrhea 12/14/2017   Left buttock abscess 12/06/2017   Discitis of thoracic region 05/12/2017   Medication monitoring encounter 05/12/2017   Hx of adverse drug reaction    History of blood transfusion    GERD (gastroesophageal reflux disease)    Dysrhythmia    Complication of anesthesia    Arthritis    Anemia    Displaced spiral fracture of shaft of right tibia, subsequent encounter for closed fracture with delayed healing 11/11/2016   Status post ankle arthrodesis 01/07/2016   Osteomyelitis of pelvis (HCC)    SAH (subarachnoid hemorrhage) (HCC) 09/14/2014   Essential hypertension 09/14/2014   Obesity (BMI 30-39.9)- suspected sleep apnea, declines sleep study 11/10/2012   Psoriatic arthritis (HCC) 11/06/2012   Iron deficiency anemia- transfused this admission 11/06/2012   Depression 11/06/2012   Peripheral neuropathy 11/06/2012   Adrenal  insufficiency (HCC) 08/09/2012   PAF (paroxysmal atrial fibrillation) (HCC) 08/09/2012    Orientation RESPIRATION BLADDER Height & Weight     Self  Normal Incontinent Weight: 164 lb 14.5 oz (74.8 kg) Height:  4\' 11"  (149.9 cm)  BEHAVIORAL SYMPTOMS/MOOD NEUROLOGICAL BOWEL NUTRITION STATUS      Incontinent Diet  AMBULATORY STATUS COMMUNICATION OF NEEDS Skin   Extensive Assist Verbally PU Stage and Appropriate Care (buttocks)                       Personal Care Assistance Level of Assistance  Feeding, Dressing, Bathing Bathing Assistance: Maximum assistance Feeding assistance: Maximum assistance Dressing Assistance: Maximum assistance     Functional Limitations Info  Sight, Hearing, Speech Sight Info: Adequate Hearing Info: Adequate      SPECIAL CARE FACTORS FREQUENCY  PT (By licensed PT), OT (By licensed OT)     PT Frequency: 5x a week OT Frequency: 5x a week            Contractures Contractures Info: Not present    Additional Factors Info  Code Status, Allergies Code Status Info: full Allergies Info: Penicillins   Prolia (Denosumab)   Ceftriaxone   Ciprofloxacin Hcl   Fosamax (Alendronate Sodium)   Latex   Lisinopril   Tubersol (Tuberculin Ppd)           Current Medications (11/11/2020):  This is the current hospital active medication list Current Facility-Administered Medications  Medication Dose Route Frequency Provider Last Rate Last Admin   acetaminophen (TYLENOL) tablet 650 mg  650 mg Oral Q6H PRN Lewayne Bunting, MD   650 mg at 11/11/20 0131   Or   acetaminophen (TYLENOL) suppository 650 mg  650 mg Rectal Q6H PRN Lewayne Bunting, MD   650 mg at 11/06/20 1141   aspirin EC tablet 81 mg  81 mg Oral QPM Lewayne Bunting, MD   81 mg at 11/10/20 1645   docusate sodium (COLACE) capsule 100 mg  100 mg Oral BID Lewayne Bunting, MD   100 mg at 11/11/20 1038   DULoxetine (CYMBALTA) DR capsule 90 mg  90 mg Oral Daily Lewayne Bunting, MD   90 mg at  11/11/20 1038   feeding supplement (ENSURE ENLIVE / ENSURE PLUS) liquid 237 mL  237 mL Oral BID BM Dow Adolph N, DO   237 mL at 11/11/20 1447   HYDROcodone-acetaminophen (NORCO/VICODIN) 5-325 MG per tablet 1 tablet  1 tablet Oral Q6H PRN Lewayne Bunting, MD   1 tablet at 11/11/20 1442   influenza vaccine adjuvanted (FLUAD) injection 0.5 mL  0.5 mL Intramuscular Tomorrow-1000 Hall, Carole N, DO       ipratropium-albuterol (DUONEB) 0.5-2.5 (3) MG/3ML nebulizer solution 3 mL  3 mL Nebulization Q6H PRN Lewayne Bunting, MD       lip balm (CARMEX) ointment   Topical PRN Lanae Boast, MD   75 application at 11/11/20 0132   MEDLINE mouth rinse  15 mL Mouth Rinse BID Dow Adolph N, DO   15 mL at 11/11/20 1039   menthol-cetylpyridinium (CEPACOL) lozenge 3 mg  1 lozenge Oral PRN Lewayne Bunting, MD   3 mg at 11/05/20 1819   metoprolol succinate (TOPROL-XL) 24 hr tablet 50 mg  50 mg Oral Daily Lewayne Bunting, MD   50 mg at 11/11/20 1038   metoprolol tartrate (LOPRESSOR) injection 5 mg  5 mg Intravenous Q8H PRN Lewayne Bunting, MD   5 mg at 11/11/20 1529   multivitamin with minerals tablet 1 tablet  1 tablet Oral Daily Kc, Dayna Barker, MD   1 tablet at 11/11/20 1038   nystatin (MYCOSTATIN) 100000 UNIT/ML suspension 500,000 Units  5 mL Oral QID Lewayne Bunting, MD   500,000 Units at 11/11/20 1442   ondansetron (ZOFRAN) tablet 4 mg  4 mg Oral Q6H PRN Lewayne Bunting, MD       Or   ondansetron [facility]) injection 4 mg  4 mg Intravenous Q6H PRN Lewayne Bunting, MD   4 mg at 10/29/20 1845   pantoprazole (PROTONIX) EC tablet 40 mg  40 mg Oral Daily Lewayne Bunting, MD   40 mg at 11/11/20 1038   potassium chloride SA (KLOR-CON) CR tablet 40 mEq  40 mEq Oral Daily Barrett, Rhonda G, PA-C   40 mEq at 11/11/20 1037   predniSONE (DELTASONE) tablet 4 mg  4 mg Oral Daily Kc, Dayna Barker, MD   4 mg at 11/11/20 1039     Discharge Medications: Please see discharge summary for a list of discharge  medications.  Relevant Imaging Results:  Relevant Lab Results:   Additional Information SSN [id number]  Eduard Roux, LCSW

## 2020-11-11 NOTE — Discharge Summary (Addendum)
Physician Discharge Summary  Andrea Yu:[id number] DOB: [birth year] DOA: 10/29/2020  PCP: Jarome Matin, MD  Admit date: 10/29/2020 Discharge date: 11/14/2020  Admitted From: home Disposition:  HH  Recommendations for Outpatient Follow-up:  Follow up with PCP in 1-2 weeks Please obtain BMP/CBC in one week Please follow up on the following pending results:  Home Health:YES  Equipment/Devices: YES  Discharge Condition: Stable Code Status:   Code Status: DNR Diet recommendation:  Diet Order             DIET DYS 3 Room service appropriate? No; Fluid consistency: Thin  Diet effective now           Diet - low sodium heart healthy                    Brief/Interim Summary: Andrea Yu, 80 y.o. female with PMH of A. fib not on anticoagulation, GERD, neuropathy, morbid obesity, psoriatic arthritis, iron deficiency anemia history of subarachnoid hemorrhage, HTN presented to the ED with urinary frequency, decreased oral intake, after recently completed Bactrim course for dysuria. She was seen in the ED found to have leukocytosis tachycardia tachypnea fever hypotension and diagnosed with UTI, CT abdomen pelvis showed fluid collection inferior portion of the right buttock region, inferior to the right ischial tuberosity, blood cultures were obtained and admitted on IV antibiotics.  ID was consulted.  Blood culture 10/5 with positive for Streptococcus para sanguinous 1 out of 4 bottles, felt contaminant. Hospital course was complicated by recurrent fever without clear source and also had episode of altered mental status 10/13 code stroke activated, patient returned to baseline.  She had further work-up with TEE 10/14 for her fever and endocarditis was ruled out.  CT chest abdomen pelvis 10/14 unremarkable for any acute finding, seen by IR and no indication for aspiration of the fluid.  Vancomycin was discontinued 10/15 after negative CT abdomen Her mental status back to baseline and  has no recurrence of fever.  She remains weak and deconditioned PT assessed a skilled nursing facility awaiting for family decision-patient has declined SNF, daughter understands and is okay with discharge home with home health. she has a caregiver who comes from 10-3 every day. PTOT reeal done- advised snf but pt is adamant on going home with daughter. Palliative also saw her- she opted for DNR and if declines in future she is aware abt hospice  Discharge Diagnoses:  Intermittent fever with unclear cause UTI: Sepsis suspected on admission and was managed IV antibiotics.  She likely had a UTI.  Completed antibiotics.urine culture multiple species, blood culture with contamination otherwise repeat repeat blood culture 10/11 negative.Unclear source of the recurrent fever -TEE negative for endocarditis, interarterial shunt present-but vascular duplex negative for DVT/not a candidate for anticoagulation. Patient was on ceftriaxone changed to IV vancomycin due to concern for drug fever. And Off vancomycin 10/15 as repeat CT abdomen pelvis 10/14 showed similar size of fluid collection in the right buttock-no indication for drainage as per IR.  At this time afebrile no leukocytosis ID signed off.    Streptococcus parasanguinis 1/4 bottle likely a contaminant.  Acute metabolic encephalopathy multifactorial CT head 10/10 chronic small vessel disease she remains deconditioned and weak and frail. Seen by neurology.she is back to baseline alert awake oriented x3.   Refractory hypokalemia: Resolved.  Continue 40 of Kdur daily. Recent Labs  Lab 11/08/20 0454 11/09/20 0152 11/10/20 0601  K 3.2* 4.0 3.8    Mild hypovolemic hyponatremia-stable. [place] metabolic  acidosis bicarb improved  Chronic diastolic CHF: Euvolemic. Weight has improved since admission.  Seen by cardiology.Net IO Since Admission: 3,881.07 mL [11/14/20 1318]  Filed Weights   10/30/20 0700 11/04/20 0327 11/08/20 0311  Weight: 83 kg 86.9  kg 74.8 kg   Chronic anxiety/depression: Continue home Cymbalta.  Mood is stable.  GERD on PPI  Anemia likely from chronic disease: Hemoglobin is stable cont outpatient follow-up with PCP and as needed GI follow-up Recent Labs  Lab 11/08/20 0454 11/10/20 0601  HGB 9.5* 9.4*  HCT 29.7* 30.2*    Generalized deconditioning/physical debility: Multifactorial with comorbidities.  Continue PT OT, skilled nursing facility has been recommended TOC working on it-if patient declines SNF she will be discharged home with home health.  Adrenal insufficiency history it appears she was on prednisone-continue.  PAF not on anticoagulation.  Rate well controlled at this time on  increased dose of metoprolol .Off Cardizem gtt.  History of SAH/anemia/fall-not a candidate for anticoagulation per Cariology- signed off 10/17  Psoriatic arthritis -Tylenol, diclofenac gel Peripheral neuropathy-supportive care , home Neurontin Hx of SAH  Essential hypertension: Controlled, continue metoprolol.  Holding ?home losartan  GERD:on ppi  Class II Obesity:Patient's Body mass index is 33.31 kg/m. : Will benefit with PCP follow-up, weight loss  healthy lifestyle and outpatient sleep evaluation.  Pressure injury right buttock healed. Pressure Injury 10/30/20 Buttocks Right healed (Active)  10/30/20 2000  Location: Buttocks  Location Orientation: Right  Staging:   Wound Description (Comments): healed  Present on Admission: Yes   Consults: Cardiology next infectious disease  Subjective: Alert awake oriented this morning.  reluctant to go to skilled nursing facility, waiting for family decision.  Discharge Exam: Vitals:   11/14/20 0805 11/14/20 1205  BP: (!) 142/80 (!) 150/74  Pulse: 99 88  Resp: 18 18  Temp: 97.7 F (36.5 C) 98 F (36.7 C)  SpO2: 100% 98%   General: Pt is alert, awake, not in acute distress Cardiovascular: RRR, S1/S2 +, no rubs, no gallops Respiratory: CTA bilaterally, no  wheezing, no rhonchi Abdominal: Soft, NT, ND, bowel sounds + Extremities: no edema, no cyanosis  Discharge Instructions  Discharge Instructions     (HEART FAILURE PATIENTS) Call MD:  Anytime you have any of the following symptoms: 1) 3 pound weight gain in 24 hours or 5 pounds in 1 week 2) shortness of breath, with or without a dry hacking cough 3) swelling in the hands, feet or stomach 4) if you have to sleep on extra pillows at night in order to breathe.   Complete by: As directed    Diet - low sodium heart healthy   Complete by: As directed    Discharge wound care:   Complete by: As directed    Regular dressing at home for skin tear   Increase activity slowly   Complete by: As directed       Allergies as of 11/14/2020       Reactions   Penicillins Anaphylaxis   THROAT SWELLING Has patient had a PCN reaction causing immediate rash, facial/tongue/throat swelling, SOB or lightheadedness with hypotension:  # # YES # #  Has patient had a PCN reaction causing severe rash involving mucus membranes or skin necrosis: # # # YES # # # Has patient had a PCN reaction that required hospitalization:No Has patient had a PCN reaction occurring within the last 10 years:  # # YES # #  If all of the above answers are "NO", then may proceed with  Cephalosporin use.   Prolia [denosumab] Other (See Comments)   Severe Pain in the groin area.   Ceftriaxone    Possible cause of neutropenia   Ciprofloxacin Hcl Other (See Comments)   Fosamax [alendronate Sodium]    UNSPECIFIED REACTION    Latex Swelling   Lisinopril Cough   Tubersol [tuberculin Ppd] Other (See Comments)   Unknown        Medication List     STOP taking these medications    fluconazole 150 MG tablet Commonly known as: DIFLUCAN   furosemide 40 MG tablet Commonly known as: LASIX   gabapentin 600 MG tablet Commonly known as: NEURONTIN   losartan 100 MG tablet Commonly known as: COZAAR       TAKE these medications     acetaminophen 325 MG tablet Commonly known as: TYLENOL Take 2 tablets (650 mg total) by mouth every 4 (four) hours as needed. What changed:  how much to take when to take this reasons to take this   albuterol 108 (90 Base) MCG/ACT inhaler Commonly known as: VENTOLIN HFA Inhale 2 puffs into the lungs every 4 (four) hours as needed for wheezing or shortness of breath.   aspirin EC 81 MG tablet Take 81 mg by mouth every evening.   Biotin 5000 MCG Caps Take 5,000 mcg by mouth every evening.   capsaicin 0.025 % cream Commonly known as: ZOSTRIX Apply 1 application topically 2 (two) times daily as needed (painful areas).   Cartia XT 120 MG 24 hr capsule Generic drug: diltiazem TAKE 1 CAPSULE BY MOUTH  DAILY What changed: how much to take   CoQ10 100 MG Caps Take 100 mg by mouth daily.   diclofenac Sodium 1 % Gel Commonly known as: VOLTAREN Apply 4 g topically 2 (two) times daily as needed (pain).   DULoxetine 30 MG capsule Commonly known as: CYMBALTA Take 90 mg by mouth daily. Take with 60mg  for a total daily dose of 90mg    DULoxetine 60 MG capsule Commonly known as: CYMBALTA Take 90 mg by mouth daily. Take with 30mg  for a total daily dose of 90mg    feeding supplement Liqd Take 237 mLs by mouth 2 (two) times daily between meals.   Fish Oil 1000 MG Caps Take 1,000 mg by mouth daily.   FLAX SEED OIL PO Take 1 capsule by mouth daily.   HYDROcodone-acetaminophen 5-325 MG tablet Commonly known as: NORCO/VICODIN Take 3 tablets by mouth in the morning, at noon, and at bedtime.   hydroxypropyl methylcellulose / hypromellose 2.5 % ophthalmic solution Commonly known as: ISOPTO TEARS / GONIOVISC Place 1 drop into both eyes 3 (three) times daily as needed for dry eyes.   Lidocaine HCl 4 % Crea Apply 1 application topically 2 (two) times daily as needed (pain). Also uses roll on lidocaine 4%   Magnesium 250 MG Tabs Take 250 mg by mouth at bedtime.   methocarbamol 500  MG tablet Commonly known as: ROBAXIN Take 500 mg by mouth in the morning, at noon, and at bedtime.   metoprolol succinate 50 MG 24 hr tablet Commonly known as: TOPROL-XL Take 50 mg by mouth daily. Take with or immediately following a meal. What changed: Another medication with the same name was added. Make sure you understand how and when to take each.   metoprolol succinate 25 MG 24 hr tablet Commonly known as: TOPROL-XL Take 1 tablet (25 mg total) by mouth daily for 30 doses. For total of 75 mg What changed: You were  already taking a medication with the same name, and this prescription was added. Make sure you understand how and when to take each.   multivitamin with minerals Tabs tablet Take 1 tablet by mouth daily.   nystatin 100000 UNIT/ML suspension Commonly known as: MYCOSTATIN Take 5 mLs (500,000 Units total) by mouth 4 (four) times daily for 7 days.   nystatin cream Commonly known as: MYCOSTATIN Apply 1 application topically daily as needed (yeast).   omeprazole 20 MG capsule Commonly known as: PRILOSEC Take 20 mg by mouth every evening.   Otezla 30 MG Tabs Generic drug: Apremilast Take 30 mg by mouth 2 (two) times daily.   potassium chloride SA 20 MEQ tablet Commonly known as: KLOR-CON Take 20 mEq by mouth 2 (two) times daily.   predniSONE 1 MG tablet Commonly known as: DELTASONE Take 4 mg by mouth daily.   senna-docusate 8.6-50 MG tablet Commonly known as: Senokot-S Take 1 tablet by mouth at bedtime as needed for mild constipation.   Vitamin D (Ergocalciferol) 1.25 MG (50000 UNIT) Caps capsule Commonly known as: DRISDOL Take 50,000 Units by mouth every Tuesday.               Discharge Care Instructions  (From admission, onward)           Start     Ordered   11/11/20 0000  Discharge wound care:       Comments: Regular dressing at home for skin tear   11/11/20 1022            Follow-up Information     Jarome Matin, MD Follow up  in 1 week(s).   Specialty: Internal Medicine Contact information: [address] [place] [address] [phone]         Thurmon Fair, MD .   Specialty: Cardiology Contact information: [address] [place] [address] [phone]         [facility] Follow up.   Specialty: Home Health Services Why: ([facility])-HHPT/OT - arranged- they will contact you to schedule home visits Contact information: [address] [place] [address] [phone]                Allergies  Allergen Reactions   Penicillins Anaphylaxis    THROAT SWELLING  Has patient had a PCN reaction causing immediate rash, facial/tongue/throat swelling, SOB or lightheadedness with hypotension:  # # YES # #  Has patient had a PCN reaction causing severe rash involving mucus membranes or skin necrosis: # # # YES # # # Has patient had a PCN reaction that required hospitalization:No Has patient had a PCN reaction occurring within the last 10 years:  # # YES # #  If all of the above answers are "NO", then may proceed with Cephalosporin use.    Prolia [Denosumab] Other (See Comments)    Severe Pain in the groin area.   Ceftriaxone     Possible cause of neutropenia   Ciprofloxacin Hcl Other (See Comments)   Fosamax [Alendronate Sodium]     UNSPECIFIED REACTION    Latex Swelling   Lisinopril Cough   Tubersol [Tuberculin Ppd] Other (See Comments)    Unknown     The results of significant diagnostics from this hospitalization (including imaging, microbiology, ancillary and laboratory) are listed below for reference.    Microbiology: No results found for this or any previous visit (from the past 240 hour(s)).   Procedures/Studies: DG Chest 1 View  Result Date: 11/02/2020 CLINICAL  DATA:  Fever, confusion EXAM: CHEST  1 VIEW COMPARISON:  10/29/2020 FINDINGS: Single frontal view of the chest was obtained with the patient rotated to the left. The cardiac  silhouette is unremarkable. No acute airspace disease, effusion, or pneumothorax. No acute bony abnormalities. IMPRESSION: 1. No acute intrathoracic process. Electronically Signed   By: Sharlet Salina M.D.   On: 11/02/2020 18:50   CT HEAD WO CONTRAST ( )  Result Date: 11/03/2020 CLINICAL DATA:  Delirium EXAM: CT HEAD WITHOUT CONTRAST TECHNIQUE: Contiguous axial images were obtained from the base of the skull through the vertex without intravenous contrast. COMPARISON:  None. FINDINGS: Brain: There is no mass, hemorrhage or extra-axial collection. The size and configuration of the ventricles and extra-axial CSF spaces are normal. There is hypoattenuation of the white matter, most commonly indicating chronic small vessel disease. Vascular: No abnormal hyperdensity of the major intracranial arteries or dural venous sinuses. No intracranial atherosclerosis. Skull: The visualized skull base, calvarium and extracranial soft tissues are normal. Sinuses/Orbits: No fluid levels or advanced mucosal thickening of the visualized paranasal sinuses. No mastoid or middle ear effusion. The orbits are normal. IMPRESSION: Chronic small vessel disease without acute intracranial abnormality. Electronically Signed   By: Deatra Robinson M.D.   On: 11/03/2020 00:36   CT HEAD WO CONTRAST ( )  Result Date: 10/31/2020 CLINICAL DATA:  Delirium EXAM: CT HEAD WITHOUT CONTRAST TECHNIQUE: Contiguous axial images were obtained from the base of the skull through the vertex without intravenous contrast. COMPARISON:  06/07/2017 FINDINGS: Brain: Normal anatomic configuration. Parenchymal volume loss is commensurate with the patient's age. Mild periventricular white matter changes are present likely reflecting the sequela of small vessel ischemia. 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 No abnormal intra or extra-axial mass lesion or fluid collection. No abnormal mass effect or midline shift. No evidence  of acute intracranial hemorrhage or infarct. Ventricular size is normal. Cerebellum unremarkable. Vascular: No asymmetric hyperdense vasculature at the skull base. Advanced atherosclerotic calcification is noted within the carotid siphons. Skull: Intact Sinuses/Orbits: Paranasal sinuses are clear. Orbits are unremarkable. Other: Mastoid air cells and middle ear cavities are clear. IMPRESSION: Stable senescent change. No acute intracranial hemorrhage or infarct. Electronically Signed   By: Helyn Numbers M.D.   On: 10/31/2020 22:01   CT ABDOMEN PELVIS W CONTRAST  Result Date: 10/29/2020 CLINICAL DATA:  Abdominal abscess or infection. EXAM: CT ABDOMEN AND PELVIS WITH CONTRAST TECHNIQUE: Multidetector CT imaging of the abdomen and pelvis was performed using the standard protocol following bolus administration of intravenous contrast. CONTRAST:  80mL OMNIPAQUE IOHEXOL 300 MG/ML  SOLN COMPARISON:  Jun 10, 2017. FINDINGS: Lower chest: No acute abnormality. Hepatobiliary: No focal liver abnormality is seen. Status post cholecystectomy. No biliary dilatation. Pancreas: Unremarkable. No pancreatic ductal dilatation or surrounding inflammatory changes. Spleen: Normal in size without focal abnormality. Adrenals/Urinary Tract: Adrenal glands appear normal. Right renal cyst is noted. No hydronephrosis or renal obstruction is noted. Urinary bladder is unremarkable. Stomach/Bowel: Stomach is within normal limits. Appendix appears normal. No evidence of bowel wall thickening, distention, or inflammatory changes. Vascular/Lymphatic: Aortic atherosclerosis. No enlarged abdominal or pelvic lymph nodes. Reproductive: Status post hysterectomy. No adnexal masses. Other: 2 moderate size fat containing periumbilical hernias are noted. No ascites is noted. 6.1 x 3.3 cm fluid collection is seen in the inferior portion of the right buttocks region inferior to right ischial tuberosity. Musculoskeletal: No acute or significant osseous  findings. IMPRESSION: 6.1 x 3.3 cm fluid collection is seen in  the inferior portion of the right buttocks region inferior to right ischial tuberosity. This was present on prior exam. Potentially this may represent abscess. Two moderate size fat containing periumbilical hernias are noted. Aortic Atherosclerosis (ICD10-I70.0). Electronically Signed   By: Lupita Raider M.D.   On: 10/29/2020 16:40   US RENAL  Result Date: 11/04/2020 CLINICAL DATA:  Hypertension, pyelonephritis. EXAM: RENAL / URINARY TRACT ULTRASOUND COMPLETE COMPARISON:  CT scan 10/29/2020 FINDINGS: Right Kidney: Renal measurements: 13.0 by 4.7 by 5.9 cm = volume: 167 mL. Echogenicity within normal limits. No solid mass or hydronephrosis visualized. Indistinct posterior 1.4 by 0.8 cm exophytic lesion from the right kidney upper pole posteriorly corresponding to the small cyst seen on the CT of 10/29/2020. Left Kidney: Renal measurements: 12.4 by 5.1 by 5.0 cm = volume: 164 mL. Echogenicity within normal limits. No mass or hydronephrosis visualized. 0.8 cm cyst of the upper pole on image 22 series 1-1. Bladder: Appears normal for degree of bladder distention. Other: None. IMPRESSION: 1. Single tiny bilateral upper pole cysts. 2. No evidence of hydronephrosis or hydroureter. No additional significant renal parenchymal findings. Please note that pyelonephritis can be occult on sonography. No renal abscess is identified. Electronically Signed   By: Gaylyn Rong M.D.   On: 11/04/2020 14:06   CT CHEST ABDOMEN PELVIS W CONTRAST  Result Date: 11/07/2020 CLINICAL DATA:  Fever unknown of unknown origin EXAM: CT CHEST, ABDOMEN, AND PELVIS WITH CONTRAST TECHNIQUE: Multidetector CT imaging of the chest, abdomen and pelvis was performed following the standard protocol during bolus administration of intravenous contrast. CONTRAST:  OMNIPAQUE IOHEXOL 350 MG/ML SOLN COMPARISON:  CT October 29, 2020. FINDINGS: CT CHEST FINDINGS Cardiovascular:  Aortic and branch vessel atherosclerosis without thoracic aortic aneurysm. No central pulmonary embolus on this nondedicated study. Coronary artery calcifications. Normal size heart. No significant pericardial effusion/thickening. Mediastinum/Nodes: No suspicious thyroid nodule. No mediastinal, hilar or axillary adenopathy. The trachea esophagus are unremarkable. Lungs/Pleura: Tiny left greater than right pleural effusions. Hypoventilatory change in the dependent lungs. No pneumothorax. Musculoskeletal: Multilevel degenerative changes spine. No acute osseous abnormality. CT ABDOMEN PELVIS FINDINGS Hepatobiliary: No suspicious hepatic lesion. Gallbladder surgically absent. No biliary ductal dilation. Pancreas: Within normal limits. Spleen: Within normal limits. Adrenals/Urinary Tract: Bilateral adrenal glands are unremarkable. No hydronephrosis. No renal, ureteral or bladder calculus visualized. Right renal cyst. Similar mild right greater than left perinephric stranding. Urinary bladder is unremarkable for degree of distension. Stomach/Bowel: Stomach is unremarkable. No pathologic dilation of small or large bowel. Gas fluid levels in the colon suggestive of diarrheal illness. No evidence of acute bowel inflammation. Vascular/Lymphatic: Extensive aortic and branch vessel atherosclerosis without aortic aneurysm. No pathologically enlarged abdominal or pelvic lymph nodes. Reproductive: Status post hysterectomy. No adnexal masses. Other: Trace pelvic free fluid. Two moderate-sized fat containing periumbilical hernias. Similar size of the fluid collection seen in the inferior portion the right buttocks inferior to the right ischial tuberosity measuring 6.3 x 2.8 cm previously 6.1 x 3.3 cm. Sequela of subcutaneous injections in the anterior abdominal wall. Musculoskeletal: Multilevel degenerative changes spine. L3-L5 posterior spinal fusion hardware. No acute osseous abnormality. IMPRESSION: 1. Gas fluid levels in the  colon suggestive of diarrheal illness. No evidence of acute bowel inflammation or obstruction. 2. Tiny left greater than right pleural effusions. 3. Similar size of the fluid collection in the inferior portion of the right buttocks inferior to the right ischial tuberosity measuring 6.3 x 2.8 cm which again the has been present on prior examinations and  may reflect an abscess correlation with direct visualization suggested. 4. Two moderate fat containing periumbilical hernias. 5. Aortic Atherosclerosis (ICD10-I70.0). Electronically Signed   By: Maudry Mayhew M.D.   On: 11/07/2020 18:39   DG Chest Port 1 View  Result Date: 10/29/2020 CLINICAL DATA:  Possible sepsis, dehydration. EXAM: PORTABLE CHEST 1 VIEW COMPARISON:  Jun 06, 2017. FINDINGS: The heart size and mediastinal contours are within normal limits. Both lungs are clear. The visualized skeletal structures are unremarkable. IMPRESSION: No active disease. Electronically Signed   By: Lupita Raider M.D.   On: 10/29/2020 15:10   ECHOCARDIOGRAM COMPLETE  Result Date: 11/01/2020    ECHOCARDIOGRAM REPORT   Patient Name:   Andrea Yu Date of Exam: 11/01/2020 Medical Rec #:  [id number]     Height:       59.0 in Accession #:    [id number]    Weight:       183.0 lb Date of Birth:  [birth year]    BSA:          1.776 m Patient Age:    79 years      BP:           127/62 mmHg Patient Gender: F             HR:           81 bpm. Exam Location:  Inpatient Procedure: 2D Echo, Cardiac Doppler and Color Doppler Indications:    Bacteremia  History:        Patient has prior history of Echocardiogram examinations, most                 recent 04/19/2017. Arrythmias:Atrial Fibrillation; Risk                 Factors:Hypertension.  Sonographer:    Eulah Pont RDCS Referring Phys: Encarnacion Chu COMER IMPRESSIONS  1. Left ventricular ejection fraction, by estimation, is 60 to 65%. The left ventricle has normal function. The left ventricle has no regional wall motion  abnormalities. Left ventricular diastolic function could not be evaluated.  2. Right ventricular systolic function is normal. The right ventricular size is normal. There is mildly elevated pulmonary artery systolic pressure.  3. Left atrial size was mildly dilated.  4. The mitral valve is normal in structure. Trivial mitral valve regurgitation.  5. The aortic valve is normal in structure. Aortic valve regurgitation is mild. Mild aortic valve sclerosis is present, with no evidence of aortic valve stenosis.  6. The inferior vena cava is dilated in size with >50% respiratory variability, suggesting right atrial pressure of 8 mmHg. Conclusion(s)/Recommendation(s): No evidence of valvular vegetations on this transthoracic echocardiogram. Would recommend a transesophageal echocardiogram to exclude infective endocarditis if clinically indicated. FINDINGS  Left Ventricle: Left ventricular ejection fraction, by estimation, is 60 to 65%. The left ventricle has normal function. The left ventricle has no regional wall motion abnormalities. The left ventricular internal cavity size was normal in size. There is  no left ventricular hypertrophy. Left ventricular diastolic function could not be evaluated due to atrial fibrillation. Left ventricular diastolic function could not be evaluated. Right Ventricle: The right ventricular size is normal. No increase in right ventricular wall thickness. Right ventricular systolic function is normal. There is mildly elevated pulmonary artery systolic pressure. The tricuspid regurgitant velocity is 2.73  m/s, and with an assumed right atrial pressure of 8 mmHg, the estimated right ventricular systolic pressure is 37.8 mmHg. Left Atrium: Left atrial size was  mildly dilated. Right Atrium: Right atrial size was normal in size. Pericardium: There is no evidence of pericardial effusion. Mitral Valve: The mitral valve is normal in structure. Mild mitral annular calcification. Trivial mitral valve  regurgitation. Tricuspid Valve: The tricuspid valve is normal in structure. Tricuspid valve regurgitation is mild. Aortic Valve: The aortic valve is normal in structure. Aortic valve regurgitation is mild. Aortic regurgitation PHT measures 748 msec. Mild aortic valve sclerosis is present, with no evidence of aortic valve stenosis. Pulmonic Valve: The pulmonic valve was normal in structure. Pulmonic valve regurgitation is not visualized. Aorta: The aortic root and ascending aorta are structurally normal, with no evidence of dilitation. Venous: The inferior vena cava is dilated in size with greater than 50% respiratory variability, suggesting right atrial pressure of 8 mmHg. IAS/Shunts: No atrial level shunt detected by color flow Doppler.  LEFT VENTRICLE PLAX 2D LVIDd:         3.70 cm LVIDs:         2.40 cm LV PW:         1.10 cm LV IVS:        1.00 cm LVOT diam:     1.80 cm LV SV:         48 LV SV Index:   27 LVOT Area:     2.54 cm  RIGHT VENTRICLE TAPSE (M-mode): 1.9 cm LEFT ATRIUM           Index        RIGHT ATRIUM           Index LA diam:      4.20 cm 2.36 cm/m   RA Area:     13.00 cm LA Vol (A2C): 51.6 ml 29.05 ml/m  RA Volume:   31.20 ml  17.57 ml/m LA Vol (A4C): 88.0 ml 49.56 ml/m  AORTIC VALVE LVOT Vmax:   113.00 cm/s LVOT Vmean:  73.300 cm/s LVOT VTI:    0.190 m AI PHT:      748 msec  AORTA Ao Asc diam: 3.10 cm TRICUSPID VALVE TR Peak grad:   29.8 mmHg TR Vmax:        273.00 cm/s  SHUNTS Systemic VTI:  0.19 m Systemic Diam: 1.80 cm Rachelle Hora Croitoru MD Electronically signed by Thurmon Fair MD Signature Date/Time: 11/01/2020/1:18:45 PM    Final    ECHO TEE  Result Date: 11/07/2020    TRANSESOPHOGEAL ECHO REPORT   Patient Name:   Andrea Yu Date of Exam: 11/07/2020 Medical Rec #:  [id number]     Height:       59.0 in Accession #:    [id number]    Weight:       191.6 lb Date of Birth:  03-13-1940    BSA:          1.811 m Patient Age:    79 years      BP:           116/44 mmHg Patient Gender: F              HR:           83 bpm. Exam Location:  Inpatient Procedure: Transesophageal Echo, Cardiac Doppler, Color Doppler and Saline            Contrast Bubble Study Indications:     Bacteremia  History:         Patient has prior history of Echocardiogram examinations, most  recent 11/01/2020. Arrythmias:Atrial Fibrillation; Risk                  Factors:Hypertension. GERD.  Sonographer:     Ross Ludwig RDCS (AE) Referring Phys:  [id number] Madolyn Frieze CRENSHAW Diagnosing Phys: Olga Millers MD PROCEDURE: After discussion of the risks and benefits of a TEE, an informed consent was obtained from the patient. The transesophogeal probe was passed without difficulty through the esophogus of the patient. Sedation performed by different physician. The patient was monitored while under deep sedation. Anesthestetic sedation was provided intravenously by Anesthesiology: 220mg  of Propofol. Image quality was good. The patient developed no complications during the procedure. IMPRESSIONS  1. No vegetations noted.  2. Left ventricular ejection fraction, by estimation, is 55 to 60%. The left ventricle has normal function. The left ventricle has no regional wall motion abnormalities.  3. Right ventricular systolic function is normal. The right ventricular size is normal.  4. Left atrial size was moderately dilated. No left atrial/left atrial appendage thrombus was detected.  5. The mitral valve is normal in structure. Trivial mitral valve regurgitation.  6. The aortic valve is tricuspid. Aortic valve regurgitation is mild.  7. 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  8. Evidence of atrial level shunting detected by color flow Doppler. Agitated saline contrast bubble study was positive with shunting observed within 3-6 cardiac cycles suggestive of interatrial shunt. There is a small patent foramen ovale. FINDINGS  Left Ventricle: Left ventricular ejection fraction, by estimation, is 55 to 60%. The left  ventricle has normal function. The left ventricle has no regional wall motion abnormalities. The left ventricular internal cavity size was normal in size. Right Ventricle: The right ventricular size is normal. Right ventricular systolic function is normal. Left Atrium: Left atrial size was moderately dilated. No left atrial/left atrial appendage thrombus was detected. Right Atrium: Right atrial size was normal in size. Pericardium: There is no evidence of pericardial effusion. Mitral Valve: The mitral valve is normal in structure. Trivial mitral valve regurgitation. Tricuspid Valve: The tricuspid valve is normal in structure. Tricuspid valve regurgitation is trivial. Aortic Valve: The aortic valve is tricuspid. Aortic valve regurgitation is mild. Pulmonic Valve: The pulmonic valve was normal in structure. Pulmonic valve regurgitation is trivial. Aorta: The aortic root is normal in size and structure. There is moderate (Grade III) plaque involving the descending aorta. IAS/Shunts: Evidence of atrial level shunting detected by color flow Doppler. Agitated saline contrast was given intravenously to evaluate for intracardiac shunting. Agitated saline contrast bubble study was positive with shunting observed within 3-6 cardiac cycles suggestive of interatrial shunt. A small patent foramen ovale is detected. Additional Comments: No vegetations noted.   AORTA Ao Root diam: 2.80 cm Ao Asc diam:  2.80 cm TRICUSPID VALVE TR Peak grad:   16.2 mmHg TR Vmax:        201.00 cm/s Olga Millers MD Electronically signed by Olga Millers MD Signature Date/Time: 11/07/2020/1:03:44 PM    Final    CT HEAD CODE STROKE WO CONTRAST  Result Date: 11/06/2020 CLINICAL DATA:  Code stroke. Acute neuro deficit. Not following commands EXAM: CT HEAD WITHOUT CONTRAST TECHNIQUE: Contiguous axial images were obtained from the base of the skull through the vertex without intravenous contrast. COMPARISON:  CT head 11/03/2020 FINDINGS: Brain: Mild  atrophy. Patchy white matter hypodensity bilaterally appears chronic and unchanged from the prior study. Negative for acute infarct, hemorrhage, mass. Vascular: Negative for hyperdense vessel Skull: Negative Sinuses/Orbits: Paranasal sinuses clear.  No orbital lesion. Other: None ASPECTS ([place] Stroke Program Early CT Score) - Ganglionic level infarction (caudate, lentiform nuclei, internal capsule, insula, M1-M3 cortex): 7 - Supraganglionic infarction (M4-M6 cortex): 3 Total score (0-10 with 10 being normal): 10 IMPRESSION: 1. No acute intracranial abnormality and no change from 3 days prior. 2. ASPECTS is 10 3. Code stroke imaging results were communicated on 11/06/2020 at 11:02 am to provider [facility] via text page Electronically Signed   By: Marlan Palau M.D.   On: 11/06/2020 11:04   VAS [place] LOWER EXTREMITY VENOUS (DVT)  Result Date: 11/05/2020  Lower Venous DVT Study Patient Name:  Andrea Yu  Date of Exam:   11/05/2020 Medical Rec #: [id number]      Accession #:    [id number] Date of Birth: 13-Sep-1940     Patient Gender: F Patient Age:   50 years Exam Location:  [facility] Procedure:      VAS [place] LOWER EXTREMITY VENOUS (DVT) Referring Phys: Odette Fraction --------------------------------------------------------------------------------  Indications: Bacteremia.  Limitations: Patient movement and positioning. Comparison Study: no prior Performing Technologist: [place] Ponder RVS  Examination Guidelines: A complete evaluation includes B-mode imaging, spectral Doppler, color Doppler, and power Doppler as needed of all accessible portions of each vessel. Bilateral testing is considered an integral part of a complete examination. Limited examinations for reoccurring indications may be performed as noted. The reflux portion of the exam is performed with the patient in reverse Trendelenburg.  +---------+---------------+---------+-----------+----------+-------------------+ RIGHT     CompressibilityPhasicitySpontaneityPropertiesThrombus Aging      +---------+---------------+---------+-----------+----------+-------------------+ CFV      Full           Yes      Yes                                      +---------+---------------+---------+-----------+----------+-------------------+ SFJ      Full                                                             +---------+---------------+---------+-----------+----------+-------------------+ FV Prox  Full                                                             +---------+---------------+---------+-----------+----------+-------------------+ FV Mid   Full                                                             +---------+---------------+---------+-----------+----------+-------------------+ FV DistalFull                                                             +---------+---------------+---------+-----------+----------+-------------------+ PFV      Full                                                             +---------+---------------+---------+-----------+----------+-------------------+  POP      Full           Yes      Yes                                      +---------+---------------+---------+-----------+----------+-------------------+ PTV      Full                                                             +---------+---------------+---------+-----------+----------+-------------------+ PERO                                                  Not well visualized +---------+---------------+---------+-----------+----------+-------------------+   +---------+---------------+---------+-----------+----------+-------------------+ LEFT     CompressibilityPhasicitySpontaneityPropertiesThrombus Aging      +---------+---------------+---------+-----------+----------+-------------------+ CFV      Full           Yes      Yes                                       +---------+---------------+---------+-----------+----------+-------------------+ SFJ      Full                                                             +---------+---------------+---------+-----------+----------+-------------------+ FV Prox  Full                                                             +---------+---------------+---------+-----------+----------+-------------------+ FV Mid   Full                                                             +---------+---------------+---------+-----------+----------+-------------------+ FV DistalFull                                                             +---------+---------------+---------+-----------+----------+-------------------+ PFV      Full                                                             +---------+---------------+---------+-----------+----------+-------------------+ POP      Full  Yes      Yes                                      +---------+---------------+---------+-----------+----------+-------------------+ PTV                                                   Not well visualized +---------+---------------+---------+-----------+----------+-------------------+ PERO                                                  Not well visualized +---------+---------------+---------+-----------+----------+-------------------+     Summary: RIGHT: - There is no evidence of deep vein thrombosis in the lower extremity.  - No cystic structure found in the popliteal fossa.  LEFT: - There is no evidence of deep vein thrombosis in the lower extremity. However, portions of this examination were limited- see technologist comments above.  - No cystic structure found in the popliteal fossa.  *See table(s) above for measurements and observations. Electronically signed by Coral Else MD on 11/05/2020 at 9:28:13 PM.    Final     Labs: BNP (last 3 results) Recent Labs    11/06/20 0451  BNP  471.6*   Basic Metabolic Panel: Recent Labs  Lab 11/08/20 0454 11/09/20 0152 11/10/20 0601 11/12/20 0520  NA 133* 133* 132*  --   K 3.2* 4.0 3.8  --   CL 108 108 104  --   CO2 16* 18* 22  --   GLUCOSE 83 88 105*  --   BUN 9 12 12   --   CREATININE 0.93 0.92 0.93 0.87  CALCIUM 8.0* 8.3* 8.6*  --   MG  --  1.9  --   --    Liver Function Tests: No results for input(s): AST, ALT, ALKPHOS, BILITOT, PROT, ALBUMIN in the last 168 hours.  No results for input(s): LIPASE, AMYLASE in the last 168 hours. No results for input(s): AMMONIA in the last 168 hours. CBC: Recent Labs  Lab 11/08/20 0454 11/10/20 0601  WBC 7.8 3.3*  HGB 9.5* 9.4*  HCT 29.7* 30.2*  MCV 85.6 87.0  PLT 312 336   Cardiac Enzymes: No results for input(s): CKTOTAL, CKMB, CKMBINDEX, TROPONINI in the last 168 hours. BNP: Invalid input(s): POCBNP CBG: No results for input(s): GLUCAP in the last 168 hours.  D-Dimer No results for input(s): DDIMER in the last 72 hours. Hgb A1c No results for input(s): HGBA1C in the last 72 hours. Lipid Profile No results for input(s): CHOL, HDL, LDLCALC, TRIG, CHOLHDL, LDLDIRECT in the last 72 hours. Thyroid function studies No results for input(s): TSH, T4TOTAL, T3FREE, THYROIDAB in the last 72 hours.  Invalid input(s): FREET3 Anemia work up No results for input(s): VITAMINB12, FOLATE, FERRITIN, TIBC, IRON, RETICCTPCT in the last 72 hours. Urinalysis    Component Value Date/Time   COLORURINE YELLOW 11/04/2020 0550   APPEARANCEUR HAZY (A) 11/04/2020 0550   LABSPEC 1.013 11/04/2020 0550   PHURINE 6.0 11/04/2020 0550   GLUCOSEU NEGATIVE 11/04/2020 0550   HGBUR MODERATE (A) 11/04/2020 0550   BILIRUBINUR NEGATIVE 11/04/2020 0550   KETONESUR NEGATIVE 11/04/2020 0550   PROTEINUR 30 (A) 11/04/2020 0550  UROBILINOGEN 0.2 09/14/2014 1637   NITRITE NEGATIVE 11/04/2020 0550   LEUKOCYTESUR LARGE (A) 11/04/2020 0550   Sepsis Labs Invalid input(s): PROCALCITONIN,  WBC,   LACTICIDVEN Microbiology No results found for this or any previous visit (from the past 240 hour(s)).    Time coordinating discharge: 35 minutes  SIGNED: Lanae Boast, MD  [facility] 11/14/2020, 1:18 PM  If 7PM-7AM, please contact night-coverage [email or web address]

## 2020-11-11 NOTE — TOC Transition Note (Signed)
Transition of Care (TOC) - CM/SW Discharge Note Donn Pierini RN, BSN Transitions of Care Unit 4E- RN Case Manager See Treatment Team for direct phone #    Patient Details  Name: Andrea Yu MRN: [id number] Date of Birth: January 26, 1940  Transition of Care [facility]) CM/SW Contact:  Darrold Span, RN Phone Number: 11/11/2020, 1:58 PM   Clinical Narrative:    Per MD pt stable for transition home today, pt declining SNF placement and daughter agreeable to return home with [facility]. Pt has caregiver in the home daily from 10a-3pm per daughter.  Orders for HHPT/OT/aide have been placed.  Call made to daughter Clydie Braun to discuss transition plan and needs. Daughter returned call and confirmed plan for pt to return home with [facility] services. Also confirmed caregiver assistance. Per daughter pt has needed DME at the home including walker, w/c, and BSC. Discussed with daughter PT session with pt today- and that pt unable to sit on side of bed or feed herself- daughter verbalized that pt usually able to do these things and unsure why she is choosing not to here. Daughter feels like she will be able to transport home and will plan to be here later this afternoon to provide transport home for her mom.  Address, phone # and PCP all confirmed with daughter.   Choice offered for [facility] services Per CMS guidelines from medicare.gov website with star ratings (copy placed in shadow chart) - Clydie Braun has selected [facility] as first choice and [facility] as second choice.   Call made to [facility] w/ [facility] for [facility] referral- [facility] unable to accept referral at this time.  Call made to [facility] with [facility]- they are unable to accept referral at this time due to staffing.  Call made to Amy with [facility]- they are able to accept referral and can provide HHPT/OT services- they will contact daughter to schedule f/u visits.    Final next level of care: Home w Home Health Services Barriers to Discharge: Barriers Resolved   Patient Goals and  CMS Choice Patient states their goals for this hospitalization and ongoing recovery are:: return home- declining SNF CMS Medicare.gov Compare Post Acute Care list provided to:: Patient Represenative (must comment) (daughter) Choice offered to / list presented to : Adult Children  Discharge Placement              Home w/ HH.         Discharge Plan and Services In-house Referral: Clinical Social Work Discharge Planning Services: CM Consult Post Acute Care Choice: Home Health          DME Arranged: N/A DME Agency: NA       HH Arranged: PT, OT HH Agency: [facility] Date HH Agency Contacted: 11/11/20 Time HH Agency Contacted: 1300 Representative spoke with at [facility] Agency: Amy  Social Determinants of Health (SDOH) Interventions     Readmission Risk Interventions Readmission Risk Prevention Plan 11/11/2020  Transportation Screening Complete  PCP or Specialist Appt within 3-5 Days Not Complete  Not Complete comments pt to schedule within a week  HRI or Home Care Consult Complete  Social Work Consult for Recovery Care Planning/Counseling Complete  Palliative Care Screening Not Applicable  Medication Review Oceanographer) Complete  Some recent data might be hidden

## 2020-11-11 NOTE — Progress Notes (Addendum)
Pt' s HR=120S-150S. Metoprolol 5 mg given. Will continue to monitor the pt. MD aware of HR  Lawson Radar, RN

## 2020-11-11 NOTE — TOC Progression Note (Signed)
Transition of Care [facility]) - Progression Note    Patient Details  Name: Andrea Yu MRN: [id number] Date of Birth: [birth year]  Transition of Care [facility]) CM/SW [place], [place] Phone Number: 11/11/2020, 4:41 PM  Clinical Narrative:     CSW met with daughter, [name], while in the room with the patient. She was tearful and expressed " I can not take her home like this". CSW inquired if patient was able to identify her- she asked the patient, "hey, do you know who I am" the patient states "no". "What is my name", the patient was unable to tell her. CSW provided comfort and advised will start the process for SNF and TOC will alert the MD of change in disposition. She states understanding. CSW explained the SNF process. She states her preferred SNF is [facility]. CSW was given permission to send to other SNFs as back. No questions noted at this time.  CSW will provide bed offers once available  CSW will continue to follow and assist with discharge planning.  Thurmond Butts, MSW, LCSW Clinical Social Worker    Expected Discharge Plan: [place] Barriers to Discharge: Barriers Resolved  Expected Discharge Plan and Services Expected Discharge Plan: [place] In-house Referral: Clinical Social Work Discharge Planning Services: CM Consult Post Acute Care Choice: [place] arrangements for the past 2 months: Single Family Home Expected Discharge Date: 11/11/20               DME Arranged: N/A DME Agency: NA       HH Arranged: PT, OT HH Agency: [place] Date HH Agency Contacted: 11/11/20 Time HH Agency Contacted: 1300 Representative spoke with at [place]: Amy   Social Determinants of Health ([place]) Interventions    Readmission Risk Interventions Readmission Risk Prevention Plan 11/11/2020  Transportation Screening Complete  PCP or Specialist Appt within 3-5 Days Not Complete  Not Complete comments pt to  schedule within a week  [place] or [place] Complete  Social Work Consult for [place] Planning/Counseling Complete  Palliative Care Screening Not Applicable  Medication Review Press photographer) Complete  Some recent data might be hidden

## 2020-11-11 NOTE — Progress Notes (Signed)
Pt is alert and fully oriented. She is able to follow commands and answer questions appropriately. Hard of hearing somewhat can be a barrier for her. Pt is hemodynamically stable. Atrial fibrillation with controlled rate, HR 70s-80s, BP within normal limits. SPO2 94-100% on room air, afebrile, no acute distress noted. We will continue to monitor.  Filiberto Pinks, RN

## 2020-11-11 NOTE — Progress Notes (Signed)
OT Cancellation Note  Patient Details Name: Andrea Yu MRN: [id number] DOB: [birth year]   Cancelled Treatment:     Pt. Refused OT. Discussed pt. With PT who stated she does not want to do anything. Pt. Nurse also states that pt. Will not get up. Discussed with PT and CM that pt. May need hospice or palliative at home. CM to call daughter to discussed.   [name] 11/11/2020, 11:42 AM

## 2020-11-11 NOTE — Progress Notes (Signed)
Physical Therapy Treatment Patient Details Name: Andrea Yu MRN: [id number] DOB: 01/20/1941 Today's Date: 11/11/2020   History of Present Illness Pt is an 80 y.o. female admitted 10/29/20 with confusion, decreased oral intake, urinary frequency; of note, pt with recent UTI. Workup for sepsis secondary to UTI. Course complicated by continue AMS, unclear source of fevers; TEE 10/14 with no evidence of endocarditis. PMH includes PAF, neuropathy, anemia, SAH, HTN, obesity.    PT Comments    Pt unable to tolerate any mobility. Pt reports pain with any touch to her trunk so we attempted but were unable to get to the EOB. Per chart pt is refusing SNF. Currently she is dependent with all care and will be a heavy burden for caregivers at home. If she returns home would maximize [facility] services as well as get a mechanical lift. Would also consider a palliative care consult at home.    Recommendations for follow up therapy are one component of a multi-disciplinary discharge planning process, led by the attending physician.  Recommendations may be updated based on patient status, additional functional criteria and insurance authorization.  Follow Up Recommendations  SNF (Pt refusing. Would maximize HH services)     Equipment Recommendations  Other (comment) (hoyer lift)    Recommendations for Other Services       Precautions / Restrictions Precautions Precautions: Fall;Other (comment) Precaution Comments: bladder/bowel incontinence Restrictions Weight Bearing Restrictions: No     Mobility  Bed Mobility Overal bed mobility: Needs Assistance Bed Mobility: Supine to Sit     Supine to sit: Total assist;HOB elevated     General bed mobility comments: Attempted to assist pt to EOB. Assisted LE's off of bed. Began assisting pt to elevate trunk into sitting and pt yelling that my hands hurt her wherever i touch on her trunk. Pt returned to supine and refused any further attempts.     Transfers                 General transfer comment: Unable to attempt  Ambulation/Gait                 Stairs             Wheelchair Mobility    Modified Rankin (Stroke Patients Only)       Balance                                            Cognition Arousal/Alertness: Awake/alert Behavior During Therapy: WFL for tasks assessed/performed Overall Cognitive Status: No family/caregiver present to determine baseline cognitive functioning                                 General Comments: Pt alert and oriented to place and time but with little to no grasp of her current functional status and what that means if she returns home      Exercises      General Comments        Pertinent Vitals/Pain Pain Assessment: Faces Faces Pain Scale: Hurts whole lot Pain Location: Pt reports pain anywhere she was touched on trunk to assist her into sitting Pain Descriptors / Indicators: Moaning Pain Intervention(s): Monitored during session;Repositioned    Home Living  Prior Function            PT Goals (current goals can now be found in the care plan section) Progress towards PT goals: Not progressing toward goals - comment    Frequency    Min 2X/week      PT Plan Current plan remains appropriate    Co-evaluation              AM-PAC PT "6 Clicks" Mobility   Outcome Measure  Help needed turning from your back to your side while in a flat bed without using bedrails?: Total Help needed moving from lying on your back to sitting on the side of a flat bed without using bedrails?: Total Help needed moving to and from a bed to a chair (including a wheelchair)?: Total Help needed standing up from a chair using your arms (e.g., wheelchair or bedside chair)?: Total Help needed to walk in hospital room?: Total Help needed climbing 3-5 steps with a railing? : Total 6 Click Score: 6    End  of Session   Activity Tolerance: Patient limited by pain Patient left: in bed;with bed alarm set;with call bell/phone within reach Nurse Communication: Mobility status;Need for lift equipment PT Visit Diagnosis: Other abnormalities of gait and mobility (R26.89);Muscle weakness (generalized) (M62.81);Pain Pain - part of body:  (trunk to any touch)     Time: 1017-5102 PT Time Calculation (min) (ACUTE ONLY): 8 min  Charges:  $Therapeutic Activity: 8-22 mins                     [facility] PT Acute Rehabilitation Services Pager [phone] Office [phone]    [name] [facility] 11/11/2020, 1:32 PM

## 2020-11-12 DIAGNOSIS — R638 Other symptoms and signs concerning food and fluid intake: Secondary | ICD-10-CM | POA: Diagnosis not present

## 2020-11-12 DIAGNOSIS — Z515 Encounter for palliative care: Secondary | ICD-10-CM

## 2020-11-12 DIAGNOSIS — Z7189 Other specified counseling: Secondary | ICD-10-CM

## 2020-11-12 DIAGNOSIS — A419 Sepsis, unspecified organism: Secondary | ICD-10-CM | POA: Diagnosis not present

## 2020-11-12 LAB — CREATININE, SERUM
Creatinine, Ser: 0.87 mg/dL (ref 0.44–1.00)
GFR, Estimated: >60 mL/min (ref >60)

## 2020-11-12 MED ORDER — METOPROLOL SUCCINATE ER 50 MG PO TB24: 75.0000 mg | ORAL_TABLET | Freq: Every day | ORAL | Status: DC

## 2020-11-12 MED ORDER — ENSURE ENLIVE PO LIQD
237.0000 mL | Freq: Three times a day (TID) | ORAL | Status: DC
  Administered 2020-11-12 – 2020-11-14 (×5): 237 mL via ORAL

## 2020-11-12 MED ORDER — METOPROLOL SUCCINATE ER 25 MG PO TB24
25.0000 mg | ORAL_TABLET | ORAL | Status: AC
  Administered 2020-11-12: 25 mg via ORAL
  Filled 2020-11-12: qty 1

## 2020-11-12 MED ORDER — ACETAMINOPHEN 500 MG PO TABS
1000.0000 mg | ORAL_TABLET | Freq: Three times a day (TID) | ORAL | Status: DC
  Administered 2020-11-12 – 2020-11-14 (×8): 1000 mg via ORAL
  Filled 2020-11-12 (×8): qty 2

## 2020-11-12 MED ORDER — METOPROLOL SUCCINATE ER 25 MG PO TB24: 25.0000 mg | ORAL_TABLET | ORAL | 0 refills | Status: DC

## 2020-11-12 MED ORDER — METOPROLOL SUCCINATE ER 50 MG PO TB24
75.0000 mg | ORAL_TABLET | Freq: Every day | ORAL | Status: DC
  Administered 2020-11-13 – 2020-11-14 (×2): 75 mg via ORAL
  Filled 2020-11-12 (×2): qty 1

## 2020-11-12 NOTE — Consult Note (Signed)
Consultation Note Date: 11/12/2020   Patient Name: Andrea Yu  DOB: December 18, 1940  MRN: [id number]  Age / Sex: 80 y.o., female  PCP: Leanna Battles, MD Referring Physician: Antonieta Pert, MD  Reason for Consultation: Establishing goals of care  HPI/Patient Profile: 80 y.o. female  with past medical history of paroxysmal atrial fibrillation, neuropathy, GERD, morbid obesity, psoriatic arthritis, iron deficiency anemia, subarachnoid hemorrhage, essential hypertension admitted on 10/29/2020 with increased urinary frequency, decreased po intake, weakness, confusion with severe sepsis due to UTI. Ongoing concern for pain, lethargy, poor intake during hospitalization.   Clinical Assessment and Goals of Care: I met today with [name] and no family at bedside. She is extremely lethargic and not much response. I do get an occasional head nod or "uh huh" but no consistent responses. RN and NT at bedside report declining status over past days with increasing lethargy and interactions as well as decreasing intake. She refuses any lunch during my visit. I did ask if she wanted to be left alone so she can sleep and she nods her head yes.   I called and spoke with daughter, Andrea Yu. Andrea Yu and I discuss her mother's current status. Andrea Yu shares that her mother's confusion and inability to feed herself is new but otherwise she sleeps a lot often and some days she does not eat very well. I expressed my concern that she refused to even make an attempt to eat even with [place] trying to feed her. Andrea Yu reflects that her father ([name]'s husband of ~60 years) died 3 years ago this. She knows that her mother misses him and has made comments about wanting to join him. 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 She also knows that her mother is at peace when it is her time.   Andrea Yu and I also discussed how to move forward. We discussed wishes for code status given above conversation. Andrea Yu shares that her mother has always expressed desire to do everything. Andrea Yu and I discuss if she feels that this would still be her mother's wishes and is resuscitative efforts would be helpful to her mother. I encouraged Andrea Yu to continue to discuss with her brother if this would be helpful to [name]. For now she has decided to remain full code. Andrea Yu is still hopeful that her mother will be able to have some improvement and wishes to pursue SNF rehab placement. We also discussed challenges to SNF rehab if her mother continues with poor intake and unable to participate in therapies. Will await options from TOC. I also mentioned to Andrea Yu that if her mother continues to decline she may even be approaching eligibility for hospice options. They are not prepared to consider these options yet but I wanted to just mention them so we can keep them in the back of our minds.   All questions/concerns  addressed. Emotional support provided.   Primary Decision Maker NEXT OF KIN daughter Andrea Yu and son Andrea Yu    SUMMARY OF RECOMMENDATIONS   - Full code for now - Ongoing goals of care discussions  Code Status/Advance Care Planning: Full code   Symptom Management:  Pain: Avoiding opioids due to lethargy. Tylenol 1000 mg scheduled q8h.   Palliative Prophylaxis:  Aspiration, Bowel Regimen, Delirium Protocol, Frequent Pain Assessment, Oral Care, and Turn Reposition  Additional Recommendations (Limitations, Scope, Preferences): Full Scope Treatment  Prognosis:  Overall prognosis poor with declining functional status and poor intake.   Discharge Planning: To Be Determined      Primary Diagnoses: Present on Admission:  Severe sepsis ([place])  Psoriatic arthritis ([place])  Obesity (BMI 30-39.9)- suspected  sleep apnea, declines sleep study  Adrenal insufficiency (HCC)  PAF (paroxysmal atrial fibrillation) (HCC)  Depression  Iron deficiency anemia- transfused this admission  Essential hypertension  GERD (gastroesophageal reflux disease)   I have reviewed the medical record, interviewed the patient and family, and examined the patient. The following aspects are pertinent.  Past Medical History:  Diagnosis Date   A-fib [facility])    "just after knee 2nd OR" (11/07/2012)   Anemia    Arthritis    RA , SACROTIC ARTHRITIS   Complication of anesthesia    found to be in afib when she woke up   Dysrhythmia    GERD (gastroesophageal reflux disease)    Head injury, closed, with concussion    History of blood transfusion    "after 1 of my knee ORs and today" (11/07/2012)   Hypertension    Peripheral neuropathy    "from back OR in 2005; in hands and feet" (11/07/2012)   Psoriatic arthritis ([place])    on Prednisone Rx   Social History   Socioeconomic History   Marital status: Widowed    Spouse name: Not on file   Number of children: Not on file   Years of education: Not on file   Highest education level: Not on file  Occupational History   Not on file  Tobacco Use   Smoking status: Former    Packs/day: 0.50    Years: 30.00    Pack years: 15.00    Types: Cigarettes    Quit date: 08/09/1981    Years since quitting: 39.2   Smokeless tobacco: Never  Substance and Sexual Activity   Alcohol use: No   Drug use: No   Sexual activity: Not Currently  Other Topics Concern   Not on file  Social History Narrative   Not on file   Social Determinants of Health   Financial Resource Strain: Not on file  Food Insecurity: Not on file  Transportation Needs: Not on file  Physical Activity: Not on file  Stress: Not on file  Social Connections: Not on file   Family History  Problem Relation Age of Onset   Heart attack Father        1951-deceased    Liver disease Mother    Aneurysm Son 38        Deceased    Scheduled Meds:  aspirin EC  81 mg Oral QPM   docusate sodium  100 mg Oral BID   DULoxetine  90 mg Oral Daily   feeding supplement  237 mL Oral BID BM   influenza vaccine adjuvanted  0.5 mL Intramuscular Tomorrow-1000   mouth rinse  15 mL Mouth Rinse BID   [START ON 11/13/2020] metoprolol succinate  75 mg Oral  Daily   multivitamin with minerals  1 tablet Oral Daily   nystatin  5 mL Oral QID   pantoprazole  40 mg Oral Daily   potassium chloride  40 mEq Oral Daily   predniSONE  4 mg Oral Daily   Continuous Infusions:  sodium chloride 50 mL/hr at 11/11/20 1817   PRN Meds:.acetaminophen **OR** acetaminophen, ipratropium-albuterol, lip balm, menthol-cetylpyridinium, metoprolol tartrate, ondansetron **OR** ondansetron (ZOFRAN) IV Allergies  Allergen Reactions   Penicillins Anaphylaxis    THROAT SWELLING  Has patient had a PCN reaction causing immediate rash, facial/tongue/throat swelling, SOB or lightheadedness with hypotension:  # # YES # #  Has patient had a PCN reaction causing severe rash involving mucus membranes or skin necrosis: # # # YES # # # Has patient had a PCN reaction that required hospitalization:No Has patient had a PCN reaction occurring within the last 10 years:  # # YES # #  If all of the above answers are "NO", then may proceed with Cephalosporin use.    Prolia [Denosumab] Other (See Comments)    Severe Pain in the groin area.   Ceftriaxone     Possible cause of neutropenia   Ciprofloxacin Hcl Other (See Comments)   Fosamax [Alendronate Sodium]     UNSPECIFIED REACTION    Latex Swelling   Lisinopril Cough   Tubersol [Tuberculin Ppd] Other (See Comments)    Unknown    Review of Systems  Unable to perform ROS: Mental status change   Physical Exam Vitals and nursing note reviewed.  Constitutional:      General: She is not in acute distress.    Appearance: She is ill-appearing.     Comments: Distress and yelling out whenever moved   Cardiovascular:     Rate and Rhythm: Tachycardia present.  Pulmonary:     Effort: No tachypnea, accessory muscle usage or respiratory distress.  Abdominal:     Palpations: Abdomen is soft.  Neurological:     Mental Status: She is lethargic, disoriented and confused.    Vital Signs: BP (!) 155/67 (BP Location: Right Arm)   Pulse (!) 106   Temp 98.3 F (36.8 C) (Oral)   Resp 19   Ht '4\' 11"'  (1.499 m)   Wt 74.8 kg   SpO2 94%   BMI 33.31 kg/m  Pain Scale: 0-10   Pain Score: Asleep   SpO2: SpO2: 94 % O2 Device:SpO2: 94 % O2 Flow Rate: .O2 Flow Rate (L/min): 2 L/min  IO: Intake/output summary:  Intake/Output Summary (Last 24 hours) at 11/12/2020 1323 Last data filed at 11/12/2020 0800 Gross per 24 hour  Intake 435.33 ml  Output 1150 ml  Net -714.67 ml    LBM: Last BM Date: 11/08/20 Baseline Weight: Weight: 83 kg Most recent weight: Weight: 74.8 kg     Palliative Assessment/Data:     Time In: 1315 Time Out: 1405 Time Total: 50 min Greater than 50%  of this time was spent counseling and coordinating care related to the above assessment and plan.  Signed by: Vinie Sill, NP Palliative Medicine Team Pager # [phone] (M-F 8a-5p) Team Phone # [phone] (Nights/Weekends)

## 2020-11-12 NOTE — Progress Notes (Signed)
Occupational Therapy Treatment Patient Details Name: Andrea Yu MRN: [id number] DOB: Apr 21, 1940 Today's Date: 11/12/2020   History of present illness Pt is an 80 y.o. female admitted 10/29/20 with confusion, decreased oral intake, urinary frequency; of note, pt with recent UTI. Workup for sepsis secondary to UTI. Course complicated by continue AMS, unclear source of fevers; TEE 10/14 with no evidence of endocarditis. PMH includes PAF, neuropathy, anemia, SAH, HTN, obesity.   OT comments  Pt with noted functional decline since previous OT sessions. Pt constantly crying out "help" or when touched. Pt inconsistently follows approx 50% of commands and unable to participate in meaningful activities this AM. Pt overall requires Total A for all ADLs and bed mobility. Per nursing, pt has not been eating well. Due to noted decline and significant assist needed, recommend palliative consult to establish goals of care. Will continue to follow acutely and update recs as appropriate.   SpO2 100% on RA, HR 110s   Recommendations for follow up therapy are one component of a multi-disciplinary discharge planning process, led by the attending physician.  Recommendations may be updated based on patient status, additional functional criteria and insurance authorization.    Follow Up Recommendations  SNF;Supervision/Assistance - 24 hour    Equipment Recommendations  Hospital bed    Recommendations for Other Services Other (comment) (palliative)    Precautions / Restrictions Precautions Precautions: Fall;Other (comment) Precaution Comments: bladder/bowel incontinence Restrictions Weight Bearing Restrictions: No       Mobility Bed Mobility Overal bed mobility: Needs Assistance             General bed mobility comments: Total x 2 for repositioning in bed and scooting up. Unable to attempt EOB due to cognition and crying out constantly    Transfers                 General transfer  comment: Unable to attempt    Balance                                           ADL either performed or assessed with clinical judgement   ADL Overall ADL's : Needs assistance/impaired     Grooming: Wash/dry face;Total assistance;Bed level Grooming Details (indicate cue type and reason): pt yelled out when OT attempted to place washcloth in R hand, but allowed OT to place it in L hand. Pt able to grasp item but would not bring to face to wash despite multimodal cues                               General ADL Comments: unable to participate in meaningful activity, constantly crying out "help" or moaning out in pain with minimal touch but unable to specify location of pain, why calling for help, etc. Per nursing, pt has not been eating either. Total A for all ADLs     Vision   Vision Assessment?: Vision impaired- to be further tested in functional context Additional Comments: to be further assessed, open eyes during session but unable to further assess due to cognition   Perception     Praxis      Cognition Arousal/Alertness: Awake/alert Behavior During Therapy: Restless;Flat affect Overall Cognitive Status: No family/caregiver present to determine baseline cognitive functioning  General Comments: with extended time, pt able to verbalize where pain was ("all over"). follows 50% of commands, inconsistent. unable to follow meaningful directions to wash face or participate in activities        Exercises     Shoulder Instructions       General Comments HR 110s, O2 100% at rest    Pertinent Vitals/ Pain       Pain Assessment: Faces Faces Pain Scale: Hurts even more Pain Location: "all over" Pain Descriptors / Indicators: Moaning;Grimacing;Constant Pain Intervention(s): Monitored during session;Limited activity within patient's tolerance  Home Living                                           Prior Functioning/Environment              Frequency  Min 2X/week        Progress Toward Goals  OT Goals(current goals can now be found in the care plan section)  Progress towards OT goals: Not progressing toward goals - comment  Acute Rehab OT Goals Patient Stated Goal: none stated OT Goal Formulation: With patient Time For Goal Achievement: 11/14/20 Potential to Achieve Goals: Fair ADL Goals Pt Will Perform Eating: with set-up;sitting Pt Will Perform Grooming: with set-up;sitting Pt Will Perform Upper Body Bathing: with mod assist;sitting Pt Will Transfer to Toilet: with mod assist;stand pivot transfer;bedside commode Additional ADL Goal #1: Pt to complete bed mobility at Mod A in prep for ADL transfers Additional ADL Goal #2: Pt to demo ability to sit EOB >5-7 min during functional tasks with no more than min guard assist  Plan Discharge plan remains appropriate    Co-evaluation                 AM-PAC OT "6 Clicks" Daily Activity     Outcome Measure   Help from another person eating meals?: Total Help from another person taking care of personal grooming?: Total Help from another person toileting, which includes using toliet, bedpan, or urinal?: Total Help from another person bathing (including washing, rinsing, drying)?: Total Help from another person to put on and taking off regular upper body clothing?: Total Help from another person to put on and taking off regular lower body clothing?: Total 6 Click Score: 6    End of Session    OT Visit Diagnosis: Other abnormalities of gait and mobility (R26.89);Unsteadiness on feet (R26.81);Muscle weakness (generalized) (M62.81);Other symptoms and signs involving cognitive function   Activity Tolerance Other (comment) (limited by cognition)   Patient Left in bed;with call bell/phone within reach;with bed alarm set   Nurse Communication Mobility status        Time: 0814-4818 OT Time Calculation  (min): 13 min  Charges: OT General Charges $OT Visit: 1 Visit OT Treatments $Self Care/Home Management : 8-22 mins  Bradd Canary, OTR/L Acute Rehab Services Office: [phone]   Lorre Munroe 11/12/2020, 2:00 PM

## 2020-11-12 NOTE — Progress Notes (Signed)
Patient seen and examined this morning. She did not sleep well last night and it appears she sleeps late normally at home also Heart rate was uncontrolled yesterday  A fib RVR- 150s, this morning hr IN low 100s She is alert awake able to tell me her name that she is at [facility] able that Jackquline Bosch is the president and it is October 2022.  Moving all her extremities well. Follows commands well.  Discharge summary/medication updated to include increased dose of metoprolol to 75 mg. Otherwise no changes further plan. She is awaiting on placement to skilled nursing facility as she remains very deconditioned and weak. Palliative care has been consulted while she is here.

## 2020-11-12 NOTE — Progress Notes (Addendum)
Pt is oriented to herself, but disoriented to time, place and situations, able to follow commands. She was more drowsy and sleepy in the evening, but awake all night and kept calling staff repeatedly for help. Staff walked in her room to rechecked and reassured her several times that she got the level of comfort she needed. Her daughter named Clydie Braun called me by phone and stated Pt sleeping pattern usually stays awake very late at night and sleeps mostly on day time. Update given. Clydie Braun expressed appreciations and understanding the prognosis of the Pt and the plan of care.   Per RN day shift reported, Pt's HR was 150s, Atrial with RVR and Metoprolol was administered at 3:30 pm. Her BP remains stable, afebrile, SPO2 98-100% on room air. At rest tonight, her HR is 90s-110s. We will continue to monitor.  Filiberto Pinks, RN

## 2020-11-12 NOTE — Progress Notes (Signed)
Initial Nutrition Assessment  DOCUMENTATION CODES:  Obesity unspecified  INTERVENTION:  Increase Ensure from BID to TID.  Continue MVI with minerals daily.  Recommend feeding assistance with meals.  Encourage PO intake.  NUTRITION DIAGNOSIS:  Inadequate oral intake related to lethargy/confusion as evidenced by meal completion < 50%.  GOAL:  Patient will meet greater than or equal to 90% of their needs  MONITOR:  PO intake, Supplement acceptance, Labs, Weight trends, Skin, I & O's  REASON FOR ASSESSMENT:  Malnutrition Screening Tool    ASSESSMENT:  80 yo female with a PMH of A. fib, GERD, neuropathy, morbid obesity, psoriatic arthritis, iron deficiency anemia, history of subarachnoid hemorrhage, and HTN presented with urinary frequency, decreased oral intake, after recently completed Bactrim course for dysuria. Admitted with severe sepsis.  Pt pending discharge. Discharge summary from MD went in yesterday.  Pt asleep at bedside. Pt did not awaken to touch or voice.  Per Epic, pt with an average intake of 24% (0-90%).   Per Epic, pt has lost ~18 lbs (10%) in the past 11 months, which is not necessarily significant for the time frame.  Recommend increasing Ensure from BID to TID, continue MVI with minerals, and consider feeding assistance to help improve intake.  Supplements: Ensure BID  Medications: reviewed; colace BID, MVI with minerals, Protonix, Klor-Con 40 mEq, prednisone, NaCl @ 50 ml/hr  Labs: reviewed; Na 132 (L), Glucose 105 (H)  NUTRITION - FOCUSED PHYSICAL EXAM: Flowsheet Row Most Recent Value  Orbital Region Mild depletion  Upper Arm Region No depletion  Thoracic and Lumbar Region No depletion  Buccal Region No depletion  Temple Region No depletion  Clavicle Bone Region No depletion  Clavicle and Acromion Bone Region No depletion  Scapular Bone Region No depletion  Dorsal Hand No depletion  Patellar Region No depletion  Anterior Thigh Region No  depletion  Posterior Calf Region No depletion  Edema (RD Assessment) Mild  [Generalized]  Hair Reviewed  Eyes Unable to assess  Mouth Reviewed  [Dark blisters on lips]  Skin Reviewed  Nails Reviewed   Diet Order:   Diet Order             DIET DYS 3 Room service appropriate? No; Fluid consistency: Thin  Diet effective now           Diet - low sodium heart healthy                  EDUCATION NEEDS:  Education needs have been addressed  Skin:  Skin Assessment: Skin Integrity Issues: Skin Integrity Issues:: Other (Comment) Other: Skin tear on upper back  Last BM:  11/12/20 - Type 5, yellow, medium  Height:  Ht Readings from Last 1 Encounters:  10/30/20 4\' 11"  (1.499 m)   Weight:  Wt Readings from Last 1 Encounters:  11/08/20 74.8 kg   BMI:  Body mass index is 33.31 kg/m.  Estimated Nutritional Needs:  Kcal:  1750-1950 Protein:  95-110 grams Fluid:  >1.75 L  11/10/20, RD, LDN (she/her/hers) Registered Dietitian I After-Hours/Weekend Pager # in [place]

## 2020-11-13 DIAGNOSIS — Z66 Do not resuscitate: Secondary | ICD-10-CM

## 2020-11-13 DIAGNOSIS — Z7189 Other specified counseling: Secondary | ICD-10-CM | POA: Diagnosis not present

## 2020-11-13 DIAGNOSIS — A419 Sepsis, unspecified organism: Secondary | ICD-10-CM | POA: Diagnosis not present

## 2020-11-13 DIAGNOSIS — Z515 Encounter for palliative care: Secondary | ICD-10-CM | POA: Diagnosis not present

## 2020-11-13 DIAGNOSIS — R6520 Severe sepsis without septic shock: Secondary | ICD-10-CM | POA: Diagnosis not present

## 2020-11-13 MED ORDER — ORAL CARE MOUTH RINSE
15.0000 mL | Freq: Two times a day (BID) | OROMUCOSAL | Status: DC
  Administered 2020-11-13 (×2): 15 mL via OROMUCOSAL

## 2020-11-13 MED ORDER — CHLORHEXIDINE GLUCONATE 0.12 % MT SOLN
15.0000 mL | Freq: Two times a day (BID) | OROMUCOSAL | Status: DC
  Administered 2020-11-13 – 2020-11-14 (×3): 15 mL via OROMUCOSAL
  Filled 2020-11-13: qty 15

## 2020-11-13 NOTE — Progress Notes (Signed)
Palliative:  HPI: 80 y.o. female  with past medical history of paroxysmal atrial fibrillation, neuropathy, GERD, morbid obesity, psoriatic arthritis, iron deficiency anemia, subarachnoid hemorrhage, essential hypertension admitted on 10/29/2020 with increased urinary frequency, decreased po intake, weakness, confusion with severe sepsis due to UTI. Ongoing concern for pain, lethargy, poor intake during hospitalization.    I met today again with Andrea Yu. She is MUCH improved from yesterday. She is awake, alert, and completely oriented and very sharp. She is unaware of how ill she has been and tells me "I thought it was just a urinary infection." She shares that she is feeling very good today and pain is controlled. She is motivated to work with physical therapy when they come to work with her. We discussed need to work with PT as she has been very weak and she may need rehab stay. Andrea Yu is very insistent that she return home to her dog, [name], and she has a caregiver and family present mostly when caregiver not present.   Andrea Yu had a good, long discussion where she discusses missing her husband who passed 3 years ago. She shares how much she misses him and with this she shares that she is ready when her time comes and does not fear this. During this discussion I took the opportunity to ask her wishes for code status and if she would ever want CPR or breathing machine or life support and she tells me she does not want these interventions. I explained that this would be what I would call DNR or do not resuscitate and she agrees this is her desire. She shares that when her time comes she wishes to be at [facility] (she calls this by name) stating this is right near her home and that her husband was there for just a day at the end of his life. She shares that these are the decisions she made for him and she wants the same for herself. She agrees to continuing current interventions with hopes of  improvement and goal to return to her home and continue therapy there. She gives me permission to call and update daughter, [name], on her condition and our conversation today.   I called and spoke with daughter, [name]. I happily shared with her that her mother is much improved from yesterday. [name] is ecstatic to receive this news. I further relayed our conversation today and what her mother shared and her husband and desire for DNR. [name] is supportive of DNR if this is what her mother has expressed. I also shared that her mother has expressed desire to return home and encouraged them to discuss further after PT evaluation.   All questions/concerns addressed. Emotional support provided.   Exam: Alert, oriented. No distress. Very pleasant and conversational. Breathing regular, unlabored. Abd soft.   Plan: - DNR decided by patient and supported by daughter. Hopeful for improvement and continued treatment otherwise. Goals are clear.  - Patient wishes to return home - await PT to make sure this will be safe and further discussion with family and caregiver.   36 min  Vinie Sill, NP Palliative Medicine Team Pager [phone] (Please see [email or web address] for schedule) Team Phone [phone]    Greater than 50%  of this time was spent counseling and coordinating care related to the above assessment and plan

## 2020-11-13 NOTE — Progress Notes (Signed)
PROGRESS NOTE    Andrea Yu  KHT:[id number] DOB: Sep 28, 1940 DOA: 10/29/2020 PCP: Andrea Matin, MD   Chief Complaint  Patient presents with   Dehydration   Brief Narrative/Hospital Course:  Andrea Yu, 80 y.o. female with PMH of  A. fib not on anticoagulation, GERD, neuropathy, morbid obesity, psoriatic arthritis, iron deficiency anemia history of subarachnoid hemorrhage, HTN presented to the ED with urinary frequency, decreased oral intake, after recently completed Bactrim course for dysuria. She was seen in the ED found to have leukocytosis tachycardia tachypnea fever hypotension and diagnosed with UTI, CT abdomen pelvis showed fluid collection inferior portion of the right buttock region, inferior to the right ischial tuberosity, blood cultures were obtained and admitted on IV antibiotics.  ID was consulted.  Blood culture 10/5 with positive for Streptococcus para sanguinous 1 out of 4 bottles, felt contaminant. Hospital course was complicated by recurrent fever without clear source and also had episode of altered mental status 10/13 code stroke activated, patient returned to baseline.  She had further work-up with TEE 10/14 for her fever and endocarditis was ruled out.  CT chest abdomen pelvis 10/14 unremarkable for any acute finding, seen by IR and no indication for aspiration of the fluid.  Vancomycin was discontinued 10/15 after negative CT abdomen   Subjective: Seen this morning she is much more alert awake oriented this morning.  Able to tell me current president's name that she is at [facility].  Complained of having too much Serophene pancake this morning She has an been much up her ambulatory remains deconditioned and weak  Assessment & Plan:  Intermittent fever with unclear cause UTI: Patient treated for severe sepsis likely due to RVU:YEBXIDHWY antibiotics.urine culture multiple species, blood culture with contamination otherwise repeat repeat blood culture 10/11  negative.for recurrent fever underwentTEE - negative for endocarditis, interarterial shunt present-but vascular duplex negative for DVT/not a candidate for anticoagulation. Patient was on ceftriaxone changed to IV vancomycin due to concern for drug fever- Off vancomycin 10/15 as repeat CT abdomen pelvis 10/14 showed similar size of fluid collection in the right buttock-no indication for drainage as per IR.  At this time afebrile no leukocytosis ID signed off.  Remains afebrile.   Streptococcus parasanguinis 1/4 bottle likely a contaminant.   Acute metabolic encephalopathy multifactorial CT head 10/10 chronic small vessel disease she remains deconditioned and weak and frail.  Mental status at times fluctuating, sleeps late appears deconditioned and weak this morning she is alert awake oriented to self current president current place.  Yesterday she was very confused oriented x1.  Suspect in the setting of her UTI and multiple comorbidities always..  EP has been consulted to discuss goals of care.  Currently remains full code. seen by neurology.   Hypokalemia: Resolved.  Continue 40 of Kdur daily.  Mild hypovolemic hyponatremia-stable. [place]ap metabolic acidosis bicarb improved  Chronic diastolic CHF: Euvolemic. Weight has improved since admission.  Seen by cardiology Net IO Since Admission: 2,688.23 mL [11/13/20 1302]  Filed Weights   10/30/20 0700 11/04/20 0327 11/08/20 0311  Weight: 83 kg 86.9 kg 74.8 kg   Chronic anxiety/depression: Continue home Cymbalta.  Mood is stable. Anemia likely from chronic disease: Hemoglobin is stable cont outpatient follow-up with PCP and as needed GI follow-up Recent Labs  Lab 11/08/20 0454 11/10/20 0601  HGB 9.5* 9.4*  HCT 29.7* 30.2*    Generalized deconditioning/physical debility: Multifactorial with comorbidities.  Remains deconditioned and weak frail.  Has not been mobile or ambulatory much.  Adrenal insufficiency history it appears she was on  prednisone-continue.   PAF not on anticoagulation.  Atrial was not well controlled Metropol Increased to 75 mg twice daily.  Off Cardizem.history of SAH/anemia/fall-not a candidate for anticoagulation per Cariology- signed off 10/17   Psoriatic arthritis -Tylenol, diclofenac gel Peripheral neuropathy-supportive care , home Neurontin Hx of SAH Essential hypertension: Stable on metoprolol.  Resume home losartan on d.c.   GERD:on ppi  Goals of care: Patient had multiple comorbidities, has been deconditioned and weak and frail, mental status fluctuating this morning stable.  Seen by palliative care.  If she declines further if she has poor intake and failure to thrive will be guarded.  Family looking to for short-term rehab.  Palliative care input appreciated  Class I  Obesity:Patient's Body mass index is 33.31 kg/m. : Will benefit with PCP follow-up, weight loss  healthy lifestyle and outpatient sleep evaluation.  DVT prophylaxis:  Code Status:   Code Status: Full Code Family Communication: plan of care discussed with patient at bedside. Status is: Inpatient  Remains inpatient appropriate because: Unsafe disposition pending placement/family decision/palliative care input.  Objective: Vitals last 24 hrs: Vitals:   11/12/20 2343 11/13/20 0332 11/13/20 0743 11/13/20 1106  BP: (!) 145/66 (!) 141/57 125/68 (!) 143/66  Pulse: 87 82 76 88  Resp: 18 20 20 19   Temp: 98.1 F (36.7 C) 98 F (36.7 C) 97.6 F (36.4 C) 98.2 F (36.8 C)  TempSrc: Oral Oral Oral Oral  SpO2: 100% 96% 100% 100%  Weight:      Height:       Weight change:   Intake/Output Summary (Last 24 hours) at 11/13/2020 1259 Last data filed at 11/13/2020 0800 Gross per 24 hour  Intake 355 ml  Output --  Net 355 ml   Net IO Since Admission: 2,688.23 mL [11/13/20 1259]   Physical Examination: General exam: Aa0x place, self, president, weak frail HEENT:Oral mucosa moist, Ear/Nose WNL grossly,dentition  normal. Respiratory system: B/l diminished BS, no use of accessory muscle, non tender. Cardiovascular system: S1 & S2 +,No JVD. Gastrointestinal system: Abdomen soft, NT,ND, BS+. Nervous System:Alert, awake, moving extremities. Extremities: edema none distal peripheral pulses palpable.  Skin: No rashes, no icterus. MSK: Normal muscle bulk, tone, power.  Medications reviewed:  Scheduled Meds:  acetaminophen  1,000 mg Oral Q8H   aspirin EC  81 mg Oral QPM   chlorhexidine  15 mL Mouth Rinse BID   docusate sodium  100 mg Oral BID   DULoxetine  90 mg Oral Daily   feeding supplement  237 mL Oral TID BM   influenza vaccine adjuvanted  0.5 mL Intramuscular Tomorrow-1000   mouth rinse  15 mL Mouth Rinse BID   mouth rinse  15 mL Mouth Rinse q12n4p   metoprolol succinate  75 mg Oral Daily   multivitamin with minerals  1 tablet Oral Daily   nystatin  5 mL Oral QID   pantoprazole  40 mg Oral Daily   potassium chloride  40 mEq Oral Daily   predniSONE  4 mg Oral Daily   Continuous Infusions:  sodium chloride 50 mL/hr at 11/13/20 1101   Pressure Injury 04/29/17 Stage II -  Partial thickness loss of dermis presenting as a shallow open ulcer with a red, pink wound bed without slough. healing callus, open pink wound bed (Active)  04/29/17 0800  Location: Heel  Location Orientation: Right  Staging: Stage II -  Partial thickness loss of dermis presenting as a shallow open ulcer  with a red, pink wound bed without slough.  Wound Description (Comments): healing callus, open pink wound bed  Present on Admission:     Diet Order             DIET DYS 3 Room service appropriate? No; Fluid consistency: Thin  Diet effective now           Diet - low sodium heart healthy                   Nutrition Problem: Inadequate oral intake Etiology: lethargy/confusion Signs/Symptoms: meal completion < 50% Interventions: Ensure Enlive (each supplement provides 350kcal and 20 grams of protein),  MVI  Weight change:   Wt Readings from Last 3 Encounters:  11/08/20 74.8 kg  12/13/19 83 kg  05/25/18 65.3 kg     Consultants:see note  Procedures:see note Antimicrobials: Anti-infectives (From admission, onward)    Start     Dose/Rate Route Frequency Ordered Stop   11/06/20 1100  vancomycin (VANCOREADY) IVPB 750 mg/150 mL  Status:  Discontinued        750 mg 150 mL/hr over 60 Minutes Intravenous Every 24 hours 11/05/20 0926 11/08/20 1308   11/05/20 1100  vancomycin (VANCOREADY) IVPB 1750 mg/350 mL        1,750 mg 175 mL/hr over 120 Minutes Intravenous  Once 11/05/20 0926 11/05/20 1321   10/31/20 1630  vancomycin (VANCOCIN) IVPB 1000 mg/200 mL premix  Status:  Discontinued        1,000 mg 200 mL/hr over 60 Minutes Intravenous Every 48 hours 10/30/20 1056 10/30/20 1350   10/31/20 1200  cefTRIAXone (ROCEPHIN) 1 g in sodium chloride 0.9 % 100 mL IVPB  Status:  Discontinued        1 g 200 mL/hr over 30 Minutes Intravenous Every 24 hours 10/31/20 0812 10/31/20 0936   10/31/20 1200  cefTRIAXone (ROCEPHIN) 2 g in sodium chloride 0.9 % 100 mL IVPB  Status:  Discontinued        2 g 200 mL/hr over 30 Minutes Intravenous Every 24 hours 10/31/20 0936 11/05/20 0926   10/30/20 1930  aztreonam (AZACTAM) 1 g in sodium chloride 0.9 % 100 mL IVPB  Status:  Discontinued        1 g 200 mL/hr over 30 Minutes Intravenous Every 8 hours 10/30/20 1857 10/31/20 0812   10/30/20 0000  aztreonam (AZACTAM) 2 g in sodium chloride 0.9 % 100 mL IVPB  Status:  Discontinued        2 g 200 mL/hr over 30 Minutes Intravenous Every 8 hours 10/29/20 1829 10/30/20 1350   10/29/20 1515  aztreonam (AZACTAM) 2 g in sodium chloride 0.9 % 100 mL IVPB        2 g 200 mL/hr over 30 Minutes Intravenous  Once 10/29/20 1502 10/29/20 1617   10/29/20 1515  metroNIDAZOLE (FLAGYL) IVPB 500 mg        500 mg 100 mL/hr over 60 Minutes Intravenous  Once 10/29/20 1502 10/29/20 1740   10/29/20 1515  vancomycin (VANCOREADY) IVPB 1250  mg/250 mL        1,250 mg 166.7 mL/hr over 90 Minutes Intravenous  Once 10/29/20 1502 10/29/20 1814      Culture/Microbiology    Component Value Date/Time   SDES BLOOD RIGHT HAND 11/04/2020 0800   SPECREQUEST  11/04/2020 0800    BOTTLES DRAWN AEROBIC AND ANAEROBIC Blood Culture adequate volume   CULT  11/04/2020 0800    NO GROWTH 5 DAYS Performed at [facility]  Hospital Lab, [address], [place], [place] [address]    REPTSTATUS 11/09/2020 FINAL 11/04/2020 0800    Other culture-see note  Unresulted Labs (From admission, onward)     Start     Ordered   11/05/20 0500  Creatinine, serum  (enoxaparin (LOVENOX)    CrCl < 30 ml/min)  Weekly,   R     Comments: while on enoxaparin therapy.    10/29/20 1729   10/29/20 1505  Miscellaneous Genetic Test (non-interfaced send-out)  Once,   STAT       Comments: Septicyte   10/29/20 1504           Data Reviewed: I have personally reviewed following labs and imaging studies CBC: Recent Labs  Lab 11/08/20 0454 11/10/20 0601  WBC 7.8 3.3*  HGB 9.5* 9.4*  HCT 29.7* 30.2*  MCV 85.6 87.0  PLT 312 336   Basic Metabolic Panel: Recent Labs  Lab 11/08/20 0454 11/09/20 0152 11/10/20 0601 11/12/20 0520  NA 133* 133* 132*  --   K 3.2* 4.0 3.8  --   CL 108 108 104  --   CO2 16* 18* 22  --   GLUCOSE 83 88 105*  --   BUN 9 12 12   --   CREATININE 0.93 0.92 0.93 0.87  CALCIUM 8.0* 8.3* 8.6*  --   MG  --  1.9  --   --    GFR: Estimated Creatinine Clearance: 44.7 mL/min (by C-G formula based on SCr of 0.87 mg/dL). Liver Function Tests: No results for input(s): AST, ALT, ALKPHOS, BILITOT, PROT, ALBUMIN in the last 168 hours. No results for input(s): LIPASE, AMYLASE in the last 168 hours. No results for input(s): AMMONIA in the last 168 hours. Coagulation Profile: No results for input(s): INR, PROTIME in the last 168 hours. Cardiac Enzymes: No results for input(s): CKTOTAL, CKMB, CKMBINDEX, TROPONINI in the last 168 hours. BNP (last 3  results) No results for input(s): PROBNP in the last 8760 hours. HbA1C: No results for input(s): HGBA1C in the last 72 hours. CBG: Recent Labs  Lab 11/07/20 0645  GLUCAP 84   Lipid Profile: No results for input(s): CHOL, HDL, LDLCALC, TRIG, CHOLHDL, LDLDIRECT in the last 72 hours. Thyroid Function Tests: No results for input(s): TSH, T4TOTAL, FREET4, T3FREE, THYROIDAB in the last 72 hours. Anemia Panel: No results for input(s): VITAMINB12, FOLATE, FERRITIN, TIBC, IRON, RETICCTPCT in the last 72 hours. Sepsis Labs: No results for input(s): PROCALCITON, LATICACIDVEN in the last 168 hours.  Recent Results (from the past 240 hour(s))  Urine Culture     Status: None   Collection Time: 11/04/20  5:50 AM   Specimen: Urine, Clean Catch  Result Value Ref Range Status   Specimen Description URINE, CLEAN CATCH  Final   Special Requests NONE  Final   Culture   Final    NO GROWTH Performed at [facility], [address], [place], [place]    Report Status 11/04/2020 FINAL  Final  Culture, blood (routine x 2)     Status: None   Collection Time: 11/04/20  7:51 AM   Specimen: BLOOD LEFT HAND  Result Value Ref Range Status   Specimen Description BLOOD LEFT HAND  Final   Special Requests   Final    BOTTLES DRAWN AEROBIC AND ANAEROBIC Blood Culture adequate volume   Culture   Final    NO GROWTH 5 DAYS Performed at [facility], [address], [place], [place]  [address]    Report Status 11/09/2020 FINAL  Final  Culture, blood (routine x 2)     Status: None   Collection Time: 11/04/20  8:00 AM   Specimen: BLOOD RIGHT HAND  Result Value Ref Range Status   Specimen Description BLOOD RIGHT HAND  Final   Special Requests   Final    BOTTLES DRAWN AEROBIC AND ANAEROBIC Blood Culture adequate volume   Culture   Final    NO GROWTH 5 DAYS Performed at [facility] Lab, [address], [place], [place] [address]    Report Status 11/09/2020 FINAL  Final     Radiology  Studies: No results found.   LOS: 15 days   Lanae Boast, MD [facility]  11/13/2020, 12:59 PM

## 2020-11-13 NOTE — Progress Notes (Signed)
Physical Therapy Treatment Patient Details Name: Andrea Yu MRN: [id number] DOB: 1940-01-26 Today's Date: 11/13/2020   History of Present Illness Pt is an 80 y.o. female admitted 10/29/20 with confusion, decreased oral intake, urinary frequency; of note, pt with recent UTI. Workup for sepsis secondary to UTI. Course complicated by continue AMS, unclear source of fevers; TEE 10/14 with no evidence of endocarditis. Vancomycin was discontinued 10/15 after negative CT abdomen. PMH includes PAF, neuropathy, anemia, SAH, HTN, obesity.   PT Comments    Pt slowly progressing with mobility. Noted improved alertness this session, conversant and following simple commands; pt's caregiver present and reports pt closer to baseline cognition. Today's session focused on bed mobility and sitting balance with caregiver present to assist with mobility; pt declining attempts to stand or transfer to recliner. Pt remains limited by generalized weakness, decreased activity tolerance, poor balance strategies/postural reactions and impaired cognition. Caregiver requesting hospital bed for home, but unsure if they will need a hoyer lift.    Recommendations for follow up therapy are one component of a multi-disciplinary discharge planning process, led by the attending physician.  Recommendations may be updated based on patient status, additional functional criteria and insurance authorization.  Follow Up Recommendations  SNF;Supervision/Assistance - 24 hour (vs. HHPT if family can provide necessary assist)     Equipment Recommendations  Hospital bed; potential hoyer lift (caregiver unsure if needed)    Recommendations for Other Services       Precautions / Restrictions Precautions Precautions: Fall;Other (comment) Precaution Comments: bladder/bowel incontinence Restrictions Other Position/Activity Restrictions: R ankle fused.  States does not walk at home, family assists with transfers and ADL     Mobility   Bed Mobility Overal bed mobility: Needs Assistance Bed Mobility: Supine to Sit;Sit to Supine Rolling: Min assist   Supine to sit: Mod assist;HOB elevated Sit to supine: Mod assist;+2 for physical assistance   General bed mobility comments: Caregiver present to assist PT with pt's bed mobility, pt able to roll to L-side with minA for HHA from caregiver; modA for trunk elevation and scooting hips to sit EOB, modA+2 for return to supine for trunk and BLE management; assist +2 for scooting up in bed    Transfers                 General transfer comment: pt declined despite caregiver present to assist with transfer  Ambulation/Gait                 Stairs             Wheelchair Mobility    Modified Rankin (Stroke Patients Only)       Balance Overall balance assessment: Needs assistance Sitting-balance support: No upper extremity supported;Feet unsupported Sitting balance-Leahy Scale: Fair Sitting balance - Comments: min guard without UE support, but quick to fatigue with increased lean requiring verbal/tactile cues to remain upright; modA to prevent LOB with back "muscle spasm" Postural control: Right lateral lean;Posterior lean                                  Cognition Arousal/Alertness: Awake/alert Behavior During Therapy: WFL for tasks assessed/performed;Impulsive Overall Cognitive Status: History of cognitive impairments - at baseline                                 General Comments: Pt's caregiver present and  reports pt with improved clarity/alertness, "back to her old self" - still with some confusion, memory deficits, decreased attention and poor problem solving      Exercises General Exercises - Lower Extremity Long Arc Quad: AROM;Both;Seated (partial range) Hip ABduction/ADduction: AROM;Both;Seated (partial range)    General Comments General comments (skin integrity, edema, etc.): Pt's caregiver of 3 years Kennyth Arnold)  present and supportive; discussed potential DME needs - she is interested in hospital bed, but not sure about need for hoyer lift. Family repeatedly stating, "I want to go home... I just want to go home" during session, caregiver unsure if family plans for d/c straight home or SNF before home      Pertinent Vitals/Pain Pain Assessment: Faces Faces Pain Scale: Hurts little more Pain Location: back Pain Descriptors / Indicators: Grimacing;Constant;Guarding Pain Intervention(s): Monitored during session;Limited activity within patient's tolerance;Other (comment) ("rub my back")    Home Living                      Prior Function            PT Goals (current goals can now be found in the care plan section) Acute Rehab PT Goals Patient Stated Goal: "I want to go home and sleep in my own bed" PT Goal Formulation: With patient Time For Goal Achievement: 11/27/20 Potential to Achieve Goals: Fair Progress towards PT goals: Progressing toward goals    Frequency    Min 2X/week      PT Plan Current plan remains appropriate    Co-evaluation              AM-PAC PT "6 Clicks" Mobility   Outcome Measure  Help needed turning from your back to your side while in a flat bed without using bedrails?: A Lot Help needed moving from lying on your back to sitting on the side of a flat bed without using bedrails?: A Lot Help needed moving to and from a bed to a chair (including a wheelchair)?: Total Help needed standing up from a chair using your arms (e.g., wheelchair or bedside chair)?: Total Help needed to walk in hospital room?: Total Help needed climbing 3-5 steps with a railing? : Total 6 Click Score: 8    End of Session   Activity Tolerance: Patient tolerated treatment well;Patient limited by fatigue Patient left: in bed;with bed alarm set;with call bell/phone within reach Nurse Communication: Mobility status PT Visit Diagnosis: Other abnormalities of gait and mobility  (R26.89);Muscle weakness (generalized) (M62.81);Pain     Time: 1191-4782 PT Time Calculation (min) (ACUTE ONLY): 24 min  Charges:  $Therapeutic Activity: 8-22 mins $Self Care/Home Management: 8-22                     Ina Homes, PT, DPT Acute Rehabilitation Services  Pager [phone] Office [phone]  Malachy Chamber 11/13/2020, 4:44 PM

## 2020-11-14 MED ORDER — METOPROLOL SUCCINATE ER 25 MG PO TB24: 25.0000 mg | ORAL_TABLET | Freq: Every day | ORAL | 0 refills | Status: DC

## 2020-11-14 NOTE — TOC Transition Note (Addendum)
Transition of Care [facility]) - CM/SW Discharge Note   Patient Details  Name: Andrea Yu MRN: [id number] Date of Birth: [birth year]  Transition [facility] Care [facility]) CM/SW Contact:  Erin Sons, LCSW Phone Number: 11/14/2020, 4:23 PM   Clinical Narrative:     PTAR called for transport home.  Final next level of care: Home w Home Health Services Barriers to Discharge: Barriers Resolved   Patient Goals and CMS Choice Patient states their goals for this hospitalization and ongoing recovery are:: return home- declining SNF CMS Medicare.gov Compare Post Acute Care list provided to:: Patient Represenative (must comment) (daughter) Choice offered to / list presented to : Adult Children  Discharge Placement                       Discharge Plan and Services In-house Referral: Clinical Social Work Discharge Planning Services: CM Consult Post Acute Care Choice: Home Health          DME Arranged: N/A DME Agency: NA       HH Arranged: PT, OT HH Agency: [facility] Date HH Agency Contacted: 11/11/20 Time HH Agency Contacted: 1300 Representative spoke with at [facility] Agency: Amy  Social Determinants of Health (SDOH) Interventions     Readmission Risk Interventions Readmission Risk Prevention Plan 11/11/2020  Transportation Screening Complete  PCP or Specialist Appt within 3-5 Days Not Complete  Not Complete comments pt to schedule within a week  HRI or Home Care Consult Complete  Social Work Consult for Recovery Care Planning/Counseling Complete  Palliative Care Screening Not Applicable  Medication Review Oceanographer) Complete  Some recent data might be hidden

## 2020-11-14 NOTE — Progress Notes (Signed)
Occupational Therapy Treatment Patient Details Name: Andrea Yu MRN: [id number] DOB: 1940-07-05 Today's Date: 11/14/2020   History of present illness Pt is an 80 y.o. female admitted 10/29/20 with confusion, decreased oral intake, urinary frequency; of note, pt with recent UTI. Workup for sepsis secondary to UTI. Course complicated by continue AMS, unclear source of fevers; TEE 10/14 with no evidence of endocarditis. Vancomycin was discontinued 10/15 after negative CT abdomen. PMH includes PAF, neuropathy, anemia, SAH, HTN, obesity.   OT comments  Pt with slower progress towards OT goals (extended and updated accordingly) though more talkative and improving command following. Pt overall Mod-Max A for bed mobility to sit EOB 5 min before fatiguing and requesting to return to supine. Pt was able to demo one brief stand with Max A but unable to sustain for successful pivot today. Pt continues to require extensive assist for ADLs. Educated pt that if she were to return home (repeated "I want to go home" throughout session), would need assist for ADLs bed level, hospital bed and likely a lift for transfers out of bed.    Recommendations for follow up therapy are one component of a multi-disciplinary discharge planning process, led by the attending physician.  Recommendations may be updated based on patient status, additional functional criteria and insurance authorization.    Follow Up Recommendations  SNF;Supervision/Assistance - 24 hour    Equipment Recommendations  Hospital bed    Recommendations for Other Services Other (comment) (palliative)    Precautions / Restrictions Precautions Precautions: Fall;Other (comment) Precaution Comments: bladder/bowel incontinence Restrictions Weight Bearing Restrictions: No Other Position/Activity Restrictions: R ankle fused.  States does not walk at home, family assists with transfers and ADL       Mobility Bed Mobility Overal bed mobility: Needs  Assistance Bed Mobility: Supine to Sit;Sit to Supine     Supine to sit: Max assist Sit to supine: Mod assist   General bed mobility comments: Max A to get EOB with HOB elevated, cues to reach to bedrails when able and pt able to assist with BLE. Able to guide trunk back into bed with light assist for LEs    Transfers Overall transfer level: Needs assistance Equipment used: 1 person hand held assist Transfers: Sit to/from Stand Sit to Stand: Max assist         General transfer comment: Max A for sit to stand attempt at bedside. Initially trialed to initiate with RW then with just therapist assist in front. Pt briefly able to stand but unable to sustain upright posture with L foot sliding forward. Pt unclear how she transfers at home.    Balance   Sitting-balance support: No upper extremity supported;Feet unsupported Sitting balance-Leahy Scale: Fair Sitting balance - Comments: able to sit EOB with UE support, unable to accept challenges   Standing balance support: Bilateral upper extremity supported;During functional activity Standing balance-Leahy Scale: Zero                             ADL either performed or assessed with clinical judgement   ADL Overall ADL's : Needs assistance/impaired     Grooming: Moderate assistance;Bed level;Wash/dry face Grooming Details (indicate cue type and reason): cues for thoroughness (assist needed to reach forehead, etc) but pt able to wipe eyes and mouth with cues             Lower Body Dressing: Total assistance;Bed level Lower Body Dressing Details (indicate cue type and reason):  to don clean socks bed level               General ADL Comments: Session focused on attempt of OOB progression and for further cognitive assessment during functional tasks. Due to weakness and inability to safely stand yet - educated that if pt were to go home, ADLs bed level would be the safest option     Vision   Vision Assessment?:  No apparent visual deficits   Perception     Praxis      Cognition Arousal/Alertness: Awake/alert Behavior During Therapy: WFL for tasks assessed/performed;Impulsive Overall Cognitive Status: History of cognitive impairments - at baseline                                 General Comments: improved alertness and ability to follow commands, still noted with confusion and memory deficits (reports MD did not tell her she would need to be able to transfer to go home safely though this OT heard that conversation with pt). Slow motor planning and poor awareness of deficits and increased assist needed despite education        Exercises     Shoulder Instructions       General Comments      Pertinent Vitals/ Pain       Pain Assessment: Faces Faces Pain Scale: Hurts little more Pain Location: back Pain Descriptors / Indicators: Grimacing;Constant;Guarding Pain Intervention(s): Monitored during session;Limited activity within patient's tolerance  Home Living                                          Prior Functioning/Environment              Frequency  Min 2X/week        Progress Toward Goals  OT Goals(current goals can now be found in the care plan section)  Progress towards OT goals: Progressing toward goals  Acute Rehab OT Goals Patient Stated Goal: "I want to go home" OT Goal Formulation: With patient Time For Goal Achievement: 11/28/20 Potential to Achieve Goals: Fair ADL Goals Pt Will Perform Eating: with set-up;sitting Pt Will Perform Grooming: with set-up;sitting Pt Will Perform Upper Body Bathing: with mod assist;sitting Pt Will Transfer to Toilet: with mod assist;stand pivot transfer;bedside commode Additional ADL Goal #1: Pt to complete bed mobility at Mod A in prep for ADL transfers Additional ADL Goal #2: Pt to demo ability to sit EOB >5-7 min during functional tasks with no more than min guard assist  Plan Discharge plan  remains appropriate    Co-evaluation                 AM-PAC OT "6 Clicks" Daily Activity     Outcome Measure   Help from another person eating meals?: A Lot Help from another person taking care of personal grooming?: A Lot Help from another person toileting, which includes using toliet, bedpan, or urinal?: Total Help from another person bathing (including washing, rinsing, drying)?: A Lot Help from another person to put on and taking off regular upper body clothing?: A Lot Help from another person to put on and taking off regular lower body clothing?: Total 6 Click Score: 10    End of Session Equipment Utilized During Treatment: Gait belt  OT Visit Diagnosis: Other abnormalities of gait and mobility (R26.89);Unsteadiness on  feet (R26.81);Muscle weakness (generalized) (M62.81);Other symptoms and signs involving cognitive function   Activity Tolerance Other (comment);Patient limited by fatigue (limited by cognition)   Patient Left in bed;with call bell/phone within reach;with bed alarm set   Nurse Communication Mobility status        Time: 6256-3893 OT Time Calculation (min): 26 min  Charges: OT General Charges $OT Visit: 1 Visit OT Treatments $Self Care/Home Management : 8-22 mins $Therapeutic Activity: 8-22 mins  Bradd Canary, OTR/L Acute Rehab Services Office: [phone]   Lorre Munroe 11/14/2020, 12:14 PM

## 2020-11-14 NOTE — Progress Notes (Signed)
Seen and examined this morning.  she is alert awake oriented at baseline.  She is deconditioned and weak but at this time she is declining to go to skilled nursing facility and wants to go home today.  Vitals stable no chest pain shortness of breath.  She is doing well on room air.  Seen by palliative care and has been changed to DNR if worsens in the future aware about hospice. She is medically stable for discharge. Will arrange for home with home health.  Daughter declined hospital bed. TOC will arrange [facility]

## 2020-11-14 NOTE — Progress Notes (Addendum)
Pt in a stable condition,IV removed, patient transported home by PTAR along with her personal belongings. CCMD notified. Pt's daughter Andrea Yu called on the phone to let her know that the patient is on her way home via PTAR.

## 2020-11-14 NOTE — TOC Progression Note (Signed)
Transition of Care [facility]) - Progression Note    Patient Details  Name: Andrea Yu MRN: [id number] Date of Birth: September 24, 1940  Transition of Care [facility]) CM/SW [place], [place] Phone Number: 11/14/2020, 1:10 PM  Clinical Narrative:     Patient met with patient at bedside. Patient expressed she wants to go home. She continue to decline SNF.   CSW spoke with patient's daughter, [name]- she is agrees with the patient returning home. She does not believe she needs hospital bed at this time.   Medical team updated   TOC will continue to follow and assist with discharge planning.  Thurmond Butts, MSW, LCSW Clinical Social Worker    Expected Discharge Plan: [place] Barriers to Discharge: Barriers Resolved  Expected Discharge Plan and Services Expected Discharge Plan: [place] In-house Referral: Clinical Social Work Discharge Planning Services: CM Consult Post Acute Care Choice: [place] arrangements for the past 2 months: Single Family Home Expected Discharge Date: 11/11/20               DME Arranged: N/A DME Agency: NA       HH Arranged: PT, OT HH Agency: [place] Date HH Agency Contacted: 11/11/20 Time HH Agency Contacted: 1300 Representative spoke with at [place]: Amy   Social Determinants of Health ([place]) Interventions    Readmission Risk Interventions Readmission Risk Prevention Plan 11/11/2020  Transportation Screening Complete  PCP or Specialist Appt within 3-5 Days Not Complete  Not Complete comments pt to schedule within a week  [place] or [place] Complete  Social Work Consult for [place] Planning/Counseling Complete  Palliative Care Screening Not Applicable  Medication Review Press photographer) Complete  Some recent data might be hidden

## 2020-11-14 NOTE — Care Management Important Message (Signed)
Important Message  Patient Details  Name: Andrea Yu MRN: [id number] Date of Birth: [birth year]   Medicare Important Message Given:  Yes     Renie Ora 11/14/2020, 10:53 AM

## 2020-11-14 NOTE — TOC Transition Note (Signed)
Transition of Care (TOC) - CM/SW Discharge Note Donn Pierini RN, BSN Transitions of Care Unit 4E- RN Case Manager See Treatment Team for direct phone #    Patient Details  Name: Andrea Yu MRN: [id number] Date of Birth: 1940/12/29  Transition of Care [facility]) CM/SW Contact:  Darrold Span, RN Phone Number: 11/14/2020, 1:47 PM   Clinical Narrative:    Pt stable for transition home today, per CSW daughter agreeable to transition home w/ [facility] and does not want to pursue SNF at this time. Also declines hospital bed at this time.  Spoke with Amy at [facility] for HHPT/OT needs- they are still able to accept referral and will f/u with pt/daughter for start of care.   Daughter to transport home.    Final next level of care: Home w Home Health Services Barriers to Discharge: Barriers Resolved   Patient Goals and CMS Choice Patient states their goals for this hospitalization and ongoing recovery are:: return home- declining SNF CMS Medicare.gov Compare Post Acute Care list provided to:: Patient Represenative (must comment) (daughter) Choice offered to / list presented to : Adult Children  Discharge Placement               Home w/ [facility]        Discharge Plan and Services In-house Referral: Clinical Social Work Discharge Planning Services: CM Consult Post Acute Care Choice: Home Health          DME Arranged: N/A DME Agency: NA       HH Arranged: PT, OT HH Agency: [facility] Date HH Agency Contacted: 11/11/20 Time HH Agency Contacted: 1300 Representative spoke with at [facility] Agency: Amy  Social Determinants of Health (SDOH) Interventions     Readmission Risk Interventions Readmission Risk Prevention Plan 11/11/2020  Transportation Screening Complete  PCP or Specialist Appt within 3-5 Days Not Complete  Not Complete comments pt to schedule within a week  HRI or Home Care Consult Complete  Social Work Consult for Recovery Care Planning/Counseling Complete   Palliative Care Screening Not Applicable  Medication Review Oceanographer) Complete  Some recent data might be hidden

## 2020-11-21 ENCOUNTER — Ambulatory Visit: Payer: Medicare Other | Admitting: Podiatry

## 2020-12-05 LAB — CULTURE, BLOOD (ROUTINE X 2): Special Requests: ADEQUATE

## 2021-01-02 ENCOUNTER — Ambulatory Visit: Payer: Medicare Other | Admitting: Cardiovascular Disease

## 2021-01-03 ENCOUNTER — Other Ambulatory Visit: Payer: Self-pay | Admitting: Cardiovascular Disease

## 2021-05-01 ENCOUNTER — Ambulatory Visit: Payer: Medicare Other | Admitting: Cardiovascular Disease

## 2021-05-15 ENCOUNTER — Other Ambulatory Visit: Payer: Self-pay | Admitting: Cardiovascular Disease

## 2021-05-20 ENCOUNTER — Other Ambulatory Visit: Payer: Self-pay | Admitting: Cardiovascular Disease

## 2021-05-27 ENCOUNTER — Other Ambulatory Visit: Payer: Self-pay | Admitting: Cardiovascular Disease

## 2021-05-28 ENCOUNTER — Ambulatory Visit (INDEPENDENT_AMBULATORY_CARE_PROVIDER_SITE_OTHER): Payer: Medicare Other | Admitting: Cardiovascular Disease

## 2021-05-28 ENCOUNTER — Encounter: Payer: Self-pay | Admitting: Cardiovascular Disease

## 2021-05-28 VITALS — BP 114/78 | HR 71 | Ht 59.0 in | Wt 159.2 lb

## 2021-05-28 DIAGNOSIS — I10 Essential (primary) hypertension: Secondary | ICD-10-CM

## 2021-05-28 DIAGNOSIS — I4821 Permanent atrial fibrillation: Secondary | ICD-10-CM

## 2021-05-28 DIAGNOSIS — L4050 Arthropathic psoriasis, unspecified: Secondary | ICD-10-CM

## 2021-05-28 DIAGNOSIS — E785 Hyperlipidemia, unspecified: Secondary | ICD-10-CM

## 2021-05-28 NOTE — Patient Instructions (Signed)
Medication Instructions:  ?No changes ?*If you need a refill on your cardiac medications before your next appointment, please call your pharmacy* ? ? ?Lab Work: ?None ordered ?If you have labs (blood work) drawn today and your tests are completely normal, you will receive your results only by: ?MyChart Message (if you have MyChart) OR ?A paper copy in the mail ?If you have any lab test that is abnormal or we need to change your treatment, we will call you to review the results. ? ? ?Testing/Procedures: ?None ordered ? ? ?Follow-Up: ?At [facility], you and your health needs are our priority.  As part of our continuing mission to provide you with exceptional heart care, we have created designated Provider Care Teams.  These Care Teams include your primary Cardiologist (physician) and Advanced Practice Providers (APPs -  Physician Assistants and Nurse Practitioners) who all work together to provide you with the care you need, when you need it. ? ?We recommend signing up for the patient portal called "MyChart".  Sign up information is provided on this After Visit Summary.  MyChart is used to connect with patients for Virtual Visits (Telemedicine).  Patients are able to view lab/test results, encounter notes, upcoming appointments, etc.  Non-urgent messages can be sent to your provider as well.   ?To learn more about what you can do with MyChart, go to [email or web address].   ? ?Your next appointment:   ?12 month(s) ? ?The format for your next appointment:   ?In Person ? ?Provider:   ?Mihai Croitoru, MD { ? ? ?Important Information About [place] ? ? ? ? ? ? ?

## 2021-05-28 NOTE — Progress Notes (Signed)
? ?Cardiology office note  ? ?Date:  05/30/2021  ? ?ID:  KENDEL PESNELL, DOB 04/04/1940, MRN [id number] ? ?PCP:  Garlan Fillers, MD  ?Cardiologist:  Thurmon Fair, MD  ?Electrophysiologist:  None  ? ?Chief Complaint:  AFib follow up ? ?History of Present Illness:   ? ?MYKAELA ARENA is a 81 y.o. female with a history of paroxysmal atrial fibrillation and systemic hypertension, with longstanding problems of psoriatic arthritis, orthostatic hypotension and poor balance due to peripheral neuropathy leading to frequent falls with serious complications including traumatic subarachnoid hemorrhage, chronic stasis ulcers of the buttock and right heel. ? ?She was hospitalized and underwent a TEE for bacteremia in October 2022.  Endocarditis was not seen, but she did have moderate plaque involving the descending thoracic aorta.  Also incidentally found to have a small PFO with mild right to left shunting ? ?She is accompanied today by her daughter.  Abbygael remains very sedentary limited to the electric scooter in her bed.  Thankfully she has not had any new falls or injuries or bleeding problems.  She does still bruise very easily, even though she is only taking aspirin 81 mg daily.  She's lost little bit of weight and is now only mildly obese. ? ?She is unaware of palpitations despite permanent atrial fibrillation.  She has not had syncope and denies shortness of breath or angina at rest.  She does not have orthopnea or PND.  She is severely limited by psoriatic arthritis. ? ?ECG shows rate controlled atrial fibrillation. ? ? ?Past Medical History:  ?Diagnosis Date  ? A-fib (HCC)   ? "just after knee 2nd OR" (11/07/2012)  ? Anemia   ? Arthritis   ? RA , SACROTIC ARTHRITIS  ? Complication of anesthesia   ? found to be in afib when she woke up  ? Dysrhythmia   ? GERD (gastroesophageal reflux disease)   ? Head injury, closed, with concussion   ? History of blood transfusion   ? "after 1 of my knee ORs and today" (11/07/2012)  ?  Hypertension   ? Peripheral neuropathy   ? "from back OR in 2005; in hands and feet" (11/07/2012)  ? Psoriatic arthritis (HCC)   ? on Prednisone Rx  ? ?Past Surgical History:  ?Procedure Laterality Date  ? ANKLE FUSION Right 01/07/2016  ? Procedure: Right Tibiocalcaneal Fusion;  Surgeon: Nadara Mustard, MD;  Location: [facility] OR;  Service: Orthopedics;  Laterality: Right;  ? BACK SURGERY    ? BUBBLE STUDY  11/07/2020  ? Procedure: BUBBLE STUDY;  Surgeon: Lewayne Bunting, MD;  Location: [facility] ENDOSCOPY;  Service: Cardiovascular;;  ? CATARACT EXTRACTION W/ INTRAOCULAR LENS  IMPLANT, BILATERAL Bilateral 2007  ? CHOLECYSTECTOMY  2000's  ? COLONOSCOPY N/A 11/10/2012  ? Procedure: COLONOSCOPY;  Surgeon: Theda Belfast, MD;  Location: [facility] ENDOSCOPY;  Service: Endoscopy;  Laterality: N/A;  ? ESOPHAGOGASTRODUODENOSCOPY N/A 11/10/2012  ? Procedure: ESOPHAGOGASTRODUODENOSCOPY (EGD);  Surgeon: Theda Belfast, MD;  Location: [facility] ENDOSCOPY;  Service: Endoscopy;  Laterality: N/A;  ? EYE SURGERY    ? JOINT REPLACEMENT    ? POSTERIOR LUMBAR FUSION  2005  ? "got a rod and 4 pins in there" (11/07/2012)  ? REPLACEMENT TOTAL KNEE BILATERAL Bilateral 2007  ? TEE WITHOUT CARDIOVERSION N/A 11/07/2020  ? Procedure: TRANSESOPHAGEAL ECHOCARDIOGRAM (TEE);  Surgeon: Lewayne Bunting, MD;  Location: [facility] ENDOSCOPY;  Service: Cardiovascular;  Laterality: N/A;  ? TRANSTHORACIC ECHOCARDIOGRAM  05/24/2006  ? TUBAL LIGATION  1974  ?  VAGINAL HYSTERECTOMY  1983  ?  ? ?Current Meds  ?Medication Sig  ? acetaminophen (TYLENOL) 325 MG tablet Take 2 tablets (650 mg total) by mouth every 4 (four) hours as needed. (Patient taking differently: Take 325 mg by mouth every 6 (six) hours as needed (for pain.).)  ? albuterol (VENTOLIN HFA) 108 (90 Base) MCG/ACT inhaler Inhale 2 puffs into the lungs every 4 (four) hours as needed for wheezing or shortness of breath.  ? Apremilast (OTEZLA) 30 MG TABS Take 30 mg by mouth 2 (two) times daily.  ? aspirin EC 81 MG tablet Take 81 mg  by mouth every evening.  ? Biotin 5000 MCG CAPS Take 5,000 mcg by mouth every evening.  ? Coenzyme Q10 (COQ10) 100 MG CAPS Take 100 mg by mouth daily.  ? diclofenac Sodium (VOLTAREN) 1 % GEL Apply 4 g topically 2 (two) times daily as needed (pain).  ? diltiazem (CARDIZEM CD) 120 MG 24 hr capsule Take 1 capsule (120 mg total) by mouth daily. KEEP OV.  ? DULoxetine (CYMBALTA) 30 MG capsule Take 90 mg by mouth daily. Take with  for a total daily dose of   ? DULoxetine (CYMBALTA) 60 MG capsule Take 90 mg by mouth daily. Take with  for a total daily dose of   ? feeding supplement (ENSURE ENLIVE / ENSURE PLUS) LIQD Take 237 mLs by mouth 2 (two) times daily between meals.  ? Flaxseed, Linseed, (FLAX SEED OIL PO) Take 1 capsule by mouth daily.  ? HYDROcodone-acetaminophen (NORCO/VICODIN) 5-325 MG tablet Take 3 tablets by mouth in the morning, at noon, and at bedtime.  ? hydroxypropyl methylcellulose / hypromellose (ISOPTO TEARS / GONIOVISC) 2.5 % ophthalmic solution Place 1 drop into both eyes 3 (three) times daily as needed for dry eyes.  ? Lidocaine HCl 4 % CREA Apply 1 application topically 2 (two) times daily as needed (pain). Also uses roll on lidocaine 4%  ? Magnesium 250 MG TABS Take 250 mg by mouth at bedtime.  ? methocarbamol (ROBAXIN) 500 MG tablet Take 500 mg by mouth in the morning, at noon, and at bedtime.  ? metoprolol succinate (TOPROL-XL) 25 MG 24 hr tablet Take 1 tablet (25 mg total) by mouth daily for 30 doses. For total of 75 mg  ? metoprolol succinate (TOPROL-XL) 50 MG 24 hr tablet TAKE 1 TABLET BY MOUTH  DAILY WITH OR IMMEDIATELY  FOLLOWING A MEAL  ? Multiple Vitamin (MULTIVITAMIN WITH MINERALS) TABS Take 1 tablet by mouth daily.  ? nystatin cream (MYCOSTATIN) Apply 1 application topically daily as needed (yeast).  ? Omega-3 Fatty Acids (FISH OIL) 1000 MG CAPS Take 1,000 mg by mouth daily.  ? omeprazole (PRILOSEC) 20 MG capsule Take 20 mg by mouth every evening.  ? potassium chloride SA  (K-DUR,KLOR-CON) 20 MEQ tablet Take 20 mEq by mouth 2 (two) times daily.   ? predniSONE (DELTASONE) 1 MG tablet Take 4 mg by mouth daily.  ? senna-docusate (SENOKOT-S) 8.6-50 MG tablet Take 1 tablet by mouth at bedtime as needed for mild constipation.  ? Vitamin D, Ergocalciferol, (DRISDOL) 50000 UNITS CAPS Take 50,000 Units by mouth every Tuesday.   ?  ? ?Allergies:   Penicillins, Prolia [denosumab], Ceftriaxone, Ciprofloxacin hcl, Fosamax [alendronate sodium], Latex, Lisinopril, and Tubersol [tuberculin ppd]  ? ?Social History  ? ?Tobacco Use  ? Smoking status: Former  ?  Packs/day: 0.50  ?  Years: 30.00  ?  Pack years: 15.00  ?  Types: Cigarettes  ?  Quit  date: 08/09/1981  ?  Years since quitting: 39.8  ? Smokeless tobacco: Never  ?Substance Use Topics  ? Alcohol use: No  ? Drug use: No  ?  ? ?Family Hx: ?The patient's family history includes Aneurysm (age of onset: 5333) in her son; Heart attack in her father; Liver disease in her mother. ? ?ROS:   ?Please see the history of present illness.    ?All other systems are reviewed and are negative.  ? ?Prior CV studies:   ?The following studies were reviewed today: ?Transesophageal echocardiogram 11/08/2018 2010 ? 1. No vegetations noted.  ? 2. Left ventricular ejection fraction, by estimation, is 55 to 60%. The left ventricle has normal function. The left ventricle has no regional wall motion abnormalities.  ? 3. Right ventricular systolic function is normal. The right ventricular size is normal.  ? 4. Left atrial size was moderately dilated. No left atrial/left atrial appendage thrombus was detected.  ? 5. The mitral valve is normal in structure. Trivial mitral valve  ?regurgitation.  ? 6. The aortic valve is tricuspid. Aortic valve regurgitation is mild.  ? 7. 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  ? 8. Evidence of atrial level shunting detected by color flow Doppler. Agitated saline contrast bubble study was positive with shunting  observed within 3-6 cardiac cycles suggestive of interatrial shunt. There is a small patent foramen ovale.  ? ? ?Lower extremity arterial Dopplers performed September 05, 2015 showed bilateral diffuse SFA d

## 2021-08-15 ENCOUNTER — Other Ambulatory Visit: Payer: Self-pay | Admitting: Cardiovascular Disease

## 2021-11-24 ENCOUNTER — Encounter (HOSPITAL_BASED_OUTPATIENT_CLINIC_OR_DEPARTMENT_OTHER): Payer: Medicare Other | Attending: General Surgery | Admitting: General Surgery

## 2021-11-24 DIAGNOSIS — M1990 Unspecified osteoarthritis, unspecified site: Secondary | ICD-10-CM | POA: Diagnosis not present

## 2021-11-24 DIAGNOSIS — L89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, stage 3: Secondary | ICD-10-CM | POA: Diagnosis present

## 2021-11-24 DIAGNOSIS — G629 Polyneuropathy, unspecified: Secondary | ICD-10-CM | POA: Insufficient documentation

## 2021-11-24 DIAGNOSIS — I10 Essential (primary) hypertension: Secondary | ICD-10-CM | POA: Insufficient documentation

## 2021-11-24 DIAGNOSIS — E2740 Unspecified adrenocortical insufficiency: Secondary | ICD-10-CM | POA: Diagnosis not present

## 2021-11-24 DIAGNOSIS — L0231 Cutaneous abscess of buttock: Secondary | ICD-10-CM | POA: Insufficient documentation

## 2021-11-24 DIAGNOSIS — Z7952 Long term (current) use of systemic steroids: Secondary | ICD-10-CM | POA: Diagnosis not present

## 2021-11-24 DIAGNOSIS — I4891 Unspecified atrial fibrillation: Secondary | ICD-10-CM | POA: Diagnosis not present

## 2021-11-24 NOTE — Progress Notes (Signed)
Andrea Yu, SLIGHT Yu ([id number]) [id number] Page 1 of 11 Visit Report for 11/24/2021 Chief Complaint Document Details Patient Name: Date of Service: Andrea Yu 11/24/2021 9:00 [name] Medical Record Number: [id number] Patient Account Number: [id number] Date of Birth/Sex: Treating RN: Apr 16, 1940 (81 y.o. Elam Dutch Primary Care Provider: Leanna Battles Other Clinician: Referring Provider: Treating Provider/Extender: Tamala Fothergill in Treatment: 0 Information Obtained from: Patient Chief Complaint Patient is at the clinic for treatment of an open pressure ulcer on her right medial ankle, and Yu large abscess on her right buttock Electronic Signature(s) Signed: 11/24/2021 11:06:30 AM By: Fredirick Maudlin MD FACS Previous Signature: 11/24/2021 11:03:53 AM Version By: Fredirick Maudlin MD FACS Entered By: Fredirick Maudlin on 11/24/2021 11:06:30 -------------------------------------------------------------------------------- Debridement Details Patient Name: Date of Service: Andrea Pouch Yu. 11/24/2021 9:00 [name] Medical Record Number: [id number] Patient Account Number: [id number] Date of Birth/Sex: Treating RN: 05-08-1940 (81 y.o. Elam Dutch Primary Care Provider: Leanna Battles Other Clinician: Referring Provider: Treating Provider/Extender: Tamala Fothergill in Treatment: 0 Debridement Performed for Assessment: Wound #1 Right,Medial Malleolus Performed By: Physician Fredirick Maudlin, MD Debridement Type: Debridement Level of Consciousness (Pre-procedure): Awake and Alert Pre-procedure Verification/Time Out Yes - 10:35 Taken: Start Time: 10:35 Pain Control: Lidocaine 4% T opical Solution T Area Debrided (L x W): otal 1.4 (cm) x 1.4 (cm) = 1.96 (cm) Tissue and other material debrided: Non-Viable, Slough, Slough Level: Non-Viable Tissue Debridement Description: Selective/Open Wound Instrument:  Curette Bleeding: Minimum Hemostasis Achieved: Pressure Procedural Pain: 0 Post Procedural Pain: 0 Response to Treatment: Procedure was tolerated well Level of Consciousness (Post- Awake and Alert procedure): Post Debridement Measurements of Total Wound Length: (cm) 1.4 Stage: Category/Stage III Width: (cm) 1.4 Depth: (cm) 0.1 Volume: (cm) 0.154 Character of Wound/Ulcer Post Debridement: Improved Post Procedure Diagnosis Andrea Yu ([id number]) [id number] Page 2 of 11 Same as Pre-procedure Notes Scribed by Aida Raider for Dr. Celine Ahr Electronic Signature(s) Signed: 11/24/2021 2:03:45 PM By: Fredirick Maudlin MD FACS Signed: 11/24/2021 5:36:35 PM By: Baruch Gouty RN, BSN Previous Signature: 11/24/2021 11:15:04 AM Version By: Fredirick Maudlin MD FACS Entered By: Baruch Gouty on 11/24/2021 12:43:38 -------------------------------------------------------------------------------- HPI Details Patient Name: Date of Service: Andrea Pouch Yu. 11/24/2021 9:00 [name] Medical Record Number: [id number] Patient Account Number: [id number] Date of Birth/Sex: Treating RN: 06/17/1940 (80 y.o. 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 She is not diabetic and quit smoking over 40 years ago. 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 She has developed Yu pressure ulcer on the right medial malleolus. In addition, she apparently has had multiple cutaneous abscesses on her buttocks that have required repeated incision and drainage procedures. She has developed Yu large abscess on her right  buttock that has become painful. She was recently treated with cephalexin by her PCP for urinary tract infection and also received Yu shot of Rocephin for the abscess, but it has not yet been incised or drained. She is nonambulatory but is able to stand to transfer. She does not wear any sort of Prevalon boot. ABI in clinic today was 0.96. On her right medial malleolus, there is Yu circular ulcer. The surface is dry  and fibrotic. There is some periwound erythema, but no malodor or purulent drainage. On her right buttock, there is Yu large fluctuant bulge about the size of an orange. It is also erythematous and tender. Electronic Signature(s) Signed: 11/24/2021 11:08:42 AM By: Fredirick Maudlin MD FACS Previous Signature: 11/24/2021 11:05:52 AM Version By: Fredirick Maudlin MD FACS Entered By: Fredirick Maudlin on 11/24/2021 11:08:42 -------------------------------------------------------------------------------- Incision and Drainage Details Patient Name: Date of Service: Andrea Pouch Yu. 11/24/2021 9:00 [name] Medical Record Number: [id number] Patient Account Number: [id number] Date of Birth/Sex: Treating RN: September 09, 1940 (80 y.o. Elam Dutch Primary Care Provider: Leanna Battles Other Clinician: Referring Provider: Treating Provider/Extender: Tamala Fothergill in Treatment: 0 Incision And Drainage Performed NonWound Condition Other Dermatologic Condition - Right Gluteus for: Performed By: Physician Fredirick Maudlin, MD Incision And Drainage Type: Abscess Location: right gluteus Level of Consciousness (Pre- Awake and Alert procedure): Pre-procedure Verification/Time Out Yes - 10:35 Taken: Pain Control: Lidocaine Injectable Drainage Of: Purulent, Serous Instrument: Blade Bleeding: Minimum ARNELLA, PRALLE Yu ([id number]) [id number] Page 3 of 11 Hemostasis Achieved: Pressure Culture Sent: Swab Procedural Pain: 3 Post Procedural Pain:  2 Response to Treatment: Procedure was tolerated well Level of Consciousness (Post- Awake and Alert procedure): Post Procedure Diagnosis Same as Pre-procedure Notes Scribed by Baruch Gouty, RN for Dr. Celine Ahr Electronic Signature(s) Signed: 11/24/2021 2:03:45 PM By: Fredirick Maudlin MD FACS Signed: 11/24/2021 5:36:35 PM By: Baruch Gouty RN, BSN Previous Signature: 11/24/2021 11:15:04 AM Version By: Fredirick Maudlin MD FACS Entered By: Baruch Gouty on 11/24/2021 12:44:38 -------------------------------------------------------------------------------- Physical Exam Details Patient Name: Date of Service: Andrea Pouch Yu. 11/24/2021 9:00 [name] Medical Record Number: [id number] Patient Account Number: [id number] Date of Birth/Sex: Treating RN: [birth year] (80 y.o. Elam Dutch Primary Care Provider: Leanna Battles Other Clinician: Referring Provider: Treating Provider/Extender: Tamala Fothergill in Treatment: 0 Constitutional . . . . Chronically ill-appearing, but in no acute distress.Marland Kitchen Respiratory Normal work of breathing on room air.. Notes 11/24/2021: On her right medial malleolus, there is Yu circular ulcer. The surface is dry and fibrotic. There is some periwound erythema, but no malodor or purulent drainage. On her right buttock, there is Yu large fluctuant bulge about the size of an orange. It is also erythematous and tender. Electronic Signature(s) Signed: 11/24/2021 11:10:06 AM By: Fredirick Maudlin MD FACS Entered By: Fredirick Maudlin on 11/24/2021 11:10:06 -------------------------------------------------------------------------------- Physician Orders Details Patient Name: Date of Service: Andrea Pouch Yu. 11/24/2021 9:00 [name] Medical Record Number: [id number] Patient Account Number: [id number] Date of Birth/Sex: Treating RN: September 07, 1940 (81 y.o. 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 - Dr. Celine Ahr RM 1 Return Yu Anesthetic (In clinic) Topical Lidocaine 4% applied to wound bed (In clinic) Xylocaine 1% injection applied to wound bed Bathing/ Shower/ Hygiene May shower and wash wound with soap and water. Off-Loading Multipodus Splint to: - prevalon boot right foot Turn and reposition every 2 hours [place] to [place] for wound care. May utilize formulary equivalent dressing for wound treatment orders unless otherwise specified. Dressing changes to  be completed by Home Health on Monday / Wednesday / Friday except when patient has scheduled visit at [facility]. Wound Treatment Wound #1 - Malleolus Wound Laterality: Right, Medial Cleanser: Normal Saline (DME) (Generic) Every Other Day/30 Days Discharge Instructions: Cleanse the wound with Normal Saline prior to applying Yu clean dressing using gauze sponges, not tissue or cotton balls. Cleanser: Byram Ancillary Kit - 15 Day Supply (DME) (Generic) Every Other Day/30 Days Discharge Instructions: Use supplies as instructed; Kit contains: (15) Saline Bullets; (15) 3x3 Gauze; 15 pr Gloves Topical: Skintegrity Hydrogel 4 (oz) (DME) (Generic) Every Other Day/30 Days Discharge Instructions: Apply hydrogel as directed Prim Dressing: Promogran Prisma Matrix, 4.34 (sq in) (silver collagen) (DME) (Dispense As Written) Every Other Day/30 Days ary Discharge Instructions: Moisten collagen with saline or  hydrogel Secondary Dressing: Zetuvit Plus Silicone Border Dressing 5x5 (in/in) (DME) (Dispense As Written) Every Other Day/30 Days Discharge Instructions: Apply silicone border over primary dressing as directed. Wound #2 - Gluteus Wound Laterality: Right Cleanser: Normal Saline 1 x Per Day/30 Days Discharge Instructions: Cleanse the wound with Normal Saline prior to applying Yu clean dressing using gauze sponges, not tissue or cotton balls. Prim Dressing: Dakin's Solution 0.25%, 16 (oz) (DME) (Generic) 1 x Per Day/30 Days ary Discharge Instructions: Moisten gauze with Dakin's solution and pack into wound Secondary Dressing: Zetuvit Plus Silicone Border Dressing 5x5 (in/in) (DME) (Dispense As Written) 1 x Per Day/30 Days Discharge Instructions: Apply silicone border over primary dressing as directed. Laboratory naerobe culture (MICRO) Bacteria identified in Unspecified specimen by Yu LOINC Code: 267-1 Convenience Name: Anaerobic culture Patient Medications llergies: penicillin, latex, Prolia, ceftriaxone, ciprofloxacin, Fosamax, lisinopril, tuberculin,PPD,multi-puncture Yu Notifications Medication Indication Start End prior to debridement 11/24/2021 lidocaine DOSE topical 4 % cream - cream topical 11/24/2021 Dakin's Solution DOSE miscellaneous 0.25 % solution - Moisten gauze with Dakin solution for packing gluteal abscess cavity 11/24/2021 Bactrim DS DOSE oral 800 mg-160 mg tablet - 1 tab p.o. twice daily x10 days Electronic Signature(s) Signed: 11/24/2021 2:03:45 PM By: Fredirick Maudlin MD FACS Signed: 11/24/2021 5:36:35 PM By: Baruch Gouty RN, BSN [place], Signed: ([id number]) Brandice Yu 11/24/2021 5:36:35 PM By: Baruch Gouty RN, BSN [phone].pdf Page 5 of 11 Previous Signature: 11/24/2021 11:15:04 AM Version By: Fredirick Maudlin MD FACS Previous Signature: 11/24/2021 11:12:02 AM Version By: Fredirick Maudlin MD FACS Entered By: Baruch Gouty on  11/24/2021 12:45:25 -------------------------------------------------------------------------------- Problem List Details Patient Name: Date of Service: Andrea Pouch Yu. 11/24/2021 9:00 [name] Medical Record Number: [id number] Patient Account Number: [id number] Date of Birth/Sex: Treating RN: 12/01/1940 (80 y.o. 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, stage 3 11/24/2021 No Yes L02.31 Cutaneous abscess of buttock 11/24/2021 No Yes E27.40 Unspecified adrenocortical insufficiency 11/24/2021 No Yes Z79.52 Long term (current) use of systemic steroids 11/24/2021 No Yes I10 Essential (primary) hypertension 11/24/2021 No Yes Inactive Problems Resolved Problems Electronic Signature(s) Signed: 11/24/2021 11:02:34 AM By: Fredirick Maudlin MD FACS Previous Signature: 11/24/2021 9:42:15 AM Version By: Fredirick Maudlin MD FACS Entered By: Fredirick Maudlin on 11/24/2021 11:02:33 -------------------------------------------------------------------------------- Progress Note Details Patient Name: Date of Service: Andrea Pouch Yu. 11/24/2021 9:00 [name] Medical Record Number: [id number] Patient Account Number: [id number] Date of Birth/Sex: Treating RN: [birth year] (81 y.o. 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 She is not diabetic and quit smoking over 40 years ago. 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 She has developed Yu pressure ulcer on the right medial malleolus. In addition, she apparently has had multiple cutaneous abscesses on her buttocks that have required repeated incision and drainage procedures. She has developed Yu large abscess on her right buttock that has become painful. She was recently treated with cephalexin by her PCP for urinary tract infection and also received Yu shot of Rocephin for the abscess, but it has not yet been incised or drained. She is nonambulatory but is able to stand to transfer. She does not wear any sort of Prevalon boot. ABI in clinic today was 0.96. On her right medial malleolus, there is Yu circular ulcer. The surface is dry and fibrotic. There is some periwound erythema, but no malodor or purulent drainage. On her right buttock, there is Yu large fluctuant bulge about the size of an orange. It is also erythematous and tender. Patient History Information obtained from Patient, Caregiver, Chart. Allergies penicillin (Severity: Severe, Reaction: anaphylaxis), Prolia, ceftriaxone, ciprofloxacin (Reaction: hallucinations), Fosamax, latex (Severity: Severe, Reaction: swelling), lisinopril (Reaction: cough), tuberculin,PPD,multi-puncture Family History Cancer - Father,Maternal Grandparents,Siblings,Child, Heart Disease - Father, Hypertension - Father, Thyroid Problems - Mother, No family history of Diabetes, Hereditary Spherocytosis, Kidney Disease, Lung Disease, Seizures, Stroke, Tuberculosis. Social History Former smoker - quit 24 yr ago, Marital Status - Widowed, Alcohol Use - Never, Drug Use - No History, Caffeine Use - Daily - tea, soda. Medical  History Eyes Patient has history of Cataracts - bil extracted Denies history of Glaucoma, Optic Neuritis Cardiovascular Patient has history of Arrhythmia - afib, Hypertension Endocrine Denies history of Type I Diabetes, Type II Diabetes Genitourinary Denies history of End Stage Renal Disease Integumentary (Skin) Denies history of History of Burn Musculoskeletal Patient has history of Osteoarthritis, Osteomyelitis - pelvic Neurologic Patient has history of Neuropathy Oncologic Denies history of Received Chemotherapy, Received Radiation Psychiatric Patient has history of Confinement Anxiety Denies history of Anorexia/bulimia Hospitalization/Surgery History - TEE. - right ankle fusion. - bil knee replacements. - bil cataract extractions. - posterior lumbar fusion. - vaginal hysterectomy. - cholecystectomy. Medical Yu Surgical History Notes nd Constitutional Symptoms (General Health) morbid obesity Ear/Nose/Mouth/Throat hard of hearing Gastrointestinal GERD Endocrine adrenal insufficiency Musculoskeletal psoriatic arthritis, thoracic discitis, displaced spiral fx right tibia Neurologic hx SAH Review of Systems (ROS) Constitutional Symptoms (General Health) Denies complaints or symptoms of Fatigue, Fever, Chills, Marked Weight Change. Eyes Complains or has symptoms of Glasses / Contacts - reading. Ear/Nose/Mouth/Throat Denies complaints or symptoms of Chronic sinus problems or rhinitis. Respiratory Denies complaints or symptoms of Chronic or frequent coughs, Shortness of Breath. Cardiovascular Denies complaints or symptoms of Chest pain. Gastrointestinal Denies complaints or symptoms of Frequent diarrhea, Nausea, Vomiting. Endocrine Denies complaints or symptoms of Heat/cold intolerance. Genitourinary Denies complaints or symptoms of Frequent urination. Integumentary (Skin) CHERYLANNE, ARDELEAN Yu ([id number]) [id number] Page 7 of 11 Complains or  has symptoms of Wounds - right ankle. Musculoskeletal Complains  or has symptoms of Muscle Weakness. Denies complaints or symptoms of Muscle Pain. Neurologic Denies complaints or symptoms of Numbness/parasthesias. Psychiatric Complains or has symptoms of Claustrophobia. Objective Constitutional Chronically ill-appearing, but in no acute distress.. Vitals Time Taken: 9:46 AM, Height: 59 in, Source: Stated, Weight: 155 lbs, Source: Stated, BMI: 31.3, Temperature: 98.2 F, Pulse: 83 bpm, Respiratory Rate: 18 breaths/min, Blood Pressure: 125/72 mmHg. Respiratory Normal work of breathing on room air.. General Notes: 11/24/2021: On her right medial malleolus, there is Yu circular ulcer. The surface is dry and fibrotic. There is some periwound erythema, but no malodor or purulent drainage. On her right buttock, there is Yu large fluctuant bulge about the size of an orange. It is also erythematous and tender. Integumentary (Hair, Skin) Wound #1 status is Open. Original cause of wound was Pressure Injury. The date acquired was: 11/16/2021. The wound is located on the Right,Medial Malleolus. The wound measures 1.4cm length x 1.4cm width x 0.1cm depth; 1.539cm^2 area and 0.154cm^3 volume. There is Fat Layer (Subcutaneous Tissue) exposed. There is no tunneling or undermining noted. There is Yu medium amount of serosanguineous drainage noted. The wound margin is distinct with the outline attached to the wound base. There is small (1-33%) red granulation within the wound bed. There is Yu large (67-100%) amount of necrotic tissue within the wound bed including Adherent Slough. The periwound skin appearance had no abnormalities noted for texture. The periwound skin appearance had no abnormalities noted for moisture. The periwound skin appearance exhibited: Erythema. The surrounding wound skin color is noted with erythema. Erythema is measured at 1 cm. Periwound temperature was noted as No Abnormality. Wound #2  status is Open. Original cause of wound was Bump. The date acquired was: 09/28/2021. The wound is located on the Right Gluteus. The wound measures 1.4cm length x 9.5cm width x 3cm depth; 10.446cm^2 area and 31.337cm^3 volume. There is Fat Layer (Subcutaneous Tissue) exposed. There is no tunneling noted, however, there is undermining starting at :00 and ending at :00. There is Yu large amount of serosanguineous drainage noted. The wound margin is well defined and not attached to the wound base. There is large (67-100%) red granulation within the wound bed. There is no necrotic tissue within the wound bed. The periwound skin appearance had no abnormalities noted for texture. The periwound skin appearance had no abnormalities noted for moisture. The periwound skin appearance exhibited: Erythema. The surrounding wound skin color is noted with erythema. Periwound temperature was noted as No Abnormality. The periwound has tenderness on palpation. Assessment Active Problems ICD-10 Pressure ulcer of right ankle, stage 3 Cutaneous abscess of buttock Unspecified adrenocortical insufficiency Long term (current) use of systemic steroids Essential (primary) hypertension Procedures Wound #1 Pre-procedure diagnosis of Wound #1 is Yu Pressure Ulcer located on the Right,Medial Malleolus . There was Yu Selective/Open Wound Non-Viable Tissue Debridement with Yu total area of 1.96 sq cm performed by Fredirick Maudlin, MD. With the following instrument(s): Curette to remove Non-Viable tissue/material. Material removed includes [facility] after achieving pain control using Lidocaine 4% Topical Solution. No specimens were taken. Yu time out was conducted at 10:35, prior to the start of the procedure. Yu Minimum amount of bleeding was controlled with Pressure. The procedure was tolerated well with Yu pain level of 0 throughout and Yu pain level of 0 following the procedure. Post Debridement Measurements: 1.4cm length x 1.4cm width x  0.1cm depth; 0.154cm^3 volume. Post debridement Stage noted as Category/Stage III. Character of Wound/Ulcer Post Debridement is improved. Post  procedure Diagnosis Wound #1: Same as Pre-Procedure General Notes: Scribed by Aida Raider for Dr. Celine Ahr. Abscess incision and drainage was provided by Fredirick Maudlin, MD. The skin was cleansed and prepped with anti-septic followed by pain control using Lidocaine Injectable. An incision was made in the right gluteus with the following instrument(s): Blade. There was an immediate release of Purulent and Serous fluid. Yu Minimum amount of bleeding was controlled with Pressure. Yu time out was conducted prior to the start of the procedure. Swab culture was sent. The procedure was tolerated well with Yu pain level of 3 throughout and Yu pain level of 2 following the procedure. Patientoos Level of Consciousness post procedure was recorded as Awake and Alert. Post procedure Diagnosis Wound #: Same as Pre-Procedure General Notes: Scribed by Baruch Gouty, RN for Dr. Celine Ahr. ELERI, RUBEN Yu ([id number]) [id number] Page 8 of 11 Plan Follow-up Appointments: Return Appointment in 1 week. - Dr. Celine Ahr RM 1 Anesthetic: (In clinic) Topical Lidocaine 4% applied to wound bed (In clinic) Xylocaine 1% injection applied to wound bed Bathing/ Shower/ Hygiene: May shower and wash wound with soap and water. Off-Loading: Multipodus Splint to: - prevalon boot right foot Turn and reposition every 2 hours Home Health: Admit to [facility] for wound care. May utilize formulary equivalent dressing for wound treatment orders unless otherwise specified. Dressing changes to be completed by [facility] on Monday / Wednesday / Friday except when patient has scheduled visit at [facility]. Laboratory ordered were: Anaerobic culture The following medication(s) was prescribed: lidocaine topical 4 % cream cream topical for prior to debridement  was prescribed at facility Dakin's Solution miscellaneous 0.25 % solution Moisten gauze with Dakin solution for packing gluteal abscess cavity starting 11/24/2021 Bactrim DS oral 800 mg-160 mg tablet 1 tab p.o. twice daily x10 days starting 11/24/2021 WOUND #1: - Malleolus Wound Laterality: Right, Medial Cleanser: Normal Saline (DME) (Generic) Every Other Day/30 Days Discharge Instructions: Cleanse the wound with Normal Saline prior to applying Yu clean dressing using gauze sponges, not tissue or cotton balls. Cleanser: Byram Ancillary Kit - 15 Day Supply (DME) (Generic) Every Other Day/30 Days Discharge Instructions: Use supplies as instructed; Kit contains: (15) Saline Bullets; (15) 3x3 Gauze; 15 pr Gloves Topical: Skintegrity Hydrogel 4 (oz) (DME) (Generic) Every Other Day/30 Days Discharge Instructions: Apply hydrogel as directed Prim Dressing: Promogran Prisma Matrix, 4.34 (sq in) (silver collagen) (DME) (Dispense As Written) Every Other Day/30 Days ary Discharge Instructions: Moisten collagen with saline or hydrogel Secondary Dressing: Zetuvit Plus Silicone Border Dressing 5x5 (in/in) (DME) (Dispense As Written) Every Other Day/30 Days Discharge Instructions: Apply silicone border over primary dressing as directed. WOUND #2: - Gluteus Wound Laterality: Right Cleanser: Normal Saline 1 x Per Day/30 Days Discharge Instructions: Cleanse the wound with Normal Saline prior to applying Yu clean dressing using gauze sponges, not tissue or cotton balls. Prim Dressing: Dakin's Solution 0.25%, 16 (oz) (DME) (Generic) 1 x Per Day/30 Days ary Discharge Instructions: Moisten gauze with Dakin's solution and pack into wound Secondary Dressing: Zetuvit Plus Silicone Border Dressing 5x5 (in/in) (DME) (Dispense As Written) 1 x Per Day/30 Days Discharge Instructions: Apply silicone border over primary dressing as directed. 11/24/2021: This is an 81 year old woman who presents to clinic today with 2 separate  problems. On her right medial malleolus, there is Yu circular ulcer. The surface is dry and fibrotic. There is some periwound erythema, but no malodor or purulent drainage. On her right buttock, there is Yu large fluctuant bulge about the  size of an orange. It is also erythematous and tender. I used Yu curette to debride slough off of the right medial ankle wound. We will use hydrogel and collagen to the site to try and improve the moisture balance and encourage ingrowth of normal tissue. We will also provide Yu Prevalon boot to avoid further pressure damage to the site. After obtaining consent, I performed incision and drainage of the large gluteal abscess. Yu culture was taken. The cavity was irrigated thoroughly after all purulent material was expressed. We will pack the wound with wet-to-dry Dakin's dressings. I prescribed Bactrim. I will follow-up the cultures and if I need to change anything based on those results, we will do so and let the patient know. Follow-up in 1 week. Electronic Signature(s) Signed: 11/24/2021 2:03:45 PM By: Fredirick Maudlin MD FACS Signed: 11/24/2021 5:36:35 PM By: Baruch Gouty RN, BSN Previous Signature: 11/24/2021 11:13:45 AM Version By: Fredirick Maudlin MD FACS Entered By: Baruch Gouty on 11/24/2021 12:54:57 -------------------------------------------------------------------------------- HxROS Details Patient Name: Date of Service: Andrea Pouch Yu. 11/24/2021 9:00 [name] Medical Record Number: [id number] Patient Account Number: [id number] Date of Birth/Sex: Treating RN: [birth year] (80 y.o. Elam Dutch Primary Care Provider: Leanna Battles Other Clinician: Referring Provider: Treating Provider/Extender: Tamala Fothergill in Treatment: 0 Information Obtained From KYLEE, UMANA ([id number]) [id number] Page 9 of 11 Patient Caregiver Chart Constitutional Symptoms (General Health) Complaints and  Symptoms: Negative for: Fatigue; Fever; Chills; Marked Weight Change Medical History: Past Medical History Notes: morbid obesity Eyes Complaints and Symptoms: Positive for: Glasses / Contacts - reading Medical History: Positive for: Cataracts - bil extracted Negative for: Glaucoma; Optic Neuritis Ear/Nose/Mouth/Throat Complaints and Symptoms: Negative for: Chronic sinus problems or rhinitis Medical History: Past Medical History Notes: hard of hearing Respiratory Complaints and Symptoms: Negative for: Chronic or frequent coughs; Shortness of Breath Cardiovascular Complaints and Symptoms: Negative for: Chest pain Medical History: Positive for: Arrhythmia - afib; Hypertension Gastrointestinal Complaints and Symptoms: Negative for: Frequent diarrhea; Nausea; Vomiting Medical History: Past Medical History Notes: GERD Endocrine Complaints and Symptoms: Negative for: Heat/cold intolerance Medical History: Negative for: Type I Diabetes; Type II Diabetes Past Medical History Notes: adrenal insufficiency Genitourinary Complaints and Symptoms: Negative for: Frequent urination Medical History: Negative for: End Stage Renal Disease Integumentary (Skin) Complaints and Symptoms: Positive for: Wounds - right ankle Medical History: Negative for: History of Burn Musculoskeletal Complaints and Symptoms: Positive for: Muscle Weakness Negative for: Muscle Pain Medical HistoryADALIS, GATTI ([id number]) [id number] Page 10 of 11 Positive for: Osteoarthritis; Osteomyelitis - pelvic Past Medical History Notes: psoriatic arthritis, thoracic discitis, displaced spiral fx right tibia Neurologic Complaints and Symptoms: Negative for: Numbness/parasthesias Medical History: Positive for: Neuropathy Past Medical History Notes: hx SAH Psychiatric Complaints and Symptoms: Positive for: Claustrophobia Medical History: Positive for: Confinement  Anxiety Negative for: Anorexia/bulimia Hematologic/Lymphatic Immunological Oncologic Medical History: Negative for: Received Chemotherapy; Received Radiation HBO Extended History Items Eyes: Cataracts Immunizations Pneumococcal Vaccine: Received Pneumococcal Vaccination: Yes Received Pneumococcal Vaccination On or After 60th Birthday: Yes Implantable Devices None Hospitalization / Surgery History Type of Hospitalization/Surgery TEE right ankle fusion bil knee replacements bil cataract extractions posterior lumbar fusion vaginal hysterectomy cholecystectomy Family and Social History Cancer: Yes - Father,Maternal Grandparents,Siblings,Child; Diabetes: No; Heart Disease: Yes - Father; Hereditary Spherocytosis: No; Hypertension: Yes - Father; Kidney Disease: No; Lung Disease: No; Seizures: No; Stroke: No; Thyroid Problems: Yes - Mother; Tuberculosis: No; Former smoker - quit 71 yr ago; Marital Status - Widowed; Alcohol Use: Never;  Drug Use: No History; Caffeine Use: Daily - tea, soda; Financial Concerns: No; Food, Clothing or Shelter Needs: No; Support System Lacking: No; Transportation Concerns: No Electronic Signature(s) Signed: 11/24/2021 11:15:04 AM By: Fredirick Maudlin MD FACS Signed: 11/24/2021 5:36:35 PM By: Baruch Gouty RN, BSN Entered By: Baruch Gouty on 11/24/2021 10:03:44 SuperBill Details -------------------------------------------------------------------------------- Annabell Howells ([id number]) [id number] Page 11 of 11 Patient Name: Date of Service: Andrea Yu 11/24/2021 Medical Record Number: [id number] Patient Account Number: [id number] Date of Birth/Sex: Treating RN: Nov 25, 1940 (81 y.o. Elam Dutch Primary Care Provider: Leanna Battles Other Clinician: Referring Provider: Treating Provider/Extender: Tamala Fothergill in Treatment: 0 Diagnosis Coding ICD-10 Codes Code Description [phone]  Pressure ulcer of right ankle, stage 3 L02.31 Cutaneous abscess of buttock E27.40 Unspecified adrenocortical insufficiency Z79.52 Long term (current) use of systemic steroids I10 Essential (primary) hypertension Facility Procedures CPT4 Code Description Modifier Quantity [id number] - WOUND CARE VISIT-LEV 3 EST PT 25 1 [id number] - DEBRIDE WOUND 1ST 20 SQ CM OR < 1 ICD-10 Diagnosis Description 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, stage 3 [id number] 10060 - I and D Abscess; simple/single 1 ICD-10 Diagnosis Description L02.31 Cutaneous abscess of buttock Physician Procedures Quantity CPT4 Code Description Modifier [id number] 22482 - WC PHYS LEVEL 4 - NEW PT 25 1 ICD-10 Diagnosis Description 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, stage 3 L02.31 Cutaneous abscess of buttock Z79.52 Long term (current) use of systemic steroids 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

## 2021-11-25 NOTE — Progress Notes (Signed)
PASTY, MANNINEN Yu ([id number]) [id number] Page 1 of 12 Visit Report for 11/24/2021 Allergy List Details Patient Name: Date of Service: Andrea Yu 11/24/2021 9:00 [name] Medical Record Number: [id number] Patient Account Number: [id number] Date of Birth/Sex: Treating RN: December 08, 1940 (81 y.o. Elam Dutch Primary Care Cybele Maule: Leanna Battles Other Clinician: Referring Daija Routson: Treating Patience Nuzzo/Extender: Tamala Fothergill in Treatment: 0 Allergies Active Allergies penicillin Reaction: anaphylaxis Severity: Severe Prolia ceftriaxone ciprofloxacin Reaction: hallucinations Fosamax latex Reaction: swelling Severity: Severe lisinopril Reaction: cough tuberculin,PPD,multi-puncture Allergy Notes Electronic Signature(s) Andrea Yu: 11/24/2021 5:36:35 PM By: Baruch Gouty RN, BSN Entered By: Baruch Gouty on 11/24/2021 09:52:36 -------------------------------------------------------------------------------- Arrival Information Details Patient Name: Date of Service: Andrea Pouch Yu. 11/24/2021 9:00 [name] Medical Record Number: [id number] Patient Account Number: [id number] Date of Birth/Sex: Treating RN: January 07, 1941 (80 y.o. Elam Dutch Primary Care Haelee Bolen: Leanna Battles Other Clinician: Referring Pj Zehner: Treating Leahann Lempke/Extender: Tamala Fothergill in Treatment: 0 Visit Information Patient Arrived: Wheel Chair Arrival Time: 09:38 Accompanied By: daughter Transfer Assistance: Manual Patient Identification Verified: Yes Secondary Verification Process CompletedLORELIE, BIERMANN Yu ([id number]) [id number] Page 2 of 12 Patient Requires Transmission-Based Precautions: No Patient Has Alerts: No Electronic Signature(s) Andrea Yu: 11/24/2021 5:36:35 PM By: Baruch Gouty RN, BSN Entered By: Baruch Gouty on 11/24/2021  09:46:25 -------------------------------------------------------------------------------- Clinic Level of Care Assessment Details Patient Name: Date of Service: Andrea Yu 11/24/2021 9:00 [name] Medical Record Number: [id number] Patient Account Number: [id number] Date of Birth/Sex: Treating RN: 1940-08-24 (81 y.o. Elam Dutch Primary Care Ludie Pavlik: Leanna Battles Other Clinician: Referring Jamarien Rodkey: Treating Vanshika Jastrzebski/Extender: Tamala Fothergill in Treatment: 0 Clinic Level of Care Assessment Items TOOL 1 Quantity Score _0  - 0 Use when EandM and Procedure is performed on INITIAL visit ASSESSMENTS - Nursing Assessment / Reassessment X- 1 20 General Physical Exam (combine w/ comprehensive assessment (listed just below) when performed on new pt. evals) X- 1 25 Comprehensive Assessment (HX, ROS, Risk Assessments, Wounds Hx, etc.) ASSESSMENTS - Wound and Skin Assessment / Reassessment _1  - 0 Dermatologic / Skin Assessment (not related to wound area) ASSESSMENTS - Ostomy and/or Continence Assessment and Care _2  - 0 Incontinence Assessment and Management _3  - 0 Ostomy Care Assessment and Management (repouching, etc.) PROCESS - Coordination of Care X - Simple Patient / Family Education for ongoing care 1 15 _4  - 0 Complex (extensive) Patient / Family Education for ongoing care X- 1 10 Staff obtains Programmer, systems, Records, T Results / Process Orders est X- 1 10 Staff telephones HHA, Nursing Homes / Clarify orders / etc _5  - 0 Routine Transfer to another Facility (non-emergent condition) _6  - 0 Routine Hospital Admission (non-emergent condition) X- 1 15 New Admissions / Biomedical engineer / Ordering NPWT Apligraf, etc. , _7  - 0 Emergency Hospital Admission (emergent condition) PROCESS - Special Needs _8  - 0 Pediatric / Minor Patient Management _9  - 0 Isolation Patient Management _10  - 0 Hearing / Language / Visual special needs _11  -  0 Assessment of Community assistance (transportation, D/C planning, etc.) _12  - 0 Additional assistance / Altered mentation _13  - 0 Support Surface(s) Assessment (bed, cushion, seat, etc.) INTERVENTIONS - Miscellaneous _14  - 0 External ear exam _15  - 0 Patient Transfer (multiple staff / Civil Service fast streamer / Similar devices) _16  - 0 Simple Staple / Suture removal (25 or less) Andrea Yu, Andrea Yu ([id number]) [phone].pdf Page 3 of 12 _17  - 0 Complex Staple / Suture removal (26 or more) _18  -  0 Hypo/Hyperglycemic Management (do not check if billed separately) X- 1 15 Ankle / Brachial Index (ABI) - do not check if billed separately Has the patient been seen at the hospital within the last three years: Yes Total Score: 110 Level Of Care: New/Established - Level 3 Electronic Signature(s) Andrea Yu: 11/24/2021 5:36:35 PM By: Baruch Gouty RN, BSN Entered By: Baruch Gouty on 11/24/2021 12:54:07 -------------------------------------------------------------------------------- Encounter Discharge Information Details Patient Name: Date of Service: Andrea Yu. 11/24/2021 9:00 [name] Medical Record Number: [id number] Patient Account Number: [id number] Date of Birth/Sex: Treating RN: Sep 29, 1940 (80 y.o. Elam Dutch Primary Care Chaim Gatley: Leanna Battles Other Clinician: Referring Harvel Meskill: Treating Deaunte Dente/Extender: Tamala Fothergill in Treatment: 0 Encounter Discharge Information Items Post Procedure Vitals Discharge Condition: Stable Temperature (F): 98.2 Ambulatory Status: Wheelchair Pulse (bpm): 83 Discharge Destination: Home Respiratory Rate (breaths/min): 18 Transportation: Private Auto Blood Pressure (mmHg): 125/72 Accompanied By: daughter Schedule Follow-up Appointment: Yes Clinical Summary of Care: Patient Declined Electronic Signature(s) Andrea Yu: 11/24/2021 5:36:35 PM By: Baruch Gouty RN, BSN Entered By: Baruch Gouty on  11/24/2021 15:39:47 -------------------------------------------------------------------------------- Lower Extremity Assessment Details Patient Name: Date of Service: Andrea Pouch Yu. 11/24/2021 9:00 [name] Medical Record Number: [id number] Patient Account Number: [id number] Date of Birth/Sex: Treating RN: [birth year] (81 y.o. Elam Dutch Primary Care Obediah Welles: Leanna Battles Other Clinician: Referring Cerina Leary: Treating Dereona Kolodny/Extender: Tamala Fothergill in Treatment: 0 Edema Assessment Assessed: [Left: No] [Right: No] Edema: [Left: N] [Right: o] Calf Left: Right: Point of Measurement: From Medial Instep 31.5 cm Ankle Left: Right: Point of Measurement: From Medial Instep 20.5 cm Vascular Assessment Left: [[id number] Page 4 of 12Right:] Pulses: Dorsalis Pedis Palpable: [[id number] Page 4 of 12Yes] Blood Pressure: Brachial: [[id number].pdf Page 4 of 12125] Ankle: [[id number] Page 4 of 12Dorsalis Pedis: 120 0.96] Electronic Signature(s) Andrea Yu: 11/24/2021 5:36:35 PM By: Baruch Gouty RN, BSN Entered By: Baruch Gouty on 11/24/2021 10:24:56 -------------------------------------------------------------------------------- Multi Wound Chart Details Patient Name: Date of Service: Andrea Pouch Yu. 11/24/2021 9:00 [name] Medical Record Number: [id number] Patient Account Number: [id number] Date of Birth/Sex: Treating RN: [birth year] (80 y.o. Elam Dutch Primary Care Dondrell Loudermilk: Leanna Battles Other Clinician: Referring Yun Gutierrez: Treating Savahanna Almendariz/Extender: Tamala Fothergill in Treatment: 0 Vital Signs Height(in): 59 Pulse(bpm): 83 Weight(lbs): 155 Blood Pressure(mmHg): 125/72 Body Mass Index(BMI): 31.3 Temperature(F): 98.2 Respiratory Rate(breaths/min): 18 [1:Photos:] [N/Yu:N/Yu] Right, Medial Malleolus N/Yu N/Yu Wound  Location: Pressure Injury N/Yu N/Yu Wounding Event: Pressure Ulcer N/Yu N/Yu Primary Etiology: Cataracts, Arrhythmia, Hypertension, N/Yu N/Yu Comorbid History: Osteoarthritis, Osteomyelitis, Neuropathy, Confinement Anxiety 11/16/2021 N/Yu N/Yu Date Acquired: 0 N/Yu N/Yu Weeks of Treatment: Open N/Yu N/Yu Wound Status: No N/Yu N/Yu Wound Recurrence: 1.4x1.4x0.1 N/Yu N/Yu Measurements L x W x D (cm) 1.539 N/Yu N/Yu Yu (cm) : rea 0.154 N/Yu N/Yu Volume (cm) : Category/Stage III N/Yu N/Yu Classification: Medium N/Yu N/Yu Exudate Yu mount: Serosanguineous N/Yu N/Yu Exudate Type: red, brown N/Yu N/Yu Exudate Color: Distinct, outline attached N/Yu N/Yu Wound Margin: Small (1-33%) N/Yu N/Yu Granulation Yu mount: Red N/Yu N/Yu Granulation Quality: Large (67-100%) N/Yu N/Yu Necrotic Yu mount: Fat Layer (Subcutaneous Tissue): Yes N/Yu N/Yu Exposed Structures: Fascia: No Tendon: No Muscle: No Joint: No Bone: No None N/Yu N/Yu Epithelialization: Debridement - Selective/Open Wound N/Yu N/Yu Debridement: CHRISTOL, THETFORD Yu ([id number]) [id number] Page 5 of 12 10:35 N/Yu N/Yu Pre-procedure Verification/Time Out Taken: Lidocaine 4% Topical Solution N/Yu N/Yu Pain Control: Slough N/Yu N/Yu Tissue Debrided: Non-Viable Tissue N/Yu N/Yu Level: 1.96 N/Yu  N/Yu Debridement Yu (sq cm): rea Curette N/Yu N/Yu Instrument: Minimum N/Yu N/Yu Bleeding: Pressure N/Yu N/Yu Hemostasis Yu chieved: 0 N/Yu N/Yu Procedural Pain: 0 N/Yu N/Yu Post Procedural Pain: Procedure was tolerated well N/Yu N/Yu Debridement Treatment Response: 1.4x1.4x0.1 N/Yu N/Yu Post Debridement Measurements L x W x D (cm) 0.154 N/Yu N/Yu Post Debridement Volume: (cm) Category/Stage III N/Yu N/Yu Post Debridement Stage: No Abnormalities Noted N/Yu N/Yu Periwound Skin Texture: No Abnormalities Noted N/Yu N/Yu Periwound Skin Moisture: Erythema: Yes N/Yu N/Yu Periwound Skin Color: Measured: 1cm N/Yu N/Yu Erythema Measurement: No Abnormality N/Yu  N/Yu Temperature: Debridement N/Yu N/Yu Procedures Performed: Treatment Notes Electronic Signature(s) Andrea Yu: 11/24/2021 11:03:27 AM By: Fredirick Maudlin MD FACS Andrea Yu: 11/24/2021 5:36:35 PM By: Baruch Gouty RN, BSN Entered By: Fredirick Maudlin on 11/24/2021 11:03:27 -------------------------------------------------------------------------------- Multi-Disciplinary Care Plan Details Patient Name: Date of Service: Andrea Pouch Yu. 11/24/2021 9:00 [name] Medical Record Number: [id number] Patient Account Number: [id number] Date of Birth/Sex: Treating RN: 07/25/1940 (80 y.o. Martyn Malay, Linda Primary Care Anay Rathe: Leanna Battles Other Clinician: Referring Anisa Leanos: Treating Demica Zook/Extender: Tamala Fothergill in Treatment: 0 Active Inactive Abuse / Safety / Falls / Self Care Management Nursing Diagnoses: History of Falls Knowledge deficit related to abuse or neglect Knowledge deficit related to: safety; personal, health (wound), emergency Potential for falls Goals: Patient/caregiver will verbalize/demonstrate measures taken to prevent injury and/or falls Date Initiated: 11/24/2021 Target Resolution Date: 12/22/2021 Goal Status: Active Interventions: Assess fall risk on admission and as needed Assess: immobility, friction, shearing, incontinence upon admission and as needed Assess impairment of mobility on admission and as needed per policy Notes: Pressure Nursing Diagnoses: Knowledge deficit related to causes and risk factors for pressure ulcer development Knowledge deficit related to management of pressures ulcers Potential for impaired tissue integrity related to pressure, friction, moisture, and shear Andrea Yu, Andrea Yu ([id number]) [id number] Page 6 of 12 Goals: Patient/caregiver will verbalize understanding of pressure ulcer management Date Initiated: 11/24/2021 Target Resolution Date: 12/22/2021 Goal Status:  Active Interventions: Assess: immobility, friction, shearing, incontinence upon admission and as needed Assess offloading mechanisms upon admission and as needed Assess potential for pressure ulcer upon admission and as needed Provide education on pressure ulcers Treatment Activities: Pressure reduction/relief device ordered : 11/24/2021 Notes: Soft Tissue Infection Nursing Diagnoses: Impaired tissue integrity Potential for infection: soft tissue Goals: Patient's soft tissue infection will resolve Date Initiated: 11/24/2021 Target Resolution Date: 12/22/2021 Goal Status: Active Signs and symptoms of infection will be recognized early to allow for prompt treatment Date Initiated: 11/24/2021 Target Resolution Date: 12/22/2021 Goal Status: Active Interventions: Assess signs and symptoms of infection every visit Provide education on infection Treatment Activities: Culture : 11/24/2021 Culture and sensitivity : 11/24/2021 Systemic antibiotics : 11/24/2021 Notes: Wound/Skin Impairment Nursing Diagnoses: Impaired tissue integrity Knowledge deficit related to ulceration/compromised skin integrity Goals: Patient/caregiver will verbalize understanding of skin care regimen Date Initiated: 11/24/2021 Target Resolution Date: 12/23/2021 Goal Status: Active Ulcer/skin breakdown will have Yu volume reduction of 30% by week 4 Date Initiated: 11/24/2021 Target Resolution Date: 12/22/2021 Goal Status: Active Interventions: Assess patient/caregiver ability to obtain necessary supplies Assess patient/caregiver ability to perform ulcer/skin care regimen upon admission and as needed Assess ulceration(s) every visit Provide education on ulcer and skin care Treatment Activities: Skin care regimen initiated : 11/24/2021 Topical wound management initiated : 11/24/2021 Notes: Electronic Signature(s) Andrea Yu: 11/24/2021 5:36:35 PM By: Baruch Gouty RN, BSN Entered By: Baruch Gouty on  11/24/2021 12:51:38 Andrea Yu ([id number]) [id number] Page 7 of 12 -------------------------------------------------------------------------------- Non-Wound  Condition Assessment Details Patient Name: Date of Service: Andrea Yu 11/24/2021 9:00 [name] Medical Record Number: [id number] Patient Account Number: [id number] Date of Birth/Sex: Treating RN: 07-17-1940 (81 y.o. Elam Dutch Primary Care Jeniyah Menor: Leanna Battles Other Clinician: Referring Jayln Branscom: Treating Rachelann Enloe/Extender: Tamala Fothergill in Treatment: 0 Non-Wound Condition: Condition: Other Dermatologic Condition Location: Gluteus Side: Right Notes: 9.5x9.5 cm raised, red, tender area right buttock Photos Periwound Skin Texture Texture Color No Abnormalities Noted: No No Abnormalities Noted: No Induration: Yes Erythema: Yes Moisture Temperature / Pain No Abnormalities Noted: Yes Temperature: Hot Tenderness on Palpation: Yes Electronic Signature(s) Andrea Yu: 11/24/2021 5:36:35 PM By: Baruch Gouty RN, BSN Entered By: Baruch Gouty on 11/24/2021 10:16:02 -------------------------------------------------------------------------------- Pain Assessment Details Patient Name: Date of Service: Andrea Pouch Yu. 11/24/2021 9:00 [name] Medical Record Number: [id number] Patient Account Number: [id number] Date of Birth/Sex: Treating RN: Mar 27, 1940 (81 y.o. Elam Dutch Primary Care Toddrick Sanna: Leanna Battles Other Clinician: Referring Bertina Guthridge: Treating Limuel Nieblas/Extender: Tamala Fothergill in Treatment: 0 Active Problems Location of Pain Severity and Description of Pain Patient Has Paino Yes Site Locations Pain Location: Andrea Yu, Andrea Yu ([id number]) [phone].pdf Page 8 of 12 Pain Location: Pain in Ulcers With Dressing Change: No Duration of the Pain. Constant / Intermittento Intermittent Rate the  pain. Current Pain Level: 0 Worst Pain Level: 3 Character of Pain Describe the Pain: Tender Pain Management and Medication Current Pain Management: Other: reposition Is the Current Pain Management Adequate: Adequate How does your wound impact your activities of daily livingo Sleep: No Bathing: No Appetite: No Relationship With Others: No Bladder Continence: No Emotions: No Bowel Continence: No Work: No Toileting: No Drive: No Dressing: No Hobbies: No Engineer, maintenance) Andrea Yu: 11/24/2021 5:36:35 PM By: Baruch Gouty RN, BSN Entered By: Baruch Gouty on 11/24/2021 10:25:39 -------------------------------------------------------------------------------- Patient/Caregiver Education Details Patient Name: Date of Service: Andrea Yu 10/31/2023andnbsp9:00 [name] Medical Record Number: [id number] Patient Account Number: [id number] Date of Birth/Gender: Treating RN: March 23, 1940 (81 y.o. Elam Dutch Primary Care Physician: Leanna Battles Other Clinician: Referring Physician: Treating Physician/Extender: Tamala Fothergill in Treatment: 0 Education Assessment Education Provided To: Patient and Caregiver Education Topics Provided Pressure: Methods: Explain/Verbal Responses: Reinforcements needed, State content correctly Wound/Skin Impairment: Methods: Explain/Verbal Responses: Reinforcements needed, State content correctly Electronic Signature(s) Andrea Yu: 11/24/2021 5:36:35 PM By: Baruch Gouty RN, BSN Andrea Yu, Andrea Yu: ([id number]) Glynn Yu 11/24/2021 5:36:35 PM By: Baruch Gouty RN, BSN [id number] Page 9 of 12 Entered By: Baruch Gouty on 11/24/2021 12:52:21 -------------------------------------------------------------------------------- Wound Assessment Details Patient Name: Date of Service: Andrea Yu 11/24/2021 9:00 [name] Medical Record Number: [id number] Patient Account Number: [id number] Date of  Birth/Sex: Treating RN: 22-Oct-1940 (81 y.o. Elam Dutch Primary Care Trevian Hayashida: Leanna Battles Other Clinician: Referring Tiernan Suto: Treating Nickalos Petersen/Extender: Tamala Fothergill in Treatment: 0 Wound Status Wound Number: 1 Primary Pressure Ulcer Etiology: Wound Location: Right, Medial Malleolus Wound Open Wounding Event: Pressure Injury Status: Date Acquired: 11/16/2021 Comorbid Cataracts, Arrhythmia, Hypertension, Osteoarthritis, Weeks Of Treatment: 0 History: Osteomyelitis, Neuropathy, Confinement Anxiety Clustered Wound: No Photos Wound Measurements Length: (cm) 1.4 Width: (cm) 1.4 Depth: (cm) 0.1 Area: (cm) 1.539 Volume: (cm) 0.154 % Reduction in Area: % Reduction in Volume: Epithelialization: None Tunneling: No Undermining: No Wound Description Classification: Category/Stage III Wound Margin: Distinct, outline attached Exudate Amount: Medium Exudate Type: Serosanguineous Exudate Color: red, brown Foul Odor After Cleansing: No Slough/Fibrino Yes Wound Bed Granulation Amount: Small (1-33%) Exposed  Structure Granulation Quality: Red Fascia Exposed: No Necrotic Amount: Large (67-100%) Fat Layer (Subcutaneous Tissue) Exposed: Yes Necrotic Quality: Adherent Slough Tendon Exposed: No Muscle Exposed: No Joint Exposed: No Bone Exposed: No Periwound Skin Texture Texture Color No Abnormalities Noted: Yes No Abnormalities Noted: No Erythema: Yes Moisture Erythema Measurement: Measured No Abnormalities Noted: Yes 1 cm Temperature / Pain Temperature: No Abnormality Andrea Yu, Andrea Yu ([id number]) [phone].pdf Page 10 of 12 Treatment Notes Wound #1 (Malleolus) Wound Laterality: Right, Medial Cleanser Normal Saline Discharge Instruction: Cleanse the wound with Normal Saline prior to applying Yu clean dressing using gauze sponges, not tissue or cotton balls. [facility] Ancillary Kit - 15 Day Supply Discharge  Instruction: Use supplies as instructed; Kit contains: (15) Saline Bullets; (15) 3x3 Gauze; 15 pr Gloves Peri-Wound Care Topical Skintegrity Hydrogel 4 (oz) Discharge Instruction: Apply hydrogel as directed Primary Dressing Promogran Prisma Matrix, 4.34 (sq in) (silver collagen) Discharge Instruction: Moisten collagen with saline or hydrogel Secondary Dressing Zetuvit Plus Silicone Border Dressing 5x5 (in/in) Discharge Instruction: Apply silicone border over primary dressing as directed. Secured With Compression Wrap Compression Stockings Environmental education officer) Andrea Yu: 11/24/2021 5:36:35 PM By: Baruch Gouty RN, BSN Entered By: Baruch Gouty on 11/24/2021 10:15:08 -------------------------------------------------------------------------------- Wound Assessment Details Patient Name: Date of Service: Andrea Pouch Yu. 11/24/2021 9:00 [name] Medical Record Number: [id number] Patient Account Number: [id number] Date of Birth/Sex: Treating RN: 12-26-1940 (80 y.o. Elam Dutch Primary Care Raynold Blankenbaker: Leanna Battles Other Clinician: Referring Dhyan Noah: Treating Izamar Linden/Extender: Tamala Fothergill in Treatment: 0 Wound Status Wound Number: 2 Primary Abscess Etiology: Wound Location: Right Gluteus Wound Open Wounding Event: Bump Status: Date Acquired: 09/28/2021 Comorbid Cataracts, Arrhythmia, Hypertension, Osteoarthritis, Weeks Of Treatment: 0 History: Osteomyelitis, Neuropathy, Confinement Anxiety Clustered Wound: No Wound Measurements Length: (cm) 1.4 Width: (cm) 9.5 Depth: (cm) 3 Area: (cm) 10.446 Volume: (cm) 31.337 % Reduction in Area: % Reduction in Volume: Epithelialization: None Tunneling: No Undermining: Yes Wound Description Classification: Full Thickness Without Exposed Support Structures Wound Margin: Well defined, not attached Exudate Amount: Large Exudate Type: Serosanguineous Exudate Color: red, brown Andrea Yu, Andrea Yu  ([id number]) Wound Bed Granulation Amount: Large (67-100%) Granulation Quality: Red Necrotic Amount: None Present (0%) Foul Odor After Cleansing: No Slough/Fibrino No [id number] Page 11 of 12 Exposed Structure Fascia Exposed: No Fat Layer (Subcutaneous Tissue) Exposed: Yes Tendon Exposed: No Muscle Exposed: No Joint Exposed: No Bone Exposed: No Periwound Skin Texture Texture Color No Abnormalities Noted: Yes No Abnormalities Noted: No Erythema: Yes Moisture No Abnormalities Noted: Yes Temperature / Pain Temperature: No Abnormality Tenderness on Palpation: Yes Treatment Notes Wound #2 (Gluteus) Wound Laterality: Right Cleanser Normal Saline Discharge Instruction: Cleanse the wound with Normal Saline prior to applying Yu clean dressing using gauze sponges, not tissue or cotton balls. Peri-Wound Care Topical Primary Dressing Dakin's Solution 0.25%, 16 (oz) Discharge Instruction: Moisten gauze with Dakin's solution and pack into wound Secondary Dressing Zetuvit Plus Silicone Border Dressing 5x5 (in/in) Discharge Instruction: Apply silicone border over primary dressing as directed. Secured With Compression Wrap Compression Stockings Environmental education officer) Andrea Yu: 11/24/2021 5:36:35 PM By: Baruch Gouty RN, BSN Entered By: Baruch Gouty on 11/24/2021 11:09:45 -------------------------------------------------------------------------------- Vitals Details Patient Name: Date of Service: Andrea Childes [name] 11/24/2021 9:00 [name] Medical Record Number: [id number] Patient Account Number: [id number] Date of Birth/Sex: Treating RN: Apr 04, 1940 (80 y.o. Elam Dutch Primary Care Akaya Proffit: Leanna Battles Other Clinician: Referring Jenasia Dolinar: Treating Cyruss Arata/Extender: Tamala Fothergill in Treatment: 0 Vital Signs Time Taken: 09:46 Temperature (  F): 98.2 Height (in): 59 Pulse (bpm): 83 Source: Stated Respiratory  Rate (breaths/min): 18 Weight (lbs): 155 Blood Pressure (mmHg): 125/72 Source: Stated Reference Range: 80 - 120 mg / dl Body Mass Index (BMI): 31.3 Andrea Yu, Andrea Yu ([id number]) [id number] Page 12 of 12 Electronic Signature(s) Andrea Yu: 11/24/2021 5:36:35 PM By: Baruch Gouty RN, BSN Entered By: Baruch Gouty on 11/24/2021 09:47:06

## 2021-11-25 NOTE — Progress Notes (Signed)
Andrea Yu Yu ([id number]) [phone].pdf Page 1 of 4 Visit Report for 11/24/2021 Abuse Risk Screen Details Patient Name: Date of Service: Andrea Yu 11/24/2021 9:00 [name] Medical Record Number: [id number] Patient Account Number: [id number] Date of Birth/Sex: Treating RN: Sep 21, 1940 (81 y.o. Andrea Yu Primary Care Andrea Yu: Andrea Yu Other Clinician: Referring Andrea Yu: Treating Andrea Yu/Extender: Andrea Yu in Treatment: 0 Abuse Risk Screen Items Answer ABUSE RISK SCREEN: Has anyone close to you tried to hurt or harm you recentlyo No Do you feel uncomfortable with anyone in your familyo No Has anyone forced you do things that you didnt want to doo No Electronic Signature(s) Signed: 11/24/2021 5:36:35 PM By: Zenaida Deed RN, BSN Entered By: Zenaida Deed on 11/24/2021 10:03:55 -------------------------------------------------------------------------------- Activities of Daily Living Details Patient Name: Date of Service: Andrea Yu 11/24/2021 9:00 [name] Medical Record Number: [id number] Patient Account Number: [id number] Date of Birth/Sex: Treating RN: 1941-01-07 (81 y.o. Andrea Yu Primary Care Andrea Yu: Andrea Yu Other Clinician: Referring Andrea Yu: Treating Andrea Yu/Extender: Andrea Yu in Treatment: 0 Activities of Daily Living Items Answer Activities of Daily Living (Please select one for each item) Drive Automobile Not Able T Medications ake Need Assistance Use T elephone Completely Able Care for Appearance Need Assistance Use T oilet Need Assistance Bath / Shower Need Assistance Dress Self Need Assistance Feed Self Completely Able Walk Not Able Get In / Out Bed Need Assistance Housework Need Assistance Prepare Meals Need Assistance Handle Money Need Assistance Shop for Self Need Assistance Electronic Signature(s) Signed: 11/24/2021  5:36:35 PM By: Zenaida Deed RN, BSN Entered By: Zenaida Deed on 11/24/2021 10:04:55 [name] ([id number]) [id number] Page 2 of 4 -------------------------------------------------------------------------------- Education Screening Details Patient Name: Date of Service: Andrea Yu 11/24/2021 9:00 [name] Medical Record Number: [id number] Patient Account Number: [id number] Date of Birth/Sex: Treating RN: January 12, 1941 (81 y.o. Andrea Yu Primary Care Andrea Yu: Andrea Yu Other Clinician: Referring Andrea Yu: Treating Andrea Yu/Extender: Andrea Yu in Treatment: 0 Primary Learner Assessed: Patient Learning Preferences/Education Level/Primary Language Learning Preference: Explanation, Demonstration, Printed Material Highest Education Level: High School Preferred Language: English Cognitive Barrier Language Barrier: No Translator Needed: No Memory Deficit: No Emotional Barrier: No Cultural/Religious Beliefs Affecting Medical Care: No Physical Barrier Impaired Vision: No Impaired Hearing: No Decreased Hand dexterity: No Knowledge/Comprehension Knowledge Level: High Comprehension Level: High Ability to understand written instructions: High Ability to understand verbal instructions: High Motivation Anxiety Level: Calm Cooperation: Cooperative Education Importance: Acknowledges Need Interest in Health Problems: Asks Questions Perception: Coherent Willingness to Engage in Self-Management High Activities: Readiness to Engage in Self-Management High Activities: Electronic Signature(s) Signed: 11/24/2021 5:36:35 PM By: Zenaida Deed RN, BSN Entered By: Zenaida Deed on 11/24/2021 10:05:45 -------------------------------------------------------------------------------- Fall Risk Assessment Details Patient Name: Date of Service: Andrea Stacks [name] 11/24/2021 9:00 [name] Medical Record Number:  [id number] Patient Account Number: [id number] Date of Birth/Sex: Treating RN: 1940-07-22 (81 y.o. Andrea Yu Primary Care Andrea Yu: Andrea Yu Other Clinician: Referring Andrea Yu: Treating Andrea Yu/Extender: Andrea Yu in Treatment: 0 Fall Risk Assessment Items Have you had 2 or more falls in the last 12 monthso 0 Yes Andrea Yu, Andrea Yu ([id number]) [id number] Page 3 of 4 Have you had any fall that resulted in injury in the last 12 monthso 0 No FALLS RISK SCREEN History of falling - immediate or within 3 months 25 Yes Secondary diagnosis (Do you have 2 or  more medical diagnoseso) 0 No Ambulatory aid None/bed rest/wheelchair/nurse 0 Yes Crutches/cane/walker 0 No Furniture 0 No Intravenous therapy Access/Saline/Heparin Lock 0 No Gait/Transferring Normal/ bed rest/ wheelchair 0 No Weak (short steps with or without shuffle, stooped but able to lift head while walking, may seek 0 No support from furniture) Impaired (short steps with shuffle, may have difficulty arising from chair, head down, impaired 20 Yes balance) Mental Status Oriented to own ability 0 Yes Electronic Signature(s) Signed: 11/24/2021 5:36:35 PM By: Baruch Gouty RN, BSN Entered By: Baruch Gouty on 11/24/2021 10:06:15 -------------------------------------------------------------------------------- Foot Assessment Details Patient Name: Date of Service: Andrea Pouch Yu. 11/24/2021 9:00 [name] Medical Record Number: [id number] Patient Account Number: [id number] Date of Birth/Sex: Treating RN: 1940/06/14 (80 y.o. Andrea Yu Primary Care Ryson Bacha: Andrea Yu Other Clinician: Referring Andrea Yu: Treating Andrea Yu/Extender: Andrea Yu in Treatment: 0 Foot Assessment Items Site Locations + = Sensation present, - = Sensation absent, C = Callus, U = Ulcer R = Redness, W = Warmth, M = Maceration, PU =  Pre-ulcerative lesion F = Fissure, S = Swelling, D = Dryness Assessment Right: Left: Other Deformity: Yes No Prior Foot Ulcer: Yes No Prior Amputation: No No Charcot Joint: No No Ambulatory Status: Non-ambulatory Assistance Device: Wheelchair Andrea Yu Yu ([id number]) [phone].pdf Page 4 of 4 Gait: Administrator, arts) Signed: 11/24/2021 5:36:35 PM By: Baruch Gouty RN, BSN Entered By: Baruch Gouty on 11/24/2021 10:08:38 -------------------------------------------------------------------------------- Nutrition Risk Screening Details Patient Name: Date of Service: Andrea Yu 11/24/2021 9:00 [name] Medical Record Number: [id number] Patient Account Number: [id number] Date of Birth/Sex: Treating RN: 1941/01/21 (81 y.o. Andrea Yu Primary Care Ransom Nickson: Andrea Yu Other Clinician: Referring Kaimana Lurz: Treating Connar Keating/Extender: Andrea Yu in Treatment: 0 Height (in): 59 Weight (lbs): 155 Body Mass Index (BMI): 31.3 Nutrition Risk Screening Items Score Screening NUTRITION RISK SCREEN: I have an illness or condition that made me change the kind and/or amount of food I eat 0 No I eat fewer than two meals per day 0 No I eat few fruits and vegetables, or milk products 0 No I have three or more drinks of beer, liquor or wine almost every day 0 No I have tooth or mouth problems that make it hard for me to eat 0 No I don't always have enough money to buy the food I need 0 No I eat alone most of the time 0 No I take three or more different prescribed or over-the-counter drugs Yu day 1 Yes Without wanting to, I have lost or gained 10 pounds in the last six months 0 No I am not always physically able to shop, cook and/or feed myself 2 Yes Nutrition Protocols Good Risk Protocol Moderate Risk Protocol 0 Provide education on nutrition High Risk Proctocol Risk Level: Moderate Risk Score:  3 Electronic Signature(s) Signed: 11/24/2021 5:36:35 PM By: Baruch Gouty RN, BSN Entered By: Baruch Gouty on 11/24/2021 10:06:49

## 2021-12-04 ENCOUNTER — Encounter (HOSPITAL_BASED_OUTPATIENT_CLINIC_OR_DEPARTMENT_OTHER): Payer: Medicare Other | Attending: General Surgery | Admitting: General Surgery

## 2021-12-04 DIAGNOSIS — Z87891 Personal history of nicotine dependence: Secondary | ICD-10-CM | POA: Diagnosis not present

## 2021-12-04 DIAGNOSIS — G629 Polyneuropathy, unspecified: Secondary | ICD-10-CM | POA: Diagnosis not present

## 2021-12-04 DIAGNOSIS — Z6831 Body mass index (BMI) 31.0-31.9, adult: Secondary | ICD-10-CM | POA: Insufficient documentation

## 2021-12-04 DIAGNOSIS — Z7952 Long term (current) use of systemic steroids: Secondary | ICD-10-CM | POA: Diagnosis not present

## 2021-12-04 DIAGNOSIS — I4891 Unspecified atrial fibrillation: Secondary | ICD-10-CM | POA: Diagnosis not present

## 2021-12-04 DIAGNOSIS — L97511 Non-pressure chronic ulcer of other part of right foot limited to breakdown of skin: Secondary | ICD-10-CM | POA: Diagnosis not present

## 2021-12-04 DIAGNOSIS — Z96653 Presence of artificial knee joint, bilateral: Secondary | ICD-10-CM | POA: Insufficient documentation

## 2021-12-04 DIAGNOSIS — E2740 Unspecified adrenocortical insufficiency: Secondary | ICD-10-CM | POA: Diagnosis not present

## 2021-12-04 DIAGNOSIS — L0231 Cutaneous abscess of buttock: Secondary | ICD-10-CM | POA: Diagnosis not present

## 2021-12-04 DIAGNOSIS — I10 Essential (primary) hypertension: Secondary | ICD-10-CM | POA: Diagnosis not present

## 2021-12-04 DIAGNOSIS — K219 Gastro-esophageal reflux disease without esophagitis: Secondary | ICD-10-CM | POA: Diagnosis not present

## 2021-12-04 DIAGNOSIS — L89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, stage 3: Secondary | ICD-10-CM | POA: Diagnosis present

## 2021-12-04 DIAGNOSIS — F419 Anxiety disorder, unspecified: Secondary | ICD-10-CM | POA: Insufficient documentation

## 2021-12-04 DIAGNOSIS — M1990 Unspecified osteoarthritis, unspecified site: Secondary | ICD-10-CM | POA: Diagnosis not present

## 2021-12-04 NOTE — Progress Notes (Signed)
TIARRAH, SAVILLE Yu ([id number]) [id number] Page 1 of 10 Visit Report for 12/04/2021 Chief Complaint Document Details Patient Name: Date of Service: Andrea Yu 12/04/2021 2:45 PM Medical Record Number: [id number] Patient Account Number: [id number] Date of Birth/Sex: Treating RN: 1940-09-07 (81 y.o. F) Primary Care Provider: Leanna Battles Other Clinician: Referring Provider: Treating Provider/Extender: Kathlyn Sacramento in Treatment: 1 Information Obtained from: Patient Chief Complaint Patient is at the clinic for treatment of an open pressure ulcer on her right medial ankle, and Yu large abscess on her right buttock Electronic Signature(s) Signed: 12/04/2021 3:57:24 PM By: Fredirick Maudlin MD FACS Entered By: Fredirick Maudlin on 12/04/2021 15:57:24 -------------------------------------------------------------------------------- Debridement Details Patient Name: Date of Service: Andrea Yu 12/04/2021 2:45 PM Medical Record Number: [id number] Patient Account Number: [id number] Date of Birth/Sex: Treating RN: 20-Jul-1940 (80 y.o. Harlow Ohms Primary Care Provider: Leanna Battles Other Clinician: Referring Provider: Treating Provider/Extender: Kathlyn Sacramento in Treatment: 1 Debridement Performed for Assessment: Wound #1 Right,Medial Malleolus Performed By: Physician Fredirick Maudlin, MD Debridement Type: Debridement Level of Consciousness (Pre-procedure): Awake and Alert Pre-procedure Verification/Time Out Yes - 15:25 Taken: Start Time: 15:27 Pain Control: Lidocaine 4% T opical Solution T Area Debrided (L x W): otal 1.7 (cm) x 1.5 (cm) = 2.55 (cm) Tissue and other material debrided: Non-Viable, [place] Level: Non-Viable Tissue Debridement Description: Selective/Open Wound Instrument: Curette Bleeding: Minimum Hemostasis Achieved: Pressure Procedural Pain: 0 Post  Procedural Pain: 0 Response to Treatment: Procedure was tolerated well Level of Consciousness (Post- Awake and Alert procedure): Post Debridement Measurements of Total Wound Length: (cm) 1.7 Stage: Category/Stage III Width: (cm) 1.5 Depth: (cm) 0.1 Volume: (cm) 0.2 Character of Wound/Ulcer Post Debridement: Improved Post Procedure Diagnosis Same as Pre-procedure Andrea Yu, Andrea Yu ([id number]) [id number] Page 2 of 10 Electronic Signature(s) Signed: 12/04/2021 3:57:54 PM By: Adline Peals Signed: 12/04/2021 4:34:03 PM By: Fredirick Maudlin MD FACS Entered By: Adline Peals on 12/04/2021 15:31:56 -------------------------------------------------------------------------------- HPI Details Patient Name: Date of Service: Andrea Yu 12/04/2021 2:45 PM Medical Record Number: [id number] Patient Account Number: [id number] Date of Birth/Sex: Treating RN: [birth year] (80 y.o. 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 She is not diabetic and quit smoking over 40 years ago. 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 She has developed Yu pressure ulcer on the right medial malleolus. In addition, she apparently has had multiple cutaneous abscesses on her buttocks that have required repeated incision and drainage procedures. She has developed Yu large abscess on her right buttock that has become painful. She was recently treated with cephalexin by her PCP for urinary tract infection and also received Yu shot of Rocephin for the abscess, but it has not yet been incised or drained. She is nonambulatory but  is able to stand to transfer. She does not wear any sort of Prevalon boot. ABI in clinic today was 0.96. On her right medial malleolus, there is Yu circular ulcer. The surface is dry and fibrotic. There is some periwound erythema, but no malodor or purulent drainage. On her right buttock, there is Yu large fluctuant bulge about the size of an orange. It is also erythematous and tender. 12/04/2021: The culture from  her abscess grew out staph. Yu 10-day course of Bactrim was prescribed and she is currently taking this. Unfortunately, Dakin's solution was not delivered and only 2 x 2 gauze was sent, rather than Kerlix so the packing of the wound has been rather suboptimal. The wound cavity has light slough over all of the surfaces. Although covered, bone is palpable. The medial ankle wound looks about the same with Yu layer of slough accumulation. In addition, her daughter peeled some dry skin off of her foot this morning and she now has denuded areas over the majority of the plantar surface of her right foot. Electronic Signature(s) Signed: 12/04/2021 4:01:07 PM By: Fredirick Maudlin MD FACS Entered By: Fredirick Maudlin on 12/04/2021 16:01:07 -------------------------------------------------------------------------------- Physical Exam Details Patient Name: Date of Service: Andrea Yu 12/04/2021 2:45 PM Medical Record Number: [id number] Patient Account Number: [id number] Date of Birth/Sex: Treating RN: 19-Nov-1940 (80 y.o. F) Primary Care Provider: Leanna Battles Other Clinician: Referring Provider: Treating Provider/Extender: Kathlyn Sacramento in Treatment: 1 Constitutional Slightly hypertensive. . . . No acute distress.Marland Kitchen Respiratory Normal work of breathing on room air.. Notes 12/04/2021: The hip wound cavity has light slough over all of the surfaces. Although covered, bone is palpable. The medial ankle wound looks about the same with Yu layer of slough  accumulation. In addition, her daughter peeled some dry skin off of her foot this morning and she now has denuded areas over the majority of the plantar surface of her right foot. Andrea Yu, Andrea Yu ([id number]) [id number] Page 3 of 10 Electronic Signature(s) Signed: 12/04/2021 4:05:15 PM By: Fredirick Maudlin MD FACS Entered By: Fredirick Maudlin on 12/04/2021 16:05:15 -------------------------------------------------------------------------------- Physician Orders Details Patient Name: Date of Service: Andrea Yu 12/04/2021 2:45 PM Medical Record Number: [id number] Patient Account Number: [id number] Date of Birth/Sex: Treating RN: [birth year] (80 y.o. 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 - Dr. Celine Ahr RM 1 Return Yu Anesthetic (In clinic) Topical Lidocaine 4% applied to wound bed Bathing/ Shower/ Hygiene May shower and wash wound with soap and water. Off-Loading Multipodus Splint to: - prevalon boot right foot Turn and reposition every 2 hours [place] [facility] for wound care. May utilize formulary equivalent dressing for wound treatment orders unless otherwise specified. Dressing changes to be completed by [place] on Monday / Wednesday / Friday except when patient has scheduled visit at [facility]. Other Home Health Orders/Instructions: - [facility] Wound Treatment Wound #1 - Malleolus Wound Laterality: Right, Medial Cleanser: Normal Saline  (Generic) Every Other Day/30 Days Discharge Instructions: Cleanse the wound with Normal Saline prior to applying Yu clean dressing using gauze sponges, not tissue or cotton balls. Cleanser: Byram Ancillary Kit - 15 Day Supply (Generic) Every Other Day/30 Days Discharge Instructions: Use supplies as instructed; Kit contains: (15) Saline Bullets; (15) 3x3 Gauze; 15 pr Gloves Peri-Wound Care: Skin Prep (DME) (Generic) Every Other Day/30 Days Discharge Instructions: Use skin prep as directed Topical: Skintegrity Hydrogel 4 (oz) (Generic) Every Other Day/30 Days Discharge Instructions: Apply hydrogel as directed Prim Dressing: Promogran Prisma Matrix, 4.34 (sq in) (silver collagen) (Dispense As Written) Every  Other Day/30 Days ary Discharge Instructions: Moisten collagen with saline or hydrogel Secondary Dressing: Zetuvit Plus Silicone Border Dressing 5x5 (in/in) (Dispense As Written) Every Other Day/30 Days Discharge Instructions: Apply silicone border over primary dressing as directed. Secured With: [employer], 4.5x3.1 (in/yd) (DME) (Generic) Every Other Day/30 Days Discharge Instructions: Secure with Kerlix as directed. Wound #2 - Gluteus Wound Laterality: Right Andrea Yu, Andrea Yu ([id number]) [id number] Page 4 of 10 Cleanser: Normal Saline 1 x Per Day/30 Days Discharge Instructions: Cleanse the wound with Normal Saline prior to applying Yu clean dressing using gauze sponges, not tissue or cotton balls. Peri-Wound Care: Skin Prep 1 x Per Day/30 Days Discharge Instructions: Use skin prep as directed Prim Dressing: Dakin's Solution 0.25%, 16 (oz) (Generic) 1 x Per Day/30 Days ary Discharge Instructions: Moisten gauze with Dakin's solution and pack into wound Prim Dressing: Kerlix AMD Roll Dressing, 4.5x 4.1 (in/in) (DME) (Generic) 1 x Per Day/30 Days ary Discharge Instructions: moisten with Dakin's and pack into wound Secondary Dressing: Zetuvit Plus Silicone  Border Dressing 5x5 (in/in) (Dispense As Written) 1 x Per Day/30 Days Discharge Instructions: Apply silicone border over primary dressing as directed. Wound #3 - Foot Wound Laterality: Plantar, Right Prim Dressing: Xeroform Occlusive Gauze Dressing, 4x4 in (DME) (Generic) 3 x Per Week/30 Days ary Discharge Instructions: Apply to wound bed as instructed Secondary Dressing: Woven Gauze Sponge, Non-Sterile 4x4 in (DME) (Generic) 3 x Per Week/30 Days Discharge Instructions: Apply over primary dressing as directed. Secured With: [employer], 4.5x3.1 (in/yd) (DME) (Generic) 3 x Per Week/30 Days Discharge Instructions: Secure with Kerlix as directed. Secured With: 20M Medipore H Soft Cloth Surgical T ape, 4 x 10 (in/yd) (DME) (Generic) 3 x Per Week/30 Days Discharge Instructions: Secure with tape as directed. Patient Medications llergies: penicillin, latex, Prolia, ceftriaxone, ciprofloxacin, Fosamax, lisinopril, tuberculin,PPD,multi-puncture Yu Notifications Medication Indication Start End 12/04/2021 Dakin's Solution DOSE miscellaneous 0.25 % solution - Moisten Kerlix with Dakin solution and pack hip wound Electronic Signature(s) Signed: 12/04/2021 4:34:03 PM By: Fredirick Maudlin MD FACS Previous Signature: 12/04/2021 4:09:20 PM Version By: Fredirick Maudlin MD FACS Previous Signature: 12/04/2021 3:57:54 PM Version By: Adline Peals Entered By: Fredirick Maudlin on 12/04/2021 16:09:35 -------------------------------------------------------------------------------- Problem List Details Patient Name: Date of Service: Andrea Yu 12/04/2021 2:45 PM Medical Record Number: [id number] Patient Account Number: [id number] Date of Birth/Sex: Treating RN: 1940-10-09 (81 y.o. 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, stage 3 11/24/2021 No Yes L02.31 Cutaneous abscess of buttock 11/24/2021 No Yes Andrea Yu, Andrea Yu ([id number]) [id number] Page 5 of 10 E27.40 Unspecified adrenocortical insufficiency 11/24/2021 No Yes Z79.52 Long term (current) use of systemic steroids 11/24/2021 No Yes I10 Essential (primary) hypertension 11/24/2021 No Yes 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 She is not diabetic and quit smoking over 40 years ago. 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 She has developed Yu pressure ulcer on the right medial malleolus. In addition, she apparently has had multiple  cutaneous abscesses on her buttocks that have required repeated incision and drainage procedures. She has developed Yu large abscess on her right buttock that has become painful. She was recently treated with cephalexin by her PCP for urinary tract infection and also received Yu shot of Rocephin for the abscess, but it has not yet been incised or drained. She is nonambulatory but is able to stand to transfer. She does not wear any sort of Prevalon boot. ABI in clinic today was 0.96. On her right medial malleolus, there is Yu circular ulcer. The surface is dry and fibrotic. There is some periwound erythema, but no malodor or purulent drainage. On her right buttock, there is Yu large fluctuant bulge about the size of an orange. It is also erythematous and tender. 12/04/2021: The culture from her abscess grew out staph. Yu 10-day course of Bactrim was prescribed and she is currently taking this. Unfortunately, Dakin's solution was not delivered and only 2 x 2 gauze was sent, rather than Kerlix so the packing of the wound has been rather suboptimal. The wound cavity has light slough over all of the surfaces. Although covered, bone is palpable. The medial ankle wound looks about the same with Yu layer of slough accumulation. In addition, her daughter peeled some dry skin off of her foot this morning and she now has denuded areas over the majority of the plantar surface of her right foot. Patient History Information obtained from Patient, Caregiver, Chart. Family History Cancer - Father,Maternal Grandparents,Siblings,Child, Heart Disease - Father, Hypertension - Father, Thyroid Problems - Mother, No family history of Diabetes, Hereditary Spherocytosis, Kidney Disease, Lung Disease, Seizures, Stroke, Tuberculosis. Social History Former smoker - quit 60 yr ago, Marital Status - Widowed, Alcohol Use - Never, Drug Use - No History, Caffeine Use - Daily - tea, soda. Andrea Yu, Andrea Yu ([id number])  [id number] Page 6 of 10 Medical History Eyes Patient has history of Cataracts - bil extracted Denies history of Glaucoma, Optic Neuritis Cardiovascular Patient has history of Arrhythmia - afib, Hypertension Endocrine Denies history of Type I Diabetes, Type II Diabetes Genitourinary Denies history of End Stage Renal Disease Integumentary (Skin) Denies history of History of Burn Musculoskeletal Patient has history of Osteoarthritis, Osteomyelitis - pelvic Neurologic Patient has history of Neuropathy Oncologic Denies history of Received Chemotherapy, Received Radiation Psychiatric Patient has history of Confinement Anxiety Denies history of Anorexia/bulimia Hospitalization/Surgery History - TEE. - right ankle fusion. - bil knee replacements. - bil cataract extractions. - posterior lumbar fusion. - vaginal hysterectomy. - cholecystectomy. 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 No acute distress.. Vitals Time Taken: 3:00 PM, Height: 59 in, Weight: 155 lbs, BMI: 31.3, Temperature: 98.3 F, Pulse: 68 bpm, Respiratory Rate: 18 breaths/min,  Blood Pressure: 143/69 mmHg. Respiratory Normal work of breathing on room air.. General Notes: 12/04/2021: The hip wound cavity has light slough over all of the surfaces. Although covered, bone is palpable. The medial ankle wound looks about the same with Yu layer of slough accumulation. In addition, her daughter peeled some dry skin off of her foot this morning and she now has denuded areas over the majority of the plantar surface of her right foot. Integumentary (Hair, Skin) Wound #1 status is Open. Original cause of  wound was Pressure Injury. The date acquired was: 11/16/2021. The wound has been in treatment 1 weeks. The wound is located on the Right,Medial Malleolus. The wound measures 1.7cm length x 1.5cm width x 0.1cm depth; 2.003cm^2 area and 0.2cm^3 volume. There is Fat Layer (Subcutaneous Tissue) exposed. There is no tunneling or undermining noted. There is Yu medium amount of serosanguineous drainage noted. The wound margin is distinct with the outline attached to the wound base. There is small (1-33%) red granulation within the wound bed. There is Yu large (67-100%) amount of necrotic tissue within the wound bed including Adherent Slough. The periwound skin appearance had no abnormalities noted for texture. The periwound skin appearance had no abnormalities noted for moisture. The periwound skin appearance exhibited: Erythema. The surrounding wound skin color is noted with erythema. Erythema is measured at 1 cm. Periwound temperature was noted as No Abnormality. Wound #2 status is Open. Original cause of wound was Bump. The date acquired was: 09/28/2021. The wound has been in treatment 1 weeks. The wound is located on the Right Gluteus. The wound measures 1.2cm length x 2.5cm width x 2.7cm depth; 2.356cm^2 area and 6.362cm^3 volume. There is bone and Fat Layer (Subcutaneous Tissue) exposed. There is undermining starting at 12:00 and ending at 12:00 with Yu maximum distance of 5.5cm. There is Yu large amount of purulent drainage noted. The wound margin is well defined and not attached to the wound base. There is medium (34-66%) red granulation within the wound bed. There is Yu medium (34-66%) amount of necrotic tissue within the wound bed including Adherent Slough. The periwound skin appearance had no abnormalities noted for texture. The periwound skin appearance had no abnormalities noted for moisture. The periwound skin appearance had no abnormalities noted for color. Periwound temperature was noted as No  Abnormality. The periwound has tenderness on palpation. Wound #3 status is Open. Original cause of wound was Skin T ear/Laceration. The date acquired was: 12/03/2021. The wound is located on the [place]. The wound measures 11.3cm length x 5.5cm width x 0.1cm depth; 48.812cm^2 area and 4.881cm^3 volume. There is Fat Layer (Subcutaneous Tissue) exposed. There is no tunneling or undermining noted. There is Yu medium amount of serosanguineous drainage noted. The wound margin is distinct with the outline attached to the wound base. There is large (67-100%) red granulation within the wound bed. There is no necrotic tissue within the wound bed. The periwound skin appearance had no abnormalities noted for texture. The periwound skin appearance had no abnormalities noted for moisture. The periwound skin appearance had no abnormalities noted for color. Periwound temperature was noted as No Abnormality. Andrea Yu, Andrea Yu ([id number]) [id number] Page 7 of 10 Assessment Active Problems ICD-10 Pressure ulcer of right ankle, stage 3 Cutaneous abscess of buttock Unspecified adrenocortical insufficiency Long term (current) use of systemic steroids Essential (primary) hypertension Non-pressure chronic ulcer of other part of right foot limited to breakdown of skin Procedures Wound #1 Pre-procedure diagnosis of Wound #1 is Yu  Pressure Ulcer located on the Right,Medial Malleolus . There was Yu Selective/Open Wound Non-Viable Tissue Debridement with Yu total area of 2.55 sq cm performed by Fredirick Maudlin, MD. With the following instrument(s): Curette to remove Non-Viable tissue/material. Material removed includes [address] [employer] after achieving pain control using Lidocaine 4% Topical Solution. No specimens were taken. Yu time out was conducted at 15:25, prior to the start of the procedure. Yu Minimum amount of bleeding was controlled with Pressure. The procedure was tolerated well with Yu pain  level of 0 throughout and Yu pain level of 0 following the procedure. Post Debridement Measurements: 1.7cm length x 1.5cm width x 0.1cm depth; 0.2cm^3 volume. Post debridement Stage noted as Category/Stage III. Character of Wound/Ulcer Post Debridement is improved. Post procedure Diagnosis Wound #1: Same as Pre-Procedure Plan Follow-up Appointments: Return Appointment in 1 week. - Dr. Celine Ahr RM 1 Anesthetic: (In clinic) Topical Lidocaine 4% applied to wound bed Bathing/ Shower/ Hygiene: May shower and wash wound with soap and water. Off-Loading: Multipodus Splint to: - prevalon boot right foot Turn and reposition every 2 hours Home Health: Admit to [place] for wound care. May utilize formulary equivalent dressing for wound treatment orders unless otherwise specified. Dressing changes to be completed by [place] on Monday / Wednesday / Friday except when patient has scheduled visit at [facility]. Other Home Health Orders/Instructions: - [facility] The following medication(s) was prescribed: Dakin's Solution miscellaneous 0.25 % solution Moisten Kerlix with Dakin solution and pack hip wound starting 12/04/2021 WOUND #1: - Malleolus Wound Laterality: Right, Medial Cleanser: Normal Saline (Generic) Every Other Day/30 Days Discharge Instructions: Cleanse the wound with Normal Saline prior to applying Yu clean dressing using gauze sponges, not tissue or cotton balls. Cleanser: [employer] Ancillary Kit - 15 Day Supply (Generic) Every Other Day/30 Days Discharge Instructions: Use supplies as instructed; Kit contains: (15) Saline Bullets; (15) 3x3 Gauze; 15 pr Gloves Peri-Wound Care: Skin Prep (DME) (Generic) Every Other Day/30 Days Discharge Instructions: Use skin prep as directed Topical: Skintegrity Hydrogel 4 (oz) (Generic) Every Other Day/30 Days Discharge Instructions: Apply hydrogel as directed Prim Dressing: Promogran Prisma Matrix, 4.34 (sq in) (silver collagen) (Dispense As  Written) Every Other Day/30 Days ary Discharge Instructions: Moisten collagen with saline or hydrogel Secondary Dressing: Zetuvit Plus Silicone Border Dressing 5x5 (in/in) (Dispense As Written) Every Other Day/30 Days Discharge Instructions: Apply silicone border over primary dressing as directed. Secured With: [employer], 4.5x3.1 (in/yd) (DME) (Generic) Every Other Day/30 Days Discharge Instructions: Secure with Kerlix as directed. WOUND #2: - Gluteus Wound Laterality: Right Cleanser: Normal Saline 1 x Per Day/30 Days Discharge Instructions: Cleanse the wound with Normal Saline prior to applying Yu clean dressing using gauze sponges, not tissue or cotton balls. Peri-Wound Care: Skin Prep 1 x Per Day/30 Days Discharge Instructions: Use skin prep as directed Prim Dressing: Dakin's Solution 0.25%, 16 (oz) (Generic) 1 x Per Day/30 Days ary Discharge Instructions: Moisten gauze with Dakin's solution and pack into wound Prim Dressing: Kerlix AMD Roll Dressing, 4.5x 4.1 (in/in) (DME) (Generic) 1 x Per Day/30 Days ary Discharge Instructions: moisten with Dakin's and pack into wound Secondary Dressing: Zetuvit Plus Silicone Border Dressing 5x5 (in/in) (Dispense As Written) 1 x Per Day/30 Days Discharge Instructions: Apply silicone border over primary dressing as directed. WOUND #3: - Foot Wound Laterality: Plantar, Right Prim Dressing: Xeroform Occlusive Gauze Dressing, 4x4 in (DME) (Generic) 3 x Per Week/30 Days ary Discharge Instructions: Apply to wound bed as instructed Secondary Dressing: Woven Gauze Sponge,  Non-Sterile 4x4 in (DME) (Generic) 3 x Per Week/30 Days Discharge Instructions: Apply over primary dressing as directed. Secured With: [employer], 4.5x3.1 (in/yd) (DME) (Generic) 3 x Per Week/30 Days Discharge Instructions: Secure with Kerlix as directed. Secured With: 71M Medipore H Soft Cloth Surgical T ape, 4 x 10 (in/yd) (DME) (Generic) 3 x Per Week/30 Days Andrea Yu, Andrea Yu ([id number]) [id number] Page 8 of 10 Discharge Instructions: Secure with tape as directed. 12/04/2021: The hip wound cavity has light slough over all of the surfaces. Although covered, bone is palpable. The medial ankle wound looks about the same with Yu layer of slough accumulation. In addition, her daughter peeled some dry skin off of her foot this morning and she now has denuded areas over the majority of the plantar surface of her right foot. The slough on the hip wound cavity surface is wiped away with just Yu gauze so no actual debridement took place. I used Yu curette to debride the slough off of the medial ankle wound. No debridement was necessary on the foot. We will pack the hip wound with Dakin's moistened gauze. We will wrap the foot with Xeroform. Continue Prisma silver collagen moistened with hydrogel to the ankle and continue Prevalon boot and offloading. She will complete her course of oral antibiotics. Follow-up in 1 week. Electronic Signature(s) Signed: 12/04/2021 4:11:10 PM By: Fredirick Maudlin MD FACS Entered By: Fredirick Maudlin on 12/04/2021 16:11:10 -------------------------------------------------------------------------------- HxROS Details Patient Name: Date of Service: Andrea Yu 12/04/2021 2:45 PM Medical Record Number: [id number] Patient Account Number: [id number] Date of Birth/Sex: Treating RN: 1940/02/19 (80 y.o. F) Primary Care Provider: Leanna Battles Other Clinician: Referring Provider: Treating Provider/Extender: Kathlyn Sacramento in Treatment: 1 Information Obtained From Patient Caregiver Chart Constitutional Symptoms (General Health) Medical History: Past Medical History Notes: morbid obesity Eyes Medical History: Positive for: Cataracts - bil extracted Negative for: Glaucoma; Optic Neuritis Ear/Nose/Mouth/Throat Medical History: Past Medical History Notes: hard of  hearing Cardiovascular Medical History: Positive for: Arrhythmia - afib; Hypertension Gastrointestinal Medical History: Past Medical History Notes: GERD Endocrine Medical History: Negative for: Type I Diabetes; Type II Diabetes Past Medical History Notes: adrenal insufficiency Genitourinary Medical HistoryZITLALI, Andrea Yu ([id number]) [id number] Page 9 of 10 Negative for: End Stage Renal Disease Integumentary (Skin) Medical History: Negative for: History of Burn Musculoskeletal Medical History: Positive for: Osteoarthritis; Osteomyelitis - pelvic Past Medical History Notes: psoriatic arthritis, thoracic discitis, displaced spiral fx right tibia Neurologic Medical History: Positive for: Neuropathy Past Medical History Notes: hx [facility] Oncologic Medical History: Negative for: Received Chemotherapy; Received Radiation Psychiatric Medical History: Positive for: Confinement Anxiety Negative for: Anorexia/bulimia HBO Extended History Items Eyes: Cataracts Immunizations Pneumococcal Vaccine: Received Pneumococcal Vaccination: Yes Received Pneumococcal Vaccination On or After 60th Birthday: Yes Implantable Devices None Hospitalization / Surgery History Type of Hospitalization/Surgery TEE right ankle fusion bil knee replacements bil cataract extractions posterior lumbar fusion vaginal hysterectomy cholecystectomy Family and Social History Cancer: Yes - Father,Maternal Grandparents,Siblings,Child; Diabetes: No; Heart Disease: Yes - Father; Hereditary Spherocytosis: No; Hypertension: Yes - Father; Kidney Disease: No; Lung Disease: No; Seizures: No; Stroke: No; Thyroid Problems: Yes - Mother; Tuberculosis: No; Former smoker - quit 101 yr ago; Marital Status - Widowed; Alcohol Use: Never; Drug Use: No History; Caffeine Use: Daily - tea, soda; Financial Concerns: No; Food, Clothing or Shelter Needs: No; Support System Lacking: No; Transportation  Concerns: No Electronic Signature(s) Signed: 12/04/2021 4:34:03 PM By: Fredirick Maudlin MD FACS Entered By: Fredirick Maudlin on 12/04/2021  16:02:24 -------------------------------------------------------------------------------- SuperBill Details Patient Name: Date of Service: Andrea Yu 12/04/2021 Andrea Yu ([id number]) [phone].pdf Page 10 of 10 Medical Record Number: [id number] Patient Account Number: [id number] Date of Birth/Sex: Treating RN: [birth year] (81 y.o. F) Primary Care Provider: Leanna Battles Other Clinician: Referring Provider: Treating Provider/Extender: Kathlyn [place] in Treatment: 1 Diagnosis Coding ICD-10 Codes Code Description [phone] Pressure ulcer of right ankle, stage 3 L02.31 Cutaneous abscess of buttock E27.40 Unspecified adrenocortical insufficiency Z79.52 Long term (current) use of systemic steroids I10 Essential (primary) hypertension L97.511 Non-pressure chronic ulcer of other part of right foot limited to breakdown of skin Facility Procedures : CPT4 Code: [id number] Description: 07218 - DEBRIDE WOUND 1ST 20 SQ CM OR < ICD-10 Diagnosis Description 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, stage 3 Modifier: Quantity: 1 Physician Procedures : CPT4 Code Description Modifier [id number] - WC PHYS LEVEL 4 - EST PT 25 ICD-10 Diagnosis Description 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, stage 3 L02.31 Cutaneous abscess of buttock L97.511 Non-pressure chronic ulcer of other part of right foot  limited to breakdown of skin Z79.52 Long term (current) use of systemic steroids Quantity: 1 : [id number] - WC PHYS DEBR WO ANESTH 20 SQ CM ICD-10 Diagnosis Description 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

## 2021-12-04 NOTE — Progress Notes (Signed)
[name], SHIMABUKURO Yu ([id number]) [id number] Page 1 of 9 Visit Report for 12/04/2021 Arrival Information Details Patient Name: Date of Service: Andrea Yu 12/04/2021 2:45 PM Medical Record Number: [id number] Patient Account Number: [id number] Date of Birth/Sex: Treating RN: 03-Jan-1941 (81 y.o. Andrea Yu Primary Care Raistlin Gum: Jarome Matin Other Clinician: Referring Mabelle Mungin: Treating Phillippa Straub/Extender: Marena Chancy in Treatment: 1 Visit Information History Since Last Visit Added or deleted any medications: No Patient Arrived: Wheel Chair Any new allergies or adverse reactions: No Arrival Time: 14:56 Had Yu fall or experienced change in No Accompanied By: daughter activities of daily living that may affect Transfer Assistance: None risk of falls: Patient Identification Verified: Yes Signs or symptoms of abuse/neglect since last visito No Secondary Verification Process Completed: Yes Hospitalized since last visit: No Patient Requires Transmission-Based Precautions: No Implantable device outside of the clinic excluding No Patient Has Alerts: No cellular tissue based products placed in the center since last visit: Has Dressing in Place as Prescribed: Yes Pain Present Now: No Electronic Signature(s) Signed: 12/04/2021 3:57:54 PM By: Samuella Bruin Entered By: Samuella Bruin on 12/04/2021 15:00:18 -------------------------------------------------------------------------------- Lower Extremity Assessment Details Patient Name: Date of Service: Andrea Yu 12/04/2021 2:45 PM Medical Record Number: [id number] Patient Account Number: [id number] Date of Birth/Sex: Treating RN: September 28, 1940 (81 y.o. Andrea Yu Primary Care Andrea Yu: Jarome Matin Other Clinician: Referring Besnik Febus: Treating Claudia Alvizo/Extender: Marena Chancy in Treatment: 1 Edema  Assessment Assessed: [Left: No] [Right: No] Edema: [Left: N] [Right: o] Calf Left: Right: Point of Measurement: From Medial Instep 31.5 cm Ankle Left: Right: Point of Measurement: From Medial Instep 20.5 cm Electronic Signature(s) Signed: 12/04/2021 3:57:54 PM By: Samuella Bruin Entered By: Samuella Bruin on 12/04/2021 15:04:24 [name] ([id number]) [id number] Page 2 of 9 -------------------------------------------------------------------------------- Multi Wound Chart Details Patient Name: Date of Service: Andrea Yu 12/04/2021 2:45 PM Medical Record Number: [id number] Patient Account Number: [id number] Date of Birth/Sex: Treating RN: 09-08-1940 (81 y.o. F) Primary Care Andrea Yu: Jarome Matin Other Clinician: Referring Jakaiya Netherland: Treating Nadie Fiumara/Extender: Marena Chancy in Treatment: 1 Vital Signs Height(in): 59 Pulse(bpm): 68 Weight(lbs): 155 Blood Pressure(mmHg): 143/69 Body Mass Index(BMI): 31.3 Temperature(F): 98.3 Respiratory Rate(breaths/min): 18 [1:Photos:] Right, Medial Malleolus Right Gluteus Right, Plantar Foot Wound Location: Pressure Injury Bump Skin T ear/Laceration Wounding Event: Pressure Ulcer Abscess Abrasion Primary Etiology: Cataracts, Arrhythmia, Hypertension,Cataracts, Arrhythmia, Hypertension, Cataracts, Arrhythmia, Hypertension, Comorbid History: Osteoarthritis, Osteomyelitis, Osteoarthritis, Osteomyelitis, Osteoarthritis, Osteomyelitis, Neuropathy, Confinement Anxiety Neuropathy, Confinement Anxiety Neuropathy, Confinement Anxiety 11/16/2021 09/28/2021 12/03/2021 Date Acquired: 1 1 0 Weeks of Treatment: Open Open Open Wound Status: No No No Wound Recurrence: 1.7x1.5x0.1 1.2x2.5x2.7 11.3x5.5x0.1 Measurements L x W x D (cm) 2.003 2.356 48.812 Yu (cm) : rea 0.2 6.362 4.881 Volume (cm) : -30.10% 77.40% N/Yu % Reduction in Yu rea: -29.90% 79.70% N/Yu % Reduction  in Volume: 12 Starting Position 1 (o'clock): 12 Ending Position 1 (o'clock): 5.5 Maximum Distance 1 (cm): No Yes No Undermining: Category/Stage III Full Thickness With Exposed Support Full Thickness Without Exposed Classification: Structures Support Structures Medium Large Medium Exudate Yu mount: Serosanguineous Purulent Serosanguineous Exudate Type: red, brown yellow, brown, green red, brown Exudate Color: Distinct, outline attached Well defined, not attached Distinct, outline attached Wound Margin: Small (1-33%) Medium (34-66%) Large (67-100%) Granulation Yu mount: Red Red Red Granulation Quality: Large (67-100%) Medium (34-66%) None Present (0%) Necrotic Yu mount: Fat Layer (Subcutaneous Tissue): Yes Fat Layer (Subcutaneous Tissue): Yes Fat Layer (Subcutaneous Tissue): Yes  Exposed Structures: Fascia: No Bone: Yes Fascia: No Tendon: No Fascia: No Tendon: No Muscle: No Tendon: No Muscle: No Joint: No Muscle: No Joint: No Bone: No Joint: No Bone: No None None None Epithelialization: Debridement - Selective/Open Wound N/Yu N/Yu Debridement: Pre-procedure Verification/Time Out 15:25 N/Yu N/Yu Taken: Lidocaine 4% Topical Solution N/Yu N/Yu Pain Control: Slough N/Yu N/Yu Tissue Debrided: Non-Viable Tissue N/Yu N/Yu Level: 2.55 N/Yu N/Yu Debridement Yu (sq cm): rea Curette N/Yu N/Yu Instrument: Minimum N/Yu N/Yu Bleeding: Pressure N/Yu N/Yu Hemostasis Yu chieved: 0 N/Yu N/Yu Procedural Pain: 0 N/Yu N/Yu Post Procedural PainJERRELL, HART Yu ([id number]) [id number] Page 3 of 9 Procedure was tolerated well N/Yu N/Yu Debridement Treatment Response: 1.7x1.5x0.1 N/Yu N/Yu Post Debridement Measurements L x W x D (cm) 0.2 N/Yu N/Yu Post Debridement Volume: (cm) Category/Stage III N/Yu N/Yu Post Debridement Stage: No Abnormalities Noted No Abnormalities Noted No Abnormalities Noted Periwound Skin Texture: No Abnormalities Noted No Abnormalities Noted No  Abnormalities Noted Periwound Skin Moisture: Erythema: Yes Erythema: Yes No Abnormalities Noted Periwound Skin Color: Measured: 1cm N/Yu N/Yu Erythema Measurement: No Abnormality No Abnormality No Abnormality Temperature: N/Yu Yes N/Yu Tenderness on Palpation: Debridement N/Yu N/Yu Procedures Performed: Treatment Notes Electronic Signature(s) Signed: 12/04/2021 3:57:16 PM By: Duanne Guess MD FACS Entered By: Duanne Guess on 12/04/2021 15:57:16 -------------------------------------------------------------------------------- Multi-Disciplinary Care Plan Details Patient Name: Date of Service: Andrea Yu. 12/04/2021 2:45 PM Medical Record Number: [id number] Patient Account Number: [id number] Date of Birth/Sex: Treating RN: Apr 05, 1940 (80 y.o. Andrea Yu Primary Care Arthur Speagle: Jarome Matin Other Clinician: Referring Ephram Kornegay: Treating Tanea Moga/Extender: Marena Chancy in Treatment: 1 Multidisciplinary Care Plan reviewed with physician Active Inactive Abuse / Safety / Falls / Self Care Management Nursing Diagnoses: History of Falls Knowledge deficit related to abuse or neglect Knowledge deficit related to: safety; personal, health (wound), emergency Potential for falls Goals: Patient/caregiver will verbalize/demonstrate measures taken to prevent injury and/or falls Date Initiated: 11/24/2021 Target Resolution Date: 12/22/2021 Goal Status: Active Interventions: Assess fall risk on admission and as needed Assess: immobility, friction, shearing, incontinence upon admission and as needed Assess impairment of mobility on admission and as needed per policy Notes: Pressure Nursing Diagnoses: Knowledge deficit related to causes and risk factors for pressure ulcer development Knowledge deficit related to management of pressures ulcers Potential for impaired tissue integrity related to pressure, friction, moisture, and  shear Goals: Patient/caregiver will verbalize understanding of pressure ulcer management Date Initiated: 11/24/2021 Target Resolution Date: 12/22/2021 Goal Status: Active Interventions: Assess: immobility, friction, shearing, incontinence upon admission and as needed MARYLYNNE, KEELIN Yu ([id number]) [id number] Page 4 of 9 Assess offloading mechanisms upon admission and as needed Assess potential for pressure ulcer upon admission and as needed Provide education on pressure ulcers Treatment Activities: Pressure reduction/relief device ordered : 11/24/2021 Notes: Soft Tissue Infection Nursing Diagnoses: Impaired tissue integrity Potential for infection: soft tissue Goals: Patient's soft tissue infection will resolve Date Initiated: 11/24/2021 Target Resolution Date: 12/22/2021 Goal Status: Active Signs and symptoms of infection will be recognized early to allow for prompt treatment Date Initiated: 11/24/2021 Target Resolution Date: 12/22/2021 Goal Status: Active Interventions: Assess signs and symptoms of infection every visit Provide education on infection Treatment Activities: Culture : 11/24/2021 Culture and sensitivity : 11/24/2021 Systemic antibiotics : 11/24/2021 Notes: Wound/Skin Impairment Nursing Diagnoses: Impaired tissue integrity Knowledge deficit related to ulceration/compromised skin integrity Goals: Patient/caregiver will verbalize understanding of skin care regimen Date Initiated: 11/24/2021 Target Resolution Date: 12/23/2021 Goal Status: Active Ulcer/skin breakdown will have Yu  volume reduction of 30% by week 4 Date Initiated: 11/24/2021 Target Resolution Date: 12/22/2021 Goal Status: Active Interventions: Assess patient/caregiver ability to obtain necessary supplies Assess patient/caregiver ability to perform ulcer/skin care regimen upon admission and as needed Assess ulceration(s) every visit Provide education on ulcer and skin  care Treatment Activities: Skin care regimen initiated : 11/24/2021 Topical wound management initiated : 11/24/2021 Notes: Electronic Signature(s) Signed: 12/04/2021 3:57:54 PM By: Samuella Bruin Entered By: Samuella Bruin on 12/04/2021 15:17:07 -------------------------------------------------------------------------------- Pain Assessment Details Patient Name: Date of Service: Andrea Yu 12/04/2021 2:45 PM Medical Record Number: [id number] Patient Account Number: [id number] MAYKAYLA, HIGHLEY ([id number]) [id number] Page 5 of 9 Date of Birth/Sex: Treating RN: 11/08/1940 (81 y.o. Andrea Yu Primary Care Celica Kotowski: Other Clinician: Jarome Matin Referring Tabitha Tupper: Treating Duan Scharnhorst/Extender: Marena Chancy in Treatment: 1 Active Problems Location of Pain Severity and Description of Pain Patient Has Paino No Site Locations Rate the pain. Current Pain Level: 0 Pain Management and Medication Current Pain Management: Electronic Signature(s) Signed: 12/04/2021 3:57:54 PM By: Samuella Bruin Entered By: Samuella Bruin on 12/04/2021 15:00:27 -------------------------------------------------------------------------------- Patient/Caregiver Education Details Patient Name: Date of Service: Andrea Yu 11/10/2023andnbsp2:45 PM Medical Record Number: [id number] Patient Account Number: [id number] Date of Birth/Gender: Treating RN: Sep 09, 1940 (81 y.o. Andrea Yu Primary Care Physician: Jarome Matin Other Clinician: Referring Physician: Treating Physician/Extender: Marena Chancy in Treatment: 1 Education Assessment Education Provided To: Patient Education Topics Provided Infection: Methods: Explain/Verbal Responses: Reinforcements needed, State content correctly Wound/Skin Impairment: Methods: Explain/Verbal Responses: Reinforcements needed, State  content correctly Electronic Signature(s) Signed: 12/04/2021 3:57:54 PM By: Samuella Bruin Entered By: Samuella Bruin on 12/04/2021 15:17:57 [name] ([id number]) [id number] Page 6 of 9 -------------------------------------------------------------------------------- Wound Assessment Details Patient Name: Date of Service: Andrea Yu 12/04/2021 2:45 PM Medical Record Number: [id number] Patient Account Number: [id number] Date of Birth/Sex: Treating RN: 1940/07/20 (81 y.o. Andrea Yu Primary Care Cresta Riden: Jarome Matin Other Clinician: Referring Kenedie Dirocco: Treating Rainn Bullinger/Extender: Marena Chancy in Treatment: 1 Wound Status Wound Number: 1 Primary Pressure Ulcer Etiology: Wound Location: Right, Medial Malleolus Wound Open Wounding Event: Pressure Injury Status: Date Acquired: 11/16/2021 Comorbid Cataracts, Arrhythmia, Hypertension, Osteoarthritis, Weeks Of Treatment: 1 History: Osteomyelitis, Neuropathy, Confinement Anxiety Clustered Wound: No Photos Wound Measurements Length: (cm) 1.7 Width: (cm) 1.5 Depth: (cm) 0.1 Area: (cm) 2.003 Volume: (cm) 0.2 % Reduction in Area: -30.1% % Reduction in Volume: -29.9% Epithelialization: None Tunneling: No Undermining: No Wound Description Classification: Category/Stage III Wound Margin: Distinct, outline attached Exudate Amount: Medium Exudate Type: Serosanguineous Exudate Color: red, brown Foul Odor After Cleansing: No Slough/Fibrino Yes Wound Bed Granulation Amount: Small (1-33%) Exposed Structure Granulation Quality: Red Fascia Exposed: No Necrotic Amount: Large (67-100%) Fat Layer (Subcutaneous Tissue) Exposed: Yes Necrotic Quality: Adherent Slough Tendon Exposed: No Muscle Exposed: No Joint Exposed: No Bone Exposed: No Periwound Skin Texture Texture Color No Abnormalities Noted: Yes No Abnormalities Noted: No Erythema:  Yes Moisture Erythema Measurement: Measured No Abnormalities Noted: Yes 1 cm Temperature / Pain Temperature: No Abnormality Electronic Signature(s) Signed: 12/04/2021 3:57:54 PM By: Jene Every, Signed: ([id number]) January Yu 12/04/2021 3:57:54 PM By: Jackqulyn Livings.pdf Page 7 of 9 aylor Entered By: Samuella Bruin on 12/04/2021 15:07:45 -------------------------------------------------------------------------------- Wound Assessment Details Patient Name: Date of Service: Andrea Yu 12/04/2021 2:45 PM Medical Record Number: [id number] Patient Account Number: [id number] Date of Birth/Sex: Treating RN: Aug 26, 1940 (81 y.o. Andrea Yu Primary Care Kainalu Heggs:  Jarome Matin Other Clinician: Referring Wilhelmena Zea: Treating Keymarion Bearman/Extender: Marena Chancy in Treatment: 1 Wound Status Wound Number: 2 Primary Abscess Etiology: Wound Location: Right Gluteus Wound Open Wounding Event: Bump Status: Date Acquired: 09/28/2021 Comorbid Cataracts, Arrhythmia, Hypertension, Osteoarthritis, Weeks Of Treatment: 1 History: Osteomyelitis, Neuropathy, Confinement Anxiety Clustered Wound: No Photos Wound Measurements Length: (cm) 1.2 Width: (cm) 2.5 Depth: (cm) 2.7 Area: (cm) 2.356 Volume: (cm) 6.362 % Reduction in Area: 77.4% % Reduction in Volume: 79.7% Epithelialization: None Undermining: Yes Starting Position (o'clock): 12 Ending Position (o'clock): 12 Maximum Distance: (cm) 5.5 Wound Description Classification: Full Thickness With Exposed Support Structures Wound Margin: Well defined, not attached Exudate Amount: Large Exudate Type: Purulent Exudate Color: yellow, brown, green Foul Odor After Cleansing: No Slough/Fibrino No Wound Bed Granulation Amount: Medium (34-66%) Exposed Structure Granulation Quality: Red Fascia Exposed: No Necrotic Amount: Medium (34-66%) Fat Layer  (Subcutaneous Tissue) Exposed: Yes Necrotic Quality: Adherent Slough Tendon Exposed: No Muscle Exposed: No Joint Exposed: No Bone Exposed: Yes Periwound Skin Texture Texture Color No Abnormalities Noted: Yes No Abnormalities Noted: Yes Moisture Temperature / Pain No Abnormalities Noted: Yes Temperature: No Abnormality Tenderness on Palpation: Yes GEARLDEAN, LOMANTO Yu ([id number]) [id number] Page 8 of 9 Electronic Signature(s) Signed: 12/04/2021 3:57:54 PM By: Gelene Mink By: Samuella Bruin on 12/04/2021 15:15:27 -------------------------------------------------------------------------------- Wound Assessment Details Patient Name: Date of Service: Andrea Yu 12/04/2021 2:45 PM Medical Record Number: [id number] Patient Account Number: [id number] Date of Birth/Sex: Treating RN: 05-29-1940 (81 y.o. Andrea Yu Primary Care Lakiesha Ralphs: Jarome Matin Other Clinician: Referring Lilah Mijangos: Treating Armanie Ullmer/Extender: Marena Chancy in Treatment: 1 Wound Status Wound Number: 3 Primary Abrasion Etiology: Wound Location: Right, Plantar Foot Wound Open Wounding Event: Skin Tear/Laceration Status: Date Acquired: 12/03/2021 Comorbid Cataracts, Arrhythmia, Hypertension, Osteoarthritis, Weeks Of Treatment: 0 History: Osteomyelitis, Neuropathy, Confinement Anxiety Clustered Wound: No Photos Wound Measurements Length: (cm) 11.3 Width: (cm) 5.5 Depth: (cm) 0.1 Area: (cm) 48.812 Volume: (cm) 4.881 % Reduction in Area: % Reduction in Volume: Epithelialization: None Tunneling: No Undermining: No Wound Description Classification: Full Thickness Without Exposed Support Structures Wound Margin: Distinct, outline attached Exudate Amount: Medium Exudate Type: Serosanguineous Exudate Color: red, brown Foul Odor After Cleansing: No Slough/Fibrino No Wound Bed Granulation Amount: Large (67-100%) Exposed  Structure Granulation Quality: Red Fascia Exposed: No Necrotic Amount: None Present (0%) Fat Layer (Subcutaneous Tissue) Exposed: Yes Tendon Exposed: No Muscle Exposed: No Joint Exposed: No Bone Exposed: No Periwound Skin Texture Texture Color No Abnormalities Noted: Yes No Abnormalities Noted: Yes Moisture Temperature / Pain No Abnormalities Noted: Yes Temperature: No Abnormality HENSLEY, AZIZ Yu ([id number]) [id number] Page 9 of 9 Electronic Signature(s) Signed: 12/04/2021 3:57:54 PM By: Gelene Mink By: Samuella Bruin on 12/04/2021 15:08:40 -------------------------------------------------------------------------------- Vitals Details Patient Name: Date of Service: Andrea Yu 12/04/2021 2:45 PM Medical Record Number: [id number] Patient Account Number: [id number] Date of Birth/Sex: Treating RN: [birth year] (81 y.o. Andrea Yu Primary Care Caleigha Zale: Jarome Matin Other Clinician: Referring Nargis Abrams: Treating Karry Causer/Extender: Marena Chancy in Treatment: 1 Vital Signs Time Taken: 15:00 Temperature (F): 98.3 Height (in): 59 Pulse (bpm): 68 Weight (lbs): 155 Respiratory Rate (breaths/min): 18 Body Mass Index (BMI): 31.3 Blood Pressure (mmHg): 143/69 Reference Range: 80 - 120 mg / dl Electronic Signature(s) Signed: 12/04/2021 3:57:54 PM By: Samuella Bruin Entered By: Samuella Bruin on 12/04/2021 15:01:20

## 2021-12-14 ENCOUNTER — Encounter (HOSPITAL_BASED_OUTPATIENT_CLINIC_OR_DEPARTMENT_OTHER): Payer: Medicare Other | Admitting: General Surgery

## 2021-12-25 ENCOUNTER — Encounter (HOSPITAL_BASED_OUTPATIENT_CLINIC_OR_DEPARTMENT_OTHER): Payer: Medicare Other | Admitting: General Surgery

## 2022-01-01 ENCOUNTER — Encounter (HOSPITAL_BASED_OUTPATIENT_CLINIC_OR_DEPARTMENT_OTHER): Payer: Medicare Other | Attending: General Surgery | Admitting: General Surgery

## 2022-01-01 DIAGNOSIS — L89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, stage 3: Secondary | ICD-10-CM | POA: Diagnosis present

## 2022-01-01 DIAGNOSIS — E2740 Unspecified adrenocortical insufficiency: Secondary | ICD-10-CM | POA: Insufficient documentation

## 2022-01-01 DIAGNOSIS — G629 Polyneuropathy, unspecified: Secondary | ICD-10-CM | POA: Insufficient documentation

## 2022-01-01 DIAGNOSIS — L89522 Pressure ulcer of left ankle, stage 2: Secondary | ICD-10-CM | POA: Insufficient documentation

## 2022-01-01 DIAGNOSIS — L98415 Non-pressure chronic ulcer of buttock with muscle involvement without evidence of necrosis: Secondary | ICD-10-CM | POA: Diagnosis not present

## 2022-01-01 DIAGNOSIS — F419 Anxiety disorder, unspecified: Secondary | ICD-10-CM | POA: Insufficient documentation

## 2022-01-01 DIAGNOSIS — Z7952 Long term (current) use of systemic steroids: Secondary | ICD-10-CM | POA: Diagnosis not present

## 2022-01-01 DIAGNOSIS — I4891 Unspecified atrial fibrillation: Secondary | ICD-10-CM | POA: Diagnosis not present

## 2022-01-01 DIAGNOSIS — M1990 Unspecified osteoarthritis, unspecified site: Secondary | ICD-10-CM | POA: Diagnosis not present

## 2022-01-02 NOTE — Progress Notes (Signed)
Andrea [name] ([id number]) [id number] Page 1 of 12 Visit Report for 01/01/2022 Chief Complaint Document Details Patient Name: Date of Service: Andrea [name] 01/01/2022 3:00 PM Medical Record Number: [id number] Patient Account Number: [id number] Date of Birth/Sex: Treating RN: 1940-09-18 (81 y.o. F) Primary Care Provider: Leanna Battles Other Clinician: Referring Provider: Treating Provider/Extender: Kathlyn [place] in Treatment: 5 Information Obtained from: Patient Chief Complaint Patient is at the clinic for treatment of an open pressure ulcer on her right medial ankle, and [name] large abscess on her right buttock Electronic Signature(s) Signed: 01/01/2022 3:49:43 PM By: Fredirick Maudlin MD FACS Entered By: Fredirick Maudlin on 01/01/2022 15:49:43 -------------------------------------------------------------------------------- Debridement Details Patient Name: Date of Service: Andrea [name]. 01/01/2022 3:00 PM Medical Record Number: [id number] Patient Account Number: [id number] Date of Birth/Sex: Treating RN: Jun 20, 1940 (81 y.o. Andrea [name] Primary Care Provider: Leanna Battles Other Clinician: Referring Provider: Treating Provider/Extender: Kathlyn [place] in Treatment: 5 Debridement Performed for Assessment: Wound #4 Left,Lateral Ankle Performed By: Physician Fredirick Maudlin, MD Debridement Type: Debridement Level of Consciousness (Pre-procedure): Awake and Alert Pre-procedure Verification/Time Out Yes - 15:21 Taken: Start Time: 15:21 Pain Control: Lidocaine 4% T opical Solution T Area Debrided (L x W): otal 0.6 (cm) x 0.3 (cm) = 0.18 (cm) Tissue and other material debrided: Non-Viable, Slough, Slough Level: Non-Viable Tissue Debridement Description: Selective/Open Wound Instrument: Curette Bleeding: Minimum Hemostasis Achieved: Pressure Response to Treatment: Procedure was  tolerated well Level of Consciousness (Post- Awake and Alert procedure): Post Debridement Measurements of Total Wound Length: (cm) 0.6 Stage: Category/Stage II Width: (cm) 0.3 Depth: (cm) 0.1 Volume: (cm) 0.014 Character of Wound/Ulcer Post Debridement: Improved Post Procedure Diagnosis Same as Pre-procedure Andrea [name], Andrea [name] ([id number]) [id number] Page 2 of 12 Notes scribed for Dr. Celine Ahr by Adline Peals, RN Electronic Signature(s) Signed: 01/01/2022 4:00:18 PM By: Fredirick Maudlin MD FACS Signed: 01/01/2022 4:13:22 PM By: Adline Peals Entered By: Adline Peals on 01/01/2022 15:22:17 -------------------------------------------------------------------------------- Debridement Details Patient Name: Date of Service: Andrea [name]. 01/01/2022 3:00 PM Medical Record Number: [id number] Patient Account Number: [id number] Date of Birth/Sex: Treating RN: Mar 01, 1940 (81 y.o. Andrea [name] Primary Care Provider: Leanna Battles Other Clinician: Referring Provider: Treating Provider/Extender: Kathlyn [place] in Treatment: 5 Debridement Performed for Assessment: Wound #1 Right,Medial Malleolus Performed By: Physician Fredirick Maudlin, MD Debridement Type: Debridement Level of Consciousness (Pre-procedure): Awake and Alert Pre-procedure Verification/Time Out Yes - 15:21 Taken: Start Time: 15:21 Pain Control: Lidocaine 4% T opical Solution T Area Debrided (L x W): otal 1.4 (cm) x 0.9 (cm) = 1.26 (cm) Tissue and other material debrided: Non-Viable, Slough, Slough Level: Non-Viable Tissue Debridement Description: Selective/Open Wound Instrument: Curette Bleeding: Minimum Hemostasis Achieved: Pressure Response to Treatment: Procedure was tolerated well Level of Consciousness (Post- Awake and Alert procedure): Post Debridement Measurements of Total Wound Length: (cm) 1.4 Stage: Category/Stage III Width:  (cm) 0.9 Depth: (cm) 0.1 Volume: (cm) 0.099 Character of Wound/Ulcer Post Debridement: Improved Post Procedure Diagnosis Same as Pre-procedure Notes scribed for Dr. Celine Ahr by Adline Peals, RN Electronic Signature(s) Signed: 01/01/2022 4:00:18 PM By: Fredirick Maudlin MD FACS Signed: 01/01/2022 4:13:22 PM By: Adline Peals Entered By: Adline Peals on 01/01/2022 15:22:42 -------------------------------------------------------------------------------- HPI Details Patient Name: Date of Service: Andrea [name]. 01/01/2022 3:00 PM Medical Record Number: [id number] Patient Account Number: [id number] Date of Birth/Sex: Treating RN: October 07, 1940 (81 y.o. 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 She is not diabetic and quit smoking over 40 years ago. 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 She has developed [name] pressure ulcer on the right medial malleolus. In addition, she apparently has had multiple cutaneous abscesses on her buttocks that have required repeated incision and drainage procedures. She has developed [name] large abscess on her right buttock that has become painful. She was recently treated with cephalexin by her PCP for urinary tract infection and also received [name] shot of Rocephin for the abscess, but it has not yet been incised or drained. She is nonambulatory but is able to stand to transfer. She does not wear any sort of Prevalon boot. ABI in clinic today was 0.96. On her right medial malleolus, there is [name] circular ulcer. The  surface is dry and fibrotic. There is some periwound erythema, but no malodor or purulent drainage. On her right buttock, there is [name] large fluctuant bulge about the size of an orange. It is also erythematous and tender. 12/04/2021: The culture from her abscess grew out staph. [name] 10-day course of Bactrim was prescribed and she is currently taking this. Unfortunately, Dakin's solution was not delivered and only 2 x 2 gauze was sent, rather than Kerlix so the packing of the wound has been rather suboptimal. The wound cavity has light slough over all of the surfaces. Although covered, bone is palpable. The medial ankle wound looks about the same with [name] layer of slough accumulation. In addition, her daughter peeled some dry skin off of her foot this morning and she now has denuded areas over the majority of the plantar surface of her right foot. 01/01/2022: The patient has missed several visits and to her daughters report that it is somewhat difficult to get her here on [name] weekly basis. In the interim, she has developed [name] new pressure ulcer on her left lateral malleolus. The plantar surface of her right foot has healed. The cavity on her right buttock has contracted, but it remains quite deep and the trochanter, while not exposed, is palpable at the base. The pressure ulcer on her right medial malleolus is basically unchanged. Electronic Signature(s) Signed: 01/01/2022 3:52:12 PM By: Fredirick Maudlin MD FACS Entered By: Fredirick Maudlin on 01/01/2022 15:52:12 -------------------------------------------------------------------------------- Physical Exam Details Patient Name: Date of Service: Andrea [name]. 01/01/2022 3:00 PM Medical Record Number: [id number] Patient Account Number: [id number] Date of Birth/Sex: Treating RN: Mar 28, 1940 (80 y.o. F) Primary Care Provider: Leanna Battles Other Clinician: Referring Provider: Treating Provider/Extender: Kathlyn [place] in  Treatment: 5 Constitutional Slightly hypertensive. Slightly tachycardic, asymptomatic.. . . No acute distress. Respiratory Normal work of breathing on room air. Notes 01/01/2022: She has developed [name] new pressure ulcer on her left lateral malleolus. The plantar surface of her right foot has healed. The cavity on her right buttock has contracted, but it remains quite deep and the trochanter, while not exposed, is palpable at the base. The pressure ulcer on her right medial malleolus is basically unchanged. Electronic Signature(s) Signed: 01/01/2022 3:53:03 PM By: Fredirick Maudlin MD FACS Entered By: Fredirick Maudlin on 01/01/2022 15:53:03 -------------------------------------------------------------------------------- Physician Orders Details Patient Name: Date of Service: Andrea [name]. 01/01/2022 3:00 PM  Medical Record Number: [id number] Patient Account Number: [id number] Andrea [name], Andrea [name] ([id number]) [phone].pdf Page 4 of 12 Date of Birth/Sex: Treating RN: 1940-02-07 (81 y.o. 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 - Dr. Celine Ahr - room 1 Return [name] Anesthetic (In clinic) Topical Lidocaine 4% applied to wound bed Bathing/ Shower/ Hygiene May shower and wash wound with soap and water. Negative Presssure Wound Therapy Other: - run  insurance for wound vac Off-Loading Multipodus Splint to: - prevalon boot to both feet Turn and reposition every 2 hours [place] wound care orders this week; continue [place] for wound care. May utilize formulary equivalent dressing for wound treatment orders unless otherwise specified. Dressing changes to be completed by [facility] on Monday / Wednesday / Friday except when patient has scheduled visit at [facility]. Other Home Health Orders/Instructions: - [facility] Wound Treatment Wound #1 - Malleolus Wound Laterality: Right, Medial Cleanser: Normal Saline (Generic) Every Other Day/30 Days Discharge Instructions: Cleanse the wound with Normal Saline prior to applying [name] clean dressing using gauze sponges, not tissue or cotton balls. Cleanser: [employer] Ancillary Kit - 15 Day Supply (Generic) Every Other Day/30 Days Discharge Instructions: Use supplies as instructed; Kit contains: (15) Saline Bullets; (15) 3x3 Gauze; 15 pr Gloves Peri-Wound Care: Skin Prep (Generic) Every Other Day/30 Days Discharge Instructions: Use skin prep as directed Topical: Skintegrity Hydrogel 4 (oz) (Generic) Every Other Day/30 Days Discharge Instructions: Apply hydrogel as directed Prim Dressing: Promogran Prisma Matrix, 4.34 (sq in) (silver collagen) (Dispense As Written) Every Other Day/30 Days ary Discharge Instructions: Moisten collagen with saline or hydrogel Secondary Dressing: Zetuvit Plus Silicone Border Dressing 5x5 (in/in) (Dispense As Written) Every Other Day/30 Days Discharge Instructions: Apply silicone border over primary dressing as directed. Secured With: [employer], 4.5x3.1 (in/yd) (Generic) Every Other Day/30 Days Discharge Instructions: Secure with Kerlix as directed. Wound #2 - Gluteus Wound Laterality: Right Cleanser: Normal Saline 1 x Per Day/30 Days Discharge Instructions: Cleanse the wound with Normal Saline prior to applying [name] clean dressing using gauze sponges,  not tissue or cotton balls. Peri-Wound Care: Skin Prep 1 x Per Day/30 Days Discharge Instructions: Use skin prep as directed Prim Dressing: Dakin's Solution 0.25%, 16 (oz) (Generic) 1 x Per Day/30 Days ary Discharge Instructions: Moisten gauze with Dakin's solution and pack into wound Prim Dressing: Kerlix AMD Roll Dressing, 4.5x 4.1 (in/in) (Generic) 1 x Per Day/30 Days NIJAE, Andrea [name] ([id number]) [id number] Page 5 of 12 Discharge Instructions: moisten with Dakin's and pack into wound Secondary Dressing: Zetuvit Plus Silicone Border Dressing 5x5 (in/in) (Dispense As Written) 1 x Per Day/30 Days Discharge Instructions: Apply silicone border over primary dressing as directed. Wound #4 - Ankle Wound Laterality: Left, Lateral Cleanser: Normal Saline (Generic) Every Other Day/30 Days Discharge Instructions: Cleanse the wound with Normal Saline prior to applying [name] clean dressing using gauze sponges, not tissue or cotton balls. Cleanser: Byram Ancillary Kit - 15 Day Supply (Generic) Every Other Day/30 Days Discharge Instructions: Use supplies as instructed; Kit contains: (  15) Saline Bullets; (15) 3x3 Gauze; 15 pr Gloves Peri-Wound Care: Skin Prep (Generic) Every Other Day/30 Days Discharge Instructions: Use skin prep as directed Topical: Skintegrity Hydrogel 4 (oz) (Generic) Every Other Day/30 Days Discharge Instructions: Apply hydrogel as directed Prim Dressing: Promogran Prisma Matrix, 4.34 (sq in) (silver collagen) (Dispense As Written) Every Other Day/30 Days ary Discharge Instructions: Moisten collagen with saline or hydrogel Secondary Dressing: Zetuvit Plus Silicone Border Dressing 5x5 (in/in) (Dispense As Written) Every Other Day/30 Days Discharge Instructions: Apply silicone border over primary dressing as directed. Secured With: [employer], 4.5x3.1 (in/yd) (Generic) Every Other Day/30 Days Discharge Instructions: Secure with Kerlix as  directed. Patient Medications llergies: penicillin, latex, Prolia, ceftriaxone, ciprofloxacin, Fosamax, lisinopril, tuberculin,PPD,multi-puncture [name] Notifications Medication Indication Start End 01/01/2022 lidocaine DOSE topical 4 % cream - cream topical Electronic Signature(s) Signed: 01/01/2022 4:00:18 PM By: Fredirick Maudlin MD FACS Entered By: Fredirick Maudlin on 01/01/2022 15:53:42 -------------------------------------------------------------------------------- Problem List Details Patient Name: Date of Service: Andrea [name]. 01/01/2022 3:00 PM Medical Record Number: [id number] Patient Account Number: [id number] Date of Birth/Sex: Treating RN: Dec 04, 1940 (80 y.o. 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 without 01/01/2022 No Yes evidence of necrosis Andrea [name], Andrea [name] ([id number]) [id number].pdf Page 6 of 12 L02.31 Cutaneous abscess of buttock 11/24/2021 No Yes E27.40 Unspecified adrenocortical insufficiency 11/24/2021 No Yes Z79.52 Long term (current) use of systemic steroids 11/24/2021 No Yes I10 Essential (primary) hypertension 11/24/2021 No Yes Inactive Problems Resolved Problems ICD-10 Code Description Active Date Resolved Date L97.511 Non-pressure chronic ulcer of other part of right foot limited to breakdown of skin 12/04/2021 12/04/2021 Electronic Signature(s) Signed: 01/01/2022 3:47:01 PM By: Fredirick Maudlin MD FACS Entered By: Fredirick Maudlin on 01/01/2022  15:47:01 -------------------------------------------------------------------------------- Progress Note Details Patient Name: Date of Service: Andrea [name]. 01/01/2022 3:00 PM Medical Record Number: [id number] Patient Account Number: [id number] Date of Birth/Sex: Treating RN: 1940/10/04 (80 y.o. 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 She is not diabetic and quit smoking over 40 years ago. 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 She has developed [name] pressure ulcer on the right medial malleolus. In addition, she apparently has had multiple cutaneous abscesses on her buttocks that have required repeated incision and drainage procedures. She has developed [name] large abscess on her right buttock that has become painful. She was recently treated with cephalexin by her PCP for urinary tract infection and also received [name] shot of Rocephin for the abscess, but it has not yet been incised or drained. She is nonambulatory but is able to stand to transfer. She does not wear any sort of Prevalon boot. ABI in clinic today was 0.96. On her right medial malleolus, there is [name] circular ulcer. The  surface is dry and fibrotic. There is some periwound erythema, but no malodor or purulent drainage. On her right buttock, there is [name] large fluctuant bulge about the size of an orange. It is also erythematous and tender. 12/04/2021: The culture from her abscess grew out staph.  [name] 10-day course of Bactrim was prescribed and she is currently taking this. Unfortunately, Dakin's solution was not delivered and only 2 x 2 gauze was sent, rather than Kerlix so the packing of the wound has been rather suboptimal. The wound cavity has light slough over all of the surfaces. Although covered, bone is palpable. The medial ankle wound looks about the same with [name] layer of slough accumulation. In addition, her daughter peeled some dry skin off of her foot this morning and she now has denuded areas over the majority of the plantar surface of her right foot. 01/01/2022: The patient has missed several visits and to her daughters report that it is somewhat difficult to get her here on [name] weekly basis. In the interim, she has developed [name] new pressure ulcer on her left lateral malleolus. The plantar surface of her right foot has healed. The cavity on her right buttock has contracted, but it remains quite deep and the trochanter, while not exposed, is palpable at the base. The pressure ulcer on her right medial malleolus is basically Andrea [name], Andrea [name] [name] ([id number]) [id number] Page 7 of 12 unchanged. Patient History Information obtained from Patient, Caregiver, Chart. Family History Cancer - Father,Maternal Grandparents,Siblings,Child, Heart Disease - Father, Hypertension - Father, Thyroid Problems - Mother, No family history of Diabetes, Hereditary Spherocytosis, Kidney Disease, Lung Disease, Seizures, Stroke, Tuberculosis. Social History Former smoker - quit 57 yr ago, Marital Status - Widowed, Alcohol Use - Never, Drug Use - No History, Caffeine Use - Daily - tea, soda. Medical History Eyes Patient has history of Cataracts - bil extracted Denies history of Glaucoma, Optic Neuritis Cardiovascular Patient has history of Arrhythmia - afib, Hypertension Endocrine Denies history of Type I Diabetes, Type II Diabetes Genitourinary Denies history of End Stage Renal  Disease Integumentary (Skin) Denies history of History of Burn Musculoskeletal Patient has history of Osteoarthritis, Osteomyelitis - pelvic Neurologic Patient has history of Neuropathy Oncologic Denies history of Received Chemotherapy, Received Radiation Psychiatric Patient has history of Confinement Anxiety Denies history of Anorexia/bulimia Hospitalization/Surgery History - TEE. - right ankle fusion. - bil knee replacements. - bil cataract extractions. - posterior lumbar fusion. - vaginal hysterectomy. - cholecystectomy. 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 Slightly tachycardic, asymptomatic.Marland Kitchen No acute distress. Vitals Time Taken: 3:02 PM, Height: 59 in, Weight: 155 lbs, BMI: 31.3, Temperature: 98.5 F, Pulse: 106 bpm, Respiratory Rate: 16 breaths/min, Blood Pressure: 141/81 mmHg. Respiratory Normal work of breathing on room air. General Notes: 01/01/2022: She has developed [name] new pressure ulcer on her left lateral malleolus. The plantar surface of her right foot has healed. The cavity on her right buttock has contracted, but it remains quite deep and the trochanter, while not exposed, is palpable at the base. The pressure ulcer on her right medial malleolus is basically unchanged. Integumentary (Hair, Skin) Wound #1 status is Open. Original cause of wound was Pressure Injury. The date acquired was: 11/16/2021. The wound has been in treatment 5 weeks. The wound is located on the Right,Medial Malleolus. The wound measures 1.4cm  length x 0.9cm width x 0.1cm depth; 0.99cm^2 area and 0.099cm^3 volume. There is Fat Layer (Subcutaneous Tissue) exposed. There is no tunneling or  undermining noted. There is [name] medium amount of serosanguineous drainage noted. The wound margin is distinct with the outline attached to the wound base. There is small (1-33%) red granulation within the wound bed. There is [name] large (67-100%) amount of necrotic tissue within the wound bed including Adherent Slough. The periwound skin appearance had no abnormalities noted for texture. The periwound skin appearance had no abnormalities noted for moisture. The periwound skin appearance exhibited: Erythema. The surrounding wound skin color is noted with erythema. Erythema is measured at 1 cm. Periwound temperature was noted as No Abnormality. Wound #2 status is Open. Original cause of wound was Bump. The date acquired was: 09/28/2021. The wound has been in treatment 5 weeks. The wound is located on the Right Gluteus. The wound measures 1.5cm length x 2.4cm width x 3cm depth; 2.827cm^2 area and 8.482cm^3 volume. There is bone and Fat Layer (Subcutaneous Tissue) exposed. There is no tunneling noted, however, there is undermining starting at 1:00 and ending at 4:00 with [name] maximum distance of 4.4cm. There is [name] large amount of serosanguineous drainage noted. The wound margin is well defined and not attached to the wound base. There is large ([address]AILAH, BARNA [name] ([id number]) [id number].pdf Page 8 of 12 100%) red granulation within the wound bed. There is [name] small (1-33%) amount of necrotic tissue within the wound bed including Adherent Slough. The periwound skin appearance had no abnormalities noted for texture. The periwound skin appearance had no abnormalities noted for moisture. The periwound skin appearance had no abnormalities noted for color. Periwound temperature was noted as No Abnormality. The periwound has tenderness on palpation. Wound #3 status is Open. Original cause of wound was Skin T ear/Laceration. The date acquired was: 12/03/2021. The wound has been in treatment 4 weeks.  The wound is located on the [place]. The wound measures 0cm length x 0cm width x 0cm depth; 0cm^2 area and 0cm^3 volume. There is no tunneling or undermining noted. There is [name] none present amount of drainage noted. The wound margin is flat and intact. There is no granulation within the wound bed. There is no necrotic tissue within the wound bed. The periwound skin appearance had no abnormalities noted for texture. The periwound skin appearance had no abnormalities noted for moisture. The periwound skin appearance had no abnormalities noted for color. Periwound temperature was noted as No Abnormality. Wound #4 status is Open. Original cause of wound was Pressure Injury. The date acquired was: 12/23/2021. The wound is located on the Left,Lateral Ankle. The wound measures 0.6cm length x 0.3cm width x 0.1cm depth; 0.141cm^2 area and 0.014cm^3 volume. There is Fat Layer (Subcutaneous Tissue) exposed. There is no tunneling or undermining noted. There is [name] medium amount of serosanguineous drainage noted. The wound margin is distinct with the outline attached to the wound base. There is small (1-33%) red granulation within the wound bed. There is [name] large (67-100%) amount of necrotic tissue within the wound bed including Adherent Slough. The periwound skin appearance had no abnormalities noted for texture. The periwound skin appearance had no abnormalities noted for moisture. The periwound skin appearance had no abnormalities noted for color. Periwound temperature was noted as No Abnormality. 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 There was [name] Selective/Open Wound Non-Viable Tissue Debridement with [name] total area of 1.26 sq cm performed by Fredirick Maudlin, MD. With the following instrument(s): Curette to remove Non-Viable tissue/material. Material removed includes [facility] after achieving pain control using Lidocaine 4% Topical Solution. No specimens were taken. [name] time out was conducted at 15:21, prior to the start of the procedure. [name] Minimum amount of bleeding was controlled with Pressure. The procedure was tolerated well. Post Debridement Measurements: 1.4cm length x 0.9cm width x 0.1cm depth; 0.099cm^3 volume. Post debridement Stage noted as Category/Stage III. Character of Wound/Ulcer Post Debridement is improved. Post procedure Diagnosis Wound #1: Same as Pre-Procedure General Notes: scribed for Dr. Celine Ahr by Adline Peals, RN. 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 There was [name] Selective/Open Wound Non-Viable Tissue Debridement with [name] total area of 0.18 sq cm performed by Fredirick Maudlin, MD. With the following instrument(s): Curette to remove Non-Viable tissue/material. Material removed includes [facility] after achieving pain control using Lidocaine 4% Topical Solution. No specimens were taken. [name] time out was conducted at 15:21, prior to the start of the procedure. [name] Minimum amount of bleeding was controlled with Pressure. The procedure was tolerated well. Post Debridement Measurements: 0.6cm length x 0.3cm width x 0.1cm depth; 0.014cm^3 volume. Post debridement Stage noted as Category/Stage II. Character of Wound/Ulcer Post Debridement is improved. Post procedure Diagnosis Wound #4: Same as Pre-Procedure General Notes: scribed for Dr. Celine Ahr by Adline Peals, RN. Plan Follow-up Appointments: Return Appointment in 2 weeks. - Dr. Celine Ahr - room 1 Anesthetic: (In clinic) Topical Lidocaine 4%  applied to wound bed Bathing/ Shower/ Hygiene: May shower and wash wound with soap and water. Negative Presssure Wound Therapy: Other: - run insurance for wound vac Off-Loading: Multipodus Splint to: - prevalon boot to both feet Turn and reposition every 2 hours Home Health: New wound care orders this week; continue Home Health for wound care. May utilize formulary equivalent dressing for wound treatment orders unless otherwise specified. Dressing changes to be completed by [place] on Monday / Wednesday / Friday except when patient has scheduled visit at [facility]. Other Home Health Orders/Instructions: - [facility] The following medication(s) was prescribed: lidocaine topical 4 % cream cream topical was prescribed at facility WOUND #1: - Malleolus Wound Laterality: Right, Medial Cleanser: Normal Saline (Generic) Every Other Day/30 Days Discharge Instructions: Cleanse the wound with Normal Saline prior to applying [name] clean dressing using gauze sponges, not tissue or cotton balls. Cleanser: [place] Ancillary Kit - 15 Day Supply (Generic) Every Other Day/30 Days Andrea [name], Andrea [name] ([id number]) [id number] Page 9 of 12 Discharge Instructions: Use supplies as instructed; Kit contains: (15) Saline Bullets; (15) 3x3 Gauze; 15 pr Gloves Peri-Wound Care: Skin Prep (Generic) Every Other Day/30 Days Discharge Instructions: Use skin prep as directed Topical: Skintegrity Hydrogel 4 (oz) (Generic) Every Other Day/30 Days Discharge Instructions: Apply hydrogel as directed Prim Dressing: Promogran Prisma Matrix, 4.34 (sq in) (silver collagen) (Dispense As Written) Every Other Day/30 Days ary Discharge Instructions: Moisten collagen with saline or hydrogel Secondary Dressing: Zetuvit Plus Silicone Border Dressing 5x5 (in/in) (Dispense As Written) Every Other Day/30 Days Discharge Instructions: Apply silicone border over primary dressing as directed. Secured With: [employer], 4.5x3.1 (in/yd) (Generic)  Every Other Day/30 Days Discharge Instructions: Secure with Kerlix as directed. WOUND #2: - Gluteus Wound Laterality: Right Cleanser: Normal Saline 1 x Per Day/30 Days Discharge Instructions: Cleanse the wound with Normal Saline prior to applying [name] clean dressing using gauze sponges, not tissue or cotton balls. Peri-Wound Care: Skin Prep 1 x Per Day/30 Days Discharge Instructions: Use skin prep as directed Prim Dressing: Dakin's Solution 0.25%, 16 (oz) (Generic) 1 x Per Day/30 Days ary Discharge Instructions: Moisten gauze with Dakin's solution and pack into wound Prim Dressing: Kerlix AMD Roll Dressing, 4.5x 4.1 (in/in) (Generic) 1 x Per Day/30 Days ary Discharge Instructions: moisten with Dakin's and pack into wound Secondary Dressing: Zetuvit Plus Silicone Border Dressing 5x5 (in/in) (Dispense As Written) 1 x Per Day/30 Days Discharge Instructions: Apply silicone border over primary dressing as directed. WOUND #4: - Ankle Wound Laterality: Left, Lateral Cleanser: Normal Saline (Generic) Every Other Day/30 Days Discharge Instructions: Cleanse the wound with Normal Saline prior to applying [name] clean dressing using gauze sponges, not tissue or cotton balls. Cleanser: [place] Ancillary Kit - 15 Day Supply (Generic) Every Other Day/30 Days Discharge Instructions: Use supplies as instructed; Kit contains: (15) Saline Bullets; (15) 3x3 Gauze; 15 pr Gloves Peri-Wound Care: Skin Prep (Generic) Every Other Day/30 Days Discharge Instructions: Use skin prep as directed Topical: Skintegrity Hydrogel 4 (oz) (Generic) Every Other Day/30 Days Discharge Instructions: Apply hydrogel as directed Prim Dressing: Promogran Prisma Matrix, 4.34 (sq in) (silver collagen) (Dispense As Written) Every Other Day/30 Days ary Discharge Instructions: Moisten collagen with saline or hydrogel Secondary Dressing: Zetuvit Plus Silicone Border Dressing 5x5 (in/in) (Dispense As Written)  Every Other Day/30 Days Discharge Instructions: Apply silicone border over primary dressing as directed. Secured With: [employer], 4.5x3.1 (in/yd) (Generic) Every Other Day/30 Days Discharge Instructions: Secure with Kerlix as directed. 01/01/2022: She has developed [name] new pressure ulcer on her left lateral malleolus. The plantar surface of her right foot has healed. The cavity on her right buttock has contracted, but it remains quite deep and the trochanter, while not exposed, is palpable at the base. The pressure ulcer on her right medial malleolus is basically unchanged. I used [name] curette to debride slough from both of the malleolus wounds. We will use Prisma silver collagen with hydrogel on both sites. I gave her another Prevalon boot so that she now can wear them bilaterally. Between her ankle contractures and her need to be positioned off of her right hip, I think she is just spending so much time in [name] left lateral decubitus position that she needs to be on [name] low-air-loss mattress surface so we will work on getting that for her. The gluteal/trochanter ulcer did not require debridement today. I think she would benefit from [name] wound VAC so we will work on getting this cleared by her insurance. In the meantime, we will continue to pack it with Dakin's-moistened gauze. She will follow-up in 2 weeks. Electronic Signature(s) Signed: 01/01/2022 3:56:04 PM By: Fredirick Maudlin MD FACS Previous Signature: 01/01/2022 3:55:20 PM Version By: Fredirick Maudlin MD FACS Entered By: Fredirick Maudlin on 01/01/2022 15:56:04 -------------------------------------------------------------------------------- HxROS Details Patient Name: Date of Service: Andrea [name]. 01/01/2022 3:00 PM Medical Record Number: [id number] Patient Account Number: [id number] Date of Birth/Sex: Treating RN: 02/02/1940 (80 y.o. F) Primary Care Provider: Leanna Battles Other Clinician: Referring Provider: Treating  Provider/Extender: Kathlyn Sacramento in Treatment: 5 Information Obtained From Patient Caregiver Chart Constitutional Symptoms (General Health) Medical History: Past Medical History  Notes: morbid obesity Andrea [name], Andrea [name] ([id number]) [id number] Page 10 of 12 Eyes Medical History: Positive for: Cataracts - bil extracted Negative for: Glaucoma; Optic Neuritis Ear/Nose/Mouth/Throat Medical History: Past Medical History Notes: hard of hearing Cardiovascular Medical History: Positive for: Arrhythmia - afib; Hypertension Gastrointestinal Medical History: Past Medical History Notes: GERD Endocrine Medical History: Negative for: Type I Diabetes; Type II Diabetes Past Medical History Notes: adrenal insufficiency Genitourinary Medical History: Negative for: End Stage Renal Disease Integumentary (Skin) Medical History: Negative for: History of Burn Musculoskeletal Medical History: Positive for: Osteoarthritis; Osteomyelitis - pelvic Past Medical History Notes: psoriatic arthritis, thoracic discitis, displaced spiral fx right tibia Neurologic Medical History: Positive for: Neuropathy Past Medical History Notes: hx [facility] Oncologic Medical History: Negative for: Received Chemotherapy; Received Radiation Psychiatric Medical History: Positive for: Confinement Anxiety Negative for: Anorexia/bulimia HBO Extended History Items Eyes: Cataracts Immunizations Pneumococcal Vaccine: Received Pneumococcal Vaccination: Yes Received Pneumococcal Vaccination On or After 60th Birthday: Yes Implantable Devices None Hospitalization / Surgery History Andrea [name], Andrea [name] ([id number]) [id number] Page 11 of 12 Type of Hospitalization/Surgery TEE right ankle fusion bil knee replacements bil cataract extractions posterior lumbar fusion vaginal hysterectomy cholecystectomy Family and Social History Cancer: Yes -  Father,Maternal Grandparents,Siblings,Child; Diabetes: No; Heart Disease: Yes - Father; Hereditary Spherocytosis: No; Hypertension: Yes - Father; Kidney Disease: No; Lung Disease: No; Seizures: No; Stroke: No; Thyroid Problems: Yes - Mother; Tuberculosis: No; Former smoker - quit 17 yr ago; Marital Status - Widowed; Alcohol Use: Never; Drug Use: No History; Caffeine Use: Daily - tea, soda; Financial Concerns: No; Food, Clothing or Shelter Needs: No; Support System Lacking: No; Transportation Concerns: No Engineer, maintenance) Signed: 01/01/2022 4:00:18 PM By: Fredirick Maudlin MD FACS Entered By: Fredirick Maudlin on 01/01/2022 15:52:19 -------------------------------------------------------------------------------- SuperBill Details Patient Name: Date of Service: Andrea [name] 01/01/2022 Medical Record Number: [id number] Patient Account Number: [id number] Date of Birth/Sex: Treating RN: Jun 01, 1940 (80 y.o. 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 L02.31 Cutaneous abscess of buttock E27.40 Unspecified adrenocortical insufficiency Z79.52 Long term (current) use of systemic steroids I10 Essential (primary) hypertension Facility Procedures : CPT4 Code: [id number] Description: 77116 - DEBRIDE WOUND 1ST 20 SQ CM OR < ICD-10 Diagnosis Description 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, stage 3 L89.522 Pressure ulcer of left ankle, stage 2 Modifier: Quantity: 1 Physician Procedures : CPT4 Code Description Modifier [id number] - WC PHYS LEVEL 4 - EST PT 25 ICD-10 Diagnosis Description 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, stage 3  L89.522 Pressure ulcer of left ankle, stage 2 L98.415 Non-pressure chronic ulcer of buttock with  muscle involvement without evidence of necrosis Z79.52 Long term (current) use of systemic steroids Quantity: 1 : [id number] - WC PHYS DEBR WO ANESTH 20 SQ CM ICD-10 Diagnosis Description 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, stage 3 L89.522 Pressure ulcer of left ankle, stage 2 Andrea [name], Andrea [name] ([id number]) [id number] Quantity: 1 Page 12 of 12 Electronic Signature(s) Signed: 01/01/2022 3:56:27 PM By: Fredirick Maudlin MD FACS Entered By: Fredirick Maudlin on 01/01/2022 15:56:27

## 2022-01-02 NOTE — Progress Notes (Signed)
[name], [name] [name] ([id number]) [id number] Page 1 of 12 Visit Report for 01/01/2022 Arrival Information Details Patient Name: Date of Service: Andrea [name] 01/01/2022 3:00 PM Medical Record Number: [id number] Patient Account Number: [id number] Date of Birth/Sex: Treating RN: 21-Dec-1940 (81 y.o. Andrea [name] Primary Care Andrea [name]: Andrea [name] Other Clinician: Referring Andrea [name]: Treating Andrea [name]/Extender: Andrea [name] in Treatment: 5 Visit Information History Since Last Visit Added or deleted any medications: No Patient Arrived: Wheel Chair Any new allergies or adverse reactions: No Arrival Time: 14:57 Had [name] fall or experienced change in No Accompanied By: daughter activities of daily living that may affect Transfer Assistance: None risk of falls: Patient Identification Verified: Yes Signs or symptoms of abuse/neglect since last visito No Secondary Verification Process Completed: Yes Hospitalized since last visit: No Patient Requires Transmission-Based Precautions: No Implantable device outside of the clinic excluding No Patient Has Alerts: No cellular tissue based products placed in the center since last visit: Has Dressing in Place as Prescribed: Yes Pain Present Now: No Electronic Signature(s) Signed: 01/01/2022 4:13:22 PM By: Andrea [name] Entered By: Andrea [name] on 01/01/2022 15:01:41 -------------------------------------------------------------------------------- Encounter Discharge Information Details Patient Name: Date of Service: Andrea Childes [name] 01/01/2022 3:00 PM Medical Record Number: [id number] Patient Account Number: [id number] Date of Birth/Sex: Treating RN: [birth year] (80 y.o. Andrea [name] Primary Care Andrea [name]: Andrea [name] Other Clinician: Referring Andrea [name]: Treating Andrea [name]/Extender: Andrea [name] in Treatment: 5 Encounter Discharge  Information Items Post Procedure Vitals Discharge Condition: Stable Temperature (F): 98.5 Ambulatory Status: Wheelchair Pulse (bpm): 106 Discharge Destination: Home Respiratory Rate (breaths/min): 16 Transportation: Private Auto Blood Pressure (mmHg): 141/81 Accompanied By: daughter Schedule Follow-up Appointment: Yes Clinical Summary of Care: Patient Declined Electronic Signature(s) Signed: 01/01/2022 4:13:22 PM By: Andrea [name] Entered By: Andrea [name] on 01/01/2022 16:09:20 Andrea [name] ([id number]) [id number] Page 2 of 12 -------------------------------------------------------------------------------- Lower Extremity Assessment Details Patient Name: Date of Service: Andrea [name] 01/01/2022 3:00 PM Medical Record Number: [id number] Patient Account Number: [id number] Date of Birth/Sex: Treating RN: July 19, 1940 (81 y.o. Andrea [name] Primary Care Andrea [name]: Andrea [name] Other Clinician: Referring Andrea [name]: Treating Andrea [name]/Extender: Andrea [name] in Treatment: 5 Edema Assessment Assessed: [Left: No] [Right: No] Edema: [Left: N] [Right: o] Calf Left: Right: Point of Measurement: From Medial Instep 31.5 cm Ankle Left: Right: Point of Measurement: From Medial Instep 20.5 cm Electronic Signature(s) Signed: 01/01/2022 4:13:22 PM By: Andrea [name] Entered By: Andrea [name] on 01/01/2022 15:03:30 -------------------------------------------------------------------------------- Multi Wound Chart Details Patient Name: Date of Service: Andrea Childes [name] 01/01/2022 3:00 PM Medical Record Number: [id number] Patient Account Number: [id number] Date of Birth/Sex: Treating RN: 09-24-1940 (80 y.o. F) Primary Care Andrea [name]: Andrea [name] Other Clinician: Referring Andrea [name]: Treating Andrea [name]/Extender: Andrea [name] in Treatment: 5 Vital Signs Height(in):  45 Pulse(bpm): 106 Weight(lbs): 155 Blood Pressure(mmHg): 141/81 Body Mass Index(BMI): 31.3 Temperature(F): 98.5 Respiratory Rate(breaths/min): 16 [1:Photos:] Right, Medial Malleolus Right Gluteus Right, Plantar Foot Wound Location: Pressure Injury Bump Skin Tear/Laceration Wounding Event: Pressure Ulcer Abscess Abrasion Primary Etiology: Cataracts, Arrhythmia, Hypertension, Cataracts, Arrhythmia, Hypertension, Cataracts, Arrhythmia, Hypertension, Comorbid History: Osteoarthritis, Osteomyelitis, Osteoarthritis, Osteomyelitis, Osteoarthritis, Osteomyelitis, Neuropathy, Confinement Anxiety Neuropathy, Confinement Anxiety Neuropathy, Confinement Anxiety Andrea [name], Andrea [name] ([id number]) [phone].pdf Page 3 of 12 11/16/2021 09/28/2021 12/03/2021 Date Acquired: _0 Weeks of Treatment: Open Open Open Wound Status: No No No Wound Recurrence: 1.4x0.9x0.1 1.5x2.4x3 0x0x0 Measurements L x W x D (cm) 0.99 2.827 0 [name] (cm) :  rea 0.099 8.482 0 Volume (cm) : 35.70% 72.90% 100.00% % Reduction in [name] rea: 35.70% 72.90% 100.00% % Reduction in Volume: 1 Starting Position 1 (o'clock): 4 Ending Position 1 (o'clock): 4.4 Maximum Distance 1 (cm): No Yes No Undermining: Category/Stage III Full Thickness With Exposed Support Full Thickness Without Exposed Classification: Structures Support Structures Medium Large None Present Exudate [name] mount: Serosanguineous Serosanguineous N/[name] Exudate Type: red, brown red, brown N/[name] Exudate Color: Distinct, outline attached Well defined, not attached Flat and Intact Wound Margin: Small (1-33%) Large (67-100%) None Present (0%) Granulation [name] mount: Red Red N/[name] Granulation Quality: Large (67-100%) Small (1-33%) None Present (0%) Necrotic [name] mount: Fat Layer (Subcutaneous Tissue): Yes Fat Layer (Subcutaneous Tissue): Yes Fascia: No Exposed Structures: Fascia: No Bone: Yes Fat Layer (Subcutaneous Tissue): No Tendon:  No Fascia: No Tendon: No Muscle: No Tendon: No Muscle: No Joint: No Muscle: No Joint: No Bone: No Joint: No Bone: No None None Large (67-100%) Epithelialization: Debridement - Selective/Open Wound N/[name] N/[name] Debridement: Pre-procedure Verification/Time Out 15:21 N/[name] N/[name] Taken: Lidocaine 4% Topical Solution N/[name] N/[name] Pain Control: Slough N/[name] N/[name] Tissue Debrided: Non-Viable Tissue N/[name] N/[name] Level: 1.26 N/[name] N/[name] Debridement [name] (sq cm): rea Curette N/[name] N/[name] Instrument: Minimum N/[name] N/[name] Bleeding: Pressure N/[name] N/[name] Hemostasis [name] chieved: Procedure was tolerated well N/[name] N/[name] Debridement Treatment Response: 1.4x0.9x0.1 N/[name] N/[name] Post Debridement Measurements L x W x D (cm) 0.099 N/[name] N/[name] Post Debridement Volume: (cm) Category/Stage III N/[name] N/[name] Post Debridement Stage: No Abnormalities Noted No Abnormalities Noted No Abnormalities Noted Periwound Skin Texture: No Abnormalities Noted No Abnormalities Noted No Abnormalities Noted Periwound Skin Moisture: Erythema: Yes Erythema: Yes No Abnormalities Noted Periwound Skin Color: Measured: 1cm N/[name] N/[name] Erythema Measurement: No Abnormality No Abnormality No Abnormality Temperature: N/[name] Yes N/[name] Tenderness on Palpation: Debridement N/[name] N/[name] Procedures Performed: Wound Number: 4 N/[name] N/[name] Photos: N/[name] N/[name] Left, Lateral Ankle N/[name] N/[name] Wound Location: Pressure Injury N/[name] N/[name] Wounding Event: Pressure Ulcer N/[name] N/[name] Primary Etiology: Cataracts, Arrhythmia, Hypertension, N/[name] N/[name] Comorbid History: Osteoarthritis, Osteomyelitis, Neuropathy, Confinement Anxiety 12/23/2021 N/[name] N/[name] Date Acquired: 0 N/[name] N/[name] Weeks of Treatment: Open N/[name] N/[name] Wound Status: No N/[name] N/[name] Wound Recurrence: 0.6x0.3x0.1 N/[name] N/[name] Measurements L x W x D (cm) 0.141 N/[name] N/[name] [name] (cm) : rea 0.014 N/[name] N/[name] Volume (cm) : N/[name] N/[name] N/[name] % Reduction in [name] rea: N/[name] N/[name] N/[name] % Reduction in Volume: No N/[name] N/[name] Undermining: Category/Stage II N/[name] N/[name] Classification: Medium N/[name]  N/[name] Exudate [name] mount: Serosanguineous N/[name] N/[name] Exudate Type: red, brown N/[name] N/[name] Exudate Color: Distinct, outline attached N/[name] N/[name] Wound Margin: Small (1-33%) N/[name] N/[name] Granulation [name] mountHELENA, Andrea [name] ([id number]) [id number] Page 4 of 12 Red N/[name] N/[name] Granulation Quality: Large (67-100%) N/[name] N/[name] Necrotic Amount: Fat Layer (Subcutaneous Tissue): Yes N/[name] N/[name] Exposed Structures: Fascia: No Tendon: No Muscle: No Joint: No Bone: No None N/[name] N/[name] Epithelialization: Debridement - Selective/Open Wound N/[name] N/[name] Debridement: Pre-procedure Verification/Time Out 15:21 N/[name] N/[name] Taken: Lidocaine 4% Topical Solution N/[name] N/[name] Pain Control: Slough N/[name] N/[name] Tissue Debrided: Non-Viable Tissue N/[name] N/[name] Level: 0.18 N/[name] N/[name] Debridement [name] (sq cm): rea Curette N/[name] N/[name] Instrument: Minimum N/[name] N/[name] Bleeding: Pressure N/[name] N/[name] Hemostasis [name] chieved: Procedure was tolerated well N/[name] N/[name] Debridement Treatment Response: 0.6x0.3x0.1 N/[name] N/[name] Post Debridement Measurements L x W x D (cm) 0.014 N/[name] N/[name] Post Debridement Volume: (cm) Category/Stage II N/[name] N/[name] Post Debridement Stage: No Abnormalities Noted N/[name] N/[name] Periwound Skin Texture: No Abnormalities Noted N/[name] N/[name] Periwound Skin Moisture: No  Abnormalities Noted N/[name] N/[name] Periwound Skin Color: N/[name] N/[name] N/[name] Erythema Measurement: No Abnormality N/[name] N/[name] Temperature: Debridement N/[name] N/[name] Procedures Performed: Treatment Notes Electronic Signature(s) Signed: 01/01/2022 3:49:27 PM By: Fredirick Maudlin MD FACS Entered By: Fredirick Maudlin on 01/01/2022 15:49:27 -------------------------------------------------------------------------------- Multi-Disciplinary Care Plan Details Patient Name: Date of Service: Andrea Childes [name] 01/01/2022 3:00 PM Medical Record Number: [id number] Patient Account Number: [id number] Date of Birth/Sex: Treating RN: 1940-08-20 (80 y.o. Andrea [name] Primary Care Angeline Trick: Andrea [name]  Other Clinician: Referring Arrion Broaddus: Treating Federico Maiorino/Extender: Andrea [name] in Treatment: [place] reviewed with physician Active Inactive Pressure Nursing Diagnoses: Knowledge deficit related to causes and risk factors for pressure ulcer development Knowledge deficit related to management of pressures ulcers Potential for impaired tissue integrity related to pressure, friction, moisture, and shear Goals: Patient/caregiver will verbalize understanding of pressure ulcer management Date Initiated: 11/24/2021 Target Resolution Date: 01/22/2022 Goal Status: Active Interventions: Assess: immobility, friction, shearing, incontinence upon admission and as needed Assess offloading mechanisms upon admission and as needed Assess potential for pressure ulcer upon admission and as needed Provide education on pressure ulcers Andrea [name], Andrea [name] ([id number]) [id number] Page 5 of 12 Treatment Activities: Pressure reduction/relief device ordered : 11/24/2021 Notes: Soft Tissue Infection Nursing Diagnoses: Impaired tissue integrity Potential for infection: soft tissue Goals: Patient's soft tissue infection will resolve Date Initiated: 11/24/2021 Target Resolution Date: 01/22/2022 Goal Status: Active Signs and symptoms of infection will be recognized early to allow for prompt treatment Date Initiated: 11/24/2021 Date Inactivated: 01/01/2022 Target Resolution Date: 12/22/2021 Goal Status: Met Interventions: Assess signs and symptoms of infection every visit Provide education on infection Treatment Activities: Culture : 11/24/2021 Culture and sensitivity : 11/24/2021 Education provided on Infection : 12/04/2021 Systemic antibiotics : 11/24/2021 Notes: Wound/Skin Impairment Nursing Diagnoses: Impaired tissue integrity Knowledge deficit related to ulceration/compromised skin integrity Goals: Patient/caregiver will  verbalize understanding of skin care regimen Date Initiated: 11/24/2021 Target Resolution Date: 01/22/2022 Goal Status: Active Ulcer/skin breakdown will have [name] volume reduction of 30% by week 4 Date Initiated: 11/24/2021 Target Resolution Date: 02/26/2022 Goal Status: Active Interventions: Assess patient/caregiver ability to obtain necessary supplies Assess patient/caregiver ability to perform ulcer/skin care regimen upon admission and as needed Assess ulceration(s) every visit Provide education on ulcer and skin care Treatment Activities: Skin care regimen initiated : 11/24/2021 Topical wound management initiated : 11/24/2021 Notes: Electronic Signature(s) Signed: 01/01/2022 4:13:22 PM By: Andrea [name] Entered By: Andrea [name] on 01/01/2022 15:20:07 -------------------------------------------------------------------------------- Pain Assessment Details Patient Name: Date of Service: Andrea Pouch [name]. 01/01/2022 3:00 PM Medical Record Number: [id number] Patient Account Number: [id number] Date of Birth/Sex: Treating RN: April 14, 1940 (81 y.o. Andrea [name] Primary Care Lesta Limbert: Andrea [name] Other Clinician: Referring Raiza Kiesel: Treating Devrin Monforte/Extender: Andrea [name], Andrea [name] ([id number]) [id number] Page 6 of 12 Weeks in Treatment: 5 Active Problems Location of Pain Severity and Description of Pain Patient Has Paino No Site Locations Rate the pain. Current Pain Level: 0 Pain Management and Medication Current Pain Management: Electronic Signature(s) Signed: 01/01/2022 4:13:22 PM By: Andrea [name] Entered By: Andrea [name] on 01/01/2022 15:02:50 -------------------------------------------------------------------------------- Patient/Caregiver Education Details Patient Name: Date of Service: Andrea [name] 12/8/2023andnbsp3:00 PM Medical Record Number: [id number] Patient Account Number:  [id number] Date of Birth/Gender: Treating RN: Oct 18, 1940 (81 y.o. Andrea [name] Primary Care Physician: Andrea [name] Other Clinician: Referring Physician: Treating Physician/Extender: Andrea [name] in Treatment: 5 Education Assessment Education Provided To: Patient Education Topics Provided Wound/Skin Impairment: Methods:  Explain/Verbal Responses: Reinforcements needed, State content correctly Electronic Signature(s) Signed: 01/01/2022 4:13:22 PM By: Andrea [name] Entered By: Andrea [name] on 01/01/2022 15:21:38 Andrea [name] ([id number]) [id number] Page 7 of 12 -------------------------------------------------------------------------------- Wound Assessment Details Patient Name: Date of Service: Andrea [name] 01/01/2022 3:00 PM Medical Record Number: [id number] Patient Account Number: [id number] Date of Birth/Sex: Treating RN: 1940-03-30 (81 y.o. Andrea [name] Primary Care Catherin Doorn: Andrea [name] Other Clinician: Referring Shyleigh Daughtry: Treating Jahnay Lantier/Extender: Andrea [name] in Treatment: 5 Wound Status Wound Number: 1 Primary Pressure Ulcer Etiology: Wound Location: Right, Medial Malleolus Wound Open Wounding Event: Pressure Injury Status: Date Acquired: 11/16/2021 Comorbid Cataracts, Arrhythmia, Hypertension, Osteoarthritis, Weeks Of Treatment: 5 History: Osteomyelitis, Neuropathy, Confinement Anxiety Clustered Wound: No Photos Wound Measurements Length: (cm) 1.4 Width: (cm) 0.9 Depth: (cm) 0.1 Area: (cm) 0.99 Volume: (cm) 0.099 % Reduction in Area: 35.7% % Reduction in Volume: 35.7% Epithelialization: None Tunneling: No Undermining: No Wound Description Classification: Category/Stage III Wound Margin: Distinct, outline attached Exudate Amount: Medium Exudate Type: Serosanguineous Exudate Color: red, brown Foul Odor After Cleansing:  No Slough/Fibrino Yes Wound Bed Granulation Amount: Small (1-33%) Exposed Structure Granulation Quality: Red Fascia Exposed: No Necrotic Amount: Large (67-100%) Fat Layer (Subcutaneous Tissue) Exposed: Yes Necrotic Quality: Adherent Slough Tendon Exposed: No Muscle Exposed: No Joint Exposed: No Bone Exposed: No Periwound Skin Texture Texture Color No Abnormalities Noted: Yes No Abnormalities Noted: No Erythema: Yes Moisture Erythema Measurement: Measured No Abnormalities Noted: Yes 1 cm Temperature / Pain Temperature: No Abnormality Treatment Notes Wound #1 (Malleolus) Wound Laterality: Right, Medial Andrea [name], Andrea [name] ([id number]) [id number] Page 8 of 12 Cleanser Normal Saline Discharge Instruction: Cleanse the wound with Normal Saline prior to applying [name] clean dressing using gauze sponges, not tissue or cotton balls. Byram Ancillary Kit - 15 Day Supply Discharge Instruction: Use supplies as instructed; Kit contains: (15) Saline Bullets; (15) 3x3 Gauze; 15 pr Gloves Peri-Wound Care Skin Prep Discharge Instruction: Use skin prep as directed Topical Skintegrity Hydrogel 4 (oz) Discharge Instruction: Apply hydrogel as directed Primary Dressing Promogran Prisma Matrix, 4.34 (sq in) (silver collagen) Discharge Instruction: Moisten collagen with saline or hydrogel Secondary Dressing Zetuvit Plus Silicone Border Dressing 5x5 (in/in) Discharge Instruction: Apply silicone border over primary dressing as directed. Secured With [employer], 4.5x3.1 (in/yd) Discharge Instruction: Secure with Kerlix as directed. Compression Wrap Compression Stockings Add-Ons Electronic Signature(s) Signed: 01/01/2022 4:13:22 PM By: Andrea [name] Entered By: Andrea [name] on 01/01/2022 15:08:21 -------------------------------------------------------------------------------- Wound Assessment Details Patient Name: Date of Service: Andrea Pouch [name].  01/01/2022 3:00 PM Medical Record Number: [id number] Patient Account Number: [id number] Date of Birth/Sex: Treating RN: [birth year] (81 y.o. Andrea [name] Primary Care Malaiya Paczkowski: Andrea [name] Other Clinician: Referring Greysen Devino: Treating Dane Bloch/Extender: Andrea [name] in Treatment: 5 Wound Status Wound Number: 2 Primary Abscess Etiology: Wound Location: Right Gluteus Wound Open Wounding Event: Bump Status: Date Acquired: 09/28/2021 Comorbid Cataracts, Arrhythmia, Hypertension, Osteoarthritis, Weeks Of Treatment: 5 History: Osteomyelitis, Neuropathy, Confinement Anxiety Clustered Wound: No Photos Andrea [name], Andrea [name] ([id number]) [id number] Page 9 of 12 Wound Measurements Length: (cm) 1.5 Width: (cm) 2.4 Depth: (cm) 3 Area: (cm) 2.827 Volume: (cm) 8.482 % Reduction in Area: 72.9% % Reduction in Volume: 72.9% Epithelialization: None Tunneling: No Undermining: Yes Starting Position (o'clock): 1 Ending Position (o'clock): 4 Maximum Distance: (cm) 4.4 Wound Description Classification: Full Thickness With Exposed Support Structures Wound Margin: Well defined, not attached Exudate Amount: Large Exudate Type: Serosanguineous Exudate Color: red, brown Foul Odor After Cleansing:  No Slough/Fibrino Yes Wound Bed Granulation Amount: Large (67-100%) Exposed Structure Granulation Quality: Red Fascia Exposed: No Necrotic Amount: Small (1-33%) Fat Layer (Subcutaneous Tissue) Exposed: Yes Necrotic Quality: Adherent Slough Tendon Exposed: No Muscle Exposed: No Joint Exposed: No Bone Exposed: Yes Periwound Skin Texture Texture Color No Abnormalities Noted: Yes No Abnormalities Noted: Yes Moisture Temperature / Pain No Abnormalities Noted: Yes Temperature: No Abnormality Tenderness on Palpation: Yes Treatment Notes Wound #2 (Gluteus) Wound Laterality: Right Cleanser Normal Saline Discharge Instruction: Cleanse the  wound with Normal Saline prior to applying [name] clean dressing using gauze sponges, not tissue or cotton balls. Peri-Wound Care Skin Prep Discharge Instruction: Use skin prep as directed Topical Primary Dressing Dakin's Solution 0.25%, 16 (oz) Discharge Instruction: Moisten gauze with Dakin's solution and pack into wound Kerlix AMD Roll Dressing, 4.5x 4.1 (in/in) Discharge Instruction: moisten with Dakin's and pack into wound Secondary Dressing Zetuvit Plus Silicone Border Dressing 5x5 (in/in) Discharge Instruction: Apply silicone border over primary dressing as directed. Secured With Compression Wrap Compression Stockings Add-Ons Electronic Signature(s) Signed: 01/01/2022 4:13:22 PM By: Andrea [name] Entered By: Andrea [name] on 01/01/2022 15:13:54 Andrea [name] ([id number]) [id number] Page 10 of 12 -------------------------------------------------------------------------------- Wound Assessment Details Patient Name: Date of Service: Andrea [name] 01/01/2022 3:00 PM Medical Record Number: [id number] Patient Account Number: [id number] Date of Birth/Sex: Treating RN: 1940-11-27 (81 y.o. Andrea [name] Primary Care Tylene Quashie: Andrea [name] Other Clinician: Referring Jaymes Revels: Treating Plato Alspaugh/Extender: Andrea [name] in Treatment: 5 Wound Status Wound Number: 3 Primary Abrasion Etiology: Wound Location: Right, Plantar Foot Wound Open Wounding Event: Skin Tear/Laceration Status: Date Acquired: 12/03/2021 Comorbid Cataracts, Arrhythmia, Hypertension, Osteoarthritis, Weeks Of Treatment: 4 History: Osteomyelitis, Neuropathy, Confinement Anxiety Clustered Wound: No Photos Wound Measurements Length: (cm) Width: (cm) Depth: (cm) Area: (cm) Volume: (cm) 0 % Reduction in Area: 100% 0 % Reduction in Volume: 100% 0 Epithelialization: Large (67-100%) 0 Tunneling: No 0 Undermining: No Wound  Description Classification: Full Thickness Without Exposed Support Structures Wound Margin: Flat and Intact Exudate Amount: None Present Foul Odor After Cleansing: No Slough/Fibrino No Wound Bed Granulation Amount: None Present (0%) Exposed Structure Necrotic Amount: None Present (0%) Fascia Exposed: No Fat Layer (Subcutaneous Tissue) Exposed: No Tendon Exposed: No Muscle Exposed: No Joint Exposed: No Bone Exposed: No Periwound Skin Texture Texture Color No Abnormalities Noted: Yes No Abnormalities Noted: Yes Moisture Temperature / Pain No Abnormalities Noted: Yes Temperature: No Abnormality Electronic Signature(s) Signed: 01/01/2022 4:13:22 PM By: Andrea [name] Entered By: Andrea [name] on 01/01/2022 15:08:46 Andrea [name] ([id number]) [id number] Page 11 of 12 -------------------------------------------------------------------------------- Wound Assessment Details Patient Name: Date of Service: Andrea [name] 01/01/2022 3:00 PM Medical Record Number: [id number] Patient Account Number: [id number] Date of Birth/Sex: Treating RN: 19-Aug-1940 (81 y.o. Andrea [name] Primary Care Desiderio Dolata: Andrea [name] Other Clinician: Referring Michi Herrmann: Treating Kyden Potash/Extender: Andrea [name] in Treatment: 5 Wound Status Wound Number: 4 Primary Pressure Ulcer Etiology: Wound Location: Left, Lateral Ankle Wound Open Wounding Event: Pressure Injury Status: Date Acquired: 12/23/2021 Comorbid Cataracts, Arrhythmia, Hypertension, Osteoarthritis, Weeks Of Treatment: 0 History: Osteomyelitis, Neuropathy, Confinement Anxiety Clustered Wound: No Photos Wound Measurements Length: (cm) 0.6 Width: (cm) 0.3 Depth: (cm) 0.1 Area: (cm) 0.141 Volume: (cm) 0.014 % Reduction in Area: % Reduction in Volume: Epithelialization: None Tunneling: No Undermining: No Wound Description Classification: Category/Stage  II Wound Margin: Distinct, outline attached Exudate Amount: Medium Exudate Type: Serosanguineous Exudate Color: red, brown Foul Odor After Cleansing: No Slough/Fibrino Yes Wound Bed  Granulation Amount: Small (1-33%) Exposed Structure Granulation Quality: Red Fascia Exposed: No Necrotic Amount: Large (67-100%) Fat Layer (Subcutaneous Tissue) Exposed: Yes Necrotic Quality: Adherent Slough Tendon Exposed: No Muscle Exposed: No Joint Exposed: No Bone Exposed: No Periwound Skin Texture Texture Color No Abnormalities Noted: Yes No Abnormalities Noted: Yes Moisture Temperature / Pain No Abnormalities Noted: Yes Temperature: No Abnormality Treatment Notes Wound #4 (Ankle) Wound Laterality: Left, Lateral Cleanser Normal Saline Discharge Instruction: Cleanse the wound with Normal Saline prior to applying [name] clean dressing using gauze sponges, not tissue or cotton balls. [employer] Ancillary Kit - 15 Day Supply Discharge Instruction: Use supplies as instructed; Kit contains: (15) Saline Bullets; (15) 3x3 Gauze; 15 pr Gloves Andrea [name], Andrea [name] ([id number]) [id number] Page 12 of 12 Peri-Wound Care Skin Prep Discharge Instruction: Use skin prep as directed Topical Skintegrity Hydrogel 4 (oz) Discharge Instruction: Apply hydrogel as directed Primary Dressing Promogran Prisma Matrix, 4.34 (sq in) (silver collagen) Discharge Instruction: Moisten collagen with saline or hydrogel Secondary Dressing Zetuvit Plus Silicone Border Dressing 5x5 (in/in) Discharge Instruction: Apply silicone border over primary dressing as directed. Secured With [employer], 4.5x3.1 (in/yd) Discharge Instruction: Secure with Kerlix as directed. Compression Wrap Compression Stockings Add-Ons Electronic Signature(s) Signed: 01/01/2022 4:13:22 PM By: Andrea [name] Entered By: Andrea [name] on 01/01/2022  15:09:45 -------------------------------------------------------------------------------- Vitals Details Patient Name: Date of Service: Andrea Childes [name] 01/01/2022 3:00 PM Medical Record Number: [id number] Patient Account Number: [id number] Date of Birth/Sex: Treating RN: Nov 21, 1940 (80 y.o. Andrea [name] Primary Care [place]: Andrea [name] Other Clinician: Referring Itza Maniaci: Treating Erven Ramson/Extender: Andrea [name] in Treatment: 5 Vital Signs Time Taken: 15:02 Temperature (F): 98.5 Height (in): 59 Pulse (bpm): 106 Weight (lbs): 155 Respiratory Rate (breaths/min): 16 Body Mass Index (BMI): 31.3 Blood Pressure (mmHg): 141/81 Reference Range: 80 - 120 mg / dl Electronic Signature(s) Signed: 01/01/2022 4:13:22 PM By: Andrea [name] Entered By: Andrea [name] on 01/01/2022 15:02:41

## 2022-01-20 ENCOUNTER — Ambulatory Visit (HOSPITAL_BASED_OUTPATIENT_CLINIC_OR_DEPARTMENT_OTHER): Payer: Medicare Other | Admitting: General Surgery

## 2022-02-09 ENCOUNTER — Encounter (HOSPITAL_BASED_OUTPATIENT_CLINIC_OR_DEPARTMENT_OTHER): Payer: Medicare Other | Attending: General Surgery | Admitting: General Surgery

## 2022-02-09 DIAGNOSIS — Z96653 Presence of artificial knee joint, bilateral: Secondary | ICD-10-CM | POA: Insufficient documentation

## 2022-02-09 DIAGNOSIS — M1990 Unspecified osteoarthritis, unspecified site: Secondary | ICD-10-CM | POA: Insufficient documentation

## 2022-02-09 DIAGNOSIS — I4891 Unspecified atrial fibrillation: Secondary | ICD-10-CM | POA: Diagnosis not present

## 2022-02-09 DIAGNOSIS — Z9049 Acquired absence of other specified parts of digestive tract: Secondary | ICD-10-CM | POA: Diagnosis not present

## 2022-02-09 DIAGNOSIS — I10 Essential (primary) hypertension: Secondary | ICD-10-CM | POA: Insufficient documentation

## 2022-02-09 DIAGNOSIS — Z87891 Personal history of nicotine dependence: Secondary | ICD-10-CM | POA: Diagnosis not present

## 2022-02-09 DIAGNOSIS — L89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, stage 3: Secondary | ICD-10-CM | POA: Insufficient documentation

## 2022-02-09 DIAGNOSIS — K219 Gastro-esophageal reflux disease without esophagitis: Secondary | ICD-10-CM | POA: Insufficient documentation

## 2022-02-09 DIAGNOSIS — Z7952 Long term (current) use of systemic steroids: Secondary | ICD-10-CM | POA: Insufficient documentation

## 2022-02-09 DIAGNOSIS — L89522 Pressure ulcer of left ankle, stage 2: Secondary | ICD-10-CM | POA: Diagnosis not present

## 2022-02-09 DIAGNOSIS — L98415 Non-pressure chronic ulcer of buttock with muscle involvement without evidence of necrosis: Secondary | ICD-10-CM | POA: Diagnosis not present

## 2022-02-09 DIAGNOSIS — E2740 Unspecified adrenocortical insufficiency: Secondary | ICD-10-CM | POA: Insufficient documentation

## 2022-02-09 DIAGNOSIS — Z6831 Body mass index (BMI) 31.0-31.9, adult: Secondary | ICD-10-CM | POA: Diagnosis not present

## 2022-02-09 NOTE — Progress Notes (Signed)
LAMIYAH, SCHLOTTER A ([id number]) [id number] Page 1 of 12 Visit Report for 02/09/2022 Arrival Information Details Patient Name: Date of Service: Mannie Stabile 02/09/2022 12:45 PM Medical Record Number: [id number] Patient Account Number: [id number] Date of Birth/Sex: Treating RN: 15-Mar-1940 (82 y.o. [name] Primary Care Lesieli Bresee: Jarome Matin Other Clinician: Referring Lavelle Berland: Treating Atari Novick/Extender: Marena Chancy in Treatment: 11 Visit Information History Since Last Visit Added or deleted any medications: No Patient Arrived: Ambulatory Any new allergies or adverse reactions: No Arrival Time: 12:58 Had a fall or experienced change in No Accompanied By: daughter, careworker activities of daily living that may affect Transfer Assistance: Manual risk of falls: Patient Identification Verified: Yes Signs or symptoms of abuse/neglect since last visito No Secondary Verification Process Completed: Yes Hospitalized since last visit: No Patient Requires Transmission-Based Precautions: No Implantable device outside of the clinic excluding No Patient Has Alerts: No cellular tissue based products placed in the center since last visit: Has Dressing in Place as Prescribed: Yes Pain Present Now: No Electronic Signature(s) Signed: 02/09/2022 3:04:08 PM By: Tommie Ard RN Entered By: Tommie Ard on 02/09/2022 13:05:10 -------------------------------------------------------------------------------- Encounter Discharge Information Details Patient Name: Date of Service: Mannie Stabile. 02/09/2022 12:45 PM Medical Record Number: [id number] Patient Account Number: [id number] Date of Birth/Sex: Treating RN: [birth year] (81 y.o. [name] Primary Care Jaelan Rasheed: Jarome Matin Other Clinician: Referring Marlen Koman: Treating Kylia Grajales/Extender: Marena Chancy in Treatment: 11 Encounter Discharge  Information Items Post Procedure Vitals Discharge Condition: Stable Temperature (F): 97.9 Ambulatory Status: Walker Pulse (bpm): 73 Discharge Destination: Home Respiratory Rate (breaths/min): 18 Transportation: Private Auto Blood Pressure (mmHg): 160/84 Accompanied By: daughter Schedule Follow-up Appointment: Yes Clinical Summary of Care: Electronic Signature(s) Signed: 02/09/2022 3:04:08 PM By: Tommie Ard RN Entered By: Tommie Ard on 02/09/2022 13:29:07 [name] ([id number]) [id number] Page 2 of 12 -------------------------------------------------------------------------------- Lower Extremity Assessment Details Patient Name: Date of Service: Mannie Stabile 02/09/2022 12:45 PM Medical Record Number: [id number] Patient Account Number: [id number] Date of Birth/Sex: Treating RN: 03-Jan-1941 (82 y.o. [name] Primary Care Journey [place]: Jarome Matin Other Clinician: Referring Janeisha Ryle: Treating Yudith Norlander/Extender: Marena Chancy in Treatment: 11 Edema Assessment Assessed: Kyra Searles: No] [Right: No] Edema: [Left: N] [Right: o] Calf Left: Right: Point of Measurement: From Medial Instep 21 cm 31.4 cm Ankle Left: Right: Point of Measurement: From Medial Instep 18 cm 21 cm Vascular Assessment Pulses: Dorsalis Pedis Palpable: [Left:Yes] [Right:Yes] Electronic Signature(s) Signed: 02/09/2022 3:04:08 PM By: Tommie Ard RN Entered By: Tommie Ard on 02/09/2022 13:02:02 -------------------------------------------------------------------------------- Multi Wound Chart Details Patient Name: Date of Service: Mannie Stabile. 02/09/2022 12:45 PM Medical Record Number: [id number] Patient Account Number: [id number] Date of Birth/Sex: Treating RN: July 27, 1940 (82 y.o. [name] Primary Care Linley Moxley: Jarome Matin Other Clinician: Referring Dulce Martian: Treating Raffaella Edison/Extender: Marena Chancy in Treatment: 11 Vital Signs Height(in): 59 Pulse(bpm): 73 Weight(lbs): 155 Blood Pressure(mmHg): 160/84 Body Mass Index(BMI): 31.3 Temperature(F): 97.9 Respiratory Rate(breaths/min): 18 [1:Photos:] [[id number] Page 3 of 12] Right, Medial Malleolus Right Gluteus Left, Lateral Ankle Wound Location: Pressure Injury Bump Pressure Injury Wounding Event: Pressure Ulcer Abscess Pressure Ulcer Primary Etiology: Cataracts, Arrhythmia, Hypertension, Cataracts, Arrhythmia, Hypertension, Cataracts, Arrhythmia, Hypertension, Comorbid History: Osteoarthritis, Osteomyelitis, Osteoarthritis, Osteomyelitis, Osteoarthritis, Osteomyelitis, Neuropathy, Confinement Anxiety Neuropathy, Confinement Anxiety Neuropathy, Confinement Anxiety 11/16/2021 09/28/2021 12/23/2021 Date Acquired: 11 11 5  Weeks of Treatment: Open Open Open Wound Status: No No No Wound Recurrence: 0.8x1x0.1 1x2x2.5 0.4x0.3x0.1 Measurements  L x W x D (cm) 0.628 1.571 0.094 A (cm) : rea 0.063 3.927 0.009 Volume (cm) : 59.20% 85.00% 33.30% % Reduction in A rea: 59.10% 87.50% 35.70% % Reduction in Volume: 12 Position 1 (o'clock): 4.2 Maximum Distance 1 (cm): 2 Position 2 (o'clock): 5 Maximum Distance 2 (cm): 12 Starting Position 1 (o'clock): 12 Ending Position 1 (o'clock): 3 Maximum Distance 1 (cm): No Yes No Tunneling: No Yes No Undermining: Category/Stage III Full Thickness With Exposed Support Category/Stage II Classification: Structures Medium Large Medium Exudate A mount: Serosanguineous Serosanguineous Serosanguineous Exudate Type: red, brown red, brown red, brown Exudate Color: Distinct, outline attached Well defined, not attached Distinct, outline attached Wound Margin: Small (1-33%) Large (67-100%) Small (1-33%) Granulation A mount: Red Pink Red Granulation Quality: Large (67-100%) Small (1-33%) Large (67-100%) Necrotic A mount: Fat Layer  (Subcutaneous Tissue): Yes Fat Layer (Subcutaneous Tissue): Yes Fat Layer (Subcutaneous Tissue): Yes Exposed Structures: Fascia: No Bone: Yes Fascia: No Tendon: No Fascia: No Tendon: No Muscle: No Tendon: No Muscle: No Joint: No Muscle: No Joint: No Bone: No Joint: No Bone: No None None None Epithelialization: Debridement - Selective/Open Wound N/A Debridement - Selective/Open Wound Debridement: Pre-procedure Verification/Time Out 13:23 N/A 13:23 Taken: Lidocaine 5% topical ointment N/A Lidocaine 5% topical ointment Pain Control: Slough N/A Slough Tissue Debrided: Non-Viable Tissue N/A Non-Viable Tissue Level: 0.8 N/A 0.12 Debridement A (sq cm): rea Curette N/A Curette Instrument: Minimum N/A Minimum Bleeding: Pressure N/A Pressure Hemostasis A chieved: Procedure was tolerated well N/A Procedure was tolerated well Debridement Treatment Response: 0.8x1x0.1 N/A 0.4x0.3x0.1 Post Debridement Measurements L x W x D (cm) 0.063 N/A 0.009 Post Debridement Volume: (cm) Category/Stage III N/A Category/Stage II Post Debridement Stage: No Abnormalities Noted No Abnormalities Noted No Abnormalities Noted Periwound Skin Texture: No Abnormalities Noted No Abnormalities Noted No Abnormalities Noted Periwound Skin Moisture: Erythema: Yes Erythema: Yes No Abnormalities Noted Periwound Skin Color: Measured: 1cm N/A N/A Erythema Measurement: No Abnormality No Abnormality No Abnormality Temperature: N/A Yes N/A Tenderness on Palpation: Debridement N/A Debridement Procedures Performed: Treatment Notes Wound #1 (Malleolus) Wound Laterality: Right, Medial Cleanser Normal Saline Discharge Instruction: Cleanse the wound with Normal Saline prior to applying a clean dressing using gauze sponges, not tissue or cotton balls. Byram Ancillary Kit - 15 Day Supply Discharge Instruction: Use supplies as instructed; Kit contains: (15) Saline Bullets; (15) 3x3 Gauze; 15 pr  Gloves Peri-Wound Care Skin Prep Discharge Instruction: Use skin prep as directed Topical Skintegrity Hydrogel 4 (oz) Discharge Instruction: Apply hydrogel as directed ANICIA, LEUTHOLD A ([id number]) [phone].pdf Page 4 of 12 Primary Dressing Promogran Prisma Matrix, 4.34 (sq in) (silver collagen) Discharge Instruction: Moisten collagen with saline or hydrogel Secondary Dressing Zetuvit Plus Silicone Border Dressing 5x5 (in/in) Discharge Instruction: Apply silicone border over primary dressing as directed. Secured With [employer], 4.5x3.1 (in/yd) Discharge Instruction: Secure with Kerlix as directed. Compression Wrap Compression Stockings Add-Ons Wound #2 (Gluteus) Wound Laterality: Right Cleanser Normal Saline Discharge Instruction: Cleanse the wound with Normal Saline prior to applying a clean dressing using gauze sponges, not tissue or cotton balls. Peri-Wound Care Skin Prep Discharge Instruction: Use skin prep as directed Topical Primary Dressing Dakin's Solution 0.25%, 16 (oz) Discharge Instruction: Moisten gauze with Dakin's solution and pack into wound Kerlix AMD Roll Dressing, 4.5x 4.1 (in/in) Discharge Instruction: moisten with Dakin's and pack into wound Secondary Dressing Zetuvit Plus Silicone Border Dressing 5x5 (in/in) Discharge Instruction: Apply silicone border over primary dressing as directed. Secured With Compression Wrap Compression Stockings Add-Ons Wound #  4 (Ankle) Wound Laterality: Left, Lateral Cleanser Normal Saline Discharge Instruction: Cleanse the wound with Normal Saline prior to applying a clean dressing using gauze sponges, not tissue or cotton balls. Byram Ancillary Kit - 15 Day Supply Discharge Instruction: Use supplies as instructed; Kit contains: (15) Saline Bullets; (15) 3x3 Gauze; 15 pr Gloves Peri-Wound Care Skin Prep Discharge Instruction: Use skin prep as directed Topical Skintegrity Hydrogel 4  (oz) Discharge Instruction: Apply hydrogel as directed Primary Dressing Promogran Prisma Matrix, 4.34 (sq in) (silver collagen) Discharge Instruction: Moisten collagen with saline or hydrogel Secondary Dressing Zetuvit Plus Silicone Border Dressing 5x5 (in/in) Discharge Instruction: Apply silicone border over primary dressing as directed. Secured With [employer], 4.5x3.1 (in/yd) Discharge Instruction: Secure with Kerlix as directed. VONETTA, FOULK A ([id number]) [id number] Page 5 of 12 Compression Wrap Compression Stockings Add-Ons Electronic Signature(s) Signed: 02/09/2022 1:34:20 PM By: Fredirick Maudlin MD FACS Signed: 02/09/2022 3:04:08 PM By: Blanche East RN Entered By: Fredirick Maudlin on 02/09/2022 13:34:19 -------------------------------------------------------------------------------- Multi-Disciplinary Care Plan Details Patient Name: Date of Service: Elenor Quinones 02/09/2022 12:45 PM Medical Record Number: [id number] Patient Account Number: [id number] Date of Birth/Sex: Treating RN: Jan 31, 1940 (81 y.o. Iver Nestle, Jamie Primary Care Justyn Langham: Leanna Battles Other Clinician: Referring Daquisha Clermont: Treating Nethaniel Mattie/Extender: Kathlyn [place] in Treatment: [place] reviewed with physician Active Inactive Pressure Nursing Diagnoses: Knowledge deficit related to causes and risk factors for pressure ulcer development Knowledge deficit related to management of pressures ulcers Potential for impaired tissue integrity related to pressure, friction, moisture, and shear Goals: Patient/caregiver will verbalize understanding of pressure ulcer management Date Initiated: 11/24/2021 Target Resolution Date: 03/25/2022 Goal Status: Active Interventions: Assess: immobility, friction, shearing, incontinence upon admission and as needed Assess offloading mechanisms upon admission and as needed Assess  potential for pressure ulcer upon admission and as needed Provide education on pressure ulcers Treatment Activities: Pressure reduction/relief device ordered : 11/24/2021 Notes: Soft Tissue Infection Nursing Diagnoses: Impaired tissue integrity Potential for infection: soft tissue Goals: Patient's soft tissue infection will resolve Date Initiated: 11/24/2021 Target Resolution Date: 03/25/2022 Goal Status: Active Signs and symptoms of infection will be recognized early to allow for prompt treatment Date Initiated: 11/24/2021 Date Inactivated: 01/01/2022 Target Resolution Date: 12/22/2021 Goal Status: Met Interventions: Assess signs and symptoms of infection every visit Provide education on infection LAILYN, APPELBAUM ([id number]) [phone].pdf Page 6 of 12 Treatment Activities: Culture : 11/24/2021 Culture and sensitivity : 11/24/2021 Education provided on Infection : 12/04/2021 Systemic antibiotics : 11/24/2021 Notes: Wound/Skin Impairment Nursing Diagnoses: Impaired tissue integrity Knowledge deficit related to ulceration/compromised skin integrity Goals: Patient/caregiver will verbalize understanding of skin care regimen Date Initiated: 11/24/2021 Target Resolution Date: 03/25/2022 Goal Status: Active Ulcer/skin breakdown will have a volume reduction of 30% by week 4 Date Initiated: 11/24/2021 Target Resolution Date: 02/26/2022 Goal Status: Active Interventions: Assess patient/caregiver ability to obtain necessary supplies Assess patient/caregiver ability to perform ulcer/skin care regimen upon admission and as needed Assess ulceration(s) every visit Provide education on ulcer and skin care Treatment Activities: Skin care regimen initiated : 11/24/2021 Topical wound management initiated : 11/24/2021 Notes: Electronic Signature(s) Signed: 02/09/2022 3:04:08 PM By: Blanche East RN Entered By: Blanche East on 02/09/2022  13:27:02 -------------------------------------------------------------------------------- Pain Assessment Details Patient Name: Date of Service: Elenor Quinones 02/09/2022 12:45 PM Medical Record Number: [id number] Patient Account Number: [id number] Date of Birth/Sex: Treating RN: 20-Jun-1940 (82 y.o. [name] Primary Care Kale Dols: Leanna Battles Other Clinician: Referring Loye Vento: Treating Shanessa Hodak/Extender: Fredirick Maudlin  Jarome Matin Weeks in Treatment: 11 Active Problems Location of Pain Severity and Description of Pain Patient Has Paino No Site Locations Rate the pain. JAZLYNE, GAUGER A ([id number]) [id number] Page 7 of 12 Rate the pain. Current Pain Level: 0 Pain Management and Medication Current Pain Management: Electronic Signature(s) Signed: 02/09/2022 3:04:08 PM By: Tommie Ard RN Entered By: Tommie Ard on 02/09/2022 13:00:03 -------------------------------------------------------------------------------- Patient/Caregiver Education Details Patient Name: Date of Service: Mannie Stabile 1/16/2024andnbsp12:45 PM Medical Record Number: [id number] Patient Account Number: [id number] Date of Birth/Gender: Treating RN: [birth year] (82 y.o. [name] Primary Care Physician: Jarome Matin Other Clinician: Referring Physician: Treating Physician/Extender: Marena Chancy in Treatment: 11 Education Assessment Education Provided To: Patient Education Topics Provided Wound Debridement: Methods: Explain/Verbal Responses: Reinforcements needed, State content correctly Wound/Skin Impairment: Methods: Explain/Verbal Responses: Reinforcements needed, State content correctly Electronic Signature(s) Signed: 02/09/2022 3:04:08 PM By: Tommie Ard RN Entered By: Tommie Ard on 02/09/2022 13:28:10 -------------------------------------------------------------------------------- Wound Assessment  Details Patient Name: Date of Service: [name] 02/09/2022 12:45 PM [name] ([id number]) [id number].pdf Page 8 of 12 Medical Record Number: [id number] Patient Account Number: [id number] Date of Birth/Sex: Treating RN: [birth year] (82 y.o. Roselee Nova, Jamie Primary Care Lamari Youngers: Jarome Matin Other Clinician: Referring Chanita Boden: Treating Jayleah Garbers/Extender: Marena Chancy in Treatment: 11 Wound Status Wound Number: 1 Primary Pressure Ulcer Etiology: Wound Location: Right, Medial Malleolus Wound Open Wounding Event: Pressure Injury Status: Date Acquired: 11/16/2021 Comorbid Cataracts, Arrhythmia, Hypertension, Osteoarthritis, Weeks Of Treatment: 11 History: Osteomyelitis, Neuropathy, Confinement Anxiety Clustered Wound: No Photos Wound Measurements Length: (cm) 0.8 Width: (cm) 1 Depth: (cm) 0.1 Area: (cm) 0.628 Volume: (cm) 0.063 % Reduction in Area: 59.2% % Reduction in Volume: 59.1% Epithelialization: None Tunneling: No Undermining: No Wound Description Classification: Category/Stage III Wound Margin: Distinct, outline attached Exudate Amount: Medium Exudate Type: Serosanguineous Exudate Color: red, brown Foul Odor After Cleansing: No Slough/Fibrino Yes Wound Bed Granulation Amount: Small (1-33%) Exposed Structure Granulation Quality: Red Fascia Exposed: No Necrotic Amount: Large (67-100%) Fat Layer (Subcutaneous Tissue) Exposed: Yes Necrotic Quality: Adherent Slough Tendon Exposed: No Muscle Exposed: No Joint Exposed: No Bone Exposed: No Periwound Skin Texture Texture Color No Abnormalities Noted: Yes No Abnormalities Noted: No Erythema: Yes Moisture Erythema Measurement: Measured No Abnormalities Noted: Yes 1 cm Temperature / Pain Temperature: No Abnormality Treatment Notes Wound #1 (Malleolus) Wound Laterality: Right, Medial Cleanser Normal Saline Discharge Instruction: Cleanse the  wound with Normal Saline prior to applying a clean dressing using gauze sponges, not tissue or cotton balls. Byram Ancillary Kit - 15 Day Supply Discharge Instruction: Use supplies as instructed; Kit contains: (15) Saline Bullets; (15) 3x3 Gauze; 15 pr Gloves Peri-Wound Care Skin Prep Discharge Instruction: Use skin prep as directed SIMMIE, GARIN A ([id number]) [phone].pdf Page 9 of 12 Topical Skintegrity Hydrogel 4 (oz) Discharge Instruction: Apply hydrogel as directed Primary Dressing Promogran Prisma Matrix, 4.34 (sq in) (silver collagen) Discharge Instruction: Moisten collagen with saline or hydrogel Secondary Dressing Zetuvit Plus Silicone Border Dressing 5x5 (in/in) Discharge Instruction: Apply silicone border over primary dressing as directed. Secured With [employer], 4.5x3.1 (in/yd) Discharge Instruction: Secure with Kerlix as directed. Compression Wrap Compression Stockings Add-Ons Electronic Signature(s) Signed: 02/09/2022 3:04:08 PM By: Tommie Ard RN Entered By: Tommie Ard on 02/09/2022 13:05:43 -------------------------------------------------------------------------------- Wound Assessment Details Patient Name: Date of Service: Mannie Stabile 02/09/2022 12:45 PM Medical Record Number: [id number] Patient Account Number: [id number] Date of Birth/Sex: Treating RN: 08-31-1940 (82 y.o. F)  Blanche East Primary Care Corabelle Spackman: Leanna Battles Other Clinician: Referring Jamario Colina: Treating Adedamola Seto/Extender: Kathlyn [place] in Treatment: 11 Wound Status Wound Number: 2 Primary Abscess Etiology: Wound Location: Right Gluteus Wound Open Wounding Event: Bump Status: Date Acquired: 09/28/2021 Comorbid Cataracts, Arrhythmia, Hypertension, Osteoarthritis, Weeks Of Treatment: 11 History: Osteomyelitis, Neuropathy, Confinement Anxiety Clustered Wound: No Photos Wound Measurements Length: (cm) 1 Width: (cm)  2 Depth: (cm) 2.5 Area: (cm) 1.571 Volume: (cm) 3.927 Chhim, Lachina A ([id number]) % Reduction in Area: 85% % Reduction in Volume: 87.5% Epithelialization: None Tunneling: Yes Location 1 Position (o'clock): 12 Maximum Distance: (cm) 4.2 Location 2 [id number] Page 10 of 12 Location 2 Position (o'clock): 2 Maximum Distance: (cm) 5 Undermining: Yes Starting Position (o'clock): 12 Ending Position (o'clock): 12 Maximum Distance: (cm) 3 Wound Description Classification: Full Thickness With Exposed Support Structures Wound Margin: Well defined, not attached Exudate Amount: Large Exudate Type: Serosanguineous Exudate Color: red, brown Foul Odor After Cleansing: No Slough/Fibrino Yes Wound Bed Granulation Amount: Large (67-100%) Exposed Structure Granulation Quality: Pink Fascia Exposed: No Necrotic Amount: Small (1-33%) Fat Layer (Subcutaneous Tissue) Exposed: Yes Necrotic Quality: Adherent Slough Tendon Exposed: No Muscle Exposed: No Joint Exposed: No Bone Exposed: Yes Periwound Skin Texture Texture Color No Abnormalities Noted: Yes No Abnormalities Noted: Yes Moisture Temperature / Pain No Abnormalities Noted: Yes Temperature: No Abnormality Tenderness on Palpation: Yes Treatment Notes Wound #2 (Gluteus) Wound Laterality: Right Cleanser Normal Saline Discharge Instruction: Cleanse the wound with Normal Saline prior to applying a clean dressing using gauze sponges, not tissue or cotton balls. Peri-Wound Care Skin Prep Discharge Instruction: Use skin prep as directed Topical Primary Dressing Dakin's Solution 0.25%, 16 (oz) Discharge Instruction: Moisten gauze with Dakin's solution and pack into wound Kerlix AMD Roll Dressing, 4.5x 4.1 (in/in) Discharge Instruction: moisten with Dakin's and pack into wound Secondary Dressing Zetuvit Plus Silicone Border Dressing 5x5 (in/in) Discharge Instruction: Apply silicone border over primary  dressing as directed. Secured With Compression Wrap Compression Stockings Environmental education officer) Signed: 02/09/2022 3:04:08 PM By: Blanche East RN Entered By: Blanche East on 02/09/2022 13:14:45 Markus Jarvis A ([id number]) [id number] Page 11 of 12 -------------------------------------------------------------------------------- Wound Assessment Details Patient Name: Date of Service: Elenor Quinones 02/09/2022 12:45 PM Medical Record Number: [id number] Patient Account Number: [id number] Date of Birth/Sex: Treating RN: 08/15/1940 (82 y.o. Iver Nestle, Jamie Primary Care Nahun Kronberg: Leanna Battles Other Clinician: Referring Anays Detore: Treating Arman Loy/Extender: Kathlyn [place] in Treatment: 11 Wound Status Wound Number: 4 Primary Pressure Ulcer Etiology: Wound Location: Left, Lateral Ankle Wound Open Wounding Event: Pressure Injury Status: Date Acquired: 12/23/2021 Comorbid Cataracts, Arrhythmia, Hypertension, Osteoarthritis, Weeks Of Treatment: 5 History: Osteomyelitis, Neuropathy, Confinement Anxiety Clustered Wound: No Photos Wound Measurements Length: (cm) 0.4 Width: (cm) 0.3 Depth: (cm) 0.1 Area: (cm) 0.094 Volume: (cm) 0.009 % Reduction in Area: 33.3% % Reduction in Volume: 35.7% Epithelialization: None Tunneling: No Undermining: No Wound Description Classification: Category/Stage II Wound Margin: Distinct, outline attached Exudate Amount: Medium Exudate Type: Serosanguineous Exudate Color: red, brown Foul Odor After Cleansing: No Slough/Fibrino Yes Wound Bed Granulation Amount: Small (1-33%) Exposed Structure Granulation Quality: Red Fascia Exposed: No Necrotic Amount: Large (67-100%) Fat Layer (Subcutaneous Tissue) Exposed: Yes Necrotic Quality: Adherent Slough Tendon Exposed: No Muscle Exposed: No Joint Exposed: No Bone Exposed: No Periwound Skin Texture Texture Color No Abnormalities  Noted: Yes No Abnormalities Noted: Yes Moisture Temperature / Pain No Abnormalities Noted: Yes Temperature: No Abnormality Treatment Notes Wound #4 (Ankle) Wound Laterality: Left, Lateral Cleanser Normal  Saline Discharge Instruction: Cleanse the wound with Normal Saline prior to applying a clean dressing using gauze sponges, not tissue or cotton balls. Leona SingletonALBERT, Fatma A ([id number]) [id number] Page 12 of 12 Byram Ancillary Kit - 15 Day Supply Discharge Instruction: Use supplies as instructed; Kit contains: (15) Saline Bullets; (15) 3x3 Gauze; 15 pr Gloves Peri-Wound Care Skin Prep Discharge Instruction: Use skin prep as directed Topical Skintegrity Hydrogel 4 (oz) Discharge Instruction: Apply hydrogel as directed Primary Dressing Promogran Prisma Matrix, 4.34 (sq in) (silver collagen) Discharge Instruction: Moisten collagen with saline or hydrogel Secondary Dressing Zetuvit Plus Silicone Border Dressing 5x5 (in/in) Discharge Instruction: Apply silicone border over primary dressing as directed. Secured With [employer], 4.5x3.1 (in/yd) Discharge Instruction: Secure with Kerlix as directed. Compression Wrap Compression Stockings Add-Ons Electronic Signature(s) Signed: 02/09/2022 3:04:08 PM By: Tommie ArdZochol, Jamie RN Entered By: Tommie ArdZochol, Jamie on 02/09/2022 13:06:03 -------------------------------------------------------------------------------- Vitals Details Patient Name: Date of Service: Mannie StabileA LBERT, [name] 02/09/2022 12:45 PM Medical Record Number: [id number] Patient Account Number: [id number] Date of Birth/Sex: Treating RN: 1940/05/07 (82 y.o. Roselee NovaF) Zochol, Jamie Primary Care Jacqui Headen: Jarome MatinPaterson, Daniel Other Clinician: Referring Jorgina Binning: Treating Curtistine Pettitt/Extender: Marena Chancyannon, Jennifer Paterson, Daniel Weeks in Treatment: 11 Vital Signs Time Taken: 12:59 Temperature (F): 97.9 Height (in): 59 Pulse (bpm): 73 Weight (lbs): 155 Respiratory Rate  (breaths/min): 18 Body Mass Index (BMI): 31.3 Blood Pressure (mmHg): 160/84 Reference Range: 80 - 120 mg / dl Electronic Signature(s) Signed: 02/09/2022 3:04:08 PM By: Tommie ArdZochol, Jamie RN Entered By: Tommie ArdZochol, Jamie on 02/09/2022 12:59:56

## 2022-02-09 NOTE — Progress Notes (Signed)
Andrea [name] ([id number]) [id number] Page 1 of 11 Visit Report for 02/09/2022 Chief Complaint Document Details Patient Name: Date of Service: Andrea [name] 02/09/2022 12:45 PM Medical Record Number: [id number] Patient Account Number: [id number] Date of Birth/Sex: Treating RN: 1940/07/08 (82 y.o. F) Primary Care Provider: Jarome Matin Other Clinician: Referring Provider: Treating Provider/Extender: Marena Chancy in Treatment: 11 Information Obtained from: Patient Chief Complaint Patient is at the clinic for treatment of an open pressure ulcer on her right medial ankle, and [name] large abscess on her right buttock Electronic Signature(s) Signed: 02/09/2022 1:34:27 PM By: Duanne Guess MD FACS Entered By: Duanne Guess on 02/09/2022 13:34:27 -------------------------------------------------------------------------------- Debridement Details Patient Name: Date of Service: Andrea [name]. 02/09/2022 12:45 PM Medical Record Number: [id number] Patient Account Number: [id number] Date of Birth/Sex: Treating RN: 03-Jan-1941 (82 y.o. [name] Primary Care Provider: Jarome Matin Other Clinician: Referring Provider: Treating Provider/Extender: Marena Chancy in Treatment: 11 Debridement Performed for Assessment: Wound #1 Right,Medial Malleolus Performed By: Physician Duanne Guess, MD Debridement Type: Debridement Level of Consciousness (Pre-procedure): Awake and Alert Pre-procedure Verification/Time Out Yes - 13:23 Taken: Start Time: 13:24 Pain Control: Lidocaine 5% topical ointment T Area Debrided (L x W): otal 0.8 (cm) x 1 (cm) = 0.8 (cm) Tissue and other material debrided: Non-Viable, Slough, Slough Level: Non-Viable Tissue Debridement Description: Selective/Open Wound Instrument: Curette Bleeding: Minimum Hemostasis Achieved: Pressure Response to Treatment: Procedure was  tolerated well Level of Consciousness (Post- Awake and Alert procedure): Post Debridement Measurements of Total Wound Length: (cm) 0.8 Stage: Category/Stage III Width: (cm) 1 Depth: (cm) 0.1 Volume: (cm) 0.063 Character of Wound/Ulcer Post Debridement: Improved Post Procedure Diagnosis Same as Pre-procedure Andrea [name], Andrea [name] ([id number]) [phone].pdf Page 2 of 11 Notes Scribed for DR. Lady Gary by Tommie Ard, RN Electronic Signature(s) Signed: 02/09/2022 1:39:54 PM By: Duanne Guess MD FACS Signed: 02/09/2022 3:04:08 PM By: Tommie Ard RN Entered By: Tommie Ard on 02/09/2022 13:25:33 -------------------------------------------------------------------------------- Debridement Details Patient Name: Date of Service: Andrea [name] 02/09/2022 12:45 PM Medical Record Number: [id number] Patient Account Number: [id number] Date of Birth/Sex: Treating RN: 1940/09/11 (82 y.o. Roselee Nova, Jamie Primary Care Provider: Jarome Matin Other Clinician: Referring Provider: Treating Provider/Extender: Marena Chancy in Treatment: 11 Debridement Performed for Assessment: Wound #4 Left,Lateral Ankle Performed By: Physician Duanne Guess, MD Debridement Type: Debridement Level of Consciousness (Pre-procedure): Awake and Alert Pre-procedure Verification/Time Out Yes - 13:23 Taken: Start Time: 13:24 Pain Control: Lidocaine 5% topical ointment T Area Debrided (L x W): otal 0.4 (cm) x 0.3 (cm) = 0.12 (cm) Tissue and other material debrided: Non-Viable, Slough, Slough Level: Non-Viable Tissue Debridement Description: Selective/Open Wound Instrument: Curette Bleeding: Minimum Hemostasis Achieved: Pressure Response to Treatment: Procedure was tolerated well Level of Consciousness (Post- Awake and Alert procedure): Post Debridement Measurements of Total Wound Length: (cm) 0.4 Stage: Category/Stage II Width: (cm) 0.3 Depth: (cm)  0.1 Volume: (cm) 0.009 Character of Wound/Ulcer Post Debridement: Improved Post Procedure Diagnosis Same as Pre-procedure Notes Scribed for DR. Lady Gary by Tommie Ard, RN Electronic Signature(s) Signed: 02/09/2022 1:39:54 PM By: Duanne Guess MD FACS Signed: 02/09/2022 3:04:08 PM By: Tommie Ard RN Entered By: Tommie Ard on 02/09/2022 13:26:12 -------------------------------------------------------------------------------- HPI Details Patient Name: Date of Service: Andrea [name] 02/09/2022 12:45 PM Medical Record Number: [id number] Patient Account Number: [id number] Date of Birth/Sex: Treating RN: Nov 28, 1940 (82 y.o. 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 She is not diabetic and quit smoking over 40 years ago. 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 She has developed [name] pressure ulcer on the right medial malleolus. In addition, she apparently has had multiple cutaneous abscesses on her buttocks that have required repeated incision and drainage procedures. She has developed [name] large abscess on her right buttock that has become painful. She was recently treated with cephalexin by her PCP for urinary tract infection and also received [name] shot of Rocephin for the abscess, but it has not yet been incised or drained. She is nonambulatory but is able to stand to transfer. She does not wear any sort of Prevalon boot. ABI in clinic today was 0.96. On her right medial malleolus, there is [name] circular ulcer. The surface is dry and fibrotic. There is  some periwound erythema, but no malodor or purulent drainage. On her right buttock, there is [name] large fluctuant bulge about the size of an orange. It is also erythematous and tender. 12/04/2021: The culture from her abscess grew out staph. [name] 10-day course of Bactrim was prescribed and she is currently taking this. Unfortunately, Dakin's solution was not delivered and only 2 x 2 gauze was sent, rather than Kerlix so the packing of the wound has been rather suboptimal. The wound cavity has light slough over all of the surfaces. Although covered, bone is palpable. The medial ankle wound looks about the same with [name] layer of slough accumulation. In addition, her daughter peeled some dry skin off of her foot this morning and she now has denuded areas over the majority of the plantar surface of her right foot. 01/01/2022: The patient has missed several visits and to her daughters report that it is somewhat difficult to get her here on [name] weekly basis. In the interim, she has developed [name] new pressure ulcer on her left lateral malleolus. The plantar surface of her right foot has healed. The cavity on her right buttock has contracted, but it remains quite deep and the trochanter, while not exposed, is palpable at the base. The pressure ulcer on her right medial malleolus is basically unchanged. 02/09/2022: All wounds are little bit smaller today. As the buttock abscess cavity has contracted, it has left some tunneling that has not been adequately packed. Light slough accumulation on both ankle wounds. Electronic Signature(s) Signed: 02/09/2022 1:35:44 PM By: Duanne Guess MD FACS Entered By: Duanne Guess on 02/09/2022 13:35:43 -------------------------------------------------------------------------------- Physical Exam Details Patient Name: Date of Service: Andrea [name] 02/09/2022 12:45 PM Medical Record Number: [id number] Patient Account Number: [id number] Date of Birth/Sex: Treating  RN: [birth year] (81 y.o. F) Primary Care Provider: Jarome Matin Other Clinician: Referring Provider: Treating Provider/Extender: Marena Chancy in Treatment: 11 Constitutional Hypertensive, asymptomatic. . . . no acute distress. Respiratory Normal work of breathing on room air. Notes 02/09/2022: All wounds are little bit smaller today. As the buttock abscess cavity has contracted, it has left some tunneling that has not been adequately packed. Light slough accumulation on both ankle wounds. Electronic Signature(s) Signed: 02/09/2022 1:37:14 PM By: Duanne Guess MD FACS Entered By: Duanne Guess on 02/09/2022 13:37:14 -------------------------------------------------------------------------------- Physician Orders Details Patient Name: Date of Service: Andrea  Einar Pheasant [name]. 02/09/2022 12:45 PM Andrea [name], Andrea [name] ([id number]) [id number] Page 4 of 11 Medical Record Number: [id number] Patient Account Number: [id number] Date of Birth/Sex: Treating RN: [birth year] (82 y.o. 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 - Dr. Lady Gary - room 1 Return [name] Anesthetic (In clinic) Topical Lidocaine 4% applied to wound bed Bathing/ Shower/ Hygiene May shower and wash wound with soap and water. Negative Presssure Wound  Therapy Other: - run insurance for wound vac Off-Loading Multipodus Splint to: - prevalon boot to both feet Turn and reposition every 2 hours Home Health New wound care orders this week; continue Home Health for wound care. May utilize formulary equivalent dressing for wound treatment orders unless otherwise specified. Dressing changes to be completed by Home Health on Monday / Wednesday / Friday except when patient has scheduled visit at [facility]. Other Home Health Orders/Instructions: - [facility] Wound Treatment Wound #1 - Malleolus Wound Laterality: Right, Medial Cleanser: Normal Saline (Generic) Every Other Day/30 Days Discharge Instructions: Cleanse the wound with Normal Saline prior to applying [name] clean dressing using gauze sponges, not tissue or cotton balls. Cleanser: [employer]ram Ancillary Kit - 15 Day Supply (DME) (Generic) Every Other Day/30 Days Discharge Instructions: Use supplies as instructed; Kit contains: (15) Saline Bullets; (15) 3x3 Gauze; 15 pr Gloves Peri-Wound Care: Skin Prep (DME) (Generic) Every Other Day/30 Days Discharge Instructions: Use skin prep as directed Topical: Skintegrity Hydrogel 4 (oz) (Generic) Every Other Day/30 Days Discharge Instructions: Apply hydrogel as directed Prim Dressing: Promogran Prisma Matrix, 4.34 (sq in) (silver collagen) (Dispense As Written) Every Other Day/30 Days ary Discharge Instructions: Moisten collagen with saline or hydrogel Secondary Dressing: Zetuvit Plus Silicone Border Dressing 4x4 (in/in) (DME) (Generic) Every Other Day/30 Days Discharge Instructions: Apply silicone border over primary dressing as directed. Wound #2 - Gluteus Wound Laterality: Right Cleanser: Normal Saline 1 x Per Day/30 Days Discharge Instructions: Cleanse the wound with Normal Saline prior to applying [name] clean dressing using gauze sponges, not tissue or cotton balls. Peri-Wound Care: Skin Prep (DME) (Generic) 1 x Per Day/30 Days Discharge  Instructions: Use skin prep as directed Prim Dressing: Dakin's Solution 0.25%, 16 (oz) (Generic) 1 x Per Day/30 Days ary Discharge Instructions: Moisten gauze with Dakin's solution and pack into wound Prim Dressing: Kerlix AMD Roll Dressing, 4.5x 4.1 (in/in) (DME) (Generic) 1 x Per Day/30 Days ary Discharge Instructions: moisten with Dakin's and pack into wound Andrea [name], Andrea [name] ([id number]) [id number].pdf Page 5 of 11 Secondary Dressing: Zetuvit Plus Silicone Border Dressing 5x5 (in/in) 1 x Per Day/30 Days Discharge Instructions: Apply silicone border over primary dressing as directed. Secured With: [employer], 4.5x3.1 (in/yd) 1 x Per Day/30 Days Discharge Instructions: Secure with Kerlix as directed. Wound #4 - Ankle Wound Laterality: Left, Lateral Cleanser: Normal Saline (Generic) Every Other Day/30 Days Discharge Instructions: Cleanse the wound with Normal Saline prior to applying [name] clean dressing using gauze sponges, not tissue or cotton balls. Cleanser: Byram Ancillary Kit - 15 Day Supply (Generic) Every Other Day/30 Days  Discharge Instructions: Use supplies as instructed; Kit contains: (15) Saline Bullets; (15) 3x3 Gauze; 15 pr Gloves Peri-Wound Care: Skin Prep (Generic) Every Other Day/30 Days Discharge Instructions: Use skin prep as directed Topical: Skintegrity Hydrogel 4 (oz) (Generic) Every Other Day/30 Days Discharge Instructions: Apply hydrogel as directed Prim Dressing: Promogran Prisma Matrix, 4.34 (sq in) (silver collagen) (DME) (Generic) Every Other Day/30 Days ary Discharge Instructions: Moisten collagen with saline or hydrogel Secondary Dressing: Zetuvit Plus Silicone Border Dressing 4x4 (in/in) (DME) (Generic) Every Other Day/30 Days Discharge Instructions: Apply silicone border over primary dressing as directed. Add-Ons: Cotton Tip Applicator, 6 (in) (DME) (Generic) Every Other Day/30 Days Electronic Signature(s) Signed: 02/09/2022  3:04:08 PM By: Tommie Ard RN Signed: 02/09/2022 3:55:34 PM By: Duanne Guess MD FACS Previous Signature: 02/09/2022 1:39:54 PM Version By: Duanne Guess MD FACS Entered By: Tommie Ard on 02/09/2022 14:19:37 -------------------------------------------------------------------------------- Problem List Details Patient Name: Date of Service: Andrea [name] 02/09/2022 12:45 PM Medical Record Number: [id number] Patient Account Number: [id number] Date of Birth/Sex: Treating RN: 30-Jun-1940 (81 y.o. 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 without 01/01/2022 No Yes evidence of necrosis L02.31 Cutaneous abscess of buttock 11/24/2021 No Yes E27.40 Unspecified adrenocortical insufficiency 11/24/2021 No Yes Andrea [name], Andrea [name] ([id number]) [phone].pdf Page 6 of 11 [phone] Long term (current) use of systemic steroids 11/24/2021 No Yes I10 Essential (primary) hypertension 11/24/2021 No Yes Inactive Problems Resolved Problems ICD-10 Code Description Active Date Resolved Date L97.511 Non-pressure chronic ulcer of other part of right foot limited to breakdown of skin 12/04/2021 12/04/2021 Electronic Signature(s) Signed: 02/09/2022 1:34:12 PM By: Duanne Guess MD FACS Entered By: Duanne Guess on 02/09/2022 13:34:12 -------------------------------------------------------------------------------- Progress Note Details Patient Name: Date of Service: Andrea [name] 02/09/2022 12:45 PM Medical Record Number: [id number] Patient Account Number:  [id number] Date of Birth/Sex: Treating RN: 09/19/1940 (81 y.o. 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 She is not diabetic and quit smoking over 40 years ago. 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 She has developed [name] pressure ulcer on the right medial malleolus. In addition, she apparently has had multiple cutaneous abscesses on her buttocks that have required repeated incision and drainage procedures. She has developed [name] large abscess on her right buttock that has become painful. She was recently treated with cephalexin by her PCP for urinary tract infection and also received [name] shot of Rocephin for the abscess, but it has not yet been incised or drained. She is nonambulatory but is able to stand to transfer. She does not wear any sort of Prevalon boot. ABI in clinic today was 0.96. On her right medial malleolus, there is [name] circular ulcer. The surface is dry and fibrotic. There is  some periwound erythema, but no malodor or purulent drainage. On her right buttock, there is [name] large fluctuant bulge about the size of an orange. It is also erythematous and tender. 12/04/2021: The culture from her abscess grew out staph. [name] 10-day course of Bactrim was prescribed and she is currently taking this. Unfortunately, Dakin's solution was not delivered and only 2 x 2 gauze was sent, rather than Kerlix so the packing of the wound has been rather suboptimal. The  wound cavity has light slough over all of the surfaces. Although covered, bone is palpable. The medial ankle wound looks about the same with [name] layer of slough accumulation. In addition, her daughter peeled some dry skin off of her foot this morning and she now has denuded areas over the majority of the plantar surface of her right foot. 01/01/2022: The patient has missed several visits and to her daughters report that it is somewhat difficult to get her here on [name] weekly basis. In the interim, she has developed [name] new pressure ulcer on her left lateral malleolus. The plantar surface of her right foot has healed. The cavity on her right buttock has contracted, but it remains quite deep and the trochanter, while not exposed, is palpable at the base. The pressure ulcer on her right medial malleolus is basically unchanged. 02/09/2022: All wounds are little bit smaller today. As the buttock abscess cavity has contracted, it has left some tunneling that has not been adequately packed. Light slough accumulation on both ankle wounds. Patient History Information obtained from Patient, Caregiver, Chart. Andrea [name], Andrea [name] ([id number]) [id number] Page 7 of 11 Family History Cancer - Father,Maternal Grandparents,Siblings,Child, Heart Disease - Father, Hypertension - Father, Thyroid Problems - Mother, No family history of Diabetes, Hereditary Spherocytosis, Kidney Disease, Lung Disease, Seizures, Stroke, Tuberculosis. Social History Former smoker - quit 91 yr ago, Marital Status - Widowed, Alcohol Use - Never, Drug Use - No History, Caffeine Use - Daily - tea, soda. Medical History Eyes Patient has history of Cataracts - bil extracted Denies history of Glaucoma, Optic Neuritis Cardiovascular Patient has history of Arrhythmia - afib, Hypertension Endocrine Denies history of Type I Diabetes, Type II Diabetes Genitourinary Denies history of End Stage Renal Disease Integumentary  (Skin) Denies history of History of Burn Musculoskeletal Patient has history of Osteoarthritis, Osteomyelitis - pelvic Neurologic Patient has history of Neuropathy Oncologic Denies history of Received Chemotherapy, Received Radiation Psychiatric Patient has history of Confinement Anxiety Denies history of Anorexia/bulimia Hospitalization/Surgery History - TEE. - right ankle fusion. - bil knee replacements. - bil cataract extractions. - posterior lumbar fusion. - vaginal hysterectomy. - cholecystectomy. 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 no acute distress. Vitals Time Taken: 12:59 PM, Height: 59 in, Weight: 155 lbs, BMI: 31.3, Temperature: 97.9 F, Pulse: 73 bpm, Respiratory Rate: 18 breaths/min, Blood Pressure: 160/84 mmHg. Respiratory Normal work of breathing on room air. General Notes: 02/09/2022: All wounds are little bit smaller today. As the buttock abscess cavity has contracted, it has left some tunneling that has not been adequately packed. Light slough accumulation on both ankle wounds. Integumentary (Hair, Skin) Wound #1 status is Open. Original cause of wound was Pressure Injury. The date acquired was: 11/16/2021. The wound has been in treatment 11 weeks. The wound is located on the Right,Medial Malleolus. The wound measures 0.8cm length  x 1cm width x 0.1cm depth; 0.628cm^2 area and 0.063cm^3 volume. There is Fat Layer (Subcutaneous Tissue) exposed. There is no tunneling or undermining noted. There is [name] medium amount of serosanguineous drainage noted. The wound margin is distinct with the outline attached to the wound base. There is small (1-33%) red granulation  within the wound bed. There is [name] large (67-100%) amount of necrotic tissue within the wound bed including Adherent Slough. The periwound skin appearance had no abnormalities noted for texture. The periwound skin appearance had no abnormalities noted for moisture. The periwound skin appearance exhibited: Erythema. The surrounding wound skin color is noted with erythema. Erythema is measured at 1 cm. Periwound temperature was noted as No Abnormality. Wound #2 status is Open. Original cause of wound was Bump. The date acquired was: 09/28/2021. The wound has been in treatment 11 weeks. The wound is located on the Right Gluteus. The wound measures 1cm length x 2cm width x 2.5cm depth; 1.571cm^2 area and 3.927cm^3 volume. There is bone and Fat Layer (Subcutaneous Tissue) exposed. Tunneling has been noted at 12:00 with [name] maximum distance of 4.2cm. There is additional tunneling and at 2:00 with [name] maximum distance of 5cm. Undermining begins at 12:00 and ends at 12:00 with [name] maximum distance of 3cm. There is [name] large amount of serosanguineous drainage noted. The wound margin is well defined and not attached to the wound base. There is large (67-100%) pink granulation within the wound bed. There is [name] small (1-33%) amount of necrotic tissue within the wound bed including Adherent Slough. The periwound skin appearance had no abnormalities noted for texture. The periwound skin appearance had no abnormalities noted for moisture. The periwound skin appearance had no abnormalities noted for color. Periwound temperature was noted as No Abnormality. The periwound has tenderness on palpation. Wound #4 status is Open. Original cause of wound was Pressure Injury. The date acquired was: 12/23/2021. The wound has been in treatment 5 weeks. The NADIRAH, SOCORRO ([id number]) [id number] Page 8 of 11 wound is located on the Left,Lateral Ankle. The wound measures 0.4cm length x 0.3cm width x 0.1cm depth;  0.094cm^2 area and 0.009cm^3 volume. There is Fat Layer (Subcutaneous Tissue) exposed. There is no tunneling or undermining noted. There is [name] medium amount of serosanguineous drainage noted. The wound margin is distinct with the outline attached to the wound base. There is small (1-33%) red granulation within the wound bed. There is [name] large (67-100%) amount of necrotic tissue within the wound bed including Adherent Slough. The periwound skin appearance had no abnormalities noted for texture. The periwound skin appearance had no abnormalities noted for moisture. The periwound skin appearance had no abnormalities noted for color. Periwound temperature was noted as No Abnormality. 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 There was [name] Selective/Open Wound Non-Viable Tissue Debridement with [name] total area of 0.8 sq cm performed by Fredirick Maudlin, MD. With the following instrument(s): Curette to remove Non-Viable tissue/material. Material removed includes [facility] after achieving pain control using Lidocaine 5% topical ointment. No specimens were taken. [name] time out was conducted at 13:23, prior to the start of the procedure. [name] Minimum amount of bleeding was controlled  with Pressure. The procedure was tolerated well. Post Debridement Measurements: 0.8cm length x 1cm width x 0.1cm depth; 0.063cm^3 volume. Post debridement Stage noted as Category/Stage III. Character of Wound/Ulcer Post Debridement is improved. Post procedure Diagnosis Wound #1: Same as Pre-Procedure General Notes:  Scribed for DR. Lady Garyannon by Tommie ArdJamie Zochol, RN. 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 There was [name] Selective/Open Wound Non-Viable Tissue Debridement with [name] total area of 0.12 sq cm performed by Duanne Guessannon, Koltan Portocarrero, MD. With the following instrument(s): Curette to remove Non-Viable tissue/material. [facility] after achieving pain control using Lidocaine 5% topical ointment. No specimens were taken. [name] time out was conducted at 13:23, prior to the start of the procedure. [name] Minimum amount of bleeding was controlled with Pressure. The procedure was tolerated well. Post Debridement Measurements: 0.4cm length x 0.3cm width x 0.1cm depth; 0.009cm^3 volume. Post debridement Stage noted as Category/Stage II. Character of Wound/Ulcer Post Debridement is improved. Post procedure Diagnosis Wound #4: Same as Pre-Procedure General Notes: Scribed for DR. Lady Garyannon by Tommie ArdJamie Zochol, RN. Plan Follow-up Appointments: Return Appointment in 2 weeks. - Dr. Lady Garyannon - room 1 Anesthetic: (In clinic) Topical Lidocaine 4% applied to wound bed Bathing/ Shower/ Hygiene: May shower and wash wound with soap and water. Negative Presssure Wound Therapy: Other: - run insurance for wound vac Off-Loading: Multipodus Splint to: - prevalon boot to both feet Turn and reposition every 2 hours Home Health: New wound care orders this week; continue Home Health for wound care. May utilize formulary equivalent dressing for wound treatment orders unless otherwise specified. Dressing changes to be completed by Home Health on Monday / Wednesday / Friday except [facility] Other Home Health Orders/Instructions: - [facility] WOUND #1: - Malleolus Wound Laterality: Right, Medial Cleanser: Normal Saline (Generic) Every Other Day/30 Days Discharge Instructions: Cleanse the wound with Normal Saline prior to applying [name] clean  dressing using gauze sponges, not tissue or cotton balls. Cleanser: Byram Ancillary Kit - 15 Day Supply (Generic) Every Other Day/30 Days Discharge Instructions: Use supplies as instructed; Kit contains: (15) Saline Bullets; (15) 3x3 Gauze; 15 pr Gloves Peri-Wound Care: Skin Prep (Generic) Every Other Day/30 Days Discharge Instructions: Use skin prep as directed Topical: Skintegrity Hydrogel 4 (oz) (Generic) Every Other Day/30 Days Discharge Instructions: Apply hydrogel as directed Prim Dressing: Promogran Prisma Matrix, 4.34 (sq in) (silver collagen) (Dispense As Written) Every Other Day/30 Days ary Discharge Instructions: Moisten collagen with saline or hydrogel Secondary Dressing: Zetuvit Plus Silicone Border Dressing 5x5 (in/in) (Dispense As Written) Every Other Day/30 Days Discharge Instructions: Apply silicone border over primary dressing as directed. Secured With: [employer] Roll Sterile, 4.5x3.1 (in/yd) (Generic) Every Other Day/30 Days Discharge Instructions: Secure with Kerlix as directed. WOUND #2: - Gluteus Wound Laterality: Right Andrea [name], Andrea [name] ([id number]) [id number] Page 9 of 11 Cleanser: Normal Saline 1 x Per Day/30 Days Discharge Instructions: Cleanse the wound with Normal Saline prior to applying [name] clean dressing using gauze sponges, not tissue or cotton balls. Peri-Wound Care: Skin Prep 1 x Per Day/30 Days Discharge Instructions: Use skin prep as directed Prim Dressing: Dakin's Solution 0.25%, 16 (oz) (Generic) 1 x Per Day/30 Days ary Discharge Instructions: Moisten gauze with Dakin's solution and pack into wound Prim Dressing: Kerlix AMD Roll Dressing, 4.5x 4.1 (in/in) (Generic) 1 x Per Day/30 Days ary Discharge Instructions: moisten with Dakin's and pack into wound Secondary Dressing: Zetuvit Plus Silicone Border Dressing  5x5 (in/in) (Dispense As Written) 1 x Per Day/30 Days Discharge Instructions: Apply silicone border over primary dressing as  directed. WOUND #4: - Ankle Wound Laterality: Left, Lateral Cleanser: Normal Saline (Generic) Every Other Day/30 Days Discharge Instructions: Cleanse the wound with Normal Saline prior to applying [name] clean dressing using gauze sponges, not tissue or cotton balls. Cleanser: Byram Ancillary Kit - 15 Day Supply (Generic) Every Other Day/30 Days Discharge Instructions: Use supplies as instructed; Kit contains: (15) Saline Bullets; (15) 3x3 Gauze; 15 pr Gloves Peri-Wound Care: Skin Prep (Generic) Every Other Day/30 Days Discharge Instructions: Use skin prep as directed Topical: Skintegrity Hydrogel 4 (oz) (Generic) Every Other Day/30 Days Discharge Instructions: Apply hydrogel as directed Prim Dressing: Promogran Prisma Matrix, 4.34 (sq in) (silver collagen) (Dispense As Written) Every Other Day/30 Days ary Discharge Instructions: Moisten collagen with saline or hydrogel Secondary Dressing: Zetuvit Plus Silicone Border Dressing 5x5 (in/in) (Dispense As Written) Every Other Day/30 Days Discharge Instructions: Apply silicone border over primary dressing as directed. Secured With: [employer] Roll Sterile, 4.5x3.1 (in/yd) (Generic) Every Other Day/30 Days Discharge Instructions: Secure with Kerlix as directed. 02/09/2022: All wounds are little bit smaller today. As the buttock abscess cavity has contracted, it has left some tunneling that has not been adequately packed. Light slough accumulation on both ankle wounds. I used [name] curette to debride slough from both ankle wounds. Apparently [facility] has been in contact with the family and they are going to establish wound VAC therapy after today's visit. It will be imperative that the home health nurses get sponge down into the tunneled portions of the deep aspect of her wound. For now, we will continue to pack the wound with Dakin's-moistened gauze. Continue Prisma silver collagen to both ankle wounds. Follow-up in 2 weeks. Electronic Signature(s) Signed: 02/09/2022  1:38:31 PM By: Duanne Guessannon, Oona Trammel MD FACS Entered By: Duanne Guessannon, Vandell Kun on 02/09/2022 13:38:30 -------------------------------------------------------------------------------- HxROS Details Patient Name: Date of Service: Andrea StabileA LBERT, Andrea [name] 02/09/2022 12:45 PM Medical Record Number: [id number] Patient Account Number: [id number] Date of Birth/Sex: Treating RN: 1940/12/08 (81 y.o. F) Primary Care Provider: Jarome MatinPaterson, Daniel Other Clinician: Referring Provider: Treating Provider/Extender: Marena Chancyannon, Daishon Chui Paterson, Daniel Weeks in Treatment: 11 Information Obtained From Patient Caregiver Chart Constitutional Symptoms (General Health) Medical History: Past Medical History Notes: morbid obesity Eyes Medical History: Positive for: Cataracts - bil extracted Negative for: Glaucoma; Optic Neuritis Ear/Nose/Mouth/Throat Medical History: Past Medical History Notes: hard of hearing Cardiovascular Andrea [name], Andrea [name] ([id number]) [id number] Page 10 of 11 Medical History: Positive for: Arrhythmia - afib; Hypertension Gastrointestinal Medical History: Past Medical History Notes: GERD Endocrine Medical History: Negative for: Type I Diabetes; Type II Diabetes Past Medical History Notes: adrenal insufficiency Genitourinary Medical History: Negative for: End Stage Renal Disease Integumentary (Skin) Medical History: Negative for: History of Burn Musculoskeletal Medical History: Positive for: Osteoarthritis; Osteomyelitis - pelvic Past Medical History Notes: psoriatic arthritis, thoracic discitis, displaced spiral fx right tibia Neurologic Medical History: Positive for: Neuropathy [facility] History: Negative for: Received Chemotherapy; Received Radiation Psychiatric Medical History: Positive for: Confinement Anxiety Negative for: Anorexia/bulimia HBO Extended History  Items Eyes: Cataracts Immunizations Pneumococcal Vaccine: Received Pneumococcal Vaccination: Yes Received Pneumococcal Vaccination On or After 60th Birthday: Yes Implantable Devices None Hospitalization / Surgery History Type of Hospitalization/Surgery TEE right ankle fusion bil knee replacements bil cataract extractions posterior lumbar fusion vaginal hysterectomy cholecystectomy Family and Social History Cancer: Yes - Father,Maternal Grandparents,Siblings,Child; Diabetes: No; Heart Disease: Yes - Father; Hereditary Spherocytosis: No; Hypertension:  Yes - Father; Kidney Disease: No; Lung Disease: No; Seizures: No; Stroke: No; Thyroid Problems: Yes - Mother; Tuberculosis: No; Former smoker - quit 40 yr ago; Marital Status - Widowed; Alcohol Use: Never; Drug Use: No History; Caffeine Use: Daily - tea, soda; Financial Concerns: No; Food, Clothing or [address] Andrea [name], SAAR ([id number]) [id number] Page 11 of 11 Needs: No; Support System Lacking: No; Transportation Concerns: No Electronic Signature(s) Signed: 02/09/2022 1:39:54 PM By: Duanne Guess MD FACS Entered By: Duanne Guess on 02/09/2022 13:36:43 -------------------------------------------------------------------------------- SuperBill Details Patient Name: Date of Service: Andrea [name] 02/09/2022 Medical Record Number: [id number] Patient Account Number: [id number] Date of Birth/Sex: Treating RN: January 20, 1941 (81 y.o. 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 L02.31 Cutaneous abscess of buttock E27.40 Unspecified adrenocortical  insufficiency Z79.52 Long term (current) use of systemic steroids I10 Essential (primary) hypertension Facility Procedures : CPT4 Code: [id number] Description: 97597 - DEBRIDE WOUND 1ST 20 SQ CM OR < ICD-10 Diagnosis Description 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, stage 3 L89.522 Pressure ulcer of left ankle, stage 2 Modifier: Quantity: 1 Physician Procedures : CPT4 Code Description Modifier [id number] - WC PHYS LEVEL 4 - EST PT 25 ICD-10 Diagnosis Description 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, stage 3 L89.522 Pressure ulcer of left ankle, stage 2 L98.415 Non-pressure chronic ulcer of buttock with  muscle involvement without evidence of necrosis Z79.52 Long term (current) use of systemic steroids Quantity: 1 : [id number] 97597 - WC PHYS DEBR WO ANESTH 20 SQ CM ICD-10 Diagnosis Description 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, stage 3 L89.522 Pressure ulcer of left ankle, stage 2 Quantity: 1 Electronic Signature(s) Signed: 02/09/2022 1:38:53 PM By: Duanne Guess MD FACS Entered By: Duanne Guess on 02/09/2022 13:38:53

## 2022-02-26 ENCOUNTER — Encounter (HOSPITAL_BASED_OUTPATIENT_CLINIC_OR_DEPARTMENT_OTHER): Payer: Medicare Other | Attending: General Surgery | Admitting: General Surgery

## 2022-02-26 DIAGNOSIS — I10 Essential (primary) hypertension: Secondary | ICD-10-CM | POA: Insufficient documentation

## 2022-02-26 DIAGNOSIS — L89522 Pressure ulcer of left ankle, stage 2: Secondary | ICD-10-CM | POA: Insufficient documentation

## 2022-02-26 DIAGNOSIS — L98415 Non-pressure chronic ulcer of buttock with muscle involvement without evidence of necrosis: Secondary | ICD-10-CM | POA: Insufficient documentation

## 2022-02-26 DIAGNOSIS — I4891 Unspecified atrial fibrillation: Secondary | ICD-10-CM | POA: Insufficient documentation

## 2022-02-26 DIAGNOSIS — L89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, stage 3: Secondary | ICD-10-CM | POA: Diagnosis present

## 2022-02-26 DIAGNOSIS — M1990 Unspecified osteoarthritis, unspecified site: Secondary | ICD-10-CM | POA: Diagnosis not present

## 2022-02-26 DIAGNOSIS — E2740 Unspecified adrenocortical insufficiency: Secondary | ICD-10-CM | POA: Diagnosis not present

## 2022-02-26 DIAGNOSIS — G629 Polyneuropathy, unspecified: Secondary | ICD-10-CM | POA: Insufficient documentation

## 2022-02-26 NOTE — Progress Notes (Signed)
DYNESHA, WOOLEN [name] ([id number]) [id number] Page 1 of 11 Visit Report for 02/26/2022 Arrival Information Details Patient Name: Date of Service: Andrea [name] 02/26/2022 3:30 PM Medical Record Number: [id number] Patient Account Number: [id number] Date of Birth/Sex: Treating RN: [birth year] (82 y.o. Andrea [name] Primary Care Royer Cristobal: Jarome Matin Other Clinician: Referring Shatia Sindoni: Treating Blessen Kimbrough/Extender: Marena Chancy in Treatment: 13 Visit Information History Since Last Visit Added or deleted any medications: No Patient Arrived: Wheel Chair Any new allergies or adverse reactions: No Arrival Time: 15:42 Had [name] fall or experienced change in No Accompanied By: aide, daughter activities of daily living that may affect Transfer Assistance: Manual risk of falls: Patient Identification Verified: Yes Signs or symptoms of abuse/neglect since last visito No Secondary Verification Process Completed: Yes Hospitalized since last visit: No Patient Requires Transmission-Based Precautions: No Implantable device outside of the clinic excluding No Patient Has Alerts: No cellular tissue based products placed in the center since last visit: Has Dressing in Place as Prescribed: Yes Pain Present Now: No Electronic Signature(s) Signed: 02/26/2022 4:51:54 PM By: Tommie Ard RN Entered By: Tommie Ard on 02/26/2022 16:51:53 -------------------------------------------------------------------------------- Encounter Discharge Information Details Patient Name: Date of Service: Andrea [name] 02/26/2022 3:30 PM Medical Record Number: [id number] Patient Account Number: [id number] Date of Birth/Sex: Treating RN: [birth year] (81 y.o. Andrea [name] Primary Care Montez Cuda: Jarome Matin Other Clinician: Referring Aliany Fiorenza: Treating Tyreanna Bisesi/Extender: Marena Chancy in Treatment: 13 Encounter Discharge Information  Items Post Procedure Vitals Discharge Condition: Stable Temperature (F): 97.6 Ambulatory Status: Ambulatory Pulse (bpm): 90 Discharge Destination: Home Respiratory Rate (breaths/min): 18 Transportation: Private Auto Blood Pressure (mmHg): 147/86 Accompanied By: self Schedule Follow-up Appointment: Yes Clinical Summary of Care: Electronic Signature(s) Signed: 02/26/2022 4:55:07 PM By: Tommie Ard RN Entered By: Tommie Ard on 02/26/2022 16:55:07 Andrea [name] ([id number]) [id number] Page 2 of 11 -------------------------------------------------------------------------------- Lower Extremity Assessment Details Patient Name: Date of Service: Andrea [name] 02/26/2022 3:30 PM Medical Record Number: [id number] Patient Account Number: [id number] Date of Birth/Sex: Treating RN: 1940-05-14 (82 y.o. Andrea [name] Primary Care Clorinda Wyble: Jarome Matin Other Clinician: Referring Dayanara Sherrill: Treating Sophiah Rolin/Extender: Marena Chancy in Treatment: 13 Edema Assessment Assessed: Kyra Searles: No] Franne Forts: No] Edema: [Left: N] [Right: o] Calf Left: Right: Point of Measurement: From Medial Instep 21 cm 31.4 cm Ankle Left: Right: Point of Measurement: From Medial Instep 18 cm 21 cm Vascular Assessment Pulses: Dorsalis Pedis Palpable: [Left:Yes] [Right:Yes] Electronic Signature(s) Signed: 02/26/2022 5:05:53 PM By: Tommie Ard RN Entered By: Tommie Ard on 02/26/2022 16:02:43 -------------------------------------------------------------------------------- Multi Wound Chart Details Patient Name: Date of Service: Andrea [name] 02/26/2022 3:30 PM Medical Record Number: [id number] Patient Account Number: [id number] Date of Birth/Sex: Treating RN: Oct 10, 1940 (81 y.o. F) Primary Care Justo Hengel: Jarome Matin Other Clinician: Referring Koleman Marling: Treating Swayze Kozuch/Extender: Marena Chancy in Treatment:  13 Vital Signs Height(in): 59 Pulse(bpm): 90 Weight(lbs): 155 Blood Pressure(mmHg): 147/86 Body Mass Index(BMI): 31.3 Temperature(F): 97.6 Respiratory Rate(breaths/min): 18 [1:Photos:] [[id number] Page 3 of 11] Right, Medial Malleolus Right Gluteus Left, Lateral Ankle Wound Location: Pressure Injury Bump Pressure Injury Wounding Event: Pressure Ulcer Abscess Pressure Ulcer Primary Etiology: Cataracts, Arrhythmia, Hypertension,Cataracts, Arrhythmia, Hypertension, Cataracts, Arrhythmia, Hypertension, Comorbid History: Osteoarthritis, Osteomyelitis, Osteoarthritis, Osteomyelitis, Osteoarthritis, Osteomyelitis, Neuropathy, Confinement Anxiety Neuropathy, Confinement Anxiety Neuropathy, Confinement Anxiety 11/16/2021 09/28/2021 12/23/2021 Date Acquired: 13 13 8  Weeks of Treatment: Open Open Open Wound Status: No No No Wound Recurrence: 1x1.1x0.1 1x2x2.5 0.4x0.5x0.1 Measurements L x  W x D (cm) 0.864 1.571 0.157 [name] (cm) : rea 0.086 3.927 0.016 Volume (cm) : 43.90% 85.00% -11.30% % Reduction in [name] rea: 44.20% 87.50% -14.30% % Reduction in Volume: Category/Stage III Full Thickness With Exposed Support Category/Stage II Classification: Structures Medium Large Medium Exudate [name] mount: Serosanguineous Serosanguineous Serosanguineous Exudate Type: red, brown red, brown red, brown Exudate Color: Distinct, outline attached Well defined, not attached Distinct, outline attached Wound Margin: Small (1-33%) Large (67-100%) Small (1-33%) Granulation [name] mount: Red Pink Red Granulation Quality: Large (67-100%) Small (1-33%) Large (67-100%) Necrotic [name] mount: Fat Layer (Subcutaneous Tissue): Yes Fat Layer (Subcutaneous Tissue): Yes Fat Layer (Subcutaneous Tissue): Yes Exposed Structures: Fascia: No Bone: Yes Fascia: No Tendon: No Fascia: No Tendon: No Muscle: No Tendon: No Muscle: No Joint: No Muscle: No Joint: No Bone: No Joint: No Bone:  No None None None Epithelialization: Debridement - Selective/Open Wound N/[name] Debridement - Selective/Open Wound Debridement: Pre-procedure Verification/Time Out 16:00 N/[name] 16:00 Taken: N/[name] N/[name] Lidocaine 5% topical ointment Pain Control: [place] Tissue Debrided: Non-Viable Tissue N/[name] Non-Viable Tissue Level: 1.1 N/[name] 0.2 Debridement [name] (sq cm): rea Curette N/[name] Curette Instrument: Minimum N/[name] Minimum Bleeding: Pressure N/[name] Pressure Hemostasis [name] chieved: Procedure was tolerated well N/[name] Procedure was tolerated well Debridement Treatment Response: 1x1.1x0.1 N/[name] 0.4x0.5x0.1 Post Debridement Measurements L x W x D (cm) 0.086 N/[name] 0.016 Post Debridement Volume: (cm) Category/Stage III N/[name] Category/Stage II Post Debridement Stage: No Abnormalities Noted No Abnormalities Noted No Abnormalities Noted Periwound Skin Texture: No Abnormalities Noted No Abnormalities Noted No Abnormalities Noted Periwound Skin Moisture: Erythema: Yes Erythema: Yes No Abnormalities Noted Periwound Skin Color: Measured: 1cm N/[name] N/[name] Erythema Measurement: No Abnormality No Abnormality No Abnormality Temperature: N/[name] Yes N/[name] Tenderness on Palpation: Debridement Negative Pressure Wound Therapy Debridement Procedures Performed: Application (NPWT) Treatment Notes Electronic Signature(s) Signed: 02/26/2022 4:40:00 PM By: Andrea East RN Previous Signature: 02/26/2022 4:29:55 PM Version By: Andrea Maudlin MD FACS Entered By: Andrea [name] on 02/26/2022 16:40:00 -------------------------------------------------------------------------------- Multi-Disciplinary Care Plan Details Patient Name: Date of Service: Elenor Quinones 02/26/2022 3:30 PM Medical Record Number: [id number] Patient Account Number: [id number] Date of Birth/Sex: Treating RN: 09-27-1940 (81 y.o. [name] Primary Care Rolla Servidio: Leanna Battles Other Clinician: Referring Kasten Leveque: Treating Viveca Beckstrom/Extender: Kathlyn [place] in Treatment: [address], Sarben [name] ([id number]) [id number] Page 4 of 11 Multidisciplinary Care Plan reviewed with physician Active Inactive Pressure Nursing Diagnoses: Knowledge deficit related to causes and risk factors for pressure ulcer development Knowledge deficit related to management of pressures ulcers Potential for impaired tissue integrity related to pressure, friction, moisture, and shear Goals: Patient/caregiver will verbalize understanding of pressure ulcer management Date Initiated: 11/24/2021 Target Resolution Date: 03/25/2022 Goal Status: Active Interventions: Assess: immobility, friction, shearing, incontinence upon admission and as needed Assess offloading mechanisms upon admission and as needed Assess potential for pressure ulcer upon admission and as needed Provide education on pressure ulcers Treatment Activities: Pressure reduction/relief device ordered : 11/24/2021 Notes: Soft Tissue Infection Nursing Diagnoses: Impaired tissue integrity Potential for infection: soft tissue Goals: Patient's soft tissue infection will resolve Date Initiated: 11/24/2021 Target Resolution Date: 03/25/2022 Goal Status: Active Signs and symptoms of infection will be recognized early to allow for prompt treatment Date Initiated: 11/24/2021 Date Inactivated: 01/01/2022 Target Resolution Date: 12/22/2021 Goal Status: Met Interventions: Assess signs and symptoms of infection every visit Provide education on infection Treatment Activities: Culture : 11/24/2021 Culture and sensitivity : 11/24/2021 Education provided on Infection : 12/04/2021 Systemic antibiotics : 11/24/2021  Notes: Wound/Skin Impairment Nursing Diagnoses: Impaired tissue integrity Knowledge deficit related to ulceration/compromised skin integrity Goals: Patient/caregiver will verbalize understanding of skin care regimen Date Initiated:  11/24/2021 Target Resolution Date: 03/25/2022 Goal Status: Active Ulcer/skin breakdown will have [name] volume reduction of 30% by week 4 Date Initiated: 11/24/2021 Target Resolution Date: 02/26/2022 Goal Status: Active Interventions: Assess patient/caregiver ability to obtain necessary supplies Assess patient/caregiver ability to perform ulcer/skin care regimen upon admission and as needed Assess ulceration(s) every visit Provide education on ulcer and skin care Treatment Activities: Skin care regimen initiated : 11/24/2021 Topical wound management initiated : 11/24/2021 DELARA, SHEPHEARD ([id number]) [phone].pdf Page 5 of 11 Notes: Electronic Signature(s) Signed: 02/26/2022 4:39:03 PM By: Andrea East RN Entered By: Andrea [name] on 02/26/2022 16:39:03 -------------------------------------------------------------------------------- Negative Pressure Wound Therapy Application (NPWT) Details Patient Name: Date of Service: Elenor Quinones 02/26/2022 3:30 PM Medical Record Number: [id number] Patient Account Number: [id number] Date of Birth/Sex: Treating RN: 1940-07-10 (82 y.o. [name] Primary Care Cheryel Kyte: Leanna Battles Other Clinician: Referring Jenina Moening: Treating Nahima Ales/Extender: Kathlyn Sacramento in Treatment: 13 NPWT Application Performed for: Wound #2 Right Gluteus Performed By: Andrea East, RN Type: VAC System Coverage Size (sq cm): 2 Pressure Type: Constant Pressure Setting: 125 mmHG Drain Type: None Primary Contact: Non-Adherent Sponge/Dressing Type: Foam, Black Date Initiated: 02/26/2022 Response to Treatment: tolerated well Post Procedure Diagnosis Same as Pre-procedure Electronic Signature(s) Signed: 02/26/2022 5:05:53 PM By: Andrea East RN Entered By: Andrea [name] on 02/26/2022 16:05:43 -------------------------------------------------------------------------------- Pain Assessment Details Patient Name: Date  of Service: Elenor Quinones 02/26/2022 3:30 PM Medical Record Number: [id number] Patient Account Number: [id number] Date of Birth/Sex: Treating RN: November 21, 1940 (81 y.o. [name] Primary Care Tyde Lamison: Leanna Battles Other Clinician: Referring Carolyn Maniscalco: Treating Abdiel Blackerby/Extender: Kathlyn Sacramento in Treatment: 13 Active Problems Location of Pain Severity and Description of Pain Patient Has Paino No Site Locations Rate the pain. ADALEI, NOVELL [name] ([id number]) [id number] Page 6 of 11 Rate the pain. Current Pain Level: 0 Pain Management and Medication Current Pain Management: Electronic Signature(s) Signed: 02/26/2022 5:05:53 PM By: Andrea East RN Entered By: Andrea [name] on 02/26/2022 15:43:16 -------------------------------------------------------------------------------- Patient/Caregiver Education Details Patient Name: Date of Service: Elenor Quinones 2/2/2024andnbsp3:30 PM Medical Record Number: [id number] Patient Account Number: [id number] Date of Birth/Gender: Treating RN: 04/07/1940 (82 y.o. [name] Primary Care Physician: Leanna Battles Other Clinician: Referring Physician: Treating Physician/Extender: Kathlyn Sacramento in Treatment: 13 Education Assessment Education Provided To: Patient Education Topics Provided Wound Debridement: Methods: Explain/Verbal Responses: Reinforcements needed, State content correctly Wound/Skin Impairment: Methods: Explain/Verbal Responses: Reinforcements needed, State content correctly Electronic Signature(s) Signed: 02/26/2022 5:05:53 PM By: Andrea East RN Entered By: Andrea [name] on 02/26/2022 16:39:22 -------------------------------------------------------------------------------- Wound Assessment Details Patient Name: Date of Service: Arlana Pouch [name]. 02/26/2022 3:30 PM Annabell Howells ([id number]) [id number] Page  7 of 11 Medical Record Number: [id number] Patient Account Number: [id number] Date of Birth/Sex: Treating RN: [birth year] (82 y.o. Iver Nestle, Jamie Primary Care Stclair Szymborski: Leanna Battles Other Clinician: Referring Diana Davenport: Treating Ilina Xu/Extender: Kathlyn [place] in Treatment: 13 Wound Status Wound Number: 1 Primary Pressure Ulcer Etiology: Wound Location: Right, Medial Malleolus Wound Open Wounding Event: Pressure Injury Status: Date Acquired: 11/16/2021 Comorbid Cataracts, Arrhythmia, Hypertension, Osteoarthritis, Weeks Of Treatment: 13 History: Osteomyelitis, Neuropathy, Confinement Anxiety Clustered Wound: No Photos Wound Measurements Length: (cm) 1 Width: (cm) 1.1 Depth: (cm) 0.1 Area: (cm) 0.864 Volume: (cm) 0.086 % Reduction in  Area: 43.9% % Reduction in Volume: 44.2% Epithelialization: None Tunneling: No Undermining: No Wound Description Classification: Category/Stage III Wound Margin: Distinct, outline attached Exudate Amount: Medium Exudate Type: Serosanguineous Exudate Color: red, brown Foul Odor After Cleansing: No Slough/Fibrino Yes Wound Bed Granulation Amount: Small (1-33%) Exposed Structure Granulation Quality: Red Fascia Exposed: No Necrotic Amount: Large (67-100%) Fat Layer (Subcutaneous Tissue) Exposed: Yes Necrotic Quality: Adherent Slough Tendon Exposed: No Muscle Exposed: No Joint Exposed: No Bone Exposed: No Periwound Skin Texture Texture Color No Abnormalities Noted: Yes No Abnormalities Noted: No Erythema: Yes Moisture Erythema Measurement: Measured No Abnormalities Noted: Yes 1 cm Temperature / Pain Temperature: No Abnormality Treatment Notes Wound #1 (Malleolus) Wound Laterality: Right, Medial Cleanser Normal Saline Discharge Instruction: Cleanse the wound with Normal Saline prior to applying [name] clean dressing using gauze sponges, not tissue or cotton balls. Byram Ancillary Kit - 15 Day  Supply Discharge Instruction: Use supplies as instructed; Kit contains: (15) Saline Bullets; (15) 3x3 Gauze; 15 pr Gloves Peri-Wound Care Skin Prep Discharge Instruction: Use skin prep as directed KATERIA, CUTRONA [name] ([id number]) [phone].pdf Page 8 of 11 Topical Skintegrity Hydrogel 4 (oz) Discharge Instruction: Apply hydrogel as directed Primary Dressing Promogran Prisma Matrix, 4.34 (sq in) (silver collagen) Discharge Instruction: Moisten collagen with saline or hydrogel Secondary Dressing Zetuvit Plus Silicone Border Dressing 4x4 (in/in) Discharge Instruction: Apply silicone border over primary dressing as directed. Secured With Compression Wrap Compression Stockings Environmental education officer) Signed: 02/26/2022 5:05:53 PM By: Andrea East RN Entered By: Andrea [name] on 02/26/2022 15:48:44 -------------------------------------------------------------------------------- Wound Assessment Details Patient Name: Date of Service: Elenor Quinones 02/26/2022 3:30 PM Medical Record Number: [id number] Patient Account Number: [id number] Date of Birth/Sex: Treating RN: 1940-05-07 (82 y.o. Iver Nestle, Jamie Primary Care Dillyn Menna: Leanna Battles Other Clinician: Referring Maeson Lourenco: Treating Erma Joubert/Extender: Kathlyn [place] in Treatment: 13 Wound Status Wound Number: 2 Primary Abscess Etiology: Wound Location: Right Gluteus Wound Open Wounding Event: Bump Status: Date Acquired: 09/28/2021 Comorbid Cataracts, Arrhythmia, Hypertension, Osteoarthritis, Weeks Of Treatment: 13 History: Osteomyelitis, Neuropathy, Confinement Anxiety Clustered Wound: No Photos Wound Measurements Length: (cm) 1 Width: (cm) 2 Depth: (cm) 2.5 Area: (cm) 1.571 Volume: (cm) 3.927 % Reduction in Area: 85% % Reduction in Volume: 87.5% Epithelialization: None Tunneling: No Undermining: No Wound Description Classification: Full Thickness With Exposed  Support Structures Wound Margin: Well defined, not attached Exudate Amount: Large Donn, Devany [name] ([id number]) Exudate Type: Serosanguineous Exudate Color: red, brown Foul Odor After Cleansing: No Slough/Fibrino Yes [id number] Page 9 of 11 Wound Bed Granulation Amount: Large (67-100%) Exposed Structure Granulation Quality: Pink Fascia Exposed: No Necrotic Amount: Small (1-33%) Fat Layer (Subcutaneous Tissue) Exposed: Yes Necrotic Quality: Adherent Slough Tendon Exposed: No Muscle Exposed: No Joint Exposed: No Bone Exposed: Yes Periwound Skin Texture Texture Color No Abnormalities Noted: Yes No Abnormalities Noted: Yes Moisture Temperature / Pain No Abnormalities Noted: Yes Temperature: No Abnormality Tenderness on Palpation: Yes Treatment Notes Wound #2 (Gluteus) Wound Laterality: Right Cleanser Normal Saline Discharge Instruction: Cleanse the wound with Normal Saline prior to applying [name] clean dressing using gauze sponges, not tissue or cotton balls. Peri-Wound Care Skin Prep Discharge Instruction: Use skin prep as directed Topical Primary Dressing Secondary Dressing Secured With Compression Wrap Compression Stockings Add-Ons NPWT Electronic Signature(s) Signed: 02/26/2022 5:05:53 PM By: Andrea East RN Entered By: Andrea [name] on 02/26/2022 15:56:36 -------------------------------------------------------------------------------- Wound Assessment Details Patient Name: Date of Service: Elenor Quinones 02/26/2022 3:30 PM Medical Record Number: [id number] Patient Account Number: [id number] Date of  Birth/Sex: Treating RN: December 03, 1940 (82 y.o. Andrea [name] Primary Care Marris Frontera: Jarome Matin Other Clinician: Referring Feige Lowdermilk: Treating Kason Benak/Extender: Marena Chancy in Treatment: 13 Wound Status Wound Number: 4 Primary Pressure Ulcer Etiology: Wound Location: Left, Lateral Ankle Wound Open Wounding  Event: Pressure Injury Status: Date Acquired: 12/23/2021 Comorbid Cataracts, Arrhythmia, Hypertension, Osteoarthritis, Weeks Of Treatment: 8 History: Osteomyelitis, Neuropathy, Confinement Anxiety Clustered Wound: No Photos TAWNEY, VANORMAN [name] ([id number]) [id number] Page 10 of 11 Wound Measurements Length: (cm) 0.4 Width: (cm) 0.5 Depth: (cm) 0.1 Area: (cm) 0.157 Volume: (cm) 0.016 % Reduction in Area: -11.3% % Reduction in Volume: -14.3% Epithelialization: None Tunneling: No Undermining: No Wound Description Classification: Category/Stage II Wound Margin: Distinct, outline attached Exudate Amount: Medium Exudate Type: Serosanguineous Exudate Color: red, brown Foul Odor After Cleansing: No Slough/Fibrino Yes Wound Bed Granulation Amount: Small (1-33%) Exposed Structure Granulation Quality: Red Fascia Exposed: No Necrotic Amount: Large (67-100%) Fat Layer (Subcutaneous Tissue) Exposed: Yes Necrotic Quality: Adherent Slough Tendon Exposed: No Muscle Exposed: No Joint Exposed: No Bone Exposed: No Periwound Skin Texture Texture Color No Abnormalities Noted: Yes No Abnormalities Noted: Yes Moisture Temperature / Pain No Abnormalities Noted: Yes Temperature: No Abnormality Treatment Notes Wound #4 (Ankle) Wound Laterality: Left, Lateral Cleanser Normal Saline Discharge Instruction: Cleanse the wound with Normal Saline prior to applying [name] clean dressing using gauze sponges, not tissue or cotton balls. Byram Ancillary Kit - 15 Day Supply Discharge Instruction: Use supplies as instructed; Kit contains: (15) Saline Bullets; (15) 3x3 Gauze; 15 pr Gloves Peri-Wound Care Skin Prep Discharge Instruction: Use skin prep as directed Topical Skintegrity Hydrogel 4 (oz) Discharge Instruction: Apply hydrogel as directed Primary Dressing Promogran Prisma Matrix, 4.34 (sq in) (silver collagen) Discharge Instruction: Moisten collagen with saline or  hydrogel Secondary Dressing Zetuvit Plus Silicone Border Dressing 4x4 (in/in) Discharge Instruction: Apply silicone border over primary dressing as directed. Secured With Compression Wrap Compression Stockings CAELIE, [place] [name] ([id number]) [id number] Page 11 of 11 Add-Ons Engineer, maintenance, 6 (in) [employer]) Signed: 02/26/2022 5:05:53 PM By: Tommie Ard RN Entered By: Tommie Ard on 02/26/2022 15:49:06 -------------------------------------------------------------------------------- Vitals Details Patient Name: Date of Service: Andrea [name] 02/26/2022 3:30 PM Medical Record Number: [id number] Patient Account Number: [id number] Date of Birth/Sex: Treating RN: 1941/01/23 (81 y.o. Roselee Nova, Jamie Primary Care Stori Royse: Jarome Matin Other Clinician: Referring Shaun Runyon: Treating Coleman Kalas/Extender: Marena Chancy in Treatment: 13 Vital Signs Time Taken: 15:42 Temperature (F): 97.6 Height (in): 59 Pulse (bpm): 90 Weight (lbs): 155 Respiratory Rate (breaths/min): 18 Body Mass Index (BMI): 31.3 Blood Pressure (mmHg): 147/86 Reference Range: 80 - 120 mg / dl Electronic Signature(s) Signed: 02/26/2022 5:05:53 PM By: Tommie Ard RN Entered By: Tommie Ard on 02/26/2022 15:43:09

## 2022-02-26 NOTE — Progress Notes (Addendum)
[name], ROSSEN [name] ([id number]) [id number] Page 1 of 11 Visit Report for 02/26/2022 Chief Complaint Document Details Patient Name: Date of Service: Andrea [name] 02/26/2022 3:30 PM Medical Record Number: [id number] Patient Account Number: [id number] Date of Birth/Sex: Treating RN: 1940-02-17 (82 y.o. F) Primary Care Provider: Leanna Battles Other Clinician: Referring Provider: Treating Provider/Extender: Kathlyn Sacramento in Treatment: 13 Information Obtained from: Patient Chief Complaint Patient is at the clinic for treatment of an open pressure ulcer on her right medial ankle, and [name] large abscess on her right buttock Electronic Signature(s) Signed: 02/26/2022 4:30:02 PM By: Fredirick Maudlin MD FACS Entered By: Fredirick Maudlin on 02/26/2022 16:30:02 -------------------------------------------------------------------------------- Debridement Details Patient Name: Date of Service: Andrea [name] 02/26/2022 3:30 PM Medical Record Number: [id number] Patient Account Number: [id number] Date of Birth/Sex: Treating RN: August 04, 1940 (81 y.o. Andrea [name] Primary Care Provider: Leanna Battles Other Clinician: Referring Provider: Treating Provider/Extender: Kathlyn Sacramento in Treatment: 13 Debridement Performed for Assessment: Wound #1 Right,Medial Malleolus Performed By: Physician Fredirick Maudlin, MD Debridement Type: Debridement Level of Consciousness (Pre-procedure): Awake and Alert Pre-procedure Verification/Time Out Yes - 16:00 Taken: Start Time: 16:01 T Area Debrided (L x W): otal 1 (cm) x 1.1 (cm) = 1.1 (cm) Tissue and other material debrided: [place] Level: Non-Viable Tissue Debridement Description: Selective/Open Wound Instrument: Curette Bleeding: Minimum Hemostasis Achieved: Pressure Response to Treatment: Procedure was tolerated well Level of Consciousness (Post- Awake and  Alert procedure): Post Debridement Measurements of Total Wound Length: (cm) 1 Stage: Category/Stage III Width: (cm) 1.1 Depth: (cm) 0.1 Volume: (cm) 0.086 Character of Wound/Ulcer Post Debridement: Requires Further Debridement Post Procedure Diagnosis Same as Pre-procedure Notes Scribed for Dr. Celine Ahr by Blanche East, RN Electronic Signature(s) Signed: 02/26/2022 4:37:57 PM By: Fredirick Maudlin MD FACS Signed: 02/26/2022 5:05:53 PM By: Blanche East RN Entered By: Blanche East on 02/26/2022 16:04:06 Andrea [name] Page 2 of 11 -------------------------------------------------------------------------------- Debridement Details Patient Name: Date of Service: Andrea [name] 02/26/2022 3:30 PM Medical Record Number: [id number] Patient Account Number: [id number] Date of Birth/Sex: Treating RN: Nov 12, 1940 (82 y.o. Andrea [name] Primary Care Provider: Leanna Battles Other Clinician: Referring Provider: Treating Provider/Extender: Kathlyn Sacramento in Treatment: 13 Debridement Performed for Assessment: Wound #4 Left,Lateral Ankle Performed By: Physician Fredirick Maudlin, MD Debridement Type: Debridement Level of Consciousness (Pre-procedure): Awake and Alert Pre-procedure Verification/Time Out Yes - 16:00 Taken: Start Time: 16:01 Pain Control: Lidocaine 5% topical ointment T Area Debrided (L x W): otal 0.4 (cm) x 0.5 (cm) = 0.2 (cm) Tissue and other material debrided: [place] Level: Non-Viable Tissue Debridement Description: Selective/Open Wound Instrument: Curette Bleeding: Minimum Hemostasis Achieved: Pressure Response to Treatment: Procedure was tolerated well Level of Consciousness (Post- Awake and Alert procedure): Post Debridement Measurements of Total Wound Length: (cm) 0.4 Stage: Category/Stage II Width: (cm) 0.5 Depth: (cm) 0.1 Volume: (cm) 0.016 Character of Wound/Ulcer Post  Debridement: Requires Further Debridement Post Procedure Diagnosis Same as Pre-procedure Notes Scribed for Dr. Celine Ahr by Blanche East, RN Electronic Signature(s) Signed: 02/26/2022 4:37:57 PM By: Fredirick Maudlin MD FACS Signed: 02/26/2022 5:05:53 PM By: Blanche East RN Entered By: Blanche East on 02/26/2022 16:04:41 -------------------------------------------------------------------------------- HPI Details Patient Name: Date of Service: Andrea [name] 02/26/2022 3:30 PM Medical Record Number: [id number] Patient Account Number: [id number] Date of Birth/Sex: Treating RN: [birth year] (81 y.o. 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 She is not diabetic and quit smoking over 40 years ago. 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 She has developed [name] pressure ulcer on the right medial malleolus. In addition, she apparently has had multiple cutaneous abscesses on her buttocks that have required repeated incision and drainage procedures. She has developed [name] large abscess on her right buttock that has become painful. She was recently treated with cephalexin by her PCP for urinary tract infection and also received [name] shot of Rocephin for the abscess, but it has not yet been incised or drained. She is nonambulatory but is able to stand to transfer. She does not wear any sort of Prevalon boot. ABI in clinic today was 0.96. On her right medial malleolus, there is [name] circular ulcer. The surface is dry and fibrotic. There is some periwound erythema, but no malodor or purulent drainage. On her right buttock, there is [name] large fluctuant bulge  about the size of an orange. It is also erythematous and tender. 12/04/2021: The culture from her abscess grew out staph. [name] 10-day course of Bactrim was prescribed and she is currently taking this. Unfortunately, Dakin's solution was not delivered and only 2 x 2 gauze was sent, rather than Kerlix so the packing of the wound has been rather suboptimal. The wound cavity has light slough over all of the surfaces. Although covered, bone is palpable. The medial ankle wound looks about the same with [name] layer of slough accumulation. In addition, her daughter peeled some dry skin off of her foot this morning and she now has denuded areas over the majority of the plantar surface of her right AYSHAH, ALVA [name] ([id number]) [id number] Page 3 of 11 foot. 01/01/2022: The patient has missed several visits and to her daughters report that it is somewhat difficult to get her here on [name] weekly basis. In the interim, she has developed [name] new pressure ulcer on her left lateral malleolus. The plantar surface of her right foot has healed. The cavity on her right buttock has contracted, but it remains quite deep and the trochanter, while not exposed, is palpable at the base. The pressure ulcer on her right medial malleolus is basically unchanged. 02/09/2022: All wounds are little bit smaller today. As the buttock abscess cavity has contracted, it has left some tunneling that has not been adequately packed. Light slough accumulation on both ankle wounds. 02/26/2022: All of the wounds continue to contract. There is slough on both ankle wounds. The buttock ulcer is clean. She brought her wound VAC with her today. Electronic Signature(s) Signed: 02/26/2022 4:31:44 PM By: Fredirick Maudlin MD FACS Entered By: Fredirick Maudlin on 02/26/2022 16:31:44 -------------------------------------------------------------------------------- Physical Exam Details Patient Name: Date of Service: Andrea [name] 02/26/2022  3:30 PM Medical Record Number: [id number] Patient Account Number: [id number] Date of Birth/Sex: Treating RN: 07-31-1940 (81 y.o. F) Primary Care Provider: Leanna Battles Other Clinician: Referring Provider: Treating Provider/Extender: Kathlyn Sacramento in Treatment: 13 Constitutional Hypertensive, asymptomatic. . . . no acute distress. Respiratory Normal work of breathing on room air. Notes 02/26/2022: All of the wounds continue to contract. There is slough on both ankle wounds. The buttock ulcer is clean. Electronic Signature(s) Signed: 02/26/2022 4:32:17 PM By: Fredirick Maudlin MD FACS Entered By: Fredirick Maudlin on 02/26/2022  16:32:17 -------------------------------------------------------------------------------- Physician Orders Details Patient Name: Date of Service: Andrea [name] 02/26/2022 3:30 PM Medical Record Number: [id number] Patient Account Number: [id number] Date of Birth/Sex: Treating RN: 1940/10/05 (82 y.o. 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 - Dr. Celine Ahr - room 1 Return [name] Anesthetic (In clinic) Topical Lidocaine 4% applied to wound bed Bathing/ Shower/ Hygiene ADAHLIA, PATILLO [name] ([id number]) [id number] Page 4 of 11 May shower and wash wound with soap and water. Negative Presssure Wound  Therapy Wound #2 Right Gluteus Wound Vac to wound continuously at 146m/hg pressure Other: - run insurance for wound vac Off-Loading Multipodus Splint to: - prevalon boot to both feet Turn and reposition every 2 hours [facility] orders this week; continue Home Health for wound care. May utilize formulary equivalent dressing for wound treatment orders unless otherwise specified. Dressing changes to be completed by [place] Monday / Wednesday / Friday except [facility] Other Home Health Orders/Instructions: - [facility] Wound Treatment Wound #1 - Malleolus Wound Laterality: Right, Medial Cleanser: Normal Saline (Generic) Every Other Day/30 Days Discharge Instructions: Cleanse the wound with Normal Saline prior to applying [name] clean dressing using gauze sponges, not tissue or cotton balls. Cleanser: Byram Ancillary Kit - 15 Day Supply (Generic) Every Other Day/30 Days Discharge Instructions: Use supplies as instructed; Kit contains: (15) Saline Bullets; (15) 3x3 Gauze; 15 pr Gloves Peri-Wound Care: Skin Prep (Generic) Every Other Day/30 Days Discharge Instructions: Use skin prep as directed Topical: Skintegrity Hydrogel 4 (oz) (Generic) Every Other Day/30 Days Discharge Instructions: Apply hydrogel as directed Prim Dressing: Promogran Prisma Matrix, 4.34 (sq in) (silver collagen) (Dispense As Written) Every Other Day/30 Days ary Discharge Instructions: Moisten collagen with saline or hydrogel Secondary Dressing: Zetuvit Plus Silicone Border Dressing 4x4 (in/in) (Generic) Every Other Day/30 Days Discharge Instructions: Apply silicone border over primary dressing as directed. Wound #2 - Gluteus Wound Laterality: Right Cleanser: Normal Saline 1 x Per Day/30 Days Discharge Instructions: Cleanse the wound with Normal Saline prior to applying [name] clean dressing using gauze sponges, not tissue or cotton balls. Peri-Wound Care: Skin Prep  (Generic) 1 x Per Day/30 Days Discharge Instructions: Use skin prep as directed Add-Ons: NPWT 1 x Per Day/30 Days Wound #4 - Ankle Wound Laterality: Left, Lateral Cleanser: Normal Saline (Generic) Every Other Day/30 Days Discharge Instructions: Cleanse the wound with Normal Saline prior to applying [name] clean dressing using gauze sponges, not tissue or cotton balls. Cleanser: [place]ram Ancillary Kit - 15 Day Supply (Generic) Every Other Day/30 Days Discharge Instructions: Use supplies as instructed; Kit contains: (15) Saline Bullets; (15) 3x3 Gauze; 15 pr Gloves Peri-Wound Care: Skin Prep (Generic) Every Other Day/30 Days Discharge Instructions: Use skin prep as directed Topical: Skintegrity Hydrogel 4 (oz) (Generic) Every Other Day/30 Days Discharge Instructions: Apply hydrogel as directed Prim Dressing: Promogran Prisma Matrix, 4.34 (sq in) (silver collagen) (Generic) Every Other Day/30 Days ary Discharge Instructions: Moisten collagen with saline or hydrogel Secondary Dressing: Zetuvit Plus  Silicone Border Dressing 4x4 (in/in) (Generic) Every Other Day/30 Days Discharge Instructions: Apply silicone border over primary dressing as directed. Add-Ons: Cotton Tip Applicator, 6 (in) (Generic) Every Other Day/30 Days Electronic Signature(s) Signed: 02/26/2022 4:53:13 PM By: Fredirick Maudlin MD FACS Signed: 02/26/2022 5:05:53 PM By: Blanche East RN Previous Signature: 02/26/2022 4:37:57 PM Version By: Fredirick Maudlin MD FACS Entered By: Blanche East on 02/26/2022 16:47:48 Andrea [name] ([id number] Page 5 of 11 -------------------------------------------------------------------------------- Problem List Details Patient Name: Date of Service: Andrea [name] 02/26/2022 3:30 PM Medical Record Number: [id number] Patient Account Number: [id number] Date of Birth/Sex: Treating RN: February 28, 1940 (82 y.o. 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 without 01/01/2022 No Yes evidence of necrosis L02.31 Cutaneous abscess of buttock 11/24/2021 No Yes E27.40 Unspecified adrenocortical insufficiency 11/24/2021 No Yes Z79.52 Long term (current) use of systemic steroids 11/24/2021 No Yes I10 Essential (primary) hypertension 11/24/2021 No Yes Inactive Problems Resolved Problems ICD-10 Code Description Active Date Resolved Date L97.511 Non-pressure chronic ulcer of other part of right foot limited to breakdown of skin 12/04/2021 12/04/2021 Electronic Signature(s) Signed: 02/26/2022 4:39:51 PM By: Blanche East RN Signed: 02/26/2022 4:53:13 PM By: Fredirick Maudlin MD FACS Previous Signature: 02/26/2022 4:29:38 PM Version By: Fredirick Maudlin MD FACS Entered By: Blanche East on 02/26/2022 16:39:51 -------------------------------------------------------------------------------- Progress Note Details Patient Name: Date of Service: Andrea [name] 02/26/2022 3:30 PM Medical Record Number: [id number] Patient Account Number: [id number] Date of Birth/Sex: Treating RN: 14-Mar-1940 (81 y.o. 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 She is not diabetic and quit smoking over 40 years ago. 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 She has developed [name] pressure ulcer on the right medial malleolus. In addition, she apparently has had multiple cutaneous abscesses on her buttocks that have required repeated incision and drainage procedures. She has developed [name] large abscess on her right buttock that has become painful. She was recently treated with cephalexin by her PCP for urinary tract infection and also received [name] shot of Rocephin for the abscess, but it has not yet been incised or drained. She is nonambulatory but is able to stand to transfer. She does not wear any sort of Prevalon boot. ABI in clinic today was 0.96. On her right medial malleolus, there is [name] circular ulcer. The surface is dry and fibrotic. There is some periwound erythema, but no malodor or purulent drainage. On her right buttock, there is [name] large fluctuant bulge about the size of an orange. It is also erythematous and tender. 12/04/2021: The culture from her abscess grew out staph. [name] 10-day course of Bactrim was prescribed and she is currently taking this. Unfortunately,  Dakin's solution was not delivered and only 2 x 2 gauze was sent, rather than Kerlix so the packing of the wound has been rather suboptimal. The wound cavity has light slough over all of the surfaces. Although covered, bone is palpable. The medial ankle wound looks about the same with [name] layer of slough accumulation. In addition, her daughter peeled some dry skin off of her foot this morning and she now has denuded areas over the majority of the plantar surface of her right foot. 01/01/2022: The patient has missed several visits and to her daughters report that it is somewhat difficult to get her  here on [name] weekly basis. In the interim, she has developed [name] new pressure ulcer on her left lateral malleolus. The plantar surface of her right foot has healed. The cavity on her right buttock has contracted, but it remains quite deep and the trochanter, while not exposed, is palpable at the base. The pressure ulcer on her right medial malleolus is basically unchanged. 02/09/2022: All wounds are little bit smaller today. As the buttock abscess cavity has contracted, it has left some tunneling that has not been adequately packed. Light slough accumulation on both ankle wounds. 02/26/2022: All of the wounds continue to contract. There is slough on both ankle wounds. The buttock ulcer is clean. She brought her wound VAC with her today. Patient History Information obtained from Patient, Caregiver, Chart. Family History Cancer - Father,Maternal Grandparents,Siblings,Child, Heart Disease - Father, Hypertension - Father, Thyroid Problems - Mother, No family history of Diabetes, Hereditary Spherocytosis, Kidney Disease, Lung Disease, Seizures, Stroke, Tuberculosis. Social History Former smoker - quit 23 yr ago, Marital Status - Widowed, Alcohol Use - Never, Drug Use - No History, Caffeine Use - Daily - tea, soda. Medical History Eyes Patient has history of Cataracts - bil extracted Denies history of Glaucoma, Optic Neuritis Cardiovascular Patient has history of Arrhythmia - afib, Hypertension Endocrine Denies history of Type I Diabetes, Type II Diabetes Genitourinary Denies history of End Stage Renal Disease Integumentary (Skin) Denies history of History of Burn Musculoskeletal Patient has history of Osteoarthritis, Osteomyelitis - pelvic Neurologic Patient has history of Neuropathy Oncologic Denies history of Received Chemotherapy, Received Radiation Psychiatric Patient has history of Confinement Anxiety Denies history of Anorexia/bulimia Hospitalization/Surgery History - TEE. - right ankle  fusion. - bil knee replacements. - bil cataract extractions. - posterior lumbar fusion. - vaginal hysterectomy. - cholecystectomy. Medical [name] Surgical History Notes nd Constitutional Symptoms (General Health) morbid obesity Ear/Nose/Mouth/Throat hard of hearing Gastrointestinal GERD Endocrine adrenal insufficiency Musculoskeletal psoriatic arthritis, thoracic discitis, displaced spiral fx right tibia Neurologic hx SAH TARISHA, HERBST [name] ([id number] Page 7 of 11 Objective Constitutional Hypertensive, asymptomatic. no acute distress. Vitals Time Taken: 3:42 PM, Height: 59 in, Weight: 155 lbs, BMI: 31.3, Temperature: 97.6 F, Pulse: 90 bpm, Respiratory Rate: 18 breaths/min, Blood Pressure: 147/86 mmHg. Respiratory Normal work of breathing on room air. General Notes: 02/26/2022: All of the wounds continue to contract. There is slough on both ankle wounds. The buttock ulcer is clean. Integumentary (Hair, Skin) Wound #1 status is Open. Original cause of wound was Pressure Injury. The date acquired was: 11/16/2021. The wound has been in treatment 13 weeks. The wound is located on the Right,Medial Malleolus. The wound measures 1cm length x 1.1cm width x 0.1cm depth; 0.864cm^2 area and 0.086cm^3 volume. There is Fat Layer (Subcutaneous Tissue) exposed. There is no tunneling or undermining noted. There is [name] medium amount of serosanguineous drainage noted. The wound margin is distinct  with the outline attached to the wound base. There is small (1-33%) red granulation within the wound bed. There is [name] large (67-100%) amount of necrotic tissue within the wound bed including Adherent Slough. The periwound skin appearance had no abnormalities noted for texture. The periwound skin appearance had no abnormalities noted for moisture. The periwound skin appearance exhibited: Erythema. The surrounding wound skin color is noted with erythema. Erythema is measured at 1 cm.  Periwound temperature was noted as No Abnormality. Wound #2 status is Open. Original cause of wound was Bump. The date acquired was: 09/28/2021. The wound has been in treatment 13 weeks. The wound is located on the Right Gluteus. The wound measures 1cm length x 2cm width x 2.5cm depth; 1.571cm^2 area and 3.927cm^3 volume. There is bone and Fat Layer (Subcutaneous Tissue) exposed. There is no tunneling or undermining noted. There is [name] large amount of serosanguineous drainage noted. The wound margin is well defined and not attached to the wound base. There is large (67-100%) pink granulation within the wound bed. There is [name] small (1-33%) amount of necrotic tissue within the wound bed including Adherent Slough. The periwound skin appearance had no abnormalities noted for texture. The periwound skin appearance had no abnormalities noted for moisture. The periwound skin appearance had no abnormalities noted for color. Periwound temperature was noted as No Abnormality. The periwound has tenderness on palpation. Wound #4 status is Open. Original cause of wound was Pressure Injury. The date acquired was: 12/23/2021. The wound has been in treatment 8 weeks. The wound is located on the Left,Lateral Ankle. The wound measures 0.4cm length x 0.5cm width x 0.1cm depth; 0.157cm^2 area and 0.016cm^3 volume. There is Fat Layer (Subcutaneous Tissue) exposed. There is no tunneling or undermining noted. There is [name] medium amount of serosanguineous drainage noted. The wound margin is distinct with the outline attached to the wound base. There is small (1-33%) red granulation within the wound bed. There is [name] large (67-100%) amount of necrotic tissue within the wound bed including Adherent Slough. The periwound skin appearance had no abnormalities noted for texture. The periwound skin appearance had no abnormalities noted for moisture. The periwound skin appearance had no abnormalities noted for color. Periwound temperature was  noted as No Abnormality. 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 There was [name] Selective/Open Wound Non-Viable Tissue Debridement with [name] total area of 1.1 sq cm performed by Fredirick Maudlin, MD. With the following instrument(s): Curette Material removed includes [facility]. No specimens were taken. [name] time out was conducted at 16:00, prior to the start of the procedure. [name] Minimum amount of bleeding was controlled with Pressure. The procedure was tolerated well. Post Debridement Measurements: 1cm length x 1.1cm width x 0.1cm depth; 0.086cm^3 volume. Post debridement Stage noted as Category/Stage III. Character of Wound/Ulcer Post Debridement requires further debridement. Post procedure Diagnosis Wound #1: Same as Pre-Procedure General Notes: Scribed for Dr. Celine Ahr by Blanche East, RN. 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 There was [name] Selective/Open Wound Non-Viable Tissue Debridement with [name] total area of 0.2 sq cm performed by Fredirick Maudlin, MD. With the  following instrument(s): Curette Material removed includes Slough after achieving pain control using Lidocaine 5% topical ointment. No specimens were taken. [name] time out was conducted at 16:00, prior to the start of the procedure. [name] Minimum amount of bleeding was controlled with Pressure. The procedure was tolerated well. Post Debridement Measurements: 0.4cm length x 0.5cm width x 0.1cm depth; 0.016cm^3 volume. Post debridement Stage noted as Category/Stage  II. Character of Wound/Ulcer Post Debridement requires further debridement. Post procedure Diagnosis Wound #4: Same as Pre-Procedure General Notes: Scribed for Dr. Celine Ahr by Blanche East, RN. ARMIYA, CARIAS [name] ([id number]) [id number] Page 8 of 11 Plan Follow-up Appointments: Return Appointment in 2 weeks. - Dr. Celine Ahr - room 1 Anesthetic: (In clinic) Topical Lidocaine 4% applied to wound bed Bathing/ Shower/ Hygiene: May shower and wash wound with soap and water. Negative Presssure Wound Therapy: Wound #2 Right Gluteus: Wound Vac to wound continuously at 168m/hg pressure Other: - run insurance for wound vac Off-Loading: Multipodus Splint to: - prevalon boot to both feet Turn and reposition every 2 hours Home Health: New wound care orders this week; continue Home Health for wound care. May utilize formulary equivalent dressing for wound treatment orders unless otherwise specified. Dressing changes [facility] be [facility] / Wednesday / Friday [facility] Orders/Instructions: - [facility] WOUND #1: - Malleolus Wound Laterality: Right, Medial Cleanser: Normal Saline (Generic) Every Other Day/30 Days Discharge Instructions: Cleanse the wound with Normal Saline prior to applying [name] clean dressing using gauze sponges, not tissue or cotton balls. Cleanser: Byram Ancillary Kit - 15 Day Supply (Generic) Every Other Day/30 Days Discharge Instructions: Use supplies as instructed; Kit contains: (15) Saline Bullets; (15) 3x3 Gauze; 15 pr Gloves Peri-Wound Care: Skin Prep (Generic) Every Other Day/30 Days Discharge Instructions: Use skin prep as directed Topical: Skintegrity Hydrogel 4 (oz) (Generic) Every Other Day/30 Days Discharge Instructions: Apply hydrogel as directed Prim Dressing: Promogran Prisma Matrix, 4.34 (sq in) (silver collagen) (Dispense As Written) Every Other Day/30  Days ary Discharge Instructions: Moisten collagen with saline or hydrogel Secondary Dressing: Zetuvit Plus Silicone Border Dressing 4x4 (in/in) (Generic) Every Other Day/30 Days Discharge Instructions: Apply silicone border over primary dressing as directed. WOUND #2: - Gluteus Wound Laterality: Right Cleanser: Normal Saline 1 x Per Day/30 Days Discharge Instructions: Cleanse the wound with Normal Saline prior to applying [name] clean dressing using gauze sponges, not tissue or cotton balls. Peri-Wound Care: Skin Prep (Generic) 1 x Per Day/30 Days Discharge Instructions: Use skin prep as directed Add-Ons: NPWT 1 x Per Day/30 Days WOUND #4: - Ankle Wound Laterality: Left, Lateral Cleanser: Normal Saline (Generic) Every Other Day/30 Days Discharge Instructions: Cleanse the wound with Normal Saline prior to applying [name] clean dressing using gauze sponges, not tissue or cotton balls. Cleanser: Byram Ancillary Kit - 15 Day Supply (Generic) Every Other Day/30 Days Discharge Instructions: Use supplies as instructed; Kit contains: (15) Saline Bullets; (15) 3x3 Gauze; 15 pr Gloves Peri-Wound Care: Skin Prep (Generic) Every Other Day/30 Days Discharge Instructions: Use skin prep as directed Topical: Skintegrity Hydrogel 4 (oz) (Generic) Every Other Day/30 Days Discharge Instructions: Apply hydrogel as directed Prim Dressing: Promogran Prisma Matrix, 4.34 (sq in) (silver collagen) (Generic) Every Other Day/30 Days ary Discharge Instructions: Moisten collagen with saline or hydrogel Secondary Dressing: Zetuvit Plus Silicone Border Dressing 4x4 (in/in) (Generic) Every Other Day/30 Days Discharge Instructions: Apply silicone border over primary dressing as directed. Add-Ons: Cotton TEngineer, water 6 (  in) (Generic) Every Other Day/30 Days 02/26/2022: All of the wounds continue to contract. There is slough on both ankle wounds. The buttock ulcer is clean. I used [name] curette to debride slough off of both of the ankle  wound. We will continue Prisma silver collagen with foam border dressings to each of these sites. We will initiate the wound VAC on the buttock ulcer today. Follow-up in 2 weeks. Electronic Signature(s) Signed: 03/14/2022 2:19:36 PM By: Deon Pilling RN, BSN Signed: 03/22/2022 5:14:33 PM By: Fredirick Maudlin MD FACS Previous Signature: 02/26/2022 4:34:25 PM Version By: Fredirick Maudlin MD FACS Entered By: Deon Pilling on 03/14/2022 14:04:35 -------------------------------------------------------------------------------- HxROS Details Patient Name: Date of Service: Andrea [name] 02/26/2022 3:30 PM Medical Record Number: [id number] Patient Account Number: [id number] Date of Birth/Sex: Treating RN: Feb 12, 1940 (81 y.o. F) Primary Care Provider: Leanna Battles Other Clinician: Referring Provider: Treating Provider/Extender: Kathlyn Sacramento in Treatment: [address] [place] ([id number]) [id number] Page 9 of 11 Information Obtained From Patient Caregiver Chart Constitutional Symptoms (General Health) Medical History: Past Medical History Notes: morbid obesity Eyes Medical History: Positive for: Cataracts - bil extracted Negative for: Glaucoma; Optic Neuritis Ear/Nose/Mouth/Throat Medical History: Past Medical History Notes: hard of hearing Cardiovascular Medical History: Positive for: Arrhythmia - afib; Hypertension Gastrointestinal Medical History: Past Medical History Notes: GERD Endocrine Medical History: Negative for: Type I Diabetes; Type II Diabetes Past Medical History Notes: adrenal insufficiency Genitourinary Medical History: Negative for: End Stage Renal Disease Integumentary (Skin) Medical History: Negative for: History of Burn Musculoskeletal Medical History: Positive for: Osteoarthritis; Osteomyelitis - pelvic Past Medical History Notes: psoriatic arthritis, thoracic discitis, displaced spiral fx right  tibia Neurologic Medical History: Positive for: Neuropathy Past Medical History Notes: hx [facility] Oncologic Medical History: Negative for: Received Chemotherapy; Received Radiation Psychiatric Medical History: Positive for: Confinement Anxiety Negative for: Anorexia/bulimia HBO Extended History Items Eyes: Cataracts Immunizations KYMIRA, THERIEN [name] ([id number]) [id number] Page 10 of 11 Pneumococcal Vaccine: Received Pneumococcal Vaccination: Yes Received Pneumococcal Vaccination On or After 60th Birthday: Yes Implantable Devices None Hospitalization / Surgery History Type of Hospitalization/Surgery TEE right ankle fusion bil knee replacements bil cataract extractions posterior lumbar fusion vaginal hysterectomy cholecystectomy Family and Social History Cancer: Yes - Father,Maternal Grandparents,Siblings,Child; Diabetes: No; Heart Disease: Yes - Father; Hereditary Spherocytosis: No; Hypertension: Yes - Father; Kidney Disease: No; Lung Disease: No; Seizures: No; Stroke: No; Thyroid Problems: Yes - Mother; Tuberculosis: No; Former smoker - quit 86 yr ago; Marital Status - Widowed; Alcohol Use: Never; Drug Use: No History; Caffeine Use: Daily - tea, soda; Financial Concerns: No; Food, Clothing or Shelter Needs: No; Support System Lacking: No; Transportation Concerns: No Engineer, maintenance) Signed: 02/26/2022 4:37:57 PM By: Fredirick Maudlin MD FACS Entered By: Fredirick Maudlin on 02/26/2022 16:31:50 -------------------------------------------------------------------------------- SuperBill Details Patient Name: Date of Service: Andrea [name] 02/26/2022 Medical Record Number: [id number] Patient Account Number: [id number] Date of Birth/Sex: Treating RN: 12/12/1940 (81 y.o. 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 L02.31 Cutaneous abscess of buttock E27.40 Unspecified adrenocortical insufficiency Z79.52 Long term (current) use of systemic steroids I10 Essential (primary) hypertension Facility Procedures : CPT4 Code: [id number] Description: [id number] - DEBRIDE WOUND 1ST 20 SQ CM OR < ICD-10 Diagnosis Description  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, stage 3 L89.522 Pressure ulcer of left ankle, stage 2 Modifier: Quantity: 1 : CPT4 Code: [id number] Description: [id number] - WOUND VAC-50 SQ CM OR LESS Modifier: Quantity: 1 Physician Procedures : CPT4 Code Description Modifier [id number] - WC PHYS LEVEL 4 - EST PT 25 ICD-10 Diagnosis Description 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, stage 3 L89.522 Pressure ulcer of left ankle, stage 2 L98.415 Non-pressure chronic ulcer of buttock with  muscle involvement without evidence of necrosis Z79.52 Long term (current) use of systemic steroids TOPANGA, OGANDO [name] ([id number] [id number] - WC PHYS DEBR WO ANESTH 20 SQ CM 1 ICD-10 Diagnosis Description L89.513  Pressure ulcer of right ankle, stage 3 L89.522 Pressure ulcer of left ankle, stage 2 Quantity: 1 Page 11 of 11 Electronic Signature(s) Signed: 02/26/2022 4:34:47 PM By: Fredirick Maudlin MD FACS Entered By: Fredirick Maudlin on 02/26/2022 16:34:47

## 2022-03-12 ENCOUNTER — Encounter (HOSPITAL_BASED_OUTPATIENT_CLINIC_OR_DEPARTMENT_OTHER): Payer: Medicare Other | Admitting: General Surgery

## 2022-03-12 DIAGNOSIS — L89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, stage 3: Secondary | ICD-10-CM | POA: Diagnosis not present

## 2022-03-13 NOTE — Progress Notes (Signed)
IDA, DACKO A ([id number]) [id number] Page 1 of 12 Visit Report for 03/12/2022 Arrival Information Details Patient Name: Date of Service: Andrea Yu 03/12/2022 2:45 PM Medical Record Number: [id number] Patient Account Number: [id number] Date of Birth/Sex: Treating RN: 1940-09-24 (82 y.o. [name] Primary Care Daylyn Christine: Leanna Battles Other Clinician: Referring Talbot Monarch: Treating Jasiya Markie/Extender: Kathlyn [place] in Treatment: 15 Visit Information History Since Last Visit Added or deleted any medications: No Patient Arrived: Wheel Chair Any new allergies or adverse reactions: No Arrival Time: 15:08 Had a fall or experienced change in No Accompanied By: daughter/ caregiver activities of daily living that may affect Transfer Assistance: Manual risk of falls: Patient Identification Verified: Yes Signs or symptoms of abuse/neglect since last visito No Secondary Verification Process Completed: Yes Hospitalized since last visit: No Patient Requires Transmission-Based Precautions: No Implantable device outside of the clinic excluding No Patient Has Alerts: No cellular tissue based products placed in the center since last visit: Has Dressing in Place as Prescribed: Yes Pain Present Now: Yes Electronic Signature(s) Signed: 03/12/2022 4:26:35 PM By: Blanche East RN Entered By: Blanche East on 03/12/2022 15:12:42 -------------------------------------------------------------------------------- Encounter Discharge Information Details Patient Name: Date of Service: Andrea Yu 03/12/2022 2:45 PM Medical Record Number: [id number] Patient Account Number: [id number] Date of Birth/Sex: Treating RN: May 31, 1940 (82 y.o. [name] Primary Care Maci Eickholt: Leanna Battles Other Clinician: Referring Dalbert Stillings: Treating Enolia Koepke/Extender: Kathlyn [place] in Treatment: 15 Encounter Discharge  Information Items Post Procedure Vitals Discharge Condition: Stable Temperature (F): 97.9 Ambulatory Status: Ambulatory Pulse (bpm): 88 Discharge Destination: Home Respiratory Rate (breaths/min): 16 Transportation: Private Auto Blood Pressure (mmHg): 135/81 Accompanied By: daughter Schedule Follow-up Appointment: Yes Clinical Summary of Care: Electronic Signature(s) Signed: 03/12/2022 4:17:31 PM By: Blanche East RN Entered By: Blanche East on 03/12/2022 16:17:31 Markus Jarvis A ([id number].pdf Page 2 of 12 -------------------------------------------------------------------------------- Lower Extremity Assessment Details Patient Name: Date of Service: Andrea Yu 03/12/2022 2:45 PM Medical Record Number: [id number] Patient Account Number: [id number] Date of Birth/Sex: Treating RN: 05/02/1940 (82 y.o. [name] Primary Care Javen Ridings: Leanna Battles Other Clinician: Referring Saburo Luger: Treating Manan Olmo/Extender: Kathlyn [place] in Treatment: 15 Edema Assessment Assessed: Shirlyn Goltz: No] [Right: No] Edema: [Left: N] [Right: o] Calf Left: Right: Point of Measurement: From Medial Instep 31.5 cm 32 cm Ankle Left: Right: Point of Measurement: From Medial Instep 19.4 cm 20.4 cm Vascular Assessment Pulses: Dorsalis Pedis Palpable: [Left:Yes] [Right:Yes] Electronic Signature(s) Signed: 03/12/2022 4:26:35 PM By: Blanche East RN Entered By: Blanche East on 03/12/2022 15:15:28 -------------------------------------------------------------------------------- Multi Wound Chart Details Patient Name: Date of Service: Andrea Yu 03/12/2022 2:45 PM Medical Record Number: [id number] Patient Account Number: [id number] Date of Birth/Sex: Treating RN: 10-Aug-1940 (82 y.o. F) Primary Care Audry Kauzlarich: Leanna Battles Other Clinician: Referring Tamarcus Condie: Treating Sandy Haye/Extender: Kathlyn [place] in Treatment: 15 [1:Photos:] Right, Medial Malleolus Right Gluteus Left, Lateral Ankle Wound Location: Pressure Injury Bump Pressure Injury Wounding Event: Pressure Ulcer Abscess Pressure Ulcer Primary Etiology: Cataracts, Arrhythmia, Hypertension, Cataracts, Arrhythmia, Hypertension, Cataracts, Arrhythmia, Hypertension, Comorbid History: Osteoarthritis, Osteomyelitis, Osteoarthritis, Osteomyelitis, Osteoarthritis, Osteomyelitis, Neuropathy, Confinement Anxiety Neuropathy, Confinement Anxiety Neuropathy, Confinement Anxiety 11/16/2021 09/28/2021 12/23/2021 Date Acquired: AVIYA, TONN ([id number]) [phone].pdf Page 3 of 12 15 15 10 $ Weeks of Treatment: Open Open Open Wound Status: No No No Wound Recurrence: 1.3x1.5x0.2 1x0.5x3.8 0.4x0.5x0.2 Measurements L x W x D (cm) 1.532 0.393 0.157 A (cm) : rea 0.306 1.492 0.031 Volume (  cm) : 0.50% 96.20% -11.30% % Reduction in A rea: -98.70% 95.20% -121.40% % Reduction in Volume: 1 Position 1 (o'clock): 4.8 Maximum Distance 1 (cm): No Yes No Tunneling: Category/Stage III Full Thickness With Exposed Support Category/Stage II Classification: Structures Medium Large Medium Exudate A mount: Serosanguineous Serosanguineous Serosanguineous Exudate Type: red, brown red, brown red, brown Exudate Color: Distinct, outline attached Well defined, not attached Distinct, outline attached Wound Margin: Medium (34-66%) Large (67-100%) Medium (34-66%) Granulation A mount: Red Pink Pink Granulation Quality: Medium (34-66%) Small (1-33%) Medium (34-66%) Necrotic A mount: Fat Layer (Subcutaneous Tissue): Yes Fat Layer (Subcutaneous Tissue): Yes Fat Layer (Subcutaneous Tissue): Yes Exposed Structures: Fascia: No Bone: Yes Fascia: No Tendon: No Fascia: No Tendon: No Muscle: No Tendon: No Muscle: No Joint: No Muscle: No Joint: No Bone: No Joint: No Bone: No None None Small  (1-33%) Epithelialization: Debridement - Excisional N/A Debridement - Excisional Debridement: Pre-procedure Verification/Time Out 15:38 N/A 15:38 Taken: Subcutaneous, Slough N/A Subcutaneous, Slough Tissue Debrided: Skin/Subcutaneous Tissue N/A Skin/Subcutaneous Tissue Level: 1.95 N/A 0.2 Debridement A (sq cm): rea Curette N/A Curette Instrument: Minimum N/A Minimum Bleeding: Pressure N/A Pressure Hemostasis A chieved: Procedure was tolerated well N/A Procedure was tolerated well Debridement Treatment Response: 1.3x1.5x0.1 N/A 0.4x0.5x0.1 Post Debridement Measurements L x W x D (cm) 0.153 N/A 0.016 Post Debridement Volume: (cm) Category/Stage III N/A Category/Stage II Post Debridement Stage: No Abnormalities Noted No Abnormalities Noted No Abnormalities Noted Periwound Skin Texture: No Abnormalities Noted No Abnormalities Noted No Abnormalities Noted Periwound Skin Moisture: Erythema: Yes Erythema: Yes Rubor: Yes Periwound Skin Color: Measured: 1cm N/A N/A Erythema Measurement: No Abnormality No Abnormality No Abnormality Temperature: N/A Yes N/A Tenderness on Palpation: Debridement N/A Debridement Procedures Performed: Treatment Notes Wound #1 (Malleolus) Wound Laterality: Right, Medial Cleanser Normal Saline Discharge Instruction: Cleanse the wound with Normal Saline prior to applying a clean dressing using gauze sponges, not tissue or cotton balls. Byram Ancillary Kit - 15 Day Supply Discharge Instruction: Use supplies as instructed; Kit contains: (15) Saline Bullets; (15) 3x3 Gauze; 15 pr Gloves Peri-Wound Care Skin Prep Discharge Instruction: Use skin prep as directed Topical Skintegrity Hydrogel 4 (oz) Discharge Instruction: Apply hydrogel as directed Primary Dressing Iodosorb Gel 10 (gm) Tube Discharge Instruction: Apply to wound bed as instructed Secondary Dressing Zetuvit Plus Silicone Border Dressing 4x4 (in/in) Discharge Instruction: Apply  silicone border over primary dressing as directed. Secured With Compression Wrap Compression Stockings PERNELLA, REINHOLTZ A ([id number]) [id number] Page 4 of 12 Add-Ons Wound #2 (Gluteus) Wound Laterality: Right Cleanser Normal Saline Discharge Instruction: Cleanse the wound with Normal Saline prior to applying a clean dressing using gauze sponges, not tissue or cotton balls. Peri-Wound Care Skin Prep Discharge Instruction: Use skin prep as directed Topical Primary Dressing Secondary Dressing Secured With Compression Wrap Compression Stockings Add-Ons NPWT Wound #4 (Ankle) Wound Laterality: Left, Lateral Cleanser Normal Saline Discharge Instruction: Cleanse the wound with Normal Saline prior to applying a clean dressing using gauze sponges, not tissue or cotton balls. Byram Ancillary Kit - 15 Day Supply Discharge Instruction: Use supplies as instructed; Kit contains: (15) Saline Bullets; (15) 3x3 Gauze; 15 pr Gloves Peri-Wound Care Skin Prep Discharge Instruction: Use skin prep as directed Topical Skintegrity Hydrogel 4 (oz) Discharge Instruction: Apply hydrogel as directed Primary Dressing Iodosorb Gel 10 (gm) Tube Discharge Instruction: Apply to wound bed as instructed Secondary Dressing Zetuvit Plus Silicone Border Dressing 4x4 (in/in) Discharge Instruction: Apply silicone border over primary dressing as directed. Secured With Compression Wrap Compression Stockings Add-Ons [employer]  Tip Applicator, 6 (in) Electronic Signature(s) Signed: 03/12/2022 3:49:03 PM By: Fredirick Maudlin MD FACS Entered By: Fredirick Maudlin on 03/12/2022 15:49:03 -------------------------------------------------------------------------------- Multi-Disciplinary Care Plan Details Patient Name: Date of Service: Lorain Childes Y A. 03/12/2022 2:45 PM Annabell Howells ([id number] Page 5 of 12 Medical Record Number: [id number] Patient Account  Number: [id number] Date of Birth/Sex: Treating RN: [birth year] (82 y.o. Iver Nestle, Jamie Primary Care Lilliann Rossetti: Leanna Battles Other Clinician: Referring Ivyana Locey: Treating Bartley Vuolo/Extender: Kathlyn [place] in Treatment: [place] reviewed with physician Active Inactive Pressure Nursing Diagnoses: Knowledge deficit related to causes and risk factors for pressure ulcer development Knowledge deficit related to management of pressures ulcers Potential for impaired tissue integrity related to pressure, friction, moisture, and shear Goals: Patient/caregiver will verbalize understanding of pressure ulcer management Date Initiated: 11/24/2021 Target Resolution Date: 03/25/2022 Goal Status: Active Interventions: Assess: immobility, friction, shearing, incontinence upon admission and as needed Assess offloading mechanisms upon admission and as needed Assess potential for pressure ulcer upon admission and as needed Provide education on pressure ulcers Treatment Activities: Pressure reduction/relief device ordered : 11/24/2021 Notes: Soft Tissue Infection Nursing Diagnoses: Impaired tissue integrity Potential for infection: soft tissue Goals: Patient's soft tissue infection will resolve Date Initiated: 11/24/2021 Target Resolution Date: 03/25/2022 Goal Status: Active Signs and symptoms of infection will be recognized early to allow for prompt treatment Date Initiated: 11/24/2021 Date Inactivated: 01/01/2022 Target Resolution Date: 12/22/2021 Goal Status: Met Interventions: Assess signs and symptoms of infection every visit Provide education on infection Treatment Activities: Culture : 11/24/2021 Culture and sensitivity : 11/24/2021 Education provided on Infection : 12/04/2021 Systemic antibiotics : 11/24/2021 Notes: Wound/Skin Impairment Nursing Diagnoses: Impaired tissue integrity Knowledge deficit related to  ulceration/compromised skin integrity Goals: Patient/caregiver will verbalize understanding of skin care regimen Date Initiated: 11/24/2021 Target Resolution Date: 03/25/2022 Goal Status: Active Ulcer/skin breakdown will have a volume reduction of 30% by week 4 Date Initiated: 11/24/2021 Target Resolution Date: 02/26/2022 Goal Status: Active Interventions: Assess patient/caregiver ability to obtain necessary supplies SHERION, KECKLER A ([id number]) [phone].pdf Page 6 of 12 Assess patient/caregiver ability to perform ulcer/skin care regimen upon admission and as needed Assess ulceration(s) every visit Provide education on ulcer and skin care Treatment Activities: Skin care regimen initiated : 11/24/2021 Topical wound management initiated : 11/24/2021 Notes: Electronic Signature(s) Signed: 03/12/2022 4:26:35 PM By: Blanche East RN Entered By: Blanche East on 03/12/2022 15:38:08 -------------------------------------------------------------------------------- Pain Assessment Details Patient Name: Date of Service: Andrea Yu 03/12/2022 2:45 PM Medical Record Number: [id number] Patient Account Number: [id number] Date of Birth/Sex: Treating RN: [birth year] (82 y.o. [name] Primary Care Alyssha Housh: Leanna Battles Other Clinician: Referring Addylin Manke: Treating Kobe Ofallon/Extender: Kathlyn [place] in Treatment: 15 Active Problems Location of Pain Severity and Description of Pain Patient Has Paino Yes Site Locations Rate the pain. Current Pain Level: 0 Pain Management and Medication Current Pain Management: Electronic Signature(s) Signed: 03/12/2022 4:26:35 PM By: Blanche East RN Entered By: Blanche East on 03/12/2022 15:13:06 -------------------------------------------------------------------------------- Patient/Caregiver Education Details Patient Name: Date of Service: Andrea Yu 2/16/2024andnbsp2:45 PM Medical  Record Number: [id number] Patient Account Number: [id number] ARIONAH, SLAWINSKI ([id number]) [phone].pdf Page 7 of 12 Date of Birth/Gender: Treating RN: 1940-10-02 (82 y.o. [name] Primary Care Physician: Leanna Battles Other Clinician: Referring Physician: Treating Physician/Extender: Kathlyn [place] in Treatment: 15 Education Assessment Education Provided To: Patient Education Topics Provided Wound Debridement: Methods: Explain/Verbal Responses: Reinforcements needed, State content correctly Wound/Skin Impairment:  Methods: Explain/Verbal Responses: Reinforcements needed, State content correctly Electronic Signature(s) Signed: 03/12/2022 4:26:35 PM By: Blanche East RN Entered By: Blanche East on 03/12/2022 15:38:36 -------------------------------------------------------------------------------- Wound Assessment Details Patient Name: Date of Service: Andrea Yu 03/12/2022 2:45 PM Medical Record Number: [id number] Patient Account Number: [id number] Date of Birth/Sex: Treating RN: 11-Jul-1940 (82 y.o. Iver Nestle, Jamie Primary Care Oluchi Pucci: Leanna Battles Other Clinician: Referring Merly Hinkson: Treating Taytem Ghattas/Extender: Kathlyn [place] in Treatment: 15 Wound Status Wound Number: 1 Primary Pressure Ulcer Etiology: Wound Location: Right, Medial Malleolus Wound Open Wounding Event: Pressure Injury Status: Date Acquired: 11/16/2021 Comorbid Cataracts, Arrhythmia, Hypertension, Osteoarthritis, Weeks Of Treatment: 15 History: Osteomyelitis, Neuropathy, Confinement Anxiety Clustered Wound: No Photos Wound Measurements Length: (cm) 1.3 Width: (cm) 1.5 Depth: (cm) 0.2 Area: (cm) 1.532 Volume: (cm) 0.306 % Reduction in Area: 0.5% % Reduction in Volume: -98.7% Epithelialization: None Tunneling: No Undermining: No Wound Description Classification: Category/Stage III SHIANA, GROSSE A  ([id number]) Wound Margin: Distinct, outline attache Exudate Amount: Medium Exudate Type: Serosanguineous Exudate Color: red, brown Foul Odor After Cleansing: No [phone].pdf Page 8 of 12 d Slough/Fibrino Yes Wound Bed Granulation Amount: Medium (34-66%) Exposed Structure Granulation Quality: Red Fascia Exposed: No Necrotic Amount: Medium (34-66%) Fat Layer (Subcutaneous Tissue) Exposed: Yes Necrotic Quality: Adherent Slough Tendon Exposed: No Muscle Exposed: No Joint Exposed: No Bone Exposed: No Periwound Skin Texture Texture Color No Abnormalities Noted: Yes No Abnormalities Noted: No Erythema: Yes Moisture Erythema Measurement: Measured No Abnormalities Noted: Yes 1 cm Temperature / Pain Temperature: No Abnormality Treatment Notes Wound #1 (Malleolus) Wound Laterality: Right, Medial Cleanser Normal Saline Discharge Instruction: Cleanse the wound with Normal Saline prior to applying a clean dressing using gauze sponges, not tissue or cotton balls. Byram Ancillary Kit - 15 Day Supply Discharge Instruction: Use supplies as instructed; Kit contains: (15) Saline Bullets; (15) 3x3 Gauze; 15 pr Gloves Peri-Wound Care Skin Prep Discharge Instruction: Use skin prep as directed Topical Skintegrity Hydrogel 4 (oz) Discharge Instruction: Apply hydrogel as directed Primary Dressing Iodosorb Gel 10 (gm) Tube Discharge Instruction: Apply to wound bed as instructed Secondary Dressing Zetuvit Plus Silicone Border Dressing 4x4 (in/in) Discharge Instruction: Apply silicone border over primary dressing as directed. Secured With Compression Wrap Compression Stockings Environmental education officer) Signed: 03/12/2022 4:26:35 PM By: Blanche East RN Entered By: Blanche East on 03/12/2022 15:17:57 -------------------------------------------------------------------------------- Wound Assessment Details Patient Name: Date of Service: Andrea Yu  03/12/2022 2:45 PM Medical Record Number: [id number] Patient Account Number: [id number] Date of Birth/Sex: Treating RN: 1940/10/16 (82 y.o. [name] Primary Care Leeum Sankey: Leanna Battles Other Clinician: Annabell Howells ([id number]) [id number] Page 9 of 12 Referring Kylie Simmonds: Treating Amelia Burgard/Extender: Kathlyn [place] in Treatment: 15 Wound Status Wound Number: 2 Primary Abscess Etiology: Wound Location: Right Gluteus Wound Open Wounding Event: Bump Status: Date Acquired: 09/28/2021 Comorbid Cataracts, Arrhythmia, Hypertension, Osteoarthritis, Weeks Of Treatment: 15 History: Osteomyelitis, Neuropathy, Confinement Anxiety Clustered Wound: No Photos Wound Measurements Length: (cm) 1 Width: (cm) 0.5 Depth: (cm) 3.8 Area: (cm) 0.393 Volume: (cm) 1.492 % Reduction in Area: 96.2% % Reduction in Volume: 95.2% Epithelialization: None Tunneling: Yes Position (o'clock): 1 Maximum Distance: (cm) 4.8 Undermining: No Wound Description Classification: Full Thickness With Exposed Support Structures Wound Margin: Well defined, not attached Exudate Amount: Large Exudate Type: Serosanguineous Exudate Color: red, brown Foul Odor After Cleansing: No Slough/Fibrino Yes Wound Bed Granulation Amount: Large (67-100%) Exposed Structure Granulation Quality: Pink Fascia Exposed: No Necrotic Amount: Small (1-33%) Fat Layer (Subcutaneous Tissue)  Exposed: Yes Necrotic Quality: Adherent Slough Tendon Exposed: No Muscle Exposed: No Joint Exposed: No Bone Exposed: Yes Periwound Skin Texture Texture Color No Abnormalities Noted: Yes No Abnormalities Noted: Yes Moisture Temperature / Pain No Abnormalities Noted: Yes Temperature: No Abnormality Tenderness on Palpation: Yes Treatment Notes Wound #2 (Gluteus) Wound Laterality: Right Cleanser Normal Saline Discharge Instruction: Cleanse the wound with Normal Saline prior to  applying a clean dressing using gauze sponges, not tissue or cotton balls. Peri-Wound Care Skin Prep Discharge Instruction: Use skin prep as directed Topical Primary Dressing MYASIA, ALESSI A ([id number]) [id number] Page 10 of 12 Secondary Dressing Secured With Compression Wrap Compression Stockings Add-Ons NPWT Electronic Signature(s) Signed: 03/12/2022 4:26:35 PM By: Blanche East RN Entered By: Blanche East on 03/12/2022 15:25:45 -------------------------------------------------------------------------------- Wound Assessment Details Patient Name: Date of Service: Andrea Yu 03/12/2022 2:45 PM Medical Record Number: [id number] Patient Account Number: [id number] Date of Birth/Sex: Treating RN: 1941-01-24 (82 y.o. Iver Nestle, Jamie Primary Care Juergen Hardenbrook: Leanna Battles Other Clinician: Referring Janeah Kovacich: Treating Vernice Bowker/Extender: Kathlyn [place] in Treatment: 15 Wound Status Wound Number: 4 Primary Pressure Ulcer Etiology: Wound Location: Left, Lateral Ankle Wound Open Wounding Event: Pressure Injury Status: Date Acquired: 12/23/2021 Comorbid Cataracts, Arrhythmia, Hypertension, Osteoarthritis, Weeks Of Treatment: 10 History: Osteomyelitis, Neuropathy, Confinement Anxiety Clustered Wound: No Photos Wound Measurements Length: (cm) 0.4 Width: (cm) 0.5 Depth: (cm) 0.2 Area: (cm) 0.157 Volume: (cm) 0.031 % Reduction in Area: -11.3% % Reduction in Volume: -121.4% Epithelialization: Small (1-33%) Tunneling: No Undermining: No Wound Description Classification: Category/Stage II Wound Margin: Distinct, outline attached Exudate Amount: Medium Exudate Type: Serosanguineous Exudate Color: red, brown Foul Odor After Cleansing: No Slough/Fibrino Yes Wound Bed Granulation Amount: Medium (34-66%) Exposed Structure Granulation Quality: Pink Fascia Exposed: No Necrotic Amount: Medium (34-66%) Fat Layer  (Subcutaneous Tissue) Exposed: Yes Necrotic Quality: Adherent Slough Tendon Exposed: No Muscle Exposed: No Joint Exposed: No CALII, MCMANIGLE A ([id number]) [id number] Page 11 of 12 Bone Exposed: No Periwound Skin Texture Texture Color No Abnormalities Noted: Yes No Abnormalities Noted: No Rubor: Yes Moisture No Abnormalities Noted: Yes Temperature / Pain Temperature: No Abnormality Treatment Notes Wound #4 (Ankle) Wound Laterality: Left, Lateral Cleanser Normal Saline Discharge Instruction: Cleanse the wound with Normal Saline prior to applying a clean dressing using gauze sponges, not tissue or cotton balls. Byram Ancillary Kit - 15 Day Supply Discharge Instruction: Use supplies as instructed; Kit contains: (15) Saline Bullets; (15) 3x3 Gauze; 15 pr Gloves Peri-Wound Care Skin Prep Discharge Instruction: Use skin prep as directed Topical Skintegrity Hydrogel 4 (oz) Discharge Instruction: Apply hydrogel as directed Primary Dressing Iodosorb Gel 10 (gm) Tube Discharge Instruction: Apply to wound bed as instructed Secondary Dressing Zetuvit Plus Silicone Border Dressing 4x4 (in/in) Discharge Instruction: Apply silicone border over primary dressing as directed. Secured With Compression Wrap Compression Stockings Add-Ons Soil scientist, 6 (in) Electronic Signature(s) Signed: 03/12/2022 4:26:35 PM By: Blanche East RN Entered By: Blanche East on 03/12/2022 15:18:20 -------------------------------------------------------------------------------- Vitals Details Patient Name: Date of Service: Andrea Yu 03/12/2022 2:45 PM Medical Record Number: [id number] Patient Account Number: [id number] Date of Birth/Sex: Treating RN: 07-Mar-1940 (81 y.o. Iver Nestle, Jamie Primary Care Colleen Kotlarz: Leanna Battles Other Clinician: Referring Dartanion Teo: Treating Clerance Umland/Extender: Kathlyn [place] in Treatment: 15 Vital Signs Time  Taken: 15:12 Temperature (F): 97.9 Height (in): 59 Pulse (bpm): 88 Weight (lbs): 155 Respiratory Rate (breaths/min): 16 Body Mass Index (BMI): 31.3 Blood Pressure (mmHg): 135/81 Reference Range: 80 - 120 mg / dl  Electronic Signature(s) TEAGYN, GIERKE A ([id number]) [id number].pdf Page 12 of 12 Signed: 03/12/2022 4:17:53 PM By: Blanche East RN Entered By: Blanche East on 03/12/2022 16:17:53

## 2022-03-13 NOTE — Progress Notes (Signed)
YOLI, CRARY A ([id number]) [id number] Page 1 of 11 Visit Report for 03/12/2022 Chief Complaint Document Details Patient Name: Date of Service: Andrea Yu 03/12/2022 2:45 PM Medical Record Number: [id number] Patient Account Number: [id number] Date of Birth/Sex: Treating RN: April 19, 1940 (82 y.o. F) Primary Care Provider: Leanna Battles Other Clinician: Referring Provider: Treating Provider/Extender: Kathlyn Sacramento in Treatment: 15 Information Obtained from: Patient Chief Complaint Patient is at the clinic for treatment of an open pressure ulcer on her right medial ankle, and a large abscess on her right buttock Electronic Signature(s) Signed: 03/12/2022 3:49:11 PM By: Fredirick Maudlin MD FACS Entered By: Fredirick Maudlin on 03/12/2022 15:49:10 -------------------------------------------------------------------------------- Debridement Details Patient Name: Date of Service: Andrea Yu 03/12/2022 2:45 PM Medical Record Number: [id number] Patient Account Number: [id number] Date of Birth/Sex: Treating RN: 1940/05/11 (82 y.o. Andrea Yu Primary Care Provider: Leanna Battles Other Clinician: Referring Provider: Treating Provider/Extender: Kathlyn Sacramento in Treatment: 15 Debridement Performed for Assessment: Wound #1 Right,Medial Malleolus Performed By: Physician Fredirick Maudlin, MD Debridement Type: Debridement Level of Consciousness (Pre-procedure): Awake and Alert Pre-procedure Verification/Time Out Yes - 15:38 Taken: Start Time: 15:39 T Area Debrided (L x W): otal 1.3 (cm) x 1.5 (cm) = 1.95 (cm) Tissue and other material debrided: Slough, Subcutaneous, Slough Level: Skin/Subcutaneous Tissue Debridement Description: Excisional Instrument: Curette Bleeding: Minimum Hemostasis Achieved: Pressure Response to Treatment: Procedure was tolerated well Level of Consciousness (Post- Awake  and Alert procedure): Post Debridement Measurements of Total Wound Length: (cm) 1.3 Stage: Category/Stage III Width: (cm) 1.5 Depth: (cm) 0.1 Volume: (cm) 0.153 Character of Wound/Ulcer Post Debridement: Requires Further Debridement Post Procedure Diagnosis Same as Pre-procedure Notes BURKLEIGH, HOR A ([id number]) [phone].pdf Page 2 of 11 Scribed for Dr. Celine Ahr by Blanche East, RN Electronic Signature(s) Signed: 03/12/2022 3:54:32 PM By: Fredirick Maudlin MD FACS Signed: 03/12/2022 4:26:35 PM By: Blanche East RN Entered By: Blanche East on 03/12/2022 15:39:54 -------------------------------------------------------------------------------- Debridement Details Patient Name: Date of Service: Andrea Yu 03/12/2022 2:45 PM Medical Record Number: [id number] Patient Account Number: [id number] Date of Birth/Sex: Treating RN: January 18, 1941 (82 y.o. Andrea Yu Primary Care Provider: Leanna Battles Other Clinician: Referring Provider: Treating Provider/Extender: Kathlyn Sacramento in Treatment: 15 Debridement Performed for Assessment: Wound #4 Left,Lateral Ankle Performed By: Physician Fredirick Maudlin, MD Debridement Type: Debridement Level of Consciousness (Pre-procedure): Awake and Alert Pre-procedure Verification/Time Out Yes - 15:38 Taken: Start Time: 15:39 T Area Debrided (L x W): otal 0.4 (cm) x 0.5 (cm) = 0.2 (cm) Tissue and other material debrided: Slough, Subcutaneous, Slough Level: Skin/Subcutaneous Tissue Debridement Description: Excisional Instrument: Curette Bleeding: Minimum Hemostasis Achieved: Pressure Response to Treatment: Procedure was tolerated well Level of Consciousness (Post- Awake and Alert procedure): Post Debridement Measurements of Total Wound Length: (cm) 0.4 Stage: Category/Stage II Width: (cm) 0.5 Depth: (cm) 0.1 Volume: (cm) 0.016 Character of Wound/Ulcer Post Debridement: Requires  Further Debridement Post Procedure Diagnosis Same as Pre-procedure Notes Scribed for Dr. Celine Ahr by Blanche East, RN Electronic Signature(s) Signed: 03/12/2022 3:54:32 PM By: Fredirick Maudlin MD FACS Signed: 03/12/2022 4:26:35 PM By: Blanche East RN Entered By: Blanche East on 03/12/2022 15:40:22 -------------------------------------------------------------------------------- HPI Details Patient Name: Date of Service: Andrea Yu 03/12/2022 2:45 PM Medical Record Number: [id number] Patient Account Number: [id number] Date of Birth/Sex: Treating RN: 1940-09-20 (82 y.o. F) Primary Care Provider: Leanna Battles Other Clinician: Referring Provider: Treating Provider/Extender: Kathlyn Sacramento in Treatment: [address] 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 She is not diabetic and quit smoking over 40 years ago. 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 She has developed a pressure ulcer on the right medial malleolus. In addition, she apparently has had multiple cutaneous abscesses on her buttocks that have required repeated incision and drainage procedures. She has developed a large abscess on her right buttock that has become painful. She was recently treated with cephalexin by her PCP for urinary tract infection and also received a shot of Rocephin for the abscess, but it has not yet been incised or drained. She is nonambulatory but is able to stand to transfer. She does not wear any sort of Prevalon boot. ABI in clinic today was 0.96. On her right medial malleolus, there is a circular ulcer. The surface is dry and fibrotic. There is some periwound erythema, but no malodor or purulent  drainage. On her right buttock, there is a large fluctuant bulge about the size of an orange. It is also erythematous and tender. 12/04/2021: The culture from her abscess grew out staph. A 10-day course of Bactrim was prescribed and she is currently taking this. Unfortunately, Dakin's solution was not delivered and only 2 x 2 gauze was sent, rather than Kerlix so the packing of the wound has been rather suboptimal. The wound cavity has light slough over all of the surfaces. Although covered, bone is palpable. The medial ankle wound looks about the same with a layer of slough accumulation. In addition, her daughter peeled some dry skin off of her foot this morning and she now has denuded areas over the majority of the plantar surface of her right foot. 01/01/2022: The patient has missed several visits and to her daughters report that it is somewhat difficult to get her here on a weekly basis. In the interim, she has developed a new pressure ulcer on her left lateral malleolus. The plantar surface of her right foot has healed. The cavity on her right buttock has contracted, but it remains quite deep and the trochanter, while not exposed, is palpable at the base. The pressure ulcer on her right medial malleolus is basically unchanged. 02/09/2022: All wounds are little bit smaller today. As the buttock abscess cavity has contracted, it has left some tunneling that has not been adequately packed. Light slough accumulation on both ankle wounds. 02/26/2022: All of the wounds continue to contract. There is slough on both ankle wounds. The buttock ulcer is clean. She brought her wound VAC with her today. 03/12/2022: The ankle wounds are about the same this week with slough accumulation. The buttock ulcer is smaller and quite clean. She decided that she could not tolerate the discomfort of the wound VAC and removed it. They have been packing the wound with Dakin's moistened gauze. Electronic Signature(s) Signed:  03/12/2022 3:50:15 PM By: Fredirick Maudlin MD FACS Entered By: Fredirick Maudlin on 03/12/2022 15:50:15 -------------------------------------------------------------------------------- Physical Exam Details Patient Name: Date of Service: Andrea Yu 03/12/2022 2:45 PM Medical Record Number: [id number] Patient Account Number: [id number] Date of Birth/Sex: Treating RN: [birth year] (81 y.o. F) Primary Care Provider: Leanna Battles Other Clinician: Referring Provider: Treating Provider/Extender: Kathlyn Sacramento in Treatment: 15 Constitutional no acute distress. Respiratory Normal work of breathing on room air. Notes 03/12/2022:  The ankle wounds are about the same this week with slough accumulation. The buttock ulcer is smaller and quite clean. Electronic Signature(s) Signed: 03/12/2022 3:50:40 PM By: Fredirick Maudlin MD FACS Entered By: Fredirick Maudlin on 03/12/2022 15:50:40 Physician Orders Details -------------------------------------------------------------------------------- Annabell Howells ([id number]) [id number] Page 4 of 11 Patient Name: Date of Service: Andrea Yu 03/12/2022 2:45 PM Medical Record Number: [id number] Patient Account Number: [id number] Date of Birth/Sex: Treating RN: December 04, 1940 (82 y.o. 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 - Dr. Celine Ahr - room 1 Return A Anesthetic (In clinic) Topical Lidocaine 4% applied to wound bed Bathing/ Shower/ Hygiene May shower and wash wound with soap and water. Off-Loading Multipodus Splint to: - prevalon boot to both feet Turn and reposition every 2 hours [facility] wound care orders this week; continue Home Health for wound care. May utilize formulary equivalent dressing for wound treatment orders unless otherwise specified. - Changing Prisma to Iodosorb/ iodoflex to both ankle wounds. Cover with form border [facility] for this week, pack gluteus wound with Dakins wet to dry dressing Dressing changes to be completed by [facility] on Monday / Wednesday / Friday except when patient has scheduled visit at [facility]. Other Home Health Orders/Instructions: - [facility] Wound Treatment Wound #1 - Malleolus Wound Laterality: Right, Medial Cleanser: Normal Saline (Generic) Every Other Day/30 Days Discharge Instructions: Cleanse the wound with Normal Saline prior to applying a clean dressing using gauze sponges, not tissue or cotton balls. Cleanser: Byram Ancillary Kit - 15 Day Supply (Generic) Every Other Day/30 Days Discharge Instructions: Use supplies as instructed; Kit contains: (15) Saline Bullets; (15) 3x3 Gauze; 15 pr Gloves Peri-Wound Care: Skin Prep (Generic) Every Other Day/30 Days Discharge Instructions: Use skin prep as directed Topical: Skintegrity Hydrogel 4 (oz) (Generic) Every Other Day/30 Days Discharge Instructions: Apply hydrogel as directed Secondary Dressing: Zetuvit Plus Silicone Border Dressing 4x4 (in/in) (Generic) Every Other Day/30 Days Discharge Instructions: Apply silicone border over primary dressing as directed. Wound #2 - Gluteus Wound Laterality: Right Cleanser: Normal Saline 1 x Per Day/30 Days Discharge Instructions: Cleanse the wound with Normal Saline prior to applying a clean  dressing using gauze sponges, not tissue or cotton balls. Peri-Wound Care: Skin Prep (Generic) 1 x Per Day/30 Days Discharge Instructions: Use skin prep as directed Wound #4 - Ankle Wound Laterality: Left, Lateral Cleanser: Normal Saline (Generic) Every Other Day/30 Days Discharge Instructions: Cleanse the wound with Normal Saline prior to applying a clean dressing using gauze sponges, not tissue or cotton balls. Cleanser: Byram Ancillary Kit - 15 Day Supply (Generic) Every Other Day/30 Days Discharge Instructions: Use supplies as instructed; Kit contains: (15) Saline Bullets; (15) 3x3 Gauze; 15 pr Gloves Peri-Wound Care: Skin Prep (Generic) Every Other Day/30 Days Discharge Instructions: Use skin prep as directed ALFRIDA, OELKERS A ([id number]) [phone].pdf Page 5 of 11 Topical: Skintegrity Hydrogel 4 (oz) (Generic) Every Other Day/30 Days Discharge Instructions: Apply hydrogel as directed Prim Dressing: Iodosorb Gel 10 (gm) Tube (DME) (Generic) Every  Other Day/30 Days ary Discharge Instructions: Apply to wound bed as instructed Secondary Dressing: Zetuvit Plus Silicone Border Dressing 4x4 (in/in) (Generic) Every Other Day/30 Days Discharge Instructions: Apply silicone border over primary dressing as directed. Add-Ons: Cotton Tip Applicator, 6 (in) (Generic) Every Other Day/30 Days Electronic Signature(s) Unsigned Previous Signature: 03/12/2022 3:54:32 PM Version By: Fredirick Maudlin MD FACS Entered By: Blanche East on 03/12/2022 16:31:35 -------------------------------------------------------------------------------- Problem List Details Patient Name: Date of Service: Andrea Yu 03/12/2022 2:45 PM Medical Record Number: [id number] Patient Account Number: [id number] Date of Birth/Sex: Treating RN: Apr 22, 1940 (81 y.o. 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 without 01/01/2022 No Yes evidence of necrosis L02.31 Cutaneous abscess of buttock 11/24/2021 No Yes E27.40 Unspecified adrenocortical insufficiency 11/24/2021 No Yes Z79.52 Long term (current) use of systemic steroids 11/24/2021 No Yes I10 Essential (primary) hypertension 11/24/2021 No Yes Inactive Problems Resolved Problems NATASIA, HEROUX A ([id number]) [phone].pdf Page 6 of 11 ICD-10 Code Description Active Date Resolved Date L97.511 Non-pressure chronic ulcer of other part of right foot limited to breakdown of skin 12/04/2021 12/04/2021 Electronic Signature(s) Signed: 03/12/2022 3:48:57 PM By: Fredirick Maudlin MD FACS Entered By: Fredirick Maudlin on 03/12/2022 15:48:56 -------------------------------------------------------------------------------- Progress Note Details Patient Name: Date of Service: Andrea Yu 03/12/2022 2:45 PM Medical Record Number: [id number] Patient Account Number: [id number] Date of Birth/Sex: Treating RN: 1940-07-05 (81 y.o. 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 She is not diabetic and quit smoking over 40 years ago. She has a history of a spiral fracture of  her right leg that resulted in a nonunion. As result, she has an ankle deformity. She has developed a pressure ulcer on the right medial malleolus. In addition, she apparently has had multiple cutaneous abscesses on her buttocks that have required repeated incision and drainage procedures. She has developed a large abscess on her right buttock that has become painful. She was recently treated with cephalexin by her PCP for urinary tract infection and also received a shot of Rocephin for the abscess, but it has not yet been incised or drained. She is nonambulatory but is able to stand to transfer. She does not wear any sort of Prevalon boot. ABI in clinic today was 0.96. On her right medial malleolus, there is a circular ulcer. The surface is dry and fibrotic. There is some periwound erythema, but no malodor or purulent drainage. On her right buttock, there is a large fluctuant bulge about the size of an orange. It is also erythematous and tender. 12/04/2021: The culture from her abscess grew out staph. A 10-day course of Bactrim was prescribed and she is currently taking this. Unfortunately, Dakin's solution was not delivered and only 2 x 2 gauze was sent, rather than Kerlix so the packing of the wound has been rather suboptimal. The wound  cavity has light slough over all of the surfaces. Although covered, bone is palpable. The medial ankle wound looks about the same with a layer of slough accumulation. In addition, her daughter peeled some dry skin off of her foot this morning and she now has denuded areas over the majority of the plantar surface of her right foot. 01/01/2022: The patient has missed several visits and to her daughters report that it is somewhat difficult to get her here on a weekly basis. In the interim, she has developed a new pressure ulcer on her left lateral malleolus. The plantar surface of her right foot has healed. The cavity on her right buttock has contracted, but it remains  quite deep and the trochanter, while not exposed, is palpable at the base. The pressure ulcer on her right medial malleolus is basically unchanged. 02/09/2022: All wounds are little bit smaller today. As the buttock abscess cavity has contracted, it has left some tunneling that has not been adequately packed. Light slough accumulation on both ankle wounds. 02/26/2022: All of the wounds continue to contract. There is slough on both ankle wounds. The buttock ulcer is clean. She brought her wound VAC with her today. 03/12/2022: The ankle wounds are about the same this week with slough accumulation. The buttock ulcer is smaller and quite clean. She decided that she could not tolerate the discomfort of the wound VAC and removed it. They have been packing the wound with Dakin's moistened gauze. Patient History Information obtained from Patient, Caregiver, Chart. Family History Cancer - Father,Maternal Grandparents,Siblings,Child, Heart Disease - Father, Hypertension - Father, Thyroid Problems - Mother, No family history of Diabetes, Hereditary Spherocytosis, Kidney Disease, Lung Disease, Seizures, Stroke, Tuberculosis. Social History Former smoker - quit 13 yr ago, Marital Status - Widowed, Alcohol Use - Never, Drug Use - No History, Caffeine Use - Daily - tea, soda. Medical History Eyes Patient has history of Cataracts - bil extracted Denies history of Glaucoma, Optic Neuritis Cardiovascular Patient has history of Arrhythmia - afib, Hypertension GLODINE, ELICK A ([id number]) [phone].pdf Page 7 of 11 Endocrine Denies history of Type I Diabetes, Type II Diabetes Genitourinary Denies history of End Stage Renal Disease Integumentary (Skin) Denies history of History of Burn Musculoskeletal Patient has history of Osteoarthritis, Osteomyelitis - pelvic Neurologic Patient has history of Neuropathy Oncologic Denies history of Received Chemotherapy, Received  Radiation Psychiatric Patient has history of Confinement Anxiety Denies history of Anorexia/bulimia Hospitalization/Surgery History - TEE. - right ankle fusion. - bil knee replacements. - bil cataract extractions. - posterior lumbar fusion. - vaginal hysterectomy. - cholecystectomy. 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 Vitals Time Taken: 3:12 PM, Height: 59 in, Weight: 155 lbs, BMI: 31.3. Respiratory Normal work of breathing on room air. General Notes: 03/12/2022: The ankle wounds are about the same this week with slough accumulation. The buttock ulcer is smaller and quite clean. Integumentary (Hair, Skin) Wound #1 status is Open. Original cause of wound was Pressure Injury. The date acquired was: 11/16/2021. The wound has been in treatment 15 weeks. The wound is located on the Right,Medial Malleolus. The wound measures 1.3cm length x 1.5cm width x 0.2cm depth; 1.532cm^2 area and 0.306cm^3 volume. There is Fat Layer (Subcutaneous Tissue) exposed. There is no tunneling or undermining noted. There is a medium amount of serosanguineous drainage noted. The  wound margin is distinct with the outline attached to the wound base. There is medium (34-66%) red granulation within the wound bed. There is a medium (34- 66%) amount of necrotic tissue within the wound bed including Adherent Slough. The periwound skin appearance had no abnormalities noted for texture. The periwound skin appearance had no abnormalities noted for moisture. The periwound skin appearance exhibited: Erythema. The surrounding wound skin color is noted with erythema. Erythema is measured at 1 cm. Periwound temperature was noted as  No Abnormality. Wound #2 status is Open. Original cause of wound was Bump. The date acquired was: 09/28/2021. The wound has been in treatment 15 weeks. The wound is located on the Right Gluteus. The wound measures 1cm length x 0.5cm width x 3.8cm depth; 0.393cm^2 area and 1.492cm^3 volume. There is bone and Fat Layer (Subcutaneous Tissue) exposed. There is no undermining noted, however, there is tunneling at 1:00 with a maximum distance of 4.8cm. There is a large amount of serosanguineous drainage noted. The wound margin is well defined and not attached to the wound base. There is large (67-100%) pink granulation within the wound bed. There is a small (1-33%) amount of necrotic tissue within the wound bed including Adherent Slough. The periwound skin appearance had no abnormalities noted for texture. The periwound skin appearance had no abnormalities noted for moisture. The periwound skin appearance had no abnormalities noted for color. Periwound temperature was noted as No Abnormality. The periwound has tenderness on palpation. Wound #4 status is Open. Original cause of wound was Pressure Injury. The date acquired was: 12/23/2021. The wound has been in treatment 10 weeks. The wound is located on the Left,Lateral Ankle. The wound measures 0.4cm length x 0.5cm width x 0.2cm depth; 0.157cm^2 area and 0.031cm^3 volume. There is Fat Layer (Subcutaneous Tissue) exposed. There is no tunneling or undermining noted. There is a medium amount of serosanguineous drainage noted. The wound margin is distinct with the outline attached to the wound base. There is medium (34-66%) pink granulation within the wound bed. There is a medium (34- 66%) amount of necrotic tissue within the wound bed including Adherent Slough. The periwound skin appearance had no abnormalities noted for texture. The periwound skin appearance had no abnormalities noted for moisture. The periwound skin appearance exhibited: Rubor. Periwound  temperature was noted as No Abnormality. 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 There was a Excisional Skin/Subcutaneous Tissue Debridement with a total area of 1.95 sq cm performed by Fredirick Maudlin, MD. With the following instrument(s): Curette Material removed includes Subcutaneous Tissue and Slough and. No specimens were taken. A time out was conducted at 15:38, prior to the start of the procedure. A Minimum amount of bleeding was controlled with Pressure. The procedure was tolerated well. Post Debridement Measurements: 1.3cm length x 1.5cm width x 0.1cm depth; 0.153cm^3 volume. Post debridement Stage noted as Category/Stage III. Character of Wound/Ulcer Post Debridement requires further debridement. Post procedure Diagnosis Wound #1: Same as Pre-Procedure General Notes: Scribed for Dr. Celine Ahr by Blanche East, RN. Wound #4 Pre-procedure diagnosis of Wound #4 is a Pressure Ulcer located on the Left,Lateral Ankle .  There was a Excisional Skin/Subcutaneous Tissue Debridement with a total area of 0.2 sq cm performed by Fredirick Maudlin, MD. With the following instrument(s): Curette Material removed includes Subcutaneous Tissue and Slough and. No specimens were taken. A time out was conducted at 15:38, prior to the start of the procedure. A Minimum amount of bleeding was controlled with Pressure. The procedure was tolerated well. Post Debridement Measurements: 0.4cm length x 0.5cm width x  0.1cm depth; 0.016cm^3 volume. Post debridement Stage noted as Category/Stage II. Character of Wound/Ulcer Post Debridement requires further debridement. Post procedure Diagnosis Wound #4: Same as Pre-Procedure General Notes: Scribed for Dr. Celine Ahr by Blanche East, RN. Plan Follow-up Appointments: Return Appointment in 2 weeks. - Dr. Celine Ahr - room 1 Anesthetic: (In clinic) Topical Lidocaine 4% applied to wound bed Bathing/ Shower/ Hygiene: May shower and wash wound with soap and water. Off-Loading: Multipodus Splint to: - prevalon boot to both feet Turn and reposition every 2 hours Home Health: New wound care orders this week; continue Home Health for wound care. May utilize formulary equivalent dressing for wound treatment orders unless otherwise specified. Dressing changes to be completed by [place] on Monday / Wednesday / Friday except when patient has scheduled visit at [facility]. Other Home Health Orders/Instructions: - [facility] WOUND #1: - Malleolus Wound Laterality: Right, Medial Cleanser: Normal Saline (Generic) Every Other Day/30 Days Discharge Instructions: Cleanse the wound with Normal Saline prior to applying a clean dressing using gauze sponges, not tissue or cotton balls. Cleanser: Byram Ancillary Kit - 15 Day Supply (Generic) Every Other Day/30 Days Discharge Instructions: Use supplies as instructed; Kit contains: (15) Saline Bullets; (15) 3x3 Gauze; 15 pr Gloves Peri-Wound Care: Skin Prep (Generic) Every Other Day/30 Days Discharge Instructions: Use skin prep as directed Topical: Skintegrity Hydrogel 4 (oz) (Generic) Every Other Day/30 Days Discharge Instructions: Apply hydrogel as directed Prim Dressing: Iodosorb Gel 10 (gm) Tube Every Other Day/30 Days ary Discharge Instructions: Apply to wound bed as instructed Secondary Dressing: Zetuvit Plus Silicone Border Dressing 4x4 (in/in) (Generic) Every Other Day/30 Days Discharge Instructions: Apply silicone  border over primary dressing as directed. WOUND #2: - Gluteus Wound Laterality: Right Cleanser: Normal Saline 1 x Per Day/30 Days Discharge Instructions: Cleanse the wound with Normal Saline prior to applying a clean dressing using gauze sponges, not tissue or cotton balls. Peri-Wound Care: Skin Prep (Generic) 1 x Per Day/30 Days Discharge Instructions: Use skin prep as directed Add-Ons: NPWT 1 x Per Day/30 Days WOUND #4: - Ankle Wound Laterality: Left, Lateral Cleanser: Normal Saline (Generic) Every Other Day/30 Days Discharge Instructions: Cleanse the wound with Normal Saline prior to applying a clean dressing using gauze sponges, not tissue or cotton balls. Cleanser: Byram Ancillary Kit - 15 Day Supply (Generic) Every Other Day/30 Days Discharge Instructions: Use supplies as instructed; Kit contains: (15) Saline Bullets; (15) 3x3 Gauze; 15 pr Gloves Peri-Wound Care: Skin Prep (Generic) Every Other Day/30 Days Discharge Instructions: Use skin prep as directed Topical: Skintegrity Hydrogel 4 (oz) (Generic) Every Other Day/30 Days Discharge Instructions: Apply hydrogel as directed Prim Dressing: Iodosorb Gel 10 (gm) Tube (DME) (Generic) Every Other Day/30 Days ary Discharge Instructions: Apply to wound bed as instructed Secondary Dressing: Zetuvit Plus Silicone Border Dressing 4x4 (in/in) (Generic) Every Other Day/30 Days Discharge Instructions: Apply silicone border over primary dressing as directed. Add-Ons: Cotton Tip Applicator, 6 (in) (Generic) Every Other Day/30 Days IA, HANNEMANN A ([id number]) [id number] Page 9 of 11 03/12/2022: The ankle wounds are about the same  this week with slough accumulation. The buttock ulcer is smaller and quite clean. I used a curette to debride slough and nonviable subcutaneous tissue from both of the ankle wounds. I am going to try Iodoflex on both of these sites to see if we can keep them a little bit cleaner and hopefully  stimulate some progress. We will continue to pack the buttock ulcer with Dakin's moistened Kerlix. I pointed out to both the patient's daughter and her caregiver the depth and angle of each of the tunneling tracts to make sure that they know how deep they need to pack the gauze. She will follow-up in 2 weeks. Electronic Signature(s) Signed: 03/12/2022 3:53:41 PM By: Fredirick Maudlin MD FACS Entered By: Fredirick Maudlin on 03/12/2022 15:53:41 -------------------------------------------------------------------------------- HxROS Details Patient Name: Date of Service: Andrea Yu 03/12/2022 2:45 PM Medical Record Number: [id number] Patient Account Number: [id number] Date of Birth/Sex: Treating RN: [birth year] (81 y.o. F) Primary Care Provider: Leanna Battles Other Clinician: Referring Provider: Treating Provider/Extender: Kathlyn Sacramento in Treatment: 15 Information Obtained From Patient Caregiver Chart Constitutional Symptoms (General Health) Medical History: Past Medical History Notes: morbid obesity Eyes Medical History: Positive for: Cataracts - bil extracted Negative for: Glaucoma; Optic Neuritis Ear/Nose/Mouth/Throat Medical History: Past Medical History Notes: hard of hearing Cardiovascular Medical History: Positive for: Arrhythmia - afib; Hypertension Gastrointestinal Medical History: Past Medical History Notes: GERD Endocrine Medical History: Negative for: Type I Diabetes; Type II Diabetes Past Medical History Notes: adrenal insufficiency Genitourinary Medical History: Negative for: End Stage Renal Disease Integumentary (Skin) Medical HistoryNOOMI, SCALA ([id number]) [phone] Page 10 of 11 Negative for: History of Burn Musculoskeletal Medical History: Positive for: Osteoarthritis; Osteomyelitis - pelvic Past Medical History Notes: psoriatic arthritis, thoracic discitis, displaced spiral fx right  tibia Neurologic Medical History: Positive for: Neuropathy Past Medical History Notes: hx [facility] Oncologic Medical History: Negative for: Received Chemotherapy; Received Radiation Psychiatric Medical History: Positive for: Confinement Anxiety Negative for: Anorexia/bulimia HBO Extended History Items Eyes: Cataracts Immunizations Pneumococcal Vaccine: Received Pneumococcal Vaccination: Yes Received Pneumococcal Vaccination On or After 60th Birthday: Yes Implantable Devices None Hospitalization / Surgery History Type of Hospitalization/Surgery TEE right ankle fusion bil knee replacements bil cataract extractions posterior lumbar fusion vaginal hysterectomy cholecystectomy Family and Social History Cancer: Yes - Father,Maternal Grandparents,Siblings,Child; Diabetes: No; Heart Disease: Yes - Father; Hereditary Spherocytosis: No; Hypertension: Yes - Father; Kidney Disease: No; Lung Disease: No; Seizures: No; Stroke: No; Thyroid Problems: Yes - Mother; Tuberculosis: No; Former smoker - quit 28 yr ago; Marital Status - Widowed; Alcohol Use: Never; Drug Use: No History; Caffeine Use: Daily - tea, soda; Financial Concerns: No; Food, Clothing or Shelter Needs: No; Support System Lacking: No; Transportation Concerns: No Electronic Signature(s) Signed: 03/12/2022 3:54:32 PM By: Fredirick Maudlin MD FACS Entered By: Fredirick Maudlin on 03/12/2022 15:50:22 -------------------------------------------------------------------------------- SuperBill Details Patient Name: Date of Service: Andrea Yu 03/12/2022 Medical Record Number: [id number] Patient Account Number: [id number] Date of Birth/Sex: Treating RN: 02/19/1940 (81 y.o. 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 L02.31 Cutaneous abscess of buttock E27.40 Unspecified adrenocortical insufficiency Z79.52 Long term (current) use of systemic steroids I10 Essential (primary) hypertension  Facility Procedures : CPT4 Code: [id number] Description: [id number] - DEB SUBQ TISSUE 20 SQ CM/< ICD-10 Diagnosis Description 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, stage 3 L89.522 Pressure ulcer of left ankle, stage 2 Modifier: Quantity: 1 Physician Procedures : CPT4 Code Description Modifier [id number] - WC PHYS LEVEL 4 - EST PT 25 ICD-10 Diagnosis Description 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

## 2022-03-25 ENCOUNTER — Encounter (HOSPITAL_BASED_OUTPATIENT_CLINIC_OR_DEPARTMENT_OTHER): Payer: Medicare Other | Admitting: General Surgery

## 2022-03-25 DIAGNOSIS — L89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, stage 3: Secondary | ICD-10-CM | POA: Diagnosis not present

## 2022-03-28 NOTE — Progress Notes (Signed)
KAYMARIE, PARTIPILO Yu ([id number]) [id number] Page 1 of 11 Visit Report for 03/25/2022 Chief Complaint Document Details Patient Name: Date of Service: Andrea Yu 03/25/2022 12:45 PM Medical Record Number: [id number] Patient Account Number: [id number] Date of Birth/Sex: Treating RN: 1940/05/09 (82 y.o. F) Primary Care Provider: Leanna Battles Other Clinician: Referring Provider: Treating Provider/Extender: Kathlyn Sacramento in Treatment: 17 Information Obtained from: Patient Chief Complaint Patient is at the clinic for treatment of an open pressure ulcer on her right medial ankle, and Yu large abscess on her right buttock Electronic Signature(s) Signed: 03/25/2022 1:40:16 PM By: Fredirick Maudlin MD FACS Entered By: Fredirick Maudlin on 03/25/2022 13:40:16 -------------------------------------------------------------------------------- Debridement Details Patient Name: Date of Service: Andrea Yu 03/25/2022 12:45 PM Medical Record Number: [id number] Patient Account Number: [id number] Date of Birth/Sex: Treating RN: [birth year] (81 y.o. Valarie Cones, Mechele Claude Primary Care Provider: Leanna Battles Other Clinician: Referring Provider: Treating Provider/Extender: Kathlyn Sacramento in Treatment: 17 Debridement Performed for Assessment: Wound #1 Right,Medial Malleolus Performed By: Physician Fredirick Maudlin, MD Debridement Type: Debridement Level of Consciousness (Pre-procedure): Awake and Alert Pre-procedure Verification/Time Out Yes - 13:22 Taken: Start Time: 13:22 Pain Control: Lidocaine 5% topical ointment T Area Debrided (L x W): otal 1.4 (cm) x 1.7 (cm) = 2.38 (cm) Tissue and other material debrided: Non-Viable, Slough, Slough Level: Non-Viable Tissue Debridement Description: Selective/Open Wound Instrument: Curette Bleeding: Minimum Hemostasis Achieved: Pressure End Time: 13:34 Procedural Pain:  0 Post Procedural Pain: 0 Response to Treatment: Procedure was tolerated well Level of Consciousness (Post- Awake and Alert procedure): Post Debridement Measurements of Total Wound Length: (cm) 1.4 Stage: Category/Stage III Width: (cm) 1.7 Depth: (cm) 0.2 Volume: (cm) 0.374 Character of Wound/Ulcer Post Debridement: Improved Post Procedure Diagnosis Same as Pre-procedure Notes Scribed for Dr. Celine Ahr by J.Scotton Electronic Signature(s) Signed: 03/25/2022 3:36:47 PM By: Fredirick Maudlin MD FACS Signed: 03/25/2022 4:53:10 PM By: Dellie Catholic RN Markus Jarvis Yu ([id number]) [id number] Page 2 of 11 Entered By: Dellie Catholic on 03/25/2022 13:25:40 -------------------------------------------------------------------------------- Debridement Details Patient Name: Date of Service: Andrea Yu 03/25/2022 12:45 PM Medical Record Number: [id number] Patient Account Number: [id number] Date of Birth/Sex: Treating RN: 1940-10-21 (82 y.o. Andrea Yu Primary Care Provider: Leanna Battles Other Clinician: Referring Provider: Treating Provider/Extender: Kathlyn Sacramento in Treatment: 17 Debridement Performed for Assessment: Wound #4 Left,Lateral Ankle Performed By: Physician Fredirick Maudlin, MD Debridement Type: Debridement Level of Consciousness (Pre-procedure): Awake and Alert Pre-procedure Verification/Time Out Yes - 13:22 Taken: Start Time: 13:22 Pain Control: Lidocaine 5% topical ointment T Area Debrided (L x W): otal 0.9 (cm) x 0.8 (cm) = 0.72 (cm) Tissue and other material debrided: Non-Viable, Slough, Slough Level: Non-Viable Tissue Debridement Description: Selective/Open Wound Instrument: Curette Bleeding: Minimum Hemostasis Achieved: Pressure End Time: 13:34 Procedural Pain: 0 Post Procedural Pain: 0 Response to Treatment: Procedure was tolerated well Level of Consciousness (Post- Awake and  Alert procedure): Post Debridement Measurements of Total Wound Length: (cm) 0.9 Stage: Category/Stage II Width: (cm) 0.8 Depth: (cm) 0.2 Volume: (cm) 0.113 Character of Wound/Ulcer Post Debridement: Improved Post Procedure Diagnosis Same as Pre-procedure Notes Scribed for Dr. Celine Ahr by J.Scotton Electronic Signature(s) Signed: 03/25/2022 3:36:47 PM By: Fredirick Maudlin MD FACS Signed: 03/25/2022 4:53:10 PM By: Dellie Catholic RN Entered By: Dellie Catholic on 03/25/2022 13:26:33 -------------------------------------------------------------------------------- HPI Details Patient Name: Date of Service: Andrea Yu. 03/25/2022 12:45 PM Medical Record Number: [id number] Patient Account Number: [id number] Date of Birth/Sex: Treating RN: December 29, 1940 (  82 y.o. 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 She is not diabetic and quit smoking over 40 years ago. 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 She has developed Yu pressure ulcer on the right medial malleolus. In addition, she apparently has had multiple cutaneous abscesses on her buttocks that have required repeated incision and drainage procedures. She has developed Yu large abscess on her right buttock that has become painful. She was recently treated with cephalexin by her PCP for urinary tract infection and also received Yu shot of Rocephin for the abscess, but it has not yet been incised or drained. She is nonambulatory but is able to stand to transfer. She does not wear any sort of Prevalon boot. ABI in clinic today was 0.96. Andrea Yu, Andrea Yu ([id number])  [id number] Page 3 of 11 On her right medial malleolus, there is Yu circular ulcer. The surface is dry and fibrotic. There is some periwound erythema, but no malodor or purulent drainage. On her right buttock, there is Yu large fluctuant bulge about the size of an orange. It is also erythematous and tender. 12/04/2021: The culture from her abscess grew out staph. Yu 10-day course of Bactrim was prescribed and she is currently taking this. Unfortunately, Dakin's solution was not delivered and only 2 x 2 gauze was sent, rather than Kerlix so the packing of the wound has been rather suboptimal. The wound cavity has light slough over all of the surfaces. Although covered, bone is palpable. The medial ankle wound looks about the same with Yu layer of slough accumulation. In addition, her daughter peeled some dry skin off of her foot this morning and she now has denuded areas over the majority of the plantar surface of her right foot. 01/01/2022: The patient has missed several visits and to her daughters report that it is somewhat difficult to get her here on Yu weekly basis. In the interim, she has developed Yu new pressure ulcer on her left lateral malleolus. The plantar surface of her right foot has healed. The cavity on her right buttock has contracted, but it remains quite deep and the trochanter, while not exposed, is palpable at the base. The pressure ulcer on her right medial malleolus is basically unchanged. 02/09/2022: All wounds are little bit smaller today. As the buttock abscess cavity has contracted, it has left some tunneling that has not been adequately packed. Light slough accumulation on both ankle wounds. 02/26/2022: All of the wounds continue to contract. There is slough on both ankle wounds. The buttock ulcer is clean. She brought her wound VAC with her today. 03/12/2022: The ankle wounds are about the same this week with slough accumulation. The buttock ulcer is  smaller and quite clean. She decided that she could not tolerate the discomfort of the wound VAC and removed it. They have been packing the wound with Dakin's moistened gauze. 03/25/2022: The ankle wounds are slightly smaller. Both have slough accumulation. The orifice of the buttock ulcer has gotten quite Yu bit smaller than the underlying cavity. She has decided that she would like to have the wound VAC back. Electronic Signature(s) Signed: 03/25/2022 1:41:28 PM By: Fredirick Maudlin MD FACS Entered By: Fredirick Maudlin on 03/25/2022 13:41:28 --------------------------------------------------------------------------------  Physical Exam Details Patient Name: Date of Service: Andrea Yu 03/25/2022 12:45 PM Medical Record Number: [id number] Patient Account Number: [id number] Date of Birth/Sex: Treating RN: 23-Oct-1940 (82 y.o. F) Primary Care Provider: Leanna Battles Other Clinician: Referring Provider: Treating Provider/Extender: Kathlyn Sacramento in Treatment: 17 Constitutional Hypertensive, asymptomatic. . . . no acute distress. Respiratory Normal work of breathing on room air. Notes 03/25/2022: The ankle wounds are slightly smaller. Both have slough accumulation. The orifice of the buttock ulcer has gotten quite Yu bit smaller than the underlying cavity. Electronic Signature(s) Signed: 03/25/2022 1:44:14 PM By: Fredirick Maudlin MD FACS Entered By: Fredirick Maudlin on 03/25/2022 13:44:14 -------------------------------------------------------------------------------- Physician Orders Details Patient Name: Date of Service: Andrea Yu 03/25/2022 12:45 PM Medical Record Number: [id number] Patient Account Number: [id number] Date of Birth/Sex: Treating RN: [birth year] (81 y.o. 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 - Dr. Celine Ahr - Room 1+++ Extra Time++++ Return Yu Wound Vac Anesthetic (In clinic) Topical Lidocaine 4% applied to wound bed Bathing/ Shower/ Hygiene May shower and wash wound with soap and water. Off-Loading Multipodus Splint to: - prevalon boot to both feet Turn and reposition every 2 hours [place] wound care orders this week; continue Home Health for wound care. May utilize formulary equivalent dressing for wound treatment orders unless otherwise specified. - Changing Prisma to Iodosorb/ iodoflex to both ankle wounds. Cover with form border [facility] for this week, pack gluteus wound with Dakins wet to dry dressing Dressing changes to be completed by [place] on Monday / Wednesday / Friday except when patient has scheduled visit at [facility]. Other Home Health Orders/Instructions: - [facility] Wound Treatment Wound #1 - Malleolus Wound Laterality: Right, Medial Cleanser: Normal Saline (Generic) Every Other Day/30 Days Discharge Instructions: Cleanse the wound with Normal Saline prior to applying Yu clean dressing using gauze sponges, not tissue or cotton balls. Cleanser: Byram Ancillary Kit - 15 Day Supply (Generic) Every Other Day/30 Days Discharge Instructions: Use supplies as instructed; Kit contains: (15) Saline Bullets; (15) 3x3 Gauze; 15 pr Gloves Peri-Wound Care: Skin Prep (Generic) Every Other  Day/30 Days Discharge Instructions: Use skin prep as directed Prim Dressing: Hydrofera Blue Ready Transfer Foam, 2.5x2.5 (in/in) Every Other Day/30 Days ary Discharge Instructions: Apply directly to wound bed as directed Secondary Dressing: Zetuvit Plus Silicone Border Dressing 4x4 (in/in) (Generic) Every Other Day/30 Days Discharge Instructions: Apply silicone border over primary dressing as directed. Wound #2 - Gluteus Wound Laterality: Right Cleanser: Normal Saline 1 x Per Day/30 Days Discharge Instructions: Cleanse the wound with Normal Saline prior to applying Yu clean dressing using gauze sponges, not tissue or cotton balls. Peri-Wound Care: Skin Prep (Generic) 1 x Per Day/30 Days Discharge Instructions: Use skin prep as directed Wound #4 - Ankle Wound Laterality: Left, Lateral Cleanser: Normal Saline (Generic) Every Other Day/30 Days Discharge Instructions: Cleanse the wound with Normal Saline prior to applying Yu clean dressing using  gauze sponges, not tissue or cotton balls. Cleanser: Byram Ancillary Kit - 15 Day Supply (Generic) Every Other Day/30 Days Discharge Instructions: Use supplies as instructed; Kit contains: (15) Saline Bullets; (15) 3x3 Gauze; 15 pr Gloves Peri-Wound Care: Skin Prep (Generic) Every Other Day/30 Days Discharge Instructions: Use skin prep as directed Prim Dressing: Hydrofera Blue Ready Transfer Foam, 2.5x2.5 (in/in) Every Other Day/30 Days ary Discharge Instructions: Apply directly to wound bed as directed Secondary Dressing: Zetuvit Plus Silicone Border Dressing 4x4 (in/in) (Generic) Every Other Day/30 Days Discharge Instructions: Apply silicone border over primary dressing as directed. Electronic Signature(s) Signed: 03/25/2022 3:36:47 PM By: Fredirick Maudlin MD FACS Entered By: Fredirick Maudlin on 03/25/2022 13:44:40 Markus Jarvis Yu ([id number]) [id number] Page 5 of  11 -------------------------------------------------------------------------------- Problem List Details Patient Name: Date of Service: Andrea Yu 03/25/2022 12:45 PM Medical Record Number: [id number] Patient Account Number: [id number] Date of Birth/Sex: Treating RN: December 07, 1940 (82 y.o. 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 without 01/01/2022 No Yes evidence of necrosis L02.31 Cutaneous abscess of buttock 11/24/2021 No Yes E27.40 Unspecified adrenocortical insufficiency 11/24/2021 No Yes Z79.52 Long term (current) use of systemic steroids 11/24/2021 No Yes I10 Essential (primary) hypertension 11/24/2021 No Yes Inactive Problems Resolved Problems ICD-10 Code Description Active Date Resolved Date L97.511 Non-pressure chronic ulcer of other part of right foot limited to breakdown of skin 12/04/2021 12/04/2021 Electronic Signature(s) Signed: 03/25/2022 1:37:39 PM By: Fredirick Maudlin MD FACS Entered By: Fredirick Maudlin on 03/25/2022 13:37:39 -------------------------------------------------------------------------------- Progress Note Details Patient Name: Date of Service: Andrea Yu 03/25/2022 12:45 PM Medical Record Number: [id number] Patient Account Number: [id number] Date of Birth/Sex: Treating RN: July 04, 1940 (82 y.o. 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 She is not diabetic and quit smoking over 40 years ago. 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 She has developed Yu pressure ulcer on the right medial malleolus. In addition, she apparently has had multiple cutaneous abscesses on her buttocks that have required repeated incision and drainage procedures. She has developed Yu large abscess on her right buttock that has become painful. She was recently treated with cephalexin by her PCP for urinary tract infection and also received Yu shot of Rocephin for the abscess, but it has not yet been incised or drained. She is nonambulatory but is able to stand to transfer. She does not wear any sort of Prevalon boot. ABI in clinic today was 0.96. On her right medial malleolus, there is Yu circular ulcer. The surface is dry and fibrotic. There is some periwound erythema, but no malodor or purulent drainage. On her right buttock, there is Yu large fluctuant bulge about the size of  an orange. It is also erythematous and tender. 12/04/2021: The culture from her abscess grew out staph. Yu 10-day course of Bactrim was prescribed and she is currently taking this. Unfortunately, Dakin's solution was not delivered and only 2 x 2 gauze was sent, rather than Kerlix so the packing of the wound has been rather suboptimal. The wound cavity has light slough over all of the surfaces. Although covered, bone is palpable. The medial ankle wound looks about the same with Yu layer of slough accumulation. In addition, her daughter peeled some  dry skin off of her foot this morning and she now has denuded areas over the majority of the plantar surface of her right foot. 01/01/2022: The patient has missed several visits and to her daughters report that it is somewhat difficult to get her here on Yu weekly basis. In the interim, she has developed Yu new pressure ulcer on her left lateral malleolus. The plantar surface of her right foot has healed. The cavity on her right buttock has contracted, but it remains quite deep and the trochanter, while not exposed, is palpable at the base. The pressure ulcer on her right medial malleolus is basically unchanged. 02/09/2022: All wounds are little bit smaller today. As the buttock abscess cavity has contracted, it has left some tunneling that has not been adequately packed. Light slough accumulation on both ankle wounds. 02/26/2022: All of the wounds continue to contract. There is slough on both ankle wounds. The buttock ulcer is clean. She brought her wound VAC with her today. 03/12/2022: The ankle wounds are about the same this week with slough accumulation. The buttock ulcer is smaller and quite clean. She decided that she could not tolerate the discomfort of the wound VAC and removed it. They have been packing the wound with Dakin's moistened gauze. 03/25/2022: The ankle wounds are slightly smaller. Both have slough accumulation. The orifice of the buttock ulcer has gotten quite Yu bit smaller than the underlying cavity. She has decided that she would like to have the wound VAC back. Patient History Information obtained from Patient, Caregiver, Chart. Family History Cancer - Father,Maternal Grandparents,Siblings,Child, Heart Disease - Father, Hypertension - Father, Thyroid Problems - Mother, No family history of Diabetes, Hereditary Spherocytosis, Kidney Disease, Lung Disease, Seizures, Stroke, Tuberculosis. Social History Former smoker - quit 23 yr ago, Marital Status - Widowed, Alcohol Use - Never,  Drug Use - No History, Caffeine Use - Daily - tea, soda. Medical History Eyes Patient has history of Cataracts - bil extracted Denies history of Glaucoma, Optic Neuritis Cardiovascular Patient has history of Arrhythmia - afib, Hypertension Endocrine Denies history of Type I Diabetes, Type II Diabetes Genitourinary Denies history of End Stage Renal Disease Integumentary (Skin) Denies history of History of Burn Musculoskeletal Patient has history of Osteoarthritis, Osteomyelitis - pelvic Neurologic Patient has history of Neuropathy Oncologic Denies history of Received Chemotherapy, Received Radiation Psychiatric Patient has history of Confinement Anxiety Denies history of Anorexia/bulimia Hospitalization/Surgery History - TEE. - right ankle fusion. - bil knee replacements. - bil cataract extractions. - posterior lumbar fusion. - vaginal hysterectomy. - cholecystectomy. 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 no acute distress. Vitals Time Taken: 12:57 PM, Height: 59 in, Weight: 155 lbs, BMI: 31.3, Temperature: 97.9 F, Pulse: 80 bpm,  Respiratory Rate: 18 breaths/min, Blood Pressure: 160/83 mmHg. Respiratory Normal work of breathing on room air. General Notes: 03/25/2022: The ankle wounds are slightly smaller. Both have slough accumulation. The orifice of the buttock ulcer has gotten quite Yu bit smaller than the underlying cavity. Integumentary (Hair, Skin) Wound #1 status is Open. Original cause of wound was Pressure Injury. The date acquired was: 11/16/2021. The wound has been in treatment 17  weeks. The wound is located on the Right,Medial Malleolus. The wound measures 1.4cm length x 1.7cm width x 0.2cm depth; 1.869cm^2 area and 0.374cm^3 volume. There is Fat Layer (Subcutaneous Tissue) exposed. There is no tunneling or undermining noted. There is Yu medium amount of serosanguineous drainage noted. The wound margin is distinct with the outline attached to the wound base. There is small (1-33%) red granulation within the wound bed. There is Yu large (67-100%) amount of necrotic tissue within the wound bed including Adherent Slough. The periwound skin appearance had no abnormalities noted for texture. The periwound skin appearance had no abnormalities noted for moisture. The periwound skin appearance exhibited: Erythema. The surrounding wound skin color is noted with erythema. Erythema is measured at 1 cm. Periwound temperature was noted as No Abnormality. Wound #2 status is Open. Original cause of wound was Bump. The date acquired was: 09/28/2021. The wound has been in treatment 17 weeks. The wound is located on the Right Gluteus. The wound measures 1cm length x 0.5cm width x 3.2cm depth; 0.393cm^2 area and 1.257cm^3 volume. There is bone and Fat Layer (Subcutaneous Tissue) exposed. There is no undermining noted, however, there is tunneling at 1:00 with Yu maximum distance of 4.2cm. There is additional tunneling and at 2:00 with Yu maximum distance of 3.5cm. There is Yu large amount of serosanguineous drainage noted. The wound margin is well defined and not attached to the wound base. There is large (67-100%) pink granulation within the wound bed. There is Yu small (1-33%) amount of necrotic tissue within the wound bed including Adherent Slough. The periwound skin appearance had no abnormalities noted for texture. The periwound skin appearance had no abnormalities noted for moisture. The periwound skin appearance had no abnormalities noted for color. Periwound temperature was noted as No  Abnormality. The periwound has tenderness on palpation. Wound #4 status is Open. Original cause of wound was Pressure Injury. The date acquired was: 12/23/2021. The wound has been in treatment 11 weeks. The wound is located on the Left,Lateral Ankle. The wound measures 0.9cm length x 0.8cm width x 0.2cm depth; 0.565cm^2 area and 0.113cm^3 volume. There is Fat Layer (Subcutaneous Tissue) exposed. There is no tunneling noted, however, there is undermining starting at 7:00 and ending at 12:00 with Yu maximum distance of 0.2cm. There is Yu medium amount of serosanguineous drainage noted. The wound margin is distinct with the outline attached to the wound base. There is medium (34-66%) pink granulation within the wound bed. There is Yu medium (34-66%) amount of necrotic tissue within the wound bed including Adherent Slough. The periwound skin appearance had no abnormalities noted for texture. The periwound skin appearance had no abnormalities noted for moisture. The periwound skin appearance exhibited: Rubor. Periwound temperature was noted as No Abnormality. 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 There was Yu Selective/Open Wound Non-Viable Tissue Debridement with Yu total area of 2.38 sq cm performed by Fredirick Maudlin, MD. With the following instrument(s): Curette to remove Non-Viable tissue/material. Material removed includes [facility] after achieving pain control using Lidocaine 5% topical ointment. No specimens were taken. Yu time out was conducted at 13:22, prior to the start of  the procedure. Yu Minimum amount of bleeding was controlled with Pressure. The procedure was tolerated well with Yu pain level of 0 throughout and Yu pain level of 0 following the procedure. Post Debridement Measurements: 1.4cm length x 1.7cm width x 0.2cm depth; 0.374cm^3 volume. Post debridement Stage noted as Category/Stage III. Character of Wound/Ulcer Post Debridement is improved. Post procedure Diagnosis Wound #1: Same as Pre-Procedure General Notes: Scribed for Dr. Celine Ahr by J.Scotton. 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 There was Yu Selective/Open Wound Non-Viable Tissue Debridement with Yu total area of 0.72 sq cm performed by Fredirick Maudlin, MD. With the following instrument(s): Curette to remove Non-Viable tissue/material. Material removed includes [facility] after achieving pain control using Lidocaine 5% topical ointment. No specimens were taken. Yu time out was conducted at 13:22, prior to the start of the procedure. Yu Minimum amount of bleeding was controlled with Pressure. The procedure was tolerated well with Yu MELICIA, CATON Yu ([id number]) [id number] Page 8 of 11 pain level of 0 throughout and Yu pain level of 0 following the procedure. Post Debridement Measurements: 0.9cm length x 0.8cm width x 0.2cm depth; 0.113cm^3 volume. Post debridement Stage noted as Category/Stage II. Character of Wound/Ulcer Post Debridement is improved. Post procedure Diagnosis Wound #4: Same as Pre-Procedure General Notes: Scribed for Dr. Celine Ahr by J.Scotton. Plan Follow-up Appointments: Return Appointment in 2 weeks. - Dr. Celine Ahr - Room 1+++ Extra Time++++ Wound Vac Anesthetic: (In clinic) Topical Lidocaine 4% applied to wound bed Bathing/ Shower/ Hygiene: May shower and wash wound with soap and water. Off-Loading: Multipodus Splint to: - prevalon boot to both feet Turn and reposition every 2 hours Home  Health: New wound care orders this week; continue Home Health for wound care. May utilize formulary equivalent dressing for wound treatment orders unless otherwise specified. - Changing Prisma to Iodosorb/ iodoflex to both ankle wounds. Cover with form border [facility] for this week, pack gluteus wound with Dakins wet to dry dressing Dressing changes to be completed by [place] on Monday / Wednesday / Friday except when patient has scheduled visit at [facility]. Other Home Health Orders/Instructions: - [facility] WOUND #1: - Malleolus Wound Laterality: Right, Medial Cleanser: Normal Saline (Generic) Every Other Day/30 Days Discharge Instructions: Cleanse the wound with Normal Saline prior to applying Yu clean dressing using gauze sponges, not tissue or cotton balls. Cleanser: Byram Ancillary Kit - 15 Day Supply (Generic) Every Other Day/30 Days Discharge Instructions: Use supplies as instructed; Kit contains: (15) Saline Bullets; (15) 3x3 Gauze; 15 pr Gloves Peri-Wound Care: Skin Prep (Generic) Every Other Day/30 Days Discharge Instructions: Use skin prep as directed Prim Dressing: Hydrofera Blue Ready Transfer Foam, 2.5x2.5 (in/in) Every Other Day/30 Days ary Discharge Instructions: Apply directly to wound bed as directed Secondary Dressing: Zetuvit Plus Silicone Border Dressing 4x4 (in/in) (Generic) Every Other Day/30 Days Discharge Instructions: Apply silicone border over primary dressing as directed. WOUND #2: - Gluteus Wound Laterality: Right Cleanser: Normal Saline 1 x Per Day/30 Days Discharge Instructions: Cleanse the wound with Normal  Saline prior to applying Yu clean dressing using gauze sponges, not tissue or cotton balls. Peri-Wound Care: Skin Prep (Generic) 1 x Per Day/30 Days Discharge Instructions: Use skin prep as directed WOUND #4: - Ankle Wound Laterality: Left, Lateral Cleanser: Normal Saline (Generic) Every Other Day/30 Days Discharge Instructions: Cleanse the wound  with Normal Saline prior to applying Yu clean dressing using gauze sponges, not tissue or cotton balls. Cleanser: Byram Ancillary Kit - 15 Day Supply (Generic) Every Other Day/30 Days Discharge Instructions: Use supplies as instructed; Kit contains: (15) Saline Bullets; (15) 3x3 Gauze; 15 pr Gloves Peri-Wound Care: Skin Prep (Generic) Every Other Day/30 Days Discharge Instructions: Use skin prep as directed Prim Dressing: Hydrofera Blue Ready Transfer Foam, 2.5x2.5 (in/in) Every Other Day/30 Days ary Discharge Instructions: Apply directly to wound bed as directed Secondary Dressing: Zetuvit Plus Silicone Border Dressing 4x4 (in/in) (Generic) Every Other Day/30 Days Discharge Instructions: Apply silicone border over primary dressing as directed. 03/25/2022: The ankle wounds are slightly smaller. Both have slough accumulation. The orifice of the buttock ulcer has gotten quite Yu bit smaller than the underlying cavity. I used Yu curette to debride slough off of both ankle wounds. We are going to use [facility] on both of these sites and she should continue to wear her Prevalon boots at all times. Because of concern that the wound VAC sponge would not be able to be easily placed into the buttock ulcer due to the small size of the orifice, I elected to enlarge the opening. I used 1% lidocaine with epinephrine to numb the tissues surrounding the orifice and made Yu linear incision to extend the length of the wound such that the entire cavity was accessible. Her wound VAC was then applied by the nursing staff. She will follow-up in 2 weeks. Electronic Signature(s) Signed: 03/25/2022 1:46:17 PM By: Fredirick Maudlin MD FACS Entered By: Fredirick Maudlin on 03/25/2022 13:46:17 -------------------------------------------------------------------------------- HxROS Details Patient Name: Date of Service: Andrea Yu 03/25/2022 12:45 PM Medical Record Number: [id number] Patient Account Number:  [id number] Date of Birth/Sex: Treating RN: 07-Nov-1940 (81 y.o. F) Primary Care Provider: Leanna Battles Other Clinician: Referring Provider: Treating Provider/Extender: Kathlyn Sacramento in Treatment: [id number] Yu ([id number]) [id number] Page 9 of 11 Information Obtained From Patient Caregiver Chart Constitutional Symptoms (General Health) Medical History: Past Medical History Notes: morbid obesity Eyes Medical History: Positive for: Cataracts - bil extracted Negative for: Glaucoma; Optic Neuritis Ear/Nose/Mouth/Throat Medical History: Past Medical History Notes: hard of hearing Cardiovascular Medical History: Positive for: Arrhythmia - afib; Hypertension Gastrointestinal Medical History: Past Medical History Notes: GERD Endocrine Medical History: Negative for: Type I Diabetes; Type II Diabetes Past Medical History Notes: adrenal insufficiency Genitourinary Medical History: Negative for: End Stage Renal Disease Integumentary (Skin) Medical History: Negative for: History of Burn Musculoskeletal Medical History: Positive for: Osteoarthritis; Osteomyelitis - pelvic Past Medical History Notes: psoriatic arthritis, thoracic discitis, displaced spiral fx right tibia Neurologic Medical History: Positive for: Neuropathy Past Medical History Notes: hx [facility] Oncologic Medical History: Negative for: Received Chemotherapy; Received Radiation Psychiatric Medical History: Positive for: Confinement Anxiety Negative for: Anorexia/bulimia HBO Extended History Items Eyes: Cataracts Immunizations ANAIZ, DREILING Yu ([id number]) [id number] Page 10 of 11 Pneumococcal Vaccine: Received Pneumococcal Vaccination: Yes Received Pneumococcal Vaccination On or After 60th Birthday: Yes Implantable Devices None Hospitalization / Surgery History Type of Hospitalization/Surgery TEE right ankle  fusion bil knee replacements bil cataract extractions posterior lumbar fusion vaginal hysterectomy cholecystectomy Family and Social History Cancer: Yes -  Father,Maternal Grandparents,Siblings,Child; Diabetes: No; Heart Disease: Yes - Father; Hereditary Spherocytosis: No; Hypertension: Yes - Father; Kidney Disease: No; Lung Disease: No; Seizures: No; Stroke: No; Thyroid Problems: Yes - Mother; Tuberculosis: No; Former smoker - quit 39 yr ago; Marital Status - Widowed; Alcohol Use: Never; Drug Use: No History; Caffeine Use: Daily - tea, soda; Financial Concerns: No; Food, Clothing or Shelter Needs: No; Support System Lacking: No; Transportation Concerns: No Electronic Signature(s) Signed: 03/25/2022 3:36:47 PM By: Fredirick Maudlin MD FACS Entered By: Fredirick Maudlin on 03/25/2022 13:43:52 -------------------------------------------------------------------------------- SuperBill Details Patient Name: Date of Service: Andrea Yu 03/25/2022 Medical Record Number: [id number] Patient Account Number: [id number] Date of Birth/Sex: Treating RN: 06-25-1940 (81 y.o. 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 L02.31 Cutaneous abscess of buttock E27.40 Unspecified adrenocortical insufficiency Z79.52 Long term (current) use of systemic steroids I10 Essential (primary) hypertension Facility Procedures : CPT4 Code: [id number] Description: [id number] - DEBRIDE WOUND 1ST 20 SQ CM OR < ICD-10 Diagnosis Description 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, stage 3 L89.522 Pressure ulcer of left ankle, stage 2 Modifier: Quantity: 1 Physician  Procedures : CPT4 Code Description Modifier [id number] - WC PHYS LEVEL 4 - EST PT 25 ICD-10 Diagnosis Description 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, stage 3 L89.522 Pressure ulcer of left ankle, stage 2 L98.415 Non-pressure chronic ulcer of buttock with  muscle involvement without evidence of necrosis Z79.52 Long term (current) use of systemic steroids Quantity: 1 : [id number] - WC PHYS DEBR WO ANESTH 20 SQ CM ICD-10 Diagnosis Description ASHLIEGH, FACEY Yu ([id number]) [id number] 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, stage 3 L89.522 Pressure ulcer of left ankle, stage 2 Quantity: 1 Page 11 of 11 Electronic Signature(s) Signed: 03/25/2022 1:46:52 PM By: Fredirick Maudlin MD FACS Entered By: Fredirick Maudlin on 03/25/2022 13:46:52

## 2022-03-28 NOTE — Progress Notes (Signed)
Andrea Yu, Andrea Yu ([id number]) [id number] Page 1 of 10 Visit Report for 03/25/2022 Arrival Information Details Patient Name: Date of Service: Andrea Yu 03/25/2022 12:45 PM Medical Record Number: [id number] Patient Account Number: [id number] Date of Birth/Sex: Treating RN: [birth year] (82 y.o. Andrea Yu Primary Care Kaidence Sant: Leanna Battles Other Clinician: Referring Retal Tonkinson: Treating Ansley Stanwood/Extender: Kathlyn Sacramento in Treatment: 54 Visit Information History Since Last Visit Added or deleted any medications: No Patient Arrived: Wheel Chair Any new allergies or adverse reactions: No Arrival Time: 12:55 Had Yu fall or experienced change in No Accompanied By: the group-caregiver and activities of daily living that may affect daughter risk of falls: Transfer Assistance: None Signs or symptoms of abuse/neglect since last visito No Patient Identification Verified: Yes Hospitalized since last visit: No Patient Requires Transmission-Based No Implantable device outside of the clinic excluding No Precautions: cellular tissue based products placed in the center Patient Has Alerts: No since last visit: Has Dressing in Place as Prescribed: Yes Pain Present Now: No Electronic Signature(s) Signed: 03/25/2022 4:53:10 PM By: Dellie Catholic RN Entered By: Dellie Catholic on 03/25/2022 12:57:02 -------------------------------------------------------------------------------- Encounter Discharge Information Details Patient Name: Date of Service: Andrea Yu 03/25/2022 12:45 PM Medical Record Number: [id number] Patient Account Number: [id number] Date of Birth/Sex: Treating RN: 12-28-1940 (82 y.o. Andrea Yu Primary Care Justinn Welter: Leanna Battles Other Clinician: Referring Renezmae Canlas: Treating Cesia Orf/Extender: Kathlyn Sacramento in Treatment: 17 Encounter Discharge Information Items Post  Procedure Vitals Discharge Condition: Stable Temperature (F): 97.9 Ambulatory Status: Wheelchair Pulse (bpm): 80 Discharge Destination: Home Respiratory Rate (breaths/min): 18 Transportation: Private Auto Blood Pressure (mmHg): 160/83 Accompanied By: Daughter and caregiver Schedule Follow-up Appointment: Yes Clinical Summary of Care: Patient Declined Electronic Signature(s) Signed: 03/25/2022 4:53:10 PM By: Dellie Catholic RN Entered By: Dellie Catholic on 03/25/2022 16:42:45 -------------------------------------------------------------------------------- Lower Extremity Assessment Details Patient Name: Date of Service: Andrea Yu 03/25/2022 12:45 PM Medical Record Number: [id number] Patient Account Number: [id number] Date of Birth/Sex: Treating RN: [birth year] (82 y.o. Andrea Yu Primary Care Teneshia Hedeen: Leanna Battles Other Clinician: Referring Darvell Monteforte: Treating Herschell Virani/Extender: Kathlyn Sacramento in Treatment: 17 Edema Assessment Assessed: Shirlyn Goltz: No] Patrice Paradise: No] T[LeftSANDIA, GWINN [id number] [Right: [id number] Page 2 of 10] Edema: [Left: N] [Right: o] Calf Left: Right: Point of Measurement: From Medial Instep 31.5 cm 32 cm Ankle Left: Right: Point of Measurement: From Medial Instep 19.4 cm 20.4 cm Vascular Assessment Pulses: Dorsalis Pedis Palpable: [Left:Yes] [Right:Yes] Electronic Signature(s) Signed: 03/25/2022 4:53:10 PM By: Dellie Catholic RN Entered By: Dellie Catholic on 03/25/2022 12:58:05 -------------------------------------------------------------------------------- Multi Wound Chart Details Patient Name: Date of Service: Andrea Yu 03/25/2022 12:45 PM Medical Record Number: [id number] Patient Account Number: [id number] Date of Birth/Sex: Treating RN: Feb 06, 1940 (81 y.o. F) Primary Care Ileanna Gemmill: Leanna Battles Other Clinician: Referring Shakema Surita: Treating Andrea Yu/Extender:  Kathlyn Sacramento in Treatment: 17 Vital Signs Height(in): 43 Pulse(bpm): 48 Weight(lbs): 155 Blood Pressure(mmHg): 160/83 Body Mass Index(BMI): 31.3 Temperature(F): 97.9 Respiratory Rate(breaths/min): 18 [1:Photos:] Right, Medial Malleolus Right Gluteus Left, Lateral Ankle Wound Location: Pressure Injury Bump Pressure Injury Wounding Event: Pressure Ulcer Abscess Pressure Ulcer Primary Etiology: Cataracts, Arrhythmia, Hypertension, Cataracts, Arrhythmia, Hypertension, Cataracts, Arrhythmia, Hypertension, Comorbid History: Osteoarthritis, Osteomyelitis, Osteoarthritis, Osteomyelitis, Osteoarthritis, Osteomyelitis, Neuropathy, Confinement Anxiety Neuropathy, Confinement Anxiety Neuropathy, Confinement Anxiety 11/16/2021 09/28/2021 12/23/2021 Date Acquired: '17 17 11 '$ Weeks of Treatment: Open Open Open Wound Status: No No No Wound Recurrence: 1.4x1.7x0.2 1x0.5x3.2 0.9x0.8x0.2 Measurements L x W  x D (cm) 1.869 0.393 0.565 Yu (cm) : rea 0.374 1.257 0.113 Volume (cm) : -21.40% 96.20% -300.70% % Reduction in Yu rea: -142.90% 96.00% -707.10% % Reduction in Volume: 1 Position 1 (o'clock): 4.2 Maximum Distance 1 (cm): 2 Position 2 (o'clock): 3.5 Maximum Distance 2 (cm): 7 Starting Position 1 (o'clock): 12 Ending Position 1 (o'clock): 0.2 Maximum Distance 1 (cm): No Yes No Tunneling: No No Yes Undermining: Category/Stage III Full Thickness With Exposed Support Category/Stage II ClassificationSATVIKA, THIELEN Yu ([id number]) [phone].pdf Page 3 of 10 Structures Medium Large Medium Exudate Amount: Serosanguineous Serosanguineous Serosanguineous Exudate Type: red, Yu red, Yu red, Yu Exudate Color: Distinct, outline attached Well defined, not attached Distinct, outline attached Wound Margin: Small (1-33%) Large (67-100%) Medium (34-66%) Granulation Amount: Red Pink Pink Granulation Quality: Large (67-100%)  Small (1-33%) Medium (34-66%) Necrotic Amount: Fat Layer (Subcutaneous Tissue): Yes Fat Layer (Subcutaneous Tissue): Yes Fat Layer (Subcutaneous Tissue): Yes Exposed Structures: Fascia: No Bone: Yes Fascia: No Tendon: No Fascia: No Tendon: No Muscle: No Tendon: No Muscle: No Joint: No Muscle: No Joint: No Bone: No Joint: No Bone: No None None Small (1-33%) Epithelialization: Debridement - Selective/Open Wound N/Yu Debridement - Selective/Open Wound Debridement: Pre-procedure Verification/Time Out 13:22 N/Yu 13:22 Taken: Lidocaine 5% topical ointment N/Yu Lidocaine 5% topical ointment Pain Control: Jennings Tissue Debrided: Non-Viable Tissue N/Yu Non-Viable Tissue Level: 2.38 N/Yu 0.72 Debridement Yu (sq cm): rea Curette N/Yu Curette Instrument: Minimum N/Yu Minimum Bleeding: Pressure N/Yu Pressure Hemostasis Yu chieved: 0 N/Yu 0 Procedural Pain: 0 N/Yu 0 Post Procedural Pain: Procedure was tolerated well N/Yu Procedure was tolerated well Debridement Treatment Response: 1.4x1.7x0.2 N/Yu 0.9x0.8x0.2 Post Debridement Measurements L x W x D (cm) 0.374 N/Yu 0.113 Post Debridement Volume: (cm) Category/Stage III N/Yu Category/Stage II Post Debridement Stage: No Abnormalities Noted No Abnormalities Noted No Abnormalities Noted Periwound Skin Texture: No Abnormalities Noted No Abnormalities Noted No Abnormalities Noted Periwound Skin Moisture: Erythema: Yes Erythema: Yes Rubor: Yes Periwound Skin Color: Measured: 1cm N/Yu N/Yu Erythema Measurement: No Abnormality No Abnormality No Abnormality Temperature: N/Yu Yes N/Yu Tenderness on Palpation: Debridement N/Yu Debridement Procedures Performed: Treatment Notes Electronic Signature(s) Signed: 03/25/2022 1:37:49 PM By: Fredirick Maudlin MD FACS Entered By: Fredirick Maudlin on 03/25/2022 13:37:49 -------------------------------------------------------------------------------- Multi-Disciplinary Care Plan  Details Patient Name: Date of Service: Andrea Yu. 03/25/2022 12:45 PM Medical Record Number: [id number] Patient Account Number: [id number] Date of Birth/Sex: Treating RN: May 22, 1940 (81 y.o. Andrea Yu Primary Care Keaunna Skipper: Leanna Battles Other Clinician: Referring Keri Tavella: Treating Palmyra Rogacki/Extender: Kathlyn [place] in Treatment: [place] reviewed with physician Active Inactive Pressure Nursing Diagnoses: Knowledge deficit related to causes and risk factors for pressure ulcer development Knowledge deficit related to management of pressures ulcers Potential for impaired tissue integrity related to pressure, friction, moisture, and shear Goals: Patient/caregiver will verbalize understanding of pressure ulcer management Date Initiated: 11/24/2021 Target Resolution Date: 05/25/2022 Goal Status: Active Interventions: Assess: immobility, friction, shearing, incontinence upon admission and as needed Assess offloading mechanisms upon admission and as needed Andrea Yu, Andrea Yu ([id number]) [phone].pdf Page 4 of 10 Assess potential for pressure ulcer upon admission and as needed Provide education on pressure ulcers Treatment Activities: Pressure reduction/relief device ordered : 11/24/2021 Notes: Soft Tissue Infection Nursing Diagnoses: Impaired tissue integrity Potential for infection: soft tissue Goals: Patient's soft tissue infection will resolve Date Initiated: 11/24/2021 Target Resolution Date: 05/25/2022 Goal Status: Active Signs and symptoms of infection will be recognized early to allow for prompt treatment Date  Initiated: 11/24/2021 Date Inactivated: 01/01/2022 Target Resolution Date: 12/22/2021 Goal Status: Met Interventions: Assess signs and symptoms of infection every visit Provide education on infection Treatment Activities: Culture : 11/24/2021 Culture and sensitivity :  11/24/2021 Education provided on Infection : 12/04/2021 Systemic antibiotics : 11/24/2021 Notes: Wound/Skin Impairment Nursing Diagnoses: Impaired tissue integrity Knowledge deficit related to ulceration/compromised skin integrity Goals: Patient/caregiver will verbalize understanding of skin care regimen Date Initiated: 11/24/2021 Target Resolution Date: 05/25/2022 Goal Status: Active Ulcer/skin breakdown will have Yu volume reduction of 30% by week 4 Date Initiated: 11/24/2021 Target Resolution Date: 05/25/2022 Goal Status: Active Interventions: Assess patient/caregiver ability to obtain necessary supplies Assess patient/caregiver ability to perform ulcer/skin care regimen upon admission and as needed Assess ulceration(s) every visit Provide education on ulcer and skin care Treatment Activities: Skin care regimen initiated : 11/24/2021 Topical wound management initiated : 11/24/2021 Notes: Electronic Signature(s) Signed: 03/25/2022 4:53:10 PM By: Dellie Catholic RN Entered By: Dellie Catholic on 03/25/2022 16:40:13 -------------------------------------------------------------------------------- Pain Assessment Details Patient Name: Date of Service: Andrea Yu 03/25/2022 12:45 PM Medical Record Number: [id number] Patient Account Number: [id number] Date of Birth/Sex: Treating RN: 1940/12/22 (82 y.o. Andrea Yu Primary Care Xzayvier Fagin: Leanna Battles Other Clinician: Referring Dannah Ryles: Treating Avrohom Mckelvin/Extender: Kathlyn Sacramento in Treatment: [address] [place] ([id number]) [id number] Page 5 of 10 Active Problems Location of Pain Severity and Description of Pain Patient Has Paino No Site Locations Pain Management and Medication Current Pain Management: Electronic Signature(s) Signed: 03/25/2022 4:53:10 PM By: Dellie Catholic RN Entered By: Dellie Catholic on 03/25/2022  12:57:57 -------------------------------------------------------------------------------- Patient/Caregiver Education Details Patient Name: Date of Service: Andrea Yu 2/29/2024andnbsp12:45 PM Medical Record Number: [id number] Patient Account Number: [id number] Date of Birth/Gender: Treating RN: 1940/11/05 (82 y.o. Andrea Yu Primary Care Physician: Leanna Battles Other Clinician: Referring Physician: Treating Physician/Extender: Kathlyn Sacramento in Treatment: 17 Education Assessment Education Provided To: Patient Education Topics Provided Wound/Skin Impairment: Methods: Explain/Verbal Responses: Return demonstration correctly Electronic Signature(s) Signed: 03/25/2022 4:53:10 PM By: Dellie Catholic RN Entered By: Dellie Catholic on 03/25/2022 16:40:29 -------------------------------------------------------------------------------- Wound Assessment Details Patient Name: Date of Service: Andrea Yu 03/25/2022 12:45 PM Medical Record Number: [id number] Patient Account Number: [id number] Date of Birth/Sex: Treating RN: 01-04-1941 (82 y.o. Andrea Yu Primary Care Quintyn Dombek: Leanna Battles Other Clinician: Referring Toma Arts: Treating Jasiya Markie/Extender: Kathlyn [place] in Treatment: [address] Wound Status Andrea Yu, Andrea Yu ([id number]) [id number] Page 6 of 10 Wound Number: 1 Primary Pressure Ulcer Etiology: Wound Location: Right, Medial Malleolus Wound Open Wounding Event: Pressure Injury Status: Date Acquired: 11/16/2021 Comorbid Cataracts, Arrhythmia, Hypertension, Osteoarthritis, Weeks Of Treatment: 17 History: Osteomyelitis, Neuropathy, Confinement Anxiety Clustered Wound: No Photos Wound Measurements Length: (cm) 1.4 Width: (cm) 1.7 Depth: (cm) 0.2 Area: (cm) 1.869 Volume: (cm) 0.374 % Reduction in Area: -21.4% % Reduction in Volume: -142.9% Epithelialization:  None Tunneling: No Undermining: No Wound Description Classification: Category/Stage III Wound Margin: Distinct, outline attached Exudate Amount: Medium Exudate Type: Serosanguineous Exudate Color: red, Yu Foul Odor After Cleansing: No Slough/Fibrino Yes Wound Bed Granulation Amount: Small (1-33%) Exposed Structure Granulation Quality: Red Fascia Exposed: No Necrotic Amount: Large (67-100%) Fat Layer (Subcutaneous Tissue) Exposed: Yes Necrotic Quality: Adherent Slough Tendon Exposed: No Muscle Exposed: No Joint Exposed: No Bone Exposed: No Periwound Skin Texture Texture Color No Abnormalities Noted: Yes No Abnormalities Noted: No Erythema: Yes Moisture Erythema Measurement: Measured No Abnormalities Noted: Yes 1 cm Temperature / Pain Temperature: No Abnormality Treatment Notes Wound #  1 (Malleolus) Wound Laterality: Right, Medial Cleanser Normal Saline Discharge Instruction: Cleanse the wound with Normal Saline prior to applying Yu clean dressing using gauze sponges, not tissue or cotton balls. Byram Ancillary Kit - 15 Day Supply Discharge Instruction: Use supplies as instructed; Kit contains: (15) Saline Bullets; (15) 3x3 Gauze; 15 pr Gloves Peri-Wound Care Skin Prep Discharge Instruction: Use skin prep as directed Topical Primary Dressing Hydrofera Blue Ready Transfer Foam, 2.5x2.5 (in/in) Discharge Instruction: Apply directly to wound bed as directed Andrea Yu, Andrea Yu ([id number]) [phone].pdf Page 7 of 10 Secondary Dressing Zetuvit Plus Silicone Border Dressing 4x4 (in/in) Discharge Instruction: Apply silicone border over primary dressing as directed. Secured With Compression Wrap Compression Stockings Environmental education officer) Signed: 03/25/2022 4:27:41 PM By: Blanche East RN Signed: 03/25/2022 4:53:10 PM By: Dellie Catholic RN Entered By: Blanche East on 03/25/2022  13:11:27 -------------------------------------------------------------------------------- Wound Assessment Details Patient Name: Date of Service: Andrea Yu 03/25/2022 12:45 PM Medical Record Number: [id number] Patient Account Number: [id number] Date of Birth/Sex: Treating RN: 04/21/1940 (82 y.o. Andrea Yu Primary Care Kiri Hinderliter: Leanna Battles Other Clinician: Referring Marquite Attwood: Treating Jemario Poitras/Extender: Kathlyn Sacramento in Treatment: 17 Wound Status Wound Number: 2 Primary Abscess Etiology: Wound Location: Right Gluteus Wound Open Wounding Event: Bump Status: Date Acquired: 09/28/2021 Comorbid Cataracts, Arrhythmia, Hypertension, Osteoarthritis, Weeks Of Treatment: 17 History: Osteomyelitis, Neuropathy, Confinement Anxiety Clustered Wound: No Photos Wound Measurements Length: (cm) 1 Width: (cm) 0.5 Depth: (cm) 3.2 Area: (cm) 0.393 Volume: (cm) 1.257 % Reduction in Area: 96.2% % Reduction in Volume: 96% Epithelialization: None Tunneling: Yes Location 1 Position (o'clock): 1 Maximum Distance: (cm) 4.2 Location 2 Position (o'clock): 2 Maximum Distance: (cm) 3.5 Undermining: No Wound Description Classification: Full Thickness With Exposed Support Structures Wound Margin: Well defined, not attached Exudate Amount: Large Exudate Type: Serosanguineous Exudate Color: red, Yu Foul Odor After Cleansing: No Slough/Fibrino Yes Wound Bed Granulation Amount: Large (67-100%) Exposed Structure Granulation Quality: Pink Fascia Exposed: No Andrea Yu, Andrea Yu ([id number]) [phone].pdf Page 8 of 10 Necrotic Amount: Small (1-33%) Fat Layer (Subcutaneous Tissue) Exposed: Yes Necrotic Quality: Adherent Slough Tendon Exposed: No Muscle Exposed: No Joint Exposed: No Bone Exposed: Yes Periwound Skin Texture Texture Color No Abnormalities Noted: Yes No Abnormalities Noted: Yes Moisture Temperature / Pain No  Abnormalities Noted: Yes Temperature: No Abnormality Tenderness on Palpation: Yes Treatment Notes Wound #2 (Gluteus) Wound Laterality: Right Cleanser Normal Saline Discharge Instruction: Cleanse the wound with Normal Saline prior to applying Yu clean dressing using gauze sponges, not tissue or cotton balls. Peri-Wound Care Skin Prep Discharge Instruction: Use skin prep as directed Topical Primary Dressing Secondary Dressing Secured With Compression Wrap Compression Stockings Add-Ons Electronic Signature(s) Signed: 03/25/2022 4:27:41 PM By: Blanche East RN Signed: 03/25/2022 4:53:10 PM By: Dellie Catholic RN Entered By: Blanche East on 03/25/2022 13:10:34 -------------------------------------------------------------------------------- Wound Assessment Details Patient Name: Date of Service: Andrea Yu 03/25/2022 12:45 PM Medical Record Number: [id number] Patient Account Number: [id number] Date of Birth/Sex: Treating RN: Apr 09, 1940 (82 y.o. Andrea Yu Primary Care Candy Leverett: Leanna Battles Other Clinician: Referring Cheyene Hamric: Treating Shlomo Seres/Extender: Kathlyn [place] in Treatment: 17 Wound Status Wound Number: 4 Primary Pressure Ulcer Etiology: Wound Location: Left, Lateral Ankle Wound Open Wounding Event: Pressure Injury Status: Date Acquired: 12/23/2021 Comorbid Cataracts, Arrhythmia, Hypertension, Osteoarthritis, Weeks Of Treatment: 11 History: Osteomyelitis, Neuropathy, Confinement Anxiety Clustered Wound: No Photos Andrea Yu, Andrea Yu ([id number]) [phone].pdf Page 9 of 10 Wound Measurements Length: (cm) 0.9 Width: (cm) 0.8 Depth: (cm) 0.2 Area: (  cm) 0.565 Volume: (cm) 0.113 % Reduction in Area: -300.7% % Reduction in Volume: -707.1% Epithelialization: Small (1-33%) Tunneling: No Undermining: Yes Starting Position (o'clock): 7 Ending Position (o'clock): 12 Maximum Distance: (cm) 0.2 Wound  Description Classification: Category/Stage II Wound Margin: Distinct, outline attached Exudate Amount: Medium Exudate Type: Serosanguineous Exudate Color: red, Yu Foul Odor After Cleansing: No Slough/Fibrino Yes Wound Bed Granulation Amount: Medium (34-66%) Exposed Structure Granulation Quality: Pink Fascia Exposed: No Necrotic Amount: Medium (34-66%) Fat Layer (Subcutaneous Tissue) Exposed: Yes Necrotic Quality: Adherent Slough Tendon Exposed: No Muscle Exposed: No Joint Exposed: No Bone Exposed: No Periwound Skin Texture Texture Color No Abnormalities Noted: Yes No Abnormalities Noted: No Rubor: Yes Moisture No Abnormalities Noted: Yes Temperature / Pain Temperature: No Abnormality Treatment Notes Wound #4 (Ankle) Wound Laterality: Left, Lateral Cleanser Normal Saline Discharge Instruction: Cleanse the wound with Normal Saline prior to applying Yu clean dressing using gauze sponges, not tissue or cotton balls. Byram Ancillary Kit - 15 Day Supply Discharge Instruction: Use supplies as instructed; Kit contains: (15) Saline Bullets; (15) 3x3 Gauze; 15 pr Gloves Peri-Wound Care Skin Prep Discharge Instruction: Use skin prep as directed Topical Primary Dressing Hydrofera Blue Ready Transfer Foam, 2.5x2.5 (in/in) Discharge Instruction: Apply directly to wound bed as directed Secondary Dressing Zetuvit Plus Silicone Border Dressing 4x4 (in/in) Discharge Instruction: Apply silicone border over primary dressing as directed. Secured With Compression Andrea Yu, Andrea Yu ([id number]) [id number] Page 10 of 10 Compression Stockings Add-Ons Electronic Signature(s) Signed: 03/25/2022 4:27:41 PM By: Blanche East RN Signed: 03/25/2022 4:53:10 PM By: Dellie Catholic RN Entered By: Blanche East on 03/25/2022 13:10:08 -------------------------------------------------------------------------------- Vitals Details Patient Name: Date of Service: Andrea Yu 03/25/2022 12:45 PM Medical Record Number: [id number] Patient Account Number: [id number] Date of Birth/Sex: Treating RN: 1940/03/19 (81 y.o. Andrea Yu Primary Care Niall Illes: Leanna Battles Other Clinician: Referring Juelz Claar: Treating Andrea Yu/Extender: Kathlyn Sacramento in Treatment: 17 Vital Signs Time Taken: 12:57 Temperature (F): 97.9 Height (in): 59 Pulse (bpm): 80 Weight (lbs): 155 Respiratory Rate (breaths/min): 18 Body Mass Index (BMI): 31.3 Blood Pressure (mmHg): 160/83 Reference Range: 80 - 120 mg / dl Electronic Signature(s) Signed: 03/25/2022 4:53:10 PM By: Dellie Catholic RN Entered By: Dellie Catholic on 03/25/2022 12:57:49

## 2022-03-29 ENCOUNTER — Telehealth: Payer: Self-pay | Admitting: Cardiovascular Disease

## 2022-03-29 NOTE — Telephone Encounter (Signed)
Patient is calling to talk with Dr. Sallyanne Kuster or nurse.

## 2022-03-29 NOTE — Telephone Encounter (Signed)
Attempted to call pt. No answer, unable to leave message.

## 2022-04-07 ENCOUNTER — Telehealth: Payer: Self-pay | Admitting: Cardiovascular Disease

## 2022-04-07 DIAGNOSIS — I10 Essential (primary) hypertension: Secondary | ICD-10-CM

## 2022-04-07 MED ORDER — METOPROLOL SUCCINATE ER 50 MG PO TB24: ORAL_TABLET | ORAL | 3 refills | Status: DC

## 2022-04-07 NOTE — Telephone Encounter (Signed)
Please refill at 75 mg daily (either 50 mg tablets 1.5 tabs daily or two separate prescriptions of 50 mg and 25 mg tabs, whatever insurance allows and patient prefers).

## 2022-04-07 NOTE — Telephone Encounter (Signed)
Patient daughter states that patient is taking '75mg'$  daily since release from hospital October 2022.  Note showed that she was to take the 75 mg up until  05/2021 but unsure thereafter.  Daughter states she has continued with the '75mg'$  dose and that they had extra medication from her hospital visits so were scoring the '50mg'$  and taking half ('25mg'$ ) with her regular '50mg'$  dose.  Please advise if OK to send in RX for '25mg'$  so she can continue this medication regimen.  They have run out because she did not have a prescription showing the increased dose.  At this time the '50mg'$  has been sent as patient is out of medication and she will take '75mg'$  until she hears from provider.  No BP readings as patient not at home.

## 2022-04-07 NOTE — Telephone Encounter (Signed)
Left message asking pt to call the clinic.

## 2022-04-07 NOTE — Telephone Encounter (Signed)
LVM to please call back to our office

## 2022-04-07 NOTE — Telephone Encounter (Signed)
Pt's daughter [name] is returning call

## 2022-04-07 NOTE — Telephone Encounter (Signed)
Pt c/o medication issue:  1. Name of Medication: metoprolol succinate (TOPROL-XL) 50 MG 24 hr tablet   2. How are you currently taking this medication (dosage and times per day)? 75 mg 1 x daily   3. Are you having a reaction (difficulty breathing--STAT)?  4. What is your medication issue? Hospital raised this medication to 75 mg 1x daily and pt has run out of this medication due to having to take extra. Pharmacy will not refill. Requesting call back to discuss.

## 2022-04-08 ENCOUNTER — Encounter (HOSPITAL_BASED_OUTPATIENT_CLINIC_OR_DEPARTMENT_OTHER): Payer: Medicare Other | Attending: General Surgery | Admitting: Internal Medicine

## 2022-04-08 DIAGNOSIS — E2740 Unspecified adrenocortical insufficiency: Secondary | ICD-10-CM | POA: Diagnosis not present

## 2022-04-08 DIAGNOSIS — Z7952 Long term (current) use of systemic steroids: Secondary | ICD-10-CM | POA: Diagnosis not present

## 2022-04-08 DIAGNOSIS — L89522 Pressure ulcer of left ankle, stage 2: Secondary | ICD-10-CM | POA: Diagnosis not present

## 2022-04-08 DIAGNOSIS — L98415 Non-pressure chronic ulcer of buttock with muscle involvement without evidence of necrosis: Secondary | ICD-10-CM | POA: Insufficient documentation

## 2022-04-08 DIAGNOSIS — Z8249 Family history of ischemic heart disease and other diseases of the circulatory system: Secondary | ICD-10-CM | POA: Insufficient documentation

## 2022-04-08 DIAGNOSIS — Z6831 Body mass index (BMI) 31.0-31.9, adult: Secondary | ICD-10-CM | POA: Insufficient documentation

## 2022-04-08 DIAGNOSIS — L0231 Cutaneous abscess of buttock: Secondary | ICD-10-CM | POA: Insufficient documentation

## 2022-04-08 DIAGNOSIS — Z87891 Personal history of nicotine dependence: Secondary | ICD-10-CM | POA: Diagnosis not present

## 2022-04-08 DIAGNOSIS — L89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, stage 3: Secondary | ICD-10-CM | POA: Diagnosis not present

## 2022-04-08 DIAGNOSIS — I10 Essential (primary) hypertension: Secondary | ICD-10-CM | POA: Diagnosis not present

## 2022-04-08 MED ORDER — METOPROLOL SUCCINATE ER 50 MG PO TB24: 75.0000 mg | ORAL_TABLET | Freq: Every day | ORAL | 3 refills | Status: DC

## 2022-04-08 NOTE — Telephone Encounter (Signed)
Called, no answer, left message and call back number.  Pt needs to take '75mg'$  daily. Sent in prescription to [facility] for '75mg'$  daily ('50mg'$  tablet= 1.5 tablets a day)

## 2022-04-09 NOTE — Progress Notes (Signed)
AUDA, CLIFT Yu ([id number]) [id number] Page 1 of 9 Visit Report for 04/08/2022 Chief Complaint Document Details Patient Name: Date of Service: Andrea Yu 04/08/2022 11:15 [name] Medical Record Number: [id number] Patient Account Number: [id number] Date of Birth/Sex: Treating RN: [birth year] (82 y.o. F) Primary Care Provider: Leanna Battles Other Clinician: Referring Provider: Treating Provider/Extender: Darrold Junker in Treatment: 19 Information Obtained from: Patient Chief Complaint Patient is at the clinic for treatment of an open pressure ulcer on her right medial ankle, and Yu large abscess on her right buttock Electronic Signature(s) Signed: 04/08/2022 3:34:03 PM By: Kalman Shan DO Entered By: Kalman Shan on 04/08/2022 12:34:49 -------------------------------------------------------------------------------- HPI Details Patient Name: Date of Service: Andrea Yu. 04/08/2022 11:15 [name] Medical Record Number: [id number] Patient Account Number: [id number] Date of Birth/Sex: Treating RN: April 23, 1940 (82 y.o. 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 She is not diabetic and quit smoking over 40 years ago. 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 She has developed Yu pressure ulcer on the right medial malleolus. In addition, she apparently has had multiple cutaneous abscesses on her buttocks that have required repeated incision and drainage procedures. She has developed Yu large abscess on her right buttock that has become painful. She was recently treated with  cephalexin by her PCP for urinary tract infection and also received Yu shot of Rocephin for the abscess, but it has not yet been incised or drained. She is nonambulatory but is able to stand to transfer. She does not wear any sort of Prevalon boot. ABI in clinic today was 0.96. On her right medial malleolus, there is Yu circular ulcer. The surface is dry and fibrotic. There is some periwound erythema, but no malodor or purulent drainage. On her right buttock, there is Yu large fluctuant bulge about the size of an orange. It is also erythematous and tender. 12/04/2021: The culture from her abscess grew out staph. Yu 10-day course of Bactrim was prescribed and she is currently taking this. Unfortunately, Dakin's solution was not delivered and only 2 x 2 gauze was sent, rather than Kerlix so the packing of the wound has been rather suboptimal. The wound cavity has light slough over all of the surfaces. Although covered, bone is palpable. The medial ankle wound looks about the same with Yu layer of slough accumulation. In addition, her daughter peeled some dry skin off of her foot this morning and she now has denuded areas over the majority of the plantar surface of her right foot. 01/01/2022: The patient has missed several visits and to her daughters report that it is somewhat difficult to get her here on Yu weekly basis. In the interim, she has developed Yu new pressure ulcer on her left lateral malleolus. The plantar surface of her right foot has healed. The cavity on her right buttock has contracted, but it remains quite deep and the trochanter, while not exposed, is palpable at the base. The pressure ulcer on her right medial malleolus is basically unchanged. 02/09/2022: All wounds are little bit smaller today. As the buttock abscess cavity has contracted, it has left some tunneling that has not been adequately packed. Light slough accumulation on both  ankle wounds. 02/26/2022: All of the wounds continue to  contract. There is slough on both ankle wounds. The buttock ulcer is clean. She brought her wound VAC with her today. 03/12/2022: The ankle wounds are about the same this week with slough accumulation. The buttock ulcer is smaller and quite clean. She decided that she could not tolerate the discomfort of the wound VAC and removed it. They have been packing the wound with Dakin's moistened gauze. 03/25/2022: The ankle wounds are slightly smaller. Both have slough accumulation. The orifice of the buttock ulcer has gotten quite Yu bit smaller than the underlying cavity. She has decided that she would like to have the wound VAC back. 3/14; patient presents for follow-up. She has bilateral ankle wounds and has been using Hydrofera Blue to the these wound beds. She has Prevalon boots to help with offloading. She also has Yu sacral wound that we have been using Yu wound VAC for. She has no issues or complaints today. Electronic Signature(s) Signed: 04/08/2022 3:34:03 PM By: Kalman Shan DO Entered By: Kalman Shan on 04/08/2022 12:35:25 Andrea Yu ([id number]) [id number] Page 2 of 9 -------------------------------------------------------------------------------- Physical Exam Details Patient Name: Date of Service: Andrea Yu 04/08/2022 11:15 [name] Medical Record Number: [id number] Patient Account Number: [id number] Date of Birth/Sex: Treating RN: July 09, 1940 (82 y.o. F) Primary Care Provider: Leanna Battles Other Clinician: Referring Provider: Treating Provider/Extender: Darrold Junker in Treatment: 19 Constitutional respirations regular, non-labored and within target range for patient.Marland Kitchen Psychiatric pleasant and cooperative. Notes Punched-out wounds to the ankles bilaterally with pale granulation tissue. No signs of surrounding infection. T the sacrum there is Yu large open wound that has o increased depth but no probing to bone.  Again this tissue also appears unhealthy with pale granulation tissue. Electronic Signature(s) Signed: 04/08/2022 3:34:03 PM By: Kalman Shan DO Entered By: Kalman Shan on 04/08/2022 12:36:57 -------------------------------------------------------------------------------- Physician Orders Details Patient Name: Date of Service: Andrea Yu. 04/08/2022 11:15 [name] Medical Record Number: [id number] Patient Account Number: [id number] Date of Birth/Sex: Treating RN: 1940-12-26 (82 y.o. [place] Brown Primary Care Provider: Leanna Battles Other Clinician: Referring Provider: Treating Provider/Extender: Darrold Junker in [id number] Verbal / Phone Orders: No Diagnosis Coding Follow-up Appointments ppointment in 2 weeks. - Dr. Celine Ahr - Room 1+++ Extra Time++++ Return Yu Wound Vac Anesthetic (In clinic) Topical Lidocaine 4% applied to wound bed Bathing/ Shower/ Hygiene May shower and wash wound with soap and water. Off-Loading Multipodus Splint to: - prevalon boot to both feet Turn and reposition every 2 hours Home Health Dressing changes to be completed by [place] on Monday / Wednesday / Friday except when patient has scheduled visit at [facility]. Other Home Health Orders/Instructions: - [facility] Wound Treatment Wound #1 - Malleolus Wound Laterality: Right, Medial Cleanser: Normal Saline (Generic) Every Other Day/30 Days Discharge Instructions: Cleanse the wound with Normal Saline prior to applying Yu clean dressing using gauze sponges, not tissue or cotton balls. Cleanser: Byram Ancillary Kit - 15 Day Supply (Generic) Every Other Day/30 Days Discharge Instructions: Use supplies as instructed; Kit contains: (15) Saline Bullets; (15) 3x3 Gauze; 15 pr Gloves Peri-Wound Care: Skin Prep (Generic) Every Other Day/30 Days Discharge Instructions: Use skin prep as directed Prim Dressing: Hydrofera Blue Ready Transfer Foam, 2.5x2.5 (in/in)  Every Other Day/30 Days ary Discharge Instructions: Apply directly to wound bed as directed Secondary Dressing: Zetuvit Plus Silicone Border Dressing 4x4 (in/in) (Generic) Every Other Day/30  Days Andrea Yu, Andrea Yu ([id number]) [phone].pdf Page 3 of 9 Discharge Instructions: Apply silicone border over primary dressing as directed. Wound #2 - Gluteus Wound Laterality: Right Cleanser: Normal Saline 1 x Per Day/30 Days Discharge Instructions: Cleanse the wound with Normal Saline prior to applying Yu clean dressing using gauze sponges, not tissue or cotton balls. Peri-Wound Care: Skin Prep (Generic) 1 x Per Day/30 Days Discharge Instructions: Use skin prep as directed Wound #4 - Ankle Wound Laterality: Left, Lateral Cleanser: Normal Saline (Generic) Every Other Day/30 Days Discharge Instructions: Cleanse the wound with Normal Saline prior to applying Yu clean dressing using gauze sponges, not tissue or cotton balls. Cleanser: Byram Ancillary Kit - 15 Day Supply (Generic) Every Other Day/30 Days Discharge Instructions: Use supplies as instructed; Kit contains: (15) Saline Bullets; (15) 3x3 Gauze; 15 pr Gloves Peri-Wound Care: Skin Prep (Generic) Every Other Day/30 Days Discharge Instructions: Use skin prep as directed Prim Dressing: Hydrofera Blue Ready Transfer Foam, 2.5x2.5 (in/in) Every Other Day/30 Days ary Discharge Instructions: Apply directly to wound bed as directed Secondary Dressing: Zetuvit Plus Silicone Border Dressing 4x4 (in/in) (Generic) Every Other Day/30 Days Discharge Instructions: Apply silicone border over primary dressing as directed. Electronic Signature(s) Signed: 04/08/2022 4:36:57 PM By: Dellie Catholic RN Signed: 04/09/2022 12:33:12 PM By: Kalman Shan DO Previous Signature: 04/08/2022 3:34:03 PM Version By: Kalman Shan DO Entered By: Dellie Catholic on 04/08/2022  16:34:22 -------------------------------------------------------------------------------- Problem List Details Patient Name: Date of Service: Andrea Yu. 04/08/2022 11:15 [name] Medical Record Number: [id number] Patient Account Number: [id number] Date of Birth/Sex: Treating RN: 10-Jun-1940 (82 y.o. 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 01/01/2022 No Yes evidence of necrosis L02.31 Cutaneous abscess of buttock 11/24/2021 No Yes E27.40 Unspecified adrenocortical insufficiency 11/24/2021 No Yes Z79.52 Long term (current) use of systemic steroids 11/24/2021 No Yes Andrea Yu, Andrea Yu ([id number]) [id number] Page 4 of 9 I10 Essential (primary) hypertension 11/24/2021 No Yes Inactive Problems Resolved Problems ICD-10 Code Description Active Date Resolved Date L97.511 Non-pressure chronic ulcer of other part of right foot limited to breakdown of skin 12/04/2021 12/04/2021 Electronic Signature(s) Signed: 04/08/2022 3:34:03 PM By: Kalman Shan DO Entered By: Kalman Shan on 04/08/2022 12:34:40 -------------------------------------------------------------------------------- Progress Note Details Patient Name: Date of Service: Andrea Yu. 04/08/2022 11:15 [name] Medical Record Number: [id number] Patient Account Number: [id number] Date of Birth/Sex: Treating RN: 1940/12/18 (82 y.o. 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 She is not diabetic and quit smoking over 40 years ago. 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 She has developed Yu pressure ulcer on the right medial malleolus. In addition, she apparently has had multiple cutaneous abscesses on her buttocks that have required repeated incision and  drainage procedures. She has developed Yu large abscess on her right buttock that has become painful. She was recently treated with cephalexin by her PCP for urinary tract infection and also received Yu shot of Rocephin for the abscess, but it has not yet been incised or drained. She is nonambulatory but is able to stand to transfer. She does not wear any sort of Prevalon boot. ABI in clinic today was 0.96. On her right medial malleolus, there is Yu circular ulcer. The surface is dry and fibrotic. There is some periwound erythema, but no malodor or purulent drainage. On her right buttock, there is Yu large fluctuant bulge about the size of an orange. It is also erythematous and tender. 12/04/2021: The culture from her abscess grew out staph. Yu 10-day course of Bactrim was prescribed and she is currently taking this. Unfortunately, Dakin's solution was not delivered and only 2 x 2 gauze was sent, rather than Kerlix so the packing of the wound has been rather suboptimal. The wound cavity has light slough over all of the surfaces. Although covered, bone is palpable. The medial ankle wound looks about the same with Yu layer of slough accumulation. In addition, her daughter peeled some dry skin off of her  foot this morning and she now has denuded areas over the majority of the plantar surface of her right foot. 01/01/2022: The patient has missed several visits and to her daughters report that it is somewhat difficult to get her here on Yu weekly basis. In the interim, she has developed Yu new pressure ulcer on her left lateral malleolus. The plantar surface of her right foot has healed. The cavity on her right buttock has contracted, but it remains quite deep and the trochanter, while not exposed, is palpable at the base. The pressure ulcer on her right medial malleolus is basically unchanged. 02/09/2022: All wounds are little bit smaller today. As the buttock abscess cavity has contracted, it has left some tunneling that has not been adequately packed. Light slough accumulation on both ankle wounds. 02/26/2022: All of the wounds continue to contract. There is slough on both ankle wounds. The buttock ulcer is clean. She brought her wound VAC with her today. 03/12/2022: The ankle wounds are about the same this week with slough accumulation. The buttock ulcer is smaller and quite clean. She decided that she could not tolerate the discomfort of the wound VAC and removed it. They have been packing the wound with Dakin's moistened gauze. 03/25/2022: The ankle wounds are slightly smaller. Both have slough accumulation. The orifice of the buttock ulcer has gotten quite Yu bit smaller than the underlying cavity. She has decided that she would like to have the wound VAC back. 3/14; patient presents for follow-up. She has bilateral ankle wounds and has been using Hydrofera Blue to the these wound beds. She has Prevalon boots to help with offloading. She also has Yu sacral wound that we have been using Yu wound VAC for. She has no issues or complaints today. Patient History Information obtained from Patient, Caregiver, Chart. Family History Andrea Yu, Andrea Yu ([id number]) [id number] Page 5 of  9 Cancer - Father,Maternal Grandparents,Siblings,Child, Heart Disease - Father, Hypertension - Father, Thyroid Problems - Mother, No family history of Diabetes, Hereditary Spherocytosis, Kidney Disease, Lung Disease, Seizures, Stroke, Tuberculosis. Social History Former smoker - quit 13 yr ago, Marital Status - Widowed, Alcohol Use - Never, Drug Use - No History, Caffeine Use - Daily - tea, soda. Medical History Eyes Patient has history  of Cataracts - bil extracted Denies history of Glaucoma, Optic Neuritis Cardiovascular Patient has history of Arrhythmia - afib, Hypertension Endocrine Denies history of Type I Diabetes, Type II Diabetes Genitourinary Denies history of End Stage Renal Disease Integumentary (Skin) Denies history of History of Burn Musculoskeletal Patient has history of Osteoarthritis, Osteomyelitis - pelvic Neurologic Patient has history of Neuropathy Oncologic Denies history of Received Chemotherapy, Received Radiation Psychiatric Patient has history of Confinement Anxiety Denies history of Anorexia/bulimia Hospitalization/Surgery History - TEE. - right ankle fusion. - bil knee replacements. - bil cataract extractions. - posterior lumbar fusion. - vaginal hysterectomy. - cholecystectomy. 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 Vitals Time Taken: 11:19 AM, Height: 59 in, Weight: 155 lbs, BMI: 31.3, Temperature: 97.8 F, Pulse: 91 bpm, Respiratory Rate: 20 breaths/min, Blood Pressure: 117/71 mmHg. Psychiatric pleasant and cooperative. General Notes: Punched-out wounds to the ankles bilaterally with pale  granulation tissue. No signs of surrounding infection. T the sacrum there is Yu large open o wound that has increased depth but no probing to bone. Again this tissue also appears unhealthy with pale granulation tissue. Integumentary (Hair, Skin) Wound #1 status is Open. Original cause of wound was Pressure Injury. The date acquired was: 11/16/2021. The wound has been in treatment 19 weeks. The wound is located on the Right,Medial Malleolus. The wound measures 1.4cm length x 1.7cm width x 0.2cm depth; 1.869cm^2 area and 0.374cm^3 volume. There is Fat Layer (Subcutaneous Tissue) exposed. There is no tunneling or undermining noted. There is Yu medium amount of serosanguineous drainage noted. The wound margin is distinct with the outline attached to the wound base. There is small (1-33%) red granulation within the wound bed. There is Yu large (67-100%) amount of necrotic tissue within the wound bed including Adherent Slough. The periwound skin appearance had no abnormalities noted for texture. The periwound skin appearance had no abnormalities noted for moisture. The periwound skin appearance exhibited: Erythema. The surrounding wound skin color is noted with erythema. Erythema is measured at 1 cm. Periwound temperature was noted as No Abnormality. Wound #2 status is Open. Original cause of wound was Bump. The date acquired was: 09/28/2021. The wound has been in treatment 19 weeks. The wound is located on the Right Gluteus. The wound measures 3cm length x 1cm width x 3.2cm depth; 2.356cm^2 area and 7.54cm^3 volume. There is bone and Fat Layer (Subcutaneous Tissue) exposed. There is tunneling at 5:00 with Yu maximum distance of 4.2cm. There is additional tunneling and at :00. There is Yu large amount of serosanguineous drainage noted. The wound margin is well defined and not attached to the wound base. There is large (67-100%) pink granulation within the wound bed. There is Yu small (1-33%) amount of necrotic  tissue within the wound bed including Adherent Slough. The periwound skin appearance had no abnormalities noted for texture. The periwound skin appearance had no abnormalities noted for moisture. The periwound skin appearance had no abnormalities noted for color. Periwound temperature was noted as No Abnormality. The periwound has tenderness on palpation. Wound #2 status is Open. Original cause of wound was Bump. The date acquired was: 09/28/2021. The wound has been in treatment 19 weeks. The wound is located on the Right Gluteus. The wound measures 3cm length x 1cm width x 2cm depth;  2.356cm^2 area and 4.712cm^3 volume. There is bone and Fat Layer (Subcutaneous Tissue) exposed. There is tunneling at :00. There is additional tunneling and at :00. There is Yu large amount of serosanguineous drainage noted. The wound margin is well defined and not attached to the wound base. There is large (67-100%) pink granulation within the wound bed. There is Yu small (1-33%) Andrea Yu, Andrea Yu ([id number]) [id number] Page 6 of 9 amount of necrotic tissue within the wound bed including Adherent Slough. The periwound skin appearance had no abnormalities noted for texture. The periwound skin appearance had no abnormalities noted for moisture. The periwound skin appearance had no abnormalities noted for color. Periwound temperature was noted as No Abnormality. The periwound has tenderness on palpation. Wound #4 status is Open. Original cause of wound was Pressure Injury. The date acquired was: 12/23/2021. The wound has been in treatment 13 weeks. The wound is located on the Left,Lateral Ankle. The wound measures 0.9cm length x 0.8cm width x 0.2cm depth; 0.565cm^2 area and 0.113cm^3 volume. There is Fat Layer (Subcutaneous Tissue) exposed. There is Yu medium amount of serosanguineous drainage noted. The wound margin is distinct with the outline attached to the wound base. There is medium (34-66%) pink  granulation within the wound bed. There is Yu medium (34-66%) amount of necrotic tissue within the wound bed including Adherent Slough. The periwound skin appearance had no abnormalities noted for texture. The periwound skin appearance had no abnormalities noted for moisture. The periwound skin appearance exhibited: Rubor. Periwound temperature was noted as No Abnormality. 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 No signs of infection. I recommended continuing the course with Hydrofera Blue to the ankle wounds and wound VAC to the sacral wound. Follow-up in 1 week. Continue to use Prevalon boots when not ambulating to help with offloading. Plan Follow-up Appointments: Return Appointment in 2 weeks. - Dr. Celine Ahr - Room 1+++ Extra Time++++ Wound Vac Anesthetic: (In clinic) Topical Lidocaine 4% applied to wound bed Bathing/ Shower/ Hygiene: May shower and wash wound with soap and water. Off-Loading: Multipodus Splint to: - prevalon boot to both feet Turn and reposition every 2 hours Home Health: Dressing changes to be completed by [place] on Monday / Wednesday / Friday except when patient has scheduled visit at [facility]. Other Home Health Orders/Instructions: - [facility] WOUND #1: - Malleolus Wound Laterality: Right, Medial Cleanser: Normal Saline (Generic) Every Other Day/30 Days Discharge Instructions: Cleanse the wound with Normal Saline prior to applying Yu clean dressing using gauze sponges, not tissue or cotton balls. Cleanser: Byram Ancillary Kit - 15 Day Supply (Generic) Every Other Day/30 Days Discharge Instructions: Use supplies as  instructed; Kit contains: (15) Saline Bullets; (15) 3x3 Gauze; 15 pr Gloves Peri-Wound Care: Skin Prep (Generic) Every Other Day/30 Days Discharge Instructions: Use skin prep as directed Prim Dressing: Hydrofera Blue Ready Transfer Foam, 2.5x2.5 (in/in) Every Other Day/30 Days ary Discharge Instructions: Apply directly to wound bed as directed Secondary Dressing: Zetuvit Plus Silicone Border Dressing 4x4 (in/in) (Generic) Every Other Day/30 Days Discharge Instructions: Apply silicone border over primary dressing as directed. WOUND #2: - Gluteus Wound Laterality: Right Cleanser: Normal Saline 1 x Per Day/30 Days Discharge Instructions: Cleanse the wound with Normal Saline prior to applying  Yu clean dressing using gauze sponges, not tissue or cotton balls. Peri-Wound Care: Skin Prep (Generic) 1 x Per Day/30 Days Discharge Instructions: Use skin prep as directed WOUND #4: - Ankle Wound Laterality: Left, Lateral Cleanser: Normal Saline (Generic) Every Other Day/30 Days Discharge Instructions: Cleanse the wound with Normal Saline prior to applying Yu clean dressing using gauze sponges, not tissue or cotton balls. Cleanser: [employer] Ancillary Kit - 15 Day Supply (Generic) Every Other Day/30 Days Discharge Instructions: Use supplies as instructed; Kit contains: (15) Saline Bullets; (15) 3x3 Gauze; 15 pr Gloves Peri-Wound Care: Skin Prep (Generic) Every Other Day/30 Days Discharge Instructions: Use skin prep as directed Prim Dressing: Hydrofera Blue Ready Transfer Foam, 2.5x2.5 (in/in) Every Other Day/30 Days ary Discharge Instructions: Apply directly to wound bed as directed Secondary Dressing: Zetuvit Plus Silicone Border Dressing 4x4 (in/in) (Generic) Every Other Day/30 Days Discharge Instructions: Apply silicone border over primary dressing as directed. 1. Hydrofera Blue 2. Wound VAC 3. Follow-up in 1 week Electronic Signature(s) Signed: 04/09/2022 12:33:12 PM By: Kalman Shan DO Signed:  04/09/2022 5:52:30 PM By: Deon Pilling RN, BSN Andrea Yu, Signed: ([id number]) Andrea Yu 04/09/2022 5:52:30 PM By: Deon Pilling RN, BSN [id number] Page 7 of 9 Previous Signature: 04/08/2022 3:34:03 PM Version By: Kalman Shan DO Entered By: Deon Pilling on 04/09/2022 08:15:27 -------------------------------------------------------------------------------- HxROS Details Patient Name: Date of Service: Andrea Yu. 04/08/2022 11:15 [name] Medical Record Number: [id number] Patient Account Number: [id number] Date of Birth/Sex: Treating RN: 1940-12-07 (82 y.o. F) Primary Care Provider: Leanna Battles Other Clinician: Referring Provider: Treating Provider/Extender: Darrold Junker in Treatment: 19 Information Obtained From Patient Caregiver Chart Constitutional Symptoms (General Health) Medical History: Past Medical History Notes: morbid obesity Eyes Medical History: Positive for: Cataracts - bil extracted Negative for: Glaucoma; Optic Neuritis Ear/Nose/Mouth/Throat Medical History: Past Medical History Notes: hard of hearing Cardiovascular Medical History: Positive for: Arrhythmia - afib; Hypertension Gastrointestinal Medical History: Past Medical History Notes: GERD Endocrine Medical History: Negative for: Type I Diabetes; Type II Diabetes Past Medical History Notes: adrenal insufficiency Genitourinary Medical History: Negative for: End Stage Renal Disease Integumentary (Skin) Medical History: Negative for: History of Burn Musculoskeletal Medical History: Positive for: Osteoarthritis; Osteomyelitis - pelvic Past Medical History Notes: psoriatic arthritis, thoracic discitis, displaced spiral fx right tibia Neurologic Medical History: Positive for: Neuropathy Past Medical History Notes: hx [place] Andrea, USUI Yu ([id number]) [id number] Page 8 of 9 Oncologic Medical History: Negative for:  Received Chemotherapy; Received Radiation Psychiatric Medical History: Positive for: Confinement Anxiety Negative for: Anorexia/bulimia HBO Extended History Items Eyes: Cataracts Immunizations Pneumococcal Vaccine: Received Pneumococcal Vaccination: Yes Received Pneumococcal Vaccination On or After 60th Birthday: Yes Implantable Devices None Hospitalization / Surgery History Type of Hospitalization/Surgery TEE right ankle fusion bil knee replacements bil cataract extractions posterior lumbar fusion vaginal hysterectomy cholecystectomy Family and Social History Cancer: Yes - Father,Maternal Grandparents,Siblings,Child; Diabetes: No; Heart Disease: Yes - Father; Hereditary Spherocytosis: No; Hypertension: Yes - Father; Kidney Disease: No; Lung Disease: No; Seizures: No; Stroke: No; Thyroid Problems: Yes - Mother; Tuberculosis: No; Former smoker - quit 11 yr ago; Marital Status - Widowed; Alcohol Use: Never; Drug Use: No History; Caffeine Use: Daily - tea, soda; Financial Concerns: No; Food, Clothing or Shelter Needs: No; Support System Lacking: No; Transportation Concerns: No Electronic Signature(s) Signed: 04/08/2022 3:34:03 PM By: Kalman Shan DO Entered By: Kalman Shan on 04/08/2022 12:35:31 -------------------------------------------------------------------------------- SuperBill Details Patient Name: Date of Service: Andrea Yu 04/08/2022 Medical Record Number: [id number] Patient Account  Number: [id number] Date of Birth/Sex: Treating RN: 01/06/1941 (82 y.o. 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 L02.31 Cutaneous abscess of buttock E27.40 Unspecified adrenocortical insufficiency Z79.52 Long term (current) use of systemic steroids I10 Essential (primary) hypertension Facility Procedures : Andrea Yu, Andrea Yu Code: [id number] Andrea Yu ([id number]) Description: 97605 - WOUND VAC-50 SQ CM OR LESS [phone] Modifier: [id number] Quantity: 1 7.pdf Page 9 of 9 Physician Procedures : CPT4 Code Description Modifier [id number] - WC PHYS LEVEL 3 - EST PT ICD-10 Diagnosis Description 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, stage 3 L89.522 Pressure ulcer of left ankle, stage 2 L98.415 Non-pressure chronic ulcer of buttock with muscle  involvement without evidence of necrosis L02.31 Cutaneous abscess of buttock Quantity: 1 Electronic Signature(s) Signed: 04/08/2022 3:34:03 PM By: Kalman Shan DO Entered By: Kalman Shan on 04/08/2022 12:38:23

## 2022-04-09 NOTE — Telephone Encounter (Signed)
Spoke with daughter in regard to confirmation of dosage and new prescription for 50mg  take 1 1/2 tablet.  She states understanding and has picked up the medication.

## 2022-04-09 NOTE — Progress Notes (Signed)
TATYANIA, FAUROT Yu ([id number]) [id number] Page 1 of 11 Visit Report for 04/08/2022 Arrival Information Details Patient Name: Date of Service: Andrea Yu 04/08/2022 11:15 [name] Medical Record Number: [id number] Patient Account Number: [id number] Date of Birth/Sex: Treating RN: Andrea Yu (82 y.o. F) Primary Care Andrea Yu: Andrea Yu Other Clinician: Referring Andrea Yu: Treating Andrea Yu/Extender: Andrea Yu in Treatment: 39 Visit Information History Since Last Visit All ordered tests and consults were completed: No Patient Arrived: Wheel Chair Added or deleted any medications: No Arrival Time: 11:18 Any new allergies or adverse reactions: No Accompanied By: daughter Had Yu fall or experienced change in No Transfer Assistance: Manual activities of daily living that may affect Patient Identification Verified: Yes risk of falls: Secondary Verification Process Completed: Yes Signs or symptoms of abuse/neglect since last visito No Patient Requires Transmission-Based Precautions: No Hospitalized since last visit: No Patient Has Alerts: No Implantable device outside of the clinic excluding No cellular tissue based products placed in the center since last visit: Pain Present Now: No Electronic Signature(s) Signed: 04/08/2022 12:51:31 PM By: Andrea Yu Entered By: Andrea Yu on 04/08/2022 11:19:01 -------------------------------------------------------------------------------- Encounter Discharge Information Details Patient Name: Date of Service: Andrea Yu. 04/08/2022 11:15 [name] Medical Record Number: [id number] Patient Account Number: [id number] Date of Birth/Sex: Treating RN: Andrea Yu Primary Care Amberle Lyter: Andrea Yu Other Clinician: Referring Andrea Yu: Treating Naira Standiford/Extender: Andrea Yu in Treatment: 19 Encounter Discharge Information  Items Discharge Condition: Stable Ambulatory Status: Wheelchair Discharge Destination: Home Transportation: Private Auto Accompanied By: daughter Schedule Follow-up Appointment: Yes Clinical Summary of Care: Patient Declined Electronic Signature(s) Signed: 04/08/2022 4:36:57 PM By: Andrea Catholic RN Entered By: Andrea Yu on 04/08/2022 16:32:48 -------------------------------------------------------------------------------- Lower Extremity Assessment Details Patient Name: Date of Service: Andrea Yu 04/08/2022 11:15 [name] Medical Record Number: [id number] Patient Account Number: [id number] Date of Birth/Sex: Treating RN: [birth year]Yu (82 y.o. [place] Yu Primary Care Lashanna Angelo: Andrea Yu Other Clinician: Referring Asmaa Tirpak: Treating Ra Pfiester/Extender: Andrea Yu in Treatment: 19 Edema Assessment Assessed: Andrea Yu: No] Andrea Yu: No] T[LeftEVONNE, Andrea Yu [id number] [Right: [id number] Page 2 of 11] Edema: [Left: N] [Right: o] Calf Left: Right: Point of Measurement: From Medial Instep 31.5 cm 32 cm Ankle Left: Right: Point of Measurement: From Medial Instep 17.8 cm 20.4 cm Vascular Assessment Pulses: Dorsalis Pedis Palpable: [Left:Yes] [Right:Yes] Electronic Signature(s) Signed: 04/08/2022 4:36:57 PM By: Andrea Catholic RN Entered By: Andrea Yu on 04/08/2022 11:36:27 -------------------------------------------------------------------------------- Multi Wound Chart Details Patient Name: Date of Service: Andrea Yu. 04/08/2022 11:15 [name] Medical Record Number: [id number] Patient Account Number: [id number] Date of Birth/Sex: Treating RN: [birth year] Yu (82 y.o. F) Primary Care Aeon Kessner: Andrea Yu Other Clinician: Referring Raela Bohl: Treating Daryel Kenneth/Extender: Andrea Yu in Treatment: 19 Vital Signs Height(in): 22 Pulse(bpm): 19 Weight(lbs): 155 Blood  Pressure(mmHg): 117/71 Body Mass Index(BMI): 31.3 Temperature(F): 97.8 Respiratory Rate(breaths/min): 20 [1:Photos:] [2:No Photos] Right, Medial Malleolus Right Gluteus Right Gluteus Wound Location: Pressure Injury Bump Bump Wounding Event: Pressure Ulcer Abscess Abscess Primary Etiology: Cataracts, Arrhythmia, Hypertension, Cataracts, Arrhythmia, Hypertension, Cataracts, Arrhythmia, Hypertension, Comorbid History: Osteoarthritis, Osteomyelitis, Osteoarthritis, Osteomyelitis, Osteoarthritis, Osteomyelitis, Neuropathy, Confinement Anxiety Neuropathy, Confinement Anxiety Neuropathy, Confinement Anxiety 11/16/2021 09/28/2021 09/28/2021 Date Acquired: 19 19 19  Weeks of Treatment: Open Open Open Wound Status: No No No Wound Recurrence: 1.4x1.7x0.2 3x1x3.2 3x1x2 Measurements L x W x D (cm) 1.869 2.356 2.356 Yu (cm) : rea 0.374 7.54 4.712 Volume (cm) : -  21.40% 77.40% 77.40% % Reduction in Yu rea: -142.90% 75.90% 85.00% % Reduction in Volume: 5 Position 1 (o'clock): 4.2 Maximum Distance 1 (cm): Position 2 (o'clock): No Yes Yes Tunneling: Category/Stage III Full Thickness With Exposed Support Full Thickness With Exposed Support Classification: Structures Structures Medium Large Large Exudate Amount: Serosanguineous Serosanguineous Serosanguineous Exudate Type: red, Yu red, Yu red, Yu Exudate Color: Distinct, outline attached Well defined, not attached Well defined, not attached Wound MarginLIZANIA, Andrea Yu ([id number]) [id number].pdf Page 3 of 11 Small (1-33%) Large (67-100%) Large (67-100%) Granulation Amount: Red Pink Pink Granulation Quality: Large (67-100%) Small (1-33%) Small (1-33%) Necrotic Amount: Fat Layer (Subcutaneous Tissue): Yes Fat Layer (Subcutaneous Tissue): Yes Fat Layer (Subcutaneous Tissue): Yes Exposed Structures: Fascia: No Bone: Yes Bone: Yes Tendon: No Fascia: No Fascia: No Muscle: No Tendon: No Tendon:  No Joint: No Muscle: No Muscle: No Bone: No Joint: No Joint: No None Small (1-33%) Small (1-33%) Epithelialization: No Abnormalities Noted No Abnormalities Noted No Abnormalities Noted Periwound Skin Texture: No Abnormalities Noted No Abnormalities Noted No Abnormalities Noted Periwound Skin Moisture: Erythema: Yes Erythema: Yes Erythema: Yes Periwound Skin Color: Measured: 1cm N/Yu N/Yu Erythema Measurement: No Abnormality No Abnormality No Abnormality Temperature: N/Yu Yes Yes Tenderness on Palpation: N/Yu Negative Pressure Wound Therapy Negative Pressure Wound Therapy Procedures Performed: Maintenance (NPWT) Maintenance (NPWT) Wound Number: 4 N/Yu N/Yu Photos: N/Yu N/Yu Left, Lateral Ankle N/Yu N/Yu Wound Location: Pressure Injury N/Yu N/Yu Wounding Event: Pressure Ulcer N/Yu N/Yu Primary Etiology: Cataracts, Arrhythmia, Hypertension, N/Yu N/Yu Comorbid History: Osteoarthritis, Osteomyelitis, Neuropathy, Confinement Anxiety 12/23/2021 N/Yu N/Yu Date Acquired: 13 N/Yu N/Yu Weeks of Treatment: Open N/Yu N/Yu Wound Status: No N/Yu N/Yu Wound Recurrence: 0.9x0.8x0.2 N/Yu N/Yu Measurements L x W x D (cm) 0.565 N/Yu N/Yu Yu (cm) : rea 0.113 N/Yu N/Yu Volume (cm) : -300.70% N/Yu N/Yu % Reduction in Yu rea: -707.10% N/Yu N/Yu % Reduction in Volume: N/Yu N/Yu N/Yu Tunneling: Category/Stage II N/Yu N/Yu Classification: Medium N/Yu N/Yu Exudate Yu mount: Serosanguineous N/Yu N/Yu Exudate Type: red, Yu N/Yu N/Yu Exudate Color: Distinct, outline attached N/Yu N/Yu Wound Margin: Medium (34-66%) N/Yu N/Yu Granulation Yu mount: Pink N/Yu N/Yu Granulation Quality: Medium (34-66%) N/Yu N/Yu Necrotic Yu mount: Fat Layer (Subcutaneous Tissue): Yes N/Yu N/Yu Exposed Structures: Fascia: No Tendon: No Muscle: No Joint: No Bone: No Small (1-33%) N/Yu N/Yu Epithelialization: No Abnormalities Noted N/Yu N/Yu Periwound Skin Texture: No Abnormalities Noted N/Yu N/Yu Periwound Skin Moisture: Rubor: Yes N/Yu  N/Yu Periwound Skin Color: N/Yu N/Yu N/Yu Erythema Measurement: No Abnormality N/Yu N/Yu Temperature: N/Yu N/Yu N/Yu Procedures Performed: Treatment Notes Electronic Signature(s) Signed: 04/08/2022 3:34:03 PM By: Kalman Shan DO Entered By: Kalman Shan on 04/08/2022 12:34:44 -------------------------------------------------------------------------------- Multi-Disciplinary Care Plan Details Patient Name: Date of Service: Andrea Yu. 04/08/2022 11:15 [name] Medical Record Number: [id number] Patient Account Number: [id number] Date of Birth/Sex: Treating RN: Andrea [birth year], Yu (82 y[address]. [address] [place], [place] Yu ([id number]) [id number] Page 4 of 11 Primary Care Amous Crewe: Andrea Yu Other Clinician: Referring Yash Cacciola: Treating Skylor Schnapp/Extender: Andrea Yu in Treatment: [place] reviewed with physician Active Inactive Pressure Nursing Diagnoses: Knowledge deficit related to causes and risk factors for pressure ulcer development Knowledge deficit related to management of pressures ulcers Potential for impaired tissue integrity related to pressure, friction, moisture, and shear Goals: Patient/caregiver will verbalize understanding of pressure ulcer management Date Initiated: 11/24/2021 Target Resolution Date: 05/25/2022 Goal Status: Active Interventions: Assess: immobility, friction, shearing, incontinence upon admission and as needed Assess  offloading mechanisms upon admission and as needed Assess potential for pressure ulcer upon admission and as needed Provide education on pressure ulcers Treatment Activities: Pressure reduction/relief device ordered : 11/24/2021 Notes: Soft Tissue Infection Nursing Diagnoses: Impaired tissue integrity Potential for infection: soft tissue Goals: Patient's soft tissue infection will resolve Date Initiated: 11/24/2021 Target Resolution Date:  05/25/2022 Goal Status: Active Signs and symptoms of infection will be recognized early to allow for prompt treatment Date Initiated: 11/24/2021 Date Inactivated: 01/01/2022 Target Resolution Date: 12/22/2021 Goal Status: Met Interventions: Assess signs and symptoms of infection every visit Provide education on infection Treatment Activities: Culture : 11/24/2021 Culture and sensitivity : 11/24/2021 Education provided on Infection : 12/04/2021 Systemic antibiotics : 11/24/2021 Notes: Wound/Skin Impairment Nursing Diagnoses: Impaired tissue integrity Knowledge deficit related to ulceration/compromised skin integrity Goals: Patient/caregiver will verbalize understanding of skin care regimen Date Initiated: 11/24/2021 Target Resolution Date: 05/25/2022 Goal Status: Active Ulcer/skin breakdown will have Yu volume reduction of 30% by week 4 Date Initiated: 11/24/2021 Target Resolution Date: 05/25/2022 Goal Status: Active Interventions: Assess patient/caregiver ability to obtain necessary supplies Assess patient/caregiver ability to perform ulcer/skin care regimen upon admission and as needed Assess ulceration(s) every visit Andrea Yu, Andrea Yu ([id number]) [id number] Page 5 of 11 Provide education on ulcer and skin care Treatment Activities: Skin care regimen initiated : 11/24/2021 Topical wound management initiated : 11/24/2021 Notes: Electronic Signature(s) Signed: 04/08/2022 4:36:57 PM By: Andrea Catholic RN Entered By: Andrea Yu on 04/08/2022 16:28:33 -------------------------------------------------------------------------------- Negative Pressure Wound Therapy Maintenance (NPWT) Details Patient Name: Date of Service: Andrea Yu 04/08/2022 11:15 [name] Medical Record Number: [id number] Patient Account Number: [id number] Date of Birth/Sex: Treating RN: [birth year]Yu (82 y.o. [place] Yu Primary Care Ulyana Pitones: Andrea Yu Other  Clinician: Referring Frannie Shedrick: Treating Andrea Rightmyer/Extender: Andrea Yu in Treatment: 19 NPWT Maintenance Performed for: Wound #2 Right Gluteus Performed By: Andrea Catholic, RN Type: VAC System Coverage Size (sq cm): 3 Pressure Type: Constant Pressure Setting: 125 mmHG Drain Type: None Primary Contact: Non-Adherent Sponge/Dressing Type: Foam- Black Date Initiated: 02/26/2022 Dressing Removed: Yes Quantity of Sponges/Gauze Removed: 2 Black Canister Changed: Yes Canister Exudate Volume: 30 Dressing Reapplied: Yes Quantity of Sponges/Gauze Inserted: 1 black with 1 black bridging Days On NPWT : 42 Post Procedure Diagnosis Same as Pre-procedure Electronic Signature(s) Signed: 04/08/2022 4:36:57 PM By: Andrea Catholic RN Entered By: Andrea Yu on 04/08/2022 12:27:58 -------------------------------------------------------------------------------- Pain Assessment Details Patient Name: Date of Service: Andrea Yu. 04/08/2022 11:15 [name] Medical Record Number: [id number] Patient Account Number: [id number] Date of Birth/Sex: Treating RN: [birth year]Yu (82 y.o. F) Primary Care Raisa Ditto: Andrea Yu Other Clinician: Referring Crystalynn Mcinerney: Treating Said Rueb/Extender: Andrea Yu in Treatment: 19 Active Problems Location of Pain Severity and Description of Pain Patient Has Paino No Site Locations [place] Yu ([id number]) [id number] Page 6 of 11 Pain Management and Medication Current Pain Management: Electronic Signature(s) Signed: 04/08/2022 12:51:31 PM By: Andrea Yu Entered By: Andrea Yu on 04/08/2022 11:19:32 -------------------------------------------------------------------------------- Patient/Caregiver Education Details Patient Name: Date of Service: Andrea Yu, Andrea [name] 3/14/2024andnbsp11:15 [name] Medical Record Number: [id number] Patient Account Number: [id number] Date of Birth/Gender:  Treating RN: [birth year]Yu (82 y.o. [place] Yu Primary Care Physician: Andrea Yu Other Clinician: Referring Physician: Treating Physician/Extender: Andrea Yu in Treatment: 19 Education Assessment Education Provided To: Patient Education Topics Provided Wound/Skin Impairment: Methods: Explain/Verbal Responses: Return demonstration correctly Electronic Signature(s) Signed: 04/08/2022 4:36:57 PM By: Andrea Catholic RN Entered By: Andrea Yu on  04/08/2022 16:28:51 -------------------------------------------------------------------------------- Wound Assessment Details Patient Name: Date of Service: Andrea Yu 04/08/2022 11:15 [name] Medical Record Number: [id number] Patient Account Number: [id number] Date of Birth/Sex: Treating RN: [birth year]Yu (82 y.o. [place] Yu Primary Care Jaidyn Usery: Andrea Yu Other Clinician: Referring Cartier Mapel: Treating Eduardo Wurth/Extender: Andrea Yu in Treatment: 19 Wound Status Wound Number: 1 Primary Pressure Ulcer Etiology: Wound Location: Right, Medial Malleolus Wound Open Wounding Event: Pressure Injury Status: Date Acquired: 11/16/2021 Comorbid Cataracts, Arrhythmia, Hypertension, Osteoarthritis, Weeks Of Treatment: 19 History: Osteomyelitis, Neuropathy, Confinement Anxiety Clustered Wound: No LASHANIQUE, GARCIAGARCIA Yu ([id number]) [phone].pdf Page 7 of 11 Photos Wound Measurements Length: (cm) 1.4 Width: (cm) 1.7 Depth: (cm) 0.2 Area: (cm) 1.869 Volume: (cm) 0.374 % Reduction in Area: -21.4% % Reduction in Volume: -142.9% Epithelialization: None Tunneling: No Undermining: No Wound Description Classification: Category/Stage III Wound Margin: Distinct, outline attached Exudate Amount: Medium Exudate Type: Serosanguineous Exudate Color: red, Yu Foul Odor After Cleansing: No Slough/Fibrino Yes Wound Bed Granulation Amount:  Small (1-33%) Exposed Structure Granulation Quality: Red Fascia Exposed: No Necrotic Amount: Large (67-100%) Fat Layer (Subcutaneous Tissue) Exposed: Yes Necrotic Quality: Adherent Slough Tendon Exposed: No Muscle Exposed: No Joint Exposed: No Bone Exposed: No Periwound Skin Texture Texture Color No Abnormalities Noted: Yes No Abnormalities Noted: No Erythema: Yes Moisture Erythema Measurement: Measured No Abnormalities Noted: Yes 1 cm Temperature / Pain Temperature: No Abnormality Treatment Notes Wound #1 (Malleolus) Wound Laterality: Right, Medial Cleanser Normal Saline Discharge Instruction: Cleanse the wound with Normal Saline prior to applying Yu clean dressing using gauze sponges, not tissue or cotton balls. Byram Ancillary Kit - 15 Day Supply Discharge Instruction: Use supplies as instructed; Kit contains: (15) Saline Bullets; (15) 3x3 Gauze; 15 pr Gloves Peri-Wound Care Skin Prep Discharge Instruction: Use skin prep as directed Topical Primary Dressing Hydrofera Blue Ready Transfer Foam, 2.5x2.5 (in/in) Discharge Instruction: Apply directly to wound bed as directed Secondary Dressing Zetuvit Plus Silicone Border Dressing 4x4 (in/in) Discharge Instruction: Apply silicone border over primary dressing as directed. Secured With CRUZ, MELLIS ([id number]) [id number] Page 8 of 11 Compression Wrap Compression Stockings Add-Ons Electronic Signature(s) Signed: 04/08/2022 12:51:31 PM By: Andrea Yu Signed: 04/08/2022 4:36:57 PM By: Andrea Catholic RN Entered By: Andrea Yu on 04/08/2022 11:31:05 -------------------------------------------------------------------------------- Wound Assessment Details Patient Name: Date of Service: Andrea Yu. 04/08/2022 11:15 [name] Medical Record Number: [id number] Patient Account Number: [id number] Date of Birth/Sex: Treating RN: [birth year]Apr-Yu (82 y.o. F) Primary Care Andersyn Fragoso: Andrea Yu Other  Clinician: Referring Lanier Felty: Treating Rocky Rishel/Extender: Andrea Yu in Treatment: 19 Wound Status Wound Number: 2 Primary Abscess Etiology: Wound Location: Right Gluteus Wound Open Wounding Event: Bump Status: Date Acquired: 09/28/2021 Comorbid Cataracts, Arrhythmia, Hypertension, Osteoarthritis, Weeks Of Treatment: 19 History: Osteomyelitis, Neuropathy, Confinement Anxiety Clustered Wound: No Wound Measurements Length: (cm) 3 Width: (cm) 1 Depth: (cm) 3.2 Area: (cm) 2.356 Volume: (cm) 7.54 % Reduction in Area: 77.4% % Reduction in Volume: 75.9% Epithelialization: Small (1-33%) Tunneling: Yes Location 1 Position (o'clock): 5 Maximum Distance: (cm) 4.2 Location 2 Wound Description Classification: Full Thickness With Exposed Support Structures Wound Margin: Well defined, not attached Exudate Amount: Large Exudate Type: Serosanguineous Exudate Color: red, Yu Foul Odor After Cleansing: No Slough/Fibrino Yes Wound Bed Granulation Amount: Large (67-100%) Exposed Structure Granulation Quality: Pink Fascia Exposed: No Necrotic Amount: Small (1-33%) Fat Layer (Subcutaneous Tissue) Exposed: Yes Necrotic Quality: Adherent Slough Tendon Exposed: No Muscle Exposed: No Joint Exposed: No Bone Exposed: Yes Periwound Skin Texture Texture  Color No Abnormalities Noted: Yes No Abnormalities Noted: Yes Moisture Temperature / Pain No Abnormalities Noted: Yes Temperature: No Abnormality Tenderness on Palpation: Yes Electronic Signature(s) Signed: 04/08/2022 4:36:57 PM By: Andrea Catholic RN Entered By: Andrea Yu on 04/08/2022 11:32:42 Andrea Yu ([id number]) [id number] Page 9 of 11 -------------------------------------------------------------------------------- Wound Assessment Details Patient Name: Date of Service: Andrea Yu 04/08/2022 11:15 [name] Medical Record Number: [id number] Patient Account  Number: [id number] Date of Birth/Sex: Treating RN: Yu[birth year] (82 y.o. F) Primary Care Callan Yontz: Andrea Yu Other Clinician: Referring Delontae Lamm: Treating Kaliyah Gladman/Extender: Andrea Yu in Treatment: 19 Wound Status Wound Number: 2 Primary Abscess Etiology: Wound Location: Right Gluteus Wound Open Wounding Event: Bump Status: Date Acquired: 09/28/2021 Comorbid Cataracts, Arrhythmia, Hypertension, Osteoarthritis, Weeks Of Treatment: 19 History: Osteomyelitis, Neuropathy, Confinement Anxiety Clustered Wound: No Photos Wound Measurements Length: (cm) 3 Width: (cm) 1 Depth: (cm) 2 Area: (cm) 2.356 Volume: (cm) 4.712 % Reduction in Area: 77.4% % Reduction in Volume: 85% Epithelialization: Small (1-33%) Tunneling: Yes Location 1 Location 2 Wound Description Classification: Full Thickness With Exposed Support Structures Wound Margin: Well defined, not attached Exudate Amount: Large Exudate Type: Serosanguineous Exudate Color: red, Yu Foul Odor After Cleansing: No Slough/Fibrino Yes Wound Bed Granulation Amount: Large (67-100%) Exposed Structure Granulation Quality: Pink Fascia Exposed: No Necrotic Amount: Small (1-33%) Fat Layer (Subcutaneous Tissue) Exposed: Yes Necrotic Quality: Adherent Slough Tendon Exposed: No Muscle Exposed: No Joint Exposed: No Bone Exposed: Yes Periwound Skin Texture Texture Color No Abnormalities Noted: Yes No Abnormalities Noted: Yes Moisture Temperature / Pain No Abnormalities Noted: Yes Temperature: No Abnormality Tenderness on Palpation: Yes Treatment Notes Wound #2 (Gluteus) Wound Laterality: Right Cleanser Normal Saline Discharge Instruction: Cleanse the wound with Normal Saline prior to applying Yu clean dressing using gauze sponges, not tissue or cotton balls. Peri-Wound Care Skin Prep Andrea Yu, Andrea Yu ([id number]) [id number] Page 10 of 11 Discharge Instruction: Use  skin prep as directed Topical Primary Dressing Secondary Dressing Secured With Compression Wrap Compression Stockings Add-Ons Electronic Signature(s) Signed: 04/08/2022 12:51:31 PM By: Andrea Yu Entered By: Andrea Yu on 04/08/2022 11:33:32 -------------------------------------------------------------------------------- Wound Assessment Details Patient Name: Date of Service: Andrea Yu 04/08/2022 11:15 [name] Medical Record Number: [id number] Patient Account Number: [id number] Date of Birth/Sex: Treating RN: [birth year]-Jul-Yu (82 y.o. F) Primary Care Lourene Hoston: Andrea Yu Other Clinician: Referring Cheryn Lundquist: Treating Karel Mowers/Extender: Andrea Yu in Treatment: 19 Wound Status Wound Number: 4 Primary Pressure Ulcer Etiology: Wound Location: Left, Lateral Ankle Wound Open Wounding Event: Pressure Injury Status: Date Acquired: 12/23/2021 Comorbid Cataracts, Arrhythmia, Hypertension, Osteoarthritis, Weeks Of Treatment: 13 History: Osteomyelitis, Neuropathy, Confinement Anxiety Clustered Wound: No Photos Wound Measurements Length: (cm) 0.9 Width: (cm) 0.8 Depth: (cm) 0.2 Area: (cm) 0.565 Volume: (cm) 0.113 % Reduction in Area: -300.7% % Reduction in Volume: -707.1% Epithelialization: Small (1-33%) Wound Description Classification: Category/Stage II Wound Margin: Distinct, outline attached Exudate Amount: Medium Exudate Type: Serosanguineous Exudate Color: red, Yu Foul Odor After Cleansing: No Slough/Fibrino Yes Wound Bed Granulation Amount: Medium (34-66%) Exposed Structure Granulation Quality: Pink Fascia Exposed: No Necrotic Amount: Medium (34-66%) Fat Layer (Subcutaneous Tissue) Exposed: Yes Necrotic Quality: Adherent Slough Tendon Exposed: No Muscle Exposed: No Joint Exposed: No Bone Exposed: No Andrea Yu, Andrea Yu ([id number]) [phone].pdf Page 11 of 11 Periwound Skin Texture Texture Color No  Abnormalities Noted: Yes No Abnormalities Noted: No Rubor: Yes Moisture No Abnormalities Noted: Yes Temperature / Pain Temperature: No Abnormality Treatment Notes Wound #4 (Ankle) Wound Laterality: Left, Lateral  Cleanser Normal Saline Discharge Instruction: Cleanse the wound with Normal Saline prior to applying Yu clean dressing using gauze sponges, not tissue or cotton balls. Byram Ancillary Kit - 15 Day Supply Discharge Instruction: Use supplies as instructed; Kit contains: (15) Saline Bullets; (15) 3x3 Gauze; 15 pr Gloves Peri-Wound Care Skin Prep Discharge Instruction: Use skin prep as directed Topical Primary Dressing Hydrofera Blue Ready Transfer Foam, 2.5x2.5 (in/in) Discharge Instruction: Apply directly to wound bed as directed Secondary Dressing Zetuvit Plus Silicone Border Dressing 4x4 (in/in) Discharge Instruction: Apply silicone border over primary dressing as directed. Secured With Compression Wrap Compression Stockings Environmental education officer) Signed: 04/08/2022 12:51:31 PM By: Andrea Yu Entered By: Andrea Yu on 04/08/2022 11:35:15 -------------------------------------------------------------------------------- Vitals Details Patient Name: Date of Service: Andrea Yu. 04/08/2022 11:15 [name] Medical Record Number: [id number] Patient Account Number: [id number] Date of Birth/Sex: Treating RN: Yu-11[birth year]11 (82 y.o. F) Primary Care Josue Falconi: Andrea Yu Other Clinician: Referring Damaris Abeln: Treating Trysten Berti/Extender: Andrea Yu in Treatment: 19 Vital Signs Time Taken: 11:19 Temperature (F): 97.8 Height (in): 59 Pulse (bpm): 91 Weight (lbs): 155 Respiratory Rate (breaths/min): 20 Body Mass Index (BMI): 31.3 Blood Pressure (mmHg): 117/71 Reference Range: 80 - 120 mg / dl Electronic Signature(s) Signed: 04/08/2022 12:51:31 PM By: Andrea Yu Entered By: Andrea Yu on 04/08/2022 11:19:23

## 2022-04-12 NOTE — Telephone Encounter (Signed)
See 04/07/22 telephone note

## 2022-04-23 ENCOUNTER — Ambulatory Visit (HOSPITAL_BASED_OUTPATIENT_CLINIC_OR_DEPARTMENT_OTHER): Payer: Medicare Other | Admitting: Internal Medicine

## 2022-04-29 ENCOUNTER — Encounter (HOSPITAL_BASED_OUTPATIENT_CLINIC_OR_DEPARTMENT_OTHER): Payer: Medicare Other | Attending: Internal Medicine | Admitting: General Surgery

## 2022-04-29 DIAGNOSIS — L89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, stage 3: Secondary | ICD-10-CM | POA: Insufficient documentation

## 2022-04-29 DIAGNOSIS — L98415 Non-pressure chronic ulcer of buttock with muscle involvement without evidence of necrosis: Secondary | ICD-10-CM | POA: Diagnosis not present

## 2022-04-29 DIAGNOSIS — E2740 Unspecified adrenocortical insufficiency: Secondary | ICD-10-CM | POA: Diagnosis not present

## 2022-04-29 DIAGNOSIS — Z7952 Long term (current) use of systemic steroids: Secondary | ICD-10-CM | POA: Diagnosis not present

## 2022-04-29 DIAGNOSIS — I10 Essential (primary) hypertension: Secondary | ICD-10-CM | POA: Insufficient documentation

## 2022-04-29 DIAGNOSIS — L89522 Pressure ulcer of left ankle, stage 2: Secondary | ICD-10-CM | POA: Insufficient documentation

## 2022-04-29 DIAGNOSIS — L0231 Cutaneous abscess of buttock: Secondary | ICD-10-CM | POA: Diagnosis not present

## 2022-04-30 NOTE — Progress Notes (Signed)
JALEA, GASPAR Yu ([id number]) [id number] Page 1 of 10 Visit Report for 04/29/2022 Arrival Information Details Patient Name: Date of Service: Andrea Yu 04/29/2022 12:30 PM Medical Record Number: [id number] Patient Account Number: [id number] Date of Birth/Sex: Treating RN: Jun 22, 1940 (82 y.o. [name] Primary Care Jaedin Trumbo: Jarome Matin Other Clinician: Referring Loni Abdon: Treating Fishel Wamble/Extender: Marena Chancy in Treatment: 22 Visit Information History Since Last Visit Added or deleted any medications: No Patient Arrived: Wheel Chair Any new allergies or adverse reactions: No Arrival Time: 12:45 Had Yu fall or experienced change in No Accompanied By: daughter and caregiver activities of daily living that may affect Transfer Assistance: Manual risk of falls: Patient Identification Verified: Yes Signs or symptoms of abuse/neglect since last visito No Secondary Verification Process Completed: Yes Hospitalized since last visit: No Patient Requires Transmission-Based Precautions: No Implantable device outside of the clinic excluding No Patient Has Alerts: No cellular tissue based products placed in the center since last visit: Has Dressing in Place as Prescribed: Yes Pain Present Now: No Electronic Signature(s) Signed: 04/29/2022 5:39:06 PM By: Zenaida Deed RN, BSN Entered By: Zenaida Deed on 04/29/2022 12:45:46 -------------------------------------------------------------------------------- Encounter Discharge Information Details Patient Name: Date of Service: Andrea Yu. 04/29/2022 12:30 PM Medical Record Number: [id number] Patient Account Number: [id number] Date of Birth/Sex: Treating RN: [birth year] (81 y.o. [name] Primary Care Tyshay Adee: Jarome Matin Other Clinician: Referring Renea Schoonmaker: Treating Eureka Valdes/Extender: Marena Chancy in Treatment: 22 Encounter  Discharge Information Items Post Procedure Vitals Discharge Condition: Stable Temperature (F): 98.3 Ambulatory Status: Wheelchair Pulse (bpm): 84 Discharge Destination: Home Respiratory Rate (breaths/min): 18 Transportation: Private Auto Blood Pressure (mmHg): 147/83 Accompanied By: daughter/caregiver Schedule Follow-up Appointment: Yes Clinical Summary of Care: Patient Declined Electronic Signature(s) Signed: 04/29/2022 5:39:06 PM By: Zenaida Deed RN, BSN Entered By: Zenaida Deed on 04/29/2022 14:02:08 -------------------------------------------------------------------------------- Lower Extremity Assessment Details Patient Name: Date of Service: Andrea Yu 04/29/2022 12:30 PM Medical Record Number: [id number] Patient Account Number: [id number] Date of Birth/Sex: Treating RN: Jan 23, 1941 (82 y.o. [name] Primary Care Yachet Mattson: Jarome Matin Other Clinician: Referring Festus Pursel: Treating Hezikiah Retzloff/Extender: Marena Chancy in Treatment: 22 Edema Assessment Assessed: Andrea Yu: No] Franne Forts: No] T[LeftTKEYAH, RAZZAQ ([id number])] [Right: [id number] Page 2 of 10] Edema: [Left: No] [Right: No] Calf Left: Right: Point of Measurement: From Medial Instep 31.5 cm 32 cm Ankle Left: Right: Point of Measurement: From Medial Instep 17.8 cm 20.4 cm Vascular Assessment Pulses: Dorsalis Pedis Palpable: [Left:Yes] [Right:Yes] Electronic Signature(s) Signed: 04/29/2022 5:39:06 PM By: Zenaida Deed RN, BSN Entered By: Zenaida Deed on 04/29/2022 12:49:39 -------------------------------------------------------------------------------- Multi Wound Chart Details Patient Name: Date of Service: Andrea Lain Yu. 04/29/2022 12:30 PM Medical Record Number: [id number] Patient Account Number: [id number] Date of Birth/Sex: Treating RN: 17-Jun-1940 (81 y.o. F) Primary Care Kandra Graven: Jarome Matin Other Clinician: Referring  Avalina Benko: Treating Borden Thune/Extender: Marena Chancy in Treatment: 22 Vital Signs Height(in): 59 Pulse(bpm): 84 Weight(lbs): 155 Blood Pressure(mmHg): 147/83 Body Mass Index(BMI): 31.3 Temperature(F): 98.3 Respiratory Rate(breaths/min): 18 [1:Photos:] Right, Medial Malleolus Right Gluteus Left, Lateral Ankle Wound Location: Pressure Injury Bump Pressure Injury Wounding Event: Pressure Ulcer Abscess Pressure Ulcer Primary Etiology: Cataracts, Arrhythmia, Hypertension, Cataracts, Arrhythmia, Hypertension, Cataracts, Arrhythmia, Hypertension, Comorbid History: Osteoarthritis, Osteomyelitis, Osteoarthritis, Osteomyelitis, Osteoarthritis, Osteomyelitis, Neuropathy, Confinement Anxiety Neuropathy, Confinement Anxiety Neuropathy, Confinement Anxiety 11/16/2021 09/28/2021 12/23/2021 Date Acquired: 22 22 16  Weeks of Treatment: Open Open Open Wound Status: No No No Wound Recurrence: 1.3x1.4x0.2 0.5x2.5x4.9  1.3x1.1x0.3 Measurements L x W x D (cm) 1.429 0.982 1.123 Yu (cm) : rea 0.286 4.811 0.337 Volume (cm) : 7.10% 90.60% -696.50% % Reduction in Yu rea: -85.70% 84.60% -2307.10% % Reduction in Volume: 12 Starting Position 1 (o'clock): 4 Ending Position 1 (o'clock): 4.8 Maximum Distance 1 (cm): No Yes No Undermining: Category/Stage III Full Thickness With Exposed Support Category/Stage II Classification: Structures Medium Medium Medium Exudate Amount: Serosanguineous Serous Serosanguineous Exudate Type: red, brown amber red, brown Exudate Color: Distinct, outline attached Well defined, not attached Distinct, outline attached Wound MarginEVELETTE, Andrea Yu ([id number]) [id number] Page 3 of 10 Small (1-33%) Large (67-100%) None Present (0%) Granulation Amount: Pink Pink N/Yu Granulation Quality: Large (67-100%) Small (1-33%) Large (67-100%) Necrotic Amount: Fat Layer (Subcutaneous Tissue): Yes Fat Layer (Subcutaneous  Tissue): Yes Fat Layer (Subcutaneous Tissue): Yes Exposed Structures: Fascia: No Fascia: No Fascia: No Tendon: No Tendon: No Tendon: No Muscle: No Muscle: No Muscle: No Joint: No Joint: No Joint: No Bone: No Bone: No Bone: No None Small (1-33%) Small (1-33%) Epithelialization: Debridement - Excisional N/Yu Debridement - Excisional Debridement: Pre-procedure Verification/Time Out 13:30 N/Yu 13:30 Taken: Lidocaine 4% Topical Solution N/Yu Lidocaine 4% Topical Solution Pain Control: Subcutaneous, Slough N/Yu Subcutaneous, Slough Tissue Debrided: Skin/Subcutaneous Tissue N/Yu Skin/Subcutaneous Tissue Level: 1.82 N/Yu 1.43 Debridement Yu (sq cm): rea Curette N/Yu Curette Instrument: Minimum N/Yu Minimum Bleeding: Pressure N/Yu Pressure Hemostasis Yu chieved: 0 N/Yu 0 Procedural Pain: 0 N/Yu 0 Post Procedural Pain: Procedure was tolerated well N/Yu Procedure was tolerated well Debridement Treatment Response: 1.3x1.4x0.2 N/Yu 1.3x1.1x0.3 Post Debridement Measurements L x W x D (cm) 0.286 N/Yu 0.337 Post Debridement Volume: (cm) Category/Stage III N/Yu Category/Stage II Post Debridement Stage: No Abnormalities Noted No Abnormalities Noted No Abnormalities Noted Periwound Skin Texture: No Abnormalities Noted No Abnormalities Noted No Abnormalities Noted Periwound Skin Moisture: Erythema: Yes Erythema: No Rubor: Yes Periwound Skin Color: Measured: 1cm N/Yu N/Yu Erythema Measurement: No Abnormality No Abnormality No Abnormality Temperature: N/Yu Yes N/Yu Tenderness on Palpation: Debridement N/Yu Debridement Procedures Performed: Treatment Notes Electronic Signature(s) Signed: 04/29/2022 1:53:57 PM By: Duanne Guess MD FACS Entered By: Duanne Guess on 04/29/2022 13:53:57 -------------------------------------------------------------------------------- Multi-Disciplinary Care Plan Details Patient Name: Date of Service: Andrea Lain Yu. 04/29/2022 12:30 PM Medical Record  Number: [id number] Patient Account Number: [id number] Date of Birth/Sex: Treating RN: [birth year] (81 y.o. [name] Primary Care Aubrianne Molyneux: Jarome Matin Other Clinician: Referring Eldean Nanna: Treating Liban Guedes/Extender: Marena Chancy in Treatment: 22 Multidisciplinary Care Plan reviewed with physician Active Inactive Pressure Nursing Diagnoses: Knowledge deficit related to causes and risk factors for pressure ulcer development Knowledge deficit related to management of pressures ulcers Potential for impaired tissue integrity related to pressure, friction, moisture, and shear Goals: Patient/caregiver will verbalize understanding of pressure ulcer management Date Initiated: 11/24/2021 Target Resolution Date: 05/25/2022 Goal Status: Active Interventions: Assess: immobility, friction, shearing, incontinence upon admission and as needed Assess offloading mechanisms upon admission and as needed Assess potential for pressure ulcer upon admission and as needed Provide education on pressure ulcers Treatment Activities: Pressure reduction/relief device ordered : 11/24/2021 Andrea Yu, Andrea Yu ([id number]) [id number].pdf Page 4 of 10 Notes: Soft Tissue Infection Nursing Diagnoses: Impaired tissue integrity Potential for infection: soft tissue Goals: Patient's soft tissue infection will resolve Date Initiated: 11/24/2021 Target Resolution Date: 05/25/2022 Goal Status: Active Signs and symptoms of infection will be recognized early to allow for prompt treatment Date Initiated: 11/24/2021 Date Inactivated: 01/01/2022 Target Resolution Date: 12/22/2021 Goal Status: Met Interventions: Assess signs and  symptoms of infection every visit Provide education on infection Treatment Activities: Culture : 11/24/2021 Culture and sensitivity : 11/24/2021 Education provided on Infection : 12/04/2021 Systemic antibiotics :  11/24/2021 Notes: Wound/Skin Impairment Nursing Diagnoses: Impaired tissue integrity Knowledge deficit related to ulceration/compromised skin integrity Goals: Patient/caregiver will verbalize understanding of skin care regimen Date Initiated: 11/24/2021 Target Resolution Date: 05/25/2022 Goal Status: Active Ulcer/skin breakdown will have Yu volume reduction of 30% by week 4 Date Initiated: 11/24/2021 Target Resolution Date: 05/25/2022 Goal Status: Active Interventions: Assess patient/caregiver ability to obtain necessary supplies Assess patient/caregiver ability to perform ulcer/skin care regimen upon admission and as needed Assess ulceration(s) every visit Provide education on ulcer and skin care Treatment Activities: Skin care regimen initiated : 11/24/2021 Topical wound management initiated : 11/24/2021 Notes: Electronic Signature(s) Signed: 04/29/2022 5:39:06 PM By: Zenaida Deed RN, BSN Entered By: Zenaida Deed on 04/29/2022 13:02:11 -------------------------------------------------------------------------------- Pain Assessment Details Patient Name: Date of Service: Andrea Lain Yu. 04/29/2022 12:30 PM Medical Record Number: [id number] Patient Account Number: [id number] Date of Birth/Sex: Treating RN: 1940-07-30 (82 y.o. [name] Primary Care Jerrett Baldinger: Jarome Matin Other Clinician: Referring Nickcole Bralley: Treating Dorianne Perret/Extender: Marena Chancy in Treatment: 22 Active Problems Location of Pain Severity and Description of Pain Patient Has Paino No Andrea Yu, Andrea Yu ([id number]) [id number] Page 5 of 10 Patient Has Paino No Site Locations Rate the pain. Current Pain Level: 0 Pain Management and Medication Current Pain Management: Electronic Signature(s) Signed: 04/29/2022 5:39:06 PM By: Zenaida Deed RN, BSN Entered By: Zenaida Deed on 04/29/2022  12:46:16 -------------------------------------------------------------------------------- Patient/Caregiver Education Details Patient Name: Date of Service: Andrea Yu 4/4/2024andnbsp12:30 PM Medical Record Number: [id number] Patient Account Number: [id number] Date of Birth/Gender: Treating RN: 1940/11/24 (81 y.o. [name] Primary Care Physician: Jarome Matin Other Clinician: Referring Physician: Treating Physician/Extender: Marena Chancy in Treatment: 22 Education Assessment Education Provided To: Patient Education Topics Provided Pressure: Methods: Explain/Verbal Responses: Reinforcements needed, State content correctly Wound/Skin Impairment: Methods: Explain/Verbal Responses: Reinforcements needed, State content correctly Electronic Signature(s) Signed: 04/29/2022 5:39:06 PM By: Zenaida Deed RN, BSN Entered By: Zenaida Deed on 04/29/2022 13:02:45 -------------------------------------------------------------------------------- Wound Assessment Details Patient Name: Date of Service: Andrea Lain Yu. 04/29/2022 12:30 PM Medical Record Number: [id number] Patient Account Number: [id number] Date of Birth/Sex: Treating RN: 02/16/1940 (81 y.o. [name] Primary Care Camp Gopal: Jarome Matin Other Clinician: Referring Florabelle Cardin: Treating Kiosha Buchan/Extender: Marena Chancy in Treatment: [address] Wound Status Andrea Yu, Andrea Yu ([id number]) [id number] Page 6 of 10 Wound Number: 1 Primary Pressure Ulcer Etiology: Wound Location: Right, Medial Malleolus Wound Open Wounding Event: Pressure Injury Status: Date Acquired: 11/16/2021 Comorbid Cataracts, Arrhythmia, Hypertension, Osteoarthritis, Weeks Of Treatment: 22 History: Osteomyelitis, Neuropathy, Confinement Anxiety Clustered Wound: No Photos Wound Measurements Length: (cm) 1.3 Width: (cm) 1.4 Depth: (cm) 0.2 Area: (cm)  1.429 Volume: (cm) 0.286 % Reduction in Area: 7.1% % Reduction in Volume: -85.7% Epithelialization: None Tunneling: No Undermining: No Wound Description Classification: Category/Stage III Wound Margin: Distinct, outline attached Exudate Amount: Medium Exudate Type: Serosanguineous Exudate Color: red, brown Foul Odor After Cleansing: No Slough/Fibrino Yes Wound Bed Granulation Amount: Small (1-33%) Exposed Structure Granulation Quality: Pink Fascia Exposed: No Necrotic Amount: Large (67-100%) Fat Layer (Subcutaneous Tissue) Exposed: Yes Necrotic Quality: Adherent Slough Tendon Exposed: No Muscle Exposed: No Joint Exposed: No Bone Exposed: No Periwound Skin Texture Texture Color No Abnormalities Noted: Yes No Abnormalities Noted: No Erythema: Yes Moisture Erythema Measurement: Measured No Abnormalities Noted: Yes 1 cm  Temperature / Pain Temperature: No Abnormality Treatment Notes Wound #1 (Malleolus) Wound Laterality: Right, Medial Cleanser Normal Saline Discharge Instruction: Cleanse the wound with Normal Saline prior to applying Yu clean dressing using gauze sponges, not tissue or cotton balls. Byram Ancillary Kit - 15 Day Supply Discharge Instruction: Use supplies as instructed; Kit contains: (15) Saline Bullets; (15) 3x3 Gauze; 15 pr Gloves Peri-Wound Care Skin Prep Discharge Instruction: Use skin prep as directed Topical Primary Dressing Endoform 2x2 in Discharge Instruction: Moisten with saline Andrea Yu, Andrea Yu ([id number]) [id number] Page 7 of 10 Secondary Dressing Drawtex 4x4 in Discharge Instruction: Apply over primary dressing cut to fit inside wound edges Zetuvit Plus Silicone Border Dressing 4x4 (in/in) Discharge Instruction: Apply silicone border over primary dressing as directed. Secured With Compression Wrap Compression Stockings Facilities managerAdd-Ons Electronic Signature(s) Signed: 04/29/2022 5:39:06 PM By: Zenaida DeedBoehlein, Linda RN,  BSN Entered By: Zenaida DeedBoehlein, Linda on 04/29/2022 12:59:18 -------------------------------------------------------------------------------- Wound Assessment Details Patient Name: Date of Service: Andrea StabileA LBERT, KA [name] 04/29/2022 12:30 PM Medical Record Number: [id number] Patient Account Number: [id number] Date of Birth/Sex: Treating RN: 05-23-1940 (81 y.o. [name]) Boehlein, Linda Primary Care Fraya Ueda: Jarome MatinPaterson, Daniel Other Clinician: Referring Eleri Ruben: Treating Marguetta Windish/Extender: Marena Chancyannon, Jennifer Paterson, Daniel Weeks in Treatment: 22 Wound Status Wound Number: 2 Primary Abscess Etiology: Wound Location: Right Gluteus Wound Open Wounding Event: Bump Status: Date Acquired: 09/28/2021 Comorbid Cataracts, Arrhythmia, Hypertension, Osteoarthritis, Weeks Of Treatment: 22 History: Osteomyelitis, Neuropathy, Confinement Anxiety Clustered Wound: No Photos Wound Measurements Length: (cm) 0.5 Width: (cm) 2.5 Depth: (cm) 4.9 Area: (cm) 0.982 Volume: (cm) 4.811 % Reduction in Area: 90.6% % Reduction in Volume: 84.6% Epithelialization: Small (1-33%) Tunneling: No Undermining: Yes Starting Position (o'clock): 12 Ending Position (o'clock): 4 Maximum Distance: (cm) 4.8 Wound Description Classification: Full Thickness With Exposed Support Structures Wound Margin: Well defined, not attached Exudate Amount: Medium Exudate Type: Serous Exudate Color: amber Foul Odor After Cleansing: No Slough/Fibrino Yes Wound Bed Granulation Amount: Large (67-100%) Exposed Structure Granulation Quality: Pink Fascia Exposed: No Necrotic Amount: Small (1-33%) Fat Layer (Subcutaneous Tissue) Exposed: Yes Andrea Yu, Andrea Yu ([id number]) [id number] Page 8 of 10 Necrotic Quality: Adherent Slough Tendon Exposed: No Muscle Exposed: No Joint Exposed: No Bone Exposed: No Periwound Skin Texture Texture Color No Abnormalities Noted: Yes No Abnormalities Noted: Yes Moisture Temperature /  Pain No Abnormalities Noted: Yes Temperature: No Abnormality Tenderness on Palpation: Yes Treatment Notes Wound #2 (Gluteus) Wound Laterality: Right Cleanser Normal Saline Discharge Instruction: Cleanse the wound with Normal Saline prior to applying Yu clean dressing using gauze sponges, not tissue or cotton balls. Peri-Wound Care Skin Prep Discharge Instruction: Use skin prep as directed Topical Primary Dressing Dakin's Solution 0.25%, 16 (oz) Discharge Instruction: Moisten gauze with Dakin's solution Secondary Dressing Zetuvit Plus Silicone Border Dressing 5x5 (in/in) Discharge Instruction: Apply silicone border over primary dressing as directed. Secured With Compression Wrap Compression Stockings Facilities managerAdd-Ons Electronic Signature(s) Signed: 04/29/2022 5:39:06 PM By: Zenaida DeedBoehlein, Linda RN, BSN Entered By: Zenaida DeedBoehlein, Linda on 04/29/2022 13:00:30 -------------------------------------------------------------------------------- Wound Assessment Details Patient Name: Date of Service: Andrea StabileA LBERT, KA [name] 04/29/2022 12:30 PM Medical Record Number: [id number] Patient Account Number: [id number] Date of Birth/Sex: Treating RN: 05-23-1940 (81 y.o. [name]) Boehlein, Linda Primary Care Annette Liotta: Jarome MatinPaterson, Daniel Other Clinician: Referring Khanh Tanori: Treating Telford Archambeau/Extender: Marena Chancyannon, Jennifer Paterson, Daniel Weeks in Treatment: 22 Wound Status Wound Number: 4 Primary Pressure Ulcer Etiology: Wound Location: Left, Lateral Ankle Wound Open Wounding Event: Pressure Injury Status: Date Acquired: 12/23/2021 Comorbid Cataracts, Arrhythmia, Hypertension, Osteoarthritis, Weeks Of Treatment: 16 History: Osteomyelitis, Neuropathy, Confinement Anxiety  Clustered Wound: No Photos Andrea Yu, Andrea Yu ([id number]) [id number] Page 9 of 10 Wound Measurements Length: (cm) 1.3 Width: (cm) 1.1 Depth: (cm) 0.3 Area: (cm) 1.123 Volume: (cm) 0.337 % Reduction in Area: -696.5% % Reduction  in Volume: -2307.1% Epithelialization: Small (1-33%) Tunneling: No Undermining: No Wound Description Classification: Category/Stage II Wound Margin: Distinct, outline attached Exudate Amount: Medium Exudate Type: Serosanguineous Exudate Color: red, brown Foul Odor After Cleansing: No Slough/Fibrino Yes Wound Bed Granulation Amount: None Present (0%) Exposed Structure Necrotic Amount: Large (67-100%) Fascia Exposed: No Necrotic Quality: Adherent Slough Fat Layer (Subcutaneous Tissue) Exposed: Yes Tendon Exposed: No Muscle Exposed: No Joint Exposed: No Bone Exposed: No Periwound Skin Texture Texture Color No Abnormalities Noted: Yes No Abnormalities Noted: Yes Moisture Temperature / Pain No Abnormalities Noted: Yes Temperature: No Abnormality Treatment Notes Wound #4 (Ankle) Wound Laterality: Left, Lateral Cleanser Normal Saline Discharge Instruction: Cleanse the wound with Normal Saline prior to applying Yu clean dressing using gauze sponges, not tissue or cotton balls. Byram Ancillary Kit - 15 Day Supply Discharge Instruction: Use supplies as instructed; Kit contains: (15) Saline Bullets; (15) 3x3 Gauze; 15 pr Gloves Peri-Wound Care Skin Prep Discharge Instruction: Use skin prep as directed Topical Primary Dressing Endoform 2x2 in Discharge Instruction: Moisten with saline Secondary Dressing Drawtex 4x4 in Discharge Instruction: Apply over primary dressing cut to fit inside wound edges Zetuvit Plus Silicone Border Dressing 4x4 (in/in) Discharge Instruction: Apply silicone border over primary dressing as directed. Secured With Compression Wrap Compression Stockings Andrea Yu, Andrea Yu ([id number]) [id number] Page 10 of 10 Add-Ons Electronic Signature(s) Signed: 04/29/2022 5:39:06 PM By: Zenaida Deed RN, BSN Entered By: Zenaida Deed on 04/29/2022  13:01:02 -------------------------------------------------------------------------------- Vitals Details Patient Name: Date of Service: Andrea Yu Stacks [name] 04/29/2022 12:30 PM Medical Record Number: [id number] Patient Account Number: [id number] Date of Birth/Sex: Treating RN: Jul 28, 1940 (81 y.o. [name] Primary Care Javon Hupfer: Jarome Matin Other Clinician: Referring Murlene Revell: Treating Cheyann Blecha/Extender: Marena Chancy in Treatment: 22 Vital Signs Time Taken: 12:45 Temperature (F): 98.3 Height (in): 59 Pulse (bpm): 84 Weight (lbs): 155 Respiratory Rate (breaths/min): 18 Body Mass Index (BMI): 31.3 Blood Pressure (mmHg): 147/83 Reference Range: 80 - 120 mg / dl Electronic Signature(s) Signed: 04/29/2022 5:39:06 PM By: Zenaida Deed RN, BSN Entered By: Zenaida Deed on 04/29/2022 12:46:09

## 2022-04-30 NOTE — Progress Notes (Signed)
Andrea Yu, [name] ([id number]) [id number] Page 1 of 11 Visit Report for 04/29/2022 Chief Complaint Document Details Patient Name: Date of Service: Andrea Yu, [name] 04/29/2022 12:30 PM Medical Record Number: [id number] Patient Account Number: [id number] Date of Birth/Sex: Treating RN: Sep 23, 1940 (82 y.o. F) Primary Care Provider: Jarome MatinPaterson, Daniel Other Clinician: Referring Provider: Treating Provider/Extender: Marena Chancyannon, Jordyne Poehlman Paterson, Daniel Weeks in Treatment: 22 Information Obtained from: Patient Chief Complaint Patient is at the clinic for treatment of an open pressure ulcer on her right medial ankle, and a large abscess on her right buttock Electronic Signature(s) Signed: 04/29/2022 1:54:04 PM By: Duanne Guessannon, Fallon Howerter MD FACS Entered By: Duanne Guessannon, Shalita Notte on 04/29/2022 13:54:04 -------------------------------------------------------------------------------- Debridement Details Patient Name: Date of Service: Andrea Yu, [name] 04/29/2022 12:30 PM Medical Record Number: [id number] Patient Account Number: [id number] Date of Birth/Sex: Treating RN: Sep 23, 1940 (81 y.o. [name]) Boehlein, Linda Primary Care Provider: Jarome MatinPaterson, Daniel Other Clinician: Referring Provider: Treating Provider/Extender: Marena Chancyannon, Khrystyna Schwalm Paterson, Daniel Weeks in Treatment: 22 Debridement Performed for Assessment: Wound #1 Right,Medial Malleolus Performed By: Physician Duanne Guessannon, Dinara Lupu, MD Debridement Type: Debridement Level of Consciousness (Pre-procedure): Awake and Alert Pre-procedure Verification/Time Out Yes - 13:30 Taken: Start Time: 13:30 Pain Control: Lidocaine 4% T opical Solution T Area Debrided (L x W): otal 1.3 (cm) x 1.4 (cm) = 1.82 (cm) Tissue and other material debrided: Viable, Non-Viable, Slough, Subcutaneous, Slough Level: Skin/Subcutaneous Tissue Debridement Description: Excisional Instrument: Curette Bleeding: Minimum Hemostasis Achieved: Pressure Procedural Pain:  0 Post Procedural Pain: 0 Response to Treatment: Procedure was tolerated well Level of Consciousness (Post- Awake and Alert procedure): Post Debridement Measurements of Total Wound Length: (cm) 1.3 Stage: Category/Stage III Width: (cm) 1.4 Depth: (cm) 0.2 Volume: (cm) 0.286 Character of Wound/Ulcer Post Debridement: Improved Post Procedure Diagnosis Same as Pre-procedure Notes scribed for Dr. Lady Garyannon by Zenaida DeedLinda Boehlein, RN Electronic Signature(s) Signed: 04/29/2022 2:09:30 PM By: Duanne Guessannon, Malillany Kazlauskas MD FACS Signed: 04/29/2022 5:39:06 PM By: Zenaida DeedBoehlein, Linda RN, BSN Entered By: Zenaida DeedBoehlein, Linda on 04/29/2022 13:33:14 Andrea Yu, Breanne A ([id number]) [id number] Page 2 of 11 -------------------------------------------------------------------------------- Debridement Details Patient Name: Date of Service: Andrea Yu, [name] 04/29/2022 12:30 PM Medical Record Number: [id number] Patient Account Number: [id number] Date of Birth/Sex: Treating RN: Sep 23, 1940 (82 y.o. [name]) Boehlein, Linda Primary Care Provider: Jarome MatinPaterson, Daniel Other Clinician: Referring Provider: Treating Provider/Extender: Marena Chancyannon, Salman Wellen Paterson, Daniel Weeks in Treatment: 22 Debridement Performed for Assessment: Wound #4 Left,Lateral Ankle Performed By: Physician Duanne Guessannon, Rhian Funari, MD Debridement Type: Debridement Level of Consciousness (Pre-procedure): Awake and Alert Pre-procedure Verification/Time Out Yes - 13:30 Taken: Start Time: 13:30 Pain Control: Lidocaine 4% T opical Solution T Area Debrided (L x W): otal 1.3 (cm) x 1.1 (cm) = 1.43 (cm) Tissue and other material debrided: Viable, Non-Viable, Slough, Subcutaneous, Slough Level: Skin/Subcutaneous Tissue Debridement Description: Excisional Instrument: Curette Bleeding: Minimum Hemostasis Achieved: Pressure Procedural Pain: 0 Post Procedural Pain: 0 Response to Treatment: Procedure was tolerated well Level of Consciousness (Post- Awake and  Alert procedure): Post Debridement Measurements of Total Wound Length: (cm) 1.3 Stage: Category/Stage II Width: (cm) 1.1 Depth: (cm) 0.3 Volume: (cm) 0.337 Character of Wound/Ulcer Post Debridement: Improved Post Procedure Diagnosis Same as Pre-procedure Notes scribed for Dr. Lady Garyannon by Zenaida DeedLinda Boehlein, RN Electronic Signature(s) Signed: 04/29/2022 2:09:30 PM By: Duanne Guessannon, Kersti Scavone MD FACS Signed: 04/29/2022 5:39:06 PM By: Zenaida DeedBoehlein, Linda RN, BSN Entered By: Zenaida DeedBoehlein, Linda on 04/29/2022 13:34:17 -------------------------------------------------------------------------------- HPI Details Patient Name: Date of Service: Andrea Yu, [name] 04/29/2022 12:30 PM Medical Record Number: [id number] Patient Account Number: [id number] Date of  Birth/Sex: Treating RN: 08-30-1940 (82 y.o. 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 She is not diabetic and quit smoking over 40 years ago. 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 She has developed a pressure ulcer on the right medial malleolus. In addition, she apparently has had multiple cutaneous abscesses on her buttocks that have required repeated incision and drainage procedures. She has developed a large abscess on her right buttock that has become painful. She was recently treated with cephalexin by her PCP for urinary tract infection and also received a shot of Rocephin for the abscess, but it has not yet been incised or drained. She is nonambulatory but is able to stand to transfer. She does not wear any sort of Prevalon boot. ABI in clinic today was 0.96. On her right medial malleolus,  there is a circular ulcer. The surface is dry and fibrotic. There is some periwound erythema, but no malodor or purulent drainage. BRITTANNIE, TAWNEY A ([id number]) [id number] Page 3 of 11 On her right buttock, there is a large fluctuant bulge about the size of an orange. It is also erythematous and tender. 12/04/2021: The culture from her abscess grew out staph. A 10-day course of Bactrim was prescribed and she is currently taking this. Unfortunately, Dakin's solution was not delivered and only 2 x 2 gauze was sent, rather than Kerlix so the packing of the wound has been rather suboptimal. The wound cavity has light slough over all of the surfaces. Although covered, bone is palpable. The medial ankle wound looks about the same with a layer of slough accumulation. In addition, her daughter peeled some dry skin off of her foot this morning and she now has denuded areas over the majority of the plantar surface of her right foot. 01/01/2022: The patient has missed several visits and to her daughters report that it is somewhat difficult to get her here on a weekly basis. In the interim, she has developed a new pressure ulcer on her left lateral malleolus. The plantar surface of her right foot has healed. The cavity on her right buttock has contracted, but it remains quite deep and the trochanter, while not exposed, is palpable at the base. The pressure ulcer on her right medial malleolus is basically unchanged. 02/09/2022: All wounds are little bit smaller today. As the buttock abscess cavity has contracted, it has left some tunneling that has not been adequately packed. Light slough accumulation on both ankle wounds. 02/26/2022: All of the wounds continue to contract. There is slough on both ankle wounds. The buttock ulcer is clean. She brought her wound VAC with her today. 03/12/2022: The ankle wounds are about the same this week with slough accumulation. The buttock ulcer is smaller  and quite clean. She decided that she could not tolerate the discomfort of the wound VAC and removed it. They have been packing the wound with Dakin's moistened gauze. 03/25/2022: The ankle wounds are slightly smaller. Both have slough accumulation. The orifice of the buttock ulcer has gotten quite a bit smaller than the underlying cavity. She has decided that she would like to have the wound VAC back. 3/14; patient presents for follow-up. She has bilateral ankle wounds and has been using Hydrofera  Blue to the these wound beds. She has Prevalon boots to help with offloading. She also has a sacral wound that we have been using a wound VAC for. She has no issues or complaints today. 04/29/2022: She once again decided she did not like the wound VAC and so they have been packing her gluteal wound with Dakin's moistened gauze. The gluteal ulcer seems to be about the same. Her left lateral ankle wound has gotten deeper and larger. The surface tissue is gray with thick slough. No significant change to the right medial ankle wound. Electronic Signature(s) Signed: 04/29/2022 1:56:16 PM By: Duanne Guess MD FACS Entered By: Duanne Guess on 04/29/2022 13:56:16 -------------------------------------------------------------------------------- Physical Exam Details Patient Name: Date of Service: Andrea Yu 04/29/2022 12:30 PM Medical Record Number: [id number] Patient Account Number: [id number] Date of Birth/Sex: Treating RN: May 21, 1940 (81 y.o. F) Primary Care Provider: Jarome Matin Other Clinician: Referring Provider: Treating Provider/Extender: Marena Chancy in Treatment: 22 Constitutional Slightly hypertensive. . . . no acute distress. Respiratory Normal work of breathing on room air. Notes 04/29/2022: The gluteal ulcer seems to be about the same. Her left lateral ankle wound has gotten deeper and larger. The surface tissue is gray with thick slough. No significant  change to the right medial ankle wound. Electronic Signature(s) Signed: 04/29/2022 1:57:04 PM By: Duanne Guess MD FACS Entered By: Duanne Guess on 04/29/2022 13:57:04 -------------------------------------------------------------------------------- Physician Orders Details Patient Name: Date of Service: Andrea Yu 04/29/2022 12:30 PM Medical Record Number: [id number] Patient Account Number: [id number] Date of Birth/Sex: Treating RN: [birth year] (81 y.o. 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 - Dr. Lady Gary - Room 1 Return A Anesthetic (In clinic) Topical Lidocaine 4% applied to wound bed Bathing/ Shower/ Hygiene May shower and wash wound with soap and water. Negative Presssure Wound Therapy Discontinue wound vac. Call the number on the machine for the company to pick up. Off-Loading Multipodus Splint to: - prevalon boot to both feet Turn and reposition every 2 hours Home Health New wound care orders this week; continue Home Health for wound care. May utilize formulary equivalent dressing for wound treatment orders unless otherwise specified. Dressing changes to be completed by Home Health on Monday / Wednesday / Friday except when patient has scheduled visit at [facility]. Other Home Health Orders/Instructions: - [facility] Wound Treatment Wound #1 - Malleolus Wound Laterality: Right, Medial Cleanser: Normal Saline (Generic) Every Other Day/30 Days Discharge Instructions: Cleanse the wound with Normal Saline prior to applying a clean dressing using gauze sponges, not tissue or cotton balls. Cleanser: Byram Ancillary Kit - 15 Day Supply (Generic) Every Other Day/30 Days Discharge Instructions: Use supplies as instructed; Kit contains: (15) Saline Bullets; (15) 3x3 Gauze; 15 pr Gloves Peri-Wound Care: Skin Prep (DME) (Generic) Every Other Day/30 Days Discharge Instructions: Use skin prep as directed Prim Dressing: Endoform 2x2 in (DME) (Dispense As Written) Every Other Day/30 Days ary Discharge Instructions: Moisten with  saline Secondary Dressing: Drawtex 4x4 in (DME) (Dispense As Written) Every Other Day/30 Days Discharge Instructions: Apply over primary dressing cut to fit inside wound edges Secondary Dressing: Zetuvit Plus Silicone Border Dressing 4x4 (in/in) (Generic) Every Other Day/30 Days Discharge Instructions: Apply silicone border over primary dressing as directed. Wound #2 - Gluteus Wound Laterality: Right Cleanser: Normal Saline 1 x Per Day/30 Days Discharge Instructions: Cleanse the wound with Normal Saline prior to applying a clean dressing using gauze sponges, not tissue or cotton balls. Peri-Wound Care: Skin Prep (DME) (Generic) 1 x Per Day/30 Days Discharge Instructions: Use skin prep as directed Prim Dressing: Dakin's Solution 0.25%, 16 (oz) 1 x Per Day/30 Days ary Discharge Instructions: Moisten gauze with Dakin's solution Secondary Dressing: Zetuvit Plus Silicone Border Dressing 5x5 (in/in) (DME) (Dispense As Written) 1 x Per Day/30 Days Discharge Instructions: Apply silicone border over primary dressing as directed. Wound #4 - Ankle Wound Laterality: Left, Lateral Cleanser: Normal Saline (Generic) Every Other Day/30 Days Discharge  Instructions: Cleanse the wound with Normal Saline prior to applying a clean dressing using gauze sponges, not tissue or cotton balls. Cleanser: Byram Ancillary Kit - 15 Day Supply (Generic) Every Other Day/30 Days Discharge Instructions: Use supplies as instructed; Kit contains: (15) Saline Bullets; (15) 3x3 Gauze; 15 pr Gloves Peri-Wound Care: Skin Prep (Generic) Every Other Day/30 Days Discharge Instructions: Use skin prep as directed Prim Dressing: Endoform 2x2 in (DME) (Dispense As Written) Every Other Day/30 Days ary LATORIE, MONTESANO A ([id number]) [phone].pdf Page 5 of 11 Discharge Instructions: Moisten with saline Secondary Dressing: Drawtex 4x4 in (DME) (Dispense As Written) Every Other Day/30 Days Discharge Instructions: Apply over primary dressing cut to fit inside wound edges Secondary Dressing: Zetuvit Plus Silicone Border Dressing 4x4 (in/in) (Generic) Every Other Day/30 Days Discharge Instructions: Apply silicone border over primary dressing as directed. Electronic Signature(s) Signed: 04/29/2022 2:09:30 PM By: Duanne Guess MD FACS Entered By: Duanne Guess on 04/29/2022 13:57:18 -------------------------------------------------------------------------------- Problem List Details Patient Name: Date of Service: Andrea Yu 04/29/2022 12:30 PM Medical Record Number: [id number] Patient Account Number: [id number] Date of Birth/Sex: Treating RN: 1940/11/26 (81 y.o. 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 without 01/01/2022 No  Yes evidence of necrosis L02.31 Cutaneous abscess of buttock 11/24/2021 No Yes E27.40 Unspecified adrenocortical insufficiency 11/24/2021 No Yes Z79.52 Long term (current) use of systemic steroids 11/24/2021 No Yes I10 Essential (primary) hypertension 11/24/2021 No Yes Inactive Problems Resolved Problems ICD-10 Code Description Active Date Resolved Date L97.511 Non-pressure chronic ulcer of other part of right foot limited to breakdown of skin 12/04/2021 12/04/2021 Electronic Signature(s) Signed: 04/29/2022 1:53:47 PM By: Duanne Guess MD FACS Andrea Singleton A ([id number]) [id number] Page 6 of 11 Entered By: Duanne Guess on 04/29/2022 13:53:47 -------------------------------------------------------------------------------- Progress Note Details Patient Name: Date of Service: Andrea Yu 04/29/2022 12:30 PM Medical Record Number: [id number] Patient Account Number: [id number] Date of Birth/Sex: Treating RN: 05-04-1940 (82 y.o. 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 She is not diabetic and quit smoking over 40 years ago. 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 She has developed a pressure ulcer on the right medial malleolus. In addition, she apparently has had multiple cutaneous abscesses on her buttocks that have required repeated incision and drainage procedures. She has developed a large abscess on her  right buttock that has become painful. She was recently treated with cephalexin by her PCP for urinary tract infection and also received a shot of Rocephin for the abscess, but it has not yet been incised or drained. She is nonambulatory but is able to stand to transfer. She does not wear any sort of Prevalon boot. ABI in clinic today was 0.96. On her right medial malleolus, there is a circular ulcer. The surface is dry and fibrotic. There is some periwound erythema, but no malodor or purulent drainage. On her right buttock, there is a large fluctuant bulge about the size of an orange. It is also erythematous and tender. 12/04/2021: The culture from her abscess grew out staph. A 10-day course of Bactrim was prescribed and she is currently taking this. Unfortunately, Dakin's solution was not delivered and only 2 x 2 gauze was sent, rather than Kerlix so the packing of the wound has been rather suboptimal. The wound cavity has light slough over all of the surfaces. Although covered, bone is palpable. The medial ankle wound looks about the same with a layer of slough accumulation. In addition, her daughter peeled some dry skin off of her foot this morning and she now has denuded areas over the majority of the plantar surface of her right foot. 01/01/2022: The patient has missed several visits and to her daughters report that it is somewhat difficult to get her here on a weekly basis. In the interim, she has developed a new pressure ulcer on her left lateral malleolus. The plantar surface of her right foot has healed. The cavity on her right buttock has contracted, but it remains quite deep and the trochanter, while not exposed, is palpable at the base. The pressure ulcer on her right medial malleolus is basically unchanged. 02/09/2022: All wounds are little bit smaller today. As the buttock abscess cavity has contracted, it has left some tunneling that has not been adequately packed. Light slough  accumulation on both ankle wounds. 02/26/2022: All of the wounds continue to contract. There is slough on both ankle wounds. The buttock ulcer is clean. She brought her wound VAC with her today. 03/12/2022: The ankle wounds are about the same this week with slough accumulation. The buttock ulcer is smaller and quite clean. She decided that she could not tolerate the discomfort of the wound VAC and removed it. They have been packing the wound with Dakin's moistened gauze. 03/25/2022: The ankle wounds are slightly smaller. Both have slough accumulation. The orifice of the buttock ulcer has gotten quite a bit smaller than the underlying cavity. She has decided that she would like to have the wound VAC back. 3/14; patient presents for follow-up. She has bilateral ankle wounds and has been using Hydrofera Blue to the these wound beds. She has Prevalon boots to help with offloading. She also has a sacral wound that we have been using a  wound VAC for. She has no issues or complaints today. 04/29/2022: She once again decided she did not like the wound VAC and so they have been packing her gluteal wound with Dakin's moistened gauze. The gluteal ulcer seems to be about the same. Her left lateral ankle wound has gotten deeper and larger. The surface tissue is gray with thick slough. No significant change to the right medial ankle wound. Patient History Information obtained from Patient, Caregiver, Chart. Family History Cancer - Father,Maternal Grandparents,Siblings,Child, Heart Disease - Father, Hypertension - Father, Thyroid Problems - Mother, No family history of Diabetes, Hereditary Spherocytosis, Kidney Disease, Lung Disease, Seizures, Stroke, Tuberculosis. Social History Former smoker - quit 40 yr ago, Marital Status - Widowed, Alcohol Use - Never, Drug Use - No History, Caffeine Use - Daily - tea, soda. Medical History Eyes Patient has history of Cataracts - bil extracted Denies history of Glaucoma, Optic  Neuritis Cardiovascular Patient has history of Arrhythmia - afib, Hypertension Endocrine Denies history of Type I Diabetes, Type II Diabetes Genitourinary Denies history of End Stage Renal Disease Integumentary (Skin) Denies history of History of Burn Musculoskeletal Patient has history of Osteoarthritis, Osteomyelitis - pelvic TRISTY, UDOVICH A ([id number]) [id number] Page 7 of 11 Neurologic Patient has history of Neuropathy Oncologic Denies history of Received Chemotherapy, Received Radiation Psychiatric Patient has history of Confinement Anxiety Denies history of Anorexia/bulimia Hospitalization/Surgery History - TEE. - right ankle fusion. - bil knee replacements. - bil cataract extractions. - posterior lumbar fusion. - vaginal hysterectomy. - cholecystectomy. 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 no acute distress. Vitals Time Taken: 12:45 PM, Height: 59 in, Weight: 155 lbs, BMI: 31.3, Temperature: 98.3 F, Pulse: 84 bpm, Respiratory Rate: 18 breaths/min, Blood Pressure: 147/83 mmHg. Respiratory Normal work of breathing on room air. General Notes: 04/29/2022: The gluteal ulcer seems to be about the same. Her left lateral ankle wound has gotten deeper and larger. The surface tissue is gray with thick slough. No significant change to the right medial ankle wound. Integumentary (Hair, Skin) Wound #1 status is Open. Original cause of wound was Pressure Injury. The date acquired was: 11/16/2021. The wound has been in treatment 22 weeks. The wound is located on the Right,Medial Malleolus. The wound measures 1.3cm length x 1.4cm width x 0.2cm depth;  1.429cm^2 area and 0.286cm^3 volume. There is Fat Layer (Subcutaneous Tissue) exposed. There is no tunneling or undermining noted. There is a medium amount of serosanguineous drainage noted. The wound margin is distinct with the outline attached to the wound base. There is small (1-33%) pink granulation within the wound bed. There is a large (67-100%) amount of necrotic tissue within the wound bed including Adherent Slough. The periwound skin appearance had no abnormalities noted for texture. The periwound skin appearance had no abnormalities noted for moisture. The periwound skin appearance exhibited: Erythema. The surrounding wound skin color is noted with erythema. Erythema is measured at 1 cm. Periwound temperature was noted as No Abnormality. Wound #2 status is Open. Original cause of wound was Bump. The date acquired was: 09/28/2021. The wound has been in treatment 22 weeks. The wound is located on the Right Gluteus. The wound measures 0.5cm length x 2.5cm width x 4.9cm depth; 0.982cm^2 area and 4.811cm^3 volume. There is Fat Layer (Subcutaneous Tissue) exposed. There is no tunneling noted, however, there  is undermining starting at 12:00 and ending at 4:00 with a maximum distance of 4.8cm. There is a medium amount of serous drainage noted. The wound margin is well defined and not attached to the wound base. There is large (67-100%) pink granulation within the wound bed. There is a small (1-33%) amount of necrotic tissue within the wound bed including Adherent Slough. The periwound skin appearance had no abnormalities noted for texture. The periwound skin appearance had no abnormalities noted for moisture. The periwound skin appearance had no abnormalities noted for color. Periwound temperature was noted as No Abnormality. The periwound has tenderness on palpation. Wound #4 status is Open. Original cause of wound was Pressure Injury. The date acquired was: 12/23/2021. The wound has been in treatment  16 weeks. The wound is located on the Left,Lateral Ankle. The wound measures 1.3cm length x 1.1cm width x 0.3cm depth; 1.123cm^2 area and 0.337cm^3 volume. There is Fat Layer (Subcutaneous Tissue) exposed. There is no tunneling or undermining noted. There is a medium amount of serosanguineous drainage noted. The wound margin is distinct with the outline attached to the wound base. There is no granulation within the wound bed. There is a large (67-100%) amount of necrotic tissue within the wound bed including Adherent Slough. The periwound skin appearance had no abnormalities noted for texture. The periwound skin appearance had no abnormalities noted for moisture. The periwound skin appearance had no abnormalities noted for color. Periwound temperature was noted as No Abnormality. 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 There was a Excisional Skin/Subcutaneous Tissue Debridement with a total area of 1.82 sq cm performed by Duanne Guess, MD. With the following instrument(s): Curette to remove Viable and Non-Viable tissue/material. Material removed includes Subcutaneous Tissue and Slough and after achieving pain control using Lidocaine 4% T opical Solution. No specimens were taken. A time out was conducted at 13:30, prior to the start of the procedure. A Minimum amount of bleeding was controlled with Pressure. The procedure was tolerated well with a pain level of 0 throughout and a pain level of  0 following the procedure. Post Debridement Measurements: 1.3cm length x 1.4cm width x 0.2cm depth; 0.286cm^3 volume. Post debridement Stage noted as Category/Stage III. Character of Wound/Ulcer Post Debridement is improved. Post procedure Diagnosis Wound #1: Same as Pre-Procedure General Notes: scribed for Dr. Lady Gary by Zenaida Deed, RN. Wound #4 Pre-procedure diagnosis of Wound #4 is a Pressure Ulcer located on the Left,Lateral Ankle . There was a Excisional Skin/Subcutaneous Tissue Debridement with a total area of 1.43 sq cm performed by Duanne Guess, MD. With the following instrument(s): Curette to remove Viable and Non-Viable tissue/material. Material removed includes Subcutaneous Tissue and Slough and after achieving pain control using Lidocaine 4% T opical Solution. No specimens were taken. A time out was conducted at 13:30, prior to the start of the procedure. A Minimum amount of bleeding was controlled with Pressure. The procedure was tolerated well with a pain level of 0 throughout and a pain level of 0 following the procedure. Post Debridement Measurements: 1.3cm length x 1.1cm width x 0.3cm depth; 0.337cm^3 volume. Post debridement Stage  noted as Category/Stage II. Character of Wound/Ulcer Post Debridement is improved. Post procedure Diagnosis Wound #4: Same as Pre-Procedure General Notes: scribed for Dr. Lady Gary by Zenaida Deed, RN. Plan Follow-up Appointments: Return Appointment in 2 weeks. - Dr. Lady Gary - Room 1 Anesthetic: (In clinic) Topical Lidocaine 4% applied to wound bed Bathing/ Shower/ Hygiene: May shower and wash wound with soap and water. Negative Presssure Wound Therapy: Discontinue wound vac. Call the number on the machine for the company to pick up. Off-Loading: Multipodus Splint to: - prevalon boot to both feet Turn and reposition every 2 hours Home Health: New wound care orders this week; continue Home Health for wound care. May utilize formulary  equivalent dressing for wound treatment orders unless otherwise specified. Dressing changes to be completed by Home Health on Monday / Wednesday / Friday except when patient has scheduled visit at [facility]. Other Home Health Orders/Instructions: - [facility] WOUND #1: - Malleolus Wound Laterality: Right, Medial Cleanser: Normal Saline (Generic) Every Other Day/30 Days Discharge Instructions: Cleanse the wound with Normal Saline prior to applying a clean dressing using gauze sponges, not tissue or cotton balls. Cleanser: Byram Ancillary Kit - 15 Day Supply (Generic) Every Other Day/30 Days Discharge Instructions: Use supplies as instructed; Kit contains: (15) Saline Bullets; (15) 3x3 Gauze; 15 pr Gloves Peri-Wound Care: Skin Prep (DME) (Generic) Every Other Day/30 Days Discharge Instructions: Use skin prep as directed Prim Dressing: Endoform 2x2 in (DME) (Dispense As Written) Every Other Day/30 Days ary Discharge Instructions: Moisten with saline Secondary Dressing: Drawtex 4x4 in (DME) (Dispense As Written) Every Other Day/30 Days Discharge Instructions: Apply over primary dressing cut to fit inside wound edges Secondary Dressing: Zetuvit Plus Silicone Border Dressing 4x4 (in/in) (Generic) Every Other Day/30 Days Discharge Instructions: Apply silicone border over primary dressing as directed. WOUND #2: - Gluteus Wound Laterality: Right Cleanser: Normal Saline 1 x Per Day/30 Days Discharge Instructions: Cleanse the wound with Normal Saline prior to applying a clean dressing using gauze sponges, not tissue or cotton balls. Peri-Wound Care: Skin Prep (DME) (Generic) 1 x Per Day/30 Days Discharge Instructions: Use skin prep as directed Prim Dressing: Dakin's Solution 0.25%, 16 (oz) 1 x Per Day/30 Days ary Discharge Instructions: Moisten gauze with Dakin's solution Secondary Dressing: Zetuvit Plus Silicone Border Dressing 5x5 (in/in) (DME) (Dispense As Written) 1 x Per Day/30  Days Discharge Instructions: Apply silicone border over primary dressing as directed. WOUND #4: - Ankle Wound Laterality: Left, Lateral Cleanser: Normal Saline (Generic) Every Other Day/30 Days Discharge Instructions: Cleanse the wound with Normal Saline prior to applying a clean dressing using gauze sponges, not tissue or cotton balls. Cleanser: Byram Ancillary Kit - 15 Day Supply (Generic) Every Other Day/30 Days Discharge Instructions: Use supplies as instructed; Kit contains: (15) Saline Bullets; (15) 3x3 Gauze; 15 pr Gloves Peri-Wound Care: Skin Prep (Generic) Every Other Day/30 Days Discharge Instructions: Use skin prep as directed Prim Dressing: Endoform 2x2 in (DME) (Dispense As Written) Every Other Day/30 Days ary Discharge Instructions: Moisten with saline Secondary Dressing: Drawtex 4x4 in (DME) (Dispense As Written) Every Other Day/30 Days Discharge Instructions: Apply over primary dressing cut to fit inside wound edges Secondary Dressing: Zetuvit Plus Silicone Border Dressing 4x4 (in/in) (Generic) Every Other Day/30 Days Discharge Instructions: Apply silicone border over primary dressing as directed. MADDIX, KLIEWER A ([id number]) [id number] Page 9 of 11 04/29/2022: The gluteal ulcer seems to be about the same. Her left lateral ankle wound has gotten deeper and larger. The surface tissue is gray  with thick slough. No significant change to the right medial ankle wound. I used a curette to debride subcutaneous tissue and slough from both ankle wounds, to the point that I achieved bleeding tissue surfaces. I am going to try endoform to both of these to see if we can improve the tissue quality. We will use drawtex packing to fill the remaining space. We will continue to pack the gluteal ulcer with Dakin's-moistened gauze. Follow-up in 2 weeks. Electronic Signature(s) Signed: 04/29/2022 1:58:08 PM By: Duanne Guess MD FACS Entered By: Duanne Guess on  04/29/2022 13:58:07 -------------------------------------------------------------------------------- HxROS Details Patient Name: Date of Service: Andrea Stacks Y A. 04/29/2022 12:30 PM Medical Record Number: [id number] Patient Account Number: [id number] Date of Birth/Sex: Treating RN: 1940/08/03 (81 y.o. F) Primary Care Provider: Jarome Matin Other Clinician: Referring Provider: Treating Provider/Extender: Marena Chancy in Treatment: 22 Information Obtained From Patient Caregiver Chart Constitutional Symptoms (General Health) Medical History: Past Medical History Notes: morbid obesity Eyes Medical History: Positive for: Cataracts - bil extracted Negative for: Glaucoma; Optic Neuritis Ear/Nose/Mouth/Throat Medical History: Past Medical History Notes: hard of hearing Cardiovascular Medical History: Positive for: Arrhythmia - afib; Hypertension Gastrointestinal Medical History: Past Medical History Notes: GERD Endocrine Medical History: Negative for: Type I Diabetes; Type II Diabetes Past Medical History Notes: adrenal insufficiency Genitourinary Medical History: Negative for: End Stage Renal Disease Integumentary (Skin) Medical History: Negative for: History of Burn Musculoskeletal RHEN, DOSSANTOS A ([id number]) [phone].pdf Page 10 of 11 Medical History: Positive for: Osteoarthritis; Osteomyelitis - pelvic Past Medical History Notes: psoriatic arthritis, thoracic discitis, displaced spiral fx right tibia Neurologic Medical History: Positive for: Neuropathy Past Medical History Notes: hx [facility] Oncologic Medical History: Negative for: Received Chemotherapy; Received Radiation Psychiatric Medical History: Positive for: Confinement Anxiety Negative for: Anorexia/bulimia HBO Extended History Items Eyes: Cataracts Immunizations Pneumococcal Vaccine: Received Pneumococcal Vaccination: Yes Received Pneumococcal  Vaccination On or After 60th Birthday: Yes Implantable Devices None Hospitalization / Surgery History Type of Hospitalization/Surgery TEE right ankle fusion bil knee replacements bil cataract extractions posterior lumbar fusion vaginal hysterectomy cholecystectomy Family and Social History Cancer: Yes - Father,Maternal Grandparents,Siblings,Child; Diabetes: No; Heart Disease: Yes - Father; Hereditary Spherocytosis: No; Hypertension: Yes - Father; Kidney Disease: No; Lung Disease: No; Seizures: No; Stroke: No; Thyroid Problems: Yes - Mother; Tuberculosis: No; Former smoker - quit 40 yr ago; Marital Status - Widowed; Alcohol Use: Never; Drug Use: No History; Caffeine Use: Daily - tea, soda; Financial Concerns: No; Food, Clothing or Shelter Needs: No; Support System Lacking: No; Transportation Concerns: No Psychologist, prison and probation services) Signed: 04/29/2022 2:09:30 PM By: Duanne Guess MD FACS Entered By: Duanne Guess on 04/29/2022 13:56:26 -------------------------------------------------------------------------------- SuperBill Details Patient Name: Date of Service: Andrea Yu 04/29/2022 Medical Record Number: [id number] Patient Account Number: [id number] Date of Birth/Sex: Treating RN: 07/02/1940 (81 y.o. 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 L02.31 Cutaneous abscess of buttock E27.40 Unspecified adrenocortical insufficiency Z79.52 Long term (current) use of systemic steroids I10 Essential (primary) hypertension Facility  Procedures : CPT4 Code: [id number] Description: 11042 - DEB SUBQ TISSUE 20 SQ CM/< ICD-10 Diagnosis Description 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, stage 3 L89.522 Pressure ulcer of left  ankle, stage 2 Modifier: Quantity: 1 Physician Procedures : CPT4 Code Description Modifier [id number] - WC PHYS LEVEL 4 - EST PT 25 ICD-10 Diagnosis Description 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, stage 3 L89.522 Pressure ulcer of left ankle, stage 2 L98.415 Non-pressure chronic ulcer of buttock with  muscle involvement without evidence of necrosis Z79.52 Long term (current) use of systemic steroids Quantity: 1 : [id number] 11042 - WC PHYS SUBQ TISS 20 SQ CM ICD-10 Diagnosis Description 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, stage 3 L89.522 Pressure ulcer of left ankle, stage 2 Quantity: 1 Electronic Signature(s) Signed: 04/29/2022 1:58:28 PM By: Duanne Guess MD FACS Entered By: Duanne Guess on 04/29/2022 13:58:28

## 2022-05-13 ENCOUNTER — Encounter (HOSPITAL_BASED_OUTPATIENT_CLINIC_OR_DEPARTMENT_OTHER): Payer: Medicare Other | Admitting: General Surgery

## 2022-05-18 ENCOUNTER — Encounter (HOSPITAL_BASED_OUTPATIENT_CLINIC_OR_DEPARTMENT_OTHER): Payer: Medicare Other | Admitting: General Surgery

## 2022-05-18 DIAGNOSIS — L89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, stage 3: Secondary | ICD-10-CM | POA: Diagnosis not present

## 2022-05-19 NOTE — Progress Notes (Addendum)
DAMARIA, VACHON [name] ([id number]) [id number] Page 1 of 10 Visit Report for 05/18/2022 Arrival Information Details Patient Name: Date of Service: Andrea [name] 05/18/2022 2:00 PM Medical Record Number: [id number] Patient Account Number: [id number] Date of Birth/Sex: Treating RN: Mar 21, 1940 (82 y.o. F) Primary Care Sherle Mello: Jarome Matin Other Clinician: Referring Dakota Vanwart: Treating Karianne Nogueira/Extender: Marena Chancy in Treatment: 25 Visit Information History Since Last Visit All ordered tests and consults were completed: No Patient Arrived: Wheel Chair Added or deleted any medications: No Arrival Time: 13:44 Any new allergies or adverse reactions: No Accompanied By: daughter Had [name] fall or experienced change in No Transfer Assistance: None activities of daily living that may affect Patient Identification Verified: Yes risk of falls: Secondary Verification Process Completed: Yes Signs or symptoms of abuse/neglect since last visito No Patient Requires Transmission-Based Precautions: No Hospitalized since last visit: No Patient Has Alerts: No Implantable device outside of the clinic excluding No cellular tissue based products placed in the center since last visit: Has Dressing in Place as Prescribed: Yes Pain Present Now: No Electronic Signature(s) Signed: 05/18/2022 4:38:05 PM By: Zenaida Deed RN, BSN Entered By: Zenaida Deed on 05/18/2022 14:19:45 -------------------------------------------------------------------------------- Complex / Palliative Patient Assessment Details Patient Name: Date of Service: Andrea Lain [name]. 05/18/2022 2:00 PM Medical Record Number: [id number] Patient Account Number: [id number] Date of Birth/Sex: Treating RN: 07-May-1940 (82 y.o. Arta Silence Primary Care Sari Cogan: Jarome Matin Other Clinician: Referring Liane Tribbey: Treating Hanin Decook/Extender: Marena Chancy  in Treatment: 25 Complex Wound Management Criteria Patient has remarkable or complex co-morbidities requiring medications or treatments that extend wound healing times. Examples: Diabetes mellitus with chronic renal failure or end stage renal disease requiring dialysis Advanced or poorly controlled rheumatoid arthritis Diabetes mellitus and end stage chronic obstructive pulmonary disease Active cancer with current chemo- or radiation therapy [name]. fib, HTN, adrenal insufficiency, OA, osteomyelitis to pelvic region, psoriatic arthritis Palliative Wound Management Criteria Care Approach Wound Care Plan: Complex Wound Management Electronic Signature(s) Signed: 05/18/2022 5:12:13 PM By: Shawn Stall RN, BSN Signed: 05/19/2022 9:50:47 AM By: Duanne Guess MD FACS Entered By: Shawn Stall on 05/18/2022 17:12:12 -------------------------------------------------------------------------------- Encounter Discharge Information Details Patient Name: Date of Service: Andrea Lain [name]. 05/18/2022 2:00 PM Medical Record Number: [id number] Patient Account Number: [id number] Date of Birth/Sex: Treating RN: Feb 21, 1940 (81 y.o. Andrea [name] Primary Care Jatziry Wechter: Jarome Matin Other Clinician: Referring Wadell Craddock: Treating Areeba Sulser/Extender: Toriana, Sponsel ([id number]) [id number] Page 2 of 10 Weeks in Treatment: 25 Encounter Discharge Information Items Post Procedure Vitals Discharge Condition: Stable Temperature (F): 97.9 Ambulatory Status: Wheelchair Pulse (bpm): 77 Discharge Destination: Home Respiratory Rate (breaths/min): 18 Transportation: Private Auto Blood Pressure (mmHg): 153/80 Accompanied By: daughters Schedule Follow-up Appointment: Yes Clinical Summary of Care: Patient Declined Electronic Signature(s) Signed: 05/18/2022 4:38:05 PM By: Zenaida Deed RN, BSN Entered By: Zenaida Deed on 05/18/2022  15:11:20 -------------------------------------------------------------------------------- Lower Extremity Assessment Details Patient Name: Date of Service: Andrea Lain [name]. 05/18/2022 2:00 PM Medical Record Number: [id number] Patient Account Number: [id number] Date of Birth/Sex: Treating RN: 1940/10/31 (82 y.o. Andrea [name] Primary Care Treina Arscott: Jarome Matin Other Clinician: Referring Merion Caton: Treating Jakwon Gayton/Extender: Marena Chancy in Treatment: 25 Edema Assessment Assessed: [Left: No] [Right: No] Edema: [Left: No] [Right: No] Calf Left: Right: Point of Measurement: From Medial Instep 31.5 cm 32 cm Ankle Left: Right: Point of Measurement: From Medial Instep 17.8 cm 20.4 cm Vascular Assessment Pulses: Dorsalis Pedis Palpable: [Left:No] [  Right:No] Electronic Signature(s) Signed: 05/18/2022 4:38:05 PM By: Zenaida Deed RN, BSN Entered By: Zenaida Deed on 05/18/2022 14:21:33 -------------------------------------------------------------------------------- Multi Wound Chart Details Patient Name: Date of Service: Andrea [name] 05/18/2022 2:00 PM Medical Record Number: [id number] Patient Account Number: [id number] Date of Birth/Sex: Treating RN: [birth year] (81 y.o. F) Primary Care Ibraham Levi: Jarome Matin Other Clinician: Referring Thurley Francesconi: Treating Ledia Hanford/Extender: Marena Chancy in Treatment: 25 Vital Signs Height(in): 59 Pulse(bpm): 77 Weight(lbs): 155 Blood Pressure(mmHg): 153/80 Body Mass Index(BMI): 31.3 Temperature(F): 97.7 Respiratory Rate(breaths/min): 18 [[place], Andrea [name] ([id number]):Photos:] [[id number] Page 3 of 10:1 2 4] Right, Medial Malleolus Right Gluteus Left, Lateral Ankle Wound Location: Pressure Injury Bump Pressure Injury Wounding Event: Pressure Ulcer Abscess Pressure Ulcer Primary Etiology: Cataracts, Arrhythmia, Hypertension, Cataracts,  Arrhythmia, Hypertension, Cataracts, Arrhythmia, Hypertension, Comorbid History: Osteoarthritis, Osteomyelitis, Osteoarthritis, Osteomyelitis, Osteoarthritis, Osteomyelitis, Neuropathy, Confinement Anxiety Neuropathy, Confinement Anxiety Neuropathy, Confinement Anxiety 11/16/2021 09/28/2021 12/23/2021 Date Acquired: 25 25 19  Weeks of Treatment: Open Open Open Wound Status: No No No Wound Recurrence: 1.7x1.5x0.1 0.2x2.5x4.5 1x1x0.3 Measurements L x W x D (cm) 2.003 0.393 0.785 [name] (cm) : rea 0.2 1.767 0.236 Volume (cm) : -30.10% 96.20% -456.70% % Reduction in [name] rea: -29.90% 94.40% -1585.70% % Reduction in Volume: 12 6 Starting Position 1 (o'clock): 12 12 Ending Position 1 (o'clock): 4.3 0.4 Maximum Distance 1 (cm): No Yes Yes Undermining: Category/Stage III Full Thickness With Exposed Support Category/Stage III Classification: Structures Medium Medium Medium Exudate Amount: Serosanguineous Serous Serosanguineous Exudate Type: red, brown amber red, brown Exudate Color: No Yes No Foul Odor [name] Cleansing: fter N/[name] No N/[name] Odor Anticipated Due to Product Use: Distinct, outline attached Well defined, not attached Distinct, outline attached Wound Margin: Large (67-100%) Large (67-100%) Small (1-33%) Granulation [name] mount: Red, Pink Red, Pink Pink, Pale Granulation Quality: Small (1-33%) Small (1-33%) Large (67-100%) Necrotic Amount: Fat Layer (Subcutaneous Tissue): Yes Fat Layer (Subcutaneous Tissue): Yes Fat Layer (Subcutaneous Tissue): Yes Exposed Structures: Fascia: No Fascia: No Fascia: No Tendon: No Tendon: No Tendon: No Muscle: No Muscle: No Muscle: No Joint: No Joint: No Joint: No Bone: No Bone: No Bone: No None None Small (1-33%) Epithelialization: N/[name] N/[name] Debridement - Excisional Debridement: Pre-procedure Verification/Time Out N/[name] N/[name] 14:40 Taken: N/[name] N/[name] Lidocaine 4% Topical Solution Pain Control: N/[name] N/[name] Subcutaneous, Slough Tissue  Debrided: N/[name] N/[name] Skin/Subcutaneous Tissue Level: N/[name] N/[name] 0.78 Debridement [name] (sq cm): rea N/[name] N/[name] Curette Instrument: N/[name] N/[name] Minimum Bleeding: N/[name] N/[name] Pressure Hemostasis [name] chieved: N/[name] N/[name] 0 Procedural Pain: N/[name] N/[name] 0 Post Procedural Pain: N/[name] N/[name] Procedure was tolerated well Debridement Treatment Response: N/[name] N/[name] 1x1x0.3 Post Debridement Measurements L x W x D (cm) N/[name] N/[name] 0.236 Post Debridement Volume: (cm) N/[name] N/[name] Category/Stage III Post Debridement Stage: No Abnormalities Noted No Abnormalities Noted No Abnormalities Noted Periwound Skin Texture: No Abnormalities Noted No Abnormalities Noted No Abnormalities Noted Periwound Skin Moisture: Erythema: Yes Erythema: No Rubor: Yes Periwound Skin Color: Measured: 1cm N/[name] N/[name] Erythema Measurement: No Change N/[name] N/[name] Erythema Change: No Abnormality No Abnormality No Abnormality Temperature: N/[name] Yes N/[name] Tenderness on Palpation: N/[name] N/[name] Debridement Procedures Performed: Treatment Notes Electronic Signature(s) Signed: 05/18/2022 2:54:52 PM By: Duanne Guess MD FACS Entered By: Duanne Guess on 05/18/2022 14:54:52 Andrea Singleton [name] ([id number]) [id number] Page 4 of 10 -------------------------------------------------------------------------------- Multi-Disciplinary Care Plan Details Patient Name: Date of Service: Andrea [name] 05/18/2022 2:00 PM Medical Record Number: [id number] Patient Account Number: [id number] Date of Birth/Sex: Treating RN: January 03, 1941 (82 y.o. F) Zenaida Deed  Primary Care Adalena Abdulla: Jarome Matin Other Clinician: Referring Jayke Caul: Treating Mordechai Matuszak/Extender: Marena Chancy in Treatment: 25 Multidisciplinary Care Plan reviewed with physician Active Inactive Pressure Nursing Diagnoses: Knowledge deficit related to causes and risk factors for pressure ulcer development Knowledge deficit related to management of pressures  ulcers Potential for impaired tissue integrity related to pressure, friction, moisture, and shear Goals: Patient/caregiver will verbalize understanding of pressure ulcer management Date Initiated: 11/24/2021 Target Resolution Date: 05/25/2022 Goal Status: Active Interventions: Assess: immobility, friction, shearing, incontinence upon admission and as needed Assess offloading mechanisms upon admission and as needed Assess potential for pressure ulcer upon admission and as needed Provide education on pressure ulcers Treatment Activities: Pressure reduction/relief device ordered : 11/24/2021 Notes: Soft Tissue Infection Nursing Diagnoses: Impaired tissue integrity Potential for infection: soft tissue Goals: Patient's soft tissue infection will resolve Date Initiated: 11/24/2021 Target Resolution Date: 05/25/2022 Goal Status: Active Signs and symptoms of infection will be recognized early to allow for prompt treatment Date Initiated: 11/24/2021 Date Inactivated: 01/01/2022 Target Resolution Date: 12/22/2021 Goal Status: Met Interventions: Assess signs and symptoms of infection every visit Provide education on infection Treatment Activities: Culture : 11/24/2021 Culture and sensitivity : 11/24/2021 Education provided on Infection : 12/04/2021 Systemic antibiotics : 11/24/2021 Notes: Wound/Skin Impairment Nursing Diagnoses: Impaired tissue integrity Knowledge deficit related to ulceration/compromised skin integrity Goals: Patient/caregiver will verbalize understanding of skin care regimen Date Initiated: 11/24/2021 Target Resolution Date: 05/25/2022 Goal Status: Active Ulcer/skin breakdown will have [name] volume reduction of 30% by week 4 Andrea [name], Andrea [name] ([id number]) [id number] Page 5 of 10 Date Initiated: 11/24/2021 Target Resolution Date: 05/25/2022 Goal Status: Active Interventions: Assess patient/caregiver ability to obtain necessary supplies Assess  patient/caregiver ability to perform ulcer/skin care regimen upon admission and as needed Assess ulceration(s) every visit Provide education on ulcer and skin care Treatment Activities: Skin care regimen initiated : 11/24/2021 Topical wound management initiated : 11/24/2021 Notes: Electronic Signature(s) Signed: 05/18/2022 4:38:05 PM By: Zenaida Deed RN, BSN Entered By: Zenaida Deed on 05/18/2022 14:37:55 -------------------------------------------------------------------------------- Pain Assessment Details Patient Name: Date of Service: Andrea Lain [name]. 05/18/2022 2:00 PM Medical Record Number: [id number] Patient Account Number: [id number] Date of Birth/Sex: Treating RN: 1940-05-11 (82 y.o. F) Primary Care Marquis Down: Jarome Matin Other Clinician: Referring Lania Zawistowski: Treating Windell Musson/Extender: Marena Chancy in Treatment: 25 Active Problems Location of Pain Severity and Description of Pain Patient Has Paino No Site Locations With Dressing Change: No Rate the pain. Current Pain Level: 0 Pain Management and Medication Current Pain Management: Notes reports occaissional neuropathy and arthritis pain Electronic Signature(s) Signed: 05/18/2022 4:38:05 PM By: Zenaida Deed RN, BSN Entered By: Zenaida Deed on 05/18/2022 14:20:24 -------------------------------------------------------------------------------- Patient/Caregiver Education Details Patient Name: Date of Service: Andrea [name] 4/23/2024andnbsp2:00 PM Medical Record Number: [id number] Patient Account Number: [id number] Date of Birth/Gender: Treating RN: Jul 17, 1940 (81 y.o. Andrea [name] Primary Care Physician: Jarome Matin Other Clinician: [name] ([id number]) [id number] Page 6 of 10 Referring Physician: Treating Physician/Extender: Marena Chancy in Treatment: 25 Education Assessment Education Provided  To: Patient Education Topics Provided Pressure: Methods: Explain/Verbal Responses: Reinforcements needed, State content correctly Wound/Skin Impairment: Methods: Explain/Verbal Responses: Reinforcements needed, State content correctly Electronic Signature(s) Signed: 05/18/2022 4:38:05 PM By: Zenaida Deed RN, BSN Entered By: Zenaida Deed on 05/18/2022 14:38:37 -------------------------------------------------------------------------------- Wound Assessment Details Patient Name: Date of Service: Andrea Lain [name]. 05/18/2022 2:00 PM Medical Record Number: [id number] Patient Account Number: [id number] Date of Birth/Sex: Treating RN: 01-13-1941 (81  y.o. F) Primary Care Dakari Stabler: Jarome Matin Other Clinician: Referring Brittnie Lewey: Treating Lynden Flemmer/Extender: Marena Chancy in Treatment: 25 Wound Status Wound Number: 1 Primary Pressure Ulcer Etiology: Wound Location: Right, Medial Malleolus Wound Open Wounding Event: Pressure Injury Status: Date Acquired: 11/16/2021 Comorbid Cataracts, Arrhythmia, Hypertension, Osteoarthritis, Weeks Of Treatment: 25 History: Osteomyelitis, Neuropathy, Confinement Anxiety Clustered Wound: No Photos Wound Measurements Length: (cm) 1.7 Width: (cm) 1.5 Depth: (cm) 0.1 Area: (cm) 2.003 Volume: (cm) 0.2 % Reduction in Area: -30.1% % Reduction in Volume: -29.9% Epithelialization: None Tunneling: No Undermining: No Wound Description Classification: Category/Stage III Wound Margin: Distinct, outline attached Exudate Amount: Medium Exudate Type: Serosanguineous Exudate Color: red, brown Foul Odor After Cleansing: No Slough/Fibrino Yes Wound Bed Andrea [name], Andrea [name] ([id number]) [id number] Page 7 of 10 Granulation Amount: Large (67-100%) Exposed Structure Granulation Quality: Red, Pink Fascia Exposed: No Necrotic Amount: Small (1-33%) Fat Layer (Subcutaneous Tissue) Exposed: Yes Necrotic  Quality: Adherent Slough Tendon Exposed: No Muscle Exposed: No Joint Exposed: No Bone Exposed: No Periwound Skin Texture Texture Color No Abnormalities Noted: Yes No Abnormalities Noted: No Erythema: Yes Moisture Erythema Measurement: Measured No Abnormalities Noted: Yes 1 cm Erythema Change: No Change Temperature / Pain Temperature: No Abnormality Treatment Notes Wound #1 (Malleolus) Wound Laterality: Right, Medial Cleanser Normal Saline Discharge Instruction: Cleanse the wound with Normal Saline prior to applying [name] clean dressing using gauze sponges, not tissue or cotton balls. Byram Ancillary Kit - 15 Day Supply Discharge Instruction: Use supplies as instructed; Kit contains: (15) Saline Bullets; (15) 3x3 Gauze; 15 pr Gloves Peri-Wound Care Skin Prep Discharge Instruction: Use skin prep as directed Topical Primary Dressing Endoform 2x2 in Discharge Instruction: Moisten with saline Secondary Dressing Drawtex 4x4 in Discharge Instruction: Apply over primary dressing cut to fit inside wound edges Zetuvit Plus Silicone Border Dressing 4x4 (in/in) Discharge Instruction: Apply silicone border over primary dressing as directed. Secured With Compression Wrap Compression Stockings Facilities manager) Signed: 05/18/2022 4:38:05 PM By: Zenaida Deed RN, BSN Entered By: Zenaida Deed on 05/18/2022 14:24:07 -------------------------------------------------------------------------------- Wound Assessment Details Patient Name: Date of Service: Andrea Lain [name]. 05/18/2022 2:00 PM Medical Record Number: [id number] Patient Account Number: [id number] Date of Birth/Sex: Treating RN: April 09, 1940 (81 y.o. F) Primary Care Donny Heffern: Jarome Matin Other Clinician: Referring Lamark Schue: Treating Caylen Kuwahara/Extender: Marena Chancy in Treatment: 25 Wound Status Wound Number: 2 Primary Abscess Etiology: Wound Location: Right Gluteus Wound  Open Wounding Event: Bump Status: Date Acquired: 09/28/2021 Comorbid Cataracts, Arrhythmia, Hypertension, Osteoarthritis, Weeks Of Treatment: 25 History: Osteomyelitis, Neuropathy, Confinement Anxiety Andrea [name], Andrea [name] ([id number]) [id number] Page 8 of 10 History: Osteomyelitis, Neuropathy, Confinement Anxiety Clustered Wound: No Photos Wound Measurements Length: (cm) 0.2 Width: (cm) 2.5 Depth: (cm) 4.5 Area: (cm) 0.393 Volume: (cm) 1.767 % Reduction in Area: 96.2% % Reduction in Volume: 94.4% Epithelialization: None Tunneling: No Undermining: Yes Starting Position (o'clock): 12 Ending Position (o'clock): 12 Maximum Distance: (cm) 4.3 Wound Description Classification: Full Thickness With Exposed Support Structures Wound Margin: Well defined, not attached Exudate Amount: Medium Exudate Type: Serous Exudate Color: amber Foul Odor After Cleansing: Yes Due to Product Use: No Slough/Fibrino Yes Wound Bed Granulation Amount: Large (67-100%) Exposed Structure Granulation Quality: Red, Pink Fascia Exposed: No Necrotic Amount: Small (1-33%) Fat Layer (Subcutaneous Tissue) Exposed: Yes Necrotic Quality: Adherent Slough Tendon Exposed: No Muscle Exposed: No Joint Exposed: No Bone Exposed: No Periwound Skin Texture Texture Color No Abnormalities Noted: Yes No Abnormalities Noted: Yes Moisture Temperature / Pain No Abnormalities Noted: Yes Temperature: No  Abnormality Tenderness on Palpation: Yes Treatment Notes Wound #2 (Gluteus) Wound Laterality: Right Cleanser Normal Saline Discharge Instruction: Cleanse the wound with Normal Saline prior to applying [name] clean dressing using gauze sponges, not tissue or cotton balls. Peri-Wound Care Skin Prep Discharge Instruction: Use skin prep as directed Topical Primary Dressing Dakin's Solution 0.25%, 16 (oz) Discharge Instruction: Moisten gauze with Dakin's solution Secondary Dressing Zetuvit Plus  Silicone Border Dressing 5x5 (in/in) Discharge Instruction: Apply silicone border over primary dressing as directed. Secured With Andrea [name], Andrea [name] ([id number]) [id number] Page 9 of 10 Compression Wrap Compression Stockings Add-Ons Electronic Signature(s) Signed: 05/18/2022 4:38:05 PM By: Zenaida Deed RN, BSN Entered By: Zenaida Deed on 05/18/2022 14:33:12 -------------------------------------------------------------------------------- Wound Assessment Details Patient Name: Date of Service: Andrea [name] 05/18/2022 2:00 PM Medical Record Number: [id number] Patient Account Number: [id number] Date of Birth/Sex: Treating RN: Oct 31, 1940 (82 y.o. Andrea [name], Andrea [name] Primary Care Laurna Shetley: Jarome Matin Other Clinician: Referring Navea Woodrow: Treating Sanjna Haskew/Extender: Marena Chancy in Treatment: 25 Wound Status Wound Number: 4 Primary Pressure Ulcer Etiology: Wound Location: Left, Lateral Ankle Wound Open Wounding Event: Pressure Injury Status: Date Acquired: 12/23/2021 Comorbid Cataracts, Arrhythmia, Hypertension, Osteoarthritis, Weeks Of Treatment: 19 History: Osteomyelitis, Neuropathy, Confinement Anxiety Clustered Wound: No Photos Wound Measurements Length: (cm) 1 Width: (cm) 1 Depth: (cm) 0.3 Area: (cm) 0.785 Volume: (cm) 0.236 % Reduction in Area: -456.7% % Reduction in Volume: -1585.7% Epithelialization: Small (1-33%) Tunneling: No Undermining: Yes Starting Position (o'clock): 6 Ending Position (o'clock): 12 Maximum Distance: (cm) 0.4 Wound Description Classification: Category/Stage III Wound Margin: Distinct, outline attached Exudate Amount: Medium Exudate Type: Serosanguineous Exudate Color: red, brown Foul Odor After Cleansing: No Slough/Fibrino Yes Wound Bed Granulation Amount: Small (1-33%) Exposed Structure Granulation Quality: Pink, Pale Fascia Exposed: No Necrotic Amount: Large  (67-100%) Fat Layer (Subcutaneous Tissue) Exposed: Yes Tendon Exposed: No Muscle Exposed: No Joint Exposed: No Bone Exposed: No Periwound Skin Texture Texture Color No Abnormalities Noted: Yes No Abnormalities NotedKAYTELYN, Andrea [name] ([id number]) [id number] Page 10 of 10 No Abnormalities Noted: Yes No Abnormalities Noted: Yes Moisture Temperature / Pain No Abnormalities Noted: Yes Temperature: No Abnormality Treatment Notes Wound #4 (Ankle) Wound Laterality: Left, Lateral Cleanser Normal Saline Discharge Instruction: Cleanse the wound with Normal Saline prior to applying [name] clean dressing using gauze sponges, not tissue or cotton balls. Byram Ancillary Kit - 15 Day Supply Discharge Instruction: Use supplies as instructed; Kit contains: (15) Saline Bullets; (15) 3x3 Gauze; 15 pr Gloves Peri-Wound Care Skin Prep Discharge Instruction: Use skin prep as directed Topical Primary Dressing Endoform 2x2 in Discharge Instruction: Moisten with saline Secondary Dressing Drawtex 4x4 in Discharge Instruction: Apply over primary dressing cut to fit inside wound edges Zetuvit Plus Silicone Border Dressing 4x4 (in/in) Discharge Instruction: Apply silicone border over primary dressing as directed. Secured With Compression Wrap Compression Stockings Facilities manager) Signed: 05/18/2022 4:38:05 PM By: Zenaida Deed RN, BSN Entered By: Zenaida Deed on 05/18/2022 14:37:32 -------------------------------------------------------------------------------- Vitals Details Patient Name: Date of Service: Andrea Stacks [name] 05/18/2022 2:00 PM Medical Record Number: [id number] Patient Account Number: [id number] Date of Birth/Sex: Treating RN: 1940-05-26 (81 y.o. F) Primary Care Lacheryl Niesen: Jarome Matin Other Clinician: Referring Nevada Mullett: Treating Madalynne Gutmann/Extender: Marena Chancy in Treatment: 25 Vital Signs Time Taken:  01:45 Temperature (F): 97.7 Height (in): 59 Pulse (bpm): 77 Weight (lbs): 155 Respiratory Rate (breaths/min): 18 Body Mass Index (BMI): 31.3 Blood Pressure (mmHg): 153/80 Reference Range: 80 - 120 mg / dl Electronic Signature(s) Signed:  05/18/2022 4:38:05 PM By: Zenaida Deed RN, BSN Entered By: Zenaida Deed on 05/18/2022 14:19:51

## 2022-05-19 NOTE — Progress Notes (Signed)
AIRIEL, OBLINGER [name] ([id number]) [id number] Page 1 of 10 Visit Report for 05/18/2022 Chief Complaint Document Details Patient Name: Date of Service: Andrea [name] 05/18/2022 2:00 PM Medical Record Number: [id number] Patient Account Number: [id number] Date of Birth/Sex: Treating RN: 07-01-1940 (82 y.o. F) Primary Care Provider: Jarome Matin Other Clinician: Referring Provider: Treating Provider/Extender: Marena Chancy in Treatment: 25 Information Obtained from: Patient Chief Complaint Patient is at the clinic for treatment of an open pressure ulcer on her right medial ankle, and [name] large abscess on her right buttock Electronic Signature(s) Signed: 05/18/2022 2:54:58 PM By: Duanne Guess MD FACS Entered By: Duanne Guess on 05/18/2022 14:54:58 -------------------------------------------------------------------------------- Debridement Details Patient Name: Date of Service: Andrea [name]. 05/18/2022 2:00 PM Medical Record Number: [id number] Patient Account Number: [id number] Date of Birth/Sex: Treating RN: 01/28/1940 (82 y.o. [name] Primary Care Provider: Jarome Matin Other Clinician: Referring Provider: Treating Provider/Extender: Marena Chancy in Treatment: 25 Debridement Performed for Assessment: Wound #4 Left,Lateral Ankle Performed By: Physician Duanne Guess, MD Debridement Type: Debridement Level of Consciousness (Pre-procedure): Awake and Alert Pre-procedure Verification/Time Out Yes - 14:40 Taken: Start Time: 14:43 Pain Control: Lidocaine 4% T opical Solution Percent of Wound Bed Debrided: 100% T Area Debrided (cm): otal 0.78 Tissue and other material debrided: Viable, Non-Viable, Slough, Subcutaneous, Slough Level: Skin/Subcutaneous Tissue Debridement Description: Excisional Instrument: Curette Bleeding: Minimum Hemostasis Achieved: Pressure Procedural Pain:  0 Post Procedural Pain: 0 Response to Treatment: Procedure was tolerated well Level of Consciousness (Post- Awake and Alert procedure): Post Debridement Measurements of Total Wound Length: (cm) 1 Stage: Category/Stage III Width: (cm) 1 Depth: (cm) 0.3 Volume: (cm) 0.236 Character of Wound/Ulcer Post Debridement: Stable Post Procedure Diagnosis Same as Pre-procedure Notes scribed for Dr. Lady Gary by Zenaida Deed, RN Electronic Signature(s) Signed: 05/18/2022 3:37:18 PM By: Duanne Guess MD FACS Signed: 05/18/2022 4:38:05 PM By: Zenaida Deed RN, BSN Andrea [name] ([id number]) [id number] Page 2 of 10 Entered By: Zenaida Deed on 05/18/2022 14:47:38 -------------------------------------------------------------------------------- HPI Details Patient Name: Date of Service: Andrea [name] 05/18/2022 2:00 PM Medical Record Number: [id number] Patient Account Number: [id number] Date of Birth/Sex: Treating RN: July 16, 1940 (82 y.o. 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 She is not diabetic and quit smoking over 40 years ago. 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 She has developed [name] pressure ulcer on the right medial malleolus. In addition, she apparently has had multiple cutaneous abscesses on her buttocks that have required repeated incision and drainage procedures. She has developed [name] large abscess on her right buttock that has become painful. She was recently treated with cephalexin by her PCP for urinary tract infection and also received [name] shot of Rocephin for the abscess, but it has  not yet been incised or drained. She is nonambulatory but is able to stand to transfer. She does not wear any sort of Prevalon boot. ABI in clinic today was 0.96. On her right medial malleolus, there is [name] circular ulcer. The surface is dry and fibrotic. There is some periwound erythema, but no malodor or purulent drainage. On her right buttock, there is [name] large fluctuant bulge about the size of an orange. It  is also erythematous and tender. 12/04/2021: The culture from her abscess grew out staph. [name] 10-day course of Bactrim was prescribed and she is currently taking this. Unfortunately, Dakin's solution was not delivered and only 2 x 2 gauze was sent, rather than Kerlix so the packing of the wound has been rather suboptimal. The wound cavity has light slough over all of the surfaces. Although covered, bone is palpable. The medial ankle wound looks about the same with [name] layer of slough accumulation. In addition, her daughter peeled some dry skin off of her foot this morning and she now has denuded areas over the majority of the plantar surface of her right foot. 01/01/2022: The patient has missed several visits and to her daughters report that it is somewhat difficult to get her here on [name] weekly basis. In the interim, she has developed [name] new pressure ulcer on her left lateral malleolus. The plantar surface of her right foot has healed. The cavity on her right buttock has contracted, but it remains quite deep and the trochanter, while not exposed, is palpable at the base. The pressure ulcer on her right medial malleolus is basically unchanged. 02/09/2022: All wounds are little bit smaller today. As the buttock abscess cavity has contracted, it has left some tunneling that has not been adequately packed. Light slough accumulation on both ankle wounds. 02/26/2022: All of the wounds continue to contract. There is slough on both ankle wounds. The buttock ulcer is clean. She brought her wound VAC with her  today. 03/12/2022: The ankle wounds are about the same this week with slough accumulation. The buttock ulcer is smaller and quite clean. She decided that she could not tolerate the discomfort of the wound VAC and removed it. They have been packing the wound with Dakin's moistened gauze. 03/25/2022: The ankle wounds are slightly smaller. Both have slough accumulation. The orifice of the buttock ulcer has gotten quite [name] bit smaller than the underlying cavity. She has decided that she would like to have the wound VAC back. 3/14; patient presents for follow-up. She has bilateral ankle wounds and has been using Hydrofera Blue to the these wound beds. She has Prevalon boots to help with offloading. She also has [name] sacral wound that we have been using [name] wound VAC for. She has no issues or complaints today. 04/29/2022: She once again decided she did not like the wound VAC and so they have been packing her gluteal wound with Dakin's moistened gauze. The gluteal ulcer seems to be about the same. Her left lateral ankle wound has gotten deeper and larger. The surface tissue is gray with thick slough. No significant change to the right medial ankle wound. 05/18/2022: The depth on the gluteal ulcer has come in [name] bit. There was [name] very strong pungent odor when the packing was removed, however. The right medial ankle wound is about the same size, but the tissue surface looks healthier. The left lateral ankle wound has reaccumulated the rubbery gray slough. Electronic Signature(s) Signed: 05/18/2022 2:55:56 PM By: Duanne Guess MD FACS Entered By: Duanne Guess on 05/18/2022 14:55:55 -------------------------------------------------------------------------------- Physical Exam Details Patient Name: Date of Service: Andrea [name] 05/18/2022 2:00 PM Medical Record Number: [id number] Patient Account Number: [id number] Date of Birth/Sex: Treating RN: 11-Jan-1941 (82 y.o. F) Primary Care Provider: Jarome Matin  Other Clinician: Referring Provider: Treating Provider/Extender: Marena Chancy in Treatment: 25 Constitutional Hypertensive, asymptomatic. . . . no acute distress. Respiratory Andrea [name], Andrea [name] ([id number]) [id number] Page  3 of 10 Normal work of breathing on room air. Notes 05/18/2022: The depth on the gluteal ulcer has come in [name] bit. There was [name] very strong pungent odor when the packing was removed, however. The right medial ankle wound is about the same size, but the tissue surface looks healthier. The left lateral ankle wound has reaccumulated the rubbery gray slough. Electronic Signature(s) Signed: 05/18/2022 3:01:30 PM By: Duanne Guess MD FACS Entered By: Duanne Guess on 05/18/2022 15:01:30 -------------------------------------------------------------------------------- Physician Orders Details Patient Name: Date of Service: Andrea [name] 05/18/2022 2:00 PM Medical Record Number: [id number] Patient Account Number: [id number] Date of Birth/Sex: Treating RN: [birth year] (81 y.o. 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 - Dr. Lady Gary - Room 1 Return [name] Monday 5/6 @ 2:45 pm Anesthetic (In clinic) Topical Lidocaine 4% applied to wound bed Bathing/ Shower/ Hygiene May shower and wash wound with soap and  water. Off-Loading Multipodus Splint to: - prevalon boot to both feet Turn and reposition every 2 hours Home Health No change in wound care orders this week; continue Home Health for wound care. May utilize formulary equivalent dressing for wound treatment orders unless otherwise specified. Dressing changes to be completed by Home Health on Monday / Wednesday / Friday except when patient has scheduled visit at [facility]. Other Home Health Orders/Instructions: - [facility] Wound Treatment Wound #1 - Malleolus Wound Laterality: Right, Medial Cleanser: Normal Saline (Generic) Every Other Day/30 Days Discharge Instructions: Cleanse the wound with Normal Saline prior to applying [name] clean dressing using gauze sponges, not tissue or cotton balls. Cleanser: Byram Ancillary Kit - 15 Day Supply (Generic) Every Other Day/30 Days Discharge Instructions: Use supplies as instructed; Kit contains: (15) Saline Bullets; (15) 3x3 Gauze; 15 pr Gloves Peri-Wound Care: Skin Prep (Generic) Every Other Day/30 Days Discharge Instructions: Use skin prep as directed Prim Dressing: Endoform 2x2 in (Dispense As Written) Every Other Day/30 Days ary Discharge Instructions: Moisten with saline Secondary Dressing: Drawtex 4x4 in (Dispense As Written) Every Other Day/30 Days Discharge Instructions: Apply over primary dressing cut to fit inside wound edges Secondary Dressing: Zetuvit Plus Silicone Border Dressing 4x4 (in/in) (Generic) Every Other Day/30 Days Discharge Instructions: Apply silicone border over primary dressing as directed. Andrea [name], Andrea [name] ([id number]) [id number] Page 4 of 10 Wound #2 - Gluteus Wound Laterality: Right Cleanser: Normal Saline 1 x Per Day/30 Days Discharge Instructions: Cleanse the wound with Normal Saline prior to applying [name] clean dressing using gauze sponges, not tissue or cotton balls. Peri-Wound Care: Skin Prep (Generic) 1 x Per Day/30 Days Discharge  Instructions: Use skin prep as directed Prim Dressing: Dakin's Solution 0.25%, 16 (oz) 1 x Per Day/30 Days ary Discharge Instructions: Moisten gauze with Dakin's solution Secondary Dressing: Zetuvit Plus Silicone Border Dressing 5x5 (in/in) (Dispense As Written) 1 x Per Day/30 Days Discharge Instructions: Apply silicone border over primary dressing as directed. Wound #4 - Ankle Wound Laterality: Left, Lateral Cleanser: Normal Saline (Generic) Every Other Day/30 Days Discharge Instructions: Cleanse the wound with Normal Saline  prior to applying [name] clean dressing using gauze sponges, not tissue or cotton balls. Cleanser: Byram Ancillary Kit - 15 Day Supply (Generic) Every Other Day/30 Days Discharge Instructions: Use supplies as instructed; Kit contains: (15) Saline Bullets; (15) 3x3 Gauze; 15 pr Gloves Peri-Wound Care: Skin Prep (Generic) Every Other Day/30 Days Discharge Instructions: Use skin prep as directed Prim Dressing: Endoform 2x2 in (Dispense As Written) Every Other Day/30 Days ary Discharge Instructions: Moisten with saline Secondary Dressing: Drawtex 4x4 in (Dispense As Written) Every Other Day/30 Days Discharge Instructions: Apply over primary dressing cut to fit inside wound edges Secondary Dressing: Zetuvit Plus Silicone Border Dressing 4x4 (in/in) (Generic) Every Other Day/30 Days Discharge Instructions: Apply silicone border over primary dressing as directed. Electronic Signature(s) Signed: 05/18/2022 3:37:18 PM By: Duanne Guess MD FACS Entered By: Duanne Guess on 05/18/2022 15:01:52 -------------------------------------------------------------------------------- Problem List Details Patient Name: Date of Service: Andrea [name] 05/18/2022 2:00 PM Medical Record Number: [id number] Patient Account Number: [id number] Date of Birth/Sex: Treating RN: 05/08/1940 (81 y.o. 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 without 01/01/2022 No Yes evidence of necrosis L02.31 Cutaneous abscess of buttock 11/24/2021 No Yes E27.40 Unspecified adrenocortical insufficiency 11/24/2021 No Yes MALEYAH, EVANS [name] ([id number]) [id number] Page 5 of 10 [phone] Long term (current) use of systemic steroids 11/24/2021 No Yes I10 Essential (primary) hypertension 11/24/2021 No Yes Inactive Problems Resolved Problems ICD-10 Code Description Active Date Resolved Date L97.511 Non-pressure chronic ulcer of other part of right foot limited to breakdown of skin 12/04/2021 12/04/2021 Electronic Signature(s) Signed: 05/18/2022 2:53:02 PM By: Duanne Guess MD FACS Entered By: Duanne Guess on 05/18/2022 14:53:02 -------------------------------------------------------------------------------- Progress Note Details Patient Name: Date of Service: Andrea [name]. 05/18/2022 2:00 PM Medical Record Number: [id number] Patient Account Number: [id number] Date of Birth/Sex: Treating RN: [birth year] (81 y.o. 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 She is not diabetic and quit  smoking over 40 years ago. 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 She has developed [name] pressure ulcer on the right medial malleolus. In addition, she apparently has had multiple cutaneous abscesses on her buttocks that have required repeated incision and drainage procedures. She has developed [name] large abscess on her right buttock that has become painful. She was recently treated with cephalexin by her PCP for urinary tract infection and also received [name] shot of Rocephin for the abscess, but it has not yet been incised or drained. She is nonambulatory but is able to stand to transfer. She does not wear any sort of Prevalon boot. ABI in clinic today was 0.96. On her right medial malleolus, there is [name] circular ulcer. The surface is dry and fibrotic. There is some periwound erythema, but  no malodor or purulent drainage. On her right buttock, there is [name] large fluctuant bulge about the size of an orange. It is also erythematous and tender. 12/04/2021: The culture from her abscess grew out staph. [name] 10-day course of Bactrim was prescribed and she is currently taking this. Unfortunately, Dakin's solution was not delivered and only 2 x 2 gauze was sent, rather than Kerlix so the packing of the wound has been rather suboptimal. The wound cavity has light slough over all of the surfaces. Although covered, bone is palpable. The medial ankle wound looks about the same with [name] layer of slough accumulation. In addition, her daughter peeled some dry skin off of her foot this morning and she now has denuded areas over the majority of the plantar surface of her right foot. 01/01/2022: The patient has missed several visits and to her daughters report that it is somewhat difficult to get her here on [name] weekly basis. In the interim, she has developed [name] new pressure ulcer on her left lateral malleolus. The plantar surface of her right foot has  healed. The cavity on her right buttock has contracted, but it remains quite deep and the trochanter, while not exposed, is palpable at the base. The pressure ulcer on her right medial malleolus is basically unchanged. 02/09/2022: All wounds are little bit smaller today. As the buttock abscess cavity has contracted, it has left some tunneling that has not been adequately packed. Light slough accumulation on both ankle wounds. 02/26/2022: All of the wounds continue to contract. There is slough on both ankle wounds. The buttock ulcer is clean. She brought her wound VAC with her today. 03/12/2022: The ankle wounds are about the same this week with slough accumulation. The buttock ulcer is smaller and quite clean. She decided that she could not tolerate the discomfort of the wound VAC and removed it. They have been packing the wound with Dakin's moistened gauze. 03/25/2022: The ankle wounds are slightly smaller. Both have slough accumulation. The orifice of the buttock ulcer has gotten quite [name] bit smaller than the underlying cavity. She has decided that she would like to have the wound VAC back. 3/14; patient presents for follow-up. She has bilateral ankle wounds and has been using Hydrofera Blue to the these wound beds. She has Prevalon boots to EMORY, LEAVER ([id number]) [id number] Page 6 of 10 help with offloading. She also has [name] sacral wound that we have been using [name] wound VAC for. She has no issues or complaints today. 04/29/2022: She once again decided she did not like the wound VAC and so they have been packing her gluteal wound with Dakin's moistened gauze. The gluteal ulcer seems to be about the same. Her left lateral ankle wound has gotten deeper and larger. The surface tissue is gray with thick slough. No significant change to the right medial ankle wound. 05/18/2022: The depth on the gluteal ulcer has come in [name] bit. There was [name] very strong pungent odor when the packing  was removed, however. The right medial ankle wound is about the same size, but the tissue surface looks healthier. The left lateral ankle wound has reaccumulated the rubbery gray slough. Patient History Information obtained from Patient, Caregiver, Chart. Family History Cancer - Father,Maternal Grandparents,Siblings,Child, Heart Disease - Father, Hypertension - Father, Thyroid Problems - Mother, No family history of Diabetes, Hereditary Spherocytosis, Kidney Disease, Lung Disease, Seizures, Stroke, Tuberculosis. Social History Former smoker - quit 40 yr ago, Marital Status - Widowed, Alcohol Use - Never,  Drug Use - No History, Caffeine Use - Daily - tea, soda. Medical History Eyes Patient has history of Cataracts - bil extracted Denies history of Glaucoma, Optic Neuritis Cardiovascular Patient has history of Arrhythmia - afib, Hypertension Endocrine Denies history of Type I Diabetes, Type II Diabetes Genitourinary Denies history of End Stage Renal Disease Integumentary (Skin) Denies history of History of Burn Musculoskeletal Patient has history of Osteoarthritis, Osteomyelitis - pelvic Neurologic Patient has history of Neuropathy Oncologic Denies history of Received Chemotherapy, Received Radiation Psychiatric Patient has history of Confinement Anxiety Denies history of Anorexia/bulimia Hospitalization/Surgery History - TEE. - right ankle fusion. - bil knee replacements. - bil cataract extractions. - posterior lumbar fusion. - vaginal hysterectomy. - cholecystectomy. 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 no acute distress. Vitals Time Taken: 1:45 AM, Height: 59 in,  Weight: 155 lbs, BMI: 31.3, Temperature: 97.7 F, Pulse: 77 bpm, Respiratory Rate: 18 breaths/min, Blood Pressure: 153/80 mmHg. Respiratory Normal work of breathing on room air. General Notes: 05/18/2022: The depth on the gluteal ulcer has come in [name] bit. There was [name] very strong pungent odor when the packing was removed, however. The right medial ankle wound is about the same size, but the tissue surface looks healthier. The left lateral ankle wound has reaccumulated the rubbery gray slough. Integumentary (Hair, Skin) Wound #1 status is Open. Original cause of wound was Pressure Injury. The date acquired was: 11/16/2021. The wound has been in treatment 25 weeks. The wound is located on the Right,Medial Malleolus. The wound measures 1.7cm length x 1.5cm width x 0.1cm depth; 2.003cm^2 area and 0.2cm^3 volume. There is Fat Layer (Subcutaneous Tissue) exposed. There is no tunneling or undermining noted. There is [name] medium amount of serosanguineous drainage noted. The wound margin is distinct with the outline attached to the wound base. There is large (67-100%) red, pink granulation within the wound bed. There is [name] small (1JAMEY, DEMCHAK [name] ([id number]) [id number] Page 7 of 10 33%) amount of necrotic tissue within the wound bed including Adherent Slough. The periwound skin appearance had no abnormalities noted for texture. The periwound skin appearance had no abnormalities noted for moisture. The periwound skin appearance exhibited: Erythema. The surrounding wound skin color is noted with erythema. Erythema is measured at 1 cm. Periwound temperature was noted as No Abnormality. Wound #2 status is Open. Original cause of wound was Bump. The date acquired was: 09/28/2021. The wound has been in treatment 25 weeks. The wound is located on the Right Gluteus. The wound measures 0.2cm length x 2.5cm width x 4.5cm depth; 0.393cm^2 area and 1.767cm^3 volume. There is Fat  Layer (Subcutaneous Tissue) exposed. There is no tunneling noted, however, there is undermining starting at 12:00 and ending at 12:00 with [name] maximum distance of 4.3cm. There is [name] medium amount of serous drainage noted. Foul odor after cleansing was noted. The wound margin is well defined and not attached to the wound base. There is large (67-100%) red, pink granulation within the wound bed. There is [name] small (1-33%) amount of necrotic tissue within the wound bed including Adherent Slough. The periwound skin appearance had no abnormalities noted for texture. The periwound skin appearance had no abnormalities noted for moisture. The periwound skin appearance had no abnormalities noted for color. Periwound temperature was noted as No Abnormality. The periwound has  tenderness on palpation. Wound #4 status is Open. Original cause of wound was Pressure Injury. The date acquired was: 12/23/2021. The wound has been in treatment 19 weeks. The wound is located on the Left,Lateral Ankle. The wound measures 1cm length x 1cm width x 0.3cm depth; 0.785cm^2 area and 0.236cm^3 volume. There is Fat Layer (Subcutaneous Tissue) exposed. There is no tunneling noted, however, there is undermining starting at 6:00 and ending at 12:00 with [name] maximum distance of 0.4cm. There is [name] medium amount of serosanguineous drainage noted. The wound margin is distinct with the outline attached to the wound base. There is small (1-33%) pink, pale granulation within the wound bed. There is [name] large (67-100%) amount of necrotic tissue within the wound bed. The periwound skin appearance had no abnormalities noted for texture. The periwound skin appearance had no abnormalities noted for moisture. The periwound skin appearance had no abnormalities noted for color. Periwound temperature was noted as No Abnormality. 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 There was [name] Excisional Skin/Subcutaneous Tissue Debridement with [name] total area of 0.78 sq cm performed by Duanne Guess, MD. With the following instrument(s): Curette to remove Viable and Non-Viable tissue/material. Material removed includes Subcutaneous Tissue and Slough and after achieving pain control using Lidocaine 4% T opical Solution. No specimens were taken. [name] time out was conducted at 14:40, prior to the start of the procedure. [name] Minimum amount of bleeding was controlled with Pressure. The procedure was tolerated well with [name] pain level of 0 throughout and [name] pain level of 0 following the procedure. Post Debridement Measurements: 1cm length x 1cm width x 0.3cm depth; 0.236cm^3 volume. Post debridement Stage noted as Category/Stage III. Character of Wound/Ulcer Post Debridement is stable. Post procedure Diagnosis Wound #4: Same as Pre-Procedure General Notes: scribed for Dr. Lady Gary by Zenaida Deed, RN. Plan Follow-up Appointments: Return Appointment in 2 weeks. - Dr. Lady Gary - Room 1 Monday 5/6 @ 2:45 pm Anesthetic: (In clinic) Topical Lidocaine 4% applied to wound bed Bathing/ Shower/ Hygiene: May shower and wash wound with soap and water. Off-Loading: Multipodus Splint to: - prevalon boot to both feet Turn and reposition every 2 hours Home Health: No change in wound care orders this week; continue Home Health for wound care. May utilize formulary equivalent dressing for wound treatment orders unless otherwise specified. Dressing changes to be completed by Home Health on Monday / Wednesday / Friday except when patient has scheduled visit at [facility]. Other Home Health Orders/Instructions: - [facility] WOUND #1: - Malleolus Wound Laterality: Right, Medial Cleanser: Normal Saline (Generic) Every Other Day/30 Days Discharge Instructions: Cleanse the wound with Normal Saline prior to applying [name] clean dressing using gauze sponges, not tissue or cotton balls. Cleanser: Byram Ancillary Kit - 15 Day Supply (Generic) Every Other Day/30 Days Discharge Instructions: Use supplies as instructed; Kit contains: (15) Saline Bullets; (15) 3x3 Gauze; 15 pr Gloves Peri-Wound Care: Skin Prep (Generic) Every Other Day/30 Days Discharge Instructions: Use skin prep as directed Prim Dressing: Endoform 2x2 in (Dispense As Written) Every Other Day/30 Days ary Discharge Instructions: Moisten with saline Secondary Dressing: Drawtex 4x4 in (Dispense As Written) Every Other  Day/30 Days Discharge Instructions: Apply over primary dressing cut to fit inside wound edges Secondary Dressing: Zetuvit Plus Silicone Border Dressing 4x4 (in/in) (Generic) Every Other Day/30 Days Discharge Instructions: Apply silicone border over primary dressing as directed. Andrea [name], Andrea [name] ([id number]) [id number] Page 8 of 10 WOUND #2: - Gluteus Wound Laterality: Right Cleanser: Normal Saline 1 x Per Day/30 Days Discharge Instructions: Cleanse the wound with Normal Saline prior to applying [name] clean dressing using gauze sponges, not tissue or cotton balls. Peri-Wound Care: Skin Prep (Generic) 1 x Per Day/30 Days Discharge Instructions: Use skin prep as directed Prim Dressing: Dakin's Solution 0.25%, 16 (oz) 1 x Per Day/30 Days ary Discharge Instructions: Moisten gauze with Dakin's solution Secondary Dressing: Zetuvit Plus Silicone Border Dressing 5x5 (in/in) (Dispense As Written) 1 x Per Day/30 Days Discharge Instructions: Apply silicone border over primary dressing as directed. WOUND #4: - Ankle Wound Laterality: Left, Lateral Cleanser: Normal Saline  (Generic) Every Other Day/30 Days Discharge Instructions: Cleanse the wound with Normal Saline prior to applying [name] clean dressing using gauze sponges, not tissue or cotton balls. Cleanser: Byram Ancillary Kit - 15 Day Supply (Generic) Every Other Day/30 Days Discharge Instructions: Use supplies as instructed; Kit contains: (15) Saline Bullets; (15) 3x3 Gauze; 15 pr Gloves Peri-Wound Care: Skin Prep (Generic) Every Other Day/30 Days Discharge Instructions: Use skin prep as directed Prim Dressing: Endoform 2x2 in (Dispense As Written) Every Other Day/30 Days ary Discharge Instructions: Moisten with saline Secondary Dressing: Drawtex 4x4 in (Dispense As Written) Every Other Day/30 Days Discharge Instructions: Apply over primary dressing cut to fit inside wound edges Secondary Dressing: Zetuvit Plus Silicone Border Dressing 4x4 (in/in) (Generic) Every Other Day/30 Days Discharge Instructions: Apply silicone border over primary dressing as directed. 05/18/2022: The depth on the gluteal ulcer has come in [name] bit. There was [name] very strong pungent odor when the packing was removed, however. The right medial ankle wound is about the same size, but the tissue surface looks healthier. The left lateral ankle wound has reaccumulated the rubbery gray slough. I used [name] curette to debride slough and subcutaneous tissue from the left lateral ankle wound. The right medial ankle wound did not require debridement. Continue endoform to both sites. Upon further questioning, the patient's daughter states that the current Lisette Grinder solution that they are using actually does not have much of the chlorine smell at all and I question whether or not she has [name] good bottle of product. They are going to change bottles and see if this helps with the odor. Continue to pack the gluteal wound with Dakin's moistened gauze. If there is no improvement, we will need to get [name] culture. Follow-up in 2 weeks. Electronic Signature(s) Signed:  05/18/2022 3:04:01 PM By: Duanne Guess MD FACS Entered By: Duanne Guess on 05/18/2022 15:04:01 -------------------------------------------------------------------------------- HxROS Details Patient Name: Date of Service: Andrea [name]. 05/18/2022 2:00 PM Medical Record Number: [id number] Patient Account Number: [id number] Date of Birth/Sex: Treating RN: 1940-10-17 (81 y.o. F) Primary Care Provider: Jarome Matin Other Clinician: Referring Provider: Treating Provider/Extender: Marena Chancy in Treatment: 25 Information Obtained From Patient Caregiver Chart Constitutional Symptoms (General Health) Medical History: Past Medical History Notes: morbid obesity Eyes Medical History: Positive for: Cataracts - bil extracted Negative for: Glaucoma; Optic Neuritis Ear/Nose/Mouth/Throat Medical History: Past Medical History Notes: hard of hearing Cardiovascular Medical History: Positive for: Arrhythmia - afib; Hypertension Gastrointestinal Medical History: Past Medical History NotesDAYLYN, Andrea [name] ([id number]) [id number] Page 9 of 10 GERD  Endocrine Medical History: Negative for: Type I Diabetes; Type II Diabetes Past Medical History Notes: adrenal insufficiency Genitourinary Medical History: Negative for: End Stage Renal Disease Integumentary (Skin) Medical History: Negative for: History of Burn Musculoskeletal Medical History: Positive for: Osteoarthritis; Osteomyelitis - pelvic Past Medical History Notes: psoriatic arthritis, thoracic discitis, displaced spiral fx right tibia Neurologic Medical History: Positive for: Neuropathy Past Medical History Notes: hx [facility] Oncologic Medical History: Negative for: Received Chemotherapy; Received Radiation Psychiatric Medical History: Positive for: Confinement Anxiety Negative for: Anorexia/bulimia HBO Extended History  Items Eyes: Cataracts Immunizations Pneumococcal Vaccine: Received Pneumococcal Vaccination: Yes Received Pneumococcal Vaccination On or After 60th Birthday: Yes Implantable Devices None Hospitalization / Surgery History Type of Hospitalization/Surgery TEE right ankle fusion bil knee replacements bil cataract extractions posterior lumbar fusion vaginal hysterectomy cholecystectomy Family and Social History Cancer: Yes - Father,Maternal Grandparents,Siblings,Child; Diabetes: No; Heart Disease: Yes - Father; Hereditary Spherocytosis: No; Hypertension: Yes - Father; Kidney Disease: No; Lung Disease: No; Seizures: No; Stroke: No; Thyroid Problems: Yes - Mother; Tuberculosis: No; Former smoker - quit 40 yr ago; Marital Status - Widowed; Alcohol Use: Never; Drug Use: No History; Caffeine Use: Daily - tea, soda; Financial Concerns: No; Food, Clothing or Shelter Needs: No; Support System Lacking: No; Transportation Concerns: No Electronic Signature(s) Signed: 05/18/2022 3:37:18 PM By: Duanne Guess MD FACS Entered By: Duanne Guess on 05/18/2022 14:56:14 Andrea [name] ([id number]) [id number] Page 10 of 10 -------------------------------------------------------------------------------- SuperBill Details Patient Name: Date of Service: Andrea [name] 05/18/2022 Medical Record Number: [id number] Patient Account Number: [id number] Date of Birth/Sex: Treating RN: 08-16-1940 (82 y.o. 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 L02.31 Cutaneous abscess of buttock E27.40 Unspecified adrenocortical  insufficiency Z79.52 Long term (current) use of systemic steroids I10 Essential (primary) hypertension Facility Procedures : CPT4 Code: [id number] Description: 11042 - DEB SUBQ TISSUE 20 SQ CM/< ICD-10 Diagnosis Description L89.522 Pressure ulcer of left ankle, stage 2 Modifier: Quantity: 1 Physician Procedures : CPT4 Code Description Modifier [id number] - WC PHYS LEVEL 4 - EST PT 25 ICD-10 Diagnosis Description 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, stage 3 L89.522 Pressure ulcer of left ankle, stage 2 L98.415 Non-pressure chronic ulcer of buttock with  muscle involvement without evidence of necrosis Z79.52 Long term (current) use of systemic steroids Quantity: 1 : [id number] 11042 - WC PHYS SUBQ TISS 20 SQ CM ICD-10 Diagnosis Description L89.522 Pressure ulcer of left ankle, stage 2 Quantity: 1 Electronic Signature(s) Signed: 05/18/2022 3:05:04 PM By: Duanne Guess MD FACS Entered By: Duanne Guess on 05/18/2022 15:05:03

## 2022-05-31 ENCOUNTER — Encounter (HOSPITAL_BASED_OUTPATIENT_CLINIC_OR_DEPARTMENT_OTHER): Payer: Medicare Other | Attending: General Surgery | Admitting: General Surgery

## 2022-05-31 DIAGNOSIS — L98415 Non-pressure chronic ulcer of buttock with muscle involvement without evidence of necrosis: Secondary | ICD-10-CM | POA: Insufficient documentation

## 2022-05-31 DIAGNOSIS — L89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, stage 3: Secondary | ICD-10-CM | POA: Diagnosis not present

## 2022-05-31 DIAGNOSIS — L89523 Pressure ulcer of left ankle, stage 3: Secondary | ICD-10-CM | POA: Diagnosis present

## 2022-05-31 DIAGNOSIS — Z7952 Long term (current) use of systemic steroids: Secondary | ICD-10-CM | POA: Insufficient documentation

## 2022-05-31 DIAGNOSIS — L0231 Cutaneous abscess of buttock: Secondary | ICD-10-CM | POA: Insufficient documentation

## 2022-05-31 DIAGNOSIS — E2740 Unspecified adrenocortical insufficiency: Secondary | ICD-10-CM | POA: Insufficient documentation

## 2022-05-31 DIAGNOSIS — I10 Essential (primary) hypertension: Secondary | ICD-10-CM | POA: Insufficient documentation

## 2022-06-01 NOTE — Progress Notes (Signed)
CAGNEY, FALSO Yu ([id number]) [id number] Page 1 of 12 Visit Report for 05/31/2022 Chief Complaint Document Details Patient Name: Date of Service: Andrea Yu 05/31/2022 2:45 PM Medical Record Number: [id number] Patient Account Number: [id number] Date of Birth/Sex: Treating RN: [birth year] (82 y.o. F) Primary Care Provider: Jarome Matin Other Clinician: Referring Provider: Treating Provider/Extender: Marena Chancy in Treatment: 26 Information Obtained from: Patient Chief Complaint Patient is at the clinic for treatment of an open pressure ulcer on her right medial ankle, and Yu large abscess on her right buttock Electronic Signature(s) Signed: 05/31/2022 3:43:22 PM By: Duanne Guess MD FACS Entered By: Duanne Guess on 05/31/2022 15:43:21 -------------------------------------------------------------------------------- Debridement Details Patient Name: Date of Service: Andrea Yu 05/31/2022 2:45 PM Medical Record Number: [id number] Patient Account Number: [id number] Date of Birth/Sex: Treating RN: 06-08-1940 (81 y.o. Katrinka Blazing Primary Care Provider: Jarome Matin Other Clinician: Referring Provider: Treating Provider/Extender: Marena Chancy in Treatment: 26 Debridement Performed for Assessment: Wound #2 Right Gluteus Performed By: Physician Duanne Guess, MD Debridement Type: Debridement Level of Consciousness (Pre-procedure): Awake and Alert Pre-procedure Verification/Time Out Yes - 15:30 Taken: Start Time: 15:31 Percent of Wound Bed Debrided: 100% T Area Debrided (cm): otal 1.55 Tissue and other material debrided: Non-Viable, Slough, Slough Level: Non-Viable Tissue Debridement Description: Selective/Open Wound Instrument: Curette Bleeding: Minimum Hemostasis Achieved: Pressure Procedural Pain: 1 Post Procedural Pain: 0 Response to Treatment: Procedure was tolerated  well Level of Consciousness (Post- Awake and Alert procedure): Post Debridement Measurements of Total Wound Length: (cm) 0.9 Width: (cm) 2.2 Depth: (cm) 4.2 Volume: (cm) 6.531 Character of Wound/Ulcer Post Debridement: Stable Post Procedure Diagnosis Same as Pre-procedure Notes scribed for Dr. Lady Gary by Andrea Deed, RN Electronic Signature(s) Signed: 05/31/2022 3:49:45 PM By: Duanne Guess MD FACS Signed: 05/31/2022 4:31:42 PM By: Karie Schwalbe RN Entered By: Karie Schwalbe on 05/31/2022 15:33:09 Andrea Yu ([id number]) [id number] Page 2 of 12 -------------------------------------------------------------------------------- Debridement Details Patient Name: Date of Service: Andrea Yu 05/31/2022 2:45 PM Medical Record Number: [id number] Patient Account Number: [id number] Date of Birth/Sex: Treating RN: Mar 16, 1940 (82 y.o. Katrinka Blazing Primary Care Provider: Jarome Matin Other Clinician: Referring Provider: Treating Provider/Extender: Marena Chancy in Treatment: 26 Debridement Performed for Assessment: Wound #1 Right,Medial Malleolus Performed By: Physician Duanne Guess, MD Debridement Type: Debridement Level of Consciousness (Pre-procedure): Awake and Alert Pre-procedure Verification/Time Out Yes - 15:30 Taken: Start Time: 15:31 Pain Control: Lidocaine 4% T opical Solution Percent of Wound Bed Debrided: 100% T Area Debrided (cm): otal 2 Tissue and other material debrided: Non-Viable, Slough, Slough Level: Non-Viable Tissue Debridement Description: Selective/Open Wound Instrument: Curette Bleeding: Minimum Hemostasis Achieved: Pressure Procedural Pain: 1 Post Procedural Pain: 0 Response to Treatment: Procedure was tolerated well Level of Consciousness (Post- Awake and Alert procedure): Post Debridement Measurements of Total Wound Length: (cm) 1.7 Stage: Category/Stage III Width:  (cm) 1.5 Depth: (cm) 0.2 Volume: (cm) 0.401 Character of Wound/Ulcer Post Debridement: Stable Post Procedure Diagnosis Same as Pre-procedure Notes scribed for Dr. Lady Gary by Andrea Deed, RN Electronic Signature(s) Signed: 05/31/2022 3:49:45 PM By: Duanne Guess MD FACS Signed: 05/31/2022 4:31:42 PM By: Karie Schwalbe RN Entered By: Karie Schwalbe on 05/31/2022 15:34:40 -------------------------------------------------------------------------------- Debridement Details Patient Name: Date of Service: Andrea Yu 05/31/2022 2:45 PM Medical Record Number: [id number] Patient Account Number: [id number] Date of Birth/Sex: Treating RN: 01/31/1940 (81 y.o. Katrinka Blazing Primary Care Provider: Jarome Matin Other Clinician: Referring Provider: Treating Provider/Extender:  Marena Chancy in Treatment: 26 Debridement Performed for Assessment: Wound #4 Left,Lateral Ankle Performed By: Physician Duanne Guess, MD Debridement Type: Debridement Level of Consciousness (Pre-procedure): Awake and Alert Pre-procedure Verification/Time Out Yes - 15:30 Taken: Start Time: 15:31 Pain Control: Lidocaine 4% T opical Solution Percent of Wound Bed Debrided: 100% T Area Debrided (cm): otal 1.22 Tissue and other material debrided: Viable, Non-Viable, Slough, Subcutaneous, Slough Level: Skin/Subcutaneous Tissue Debridement Description: Excisional Andrea Yu, Andrea Yu ([id number]) [id number] Page 3 of 12 Instrument: Curette Bleeding: Minimum Hemostasis Achieved: Pressure Procedural Pain: 1 Post Procedural Pain: 0 Response to Treatment: Procedure was tolerated well Level of Consciousness (Post- Awake and Alert procedure): Post Debridement Measurements of Total Wound Length: (cm) 1.3 Stage: Category/Stage III Width: (cm) 1.2 Depth: (cm) 0.5 Volume: (cm) 0.613 Character of Wound/Ulcer Post Debridement: Stable Post Procedure  Diagnosis Same as Pre-procedure Notes scribed for Dr. Lady Gary by Andrea Deed, RN Electronic Signature(s) Signed: 05/31/2022 3:49:45 PM By: Duanne Guess MD FACS Signed: 05/31/2022 4:31:42 PM By: Karie Schwalbe RN Entered By: Karie Schwalbe on 05/31/2022 15:35:14 -------------------------------------------------------------------------------- HPI Details Patient Name: Date of Service: Andrea Yu 05/31/2022 2:45 PM Medical Record Number: [id number] Patient Account Number: [id number] Date of Birth/Sex: Treating RN: [birth year] (81 y.o. 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 She is not diabetic and quit smoking over 40 years ago. 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 She has developed Yu pressure ulcer on the right medial malleolus. In addition, she apparently has had multiple cutaneous abscesses on her buttocks that have required repeated incision and drainage procedures. She has developed Yu large abscess on her right buttock that has become painful. She was recently treated with cephalexin by her PCP for urinary tract infection and also received Yu shot of Rocephin for the abscess, but it has not yet been incised or drained. She is nonambulatory but is able to stand to transfer. She does not wear any sort of Prevalon boot. ABI in clinic today was 0.96. On her right medial malleolus, there is Yu circular ulcer. The surface is dry and fibrotic. There is some periwound erythema, but no malodor or purulent drainage. On her right buttock, there is Yu large fluctuant bulge about the size of an orange. It is also erythematous  and tender. 12/04/2021: The culture from her abscess grew out staph. Yu 10-day course of Bactrim was prescribed and she is currently taking this. Unfortunately, Dakin's solution was not delivered and only 2 x 2 gauze was sent, rather than Kerlix so the packing of the wound has been rather suboptimal. The wound cavity has light slough over all of the surfaces. Although covered, bone is palpable. The medial ankle wound looks about the same with Yu layer of slough accumulation. In addition, her daughter peeled some dry skin off of her foot this morning and she now has denuded areas over the majority of the plantar surface of her right foot. 01/01/2022: The patient has missed several visits and to her daughters report that it is somewhat difficult to get her here on Yu weekly basis. In the interim, she has developed Yu new pressure ulcer on her left lateral malleolus. The plantar surface of her right foot  has healed. The cavity on her right buttock has contracted, but it remains quite deep and the trochanter, while not exposed, is palpable at the base. The pressure ulcer on her right medial malleolus is basically unchanged. 02/09/2022: All wounds are little bit smaller today. As the buttock abscess cavity has contracted, it has left some tunneling that has not been adequately packed. Light slough accumulation on both ankle wounds. 02/26/2022: All of the wounds continue to contract. There is slough on both ankle wounds. The buttock ulcer is clean. She brought her wound VAC with her today. 03/12/2022: The ankle wounds are about the same this week with slough accumulation. The buttock ulcer is smaller and quite clean. She decided that she could not tolerate the discomfort of the wound VAC and removed it. They have been packing the wound with Dakin's moistened gauze. 03/25/2022: The ankle wounds are slightly smaller. Both have slough accumulation. The orifice of the buttock ulcer has gotten quite Yu bit smaller than  the underlying cavity. She has decided that she would like to have the wound VAC back. 3/14; patient presents for follow-up. She has bilateral ankle wounds and has been using Hydrofera Blue to the these wound beds. She has Prevalon boots to help with offloading. She also has Yu sacral wound that we have been using Yu wound VAC for. She has no issues or complaints today. 04/29/2022: She once again decided she did not like the wound VAC and so they have been packing her gluteal wound with Dakin's moistened gauze. The gluteal ulcer seems to be about the same. Her left lateral ankle wound has gotten deeper and larger. The surface tissue is gray with thick slough. No significant Andrea Yu, Andrea Yu ([id number]) [id number] Page 4 of 12 change to the right medial ankle wound. 05/18/2022: The depth on the gluteal ulcer has come in Yu bit. There was Yu very strong pungent odor when the packing was removed, however. The right medial ankle wound is about the same size, but the tissue surface looks healthier. The left lateral ankle wound has reaccumulated the rubbery gray slough. 05/31/2022: The gluteal ulcer is shallower and no longer has any dips or crevices. The odor has abated completely. The right medial ankle wound is unchanged. The left lateral ankle wound looks quite Yu bit healthier with Yu cleaner surface and the beginnings of granulation tissue emergence. Electronic Signature(s) Signed: 05/31/2022 3:45:10 PM By: Duanne Guess MD FACS Entered By: Duanne Guess on 05/31/2022 15:45:10 -------------------------------------------------------------------------------- Physical Exam Details Patient Name: Date of Service: Andrea Yu 05/31/2022 2:45 PM Medical Record Number: [id number] Patient Account Number: [id number] Date of Birth/Sex: Treating RN: [birth year] (81 y.o. F) Primary Care Provider: Jarome Matin Other Clinician: Referring Provider: Treating Provider/Extender:  Marena Chancy in Treatment: 26 Constitutional Hypertensive, asymptomatic. . . . no acute distress. Respiratory Normal work of breathing on room air. Notes 05/31/2022: The gluteal ulcer is shallower and no longer has any dips or crevices. The odor has abated completely. The right medial ankle wound is unchanged. The left lateral ankle wound looks quite Yu bit healthier with Yu cleaner surface and the beginnings of granulation tissue emergence. Electronic Signature(s) Signed: 05/31/2022 3:45:55 PM By: Duanne Guess MD FACS Entered By: Duanne Guess on 05/31/2022 15:45:55 -------------------------------------------------------------------------------- Physician Orders Details Patient Name: Date of Service: Andrea Yu 05/31/2022 2:45 PM Medical Record Number: [id number] Patient Account Number: [id number] Date of Birth/Sex: Treating RN: 23-Oct-1940 (81 y.o. 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 - Dr. Lady Gary - Room 1 Return Yu Wed 5/22 @ 12:30 pm Anesthetic (In clinic) Topical Lidocaine 4% applied to wound bed Bathing/ Shower/ Hygiene May shower and wash wound with soap and water. Andrea Yu, Andrea Yu ([id number]) [id number] Page 5 of 12 Off-Loading Multipodus Splint to: - prevalon boot to both feet Turn and  reposition every 2 hours Home Health New wound care orders this week; continue Home Health for wound care. May utilize formulary equivalent dressing for wound treatment orders unless otherwise specified. Dressing changes to be completed by Home Health on Monday / Wednesday / Friday except when patient has scheduled visit at [facility] Other Home Health Orders/Instructions: - [facility] Wound Treatment Wound #1 - Malleolus Wound Laterality: Right, Medial Cleanser: Normal Saline (Generic) Every Other Day/30 Days Discharge Instructions: Cleanse the wound with Normal Saline prior to applying Yu clean dressing using gauze sponges, not tissue or cotton balls. Cleanser: Byram Ancillary Kit - 15 Day Supply (Generic) Every Other Day/30 Days Discharge Instructions: Use supplies as instructed; Kit contains: (15) Saline Bullets; (15) 3x3 Gauze; 15 pr Gloves Peri-Wound Care: Skin Prep (Generic) Every Other Day/30 Days Discharge Instructions: Use skin prep as directed Topical: Skintegrity Hydrogel 4 (oz) Every Other Day/30 Days Discharge Instructions: Apply hydrogel as directed Prim Dressing: Endoform 2x2 in (Dispense As Written) Every Other Day/30 Days ary Discharge Instructions: Moisten with saline Secondary Dressing: Zetuvit Plus Silicone Border Dressing 4x4 (in/in) (Generic) Every Other Day/30 Days Discharge Instructions: Apply silicone border over primary dressing as directed. Wound #2 - Gluteus Wound Laterality: Right Cleanser: Normal Saline 1 x Per Day/30 Days Discharge Instructions: Cleanse the wound with Normal Saline prior to applying Yu clean dressing using gauze sponges, not tissue or cotton balls. Peri-Wound Care: Skin Prep (Generic) 1 x Per Day/30 Days Discharge Instructions: Use skin prep as directed Prim Dressing: Dakin's Solution 0.25%, 16 (oz) 1 x Per Day/30 Days ary Discharge Instructions: Moisten gauze with Dakin's solution Secondary Dressing: Zetuvit Plus Silicone Border  Dressing 5x5 (in/in) (Dispense As Written) 1 x Per Day/30 Days Discharge Instructions: Apply silicone border over primary dressing as directed. Wound #4 - Ankle Wound Laterality: Left, Lateral Cleanser: Normal Saline (Generic) Every Other Day/30 Days Discharge Instructions: Cleanse the wound with Normal Saline prior to applying Yu clean dressing using gauze sponges, not tissue or cotton balls. Cleanser: Byram Ancillary Kit - 15 Day Supply (Generic) Every Other Day/30 Days Discharge Instructions: Use supplies as instructed; Kit contains: (15) Saline Bullets; (15) 3x3 Gauze; 15 pr Gloves Peri-Wound Care: Skin Prep (Generic) Every Other Day/30 Days Discharge Instructions: Use skin prep as directed Topical: Skintegrity Hydrogel 4 (oz) Every Other Day/30 Days Discharge Instructions: Apply hydrogel as directed Prim Dressing: Endoform 2x2 in (Dispense As Written) Every Other Day/30 Days ary Discharge Instructions: Moisten with saline Secondary Dressing: Drawtex 4x4 in (Dispense As Written) Every Other Day/30 Days Discharge Instructions: Apply over primary dressing cut to fit inside wound edges Secondary Dressing: Zetuvit Plus Silicone Border Dressing 4x4 (in/in) (Generic) Every Other Day/30 Days  Discharge Instructions: Apply silicone border over primary dressing as directed. Electronic Signature(s) Signed: 05/31/2022 3:49:45 PM By: Duanne Guess MD FACS Entered By: Duanne Guess on 05/31/2022 15:47:37 Andrea Yu ([id number]) [id number] Page 6 of 12 -------------------------------------------------------------------------------- Problem List Details Patient Name: Date of Service: Andrea Yu 05/31/2022 2:45 PM Medical Record Number: [id number] Patient Account Number: [id number] Date of Birth/Sex: Treating RN: May 10, 1940 (82 y.o. 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 without 01/01/2022 No Yes evidence of necrosis L02.31 Cutaneous abscess of buttock 11/24/2021 No Yes E27.40 Unspecified adrenocortical insufficiency 11/24/2021 No Yes Z79.52 Long term (current) use of systemic steroids 11/24/2021 No Yes I10 Essential (primary) hypertension 11/24/2021 No Yes Inactive Problems Resolved Problems ICD-10 Code Description Active Date Resolved Date L97.511 Non-pressure chronic ulcer of other part of right foot limited to breakdown of skin 12/04/2021 12/04/2021 Electronic Signature(s) Signed: 05/31/2022 3:40:57 PM By: Duanne Guess MD FACS Entered By: Duanne Guess on 05/31/2022 15:40:57 -------------------------------------------------------------------------------- Progress Note Details Patient Name: Date of Service: Andrea Yu 05/31/2022 2:45 PM Medical Record Number: [id number] Patient Account Number: [id number] Date of Birth/Sex: Treating RN: [birth year] (81 y.o. 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 She is not diabetic and quit smoking over 40 years ago. 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 She has developed Yu pressure ulcer on the right medial malleolus. In addition, she apparently has had multiple cutaneous abscesses on her buttocks that have required repeated incision and drainage procedures. She has developed Yu large abscess on her right buttock that has become painful. She was recently treated with cephalexin by her PCP for urinary tract infection and also received Yu shot of Rocephin for the abscess, but it has not yet been incised or drained. She is nonambulatory but is able to stand to transfer. She does not wear any sort of Prevalon boot. ABI in clinic today was 0.96. On her right medial malleolus, there is Yu circular ulcer. The surface is dry and fibrotic. There is some periwound erythema, but no malodor or purulent drainage. On her right buttock, there is Yu large fluctuant bulge about the size of an orange. It is also erythematous and tender. 12/04/2021: The culture from her abscess grew out staph. Yu 10-day course of Bactrim was prescribed and she is currently taking this. Unfortunately, Dakin's solution was not delivered and only 2 x 2 gauze was sent, rather than Kerlix so the packing of the wound has been rather suboptimal. The wound cavity has light slough over all of the surfaces. Although covered, bone is palpable. The medial ankle wound looks about the same with Yu layer of slough accumulation. In  addition, her daughter peeled some dry skin off of her foot this morning and she now has denuded areas over the majority of the plantar surface of her right foot. 01/01/2022: The patient has missed several visits and to her daughters report that it is somewhat difficult to get her here on Yu weekly basis. In the interim, she has developed Yu new pressure ulcer on her left lateral malleolus. The plantar surface of her right foot has healed. The cavity on her right buttock  has contracted, but it remains quite deep and the trochanter, while not exposed, is palpable at the base. The pressure ulcer on her right medial malleolus is basically unchanged. 02/09/2022: All wounds are little bit smaller today. As the buttock abscess cavity has contracted, it has left some tunneling that has not been adequately packed. Light slough accumulation on both ankle wounds. 02/26/2022: All of the wounds continue to contract. There is slough on both ankle wounds. The buttock ulcer is clean. She brought her wound VAC with her today. 03/12/2022: The ankle wounds are about the same this week with slough accumulation. The buttock ulcer is smaller and quite clean. She decided that she could not tolerate the discomfort of the wound VAC and removed it. They have been packing the wound with Dakin's moistened gauze. 03/25/2022: The ankle wounds are slightly smaller. Both have slough accumulation. The orifice of the buttock ulcer has gotten quite Yu bit smaller than the underlying cavity. She has decided that she would like to have the wound VAC back. 3/14; patient presents for follow-up. She has bilateral ankle wounds and has been using Hydrofera Blue to the these wound beds. She has Prevalon boots to help with offloading. She also has Yu sacral wound that we have been using Yu wound VAC for. She has no issues or complaints today. 04/29/2022: She once again decided she did not like the wound VAC and so they have been packing her gluteal wound with Dakin's moistened gauze. The gluteal ulcer seems to be about the same. Her left lateral ankle wound has gotten deeper and larger. The surface tissue is gray with thick slough. No significant change to the right medial ankle wound. 05/18/2022: The depth on the gluteal ulcer has come in Yu bit. There was Yu very strong pungent odor when the packing was removed, however. The right medial ankle wound is about the same size, but the tissue surface looks healthier. The  left lateral ankle wound has reaccumulated the rubbery gray slough. 05/31/2022: The gluteal ulcer is shallower and no longer has any dips or crevices. The odor has abated completely. The right medial ankle wound is unchanged. The left lateral ankle wound looks quite Yu bit healthier with Yu cleaner surface and the beginnings of granulation tissue emergence. Patient History Information obtained from Patient, Caregiver, Chart. Family History Cancer - Father,Maternal Grandparents,Siblings,Child, Heart Disease - Father, Hypertension - Father, Thyroid Problems - Mother, No family history of Diabetes, Hereditary Spherocytosis, Kidney Disease, Lung Disease, Seizures, Stroke, Tuberculosis. Social History Former smoker - quit 40 yr ago, Marital Status - Widowed, Alcohol Use - Never, Drug Use - No History, Caffeine Use - Daily - tea, soda. Medical History Eyes Patient has history of Cataracts - bil extracted Denies history of Glaucoma, Optic Neuritis Cardiovascular Patient has history of Arrhythmia - afib, Hypertension Endocrine Denies history of Type I Diabetes, Type II Diabetes Genitourinary Denies history of End Stage Renal Disease Integumentary (Skin) Denies history of History of Burn Musculoskeletal Patient has history of  Osteoarthritis, Osteomyelitis - pelvic Neurologic Patient has history of Neuropathy Oncologic Denies history of Received Chemotherapy, Received Radiation Psychiatric Patient has history of Confinement Anxiety Denies history of Anorexia/bulimia Hospitalization/Surgery History - TEE. - right ankle fusion. - bil knee replacements. - bil cataract extractions. - posterior lumbar fusion. - vaginal hysterectomy. - cholecystectomy. 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 no acute distress. Vitals Time Taken: 2:12 PM, Height: 59 in, Weight: 155 lbs, BMI: 31.3, Temperature: 98.4 F, Pulse: 82 bpm, Respiratory Rate: 18 breaths/min, Blood Pressure: 152/76 mmHg. Respiratory Normal work of breathing on room air. General Notes: 05/31/2022: The gluteal ulcer is shallower and no longer has any dips or crevices. The odor has abated completely. The right medial ankle wound is unchanged. The left lateral ankle wound looks quite Yu bit healthier with Yu cleaner surface and the beginnings of granulation tissue emergence. Integumentary (Hair, Skin) Wound #1 status is Open. Original cause of wound was Pressure Injury. The date acquired was: 11/16/2021. The wound has been in treatment 26 weeks. The wound is located on the Right,Medial Malleolus. The wound measures 1.7cm length x 1.5cm width x 0.2cm depth; 2.003cm^2 area and 0.401cm^3 volume. There is Fat Layer (Subcutaneous Tissue) exposed. There is no tunneling or undermining noted. There is Yu medium amount of serosanguineous drainage noted. The wound margin is distinct with the outline attached to the wound base. There is large (67-100%) red, pink granulation within the wound bed. There is Yu small (1- 33%) amount of necrotic tissue within the wound bed including Adherent Slough. The periwound skin appearance had no abnormalities noted for texture. The periwound skin appearance had no abnormalities noted for moisture. The periwound skin appearance exhibited: Erythema. The surrounding wound skin color is noted with erythema. Erythema is measured at 1 cm. Periwound temperature was noted as No Abnormality. Wound #2 status is Open. Original cause of wound was Bump. The date acquired was: 09/28/2021. The wound has  been in treatment 26 weeks. The wound is located on the Right Gluteus. The wound measures 0.9cm length x 2.2cm width x 4.2cm depth; 1.555cm^2 area and 6.531cm^3 volume. There is Fat Layer (Subcutaneous Tissue) exposed. There is no tunneling noted, however, there is undermining starting at 12:00 and ending at 9:00 with Yu maximum distance of 3cm. There is Yu medium amount of serous drainage noted. The wound margin is well defined and not attached to the wound base. There is medium (34-66%) pink, pale granulation within the wound bed. There is Yu medium (34-66%) amount of necrotic tissue within the wound bed including Adherent Slough. The periwound skin appearance had no abnormalities noted for texture. The periwound skin appearance had no abnormalities noted for moisture. The periwound skin appearance had no abnormalities noted for color. Periwound temperature was noted as No Abnormality. Wound #4 status is Open. Original cause of wound was Pressure Injury. The date acquired was: 12/23/2021. The wound has been in treatment 21 weeks. The wound is located on the Left,Lateral Ankle. The wound measures 1.3cm length x 1.2cm width x 0.5cm depth; 1.225cm^2 area and 0.613cm^3 volume. There is Fat Layer (Subcutaneous Tissue) exposed. There is no tunneling noted, however, there is undermining starting at 6:00 and ending at 12:00 with Yu maximum distance of 0.3cm. There is Yu medium amount  of serosanguineous drainage noted. The wound margin is distinct with the outline attached to the wound base. There is large (67-100%) red granulation within the wound bed. There is Yu small (1-33%) amount of necrotic tissue within the wound bed including Adherent Slough. The periwound skin appearance had no abnormalities noted for texture. The periwound skin appearance had no abnormalities noted for moisture. The periwound skin appearance had no abnormalities noted for color. Periwound temperature was noted as No  Abnormality. 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 There was Yu Selective/Open Wound Non-Viable Tissue Debridement with Yu total area of 2 sq cm performed by Duanne Guess, MD. With the following instrument(s): Curette to remove Non-Viable tissue/material. Material removed includes [facility] after achieving pain control using Lidocaine 4% Topical Solution. No specimens were taken. Yu time out was conducted at Andrea Yu, Andrea Yu ([id number]) [id number] Page 9 of 12 15:30, prior to the start of the procedure. Yu Minimum amount of bleeding was controlled with Pressure. The procedure was tolerated well with Yu pain level of 1 throughout and Yu pain level of 0 following the procedure. Post Debridement Measurements: 1.7cm length x 1.5cm width x 0.2cm depth; 0.401cm^3 volume. Post debridement Stage noted as Category/Stage III. Character of Wound/Ulcer Post Debridement is stable. Post procedure Diagnosis Wound #1: Same as Pre-Procedure General Notes: scribed for Dr. Lady Gary by Andrea Deed, RN. Wound #2 Pre-procedure diagnosis of Wound #2 is an Abscess located on the Right Gluteus . There was Yu Selective/Open Wound Non-Viable Tissue Debridement with Yu total area of 1.55 sq cm performed by Duanne Guess, MD. With the following instrument(s): Curette to remove Non-Viable tissue/material. Material removed includes [facility]. No specimens were taken. Yu time out was conducted at 15:30, prior to the start of the procedure. Yu Minimum amount of bleeding  was controlled with Pressure. The procedure was tolerated well with Yu pain level of 1 throughout and Yu pain level of 0 following the procedure. Post Debridement Measurements: 0.9cm length x 2.2cm width x 4.2cm depth; 6.531cm^3 volume. Character of Wound/Ulcer Post Debridement is stable. Post procedure Diagnosis Wound #2: Same as Pre-Procedure General Notes: scribed for Dr. Lady Gary by Andrea Deed, RN. 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 There was Yu Excisional Skin/Subcutaneous Tissue Debridement with Yu total area of 1.22 sq cm performed by Duanne Guess, MD. With the following instrument(s): Curette to remove Viable and Non-Viable tissue/material. Material removed includes Subcutaneous Tissue and Slough and after achieving pain control using Lidocaine 4% T opical Solution. No specimens were taken. Yu time out was conducted at 15:30, prior to the start of the procedure. Yu Minimum amount of bleeding was controlled with Pressure. The procedure was tolerated well with Yu pain level of 1 throughout and Yu pain level of 0 following the procedure. Post Debridement Measurements: 1.3cm length x 1.2cm width x 0.5cm depth; 0.613cm^3 volume. Post debridement Stage noted as Category/Stage III. Character of Wound/Ulcer Post Debridement is stable. Post procedure Diagnosis Wound #4: Same as Pre-Procedure General Notes: scribed for Dr. Lady Gary by Andrea Deed, RN. Plan Follow-up Appointments: Return Appointment in 2 weeks. - Dr. Lady Gary - Room  1 Wed 5/22 @ 12:30 pm Anesthetic: (In clinic) Topical Lidocaine 4% applied to wound bed Bathing/ Shower/ Hygiene: May shower and wash wound with soap and water. Off-Loading: Multipodus Splint to: - prevalon boot to both feet Turn and reposition every 2 hours Home Health: New wound care orders this week; continue Home Health for wound care. May utilize formulary equivalent dressing for wound treatment  orders unless otherwise specified. Dressing changes to be completed by Home Health on Monday / Wednesday / Friday except when patient has scheduled visit at [facility]. Other Home Health Orders/Instructions: - [facility] WOUND #1: - Malleolus Wound Laterality: Right, Medial Cleanser: Normal Saline (Generic) Every Other Day/30 Days Discharge Instructions: Cleanse the wound with Normal Saline prior to applying Yu clean dressing using gauze sponges, not tissue or cotton balls. Cleanser: Byram Ancillary Kit - 15 Day Supply (Generic) Every Other Day/30 Days Discharge Instructions: Use supplies as instructed; Kit contains: (15) Saline Bullets; (15) 3x3 Gauze; 15 pr Gloves Peri-Wound Care: Skin Prep (Generic) Every Other Day/30 Days Discharge Instructions: Use skin prep as directed Topical: Skintegrity Hydrogel 4 (oz) Every Other Day/30 Days Discharge Instructions: Apply hydrogel as directed Prim Dressing: Endoform 2x2 in (Dispense As Written) Every Other Day/30 Days ary Discharge Instructions: Moisten with saline Secondary Dressing: Zetuvit Plus Silicone Border Dressing 4x4 (in/in) (Generic) Every Other Day/30 Days Discharge Instructions: Apply silicone border over primary dressing as directed. WOUND #2: - Gluteus Wound Laterality: Right Cleanser: Normal Saline 1 x Per Day/30 Days Discharge Instructions: Cleanse the wound with Normal Saline prior to applying Yu clean dressing using gauze sponges, not tissue or cotton balls. Peri-Wound Care: Skin Prep (Generic) 1 x Per Day/30 Days Discharge Instructions: Use skin prep as directed Prim Dressing: Dakin's Solution 0.25%, 16 (oz) 1 x Per Day/30 Days ary Discharge Instructions: Moisten gauze with Dakin's solution Secondary Dressing: Zetuvit Plus Silicone Border Dressing 5x5 (in/in) (Dispense As Written) 1 x Per Day/30 Days Discharge Instructions: Apply silicone border over primary dressing as directed. WOUND #4: - Ankle Wound Laterality: Left,  Lateral Cleanser: Normal Saline (Generic) Every Other Day/30 Days Discharge Instructions: Cleanse the wound with Normal Saline prior to applying Yu clean dressing using gauze sponges, not tissue or cotton balls. Cleanser: Byram Ancillary Kit - 15 Day Supply (Generic) Every Other Day/30 Days Discharge Instructions: Use supplies as instructed; Kit contains: (15) Saline Bullets; (15) 3x3 Gauze; 15 pr Gloves Peri-Wound Care: Skin Prep (Generic) Every Other Day/30 Days Discharge Instructions: Use skin prep as directed Topical: Skintegrity Hydrogel 4 (oz) Every Other Day/30 Days Discharge Instructions: Apply hydrogel as directed Prim Dressing: Endoform 2x2 in (Dispense As Written) Every Other Day/30 Days ary Discharge Instructions: Moisten with saline Secondary Dressing: Drawtex 4x4 in (Dispense As Written) Every Other Day/30 Days Discharge Instructions: Apply over primary dressing cut to fit inside wound edges Secondary Dressing: Zetuvit Plus Silicone Border Dressing 4x4 (in/in) (Generic) Every Other Day/30 Days Discharge Instructions: Apply silicone border over primary dressing as directed. 05/31/2022: The gluteal ulcer is shallower and no longer has any dips or crevices. The odor has abated completely. The right medial ankle wound is unchanged. The left lateral ankle wound looks quite Yu bit healthier with Yu cleaner surface and the beginnings of granulation tissue emergence. Andrea Yu, Andrea Yu ([id number]) [id number] Page 10 of 12 I used Yu curette to debride slough from the gluteal ulcer, slough from the right medial ankle wound, and slough and subcutaneous tissue from the left lateral ankle wound. We will continue to pack the  gluteal wound with Dakin's-moistened Kerlix. Continue endoform to both ankles but rather than just saline, we need to add some hydrogel for better moisture balance in the wounds. Follow-up in 2 weeks. Electronic Signature(s) Signed: 05/31/2022 5:06:07 PM  By: Andrea Deed RN, BSN Signed: 06/01/2022 7:51:12 AM By: Duanne Guess MD FACS Previous Signature: 05/31/2022 3:48:58 PM Version By: Duanne Guess MD FACS Entered By: Andrea Yu on 05/31/2022 16:12:03 -------------------------------------------------------------------------------- HxROS Details Patient Name: Date of Service: Andrea Yu 05/31/2022 2:45 PM Medical Record Number: [id number] Patient Account Number: [id number] Date of Birth/Sex: Treating RN: 24-Feb-1940 (81 y.o. F) Primary Care Provider: Jarome Matin Other Clinician: Referring Provider: Treating Provider/Extender: Marena Chancy in Treatment: 26 Information Obtained From Patient Caregiver Chart Constitutional Symptoms (General Health) Medical History: Past Medical History Notes: morbid obesity Eyes Medical History: Positive for: Cataracts - bil extracted Negative for: Glaucoma; Optic Neuritis Ear/Nose/Mouth/Throat Medical History: Past Medical History Notes: hard of hearing Cardiovascular Medical History: Positive for: Arrhythmia - afib; Hypertension Gastrointestinal Medical History: Past Medical History Notes: GERD Endocrine Medical History: Negative for: Type I Diabetes; Type II Diabetes Past Medical History Notes: adrenal insufficiency Genitourinary Medical History: Negative for: End Stage Renal Disease Integumentary (Skin) Medical History: Negative for: History of Burn Musculoskeletal Medical History: Positive for: Osteoarthritis; Osteomyelitis - pelvic Past Medical History NotesKALYANA, Andrea Yu ([id number]) [id number] Page 11 of 12 psoriatic arthritis, thoracic discitis, displaced spiral fx right tibia Neurologic Medical History: Positive for: Neuropathy Past Medical History Notes: hx [facility] Oncologic Medical History: Negative for: Received Chemotherapy; Received Radiation Psychiatric Medical History: Positive for:  Confinement Anxiety Negative for: Anorexia/bulimia HBO Extended History Items Eyes: Cataracts Immunizations Pneumococcal Vaccine: Received Pneumococcal Vaccination: Yes Received Pneumococcal Vaccination On or After 60th Birthday: Yes Implantable Devices None Hospitalization / Surgery History Type of Hospitalization/Surgery TEE right ankle fusion bil knee replacements bil cataract extractions posterior lumbar fusion vaginal hysterectomy cholecystectomy Family and Social History Cancer: Yes - Father,Maternal Grandparents,Siblings,Child; Diabetes: No; Heart Disease: Yes - Father; Hereditary Spherocytosis: No; Hypertension: Yes - Father; Kidney Disease: No; Lung Disease: No; Seizures: No; Stroke: No; Thyroid Problems: Yes - Mother; Tuberculosis: No; Former smoker - quit 40 yr ago; Marital Status - Widowed; Alcohol Use: Never; Drug Use: No History; Caffeine Use: Daily - tea, soda; Financial Concerns: No; Food, Clothing or Shelter Needs: No; Support System Lacking: No; Transportation Concerns: No Electronic Signature(s) Signed: 05/31/2022 3:49:45 PM By: Duanne Guess MD FACS Entered By: Duanne Guess on 05/31/2022 15:45:16 -------------------------------------------------------------------------------- SuperBill Details Patient Name: Date of Service: Andrea Yu 05/31/2022 Medical Record Number: [id number] Patient Account Number: [id number] Date of Birth/Sex: Treating RN: 1940/06/28 (81 y.o. 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 L02.31 Cutaneous abscess of buttock IVERSON, BEILFUSS Yu ([id number])  [id number] Page 12 of 12 E27.40 Unspecified adrenocortical insufficiency Z79.52 Long term (current) use of systemic steroids I10 Essential (primary) hypertension Facility Procedures : CPT4 Code: 41324401 Description: 11042 - DEB SUBQ TISSUE 20 SQ CM/< ICD-10 Diagnosis Description L89.523 Pressure ulcer of left ankle, stage 3 Modifier: Quantity: 1 : CPT4 Code: [id number] Description: 97597 - DEBRIDE WOUND 1ST 20 SQ CM OR < ICD-10 Diagnosis Description 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, stage 3 L98.415 Non-pressure chronic  ulcer of buttock with muscle involvement without evidence Modifier: of necrosis Quantity: 1 Physician Procedures : CPT4 Code Description Modifier [id number] 99214 - WC PHYS LEVEL 4 - EST PT 25 ICD-10 Diagnosis Description 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, stage 3 L89.523 Pressure ulcer of left ankle, stage 3 L98.415 Non-pressure chronic ulcer of buttock with  muscle involvement without evidence of necrosis Z79.52 Long term (current) use of systemic steroids Quantity: 1 : [id number] 11042 - WC PHYS SUBQ TISS 20 SQ CM ICD-10 Diagnosis Description L89.523 Pressure ulcer of left ankle, stage 3 Quantity: 1 : 1914782 97597 - WC PHYS DEBR WO ANESTH 20 SQ CM ICD-10 Diagnosis Description 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, stage 3 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

## 2022-06-01 NOTE — Progress Notes (Signed)
SHONETTE, CAVALCANTE A ([id number]) [id number] Page 1 of 10 Visit Report for 05/31/2022 Arrival Information Details Patient Name: Date of Service: Andrea Yu 05/31/2022 2:45 PM Medical Record Number: [id number] Patient Account Number: [id number] Date of Birth/Sex: Treating RN: 1941-01-01 (82 y.o. Katrinka Blazing Primary Care Tessica Cupo: Jarome Matin Other Clinician: Referring Santia Labate: Treating Leontina Skidmore/Extender: Marena Chancy in Treatment: 26 Visit Information History Since Last Visit Added or deleted any medications: No Patient Arrived: Wheel Chair Any new allergies or adverse reactions: No Arrival Time: 15:11 Had a fall or experienced change in No Accompanied By: daughter Metta Clines activities of daily living that may affect Transfer Assistance: Manual risk of falls: Patient Identification Verified: Yes Signs or symptoms of abuse/neglect since last visito No Patient Requires Transmission-Based Precautions: No Hospitalized since last visit: No Patient Has Alerts: No Implantable device outside of the clinic excluding No cellular tissue based products placed in the center since last visit: Has Dressing in Place as Prescribed: Yes Pain Present Now: Yes Electronic Signature(s) Signed: 05/31/2022 4:31:42 PM By: Karie Schwalbe RN Entered By: Karie Schwalbe on 05/31/2022 15:12:24 -------------------------------------------------------------------------------- Encounter Discharge Information Details Patient Name: Date of Service: Andrea Yu 05/31/2022 2:45 PM Medical Record Number: [id number] Patient Account Number: [id number] Date of Birth/Sex: Treating RN: 09-Apr-1940 (81 y.o. [name] Primary Care Shaarav Ripple: Jarome Matin Other Clinician: Referring Aerionna Moravek: Treating Cleve Paolillo/Extender: Marena Chancy in Treatment: 26 Encounter Discharge Information Items Post Procedure  Vitals Discharge Condition: Stable Temperature (F): 98.4 Ambulatory Status: Wheelchair Pulse (bpm): 82 Discharge Destination: Home Respiratory Rate (breaths/min): 18 Transportation: Private Auto Blood Pressure (mmHg): 152/76 Accompanied By: daughters Schedule Follow-up Appointment: Yes Clinical Summary of Care: Patient Declined Electronic Signature(s) Signed: 05/31/2022 5:06:07 PM By: Zenaida Deed RN, BSN Entered By: Zenaida Deed on 05/31/2022 16:13:02 -------------------------------------------------------------------------------- Lower Extremity Assessment Details Patient Name: Date of Service: Andrea Yu 05/31/2022 2:45 PM Medical Record Number: [id number] Patient Account Number: [id number] Date of Birth/Sex: Treating RN: [birth year] (82 y.o. Katrinka Blazing Primary Care Vern Guerette: Jarome Matin Other Clinician: Referring Hollye Pritt: Treating Lakeith Careaga/Extender: Marena Chancy in Treatment: 26 Edema Assessment Assessed: Kyra Searles: No] Franne Forts: No] T[LeftTASHANNA, ARNSON ([id number])] [Right: [id number] Page 2 of 10] Edema: [Left: No] [Right: No] Calf Left: Right: Point of Measurement: From Medial Instep 31.5 cm 32 cm Ankle Left: Right: Point of Measurement: From Medial Instep 17.8 cm 20.4 cm Vascular Assessment Pulses: Dorsalis Pedis Palpable: [Left:Yes] [Right:Yes] Electronic Signature(s) Signed: 05/31/2022 4:31:42 PM By: Karie Schwalbe RN Entered By: Karie Schwalbe on 05/31/2022 15:23:36 -------------------------------------------------------------------------------- Multi Wound Chart Details Patient Name: Date of Service: Andrea Yu 05/31/2022 2:45 PM Medical Record Number: [id number] Patient Account Number: [id number] Date of Birth/Sex: Treating RN: 08-04-1940 (81 y.o. F) Primary Care Javontae Marlette: Jarome Matin Other Clinician: Referring Evolett Somarriba: Treating Cleora Karnik/Extender: Marena Chancy in Treatment: 26 Vital Signs Height(in): 59 Pulse(bpm): 82 Weight(lbs): 155 Blood Pressure(mmHg): 152/76 Body Mass Index(BMI): 31.3 Temperature(F): 98.4 Respiratory Rate(breaths/min): 18 [1:Photos: No Photos Right, Medial Malleolus Wound Location: Pressure Injury Wounding Event: Pressure Ulcer Primary Etiology: Cataracts, Arrhythmia, Hypertension, Cataracts, Arrhythmia, Hypertension, Cataracts, Arrhythmia, Hypertension, Comorbid History:  Osteoarthritis, Osteomyelitis, Neuropathy, Confinement Anxiety 11/16/2021 Date Acquired: 26 Weeks of Treatment: Open Wound Status: No Wound Recurrence: 1.7x1.5x0.2 Measurements L x W x D (cm) 2.003 A (cm) : rea 0.401 Volume (cm) : -30.10% % Reduction  in A rea: -160.40% % Reduction in Volume: Starting Position 1 (o'clock): Ending Position 1 (  o'clock): Maximum Distance 1 (cm): No Undermining: Category/Stage III Classification: Medium Exudate Amount: Serosanguineous Exudate Type: red, brown Exudate  Color: Distinct, outline attached Wound Margin: Large (67-100%) Granulation Amount: Red, Pink Granulation Quality: Small (1-33%) Necrotic Amount: Fat Layer (Subcutaneous Tissue): Yes Fat Layer (Subcutaneous Tissue): Yes Fat Layer (Subcutaneous Tissue):  Yes Exposed Structures: Fascia: No Tendon: No Muscle: No Joint: No Bone: No] [2:No Photos Right Gluteus Bump Abscess Osteoarthritis, Osteomyelitis, Neuropathy, Confinement Anxiety 09/28/2021 26 Open No 0.9x2.2x4.2 1.555 6.531 85.10% 79.20% 12 9 3  Yes Full  Thickness With Exposed Support Category/Stage III Structures Medium Serous amber Well defined, not attached Medium (34-66%) Pink, Pale Medium (34-66%) Fascia: No Tendon: No Muscle: No Joint: No Bone: No] [4:No Photos Left, Lateral Ankle Pressure Injury  Pressure Ulcer Osteoarthritis, Osteomyelitis, Neuropathy, Confinement Anxiety 12/23/2021 21 Open No 1.3x1.2x0.5 1.225 0.613 -768.80% -4278.60% 6 12 0.3 Yes Medium Serosanguineous red,  brown Distinct, outline attached Large (67-100%) Red Small (1-33%)  Fascia: No Tendon: No Muscle: No Joint: No Bone: No] SHANDRIKA, ARISPE ([id number]) [1:None Epithelialization: Debridement - Selective/Open Wound Debridement - Selective/Open Wound Debridement - Excisional Debridement: 15:30 Pre-procedure Verification/Time Out Taken: Lidocaine 4% Topical Solution Pain Control: Slough Tissue Debrided:  Non-Viable Tissue Level: 2 Debridement A (sq cm): rea Curette Instrument: Minimum Bleeding: Pressure Hemostasis A chieved: 1 Procedural Pain: 0 Post Procedural Pain: Procedure was tolerated well Debridement Treatment Response: 1.7x1.5x0.2 Post  Debridement Measurements L x W x D (cm) 0.401 Post Debridement Volume: (cm) Category/Stage III Post Debridement Stage: No Abnormalities Noted Periwound Skin Texture: No Abnormalities Noted Periwound Skin Moisture: Erythema: Yes Periwound Skin Color:  Measured: 1cm Erythema Measurement: No Change Erythema Change: No Abnormality Temperature: Debridement Procedures Performed:] [2:None 15:30 N/A Slough Non-Viable Tissue 1.55 Curette Minimum Pressure 1 0 Procedure was tolerated well 0.9x2.2x4.2 6.531 N/A  No Abnormalities Noted No Abnormalities Noted Erythema: No N/A N/A No Abnormality Debridement] [4:[id number] Page 3 of 10 Small (1-33%) 15:30 Lidocaine 4% Topical Solution Subcutaneous, Slough Skin/Subcutaneous Tissue 1.22  Curette Minimum Pressure 1 0 Procedure was tolerated well 1.3x1.2x0.5 0.613 Category/Stage III No Abnormalities Noted No Abnormalities Noted Rubor: Yes N/A N/A No Abnormality Debridement] Treatment Notes Electronic Signature(s) Signed: 05/31/2022 3:43:14 PM By: Duanne Guess MD FACS Entered By: Duanne Guess on 05/31/2022 15:43:14 -------------------------------------------------------------------------------- Multi-Disciplinary Care Plan Details Patient Name: Date of Service: Andrea Yu 05/31/2022 2:45 PM Medical  Record Number: [id number] Patient Account Number: [id number] Date of Birth/Sex: Treating RN: 08-Mar-1940 (81 y.o. Katrinka Blazing Primary Care Nyesha Cliff: Jarome Matin Other Clinician: Referring Tonyetta Berko: Treating Aaren Atallah/Extender: Marena Chancy in Treatment: 26 Multidisciplinary Care Plan reviewed with physician Active Inactive Pressure Nursing Diagnoses: Knowledge deficit related to causes and risk factors for pressure ulcer development Knowledge deficit related to management of pressures ulcers Potential for impaired tissue integrity related to pressure, friction, moisture, and shear Goals: Patient/caregiver will verbalize understanding of pressure ulcer management Date Initiated: 11/24/2021 Target Resolution Date: 06/21/2022 Goal Status: Active Interventions: Assess: immobility, friction, shearing, incontinence upon admission and as needed Assess offloading mechanisms upon admission and as needed Assess potential for pressure ulcer upon admission and as needed Provide education on pressure ulcers Treatment Activities: Pressure reduction/relief device ordered : 11/24/2021 Notes: Soft Tissue Infection Nursing Diagnoses: Impaired tissue integrity DENISSE, LEHR A ([id number]) [id number] Page 4 of 10 Potential for infection: soft tissue Goals: Patient's soft tissue infection will resolve Date Initiated: 11/24/2021 Target Resolution Date: 06/21/2022 Goal Status: Active Signs and symptoms of infection will be recognized early to allow  for prompt treatment Date Initiated: 11/24/2021 Date Inactivated: 01/01/2022 Target Resolution Date: 12/22/2021 Goal Status: Met Interventions: Assess signs and symptoms of infection every visit Provide education on infection Treatment Activities: Culture : 11/24/2021 Culture and sensitivity : 11/24/2021 Education provided on Infection : 12/04/2021 Systemic antibiotics :  11/24/2021 Notes: Wound/Skin Impairment Nursing Diagnoses: Impaired tissue integrity Knowledge deficit related to ulceration/compromised skin integrity Goals: Patient/caregiver will verbalize understanding of skin care regimen Date Initiated: 11/24/2021 Target Resolution Date: 06/21/2022 Goal Status: Active Ulcer/skin breakdown will have a volume reduction of 30% by week 4 Date Initiated: 11/24/2021 Date Inactivated: 05/31/2022 Target Resolution Date: 05/25/2022 Unmet Reason: refuses VAC, Goal Status: Unmet noncompliant with offloading Interventions: Assess patient/caregiver ability to obtain necessary supplies Assess patient/caregiver ability to perform ulcer/skin care regimen upon admission and as needed Assess ulceration(s) every visit Provide education on ulcer and skin care Treatment Activities: Skin care regimen initiated : 11/24/2021 Topical wound management initiated : 11/24/2021 Notes: Electronic Signature(s) Signed: 05/31/2022 4:31:42 PM By: Karie Schwalbe RN Entered By: Karie Schwalbe on 05/31/2022 15:26:17 -------------------------------------------------------------------------------- Pain Assessment Details Patient Name: Date of Service: Andrea Yu 05/31/2022 2:45 PM Medical Record Number: [id number] Patient Account Number: [id number] Date of Birth/Sex: Treating RN: April 30, 1940 (82 y.o. Katrinka Blazing Primary Care Travian Kerner: Jarome Matin Other Clinician: Referring Precious Gilchrest: Treating Tayte Mcwherter/Extender: Marena Chancy in Treatment: 26 Active Problems Location of Pain Severity and Description of Pain Patient Has Paino Yes Site Locations Pain Location: HAYA, RINDAL ([id number]) [phone].pdf Page 5 of 10 Pain Location: Generalized Pain With Dressing Change: No Duration of the Pain. Constant / Intermittento Constant Rate the pain. Current Pain Level: 6 Worst Pain Level: 10 Least Pain Level:  3 Tolerable Pain Level: 2 Character of Pain Describe the Pain: Difficult to Pinpoint Pain Management and Medication Current Pain Management: Medication: Yes Cold Application: No Rest: Yes Massage: No Activity: No T.E.N.S.: No Heat Application: No Leg drop or elevation: No Is the Current Pain Management Adequate: Adequate How does your wound impact your activities of daily livingo Sleep: No Bathing: No Appetite: No Relationship With Others: No Bladder Continence: No Emotions: No Bowel Continence: No Work: No Toileting: No Drive: No Dressing: No Hobbies: No Electronic Signature(s) Signed: 05/31/2022 4:31:42 PM By: Karie Schwalbe RN Entered By: Karie Schwalbe on 05/31/2022 15:13:25 -------------------------------------------------------------------------------- Patient/Caregiver Education Details Patient Name: Date of Service: Andrea Yu 5/6/2024andnbsp2:45 PM Medical Record Number: [id number] Patient Account Number: [id number] Date of Birth/Gender: Treating RN: [birth year] (82 y.o. Katrinka Blazing Primary Care Physician: Jarome Matin Other Clinician: Referring Physician: Treating Physician/Extender: Marena Chancy in Treatment: 26 Education Assessment Education Provided To: Patient Education Topics Provided Pressure: Methods: Explain/Verbal Responses: Reinforcements needed, State content correctly Wound/Skin Impairment: Methods: Explain/Verbal Responses: Reinforcements needed, State content correctly Electronic Signature(s) Signed: 05/31/2022 4:31:42 PM By: Karie Schwalbe RN Entered By: Karie Schwalbe on 05/31/2022 15:26:38 [name] ([id number]) [id number] Page 6 of 10 -------------------------------------------------------------------------------- Wound Assessment Details Patient Name: Date of Service: Andrea Yu 05/31/2022 2:45 PM Medical Record Number: [id number] Patient Account  Number: [id number] Date of Birth/Sex: Treating RN: [birth year] (82 y.o. Katrinka Blazing Primary Care Addyson Traub: Jarome Matin Other Clinician: Referring Hailei Besser: Treating Alara Daniel/Extender: Marena Chancy in Treatment: 26 Wound Status Wound Number: 1 Primary Pressure Ulcer Etiology: Wound Location: Right, Medial Malleolus Wound Open Wounding Event: Pressure Injury Status: Date Acquired: 11/16/2021 Comorbid Cataracts, Arrhythmia, Hypertension, Osteoarthritis, Weeks Of Treatment: 26 History: Osteomyelitis, Neuropathy, Confinement Anxiety Clustered  Wound: No Photos Wound Measurements Length: (cm) 1.7 Width: (cm) 1.5 Depth: (cm) 0.2 Area: (cm) 2.003 Volume: (cm) 0.401 % Reduction in Area: -30.1% % Reduction in Volume: -160.4% Epithelialization: None Tunneling: No Undermining: No Wound Description Classification: Category/Stage III Wound Margin: Distinct, outline attached Exudate Amount: Medium Exudate Type: Serosanguineous Exudate Color: red, brown Foul Odor After Cleansing: No Slough/Fibrino Yes Wound Bed Granulation Amount: Large (67-100%) Exposed Structure Granulation Quality: Red, Pink Fascia Exposed: No Necrotic Amount: Small (1-33%) Fat Layer (Subcutaneous Tissue) Exposed: Yes Necrotic Quality: Adherent Slough Tendon Exposed: No Muscle Exposed: No Joint Exposed: No Bone Exposed: No Periwound Skin Texture Texture Color No Abnormalities Noted: Yes No Abnormalities Noted: No Erythema: Yes Moisture Erythema Measurement: Measured No Abnormalities Noted: Yes 1 cm Erythema Change: No Change Temperature / Pain Temperature: No Abnormality Treatment Notes Wound #1 (Malleolus) Wound Laterality: Right, Medial Cleanser Normal Saline NAOKO, COPPA A ([id number]) [id number] Page 7 of 10 Discharge Instruction: Cleanse the wound with Normal Saline prior to applying a clean dressing using gauze sponges, not  tissue or cotton balls. Byram Ancillary Kit - 15 Day Supply Discharge Instruction: Use supplies as instructed; Kit contains: (15) Saline Bullets; (15) 3x3 Gauze; 15 pr Gloves Peri-Wound Care Skin Prep Discharge Instruction: Use skin prep as directed Topical Skintegrity Hydrogel 4 (oz) Discharge Instruction: Apply hydrogel as directed Primary Dressing Endoform 2x2 in Discharge Instruction: Moisten with saline Secondary Dressing Zetuvit Plus Silicone Border Dressing 4x4 (in/in) Discharge Instruction: Apply silicone border over primary dressing as directed. Secured With Compression Wrap Compression Stockings Facilities manager) Signed: 05/31/2022 4:31:42 PM By: Karie Schwalbe RN Signed: 05/31/2022 5:06:07 PM By: Zenaida Deed RN, BSN Entered By: Zenaida Deed on 05/31/2022 16:10:16 -------------------------------------------------------------------------------- Wound Assessment Details Patient Name: Date of Service: Andrea Yu 05/31/2022 2:45 PM Medical Record Number: [id number] Patient Account Number: [id number] Date of Birth/Sex: Treating RN: 04-08-1940 (81 y.o. Katrinka Blazing Primary Care Baily Serpe: Jarome Matin Other Clinician: Referring Dorathea Faerber: Treating Jonnelle Lawniczak/Extender: Marena Chancy in Treatment: 26 Wound Status Wound Number: 2 Primary Abscess Etiology: Wound Location: Right Gluteus Wound Open Wounding Event: Bump Status: Date Acquired: 09/28/2021 Comorbid Cataracts, Arrhythmia, Hypertension, Osteoarthritis, Weeks Of Treatment: 26 History: Osteomyelitis, Neuropathy, Confinement Anxiety Clustered Wound: No Photos Wound Measurements Length: (cm) 0.9 Width: (cm) 2.2 Depth: (cm) 4.2 Area: (cm) 1.555 Volume: (cm) 6.531 Lemmons, Keenya A ([id number]) % Reduction in Area: 85.1% % Reduction in Volume: 79.2% Epithelialization: None Tunneling: No Undermining: Yes Starting Position (o'clock): 12 Ending Position  (o'clock): 9 [id number] Page 8 of 10 Maximum Distance: (cm) 3 Wound Description Classification: Full Thickness With Exposed Support Structures Wound Margin: Well defined, not attached Exudate Amount: Medium Exudate Type: Serous Exudate Color: amber Foul Odor After Cleansing: No Slough/Fibrino Yes Wound Bed Granulation Amount: Medium (34-66%) Exposed Structure Granulation Quality: Pink, Pale Fascia Exposed: No Necrotic Amount: Medium (34-66%) Fat Layer (Subcutaneous Tissue) Exposed: Yes Necrotic Quality: Adherent Slough Tendon Exposed: No Muscle Exposed: No Joint Exposed: No Bone Exposed: No Periwound Skin Texture Texture Color No Abnormalities Noted: Yes No Abnormalities Noted: Yes Moisture Temperature / Pain No Abnormalities Noted: Yes Temperature: No Abnormality Treatment Notes Wound #2 (Gluteus) Wound Laterality: Right Cleanser Normal Saline Discharge Instruction: Cleanse the wound with Normal Saline prior to applying a clean dressing using gauze sponges, not tissue or cotton balls. Peri-Wound Care Skin Prep Discharge Instruction: Use skin prep as directed Topical Primary Dressing Dakin's Solution 0.25%, 16 (oz) Discharge Instruction: Moisten gauze with Dakin's solution Secondary Dressing Zetuvit Plus Silicone  Border Dressing 5x5 (in/in) Discharge Instruction: Apply silicone border over primary dressing as directed. Secured With Compression Wrap Compression Stockings Facilities manager) Signed: 05/31/2022 4:31:42 PM By: Karie Schwalbe RN Signed: 05/31/2022 5:06:07 PM By: Zenaida Deed RN, BSN Entered By: Zenaida Deed on 05/31/2022 16:11:00 -------------------------------------------------------------------------------- Wound Assessment Details Patient Name: Date of Service: Andrea Yu 05/31/2022 2:45 PM Medical Record Number: [id number] Patient Account Number: [id number] Date of Birth/Sex: Treating  RN: Jul 08, 1940 (81 y.o. Katrinka Blazing Primary Care Gorden Stthomas: Jarome Matin Other Clinician: Referring Zalma Channing: Treating Luanna Weesner/Extender: Marena Chancy in Treatment: 26 Wound Status Wound Number: 4 Primary Pressure Ulcer Etiology: JAKYLAH, PANZERA A ([id number]) [id number] Page 9 of 10 Etiology: Wound Location: Left, Lateral Ankle Wound Open Wounding Event: Pressure Injury Status: Date Acquired: 12/23/2021 Comorbid Cataracts, Arrhythmia, Hypertension, Osteoarthritis, Weeks Of Treatment: 21 History: Osteomyelitis, Neuropathy, Confinement Anxiety Clustered Wound: No Photos Wound Measurements Length: (cm) 1.3 Width: (cm) 1.2 Depth: (cm) 0.5 Area: (cm) 1.225 Volume: (cm) 0.613 % Reduction in Area: -768.8% % Reduction in Volume: -4278.6% Epithelialization: Small (1-33%) Tunneling: No Undermining: Yes Starting Position (o'clock): 6 Ending Position (o'clock): 12 Maximum Distance: (cm) 0.3 Wound Description Classification: Category/Stage III Wound Margin: Distinct, outline attached Exudate Amount: Medium Exudate Type: Serosanguineous Exudate Color: red, brown Foul Odor After Cleansing: No Slough/Fibrino Yes Wound Bed Granulation Amount: Large (67-100%) Exposed Structure Granulation Quality: Red Fascia Exposed: No Necrotic Amount: Small (1-33%) Fat Layer (Subcutaneous Tissue) Exposed: Yes Necrotic Quality: Adherent Slough Tendon Exposed: No Muscle Exposed: No Joint Exposed: No Bone Exposed: No Periwound Skin Texture Texture Color No Abnormalities Noted: Yes No Abnormalities Noted: Yes Moisture Temperature / Pain No Abnormalities Noted: Yes Temperature: No Abnormality Treatment Notes Wound #4 (Ankle) Wound Laterality: Left, Lateral Cleanser Normal Saline Discharge Instruction: Cleanse the wound with Normal Saline prior to applying a clean dressing using gauze sponges, not tissue or cotton balls. Byram  Ancillary Kit - 15 Day Supply Discharge Instruction: Use supplies as instructed; Kit contains: (15) Saline Bullets; (15) 3x3 Gauze; 15 pr Gloves Peri-Wound Care Skin Prep Discharge Instruction: Use skin prep as directed Topical Skintegrity Hydrogel 4 (oz) Discharge Instruction: Apply hydrogel as directed Primary Dressing Endoform 2x2 in ARYAHNA, PASSI A ([id number]) [id number] Page 10 of 10 Discharge Instruction: Moisten with saline Secondary Dressing Drawtex 4x4 in Discharge Instruction: Apply over primary dressing cut to fit inside wound edges Zetuvit Plus Silicone Border Dressing 4x4 (in/in) Discharge Instruction: Apply silicone border over primary dressing as directed. Secured With Compression Wrap Compression Stockings Facilities manager) Signed: 05/31/2022 4:31:42 PM By: Karie Schwalbe RN Signed: 05/31/2022 5:06:07 PM By: Zenaida Deed RN, BSN Entered By: Zenaida Deed on 05/31/2022 16:11:43 -------------------------------------------------------------------------------- Vitals Details Patient Name: Date of Service: Andrea Yu 05/31/2022 2:45 PM Medical Record Number: [id number] Patient Account Number: [id number] Date of Birth/Sex: Treating RN: 12/12/1940 (81 y.o. Katrinka Blazing Primary Care Terresa Marlett: Jarome Matin Other Clinician: Referring Cayleigh Paull: Treating Joelene Barriere/Extender: Marena Chancy in Treatment: 26 Vital Signs Time Taken: 14:12 Temperature (F): 98.4 Height (in): 59 Pulse (bpm): 82 Weight (lbs): 155 Respiratory Rate (breaths/min): 18 Body Mass Index (BMI): 31.3 Blood Pressure (mmHg): 152/76 Reference Range: 80 - 120 mg / dl Electronic Signature(s) Signed: 05/31/2022 4:31:42 PM By: Karie Schwalbe RN Entered By: Karie Schwalbe on 05/31/2022 15:12:53

## 2022-06-16 ENCOUNTER — Encounter (HOSPITAL_BASED_OUTPATIENT_CLINIC_OR_DEPARTMENT_OTHER): Payer: Medicare Other | Admitting: General Surgery

## 2022-06-16 DIAGNOSIS — L89523 Pressure ulcer of left ankle, stage 3: Secondary | ICD-10-CM | POA: Diagnosis not present

## 2022-06-18 ENCOUNTER — Other Ambulatory Visit: Payer: Self-pay | Admitting: Urology

## 2022-06-23 ENCOUNTER — Other Ambulatory Visit: Payer: Self-pay | Admitting: Cardiovascular Disease

## 2022-06-30 ENCOUNTER — Encounter (HOSPITAL_BASED_OUTPATIENT_CLINIC_OR_DEPARTMENT_OTHER): Payer: Medicare Other | Admitting: General Surgery

## 2022-07-12 ENCOUNTER — Encounter (HOSPITAL_BASED_OUTPATIENT_CLINIC_OR_DEPARTMENT_OTHER): Payer: Medicare Other | Attending: General Surgery | Admitting: General Surgery

## 2022-07-12 DIAGNOSIS — I10 Essential (primary) hypertension: Secondary | ICD-10-CM | POA: Diagnosis not present

## 2022-07-12 DIAGNOSIS — L98415 Non-pressure chronic ulcer of buttock with muscle involvement without evidence of necrosis: Secondary | ICD-10-CM | POA: Insufficient documentation

## 2022-07-12 DIAGNOSIS — L97812 Non-pressure chronic ulcer of other part of right lower leg with fat layer exposed: Secondary | ICD-10-CM | POA: Diagnosis not present

## 2022-07-12 DIAGNOSIS — L89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, stage 3: Secondary | ICD-10-CM | POA: Diagnosis not present

## 2022-07-12 DIAGNOSIS — I4891 Unspecified atrial fibrillation: Secondary | ICD-10-CM | POA: Diagnosis not present

## 2022-07-12 DIAGNOSIS — Z96653 Presence of artificial knee joint, bilateral: Secondary | ICD-10-CM | POA: Diagnosis not present

## 2022-07-12 DIAGNOSIS — E2740 Unspecified adrenocortical insufficiency: Secondary | ICD-10-CM | POA: Insufficient documentation

## 2022-07-12 DIAGNOSIS — G629 Polyneuropathy, unspecified: Secondary | ICD-10-CM | POA: Insufficient documentation

## 2022-07-12 DIAGNOSIS — M1990 Unspecified osteoarthritis, unspecified site: Secondary | ICD-10-CM | POA: Diagnosis not present

## 2022-07-12 DIAGNOSIS — L97511 Non-pressure chronic ulcer of other part of right foot limited to breakdown of skin: Secondary | ICD-10-CM | POA: Diagnosis not present

## 2022-07-12 DIAGNOSIS — L89523 Pressure ulcer of left ankle, stage 3: Secondary | ICD-10-CM | POA: Insufficient documentation

## 2022-07-12 DIAGNOSIS — Z981 Arthrodesis status: Secondary | ICD-10-CM | POA: Diagnosis not present

## 2022-07-12 NOTE — Progress Notes (Signed)
STARLITE, BADEN Yu ([id number]) [id number] Page 1 of 10 Visit Report for 07/12/2022 Arrival Information Details Patient Name: Date of Service: Andrea Yu 07/12/2022 1:15 PM Medical Record Number: [id number] Patient Account Number: [id number] Date of Birth/Sex: Treating RN: 1940/10/01 (82 y.o. F) Primary Care Andrea Yu: Andrea Yu Other Clinician: Referring Andrea Yu: Treating Andrea Yu/Extender: Andrea Yu in Treatment: 32 Visit Information History Since Last Visit All ordered tests and consults were completed: No Patient Arrived: Wheel Chair Added or deleted any medications: No Arrival Time: 13:12 Any new allergies or adverse reactions: No Accompanied By: aide Had Yu fall or experienced change in No Transfer Assistance: None activities of daily living that may affect Patient Identification Verified: Yes risk of falls: Secondary Verification Process Completed: Yes Signs or symptoms of abuse/neglect since last visito No Patient Requires Transmission-Based Precautions: No Hospitalized since last visit: No Patient Has Alerts: No Implantable device outside of the clinic excluding No cellular tissue based products placed in the center since last visit: Has Dressing in Place as Prescribed: Yes Pain Present Now: No Electronic Signature(s) Signed: 07/12/2022 5:12:02 PM By: Andrea Deed RN, BSN Entered By: Andrea Yu on 07/12/2022 13:29:07 -------------------------------------------------------------------------------- Encounter Discharge Information Details Patient Name: Date of Service: Andrea Yu 07/12/2022 1:15 PM Medical Record Number: [id number] Patient Account Number: [id number] Date of Birth/Sex: Treating RN: [birth year] (81 y.o. [name] Primary Care Andrea Yu: Andrea Yu Other Clinician: Referring Andrea Yu: Treating Andrea Yu/Extender: Andrea Yu in  Treatment: 32 Encounter Discharge Information Items Post Procedure Vitals Discharge Condition: Stable Temperature (F): 98.2 Ambulatory Status: Wheelchair Pulse (bpm): 73 Discharge Destination: Home Respiratory Rate (breaths/min): 18 Transportation: Private Auto Blood Pressure (mmHg): 120/66 Accompanied By: caregiver Schedule Follow-up Appointment: Yes Clinical Summary of Care: Patient Declined Electronic Signature(s) Signed: 07/12/2022 5:12:02 PM By: Andrea Deed RN, BSN Entered By: Andrea Yu on 07/12/2022 16:18:33 Andrea Yu ([id number]) [id number] Page 2 of 10 -------------------------------------------------------------------------------- Lower Extremity Assessment Details Patient Name: Date of Service: Andrea Yu 07/12/2022 1:15 PM Medical Record Number: [id number] Patient Account Number: [id number] Date of Birth/Sex: Treating RN: 01-Dec-1940 (82 y.o. [name] Primary Care Andrea Yu: Andrea Yu Other Clinician: Referring Andrea Yu: Treating Andrea Yu/Extender: Andrea Yu in Treatment: 32 Edema Assessment Assessed: [Left: No] [Right: No] Edema: [Left: No] [Right: No] Calf Left: Right: Point of Measurement: From Medial Instep 30 cm 35 cm Ankle Left: Right: Point of Measurement: From Medial Instep 18.5 cm 21.4 cm Vascular Assessment Pulses: Dorsalis Pedis Palpable: [Left:Yes] [Right:Yes] Electronic Signature(s) Signed: 07/12/2022 5:12:02 PM By: Andrea Deed RN, BSN Entered By: Andrea Yu on 07/12/2022 13:31:36 -------------------------------------------------------------------------------- Multi Wound Chart Details Patient Name: Date of Service: Andrea Yu 07/12/2022 1:15 PM Medical Record Number: [id number] Patient Account Number: [id number] Date of Birth/Sex: Treating RN: 03/03/1940 (81 y.o. [name] Primary Care Andrea Yu: Andrea Yu Other  Clinician: Referring Andrea Yu: Treating Andrea Yu/Extender: Andrea Yu in Treatment: 32 Vital Signs Height(in): 59 Pulse(bpm): 73 Weight(lbs): 155 Blood Pressure(mmHg): 120/66 Body Mass Index(BMI): 31.3 Temperature(F): 98.2 Respiratory Rate(breaths/min): 20 [1:Photos:] [4:[id number] Page 3 of 10] Right, Medial Malleolus Right Gluteus Left, Lateral Ankle Wound Location: Pressure Injury Bump Pressure Injury Wounding Event: Pressure Ulcer Abscess Pressure Ulcer Primary Etiology: Cataracts, Arrhythmia, Hypertension,Cataracts, Arrhythmia, Hypertension, Cataracts, Arrhythmia, Hypertension, Comorbid History: Osteoarthritis, Osteomyelitis, Osteoarthritis, Osteomyelitis, Osteoarthritis, Osteomyelitis, Neuropathy, Confinement Anxiety Neuropathy, Confinement Anxiety Neuropathy, Confinement Anxiety 11/16/2021 09/28/2021 12/23/2021 Date Acquired: 32 32 27 Weeks of Treatment: Open Open Open Wound  Status: No No No Wound Recurrence: 1.3x1.5x0.1 3x1x3.6 1.2x0.9x0.4 Measurements L x W x D (cm) 1.532 2.356 0.848 Yu (cm) : rea 0.153 8.482 0.339 Volume (cm) : 0.50% 77.40% -501.40% % Reduction in Yu rea: 0.60% 72.90% -2321.40% % Reduction in Volume: 2 Position 1 (o'clock): 4.7 Maximum Distance 1 (cm): No Yes No Tunneling: Category/Stage III Full Thickness With Exposed Support Category/Stage IV Classification: Structures Medium Medium Medium Exudate Yu mount: Serosanguineous Serous Serosanguineous Exudate Type: red, brown amber red, brown Exudate Color: Distinct, outline attached Well defined, not attached Distinct, outline attached Wound Margin: Small (1-33%) Large (67-100%) None Present (0%) Granulation Yu mount: Red, Pink Pink, Pale N/Yu Granulation Quality: Large (67-100%) Small (1-33%) Large (67-100%) Necrotic Yu mount: Fat Layer (Subcutaneous Tissue): Yes Fat Layer (Subcutaneous Tissue): Yes Fat Layer (Subcutaneous  Tissue): Yes Exposed Structures: Fascia: No Fascia: No Tendon: Yes Tendon: No Tendon: No Bone: Yes Muscle: No Muscle: No Fascia: No Joint: No Joint: No Muscle: No Bone: No Bone: No Joint: No None None None Epithelialization: Debridement - Selective/Open Wound N/Yu Debridement - Excisional Debridement: Pre-procedure Verification/Time Out 13:30 N/Yu 13:30 Taken: Lidocaine 4% Topical Solution N/Yu Lidocaine 4% Topical Solution Pain Control: N/Yu N/Yu Subcutaneous, Slough Tissue Debrided: Non-Viable Tissue N/Yu Skin/Subcutaneous Tissue Level: 1.53 N/Yu 0.85 Debridement Yu (sq cm): rea Curette N/Yu Curette Instrument: Minimum N/Yu Minimum Bleeding: Pressure N/Yu Pressure Hemostasis Yu chieved: 0 N/Yu 0 Procedural Pain: 0 N/Yu 0 Post Procedural Pain: Procedure was tolerated well N/Yu Procedure was tolerated well Debridement Treatment Response: 1.3x1.5x0.1 N/Yu 1.2x0.9x0.4 Post Debridement Measurements L x W x D (cm) 0.153 N/Yu 0.339 Post Debridement Volume: (cm) Category/Stage III N/Yu Category/Stage IV Post Debridement Stage: No Abnormalities Noted No Abnormalities Noted No Abnormalities Noted Periwound Skin Texture: No Abnormalities Noted No Abnormalities Noted No Abnormalities Noted Periwound Skin Moisture: Erythema: Yes Erythema: No Rubor: Yes Periwound Skin Color: Measured: 1cm N/Yu N/Yu Erythema Measurement: No Change N/Yu N/Yu Erythema Change: No Abnormality No Abnormality No Abnormality Temperature: N/Yu Yes Yes Tenderness on Palpation: Cellular or Tissue Based Product N/Yu Cellular or Tissue Based Product Procedures Performed: Debridement Debridement Treatment Notes Electronic Signature(s) Signed: 07/12/2022 2:08:24 PM By: Duanne Guess MD FACS Signed: 07/12/2022 5:12:02 PM By: Andrea Deed RN, BSN Entered By: Duanne Guess on 07/12/2022 14:08:23 -------------------------------------------------------------------------------- Multi-Disciplinary Care Plan  Details Patient Name: Date of Service: Andrea Yu. 07/12/2022 1:15 PM Medical Record Number: [id number] Patient Account Number: [id number] Date of Birth/Sex: Treating RN: Dec 29, 1940 (81 y.o. [address] [place], [place] Yu ([id number]) [id number] Page 4 of 10 Primary Care Abdiel Blackerby: Andrea Yu Other Clinician: Referring Keigen Caddell: Treating Lisaann Atha/Extender: Andrea Yu in Treatment: 32 Multidisciplinary Care Plan reviewed with physician Active Inactive Pressure Nursing Diagnoses: Knowledge deficit related to causes and risk factors for pressure ulcer development Knowledge deficit related to management of pressures ulcers Potential for impaired tissue integrity related to pressure, friction, moisture, and shear Goals: Patient/caregiver will verbalize understanding of pressure ulcer management Date Initiated: 11/24/2021 Target Resolution Date: 08/09/2022 Goal Status: Active Interventions: Assess: immobility, friction, shearing, incontinence upon admission and as needed Assess offloading mechanisms upon admission and as needed Assess potential for pressure ulcer upon admission and as needed Provide education on pressure ulcers Treatment Activities: Pressure reduction/relief device ordered : 11/24/2021 Notes: Wound/Skin Impairment Nursing Diagnoses: Impaired tissue integrity Knowledge deficit related to ulceration/compromised skin integrity Goals: Patient/caregiver will verbalize understanding of skin care regimen Date Initiated: 11/24/2021 Target Resolution Date: 08/09/2022 Goal Status: Active Ulcer/skin breakdown will have Yu volume reduction  of 30% by week 4 Date Initiated: 11/24/2021 Date Inactivated: 05/31/2022 Target Resolution Date: 05/25/2022 Unmet Reason: refuses VAC, Goal Status: Unmet noncompliant with offloading Interventions: Assess patient/caregiver ability to obtain necessary supplies Assess  patient/caregiver ability to perform ulcer/skin care regimen upon admission and as needed Assess ulceration(s) every visit Provide education on ulcer and skin care Treatment Activities: Skin care regimen initiated : 11/24/2021 Topical wound management initiated : 11/24/2021 Notes: Electronic Signature(s) Signed: 07/12/2022 5:12:02 PM By: Andrea Deed RN, BSN Entered By: Andrea Yu on 07/12/2022 13:52:45 -------------------------------------------------------------------------------- Pain Assessment Details Patient Name: Date of Service: Andrea Yu 07/12/2022 1:15 PM Medical Record Number: [id number] Patient Account Number: [id number] Andrea Yu, Andrea Yu ([id number]) [id number] Page 5 of 10 Date of Birth/Sex: Treating RN: 06-12-1940 (82 y.o. F) Primary Care Merlene Dante: Other Clinician: Jarome Yu Referring Raia Amico: Treating Kaegan Hettich/Extender: Andrea Yu in Treatment: 32 Active Problems Location of Pain Severity and Description of Pain Patient Has Paino No Site Locations Rate the pain. Current Pain Level: 0 Pain Management and Medication Current Pain Management: Electronic Signature(s) Signed: 07/12/2022 5:12:02 PM By: Andrea Deed RN, BSN Entered By: Andrea Yu on 07/12/2022 13:29:28 -------------------------------------------------------------------------------- Patient/Caregiver Education Details Patient Name: Date of Service: Andrea Yu 6/17/2024andnbsp1:15 PM Medical Record Number: [id number] Patient Account Number: [id number] Date of Birth/Gender: Treating RN: [birth year] (81 y.o. [name] Primary Care Physician: Andrea Yu Other Clinician: Referring Physician: Treating Physician/Extender: Andrea Yu in Treatment: 32 Education Assessment Education Provided To: Patient Education Topics Provided Pressure: Methods: Explain/Verbal Responses:  Reinforcements needed, State content correctly Wound/Skin Impairment: Methods: Explain/Verbal Responses: Reinforcements needed, State content correctly Electronic Signature(s) Signed: 07/12/2022 5:12:02 PM By: Andrea Deed RN, BSN Entered By: Andrea Yu on 07/12/2022 13:55:06 Andrea Yu ([id number]) [id number] Page 6 of 10 -------------------------------------------------------------------------------- Wound Assessment Details Patient Name: Date of Service: Andrea Yu 07/12/2022 1:15 PM Medical Record Number: [id number] Patient Account Number: [id number] Date of Birth/Sex: Treating RN: 27-Apr-1940 (82 y.o. [place], Linda Primary Care Paetyn Pietrzak: Andrea Yu Other Clinician: Referring Analia Zuk: Treating Deveon Kisiel/Extender: Andrea Yu in Treatment: 32 Wound Status Wound Number: 1 Primary Pressure Ulcer Etiology: Wound Location: Right, Medial Malleolus Wound Open Wounding Event: Pressure Injury Status: Date Acquired: 11/16/2021 Comorbid Cataracts, Arrhythmia, Hypertension, Osteoarthritis, Weeks Of Treatment: 32 History: Osteomyelitis, Neuropathy, Confinement Anxiety Clustered Wound: No Photos Wound Measurements Length: (cm) 1.3 Width: (cm) 1.5 Depth: (cm) 0.1 Area: (cm) 1.532 Volume: (cm) 0.153 % Reduction in Area: 0.5% % Reduction in Volume: 0.6% Epithelialization: None Tunneling: No Undermining: No Wound Description Classification: Category/Stage III Wound Margin: Distinct, outline attached Exudate Amount: Medium Exudate Type: Serosanguineous Exudate Color: red, brown Foul Odor After Cleansing: No Slough/Fibrino Yes Wound Bed Granulation Amount: Small (1-33%) Exposed Structure Granulation Quality: Red, Pink Fascia Exposed: No Necrotic Amount: Large (67-100%) Fat Layer (Subcutaneous Tissue) Exposed: Yes Necrotic Quality: Adherent Slough Tendon Exposed: No Muscle Exposed: No Joint  Exposed: No Bone Exposed: No Periwound Skin Texture Texture Color No Abnormalities Noted: Yes No Abnormalities Noted: No Erythema: Yes Moisture Erythema Measurement: Measured No Abnormalities Noted: Yes 1 cm Erythema Change: No Change Temperature / Pain Temperature: No Abnormality Treatment Notes Andrea Yu, Andrea Yu ([id number]) [id number] Page 7 of 10 Wound #1 (Malleolus) Wound Laterality: Right, Medial Cleanser Normal Saline Discharge Instruction: Cleanse the wound with Normal Saline prior to applying Yu clean dressing using gauze sponges, not tissue or cotton balls. [facility] Ancillary Kit - 15 Day Supply Discharge Instruction: Use supplies as  instructed; Kit contains: (15) Saline Bullets; (15) 3x3 Gauze; 15 pr Gloves Peri-Wound Care Skin Prep Discharge Instruction: Use skin prep as directed Topical Primary Dressing Oasis tri-layer Secondary Dressing Zetuvit Plus Silicone Border Dressing 4x4 (in/in) Discharge Instruction: Apply silicone border over primary dressing as directed. Secured With [employer], 4.5x3.1 (in/yd) Discharge Instruction: Secure with Kerlix as directed. Compression Wrap Compression Stockings Add-Ons Electronic Signature(s) Signed: 07/12/2022 5:12:02 PM By: Andrea Deed RN, BSN Entered By: Andrea Yu on 07/12/2022 13:33:51 -------------------------------------------------------------------------------- Wound Assessment Details Patient Name: Date of Service: Andrea Yu 07/12/2022 1:15 PM Medical Record Number: [id number] Patient Account Number: [id number] Date of Birth/Sex: Treating RN: [birth year] (81 y.o. F) Primary Care Wille Aubuchon: Andrea Yu Other Clinician: Referring Laken Lobato: Treating Leonora Gores/Extender: Andrea Yu in Treatment: 32 Wound Status Wound Number: 2 Primary Abscess Etiology: Wound Location: Right Gluteus Wound Open Wounding Event: Bump Status: Date  Acquired: 09/28/2021 Comorbid Cataracts, Arrhythmia, Hypertension, Osteoarthritis, Weeks Of Treatment: 32 History: Osteomyelitis, Neuropathy, Confinement Anxiety Clustered Wound: No Photos Wound Measurements Andrea Yu, Andrea Yu ([id number]) Length: (cm) 3 Width: (cm) 1 Depth: (cm) 3.6 Area: (cm) 2.356 Volume: (cm) 8.482 [id number] Page 8 of 10 % Reduction in Area: 77.4% % Reduction in Volume: 72.9% Epithelialization: None Tunneling: Yes Position (o'clock): 2 Maximum Distance: (cm) 4.7 Wound Description Classification: Full Thickness With Exposed Support Structures Wound Margin: Well defined, not attached Exudate Amount: Medium Exudate Type: Serous Exudate Color: amber Foul Odor After Cleansing: No Slough/Fibrino Yes Wound Bed Granulation Amount: Large (67-100%) Exposed Structure Granulation Quality: Pink, Pale Fascia Exposed: No Necrotic Amount: Small (1-33%) Fat Layer (Subcutaneous Tissue) Exposed: Yes Necrotic Quality: Adherent Slough Tendon Exposed: No Muscle Exposed: No Joint Exposed: No Bone Exposed: No Periwound Skin Texture Texture Color No Abnormalities Noted: Yes No Abnormalities Noted: Yes Moisture Temperature / Pain No Abnormalities Noted: Yes Temperature: No Abnormality Tenderness on Palpation: Yes Treatment Notes Wound #2 (Gluteus) Wound Laterality: Right Cleanser Normal Saline Discharge Instruction: Cleanse the wound with Normal Saline prior to applying Yu clean dressing using gauze sponges, not tissue or cotton balls. Peri-Wound Care Skin Prep Discharge Instruction: Use skin prep as directed Topical Primary Dressing Dakin's Solution 0.25%, 16 (oz) Discharge Instruction: Moisten gauze with Dakin's solution cotton tipped applicators Discharge Instruction: use to pack wound Secondary Dressing Zetuvit Plus Silicone Border Dressing 5x5 (in/in) Discharge Instruction: Apply silicone border over primary dressing as  directed. Secured With 33M Medipore H Soft Cloth Surgical T ape, 4 x 10 (in/yd) Discharge Instruction: Secure with tape as directed. Compression Wrap Compression Stockings Add-Ons Electronic Signature(s) Signed: 07/12/2022 5:12:02 PM By: Andrea Deed RN, BSN Previous Signature: 07/12/2022 1:39:25 PM Version By: [place] Scrape Entered By: Andrea Yu on 07/12/2022 13:42:56 Andrea Yu ([id number]) [id number] Page 9 of 10 -------------------------------------------------------------------------------- Wound Assessment Details Patient Name: Date of Service: Andrea Yu 07/12/2022 1:15 PM Medical Record Number: [id number] Patient Account Number: [id number] Date of Birth/Sex: Treating RN: 03/25/1940 (82 y.o. F) Primary Care Vuk Skillern: Andrea Yu Other Clinician: Referring Vinod Mikesell: Treating Ajeet Casasola/Extender: Andrea Yu in Treatment: 32 Wound Status Wound Number: 4 Primary Pressure Ulcer Etiology: Wound Location: Left, Lateral Ankle Wound Open Wounding Event: Pressure Injury Status: Date Acquired: 12/23/2021 Comorbid Cataracts, Arrhythmia, Hypertension, Osteoarthritis, Weeks Of Treatment: 27 History: Osteomyelitis, Neuropathy, Confinement Anxiety Clustered Wound: No Photos Wound Measurements Length: (cm) 1.2 Width: (cm) 0.9 Depth: (cm) 0.4 Area: (cm) 0.848 Volume: (cm) 0.339 % Reduction in Area: -501.4% % Reduction in Volume: -2321.4% Epithelialization: None Tunneling: No  Undermining: No Wound Description Classification: Category/Stage IV Wound Margin: Distinct, outline attached Exudate Amount: Medium Exudate Type: Serosanguineous Exudate Color: red, brown Foul Odor After Cleansing: No Slough/Fibrino Yes Wound Bed Granulation Amount: None Present (0%) Exposed Structure Necrotic Amount: Large (67-100%) Fascia Exposed: No Necrotic Quality: Adherent Slough Fat Layer (Subcutaneous Tissue) Exposed:  Yes Tendon Exposed: Yes Muscle Exposed: No Joint Exposed: No Bone Exposed: Yes Periwound Skin Texture Texture Color No Abnormalities Noted: Yes No Abnormalities Noted: Yes Moisture Temperature / Pain No Abnormalities Noted: Yes Temperature: No Abnormality Tenderness on Palpation: Yes Treatment Notes Wound #4 (Ankle) Wound Laterality: Left, Lateral Cleanser Normal Saline Discharge Instruction: Cleanse the wound with Normal Saline prior to applying Yu clean dressing using gauze sponges, not tissue or cotton balls. [facility] Ancillary Kit - [address] Andrea Yu, Andrea Yu ([id number]) [id number] Page 10 of 10 Discharge Instruction: Use supplies as instructed; Kit contains: (15) Saline Bullets; (15) 3x3 Gauze; 15 pr Gloves Peri-Wound Care Skin Prep Discharge Instruction: Use skin prep as directed Topical Primary Dressing Oasis tri-layer Secondary Dressing Zetuvit Plus Silicone Border Dressing 4x4 (in/in) Discharge Instruction: Apply silicone border over primary dressing as directed. Secured With [employer], 4.5x3.1 (in/yd) Discharge Instruction: Secure with Kerlix as directed. Compression Wrap Compression Stockings Add-Ons Electronic Signature(s) Signed: 07/12/2022 5:12:02 PM By: Andrea Deed RN, BSN Entered By: Andrea Yu on 07/12/2022 13:35:29 -------------------------------------------------------------------------------- Vitals Details Patient Name: Date of Service: Andrea Yu 07/12/2022 1:15 PM Medical Record Number: [id number] Patient Account Number: [id number] Date of Birth/Sex: Treating RN: 11-26-1940 (81 y.o. F) Primary Care Venie Montesinos: Andrea Yu Other Clinician: Referring Lavita Pontius: Treating Deretha Ertle/Extender: Andrea Yu in Treatment: 32 Vital Signs Time Taken: 01:13 Temperature (F): 98.2 Height (in): 59 Pulse (bpm): 73 Weight (lbs): 155 Respiratory Rate (breaths/min): 20 Body Mass  Index (BMI): 31.3 Blood Pressure (mmHg): 120/66 Reference Range: 80 - 120 mg / dl Electronic Signature(s) Signed: 07/12/2022 5:12:02 PM By: Andrea Deed RN, BSN Entered By: Andrea Yu on 07/12/2022 13:29:13

## 2022-07-13 NOTE — Progress Notes (Signed)
CARLINDA, Andrea [name] ([id number]) [id number] Page 1 of 14 Visit Report for 07/12/2022 Chief Complaint Document Details Patient Name: Date of Service: Andrea [name] 07/12/2022 1:15 PM Medical Record Number: [id number] Patient Account Number: [id number] Date of Birth/Sex: Treating RN: Apr 24, 1940 (82 y.o. Andrea [name] Primary Care Provider: Jarome Matin Other Clinician: Referring Provider: Treating Provider/Extender: Marena Chancy in Treatment: 32 Information Obtained from: Patient Chief Complaint Patient is at the clinic for treatment of an open pressure ulcer on her right medial ankle, and [name] large abscess on her right buttock Electronic Signature(s) Signed: 07/12/2022 2:08:34 PM By: Duanne Guess MD FACS Entered By: Duanne Guess on 07/12/2022 14:08:33 -------------------------------------------------------------------------------- Cellular or Tissue Based Product Details Patient Name: Date of Service: Andrea [name] 07/12/2022 1:15 PM Medical Record Number: [id number] Patient Account Number: [id number] Date of Birth/Sex: Treating RN: 1940/10/21 (82 y.o. Andrea [name] Primary Care Provider: Jarome Matin Other Clinician: Referring Provider: Treating Provider/Extender: Marena Chancy in Treatment: 32 Cellular or Tissue Based Product Type Wound #4 Left,Lateral Ankle Applied to: Performed By: Physician Duanne Guess, MD Cellular or Tissue Based Product Type: Oasis tri-layer Level of Consciousness (Pre-procedure): Awake and Alert Pre-procedure Verification/Time Out Yes - 13:35 Taken: Location: trunk / arms / legs Wound Size (sq cm): 1.08 Product Size (sq cm): 5 Waste Size (sq cm): 0 Amount of Product Applied (sq cm): 5 Instrument Used: Forceps, Scissors Lot #: [id number] Order #: 1 Expiration Date: 05/04/2023 Fenestrated: No Reconstituted: Yes Solution Type: saline Solution  Amount: 3 ml Lot #: [id number] KS Solution Expiration Date: 02/16/2024 Secured: Yes Secured With: Steri-Strips Dressing Applied: Yes Primary Dressing: adaptic, drawtex Procedural Pain: 0 Post Procedural Pain: 0 Response to Treatment: Procedure was tolerated well Level of Consciousness (Post- Awake and Alert Andrea [name], Andrea [name] ([id number]) [id number] Page 2 of 14 procedure): Post Procedure Diagnosis Same as Pre-procedure Electronic Signature(s) Signed: 07/12/2022 2:53:14 PM By: Duanne Guess MD FACS Signed: 07/12/2022 5:12:02 PM By: Zenaida Deed RN, BSN Entered By: Zenaida Deed on 07/12/2022 13:59:39 -------------------------------------------------------------------------------- Cellular or Tissue Based Product Details Patient Name: Date of Service: Andrea [name] 07/12/2022 1:15 PM Medical Record Number: [id number] Patient Account Number: [id number] Date of Birth/Sex: Treating RN: [birth year] (82 y.o. Andrea [name] Primary Care Provider: Jarome Matin Other Clinician: Referring Provider: Treating Provider/Extender: Marena Chancy in Treatment: 32 Cellular or Tissue Based Product Type Wound #1 Right,Medial Malleolus Applied to: Performed By: Physician Duanne Guess, MD Cellular or Tissue Based Product Type: Oasis tri-layer Level of Consciousness (Pre-procedure): Awake and Alert Pre-procedure Verification/Time Out Yes - 13:35 Taken: Location: trunk / arms / legs Wound Size (sq cm): 1.95 Product Size (sq cm): 5 Waste Size (sq cm): 0 Amount of Product Applied (sq cm): 5 Instrument Used: Forceps, Scissors Lot #: [id number] Order #: 1 Expiration Date: 05/04/2023 Fenestrated: No Reconstituted: Yes Solution Type: saline Solution Amount: 3 ml Lot #: [id number] KS Solution Expiration Date: 02/16/2024 Secured: Yes Secured With: Steri-Strips Dressing Applied: Yes Primary Dressing: adaptic, gauze Procedural Pain:  0 Post Procedural Pain: 0 Response to Treatment: Procedure was tolerated well Level of Consciousness (Post- Awake and Alert procedure): Post Procedure Diagnosis Same as Pre-procedure Electronic Signature(s) Signed: 07/12/2022 2:53:14 PM By: Duanne Guess MD FACS Signed: 07/12/2022 5:12:02 PM By: Zenaida Deed RN, BSN Entered By: Zenaida Deed on 07/12/2022 14:00:56 Andrea [name] ([id number]) [id number] Page 3 of 14 -------------------------------------------------------------------------------- Debridement Details Patient Name: Date of Service: Andrea [name]. 07/12/2022 1:15  PM Medical Record Number: [id number] Patient Account Number: [id number] Date of Birth/Sex: Treating RN: 09-28-1940 (82 y.o. Andrea [name] Primary Care Provider: Jarome Matin Other Clinician: Referring Provider: Treating Provider/Extender: Marena Chancy in Treatment: 32 Debridement Performed for Assessment: Wound #4 Left,Lateral Ankle Performed By: Physician Duanne Guess, MD Debridement Type: Debridement Level of Consciousness (Pre-procedure): Awake and Alert Pre-procedure Verification/Time Out Yes - 13:30 Taken: Start Time: 13:30 Pain Control: Lidocaine 4% T opical Solution Percent of Wound Bed Debrided: 100% T Area Debrided (cm): otal 0.85 Tissue and other material debrided: Viable, Non-Viable, Slough, Subcutaneous, Slough Level: Skin/Subcutaneous Tissue Debridement Description: Excisional Instrument: Curette Bleeding: Minimum Hemostasis Achieved: Pressure Procedural Pain: 0 Post Procedural Pain: 0 Response to Treatment: Procedure was tolerated well Level of Consciousness (Post- Awake and Alert procedure): Post Debridement Measurements of Total Wound Length: (cm) 1.2 Stage: Category/Stage IV Width: (cm) 0.9 Depth: (cm) 0.4 Volume: (cm) 0.339 Character of Wound/Ulcer Post Debridement: Improved Post Procedure  Diagnosis Same as Pre-procedure Notes scribed for Dr. Lady Gary by Zenaida Deed, RN Electronic Signature(s) Signed: 07/12/2022 2:53:14 PM By: Duanne Guess MD FACS Signed: 07/12/2022 5:12:02 PM By: Zenaida Deed RN, BSN Entered By: Zenaida Deed on 07/12/2022 13:56:59 -------------------------------------------------------------------------------- Debridement Details Patient Name: Date of Service: Andrea [name]. 07/12/2022 1:15 PM Medical Record Number: [id number] Patient Account Number: [id number] Date of Birth/Sex: Treating RN: 1940-07-24 (81 y.o. Andrea [name] Primary Care Provider: Jarome Matin Other Clinician: Referring Provider: Treating Provider/Extender: Marena Chancy in Treatment: 32 Debridement Performed for Assessment: Wound #1 Right,Medial Malleolus Performed By: Physician Duanne Guess, MD Debridement Type: Debridement Level of Consciousness (Pre-procedure): Awake and Alert Pre-procedure Verification/Time Out Yes - 13:30 Taken: Start Time: 13:30 Pain Control: Lidocaine 4% T opical Solution Percent of Wound Bed Debrided: 100% T Area Debrided (cm): otal 1.53 Tissue and other material debrided: Non-Viable, Biofilm Andrea [name], Andrea [name] ([id number]) [id number] Page 4 of 14 Level: Non-Viable Tissue Debridement Description: Selective/Open Wound Instrument: Curette Bleeding: Minimum Hemostasis Achieved: Pressure Procedural Pain: 0 Post Procedural Pain: 0 Response to Treatment: Procedure was tolerated well Level of Consciousness (Post- Awake and Alert procedure): Post Debridement Measurements of Total Wound Length: (cm) 1.3 Stage: Category/Stage III Width: (cm) 1.5 Depth: (cm) 0.1 Volume: (cm) 0.153 Character of Wound/Ulcer Post Debridement: Improved Post Procedure Diagnosis Same as Pre-procedure Notes scribed for Dr. Lady Gary by Zenaida Deed, RN Electronic Signature(s) Signed: 07/12/2022  2:53:14 PM By: Duanne Guess MD FACS Signed: 07/12/2022 5:12:02 PM By: Zenaida Deed RN, BSN Entered By: Zenaida Deed on 07/12/2022 14:00:15 -------------------------------------------------------------------------------- HPI Details Patient Name: Date of Service: Andrea [name]. 07/12/2022 1:15 PM Medical Record Number: [id number] Patient Account Number: [id number] Date of Birth/Sex: Treating RN: 1940-11-19 (81 y.o. 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 She is not diabetic and quit smoking over 40 years ago. 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 She has developed [name] pressure ulcer on the right medial malleolus. In addition, she apparently has had multiple cutaneous abscesses on her buttocks that have required repeated incision and drainage procedures. She has developed [name] large abscess on her right buttock that has become painful. She was recently treated with cephalexin by her PCP for  urinary tract infection and also received [name] shot of Rocephin for the abscess, but it has not yet been incised or drained. She is nonambulatory but is able to stand to transfer. She does not wear any sort of Prevalon boot. ABI in clinic today was 0.96. On her right medial malleolus, there is [name] circular ulcer. The surface is dry and fibrotic. There is some periwound erythema, but no malodor or purulent drainage. On her right buttock, there is [name] large fluctuant bulge about the size of an orange. It is also erythematous and tender. 12/04/2021: The culture from her abscess grew out staph. [name] 10-day course of Bactrim was  prescribed and she is currently taking this. Unfortunately, Dakin's solution was not delivered and only 2 x 2 gauze was sent, rather than Kerlix so the packing of the wound has been rather suboptimal. The wound cavity has light slough over all of the surfaces. Although covered, bone is palpable. The medial ankle wound looks about the same with [name] layer of slough accumulation. In addition, her daughter peeled some dry skin off of her foot this morning and she now has denuded areas over the majority of the plantar surface of her right foot. 01/01/2022: The patient has missed several visits and to her daughters report that it is somewhat difficult to get her here on [name] weekly basis. In the interim, she has developed [name] new pressure ulcer on her left lateral malleolus. The plantar surface of her right foot has healed. The cavity on her right buttock has contracted, but it remains quite deep and the trochanter, while not exposed, is palpable at the base. The pressure ulcer on her right medial malleolus is basically unchanged. 02/09/2022: All wounds are little bit smaller today. As the buttock abscess cavity has contracted, it has left some tunneling that has not been adequately packed. Light slough accumulation on both ankle wounds. 02/26/2022: All of the wounds continue to contract. There is slough on both ankle wounds. The buttock ulcer is clean. She brought her wound VAC with her today. 03/12/2022: The ankle wounds are about the same this week with slough accumulation. The buttock ulcer is smaller and quite clean. She decided that she could not tolerate the discomfort of the wound VAC and removed it. They have been packing the wound with Dakin's moistened gauze. Andrea [name], Andrea [name] ([id number]) [id number] Page 5 of 14 03/25/2022: The ankle wounds are slightly smaller. Both have slough accumulation. The orifice of the buttock ulcer has gotten quite [name] bit smaller than the underlying cavity.  She has decided that she would like to have the wound VAC back. 3/14; patient presents for follow-up. She has bilateral ankle wounds and has been using Hydrofera Blue to the these wound beds. She has Prevalon boots to help with offloading. She also has [name] sacral wound that we have been using [name] wound VAC for. She has no issues or complaints today. 04/29/2022: She once again decided she did not like the wound VAC and so they have been packing her gluteal wound with Dakin's moistened gauze. The gluteal ulcer seems to be about the same. Her left lateral ankle wound has gotten deeper and larger. The surface tissue is gray with thick slough. No significant change to the right medial ankle wound. 05/18/2022: The depth on the gluteal ulcer has come in [name] bit. There was [name] very strong pungent odor when the packing was removed, however. The right medial ankle wound is about the same size, but the tissue surface  looks healthier. The left lateral ankle wound has reaccumulated the rubbery gray slough. 05/31/2022: The gluteal ulcer is shallower and no longer has any dips or crevices. The odor has abated completely. The right medial ankle wound is unchanged. The left lateral ankle wound looks quite [name] bit healthier with [name] cleaner surface and the beginnings of granulation tissue emergence. 06/16/2022: The gluteal ulcer has the same measurements more or less, but overall the volume seems smaller, as the cavity feels tighter on examination. The ankle wounds are basically the same. 07/12/2022: The cavity of the buttock ulcer continues to contract. The left ankle wound has [name] little bit more granulation tissue, but bone remains exposed. The right ankle wound is flush with the surrounding skin. Electronic Signature(s) Signed: 07/12/2022 2:09:27 PM By: Duanne Guess MD FACS Entered By: Duanne Guess on 07/12/2022 14:09:26 -------------------------------------------------------------------------------- Physical Exam  Details Patient Name: Date of Service: Andrea [name] 07/12/2022 1:15 PM Medical Record Number: [id number] Patient Account Number: [id number] Date of Birth/Sex: Treating RN: 1940-07-03 (81 y.o. Andrea [name] Primary Care Provider: Jarome Matin Other Clinician: Referring Provider: Treating Provider/Extender: Marena Chancy in Treatment: 32 Constitutional . . . . no acute distress. Respiratory Normal work of breathing on room air. Notes 07/12/2022: The cavity of the buttock ulcer continues to contract. The left ankle wound has [name] little bit more granulation tissue, but bone remains exposed. The right ankle wound is flush with the surrounding skin. Electronic Signature(s) Signed: 07/12/2022 2:12:26 PM By: Duanne Guess MD FACS Entered By: Duanne Guess on 07/12/2022 14:12:26 -------------------------------------------------------------------------------- Physician Orders Details Patient Name: Date of Service: Andrea [name] 07/12/2022 1:15 PM Medical Record Number: [id number] Patient Account Number: [id number] Date of Birth/Sex: Treating RN: 1940-03-14 (81 y.o. 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 - Dr. Lady Gary - Room 2 Return [name] Wed 6/26 @  10:00 am Anesthetic (In clinic) Topical Lidocaine 4% applied to wound bed Cellular or Tissue Based Products Cellular or Tissue Based Product Type: - [employer] fast streamer or Tissue Based Product applied to wound bed, secured with steri-strips, cover with Adaptic or Mepitel. (DO NOT REMOVE). Bathing/ Shower/ Hygiene May shower and wash wound with soap and water. Off-Loading Multipodus Splint to: - prevalon boot to both feet Turn and reposition every 2 hours Home Health New wound care orders this week; continue Home Health for wound care. May utilize formulary equivalent dressing for wound treatment orders unless otherwise specified. - may chage outer dressing only on ankles, DO NOT remove sterostrips Dressing changes to be completed by Home Health on Monday / Wednesday / Friday except when patient has scheduled visit at [facility]. Other Home Health Orders/Instructions: - [facility] Wound Treatment Wound #1 - Malleolus Wound Laterality: Right, Medial Cleanser: Normal Saline (Generic) 1 x Per Week/30 Days Discharge Instructions: Cleanse the wound with Normal Saline prior to applying [name] clean dressing using gauze sponges, not tissue or cotton balls. Cleanser: Byram Ancillary Kit -  15 Day Supply (Generic) 1 x Per Week/30 Days Discharge Instructions: Use supplies as instructed; Kit contains: (15) Saline Bullets; (15) 3x3 Gauze; 15 pr Gloves Peri-Wound Care: Skin Prep (Generic) 1 x Per Week/30 Days Discharge Instructions: Use skin prep as directed Prim Dressing: Oasis tri-layer ary 1 x Per Week/30 Days Secondary Dressing: Zetuvit Plus Silicone Border Dressing 4x4 (in/in) (Generic) 1 x Per Week/30 Days Discharge Instructions: Apply silicone border over primary dressing as directed. Secured With: [employer], 4.5x3.1 (in/yd) 1 x Per Week/30 Days Discharge Instructions: Secure with Kerlix as directed. Wound #2 - Gluteus Wound Laterality: Right Cleanser: Normal Saline 1 x Per  Day/30 Days Discharge Instructions: Cleanse the wound with Normal Saline prior to applying [name] clean dressing using gauze sponges, not tissue or cotton balls. Peri-Wound Care: Skin Prep (Generic) 1 x Per Day/30 Days Discharge Instructions: Use skin prep as directed Prim Dressing: Dakin's Solution 0.25%, 16 (oz) (Generic) 1 x Per Day/30 Days ary Discharge Instructions: Moisten gauze with Dakin's solution Prim Dressing: cotton tipped applicators (Generic) 1 x Per Day/30 Days ary Discharge Instructions: use to pack wound Secondary Dressing: Zetuvit Plus Silicone Border Dressing 5x5 (in/in) (Generic) 1 x Per Day/30 Days Discharge Instructions: Apply silicone border over primary dressing as directed. Secured With: 43M Medipore H Soft Cloth Surgical T ape, 4 x 10 (in/yd) (Generic) 1 x Per Day/30 Days Discharge Instructions: Secure with Andrea as directed. Wound #4 - Ankle Wound Laterality: Left, Lateral Cleanser: Normal Saline (Generic) 1 x Per Week/30 Days NECHY, DOMINQUEZ [name] ([id number]) [id number] Page 7 of 14 Discharge Instructions: Cleanse the wound with Normal Saline prior to applying [name] clean dressing using gauze sponges, not tissue or cotton balls. Cleanser: Byram Ancillary Kit - 15 Day Supply (Generic) 1 x Per Week/30 Days Discharge Instructions: Use supplies as instructed; Kit contains: (15) Saline Bullets; (15) 3x3 Gauze; 15 pr Gloves Peri-Wound Care: Skin Prep (Generic) 1 x Per Week/30 Days Discharge Instructions: Use skin prep as directed Prim Dressing: Oasis tri-layer ary 1 x Per Week/30 Days Secondary Dressing: Zetuvit Plus Silicone Border Dressing 4x4 (in/in) (Generic) 1 x Per Week/30 Days Discharge Instructions: Apply silicone border over primary dressing as directed. Secured With: [employer], 4.5x3.1 (in/yd) 1 x Per Week/30 Days Discharge Instructions: Secure with Kerlix as directed. Electronic Signature(s) Signed: 07/12/2022 2:53:14 PM By: Duanne Guess MD FACS Entered By: Duanne Guess on 07/12/2022 14:12:40 -------------------------------------------------------------------------------- Problem List Details Patient Name: Date of Service: Andrea [name] 07/12/2022 1:15 PM Medical Record Number: [id number] Patient Account Number: [id number] Date of Birth/Sex: Treating RN: 09-23-1940 (81 y.o. 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 01/01/2022 No Yes evidence of necrosis L02.31 Cutaneous abscess of buttock 11/24/2021 No Yes E27.40 Unspecified adrenocortical insufficiency 11/24/2021 No Yes Z79.52 Long term (current) use of systemic steroids 11/24/2021 No Yes I10 Essential (primary) hypertension 11/24/2021 No Yes Inactive Problems Andrea [name], Andrea [name] ([id number]) [id number] Page 8 of 14 Resolved Problems ICD-10 Code Description Active Date Resolved Date L97.511 Non-pressure chronic ulcer of other part of right foot limited to breakdown of skin 12/04/2021 12/04/2021 Electronic Signature(s) Signed: 07/12/2022 2:08:17 PM By: Duanne Guess MD FACS Entered By: Duanne Guess  on 07/12/2022 14:08:17 -------------------------------------------------------------------------------- Progress Note Details Patient Name: Date of Service: Andrea [name] 07/12/2022 1:15 PM Medical Record Number: [id number] Patient Account Number: [id number] Date of Birth/Sex: Treating RN: 02-28-1940 (82 y.o. 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 She is not diabetic and quit smoking over 40 years ago. 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 She has developed [name] pressure ulcer on the right medial malleolus. In addition, she apparently has had multiple cutaneous abscesses on her buttocks that have required repeated incision and drainage procedures. She has developed [name] large abscess on her right buttock that has become painful. She was recently treated with cephalexin by her PCP for urinary tract infection and also received [name] shot of Rocephin for the abscess, but it has not yet been incised or drained. She is nonambulatory but is able to stand to transfer. She does not wear any sort of Prevalon boot. ABI in clinic today was 0.96. On her right medial malleolus, there is [name] circular ulcer. The surface is dry and fibrotic. There is some periwound erythema, but no malodor or purulent drainage. On her right buttock, there is [name] large fluctuant bulge about the size of an orange. It is also erythematous and tender. 12/04/2021: The culture from her abscess grew out staph. [name] 10-day course of Bactrim was prescribed and she is currently taking this. Unfortunately, Dakin's solution was not delivered and only 2 x 2 gauze was sent, rather than Kerlix so the packing of the wound has been rather suboptimal. The wound cavity has light slough over all of the surfaces. Although covered, bone is palpable. The medial ankle wound looks about the same  with [name] layer of slough accumulation. In addition, her daughter peeled some dry skin off of her foot this morning and she now has denuded areas over the majority of the plantar surface of her right foot. 01/01/2022: The patient has missed several visits and to her daughters report that it is somewhat difficult to get her here on [name] weekly basis. In the interim, she has developed [name] new pressure ulcer on her left lateral malleolus. The plantar surface of her right foot has healed. The cavity on her right buttock has contracted, but it remains quite deep and the trochanter, while not exposed, is palpable at the base. The pressure ulcer on her right medial malleolus is basically unchanged. 02/09/2022: All wounds are little bit smaller today. As the buttock abscess cavity has contracted, it has left some tunneling that has not been adequately packed. Light slough accumulation on both ankle wounds. 02/26/2022: All of the wounds continue to contract. There is slough on both ankle wounds. The buttock ulcer is clean. She brought her wound VAC with her today. 03/12/2022: The ankle wounds are about the same this week with slough accumulation. The buttock ulcer is smaller and quite clean. She decided that she could not tolerate the discomfort of the wound VAC and removed it. They have been packing the wound with Dakin's moistened gauze. 03/25/2022:  The ankle wounds are slightly smaller. Both have slough accumulation. The orifice of the buttock ulcer has gotten quite [name] bit smaller than the underlying cavity. She has decided that she would like to have the wound VAC back. 3/14; patient presents for follow-up. She has bilateral ankle wounds and has been using Hydrofera Blue to the these wound beds. She has Prevalon boots to help with offloading. She also has [name] sacral wound that we have been using [name] wound VAC for. She has no issues or complaints today. 04/29/2022: She once again decided she did not like the wound VAC and so  they have been packing her gluteal wound with Dakin's moistened gauze. The gluteal ulcer seems to be about the same. Her left lateral ankle wound has gotten deeper and larger. The surface tissue is gray with thick slough. No significant change to the right medial ankle wound. 05/18/2022: The depth on the gluteal ulcer has come in [name] bit. There was [name] very strong pungent odor when the packing was removed, however. The right medial ankle wound is about the same size, but the tissue surface looks healthier. The left lateral ankle wound has reaccumulated the rubbery gray slough. 05/31/2022: The gluteal ulcer is shallower and no longer has any dips or crevices. The odor has abated completely. The right medial ankle wound is unchanged. The left lateral ankle wound looks quite [name] bit healthier with [name] cleaner surface and the beginnings of granulation tissue emergence. Andrea [name], Andrea [name] ([id number]) [id number] Page 9 of 14 06/16/2022: The gluteal ulcer has the same measurements more or less, but overall the volume seems smaller, as the cavity feels tighter on examination. The ankle wounds are basically the same. 07/12/2022: The cavity of the buttock ulcer continues to contract. The left ankle wound has [name] little bit more granulation tissue, but bone remains exposed. The right ankle wound is flush with the surrounding skin. Patient History Information obtained from Patient, Caregiver, Chart. Family History Cancer - Father,Maternal Grandparents,Siblings,Child, Heart Disease - Father, Hypertension - Father, Thyroid Problems - Mother, No family history of Diabetes, Hereditary Spherocytosis, Kidney Disease, Lung Disease, Seizures, Stroke, Tuberculosis. Social History Former smoker - quit 40 yr ago, Marital Status - Widowed, Alcohol Use - Never, Drug Use - No History, Caffeine Use - Daily - tea, soda. Medical History Eyes Patient has history of Cataracts - bil extracted Denies history of  Glaucoma, Optic Neuritis Cardiovascular Patient has history of Arrhythmia - afib, Hypertension Endocrine Denies history of Type I Diabetes, Type II Diabetes Genitourinary Denies history of End Stage Renal Disease Integumentary (Skin) Denies history of History of Burn Musculoskeletal Patient has history of Osteoarthritis, Osteomyelitis - pelvic Neurologic Patient has history of Neuropathy Oncologic Denies history of Received Chemotherapy, Received Radiation Psychiatric Patient has history of Confinement Anxiety Denies history of Anorexia/bulimia Hospitalization/Surgery History - TEE. - right ankle fusion. - bil knee replacements. - bil cataract extractions. - posterior lumbar fusion. - vaginal hysterectomy. - cholecystectomy. 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 Vitals Time Taken: 1:13 AM, Height: 59 in, Weight: 155 lbs, BMI: 31.3, Temperature: 98.2 F, Pulse: 73 bpm, Respiratory Rate: 20 breaths/min, Blood Pressure: 120/66 mmHg. Respiratory Normal work of breathing on room air. General Notes: 07/12/2022: The cavity of the buttock ulcer continues to contract. The left ankle wound has [name] little bit  more granulation tissue, but bone remains exposed. The right ankle wound is flush with the surrounding skin. Integumentary (Hair, Skin) Wound #1 status is Open. Original cause of wound was Pressure Injury. The date acquired was: 11/16/2021. The wound has been in treatment 32 weeks. The wound is located on the Right,Medial Malleolus. The wound measures 1.3cm length x 1.5cm width x 0.1cm depth; 1.532cm^2 area and 0.153cm^3 volume. There is Fat Layer (Subcutaneous Tissue) exposed. There is no  tunneling or undermining noted. There is [name] medium amount of serosanguineous drainage noted. The wound margin is distinct with the outline attached to the wound base. There is small (1-33%) red, pink granulation within the wound bed. There is [name] large (67- 100%) amount of necrotic tissue within the wound bed including Adherent Slough. The periwound skin appearance had no abnormalities noted for texture. The periwound skin appearance had no abnormalities noted for moisture. The periwound skin appearance exhibited: Erythema. The surrounding wound skin color is noted with erythema. Erythema is measured at 1 cm. Periwound temperature was noted as No Abnormality. Andrea [name], Andrea [name] ([id number]) [id number] Page 10 of 14 Wound #2 status is Open. Original cause of wound was Bump. The date acquired was: 09/28/2021. The wound has been in treatment 32 weeks. The wound is located on the Right Gluteus. The wound measures 3cm length x 1cm width x 3.6cm depth; 2.356cm^2 area and 8.482cm^3 volume. There is Fat Layer (Subcutaneous Tissue) exposed. There is tunneling at 2:00 with [name] maximum distance of 4.7cm. There is [name] medium amount of serous drainage noted. The wound margin is well defined and not attached to the wound base. There is large (67-100%) pink, pale granulation within the wound bed. There is [name] small (1-33%) amount of necrotic tissue within the wound bed including Adherent Slough. The periwound skin appearance had no abnormalities noted for texture. The periwound skin appearance had no abnormalities noted for moisture. The periwound skin appearance had no abnormalities noted for color. Periwound temperature was noted as No Abnormality. The periwound has tenderness on palpation. Wound #4 status is Open. Original cause of wound was Pressure Injury. The date acquired was: 12/23/2021. The wound has been in treatment 27 weeks. The wound is located on the Left,Lateral Ankle. The wound measures  1.2cm length x 0.9cm width x 0.4cm depth; 0.848cm^2 area and 0.339cm^3 volume. There is bone, tendon, and Fat Layer (Subcutaneous Tissue) exposed. There is no tunneling or undermining noted. There is [name] medium amount of serosanguineous drainage noted. The wound margin is distinct with the outline attached to the wound base. There is no granulation within the wound bed. There is [name] large (67- 100%) amount of necrotic tissue within the wound bed including Adherent Slough. The periwound skin appearance had no abnormalities noted for texture. The periwound skin appearance had no abnormalities noted for moisture. The periwound skin appearance had no abnormalities noted for color. Periwound temperature was noted as No Abnormality. The periwound has tenderness on palpation. 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 There was [name] Selective/Open Wound Non-Viable Tissue Debridement with [name] total area of 1.53 sq cm performed by Duanne Guess, MD. With the following instrument(s): Curette to remove Non-Viable  tissue/material. Material removed includes Biofilm after achieving pain control using Lidocaine 4% Topical Solution. No specimens were taken. [name] time out was conducted at 13:30, prior to the start of the procedure. [name] Minimum amount of bleeding was controlled with Pressure. The procedure was tolerated well with [name] pain level of 0 throughout and [name] pain level of 0 following the procedure. Post Debridement Measurements: 1.3cm length x 1.5cm width x 0.1cm depth; 0.153cm^3 volume. Post  debridement Stage noted as Category/Stage III. Character of Wound/Ulcer Post Debridement is improved. Post procedure Diagnosis Wound #1: Same as Pre-Procedure General Notes: scribed for Dr. Lady Gary by Zenaida Deed, RN. Pre-procedure diagnosis of Wound #1 is [name] Pressure Ulcer located on the Right,Medial Malleolus. [name] skin graft procedure using [name] bioengineered skin substitute/cellular or tissue based product was performed by Duanne Guess, MD with the following instrument(s): Forceps and Scissors. Oasis tri-layer was applied and secured with Steri-Strips. 5 sq cm of product was utilized and 0 sq cm was wasted. Post Application, adaptic, gauze was applied. [name] Time Out was conducted at 13:35, prior to the start of the procedure. The procedure was tolerated well with [name] pain level of 0 throughout and [name] pain level of 0 following the procedure. Post procedure Diagnosis Wound #1: Same as Pre-Procedure . 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 There was [name] Excisional Skin/Subcutaneous Tissue Debridement with [name] total area of 0.85 sq cm performed by Duanne Guess, MD. With the following instrument(s): Curette to remove Viable and Non-Viable tissue/material. Material removed includes Subcutaneous Tissue and Slough and after achieving pain control using Lidocaine 4% T opical Solution. No specimens were taken. [name] time out was conducted at 13:30, prior to the start of the procedure. [name] Minimum amount of bleeding was controlled with Pressure. The procedure was tolerated well with [name] pain level of 0 throughout and [name] pain level of 0 following the procedure. Post Debridement Measurements: 1.2cm length x 0.9cm width x 0.4cm depth; 0.339cm^3 volume. Post debridement Stage noted as Category/Stage IV. Character of Wound/Ulcer Post Debridement is improved. Post procedure Diagnosis Wound #4: Same as Pre-Procedure General Notes: scribed for Dr. Lady Gary by Zenaida Deed, RN. Pre-procedure diagnosis of Wound #4 is [name] Pressure Ulcer located on the Left,Lateral Ankle. [name] skin graft procedure using [name] bioengineered skin substitute/cellular or tissue based product was performed by Duanne Guess, MD with the following instrument(s): Forceps and Scissors. Oasis tri-layer was applied and secured with Steri-Strips. 5 sq cm of product was utilized and 0 sq cm was wasted. Post Application, adaptic, drawtex was applied. [name] Time Out was conducted at 13:35, prior to the start of the procedure. The procedure was tolerated well with [name] pain level of 0 throughout and [name] pain level of 0 following the procedure. Post procedure Diagnosis Wound #4: Same as Pre-Procedure . Plan Follow-up Appointments: Return Appointment in 1 week. - Dr. Lady Gary - Room 2 Wed 6/26 @ 10:00 am Anesthetic: (In clinic) Topical Lidocaine 4% applied to wound bed Cellular or Tissue Based Products: Andrea [name], Andrea [name] ([id number]) [id number] Page 11 of 14 Cellular or Tissue Based Product Type: - [employer] fast streamer or Tissue Based Product applied to wound bed, secured with steri-strips, cover with Adaptic or Mepitel. (DO NOT REMOVE). Bathing/ Shower/ Hygiene: May shower and wash wound with soap and water. Off-Loading: Multipodus Splint to: - prevalon boot to both feet Turn and reposition every 2 hours Home Health: New wound care orders this week; continue Home  Health for wound care. May utilize formulary equivalent dressing for wound treatment orders unless otherwise specified. - may chage outer dressing only on ankles, DO NOT remove sterostrips Dressing changes to be completed by Home Health on Monday / Wednesday / Friday except when patient has scheduled visit at [facility]. Other Home Health Orders/Instructions: - [facility] WOUND #1: - Malleolus Wound Laterality: Right, Medial Cleanser: Normal Saline (Generic) 1 x Per Week/30 Days Discharge Instructions:  Cleanse the wound with Normal Saline prior to applying [name] clean dressing using gauze sponges, not tissue or cotton balls. Cleanser: Byram Ancillary Kit - 15 Day Supply (Generic) 1 x Per Week/30 Days Discharge Instructions: Use supplies as instructed; Kit contains: (15) Saline Bullets; (15) 3x3 Gauze; 15 pr Gloves Peri-Wound Care: Skin Prep (Generic) 1 x Per Week/30 Days Discharge Instructions: Use skin prep as directed Prim Dressing: Oasis tri-layer 1 x Per Week/30 Days ary Secondary Dressing: Zetuvit Plus Silicone Border Dressing 4x4 (in/in) (Generic) 1 x Per Week/30 Days Discharge Instructions: Apply silicone border over primary dressing as directed. Secured With: [employer], 4.5x3.1 (in/yd) 1 x Per Week/30 Days Discharge Instructions: Secure with Kerlix as directed. WOUND #2: - Gluteus Wound Laterality: Right Cleanser: Normal Saline 1 x Per Day/30 Days Discharge Instructions: Cleanse the wound with Normal Saline prior to applying [name] clean dressing using gauze sponges, not tissue or cotton balls. Peri-Wound Care: Skin Prep (Generic) 1 x Per Day/30 Days Discharge Instructions: Use skin prep as directed Prim Dressing: Dakin's Solution 0.25%, 16 (oz) (Generic) 1 x Per Day/30 Days ary Discharge Instructions: Moisten gauze with Dakin's solution Prim Dressing: cotton tipped applicators (Generic) 1 x Per Day/30 Days ary Discharge Instructions: use to pack wound Secondary Dressing: Zetuvit Plus Silicone Border Dressing 5x5 (in/in) (Generic) 1 x Per Day/30 Days Discharge Instructions: Apply silicone border over primary dressing as directed. Secured With: 38M Medipore H Soft Cloth Surgical T ape, 4 x 10 (in/yd) (Generic) 1 x Per Day/30 Days Discharge Instructions: Secure with Andrea as directed. WOUND #4: - Ankle Wound Laterality: Left, Lateral Cleanser: Normal Saline (Generic) 1 x Per Week/30 Days Discharge Instructions: Cleanse the wound with Normal Saline prior to applying [name] clean dressing  using gauze sponges, not tissue or cotton balls. Cleanser: Byram Ancillary Kit - 15 Day Supply (Generic) 1 x Per Week/30 Days Discharge Instructions: Use supplies as instructed; Kit contains: (15) Saline Bullets; (15) 3x3 Gauze; 15 pr Gloves Peri-Wound Care: Skin Prep (Generic) 1 x Per Week/30 Days Discharge Instructions: Use skin prep as directed Prim Dressing: Oasis tri-layer 1 x Per Week/30 Days ary Secondary Dressing: Zetuvit Plus Silicone Border Dressing 4x4 (in/in) (Generic) 1 x Per Week/30 Days Discharge Instructions: Apply silicone border over primary dressing as directed. Secured With: [employer], 4.5x3.1 (in/yd) 1 x Per Week/30 Days Discharge Instructions: Secure with Kerlix as directed. 07/12/2022: The cavity of the buttock ulcer continues to contract. The left ankle wound has [name] little bit more granulation tissue, but bone remains exposed. The right ankle wound is flush with the surrounding skin. No debridement was necessary for the buttock ulcer. We will continue to pack this wound with Dakin's-moistened gauze. I debrided slough and biofilm from the right ankle ulcer and slough and subcutaneous tissue from the left ankle ulcer. The patient has been approved for an Oasis and today was her first application. The Oasis was rehydrated with saline and applied to each of the ankle wounds and turned. On the left, I used drawtex as [name] bolster to  hold it down on the wound surface. This was not necessary on the right. The skin substitute was secured in place with Adaptic and Steri-Strips. She will follow-up in 1 week. Electronic Signature(s) Signed: 07/12/2022 2:14:03 PM By: Duanne Guess MD FACS Entered By: Duanne Guess on 07/12/2022 14:14:03 -------------------------------------------------------------------------------- HxROS Details Patient Name: Date of Service: Andrea [name] 07/12/2022 1:15 PM Medical Record Number: [id number] Patient Account Number: [id number] Date of  Birth/Sex: Treating RN: Dec 20, 1940 (81 y.o. Andrea [name] Primary Care Provider: Jarome Matin Other Clinician: Referring Provider: Treating Provider/Extender: Marena Chancy in Treatment: [address] Information Obtained From Andrea [name], Andrea [name] ([id number]) [id number] Page 12 of 14 Patient Caregiver Chart Constitutional Symptoms (General Health) Medical History: Past Medical History Notes: morbid obesity Eyes Medical History: Positive for: Cataracts - bil extracted Negative for: Glaucoma; Optic Neuritis Ear/Nose/Mouth/Throat Medical History: Past Medical History Notes: hard of hearing Cardiovascular Medical History: Positive for: Arrhythmia - afib; Hypertension Gastrointestinal Medical History: Past Medical History Notes: GERD Endocrine Medical History: Negative for: Type I Diabetes; Type II Diabetes Past Medical History Notes: adrenal insufficiency Genitourinary Medical History: Negative for: End Stage Renal Disease Integumentary (Skin) Medical History: Negative for: History of Burn Musculoskeletal Medical History: Positive for: Osteoarthritis; Osteomyelitis - pelvic Past Medical History Notes: psoriatic arthritis, thoracic discitis, displaced spiral fx right tibia Neurologic Medical History: Positive for: Neuropathy Past Medical History Notes: hx [facility] Oncologic Medical History: Negative for: Received Chemotherapy; Received Radiation Psychiatric Medical History: Positive for: Confinement Anxiety Negative for: Anorexia/bulimia HBO Extended History Items Eyes: Cataracts Immunizations Pneumococcal VaccineLARESSA, Andrea [name] ([id number]) [id number] Page 13 of 14 Received Pneumococcal Vaccination: Yes Received Pneumococcal Vaccination On or After 60th Birthday: Yes Implantable Devices None Hospitalization / Surgery History Type of Hospitalization/Surgery TEE right ankle  fusion bil knee replacements bil cataract extractions posterior lumbar fusion vaginal hysterectomy cholecystectomy Family and Social History Cancer: Yes - Father,Maternal Grandparents,Siblings,Child; Diabetes: No; Heart Disease: Yes - Father; Hereditary Spherocytosis: No; Hypertension: Yes - Father; Kidney Disease: No; Lung Disease: No; Seizures: No; Stroke: No; Thyroid Problems: Yes - Mother; Tuberculosis: No; Former smoker - quit 40 yr ago; Marital Status - Widowed; Alcohol Use: Never; Drug Use: No History; Caffeine Use: Daily - tea, soda; Financial Concerns: No; Food, Clothing or Shelter Needs: No; Support System Lacking: No; Transportation Concerns: No Psychologist, prison and probation services) Signed: 07/12/2022 2:53:14 PM By: Duanne Guess MD FACS Signed: 07/12/2022 5:12:02 PM By: Zenaida Deed RN, BSN Entered By: Duanne Guess on 07/12/2022 14:12:03 -------------------------------------------------------------------------------- SuperBill Details Patient Name: Date of Service: Andrea [name] 07/12/2022 Medical Record Number: [id number] Patient Account Number: [id number] Date of Birth/Sex: Treating RN: 02-04-1940 (81 y.o. 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 L02.31 Cutaneous abscess of buttock E27.40 Unspecified adrenocortical insufficiency Z79.52 Long term (current) use of systemic steroids I10 Essential (primary) hypertension Facility Procedures : CPT4: Code [id number] Q Description: 4124 Oasis Ultra per sq CM Modifier: Quantity: 10 : CPT4: [id number] C o Description: 5271 Application of skin substitute  graft to trunk, arms, legs, total wound surface area up to 100 sq cm; first 25 sqcm r less wound surface area ICD-10 Diagnosis Description 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, stage 3 L89.523 Pressure  ulcer of  left ankle, stage 3 Modifier: Quantity: 1 Physician Procedures : Essie Christine: Description Modifier Code [id number] - WC PHYS LEVEL 3 - EST PT 25 Graclyn [name] ([id number]) [id number] ICD-10 Diagnosis Description 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, stage 3 L89.523 Pressure ulcer of left ankle, stage 3  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

## 2022-07-21 ENCOUNTER — Encounter (HOSPITAL_BASED_OUTPATIENT_CLINIC_OR_DEPARTMENT_OTHER): Payer: Medicare Other | Admitting: General Surgery

## 2022-07-21 DIAGNOSIS — L89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, stage 3: Secondary | ICD-10-CM | POA: Diagnosis not present

## 2022-07-21 NOTE — Progress Notes (Signed)
MARNELL, MCDANIEL [name] ([id number]) [id number] Page 1 of 14 Visit Report for 07/21/2022 Arrival Information Details Patient Name: Date of Service: Andrea [name] 07/21/2022 10:00 [name] Medical Record Number: [id number] Patient Account Number: [id number] Date of Birth/Sex: Treating RN: Aug 03, 1940 (82 y.o. [name] Primary Care Camia Dipinto: Jarome Matin Other Clinician: Referring Audrea Bolte: Treating Taiten Brawn/Extender: Marena Chancy in Treatment: 34 Visit Information History Since Last Visit Added or deleted any medications: No Patient Arrived: Wheel Chair Any new allergies or adverse reactions: No Arrival Time: 10:09 Had [name] fall or experienced change in No Accompanied By: daughter activities of daily living that may affect Transfer Assistance: Manual risk of falls: Patient Identification Verified: Yes Signs or symptoms of abuse/neglect since last visito No Secondary Verification Process Completed: Yes Hospitalized since last visit: No Patient Requires Transmission-Based Precautions: No Implantable device outside of the clinic excluding No Patient Has Alerts: No cellular tissue based products placed in the center since last visit: Has Dressing in Place as Prescribed: Yes Pain Present Now: No Electronic Signature(s) Signed: 07/21/2022 3:47:51 PM By: Samuella Bruin Entered By: Samuella Bruin on 07/21/2022 10:09:54 -------------------------------------------------------------------------------- Encounter Discharge Information Details Patient Name: Date of Service: Andrea Lain [name]. 07/21/2022 10:00 [name] Medical Record Number: [id number] Patient Account Number: [id number] Date of Birth/Sex: Treating RN: [birth year] (82 y.o. [name] Primary Care Aidynn Krenn: Jarome Matin Other Clinician: Referring Tonni Mansour: Treating Davan Hark/Extender: Marena Chancy in Treatment: 34 Encounter  Discharge Information Items Post Procedure Vitals Discharge Condition: Stable Temperature (F): 98 Ambulatory Status: Wheelchair Pulse (bpm): 87 Discharge Destination: Home Respiratory Rate (breaths/min): 16 Transportation: Private Auto Blood Pressure (mmHg): 134/72 Accompanied By: daughter Schedule Follow-up Appointment: Yes Clinical Summary of Care: Patient Declined Electronic Signature(s) Signed: 07/21/2022 3:47:51 PM By: Samuella Bruin Entered By: Samuella Bruin on 07/21/2022 12:56:53 Andrea [name] ([id number]) [id number] Page 2 of 14 -------------------------------------------------------------------------------- Lower Extremity Assessment Details Patient Name: Date of Service: Andrea [name] 07/21/2022 10:00 [name] Medical Record Number: [id number] Patient Account Number: [id number] Date of Birth/Sex: Treating RN: 1940-05-19 (82 y.o. [name] Primary Care Lakeisa Heninger: Jarome Matin Other Clinician: Referring Efe Fazzino: Treating Mckaela Howley/Extender: Marena Chancy in Treatment: 34 Edema Assessment Assessed: Kyra Searles: No] [Right: No] Edema: [Left: No] [Right: No] Calf Left: Right: Point of Measurement: From Medial Instep 31 cm 32 cm Ankle Left: Right: Point of Measurement: From Medial Instep 18 cm 21 cm Vascular Assessment Pulses: Dorsalis Pedis Palpable: [Left:Yes] [Right:Yes] Electronic Signature(s) Signed: 07/21/2022 3:47:51 PM By: Samuella Bruin Entered By: Samuella Bruin on 07/21/2022 10:22:16 -------------------------------------------------------------------------------- Multi Wound Chart Details Patient Name: Date of Service: Andrea Lain [name]. 07/21/2022 10:00 [name] Medical Record Number: [id number] Patient Account Number: [id number] Date of Birth/Sex: Treating RN: 23-Mar-1940 (82 y.o. F) Primary Care Tyan Lasure: Jarome Matin Other Clinician: Referring Sharalyn Lomba: Treating  Servando Kyllonen/Extender: Marena Chancy in Treatment: 34 Vital Signs Height(in): 59 Pulse(bpm): 87 Weight(lbs): 155 Blood Pressure(mmHg): 134/72 Body Mass Index(BMI): 31.3 Temperature(F): 98 Respiratory Rate(breaths/min): 16 [1:Photos:] [id number] Page 3 of 14] Right, Medial Malleolus Right Gluteus Left, Lateral Ankle Wound Location: Pressure Injury Bump Pressure Injury Wounding Event: Pressure Ulcer Abscess Pressure Ulcer Primary Etiology: Cataracts, Arrhythmia, Hypertension,Cataracts, Arrhythmia, Hypertension, Cataracts, Arrhythmia, Hypertension, Comorbid History: Osteoarthritis, Osteomyelitis, Osteoarthritis, Osteomyelitis, Osteoarthritis, Osteomyelitis, Neuropathy, Confinement Anxiety Neuropathy, Confinement Anxiety Neuropathy, Confinement Anxiety 11/16/2021 09/28/2021 12/23/2021 Date Acquired: 34 34 28 Weeks of Treatment: Open Open Open Wound Status: No No No Wound Recurrence: 1.5x1x0.1 1x2x3.5 1.1x1x0.3 Measurements  L x W x D (cm) 1.178 1.571 0.864 [name] (cm) : rea 0.118 5.498 0.259 Volume (cm) : 23.50% 85.00% -512.80% % Reduction in [name] rea: 23.40% 82.50% -1750.00% % Reduction in Volume: Category/Stage III Full Thickness With Exposed Support Category/Stage IV Classification: Structures Medium Medium Medium Exudate [name] mount: Serosanguineous Serous Serosanguineous Exudate Type: red, brown amber red, brown Exudate Color: Distinct, outline attached Well defined, not attached Distinct, outline attached Wound Margin: Medium (34-66%) Large (67-100%) None Present (0%) Granulation [name] mount: Red, Pink Pink, Pale N/[name] Granulation Quality: Medium (34-66%) Small (1-33%) Large (67-100%) Necrotic [name] mount: Fat Layer (Subcutaneous Tissue): Yes Fat Layer (Subcutaneous Tissue): Yes Fat Layer (Subcutaneous Tissue): Yes Exposed Structures: Fascia: No Fascia: No Tendon: Yes Tendon: No Tendon: No Bone: Yes Muscle: No Muscle:  No Fascia: No Joint: No Joint: No Muscle: No Bone: No Bone: No Joint: No Small (1-33%) None Small (1-33%) Epithelialization: Debridement - Selective/Open Wound N/[name] Debridement - Selective/Open Wound Debridement: Pre-procedure Verification/Time Out 10:40 N/[name] 10:40 Taken: Lidocaine 4% Topical Solution N/[name] Lidocaine 4% Topical Solution Pain Control: Slough N/[name] Slough Tissue Debrided: Non-Viable Tissue N/[name] Non-Viable Tissue Level: 1.18 N/[name] 0.86 Debridement [name] (sq cm): rea Curette N/[name] Curette Instrument: Minimum N/[name] Minimum Bleeding: Pressure N/[name] Pressure Hemostasis [name] chieved: Procedure was tolerated well N/[name] Procedure was tolerated well Debridement Treatment Response: 1.5x1x0.1 N/[name] 1.1x1x0.3 Post Debridement Measurements L x W x D (cm) 0.118 N/[name] 0.259 Post Debridement Volume: (cm) Category/Stage III N/[name] Category/Stage IV Post Debridement Stage: No Abnormalities Noted No Abnormalities Noted No Abnormalities Noted Periwound Skin Texture: No Abnormalities Noted No Abnormalities Noted No Abnormalities Noted Periwound Skin Moisture: Erythema: Yes Erythema: No Erythema: Yes Periwound Skin Color: Rubor: Yes N/[name] N/[name] Circumferential Erythema Location: Measured: 2.5cm N/[name] Measured: 1.4cm Erythema Measurement: Increased N/[name] N/[name] Erythema Change: No Abnormality No Abnormality No Abnormality Temperature: N/[name] N/[name] Yes Tenderness on Palpation: Cellular or Tissue Based Product N/[name] Cellular or Tissue Based Product Procedures Performed: Debridement Debridement Wound Number: 5 6 N/[name] Photos: N/[name] Right Lower Leg Right, Distal, Anterior Lower Leg N/[name] Wound Location: Skin Tear/Laceration Skin Tear/Laceration N/[name] Wounding Event: Skin Tear Skin Tear N/[name] Primary Etiology: Cataracts, Arrhythmia, Hypertension, Cataracts, Arrhythmia, Hypertension, N/[name] Comorbid History: Osteoarthritis, Osteomyelitis, Osteoarthritis, Osteomyelitis, Neuropathy, Confinement Anxiety Neuropathy,  Confinement Anxiety 07/16/2022 07/16/2022 N/[name] Date Acquired: 0 0 N/[name] Weeks of Treatment: Open Open N/[name] Wound Status: No No N/[name] Wound Recurrence: 1x1.7x0.1 1.4x1.1x0.1 N/[name] Measurements L x W x D (cm) 1.335 1.21 N/[name] [name] (cm) : rea 0.134 0.121 N/[name] Volume (cm) : N/[name] N/[name] N/[name] % Reduction in Area: N/[name] N/[name] N/[name] % Reduction in Volume: Full Thickness Without Exposed Full Thickness Without Exposed N/[name] ClassificationNAVIAH, Andrea [name] ([id number]) [id number] Page 4 of 14 Support Structures Support Structures Medium Medium N/[name] Exudate Amount: Serous Serous N/[name] Exudate Type: Media planner N/[name] Exudate Color: Distinct, outline attached Distinct, outline attached N/[name] Wound Margin: Large (67-100%) Small (1-33%) N/[name] Granulation Amount: Red Red N/[name] Granulation Quality: Small (1-33%) Large (67-100%) N/[name] Necrotic Amount: Fat Layer (Subcutaneous Tissue): Yes Fat Layer (Subcutaneous Tissue): Yes N/[name] Exposed Structures: Fascia: No Fascia: No Tendon: No Tendon: No Muscle: No Muscle: No Joint: No Joint: No Bone: No Bone: No Small (1-33%) Small (1-33%) N/[name] Epithelialization: Debridement - Excisional Debridement - Selective/Open Wound N/[name] Debridement: Pre-procedure Verification/Time Out 10:40 10:40 N/[name] Taken: Lidocaine 4% Topical Solution Lidocaine 4% Topical Solution N/[name] Pain Control: Subcutaneous, [employer] N/[name] Tissue Debrided: Skin/Subcutaneous Tissue Non-Viable Tissue N/[name] Level: 1.33 1.21 N/[name] Debridement [name] (sq cm): rea Curette Curette N/[name]  Instrument: Minimum Minimum N/[name] Bleeding: Pressure Pressure N/[name] Hemostasis [name] chieved: Procedure was tolerated well Procedure was tolerated well N/[name] Debridement Treatment Response: 1x1.7x0.1 1.4x1.1x0.1 N/[name] Post Debridement Measurements L x W x D (cm) 0.134 0.121 N/[name] Post Debridement Volume: (cm) N/[name] N/[name] N/[name] Post Debridement Stage: No Abnormalities Noted No Abnormalities Noted N/[name] Periwound Skin  Texture: No Abnormalities Noted No Abnormalities Noted N/[name] Periwound Skin Moisture: No Abnormalities Noted No Abnormalities Noted N/[name] Periwound Skin Color: N/[name] N/[name] N/[name] Erythema Location: N/[name] N/[name] N/[name] Erythema Measurement: N/[name] N/[name] N/[name] Erythema Change: No Abnormality No Abnormality N/[name] Temperature: Debridement Debridement N/[name] Procedures Performed: Treatment Notes Electronic Signature(s) Signed: 07/21/2022 10:59:07 AM By: Duanne Guess MD FACS Entered By: Duanne Guess on 07/21/2022 10:59:07 -------------------------------------------------------------------------------- Multi-Disciplinary Care Plan Details Patient Name: Date of Service: Andrea Lain [name]. 07/21/2022 10:00 [name] Medical Record Number: [id number] Patient Account Number: [id number] Date of Birth/Sex: Treating RN: 1940-11-20 (82 y.o. [name] Primary Care Chemere Steffler: Jarome Matin Other Clinician: Referring Merilyn Pagan: Treating Tomia Enlow/Extender: Marena Chancy in Treatment: 34 Multidisciplinary Care Plan reviewed with physician Active Inactive Pressure Nursing Diagnoses: Knowledge deficit related to causes and risk factors for pressure ulcer development Knowledge deficit related to management of pressures ulcers Potential for impaired tissue integrity related to pressure, friction, moisture, and shear Goals: Patient/caregiver will verbalize understanding of pressure ulcer management Date Initiated: 11/24/2021 Target Resolution Date: 08/09/2022 Goal Status: Active Andrea [name], Andrea [name] ([id number]) [id number] Page 5 of 14 Interventions: Assess: immobility, friction, shearing, incontinence upon admission and as needed Assess offloading mechanisms upon admission and as needed Assess potential for pressure ulcer upon admission and as needed Provide education on pressure ulcers Treatment Activities: Pressure reduction/relief device ordered :  11/24/2021 Notes: Wound/Skin Impairment Nursing Diagnoses: Impaired tissue integrity Knowledge deficit related to ulceration/compromised skin integrity Goals: Patient/caregiver will verbalize understanding of skin care regimen Date Initiated: 11/24/2021 Target Resolution Date: 08/09/2022 Goal Status: Active Ulcer/skin breakdown will have [name] volume reduction of 30% by week 4 Date Initiated: 11/24/2021 Date Inactivated: 05/31/2022 Target Resolution Date: 05/25/2022 Unmet Reason: refuses VAC, Goal Status: Unmet noncompliant with offloading Interventions: Assess patient/caregiver ability to obtain necessary supplies Assess patient/caregiver ability to perform ulcer/skin care regimen upon admission and as needed Assess ulceration(s) every visit Provide education on ulcer and skin care Treatment Activities: Skin care regimen initiated : 11/24/2021 Topical wound management initiated : 11/24/2021 Notes: Electronic Signature(s) Signed: 07/21/2022 3:47:51 PM By: Samuella Bruin Entered By: Samuella Bruin on 07/21/2022 10:51:17 -------------------------------------------------------------------------------- Pain Assessment Details Patient Name: Date of Service: Andrea Lain [name]. 07/21/2022 10:00 [name] Medical Record Number: [id number] Patient Account Number: [id number] Date of Birth/Sex: Treating RN: September 12, 1940 (82 y.o. [name] Primary Care Milam Allbaugh: Jarome Matin Other Clinician: Referring Marykathleen Russi: Treating Eylin Pontarelli/Extender: Marena Chancy in Treatment: 34 Active Problems Location of Pain Severity and Description of Pain Patient Has Paino No Site Locations Rate the pain. LEGACIE, DILLINGHAM [name] ([id number]) [id number] Page 6 of 14 Rate the pain. Current Pain Level: 0 Pain Management and Medication Current Pain Management: Electronic Signature(s) Signed: 07/21/2022 3:47:51 PM By: Samuella Bruin Entered By:  Samuella Bruin on 07/21/2022 10:10:02 -------------------------------------------------------------------------------- Patient/Caregiver Education Details Patient Name: Date of Service: Andrea [name] 6/26/2024andnbsp10:00 [name] Medical Record Number: [id number] Patient Account Number: [id number] Date of Birth/Gender: Treating RN: 15-Dec-1940 (82 y.o. [name] Primary Care Physician: Jarome Matin Other Clinician: Referring Physician: Treating Physician/Extender: Marena Chancy in Treatment: 72 Education Assessment Education Provided To:  Patient Education Topics Provided Wound/Skin Impairment: Methods: Explain/Verbal Responses: Reinforcements needed, State content correctly Electronic Signature(s) Signed: 07/21/2022 3:47:51 PM By: Samuella Bruin Entered By: Samuella Bruin on 07/21/2022 10:51:28 -------------------------------------------------------------------------------- Wound Assessment Details Patient Name: Date of Service: Andrea Lain [name]. 07/21/2022 10:00 [name] Medical Record Number: [id number] Patient Account Number: [id number] Date of Birth/Sex: Treating RN: 12/15/1940 (82 y.o. [name] Primary Care Marylon Verno: Jarome Matin Other Clinician: Referring Ayzia Day: Treating Kortlynn Poust/Extender: Lanier, Felty ([id number]) [id number] Page 7 of 14 Weeks in Treatment: 34 Wound Status Wound Number: 1 Primary Pressure Ulcer Etiology: Wound Location: Right, Medial Malleolus Wound Open Wounding Event: Pressure Injury Status: Date Acquired: 11/16/2021 Comorbid Cataracts, Arrhythmia, Hypertension, Osteoarthritis, Weeks Of Treatment: 34 History: Osteomyelitis, Neuropathy, Confinement Anxiety Clustered Wound: No Photos Wound Measurements Length: (cm) 1.5 Width: (cm) 1 Depth: (cm) 0.1 Area: (cm) 1.178 Volume: (cm) 0.118 % Reduction in Area: 23.5% %  Reduction in Volume: 23.4% Epithelialization: Small (1-33%) Tunneling: No Undermining: No Wound Description Classification: Category/Stage III Wound Margin: Distinct, outline attached Exudate Amount: Medium Exudate Type: Serosanguineous Exudate Color: red, brown Foul Odor After Cleansing: No Slough/Fibrino Yes Wound Bed Granulation Amount: Medium (34-66%) Exposed Structure Granulation Quality: Red, Pink Fascia Exposed: No Necrotic Amount: Medium (34-66%) Fat Layer (Subcutaneous Tissue) Exposed: Yes Necrotic Quality: Adherent Slough Tendon Exposed: No Muscle Exposed: No Joint Exposed: No Bone Exposed: No Periwound Skin Texture Texture Color No Abnormalities Noted: Yes No Abnormalities Noted: No Erythema: Yes Moisture Erythema Measurement: Measured No Abnormalities Noted: Yes 2.5 cm Erythema Change: Increased Temperature / Pain Temperature: No Abnormality Treatment Notes Wound #1 (Malleolus) Wound Laterality: Right, Medial Cleanser Normal Saline Discharge Instruction: Cleanse the wound with Normal Saline prior to applying [name] clean dressing using gauze sponges, not tissue or cotton balls. Byram Ancillary Kit - 15 Day Supply Discharge Instruction: Use supplies as instructed; Kit contains: (15) Saline Bullets; (15) 3x3 Gauze; 15 pr Gloves Peri-Wound Care Skin Prep Discharge Instruction: Use skin prep as directed Topical Andrea [name], Andrea [name] ([id number]) [id number] Page 8 of 14 Primary Dressing Cutimed Sorbact Swab Discharge Instruction: Apply to wound bed as instructed [employer] tri-layer Secondary Dressing Secured With [employer] Sterile, 4.5x3.1 (in/yd) Discharge Instruction: Secure with Kerlix as directed. Compression Wrap Compression Stockings Add-Ons Electronic Signature(s) Signed: 07/21/2022 3:47:51 PM By: Samuella Bruin Entered By: Samuella Bruin on 07/21/2022  10:31:54 -------------------------------------------------------------------------------- Wound Assessment Details Patient Name: Date of Service: Andrea Lain [name]. 07/21/2022 10:00 [name] Medical Record Number: [id number] Patient Account Number: [id number] Date of Birth/Sex: Treating RN: 30-Jan-1940 (82 y.o. [name] Primary Care Kymberlee Viger: Jarome Matin Other Clinician: Referring Kingdom Vanzanten: Treating Levaughn Puccinelli/Extender: Marena Chancy in Treatment: 34 Wound Status Wound Number: 2 Primary Abscess Etiology: Wound Location: Right Gluteus Wound Open Wounding Event: Bump Status: Date Acquired: 09/28/2021 Comorbid Cataracts, Arrhythmia, Hypertension, Osteoarthritis, Weeks Of Treatment: 34 History: Osteomyelitis, Neuropathy, Confinement Anxiety Clustered Wound: No Photos Wound Measurements Length: (cm) 1 Width: (cm) 2 Depth: (cm) 3.5 Area: (cm) 1.571 Volume: (cm) 5.498 % Reduction in Area: 85% % Reduction in Volume: 82.5% Epithelialization: None Tunneling: No Undermining: No Wound Description Classification: Full Thickness With Exposed Support Structures Wound Margin: Well defined, not attached Exudate Amount: Medium Exudate Type: Serous Exudate Color: amber Foul Odor After Cleansing: No Slough/Fibrino Yes Wound Bed Andrea [name], Andrea [name] ([id number]) [id number] Page 9 of 14 Granulation Amount: Large (67-100%) Exposed Structure Granulation Quality: Pink, Pale Fascia Exposed: No Necrotic Amount: Small (1-33%) Fat Layer (Subcutaneous Tissue) Exposed: Yes Necrotic  Quality: Adherent Slough Tendon Exposed: No Muscle Exposed: No Joint Exposed: No Bone Exposed: No Periwound Skin Texture Texture Color No Abnormalities Noted: Yes No Abnormalities Noted: Yes Moisture Temperature / Pain No Abnormalities Noted: Yes Temperature: No Abnormality Treatment Notes Wound #2 (Gluteus) Wound Laterality: Right Cleanser Normal  Saline Discharge Instruction: Cleanse the wound with Normal Saline prior to applying [name] clean dressing using gauze sponges, not tissue or cotton balls. Peri-Wound Care Skin Prep Discharge Instruction: Use skin prep as directed Topical Primary Dressing Dakin's Solution 0.25%, 16 (oz) Discharge Instruction: Moisten gauze with Dakin's solution cotton tipped applicators Discharge Instruction: use to pack wound Secondary Dressing Zetuvit Plus Silicone Border Dressing 5x5 (in/in) Discharge Instruction: Apply silicone border over primary dressing as directed. Secured With 23M Medipore H Soft Cloth Surgical T ape, 4 x 10 (in/yd) Discharge Instruction: Secure with tape as directed. Compression Wrap Compression Stockings Add-Ons Electronic Signature(s) Signed: 07/21/2022 3:47:51 PM By: Samuella Bruin Entered By: Samuella Bruin on 07/21/2022 10:30:49 -------------------------------------------------------------------------------- Wound Assessment Details Patient Name: Date of Service: Andrea Lain [name]. 07/21/2022 10:00 [name] Medical Record Number: [id number] Patient Account Number: [id number] Date of Birth/Sex: Treating RN: 02/25/1940 (82 y.o. [name] Primary Care Batya Citron: Jarome Matin Other Clinician: Referring Griselda Bramblett: Treating Shaconda Hajduk/Extender: Marena Chancy in Treatment: 34 Wound Status Wound Number: 4 Primary Pressure Ulcer Etiology: Wound Location: Left, Lateral Ankle Wound Open Wounding Event: Pressure Injury Status: Date Acquired: 12/23/2021 Comorbid Cataracts, Arrhythmia, Hypertension, Osteoarthritis, Weeks Of Treatment: [address] [name] ([id number]) [id number] Page 10 of 14 Weeks Of Treatment: 28 History: Osteomyelitis, Neuropathy, Confinement Anxiety Clustered Wound: No Photos Wound Measurements Length: (cm) 1.1 Width: (cm) 1 Depth: (cm) 0.3 Area: (cm) 0.864 Volume: (cm) 0.259 %  Reduction in Area: -512.8% % Reduction in Volume: -1750% Epithelialization: Small (1-33%) Tunneling: No Undermining: No Wound Description Classification: Category/Stage IV Wound Margin: Distinct, outline attached Exudate Amount: Medium Exudate Type: Serosanguineous Exudate Color: red, brown Foul Odor After Cleansing: No Slough/Fibrino Yes Wound Bed Granulation Amount: None Present (0%) Exposed Structure Necrotic Amount: Large (67-100%) Fascia Exposed: No Necrotic Quality: Adherent Slough Fat Layer (Subcutaneous Tissue) Exposed: Yes Tendon Exposed: Yes Muscle Exposed: No Joint Exposed: No Bone Exposed: Yes Periwound Skin Texture Texture Color No Abnormalities Noted: Yes No Abnormalities Noted: No Erythema: Yes Moisture Erythema Location: Circumferential No Abnormalities Noted: Yes Erythema Measurement: Measured 1.4 cm Rubor: Yes Temperature / Pain Temperature: No Abnormality Tenderness on Palpation: Yes Treatment Notes Wound #4 (Ankle) Wound Laterality: Left, Lateral Cleanser Normal Saline Discharge Instruction: Cleanse the wound with Normal Saline prior to applying [name] clean dressing using gauze sponges, not tissue or cotton balls. Byram Ancillary Kit - 15 Day Supply Discharge Instruction: Use supplies as instructed; Kit contains: (15) Saline Bullets; (15) 3x3 Gauze; 15 pr Gloves Peri-Wound Care Skin Prep Discharge Instruction: Use skin prep as directed Topical Primary Dressing Cutimed Sorbact Swab Discharge Instruction: Apply to wound bed as instructed [facility] tri-layer Secondary Dressing Andrea [name], Andrea [name] ([id number]) [id number] Page 11 of 14 ABD Pad, 5x9 Discharge Instruction: Apply over primary dressing as directed. Secured With [employer], 4.5x3.1 (in/yd) Discharge Instruction: Secure with Kerlix as directed. Compression Wrap Compression Stockings Add-Ons Electronic Signature(s) Signed: 07/21/2022 3:47:51 PM By:  Samuella Bruin Entered By: Samuella Bruin on 07/21/2022 10:32:18 -------------------------------------------------------------------------------- Wound Assessment Details Patient Name: Date of Service: Andrea Lain [name]. 07/21/2022 10:00 [name] Medical Record Number: [id number] Patient Account Number: [id number] Date of Birth/Sex: Treating RN: July 14, 1940 (82 y.o. [name]  Primary Care Daviona Herbert: Jarome Matin Other Clinician: Referring Garry Bochicchio: Treating Laroy Mustard/Extender: Marena Chancy in Treatment: 34 Wound Status Wound Number: 5 Primary Skin Tear Etiology: Wound Location: Right Lower Leg Wound Open Wounding Event: Skin Tear/Laceration Status: Date Acquired: 07/16/2022 Comorbid Cataracts, Arrhythmia, Hypertension, Osteoarthritis, Weeks Of Treatment: 0 History: Osteomyelitis, Neuropathy, Confinement Anxiety Clustered Wound: No Photos Wound Measurements Length: (cm) 1 Width: (cm) 1.7 Depth: (cm) 0.1 Area: (cm) 1.335 Volume: (cm) 0.134 % Reduction in Area: % Reduction in Volume: Epithelialization: Small (1-33%) Tunneling: No Undermining: No Wound Description Classification: Full Thickness Without Exposed Support Structures Wound Margin: Distinct, outline attached Exudate Amount: Medium Exudate Type: Serous Exudate Color: amber Foul Odor After Cleansing: No Slough/Fibrino Yes Wound Bed Granulation Amount: Large (67-100%) Exposed Structure Granulation Quality: Red Fascia Exposed: No Necrotic Amount: Small (1-33%) Fat Layer (Subcutaneous Tissue) Exposed: Yes Andrea [name], Andrea [name] ([id number]) [id number] Page 12 of 14 Necrotic Quality: Adherent Slough Tendon Exposed: No Muscle Exposed: No Joint Exposed: No Bone Exposed: No Periwound Skin Texture Texture Color No Abnormalities Noted: Yes No Abnormalities Noted: Yes Moisture Temperature / Pain No Abnormalities Noted: Yes Temperature: No  Abnormality Treatment Notes Wound #5 (Lower Leg) Wound Laterality: Right Cleanser Soap and Water Discharge Instruction: May shower and wash wound with dial antibacterial soap and water prior to dressing change. Wound Cleanser Discharge Instruction: Cleanse the wound with wound cleanser prior to applying [name] clean dressing using gauze sponges, not tissue or cotton balls. Peri-Wound Care Topical Primary Dressing Maxorb Extra Ag+ Alginate Dressing, 2x2 (in/in) Discharge Instruction: Apply to wound bed as instructed Secondary Dressing Zetuvit Plus Silicone Border Dressing 4x4 (in/in) Discharge Instruction: Apply silicone border over primary dressing as directed. Secured With Compression Wrap Compression Stockings Facilities manager) Signed: 07/21/2022 3:47:51 PM By: Samuella Bruin Entered By: Samuella Bruin on 07/21/2022 10:32:38 -------------------------------------------------------------------------------- Wound Assessment Details Patient Name: Date of Service: Andrea Lain [name]. 07/21/2022 10:00 [name] Medical Record Number: [id number] Patient Account Number: [id number] Date of Birth/Sex: Treating RN: [birth year] (82 y.o. [name] Primary Care Fritzie Prioleau: Jarome Matin Other Clinician: Referring Larken Urias: Treating Corda Shutt/Extender: Marena Chancy in Treatment: 34 Wound Status Wound Number: 6 Primary Skin Tear Etiology: Wound Location: Right, Distal, Anterior Lower Leg Wound Open Wounding Event: Skin Tear/Laceration Status: Date Acquired: 07/16/2022 Comorbid Cataracts, Arrhythmia, Hypertension, Osteoarthritis, Weeks Of Treatment: 0 History: Osteomyelitis, Neuropathy, Confinement Anxiety Clustered Wound: No Photos Andrea [name], Andrea [name] ([id number]) [phone].pdf Page 13 of 14 Wound Measurements Length: (cm) 1.4 Width: (cm) 1.1 Depth: (cm) 0.1 Area: (cm) 1.21 Volume: (cm) 0.121 % Reduction in Area: %  Reduction in Volume: Epithelialization: Small (1-33%) Tunneling: No Undermining: No Wound Description Classification: Full Thickness Without Exposed Support Structures Wound Margin: Distinct, outline attached Exudate Amount: Medium Exudate Type: Serous Exudate Color: amber Foul Odor After Cleansing: No Slough/Fibrino Yes Wound Bed Granulation Amount: Small (1-33%) Exposed Structure Granulation Quality: Red Fascia Exposed: No Necrotic Amount: Large (67-100%) Fat Layer (Subcutaneous Tissue) Exposed: Yes Necrotic Quality: Adherent Slough Tendon Exposed: No Muscle Exposed: No Joint Exposed: No Bone Exposed: No Periwound Skin Texture Texture Color No Abnormalities Noted: Yes No Abnormalities Noted: Yes Moisture Temperature / Pain No Abnormalities Noted: Yes Temperature: No Abnormality Treatment Notes Wound #6 (Lower Leg) Wound Laterality: Right, Anterior, Distal Cleanser Soap and Water Discharge Instruction: May shower and wash wound with dial antibacterial soap and water prior to dressing change. Wound Cleanser Discharge Instruction: Cleanse the wound with wound cleanser prior to applying [name] clean dressing using gauze sponges,  not tissue or cotton balls. Peri-Wound Care Topical Primary Dressing Maxorb Extra Ag+ Alginate Dressing, 2x2 (in/in) Discharge Instruction: Apply to wound bed as instructed Secondary Dressing Zetuvit Plus Silicone Border Dressing 4x4 (in/in) Discharge Instruction: Apply silicone border over primary dressing as directed. Secured With Compression Wrap Compression Stockings Facilities manager) Signed: 07/21/2022 3:47:51 PM By: Kellie Shropshire ([id number]) [id number] Page 14 of 14 Entered By: Samuella Bruin on 07/21/2022 10:32:54 -------------------------------------------------------------------------------- Vitals Details Patient Name: Date of Service: Andrea [name] 07/21/2022 10:00 [name]  M Medical Record Number: [id number] Patient Account Number: [id number] Date of Birth/Sex: Treating RN: April 11, 1940 (82 y.o. [name] Primary Care Clayborne Divis: Jarome Matin Other Clinician: Referring Roshon Duell: Treating Reshanda Lewey/Extender: Marena Chancy in Treatment: 34 Vital Signs Time Taken: 10:12 Temperature (F): 98 Height (in): 59 Pulse (bpm): 87 Weight (lbs): 155 Respiratory Rate (breaths/min): 16 Body Mass Index (BMI): 31.3 Blood Pressure (mmHg): 134/72 Reference Range: 80 - 120 mg / dl Electronic Signature(s) Signed: 07/21/2022 3:47:51 PM By: Samuella Bruin Entered By: Samuella Bruin on 07/21/2022 10:12:58

## 2022-07-21 NOTE — Progress Notes (Addendum)
Andrea, [name] [name] ([id number]) [id number] Page 1 of 17 Visit Report for 07/21/2022 Chief Complaint Document Details Patient Name: Date of Service: Andrea [name] 07/21/2022 10:00 [name] Medical Record Number: [id number] Patient Account Number: [id number] Date of Birth/Sex: Treating RN: Dec 31, 1940 (82 y.o. 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 07/21/2022 10:00 [name] Medical Record Number: [id number] Patient Account Number: [id number] Date of Birth/Sex: Treating RN: 1941-01-16 (82 y.o. Andrea [name] Primary Care Provider: Jarome Matin Other Clinician: Referring Provider: Treating Provider/Extender: Marena Chancy in Treatment: 34 Cellular or Tissue Based Product Type Wound #1 Right,Medial Malleolus Applied to: Performed By: Physician Duanne Guess, MD Cellular or Tissue Based Product Type: Oasis tri-layer Level of Consciousness (Pre-procedure): Awake and Alert Pre-procedure Verification/Time Out Yes - 10:47 Taken: Location: trunk / arms / legs Wound Size (sq cm): 1.5 Product Size (sq cm): 5 Waste Size (sq cm): 1 Waste Reason: wound size Amount of Product Applied (sq cm): 4 Instrument Used: Forceps, Scissors Lot #: [id number] Expiration Date: 05/04/2023 Fenestrated: No Reconstituted: Yes Solution Type: normal  saline Solution Amount: 4 ml Lot #: [id number] KS Solution Expiration Date: 02/16/2024 Secured: Yes Secured With: Steri-Strips Dressing Applied: Yes Primary Dressing: sorbact Procedural Pain: 0 Post Procedural Pain: 0 Response to Treatment: Procedure was tolerated well Level of Consciousness (Post- Awake and Alert Andrea, [name] [name] ([id number]) [id number] Page 2 of 17 procedure): Post Procedure Diagnosis Same as Pre-procedure Notes scribed for Dr. Lady Gary by Samuella Bruin, RN Electronic Signature(s) Signed: 07/21/2022 2:01:12 PM By: Duanne Guess MD FACS Signed: 07/21/2022 3:47:51 PM By: Gelene Mink By: Samuella Bruin on 07/21/2022 10:52:39 -------------------------------------------------------------------------------- Cellular or Tissue Based Product Details Patient Name: Date of Service: Andrea [name]. 07/21/2022 10:00 [name] Medical Record Number: [id number] Patient Account Number: [id number] Date of Birth/Sex: Treating RN: [birth year] (81 y.o. Andrea [name] Primary Care Provider: Jarome Matin Other Clinician: Referring Provider: Treating Provider/Extender: Marena Chancy in Treatment: 34 Cellular or Tissue Based Product Type Wound #4 Left,Lateral Ankle Applied to: Performed By: Physician Duanne Guess, MD Cellular or Tissue Based Product Type: Oasis tri-layer Level of Consciousness (Pre-procedure): Awake and Alert Pre-procedure Verification/Time Out Yes - 10:47 Taken: Location: trunk / arms / legs Wound Size (sq cm): 1.1 Product Size (sq cm): 5 Waste Size (sq cm): 1 Waste Reason: wound size Amount of Product Applied (sq cm): 4 Instrument Used: Forceps, Scissors Lot #: [id number] Expiration Date: 05/04/2023 Fenestrated: No Reconstituted: Yes Solution Type: normal saline Solution Amount: 4 ml Lot #: [id number] KS Solution Expiration Date: 02/16/2024 Secured: Yes Secured With:  Steri-Strips Dressing Applied: Yes Primary Dressing: sorbact Procedural Pain: 0 Post Procedural Pain: 0 Response to Treatment: Procedure was tolerated well Level of Consciousness (Post- Awake and Alert procedure): Post Procedure Diagnosis Same as Pre-procedure Notes scribed for Dr. Lady Gary by Samuella Bruin, RN Electronic Signature(s) Signed: 07/21/2022 2:01:12 PM By: Duanne Guess MD FACS Signed: 07/21/2022 3:47:51 PM By: Samuella Bruin Entered By: Samuella Bruin on 07/21/2022 10:53:06 Andrea [name] ([id number]) [id number] Page 3  of 17 -------------------------------------------------------------------------------- Debridement Details Patient Name: Date of Service: Andrea [name] 07/21/2022 10:00 [name] Medical Record Number: [id number] Patient Account Number: [id number] Date of Birth/Sex: Treating RN: 09/04/1940 (82 y.o. Andrea [name] Primary Care Provider: Jarome Matin Other Clinician: Referring Provider: Treating Provider/Extender: Marena Chancy in Treatment: 34 Debridement Performed for Assessment: Wound #6 Right,Distal,Anterior Lower Leg Performed By: Physician Duanne Guess, MD Debridement Type: Debridement Level of Consciousness (Pre-procedure): Awake and Alert Pre-procedure Verification/Time Out Yes - 10:40 Taken: Start Time: 10:40 Pain Control: Lidocaine 4% T opical Solution Percent of Wound Bed Debrided: 100% T Area Debrided (cm): otal 1.21 Tissue and other material debrided: Non-Viable, Slough, Slough Level: Non-Viable Tissue Debridement Description: Selective/Open Wound Instrument: Curette Bleeding: Minimum Hemostasis Achieved: Pressure Response to Treatment: Procedure was tolerated well Level of Consciousness (Post- Awake and Alert procedure): Post Debridement Measurements of Total Wound Length: (cm) 1.4 Width: (cm) 1.1 Depth: (cm) 0.1 Volume: (cm) 0.121 Character of  Wound/Ulcer Post Debridement: Improved Post Procedure Diagnosis Same as Pre-procedure Notes scribed for Dr. Lady Gary by Samuella Bruin, RN Electronic Signature(s) Signed: 07/21/2022 2:01:12 PM By: Duanne Guess MD FACS Signed: 07/21/2022 3:47:51 PM By: Samuella Bruin Entered By: Samuella Bruin on 07/21/2022 10:41:48 -------------------------------------------------------------------------------- Debridement Details Patient Name: Date of Service: Andrea [name]. 07/21/2022 10:00 [name] Medical Record Number: [id number] Patient Account Number: [id number] Date of Birth/Sex: Treating RN: [birth year] (82 y.o. Andrea [name] Primary Care Provider: Jarome Matin Other Clinician: Referring Provider: Treating Provider/Extender: Marena Chancy in Treatment: 34 Debridement Performed for Assessment: Wound #5 Right Lower Leg Performed By: Physician Duanne Guess, MD Debridement Type: Debridement Level of Consciousness (Pre-procedure): Awake and Alert Pre-procedure Verification/Time Out Yes - 10:40 Taken: Start Time: 10:40 Andrea [name], Andrea [name] ([id number]) [id number] Page 4 of 17 Pain Control: Lidocaine 4% T opical Solution Percent of Wound Bed Debrided: 100% T Area Debrided (cm): otal 1.33 Tissue and other material debrided: Non-Viable, Slough, Subcutaneous, Slough Level: Skin/Subcutaneous Tissue Debridement Description: Excisional Instrument: Curette Bleeding: Minimum Hemostasis Achieved: Pressure Response to Treatment: Procedure was tolerated well Level of Consciousness (Post- Awake and Alert procedure): Post Debridement Measurements of Total Wound Length: (cm) 1 Width: (cm) 1.7 Depth: (cm) 0.1 Volume: (cm) 0.134 Character of Wound/Ulcer Post Debridement: Improved Post Procedure Diagnosis Same as Pre-procedure Notes scribed for Dr. Lady Gary by Samuella Bruin, RN Electronic Signature(s) Signed: 07/21/2022  2:01:12 PM By: Duanne Guess MD FACS Signed: 07/21/2022 3:47:51 PM By: Samuella Bruin Entered By: Samuella Bruin on 07/21/2022 10:42:06 -------------------------------------------------------------------------------- Debridement Details Patient Name: Date of Service: Andrea [name]. 07/21/2022 10:00 [name] Medical Record Number: [id number] Patient Account Number: [id number] Date of Birth/Sex: Treating RN: 1940-05-10 (82 y.o. Andrea [name] Primary Care Provider: Jarome Matin Other Clinician: Referring Provider: Treating Provider/Extender: Marena Chancy in Treatment: 34 Debridement Performed for Assessment: Wound #1 Right,Medial Malleolus Performed By: Physician Duanne Guess, MD Debridement Type: Debridement Level of Consciousness (Pre-procedure): Awake and Alert Pre-procedure Verification/Time Out Yes - 10:40 Taken: Start Time: 10:40 Pain Control: Lidocaine 4% T opical Solution Percent of Wound Bed Debrided: 100% T Area Debrided (cm): otal 1.18 Tissue and other material debrided: Non-Viable, Slough, Slough Level: Non-Viable Tissue Debridement Description: Selective/Open Wound Instrument: Curette Bleeding: Minimum Hemostasis Achieved: Pressure Response to Treatment: Procedure was tolerated well Level of Consciousness (Post- Awake and Alert procedure): Post Debridement Measurements of Total Wound Length: (cm) 1.5 Stage: Category/Stage III Width: (cm) 1 Depth: (cm) 0.1 Volume: (cm) 0.118 Character  of Wound/Ulcer Post Debridement: Improved Post Procedure Diagnosis Andrea [name], Andrea [name] ([id number]) [id number] Page 5 of 17 Same as Pre-procedure Notes scribed for Dr. Lady Gary by Samuella Bruin, RN Electronic Signature(s) Signed: 07/21/2022 2:01:12 PM By: Duanne Guess MD FACS Signed: 07/21/2022 3:47:51 PM By: Gelene Mink By: Samuella Bruin on 07/21/2022  10:42:44 -------------------------------------------------------------------------------- Debridement Details Patient Name: Date of Service: Andrea [name]. 07/21/2022 10:00 [name] Medical Record Number: [id number] Patient Account Number: [id number] Date of Birth/Sex: Treating RN: [birth year] (81 y.o. Andrea [name] Primary Care Provider: Jarome Matin Other Clinician: Referring Provider: Treating Provider/Extender: Marena Chancy in Treatment: 34 Debridement Performed for Assessment: Wound #4 Left,Lateral Ankle Performed By: Physician Duanne Guess, MD Debridement Type: Debridement Level of Consciousness (Pre-procedure): Awake and Alert Pre-procedure Verification/Time Out Yes - 10:40 Taken: Start Time: 10:40 Pain Control: Lidocaine 4% T opical Solution Percent of Wound Bed Debrided: 100% T Area Debrided (cm): otal 0.86 Tissue and other material debrided: Non-Viable, Slough, Slough Level: Non-Viable Tissue Debridement Description: Selective/Open Wound Instrument: Curette Bleeding: Minimum Hemostasis Achieved: Pressure Response to Treatment: Procedure was tolerated well Level of Consciousness (Post- Awake and Alert procedure): Post Debridement Measurements of Total Wound Length: (cm) 1.1 Stage: Category/Stage IV Width: (cm) 1 Depth: (cm) 0.3 Volume: (cm) 0.259 Character of Wound/Ulcer Post Debridement: Improved Post Procedure Diagnosis Same as Pre-procedure Notes scribed for Dr. Lady Gary by Samuella Bruin, RN Electronic Signature(s) Signed: 07/21/2022 2:01:12 PM By: Duanne Guess MD FACS Signed: 07/21/2022 3:47:51 PM By: Samuella Bruin Entered By: Samuella Bruin on 07/21/2022 10:43:53 HPI Details -------------------------------------------------------------------------------- Andrea [name] ([id number]) [id number] Page 6 of 17 Patient Name: Date of Service: Andrea [name] 07/21/2022 10:00 [name]  M Medical Record Number: [id number] Patient Account Number: [id number] Date of Birth/Sex: Treating RN: 23-Aug-1940 (82 y.o. 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 She is not diabetic and quit smoking over 40 years ago. 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 She has developed [name] pressure ulcer on the right medial malleolus. In addition, she apparently has had multiple cutaneous abscesses on her buttocks that have required repeated incision and drainage procedures. She has developed [name] large abscess on her right buttock that has become painful. She was recently treated with cephalexin by her PCP for urinary tract infection and also received [name] shot of Rocephin for the abscess, but it has not yet been incised or drained. She is nonambulatory but is able to stand to transfer. She does not wear any sort of Prevalon boot. ABI in clinic today was 0.96. On her right medial malleolus, there is [name] circular ulcer. The surface is dry and fibrotic. There is some periwound erythema, but no malodor or purulent drainage. On her right buttock, there is [name] large fluctuant bulge about the size of an orange. It is also erythematous and tender. 12/04/2021: The culture from her abscess grew out staph. [name] 10-day course of Bactrim was prescribed and she is currently taking this. Unfortunately, Dakin's solution was not delivered and only 2 x 2 gauze was sent, rather than Kerlix so the packing of the wound has been rather suboptimal. The wound cavity has light slough over all of the surfaces. Although covered, bone is  palpable. The medial ankle wound looks about  the same with [name] layer of slough accumulation. In addition, her daughter peeled some dry skin off of her foot this morning and she now has denuded areas over the majority of the plantar surface of her right foot. 01/01/2022: The patient has missed several visits and to her daughters report that it is somewhat difficult to get her here on [name] weekly basis. In the interim, she has developed [name] new pressure ulcer on her left lateral malleolus. The plantar surface of her right foot has healed. The cavity on her right buttock has contracted, but it remains quite deep and the trochanter, while not exposed, is palpable at the base. The pressure ulcer on her right medial malleolus is basically unchanged. 02/09/2022: All wounds are little bit smaller today. As the buttock abscess cavity has contracted, it has left some tunneling that has not been adequately packed. Light slough accumulation on both ankle wounds. 02/26/2022: All of the wounds continue to contract. There is slough on both ankle wounds. The buttock ulcer is clean. She brought her wound VAC with her today. 03/12/2022: The ankle wounds are about the same this week with slough accumulation. The buttock ulcer is smaller and quite clean. She decided that she could not tolerate the discomfort of the wound VAC and removed it. They have been packing the wound with Dakin's moistened gauze. 03/25/2022: The ankle wounds are slightly smaller. Both have slough accumulation. The orifice of the buttock ulcer has gotten quite [name] bit smaller than the underlying cavity. She has decided that she would like to have the wound VAC back. 3/14; patient presents for follow-up. She has bilateral ankle wounds and has been using Hydrofera Blue to the these wound beds. She has Prevalon boots to help with offloading. She also has [name] sacral wound that we have been using [name] wound VAC for. She has no issues or complaints today. 04/29/2022: She once again decided she did not like the wound VAC  and so they have been packing her gluteal wound with Dakin's moistened gauze. The gluteal ulcer seems to be about the same. Her left lateral ankle wound has gotten deeper and larger. The surface tissue is gray with thick slough. No significant change to the right medial ankle wound. 05/18/2022: The depth on the gluteal ulcer has come in [name] bit. There was [name] very strong pungent odor when the packing was removed, however. The right medial ankle wound is about the same size, but the tissue surface looks healthier. The left lateral ankle wound has reaccumulated the rubbery gray slough. 05/31/2022: The gluteal ulcer is shallower and no longer has any dips or crevices. The odor has abated completely. The right medial ankle wound is unchanged. The left lateral ankle wound looks quite [name] bit healthier with [name] cleaner surface and the beginnings of granulation tissue emergence. 06/16/2022: The gluteal ulcer has the same measurements more or less, but overall the volume seems smaller, as the cavity feels tighter on examination. The ankle wounds are basically the same. 07/12/2022: The cavity of the buttock ulcer continues to contract. The left ankle wound has [name] little bit more granulation tissue, but bone remains exposed. The right ankle wound is flush with the surrounding skin. 07/21/2022: There is 1 deeper area in the buttock ulcer that I have not appreciated previously. I can feel bone under [name] layer of tissue. The rest of the cavity has contracted. Both ankle wounds are filling in with better granulation tissue. There  is slough on both ankle wound surfaces. She struck her leg on her bed frame and has 2 new wounds on her right anterior tibial surface. Both have fat layer exposure but no concern for infection. Electronic Signature(s) Signed: 07/21/2022 11:00:31 AM By: Duanne Guess MD FACS Entered By: Duanne Guess on 07/21/2022  11:00:31 -------------------------------------------------------------------------------- Physical Exam Details Patient Name: Date of Service: Andrea [name]. 07/21/2022 10:00 [name] Medical Record Number: [id number] Patient Account Number: [id number] Date of Birth/Sex: Treating RN: [birth year] (82 y.o. F) Primary Care Provider: Jarome Matin Other Clinician: Peri [name] ([id number]) [id number] Page 7 of 17 Referring Provider: Treating Provider/Extender: Marena Chancy in Treatment: 34 Constitutional . . . . no acute distress. Respiratory Normal work of breathing on room air. Notes 07/21/2022: There is 1 deeper area in the buttock ulcer that I have not appreciated previously. I can feel bone under [name] layer of tissue. The rest of the cavity has contracted. Both ankle wounds are filling in with better granulation tissue. There is slough on both ankle wound surfaces. She struck her leg on her bed frame and has 2 new wounds on her right anterior tibial surface. Both have fat layer exposure but no concern for infection. Electronic Signature(s) Signed: 07/21/2022 11:00:54 AM By: Duanne Guess MD FACS Entered By: Duanne Guess on 07/21/2022 11:00:54 -------------------------------------------------------------------------------- Physician Orders Details Patient Name: Date of Service: Andrea [name]. 07/21/2022 10:00 [name] Medical Record Number: [id number] Patient Account Number: [id number] Date of Birth/Sex: Treating RN: 01-24-1941 (82 y.o. 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 - Dr. Lady Gary - Room 1 Return [name] Anesthetic (In clinic) Topical Lidocaine 4% applied to wound bed Cellular or Tissue Based Products Cellular or Tissue Based Product Type: - oasis tri-layer #2 Cellular or Tissue Based Product applied to wound bed, secured with steri-strips, cover with Adaptic or Mepitel. (DO NOT REMOVE). Bathing/ Shower/ Hygiene May shower with protection but do not get wound dressing(s) wet. Protect dressing(s) with water repellant cover (for example, large plastic bag) or [name] cast cover and may then take shower. Off-Loading Multipodus Splint to: - prevalon boot to both feet Turn and reposition every 2 hours Home Health New wound care orders this week; continue Home Health for wound care. May utilize formulary equivalent dressing for wound treatment orders unless otherwise specified. - may chage outer dressing only on ankles, DO NOT remove sterostrips Dressing changes to be completed by Home Health on Monday / Wednesday / Friday except when patient has scheduled visit at [facility]. Other Home Health Orders/Instructions: DESTANE, KAHN [name] ([id number]) [id number] Page 8 of 17 Wound Treatment Wound #1 - Malleolus Wound Laterality: Right, Medial Cleanser: Normal Saline (Generic) 1 x Per Week/30 Days Discharge Instructions: Cleanse the wound with Normal Saline prior to applying [name] clean dressing using gauze sponges, not tissue or cotton balls. Cleanser: Byram Ancillary Kit - 15 Day Supply (  Generic) 1 x Per Week/30 Days Discharge Instructions: Use supplies as instructed;  Kit contains: (15) Saline Bullets; (15) 3x3 Gauze; 15 pr Gloves Andrea-Wound Care: Skin Prep (Generic) 1 x Per Week/30 Days Discharge Instructions: Use skin prep as directed Prim Dressing: Cutimed Sorbact Swab 1 x Per Week/30 Days ary Discharge Instructions: Apply to wound bed as instructed Prim Dressing: Oasis tri-layer ary 1 x Per Week/30 Days Secured With: [employer], 4.5x3.1 (in/yd) 1 x Per Week/30 Days Discharge Instructions: Secure with Kerlix as directed. Wound #2 - Gluteus Wound Laterality: Right Cleanser: Normal Saline 1 x Per Day/30 Days Discharge Instructions: Cleanse the wound with Normal Saline prior to applying [name] clean dressing using gauze sponges, not tissue or cotton balls. Andrea-Wound Care: Skin Prep (Generic) 1 x Per Day/30 Days Discharge Instructions: Use skin prep as directed Prim Dressing: Dakin's Solution 0.25%, 16 (oz) (Generic) 1 x Per Day/30 Days ary Discharge Instructions: Moisten gauze with Dakin's solution Prim Dressing: cotton tipped applicators (Generic) 1 x Per Day/30 Days ary Discharge Instructions: use to pack wound Secondary Dressing: Zetuvit Plus Silicone Border Dressing 5x5 (in/in) (Generic) 1 x Per Day/30 Days Discharge Instructions: Apply silicone border over primary dressing as directed. Secured With: 94M Medipore H Soft Cloth Surgical T ape, 4 x 10 (in/yd) (Generic) 1 x Per Day/30 Days Discharge Instructions: Secure with tape as directed. Wound #4 - Ankle Wound Laterality: Left, Lateral Cleanser: Normal Saline (Generic) 1 x Per Week/30 Days Discharge Instructions: Cleanse the wound with Normal Saline prior to applying [name] clean dressing using gauze sponges, not tissue or cotton balls. Cleanser: Byram Ancillary Kit - 15 Day Supply (Generic) 1 x Per Week/30 Days Discharge Instructions: Use supplies as instructed; Kit contains: (15) Saline Bullets; (15) 3x3 Gauze; 15 pr Gloves Andrea-Wound Care: Skin Prep (Generic) 1 x Per Week/30 Days Discharge  Instructions: Use skin prep as directed Prim Dressing: Cutimed Sorbact Swab 1 x Per Week/30 Days ary Discharge Instructions: Apply to wound bed as instructed Prim Dressing: Oasis tri-layer ary 1 x Per Week/30 Days Secondary Dressing: ABD Pad, 5x9 1 x Per Week/30 Days Discharge Instructions: Apply over primary dressing as directed. Secured With: [employer], 4.5x3.1 (in/yd) 1 x Per Week/30 Days Discharge Instructions: Secure with Kerlix as directed. Wound #5 - Lower Leg Wound Laterality: Right Cleanser: Soap and Water 1 x Per Day/30 Days Discharge Instructions: May shower and wash wound with dial antibacterial soap and water prior to dressing change. Cleanser: Wound Cleanser 1 x Per Day/30 Days Discharge Instructions: Cleanse the wound with wound cleanser prior to applying [name] clean dressing using gauze sponges, not tissue or cotton balls. Prim Dressing: Maxorb Extra Ag+ Alginate Dressing, 2x2 (in/in) 1 x Per Day/30 Days ary Discharge Instructions: Apply to wound bed as instructed Secondary Dressing: Zetuvit Plus Silicone Border Dressing 4x4 (in/in) 1 x Per Day/30 Days Discharge Instructions: Apply silicone border over primary dressing as directed. Wound #6 - Lower Leg Wound Laterality: Right, Anterior, Distal Cleanser: Soap and Water 1 x Per Day/30 Days Discharge Instructions: May shower and wash wound with dial antibacterial soap and water prior to dressing change. Andrea [name], Andrea [name] ([id number]) [id number] Page 9 of 17 Cleanser: Wound Cleanser 1 x Per Day/30 Days Discharge Instructions: Cleanse the wound with wound cleanser prior to applying [name] clean dressing using gauze sponges, not tissue or cotton balls. Prim Dressing: Maxorb Extra Ag+ Alginate Dressing, 2x2 (in/in) 1 x Per Day/30 Days ary Discharge Instructions: Apply to wound bed as instructed Secondary Dressing: Zetuvit  Plus Silicone Border Dressing 4x4 (in/in) 1 x Per Day/30 Days Discharge  Instructions: Apply silicone border over primary dressing as directed. Patient Medications llergies: penicillin, latex, Prolia, ceftriaxone, ciprofloxacin, Fosamax, lisinopril, tuberculin,PPD,multi-puncture [name] Notifications Medication Indication Start End 07/21/2022 lidocaine DOSE topical 4 % cream - cream topical Electronic Signature(s) Signed: 07/21/2022 2:01:12 PM By: Duanne Guess MD FACS Entered By: Duanne Guess on 07/21/2022 11:02:02 -------------------------------------------------------------------------------- Problem List Details Patient Name: Date of Service: Andrea [name]. 07/21/2022 10:00 [name] Medical Record Number: [id number] Patient Account Number: [id number] Date of Birth/Sex: Treating RN: 12-Dec-1940 (82 y.o. 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 01/01/2022 No Yes evidence of necrosis L97.812 Non-pressure chronic ulcer of other part of right lower leg with fat layer 07/21/2022 No Yes exposed L02.31 Cutaneous abscess of buttock 11/24/2021 No Yes E27.40 Unspecified adrenocortical insufficiency 11/24/2021 No Yes Z79.52 Long term (current) use of systemic steroids 11/24/2021 No Yes Andrea [name], Andrea [name] ([id number]) [id number] Page 10 of [phone] Essential (primary) hypertension 11/24/2021 No Yes Inactive Problems Resolved Problems ICD-10 Code Description Active Date Resolved Date L97.511 Non-pressure chronic ulcer of other part of right foot limited to breakdown of skin 12/04/2021 12/04/2021 Electronic Signature(s) Signed: 07/21/2022  10:58:41 AM By: Duanne Guess MD FACS Entered By: Duanne Guess on 07/21/2022 10:58:41 -------------------------------------------------------------------------------- Progress Note Details Patient Name: Date of Service: Andrea [name]. 07/21/2022 10:00 [name] Medical Record Number: [id number] Patient Account Number: [id number] Date of Birth/Sex: Treating RN: 07-05-1940 (82 y.o. 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 She is not diabetic and quit smoking over 40 years ago. 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 She has developed [name] pressure ulcer on the right medial malleolus. In addition, she apparently has had multiple cutaneous abscesses on her buttocks that have required repeated incision and drainage procedures. She has developed [name] large abscess on her right buttock that has become painful. She was recently treated with cephalexin by her PCP for urinary tract infection and also received [name] shot of Rocephin for the abscess, but it has not yet been incised or drained. She is nonambulatory but is able to stand to transfer. She does not wear any sort of Prevalon boot. ABI in clinic today was 0.96. On her right medial malleolus, there is [name] circular ulcer. The surface is dry and fibrotic. There is some periwound erythema, but no malodor or purulent drainage. On her right buttock, there is [name] large fluctuant bulge about the size of an orange. It is  also erythematous and tender. 12/04/2021: The culture from her abscess grew out staph. [name] 10-day course of Bactrim was prescribed and she is currently taking this. Unfortunately, Dakin's solution was not delivered and only 2 x  2 gauze was sent, rather than Kerlix so the packing of the wound has been rather suboptimal. The wound cavity has light slough over all of the surfaces. Although covered, bone is palpable. The medial ankle wound looks about the same with [name] layer of slough accumulation. In addition, her daughter peeled some dry skin off of her foot this morning and she now has denuded areas over the majority of the plantar surface of her right foot. 01/01/2022: The patient has missed several visits and to her daughters report that it is somewhat difficult to get her here on [name] weekly basis. In the interim, she has developed [name] new pressure ulcer on her left lateral malleolus. The plantar surface of her right foot has healed. The cavity on her right buttock has contracted, but it remains quite deep and the trochanter, while not exposed, is palpable at the base. The pressure ulcer on her right medial malleolus is basically unchanged. 02/09/2022: All wounds are little bit smaller today. As the buttock abscess cavity has contracted, it has left some tunneling that has not been adequately packed. Light slough accumulation on both ankle wounds. 02/26/2022: All of the wounds continue to contract. There is slough on both ankle wounds. The buttock ulcer is clean. She brought her wound VAC with her today. 03/12/2022: The ankle wounds are about the same this week with slough accumulation. The buttock ulcer is smaller and quite clean. She decided that she could not tolerate the discomfort of the wound VAC and removed it. They have been packing the wound with Dakin's moistened gauze. 03/25/2022: The ankle wounds are slightly smaller. Both have slough accumulation. The orifice of the buttock ulcer has gotten quite [name]  bit smaller than the underlying cavity. She has decided that she would like to have the wound VAC back. 3/14; patient presents for follow-up. She has bilateral ankle wounds and has been using Hydrofera Blue to the these wound beds. She has Prevalon boots to help with offloading. She also has [name] sacral wound that we have been using [name] wound VAC for. She has no issues or complaints today. Andrea [name], Andrea [name] ([id number]) [id number] Page 11 of 17 04/29/2022: She once again decided she did not like the wound VAC and so they have been packing her gluteal wound with Dakin's moistened gauze. The gluteal ulcer seems to be about the same. Her left lateral ankle wound has gotten deeper and larger. The surface tissue is gray with thick slough. No significant change to the right medial ankle wound. 05/18/2022: The depth on the gluteal ulcer has come in [name] bit. There was [name] very strong pungent odor when the packing was removed, however. The right medial ankle wound is about the same size, but the tissue surface looks healthier. The left lateral ankle wound has reaccumulated the rubbery gray slough. 05/31/2022: The gluteal ulcer is shallower and no longer has any dips or crevices. The odor has abated completely. The right medial ankle wound is unchanged. The left lateral ankle wound looks quite [name] bit healthier with [name] cleaner surface and the beginnings of granulation tissue emergence. 06/16/2022: The gluteal ulcer has the same measurements more or less, but overall the volume seems smaller, as the cavity feels tighter on examination. The ankle wounds are basically the same. 07/12/2022: The cavity of the buttock ulcer continues to contract. The left ankle wound has [name] little bit more granulation tissue, but bone remains exposed. The right ankle wound is flush with the surrounding skin. 07/21/2022: There is  1 deeper area in the buttock ulcer that I have not appreciated previously. I can feel bone under [name]  layer of tissue. The rest of the cavity has contracted. Both ankle wounds are filling in with better granulation tissue. There is slough on both ankle wound surfaces. She struck her leg on her bed frame and has 2 new wounds on her right anterior tibial surface. Both have fat layer exposure but no concern for infection. Patient History Information obtained from Patient, Caregiver, Chart. Family History Cancer - Father,Maternal Grandparents,Siblings,Child, Heart Disease - Father, Hypertension - Father, Thyroid Problems - Mother, No family history of Diabetes, Hereditary Spherocytosis, Kidney Disease, Lung Disease, Seizures, Stroke, Tuberculosis. Social History Former smoker - quit 40 yr ago, Marital Status - Widowed, Alcohol Use - Never, Drug Use - No History, Caffeine Use - Daily - tea, soda. Medical History Eyes Patient has history of Cataracts - bil extracted Denies history of Glaucoma, Optic Neuritis Cardiovascular Patient has history of Arrhythmia - afib, Hypertension Endocrine Denies history of Type I Diabetes, Type II Diabetes Genitourinary Denies history of End Stage Renal Disease Integumentary (Skin) Denies history of History of Burn Musculoskeletal Patient has history of Osteoarthritis, Osteomyelitis - pelvic Neurologic Patient has history of Neuropathy Oncologic Denies history of Received Chemotherapy, Received Radiation Psychiatric Patient has history of Confinement Anxiety Denies history of Anorexia/bulimia Hospitalization/Surgery History - TEE. - right ankle fusion. - bil knee replacements. - bil cataract extractions. - posterior lumbar fusion. - vaginal hysterectomy. - cholecystectomy. 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 Vitals Time Taken: 10:12 AM, Height: 59 in, Weight: 155 lbs, BMI: 31.3, Temperature: 98 F, Pulse: 87 bpm, Respiratory Rate: 16 breaths/min, Blood Pressure: 134/72 mmHg. Respiratory Normal work of breathing on room air. SYLVIANA, SIRMON [name] ([id number]) [id number] Page 12 of 17 General Notes: 07/21/2022: There is 1 deeper area in the buttock ulcer that I have not appreciated previously. I can feel bone under [name] layer of tissue. The rest of the cavity has contracted. Both ankle wounds are filling in with better granulation tissue. There is slough on both ankle wound surfaces. She struck her leg on her bed frame and has 2 new wounds on her right anterior tibial surface. Both have fat layer exposure but no concern for infection. Integumentary (Hair, Skin) Wound #1 status is Open. Original cause of wound was Pressure Injury. The date acquired was: 11/16/2021. The wound has been in treatment 34 weeks. The wound is located on the Right,Medial Malleolus. The wound measures 1.5cm length x 1cm width x 0.1cm depth; 1.178cm^2 area and 0.118cm^3 volume. There is Fat Layer (Subcutaneous Tissue) exposed. There is no tunneling or undermining noted. There is [name] medium amount of serosanguineous drainage noted. The wound margin is distinct with the outline attached to the wound base. There is medium (34-66%) red, pink granulation within the wound bed. There is [name] medium (34-66%) amount of necrotic tissue within the wound bed including Adherent Slough. The periwound skin appearance had no abnormalities noted for texture. The periwound skin appearance had no abnormalities noted for moisture. The periwound skin appearance exhibited: Erythema. The surrounding wound skin color is noted with erythema. Erythema is measured at 2.5 cm. Periwound temperature was noted as No Abnormality. Wound #2 status is Open. Original cause of wound was  Bump. The date acquired was:  09/28/2021. The wound has been in treatment 34 weeks. The wound is located on the Right Gluteus. The wound measures 1cm length x 2cm width x 3.5cm depth; 1.571cm^2 area and 5.498cm^3 volume. There is Fat Layer (Subcutaneous Tissue) exposed. There is no tunneling or undermining noted. There is [name] medium amount of serous drainage noted. The wound margin is well defined and not attached to the wound base. There is large (67-100%) pink, pale granulation within the wound bed. There is [name] small (1-33%) amount of necrotic tissue within the wound bed including Adherent Slough. The periwound skin appearance had no abnormalities noted for texture. The periwound skin appearance had no abnormalities noted for moisture. The periwound skin appearance had no abnormalities noted for color. Periwound temperature was noted as No Abnormality. Wound #4 status is Open. Original cause of wound was Pressure Injury. The date acquired was: 12/23/2021. The wound has been in treatment 28 weeks. The wound is located on the Left,Lateral Ankle. The wound measures 1.1cm length x 1cm width x 0.3cm depth; 0.864cm^2 area and 0.259cm^3 volume. There is bone, tendon, and Fat Layer (Subcutaneous Tissue) exposed. There is no tunneling or undermining noted. There is [name] medium amount of serosanguineous drainage noted. The wound margin is distinct with the outline attached to the wound base. There is no granulation within the wound bed. There is [name] large (67- 100%) amount of necrotic tissue within the wound bed including Adherent Slough. The periwound skin appearance had no abnormalities noted for texture. The periwound skin appearance had no abnormalities noted for moisture. The periwound skin appearance exhibited: Rubor, Erythema. The surrounding wound skin color is noted with erythema which is circumferential. Erythema is measured at 1.4 cm. Periwound temperature was noted as No Abnormality. The periwound has  tenderness on palpation. Wound #5 status is Open. Original cause of wound was Skin T ear/Laceration. The date acquired was: 07/16/2022. The wound is located on the Right Lower Leg. The wound measures 1cm length x 1.7cm width x 0.1cm depth; 1.335cm^2 area and 0.134cm^3 volume. There is Fat Layer (Subcutaneous Tissue) exposed. There is no tunneling or undermining noted. There is [name] medium amount of serous drainage noted. The wound margin is distinct with the outline attached to the wound base. There is large (67-100%) red granulation within the wound bed. There is [name] small (1-33%) amount of necrotic tissue within the wound bed including Adherent Slough. The periwound skin appearance had no abnormalities noted for texture. The periwound skin appearance had no abnormalities noted for moisture. The periwound skin appearance had no abnormalities noted for color. Periwound temperature was noted as No Abnormality. Wound #6 status is Open. Original cause of wound was Skin T ear/Laceration. The date acquired was: 07/16/2022. The wound is located on the Right,Distal,Anterior Lower Leg. The wound measures 1.4cm length x 1.1cm width x 0.1cm depth; 1.21cm^2 area and 0.121cm^3 volume. There is Fat Layer (Subcutaneous Tissue) exposed. There is no tunneling or undermining noted. There is [name] medium amount of serous drainage noted. The wound margin is distinct with the outline attached to the wound base. There is small (1-33%) red granulation within the wound bed. There is [name] large (67-100%) amount of necrotic tissue within the wound bed including Adherent Slough. The periwound skin appearance had no abnormalities noted for texture. The periwound skin appearance had no abnormalities noted for moisture. The periwound skin appearance had no abnormalities noted for color. Periwound temperature was noted as No Abnormality. 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 There was [name] Selective/Open Wound Non-Viable Tissue Debridement with [name] total area of 1.18 sq cm performed by Duanne Guess, MD. With the following instrument(s): Curette to remove Non-Viable tissue/material. Material removed includes [facility] after achieving pain control using Lidocaine 4% Topical Solution. No specimens were taken. [name] time out was conducted at 10:40, prior to the start of the procedure. [name] Minimum amount of bleeding was controlled with Pressure. The procedure was tolerated well. Post Debridement Measurements: 1.5cm length x 1cm width x 0.1cm depth; 0.118cm^3 volume. Post debridement Stage noted as Category/Stage III. Character of Wound/Ulcer Post Debridement is improved. Post procedure Diagnosis Wound #1: Same as Pre-Procedure General Notes: scribed for Dr. Lady Gary by Samuella Bruin, RN. Pre-procedure diagnosis of Wound #1 is [name] Pressure Ulcer located on the Right,Medial Malleolus. [name] skin graft procedure using [name] bioengineered skin substitute/cellular or tissue based product was performed by Duanne Guess, MD with the following instrument(s): Forceps and Scissors. Oasis tri-layer was applied and secured with Steri-Strips. 4 sq cm of product was utilized and 1 sq cm was wasted due to wound size. Post Application, sorbact was applied. [name] Time Out was conducted at 10:47, prior to the start of the procedure. The procedure was tolerated well with [name] pain level of 0 throughout and [name] pain level of 0 following the procedure. Post procedure Diagnosis  Wound #1: Same as Pre-Procedure General Notes: scribed for Dr. Lady Gary by Samuella Bruin, RN. 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 There was [name] Selective/Open Wound Non-Viable Tissue Andrea [name], Andrea [name] ([id number]) [id number] Page 13 of 17 Debridement with [name] total area of 0.86 sq cm performed by Duanne Guess, MD. With the following instrument(s): Curette to remove Non-Viable tissue/material. Material removed includes [facility] after achieving pain control using Lidocaine 4% Topical Solution. No specimens were taken. [name] time out was conducted at 10:40, prior to the start of the procedure. [name] Minimum amount of bleeding was controlled with Pressure. The procedure was tolerated well. Post Debridement Measurements: 1.1cm length x 1cm width x 0.3cm depth; 0.259cm^3 volume. Post debridement Stage noted as Category/Stage IV. Character of Wound/Ulcer Post Debridement is improved. Post procedure Diagnosis Wound #4: Same as Pre-Procedure General Notes: scribed for Dr. Lady Gary by Samuella Bruin, RN. Pre-procedure diagnosis of Wound #4 is [name] Pressure Ulcer located on the Left,Lateral Ankle. [name] skin graft procedure using [name] bioengineered skin substitute/cellular or tissue based product was performed by Duanne Guess, MD with the following instrument(s): Forceps and Scissors. Oasis tri-layer was applied and secured with Steri-Strips. 4 sq cm of product was utilized and 1 sq cm was wasted due to wound size. Post Application, sorbact was applied. [name] Time Out was conducted at 10:47, prior to the start of the procedure. The procedure was tolerated well with [name] pain level of 0 throughout and [name] pain level of 0 following the procedure. Post procedure Diagnosis Wound #4: Same as Pre-Procedure General Notes: scribed for Dr. Lady Gary by Samuella Bruin, RN. Wound #5 Pre-procedure diagnosis of Wound #5 is [name] Skin T located on the Right  Lower Leg .  There was [name] Excisional Skin/Subcutaneous Tissue Debridement with [name] ear total area of 1.33 sq cm performed by Duanne Guess, MD. With the following instrument(s): Curette to remove Non-Viable tissue/material. Material removed includes Subcutaneous Tissue and Slough and after achieving pain control using Lidocaine 4% T opical Solution. No specimens were taken. [name] time out was conducted at 10:40, prior to the start of the procedure. [name] Minimum amount of bleeding was controlled with Pressure. The procedure was tolerated well. Post Debridement Measurements: 1cm length x 1.7cm width x 0.1cm depth; 0.134cm^3 volume. Character of Wound/Ulcer Post Debridement is improved. Post procedure Diagnosis Wound #5: Same as Pre-Procedure General Notes: scribed for Dr. Lady Gary by Samuella Bruin, RN. Wound #6 Pre-procedure diagnosis of Wound #6 is [name] Skin T located on the Right,Distal,Anterior Lower Leg . There was [name] Selective/Open Wound Non-Viable Tissue ear Debridement with [name] total area of 1.21 sq cm performed by Duanne Guess, MD. With the following instrument(s): Curette to remove Non-Viable tissue/material. Material removed includes [facility] after achieving pain control using Lidocaine 4% Topical Solution. No specimens were taken. [name] time out was conducted at 10:40, prior to the start of the procedure. [name] Minimum amount of bleeding was controlled with Pressure. The procedure was tolerated well. Post Debridement Measurements: 1.4cm length x 1.1cm width x 0.1cm depth; 0.121cm^3 volume. Character of Wound/Ulcer Post Debridement is improved. Post procedure Diagnosis Wound #6: Same as Pre-Procedure General Notes: scribed for Dr. Lady Gary by Samuella Bruin, RN. Plan Follow-up Appointments: Return Appointment in 1 week. - Dr. Lady Gary - Room 1 Anesthetic: (In clinic) Topical Lidocaine 4% applied to wound bed Cellular or Tissue Based Products: Cellular or Tissue Based Product Type: - oasis tri-layer  #2 Cellular or Tissue Based Product applied to wound bed, secured with steri-strips, cover with Adaptic or Mepitel. (DO NOT REMOVE). Bathing/ Shower/ Hygiene: May shower with protection but do not get wound dressing(s) wet. Protect dressing(s) with water repellant cover (for example, large plastic bag) or [name] cast cover and may then take shower. Off-Loading: Multipodus Splint to: - prevalon boot to both feet Turn and reposition every 2 hours Home Health: New wound care orders this week; continue Home Health for wound care. May utilize formulary equivalent dressing for wound treatment orders unless otherwise specified. - may chage outer dressing only on ankles, DO NOT remove sterostrips Dressing changes to be completed by Home Health on Monday / Wednesday / Friday except when patient has scheduled visit at [facility]. Other Home Health Orders/Instructions: - [facility] The following medication(s) was prescribed: lidocaine topical 4 % cream cream topical was prescribed at facility WOUND #1: - Malleolus Wound Laterality: Right, Medial Cleanser: Normal Saline (Generic) 1 x Per Week/30 Days Discharge Instructions: Cleanse the wound with Normal Saline prior to applying [name] clean dressing using gauze sponges, not tissue or cotton balls. Cleanser: [employer] Ancillary Kit - 15 Day Supply (Generic) 1 x Per Week/30 Days Discharge Instructions: Use supplies as instructed; Kit contains: (15) Saline Bullets; (15) 3x3 Gauze; 15 pr Gloves Andrea-Wound Care: Skin Prep (Generic) 1 x Per Week/30 Days Discharge Instructions: Use skin prep as directed Prim Dressing: Cutimed Sorbact Swab 1 x Per Week/30 Days ary Discharge Instructions: Apply to wound bed as instructed Prim Dressing: Oasis tri-layer 1 x Per Week/30 Days ary Secured With: [employer], 4.5x3.1 (in/yd) 1 x Per Week/30 Days Discharge Instructions: Secure with Kerlix as directed. WOUND #2: - Gluteus Wound Laterality: Right Cleanser: Normal  Saline 1 x Per Day/30 Days Discharge Instructions: Cleanse the wound  with Normal Saline prior to applying [name] clean dressing using gauze sponges, not tissue or cotton balls. Andrea-Wound Care: Skin Prep (Generic) 1 x Per Day/30 Days Discharge Instructions: Use skin prep as directed Prim Dressing: Dakin's Solution 0.25%, 16 (oz) (Generic) 1 x Per Day/30 Days ary Discharge Instructions: Moisten gauze with Dakin's solution Prim Dressing: cotton tipped applicators (Generic) 1 x Per Day/30 Days ary Discharge Instructions: use to pack wound Secondary Dressing: Zetuvit Plus Silicone Border Dressing 5x5 (in/in) (Generic) 1 x Per Day/30 Days Discharge Instructions: Apply silicone border over primary dressing as directed. Secured With: 22M Medipore H Soft Cloth Surgical T ape, 4 x 10 (in/yd) (Generic) 1 x Per Day/30 Days Discharge Instructions: Secure with tape as directed. WOUND #4: - Ankle Wound Laterality: Left, Lateral Cleanser: Normal Saline (Generic) 1 x Per Week/30 Days Discharge Instructions: Cleanse the wound with Normal Saline prior to applying [name] clean dressing using gauze sponges, not tissue or cotton balls. Cleanser: Byram Ancillary Kit - 15 Day Supply (Generic) 1 x Per Week/30 Days GIULIA, BERMINGHAM [name] ([id number]) [id number] Page 14 of 17 Discharge Instructions: Use supplies as instructed; Kit contains: (15) Saline Bullets; (15) 3x3 Gauze; 15 pr Gloves Andrea-Wound Care: Skin Prep (Generic) 1 x Per Week/30 Days Discharge Instructions: Use skin prep as directed Prim Dressing: Cutimed Sorbact Swab 1 x Per Week/30 Days ary Discharge Instructions: Apply to wound bed as instructed Prim Dressing: Oasis tri-layer 1 x Per Week/30 Days ary Secondary Dressing: ABD Pad, 5x9 1 x Per Week/30 Days Discharge Instructions: Apply over primary dressing as directed. Secured With: [employer], 4.5x3.1 (in/yd) 1 x Per Week/30 Days Discharge Instructions: Secure with Kerlix as  directed. WOUND #5: - Lower Leg Wound Laterality: Right Cleanser: Soap and Water 1 x Per Day/30 Days Discharge Instructions: May shower and wash wound with dial antibacterial soap and water prior to dressing change. Cleanser: Wound Cleanser 1 x Per Day/30 Days Discharge Instructions: Cleanse the wound with wound cleanser prior to applying [name] clean dressing using gauze sponges, not tissue or cotton balls. Prim Dressing: Maxorb Extra Ag+ Alginate Dressing, 2x2 (in/in) 1 x Per Day/30 Days ary Discharge Instructions: Apply to wound bed as instructed Secondary Dressing: Zetuvit Plus Silicone Border Dressing 4x4 (in/in) 1 x Per Day/30 Days Discharge Instructions: Apply silicone border over primary dressing as directed. WOUND #6: - Lower Leg Wound Laterality: Right, Anterior, Distal Cleanser: Soap and Water 1 x Per Day/30 Days Discharge Instructions: May shower and wash wound with dial antibacterial soap and water prior to dressing change. Cleanser: Wound Cleanser 1 x Per Day/30 Days Discharge Instructions: Cleanse the wound with wound cleanser prior to applying [name] clean dressing using gauze sponges, not tissue or cotton balls. Prim Dressing: Maxorb Extra Ag+ Alginate Dressing, 2x2 (in/in) 1 x Per Day/30 Days ary Discharge Instructions: Apply to wound bed as instructed Secondary Dressing: Zetuvit Plus Silicone Border Dressing 4x4 (in/in) 1 x Per Day/30 Days Discharge Instructions: Apply silicone border over primary dressing as directed. 07/21/2022: There is 1 deeper area in the buttock ulcer that I have not appreciated previously. I can feel bone under [name] layer of tissue. The rest of the cavity has contracted. Both ankle wounds are filling in with better granulation tissue. There is slough on both ankle wound surfaces. She struck her leg on her bed frame and has 2 new wounds on her right anterior tibial surface. Both have fat layer exposure but no concern for infection. I used [name] curette to debride  slough off  of the new anterior tibial wounds, as well as both ankle wounds. We will apply silver alginate to the anterior tibial wounds. The buttock wound did not require debridement. Due to the proximity of bone to the surface, I am going to put some collagen over that and continue to pack the wound with Dakin's moistened gauze. Oasis Tri layer #2 was rehydrated and applied to each of the ankle wounds in standard fashion. On the left, [name] piece of drawtex was used as [name] bolster; [name] bolster was not required on the right. Due to some periwound skin irritation that looks like it may have been from the Adaptic, I secured it with Sorbact and Steri-Strips. I also applied some zinc oxide to the periwound to try and protect the irritated skin. Follow-up in 1 week. Electronic Signature(s) Signed: 07/21/2022 11:21:13 AM By: Duanne Guess MD FACS Previous Signature: 07/21/2022 11:19:53 AM Version By: Duanne Guess MD FACS Previous Signature: 07/21/2022 11:02:54 AM Version By: Duanne Guess MD FACS Entered By: Duanne Guess on 07/21/2022 11:21:13 -------------------------------------------------------------------------------- HxROS Details Patient Name: Date of Service: [name] [name] 07/21/2022 10:00 [name] Medical Record Number: [id number] Patient Account Number: [id number] Date of Birth/Sex: Treating RN: 16-Feb-1940 (82 y.o. F) Primary Care Provider: Jarome Matin Other Clinician: Referring Provider: Treating Provider/Extender: Marena Chancy in Treatment: 34 Information Obtained From Patient Caregiver Chart Constitutional Symptoms (General Health) Medical History: Past Medical History Notes: morbid obesity Eyes Medical History: Positive for: Cataracts - bil extracted Negative for: Glaucoma; Optic Neuritis CELISA, ORTLOFF [name] ([id number]) [phone].pdf Page 15 of 17 Ear/Nose/Mouth/Throat Medical History: Past Medical History Notes: hard  of hearing Cardiovascular Medical History: Positive for: Arrhythmia - afib; Hypertension Gastrointestinal Medical History: Past Medical History Notes: GERD Endocrine Medical History: Negative for: Type I Diabetes; Type II Diabetes Past Medical History Notes: adrenal insufficiency Genitourinary Medical History: Negative for: End Stage Renal Disease Integumentary (Skin) Medical History: Negative for: History of Burn Musculoskeletal Medical History: Positive for: Osteoarthritis; Osteomyelitis - pelvic Past Medical History Notes: psoriatic arthritis, thoracic discitis, displaced spiral fx right tibia Neurologic Medical History: Positive for: Neuropathy Past Medical History Notes: hx [facility] Oncologic Medical History: Negative for: Received Chemotherapy; Received Radiation Psychiatric Medical History: Positive for: Confinement Anxiety Negative for: Anorexia/bulimia HBO Extended History Items Eyes: Cataracts Immunizations Pneumococcal Vaccine: Received Pneumococcal Vaccination: Yes Received Pneumococcal Vaccination On or After 60th Birthday: Yes Implantable Devices None Hospitalization / Surgery History Type of Hospitalization/Surgery TEE right ankle fusion bil knee replacements RICHA, CADIENTE [name] ([id number]) [id number] Page 16 of 17 bil cataract extractions posterior lumbar fusion vaginal hysterectomy cholecystectomy Family and Social History Cancer: Yes - Father,Maternal Grandparents,Siblings,Child; Diabetes: No; Heart Disease: Yes - Father; Hereditary Spherocytosis: No; Hypertension: Yes - Father; Kidney Disease: No; Lung Disease: No; Seizures: No; Stroke: No; Thyroid Problems: Yes - Mother; Tuberculosis: No; Former smoker - quit 40 yr ago; Marital Status - Widowed; Alcohol Use: Never; Drug Use: No History; Caffeine Use: Daily - tea, soda; Financial Concerns: No; Food, Clothing or Shelter Needs: No; Support System Lacking: No;  Transportation Concerns: No Psychologist, prison and probation services) Signed: 07/21/2022 2:01:12 PM By: Duanne Guess MD FACS Entered By: Duanne Guess on 07/21/2022 11:00:37 -------------------------------------------------------------------------------- SuperBill Details Patient Name: Date of Service: Andrea [name] 07/21/2022 Medical Record Number: [id number] Patient Account Number: [id number] Date of Birth/Sex: Treating RN: 01-21-1941 (81 y.o. 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 L02.31 Cutaneous abscess of buttock E27.40 Unspecified adrenocortical insufficiency Z79.52 Long term (current) use of systemic steroids I10 Essential (primary) hypertension Facility Procedures : CPT4 Code: 16109604 Description: 15271 - SKIN SUB GRAFT TRNK/ARM/LEG ICD-10 Diagnosis Description 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, stage 3 Modifier: Quantity: 1 : CPT4 Code: 54098119 Description: 15272 - SKIN SUB GRAFT T/[name]/L ADD-ON ICD-10 Diagnosis Description L89.523 Pressure ulcer of left ankle, stage 3 Modifier: Quantity: 1 : CPT4 Code: 14782956 Description: 97597 - DEBRIDE WOUND 1ST 20 SQ CM OR < ICD-10 Diagnosis Description L97.812 Non-pressure chronic ulcer of other part of right lower leg with fat layer expos Modifier: ed Quantity: 1 : CPT4 Code: 21308657 Description: Q4124 Oasis Ultra per sq CM ICD-10 Diagnosis Description 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, stage 3 L89.523 Pressure ulcer of left ankle, stage 3 Modifier: Quantity: 11 Physician  Procedures : CPT4 Code Description Modifier [id number] 99214 - WC PHYS LEVEL 4 - EST PT 25 ICD-10 Diagnosis Description ARLEAN, DIGHTON [name] ([id number]) [id number] 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, stage 3 L89.523 Pressure ulcer of left ankle,  stage 3 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 Quantity: 1 27.pdf Page 17 of 17 : [id number] 15271 - WC PHYS SKIN SUB GRAFT TRNK/ARM/LEG 1 ICD-10 Diagnosis Description 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, stage 3 Quantity: : [id number] 15272 - WC PHYS SKIN SUB GRAFT T/[name]/L ADD-ON 1 ICD-10 Diagnosis Description L89.523 Pressure ulcer of left ankle, stage 3 Quantity: : [id number] 97597 - WC PHYS DEBR WO ANESTH 20 SQ CM 1 ICD-10 Diagnosis Description L97.812 Non-pressure chronic ulcer of other part of right lower leg with fat layer exposed Quantity: Electronic Signature(s) Signed: 07/26/2022 1:02:45 PM By: Pearletha Alfred Signed: 07/26/2022 1:40:35 PM By: Duanne Guess MD FACS Previous Signature: 07/21/2022 2:01:12 PM Version By: Duanne Guess MD FACS Previous Signature: 07/21/2022 3:47:51 PM Version By: Samuella Bruin Previous Signature: 07/21/2022 11:22:14 AM Version By: Duanne Guess MD FACS Previous Signature: 07/21/2022 11:21:52 AM Version By: Duanne Guess MD FACS Entered By: Pearletha Alfred on 07/26/2022 13:02:45

## 2022-08-02 ENCOUNTER — Encounter (HOSPITAL_BASED_OUTPATIENT_CLINIC_OR_DEPARTMENT_OTHER): Payer: Medicare Other | Attending: General Surgery | Admitting: General Surgery

## 2022-08-02 DIAGNOSIS — Z6831 Body mass index (BMI) 31.0-31.9, adult: Secondary | ICD-10-CM | POA: Insufficient documentation

## 2022-08-02 DIAGNOSIS — L97812 Non-pressure chronic ulcer of other part of right lower leg with fat layer exposed: Secondary | ICD-10-CM | POA: Diagnosis not present

## 2022-08-02 DIAGNOSIS — I4891 Unspecified atrial fibrillation: Secondary | ICD-10-CM | POA: Insufficient documentation

## 2022-08-02 DIAGNOSIS — E2740 Unspecified adrenocortical insufficiency: Secondary | ICD-10-CM | POA: Insufficient documentation

## 2022-08-02 DIAGNOSIS — L89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, stage 3: Secondary | ICD-10-CM | POA: Diagnosis present

## 2022-08-02 DIAGNOSIS — K219 Gastro-esophageal reflux disease without esophagitis: Secondary | ICD-10-CM | POA: Diagnosis not present

## 2022-08-02 DIAGNOSIS — Z7952 Long term (current) use of systemic steroids: Secondary | ICD-10-CM | POA: Diagnosis not present

## 2022-08-02 DIAGNOSIS — L0231 Cutaneous abscess of buttock: Secondary | ICD-10-CM | POA: Diagnosis not present

## 2022-08-02 DIAGNOSIS — G629 Polyneuropathy, unspecified: Secondary | ICD-10-CM | POA: Insufficient documentation

## 2022-08-02 DIAGNOSIS — Z87891 Personal history of nicotine dependence: Secondary | ICD-10-CM | POA: Diagnosis not present

## 2022-08-02 DIAGNOSIS — Z981 Arthrodesis status: Secondary | ICD-10-CM | POA: Diagnosis not present

## 2022-08-02 DIAGNOSIS — L98415 Non-pressure chronic ulcer of buttock with muscle involvement without evidence of necrosis: Secondary | ICD-10-CM | POA: Diagnosis not present

## 2022-08-02 DIAGNOSIS — L89523 Pressure ulcer of left ankle, stage 3: Secondary | ICD-10-CM | POA: Insufficient documentation

## 2022-08-02 DIAGNOSIS — Z09 Encounter for follow-up examination after completed treatment for conditions other than malignant neoplasm: Secondary | ICD-10-CM | POA: Diagnosis not present

## 2022-08-02 DIAGNOSIS — Z96653 Presence of artificial knee joint, bilateral: Secondary | ICD-10-CM | POA: Insufficient documentation

## 2022-08-02 DIAGNOSIS — M1990 Unspecified osteoarthritis, unspecified site: Secondary | ICD-10-CM | POA: Diagnosis not present

## 2022-08-02 DIAGNOSIS — I10 Essential (primary) hypertension: Secondary | ICD-10-CM | POA: Insufficient documentation

## 2022-08-02 DIAGNOSIS — Z9049 Acquired absence of other specified parts of digestive tract: Secondary | ICD-10-CM | POA: Insufficient documentation

## 2022-08-03 NOTE — Progress Notes (Addendum)
Andrea [name] [name] [name] ([id number]) [id number] Page 1 of 16 Visit Report for 08/02/2022 Chief Complaint Document Details Patient Name: Date of Service: Andrea [name] [name] 08/02/2022 1:15 PM Medical Record Number: [id number] Patient Account Number: [id number] Date of Birth/Sex: Treating RN: [birth year] (82 y.o. F) Primary Care Provider: Jarome Matin Other Clinician: Referring Provider: Treating Provider/Extender: Marena Chancy in Treatment: 35 Information Obtained from: Patient Chief Complaint Patient is at the clinic for treatment of an open pressure ulcer on her right medial ankle, and [name] large abscess on her right buttock Electronic Signature(s) Signed: 08/02/2022 2:22:54 PM By: Duanne Guess MD FACS Entered By: Duanne Guess on 08/02/2022 14:22:54 -------------------------------------------------------------------------------- Cellular or Tissue Based Product Details Patient Name: Date of Service: Andrea [name] [name] 08/02/2022 1:15 PM Medical Record Number: [id number] Patient Account Number: [id number] Date of Birth/Sex: Treating RN: 03/29/1940 (81 y.o. [name] Primary Care Provider: Jarome Matin Other Clinician: Referring Provider: Treating Provider/Extender: Marena Chancy in Treatment: 35 Cellular or Tissue Based Product Type Wound #4 Left,Lateral Ankle Applied to: Performed By: Physician Duanne Guess, MD Cellular or Tissue Based Product Type: Oasis tri-layer Level of Consciousness (Pre-procedure): Awake and Alert Pre-procedure Verification/Time Out Yes - 14:05 Taken: Location: trunk / arms / legs Wound Size (sq cm): 0.63 Product Size (sq cm): 5 Waste Size (sq cm): 2.5 Waste Reason: wound size Amount of Product Applied (sq cm): 2.5 Instrument Used: Forceps, Scissors Lot #: [id number] Order #: 3 Expiration Date: 05/04/2023 Fenestrated: No Reconstituted: Yes Solution Type:  saline Solution Amount: 5 ml Lot #: [id number] KS Solution Expiration Date: 02/16/2024 Secured: Yes Secured With: Steri-Strips Dressing Applied: Yes Primary Dressing: sorbact, gauze Procedural Pain: 0 Post Procedural Pain: 0 Response to Treatment: Procedure was tolerated well Andrea [name] [name] [name] ([id number]) [id number] Page 2 of 16 Level of Consciousness (Post- Awake and Alert procedure): Post Procedure Diagnosis Same as Pre-procedure Electronic Signature(s) Signed: 08/02/2022 3:02:25 PM By: Duanne Guess MD FACS Signed: 08/02/2022 5:51:31 PM By: Zenaida Deed RN, BSN Entered By: Zenaida Deed on 08/02/2022 14:10:26 -------------------------------------------------------------------------------- Cellular or Tissue Based Product Details Patient Name: Date of Service: Andrea [name] [name] 08/02/2022 1:15 PM Medical Record Number: [id number] Patient Account Number: [id number] Date of Birth/Sex: Treating RN: 02/22/1940 (81 y.o. [name] Primary Care Provider: Jarome Matin Other Clinician: Referring Provider: Treating Provider/Extender: Marena Chancy in Treatment: 35 Cellular or Tissue Based Product Type Wound #1 Right,Medial Malleolus Applied to: Performed By: Physician Duanne Guess, MD Cellular or Tissue Based Product Type: Oasis tri-layer Level of Consciousness (Pre-procedure): Awake and Alert Pre-procedure Verification/Time Out Yes - 14:05 Taken: Location: trunk / arms / legs Wound Size (sq cm): 0.88 Product Size (sq cm): 5 Waste Size (sq cm): 2.5 Waste Reason: wound size Amount of Product Applied (sq cm): 2.5 Instrument Used: Forceps, Scissors Lot #: [id number] Order #: 3 Expiration Date: 05/04/2023 Fenestrated: No Reconstituted: Yes Solution Type: saline Solution Amount: 5 ml Lot #: [id number] KS Solution Expiration Date: 02/16/2024 Secured: Yes Secured With: Steri-Strips Dressing Applied: Yes Primary  Dressing: sorbact, gauze Procedural Pain: 0 Post Procedural Pain: 0 Response to Treatment: Procedure was tolerated well Level of Consciousness (Post- Awake and Alert procedure): Post Procedure Diagnosis Same as Pre-procedure Electronic Signature(s) Signed: 08/02/2022 3:02:25 PM By: Duanne Guess MD FACS Signed: 08/02/2022 5:51:31 PM By: Zenaida Deed RN, BSN Entered By: Zenaida Deed on 08/02/2022 14:10:50 Andrea [name] [name] ([id number]) [id number] Page 3 of 16 -------------------------------------------------------------------------------- Debridement Details Patient Name: Date of Service: Andrea [name]  Andrea [name] [name] 08/02/2022 1:15 PM Medical Record Number: [id number] Patient Account Number: [id number] Date of Birth/Sex: Treating RN: July 24, 1940 (82 y.o. [name] Primary Care Provider: Jarome Matin Other Clinician: Referring Provider: Treating Provider/Extender: Marena Chancy in Treatment: 35 Debridement Performed for Assessment: Wound #1 Right,Medial Malleolus Performed By: Physician Duanne Guess, MD Debridement Type: Debridement Level of Consciousness (Pre-procedure): Awake and Alert Pre-procedure Verification/Time Out Yes - 14:00 Taken: Start Time: 14:02 Pain Control: Lidocaine 4% Topical Solution Percent of Wound Bed Debrided: 100% T Area Debrided (cm): otal 0.69 Tissue and other material debrided: Non-Viable, Eschar, Slough, Skin: Epidermis, Slough Level: Skin/Epidermis Debridement Description: Selective/Open Wound Instrument: Curette Bleeding: Minimum Hemostasis Achieved: Pressure Procedural Pain: 0 Post Procedural Pain: 0 Response to Treatment: Procedure was tolerated well Level of Consciousness (Post- Awake and Alert procedure): Post Debridement Measurements of Total Wound Length: (cm) 1.1 Stage: Category/Stage III Width: (cm) 0.8 Depth: (cm) 0.2 Volume: (cm) 0.138 Character of Wound/Ulcer Post  Debridement: Improved Post Procedure Diagnosis Same as Pre-procedure Notes scribed for Dr. Lady Gary by Zenaida Deed, RN Electronic Signature(s) Signed: 08/02/2022 3:02:25 PM By: Duanne Guess MD FACS Signed: 08/02/2022 5:51:31 PM By: Zenaida Deed RN, BSN Entered By: Zenaida Deed on 08/02/2022 14:04:28 -------------------------------------------------------------------------------- Debridement Details Patient Name: Date of Service: Andrea [name] [name]. 08/02/2022 1:15 PM Medical Record Number: [id number] Patient Account Number: [id number] Date of Birth/Sex: Treating RN: 10/06/1940 (81 y.o. [name] Primary Care Provider: Jarome Matin Other Clinician: Referring Provider: Treating Provider/Extender: Marena Chancy in Treatment: 35 Debridement Performed for Assessment: Wound #4 Left,Lateral Ankle Performed By: Physician Duanne Guess, MD Debridement Type: Debridement Level of Consciousness (Pre-procedure): Awake and Alert Pre-procedure Verification/Time Out Yes - 14:00 RAIELLE, FERGUSON ([id number]) [id number] Page 4 of 16 Yes - 14:00 Taken: Start Time: 14:02 Pain Control: Lidocaine 4% T opical Solution Percent of Wound Bed Debrided: 100% T Area Debrided (cm): otal 0.49 Tissue and other material debrided: Non-Viable, Slough, Slough Level: Non-Viable Tissue Debridement Description: Selective/Open Wound Instrument: Curette Bleeding: None Procedural Pain: 0 Post Procedural Pain: 0 Response to Treatment: Procedure was tolerated well Level of Consciousness (Post- Awake and Alert procedure): Post Debridement Measurements of Total Wound Length: (cm) 0.9 Stage: Category/Stage IV Width: (cm) 0.7 Depth: (cm) 0.1 Volume: (cm) 0.049 Character of Wound/Ulcer Post Debridement: Improved Post Procedure Diagnosis Same as Pre-procedure Notes scribed for Dr. Lady Gary by Zenaida Deed, RN Electronic Signature(s) Signed:  08/02/2022 3:02:25 PM By: Duanne Guess MD FACS Signed: 08/02/2022 5:51:31 PM By: Zenaida Deed RN, BSN Entered By: Zenaida Deed on 08/02/2022 14:05:06 -------------------------------------------------------------------------------- Debridement Details Patient Name: Date of Service: Andrea [name] [name]. 08/02/2022 1:15 PM Medical Record Number: [id number] Patient Account Number: [id number] Date of Birth/Sex: Treating RN: [birth year] (81 y.o. [name] Primary Care Provider: Jarome Matin Other Clinician: Referring Provider: Treating Provider/Extender: Marena Chancy in Treatment: 35 Debridement Performed for Assessment: Wound #2 Right Gluteus Performed By: Physician Duanne Guess, MD Debridement Type: Debridement Level of Consciousness (Pre-procedure): Awake and Alert Pre-procedure Verification/Time Out Yes - 14:00 Taken: Start Time: 14:02 Pain Control: Lidocaine 4% T opical Solution Percent of Wound Bed Debrided: 100% T Area Debrided (cm): otal 1.55 Tissue and other material debrided: Non-Viable, Slough, Slough Level: Non-Viable Tissue Debridement Description: Selective/Open Wound Instrument: Curette Bleeding: Minimum Hemostasis Achieved: Pressure Procedural Pain: 0 Post Procedural Pain: 0 Response to Treatment: Procedure was tolerated well Level of Consciousness (Post- Awake and Alert procedure): Post Debridement Measurements of Total Wound  Length: (cm) 0.9 Width: (cm) 2.2 DEAZIA, PONCEDELEON [name] ([id number]) [id number] Page 5 of 16 Depth: (cm) 3.7 Volume: (cm) 5.754 Character of Wound/Ulcer Post Debridement: Improved Post Procedure Diagnosis Same as Pre-procedure Notes scribed for Dr. Lady Gary by Zenaida Deed, RN Electronic Signature(s) Signed: 08/02/2022 3:02:25 PM By: Duanne Guess MD FACS Signed: 08/02/2022 5:51:31 PM By: Zenaida Deed RN, BSN Entered By: Zenaida Deed on 08/02/2022  14:12:56 -------------------------------------------------------------------------------- HPI Details Patient Name: Date of Service: Andrea [name] [name] 08/02/2022 1:15 PM Medical Record Number: [id number] Patient Account Number: [id number] Date of Birth/Sex: Treating RN: 27-Apr-1940 (81 y.o. 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 She is not diabetic and quit smoking over 40 years ago. 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 She has developed [name] pressure ulcer on the right medial malleolus. In addition, she apparently has had multiple cutaneous abscesses on her buttocks that have required repeated incision and drainage procedures. She has developed [name] large abscess on her right buttock that has become painful. She was recently treated with cephalexin by her PCP for urinary tract infection and also received [name] shot of Rocephin for the abscess, but it has not yet been incised or drained. She is nonambulatory but is able to stand to transfer. She does not wear any sort of Prevalon boot. ABI in clinic today was 0.96. On her right medial malleolus, there is [name] circular ulcer. The surface is dry and fibrotic. There is some periwound erythema, but no malodor or purulent drainage. On her right buttock, there is [name] large fluctuant bulge about the size of an orange. It is also erythematous and tender. 12/04/2021: The culture from her abscess grew out staph. [name] 10-day course of Bactrim was prescribed and she is currently taking this. Unfortunately, Dakin's solution was not delivered and only 2 x 2 gauze was sent, rather than Kerlix so the packing of  the wound has been rather suboptimal. The wound cavity has light slough over all of the surfaces. Although covered, bone is palpable. The medial ankle wound looks about the same with [name] layer of slough accumulation. In addition, her daughter peeled some dry skin off of her foot this morning and she now has denuded areas over the majority of the plantar surface of her right foot. 01/01/2022: The patient has missed several visits and to her daughters report that it is somewhat difficult to get her here on [name] weekly basis. In the interim, she has developed [name] new pressure ulcer on her left lateral malleolus. The plantar surface of her right foot has healed. The cavity on her right buttock has contracted, but it remains quite deep and the trochanter, while not exposed, is palpable at the base. The pressure ulcer on her right medial malleolus is basically unchanged. 02/09/2022: All wounds are little bit smaller today. As the buttock abscess cavity has contracted, it has left some tunneling that has not been adequately packed. Light slough accumulation on both ankle wounds. 02/26/2022: All of the wounds continue to contract. There is slough on both ankle wounds. The buttock ulcer is clean. She brought her wound VAC with her today. 03/12/2022: The ankle wounds are about the same this week with slough accumulation. The buttock ulcer  is smaller and quite clean. She decided that she could not tolerate the discomfort of the wound VAC and removed it. They have been packing the wound with Dakin's moistened gauze. 03/25/2022: The ankle wounds are slightly smaller. Both have slough accumulation. The orifice of the buttock ulcer has gotten quite [name] bit smaller than the underlying cavity. She has decided that she would like to have the wound VAC back. 3/14; patient presents for follow-up. She has bilateral ankle wounds and has been using Hydrofera Blue to the these wound beds. She has Prevalon boots to help with offloading.  She also has [name] sacral wound that we have been using [name] wound VAC for. She has no issues or complaints today. 04/29/2022: She once again decided she did not like the wound VAC and so they have been packing her gluteal wound with Dakin's moistened gauze. The gluteal ulcer seems to be about the same. Her left lateral ankle wound has gotten deeper and larger. The surface tissue is gray with thick slough. No significant change to the right medial ankle wound. 05/18/2022: The depth on the gluteal ulcer has come in [name] bit. There was [name] very strong pungent odor when the packing was removed, however. The right medial ankle wound is about the same size, but the tissue surface looks healthier. The left lateral ankle wound has reaccumulated the rubbery gray slough. 05/31/2022: The gluteal ulcer is shallower and no longer has any dips or crevices. The odor has abated completely. The right medial ankle wound is unchanged. The left lateral ankle wound looks quite [name] bit healthier with [name] cleaner surface and the beginnings of granulation tissue emergence. 06/16/2022: The gluteal ulcer has the same measurements more or less, but overall the volume seems smaller, as the cavity feels tighter on examination. The NOEL, BROOKING ([id number]) [id number] Page 6 of 16 ankle wounds are basically the same. 07/12/2022: The cavity of the buttock ulcer continues to contract. The left ankle wound has [name] little bit more granulation tissue, but bone remains exposed. The right ankle wound is flush with the surrounding skin. 07/21/2022: There is 1 deeper area in the buttock ulcer that I have not appreciated previously. I can feel bone under [name] layer of tissue. The rest of the cavity has contracted. Both ankle wounds are filling in with better granulation tissue. There is slough on both ankle wound surfaces. She struck her leg on her bed frame and has 2 new wounds on her right anterior tibial surface. Both have fat layer  exposure but no concern for infection. 08/02/2022: I do not feel bone as easily in the buttock ulcer. There is slough on the surface. The cavity seems to be contracting. Both ankle wounds have improved tissue quality and there is granulation tissue nearly completely covering the bone on the left lateral malleolus. The wounds on her right anterior tibial surface have dried up. Electronic Signature(s) Signed: 08/02/2022 2:24:02 PM By: Duanne Guess MD FACS Entered By: Duanne Guess on 08/02/2022 14:24:02 -------------------------------------------------------------------------------- Physical Exam Details Patient Name: Date of Service: Andrea [name] [name] 08/02/2022 1:15 PM Medical Record Number: [id number] Patient Account Number: [id number] Date of Birth/Sex: Treating RN: 1940-04-27 (82 y.o. F) Primary Care Provider: Jarome Matin Other Clinician: Referring Provider: Treating Provider/Extender: Marena Chancy in Treatment: 35 Constitutional Slightly hypertensive. . . . no acute distress. Respiratory Normal work of breathing on room air. Notes 08/02/2022: I do not feel bone as easily in the buttock ulcer. There is slough on  the surface. The cavity seems to be contracting. Both ankle wounds have improved tissue quality and there is granulation tissue nearly completely covering the bone on the left lateral malleolus. The wounds on her right anterior tibial surface have dried up. Electronic Signature(s) Signed: 08/02/2022 2:24:39 PM By: Duanne Guess MD FACS Entered By: Duanne Guess on 08/02/2022 14:24:38 -------------------------------------------------------------------------------- Physician Orders Details Patient Name: Date of Service: Andrea [name] [name] 08/02/2022 1:15 PM Medical Record Number: [id number] Patient Account Number: [id number] Date of Birth/Sex: Treating RN: [birth year] (81 y.o. 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 - Dr. Lady Gary - Room 1 Return [name] Tuesday 7/16 @ 2:15 pm Anesthetic (In clinic) Topical Lidocaine 4% applied to wound bed Cellular or Tissue Based Products Cellular or Tissue Based Product Type: - oasis tri-layer #3 Cellular or Tissue Based Product applied to wound bed, secured with steri-strips, cover with Adaptic or Mepitel. (DO NOT REMOVE). Bathing/ Shower/ Hygiene May shower with protection but do not get wound dressing(s) wet. Protect dressing(s) with water repellant cover (for example, large plastic bag) or [name] cast cover and may then take shower. Off-Loading Multipodus Splint to: - prevalon boot to both feet Turn and reposition every 2 hours Home Health No change in wound care orders this week; continue Home Health for wound care. May utilize formulary equivalent dressing for wound treatment orders unless otherwise specified. - may chage outer dressing only on ankles, DO NOT remove sterostrips Dressing changes to be completed by Home Health on Monday / Wednesday / Friday except when patient has scheduled visit at [facility]. Other Home Health Orders/Instructions: - [facility] Wound Treatment Wound #1 - Malleolus  Wound Laterality: Right, Medial Cleanser: Normal Saline (DME) (Generic) 1 x Per Week/30 Days Discharge Instructions: Cleanse the wound with Normal Saline prior to applying [name] clean dressing using gauze sponges, not tissue or cotton balls. Cleanser: Byram Ancillary Kit - 15 Day Supply (DME) (Generic) 1 x Per Week/30 Days Discharge Instructions: Use supplies as instructed; Kit contains: (15) Saline Bullets; (15) 3x3 Gauze; 15 pr Gloves Peri-Wound Care: Skin Prep (Generic) 1 x Per Week/30 Days Discharge Instructions: Use skin prep as directed Prim Dressing: Cutimed Sorbact Swab 1 x Per Week/30 Days ary Discharge Instructions: Apply to wound bed as instructed Prim Dressing: Oasis tri-layer ary 1 x Per Week/30 Days Secured With: [employer], 4.5x3.1 (in/yd) 1 x Per Week/30 Days Discharge Instructions: Secure with Kerlix as directed. Secured With: 13M Medipore Scientist, research (life sciences) Surgical T 2x10 (in/yd) (DME) (Generic) 1 x Per Week/30 Days ape Discharge Instructions: Secure with tape as directed. Wound #2 - Gluteus Wound Laterality: Right Cleanser: Normal Saline (DME) (Generic) 1 x Per Day/30 Days Discharge Instructions: Cleanse the wound with Normal Saline prior to applying [name] clean dressing using gauze sponges, not tissue or cotton balls. Peri-Wound Care: Skin Prep (Generic) 1 x Per Day/30 Days Discharge Instructions:  Use skin prep as directed Prim Dressing: Dakin's Solution 0.25%, 16 (oz) (Generic) 1 x Per Day/30 Days ary Discharge Instructions: Moisten gauze with Dakin's solution Prim Dressing: Promogran Prisma Matrix, 4.34 (sq in) (silver collagen) (DME) (Dispense As Written) 1 x Per Day/30 Days ary Discharge Instructions: Moisten collagen with saline or hydrogel Prim Dressing: cotton tipped applicators (Generic) 1 x Per Day/30 Days ary Discharge Instructions: use to pack wound Secondary Dressing: Zetuvit Plus Silicone Border Dressing 5x5 (in/in) (DME) (Generic) 1 x Per Day/30  Days Discharge Instructions: Apply silicone border over primary dressing as directed. Secured With: 42M Medipore H Soft Cloth Surgical T ape, 4 x 10 (in/yd) (Generic) 1 x Per Day/30 Days Discharge Instructions: Secure with tape as directed. Wound #4 - Ankle Wound Laterality: Left, Lateral SHANEY, AGUSTIN [name] ([id number]) [id number] Page 8 of 16 Cleanser: Normal Saline (DME) (Generic) 1 x Per Week/30 Days Discharge Instructions: Cleanse the wound with Normal Saline prior to applying [name] clean dressing using gauze sponges, not tissue or cotton balls. Cleanser: Byram Ancillary Kit - 15 Day Supply (DME) (Generic) 1 x Per Week/30 Days Discharge Instructions: Use supplies as instructed; Kit contains: (15) Saline Bullets; (15) 3x3 Gauze; 15 pr Gloves Peri-Wound Care: Skin Prep (Generic) 1 x Per Week/30 Days Discharge Instructions: Use skin prep as directed Prim Dressing: Cutimed Sorbact Swab 1 x Per Week/30 Days ary Discharge Instructions: Apply to wound bed as instructed Prim Dressing: Oasis tri-layer ary 1 x Per Week/30 Days Secondary Dressing: ABD Pad, 5x9 1 x Per Week/30 Days Discharge Instructions: Apply over primary dressing as directed. Secured With: [employer], 4.5x3.1 (in/yd) 1 x Per Week/30 Days Discharge Instructions: Secure with Kerlix as directed. Secured With: 42M Medipore Scientist, research (life sciences) Surgical T 2x10 (in/yd) (DME) (Generic) 1 x Per Week/30 Days ape Discharge Instructions: Secure with tape as directed. Electronic Signature(s) Signed: 08/02/2022 3:02:25 PM By: Duanne Guess MD FACS Entered By: Duanne Guess on 08/02/2022 14:24:51 -------------------------------------------------------------------------------- Problem List Details Patient Name: Date of Service: Andrea [name] [name] 08/02/2022 1:15 PM Medical Record Number: [id number] Patient Account Number: [id number] Date of Birth/Sex: Treating RN: 07/20/1940 (81 y.o. 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 without 01/01/2022 No Yes evidence of necrosis L97.812 Non-pressure chronic ulcer of other part of right lower leg with fat layer 07/21/2022 No Yes exposed L02.31 Cutaneous abscess of buttock 11/24/2021 No Yes E27.40 Unspecified adrenocortical insufficiency 11/24/2021 No Yes ZURIAH, EHRGOTT [name] ([id number]) [id number] Page 9 of 16 Z79.52 Long term (current) use of systemic steroids 11/24/2021 No Yes I10 Essential (primary) hypertension 11/24/2021 No Yes Inactive Problems Resolved Problems ICD-10 Code Description Active Date Resolved Date L97.511 Non-pressure chronic ulcer of other part of right foot limited to breakdown of skin 12/04/2021 12/04/2021 Electronic Signature(s) Signed: 08/02/2022 2:22:35 PM By: Duanne Guess MD FACS Entered By: Duanne Guess on 08/02/2022 14:22:35 -------------------------------------------------------------------------------- Progress Note Details Patient Name: Date of Service: Andrea [name] [name] 08/02/2022 1:15 PM Medical Record Number: [id number] Patient Account Number: [id number] Date of Birth/Sex: Treating RN: 1940-04-01 (82 y.o. 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 She is not diabetic and quit smoking over 40 years ago. 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 She has developed [name] pressure ulcer on the right medial malleolus. In addition, she apparently has had multiple cutaneous abscesses on her buttocks that have required repeated incision and drainage procedures. She has developed [name] large abscess on her right buttock that has become painful. She was recently treated with cephalexin by her PCP for urinary tract infection and also received [name] shot of Rocephin for the abscess, but it has not yet been incised or drained. She is nonambulatory but is able to stand to transfer. She does not wear any sort of Prevalon boot. ABI in clinic today was 0.96. On her right medial malleolus, there is [name] circular ulcer. The surface is dry and fibrotic. There is some periwound erythema, but no malodor or purulent drainage. On her right buttock, there is [name] large fluctuant bulge about the size of an orange. It is also erythematous and tender. 12/04/2021: The culture from her abscess grew out staph. [name] 10-day course of Bactrim was prescribed and she is currently taking this. Unfortunately, Dakin's solution was not delivered and only 2 x 2 gauze was sent, rather than Kerlix so the packing of the wound has been rather suboptimal. The wound cavity has light slough over all of the surfaces. Although covered, bone is palpable. The medial ankle wound looks about the same with [name] layer of slough accumulation. In addition, her daughter peeled some dry skin off of her foot this morning and she now has denuded areas over the majority of the plantar surface of her right foot. 01/01/2022: The patient has missed several visits and to her daughters report that it is somewhat difficult to  get her here on [name] weekly basis. In the interim, she has developed [name] new pressure ulcer on her left lateral malleolus. The plantar surface of her right foot has healed. The cavity on her right buttock has contracted, but it remains quite deep and the trochanter, while not exposed, is palpable at the base. The pressure ulcer on her right medial malleolus is basically unchanged. 02/09/2022: All wounds are little bit smaller today. As the buttock abscess cavity has contracted, it has left some tunneling that has not been adequately packed. Light slough accumulation on both ankle wounds. 02/26/2022: All of the wounds continue to contract. There is slough on both ankle wounds. The buttock ulcer is clean. She brought her wound VAC with her today. 03/12/2022: The ankle wounds are about the same this week with slough accumulation. The buttock ulcer is smaller and quite clean. She decided that she could not tolerate the discomfort of the wound VAC and removed it. They have been packing the wound with Dakin's moistened gauze. 03/25/2022: The ankle wounds are slightly smaller. Both have slough accumulation. The orifice of the buttock ulcer has gotten quite [name] bit smaller than the Andrea [name], GOTH [name] ([id number]) [id number] Page 10 of 16 underlying cavity. She has decided that she would like to have the wound VAC back. 3/14; patient presents for follow-up. She has bilateral ankle wounds and has been using  Hydrofera Blue to the these wound beds. She has Prevalon boots to help with offloading. She also has [name] sacral wound that we have been using [name] wound VAC for. She has no issues or complaints today. 04/29/2022: She once again decided she did not like the wound VAC and so they have been packing her gluteal wound with Dakin's moistened gauze. The gluteal ulcer seems to be about the same. Her left lateral ankle wound has gotten deeper and larger. The surface tissue is gray with thick slough. No  significant change to the right medial ankle wound. 05/18/2022: The depth on the gluteal ulcer has come in [name] bit. There was [name] very strong pungent odor when the packing was removed, however. The right medial ankle wound is about the same size, but the tissue surface looks healthier. The left lateral ankle wound has reaccumulated the rubbery gray slough. 05/31/2022: The gluteal ulcer is shallower and no longer has any dips or crevices. The odor has abated completely. The right medial ankle wound is unchanged. The left lateral ankle wound looks quite [name] bit healthier with [name] cleaner surface and the beginnings of granulation tissue emergence. 06/16/2022: The gluteal ulcer has the same measurements more or less, but overall the volume seems smaller, as the cavity feels tighter on examination. The ankle wounds are basically the same. 07/12/2022: The cavity of the buttock ulcer continues to contract. The left ankle wound has [name] little bit more granulation tissue, but bone remains exposed. The right ankle wound is flush with the surrounding skin. 07/21/2022: There is 1 deeper area in the buttock ulcer that I have not appreciated previously. I can feel bone under [name] layer of tissue. The rest of the cavity has contracted. Both ankle wounds are filling in with better granulation tissue. There is slough on both ankle wound surfaces. She struck her leg on her bed frame and has 2 new wounds on her right anterior tibial surface. Both have fat layer exposure but no concern for infection. 08/02/2022: I do not feel bone as easily in the buttock ulcer. There is slough on the surface. The cavity seems to be contracting. Both ankle wounds have improved tissue quality and there is granulation tissue nearly completely covering the bone on the left lateral malleolus. The wounds on her right anterior tibial surface have dried up. Patient History Information obtained from Patient, Caregiver, Chart. Family History Cancer -  Father,Maternal Grandparents,Siblings,Child, Heart Disease - Father, Hypertension - Father, Thyroid Problems - Mother, No family history of Diabetes, Hereditary Spherocytosis, Kidney Disease, Lung Disease, Seizures, Stroke, Tuberculosis. Social History Former smoker - quit 40 yr ago, Marital Status - Widowed, Alcohol Use - Never, Drug Use - No History, Caffeine Use - Daily - tea, soda. Medical History Eyes Patient has history of Cataracts - bil extracted Denies history of Glaucoma, Optic Neuritis Cardiovascular Patient has history of Arrhythmia - afib, Hypertension Endocrine Denies history of Type I Diabetes, Type II Diabetes Genitourinary Denies history of End Stage Renal Disease Integumentary (Skin) Denies history of History of Burn Musculoskeletal Patient has history of Osteoarthritis, Osteomyelitis - pelvic Neurologic Patient has history of Neuropathy Oncologic Denies history of Received Chemotherapy, Received Radiation Psychiatric Patient has history of Confinement Anxiety Denies history of Anorexia/bulimia Hospitalization/Surgery History - TEE. - right ankle fusion. - bil knee replacements. - bil cataract extractions. - posterior lumbar fusion. - vaginal hysterectomy. - cholecystectomy. 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 no acute distress. Vitals Time Taken: 1:21 AM, Height: 59 in, Source: Stated, Weight: 155 lbs, Source: Stated, BMI: 31.3, Temperature: 98.8 F, Pulse: 89 bpm, Respiratory Rate: 18 breaths/min, Blood Pressure: 143/90 mmHg. Respiratory Normal work of breathing on room  air. General Notes: 08/02/2022: I do not feel bone as easily in the buttock ulcer. There is slough on the surface. The cavity seems to be contracting. Both ankle wounds have improved tissue quality and there is granulation tissue nearly completely covering the bone on the left lateral malleolus. The wounds on her right anterior tibial surface have dried up. Integumentary (Hair, Skin) Wound #1 status is Open. Original cause of wound was Pressure Injury. The date acquired was: 11/16/2021. The wound has been in treatment 35 weeks. The wound is located on the Right,Medial Malleolus. The wound measures 1.1cm length x 0.8cm width x 0.1cm depth; 0.691cm^2 area and 0.069cm^3 volume. There is Fat Layer (Subcutaneous Tissue) exposed. There is no tunneling or undermining noted. There is [name] medium amount of serosanguineous drainage noted. The wound margin is distinct with the outline attached to the wound base. There is small (1-33%) red, pink granulation within the wound bed. There is [name] large (67- 100%) amount of necrotic tissue within the wound bed including Adherent Slough. The periwound skin appearance had no abnormalities noted for texture. The periwound skin appearance had no abnormalities noted for moisture. The periwound skin appearance exhibited: Erythema. The surrounding wound skin color is noted with erythema. Erythema is measured at 2.5 cm. Periwound temperature was noted as No Abnormality. Wound #2 status is Open. Original cause of wound was Bump. The date acquired was: 09/28/2021. The wound has been in treatment 35 weeks. The wound is located on the Right Gluteus. The wound measures 0.9cm length x 2.2cm width x 3.7cm depth; 1.555cm^2 area and 5.754cm^3 volume. There is Fat Layer (Subcutaneous Tissue) exposed. There is no tunneling noted, however, there is undermining starting at 1:00 and ending at 3:00 with [name] maximum distance of 4.2cm. There is [name] medium amount of serous drainage noted. The wound margin  is epibole. There is large (67-100%) pink, pale granulation within the wound bed. There is [name] small (1-33%) amount of necrotic tissue within the wound bed including Adherent Slough. The periwound skin appearance had no abnormalities noted for texture. The periwound skin appearance had no abnormalities noted for moisture. The periwound skin appearance had no abnormalities noted for color. Periwound temperature was noted as No Abnormality. Wound #2 status is Open. Original cause of wound was Bump. The date acquired was: 09/28/2021. The wound has been in treatment 35 weeks. The wound is located on the Right Gluteus. The wound measures 0.9cm length x 2.2cm width x 3.7cm depth; 1.555cm^2 area and 5.754cm^3 volume. There is Fat Layer (Subcutaneous Tissue) exposed. There is undermining starting at 1:00 and ending at 3:00 with [name] maximum distance of 4.2cm. There is [name] medium amount of serous drainage noted. The wound margin is well defined and not attached to the wound base. There is large (67-100%) pink, pale granulation within the wound bed. There is [name] small (1-33%) amount of necrotic tissue within the wound bed including Adherent Slough. The periwound skin appearance had no abnormalities noted for texture. The periwound skin appearance had no abnormalities noted for moisture. The periwound skin appearance had no abnormalities noted for color. Periwound  temperature was noted as No Abnormality. Wound #4 status is Open. Original cause of wound was Pressure Injury. The date acquired was: 12/23/2021. The wound has been in treatment 30 weeks. The wound is located on the Left,Lateral Ankle. The wound measures 0.9cm length x 0.7cm width x 0.2cm depth; 0.495cm^2 area and 0.099cm^3 volume. There is bone, tendon, and Fat Layer (Subcutaneous Tissue) exposed. There is no tunneling or undermining noted. There is [name] medium amount of serosanguineous drainage noted. The wound margin is distinct with the outline attached to the  wound base. There is large (67-100%) red granulation within the wound bed. There is [name] small (1-33%) amount of necrotic tissue within the wound bed including Adherent Slough. The periwound skin appearance had no abnormalities noted for texture. The periwound skin appearance had no abnormalities noted for moisture. The periwound skin appearance had no abnormalities noted for color. Periwound temperature was noted as No Abnormality. The periwound has tenderness on palpation. Wound #5 status is Open. Original cause of wound was Skin T ear/Laceration. The date acquired was: 07/16/2022. The wound has been in treatment 1 weeks. The wound is located on the Right Lower Leg. The wound measures 0cm length x 0cm width x 0cm depth; 0cm^2 area and 0cm^3 volume. There is no tunneling or undermining noted. There is [name] none present amount of drainage noted. There is no granulation within the wound bed. There is no necrotic tissue within the wound bed. The periwound skin appearance had no abnormalities noted for texture. The periwound skin appearance had no abnormalities noted for moisture. The periwound skin appearance had no abnormalities noted for color. Periwound temperature was noted as No Abnormality. Wound #6 status is Open. Original cause of wound was Skin T ear/Laceration. The date acquired was: 07/16/2022. The wound has been in treatment 1 weeks. The wound is located on the Right,Distal,Anterior Lower Leg. The wound measures 0cm length x 0cm width x 0cm depth; 0cm^2 area and 0cm^3 volume. There is no tunneling or undermining noted. There is [name] none present amount of drainage noted. There is no granulation within the wound bed. There is no necrotic tissue within the wound bed. The periwound skin appearance had no abnormalities noted for texture. The periwound skin appearance had no abnormalities noted for moisture. The periwound skin appearance had no abnormalities noted for color. Periwound temperature was noted as  No Abnormality. 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 There was [name] Selective/Open Wound Skin/Epidermis Debridement with [name] total area of 0.69 sq cm performed by Duanne Guess, MD. With the following instrument(s): Curette to remove Non-Viable tissue/material. Material removed includes Eschar, Slough, and Skin: Epidermis after achieving pain control using Lidocaine 4% Topical Solution. No specimens were taken. [name] time out was conducted at 14:00, prior to the start of the procedure. [name] Minimum amount of bleeding was controlled with Pressure. The procedure was tolerated well with [name] pain level of 0 throughout and [name] pain level of 0 following the procedure. Post Debridement Measurements: 1.1cm length x 0.8cm width Andrea [name], HAZELRIGG [name] ([id number]) [id number] Page 12 of 16 x 0.2cm depth;  0.138cm^3 volume. Post debridement Stage noted as Category/Stage III. Character of Wound/Ulcer Post Debridement is improved. Post procedure Diagnosis Wound #1: Same as Pre-Procedure General Notes: scribed for Dr. Lady Gary by Zenaida Deed, RN. Pre-procedure diagnosis of Wound #1 is [name] Pressure Ulcer located on the Right,Medial Malleolus. [name] skin graft procedure using [name] bioengineered skin substitute/cellular or tissue based product was performed by Duanne Guess, MD with the following instrument(s): Forceps and Scissors. Oasis tri-layer was applied and secured with Steri-Strips. 2.5 sq cm of  product was utilized and 2.5 sq cm was wasted due to wound size. Post Application, sorbact, gauze was applied. [name] Time Out was conducted at 14:05, prior to the start of the procedure. The procedure was tolerated well with [name] pain level of 0 throughout and [name] pain level of 0 following the procedure. Post procedure Diagnosis Wound #1: Same as Pre-Procedure . Wound #2 Pre-procedure diagnosis of Wound #2 is an Abscess located on the Right Gluteus . There was [name] Selective/Open Wound Non-Viable Tissue Debridement with [name] total area of 1.55 sq cm performed by Duanne Guess, MD. With the following instrument(s): Curette to remove Non-Viable tissue/material. Material removed includes [facility] after achieving pain control using Lidocaine 4% Topical Solution. No specimens were taken. [name] time out was conducted at 14:00, prior to the start of the procedure. [name] Minimum amount of bleeding was controlled with Pressure. The procedure was tolerated well with [name] pain level of 0 throughout and [name] pain level of 0 following the procedure. Post Debridement Measurements: 0.9cm length x 2.2cm width x 3.7cm depth; 5.754cm^3 volume. Character of Wound/Ulcer Post Debridement is improved. Post procedure Diagnosis Wound #2: Same as Pre-Procedure General Notes: scribed for Dr. Lady Gary by Zenaida Deed, RN. 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 There was [name] Selective/Open Wound Non-Viable Tissue Debridement with [name] total area of 0.49 sq cm performed by Duanne Guess, MD. With the following instrument(s): Curette to remove Non-Viable tissue/material. Material removed includes [facility] after achieving pain control using Lidocaine 4% Topical Solution. No specimens were taken. [name] time out was conducted at 14:00, prior to the start of the procedure. There was no bleeding. The procedure was tolerated well with [name] pain level of 0 throughout and [name] pain level of 0 following the  procedure. Post Debridement Measurements: 0.9cm length x 0.7cm width x 0.1cm depth; 0.049cm^3 volume. Post debridement Stage noted as Category/Stage IV. Character of Wound/Ulcer Post Debridement is improved. Post procedure Diagnosis Wound #4: Same as Pre-Procedure General Notes: scribed for Dr. Lady Gary by Zenaida Deed, RN. Pre-procedure diagnosis of Wound #4 is [name] Pressure Ulcer located on the Left,Lateral Ankle. [name] skin graft procedure using [name] bioengineered skin substitute/cellular or tissue based product was performed by Duanne Guess, MD with the following instrument(s): Forceps and Scissors. Oasis tri-layer was applied and secured with Steri-Strips. 2.5 sq cm of product was utilized and 2.5 sq cm was wasted due to wound size. Post Application, sorbact, gauze was applied. [name] Time Out was conducted at 14:05, prior to the start of the procedure. The procedure was tolerated well with [name] pain level of 0 throughout and [name] pain level of 0 following the procedure. Post procedure Diagnosis Wound #4: Same as Pre-Procedure . Plan Follow-up Appointments: Return Appointment in 1 week. - Dr. Lady Gary - Room 1 Tuesday 7/16 @ 2:15 pm Anesthetic: (In clinic) Topical Lidocaine 4% applied to wound bed Cellular or Tissue Based Products: Cellular or Tissue Based Product Type: -  oasis Museum/gallery conservator #3 Cellular or Tissue Based Product applied to wound bed, secured with steri-strips, cover with Adaptic or Mepitel. (DO NOT REMOVE). Bathing/ Shower/ Hygiene: May shower with protection but do not get wound dressing(s) wet. Protect dressing(s) with water repellant cover (for example, large plastic bag) or [name] cast cover and may then take shower. Off-Loading: Multipodus Splint to: - prevalon boot to both feet Turn and reposition every 2 hours Home Health: No change in wound care orders this week; continue Home Health for wound care. May utilize formulary equivalent dressing for wound treatment orders unless otherwise  specified. - may chage outer dressing only on ankles, DO NOT remove sterostrips Dressing changes to be completed by Home Health on Monday / Wednesday / Friday except when patient has scheduled visit at [facility]. Other Home Health Orders/Instructions: - [facility] WOUND #1: - Malleolus Wound Laterality: Right, Medial Cleanser: Normal Saline (DME) (Generic) 1 x Per Week/30 Days Discharge Instructions: Cleanse the wound with Normal Saline prior to applying [name] clean dressing using gauze sponges, not tissue or cotton balls. Cleanser: Byram Ancillary Kit - 15 Day Supply (DME) (Generic) 1 x Per Week/30 Days Discharge Instructions: Use supplies as instructed; Kit contains: (15) Saline Bullets; (15) 3x3 Gauze; 15 pr Gloves Peri-Wound Care: Skin Prep (Generic) 1 x Per Week/30 Days Discharge Instructions: Use skin prep as directed Prim Dressing: Cutimed Sorbact Swab 1 x Per Week/30 Days ary Discharge Instructions: Apply to wound bed as instructed Prim Dressing: Oasis tri-layer 1 x Per Week/30 Days ary Secured With: [employer], 4.5x3.1 (in/yd) 1 x Per Week/30 Days Discharge Instructions: Secure with Kerlix as directed. Secured With: 19M Medipore Scientist, research (life sciences) Surgical T 2x10 (in/yd) (DME) (Generic) 1 x Per Week/30 Days ape Discharge Instructions: Secure with tape as directed. WOUND #2: - Gluteus Wound Laterality: Right Cleanser: Normal Saline (DME) (Generic) 1 x Per Day/30 Days Discharge Instructions: Cleanse the wound with Normal Saline prior to applying [name] clean dressing using gauze sponges, not tissue or cotton balls. Peri-Wound Care: Skin Prep (Generic) 1 x Per Day/30 Days Discharge Instructions: Use skin prep as directed Prim Dressing: Dakin's Solution 0.25%, 16 (oz) (Generic) 1 x Per Day/30 Days ary Discharge Instructions: Moisten gauze with Dakin's solution Prim Dressing: Promogran Prisma Matrix, 4.34 (sq in) (silver collagen) (DME) (Dispense As Written) 1 x Per Day/30  Days ary Discharge Instructions: Moisten collagen with saline or hydrogel Prim Dressing: cotton tipped applicators (Generic) 1 x Per Day/30 Days ary Discharge Instructions: use to pack wound Andrea [name], VIK [name] ([id number]) [id number] Page 13 of 16 Secondary Dressing: Zetuvit Plus Silicone Border Dressing 5x5 (in/in) (DME) (Generic) 1 x Per Day/30 Days Discharge Instructions: Apply silicone border over primary dressing as directed. Secured With: 19M Medipore H Soft Cloth Surgical T ape, 4 x 10 (in/yd) (Generic) 1 x Per Day/30 Days Discharge Instructions: Secure with tape as directed. WOUND #4: - Ankle Wound Laterality: Left, Lateral Cleanser: Normal Saline (DME) (Generic) 1 x Per Week/30 Days Discharge Instructions: Cleanse the wound with Normal Saline prior to applying [name] clean dressing using gauze sponges, not tissue or cotton balls. Cleanser: Byram Ancillary Kit - 15 Day Supply (DME) (Generic) 1 x Per Week/30 Days Discharge Instructions: Use supplies as instructed; Kit contains: (15) Saline Bullets; (15) 3x3 Gauze; 15 pr Gloves Peri-Wound Care: Skin Prep (Generic) 1 x Per Week/30 Days Discharge Instructions: Use skin prep as directed Prim Dressing: Cutimed Sorbact Swab 1 x Per Week/30 Days ary Discharge Instructions: Apply to wound bed as instructed  Prim Dressing: Oasis tri-layer 1 x Per Week/30 Days ary Secondary Dressing: ABD Pad, 5x9 1 x Per Week/30 Days Discharge Instructions: Apply over primary dressing as directed. Secured With: [employer], 4.5x3.1 (in/yd) 1 x Per Week/30 Days Discharge Instructions: Secure with Kerlix as directed. Secured With: 21M Medipore Scientist, research (life sciences) Surgical T 2x10 (in/yd) (DME) (Generic) 1 x Per Week/30 Days ape Discharge Instructions: Secure with tape as directed. 08/02/2022: I do not feel bone as easily in the buttock ulcer. There is slough on the surface. The cavity seems to be contracting. Both ankle wounds have improved  tissue quality and there is granulation tissue nearly completely covering the bone on the left lateral malleolus. The wounds on her right anterior tibial surface have dried up. I used [name] curette to debride slough off of all of the wound surfaces. I also debrided [name] little bit of senescent skin edge from the right medial malleolus ulcer. We will continue to pack the gluteal ulcer using collagen at the base with Dakin's wet-to-dry over this. Oasis Tri layer was rehydrated with saline and applied to each of the ankle wounds in standard fashion. It was secured in place with Sorbact and Steri-Strips. [name] gauze sponge was used as [name] bolster on the left lateral ankle. She will follow-up in 1 week. Electronic Signature(s) Signed: 08/02/2022 2:26:18 PM By: Duanne Guess MD FACS Entered By: Duanne Guess on 08/02/2022 14:26:17 -------------------------------------------------------------------------------- HxROS Details Patient Name: Date of Service: Andrea [name] [name] 08/02/2022 1:15 PM Medical Record Number: [id number] Patient Account Number: [id number] Date of Birth/Sex: Treating RN: 01/05/1941 (81 y.o. F) Primary Care Provider: Jarome Matin Other Clinician: Referring Provider: Treating Provider/Extender: Marena Chancy in Treatment: 35 Information Obtained From Patient Caregiver Chart Constitutional Symptoms (General Health) Medical History: Past Medical History Notes: morbid obesity Eyes Medical History: Positive for: Cataracts - bil extracted Negative for: Glaucoma; Optic Neuritis Ear/Nose/Mouth/Throat Medical History: Past Medical History Notes: hard of hearing Cardiovascular Medical History: Positive for: Arrhythmia - afib; Hypertension Andrea [name], ORRIS [name] ([id number]) [id number] Page 14 of 16 Gastrointestinal Medical History: Past Medical History Notes: GERD Endocrine Medical History: Negative for: Type I Diabetes; Type II  Diabetes Past Medical History Notes: adrenal insufficiency Genitourinary Medical History: Negative for: End Stage Renal Disease Integumentary (Skin) Medical History: Negative for: History of Burn Musculoskeletal Medical History: Positive for: Osteoarthritis; Osteomyelitis - pelvic Past Medical History Notes: psoriatic arthritis, thoracic discitis, displaced spiral fx right tibia Neurologic Medical History: Positive for: Neuropathy Past Medical History Notes: hx SAH Oncologic Medical History: Negative for: Received Chemotherapy; Received Radiation Psychiatric Medical History: Positive for: Confinement Anxiety Negative for: Anorexia/bulimia HBO Extended History Items Eyes: Cataracts Immunizations Pneumococcal Vaccine: Received Pneumococcal Vaccination: Yes Received Pneumococcal Vaccination On or After 60th Birthday: Yes Implantable Devices None Hospitalization / Surgery History Type of Hospitalization/Surgery TEE right ankle fusion bil knee replacements bil cataract extractions posterior lumbar fusion vaginal hysterectomy cholecystectomy Family and Social History Cancer: Yes - Father,Maternal Grandparents,Siblings,Child; Diabetes: No; Heart Disease: Yes - Father; Hereditary Spherocytosis: No; Hypertension: Yes - Father; Kidney Disease: No; Lung Disease: No; Seizures: No; Stroke: No; Thyroid Problems: Yes - Mother; Tuberculosis: No; Former smoker - quit 40 yr ago; Marital Status - Widowed; Alcohol Use: Never; Drug Use: No History; Caffeine Use: Daily - tea, soda; Financial Concerns: No; Food, Clothing or Shelter Needs: No; Support System Lacking: No; Transportation Concerns: No Andrea [name], PARRADO [name] ([id number]) [id number] Page 15 of 16 Electronic Signature(s) Signed: 08/02/2022 3:02:25 PM By: Duanne Guess MD FACS Entered  By: Duanne Guess on 08/02/2022  14:24:16 -------------------------------------------------------------------------------- SuperBill Details Patient Name: Date of Service: Andrea [name] [name] 08/02/2022 Medical Record Number: [id number] Patient Account Number: [id number] Date of Birth/Sex: Treating RN: 03/11/1940 (82 y.o. 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 L02.31 Cutaneous abscess of buttock E27.40 Unspecified adrenocortical insufficiency Z79.52 Long term (current) use of systemic steroids I10 Essential (primary) hypertension Facility Procedures : CPT4: Code [id number] Description: 597 - DEBRIDE WOUND 1ST 20 SQ CM OR < ICD-10 Diagnosis Description 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 Modifier: Quantity: 1 : CPT4: [id number] C o Description: 5271 Application of skin substitute graft to trunk, arms, legs, total wound surface area up to 100 sq cm; first 25 sqcm r less wound surface area ICD-10 Diagnosis Description 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, stage 3 L89.523 Pressure  ulcer of left ankle, stage 3 Modifier: Quantity: 1 : CPT4: [id number] Q Description: 4124 Oasis Ultra per sq CM Modifier: Quantity: 11 Physician Procedures : CPT4: Description Modifier Code [id number] - WC PHYS LEVEL 4 - EST PT 25 ICD-10 Diagnosis Description 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, stage 3 L89.523 Pressure ulcer of left ankle, stage 3 L98.415 Non-pressure chronic ulcer of buttock with  muscle involvement without  evidence of necrosis L97.812 Non-pressure chronic ulcer of other part of right lower leg with fat layer exposed Quantity: 1 : CPT4: [id number] 97597 - WC PHYS DEBR WO ANESTH 20 SQ CM ICD-10 Diagnosis Description 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 Quantity: 1 : [place], CPT4: C5271 Application of skin substitute graft to trunk, arms, legs, total wound surface area up to 100 sq cm; first 25 sqcm or less wound surface area ICD-10 Diagnosis Description 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, stage 3 L89.523 Pressure ulcer  of left ankle, stage 3 Andrea [name] [name] ([id number]) [id number] Pag Quantity: 1 e 16 of 16 Electronic Signature(s) Signed: 08/19/2022 9:40:06 AM By: Pearletha Alfred Signed: 08/19/2022 3:34:47 PM By: Duanne Guess MD FACS Previous Signature: 08/02/2022 3:47:08 PM Version By: Duanne Guess MD FACS Previous Signature: 08/02/2022 5:51:31 PM Version By: Zenaida Deed RN, BSN Previous Signature: 08/02/2022 2:27:11 PM Version By: Duanne Guess MD FACS Entered By: Pearletha Alfred on 08/19/2022 09:40:05

## 2022-08-03 NOTE — Progress Notes (Signed)
Andrea, [name] [name] ([id number]) [id number] Page 1 of 14 Visit Report for 08/02/2022 Arrival Information Details Patient Name: Date of Service: Andrea [name] 08/02/2022 1:15 PM Medical Record Number: [id number] Patient Account Number: [id number] Date of Birth/Sex: Treating RN: [birth year] (82 y.o. F) Primary Care Andrea [name]: Andrea [name] Other Clinician: Referring Andrea [name]: Treating Andrea [name]/Extender: Andrea [name] in Treatment: 35 Visit Information History Since Last Visit All ordered tests and consults were completed: No Patient Arrived: Wheel Chair Added or deleted any medications: No Arrival Time: 13:21 Any new allergies or adverse reactions: No Accompanied By: daughter Had [name] fall or experienced change in No Transfer Assistance: None activities of daily living that may affect Patient Identification Verified: Yes risk of falls: Secondary Verification Process Completed: Yes Signs or symptoms of abuse/neglect since last visito No Patient Requires Transmission-Based Precautions: No Hospitalized since last visit: No Patient Has Alerts: No Implantable device outside of the clinic excluding No cellular tissue based products placed in the center since last visit: Has Dressing in Place as Prescribed: Yes Pain Present Now: No Electronic Signature(s) Signed: 08/02/2022 5:51:31 PM By: Andrea Deed RN, BSN Entered By: Andrea [name] on 08/02/2022 13:42:19 -------------------------------------------------------------------------------- Encounter Discharge Information Details Patient Name: Date of Service: Andrea [name] 08/02/2022 1:15 PM Medical Record Number: [id number] Patient Account Number: [id number] Date of Birth/Sex: Treating RN: [name] (82 y.o. Andrea [name] Primary Care Andrea [name]: Andrea [name] Other Clinician: Referring Andrea [name]: Treating Andrea [name]/Extender: Andrea [name] in  Treatment: 35 Encounter Discharge Information Items Post Procedure Vitals Discharge Condition: Stable Temperature (F): 98.8 Ambulatory Status: Wheelchair Pulse (bpm): 89 Discharge Destination: Home Respiratory Rate (breaths/min): 18 Transportation: Private Auto Blood Pressure (mmHg): 143/90 Accompanied By: caregiver Schedule Follow-up Appointment: Yes Clinical Summary of Care: Patient Declined Electronic Signature(s) Signed: 08/02/2022 5:51:31 PM By: Andrea Deed RN, BSN Entered By: Andrea [name] on 08/02/2022 15:28:09 [name] ([id number]) [id number] Page 2 of 14 -------------------------------------------------------------------------------- Lower Extremity Assessment Details Patient Name: Date of Service: Andrea [name] 08/02/2022 1:15 PM Medical Record Number: [id number] Patient Account Number: [id number] Date of Birth/Sex: Treating RN: Andrea [name] (82 y.o. Andrea [name] Primary Care Andrea [name]: Andrea [name] Other Clinician: Referring Andrea [name]: Treating Andrea [name]/Extender: Andrea [name] in Treatment: 35 Edema Assessment Assessed: [Left: No] [Right: No] Edema: [Left: No] [Right: No] Calf Left: Right: Point of Measurement: From Medial Instep 31 cm 32 cm Ankle Left: Right: Point of Measurement: From Medial Instep 18 cm 21 cm Vascular Assessment Pulses: Dorsalis Pedis Palpable: [Left:Yes] [Right:Yes] Electronic Signature(s) Signed: 08/02/2022 5:51:31 PM By: Andrea Deed RN, BSN Entered By: Andrea [name] on 08/02/2022 13:52:04 -------------------------------------------------------------------------------- Multi Wound Chart Details Patient Name: Date of Service: Andrea [name] 08/02/2022 1:15 PM Medical Record Number: [id number] Patient Account Number: [id number] Date of Birth/Sex: Treating RN: [name] (82 y.o. F) Primary Care Andrea [name]: Andrea [name] Other Clinician: Referring  Andrea [name]: Treating Andrea [name]/Extender: Andrea [name] in Treatment: 35 Vital Signs Height(in): 59 Pulse(bpm): 89 Weight(lbs): 155 Blood Pressure(mmHg): 143/90 Body Mass Index(BMI): 31.3 Temperature(F): 98.8 Respiratory Rate(breaths/min): 18 [1:Photos:] [2:No Photos] Right, Medial Malleolus Right Gluteus Right Gluteus Wound Location: Pressure Injury Bump Bump Wounding Event: Pressure Ulcer Abscess Abscess Primary Etiology: Cataracts, Arrhythmia, Hypertension,Cataracts, Arrhythmia, Hypertension, Cataracts, Arrhythmia, Hypertension, Comorbid History: Osteoarthritis, Osteomyelitis, Osteoarthritis, Osteomyelitis, Osteoarthritis, Osteomyelitis, Neuropathy, Confinement Anxiety Neuropathy, Confinement Anxiety Neuropathy, Confinement Anxiety 11/16/2021 09/28/2021 09/28/2021 Date Acquired: 35 35 35 Weeks of Treatment: Open Open Open Wound Status: No No No Wound Recurrence: 1.1x0.8x0.1 0.9x2.2x3.7  0.9x2.2x3.7 Measurements L x W x D (cm) 0.691 1.555 1.555 [name] (cm) : rea 0.069 5.754 5.754 Volume (cm) : 55.10% 85.10% 85.10% % Reduction in [name] rea: 55.20% 81.60% 81.60% % Reduction in Volume: 1 1 Starting Position 1 (o'clock): 3 3 Ending Position 1 (o'clock): 4.2 4.2 Maximum Distance 1 (cm): No Yes Yes Undermining: Category/Stage III Full Thickness With Exposed Support Full Thickness With Exposed Support Classification: Structures Structures Medium Medium Medium Exudate [name] mount: Serosanguineous Serous Serous Exudate Type: red, brown amber amber Exudate Color: Distinct, outline attached Epibole Well defined, not attached Wound Margin: Small (1-33%) Large (67-100%) Large (67-100%) Granulation [name] mount: Red, Pink Pink, Pale Pink, Pale Granulation Quality: Large (67-100%) Small (1-33%) Small (1-33%) Necrotic [name] mount: Fat Layer (Subcutaneous Tissue): Yes Fat Layer (Subcutaneous Tissue): Yes Fat Layer (Subcutaneous Tissue): Yes Exposed Structures: Fascia:  No Fascia: No Fascia: No Tendon: No Tendon: No Tendon: No Muscle: No Muscle: No Muscle: No Joint: No Joint: No Joint: No Bone: No Bone: No Bone: No Small (1-33%) None None Epithelialization: Debridement - Selective/Open Wound Debridement - Selective/Open Wound Debridement - Selective/Open Wound Debridement: Pre-procedure Verification/Time Out 14:00 14:00 14:00 Taken: Lidocaine 4% Topical Solution Lidocaine 4% Topical Solution Lidocaine 4% Topical Solution Pain Control: Necrotic/Eschar, [employer] Tissue Debrided: Skin/Epidermis Non-Viable Tissue Non-Viable Tissue Level: 0.69 1.55 1.55 Debridement [name] (sq cm): rea Curette Curette Curette Instrument: Minimum Minimum Minimum Bleeding: Pressure Pressure Pressure Hemostasis [name] chieved: 0 0 0 Procedural Pain: 0 0 0 Post Procedural Pain: Procedure was tolerated well Procedure was tolerated well Procedure was tolerated well Debridement Treatment Response: 1.1x0.8x0.2 0.9x2.2x3.7 0.9x2.2x3.7 Post Debridement Measurements L x W x D (cm) 0.138 5.754 5.754 Post Debridement Volume: (cm) Category/Stage III N/[name] N/[name] Post Debridement Stage: No Abnormalities Noted No Abnormalities Noted No Abnormalities Noted Periwound Skin Texture: No Abnormalities Noted No Abnormalities Noted No Abnormalities Noted Periwound Skin Moisture: Erythema: Yes Erythema: No Erythema: No Periwound Skin Color: Measured: 2.5cm N/[name] N/[name] Erythema Measurement: Increased N/[name] N/[name] Erythema Change: No Abnormality No Abnormality No Abnormality Temperature: Cellular or Tissue Based Product Debridement Debridement Procedures Performed: Debridement Wound Number: 4 5 6  Photos: Left, Lateral Ankle Right Lower Leg Right, Distal, Anterior Lower Leg Wound Location: Pressure Injury Skin Tear/Laceration Skin Tear/Laceration Wounding Event: Pressure Ulcer Skin Tear Skin Tear Primary Etiology: Cataracts, Arrhythmia, Hypertension, Cataracts,  Arrhythmia, Hypertension, Cataracts, Arrhythmia, Hypertension, Comorbid History: Osteoarthritis, Osteomyelitis, Osteoarthritis, Osteomyelitis, Osteoarthritis, Osteomyelitis, Neuropathy, Confinement Anxiety Neuropathy, Confinement Anxiety Neuropathy, Confinement Anxiety 12/23/2021 07/16/2022 07/16/2022 Date Acquired: 30 1 1  Weeks of Treatment: Open Open Open Wound Status: No No No Wound Recurrence: 0.9x0.7x0.2 0x0x0 0x0x0 Measurements L x W x D (cm) 0.495 0 0 [name] (cm) : rea 0.099 0 0 Volume (cm) : Andrea [name], Andrea [name] ([id number]) [id number] Page 4 of 14 -251.10% 100.00% 100.00% % Reduction in Area: -607.10% 100.00% 100.00% % Reduction in Volume: No No No Undermining: Category/Stage IV Full Thickness Without Exposed Full Thickness Without Exposed Classification: Support Structures Support Structures Medium None Present None Present Exudate [name] mount: Serosanguineous N/[name] N/[name] Exudate Type: red, brown N/[name] N/[name] Exudate Color: Distinct, outline attached N/[name] N/[name] Wound Margin: Large (67-100%) None Present (0%) None Present (0%) Granulation [name] mount: Red N/[name] N/[name] Granulation Quality: Small (1-33%) None Present (0%) None Present (0%) Necrotic [name] mount: Fat Layer (Subcutaneous Tissue): Yes Fascia: No Fascia: No Exposed Structures: Tendon: Yes Fat Layer (Subcutaneous Tissue): No Fat Layer (Subcutaneous Tissue): No Bone: Yes Tendon: No Tendon: No Fascia: No Muscle: No Muscle: No Muscle: No Joint: No  Joint: No Joint: No Bone: No Bone: No Small (1-33%) Large (67-100%) Large (67-100%) Epithelialization: Debridement - Selective/Open Wound N/[name] N/[name] Debridement: Pre-procedure Verification/Time Out 14:00 N/[name] N/[name] Taken: Lidocaine 4% Topical Solution N/[name] N/[name] Pain Control: Slough N/[name] N/[name] Tissue Debrided: Non-Viable Tissue N/[name] N/[name] Level: 0.49 N/[name] N/[name] Debridement [name] (sq cm): rea Curette N/[name] N/[name] Instrument: None N/[name] N/[name] Bleeding: N/[name] N/[name] N/[name] Hemostasis [name]  chieved: 0 N/[name] N/[name] Procedural Pain: 0 N/[name] N/[name] Post Procedural Pain: Procedure was tolerated well N/[name] N/[name] Debridement Treatment Response: 0.9x0.7x0.1 N/[name] N/[name] Post Debridement Measurements L x W x D (cm) 0.049 N/[name] N/[name] Post Debridement Volume: (cm) Category/Stage IV N/[name] N/[name] Post Debridement Stage: No Abnormalities Noted No Abnormalities Noted No Abnormalities Noted Periwound Skin Texture: No Abnormalities Noted No Abnormalities Noted No Abnormalities Noted Periwound Skin Moisture: Erythema: No No Abnormalities Noted No Abnormalities Noted Periwound Skin Color: Rubor: No N/[name] N/[name] N/[name] Erythema Measurement: N/[name] N/[name] N/[name] Erythema Change: No Abnormality No Abnormality No Abnormality Temperature: Yes N/[name] N/[name] Tenderness on Palpation: Cellular or Tissue Based Product N/[name] N/[name] Procedures Performed: Debridement Treatment Notes Electronic Signature(s) Signed: 08/02/2022 2:22:45 PM By: Duanne Guess MD FACS Entered By: Duanne Guess on 08/02/2022 14:22:45 -------------------------------------------------------------------------------- Multi-Disciplinary Care Plan Details Patient Name: Date of Service: Andrea [name] 08/02/2022 1:15 PM Medical Record Number: [id number] Patient Account Number: [id number] Date of Birth/Sex: Treating RN: [name] (81 y.o. Andrea [name] Primary Care Chrisanne Loose: Andrea [name] Other Clinician: Referring Arial Galligan: Treating Moesha Sarchet/Extender: Andrea [name] in Treatment: 35 Multidisciplinary Care Plan reviewed with physician Active Inactive Pressure Nursing Diagnoses: Knowledge deficit related to causes and risk factors for pressure ulcer development Andrea [name], Andrea [name] ([id number]) [id number] Page 5 of 14 Knowledge deficit related to management of pressures ulcers Potential for impaired tissue integrity related to pressure, friction, moisture, and shear Goals: Patient/caregiver will verbalize  understanding of pressure ulcer management Date Initiated: 11/24/2021 Target Resolution Date: 08/09/2022 Goal Status: Active Interventions: Assess: immobility, friction, shearing, incontinence upon admission and as needed Assess offloading mechanisms upon admission and as needed Assess potential for pressure ulcer upon admission and as needed Provide education on pressure ulcers Treatment Activities: Pressure reduction/relief device ordered : 11/24/2021 Notes: Wound/Skin Impairment Nursing Diagnoses: Impaired tissue integrity Knowledge deficit related to ulceration/compromised skin integrity Goals: Patient/caregiver will verbalize understanding of skin care regimen Date Initiated: 11/24/2021 Target Resolution Date: 08/09/2022 Goal Status: Active Ulcer/skin breakdown will have [name] volume reduction of 30% by week 4 Date Initiated: 11/24/2021 Date Inactivated: 05/31/2022 Target Resolution Date: 05/25/2022 Unmet Reason: refuses VAC, Goal Status: Unmet noncompliant with offloading Interventions: Assess patient/caregiver ability to obtain necessary supplies Assess patient/caregiver ability to perform ulcer/skin care regimen upon admission and as needed Assess ulceration(s) every visit Provide education on ulcer and skin care Treatment Activities: Skin care regimen initiated : 11/24/2021 Topical wound management initiated : 11/24/2021 Notes: Electronic Signature(s) Signed: 08/02/2022 5:51:31 PM By: Andrea Deed RN, BSN Entered By: Andrea [name] on 08/02/2022 13:57:17 -------------------------------------------------------------------------------- Pain Assessment Details Patient Name: Date of Service: Andrea [name] 08/02/2022 1:15 PM Medical Record Number: [id number] Patient Account Number: [id number] Date of Birth/Sex: Treating RN: [name] (82 y.o. F) Primary Care Theodoros Stjames: Andrea [name] Other Clinician: Referring Azel Gumina: Treating Kashonda Sarkisyan/Extender: Andrea [name] in Treatment: 35 Active Problems Location of Pain Severity and Description of Pain Patient Has Paino Yes Site Locations Pain Location: DYAMOND, PONGRACZ ([id number]) [id number] Page 6 of 14 Pain Location: Pain in Ulcers With Dressing Change: Yes Duration of the  Pain. Constant / Intermittento Intermittent Rate the pain. Current Pain Level: 0 Worst Pain Level: 7 Least Pain Level: 0 Tolerable Pain Level: 3 Character of Pain Describe the Pain: Aching, Other: sore Pain Management and Medication Current Pain Management: Other: reposition Is the Current Pain Management Adequate: Adequate Electronic Signature(s) Signed: 08/02/2022 5:51:31 PM By: Andrea Deed RN, BSN Entered By: Andrea [name] on 08/02/2022 13:43:43 -------------------------------------------------------------------------------- Patient/Caregiver Education Details Patient Name: Date of Service: Andrea [name] 7/8/2024andnbsp1:15 PM Medical Record Number: [id number] Patient Account Number: [id number] Date of Birth/Gender: Treating RN: [name] (81 y.o. Andrea [name] Primary Care Physician: Andrea [name] Other Clinician: Referring Physician: Treating Physician/Extender: Andrea [name] in Treatment: 35 Education Assessment Education Provided To: Patient Education Topics Provided Pressure: Methods: Explain/Verbal Responses: Reinforcements needed, State content correctly Wound/Skin Impairment: Methods: Explain/Verbal Responses: Reinforcements needed, State content correctly Electronic Signature(s) Signed: 08/02/2022 5:51:31 PM By: Andrea Deed RN, BSN Entered By: Andrea [name] on 08/02/2022 13:57:56 Andrea [name] ([id number]) [id number] Page 7 of 14 -------------------------------------------------------------------------------- Wound Assessment Details Patient Name: Date of  Service: Andrea [name] 08/02/2022 1:15 PM Medical Record Number: [id number] Patient Account Number: [id number] Date of Birth/Sex: Treating RN: [name][birth year] (82 y.o. F) Primary Care Kairee Kozma: Andrea [name] Other Clinician: Referring Dominick Morella: Treating Keidan Aumiller/Extender: Andrea [name] in Treatment: 35 Wound Status Wound Number: 1 Primary Pressure Ulcer Etiology: Wound Location: Right, Medial Malleolus Wound Open Wounding Event: Pressure Injury Status: Date Acquired: 11/16/2021 Comorbid Cataracts, Arrhythmia, Hypertension, Osteoarthritis, Weeks Of Treatment: 35 History: Osteomyelitis, Neuropathy, Confinement Anxiety Clustered Wound: No Photos Wound Measurements Length: (cm) 1 Width: (cm) 0 Depth: (cm) 0 Area: (cm) Volume: (cm) .1 % Reduction in Area: 55.1% .8 % Reduction in Volume: 55.2% .1 Epithelialization: Small (1-33%) 0.691 Tunneling: No 0.069 Undermining: No Wound Description Classification: Category/Stage III Wound Margin: Distinct, outline attached Exudate Amount: Medium Exudate Type: Serosanguineous Exudate Color: red, brown Foul Odor After Cleansing: No Slough/Fibrino Yes Wound Bed Granulation Amount: Small (1-33%) Exposed Structure Granulation Quality: Red, Pink Fascia Exposed: No Necrotic Amount: Large (67-100%) Fat Layer (Subcutaneous Tissue) Exposed: Yes Necrotic Quality: Adherent Slough Tendon Exposed: No Muscle Exposed: No Joint Exposed: No Bone Exposed: No Periwound Skin Texture Texture Color No Abnormalities Noted: Yes No Abnormalities Noted: No Erythema: Yes Moisture Erythema Measurement: Measured No Abnormalities Noted: Yes 2.5 cm Erythema Change: Increased Temperature / Pain Temperature: No Abnormality Treatment Notes Wound #1 (Malleolus) Wound Laterality: Right, Medial Andrea [name], Andrea [name] ([id number]) [id number] Page 8 of 14 Cleanser Normal Saline Discharge Instruction: Cleanse  the wound with Normal Saline prior to applying [name] clean dressing using gauze sponges, not tissue or cotton balls. [employer]ram Ancillary Kit - 15 Day Supply Discharge Instruction: Use supplies as instructed; Kit contains: (15) Saline Bullets; (15) 3x3 Gauze; 15 pr Gloves Peri-Wound Care Skin Prep Discharge Instruction: Use skin prep as directed Topical Primary Dressing Cutimed Sorbact Swab Discharge Instruction: Apply to wound bed as instructed Oasis tri-layer Secondary Dressing Secured With [employer] Sterile, 4.5x3.1 (in/yd) Discharge Instruction: Secure with Kerlix as directed. 51M Medipore Soft Cloth Surgical T 2x10 (in/yd) ape Discharge Instruction: Secure with tape as directed. Compression Wrap Compression Stockings Add-Ons Electronic Signature(s) Signed: 08/02/2022 3:58:16 PM By: Dayton Scrape Entered By: Dayton Scrape on 08/02/2022 13:51:01 -------------------------------------------------------------------------------- Wound Assessment Details Patient Name: Date of Service: Andrea [name] 08/02/2022 1:15 PM Medical Record Number: [id number] Patient Account Number: [id number] Date of Birth/Sex: Treating RN: Andrea 31, [name] (82 y.o. Andrea [name] Primary Care Njeri Vicente: Andrea [name]  Other Clinician: Referring Pearlee Arvizu: Treating Danica Camarena/Extender: Andrea [name] in Treatment: 35 Wound Status Wound Number: 2 Primary Abscess Etiology: Wound Location: Right Gluteus Wound Open Wounding Event: Bump Status: Date Acquired: 09/28/2021 Comorbid Cataracts, Arrhythmia, Hypertension, Osteoarthritis, Weeks Of Treatment: 35 History: Osteomyelitis, Neuropathy, Confinement Anxiety Clustered Wound: No Wound Measurements Length: (cm) 0.9 Width: (cm) 2.2 Depth: (cm) 3.7 Area: (cm) 1.555 Volume: (cm) 5.754 % Reduction in Area: 85.1% % Reduction in Volume: 81.6% Epithelialization: None Tunneling: No Undermining: Yes Starting Position (o'clock): 1 Ending  Position (o'clock): 3 Maximum Distance: (cm) 4.2 Wound Description Classification: Full Thickness With Exposed Support Structures Wound Margin: Andrea [name], Andrea [name] ([id number]) Exudate Amount: Medium Exudate Type: Serous Exudate Color: amber Foul Odor After Cleansing: No Slough/Fibrino Yes [id number] Page 9 of 14 Wound Bed Granulation Amount: Large (67-100%) Exposed Structure Granulation Quality: Pink, Pale Fascia Exposed: No Necrotic Amount: Small (1-33%) Fat Layer (Subcutaneous Tissue) Exposed: Yes Necrotic Quality: Adherent Slough Tendon Exposed: No Muscle Exposed: No Joint Exposed: No Bone Exposed: No Periwound Skin Texture Texture Color No Abnormalities Noted: Yes No Abnormalities Noted: Yes Moisture Temperature / Pain No Abnormalities Noted: Yes Temperature: No Abnormality Electronic Signature(s) Signed: 08/02/2022 5:51:31 PM By: Andrea Deed RN, BSN Entered By: Andrea [name] on 08/02/2022 13:49:17 -------------------------------------------------------------------------------- Wound Assessment Details Patient Name: Date of Service: Andrea [name] 08/02/2022 1:15 PM Medical Record Number: [id number] Patient Account Number: [id number] Date of Birth/Sex: Treating RN: [birth year] (81 y.o. Billy Coast, Linda Primary Care Jacson Rapaport: Andrea [name] Other Clinician: Referring Mishell Donalson: Treating Azyah Flett/Extender: Andrea [name] in Treatment: 35 Wound Status Wound Number: 2 Primary Abscess Etiology: Wound Location: Right Gluteus Wound Open Wounding Event: Bump Status: Date Acquired: 09/28/2021 Comorbid Cataracts, Arrhythmia, Hypertension, Osteoarthritis, Weeks Of Treatment: 35 History: Osteomyelitis, Neuropathy, Confinement Anxiety Clustered Wound: No Photos Wound Measurements Length: (cm) 0.9 Width: (cm) 2.2 Depth: (cm) 3.7 Area: (cm) 1.555 Volume: (cm) 5.754 % Reduction in Area: 85.1% % Reduction  in Volume: 81.6% Epithelialization: None Undermining: Yes Starting Position (o'clock): 1 Ending Position (o'clock): 3 Maximum Distance: (cm) 4.2 Wound Description Andrea [name], Andrea [name] ([id number]) Classification: Full Thickness With Exposed Support Structures Wound Margin: Well defined, not attached Exudate Amount: Medium Exudate Type: Serous Exudate Color: amber [id number] Page 10 of 14 Foul Odor After Cleansing: No Slough/Fibrino Yes Wound Bed Granulation Amount: Large (67-100%) Exposed Structure Granulation Quality: Pink, Pale Fascia Exposed: No Necrotic Amount: Small (1-33%) Fat Layer (Subcutaneous Tissue) Exposed: Yes Necrotic Quality: Adherent Slough Tendon Exposed: No Muscle Exposed: No Joint Exposed: No Bone Exposed: No Periwound Skin Texture Texture Color No Abnormalities Noted: Yes No Abnormalities Noted: Yes Moisture Temperature / Pain No Abnormalities Noted: Yes Temperature: No Abnormality Treatment Notes Wound #2 (Gluteus) Wound Laterality: Right Cleanser Normal Saline Discharge Instruction: Cleanse the wound with Normal Saline prior to applying [name] clean dressing using gauze sponges, not tissue or cotton balls. Peri-Wound Care Skin Prep Discharge Instruction: Use skin prep as directed Topical Primary Dressing Dakin's Solution 0.25%, 16 (oz) Discharge Instruction: Moisten gauze with Dakin's solution Promogran Prisma Matrix, 4.34 (sq in) (silver collagen) Discharge Instruction: Moisten collagen with saline or hydrogel cotton tipped applicators Discharge Instruction: use to pack wound Secondary Dressing Zetuvit Plus Silicone Border Dressing 5x5 (in/in) Discharge Instruction: Apply silicone border over primary dressing as directed. Secured With 90M Medipore H Soft Cloth Surgical T ape, 4 x 10 (in/yd) Discharge Instruction: Secure with tape as directed. Compression Wrap Compression Stockings Add-Ons Electronic Signature(s) Signed:  08/02/2022 3:58:16 PM By: Dayton Scrape  Signed: 08/02/2022 5:51:31 PM By: Andrea Deed RN, BSN Entered By: Dayton Scrape on 08/02/2022 13:49:58 -------------------------------------------------------------------------------- Wound Assessment Details Patient Name: Date of Service: Andrea [name] 08/02/2022 1:15 PM Medical Record Number: [id number] Patient Account Number: [id number] Andrea [name], Andrea [name] ([id number]) [id number] Page 11 of 14 Date of Birth/Sex: Treating RN: [name] (82 y.o. F) Primary Care Nyasha Rahilly: Other Clinician: Jarome [name] Referring Hasel Janish: Treating Shaylon Gillean/Extender: Andrea [name] in Treatment: 35 Wound Status Wound Number: 4 Primary Pressure Ulcer Etiology: Wound Location: Left, Lateral Ankle Wound Open Wounding Event: Pressure Injury Status: Date Acquired: 12/23/2021 Comorbid Cataracts, Arrhythmia, Hypertension, Osteoarthritis, Weeks Of Treatment: 30 History: Osteomyelitis, Neuropathy, Confinement Anxiety Clustered Wound: No Photos Wound Measurements Length: (cm) 0.9 Width: (cm) 0.7 Depth: (cm) 0.2 Area: (cm) 0.495 Volume: (cm) 0.099 % Reduction in Area: -251.1% % Reduction in Volume: -607.1% Epithelialization: Small (1-33%) Tunneling: No Undermining: No Wound Description Classification: Category/Stage IV Wound Margin: Distinct, outline attached Exudate Amount: Medium Exudate Type: Serosanguineous Exudate Color: red, brown Foul Odor After Cleansing: No Slough/Fibrino Yes Wound Bed Granulation Amount: Large (67-100%) Exposed Structure Granulation Quality: Red Fascia Exposed: No Necrotic Amount: Small (1-33%) Fat Layer (Subcutaneous Tissue) Exposed: Yes Necrotic Quality: Adherent Slough Tendon Exposed: Yes Muscle Exposed: No Joint Exposed: No Bone Exposed: Yes Periwound Skin Texture Texture Color No Abnormalities Noted: Yes No Abnormalities Noted: Yes Moisture Temperature / Pain No  Abnormalities Noted: Yes Temperature: No Abnormality Tenderness on Palpation: Yes Treatment Notes Wound #4 (Ankle) Wound Laterality: Left, Lateral Cleanser Normal Saline Discharge Instruction: Cleanse the wound with Normal Saline prior to applying [name] clean dressing using gauze sponges, not tissue or cotton balls. [facility] Ancillary Kit - 15 Day Supply Discharge Instruction: Use supplies as instructed; Kit contains: (15) Saline Bullets; (15) 3x3 Gauze; 15 pr Gloves Peri-Wound Care Skin Prep Discharge Instruction: Use skin prep as directed Topical Andrea [name], Andrea [name] ([id number]) [id number] Page 12 of 14 Primary Dressing Cutimed Sorbact Swab Discharge Instruction: Apply to wound bed as instructed Oasis tri-layer Secondary Dressing ABD Pad, 5x9 Discharge Instruction: Apply over primary dressing as directed. Secured With [employer], 4.5x3.1 (in/yd) Discharge Instruction: Secure with Kerlix as directed. 76M Medipore Soft Cloth Surgical T 2x10 (in/yd) ape Discharge Instruction: Secure with tape as directed. Compression Wrap Compression Stockings Add-Ons Electronic Signature(s) Signed: 08/02/2022 3:58:16 PM By: Dayton Scrape Entered By: Dayton Scrape on 08/02/2022 13:51:40 -------------------------------------------------------------------------------- Wound Assessment Details Patient Name: Date of Service: Andrea [name] 08/02/2022 1:15 PM Medical Record Number: [id number] Patient Account Number: [id number] Date of Birth/Sex: Treating RN: [birth year] [name] (82 y.o. F) Primary Care Myking Sar: Andrea [name] Other Clinician: Referring Fenix Rorke: Treating Esthefany Herrig/Extender: Andrea [name] in Treatment: 35 Wound Status Wound Number: 5 Primary Skin Tear Etiology: Wound Location: Right Lower Leg Wound Open Wounding Event: Skin Tear/Laceration Status: Date Acquired: 07/16/2022 Comorbid Cataracts, Arrhythmia, Hypertension,  Osteoarthritis, Weeks Of Treatment: 1 History: Osteomyelitis, Neuropathy, Confinement Anxiety Clustered Wound: No Photos Wound Measurements Length: (cm) Width: (cm) Depth: (cm) Area: (cm) Volume: (cm) 0 % Reduction in Area: 100% 0 % Reduction in Volume: 100% 0 Epithelialization: Large (67-100%) 0 Tunneling: No 0 Undermining: No Wound Description Classification: Full Thickness Without Exposed Support Structures Exudate Amount: None Present Andrea [name], Andrea [name] ([id number]) Foul Odor After Cleansing: No Slough/Fibrino No [id number] Page 13 of 14 Wound Bed Granulation Amount: None Present (0%) Exposed Structure Necrotic Amount: None Present (0%) Fascia Exposed: No Fat Layer (Subcutaneous Tissue) Exposed: No Tendon Exposed: No Muscle Exposed: No Joint Exposed: No Bone  Exposed: No Periwound Skin Texture Texture Color No Abnormalities Noted: Yes No Abnormalities Noted: Yes Moisture Temperature / Pain No Abnormalities Noted: Yes Temperature: No Abnormality Electronic Signature(s) Signed: 08/02/2022 3:58:16 PM By: Dayton Scrape Entered By: Dayton Scrape on 08/02/2022 13:52:17 -------------------------------------------------------------------------------- Wound Assessment Details Patient Name: Date of Service: Andrea [name] 08/02/2022 1:15 PM Medical Record Number: [id number] Patient Account Number: [id number] Date of Birth/Sex: Treating RN: [birth year] [name] (82 y.o. F) Primary Care Barth Trella: Andrea [name] Other Clinician: Referring Nathasha Fiorillo: Treating Taqwa Deem/Extender: Andrea [name] in Treatment: 35 Wound Status Wound Number: 6 Primary Skin Tear Etiology: Wound Location: Right, Distal, Anterior Lower Leg Wound Open Wounding Event: Skin Tear/Laceration Status: Date Acquired: 07/16/2022 Comorbid Cataracts, Arrhythmia, Hypertension, Osteoarthritis, Weeks Of Treatment: 1 History: Osteomyelitis, Neuropathy, Confinement  Anxiety Clustered Wound: No Photos Wound Measurements Length: (cm) Width: (cm) Depth: (cm) Area: (cm) Volume: (cm) 0 % Reduction in Area: 100% 0 % Reduction in Volume: 100% 0 Epithelialization: Large (67-100%) 0 Tunneling: No 0 Undermining: No Wound Description Classification: Full Thickness Without Exposed Support Structures Exudate Amount: None Present Foul Odor After Cleansing: No Slough/Fibrino Yes Wound Bed Andrea [name], CICHY [name] ([id number]) [id number] Page 14 of 14 Granulation Amount: None Present (0%) Exposed Structure Necrotic Amount: None Present (0%) Fascia Exposed: No Fat Layer (Subcutaneous Tissue) Exposed: No Tendon Exposed: No Muscle Exposed: No Joint Exposed: No Bone Exposed: No Periwound Skin Texture Texture Color No Abnormalities Noted: Yes No Abnormalities Noted: Yes Moisture Temperature / Pain No Abnormalities Noted: Yes Temperature: No Abnormality Electronic Signature(s) Signed: 08/02/2022 3:58:16 PM By: Dayton Scrape Entered By: Dayton Scrape on 08/02/2022 13:52:45 -------------------------------------------------------------------------------- Vitals Details Patient Name: Date of Service: Andrea [name] 08/02/2022 1:15 PM Medical Record Number: [id number] Patient Account Number: [id number] Date of Birth/Sex: Treating RN: [name] (81 y.o. F) Primary Care Lux Meaders: Andrea [name] Other Clinician: Referring Vance Hochmuth: Treating Elisabeth Strom/Extender: Andrea [name] in Treatment: 35 Vital Signs Time Taken: 01:21 Temperature (F): 98.8 Height (in): 59 Pulse (bpm): 89 Source: Stated Respiratory Rate (breaths/min): 18 Weight (lbs): 155 Blood Pressure (mmHg): 143/90 Source: Stated Reference Range: 80 - 120 mg / dl Body Mass Index (BMI): 31.3 Electronic Signature(s) Signed: 08/02/2022 5:51:31 PM By: Andrea Deed RN, BSN Entered By: Andrea [name] on 08/02/2022 13:42:36

## 2022-08-10 ENCOUNTER — Encounter (HOSPITAL_BASED_OUTPATIENT_CLINIC_OR_DEPARTMENT_OTHER): Payer: Medicare Other | Admitting: General Surgery

## 2022-08-10 DIAGNOSIS — L97812 Non-pressure chronic ulcer of other part of right lower leg with fat layer exposed: Secondary | ICD-10-CM | POA: Diagnosis not present

## 2022-08-10 NOTE — Progress Notes (Signed)
VANDELLA, ORD A ([id number]) [id number] Page 1 of 10 Visit Report for 08/10/2022 Arrival Information Details Patient Name: Date of Service: Andrea Yu 08/10/2022 2:15 PM Medical Record Number: [id number] Patient Account Number: [id number] Date of Birth/Sex: Treating RN: 09/23/1940 (82 y.o. [name] Primary Care Tonya Carlile: Jarome Matin Other Clinician: Referring Ijanae Macapagal: Treating Yousra Ivens/Extender: Marena Chancy in Treatment: 37 Visit Information History Since Last Visit Added or deleted any medications: No Patient Arrived: Wheel Chair Any new allergies or adverse reactions: No Arrival Time: 14:15 Had a fall or experienced change in No Accompanied By: daughter activities of daily living that may affect Transfer Assistance: Manual risk of falls: Patient Identification Verified: Yes Signs or symptoms of abuse/neglect since last visito No Secondary Verification Process Completed: Yes Hospitalized since last visit: No Patient Requires Transmission-Based Precautions: No Implantable device outside of the clinic excluding No Patient Has Alerts: No cellular tissue based products placed in the center since last visit: Has Dressing in Place as Prescribed: Yes Has Compression in Place as Prescribed: No Pain Present Now: No Electronic Signature(s) Signed: 08/10/2022 3:54:00 PM By: Samuella Bruin Entered By: Samuella Bruin on 08/10/2022 14:20:27 -------------------------------------------------------------------------------- Encounter Discharge Information Details Patient Name: Date of Service: Andrea Yu 08/10/2022 2:15 PM Medical Record Number: [id number] Patient Account Number: [id number] Date of Birth/Sex: Treating RN: Feb 16, 1940 (81 y.o. [name] Primary Care Artavious Trebilcock: Jarome Matin Other Clinician: Referring Nancee Brownrigg: Treating Sherle Mello/Extender: Marena Chancy in Treatment: 37 Encounter Discharge Information Items Post Procedure Vitals Discharge Condition: Stable Temperature (F): 97.7 Ambulatory Status: Wheelchair Pulse (bpm): 100 Discharge Destination: Home Respiratory Rate (breaths/min): 18 Transportation: Private Auto Blood Pressure (mmHg): 140/76 Accompanied By: self Schedule Follow-up Appointment: Yes Clinical Summary of Care: Patient Declined Electronic Signature(s) Signed: 08/10/2022 3:54:00 PM By: Samuella Bruin Entered By: Samuella Bruin on 08/10/2022 15:44:00 [name] ([id number]) [id number] Page 2 of 10 -------------------------------------------------------------------------------- Lower Extremity Assessment Details Patient Name: Date of Service: Andrea Yu 08/10/2022 2:15 PM Medical Record Number: [id number] Patient Account Number: [id number] Date of Birth/Sex: Treating RN: Apr 11, 1940 (82 y.o. [name] Primary Care Jaiyanna Safran: Jarome Matin Other Clinician: Referring Cassidey Barrales: Treating Jazzalynn Rhudy/Extender: Marena Chancy in Treatment: 37 Edema Assessment Assessed: Kyra Searles: No] Franne Forts: No] Edema: [Left: No] [Right: No] Calf Left: Right: Point of Measurement: From Medial Instep 31 cm 32 cm Ankle Left: Right: Point of Measurement: From Medial Instep 18 cm 21 cm Electronic Signature(s) Signed: 08/10/2022 3:54:00 PM By: Samuella Bruin Entered By: Samuella Bruin on 08/10/2022 14:15:52 -------------------------------------------------------------------------------- Multi Wound Chart Details Patient Name: Date of Service: Andrea Yu 08/10/2022 2:15 PM Medical Record Number: [id number] Patient Account Number: [id number] Date of Birth/Sex: Treating RN: 1940/09/05 (81 y.o. F) Primary Care Wylene Weissman: Jarome Matin Other Clinician: Referring Kamiah Fite: Treating Henli Hey/Extender: Marena Chancy in Treatment: 37 Vital Signs Height(in): 59 Pulse(bpm): 100 Weight(lbs): 155 Blood Pressure(mmHg): 140/76 Body Mass Index(BMI): 31.3 Temperature(F): 97.7 Respiratory Rate(breaths/min): 18 [1:Photos:] Right, Medial Malleolus Right Gluteus Left, Lateral Ankle Wound Location: Pressure Injury Bump Pressure Injury Wounding Event: Pressure Ulcer Abscess Pressure Ulcer Primary Etiology: Cataracts, Arrhythmia, Hypertension, Cataracts, Arrhythmia, Hypertension, Cataracts, Arrhythmia, Hypertension, Comorbid History: Osteoarthritis, Osteomyelitis, Osteoarthritis, Osteomyelitis, Osteoarthritis, Osteomyelitis, Neuropathy, Confinement Anxiety Neuropathy, Confinement Anxiety Neuropathy, Confinement Anxiety AOLANI, PIGGOTT A ([id number]) [id number] Page 3 of 10 11/16/2021 09/28/2021 12/23/2021 Date Acquired: 37 37 31 Weeks of Treatment: Open Open Open Wound Status: No No No Wound Recurrence: 1x0.7x0.1 2x1.2x3 0.8x0.6x0.2 Measurements  L x W x D (cm) 0.55 1.885 0.377 A (cm) : rea 0.055 5.655 0.075 Volume (cm) : 64.30% 82.00% -167.40% % Reduction in Area: 64.30% 82.00% -435.70% % Reduction in Volume: Category/Stage III Full Thickness With Exposed Support Category/Stage IV Classification: Structures Medium Medium Medium Exudate A mount: Serosanguineous Serous Serosanguineous Exudate Type: red, brown amber red, brown Exudate Color: Distinct, outline attached Well defined, not attached Distinct, outline attached Wound Margin: Large (67-100%) Large (67-100%) Small (1-33%) Granulation A mount: Red, Pink Pink, Pale Red Granulation Quality: Small (1-33%) Small (1-33%) Large (67-100%) Necrotic A mount: Fat Layer (Subcutaneous Tissue): Yes Fat Layer (Subcutaneous Tissue): Yes Fat Layer (Subcutaneous Tissue): Yes Exposed Structures: Fascia: No Fascia: No Tendon: Yes Tendon: No Tendon: No Fascia: No Muscle: No Muscle: No Muscle: No Joint:  No Joint: No Joint: No Bone: No Bone: No Bone: No Small (1-33%) None Small (1-33%) Epithelialization: Debridement - Selective/Open Wound N/A Debridement - Selective/Open Wound Debridement: Pre-procedure Verification/Time Out 14:50 N/A 14:50 Taken: Lidocaine 4% Topical Solution N/A Lidocaine 4% Topical Solution Pain Control: Necrotic/Eschar, Slough N/A Necrotic/Eschar, Slough Tissue Debrided: Non-Viable Tissue N/A Non-Viable Tissue Level: 0.55 N/A 0.38 Debridement A (sq cm): rea Curette N/A Curette Instrument: Minimum N/A Minimum Bleeding: Pressure N/A Pressure Hemostasis A chieved: Procedure was tolerated well N/A Procedure was tolerated well Debridement Treatment Response: 1x0.7x0.1 N/A 0.8x0.6x0.2 Post Debridement Measurements L x W x D (cm) 0.055 N/A 0.075 Post Debridement Volume: (cm) Category/Stage III N/A Category/Stage IV Post Debridement Stage: No Abnormalities Noted No Abnormalities Noted No Abnormalities Noted Periwound Skin Texture: No Abnormalities Noted No Abnormalities Noted No Abnormalities Noted Periwound Skin Moisture: Erythema: Yes Erythema: No Erythema: No Periwound Skin Color: Rubor: No Measured: 2.5cm N/A N/A Erythema Measurement: Increased N/A N/A Erythema Change: No Abnormality No Abnormality No Abnormality Temperature: N/A N/A Yes Tenderness on Palpation: Cellular or Tissue Based Product N/A Cellular or Tissue Based Product Procedures Performed: Debridement Debridement Treatment Notes Electronic Signature(s) Signed: 08/10/2022 3:15:18 PM By: Duanne Guess MD FACS Entered By: Duanne Guess on 08/10/2022 15:15:18 -------------------------------------------------------------------------------- Multi-Disciplinary Care Plan Details Patient Name: Date of Service: Andrea Yu. 08/10/2022 2:15 PM Medical Record Number: [id number] Patient Account Number: [id number] Date of Birth/Sex: Treating RN: [birth year] (81 y.o. [name] Primary Care Raeqwon Lux: Jarome Matin Other Clinician: Referring Jameka Ivie: Treating Sally Menard/Extender: Marena Chancy in Treatment: 37 Multidisciplinary Care Plan reviewed with physician [address] MARYORI, WEIDE A ([id number]) [id number] Page 4 of 10 Pressure Nursing Diagnoses: Knowledge deficit related to causes and risk factors for pressure ulcer development Knowledge deficit related to management of pressures ulcers Potential for impaired tissue integrity related to pressure, friction, moisture, and shear Goals: Patient/caregiver will verbalize understanding of pressure ulcer management Date Initiated: 11/24/2021 Target Resolution Date: 09/24/2022 Goal Status: Active Interventions: Assess: immobility, friction, shearing, incontinence upon admission and as needed Assess offloading mechanisms upon admission and as needed Assess potential for pressure ulcer upon admission and as needed Provide education on pressure ulcers Treatment Activities: Pressure reduction/relief device ordered : 11/24/2021 Notes: Wound/Skin Impairment Nursing Diagnoses: Impaired tissue integrity Knowledge deficit related to ulceration/compromised skin integrity Goals: Patient/caregiver will verbalize understanding of skin care regimen Date Initiated: 11/24/2021 Target Resolution Date: 09/24/2022 Goal Status: Active Ulcer/skin breakdown will have a volume reduction of 30% by week 4 Date Initiated: 11/24/2021 Date Inactivated: 05/31/2022 Target Resolution Date: 05/25/2022 Unmet Reason: refuses VAC, Goal Status: Unmet noncompliant with offloading Interventions: Assess patient/caregiver ability to obtain necessary supplies Assess patient/caregiver ability to perform ulcer/skin care  regimen upon admission and as needed Assess ulceration(s) every visit Provide education on ulcer and skin care Treatment Activities: Skin care  regimen initiated : 11/24/2021 Topical wound management initiated : 11/24/2021 Notes: Electronic Signature(s) Signed: 08/10/2022 3:54:00 PM By: Samuella Bruin Entered By: Samuella Bruin on 08/10/2022 14:57:32 -------------------------------------------------------------------------------- Pain Assessment Details Patient Name: Date of Service: Andrea Yu 08/10/2022 2:15 PM Medical Record Number: [id number] Patient Account Number: [id number] Date of Birth/Sex: Treating RN: Mar 24, 1940 (82 y.o. [name] Primary Care Katha Kuehne: Jarome Matin Other Clinician: Referring Dyllon Henken: Treating Latrese Carolan/Extender: Marena Chancy in Treatment: 37 Active Problems Location of Pain Severity and Description of Pain Patient Has Paino No Site Locations JISSELLE, POTH A ([id number]) [id number] Page 5 of 10 Site Locations Rate the pain. Current Pain Level: 0 Pain Management and Medication Current Pain Management: Electronic Signature(s) Signed: 08/10/2022 3:54:00 PM By: Samuella Bruin Entered By: Samuella Bruin on 08/10/2022 14:21:07 -------------------------------------------------------------------------------- Patient/Caregiver Education Details Patient Name: Date of Service: Andrea Yu 7/16/2024andnbsp2:15 PM Medical Record Number: [id number] Patient Account Number: [id number] Date of Birth/Gender: Treating RN: 1940-10-02 (82 y.o. [name] Primary Care Physician: Jarome Matin Other Clinician: Referring Physician: Treating Physician/Extender: Marena Chancy in Treatment: 37 Education Assessment Education Provided To: Patient Education Topics Provided Wound/Skin Impairment: Methods: Explain/Verbal Responses: Reinforcements needed, State content correctly Electronic Signature(s) Signed: 08/10/2022 3:54:00 PM By: Samuella Bruin Entered By: Samuella Bruin on 08/10/2022 14:57:44 -------------------------------------------------------------------------------- Wound Assessment Details Patient Name: Date of Service: Andrea Yu 08/10/2022 2:15 PM Medical Record Number: [id number] Patient Account Number: [id number] Date of Birth/Sex: Treating RN: [birth year] (82 y.o. [name] Primary Care Renetta Suman: Jarome Matin Other Clinician: [name] ([id number]) [id number] Page 6 of 10 Referring Smrithi Pigford: Treating Kevan Prouty/Extender: Marena Chancy in Treatment: 37 Wound Status Wound Number: 1 Primary Pressure Ulcer Etiology: Wound Location: Right, Medial Malleolus Wound Open Wounding Event: Pressure Injury Status: Date Acquired: 11/16/2021 Comorbid Cataracts, Arrhythmia, Hypertension, Osteoarthritis, Weeks Of Treatment: 37 History: Osteomyelitis, Neuropathy, Confinement Anxiety Clustered Wound: No Photos Wound Measurements Length: (cm) 1 Width: (cm) 0.7 Depth: (cm) 0.1 Area: (cm) 0.55 Volume: (cm) 0.055 % Reduction in Area: 64.3% % Reduction in Volume: 64.3% Epithelialization: Small (1-33%) Tunneling: No Undermining: No Wound Description Classification: Category/Stage III Wound Margin: Distinct, outline attached Exudate Amount: Medium Exudate Type: Serosanguineous Exudate Color: red, brown Foul Odor After Cleansing: No Slough/Fibrino Yes Wound Bed Granulation Amount: Large (67-100%) Exposed Structure Granulation Quality: Red, Pink Fascia Exposed: No Necrotic Amount: Small (1-33%) Fat Layer (Subcutaneous Tissue) Exposed: Yes Necrotic Quality: Adherent Slough Tendon Exposed: No Muscle Exposed: No Joint Exposed: No Bone Exposed: No Periwound Skin Texture Texture Color No Abnormalities Noted: Yes No Abnormalities Noted: No Erythema: Yes Moisture Erythema Measurement: Measured No Abnormalities Noted: Yes 2.5 cm Erythema Change:  Increased Temperature / Pain Temperature: No Abnormality Treatment Notes Wound #1 (Malleolus) Wound Laterality: Right, Medial Cleanser Normal Saline Discharge Instruction: Cleanse the wound with Normal Saline prior to applying a clean dressing using gauze sponges, not tissue or cotton balls. Byram Ancillary Kit - 15 Day Supply Discharge Instruction: Use supplies as instructed; Kit contains: (15) Saline Bullets; (15) 3x3 Gauze; 15 pr Gloves Peri-Wound Care Skin Prep Discharge Instruction: Use skin prep as directed TARAJI, MUNGO A ([id number]) [phone].pdf Page 7 of 10 Topical Primary Dressing Cutimed Sorbact Swab Discharge Instruction: Apply to wound bed as instructed Oasis tri-layer Secondary Dressing Secured With [employer], 4.5x3.1 (in/yd) Discharge Instruction: Secure  with Kerlix as directed. 15M Medipore Soft Cloth Surgical T 2x10 (in/yd) ape Discharge Instruction: Secure with tape as directed. Compression Wrap Compression Stockings Add-Ons Electronic Signature(s) Signed: 08/10/2022 3:54:00 PM By: Samuella Bruin Signed: 08/10/2022 4:26:02 PM By: Dayton Scrape Entered By: Dayton Scrape on 08/10/2022 14:30:05 -------------------------------------------------------------------------------- Wound Assessment Details Patient Name: Date of Service: Andrea Yu 08/10/2022 2:15 PM Medical Record Number: [id number] Patient Account Number: [id number] Date of Birth/Sex: Treating RN: [birth year] (81 y.o. [name] Primary Care Wilkes Potvin: Jarome Matin Other Clinician: Referring Shawnetta Lein: Treating Arian Murley/Extender: Marena Chancy in Treatment: 37 Wound Status Wound Number: 2 Primary Abscess Etiology: Wound Location: Right Gluteus Wound Open Wounding Event: Bump Status: Date Acquired: 09/28/2021 Comorbid Cataracts, Arrhythmia, Hypertension, Osteoarthritis, Weeks Of Treatment: 37 History: Osteomyelitis,  Neuropathy, Confinement Anxiety Clustered Wound: No Photos Wound Measurements Length: (cm) 2 Width: (cm) 1.2 Depth: (cm) 3 Area: (cm) 1.885 Volume: (cm) 5.655 % Reduction in Area: 82% % Reduction in Volume: 82% Epithelialization: None Wound Description Classification: Full Thickness With Exposed Support Structures Wound Margin: Well defined, not attached Loberg, Kobie A ([id number]) Exudate Amount: Medium Exudate Type: Serous Exudate Color: amber Foul Odor After Cleansing: No Slough/Fibrino Yes [id number] Page 8 of 10 Wound Bed Granulation Amount: Large (67-100%) Exposed Structure Granulation Quality: Pink, Pale Fascia Exposed: No Necrotic Amount: Small (1-33%) Fat Layer (Subcutaneous Tissue) Exposed: Yes Necrotic Quality: Adherent Slough Tendon Exposed: No Muscle Exposed: No Joint Exposed: No Bone Exposed: No Periwound Skin Texture Texture Color No Abnormalities Noted: Yes No Abnormalities Noted: Yes Moisture Temperature / Pain No Abnormalities Noted: Yes Temperature: No Abnormality Treatment Notes Wound #2 (Gluteus) Wound Laterality: Right Cleanser Normal Saline Discharge Instruction: Cleanse the wound with Normal Saline prior to applying a clean dressing using gauze sponges, not tissue or cotton balls. Peri-Wound Care Skin Prep Discharge Instruction: Use skin prep as directed Topical Primary Dressing Dakin's Solution 0.25%, 16 (oz) Discharge Instruction: Moisten gauze with Dakin's solution Promogran Prisma Matrix, 4.34 (sq in) (silver collagen) Discharge Instruction: Moisten collagen with saline or hydrogel cotton tipped applicators Discharge Instruction: use to pack wound Secondary Dressing Zetuvit Plus Silicone Border Dressing 5x5 (in/in) Discharge Instruction: Apply silicone border over primary dressing as directed. Secured With 15M Medipore H Soft Cloth Surgical T ape, 4 x 10 (in/yd) Discharge Instruction: Secure with tape as  directed. Compression Wrap Compression Stockings Add-Ons Electronic Signature(s) Signed: 08/10/2022 3:54:00 PM By: Samuella Bruin Signed: 08/10/2022 4:26:02 PM By: Dayton Scrape Entered By: Dayton Scrape on 08/10/2022 14:29:00 -------------------------------------------------------------------------------- Wound Assessment Details Patient Name: Date of Service: Andrea Yu 08/10/2022 2:15 PM Medical Record Number: [id number] Patient Account Number: [id number] Date of Birth/Sex: Treating RN: 07-27-1940 (81 y.o. [name] Primary Care Cherica Heiden: Jarome Matin Other Clinician: [name] ([id number]) [id number] Page 9 of 10 Referring Akanksha Bellmore: Treating Alfonso Shackett/Extender: Marena Chancy in Treatment: 37 Wound Status Wound Number: 4 Primary Pressure Ulcer Etiology: Wound Location: Left, Lateral Ankle Wound Open Wounding Event: Pressure Injury Status: Date Acquired: 12/23/2021 Comorbid Cataracts, Arrhythmia, Hypertension, Osteoarthritis, Weeks Of Treatment: 31 History: Osteomyelitis, Neuropathy, Confinement Anxiety Clustered Wound: No Photos Wound Measurements Length: (cm) 0.8 Width: (cm) 0.6 Depth: (cm) 0.2 Area: (cm) 0.377 Volume: (cm) 0.075 % Reduction in Area: -167.4% % Reduction in Volume: -435.7% Epithelialization: Small (1-33%) Tunneling: No Undermining: No Wound Description Classification: Category/Stage IV Wound Margin: Distinct, outline attached Exudate Amount: Medium Exudate Type: Serosanguineous Exudate Color: red, brown Foul Odor After Cleansing: No Slough/Fibrino Yes Wound Bed Granulation  Amount: Small (1-33%) Exposed Structure Granulation Quality: Red Fascia Exposed: No Necrotic Amount: Large (67-100%) Fat Layer (Subcutaneous Tissue) Exposed: Yes Necrotic Quality: Adherent Slough Tendon Exposed: Yes Muscle Exposed: No Joint Exposed: No Bone Exposed: No Periwound Skin  Texture Texture Color No Abnormalities Noted: Yes No Abnormalities Noted: Yes Moisture Temperature / Pain No Abnormalities Noted: Yes Temperature: No Abnormality Tenderness on Palpation: Yes Treatment Notes Wound #4 (Ankle) Wound Laterality: Left, Lateral Cleanser Normal Saline Discharge Instruction: Cleanse the wound with Normal Saline prior to applying a clean dressing using gauze sponges, not tissue or cotton balls. Byram Ancillary Kit - 15 Day Supply Discharge Instruction: Use supplies as instructed; Kit contains: (15) Saline Bullets; (15) 3x3 Gauze; 15 pr Gloves Peri-Wound Care Skin Prep Discharge Instruction: Use skin prep as directed Topical Primary Dressing Cutimed Sorbact Swab NOVALI, VOLLMAN A ([id number]) [id number] Page 10 of 10 Discharge Instruction: Apply to wound bed as instructed Oasis tri-layer Secondary Dressing ABD Pad, 5x9 Discharge Instruction: Apply over primary dressing as directed. Secured With [employer], 4.5x3.1 (in/yd) Discharge Instruction: Secure with Kerlix as directed. 17M Medipore Soft Cloth Surgical T 2x10 (in/yd) ape Discharge Instruction: Secure with tape as directed. Compression Wrap Compression Stockings Add-Ons Electronic Signature(s) Signed: 08/10/2022 3:54:00 PM By: Samuella Bruin Signed: 08/10/2022 4:26:02 PM By: Dayton Scrape Entered By: Dayton Scrape on 08/10/2022 14:30:41 -------------------------------------------------------------------------------- Vitals Details Patient Name: Date of Service: Andrea Yu 08/10/2022 2:15 PM Medical Record Number: [id number] Patient Account Number: [id number] Date of Birth/Sex: Treating RN: 22-Sep-1940 (81 y.o. [name] Primary Care Kamarie Veno: Jarome Matin Other Clinician: Referring Rashawd Laskaris: Treating Faysal Fenoglio/Extender: Marena Chancy in Treatment: 37 Vital Signs Time Taken: 02:20 Temperature (F): 97.7 Height  (in): 59 Pulse (bpm): 100 Weight (lbs): 155 Respiratory Rate (breaths/min): 18 Body Mass Index (BMI): 31.3 Blood Pressure (mmHg): 140/76 Reference Range: 80 - 120 mg / dl Electronic Signature(s) Signed: 08/10/2022 3:54:00 PM By: Samuella Bruin Entered By: Samuella Bruin on 08/10/2022 14:21:02

## 2022-08-10 NOTE — Progress Notes (Addendum)
Andrea Yu, MALICK Yu ([id number]) [id number] Page 1 of 15 Visit Report for 08/10/2022 Chief Complaint Document Details Patient Name: Date of Service: Andrea Yu 08/10/2022 2:15 PM Medical Record Number: [id number] Patient Account Number: [id number] Date of Birth/Sex: Treating RN: 1940-05-18 (82 y.o. F) Primary Care Provider: Jarome Matin Other Clinician: Referring Provider: Treating Provider/Extender: Marena Chancy in Treatment: 37 Information Obtained from: Patient Chief Complaint Patient is at the clinic for treatment of an open pressure ulcer on her right medial ankle, and Yu large abscess on her right buttock Electronic Signature(s) Signed: 08/10/2022 3:15:31 PM By: Duanne Guess MD FACS Entered By: Duanne Guess on 08/10/2022 15:15:31 -------------------------------------------------------------------------------- Cellular or Tissue Based Product Details Patient Name: Date of Service: Andrea Yu 08/10/2022 2:15 PM Medical Record Number: [id number] Patient Account Number: [id number] Date of Birth/Sex: Treating RN: 1940-08-17 (81 y.o. Andrea Yu Primary Care Provider: Jarome Matin Other Clinician: Referring Provider: Treating Provider/Extender: Marena Chancy in Treatment: 37 Cellular or Tissue Based Product Type Wound #1 Right,Medial Malleolus Applied to: Performed By: Physician Duanne Guess, MD Cellular or Tissue Based Product Type: Oasis wound matrix Level of Consciousness (Pre-procedure): Awake and Alert Pre-procedure Verification/Time Out Yes - 14:51 Taken: Location: genitalia / hands / feet / multiple digits Wound Size (sq cm): 0.7 Product Size (sq cm): 3 Waste Size (sq cm): 0 Amount of Product Applied (sq cm): 3 Instrument Used: Forceps, Scissors Lot #: [id number] Expiration Date: 12/28/2022 Fenestrated: No Reconstituted: Yes Solution Type: normal  saline Solution Amount: 4 ml Lot #: [id number] KS Solution Expiration Date: 02/16/2024 Secured: Yes Secured With: Steri-Strips Dressing Applied: Yes Primary Dressing: sorbact Procedural Pain: 0 Post Procedural Pain: 0 Response to Treatment: Procedure was tolerated well Level of Consciousness (Post- Awake and Alert procedure): TENNIA, SCHNEPPER Yu ([id number]) [id number] Page 2 of 15 Post Procedure Diagnosis Same as Pre-procedure Notes scribed for Dr. Lady Gary by Samuella Bruin, RN Electronic Signature(s) Signed: 08/10/2022 3:28:51 PM By: Duanne Guess MD FACS Signed: 08/10/2022 3:54:00 PM By: Gelene Mink By: Samuella Bruin on 08/10/2022 14:56:56 -------------------------------------------------------------------------------- Cellular or Tissue Based Product Details Patient Name: Date of Service: Andrea Yu 08/10/2022 2:15 PM Medical Record Number: [id number] Patient Account Number: [id number] Date of Birth/Sex: Treating RN: 1940-02-14 (81 y.o. Andrea Yu Primary Care Provider: Jarome Matin Other Clinician: Referring Provider: Treating Provider/Extender: Marena Chancy in Treatment: 37 Cellular or Tissue Based Product Type Wound #4 Left,Lateral Ankle Applied to: Performed By: Physician Duanne Guess, MD Cellular or Tissue Based Product Type: Oasis wound matrix Level of Consciousness (Pre-procedure): Awake and Alert Pre-procedure Verification/Time Out Yes - 14:51 Taken: Location: genitalia / hands / feet / multiple digits Wound Size (sq cm): 0.48 Product Size (sq cm): 3 Waste Size (sq cm): 0 Amount of Product Applied (sq cm): 3 Instrument Used: Forceps, Scissors Lot #: [id number] Expiration Date: 12/28/2022 Fenestrated: No Reconstituted: Yes Solution Type: normal saline Solution Amount: 4 ml Lot #: [id number] KS Solution Expiration Date: 02/16/2024 Secured: Yes Secured With:  Steri-Strips Dressing Applied: Yes Primary Dressing: sorbact Procedural Pain: 0 Post Procedural Pain: 0 Response to Treatment: Procedure was tolerated well Level of Consciousness (Post- Awake and Alert procedure): Post Procedure Diagnosis Same as Pre-procedure Notes scribed for Dr. Lady Gary by Samuella Bruin, RN Electronic Signature(s) Signed: 08/10/2022 3:28:51 PM By: Duanne Guess MD FACS Signed: 08/10/2022 3:54:00 PM By: Samuella Bruin Entered By: Samuella Bruin on 08/10/2022 14:57:15 Andrea Yu ([id number]) [id number] Page 3 of 15 --------------------------------------------------------------------------------  Debridement Details Patient Name: Date of Service: Andrea Yu 08/10/2022 2:15 PM Medical Record Number: [id number] Patient Account Number: [id number] Date of Birth/Sex: Treating RN: 27-May-1940 (82 y.o. Andrea Yu Primary Care Provider: Jarome Matin Other Clinician: Referring Provider: Treating Provider/Extender: Marena Chancy in Treatment: 37 Debridement Performed for Assessment: Wound #1 Right,Medial Malleolus Performed By: Physician Duanne Guess, MD Debridement Type: Debridement Level of Consciousness (Pre-procedure): Awake and Alert Pre-procedure Verification/Time Out Yes - 14:50 Taken: Start Time: 14:50 Pain Control: Lidocaine 4% Topical Solution Percent of Wound Bed Debrided: 100% T Area Debrided (cm): otal 0.55 Tissue and other material debrided: Non-Viable, Eschar, Slough, Slough Level: Non-Viable Tissue Debridement Description: Selective/Open Wound Instrument: Curette Bleeding: Minimum Hemostasis Achieved: Pressure Response to Treatment: Procedure was tolerated well Level of Consciousness (Post- Awake and Alert procedure): Post Debridement Measurements of Total Wound Length: (cm) 1 Stage: Category/Stage III Width: (cm) 0.7 Depth: (cm) 0.1 Volume: (cm)  0.055 Character of Wound/Ulcer Post Debridement: Improved Post Procedure Diagnosis Same as Pre-procedure Notes scribed for Dr. Lady Gary by Samuella Bruin, RN Electronic Signature(s) Signed: 08/10/2022 3:28:51 PM By: Duanne Guess MD FACS Signed: 08/10/2022 3:54:00 PM By: Samuella Bruin Entered By: Samuella Bruin on 08/10/2022 14:50:57 -------------------------------------------------------------------------------- Debridement Details Patient Name: Date of Service: [name] 08/10/2022 2:15 PM Medical Record Number: [id number] Patient Account Number: [id number] Date of Birth/Sex: Treating RN: 07/25/1940 (81 y.o. Andrea Yu Primary Care Provider: Jarome Matin Other Clinician: Referring Provider: Treating Provider/Extender: Marena Chancy in Treatment: 37 Debridement Performed for Assessment: Wound #4 Left,Lateral Ankle Performed By: Physician Duanne Guess, MD Debridement Type: Debridement Level of Consciousness (Pre-procedure): Awake and Alert Pre-procedure Verification/Time Out Yes - 14:50 Taken: Start Time: 14:50 SHAREL, HOSTY ([id number]) [id number] Page 4 of 15 Pain Control: Lidocaine 4% Topical Solution Percent of Wound Bed Debrided: 100% T Area Debrided (cm): otal 0.38 Tissue and other material debrided: Non-Viable, Eschar, Slough, Slough Level: Non-Viable Tissue Debridement Description: Selective/Open Wound Instrument: Curette Bleeding: Minimum Hemostasis Achieved: Pressure Response to Treatment: Procedure was tolerated well Level of Consciousness (Post- Awake and Alert procedure): Post Debridement Measurements of Total Wound Length: (cm) 0.8 Stage: Category/Stage IV Width: (cm) 0.6 Depth: (cm) 0.2 Volume: (cm) 0.075 Character of Wound/Ulcer Post Debridement: Improved Post Procedure Diagnosis Same as Pre-procedure Notes scribed for Dr. Lady Gary by Samuella Bruin,  RN Electronic Signature(s) Signed: 08/10/2022 3:28:51 PM By: Duanne Guess MD FACS Signed: 08/10/2022 3:54:00 PM By: Samuella Bruin Entered By: Samuella Bruin on 08/10/2022 14:51:17 -------------------------------------------------------------------------------- HPI Details Patient Name: Date of Service: [name] 08/10/2022 2:15 PM Medical Record Number: [id number] Patient Account Number: [id number] Date of Birth/Sex: Treating RN: 03-Jul-1940 (81 y.o. 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 She is not diabetic and quit smoking over 40 years ago. 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 She has developed Yu pressure ulcer on the right medial malleolus. In addition, she apparently has had multiple cutaneous abscesses on her buttocks that have required repeated incision and drainage procedures. She has developed Yu large abscess on her right buttock that has become painful. She was recently treated with cephalexin by her PCP for urinary tract infection and also received  Yu shot of Rocephin for the abscess, but it has not yet been incised or drained. She is nonambulatory but is able to stand to transfer. She does not wear any sort of Prevalon boot. ABI in clinic today was 0.96. On her right medial malleolus, there is Yu circular ulcer. The surface is dry and fibrotic. There is some periwound erythema, but no malodor or purulent drainage. On her right buttock, there is Yu large fluctuant bulge about the size of an orange. It is also erythematous and tender. 12/04/2021: The culture from her abscess grew out staph. Yu 10-day  course of Bactrim was prescribed and she is currently taking this. Unfortunately, Dakin's solution was not delivered and only 2 x 2 gauze was sent, rather than Kerlix so the packing of the wound has been rather suboptimal. The wound cavity has light slough over all of the surfaces. Although covered, bone is palpable. The medial ankle wound looks about the same with Yu layer of slough accumulation. In addition, her daughter peeled some dry skin off of her foot this morning and she now has denuded areas over the majority of the plantar surface of her right foot. 01/01/2022: The patient has missed several visits and to her daughters report that it is somewhat difficult to get her here on Yu weekly basis. In the interim, she has developed Yu new pressure ulcer on her left lateral malleolus. The plantar surface of her right foot has healed. The cavity on her right buttock has contracted, but it remains quite deep and the trochanter, while not exposed, is palpable at the base. The pressure ulcer on her right medial malleolus is basically unchanged. 02/09/2022: All wounds are little bit smaller today. As the buttock abscess cavity has contracted, it has left some tunneling that has not been adequately packed. Light slough accumulation on both ankle wounds. 02/26/2022: All of the wounds continue to contract. There is slough on both ankle wounds. The buttock ulcer is clean. She brought her wound VAC with her today. AIYANAH, RIZKALLA Yu ([id number]) [id number] Page 5 of 15 03/12/2022: The ankle wounds are about the same this week with slough accumulation. The buttock ulcer is smaller and quite clean. She decided that she could not tolerate the discomfort of the wound VAC and removed it. They have been packing the wound with Dakin's moistened gauze. 03/25/2022: The ankle wounds are slightly smaller. Both have slough accumulation. The orifice of the buttock ulcer has gotten quite Yu bit smaller than  the underlying cavity. She has decided that she would like to have the wound VAC back. 3/14; patient presents for follow-up. She has bilateral ankle wounds and has been using Hydrofera Blue to the these wound beds. She has Prevalon boots to help with offloading. She also has Yu sacral wound that we have been using Yu wound VAC for. She has no issues or complaints today. 04/29/2022: She once again decided she did not like the wound VAC and so they have been packing her gluteal wound with Dakin's moistened gauze. The gluteal ulcer seems to be about the same. Her left lateral ankle wound has gotten deeper and larger. The surface tissue is gray with thick slough. No significant change to the right medial ankle wound. 05/18/2022: The depth on the gluteal ulcer has come in Yu bit. There was Yu very strong pungent odor when the packing was removed, however. The right medial ankle wound is about the same size, but the tissue surface looks healthier. The left lateral ankle  wound has reaccumulated the rubbery gray slough. 05/31/2022: The gluteal ulcer is shallower and no longer has any dips or crevices. The odor has abated completely. The right medial ankle wound is unchanged. The left lateral ankle wound looks quite Yu bit healthier with Yu cleaner surface and the beginnings of granulation tissue emergence. 06/16/2022: The gluteal ulcer has the same measurements more or less, but overall the volume seems smaller, as the cavity feels tighter on examination. The ankle wounds are basically the same. 07/12/2022: The cavity of the buttock ulcer continues to contract. The left ankle wound has Yu little bit more granulation tissue, but bone remains exposed. The right ankle wound is flush with the surrounding skin. 07/21/2022: There is 1 deeper area in the buttock ulcer that I have not appreciated previously. I can feel bone under Yu layer of tissue. The rest of the cavity has contracted. Both ankle wounds are filling in with better  granulation tissue. There is slough on both ankle wound surfaces. She struck her leg on her bed frame and has 2 new wounds on her right anterior tibial surface. Both have fat layer exposure but no concern for infection. 08/02/2022: I do not feel bone as easily in the buttock ulcer. There is slough on the surface. The cavity seems to be contracting. Both ankle wounds have improved tissue quality and there is granulation tissue nearly completely covering the bone on the left lateral malleolus. The wounds on her right anterior tibial surface have dried up. 08/10/2022: The cavity at the buttock ulcer has contracted further. The depth remains about the same. The surface is cleaner this week. Both ankle wounds are little bit smaller today. They have rubbery slough on the surface which appears to be leftover Oasis. Underneath, the quality of the tissue continues to improve and there is no longer any bone exposed on the left lateral malleolus. Electronic Signature(s) Signed: 08/10/2022 3:16:34 PM By: Duanne Guess MD FACS Entered By: Duanne Guess on 08/10/2022 15:16:34 -------------------------------------------------------------------------------- Physical Exam Details Patient Name: Date of Service: Andrea Yu 08/10/2022 2:15 PM Medical Record Number: [id number] Patient Account Number: [id number] Date of Birth/Sex: Treating RN: 1940-07-29 (81 y.o. F) Primary Care Provider: Jarome Matin Other Clinician: Referring Provider: Treating Provider/Extender: Marena Chancy in Treatment: 37 Constitutional Slightly hypertensive. . . . no acute distress. Respiratory Normal work of breathing on room air. Notes 08/10/2022: The cavity at the buttock ulcer has contracted further. The depth remains about the same. The surface is cleaner this week. Both ankle wounds are little bit smaller today. They have rubbery slough on the surface which appears to be leftover Oasis.  Underneath, the quality of the tissue continues to improve and there is no longer any bone exposed on the left lateral malleolus. Electronic Signature(s) Signed: 08/10/2022 3:17:10 PM By: Duanne Guess MD FACS Entered By: Duanne Guess on 08/10/2022 15:17:10 Andrea Yu ([id number]) [id number] Page 6 of 15 -------------------------------------------------------------------------------- Physician Orders Details Patient Name: Date of Service: Andrea Yu 08/10/2022 2:15 PM Medical Record Number: [id number] Patient Account Number: [id number] Date of Birth/Sex: Treating RN: 1940-12-26 (82 y.o. 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 - Dr. Lady Gary - Room 1 Return Yu Anesthetic (In clinic) Topical Lidocaine 4% applied to wound bed Cellular or Tissue Based Products Cellular or Tissue Based Product Type: - oasis tri-layer #3 Oasis wound matrix 08/10/2022 #4 skin sub Cellular or Tissue Based Product applied to wound bed, secured with steri-strips, cover with Adaptic or Mepitel. (DO NOT REMOVE). Bathing/ Shower/ Hygiene May shower with protection but do not get wound dressing(s) wet. Protect dressing(s) with water repellant cover (for example,  large plastic bag) or Yu cast cover and may then take shower. Off-Loading Multipodus Splint to: - prevalon boot to both feet Turn and reposition every 2 hours Home Health No change in wound care orders this week; continue Home Health for wound care. May utilize formulary equivalent dressing for wound treatment orders unless otherwise specified. - may chage outer dressing only on ankles, DO NOT remove sterostrips Dressing changes to be completed by Home Health on Monday / Wednesday / Friday except when patient has scheduled visit at [facility]. Other Home Health Orders/Instructions: - [facility] Wound Treatment Wound #1 - Malleolus Wound Laterality: Right, Medial Cleanser: Normal Saline (Generic) 1 x Per Week/30 Days Discharge Instructions: Cleanse the wound with Normal Saline prior to applying Yu clean dressing using gauze sponges, not tissue or cotton balls. Cleanser: Byram Ancillary Kit - 15 Day Supply (Generic) 1 x Per Week/30 Days Discharge Instructions: Use supplies as instructed; Kit contains: (15) Saline Bullets; (15) 3x3 Gauze; 15 pr Gloves Peri-Wound Care: Skin Prep (Generic) 1 x Per Week/30 Days Discharge Instructions: Use skin prep as directed Prim Dressing: Cutimed Sorbact Swab 1 x Per Week/30 Days ary Discharge Instructions: Apply to wound bed as instructed Prim Dressing: Oasis tri-layer ary 1 x Per Week/30 Days Secured With: [employer], 4.5x3.1 (in/yd) 1 x Per Week/30 Days Discharge Instructions: Secure with Kerlix as directed. Secured With: 52M Medipore Scientist, research (life sciences) Surgical T 2x10 (in/yd) (Generic) 1 x Per Week/30 Days ape Discharge Instructions: Secure with tape as directed. Wound #2 - Gluteus Wound Laterality: Right Cleanser: Normal Saline (Generic) 1 x Per Day/30 Days Discharge Instructions: Cleanse the wound with Normal Saline prior to applying Yu clean dressing using gauze sponges, not tissue or cotton balls. WANNETTA, LUPFER Yu ([id number])  [id number] Page 7 of 15 Peri-Wound Care: Skin Prep (Generic) 1 x Per Day/30 Days Discharge Instructions: Use skin prep as directed Prim Dressing: Dakin's Solution 0.25%, 16 (oz) (Generic) 1 x Per Day/30 Days ary Discharge Instructions: Moisten gauze with Dakin's solution Prim Dressing: Promogran Prisma Matrix, 4.34 (sq in) (silver collagen) (Dispense As Written) 1 x Per Day/30 Days ary Discharge Instructions: Moisten collagen with saline or hydrogel Prim Dressing: cotton tipped applicators (Generic) 1 x Per Day/30 Days ary Discharge Instructions: use to pack wound Secondary Dressing: Zetuvit Plus Silicone Border Dressing 5x5 (in/in) (Generic) 1 x Per Day/30 Days Discharge Instructions: Apply silicone border over primary dressing as directed. Secured With: 52M Medipore H Soft Cloth Surgical T ape, 4 x 10 (in/yd) (Generic) 1 x Per Day/30 Days Discharge Instructions: Secure with tape as directed. Wound #4 - Ankle Wound Laterality: Left, Lateral Cleanser: Normal Saline (Generic) 1 x Per Week/30 Days Discharge Instructions: Cleanse the wound with Normal Saline prior to applying Yu clean dressing using gauze sponges,  not tissue or cotton balls. Cleanser: Byram Ancillary Kit - 15 Day Supply (Generic) 1 x Per Week/30 Days Discharge Instructions: Use supplies as instructed; Kit contains: (15) Saline Bullets; (15) 3x3 Gauze; 15 pr Gloves Peri-Wound Care: Skin Prep (Generic) 1 x Per Week/30 Days Discharge Instructions: Use skin prep as directed Prim Dressing: Cutimed Sorbact Swab 1 x Per Week/30 Days ary Discharge Instructions: Apply to wound bed as instructed Prim Dressing: Oasis tri-layer ary 1 x Per Week/30 Days Secondary Dressing: ABD Pad, 5x9 1 x Per Week/30 Days Discharge Instructions: Apply over primary dressing as directed. Secured With: [employer], 4.5x3.1 (in/yd) 1 x Per Week/30 Days Discharge Instructions: Secure with Kerlix as directed. Secured  With: 20M Medipore Scientist, research (life sciences) Surgical T 2x10 (in/yd) (Generic) 1 x Per Week/30 Days ape Discharge Instructions: Secure with tape as directed. Patient Medications llergies: penicillin, latex, Prolia, ceftriaxone, ciprofloxacin, Fosamax, lisinopril, tuberculin,PPD,multi-puncture Yu Notifications Medication Indication Start End 08/10/2022 lidocaine DOSE topical 4 % cream - cream topical Electronic Signature(s) Signed: 08/10/2022 3:28:51 PM By: Duanne Guess MD FACS Entered By: Duanne Guess on 08/10/2022 15:17:44 -------------------------------------------------------------------------------- Problem List Details Patient Name: Date of Service: Andrea Yu 08/10/2022 2:15 PM Medical Record Number: [id number] Patient Account Number: [id number] Date of Birth/Sex: Treating RN: Feb 14, 1940 (81 y.o. 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 without 01/01/2022 No Yes evidence of necrosis L97.812 Non-pressure chronic ulcer of other part of right lower leg with fat layer 07/21/2022 No Yes exposed L02.31 Cutaneous abscess of buttock 11/24/2021 No Yes E27.40 Unspecified adrenocortical insufficiency 11/24/2021 No Yes Z79.52 Long term (current) use of systemic steroids 11/24/2021 No Yes I10 Essential (primary) hypertension 11/24/2021 No Yes Inactive Problems Resolved Problems ICD-10 Code Description Active Date Resolved Date L97.511 Non-pressure chronic ulcer of other part of right foot limited to breakdown of skin  12/04/2021 12/04/2021 Electronic Signature(s) Signed: 08/10/2022 3:15:11 PM By: Duanne Guess MD FACS Entered By: Duanne Guess on 08/10/2022 15:15:10 -------------------------------------------------------------------------------- Progress Note Details Patient Name: Date of Service: Andrea Yu 08/10/2022 2:15 PM Medical Record Number: [id number] Patient Account Number: [id number] Date of Birth/Sex: Treating RN: May 09, 1940 (81 y.o. 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 She is not diabetic and quit smoking over 40 years ago. 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 She has developed Yu pressure ulcer on the right medial malleolus. In addition, she apparently has had multiple cutaneous abscesses on her buttocks that have required repeated incision and drainage procedures. She has developed Yu large abscess on her right buttock that has become painful. She was recently treated with cephalexin by her PCP for  urinary tract infection and also received Yu shot of Rocephin for the abscess, but it has not yet been incised or drained. She is nonambulatory but is able to stand to transfer. She does not wear any sort of Prevalon boot. ABI in clinic today was 0.96. On her right medial malleolus, there is Yu circular ulcer. The surface is dry and fibrotic. There is some  periwound erythema, but no malodor or purulent drainage. On her right buttock, there is Yu large fluctuant bulge about the size of an orange. It is also erythematous and tender. 12/04/2021: The culture from her abscess grew out staph. Yu 10-day course of Bactrim was prescribed and she is currently taking this. Unfortunately, Dakin's solution was not delivered and only 2 x 2 gauze was sent, rather than Kerlix so the packing of the wound has been rather suboptimal. The wound cavity has light slough over all of the surfaces. Although covered, bone is palpable. The medial ankle wound looks about the same with Yu layer of slough accumulation. In addition, her daughter peeled some dry skin off of her foot this morning and she now has denuded areas over the majority of the plantar surface of her right foot. 01/01/2022: The patient has missed several visits and to her daughters report that it is somewhat difficult to get her here on Yu weekly basis. In the interim, she has developed Yu new pressure ulcer on her left lateral malleolus. The plantar surface of her right foot has healed. The cavity on her right buttock has contracted, but it remains quite deep and the trochanter, while not exposed, is palpable at the base. The pressure ulcer on her right medial malleolus is basically unchanged. 02/09/2022: All wounds are little bit smaller today. As the buttock abscess cavity has contracted, it has left some tunneling that has not been adequately packed. Light slough accumulation on both ankle wounds. 02/26/2022: All of the wounds continue to contract. There is slough on both ankle wounds. The buttock ulcer is clean. She brought her wound VAC with her today. 03/12/2022: The ankle wounds are about the same this week with slough accumulation. The buttock ulcer is smaller and quite clean. She decided that she could not tolerate the discomfort of the wound VAC and removed it. They have been packing the wound with Dakin's  moistened gauze. 03/25/2022: The ankle wounds are slightly smaller. Both have slough accumulation. The orifice of the buttock ulcer has gotten quite Yu bit smaller than the underlying cavity. She has decided that she would like to have the wound VAC back. 3/14; patient presents for follow-up. She has bilateral ankle wounds and has been using Hydrofera Blue to the these wound beds. She has Prevalon boots to help with offloading. She also has Yu sacral wound that we have been using Yu wound VAC for. She has no issues or complaints today. 04/29/2022: She once again decided she did not like the wound VAC and so they have been packing her gluteal wound with Dakin's moistened gauze. The gluteal ulcer seems to be about the same. Her left lateral ankle wound has gotten deeper and larger. The surface tissue is gray with thick slough. No significant change to the right medial ankle wound. 05/18/2022: The depth on the gluteal ulcer has come in Yu bit. There was Yu very strong pungent odor when the packing was removed, however. The right medial ankle wound is about the same size, but the tissue surface looks healthier. The left lateral ankle wound has reaccumulated  the rubbery gray slough. 05/31/2022: The gluteal ulcer is shallower and no longer has any dips or crevices. The odor has abated completely. The right medial ankle wound is unchanged. The left lateral ankle wound looks quite Yu bit healthier with Yu cleaner surface and the beginnings of granulation tissue emergence. 06/16/2022: The gluteal ulcer has the same measurements more or less, but overall the volume seems smaller, as the cavity feels tighter on examination. The ankle wounds are basically the same. 07/12/2022: The cavity of the buttock ulcer continues to contract. The left ankle wound has Yu little bit more granulation tissue, but bone remains exposed. The right ankle wound is flush with the surrounding skin. 07/21/2022: There is 1 deeper area in the buttock  ulcer that I have not appreciated previously. I can feel bone under Yu layer of tissue. The rest of the cavity has contracted. Both ankle wounds are filling in with better granulation tissue. There is slough on both ankle wound surfaces. She struck her leg on her bed frame and has 2 new wounds on her right anterior tibial surface. Both have fat layer exposure but no concern for infection. 08/02/2022: I do not feel bone as easily in the buttock ulcer. There is slough on the surface. The cavity seems to be contracting. Both ankle wounds have improved tissue quality and there is granulation tissue nearly completely covering the bone on the left lateral malleolus. The wounds on her right anterior tibial surface have dried up. 08/10/2022: The cavity at the buttock ulcer has contracted further. The depth remains about the same. The surface is cleaner this week. Both ankle wounds are little bit smaller today. They have rubbery slough on the surface which appears to be leftover Oasis. Underneath, the quality of the tissue continues to improve and there is no longer any bone exposed on the left lateral malleolus. Patient History Information obtained from Patient, Caregiver, Chart. Family History Cancer - Father,Maternal Grandparents,Siblings,Child, Heart Disease - Father, Hypertension - Father, Thyroid Problems - Mother, No family history of Diabetes, Hereditary Spherocytosis, Kidney Disease, Lung Disease, Seizures, Stroke, Tuberculosis. Social History Former smoker - quit 40 yr ago, Marital Status - Widowed, Alcohol Use - Never, Drug Use - No History, Caffeine Use - Daily - tea, soda. Medical History Eyes Patient has history of Cataracts - bil extracted Denies history of Glaucoma, Optic Neuritis Cardiovascular Patient has history of Arrhythmia - afib, Hypertension Endocrine Denies history of Type I Diabetes, Type II Diabetes Genitourinary Denies history of End Stage Renal Disease Integumentary  (Skin) Denies history of History of Burn Musculoskeletal Patient has history of Osteoarthritis, Osteomyelitis - pelvic KYRSTYN, WECKERLY Yu ([id number]) [id number] Page 10 of 15 Neurologic Patient has history of Neuropathy Oncologic Denies history of Received Chemotherapy, Received Radiation Psychiatric Patient has history of Confinement Anxiety Denies history of Anorexia/bulimia Hospitalization/Surgery History - TEE. - right ankle fusion. - bil knee replacements. - bil cataract extractions. - posterior lumbar fusion. - vaginal hysterectomy. - cholecystectomy. 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 no acute distress. Vitals Time Taken: 2:20 AM, Height: 59 in, Weight: 155 lbs, BMI: 31.3, Temperature: 97.7 F, Pulse: 100 bpm, Respiratory Rate: 18 breaths/min, Blood Pressure: 140/76 mmHg. Respiratory Normal work of breathing on room air. General Notes: 08/10/2022: The cavity at the buttock ulcer has contracted further. The depth remains about  the same. The surface is cleaner this week. Both ankle wounds are little bit smaller today. They have rubbery slough on the surface which appears to be leftover Oasis. Underneath, the quality of the tissue continues to improve and there is no longer any bone exposed on the left lateral malleolus. Integumentary (Hair, Skin) Wound #1 status is Open. Original cause of wound was Pressure Injury. The date acquired was: 11/16/2021. The wound has been in treatment 37 weeks. The wound is located on the Right,Medial Malleolus. The wound measures 1cm length x 0.7cm width x 0.1cm depth; 0.55cm^2 area and 0.055cm^3 volume. There is Fat  Layer (Subcutaneous Tissue) exposed. There is no tunneling or undermining noted. There is Yu medium amount of serosanguineous drainage noted. The wound margin is distinct with the outline attached to the wound base. There is large (67-100%) red, pink granulation within the wound bed. There is Yu small (1- 33%) amount of necrotic tissue within the wound bed including Adherent Slough. The periwound skin appearance had no abnormalities noted for texture. The periwound skin appearance had no abnormalities noted for moisture. The periwound skin appearance exhibited: Erythema. The surrounding wound skin color is noted with erythema. Erythema is measured at 2.5 cm. Periwound temperature was noted as No Abnormality. Wound #2 status is Open. Original cause of wound was Bump. The date acquired was: 09/28/2021. The wound has been in treatment 37 weeks. The wound is located on the Right Gluteus. The wound measures 2cm length x 1.2cm width x 3cm depth; 1.885cm^2 area and 5.655cm^3 volume. There is Fat Layer (Subcutaneous Tissue) exposed. There is Yu medium amount of serous drainage noted. The wound margin is well defined and not attached to the wound base. There is large (67-100%) pink, pale granulation within the wound bed. There is Yu small (1-33%) amount of necrotic tissue within the wound bed including Adherent Slough. The periwound skin appearance had no abnormalities noted for texture. The periwound skin appearance had no abnormalities noted for moisture. The periwound skin appearance had no abnormalities noted for color. Periwound temperature was noted as No Abnormality. Wound #4 status is Open. Original cause of wound was Pressure Injury. The date acquired was: 12/23/2021. The wound has been in treatment 31 weeks. The wound is located on the Left,Lateral Ankle. The wound measures 0.8cm length x 0.6cm width x 0.2cm depth; 0.377cm^2 area and 0.075cm^3 volume. There is tendon and Fat Layer (Subcutaneous Tissue)  exposed. There is no tunneling or undermining noted. There is Yu medium amount of serosanguineous drainage noted. The wound margin is distinct with the outline attached to the wound base. There is small (1-33%) red granulation within the wound bed. There is Yu large (67-100%) amount of necrotic tissue within the wound bed including Adherent Slough. The periwound skin appearance had no abnormalities noted for texture. The periwound skin appearance had no abnormalities noted for moisture. The periwound skin appearance had no abnormalities noted for color. Periwound temperature was noted as No Abnormality. The periwound has tenderness on palpation. 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 There was Yu Selective/Open Wound Non-Viable Tissue Debridement with Yu total area of 0.55 sq cm performed by Duanne Guess, MD. With the following instrument(s): Curette to remove Non-Viable tissue/material. Material removed includes Eschar and Slough and after achieving pain control using Lidocaine 4% Topical Solution. No specimens were taken. Yu time out was conducted at 14:50, prior to the start of the procedure. Yu Minimum amount of bleeding was controlled with Pressure. The procedure was tolerated well. Post Debridement Measurements: 1cm length x 0.7cm width x 0.1cm depth; 0.055cm^3 volume. Post debridement Stage noted  as Category/Stage III. Character of Wound/Ulcer Post Debridement is improved. Post procedure Diagnosis Wound #1: Same as Pre-Procedure General Notes: scribed for Dr. Lady Gary by Samuella Bruin, RN. Pre-procedure diagnosis of Wound #1 is Yu Pressure Ulcer located on the Right,Medial Malleolus. Yu skin graft procedure using Yu bioengineered skin substitute/cellular or tissue based product was performed by Duanne Guess, MD with the following instrument(s): Forceps and Scissors. Oasis wound matrix was applied and secured with Steri-Strips. 3 sq cm of product was utilized and 0 sq cm was wasted. Post Application, sorbact was applied. Yu Time Out was conducted at 14:51, prior to the start of the procedure. The procedure was tolerated well with Yu pain level of 0 throughout and Yu pain level of 0 following the procedure. Post procedure Diagnosis Wound #1: Same as Pre-Procedure General Notes: scribed for Dr. Lady Gary by Samuella Bruin, RN. 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 There was Yu Selective/Open Wound Non-Viable Tissue Debridement with Yu total area of 0.38 sq cm performed by Duanne Guess, MD. With the following instrument(s): Curette to remove Non-Viable tissue/material. Material removed includes Eschar and Slough and after achieving pain control using Lidocaine 4% Topical Solution. No specimens were taken. Yu time out was conducted at 14:50, prior to the start of the procedure. Yu Minimum amount of bleeding was controlled with Pressure. The procedure was tolerated well. Post Debridement Measurements: 0.8cm length x 0.6cm width x 0.2cm depth; 0.075cm^3 volume. Post debridement Stage noted as Category/Stage IV. Character of Wound/Ulcer Post Debridement is improved. Post procedure Diagnosis Wound #4: Same as Pre-Procedure General Notes: scribed for Dr. Lady Gary by Samuella Bruin, RN. Pre-procedure diagnosis of Wound #4 is Yu  Pressure Ulcer located on the Left,Lateral Ankle. Yu skin graft procedure using Yu bioengineered skin substitute/cellular or tissue based product was performed by Duanne Guess, MD with the following instrument(s): Forceps and Scissors. Oasis wound matrix was applied and secured with Steri-Strips. 3 sq cm of product was utilized and 0 sq cm was wasted. Post Application, sorbact was applied. Yu Time Out was conducted at 14:51, prior to the start of the procedure. The procedure was tolerated well with Yu pain level of 0 throughout and Yu pain level of 0 following the procedure. Post procedure Diagnosis Wound #4: Same as Pre-Procedure General Notes: scribed for Dr. Lady Gary by Samuella Bruin, RN. Plan Follow-up Appointments: Return Appointment in 1 week. - Dr. Lady Gary - Room 1 Anesthetic: (In clinic) Topical Lidocaine 4% applied to wound bed Cellular or Tissue Based Products: Cellular or Tissue Based Product Type: - oasis tri-layer #3 Oasis wound matrix 08/10/2022 #4 skin sub Cellular or Tissue Based Product applied to wound bed, secured with steri-strips, cover with Adaptic or Mepitel. (DO NOT REMOVE). Bathing/ Shower/ Hygiene: May shower with protection but do not get wound dressing(s) wet. Protect dressing(s) with water repellant cover (for example, large plastic bag) or Yu  cast cover and may then take shower. Off-Loading: Multipodus Splint to: - prevalon boot to both feet Turn and reposition every 2 hours Home Health: No change in wound care orders this week; continue Home Health for wound care. May utilize formulary equivalent dressing for wound treatment orders unless otherwise specified. - may chage outer dressing only on ankles, DO NOT remove sterostrips Dressing changes to be completed by Home Health on Monday / Wednesday / Friday except when patient has scheduled visit at [facility]. Other Home Health Orders/Instructions: - [facility] The following medication(s) was prescribed:  lidocaine topical 4 % cream cream topical was prescribed at facility WOUND #1: - Malleolus Wound Laterality: Right, Medial Cleanser: Normal Saline (Generic) 1 x Per Week/30 Days Discharge Instructions: Cleanse the wound with Normal Saline prior to applying Yu clean dressing using gauze sponges, not tissue or cotton balls. Cleanser: Byram Ancillary Kit - 15 Day Supply (Generic) 1 x Per Week/30 Days Discharge Instructions: Use supplies as instructed; Kit contains: (15) Saline Bullets; (15) 3x3 Gauze; 15 pr Gloves Peri-Wound Care: Skin Prep (Generic) 1 x Per Week/30 Days Discharge Instructions: Use skin prep as directed Prim Dressing: Cutimed Sorbact Swab 1 x Per Week/30 Days ary Discharge Instructions: Apply to wound bed as instructed Prim Dressing: Oasis tri-layer 1 x Per Week/30 Days ary Secured With: [employer], 4.5x3.1 (in/yd) 1 x Per Week/30 Days Discharge Instructions: Secure with Kerlix as directed. Secured With: 36M Medipore Scientist, research (life sciences) Surgical T 2x10 (in/yd) (Generic) 1 x Per Week/30 Days ape Discharge Instructions: Secure with tape as directed. WOUND #2: - Gluteus Wound Laterality: Right Cleanser: Normal Saline (Generic) 1 x Per Day/30 Days Discharge Instructions: Cleanse the wound with Normal Saline prior to applying Yu clean dressing using gauze sponges, not tissue or cotton balls. SHANEKWA, MOLT Yu ([id number]) [id number] Page 12 of 15 Peri-Wound Care: Skin Prep (Generic) 1 x Per Day/30 Days Discharge Instructions: Use skin prep as directed Prim Dressing: Dakin's Solution 0.25%, 16 (oz) (Generic) 1 x Per Day/30 Days ary Discharge Instructions: Moisten gauze with Dakin's solution Prim Dressing: Promogran Prisma Matrix, 4.34 (sq in) (silver collagen) (Dispense As Written) 1 x Per Day/30 Days ary Discharge Instructions: Moisten collagen with saline or hydrogel Prim Dressing: cotton tipped applicators (Generic) 1 x Per Day/30 Days ary Discharge  Instructions: use to pack wound Secondary Dressing: Zetuvit Plus Silicone Border Dressing 5x5 (in/in) (Generic) 1 x Per Day/30 Days Discharge Instructions: Apply silicone border over primary dressing as directed. Secured With: 36M Medipore H Soft Cloth Surgical T ape, 4 x 10 (in/yd) (Generic) 1 x Per Day/30 Days Discharge Instructions: Secure with tape as directed. WOUND #4: - Ankle Wound Laterality: Left, Lateral Cleanser: Normal Saline (Generic) 1 x Per Week/30 Days Discharge Instructions: Cleanse the wound with Normal Saline prior to applying Yu clean dressing using gauze sponges, not tissue or cotton balls. Cleanser: Byram Ancillary Kit - 15 Day Supply (Generic) 1 x Per Week/30 Days Discharge Instructions: Use supplies as instructed; Kit contains: (15) Saline Bullets; (15) 3x3 Gauze; 15 pr Gloves Peri-Wound Care: Skin Prep (Generic) 1 x Per Week/30 Days Discharge Instructions: Use skin prep as directed Prim Dressing: Cutimed Sorbact Swab 1 x Per Week/30 Days ary Discharge Instructions: Apply to wound bed as instructed Prim Dressing: Oasis tri-layer 1 x Per Week/30 Days ary Secondary Dressing: ABD Pad, 5x9 1 x Per Week/30 Days Discharge Instructions: Apply over primary dressing as directed. Secured With: [employer], 4.5x3.1 (in/yd) 1 x Per Week/30 Days Discharge Instructions:  Secure with Kerlix as directed. Secured With: 28M Medipore Scientist, research (life sciences) Surgical T 2x10 (in/yd) (Generic) 1 x Per Week/30 Days ape Discharge Instructions: Secure with tape as directed. 08/10/2022: The cavity at the buttock ulcer has contracted further. The depth remains about the same. The surface is cleaner this week. Both ankle wounds are little bit smaller today. They have rubbery slough on the surface which appears to be leftover Oasis. Underneath, the quality of the tissue continues to improve and there is no longer any bone exposed on the left lateral malleolus. The buttock ulcer did not require  debridement today. We will continue to apply Prisma silver collagen at the base and pack the wound with Dakin's-moistened gauze. I reminded the patient and her daughter that the wound needs to be packed all the way to the very bottom of the cavity. I used Yu curette to debride slough and eschar off of both ankle ulcers. Oasis was then moistened with saline and applied to both sites. It was secured in place with Sorbact and Steri- Strips. Sponge was used as Yu bolster on the left to ensure contact of the skin substitute material with the surface of the wound. Continue to enforce adequate protein intake and offloading. Follow-up in 1 week. Electronic Signature(s) Signed: 08/10/2022 3:19:29 PM By: Duanne Guess MD FACS Entered By: Duanne Guess on 08/10/2022 15:19:29 -------------------------------------------------------------------------------- HxROS Details Patient Name: Date of Service: Andrea Yu 08/10/2022 2:15 PM Medical Record Number: [id number] Patient Account Number: [id number] Date of Birth/Sex: Treating RN: May 04, 1940 (81 y.o. F) Primary Care Provider: Jarome Matin Other Clinician: Referring Provider: Treating Provider/Extender: Marena Chancy in Treatment: 37 Information Obtained From Patient Caregiver Chart Constitutional Symptoms (General Health) Medical History: Past Medical History Notes: morbid obesity Eyes Medical History: Positive for: Cataracts - bil extracted Negative for: Glaucoma; Optic Neuritis Ear/Nose/Mouth/Throat Medical HistoryJANALYNN, BACHELLER ([id number]) [id number] Page 13 of 15 Past Medical History Notes: hard of hearing Cardiovascular Medical History: Positive for: Arrhythmia - afib; Hypertension Gastrointestinal Medical History: Past Medical History Notes: GERD Endocrine Medical History: Negative for: Type I Diabetes; Type II Diabetes Past Medical History Notes: adrenal  insufficiency Genitourinary Medical History: Negative for: End Stage Renal Disease Integumentary (Skin) Medical History: Negative for: History of Burn Musculoskeletal Medical History: Positive for: Osteoarthritis; Osteomyelitis - pelvic Past Medical History Notes: psoriatic arthritis, thoracic discitis, displaced spiral fx right tibia Neurologic Medical History: Positive for: Neuropathy Past Medical History Notes: hx [facility] Oncologic Medical History: Negative for: Received Chemotherapy; Received Radiation Psychiatric Medical History: Positive for: Confinement Anxiety Negative for: Anorexia/bulimia HBO Extended History Items Eyes: Cataracts Immunizations Pneumococcal Vaccine: Received Pneumococcal Vaccination: Yes Received Pneumococcal Vaccination On or After 60th Birthday: Yes Implantable Devices None Hospitalization / Surgery History Type of Hospitalization/Surgery TEE right ankle fusion bil knee replacements bil cataract extractions posterior lumbar fusion vaginal hysterectomy cholecystectomy QUINTA, BAUTZ Yu ([id number]) [id number] Page 14 of 15 Family and Social History Cancer: Yes - Father,Maternal Grandparents,Siblings,Child; Diabetes: No; Heart Disease: Yes - Father; Hereditary Spherocytosis: No; Hypertension: Yes - Father; Kidney Disease: No; Lung Disease: No; Seizures: No; Stroke: No; Thyroid Problems: Yes - Mother; Tuberculosis: No; Former smoker - quit 40 yr ago; Marital Status - Widowed; Alcohol Use: Never; Drug Use: No History; Caffeine Use: Daily - tea, soda; Financial Concerns: No; Food, Clothing or Shelter Needs: No; Support System Lacking: No; Transportation Concerns: No Electronic Signature(s) Signed: 08/10/2022 3:28:51 PM By: Duanne Guess MD FACS Entered By: Duanne Guess on 08/10/2022 15:16:41 -------------------------------------------------------------------------------- SuperBill Details  Patient Name: Date of  Service: Andrea Yu 08/10/2022 Medical Record Number: [id number] Patient Account Number: [id number] Date of Birth/Sex: Treating RN: [birth year] (82 y.o. 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 Z79.52 Long term (current) use of systemic steroids I10 Essential (primary) hypertension Facility Procedures : CPT4 Code: 69629528 Description: 15271 - SKIN SUB GRAFT TRNK/ARM/LEG ICD-10 Diagnosis Description 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, stage 3 L89.523 Pressure ulcer of left ankle, stage 3 Modifier: Quantity: 1 : CPT4 Code: 41324401 Description: Q4102 -Oasis (Wound Matrix) per sq cm ICD-10 Diagnosis Description 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, stage 3 L89.523 Pressure ulcer of left ankle, stage 3 Modifier: Quantity: 11 Physician Procedures : CPT4 Code Description Modifier [id number] - WC PHYS LEVEL 4 - EST PT 25 ICD-10 Diagnosis Description 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

## 2022-08-16 ENCOUNTER — Encounter (HOSPITAL_BASED_OUTPATIENT_CLINIC_OR_DEPARTMENT_OTHER): Payer: Medicare Other | Admitting: General Surgery

## 2022-08-16 DIAGNOSIS — L97812 Non-pressure chronic ulcer of other part of right lower leg with fat layer exposed: Secondary | ICD-10-CM | POA: Diagnosis not present

## 2022-08-17 NOTE — Progress Notes (Signed)
AMMANDA, DOBBINS A ([id number]) [id number] Page 1 of 11 Visit Report for 08/16/2022 Arrival Information Details Patient Name: Date of Service: Andrea Yu 08/16/2022 1:15 PM Medical Record Number: [id number] Patient Account Number: [id number] Date of Birth/Sex: Treating RN: [birth year] (82 y.o. [name] Primary Care Wahid Holley: Jarome Matin Other Clinician: Referring [place]: Treating Mailey Landstrom/Extender: Marena Chancy in Treatment: 37 Visit Information History Since Last Visit Added or deleted any medications: No Patient Arrived: Wheel Chair Any new allergies or adverse reactions: No Arrival Time: 13:29 Had a fall or experienced change in No Accompanied By: caregiver activities of daily living that may affect Transfer Assistance: None risk of falls: Patient Identification Verified: Yes Signs or symptoms of abuse/neglect since last visito No Secondary Verification Process Completed: Yes Hospitalized since last visit: No Patient Requires Transmission-Based Precautions: No Implantable device outside of the clinic excluding No Patient Has Alerts: No cellular tissue based products placed in the center since last visit: Has Dressing in Place as Prescribed: Yes Pain Present Now: Yes Electronic Signature(s) Signed: 08/16/2022 6:34:47 PM By: Zenaida Deed RN, BSN Entered By: Zenaida Deed on 08/16/2022 13:31:24 -------------------------------------------------------------------------------- Encounter Discharge Information Details Patient Name: Date of Service: Andrea Yu 08/16/2022 1:15 PM Medical Record Number: [id number] Patient Account Number: [id number] Date of Birth/Sex: Treating RN: April 19, 1940 (81 y.o. [name] Primary Care Lux Skilton: Jarome Matin Other Clinician: Referring Semisi Biela: Treating Lyberti Thrush/Extender: Marena Chancy in Treatment: 37 Encounter Discharge  Information Items Post Procedure Vitals Discharge Condition: Stable Temperature (F): 98.7 Ambulatory Status: Wheelchair Pulse (bpm): 77 Discharge Destination: Home Respiratory Rate (breaths/min): 18 Transportation: Private Auto Blood Pressure (mmHg): 131/75 Accompanied By: caregiver Schedule Follow-up Appointment: Yes Clinical Summary of Care: Patient Declined Electronic Signature(s) Signed: 08/16/2022 6:34:47 PM By: Zenaida Deed RN, BSN Entered By: Zenaida Deed on 08/16/2022 16:54:22 [name] ([id number]) [id number] Page 2 of 11 -------------------------------------------------------------------------------- Lower Extremity Assessment Details Patient Name: Date of Service: Andrea Yu 08/16/2022 1:15 PM Medical Record Number: [id number] Patient Account Number: [id number] Date of Birth/Sex: Treating RN: Aug 17, 1940 (82 y.o. [name] Primary Care Natha Guin: Jarome Matin Other Clinician: Referring Goebel Hellums: Treating Jaylanni Eltringham/Extender: Marena Chancy in Treatment: 37 Edema Assessment Assessed: Kyra Searles: No] [Right: No] Edema: [Left: No] [Right: No] Calf Left: Right: Point of Measurement: From Medial Instep 29.5 cm 33.3 cm Ankle Left: Right: Point of Measurement: From Medial Instep 18.5 cm 23 cm Vascular Assessment Pulses: Dorsalis Pedis Palpable: [Left:No] [Right:No] Extremity colors, hair growth, and conditions: Extremity Color: [Left:Hyperpigmented] [Right:Normal] Hair Growth on Extremity: [Left:No] [Right:No] Temperature of Extremity: [Left:Warm < 3 seconds] [Right:Warm < 3 seconds] Electronic Signature(s) Signed: 08/16/2022 6:34:47 PM By: Zenaida Deed RN, BSN Entered By: Zenaida Deed on 08/16/2022 13:38:21 -------------------------------------------------------------------------------- Multi Wound Chart Details Patient Name: Date of Service: Andrea Yu 08/16/2022 1:15 PM Medical  Record Number: [id number] Patient Account Number: [id number] Date of Birth/Sex: Treating RN: 03-Sep-1940 (81 y.o. [name] Primary Care Aydan Phoenix: Jarome Matin Other Clinician: Referring Narissa Beaufort: Treating Gerrie Castiglia/Extender: Marena Chancy in Treatment: 37 Vital Signs Height(in): 59 Pulse(bpm): 77 Weight(lbs): 155 Blood Pressure(mmHg): 131/75 Body Mass Index(BMI): 31.3 Temperature(F): 98 Respiratory Rate(breaths/min): 18 [1:Photos:] [4:[id number] Page 3 of 11] Right, Medial Malleolus Right Gluteus Left, Lateral Ankle Wound Location: Pressure Injury Bump Pressure Injury Wounding Event: Pressure Ulcer Abscess Pressure Ulcer Primary Etiology: Cataracts, Arrhythmia, Hypertension,Cataracts, Arrhythmia, Hypertension, Cataracts, Arrhythmia, Hypertension, Comorbid History: Osteoarthritis, Osteomyelitis, Osteoarthritis, Osteomyelitis, Osteoarthritis, Osteomyelitis, Neuropathy, Confinement Anxiety  Neuropathy, Confinement Anxiety Neuropathy, Confinement Anxiety 11/16/2021 09/28/2021 12/23/2021 Date Acquired: 37 37 32 Weeks of Treatment: Open Open Open Wound Status: No No No Wound Recurrence: 1x0.9x0.1 0.5x1.2x3.5 0.9x0.6x0.2 Measurements L x W x D (cm) 0.707 0.471 0.424 A (cm) : rea 0.071 1.649 0.085 Volume (cm) : 54.10% 95.50% -200.70% % Reduction in A rea: 53.90% 94.70% -507.10% % Reduction in Volume: Category/Stage III Full Thickness With Exposed Support Category/Stage IV Classification: Structures Medium Medium Medium Exudate A mount: Serosanguineous Serous Serosanguineous Exudate Type: red, brown amber red, brown Exudate Color: Distinct, outline attached Well defined, not attached Distinct, outline attached Wound Margin: Large (67-100%) Large (67-100%) Small (1-33%) Granulation A mount: Pink, Pale Red Pink Granulation Quality: Small (1-33%) Small (1-33%) Large (67-100%) Necrotic A mount: Fat Layer  (Subcutaneous Tissue): Yes Fat Layer (Subcutaneous Tissue): Yes Fat Layer (Subcutaneous Tissue): Yes Exposed Structures: Fascia: No Fascia: No Tendon: Yes Tendon: No Tendon: No Fascia: No Muscle: No Muscle: No Muscle: No Joint: No Joint: No Joint: No Bone: No Bone: No Bone: No Small (1-33%) Small (1-33%) Small (1-33%) Epithelialization: Debridement - Selective/Open Wound N/A Debridement - Selective/Open Wound Debridement: Pre-procedure Verification/Time Out 13:55 N/A 13:55 Taken: Lidocaine 4% Topical Solution N/A Lidocaine 4% Topical Solution Pain Control: Slough N/A Slough Tissue Debrided: Non-Viable Tissue N/A Non-Viable Tissue Level: 0.92 N/A 0.42 Debridement A (sq cm): rea Curette N/A Curette Instrument: None N/A None Bleeding: 0 N/A 0 Procedural Pain: 0 N/A 0 Post Procedural Pain: Procedure was tolerated well N/A Procedure was tolerated well Debridement Treatment Response: 1.3x0.9x0.1 N/A 0.9x0.6x0.2 Post Debridement Measurements L x W x D (cm) 0.092 N/A 0.085 Post Debridement Volume: (cm) Category/Stage III N/A Category/Stage IV Post Debridement Stage: No Abnormalities Noted No Abnormalities Noted No Abnormalities Noted Periwound Skin Texture: No Abnormalities Noted No Abnormalities Noted No Abnormalities Noted Periwound Skin Moisture: Erythema: Yes Erythema: No Erythema: No Periwound Skin Color: Rubor: No Measured: 2.5cm N/A N/A Erythema Measurement: No Change N/A N/A Erythema Change: No Abnormality No Abnormality No Abnormality Temperature: N/A Yes Yes Tenderness on Palpation: Cellular or Tissue Based Product N/A Cellular or Tissue Based Product Procedures Performed: Debridement Debridement Treatment Notes Electronic Signature(s) Signed: 08/16/2022 2:08:37 PM By: Duanne Guess MD FACS Signed: 08/16/2022 6:34:47 PM By: Zenaida Deed RN, BSN Entered By: Duanne Guess on 08/16/2022  14:08:37 -------------------------------------------------------------------------------- Multi-Disciplinary Care Plan Details Patient Name: Date of Service: [name] 08/16/2022 1:15 PM [name] ([id number]) [id number] Page 4 of 11 Medical Record Number: [id number] Patient Account Number: [id number] Date of Birth/Sex: Treating RN: 05/22/1940 (82 y.o. Billy Coast, Linda Primary Care Darianny Momon: Jarome Matin Other Clinician: Referring Arali Somera: Treating Keelin Sheridan/Extender: Marena Chancy in Treatment: 37 Multidisciplinary Care Plan reviewed with physician Active Inactive Pressure Nursing Diagnoses: Knowledge deficit related to causes and risk factors for pressure ulcer development Knowledge deficit related to management of pressures ulcers Potential for impaired tissue integrity related to pressure, friction, moisture, and shear Goals: Patient/caregiver will verbalize understanding of pressure ulcer management Date Initiated: 11/24/2021 Target Resolution Date: 09/24/2022 Goal Status: Active Interventions: Assess: immobility, friction, shearing, incontinence upon admission and as needed Assess offloading mechanisms upon admission and as needed Assess potential for pressure ulcer upon admission and as needed Provide education on pressure ulcers Treatment Activities: Pressure reduction/relief device ordered : 11/24/2021 Notes: Wound/Skin Impairment Nursing Diagnoses: Impaired tissue integrity Knowledge deficit related to ulceration/compromised skin integrity Goals: Patient/caregiver will verbalize understanding of skin care regimen Date Initiated: 11/24/2021 Target Resolution Date: 09/24/2022 Goal Status: Active Ulcer/skin breakdown  will have a volume reduction of 30% by week 4 Date Initiated: 11/24/2021 Date Inactivated: 05/31/2022 Target Resolution Date: 05/25/2022 Unmet Reason: refuses VAC, Goal Status: Unmet  noncompliant with offloading Interventions: Assess patient/caregiver ability to obtain necessary supplies Assess patient/caregiver ability to perform ulcer/skin care regimen upon admission and as needed Assess ulceration(s) every visit Provide education on ulcer and skin care Treatment Activities: Skin care regimen initiated : 11/24/2021 Topical wound management initiated : 11/24/2021 Notes: Electronic Signature(s) Signed: 08/16/2022 6:34:47 PM By: Zenaida Deed RN, BSN Entered By: Zenaida Deed on 08/16/2022 13:58:15 Pain Assessment Details -------------------------------------------------------------------------------- [name] ([id number]) [id number] Page 5 of 11 Patient Name: Date of Service: Andrea Yu 08/16/2022 1:15 PM Medical Record Number: [id number] Patient Account Number: [id number] Date of Birth/Sex: Treating RN: 16-Feb-1940 (82 y.o. [name] Primary Care Harlee Eckroth: Jarome Matin Other Clinician: Referring Torell Minder: Treating Chardai Gangemi/Extender: Marena Chancy in Treatment: 37 Active Problems Location of Pain Severity and Description of Pain Patient Has Paino Yes Site Locations Pain Location: Pain in Ulcers With Dressing Change: Yes Duration of the Pain. Constant / Intermittento Intermittent Rate the pain. Current Pain Level: 3 Worst Pain Level: 7 Least Pain Level: 0 Character of Pain Describe the Pain: Aching Pain Management and Medication Current Pain Management: Medication: Yes Other: reposition Is the Current Pain Management Adequate: Adequate How does your wound impact your activities of daily livingo Sleep: Yes Bathing: No Appetite: No Relationship With Others: No Bladder Continence: No Emotions: No Bowel Continence: No Work: No Toileting: No Drive: No Dressing: No Hobbies: No Electronic Signature(s) Signed: 08/16/2022 6:34:47 PM By: Zenaida Deed RN, BSN Entered  By: Zenaida Deed on 08/16/2022 13:32:28 -------------------------------------------------------------------------------- Patient/Caregiver Education Details Patient Name: Date of Service: Andrea Yu 7/22/2024andnbsp1:15 PM Medical Record Number: [id number] Patient Account Number: [id number] Date of Birth/Gender: Treating RN: September 04, 1940 (81 y.o. [name] Primary Care Physician: Jarome Matin Other Clinician: Referring Physician: Treating Physician/Extender: Marena Chancy in Treatment: [phone] Education Assessment Education Provided To: Patient LEIANNA, BARGA ([id number]) [id number] Page 6 of 11 Education Topics Provided Pressure: Methods: Explain/Verbal Responses: Reinforcements needed, State content correctly Wound/Skin Impairment: Methods: Explain/Verbal Responses: Reinforcements needed, State content correctly Electronic Signature(s) Signed: 08/16/2022 6:34:47 PM By: Zenaida Deed RN, BSN Entered By: Zenaida Deed on 08/16/2022 13:58:37 -------------------------------------------------------------------------------- Wound Assessment Details Patient Name: Date of Service: Andrea Yu 08/16/2022 1:15 PM Medical Record Number: [id number] Patient Account Number: [id number] Date of Birth/Sex: Treating RN: Jul 15, 1940 (81 y.o. Billy [place], Linda Primary Care Leann Mayweather: Jarome Matin Other Clinician: Referring Shashwat Cleary: Treating Vaniyah Lansky/Extender: Marena Chancy in Treatment: 37 Wound Status Wound Number: 1 Primary Pressure Ulcer Etiology: Wound Location: Right, Medial Malleolus Wound Open Wounding Event: Pressure Injury Status: Date Acquired: 11/16/2021 Comorbid Cataracts, Arrhythmia, Hypertension, Osteoarthritis, Weeks Of Treatment: 37 History: Osteomyelitis, Neuropathy, Confinement Anxiety Clustered Wound: No Photos Wound Measurements Length: (cm) 1 Width: (cm)  0.9 Depth: (cm) 0.1 Area: (cm) 0.707 Volume: (cm) 0.071 % Reduction in Area: 54.1% % Reduction in Volume: 53.9% Epithelialization: Small (1-33%) Tunneling: No Undermining: No Wound Description Classification: Category/Stage III Wound Margin: Distinct, outline attached Exudate Amount: Medium Exudate Type: Serosanguineous Exudate Color: red, brown Foul Odor After Cleansing: No Slough/Fibrino Yes Wound Bed Granulation Amount: Large (67-100%) Exposed Structure Granulation Quality: Pink, Pale Fascia Exposed: No Necrotic Amount: Small (1-33%) Fat Layer (Subcutaneous Tissue) Exposed: Yes Necrotic Quality: Adherent Slough Tendon Exposed: No Muscle Exposed: No KHANIYAH, BEZEK A ([id number]) [id number] Page 7  of 11 Joint Exposed: No Bone Exposed: No Periwound Skin Texture Texture Color No Abnormalities Noted: Yes No Abnormalities Noted: No Erythema: Yes Moisture Erythema Measurement: Measured No Abnormalities Noted: Yes 2.5 cm Erythema Change: No Change Temperature / Pain Temperature: No Abnormality Treatment Notes Wound #1 (Malleolus) Wound Laterality: Right, Medial Cleanser Normal Saline Discharge Instruction: Cleanse the wound with Normal Saline prior to applying a clean dressing using gauze sponges, not tissue or cotton balls. Byram Ancillary Kit - 15 Day Supply Discharge Instruction: Use supplies as instructed; Kit contains: (15) Saline Bullets; (15) 3x3 Gauze; 15 pr Gloves Peri-Wound Care Skin Prep Discharge Instruction: Use skin prep as directed Topical Primary Dressing Cutimed Sorbact Swab Discharge Instruction: Apply to wound bed as instructed Oasis tri-layer Secondary Dressing Woven Gauze Sponge, Non-Sterile 4x4 in Discharge Instruction: Apply over primary dressing as directed. Secured With [employer], 4.5x3.1 (in/yd) Discharge Instruction: Secure with Kerlix as directed. 44M Medipore Soft Cloth Surgical T 2x10  (in/yd) ape Discharge Instruction: Secure with tape as directed. Compression Wrap Compression Stockings Add-Ons Electronic Signature(s) Signed: 08/16/2022 6:34:47 PM By: Zenaida Deed RN, BSN Entered By: Zenaida Deed on 08/16/2022 13:41:13 -------------------------------------------------------------------------------- Wound Assessment Details Patient Name: Date of Service: Andrea Yu 08/16/2022 1:15 PM Medical Record Number: [id number] Patient Account Number: [id number] Date of Birth/Sex: Treating RN: 07-16-1940 (82 y.o. [name] Primary Care Katriana Dortch: Jarome Matin Other Clinician: Referring Sandip Power: Treating Allye Hoyos/Extender: Marena Chancy in Treatment: 37 Wound Status Wound Number: 2 Primary Abscess Etiology: Wound Location: Right PERSAIS, ETHRIDGE A ([id number]) [phone].pdf Page 8 of 11 Wound Open Wounding Event: Bump Status: Date Acquired: 09/28/2021 Comorbid Cataracts, Arrhythmia, Hypertension, Osteoarthritis, Weeks Of Treatment: 37 History: Osteomyelitis, Neuropathy, Confinement Anxiety Clustered Wound: No Photos Wound Measurements Length: (cm) 0.5 Width: (cm) 1.2 Depth: (cm) 3.5 Area: (cm) 0.471 Volume: (cm) 1.649 % Reduction in Area: 95.5% % Reduction in Volume: 94.7% Epithelialization: Small (1-33%) Tunneling: No Undermining: No Wound Description Classification: Full Thickness With Exposed Support Structures Wound Margin: Well defined, not attached Exudate Amount: Medium Exudate Type: Serous Exudate Color: amber Foul Odor After Cleansing: No Slough/Fibrino Yes Wound Bed Granulation Amount: Large (67-100%) Exposed Structure Granulation Quality: Red Fascia Exposed: No Necrotic Amount: Small (1-33%) Fat Layer (Subcutaneous Tissue) Exposed: Yes Necrotic Quality: Adherent Slough Tendon Exposed: No Muscle Exposed: No Joint Exposed: No Bone Exposed: No Periwound Skin  Texture Texture Color No Abnormalities Noted: Yes No Abnormalities Noted: Yes Moisture Temperature / Pain No Abnormalities Noted: Yes Temperature: No Abnormality Tenderness on Palpation: Yes Treatment Notes Wound #2 (Gluteus) Wound Laterality: Right Cleanser Normal Saline Discharge Instruction: Cleanse the wound with Normal Saline prior to applying a clean dressing using gauze sponges, not tissue or cotton balls. Peri-Wound Care Skin Prep Discharge Instruction: Use skin prep as directed Topical Primary Dressing Dakin's Solution 0.25%, 16 (oz) Discharge Instruction: Moisten gauze with Dakin's solution Promogran Prisma Matrix, 4.34 (sq in) (silver collagen) Discharge Instruction: Moisten collagen with saline or hydrogel cotton tipped applicators Discharge Instruction: use to pack wound Secondary Dressing TAKEYLA, MILLION A ([id number]) [id number] Page 9 of 11 Zetuvit Plus Silicone Border Dressing 5x5 (in/in) Discharge Instruction: Apply silicone border over primary dressing as directed. Secured With 44M Medipore H Soft Cloth Surgical T ape, 4 x 10 (in/yd) Discharge Instruction: Secure with tape as directed. Compression Wrap Compression Stockings Add-Ons Electronic Signature(s) Signed: 08/16/2022 6:34:47 PM By: Zenaida Deed RN, BSN Entered By: Zenaida Deed on 08/16/2022 13:47:11 -------------------------------------------------------------------------------- Wound Assessment Details Patient Name: Date of Service:  TA Silvestre Mesi 08/16/2022 1:15 PM Medical Record Number: [id number] Patient Account Number: [id number] Date of Birth/Sex: Treating RN: [birth year] (82 y.o. Billy [place], Linda Primary Care Einer Meals: Jarome Matin Other Clinician: Referring Damian Hofstra: Treating Laporchia Nakajima/Extender: Marena Chancy in Treatment: 37 Wound Status Wound Number: 4 Primary Pressure Ulcer Etiology: Wound Location: Left, Lateral  Ankle Wound Open Wounding Event: Pressure Injury Status: Date Acquired: 12/23/2021 Comorbid Cataracts, Arrhythmia, Hypertension, Osteoarthritis, Weeks Of Treatment: 32 History: Osteomyelitis, Neuropathy, Confinement Anxiety Clustered Wound: No Photos Wound Measurements Length: (cm) 0.9 Width: (cm) 0.6 Depth: (cm) 0.2 Area: (cm) 0.424 Volume: (cm) 0.085 % Reduction in Area: -200.7% % Reduction in Volume: -507.1% Epithelialization: Small (1-33%) Tunneling: No Undermining: No Wound Description Classification: Category/Stage IV Wound Margin: Distinct, outline attached Exudate Amount: Medium Exudate Type: Serosanguineous Exudate Color: red, brown Foul Odor After Cleansing: No Slough/Fibrino Yes Wound Bed Granulation Amount: Small (1-33%) Exposed Structure Granulation Quality: Pink Fascia Exposed: No Necrotic Amount: Large (67-100%) Fat Layer (Subcutaneous Tissue) Exposed: Yes JODEL, MAYHALL A ([id number]) [id number] Page 10 of 11 Necrotic Quality: Adherent Slough Tendon Exposed: Yes Muscle Exposed: No Joint Exposed: No Bone Exposed: No Periwound Skin Texture Texture Color No Abnormalities Noted: Yes No Abnormalities Noted: Yes Moisture Temperature / Pain No Abnormalities Noted: Yes Temperature: No Abnormality Tenderness on Palpation: Yes Treatment Notes Wound #4 (Ankle) Wound Laterality: Left, Lateral Cleanser Normal Saline Discharge Instruction: Cleanse the wound with Normal Saline prior to applying a clean dressing using gauze sponges, not tissue or cotton balls. Byram Ancillary Kit - 15 Day Supply Discharge Instruction: Use supplies as instructed; Kit contains: (15) Saline Bullets; (15) 3x3 Gauze; 15 pr Gloves Peri-Wound Care Skin Prep Discharge Instruction: Use skin prep as directed Topical Primary Dressing Cutimed Sorbact Swab Discharge Instruction: Apply to wound bed as instructed Oasis tri-layer Secondary Dressing Woven Gauze  Sponge, Non-Sterile 4x4 in Discharge Instruction: Apply over primary dressing as directed. Secured With [employer], 4.5x3.1 (in/yd) Discharge Instruction: Secure with Kerlix as directed. 104M Medipore Soft Cloth Surgical T 2x10 (in/yd) ape Discharge Instruction: Secure with tape as directed. Compression Wrap Compression Stockings Add-Ons Electronic Signature(s) Signed: 08/16/2022 6:34:47 PM By: Zenaida Deed RN, BSN Entered By: Zenaida Deed on 08/16/2022 13:41:50 -------------------------------------------------------------------------------- Vitals Details Patient Name: Date of Service: Andrea Yu 08/16/2022 1:15 PM Medical Record Number: [id number] Patient Account Number: [id number] Date of Birth/Sex: Treating RN: 03/09/1940 (81 y.o. [name] Primary Care Ayo Guarino: Jarome Matin Other Clinician: Referring Eben Choinski: Treating Yann Biehn/Extender: Marena Chancy in Treatment: 37 Vital Signs Time Taken: 13:31 Temperature (F): 98 Height (in): 59 Pulse (bpm): 77 Pandit, Donnah A ([id number]) [id number] Page 11 of 11 Weight (lbs): 155 Respiratory Rate (breaths/min): 18 Body Mass Index (BMI): 31.3 Blood Pressure (mmHg): 131/75 Reference Range: 80 - 120 mg / dl Electronic Signature(s) Signed: 08/16/2022 6:34:47 PM By: Zenaida Deed RN, BSN Entered By: Zenaida Deed on 08/16/2022 13:31:42

## 2022-08-17 NOTE — Progress Notes (Signed)
ADY, HEIMANN A ([id number]) [id number] Page 1 of 15 Visit Report for 08/16/2022 Chief Complaint Document Details Patient Name: Date of Service: Andrea Yu 08/16/2022 1:15 PM Medical Record Number: [id number] Patient Account Number: [id number] Date of Birth/Sex: Treating RN: [birth year] (82 y.o. Andrea Yu Primary Care Provider: Jarome Matin Other Clinician: Referring Provider: Treating Provider/Extender: Marena Chancy in Treatment: 37 Information Obtained from: Patient Chief Complaint Patient is at the clinic for treatment of an open pressure ulcer on her right medial ankle, and a large abscess on her right buttock Electronic Signature(s) Signed: 08/16/2022 2:09:02 PM By: Duanne Guess MD FACS Entered By: Duanne Guess on 08/16/2022 14:09:02 -------------------------------------------------------------------------------- Cellular or Tissue Based Product Details Patient Name: Date of Service: Andrea Yu 08/16/2022 1:15 PM Medical Record Number: [id number] Patient Account Number: [id number] Date of Birth/Sex: Treating RN: 13-Feb-1940 (81 y.o. Andrea Yu Primary Care Provider: Jarome Matin Other Clinician: Referring Provider: Treating Provider/Extender: Marena Chancy in Treatment: 37 Cellular or Tissue Based Product Type Wound #1 Right,Medial Malleolus Applied to: Performed By: Physician Duanne Guess, MD Cellular or Tissue Based Product Type: Oasis tri-layer Level of Consciousness (Pre-procedure): Awake and Alert Pre-procedure Verification/Time Out Yes - 14:00 Taken: Location: trunk / arms / legs Wound Size (sq cm): 0.9 Product Size (sq cm): 5 Waste Size (sq cm): 2 Waste Reason: wound size Amount of Product Applied (sq cm): 3 Lot #: [id number] Order #: 5 Expiration Date: 05/01/2024 Fenestrated: No Reconstituted: Yes Solution Type: saline Solution Amount:  5 ml Lot #: [id number] KS Solution Expiration Date: 02/16/2024 Secured: Yes Secured With: Steri-Strips Dressing Applied: Yes Primary Dressing: sorbact, gauze Procedural Pain: 0 Post Procedural Pain: 0 Response to Treatment: Procedure was tolerated well Level of Consciousness (Post- Awake and Alert ANJELIQUE, MAKAR A ([id number]) [id number] Page 2 of 15 procedure): Post Procedure Diagnosis Same as Pre-procedure Notes scribed for Dr. Lady Gary by Zenaida Deed, RN Electronic Signature(s) Signed: 08/16/2022 3:58:48 PM By: Duanne Guess MD FACS Signed: 08/16/2022 6:34:47 PM By: Zenaida Deed RN, BSN Entered By: Zenaida Deed on 08/16/2022 14:04:05 -------------------------------------------------------------------------------- Cellular or Tissue Based Product Details Patient Name: Date of Service: Andrea Yu 08/16/2022 1:15 PM Medical Record Number: [id number] Patient Account Number: [id number] Date of Birth/Sex: Treating RN: [birth year] (81 y.o. Andrea Yu Primary Care Provider: Jarome Matin Other Clinician: Referring Provider: Treating Provider/Extender: Marena Chancy in Treatment: 37 Cellular or Tissue Based Product Type Wound #4 Left,Lateral Ankle Applied to: Performed By: Physician Duanne Guess, MD Cellular or Tissue Based Product Type: Oasis tri-layer Level of Consciousness (Pre-procedure): Awake and Alert Pre-procedure Verification/Time Out Yes - 14:00 Taken: Location: trunk / arms / legs Wound Size (sq cm): 0.54 Product Size (sq cm): 5 Waste Size (sq cm): 3 Waste Reason: wound size Amount of Product Applied (sq cm): 2 Instrument Used: Forceps, Scissors Lot #: [id number] Order #: 5 Expiration Date: 05/01/2024 Fenestrated: No Reconstituted: Yes Solution Type: saline Solution Amount: 5 ml Lot #: [id number] KS Solution Expiration Date: 02/16/2024 Secured: Yes Secured With: Steri-Strips Dressing  Applied: Yes Primary Dressing: sorbact, gauze Procedural Pain: 0 Post Procedural Pain: 0 Response to Treatment: Procedure was tolerated well Level of Consciousness (Post- Awake and Alert procedure): Post Procedure Diagnosis Same as Pre-procedure Notes scribed for Dr. Lady Gary by Zenaida Deed, RN Electronic Signature(s) Signed: 08/16/2022 2:08:52 PM By: Duanne Guess MD FACS Entered By: Duanne Guess on 08/16/2022 14:08:52 [name] ([id number]) [id number] Page 3 of 15 -------------------------------------------------------------------------------- Debridement  Details Patient Name: Date of Service: Andrea Yu 08/16/2022 1:15 PM Medical Record Number: [id number] Patient Account Number: [id number] Date of Birth/Sex: Treating RN: December 14, 1940 (82 y.o. Andrea Yu Primary Care Provider: Jarome Matin Other Clinician: Referring Provider: Treating Provider/Extender: Marena Chancy in Treatment: 37 Debridement Performed for Assessment: Wound #4 Left,Lateral Ankle Performed By: Physician Duanne Guess, MD Debridement Type: Debridement Level of Consciousness (Pre-procedure): Awake and Alert Pre-procedure Verification/Time Out Yes - 13:55 Taken: Start Time: 13:56 Pain Control: Lidocaine 4% T opical Solution Percent of Wound Bed Debrided: 100% T Area Debrided (cm): otal 0.42 Tissue and other material debrided: Non-Viable, Slough, Slough Level: Non-Viable Tissue Debridement Description: Selective/Open Wound Instrument: Curette Bleeding: None Procedural Pain: 0 Post Procedural Pain: 0 Response to Treatment: Procedure was tolerated well Level of Consciousness (Post- Awake and Alert procedure): Post Debridement Measurements of Total Wound Length: (cm) 0.9 Stage: Category/Stage IV Width: (cm) 0.6 Depth: (cm) 0.2 Volume: (cm) 0.085 Character of Wound/Ulcer Post Debridement: Improved Post Procedure  Diagnosis Same as Pre-procedure Notes scribed for Dr. Lady Gary by Zenaida Deed, RN Electronic Signature(s) Signed: 08/16/2022 3:58:48 PM By: Duanne Guess MD FACS Signed: 08/16/2022 6:34:47 PM By: Zenaida Deed RN, BSN Entered By: Zenaida Deed on 08/16/2022 13:59:59 -------------------------------------------------------------------------------- Debridement Details Patient Name: Date of Service: [name] 08/16/2022 1:15 PM Medical Record Number: [id number] Patient Account Number: [id number] Date of Birth/Sex: Treating RN: 09/10/1940 (81 y.o. Andrea Yu Primary Care Provider: Jarome Matin Other Clinician: Referring Provider: Treating Provider/Extender: Marena Chancy in Treatment: 37 Debridement Performed for Assessment: Wound #1 Right,Medial Malleolus Performed By: Physician Duanne Guess, MD Debridement Type: Debridement Level of Consciousness (Pre-procedure): Awake and Alert Pre-procedure Verification/Time Out Yes - 13:55 Taken: SHAUNTEE, KARP ([id number]) [id number] Page 4 of 15 Start Time: 13:56 Pain Control: Lidocaine 4% T opical Solution Percent of Wound Bed Debrided: 100% T Area Debrided (cm): otal 0.92 Tissue and other material debrided: Non-Viable, Slough, Slough Level: Non-Viable Tissue Debridement Description: Selective/Open Wound Instrument: Curette Bleeding: None Procedural Pain: 0 Post Procedural Pain: 0 Response to Treatment: Procedure was tolerated well Level of Consciousness (Post- Awake and Alert procedure): Post Debridement Measurements of Total Wound Length: (cm) 1.3 Stage: Category/Stage III Width: (cm) 0.9 Depth: (cm) 0.1 Volume: (cm) 0.092 Character of Wound/Ulcer Post Debridement: Improved Post Procedure Diagnosis Same as Pre-procedure Notes scribed for Dr. Lady Gary by Zenaida Deed, RN Electronic Signature(s) Signed: 08/16/2022 3:58:48 PM By: Duanne Guess  MD FACS Signed: 08/16/2022 6:34:47 PM By: Zenaida Deed RN, BSN Entered By: Zenaida Deed on 08/16/2022 14:00:26 -------------------------------------------------------------------------------- HPI Details Patient Name: Date of Service: Andrea Yu 08/16/2022 1:15 PM Medical Record Number: [id number] Patient Account Number: [id number] Date of Birth/Sex: Treating RN: 22-Oct-1940 (81 y.o. 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 She is not diabetic and quit smoking over 40 years ago. 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 She has developed a pressure ulcer on the right medial malleolus. In addition, she apparently has had multiple cutaneous abscesses on her buttocks that have required repeated incision and drainage procedures. She has developed a large abscess on her right buttock that has become painful. She was recently  treated with cephalexin by her PCP for urinary tract infection and also received a shot of Rocephin for the abscess, but it has not yet been incised or drained. She is nonambulatory but is able to stand to transfer. She does not wear any sort of Prevalon boot. ABI in clinic today was 0.96. On her right medial malleolus, there is a circular ulcer. The surface is dry and fibrotic. There is some periwound erythema, but no malodor or purulent drainage. On her right buttock, there is a large fluctuant bulge about the size of an orange. It is also erythematous and tender. 12/04/2021: The culture from her abscess grew out staph. A 10-day course of Bactrim was prescribed and she is currently taking  this. Unfortunately, Dakin's solution was not delivered and only 2 x 2 gauze was sent, rather than Kerlix so the packing of the wound has been rather suboptimal. The wound cavity has light slough over all of the surfaces. Although covered, bone is palpable. The medial ankle wound looks about the same with a layer of slough accumulation. In addition, her daughter peeled some dry skin off of her foot this morning and she now has denuded areas over the majority of the plantar surface of her right foot. 01/01/2022: The patient has missed several visits and to her daughters report that it is somewhat difficult to get her here on a weekly basis. In the interim, she has developed a new pressure ulcer on her left lateral malleolus. The plantar surface of her right foot has healed. The cavity on her right buttock has contracted, but it remains quite deep and the trochanter, while not exposed, is palpable at the base. The pressure ulcer on her right medial malleolus is basically unchanged. 02/09/2022: All wounds are little bit smaller today. As the buttock abscess cavity has contracted, it has left some tunneling that has not been adequately packed. Light slough accumulation on both ankle wounds. JAQUALA, FULLER A ([id number]) [id number] Page 5 of 15 02/26/2022: All of the wounds continue to contract. There is slough on both ankle wounds. The buttock ulcer is clean. She brought her wound VAC with her today. 03/12/2022: The ankle wounds are about the same this week with slough accumulation. The buttock ulcer is smaller and quite clean. She decided that she could not tolerate the discomfort of the wound VAC and removed it. They have been packing the wound with Dakin's moistened gauze. 03/25/2022: The ankle wounds are slightly smaller. Both have slough accumulation. The orifice of the buttock ulcer has gotten quite a bit smaller than the underlying cavity. She has decided that she would like to  have the wound VAC back. 3/14; patient presents for follow-up. She has bilateral ankle wounds and has been using Hydrofera Blue to the these wound beds. She has Prevalon boots to help with offloading. She also has a sacral wound that we have been using a wound VAC for. She has no issues or complaints today. 04/29/2022: She once again decided she did not like the wound VAC and so they have been packing her gluteal wound with Dakin's moistened gauze. The gluteal ulcer seems to be about the same. Her left lateral ankle wound has gotten deeper and larger. The surface tissue is gray with thick slough. No significant change to the right medial ankle wound. 05/18/2022: The depth on the gluteal ulcer has come in a bit. There was a very strong pungent odor when the packing was removed, however. The right medial ankle wound is about  the same size, but the tissue surface looks healthier. The left lateral ankle wound has reaccumulated the rubbery gray slough. 05/31/2022: The gluteal ulcer is shallower and no longer has any dips or crevices. The odor has abated completely. The right medial ankle wound is unchanged. The left lateral ankle wound looks quite a bit healthier with a cleaner surface and the beginnings of granulation tissue emergence. 06/16/2022: The gluteal ulcer has the same measurements more or less, but overall the volume seems smaller, as the cavity feels tighter on examination. The ankle wounds are basically the same. 07/12/2022: The cavity of the buttock ulcer continues to contract. The left ankle wound has a little bit more granulation tissue, but bone remains exposed. The right ankle wound is flush with the surrounding skin. 07/21/2022: There is 1 deeper area in the buttock ulcer that I have not appreciated previously. I can feel bone under a layer of tissue. The rest of the cavity has contracted. Both ankle wounds are filling in with better granulation tissue. There is slough on both ankle wound  surfaces. She struck her leg on her bed frame and has 2 new wounds on her right anterior tibial surface. Both have fat layer exposure but no concern for infection. 08/02/2022: I do not feel bone as easily in the buttock ulcer. There is slough on the surface. The cavity seems to be contracting. Both ankle wounds have improved tissue quality and there is granulation tissue nearly completely covering the bone on the left lateral malleolus. The wounds on her right anterior tibial surface have dried up. 08/10/2022: The cavity at the buttock ulcer has contracted further. The depth remains about the same. The surface is cleaner this week. Both ankle wounds are little bit smaller today. They have rubbery slough on the surface which appears to be leftover Oasis. Underneath, the quality of the tissue continues to improve and there is no longer any bone exposed on the left lateral malleolus. 08/16/2022: The buttock ulcer cavity has contracted further and she has less tenderness at the deepest aspect of the wound. Both ankle wounds look smaller today, although they did not measure any differently. Minimal slough accumulation with fairly healthy-looking granulation tissue present. Electronic Signature(s) Signed: 08/16/2022 2:10:15 PM By: Duanne Guess MD FACS Entered By: Duanne Guess on 08/16/2022 14:10:14 -------------------------------------------------------------------------------- Physical Exam Details Patient Name: Date of Service: Andrea Yu 08/16/2022 1:15 PM Medical Record Number: [id number] Patient Account Number: [id number] Date of Birth/Sex: Treating RN: Nov 04, 1940 (82 y.o. Andrea Yu Primary Care Provider: Jarome Matin Other Clinician: Referring Provider: Treating Provider/Extender: Marena Chancy in Treatment: 37 Constitutional . . . . no acute distress. Respiratory Normal work of breathing on room air. Notes 08/16/2022: The buttock ulcer  cavity has contracted further and she has less tenderness at the deepest aspect of the wound. Both ankle wounds look smaller today, although they did not measure any differently. Minimal slough accumulation with fairly healthy-looking granulation tissue present. Electronic Signature(s) Signed: 08/16/2022 2:11:16 PM By: Duanne Guess MD FACS Entered By: Duanne Guess on 08/16/2022 14:11:16 [name] ([id number]) [id number] Page 6 of 15 -------------------------------------------------------------------------------- Physician Orders Details Patient Name: Date of Service: Andrea Yu 08/16/2022 1:15 PM Medical Record Number: [id number] Patient Account Number: [id number] Date of Birth/Sex: Treating RN: 1940/09/11 (82 y.o. 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 - Dr. Lady Gary - Room 1 Return A Tuesday 7/30 @ 1:15 pm Anesthetic (In clinic) Topical Lidocaine 4% applied to wound bed Cellular or Tissue Based Products Wound #1 Right,Medial Malleolus Cellular or Tissue Based Product Type: - oasis tri-layer #5 Cellular or Tissue Based Product applied to wound bed, secured with steri-strips, cover with Adaptic or Mepitel. (DO NOT REMOVE). Wound #4  Left,Lateral Ankle Cellular or Tissue Based Product Type: - oasis tri-layer #5 Cellular or Tissue Based Product applied to wound bed, secured with steri-strips, cover with Adaptic or Mepitel. (DO NOT REMOVE). Bathing/ Shower/ Hygiene May shower with protection but do not get wound dressing(s) wet. Protect dressing(s) with water repellant cover (for example, large plastic bag) or a cast cover and may then take shower. Off-Loading Multipodus Splint to: - prevalon boot to both feet Turn and reposition every 2 hours Home Health No change in wound care orders this week; continue Home Health for wound care. May utilize formulary equivalent dressing for wound treatment orders unless otherwise specified. - may chage outer dressing only on ankles, DO NOT remove sterostrips Dressing changes to be completed by Home Health on Monday / Wednesday / Friday except when patient has scheduled visit at [facility]. Other Home Health Orders/Instructions: - [facility] Wound Treatment Wound #1 - Malleolus Wound Laterality: Right, Medial Cleanser: Normal Saline (Generic) 1 x Per Week/30 Days Discharge Instructions: Cleanse the wound with Normal Saline prior to applying a clean dressing using gauze sponges, not tissue or cotton balls. Cleanser: Byram Ancillary Kit - 15 Day Supply (Generic) 1 x Per Week/30 Days Discharge Instructions: Use supplies as instructed; Kit contains: (15) Saline Bullets; (15) 3x3 Gauze; 15 pr Gloves Peri-Wound Care: Skin Prep (Generic) 1 x Per Week/30 Days Discharge Instructions: Use skin prep as directed Prim Dressing: Cutimed Sorbact Swab 1 x Per Week/30 Days ary Discharge Instructions: Apply to wound bed as instructed Prim Dressing: Oasis tri-layer ary 1 x Per Week/30 Days BRYSTOL, WASILEWSKI A ([id number]) [id number] Page 7 of 15 Secondary Dressing: Woven Gauze Sponge, Non-Sterile 4x4 in 1 x Per Week/30 Days Discharge Instructions: Apply over primary  dressing as directed. Secured With: [employer], 4.5x3.1 (in/yd) 1 x Per Week/30 Days Discharge Instructions: Secure with Kerlix as directed. Secured With: 41M Medipore Scientist, research (life sciences) Surgical T 2x10 (in/yd) (Generic) 1 x Per Week/30 Days ape Discharge Instructions: Secure with tape as directed. Wound #2 - Gluteus Wound Laterality: Right Cleanser: Normal Saline (Generic) 1 x Per Day/30 Days Discharge Instructions: Cleanse the wound with Normal Saline prior to applying a clean dressing using gauze sponges, not tissue or cotton balls. Peri-Wound Care: Skin Prep (Generic) 1 x Per Day/30 Days Discharge Instructions: Use skin prep as directed Prim Dressing: Dakin's Solution 0.25%, 16 (oz) (Generic) 1 x Per Day/30 Days ary Discharge Instructions: Moisten gauze with Dakin's solution Prim Dressing: Promogran Prisma Matrix, 4.34 (sq in) (silver collagen) (Dispense As Written) 1 x Per Day/30 Days ary Discharge Instructions: Moisten collagen with saline or hydrogel Prim Dressing: cotton tipped applicators (Generic) 1 x Per Day/30 Days ary Discharge Instructions:  use to pack wound Secondary Dressing: Zetuvit Plus Silicone Border Dressing 5x5 (in/in) (Generic) 1 x Per Day/30 Days Discharge Instructions: Apply silicone border over primary dressing as directed. Secured With: 74M Medipore H Soft Cloth Surgical T ape, 4 x 10 (in/yd) (Generic) 1 x Per Day/30 Days Discharge Instructions: Secure with tape as directed. Wound #4 - Ankle Wound Laterality: Left, Lateral Cleanser: Normal Saline (Generic) 1 x Per Week/30 Days Discharge Instructions: Cleanse the wound with Normal Saline prior to applying a clean dressing using gauze sponges, not tissue or cotton balls. Cleanser: Byram Ancillary Kit - 15 Day Supply (Generic) 1 x Per Week/30 Days Discharge Instructions: Use supplies as instructed; Kit contains: (15) Saline Bullets; (15) 3x3 Gauze; 15 pr Gloves Peri-Wound Care: Skin Prep (Generic) 1 x Per Week/30  Days Discharge Instructions: Use skin prep as directed Prim Dressing: Cutimed Sorbact Swab 1 x Per Week/30 Days ary Discharge Instructions: Apply to wound bed as instructed Prim Dressing: Oasis tri-layer ary 1 x Per Week/30 Days Secondary Dressing: Woven Gauze Sponge, Non-Sterile 4x4 in 1 x Per Week/30 Days Discharge Instructions: Apply over primary dressing as directed. Secured With: [employer], 4.5x3.1 (in/yd) 1 x Per Week/30 Days Discharge Instructions: Secure with Kerlix as directed. Secured With: 74M Medipore Scientist, research (life sciences) Surgical T 2x10 (in/yd) (Generic) 1 x Per Week/30 Days ape Discharge Instructions: Secure with tape as directed. Electronic Signature(s) Signed: 08/16/2022 3:58:48 PM By: Duanne Guess MD FACS Entered By: Duanne Guess on 08/16/2022 14:11:29 -------------------------------------------------------------------------------- Problem List Details Patient Name: Date of Service: Andrea Yu 08/16/2022 1:15 PM Medical Record Number: [id number] Patient Account Number: [id number] Date of Birth/Sex: Treating RN: 1940/06/29 (81 y.o. 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 01/01/2022 No Yes evidence of necrosis L02.31 Cutaneous abscess of buttock 11/24/2021 No Yes E27.40 Unspecified adrenocortical insufficiency 11/24/2021 No Yes Z79.52 Long term (current) use of systemic steroids 11/24/2021 No Yes I10 Essential (primary) hypertension 11/24/2021 No  Yes Inactive Problems ICD-10 Code Description Active Date Inactive Date L97.812 Non-pressure chronic ulcer of other part of right lower leg with fat layer exposed 07/21/2022 07/21/2022 Resolved Problems ICD-10 Code Description Active Date Resolved Date L97.511 Non-pressure chronic ulcer of other part of right foot limited to breakdown of skin 12/04/2021 12/04/2021 Electronic Signature(s) Signed: 08/16/2022 2:07:59 PM By: Duanne Guess MD FACS Entered By: Duanne Guess on 08/16/2022 14:07:59 -------------------------------------------------------------------------------- Progress Note Details Patient Name: Date of Service: Andrea Yu 08/16/2022 1:15 PM Medical Record Number: [id number] Patient Account Number: [id number] Date of Birth/Sex: Treating RN: Sep 06, 1940 (82 y.o. 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 She is not diabetic and quit smoking over 40 years ago. 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 She has developed a pressure ulcer on the right medial malleolus. In addition, she apparently has had multiple cutaneous abscesses on her buttocks that have required repeated incision and drainage procedures. She has developed a large abscess on her right buttock that has become painful. She was recently treated with cephalexin by her PCP  for urinary tract infection and also received a shot of Rocephin for the abscess, but it has not yet been incised or drained. She is nonambulatory but is able to stand to transfer. She does not wear any sort of Prevalon boot. ABI in clinic today was 0.96. On her right medial malleolus, there is a circular ulcer. The surface is dry and fibrotic. There is some periwound erythema, but no malodor or purulent drainage. On her right buttock, there is a large fluctuant bulge about the size of an orange. It is also erythematous and tender. 12/04/2021: The culture from her abscess grew out staph. A 10-day course of Bactrim was prescribed and she is currently taking this. Unfortunately, Dakin's solution was not delivered and only 2 x 2 gauze was sent, rather than Kerlix so the packing of the wound has been rather suboptimal. The wound cavity has light slough over all of the surfaces. Although covered, bone is palpable. The medial ankle wound looks about the same with a layer of slough accumulation. In addition, her daughter peeled some dry skin off of her foot this morning and she now has denuded areas over the majority of the plantar surface of her right foot. 01/01/2022: The patient has missed several visits and to her daughters report that it is somewhat difficult to get her here on a weekly basis. In the interim, she has developed a new pressure ulcer on her left lateral malleolus. The plantar surface of her right foot has healed. The cavity on her right buttock has contracted, but it remains quite deep and the trochanter, while not exposed, is palpable at the base. The pressure ulcer on her right medial malleolus is basically unchanged. 02/09/2022: All wounds are little bit smaller today. As the buttock abscess cavity has contracted, it has left some tunneling that has not been adequately packed. Light slough accumulation on both ankle wounds. 02/26/2022: All of the wounds continue to contract. There is  slough on both ankle wounds. The buttock ulcer is clean. She brought her wound VAC with her today. 03/12/2022: The ankle wounds are about the same this week with slough accumulation. The buttock ulcer is smaller and quite clean. She decided that she could not tolerate the discomfort of the wound VAC and removed it. They have been packing the wound with Dakin's moistened gauze. 03/25/2022: The ankle wounds are slightly smaller. Both have slough accumulation. The orifice of the buttock ulcer has gotten quite a bit smaller than the underlying cavity. She has decided that she would like to have the wound VAC back. 3/14; patient presents for follow-up. She has bilateral ankle wounds and has been using Hydrofera Blue to the these wound beds. She has Prevalon boots to help with offloading. She also has a sacral wound that we have been using a wound VAC for. She has no issues or complaints today. 04/29/2022: She once again decided she did not like the wound VAC and so they have been packing her gluteal wound with Dakin's moistened gauze. The gluteal ulcer seems to be about the  same. Her left lateral ankle wound has gotten deeper and larger. The surface tissue is gray with thick slough. No significant change to the right medial ankle wound. 05/18/2022: The depth on the gluteal ulcer has come in a bit. There was a very strong pungent odor when the packing was removed, however. The right medial ankle wound is about the same size, but the tissue surface looks healthier. The left lateral ankle wound has reaccumulated the rubbery gray slough. 05/31/2022: The gluteal ulcer is shallower and no longer has any dips or crevices. The odor has abated completely. The right medial ankle wound is unchanged. The left lateral ankle wound looks quite a bit healthier with a cleaner surface and the beginnings of granulation tissue emergence. 06/16/2022: The gluteal ulcer has the same measurements more or less, but overall the volume seems  smaller, as the cavity feels tighter on examination. The ankle wounds are basically the same. 07/12/2022: The cavity of the buttock ulcer continues to contract. The left ankle wound has a little bit more granulation tissue, but bone remains exposed. The right ankle wound is flush with the surrounding skin. 07/21/2022: There is 1 deeper area in the buttock ulcer that I have not appreciated previously. I can feel bone under a layer of tissue. The rest of the cavity has contracted. Both ankle wounds are filling in with better granulation tissue. There is slough on both ankle wound surfaces. She struck her leg on her bed frame and has 2 new wounds on her right anterior tibial surface. Both have fat layer exposure but no concern for infection. 08/02/2022: I do not feel bone as easily in the buttock ulcer. There is slough on the surface. The cavity seems to be contracting. Both ankle wounds have improved tissue quality and there is granulation tissue nearly completely covering the bone on the left lateral malleolus. The wounds on her right anterior tibial surface have dried up. 08/10/2022: The cavity at the buttock ulcer has contracted further. The depth remains about the same. The surface is cleaner this week. Both ankle wounds are little bit smaller today. They have rubbery slough on the surface which appears to be leftover Oasis. Underneath, the quality of the tissue continues to improve and there is no longer any bone exposed on the left lateral malleolus. 08/16/2022: The buttock ulcer cavity has contracted further and she has less tenderness at the deepest aspect of the wound. Both ankle wounds look smaller today, although they did not measure any differently. Minimal slough accumulation with fairly healthy-looking granulation tissue present. Patient History Information obtained from Patient, Caregiver, Chart. Family History Cancer - Father,Maternal Grandparents,Siblings,Child, Heart Disease - Father,  Hypertension - Father, Thyroid Problems - Mother, No family history of Diabetes, Hereditary Spherocytosis, Kidney Disease, Lung Disease, Seizures, Stroke, Tuberculosis. Social History Former smoker - quit 40 yr ago, Marital Status - Widowed, Alcohol Use - Never, Drug Use - No History, Caffeine Use - Daily - tea, soda. Medical History Eyes Patient has history of Cataracts - bil extracted HAYDAN, WEDIG A ([id number]) [id number] Page 10 of 15 Denies history of Glaucoma, Optic Neuritis Cardiovascular Patient has history of Arrhythmia - afib, Hypertension Endocrine Denies history of Type I Diabetes, Type II Diabetes Genitourinary Denies history of End Stage Renal Disease Integumentary (Skin) Denies history of History of Burn Musculoskeletal Patient has history of Osteoarthritis, Osteomyelitis - pelvic Neurologic Patient has history of Neuropathy Oncologic Denies history of Received Chemotherapy, Received Radiation Psychiatric Patient has history of Confinement Anxiety Denies history of  Anorexia/bulimia Hospitalization/Surgery History - TEE. - right ankle fusion. - bil knee replacements. - bil cataract extractions. - posterior lumbar fusion. - vaginal hysterectomy. - cholecystectomy. 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 Vitals Time Taken: 1:31 PM, Height: 59 in, Weight: 155 lbs, BMI: 31.3, Temperature: 98 F, Pulse: 77 bpm, Respiratory Rate: 18 breaths/min, Blood Pressure: 131/75 mmHg. Respiratory Normal work of breathing on room air. General Notes: 08/16/2022: The buttock ulcer cavity has contracted further and she has less tenderness at the deepest aspect  of the wound. Both ankle wounds look smaller today, although they did not measure any differently. Minimal slough accumulation with fairly healthy-looking granulation tissue present. Integumentary (Hair, Skin) Wound #1 status is Open. Original cause of wound was Pressure Injury. The date acquired was: 11/16/2021. The wound has been in treatment 37 weeks. The wound is located on the Right,Medial Malleolus. The wound measures 1cm length x 0.9cm width x 0.1cm depth; 0.707cm^2 area and 0.071cm^3 volume. There is Fat Layer (Subcutaneous Tissue) exposed. There is no tunneling or undermining noted. There is a medium amount of serosanguineous drainage noted. The wound margin is distinct with the outline attached to the wound base. There is large (67-100%) pink, pale granulation within the wound bed. There is a small (1- 33%) amount of necrotic tissue within the wound bed including Adherent Slough. The periwound skin appearance had no abnormalities noted for texture. The periwound skin appearance had no abnormalities noted for moisture. The periwound skin appearance exhibited: Erythema. The surrounding wound skin color is noted with erythema. Erythema is measured at 2.5 cm. Periwound temperature was noted as No Abnormality. Wound #2 status is Open. Original cause of wound was Bump. The date acquired was: 09/28/2021. The wound has been in treatment 37 weeks. The wound is located on the Right Gluteus. The wound measures 0.5cm length x 1.2cm width x 3.5cm depth; 0.471cm^2 area and 1.649cm^3 volume. There is Fat Layer (Subcutaneous Tissue) exposed. There is no tunneling or undermining noted. There is a medium amount of serous drainage noted. The wound margin is well defined and not attached to the wound base. There is large (67-100%) red granulation within the wound bed. There is a small (1-33%) amount of necrotic tissue within the wound bed including Adherent Slough. The periwound skin appearance had no  abnormalities noted for texture. The periwound skin appearance had no abnormalities noted for moisture. The periwound skin appearance had no abnormalities noted for color. Periwound temperature was noted as No Abnormality. The periwound has tenderness on palpation. Wound #4 status is Open. Original cause of wound was Pressure Injury. The date acquired was: 12/23/2021. The wound has been in treatment 32 weeks. The wound is located on the Left,Lateral Ankle. The wound measures 0.9cm length x 0.6cm width x 0.2cm depth; 0.424cm^2 area and 0.085cm^3 volume. There is tendon and Fat Layer (Subcutaneous Tissue) exposed. There is no tunneling or undermining noted. There is a medium amount of serosanguineous drainage noted. The wound margin is distinct with the outline attached to the wound base. There is small (1-33%) pink granulation within the wound bed. There is a large (67-100%) amount of necrotic tissue within the wound bed including Adherent Slough. The periwound skin appearance had no abnormalities noted for texture. The periwound skin appearance had no abnormalities noted for moisture. The periwound skin appearance  had no abnormalities noted for color. Periwound temperature was noted as No Abnormality. The periwound has tenderness on palpation. 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 There was a Selective/Open Wound Non-Viable Tissue Debridement with a total area of 0.92 sq cm performed by  Duanne Guess, MD. With the following instrument(s): Curette to remove Non-Viable tissue/material. Material removed includes [facility] after achieving pain control using Lidocaine 4% Topical Solution. No specimens were taken. A time out was conducted at 13:55, prior to the start of the procedure. There was no bleeding. The procedure was tolerated well with a pain level of 0 throughout and a pain level of 0 following the procedure. Post Debridement Measurements: 1.3cm length x 0.9cm width x 0.1cm depth; 0.092cm^3 volume. Post debridement Stage noted as Category/Stage III. Character of Wound/Ulcer Post Debridement is improved. Post procedure Diagnosis Wound #1: Same as Pre-Procedure General Notes: scribed for Dr. Lady Gary by Zenaida Deed, RN. Pre-procedure diagnosis of Wound #1 is a Pressure Ulcer located on the Right,Medial Malleolus. A skin graft procedure using a bioengineered skin substitute/cellular or tissue based product was performed by Duanne Guess, MD. [place] Pink tri-layer was applied and secured with Steri-Strips. 3 sq cm of product was utilized and 2 sq cm was wasted due to wound size. Post Application, sorbact, gauze was applied. A Time Out was conducted at 14:00, prior to the start of the procedure. The procedure was tolerated well with a pain level of 0 throughout and a pain level of 0 following the procedure. Post procedure Diagnosis Wound #1: Same as Pre-Procedure General Notes: scribed for Dr. Lady Gary by Zenaida Deed, RN. Wound #4 Pre-procedure diagnosis of Wound #4 is a Pressure Ulcer located on the Left,Lateral Ankle . There was a Selective/Open Wound Non-Viable Tissue Debridement with a total area of 0.42 sq cm performed by Duanne Guess, MD. With the following instrument(s): Curette to remove Non-Viable tissue/material. Material removed includes [facility] after achieving pain control using Lidocaine 4% Topical Solution. No specimens were taken. A time out was conducted at 13:55,  prior to the start of the procedure. There was no bleeding. The procedure was tolerated well with a pain level of 0 throughout and a pain level of 0 following the procedure. Post Debridement Measurements: 0.9cm length x 0.6cm width x 0.2cm depth; 0.085cm^3 volume. Post debridement Stage noted as Category/Stage IV. Character of Wound/Ulcer Post Debridement is improved. Post procedure Diagnosis Wound #4: Same as Pre-Procedure General Notes: scribed for Dr. Lady Gary by Zenaida Deed, RN. Pre-procedure diagnosis of Wound #4 is a Pressure Ulcer located on the Left,Lateral Ankle. A skin graft procedure using a bioengineered skin substitute/cellular or tissue based product was performed by Duanne Guess, MD with the following instrument(s): Forceps and Scissors. Oasis tri-layer was applied and secured with Steri-Strips. 2 sq cm of product was utilized and 3 sq cm was wasted due to wound size. Post Application, sorbact, gauze was applied. A Time Out was conducted at 14:00, prior to the start of the procedure. The procedure was tolerated well with a pain level of 0 throughout and a pain level of 0 following the procedure. Post procedure Diagnosis  Wound #4: Same as Pre-Procedure General Notes: scribed for Dr. Lady Gary by Zenaida Deed, RN. Plan Follow-up Appointments: Return Appointment in 1 week. - Dr. Lady Gary - Room 1 Tuesday 7/30 @ 1:15 pm Anesthetic: (In clinic) Topical Lidocaine 4% applied to wound bed Cellular or Tissue Based Products: Wound #1 Right,Medial Malleolus: Cellular or Tissue Based Product Type: - oasis tri-layer #5 Cellular or Tissue Based Product applied to wound bed, secured with steri-strips, cover with Adaptic or Mepitel. (DO NOT REMOVE). Wound #4 Left,Lateral Ankle: Cellular or Tissue Based Product Type: - oasis tri-layer #5 Cellular or Tissue Based Product applied to wound bed, secured with steri-strips, cover with Adaptic or Mepitel. (DO NOT REMOVE). Bathing/ Shower/  Hygiene: May shower with protection but do not get wound dressing(s) wet. Protect dressing(s) with water repellant cover (for example, large plastic bag) or a cast cover and may then take shower. Off-Loading: Multipodus Splint to: - prevalon boot to both feet Turn and reposition every 2 hours Home Health: No change in wound care orders this week; continue Home Health for wound care. May utilize formulary equivalent dressing for wound treatment orders unless otherwise specified. - may chage outer dressing only on ankles, DO NOT remove sterostrips Dressing changes to be completed by Home Health on Monday / Wednesday / Friday except when patient has scheduled visit at [facility]. Other Home Health Orders/Instructions: - [facility] WOUND #1: - Malleolus Wound Laterality: Right, Medial Cleanser: Normal Saline (Generic) 1 x Per Week/30 Days Discharge Instructions: Cleanse the wound with Normal Saline prior to applying a clean dressing using gauze sponges, not tissue or cotton balls. Cleanser: Byram Ancillary Kit - 15 Day Supply (Generic) 1 x Per Week/30 Days ERYKAH, LIPPERT A ([id number]) [phone].pdf Page 12 of 15 Discharge Instructions: Use supplies as instructed; Kit contains: (15) Saline Bullets; (15) 3x3 Gauze; 15 pr Gloves Peri-Wound Care: Skin Prep (Generic) 1 x Per Week/30 Days Discharge Instructions: Use skin prep as directed Prim Dressing: Cutimed Sorbact Swab 1 x Per Week/30 Days ary Discharge Instructions: Apply to wound bed as instructed Prim Dressing: Oasis tri-layer 1 x Per Week/30 Days ary Secondary Dressing: Woven Gauze Sponge, Non-Sterile 4x4 in 1 x Per Week/30 Days Discharge Instructions: Apply over primary dressing as directed. Secured With: [employer], 4.5x3.1 (in/yd) 1 x Per Week/30 Days Discharge Instructions: Secure with Kerlix as directed. Secured With: 56M Medipore Scientist, research (life sciences) Surgical T 2x10 (in/yd) (Generic) 1 x Per Week/30  Days ape Discharge Instructions: Secure with tape as directed. WOUND #2: - Gluteus Wound Laterality: Right Cleanser: Normal Saline (Generic) 1 x Per Day/30 Days Discharge Instructions: Cleanse the wound with Normal Saline prior to applying a clean dressing using gauze sponges, not tissue or cotton balls. Peri-Wound Care: Skin Prep (Generic) 1 x Per Day/30 Days Discharge Instructions: Use skin prep as directed Prim Dressing: Dakin's Solution 0.25%, 16 (oz) (Generic) 1 x Per Day/30 Days ary Discharge Instructions: Moisten gauze with Dakin's solution Prim Dressing: Promogran Prisma Matrix, 4.34 (sq in) (silver collagen) (Dispense As Written) 1 x Per Day/30 Days ary Discharge Instructions: Moisten collagen with saline or hydrogel Prim Dressing: cotton tipped applicators (Generic) 1 x Per Day/30 Days ary Discharge Instructions: use to pack wound Secondary Dressing: Zetuvit Plus Silicone Border Dressing 5x5 (in/in) (Generic) 1 x Per Day/30 Days Discharge Instructions: Apply silicone border over primary dressing as directed. Secured With: 56M Medipore H Soft Cloth Surgical T ape, 4 x 10 (in/yd) (Generic) 1 x Per Day/30 Days Discharge Instructions: Secure with tape as  directed. WOUND #4: - Ankle Wound Laterality: Left, Lateral Cleanser: Normal Saline (Generic) 1 x Per Week/30 Days Discharge Instructions: Cleanse the wound with Normal Saline prior to applying a clean dressing using gauze sponges, not tissue or cotton balls. Cleanser: Byram Ancillary Kit - 15 Day Supply (Generic) 1 x Per Week/30 Days Discharge Instructions: Use supplies as instructed; Kit contains: (15) Saline Bullets; (15) 3x3 Gauze; 15 pr Gloves Peri-Wound Care: Skin Prep (Generic) 1 x Per Week/30 Days Discharge Instructions: Use skin prep as directed Prim Dressing: Cutimed Sorbact Swab 1 x Per Week/30 Days ary Discharge Instructions: Apply to wound bed as instructed Prim Dressing: Oasis tri-layer 1 x Per Week/30  Days ary Secondary Dressing: Woven Gauze Sponge, Non-Sterile 4x4 in 1 x Per Week/30 Days Discharge Instructions: Apply over primary dressing as directed. Secured With: [employer], 4.5x3.1 (in/yd) 1 x Per Week/30 Days Discharge Instructions: Secure with Kerlix as directed. Secured With: 1M Medipore Scientist, research (life sciences) Surgical T 2x10 (in/yd) (Generic) 1 x Per Week/30 Days ape Discharge Instructions: Secure with tape as directed. 08/16/2022: The buttock ulcer cavity has contracted further and she has less tenderness at the deepest aspect of the wound. Both ankle wounds look smaller today, although they did not measure any differently. Minimal slough accumulation with fairly healthy-looking granulation tissue present. The buttock ulcer did not require any debridement today. We will continue Prisma silver collagen with Dakin's-moistened gauze packing. I debrided slough off of both the ankle wounds. Oasis Tri layer was rehydrated with saline and applied to both wounds. It was secured with Sorbact and Steri-Strips. She will follow- up in 1 week. Electronic Signature(s) Signed: 08/16/2022 2:12:06 PM By: Duanne Guess MD FACS Entered By: Duanne Guess on 08/16/2022 14:12:06 -------------------------------------------------------------------------------- HxROS Details Patient Name: Date of Service: Andrea Yu 08/16/2022 1:15 PM Medical Record Number: [id number] Patient Account Number: [id number] Date of Birth/Sex: Treating RN: 1941/01/23 (81 y.o. Andrea Yu Primary Care Provider: Jarome Matin Other Clinician: Referring Provider: Treating Provider/Extender: Marena Chancy in Treatment: 37 Information Obtained From Patient Caregiver Chart Constitutional Symptoms (General Health) Medical History: Past Medical History Notes: morbid obesity JERNEE, MURTAUGH A ([id number]) [id number] Page 13 of 15 Eyes Medical  History: Positive for: Cataracts - bil extracted Negative for: Glaucoma; Optic Neuritis Ear/Nose/Mouth/Throat Medical History: Past Medical History Notes: hard of hearing Cardiovascular Medical History: Positive for: Arrhythmia - afib; Hypertension Gastrointestinal Medical History: Past Medical History Notes: GERD Endocrine Medical History: Negative for: Type I Diabetes; Type II Diabetes Past Medical History Notes: adrenal insufficiency Genitourinary Medical History: Negative for: End Stage Renal Disease Integumentary (Skin) Medical History: Negative for: History of Burn Musculoskeletal Medical History: Positive for: Osteoarthritis; Osteomyelitis - pelvic Past Medical History Notes: psoriatic arthritis, thoracic discitis, displaced spiral fx right tibia Neurologic Medical History: Positive for: Neuropathy Past Medical History Notes: hx [facility] Oncologic Medical History: Negative for: Received Chemotherapy; Received Radiation Psychiatric Medical History: Positive for: Confinement Anxiety Negative for: Anorexia/bulimia HBO Extended History Items Eyes: Cataracts Immunizations Pneumococcal Vaccine: Received Pneumococcal Vaccination: Yes Received Pneumococcal Vaccination On or After 60th Birthday: Yes Implantable Devices None CADIENCE, BRADFIELD A ([id number]) [id number] Page 14 of 15 Hospitalization / Surgery History Type of Hospitalization/Surgery TEE right ankle fusion bil knee replacements bil cataract extractions posterior lumbar fusion vaginal hysterectomy cholecystectomy Family and Social History Cancer: Yes - Father,Maternal Grandparents,Siblings,Child; Diabetes: No; Heart Disease: Yes - Father; Hereditary Spherocytosis: No; Hypertension: Yes - Father; Kidney Disease: No; Lung Disease: No; Seizures: No; Stroke: No; Thyroid Problems: Yes -  Mother; Tuberculosis: No; Former smoker - quit 40 yr ago; Marital Status - Widowed; Alcohol Use:  Never; Drug Use: No History; Caffeine Use: Daily - tea, soda; Financial Concerns: No; Food, Clothing or Shelter Needs: No; Support System Lacking: No; Transportation Concerns: No Psychologist, prison and probation services) Signed: 08/16/2022 3:58:48 PM By: Duanne Guess MD FACS Signed: 08/16/2022 6:34:47 PM By: Zenaida Deed RN, BSN Entered By: Duanne Guess on 08/16/2022 14:10:51 -------------------------------------------------------------------------------- SuperBill Details Patient Name: Date of Service: Andrea Yu 08/16/2022 Medical Record Number: [id number] Patient Account Number: [id number] Date of Birth/Sex: Treating RN: May 22, 1940 (81 y.o. 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 Long term (current) use of systemic steroids I10 Essential (primary) hypertension Facility Procedures : CPT4: Code [id number] C o Description: 5271 Application of skin substitute graft to trunk, arms, legs, total wound surface area up to 100 sq cm; first 25 sqcm r less wound surface area ICD-10 Diagnosis Description 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, stage 3 L89.523 Pressure  ulcer of left ankle, stage 3 Modifier: Quantity: 1 : CPT4: [id number] Q Description: 4124 [employer] Ultra per sq CM Modifier: Quantity: 10 Physician Procedures : Essie Christine: Description Modifier Code [id number] - WC PHYS LEVEL 4 - EST PT 25 ICD-10 Diagnosis Description 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, stage 3 L89.523 Pressure ulcer of left ankle, stage 3  L98.415 Non-pressure chronic ulcer of buttock with  muscle involvement without evidence of necrosis L02.31 Cutaneous abscess of buttock Brandi A ([id number]) [id number] Quantity: 1 7.pdf Page 15 of 15 : CPT4: [phone] Application of skin substitute graft to trunk, arms, legs, total wound surface area up to 100 sq cm; first 25 sqcm or 1 less wound surface area ICD-10 Diagnosis Description 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

## 2022-08-24 ENCOUNTER — Encounter (HOSPITAL_BASED_OUTPATIENT_CLINIC_OR_DEPARTMENT_OTHER): Payer: Medicare Other | Admitting: General Surgery

## 2022-08-24 DIAGNOSIS — L97812 Non-pressure chronic ulcer of other part of right lower leg with fat layer exposed: Secondary | ICD-10-CM | POA: Diagnosis not present

## 2022-08-24 NOTE — Progress Notes (Signed)
KARSTYN, KIE A ([id number]) [id number] Page 1 of 14 Visit Report for 08/24/2022 Chief Complaint Document Details Patient Name: Date of Service: Andrea Yu 08/24/2022 1:15 PM Medical Record Number: [id number] Patient Account Number: [id number] Date of Birth/Sex: Treating RN: [birth year] (82 y.o. F) Primary Care Provider: Jarome Matin Other Clinician: Referring Provider: Treating Provider/Extender: Marena Chancy in Treatment: 39 Information Obtained from: Patient Chief Complaint Patient is at the clinic for treatment of an open pressure ulcer on her right medial ankle, and a large abscess on her right buttock Electronic Signature(s) Signed: 08/24/2022 2:31:59 PM By: Duanne Guess MD FACS Entered By: Duanne Guess on 08/24/2022 14:31:59 -------------------------------------------------------------------------------- Cellular or Tissue Based Product Details Patient Name: Date of Service: Andrea Yu 08/24/2022 1:15 PM Medical Record Number: [id number] Patient Account Number: [id number] Date of Birth/Sex: Treating RN: 13-Feb-1940 (82 y.o. F) Primary Care Provider: Jarome Matin Other Clinician: Referring Provider: Treating Provider/Extender: Marena Chancy in Treatment: 39 Cellular or Tissue Based Product Type Wound #1 Right,Medial Malleolus Applied to: Performed By: Physician Duanne Guess, MD Cellular or Tissue Based Product Type: Oasis tri-layer Level of Consciousness (Pre-procedure): Awake and Alert Pre-procedure Verification/Time Out Yes - 14:15 Taken: Location: trunk / arms / legs Wound Size (sq cm): 0.88 Product Size (sq cm): 5 Waste Size (sq cm): 2.5 Waste Reason: wound size Amount of Product Applied (sq cm): 2.5 Instrument Used: Forceps, Scissors Lot #: [id number] Order #: 6 Expiration Date: 05/01/2024 Fenestrated: No Reconstituted: Yes Solution Type:  saline Solution Amount: 2 ml Lot #: [id number] KS Solution Expiration Date: 02/16/2024 Secured: Yes Secured With: Steri-Strips Dressing Applied: Yes Primary Dressing: sorbact, gauze Procedural Pain: 0 Post Procedural Pain: 0 Response to Treatment: Procedure was tolerated well JADIN, GARBISCH A ([id number]) [id number] Page 2 of 14 Level of Consciousness (Post- Awake and Alert procedure): Post Procedure Diagnosis Same as Pre-procedure Notes scribed for Dr. Lady Gary by Zenaida Deed, RN Electronic Signature(s) Signed: 08/24/2022 2:46:03 PM By: Duanne Guess MD FACS Previous Signature: 08/24/2022 2:45:43 PM Version By: Duanne Guess MD FACS Entered By: Duanne Guess on 08/24/2022 14:46:02 -------------------------------------------------------------------------------- Cellular or Tissue Based Product Details Patient Name: Date of Service: Andrea Yu 08/24/2022 1:15 PM Medical Record Number: [id number] Patient Account Number: [id number] Date of Birth/Sex: Treating RN: 1940-04-06 (81 y.o. F) Primary Care Provider: Jarome Matin Other Clinician: Referring Provider: Treating Provider/Extender: Marena Chancy in Treatment: 39 Cellular or Tissue Based Product Type Wound #4 Left,Lateral Ankle Applied to: Performed By: Physician Duanne Guess, MD Cellular or Tissue Based Product Type: Oasis tri-layer Level of Consciousness (Pre-procedure): Awake and Alert Pre-procedure Verification/Time Out Yes - 14:15 Taken: Location: trunk / arms / legs Wound Size (sq cm): 0.56 Product Size (sq cm): 5 Waste Size (sq cm): 2.5 Waste Reason: wound size Amount of Product Applied (sq cm): 2.5 Instrument Used: Forceps, Scissors Lot #: [id number] Order #: 6 Expiration Date: 05/01/2024 Fenestrated: No Reconstituted: Yes Solution Type: saline Solution Amount: 2 ml Lot #: [id number] KS Solution Expiration Date: 02/16/2024 Secured: Yes Secured  With: Steri-Strips Dressing Applied: Yes Primary Dressing: sorbact, gauze Procedural Pain: 0 Post Procedural Pain: 0 Response to Treatment: Procedure was tolerated well Level of Consciousness (Post- Awake and Alert procedure): Post Procedure Diagnosis Same as Pre-procedure Notes scribed for Dr. Lady Gary by Zenaida Deed, RN Electronic Signature(s) Signed: 08/24/2022 2:46:14 PM By: Duanne Guess MD FACS Entered By: Duanne Guess on 08/24/2022 14:46:14 [name] ([id number]) [id number] Page 3 of 14 -------------------------------------------------------------------------------- Debridement  Details Patient Name: Date of Service: Andrea Yu 08/24/2022 1:15 PM Medical Record Number: [id number] Patient Account Number: [id number] Date of Birth/Sex: Treating RN: Nov 22, 1940 (82 y.o. F) Primary Care Provider: Jarome Matin Other Clinician: Referring Provider: Treating Provider/Extender: Marena Chancy in Treatment: 39 Debridement Performed for Assessment: Wound #4 Left,Lateral Ankle Performed By: Physician Duanne Guess, MD Debridement Type: Debridement Level of Consciousness (Pre-procedure): Awake and Alert Pre-procedure Verification/Time Out Yes - 14:15 Taken: Start Time: 14:16 Pain Control: Lidocaine 4% T opical Solution Percent of Wound Bed Debrided: 100% T Area Debrided (cm): otal 0.44 Tissue and other material debrided: Non-Viable, Slough, Slough Level: Non-Viable Tissue Debridement Description: Selective/Open Wound Instrument: Curette Bleeding: Minimum Hemostasis Achieved: Pressure Procedural Pain: 0 Post Procedural Pain: 0 Response to Treatment: Procedure was tolerated well Level of Consciousness (Post- Awake and Alert procedure): Post Debridement Measurements of Total Wound Length: (cm) 0.8 Stage: Category/Stage IV Width: (cm) 0.7 Depth: (cm) 0.2 Volume: (cm) 0.088 Character of Wound/Ulcer  Post Debridement: Improved Post Procedure Diagnosis Same as Pre-procedure Notes scribed for Dr. Lady Gary by Zenaida Deed, RN Electronic Signature(s) Signed: 08/24/2022 2:45:27 PM By: Duanne Guess MD FACS Entered By: Duanne Guess on 08/24/2022 14:45:27 -------------------------------------------------------------------------------- HPI Details Patient Name: Date of Service: Andrea Yu 08/24/2022 1:15 PM Medical Record Number: [id number] Patient Account Number: [id number] Date of Birth/Sex: Treating RN: January 25, 1941 (81 y.o. 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 She is not diabetic and quit smoking over 40 years ago. 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 She has developed a pressure ulcer on the right medial malleolus. In addition, she apparently has had multiple cutaneous abscesses on her buttocks that have required repeated incision and drainage procedures. She has developed a large abscess on her right buttock that has become painful. She was recently treated with cephalexin by her PCP for urinary tract infection and also received a shot of Rocephin for the abscess, but it has not yet been incised or drained. She is nonambulatory but is able to stand to transfer. She does not wear any sort of Prevalon boot. ABI in clinic today was 0.96. On her right medial malleolus, there is a circular ulcer. The surface is dry and fibrotic. There is some periwound erythema, but no malodor or purulent drainage. On her right buttock, there is a  large fluctuant bulge about the size of an orange. It is also erythematous and tender. 12/04/2021: The culture from her abscess grew out staph. A 10-day course of Bactrim was prescribed and she is currently taking this. Unfortunately, Dakin's solution was not delivered and only 2 x 2 gauze was sent, rather than Kerlix so the packing of the wound has been rather suboptimal. The wound cavity has light slough over all of the surfaces. Although covered, bone is palpable. The medial ankle wound looks about the same with a layer of slough accumulation. In addition, her daughter peeled some dry skin off of her foot this morning and she now has denuded areas over the majority of the plantar surface of her right foot. 01/01/2022: The patient has missed several visits and to her daughters report that it  is somewhat difficult to get her here on a weekly basis. In the interim, she has developed a new pressure ulcer on her left lateral malleolus. The plantar surface of her right foot has healed. The cavity on her right buttock has contracted, but it remains quite deep and the trochanter, while not exposed, is palpable at the base. The pressure ulcer on her right medial malleolus is basically unchanged. 02/09/2022: All wounds are little bit smaller today. As the buttock abscess cavity has contracted, it has left some tunneling that has not been adequately packed. Light slough accumulation on both ankle wounds. 02/26/2022: All of the wounds continue to contract. There is slough on both ankle wounds. The buttock ulcer is clean. She brought her wound VAC with her today. 03/12/2022: The ankle wounds are about the same this week with slough accumulation. The buttock ulcer is smaller and quite clean. She decided that she could not tolerate the discomfort of the wound VAC and removed it. They have been packing the wound with Dakin's moistened gauze. 03/25/2022: The ankle wounds are slightly smaller. Both have slough  accumulation. The orifice of the buttock ulcer has gotten quite a bit smaller than the underlying cavity. She has decided that she would like to have the wound VAC back. 3/14; patient presents for follow-up. She has bilateral ankle wounds and has been using Hydrofera Blue to the these wound beds. She has Prevalon boots to help with offloading. She also has a sacral wound that we have been using a wound VAC for. She has no issues or complaints today. 04/29/2022: She once again decided she did not like the wound VAC and so they have been packing her gluteal wound with Dakin's moistened gauze. The gluteal ulcer seems to be about the same. Her left lateral ankle wound has gotten deeper and larger. The surface tissue is gray with thick slough. No significant change to the right medial ankle wound. 05/18/2022: The depth on the gluteal ulcer has come in a bit. There was a very strong pungent odor when the packing was removed, however. The right medial ankle wound is about the same size, but the tissue surface looks healthier. The left lateral ankle wound has reaccumulated the rubbery gray slough. 05/31/2022: The gluteal ulcer is shallower and no longer has any dips or crevices. The odor has abated completely. The right medial ankle wound is unchanged. The left lateral ankle wound looks quite a bit healthier with a cleaner surface and the beginnings of granulation tissue emergence. 06/16/2022: The gluteal ulcer has the same measurements more or less, but overall the volume seems smaller, as the cavity feels tighter on examination. The ankle wounds are basically the same. 07/12/2022: The cavity of the buttock ulcer continues to contract. The left ankle wound has a little bit more granulation tissue, but bone remains exposed. The right ankle wound is flush with the surrounding skin. 07/21/2022: There is 1 deeper area in the buttock ulcer that I have not appreciated previously. I can feel bone under a layer of tissue.  The rest of the cavity has contracted. Both ankle wounds are filling in with better granulation tissue. There is slough on both ankle wound surfaces. She struck her leg on her bed frame and has 2 new wounds on her right anterior tibial surface. Both have fat layer exposure but no concern for infection. 08/02/2022: I do not feel bone as easily in the buttock ulcer. There is slough on the surface. The cavity seems to be contracting.  Both ankle wounds have improved tissue quality and there is granulation tissue nearly completely covering the bone on the left lateral malleolus. The wounds on her right anterior tibial surface have dried up. 08/10/2022: The cavity at the buttock ulcer has contracted further. The depth remains about the same. The surface is cleaner this week. Both ankle wounds are little bit smaller today. They have rubbery slough on the surface which appears to be leftover Oasis. Underneath, the quality of the tissue continues to improve and there is no longer any bone exposed on the left lateral malleolus. 08/16/2022: The buttock ulcer cavity has contracted further and she has less tenderness at the deepest aspect of the wound. Both ankle wounds look smaller today, although they did not measure any differently. Minimal slough accumulation with fairly healthy-looking granulation tissue present. 08/24/2022: The buttock ulcer cavity is smaller again today. She no longer has the tenderness at the deepest aspect of the wound. Both ankle wounds measured about a millimeter smaller. There is healthy-looking granulation tissue present bilaterally with minimal slough on the left and no slough seen on the right. Electronic Signature(s) Signed: 08/24/2022 2:32:50 PM By: Duanne Guess MD FACS Entered By: Duanne Guess on 08/24/2022 14:32:50 -------------------------------------------------------------------------------- Physical Exam Details Patient Name: Date of Service: Andrea Yu  08/24/2022 1:15 PM Medical Record Number: [id number] Patient Account Number: [id number] Date of Birth/Sex: Treating RN: 1940-10-20 (82 y.o. TIAIRRA, COONRADT A ([id number]) [id number] Page 5 of 14 Primary Care Provider: Jarome Matin Other Clinician: Referring Provider: Treating Provider/Extender: Marena Chancy in Treatment: 39 Constitutional Slightly hypertensive. . . . no acute distress. Respiratory Normal work of breathing on room air. Notes 08/24/2022: The buttock ulcer cavity is smaller again today. She no longer has the tenderness at the deepest aspect of the wound. Both ankle wounds measured about a millimeter smaller. There is healthy-looking granulation tissue present bilaterally with minimal slough on the left and no slough seen on the right. Electronic Signature(s) Signed: 08/24/2022 2:33:32 PM By: Duanne Guess MD FACS Entered By: Duanne Guess on 08/24/2022 14:33:32 -------------------------------------------------------------------------------- Physician Orders Details Patient Name: Date of Service: Andrea Yu 08/24/2022 1:15 PM Medical Record Number: [id number] Patient Account Number: [id number] Date of Birth/Sex: Treating RN: 16-May-1940 (82 y.o. 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 - Dr. Lady Gary - Room 1 Return A Tuesday 8/6 @ 1:15 pm Anesthetic (In clinic) Topical Lidocaine 4% applied to wound bed Cellular or Tissue Based Products Wound #1 Right,Medial Malleolus Cellular or Tissue Based Product Type: - oasis tri-layer #6 Cellular or Tissue Based Product applied to wound bed, secured with steri-strips, cover with Adaptic or Mepitel. (DO NOT REMOVE). Wound #4 Left,Lateral Ankle Cellular or Tissue Based Product Type: - oasis tri-layer #6 Cellular or Tissue Based Product applied to wound bed, secured with steri-strips, cover with Adaptic or Mepitel. (DO NOT REMOVE). Bathing/ Shower/ Hygiene May shower with protection but do not get wound dressing(s) wet. Protect dressing(s) with water repellant cover (for example, large plastic  bag) or a cast cover and may then take shower. Off-Loading Multipodus Splint to: - prevalon boot to both feet Turn and reposition every 2 hours Home Health ARLETHE, GREENHILL A ([id number]) [id number] Page 6 of 14 No change in wound care orders this week; continue Home Health for wound care. May utilize formulary equivalent dressing for wound treatment orders unless otherwise specified. - may chage outer dressing only on ankles, DO NOT remove sterostrips Dressing changes to be completed by Home Health on Monday / Wednesday / Friday except when patient has scheduled visit at [facility] Other Home Health Orders/Instructions: - [facility] Wound Treatment Wound #1 - Malleolus Wound Laterality: Right, Medial Cleanser: Normal Saline (Generic) 1 x Per Week/30 Days Discharge Instructions: Cleanse the wound with Normal Saline prior to applying a clean dressing using gauze sponges, not tissue or cotton balls. Cleanser: Byram Ancillary Kit - 15 Day Supply (Generic) 1 x Per Week/30 Days Discharge Instructions: Use supplies as instructed; Kit contains: (15) Saline Bullets; (15) 3x3 Gauze; 15 pr Gloves Peri-Wound Care: Skin  Prep (Generic) 1 x Per Week/30 Days Discharge Instructions: Use skin prep as directed Prim Dressing: Cutimed Sorbact Swab 1 x Per Week/30 Days ary Discharge Instructions: Apply to wound bed as instructed Prim Dressing: Oasis tri-layer ary 1 x Per Week/30 Days Secondary Dressing: Woven Gauze Sponge, Non-Sterile 4x4 in 1 x Per Week/30 Days Discharge Instructions: Apply over primary dressing as directed. Secured With: [employer], 4.5x3.1 (in/yd) 1 x Per Week/30 Days Discharge Instructions: Secure with Kerlix as directed. Secured With: 12M Medipore Scientist, research (life sciences) Surgical T 2x10 (in/yd) (Generic) 1 x Per Week/30 Days ape Discharge Instructions: Secure with tape as directed. Wound #2 - Gluteus Wound Laterality: Right Cleanser: Normal Saline (Generic) 1 x Per Day/30 Days Discharge Instructions: Cleanse the wound with Normal Saline prior to applying a clean dressing using gauze sponges, not tissue or cotton balls. Peri-Wound Care: Skin Prep (Generic) 1 x Per Day/30 Days Discharge Instructions: Use skin prep as directed Prim Dressing: Dakin's Solution 0.25%, 16 (oz) (Generic) 1 x Per Day/30 Days ary Discharge Instructions: Moisten gauze with Dakin's solution Prim Dressing: Promogran Prisma Matrix, 4.34 (sq in) (silver collagen) (Dispense As Written) 1 x Per Day/30 Days ary Discharge Instructions: Moisten collagen with saline or hydrogel Prim Dressing: cotton tipped applicators (Generic) 1 x Per Day/30 Days ary Discharge Instructions: use to pack wound Secondary Dressing: Zetuvit Plus Silicone Border Dressing 5x5 (in/in) (Generic) 1 x Per Day/30 Days Discharge Instructions: Apply silicone border over primary dressing as directed. Secured With: 12M Medipore H Soft Cloth Surgical T ape, 4 x 10 (in/yd) (Generic) 1 x Per Day/30 Days Discharge Instructions: Secure with tape as directed. Wound #4 - Ankle Wound Laterality: Left, Lateral Cleanser: Normal Saline (Generic) 1 x Per Week/30  Days Discharge Instructions: Cleanse the wound with Normal Saline prior to applying a clean dressing using gauze sponges, not tissue or cotton balls. Cleanser: Byram Ancillary Kit - 15 Day Supply (Generic) 1 x Per Week/30 Days Discharge Instructions: Use supplies as instructed; Kit contains: (15) Saline Bullets; (15) 3x3 Gauze; 15 pr Gloves Peri-Wound Care: Skin Prep (Generic) 1 x Per Week/30 Days Discharge Instructions: Use skin prep as directed Prim Dressing: Cutimed Sorbact Swab 1 x Per Week/30 Days ary Discharge Instructions: Apply to wound bed as instructed Prim Dressing: Oasis tri-layer ary 1 x Per Week/30 Days Secondary Dressing: Woven Gauze Sponge, Non-Sterile 4x4 in 1 x Per Week/30 Days Discharge Instructions: Apply over primary dressing as directed. Secured  With: Kerlix Roll Sterile, 4.5x3.1 (in/yd) 1 x Per Week/30 Days Discharge Instructions: Secure with Kerlix as directed. Secured With: 29M Medipore Scientist, research (life sciences) Surgical T 2x10 (in/yd) (Generic) 1 x Per Week/30 Days ape Discharge Instructions: Secure with tape as directed. CADY, STICKEL A ([id number]) [id number] Page 7 of 14 Electronic Signature(s) Signed: 08/24/2022 3:21:59 PM By: Duanne Guess MD FACS Entered By: Duanne Guess on 08/24/2022 14:34:49 -------------------------------------------------------------------------------- Problem List Details Patient Name: Date of Service: Andrea Yu 08/24/2022 1:15 PM Medical Record Number: [id number] Patient Account Number: [id number] Date of Birth/Sex: Treating RN: Jun 16, 1940 (82 y.o. 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 01/01/2022 No Yes evidence of necrosis L02.31 Cutaneous abscess of buttock 11/24/2021 No Yes E27.40 Unspecified adrenocortical insufficiency 11/24/2021 No Yes Z79.52 Long term (current) use of systemic steroids 11/24/2021 No Yes I10 Essential (primary) hypertension 11/24/2021 No Yes Inactive Problems ICD-10 Code Description Active Date Inactive Date L97.812 Non-pressure chronic ulcer of other part of right lower leg with fat layer exposed 07/21/2022 07/21/2022 Resolved Problems ICD-10 Code Description Active Date Resolved Date L97.511 Non-pressure chronic ulcer of other part of right foot limited to breakdown of skin 12/04/2021 12/04/2021 DAJIA, CHAMBLEY A ([id number]) [id number] Page 8 of 14 Electronic Signature(s) Signed: 08/24/2022 2:31:15 PM By: Duanne Guess MD FACS Entered By: Duanne Guess on 08/24/2022 14:31:15 -------------------------------------------------------------------------------- Progress Note Details Patient Name: Date of Service: Andrea Yu 08/24/2022 1:15 PM Medical Record Number: [id number] Patient Account Number: [id number] Date of Birth/Sex: Treating RN: 1940/08/07 (82 y.o. 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 She is not diabetic and quit smoking over 40 years ago. 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 As  result, she has an ankle deformity. She has developed a pressure ulcer on the right medial malleolus. In addition, she apparently has had multiple cutaneous abscesses on her buttocks that have required repeated incision and drainage procedures. She has developed a large abscess on her right buttock that has become painful. She was recently treated with cephalexin by her PCP for urinary tract infection and also received a shot of Rocephin for the abscess, but it has not yet been incised or drained. She is nonambulatory but is able to stand to transfer. She does not wear any sort of Prevalon boot. ABI in clinic today was 0.96. On her right medial malleolus, there is a circular ulcer. The surface is dry and fibrotic. There is some periwound erythema, but no malodor or purulent drainage. On her right buttock, there is a large fluctuant bulge about the size of an orange. It is also erythematous and tender. 12/04/2021: The culture from her abscess grew out staph. A 10-day course of Bactrim was prescribed and she is currently taking this. Unfortunately, Dakin's  solution was not delivered and only 2 x 2 gauze was sent, rather than Kerlix so the packing of the wound has been rather suboptimal. The wound cavity has light slough over all of the surfaces. Although covered, bone is palpable. The medial ankle wound looks about the same with a layer of slough accumulation. In addition, her daughter peeled some dry skin off of her foot this morning and she now has denuded areas over the majority of the plantar surface of her right foot. 01/01/2022: The patient has missed several visits and to her daughters report that it is somewhat difficult to get her here on a weekly basis. In the interim, she has developed a new pressure ulcer on her left lateral malleolus. The plantar surface of her right foot has healed. The cavity on her right buttock has contracted, but it remains quite deep and the trochanter, while not  exposed, is palpable at the base. The pressure ulcer on her right medial malleolus is basically unchanged. 02/09/2022: All wounds are little bit smaller today. As the buttock abscess cavity has contracted, it has left some tunneling that has not been adequately packed. Light slough accumulation on both ankle wounds. 02/26/2022: All of the wounds continue to contract. There is slough on both ankle wounds. The buttock ulcer is clean. She brought her wound VAC with her today. 03/12/2022: The ankle wounds are about the same this week with slough accumulation. The buttock ulcer is smaller and quite clean. She decided that she could not tolerate the discomfort of the wound VAC and removed it. They have been packing the wound with Dakin's moistened gauze. 03/25/2022: The ankle wounds are slightly smaller. Both have slough accumulation. The orifice of the buttock ulcer has gotten quite a bit smaller than the underlying cavity. She has decided that she would like to have the wound VAC back. 3/14; patient presents for follow-up. She has bilateral ankle wounds and has been using Hydrofera Blue to the these wound beds. She has Prevalon boots to help with offloading. She also has a sacral wound that we have been using a wound VAC for. She has no issues or complaints today. 04/29/2022: She once again decided she did not like the wound VAC and so they have been packing her gluteal wound with Dakin's moistened gauze. The gluteal ulcer seems to be about the same. Her left lateral ankle wound has gotten deeper and larger. The surface tissue is gray with thick slough. No significant change to the right medial ankle wound. 05/18/2022: The depth on the gluteal ulcer has come in a bit. There was a very strong pungent odor when the packing was removed, however. The right medial ankle wound is about the same size, but the tissue surface looks healthier. The left lateral ankle wound has reaccumulated the rubbery gray  slough. 05/31/2022: The gluteal ulcer is shallower and no longer has any dips or crevices. The odor has abated completely. The right medial ankle wound is unchanged. The left lateral ankle wound looks quite a bit healthier with a cleaner surface and the beginnings of granulation tissue emergence. 06/16/2022: The gluteal ulcer has the same measurements more or less, but overall the volume seems smaller, as the cavity feels tighter on examination. The ankle wounds are basically the same. 07/12/2022: The cavity of the buttock ulcer continues to contract. The left ankle wound has a little bit more granulation tissue, but bone remains exposed. The right ankle wound is flush with the surrounding skin. 07/21/2022: There is  1 deeper area in the buttock ulcer that I have not appreciated previously. I can feel bone under a layer of tissue. The rest of the cavity has contracted. Both ankle wounds are filling in with better granulation tissue. There is slough on both ankle wound surfaces. She struck her leg on her bed frame and has 2 new wounds on her right anterior tibial surface. Both have fat layer exposure but no concern for infection. ADALAE, KAAI A ([id number]) [id number] Page 9 of 14 08/02/2022: I do not feel bone as easily in the buttock ulcer. There is slough on the surface. The cavity seems to be contracting. Both ankle wounds have improved tissue quality and there is granulation tissue nearly completely covering the bone on the left lateral malleolus. The wounds on her right anterior tibial surface have dried up. 08/10/2022: The cavity at the buttock ulcer has contracted further. The depth remains about the same. The surface is cleaner this week. Both ankle wounds are little bit smaller today. They have rubbery slough on the surface which appears to be leftover Oasis. Underneath, the quality of the tissue continues to improve and there is no longer any bone exposed on the left  lateral malleolus. 08/16/2022: The buttock ulcer cavity has contracted further and she has less tenderness at the deepest aspect of the wound. Both ankle wounds look smaller today, although they did not measure any differently. Minimal slough accumulation with fairly healthy-looking granulation tissue present. 08/24/2022: The buttock ulcer cavity is smaller again today. She no longer has the tenderness at the deepest aspect of the wound. Both ankle wounds measured about a millimeter smaller. There is healthy-looking granulation tissue present bilaterally with minimal slough on the left and no slough seen on the right. Patient History Information obtained from Patient, Caregiver, Chart. Family History Cancer - Father,Maternal Grandparents,Siblings,Child, Heart Disease - Father, Hypertension - Father, Thyroid Problems - Mother, No family history of Diabetes, Hereditary Spherocytosis, Kidney Disease, Lung Disease, Seizures, Stroke, Tuberculosis. Social History Former smoker - quit 40 yr ago, Marital Status - Widowed, Alcohol Use - Never, Drug Use - No History, Caffeine Use - Daily - tea, soda. Medical History Eyes Patient has history of Cataracts - bil extracted Denies history of Glaucoma, Optic Neuritis Cardiovascular Patient has history of Arrhythmia - afib, Hypertension Endocrine Denies history of Type I Diabetes, Type II Diabetes Genitourinary Denies history of End Stage Renal Disease Integumentary (Skin) Denies history of History of Burn Musculoskeletal Patient has history of Osteoarthritis, Osteomyelitis - pelvic Neurologic Patient has history of Neuropathy Oncologic Denies history of Received Chemotherapy, Received Radiation Psychiatric Patient has history of Confinement Anxiety Denies history of Anorexia/bulimia Hospitalization/Surgery History - TEE. - right ankle fusion. - bil knee replacements. - bil cataract extractions. - posterior lumbar fusion. - vaginal hysterectomy. -  cholecystectomy. 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 no acute distress. Vitals Time Taken: 1:45 PM, Height: 59 in, Weight: 155 lbs, BMI: 31.3, Temperature: 97.9 F, Pulse: 72 bpm, Respiratory Rate: 18 breaths/min, Blood Pressure: 141/72 mmHg. Respiratory Normal work of breathing on room air. General Notes: 08/24/2022: The buttock ulcer cavity is smaller again today. She no longer has the tenderness at the deepest aspect of the wound. Both ankle wounds measured about a millimeter smaller. There is healthy-looking granulation tissue present bilaterally with minimal slough on the  left and no slough seen on the right. WANONA, YAP A ([id number]) [id number] Page 10 of 14 Integumentary (Hair, Skin) Wound #1 status is Open. Original cause of wound was Pressure Injury. The date acquired was: 11/16/2021. The wound has been in treatment 39 weeks. The wound is located on the Right,Medial Malleolus. The wound measures 1.1cm length x 0.8cm width x 0.1cm depth; 0.691cm^2 area and 0.069cm^3 volume. There is Fat Layer (Subcutaneous Tissue) exposed. There is no tunneling or undermining noted. There is a medium amount of serosanguineous drainage noted. The wound margin is distinct with the outline attached to the wound base. There is medium (34-66%) pink, pale granulation within the wound bed. There is a medium (34-66%) amount of necrotic tissue within the wound bed including Adherent Slough. The periwound skin appearance had no abnormalities noted for texture. The periwound skin appearance had no abnormalities noted for moisture. The periwound skin appearance exhibited: Erythema.  The surrounding wound skin color is noted with erythema. Erythema is measured at 2.5 cm. Periwound temperature was noted as No Abnormality. Wound #2 status is Open. Original cause of wound was Bump. The date acquired was: 09/28/2021. The wound has been in treatment 39 weeks. The wound is located on the Right Gluteus. The wound measures 0.8cm length x 1.7cm width x 3.4cm depth; 1.068cm^2 area and 3.632cm^3 volume. There is Fat Layer (Subcutaneous Tissue) exposed. There is no tunneling noted, however, there is undermining starting at 12:00 and ending at 3:00 with a maximum distance of 2.7cm. There is a medium amount of serous drainage noted. The wound margin is well defined and not attached to the wound base. There is large (67-100%) red granulation within the wound bed. There is a small (1-33%) amount of necrotic tissue within the wound bed including Adherent Slough. The periwound skin appearance had no abnormalities noted for texture. The periwound skin appearance had no abnormalities noted for moisture. The periwound skin appearance had no abnormalities noted for color. Periwound temperature was noted as No Abnormality. The periwound has tenderness on palpation. Wound #4 status is Open. Original cause of wound was Pressure Injury. The date acquired was: 12/23/2021. The wound has been in treatment 33 weeks. The wound is located on the Left,Lateral Ankle. The wound measures 0.8cm length x 0.7cm width x 0.2cm depth; 0.44cm^2 area and 0.088cm^3 volume. There is tendon and Fat Layer (Subcutaneous Tissue) exposed. There is no tunneling or undermining noted. There is a medium amount of serosanguineous drainage noted. The wound margin is distinct with the outline attached to the wound base. There is large (67-100%) red, pale granulation within the wound bed. There is a small (1-33%) amount of necrotic tissue within the wound bed including Adherent Slough. The periwound skin appearance had no abnormalities noted  for texture. The periwound skin appearance had no abnormalities noted for moisture. The periwound skin appearance had no abnormalities noted for color. Periwound temperature was noted as No Abnormality. The periwound has tenderness on palpation. 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 There was a Selective/Open Wound Non-Viable Tissue Debridement with a total area of 0.44 sq cm performed by Duanne Guess, MD. With the following instrument(s): Curette to remove  Non-Viable tissue/material. Material removed includes Slough after achieving pain control using Lidocaine 4% Topical Solution. No specimens were taken. A time out was conducted at 14:15, prior to the start of the procedure. A Minimum amount of bleeding was controlled with Pressure. The procedure was tolerated well with a pain level of 0 throughout and a pain level of 0 following the procedure. Post Debridement Measurements: 0.8cm length x 0.7cm width x 0.2cm depth; 0.088cm^3 volume. Post debridement Stage noted as Category/Stage IV. Character of Wound/Ulcer Post Debridement is improved. Post procedure Diagnosis Wound #4: Same as Pre-Procedure General Notes: scribed for Dr. Lady Gary by Zenaida Deed, RN. Pre-procedure diagnosis of Wound #4 is a Pressure Ulcer located on the Left,Lateral Ankle. A skin graft procedure using a bioengineered skin substitute/cellular or tissue based product was performed by Duanne Guess, MD with the following instrument(s): Forceps and Scissors. Oasis tri-layer was applied and secured  with Steri-Strips. 2.5 sq cm of product was utilized and 2.5 sq cm was wasted due to wound size. Post Application, sorbact, gauze was applied. A Time Out was conducted at 14:15, prior to the start of the procedure. The procedure was tolerated well with a pain level of 0 throughout and a pain level of 0 following the procedure. Post procedure Diagnosis Wound #4: Same as Pre-Procedure General Notes: scribed for Dr. Lady Gary by Zenaida Deed, RN. Wound #1 Pre-procedure diagnosis of Wound #1 is a Pressure Ulcer located on the Right,Medial Malleolus. A skin graft procedure using a bioengineered skin substitute/cellular or tissue based product was performed by Duanne Guess, MD with the following instrument(s): Forceps and Scissors. Oasis tri-layer was applied and secured with Steri-Strips. 2.5 sq cm of product was utilized and 2.5 sq cm was wasted due to wound size. Post Application, sorbact, gauze was applied. A Time Out was conducted at 14:15, prior to the start of the procedure. The procedure was tolerated well with a pain level of 0 throughout and a pain level of 0 following the procedure. Post procedure Diagnosis Wound #1: Same as Pre-Procedure General Notes: scribed for Dr. Lady Gary by Zenaida Deed, RN. Plan Follow-up Appointments: Return Appointment in 1 week. - Dr. Lady Gary - Room 1 Tuesday 8/6 @ 1:15 pm Anesthetic: (In clinic) Topical Lidocaine 4% applied to wound bed Cellular or Tissue Based Products: Wound #1 Right,Medial MalleolusMADELLYN, CONNER A ([id number]) [id number] Page 11 of 14 Cellular or Tissue Based Product Type: - oasis tri-layer #6 Cellular or Tissue Based Product applied to wound bed, secured with steri-strips, cover with Adaptic or Mepitel. (DO NOT REMOVE). Wound #4 Left,Lateral Ankle: Cellular or Tissue Based Product Type: - oasis tri-layer #6 Cellular or Tissue Based Product applied to wound bed, secured with steri-strips, cover with Adaptic  or Mepitel. (DO NOT REMOVE). Bathing/ Shower/ Hygiene: May shower with protection but do not get wound dressing(s) wet. Protect dressing(s) with water repellant cover (for example, large plastic bag) or a cast cover and may then take shower. Off-Loading: Multipodus Splint to: - prevalon boot to both feet Turn and reposition every 2 hours Home Health: No change in wound care orders this week; continue Home Health for wound care. May utilize formulary equivalent dressing for wound treatment orders unless otherwise specified. - may chage outer dressing only on ankles, DO NOT remove sterostrips Dressing changes to be completed by Home Health on Monday / Wednesday / Friday except when patient has scheduled visit at [facility] Other Home Health Orders/Instructions: - [facility] WOUND #1: - Malleolus Wound Laterality: Right, Medial Cleanser:  Normal Saline (Generic) 1 x Per Week/30 Days Discharge Instructions: Cleanse the wound with Normal Saline prior to applying a clean dressing using gauze sponges, not tissue or cotton balls. Cleanser: Byram Ancillary Kit - 15 Day Supply (Generic) 1 x Per Week/30 Days Discharge Instructions: Use supplies as instructed; Kit contains: (15) Saline Bullets; (15) 3x3 Gauze; 15 pr Gloves Peri-Wound Care: Skin Prep (Generic) 1 x Per Week/30 Days Discharge Instructions: Use skin prep as directed Prim Dressing: Cutimed Sorbact Swab 1 x Per Week/30 Days ary Discharge Instructions: Apply to wound bed as instructed Prim Dressing: Oasis tri-layer 1 x Per Week/30 Days ary Secondary Dressing: Woven Gauze Sponge, Non-Sterile 4x4 in 1 x Per Week/30 Days Discharge Instructions: Apply over primary dressing as directed. Secured With: [employer], 4.5x3.1 (in/yd) 1 x Per Week/30 Days Discharge Instructions: Secure with Kerlix as directed. Secured With: 56M Medipore Scientist, research (life sciences) Surgical T 2x10 (in/yd) (Generic) 1 x Per Week/30 Days ape Discharge Instructions: Secure  with tape as directed. WOUND #2: - Gluteus Wound Laterality: Right Cleanser: Normal Saline (Generic) 1 x Per Day/30 Days Discharge Instructions: Cleanse the wound with Normal Saline prior to applying a clean dressing using gauze sponges, not tissue or cotton balls. Peri-Wound Care: Skin Prep (Generic) 1 x Per Day/30 Days Discharge Instructions: Use skin prep as directed Prim Dressing: Dakin's Solution 0.25%, 16 (oz) (Generic) 1 x Per Day/30 Days ary Discharge Instructions: Moisten gauze with Dakin's solution Prim Dressing: Promogran Prisma Matrix, 4.34 (sq in) (silver collagen) (Dispense As Written) 1 x Per Day/30 Days ary Discharge Instructions: Moisten collagen with saline or hydrogel Prim Dressing: cotton tipped applicators (Generic) 1 x Per Day/30 Days ary Discharge Instructions: use to pack wound Secondary Dressing: Zetuvit Plus Silicone Border Dressing 5x5 (in/in) (Generic) 1 x Per Day/30 Days Discharge Instructions: Apply silicone border over primary dressing as directed. Secured With: 56M Medipore H Soft Cloth Surgical T ape, 4 x 10 (in/yd) (Generic) 1 x Per Day/30 Days Discharge Instructions: Secure with tape as directed. WOUND #4: - Ankle Wound Laterality: Left, Lateral Cleanser: Normal Saline (Generic) 1 x Per Week/30 Days Discharge Instructions: Cleanse the wound with Normal Saline prior to applying a clean dressing using gauze sponges, not tissue or cotton balls. Cleanser: Byram Ancillary Kit - 15 Day Supply (Generic) 1 x Per Week/30 Days Discharge Instructions: Use supplies as instructed; Kit contains: (15) Saline Bullets; (15) 3x3 Gauze; 15 pr Gloves Peri-Wound Care: Skin Prep (Generic) 1 x Per Week/30 Days Discharge Instructions: Use skin prep as directed Prim Dressing: Cutimed Sorbact Swab 1 x Per Week/30 Days ary Discharge Instructions: Apply to wound bed as instructed Prim Dressing: Oasis tri-layer 1 x Per Week/30 Days ary Secondary Dressing: Woven Gauze Sponge,  Non-Sterile 4x4 in 1 x Per Week/30 Days Discharge Instructions: Apply over primary dressing as directed. Secured With: [employer], 4.5x3.1 (in/yd) 1 x Per Week/30 Days Discharge Instructions: Secure with Kerlix as directed. Secured With: 56M Medipore Scientist, research (life sciences) Surgical T 2x10 (in/yd) (Generic) 1 x Per Week/30 Days ape Discharge Instructions: Secure with tape as directed. 08/24/2022: The buttock ulcer cavity is smaller again today. She no longer has the tenderness at the deepest aspect of the wound. Both ankle wounds measured about a millimeter smaller. There is healthy-looking granulation tissue present bilaterally with minimal slough on the left and no slough seen on the right. The gluteal ulcer did not require any debridement. We will continue to pack it with Dakin's moistened gauze. Used a curette to debride slough  off of the left lateral ankle ulcer; the right medial ankle ulcer did not require debridement. Oasis Tri layer was rehydrated with saline and applied to both wounds. It was secured in place with Sorbact and Steri-Strips. Follow-up in 1 week. Electronic Signature(s) Signed: 08/24/2022 5:47:38 PM By: Zenaida Deed RN, BSN Signed: 08/25/2022 7:48:01 AM By: Duanne Guess MD FACS Previous Signature: 08/24/2022 2:46:48 PM Version By: Duanne Guess MD FACS Previous Signature: 08/24/2022 2:36:02 PM Version By: Duanne Guess MD FACS Entered By: Zenaida Deed on 08/24/2022 17:46:30 [name] ([id number]) [id number] Page 12 of 14 -------------------------------------------------------------------------------- HxROS Details Patient Name: Date of Service: Andrea Yu 08/24/2022 1:15 PM Medical Record Number: [id number] Patient Account Number: [id number] Date of Birth/Sex: Treating RN: [birth year] (82 y.o. F) Primary Care Provider: Jarome Matin Other Clinician: Referring Provider: Treating Provider/Extender: Marena Chancy in Treatment: 39 Information Obtained From Patient Caregiver Chart Constitutional Symptoms (General Health) Medical History: Past Medical History Notes: morbid obesity Eyes Medical History: Positive for: Cataracts - bil extracted Negative for: Glaucoma; Optic Neuritis Ear/Nose/Mouth/Throat Medical History: Past Medical History Notes: hard of hearing Cardiovascular Medical History: Positive for: Arrhythmia - afib; Hypertension Gastrointestinal Medical History: Past Medical History Notes: GERD Endocrine Medical History: Negative for: Type I Diabetes; Type II Diabetes Past Medical History Notes: adrenal insufficiency Genitourinary Medical History: Negative for: End Stage Renal Disease Integumentary (Skin) Medical History: Negative for: History of Burn Musculoskeletal Medical History: Positive for: Osteoarthritis; Osteomyelitis - pelvic Past Medical History Notes: psoriatic arthritis, thoracic discitis, displaced spiral fx right tibia Neurologic Medical History: Positive for: Neuropathy Past Medical History Notes: hx SAH ESSIEMAE, MATER A ([id number]) [id number] Page 13 of 14 Oncologic Medical History: Negative for: Received Chemotherapy; Received Radiation Psychiatric Medical History: Positive for: Confinement Anxiety Negative for: Anorexia/bulimia HBO Extended History Items Eyes: Cataracts Immunizations Pneumococcal Vaccine: Received Pneumococcal Vaccination: Yes Received Pneumococcal Vaccination On or After 60th Birthday: Yes Implantable Devices None Hospitalization / Surgery History Type of Hospitalization/Surgery TEE right ankle fusion bil knee replacements bil cataract extractions posterior lumbar fusion vaginal hysterectomy cholecystectomy Family and Social History Cancer: Yes - Father,Maternal Grandparents,Siblings,Child; Diabetes: No; Heart Disease: Yes - Father; Hereditary  Spherocytosis: No; Hypertension: Yes - Father; Kidney Disease: No; Lung Disease: No; Seizures: No; Stroke: No; Thyroid Problems: Yes - Mother; Tuberculosis: No; Former smoker - quit 40 yr ago; Marital Status - Widowed; Alcohol Use: Never; Drug Use: No History; Caffeine Use: Daily - tea, soda; Financial Concerns: No; Food, Clothing or Shelter Needs: No; Support System Lacking: No; Transportation Concerns: No Psychologist, prison and probation services) Signed: 08/24/2022 3:21:59 PM By: Duanne Guess MD FACS Entered By: Duanne Guess on 08/24/2022 14:32:59 -------------------------------------------------------------------------------- SuperBill Details Patient Name: Date of Service: Andrea Yu 08/24/2022 Medical Record Number: [id number] Patient Account Number: [id number] Date of Birth/Sex: Treating RN: Dec 09, 1940 (81 y.o. 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 L02.31 Cutaneous abscess of buttock E27.40 Unspecified adrenocortical insufficiency Z79.52 Long term (current) use of systemic steroids I10 Essential (primary) hypertension MAKAYLEN, BLAKELY A ([id number]) [id number] Page 14 of 14 Facility Procedures : CPT4: Code [id number] Description: Q4124 Oasis Ultra per sq CM Modifier: Quantity: 10 : CPT4: 56387564 Description: P3295 Application of skin substitute graft to trunk, arms, legs,  total wound surface area up to 100 sq cm; first 25 sqcm or less wound surface area ICD-10 Diagnosis Description 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, stage 3 L89.523 Pressure  ulcer of left ankle, stage 3 Modifier: Quantity:  1 Physician Procedures : CPT4: Description Modifier Code [id number] - WC PHYS LEVEL 4 - EST PT 25 ICD-10 Diagnosis Description 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, stage 3 L89.523 Pressure ulcer of left ankle, stage 3 L98.415 Non-pressure chronic ulcer of buttock with  muscle involvement without evidence of necrosis Z79.52 Long term (current) use of systemic steroids Quantity: 1 : CPT4: C5271 Application of skin substitute graft to trunk, arms, legs, total wound surface area up to 100 sq cm; first 25 sqcm or less wound surface area ICD-10 Diagnosis Description 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

## 2022-08-25 NOTE — Progress Notes (Signed)
JANNELY, KIRIN A ([id number]) [id number] Page 1 of 11 Visit Report for 08/24/2022 Arrival Information Details Patient Name: Date of Service: Andrea Yu 08/24/2022 1:15 PM Medical Record Number: [id number] Patient Account Number: [id number] Date of Birth/Sex: Treating RN: Mar 17, 1940 (82 y.o. [name] Primary Care Zacherie Honeyman: Jarome Matin Other Clinician: Referring Kaiya Boatman: Treating Shariff Lasky/Extender: Marena Chancy in Treatment: 39 Visit Information History Since Last Visit Added or deleted any medications: No Patient Arrived: Wheel Chair Any new allergies or adverse reactions: No Arrival Time: 13:40 Had a fall or experienced change in No Accompanied By: caregiver activities of daily living that may affect Transfer Assistance: Manual risk of falls: Patient Identification Verified: Yes Signs or symptoms of abuse/neglect since last visito No Secondary Verification Process Completed: Yes Hospitalized since last visit: No Patient Requires Transmission-Based Precautions: No Implantable device outside of the clinic excluding No Patient Has Alerts: No cellular tissue based products placed in the center since last visit: Has Dressing in Place as Prescribed: Yes Pain Present Now: Yes Electronic Signature(s) Signed: 08/24/2022 5:32:33 PM By: Zenaida Deed RN, BSN Entered By: Zenaida Deed on 08/24/2022 13:45:14 -------------------------------------------------------------------------------- Encounter Discharge Information Details Patient Name: Date of Service: Andrea Yu 08/24/2022 1:15 PM Medical Record Number: [id number] Patient Account Number: [id number] Date of Birth/Sex: Treating RN: [birth year] (82 y.o. [name] Primary Care Maryama Kuriakose: Jarome Matin Other Clinician: Referring Brayla Pat: Treating Azad Calame/Extender: Marena Chancy in Treatment: 39 Encounter Discharge  Information Items Post Procedure Vitals Discharge Condition: Stable Temperature (F): 97.9 Ambulatory Status: Wheelchair Pulse (bpm): 72 Discharge Destination: Home Respiratory Rate (breaths/min): 18 Transportation: Private Auto Blood Pressure (mmHg): 141/72 Accompanied By: caregiver Schedule Follow-up Appointment: Yes Clinical Summary of Care: Patient Declined Electronic Signature(s) Signed: 08/24/2022 5:32:33 PM By: Zenaida Deed RN, BSN Entered By: Zenaida Deed on 08/24/2022 16:50:56 [name] ([id number]) [id number] Page 2 of 11 -------------------------------------------------------------------------------- Lower Extremity Assessment Details Patient Name: Date of Service: Andrea Yu 08/24/2022 1:15 PM Medical Record Number: [id number] Patient Account Number: [id number] Date of Birth/Sex: Treating RN: Feb 04, 1940 (82 y.o. [name] Primary Care Herve Haug: Jarome Matin Other Clinician: Referring Tyner Codner: Treating Makailah Slavick/Extender: Marena Chancy in Treatment: 39 Edema Assessment Assessed: [Left: No] [Right: No] Edema: [Left: No] [Right: No] Calf Left: Right: Point of Measurement: From Medial Instep 29.5 cm 33.3 cm Ankle Left: Right: Point of Measurement: From Medial Instep 18.5 cm 23 cm Vascular Assessment Pulses: Dorsalis Pedis Palpable: [Left:Yes] [Right:Yes] Extremity colors, hair growth, and conditions: Extremity Color: [Left:Hyperpigmented] [Right:Hyperpigmented] Hair Growth on Extremity: [Left:No] [Right:No] Temperature of Extremity: [Left:Warm < 3 seconds] [Right:Warm < 3 seconds] Electronic Signature(s) Signed: 08/24/2022 5:32:33 PM By: Zenaida Deed RN, BSN Entered By: Zenaida Deed on 08/24/2022 13:53:05 -------------------------------------------------------------------------------- Multi Wound Chart Details Patient Name: Date of Service: Andrea Yu 08/24/2022 1:15  PM Medical Record Number: [id number] Patient Account Number: [id number] Date of Birth/Sex: Treating RN: 02-22-1940 (81 y.o. F) Primary Care Alayza Pieper: Jarome Matin Other Clinician: Referring Taura Lamarre: Treating Jilian West/Extender: Marena Chancy in Treatment: 39 Vital Signs Height(in): 59 Pulse(bpm): 72 Weight(lbs): 155 Blood Pressure(mmHg): 141/72 Body Mass Index(BMI): 31.3 Temperature(F): 97.9 Respiratory Rate(breaths/min): 18 [1:Photos: No Photos Right, Medial Malleolus Wound Location: Pressure Injury Wounding Event: Pressure Ulcer Primary Etiology:] [2:No Photos Right Gluteus Bump Abscess] [4:No Photos Left, Lateral Ankle Pressure Injury Pressure Ulcer] ARTINA, SOSNA A ([id number]) [1:Cataracts, Arrhythmia, Hypertension,Cataracts, Arrhythmia, Hypertension, Cataracts, Arrhythmia, Hypertension, Comorbid History: Osteoarthritis, Osteomyelitis, Neuropathy, Confinement Anxiety 11/16/2021  Date Acquired: 101 Weeks of Treatment: Open Wound  Status: No Wound Recurrence: 1.1x0.8x0.1 Measurements L x W x D (cm) 0.691 A (cm) : rea 0.069 Volume (cm) : 55.10% % Reduction in A rea: 55.20% % Reduction in Volume: Starting Position 1 (o'clock): Ending Position 1 (o'clock): Maximum Distance 1 (cm): No  Undermining: Category/Stage III Classification: Medium Exudate A mount: Serosanguineous Exudate Type: red, brown Exudate Color: Distinct, outline attached Wound Margin: Medium (34-66%) Granulation A mount: Pink, Pale Granulation Quality: Medium (34-66%)  Necrotic A mount: Fat Layer (Subcutaneous Tissue): Yes Fat Layer (Subcutaneous Tissue): Yes Fat Layer (Subcutaneous Tissue): Yes Exposed Structures: Fascia: No Tendon: No Muscle: No Joint: No Bone: No Small (1-33%) Epithelialization: N/A Debridement:  Pre-procedure Verification/Time Out N/A Taken: N/A Pain Control: N/A Tissue Debrided: N/A Level: N/A Debridement A (sq cm): rea N/A Instrument: N/A Bleeding: N/A Hemostasis A chieved: N/A  Procedural Pain: N/A Post Procedural Pain: N/A Debridement  Treatment Response: N/A Post Debridement Measurements L x W x D (cm) N/A Post Debridement Volume: (cm) N/A Post Debridement Stage: No Abnormalities Noted Periwound Skin Texture: No Abnormalities Noted Periwound Skin Moisture: Erythema: Yes Periwound Skin  Color: Measured: 2.5cm Erythema Measurement: No Change Erythema Change: No Abnormality Temperature: N/A Tenderness on Palpation: Cellular or Tissue Based Product Procedures Performed:] [2:Osteoarthritis, Osteomyelitis, Neuropathy, Confinement Anxiety  09/28/2021 39 Open No 0.8x1.7x3.4 1.068 3.632 89.80% 88.40% 12 3 2.7 Yes Full Thickness With Exposed Support Category/Stage IV Structures Medium Serous amber Well defined, not attached Large (67-100%) Red Small (1-33%) Fascia: No Tendon: No Muscle: No  Joint: No Bone: No Small (1-33%) N/A N/A N/A N/A N/A N/A N/A N/A N/A N/A N/A N/A N/A N/A N/A No Abnormalities Noted No Abnormalities Noted Erythema: No N/A N/A No Abnormality Yes N/A] [id number].pdf Page 3 of 11 Osteoarthritis,  Osteomyelitis, Neuropathy, Confinement Anxiety 12/23/2021 3 Open No 0.8x0.7x0.2 0.44 0.088 -212.10% -528.60% No Medium Serosanguineous red, brown Distinct, outline attached Large (67-100%) Red, Pale Small (1-33%) Tendon: Yes Fascia: No Muscle: No Joint:  No Bone: No Small (1-33%) Debridement - Selective/Open Wound 14:15 Lidocaine 4% Topical Solution Slough Non-Viable Tissue 0.44 Curette Minimum Pressure 0 0 Procedure was tolerated well 0.8x0.7x0.2 0.088 Category/Stage IV No Abnormalities Noted No  Abnormalities Noted Erythema: No Rubor: No N/A N/A No Abnormality Yes Cellular or Tissue Based Product Debridement] Treatment Notes Electronic Signature(s) Signed: 08/24/2022 2:31:46 PM By: Duanne Guess MD FACS Entered By: Duanne Guess on 08/24/2022  14:31:46 -------------------------------------------------------------------------------- Multi-Disciplinary Care Plan Details Patient Name: Date of Service: Andrea Yu 08/24/2022 1:15 PM Medical Record Number: [id number] Patient Account Number: [id number] Date of Birth/Sex: Treating RN: [birth year] (81 y.o. [name] Primary Care Lylia Karn: Jarome Matin Other Clinician: Referring Chante Mayson: Treating Lindsay Straka/Extender: Eriona, Agnes ([id number]) [id number] Page 4 of 11 Weeks in Treatment: 39 Multidisciplinary Care Plan reviewed with physician Active Inactive Pressure Nursing Diagnoses: Knowledge deficit related to causes and risk factors for pressure ulcer development Knowledge deficit related to management of pressures ulcers Potential for impaired tissue integrity related to pressure, friction, moisture, and shear Goals: Patient/caregiver will verbalize understanding of pressure ulcer management Date Initiated: 11/24/2021 Target Resolution Date: 09/24/2022 Goal Status: Active Interventions: Assess: immobility, friction, shearing, incontinence upon admission and as needed Assess offloading mechanisms upon admission and as needed Assess potential for pressure ulcer upon admission and as needed Provide education on pressure ulcers Treatment Activities: Pressure reduction/relief device ordered : 11/24/2021 Notes: Wound/Skin Impairment Nursing Diagnoses: Impaired tissue integrity Knowledge deficit related to  ulceration/compromised skin integrity Goals: Patient/caregiver will verbalize understanding of skin care regimen Date Initiated: 11/24/2021 Target Resolution Date: 09/24/2022 Goal Status: Active Ulcer/skin breakdown will have a volume reduction of 30% by week 4 Date Initiated: 11/24/2021 Date Inactivated: 05/31/2022 Target Resolution Date: 05/25/2022 Unmet Reason: refuses VAC, Goal Status:  Unmet noncompliant with offloading Interventions: Assess patient/caregiver ability to obtain necessary supplies Assess patient/caregiver ability to perform ulcer/skin care regimen upon admission and as needed Assess ulceration(s) every visit Provide education on ulcer and skin care Treatment Activities: Skin care regimen initiated : 11/24/2021 Topical wound management initiated : 11/24/2021 Notes: Electronic Signature(s) Signed: 08/24/2022 5:32:33 PM By: Zenaida Deed RN, BSN Entered By: Zenaida Deed on 08/24/2022 13:41:38 -------------------------------------------------------------------------------- Pain Assessment Details Patient Name: Date of Service: Andrea Yu 08/24/2022 1:15 PM Medical Record Number: [id number] Patient Account Number: [id number] Date of Birth/Sex: Treating RN: 1941-01-12 (82 y.o. [name] Primary Care Ruey Storer: Jarome Matin Other Clinician: [name] ([id number]) [id number] Page 5 of 11 Referring Breanne Olvera: Treating Taunya Goral/Extender: Marena Chancy in Treatment: 39 Active Problems Location of Pain Severity and Description of Pain Patient Has Paino Yes Site Locations Pain Location: Pain in Ulcers With Dressing Change: Yes Duration of the Pain. Constant / Intermittento Intermittent Rate the pain. Current Pain Level: 0 Worst Pain Level: 7 Least Pain Level: 0 Character of Pain Describe the Pain: Burning Pain Management and Medication Current Pain Management: Medication: Yes Is the Current Pain Management Adequate: Adequate How does your wound impact your activities of daily livingo Sleep: No Bathing: No Appetite: No Relationship With Others: No Bladder Continence: No Emotions: Yes Bowel Continence: No Hobbies: No Toileting: No Dressing: No Electronic Signature(s) Signed: 08/24/2022 5:32:33 PM By: Zenaida Deed RN, BSN Entered By: Zenaida Deed on 08/24/2022  13:46:18 -------------------------------------------------------------------------------- Patient/Caregiver Education Details Patient Name: Date of Service: Andrea Yu 7/30/2024andnbsp1:15 PM Medical Record Number: [id number] Patient Account Number: [id number] Date of Birth/Gender: Treating RN: Sep 18, 1940 (81 y.o. [name] Primary Care Physician: Jarome Matin Other Clinician: Referring Physician: Treating Physician/Extender: Marena Chancy in Treatment: 39 Education Assessment Education Provided To: Patient Education Topics Provided Pressure: Methods: Explain/Verbal Responses: Reinforcements needed, State content correctly ZAMYIAH, KIENER A ([id number]) [id number] Page 6 of 11 Wound/Skin Impairment: Methods: Explain/Verbal Responses: Reinforcements needed, State content correctly Electronic Signature(s) Signed: 08/24/2022 5:32:33 PM By: Zenaida Deed RN, BSN Entered By: Zenaida Deed on 08/24/2022 13:44:41 -------------------------------------------------------------------------------- Wound Assessment Details Patient Name: Date of Service: Andrea Yu 08/24/2022 1:15 PM Medical Record Number: [id number] Patient Account Number: [id number] Date of Birth/Sex: Treating RN: [birth year] (82 y.o. Billy Coast, Linda Primary Care Armen Waring: Jarome Matin Other Clinician: Referring [place] Festa: Treating Mialynn Shelvin/Extender: Marena Chancy in Treatment: 39 Wound Status Wound Number: 1 Primary Pressure Ulcer Etiology: Wound Location: Right, Medial Malleolus Wound Open Wounding Event: Pressure Injury Status: Date Acquired: 11/16/2021 Comorbid Cataracts, Arrhythmia, Hypertension, Osteoarthritis, Weeks Of Treatment: 39 History: Osteomyelitis, Neuropathy, Confinement Anxiety Clustered Wound: No Photos Wound Measurements Length: (cm) 1.1 Width: (cm) 0.8 Depth: (cm) 0.1 Area: (cm)  0.691 Volume: (cm) 0.069 % Reduction in Area: 55.1% % Reduction in Volume: 55.2% Epithelialization: Small (1-33%) Tunneling: No Undermining: No Wound Description Classification: Category/Stage III Wound Margin: Distinct, outline attached Exudate Amount: Medium Exudate Type: Serosanguineous Exudate Color: red, brown Foul Odor After Cleansing: No Slough/Fibrino Yes Wound Bed Granulation Amount: Medium (34-66%) Exposed Structure Granulation Quality: Pink, Pale Fascia Exposed: No Necrotic Amount: Medium (34-66%) Fat Layer (Subcutaneous Tissue) Exposed:  Yes Necrotic Quality: Adherent Slough Tendon Exposed: No Muscle Exposed: No Joint Exposed: No Bone Exposed: No Periwound Skin Texture Texture Color YVONE, PANO A ([id number]) [id number] Page 7 of 11 No Abnormalities Noted: Yes No Abnormalities Noted: No Erythema: Yes Moisture Erythema Measurement: Measured No Abnormalities Noted: Yes 2.5 cm Erythema Change: No Change Temperature / Pain Temperature: No Abnormality Treatment Notes Wound #1 (Malleolus) Wound Laterality: Right, Medial Cleanser Normal Saline Discharge Instruction: Cleanse the wound with Normal Saline prior to applying a clean dressing using gauze sponges, not tissue or cotton balls. Byram Ancillary Kit - 15 Day Supply Discharge Instruction: Use supplies as instructed; Kit contains: (15) Saline Bullets; (15) 3x3 Gauze; 15 pr Gloves Peri-Wound Care Skin Prep Discharge Instruction: Use skin prep as directed Topical Primary Dressing Cutimed Sorbact Swab Discharge Instruction: Apply to wound bed as instructed Oasis tri-layer Secondary Dressing Woven Gauze Sponge, Non-Sterile 4x4 in Discharge Instruction: Apply over primary dressing as directed. Secured With [employer], 4.5x3.1 (in/yd) Discharge Instruction: Secure with Kerlix as directed. 10M Medipore Soft Cloth Surgical T 2x10 (in/yd) ape Discharge Instruction: Secure  with tape as directed. Compression Wrap Compression Stockings Add-Ons Electronic Signature(s) Signed: 08/24/2022 5:47:38 PM By: Zenaida Deed RN, BSN Previous Signature: 08/24/2022 5:32:33 PM Version By: Zenaida Deed RN, BSN Entered By: Zenaida Deed on 08/24/2022 17:45:18 -------------------------------------------------------------------------------- Wound Assessment Details Patient Name: Date of Service: Andrea Yu 08/24/2022 1:15 PM Medical Record Number: [id number] Patient Account Number: [id number] Date of Birth/Sex: Treating RN: [birth year] (81 y.o. [name] Primary Care Ermina Oberman: Jarome Matin Other Clinician: Referring Solyana Nonaka: Treating Enrique Manganaro/Extender: Marena Chancy in Treatment: 39 Wound Status Wound Number: 2 Primary Abscess Etiology: Wound Location: Right Gluteus Wound Open Wounding Event: Bump Status: Date Acquired: 09/28/2021 Comorbid Cataracts, Arrhythmia, Hypertension, Osteoarthritis, Weeks Of Treatment: 39 History: Osteomyelitis, Neuropathy, Confinement Anxiety Clustered Wound: No JAHNAI, OUELLETTE A ([id number]) [id number] Page 8 of 11 Photos Wound Measurements Length: (cm) 0.8 Width: (cm) 1.7 Depth: (cm) 3.4 Area: (cm) 1.068 Volume: (cm) 3.632 % Reduction in Area: 89.8% % Reduction in Volume: 88.4% Epithelialization: Small (1-33%) Tunneling: No Undermining: Yes Starting Position (o'clock): 12 Ending Position (o'clock): 3 Maximum Distance: (cm) 2.7 Wound Description Classification: Full Thickness With Exposed Support Structures Wound Margin: Well defined, not attached Exudate Amount: Medium Exudate Type: Serous Exudate Color: amber Foul Odor After Cleansing: No Slough/Fibrino Yes Wound Bed Granulation Amount: Large (67-100%) Exposed Structure Granulation Quality: Red Fascia Exposed: No Necrotic Amount: Small (1-33%) Fat Layer (Subcutaneous Tissue) Exposed:  Yes Necrotic Quality: Adherent Slough Tendon Exposed: No Muscle Exposed: No Joint Exposed: No Bone Exposed: No Periwound Skin Texture Texture Color No Abnormalities Noted: Yes No Abnormalities Noted: Yes Moisture Temperature / Pain No Abnormalities Noted: Yes Temperature: No Abnormality Tenderness on Palpation: Yes Treatment Notes Wound #2 (Gluteus) Wound Laterality: Right Cleanser Normal Saline Discharge Instruction: Cleanse the wound with Normal Saline prior to applying a clean dressing using gauze sponges, not tissue or cotton balls. Peri-Wound Care Skin Prep Discharge Instruction: Use skin prep as directed Topical Primary Dressing Dakin's Solution 0.25%, 16 (oz) Discharge Instruction: Moisten gauze with Dakin's solution Promogran Prisma Matrix, 4.34 (sq in) (silver collagen) Discharge Instruction: Moisten collagen with saline or hydrogel cotton tipped applicators Discharge Instruction: use to pack wound Secondary Dressing Zetuvit Plus Silicone Border Dressing 5x5 (in/in) Discharge Instruction: Apply silicone border over primary dressing as directed. TAKELIA, MCBRAYER A ([id number]) [id number] Page 9 of 11 Secured With 10M Medipore [employer] Surgical T ape, 4 x  10 (in/yd) Discharge Instruction: Secure with tape as directed. Compression Wrap Compression Stockings Add-Ons Electronic Signature(s) Signed: 08/24/2022 5:47:38 PM By: Zenaida Deed RN, BSN Previous Signature: 08/24/2022 5:32:33 PM Version By: Zenaida Deed RN, BSN Entered By: Zenaida Deed on 08/24/2022 17:45:39 -------------------------------------------------------------------------------- Wound Assessment Details Patient Name: Date of Service: Andrea Yu 08/24/2022 1:15 PM Medical Record Number: [id number] Patient Account Number: [id number] Date of Birth/Sex: Treating RN: 1940/09/09 (81 y.o. Billy Coast, Linda Primary Care Mekenna Finau: Jarome Matin Other  Clinician: Referring Raelie Lohr: Treating Any Mcneice/Extender: Marena Chancy in Treatment: 39 Wound Status Wound Number: 4 Primary Pressure Ulcer Etiology: Wound Location: Left, Lateral Ankle Wound Open Wounding Event: Pressure Injury Status: Date Acquired: 12/23/2021 Comorbid Cataracts, Arrhythmia, Hypertension, Osteoarthritis, Weeks Of Treatment: 33 History: Osteomyelitis, Neuropathy, Confinement Anxiety Clustered Wound: No Photos Wound Measurements Length: (cm) 0.8 Width: (cm) 0.7 Depth: (cm) 0.2 Area: (cm) 0.44 Volume: (cm) 0.088 % Reduction in Area: -212.1% % Reduction in Volume: -528.6% Epithelialization: Small (1-33%) Tunneling: No Undermining: No Wound Description Classification: Category/Stage IV Wound Margin: Distinct, outline attached Exudate Amount: Medium Exudate Type: Serosanguineous Exudate Color: red, brown Foul Odor After Cleansing: No Slough/Fibrino Yes Wound Bed Granulation Amount: Large (67-100%) Exposed Structure Granulation Quality: Red, Pale Fascia Exposed: No Necrotic Amount: Small (1-33%) Fat Layer (Subcutaneous Tissue) Exposed: Yes Necrotic Quality: Adherent Slough Tendon ExposedEMMALEAH, MONCION A ([id number]) [id number] Page 10 of 11 Muscle Exposed: No Joint Exposed: No Bone Exposed: No Periwound Skin Texture Texture Color No Abnormalities Noted: Yes No Abnormalities Noted: Yes Moisture Temperature / Pain No Abnormalities Noted: Yes Temperature: No Abnormality Tenderness on Palpation: Yes Treatment Notes Wound #4 (Ankle) Wound Laterality: Left, Lateral Cleanser Normal Saline Discharge Instruction: Cleanse the wound with Normal Saline prior to applying a clean dressing using gauze sponges, not tissue or cotton balls. Byram Ancillary Kit - 15 Day Supply Discharge Instruction: Use supplies as instructed; Kit contains: (15) Saline Bullets; (15) 3x3 Gauze; 15 pr Gloves Peri-Wound  Care Skin Prep Discharge Instruction: Use skin prep as directed Topical Primary Dressing Cutimed Sorbact Swab Discharge Instruction: Apply to wound bed as instructed Oasis tri-layer Secondary Dressing Woven Gauze Sponge, Non-Sterile 4x4 in Discharge Instruction: Apply over primary dressing as directed. Secured With [employer], 4.5x3.1 (in/yd) Discharge Instruction: Secure with Kerlix as directed. 42M Medipore Soft Cloth Surgical T 2x10 (in/yd) ape Discharge Instruction: Secure with tape as directed. Compression Wrap Compression Stockings Add-Ons Electronic Signature(s) Signed: 08/24/2022 5:47:38 PM By: Zenaida Deed RN, BSN Previous Signature: 08/24/2022 5:32:33 PM Version By: Zenaida Deed RN, BSN Entered By: Zenaida Deed on 08/24/2022 17:46:03 -------------------------------------------------------------------------------- Vitals Details Patient Name: Date of Service: Andrea Yu 08/24/2022 1:15 PM Medical Record Number: [id number] Patient Account Number: [id number] Date of Birth/Sex: Treating RN: Dec 22, 1940 (81 y.o. [name] Primary Care Kipton Skillen: Jarome Matin Other Clinician: Referring Alantra Popoca: Treating Casidee Jann/Extender: Marena Chancy in Treatment: 39 Vital Signs Time Taken: 13:45 Temperature (F): 97.9 Height (in): 59 Pulse (bpm): 72 Weight (lbs): 155 Respiratory Rate (breaths/min): 18 Klugh, Mireille A ([id number]) [id number].pdf Page 11 of 11 Body Mass Index (BMI): 31.3 Blood Pressure (mmHg): 141/72 Reference Range: 80 - 120 mg / dl Electronic Signature(s) Signed: 08/24/2022 5:32:33 PM By: Zenaida Deed RN, BSN Entered By: Zenaida Deed on 08/24/2022 13:45:36

## 2022-08-31 ENCOUNTER — Encounter (HOSPITAL_BASED_OUTPATIENT_CLINIC_OR_DEPARTMENT_OTHER): Payer: Medicare Other | Attending: General Surgery | Admitting: General Surgery

## 2022-08-31 DIAGNOSIS — Z96653 Presence of artificial knee joint, bilateral: Secondary | ICD-10-CM | POA: Insufficient documentation

## 2022-08-31 DIAGNOSIS — Z981 Arthrodesis status: Secondary | ICD-10-CM | POA: Diagnosis not present

## 2022-08-31 DIAGNOSIS — I10 Essential (primary) hypertension: Secondary | ICD-10-CM | POA: Diagnosis not present

## 2022-08-31 DIAGNOSIS — E2740 Unspecified adrenocortical insufficiency: Secondary | ICD-10-CM | POA: Diagnosis not present

## 2022-08-31 DIAGNOSIS — L98415 Non-pressure chronic ulcer of buttock with muscle involvement without evidence of necrosis: Secondary | ICD-10-CM | POA: Insufficient documentation

## 2022-08-31 DIAGNOSIS — Z7952 Long term (current) use of systemic steroids: Secondary | ICD-10-CM | POA: Insufficient documentation

## 2022-08-31 DIAGNOSIS — L89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, stage 3: Secondary | ICD-10-CM | POA: Diagnosis present

## 2022-08-31 DIAGNOSIS — Z87891 Personal history of nicotine dependence: Secondary | ICD-10-CM | POA: Insufficient documentation

## 2022-08-31 DIAGNOSIS — L89523 Pressure ulcer of left ankle, stage 3: Secondary | ICD-10-CM | POA: Diagnosis not present

## 2022-08-31 DIAGNOSIS — L0231 Cutaneous abscess of buttock: Secondary | ICD-10-CM | POA: Insufficient documentation

## 2022-08-31 NOTE — Progress Notes (Signed)
KALESHIA, MUELLER A ([id number]) [id number] Page 1 of 12 Visit Report for 08/31/2022 Arrival Information Details Patient Name: Date of Service: Andrea Yu 08/31/2022 1:15 PM Medical Record Number: [id number] Patient Account Number: [id number] Date of Birth/Sex: Treating RN: [birth year] (82 y.o. Andrea Yu Lyah Millirons: Jarome Matin Other Clinician: Referring Mirinda Monte: Treating Bellina Tokarczyk/Extender: Marena Chancy in Treatment: 40 Visit Information History Since Last Visit Added or deleted any medications: No Patient Arrived: Wheel Chair Any new allergies or adverse reactions: No Arrival Time: 13:22 Had a fall or experienced change in No Accompanied By: caregiver activities of daily living that may affect Transfer Assistance: None risk of falls: Patient Identification Verified: Yes Signs or symptoms of abuse/neglect since last visito No Secondary Verification Process Completed: Yes Hospitalized since last visit: No Patient Requires Transmission-Based Precautions: No Implantable device outside of the clinic excluding No Patient Has Alerts: No cellular tissue based products placed in the center since last visit: Has Dressing in Place as Prescribed: Yes Pain Present Now: No Electronic Signature(s) Signed: 08/31/2022 3:51:43 PM By: Zenaida Deed RN, BSN Entered By: Zenaida Deed on 08/31/2022 13:22:48 -------------------------------------------------------------------------------- Encounter Discharge Information Details Patient Name: Date of Service: Andrea Yu 08/31/2022 1:15 PM Medical Record Number: [id number] Patient Account Number: [id number] Date of Birth/Sex: Treating RN: 1940/09/16 (81 y.o. Andrea Yu Andrea Yu: Jarome Matin Other Clinician: Referring Andrea Yu: Treating Andrea Yu/Extender: Marena Chancy in Treatment: 40 Encounter Discharge  Information Items Post Procedure Vitals Discharge Condition: Stable Temperature (F): 98.6 Ambulatory Status: Wheelchair Pulse (bpm): 76 Discharge Destination: Home Respiratory Rate (breaths/min): 18 Transportation: Private Auto Blood Pressure (mmHg): 136/70 Accompanied By: caregiver Schedule Follow-up Appointment: Yes Clinical Summary of Yu: Patient Declined Electronic Signature(s) Signed: 08/31/2022 3:51:43 PM By: Zenaida Deed RN, BSN Entered By: Zenaida Deed on 08/31/2022 14:22:18 [name] ([id number]) [id number] Page 2 of 12 -------------------------------------------------------------------------------- Lower Extremity Assessment Details Patient Name: Date of Service: Andrea Yu 08/31/2022 1:15 PM Medical Record Number: [id number] Patient Account Number: [id number] Date of Birth/Sex: Treating RN: Jun 14, 1940 (82 y.o. Andrea Yu Johnnye Sandford: Jarome Matin Other Clinician: Referring Andrea Yu: Treating Andrea Yu/Extender: Marena Chancy in Treatment: 40 Edema Assessment Assessed: [Left: No] [Right: No] Edema: [Left: No] [Right: No] Calf Left: Right: Point of Measurement: From Medial Instep 29.5 cm 33.3 cm Ankle Left: Right: Point of Measurement: From Medial Instep 18.5 cm 23 cm Vascular Assessment Pulses: Dorsalis Pedis Palpable: [Left:Yes] [Right:Yes] Extremity colors, hair growth, and conditions: Extremity Color: [Left:Hyperpigmented] [Right:Hyperpigmented] Hair Growth on Extremity: [Left:No] [Right:No] Temperature of Extremity: [Left:Warm < 3 seconds] [Right:Warm < 3 seconds] Electronic Signature(s) Signed: 08/31/2022 3:51:43 PM By: Zenaida Deed RN, BSN Entered By: Zenaida Deed on 08/31/2022 13:29:45 -------------------------------------------------------------------------------- Multi Wound Chart Details Patient Name: Date of Service: Andrea Yu 08/31/2022 1:15  PM Medical Record Number: [id number] Patient Account Number: [id number] Date of Birth/Sex: Treating RN: September 06, 1940 (81 y.o. Andrea Yu Andrea Yu: Jarome Matin Other Clinician: Referring Chaka Jefferys: Treating Shonte Soderlund/Extender: Marena Chancy in Treatment: 40 Vital Signs Height(in): 59 Pulse(bpm): 76 Weight(lbs): 155 Blood Pressure(mmHg): 136/70 Body Mass Index(BMI): 31.3 Temperature(F): 98.6 Respiratory Rate(breaths/min): 18 [1:Photos:] [[id number] Page 3 of 12] Right, Medial Malleolus Right Gluteus Left, Lateral Ankle Wound Location: Pressure Injury Bump Pressure Injury Wounding Event: Pressure Ulcer Abscess Pressure Ulcer Primary Etiology: Cataracts, Arrhythmia, Hypertension, Cataracts, Arrhythmia, Hypertension, Cataracts, Arrhythmia, Hypertension, Comorbid History: Osteoarthritis, Osteomyelitis, Osteoarthritis, Osteomyelitis, Osteoarthritis, Osteomyelitis, Neuropathy, Confinement  Anxiety Neuropathy, Confinement Anxiety Neuropathy, Confinement Anxiety 11/16/2021 09/28/2021 12/23/2021 Date Acquired: 40 40 34 Weeks of Treatment: Open Open Open Wound Status: No No No Wound Recurrence: 1x0.7x0.1 0.8x1.7x2.7 0.7x0.6x0.4 Measurements L x W x D (cm) 0.55 1.068 0.33 A (cm) : rea 0.055 2.884 0.132 Volume (cm) : 64.30% 89.80% -134.00% % Reduction in A rea: 64.30% 90.80% -842.90% % Reduction in Volume: Category/Stage III Full Thickness With Exposed Support Category/Stage IV Classification: Structures Medium Medium Medium Exudate A mount: Serosanguineous Serous Serosanguineous Exudate Type: red, brown amber red, brown Exudate Color: Distinct, outline attached Epibole Distinct, outline attached Wound Margin: Large (67-100%) Large (67-100%) Medium (34-66%) Granulation A mount: Pink, Pale Red Pink, Pale Granulation Quality: Small (1-33%) Small (1-33%) Medium (34-66%) Necrotic A mount: Fat Layer  (Subcutaneous Tissue): Yes Fat Layer (Subcutaneous Tissue): Yes Fat Layer (Subcutaneous Tissue): Yes Exposed Structures: Fascia: No Fascia: No Tendon: Yes Tendon: No Tendon: No Fascia: No Muscle: No Muscle: No Muscle: No Joint: No Joint: No Joint: No Bone: No Bone: No Bone: No Small (1-33%) Small (1-33%) None Epithelialization: Debridement - Selective/Open Wound N/A Debridement - Selective/Open Wound Debridement: Pre-procedure Verification/Time Out 13:45 N/A 13:45 Taken: Lidocaine 4% Topical Solution N/A Lidocaine 4% Topical Solution Pain Control: Necrotic/Eschar, Slough N/A Necrotic/Eschar, Slough Tissue Debrided: Non-Viable Tissue N/A Non-Viable Tissue Level: 0.55 N/A 0.33 Debridement A (sq cm): rea Curette N/A Curette Instrument: None N/A None Bleeding: 0 N/A 0 Procedural Pain: 0 N/A 0 Post Procedural Pain: Procedure was tolerated well N/A Procedure was tolerated well Debridement Treatment Response: 1x0.7x0.1 N/A 0.7x0.6x0.4 Post Debridement Measurements L x W x D (cm) 0.055 N/A 0.132 Post Debridement Volume: (cm) Category/Stage III N/A Category/Stage IV Post Debridement Stage: No Abnormalities Noted No Abnormalities Noted No Abnormalities Noted Periwound Skin Texture: No Abnormalities Noted No Abnormalities Noted No Abnormalities Noted Periwound Skin Moisture: Erythema: No Erythema: No Erythema: No Periwound Skin Color: Rubor: No No Abnormality No Abnormality No Abnormality Temperature: N/A Yes Yes Tenderness on Palpation: Cellular or Tissue Based Product N/A Cellular or Tissue Based Product Procedures Performed: Debridement Debridement Treatment Notes Wound #1 (Malleolus) Wound Laterality: Right, Medial Cleanser Normal Saline Discharge Instruction: Cleanse the wound with Normal Saline prior to applying a clean dressing using gauze sponges, not tissue or cotton balls. Byram Ancillary Kit - 15 Day Supply Discharge Instruction: Use supplies as  instructed; Kit contains: (15) Saline Bullets; (15) 3x3 Gauze; 15 pr Gloves Peri-Wound Yu Skin Prep Discharge Instruction: Use skin prep as directed Topical Primary Dressing Cutimed Sorbact Swab Discharge Instruction: Apply to wound bed as instructed Oasis tri-layer PAXTON, PAMPLONA A ([id number]) [id number] Page 4 of 12 Secondary Dressing Woven Gauze Sponge, Non-Sterile 4x4 in Discharge Instruction: Apply over primary dressing as directed. Secured With [employer], 4.5x3.1 (in/yd) Discharge Instruction: Secure with Kerlix as directed. 73M Medipore Soft Cloth Surgical T 2x10 (in/yd) ape Discharge Instruction: Secure with tape as directed. Compression Wrap Compression Stockings Add-Ons Wound #2 (Gluteus) Wound Laterality: Right Cleanser Normal Saline Discharge Instruction: Cleanse the wound with Normal Saline prior to applying a clean dressing using gauze sponges, not tissue or cotton balls. Peri-Wound Yu Skin Prep Discharge Instruction: Use skin prep as directed Topical Primary Dressing Dakin's Solution 0.25%, 16 (oz) Discharge Instruction: Moisten gauze with Dakin's solution Promogran Prisma Matrix, 4.34 (sq in) (silver collagen) Discharge Instruction: Moisten collagen with saline or hydrogel cotton tipped applicators Discharge Instruction: use to pack wound Secondary Dressing Zetuvit Plus Silicone Border Dressing 5x5 (in/in) Discharge Instruction: Apply silicone border over primary dressing as directed.  Secured With 87M Medipore H Soft Cloth Surgical T ape, 4 x 10 (in/yd) Discharge Instruction: Secure with tape as directed. Compression Wrap Compression Stockings Add-Ons Wound #4 (Ankle) Wound Laterality: Left, Lateral Cleanser Normal Saline Discharge Instruction: Cleanse the wound with Normal Saline prior to applying a clean dressing using gauze sponges, not tissue or cotton balls. Byram Ancillary Kit - 15 Day Supply Discharge  Instruction: Use supplies as instructed; Kit contains: (15) Saline Bullets; (15) 3x3 Gauze; 15 pr Gloves Peri-Wound Yu Skin Prep Discharge Instruction: Use skin prep as directed Topical Primary Dressing Cutimed Sorbact Swab Discharge Instruction: Apply to wound bed as instructed Oasis tri-layer Secondary Dressing Woven Gauze Sponge, Non-Sterile 4x4 in Discharge Instruction: Apply over primary dressing as directed. Secured With EZEKIEL, COUEY ([id number]) [id number] Page 5 of 12 Kerlix Roll Sterile, 4.5x3.1 (in/yd) Discharge Instruction: Secure with Kerlix as directed. 87M Medipore Soft Cloth Surgical T 2x10 (in/yd) ape Discharge Instruction: Secure with tape as directed. Compression Wrap Compression Stockings Add-Ons Electronic Signature(s) Signed: 08/31/2022 2:23:26 PM By: Duanne Guess MD FACS Signed: 08/31/2022 3:51:43 PM By: Zenaida Deed RN, BSN Entered By: Duanne Guess on 08/31/2022 14:23:26 -------------------------------------------------------------------------------- Multi-Disciplinary Yu Plan Details Patient Name: Date of Service: Andrea Yu 08/31/2022 1:15 PM Medical Record Number: [id number] Patient Account Number: [id number] Date of Birth/Sex: Treating RN: 09-05-1940 (81 y.o. Billy Coast, Linda Primary Yu Kyrin Gratz: Jarome Matin Other Clinician: Referring Ianmichael Amescua: Treating [name]/Extender: Marena Chancy in Treatment: 40 Multidisciplinary Yu Plan reviewed with physician Active Inactive Pressure Nursing Diagnoses: Knowledge deficit related to causes and risk factors for pressure ulcer development Knowledge deficit related to management of pressures ulcers Potential for impaired tissue integrity related to pressure, friction, moisture, and shear Goals: Patient/caregiver will verbalize understanding of pressure ulcer management Date Initiated: 11/24/2021 Target Resolution Date:  09/24/2022 Goal Status: Active Interventions: Assess: immobility, friction, shearing, incontinence upon admission and as needed Assess offloading mechanisms upon admission and as needed Assess potential for pressure ulcer upon admission and as needed Provide education on pressure ulcers Treatment Activities: Pressure reduction/relief device ordered : 11/24/2021 Notes: Wound/Skin Impairment Nursing Diagnoses: Impaired tissue integrity Knowledge deficit related to ulceration/compromised skin integrity Goals: Patient/caregiver will verbalize understanding of skin Yu regimen Date Initiated: 11/24/2021 Target Resolution Date: 09/24/2022 Goal Status: Active Ulcer/skin breakdown will have a volume reduction of 30% by week 4 Date Initiated: 11/24/2021 Date Inactivated: 05/31/2022 Target Resolution Date: 05/25/2022 Unmet Reason: refuses VAC, Goal Status: Unmet noncompliant with offloading EDRIE, NEUMAN A ([id number]) [phone].pdf Page 6 of 12 Interventions: Assess patient/caregiver ability to obtain necessary supplies Assess patient/caregiver ability to perform ulcer/skin Yu regimen upon admission and as needed Assess ulceration(s) every visit Provide education on ulcer and skin Yu Treatment Activities: Skin Yu regimen initiated : 11/24/2021 Topical wound management initiated : 11/24/2021 Notes: Electronic Signature(s) Signed: 08/31/2022 3:51:43 PM By: Zenaida Deed RN, BSN Entered By: Zenaida Deed on 08/31/2022 13:43:51 -------------------------------------------------------------------------------- Pain Assessment Details Patient Name: Date of Service: Andrea Yu 08/31/2022 1:15 PM Medical Record Number: [id number] Patient Account Number: [id number] Date of Birth/Sex: Treating RN: 1940/04/19 (82 y.o. Andrea Yu Crystalee Ventress: Jarome Matin Other Clinician: Referring Wadie Mattie: Treating Gerasimos Plotts/Extender: Marena Chancy in Treatment: 40 Active Problems Location of Pain Severity and Description of Pain Patient Has Paino No Site Locations Rate the pain. Current Pain Level: 0 Pain Management and Medication Current Pain Management: Electronic Signature(s) Signed: 08/31/2022 3:51:43 PM By: Zenaida Deed RN, BSN Entered By: Zenaida Deed on  08/31/2022 13:24:25 EVGENIA, LUSCOMBE A ([id number]) [phone].pdf Page 7 of 12 -------------------------------------------------------------------------------- Patient/Caregiver Education Details Patient Name: Date of Service: Andrea Yu 8/6/2024andnbsp1:15 PM Medical Record Number: [id number] Patient Account Number: [id number] Date of Birth/Gender: Treating RN: Sep 15, 1940 (82 y.o. Andrea Yu Physician: Jarome Matin Other Clinician: Referring Physician: Treating Physician/Extender: Marena Chancy in Treatment: 40 Education Assessment Education Provided To: Patient Education Topics Provided Pressure: Methods: Explain/Verbal Responses: Reinforcements needed, State content correctly Wound/Skin Impairment: Methods: Explain/Verbal Responses: Reinforcements needed, State content correctly Electronic Signature(s) Signed: 08/31/2022 3:51:43 PM By: Zenaida Deed RN, BSN Entered By: Zenaida Deed on 08/31/2022 13:44:14 -------------------------------------------------------------------------------- Wound Assessment Details Patient Name: Date of Service: Andrea Yu 08/31/2022 1:15 PM Medical Record Number: [id number] Patient Account Number: [id number] Date of Birth/Sex: Treating RN: 1940-07-17 (82 y.o. Andrea Yu Erza Mothershead: Jarome Matin Other Clinician: Referring Journey Ratterman: Treating Grazia Taffe/Extender: Marena Chancy in Treatment: 40 Wound Status Wound Number: 1 Primary Pressure  Ulcer Etiology: Wound Location: Right, Medial Malleolus Wound Open Wounding Event: Pressure Injury Status: Date Acquired: 11/16/2021 Comorbid Cataracts, Arrhythmia, Hypertension, Osteoarthritis, Weeks Of Treatment: 40 History: Osteomyelitis, Neuropathy, Confinement Anxiety Clustered Wound: No Photos Wound Measurements Length: (cm) 1 Width: (cm) 0.7 Depth: (cm) 0.1 Area: (cm) 0. REVELLA, LIKE A ([id number]) Volume: (cm) % Reduction in Area: 64.3% % Reduction in Volume: 64.3% Epithelialization: Small (1-33%) 55 Tunneling: No [id number] Page 8 of 12 0.055 Undermining: No Wound Description Classification: Category/Stage III Wound Margin: Distinct, outline attached Exudate Amount: Medium Exudate Type: Serosanguineous Exudate Color: red, brown Foul Odor After Cleansing: No Slough/Fibrino Yes Wound Bed Granulation Amount: Large (67-100%) Exposed Structure Granulation Quality: Pink, Pale Fascia Exposed: No Necrotic Amount: Small (1-33%) Fat Layer (Subcutaneous Tissue) Exposed: Yes Necrotic Quality: Adherent Slough Tendon Exposed: No Muscle Exposed: No Joint Exposed: No Bone Exposed: No Periwound Skin Texture Texture Color No Abnormalities Noted: Yes No Abnormalities Noted: No Erythema: No Moisture No Abnormalities Noted: Yes Temperature / Pain Temperature: No Abnormality Treatment Notes Wound #1 (Malleolus) Wound Laterality: Right, Medial Cleanser Normal Saline Discharge Instruction: Cleanse the wound with Normal Saline prior to applying a clean dressing using gauze sponges, not tissue or cotton balls. Byram Ancillary Kit - 15 Day Supply Discharge Instruction: Use supplies as instructed; Kit contains: (15) Saline Bullets; (15) 3x3 Gauze; 15 pr Gloves Peri-Wound Yu Skin Prep Discharge Instruction: Use skin prep as directed Topical Primary Dressing Cutimed Sorbact Swab Discharge Instruction: Apply to wound bed as instructed Oasis  tri-layer Secondary Dressing Woven Gauze Sponge, Non-Sterile 4x4 in Discharge Instruction: Apply over primary dressing as directed. Secured With [employer], 4.5x3.1 (in/yd) Discharge Instruction: Secure with Kerlix as directed. 74M Medipore Soft Cloth Surgical T 2x10 (in/yd) ape Discharge Instruction: Secure with tape as directed. Compression Wrap Compression Stockings Add-Ons Electronic Signature(s) Signed: 08/31/2022 3:51:43 PM By: Zenaida Deed RN, BSN Entered By: Zenaida Deed on 08/31/2022 13:41:57 [name] ([id number]) [id number] Page 9 of 12 -------------------------------------------------------------------------------- Wound Assessment Details Patient Name: Date of Service: Andrea Yu 08/31/2022 1:15 PM Medical Record Number: [id number] Patient Account Number: [id number] Date of Birth/Sex: Treating RN: 1940-11-14 (82 y.o. Andrea Yu Paticia Moster: Jarome Matin Other Clinician: Referring Milly Goggins: Treating Rosabell Geyer/Extender: Marena Chancy in Treatment: 40 Wound Status Wound Number: 2 Primary Abscess Etiology: Wound Location: Right Gluteus Wound Open Wounding Event: Bump Status: Date Acquired: 09/28/2021 Comorbid Cataracts, Arrhythmia, Hypertension, Osteoarthritis, Weeks Of Treatment: 40 History: Osteomyelitis, Neuropathy, Confinement  Anxiety Clustered Wound: No Photos Wound Measurements Length: (cm) 0.8 Width: (cm) 1.7 Depth: (cm) 2.7 Area: (cm) 1.068 Volume: (cm) 2.884 % Reduction in Area: 89.8% % Reduction in Volume: 90.8% Epithelialization: Small (1-33%) Tunneling: No Undermining: No Wound Description Classification: Full Thickness With Exposed Support Structures Wound Margin: Epibole Exudate Amount: Medium Exudate Type: Serous Exudate Color: amber Foul Odor After Cleansing: No Slough/Fibrino Yes Wound Bed Granulation Amount: Large (67-100%) Exposed  Structure Granulation Quality: Red Fascia Exposed: No Necrotic Amount: Small (1-33%) Fat Layer (Subcutaneous Tissue) Exposed: Yes Necrotic Quality: Adherent Slough Tendon Exposed: No Muscle Exposed: No Joint Exposed: No Bone Exposed: No Periwound Skin Texture Texture Color No Abnormalities Noted: Yes No Abnormalities Noted: Yes Moisture Temperature / Pain No Abnormalities Noted: Yes Temperature: No Abnormality Tenderness on Palpation: Yes Treatment Notes Wound #2 (Gluteus) Wound Laterality: Right Cleanser Normal Saline Discharge Instruction: Cleanse the wound with Normal Saline prior to applying a clean dressing using gauze sponges, not tissue or cotton balls. Peri-Wound Yu Skin Prep Discharge Instruction: Use skin prep as directed KEYLEEN, VANDENBOSSCHE A ([id number]) [id number] Page 10 of 12 Topical Primary Dressing Dakin's Solution 0.25%, 16 (oz) Discharge Instruction: Moisten gauze with Dakin's solution Promogran Prisma Matrix, 4.34 (sq in) (silver collagen) Discharge Instruction: Moisten collagen with saline or hydrogel cotton tipped applicators Discharge Instruction: use to pack wound Secondary Dressing Zetuvit Plus Silicone Border Dressing 5x5 (in/in) Discharge Instruction: Apply silicone border over primary dressing as directed. Secured With 78M Medipore H Soft Cloth Surgical T ape, 4 x 10 (in/yd) Discharge Instruction: Secure with tape as directed. Compression Wrap Compression Stockings Add-Ons Electronic Signature(s) Signed: 08/31/2022 3:51:43 PM By: Zenaida Deed RN, BSN Entered By: Zenaida Deed on 08/31/2022 13:42:28 -------------------------------------------------------------------------------- Wound Assessment Details Patient Name: Date of Service: Andrea Yu 08/31/2022 1:15 PM Medical Record Number: [id number] Patient Account Number: [id number] Date of Birth/Sex: Treating RN: 03-24-1940 (82 y.o. Andrea Standard Primary  Yu Harika Laidlaw: Jarome Matin Other Clinician: Referring Evelean Bigler: Treating Hassan Blackshire/Extender: Marena Chancy in Treatment: 40 Wound Status Wound Number: 4 Primary Pressure Ulcer Etiology: Wound Location: Left, Lateral Ankle Wound Open Wounding Event: Pressure Injury Status: Date Acquired: 12/23/2021 Comorbid Cataracts, Arrhythmia, Hypertension, Osteoarthritis, Weeks Of Treatment: 34 History: Osteomyelitis, Neuropathy, Confinement Anxiety Clustered Wound: No Photos Wound Measurements Length: (cm) 0.7 Width: (cm) 0.6 Depth: (cm) 0.4 Area: (cm) 0.33 Volume: (cm) 0.132 Weppler, Kenise A ([id number]) Wound Description Classification: Category/Stage IV Wound Margin: Distinct, outline attached Exudate Amount: Medium Exudate Type: Serosanguineous Exudate Color: red, brown Foul Odor After Cleansing: No Slough/Fibrino Yes % Reduction in Area: -134% % Reduction in Volume: -842.9% Epithelialization: None Tunneling: No Undermining: No [id number] Page 11 of 12 Wound Bed Granulation Amount: Medium (34-66%) Exposed Structure Granulation Quality: Pink, Pale Fascia Exposed: No Necrotic Amount: Medium (34-66%) Fat Layer (Subcutaneous Tissue) Exposed: Yes Necrotic Quality: Adherent Slough Tendon Exposed: Yes Muscle Exposed: No Joint Exposed: No Bone Exposed: No Periwound Skin Texture Texture Color No Abnormalities Noted: Yes No Abnormalities Noted: Yes Moisture Temperature / Pain No Abnormalities Noted: Yes Temperature: No Abnormality Tenderness on Palpation: Yes Treatment Notes Wound #4 (Ankle) Wound Laterality: Left, Lateral Cleanser Normal Saline Discharge Instruction: Cleanse the wound with Normal Saline prior to applying a clean dressing using gauze sponges, not tissue or cotton balls. Byram Ancillary Kit - 15 Day Supply Discharge Instruction: Use supplies as instructed; Kit contains: (15) Saline Bullets; (15) 3x3  Gauze; 15 pr Gloves Peri-Wound Yu Skin Prep Discharge Instruction: Use skin prep as directed Topical Primary  Dressing Cutimed Sorbact Swab Discharge Instruction: Apply to wound bed as instructed Oasis tri-layer Secondary Dressing Woven Gauze Sponge, Non-Sterile 4x4 in Discharge Instruction: Apply over primary dressing as directed. Secured With [employer], 4.5x3.1 (in/yd) Discharge Instruction: Secure with Kerlix as directed. 55M Medipore Soft Cloth Surgical T 2x10 (in/yd) ape Discharge Instruction: Secure with tape as directed. Compression Wrap Compression Stockings Add-Ons Electronic Signature(s) Signed: 08/31/2022 3:51:43 PM By: Zenaida Deed RN, BSN Entered By: Zenaida Deed on 08/31/2022 13:43:09 Vitals Details -------------------------------------------------------------------------------- [name] ([id number]) [id number] Page 12 of 12 Patient Name: Date of Service: Andrea Yu 08/31/2022 1:15 PM Medical Record Number: [id number] Patient Account Number: [id number] Date of Birth/Sex: Treating RN: 07-21-1940 (82 y.o. Andrea Yu Roma Bierlein: Jarome Matin Other Clinician: Referring Jahnasia Tatum: Treating Monti Jilek/Extender: Marena Chancy in Treatment: 40 Vital Signs Time Taken: 13:22 Temperature (F): 98.6 Height (in): 59 Pulse (bpm): 76 Weight (lbs): 155 Respiratory Rate (breaths/min): 18 Body Mass Index (BMI): 31.3 Blood Pressure (mmHg): 136/70 Reference Range: 80 - 120 mg / dl Electronic Signature(s) Signed: 08/31/2022 3:51:43 PM By: Zenaida Deed RN, BSN Entered By: Zenaida Deed on 08/31/2022 13:24:17

## 2022-08-31 NOTE — Progress Notes (Addendum)
Week/30 Days Discharge Instructions: Secure with Kerlix as directed. Secured With: 372M Medipore Scientist, research (life sciences) Surgical T 2x10 (in/yd) (Generic) 1 x Per Week/30 Days ape Discharge Instructions: Secure with tape as directed. Wound #2 - Gluteus Wound Laterality: Right Cleanser: Normal Saline (Generic) 1 x Per Day/30 Days Discharge Instructions: Cleanse the wound with Normal Saline prior to applying Yu clean dressing using gauze sponges, not tissue or cotton balls. Peri-Wound Care: Skin Prep (Generic) 1 x Per Day/30 Days Discharge Instructions: Use skin prep as directed Prim Dressing: Dakin's Solution 0.25%, 16 (oz) (Generic) 1 x Per Day/30 Days ary Discharge Instructions: Moisten gauze with Dakin's solution Prim Dressing: Promogran Prisma Matrix, 4.34 (sq in) (silver collagen) (DME) (Dispense As Written) 1 x Per Day/30 Days ary Discharge Instructions: Moisten collagen with saline or hydrogel Prim Dressing: cotton tipped  applicators (DME) (Generic) 1 x Per Day/30 Days ary Discharge Instructions: use to pack wound Secondary Dressing: Zetuvit Plus Silicone Border Dressing 5x5 (in/in) (DME) (Generic) 1 x Per Day/30 Days Discharge Instructions: Apply silicone border over primary dressing as directed. Secured With: 372M Medipore H Soft Cloth Surgical T ape, 4 x 10 (in/yd) (DME) (Generic) 1 x Per Day/30 Days Discharge Instructions: Secure with tape as directed. Wound #4 - Ankle Wound Laterality: Left, Lateral Cleanser: Normal Saline (Generic) 1 x Per Week/30 Days Discharge Instructions: Cleanse the wound with Normal Saline prior to applying Yu clean dressing using gauze sponges, not tissue or cotton balls. Cleanser: Byram Ancillary Kit - 15 Day Supply (Generic) 1 x Per Week/30 Days Discharge Instructions: Use supplies as instructed; Kit contains: (15) Saline Bullets; (15) 3x3 Gauze; 15 pr Gloves Peri-Wound Care: Skin Prep (Generic) 1 x Per Week/30 Days Discharge Instructions: Use skin prep as directed Prim Dressing: Cutimed Sorbact Swab 1 x Per Week/30 Days ary Discharge Instructions: Apply to wound bed as instructed Prim Dressing: Oasis tri-layer ary 1 x Per Week/30 Days Secondary Dressing: Woven Gauze Sponge, Non-Sterile 4x4 in 1 x Per Week/30 Days Discharge Instructions: Apply over primary dressing as directed. Secured With: [employer], 4.5x3.1 (in/yd) 1 x Per Week/30 Days Discharge Instructions: Secure with Kerlix as directed. Secured With: 372M Medipore Scientist, research (life sciences) Surgical T 2x10 (in/yd) (Generic) 1 x Per Week/30 Days ape Discharge Instructions: Secure with tape as directed. Electronic Signature(s) Signed: 08/31/2022 4:07:09 PM By: Duanne Guess MD FACS Entered By: Duanne Guess on 08/31/2022 11:25:59 Andrea Yu ([id number]) [id number] Page 8 of 15 -------------------------------------------------------------------------------- Problem List Details Patient Name:  Date of Service: Andrea Yu 08/31/2022 1:15 PM Medical Record Number: [id number] Patient Account Number: [id number] Date of Birth/Sex: Treating RN: [birth year] (82 y.o. 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 11/24/2021 No Yes Z79.52 Long term (current) use of systemic steroids 11/24/2021 No Yes I10 Essential (primary) hypertension 11/24/2021 No Yes Inactive Problems ICD-10 Code Description Active Date Inactive Date L97.812 Non-pressure chronic ulcer of other part of right lower leg with fat layer exposed 07/21/2022 07/21/2022 Resolved Problems ICD-10 Code Description Active Date Resolved Date L97.511 Non-pressure chronic ulcer of other part of right foot  Andrea Yu, Andrea Yu ([id number]) [id number] Page 1 of 15 Visit Report for 08/31/2022 Chief Complaint Document Details Patient Name: Date of Service: Andrea Yu 08/31/2022 1:15 PM Medical Record Number: [id number] Patient Account Number: [id number] Date of Birth/Sex: Treating RN: 07-Sep-1940 (82 y.o. [name] Primary Care Provider: Jarome Matin Other Clinician: Referring Provider: Treating Provider/Extender: Marena Chancy in Treatment: 40 Information Obtained from: Patient Chief Complaint Patient is at the clinic for treatment of an open pressure ulcer on her right medial ankle, and Yu large abscess on her right buttock Electronic Signature(s) Signed: 08/31/2022 2:23:37 PM By: Duanne Guess MD FACS Entered By: Duanne Guess on 08/31/2022 11:23:37 -------------------------------------------------------------------------------- Cellular or Tissue Based Product Details Patient Name: Date of Service: Andrea Yu 08/31/2022 1:15 PM Medical Record Number: [id number] Patient Account Number: [id number] Date of Birth/Sex: Treating RN: 02-Jan-1941 (81 y.o. [name] Primary Care Provider: Jarome Matin Other Clinician: Referring Provider: Treating Provider/Extender: Marena Chancy in Treatment: 40 Cellular or Tissue Based Product Type Wound #4 Left,Lateral Ankle Applied to: Performed By: Physician Duanne Guess, MD Cellular or Tissue Based Product Type: Oasis tri-layer Level of Consciousness (Pre-procedure): Awake and Alert Pre-procedure Verification/Time Out Yes - 13:55 Taken: Location: trunk / arms / legs Wound Size (sq cm): 0.42 Product Size (sq cm): 5 Waste Size (sq cm): 3 Waste Reason: wound size Amount of Product Applied (sq cm): 2 Instrument Used: Forceps, Scissors Lot #: [id number] Order #: 7 Expiration Date: 05/01/2024 Fenestrated: No Reconstituted: Yes Solution  Type: saline Solution Amount: 5 ml Lot #: [id number] KS Solution Expiration Date: 02/16/2024 Secured: Yes Secured With: Steri-Strips Dressing Applied: Yes Primary Dressing: sorbact Procedural Pain: 0 Post Procedural Pain: 0 Response to Treatment: Procedure was tolerated well Andrea Yu, Andrea Yu ([id number]) [id number].pdf Page 2 of 15 Level of Consciousness (Post- Awake and Alert procedure): Post Procedure Diagnosis Same as Pre-procedure Notes Scribed for Dr. Lady Gary by Zenaida Deed, RN Electronic Signature(s) Signed: 08/31/2022 3:51:43 PM By: Zenaida Deed RN, BSN Signed: 08/31/2022 4:07:09 PM By: Duanne Guess MD FACS Entered By: Zenaida Deed on 08/31/2022 11:01:57 -------------------------------------------------------------------------------- Cellular or Tissue Based Product Details Patient Name: Date of Service: Andrea Yu 08/31/2022 1:15 PM Medical Record Number: [id number] Patient Account Number: [id number] Date of Birth/Sex: Treating RN: 11-Jun-1940 (81 y.o. [name] Primary Care Provider: Jarome Matin Other Clinician: Referring Provider: Treating Provider/Extender: Marena Chancy in Treatment: 40 Cellular or Tissue Based Product Type Wound #1 Right,Medial Malleolus Applied to: Performed By: Physician Duanne Guess, MD Cellular or Tissue Based Product Type: Oasis tri-layer Level of Consciousness (Pre-procedure): Awake and Alert Pre-procedure Verification/Time Out Yes - 13:55 Taken: Location: trunk / arms / legs Wound Size (sq cm): 0.7 Product Size (sq cm): 6 Waste Size (sq cm): 2 Waste Reason: wound size Amount of Product Applied (sq cm): 4 Instrument Used: Forceps, Scissors Lot #: [id number] Order #: 7 Expiration Date: 05/01/2024 Fenestrated: No Reconstituted: Yes Solution Type: saline Solution Amount: 5 ml Lot #: [id number] KS Solution Expiration Date: 02/16/2024 Secured: Yes Secured With:  Steri-Strips Dressing Applied: Yes Primary Dressing: sorbact Procedural Pain: 0 Post Procedural Pain: 0 Response to Treatment: Procedure was tolerated well Level of Consciousness (Post- Awake and Alert procedure): Post Procedure Diagnosis Same as Pre-procedure Notes Scribed for Dr. Lady Gary by Zenaida Deed, RN Electronic Signature(s) Signed: 09/16/2022 3:06:47 PM By: Duanne Guess MD FACS Signed: 10/11/2022 4:46:56 PM By: Zenaida Deed RN, BSN Previous Signature: 08/31/2022 3:51:43 PM Version By:  Andrea Yu, Andrea Yu ([id number]) [id number] Page 1 of 15 Visit Report for 08/31/2022 Chief Complaint Document Details Patient Name: Date of Service: Andrea Yu 08/31/2022 1:15 PM Medical Record Number: [id number] Patient Account Number: [id number] Date of Birth/Sex: Treating RN: 07-Sep-1940 (82 y.o. [name] Primary Care Provider: Jarome Matin Other Clinician: Referring Provider: Treating Provider/Extender: Marena Chancy in Treatment: 40 Information Obtained from: Patient Chief Complaint Patient is at the clinic for treatment of an open pressure ulcer on her right medial ankle, and Yu large abscess on her right buttock Electronic Signature(s) Signed: 08/31/2022 2:23:37 PM By: Duanne Guess MD FACS Entered By: Duanne Guess on 08/31/2022 11:23:37 -------------------------------------------------------------------------------- Cellular or Tissue Based Product Details Patient Name: Date of Service: Andrea Yu 08/31/2022 1:15 PM Medical Record Number: [id number] Patient Account Number: [id number] Date of Birth/Sex: Treating RN: 02-Jan-1941 (81 y.o. [name] Primary Care Provider: Jarome Matin Other Clinician: Referring Provider: Treating Provider/Extender: Marena Chancy in Treatment: 40 Cellular or Tissue Based Product Type Wound #4 Left,Lateral Ankle Applied to: Performed By: Physician Duanne Guess, MD Cellular or Tissue Based Product Type: Oasis tri-layer Level of Consciousness (Pre-procedure): Awake and Alert Pre-procedure Verification/Time Out Yes - 13:55 Taken: Location: trunk / arms / legs Wound Size (sq cm): 0.42 Product Size (sq cm): 5 Waste Size (sq cm): 3 Waste Reason: wound size Amount of Product Applied (sq cm): 2 Instrument Used: Forceps, Scissors Lot #: [id number] Order #: 7 Expiration Date: 05/01/2024 Fenestrated: No Reconstituted: Yes Solution  Type: saline Solution Amount: 5 ml Lot #: [id number] KS Solution Expiration Date: 02/16/2024 Secured: Yes Secured With: Steri-Strips Dressing Applied: Yes Primary Dressing: sorbact Procedural Pain: 0 Post Procedural Pain: 0 Response to Treatment: Procedure was tolerated well Andrea Yu, Andrea Yu ([id number]) [id number].pdf Page 2 of 15 Level of Consciousness (Post- Awake and Alert procedure): Post Procedure Diagnosis Same as Pre-procedure Notes Scribed for Dr. Lady Gary by Zenaida Deed, RN Electronic Signature(s) Signed: 08/31/2022 3:51:43 PM By: Zenaida Deed RN, BSN Signed: 08/31/2022 4:07:09 PM By: Duanne Guess MD FACS Entered By: Zenaida Deed on 08/31/2022 11:01:57 -------------------------------------------------------------------------------- Cellular or Tissue Based Product Details Patient Name: Date of Service: Andrea Yu 08/31/2022 1:15 PM Medical Record Number: [id number] Patient Account Number: [id number] Date of Birth/Sex: Treating RN: 11-Jun-1940 (81 y.o. [name] Primary Care Provider: Jarome Matin Other Clinician: Referring Provider: Treating Provider/Extender: Marena Chancy in Treatment: 40 Cellular or Tissue Based Product Type Wound #1 Right,Medial Malleolus Applied to: Performed By: Physician Duanne Guess, MD Cellular or Tissue Based Product Type: Oasis tri-layer Level of Consciousness (Pre-procedure): Awake and Alert Pre-procedure Verification/Time Out Yes - 13:55 Taken: Location: trunk / arms / legs Wound Size (sq cm): 0.7 Product Size (sq cm): 6 Waste Size (sq cm): 2 Waste Reason: wound size Amount of Product Applied (sq cm): 4 Instrument Used: Forceps, Scissors Lot #: [id number] Order #: 7 Expiration Date: 05/01/2024 Fenestrated: No Reconstituted: Yes Solution Type: saline Solution Amount: 5 ml Lot #: [id number] KS Solution Expiration Date: 02/16/2024 Secured: Yes Secured With:  Steri-Strips Dressing Applied: Yes Primary Dressing: sorbact Procedural Pain: 0 Post Procedural Pain: 0 Response to Treatment: Procedure was tolerated well Level of Consciousness (Post- Awake and Alert procedure): Post Procedure Diagnosis Same as Pre-procedure Notes Scribed for Dr. Lady Gary by Zenaida Deed, RN Electronic Signature(s) Signed: 09/16/2022 3:06:47 PM By: Duanne Guess MD FACS Signed: 10/11/2022 4:46:56 PM By: Zenaida Deed RN, BSN Previous Signature: 08/31/2022 3:51:43 PM Version By:  Andrea Yu, Andrea Yu ([id number]) [id number] Page 1 of 15 Visit Report for 08/31/2022 Chief Complaint Document Details Patient Name: Date of Service: Andrea Yu 08/31/2022 1:15 PM Medical Record Number: [id number] Patient Account Number: [id number] Date of Birth/Sex: Treating RN: 07-Sep-1940 (82 y.o. [name] Primary Care Provider: Jarome Matin Other Clinician: Referring Provider: Treating Provider/Extender: Marena Chancy in Treatment: 40 Information Obtained from: Patient Chief Complaint Patient is at the clinic for treatment of an open pressure ulcer on her right medial ankle, and Yu large abscess on her right buttock Electronic Signature(s) Signed: 08/31/2022 2:23:37 PM By: Duanne Guess MD FACS Entered By: Duanne Guess on 08/31/2022 11:23:37 -------------------------------------------------------------------------------- Cellular or Tissue Based Product Details Patient Name: Date of Service: Andrea Yu 08/31/2022 1:15 PM Medical Record Number: [id number] Patient Account Number: [id number] Date of Birth/Sex: Treating RN: 02-Jan-1941 (81 y.o. [name] Primary Care Provider: Jarome Matin Other Clinician: Referring Provider: Treating Provider/Extender: Marena Chancy in Treatment: 40 Cellular or Tissue Based Product Type Wound #4 Left,Lateral Ankle Applied to: Performed By: Physician Duanne Guess, MD Cellular or Tissue Based Product Type: Oasis tri-layer Level of Consciousness (Pre-procedure): Awake and Alert Pre-procedure Verification/Time Out Yes - 13:55 Taken: Location: trunk / arms / legs Wound Size (sq cm): 0.42 Product Size (sq cm): 5 Waste Size (sq cm): 3 Waste Reason: wound size Amount of Product Applied (sq cm): 2 Instrument Used: Forceps, Scissors Lot #: [id number] Order #: 7 Expiration Date: 05/01/2024 Fenestrated: No Reconstituted: Yes Solution  Type: saline Solution Amount: 5 ml Lot #: [id number] KS Solution Expiration Date: 02/16/2024 Secured: Yes Secured With: Steri-Strips Dressing Applied: Yes Primary Dressing: sorbact Procedural Pain: 0 Post Procedural Pain: 0 Response to Treatment: Procedure was tolerated well Andrea Yu, Andrea Yu ([id number]) [id number].pdf Page 2 of 15 Level of Consciousness (Post- Awake and Alert procedure): Post Procedure Diagnosis Same as Pre-procedure Notes Scribed for Dr. Lady Gary by Zenaida Deed, RN Electronic Signature(s) Signed: 08/31/2022 3:51:43 PM By: Zenaida Deed RN, BSN Signed: 08/31/2022 4:07:09 PM By: Duanne Guess MD FACS Entered By: Zenaida Deed on 08/31/2022 11:01:57 -------------------------------------------------------------------------------- Cellular or Tissue Based Product Details Patient Name: Date of Service: Andrea Yu 08/31/2022 1:15 PM Medical Record Number: [id number] Patient Account Number: [id number] Date of Birth/Sex: Treating RN: 11-Jun-1940 (81 y.o. [name] Primary Care Provider: Jarome Matin Other Clinician: Referring Provider: Treating Provider/Extender: Marena Chancy in Treatment: 40 Cellular or Tissue Based Product Type Wound #1 Right,Medial Malleolus Applied to: Performed By: Physician Duanne Guess, MD Cellular or Tissue Based Product Type: Oasis tri-layer Level of Consciousness (Pre-procedure): Awake and Alert Pre-procedure Verification/Time Out Yes - 13:55 Taken: Location: trunk / arms / legs Wound Size (sq cm): 0.7 Product Size (sq cm): 6 Waste Size (sq cm): 2 Waste Reason: wound size Amount of Product Applied (sq cm): 4 Instrument Used: Forceps, Scissors Lot #: [id number] Order #: 7 Expiration Date: 05/01/2024 Fenestrated: No Reconstituted: Yes Solution Type: saline Solution Amount: 5 ml Lot #: [id number] KS Solution Expiration Date: 02/16/2024 Secured: Yes Secured With:  Steri-Strips Dressing Applied: Yes Primary Dressing: sorbact Procedural Pain: 0 Post Procedural Pain: 0 Response to Treatment: Procedure was tolerated well Level of Consciousness (Post- Awake and Alert procedure): Post Procedure Diagnosis Same as Pre-procedure Notes Scribed for Dr. Lady Gary by Zenaida Deed, RN Electronic Signature(s) Signed: 09/16/2022 3:06:47 PM By: Duanne Guess MD FACS Signed: 10/11/2022 4:46:56 PM By: Zenaida Deed RN, BSN Previous Signature: 08/31/2022 3:51:43 PM Version By:  Week/30 Days Discharge Instructions: Secure with Kerlix as directed. Secured With: 372M Medipore Scientist, research (life sciences) Surgical T 2x10 (in/yd) (Generic) 1 x Per Week/30 Days ape Discharge Instructions: Secure with tape as directed. Wound #2 - Gluteus Wound Laterality: Right Cleanser: Normal Saline (Generic) 1 x Per Day/30 Days Discharge Instructions: Cleanse the wound with Normal Saline prior to applying Yu clean dressing using gauze sponges, not tissue or cotton balls. Peri-Wound Care: Skin Prep (Generic) 1 x Per Day/30 Days Discharge Instructions: Use skin prep as directed Prim Dressing: Dakin's Solution 0.25%, 16 (oz) (Generic) 1 x Per Day/30 Days ary Discharge Instructions: Moisten gauze with Dakin's solution Prim Dressing: Promogran Prisma Matrix, 4.34 (sq in) (silver collagen) (DME) (Dispense As Written) 1 x Per Day/30 Days ary Discharge Instructions: Moisten collagen with saline or hydrogel Prim Dressing: cotton tipped  applicators (DME) (Generic) 1 x Per Day/30 Days ary Discharge Instructions: use to pack wound Secondary Dressing: Zetuvit Plus Silicone Border Dressing 5x5 (in/in) (DME) (Generic) 1 x Per Day/30 Days Discharge Instructions: Apply silicone border over primary dressing as directed. Secured With: 372M Medipore H Soft Cloth Surgical T ape, 4 x 10 (in/yd) (DME) (Generic) 1 x Per Day/30 Days Discharge Instructions: Secure with tape as directed. Wound #4 - Ankle Wound Laterality: Left, Lateral Cleanser: Normal Saline (Generic) 1 x Per Week/30 Days Discharge Instructions: Cleanse the wound with Normal Saline prior to applying Yu clean dressing using gauze sponges, not tissue or cotton balls. Cleanser: Byram Ancillary Kit - 15 Day Supply (Generic) 1 x Per Week/30 Days Discharge Instructions: Use supplies as instructed; Kit contains: (15) Saline Bullets; (15) 3x3 Gauze; 15 pr Gloves Peri-Wound Care: Skin Prep (Generic) 1 x Per Week/30 Days Discharge Instructions: Use skin prep as directed Prim Dressing: Cutimed Sorbact Swab 1 x Per Week/30 Days ary Discharge Instructions: Apply to wound bed as instructed Prim Dressing: Oasis tri-layer ary 1 x Per Week/30 Days Secondary Dressing: Woven Gauze Sponge, Non-Sterile 4x4 in 1 x Per Week/30 Days Discharge Instructions: Apply over primary dressing as directed. Secured With: [employer], 4.5x3.1 (in/yd) 1 x Per Week/30 Days Discharge Instructions: Secure with Kerlix as directed. Secured With: 372M Medipore Scientist, research (life sciences) Surgical T 2x10 (in/yd) (Generic) 1 x Per Week/30 Days ape Discharge Instructions: Secure with tape as directed. Electronic Signature(s) Signed: 08/31/2022 4:07:09 PM By: Duanne Guess MD FACS Entered By: Duanne Guess on 08/31/2022 11:25:59 Andrea Yu ([id number]) [id number] Page 8 of 15 -------------------------------------------------------------------------------- Problem List Details Patient Name:  Date of Service: Andrea Yu 08/31/2022 1:15 PM Medical Record Number: [id number] Patient Account Number: [id number] Date of Birth/Sex: Treating RN: [birth year] (82 y.o. 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 11/24/2021 No Yes Z79.52 Long term (current) use of systemic steroids 11/24/2021 No Yes I10 Essential (primary) hypertension 11/24/2021 No Yes Inactive Problems ICD-10 Code Description Active Date Inactive Date L97.812 Non-pressure chronic ulcer of other part of right lower leg with fat layer exposed 07/21/2022 07/21/2022 Resolved Problems ICD-10 Code Description Active Date Resolved Date L97.511 Non-pressure chronic ulcer of other part of right foot  Andrea Yu, Andrea Yu ([id number]) [id number] Page 1 of 15 Visit Report for 08/31/2022 Chief Complaint Document Details Patient Name: Date of Service: Andrea Yu 08/31/2022 1:15 PM Medical Record Number: [id number] Patient Account Number: [id number] Date of Birth/Sex: Treating RN: 07-Sep-1940 (82 y.o. [name] Primary Care Provider: Jarome Matin Other Clinician: Referring Provider: Treating Provider/Extender: Marena Chancy in Treatment: 40 Information Obtained from: Patient Chief Complaint Patient is at the clinic for treatment of an open pressure ulcer on her right medial ankle, and Yu large abscess on her right buttock Electronic Signature(s) Signed: 08/31/2022 2:23:37 PM By: Duanne Guess MD FACS Entered By: Duanne Guess on 08/31/2022 11:23:37 -------------------------------------------------------------------------------- Cellular or Tissue Based Product Details Patient Name: Date of Service: Andrea Yu 08/31/2022 1:15 PM Medical Record Number: [id number] Patient Account Number: [id number] Date of Birth/Sex: Treating RN: 02-Jan-1941 (81 y.o. [name] Primary Care Provider: Jarome Matin Other Clinician: Referring Provider: Treating Provider/Extender: Marena Chancy in Treatment: 40 Cellular or Tissue Based Product Type Wound #4 Left,Lateral Ankle Applied to: Performed By: Physician Duanne Guess, MD Cellular or Tissue Based Product Type: Oasis tri-layer Level of Consciousness (Pre-procedure): Awake and Alert Pre-procedure Verification/Time Out Yes - 13:55 Taken: Location: trunk / arms / legs Wound Size (sq cm): 0.42 Product Size (sq cm): 5 Waste Size (sq cm): 3 Waste Reason: wound size Amount of Product Applied (sq cm): 2 Instrument Used: Forceps, Scissors Lot #: [id number] Order #: 7 Expiration Date: 05/01/2024 Fenestrated: No Reconstituted: Yes Solution  Type: saline Solution Amount: 5 ml Lot #: [id number] KS Solution Expiration Date: 02/16/2024 Secured: Yes Secured With: Steri-Strips Dressing Applied: Yes Primary Dressing: sorbact Procedural Pain: 0 Post Procedural Pain: 0 Response to Treatment: Procedure was tolerated well Andrea Yu, Andrea Yu ([id number]) [phone].pdf Page 2 of 15 Level of Consciousness (Post- Awake and Alert procedure): Post Procedure Diagnosis Same as Pre-procedure Notes Scribed for Dr. Lady Gary by Zenaida Deed, RN Electronic Signature(s) Signed: 08/31/2022 3:51:43 PM By: Zenaida Deed RN, BSN Signed: 08/31/2022 4:07:09 PM By: Duanne Guess MD FACS Entered By: Zenaida Deed on 08/31/2022 11:01:57 -------------------------------------------------------------------------------- Cellular or Tissue Based Product Details Patient Name: Date of Service: Andrea Yu 08/31/2022 1:15 PM Medical Record Number: [id number] Patient Account Number: [id number] Date of Birth/Sex: Treating RN: 11-Jun-1940 (81 y.o. [name] Primary Care Provider: Jarome Matin Other Clinician: Referring Provider: Treating Provider/Extender: Marena Chancy in Treatment: 40 Cellular or Tissue Based Product Type Wound #1 Right,Medial Malleolus Applied to: Performed By: Physician Duanne Guess, MD Cellular or Tissue Based Product Type: Oasis tri-layer Level of Consciousness (Pre-procedure): Awake and Alert Pre-procedure Verification/Time Out Yes - 13:55 Taken: Location: trunk / arms / legs Wound Size (sq cm): 0.7 Product Size (sq cm): 6 Waste Size (sq cm): 2 Waste Reason: wound size Amount of Product Applied (sq cm): 4 Instrument Used: Forceps, Scissors Lot #: [id number] Order #: 7 Expiration Date: 05/01/2024 Fenestrated: No Reconstituted: Yes Solution Type: saline Solution Amount: 5 ml Lot #: [id number] KS Solution Expiration Date: 02/16/2024 Secured: Yes Secured With:  Steri-Strips Dressing Applied: Yes Primary Dressing: sorbact Procedural Pain: 0 Post Procedural Pain: 0 Response to Treatment: Procedure was tolerated well Level of Consciousness (Post- Awake and Alert procedure): Post Procedure Diagnosis Same as Pre-procedure Notes Scribed for Dr. Lady Gary by Zenaida Deed, RN Electronic Signature(s) Signed: 09/16/2022 3:06:47 PM By: Duanne Guess MD FACS Signed: 10/11/2022 4:46:56 PM By: Zenaida Deed RN, BSN Previous Signature: 08/31/2022 3:51:43 PM Version By:  Eather Colas, BSN Previous Signature: 08/31/2022 4:07:09 PM Version By: Duanne Guess MD Karis Juba ([id number]) [id number] Page 3 of 15 Entered By: Zenaida Deed on 09/15/2022 08:57:08 -------------------------------------------------------------------------------- Debridement Details Patient Name: Date of Service: Andrea Yu 08/31/2022 1:15 PM Medical Record Number: [id number] Patient Account Number: [id number] Date of Birth/Sex: Treating RN: 1940/06/05 (82 y.o. [name] Primary Care Provider: Jarome Matin Other Clinician: Referring Provider: Treating Provider/Extender: Marena Chancy in Treatment: 40 Debridement Performed for Assessment: Wound #1 Right,Medial Malleolus Performed By: Physician Duanne Guess, MD Debridement Type: Debridement Level of Consciousness (Pre-procedure): Awake and Alert Pre-procedure Verification/Time Out Yes - 13:45 Taken: Start Time: 13:50 Pain Control: Lidocaine 4% Topical Solution Percent of Wound Bed Debrided: 100% T Area Debrided (cm): otal 0.55 Tissue and other material debrided: Non-Viable, Eschar, Slough, Slough Level: Non-Viable Tissue Debridement Description: Selective/Open Wound Instrument: Curette Bleeding: None Procedural Pain: 0 Post Procedural Pain: 0 Response to Treatment: Procedure was tolerated well Level of Consciousness (Post- Awake  and Alert procedure): Post Debridement Measurements of Total Wound Length: (cm) 1 Stage: Category/Stage III Width: (cm) 0.7 Depth: (cm) 0.1 Volume: (cm) 0.055 Character of Wound/Ulcer Post Debridement: Stable Post Procedure Diagnosis Same as Pre-procedure Notes Scribed for Dr. Lady Gary by Zenaida Deed, RN Electronic Signature(s) Signed: 08/31/2022 3:51:43 PM By: Zenaida Deed RN, BSN Signed: 08/31/2022 4:07:09 PM By: Duanne Guess MD FACS Entered By: Zenaida Deed on 08/31/2022 10:55:19 -------------------------------------------------------------------------------- Debridement Details Patient Name: Date of Service: Andrea Lain Yu. 08/31/2022 1:15 PM Medical Record Number: [id number] Patient Account Number: [id number] Date of Birth/Sex: Treating RN: 1940-07-04 (81 y.o. [name] Primary Care Provider: Jarome Matin Other Clinician: Referring Provider: Treating Provider/Extender: Marena Chancy in Treatment: 40 Debridement Performed for Assessment: Wound #4 Left,Lateral Ankle Performed By: Physician Duanne Guess, MD Debridement Type: Debridement Andrea Yu, Andrea Yu ([id number]) [id number] Page 4 of 15 Level of Consciousness (Pre-procedure): Awake and Alert Pre-procedure Verification/Time Out Yes - 13:45 Taken: Start Time: 13:50 Pain Control: Lidocaine 4% Topical Solution Percent of Wound Bed Debrided: 100% T Area Debrided (cm): otal 0.33 Tissue and other material debrided: Non-Viable, Eschar, Slough, Slough Level: Non-Viable Tissue Debridement Description: Selective/Open Wound Instrument: Curette Bleeding: None Procedural Pain: 0 Post Procedural Pain: 0 Response to Treatment: Procedure was tolerated well Level of Consciousness (Post- Awake and Alert procedure): Post Debridement Measurements of Total Wound Length: (cm) 0.7 Stage: Category/Stage IV Width: (cm) 0.6 Depth: (cm) 0.4 Volume: (cm)  0.132 Character of Wound/Ulcer Post Debridement: Stable Post Procedure Diagnosis Same as Pre-procedure Notes Scribed for Dr. Lady Gary by Zenaida Deed, RN Electronic Signature(s) Signed: 08/31/2022 3:51:43 PM By: Zenaida Deed RN, BSN Signed: 08/31/2022 4:07:09 PM By: Duanne Guess MD FACS Entered By: Zenaida Deed on 08/31/2022 10:57:08 -------------------------------------------------------------------------------- HPI Details Patient Name: Date of Service: Andrea Lain Yu. 08/31/2022 1:15 PM Medical Record Number: [id number] Patient Account Number: [id number] Date of Birth/Sex: Treating RN: July 08, 1940 (81 y.o. 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 She is not diabetic and quit smoking over 40 years ago. 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 She has developed Yu pressure ulcer on the right medial malleolus. In addition, she apparently  Week/30 Days Discharge Instructions: Secure with Kerlix as directed. Secured With: 372M Medipore Scientist, research (life sciences) Surgical T 2x10 (in/yd) (Generic) 1 x Per Week/30 Days ape Discharge Instructions: Secure with tape as directed. Wound #2 - Gluteus Wound Laterality: Right Cleanser: Normal Saline (Generic) 1 x Per Day/30 Days Discharge Instructions: Cleanse the wound with Normal Saline prior to applying Yu clean dressing using gauze sponges, not tissue or cotton balls. Peri-Wound Care: Skin Prep (Generic) 1 x Per Day/30 Days Discharge Instructions: Use skin prep as directed Prim Dressing: Dakin's Solution 0.25%, 16 (oz) (Generic) 1 x Per Day/30 Days ary Discharge Instructions: Moisten gauze with Dakin's solution Prim Dressing: Promogran Prisma Matrix, 4.34 (sq in) (silver collagen) (DME) (Dispense As Written) 1 x Per Day/30 Days ary Discharge Instructions: Moisten collagen with saline or hydrogel Prim Dressing: cotton tipped  applicators (DME) (Generic) 1 x Per Day/30 Days ary Discharge Instructions: use to pack wound Secondary Dressing: Zetuvit Plus Silicone Border Dressing 5x5 (in/in) (DME) (Generic) 1 x Per Day/30 Days Discharge Instructions: Apply silicone border over primary dressing as directed. Secured With: 372M Medipore H Soft Cloth Surgical T ape, 4 x 10 (in/yd) (DME) (Generic) 1 x Per Day/30 Days Discharge Instructions: Secure with tape as directed. Wound #4 - Ankle Wound Laterality: Left, Lateral Cleanser: Normal Saline (Generic) 1 x Per Week/30 Days Discharge Instructions: Cleanse the wound with Normal Saline prior to applying Yu clean dressing using gauze sponges, not tissue or cotton balls. Cleanser: Byram Ancillary Kit - 15 Day Supply (Generic) 1 x Per Week/30 Days Discharge Instructions: Use supplies as instructed; Kit contains: (15) Saline Bullets; (15) 3x3 Gauze; 15 pr Gloves Peri-Wound Care: Skin Prep (Generic) 1 x Per Week/30 Days Discharge Instructions: Use skin prep as directed Prim Dressing: Cutimed Sorbact Swab 1 x Per Week/30 Days ary Discharge Instructions: Apply to wound bed as instructed Prim Dressing: Oasis tri-layer ary 1 x Per Week/30 Days Secondary Dressing: Woven Gauze Sponge, Non-Sterile 4x4 in 1 x Per Week/30 Days Discharge Instructions: Apply over primary dressing as directed. Secured With: [employer], 4.5x3.1 (in/yd) 1 x Per Week/30 Days Discharge Instructions: Secure with Kerlix as directed. Secured With: 372M Medipore Scientist, research (life sciences) Surgical T 2x10 (in/yd) (Generic) 1 x Per Week/30 Days ape Discharge Instructions: Secure with tape as directed. Electronic Signature(s) Signed: 08/31/2022 4:07:09 PM By: Duanne Guess MD FACS Entered By: Duanne Guess on 08/31/2022 11:25:59 Andrea Yu ([id number]) [id number] Page 8 of 15 -------------------------------------------------------------------------------- Problem List Details Patient Name:  Date of Service: Andrea Yu 08/31/2022 1:15 PM Medical Record Number: [id number] Patient Account Number: [id number] Date of Birth/Sex: Treating RN: [birth year] (82 y.o. 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 11/24/2021 No Yes Z79.52 Long term (current) use of systemic steroids 11/24/2021 No Yes I10 Essential (primary) hypertension 11/24/2021 No Yes Inactive Problems ICD-10 Code Description Active Date Inactive Date L97.812 Non-pressure chronic ulcer of other part of right lower leg with fat layer exposed 07/21/2022 07/21/2022 Resolved Problems ICD-10 Code Description Active Date Resolved Date L97.511 Non-pressure chronic ulcer of other part of right foot  Eather Colas, BSN Previous Signature: 08/31/2022 4:07:09 PM Version By: Duanne Guess MD Karis Juba ([id number]) [id number] Page 3 of 15 Entered By: Zenaida Deed on 09/15/2022 08:57:08 -------------------------------------------------------------------------------- Debridement Details Patient Name: Date of Service: Andrea Yu 08/31/2022 1:15 PM Medical Record Number: [id number] Patient Account Number: [id number] Date of Birth/Sex: Treating RN: 1940/06/05 (82 y.o. [name] Primary Care Provider: Jarome Matin Other Clinician: Referring Provider: Treating Provider/Extender: Marena Chancy in Treatment: 40 Debridement Performed for Assessment: Wound #1 Right,Medial Malleolus Performed By: Physician Duanne Guess, MD Debridement Type: Debridement Level of Consciousness (Pre-procedure): Awake and Alert Pre-procedure Verification/Time Out Yes - 13:45 Taken: Start Time: 13:50 Pain Control: Lidocaine 4% Topical Solution Percent of Wound Bed Debrided: 100% T Area Debrided (cm): otal 0.55 Tissue and other material debrided: Non-Viable, Eschar, Slough, Slough Level: Non-Viable Tissue Debridement Description: Selective/Open Wound Instrument: Curette Bleeding: None Procedural Pain: 0 Post Procedural Pain: 0 Response to Treatment: Procedure was tolerated well Level of Consciousness (Post- Awake  and Alert procedure): Post Debridement Measurements of Total Wound Length: (cm) 1 Stage: Category/Stage III Width: (cm) 0.7 Depth: (cm) 0.1 Volume: (cm) 0.055 Character of Wound/Ulcer Post Debridement: Stable Post Procedure Diagnosis Same as Pre-procedure Notes Scribed for Dr. Lady Gary by Zenaida Deed, RN Electronic Signature(s) Signed: 08/31/2022 3:51:43 PM By: Zenaida Deed RN, BSN Signed: 08/31/2022 4:07:09 PM By: Duanne Guess MD FACS Entered By: Zenaida Deed on 08/31/2022 10:55:19 -------------------------------------------------------------------------------- Debridement Details Patient Name: Date of Service: Andrea Lain Yu. 08/31/2022 1:15 PM Medical Record Number: [id number] Patient Account Number: [id number] Date of Birth/Sex: Treating RN: 1940-07-04 (81 y.o. [name] Primary Care Provider: Jarome Matin Other Clinician: Referring Provider: Treating Provider/Extender: Marena Chancy in Treatment: 40 Debridement Performed for Assessment: Wound #4 Left,Lateral Ankle Performed By: Physician Duanne Guess, MD Debridement Type: Debridement Andrea Yu, Andrea Yu ([id number]) [id number] Page 4 of 15 Level of Consciousness (Pre-procedure): Awake and Alert Pre-procedure Verification/Time Out Yes - 13:45 Taken: Start Time: 13:50 Pain Control: Lidocaine 4% Topical Solution Percent of Wound Bed Debrided: 100% T Area Debrided (cm): otal 0.33 Tissue and other material debrided: Non-Viable, Eschar, Slough, Slough Level: Non-Viable Tissue Debridement Description: Selective/Open Wound Instrument: Curette Bleeding: None Procedural Pain: 0 Post Procedural Pain: 0 Response to Treatment: Procedure was tolerated well Level of Consciousness (Post- Awake and Alert procedure): Post Debridement Measurements of Total Wound Length: (cm) 0.7 Stage: Category/Stage IV Width: (cm) 0.6 Depth: (cm) 0.4 Volume: (cm)  0.132 Character of Wound/Ulcer Post Debridement: Stable Post Procedure Diagnosis Same as Pre-procedure Notes Scribed for Dr. Lady Gary by Zenaida Deed, RN Electronic Signature(s) Signed: 08/31/2022 3:51:43 PM By: Zenaida Deed RN, BSN Signed: 08/31/2022 4:07:09 PM By: Duanne Guess MD FACS Entered By: Zenaida Deed on 08/31/2022 10:57:08 -------------------------------------------------------------------------------- HPI Details Patient Name: Date of Service: Andrea Lain Yu. 08/31/2022 1:15 PM Medical Record Number: [id number] Patient Account Number: [id number] Date of Birth/Sex: Treating RN: July 08, 1940 (81 y.o. 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 She is not diabetic and quit smoking over 40 years ago. 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 She has developed Yu pressure ulcer on the right medial malleolus. In addition, she apparently  Eather Colas, BSN Previous Signature: 08/31/2022 4:07:09 PM Version By: Duanne Guess MD Karis Juba ([id number]) [id number] Page 3 of 15 Entered By: Zenaida Deed on 09/15/2022 08:57:08 -------------------------------------------------------------------------------- Debridement Details Patient Name: Date of Service: Andrea Yu 08/31/2022 1:15 PM Medical Record Number: [id number] Patient Account Number: [id number] Date of Birth/Sex: Treating RN: 1940/06/05 (82 y.o. [name] Primary Care Provider: Jarome Matin Other Clinician: Referring Provider: Treating Provider/Extender: Marena Chancy in Treatment: 40 Debridement Performed for Assessment: Wound #1 Right,Medial Malleolus Performed By: Physician Duanne Guess, MD Debridement Type: Debridement Level of Consciousness (Pre-procedure): Awake and Alert Pre-procedure Verification/Time Out Yes - 13:45 Taken: Start Time: 13:50 Pain Control: Lidocaine 4% Topical Solution Percent of Wound Bed Debrided: 100% T Area Debrided (cm): otal 0.55 Tissue and other material debrided: Non-Viable, Eschar, Slough, Slough Level: Non-Viable Tissue Debridement Description: Selective/Open Wound Instrument: Curette Bleeding: None Procedural Pain: 0 Post Procedural Pain: 0 Response to Treatment: Procedure was tolerated well Level of Consciousness (Post- Awake  and Alert procedure): Post Debridement Measurements of Total Wound Length: (cm) 1 Stage: Category/Stage III Width: (cm) 0.7 Depth: (cm) 0.1 Volume: (cm) 0.055 Character of Wound/Ulcer Post Debridement: Stable Post Procedure Diagnosis Same as Pre-procedure Notes Scribed for Dr. Lady Gary by Zenaida Deed, RN Electronic Signature(s) Signed: 08/31/2022 3:51:43 PM By: Zenaida Deed RN, BSN Signed: 08/31/2022 4:07:09 PM By: Duanne Guess MD FACS Entered By: Zenaida Deed on 08/31/2022 10:55:19 -------------------------------------------------------------------------------- Debridement Details Patient Name: Date of Service: Andrea Lain Yu. 08/31/2022 1:15 PM Medical Record Number: [id number] Patient Account Number: [id number] Date of Birth/Sex: Treating RN: 1940-07-04 (81 y.o. [name] Primary Care Provider: Jarome Matin Other Clinician: Referring Provider: Treating Provider/Extender: Marena Chancy in Treatment: 40 Debridement Performed for Assessment: Wound #4 Left,Lateral Ankle Performed By: Physician Duanne Guess, MD Debridement Type: Debridement Andrea Yu, Andrea Yu ([id number]) [id number].pdf Page 4 of 15 Level of Consciousness (Pre-procedure): Awake and Alert Pre-procedure Verification/Time Out Yes - 13:45 Taken: Start Time: 13:50 Pain Control: Lidocaine 4% Topical Solution Percent of Wound Bed Debrided: 100% T Area Debrided (cm): otal 0.33 Tissue and other material debrided: Non-Viable, Eschar, Slough, Slough Level: Non-Viable Tissue Debridement Description: Selective/Open Wound Instrument: Curette Bleeding: None Procedural Pain: 0 Post Procedural Pain: 0 Response to Treatment: Procedure was tolerated well Level of Consciousness (Post- Awake and Alert procedure): Post Debridement Measurements of Total Wound Length: (cm) 0.7 Stage: Category/Stage IV Width: (cm) 0.6 Depth: (cm) 0.4 Volume: (cm)  0.132 Character of Wound/Ulcer Post Debridement: Stable Post Procedure Diagnosis Same as Pre-procedure Notes Scribed for Dr. Lady Gary by Zenaida Deed, RN Electronic Signature(s) Signed: 08/31/2022 3:51:43 PM By: Zenaida Deed RN, BSN Signed: 08/31/2022 4:07:09 PM By: Duanne Guess MD FACS Entered By: Zenaida Deed on 08/31/2022 10:57:08 -------------------------------------------------------------------------------- HPI Details Patient Name: Date of Service: Andrea Lain Yu. 08/31/2022 1:15 PM Medical Record Number: [id number] Patient Account Number: [id number] Date of Birth/Sex: Treating RN: July 08, 1940 (81 y.o. 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 She is not diabetic and quit smoking over 40 years ago. 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 She has developed Yu pressure ulcer on the right medial malleolus. In addition, she apparently  Week/30 Days Discharge Instructions: Secure with Kerlix as directed. Secured With: 372M Medipore Scientist, research (life sciences) Surgical T 2x10 (in/yd) (Generic) 1 x Per Week/30 Days ape Discharge Instructions: Secure with tape as directed. Wound #2 - Gluteus Wound Laterality: Right Cleanser: Normal Saline (Generic) 1 x Per Day/30 Days Discharge Instructions: Cleanse the wound with Normal Saline prior to applying Yu clean dressing using gauze sponges, not tissue or cotton balls. Peri-Wound Care: Skin Prep (Generic) 1 x Per Day/30 Days Discharge Instructions: Use skin prep as directed Prim Dressing: Dakin's Solution 0.25%, 16 (oz) (Generic) 1 x Per Day/30 Days ary Discharge Instructions: Moisten gauze with Dakin's solution Prim Dressing: Promogran Prisma Matrix, 4.34 (sq in) (silver collagen) (DME) (Dispense As Written) 1 x Per Day/30 Days ary Discharge Instructions: Moisten collagen with saline or hydrogel Prim Dressing: cotton tipped  applicators (DME) (Generic) 1 x Per Day/30 Days ary Discharge Instructions: use to pack wound Secondary Dressing: Zetuvit Plus Silicone Border Dressing 5x5 (in/in) (DME) (Generic) 1 x Per Day/30 Days Discharge Instructions: Apply silicone border over primary dressing as directed. Secured With: 372M Medipore H Soft Cloth Surgical T ape, 4 x 10 (in/yd) (DME) (Generic) 1 x Per Day/30 Days Discharge Instructions: Secure with tape as directed. Wound #4 - Ankle Wound Laterality: Left, Lateral Cleanser: Normal Saline (Generic) 1 x Per Week/30 Days Discharge Instructions: Cleanse the wound with Normal Saline prior to applying Yu clean dressing using gauze sponges, not tissue or cotton balls. Cleanser: Byram Ancillary Kit - 15 Day Supply (Generic) 1 x Per Week/30 Days Discharge Instructions: Use supplies as instructed; Kit contains: (15) Saline Bullets; (15) 3x3 Gauze; 15 pr Gloves Peri-Wound Care: Skin Prep (Generic) 1 x Per Week/30 Days Discharge Instructions: Use skin prep as directed Prim Dressing: Cutimed Sorbact Swab 1 x Per Week/30 Days ary Discharge Instructions: Apply to wound bed as instructed Prim Dressing: Oasis tri-layer ary 1 x Per Week/30 Days Secondary Dressing: Woven Gauze Sponge, Non-Sterile 4x4 in 1 x Per Week/30 Days Discharge Instructions: Apply over primary dressing as directed. Secured With: [employer], 4.5x3.1 (in/yd) 1 x Per Week/30 Days Discharge Instructions: Secure with Kerlix as directed. Secured With: 372M Medipore Scientist, research (life sciences) Surgical T 2x10 (in/yd) (Generic) 1 x Per Week/30 Days ape Discharge Instructions: Secure with tape as directed. Electronic Signature(s) Signed: 08/31/2022 4:07:09 PM By: Duanne Guess MD FACS Entered By: Duanne Guess on 08/31/2022 11:25:59 Andrea Yu ([id number]) [id number] Page 8 of 15 -------------------------------------------------------------------------------- Problem List Details Patient Name:  Date of Service: Andrea Yu 08/31/2022 1:15 PM Medical Record Number: [id number] Patient Account Number: [id number] Date of Birth/Sex: Treating RN: [birth year] (82 y.o. 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 11/24/2021 No Yes Z79.52 Long term (current) use of systemic steroids 11/24/2021 No Yes I10 Essential (primary) hypertension 11/24/2021 No Yes Inactive Problems ICD-10 Code Description Active Date Inactive Date L97.812 Non-pressure chronic ulcer of other part of right lower leg with fat layer exposed 07/21/2022 07/21/2022 Resolved Problems ICD-10 Code Description Active Date Resolved Date L97.511 Non-pressure chronic ulcer of other part of right foot  Week/30 Days Discharge Instructions: Secure with Kerlix as directed. Secured With: 372M Medipore Scientist, research (life sciences) Surgical T 2x10 (in/yd) (Generic) 1 x Per Week/30 Days ape Discharge Instructions: Secure with tape as directed. Wound #2 - Gluteus Wound Laterality: Right Cleanser: Normal Saline (Generic) 1 x Per Day/30 Days Discharge Instructions: Cleanse the wound with Normal Saline prior to applying Yu clean dressing using gauze sponges, not tissue or cotton balls. Peri-Wound Care: Skin Prep (Generic) 1 x Per Day/30 Days Discharge Instructions: Use skin prep as directed Prim Dressing: Dakin's Solution 0.25%, 16 (oz) (Generic) 1 x Per Day/30 Days ary Discharge Instructions: Moisten gauze with Dakin's solution Prim Dressing: Promogran Prisma Matrix, 4.34 (sq in) (silver collagen) (DME) (Dispense As Written) 1 x Per Day/30 Days ary Discharge Instructions: Moisten collagen with saline or hydrogel Prim Dressing: cotton tipped  applicators (DME) (Generic) 1 x Per Day/30 Days ary Discharge Instructions: use to pack wound Secondary Dressing: Zetuvit Plus Silicone Border Dressing 5x5 (in/in) (DME) (Generic) 1 x Per Day/30 Days Discharge Instructions: Apply silicone border over primary dressing as directed. Secured With: 372M Medipore H Soft Cloth Surgical T ape, 4 x 10 (in/yd) (DME) (Generic) 1 x Per Day/30 Days Discharge Instructions: Secure with tape as directed. Wound #4 - Ankle Wound Laterality: Left, Lateral Cleanser: Normal Saline (Generic) 1 x Per Week/30 Days Discharge Instructions: Cleanse the wound with Normal Saline prior to applying Yu clean dressing using gauze sponges, not tissue or cotton balls. Cleanser: Byram Ancillary Kit - 15 Day Supply (Generic) 1 x Per Week/30 Days Discharge Instructions: Use supplies as instructed; Kit contains: (15) Saline Bullets; (15) 3x3 Gauze; 15 pr Gloves Peri-Wound Care: Skin Prep (Generic) 1 x Per Week/30 Days Discharge Instructions: Use skin prep as directed Prim Dressing: Cutimed Sorbact Swab 1 x Per Week/30 Days ary Discharge Instructions: Apply to wound bed as instructed Prim Dressing: Oasis tri-layer ary 1 x Per Week/30 Days Secondary Dressing: Woven Gauze Sponge, Non-Sterile 4x4 in 1 x Per Week/30 Days Discharge Instructions: Apply over primary dressing as directed. Secured With: [employer], 4.5x3.1 (in/yd) 1 x Per Week/30 Days Discharge Instructions: Secure with Kerlix as directed. Secured With: 372M Medipore Scientist, research (life sciences) Surgical T 2x10 (in/yd) (Generic) 1 x Per Week/30 Days ape Discharge Instructions: Secure with tape as directed. Electronic Signature(s) Signed: 08/31/2022 4:07:09 PM By: Duanne Guess MD FACS Entered By: Duanne Guess on 08/31/2022 11:25:59 Andrea Yu ([id number]) [id number] Page 8 of 15 -------------------------------------------------------------------------------- Problem List Details Patient Name:  Date of Service: Andrea Yu 08/31/2022 1:15 PM Medical Record Number: [id number] Patient Account Number: [id number] Date of Birth/Sex: Treating RN: [birth year] (82 y.o. 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 Respiratory Normal work of breathing on room air. Notes 08/31/2022: The cavity of the buttock ulcer continues to contract. It is about half Yu centimeter shallower than last week and there is no undermining. The cavity is also narrower and is more difficult to get an index finger into the wound. Both ankle wounds measured slightly smaller. Both have Yu rather dry skin edge but good granulation tissue present with minimal slough. Andrea Yu, Andrea Yu ([id number]) [id number] Page 6 of 15 Electronic Signature(s) Signed: 08/31/2022 2:25:41 PM By: Duanne Guess MD FACS Entered By: Duanne Guess on 08/31/2022 11:25:41 -------------------------------------------------------------------------------- Physician Orders Details Patient Name: Date of Service: Andrea Yu 08/31/2022 1:15 PM Medical Record Number: [id number] Patient Account Number: [id number] Date of Birth/Sex: Treating RN: 19-Jul-1940 (81 y.o. 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 - Dr. Lady Gary - Room 1 Return Yu Wed 8/14 @ 12:30 pm Anesthetic (In clinic) Topical Lidocaine 4% applied to wound bed Cellular or Tissue Based Products Wound #1 Right,Medial Malleolus Cellular or Tissue Based Product Type: - oasis tri-layer #7 Cellular or Tissue Based Product applied to wound bed, secured with steri-strips, cover with Adaptic or Mepitel. (DO NOT REMOVE). Wound #4 Left,Lateral Ankle Cellular or Tissue Based Product Type: - oasis tri-layer #7 Cellular or Tissue Based Product applied to wound bed, secured with steri-strips, cover with Adaptic or Mepitel. (DO NOT REMOVE). Bathing/ Shower/ Hygiene May shower with protection but do not get wound dressing(s) wet. Protect dressing(s) with water repellant cover (for example, large plastic bag) or Yu cast cover and may then take shower. Off-Loading Multipodus Splint to: - prevalon boot to both feet Turn and reposition every 2 hours Home Health No change in wound care orders this week; continue Home Health for wound care. May utilize formulary equivalent dressing for wound treatment orders unless otherwise specified. - may chage outer dressing only on ankles, DO NOT remove sterostrips Dressing changes to be completed by Home Health on Monday / Wednesday / Friday except when patient has scheduled visit at [facility]. Other Home Health Orders/Instructions: - [facility] Wound Treatment Wound #1 - Malleolus Wound  Laterality: Right, Medial Cleanser: Normal Saline (Generic) 1 x Per Week/30 Days Discharge Instructions: Cleanse the wound with Normal Saline prior to applying Yu clean dressing using gauze sponges, not tissue or cotton balls. Cleanser: [employer] Ancillary Kit - 15 Day Supply (Generic) 1 x Per Week/30 Days Discharge Instructions: Use supplies as instructed; Kit contains: (15) Saline Bullets; (15) 3x3 Gauze; 15 pr Gloves Peri-Wound Care: Skin Prep (Generic) 1 x Per Week/30 Days Discharge Instructions: Use skin prep as directed Andrea Yu, Andrea Yu ([id number]) [phone].pdf Page 7 of 15 Prim Dressing: Cutimed Sorbact Swab 1 x Per Week/30 Days ary Discharge Instructions: Apply to wound bed as instructed Prim Dressing: Oasis tri-layer ary 1 x Per Week/30 Days Secondary Dressing: Woven Gauze Sponge, Non-Sterile 4x4 in 1 x Per Week/30 Days Discharge Instructions: Apply over primary dressing as directed. Secured With: [employer], 4.5x3.1 (in/yd) 1 x Per  Andrea Yu, Andrea Yu ([id number]) [id number] Page 1 of 15 Visit Report for 08/31/2022 Chief Complaint Document Details Patient Name: Date of Service: Andrea Yu 08/31/2022 1:15 PM Medical Record Number: [id number] Patient Account Number: [id number] Date of Birth/Sex: Treating RN: 07-Sep-1940 (82 y.o. [name] Primary Care Provider: Jarome Matin Other Clinician: Referring Provider: Treating Provider/Extender: Marena Chancy in Treatment: 40 Information Obtained from: Patient Chief Complaint Patient is at the clinic for treatment of an open pressure ulcer on her right medial ankle, and Yu large abscess on her right buttock Electronic Signature(s) Signed: 08/31/2022 2:23:37 PM By: Duanne Guess MD FACS Entered By: Duanne Guess on 08/31/2022 11:23:37 -------------------------------------------------------------------------------- Cellular or Tissue Based Product Details Patient Name: Date of Service: Andrea Yu 08/31/2022 1:15 PM Medical Record Number: [id number] Patient Account Number: [id number] Date of Birth/Sex: Treating RN: 02-Jan-1941 (81 y.o. [name] Primary Care Provider: Jarome Matin Other Clinician: Referring Provider: Treating Provider/Extender: Marena Chancy in Treatment: 40 Cellular or Tissue Based Product Type Wound #4 Left,Lateral Ankle Applied to: Performed By: Physician Duanne Guess, MD Cellular or Tissue Based Product Type: Oasis tri-layer Level of Consciousness (Pre-procedure): Awake and Alert Pre-procedure Verification/Time Out Yes - 13:55 Taken: Location: trunk / arms / legs Wound Size (sq cm): 0.42 Product Size (sq cm): 5 Waste Size (sq cm): 3 Waste Reason: wound size Amount of Product Applied (sq cm): 2 Instrument Used: Forceps, Scissors Lot #: [id number] Order #: 7 Expiration Date: 05/01/2024 Fenestrated: No Reconstituted: Yes Solution  Type: saline Solution Amount: 5 ml Lot #: [id number] KS Solution Expiration Date: 02/16/2024 Secured: Yes Secured With: Steri-Strips Dressing Applied: Yes Primary Dressing: sorbact Procedural Pain: 0 Post Procedural Pain: 0 Response to Treatment: Procedure was tolerated well Andrea Yu, Andrea Yu ([id number]) [phone].pdf Page 2 of 15 Level of Consciousness (Post- Awake and Alert procedure): Post Procedure Diagnosis Same as Pre-procedure Notes Scribed for Dr. Lady Gary by Zenaida Deed, RN Electronic Signature(s) Signed: 08/31/2022 3:51:43 PM By: Zenaida Deed RN, BSN Signed: 08/31/2022 4:07:09 PM By: Duanne Guess MD FACS Entered By: Zenaida Deed on 08/31/2022 11:01:57 -------------------------------------------------------------------------------- Cellular or Tissue Based Product Details Patient Name: Date of Service: Andrea Yu 08/31/2022 1:15 PM Medical Record Number: [id number] Patient Account Number: [id number] Date of Birth/Sex: Treating RN: 11-Jun-1940 (81 y.o. [name] Primary Care Provider: Jarome Matin Other Clinician: Referring Provider: Treating Provider/Extender: Marena Chancy in Treatment: 40 Cellular or Tissue Based Product Type Wound #1 Right,Medial Malleolus Applied to: Performed By: Physician Duanne Guess, MD Cellular or Tissue Based Product Type: Oasis tri-layer Level of Consciousness (Pre-procedure): Awake and Alert Pre-procedure Verification/Time Out Yes - 13:55 Taken: Location: trunk / arms / legs Wound Size (sq cm): 0.7 Product Size (sq cm): 6 Waste Size (sq cm): 2 Waste Reason: wound size Amount of Product Applied (sq cm): 4 Instrument Used: Forceps, Scissors Lot #: [id number] Order #: 7 Expiration Date: 05/01/2024 Fenestrated: No Reconstituted: Yes Solution Type: saline Solution Amount: 5 ml Lot #: [id number] KS Solution Expiration Date: 02/16/2024 Secured: Yes Secured With:  Steri-Strips Dressing Applied: Yes Primary Dressing: sorbact Procedural Pain: 0 Post Procedural Pain: 0 Response to Treatment: Procedure was tolerated well Level of Consciousness (Post- Awake and Alert procedure): Post Procedure Diagnosis Same as Pre-procedure Notes Scribed for Dr. Lady Gary by Zenaida Deed, RN Electronic Signature(s) Signed: 09/16/2022 3:06:47 PM By: Duanne Guess MD FACS Signed: 10/11/2022 4:46:56 PM By: Zenaida Deed RN, BSN Previous Signature: 08/31/2022 3:51:43 PM Version By:

## 2022-09-08 ENCOUNTER — Encounter (HOSPITAL_BASED_OUTPATIENT_CLINIC_OR_DEPARTMENT_OTHER): Payer: Medicare Other | Admitting: General Surgery

## 2022-09-08 DIAGNOSIS — L89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, stage 3: Secondary | ICD-10-CM | POA: Diagnosis not present

## 2022-09-08 NOTE — Progress Notes (Addendum)
Electronic Signature(s) Signed: 09/16/2022 3:06:47 PM By: Duanne Guess MD FACS Signed: 10/11/2022 4:46:56 PM By: Zenaida Deed RN, BSN Previous Signature: 09/08/2022 2:35:40 PM Version By: Tommie Ard RN Previous Signature: 09/08/2022 2:57:57 PM Version By: Duanne Guess MD FACS [name] ([id number]) [id number] Page 3 of 15 Previous Signature: 09/08/2022 2:57:57 PM Version By: Duanne Guess MD FACS Previous Signature: 09/08/2022 2:33:10 PM Version By: Duanne Guess MD FACS Entered By: Zenaida Deed on 09/15/2022 09:00:58 -------------------------------------------------------------------------------- Debridement Details Patient Name: Date of Service: Andrea Lain A. 09/08/2022 12:30 PM Medical Record Number: [id number] Patient Account Number: [id number] Date of Birth/Sex: Treating RN: 1940-09-24 (82 y.o. Andrea Yu Primary Care Provider: Jarome Matin Other Clinician: Referring Provider: Treating Provider/Extender: Marena Chancy in Treatment: 41 Debridement Performed for Assessment: Wound #4 Left,Lateral Ankle Performed By: Physician Duanne Guess, MD Debridement Type: Debridement Level of Consciousness (Pre-procedure): Awake and Alert Pre-procedure Verification/Time Out Yes - 14:20 Taken: Start Time: 14:21 Pain Control: Lidocaine 5% topical ointment Percent of Wound Bed Debrided: 100% T  Area Debrided (cm): otal 0.78 Tissue and other material debrided: Non-Viable, Slough, [employer], Other: dry skin Level: Non-Viable Tissue Debridement Description: Selective/Open Wound Instrument: Curette Bleeding: Minimum Hemostasis Achieved: Pressure Response to Treatment: Procedure was tolerated well Level of Consciousness (Post- Awake and Alert procedure): Post Debridement Measurements of Total Wound Length: (cm) 1 Stage: Category/Stage IV Width: (cm) 1 Depth: (cm) 0.4 Volume: (cm) 0.314 Character of Wound/Ulcer Post Debridement: Requires Further Debridement Post Procedure Diagnosis Same as Pre-procedure Notes Scribed for Dr Lady Gary by Tommie Ard, RN. Electronic Signature(s) Signed: 09/08/2022 2:33:10 PM By: Duanne Guess MD FACS Signed: 09/08/2022 2:45:07 PM By: Tommie Ard RN Entered By: Tommie Ard on 09/08/2022 10:23:27 -------------------------------------------------------------------------------- Debridement Details Patient Name: Date of Service: Andrea Yu 09/08/2022 12:30 PM Medical Record Number: [id number] Patient Account Number: [id number] Date of Birth/Sex: Treating RN: [birth year] (82 y.o. Andrea Yu Primary Care Provider: Jarome Matin Other Clinician: Referring Provider: Treating Provider/Extender: Marena Chancy in Treatment: 62 Debridement Performed for Assessment: Wound #1 Right,Medial Malleolus Performed By: Physician Duanne Guess, MD [name] ([id number]) [id number] Page 4 of 15 Debridement Type: Debridement Level of Consciousness (Pre-procedure): Awake and Alert Pre-procedure Verification/Time Out Yes - 14:20 Taken: Start Time: 14:21 Pain Control: Lidocaine 5% topical ointment Percent of Wound Bed Debrided: 100% T Area Debrided (cm): otal 0.49 Tissue and other material debrided: Non-Viable, Eschar, Slough, [employer], Other: dry skin Level: Non-Viable  Tissue Debridement Description: Selective/Open Wound Instrument: Curette Bleeding: Minimum Hemostasis Achieved: Pressure Response to Treatment: Procedure was tolerated well Level of Consciousness (Post- Awake and Alert procedure): Post Debridement Measurements of Total Wound Length: (cm) 0.7 Stage: Category/Stage III Width: (cm) 0.9 Depth: (cm) 0.1 Volume: (cm) 0.049 Character of Wound/Ulcer Post Debridement: Requires Further Debridement Post Procedure Diagnosis Same as Pre-procedure Notes Scribed for Dr Lady Gary by Tommie Ard, RN. Electronic Signature(s) Signed: 09/08/2022 2:33:10 PM By: Duanne Guess MD FACS Signed: 09/08/2022 2:45:07 PM By: Tommie Ard RN Entered By: Tommie Ard on 09/08/2022 10:24:45 -------------------------------------------------------------------------------- HPI Details Patient Name: Date of Service: Andrea Stacks Y A. 09/08/2022 12:30 PM Medical Record Number: [id number] Patient Account Number: [id number] Date of Birth/Sex: Treating RN: January 12, 1941 (82 y.o. 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 She is not diabetic and  on the left lateral ankle wound. Electronic Signature(s) Signed: 09/08/2022 1:37:18 PM By: Duanne Guess MD FACS Entered By: Duanne Guess on 09/08/2022 10:37:18 -------------------------------------------------------------------------------- Physical Exam Details Patient Name: Date of Service: Andrea Yu 09/08/2022 12:30 PM Medical Record Number: [id number] Patient Account Number: [id number] Date of Birth/Sex: Treating RN: 09-15-1940 (82 y.o. F) Primary Care Provider: Jarome Matin Other Clinician: Referring Provider: Treating Provider/Extender: Marena Chancy in Treatment: 41 Constitutional . . . . no acute distress. Respiratory Normal work of breathing on room air. Notes MELISSAANN, AXLINE ([id number]) [id number] Page 6 of 15 09/08/2022: The buttock ulcer cavity is smaller again today. It is shallower and narrower. Both ankle wounds were a little bit smaller, as well. There is a fair amount of slough on the left lateral ankle wound. Electronic Signature(s) Signed: 09/08/2022 1:37:51 PM By: Duanne Guess MD FACS Entered By: Duanne Guess on 09/08/2022  10:37:51 -------------------------------------------------------------------------------- Physician Orders Details Patient Name: Date of Service: Andrea Stacks Y A. 09/08/2022 12:30 PM Medical Record Number: [id number] Patient Account Number: [id number] Date of Birth/Sex: Treating RN: 09/27/1940 (82 y.o. 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 - Dr. Lady Gary - Room 1 Return A Anesthetic (In clinic) Topical Lidocaine 4% applied to wound bed Cellular or Tissue Based Products Wound #1 Right,Medial Malleolus Cellular or Tissue Based Product Type: - oasis tri-layer #8 Cellular or Tissue Based Product applied to wound bed, secured with steri-strips, cover with Adaptic or Mepitel. (DO NOT REMOVE). Wound #4 Left,Lateral Ankle Cellular or Tissue Based Product Type: - oasis tri-layer #8 Cellular or Tissue Based Product applied to wound bed, secured with steri-strips, cover with Adaptic or Mepitel. (DO NOT REMOVE). Bathing/ Shower/ Hygiene May shower with protection but do not get wound dressing(s) wet. Protect dressing(s) with water repellant cover (for example, large plastic bag) or a cast cover and may then take shower. Off-Loading Multipodus Splint to: - prevalon boot to both feet Turn and reposition every 2 hours Home Health No change in wound care orders this week;  continue Home Health for wound care. May utilize formulary equivalent dressing for wound treatment orders unless otherwise specified. - may chage outer dressing only on ankles, DO NOT remove sterostrips Dressing changes to be completed by Home Health on Monday / Wednesday / Friday except when patient has scheduled visit at [facility]. Other Home Health Orders/Instructions: - Medihome Wound Treatment Wound #1 - Malleolus Wound Laterality: Right, Medial Cleanser: Normal Saline (Generic) 1 x Per Week/30 Days Discharge Instructions: Cleanse the wound with Normal Saline prior to applying a clean dressing using gauze sponges, not tissue or cotton balls. Cleanser: Byram Ancillary Kit - 15 Day Supply (Generic) 1 x Per Week/30 Days Discharge Instructions: Use supplies as instructed; Kit contains: (15) Saline Bullets; (15) 3x3 Gauze; 15 pr Gloves Peri-Wound Care: Skin Prep (Generic) 1 x Per Week/30 Days MARQITA, BECERRIL A ([id number]) [id number] Page 7 of 15 Discharge Instructions: Use skin prep as directed Prim Dressing: Cutimed Sorbact Swab 1 x Per Week/30  Electronic Signature(s) Signed: 09/16/2022 3:06:47 PM By: Duanne Guess MD FACS Signed: 10/11/2022 4:46:56 PM By: Zenaida Deed RN, BSN Previous Signature: 09/08/2022 2:35:40 PM Version By: Tommie Ard RN Previous Signature: 09/08/2022 2:57:57 PM Version By: Duanne Guess MD FACS [name] ([id number]) [id number] Page 3 of 15 Previous Signature: 09/08/2022 2:57:57 PM Version By: Duanne Guess MD FACS Previous Signature: 09/08/2022 2:33:10 PM Version By: Duanne Guess MD FACS Entered By: Zenaida Deed on 09/15/2022 09:00:58 -------------------------------------------------------------------------------- Debridement Details Patient Name: Date of Service: Andrea Lain A. 09/08/2022 12:30 PM Medical Record Number: [id number] Patient Account Number: [id number] Date of Birth/Sex: Treating RN: 1940-09-24 (82 y.o. Andrea Yu Primary Care Provider: Jarome Matin Other Clinician: Referring Provider: Treating Provider/Extender: Marena Chancy in Treatment: 41 Debridement Performed for Assessment: Wound #4 Left,Lateral Ankle Performed By: Physician Duanne Guess, MD Debridement Type: Debridement Level of Consciousness (Pre-procedure): Awake and Alert Pre-procedure Verification/Time Out Yes - 14:20 Taken: Start Time: 14:21 Pain Control: Lidocaine 5% topical ointment Percent of Wound Bed Debrided: 100% T  Area Debrided (cm): otal 0.78 Tissue and other material debrided: Non-Viable, Slough, [employer], Other: dry skin Level: Non-Viable Tissue Debridement Description: Selective/Open Wound Instrument: Curette Bleeding: Minimum Hemostasis Achieved: Pressure Response to Treatment: Procedure was tolerated well Level of Consciousness (Post- Awake and Alert procedure): Post Debridement Measurements of Total Wound Length: (cm) 1 Stage: Category/Stage IV Width: (cm) 1 Depth: (cm) 0.4 Volume: (cm) 0.314 Character of Wound/Ulcer Post Debridement: Requires Further Debridement Post Procedure Diagnosis Same as Pre-procedure Notes Scribed for Dr Lady Gary by Tommie Ard, RN. Electronic Signature(s) Signed: 09/08/2022 2:33:10 PM By: Duanne Guess MD FACS Signed: 09/08/2022 2:45:07 PM By: Tommie Ard RN Entered By: Tommie Ard on 09/08/2022 10:23:27 -------------------------------------------------------------------------------- Debridement Details Patient Name: Date of Service: Andrea Yu 09/08/2022 12:30 PM Medical Record Number: [id number] Patient Account Number: [id number] Date of Birth/Sex: Treating RN: [birth year] (82 y.o. Andrea Yu Primary Care Provider: Jarome Matin Other Clinician: Referring Provider: Treating Provider/Extender: Marena Chancy in Treatment: 62 Debridement Performed for Assessment: Wound #1 Right,Medial Malleolus Performed By: Physician Duanne Guess, MD [name] ([id number]) [id number] Page 4 of 15 Debridement Type: Debridement Level of Consciousness (Pre-procedure): Awake and Alert Pre-procedure Verification/Time Out Yes - 14:20 Taken: Start Time: 14:21 Pain Control: Lidocaine 5% topical ointment Percent of Wound Bed Debrided: 100% T Area Debrided (cm): otal 0.49 Tissue and other material debrided: Non-Viable, Eschar, Slough, [employer], Other: dry skin Level: Non-Viable  Tissue Debridement Description: Selective/Open Wound Instrument: Curette Bleeding: Minimum Hemostasis Achieved: Pressure Response to Treatment: Procedure was tolerated well Level of Consciousness (Post- Awake and Alert procedure): Post Debridement Measurements of Total Wound Length: (cm) 0.7 Stage: Category/Stage III Width: (cm) 0.9 Depth: (cm) 0.1 Volume: (cm) 0.049 Character of Wound/Ulcer Post Debridement: Requires Further Debridement Post Procedure Diagnosis Same as Pre-procedure Notes Scribed for Dr Lady Gary by Tommie Ard, RN. Electronic Signature(s) Signed: 09/08/2022 2:33:10 PM By: Duanne Guess MD FACS Signed: 09/08/2022 2:45:07 PM By: Tommie Ard RN Entered By: Tommie Ard on 09/08/2022 10:24:45 -------------------------------------------------------------------------------- HPI Details Patient Name: Date of Service: Andrea Stacks Y A. 09/08/2022 12:30 PM Medical Record Number: [id number] Patient Account Number: [id number] Date of Birth/Sex: Treating RN: January 12, 1941 (82 y.o. 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 She is not diabetic and  on the left lateral ankle wound. Electronic Signature(s) Signed: 09/08/2022 1:37:18 PM By: Duanne Guess MD FACS Entered By: Duanne Guess on 09/08/2022 10:37:18 -------------------------------------------------------------------------------- Physical Exam Details Patient Name: Date of Service: Andrea Yu 09/08/2022 12:30 PM Medical Record Number: [id number] Patient Account Number: [id number] Date of Birth/Sex: Treating RN: 09-15-1940 (82 y.o. F) Primary Care Provider: Jarome Matin Other Clinician: Referring Provider: Treating Provider/Extender: Marena Chancy in Treatment: 41 Constitutional . . . . no acute distress. Respiratory Normal work of breathing on room air. Notes MELISSAANN, AXLINE ([id number]) [id number] Page 6 of 15 09/08/2022: The buttock ulcer cavity is smaller again today. It is shallower and narrower. Both ankle wounds were a little bit smaller, as well. There is a fair amount of slough on the left lateral ankle wound. Electronic Signature(s) Signed: 09/08/2022 1:37:51 PM By: Duanne Guess MD FACS Entered By: Duanne Guess on 09/08/2022  10:37:51 -------------------------------------------------------------------------------- Physician Orders Details Patient Name: Date of Service: Andrea Stacks Y A. 09/08/2022 12:30 PM Medical Record Number: [id number] Patient Account Number: [id number] Date of Birth/Sex: Treating RN: 09/27/1940 (82 y.o. 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 - Dr. Lady Gary - Room 1 Return A Anesthetic (In clinic) Topical Lidocaine 4% applied to wound bed Cellular or Tissue Based Products Wound #1 Right,Medial Malleolus Cellular or Tissue Based Product Type: - oasis tri-layer #8 Cellular or Tissue Based Product applied to wound bed, secured with steri-strips, cover with Adaptic or Mepitel. (DO NOT REMOVE). Wound #4 Left,Lateral Ankle Cellular or Tissue Based Product Type: - oasis tri-layer #8 Cellular or Tissue Based Product applied to wound bed, secured with steri-strips, cover with Adaptic or Mepitel. (DO NOT REMOVE). Bathing/ Shower/ Hygiene May shower with protection but do not get wound dressing(s) wet. Protect dressing(s) with water repellant cover (for example, large plastic bag) or a cast cover and may then take shower. Off-Loading Multipodus Splint to: - prevalon boot to both feet Turn and reposition every 2 hours Home Health No change in wound care orders this week;  continue Home Health for wound care. May utilize formulary equivalent dressing for wound treatment orders unless otherwise specified. - may chage outer dressing only on ankles, DO NOT remove sterostrips Dressing changes to be completed by Home Health on Monday / Wednesday / Friday except when patient has scheduled visit at [facility]. Other Home Health Orders/Instructions: - Medihome Wound Treatment Wound #1 - Malleolus Wound Laterality: Right, Medial Cleanser: Normal Saline (Generic) 1 x Per Week/30 Days Discharge Instructions: Cleanse the wound with Normal Saline prior to applying a clean dressing using gauze sponges, not tissue or cotton balls. Cleanser: Byram Ancillary Kit - 15 Day Supply (Generic) 1 x Per Week/30 Days Discharge Instructions: Use supplies as instructed; Kit contains: (15) Saline Bullets; (15) 3x3 Gauze; 15 pr Gloves Peri-Wound Care: Skin Prep (Generic) 1 x Per Week/30 Days MARQITA, BECERRIL A ([id number]) [id number] Page 7 of 15 Discharge Instructions: Use skin prep as directed Prim Dressing: Cutimed Sorbact Swab 1 x Per Week/30  Days ary Discharge Instructions: Apply to wound bed as instructed Prim Dressing: Oasis tri-layer ary 1 x Per Week/30 Days Secondary Dressing: Woven Gauze Sponge, Non-Sterile 4x4 in 1 x Per Week/30 Days Discharge Instructions: Apply over primary dressing as directed. Secured With: [employer], 4.5x3.1 (in/yd) 1 x Per Week/30 Days Discharge Instructions: Secure with Kerlix as directed. Secured With: 70M Medipore Scientist, research (life sciences) Surgical T 2x10 (in/yd) (Generic) 1 x Per Week/30 Days ape Discharge Instructions: Secure with tape as directed. Wound #2 - Gluteus Wound Laterality: Right Cleanser: Normal Saline (Generic) 1 x Per Day/30 Days Discharge Instructions: Cleanse the wound with Normal Saline prior to applying a clean dressing using gauze sponges, not tissue or cotton balls. Peri-Wound Care: Skin Prep (Generic)  1 x Per Day/30 Days Discharge Instructions: Use skin prep as directed Prim Dressing: Dakin's Solution 0.25%, 16 (oz) (Generic) 1 x Per Day/30 Days ary Discharge Instructions: Moisten gauze with Dakin's solution Prim Dressing: Promogran Prisma Matrix, 4.34 (sq in) (silver collagen) (Dispense As Written) 1 x Per Day/30 Days ary Discharge Instructions: Moisten collagen with saline or hydrogel Prim Dressing: cotton tipped applicators (Generic) 1 x Per Day/30 Days ary Discharge Instructions: use to pack wound Secondary Dressing: Zetuvit Plus Silicone Border Dressing 5x5 (in/in) (Generic) 1 x Per Day/30 Days Discharge Instructions: Apply silicone border over primary dressing as directed. Secured With: 70M Medipore H Soft Cloth Surgical T ape, 4 x 10 (in/yd) (Generic) 1 x Per Day/30 Days Discharge Instructions: Secure with tape as directed. Wound #4 - Ankle Wound Laterality: Left, Lateral Cleanser: Normal Saline (Generic) 1 x Per Week/30 Days Discharge Instructions: Cleanse the wound with Normal Saline prior to applying a clean dressing using gauze sponges, not tissue or cotton balls. Cleanser: Byram Ancillary Kit - 15 Day Supply (Generic) 1 x Per Week/30 Days Discharge Instructions: Use supplies as instructed; Kit contains: (15) Saline Bullets; (15) 3x3 Gauze; 15 pr Gloves Peri-Wound Care: Skin Prep (Generic) 1 x Per Week/30 Days Discharge Instructions: Use skin prep as directed Prim Dressing: Cutimed Sorbact Swab 1 x Per Week/30 Days ary Discharge Instructions: Apply to wound bed as instructed Prim Dressing: Oasis tri-layer ary 1 x Per Week/30 Days Secondary Dressing: Woven Gauze Sponge, Non-Sterile 4x4 in 1 x Per Week/30 Days Discharge Instructions: Apply over primary dressing as directed. Secured With: [employer], 4.5x3.1 (in/yd) 1 x Per Week/30 Days Discharge Instructions: Secure with Kerlix as directed. Secured With: 70M Medipore Scientist, research (life sciences) Surgical T 2x10 (in/yd) (Generic) 1 x  Per Week/30 Days ape Discharge Instructions: Secure with tape as directed. Electronic Signature(s) Signed: 09/08/2022 2:33:10 PM By: Duanne Guess MD FACS Signed: 09/08/2022 2:45:07 PM By: Tommie Ard RN Entered By: Tommie Ard on 09/08/2022 10:43:32 [name] ([id number]) [id number] Page 8 of 15 -------------------------------------------------------------------------------- Problem List Details Patient Name: Date of Service: Andrea Yu 09/08/2022 12:30 PM Medical Record Number: [id number] Patient Account Number: [id number] Date of Birth/Sex: Treating RN: 10/19/1940 (82 y.o. 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 Oasis Tri layer was then moistened with saline and applied to each wound. I used some drawtex to hold the skin substitute down in the left ankle wound. This was not required for the right ankle wound, which is flush with the surrounding surface. Everything was secured in place with Sorbact and Steri-Strips. She will follow-up in 1 week. Electronic Signature(s) Signed: 09/16/2022 3:06:47 PM By: Duanne Guess MD FACS Signed: 10/11/2022 4:46:56 PM By: Zenaida Deed RN, BSN Previous Signature: 09/12/2022 10:48:24 AM Version By: Shawn Stall RN, BSN Previous Signature: 09/13/2022 8:35:27 AM Version By: Duanne Guess MD FACS Previous Signature: 09/08/2022 1:39:24 PM Version By: Duanne Guess MD FACS [name]  ([id number]) [id number] Page 13 of 15 Entered By: Zenaida Deed on 09/15/2022 09:02:07 -------------------------------------------------------------------------------- HxROS Details Patient Name: Date of Service: Andrea Yu 09/08/2022 12:30 PM Medical Record Number: [id number] Patient Account Number: [id number] Date of Birth/Sex: Treating RN: 1940/08/11 (82 y.o. F) Primary Care Provider: Jarome Matin Other Clinician: Referring Provider: Treating Provider/Extender: Marena Chancy in Treatment: 41 Information Obtained From Patient Caregiver Chart Constitutional Symptoms (General Health) Medical History: Past Medical History Notes: morbid obesity Eyes Medical History: Positive for: Cataracts - bil extracted Negative for: Glaucoma; Optic Neuritis Ear/Nose/Mouth/Throat Medical History: Past Medical History Notes: hard of hearing Cardiovascular Medical History: Positive for: Arrhythmia - afib; Hypertension Gastrointestinal Medical History: Past Medical History Notes: GERD Endocrine Medical History: Negative for: Type I Diabetes; Type II Diabetes Past Medical History Notes: adrenal insufficiency Genitourinary Medical History: Negative for: End Stage Renal Disease Integumentary (Skin) Medical History: Negative for: History of Burn Musculoskeletal Medical History: Positive for: Osteoarthritis; Osteomyelitis - pelvic Past Medical History Notes: psoriatic arthritis, thoracic discitis, displaced spiral fx right tibia Neurologic Medical History: Positive for: Neuropathy Past Medical History NotesTREVOR, KEHN ([id number]) [id number] Page 14 of 15 hx [facility] Oncologic Medical History: Negative for: Received Chemotherapy; Received Radiation Psychiatric Medical History: Positive for: Confinement Anxiety Negative for: Anorexia/bulimia HBO Extended History  Items Eyes: Cataracts Immunizations Pneumococcal Vaccine: Received Pneumococcal Vaccination: Yes Received Pneumococcal Vaccination On or After 58th Birthday: Yes Implantable Devices None Hospitalization / Surgery History Type of Hospitalization/Surgery TEE right ankle fusion bil knee replacements bil cataract extractions posterior lumbar fusion vaginal hysterectomy cholecystectomy Family and Social History Cancer: Yes - Father,Maternal Grandparents,Siblings,Child; Diabetes: No; Heart Disease: Yes - Father; Hereditary Spherocytosis: No; Hypertension: Yes - Father; Kidney Disease: No; Lung Disease: No; Seizures: No; Stroke: No; Thyroid Problems: Yes - Mother; Tuberculosis: No; Former smoker - quit 40 yr ago; Marital Status - Widowed; Alcohol Use: Never; Drug Use: No History; Caffeine Use: Daily - tea, soda; Financial Concerns: No; Food, Clothing or Shelter Needs: No; Support System Lacking: No; Transportation Concerns: No Psychologist, prison and probation services) Signed: 09/08/2022 2:33:10 PM By: Duanne Guess MD FACS Entered By: Duanne Guess on 09/08/2022 10:37:26 -------------------------------------------------------------------------------- SuperBill Details Patient Name: Date of Service: Andrea Yu 09/08/2022 Medical Record Number: [id number] Patient Account Number: [id number] Date of Birth/Sex: Treating RN: 11-Sep-1940 (82 y.o. 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 Electronic Signature(s) Signed: 09/08/2022 1:37:18 PM By: Duanne Guess MD FACS Entered By: Duanne Guess on 09/08/2022 10:37:18 -------------------------------------------------------------------------------- Physical Exam Details Patient Name: Date of Service: Andrea Yu 09/08/2022 12:30 PM Medical Record Number: [id number] Patient Account Number: [id number] Date of Birth/Sex: Treating RN: 09-15-1940 (82 y.o. F) Primary Care Provider: Jarome Matin Other Clinician: Referring Provider: Treating Provider/Extender: Marena Chancy in Treatment: 41 Constitutional . . . . no acute distress. Respiratory Normal work of breathing on room air. Notes MELISSAANN, AXLINE ([id number]) [id number] Page 6 of 15 09/08/2022: The buttock ulcer cavity is smaller again today. It is shallower and narrower. Both ankle wounds were a little bit smaller, as well. There is a fair amount of slough on the left lateral ankle wound. Electronic Signature(s) Signed: 09/08/2022 1:37:51 PM By: Duanne Guess MD FACS Entered By: Duanne Guess on 09/08/2022  10:37:51 -------------------------------------------------------------------------------- Physician Orders Details Patient Name: Date of Service: Andrea Stacks Y A. 09/08/2022 12:30 PM Medical Record Number: [id number] Patient Account Number: [id number] Date of Birth/Sex: Treating RN: 09/27/1940 (82 y.o. 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 - Dr. Lady Gary - Room 1 Return A Anesthetic (In clinic) Topical Lidocaine 4% applied to wound bed Cellular or Tissue Based Products Wound #1 Right,Medial Malleolus Cellular or Tissue Based Product Type: - oasis tri-layer #8 Cellular or Tissue Based Product applied to wound bed, secured with steri-strips, cover with Adaptic or Mepitel. (DO NOT REMOVE). Wound #4 Left,Lateral Ankle Cellular or Tissue Based Product Type: - oasis tri-layer #8 Cellular or Tissue Based Product applied to wound bed, secured with steri-strips, cover with Adaptic or Mepitel. (DO NOT REMOVE). Bathing/ Shower/ Hygiene May shower with protection but do not get wound dressing(s) wet. Protect dressing(s) with water repellant cover (for example, large plastic bag) or a cast cover and may then take shower. Off-Loading Multipodus Splint to: - prevalon boot to both feet Turn and reposition every 2 hours Home Health No change in wound care orders this week;  continue Home Health for wound care. May utilize formulary equivalent dressing for wound treatment orders unless otherwise specified. - may chage outer dressing only on ankles, DO NOT remove sterostrips Dressing changes to be completed by Home Health on Monday / Wednesday / Friday except when patient has scheduled visit at [facility]. Other Home Health Orders/Instructions: - Med[facility] Wound Treatment Wound #1 - Malleolus Wound Laterality: Right, Medial Cleanser: Normal Saline (Generic) 1 x Per Week/30 Days Discharge Instructions: Cleanse the wound with Normal Saline prior to applying a clean dressing using gauze sponges, not tissue or cotton balls. Cleanser: Byram Ancillary Kit - 15 Day Supply (Generic) 1 x Per Week/30 Days Discharge Instructions: Use supplies as instructed; Kit contains: (15) Saline Bullets; (15) 3x3 Gauze; 15 pr Gloves Peri-Wound Care: Skin Prep (Generic) 1 x Per Week/30 Days MARQITA, BECERRIL A ([id number]) [id number] Page 7 of 15 Discharge Instructions: Use skin prep as directed Prim Dressing: Cutimed Sorbact Swab 1 x Per Week/30  Days ary Discharge Instructions: Apply to wound bed as instructed Prim Dressing: Oasis tri-layer ary 1 x Per Week/30 Days Secondary Dressing: Woven Gauze Sponge, Non-Sterile 4x4 in 1 x Per Week/30 Days Discharge Instructions: Apply over primary dressing as directed. Secured With: [employer], 4.5x3.1 (in/yd) 1 x Per Week/30 Days Discharge Instructions: Secure with Kerlix as directed. Secured With: 70M Medipore Scientist, research (life sciences) Surgical T 2x10 (in/yd) (Generic) 1 x Per Week/30 Days ape Discharge Instructions: Secure with tape as directed. Wound #2 - Gluteus Wound Laterality: Right Cleanser: Normal Saline (Generic) 1 x Per Day/30 Days Discharge Instructions: Cleanse the wound with Normal Saline prior to applying a clean dressing using gauze sponges, not tissue or cotton balls. Peri-Wound Care: Skin Prep (Generic)  1 x Per Day/30 Days Discharge Instructions: Use skin prep as directed Prim Dressing: Dakin's Solution 0.25%, 16 (oz) (Generic) 1 x Per Day/30 Days ary Discharge Instructions: Moisten gauze with Dakin's solution Prim Dressing: Promogran Prisma Matrix, 4.34 (sq in) (silver collagen) (Dispense As Written) 1 x Per Day/30 Days ary Discharge Instructions: Moisten collagen with saline or hydrogel Prim Dressing: cotton tipped applicators (Generic) 1 x Per Day/30 Days ary Discharge Instructions: use to pack wound Secondary Dressing: Zetuvit Plus Silicone Border Dressing 5x5 (in/in) (Generic) 1 x Per Day/30 Days Discharge Instructions: Apply silicone border over primary dressing as directed. Secured With: 70M Medipore H Soft Cloth Surgical T ape, 4 x 10 (in/yd) (Generic) 1 x Per Day/30 Days Discharge Instructions: Secure with tape as directed. Wound #4 - Ankle Wound Laterality: Left, Lateral Cleanser: Normal Saline (Generic) 1 x Per Week/30 Days Discharge Instructions: Cleanse the wound with Normal Saline prior to applying a clean dressing using gauze sponges, not tissue or cotton balls. Cleanser: Byram Ancillary Kit - 15 Day Supply (Generic) 1 x Per Week/30 Days Discharge Instructions: Use supplies as instructed; Kit contains: (15) Saline Bullets; (15) 3x3 Gauze; 15 pr Gloves Peri-Wound Care: Skin Prep (Generic) 1 x Per Week/30 Days Discharge Instructions: Use skin prep as directed Prim Dressing: Cutimed Sorbact Swab 1 x Per Week/30 Days ary Discharge Instructions: Apply to wound bed as instructed Prim Dressing: Oasis tri-layer ary 1 x Per Week/30 Days Secondary Dressing: Woven Gauze Sponge, Non-Sterile 4x4 in 1 x Per Week/30 Days Discharge Instructions: Apply over primary dressing as directed. Secured With: [employer], 4.5x3.1 (in/yd) 1 x Per Week/30 Days Discharge Instructions: Secure with Kerlix as directed. Secured With: 70M Medipore Scientist, research (life sciences) Surgical T 2x10 (in/yd) (Generic) 1 x  Per Week/30 Days ape Discharge Instructions: Secure with tape as directed. Electronic Signature(s) Signed: 09/08/2022 2:33:10 PM By: Duanne Guess MD FACS Signed: 09/08/2022 2:45:07 PM By: Tommie Ard RN Entered By: Tommie Ard on 09/08/2022 10:43:32 [name] ([id number]) [id number] Page 8 of 15 -------------------------------------------------------------------------------- Problem List Details Patient Name: Date of Service: Andrea Yu 09/08/2022 12:30 PM Medical Record Number: [id number] Patient Account Number: [id number] Date of Birth/Sex: Treating RN: 10/19/1940 (82 y.o. 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 Electronic Signature(s) Signed: 09/08/2022 1:37:18 PM By: Duanne Guess MD FACS Entered By: Duanne Guess on 09/08/2022 10:37:18 -------------------------------------------------------------------------------- Physical Exam Details Patient Name: Date of Service: Andrea Yu 09/08/2022 12:30 PM Medical Record Number: [id number] Patient Account Number: [id number] Date of Birth/Sex: Treating RN: 09-15-1940 (82 y.o. F) Primary Care Provider: Jarome Matin Other Clinician: Referring Provider: Treating Provider/Extender: Marena Chancy in Treatment: 41 Constitutional . . . . no acute distress. Respiratory Normal work of breathing on room air. Notes MELISSAANN, AXLINE ([id number]) [id number] Page 6 of 15 09/08/2022: The buttock ulcer cavity is smaller again today. It is shallower and narrower. Both ankle wounds were a little bit smaller, as well. There is a fair amount of slough on the left lateral ankle wound. Electronic Signature(s) Signed: 09/08/2022 1:37:51 PM By: Duanne Guess MD FACS Entered By: Duanne Guess on 09/08/2022  10:37:51 -------------------------------------------------------------------------------- Physician Orders Details Patient Name: Date of Service: Andrea Stacks Y A. 09/08/2022 12:30 PM Medical Record Number: [id number] Patient Account Number: [id number] Date of Birth/Sex: Treating RN: 09/27/1940 (82 y.o. 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 - Dr. Lady Gary - Room 1 Return A Anesthetic (In clinic) Topical Lidocaine 4% applied to wound bed Cellular or Tissue Based Products Wound #1 Right,Medial Malleolus Cellular or Tissue Based Product Type: - oasis tri-layer #8 Cellular or Tissue Based Product applied to wound bed, secured with steri-strips, cover with Adaptic or Mepitel. (DO NOT REMOVE). Wound #4 Left,Lateral Ankle Cellular or Tissue Based Product Type: - oasis tri-layer #8 Cellular or Tissue Based Product applied to wound bed, secured with steri-strips, cover with Adaptic or Mepitel. (DO NOT REMOVE). Bathing/ Shower/ Hygiene May shower with protection but do not get wound dressing(s) wet. Protect dressing(s) with water repellant cover (for example, large plastic bag) or a cast cover and may then take shower. Off-Loading Multipodus Splint to: - prevalon boot to both feet Turn and reposition every 2 hours Home Health No change in wound care orders this week;  continue Home Health for wound care. May utilize formulary equivalent dressing for wound treatment orders unless otherwise specified. - may chage outer dressing only on ankles, DO NOT remove sterostrips Dressing changes to be completed by Home Health on Monday / Wednesday / Friday except when patient has scheduled visit at [facility]. Other Home Health Orders/Instructions: - [facility] Wound Treatment Wound #1 - Malleolus Wound Laterality: Right, Medial Cleanser: Normal Saline (Generic) 1 x Per Week/30 Days Discharge Instructions: Cleanse the wound with Normal Saline prior to applying a clean dressing using gauze sponges, not tissue or cotton balls. Cleanser: Byram Ancillary Kit - 15 Day Supply (Generic) 1 x Per Week/30 Days Discharge Instructions: Use supplies as instructed; Kit contains: (15) Saline Bullets; (15) 3x3 Gauze; 15 pr Gloves Peri-Wound Care: Skin Prep (Generic) 1 x Per Week/30 Days MARQITA, BECERRIL A ([id number]) [id number] Page 7 of 15 Discharge Instructions: Use skin prep as directed Prim Dressing: Cutimed Sorbact Swab 1 x Per Week/30  on the left lateral ankle wound. Electronic Signature(s) Signed: 09/08/2022 1:37:18 PM By: Duanne Guess MD FACS Entered By: Duanne Guess on 09/08/2022 10:37:18 -------------------------------------------------------------------------------- Physical Exam Details Patient Name: Date of Service: Andrea Yu 09/08/2022 12:30 PM Medical Record Number: [id number] Patient Account Number: [id number] Date of Birth/Sex: Treating RN: 09-15-1940 (82 y.o. F) Primary Care Provider: Jarome Matin Other Clinician: Referring Provider: Treating Provider/Extender: Marena Chancy in Treatment: 41 Constitutional . . . . no acute distress. Respiratory Normal work of breathing on room air. Notes MELISSAANN, AXLINE ([id number]) [id number] Page 6 of 15 09/08/2022: The buttock ulcer cavity is smaller again today. It is shallower and narrower. Both ankle wounds were a little bit smaller, as well. There is a fair amount of slough on the left lateral ankle wound. Electronic Signature(s) Signed: 09/08/2022 1:37:51 PM By: Duanne Guess MD FACS Entered By: Duanne Guess on 09/08/2022  10:37:51 -------------------------------------------------------------------------------- Physician Orders Details Patient Name: Date of Service: Andrea Stacks Y A. 09/08/2022 12:30 PM Medical Record Number: [id number] Patient Account Number: [id number] Date of Birth/Sex: Treating RN: 09/27/1940 (82 y.o. 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 - Dr. Lady Gary - Room 1 Return A Anesthetic (In clinic) Topical Lidocaine 4% applied to wound bed Cellular or Tissue Based Products Wound #1 Right,Medial Malleolus Cellular or Tissue Based Product Type: - oasis tri-layer #8 Cellular or Tissue Based Product applied to wound bed, secured with steri-strips, cover with Adaptic or Mepitel. (DO NOT REMOVE). Wound #4 Left,Lateral Ankle Cellular or Tissue Based Product Type: - oasis tri-layer #8 Cellular or Tissue Based Product applied to wound bed, secured with steri-strips, cover with Adaptic or Mepitel. (DO NOT REMOVE). Bathing/ Shower/ Hygiene May shower with protection but do not get wound dressing(s) wet. Protect dressing(s) with water repellant cover (for example, large plastic bag) or a cast cover and may then take shower. Off-Loading Multipodus Splint to: - prevalon boot to both feet Turn and reposition every 2 hours Home Health No change in wound care orders this week;  continue Home Health for wound care. May utilize formulary equivalent dressing for wound treatment orders unless otherwise specified. - may chage outer dressing only on ankles, DO NOT remove sterostrips Dressing changes to be completed by Home Health on Monday / Wednesday / Friday except when patient has scheduled visit at [facility]. Other Home Health Orders/Instructions: - [facility] Wound Treatment Wound #1 - Malleolus Wound Laterality: Right, Medial Cleanser: Normal Saline (Generic) 1 x Per Week/30 Days Discharge Instructions: Cleanse the wound with Normal Saline prior to applying a clean dressing using gauze sponges, not tissue or cotton balls. Cleanser: Byram Ancillary Kit - 15 Day Supply (Generic) 1 x Per Week/30 Days Discharge Instructions: Use supplies as instructed; Kit contains: (15) Saline Bullets; (15) 3x3 Gauze; 15 pr Gloves Peri-Wound Care: Skin Prep (Generic) 1 x Per Week/30 Days MARQITA, BECERRIL A ([id number]) [id number] Page 7 of 15 Discharge Instructions: Use skin prep as directed Prim Dressing: Cutimed Sorbact Swab 1 x Per Week/30  the ankle wounds. Oasis Tri layer was then moistened with saline and applied to each wound. I used some drawtex to hold the skin substitute down in the left ankle wound. This was not required for the right ankle wound, which is flush with the surrounding surface. Everything was secured in place with Sorbact and Steri-Strips. She will follow-up in 1 week. Electronic Signature(s) Signed: 09/16/2022 3:06:47 PM By: Duanne Guess MD FACS Signed: 10/11/2022 4:46:56 PM By: Zenaida Deed RN, BSN Previous Signature: 09/12/2022 10:48:24 AM Version By: Shawn Stall RN, BSN Previous Signature: 09/13/2022 8:35:27 AM Version By: Duanne Guess MD FACS Previous Signature: 09/08/2022 1:39:24 PM Version By: Duanne Guess MD FACS [name]  ([id number]) [id number] Page 13 of 15 Entered By: Zenaida Deed on 09/15/2022 09:02:07 -------------------------------------------------------------------------------- HxROS Details Patient Name: Date of Service: Andrea Yu 09/08/2022 12:30 PM Medical Record Number: [id number] Patient Account Number: [id number] Date of Birth/Sex: Treating RN: 1940/08/11 (82 y.o. F) Primary Care Provider: Jarome Matin Other Clinician: Referring Provider: Treating Provider/Extender: Marena Chancy in Treatment: 41 Information Obtained From Patient Caregiver Chart Constitutional Symptoms (General Health) Medical History: Past Medical History Notes: morbid obesity Eyes Medical History: Positive for: Cataracts - bil extracted Negative for: Glaucoma; Optic Neuritis Ear/Nose/Mouth/Throat Medical History: Past Medical History Notes: hard of hearing Cardiovascular Medical History: Positive for: Arrhythmia - afib; Hypertension Gastrointestinal Medical History: Past Medical History Notes: GERD Endocrine Medical History: Negative for: Type I Diabetes; Type II Diabetes Past Medical History Notes: adrenal insufficiency Genitourinary Medical History: Negative for: End Stage Renal Disease Integumentary (Skin) Medical History: Negative for: History of Burn Musculoskeletal Medical History: Positive for: Osteoarthritis; Osteomyelitis - pelvic Past Medical History Notes: psoriatic arthritis, thoracic discitis, displaced spiral fx right tibia Neurologic Medical History: Positive for: Neuropathy Past Medical History NotesTREVOR, KEHN ([id number]) [id number] Page 14 of 15 hx [facility] Oncologic Medical History: Negative for: Received Chemotherapy; Received Radiation Psychiatric Medical History: Positive for: Confinement Anxiety Negative for: Anorexia/bulimia HBO Extended History  Items Eyes: Cataracts Immunizations Pneumococcal Vaccine: Received Pneumococcal Vaccination: Yes Received Pneumococcal Vaccination On or After 60th Birthday: Yes Implantable Devices None Hospitalization / Surgery History Type of Hospitalization/Surgery TEE right ankle fusion bil knee replacements bil cataract extractions posterior lumbar fusion vaginal hysterectomy cholecystectomy Family and Social History Cancer: Yes - Father,Maternal Grandparents,Siblings,Child; Diabetes: No; Heart Disease: Yes - Father; Hereditary Spherocytosis: No; Hypertension: Yes - Father; Kidney Disease: No; Lung Disease: No; Seizures: No; Stroke: No; Thyroid Problems: Yes - Mother; Tuberculosis: No; Former smoker - quit 40 yr ago; Marital Status - Widowed; Alcohol Use: Never; Drug Use: No History; Caffeine Use: Daily - tea, soda; Financial Concerns: No; Food, Clothing or Shelter Needs: No; Support System Lacking: No; Transportation Concerns: No Psychologist, prison and probation services) Signed: 09/08/2022 2:33:10 PM By: Duanne Guess MD FACS Entered By: Duanne Guess on 09/08/2022 10:37:26 -------------------------------------------------------------------------------- SuperBill Details Patient Name: Date of Service: Andrea Yu 09/08/2022 Medical Record Number: [id number] Patient Account Number: [id number] Date of Birth/Sex: Treating RN: 11-Sep-1940 (82 y.o. 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 OPLE, ISHIBASHI A ([id number]) [id number]  Page 15 of 15 Z79.52 Long term (current) use of systemic steroids I10  Electronic Signature(s) Signed: 09/16/2022 3:06:47 PM By: Duanne Guess MD FACS Signed: 10/11/2022 4:46:56 PM By: Zenaida Deed RN, BSN Previous Signature: 09/08/2022 2:35:40 PM Version By: Tommie Ard RN Previous Signature: 09/08/2022 2:57:57 PM Version By: Duanne Guess MD FACS [name] ([id number]) [id number] Page 3 of 15 Previous Signature: 09/08/2022 2:57:57 PM Version By: Duanne Guess MD FACS Previous Signature: 09/08/2022 2:33:10 PM Version By: Duanne Guess MD FACS Entered By: Zenaida Deed on 09/15/2022 09:00:58 -------------------------------------------------------------------------------- Debridement Details Patient Name: Date of Service: Andrea Lain A. 09/08/2022 12:30 PM Medical Record Number: [id number] Patient Account Number: [id number] Date of Birth/Sex: Treating RN: 1940-09-24 (82 y.o. Andrea Yu Primary Care Provider: Jarome Matin Other Clinician: Referring Provider: Treating Provider/Extender: Marena Chancy in Treatment: 41 Debridement Performed for Assessment: Wound #4 Left,Lateral Ankle Performed By: Physician Duanne Guess, MD Debridement Type: Debridement Level of Consciousness (Pre-procedure): Awake and Alert Pre-procedure Verification/Time Out Yes - 14:20 Taken: Start Time: 14:21 Pain Control: Lidocaine 5% topical ointment Percent of Wound Bed Debrided: 100% T  Area Debrided (cm): otal 0.78 Tissue and other material debrided: Non-Viable, Slough, [employer], Other: dry skin Level: Non-Viable Tissue Debridement Description: Selective/Open Wound Instrument: Curette Bleeding: Minimum Hemostasis Achieved: Pressure Response to Treatment: Procedure was tolerated well Level of Consciousness (Post- Awake and Alert procedure): Post Debridement Measurements of Total Wound Length: (cm) 1 Stage: Category/Stage IV Width: (cm) 1 Depth: (cm) 0.4 Volume: (cm) 0.314 Character of Wound/Ulcer Post Debridement: Requires Further Debridement Post Procedure Diagnosis Same as Pre-procedure Notes Scribed for Dr Lady Gary by Tommie Ard, RN. Electronic Signature(s) Signed: 09/08/2022 2:33:10 PM By: Duanne Guess MD FACS Signed: 09/08/2022 2:45:07 PM By: Tommie Ard RN Entered By: Tommie Ard on 09/08/2022 10:23:27 -------------------------------------------------------------------------------- Debridement Details Patient Name: Date of Service: Andrea Yu 09/08/2022 12:30 PM Medical Record Number: [id number] Patient Account Number: [id number] Date of Birth/Sex: Treating RN: [birth year] (82 y.o. Andrea Yu Primary Care Provider: Jarome Matin Other Clinician: Referring Provider: Treating Provider/Extender: Marena Chancy in Treatment: 62 Debridement Performed for Assessment: Wound #1 Right,Medial Malleolus Performed By: Physician Duanne Guess, MD [name] ([id number]) [id number] Page 4 of 15 Debridement Type: Debridement Level of Consciousness (Pre-procedure): Awake and Alert Pre-procedure Verification/Time Out Yes - 14:20 Taken: Start Time: 14:21 Pain Control: Lidocaine 5% topical ointment Percent of Wound Bed Debrided: 100% T Area Debrided (cm): otal 0.49 Tissue and other material debrided: Non-Viable, Eschar, Slough, [employer], Other: dry skin Level: Non-Viable  Tissue Debridement Description: Selective/Open Wound Instrument: Curette Bleeding: Minimum Hemostasis Achieved: Pressure Response to Treatment: Procedure was tolerated well Level of Consciousness (Post- Awake and Alert procedure): Post Debridement Measurements of Total Wound Length: (cm) 0.7 Stage: Category/Stage III Width: (cm) 0.9 Depth: (cm) 0.1 Volume: (cm) 0.049 Character of Wound/Ulcer Post Debridement: Requires Further Debridement Post Procedure Diagnosis Same as Pre-procedure Notes Scribed for Dr Lady Gary by Tommie Ard, RN. Electronic Signature(s) Signed: 09/08/2022 2:33:10 PM By: Duanne Guess MD FACS Signed: 09/08/2022 2:45:07 PM By: Tommie Ard RN Entered By: Tommie Ard on 09/08/2022 10:24:45 -------------------------------------------------------------------------------- HPI Details Patient Name: Date of Service: Andrea Stacks Y A. 09/08/2022 12:30 PM Medical Record Number: [id number] Patient Account Number: [id number] Date of Birth/Sex: Treating RN: January 12, 1941 (82 y.o. 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 She is not diabetic and  the ankle wounds. Oasis Tri layer was then moistened with saline and applied to each wound. I used some drawtex to hold the skin substitute down in the left ankle wound. This was not required for the right ankle wound, which is flush with the surrounding surface. Everything was secured in place with Sorbact and Steri-Strips. She will follow-up in 1 week. Electronic Signature(s) Signed: 09/16/2022 3:06:47 PM By: Duanne Guess MD FACS Signed: 10/11/2022 4:46:56 PM By: Zenaida Deed RN, BSN Previous Signature: 09/12/2022 10:48:24 AM Version By: Shawn Stall RN, BSN Previous Signature: 09/13/2022 8:35:27 AM Version By: Duanne Guess MD FACS Previous Signature: 09/08/2022 1:39:24 PM Version By: Duanne Guess MD FACS [name]  ([id number]) [id number] Page 13 of 15 Entered By: Zenaida Deed on 09/15/2022 09:02:07 -------------------------------------------------------------------------------- HxROS Details Patient Name: Date of Service: Andrea Yu 09/08/2022 12:30 PM Medical Record Number: [id number] Patient Account Number: [id number] Date of Birth/Sex: Treating RN: 1940/08/11 (82 y.o. F) Primary Care Provider: Jarome Matin Other Clinician: Referring Provider: Treating Provider/Extender: Marena Chancy in Treatment: 41 Information Obtained From Patient Caregiver Chart Constitutional Symptoms (General Health) Medical History: Past Medical History Notes: morbid obesity Eyes Medical History: Positive for: Cataracts - bil extracted Negative for: Glaucoma; Optic Neuritis Ear/Nose/Mouth/Throat Medical History: Past Medical History Notes: hard of hearing Cardiovascular Medical History: Positive for: Arrhythmia - afib; Hypertension Gastrointestinal Medical History: Past Medical History Notes: GERD Endocrine Medical History: Negative for: Type I Diabetes; Type II Diabetes Past Medical History Notes: adrenal insufficiency Genitourinary Medical History: Negative for: End Stage Renal Disease Integumentary (Skin) Medical History: Negative for: History of Burn Musculoskeletal Medical History: Positive for: Osteoarthritis; Osteomyelitis - pelvic Past Medical History Notes: psoriatic arthritis, thoracic discitis, displaced spiral fx right tibia Neurologic Medical History: Positive for: Neuropathy Past Medical History NotesTREVOR, KEHN ([id number]) [id number] Page 14 of 15 hx [facility] Medical History: Negative for: Received Chemotherapy; Received Radiation Psychiatric Medical History: Positive for: Confinement Anxiety Negative for: Anorexia/bulimia HBO Extended History  Items Eyes: Cataracts Immunizations Pneumococcal Vaccine: Received Pneumococcal Vaccination: Yes Received Pneumococcal Vaccination On or After 60th Birthday: Yes Implantable Devices None Hospitalization / Surgery History Type of Hospitalization/Surgery TEE right ankle fusion bil knee replacements bil cataract extractions posterior lumbar fusion vaginal hysterectomy cholecystectomy Family and Social History Cancer: Yes - Father,Maternal Grandparents,Siblings,Child; Diabetes: No; Heart Disease: Yes - Father; Hereditary Spherocytosis: No; Hypertension: Yes - Father; Kidney Disease: No; Lung Disease: No; Seizures: No; Stroke: No; Thyroid Problems: Yes - Mother; Tuberculosis: No; Former smoker - quit 40 yr ago; Marital Status - Widowed; Alcohol Use: Never; Drug Use: No History; Caffeine Use: Daily - tea, soda; Financial Concerns: No; Food, Clothing or Shelter Needs: No; Support System Lacking: No; Transportation Concerns: No Psychologist, prison and probation services) Signed: 09/08/2022 2:33:10 PM By: Duanne Guess MD FACS Entered By: Duanne Guess on 09/08/2022 10:37:26 -------------------------------------------------------------------------------- SuperBill Details Patient Name: Date of Service: Andrea Yu 09/08/2022 Medical Record Number: [id number] Patient Account Number: [id number] Date of Birth/Sex: Treating RN: 11-Sep-1940 (82 y.o. 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 Oasis Tri layer was then moistened with saline and applied to each wound. I used some drawtex to hold the skin substitute down in the left ankle wound. This was not required for the right ankle wound, which is flush with the surrounding surface. Everything was secured in place with Sorbact and Steri-Strips. She will follow-up in 1 week. Electronic Signature(s) Signed: 09/16/2022 3:06:47 PM By: Duanne Guess MD FACS Signed: 10/11/2022 4:46:56 PM By: Zenaida Deed RN, BSN Previous Signature: 09/12/2022 10:48:24 AM Version By: Shawn Stall RN, BSN Previous Signature: 09/13/2022 8:35:27 AM Version By: Duanne Guess MD FACS Previous Signature: 09/08/2022 1:39:24 PM Version By: Duanne Guess MD FACS [name]  ([id number]) [id number] Page 13 of 15 Entered By: Zenaida Deed on 09/15/2022 09:02:07 -------------------------------------------------------------------------------- HxROS Details Patient Name: Date of Service: Andrea Yu 09/08/2022 12:30 PM Medical Record Number: [id number] Patient Account Number: [id number] Date of Birth/Sex: Treating RN: 1940/08/11 (82 y.o. F) Primary Care Provider: Jarome Matin Other Clinician: Referring Provider: Treating Provider/Extender: Marena Chancy in Treatment: 41 Information Obtained From Patient Caregiver Chart Constitutional Symptoms (General Health) Medical History: Past Medical History Notes: morbid obesity Eyes Medical History: Positive for: Cataracts - bil extracted Negative for: Glaucoma; Optic Neuritis Ear/Nose/Mouth/Throat Medical History: Past Medical History Notes: hard of hearing Cardiovascular Medical History: Positive for: Arrhythmia - afib; Hypertension Gastrointestinal Medical History: Past Medical History Notes: GERD Endocrine Medical History: Negative for: Type I Diabetes; Type II Diabetes Past Medical History Notes: adrenal insufficiency Genitourinary Medical History: Negative for: End Stage Renal Disease Integumentary (Skin) Medical History: Negative for: History of Burn Musculoskeletal Medical History: Positive for: Osteoarthritis; Osteomyelitis - pelvic Past Medical History Notes: psoriatic arthritis, thoracic discitis, displaced spiral fx right tibia Neurologic Medical History: Positive for: Neuropathy Past Medical History NotesTREVOR, KEHN ([id number]) [id number] Page 14 of 15 hx [facility] Medical History: Negative for: Received Chemotherapy; Received Radiation Psychiatric Medical History: Positive for: Confinement Anxiety Negative for: Anorexia/bulimia HBO Extended History  Items Eyes: Cataracts Immunizations Pneumococcal Vaccine: Received Pneumococcal Vaccination: Yes Received Pneumococcal Vaccination On or After 60th Birthday: Yes Implantable Devices None Hospitalization / Surgery History Type of Hospitalization/Surgery TEE right ankle fusion bil knee replacements bil cataract extractions posterior lumbar fusion vaginal hysterectomy cholecystectomy Family and Social History Cancer: Yes - Father,Maternal Grandparents,Siblings,Child; Diabetes: No; Heart Disease: Yes - Father; Hereditary Spherocytosis: No; Hypertension: Yes - Father; Kidney Disease: No; Lung Disease: No; Seizures: No; Stroke: No; Thyroid Problems: Yes - Mother; Tuberculosis: No; Former smoker - quit 40 yr ago; Marital Status - Widowed; Alcohol Use: Never; Drug Use: No History; Caffeine Use: Daily - tea, soda; Financial Concerns: No; Food, Clothing or Shelter Needs: No; Support System Lacking: No; Transportation Concerns: No Psychologist, prison and probation services) Signed: 09/08/2022 2:33:10 PM By: Duanne Guess MD FACS Entered By: Duanne Guess on 09/08/2022 10:37:26 -------------------------------------------------------------------------------- SuperBill Details Patient Name: Date of Service: Andrea Yu 09/08/2022 Medical Record Number: [id number] Patient Account Number: [id number] Date of Birth/Sex: Treating RN: 11-Sep-1940 (82 y.o. 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

## 2022-09-08 NOTE — Progress Notes (Signed)
Andrea, BENDURE Yu ([id number]) [id number] Page 1 of 10 Visit Report for 09/08/2022 Arrival Information Details Patient Name: Date of Service: Andrea Yu 09/08/2022 12:30 PM Medical Record Number: [id number] Patient Account Number: [id number] Date of Birth/Sex: Treating RN: 04/16/1940 (82 y.o. Roselee Nova, Jamie Primary Care Ailyne Yu: Jarome Matin Other Clinician: Referring Amire Leazer: Treating Myrlene Riera/Extender: Marena Chancy in Treatment: 87 Visit Information History Since Last Visit Added or deleted any medications: No Patient Arrived: Ambulatory Any new allergies or adverse reactions: No Arrival Time: 12:49 Had Yu fall or experienced change in No Accompanied By: caregiver activities of daily living that may affect Transfer Assistance: None risk of falls: Patient Identification Verified: Yes Signs or symptoms of abuse/neglect since last visito No Secondary Verification Process Completed: Yes Hospitalized since last visit: No Patient Requires Transmission-Based Precautions: No Implantable device outside of the clinic excluding No Patient Has Alerts: No cellular tissue based products placed in the center since last visit: Has Dressing in Place as Prescribed: Yes Pain Present Now: No Electronic Signature(s) Signed: 09/08/2022 2:45:07 PM By: Tommie Ard RN Entered By: Tommie Ard on 09/08/2022 12:49:44 -------------------------------------------------------------------------------- Clinic Level of Care Assessment Details Patient Name: Date of Service: Andrea Yu 09/08/2022 12:30 PM Medical Record Number: [id number] Patient Account Number: [id number] Date of Birth/Sex: Treating RN: 1940-07-04 (82 y.o. Andrea Yu Primary Care Brunetta Newingham: Jarome Matin Other Clinician: Referring Trafton Roker: Treating Salar Molden/Extender: Marena Chancy in Treatment: 41 Clinic Level of Care Assessment  Items TOOL 1 Quantity Score []  - 0 Use when EandM and Procedure is performed on INITIAL visit ASSESSMENTS - Nursing Assessment / Reassessment []  - 0 General Physical Exam (combine w/ comprehensive assessment (listed just below) when performed on new pt. evals) []  - 0 Comprehensive Assessment (HX, ROS, Risk Assessments, Wounds Hx, etc.) ASSESSMENTS - Wound and Skin Assessment / Reassessment []  - 0 Dermatologic / Skin Assessment (not related to wound area) ASSESSMENTS - Ostomy and/or Continence Assessment and Care []  - 0 Incontinence Assessment and Management []  - 0 Ostomy Care Assessment and Management (repouching, etc.) PROCESS - Coordination of Care []  - 0 Simple Patient / Family Education for ongoing care []  - 0 Complex (extensive) Patient / Family Education for ongoing care ONEYDA, DEJOSEPH Yu ([id number]) [id number] Page 2 of 10 []  - 0 Staff obtains Consents, Records, T Results / Process Orders est []  - 0 Staff telephones HHA, Nursing Homes / Clarify orders / etc []  - 0 Routine Transfer to another Facility (non-emergent condition) []  - 0 Routine Hospital Admission (non-emergent condition) []  - 0 New Admissions / Manufacturing engineer / Ordering NPWT Apligraf, etc. , []  - 0 Emergency Hospital Admission (emergent condition) PROCESS - Special Needs []  - 0 Pediatric / Minor Patient Management []  - 0 Isolation Patient Management []  - 0 Hearing / Language / Visual special needs []  - 0 Assessment of Community assistance (transportation, D/C planning, etc.) []  - 0 Additional assistance / Altered mentation []  - 0 Support Surface(s) Assessment (bed, cushion, seat, etc.) INTERVENTIONS - Miscellaneous []  - 0 External ear exam []  - 0 Patient Transfer (multiple staff / Nurse, adult / Similar devices) []  - 0 Simple Staple / Suture removal (25 or less) []  - 0 Complex Staple / Suture removal (26 or more) []  - 0 Hypo/Hyperglycemic Management (do not  check if billed separately) []  - 0 Ankle / Brachial Index (ABI) - do not check if billed separately Has the patient been seen at the hospital within the  last three years: Yes Total Score: 0 Level Of Care: ____ Electronic Signature(s) Signed: 09/08/2022 2:45:07 PM By: Tommie Ard RN Entered By: Tommie Ard on 09/08/2022 14:31:24 -------------------------------------------------------------------------------- Lower Extremity Assessment Details Patient Name: Date of Service: Andrea Yu 09/08/2022 12:30 PM Medical Record Number: [id number] Patient Account Number: [id number] Date of Birth/Sex: Treating RN: 17-Dec-1940 (82 y.o. Andrea Yu Primary Care Garth Diffley: Jarome Matin Other Clinician: Referring Kline Bulthuis: Treating Avrey Hyser/Extender: Marena Chancy in Treatment: 41 Edema Assessment Assessed: [Left: No] [Right: No] Edema: [Left: No] [Right: No] Calf Left: Right: Point of Measurement: From Medial Instep 28 cm 32 cm Ankle Left: Right: Point of Measurement: From Medial Instep 18.2 cm 22 cm Vascular Assessment TOIA, CHATELAIN Yu ([id number]) [Right:[id number] Page 3 of 10] Pulses: Dorsalis Pedis Palpable: [Left:Yes] [Right:Yes] Extremity colors, hair growth, and conditions: Extremity Color: [Left:Hyperpigmented] [Right:Hyperpigmented] Hair Growth on Extremity: [Left:No] [Right:No] Temperature of Extremity: [Left:Warm < 3 seconds] [Right:Warm < 3 seconds] Electronic Signature(s) Signed: 09/08/2022 2:45:07 PM By: Tommie Ard RN Entered By: Tommie Ard on 09/08/2022 12:51:44 -------------------------------------------------------------------------------- Multi Wound Chart Details Patient Name: Date of Service: Andrea Stacks [name] 09/08/2022 12:30 PM Medical Record Number: [id number] Patient Account Number: [id number] Date of Birth/Sex: Treating RN: [birth year] (81 y.o. F) Primary Care [name]: Jarome Matin Other  Clinician: Referring Maddelyn Rocca: Treating Treon Kehl/Extender: Marena Chancy in Treatment: 41 Vital Signs Height(in): 59 Pulse(bpm): 83 Weight(lbs): 155 Blood Pressure(mmHg): 111/73 Body Mass Index(BMI): 31.3 Temperature(F): 98.0 Respiratory Rate(breaths/min): 18 [1:Photos:] Right, Medial Malleolus Right Gluteus Left, Lateral Ankle Wound Location: Pressure Injury Bump Pressure Injury Wounding Event: Pressure Ulcer Abscess Pressure Ulcer Primary Etiology: Cataracts, Arrhythmia, Hypertension, Cataracts, Arrhythmia, Hypertension, Cataracts, Arrhythmia, Hypertension, Comorbid History: Osteoarthritis, Osteomyelitis, Osteoarthritis, Osteomyelitis, Osteoarthritis, Osteomyelitis, Neuropathy, Confinement Anxiety Neuropathy, Confinement Anxiety Neuropathy, Confinement Anxiety 11/16/2021 09/28/2021 12/23/2021 Date Acquired: 41 41 35 Weeks of Treatment: Open Open Open Wound Status: No No No Wound Recurrence: 0.7x0.9x0.1 1x2x2.7 1x1x0.4 Measurements L x W x D (cm) 0.495 1.571 0.785 Yu (cm) : rea 0.049 4.241 0.314 Volume (cm) : 67.80% 85.00% -456.70% % Reduction in Yu rea: 68.20% 86.50% -2142.90% % Reduction in Volume: 1 Position 1 (o'clock): 3.2 Maximum Distance 1 (cm): 12 Starting Position 1 (o'clock): 12 Ending Position 1 (o'clock): 0.5 Maximum Distance 1 (cm): No Yes No Tunneling: No No Yes Undermining: Category/Stage III Full Thickness With Exposed Support Category/Stage IV Classification: Structures Medium Medium Medium Exudate Amount: Serosanguineous Serous Serosanguineous Exudate Type: red, brown amber red, brown Exudate Color: Distinct, outline attached Epibole Distinct, outline attached Wound Margin: Large (67-100%) Large (67-100%) Medium (34-66%) Granulation Amount: MOET, DEBRUYNE Yu ([id number]) [phone].pdf Page 4 of 10 Pink, Pale Red Pink, Pale Granulation Quality: Small (1-33%) Small (1-33%) Medium  (34-66%) Necrotic Amount: Fat Layer (Subcutaneous Tissue): Yes Fat Layer (Subcutaneous Tissue): Yes Fat Layer (Subcutaneous Tissue): Yes Exposed Structures: Fascia: No Fascia: No Tendon: Yes Tendon: No Tendon: No Fascia: No Muscle: No Muscle: No Muscle: No Joint: No Joint: No Joint: No Bone: No Bone: No Bone: No Small (1-33%) Small (1-33%) None Epithelialization: Debridement - Selective/Open Wound N/Yu Debridement - Selective/Open Wound Debridement: Pre-procedure Verification/Time Out 14:20 N/Yu 14:20 Taken: Lidocaine 5% topical ointment N/Yu Lidocaine 5% topical ointment Pain Control: Necrotic/Eschar, Other, Slough N/Yu Other, Slough Tissue Debrided: Non-Viable Tissue N/Yu Non-Viable Tissue Level: 0.49 N/Yu 0.78 Debridement Yu (sq cm): rea Curette N/Yu Curette Instrument: Minimum N/Yu Minimum Bleeding: Pressure N/Yu Pressure Hemostasis Yu chieved: Procedure was tolerated well N/Yu Procedure was tolerated well Debridement Treatment Response: 0.7x0.9x0.1  N/Yu 1x1x0.4 Post Debridement Measurements L x W x D (cm) 0.049 N/Yu 0.314 Post Debridement Volume: (cm) Category/Stage III N/Yu Category/Stage IV Post Debridement Stage: No Abnormalities Noted No Abnormalities Noted No Abnormalities Noted Periwound Skin Texture: No Abnormalities Noted No Abnormalities Noted No Abnormalities Noted Periwound Skin Moisture: Erythema: No Erythema: No Erythema: No Periwound Skin Color: Rubor: No No Abnormality No Abnormality No Abnormality Temperature: N/Yu Yes Yes Tenderness on Palpation: Cellular or Tissue Based Product N/Yu Cellular or Tissue Based Product Procedures Performed: Debridement Debridement Treatment Notes Electronic Signature(s) Signed: 09/08/2022 1:35:05 PM By: Duanne Guess MD FACS Entered By: Duanne Guess on 09/08/2022 13:35:05 -------------------------------------------------------------------------------- Multi-Disciplinary Care Plan Details Patient Name:  Date of Service: Andrea Stacks [name] 09/08/2022 12:30 PM Medical Record Number: [id number] Patient Account Number: [id number] Date of Birth/Sex: Treating RN: [birth year] (81 y.o. Andrea Yu Primary Care Chyanna Flock: Jarome Matin Other Clinician: Referring Jamil Castillo: Treating Daril Warga/Extender: Marena Chancy in Treatment: 44 Multidisciplinary Care Plan reviewed with physician Active Inactive Pressure Nursing Diagnoses: Knowledge deficit related to causes and risk factors for pressure ulcer development Knowledge deficit related to management of pressures ulcers Potential for impaired tissue integrity related to pressure, friction, moisture, and shear Goals: Patient/caregiver will verbalize understanding of pressure ulcer management Date Initiated: 11/24/2021 Target Resolution Date: 09/24/2022 Goal Status: Active Interventions: Assess: immobility, friction, shearing, incontinence upon admission and as needed Assess offloading mechanisms upon admission and as needed Assess potential for pressure ulcer upon admission and as needed Provide education on pressure ulcers LOURDEZ, CONFAIR Yu ([id number]) [phone].pdf Page 5 of 10 Treatment Activities: Pressure reduction/relief device ordered : 11/24/2021 Notes: Wound/Skin Impairment Nursing Diagnoses: Impaired tissue integrity Knowledge deficit related to ulceration/compromised skin integrity Goals: Patient/caregiver will verbalize understanding of skin care regimen Date Initiated: 11/24/2021 Target Resolution Date: 09/24/2022 Goal Status: Active Ulcer/skin breakdown will have Yu volume reduction of 30% by week 4 Date Initiated: 11/24/2021 Date Inactivated: 05/31/2022 Target Resolution Date: 05/25/2022 Unmet Reason: refuses VAC, Goal Status: Unmet noncompliant with offloading Interventions: Assess patient/caregiver ability to obtain necessary supplies Assess patient/caregiver ability to  perform ulcer/skin care regimen upon admission and as needed Assess ulceration(s) every visit Provide education on ulcer and skin care Treatment Activities: Skin care regimen initiated : 11/24/2021 Topical wound management initiated : 11/24/2021 Notes: Electronic Signature(s) Signed: 09/08/2022 2:30:00 PM By: Tommie Ard RN Entered By: Tommie Ard on 09/08/2022 14:29:59 -------------------------------------------------------------------------------- Pain Assessment Details Patient Name: Date of Service: [name] 09/08/2022 12:30 PM Medical Record Number: [id number] Patient Account Number: [id number] Date of Birth/Sex: Treating RN: 16-May-1940 (82 y.o. Andrea Yu Primary Care Janetta Vandoren: Jarome Matin Other Clinician: Referring Ricardo Kayes: Treating Christie Viscomi/Extender: Marena Chancy in Treatment: 87 Active Problems Location of Pain Severity and Description of Pain Patient Has Paino No Site Locations [place] Yu ([id number]) [id number] Page 6 of 10 Pain Management and Medication Current Pain Management: Electronic Signature(s) Signed: 09/08/2022 2:45:07 PM By: Tommie Ard RN Entered By: Tommie Ard on 09/08/2022 12:51:11 -------------------------------------------------------------------------------- Patient/Caregiver Education Details Patient Name: Date of Service: Andrea Yu 8/14/2024andnbsp12:30 PM Medical Record Number: [id number] Patient Account Number: [id number] Date of Birth/Gender: Treating RN: 1940/03/14 (82 y.o. Andrea Yu Primary Care Physician: Jarome Matin Other Clinician: Referring Physician: Treating Physician/Extender: Marena Chancy in Treatment: 18 Education Assessment Education Provided To: Patient Education Topics Provided Wound Debridement: Methods: Explain/Verbal Responses: State content correctly Wound/Skin Impairment: Methods:  Explain/Verbal Responses: Reinforcements needed, State content correctly Electronic Signature(s) Signed: 09/08/2022  2:45:07 PM By: Tommie Ard RN Entered By: Tommie Ard on 09/08/2022 14:30:45 -------------------------------------------------------------------------------- Wound Assessment Details Patient Name: Date of Service: Andrea Yu 09/08/2022 12:30 PM Medical Record Number: [id number] Patient Account Number: [id number] Date of Birth/Sex: Treating RN: 10-25-1940 (82 y.o. Andrea Yu Primary Care Damante Spragg: Jarome Matin Other Clinician: Referring Damaria Vachon: Treating Kanoelani Dobies/Extender: Marena Chancy in Treatment: 41 Wound Status Wound Number: 1 Primary Pressure Ulcer Etiology: Wound Location: Right, Medial Malleolus Wound Open Wounding Event: Pressure Injury Status: Date Acquired: 11/16/2021 Comorbid Cataracts, Arrhythmia, Hypertension, Osteoarthritis, Weeks Of Treatment: 41 History: Osteomyelitis, Neuropathy, Confinement Anxiety Clustered Wound: No Photos AVRY, KOVACEVICH Yu ([id number]) [id number] Page 7 of 10 Wound Measurements Length: (cm) 0.7 Width: (cm) 0.9 Depth: (cm) 0.1 Area: (cm) 0.495 Volume: (cm) 0.049 % Reduction in Area: 67.8% % Reduction in Volume: 68.2% Epithelialization: Small (1-33%) Tunneling: No Undermining: No Wound Description Classification: Category/Stage III Wound Margin: Distinct, outline attached Exudate Amount: Medium Exudate Type: Serosanguineous Exudate Color: red, brown Foul Odor After Cleansing: No Slough/Fibrino Yes Wound Bed Granulation Amount: Large (67-100%) Exposed Structure Granulation Quality: Pink, Pale Fascia Exposed: No Necrotic Amount: Small (1-33%) Fat Layer (Subcutaneous Tissue) Exposed: Yes Necrotic Quality: Adherent Slough Tendon Exposed: No Muscle Exposed: No Joint Exposed: No Bone Exposed: No Periwound Skin Texture Texture Color No  Abnormalities Noted: Yes No Abnormalities Noted: No Erythema: No Moisture No Abnormalities Noted: Yes Temperature / Pain Temperature: No Abnormality Electronic Signature(s) Signed: 09/08/2022 2:45:07 PM By: Tommie Ard RN Entered By: Tommie Ard on 09/08/2022 13:05:09 -------------------------------------------------------------------------------- Wound Assessment Details Patient Name: Date of Service: Andrea Yu 09/08/2022 12:30 PM Medical Record Number: [id number] Patient Account Number: [id number] Date of Birth/Sex: Treating RN: April 24, 1940 (81 y.o. Andrea Yu Primary Care Zimri Brennen: Jarome Matin Other Clinician: Referring Alleyah Twombly: Treating Geofrey Silliman/Extender: Marena Chancy in Treatment: 41 Wound Status Wound Number: 2 Primary Abscess Etiology: Wound Location: Right Gluteus Wound Open Wounding Event: Bump Status: Date Acquired: 09/28/2021 Comorbid Cataracts, Arrhythmia, Hypertension, Osteoarthritis, Weeks Of Treatment: 41 History: Osteomyelitis, Neuropathy, Confinement Anxiety Clustered Wound: No Photos VIKI, SCURTO Yu ([id number]) [phone].pdf Page 8 of 10 Wound Measurements Length: (cm) 1 Width: (cm) 2 Depth: (cm) 2.7 Area: (cm) 1.571 Volume: (cm) 4.241 % Reduction in Area: 85% % Reduction in Volume: 86.5% Epithelialization: Small (1-33%) Tunneling: Yes Position (o'clock): 1 Maximum Distance: (cm) 3.2 Undermining: No Wound Description Classification: Full Thickness With Exposed Suppo Wound Margin: Epibole Exudate Amount: Medium Exudate Type: Serous Exudate Color: amber rt Structures Foul Odor After Cleansing: No Slough/Fibrino Yes Wound Bed Granulation Amount: Large (67-100%) Exposed Structure Granulation Quality: Red Fascia Exposed: No Necrotic Amount: Small (1-33%) Fat Layer (Subcutaneous Tissue) Exposed: Yes Necrotic Quality: Adherent Slough Tendon Exposed: No Muscle Exposed:  No Joint Exposed: No Bone Exposed: No Periwound Skin Texture Texture Color No Abnormalities Noted: Yes No Abnormalities Noted: Yes Moisture Temperature / Pain No Abnormalities Noted: Yes Temperature: No Abnormality Tenderness on Palpation: Yes Electronic Signature(s) Signed: 09/08/2022 2:45:07 PM By: Tommie Ard RN Entered By: Tommie Ard on 09/08/2022 13:05:43 -------------------------------------------------------------------------------- Wound Assessment Details Patient Name: Date of Service: [name] 09/08/2022 12:30 PM Medical Record Number: [id number] Patient Account Number: [id number] Date of Birth/Sex: Treating RN: [birth year] (82 y.o. Andrea Yu Primary Care Zayven Powe: Jarome Matin Other Clinician: Referring Makaia Rappa: Treating Jaaliyah Lucatero/Extender: Marena Chancy in Treatment: 41 Wound Status Wound Number: 4 Primary Pressure Ulcer Etiology: Wound Location: Left, Lateral Ankle Wound Open Wounding Event: Pressure Injury Status:  Date Acquired: 12/23/2021 Comorbid Cataracts, Arrhythmia, Hypertension, Osteoarthritis, Weeks Of Treatment: 35 History: Osteomyelitis, Neuropathy, Confinement Anxiety CAMESHA, MARKOWICZ Yu ([id number]) [id number] Page 9 of 10 History: Osteomyelitis, Neuropathy, Confinement Anxiety Clustered Wound: No Photos Wound Measurements Length: (cm) 1 Width: (cm) 1 Depth: (cm) 0.4 Area: (cm) 0.785 Volume: (cm) 0.314 % Reduction in Area: -456.7% % Reduction in Volume: -2142.9% Epithelialization: None Tunneling: No Undermining: Yes Starting Position (o'clock): 12 Ending Position (o'clock): 12 Maximum Distance: (cm) 0.5 Wound Description Classification: Category/Stage IV Wound Margin: Distinct, outline attached Exudate Amount: Medium Exudate Type: Serosanguineous Exudate Color: red, brown Foul Odor After Cleansing: No Slough/Fibrino Yes Wound Bed Granulation Amount: Medium  (34-66%) Exposed Structure Granulation Quality: Pink, Pale Fascia Exposed: No Necrotic Amount: Medium (34-66%) Fat Layer (Subcutaneous Tissue) Exposed: Yes Necrotic Quality: Adherent Slough Tendon Exposed: Yes Muscle Exposed: No Joint Exposed: No Bone Exposed: No Periwound Skin Texture Texture Color No Abnormalities Noted: Yes No Abnormalities Noted: Yes Moisture Temperature / Pain No Abnormalities Noted: Yes Temperature: No Abnormality Tenderness on Palpation: Yes Electronic Signature(s) Signed: 09/08/2022 2:45:07 PM By: Tommie Ard RN Entered By: Tommie Ard on 09/08/2022 13:06:15 -------------------------------------------------------------------------------- Vitals Details Patient Name: Date of Service: Andrea Stacks [name] 09/08/2022 12:30 PM Medical Record Number: [id number] Patient Account Number: [id number] Date of Birth/Sex: Treating RN: 1940/11/25 (81 y.o. Andrea Yu Primary Care Miquela Costabile: Jarome Matin Other Clinician: Referring Cathline Dowen: Treating Burnell Matlin/Extender: Marena Chancy in Treatment: [address] LIEBA, CICHON Yu ([id number]) [id number] Page 10 of 10 Time Taken: 12:50 Temperature (F): 98.0 Height (in): 59 Pulse (bpm): 83 Weight (lbs): 155 Respiratory Rate (breaths/min): 18 Body Mass Index (BMI): 31.3 Blood Pressure (mmHg): 111/73 Reference Range: 80 - 120 mg / dl Electronic Signature(s) Signed: 09/08/2022 2:45:07 PM By: Tommie Ard RN Entered By: Tommie Ard on 09/08/2022 12:51:05

## 2022-09-14 ENCOUNTER — Encounter (HOSPITAL_BASED_OUTPATIENT_CLINIC_OR_DEPARTMENT_OTHER): Payer: Medicare Other | Admitting: General Surgery

## 2022-09-14 DIAGNOSIS — L89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, stage 3: Secondary | ICD-10-CM | POA: Diagnosis not present

## 2022-09-14 NOTE — Progress Notes (Signed)
Andrea [name] ([id number]) [id number] Page 1 of 11 Visit Report for 09/14/2022 Arrival Information Details Patient Name: Date of Service: Andrea [name] 09/14/2022 1:00 PM Medical Record Number: [id number] Patient Account Number: [id number] Date of Birth/Sex: Treating RN: 01-30-1940 (82 y.o. F) Primary Care Andrea [name]: Andrea [name] Other Clinician: Referring Andrea [name]: Treating Andrea [name]/Extender: Andrea [name] in Treatment: 42 Visit Information History Since Last Visit All ordered tests and consults were completed: No Patient Arrived: Wheel Chair Added or deleted any medications: No Arrival Time: 13:03 Any new allergies or adverse reactions: No Accompanied By: daughter Had [name] fall or experienced change in No Transfer Assistance: None activities of daily living that may affect Patient Identification Verified: Yes risk of falls: Secondary Verification Process Completed: Yes Signs or symptoms of abuse/neglect since last visito No Patient Requires Transmission-Based Precautions: No Hospitalized since last visit: No Patient Has Alerts: No Implantable device outside of the clinic excluding No cellular tissue based products placed in the center since last visit: Pain Present Now: Yes Electronic Signature(s) Signed: 09/14/2022 2:38:57 PM By: Andrea [name] Entered By: Andrea [name] on 09/14/2022 10:04:05 -------------------------------------------------------------------------------- Encounter Discharge Information Details Patient Name: Date of Service: Andrea [name]. 09/14/2022 1:00 PM Medical Record Number: [id number] Patient Account Number: [id number] Date of Birth/Sex: Treating RN: [birth year] (82 y.o. Katrinka Blazing Primary Care Andrea [name]: Andrea [name] Other Clinician: Referring Andrea [name]: Treating Andrea [name]/Extender: Andrea [name] in Treatment: 42 Encounter Discharge Information Items Post  Procedure Vitals Discharge Condition: Stable Temperature (F): 98.6 Ambulatory Status: Walker Pulse (bpm): 76 Discharge Destination: Home Respiratory Rate (breaths/min): 18 Transportation: Private Auto Blood Pressure (mmHg): 134/71 Accompanied By: daughter/caregiver Schedule Follow-up Appointment: Yes Clinical Summary of Care: Patient Declined Electronic Signature(s) Signed: 09/14/2022 4:27:52 PM By: Andrea Schwalbe RN Entered By: Andrea [name] on 09/14/2022 13:23:00 Andrea [name] ([id number]) [id number] Page 2 of 11 -------------------------------------------------------------------------------- Lower Extremity Assessment Details Patient Name: Date of Service: Andrea [name] 09/14/2022 1:00 PM Medical Record Number: [id number] Patient Account Number: [id number] Date of Birth/Sex: Treating RN: 02-09-1940 (82 y.o. Katrinka Blazing Primary Care Andrea [name]: Andrea [name] Other Clinician: Referring Andrea [name]: Treating Andrea [name]/Extender: Andrea [name] in Treatment: 42 Edema Assessment Assessed: [Left: No] [Right: No] Edema: [Left: No] [Right: No] Calf Left: Right: Point of Measurement: From Medial Instep 28 cm 32 cm Ankle Left: Right: Point of Measurement: From Medial Instep 18.2 cm 22 cm Vascular Assessment Pulses: Dorsalis Pedis Palpable: [Left:Yes] [Right:Yes] Extremity colors, hair growth, and conditions: Extremity Color: [Left:Hyperpigmented] [Right:Hyperpigmented] Hair Growth on Extremity: [Left:No] [Right:No] Temperature of Extremity: [Left:Warm < 3 seconds] [Right:Warm < 3 seconds] Electronic Signature(s) Signed: 09/14/2022 4:27:52 PM By: Andrea Schwalbe RN Entered By: Andrea [name] on 09/14/2022 10:30:45 -------------------------------------------------------------------------------- Multi Wound Chart Details Patient Name: Date of Service: Andrea [name]. 09/14/2022 1:00 PM Medical Record Number:  [id number] Patient Account Number: [id number] Date of Birth/Sex: Treating RN: Jun 15, 1940 (82 y.o. F) Primary Care Andrea [name]: Andrea [name] Other Clinician: Referring Andrea [name]: Treating Andrea [name]/Extender: Andrea [name] in Treatment: 42 Vital Signs Height(in): 59 Pulse(bpm): 76 Weight(lbs): 155 Blood Pressure(mmHg): 134/71 Body Mass Index(BMI): 31.3 Temperature(F): 98.6 Respiratory Rate(breaths/min): 18 [1:Photos:] [id number] Page 3 of 11] Right, Medial Malleolus Right Gluteus Left, Lateral Ankle Wound Location: Pressure Injury Bump Pressure Injury Wounding Event: Pressure Ulcer Abscess Pressure Ulcer Primary Etiology: Cataracts, Arrhythmia, Hypertension,Cataracts, Arrhythmia, Hypertension, Cataracts, Arrhythmia, Hypertension, Comorbid History: Osteoarthritis, Osteomyelitis, Osteoarthritis, Osteomyelitis, Osteoarthritis, Osteomyelitis, Neuropathy, Confinement Anxiety Neuropathy, Confinement Anxiety Neuropathy, Confinement Anxiety 11/16/2021  09/28/2021 12/23/2021 Date Acquired: 42 42 36 Weeks of Treatment: Open Open Open Wound Status: No No No Wound Recurrence: 0.7x0.9x0.1 1x2x3.1 1x1x0.3 Measurements L x W x D (cm) 0.495 1.571 0.785 [name] (cm) : rea 0.049 4.869 0.236 Volume (cm) : 67.80% 85.00% -456.70% % Reduction in [name] rea: 68.20% 84.50% -1585.70% % Reduction in Volume: 1 Position 1 (o'clock): 4.4 Maximum Distance 1 (cm): N/[name] Yes No Tunneling: Category/Stage III Full Thickness With Exposed Support Category/Stage IV Classification: Structures Medium Medium Medium Exudate [name] mount: Serosanguineous Serous Serosanguineous Exudate Type: red, brown amber red, brown Exudate Color: Distinct, outline attached Epibole Distinct, outline attached Wound Margin: Small (1-33%) Large (67-100%) Medium (34-66%) Granulation [name] mount: Red Red Pink, Pale Granulation Quality: Large (67-100%) Small (1-33%) Medium  (34-66%) Necrotic [name] mount: Eschar, Adherent Slough Adherent Andrea [name] Necrotic Tissue: Fat Layer (Subcutaneous Tissue): Yes Fat Layer (Subcutaneous Tissue): Yes Fat Layer (Subcutaneous Tissue): Yes Exposed Structures: Fascia: No Fascia: No Tendon: Yes Tendon: No Tendon: No Fascia: No Muscle: No Muscle: No Muscle: No Joint: No Joint: No Joint: No Bone: No Bone: No Bone: No Small (1-33%) Small (1-33%) None Epithelialization: Debridement - Selective/Open Wound N/[name] Debridement - Selective/Open Wound Debridement: Pre-procedure Verification/Time Out 13:45 N/[name] 13:45 Taken: Lidocaine 4% Topical Solution N/[name] Lidocaine 4% Topical Solution Pain Control: Necrotic/Eschar, Slough N/[name] Necrotic/Eschar, Slough Tissue Debrided: Non-Viable Tissue N/[name] Non-Viable Tissue Level: 0.49 N/[name] 0.78 Debridement [name] (sq cm): rea Curette N/[name] Curette Instrument: Minimum N/[name] Minimum Bleeding: Pressure N/[name] Pressure Hemostasis [name] chieved: 0 N/[name] 0 Procedural Pain: 0 N/[name] 0 Post Procedural Pain: Procedure was tolerated well N/[name] Procedure was tolerated well Debridement Treatment Response: 0.7x0.9x0.1 N/[name] 1x1x0.3 Post Debridement Measurements L x W x D (cm) 0.049 N/[name] 0.236 Post Debridement Volume: (cm) Category/Stage III N/[name] Category/Stage IV Post Debridement Stage: No Abnormalities Noted No Abnormalities Noted No Abnormalities Noted Periwound Skin Texture: No Abnormalities Noted No Abnormalities Noted No Abnormalities Noted Periwound Skin Moisture: Erythema: No Erythema: No Erythema: No Periwound Skin Color: Rubor: No No Abnormality No Abnormality No Abnormality Temperature: N/[name] Yes Yes Tenderness on Palpation: Cellular or Tissue Based Product N/[name] Cellular or Tissue Based Product Procedures Performed: Debridement Debridement Treatment Notes Electronic Signature(s) Signed: 09/14/2022 2:59:56 PM By: Duanne Guess MD FACS Entered By: Duanne Guess on 09/14/2022  11:59:56 Andrea [name] ([id number]) [id number] Page 4 of 11 -------------------------------------------------------------------------------- Multi-Disciplinary Care Plan Details Patient Name: Date of Service: Andrea [name] 09/14/2022 1:00 PM Medical Record Number: [id number] Patient Account Number: [id number] Date of Birth/Sex: Treating RN: September 04, 1940 (82 y.o. Katrinka Blazing Primary Care Errick Salts: Andrea [name] Other Clinician: Referring Leaf [employer]: Treating Ellena Kamen/Extender: Andrea [name] in Treatment: 42 Multidisciplinary Care Plan reviewed with physician Active Inactive Pressure Nursing Diagnoses: Knowledge deficit related to causes and risk factors for pressure ulcer development Knowledge deficit related to management of pressures ulcers Potential for impaired tissue integrity related to pressure, friction, moisture, and shear Goals: Patient/caregiver will verbalize understanding of pressure ulcer management Date Initiated: 11/24/2021 Target Resolution Date: 11/24/2022 Goal Status: Active Interventions: Assess: immobility, friction, shearing, incontinence upon admission and as needed Assess offloading mechanisms upon admission and as needed Assess potential for pressure ulcer upon admission and as needed Provide education on pressure ulcers Treatment Activities: Pressure reduction/relief device ordered : 11/24/2021 Notes: Wound/Skin Impairment Nursing Diagnoses: Impaired tissue integrity Knowledge deficit related to ulceration/compromised skin integrity Goals: Patient/caregiver will verbalize understanding of skin care regimen Date Initiated: 11/24/2021 Target Resolution Date: 11/24/2022 Goal Status: Active Ulcer/skin breakdown will have  [name] volume reduction of 30% by week 4 Date Initiated: 11/24/2021 Date Inactivated: 05/31/2022 Target Resolution Date: 05/25/2022 Unmet Reason: refuses VAC, Goal Status:  Unmet noncompliant with offloading Interventions: Assess patient/caregiver ability to obtain necessary supplies Assess patient/caregiver ability to perform ulcer/skin care regimen upon admission and as needed Assess ulceration(s) every visit Provide education on ulcer and skin care Treatment Activities: Skin care regimen initiated : 11/24/2021 Topical wound management initiated : 11/24/2021 Notes: Electronic Signature(s) Signed: 09/14/2022 4:27:52 PM By: Andrea Schwalbe RN Entered By: Andrea [name] on 09/14/2022 13:19:30 Andrea [name] ([id number]) [id number] Page 5 of 11 -------------------------------------------------------------------------------- Pain Assessment Details Patient Name: Date of Service: Andrea [name] 09/14/2022 1:00 PM Medical Record Number: [id number] Patient Account Number: [id number] Date of Birth/Sex: Treating RN: 05-04-1940 (82 y.o. F) Primary Care Inaki Vantine: Andrea [name] Other Clinician: Referring Nalia Honeycutt: Treating Illa Enlow/Extender: Andrea [name] in Treatment: 42 Active Problems Location of Pain Severity and Description of Pain Patient Has Paino Yes Site Locations Rate the pain. Current Pain Level: 5 Worst Pain Level: 10 Least Pain Level: 0 Tolerable Pain Level: 2 Character of Pain Describe the Pain: Other: sore Pain Management and Medication Current Pain Management: Electronic Signature(s) Signed: 09/14/2022 2:38:57 PM By: Andrea [name] Entered By: Andrea [name] on 09/14/2022 10:05:04 -------------------------------------------------------------------------------- Patient/Caregiver Education Details Patient Name: Date of Service: Andrea [name] 8/20/2024andnbsp1:00 PM Medical Record Number: [id number] Patient Account Number: [id number] Date of Birth/Gender: Treating RN: August 21, 1940 (82 y.o. Katrinka Blazing Primary Care Physician: Andrea [name] Other Clinician: Referring  Physician: Treating Physician/Extender: Andrea [name] in Treatment: 10 Education Assessment Education Provided To: Patient Education Topics Provided Wound/Skin ImpairmentZURRI, Andrea [name] ([id number]) [id number] Page 6 of 11 Methods: Explain/Verbal Responses: State content correctly Electronic Signature(s) Signed: 09/14/2022 4:27:52 PM By: Andrea Schwalbe RN Entered By: Andrea [name] on 09/14/2022 13:19:47 -------------------------------------------------------------------------------- Wound Assessment Details Patient Name: Date of Service: Andrea [name] 09/14/2022 1:00 PM Medical Record Number: [id number] Patient Account Number: [id number] Date of Birth/Sex: Treating RN: March 26, 1940 (82 y.o. Katrinka Blazing Primary Care Trudy Kory: Andrea [name] Other Clinician: Referring Tyrisha Benninger: Treating Sameer Teeple/Extender: Andrea [name] in Treatment: 42 Wound Status Wound Number: 1 Primary Pressure Ulcer Etiology: Wound Location: Right, Medial Malleolus Wound Open Wounding Event: Pressure Injury Status: Date Acquired: 11/16/2021 Comorbid Cataracts, Arrhythmia, Hypertension, Osteoarthritis, Weeks Of Treatment: 42 History: Osteomyelitis, Neuropathy, Confinement Anxiety Clustered Wound: No Photos Wound Measurements Length: (cm) 0.7 Width: (cm) 0.9 Depth: (cm) 0.1 Area: (cm) 0.495 Volume: (cm) 0.049 % Reduction in Area: 67.8% % Reduction in Volume: 68.2% Epithelialization: Small (1-33%) Wound Description Classification: Category/Stage III Wound Margin: Distinct, outline attached Exudate Amount: Medium Exudate Type: Serosanguineous Exudate Color: red, brown Foul Odor After Cleansing: No Slough/Fibrino Yes Wound Bed Granulation Amount: Small (1-33%) Exposed Structure Granulation Quality: Red Fascia Exposed: No Necrotic Amount: Large (67-100%) Fat Layer (Subcutaneous Tissue) Exposed:  Yes Necrotic Quality: Eschar, Adherent Slough Tendon Exposed: No Muscle Exposed: No Joint Exposed: No Bone Exposed: No Periwound Skin Texture Texture Color No Abnormalities Noted: Yes No Abnormalities Noted: No Andrea [name], Andrea [name] ([id number]) [id number] Page 7 of 11 Erythema: No Moisture No Abnormalities Noted: Yes Temperature / Pain Temperature: No Abnormality Treatment Notes Wound #1 (Malleolus) Wound Laterality: Right, Medial Cleanser Normal Saline Discharge Instruction: Cleanse the wound with Normal Saline prior to applying [name] clean dressing using gauze sponges, not tissue or cotton balls. Byram Ancillary Kit - 15 Day Supply Discharge Instruction: Use supplies as instructed; Kit contains: (15)  Saline Bullets; (15) 3x3 Gauze; 15 pr Gloves Peri-Wound Care Skin Prep Discharge Instruction: Use skin prep as directed Topical Primary Dressing Cutimed Sorbact Swab Discharge Instruction: Apply to wound bed as instructed Oasis tri-layer Secondary Dressing Woven Gauze Sponge, Non-Sterile 4x4 in Discharge Instruction: Apply over primary dressing as directed. Secured With [employer], 4.5x3.1 (in/yd) Discharge Instruction: Secure with Kerlix as directed. 46M Medipore Soft Cloth Surgical T 2x10 (in/yd) ape Discharge Instruction: Secure with tape as directed. Compression Wrap Compression Stockings Add-Ons Electronic Signature(s) Signed: 09/14/2022 4:27:52 PM By: Andrea Schwalbe RN Entered By: Andrea [name] on 09/14/2022 10:29:40 -------------------------------------------------------------------------------- Wound Assessment Details Patient Name: Date of Service: Andrea [name] 09/14/2022 1:00 PM Medical Record Number: [id number] Patient Account Number: [id number] Date of Birth/Sex: Treating RN: 09-May-1940 (82 y.o. Katrinka Blazing Primary Care Dameshia Seybold: Andrea [name] Other Clinician: Referring Anvitha Hutmacher: Treating Giancarlo Askren/Extender: Andrea [name] in Treatment: 42 Wound Status Wound Number: 2 Primary Abscess Etiology: Wound Location: Right Gluteus Wound Open Wounding Event: Bump Status: Date Acquired: 09/28/2021 Comorbid Cataracts, Arrhythmia, Hypertension, Osteoarthritis, Weeks Of Treatment: 42 History: Osteomyelitis, Neuropathy, Confinement Anxiety Clustered Wound: No Photos Andrea [name], Andrea [name] ([id number]) [id number] Page 8 of 11 Wound Measurements Length: (cm) 1 Width: (cm) 2 Depth: (cm) 3.1 Area: (cm) 1.571 Volume: (cm) 4.869 % Reduction in Area: 85% % Reduction in Volume: 84.5% Epithelialization: Small (1-33%) Tunneling: Yes Position (o'clock): 1 Maximum Distance: (cm) 4.4 Undermining: No Wound Description Classification: Full Thickness With Exposed Support Structures Wound Margin: Epibole Exudate Amount: Medium Exudate Type: Serous Exudate Color: amber Foul Odor After Cleansing: No Slough/Fibrino Yes Wound Bed Granulation Amount: Large (67-100%) Exposed Structure Granulation Quality: Red Fascia Exposed: No Necrotic Amount: Small (1-33%) Fat Layer (Subcutaneous Tissue) Exposed: Yes Necrotic Quality: Adherent Slough Tendon Exposed: No Muscle Exposed: No Joint Exposed: No Bone Exposed: No Periwound Skin Texture Texture Color No Abnormalities Noted: Yes No Abnormalities Noted: Yes Moisture Temperature / Pain No Abnormalities Noted: Yes Temperature: No Abnormality Tenderness on Palpation: Yes Treatment Notes Wound #2 (Gluteus) Wound Laterality: Right Cleanser Normal Saline Discharge Instruction: Cleanse the wound with Normal Saline prior to applying [name] clean dressing using gauze sponges, not tissue or cotton balls. Peri-Wound Care Skin Prep Discharge Instruction: Use skin prep as directed Topical Primary Dressing Dakin's Solution 0.25%, 16 (oz) Discharge Instruction: Moisten gauze with Dakin's solution Promogran Prisma Matrix, 4.34 (sq in)  (silver collagen) Discharge Instruction: Moisten collagen with saline or hydrogel cotton tipped applicators Discharge Instruction: use to pack wound Secondary Dressing Zetuvit Plus Silicone Border Dressing 5x5 (in/in) Discharge Instruction: Apply silicone border over primary dressing as directed. Secured With NOSHIN, DEGRAZIA ([id number]) [id number] Page 9 of 11 46M Medipore H Soft Cloth Surgical T ape, 4 x 10 (in/yd) Discharge Instruction: Secure with tape as directed. Compression Wrap Compression Stockings Add-Ons Electronic Signature(s) Signed: 09/14/2022 4:27:52 PM By: Andrea Schwalbe RN Entered By: Andrea [name] on 09/14/2022 10:27:03 -------------------------------------------------------------------------------- Wound Assessment Details Patient Name: Date of Service: Andrea [name] 09/14/2022 1:00 PM Medical Record Number: [id number] Patient Account Number: [id number] Date of Birth/Sex: Treating RN: [birth year] (82 y.o. Katrinka Blazing Primary Care Jadaya Sommerfield: Andrea [name] Other Clinician: Referring Mayrani Khamis: Treating Athanasia Stanwood/Extender: Andrea [name] in Treatment: 42 Wound Status Wound Number: 4 Primary Pressure Ulcer Etiology: Wound Location: Left, Lateral Ankle Wound Open Wounding Event: Pressure Injury Status: Date Acquired: 12/23/2021 Comorbid Cataracts, Arrhythmia, Hypertension, Osteoarthritis, Weeks Of Treatment: 36 History: Osteomyelitis, Neuropathy, Confinement Anxiety Clustered Wound: No Photos Wound Measurements Length: (  cm) 1 Width: (cm) 1 Depth: (cm) 0.3 Area: (cm) 0.785 Volume: (cm) 0.236 % Reduction in Area: -456.7% % Reduction in Volume: -1585.7% Epithelialization: None Tunneling: No Undermining: No Wound Description Classification: Category/Stage IV Wound Margin: Distinct, outline attached Exudate Amount: Medium Exudate Type: Serosanguineous Exudate Color: red, brown Foul Odor  After Cleansing: No Slough/Fibrino Yes Wound Bed Granulation Amount: Medium (34-66%) Exposed Structure Granulation Quality: Pink, Pale Fascia Exposed: No Necrotic Amount: Medium (34-66%) Fat Layer (Subcutaneous Tissue) Exposed: Yes Necrotic Quality: Adherent Slough Tendon Exposed: Yes Muscle Exposed: No Joint Exposed: No Bone Exposed: No Andrea [name], Andrea [name] ([id number]) [id number] Page 10 of 11 Periwound Skin Texture Texture Color No Abnormalities Noted: Yes No Abnormalities Noted: Yes Moisture Temperature / Pain No Abnormalities Noted: Yes Temperature: No Abnormality Tenderness on Palpation: Yes Treatment Notes Wound #4 (Ankle) Wound Laterality: Left, Lateral Cleanser Normal Saline Discharge Instruction: Cleanse the wound with Normal Saline prior to applying [name] clean dressing using gauze sponges, not tissue or cotton balls. Byram Ancillary Kit - 15 Day Supply Discharge Instruction: Use supplies as instructed; Kit contains: (15) Saline Bullets; (15) 3x3 Gauze; 15 pr Gloves Peri-Wound Care Skin Prep Discharge Instruction: Use skin prep as directed Topical Primary Dressing Cutimed Sorbact Swab Discharge Instruction: Apply to wound bed as instructed Oasis tri-layer Secondary Dressing Drawtex 4x4 in Discharge Instruction: Apply over primary dressing as directed. Woven Gauze Sponge, Non-Sterile 4x4 in Discharge Instruction: Apply over primary dressing as directed. Secured With [employer], 4.5x3.1 (in/yd) Discharge Instruction: Secure with Kerlix as directed. 23M Medipore Soft Cloth Surgical T 2x10 (in/yd) ape Discharge Instruction: Secure with tape as directed. Compression Wrap Compression Stockings Add-Ons Electronic Signature(s) Signed: 09/14/2022 4:27:52 PM By: Andrea Schwalbe RN Entered By: Andrea [name] on 09/14/2022 10:30:13 -------------------------------------------------------------------------------- Vitals Details Patient  Name: Date of Service: Andrea [name] 09/14/2022 1:00 PM Medical Record Number: [id number] Patient Account Number: [id number] Date of Birth/Sex: Treating RN: [birth year] (82 y.o. F) Primary Care Padraic Marinos: Andrea [name] Other Clinician: Referring Dezirae Service: Treating Sharonda Llamas/Extender: Andrea [name] in Treatment: 42 Vital Signs Time Taken: 01:04 Temperature (F): 98.6 Height (in): 59 Pulse (bpm): 76 Weight (lbs): 155 Respiratory Rate (breaths/min): 18 Body Mass Index (BMI): 31.3 Blood Pressure (mmHg): 134/71 Reference Range: 80 - 120 mg / dl Andrea [name], Andrea [name] ([id number]) [id number] Page 11 of 11 Electronic Signature(s) Signed: 09/14/2022 2:38:57 PM By: Andrea [name] Entered By: Andrea [name] on 09/14/2022 10:04:27

## 2022-09-14 NOTE — Progress Notes (Signed)
LEAN, LOOSLI A ([id number]) [id number] Page 1 of 15 Visit Report for 09/14/2022 Chief Complaint Document Details Patient Name: Date of Service: Andrea [name] 09/14/2022 1:00 PM Medical Record Number: [id number] Patient Account Number: [id number] Date of Birth/Sex: Treating RN: 26-May-1940 (82 y.o. F) Primary Care Provider: Jarome Matin Other Clinician: Referring Provider: Treating Provider/Extender: Marena Chancy in Treatment: 42 Information Obtained from: Patient Chief Complaint Patient is at the clinic for treatment of an open pressure ulcer on her right medial ankle, and a large abscess on her right buttock Electronic Signature(s) Signed: 09/14/2022 3:00:35 PM By: Duanne Guess MD FACS Entered By: Duanne Guess on 09/14/2022 12:00:35 -------------------------------------------------------------------------------- Cellular or Tissue Based Product Details Patient Name: Date of Service: Andrea [name] 09/14/2022 1:00 PM Medical Record Number: [id number] Patient Account Number: [id number] Date of Birth/Sex: Treating RN: 08-04-1940 (82 y.o. F) Primary Care Provider: Jarome Matin Other Clinician: Referring Provider: Treating Provider/Extender: Marena Chancy in Treatment: 42 Cellular or Tissue Based Product Type Wound #4 Left,Lateral Ankle Applied to: Performed By: Physician Duanne Guess, MD Cellular or Tissue Based Product Type: Oasis tri-layer Level of Consciousness (Pre-procedure): Awake and Alert Pre-procedure Verification/Time Out Yes - 13:45 Taken: Location: trunk / arms / legs Wound Size (sq cm): 1 Product Size (sq cm): 5.5 Waste Size (sq cm): 2 Waste Reason: size of wound Amount of Product Applied (sq cm): 3.5 Instrument Used: Forceps, Scissors Lot #: [id number] Order #: 9 Expiration Date: 05/09/2024 Fenestrated: No Reconstituted: Yes Solution Type: Normal  saline Solution Amount: 4 ml Lot #: [id number] KS Solution Expiration Date: 02/16/2024 Secured: Yes Secured With: Steri-Strips Dressing Applied: Yes Primary Dressing: Sorbact gauze Procedural Pain: 0 Post Procedural Pain: 0 Response to Treatment: Procedure was tolerated well JAZZALYNN, MILER A ([id number]) [id number] Page 2 of 15 Level of Consciousness (Post- Awake and Alert procedure): Post Procedure Diagnosis Same as Pre-procedure Notes Scribed for Dr. Lady Gary by J.Scotton Electronic Signature(s) Signed: 09/14/2022 3:00:15 PM By: Duanne Guess MD FACS Entered By: Duanne Guess on 09/14/2022 12:00:15 -------------------------------------------------------------------------------- Cellular or Tissue Based Product Details Patient Name: Date of Service: Andrea [name] 09/14/2022 1:00 PM Medical Record Number: [id number] Patient Account Number: [id number] Date of Birth/Sex: Treating RN: 1940/12/16 (81 y.o. F) Primary Care Provider: Jarome Matin Other Clinician: Referring Provider: Treating Provider/Extender: Marena Chancy in Treatment: 42 Cellular or Tissue Based Product Type Wound #1 Right,Medial Malleolus Applied to: Performed By: Physician Duanne Guess, MD Cellular or Tissue Based Product Type: Oasis tri-layer Level of Consciousness (Pre-procedure): Awake and Alert Pre-procedure Verification/Time Out Yes - 13:45 Taken: Location: trunk / arms / legs Wound Size (sq cm): 0.63 Product Size (sq cm): 5.5 Waste Size (sq cm): 3 Waste Reason: size of wound Amount of Product Applied (sq cm): 2.5 Instrument Used: Forceps, Scissors Lot #: [id number] Order #: 9 Expiration Date: 05/09/2024 Fenestrated: No Reconstituted: Yes Solution Type: Normal saline Solution Amount: 4 ml Lot #: [id number] KS Solution Expiration Date: 02/16/2024 Secured: Yes Secured With: Steri-Strips Dressing Applied: Yes Primary Dressing: Sorbact  gauze Procedural Pain: 0 Post Procedural Pain: 0 Response to Treatment: Procedure was tolerated well Level of Consciousness (Post- Awake and Alert procedure): Post Procedure Diagnosis Same as Pre-procedure Notes Scribed for Dr. Lady Gary by J.Scotton Electronic Signature(s) Signed: 09/14/2022 3:00:27 PM By: Duanne Guess MD FACS Entered By: Duanne Guess on 09/14/2022 12:00:27 [name] ([id number]) [id number] Page 3 of 15 -------------------------------------------------------------------------------- Debridement Details Patient Name: Date of Service: [name], [name]  09/14/2022 1:00 PM Medical Record Number: [id number] Patient Account Number: [id number] Date of Birth/Sex: Treating RN: 1940-02-15 (82 y.o. Andrea [name] Primary Care Provider: Jarome Matin Other Clinician: Referring Provider: Treating Provider/Extender: Marena Chancy in Treatment: 42 Debridement Performed for Assessment: Wound #4 Left,Lateral Ankle Performed By: Physician Duanne Guess, MD Debridement Type: Debridement Level of Consciousness (Pre-procedure): Awake and Alert Pre-procedure Verification/Time Out Yes - 13:45 Taken: Start Time: 13:45 Pain Control: Lidocaine 4% Topical Solution Percent of Wound Bed Debrided: 100% T Area Debrided (cm): otal 0.78 Tissue and other material debrided: Non-Viable, Eschar, Slough, Slough Level: Non-Viable Tissue Debridement Description: Selective/Open Wound Instrument: Curette Bleeding: Minimum Hemostasis Achieved: Pressure End Time: 13:47 Procedural Pain: 0 Post Procedural Pain: 0 Response to Treatment: Procedure was tolerated well Level of Consciousness (Post- Awake and Alert procedure): Post Debridement Measurements of Total Wound Length: (cm) 1 Stage: Category/Stage IV Width: (cm) 1 Depth: (cm) 0.3 Volume: (cm) 0.236 Character of Wound/Ulcer Post Debridement: Improved Post Procedure  Diagnosis Same as Pre-procedure Notes Scribed for Dr. Lady Gary by J.Scotton Electronic Signature(s) Signed: 09/14/2022 4:17:12 PM By: Duanne Guess MD FACS Signed: 09/14/2022 4:27:52 PM By: Karie Schwalbe RN Entered By: Karie Schwalbe on 09/14/2022 11:06:50 -------------------------------------------------------------------------------- Debridement Details Patient Name: Date of Service: [name] 09/14/2022 1:00 PM Medical Record Number: [id number] Patient Account Number: [id number] Date of Birth/Sex: Treating RN: [birth year] (81 y.o. Andrea [name] Primary Care Provider: Jarome Matin Other Clinician: Referring Provider: Treating Provider/Extender: Marena Chancy in Treatment: 42 Debridement Performed for Assessment: Wound #1 Right,Medial Malleolus Performed By: Physician Duanne Guess, MD Debridement Type: Debridement Level of Consciousness (Pre-procedure): Awake and Alert Pre-procedure Verification/Time Out MONTIA, PRUE A ([id number]) [id number] Page 4 of 15 Pre-procedure Verification/Time Out Yes - 13:45 Taken: Start Time: 13:45 Pain Control: Lidocaine 4% Topical Solution Percent of Wound Bed Debrided: 100% T Area Debrided (cm): otal 0.49 Tissue and other material debrided: Non-Viable, Eschar, Slough, Slough Level: Non-Viable Tissue Debridement Description: Selective/Open Wound Instrument: Curette Bleeding: Minimum Hemostasis Achieved: Pressure End Time: 13:47 Procedural Pain: 0 Post Procedural Pain: 0 Response to Treatment: Procedure was tolerated well Level of Consciousness (Post- Awake and Alert procedure): Post Debridement Measurements of Total Wound Length: (cm) 0.7 Stage: Category/Stage III Width: (cm) 0.9 Depth: (cm) 0.1 Volume: (cm) 0.049 Character of Wound/Ulcer Post Debridement: Improved Post Procedure Diagnosis Same as Pre-procedure Notes Scribed for Dr. Lady Gary by  J.Scotton Electronic Signature(s) Signed: 09/14/2022 4:17:12 PM By: Duanne Guess MD FACS Signed: 09/14/2022 4:27:52 PM By: Karie Schwalbe RN Entered By: Karie Schwalbe on 09/14/2022 11:07:34 -------------------------------------------------------------------------------- HPI Details Patient Name: Date of Service: [name] 09/14/2022 1:00 PM Medical Record Number: [id number] Patient Account Number: [id number] Date of Birth/Sex: Treating RN: 1940/04/08 (81 y.o. 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 She is not diabetic and quit smoking over 40 years ago. 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 She has developed a pressure ulcer on the right medial malleolus. In addition, she apparently has had multiple cutaneous abscesses on her buttocks that have required repeated incision and drainage procedures. She has developed a large abscess on her right buttock that has become painful. She was recently treated with cephalexin by  her PCP for urinary tract infection and also received a shot of Rocephin for the abscess, but it has not yet been incised or drained. She is nonambulatory but is able to stand to transfer. She does not wear any sort of Prevalon boot. ABI in clinic today was 0.96. On her right medial malleolus, there is a circular ulcer. The surface is dry and fibrotic. There is some periwound erythema, but no malodor or purulent drainage. On her right buttock, there is a large fluctuant bulge about the size of an orange. It is also erythematous and tender. 12/04/2021: The culture from her abscess grew out staph. A  10-day course of Bactrim was prescribed and she is currently taking this. Unfortunately, Dakin's solution was not delivered and only 2 x 2 gauze was sent, rather than Kerlix so the packing of the wound has been rather suboptimal. The wound cavity has light slough over all of the surfaces. Although covered, bone is palpable. The medial ankle wound looks about the same with a layer of slough accumulation. In addition, her daughter peeled some dry skin off of her foot this morning and she now has denuded areas over the majority of the plantar surface of her right foot. 01/01/2022: The patient has missed several visits and to her daughters report that it is somewhat difficult to get her here on a weekly basis. In the interim, she has developed a new pressure ulcer on her left lateral malleolus. The plantar surface of her right foot has healed. The cavity on her right buttock has contracted, but it remains quite deep and the trochanter, while not exposed, is palpable at the base. The pressure ulcer on her right medial malleolus is basically unchanged. ELLERIE, PFAU A ([id number]) [id number] Page 5 of 15 02/09/2022: All wounds are little bit smaller today. As the buttock abscess cavity has contracted, it has left some tunneling that has not been adequately packed. Light slough accumulation on both ankle wounds. 02/26/2022: All of the wounds continue to contract. There is slough on both ankle wounds. The buttock ulcer is clean. She brought her wound VAC with her today. 03/12/2022: The ankle wounds are about the same this week with slough accumulation. The buttock ulcer is smaller and quite clean. She decided that she could not tolerate the discomfort of the wound VAC and removed it. They have been packing the wound with Dakin's moistened gauze. 03/25/2022: The ankle wounds are slightly smaller. Both have slough accumulation. The orifice of the buttock ulcer has gotten quite a bit smaller  than the underlying cavity. She has decided that she would like to have the wound VAC back. 3/14; patient presents for follow-up. She has bilateral ankle wounds and has been using Hydrofera Blue to the these wound beds. She has Prevalon boots to help with offloading. She also has a sacral wound that we have been using a wound VAC for. She has no issues or complaints today. 04/29/2022: She once again decided she did not like the wound VAC and so they have been packing her gluteal wound with Dakin's moistened gauze. The gluteal ulcer seems to be about the same. Her left lateral ankle wound has gotten deeper and larger. The surface tissue is gray with thick slough. No significant change to the right medial ankle wound. 05/18/2022: The depth on the gluteal ulcer has come in a bit. There was a very strong pungent odor when the packing was removed, however. The right medial ankle wound is about the same size, but  the tissue surface looks healthier. The left lateral ankle wound has reaccumulated the rubbery gray slough. 05/31/2022: The gluteal ulcer is shallower and no longer has any dips or crevices. The odor has abated completely. The right medial ankle wound is unchanged. The left lateral ankle wound looks quite a bit healthier with a cleaner surface and the beginnings of granulation tissue emergence. 06/16/2022: The gluteal ulcer has the same measurements more or less, but overall the volume seems smaller, as the cavity feels tighter on examination. The ankle wounds are basically the same. 07/12/2022: The cavity of the buttock ulcer continues to contract. The left ankle wound has a little bit more granulation tissue, but bone remains exposed. The right ankle wound is flush with the surrounding skin. 07/21/2022: There is 1 deeper area in the buttock ulcer that I have not appreciated previously. I can feel bone under a layer of tissue. The rest of the cavity has contracted. Both ankle wounds are filling in with  better granulation tissue. There is slough on both ankle wound surfaces. She struck her leg on her bed frame and has 2 new wounds on her right anterior tibial surface. Both have fat layer exposure but no concern for infection. 08/02/2022: I do not feel bone as easily in the buttock ulcer. There is slough on the surface. The cavity seems to be contracting. Both ankle wounds have improved tissue quality and there is granulation tissue nearly completely covering the bone on the left lateral malleolus. The wounds on her right anterior tibial surface have dried up. 08/10/2022: The cavity at the buttock ulcer has contracted further. The depth remains about the same. The surface is cleaner this week. Both ankle wounds are little bit smaller today. They have rubbery slough on the surface which appears to be leftover Oasis. Underneath, the quality of the tissue continues to improve and there is no longer any bone exposed on the left lateral malleolus. 08/16/2022: The buttock ulcer cavity has contracted further and she has less tenderness at the deepest aspect of the wound. Both ankle wounds look smaller today, although they did not measure any differently. Minimal slough accumulation with fairly healthy-looking granulation tissue present. 08/24/2022: The buttock ulcer cavity is smaller again today. She no longer has the tenderness at the deepest aspect of the wound. Both ankle wounds measured about a millimeter smaller. There is healthy-looking granulation tissue present bilaterally with minimal slough on the left and no slough seen on the right. 08/31/2022: The cavity of the buttock ulcer continues to contract. It is about half a centimeter shallower than last week and there is no undermining. The cavity is also narrower and is more difficult to get an index finger into the wound. Both ankle wounds measured slightly smaller. Both have a rather dry skin edge but good granulation tissue present with minimal  slough. 09/08/2022: The buttock ulcer cavity is smaller again today. It is shallower and narrower. Both ankle wounds were a little bit smaller, as well. There is a fair amount of slough on the left lateral ankle wound. 09/14/2022: Unfortunately, the buttock ulcer cavity measured a little larger and a little deeper today. The patient did say she has been sitting a bit more of late. The right medial ankle wound is about the same size; the left lateral ankle wound is a little smaller and shallower. There is an area on the medial aspect of her right calcaneus that looks concerning for potential pressure-induced deep tissue injury. Electronic Signature(s) Signed: 09/14/2022 3:01:34 PM  By: Duanne Guess MD FACS Entered By: Duanne Guess on 09/14/2022 12:01:34 -------------------------------------------------------------------------------- Physical Exam Details Patient Name: Date of Service: Andrea [name] 09/14/2022 1:00 PM Medical Record Number: [id number] Patient Account Number: [id number] Date of Birth/Sex: Treating RN: 10-Oct-1940 (82 y.o. F) Primary Care Provider: Jarome Matin Other Clinician: Referring Provider: Treating Provider/Extender: Marena Chancy in Treatment: 42 Constitutional . . . . no acute distress. Respiratory ABIGAILE, BRONKHORST A ([id number]) [id number] Page 6 of 15 Normal work of breathing on room air. Notes 09/14/2022: Unfortunately, the buttock ulcer cavity measured a little larger and a little deeper today. The patient did say she has been sitting a bit more of late. The right medial ankle wound is about the same size; the left lateral ankle wound is a little smaller and shallower. There is an area on the medial aspect of her right calcaneus that looks concerning for potential pressure-induced deep tissue injury. Electronic Signature(s) Signed: 09/14/2022 3:03:22 PM By: Duanne Guess MD FACS Entered By: Duanne Guess on 09/14/2022 12:03:22 -------------------------------------------------------------------------------- Physician Orders Details Patient Name: Date of Service: Andrea [name] 09/14/2022 1:00 PM Medical Record Number: [id number] Patient Account Number: [id number] Date of Birth/Sex: Treating RN: January 09, 1941 (81 y.o. 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 - Dr. Lady Gary - Room 1 Return A Anesthetic (In clinic) Topical Lidocaine 4% applied to wound bed Cellular or Tissue Based Products Wound #1 Right,Medial Malleolus Cellular or Tissue Based Product Type: - oasis tri-layer #9 Cellular or Tissue Based Product applied to wound bed, secured with steri-strips, cover with Adaptic or Mepitel. (DO NOT REMOVE). Wound #4 Left,Lateral Ankle Cellular or Tissue Based Product Type: - oasis tri-layer #9 Cellular or Tissue Based Product applied to wound bed, secured with steri-strips, cover with Adaptic or Mepitel. (DO NOT REMOVE). Bathing/ Shower/ Hygiene May shower with protection but do not get wound dressing(s) wet. Protect dressing(s) with water repellant cover (for example, large plastic bag) or a cast cover and may then take shower. Off-Loading Multipodus Splint to: - prevalon boot to both feet Turn and reposition every 2 hours Home Health No change in wound care  orders this week; continue Home Health for wound care. May utilize formulary equivalent dressing for wound treatment orders unless otherwise specified. - may chage outer dressing only on ankles, DO NOT remove sterostrips Dressing changes to be completed by Home Health on Monday / Wednesday / Friday except when patient has scheduled visit at [facility]. Other Home Health Orders/Instructions: - [facility] Wound Treatment Wound #1 - Malleolus Wound Laterality: Right, Medial Cleanser: Normal Saline (Generic) 1 x Per Week/30 Days Discharge Instructions: Cleanse the wound with Normal Saline prior to applying a clean dressing using gauze sponges, not tissue or cotton balls. TERRENA, GARCED A ([id number]) [id number] Page 7 of 15 Cleanser: Byram Ancillary Kit - 15 Day Supply (Generic) 1 x Per Week/30 Days Discharge Instructions: Use supplies as instructed; Kit contains: (15) Saline Bullets; (15) 3x3 Gauze; 15 pr Gloves Peri-Wound Care: Skin Prep (Generic)  1 x Per Week/30 Days Discharge Instructions: Use skin prep as directed Prim Dressing: Cutimed Sorbact Swab 1 x Per Week/30 Days ary Discharge Instructions: Apply to wound bed as instructed Prim Dressing: Oasis tri-layer ary 1 x Per Week/30 Days Secondary Dressing: Woven Gauze Sponge, Non-Sterile 4x4 in 1 x Per Week/30 Days Discharge Instructions: Apply over primary dressing as directed. Secured With: [employer], 4.5x3.1 (in/yd) 1 x Per Week/30 Days Discharge Instructions: Secure with Kerlix as directed. Secured With: 29M Medipore Scientist, research (life sciences) Surgical T 2x10 (in/yd) (Generic) 1 x Per Week/30 Days ape Discharge Instructions: Secure with tape as directed. Wound #2 - Gluteus Wound Laterality: Right Cleanser: Normal Saline (Generic) 1 x Per Day/30 Days Discharge Instructions: Cleanse the wound with Normal Saline prior to applying a clean dressing using gauze sponges, not tissue or cotton balls. Peri-Wound Care:  Skin Prep (Generic) 1 x Per Day/30 Days Discharge Instructions: Use skin prep as directed Prim Dressing: Dakin's Solution 0.25%, 16 (oz) (Generic) 1 x Per Day/30 Days ary Discharge Instructions: Moisten gauze with Dakin's solution Prim Dressing: Promogran Prisma Matrix, 4.34 (sq in) (silver collagen) (Dispense As Written) 1 x Per Day/30 Days ary Discharge Instructions: Moisten collagen with saline or hydrogel Prim Dressing: cotton tipped applicators (Generic) 1 x Per Day/30 Days ary Discharge Instructions: use to pack wound Secondary Dressing: Zetuvit Plus Silicone Border Dressing 5x5 (in/in) (Generic) 1 x Per Day/30 Days Discharge Instructions: Apply silicone border over primary dressing as directed. Secured With: 29M Medipore H Soft Cloth Surgical T ape, 4 x 10 (in/yd) (Generic) 1 x Per Day/30 Days Discharge Instructions: Secure with tape as directed. Wound #4 - Ankle Wound Laterality: Left, Lateral Cleanser: Normal Saline (Generic) 1 x Per Week/30 Days Discharge Instructions: Cleanse the wound with Normal Saline prior to applying a clean dressing using gauze sponges, not tissue or cotton balls. Cleanser: Byram Ancillary Kit - 15 Day Supply (Generic) 1 x Per Week/30 Days Discharge Instructions: Use supplies as instructed; Kit contains: (15) Saline Bullets; (15) 3x3 Gauze; 15 pr Gloves Peri-Wound Care: Skin Prep (Generic) 1 x Per Week/30 Days Discharge Instructions: Use skin prep as directed Prim Dressing: Cutimed Sorbact Swab 1 x Per Week/30 Days ary Discharge Instructions: Apply to wound bed as instructed Prim Dressing: Oasis tri-layer ary 1 x Per Week/30 Days Secondary Dressing: Drawtex 4x4 in 1 x Per Week/30 Days Discharge Instructions: Apply over primary dressing as directed. Secondary Dressing: Woven Gauze Sponge, Non-Sterile 4x4 in 1 x Per Week/30 Days Discharge Instructions: Apply over primary dressing as directed. Secured With: [employer], 4.5x3.1 (in/yd) 1 x Per  Week/30 Days Discharge Instructions: Secure with Kerlix as directed. Secured With: 29M Medipore Scientist, research (life sciences) Surgical T 2x10 (in/yd) (Generic) 1 x Per Week/30 Days ape Discharge Instructions: Secure with tape as directed. Electronic Signature(s) Signed: 09/14/2022 4:17:12 PM By: Duanne Guess MD FACS Entered By: Duanne Guess on 09/14/2022 12:03:52 [name] ([id number]) [id number] Page 8 of 15 -------------------------------------------------------------------------------- Problem List Details Patient Name: Date of Service: Andrea [name] 09/14/2022 1:00 PM Medical Record Number: [id number] Patient Account Number: [id number] Date of Birth/Sex: Treating RN: 01-05-1941 (82 y.o. 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 without  01/01/2022 No Yes evidence of necrosis L02.31 Cutaneous abscess of buttock 11/24/2021 No Yes E27.40 Unspecified adrenocortical insufficiency 11/24/2021 No Yes Z79.52 Long term (current) use of systemic steroids 11/24/2021 No Yes I10 Essential (primary) hypertension 11/24/2021 No Yes Inactive Problems ICD-10 Code Description Active Date Inactive Date L97.812 Non-pressure chronic ulcer of other part of right lower leg with fat layer exposed 07/21/2022 07/21/2022 Resolved Problems ICD-10 Code Description Active Date Resolved Date L97.511 Non-pressure chronic ulcer of other part of right foot limited to breakdown of skin 12/04/2021 12/04/2021 Electronic Signature(s) Signed: 09/14/2022 2:59:19 PM By: Duanne Guess MD FACS Entered  By: Duanne Guess on 09/14/2022 11:59:18 [name] ([id number]) [id number] Page 9 of 15 -------------------------------------------------------------------------------- Progress Note Details Patient Name: Date of Service: Andrea [name] 09/14/2022 1:00 PM Medical Record Number: [id number] Patient Account Number: [id number] Date of Birth/Sex: Treating RN: [id number] (82 y.o. 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 She is not diabetic and quit smoking over 40 years ago. 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 She has developed a pressure ulcer on the right medial malleolus. In addition, she apparently has had multiple cutaneous abscesses on her buttocks that have required repeated incision and drainage procedures. She has developed a large abscess on her right buttock that has become painful. She was recently treated with cephalexin by her PCP for urinary tract infection and also received a shot of Rocephin for the abscess, but it has not yet been incised or drained. She is nonambulatory but is able to stand to transfer. She does not wear any sort of Prevalon boot. ABI in clinic today was 0.96. On her right medial malleolus, there is a circular ulcer. The surface is dry and fibrotic. There is some periwound erythema, but no malodor or purulent drainage. On her right buttock, there is a large fluctuant bulge about  the size of an orange. It is also erythematous and tender. 12/04/2021: The culture from her abscess grew out staph. A 10-day course of Bactrim was prescribed and she is currently taking this. Unfortunately, Dakin's solution was not delivered and only 2 x 2 gauze was sent, rather than Kerlix so the packing of the wound has been rather suboptimal. The wound cavity has light slough over all of the surfaces. Although covered, bone is palpable. The medial ankle wound looks about the same with a layer of slough accumulation. In addition, her daughter peeled some dry skin off of her foot this morning and she now has denuded areas over the majority of the plantar surface of her right foot. 01/01/2022: The patient has missed several visits and to her daughters report that it is somewhat difficult to get her here on a weekly basis. In the interim, she has developed a new pressure ulcer on her left lateral malleolus. The plantar surface of her right foot has healed. The cavity on her right buttock has contracted, but it remains quite deep and the trochanter, while not exposed, is palpable at the base. The pressure ulcer on her right medial  malleolus is basically unchanged. 02/09/2022: All wounds are little bit smaller today. As the buttock abscess cavity has contracted, it has left some tunneling that has not been adequately packed. Light slough accumulation on both ankle wounds. 02/26/2022: All of the wounds continue to contract. There is slough on both ankle wounds. The buttock ulcer is clean. She brought her wound VAC with her today. 03/12/2022: The ankle wounds are about the same this week with slough accumulation. The buttock ulcer is smaller and quite clean. She decided that she could not tolerate the discomfort of the wound VAC and removed it. They have been packing the wound with Dakin's moistened gauze. 03/25/2022: The ankle wounds are slightly smaller. Both have slough accumulation. The orifice of the  buttock ulcer has gotten quite a bit smaller than the underlying cavity. She has decided that she would like to have the wound VAC back. 3/14; patient presents for follow-up. She has bilateral ankle wounds and has been using Hydrofera Blue to the these wound beds. She has Prevalon boots to help with offloading. She also has a sacral wound that we have been using a wound VAC for. She has no issues or complaints today. 04/29/2022: She once again decided she did not like the wound VAC and so they have been packing her gluteal wound with Dakin's moistened gauze. The gluteal ulcer seems to be about the same. Her left lateral ankle wound has gotten deeper and larger. The surface tissue is gray with thick slough. No significant change to the right medial ankle wound. 05/18/2022: The depth on the gluteal ulcer has come in a bit. There was a very strong pungent odor when the packing was removed, however. The right medial ankle wound is about the same size, but the tissue surface looks healthier. The left lateral ankle wound has reaccumulated the rubbery gray slough. 05/31/2022: The gluteal ulcer is shallower and no longer has any dips or crevices. The odor has abated completely. The right medial ankle wound is unchanged. The left lateral ankle wound looks quite a bit healthier with a cleaner surface and the beginnings of granulation tissue emergence. 06/16/2022: The gluteal ulcer has the same measurements more or less, but overall the volume seems smaller, as the cavity feels tighter on examination. The ankle wounds are basically the same. 07/12/2022: The cavity of the buttock ulcer continues to contract. The left ankle wound has a little bit more granulation tissue, but bone remains exposed. The right ankle wound is flush with the surrounding skin. 07/21/2022: There is 1 deeper area in the buttock ulcer that I have not appreciated previously. I can feel bone under a layer of tissue. The rest of the cavity  has contracted. Both ankle wounds are filling in with better granulation tissue. There is slough on both ankle wound surfaces. She struck her leg on her bed frame and has 2 new wounds on her right anterior tibial surface. Both have fat layer exposure but no concern for infection. 08/02/2022: I do not feel bone as easily in the buttock ulcer. There is slough on the surface. The cavity seems to be contracting. Both ankle wounds have improved tissue quality and there is granulation tissue nearly completely covering the bone on the left lateral malleolus. The wounds on her right anterior tibial surface have dried up. 08/10/2022: The cavity at the buttock ulcer has contracted further. The depth remains about the same. The surface is cleaner this week. Both ankle wounds are little bit smaller today. They have rubbery  slough on the surface which appears to be leftover Oasis. Underneath, the quality of the tissue continues to improve and there is no longer any bone exposed on the left lateral malleolus. KASHAY, SWIGERT A ([id number]) [id number] Page 10 of 15 08/16/2022: The buttock ulcer cavity has contracted further and she has less tenderness at the deepest aspect of the wound. Both ankle wounds look smaller today, although they did not measure any differently. Minimal slough accumulation with fairly healthy-looking granulation tissue present. 08/24/2022: The buttock ulcer cavity is smaller again today. She no longer has the tenderness at the deepest aspect of the wound. Both ankle wounds measured about a millimeter smaller. There is healthy-looking granulation tissue present bilaterally with minimal slough on the left and no slough seen on the right. 08/31/2022: The cavity of the buttock ulcer continues to contract. It is about half a centimeter shallower than last week and there is no undermining. The cavity is also narrower and is more difficult to get an index finger into the wound.  Both ankle wounds measured slightly smaller. Both have a rather dry skin edge but good granulation tissue present with minimal slough. 09/08/2022: The buttock ulcer cavity is smaller again today. It is shallower and narrower. Both ankle wounds were a little bit smaller, as well. There is a fair amount of slough on the left lateral ankle wound. 09/14/2022: Unfortunately, the buttock ulcer cavity measured a little larger and a little deeper today. The patient did say she has been sitting a bit more of late. The right medial ankle wound is about the same size; the left lateral ankle wound is a little smaller and shallower. There is an area on the medial aspect of her right calcaneus that looks concerning for potential pressure-induced deep tissue injury. Patient History Information obtained from Patient, Caregiver, Chart. Family History Cancer - Father,Maternal Grandparents,Siblings,Child, Heart Disease - Father, Hypertension - Father, Thyroid Problems - Mother, No family history of Diabetes, Hereditary Spherocytosis, Kidney Disease, Lung Disease, Seizures, Stroke, Tuberculosis. Social History Former smoker - quit 40 yr ago, Marital Status - Widowed, Alcohol Use - Never, Drug Use - No History, Caffeine Use - Daily - tea, soda. Medical History Eyes Patient has history of Cataracts - bil extracted Denies history of Glaucoma, Optic Neuritis Cardiovascular Patient has history of Arrhythmia - afib, Hypertension Endocrine Denies history of Type I Diabetes, Type II Diabetes Genitourinary Denies history of End Stage Renal Disease Integumentary (Skin) Denies history of History of Burn Musculoskeletal Patient has history of Osteoarthritis, Osteomyelitis - pelvic Neurologic Patient has history of Neuropathy Oncologic Denies history of Received Chemotherapy, Received Radiation Psychiatric Patient has history of Confinement Anxiety Denies history of Anorexia/bulimia Hospitalization/Surgery History -  TEE. - right ankle fusion. - bil knee replacements. - bil cataract extractions. - posterior lumbar fusion. - vaginal hysterectomy. - cholecystectomy. 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 Vitals Time Taken: 1:04 AM, Height: 59 in, Weight: 155 lbs, BMI: 31.3, Temperature: 98.6 F, Pulse: 76 bpm, Respiratory Rate: 18 breaths/min, Blood Pressure: 134/71 mmHg. Respiratory Normal work of breathing on room air. General Notes: 09/14/2022: Unfortunately, the buttock ulcer cavity measured a little larger and a little deeper today. The patient did say she has been sitting a CORBY, YANDYKE A ([id number]) [id number] Page 11 of 15 bit more of late. The right medial  ankle wound is about the same size; the left lateral ankle wound is a little smaller and shallower. There is an area on the medial aspect of her right calcaneus that looks concerning for potential pressure-induced deep tissue injury. Integumentary (Hair, Skin) Wound #1 status is Open. Original cause of wound was Pressure Injury. The date acquired was: 11/16/2021. The wound has been in treatment 42 weeks. The wound is located on the Right,Medial Malleolus. The wound measures 0.7cm length x 0.9cm width x 0.1cm depth; 0.495cm^2 area and 0.049cm^3 volume. There is Fat Layer (Subcutaneous Tissue) exposed. There is a medium amount of serosanguineous drainage noted. The wound margin is distinct with the outline attached to the wound base. There is small (1-33%) red granulation within the wound bed. There is a large (67-100%) amount of necrotic tissue within the wound bed including Eschar and Adherent Slough. The periwound skin  appearance had no abnormalities noted for texture. The periwound skin appearance had no abnormalities noted for moisture. The periwound skin appearance did not exhibit: Erythema. Periwound temperature was noted as No Abnormality. Wound #2 status is Open. Original cause of wound was Bump. The date acquired was: 09/28/2021. The wound has been in treatment 42 weeks. The wound is located on the Right Gluteus. The wound measures 1cm length x 2cm width x 3.1cm depth; 1.571cm^2 area and 4.869cm^3 volume. There is Fat Layer (Subcutaneous Tissue) exposed. There is no undermining noted, however, there is tunneling at 1:00 with a maximum distance of 4.4cm. There is a medium amount of serous drainage noted. The wound margin is epibole. There is large (67-100%) red granulation within the wound bed. There is a small (1-33%) amount of necrotic tissue within the wound bed including Adherent Slough. The periwound skin appearance had no abnormalities noted for texture. The periwound skin appearance had no abnormalities noted for moisture. The periwound skin appearance had no abnormalities noted for color. Periwound temperature was noted as No Abnormality. The periwound has tenderness on palpation. Wound #4 status is Open. Original cause of wound was Pressure Injury. The date acquired was: 12/23/2021. The wound has been in treatment 36 weeks. The wound is located on the Left,Lateral Ankle. The wound measures 1cm length x 1cm width x 0.3cm depth; 0.785cm^2 area and 0.236cm^3 volume. There is tendon and Fat Layer (Subcutaneous Tissue) exposed. There is no tunneling or undermining noted. There is a medium amount of serosanguineous drainage noted. The wound margin is distinct with the outline attached to the wound base. There is medium (34-66%) pink, pale granulation within the wound bed. There is a medium (34-66%) amount of necrotic tissue within the wound bed including Adherent Slough. The periwound skin appearance had no  abnormalities noted for texture. The periwound skin appearance had no abnormalities noted for moisture. The periwound skin appearance had no abnormalities noted for color. Periwound temperature was noted as No Abnormality. The periwound has tenderness on palpation. 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 There was a Selective/Open Wound Non-Viable Tissue Debridement with a total area of 0.49 sq cm performed by Duanne Guess, MD. With the following instrument(s): Curette to remove Non-Viable tissue/material. Material removed includes Eschar and Slough and after  achieving pain control using Lidocaine 4% T opical Solution. No specimens were taken. A time out was conducted at 13:45, prior to the start of the procedure. A Minimum amount of bleeding was controlled with Pressure. The procedure was tolerated well with a pain level of 0 throughout and a pain level of 0 following the procedure. Post Debridement Measurements: 0.7cm length x 0.9cm width x 0.1cm depth; 0.049cm^3 volume. Post debridement Stage noted as Category/Stage III. Character of Wound/Ulcer Post Debridement is improved. Post procedure Diagnosis Wound #1: Same as Pre-Procedure General Notes: Scribed for Dr. Lady Gary by J.Scotton. Pre-procedure diagnosis of Wound #1 is a Pressure Ulcer located on the Right,Medial Malleolus. A skin graft procedure using a bioengineered skin substitute/cellular or tissue based product was performed by Duanne Guess, MD with the following instrument(s): Forceps and Scissors. Oasis  tri-layer was applied and secured with Steri-Strips. 2.5 sq cm of product was utilized and 3 sq cm was wasted due to size of wound. Post Application, Sorbact gauze was applied. A Time Out was conducted at 13:45, prior to the start of the procedure. The procedure was tolerated well with a pain level of 0 throughout and a pain level of 0 following the procedure. Post procedure Diagnosis Wound #1: Same as Pre-Procedure General Notes: Scribed for Dr. Lady Gary by J.Scotton. Wound #4 Pre-procedure diagnosis of Wound #4 is a Pressure Ulcer located on the Left,Lateral Ankle . There was a Selective/Open Wound Non-Viable Tissue Debridement with a total area of 0.78 sq cm performed by Duanne Guess, MD. With the following instrument(s): Curette to remove Non-Viable tissue/material. Material removed includes Eschar and Slough and after achieving pain control using Lidocaine 4% T opical Solution. No specimens were taken. A time out was conducted at 13:45, prior to the start of the procedure. A Minimum amount of bleeding was controlled with Pressure. The procedure was tolerated well with a pain level of 0 throughout and a pain level of 0 following the procedure. Post Debridement Measurements: 1cm length x 1cm width x 0.3cm depth; 0.236cm^3 volume. Post debridement Stage noted as Category/Stage IV. Character of Wound/Ulcer Post Debridement is improved. Post procedure Diagnosis Wound #4: Same as Pre-Procedure General Notes: Scribed for Dr. Lady Gary by J.Scotton. Pre-procedure diagnosis of Wound #4 is a Pressure Ulcer located on the Left,Lateral Ankle. A skin graft procedure using a bioengineered skin substitute/cellular or tissue based product was performed by Duanne Guess, MD with the following instrument(s): Forceps and Scissors. Oasis tri-layer was applied and secured with Steri-Strips. 3.5 sq cm of product was utilized and 2 sq cm was wasted due to size of wound. Post Application, Sorbact gauze was applied.  A Time Out was conducted at 13:45, prior to the start of the procedure. The procedure was tolerated well with a pain level of 0 throughout and a pain level of 0 following the procedure. Post procedure Diagnosis Wound #4: Same as Pre-Procedure General Notes: Scribed for Dr. Lady Gary by J.Scotton. NORLEEN, DUMITRESCU A ([id number]) [id number] Page 12 of 15 Plan Follow-up Appointments: Return Appointment in 1 week. - Dr. Lady Gary - Room 1 Anesthetic: (In clinic) Topical Lidocaine 4% applied to wound bed Cellular or Tissue Based Products: Wound #1 Right,Medial Malleolus: Cellular or Tissue Based Product Type: - oasis tri-layer #9 Cellular or Tissue Based Product applied to wound bed, secured with steri-strips, cover with Adaptic or Mepitel. (DO NOT REMOVE). Wound #4 Left,Lateral Ankle: Cellular or Tissue Based Product Type: - oasis tri-layer #9 Cellular or Tissue Based Product applied to wound bed, secured with  steri-strips, cover with Adaptic or Mepitel. (DO NOT REMOVE). Bathing/ Shower/ Hygiene: May shower with protection but do not get wound dressing(s) wet. Protect dressing(s) with water repellant cover (for example, large plastic bag) or a cast cover and may then take shower. Off-Loading: Multipodus Splint to: - prevalon boot to both feet Turn and reposition every 2 hours Home Health: No change in wound care orders this week; continue Home Health for wound care. May utilize formulary equivalent dressing for wound treatment orders unless otherwise specified. - may chage outer dressing only on ankles, DO NOT remove sterostrips Dressing changes to be completed by Home Health on Monday / Wednesday / Friday except when patient has scheduled visit at [facility]. Other Home Health Orders/Instructions: - [facility] WOUND #1: - Malleolus Wound Laterality: Right, Medial Cleanser: Normal Saline (Generic) 1 x Per Week/30 Days Discharge Instructions: Cleanse the wound with  Normal Saline prior to applying a clean dressing using gauze sponges, not tissue or cotton balls. Cleanser: Byram Ancillary Kit - 15 Day Supply (Generic) 1 x Per Week/30 Days Discharge Instructions: Use supplies as instructed; Kit contains: (15) Saline Bullets; (15) 3x3 Gauze; 15 pr Gloves Peri-Wound Care: Skin Prep (Generic) 1 x Per Week/30 Days Discharge Instructions: Use skin prep as directed Prim Dressing: Cutimed Sorbact Swab 1 x Per Week/30 Days ary Discharge Instructions: Apply to wound bed as instructed Prim Dressing: Oasis tri-layer 1 x Per Week/30 Days ary Secondary Dressing: Woven Gauze Sponge, Non-Sterile 4x4 in 1 x Per Week/30 Days Discharge Instructions: Apply over primary dressing as directed. Secured With: [employer], 4.5x3.1 (in/yd) 1 x Per Week/30 Days Discharge Instructions: Secure with Kerlix as directed. Secured With: 70M Medipore Scientist, research (life sciences) Surgical T 2x10 (in/yd) (Generic) 1 x Per Week/30 Days ape Discharge Instructions: Secure with tape as directed. WOUND #2: - Gluteus Wound Laterality: Right Cleanser: Normal Saline (Generic) 1 x Per Day/30 Days Discharge Instructions: Cleanse the wound with Normal Saline prior to applying a clean dressing using gauze sponges, not tissue or cotton balls. Peri-Wound Care: Skin Prep (Generic) 1 x Per Day/30 Days Discharge Instructions: Use skin prep as directed Prim Dressing: Dakin's Solution 0.25%, 16 (oz) (Generic) 1 x Per Day/30 Days ary Discharge Instructions: Moisten gauze with Dakin's solution Prim Dressing: Promogran Prisma Matrix, 4.34 (sq in) (silver collagen) (Dispense As Written) 1 x Per Day/30 Days ary Discharge Instructions: Moisten collagen with saline or hydrogel Prim Dressing: cotton tipped applicators (Generic) 1 x Per Day/30 Days ary Discharge Instructions: use to pack wound Secondary Dressing: Zetuvit Plus Silicone Border Dressing 5x5 (in/in) (Generic) 1 x Per Day/30 Days Discharge Instructions: Apply  silicone border over primary dressing as directed. Secured With: 70M Medipore H Soft Cloth Surgical T ape, 4 x 10 (in/yd) (Generic) 1 x Per Day/30 Days Discharge Instructions: Secure with tape as directed. WOUND #4: - Ankle Wound Laterality: Left, Lateral Cleanser: Normal Saline (Generic) 1 x Per Week/30 Days Discharge Instructions: Cleanse the wound with Normal Saline prior to applying a clean dressing using gauze sponges, not tissue or cotton balls. Cleanser: Byram Ancillary Kit - 15 Day Supply (Generic) 1 x Per Week/30 Days Discharge Instructions: Use supplies as instructed; Kit contains: (15) Saline Bullets; (15) 3x3 Gauze; 15 pr Gloves Peri-Wound Care: Skin Prep (Generic) 1 x Per Week/30 Days Discharge Instructions: Use skin prep as directed Prim Dressing: Cutimed Sorbact Swab 1 x Per Week/30 Days ary Discharge Instructions: Apply to wound bed as instructed Prim Dressing: Oasis tri-layer 1 x Per Week/30 Days ary  Secondary Dressing: Drawtex 4x4 in 1 x Per Week/30 Days Discharge Instructions: Apply over primary dressing as directed. Secondary Dressing: Woven Gauze Sponge, Non-Sterile 4x4 in 1 x Per Week/30 Days Discharge Instructions: Apply over primary dressing as directed. Secured With: [employer], 4.5x3.1 (in/yd) 1 x Per Week/30 Days Discharge Instructions: Secure with Kerlix as directed. Secured With: 11M Medipore Scientist, research (life sciences) Surgical T 2x10 (in/yd) (Generic) 1 x Per Week/30 Days ape Discharge Instructions: Secure with tape as directed. 09/14/2022: Unfortunately, the buttock ulcer cavity measured a little larger and a little deeper today. The patient did say she has been sitting a bit more of late. The right medial ankle wound is about the same size; the left lateral ankle wound is a little smaller and shallower. There is an area on the medial aspect of her right calcaneus that looks concerning for potential pressure-induced deep tissue injury. The buttock ulcer did not require  any debridement. I debrided slough from both of the ankles. Oasis Tri layer was then moistened with saline and applied in standard fashion to both ankle sites. I used a piece of drawtex cut into a small disc to hold the skin substitute against the wound surface on the left lateral ankle. I closely examined the area on her right medial calcaneus and at this point, there is no frank tissue breakdown. We will provide her with new Prevalon boots to help avoid this happening. We will continue to pack the buttock ulcer with Dakin's-moistened gauze with a piece of Prisma silver collagen at the base. The patient was reminded of the importance of offloading this wound. She will follow-up in 1 week. SACHA, HA A ([id number]) [id number] Page 13 of 15 Electronic Signature(s) Signed: 09/14/2022 3:05:51 PM By: Duanne Guess MD FACS Entered By: Duanne Guess on 09/14/2022 12:05:51 -------------------------------------------------------------------------------- HxROS Details Patient Name: Date of Service: Andrea [name] 09/14/2022 1:00 PM Medical Record Number: [id number] Patient Account Number: [id number] Date of Birth/Sex: Treating RN: [birth year] (81 y.o. F) Primary Care Provider: Jarome Matin Other Clinician: Referring Provider: Treating Provider/Extender: Marena Chancy in Treatment: 42 Information Obtained From Patient Caregiver Chart Constitutional Symptoms (General Health) Medical History: Past Medical History Notes: morbid obesity Eyes Medical History: Positive for: Cataracts - bil extracted Negative for: Glaucoma; Optic Neuritis Ear/Nose/Mouth/Throat Medical History: Past Medical History Notes: hard of hearing Cardiovascular Medical History: Positive for: Arrhythmia - afib; Hypertension Gastrointestinal Medical History: Past Medical History Notes: GERD Endocrine Medical History: Negative for: Type I Diabetes; Type  II Diabetes Past Medical History Notes: adrenal insufficiency Genitourinary Medical History: Negative for: End Stage Renal Disease Integumentary (Skin) Medical History: Negative for: History of Burn Musculoskeletal Medical History: Positive for: Osteoarthritis; Osteomyelitis - pelvic Past Medical History Notes: psoriatic arthritis, thoracic discitis, displaced spiral fx right tibia CHARRYSE, RABANAL A ([id number]) [id number] Page 14 of 15 Neurologic Medical History: Positive for: Neuropathy Past Medical History Notes: hx [facility] Oncologic Medical History: Negative for: Received Chemotherapy; Received Radiation Psychiatric Medical History: Positive for: Confinement Anxiety Negative for: Anorexia/bulimia HBO Extended History Items Eyes: Cataracts Immunizations Pneumococcal Vaccine: Received Pneumococcal Vaccination: Yes Received Pneumococcal Vaccination On or After 60th Birthday: Yes Implantable Devices None Hospitalization / Surgery History Type of Hospitalization/Surgery TEE right ankle fusion bil knee replacements bil cataract extractions posterior lumbar fusion vaginal hysterectomy cholecystectomy Family and Social History Cancer: Yes - Father,Maternal Grandparents,Siblings,Child; Diabetes: No; Heart Disease: Yes - Father; Hereditary Spherocytosis: No; Hypertension: Yes - Father; Kidney Disease: No; Lung Disease: No; Seizures: No; Stroke: No; Thyroid  Problems: Yes - Mother; Tuberculosis: No; Former smoker - quit 40 yr ago; Marital Status - Widowed; Alcohol Use: Never; Drug Use: No History; Caffeine Use: Daily - tea, soda; Financial Concerns: No; Food, Clothing or Shelter Needs: No; Support System Lacking: No; Transportation Concerns: No Psychologist, prison and probation services) Signed: 09/14/2022 4:17:12 PM By: Duanne Guess MD FACS Entered By: Duanne Guess on 09/14/2022  12:02:54 -------------------------------------------------------------------------------- SuperBill Details Patient Name: Date of Service: Andrea [name] 09/14/2022 Medical Record Number: [id number] Patient Account Number: [id number] Date of Birth/Sex: Treating RN: 11/18/1940 (81 y.o. 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 L02.31 Cutaneous abscess of buttock E27.40 Unspecified adrenocortical insufficiency Z79.52 Long term (current) use of systemic steroids I10 Essential (primary) hypertension Facility Procedures : CPT4: Code 13244010 Q Description: 4124 Oasis Ultra per sq CM Modifier: Quantity: 11 : CPT4: 27253664 C o Description: 5271 Application of skin substitute graft to trunk, arms, legs, total wound surface area up to 100 sq cm; first 25 sqcm r less wound surface area ICD-10 Diagnosis Description 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, stage 3 L89.523 Pressure  ulcer of left ankle, stage 3 Modifier: Quantity: 1 Physician Procedures : CPT4: Description Modifier Code [id number] - WC PHYS LEVEL 3 - EST PT 25 ICD-10 Diagnosis Description 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, stage 3 L89.523 Pressure ulcer of left ankle, stage 3 L98.415 Non-pressure chronic ulcer of buttock with  muscle involvement without evidence of necrosis Z79.52 Long term (current) use of systemic steroids Quantity: 1 : CPT4: C5271 Application of skin substitute graft to trunk, arms, legs, total wound surface area up to 100 sq cm; first 25 sqcm  or less wound surface area ICD-10 Diagnosis Description 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

## 2022-09-21 ENCOUNTER — Ambulatory Visit (HOSPITAL_BASED_OUTPATIENT_CLINIC_OR_DEPARTMENT_OTHER): Payer: Medicare Other | Admitting: General Surgery

## 2022-09-28 ENCOUNTER — Encounter (HOSPITAL_BASED_OUTPATIENT_CLINIC_OR_DEPARTMENT_OTHER): Payer: Medicare Other | Attending: General Surgery | Admitting: General Surgery

## 2022-09-28 DIAGNOSIS — K219 Gastro-esophageal reflux disease without esophagitis: Secondary | ICD-10-CM | POA: Insufficient documentation

## 2022-09-28 DIAGNOSIS — M1990 Unspecified osteoarthritis, unspecified site: Secondary | ICD-10-CM | POA: Insufficient documentation

## 2022-09-28 DIAGNOSIS — Z87891 Personal history of nicotine dependence: Secondary | ICD-10-CM | POA: Insufficient documentation

## 2022-09-28 DIAGNOSIS — I10 Essential (primary) hypertension: Secondary | ICD-10-CM | POA: Insufficient documentation

## 2022-09-28 DIAGNOSIS — G629 Polyneuropathy, unspecified: Secondary | ICD-10-CM | POA: Insufficient documentation

## 2022-09-28 DIAGNOSIS — Z981 Arthrodesis status: Secondary | ICD-10-CM | POA: Insufficient documentation

## 2022-09-28 DIAGNOSIS — I4891 Unspecified atrial fibrillation: Secondary | ICD-10-CM | POA: Insufficient documentation

## 2022-09-28 DIAGNOSIS — L89523 Pressure ulcer of left ankle, stage 3: Secondary | ICD-10-CM | POA: Insufficient documentation

## 2022-09-28 DIAGNOSIS — Z7952 Long term (current) use of systemic steroids: Secondary | ICD-10-CM | POA: Insufficient documentation

## 2022-09-28 DIAGNOSIS — W2203XA Walked into furniture, initial encounter: Secondary | ICD-10-CM | POA: Insufficient documentation

## 2022-09-28 DIAGNOSIS — F419 Anxiety disorder, unspecified: Secondary | ICD-10-CM | POA: Insufficient documentation

## 2022-09-28 DIAGNOSIS — L98415 Non-pressure chronic ulcer of buttock with muscle involvement without evidence of necrosis: Secondary | ICD-10-CM | POA: Insufficient documentation

## 2022-09-28 DIAGNOSIS — E2740 Unspecified adrenocortical insufficiency: Secondary | ICD-10-CM | POA: Insufficient documentation

## 2022-09-28 DIAGNOSIS — Z96653 Presence of artificial knee joint, bilateral: Secondary | ICD-10-CM | POA: Insufficient documentation

## 2022-09-28 DIAGNOSIS — L89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, stage 3: Secondary | ICD-10-CM | POA: Insufficient documentation

## 2022-09-28 DIAGNOSIS — L0231 Cutaneous abscess of buttock: Secondary | ICD-10-CM | POA: Insufficient documentation

## 2022-09-29 ENCOUNTER — Encounter (HOSPITAL_BASED_OUTPATIENT_CLINIC_OR_DEPARTMENT_OTHER): Payer: Medicare Other | Admitting: General Surgery

## 2022-09-29 DIAGNOSIS — F419 Anxiety disorder, unspecified: Secondary | ICD-10-CM | POA: Diagnosis not present

## 2022-09-29 DIAGNOSIS — I10 Essential (primary) hypertension: Secondary | ICD-10-CM | POA: Diagnosis not present

## 2022-09-29 DIAGNOSIS — Z87891 Personal history of nicotine dependence: Secondary | ICD-10-CM | POA: Diagnosis not present

## 2022-09-29 DIAGNOSIS — L98415 Non-pressure chronic ulcer of buttock with muscle involvement without evidence of necrosis: Secondary | ICD-10-CM | POA: Diagnosis not present

## 2022-09-29 DIAGNOSIS — I4891 Unspecified atrial fibrillation: Secondary | ICD-10-CM | POA: Diagnosis not present

## 2022-09-29 DIAGNOSIS — M1990 Unspecified osteoarthritis, unspecified site: Secondary | ICD-10-CM | POA: Diagnosis not present

## 2022-09-29 DIAGNOSIS — K219 Gastro-esophageal reflux disease without esophagitis: Secondary | ICD-10-CM | POA: Diagnosis not present

## 2022-09-29 DIAGNOSIS — Z7952 Long term (current) use of systemic steroids: Secondary | ICD-10-CM | POA: Diagnosis not present

## 2022-09-29 DIAGNOSIS — L89523 Pressure ulcer of left ankle, stage 3: Secondary | ICD-10-CM | POA: Diagnosis present

## 2022-09-29 DIAGNOSIS — W2203XA Walked into furniture, initial encounter: Secondary | ICD-10-CM | POA: Diagnosis not present

## 2022-09-29 DIAGNOSIS — Z96653 Presence of artificial knee joint, bilateral: Secondary | ICD-10-CM | POA: Diagnosis not present

## 2022-09-29 DIAGNOSIS — Z981 Arthrodesis status: Secondary | ICD-10-CM | POA: Diagnosis not present

## 2022-09-29 DIAGNOSIS — L0231 Cutaneous abscess of buttock: Secondary | ICD-10-CM | POA: Diagnosis not present

## 2022-09-29 DIAGNOSIS — L89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, stage 3: Secondary | ICD-10-CM | POA: Diagnosis not present

## 2022-09-29 DIAGNOSIS — G629 Polyneuropathy, unspecified: Secondary | ICD-10-CM | POA: Diagnosis not present

## 2022-09-29 DIAGNOSIS — E2740 Unspecified adrenocortical insufficiency: Secondary | ICD-10-CM | POA: Diagnosis not present

## 2022-10-05 ENCOUNTER — Encounter (HOSPITAL_BASED_OUTPATIENT_CLINIC_OR_DEPARTMENT_OTHER): Payer: Medicare Other | Admitting: General Surgery

## 2022-10-05 DIAGNOSIS — L89523 Pressure ulcer of left ankle, stage 3: Secondary | ICD-10-CM | POA: Diagnosis not present

## 2022-10-05 NOTE — Progress Notes (Signed)
CHADSITY, SOWDER A ([id number]) [id number] Page 1 of 9 Visit Report for 09/29/2022 Arrival Information Details Patient Name: Date of Service: Andrea [name] 09/29/2022 1:00 PM Medical Record Number: [id number] Patient Account Number: [id number] Date of Birth/Sex: Treating RN: [birth year] (82 y.o. Andrea [name] Primary Care Leobardo Granlund: Jarome Matin Other Clinician: Referring Nell Gales: Treating Jaxxon Naeem/Extender: Marena Chancy in Treatment: 1 Visit Information History Since Last Visit All ordered tests and consults were completed: Yes Patient Arrived: Wheel Chair Added or deleted any medications: No Arrival Time: 13:11 Any new allergies or adverse reactions: No Accompanied By: daughter Had a fall or experienced change in No Transfer Assistance: EasyPivot Patient Lift activities of daily living that may affect Patient Identification Verified: Yes risk of falls: Secondary Verification Process Completed: Yes Signs or symptoms of abuse/neglect since last visito No Patient Requires Transmission-Based Precautions: No Hospitalized since last visit: No Patient Has Alerts: No Implantable device outside of the clinic excluding No cellular tissue based products placed in the center since last visit: Has Dressing in Place as Prescribed: Yes Pain Present Now: No Electronic Signature(s) Signed: 10/05/2022 3:48:27 PM By: Brenton Grills Entered By: Brenton Grills on 09/29/2022 10:12:06 -------------------------------------------------------------------------------- Encounter Discharge Information Details Patient Name: Date of Service: [name] 09/29/2022 1:00 PM Medical Record Number: [id number] Patient Account Number: [id number] Date of Birth/Sex: Treating RN: 1940/02/11 (81 y.o. Andrea [name] Primary Care Amos Gaber: Jarome Matin Other Clinician: Referring Harpreet Signore: Treating Cali Cuartas/Extender: Marena Chancy in Treatment: 45 Encounter Discharge Information Items Post Procedure Vitals Discharge Condition: Stable Temperature (F): 97.8 Ambulatory Status: Wheelchair Pulse (bpm): 84 Discharge Destination: Home Respiratory Rate (breaths/min): 18 Transportation: Private Auto Blood Pressure (mmHg): 136/78 Accompanied By: daughter Schedule Follow-up Appointment: Yes Clinical Summary of Care: Patient Declined Electronic Signature(s) Signed: 10/05/2022 3:48:27 PM By: Brenton Grills Entered By: Brenton Grills on 09/29/2022 11:15:38 [name] ([id number]) [id number] Page 2 of 9 -------------------------------------------------------------------------------- Lower Extremity Assessment Details Patient Name: Date of Service: Andrea [name] 09/29/2022 1:00 PM Medical Record Number: [id number] Patient Account Number: [id number] Date of Birth/Sex: Treating RN: 31-Oct-1940 (82 y.o. Andrea [name] Primary Care Andrea [name]: Jarome Matin Other Clinician: Referring Marico Buckle: Treating Tiffay Pinette/Extender: Marena Chancy in Treatment: 44 Edema Assessment Assessed: Kyra Searles: No] Franne Forts: No] Edema: [Left: No] [Right: No] Calf Left: Right: Point of Measurement: From Medial Instep 28 cm 32 cm Ankle Left: Right: Point of Measurement: From Medial Instep 18.2 cm 22 cm Vascular Assessment Pulses: Dorsalis Pedis Palpable: [Left:Yes] [Right:Yes] Extremity colors, hair growth, and conditions: Extremity Color: [Left:Hyperpigmented] [Right:Hyperpigmented] Hair Growth on Extremity: [Left:No] [Right:No] Temperature of Extremity: [Left:Warm < 3 seconds] [Right:Warm < 3 seconds] Toe Nail Assessment Left: Right: Thick: Yes Yes Discolored: Yes Yes Deformed: Yes Yes Improper Length and Hygiene: Yes Yes Electronic Signature(s) Signed: 10/05/2022 3:48:27 PM By: Brenton Grills Entered By: Brenton Grills on 09/29/2022  10:13:24 -------------------------------------------------------------------------------- Multi Wound Chart Details Patient Name: Date of Service: Andrea [name] A. 09/29/2022 1:00 PM Medical Record Number: [id number] Patient Account Number: [id number] Date of Birth/Sex: Treating RN: 04/10/1940 (81 y.o. F) Primary Care Damien Batty: Jarome Matin Other Clinician: Referring Devri Kreher: Treating Spero Gunnels/Extender: Marena Chancy in Treatment: 44 Vital Signs Height(in): 59 Pulse(bpm): 81 Weight(lbs): 155 Blood Pressure(mmHg): 150/91 Body Mass Index(BMI): 31.3 Temperature(F): 97.6 Respiratory Rate(breaths/min): 16 Satterwhite, Isabelle A ([id number]) [id number] Page 3 of 9 [1:Photos:] Right, Medial Malleolus Right Gluteus Left, Lateral Ankle Wound Location: Pressure Injury Bump Pressure Injury Wounding Event:  Pressure Ulcer Abscess Pressure Ulcer Primary Etiology: Cataracts, Arrhythmia, Hypertension,Cataracts, Arrhythmia, Hypertension, Cataracts, Arrhythmia, Hypertension, Comorbid History: Osteoarthritis, Osteomyelitis, Osteoarthritis, Osteomyelitis, Osteoarthritis, Osteomyelitis, Neuropathy, Confinement Anxiety Neuropathy, Confinement Anxiety Neuropathy, Confinement Anxiety 11/16/2021 09/28/2021 12/23/2021 Date Acquired: 44 44 38 Weeks of Treatment: Open Open Open Wound Status: No No No Wound Recurrence: 0.7x0.8x0.1 1x2x4.5 0.8x0.9x0.1 Measurements L x W x D (cm) 0.44 1.571 0.565 A (cm) : rea 0.044 7.069 0.057 Volume (cm) : 71.40% 85.00% -300.70% % Reduction in A rea: 71.40% 77.40% -307.10% % Reduction in Volume: Category/Stage III Full Thickness With Exposed Support Category/Stage IV Classification: Structures Medium Medium Medium Exudate A mount: Serosanguineous Serous Serosanguineous Exudate Type: red, brown amber red, brown Exudate Color: Distinct, outline attached Epibole Distinct, outline attached Wound Margin: Small  (1-33%) Large (67-100%) Medium (34-66%) Granulation A mount: Red Red Pink, Pale Granulation Quality: Large (67-100%) Small (1-33%) Medium (34-66%) Necrotic A mount: Eschar, Adherent Slough Adherent [employer] Necrotic Tissue: Fat Layer (Subcutaneous Tissue): Yes Fat Layer (Subcutaneous Tissue): Yes Fat Layer (Subcutaneous Tissue): Yes Exposed Structures: Fascia: No Fascia: No Tendon: Yes Tendon: No Tendon: No Fascia: No Muscle: No Muscle: No Muscle: No Joint: No Joint: No Joint: No Bone: No Bone: No Bone: No Small (1-33%) Small (1-33%) None Epithelialization: Debridement - Selective/Open Wound N/A Debridement - Selective/Open Wound Debridement: Pre-procedure Verification/Time Out 13:44 N/A 13:44 Taken: Lidocaine 4% Topical Solution N/A Lidocaine 4% Topical Solution Pain Control: Slough N/A Slough Tissue Debrided: Non-Viable Tissue N/A Non-Viable Tissue Level: 0.44 N/A 0.57 Debridement A (sq cm): rea Curette N/A Curette Instrument: Minimum N/A Minimum Bleeding: Pressure N/A Pressure Hemostasis A chieved: Procedure was tolerated well N/A Procedure was tolerated well Debridement Treatment Response: 0.7x0.8x0.1 N/A 0.8x0.9x0.1 Post Debridement Measurements L x W x D (cm) 0.044 N/A 0.057 Post Debridement Volume: (cm) Category/Stage III N/A Category/Stage IV Post Debridement Stage: No Abnormalities Noted No Abnormalities Noted No Abnormalities Noted Periwound Skin Texture: No Abnormalities Noted No Abnormalities Noted No Abnormalities Noted Periwound Skin Moisture: Erythema: No Erythema: No Erythema: No Periwound Skin Color: Rubor: No No Abnormality No Abnormality No Abnormality Temperature: N/A Yes Yes Tenderness on Palpation: Cellular or Tissue Based Product N/A Cellular or Tissue Based Product Procedures Performed: Debridement Debridement Treatment Notes Electronic Signature(s) Signed: 09/29/2022 1:58:36 PM By: Duanne Guess MD  FACS Entered By: Duanne Guess on 09/29/2022 10:58:36 [name] ([id number]) [id number] Page 4 of 9 -------------------------------------------------------------------------------- Multi-Disciplinary Care Plan Details Patient Name: Date of Service: Andrea [name] 09/29/2022 1:00 PM Medical Record Number: [id number] Patient Account Number: [id number] Date of Birth/Sex: Treating RN: 1940-12-20 (82 y.o. Andrea [name] Primary Care Marcey Persad: Jarome Matin Other Clinician: Referring Hyun Reali: Treating Juanantonio Stolar/Extender: Marena Chancy in Treatment: 15 Multidisciplinary Care Plan reviewed with physician Active Inactive Pressure Nursing Diagnoses: Knowledge deficit related to causes and risk factors for pressure ulcer development Knowledge deficit related to management of pressures ulcers Potential for impaired tissue integrity related to pressure, friction, moisture, and shear Goals: Patient/caregiver will verbalize understanding of pressure ulcer management Date Initiated: 11/24/2021 Target Resolution Date: 11/24/2022 Goal Status: Active Interventions: Assess: immobility, friction, shearing, incontinence upon admission and as needed Assess offloading mechanisms upon admission and as needed Assess potential for pressure ulcer upon admission and as needed Provide education on pressure ulcers Treatment Activities: Pressure reduction/relief device ordered : 11/24/2021 Notes: Wound/Skin Impairment Nursing Diagnoses: Impaired tissue integrity Knowledge deficit related to ulceration/compromised skin integrity Goals: Patient/caregiver will verbalize understanding of skin care regimen Date Initiated: 11/24/2021 Target Resolution Date: 11/24/2022  Goal Status: Active Ulcer/skin breakdown will have a volume reduction of 30% by week 4 Date Initiated: 11/24/2021 Date Inactivated: 05/31/2022 Target Resolution Date:  05/25/2022 Unmet Reason: refuses VAC, Goal Status: Unmet noncompliant with offloading Interventions: Assess patient/caregiver ability to obtain necessary supplies Assess patient/caregiver ability to perform ulcer/skin care regimen upon admission and as needed Assess ulceration(s) every visit Provide education on ulcer and skin care Treatment Activities: Skin care regimen initiated : 11/24/2021 Topical wound management initiated : 11/24/2021 Notes: Electronic Signature(s) Signed: 10/05/2022 3:48:27 PM By: Brenton Grills Entered By: Brenton Grills on 09/29/2022 10:39:22 [name] ([id number]) [id number] Page 5 of 9 -------------------------------------------------------------------------------- Pain Assessment Details Patient Name: Date of Service: Andrea [name] 09/29/2022 1:00 PM Medical Record Number: [id number] Patient Account Number: [id number] Date of Birth/Sex: Treating RN: 08/30/1940 (82 y.o. Andrea [name] Primary Care Conroy Goracke: Jarome Matin Other Clinician: Referring Christyan Reger: Treating Ah Bott/Extender: Marena Chancy in Treatment: 38 Active Problems Location of Pain Severity and Description of Pain Patient Has Paino No Site Locations Pain Management and Medication Current Pain Management: Electronic Signature(s) Signed: 10/05/2022 3:48:27 PM By: Brenton Grills Entered By: Brenton Grills on 09/29/2022 10:13:05 -------------------------------------------------------------------------------- Patient/Caregiver Education Details Patient Name: Date of Service: Andrea [name] 9/4/2024andnbsp1:00 PM Medical Record Number: [id number] Patient Account Number: [id number] Date of Birth/Gender: Treating RN: Dec 22, 1940 (82 y.o. Andrea [name] Primary Care Physician: Jarome Matin Other Clinician: Referring Physician: Treating Physician/Extender: Marena Chancy in Treatment:  83 Education Assessment Education Provided To: Patient and Caregiver Education Topics Provided Wound/Skin Impairment: Methods: Explain/Verbal Responses: State content correctly JENNAVIEVE, MANCINE A ([id number]) [id number] Page 6 of 9 Electronic Signature(s) Signed: 10/05/2022 3:48:27 PM By: Brenton Grills Entered By: Brenton Grills on 09/29/2022 10:39:41 -------------------------------------------------------------------------------- Wound Assessment Details Patient Name: Date of Service: Andrea [name] 09/29/2022 1:00 PM Medical Record Number: [id number] Patient Account Number: [id number] Date of Birth/Sex: Treating RN: 1940/02/24 (82 y.o. Andrea [name] Primary Care Rainee Sweatt: Jarome Matin Other Clinician: Referring Tatia Petrucci: Treating Nasiya Pascual/Extender: Marena Chancy in Treatment: 44 Wound Status Wound Number: 1 Primary Pressure Ulcer Etiology: Wound Location: Right, Medial Malleolus Wound Open Wounding Event: Pressure Injury Status: Date Acquired: 11/16/2021 Comorbid Cataracts, Arrhythmia, Hypertension, Osteoarthritis, Weeks Of Treatment: 44 History: Osteomyelitis, Neuropathy, Confinement Anxiety Clustered Wound: No Photos Wound Measurements Length: (cm) 0.7 Width: (cm) 0.8 Depth: (cm) 0.1 Area: (cm) 0.44 Volume: (cm) 0.044 % Reduction in Area: 71.4% % Reduction in Volume: 71.4% Epithelialization: Small (1-33%) Tunneling: No Undermining: No Wound Description Classification: Category/Stage III Wound Margin: Distinct, outline attached Exudate Amount: Medium Exudate Type: Serosanguineous Exudate Color: red, brown Foul Odor After Cleansing: No Slough/Fibrino Yes Wound Bed Granulation Amount: Small (1-33%) Exposed Structure Granulation Quality: Red Fascia Exposed: No Necrotic Amount: Large (67-100%) Fat Layer (Subcutaneous Tissue) Exposed: Yes Necrotic Quality: Eschar, Adherent Slough Tendon Exposed:  No Muscle Exposed: No Joint Exposed: No Bone Exposed: No Periwound Skin Texture Texture Color No Abnormalities Noted: Yes No Abnormalities Noted: No Erythema: No Moisture No Abnormalities Noted: Yes Temperature / Pain Temperature: No Abnormality PASHION, AUDIBERT A ([id number]) [id number] Page 7 of 9 Electronic Signature(s) Signed: 10/05/2022 3:48:27 PM By: Brenton Grills Entered By: Brenton Grills on 09/29/2022 10:28:35 -------------------------------------------------------------------------------- Wound Assessment Details Patient Name: Date of Service: Andrea [name] 09/29/2022 1:00 PM Medical Record Number: [id number] Patient Account Number: [id number] Date of Birth/Sex: Treating RN: 01-27-1940 (82 y.o. Andrea [name] Primary Care Kanyon Seibold: Jarome Matin Other Clinician: Referring Shellene Sweigert: Treating Jermiah Howton/Extender: Lady Gary  Mirian Mo, Haynes Hoehn in Treatment: 44 Wound Status Wound Number: 2 Primary Abscess Etiology: Wound Location: Right Gluteus Wound Open Wounding Event: Bump Status: Date Acquired: 09/28/2021 Comorbid Cataracts, Arrhythmia, Hypertension, Osteoarthritis, Weeks Of Treatment: 44 History: Osteomyelitis, Neuropathy, Confinement Anxiety Clustered Wound: No Photos Wound Measurements Length: (cm) 1 Width: (cm) 2 Depth: (cm) 4.5 Area: (cm) 1.571 Volume: (cm) 7.069 % Reduction in Area: 85% % Reduction in Volume: 77.4% Epithelialization: Small (1-33%) Wound Description Classification: Full Thickness With Exposed Suppo Wound Margin: Epibole Exudate Amount: Medium Exudate Type: Serous Exudate Color: amber rt Structures Foul Odor After Cleansing: No Slough/Fibrino Yes Wound Bed Granulation Amount: Large (67-100%) Exposed Structure Granulation Quality: Red Fascia Exposed: No Necrotic Amount: Small (1-33%) Fat Layer (Subcutaneous Tissue) Exposed: Yes Necrotic Quality: Adherent Slough Tendon Exposed: No Muscle  Exposed: No Joint Exposed: No Bone Exposed: No Periwound Skin Texture Texture Color No Abnormalities Noted: Yes No Abnormalities Noted: Yes Moisture Temperature / Pain No Abnormalities Noted: Yes Temperature: No Abnormality Tenderness on Palpation: Yes NIASIA, LENNOX A ([id number]) [id number] Page 8 of 9 Electronic Signature(s) Signed: 10/05/2022 3:48:27 PM By: Brenton Grills Entered By: Brenton Grills on 09/29/2022 10:26:43 -------------------------------------------------------------------------------- Wound Assessment Details Patient Name: Date of Service: Andrea [name] 09/29/2022 1:00 PM Medical Record Number: [id number] Patient Account Number: [id number] Date of Birth/Sex: Treating RN: 10/16/1940 (82 y.o. Andrea [name] Primary Care Socorro Kanitz: Jarome Matin Other Clinician: Referring Firman Petrow: Treating Cirilo Canner/Extender: Marena Chancy in Treatment: 44 Wound Status Wound Number: 4 Primary Pressure Ulcer Etiology: Wound Location: Left, Lateral Ankle Wound Open Wounding Event: Pressure Injury Status: Date Acquired: 12/23/2021 Comorbid Cataracts, Arrhythmia, Hypertension, Osteoarthritis, Weeks Of Treatment: 38 History: Osteomyelitis, Neuropathy, Confinement Anxiety Clustered Wound: No Photos Wound Measurements Length: (cm) 0.8 Width: (cm) 0.9 Depth: (cm) 0.1 Area: (cm) 0.565 Volume: (cm) 0.057 % Reduction in Area: -300.7% % Reduction in Volume: -307.1% Epithelialization: None Wound Description Classification: Category/Stage IV Wound Margin: Distinct, outline attached Exudate Amount: Medium Exudate Type: Serosanguineous Exudate Color: red, brown Foul Odor After Cleansing: No Slough/Fibrino Yes Wound Bed Granulation Amount: Medium (34-66%) Exposed Structure Granulation Quality: Pink, Pale Fascia Exposed: No Necrotic Amount: Medium (34-66%) Fat Layer (Subcutaneous Tissue) Exposed: Yes Necrotic Quality:  Adherent Slough Tendon Exposed: Yes Muscle Exposed: No Joint Exposed: No Bone Exposed: No Periwound Skin Texture Texture Color No Abnormalities Noted: Yes No Abnormalities Noted: Yes Moisture Temperature / Pain No Abnormalities Noted: Yes Temperature: No Abnormality Tenderness on Palpation: Yes NANCY, GORBET A ([id number]) [id number] Page 9 of 9 Electronic Signature(s) Signed: 10/05/2022 3:48:27 PM By: Brenton Grills Entered By: Brenton Grills on 09/29/2022 10:34:03 -------------------------------------------------------------------------------- Vitals Details Patient Name: Date of Service: [name] 09/29/2022 1:00 PM Medical Record Number: [id number] Patient Account Number: [id number] Date of Birth/Sex: Treating RN: Feb 05, 1940 (82 y.o. Andrea [name] Primary Care Dijuan Sleeth: Jarome Matin Other Clinician: Referring Nicholai Willette: Treating Gaetana Kawahara/Extender: Marena Chancy in Treatment: 44 Vital Signs Time Taken: 13:12 Temperature (F): 97.6 Height (in): 59 Pulse (bpm): 81 Weight (lbs): 155 Respiratory Rate (breaths/min): 16 Body Mass Index (BMI): 31.3 Blood Pressure (mmHg): 150/91 Reference Range: 80 - 120 mg / dl Electronic Signature(s) Signed: 10/05/2022 3:48:27 PM By: Brenton Grills Entered By: Brenton Grills on 09/29/2022 10:12:49

## 2022-10-05 NOTE — Progress Notes (Addendum)
Andrea [name] ([id number]) [id number] Page 1 of 14 Visit Report for 09/29/2022 Chief Complaint Document Details Patient Name: Date of Service: Andrea [name] 09/29/2022 1:00 PM Medical Record Number: [id number] Patient Account Number: [id number] Date of Birth/Sex: Treating RN: [birth year] (82 y.o. F) Primary Care Provider: Jarome Matin Other Clinician: Referring Provider: Treating Provider/Extender: Marena Chancy in Treatment: 92 Information Obtained from: Patient Chief Complaint Patient is at the clinic for treatment of an open pressure ulcer on her right medial ankle, and [name] large abscess on her right buttock Electronic Signature(s) Signed: 09/29/2022 1:58:48 PM By: Duanne Guess MD FACS Entered By: Duanne Guess on 09/29/2022 13:58:48 -------------------------------------------------------------------------------- Cellular or Tissue Based Product Details Patient Name: Date of Service: Andrea [name] 09/29/2022 1:00 PM Medical Record Number: [id number] Patient Account Number: [id number] Date of Birth/Sex: Treating RN: 12/07/1940 (81 y.o. F) Primary Care Provider: Jarome Matin Other Clinician: Referring Provider: Treating Provider/Extender: Marena Chancy in Treatment: 44 Cellular or Tissue Based Product Type Wound #1 Right,Medial Malleolus Applied to: Performed By: Physician Duanne Guess, MD Cellular or Tissue Based Product Type: Oasis wound matrix Level of Consciousness (Pre-procedure): Awake and Alert Pre-procedure Verification/Time Out Yes - 13:55 Taken: Location: trunk / arms / legs Wound Size (sq cm): 0.56 Product Size (sq cm): 5.5 Waste Size (sq cm): 0 Amount of Product Applied (sq cm): 5.5 Instrument Used: Curette Lot #: [id number] Order #: 10 Expiration Date: 05/01/2024 Fenestrated: No Reconstituted: Yes Solution Type: ns Solution Amount: 3ml Lot #: [id number] Solution  Expiration Date: 05/30/2024 Secured: Yes Secured With: Steri-Strips Dressing Applied: Yes Primary Dressing: Sorbact Procedural Pain: 0 Post Procedural Pain: 0 Response to Treatment: Procedure was tolerated well Level of Consciousness (Post- Awake and Alert Andrea [name], Andrea [name] ([id number]) [id number].pdf Page 2 of 14 procedure): Post Procedure Diagnosis Same as Pre-procedure Notes : Scribed for Dr. Lady Gary by Brenton Grills, RN Electronic Signature(s) Signed: 09/29/2022 2:18:38 PM By: Duanne Guess MD FACS Entered By: Duanne Guess on 09/29/2022 14:18:38 -------------------------------------------------------------------------------- Cellular or Tissue Based Product Details Patient Name: Date of Service: Andrea [name] 09/29/2022 1:00 PM Medical Record Number: [id number] Patient Account Number: [id number] Date of Birth/Sex: Treating RN: 11/13/1940 (81 y.o. F) Primary Care Provider: Jarome Matin Other Clinician: Referring Provider: Treating Provider/Extender: Marena Chancy in Treatment: 44 Cellular or Tissue Based Product Type Wound #4 Left,Lateral Ankle Applied to: Performed By: Physician Duanne Guess, MD Cellular or Tissue Based Product Type: Oasis wound matrix Level of Consciousness (Pre-procedure): Awake and Alert Pre-procedure Verification/Time Out Yes - 13:55 Taken: Location: trunk / arms / legs Wound Size (sq cm): 0.72 Product Size (sq cm): 5.5 Waste Size (sq cm): 0 Amount of Product Applied (sq cm): 5.5 Instrument Used: Curette Lot #: [id number] Order #: 10 Expiration Date: 05/01/2024 Fenestrated: No Reconstituted: Yes Solution Type: ns Solution Amount: 3ml Lot #: [id number] Solution Expiration Date: 05/30/2024 Secured: Yes Secured With: Steri-Strips Dressing Applied: Yes Primary Dressing: Sorbact Procedural Pain: 0 Post Procedural Pain: 0 Response to Treatment: Procedure was tolerated well Level of  Consciousness (Post- Awake and Alert procedure): Post Procedure Diagnosis Same as Pre-procedure Notes : Scribed for Dr. Lady Gary by Brenton Grills, RN Electronic Signature(s) Signed: 09/29/2022 2:18:50 PM By: Duanne Guess MD FACS Entered By: Duanne Guess on 09/29/2022 14:18:50 Andrea [name] ([id number]) [id number] Page 3 of 14 -------------------------------------------------------------------------------- Debridement Details Patient Name: Date of Service: Andrea [name] 09/29/2022 1:00 PM Medical Record Number: [id number] Patient Account Number:  [id number] Date of Birth/Sex: Treating RN: [birth year] (82 y.o. Gevena Mart Primary Care Provider: Jarome Matin Other Clinician: Referring Provider: Treating Provider/Extender: Marena Chancy in Treatment: 44 Debridement Performed for Assessment: Wound #1 Right,Medial Malleolus Performed By: Physician Duanne Guess, MD Debridement Type: Debridement Level of Consciousness (Pre-procedure): Awake and Alert Pre-procedure Verification/Time Out Yes - 13:44 Taken: Start Time: 13:45 Pain Control: Lidocaine 4% T opical Solution Percent of Wound Bed Debrided: 100% T Area Debrided (cm): otal 0.44 Tissue and other material debrided: Non-Viable, Slough, Slough Level: Non-Viable Tissue Debridement Description: Selective/Open Wound Instrument: Curette Bleeding: Minimum Hemostasis Achieved: Pressure End Time: 13:47 Response to Treatment: Procedure was tolerated well Level of Consciousness (Post- Awake and Alert procedure): Post Debridement Measurements of Total Wound Length: (cm) 0.7 Stage: Category/Stage III Width: (cm) 0.8 Depth: (cm) 0.1 Volume: (cm) 0.044 Character of Wound/Ulcer Post Debridement: Improved Post Procedure Diagnosis Same as Pre-procedure Notes : Scribed for Dr. Lady Gary by Brenton Grills, RN Electronic Signature(s) Signed: 09/29/2022 2:34:04 PM By:  Duanne Guess MD FACS Signed: 10/05/2022 3:48:27 PM By: Brenton Grills Entered By: Brenton Grills on 09/29/2022 13:49:08 -------------------------------------------------------------------------------- Debridement Details Patient Name: Date of Service: Andrea [name]. 09/29/2022 1:00 PM Medical Record Number: [id number] Patient Account Number: [id number] Date of Birth/Sex: Treating RN: [birth year] (81 y.o. Gevena Mart Primary Care Provider: Jarome Matin Other Clinician: Referring Provider: Treating Provider/Extender: Marena Chancy in Treatment: 78 Debridement Performed for Assessment: Wound #4 Left,Lateral Ankle Performed By: Physician Duanne Guess, MD Debridement Type: Debridement Level of Consciousness (Pre-procedure): Awake and Alert Pre-procedure Verification/Time Out Yes - 13:44 Taken: Andrea [name], Andrea [name] ([id number]) [phone].pdf Page 4 of 14 Start Time: 13:45 Pain Control: Lidocaine 4% T opical Solution Percent of Wound Bed Debrided: 100% T Area Debrided (cm): otal 0.57 Tissue and other material debrided: Non-Viable, Slough, Slough Level: Non-Viable Tissue Debridement Description: Selective/Open Wound Instrument: Curette Bleeding: Minimum Hemostasis Achieved: Pressure End Time: 13:47 Response to Treatment: Procedure was tolerated well Level of Consciousness (Post- Awake and Alert procedure): Post Debridement Measurements of Total Wound Length: (cm) 0.8 Stage: Category/Stage IV Width: (cm) 0.9 Depth: (cm) 0.1 Volume: (cm) 0.057 Character of Wound/Ulcer Post Debridement: Improved Post Procedure Diagnosis Same as Pre-procedure Notes : Scribed for Dr. Lady Gary by Brenton Grills, RN Electronic Signature(s) Signed: 09/29/2022 2:34:04 PM By: Duanne Guess MD FACS Signed: 10/05/2022 3:48:27 PM By: Brenton Grills Entered By: Brenton Grills on 09/29/2022  13:49:53 -------------------------------------------------------------------------------- HPI Details Patient Name: Date of Service: Andrea Stacks [name] 09/29/2022 1:00 PM Medical Record Number: [id number] Patient Account Number: [id number] Date of Birth/Sex: Treating RN: [birth year] (81 y.o. 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 She is not diabetic and quit smoking over 40 years ago. 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 She has developed [name] pressure ulcer on the right medial malleolus. In addition, she apparently has had multiple cutaneous abscesses on her buttocks that have required repeated incision and drainage procedures. She has developed [name] large abscess on her right buttock that has become painful. She was recently treated with cephalexin by her PCP for urinary tract infection and also received [name] shot of Rocephin for the abscess, but it has not yet been incised  or drained. She is nonambulatory but is able to stand to transfer. She does not wear any sort of Prevalon boot. ABI in clinic today was 0.96. On her right medial malleolus, there is [name] circular ulcer. The surface is dry and fibrotic. There is some periwound erythema, but no malodor or purulent drainage. On her right buttock, there is [name] large fluctuant bulge about the size of an orange. It is also erythematous and tender. 12/04/2021: The culture from her abscess grew out staph. [name] 10-day course of Bactrim was prescribed and she is currently taking this. Unfortunately, Dakin's solution was not delivered and only 2 x 2 gauze was sent, rather than Kerlix so the packing of  the wound has been rather suboptimal. The wound cavity has light slough over all of the surfaces. Although covered, bone is palpable. The medial ankle wound looks about the same with [name] layer of slough accumulation. In addition, her daughter peeled some dry skin off of her foot this morning and she now has denuded areas over the majority of the plantar surface of her right foot. 01/01/2022: The patient has missed several visits and to her daughters report that it is somewhat difficult to get her here on [name] weekly basis. In the interim, she has developed [name] new pressure ulcer on her left lateral malleolus. The plantar surface of her right foot has healed. The cavity on her right buttock has contracted, but it remains quite deep and the trochanter, while not exposed, is palpable at the base. The pressure ulcer on her right medial malleolus is basically unchanged. 02/09/2022: All wounds are little bit smaller today. As the buttock abscess cavity has contracted, it has left some tunneling that has not been adequately packed. Light slough accumulation on both ankle wounds. Andrea [name], Andrea [name] ([id number]) [id number] Page 5 of 14 02/26/2022: All of the wounds continue to contract. There is slough on both ankle wounds. The buttock ulcer is clean. She brought her wound VAC with her today. 03/12/2022: The ankle wounds are about the same this week with slough accumulation. The buttock ulcer is smaller and quite clean. She decided that she could not tolerate the discomfort of the wound VAC and removed it. They have been packing the wound with Dakin's moistened gauze. 03/25/2022: The ankle wounds are slightly smaller. Both have slough accumulation. The orifice of the buttock ulcer has gotten quite [name] bit smaller than the underlying cavity. She has decided that she would like to have the wound VAC back. 3/14; patient presents for follow-up. She has bilateral ankle wounds and has been using Hydrofera  Blue to the these wound beds. She has Prevalon boots to help with offloading. She also has [name] sacral wound that we have been using [name] wound VAC for. She has no issues or complaints today. 04/29/2022: She once again decided she did not like the wound VAC and so they have been packing her gluteal wound with Dakin's moistened gauze. The gluteal ulcer seems to be about the same. Her left lateral ankle wound has gotten deeper and larger. The surface tissue is gray with thick slough. No significant change to the right medial ankle wound. 05/18/2022: The depth on the gluteal ulcer has come in [name] bit. There was [name] very strong pungent odor when the packing was removed, however. The right medial ankle wound is about the same size, but the tissue surface looks healthier. The left lateral ankle wound has reaccumulated the rubbery gray slough. 05/31/2022: The gluteal ulcer is shallower and  no longer has any dips or crevices. The odor has abated completely. The right medial ankle wound is unchanged. The left lateral ankle wound looks quite [name] bit healthier with [name] cleaner surface and the beginnings of granulation tissue emergence. 06/16/2022: The gluteal ulcer has the same measurements more or less, but overall the volume seems smaller, as the cavity feels tighter on examination. The ankle wounds are basically the same. 07/12/2022: The cavity of the buttock ulcer continues to contract. The left ankle wound has [name] little bit more granulation tissue, but bone remains exposed. The right ankle wound is flush with the surrounding skin. 07/21/2022: There is 1 deeper area in the buttock ulcer that I have not appreciated previously. I can feel bone under [name] layer of tissue. The rest of the cavity has contracted. Both ankle wounds are filling in with better granulation tissue. There is slough on both ankle wound surfaces. She struck her leg on her bed frame and has 2 new wounds on her right anterior tibial surface. Both have fat layer  exposure but no concern for infection. 08/02/2022: I do not feel bone as easily in the buttock ulcer. There is slough on the surface. The cavity seems to be contracting. Both ankle wounds have improved tissue quality and there is granulation tissue nearly completely covering the bone on the left lateral malleolus. The wounds on her right anterior tibial surface have dried up. 08/10/2022: The cavity at the buttock ulcer has contracted further. The depth remains about the same. The surface is cleaner this week. Both ankle wounds are little bit smaller today. They have rubbery slough on the surface which appears to be leftover Oasis. Underneath, the quality of the tissue continues to improve and there is no longer any bone exposed on the left lateral malleolus. 08/16/2022: The buttock ulcer cavity has contracted further and she has less tenderness at the deepest aspect of the wound. Both ankle wounds look smaller today, although they did not measure any differently. Minimal slough accumulation with fairly healthy-looking granulation tissue present. 08/24/2022: The buttock ulcer cavity is smaller again today. She no longer has the tenderness at the deepest aspect of the wound. Both ankle wounds measured about [name] millimeter smaller. There is healthy-looking granulation tissue present bilaterally with minimal slough on the left and no slough seen on the right. 08/31/2022: The cavity of the buttock ulcer continues to contract. It is about half [name] centimeter shallower than last week and there is no undermining. The cavity is also narrower and is more difficult to get an index finger into the wound. Both ankle wounds measured slightly smaller. Both have [name] rather dry skin edge but good granulation tissue present with minimal slough. 09/08/2022: The buttock ulcer cavity is smaller again today. It is shallower and narrower. Both ankle wounds were [name] little bit smaller, as well. There is [name] fair amount of slough on the left  lateral ankle wound. 09/14/2022: Unfortunately, the buttock ulcer cavity measured [name] little larger and [name] little deeper today. The patient did say she has been sitting [name] bit more of late. The right medial ankle wound is about the same size; the left lateral ankle wound is [name] little smaller and shallower. There is an area on the medial aspect of her right calcaneus that looks concerning for potential pressure-induced deep tissue injury. 09/29/2022: Both ankle wounds measured smaller today. Underneath the dried-on Oasis and slough, there is actually fairly healthy-looking granulation tissue filling in. The gluteal ulcer is narrower and tighter,  but the depth has not really decreased at all. Electronic Signature(s) Signed: 09/29/2022 2:01:40 PM By: Duanne Guess MD FACS Entered By: Duanne Guess on 09/29/2022 14:01:40 -------------------------------------------------------------------------------- Physical Exam Details Patient Name: Date of Service: Andrea [name] 09/29/2022 1:00 PM Medical Record Number: [id number] Patient Account Number: [id number] Date of Birth/Sex: Treating RN: 10-Sep-1940 (81 y.o. F) Primary Care Provider: Jarome Matin Other Clinician: Referring Provider: Treating Provider/Extender: Marena Chancy in Treatment: 44 Constitutional Hypertensive, asymptomatic. . . . no acute distress. Respiratory Normal work of breathing on room air. FEMALE, Andrea [name] ([id number]) [id number] Page 6 of 14 Notes 09/29/2022: Both ankle wounds measured smaller today. Underneath the dried-on Oasis and slough, there is actually fairly healthy-looking granulation tissue filling in. The gluteal ulcer is narrower and tighter, but the depth has not really decreased at all. Electronic Signature(s) Signed: 09/29/2022 2:15:29 PM By: Duanne Guess MD FACS Entered By: Duanne Guess on 09/29/2022  14:15:29 -------------------------------------------------------------------------------- Physician Orders Details Patient Name: Date of Service: Andrea Stacks [name] 09/29/2022 1:00 PM Medical Record Number: [id number] Patient Account Number: [id number] Date of Birth/Sex: Treating RN: 27-May-1940 (81 y.o. 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 - Dr. Lady Gary - Room 1 Return [name] Anesthetic (In clinic) Topical Lidocaine 4% applied to wound bed Cellular or Tissue Based Products Wound #1 Right,Medial Malleolus Cellular or Tissue Based Product Type: - oasis tri-layer #10 applied 09/29/22 Cellular or Tissue Based Product applied to wound bed, secured with steri-strips, cover with Adaptic or Mepitel. (DO NOT REMOVE). Wound #4 Left,Lateral Ankle Cellular or Tissue Based Product Type: - oasis tri-layer #9 Cellular or Tissue Based Product applied to wound bed, secured with steri-strips, cover with Adaptic or Mepitel. (DO NOT REMOVE). Bathing/ Shower/ Hygiene May shower with protection but do not get wound dressing(s) wet. Protect dressing(s) with water repellant cover (for example, large plastic bag) or [name] cast cover and may then take shower. Off-Loading Multipodus Splint to: - prevalon boot to both feet Turn and reposition every 2 hours Home Health No change in wound care orders  this week; continue Home Health for wound care. May utilize formulary equivalent dressing for wound treatment orders unless otherwise specified. - may chage outer dressing only on ankles, DO NOT remove sterostrips Dressing changes to be completed by Home Health on Monday / Wednesday / Friday except when patient has scheduled visit at [facility]. Other Home Health Orders/Instructions: - [facility] Wound Treatment Wound #1 - Malleolus Wound Laterality: Right, Medial Secondary Dressing: Sorbact Secured With: Kerlix Roll Sterile, 4.5x3.1 (in/yd) Discharge Instructions: Secure with Kerlix as directed. Andrea [name], Andrea [name] ([id number]) [id number] Page 7 of 14 Wound #2 - Gluteus Wound Laterality: Right Prim Dressing: Dakin's Solution 0.25%, 16 (oz) ary Discharge Instructions: Moisten gauze with Dakin's solution Prim Dressing: Promogran Prisma Matrix, 4.34 (sq in) (silver collagen) ary Discharge Instructions: Moisten collagen with saline or hydrogel Secondary Dressing: Zetuvit Plus Silicone Border Heel Dressing10x10 (in/in) Discharge Instructions: Apply silicone border over primary dressing as directed. Wound #4 - Ankle  Wound Laterality: Left, Lateral Secondary Dressing: Sorbact Secured With: Kerlix Roll Sterile, 4.5x3.1 (in/yd) Discharge Instructions: Secure with Kerlix as directed. Electronic Signature(s) Signed: 09/29/2022 2:34:04 PM By: Duanne Guess MD FACS Previous Signature: 09/29/2022 1:37:38 PM Version By: Duanne Guess MD FACS Entered By: Duanne Guess on 09/29/2022 14:16:06 -------------------------------------------------------------------------------- Problem List Details Patient Name: Date of Service: Andrea [name]. 09/29/2022 1:00 PM Medical Record Number: [id number] Patient Account Number: [id number] Date of Birth/Sex: Treating RN: 24-Dec-1940 (81 y.o. 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 without 01/01/2022 No Yes evidence of necrosis L02.31 Cutaneous abscess of buttock 11/24/2021 No Yes E27.40 Unspecified adrenocortical insufficiency 11/24/2021 No Yes Z79.52 Long term (current) use of systemic steroids 11/24/2021 No Yes I10 Essential (primary) hypertension 11/24/2021 No Yes Andrea [name], Andrea [name] ([id number]) [id number] Page 8 of 14 Inactive Problems ICD-10 Code Description Active Date Inactive Date L97.812 Non-pressure chronic ulcer of other part of right lower leg with fat layer exposed 07/21/2022 07/21/2022 Resolved Problems ICD-10 Code Description Active Date Resolved Date L97.511 Non-pressure chronic ulcer of other part of right foot limited to breakdown of skin 12/04/2021 12/04/2021 Electronic Signature(s) Signed: 09/29/2022 1:56:54 PM By: Duanne Guess MD FACS Entered By: Duanne Guess on 09/29/2022 13:56:54 -------------------------------------------------------------------------------- Progress Note Details Patient Name: Date of Service: Andrea [name]. 09/29/2022 1:00 PM Medical Record Number: [id number] Patient Account Number: [id number] Date of Birth/Sex: Treating RN: 28-Mar-1940 (81 y.o. 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 She is not diabetic and quit smoking over 40 years ago. 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 She has developed [name] pressure ulcer on the right medial malleolus. In addition, she apparently has had multiple cutaneous abscesses on her buttocks that have required repeated incision and drainage procedures. She has developed [name] large abscess on her right buttock that has become painful. She was recently treated with cephalexin by her PCP for urinary tract infection and also received [name] shot of Rocephin for the abscess, but it has not yet been incised or drained. She is nonambulatory but is able to stand to transfer. She does not wear any sort of Prevalon boot. ABI in clinic today was 0.96. On her right medial malleolus, there is [name] circular ulcer. The surface is dry and fibrotic. There is some periwound erythema, but no malodor or purulent drainage. On her right buttock, there is [name] large fluctuant bulge about the size of an orange. It is also erythematous and tender. 12/04/2021: The culture from her abscess grew out staph. [name] 10-day course of Bactrim was prescribed and she is currently taking this. Unfortunately, Dakin's solution was not delivered and only 2 x 2 gauze was sent, rather than Kerlix so the packing of  the wound has been rather suboptimal. The wound cavity has light slough over all of the surfaces. Although covered, bone is palpable. The medial ankle wound looks about the same with [name] layer of slough accumulation. In addition, her daughter peeled some dry skin off of her foot this morning and she now has denuded areas over the majority of the plantar surface of her right foot. 01/01/2022: The patient has missed several visits and to her daughters report that it is somewhat difficult to  get her here on [name] weekly basis. In the interim, she has developed [name] new pressure ulcer on her left lateral malleolus. The plantar surface of her right foot has healed. The cavity on her right buttock has contracted, but it remains quite deep and the trochanter, while not exposed, is palpable at the base. The pressure ulcer on her right medial malleolus is basically unchanged. 02/09/2022: All wounds are little bit smaller today. As the buttock abscess cavity has contracted, it has left some tunneling that has not been adequately packed. Light slough accumulation on both ankle wounds. 02/26/2022: All of the wounds continue to contract. There is slough on both ankle wounds. The buttock ulcer is clean. She brought her wound VAC with her today. 03/12/2022: The ankle wounds are about the same this week with slough accumulation. The buttock ulcer is smaller and quite clean. She decided that she could not tolerate the discomfort of the wound VAC and removed it. They have been packing the wound with Dakin's moistened gauze. 03/25/2022: The ankle wounds are slightly smaller. Both have slough accumulation. The orifice of the buttock ulcer has gotten quite [name] bit smaller than the underlying cavity. She has decided that she would like to have the wound VAC back. 3/14; patient presents for follow-up. She has bilateral ankle wounds and has been using Hydrofera Blue to the these wound beds. She has Prevalon boots to [place] [name] ([id number]) [id number] Page 9 of 14 help with offloading. She also has [name] sacral wound that we have been using [name] wound VAC for. She has no issues or complaints today. 04/29/2022: She once again decided she did not like the wound VAC and so they have been packing her gluteal wound with Dakin's moistened gauze. The gluteal ulcer seems to be about the same. Her left lateral ankle wound has gotten deeper and larger. The surface tissue is gray with thick slough. No  significant change to the right medial ankle wound. 05/18/2022: The depth on the gluteal ulcer has come in [name] bit. There was [name] very strong pungent odor when the packing was removed, however. The right medial ankle wound is about the same size, but the tissue surface looks healthier. The left lateral ankle wound has reaccumulated the rubbery gray slough. 05/31/2022: The gluteal ulcer is shallower and no longer has any dips or crevices. The odor has abated completely. The right medial ankle wound is unchanged. The left lateral ankle wound looks quite [name] bit healthier with [name] cleaner surface and the beginnings of granulation tissue emergence. 06/16/2022: The gluteal ulcer has the same measurements more or less, but overall the volume seems smaller, as the cavity feels tighter on examination. The ankle wounds are basically the same. 07/12/2022: The cavity of the buttock ulcer continues to contract. The left ankle wound has [name] little bit more granulation tissue, but bone remains exposed. The right ankle wound is flush with the surrounding skin. 07/21/2022: There is 1 deeper area in the buttock ulcer that I have  not appreciated previously. I can feel bone under [name] layer of tissue. The rest of the cavity has contracted. Both ankle wounds are filling in with better granulation tissue. There is slough on both ankle wound surfaces. She struck her leg on her bed frame and has 2 new wounds on her right anterior tibial surface. Both have fat layer exposure but no concern for infection. 08/02/2022: I do not feel bone as easily in the buttock ulcer. There is slough on the surface. The cavity seems to be contracting. Both ankle wounds have improved tissue quality and there is granulation tissue nearly completely covering the bone on the left lateral malleolus. The wounds on her right anterior tibial surface have dried up. 08/10/2022: The cavity at the buttock ulcer has contracted further. The depth remains about the same. The  surface is cleaner this week. Both ankle wounds are little bit smaller today. They have rubbery slough on the surface which appears to be leftover Oasis. Underneath, the quality of the tissue continues to improve and there is no longer any bone exposed on the left lateral malleolus. 08/16/2022: The buttock ulcer cavity has contracted further and she has less tenderness at the deepest aspect of the wound. Both ankle wounds look smaller today, although they did not measure any differently. Minimal slough accumulation with fairly healthy-looking granulation tissue present. 08/24/2022: The buttock ulcer cavity is smaller again today. She no longer has the tenderness at the deepest aspect of the wound. Both ankle wounds measured about [name] millimeter smaller. There is healthy-looking granulation tissue present bilaterally with minimal slough on the left and no slough seen on the right. 08/31/2022: The cavity of the buttock ulcer continues to contract. It is about half [name] centimeter shallower than last week and there is no undermining. The cavity is also narrower and is more difficult to get an index finger into the wound. Both ankle wounds measured slightly smaller. Both have [name] rather dry skin edge but good granulation tissue present with minimal slough. 09/08/2022: The buttock ulcer cavity is smaller again today. It is shallower and narrower. Both ankle wounds were [name] little bit smaller, as well. There is [name] fair amount of slough on the left lateral ankle wound. 09/14/2022: Unfortunately, the buttock ulcer cavity measured [name] little larger and [name] little deeper today. The patient did say she has been sitting [name] bit more of late. The right medial ankle wound is about the same size; the left lateral ankle wound is [name] little smaller and shallower. There is an area on the medial aspect of her right calcaneus that looks concerning for potential pressure-induced deep tissue injury. 09/29/2022: Both ankle wounds measured smaller  today. Underneath the dried-on Oasis and slough, there is actually fairly healthy-looking granulation tissue filling in. The gluteal ulcer is narrower and tighter, but the depth has not really decreased at all. Patient History Information obtained from Patient, Caregiver, Chart. Family History Cancer - Father,Maternal Grandparents,Siblings,Child, Heart Disease - Father, Hypertension - Father, Thyroid Problems - Mother, No family history of Diabetes, Hereditary Spherocytosis, Kidney Disease, Lung Disease, Seizures, Stroke, Tuberculosis. Social History Former smoker - quit 40 yr ago, Marital Status - Widowed, Alcohol Use - Never, Drug Use - No History, Caffeine Use - Daily - tea, soda. Medical History Eyes Patient has history of Cataracts - bil extracted Denies history of Glaucoma, Optic Neuritis Cardiovascular Patient has history of Arrhythmia - afib, Hypertension Endocrine Denies history of Type I Diabetes, Type II Diabetes Genitourinary Denies history of End Stage  Renal Disease Integumentary (Skin) Denies history of History of Burn Musculoskeletal Patient has history of Osteoarthritis, Osteomyelitis - pelvic Neurologic Patient has history of Neuropathy Oncologic Denies history of Received Chemotherapy, Received Radiation Psychiatric Patient has history of Confinement Anxiety Denies history of Anorexia/bulimia Hospitalization/Surgery History - TEE. - right ankle fusion. - bil knee replacements. - bil cataract extractions. - posterior lumbar fusion. - vaginal hysterectomy. - cholecystectomy. 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 no acute distress. Vitals Time Taken: 1:12 PM, Height: 59 in, Weight: 155 lbs, BMI: 31.3, Temperature: 97.6 F, Pulse: 81 bpm, Respiratory Rate: 16 breaths/min, Blood Pressure: 150/91 mmHg. Respiratory Normal work of breathing on room air. General Notes: 09/29/2022: Both ankle wounds measured smaller today. Underneath the dried-on Oasis and slough, there is actually fairly healthy-looking granulation tissue filling in. The gluteal ulcer is narrower and tighter, but the depth has not really decreased at all. Integumentary (Hair, Skin) Wound #1 status is Open. Original cause of wound was Pressure Injury. The date acquired was: 11/16/2021. The wound has been in treatment 44 weeks. The wound is located on the Right,Medial Malleolus. The wound measures 0.7cm length x 0.8cm width x 0.1cm depth; 0.44cm^2 area and 0.044cm^3 volume. There is Fat Layer (Subcutaneous Tissue) exposed. There is no tunneling or undermining noted. There is [name] medium amount of serosanguineous drainage noted. The wound margin is distinct with the outline attached to the wound base. There is small (1-33%) red granulation within the wound bed. There is [name] large (67-100%) amount of necrotic tissue within the wound bed including Eschar and Adherent Slough. The periwound skin appearance had no abnormalities noted for texture. The periwound skin appearance had no abnormalities noted for moisture. The periwound skin appearance did not exhibit: Erythema. Periwound temperature was noted as No Abnormality. Wound #2 status is Open. Original cause of wound was Bump. The date acquired was: 09/28/2021. The wound has been in treatment 44 weeks. The wound is located on the Right Gluteus. The wound measures 1cm length x 2cm width x 4.5cm depth; 1.571cm^2 area and 7.069cm^3 volume. There is Fat Layer (Subcutaneous Tissue) exposed. There is [name] medium amount of serous drainage noted. The wound margin  is epibole. There is large (67-100%) red granulation within the wound bed. There is [name] small (1-33%) amount of necrotic tissue within the wound bed including Adherent Slough. The periwound skin appearance had no abnormalities noted for texture. The periwound skin appearance had no abnormalities noted for moisture. The periwound skin appearance had no abnormalities noted for color. Periwound temperature was noted as No Abnormality. The periwound has tenderness on palpation. Wound #4 status is Open. Original cause of wound was Pressure Injury. The date acquired was: 12/23/2021. The wound has been in treatment 38 weeks. The wound is located on the Left,Lateral Ankle. The wound measures 0.8cm length x 0.9cm width x 0.1cm depth; 0.565cm^2 area and 0.057cm^3 volume. There is tendon and Fat Layer (Subcutaneous Tissue) exposed. There is [name] medium amount of serosanguineous drainage noted. The wound margin is distinct with the outline attached to the wound base. There is medium (34-66%) pink, pale granulation within the wound bed. There is [name] medium (34-66%) amount of necrotic tissue within the wound bed including Adherent Slough. The periwound skin appearance had no abnormalities noted  for texture. The periwound skin appearance had no abnormalities noted for moisture. The periwound skin appearance had no abnormalities noted for color. Periwound temperature was noted as No Abnormality. The periwound has tenderness on palpation. 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 There was [name]  Selective/Open Wound Non-Viable Tissue Debridement with [name] total area of 0.44 sq cm performed by Duanne Guess, MD. With the following instrument(s): Curette to remove Non-Viable tissue/material. Material removed includes [facility] after achieving pain control using Lidocaine 4% Topical Solution. No specimens were taken. [name] time out was conducted at 13:44, prior to the start of the procedure. [name] Minimum amount of bleeding was controlled with Pressure. The procedure was tolerated well. Post Debridement Measurements: 0.7cm length x 0.8cm width x 0.1cm depth; 0.044cm^3 volume. Post debridement Stage noted as Category/Stage III. Character of Wound/Ulcer Post Debridement is improved. Post procedure Diagnosis Wound #1: Same as Pre-Procedure Andrea [name], Andrea [name] ([id number]) [id number] Page 11 of 14 General Notes: : Scribed for Dr. Lady Gary by Brenton Grills, RN. Pre-procedure diagnosis of Wound #1 is [name] Pressure Ulcer located on the Right,Medial Malleolus. [name] skin graft procedure using [name] bioengineered skin substitute/cellular or tissue based product was performed by Duanne Guess, MD with the following instrument(s): Curette. Oasis wound matrix was applied and secured with Steri-Strips. 5.5 sq cm of product was utilized and 0 sq cm was wasted. Post Application, Sorbact was applied. [name] Time Out was conducted at 13:55, prior to the start of the procedure. The procedure was tolerated well with [name] pain level of 0 throughout and [name] pain level of 0 following the procedure. Post procedure Diagnosis Wound #1: Same as Pre-Procedure General Notes: : Scribed for Dr. Lady Gary by Brenton Grills, RN. 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 There was [name] Selective/Open Wound Non-Viable Tissue Debridement with [name] total area of 0.57 sq cm performed by Duanne Guess, MD. With the following instrument(s): Curette to remove Non-Viable tissue/material.  Material removed includes [facility] after achieving pain control using Lidocaine 4% Topical Solution. No specimens were taken. [name] time out was conducted at 13:44, prior to the start of the procedure. [name] Minimum amount of bleeding was controlled with Pressure. The procedure was tolerated well. Post Debridement Measurements: 0.8cm length x 0.9cm width x 0.1cm depth; 0.057cm^3 volume. Post debridement Stage noted as Category/Stage IV. Character of Wound/Ulcer Post Debridement is improved. Post procedure Diagnosis Wound #4: Same as Pre-Procedure General Notes: : Scribed for Dr. Lady Gary by Brenton Grills, RN. Pre-procedure diagnosis of Wound #4 is [name] Pressure Ulcer located on the Left,Lateral Ankle. [name] skin graft procedure using [name] bioengineered skin substitute/cellular or tissue based product was performed by Duanne Guess, MD with the following instrument(s): Curette. Oasis wound matrix was applied and secured with Steri-Strips. 5.5 sq cm of product was utilized and 0 sq cm was wasted. Post Application, Sorbact was applied. [name] Time Out was conducted at 13:55, prior to the start of the procedure. The procedure was tolerated well with [name] pain level of 0 throughout and [name] pain level of 0 following the procedure. Post procedure Diagnosis Wound #4: Same as Pre-Procedure General Notes: :  Scribed for Dr. Lady Gary by Brenton Grills, RN. Plan Follow-up Appointments: Return Appointment in 1 week. - Dr. Lady Gary - Room 1 Anesthetic: (In clinic) Topical Lidocaine 4% applied to wound bed Cellular or Tissue Based Products: Wound #1 Right,Medial Malleolus: Cellular or Tissue Based Product Type: - oasis tri-layer #10 applied 09/29/22 Cellular or Tissue Based Product applied to wound bed, secured with steri-strips, cover with Adaptic or Mepitel. (DO NOT REMOVE). Wound #4 Left,Lateral Ankle: Cellular or Tissue Based Product Type: - oasis tri-layer #9 Cellular or Tissue Based Product applied to wound bed, secured with steri-strips,  cover with Adaptic or Mepitel. (DO NOT REMOVE). Bathing/ Shower/ Hygiene: May shower with protection but do not get wound dressing(s) wet. Protect dressing(s) with water repellant cover (for example, large plastic bag) or [name] cast cover and may then take shower. Off-Loading: Multipodus Splint to: - prevalon boot to both feet Turn and reposition every 2 hours Home Health: No change in wound care orders this week; continue Home Health for wound care. May utilize formulary equivalent dressing for wound treatment orders unless otherwise specified. - may chage outer dressing only on ankles, DO NOT remove sterostrips Dressing changes to be completed by Home Health on Monday / Wednesday / Friday except when patient has scheduled visit at [facility]. Other Home Health Orders/Instructions: - [facility] WOUND #1: - Malleolus Wound Laterality: Right, Medial Secondary Dressing: Sorbact Secured With: Kerlix Roll Sterile, 4.5x3.1 (in/yd) Discharge Instructions: Secure with Kerlix as directed. WOUND #2: - Gluteus Wound Laterality: Right Prim Dressing: Dakin's Solution 0.25%, 16 (oz) ary Discharge Instructions: Moisten gauze with Dakin's solution Prim Dressing: Promogran Prisma Matrix, 4.34 (sq in) (silver collagen) ary Discharge Instructions: Moisten collagen with saline or hydrogel Secondary Dressing: Zetuvit Plus Silicone Border Heel Dressing10x10 (in/in) Discharge Instructions: Apply silicone border over primary dressing as directed. WOUND #4: - Ankle Wound Laterality: Left, Lateral Secondary Dressing: Sorbact Secured With: Kerlix Roll Sterile, 4.5x3.1 (in/yd) Discharge Instructions: Secure with Kerlix as directed. 09/29/2022: Both ankle wounds measured smaller today. Underneath the dried-on Oasis and slough, there is actually fairly healthy-looking granulation tissue filling in. The gluteal ulcer is narrower and tighter, but the depth has not really decreased at all. The gluteal ulcer did not  require any debridement. We will continue to pack the wound with Dakin's-moistened gauze. I did suggest to the patient that the wound would likely close more quickly with negative pressure wound therapy, but she has had this in the past and did not tolerate it. I used [name] curette to debride eschar and slough, as well as dry Oasis from both of her ankle wounds. Oasis Tri layer was rehydrated and applied to each ankle site in standard fashion. It was secured with Sorbact and Steri-Strips. This is her final Oasis application. I am pleased with the response. Follow-up in 1 week. Electronic Signature(s) Signed: 09/29/2022 2:19:13 PM By: Duanne Guess MD FACS Previous Signature: 09/29/2022 2:17:35 PM Version By: Duanne Guess MD FACS Entered By: Duanne Guess on 09/29/2022 14:19:13 Andrea [name] ([id number]) [id number] Page 12 of 14 -------------------------------------------------------------------------------- HxROS Details Patient Name: Date of Service: Andrea [name] 09/29/2022 1:00 PM Medical Record Number: [id number] Patient Account Number: [id number] Date of Birth/Sex: Treating RN: [birth year] (82 y.o. F) Primary Care Provider: Jarome Matin Other Clinician: Referring Provider: Treating Provider/Extender: Marena Chancy in Treatment: 44 Information Obtained From Patient Caregiver Chart Constitutional Symptoms (General Health) Medical History: Past Medical History Notes: morbid obesity Eyes Medical History: Positive for: Cataracts - bil  extracted Negative for: Glaucoma; Optic Neuritis Ear/Nose/Mouth/Throat Medical History: Past Medical History Notes: hard of hearing Cardiovascular Medical History: Positive for: Arrhythmia - afib; Hypertension Gastrointestinal Medical History: Past Medical History Notes: GERD Endocrine Medical History: Negative for: Type I Diabetes; Type II Diabetes Past Medical History  Notes: adrenal insufficiency Genitourinary Medical History: Negative for: End Stage Renal Disease Integumentary (Skin) Medical History: Negative for: History of Burn Musculoskeletal Medical History: Positive for: Osteoarthritis; Osteomyelitis - pelvic Past Medical History Notes: psoriatic arthritis, thoracic discitis, displaced spiral fx right tibia Neurologic Medical History: Positive for: Neuropathy Past Medical History Notes: hx SAH Andrea [name], Andrea [name] ([id number]) [id number] Page 13 of 14 Oncologic Medical History: Negative for: Received Chemotherapy; Received Radiation Psychiatric Medical History: Positive for: Confinement Anxiety Negative for: Anorexia/bulimia HBO Extended History Items Eyes: Cataracts Immunizations Pneumococcal Vaccine: Received Pneumococcal Vaccination: Yes Received Pneumococcal Vaccination On or After 60th Birthday: Yes Implantable Devices None Hospitalization / Surgery History Type of Hospitalization/Surgery TEE right ankle fusion bil knee replacements bil cataract extractions posterior lumbar fusion vaginal hysterectomy cholecystectomy Family and Social History Cancer: Yes - Father,Maternal Grandparents,Siblings,Child; Diabetes: No; Heart Disease: Yes - Father; Hereditary Spherocytosis: No; Hypertension: Yes - Father; Kidney Disease: No; Lung Disease: No; Seizures: No; Stroke: No; Thyroid Problems: Yes - Mother; Tuberculosis: No; Former smoker - quit 40 yr ago; Marital Status - Widowed; Alcohol Use: Never; Drug Use: No History; Caffeine Use: Daily - tea, soda; Financial Concerns: No; Food, Clothing or Shelter Needs: No; Support System Lacking: No; Transportation Concerns: No Electronic Signature(s) Signed: 09/29/2022 2:34:04 PM By: Duanne Guess MD FACS Entered By: Duanne Guess on 09/29/2022 14:01:48 -------------------------------------------------------------------------------- SuperBill Details Patient  Name: Date of Service: Andrea [name] 09/29/2022 Medical Record Number: [id number] Patient Account Number: [id number] Date of Birth/Sex: Treating RN: 05/18/1940 (81 y.o. 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 L02.31 Cutaneous abscess of buttock E27.40 Unspecified adrenocortical insufficiency Z79.52 Long term (current) use of systemic steroids I10 Essential (primary) hypertension VERBA, AINLEY [name] ([id number]) [id number] Page 14 of 14 Facility Procedures : CPT4: Code 02542706 Description: Q4102 -Oasis (Wound Matrix) per sq cm Modifier: Quantity: 11 : CPT4: 23762831 Description: D1761 Application of skin substitute graft to trunk, arms, legs, total wound surface area up to 100 sq cm; first 25 sqcm or less wound surface area ICD-10 Diagnosis Description 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, stage 3 L89.523 Pressure  ulcer of left ankle, stage 3 Modifier: Quantity: 1 Physician Procedures : CPT4: Description Modifier Code [id number] - WC PHYS LEVEL 4 - EST PT 25 ICD-10 Diagnosis Description 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, stage 3 L89.523 Pressure ulcer of left ankle, stage 3 L98.415 Non-pressure chronic ulcer of buttock with  muscle involvement without evidence of necrosis Z79.52 Long term (current) use of systemic steroids Quantity: 1 : CPT4: C5271 Application of skin substitute graft to trunk, arms, legs, total wound surface area up to 100 sq cm; first 25 sqcm or less wound surface area ICD-10 Diagnosis Description 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, stage 3 L89.523 Pressure ulcer   of left ankle, stage 3 Quantity: 1 Electronic Signature(s) Signed: 10/26/2022 8:35:46 AM By: Pearletha Alfred Signed: 10/26/2022 3:13:40 PM By: Duanne Guess MD FACS Previous Signature: 09/29/2022 2:19:52 PM  Version By: Duanne Guess MD FACS Previous Signature: 09/29/2022 2:18:17 PM Version By: Duanne Guess MD FACS Entered By: Pearletha Alfred on 10/26/2022 08:35:45

## 2022-10-05 NOTE — Progress Notes (Signed)
Andrea, [name] [name] ([id number]) [id number] Page 1 of 12 Visit Report for 10/05/2022 Chief Complaint Document Details Patient Name: Date of Service: Andrea [name] 10/05/2022 1:15 PM Medical Record Number: [id number] Patient Account Number: [id number] Date of Birth/Sex: Treating RN: 02-10-1940 (82 y.o. [name] Primary Care Provider: Jarome Matin Other Clinician: Referring Provider: Treating Provider/Extender: Marena Chancy in Treatment: 45 Information Obtained from: Patient Chief Complaint Patient is at the clinic for treatment of an open pressure ulcer on her right medial ankle, and [name] large abscess on her right buttock Electronic Signature(s) Signed: 10/05/2022 2:14:30 PM By: Duanne Guess MD FACS Entered By: Duanne Guess on 10/05/2022 11:14:30 -------------------------------------------------------------------------------- Debridement Details Patient Name: Date of Service: Andrea [name] 10/05/2022 1:15 PM Medical Record Number: [id number] Patient Account Number: [id number] Date of Birth/Sex: Treating RN: 20-Feb-1940 (81 y.o. [name] Primary Care Provider: Jarome Matin Other Clinician: Referring Provider: Treating Provider/Extender: Marena Chancy in Treatment: 45 Debridement Performed for Assessment: Wound #1 Right,Medial Malleolus Performed By: Physician Duanne Guess, MD The following information was scribed by: Zenaida Deed The information was scribed for: Duanne Guess Debridement Type: Debridement Level of Consciousness (Pre-procedure): Awake and Alert Pre-procedure Verification/Time Out Yes - 14:00 Taken: Start Time: 14:00 Pain Control: Lidocaine 4% Topical Solution Percent of Wound Bed Debrided: 100% T Area Debrided (cm): otal 0.49 Tissue and other material debrided: Non-Viable, Eschar, Slough, Slough Level: Non-Viable Tissue Debridement  Description: Selective/Open Wound Instrument: Curette Bleeding: Minimum Hemostasis Achieved: Pressure Procedural Pain: 0 Post Procedural Pain: 0 Response to Treatment: Procedure was tolerated well Level of Consciousness (Post- Awake and Alert procedure): Post Debridement Measurements of Total Wound Length: (cm) 0.9 Stage: Category/Stage III Width: (cm) 0.7 Depth: (cm) 0.1 Volume: (cm) 0.049 Character of Wound/Ulcer Post Debridement: Improved Andrea [name], Andrea [name] ([id number]) [id number] Page 2 of 12 Post Procedure Diagnosis Same as Pre-procedure Electronic Signature(s) Signed: 10/05/2022 3:58:21 PM By: Duanne Guess MD FACS Signed: 10/05/2022 4:43:06 PM By: Zenaida Deed RN, BSN Entered By: Zenaida Deed on 10/05/2022 11:03:50 -------------------------------------------------------------------------------- Debridement Details Patient Name: Date of Service: Andrea [name] 10/05/2022 1:15 PM Medical Record Number: [id number] Patient Account Number: [id number] Date of Birth/Sex: Treating RN: Sep 23, 1940 (81 y.o. [name] Primary Care Provider: Jarome Matin Other Clinician: Referring Provider: Treating Provider/Extender: Marena Chancy in Treatment: 45 Debridement Performed for Assessment: Wound #4 Left,Lateral Ankle Performed By: Physician Duanne Guess, MD The following information was scribed by: Zenaida Deed The information was scribed for: Duanne Guess Debridement Type: Debridement Level of Consciousness (Pre-procedure): Awake and Alert Pre-procedure Verification/Time Out Yes - 14:00 Taken: Start Time: 14:00 Pain Control: Lidocaine 4% T opical Solution Percent of Wound Bed Debrided: 100% T Area Debrided (cm): otal 0.5 Tissue and other material debrided: Non-Viable, Slough, Slough Level: Non-Viable Tissue Debridement Description: Selective/Open Wound Instrument: Curette Bleeding:  Minimum Hemostasis Achieved: Pressure Procedural Pain: 0 Post Procedural Pain: 0 Response to Treatment: Procedure was tolerated well Level of Consciousness (Post- Awake and Alert procedure): Post Debridement Measurements of Total Wound Length: (cm) 0.8 Stage: Category/Stage IV Width: (cm) 0.8 Depth: (cm) 0.2 Volume: (cm) 0.101 Character of Wound/Ulcer Post Debridement: Improved Post Procedure Diagnosis Same as Pre-procedure Electronic Signature(s) Signed: 10/05/2022 3:58:21 PM By: Duanne Guess MD FACS Signed: 10/05/2022 4:43:06 PM By: Zenaida Deed RN, BSN Entered By: Zenaida Deed on 10/05/2022 11:04:41 HPI Details -------------------------------------------------------------------------------- Andrea [name] ([id number]) [id number] Page 3 of 12 Patient Name: Date of Service: [name], [name]  [name] 10/05/2022 1:15 PM Medical Record Number: [id number] Patient Account Number: [id number] Date of Birth/Sex: Treating RN: April 24, 1940 (82 y.o. 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 She is not diabetic and quit smoking over 40 years ago. 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 She has developed [name] pressure ulcer on the right medial malleolus. In addition, she apparently has had multiple cutaneous abscesses on her buttocks that have required repeated incision and drainage procedures. She has developed [name] large abscess on her right buttock that has become painful. She was recently treated with cephalexin by her PCP for urinary tract infection and also received [name] shot of Rocephin for  the abscess, but it has not yet been incised or drained. She is nonambulatory but is able to stand to transfer. She does not wear any sort of Prevalon boot. ABI in clinic today was 0.96. On her right medial malleolus, there is [name] circular ulcer. The surface is dry and fibrotic. There is some periwound erythema, but no malodor or purulent drainage. On her right buttock, there is [name] large fluctuant bulge about the size of an orange. It is also erythematous and tender. 12/04/2021: The culture from her abscess grew out staph. [name] 10-day course of Bactrim was prescribed and she is currently taking this. Unfortunately, Dakin's solution was not delivered and only 2 x 2 gauze was sent, rather than Kerlix so the packing of the wound has been rather suboptimal. The wound cavity has light slough over all of the surfaces. Although covered, bone is palpable. The medial ankle wound looks about the same with [name] layer of slough accumulation. In addition, her daughter peeled some dry skin off of her foot this morning and she now has denuded areas over the majority of the plantar surface of her right foot. 01/01/2022: The patient has missed several visits and to her daughters report that it is somewhat difficult to get her here on [name] weekly basis. In the interim, she has developed [name] new pressure ulcer on her left lateral malleolus. The plantar surface of her right foot has healed. The cavity on her right buttock has contracted, but it remains quite deep and the trochanter, while not exposed, is palpable at the base. The pressure ulcer on her right medial malleolus is basically unchanged. 02/09/2022: All wounds are little bit smaller today. As the buttock abscess cavity has contracted, it has left some tunneling that has not been adequately packed. Light slough accumulation on both ankle wounds. 02/26/2022: All of the wounds continue to contract. There is slough on both ankle wounds. The buttock ulcer is clean. She brought her  wound VAC with her today. 03/12/2022: The ankle wounds are about the same this week with slough accumulation. The buttock ulcer is smaller and quite clean. She decided that she could not tolerate the discomfort of the wound VAC and removed it. They have been packing the wound with Dakin's moistened gauze. 03/25/2022: The ankle wounds are slightly smaller. Both have slough accumulation. The orifice of the buttock ulcer has gotten quite [name] bit smaller than the underlying cavity. She has decided that she would like to have the wound VAC back. 3/14; patient presents for follow-up. She has  bilateral ankle wounds and has been using Hydrofera Blue to the these wound beds. She has Prevalon boots to help with offloading. She also has [name] sacral wound that we have been using [name] wound VAC for. She has no issues or complaints today. 04/29/2022: She once again decided she did not like the wound VAC and so they have been packing her gluteal wound with Dakin's moistened gauze. The gluteal ulcer seems to be about the same. Her left lateral ankle wound has gotten deeper and larger. The surface tissue is gray with thick slough. No significant change to the right medial ankle wound. 05/18/2022: The depth on the gluteal ulcer has come in [name] bit. There was [name] very strong pungent odor when the packing was removed, however. The right medial ankle wound is about the same size, but the tissue surface looks healthier. The left lateral ankle wound has reaccumulated the rubbery gray slough. 05/31/2022: The gluteal ulcer is shallower and no longer has any dips or crevices. The odor has abated completely. The right medial ankle wound is unchanged. The left lateral ankle wound looks quite [name] bit healthier with [name] cleaner surface and the beginnings of granulation tissue emergence. 06/16/2022: The gluteal ulcer has the same measurements more or less, but overall the volume seems smaller, as the cavity feels tighter on examination. The ankle wounds  are basically the same. 07/12/2022: The cavity of the buttock ulcer continues to contract. The left ankle wound has [name] little bit more granulation tissue, but bone remains exposed. The right ankle wound is flush with the surrounding skin. 07/21/2022: There is 1 deeper area in the buttock ulcer that I have not appreciated previously. I can feel bone under [name] layer of tissue. The rest of the cavity has contracted. Both ankle wounds are filling in with better granulation tissue. There is slough on both ankle wound surfaces. She struck her leg on her bed frame and has 2 new wounds on her right anterior tibial surface. Both have fat layer exposure but no concern for infection. 08/02/2022: I do not feel bone as easily in the buttock ulcer. There is slough on the surface. The cavity seems to be contracting. Both ankle wounds have improved tissue quality and there is granulation tissue nearly completely covering the bone on the left lateral malleolus. The wounds on her right anterior tibial surface have dried up. 08/10/2022: The cavity at the buttock ulcer has contracted further. The depth remains about the same. The surface is cleaner this week. Both ankle wounds are little bit smaller today. They have rubbery slough on the surface which appears to be leftover Oasis. Underneath, the quality of the tissue continues to improve and there is no longer any bone exposed on the left lateral malleolus. 08/16/2022: The buttock ulcer cavity has contracted further and she has less tenderness at the deepest aspect of the wound. Both ankle wounds look smaller today, although they did not measure any differently. Minimal slough accumulation with fairly healthy-looking granulation tissue present. 08/24/2022: The buttock ulcer cavity is smaller again today. She no longer has the tenderness at the deepest aspect of the wound. Both ankle wounds measured about [name] millimeter smaller. There is healthy-looking granulation tissue present  bilaterally with minimal slough on the left and no slough seen on the right. 08/31/2022: The cavity of the buttock ulcer continues to contract. It is about half [name] centimeter shallower than last week and there is no undermining. The cavity is also narrower and is more difficult to  get an index finger into the wound. Both ankle wounds measured slightly smaller. Both have [name] rather dry skin edge but good granulation tissue present with minimal slough. 09/08/2022: The buttock ulcer cavity is smaller again today. It is shallower and narrower. Both ankle wounds were [name] little bit smaller, as well. There is [name] fair amount of slough on the left lateral ankle wound. 09/14/2022: Unfortunately, the buttock ulcer cavity measured [name] little larger and [name] little deeper today. The patient did say she has been sitting [name] bit more of late. The right medial ankle wound is about the same size; the left lateral ankle wound is [name] little smaller and shallower. There is an area on the medial aspect of her Andrea [name], Andrea [name] ([id number]) [id number] Page 4 of 12 right calcaneus that looks concerning for potential pressure-induced deep tissue injury. 09/29/2022: Both ankle wounds measured smaller today. Underneath the dried-on Oasis and slough, there is actually fairly healthy-looking granulation tissue filling in. The gluteal ulcer is narrower and tighter, but the depth has not really decreased at all. 10/05/2022: Once again, both ankle wounds measured slightly smaller. The tissue surfaces look healthy. The gluteal ulcer continues to contract circumferentially, but the depth remains stable. Electronic Signature(s) Signed: 10/05/2022 2:15:22 PM By: Duanne Guess MD FACS Entered By: Duanne Guess on 10/05/2022 11:15:22 -------------------------------------------------------------------------------- Physical Exam Details Patient Name: Date of Service: Andrea [name] 10/05/2022 1:15 PM Medical Record Number:  [id number] Patient Account Number: [id number] Date of Birth/Sex: Treating RN: [birth year] (82 y.o. [name] Primary Care Provider: Jarome Matin Other Clinician: Referring Provider: Treating Provider/Extender: Marena Chancy in Treatment: 45 Constitutional Hypertensive, asymptomatic. . . . no acute distress. Respiratory Normal work of breathing on room air. Notes 10/05/2022: Once again, both ankle wounds measured slightly smaller. The tissue surfaces look healthy. The gluteal ulcer continues to contract circumferentially, but the depth remains stable. Electronic Signature(s) Signed: 10/05/2022 2:15:59 PM By: Duanne Guess MD FACS Entered By: Duanne Guess on 10/05/2022 11:15:59 -------------------------------------------------------------------------------- Physician Orders Details Patient Name: Date of Service: Andrea [name] 10/05/2022 1:15 PM Medical Record Number: [id number] Patient Account Number: [id number] Date of Birth/Sex: Treating RN: 1940/03/19 (81 y.o. 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 - Dr. Lady Gary - Room 1 Return  [name] Tuesday 9/17 @ 1:15 pm Anesthetic (In clinic) Topical Lidocaine 4% applied to wound bed Bathing/ Shower/ Hygiene May shower and wash wound with soap and water. - with dressing changes Off-Loading Multipodus Splint to: - prevalon boot to both feet Turn and reposition every 2 hours Home Health New wound care orders this week; continue Home Health for wound care. May utilize formulary equivalent dressing for wound treatment orders unless otherwise specified. Dressing changes to be completed by Home Health on Monday / Wednesday / Friday except when patient has scheduled visit at [facility]. Other Home Health Orders/Instructions: -  [facility] Wound Treatment Wound #1 - Malleolus Wound Laterality: Right, Medial Prim Dressing: Endoform 2x2 in 3 x Per Week/30 Days ary Discharge Instructions: Moisten with saline Secondary Dressing: Woven Gauze Sponge, Non-Sterile 4x4 in 3 x Per Week/30 Days Discharge Instructions: Apply over primary dressing as directed. Secured With: [employer], 4.5x3.1 (in/yd) 3 x Per Week/30 Days Discharge Instructions: Secure with Kerlix as directed. Wound #2 - Gluteus Wound Laterality: Right Prim Dressing: Dakin's Solution 0.25%, 16 (oz) 1 x Per Day/30 Days ary Discharge Instructions: Moisten gauze with Dakin's solution Prim Dressing: Promogran Prisma Matrix, 4.34 (sq in) (silver collagen) 1 x Per Day/30 Days ary Discharge Instructions: Moisten collagen with saline or hydrogel Secondary Dressing: Zetuvit Plus Silicone Border Heel Dressing10x10 (in/in) 1 x Per Day/30 Days Discharge Instructions: Apply silicone border over primary dressing as directed. Wound #4 - Ankle Wound Laterality: Left, Lateral Prim Dressing: Endoform 2x2 in 3 x Per Week/30 Days ary Discharge Instructions: Moisten with saline Secondary Dressing: Woven Gauze Sponge, Non-Sterile 4x4 in 3 x Per Week/30 Days Discharge Instructions: Apply over primary dressing as directed. Secured  With: [employer], 4.5x3.1 (in/yd) 3 x Per Week/30 Days Discharge Instructions: Secure with Kerlix as directed. Electronic Signature(s) Signed: 10/05/2022 3:58:21 PM By: Duanne Guess MD FACS Entered By: Duanne Guess on 10/05/2022 11:18:38 -------------------------------------------------------------------------------- Problem List Details Patient Name: Date of Service: Andrea [name] 10/05/2022 1:15 PM Medical Record Number: [id number] Patient Account Number: [id number] Date of Birth/Sex: Treating RN: 20-Jul-1940 (81 y.o. 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 without 01/01/2022 No Yes evidence of necrosis L02.31 Cutaneous abscess of buttock 11/24/2021 No Yes E27.40 Unspecified adrenocortical insufficiency 11/24/2021 No Yes Z79.52 Long term (current) use of systemic steroids 11/24/2021 No Yes I10 Essential (primary) hypertension 11/24/2021 No Yes Inactive Problems ICD-10 Code Description Active Date Inactive Date L97.812 Non-pressure chronic ulcer of other part of right lower leg with fat layer exposed 07/21/2022 07/21/2022 Resolved Problems ICD-10 Code Description Active Date Resolved Date L97.511 Non-pressure chronic ulcer of other part of right foot limited to breakdown of skin 12/04/2021 12/04/2021 Electronic Signature(s) Signed: 10/05/2022 2:12:35 PM By: Duanne Guess MD FACS Entered By: Duanne Guess on 10/05/2022  11:12:35 -------------------------------------------------------------------------------- Progress Note Details Patient Name: Date of Service: Andrea [name] 10/05/2022 1:15 PM Medical Record Number: [id number] Patient Account Number: [id number] Date of Birth/Sex: Treating RN: 04-May-1940 (82 y.o. [name] Primary Care Provider: Jarome Matin Other Clinician: Referring Provider: Treating Provider/Extender: Marena Chancy in Treatment: [address] 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 She is not diabetic and quit smoking over 40 years ago. 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 She has developed [name] pressure ulcer on the right  medial malleolus. In addition, she apparently has had multiple cutaneous abscesses on her buttocks that have required repeated incision and drainage procedures. She has developed [name] large abscess on her right buttock that has become painful. She was recently treated with cephalexin by her PCP for urinary tract infection and also received [name] shot of Rocephin for the abscess, but it has not yet been incised or drained. She is nonambulatory but is able to stand to transfer. She does not wear any sort of Prevalon boot. ABI in clinic today was 0.96. On her right medial malleolus, there is [name] circular ulcer. The surface is dry and fibrotic. There is some periwound erythema, but no malodor or purulent drainage. On her right buttock, there is [name] large fluctuant bulge about the size of an  orange. It is also erythematous and tender. 12/04/2021: The culture from her abscess grew out staph. [name] 10-day course of Bactrim was prescribed and she is currently taking this. Unfortunately, Dakin's solution was not delivered and only 2 x 2 gauze was sent, rather than Kerlix so the packing of the wound has been rather suboptimal. The wound cavity has light slough over all of the surfaces. Although covered, bone is palpable. The medial ankle wound looks about the same with [name] layer of slough accumulation. In addition, her daughter peeled some dry skin off of her foot this morning and she now has denuded areas over the majority of the plantar surface of her right foot. 01/01/2022: The patient has missed several visits and to her daughters report that it is somewhat difficult to get her here on [name] weekly basis. In the interim, she has developed [name] new pressure ulcer on her left lateral malleolus. The plantar surface of her right foot has healed. The cavity on her right buttock has contracted, but it remains quite deep and the trochanter, while not exposed, is palpable at the base. The pressure ulcer on her right medial malleolus is basically unchanged. 02/09/2022: All wounds are little bit smaller today. As the buttock abscess cavity has contracted, it has left some tunneling that has not been adequately packed. Light slough accumulation on both ankle wounds. 02/26/2022: All of the wounds continue to contract. There is slough on both ankle wounds. The buttock ulcer is clean. She brought her wound VAC with her today. 03/12/2022: The ankle wounds are about the same this week with slough accumulation. The buttock ulcer is smaller and quite clean. She decided that she could not tolerate the discomfort of the wound VAC and removed it. They have been packing the wound with Dakin's moistened gauze. 03/25/2022: The ankle wounds are slightly smaller. Both have slough accumulation. The orifice of the buttock ulcer has  gotten quite [name] bit smaller than the underlying cavity. She has decided that she would like to have the wound VAC back. 3/14; patient presents for follow-up. She has bilateral ankle wounds and has been using Hydrofera Blue to the these wound beds. She has Prevalon boots to help with offloading. She also has [name] sacral wound that we have been using [name] wound VAC for. She has no issues or complaints today. 04/29/2022: She once again decided she did not like the wound VAC and so they have been packing her gluteal wound with Dakin's moistened gauze. The gluteal ulcer seems to be about the same. Her left lateral ankle wound has gotten deeper and larger. The surface tissue is gray with thick slough. No significant change to the right medial ankle wound. 05/18/2022: The  depth on the gluteal ulcer has come in [name] bit. There was [name] very strong pungent odor when the packing was removed, however. The right medial ankle wound is about the same size, but the tissue surface looks healthier. The left lateral ankle wound has reaccumulated the rubbery gray slough. 05/31/2022: The gluteal ulcer is shallower and no longer has any dips or crevices. The odor has abated completely. The right medial ankle wound is unchanged. The left lateral ankle wound looks quite [name] bit healthier with [name] cleaner surface and the beginnings of granulation tissue emergence. 06/16/2022: The gluteal ulcer has the same measurements more or less, but overall the volume seems smaller, as the cavity feels tighter on examination. The ankle wounds are basically the same. 07/12/2022: The cavity of the buttock ulcer continues to contract. The left ankle wound has [name] little bit more granulation tissue, but bone remains exposed. The right ankle wound is flush with the surrounding skin. 07/21/2022: There is 1 deeper area in the buttock ulcer that I have not appreciated previously. I can feel bone under [name] layer of tissue. The rest of the cavity has contracted. Both ankle  wounds are filling in with better granulation tissue. There is slough on both ankle wound surfaces. She struck her leg on her bed frame and has 2 new wounds on her right anterior tibial surface. Both have fat layer exposure but no concern for infection. 08/02/2022: I do not feel bone as easily in the buttock ulcer. There is slough on the surface. The cavity seems to be contracting. Both ankle wounds have improved tissue quality and there is granulation tissue nearly completely covering the bone on the left lateral malleolus. The wounds on her right anterior tibial surface have dried up. 08/10/2022: The cavity at the buttock ulcer has contracted further. The depth remains about the same. The surface is cleaner this week. Both ankle wounds are little bit smaller today. They have rubbery slough on the surface which appears to be leftover Oasis. Underneath, the quality of the tissue continues to improve and there is no longer any bone exposed on the left lateral malleolus. 08/16/2022: The buttock ulcer cavity has contracted further and she has less tenderness at the deepest aspect of the wound. Both ankle wounds look smaller today, although they did not measure any differently. Minimal slough accumulation with fairly healthy-looking granulation tissue present. 08/24/2022: The buttock ulcer cavity is smaller again today. She no longer has the tenderness at the deepest aspect of the wound. Both ankle wounds measured about [name] millimeter smaller. There is healthy-looking granulation tissue present bilaterally with minimal slough on the left and no slough seen on the right. 08/31/2022: The cavity of the buttock ulcer continues to contract. It is about half [name] centimeter shallower than last week and there is no undermining. The cavity is also narrower and is more difficult to get an index finger into the wound. Both ankle wounds measured slightly smaller. Both have [name] rather dry skin edge but good granulation tissue  present with minimal slough. 09/08/2022: The buttock ulcer cavity is smaller again today. It is shallower and narrower. Both ankle wounds were [name] little bit smaller, as well. There is [name] fair amount of slough on the left lateral ankle wound. 09/14/2022: Unfortunately, the buttock ulcer cavity measured [name] little larger and [name] little deeper today. The patient did say she has been sitting [name] bit more of late. The right medial ankle wound is about the same size; the left lateral ankle  wound is [name] little smaller and shallower. There is an area on the medial aspect of her right calcaneus that looks concerning for potential pressure-induced deep tissue injury. 09/29/2022: Both ankle wounds measured smaller today. Underneath the dried-on Oasis and slough, there is actually fairly healthy-looking granulation tissue filling in. The gluteal ulcer is narrower and tighter, but the depth has not really decreased at all. Andrea [name], Andrea [name] ([id number]) [id number] Page 8 of 12 10/05/2022: Once again, both ankle wounds measured slightly smaller. The tissue surfaces look healthy. The gluteal ulcer continues to contract circumferentially, but the depth remains stable. Patient History Information obtained from Patient, Caregiver, Chart. Family History Cancer - Father,Maternal Grandparents,Siblings,Child, Heart Disease - Father, Hypertension - Father, Thyroid Problems - Mother, No family history of Diabetes, Hereditary Spherocytosis, Kidney Disease, Lung Disease, Seizures, Stroke, Tuberculosis. Social History Former smoker - quit 40 yr ago, Marital Status - Widowed, Alcohol Use - Never, Drug Use - No History, Caffeine Use - Daily - tea, soda. Medical History Eyes Patient has history of Cataracts - bil extracted Denies history of Glaucoma, Optic Neuritis Cardiovascular Patient has history of Arrhythmia - afib, Hypertension Endocrine Denies history of Type I Diabetes, Type II  Diabetes Genitourinary Denies history of End Stage Renal Disease Integumentary (Skin) Denies history of History of Burn Musculoskeletal Patient has history of Osteoarthritis, Osteomyelitis - pelvic Neurologic Patient has history of Neuropathy Oncologic Denies history of Received Chemotherapy, Received Radiation Psychiatric Patient has history of Confinement Anxiety Denies history of Anorexia/bulimia Hospitalization/Surgery History - TEE. - right ankle fusion. - bil knee replacements. - bil cataract extractions. - posterior lumbar fusion. - vaginal hysterectomy. - cholecystectomy. 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 no acute distress. Vitals Time Taken: 1:28 AM, Height: 59 in, Weight: 155 lbs, BMI: 31.3, Temperature: 97.7 F, Pulse: 75 bpm, Respiratory Rate: 18 breaths/min, Blood Pressure: 167/77 mmHg. Respiratory Normal work of breathing on room air. General Notes: 10/05/2022: Once again, both ankle wounds measured slightly smaller. The tissue surfaces look healthy. The gluteal ulcer continues to contract circumferentially, but the depth remains stable. Integumentary (Hair, Skin) Wound #1 status is Open. Original cause of wound was Pressure Injury. The date acquired was: 11/16/2021. The wound has been in treatment 45 weeks. The wound is located on the Right,Medial Malleolus. The wound measures 0.9cm length x 0.7cm width x 0.1cm depth; 0.495cm^2 area and 0.049cm^3 volume. There is Fat Layer (Subcutaneous Tissue) exposed. There is no tunneling or undermining noted. There is [name] small amount of serosanguineous drainage noted. The wound margin is distinct with the outline attached  to the wound base. There is large (67-100%) red granulation within the wound bed. There is [name] small (1-33%) amount of necrotic tissue within the wound bed including Adherent Slough. The periwound skin appearance had no abnormalities noted for texture. The periwound skin appearance had no abnormalities noted for moisture. The periwound skin appearance exhibited: Erythema. The surrounding wound skin color is noted with erythema. Erythema is measured at 1.5 cm. Periwound temperature was noted as No Abnormality. Wound #2 status is Open. Original cause of wound was Bump. The date acquired was: 09/28/2021. The wound has been in treatment 45 weeks. The wound is located on the Right Gluteus. The wound measures 1.3cm length x 1cm width x 4.3cm depth; 1.021cm^2 area and 4.39cm^3 volume. There is Fat Layer (  Subcutaneous Tissue) exposed. There is no tunneling or undermining noted. There is [name] medium amount of serous drainage noted. The wound margin is epibole. Andrea [name], Andrea [name] ([id number]) [id number] Page 9 of 12 There is large (67-100%) red granulation within the wound bed. There is [name] small (1-33%) amount of necrotic tissue within the wound bed including Adherent Slough. The periwound skin appearance had no abnormalities noted for texture. The periwound skin appearance had no abnormalities noted for moisture. The periwound skin appearance had no abnormalities noted for color. Periwound temperature was noted as No Abnormality. The periwound has tenderness on palpation. Wound #4 status is Open. Original cause of wound was Pressure Injury. The date acquired was: 12/23/2021. The wound has been in treatment 39 weeks. The wound is located on the Left,Lateral Ankle. The wound measures 0.8cm length x 0.8cm width x 0.2cm depth; 0.503cm^2 area and 0.101cm^3 volume. There is Fat Layer (Subcutaneous Tissue) exposed. There is no tunneling or undermining noted. There is [name] small amount of serous drainage  noted. The wound margin is distinct with the outline attached to the wound base. There is large (67-100%) red granulation within the wound bed. There is [name] small (1-33%) amount of necrotic tissue within the wound bed including Adherent Slough. The periwound skin appearance had no abnormalities noted for texture. The periwound skin appearance had no abnormalities noted for moisture. The periwound skin appearance had no abnormalities noted for color. Periwound temperature was noted as No Abnormality. The periwound has tenderness on palpation. 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 There was [name] Selective/Open Wound Non-Viable Tissue Debridement with [name] total area of 0.49 sq cm performed by Duanne Guess, MD. With the following instrument(s): Curette to remove Non-Viable tissue/material. Material removed includes Eschar and Slough and after achieving pain control using Lidocaine 4% T opical Solution. No specimens were taken. [name] time out was conducted at 14:00, prior to the start of the procedure. [name] Minimum amount of bleeding was controlled with Pressure. The procedure was tolerated well with [name] pain level of 0 throughout and [name] pain level of 0 following the procedure. Post Debridement Measurements: 0.9cm length x 0.7cm width x 0.1cm depth; 0.049cm^3 volume. Post debridement Stage noted as Category/Stage III. Character of Wound/Ulcer Post Debridement is improved. Post procedure Diagnosis Wound #1: Same as Pre-Procedure Wound #4 Pre-procedure diagnosis of Wound #4 is [name] Pressure  Ulcer located on the Left,Lateral Ankle . There was [name] Selective/Open Wound Non-Viable Tissue Debridement with [name] total area of 0.5 sq cm performed by Duanne Guess, MD. With the following instrument(s): Curette to remove Non-Viable tissue/material. Material removed includes [facility] after achieving pain control using Lidocaine 4% Topical Solution. No specimens were taken. [name] time out was conducted at 14:00, prior to the start of the procedure. [name] Minimum amount of bleeding was controlled with Pressure. The procedure was tolerated well with [name] pain level of 0 throughout and [name] pain level of 0 following the procedure. Post Debridement Measurements: 0.8cm length x 0.8cm width x 0.2cm depth; 0.101cm^3 volume. Post debridement Stage noted as Category/Stage IV. Character of Wound/Ulcer Post Debridement is improved. Post procedure Diagnosis Wound #4: Same as Pre-Procedure Plan Follow-up Appointments: Return  Appointment in 1 week. - Dr. Lady Gary - Room 1 Tuesday 9/17 @ 1:15 pm Anesthetic: (In clinic) Topical Lidocaine 4% applied to wound bed Bathing/ Shower/ Hygiene: May shower and wash wound with soap and water. - with dressing changes Off-Loading: Multipodus Splint to: - prevalon boot to both feet Turn and reposition every 2 hours Home Health: New wound care orders this week; continue Home Health for wound care. May utilize formulary equivalent dressing for wound treatment orders unless otherwise specified. Dressing changes to be completed by Home Health on Monday / Wednesday / Friday except when patient has scheduled visit at [facility]. Other Home Health Orders/Instructions: - [facility] WOUND #1: - Malleolus Wound Laterality: Right, Medial Prim Dressing: Endoform 2x2 in 3 x Per Week/30 Days ary Discharge Instructions: Moisten with saline Secondary Dressing: Woven Gauze Sponge, Non-Sterile 4x4 in 3 x Per Week/30 Days Discharge Instructions: Apply over primary dressing as directed. Secured  With: [employer], 4.5x3.1 (in/yd) 3 x Per Week/30 Days Discharge Instructions: Secure with Kerlix as directed. WOUND #2: - Gluteus Wound Laterality: Right Prim Dressing: Dakin's Solution 0.25%, 16 (oz) 1 x Per Day/30 Days ary Discharge Instructions: Moisten gauze with Dakin's solution Andrea [name], Andrea [name] ([id number]) [id number] Page 10 of 12 Prim Dressing: Promogran Prisma Matrix, 4.34 (sq in) (silver collagen) 1 x Per Day/30 Days ary Discharge Instructions: Moisten collagen with saline or hydrogel Secondary Dressing: Zetuvit Plus Silicone Border Heel Dressing10x10 (in/in) 1 x Per Day/30 Days Discharge Instructions: Apply silicone border over primary dressing as directed. WOUND #4: - Ankle Wound Laterality: Left, Lateral Prim Dressing: Endoform 2x2 in 3 x Per Week/30 Days ary Discharge Instructions: Moisten with saline Secondary Dressing: Woven Gauze Sponge, Non-Sterile 4x4 in 3 x Per Week/30 Days Discharge Instructions: Apply over primary dressing as directed. Secured With: [employer], 4.5x3.1 (in/yd) 3 x Per Week/30 Days Discharge Instructions: Secure with Kerlix as directed. 10/05/2022: Once again, both ankle wounds measured slightly smaller. The tissue surfaces look healthy. The gluteal ulcer continues to contract circumferentially, but the depth remains stable. The gluteal ulcer did not require debridement. We will continue to put Prisma silver collagen at the base and pack the cavity with Dakin's-moistened gauze. I debrided slough and eschar off of both ankle wounds. She has used her allotment of 10 Oasis applications so I am going to change the contact layer to endoform. She will follow-up in 1 week. Electronic Signature(s) Signed: 10/05/2022 2:19:49 PM By: Duanne Guess MD FACS Entered By: Duanne Guess on 10/05/2022 11:19:49 -------------------------------------------------------------------------------- HxROS Details Patient Name:  Date of Service: Andrea [name] 10/05/2022 1:15 PM Medical Record Number: [id number] Patient Account Number: [id number] Date of Birth/Sex: Treating RN: 02-11-1940 (81 y.o. [name] Primary Care Provider: Jarome Matin Other Clinician: Referring Provider: Treating Provider/Extender: Marena Chancy in Treatment: 45 Information Obtained From Patient Caregiver Chart Constitutional Symptoms (General Health) Medical History: Past Medical History Notes: morbid obesity Eyes Medical History: Positive for: Cataracts - bil extracted Negative for: Glaucoma; Optic Neuritis Ear/Nose/Mouth/Throat Medical History: Past Medical History Notes: hard of hearing Cardiovascular Medical History: Positive for: Arrhythmia - afib; Hypertension Gastrointestinal Medical History: Past Medical History Notes: GERD Endocrine Medical HistoryALAYAH, Andrea [name] ([id number]) [id number] Page 11 of 12 Negative for: Type I Diabetes; Type II Diabetes Past Medical History Notes: adrenal insufficiency Genitourinary Medical History: Negative for: End Stage Renal Disease Integumentary (Skin) Medical History: Negative for: History of Burn Musculoskeletal Medical History: Positive for: Osteoarthritis; Osteomyelitis - pelvic Past Medical  History Notes: psoriatic arthritis, thoracic discitis, displaced spiral fx right tibia Neurologic Medical History: Positive for: Neuropathy Past Medical History Notes: hx [facility] Oncologic Medical History: Negative for: Received Chemotherapy; Received Radiation Psychiatric Medical History: Positive for: Confinement Anxiety Negative for: Anorexia/bulimia HBO Extended History Items Eyes: Cataracts Immunizations Pneumococcal Vaccine: Received Pneumococcal Vaccination: Yes Received Pneumococcal Vaccination On or After 60th Birthday: Yes Implantable Devices None Hospitalization / Surgery History Type of  Hospitalization/Surgery TEE right ankle fusion bil knee replacements bil cataract extractions posterior lumbar fusion vaginal hysterectomy cholecystectomy Family and Social History Cancer: Yes - Father,Maternal Grandparents,Siblings,Child; Diabetes: No; Heart Disease: Yes - Father; Hereditary Spherocytosis: No; Hypertension: Yes - Father; Kidney Disease: No; Lung Disease: No; Seizures: No; Stroke: No; Thyroid Problems: Yes - Mother; Tuberculosis: No; Former smoker - quit 40 yr ago; Marital Status - Widowed; Alcohol Use: Never; Drug Use: No History; Caffeine Use: Daily - tea, soda; Financial Concerns: No; Food, Clothing or Shelter Needs: No; Support System Lacking: No; Transportation Concerns: No Psychologist, prison and probation services) Signed: 10/05/2022 3:58:21 PM By: Duanne Guess MD FACS Signed: 10/05/2022 4:43:06 PM By: Zenaida Deed RN, BSN Entered By: Duanne Guess on 10/05/2022 11:15:31 [name] ([id number]) [id number] Page 12 of 12 -------------------------------------------------------------------------------- SuperBill Details Patient Name: Date of Service: Andrea [name] 10/05/2022 Medical Record Number: [id number] Patient Account Number: [id number] Date of Birth/Sex: Treating RN: 18-Feb-1940 (82 y.o. 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 L02.31 Cutaneous abscess of buttock E27.40 Unspecified adrenocortical insufficiency Z79.52 Long term (current) use of systemic steroids I10 Essential (primary) hypertension Facility Procedures : CPT4 Code: [id number] Description:  7597 - DEBRIDE WOUND 1ST 20 SQ CM OR < ICD-10 Diagnosis Description 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, stage 3 L89.523 Pressure ulcer of left ankle, stage 3 Modifier: Quantity: 1 Physician Procedures : CPT4 Code Description Modifier [id number] - WC PHYS LEVEL 4 - EST PT 25 ICD-10 Diagnosis Description 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, stage 3 L89.523 Pressure ulcer of left ankle, stage 3 L98.415 Non-pressure chronic ulcer of buttock with  muscle involvement without evidence of necrosis Z79.52 Long term (current) use of systemic steroids Quantity: 1 : [id number] - WC PHYS DEBR WO ANESTH 20 SQ CM ICD-10 Diagnosis Description 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, stage 3 L89.523 Pressure ulcer of left ankle, stage 3 Quantity: 1 Electronic Signature(s) Signed: 10/05/2022 2:20:13 PM By: Duanne Guess MD FACS Entered By: Duanne Guess on 10/05/2022 11:20:13

## 2022-10-05 NOTE — Progress Notes (Signed)
ZENAE, WILKENS [name] ([id number]) [id number] Page 1 of 10 Visit Report for 10/05/2022 Arrival Information Details Patient Name: Date of Service: Andrea [name] 10/05/2022 1:15 PM Medical Record Number: [id number] Patient Account Number: [id number] Date of Birth/Sex: Treating RN: 07-07-1940 (82 y.o. F) Primary Care Lalia Loudon: Jarome Matin Other Clinician: Referring Tammatha Cobb: Treating Sherly Brodbeck/Extender: Marena Chancy in Treatment: 45 Visit Information History Since Last Visit All ordered tests and consults were completed: No Patient Arrived: Wheel Chair Added or deleted any medications: No Arrival Time: 13:28 Any new allergies or adverse reactions: No Accompanied By: granddaughter Had [name] fall or experienced change in No Transfer Assistance: None activities of daily living that may affect Patient Identification Verified: Yes risk of falls: Secondary Verification Process Completed: Yes Signs or symptoms of abuse/neglect since last visito No Patient Requires Transmission-Based Precautions: No Hospitalized since last visit: No Patient Has Alerts: No Implantable device outside of the clinic excluding No cellular tissue based products placed in the center since last visit: Has Dressing in Place as Prescribed: Yes Pain Present Now: No Electronic Signature(s) Signed: 10/05/2022 4:43:06 PM By: Zenaida Deed RN, BSN Entered By: Zenaida Deed on 10/05/2022 10:40:07 -------------------------------------------------------------------------------- Encounter Discharge Information Details Patient Name: Date of Service: Andrea [name] 10/05/2022 1:15 PM Medical Record Number: [id number] Patient Account Number: [id number] Date of Birth/Sex: Treating RN: [birth year] (81 y.o. [name] Primary Care Takirah Binford: Jarome Matin Other Clinician: Referring Colden Samaras: Treating Orhan Mayorga/Extender: Marena Chancy in  Treatment: 45 Encounter Discharge Information Items Post Procedure Vitals Discharge Condition: Stable Temperature (F): 97.7 Ambulatory Status: Wheelchair Pulse (bpm): 75 Discharge Destination: Home Respiratory Rate (breaths/min): 18 Transportation: Private Auto Blood Pressure (mmHg): 167/77 Accompanied By: caregiver Schedule Follow-up Appointment: Yes Clinical Summary of Care: Patient Declined Electronic Signature(s) Signed: 10/05/2022 4:43:06 PM By: Zenaida Deed RN, BSN Entered By: Zenaida Deed on 10/05/2022 11:28:28 Andrea [name] ([id number]) [id number] Page 2 of 10 -------------------------------------------------------------------------------- Lower Extremity Assessment Details Patient Name: Date of Service: Andrea [name] 10/05/2022 1:15 PM Medical Record Number: [id number] Patient Account Number: [id number] Date of Birth/Sex: Treating RN: 1940-04-03 (82 y.o. [name] Primary Care Tahir Blank: Jarome Matin Other Clinician: Referring Paris Hohn: Treating Taran Hable/Extender: Marena Chancy in Treatment: 45 Edema Assessment Assessed: [Left: No] [Right: No] Edema: [Left: No] [Right: No] Calf Left: Right: Point of Measurement: From Medial Instep 28 cm 32 cm Ankle Left: Right: Point of Measurement: From Medial Instep 18.2 cm 22 cm Vascular Assessment Pulses: Dorsalis Pedis Palpable: [Left:Yes] [Right:Yes] Extremity colors, hair growth, and conditions: Extremity Color: [Left:Hyperpigmented] [Right:Hyperpigmented] Hair Growth on Extremity: [Left:No] [Right:No] Temperature of Extremity: [Left:Warm < 3 seconds] [Right:Warm < 3 seconds] Electronic Signature(s) Signed: 10/05/2022 4:43:06 PM By: Zenaida Deed RN, BSN Entered By: Zenaida Deed on 10/05/2022 10:40:52 -------------------------------------------------------------------------------- Multi Wound Chart Details Patient Name: Date of Service: Andrea [name] 10/05/2022 1:15 PM Medical Record Number: [id number] Patient Account Number: [id number] Date of Birth/Sex: Treating RN: March 16, 1940 (81 y.o. [name] Primary Care Deitrick Ferreri: Jarome Matin Other Clinician: Referring Gisele Pack: Treating Mahmoud Blazejewski/Extender: Marena Chancy in Treatment: 45 Vital Signs Height(in): 59 Pulse(bpm): 75 Weight(lbs): 155 Blood Pressure(mmHg): 167/77 Body Mass Index(BMI): 31.3 Temperature(F): 97.7 Respiratory Rate(breaths/min): 18 [1:Photos:] [[id number] Page 3 of 10] Right, Medial Malleolus Right Gluteus Left, Lateral Ankle Wound Location: Pressure Injury Bump Pressure Injury Wounding Event: Pressure Ulcer Abscess Pressure Ulcer Primary Etiology: Cataracts, Arrhythmia, Hypertension,Cataracts, Arrhythmia, Hypertension, Cataracts, Arrhythmia, Hypertension, Comorbid History: Osteoarthritis, Osteomyelitis, Osteoarthritis,  Osteomyelitis, Osteoarthritis, Osteomyelitis, Neuropathy, Confinement Anxiety Neuropathy, Confinement Anxiety Neuropathy, Confinement Anxiety 11/16/2021 09/28/2021 12/23/2021 Date Acquired: 45 45 39 Weeks of Treatment: Open Open Open Wound Status: No No No Wound Recurrence: 0.9x0.7x0.1 1.3x1x4.3 0.8x0.8x0.2 Measurements L x W x D (cm) 0.495 1.021 0.503 [name] (cm) : rea 0.049 4.39 0.101 Volume (cm) : 67.80% 90.20% -256.70% % Reduction in [name] rea: 68.20% 86.00% -621.40% % Reduction in Volume: Category/Stage III Full Thickness With Exposed Support Category/Stage IV Classification: Structures Small Medium Small Exudate [name] mount: Serosanguineous Serous Serous Exudate Type: red, brown amber amber Exudate Color: Distinct, outline attached Epibole Distinct, outline attached Wound Margin: Large (67-100%) Large (67-100%) Large (67-100%) Granulation [name] mount: Red Red Red Granulation Quality: Small (1-33%) Small (1-33%) Small (1-33%) Necrotic [name] mount: Fat Layer  (Subcutaneous Tissue): Yes Fat Layer (Subcutaneous Tissue): Yes Fat Layer (Subcutaneous Tissue): Yes Exposed Structures: Fascia: No Fascia: No Fascia: No Tendon: No Tendon: No Tendon: No Muscle: No Muscle: No Muscle: No Joint: No Joint: No Joint: No Bone: No Bone: No Bone: No Small (1-33%) Small (1-33%) None Epithelialization: Debridement - Selective/Open Wound N/[name] Debridement - Selective/Open Wound Debridement: Pre-procedure Verification/Time Out 14:00 N/[name] 14:00 Taken: Lidocaine 4% Topical Solution N/[name] Lidocaine 4% Topical Solution Pain Control: Necrotic/Eschar, Slough N/[name] Slough Tissue Debrided: Non-Viable Tissue N/[name] Non-Viable Tissue Level: 0.49 N/[name] 0.5 Debridement [name] (sq cm): rea Curette N/[name] Curette Instrument: Minimum N/[name] Minimum Bleeding: Pressure N/[name] Pressure Hemostasis [name] chieved: 0 N/[name] 0 Procedural Pain: 0 N/[name] 0 Post Procedural Pain: Procedure was tolerated well N/[name] Procedure was tolerated well Debridement Treatment Response: 0.9x0.7x0.1 N/[name] 0.8x0.8x0.2 Post Debridement Measurements L x W x D (cm) 0.049 N/[name] 0.101 Post Debridement Volume: (cm) Category/Stage III N/[name] Category/Stage IV Post Debridement Stage: No Abnormalities Noted No Abnormalities Noted No Abnormalities Noted Periwound Skin Texture: No Abnormalities Noted No Abnormalities Noted No Abnormalities Noted Periwound Skin Moisture: Erythema: Yes Erythema: No Erythema: No Periwound Skin Color: Rubor: No Measured: 1.5cm N/[name] N/[name] Erythema Measurement: No Abnormality No Abnormality No Abnormality Temperature: N/[name] Yes Yes Tenderness on Palpation: Debridement N/[name] Debridement Procedures Performed: Treatment Notes Electronic Signature(s) Signed: 10/05/2022 2:14:10 PM By: Duanne Guess MD FACS Signed: 10/05/2022 4:43:06 PM By: Zenaida Deed RN, BSN Entered By: Duanne Guess on 10/05/2022  11:14:10 -------------------------------------------------------------------------------- Multi-Disciplinary Care Plan Details Patient Name: Date of Service: Andrea Lain [name]. 10/05/2022 1:15 PM Medical Record Number: [id number] Patient Account Number: [id number] WAKITA, LABERGE [name] ([id number]) [id number] Page 4 of 10 Date of Birth/Sex: Treating RN: 1940/09/01 (82 y.o. [name] Primary Care Lacretia Tindall: Other Clinician: Jarome Matin Referring Samhita Kretsch: Treating Prayan Ulin/Extender: Marena Chancy in Treatment: 45 Multidisciplinary Care Plan reviewed with physician Active Inactive Pressure Nursing Diagnoses: Knowledge deficit related to causes and risk factors for pressure ulcer development Knowledge deficit related to management of pressures ulcers Potential for impaired tissue integrity related to pressure, friction, moisture, and shear Goals: Patient/caregiver will verbalize understanding of pressure ulcer management Date Initiated: 11/24/2021 Target Resolution Date: 11/24/2022 Goal Status: Active Interventions: Assess: immobility, friction, shearing, incontinence upon admission and as needed Assess offloading mechanisms upon admission and as needed Assess potential for pressure ulcer upon admission and as needed Provide education on pressure ulcers Treatment Activities: Pressure reduction/relief device ordered : 11/24/2021 Notes: Wound/Skin Impairment Nursing Diagnoses: Impaired tissue integrity Knowledge deficit related to ulceration/compromised skin integrity Goals: Patient/caregiver will verbalize understanding of skin care regimen Date Initiated: 11/24/2021 Target Resolution Date: 11/24/2022 Goal Status: Active Ulcer/skin breakdown will have [name] volume reduction of 30% by week  4 Date Initiated: 11/24/2021 Date Inactivated: 05/31/2022 Target Resolution Date: 05/25/2022 Unmet Reason: refuses VAC, Goal Status:  Unmet noncompliant with offloading Interventions: Assess patient/caregiver ability to obtain necessary supplies Assess patient/caregiver ability to perform ulcer/skin care regimen upon admission and as needed Assess ulceration(s) every visit Provide education on ulcer and skin care Treatment Activities: Skin care regimen initiated : 11/24/2021 Topical wound management initiated : 11/24/2021 Notes: Electronic Signature(s) Signed: 10/05/2022 4:43:06 PM By: Zenaida Deed RN, BSN Entered By: Zenaida Deed on 10/05/2022 10:56:59 -------------------------------------------------------------------------------- Pain Assessment Details Patient Name: Date of Service: Andrea Lain [name]. 10/05/2022 1:15 PM Andrea [name] ([id number]) [id number] Page 5 of 10 Medical Record Number: [id number] Patient Account Number: [id number] Date of Birth/Sex: Treating RN: 08-May-1940 (82 y.o. F) Primary Care Glennie Rodda: Jarome Matin Other Clinician: Referring Emrys Mckamie: Treating Effa Yarrow/Extender: Marena Chancy in Treatment: 45 Active Problems Location of Pain Severity and Description of Pain Patient Has Paino No Site Locations Rate the pain. Current Pain Level: 0 Pain Management and Medication Current Pain Management: Electronic Signature(s) Signed: 10/05/2022 4:43:06 PM By: Zenaida Deed RN, BSN Entered By: Zenaida Deed on 10/05/2022 10:40:23 -------------------------------------------------------------------------------- Patient/Caregiver Education Details Patient Name: Date of Service: Andrea [name] 9/10/2024andnbsp1:15 PM Medical Record Number: [id number] Patient Account Number: [id number] Date of Birth/Gender: Treating RN: 1940/12/15 (81 y.o. [name] Primary Care Physician: Jarome Matin Other Clinician: Referring Physician: Treating Physician/Extender: Marena Chancy in Treatment:  45 Education Assessment Education Provided To: Patient Education Topics Provided Pressure: Methods: Explain/Verbal Responses: Reinforcements needed, State content correctly Wound/Skin Impairment: Methods: Explain/Verbal Responses: Reinforcements needed, State content correctly Electronic Signature(s) Signed: 10/05/2022 4:43:06 PM By: Zenaida Deed RN, BSN Entered By: Zenaida Deed on 10/05/2022 10:57:40 Andrea [name] ([id number]) [id number] Page 6 of 10 -------------------------------------------------------------------------------- Wound Assessment Details Patient Name: Date of Service: Andrea [name] 10/05/2022 1:15 PM Medical Record Number: [id number] Patient Account Number: [id number] Date of Birth/Sex: Treating RN: 03-24-1940 (82 y.o. Andrea [name], Andrea [name] Primary Care Marianna Cid: Jarome Matin Other Clinician: Referring Dalyah Pla: Treating Makira Holleman/Extender: Marena Chancy in Treatment: 45 Wound Status Wound Number: 1 Primary Pressure Ulcer Etiology: Wound Location: Right, Medial Malleolus Wound Open Wounding Event: Pressure Injury Status: Date Acquired: 11/16/2021 Comorbid Cataracts, Arrhythmia, Hypertension, Osteoarthritis, Weeks Of Treatment: 45 History: Osteomyelitis, Neuropathy, Confinement Anxiety Clustered Wound: No Photos Wound Measurements Length: (cm) 0 Width: (cm) 0 Depth: (cm) 0 Area: (cm) Volume: (cm) .9 % Reduction in Area: 67.8% .7 % Reduction in Volume: 68.2% .1 Epithelialization: Small (1-33%) 0.495 Tunneling: No 0.049 Undermining: No Wound Description Classification: Category/Stage III Wound Margin: Distinct, outline attached Exudate Amount: Small Exudate Type: Serosanguineous Exudate Color: red, brown Foul Odor After Cleansing: No Slough/Fibrino Yes Wound Bed Granulation Amount: Large (67-100%) Exposed Structure Granulation Quality: Red Fascia Exposed: No Necrotic Amount: Small  (1-33%) Fat Layer (Subcutaneous Tissue) Exposed: Yes Necrotic Quality: Adherent Slough Tendon Exposed: No Muscle Exposed: No Joint Exposed: No Bone Exposed: No Periwound Skin Texture Texture Color No Abnormalities Noted: Yes No Abnormalities Noted: No Erythema: Yes Moisture Erythema Measurement: Measured No Abnormalities Noted: Yes 1.5 cm Temperature / Pain Temperature: No Abnormality Treatment Notes Andrea [name], Andrea [name] ([id number]) [id number] Page 7 of 10 Wound #1 (Malleolus) Wound Laterality: Right, Medial Cleanser Andrea-Wound Care Topical Primary Dressing Endoform 2x2 in Discharge Instruction: Moisten with saline Secondary Dressing Woven Gauze Sponge, Non-Sterile 4x4 in Discharge Instruction: Apply over primary dressing as directed. Secured With [employer], 4.5x3.1 (in/yd) Discharge Instruction: Secure with [employer]  as directed. Compression Wrap Compression Stockings Add-Ons Electronic Signature(s) Signed: 10/05/2022 4:43:06 PM By: Zenaida Deed RN, BSN Entered By: Zenaida Deed on 10/05/2022 10:47:02 -------------------------------------------------------------------------------- Wound Assessment Details Patient Name: Date of Service: Andrea [name] 10/05/2022 1:15 PM Medical Record Number: [id number] Patient Account Number: [id number] Date of Birth/Sex: Treating RN: 1940/08/19 (82 y.o. Andrea [name], Andrea [name] Primary Care Monzerrat Wellen: Jarome Matin Other Clinician: Referring Hasina Kreager: Treating Gwenetta Devos/Extender: Marena Chancy in Treatment: 45 Wound Status Wound Number: 2 Primary Abscess Etiology: Wound Location: Right Gluteus Wound Open Wounding Event: Bump Status: Date Acquired: 09/28/2021 Comorbid Cataracts, Arrhythmia, Hypertension, Osteoarthritis, Weeks Of Treatment: 45 History: Osteomyelitis, Neuropathy, Confinement Anxiety Clustered Wound: No Photos Wound Measurements Length: (cm) 1.3 Width:  (cm) 1 Depth: (cm) 4.3 Area: (cm) 1.021 Volume: (cm) 4.39 Andrea [name], Andrea [name] ([id number]) Wound Description Classification: Full Thickness With Exposed Suppo Wound Margin: Epibole Exudate Amount: Medium Exudate Type: Serous Exudate Color: amber rt Structures Foul Odor After Cleansing: Slough/Fibrino % Reduction in Area: 90.2% % Reduction in Volume: 86% Epithelialization: Small (1-33%) Tunneling: No Undermining: No [id number] Page 8 of 10 No Yes Wound Bed Granulation Amount: Large (67-100%) Exposed Structure Granulation Quality: Red Fascia Exposed: No Necrotic Amount: Small (1-33%) Fat Layer (Subcutaneous Tissue) Exposed: Yes Necrotic Quality: Adherent Slough Tendon Exposed: No Muscle Exposed: No Joint Exposed: No Bone Exposed: No Periwound Skin Texture Texture Color No Abnormalities Noted: Yes No Abnormalities Noted: Yes Moisture Temperature / Pain No Abnormalities Noted: Yes Temperature: No Abnormality Tenderness on Palpation: Yes Treatment Notes Wound #2 (Gluteus) Wound Laterality: Right Cleanser Andrea-Wound Care Topical Primary Dressing Dakin's Solution 0.25%, 16 (oz) Discharge Instruction: Moisten gauze with Dakin's solution Promogran Prisma Matrix, 4.34 (sq in) (silver collagen) Discharge Instruction: Moisten collagen with saline or hydrogel Secondary Dressing Zetuvit Plus Silicone Border Heel Dressing10x10 (in/in) Discharge Instruction: Apply silicone border over primary dressing as directed. Secured With Compression Wrap Compression Stockings Facilities manager) Signed: 10/05/2022 4:43:06 PM By: Zenaida Deed RN, BSN Entered By: Zenaida Deed on 10/05/2022 10:54:14 -------------------------------------------------------------------------------- Wound Assessment Details Patient Name: Date of Service: Andrea [name] 10/05/2022 1:15 PM Medical Record Number: [id number] Patient Account Number: [id number] Date of  Birth/Sex: Treating RN: 1940-07-29 (81 y.o. [name] Primary Care Braylyn Kalter: Jarome Matin Other Clinician: Referring Estephania Licciardi: Treating Evaan Tidwell/Extender: Marena Chancy in Treatment: 45 Wound Status Wound Number: 4 Primary Pressure Ulcer Etiology: Wound Location: Left, Lateral Ankle Andrea [name], Andrea [name] ([id number]) [id number] Page 9 of 10 Wound Open Wounding Event: Pressure Injury Status: Date Acquired: 12/23/2021 Comorbid Cataracts, Arrhythmia, Hypertension, Osteoarthritis, Weeks Of Treatment: 39 History: Osteomyelitis, Neuropathy, Confinement Anxiety Clustered Wound: No Photos Wound Measurements Length: (cm) 0.8 Width: (cm) 0.8 Depth: (cm) 0.2 Area: (cm) 0.503 Volume: (cm) 0.101 % Reduction in Area: -256.7% % Reduction in Volume: -621.4% Epithelialization: None Tunneling: No Undermining: No Wound Description Classification: Category/Stage IV Wound Margin: Distinct, outline attached Exudate Amount: Small Exudate Type: Serous Exudate Color: amber Foul Odor After Cleansing: No Slough/Fibrino Yes Wound Bed Granulation Amount: Large (67-100%) Exposed Structure Granulation Quality: Red Fascia Exposed: No Necrotic Amount: Small (1-33%) Fat Layer (Subcutaneous Tissue) Exposed: Yes Necrotic Quality: Adherent Slough Tendon Exposed: No Muscle Exposed: No Joint Exposed: No Bone Exposed: No Periwound Skin Texture Texture Color No Abnormalities Noted: Yes No Abnormalities Noted: Yes Moisture Temperature / Pain No Abnormalities Noted: Yes Temperature: No Abnormality Tenderness on Palpation: Yes Treatment Notes Wound #4 (Ankle) Wound Laterality: Left, Lateral Cleanser Andrea-Wound Care Topical Primary Dressing Endoform 2x2 in Discharge Instruction:  Moisten with saline Secondary Dressing Woven Gauze Sponge, Non-Sterile 4x4 in Discharge Instruction: Apply over primary dressing as directed. Secured  With [employer], 4.5x3.1 (in/yd) Discharge Instruction: Secure with [employer] as directed. Compression Wrap Compression Stockings Andrea [name], Andrea [name] ([id number]) [id number] Page 10 of 10 Add-Ons Electronic Signature(s) Signed: 10/05/2022 4:43:06 PM By: Zenaida Deed RN, BSN Entered By: Zenaida Deed on 10/05/2022 10:47:39 -------------------------------------------------------------------------------- Vitals Details Patient Name: Date of Service: Andrea [name] 10/05/2022 1:15 PM Medical Record Number: [id number] Patient Account Number: [id number] Date of Birth/Sex: Treating RN: 1940/11/15 (81 y.o. F) Primary Care Darrin Koman: Jarome Matin Other Clinician: Referring Kirsti Mcalpine: Treating Gwendlyn Hanback/Extender: Marena Chancy in Treatment: 45 Vital Signs Time Taken: 01:28 Temperature (F): 97.7 Height (in): 59 Pulse (bpm): 75 Weight (lbs): 155 Respiratory Rate (breaths/min): 18 Body Mass Index (BMI): 31.3 Blood Pressure (mmHg): 167/77 Reference Range: 80 - 120 mg / dl Electronic Signature(s) Signed: 10/05/2022 4:43:06 PM By: Zenaida Deed RN, BSN Entered By: Zenaida Deed on 10/05/2022 10:40:11

## 2022-10-12 ENCOUNTER — Encounter (HOSPITAL_BASED_OUTPATIENT_CLINIC_OR_DEPARTMENT_OTHER): Payer: Medicare Other | Admitting: General Surgery

## 2022-10-12 DIAGNOSIS — L89523 Pressure ulcer of left ankle, stage 3: Secondary | ICD-10-CM | POA: Diagnosis not present

## 2022-10-12 NOTE — Progress Notes (Signed)
Pain Temperature: No Abnormality Treatment Notes Wound #1 (Malleolus) Wound Laterality: Right, Medial Cleanser Peri-Wound Care Topical Primary Dressing Promogran Prisma Matrix, 4.34 (sq in) (silver collagen) Discharge Instruction: Moisten collagen with saline or hydrogel Secondary Dressing Woven Gauze Sponge, Non-Sterile 4x4 in Discharge Instruction: Apply over primary dressing as directed. Secured With [employer], 4.5x3.1 (in/yd) Discharge Instruction: Secure with Kerlix as directed. Compression Wrap Compression Stockings Add-Ons Electronic Signature(s) Signed: 10/12/2022 4:41:09 PM By: Zenaida Deed RN, BSN Entered By: Zenaida Deed on 10/12/2022 10:58:09 -------------------------------------------------------------------------------- Wound Assessment Details Patient Name: Date of Service: Andrea Yu 10/12/2022 1:15 PM Medical Record Number: [id number] Patient Account Number: [id number] Date of Birth/Sex: Treating RN: 02-04-1940 (82 y.o. [name] Primary Care Merida Alcantar: Jarome Matin Other Clinician: Referring Kenzy Campoverde: Treating Fitzpatrick Alberico/Extender: Marena Chancy  in Treatment: 46 Wound Status Wound Number: 2 Primary Abscess Etiology: Wound Location: Right Gluteus Wound Open Wounding Event: Bump Status: Date Acquired: 09/28/2021 Comorbid Cataracts, Arrhythmia, Hypertension, Osteoarthritis, Weeks Of Treatment: 46 History: Osteomyelitis, Neuropathy, Confinement Anxiety Clustered Wound: No Photos DARIYAN, THAMMAVONG A ([id number]) [id number] Page 8 of 10 Wound Measurements Length: (cm) 0.8 Width: (cm) 1.2 Depth: (cm) 3.7 Area: (cm) 0.754 Volume: (cm) 2.79 % Reduction in Area: 92.8% % Reduction in Volume: 91.1% Epithelialization: Small (1-33%) Tunneling: No Undermining: No Wound Description Classification: Full Thickness With Exposed Suppo Wound Margin: Epibole Exudate Amount: Medium Exudate Type: Serous Exudate Color: amber rt Structures Foul Odor After Cleansing: No Slough/Fibrino Yes Wound Bed Granulation Amount: Large (67-100%) Exposed Structure Granulation Quality: Red Fascia Exposed: No Necrotic Amount: Small (1-33%) Fat Layer (Subcutaneous Tissue) Exposed: Yes Necrotic Quality: Adherent Slough Tendon Exposed: No Muscle Exposed: No Joint Exposed: No Bone Exposed: No Periwound Skin Texture Texture Color No Abnormalities Noted: Yes No Abnormalities Noted: Yes Moisture Temperature / Pain No Abnormalities Noted: Yes Temperature: No Abnormality Tenderness on Palpation: Yes Treatment Notes Wound #2 (Gluteus) Wound Laterality: Right Cleanser Peri-Wound Care Topical Primary Dressing Dakin's Solution 0.25%, 16 (oz) Discharge Instruction: Moisten gauze with Dakin's solution Promogran Prisma Matrix, 4.34 (sq in) (silver collagen) Discharge Instruction: Moisten collagen with saline or hydrogel Secondary Dressing Zetuvit Plus Silicone Border Heel Dressing10x10 (in/in) Discharge Instruction: Apply silicone border over primary dressing as directed. Secured With Compression Wrap Compression  Stockings Facilities manager) Signed: 10/12/2022 4:41:09 PM By: Zenaida Deed RN, BSN Entered By: Zenaida Deed on 10/12/2022 10:56:08 [name] ([id number]) [id number] Page 9 of 10 -------------------------------------------------------------------------------- Wound Assessment Details Patient Name: Date of Service: Andrea Yu 10/12/2022 1:15 PM Medical Record Number: [id number] Patient Account Number: [id number] Date of Birth/Sex: Treating RN: 03-13-1940 (82 y.o. [name] Primary Care Elleen Coulibaly: Jarome Matin Other Clinician: Referring Christiana Gurevich: Treating Devonne Kitchen/Extender: Marena Chancy in Treatment: 46 Wound Status Wound Number: 4 Primary Pressure Ulcer Etiology: Wound Location: Left, Lateral Ankle Wound Open Wounding Event: Pressure Injury Status: Date Acquired: 12/23/2021 Comorbid Cataracts, Arrhythmia, Hypertension, Osteoarthritis, Weeks Of Treatment: 40 History: Osteomyelitis, Neuropathy, Confinement Anxiety Clustered Wound: No Photos Wound Measurements Length: (cm) 0 Width: (cm) 0 Depth: (cm) 0 Area: (cm) Volume: (cm) .8 % Reduction in Area: -212.1% .7 % Reduction in Volume: -528.6% .2 Epithelialization: None 0.44 Tunneling: No 0.088 Undermining: No Wound Description Classification: Category/Stage IV Wound Margin: Distinct, outline attached Exudate Amount: Medium Exudate Type: Serous Exudate Color: amber Foul Odor After Cleansing: No Slough/Fibrino Yes Wound Bed Granulation Amount: Medium (34-66%) Exposed Structure Granulation Quality: Pink, Pale Fascia Exposed: No Necrotic Amount: Medium (34-66%) Fat Layer (Subcutaneous  Anxiety Neuropathy, Confinement Anxiety 11/16/2021 09/28/2021 12/23/2021 Date Acquired: 46 46 40 Weeks of Treatment: Open Open Open Wound Status: No No No Wound Recurrence: 0.8x0.8x0.1 0.8x1.2x3.7 0.8x0.7x0.2 Measurements L x W x D (cm) 0.503 0.754 0.44 A (cm) : rea 0.05 2.79 0.088 Volume (cm) : 67.30% 92.80% -212.10% % Reduction in A rea: 67.50% 91.10% -528.60% % Reduction in Volume: Category/Stage III Full Thickness With Exposed Support Category/Stage IV Classification: Structures Medium Medium Medium Exudate A mount: Serosanguineous Serous Serous Exudate Type: red, brown amber amber Exudate Color: Distinct, outline attached Epibole Distinct, outline attached Wound Margin: Large (67-100%) Large (67-100%) Medium (34-66%) Granulation A mount: Pink, Pale Red Pink, Pale Granulation Quality: Small (1-33%) Small (1-33%) Medium (34-66%) Necrotic A mount: Fat Layer (Subcutaneous Tissue): Yes Fat  Layer (Subcutaneous Tissue): Yes Fat Layer (Subcutaneous Tissue): Yes Exposed Structures: Fascia: No Fascia: No Fascia: No Tendon: No Tendon: No Tendon: No Muscle: No Muscle: No Muscle: No Joint: No Joint: No Joint: No Bone: No Bone: No Bone: No Small (1-33%) Small (1-33%) None Epithelialization: Debridement - Excisional N/A Debridement - Excisional Debridement: Pre-procedure Verification/Time Out 14:00 N/A 14:00 Taken: Lidocaine 4% Topical Solution N/A Lidocaine 4% Topical Solution Pain Control: Subcutaneous, Slough N/A Subcutaneous, Slough Tissue Debrided: Skin/Subcutaneous Tissue N/A Skin/Subcutaneous Tissue Level: 0.5 N/A 4.4 Debridement A (sq cm): rea Curette N/A Curette Instrument: Minimum N/A Minimum Bleeding: Pressure N/A Pressure Hemostasis A chieved: 0 N/A 0 Procedural Pain: 0 N/A 0 Post Procedural Pain: Procedure was tolerated well N/A Procedure was tolerated well Debridement Treatment Response: 0.8x0.8x0.1 N/A 0.8x7x0.2 Post Debridement Measurements L x W x D (cm) 0.05 N/A 0.88 Post Debridement Volume: (cm) Category/Stage III N/A Category/Stage IV Post Debridement Stage: No Abnormalities Noted No Abnormalities Noted No Abnormalities Noted Periwound Skin Texture: No Abnormalities Noted No Abnormalities Noted No Abnormalities Noted Periwound Skin Moisture: Erythema: Yes Erythema: No Erythema: No Periwound Skin Color: Rubor: No Measured: 1.5cm N/A N/A Erythema Measurement: No Abnormality No Abnormality No Abnormality Temperature: N/A Yes Yes Tenderness on Palpation: Debridement N/A Debridement Procedures Performed: Treatment Notes Electronic Signature(s) Signed: 10/12/2022 2:11:53 PM By: Duanne Guess MD FACS Entered By: Duanne Guess on 10/12/2022 11:11:52 -------------------------------------------------------------------------------- Multi-Disciplinary Care Plan Details Patient Name: Date of Service: [name]  10/12/2022 1:15 PM Medical Record Number: [id number] Patient Account Number: [id number] MERYLE, KAHLEY A ([id number]) [id number] Page 4 of 10 Date of Birth/Sex: Treating RN: Jul 06, 1940 (82 y.o. [name] Primary Care Demorris Choyce: Other Clinician: Jarome Matin Referring Abrian Hanover: Treating Avelina Mcclurkin/Extender: Marena Chancy in Treatment: 46 Multidisciplinary Care Plan reviewed with physician Active Inactive Pressure Nursing Diagnoses: Knowledge deficit related to causes and risk factors for pressure ulcer development Knowledge deficit related to management of pressures ulcers Potential for impaired tissue integrity related to pressure, friction, moisture, and shear Goals: Patient/caregiver will verbalize understanding of pressure ulcer management Date Initiated: 11/24/2021 Target Resolution Date: 11/24/2022 Goal Status: Active Interventions: Assess: immobility, friction, shearing, incontinence upon admission and as needed Assess offloading mechanisms upon admission and as needed Assess potential for pressure ulcer upon admission and as needed Provide education on pressure ulcers Treatment Activities: Pressure reduction/relief device ordered : 11/24/2021 Notes: Wound/Skin Impairment Nursing Diagnoses: Impaired tissue integrity Knowledge deficit related to ulceration/compromised skin integrity Goals: Patient/caregiver will verbalize understanding of skin care regimen Date Initiated: 11/24/2021 Target Resolution Date: 11/24/2022 Goal Status: Active Ulcer/skin breakdown will have a volume reduction of 30% by week 4 Date Initiated: 11/24/2021 Date Inactivated: 05/31/2022 Target Resolution Date: 05/25/2022 Unmet Reason: refuses VAC, Goal  Pain Temperature: No Abnormality Treatment Notes Wound #1 (Malleolus) Wound Laterality: Right, Medial Cleanser Peri-Wound Care Topical Primary Dressing Promogran Prisma Matrix, 4.34 (sq in) (silver collagen) Discharge Instruction: Moisten collagen with saline or hydrogel Secondary Dressing Woven Gauze Sponge, Non-Sterile 4x4 in Discharge Instruction: Apply over primary dressing as directed. Secured With [employer], 4.5x3.1 (in/yd) Discharge Instruction: Secure with Kerlix as directed. Compression Wrap Compression Stockings Add-Ons Electronic Signature(s) Signed: 10/12/2022 4:41:09 PM By: Zenaida Deed RN, BSN Entered By: Zenaida Deed on 10/12/2022 10:58:09 -------------------------------------------------------------------------------- Wound Assessment Details Patient Name: Date of Service: Andrea Yu 10/12/2022 1:15 PM Medical Record Number: [id number] Patient Account Number: [id number] Date of Birth/Sex: Treating RN: 02-04-1940 (82 y.o. [name] Primary Care Merida Alcantar: Jarome Matin Other Clinician: Referring Kenzy Campoverde: Treating Fitzpatrick Alberico/Extender: Marena Chancy  in Treatment: 46 Wound Status Wound Number: 2 Primary Abscess Etiology: Wound Location: Right Gluteus Wound Open Wounding Event: Bump Status: Date Acquired: 09/28/2021 Comorbid Cataracts, Arrhythmia, Hypertension, Osteoarthritis, Weeks Of Treatment: 46 History: Osteomyelitis, Neuropathy, Confinement Anxiety Clustered Wound: No Photos DARIYAN, THAMMAVONG A ([id number]) [id number] Page 8 of 10 Wound Measurements Length: (cm) 0.8 Width: (cm) 1.2 Depth: (cm) 3.7 Area: (cm) 0.754 Volume: (cm) 2.79 % Reduction in Area: 92.8% % Reduction in Volume: 91.1% Epithelialization: Small (1-33%) Tunneling: No Undermining: No Wound Description Classification: Full Thickness With Exposed Suppo Wound Margin: Epibole Exudate Amount: Medium Exudate Type: Serous Exudate Color: amber rt Structures Foul Odor After Cleansing: No Slough/Fibrino Yes Wound Bed Granulation Amount: Large (67-100%) Exposed Structure Granulation Quality: Red Fascia Exposed: No Necrotic Amount: Small (1-33%) Fat Layer (Subcutaneous Tissue) Exposed: Yes Necrotic Quality: Adherent Slough Tendon Exposed: No Muscle Exposed: No Joint Exposed: No Bone Exposed: No Periwound Skin Texture Texture Color No Abnormalities Noted: Yes No Abnormalities Noted: Yes Moisture Temperature / Pain No Abnormalities Noted: Yes Temperature: No Abnormality Tenderness on Palpation: Yes Treatment Notes Wound #2 (Gluteus) Wound Laterality: Right Cleanser Peri-Wound Care Topical Primary Dressing Dakin's Solution 0.25%, 16 (oz) Discharge Instruction: Moisten gauze with Dakin's solution Promogran Prisma Matrix, 4.34 (sq in) (silver collagen) Discharge Instruction: Moisten collagen with saline or hydrogel Secondary Dressing Zetuvit Plus Silicone Border Heel Dressing10x10 (in/in) Discharge Instruction: Apply silicone border over primary dressing as directed. Secured With Compression Wrap Compression  Stockings Facilities manager) Signed: 10/12/2022 4:41:09 PM By: Zenaida Deed RN, BSN Entered By: Zenaida Deed on 10/12/2022 10:56:08 [name] ([id number]) [id number] Page 9 of 10 -------------------------------------------------------------------------------- Wound Assessment Details Patient Name: Date of Service: Andrea Yu 10/12/2022 1:15 PM Medical Record Number: [id number] Patient Account Number: [id number] Date of Birth/Sex: Treating RN: 03-13-1940 (82 y.o. [name] Primary Care Elleen Coulibaly: Jarome Matin Other Clinician: Referring Christiana Gurevich: Treating Devonne Kitchen/Extender: Marena Chancy in Treatment: 46 Wound Status Wound Number: 4 Primary Pressure Ulcer Etiology: Wound Location: Left, Lateral Ankle Wound Open Wounding Event: Pressure Injury Status: Date Acquired: 12/23/2021 Comorbid Cataracts, Arrhythmia, Hypertension, Osteoarthritis, Weeks Of Treatment: 40 History: Osteomyelitis, Neuropathy, Confinement Anxiety Clustered Wound: No Photos Wound Measurements Length: (cm) 0 Width: (cm) 0 Depth: (cm) 0 Area: (cm) Volume: (cm) .8 % Reduction in Area: -212.1% .7 % Reduction in Volume: -528.6% .2 Epithelialization: None 0.44 Tunneling: No 0.088 Undermining: No Wound Description Classification: Category/Stage IV Wound Margin: Distinct, outline attached Exudate Amount: Medium Exudate Type: Serous Exudate Color: amber Foul Odor After Cleansing: No Slough/Fibrino Yes Wound Bed Granulation Amount: Medium (34-66%) Exposed Structure Granulation Quality: Pink, Pale Fascia Exposed: No Necrotic Amount: Medium (34-66%) Fat Layer (Subcutaneous  Pain Temperature: No Abnormality Treatment Notes Wound #1 (Malleolus) Wound Laterality: Right, Medial Cleanser Peri-Wound Care Topical Primary Dressing Promogran Prisma Matrix, 4.34 (sq in) (silver collagen) Discharge Instruction: Moisten collagen with saline or hydrogel Secondary Dressing Woven Gauze Sponge, Non-Sterile 4x4 in Discharge Instruction: Apply over primary dressing as directed. Secured With [employer], 4.5x3.1 (in/yd) Discharge Instruction: Secure with Kerlix as directed. Compression Wrap Compression Stockings Add-Ons Electronic Signature(s) Signed: 10/12/2022 4:41:09 PM By: Zenaida Deed RN, BSN Entered By: Zenaida Deed on 10/12/2022 10:58:09 -------------------------------------------------------------------------------- Wound Assessment Details Patient Name: Date of Service: Andrea Yu 10/12/2022 1:15 PM Medical Record Number: [id number] Patient Account Number: [id number] Date of Birth/Sex: Treating RN: 02-04-1940 (82 y.o. [name] Primary Care Merida Alcantar: Jarome Matin Other Clinician: Referring Kenzy Campoverde: Treating Fitzpatrick Alberico/Extender: Marena Chancy  in Treatment: 46 Wound Status Wound Number: 2 Primary Abscess Etiology: Wound Location: Right Gluteus Wound Open Wounding Event: Bump Status: Date Acquired: 09/28/2021 Comorbid Cataracts, Arrhythmia, Hypertension, Osteoarthritis, Weeks Of Treatment: 46 History: Osteomyelitis, Neuropathy, Confinement Anxiety Clustered Wound: No Photos DARIYAN, THAMMAVONG A ([id number]) [id number] Page 8 of 10 Wound Measurements Length: (cm) 0.8 Width: (cm) 1.2 Depth: (cm) 3.7 Area: (cm) 0.754 Volume: (cm) 2.79 % Reduction in Area: 92.8% % Reduction in Volume: 91.1% Epithelialization: Small (1-33%) Tunneling: No Undermining: No Wound Description Classification: Full Thickness With Exposed Suppo Wound Margin: Epibole Exudate Amount: Medium Exudate Type: Serous Exudate Color: amber rt Structures Foul Odor After Cleansing: No Slough/Fibrino Yes Wound Bed Granulation Amount: Large (67-100%) Exposed Structure Granulation Quality: Red Fascia Exposed: No Necrotic Amount: Small (1-33%) Fat Layer (Subcutaneous Tissue) Exposed: Yes Necrotic Quality: Adherent Slough Tendon Exposed: No Muscle Exposed: No Joint Exposed: No Bone Exposed: No Periwound Skin Texture Texture Color No Abnormalities Noted: Yes No Abnormalities Noted: Yes Moisture Temperature / Pain No Abnormalities Noted: Yes Temperature: No Abnormality Tenderness on Palpation: Yes Treatment Notes Wound #2 (Gluteus) Wound Laterality: Right Cleanser Peri-Wound Care Topical Primary Dressing Dakin's Solution 0.25%, 16 (oz) Discharge Instruction: Moisten gauze with Dakin's solution Promogran Prisma Matrix, 4.34 (sq in) (silver collagen) Discharge Instruction: Moisten collagen with saline or hydrogel Secondary Dressing Zetuvit Plus Silicone Border Heel Dressing10x10 (in/in) Discharge Instruction: Apply silicone border over primary dressing as directed. Secured With Compression Wrap Compression  Stockings Facilities manager) Signed: 10/12/2022 4:41:09 PM By: Zenaida Deed RN, BSN Entered By: Zenaida Deed on 10/12/2022 10:56:08 [name] ([id number]) [id number] Page 9 of 10 -------------------------------------------------------------------------------- Wound Assessment Details Patient Name: Date of Service: Andrea Yu 10/12/2022 1:15 PM Medical Record Number: [id number] Patient Account Number: [id number] Date of Birth/Sex: Treating RN: 03-13-1940 (82 y.o. [name] Primary Care Elleen Coulibaly: Jarome Matin Other Clinician: Referring Christiana Gurevich: Treating Devonne Kitchen/Extender: Marena Chancy in Treatment: 46 Wound Status Wound Number: 4 Primary Pressure Ulcer Etiology: Wound Location: Left, Lateral Ankle Wound Open Wounding Event: Pressure Injury Status: Date Acquired: 12/23/2021 Comorbid Cataracts, Arrhythmia, Hypertension, Osteoarthritis, Weeks Of Treatment: 40 History: Osteomyelitis, Neuropathy, Confinement Anxiety Clustered Wound: No Photos Wound Measurements Length: (cm) 0 Width: (cm) 0 Depth: (cm) 0 Area: (cm) Volume: (cm) .8 % Reduction in Area: -212.1% .7 % Reduction in Volume: -528.6% .2 Epithelialization: None 0.44 Tunneling: No 0.088 Undermining: No Wound Description Classification: Category/Stage IV Wound Margin: Distinct, outline attached Exudate Amount: Medium Exudate Type: Serous Exudate Color: amber Foul Odor After Cleansing: No Slough/Fibrino Yes Wound Bed Granulation Amount: Medium (34-66%) Exposed Structure Granulation Quality: Pink, Pale Fascia Exposed: No Necrotic Amount: Medium (34-66%) Fat Layer (Subcutaneous  Anxiety Neuropathy, Confinement Anxiety 11/16/2021 09/28/2021 12/23/2021 Date Acquired: 46 46 40 Weeks of Treatment: Open Open Open Wound Status: No No No Wound Recurrence: 0.8x0.8x0.1 0.8x1.2x3.7 0.8x0.7x0.2 Measurements L x W x D (cm) 0.503 0.754 0.44 A (cm) : rea 0.05 2.79 0.088 Volume (cm) : 67.30% 92.80% -212.10% % Reduction in A rea: 67.50% 91.10% -528.60% % Reduction in Volume: Category/Stage III Full Thickness With Exposed Support Category/Stage IV Classification: Structures Medium Medium Medium Exudate A mount: Serosanguineous Serous Serous Exudate Type: red, brown amber amber Exudate Color: Distinct, outline attached Epibole Distinct, outline attached Wound Margin: Large (67-100%) Large (67-100%) Medium (34-66%) Granulation A mount: Pink, Pale Red Pink, Pale Granulation Quality: Small (1-33%) Small (1-33%) Medium (34-66%) Necrotic A mount: Fat Layer (Subcutaneous Tissue): Yes Fat  Layer (Subcutaneous Tissue): Yes Fat Layer (Subcutaneous Tissue): Yes Exposed Structures: Fascia: No Fascia: No Fascia: No Tendon: No Tendon: No Tendon: No Muscle: No Muscle: No Muscle: No Joint: No Joint: No Joint: No Bone: No Bone: No Bone: No Small (1-33%) Small (1-33%) None Epithelialization: Debridement - Excisional N/A Debridement - Excisional Debridement: Pre-procedure Verification/Time Out 14:00 N/A 14:00 Taken: Lidocaine 4% Topical Solution N/A Lidocaine 4% Topical Solution Pain Control: Subcutaneous, Slough N/A Subcutaneous, Slough Tissue Debrided: Skin/Subcutaneous Tissue N/A Skin/Subcutaneous Tissue Level: 0.5 N/A 4.4 Debridement A (sq cm): rea Curette N/A Curette Instrument: Minimum N/A Minimum Bleeding: Pressure N/A Pressure Hemostasis A chieved: 0 N/A 0 Procedural Pain: 0 N/A 0 Post Procedural Pain: Procedure was tolerated well N/A Procedure was tolerated well Debridement Treatment Response: 0.8x0.8x0.1 N/A 0.8x7x0.2 Post Debridement Measurements L x W x D (cm) 0.05 N/A 0.88 Post Debridement Volume: (cm) Category/Stage III N/A Category/Stage IV Post Debridement Stage: No Abnormalities Noted No Abnormalities Noted No Abnormalities Noted Periwound Skin Texture: No Abnormalities Noted No Abnormalities Noted No Abnormalities Noted Periwound Skin Moisture: Erythema: Yes Erythema: No Erythema: No Periwound Skin Color: Rubor: No Measured: 1.5cm N/A N/A Erythema Measurement: No Abnormality No Abnormality No Abnormality Temperature: N/A Yes Yes Tenderness on Palpation: Debridement N/A Debridement Procedures Performed: Treatment Notes Electronic Signature(s) Signed: 10/12/2022 2:11:53 PM By: Duanne Guess MD FACS Entered By: Duanne Guess on 10/12/2022 11:11:52 -------------------------------------------------------------------------------- Multi-Disciplinary Care Plan Details Patient Name: Date of Service: [name]  10/12/2022 1:15 PM Medical Record Number: [id number] Patient Account Number: [id number] MERYLE, KAHLEY A ([id number]) [id number] Page 4 of 10 Date of Birth/Sex: Treating RN: Jul 06, 1940 (82 y.o. [name] Primary Care Demorris Choyce: Other Clinician: Jarome Matin Referring Abrian Hanover: Treating Avelina Mcclurkin/Extender: Marena Chancy in Treatment: 46 Multidisciplinary Care Plan reviewed with physician Active Inactive Pressure Nursing Diagnoses: Knowledge deficit related to causes and risk factors for pressure ulcer development Knowledge deficit related to management of pressures ulcers Potential for impaired tissue integrity related to pressure, friction, moisture, and shear Goals: Patient/caregiver will verbalize understanding of pressure ulcer management Date Initiated: 11/24/2021 Target Resolution Date: 11/24/2022 Goal Status: Active Interventions: Assess: immobility, friction, shearing, incontinence upon admission and as needed Assess offloading mechanisms upon admission and as needed Assess potential for pressure ulcer upon admission and as needed Provide education on pressure ulcers Treatment Activities: Pressure reduction/relief device ordered : 11/24/2021 Notes: Wound/Skin Impairment Nursing Diagnoses: Impaired tissue integrity Knowledge deficit related to ulceration/compromised skin integrity Goals: Patient/caregiver will verbalize understanding of skin care regimen Date Initiated: 11/24/2021 Target Resolution Date: 11/24/2022 Goal Status: Active Ulcer/skin breakdown will have a volume reduction of 30% by week 4 Date Initiated: 11/24/2021 Date Inactivated: 05/31/2022 Target Resolution Date: 05/25/2022 Unmet Reason: refuses VAC, Goal

## 2022-10-12 NOTE — Progress Notes (Signed)
Home Health New wound care orders this week; continue Home Health for wound care. May utilize formulary equivalent dressing for wound treatment orders unless otherwise specified. Dressing changes to be completed by Home Health on Monday / Wednesday / Friday except when patient has scheduled visit at [facility]. Other Home Health Orders/Instructions: - [facility] Wound Treatment Wound #1 - Malleolus Wound Laterality: Right, Medial Prim Dressing: Promogran Prisma Matrix, 4.34 (sq in) (silver collagen) 3 x Per Week/30 Days ary Discharge Instructions: Moisten collagen with saline or hydrogel Secondary Dressing: Woven Gauze Sponge, Non-Sterile 4x4 in 3 x Per Week/30 Days Discharge Instructions: Apply over primary dressing as directed. Secured With: [employer], 4.5x3.1 (in/yd) 3 x Per Week/30 Days Discharge Instructions: Secure with Kerlix as directed. Wound #2 - Gluteus Wound Laterality: Right Prim Dressing: Dakin's Solution 0.25%, 16 (oz) 1 x Per Day/30 Days ary Discharge Instructions: Moisten gauze with Dakin's solution Prim Dressing: Promogran Prisma Matrix, 4.34 (sq in) (silver collagen) 1 x Per Day/30 Days ary Discharge Instructions: Moisten collagen with saline or hydrogel Secondary Dressing: Zetuvit Plus Silicone Border Heel Dressing10x10 (in/in) 1 x Per Day/30 Days Discharge Instructions:  Apply silicone border over primary dressing as directed. Wound #4 - Ankle Wound Laterality: Left, Lateral Prim Dressing: Promogran Prisma Matrix, 4.34 (sq in) (silver collagen) 3 x Per Week/30 Days ary Discharge Instructions: Moisten collagen with saline or hydrogel Secondary Dressing: Woven Gauze Sponge, Non-Sterile 4x4 in 3 x Per Week/30 Days Discharge Instructions: Apply over primary dressing as directed. Secured With: [employer], 4.5x3.1 (in/yd) 3 x Per Week/30 Days Discharge Instructions: Secure with Kerlix as directed. Electronic Signature(s) Signed: 10/12/2022 2:51:21 PM By: Duanne Guess MD FACS Entered By: Duanne Guess on 10/12/2022 11:19:43 -------------------------------------------------------------------------------- Problem List Details Patient Name: Date of Service: Andrea Yu 10/12/2022 1:15 PM Medical Record Number: [id number] Patient Account Number: [id number] Date of Birth/Sex: Treating RN: [birth year] (81 y.o. 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/01/2022 No Yes evidence of necrosis L02.31 Cutaneous abscess of buttock 11/24/2021 No Yes E27.40 Unspecified adrenocortical insufficiency 11/24/2021 No Yes Z79.52 Long term (current) use of systemic steroids 11/24/2021 No Yes I10 Essential (primary) hypertension 11/24/2021 No Yes Inactive Problems ICD-10 Code Description Active Date Inactive Date L97.812 Non-pressure chronic ulcer of other part of  right lower leg with fat layer exposed 07/21/2022 07/21/2022 Resolved Problems ICD-10 Code Description Active Date Resolved Date L97.511 Non-pressure chronic ulcer of other part of right foot limited to breakdown of skin 12/04/2021 12/04/2021 Electronic Signature(s) Signed: 10/12/2022 2:09:52 PM By: Duanne Guess MD FACS Entered By: Duanne Guess on 10/12/2022 11:09:52 -------------------------------------------------------------------------------- Progress Note Details Patient Name: Date of Service: Andrea Yu 10/12/2022 1:15 PM Medical Record Number: [id number] Patient Account Number: [id number] Date of Birth/Sex: Treating RN: 01-25-1941 (82 y.o. F) Primary Care Provider: Jarome Yu Other Clinician: Referring Provider: Treating Provider/Extender: Andrea Yu in Treatment: [address], [place] A ([id number]) [id number] Page 7 of 12 Subjective Chief Complaint Information obtained from Patient Patient is at the clinic for treatment of an open pressure ulcer on her right medial ankle, and a  Y A. 10/12/2022 1:15 PM Medical Record Number: [id number] Patient Account Number: [id number] Date of Birth/Sex: Treating RN: 01-08-1941 (82 y.o. 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 She is not diabetic and quit smoking over 40 years ago. 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 She has developed a pressure ulcer on the right medial malleolus. In addition, she apparently has had multiple cutaneous abscesses on her buttocks that have required repeated incision and drainage procedures. She has developed a large abscess on her right buttock that has become painful. She was recently treated with cephalexin by her PCP for urinary tract infection and also received a shot of Rocephin for the abscess, but it  has not yet been incised or drained. She is nonambulatory but is able to stand to transfer. She does not wear any sort of Prevalon boot. ABI in clinic today was 0.96. On her right medial malleolus, there is a circular ulcer. The surface is dry and fibrotic. There is some periwound erythema, but no malodor or purulent drainage. On her right buttock, there is a large fluctuant bulge about the size of an orange. It is also erythematous and tender. 12/04/2021: The culture from her abscess grew out staph. A 10-day course of Bactrim was prescribed and she is currently taking this. Unfortunately, Dakin's solution was not delivered and only 2 x 2 gauze was sent, rather than Kerlix so the packing of the wound has been rather suboptimal. The wound cavity has light slough over all of the surfaces. Although covered, bone is palpable. The medial ankle wound looks about the same with a layer of slough accumulation. In addition, her daughter peeled some dry skin off of her foot this morning and she now has denuded areas over the majority of the plantar surface of her right foot. 01/01/2022: The patient has missed several visits and to her daughters report that it is somewhat difficult to get her here on a weekly basis. In the interim, she has developed a new pressure ulcer on her left lateral malleolus. The plantar surface of her right foot has healed. The cavity on her right buttock has contracted, but it remains quite deep and the trochanter, while not exposed, is palpable at the base. The pressure ulcer on her right medial malleolus is basically unchanged. 02/09/2022: All wounds are little bit smaller today. As the buttock abscess cavity has contracted, it has left some tunneling that has not been adequately packed. Light slough accumulation on both ankle wounds. 02/26/2022: All of the wounds continue to contract. There is slough on both ankle wounds. The buttock ulcer is clean. She brought her wound VAC with her  today. 03/12/2022: The ankle wounds are about the same this week with slough accumulation. The buttock ulcer is smaller and quite clean. She decided that she could not tolerate the discomfort of the wound VAC and removed it. They have been packing the wound with Dakin's moistened gauze. 03/25/2022: The ankle wounds are slightly smaller. Both have slough accumulation. The orifice of the buttock ulcer has gotten quite a bit smaller than the underlying cavity. She has decided that she would like to have the wound VAC back. 3/14; patient presents for follow-up. She has bilateral ankle  Home Health New wound care orders this week; continue Home Health for wound care. May utilize formulary equivalent dressing for wound treatment orders unless otherwise specified. Dressing changes to be completed by Home Health on Monday / Wednesday / Friday except when patient has scheduled visit at [facility]. Other Home Health Orders/Instructions: - [facility] Wound Treatment Wound #1 - Malleolus Wound Laterality: Right, Medial Prim Dressing: Promogran Prisma Matrix, 4.34 (sq in) (silver collagen) 3 x Per Week/30 Days ary Discharge Instructions: Moisten collagen with saline or hydrogel Secondary Dressing: Woven Gauze Sponge, Non-Sterile 4x4 in 3 x Per Week/30 Days Discharge Instructions: Apply over primary dressing as directed. Secured With: [employer], 4.5x3.1 (in/yd) 3 x Per Week/30 Days Discharge Instructions: Secure with Kerlix as directed. Wound #2 - Gluteus Wound Laterality: Right Prim Dressing: Dakin's Solution 0.25%, 16 (oz) 1 x Per Day/30 Days ary Discharge Instructions: Moisten gauze with Dakin's solution Prim Dressing: Promogran Prisma Matrix, 4.34 (sq in) (silver collagen) 1 x Per Day/30 Days ary Discharge Instructions: Moisten collagen with saline or hydrogel Secondary Dressing: Zetuvit Plus Silicone Border Heel Dressing10x10 (in/in) 1 x Per Day/30 Days Discharge Instructions:  Apply silicone border over primary dressing as directed. Wound #4 - Ankle Wound Laterality: Left, Lateral Prim Dressing: Promogran Prisma Matrix, 4.34 (sq in) (silver collagen) 3 x Per Week/30 Days ary Discharge Instructions: Moisten collagen with saline or hydrogel Secondary Dressing: Woven Gauze Sponge, Non-Sterile 4x4 in 3 x Per Week/30 Days Discharge Instructions: Apply over primary dressing as directed. Secured With: [employer], 4.5x3.1 (in/yd) 3 x Per Week/30 Days Discharge Instructions: Secure with Kerlix as directed. Electronic Signature(s) Signed: 10/12/2022 2:51:21 PM By: Duanne Guess MD FACS Entered By: Duanne Guess on 10/12/2022 11:19:43 -------------------------------------------------------------------------------- Problem List Details Patient Name: Date of Service: Andrea Yu 10/12/2022 1:15 PM Medical Record Number: [id number] Patient Account Number: [id number] Date of Birth/Sex: Treating RN: [birth year] (81 y.o. 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/01/2022 No Yes evidence of necrosis L02.31 Cutaneous abscess of buttock 11/24/2021 No Yes E27.40 Unspecified adrenocortical insufficiency 11/24/2021 No Yes Z79.52 Long term (current) use of systemic steroids 11/24/2021 No Yes I10 Essential (primary) hypertension 11/24/2021 No Yes Inactive Problems ICD-10 Code Description Active Date Inactive Date L97.812 Non-pressure chronic ulcer of other part of  right lower leg with fat layer exposed 07/21/2022 07/21/2022 Resolved Problems ICD-10 Code Description Active Date Resolved Date L97.511 Non-pressure chronic ulcer of other part of right foot limited to breakdown of skin 12/04/2021 12/04/2021 Electronic Signature(s) Signed: 10/12/2022 2:09:52 PM By: Duanne Guess MD FACS Entered By: Duanne Guess on 10/12/2022 11:09:52 -------------------------------------------------------------------------------- Progress Note Details Patient Name: Date of Service: Andrea Yu 10/12/2022 1:15 PM Medical Record Number: [id number] Patient Account Number: [id number] Date of Birth/Sex: Treating RN: 01-25-1941 (82 y.o. F) Primary Care Provider: Jarome Yu Other Clinician: Referring Provider: Treating Provider/Extender: Andrea Yu in Treatment: [address], [place] A ([id number]) [id number] Page 7 of 12 Subjective Chief Complaint Information obtained from Patient Patient is at the clinic for treatment of an open pressure ulcer on her right medial ankle, and a  Y A. 10/12/2022 1:15 PM Medical Record Number: [id number] Patient Account Number: [id number] Date of Birth/Sex: Treating RN: 01-08-1941 (82 y.o. 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 She is not diabetic and quit smoking over 40 years ago. 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 She has developed a pressure ulcer on the right medial malleolus. In addition, she apparently has had multiple cutaneous abscesses on her buttocks that have required repeated incision and drainage procedures. She has developed a large abscess on her right buttock that has become painful. She was recently treated with cephalexin by her PCP for urinary tract infection and also received a shot of Rocephin for the abscess, but it  has not yet been incised or drained. She is nonambulatory but is able to stand to transfer. She does not wear any sort of Prevalon boot. ABI in clinic today was 0.96. On her right medial malleolus, there is a circular ulcer. The surface is dry and fibrotic. There is some periwound erythema, but no malodor or purulent drainage. On her right buttock, there is a large fluctuant bulge about the size of an orange. It is also erythematous and tender. 12/04/2021: The culture from her abscess grew out staph. A 10-day course of Bactrim was prescribed and she is currently taking this. Unfortunately, Dakin's solution was not delivered and only 2 x 2 gauze was sent, rather than Kerlix so the packing of the wound has been rather suboptimal. The wound cavity has light slough over all of the surfaces. Although covered, bone is palpable. The medial ankle wound looks about the same with a layer of slough accumulation. In addition, her daughter peeled some dry skin off of her foot this morning and she now has denuded areas over the majority of the plantar surface of her right foot. 01/01/2022: The patient has missed several visits and to her daughters report that it is somewhat difficult to get her here on a weekly basis. In the interim, she has developed a new pressure ulcer on her left lateral malleolus. The plantar surface of her right foot has healed. The cavity on her right buttock has contracted, but it remains quite deep and the trochanter, while not exposed, is palpable at the base. The pressure ulcer on her right medial malleolus is basically unchanged. 02/09/2022: All wounds are little bit smaller today. As the buttock abscess cavity has contracted, it has left some tunneling that has not been adequately packed. Light slough accumulation on both ankle wounds. 02/26/2022: All of the wounds continue to contract. There is slough on both ankle wounds. The buttock ulcer is clean. She brought her wound VAC with her  today. 03/12/2022: The ankle wounds are about the same this week with slough accumulation. The buttock ulcer is smaller and quite clean. She decided that she could not tolerate the discomfort of the wound VAC and removed it. They have been packing the wound with Dakin's moistened gauze. 03/25/2022: The ankle wounds are slightly smaller. Both have slough accumulation. The orifice of the buttock ulcer has gotten quite a bit smaller than the underlying cavity. She has decided that she would like to have the wound VAC back. 3/14; patient presents for follow-up. She has bilateral ankle  Home Health New wound care orders this week; continue Home Health for wound care. May utilize formulary equivalent dressing for wound treatment orders unless otherwise specified. Dressing changes to be completed by Home Health on Monday / Wednesday / Friday except when patient has scheduled visit at [facility]. Other Home Health Orders/Instructions: - [facility] Wound Treatment Wound #1 - Malleolus Wound Laterality: Right, Medial Prim Dressing: Promogran Prisma Matrix, 4.34 (sq in) (silver collagen) 3 x Per Week/30 Days ary Discharge Instructions: Moisten collagen with saline or hydrogel Secondary Dressing: Woven Gauze Sponge, Non-Sterile 4x4 in 3 x Per Week/30 Days Discharge Instructions: Apply over primary dressing as directed. Secured With: [employer], 4.5x3.1 (in/yd) 3 x Per Week/30 Days Discharge Instructions: Secure with Kerlix as directed. Wound #2 - Gluteus Wound Laterality: Right Prim Dressing: Dakin's Solution 0.25%, 16 (oz) 1 x Per Day/30 Days ary Discharge Instructions: Moisten gauze with Dakin's solution Prim Dressing: Promogran Prisma Matrix, 4.34 (sq in) (silver collagen) 1 x Per Day/30 Days ary Discharge Instructions: Moisten collagen with saline or hydrogel Secondary Dressing: Zetuvit Plus Silicone Border Heel Dressing10x10 (in/in) 1 x Per Day/30 Days Discharge Instructions:  Apply silicone border over primary dressing as directed. Wound #4 - Ankle Wound Laterality: Left, Lateral Prim Dressing: Promogran Prisma Matrix, 4.34 (sq in) (silver collagen) 3 x Per Week/30 Days ary Discharge Instructions: Moisten collagen with saline or hydrogel Secondary Dressing: Woven Gauze Sponge, Non-Sterile 4x4 in 3 x Per Week/30 Days Discharge Instructions: Apply over primary dressing as directed. Secured With: [employer], 4.5x3.1 (in/yd) 3 x Per Week/30 Days Discharge Instructions: Secure with Kerlix as directed. Electronic Signature(s) Signed: 10/12/2022 2:51:21 PM By: Duanne Guess MD FACS Entered By: Duanne Guess on 10/12/2022 11:19:43 -------------------------------------------------------------------------------- Problem List Details Patient Name: Date of Service: Andrea Yu 10/12/2022 1:15 PM Medical Record Number: [id number] Patient Account Number: [id number] Date of Birth/Sex: Treating RN: [birth year] (81 y.o. 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/01/2022 No Yes evidence of necrosis L02.31 Cutaneous abscess of buttock 11/24/2021 No Yes E27.40 Unspecified adrenocortical insufficiency 11/24/2021 No Yes Z79.52 Long term (current) use of systemic steroids 11/24/2021 No Yes I10 Essential (primary) hypertension 11/24/2021 No Yes Inactive Problems ICD-10 Code Description Active Date Inactive Date L97.812 Non-pressure chronic ulcer of other part of  right lower leg with fat layer exposed 07/21/2022 07/21/2022 Resolved Problems ICD-10 Code Description Active Date Resolved Date L97.511 Non-pressure chronic ulcer of other part of right foot limited to breakdown of skin 12/04/2021 12/04/2021 Electronic Signature(s) Signed: 10/12/2022 2:09:52 PM By: Duanne Guess MD FACS Entered By: Duanne Guess on 10/12/2022 11:09:52 -------------------------------------------------------------------------------- Progress Note Details Patient Name: Date of Service: Andrea Yu 10/12/2022 1:15 PM Medical Record Number: [id number] Patient Account Number: [id number] Date of Birth/Sex: Treating RN: 01-25-1941 (82 y.o. F) Primary Care Provider: Jarome Yu Other Clinician: Referring Provider: Treating Provider/Extender: Andrea Yu in Treatment: [address], [place] ([id number]) [id number] Page 7 of 12 Subjective Chief Complaint Information obtained from Patient Patient is at the clinic for treatment of an open pressure ulcer on her right medial ankle, and a  index finger into the wound. Both ankle wounds measured slightly smaller. Both have a rather dry skin edge but good granulation tissue present with minimal slough. 09/08/2022: The buttock ulcer cavity is smaller again today. It is shallower and narrower. Both ankle wounds were a little bit smaller, as well. There is a fair amount of slough on the left lateral ankle wound. 09/14/2022: Unfortunately, the buttock ulcer cavity measured a little larger and a little deeper today. The patient did say she has been sitting a bit more of late. The right medial ankle wound is about the same size; the left lateral ankle wound is a little smaller and shallower. There is an area on the medial aspect of her Andrea Yu, Andrea Yu ([id number]) [phone].pdf Page 4 of 12 right calcaneus that looks concerning for potential pressure-induced deep tissue injury. 09/29/2022: Both ankle wounds measured smaller today. Underneath the dried-on [place] and slough, there is actually fairly healthy-looking granulation tissue filling in. The gluteal ulcer is narrower and tighter, but the depth has not really decreased at all. 10/05/2022: Once again, both ankle wounds measured slightly smaller. The tissue surfaces look healthy. The gluteal ulcer continues to contract circumferentially, but the depth remains stable. 10/12/2022: The depth on the gluteal ulcer came in by about half a centimeter this week. Both ankles look about the same with rubbery slough overlying granulation tissue. The granulation tissue is not quite as pink and robust-appearing as on previous occasions. Electronic Signature(s) Signed: 10/12/2022 2:18:40 PM By: Duanne Guess MD  FACS Entered By: Duanne Guess on 10/12/2022 11:18:40 -------------------------------------------------------------------------------- Physical Exam Details Patient Name: Date of Service: Andrea Yu 10/12/2022 1:15 PM Medical Record Number: [id number] Patient Account Number: [id number] Date of Birth/Sex: Treating RN: February 20, 1940 (81 y.o. F) Primary Care Provider: Jarome Yu Other Clinician: Referring Provider: Treating Provider/Extender: Andrea Yu in Treatment: 46 Constitutional Slightly hypertensive. . . . no acute distress. Respiratory Normal work of breathing on room air. Notes 10/12/2022: The depth on the gluteal ulcer came in by about half a centimeter this week. Both ankles look about the same with rubbery slough overlying granulation tissue. The granulation tissue is not quite as pink and robust-appearing as on previous occasions. Electronic Signature(s) Signed: 10/12/2022 2:19:21 PM By: Duanne Guess MD FACS Entered By: Duanne Guess on 10/12/2022 11:19:20 -------------------------------------------------------------------------------- Physician Orders Details Patient Name: Date of Service: Andrea Yu 10/12/2022 1:15 PM Medical Record Number: [id number] Patient Account Number: [id number] Date of Birth/Sex: Treating RN: 1940/06/24 (81 y.o. 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 - Dr. Lady Gary - Room 1 Return A Tuesday 9/24 @ 1:15 pm Anesthetic (In clinic) Topical Lidocaine 4% applied to wound bed Bathing/ Shower/ Hygiene May shower and wash wound with soap and water. - with dressing changes Off-Loading Multipodus Splint to: - prevalon boot to both feet Turn and reposition every 2 hours  index finger into the wound. Both ankle wounds measured slightly smaller. Both have a rather dry skin edge but good granulation tissue present with minimal slough. 09/08/2022: The buttock ulcer cavity is smaller again today. It is shallower and narrower. Both ankle wounds were a little bit smaller, as well. There is a fair amount of slough on the left lateral ankle wound. 09/14/2022: Unfortunately, the buttock ulcer cavity measured a little larger and a little deeper today. The patient did say she has been sitting a bit more of late. The right medial ankle wound is about the same size; the left lateral ankle wound is a little smaller and shallower. There is an area on the medial aspect of her Andrea Yu, Andrea Yu ([id number]) [phone].pdf Page 4 of 12 right calcaneus that looks concerning for potential pressure-induced deep tissue injury. 09/29/2022: Both ankle wounds measured smaller today. Underneath the dried-on Oasis and slough, there is actually fairly healthy-looking granulation tissue filling in. The gluteal ulcer is narrower and tighter, but the depth has not really decreased at all. 10/05/2022: Once again, both ankle wounds measured slightly smaller. The tissue surfaces look healthy. The gluteal ulcer continues to contract circumferentially, but the depth remains stable. 10/12/2022: The depth on the gluteal ulcer came in by about half a centimeter this week. Both ankles look about the same with rubbery slough overlying granulation tissue. The granulation tissue is not quite as pink and robust-appearing as on previous occasions. Electronic Signature(s) Signed: 10/12/2022 2:18:40 PM By: Duanne Guess MD  FACS Entered By: Duanne Guess on 10/12/2022 11:18:40 -------------------------------------------------------------------------------- Physical Exam Details Patient Name: Date of Service: Andrea Yu 10/12/2022 1:15 PM Medical Record Number: [id number] Patient Account Number: [id number] Date of Birth/Sex: Treating RN: February 20, 1940 (81 y.o. F) Primary Care Provider: Jarome Yu Other Clinician: Referring Provider: Treating Provider/Extender: Andrea Yu in Treatment: 46 Constitutional Slightly hypertensive. . . . no acute distress. Respiratory Normal work of breathing on room air. Notes 10/12/2022: The depth on the gluteal ulcer came in by about half a centimeter this week. Both ankles look about the same with rubbery slough overlying granulation tissue. The granulation tissue is not quite as pink and robust-appearing as on previous occasions. Electronic Signature(s) Signed: 10/12/2022 2:19:21 PM By: Duanne Guess MD FACS Entered By: Duanne Guess on 10/12/2022 11:19:20 -------------------------------------------------------------------------------- Physician Orders Details Patient Name: Date of Service: Andrea Yu 10/12/2022 1:15 PM Medical Record Number: [id number] Patient Account Number: [id number] Date of Birth/Sex: Treating RN: 1940/06/24 (81 y.o. 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 - Dr. Lady Gary - Room 1 Return A Tuesday 9/24 @ 1:15 pm Anesthetic (In clinic) Topical Lidocaine 4% applied to wound bed Bathing/ Shower/ Hygiene May shower and wash wound with soap and water. - with dressing changes Off-Loading Multipodus Splint to: - prevalon boot to both feet Turn and reposition every 2 hours  Y A. 10/12/2022 1:15 PM Medical Record Number: [id number] Patient Account Number: [id number] Date of Birth/Sex: Treating RN: 01-08-1941 (82 y.o. 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 She is not diabetic and quit smoking over 40 years ago. 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 She has developed a pressure ulcer on the right medial malleolus. In addition, she apparently has had multiple cutaneous abscesses on her buttocks that have required repeated incision and drainage procedures. She has developed a large abscess on her right buttock that has become painful. She was recently treated with cephalexin by her PCP for urinary tract infection and also received a shot of Rocephin for the abscess, but it  has not yet been incised or drained. She is nonambulatory but is able to stand to transfer. She does not wear any sort of Prevalon boot. ABI in clinic today was 0.96. On her right medial malleolus, there is a circular ulcer. The surface is dry and fibrotic. There is some periwound erythema, but no malodor or purulent drainage. On her right buttock, there is a large fluctuant bulge about the size of an orange. It is also erythematous and tender. 12/04/2021: The culture from her abscess grew out staph. A 10-day course of Bactrim was prescribed and she is currently taking this. Unfortunately, Dakin's solution was not delivered and only 2 x 2 gauze was sent, rather than Kerlix so the packing of the wound has been rather suboptimal. The wound cavity has light slough over all of the surfaces. Although covered, bone is palpable. The medial ankle wound looks about the same with a layer of slough accumulation. In addition, her daughter peeled some dry skin off of her foot this morning and she now has denuded areas over the majority of the plantar surface of her right foot. 01/01/2022: The patient has missed several visits and to her daughters report that it is somewhat difficult to get her here on a weekly basis. In the interim, she has developed a new pressure ulcer on her left lateral malleolus. The plantar surface of her right foot has healed. The cavity on her right buttock has contracted, but it remains quite deep and the trochanter, while not exposed, is palpable at the base. The pressure ulcer on her right medial malleolus is basically unchanged. 02/09/2022: All wounds are little bit smaller today. As the buttock abscess cavity has contracted, it has left some tunneling that has not been adequately packed. Light slough accumulation on both ankle wounds. 02/26/2022: All of the wounds continue to contract. There is slough on both ankle wounds. The buttock ulcer is clean. She brought her wound VAC with her  today. 03/12/2022: The ankle wounds are about the same this week with slough accumulation. The buttock ulcer is smaller and quite clean. She decided that she could not tolerate the discomfort of the wound VAC and removed it. They have been packing the wound with Dakin's moistened gauze. 03/25/2022: The ankle wounds are slightly smaller. Both have slough accumulation. The orifice of the buttock ulcer has gotten quite a bit smaller than the underlying cavity. She has decided that she would like to have the wound VAC back. 3/14; patient presents for follow-up. She has bilateral ankle  index finger into the wound. Both ankle wounds measured slightly smaller. Both have a rather dry skin edge but good granulation tissue present with minimal slough. 09/08/2022: The buttock ulcer cavity is smaller again today. It is shallower and narrower. Both ankle wounds were a little bit smaller, as well. There is a fair amount of slough on the left lateral ankle wound. 09/14/2022: Unfortunately, the buttock ulcer cavity measured a little larger and a little deeper today. The patient did say she has been sitting a bit more of late. The right medial ankle wound is about the same size; the left lateral ankle wound is a little smaller and shallower. There is an area on the medial aspect of her Andrea Yu, Andrea Yu ([id number]) [phone].pdf Page 4 of 12 right calcaneus that looks concerning for potential pressure-induced deep tissue injury. 09/29/2022: Both ankle wounds measured smaller today. Underneath the dried-on Oasis and slough, there is actually fairly healthy-looking granulation tissue filling in. The gluteal ulcer is narrower and tighter, but the depth has not really decreased at all. 10/05/2022: Once again, both ankle wounds measured slightly smaller. The tissue surfaces look healthy. The gluteal ulcer continues to contract circumferentially, but the depth remains stable. 10/12/2022: The depth on the gluteal ulcer came in by about half a centimeter this week. Both ankles look about the same with rubbery slough overlying granulation tissue. The granulation tissue is not quite as pink and robust-appearing as on previous occasions. Electronic Signature(s) Signed: 10/12/2022 2:18:40 PM By: Duanne Guess MD  FACS Entered By: Duanne Guess on 10/12/2022 11:18:40 -------------------------------------------------------------------------------- Physical Exam Details Patient Name: Date of Service: Andrea Yu 10/12/2022 1:15 PM Medical Record Number: [id number] Patient Account Number: [id number] Date of Birth/Sex: Treating RN: February 20, 1940 (81 y.o. F) Primary Care Provider: Jarome Yu Other Clinician: Referring Provider: Treating Provider/Extender: Andrea Yu in Treatment: 46 Constitutional Slightly hypertensive. . . . no acute distress. Respiratory Normal work of breathing on room air. Notes 10/12/2022: The depth on the gluteal ulcer came in by about half a centimeter this week. Both ankles look about the same with rubbery slough overlying granulation tissue. The granulation tissue is not quite as pink and robust-appearing as on previous occasions. Electronic Signature(s) Signed: 10/12/2022 2:19:21 PM By: Duanne Guess MD FACS Entered By: Duanne Guess on 10/12/2022 11:19:20 -------------------------------------------------------------------------------- Physician Orders Details Patient Name: Date of Service: Andrea Yu 10/12/2022 1:15 PM Medical Record Number: [id number] Patient Account Number: [id number] Date of Birth/Sex: Treating RN: 1940/06/24 (81 y.o. 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 - Dr. Lady Gary - Room 1 Return A Tuesday 9/24 @ 1:15 pm Anesthetic (In clinic) Topical Lidocaine 4% applied to wound bed Bathing/ Shower/ Hygiene May shower and wash wound with soap and water. - with dressing changes Off-Loading Multipodus Splint to: - prevalon boot to both feet Turn and reposition every 2 hours  Home Health New wound care orders this week; continue Home Health for wound care. May utilize formulary equivalent dressing for wound treatment orders unless otherwise specified. Dressing changes to be completed by Home Health on Monday / Wednesday / Friday except when patient has scheduled visit at [facility]. Other Home Health Orders/Instructions: - [facility] Wound Treatment Wound #1 - Malleolus Wound Laterality: Right, Medial Prim Dressing: Promogran Prisma Matrix, 4.34 (sq in) (silver collagen) 3 x Per Week/30 Days ary Discharge Instructions: Moisten collagen with saline or hydrogel Secondary Dressing: Woven Gauze Sponge, Non-Sterile 4x4 in 3 x Per Week/30 Days Discharge Instructions: Apply over primary dressing as directed. Secured With: [employer], 4.5x3.1 (in/yd) 3 x Per Week/30 Days Discharge Instructions: Secure with Kerlix as directed. Wound #2 - Gluteus Wound Laterality: Right Prim Dressing: Dakin's Solution 0.25%, 16 (oz) 1 x Per Day/30 Days ary Discharge Instructions: Moisten gauze with Dakin's solution Prim Dressing: Promogran Prisma Matrix, 4.34 (sq in) (silver collagen) 1 x Per Day/30 Days ary Discharge Instructions: Moisten collagen with saline or hydrogel Secondary Dressing: Zetuvit Plus Silicone Border Heel Dressing10x10 (in/in) 1 x Per Day/30 Days Discharge Instructions:  Apply silicone border over primary dressing as directed. Wound #4 - Ankle Wound Laterality: Left, Lateral Prim Dressing: Promogran Prisma Matrix, 4.34 (sq in) (silver collagen) 3 x Per Week/30 Days ary Discharge Instructions: Moisten collagen with saline or hydrogel Secondary Dressing: Woven Gauze Sponge, Non-Sterile 4x4 in 3 x Per Week/30 Days Discharge Instructions: Apply over primary dressing as directed. Secured With: [employer], 4.5x3.1 (in/yd) 3 x Per Week/30 Days Discharge Instructions: Secure with Kerlix as directed. Electronic Signature(s) Signed: 10/12/2022 2:51:21 PM By: Duanne Guess MD FACS Entered By: Duanne Guess on 10/12/2022 11:19:43 -------------------------------------------------------------------------------- Problem List Details Patient Name: Date of Service: Andrea Yu 10/12/2022 1:15 PM Medical Record Number: [id number] Patient Account Number: [id number] Date of Birth/Sex: Treating RN: [birth year] (81 y.o. 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/01/2022 No Yes evidence of necrosis L02.31 Cutaneous abscess of buttock 11/24/2021 No Yes E27.40 Unspecified adrenocortical insufficiency 11/24/2021 No Yes Z79.52 Long term (current) use of systemic steroids 11/24/2021 No Yes I10 Essential (primary) hypertension 11/24/2021 No Yes Inactive Problems ICD-10 Code Description Active Date Inactive Date L97.812 Non-pressure chronic ulcer of other part of  right lower leg with fat layer exposed 07/21/2022 07/21/2022 Resolved Problems ICD-10 Code Description Active Date Resolved Date L97.511 Non-pressure chronic ulcer of other part of right foot limited to breakdown of skin 12/04/2021 12/04/2021 Electronic Signature(s) Signed: 10/12/2022 2:09:52 PM By: Duanne Guess MD FACS Entered By: Duanne Guess on 10/12/2022 11:09:52 -------------------------------------------------------------------------------- Progress Note Details Patient Name: Date of Service: Andrea Yu 10/12/2022 1:15 PM Medical Record Number: [id number] Patient Account Number: [id number] Date of Birth/Sex: Treating RN: 01-25-1941 (82 y.o. F) Primary Care Provider: Jarome Yu Other Clinician: Referring Provider: Treating Provider/Extender: Andrea Yu in Treatment: [address], [place] A ([id number]) [id number] Page 7 of 12 Subjective Chief Complaint Information obtained from Patient Patient is at the clinic for treatment of an open pressure ulcer on her right medial ankle, and a  index finger into the wound. Both ankle wounds measured slightly smaller. Both have a rather dry skin edge but good granulation tissue present with minimal slough. 09/08/2022: The buttock ulcer cavity is smaller again today. It is shallower and narrower. Both ankle wounds were a little bit smaller, as well. There is a fair amount of slough on the left lateral ankle wound. 09/14/2022: Unfortunately, the buttock ulcer cavity measured a little larger and a little deeper today. The patient did say she has been sitting a bit more of late. The right medial ankle wound is about the same size; the left lateral ankle wound is a little smaller and shallower. There is an area on the medial aspect of her Andrea Yu, Andrea Yu ([id number]) [phone].pdf Page 4 of 12 right calcaneus that looks concerning for potential pressure-induced deep tissue injury. 09/29/2022: Both ankle wounds measured smaller today. Underneath the dried-on Oasis and slough, there is actually fairly healthy-looking granulation tissue filling in. The gluteal ulcer is narrower and tighter, but the depth has not really decreased at all. 10/05/2022: Once again, both ankle wounds measured slightly smaller. The tissue surfaces look healthy. The gluteal ulcer continues to contract circumferentially, but the depth remains stable. 10/12/2022: The depth on the gluteal ulcer came in by about half a centimeter this week. Both ankles look about the same with rubbery slough overlying granulation tissue. The granulation tissue is not quite as pink and robust-appearing as on previous occasions. Electronic Signature(s) Signed: 10/12/2022 2:18:40 PM By: Duanne Guess MD  FACS Entered By: Duanne Guess on 10/12/2022 11:18:40 -------------------------------------------------------------------------------- Physical Exam Details Patient Name: Date of Service: Andrea Yu 10/12/2022 1:15 PM Medical Record Number: [id number] Patient Account Number: [id number] Date of Birth/Sex: Treating RN: February 20, 1940 (81 y.o. F) Primary Care Provider: Jarome Yu Other Clinician: Referring Provider: Treating Provider/Extender: Andrea Yu in Treatment: 46 Constitutional Slightly hypertensive. . . . no acute distress. Respiratory Normal work of breathing on room air. Notes 10/12/2022: The depth on the gluteal ulcer came in by about half a centimeter this week. Both ankles look about the same with rubbery slough overlying granulation tissue. The granulation tissue is not quite as pink and robust-appearing as on previous occasions. Electronic Signature(s) Signed: 10/12/2022 2:19:21 PM By: Duanne Guess MD FACS Entered By: Duanne Guess on 10/12/2022 11:19:20 -------------------------------------------------------------------------------- Physician Orders Details Patient Name: Date of Service: Andrea Yu 10/12/2022 1:15 PM Medical Record Number: [id number] Patient Account Number: [id number] Date of Birth/Sex: Treating RN: 1940/06/24 (81 y.o. 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 - Dr. Lady Gary - Room 1 Return A Tuesday 9/24 @ 1:15 pm Anesthetic (In clinic) Topical Lidocaine 4% applied to wound bed Bathing/ Shower/ Hygiene May shower and wash wound with soap and water. - with dressing changes Off-Loading Multipodus Splint to: - prevalon boot to both feet Turn and reposition every 2 hours  index finger into the wound. Both ankle wounds measured slightly smaller. Both have a rather dry skin edge but good granulation tissue present with minimal slough. 09/08/2022: The buttock ulcer cavity is smaller again today. It is shallower and narrower. Both ankle wounds were a little bit smaller, as well. There is a fair amount of slough on the left lateral ankle wound. 09/14/2022: Unfortunately, the buttock ulcer cavity measured a little larger and a little deeper today. The patient did say she has been sitting a bit more of late. The right medial ankle wound is about the same size; the left lateral ankle wound is a little smaller and shallower. There is an area on the medial aspect of her Andrea Yu, Andrea Yu ([id number]) [phone].pdf Page 4 of 12 right calcaneus that looks concerning for potential pressure-induced deep tissue injury. 09/29/2022: Both ankle wounds measured smaller today. Underneath the dried-on Oasis and slough, there is actually fairly healthy-looking granulation tissue filling in. The gluteal ulcer is narrower and tighter, but the depth has not really decreased at all. 10/05/2022: Once again, both ankle wounds measured slightly smaller. The tissue surfaces look healthy. The gluteal ulcer continues to contract circumferentially, but the depth remains stable. 10/12/2022: The depth on the gluteal ulcer came in by about half a centimeter this week. Both ankles look about the same with rubbery slough overlying granulation tissue. The granulation tissue is not quite as pink and robust-appearing as on previous occasions. Electronic Signature(s) Signed: 10/12/2022 2:18:40 PM By: Duanne Guess MD  FACS Entered By: Duanne Guess on 10/12/2022 11:18:40 -------------------------------------------------------------------------------- Physical Exam Details Patient Name: Date of Service: Andrea Yu 10/12/2022 1:15 PM Medical Record Number: [id number] Patient Account Number: [id number] Date of Birth/Sex: Treating RN: February 20, 1940 (81 y.o. F) Primary Care Provider: Jarome Yu Other Clinician: Referring Provider: Treating Provider/Extender: Andrea Yu in Treatment: 46 Constitutional Slightly hypertensive. . . . no acute distress. Respiratory Normal work of breathing on room air. Notes 10/12/2022: The depth on the gluteal ulcer came in by about half a centimeter this week. Both ankles look about the same with rubbery slough overlying granulation tissue. The granulation tissue is not quite as pink and robust-appearing as on previous occasions. Electronic Signature(s) Signed: 10/12/2022 2:19:21 PM By: Duanne Guess MD FACS Entered By: Duanne Guess on 10/12/2022 11:19:20 -------------------------------------------------------------------------------- Physician Orders Details Patient Name: Date of Service: Andrea Yu 10/12/2022 1:15 PM Medical Record Number: [id number] Patient Account Number: [id number] Date of Birth/Sex: Treating RN: 1940/06/24 (81 y.o. 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 - Dr. Lady Gary - Room 1 Return A Tuesday 9/24 @ 1:15 pm Anesthetic (In clinic) Topical Lidocaine 4% applied to wound bed Bathing/ Shower/ Hygiene May shower and wash wound with soap and water. - with dressing changes Off-Loading Multipodus Splint to: - prevalon boot to both feet Turn and reposition every 2 hours

## 2022-10-19 ENCOUNTER — Ambulatory Visit (HOSPITAL_BASED_OUTPATIENT_CLINIC_OR_DEPARTMENT_OTHER): Payer: Medicare Other | Admitting: General Surgery

## 2022-10-26 ENCOUNTER — Encounter (HOSPITAL_BASED_OUTPATIENT_CLINIC_OR_DEPARTMENT_OTHER): Payer: Medicare Other | Admitting: General Surgery

## 2022-10-28 ENCOUNTER — Encounter (HOSPITAL_BASED_OUTPATIENT_CLINIC_OR_DEPARTMENT_OTHER): Payer: Medicare Other | Attending: General Surgery | Admitting: General Surgery

## 2022-10-28 DIAGNOSIS — L0231 Cutaneous abscess of buttock: Secondary | ICD-10-CM | POA: Insufficient documentation

## 2022-10-28 DIAGNOSIS — Z7952 Long term (current) use of systemic steroids: Secondary | ICD-10-CM | POA: Insufficient documentation

## 2022-10-28 DIAGNOSIS — L89523 Pressure ulcer of left ankle, stage 3: Secondary | ICD-10-CM | POA: Insufficient documentation

## 2022-10-28 DIAGNOSIS — E2740 Unspecified adrenocortical insufficiency: Secondary | ICD-10-CM | POA: Insufficient documentation

## 2022-10-28 DIAGNOSIS — L98415 Non-pressure chronic ulcer of buttock with muscle involvement without evidence of necrosis: Secondary | ICD-10-CM | POA: Insufficient documentation

## 2022-10-28 DIAGNOSIS — L89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, stage 3: Secondary | ICD-10-CM | POA: Diagnosis present

## 2022-10-28 DIAGNOSIS — I10 Essential (primary) hypertension: Secondary | ICD-10-CM | POA: Insufficient documentation

## 2022-10-28 NOTE — Progress Notes (Signed)
Andrea Yu, Andrea Yu Yu ([id number]) [id number] Page 1 of 13 Visit Report for 10/28/2022 Chief Complaint Document Details Patient Name: Date of Service: Andrea Yu 10/28/2022 2:15 PM Medical Record Number: [id number] Patient Account Number: [id number] Date of Birth/Sex: Treating RN: 1940-10-02 (82 y.o. F) Primary Care Provider: Jarome Yu Other Clinician: Referring Provider: Treating Provider/Extender: Andrea Yu in Treatment: 48 Information Obtained from: Patient Chief Complaint Patient is at the clinic for treatment of an open pressure ulcer on her right medial ankle, and Yu large abscess on her right buttock Electronic Signature(s) Signed: 10/28/2022 2:43:26 PM By: Duanne Guess MD FACS Entered By: Duanne Yu on 10/28/2022 11:43:26 -------------------------------------------------------------------------------- Debridement Details Patient Name: Date of Service: Andrea Yu 10/28/2022 2:15 PM Medical Record Number: [id number] Patient Account Number: [id number] Date of Birth/Sex: Treating RN: April 15, 1940 (81 y.o. Andrea Yu Primary Care Provider: Jarome Yu Other Clinician: Referring Provider: Treating Provider/Extender: Andrea Yu in Treatment: 48 Debridement Performed for Assessment: Wound #1 Right,Medial Malleolus Performed By: Physician Duanne Guess, MD The following information was scribed by: Andrea Yu The information was scribed for: Duanne Yu Debridement Type: Debridement Level of Consciousness (Pre-procedure): Awake and Alert Pre-procedure Verification/Time Out Yes - 14:28 Taken: Start Time: 14:28 Pain Control: Lidocaine 4% T opical Solution Percent of Wound Bed Debrided: 100% T Area Debrided (cm): otal 0.35 Tissue and other material debrided: Non-Viable, Slough, Slough Level: Non-Viable Tissue Debridement Description:  Selective/Open Wound Instrument: Curette Bleeding: Minimum Hemostasis Achieved: Pressure Response to Treatment: Procedure was tolerated well Level of Consciousness (Post- Awake and Alert procedure): Post Debridement Measurements of Total Wound Length: (cm) 0.9 Stage: Category/Stage III Width: (cm) 0.5 Depth: (cm) 0.1 Volume: (cm) 0.035 Character of Wound/Ulcer Post Debridement: Improved Post Procedure Diagnosis Andrea Yu, Andrea Yu ([id number]) [id number] Page 2 of 13 Same as Pre-procedure Electronic Signature(s) Signed: 10/28/2022 3:18:17 PM By: Duanne Guess MD FACS Signed: 10/28/2022 3:36:57 PM By: Andrea Yu By: Andrea Yu on 10/28/2022 11:30:26 -------------------------------------------------------------------------------- Debridement Details Patient Name: Date of Service: Andrea Yu 10/28/2022 2:15 PM Medical Record Number: [id number] Patient Account Number: [id number] Date of Birth/Sex: Treating RN: [birth year] (81 y.o. Andrea Yu Primary Care Provider: Jarome Yu Other Clinician: Referring Provider: Treating Provider/Extender: Andrea Yu in Treatment: 48 Debridement Performed for Assessment: Wound #4 Left,Lateral Ankle Performed By: Physician Duanne Guess, MD The following information was scribed by: Andrea Yu The information was scribed for: Duanne Yu Debridement Type: Debridement Level of Consciousness (Pre-procedure): Awake and Alert Pre-procedure Verification/Time Out Yes - 14:28 Taken: Start Time: 14:28 Pain Control: Lidocaine 4% T opical Solution Percent of Wound Bed Debrided: 100% T Area Debrided (cm): otal 0.31 Tissue and other material debrided: Non-Viable, Slough, Slough Level: Non-Viable Tissue Debridement Description: Selective/Open Wound Instrument: Curette Bleeding: Minimum Hemostasis Achieved: Pressure Response to Treatment:  Procedure was tolerated well Level of Consciousness (Post- Awake and Alert procedure): Post Debridement Measurements of Total Wound Length: (cm) 0.8 Stage: Category/Stage IV Width: (cm) 0.5 Depth: (cm) 0.2 Volume: (cm) 0.063 Character of Wound/Ulcer Post Debridement: Improved Post Procedure Diagnosis Same as Pre-procedure Electronic Signature(s) Signed: 10/28/2022 3:18:17 PM By: Duanne Guess MD FACS Signed: 10/28/2022 3:36:57 PM By: Andrea Yu Entered By: Andrea Yu on 10/28/2022 11:31:00 -------------------------------------------------------------------------------- HPI Details Patient Name: Date of Service: Andrea Yu 10/28/2022 2:15 PM Medical Record Number: [id number] Patient Account Number: [id number] Date of Birth/Sex: Treating RN: [birth year] (81 y.o. 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 She is not diabetic and quit smoking over 40 years ago. 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 She has developed Yu pressure ulcer on the right medial malleolus. In addition, she apparently has had multiple cutaneous abscesses on her buttocks that have required repeated incision and drainage procedures. She has developed Yu large abscess on her right buttock that has become painful. She was recently treated with cephalexin by her PCP for urinary tract infection and also received Yu shot of Rocephin for the abscess, but it has not yet been incised or drained. She is nonambulatory but is able to stand to transfer. She does not  wear any sort of Prevalon boot. ABI in clinic today was 0.96. On her right medial malleolus, there is Yu circular ulcer. The surface is dry and fibrotic. There is some periwound erythema, but no malodor or purulent drainage. On her right buttock, there is Yu large fluctuant bulge about the size of an orange. It is also erythematous and tender. 12/04/2021: The culture from her abscess grew out staph. Yu 10-day course of Bactrim was prescribed and she is currently taking this. Unfortunately, Dakin's solution was not delivered and only 2 x 2 gauze was sent, rather than Kerlix so the packing of the wound has been rather suboptimal. The wound cavity has light slough over all of the surfaces. Although covered, bone is palpable. The medial ankle wound looks about the same with Yu layer of slough accumulation. In addition, her daughter peeled some dry skin off of her foot this morning and she now has denuded areas over the majority of the plantar surface of her right foot. 01/01/2022: The patient has missed several visits and to her daughters report that it is somewhat difficult to get her here on Yu weekly basis. In the interim, she has developed Yu new pressure ulcer on her left lateral malleolus. The plantar surface of her right foot has healed. The cavity on her right buttock has contracted, but it remains quite deep and the trochanter, while not exposed, is palpable at the base. The pressure ulcer on her right medial malleolus is basically unchanged. 02/09/2022: All wounds are little bit smaller today. As the buttock abscess cavity has contracted, it has left some tunneling that has not been adequately packed. Light slough accumulation on both ankle wounds. 02/26/2022: All of the wounds continue to contract. There is slough on both ankle wounds. The buttock ulcer is clean. She brought her wound VAC with her today. 03/12/2022: The ankle wounds are about the same this week with slough accumulation. The buttock ulcer  is smaller and quite clean. She decided that she could not tolerate the discomfort of the wound VAC and removed it. They have been packing the wound with Dakin's moistened gauze. 03/25/2022: The ankle wounds are slightly smaller. Both have slough accumulation. The orifice of the buttock ulcer has gotten quite Yu bit smaller than the underlying cavity. She has decided that she would like to have the wound VAC back. 3/14; patient presents for follow-up. She has bilateral ankle wounds and has been using Hydrofera Blue to the these wound beds. She has Prevalon boots to help with offloading.  She also has Yu sacral wound that we have been using Yu wound VAC for. She has no issues or complaints today. 04/29/2022: She once again decided she did not like the wound VAC and so they have been packing her gluteal wound with Dakin's moistened gauze. The gluteal ulcer seems to be about the same. Her left lateral ankle wound has gotten deeper and larger. The surface tissue is gray with thick slough. No significant change to the right medial ankle wound. 05/18/2022: The depth on the gluteal ulcer has come in Yu bit. There was Yu very strong pungent odor when the packing was removed, however. The right medial ankle wound is about the same size, but the tissue surface looks healthier. The left lateral ankle wound has reaccumulated the rubbery gray slough. 05/31/2022: The gluteal ulcer is shallower and no longer has any dips or crevices. The odor has abated completely. The right medial ankle wound is unchanged. The left lateral ankle wound looks quite Yu bit healthier with Yu cleaner surface and the beginnings of granulation tissue emergence. 06/16/2022: The gluteal ulcer has the same measurements more or less, but overall the volume seems smaller, as the cavity feels tighter on examination. The ankle wounds are basically the same. 07/12/2022: The cavity of the buttock ulcer continues to contract. The left ankle wound has Yu little bit  more granulation tissue, but bone remains exposed. The right ankle wound is flush with the surrounding skin. 07/21/2022: There is 1 deeper area in the buttock ulcer that I have not appreciated previously. I can feel bone under Yu layer of tissue. The rest of the cavity has contracted. Both ankle wounds are filling in with better granulation tissue. There is slough on both ankle wound surfaces. She struck her leg on her bed frame and has 2 new wounds on her right anterior tibial surface. Both have fat layer exposure but no concern for infection. 08/02/2022: I do not feel bone as easily in the buttock ulcer. There is slough on the surface. The cavity seems to be contracting. Both ankle wounds have improved tissue quality and there is granulation tissue nearly completely covering the bone on the left lateral malleolus. The wounds on her right anterior tibial surface have dried up. 08/10/2022: The cavity at the buttock ulcer has contracted further. The depth remains about the same. The surface is cleaner this week. Both ankle wounds are little bit smaller today. They have rubbery slough on the surface which appears to be leftover Oasis. Underneath, the quality of the tissue continues to improve and there is no longer any bone exposed on the left lateral malleolus. 08/16/2022: The buttock ulcer cavity has contracted further and she has less tenderness at the deepest aspect of the wound. Both ankle wounds look smaller today, although they did not measure any differently. Minimal slough accumulation with fairly healthy-looking granulation tissue present. 08/24/2022: The buttock ulcer cavity is smaller again today. She no longer has the tenderness at the deepest aspect of the wound. Both ankle wounds measured about Yu millimeter smaller. There is healthy-looking granulation tissue present bilaterally with minimal slough on the left and no slough seen on the right. 08/31/2022: The cavity of the buttock ulcer continues  to contract. It is about half Yu centimeter shallower than last week and there is no undermining. The cavity is also narrower and is more difficult to get an index finger into the wound. Both ankle wounds measured slightly smaller. Both have Yu rather dry skin edge but good  granulation tissue present with minimal slough. 09/08/2022: The buttock ulcer cavity is smaller again today. It is shallower and narrower. Both ankle wounds were Yu little bit smaller, as well. There is Yu fair amount of slough on the left lateral ankle wound. 09/14/2022: Unfortunately, the buttock ulcer cavity measured Yu little larger and Yu little deeper today. The patient did say she has been sitting Yu bit more of late. The right medial ankle wound is about the same size; the left lateral ankle wound is Yu little smaller and shallower. There is an area on the medial aspect of her right calcaneus that looks concerning for potential pressure-induced deep tissue injury. 09/29/2022: Both ankle wounds measured smaller today. Underneath the dried-on Oasis and slough, there is actually fairly healthy-looking granulation tissue filling in. The gluteal ulcer is narrower and tighter, but the depth has not really decreased at all. Andrea Yu, Andrea Yu ([id number]) [id number] Page 4 of 13 10/05/2022: Once again, both ankle wounds measured slightly smaller. The tissue surfaces look healthy. The gluteal ulcer continues to contract circumferentially, but the depth remains stable. 10/12/2022: The depth on the gluteal ulcer came in by about half Yu centimeter this week. Both ankles look about the same with rubbery slough overlying granulation tissue. The granulation tissue is not quite as pink and robust-appearing as on previous occasions. 10/28/2022: The gluteal ulcer is shallower again this week without any tunneling. It continues to contract in all dimensions and the surface is quite clean. Both ankle wounds are little bit smaller  today. They have slough on the surfaces, but the underlying granulation tissue is healthier-looking. Electronic Signature(s) Signed: 10/28/2022 2:51:34 PM By: Duanne Guess MD FACS Previous Signature: 10/28/2022 2:44:42 PM Version By: Duanne Guess MD FACS Entered By: Duanne Yu on 10/28/2022 11:51:34 -------------------------------------------------------------------------------- Physical Exam Details Patient Name: Date of Service: Andrea Yu 10/28/2022 2:15 PM Medical Record Number: [id number] Patient Account Number: [id number] Date of Birth/Sex: Treating RN: [birth year] (81 y.o. F) Primary Care Provider: Jarome Yu Other Clinician: Referring Provider: Treating Provider/Extender: Andrea Yu in Treatment: 48 Constitutional . . . . no acute distress. Respiratory Normal work of breathing on room air. Notes 10/28/2022: The gluteal ulcer is shallower again this week without any tunneling. It continues to contract in all dimensions and the surface is quite clean. Both ankle wounds are little bit smaller today. They have slough on the surfaces, but the underlying granulation tissue is healthier-looking. Electronic Signature(s) Signed: 10/28/2022 2:51:53 PM By: Duanne Guess MD FACS Previous Signature: 10/28/2022 2:50:02 PM Version By: Duanne Guess MD FACS Entered By: Duanne Yu on 10/28/2022 11:51:52 -------------------------------------------------------------------------------- Physician Orders Details Patient Name: Date of Service: Andrea Yu 10/28/2022 2:15 PM Medical Record Number: [id number] Patient Account Number: [id number] Date of Birth/Sex: Treating RN: 13-Oct-1940 (81 y.o. 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 - Dr. Lady Gary rm  1 Return Yu Anesthetic (In clinic) Topical Lidocaine 4% applied to wound bed Bathing/ Shower/ Hygiene May shower and wash wound with soap and water. - with dressing changes Off-Loading Multipodus Splint to: - prevalon boot to both feet Turn and reposition every 2 hours Home Health New wound care orders this week; continue Home Health for wound care. May utilize formulary equivalent dressing for wound treatment orders unless otherwise specified. Dressing changes to be completed by Home Health on Monday / Wednesday / Friday except when patient has scheduled visit at [facility]. Other Home Health Orders/Instructions: - [facility] Wound Treatment Wound #1 - Malleolus Wound Laterality: Right, Medial Prim Dressing: Promogran Prisma Matrix, 4.34 (sq in) (silver collagen) 3 x Per Week/30 Days ary Discharge Instructions: Moisten collagen with saline or hydrogel Secondary Dressing: Woven Gauze Sponge, Non-Sterile 4x4 in 3 x Per Week/30 Days Discharge Instructions: Apply over primary dressing as directed. Secured With: [employer], 4.5x3.1 (in/yd) 3 x Per Week/30 Days Discharge Instructions: Secure with Kerlix as directed. Wound #2 - Gluteus Wound Laterality: Right Prim Dressing: Dakin's Solution 0.25%, 16 (oz) 1 x Per Day/30 Days ary Discharge  Instructions: Moisten gauze with Dakin's solution Prim Dressing: Promogran Prisma Matrix, 4.34 (sq in) (silver collagen) 1 x Per Day/30 Days ary Discharge Instructions: Moisten collagen with saline or hydrogel Secondary Dressing: Zetuvit Plus Silicone Border Heel Dressing10x10 (in/in) 1 x Per Day/30 Days Discharge Instructions: Apply silicone border over primary dressing as directed. Wound #4 - Ankle Wound Laterality: Left, Lateral Prim Dressing: Promogran Prisma Matrix, 4.34 (sq in) (silver collagen) 3 x Per Week/30 Days ary Discharge Instructions: Moisten collagen with saline or hydrogel Secondary Dressing: Woven Gauze Sponge, Non-Sterile 4x4 in 3 x Per Week/30 Days Discharge Instructions: Apply over primary dressing as directed. Secured With: [employer], 4.5x3.1 (in/yd) 3 x Per Week/30 Days Discharge Instructions: Secure with Kerlix as directed. Patient Medications llergies: penicillin, latex, Prolia, ceftriaxone, ciprofloxacin, Fosamax, lisinopril, tuberculin,PPD,multi-puncture Yu Notifications Medication Indication Start End 10/28/2022 lidocaine DOSE topical 4 % cream - cream topical Electronic Signature(s) Signed: 10/28/2022 3:18:17 PM By: Duanne Guess MD FACS Entered By: Duanne Yu on 10/28/2022 11:52:20 Andrea Yu ([id number]) [id number] Page 6 of 13 -------------------------------------------------------------------------------- Problem List Details Patient Name: Date of Service: Andrea Yu 10/28/2022 2:15 PM Medical Record Number: [id number] Patient Account Number: [id number] Date of Birth/Sex: Treating RN: 06/22/1940 (82 y.o. 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 without 01/01/2022 No Yes evidence of necrosis L02.31 Cutaneous abscess of buttock 11/24/2021 No Yes E27.40 Unspecified adrenocortical insufficiency 11/24/2021 No Yes Z79.52 Long term (current) use of systemic steroids 11/24/2021 No Yes I10 Essential (primary) hypertension 11/24/2021 No Yes Inactive Problems ICD-10 Code Description Active Date Inactive Date L97.812 Non-pressure chronic ulcer of other part of right lower leg with fat layer exposed 07/21/2022 07/21/2022 Resolved Problems ICD-10 Code Description Active Date Resolved Date L97.511 Non-pressure chronic ulcer of other part of right foot limited to breakdown of skin 12/04/2021 12/04/2021 Electronic Signature(s) Signed: 10/28/2022 2:40:49 PM By: Duanne Guess MD FACS Entered By: Duanne Yu on 10/28/2022 11:40:49 Andrea Yu ([id number]) [id number] Page 7 of 13 -------------------------------------------------------------------------------- Progress Note Details Patient Name: Date of Service:  Andrea Yu 10/28/2022 2:15 PM Medical Record Number: [id number] Patient Account Number: [id number] Date of Birth/Sex: Treating RN: [birth year] (82 y.o. 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 She is not diabetic and quit smoking over 40 years ago. 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 As result, she has an ankle deformity. She has developed Yu pressure ulcer on the right medial malleolus. In addition, she apparently has had multiple cutaneous abscesses on her buttocks that have required repeated incision and drainage procedures. She has developed Yu large abscess on her right buttock that has become painful. She was recently treated with cephalexin by her PCP for urinary tract infection and also received Yu shot of Rocephin for the abscess, but it has not yet been incised or drained. She is nonambulatory but is able to stand to transfer. She does not wear any sort of Prevalon boot. ABI in clinic today was 0.96. On her right medial malleolus, there is Yu circular ulcer. The surface is dry and fibrotic. There is some periwound erythema, but no malodor or purulent drainage. On her right buttock, there is Yu large fluctuant bulge about the size of an orange. It is also erythematous and tender. 12/04/2021: The culture from her abscess grew out staph. Yu 10-day course of Bactrim was prescribed and she is currently taking this. Unfortunately, Dakin's solution was not delivered and only 2 x 2 gauze was sent, rather than Kerlix so the packing of the wound has been rather suboptimal. The wound cavity has light slough over all of the surfaces. Although covered, bone is palpable. The medial ankle wound looks about the same with Yu layer of slough accumulation. In addition, her daughter peeled some dry skin off of her foot this morning and she now has denuded areas over the majority of the plantar surface of her right foot. 01/01/2022: The patient has missed several visits and to her daughters report that it is somewhat difficult to get her here on Yu weekly basis. In the interim, she has developed Yu new pressure ulcer on her left lateral malleolus. The plantar surface of her right foot has healed. The cavity on her right buttock has contracted, but it  remains quite deep and the trochanter, while not exposed, is palpable at the base. The pressure ulcer on her right medial malleolus is basically unchanged. 02/09/2022: All wounds are little bit smaller today. As the buttock abscess cavity has contracted, it has left some tunneling that has not been adequately packed. Light slough accumulation on both ankle wounds. 02/26/2022: All of the wounds continue to contract. There is slough on both ankle wounds. The buttock ulcer is clean. She brought her wound VAC with her today. 03/12/2022: The ankle wounds are about the same this week with slough accumulation. The buttock ulcer is smaller and quite clean. She decided that she could not tolerate the discomfort of the wound VAC and removed it. They have been packing the wound with Dakin's moistened gauze. 03/25/2022: The ankle wounds are slightly smaller. Both have slough accumulation. The orifice of the  buttock ulcer has gotten quite Yu bit smaller than the underlying cavity. She has decided that she would like to have the wound VAC back. 3/14; patient presents for follow-up. She has bilateral ankle wounds and has been using Hydrofera Blue to the these wound beds. She has Prevalon boots to help with offloading. She also has Yu sacral wound that we have been using Yu wound VAC for. She has no issues or complaints today. 04/29/2022: She once again decided she did not like the wound VAC and so they have been packing her gluteal wound with Dakin's moistened gauze. The gluteal ulcer seems to be about the same. Her left lateral ankle wound has gotten deeper and larger. The surface tissue is gray with thick slough. No significant change to the right medial ankle wound. 05/18/2022: The depth on the gluteal ulcer has come in Yu bit. There was Yu very strong pungent odor when the packing was removed, however. The right medial ankle wound is about the same size, but the tissue surface looks healthier. The left lateral ankle wound  has reaccumulated the rubbery gray slough. 05/31/2022: The gluteal ulcer is shallower and no longer has any dips or crevices. The odor has abated completely. The right medial ankle wound is unchanged. The left lateral ankle wound looks quite Yu bit healthier with Yu cleaner surface and the beginnings of granulation tissue emergence. 06/16/2022: The gluteal ulcer has the same measurements more or less, but overall the volume seems smaller, as the cavity feels tighter on examination. The ankle wounds are basically the same. 07/12/2022: The cavity of the buttock ulcer continues to contract. The left ankle wound has Yu little bit more granulation tissue, but bone remains exposed. The right ankle wound is flush with the surrounding skin. 07/21/2022: There is 1 deeper area in the buttock ulcer that I have not appreciated previously. I can feel bone under Yu layer of tissue. The rest of the cavity has contracted. Both ankle wounds are filling in with better granulation tissue. There is slough on both ankle wound surfaces. She struck her leg on her bed frame and has 2 new wounds on her right anterior tibial surface. Both have fat layer exposure but no concern for infection. 08/02/2022: I do not feel bone as easily in the buttock ulcer. There is slough on the surface. The cavity seems to be contracting. Both ankle wounds have improved tissue quality and there is granulation tissue nearly completely covering the bone on the left lateral malleolus. The wounds on her right anterior tibial surface have dried up. 08/10/2022: The cavity at the buttock ulcer has contracted further. The depth remains about the same. The surface is cleaner this week. Both ankle wounds are little bit smaller today. They have rubbery slough on the surface which appears to be leftover Oasis. Underneath, the quality of the tissue continues to improve and there is no longer any bone exposed on the left lateral malleolus. Andrea Yu, Andrea Yu ([id number])  [id number] Page 8 of 13 08/16/2022: The buttock ulcer cavity has contracted further and she has less tenderness at the deepest aspect of the wound. Both ankle wounds look smaller today, although they did not measure any differently. Minimal slough accumulation with fairly healthy-looking granulation tissue present. 08/24/2022: The buttock ulcer cavity is smaller again today. She no longer has the tenderness at the deepest aspect of the wound. Both ankle wounds measured about Yu millimeter smaller. There is healthy-looking granulation tissue present bilaterally with minimal slough on the left and  no slough seen on the right. 08/31/2022: The cavity of the buttock ulcer continues to contract. It is about half Yu centimeter shallower than last week and there is no undermining. The cavity is also narrower and is more difficult to get an index finger into the wound. Both ankle wounds measured slightly smaller. Both have Yu rather dry skin edge but good granulation tissue present with minimal slough. 09/08/2022: The buttock ulcer cavity is smaller again today. It is shallower and narrower. Both ankle wounds were Yu little bit smaller, as well. There is Yu fair amount of slough on the left lateral ankle wound. 09/14/2022: Unfortunately, the buttock ulcer cavity measured Yu little larger and Yu little deeper today. The patient did say she has been sitting Yu bit more of late. The right medial ankle wound is about the same size; the left lateral ankle wound is Yu little smaller and shallower. There is an area on the medial aspect of her right calcaneus that looks concerning for potential pressure-induced deep tissue injury. 09/29/2022: Both ankle wounds measured smaller today. Underneath the dried-on Oasis and slough, there is actually fairly healthy-looking granulation tissue filling in. The gluteal ulcer is narrower and tighter, but the depth has not really decreased at all. 10/05/2022: Once again,  both ankle wounds measured slightly smaller. The tissue surfaces look healthy. The gluteal ulcer continues to contract circumferentially, but the depth remains stable. 10/12/2022: The depth on the gluteal ulcer came in by about half Yu centimeter this week. Both ankles look about the same with rubbery slough overlying granulation tissue. The granulation tissue is not quite as pink and robust-appearing as on previous occasions. 10/28/2022: The gluteal ulcer is shallower again this week without any tunneling. It continues to contract in all dimensions and the surface is quite clean. Both ankle wounds are little bit smaller today. They have slough on the surfaces, but the underlying granulation tissue is healthier-looking. Patient History Information obtained from Patient, Caregiver, Chart. Family History Cancer - Father,Maternal Grandparents,Siblings,Child, Heart Disease - Father, Hypertension - Father, Thyroid Problems - Mother, No family history of Diabetes, Hereditary Spherocytosis, Kidney Disease, Lung Disease, Seizures, Stroke, Tuberculosis. Social History Former smoker - quit 40 yr ago, Marital Status - Widowed, Alcohol Use - Never, Drug Use - No History, Caffeine Use - Daily - tea, soda. Medical History Eyes Patient has history of Cataracts - bil extracted Denies history of Glaucoma, Optic Neuritis Cardiovascular Patient has history of Arrhythmia - afib, Hypertension Endocrine Denies history of Type I Diabetes, Type II Diabetes Genitourinary Denies history of End Stage Renal Disease Integumentary (Skin) Denies history of History of Burn Musculoskeletal Patient has history of Osteoarthritis, Osteomyelitis - pelvic Neurologic Patient has history of Neuropathy Oncologic Denies history of Received Chemotherapy, Received Radiation Psychiatric Patient has history of Confinement Anxiety Denies history of Anorexia/bulimia Hospitalization/Surgery History - TEE. - right ankle fusion. - bil  knee replacements. - bil cataract extractions. - posterior lumbar fusion. - vaginal hysterectomy. - cholecystectomy. Medical Yu Surgical History Notes nd Constitutional Symptoms (General Health) morbid obesity Ear/Nose/Mouth/Throat hard of hearing Gastrointestinal GERD Endocrine adrenal insufficiency Musculoskeletal psoriatic arthritis, thoracic discitis, displaced spiral fx right tibia Neurologic hx SAH Objective Constitutional Andrea Yu, Andrea Yu ([id number]) [id number] Page 9 of 13 no acute distress. Vitals Time Taken: 2:14 PM, Height: 59 in, Weight: 155 lbs, BMI: 31.3, Temperature: 97.6 F, Pulse: 70 bpm, Respiratory Rate: 16 breaths/min, Blood Pressure: 131/66 mmHg. Respiratory Normal work of breathing on room air. General Notes: 10/28/2022: The gluteal ulcer is shallower  again this week without any tunneling. It continues to contract in all dimensions and the surface is quite clean. Both ankle wounds are little bit smaller today. They have slough on the surfaces, but the underlying granulation tissue is healthier-looking. Integumentary (Hair, Skin) Wound #1 status is Open. Original cause of wound was Pressure Injury. The date acquired was: 11/16/2021. The wound has been in treatment 48 weeks. The wound is located on the Right,Medial Malleolus. The wound measures 0.9cm length x 0.5cm width x 0.1cm depth; 0.353cm^2 area and 0.035cm^3 volume. There is Fat Layer (Subcutaneous Tissue) exposed. There is no tunneling or undermining noted. There is Yu medium amount of serosanguineous drainage noted. The wound margin is distinct with the outline attached to the wound base. There is large (67-100%) red, pink granulation within the wound bed. There is Yu small (1- 33%) amount of necrotic tissue within the wound bed including Adherent Slough. The periwound skin appearance had no abnormalities noted for texture. The periwound skin appearance exhibited: Maceration, Erythema.  The surrounding wound skin color is noted with erythema. Erythema is measured at 1.5 cm. Periwound temperature was noted as No Abnormality. Wound #2 status is Open. Original cause of wound was Bump. The date acquired was: 09/28/2021. The wound has been in treatment 48 weeks. The wound is located on the Right Gluteus. The wound measures 0.3cm length x 1cm width x 1.8cm depth; 0.236cm^2 area and 0.424cm^3 volume. There is Fat Layer (Subcutaneous Tissue) exposed. There is no tunneling or undermining noted. There is Yu medium amount of serous drainage noted. The wound margin is epibole. There is large (67-100%) red granulation within the wound bed. There is Yu small (1-33%) amount of necrotic tissue within the wound bed including Adherent Slough. The periwound skin appearance had no abnormalities noted for texture. The periwound skin appearance had no abnormalities noted for moisture. The periwound skin appearance had no abnormalities noted for color. Periwound temperature was noted as No Abnormality. The periwound has tenderness on palpation. Wound #4 status is Open. Original cause of wound was Pressure Injury. The date acquired was: 12/23/2021. The wound has been in treatment 42 weeks. The wound is located on the Left,Lateral Ankle. The wound measures 0.8cm length x 0.5cm width x 0.2cm depth; 0.314cm^2 area and 0.063cm^3 volume. There is Fat Layer (Subcutaneous Tissue) exposed. There is no tunneling or undermining noted. There is Yu medium amount of serous drainage noted. The wound margin is distinct with the outline attached to the wound base. There is small (1-33%) pink granulation within the wound bed. There is Yu large (67-100%) amount of necrotic tissue within the wound bed including Adherent Slough. The periwound skin appearance had no abnormalities noted for texture. The periwound skin appearance exhibited: Maceration, Erythema. The periwound skin appearance did not exhibit: Rubor. The surrounding wound  skin color is noted with erythema which is circumferential. Periwound temperature was noted as No Abnormality. The periwound has tenderness on palpation. 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 There was Yu Selective/Open Wound Non-Viable Tissue Debridement with Yu total area of 0.35 sq cm performed by Duanne Guess, MD. With the following instrument(s): Curette to remove Non-Viable tissue/material. Material removed  includes Slough after achieving pain control using Lidocaine 4% Topical Solution. No specimens were taken. Yu time out was conducted at 14:28, prior to the start of the procedure. Yu Minimum amount of bleeding was controlled with Pressure. The procedure was tolerated well. Post Debridement Measurements: 0.9cm length x 0.5cm width x 0.1cm depth; 0.035cm^3 volume. Post debridement Stage noted as Category/Stage III. Character of Wound/Ulcer Post Debridement is improved. Post procedure Diagnosis Wound #1: Same as Pre-Procedure Wound #4 Pre-procedure diagnosis of Wound #4 is Yu Pressure Ulcer located on the Left,Lateral Ankle . There was Yu Selective/Open Wound Non-Viable Tissue Debridement with Yu total area of 0.31 sq cm performed by Duanne Guess, MD. With the following instrument(s): Curette to remove Non-Viable tissue/material. Material removed includes [facility] after achieving pain control using Lidocaine 4% Topical Solution. No specimens were taken. Yu time out was conducted at 14:28, prior to the start of the procedure. Yu Minimum amount of bleeding was controlled  with Pressure. The procedure was tolerated well. Post Debridement Measurements: 0.8cm length x 0.5cm width x 0.2cm depth; 0.063cm^3 volume. Post debridement Stage noted as Category/Stage IV. Character of Wound/Ulcer Post Debridement is improved. Post procedure Diagnosis Wound #4: Same as Pre-Procedure Plan Follow-up Appointments: TAQUILA, LEYS ([id number]) [phone].pdf Page 10 of 13 Return Appointment in 2 weeks. - Dr. Lady Gary rm 1 Anesthetic: (In clinic) Topical Lidocaine 4% applied to wound bed Bathing/ Shower/ Hygiene: May shower and wash wound with soap and water. - with dressing changes Off-Loading: Multipodus Splint to: - prevalon boot to both feet Turn and reposition every 2 hours Home Health: New wound care orders this week; continue Home Health for wound care. May utilize formulary equivalent dressing for wound treatment orders unless otherwise specified. Dressing changes to be completed by Home Health on Monday / Wednesday / Friday except when patient has scheduled visit at [facility]. Other Home Health Orders/Instructions: - [facility] The following medication(s) was prescribed: lidocaine topical 4 % cream cream topical was prescribed at facility WOUND #1: - Malleolus Wound Laterality: Right, Medial Prim Dressing: Promogran Prisma Matrix, 4.34 (sq in) (silver collagen) 3 x Per Week/30 Days ary Discharge Instructions: Moisten collagen with saline or hydrogel Secondary Dressing: Woven Gauze Sponge, Non-Sterile 4x4 in 3 x Per Week/30 Days Discharge Instructions: Apply over primary dressing as directed. Secured With: [employer], 4.5x3.1 (in/yd) 3 x Per Week/30 Days Discharge Instructions: Secure with Kerlix as directed. WOUND #2: - Gluteus Wound Laterality: Right Prim Dressing: Dakin's Solution 0.25%, 16 (oz) 1 x Per Day/30 Days ary Discharge Instructions: Moisten gauze with Dakin's solution Prim Dressing: Promogran Prisma Matrix, 4.34  (sq in) (silver collagen) 1 x Per Day/30 Days ary Discharge Instructions: Moisten collagen with saline or hydrogel Secondary Dressing: Zetuvit Plus Silicone Border Heel Dressing10x10 (in/in) 1 x Per Day/30 Days Discharge Instructions: Apply silicone border over primary dressing as directed. WOUND #4: - Ankle Wound Laterality: Left, Lateral Prim Dressing: Promogran Prisma Matrix, 4.34 (sq in) (silver collagen) 3 x Per Week/30 Days ary Discharge Instructions: Moisten collagen with saline or hydrogel Secondary Dressing: Woven Gauze Sponge, Non-Sterile 4x4 in 3 x Per Week/30 Days Discharge Instructions: Apply over primary dressing as directed. Secured With: [employer], 4.5x3.1 (in/yd) 3 x Per Week/30 Days Discharge Instructions: Secure with Kerlix as directed. 10/28/2022: The gluteal ulcer is shallower again this week without any tunneling. It continues to contract in all dimensions and the surface is quite clean. Both ankle wounds are little bit smaller today. They have slough on the  surfaces, but the underlying granulation tissue is healthier-looking. The gluteal wound did not require any debridement. We will continue to apply Prisma silver collagen at the base and pack the cavity with Dakin's-moistened gauze. I debrided slough off of both ankle wounds. Continue with Prisma silver collagen to each ankle. Follow-up in 2 weeks. Electronic Signature(s) Signed: 10/28/2022 3:01:22 PM By: Duanne Guess MD FACS Previous Signature: 10/28/2022 2:52:36 PM Version By: Duanne Guess MD FACS Previous Signature: 10/28/2022 2:51:07 PM Version By: Duanne Guess MD FACS Entered By: Duanne Yu on 10/28/2022 12:01:21 -------------------------------------------------------------------------------- HxROS Details Patient Name: Date of Service: Andrea Yu 10/28/2022 2:15 PM Medical Record Number: [id number] Patient Account Number: [id number] Date of Birth/Sex: Treating RN: 10-Feb-1940 (81  y.o. F) Primary Care Provider: Jarome Yu Other Clinician: Referring Provider: Treating Provider/Extender: Andrea Yu in Treatment: 48 Information Obtained From Patient Caregiver Chart Constitutional Symptoms (General Health) Medical History: Past Medical History Notes: morbid obesity Eyes Medical HistoryJASIRA, Andrea Yu ([id number]) [phone].pdf Page 11 of 13 Positive for: Cataracts - bil extracted Negative for: Glaucoma; Optic Neuritis Ear/Nose/Mouth/Throat Medical History: Past Medical History Notes: hard of hearing Cardiovascular Medical History: Positive for: Arrhythmia - afib; Hypertension Gastrointestinal Medical History: Past Medical History Notes: GERD Endocrine Medical History: Negative for: Type I Diabetes; Type II Diabetes Past Medical History Notes: adrenal insufficiency Genitourinary Medical History: Negative for: End Stage Renal Disease Integumentary (Skin) Medical History: Negative for: History of Burn Musculoskeletal Medical History: Positive for: Osteoarthritis; Osteomyelitis - pelvic Past Medical History Notes: psoriatic arthritis, thoracic discitis, displaced spiral fx right tibia Neurologic Medical History: Positive for: Neuropathy Past Medical History Notes: hx [facility] Oncologic Medical History: Negative for: Received Chemotherapy; Received Radiation Psychiatric Medical History: Positive for: Confinement Anxiety Negative for: Anorexia/bulimia HBO Extended History Items Eyes: Cataracts Immunizations Pneumococcal Vaccine: Received Pneumococcal Vaccination: Yes Received Pneumococcal Vaccination On or After 60th Birthday: Yes Implantable Devices None Hospitalization / Surgery History Type of Hospitalization/Surgery TEE Andrea Yu, Andrea Yu ([id number]) [id number] Page 12 of 13 right ankle fusion bil knee replacements bil cataract  extractions posterior lumbar fusion vaginal hysterectomy cholecystectomy Family and Social History Cancer: Yes - Father,Maternal Grandparents,Siblings,Child; Diabetes: No; Heart Disease: Yes - Father; Hereditary Spherocytosis: No; Hypertension: Yes - Father; Kidney Disease: No; Lung Disease: No; Seizures: No; Stroke: No; Thyroid Problems: Yes - Mother; Tuberculosis: No; Former smoker - quit 40 yr ago; Marital Status - Widowed; Alcohol Use: Never; Drug Use: No History; Caffeine Use: Daily - tea, soda; Financial Concerns: No; Food, Clothing or Shelter Needs: No; Support System Lacking: No; Transportation Concerns: No Electronic Signature(s) Signed: 10/28/2022 3:18:17 PM By: Duanne Guess MD FACS Entered By: Duanne Yu on 10/28/2022 11:47:46 -------------------------------------------------------------------------------- SuperBill Details Patient Name: Date of Service: Andrea Yu 10/28/2022 Medical Record Number: [id number] Patient Account Number: [id number] Date of Birth/Sex: Treating RN: 1940/09/11 (82 y.o. 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 L02.31 Cutaneous abscess of buttock E27.40 Unspecified adrenocortical insufficiency Z79.52 Long term (current) use of systemic steroids I10 Essential (primary) hypertension Facility Procedures : CPT4 Code: [id number] Description: 97597 - DEBRIDE WOUND 1ST 20 SQ CM OR < ICD-10 Diagnosis Description 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, stage 3 L89.523 Pressure ulcer of left ankle, stage  3 Modifier: Quantity: 1 Physician Procedures : CPT4 Code Description Modifier  [id number] - WC PHYS LEVEL 4 - EST PT 25 ICD-10 Diagnosis Description 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, stage 3 L89.523 Pressure ulcer of left ankle, stage 3 L98.415 Non-pressure chronic ulcer of buttock with  muscle involvement without evidence of necrosis Z79.52 Long term (current) use of systemic steroids Quantity: 1 : [id number] 97597 - WC PHYS DEBR WO ANESTH 20 SQ CM ICD-10 Diagnosis Description 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, stage 3 L89.523 Pressure ulcer of left ankle, stage 3 Quantity: 1 Electronic Signature(s) Andrea Yu, PLOCH Yu ([id number]) [id number] Page 13 of 13 Signed: 10/28/2022 3:01:46 PM By: Duanne Guess MD FACS Entered By: Duanne Yu on 10/28/2022 12:01:45

## 2022-10-29 NOTE — Progress Notes (Signed)
[name], ALMGREN A ([id number]) [id number] Page 1 of 10 Visit Report for 10/28/2022 Arrival Information Details Patient Name: Date of Service: Andrea Yu 10/28/2022 2:15 PM Medical Record Number: [id number] Patient Account Number: [id number] Date of Birth/Sex: Treating RN: 10-12-1940 (82 y.o. Andrea Yu Primary Care Yanelie Abraha: Jarome Matin Other Clinician: Referring Dickie Labarre: Treating Linder Prajapati/Extender: Marena Chancy in Treatment: 48 Visit Information History Since Last Visit Added or deleted any medications: No Patient Arrived: Wheel Chair Any new allergies or adverse reactions: No Arrival Time: 14:03 Had a fall or experienced change in No Accompanied By: caregiver activities of daily living that may affect Transfer Assistance: Manual risk of falls: Patient Identification Verified: Yes Signs or symptoms of abuse/neglect since last visito No Secondary Verification Process Completed: Yes Hospitalized since last visit: No Patient Requires Transmission-Based Precautions: No Implantable device outside of the clinic excluding No Patient Has Alerts: No cellular tissue based products placed in the center since last visit: Has Dressing in Place as Prescribed: Yes Pain Present Now: No Electronic Signature(s) Signed: 10/28/2022 3:36:57 PM By: Samuella Bruin Entered By: Samuella Bruin on 10/28/2022 14:06:35 -------------------------------------------------------------------------------- Encounter Discharge Information Details Patient Name: Date of Service: Andrea Yu 10/28/2022 2:15 PM Medical Record Number: [id number] Patient Account Number: [id number] Date of Birth/Sex: Treating RN: [birth year] (81 y.o. Andrea Yu Primary Care Anija Brickner: Jarome Matin Other Clinician: Referring Alesi Zachery: Treating Willa Brocks/Extender: Marena Chancy in Treatment: 48 Encounter  Discharge Information Items Post Procedure Vitals Discharge Condition: Stable Temperature (F): 97.6 Ambulatory Status: Wheelchair Pulse (bpm): 76 Discharge Destination: Home Respiratory Rate (breaths/min): 16 Transportation: Private Auto Blood Pressure (mmHg): 131/66 Accompanied By: daughter Schedule Follow-up Appointment: Yes Clinical Summary of Care: Patient Declined Electronic Signature(s) Signed: 10/28/2022 3:36:57 PM By: Samuella Bruin Entered By: Samuella Bruin on 10/28/2022 14:49:48 [name] ([id number]) [id number] Page 2 of 10 -------------------------------------------------------------------------------- Lower Extremity Assessment Details Patient Name: Date of Service: Andrea Yu 10/28/2022 2:15 PM Medical Record Number: [id number] Patient Account Number: [id number] Date of Birth/Sex: Treating RN: [birth year] (82 y.o. Andrea Yu Primary Care Andrea Yu: Jarome Matin Other Clinician: Referring Toneshia Coello: Treating Jacqulyn Barresi/Extender: Marena Chancy in Treatment: 48 Edema Assessment Assessed: Kyra Searles: No] [Right: No] Edema: [Left: No] [Right: No] Calf Left: Right: Point of Measurement: From Medial Instep 28 cm 32 cm Ankle Left: Right: Point of Measurement: From Medial Instep 18.2 cm 22 cm Vascular Assessment Extremity colors, hair growth, and conditions: Extremity Color: [Left:Hyperpigmented] [Right:Hyperpigmented] Hair Growth on Extremity: [Left:No] [Right:No] Temperature of Extremity: [Left:Warm < 3 seconds] [Right:Warm < 3 seconds] Electronic Signature(s) Signed: 10/28/2022 3:36:57 PM By: Samuella Bruin Entered By: Samuella Bruin on 10/28/2022 14:06:56 -------------------------------------------------------------------------------- Multi Wound Chart Details Patient Name: Date of Service: Andrea Yu 10/28/2022 2:15 PM Medical Record Number: [id number] Patient Account  Number: [id number] Date of Birth/Sex: Treating RN: [birth year] (81 y.o. F) Primary Care Andrea Yu: Jarome Matin Other Clinician: Referring Deuce Paternoster: Treating Vikkie Goeden/Extender: Marena Chancy in Treatment: 48 Vital Signs Height(in): 59 Pulse(bpm): 70 Weight(lbs): 155 Blood Pressure(mmHg): 131/66 Body Mass Index(BMI): 31.3 Temperature(F): 97.6 Respiratory Rate(breaths/min): 16 [1:Photos:] [[id number] Page 3 of 10] Right, Medial Malleolus Right Gluteus Left, Lateral Ankle Wound Location: Pressure Injury Bump Pressure Injury Wounding Event: Pressure Ulcer Abscess Pressure Ulcer Primary Etiology: Cataracts, Arrhythmia, Hypertension,Cataracts, Arrhythmia, Hypertension, Cataracts, Arrhythmia, Hypertension, Comorbid History: Osteoarthritis, Osteomyelitis, Osteoarthritis, Osteomyelitis, Osteoarthritis, Osteomyelitis, Neuropathy, Confinement Anxiety Neuropathy, Confinement Anxiety Neuropathy, Confinement Anxiety 11/16/2021 09/28/2021 12/23/2021 Date Acquired: 48 48 42  Weeks of Treatment: Open Open Open Wound Status: No No No Wound Recurrence: 0.9x0.5x0.1 0.3x1x1.8 0.8x0.5x0.2 Measurements L x W x D (cm) 0.353 0.236 0.314 A (cm) : rea 0.035 0.424 0.063 Volume (cm) : 77.10% 97.70% -122.70% % Reduction in A rea: 77.30% 98.60% -350.00% % Reduction in Volume: Category/Stage III Full Thickness With Exposed Support Category/Stage IV Classification: Structures Medium Medium Medium Exudate A mount: Serosanguineous Serous Serous Exudate Type: red, brown amber amber Exudate Color: Distinct, outline attached Epibole Distinct, outline attached Wound Margin: Large (67-100%) Large (67-100%) Small (1-33%) Granulation A mount: Red, Pink Red Pink Granulation Quality: Small (1-33%) Small (1-33%) Large (67-100%) Necrotic A mount: Fat Layer (Subcutaneous Tissue): Yes Fat Layer (Subcutaneous Tissue): Yes Fat Layer (Subcutaneous  Tissue): Yes Exposed Structures: Fascia: No Fascia: No Fascia: No Tendon: No Tendon: No Tendon: No Muscle: No Muscle: No Muscle: No Joint: No Joint: No Joint: No Bone: No Bone: No Bone: No Small (1-33%) Medium (34-66%) Small (1-33%) Epithelialization: Debridement - Selective/Open Wound N/A Debridement - Selective/Open Wound Debridement: Pre-procedure Verification/Time Out 14:28 N/A 14:28 Taken: Lidocaine 4% Topical Solution N/A Lidocaine 4% Topical Solution Pain Control: Slough N/A Slough Tissue Debrided: Non-Viable Tissue N/A Non-Viable Tissue Level: 0.35 N/A 0.31 Debridement A (sq cm): rea Curette N/A Curette Instrument: Minimum N/A Minimum Bleeding: Pressure N/A Pressure Hemostasis A chieved: Procedure was tolerated well N/A Procedure was tolerated well Debridement Treatment Response: 0.9x0.5x0.1 N/A 0.8x0.5x0.2 Post Debridement Measurements L x W x D (cm) 0.035 N/A 0.063 Post Debridement Volume: (cm) Category/Stage III N/A Category/Stage IV Post Debridement Stage: No Abnormalities Noted No Abnormalities Noted No Abnormalities Noted Periwound Skin Texture: Maceration: Yes No Abnormalities Noted Maceration: Yes Periwound Skin Moisture: Erythema: Yes Erythema: No Erythema: Yes Periwound Skin Color: Rubor: No N/A N/A Circumferential Erythema Location: Measured: 1.5cm N/A N/A Erythema Measurement: No Change N/A N/A Erythema Change: No Abnormality No Abnormality No Abnormality Temperature: N/A Yes Yes Tenderness on Palpation: Debridement N/A Debridement Procedures Performed: Treatment Notes Electronic Signature(s) Signed: 10/28/2022 2:43:14 PM By: Duanne Guess MD FACS Entered By: Duanne Guess on 10/28/2022 14:43:13 -------------------------------------------------------------------------------- Multi-Disciplinary Care Plan Details Patient Name: Date of Service: Andrea Yu 10/28/2022 2:15 PM Medical Record Number:  [id number] Patient Account Number: [id number] Date of Birth/Sex: Treating RN: 03-30-1940 (81 y.o. Andrea Yu Primary Care Lylah Lantis: Jarome Matin Other Clinician: Referring Tamma Brigandi: Treating Kiante Petrovich/Extender: Payzlee, Ryder ([id number]) [id number] Page 4 of 10 Weeks in Treatment: 48 Multidisciplinary Care Plan reviewed with physician Active Inactive Pressure Nursing Diagnoses: Knowledge deficit related to causes and risk factors for pressure ulcer development Knowledge deficit related to management of pressures ulcers Potential for impaired tissue integrity related to pressure, friction, moisture, and shear Goals: Patient/caregiver will verbalize understanding of pressure ulcer management Date Initiated: 11/24/2021 Target Resolution Date: 11/24/2022 Goal Status: Active Interventions: Assess: immobility, friction, shearing, incontinence upon admission and as needed Assess offloading mechanisms upon admission and as needed Assess potential for pressure ulcer upon admission and as needed Provide education on pressure ulcers Treatment Activities: Pressure reduction/relief device ordered : 11/24/2021 Notes: Wound/Skin Impairment Nursing Diagnoses: Impaired tissue integrity Knowledge deficit related to ulceration/compromised skin integrity Goals: Patient/caregiver will verbalize understanding of skin care regimen Date Initiated: 11/24/2021 Target Resolution Date: 11/24/2022 Goal Status: Active Ulcer/skin breakdown will have a volume reduction of 30% by week 4 Date Initiated: 11/24/2021 Date Inactivated: 05/31/2022 Target Resolution Date: 05/25/2022 Unmet Reason: refuses VAC, Goal Status: Unmet noncompliant with offloading Interventions: Assess patient/caregiver ability to obtain necessary supplies Assess  patient/caregiver ability to perform ulcer/skin care regimen upon admission and as needed Assess  ulceration(s) every visit Provide education on ulcer and skin care Treatment Activities: Skin care regimen initiated : 11/24/2021 Topical wound management initiated : 11/24/2021 Notes: Electronic Signature(s) Signed: 10/28/2022 3:36:57 PM By: Samuella Bruin Entered By: Samuella Bruin on 10/28/2022 14:27:03 -------------------------------------------------------------------------------- Pain Assessment Details Patient Name: Date of Service: Andrea Yu 10/28/2022 2:15 PM Medical Record Number: [id number] Patient Account Number: [id number] Date of Birth/Sex: Treating RN: 20-Sep-1940 (82 y.o. Andrea Yu Primary Care Jessejames Steelman: Jarome Matin Other Clinician: [name] ([id number]) [id number] Page 5 of 10 Referring Falisha Osment: Treating Cabella Kimm/Extender: Marena Chancy in Treatment: 48 Active Problems Location of Pain Severity and Description of Pain Patient Has Paino No Site Locations Rate the pain. Current Pain Level: 0 Pain Management and Medication Current Pain Management: Electronic Signature(s) Signed: 10/28/2022 3:36:57 PM By: Samuella Bruin Entered By: Samuella Bruin on 10/28/2022 14:06:52 -------------------------------------------------------------------------------- Patient/Caregiver Education Details Patient Name: Date of Service: Andrea Yu 10/3/2024andnbsp2:15 PM Medical Record Number: [id number] Patient Account Number: [id number] Date of Birth/Gender: Treating RN: 06-28-1940 (82 y.o. Andrea Yu Primary Care Physician: Jarome Matin Other Clinician: Referring Physician: Treating Physician/Extender: Marena Chancy in Treatment: 62 Education Assessment Education Provided To: Patient and Caregiver Education Topics Provided Wound/Skin Impairment: Methods: Explain/Verbal Responses: Reinforcements needed Electronic  Signature(s) Signed: 10/28/2022 3:36:57 PM By: Samuella Bruin Entered By: Samuella Bruin on 10/28/2022 14:31:54 [name] ([id number]) [id number] Page 6 of 10 -------------------------------------------------------------------------------- Wound Assessment Details Patient Name: Date of Service: Andrea Yu 10/28/2022 2:15 PM Medical Record Number: [id number] Patient Account Number: [id number] Date of Birth/Sex: Treating RN: 07/24/1940 (82 y.o. Andrea Yu Primary Care Varnell Donate: Jarome Matin Other Clinician: Referring Pope Brunty: Treating Denzell Colasanti/Extender: Marena Chancy in Treatment: 48 Wound Status Wound Number: 1 Primary Pressure Ulcer Etiology: Wound Location: Right, Medial Malleolus Wound Open Wounding Event: Pressure Injury Status: Date Acquired: 11/16/2021 Comorbid Cataracts, Arrhythmia, Hypertension, Osteoarthritis, Weeks Of Treatment: 48 History: Osteomyelitis, Neuropathy, Confinement Anxiety Clustered Wound: No Photos Wound Measurements Length: (cm) 0 Width: (cm) 0 Depth: (cm) 0 Area: (cm) Volume: (cm) .9 % Reduction in Area: 77.1% .5 % Reduction in Volume: 77.3% .1 Epithelialization: Small (1-33%) 0.353 Tunneling: No 0.035 Undermining: No Wound Description Classification: Category/Stage III Wound Margin: Distinct, outline attached Exudate Amount: Medium Exudate Type: Serosanguineous Exudate Color: red, brown Foul Odor After Cleansing: No Slough/Fibrino Yes Wound Bed Granulation Amount: Large (67-100%) Exposed Structure Granulation Quality: Red, Pink Fascia Exposed: No Necrotic Amount: Small (1-33%) Fat Layer (Subcutaneous Tissue) Exposed: Yes Necrotic Quality: Adherent Slough Tendon Exposed: No Muscle Exposed: No Joint Exposed: No Bone Exposed: No Periwound Skin Texture Texture Color No Abnormalities Noted: Yes No Abnormalities Noted: No Erythema:  Yes Moisture Erythema Measurement: Measured No Abnormalities Noted: No 1.5 cm Maceration: Yes Erythema Change: No Change Temperature / Pain Temperature: No Abnormality Treatment Notes Wound #1 (Malleolus) Wound Laterality: Right, Medial KEENA, DINSE A ([id number]) [id number] Page 7 of 10 Cleanser Peri-Wound Care Topical Primary Dressing Promogran Prisma Matrix, 4.34 (sq in) (silver collagen) Discharge Instruction: Moisten collagen with saline or hydrogel Secondary Dressing Woven Gauze Sponge, Non-Sterile 4x4 in Discharge Instruction: Apply over primary dressing as directed. Secured With [employer], 4.5x3.1 (in/yd) Discharge Instruction: Secure with Kerlix as directed. Compression Wrap Compression Stockings Add-Ons Electronic Signature(s) Signed: 10/28/2022 3:36:57 PM By: Samuella Bruin Signed: 10/29/2022 12:22:42 PM By: Hunt Oris Entered By: Hunt Oris on 10/28/2022  14:16:05 -------------------------------------------------------------------------------- Wound Assessment Details Patient Name: Date of Service: Andrea Yu 10/28/2022 2:15 PM Medical Record Number: [id number] Patient Account Number: [id number] Date of Birth/Sex: Treating RN: May 04, 1940 (82 y.o. Andrea Yu Primary Care Rutherford Alarie: Jarome Matin Other Clinician: Referring Lilyian Quayle: Treating Timur Nibert/Extender: Marena Chancy in Treatment: 48 Wound Status Wound Number: 2 Primary Abscess Etiology: Wound Location: Right Gluteus Wound Open Wounding Event: Bump Status: Date Acquired: 09/28/2021 Comorbid Cataracts, Arrhythmia, Hypertension, Osteoarthritis, Weeks Of Treatment: 48 History: Osteomyelitis, Neuropathy, Confinement Anxiety Clustered Wound: No Photos Wound Measurements Length: (cm) 0.3 Width: (cm) 1 Depth: (cm) 1.8 Area: (cm) 0.236 Volume: (cm) 0.424 % Reduction in Area: 97.7% % Reduction in Volume:  98.6% Epithelialization: Medium (34-66%) Tunneling: No Undermining: No Wound Description EUGENA, RHUE A ([id number]) Classification: Full Thickness With Exposed Support Structures Wound Margin: Epibole Exudate Amount: Medium Exudate Type: Serous Exudate Color: amber [id number] Page 8 of 10 Foul Odor After Cleansing: No Slough/Fibrino Yes Wound Bed Granulation Amount: Large (67-100%) Exposed Structure Granulation Quality: Red Fascia Exposed: No Necrotic Amount: Small (1-33%) Fat Layer (Subcutaneous Tissue) Exposed: Yes Necrotic Quality: Adherent Slough Tendon Exposed: No Muscle Exposed: No Joint Exposed: No Bone Exposed: No Periwound Skin Texture Texture Color No Abnormalities Noted: Yes No Abnormalities Noted: Yes Moisture Temperature / Pain No Abnormalities Noted: Yes Temperature: No Abnormality Tenderness on Palpation: Yes Treatment Notes Wound #2 (Gluteus) Wound Laterality: Right Cleanser Peri-Wound Care Topical Primary Dressing Dakin's Solution 0.25%, 16 (oz) Discharge Instruction: Moisten gauze with Dakin's solution Promogran Prisma Matrix, 4.34 (sq in) (silver collagen) Discharge Instruction: Moisten collagen with saline or hydrogel Secondary Dressing Zetuvit Plus Silicone Border Heel Dressing10x10 (in/in) Discharge Instruction: Apply silicone border over primary dressing as directed. Secured With Compression Wrap Compression Stockings Facilities manager) Signed: 10/28/2022 3:36:57 PM By: Samuella Bruin Signed: 10/29/2022 12:22:42 PM By: Hunt Oris Entered By: Hunt Oris on 10/28/2022 14:23:13 -------------------------------------------------------------------------------- Wound Assessment Details Patient Name: Date of Service: Andrea Yu 10/28/2022 2:15 PM Medical Record Number: [id number] Patient Account Number: [id number] Date of Birth/Sex: Treating RN: 06-24-1940 (81 y.o. Andrea Yu Primary Care Lailyn Appelbaum: Jarome Matin Other Clinician: Referring Gizell Danser: Treating Tamya Denardo/Extender: Marena Chancy in Treatment: 48 Wound Status Wound Number: 4 Primary Pressure Ulcer Etiology: Wound Location: Left, Lateral Ankle KATHARINA, JEHLE A ([id number]) [id number] Page 9 of 10 Wound Open Wounding Event: Pressure Injury Status: Date Acquired: 12/23/2021 Comorbid Cataracts, Arrhythmia, Hypertension, Osteoarthritis, Weeks Of Treatment: 42 History: Osteomyelitis, Neuropathy, Confinement Anxiety Clustered Wound: No Photos Wound Measurements Length: (cm) 0.8 Width: (cm) 0.5 Depth: (cm) 0.2 Area: (cm) 0.314 Volume: (cm) 0.063 % Reduction in Area: -122.7% % Reduction in Volume: -350% Epithelialization: Small (1-33%) Tunneling: No Undermining: No Wound Description Classification: Category/Stage IV Wound Margin: Distinct, outline attached Exudate Amount: Medium Exudate Type: Serous Exudate Color: amber Foul Odor After Cleansing: No Slough/Fibrino Yes Wound Bed Granulation Amount: Small (1-33%) Exposed Structure Granulation Quality: Pink Fascia Exposed: No Necrotic Amount: Large (67-100%) Fat Layer (Subcutaneous Tissue) Exposed: Yes Necrotic Quality: Adherent Slough Tendon Exposed: No Muscle Exposed: No Joint Exposed: No Bone Exposed: No Periwound Skin Texture Texture Color No Abnormalities Noted: Yes No Abnormalities Noted: No Erythema: Yes Moisture Erythema Location: Circumferential No Abnormalities Noted: No Rubor: No Maceration: Yes Temperature / Pain Temperature: No Abnormality Tenderness on Palpation: Yes Treatment Notes Wound #4 (Ankle) Wound Laterality: Left, Lateral Cleanser Peri-Wound Care Topical Primary Dressing Promogran Prisma Matrix, 4.34 (sq in) (silver collagen) Discharge Instruction: Moisten collagen with saline or  hydrogel Secondary Dressing Woven Gauze Sponge,  Non-Sterile 4x4 in Discharge Instruction: Apply over primary dressing as directed. Secured With [employer], 4.5x3.1 (in/yd) Discharge Instruction: Secure with Kerlix as directed. CALLA, WEDEKIND A ([id number]) [id number] Page 10 of 10 Compression Wrap Compression Stockings Add-Ons Electronic Signature(s) Signed: 10/28/2022 3:36:57 PM By: Samuella Bruin Signed: 10/29/2022 12:22:42 PM By: Hunt Oris Entered By: Hunt Oris on 10/28/2022 14:16:47 -------------------------------------------------------------------------------- Vitals Details Patient Name: Date of Service: Andrea Yu 10/28/2022 2:15 PM Medical Record Number: [id number] Patient Account Number: [id number] Date of Birth/Sex: Treating RN: March 06, 1940 (81 y.o. Andrea Yu Primary Care Kingston Guiles: Jarome Matin Other Clinician: Referring Batya Citron: Treating Jisella Ashenfelter/Extender: Marena Chancy in Treatment: 48 Vital Signs Time Taken: 14:14 Temperature (F): 97.6 Height (in): 59 Pulse (bpm): 70 Weight (lbs): 155 Respiratory Rate (breaths/min): 16 Body Mass Index (BMI): 31.3 Blood Pressure (mmHg): 131/66 Reference Range: 80 - 120 mg / dl Electronic Signature(s) Signed: 10/28/2022 3:36:57 PM By: Samuella Bruin Entered By: Samuella Bruin on 10/28/2022 14:14:52

## 2022-11-02 ENCOUNTER — Ambulatory Visit (HOSPITAL_BASED_OUTPATIENT_CLINIC_OR_DEPARTMENT_OTHER): Payer: Medicare Other | Admitting: General Surgery

## 2022-11-09 ENCOUNTER — Encounter (HOSPITAL_BASED_OUTPATIENT_CLINIC_OR_DEPARTMENT_OTHER): Payer: Medicare Other | Admitting: General Surgery

## 2022-11-09 DIAGNOSIS — L89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, stage 3: Secondary | ICD-10-CM | POA: Diagnosis not present

## 2022-11-09 NOTE — Progress Notes (Signed)
Andrea [name], Andrea [name] [name] ([id number]) [id number] Page 1 of 13 Visit Report for 11/09/2022 Chief Complaint Document Details Patient Name: Date of Service: Andrea [name] 11/09/2022 1:15 PM Medical Record Number: [id number] Patient Account Number: [id number] Date of Birth/Sex: Treating RN: 10/04/1940 (82 y.o. F) Primary Care Provider: Jarome Matin Other Clinician: Referring Provider: Treating Provider/Extender: Marena Chancy in Treatment: 50 Information Obtained from: Patient Chief Complaint Patient is at the clinic for treatment of an open pressure ulcer on her right medial ankle, and [name] large abscess on her right buttock Electronic Signature(s) Signed: 11/09/2022 2:00:14 PM By: Duanne Guess MD FACS Entered By: Duanne Guess on 11/09/2022 14:00:14 -------------------------------------------------------------------------------- Debridement Details Patient Name: Date of Service: Andrea [name] 11/09/2022 1:15 PM Medical Record Number: [id number] Patient Account Number: [id number] Date of Birth/Sex: Treating RN: 1940-12-11 (81 y.o. Andrea [name] Primary Care Provider: Jarome Matin Other Clinician: Referring Provider: Treating Provider/Extender: Marena Chancy in Treatment: 50 Debridement Performed for Assessment: Wound #4 Left,Lateral Ankle Performed By: Physician Duanne Guess, MD The following information was scribed by: Zenaida Deed The information was scribed for: Duanne Guess Debridement Type: Debridement Level of Consciousness (Pre-procedure): Awake and Alert Pre-procedure Verification/Time Out Yes - 13:50 Taken: Start Time: 13:53 Pain Control: Lidocaine 4% T opical Solution Percent of Wound Bed Debrided: 100% T Area Debrided (cm): otal 0.38 Tissue and other material debrided: Non-Viable, Slough, Slough Level: Non-Viable Tissue Debridement Description: Selective/Open  Wound Instrument: Curette Bleeding: Minimum Hemostasis Achieved: Pressure Procedural Pain: 0 Post Procedural Pain: 0 Response to Treatment: Procedure was tolerated well Level of Consciousness (Post- Awake and Alert procedure): Post Debridement Measurements of Total Wound Length: (cm) 0.8 Stage: Category/Stage IV Width: (cm) 0.6 Depth: (cm) 0.1 Volume: (cm) 0.038 Character of Wound/Ulcer Post Debridement: Improved Andrea [name], Andrea [name] ([id number]) [id number] Page 2 of 13 Post Procedure Diagnosis Same as Pre-procedure Electronic Signature(s) Signed: 11/09/2022 2:19:09 PM By: Duanne Guess MD FACS Signed: 11/09/2022 4:01:24 PM By: Zenaida Deed RN, BSN Entered By: Zenaida Deed on 11/09/2022 13:56:04 -------------------------------------------------------------------------------- Debridement Details Patient Name: Date of Service: Andrea [name] 11/09/2022 1:15 PM Medical Record Number: [id number] Patient Account Number: [id number] Date of Birth/Sex: Treating RN: October 05, 1940 (81 y.o. Andrea [name] Primary Care Provider: Jarome Matin Other Clinician: Referring Provider: Treating Provider/Extender: Marena Chancy in Treatment: 50 Debridement Performed for Assessment: Wound #1 Right,Medial Malleolus Performed By: Physician Duanne Guess, MD The following information was scribed by: Zenaida Deed The information was scribed for: Duanne Guess Debridement Type: Debridement Level of Consciousness (Pre-procedure): Awake and Alert Pre-procedure Verification/Time Out Yes - 13:50 Taken: Start Time: 13:53 Pain Control: Lidocaine 4% T opical Solution Percent of Wound Bed Debrided: 100% T Area Debrided (cm): otal 0.22 Tissue and other material debrided: Non-Viable, Slough, Skin: Epidermis, Slough Level: Skin/Epidermis Debridement Description: Selective/Open Wound Instrument: Curette Bleeding:  Minimum Hemostasis Achieved: Pressure Procedural Pain: 0 Post Procedural Pain: 0 Response to Treatment: Procedure was tolerated well Level of Consciousness (Post- Awake and Alert procedure): Post Debridement Measurements of Total Wound Length: (cm) 0.7 Stage: Category/Stage III Width: (cm) 0.4 Depth: (cm) 0.1 Volume: (cm) 0.022 Character of Wound/Ulcer Post Debridement: Improved Post Procedure Diagnosis Same as Pre-procedure Electronic Signature(s) Signed: 11/09/2022 2:19:09 PM By: Duanne Guess MD FACS Signed: 11/09/2022 4:01:24 PM By: Zenaida Deed RN, BSN Entered By: Zenaida Deed on 11/09/2022 13:57:17 HPI Details -------------------------------------------------------------------------------- Andrea [name] ([id number]) [id number] Page 3 of 13 Patient Name: Date of Service: Andrea [name]  [name]. 11/09/2022 1:15 PM Medical Record Number: [id number] Patient Account Number: [id number] Date of Birth/Sex: Treating RN: [birth year] (82 y.o. 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 She is not diabetic and quit smoking over 40 years ago. 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 She has developed [name] pressure ulcer on the right medial malleolus. In addition, she apparently has had multiple cutaneous abscesses on her buttocks that have required repeated incision and drainage procedures. She has developed [name] large abscess on her right buttock that has become painful. She was recently treated with cephalexin by her PCP for urinary tract infection and also received [name] shot of Rocephin for the abscess,  but it has not yet been incised or drained. She is nonambulatory but is able to stand to transfer. She does not wear any sort of Prevalon boot. ABI in clinic today was 0.96. On her right medial malleolus, there is [name] circular ulcer. The surface is dry and fibrotic. There is some periwound erythema, but no malodor or purulent drainage. On her right buttock, there is [name] large fluctuant bulge about the size of an orange. It is also erythematous and tender. 12/04/2021: The culture from her abscess grew out staph. [name] 10-day course of Bactrim was prescribed and she is currently taking this. Unfortunately, Dakin's solution was not delivered and only 2 x 2 gauze was sent, rather than Kerlix so the packing of the wound has been rather suboptimal. The wound cavity has light slough over all of the surfaces. Although covered, bone is palpable. The medial ankle wound looks about the same with [name] layer of slough accumulation. In addition, her daughter peeled some dry skin off of her foot this morning and she now has denuded areas over the majority of the plantar surface of her right foot. 01/01/2022: The patient has missed several visits and to her daughters report that it is somewhat difficult to get her here on [name] weekly basis. In the interim, she has developed [name] new pressure ulcer on her left lateral malleolus. The plantar surface of her right foot has healed. The cavity on her right buttock has contracted, but it remains quite deep and the trochanter, while not exposed, is palpable at the base. The pressure ulcer on her right medial malleolus is basically unchanged. 02/09/2022: All wounds are little bit smaller today. As the buttock abscess cavity has contracted, it has left some tunneling that has not been adequately packed. Light slough accumulation on both ankle wounds. 02/26/2022: All of the wounds continue to contract. There is slough on both ankle wounds. The buttock ulcer is clean. She brought her wound VAC with  her today. 03/12/2022: The ankle wounds are about the same this week with slough accumulation. The buttock ulcer is smaller and quite clean. She decided that she could not tolerate the discomfort of the wound VAC and removed it. They have been packing the wound with Dakin's moistened gauze. 03/25/2022: The ankle wounds are slightly smaller. Both have slough accumulation. The orifice of the buttock ulcer has gotten quite [name] bit smaller than the underlying cavity. She has decided that she would like to have the wound VAC back. 3/14; patient presents for follow-up. She has bilateral ankle wounds  and has been using Hydrofera Blue to the these wound beds. She has Prevalon boots to help with offloading. She also has [name] sacral wound that we have been using [name] wound VAC for. She has no issues or complaints today. 04/29/2022: She once again decided she did not like the wound VAC and so they have been packing her gluteal wound with Dakin's moistened gauze. The gluteal ulcer seems to be about the same. Her left lateral ankle wound has gotten deeper and larger. The surface tissue is gray with thick slough. No significant change to the right medial ankle wound. 05/18/2022: The depth on the gluteal ulcer has come in [name] bit. There was [name] very strong pungent odor when the packing was removed, however. The right medial ankle wound is about the same size, but the tissue surface looks healthier. The left lateral ankle wound has reaccumulated the rubbery gray slough. 05/31/2022: The gluteal ulcer is shallower and no longer has any dips or crevices. The odor has abated completely. The right medial ankle wound is unchanged. The left lateral ankle wound looks quite [name] bit healthier with [name] cleaner surface and the beginnings of granulation tissue emergence. 06/16/2022: The gluteal ulcer has the same measurements more or less, but overall the volume seems smaller, as the cavity feels tighter on examination. The ankle wounds are basically  the same. 07/12/2022: The cavity of the buttock ulcer continues to contract. The left ankle wound has [name] little bit more granulation tissue, but bone remains exposed. The right ankle wound is flush with the surrounding skin. 07/21/2022: There is 1 deeper area in the buttock ulcer that I have not appreciated previously. I can feel bone under [name] layer of tissue. The rest of the cavity has contracted. Both ankle wounds are filling in with better granulation tissue. There is slough on both ankle wound surfaces. She struck her leg on her bed frame and has 2 new wounds on her right anterior tibial surface. Both have fat layer exposure but no concern for infection. 08/02/2022: I do not feel bone as easily in the buttock ulcer. There is slough on the surface. The cavity seems to be contracting. Both ankle wounds have improved tissue quality and there is granulation tissue nearly completely covering the bone on the left lateral malleolus. The wounds on her right anterior tibial surface have dried up. 08/10/2022: The cavity at the buttock ulcer has contracted further. The depth remains about the same. The surface is cleaner this week. Both ankle wounds are little bit smaller today. They have rubbery slough on the surface which appears to be leftover Oasis. Underneath, the quality of the tissue continues to improve and there is no longer any bone exposed on the left lateral malleolus. 08/16/2022: The buttock ulcer cavity has contracted further and she has less tenderness at the deepest aspect of the wound. Both ankle wounds look smaller today, although they did not measure any differently. Minimal slough accumulation with fairly healthy-looking granulation tissue present. 08/24/2022: The buttock ulcer cavity is smaller again today. She no longer has the tenderness at the deepest aspect of the wound. Both ankle wounds measured about [name] millimeter smaller. There is healthy-looking granulation tissue present bilaterally with  minimal slough on the left and no slough seen on the right. 08/31/2022: The cavity of the buttock ulcer continues to contract. It is about half [name] centimeter shallower than last week and there is no undermining. The cavity is also narrower and is more difficult to get an index  finger into the wound. Both ankle wounds measured slightly smaller. Both have [name] rather dry skin edge but good granulation tissue present with minimal slough. 09/08/2022: The buttock ulcer cavity is smaller again today. It is shallower and narrower. Both ankle wounds were [name] little bit smaller, as well. There is [name] fair amount of slough on the left lateral ankle wound. 09/14/2022: Unfortunately, the buttock ulcer cavity measured [name] little larger and [name] little deeper today. The patient did say she has been sitting [name] bit more of late. The right medial ankle wound is about the same size; the left lateral ankle wound is [name] little smaller and shallower. There is an area on the medial aspect of her Andrea [name], Andrea [name] ([id number]) [id number] Page 4 of 13 right calcaneus that looks concerning for potential pressure-induced deep tissue injury. 09/29/2022: Both ankle wounds measured smaller today. Underneath the dried-on Oasis and slough, there is actually fairly healthy-looking granulation tissue filling in. The gluteal ulcer is narrower and tighter, but the depth has not really decreased at all. 10/05/2022: Once again, both ankle wounds measured slightly smaller. The tissue surfaces look healthy. The gluteal ulcer continues to contract circumferentially, but the depth remains stable. 10/12/2022: The depth on the gluteal ulcer came in by about half [name] centimeter this week. Both ankles look about the same with rubbery slough overlying granulation tissue. The granulation tissue is not quite as pink and robust-appearing as on previous occasions. 10/28/2022: The gluteal ulcer is shallower again this week without any tunneling. It  continues to contract in all dimensions and the surface is quite clean. Both ankle wounds are little bit smaller today. They have slough on the surfaces, but the underlying granulation tissue is healthier-looking. 11/09/2022: The ankle wounds continue to contract and fill with granulation tissue. They are quite clean with minimal slough on the surfaces. The gluteal ulcer has skin growing down the sides of the orifice. It measured shallower today. It is clean. Electronic Signature(s) Signed: 11/09/2022 2:01:04 PM By: Duanne Guess MD FACS Entered By: Duanne Guess on 11/09/2022 14:01:04 -------------------------------------------------------------------------------- Physical Exam Details Patient Name: Date of Service: Andrea [name] 11/09/2022 1:15 PM Medical Record Number: [id number] Patient Account Number: [id number] Date of Birth/Sex: Treating RN: 12-23-1940 (82 y.o. F) Primary Care Provider: Jarome Matin Other Clinician: Referring Provider: Treating Provider/Extender: Marena Chancy in Treatment: 50 Constitutional . . . . no acute distress. Respiratory Normal work of breathing on room air. Notes 11/09/2022: The ankle wounds continue to contract and fill with granulation tissue. They are quite clean with minimal slough on the surfaces. The gluteal ulcer has skin growing down the sides of the orifice. It measured shallower today. It is clean. Electronic Signature(s) Signed: 11/09/2022 2:13:23 PM By: Duanne Guess MD FACS Previous Signature: 11/09/2022 2:02:00 PM Version By: Duanne Guess MD FACS Entered By: Duanne Guess on 11/09/2022 14:13:22 -------------------------------------------------------------------------------- Physician Orders Details Patient Name: Date of Service: Andrea [name] 11/09/2022 1:15 PM Medical Record Number: [id number] Patient Account Number: [id number] Date of Birth/Sex: Treating RN: 1940/10/12 (81 y.o. 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 - Dr. Lady Gary rm 1 Return [name] Tuesday 10/29 @ 1:15 pm Anesthetic (In clinic) Topical Lidocaine 4% applied to wound bed Bathing/ Shower/ Hygiene May shower and wash wound with soap and water. - with dressing changes Off-Loading Multipodus Splint to: - prevalon boot to both feet Turn and reposition every 2 hours Home Health No change in wound care orders this week; continue Home Health for wound care. May utilize formulary equivalent dressing for wound treatment orders unless otherwise specified. Dressing changes to be completed by Home Health on Monday / Wednesday / Friday except when patient has scheduled visit at [facility]. Other Home Health Orders/Instructions: - [facility] Wound Treatment Wound #1 - Malleolus Wound Laterality: Right, Medial Cleanser: Normal Saline (DME) (Generic) 3 x Per Week/30 Days Discharge Instructions: Cleanse the wound with Normal Saline prior to applying [name] clean dressing using gauze sponges, not tissue or cotton balls. Prim Dressing: Promogran Prisma Matrix, 4.34 (sq in) (silver collagen) (DME) (Dispense As  Written) 3 x Per Week/30 Days ary Discharge Instructions: Moisten collagen with saline or hydrogel Secondary Dressing: Optifoam Non-Adhesive Dressing, 4x4 in (DME) (Generic) 3 x Per Week/30 Days Discharge Instructions: Apply over primary dressing cut to make [name] foam donut Secondary Dressing: Woven Gauze Sponges 2x2 in (DME) (Generic) 3 x Per Week/30 Days Discharge Instructions: Apply over primary dressing as directed. Secured With: [employer], 4.5x3.1 (in/yd) 3 x Per Week/30 Days Discharge Instructions: Secure with Kerlix as directed. Secured With: 65M Medipore H Soft Cloth Surgical T ape, 4 x 10 (in/yd) (DME) (Generic) 3 x Per Week/30 Days Discharge Instructions: Secure with tape as directed. Wound #2 - Gluteus Wound Laterality: Right Prim Dressing: Dakin's Solution 0.25%, 16 (oz) 1 x Per Day/30 Days ary Discharge Instructions: Moisten gauze with Dakin's solution Prim Dressing: Promogran Prisma Matrix, 4.34 (sq in) (silver collagen) (DME) (Dispense As Written) 1 x Per Day/30 Days ary Discharge Instructions: Moisten collagen with saline or hydrogel Secondary Dressing: Zetuvit Plus Silicone Border Dressing 4x4 (in/in) (DME) (Dispense As Written) 1 x Per Day/30 Days Discharge Instructions: Apply silicone border over primary dressing as directed. Wound #4 - Ankle Wound Laterality: Left, Lateral Cleanser: Normal Saline (DME) (Generic) 3 x Per Week/30 Days Discharge Instructions: Cleanse the wound with Normal Saline prior to applying [name] clean dressing using gauze sponges, not tissue or cotton balls. Prim Dressing: Promogran Prisma Matrix, 4.34 (sq in) (silver collagen) (DME) (Dispense As Written) 3 x Per Week/30 Days ary Discharge Instructions: Moisten collagen with saline or hydrogel Secondary Dressing: Optifoam Non-Adhesive Dressing, 4x4 in (DME) (Generic) 3 x Per Week/30 Days Discharge Instructions: Apply over primary dressing cut to make [name] foam donut Secondary Dressing: Woven Gauze  Sponges 2x2 in (DME) (Generic) 3 x Per Week/30 Days Discharge Instructions: Apply over primary dressing as directed. Secured With: [employer], 4.5x3.1 (in/yd) 3 x Per Week/30 Days Discharge Instructions: Secure with Kerlix as directed. Andrea [name], Andrea [name] ([id number]) [id number] Page 6 of 13 Secured With: 65M Medipore H Soft Cloth Surgical T ape, 4 x 10 (in/yd) (DME) (Generic) 3 x Per Week/30 Days Discharge Instructions: Secure with tape as directed. Electronic Signature(s) Signed: 11/09/2022 2:19:09 PM By: Duanne Guess MD FACS Entered By: Duanne Guess on 11/09/2022 14:13:53 -------------------------------------------------------------------------------- Problem List Details Patient Name: Date of  Service: Andrea [name] 11/09/2022 1:15 PM Medical Record Number: [id number] Patient Account Number: [id number] Date of Birth/Sex: Treating RN: 09/29/1940 (82 y.o. 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 01/01/2022 No Yes evidence of necrosis L02.31 Cutaneous abscess of buttock 11/24/2021 No Yes E27.40 Unspecified adrenocortical insufficiency 11/24/2021 No Yes Z79.52 Long term (current) use of systemic steroids 11/24/2021 No Yes I10 Essential (primary) hypertension 11/24/2021 No Yes Inactive Problems ICD-10 Code Description Active Date Inactive Date L97.812 Non-pressure chronic ulcer of other part of right lower leg with fat layer exposed 07/21/2022 07/21/2022 Resolved Problems ICD-10 Code Description Active Date Resolved  Date Andrea [name], Andrea [name] ([id number]) [id number] Page 7 of 13 L97.511 Non-pressure chronic ulcer of other part of right foot limited to breakdown of skin 12/04/2021 12/04/2021 Electronic Signature(s) Signed: 11/09/2022 1:58:50 PM By: Duanne Guess MD FACS Entered By: Duanne Guess on 11/09/2022 13:58:50 -------------------------------------------------------------------------------- Progress Note Details Patient Name: Date of Service: Andrea [name] 11/09/2022 1:15 PM Medical Record Number: [id number] Patient Account Number: [id number] Date of Birth/Sex: Treating RN: [birth year] (82 y.o. 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 She is not diabetic and quit smoking over 40 years ago. 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 She has developed [name] pressure ulcer on the right medial malleolus. In addition, she apparently has had multiple cutaneous abscesses on her buttocks that have required repeated incision and drainage procedures. She has developed [name] large abscess on her right buttock that has become painful. She was recently treated with cephalexin by her PCP for urinary tract infection and also received [name] shot of Rocephin for the abscess, but it has not yet been incised or drained. She is nonambulatory but is able to stand to transfer. She does not wear any sort of Prevalon boot. ABI in clinic today was 0.96. On her  right medial malleolus, there is [name] circular ulcer. The surface is dry and fibrotic. There is some periwound erythema, but no malodor or purulent drainage. On her right buttock, there is [name] large fluctuant bulge about the size of an orange. It is also erythematous and tender. 12/04/2021: The culture from her abscess grew out staph. [name] 10-day course of Bactrim was prescribed and she is currently taking this. Unfortunately, Dakin's solution was not delivered and only 2 x 2 gauze was sent, rather than Kerlix so the packing of the wound has been rather suboptimal. The wound cavity has light slough over all of the surfaces. Although covered, bone is palpable. The medial ankle wound looks about the same with [name] layer of slough accumulation. In addition, her daughter peeled some dry skin off of her foot this morning and she now has denuded areas over the  majority of the plantar surface of her right foot. 01/01/2022: The patient has missed several visits and to her daughters report that it is somewhat difficult to get her here on [name] weekly basis. In the interim, she has developed [name] new pressure ulcer on her left lateral malleolus. The plantar surface of her right foot has healed. The cavity on her right buttock has contracted, but it remains quite deep and the trochanter, while not exposed, is palpable at the base. The pressure ulcer on her right medial malleolus is basically unchanged. 02/09/2022: All wounds are little bit smaller today. As the buttock abscess cavity has contracted, it has left some tunneling that has not been adequately packed. Light slough accumulation on both ankle wounds. 02/26/2022: All of the wounds continue to contract. There is slough on both ankle wounds. The buttock ulcer is clean. She brought her wound VAC with her today. 03/12/2022: The ankle wounds are about the same this week with slough accumulation. The buttock ulcer is smaller and quite clean. She decided that she could not tolerate  the discomfort of the wound VAC and removed it. They have been packing the wound with Dakin's moistened gauze. 03/25/2022: The ankle wounds are slightly smaller. Both have slough accumulation. The orifice of the buttock ulcer has gotten quite [name] bit smaller than the underlying cavity. She has decided that she would like to have the wound VAC back. 3/14; patient presents for follow-up. She has bilateral ankle wounds and has been using Hydrofera Blue to the these wound beds. She has Prevalon boots to help with offloading. She also has [name] sacral wound that we have been using [name] wound VAC for. She has no issues or complaints today. 04/29/2022: She once again decided she did not like the wound VAC and so they have been packing her gluteal wound with Dakin's moistened gauze. The gluteal ulcer seems to be about the same. Her left lateral ankle wound has gotten deeper and larger. The surface tissue is gray with thick slough. No significant change to the right medial ankle wound. 05/18/2022: The depth on the gluteal ulcer has come in [name] bit. There was [name] very strong pungent odor when the packing was removed, however. The right medial ankle wound is about the same size, but the tissue surface looks healthier. The left lateral ankle wound has reaccumulated the rubbery gray slough. 05/31/2022: The gluteal ulcer is shallower and no longer has any dips or crevices. The odor has abated completely. The right medial ankle wound is unchanged. The left lateral ankle wound looks quite [name] bit healthier with [name] cleaner surface and the beginnings of granulation tissue emergence. 06/16/2022: The gluteal ulcer has the same measurements more or less, but overall the volume seems smaller, as the cavity feels tighter on examination. The ankle wounds are basically the same. 07/12/2022: The cavity of the buttock ulcer continues to contract. The left ankle wound has [name] little bit more granulation tissue, but bone remains exposed. The right ankle  wound is flush with the surrounding skin. Andrea [name], Andrea [name] ([id number]) [id number] Page 8 of 13 07/21/2022: There is 1 deeper area in the buttock ulcer that I have not appreciated previously. I can feel bone under [name] layer of tissue. The rest of the cavity has contracted. Both ankle wounds are filling in with better granulation tissue. There is slough on both ankle wound surfaces. She struck her leg on her bed frame and has 2 new wounds on her right anterior tibial surface. Both have  fat layer exposure but no concern for infection. 08/02/2022: I do not feel bone as easily in the buttock ulcer. There is slough on the surface. The cavity seems to be contracting. Both ankle wounds have improved tissue quality and there is granulation tissue nearly completely covering the bone on the left lateral malleolus. The wounds on her right anterior tibial surface have dried up. 08/10/2022: The cavity at the buttock ulcer has contracted further. The depth remains about the same. The surface is cleaner this week. Both ankle wounds are little bit smaller today. They have rubbery slough on the surface which appears to be leftover Oasis. Underneath, the quality of the tissue continues to improve and there is no longer any bone exposed on the left lateral malleolus. 08/16/2022: The buttock ulcer cavity has contracted further and she has less tenderness at the deepest aspect of the wound. Both ankle wounds look smaller today, although they did not measure any differently. Minimal slough accumulation with fairly healthy-looking granulation tissue present. 08/24/2022: The buttock ulcer cavity is smaller again today. She no longer has the tenderness at the deepest aspect of the wound. Both ankle wounds measured about [name] millimeter smaller. There is healthy-looking granulation tissue present bilaterally with minimal slough on the left and no slough seen on the right. 08/31/2022: The cavity of the buttock  ulcer continues to contract. It is about half [name] centimeter shallower than last week and there is no undermining. The cavity is also narrower and is more difficult to get an index finger into the wound. Both ankle wounds measured slightly smaller. Both have [name] rather dry skin edge but good granulation tissue present with minimal slough. 09/08/2022: The buttock ulcer cavity is smaller again today. It is shallower and narrower. Both ankle wounds were [name] little bit smaller, as well. There is [name] fair amount of slough on the left lateral ankle wound. 09/14/2022: Unfortunately, the buttock ulcer cavity measured [name] little larger and [name] little deeper today. The patient did say she has been sitting [name] bit more of late. The right medial ankle wound is about the same size; the left lateral ankle wound is [name] little smaller and shallower. There is an area on the medial aspect of her right calcaneus that looks concerning for potential pressure-induced deep tissue injury. 09/29/2022: Both ankle wounds measured smaller today. Underneath the dried-on Oasis and slough, there is actually fairly healthy-looking granulation tissue filling in. The gluteal ulcer is narrower and tighter, but the depth has not really decreased at all. 10/05/2022: Once again, both ankle wounds measured slightly smaller. The tissue surfaces look healthy. The gluteal ulcer continues to contract circumferentially, but the depth remains stable. 10/12/2022: The depth on the gluteal ulcer came in by about half [name] centimeter this week. Both ankles look about the same with rubbery slough overlying granulation tissue. The granulation tissue is not quite as pink and robust-appearing as on previous occasions. 10/28/2022: The gluteal ulcer is shallower again this week without any tunneling. It continues to contract in all dimensions and the surface is quite clean. Both ankle wounds are little bit smaller today. They have slough on the surfaces, but the underlying  granulation tissue is healthier-looking. 11/09/2022: The ankle wounds continue to contract and fill with granulation tissue. They are quite clean with minimal slough on the surfaces. The gluteal ulcer has skin growing down the sides of the orifice. It measured shallower today. It is clean. Patient History Information obtained from Patient, Caregiver, Chart. Family History Cancer - Father,Maternal  Grandparents,Siblings,Child, Heart Disease - Father, Hypertension - Father, Thyroid Problems - Mother, No family history of Diabetes, Hereditary Spherocytosis, Kidney Disease, Lung Disease, Seizures, Stroke, Tuberculosis. Social History Former smoker - quit 40 yr ago, Marital Status - Widowed, Alcohol Use - Never, Drug Use - No History, Caffeine Use - Daily - tea, soda. Medical History Eyes Patient has history of Cataracts - bil extracted Denies history of Glaucoma, Optic Neuritis Cardiovascular Patient has history of Arrhythmia - afib, Hypertension Endocrine Denies history of Type I Diabetes, Type II Diabetes Genitourinary Denies history of End Stage Renal Disease Integumentary (Skin) Denies history of History of Burn Musculoskeletal Patient has history of Osteoarthritis, Osteomyelitis - pelvic Neurologic Patient has history of Neuropathy Oncologic Denies history of Received Chemotherapy, Received Radiation Psychiatric Patient has history of Confinement Anxiety Denies history of Anorexia/bulimia Hospitalization/Surgery History - TEE. - right ankle fusion. - bil knee replacements. - bil cataract extractions. - posterior lumbar fusion. - vaginal hysterectomy. - cholecystectomy. 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 Vitals Time Taken: 1:24 AM, Height: 59 in, Weight: 155 lbs, BMI: 31.3, Temperature: 97.9 F, Pulse: 76 bpm, Respiratory Rate: 18 breaths/min, Blood Pressure: 127/58 mmHg. Respiratory Normal work of breathing on room air. General Notes: 11/09/2022: The ankle wounds continue to contract and fill with granulation tissue. They are quite clean with minimal slough on the surfaces. The gluteal ulcer has skin growing down the sides of the orifice. It measured shallower today. It is clean. Integumentary (Hair, Skin) Wound #1 status is Open. Original cause of wound was Pressure Injury. The date acquired was: 11/16/2021. The wound has been in treatment 50 weeks. The wound is located on the Right,Medial Malleolus. The wound measures 0.7cm length x 0.4cm width x 0.1cm depth; 0.22cm^2 area and 0.022cm^3 volume. There is Fat Layer (Subcutaneous Tissue) exposed. There is no tunneling or undermining noted. There is [name] medium amount of serosanguineous drainage noted. The wound margin is distinct with the outline attached to the wound base. There is large (67-100%) red, pink granulation within the wound bed. There is [name] small (1- 33%) amount of necrotic tissue within the wound bed. The periwound skin appearance had no abnormalities noted for texture. The periwound skin appearance had no abnormalities noted for moisture. The periwound skin appearance did not exhibit: Erythema. Periwound temperature was noted as No Abnormality. Wound #2 status is Open. Original cause of wound was Bump. The date acquired was: 09/28/2021. The wound has been in treatment 50 weeks. The wound is located on the Right Gluteus. The wound measures 0.7cm length x 0.7cm width x 4cm depth; 0.385cm^2 area and 1.539cm^3 volume. There is Fat Layer (Subcutaneous Tissue) exposed. There is no tunneling or undermining noted. There is [name] medium  amount of serous drainage noted. The wound margin is epibole. There is large (67-100%) red granulation within the wound bed. There is no necrotic tissue within the wound bed. The periwound skin appearance had no abnormalities noted for texture. The periwound skin appearance had no abnormalities noted for moisture. The periwound skin appearance had no abnormalities noted for color. Periwound temperature was noted as No Abnormality. The periwound has tenderness on palpation. Wound #4 status is Open. Original cause of wound was Pressure Injury. The date acquired was: 12/23/2021. The wound has  been in treatment 44 weeks. The wound is located on the Left,Lateral Ankle. The wound measures 0.8cm length x 0.6cm width x 0.1cm depth; 0.377cm^2 area and 0.038cm^3 volume. There is Fat Layer (Subcutaneous Tissue) exposed. There is no tunneling or undermining noted. There is [name] medium amount of serous drainage noted. The wound margin is distinct with the outline attached to the wound base. There is medium (34-66%) pink granulation within the wound bed. There is [name] medium (34-66%) amount of necrotic tissue within the wound bed. The periwound skin appearance had no abnormalities noted for texture. The periwound skin appearance had no abnormalities noted for moisture. The periwound skin appearance did not exhibit: Rubor, Erythema. Periwound temperature was noted as No Abnormality. The periwound has tenderness on palpation. 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 There was [name] Selective/Open Wound  Skin/Epidermis Debridement with [name] total area of 0.22 sq cm performed by Duanne Guess, MD. With the following instrument(s): Curette to remove Non-Viable tissue/material. Material removed includes [facility] and Skin: Epidermis and after achieving pain control using Lidocaine 4% T opical Solution. No specimens were taken. [name] time out was conducted at 13:50, prior to the start of the procedure. [name] Minimum amount of bleeding was controlled with Pressure. The procedure was tolerated well with [name] pain level of 0 throughout and [name] pain level of 0 following the procedure. Post Debridement Measurements: 0.7cm length x 0.4cm width x 0.1cm depth; 0.022cm^3 volume. Post debridement Stage noted as Category/Stage III. Character of Wound/Ulcer Post Debridement is improved. Post procedure Diagnosis Wound #1: Same as Pre-Procedure Wound #4 Pre-procedure diagnosis of Wound #4 is [name] Pressure Ulcer located on the Left,Lateral Ankle . There was [name] Selective/Open Wound Non-Viable Tissue Andrea [name], Andrea [name] ([id number]) [id number] Page 10 of 13 Debridement with [name] total area of 0.38 sq cm performed by Duanne Guess, MD. With the following instrument(s): Curette to remove Non-Viable tissue/material. Material removed includes [facility] after achieving pain control using Lidocaine 4% Topical Solution. No specimens were taken. [name] time out was conducted at 13:50, prior to the start of the procedure. [name] Minimum amount of bleeding was controlled with Pressure. The procedure was tolerated well with [name] pain level of 0 throughout and [name] pain level of 0 following the procedure. Post Debridement Measurements: 0.8cm length x 0.6cm width x 0.1cm depth; 0.038cm^3 volume. Post debridement Stage noted as Category/Stage IV. Character of Wound/Ulcer Post Debridement is improved. Post procedure Diagnosis Wound #4: Same as Pre-Procedure Plan Follow-up Appointments: Return Appointment in 2 weeks. - Dr. Lady Gary rm 1 Tuesday 10/29  @ 1:15 pm Anesthetic: (In clinic) Topical Lidocaine 4% applied to wound bed Bathing/ Shower/ Hygiene: May shower and wash wound with soap and water. - with dressing changes Off-Loading: Multipodus Splint to: - prevalon boot to both feet Turn and reposition every 2 hours Home Health: No change in wound care orders this week; continue Home Health for wound care. May utilize formulary equivalent dressing for wound treatment orders unless otherwise specified. Dressing changes to be completed by Home Health on Monday / Wednesday / Friday except when patient has scheduled visit at [facility]. Other Home Health Orders/Instructions: - [facility] WOUND #1: -  Malleolus Wound Laterality: Right, Medial Cleanser: Normal Saline (DME) (Generic) 3 x Per Week/30 Days Discharge Instructions: Cleanse the wound with Normal Saline prior to applying [name] clean dressing using gauze sponges, not tissue or cotton balls. Prim Dressing: Promogran Prisma Matrix, 4.34 (sq in) (silver collagen) (DME) (Dispense As Written) 3 x Per Week/30 Days ary Discharge Instructions: Moisten collagen with saline or hydrogel Secondary Dressing: Optifoam Non-Adhesive Dressing, 4x4 in (DME) (Generic) 3 x Per Week/30 Days Discharge Instructions: Apply over primary dressing cut to make [name] foam donut Secondary Dressing: Woven Gauze Sponges 2x2 in (DME) (Generic) 3 x Per Week/30 Days Discharge Instructions: Apply over primary dressing as directed. Secured With: [employer], 4.5x3.1 (in/yd) 3 x Per Week/30 Days Discharge Instructions: Secure with Kerlix as directed. Secured With: 38M Medipore H Soft Cloth Surgical T ape, 4 x 10 (in/yd) (DME) (Generic) 3 x Per Week/30 Days Discharge Instructions: Secure with tape as directed. WOUND #2: - Gluteus Wound Laterality: Right Prim Dressing: Dakin's Solution 0.25%, 16 (oz) 1 x Per Day/30 Days ary Discharge Instructions: Moisten gauze with Dakin's solution Prim Dressing: Promogran Prisma  Matrix, 4.34 (sq in) (silver collagen) (DME) (Dispense As Written) 1 x Per Day/30 Days ary Discharge Instructions: Moisten collagen with saline or hydrogel Secondary Dressing: Zetuvit Plus Silicone Border Dressing 4x4 (in/in) (DME) (Dispense As Written) 1 x Per Day/30 Days Discharge Instructions: Apply silicone border over primary dressing as directed. WOUND #4: - Ankle Wound Laterality: Left, Lateral Cleanser: Normal Saline (DME) (Generic) 3 x Per Week/30 Days Discharge Instructions: Cleanse the wound with Normal Saline prior to applying [name] clean dressing using gauze sponges, not tissue or cotton balls. Prim Dressing: Promogran Prisma Matrix, 4.34 (sq in) (silver collagen) (DME) (Dispense As Written) 3 x Per Week/30 Days ary Discharge Instructions: Moisten collagen with saline or hydrogel Secondary Dressing: Optifoam Non-Adhesive Dressing, 4x4 in (DME) (Generic) 3 x Per Week/30 Days Discharge Instructions: Apply over primary dressing cut to make [name] foam donut Secondary Dressing: Woven Gauze Sponges 2x2 in (DME) (Generic) 3 x Per Week/30 Days Discharge Instructions: Apply over primary dressing as directed. Secured With: [employer], 4.5x3.1 (in/yd) 3 x Per Week/30 Days Discharge Instructions: Secure with Kerlix as directed. Secured With: 38M Medipore H Soft Cloth Surgical T ape, 4 x 10 (in/yd) (DME) (Generic) 3 x Per Week/30 Days Discharge Instructions: Secure with tape as directed. 11/09/2022: The ankle wounds continue to contract and fill with granulation tissue. They are quite clean with minimal slough on the surfaces. The gluteal ulcer has skin growing down the sides of the orifice. It measured shallower today. It is clean. I used [name] curette to debride slough off of each of the ankle wounds. The gluteal ulcer did not require any debridement. We will continue Prisma silver collagen to all wound surfaces with Optifoam doughnuts over the ankles and packing the gluteal cavity with  Dakin's-moistened gauze. Follow-up in 2 weeks. Electronic Signature(s) Signed: 11/09/2022 2:16:10 PM By: Duanne Guess MD FACS Entered By: Duanne Guess on 11/09/2022 14:16:10 [name] ([id number]) [id number] Page 11 of 13 -------------------------------------------------------------------------------- HxROS Details Patient Name: Date of Service: Andrea [name] 11/09/2022 1:15 PM Medical Record Number: [id number] Patient Account Number: [id number] Date of Birth/Sex: Treating RN: 08/29/1940 (82 y.o. F) Primary Care Provider: Jarome Matin Other Clinician: Referring Provider: Treating Provider/Extender: Marena Chancy in Treatment: 50 Information Obtained From Patient Caregiver Chart Constitutional Symptoms (General Health) Medical History: Past Medical History Notes: morbid obesity Eyes Medical History: Positive  for: Cataracts - bil extracted Negative for: Glaucoma; Optic Neuritis Ear/Nose/Mouth/Throat Medical History: Past Medical History Notes: hard of hearing Cardiovascular Medical History: Positive for: Arrhythmia - afib; Hypertension Gastrointestinal Medical History: Past Medical History Notes: GERD Endocrine Medical History: Negative for: Type I Diabetes; Type II Diabetes Past Medical History Notes: adrenal insufficiency Genitourinary Medical History: Negative for: End Stage Renal Disease Integumentary (Skin) Medical History: Negative for: History of Burn Musculoskeletal Medical History: Positive for: Osteoarthritis; Osteomyelitis - pelvic Past Medical History Notes: psoriatic arthritis, thoracic discitis, displaced spiral fx right tibia Neurologic Medical History: Positive for: Neuropathy Past Medical History Notes: hx [facility] Oncologic Medical History: Negative for: Received Chemotherapy; Received Radiation Psychiatric Andrea [name], Andrea [name] ([id number])  [id number] Page 12 of 13 Medical History: Positive for: Confinement Anxiety Negative for: Anorexia/bulimia HBO Extended History Items Eyes: Cataracts Immunizations Pneumococcal Vaccine: Received Pneumococcal Vaccination: Yes Received Pneumococcal Vaccination On or After 60th Birthday: Yes Implantable Devices None Hospitalization / Surgery History Type of Hospitalization/Surgery TEE right ankle fusion bil knee replacements bil cataract extractions posterior lumbar fusion vaginal hysterectomy cholecystectomy Family and Social History Cancer: Yes - Father,Maternal Grandparents,Siblings,Child; Diabetes: No; Heart Disease: Yes - Father; Hereditary Spherocytosis: No; Hypertension: Yes - Father; Kidney Disease: No; Lung Disease: No; Seizures: No; Stroke: No; Thyroid Problems: Yes - Mother; Tuberculosis: No; Former smoker - quit 40 yr ago; Marital Status - Widowed; Alcohol Use: Never; Drug Use: No History; Caffeine Use: Daily - tea, soda; Financial Concerns: No; Food, Clothing or Shelter Needs: No; Support System Lacking: No; Transportation Concerns: No Electronic Signature(s) Signed: 11/09/2022 2:19:09 PM By: Duanne Guess MD FACS Entered By: Duanne Guess on 11/09/2022 14:01:22 -------------------------------------------------------------------------------- SuperBill Details Patient Name: Date of Service: Andrea [name] 11/09/2022 Medical Record Number: [id number] Patient Account Number: [id number] Date of Birth/Sex: Treating RN: November 10, 1940 (81 y.o. 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 without evidence of  necrosis L02.31 Cutaneous abscess of buttock E27.40 Unspecified adrenocortical insufficiency Z79.52 Long term (current) use of systemic steroids I10 Essential (primary) hypertension Facility Procedures : Andrea [name], Andrea [name] Code: [id number] AY [name] ([id number]) L89.5 Description: 97597 - DEBRIDE WOUND 1ST 20 SQ CM OR < ICD-10 Diagnosis Description 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, stage 3 [id number] Pressure ulcer of left ankle, stage 3 Modifier: [email or web address] Quantity: 1 f Page 13 of 13 Physician Procedures : CPT4 Code Description Modifier [phone] [id number] - WC PHYS LEVEL 4 - EST PT 25 ICD-10 Diagnosis Description 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, stage 3 L89.523 Pressure ulcer of left ankle, stage 3 L98.415 Non-pressure chronic ulcer of buttock with  muscle involvement without evidence of necrosis Z79.52 Long term (current) use of systemic steroids Quantity: 1 : [id number] - WC PHYS DEBR WO ANESTH 20 SQ CM ICD-10 Diagnosis Description 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, stage 3 L89.523 Pressure ulcer of left ankle, stage 3 Quantity: 1 Electronic Signature(s) Signed: 11/09/2022 2:18:44 PM By: Duanne Guess MD FACS Entered By: Duanne Guess on 11/09/2022 14:18:44

## 2022-11-09 NOTE — Progress Notes (Signed)
Andrea [name] ([id number]) [id number] Page 1 of 10 Visit Report for 11/09/2022 Arrival Information Details Patient Name: Date of Service: Andrea [name] 11/09/2022 1:15 PM Medical Record Number: [id number] Patient Account Number: [id number] Date of Birth/Sex: Treating RN: Dec 05, 1940 (82 y.o. F) Primary Care Sebasthian Stailey: Jarome Matin Other Clinician: Referring Danyka Merlin: Treating Bruna Dills/Extender: Marena Chancy in Treatment: 50 Visit Information History Since Last Visit Added or deleted any medications: No Patient Arrived: Wheel Chair Any new allergies or adverse reactions: No Arrival Time: 13:23 Had [name] fall or experienced change in No Accompanied By: niece activities of daily living that may affect Transfer Assistance: None risk of falls: Patient Identification Verified: Yes Signs or symptoms of abuse/neglect since last visito No Secondary Verification Process Completed: Yes Hospitalized since last visit: No Patient Requires Transmission-Based Precautions: No Implantable device outside of the clinic excluding No Patient Has Alerts: No cellular tissue based products placed in the center since last visit: Has Dressing in Place as Prescribed: Yes Pain Present Now: No Electronic Signature(s) Signed: 11/09/2022 4:01:24 PM By: Zenaida Deed RN, BSN Entered By: Zenaida Deed on 11/09/2022 13:28:46 -------------------------------------------------------------------------------- Encounter Discharge Information Details Patient Name: Date of Service: Andrea [name] 11/09/2022 1:15 PM Medical Record Number: [id number] Patient Account Number: [id number] Date of Birth/Sex: Treating RN: [birth year] (81 y.o. [name] Primary Care Alize Borrayo: Jarome Matin Other Clinician: Referring Winnie Barsky: Treating Shawny Borkowski/Extender: Marena Chancy in Treatment: 50 Encounter Discharge Information Items  Post Procedure Vitals Discharge Condition: Stable Temperature (F): 97.9 Ambulatory Status: Walker Pulse (bpm): 76 Discharge Destination: Home Respiratory Rate (breaths/min): 18 Transportation: Private Auto Blood Pressure (mmHg): 127/58 Accompanied By: caregiver Schedule Follow-up Appointment: Yes Clinical Summary of Care: Patient Declined Electronic Signature(s) Signed: 11/09/2022 4:01:24 PM By: Zenaida Deed RN, BSN Entered By: Zenaida Deed on 11/09/2022 14:27:51 Andrea [name] ([id number]) [id number] Page 2 of 10 -------------------------------------------------------------------------------- Lower Extremity Assessment Details Patient Name: Date of Service: Andrea [name] 11/09/2022 1:15 PM Medical Record Number: [id number] Patient Account Number: [id number] Date of Birth/Sex: Treating RN: 1940-08-01 (82 y.o. [name] Primary Care Savina Olshefski: Jarome Matin Other Clinician: Referring Jalysa Swopes: Treating Choya Tornow/Extender: Marena Chancy in Treatment: 50 Edema Assessment Assessed: [Left: No] [Right: No] Edema: [Left: No] [Right: No] Calf Left: Right: Point of Measurement: From Medial Instep 28 cm 32 cm Ankle Left: Right: Point of Measurement: From Medial Instep 18.2 cm 22 cm Vascular Assessment Pulses: Dorsalis Pedis Palpable: [Left:Yes] [Right:Yes] Extremity colors, hair growth, and conditions: Extremity Color: [Left:Hyperpigmented] [Right:Hyperpigmented] Hair Growth on Extremity: [Left:No] [Right:No] Temperature of Extremity: [Left:Warm < 3 seconds] [Right:Warm < 3 seconds] Electronic Signature(s) Signed: 11/09/2022 4:01:24 PM By: Zenaida Deed RN, BSN Entered By: Zenaida Deed on 11/09/2022 13:31:32 -------------------------------------------------------------------------------- Multi Wound Chart Details Patient Name: Date of Service: Andrea [name] 11/09/2022 1:15 PM Medical Record  Number: [id number] Patient Account Number: [id number] Date of Birth/Sex: Treating RN: 1940-09-08 (81 y.o. F) Primary Care Ein Rijo: Jarome Matin Other Clinician: Referring Asuncion Shibata: Treating Roberts Bon/Extender: Marena Chancy in Treatment: 50 Vital Signs Height(in): 59 Pulse(bpm): 76 Weight(lbs): 155 Blood Pressure(mmHg): 127/58 Body Mass Index(BMI): 31.3 Temperature(F): 97.9 Respiratory Rate(breaths/min): 18 [1:Photos:] [[id number] Page 3 of 10] Right, Medial Malleolus Right Gluteus Left, Lateral Ankle Wound Location: Pressure Injury Bump Pressure Injury Wounding Event: Pressure Ulcer Abscess Pressure Ulcer Primary Etiology: Cataracts, Arrhythmia, Hypertension,Cataracts, Arrhythmia, Hypertension, Cataracts, Arrhythmia, Hypertension, Comorbid History: Osteoarthritis, Osteomyelitis, Osteoarthritis, Osteomyelitis, Osteoarthritis, Osteomyelitis, Neuropathy, Confinement Anxiety Neuropathy, Confinement Anxiety Neuropathy,  Confinement Anxiety 11/16/2021 09/28/2021 12/23/2021 Date Acquired: 50 50 44 Weeks of Treatment: Open Open Open Wound Status: No No No Wound Recurrence: 0.7x0.4x0.1 0.7x0.7x4 0.8x0.6x0.1 Measurements L x W x D (cm) 0.22 0.385 0.377 [name] (cm) : rea 0.022 1.539 0.038 Volume (cm) : 85.70% 96.30% -167.40% % Reduction in [name] rea: 85.70% 95.10% -171.40% % Reduction in Volume: Category/Stage III Full Thickness With Exposed Support Category/Stage IV Classification: Structures Medium Medium Medium Exudate [name] mount: Serosanguineous Serous Serous Exudate Type: red, brown amber amber Exudate Color: Distinct, outline attached Epibole Distinct, outline attached Wound Margin: Large (67-100%) Large (67-100%) Medium (34-66%) Granulation [name] mount: Red, Pink Red Pink Granulation Quality: Small (1-33%) None Present (0%) Medium (34-66%) Necrotic [name] mount: Fat Layer (Subcutaneous Tissue): Yes Fat Layer (Subcutaneous  Tissue): Yes Fat Layer (Subcutaneous Tissue): Yes Exposed Structures: Fascia: No Fascia: No Fascia: No Tendon: No Tendon: No Tendon: No Muscle: No Muscle: No Muscle: No Joint: No Joint: No Joint: No Bone: No Bone: No Bone: No Small (1-33%) Medium (34-66%) Small (1-33%) Epithelialization: Debridement - Selective/Open Wound N/[name] Debridement - Selective/Open Wound Debridement: Pre-procedure Verification/Time Out 13:50 N/[name] 13:50 Taken: Lidocaine 4% Topical Solution N/[name] Lidocaine 4% Topical Solution Pain Control: Slough N/[name] Slough Tissue Debrided: Skin/Epidermis N/[name] Non-Viable Tissue Level: 0.22 N/[name] 0.38 Debridement [name] (sq cm): rea Curette N/[name] Curette Instrument: Minimum N/[name] Minimum Bleeding: Pressure N/[name] Pressure Hemostasis [name] chieved: 0 N/[name] 0 Procedural Pain: 0 N/[name] 0 Post Procedural Pain: Procedure was tolerated well N/[name] Procedure was tolerated well Debridement Treatment Response: 0.7x0.4x0.1 N/[name] 0.8x0.6x0.1 Post Debridement Measurements L x W x D (cm) 0.022 N/[name] 0.038 Post Debridement Volume: (cm) Category/Stage III N/[name] Category/Stage IV Post Debridement Stage: No Abnormalities Noted No Abnormalities Noted No Abnormalities Noted Periwound Skin Texture: Maceration: No No Abnormalities Noted Maceration: No Periwound Skin Moisture: Erythema: No Erythema: No Erythema: No Periwound Skin Color: Rubor: No No Abnormality No Abnormality No Abnormality Temperature: N/[name] Yes Yes Tenderness on Palpation: Debridement N/[name] Debridement Procedures Performed: Treatment Notes Electronic Signature(s) Signed: 11/09/2022 2:00:07 PM By: Duanne Guess MD FACS Entered By: Duanne Guess on 11/09/2022 14:00:07 -------------------------------------------------------------------------------- Multi-Disciplinary Care Plan Details Patient Name: Date of Service: Andrea [name]. 11/09/2022 1:15 PM Medical Record Number: [id number] Patient Account Number: [id number] Date of  Birth/Sex: Treating RN: 17-Jan-1941 (81 y.o. [address] [place], [place] [name] ([id number]) [id number] Page 4 of 10 Primary Care Manjinder Breau: Jarome Matin Other Clinician: Referring Sheron Tallman: Treating Aradia Estey/Extender: Marena Chancy in Treatment: 50 Multidisciplinary Care Plan reviewed with physician Active Inactive Pressure Nursing Diagnoses: Knowledge deficit related to causes and risk factors for pressure ulcer development Knowledge deficit related to management of pressures ulcers Potential for impaired tissue integrity related to pressure, friction, moisture, and shear Goals: Patient/caregiver will verbalize understanding of pressure ulcer management Date Initiated: 11/24/2021 Target Resolution Date: 11/24/2022 Goal Status: Active Interventions: Assess: immobility, friction, shearing, incontinence upon admission and as needed Assess offloading mechanisms upon admission and as needed Assess potential for pressure ulcer upon admission and as needed Provide education on pressure ulcers Treatment Activities: Pressure reduction/relief device ordered : 11/24/2021 Notes: Wound/Skin Impairment Nursing Diagnoses: Impaired tissue integrity Knowledge deficit related to ulceration/compromised skin integrity Goals: Patient/caregiver will verbalize understanding of skin care regimen Date Initiated: 11/24/2021 Target Resolution Date: 11/24/2022 Goal Status: Active Ulcer/skin breakdown will have [name] volume reduction of 30% by week 4 Date Initiated: 11/24/2021 Date Inactivated: 05/31/2022 Target Resolution Date: 05/25/2022 Unmet Reason: refuses VAC, Goal Status: Unmet noncompliant with offloading Interventions: Assess patient/caregiver ability to  obtain necessary supplies Assess patient/caregiver ability to perform ulcer/skin care regimen upon admission and as needed Assess ulceration(s) every visit Provide education on ulcer and  skin care Treatment Activities: Skin care regimen initiated : 11/24/2021 Topical wound management initiated : 11/24/2021 Notes: Electronic Signature(s) Signed: 11/09/2022 4:01:24 PM By: Zenaida Deed RN, BSN Entered By: Zenaida Deed on 11/09/2022 13:40:42 -------------------------------------------------------------------------------- Pain Assessment Details Patient Name: Date of Service: Andrea [name] 11/09/2022 1:15 PM Medical Record Number: [id number] Patient Account Number: [id number] Andrea [name], Andrea [name] ([id number]) [id number].pdf Page 5 of 10 Date of Birth/Sex: Treating RN: 1940-10-07 (82 y.o. F) Primary Care Adelyn Roscher: Other Clinician: Jarome Matin Referring Lynnix Schoneman: Treating Neftali Thurow/Extender: Marena Chancy in Treatment: 50 Active Problems Location of Pain Severity and Description of Pain Patient Has Paino No Site Locations Rate the pain. Current Pain Level: 0 Pain Management and Medication Current Pain Management: Electronic Signature(s) Signed: 11/09/2022 4:01:24 PM By: Zenaida Deed RN, BSN Entered By: Zenaida Deed on 11/09/2022 13:30:26 -------------------------------------------------------------------------------- Patient/Caregiver Education Details Patient Name: Date of Service: Andrea [name] 10/15/2024andnbsp1:15 PM Medical Record Number: [id number] Patient Account Number: [id number] Date of Birth/Gender: Treating RN: 12-01-1940 (81 y.o. [name] Primary Care Physician: Jarome Matin Other Clinician: Referring Physician: Treating Physician/Extender: Marena Chancy in Treatment: 50 Education Assessment Education Provided To: Patient Education Topics Provided Pressure: Methods: Explain/Verbal Responses: Reinforcements needed, State content correctly Wound/Skin Impairment: Methods: Explain/Verbal Responses: Reinforcements needed, State content  correctly Electronic Signature(s) Signed: 11/09/2022 4:01:24 PM By: Zenaida Deed RN, BSN Entered By: Zenaida Deed on 11/09/2022 13:41:08 Andrea [name] ([id number]) [id number] Page 6 of 10 -------------------------------------------------------------------------------- Wound Assessment Details Patient Name: Date of Service: Andrea [name] 11/09/2022 1:15 PM Medical Record Number: [id number] Patient Account Number: [id number] Date of Birth/Sex: Treating RN: 1940/11/10 (82 y.o. Andrea [name], Andrea [name] Primary Care Shoshanah Dapper: Jarome Matin Other Clinician: Referring Jolie Strohecker: Treating Khalaya Mcgurn/Extender: Marena Chancy in Treatment: 50 Wound Status Wound Number: 1 Primary Pressure Ulcer Etiology: Wound Location: Right, Medial Malleolus Wound Open Wounding Event: Pressure Injury Status: Date Acquired: 11/16/2021 Comorbid Cataracts, Arrhythmia, Hypertension, Osteoarthritis, Weeks Of Treatment: 50 History: Osteomyelitis, Neuropathy, Confinement Anxiety Clustered Wound: No Photos Wound Measurements Length: (cm) 0 Width: (cm) 0 Depth: (cm) 0 Area: (cm) Volume: (cm) .7 % Reduction in Area: 85.7% .4 % Reduction in Volume: 85.7% .1 Epithelialization: Small (1-33%) 0.22 Tunneling: No 0.022 Undermining: No Wound Description Classification: Category/Stage III Wound Margin: Distinct, outline attached Exudate Amount: Medium Exudate Type: Serosanguineous Exudate Color: red, brown Foul Odor After Cleansing: No Slough/Fibrino Yes Wound Bed Granulation Amount: Large (67-100%) Exposed Structure Granulation Quality: Red, Pink Fascia Exposed: No Necrotic Amount: Small (1-33%) Fat Layer (Subcutaneous Tissue) Exposed: Yes Tendon Exposed: No Muscle Exposed: No Joint Exposed: No Bone Exposed: No Periwound Skin Texture Texture Color No Abnormalities Noted: Yes No Abnormalities Noted: No Erythema: No Moisture No Abnormalities  Noted: Yes Temperature / Pain Temperature: No Abnormality Treatment Notes Wound #1 (Malleolus) Wound Laterality: Right, Medial Andrea [name], Andrea [name] ([id number]) [id number] Page 7 of 10 Cleanser Normal Saline Discharge Instruction: Cleanse the wound with Normal Saline prior to applying [name] clean dressing using gauze sponges, not tissue or cotton balls. Andrea-Wound Care Topical Primary Dressing Promogran Prisma Matrix, 4.34 (sq in) (silver collagen) Discharge Instruction: Moisten collagen with saline or hydrogel Secondary Dressing Optifoam Non-Adhesive Dressing, 4x4 in Discharge Instruction: Apply over primary dressing cut to make [name] foam donut Woven Gauze Sponges 2x2 in Discharge Instruction: Apply over primary dressing  as directed. Secured With [employer], 4.5x3.1 (in/yd) Discharge Instruction: Secure with Kerlix as directed. 87M Medipore H Soft Cloth Surgical T ape, 4 x 10 (in/yd) Discharge Instruction: Secure with tape as directed. Compression Wrap Compression Stockings Add-Ons Electronic Signature(s) Signed: 11/09/2022 4:01:24 PM By: Zenaida Deed RN, BSN Entered By: Zenaida Deed on 11/09/2022 13:42:30 -------------------------------------------------------------------------------- Wound Assessment Details Patient Name: Date of Service: Andrea [name] 11/09/2022 1:15 PM Medical Record Number: [id number] Patient Account Number: [id number] Date of Birth/Sex: Treating RN: Dec 03, 1940 (81 y.o. [name] Primary Care Keeley Sussman: Jarome Matin Other Clinician: Referring Ajanae Virag: Treating Azahel Belcastro/Extender: Marena Chancy in Treatment: 50 Wound Status Wound Number: 2 Primary Abscess Etiology: Wound Location: Right Gluteus Wound Open Wounding Event: Bump Status: Date Acquired: 09/28/2021 Comorbid Cataracts, Arrhythmia, Hypertension, Osteoarthritis, Weeks Of Treatment: 50 History: Osteomyelitis, Neuropathy,  Confinement Anxiety Clustered Wound: No Photos Wound Measurements Andrea [name], Andrea [name] ([id number]) Length: (cm) 0.7 Width: (cm) 0.7 Depth: (cm) 4 Area: (cm) 0.385 Volume: (cm) 1.539 [id number] Page 8 of 10 % Reduction in Area: 96.3% % Reduction in Volume: 95.1% Epithelialization: Medium (34-66%) Tunneling: No Undermining: No Wound Description Classification: Full Thickness With Exposed Suppo Wound Margin: Epibole Exudate Amount: Medium Exudate Type: Serous Exudate Color: amber rt Structures Foul Odor After Cleansing: No Slough/Fibrino Yes Wound Bed Granulation Amount: Large (67-100%) Exposed Structure Granulation Quality: Red Fascia Exposed: No Necrotic Amount: None Present (0%) Fat Layer (Subcutaneous Tissue) Exposed: Yes Tendon Exposed: No Muscle Exposed: No Joint Exposed: No Bone Exposed: No Periwound Skin Texture Texture Color No Abnormalities Noted: Yes No Abnormalities Noted: Yes Moisture Temperature / Pain No Abnormalities Noted: Yes Temperature: No Abnormality Tenderness on Palpation: Yes Treatment Notes Wound #2 (Gluteus) Wound Laterality: Right Cleanser Andrea-Wound Care Topical Primary Dressing Dakin's Solution 0.25%, 16 (oz) Discharge Instruction: Moisten gauze with Dakin's solution Promogran Prisma Matrix, 4.34 (sq in) (silver collagen) Discharge Instruction: Moisten collagen with saline or hydrogel Secondary Dressing Zetuvit Plus Silicone Border Dressing 4x4 (in/in) Discharge Instruction: Apply silicone border over primary dressing as directed. Secured With Compression Wrap Compression Stockings Add-Ons Electronic Signature(s) Signed: 11/09/2022 2:37:24 PM By: Dayton Scrape Signed: 11/09/2022 4:01:24 PM By: Zenaida Deed RN, BSN Entered By: [place] Scrape on 11/09/2022 13:40:52 -------------------------------------------------------------------------------- Wound Assessment Details Patient Name: Date of Service: Andrea [name] 11/09/2022 1:15 PM Medical Record Number: [id number] Patient Account Number: [id number] Date of Birth/Sex: Treating RN: 1940-11-02 (81 y.o. [name] Primary Care Mardelle Pandolfi: Jarome Matin Other Clinician: Peri [name] ([id number]) [id number] Page 9 of 10 Referring Javeria Briski: Treating Anahi Belmar/Extender: Marena Chancy in Treatment: 50 Wound Status Wound Number: 4 Primary Pressure Ulcer Etiology: Wound Location: Left, Lateral Ankle Wound Open Wounding Event: Pressure Injury Status: Date Acquired: 12/23/2021 Comorbid Cataracts, Arrhythmia, Hypertension, Osteoarthritis, Weeks Of Treatment: 44 History: Osteomyelitis, Neuropathy, Confinement Anxiety Clustered Wound: No Photos Wound Measurements Length: (cm) 0.8 Width: (cm) 0.6 Depth: (cm) 0.1 Area: (cm) 0.377 Volume: (cm) 0.038 % Reduction in Area: -167.4% % Reduction in Volume: -171.4% Epithelialization: Small (1-33%) Tunneling: No Undermining: No Wound Description Classification: Category/Stage IV Wound Margin: Distinct, outline attached Exudate Amount: Medium Exudate Type: Serous Exudate Color: amber Foul Odor After Cleansing: No Slough/Fibrino Yes Wound Bed Granulation Amount: Medium (34-66%) Exposed Structure Granulation Quality: Pink Fascia Exposed: No Necrotic Amount: Medium (34-66%) Fat Layer (Subcutaneous Tissue) Exposed: Yes Tendon Exposed: No Muscle Exposed: No Joint Exposed: No Bone Exposed: No Periwound Skin Texture Texture Color No Abnormalities Noted: Yes No Abnormalities Noted: No Erythema:  No Moisture Rubor: No No Abnormalities Noted: Yes Temperature / Pain Temperature: No Abnormality Tenderness on Palpation: Yes Treatment Notes Wound #4 (Ankle) Wound Laterality: Left, Lateral Cleanser Normal Saline Discharge Instruction: Cleanse the wound with Normal Saline prior to applying [name] clean dressing using gauze sponges, not  tissue or cotton balls. Andrea-Wound Care Topical Primary Dressing Promogran Prisma Matrix, 4.34 (sq in) (silver collagen) Discharge Instruction: Moisten collagen with saline or hydrogel Andrea [name], Andrea [name] ([id number]) [phone].pdf Page 10 of 10 Secondary Dressing Optifoam Non-Adhesive Dressing, 4x4 in Discharge Instruction: Apply over primary dressing cut to make [name] foam donut Woven Gauze Sponges 2x2 in Discharge Instruction: Apply over primary dressing as directed. Secured With [employer], 4.5x3.1 (in/yd) Discharge Instruction: Secure with Kerlix as directed. 45M Medipore H Soft Cloth Surgical T ape, 4 x 10 (in/yd) Discharge Instruction: Secure with tape as directed. Compression Wrap Compression Stockings Add-Ons Electronic Signature(s) Signed: 11/09/2022 4:01:24 PM By: Zenaida Deed RN, BSN Entered By: Zenaida Deed on 11/09/2022 13:42:30 -------------------------------------------------------------------------------- Vitals Details Patient Name: Date of Service: Andrea [name] 11/09/2022 1:15 PM Medical Record Number: [id number] Patient Account Number: [id number] Date of Birth/Sex: Treating RN: 1940/05/15 (81 y.o. F) Primary Care Lamont Glasscock: Jarome Matin Other Clinician: Referring Shabnam Ladd: Treating Calloway Andrus/Extender: Marena Chancy in Treatment: 50 Vital Signs Time Taken: 01:24 Temperature (F): 97.9 Height (in): 59 Pulse (bpm): 76 Weight (lbs): 155 Respiratory Rate (breaths/min): 18 Body Mass Index (BMI): 31.3 Blood Pressure (mmHg): 127/58 Reference Range: 80 - 120 mg / dl Electronic Signature(s) Signed: 11/09/2022 4:01:24 PM By: Zenaida Deed RN, BSN Entered By: Zenaida Deed on 11/09/2022 13:30:00

## 2022-11-16 ENCOUNTER — Ambulatory Visit (HOSPITAL_BASED_OUTPATIENT_CLINIC_OR_DEPARTMENT_OTHER): Payer: Medicare Other | Admitting: General Surgery

## 2022-11-23 ENCOUNTER — Ambulatory Visit (HOSPITAL_BASED_OUTPATIENT_CLINIC_OR_DEPARTMENT_OTHER): Payer: Medicare Other | Admitting: General Surgery

## 2022-11-29 ENCOUNTER — Encounter (HOSPITAL_BASED_OUTPATIENT_CLINIC_OR_DEPARTMENT_OTHER): Payer: Medicare Other | Attending: General Surgery | Admitting: General Surgery

## 2022-11-29 DIAGNOSIS — K219 Gastro-esophageal reflux disease without esophagitis: Secondary | ICD-10-CM | POA: Diagnosis not present

## 2022-11-29 DIAGNOSIS — I4891 Unspecified atrial fibrillation: Secondary | ICD-10-CM | POA: Insufficient documentation

## 2022-11-29 DIAGNOSIS — L89523 Pressure ulcer of left ankle, stage 3: Secondary | ICD-10-CM | POA: Diagnosis not present

## 2022-11-29 DIAGNOSIS — L0231 Cutaneous abscess of buttock: Secondary | ICD-10-CM | POA: Diagnosis not present

## 2022-11-29 DIAGNOSIS — E2740 Unspecified adrenocortical insufficiency: Secondary | ICD-10-CM | POA: Insufficient documentation

## 2022-11-29 DIAGNOSIS — L89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, stage 3: Secondary | ICD-10-CM | POA: Insufficient documentation

## 2022-11-29 DIAGNOSIS — I10 Essential (primary) hypertension: Secondary | ICD-10-CM | POA: Insufficient documentation

## 2022-11-29 DIAGNOSIS — Z87891 Personal history of nicotine dependence: Secondary | ICD-10-CM | POA: Insufficient documentation

## 2022-11-29 DIAGNOSIS — Z7952 Long term (current) use of systemic steroids: Secondary | ICD-10-CM | POA: Diagnosis not present

## 2022-11-29 DIAGNOSIS — L98415 Non-pressure chronic ulcer of buttock with muscle involvement without evidence of necrosis: Secondary | ICD-10-CM | POA: Diagnosis not present

## 2022-11-29 DIAGNOSIS — Z6831 Body mass index (BMI) 31.0-31.9, adult: Secondary | ICD-10-CM | POA: Insufficient documentation

## 2022-11-29 NOTE — Progress Notes (Signed)
FIDELIS, LOTH [name] ([id number]) [id number] Page 1 of 13 Visit Report for 11/29/2022 Chief Complaint Document Details Patient Name: Date of Service: Andrea [name] 11/29/2022 2:15 PM Medical Record Number: [id number] Patient Account Number: [id number] Date of Birth/Sex: Treating RN: [birth year] (82 y.o. F) Primary Care Provider: Jarome [name] Other Clinician: Referring Provider: Treating Provider/Extender: Marena Chancy in Treatment: 52 Information Obtained from: Patient Chief Complaint Patient is at the clinic for treatment of an open pressure ulcer on her right medial ankle, and [name] large abscess on her right buttock Electronic Signature(s) Signed: 11/29/2022 2:46:40 PM By: Duanne Guess MD FACS Entered By: Duanne Guess on 11/29/2022 11:46:40 -------------------------------------------------------------------------------- Debridement Details Patient Name: Date of Service: Andrea [name] 11/29/2022 2:15 PM Medical Record Number: [id number] Patient Account Number: [id number] Date of Birth/Sex: Treating RN: [birth year] (81 y.o. Andrea [name] Primary Care Provider: Jarome [name] Other Clinician: Referring Provider: Treating Provider/Extender: Marena Chancy in Treatment: 52 Debridement Performed for Assessment: Wound #4 Left,Lateral Ankle Performed By: Physician Duanne Guess, MD The following information was scribed by: Samuella Bruin The information was scribed for: Duanne Guess Debridement Type: Debridement Level of Consciousness (Pre-procedure): Awake and Alert Pre-procedure Verification/Time Out Yes - 14:40 Taken: Start Time: 14:40 Pain Control: Lidocaine 4% T opical Solution Percent of Wound Bed Debrided: 100% T Area Debrided (cm): otal 0.2 Tissue and other material debrided: Non-Viable, Slough, Subcutaneous, Slough Level: Skin/Subcutaneous Tissue Debridement  Description: Excisional Instrument: Curette Bleeding: Minimum Hemostasis Achieved: Pressure Response to Treatment: Procedure was tolerated well Level of Consciousness (Post- Awake and Alert procedure): Post Debridement Measurements of Total Wound Length: (cm) 0.5 Stage: Category/Stage IV Width: (cm) 0.5 Depth: (cm) 0.2 Volume: (cm) 0.039 Character of Wound/Ulcer Post Debridement: Improved Post Procedure Diagnosis Andrea [name], Andrea [name] ([id number]) [id number] Page 2 of 13 Same as Pre-procedure Electronic Signature(s) Signed: 11/29/2022 3:08:08 PM By: Duanne Guess MD FACS Signed: 11/29/2022 4:14:29 PM By: Samuella Bruin Entered By: Samuella Bruin on 11/29/2022 11:41:03 -------------------------------------------------------------------------------- Debridement Details Patient Name: Date of Service: Andrea [name] 11/29/2022 2:15 PM Medical Record Number: [id number] Patient Account Number: [id number] Date of Birth/Sex: Treating RN: 07/12/1940 (81 y.o. Andrea [name] Primary Care Provider: Jarome [name] Other Clinician: Referring Provider: Treating Provider/Extender: Marena Chancy in Treatment: 52 Debridement Performed for Assessment: Wound #1 Right,Medial Malleolus Performed By: Physician Duanne Guess, MD The following information was scribed by: Samuella Bruin The information was scribed for: Duanne Guess Debridement Type: Debridement Level of Consciousness (Pre-procedure): Awake and Alert Pre-procedure Verification/Time Out Yes - 14:40 Taken: Start Time: 14:40 Pain Control: Lidocaine 4% T opical Solution Percent of Wound Bed Debrided: 100% T Area Debrided (cm): otal 0.13 Tissue and other material debrided: Non-Viable, Slough, Subcutaneous, Skin: Epidermis, Slough Level: Skin/Subcutaneous Tissue Debridement Description: Excisional Instrument: Curette Bleeding: Minimum Hemostasis Achieved:  Pressure Response to Treatment: Procedure was tolerated well Level of Consciousness (Post- Awake and Alert procedure): Post Debridement Measurements of Total Wound Length: (cm) 0.4 Stage: Category/Stage III Width: (cm) 0.4 Depth: (cm) 0.1 Volume: (cm) 0.013 Character of Wound/Ulcer Post Debridement: Improved Post Procedure Diagnosis Same as Pre-procedure Electronic Signature(s) Signed: 11/29/2022 3:08:08 PM By: Duanne Guess MD FACS Signed: 11/29/2022 4:14:29 PM By: Samuella Bruin Entered By: Samuella Bruin on 11/29/2022 11:42:45 -------------------------------------------------------------------------------- HPI Details Patient Name: Date of Service: Andrea [name]. 11/29/2022 2:15 PM Medical Record Number: [id number] Patient Account Number: [id number] Date of Birth/Sex: Treating RN: May 12, 1940 (81 y.o. 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 She is not diabetic and quit smoking over 40 years ago. 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 She has developed [name] pressure ulcer on the right medial malleolus. In addition, she apparently has had multiple cutaneous abscesses on her buttocks that have required repeated incision and drainage procedures. She has developed [name] large abscess on her right buttock that has become painful. She was recently treated with cephalexin by her PCP for urinary tract infection and also received [name] shot of Rocephin for the abscess, but it has not yet been incised or drained. She is nonambulatory but is able to  stand to transfer. She does not wear any sort of Prevalon boot. ABI in clinic today was 0.96. On her right medial malleolus, there is [name] circular ulcer. The surface is dry and fibrotic. There is some periwound erythema, but no malodor or purulent drainage. On her right buttock, there is [name] large fluctuant bulge about the size of an orange. It is also erythematous and tender. 12/04/2021: The culture from her abscess grew out staph. [name] 10-day course of Bactrim was prescribed and she is currently taking this. Unfortunately, Dakin's solution was not delivered and only 2 x 2 gauze was sent, rather than Kerlix so the packing of the wound has been rather suboptimal. The wound cavity has light slough over all of the surfaces. Although covered, bone is palpable. The medial ankle wound looks about the same with [name] layer of slough accumulation. In addition, her daughter peeled some dry skin off of her foot this morning and she now has denuded areas over the majority of the plantar surface of her right foot. 01/01/2022: The patient has missed several visits and to her daughters report that it is somewhat difficult to get her here on [name] weekly basis. In the interim, she has developed [name] new pressure ulcer on her left lateral malleolus. The plantar surface of her right foot has healed. The cavity on her right buttock has contracted, but it remains quite deep and the trochanter, while not exposed, is palpable at the base. The pressure ulcer on her right medial malleolus is basically unchanged. 02/09/2022: All wounds are little bit smaller today. As the buttock abscess cavity has contracted, it has left some tunneling that has not been adequately packed. Light slough accumulation on both ankle wounds. 02/26/2022: All of the wounds continue to contract. There is slough on both ankle wounds. The buttock ulcer is clean. She brought her wound VAC with her today. 03/12/2022: The ankle wounds are about the same this week with slough  accumulation. The buttock ulcer is smaller and quite clean. She decided that she could not tolerate the discomfort of the wound VAC and removed it. They have been packing the wound with Dakin's moistened gauze. 03/25/2022: The ankle wounds are slightly smaller. Both have slough accumulation. The orifice of the buttock ulcer has gotten quite [name] bit smaller than the underlying cavity. She has decided that she would like to have the wound VAC back. 3/14; patient presents for follow-up. She has bilateral ankle wounds and has been using Hydrofera Blue to the these wound beds. She has Prevalon boots to help  with offloading. She also has [name] sacral wound that we have been using [name] wound VAC for. She has no issues or complaints today. 04/29/2022: She once again decided she did not like the wound VAC and so they have been packing her gluteal wound with Dakin's moistened gauze. The gluteal ulcer seems to be about the same. Her left lateral ankle wound has gotten deeper and larger. The surface tissue is gray with thick slough. No significant change to the right medial ankle wound. 05/18/2022: The depth on the gluteal ulcer has come in [name] bit. There was [name] very strong pungent odor when the packing was removed, however. The right medial ankle wound is about the same size, but the tissue surface looks healthier. The left lateral ankle wound has reaccumulated the rubbery gray slough. 05/31/2022: The gluteal ulcer is shallower and no longer has any dips or crevices. The odor has abated completely. The right medial ankle wound is unchanged. The left lateral ankle wound looks quite [name] bit healthier with [name] cleaner surface and the beginnings of granulation tissue emergence. 06/16/2022: The gluteal ulcer has the same measurements more or less, but overall the volume seems smaller, as the cavity feels tighter on examination. The ankle wounds are basically the same. 07/12/2022: The cavity of the buttock ulcer continues to contract. The  left ankle wound has [name] little bit more granulation tissue, but bone remains exposed. The right ankle wound is flush with the surrounding skin. 07/21/2022: There is 1 deeper area in the buttock ulcer that I have not appreciated previously. I can feel bone under [name] layer of tissue. The rest of the cavity has contracted. Both ankle wounds are filling in with better granulation tissue. There is slough on both ankle wound surfaces. She struck her leg on her bed frame and has 2 new wounds on her right anterior tibial surface. Both have fat layer exposure but no concern for infection. 08/02/2022: I do not feel bone as easily in the buttock ulcer. There is slough on the surface. The cavity seems to be contracting. Both ankle wounds have improved tissue quality and there is granulation tissue nearly completely covering the bone on the left lateral malleolus. The wounds on her right anterior tibial surface have dried up. 08/10/2022: The cavity at the buttock ulcer has contracted further. The depth remains about the same. The surface is cleaner this week. Both ankle wounds are little bit smaller today. They have rubbery slough on the surface which appears to be leftover Oasis. Underneath, the quality of the tissue continues to improve and there is no longer any bone exposed on the left lateral malleolus. 08/16/2022: The buttock ulcer cavity has contracted further and she has less tenderness at the deepest aspect of the wound. Both ankle wounds look smaller today, although they did not measure any differently. Minimal slough accumulation with fairly healthy-looking granulation tissue present. 08/24/2022: The buttock ulcer cavity is smaller again today. She no longer has the tenderness at the deepest aspect of the wound. Both ankle wounds measured about [name] millimeter smaller. There is healthy-looking granulation tissue present bilaterally with minimal slough on the left and no slough seen on the right. 08/31/2022: The  cavity of the buttock ulcer continues to contract. It is about half [name] centimeter shallower than last week and there is no undermining. The cavity is also narrower and is more difficult to get an index finger into the wound. Both ankle wounds measured slightly smaller. Both have [name] rather dry skin edge  but good granulation tissue present with minimal slough. 09/08/2022: The buttock ulcer cavity is smaller again today. It is shallower and narrower. Both ankle wounds were [name] little bit smaller, as well. There is [name] fair amount of slough on the left lateral ankle wound. 09/14/2022: Unfortunately, the buttock ulcer cavity measured [name] little larger and [name] little deeper today. The patient did say she has been sitting [name] bit more of late. The right medial ankle wound is about the same size; the left lateral ankle wound is [name] little smaller and shallower. There is an area on the medial aspect of her right calcaneus that looks concerning for potential pressure-induced deep tissue injury. 09/29/2022: Both ankle wounds measured smaller today. Underneath the dried-on Oasis and slough, there is actually fairly healthy-looking granulation tissue filling in. The gluteal ulcer is narrower and tighter, but the depth has not really decreased at all. Andrea [name], Andrea [name] ([id number]) [id number] Page 4 of 13 10/05/2022: Once again, both ankle wounds measured slightly smaller. The tissue surfaces look healthy. The gluteal ulcer continues to contract circumferentially, but the depth remains stable. 10/12/2022: The depth on the gluteal ulcer came in by about half [name] centimeter this week. Both ankles look about the same with rubbery slough overlying granulation tissue. The granulation tissue is not quite as pink and robust-appearing as on previous occasions. 10/28/2022: The gluteal ulcer is shallower again this week without any tunneling. It continues to contract in all dimensions and the surface is quite clean.  Both ankle wounds are little bit smaller today. They have slough on the surfaces, but the underlying granulation tissue is healthier-looking. 11/09/2022: The ankle wounds continue to contract and fill with granulation tissue. They are quite clean with minimal slough on the surfaces. The gluteal ulcer has skin growing down the sides of the orifice. It measured shallower today. It is clean. 11/29/2022: Both ankle wounds are slightly deeper today, but the overall diameter of each seems to be smaller. The quality of the tissue on the wound surface is quite good. There is slough and eschar on each of these. Her buttock ulcer continues to contract. The depth has come in by about half. Skin continues to grow down the sides of the cavity. No malodor or purulent drainage. Electronic Signature(s) Signed: 11/29/2022 2:48:15 PM By: Duanne Guess MD FACS Entered By: Duanne Guess on 11/29/2022 11:48:15 -------------------------------------------------------------------------------- Physical Exam Details Patient Name: Date of Service: Andrea [name] 11/29/2022 2:15 PM Medical Record Number: [id number] Patient Account Number: [id number] Date of Birth/Sex: Treating RN: 10/30/1940 (81 y.o. F) Primary Care Provider: Jarome [name] Other Clinician: Referring Provider: Treating Provider/Extender: Marena Chancy in Treatment: 52 Constitutional Slightly hypertensive. . . . no acute distress. Respiratory Normal work of breathing on room air.. Notes 11/29/2022: Both ankle wounds are slightly deeper today, but the overall diameter of each seems to be smaller. The quality of the tissue on the wound surface is quite good. There is slough and eschar on each of these. Her buttock ulcer continues to contract. The depth has come in by about half. Skin continues to grow down the sides of the cavity. No malodor or purulent drainage. Electronic Signature(s) Signed: 11/29/2022 2:49:09 PM  By: Duanne Guess MD FACS Entered By: Duanne Guess on 11/29/2022 11:49:09 -------------------------------------------------------------------------------- Physician Orders Details Patient Name: Date of Service: Andrea [name] 11/29/2022 2:15 PM Medical Record Number: [id number] Patient Account Number: [id number] Date of Birth/Sex: Treating RN: 02-05-1940 (81 y.o. 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 - Dr. Lady Gary rm 1 Return [name] Anesthetic (In clinic) Topical Lidocaine 4% applied to wound bed Bathing/ Shower/ Hygiene May shower and wash wound with soap and water. - with dressing changes Off-Loading Multipodus Splint to: - prevalon boot to both feet Turn and reposition every 2 hours Home Health No change in wound care orders this week; continue Home Health for wound care. May utilize formulary equivalent dressing for wound treatment orders unless otherwise specified. Dressing changes to be completed by Home Health on Monday / Wednesday / Friday except when patient has scheduled visit at [facility]. Other Home Health Orders/Instructions: - [facility] Wound  Treatment Wound #1 - Malleolus Wound Laterality: Right, Medial Cleanser: Normal Saline (Generic) 3 x Per Week/30 Days Discharge Instructions: Cleanse the wound with Normal Saline prior to applying [name] clean dressing using gauze sponges, not tissue or cotton balls. Prim Dressing: Promogran Prisma Matrix, 4.34 (sq in) (silver collagen) (Dispense As Written) 3 x Per Week/30 Days ary Discharge Instructions: Moisten collagen with saline or hydrogel Secondary Dressing: Optifoam Non-Adhesive Dressing, 4x4 in (Generic) 3 x Per Week/30 Days Discharge Instructions: Apply over primary dressing cut to make [name] foam donut Secondary Dressing: Woven Gauze Sponges 2x2 in (Generic) 3 x Per Week/30 Days Discharge Instructions: Apply over primary dressing as directed. Secured With: [employer], 4.5x3.1 (in/yd) 3 x Per Week/30 Days Discharge Instructions: Secure with Kerlix as directed. Secured With: 102M Medipore H Soft Cloth Surgical T ape, 4 x 10 (in/yd) (Generic) 3 x Per Week/30 Days Discharge Instructions: Secure with tape as directed. Wound #2 - Gluteus Wound Laterality: Right Prim Dressing: Dakin's Solution 0.25%, 16 (oz) 1 x Per Day/30 Days ary Discharge Instructions: Moisten gauze with Dakin's solution Prim Dressing: Promogran Prisma Matrix, 4.34 (sq in) (silver collagen) (Dispense As Written) 1 x Per Day/30 Days ary Discharge Instructions: Moisten collagen with saline or hydrogel Secondary Dressing: Zetuvit Plus Silicone Border Dressing 4x4 (in/in) (Dispense As Written) 1 x Per Day/30 Days Discharge Instructions: Apply silicone border over primary dressing as directed. Wound #4 - Ankle Wound Laterality: Left, Lateral Cleanser: Normal Saline (Generic) 3 x Per Week/30 Days Discharge Instructions: Cleanse the wound with Normal Saline prior to applying [name] clean dressing using gauze sponges, not tissue or cotton balls. Prim Dressing: Promogran Prisma Matrix, 4.34 (sq in) (silver collagen) (Dispense As  Written) 3 x Per Week/30 Days ary Discharge Instructions: Moisten collagen with saline or hydrogel Secondary Dressing: Optifoam Non-Adhesive Dressing, 4x4 in (Generic) 3 x Per Week/30 Days Discharge Instructions: Apply over primary dressing cut to make [name] foam donut Secondary Dressing: Woven Gauze Sponges 2x2 in (Generic) 3 x Per Week/30 Days Discharge Instructions: Apply over primary dressing as directed. Secured With: [employer], 4.5x3.1 (in/yd) 3 x Per Week/30 Days Discharge Instructions: Secure with Kerlix as directed. Andrea [name], Andrea [name] ([id number]) [id number] Page 6 of 13 Secured With:  86M Medipore H Soft Cloth Surgical T ape, 4 x 10 (in/yd) (Generic) 3 x Per Week/30 Days Discharge Instructions: Secure with tape as directed. Patient Medications llergies: penicillin, latex, Prolia, ceftriaxone, ciprofloxacin, Fosamax, lisinopril, tuberculin,PPD,multi-puncture [name] Notifications Medication Indication Start End 11/29/2022 lidocaine DOSE topical 4 % cream - cream topical Electronic Signature(s) Signed: 11/29/2022 3:08:08 PM By: Duanne Guess MD FACS Entered By: Duanne Guess on 11/29/2022 11:50:08 -------------------------------------------------------------------------------- Problem List Details Patient Name: Date of Service: Andrea [name] 11/29/2022 2:15 PM Medical Record Number: [id number] Patient Account Number: [id number] Date of Birth/Sex: Treating RN: [birth year] (81 y.o. 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 without  01/01/2022 No Yes evidence of necrosis L02.31 Cutaneous abscess of buttock 11/24/2021 No Yes E27.40 Unspecified adrenocortical insufficiency 11/24/2021 No Yes Z79.52 Long term (current) use of systemic steroids 11/24/2021 No Yes I10 Essential (primary) hypertension 11/24/2021 No Yes Inactive Problems ICD-10 Code Description Active Date Inactive Date L97.812 Non-pressure chronic ulcer of other part of right lower leg with fat layer exposed 07/21/2022 07/21/2022 JAMEAH, Andrea [name] ([id number]) [phone].pdf Page 7 of 13 Resolved Problems ICD-10 Code Description Active Date Resolved Date L97.511 Non-pressure chronic ulcer of other part of right foot limited to breakdown of skin 12/04/2021 12/04/2021 Electronic Signature(s) Signed: 11/29/2022 2:46:16 PM By: Duanne Guess MD FACS Previous Signature: 11/29/2022 2:37:14 PM Version By: Duanne Guess MD FACS Entered By: Duanne Guess on 11/29/2022 11:46:16 -------------------------------------------------------------------------------- Progress Note Details Patient Name: Date of Service: Andrea [name] 11/29/2022 2:15 PM Medical Record Number: [id number] Patient Account Number: [id number] Date of Birth/Sex: Treating RN: 07-Feb-1940 (82 y.o. 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 She is not diabetic and quit smoking over 40 years ago. 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 As result, she has an ankle  deformity. She has developed [name] pressure ulcer on the right medial malleolus. In addition, she apparently has had multiple cutaneous abscesses on her buttocks that have required repeated incision and drainage procedures. She has developed [name] large abscess on her right buttock that has become painful. She was recently treated with cephalexin by her PCP for urinary tract infection and also received [name] shot of Rocephin for the abscess, but it has not yet been incised or drained. She is nonambulatory but is able to stand to transfer. She does not wear any sort of Prevalon boot. ABI in clinic today was 0.96. On her right medial malleolus, there is [name] circular ulcer. The surface is dry and fibrotic. There is some periwound erythema, but no malodor or purulent drainage. On her right buttock, there is [name] large fluctuant bulge about the size of an orange. It is also erythematous and tender. 12/04/2021: The culture from her abscess grew out staph. [name] 10-day course  of Bactrim was prescribed and she is currently taking this. Unfortunately, Dakin's solution was not delivered and only 2 x 2 gauze was sent, rather than Kerlix so the packing of the wound has been rather suboptimal. The wound cavity has light slough over all of the surfaces. Although covered, bone is palpable. The medial ankle wound looks about the same with [name] layer of slough accumulation. In addition, her daughter peeled some dry skin off of her foot this morning and she now has denuded areas over the majority of the plantar surface of her right foot. 01/01/2022: The patient has missed several visits and to her daughters report that it is somewhat difficult to get her here on [name] weekly basis. In the interim, she has developed [name] new pressure ulcer on her left lateral malleolus. The plantar surface of her right foot has healed. The cavity on her right buttock has contracted, but it remains quite deep and the trochanter, while not exposed, is palpable at the  base. The pressure ulcer on her right medial malleolus is basically unchanged. 02/09/2022: All wounds are little bit smaller today. As the buttock abscess cavity has contracted, it has left some tunneling that has not been adequately packed. Light slough accumulation on both ankle wounds. 02/26/2022: All of the wounds continue to contract. There is slough on both ankle wounds. The buttock ulcer is clean. She brought her wound VAC with her today. 03/12/2022: The ankle wounds are about the same this week with slough accumulation. The buttock ulcer is smaller and quite clean. She decided that she could not tolerate the discomfort of the wound VAC and removed it. They have been packing the wound with Dakin's moistened gauze. 03/25/2022: The ankle wounds are slightly smaller. Both have slough accumulation. The orifice of the buttock ulcer has gotten quite [name] bit smaller than the underlying cavity. She has decided that she would like to have the wound VAC back. 3/14; patient presents for follow-up. She has bilateral ankle wounds and has been using Hydrofera Blue to the these wound beds. She has Prevalon boots to help with offloading. She also has [name] sacral wound that we have been using [name] wound VAC for. She has no issues or complaints today. 04/29/2022: She once again decided she did not like the wound VAC and so they have been packing her gluteal wound with Dakin's moistened gauze. The gluteal ulcer seems to be about the same. Her left lateral ankle wound has gotten deeper and larger. The surface tissue is gray with thick slough. No significant change to the right medial ankle wound. 05/18/2022: The depth on the gluteal ulcer has come in [name] bit. There was [name] very strong pungent odor when the packing was removed, however. The right medial Andrea [name], Andrea [name] ([id number]) [id number] Page 8 of 13 ankle wound is about the same size, but the tissue surface looks healthier. The left lateral ankle  wound has reaccumulated the rubbery gray slough. 05/31/2022: The gluteal ulcer is shallower and no longer has any dips or crevices. The odor has abated completely. The right medial ankle wound is unchanged. The left lateral ankle wound looks quite [name] bit healthier with [name] cleaner surface and the beginnings of granulation tissue emergence. 06/16/2022: The gluteal ulcer has the same measurements more or less, but overall the volume seems smaller, as the cavity feels tighter on examination. The ankle wounds are basically the same. 07/12/2022: The cavity of the buttock ulcer continues to contract. The left ankle wound has [name] little  bit more granulation tissue, but bone remains exposed. The right ankle wound is flush with the surrounding skin. 07/21/2022: There is 1 deeper area in the buttock ulcer that I have not appreciated previously. I can feel bone under [name] layer of tissue. The rest of the cavity has contracted. Both ankle wounds are filling in with better granulation tissue. There is slough on both ankle wound surfaces. She struck her leg on her bed frame and has 2 new wounds on her right anterior tibial surface. Both have fat layer exposure but no concern for infection. 08/02/2022: I do not feel bone as easily in the buttock ulcer. There is slough on the surface. The cavity seems to be contracting. Both ankle wounds have improved tissue quality and there is granulation tissue nearly completely covering the bone on the left lateral malleolus. The wounds on her right anterior tibial surface have dried up. 08/10/2022: The cavity at the buttock ulcer has contracted further. The depth remains about the same. The surface is cleaner this week. Both ankle wounds are little bit smaller today. They have rubbery slough on the surface which appears to be leftover Oasis. Underneath, the quality of the tissue continues to improve and there is no longer any bone exposed on the left lateral malleolus. 08/16/2022: The buttock  ulcer cavity has contracted further and she has less tenderness at the deepest aspect of the wound. Both ankle wounds look smaller today, although they did not measure any differently. Minimal slough accumulation with fairly healthy-looking granulation tissue present. 08/24/2022: The buttock ulcer cavity is smaller again today. She no longer has the tenderness at the deepest aspect of the wound. Both ankle wounds measured about [name] millimeter smaller. There is healthy-looking granulation tissue present bilaterally with minimal slough on the left and no slough seen on the right. 08/31/2022: The cavity of the buttock ulcer continues to contract. It is about half [name] centimeter shallower than last week and there is no undermining. The cavity is also narrower and is more difficult to get an index finger into the wound. Both ankle wounds measured slightly smaller. Both have [name] rather dry skin edge but good granulation tissue present with minimal slough. 09/08/2022: The buttock ulcer cavity is smaller again today. It is shallower and narrower. Both ankle wounds were [name] little bit smaller, as well. There is [name] fair amount of slough on the left lateral ankle wound. 09/14/2022: Unfortunately, the buttock ulcer cavity measured [name] little larger and [name] little deeper today. The patient did say she has been sitting [name] bit more of late. The right medial ankle wound is about the same size; the left lateral ankle wound is [name] little smaller and shallower. There is an area on the medial aspect of her right calcaneus that looks concerning for potential pressure-induced deep tissue injury. 09/29/2022: Both ankle wounds measured smaller today. Underneath the dried-on Oasis and slough, there is actually fairly healthy-looking granulation tissue filling in. The gluteal ulcer is narrower and tighter, but the depth has not really decreased at all. 10/05/2022: Once again, both ankle wounds measured slightly smaller. The tissue surfaces look  healthy. The gluteal ulcer continues to contract circumferentially, but the depth remains stable. 10/12/2022: The depth on the gluteal ulcer came in by about half [name] centimeter this week. Both ankles look about the same with rubbery slough overlying granulation tissue. The granulation tissue is not quite as pink and robust-appearing as on previous occasions. 10/28/2022: The gluteal ulcer is shallower again this week without any  tunneling. It continues to contract in all dimensions and the surface is quite clean. Both ankle wounds are little bit smaller today. They have slough on the surfaces, but the underlying granulation tissue is healthier-looking. 11/09/2022: The ankle wounds continue to contract and fill with granulation tissue. They are quite clean with minimal slough on the surfaces. The gluteal ulcer has skin growing down the sides of the orifice. It measured shallower today. It is clean. 11/29/2022: Both ankle wounds are slightly deeper today, but the overall diameter of each seems to be smaller. The quality of the tissue on the wound surface is quite good. There is slough and eschar on each of these. Her buttock ulcer continues to contract. The depth has come in by about half. Skin continues to grow down the sides of the cavity. No malodor or purulent drainage. Patient History Information obtained from Patient, Caregiver, Chart. Family History Cancer - Father,Maternal Grandparents,Siblings,Child, Heart Disease - Father, Hypertension - Father, Thyroid Problems - Mother, No family history of Diabetes, Hereditary Spherocytosis, Kidney Disease, Lung Disease, Seizures, Stroke, Tuberculosis. Social History Former smoker - quit 40 yr ago, Marital Status - Widowed, Alcohol Use - Never, Drug Use - No History, Caffeine Use - Daily - tea, soda. Medical History Eyes Patient has history of Cataracts - bil extracted Denies history of Glaucoma, Optic Neuritis Cardiovascular Patient has history of  Arrhythmia - afib, Hypertension Endocrine Denies history of Type I Diabetes, Type II Diabetes Genitourinary Denies history of End Stage Renal Disease Integumentary (Skin) Denies history of History of Burn Musculoskeletal Patient has history of Osteoarthritis, Osteomyelitis - pelvic Neurologic Patient has history of Neuropathy Oncologic Denies history of Received Chemotherapy, Received Radiation Psychiatric Patient has history of Confinement Anxiety Denies history of Andrea [name], Andrea [name] ([id number]) [id number] Page 9 of 13 Hospitalization/Surgery History - TEE. - right ankle fusion. - bil knee replacements. - bil cataract extractions. - posterior lumbar fusion. - vaginal hysterectomy. - cholecystectomy. 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 no acute distress. Vitals Time Taken: 2:23 AM, Height: 59 in, Weight: 155 lbs, BMI: 31.3, Temperature: 97.9 F, Pulse: 68 bpm, Respiratory Rate: 18 breaths/min, Blood Pressure: 147/62 mmHg. Respiratory Normal work of breathing on room air.. General Notes: 11/29/2022: Both ankle wounds are slightly deeper today, but the overall diameter of each seems to be smaller. The quality of the tissue on the wound surface is quite good. There is slough and eschar on each of these. Her buttock ulcer continues to contract. The depth has come in by about half. Skin continues to grow down the sides of the cavity. No malodor or purulent drainage. Integumentary (Hair, Skin) Wound #1 status is Open. Original cause of wound was Pressure Injury. The date acquired was: 11/16/2021. The wound has been in treatment 52 weeks.  The wound is located on the Right,Medial Malleolus. The wound measures 0.4cm length x 0.4cm width x 0.1cm depth; 0.126cm^2 area and 0.013cm^3 volume. There is Fat Layer (Subcutaneous Tissue) exposed. There is no tunneling or undermining noted. There is [name] medium amount of purulent drainage noted. The wound margin is distinct with the outline attached to the wound base. There is small (1-33%) red, pink granulation within the wound bed. There is [name] large (67-100%) amount of necrotic tissue within the wound bed including Eschar and Adherent Slough. The periwound skin  appearance had no abnormalities noted for moisture. The periwound skin appearance exhibited: Callus. The periwound skin appearance did not exhibit: Erythema. Periwound temperature was noted as No Abnormality. Wound #2 status is Open. Original cause of wound was Bump. The date acquired was: 09/28/2021. The wound has been in treatment 52 weeks. The wound is located on the Right Gluteus. The wound measures 0.5cm length x 0.7cm width x 2.2cm depth; 0.275cm^2 area and 0.605cm^3 volume. There is Fat Layer (Subcutaneous Tissue) exposed. There is no tunneling or undermining noted. There is [name] medium amount of serous drainage noted. The wound margin is epibole. There is large (67-100%) red granulation within the wound bed. There is no necrotic tissue within the wound bed. The periwound skin appearance had no abnormalities noted for texture. The periwound skin appearance had no abnormalities noted for moisture. The periwound skin appearance had no abnormalities noted for color. Periwound temperature was noted as No Abnormality. The periwound has tenderness on palpation. Wound #4 status is Open. Original cause of wound was Pressure Injury. The date acquired was: 12/23/2021. The wound has been in treatment 47 weeks. The wound is located on the Left,Lateral Ankle. The wound measures 0.5cm length x 0.5cm width x 0.2cm depth; 0.196cm^2 area and 0.039cm^3 volume.  There is Fat Layer (Subcutaneous Tissue) exposed. There is no tunneling or undermining noted. There is [name] medium amount of serous drainage noted. The wound margin is distinct with the outline attached to the wound base. There is medium (34-66%) pink granulation within the wound bed. There is [name] medium (34-66%) amount of necrotic tissue within the wound bed including Adherent Slough. The periwound skin appearance had no abnormalities noted for texture. The periwound skin appearance had no abnormalities noted for moisture. The periwound skin appearance did not exhibit: Rubor, Erythema. Periwound temperature was noted as No Abnormality. The periwound has tenderness on palpation. 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 There was [name] Excisional Skin/Subcutaneous Tissue Debridement with [name] total area of 0.13 sq cm performed by Duanne Guess, MD. With the following instrument(s): Curette to remove Non-Viable tissue/material. Material removed includes Subcutaneous Tissue, Slough, and Skin: Epidermis after achieving pain control using Lidocaine 4% Topical Solution. No specimens were taken. [name] time out was conducted at 14:40, prior to the start of the procedure. [name] Minimum amount of bleeding was controlled with Pressure. The procedure was tolerated well. Post Debridement Measurements: 0.4cm length x 0.4cm width x 0.1cm depth; 0.013cm^3 volume. Post debridement Stage noted as Category/Stage III. Character of  Wound/Ulcer Post Debridement is improved. Post procedure Diagnosis Wound #1: Same as Pre-Procedure Wound #4 Pre-procedure diagnosis of Wound #4 is [name] Pressure Ulcer located on the Left,Lateral Ankle . There was [name] Excisional Skin/Subcutaneous Tissue Debridement with [name] total area of 0.2 sq cm performed by Duanne Guess, MD. With the following instrument(s): Curette to remove Non-Viable tissue/material. Material removed includes Subcutaneous Tissue and Slough and after achieving pain control using Lidocaine 4% T opical Solution. No specimens were taken. [name] time out was conducted at 14:40, prior to the start of the procedure. [name] Minimum amount of bleeding was controlled with Pressure.  The procedure was tolerated well. Post Debridement Measurements: 0.5cm length x 0.5cm width x 0.2cm depth; 0.039cm^3 volume. Post debridement Stage noted as Category/Stage IV. Character of Wound/Ulcer Post Debridement is improved. Post procedure Diagnosis Wound #4: Same as Pre-Procedure Plan Follow-up Appointments: Return Appointment in 2 weeks. - Dr. Lady Gary rm 1 Anesthetic: (In clinic) Topical Lidocaine 4% applied to wound bed Bathing/ Shower/ Hygiene: May shower and wash wound with soap and water. - with dressing changes Off-Loading: Multipodus Splint to: - prevalon boot to both feet Turn and reposition every 2 hours Home Health: No change in wound care orders this week; continue Home Health for wound care. May utilize formulary equivalent dressing for wound treatment orders unless otherwise specified. Dressing changes to be completed by Home Health on Monday / Wednesday / Friday except when patient has scheduled visit at [facility]. Other Home Health Orders/Instructions: - [facility] The following medication(s) was prescribed: lidocaine topical 4 % cream cream topical was prescribed at facility WOUND #1: - Malleolus Wound Laterality: Right, Medial Cleanser: Normal Saline (Generic) 3 x Per Week/30  Days Discharge Instructions: Cleanse the wound with Normal Saline prior to applying [name] clean dressing using gauze sponges, not tissue or cotton balls. Prim Dressing: Promogran Prisma Matrix, 4.34 (sq in) (silver collagen) (Dispense As Written) 3 x Per Week/30 Days ary Discharge Instructions: Moisten collagen with saline or hydrogel Secondary Dressing: Optifoam Non-Adhesive Dressing, 4x4 in (Generic) 3 x Per Week/30 Days Discharge Instructions: Apply over primary dressing cut to make [name] foam donut Secondary Dressing: Woven Gauze Sponges 2x2 in (Generic) 3 x Per Week/30 Days Discharge Instructions: Apply over primary dressing as directed. Secured With: [employer], 4.5x3.1 (in/yd) 3 x Per Week/30 Days Discharge Instructions: Secure with Kerlix as directed. Secured With: 51M Medipore H Soft Cloth Surgical T ape, 4 x 10 (in/yd) (Generic) 3 x Per Week/30 Days Discharge Instructions: Secure with tape as directed. WOUND #2: - Gluteus Wound Laterality: Right Prim Dressing: Dakin's Solution 0.25%, 16 (oz) 1 x Per Day/30 Days ary Discharge Instructions: Moisten gauze with Dakin's solution Prim Dressing: Promogran Prisma Matrix, 4.34 (sq in) (silver collagen) (Dispense As Written) 1 x Per Day/30 Days ary Discharge Instructions: Moisten collagen with saline or hydrogel Secondary Dressing: Zetuvit Plus Silicone Border Dressing 4x4 (in/in) (Dispense As Written) 1 x Per Day/30 Days Discharge Instructions: Apply silicone border over primary dressing as directed. WOUND #4: - Ankle Wound Laterality: Left, Lateral Cleanser: Normal Saline (Generic) 3 x Per Week/30 Days Discharge Instructions: Cleanse the wound with Normal Saline prior to applying [name] clean dressing using gauze sponges, not tissue or cotton balls. Prim Dressing: Promogran Prisma Matrix, 4.34 (sq in) (silver collagen) (Dispense As Written) 3 x Per Week/30 Days ary Discharge Instructions: Moisten collagen with saline or hydrogel Secondary  Dressing: Optifoam Non-Adhesive Dressing, 4x4 in (Generic) 3 x Per Week/30 Days Discharge Instructions: Apply over primary dressing cut to make [name] foam donut Secondary Dressing: Woven Gauze Sponges 2x2 in (Generic) 3 x Per Week/30 Days Discharge Instructions: Apply over primary dressing as directed. Secured With: [employer], 4.5x3.1 (in/yd) 3 x Per Week/30 Days Discharge Instructions: Secure with Kerlix as directed. Secured With: 51M Medipore H Soft Cloth Surgical T ape, 4 x 10 (in/yd) (Generic) 3 x Per Week/30 Days Discharge Instructions: Secure with tape as directed. 11/29/2022: Both ankle wounds are slightly deeper today, but the overall diameter of each seems to be smaller. The quality of the tissue on the wound surface is quite good. There is  slough and eschar on each of these. Her buttock ulcer continues to contract. The depth has come in by about half. Skin continues to grow down the sides of the cavity. No malodor or purulent drainage. I used [name] curette to debride slough, eschar, and subcutaneous tissue from the ankle wounds. We will continue the Prisma silver collagen with foam doughnuts on the ankles. The buttock ulcer did not require any debridement. Will continue to apply collagen at the base of the wound and packed the cavity with Dakin'sARDEL, Andrea [name] ([id number]) [id number] Page 11 of 13 moistened gauze. Continue offloading and protein intake efforts. Follow-up in 2 weeks. Electronic Signature(s) Signed: 11/29/2022 2:51:17 PM By: Duanne Guess MD FACS Entered By: Duanne Guess on 11/29/2022 11:51:17 -------------------------------------------------------------------------------- HxROS Details Patient Name: Date of Service: Andrea [name] 11/29/2022 2:15 PM Medical Record Number: [id number] Patient Account Number: [id number] Date of Birth/Sex: Treating RN: [birth year] (81 y.o. F) Primary Care Provider: Jarome [name] Other  Clinician: Referring Provider: Treating Provider/Extender: Marena Chancy in Treatment: 52 Information Obtained From Patient Caregiver Chart Constitutional Symptoms (General Health) Medical History: Past Medical History Notes: morbid obesity Eyes Medical History: Positive for: Cataracts - bil extracted Negative for: Glaucoma; Optic Neuritis Ear/Nose/Mouth/Throat Medical History: Past Medical History Notes: hard of hearing Cardiovascular Medical History: Positive for: Arrhythmia - afib; Hypertension Gastrointestinal Medical History: Past Medical History Notes: GERD Endocrine Medical History: Negative for: Type I Diabetes; Type II Diabetes Past Medical History Notes: adrenal insufficiency Genitourinary Medical History: Negative for: End Stage Renal Disease Integumentary (Skin) Medical History: Negative for: History of Burn Musculoskeletal Medical History: Positive for: Osteoarthritis; Osteomyelitis - pelvic Past Medical History NotesTESHARA, MOREE ([id number]) [phone].pdf Page 12 of 13 psoriatic arthritis, thoracic discitis, displaced spiral fx right tibia Neurologic Medical History: Positive for: Neuropathy Past Medical History Notes: hx [facility] Oncologic Medical History: Negative for: Received Chemotherapy; Received Radiation Psychiatric Medical History: Positive for: Confinement Anxiety Negative for: Anorexia/bulimia HBO Extended History Items Eyes: Cataracts Immunizations Pneumococcal Vaccine: Received Pneumococcal Vaccination: Yes Received Pneumococcal Vaccination On or After 60th Birthday: Yes Implantable Devices None Hospitalization / Surgery History Type of Hospitalization/Surgery TEE right ankle fusion bil knee replacements bil cataract extractions posterior lumbar fusion vaginal hysterectomy cholecystectomy Family and Social History Cancer: Yes - Father,Maternal  Grandparents,Siblings,Child; Diabetes: No; Heart Disease: Yes - Father; Hereditary Spherocytosis: No; Hypertension: Yes - Father; Kidney Disease: No; Lung Disease: No; Seizures: No; Stroke: No; Thyroid Problems: Yes - Mother; Tuberculosis: No; Former smoker - quit 40 yr ago; Marital Status - Widowed; Alcohol Use: Never; Drug Use: No History; Caffeine Use: Daily - tea, soda; Financial Concerns: No; Food, Clothing or Shelter Needs: No; Support System Lacking: No; Transportation Concerns: No Electronic Signature(s) Signed: 11/29/2022 3:08:08 PM By: Duanne Guess MD FACS Entered By: Duanne Guess on 11/29/2022 11:48:41 -------------------------------------------------------------------------------- SuperBill Details Patient Name: Date of Service: Andrea [name] 11/29/2022 Medical Record Number: [id number] Patient Account Number: [id number] Date of Birth/Sex: Treating RN: 1940-10-14 (81 y.o. 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 L02.31 Cutaneous abscess of buttock E27.40 Unspecified adrenocortical insufficiency Z79.52 Long term (current) use of systemic steroids I10 Essential (primary) hypertension Facility  Procedures : CPT4 Code: 32202542 Description: 11042 - DEB SUBQ TISSUE 20 SQ CM/< ICD-10 Diagnosis Description 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, stage 3 L89.523 Pressure ulcer of left ankle, stage 3 Modifier: Quantity: 1 Physician Procedures : CPT4 Code Description Modifier [id number] 99214 - WC PHYS LEVEL 4 - EST PT ICD-10 Diagnosis  Description 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, stage 3 L89.523 Pressure ulcer of left ankle, stage 3 L98.415 Non-pressure chronic ulcer of buttock with muscle  involvement without evidence of necrosis Z79.52 Long term (current) use of systemic steroids Quantity: 1 : [id number] 11042 - WC PHYS SUBQ TISS 20 SQ CM ICD-10 Diagnosis Description 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, stage 3 L89.523 Pressure ulcer of left ankle, stage 3 Quantity: 1 Electronic Signature(s) Signed: 11/29/2022 2:52:04 PM By: Duanne Guess MD FACS Entered By: Duanne Guess on 11/29/2022 11:52:03

## 2022-11-29 NOTE — Progress Notes (Signed)
MAHLIA, FERNANDO A ([id number]) [id number] Page 1 of 10 Visit Report for 11/29/2022 Arrival Information Details Patient Name: Date of Service: Andrea [name] 11/29/2022 2:15 PM Medical Record Number: [id number] Patient Account Number: [id number] Date of Birth/Sex: Treating RN: 1940/05/26 (82 y.o. F) Primary Care Emanuella Nickle: Jarome Matin Other Clinician: Referring Taggert Bozzi: Treating Laxmi Choung/Extender: Marena Chancy in Treatment: 52 Visit Information History Since Last Visit Added or deleted any medications: No Patient Arrived: Wheel Chair Any new allergies or adverse reactions: No Arrival Time: 14:22 Had a fall or experienced change in No Accompanied By: aide activities of daily living that may affect Transfer Assistance: None risk of falls: Patient Identification Verified: Yes Signs or symptoms of abuse/neglect since last visito No Secondary Verification Process Completed: Yes Hospitalized since last visit: No Patient Requires Transmission-Based Precautions: No Implantable device outside of the clinic excluding No Patient Has Alerts: No cellular tissue based products placed in the center since last visit: Pain Present Now: No Electronic Signature(s) Signed: 11/29/2022 2:42:44 PM By: Dayton Scrape Entered By: Dayton Scrape on 11/29/2022 11:23:12 -------------------------------------------------------------------------------- Encounter Discharge Information Details Patient Name: Date of Service: Andrea [name] 11/29/2022 2:15 PM Medical Record Number: [id number] Patient Account Number: [id number] Date of Birth/Sex: Treating RN: 1940-08-12 (82 y.o. [name] Primary Care Shaquill Iseman: Jarome Matin Other Clinician: Referring Ataya Murdy: Treating Mako Pelfrey/Extender: Marena Chancy in Treatment: 25 Encounter Discharge Information Items Post Procedure Vitals Discharge Condition:  Stable Temperature (F): 97.9 Ambulatory Status: Wheelchair Pulse (bpm): 68 Discharge Destination: Home Respiratory Rate (breaths/min): 18 Transportation: Private Auto Blood Pressure (mmHg): 147/62 Accompanied By: caregiver Schedule Follow-up Appointment: Yes Clinical Summary of Care: Patient Declined Electronic Signature(s) Signed: 11/29/2022 4:14:29 PM By: Samuella Bruin Entered By: Samuella Bruin on 11/29/2022 11:59:08 [name] ([id number]) [id number] Page 2 of 10 -------------------------------------------------------------------------------- Lower Extremity Assessment Details Patient Name: Date of Service: Andrea [name] 11/29/2022 2:15 PM Medical Record Number: [id number] Patient Account Number: [id number] Date of Birth/Sex: Treating RN: 1940/08/27 (82 y.o. [name] Primary Care Gurjot Brisco: Jarome Matin Other Clinician: Referring Kaisei Gilbo: Treating Michial Disney/Extender: Marena Chancy in Treatment: 52 Edema Assessment Assessed: [Left: No] [Right: No] Edema: [Left: No] [Right: No] Calf Left: Right: Point of Measurement: From Medial Instep 28 cm 32 cm Ankle Left: Right: Point of Measurement: From Medial Instep 18.2 cm 22 cm Vascular Assessment Extremity colors, hair growth, and conditions: Extremity Color: [Left:Hyperpigmented] [Right:Hyperpigmented] Hair Growth on Extremity: [Left:No] [Right:No] Temperature of Extremity: [Left:Warm < 3 seconds] [Right:Warm < 3 seconds] Electronic Signature(s) Signed: 11/29/2022 4:14:29 PM By: Samuella Bruin Entered By: Samuella Bruin on 11/29/2022 11:31:02 -------------------------------------------------------------------------------- Multi Wound Chart Details Patient Name: Date of Service: Andrea [name] 11/29/2022 2:15 PM Medical Record Number: [id number] Patient Account Number: [id number] Date of Birth/Sex: Treating RN: May 17, 1940 (81 y.o.  F) Primary Care Mellissa Conley: Jarome Matin Other Clinician: Referring Ahmani Prehn: Treating Marylene Masek/Extender: Marena Chancy in Treatment: 52 Vital Signs Height(in): 59 Pulse(bpm): 68 Weight(lbs): 155 Blood Pressure(mmHg): 147/62 Body Mass Index(BMI): 31.3 Temperature(F): 97.9 Respiratory Rate(breaths/min): 18 [1:Photos:] [[id number] Page 3 of 10] Right, Medial Malleolus Right Gluteus Left, Lateral Ankle Wound Location: Pressure Injury Bump Pressure Injury Wounding Event: Pressure Ulcer Abscess Pressure Ulcer Primary Etiology: Cataracts, Arrhythmia, Hypertension,Cataracts, Arrhythmia, Hypertension, Cataracts, Arrhythmia, Hypertension, Comorbid History: Osteoarthritis, Osteomyelitis, Osteoarthritis, Osteomyelitis, Osteoarthritis, Osteomyelitis, Neuropathy, Confinement Anxiety Neuropathy, Confinement Anxiety Neuropathy, Confinement Anxiety 11/16/2021 09/28/2021 12/23/2021 Date Acquired: 52 52 47 Weeks of Treatment: Open Open Open Wound Status: No  No No Wound Recurrence: 0.4x0.4x0.1 0.5x0.7x2.2 0.5x0.5x0.2 Measurements L x W x D (cm) 0.126 0.275 0.196 A (cm) : rea 0.013 0.605 0.039 Volume (cm) : 91.80% 97.40% -39.00% % Reduction in A rea: 91.60% 98.10% -178.60% % Reduction in Volume: Category/Stage III Full Thickness With Exposed Support Category/Stage IV Classification: Structures Medium Medium Medium Exudate A mount: Purulent Serous Serous Exudate Type: yellow, brown, green amber amber Exudate Color: Distinct, outline attached Epibole Distinct, outline attached Wound Margin: Small (1-33%) Large (67-100%) Medium (34-66%) Granulation A mount: Red, Pink Red Pink Granulation Quality: Large (67-100%) None Present (0%) Medium (34-66%) Necrotic A mount: Eschar, Adherent Slough N/A Adherent Slough Necrotic Tissue: Fat Layer (Subcutaneous Tissue): Yes Fat Layer (Subcutaneous Tissue): Yes Fat Layer (Subcutaneous  Tissue): Yes Exposed Structures: Fascia: No Fascia: No Fascia: No Tendon: No Tendon: No Tendon: No Muscle: No Muscle: No Muscle: No Joint: No Joint: No Joint: No Bone: No Bone: No Bone: No Small (1-33%) Medium (34-66%) Small (1-33%) Epithelialization: Debridement - Excisional N/A Debridement - Excisional Debridement: Pre-procedure Verification/Time Out 14:40 N/A 14:40 Taken: Lidocaine 4% Topical Solution N/A Lidocaine 4% Topical Solution Pain Control: Subcutaneous, Slough N/A Subcutaneous, Slough Tissue Debrided: Skin/Subcutaneous Tissue N/A Skin/Subcutaneous Tissue Level: 0.13 N/A 0.2 Debridement A (sq cm): rea Curette N/A Curette Instrument: Minimum N/A Minimum Bleeding: Pressure N/A Pressure Hemostasis A chieved: Procedure was tolerated well N/A Procedure was tolerated well Debridement Treatment Response: 0.4x0.4x0.1 N/A 0.5x0.5x0.2 Post Debridement Measurements L x W x D (cm) 0.013 N/A 0.039 Post Debridement Volume: (cm) Category/Stage III N/A Category/Stage IV Post Debridement Stage: Callus: Yes No Abnormalities Noted No Abnormalities Noted Periwound Skin Texture: Maceration: No No Abnormalities Noted Maceration: No Periwound Skin Moisture: Erythema: No Erythema: No Erythema: No Periwound Skin Color: Rubor: No No Abnormality No Abnormality No Abnormality Temperature: N/A Yes Yes Tenderness on Palpation: Debridement N/A Debridement Procedures Performed: Treatment Notes Electronic Signature(s) Signed: 11/29/2022 2:46:33 PM By: Duanne Guess MD FACS Entered By: Duanne Guess on 11/29/2022 11:46:33 -------------------------------------------------------------------------------- Multi-Disciplinary Care Plan Details Patient Name: Date of Service: Andrea [name]. 11/29/2022 2:15 PM Medical Record Number: [id number] Patient Account Number: [id number] Date of Birth/Sex: Treating RN: Feb 17, 1940 (81 y.o. [name] Primary Care  Ugochi Henzler: Jarome Matin Other Clinician: Referring Moira Umholtz: Treating Lewie Deman/Extender: Marena Chancy in Treatment: [address], [place] A ([id number]) [id number] Page 4 of 10 Multidisciplinary Care Plan reviewed with physician Active Inactive Pressure Nursing Diagnoses: Knowledge deficit related to causes and risk factors for pressure ulcer development Knowledge deficit related to management of pressures ulcers Potential for impaired tissue integrity related to pressure, friction, moisture, and shear Goals: Patient/caregiver will verbalize understanding of pressure ulcer management Date Initiated: 11/24/2021 Target Resolution Date: 12/22/2022 Goal Status: Active Interventions: Assess: immobility, friction, shearing, incontinence upon admission and as needed Assess offloading mechanisms upon admission and as needed Assess potential for pressure ulcer upon admission and as needed Provide education on pressure ulcers Treatment Activities: Pressure reduction/relief device ordered : 11/24/2021 Notes: Wound/Skin Impairment Nursing Diagnoses: Impaired tissue integrity Knowledge deficit related to ulceration/compromised skin integrity Goals: Patient/caregiver will verbalize understanding of skin care regimen Date Initiated: 11/24/2021 Target Resolution Date: 12/22/2022 Goal Status: Active Ulcer/skin breakdown will have a volume reduction of 30% by week 4 Date Initiated: 11/24/2021 Date Inactivated: 05/31/2022 Target Resolution Date: 05/25/2022 Unmet Reason: refuses VAC, Goal Status: Unmet noncompliant with offloading Interventions: Assess patient/caregiver ability to obtain necessary supplies Assess patient/caregiver ability to perform ulcer/skin care regimen upon admission and as needed Assess ulceration(s) every visit Provide  education on ulcer and skin care Treatment Activities: Skin care regimen initiated :  11/24/2021 Topical wound management initiated : 11/24/2021 Notes: Electronic Signature(s) Signed: 11/29/2022 4:36:46 PM By: Zenaida Deed RN, BSN Entered By: Zenaida Deed on 11/29/2022 11:16:44 -------------------------------------------------------------------------------- Pain Assessment Details Patient Name: Date of Service: Andrea [name] 11/29/2022 2:15 PM Medical Record Number: [id number] Patient Account Number: [id number] Date of Birth/Sex: Treating RN: 12-14-1940 (81 y.o. F) Primary Care Darleth Eustache: Jarome Matin Other Clinician: Referring Yehia Mcbain: Treating Pheobe Sandiford/Extender: Marena Chancy in Treatment: [address], [place] ([id number]) [id number] Page 5 of 10 Active Problems Location of Pain Severity and Description of Pain Patient Has Paino No Site Locations Pain Management and Medication Current Pain Management: Electronic Signature(s) Signed: 11/29/2022 2:42:44 PM By: Dayton Scrape Entered By: Dayton Scrape on 11/29/2022 11:23:49 -------------------------------------------------------------------------------- Patient/Caregiver Education Details Patient Name: Date of Service: Andrea [name] 11/4/2024andnbsp2:15 PM Medical Record Number: [id number] Patient Account Number: [id number] Date of Birth/Gender: Treating RN: 1940/08/19 (82 y.o. [name] Primary Care Physician: Jarome Matin Other Clinician: Referring Physician: Treating Physician/Extender: Marena Chancy in Treatment: 42 Education Assessment Education Provided To: Patient Education Topics Provided Pressure: Methods: Explain/Verbal Responses: Reinforcements needed, State content correctly Wound/Skin Impairment: Methods: Explain/Verbal Responses: Reinforcements needed, State content correctly Electronic Signature(s) Signed: 11/29/2022 4:36:46 PM By: Zenaida Deed RN, BSN Entered By: Zenaida Deed on  11/29/2022 11:17:23 [name] ([id number]) [id number] Page 6 of 10 -------------------------------------------------------------------------------- Wound Assessment Details Patient Name: Date of Service: Andrea [name] 11/29/2022 2:15 PM Medical Record Number: [id number] Patient Account Number: [id number] Date of Birth/Sex: Treating RN: 27-Aug-1940 (82 y.o. [name] Primary Care Shaine Newmark: Jarome Matin Other Clinician: Referring Keandria Berrocal: Treating Woodson Macha/Extender: Marena Chancy in Treatment: 52 Wound Status Wound Number: 1 Primary Pressure Ulcer Etiology: Wound Location: Right, Medial Malleolus Wound Open Wounding Event: Pressure Injury Status: Date Acquired: 11/16/2021 Comorbid Cataracts, Arrhythmia, Hypertension, Osteoarthritis, Weeks Of Treatment: 52 History: Osteomyelitis, Neuropathy, Confinement Anxiety Clustered Wound: No Photos Wound Measurements Length: (cm) 0 Width: (cm) 0 Depth: (cm) 0 Area: (cm) Volume: (cm) .4 % Reduction in Area: 91.8% .4 % Reduction in Volume: 91.6% .1 Epithelialization: Small (1-33%) 0.126 Tunneling: No 0.013 Undermining: No Wound Description Classification: Category/Stage III Wound Margin: Distinct, outline attached Exudate Amount: Medium Exudate Type: Purulent Exudate Color: yellow, brown, green Foul Odor After Cleansing: No Slough/Fibrino Yes Wound Bed Granulation Amount: Small (1-33%) Exposed Structure Granulation Quality: Red, Pink Fascia Exposed: No Necrotic Amount: Large (67-100%) Fat Layer (Subcutaneous Tissue) Exposed: Yes Necrotic Quality: Eschar, Adherent Slough Tendon Exposed: No Muscle Exposed: No Joint Exposed: No Bone Exposed: No Periwound Skin Texture Texture Color No Abnormalities Noted: No No Abnormalities Noted: No Callus: Yes Erythema: No Moisture Temperature / Pain No Abnormalities Noted: Yes Temperature: No  Abnormality Treatment Notes Wound #1 (Malleolus) Wound Laterality: Right, Medial Cleanser Normal Saline TINEY, ZIPPER A ([id number]) [id number] Page 7 of 10 Discharge Instruction: Cleanse the wound with Normal Saline prior to applying a clean dressing using gauze sponges, not tissue or cotton balls. Peri-Wound Care Topical Primary Dressing Promogran Prisma Matrix, 4.34 (sq in) (silver collagen) Discharge Instruction: Moisten collagen with saline or hydrogel Secondary Dressing Optifoam Non-Adhesive Dressing, 4x4 in Discharge Instruction: Apply over primary dressing cut to make a foam donut Woven Gauze Sponges 2x2 in Discharge Instruction: Apply over primary dressing as directed. Secured With [employer], 4.5x3.1 (in/yd) Discharge Instruction: Secure with Kerlix as directed. 45M Medipore H Soft Cloth Surgical  T ape, 4 x 10 (in/yd) Discharge Instruction: Secure with tape as directed. Compression Wrap Compression Stockings Add-Ons Electronic Signature(s) Signed: 11/29/2022 2:42:44 PM By: Dayton Scrape Signed: 11/29/2022 4:14:29 PM By: Samuella Bruin Entered By: Dayton Scrape on 11/29/2022 11:35:47 -------------------------------------------------------------------------------- Wound Assessment Details Patient Name: Date of Service: Andrea [name] 11/29/2022 2:15 PM Medical Record Number: [id number] Patient Account Number: [id number] Date of Birth/Sex: Treating RN: 12/29/1940 (81 y.o. [name] Primary Care Brissia Delisa: Jarome Matin Other Clinician: Referring Amirrah Quigley: Treating Abdulahi Schor/Extender: Marena Chancy in Treatment: 52 Wound Status Wound Number: 2 Primary Abscess Etiology: Wound Location: Right Gluteus Wound Open Wounding Event: Bump Status: Date Acquired: 09/28/2021 Comorbid Cataracts, Arrhythmia, Hypertension, Osteoarthritis, Weeks Of Treatment: 52 History: Osteomyelitis, Neuropathy,  Confinement Anxiety Clustered Wound: No Photos Wound Measurements Length: (cm) 0.5 Width: (cm) 0.7 Konicki, Ivannia A ([id number]) Depth: (cm) 2.2 Area: (cm) 0.275 Volume: (cm) 0.605 % Reduction in Area: 97.4% % Reduction in Volume: 98.1% [id number] Page 8 of 10 Epithelialization: Medium (34-66%) Tunneling: No Undermining: No Wound Description Classification: Full Thickness With Exposed Suppo Wound Margin: Epibole Exudate Amount: Medium Exudate Type: Serous Exudate Color: amber rt Structures Foul Odor After Cleansing: No Slough/Fibrino No Wound Bed Granulation Amount: Large (67-100%) Exposed Structure Granulation Quality: Red Fascia Exposed: No Necrotic Amount: None Present (0%) Fat Layer (Subcutaneous Tissue) Exposed: Yes Tendon Exposed: No Muscle Exposed: No Joint Exposed: No Bone Exposed: No Periwound Skin Texture Texture Color No Abnormalities Noted: Yes No Abnormalities Noted: Yes Moisture Temperature / Pain No Abnormalities Noted: Yes Temperature: No Abnormality Tenderness on Palpation: Yes Treatment Notes Wound #2 (Gluteus) Wound Laterality: Right Cleanser Peri-Wound Care Topical Primary Dressing Dakin's Solution 0.25%, 16 (oz) Discharge Instruction: Moisten gauze with Dakin's solution Promogran Prisma Matrix, 4.34 (sq in) (silver collagen) Discharge Instruction: Moisten collagen with saline or hydrogel Secondary Dressing Zetuvit Plus Silicone Border Dressing 4x4 (in/in) Discharge Instruction: Apply silicone border over primary dressing as directed. Secured With Compression Wrap Compression Stockings Add-Ons Electronic Signature(s) Signed: 11/29/2022 2:42:44 PM By: Dayton Scrape Signed: 11/29/2022 4:14:29 PM By: Samuella Bruin Entered By: Dayton Scrape on 11/29/2022 11:34:17 -------------------------------------------------------------------------------- Wound Assessment Details Patient Name: Date of Service: Andrea [name] 11/29/2022 2:15 PM Medical Record Number: [id number] Patient Account Number: [id number] Date of Birth/Sex: Treating RN: April 20, 1940 (81 y.o. [name] Primary Care Jodeci Roarty: Jarome Matin Other Clinician: Referring Marymargaret Kirker: Treating Daeveon Zweber/Extender: Marena Chancy in Treatment: [address], [place] A ([id number]) [id number] Page 9 of 10 Wound Status Wound Number: 4 Primary Pressure Ulcer Etiology: Wound Location: Left, Lateral Ankle Wound Open Wounding Event: Pressure Injury Status: Date Acquired: 12/23/2021 Comorbid Cataracts, Arrhythmia, Hypertension, Osteoarthritis, Weeks Of Treatment: 47 History: Osteomyelitis, Neuropathy, Confinement Anxiety Clustered Wound: No Photos Wound Measurements Length: (cm) 0.5 Width: (cm) 0.5 Depth: (cm) 0.2 Area: (cm) 0.196 Volume: (cm) 0.039 % Reduction in Area: -39% % Reduction in Volume: -178.6% Epithelialization: Small (1-33%) Tunneling: No Undermining: No Wound Description Classification: Category/Stage IV Wound Margin: Distinct, outline attached Exudate Amount: Medium Exudate Type: Serous Exudate Color: amber Foul Odor After Cleansing: No Slough/Fibrino Yes Wound Bed Granulation Amount: Medium (34-66%) Exposed Structure Granulation Quality: Pink Fascia Exposed: No Necrotic Amount: Medium (34-66%) Fat Layer (Subcutaneous Tissue) Exposed: Yes Necrotic Quality: Adherent Slough Tendon Exposed: No Muscle Exposed: No Joint Exposed: No Bone Exposed: No Periwound Skin Texture Texture Color No Abnormalities Noted: Yes No Abnormalities Noted: No Erythema: No Moisture Rubor: No No Abnormalities Noted: Yes Temperature / Pain Temperature: No Abnormality Tenderness  on Palpation: Yes Treatment Notes Wound #4 (Ankle) Wound Laterality: Left, Lateral Cleanser Normal Saline Discharge Instruction: Cleanse the wound with Normal Saline prior to applying a clean dressing  using gauze sponges, not tissue or cotton balls. Peri-Wound Care Topical Primary Dressing Promogran Prisma Matrix, 4.34 (sq in) (silver collagen) Discharge Instruction: Moisten collagen with saline or hydrogel Secondary Dressing Optifoam Non-Adhesive Dressing, 4x4 in Discharge Instruction: Apply over primary dressing cut to make a foam donut ALANEA, WOOLRIDGE A ([id number]) [id number] Page 10 of 10 Woven Gauze Sponges 2x2 in Discharge Instruction: Apply over primary dressing as directed. Secured With [employer], 4.5x3.1 (in/yd) Discharge Instruction: Secure with Kerlix as directed. 54M Medipore H Soft Cloth Surgical T ape, 4 x 10 (in/yd) Discharge Instruction: Secure with tape as directed. Compression Wrap Compression Stockings Add-Ons Electronic Signature(s) Signed: 11/29/2022 2:42:44 PM By: Dayton Scrape Signed: 11/29/2022 4:14:29 PM By: Samuella Bruin Entered By: Dayton Scrape on 11/29/2022 11:36:20 -------------------------------------------------------------------------------- Vitals Details Patient Name: Date of Service: Andrea [name] 11/29/2022 2:15 PM Medical Record Number: [id number] Patient Account Number: [id number] Date of Birth/Sex: Treating RN: 26-Dec-1940 (81 y.o. F) Primary Care Loveta Dellis: Jarome Matin Other Clinician: Referring Deandria Klute: Treating Shahzaib Azevedo/Extender: Marena Chancy in Treatment: 52 Vital Signs Time Taken: 02:23 Temperature (F): 97.9 Height (in): 59 Pulse (bpm): 68 Weight (lbs): 155 Respiratory Rate (breaths/min): 18 Body Mass Index (BMI): 31.3 Blood Pressure (mmHg): 147/62 Reference Range: 80 - 120 mg / dl Electronic Signature(s) Signed: 11/29/2022 2:42:44 PM By: Dayton Scrape Entered By: Dayton Scrape on 11/29/2022 11:23:42

## 2022-12-01 ENCOUNTER — Encounter (HOSPITAL_BASED_OUTPATIENT_CLINIC_OR_DEPARTMENT_OTHER): Payer: Medicare Other | Admitting: General Surgery

## 2022-12-07 ENCOUNTER — Ambulatory Visit (HOSPITAL_BASED_OUTPATIENT_CLINIC_OR_DEPARTMENT_OTHER): Payer: Medicare Other | Admitting: General Surgery

## 2022-12-15 ENCOUNTER — Encounter (HOSPITAL_BASED_OUTPATIENT_CLINIC_OR_DEPARTMENT_OTHER): Payer: Medicare Other | Admitting: General Surgery

## 2022-12-15 DIAGNOSIS — L89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, stage 3: Secondary | ICD-10-CM | POA: Diagnosis not present

## 2022-12-15 NOTE — Progress Notes (Signed)
Andrea Yu ([id number]) [id number] Page 1 of 13 Visit Report for 12/15/2022 Chief Complaint Document Details Patient Name: Date of Service: Andrea Yu 12/15/2022 1:45 PM Medical Record Number: [id number] Patient Account Number: [id number] Date of Birth/Sex: Treating RN: August 17, 1940 (82 y.o. F) Primary Care Provider: Jarome Matin Other Clinician: Referring Provider: Treating Provider/Extender: Marena Chancy in Treatment: 27 Information Obtained from: Patient Chief Complaint Patient is at the clinic for treatment of an open pressure ulcer on her right medial ankle, and Yu large abscess on her right buttock Electronic Signature(s) Signed: 12/15/2022 2:31:12 PM By: Duanne Guess MD FACS Entered By: Duanne Guess on 12/15/2022 11:31:12 -------------------------------------------------------------------------------- Debridement Details Patient Name: Date of Service: Andrea Yu 12/15/2022 1:45 PM Medical Record Number: [id number] Patient Account Number: [id number] Date of Birth/Sex: Treating RN: Aug 13, 1940 (81 y.o. Andrea Yu Primary Care Provider: Jarome Matin Other Clinician: Referring Provider: Treating Provider/Extender: Marena Chancy in Treatment: 55 Debridement Performed for Assessment: Wound #1 Right,Medial Malleolus Performed By: Physician Duanne Guess, MD The following information was scribed by: Tommie Ard The information was scribed for: Duanne Guess Debridement Type: Debridement Level of Consciousness (Pre-procedure): Awake and Alert Pre-procedure Verification/Time Out Yes - 14:23 Taken: Start Time: 14:24 Pain Control: Lidocaine 4% T opical Solution Percent of Wound Bed Debrided: 100% T Area Debrided (cm): otal 0.09 Tissue and other material debrided: Viable, Non-Viable, Slough, Subcutaneous, Slough Level: Skin/Subcutaneous Tissue Debridement  Description: Excisional Instrument: Curette Bleeding: Minimum Hemostasis Achieved: Pressure Response to Treatment: Procedure was tolerated well Level of Consciousness (Post- Awake and Alert procedure): Post Debridement Measurements of Total Wound Length: (cm) 0.4 Stage: Category/Stage III Width: (cm) 0.3 Depth: (cm) 0.2 Volume: (cm) 0.019 Character of Wound/Ulcer Post Debridement: Requires Further Debridement Post Procedure Diagnosis HADIL, DEMIRJIAN Yu ([id number]) [id number] Page 2 of 13 Same as Pre-procedure Electronic Signature(s) Signed: 12/15/2022 3:02:23 PM By: Tommie Ard RN Signed: 12/15/2022 3:57:12 PM By: Duanne Guess MD FACS Entered By: Tommie Ard on 12/15/2022 11:26:21 -------------------------------------------------------------------------------- Debridement Details Patient Name: Date of Service: Andrea Yu 12/15/2022 1:45 PM Medical Record Number: [id number] Patient Account Number: [id number] Date of Birth/Sex: Treating RN: 1940/03/11 (81 y.o. Andrea Yu Primary Care Provider: Jarome Matin Other Clinician: Referring Provider: Treating Provider/Extender: Marena Chancy in Treatment: 55 Debridement Performed for Assessment: Wound #4 Left,Lateral Ankle Performed By: Physician Duanne Guess, MD The following information was scribed by: Tommie Ard The information was scribed for: Duanne Guess Debridement Type: Debridement Level of Consciousness (Pre-procedure): Awake and Alert Pre-procedure Verification/Time Out Yes - 14:23 Taken: Start Time: 14:24 Pain Control: Lidocaine 4% T opical Solution Percent of Wound Bed Debrided: 100% T Area Debrided (cm): otal 0.07 Tissue and other material debrided: Viable, Non-Viable, Slough, Subcutaneous, Slough Level: Skin/Subcutaneous Tissue Debridement Description: Excisional Instrument: Curette Bleeding: Minimum Hemostasis Achieved:  Pressure Response to Treatment: Procedure was tolerated well Level of Consciousness (Post- Awake and Alert procedure): Post Debridement Measurements of Total Wound Length: (cm) 0.3 Stage: Category/Stage IV Width: (cm) 0.3 Depth: (cm) 0.2 Volume: (cm) 0.014 Character of Wound/Ulcer Post Debridement: Requires Further Debridement Post Procedure Diagnosis Same as Pre-procedure Electronic Signature(s) Signed: 12/15/2022 3:02:23 PM By: Tommie Ard RN Signed: 12/15/2022 3:57:12 PM By: Duanne Guess MD FACS Entered By: Tommie Ard on 12/15/2022 11:27:24 -------------------------------------------------------------------------------- HPI Details Patient Name: Date of Service: Andrea Lain Yu. 12/15/2022 1:45 PM Medical Record Number: [id number] Patient Account Number: [id number] Date of Birth/Sex: Treating RN: December 01, 1940 (81 y.o.  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 She is not diabetic and quit smoking over 40 years ago. 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 She has developed Yu pressure ulcer on the right medial malleolus. In addition, she apparently has had multiple cutaneous abscesses on her buttocks that have required repeated incision and drainage procedures. She has developed Yu large abscess on her right buttock that has become painful. She was recently treated with cephalexin by her PCP for urinary tract infection and also received Yu shot of Rocephin for the abscess, but it has not yet been incised or drained. She is nonambulatory  but is able to stand to transfer. She does not wear any sort of Prevalon boot. ABI in clinic today was 0.96. On her right medial malleolus, there is Yu circular ulcer. The surface is dry and fibrotic. There is some periwound erythema, but no malodor or purulent drainage. On her right buttock, there is Yu large fluctuant bulge about the size of an orange. It is also erythematous and tender. 12/04/2021: The culture from her abscess grew out staph. Yu 10-day course of Bactrim was prescribed and she is currently taking this. Unfortunately, Dakin's solution was not delivered and only 2 x 2 gauze was sent, rather than Kerlix so the packing of the wound has been rather suboptimal. The wound cavity has light slough over all of the surfaces. Although covered, bone is palpable. The medial ankle wound looks about the same with Yu layer of slough accumulation. In addition, her daughter peeled some dry skin off of her foot this morning and she now has denuded areas over the majority of the plantar surface of her right foot. 01/01/2022: The patient has missed several visits and to her daughters report that it is somewhat difficult to get her here on Yu weekly basis. In the interim, she has developed Yu new pressure ulcer on her left lateral malleolus. The plantar surface of her right foot has healed. The cavity on her right buttock has contracted, but it remains quite deep and the trochanter, while not exposed, is palpable at the base. The pressure ulcer on her right medial malleolus is basically unchanged. 02/09/2022: All wounds are little bit smaller today. As the buttock abscess cavity has contracted, it has left some tunneling that has not been adequately packed. Light slough accumulation on both ankle wounds. 02/26/2022: All of the wounds continue to contract. There is slough on both ankle wounds. The buttock ulcer is clean. She brought her wound VAC with her today. 03/12/2022: The ankle wounds are about the same this  week with slough accumulation. The buttock ulcer is smaller and quite clean. She decided that she could not tolerate the discomfort of the wound VAC and removed it. They have been packing the wound with Dakin's moistened gauze. 03/25/2022: The ankle wounds are slightly smaller. Both have slough accumulation. The orifice of the buttock ulcer has gotten quite Yu bit smaller than the underlying cavity. She has decided that she would like to have the wound VAC back. 3/14; patient presents for follow-up. She has bilateral ankle wounds and has been using Hydrofera Blue to the these wound beds.  She has Prevalon boots to help with offloading. She also has Yu sacral wound that we have been using Yu wound VAC for. She has no issues or complaints today. 04/29/2022: She once again decided she did not like the wound VAC and so they have been packing her gluteal wound with Dakin's moistened gauze. The gluteal ulcer seems to be about the same. Her left lateral ankle wound has gotten deeper and larger. The surface tissue is gray with thick slough. No significant change to the right medial ankle wound. 05/18/2022: The depth on the gluteal ulcer has come in Yu bit. There was Yu very strong pungent odor when the packing was removed, however. The right medial ankle wound is about the same size, but the tissue surface looks healthier. The left lateral ankle wound has reaccumulated the rubbery gray slough. 05/31/2022: The gluteal ulcer is shallower and no longer has any dips or crevices. The odor has abated completely. The right medial ankle wound is unchanged. The left lateral ankle wound looks quite Yu bit healthier with Yu cleaner surface and the beginnings of granulation tissue emergence. 06/16/2022: The gluteal ulcer has the same measurements more or less, but overall the volume seems smaller, as the cavity feels tighter on examination. The ankle wounds are basically the same. 07/12/2022: The cavity of the buttock ulcer continues to  contract. The left ankle wound has Yu little bit more granulation tissue, but bone remains exposed. The right ankle wound is flush with the surrounding skin. 07/21/2022: There is 1 deeper area in the buttock ulcer that I have not appreciated previously. I can feel bone under Yu layer of tissue. The rest of the cavity has contracted. Both ankle wounds are filling in with better granulation tissue. There is slough on both ankle wound surfaces. She struck her leg on her bed frame and has 2 new wounds on her right anterior tibial surface. Both have fat layer exposure but no concern for infection. 08/02/2022: I do not feel bone as easily in the buttock ulcer. There is slough on the surface. The cavity seems to be contracting. Both ankle wounds have improved tissue quality and there is granulation tissue nearly completely covering the bone on the left lateral malleolus. The wounds on her right anterior tibial surface have dried up. 08/10/2022: The cavity at the buttock ulcer has contracted further. The depth remains about the same. The surface is cleaner this week. Both ankle wounds are little bit smaller today. They have rubbery slough on the surface which appears to be leftover Oasis. Underneath, the quality of the tissue continues to improve and there is no longer any bone exposed on the left lateral malleolus. 08/16/2022: The buttock ulcer cavity has contracted further and she has less tenderness at the deepest aspect of the wound. Both ankle wounds look smaller today, although they did not measure any differently. Minimal slough accumulation with fairly healthy-looking granulation tissue present. 08/24/2022: The buttock ulcer cavity is smaller again today. She no longer has the tenderness at the deepest aspect of the wound. Both ankle wounds measured about Yu millimeter smaller. There is healthy-looking granulation tissue present bilaterally with minimal slough on the left and no slough seen on the  right. 08/31/2022: The cavity of the buttock ulcer continues to contract. It is about half Yu centimeter shallower than last week and there is no undermining. The cavity is also narrower and is more difficult to get an index finger into the wound. Both ankle wounds measured slightly smaller. Both  have Yu rather dry skin edge but good granulation tissue present with minimal slough. 09/08/2022: The buttock ulcer cavity is smaller again today. It is shallower and narrower. Both ankle wounds were Yu little bit smaller, as well. There is Yu fair amount of slough on the left lateral ankle wound. 09/14/2022: Unfortunately, the buttock ulcer cavity measured Yu little larger and Yu little deeper today. The patient did say she has been sitting Yu bit more of late. The right medial ankle wound is about the same size; the left lateral ankle wound is Yu little smaller and shallower. There is an area on the medial aspect of her right calcaneus that looks concerning for potential pressure-induced deep tissue injury. 09/29/2022: Both ankle wounds measured smaller today. Underneath the dried-on Oasis and slough, there is actually fairly healthy-looking granulation tissue filling in. The gluteal ulcer is narrower and tighter, but the depth has not really decreased at all. KELILAH, CERAMI Yu ([id number]) [id number] Page 4 of 13 10/05/2022: Once again, both ankle wounds measured slightly smaller. The tissue surfaces look healthy. The gluteal ulcer continues to contract circumferentially, but the depth remains stable. 10/12/2022: The depth on the gluteal ulcer came in by about half Yu centimeter this week. Both ankles look about the same with rubbery slough overlying granulation tissue. The granulation tissue is not quite as pink and robust-appearing as on previous occasions. 10/28/2022: The gluteal ulcer is shallower again this week without any tunneling. It continues to contract in all dimensions and the surface  is quite clean. Both ankle wounds are little bit smaller today. They have slough on the surfaces, but the underlying granulation tissue is healthier-looking. 11/09/2022: The ankle wounds continue to contract and fill with granulation tissue. They are quite clean with minimal slough on the surfaces. The gluteal ulcer has skin growing down the sides of the orifice. It measured shallower today. It is clean. 11/29/2022: Both ankle wounds are slightly deeper today, but the overall diameter of each seems to be smaller. The quality of the tissue on the wound surface is quite good. There is slough and eschar on each of these. Her buttock ulcer continues to contract. The depth has come in by about half. Skin continues to grow down the sides of the cavity. No malodor or purulent drainage. 12/15/2022: Both ankle wounds measure smaller and shallower today. There is some slough accumulation, particularly on the left ankle, but the underlying granulation tissue appears quite robust and healthy. The buttock cavity is smaller. The tissue appears healthy. No concern for infection. Electronic Signature(s) Signed: 12/15/2022 2:32:10 PM By: Duanne Guess MD FACS Entered By: Duanne Guess on 12/15/2022 11:32:10 -------------------------------------------------------------------------------- Physical Exam Details Patient Name: Date of Service: Andrea Yu 12/15/2022 1:45 PM Medical Record Number: [id number] Patient Account Number: [id number] Date of Birth/Sex: Treating RN: 11-12-1940 (82 y.o. F) Primary Care Provider: Jarome Matin Other Clinician: Referring Provider: Treating Provider/Extender: Marena Chancy in Treatment: 55 Constitutional no acute distress. Respiratory Normal work of breathing on room air.. Notes 12/15/2022: Both ankle wounds measure smaller and shallower today. There is some slough accumulation, particularly on the left ankle, but the  underlying granulation tissue appears quite robust and healthy. The buttock cavity is smaller. The tissue appears healthy. No concern for infection. Electronic Signature(s) Signed: 12/15/2022 2:32:49 PM By: Duanne Guess MD FACS Entered By: Duanne Guess on 12/15/2022 11:32:49 -------------------------------------------------------------------------------- Physician Orders Details Patient Name: Date of Service: Andrea Yu 12/15/2022 1:45 PM Medical Record Number: [id number]  Patient Account Number: [id number] Date of Birth/Sex: Treating RN: December 04, 1940 (82 y.o. 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 - Dr. Lady Gary rm 1 Anesthetic (In clinic) Topical Lidocaine 4% applied to wound bed Bathing/ Shower/ Hygiene May shower and wash wound with soap and water. - with dressing changes Off-Loading Multipodus Splint to: - prevalon boot to both feet Turn and reposition every 2 hours Home Health No change in wound care orders this week; continue Home Health for wound care. May utilize formulary equivalent dressing for wound treatment orders unless otherwise  specified. Dressing changes to be completed by Home Health on Monday / Wednesday / Friday except when patient has scheduled visit at [facility]. Other Home Health Orders/Instructions: - [facility] Wound Treatment Wound #1 - Malleolus Wound Laterality: Right, Medial Cleanser: Normal Saline (Generic) 3 x Per Week/30 Days Discharge Instructions: Cleanse the wound with Normal Saline prior to applying Yu clean dressing using gauze sponges, not tissue or cotton balls. Peri-Wound Care: Skin Prep (DME) (Dispense As Written) 3 x Per Week/30 Days Discharge Instructions: Use skin prep as directed Prim Dressing: Promogran Prisma Matrix, 4.34 (sq in) (silver collagen) (Dispense As Written) 3 x Per Week/30 Days ary Discharge Instructions: Moisten collagen with saline or hydrogel Secondary Dressing: Optifoam Non-Adhesive Dressing, 4x4 in (Generic) 3 x Per Week/30 Days Discharge Instructions: Apply over primary dressing cut to make Yu foam donut Secondary Dressing: Woven Gauze Sponges 2x2 in (DME) (Dispense As Written) 3 x Per Week/30 Days Discharge Instructions: Apply over primary dressing as directed. Secured With: [employer], 4.5x3.1 (in/yd) 3 x Per Week/30 Days Discharge Instructions: Secure with Kerlix as directed. Secured With: 10M Medipore H Soft Cloth Surgical T ape, 4 x 10 (in/yd) (DME) (Dispense As Written) 3 x Per Week/30 Days Discharge Instructions: Secure with tape as directed. Wound #2 - Gluteus Wound Laterality: Right Peri-Wound Care: Skin Prep (DME) (Dispense As Written) 1 x Per Day/30 Days Discharge Instructions: Use skin prep as directed Prim Dressing: Dakin's Solution 0.25%, 16 (oz) 1 x Per Day/30 Days ary Discharge Instructions: Moisten gauze with Dakin's solution Prim Dressing: Promogran Prisma Matrix, 4.34 (sq in) (silver collagen) (Dispense As Written) 1 x Per Day/30 Days ary Discharge Instructions: Moisten collagen with saline or hydrogel Secondary Dressing: Zetuvit  Plus Silicone Border Dressing 4x4 (in/in) (DME) (Dispense As Written) 1 x Per Day/30 Days Discharge Instructions: Apply silicone border over primary dressing as directed. Wound #4 - Ankle Wound Laterality: Left, Lateral Cleanser: Normal Saline (Generic) 3 x Per Week/30 Days Discharge Instructions: Cleanse the wound with Normal Saline prior to applying Yu clean dressing using gauze sponges, not tissue or cotton balls. Peri-Wound Care: Skin Prep 3 x Per Week/30 Days Discharge Instructions: Use skin prep as directed Prim Dressing: Promogran Prisma Matrix, 4.34 (sq in) (silver collagen) (Dispense As Written) 3 x Per Week/30 Days ary Discharge Instructions: Moisten collagen with  saline or hydrogel DOMINO, FONTANA Yu ([id number]) [id number] Page 6 of 13 Secondary Dressing: Optifoam Non-Adhesive Dressing, 4x4 in (Generic) 3 x Per Week/30 Days Discharge Instructions: Apply over primary dressing cut to make Yu foam donut Secondary Dressing: Woven Gauze Sponges 2x2 in (Generic) 3 x Per Week/30 Days Discharge Instructions: Apply over primary dressing as directed. Secured With: [employer], 4.5x3.1 (in/yd) 3 x Per Week/30 Days Discharge Instructions: Secure with Kerlix as directed. Secured With: 40M Medipore H Soft Cloth Surgical T ape, 4 x 10 (in/yd) (Generic) 3 x Per Week/30 Days Discharge Instructions: Secure with tape as directed. Electronic Signature(s) Signed: 12/15/2022 3:02:23 PM By: Tommie Ard RN Signed: 12/15/2022 3:57:12 PM By: Duanne Guess MD FACS Entered By: Tommie Ard on 12/15/2022 11:58:25 -------------------------------------------------------------------------------- Problem List Details Patient Name: Date of Service: Andrea Yu 12/15/2022 1:45 PM Medical Record Number: [id number] Patient Account Number: [id number] Date of Birth/Sex: Treating RN: 18-Oct-1940 (81 y.o. 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 without 01/01/2022 No Yes evidence of necrosis L02.31 Cutaneous abscess of buttock 11/24/2021 No Yes E27.40 Unspecified adrenocortical insufficiency 11/24/2021 No Yes Z79.52 Long term (current) use of systemic steroids 11/24/2021 No Yes I10 Essential (primary) hypertension 11/24/2021 No Yes Inactive Problems ICD-10 GRADIE, DODY Yu ([id number]) [id number] Page 7 of 13 Code Description Active Date Inactive Date L97.812 Non-pressure chronic ulcer of other part of right lower leg with fat layer exposed 07/21/2022 07/21/2022 Resolved Problems ICD-10 Code Description Active Date Resolved Date L97.511 Non-pressure chronic ulcer of other part of right foot limited to breakdown of skin 12/04/2021 12/04/2021 Electronic Signature(s) Signed: 12/15/2022 2:30:35 PM By: Duanne Guess MD FACS Entered By: Duanne Guess on 12/15/2022 11:30:35 -------------------------------------------------------------------------------- Progress Note Details Patient Name: Date of Service: Andrea Yu 12/15/2022 1:45 PM Medical Record Number: [id number] Patient Account Number: [id number] Date of Birth/Sex: Treating RN: [birth year] (82 y.o. 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 She is not diabetic and quit smoking over 40 years ago. 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 She has developed Yu pressure ulcer on the right medial malleolus. In addition, she apparently has had multiple cutaneous abscesses on her buttocks that have required repeated incision and drainage procedures. She has developed Yu large abscess on her right buttock that has become painful. She was recently treated with cephalexin by her PCP for urinary tract infection and also received Yu shot of Rocephin for the abscess, but it has not yet been incised or drained. She is nonambulatory but is able to stand to transfer. She does not wear any sort of Prevalon boot. ABI in clinic today was 0.96. On her right medial malleolus, there is Yu circular ulcer. The surface is dry  and fibrotic. There is some periwound erythema, but no malodor or purulent drainage. On her right buttock, there is Yu large fluctuant bulge about the size of an orange. It is also erythematous and tender. 12/04/2021: The culture from her abscess grew out staph. Yu 10-day course of Bactrim was prescribed and she is currently taking this. Unfortunately, Dakin's solution was not delivered and only 2 x 2 gauze was sent, rather than Kerlix so the packing of the wound has been rather suboptimal. The wound cavity has light slough over all of the surfaces. Although covered, bone is palpable. The medial ankle wound looks about the same with Yu layer of slough accumulation. In addition, her daughter peeled some dry skin off of her foot this morning and she now has denuded areas over the majority of the plantar surface of her right foot. 01/01/2022: The patient has missed several visits and to her daughters report that it is somewhat difficult to  get her here on Yu weekly basis. In the interim, she has developed Yu new pressure ulcer on her left lateral malleolus. The plantar surface of her right foot has healed. The cavity on her right buttock has contracted, but it remains quite deep and the trochanter, while not exposed, is palpable at the base. The pressure ulcer on her right medial malleolus is basically unchanged. 02/09/2022: All wounds are little bit smaller today. As the buttock abscess cavity has contracted, it has left some tunneling that has not been adequately packed. Light slough accumulation on both ankle wounds. 02/26/2022: All of the wounds continue to contract. There is slough on both ankle wounds. The buttock ulcer is clean. She brought her wound VAC with her today. 03/12/2022: The ankle wounds are about the same this week with slough accumulation. The buttock ulcer is smaller and quite clean. She decided that she could not tolerate the discomfort of the wound VAC and removed it. They have been packing the wound with Dakin's moistened gauze. 03/25/2022: The ankle wounds are slightly smaller. Both have slough accumulation. The orifice of the buttock ulcer has gotten quite Yu bit smaller than the underlying cavity. She has decided that she would like to have the wound VAC back. 3/14; patient presents for follow-up. She has bilateral ankle wounds and has been using Hydrofera Blue to the these wound beds. She has Prevalon boots to help with offloading. She also has Yu sacral wound that we have been using Yu wound VAC for. She has no issues or complaints today. 04/29/2022: She once again decided she did not like the wound VAC and so they have been packing her gluteal wound with Dakin's moistened gauze. The gluteal ulcer seems to be about the same. Her left lateral ankle wound has gotten deeper and larger. The surface tissue is gray with thick slough. No significant change to the right medial ankle wound. LAKENA, YIU Yu ([id number])  [id number] Page 8 of 13 05/18/2022: The depth on the gluteal ulcer has come in Yu bit. There was Yu very strong pungent odor when the packing was removed, however. The right medial ankle wound is about the same size, but the tissue surface looks healthier. The left lateral ankle wound has reaccumulated the rubbery gray slough. 05/31/2022: The gluteal ulcer is shallower and no longer has any dips or crevices. The odor has abated completely. The right medial ankle wound is unchanged. The left lateral ankle wound looks quite Yu bit healthier with Yu cleaner surface and the beginnings of granulation tissue emergence. 06/16/2022:  The gluteal ulcer has the same measurements more or less, but overall the volume seems smaller, as the cavity feels tighter on examination. The ankle wounds are basically the same. 07/12/2022: The cavity of the buttock ulcer continues to contract. The left ankle wound has Yu little bit more granulation tissue, but bone remains exposed. The right ankle wound is flush with the surrounding skin. 07/21/2022: There is 1 deeper area in the buttock ulcer that I have not appreciated previously. I can feel bone under Yu layer of tissue. The rest of the cavity has contracted. Both ankle wounds are filling in with better granulation tissue. There is slough on both ankle wound surfaces. She struck her leg on her bed frame and has 2 new wounds on her right anterior tibial surface. Both have fat layer exposure but no concern for infection. 08/02/2022: I do not feel bone as easily in the buttock ulcer. There is slough on the surface. The cavity seems to be contracting. Both ankle wounds have improved tissue quality and there is granulation tissue nearly completely covering the bone on the left lateral malleolus. The wounds on her right anterior tibial surface have dried up. 08/10/2022: The cavity at the buttock ulcer has contracted further. The depth remains about the same. The  surface is cleaner this week. Both ankle wounds are little bit smaller today. They have rubbery slough on the surface which appears to be leftover Oasis. Underneath, the quality of the tissue continues to improve and there is no longer any bone exposed on the left lateral malleolus. 08/16/2022: The buttock ulcer cavity has contracted further and she has less tenderness at the deepest aspect of the wound. Both ankle wounds look smaller today, although they did not measure any differently. Minimal slough accumulation with fairly healthy-looking granulation tissue present. 08/24/2022: The buttock ulcer cavity is smaller again today. She no longer has the tenderness at the deepest aspect of the wound. Both ankle wounds measured about Yu millimeter smaller. There is healthy-looking granulation tissue present bilaterally with minimal slough on the left and no slough seen on the right. 08/31/2022: The cavity of the buttock ulcer continues to contract. It is about half Yu centimeter shallower than last week and there is no undermining. The cavity is also narrower and is more difficult to get an index finger into the wound. Both ankle wounds measured slightly smaller. Both have Yu rather dry skin edge but good granulation tissue present with minimal slough. 09/08/2022: The buttock ulcer cavity is smaller again today. It is shallower and narrower. Both ankle wounds were Yu little bit smaller, as well. There is Yu fair amount of slough on the left lateral ankle wound. 09/14/2022: Unfortunately, the buttock ulcer cavity measured Yu little larger and Yu little deeper today. The patient did say she has been sitting Yu bit more of late. The right medial ankle wound is about the same size; the left lateral ankle wound is Yu little smaller and shallower. There is an area on the medial aspect of her right calcaneus that looks concerning for potential pressure-induced deep tissue injury. 09/29/2022: Both ankle wounds measured smaller  today. Underneath the dried-on Oasis and slough, there is actually fairly healthy-looking granulation tissue filling in. The gluteal ulcer is narrower and tighter, but the depth has not really decreased at all. 10/05/2022: Once again, both ankle wounds measured slightly smaller. The tissue surfaces look healthy. The gluteal ulcer continues to contract circumferentially, but the depth remains stable. 10/12/2022: The depth on the gluteal  ulcer came in by about half Yu centimeter this week. Both ankles look about the same with rubbery slough overlying granulation tissue. The granulation tissue is not quite as pink and robust-appearing as on previous occasions. 10/28/2022: The gluteal ulcer is shallower again this week without any tunneling. It continues to contract in all dimensions and the surface is quite clean. Both ankle wounds are little bit smaller today. They have slough on the surfaces, but the underlying granulation tissue is healthier-looking. 11/09/2022: The ankle wounds continue to contract and fill with granulation tissue. They are quite clean with minimal slough on the surfaces. The gluteal ulcer has skin growing down the sides of the orifice. It measured shallower today. It is clean. 11/29/2022: Both ankle wounds are slightly deeper today, but the overall diameter of each seems to be smaller. The quality of the tissue on the wound surface is quite good. There is slough and eschar on each of these. Her buttock ulcer continues to contract. The depth has come in by about half. Skin continues to grow down the sides of the cavity. No malodor or purulent drainage. 12/15/2022: Both ankle wounds measure smaller and shallower today. There is some slough accumulation, particularly on the left ankle, but the underlying granulation tissue appears quite robust and healthy. The buttock cavity is smaller. The tissue appears healthy. No concern for infection. Patient History Information obtained from Patient,  Caregiver, Chart. Family History Cancer - Father,Maternal Grandparents,Siblings,Child, Heart Disease - Father, Hypertension - Father, Thyroid Problems - Mother, No family history of Diabetes, Hereditary Spherocytosis, Kidney Disease, Lung Disease, Seizures, Stroke, Tuberculosis. Social History Former smoker - quit 40 yr ago, Marital Status - Widowed, Alcohol Use - Never, Drug Use - No History, Caffeine Use - Daily - tea, soda. Medical History Eyes Patient has history of Cataracts - bil extracted Denies history of Glaucoma, Optic Neuritis Cardiovascular Patient has history of Arrhythmia - afib, Hypertension Endocrine Denies history of Type I Diabetes, Type II Diabetes Genitourinary Denies history of End Stage Renal Disease Integumentary (Skin) Denies history of History of Burn Musculoskeletal Patient has history of Osteoarthritis, Osteomyelitis - pelvic Neurologic Patient has history of Neuropathy Oncologic LAKAN, EASTMAN Yu ([id number]) [id number] Page 9 of 13 Denies history of Received Chemotherapy, Received Radiation Psychiatric Patient has history of Confinement Anxiety Denies history of Anorexia/bulimia Hospitalization/Surgery History - TEE. - right ankle fusion. - bil knee replacements. - bil cataract extractions. - posterior lumbar fusion. - vaginal hysterectomy. - cholecystectomy. 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 Vitals Time Taken: 1:58 PM, Height: 59 in, Weight: 155 lbs, BMI: 31.3. Respiratory Normal work of breathing on room air.. General Notes: 12/15/2022: Both ankle wounds measure smaller and shallower today. There is some slough  accumulation, particularly on the left ankle, but the underlying granulation tissue appears quite robust and healthy. The buttock cavity is smaller. The tissue appears healthy. No concern for infection. Integumentary (Hair, Skin) Wound #1 status is Open. Original cause of wound was Pressure Injury. The date acquired was: 11/16/2021. The wound has been in treatment 55 weeks. The wound is located on the Right,Medial Malleolus. The wound measures 0.4cm length x 0.3cm width x 0.2cm depth; 0.094cm^2 area and 0.019cm^3 volume. There is Fat Layer (Subcutaneous Tissue) exposed. There is no tunneling or undermining noted. There is Yu medium amount of purulent  drainage noted. The wound margin is distinct with the outline attached to the wound base. There is small (1-33%) red, pink granulation within the wound bed. There is Yu large (67-100%) amount of necrotic tissue within the wound bed including Eschar and Adherent Slough. The periwound skin appearance had no abnormalities noted for moisture. The periwound skin appearance exhibited: Callus. The periwound skin appearance did not exhibit: Erythema. Periwound temperature was noted as No Abnormality. Wound #2 status is Open. Original cause of wound was Bump. The date acquired was: 09/28/2021. The wound has been in treatment 55 weeks. The wound is located on the Right Gluteus. The wound measures 1cm length x 1.5cm width x 3.2cm depth; 1.178cm^2 area and 3.77cm^3 volume. There is Fat Layer (Subcutaneous Tissue) exposed. There is no undermining noted, however, there is tunneling at 2:00 with Yu maximum distance of 3.2cm. There is Yu medium amount of serous drainage noted. The wound margin is epibole. There is large (67-100%) red granulation within the wound bed. There is no necrotic tissue within the wound bed. The periwound skin appearance had no abnormalities noted for texture. The periwound skin appearance had no abnormalities noted for moisture. The periwound skin  appearance had no abnormalities noted for color. Periwound temperature was noted as No Abnormality. The periwound has tenderness on palpation. Wound #4 status is Open. Original cause of wound was Pressure Injury. The date acquired was: 12/23/2021. The wound has been in treatment 49 weeks. The wound is located on the Left,Lateral Ankle. The wound measures 0.3cm length x 0.3cm width x 0.2cm depth; 0.071cm^2 area and 0.014cm^3 volume. There is Fat Layer (Subcutaneous Tissue) exposed. There is no tunneling or undermining noted. There is Yu medium amount of serous drainage noted. The wound margin is distinct with the outline attached to the wound base. There is medium (34-66%) pink granulation within the wound bed. There is Yu medium (34-66%) amount of necrotic tissue within the wound bed including Adherent Slough. The periwound skin appearance had no abnormalities noted for texture. The periwound skin appearance had no abnormalities noted for moisture. The periwound skin appearance did not exhibit: Rubor, Erythema. Periwound temperature was noted as No Abnormality. The periwound has tenderness on palpation. 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 There was Yu Excisional Skin/Subcutaneous Tissue Debridement with Yu total area of 0.09 sq cm performed by Duanne Guess, MD. With the following instrument(s): Curette to remove Viable and Non-Viable tissue/material. Material removed includes Subcutaneous  Tissue and Slough and after achieving pain control using Lidocaine 4% T opical Solution. No specimens were taken. Yu time out was conducted at 14:23, prior to the start of the procedure. Yu Minimum amount of bleeding was controlled with Pressure. The procedure was tolerated well. Post Debridement Measurements: 0.4cm length x 0.3cm width x 0.2cm depth; 0.019cm^3 volume. Post debridement Stage noted as Category/Stage III. Character of Wound/Ulcer Post Debridement requires further debridement. Post procedure Diagnosis Wound #1: Same as Pre-Procedure Wound #4 Pre-procedure diagnosis of Wound #4 is Yu Pressure Ulcer located on the Left,Lateral Ankle . There was Yu Excisional Skin/Subcutaneous Tissue Debridement with Yu total area of 0.07 sq  cm performed by Duanne Guess, MD. With the following instrument(s): Curette to remove Viable and Non-Viable tissue/material. Material removed includes Subcutaneous Tissue and Slough and after achieving pain control using Lidocaine 4% T opical Solution. No specimens were taken. Yu time out was conducted at 14:23, prior to the start of the procedure. Yu Minimum amount of bleeding was controlled with Pressure. The procedure was tolerated well. Post Debridement Measurements: 0.3cm length x 0.3cm width x 0.2cm depth; 0.014cm^3 volume. Post debridement Stage noted as Category/Stage IV. Character of Wound/Ulcer Post Debridement requires further debridement. Post procedure Diagnosis Wound #4: Same as Pre-Procedure Plan Follow-up Appointments: Return appointment in 3 weeks. - Dr. Lady Gary rm 1 Anesthetic: (In clinic) Topical Lidocaine 4% applied to wound bed Bathing/ Shower/ Hygiene: May shower and wash wound with soap and water. - with dressing changes Off-Loading: Multipodus Splint to: - prevalon boot to both feet Turn and reposition every 2 hours Home Health: No change in wound care orders this week; continue Home Health for wound care. May utilize formulary equivalent  dressing for wound treatment orders unless otherwise specified. Dressing changes to be completed by Home Health on Monday / Wednesday / Friday except when patient has scheduled visit at [facility]. Other Home Health Orders/Instructions: - [facility] WOUND #1: - Malleolus Wound Laterality: Right, Medial Cleanser: Normal Saline (Generic) 3 x Per Week/30 Days Discharge Instructions: Cleanse the wound with Normal Saline prior to applying Yu clean dressing using gauze sponges, not tissue or cotton balls. Peri-Wound Care: Skin Prep (DME) (Dispense As Written) 3 x Per Week/30 Days Discharge Instructions: Use skin prep as directed Prim Dressing: Promogran Prisma Matrix, 4.34 (sq in) (silver collagen) (Dispense As Written) 3 x Per Week/30 Days ary Discharge Instructions: Moisten collagen with saline or hydrogel Secondary Dressing: Optifoam Non-Adhesive Dressing, 4x4 in (Generic) 3 x Per Week/30 Days Discharge Instructions: Apply over primary dressing cut to make Yu foam donut Secondary Dressing: Woven Gauze Sponges 2x2 in (DME) (Dispense As Written) 3 x Per Week/30 Days Discharge Instructions: Apply over primary dressing as directed. Secured With: [employer], 4.5x3.1 (in/yd) 3 x Per Week/30 Days Discharge Instructions: Secure with Kerlix as directed. Secured With: 73M Medipore H Soft Cloth Surgical T ape, 4 x 10 (in/yd) (DME) (Dispense As Written) 3 x Per Week/30 Days Discharge Instructions: Secure with tape as directed. WOUND #2: - Gluteus Wound Laterality: Right Peri-Wound Care: Skin Prep (DME) (Dispense As Written) 1 x Per Day/30 Days Discharge Instructions: Use skin prep as directed Prim Dressing: Dakin's Solution 0.25%, 16 (oz) 1 x Per Day/30 Days ary Discharge Instructions: Moisten gauze with Dakin's solution Prim Dressing: Promogran Prisma Matrix, 4.34 (sq in) (silver collagen) (Dispense As Written) 1 x Per Day/30 Days ary Discharge Instructions: Moisten collagen with saline or  hydrogel Secondary Dressing: Zetuvit Plus Silicone Border Dressing 4x4 (in/in) (DME) (Dispense As Written) 1 x Per Day/30 Days Discharge Instructions: Apply silicone border over primary dressing as directed. WOUND #4: - Ankle Wound Laterality: Left, Lateral Cleanser: Normal Saline (Generic) 3 x Per Week/30 Days Discharge Instructions: Cleanse the wound with Normal Saline prior to applying Yu clean dressing using gauze sponges, not tissue or cotton balls. Peri-Wound Care: Skin Prep 3 x Per Week/30 Days Discharge Instructions: Use skin prep as directed Prim Dressing: Promogran Prisma Matrix, 4.34 (sq in) (silver collagen) (Dispense As Written) 3 x Per Week/30 Days ary Discharge Instructions: Moisten collagen with saline or hydrogel Secondary Dressing: Optifoam Non-Adhesive Dressing, 4x4 in (Generic) 3 x Per Week/30 Days Discharge  Instructions: Apply over primary dressing cut to make Yu foam donut Secondary Dressing: Woven Gauze Sponges 2x2 in (Generic) 3 x Per Week/30 Days Discharge Instructions: Apply over primary dressing as directed. Secured With: [employer], 4.5x3.1 (in/yd) 3 x Per Week/30 Days Discharge Instructions: Secure with Kerlix as directed. Secured With: 91M Medipore H Soft Cloth Surgical T ape, 4 x 10 (in/yd) (Generic) 3 x Per Week/30 Days Discharge Instructions: Secure with tape as directed. 12/15/2022: Both ankle wounds measure smaller and shallower today. There is some slough accumulation, particularly on the left ankle, but the underlying granulation tissue appears quite robust and healthy. The buttock cavity is smaller. The tissue appears healthy. No concern for infection. SHERICKA, SWAYZE Yu ([id number]) [id number] Page 11 of 13 I used Yu curette to debride slough and subcutaneous tissue from both ankle wounds. The buttock ulcer did not require any debridement. We will continue Prisma silver collagen to both ankles with foam doughnuts for  offloading. Will continue to place Prisma silver collagen from surface of the buttock ulcer and packed the cavity with Dakin's-moistened gauze. Due to clinic scheduling and provider availability, she will follow-up with me in about 3 weeks. Electronic Signature(s) Signed: 12/15/2022 4:36:51 PM By: Shawn Stall RN, BSN Signed: 12/16/2022 9:34:53 AM By: Duanne Guess MD FACS Previous Signature: 12/15/2022 2:33:57 PM Version By: Duanne Guess MD FACS Entered By: Shawn Stall on 12/15/2022 13:34:47 -------------------------------------------------------------------------------- HxROS Details Patient Name: Date of Service: Andrea Yu 12/15/2022 1:45 PM Medical Record Number: [id number] Patient Account Number: [id number] Date of Birth/Sex: Treating RN: November 11, 1940 (81 y.o. F) Primary Care Provider: Jarome Matin Other Clinician: Referring Provider: Treating Provider/Extender: Marena Chancy in Treatment: 55 Information Obtained From Patient Caregiver Chart Constitutional Symptoms (General Health) Medical History: Past Medical History Notes: morbid obesity Eyes Medical History: Positive for: Cataracts - bil extracted Negative for: Glaucoma; Optic Neuritis Ear/Nose/Mouth/Throat Medical History: Past Medical History Notes: hard of hearing Cardiovascular Medical History: Positive for: Arrhythmia - afib; Hypertension Gastrointestinal Medical History: Past Medical History Notes: GERD Endocrine Medical History: Negative for: Type I Diabetes; Type II Diabetes Past Medical History Notes: adrenal insufficiency Genitourinary Medical History: Negative for: End Stage Renal Disease Integumentary (Skin) Medical History: Negative for: History of Burn LARONICA, MCDANIELS Yu ([id number]) [id number] Page 12 of 13 Musculoskeletal Medical History: Positive for: Osteoarthritis; Osteomyelitis - pelvic Past Medical History  Notes: psoriatic arthritis, thoracic discitis, displaced spiral fx right tibia Neurologic Medical History: Positive for: Neuropathy Past Medical History Notes: hx [facility] Oncologic Medical History: Negative for: Received Chemotherapy; Received Radiation Psychiatric Medical History: Positive for: Confinement Anxiety Negative for: Anorexia/bulimia HBO Extended History Items Eyes: Cataracts Immunizations Pneumococcal Vaccine: Received Pneumococcal Vaccination: Yes Received Pneumococcal Vaccination On or After 60th Birthday: Yes Implantable Devices None Hospitalization / Surgery History Type of Hospitalization/Surgery TEE right ankle fusion bil knee replacements bil cataract extractions posterior lumbar fusion vaginal hysterectomy cholecystectomy Family and Social History Cancer: Yes - Father,Maternal Grandparents,Siblings,Child; Diabetes: No; Heart Disease: Yes - Father; Hereditary Spherocytosis: No; Hypertension: Yes - Father; Kidney Disease: No; Lung Disease: No; Seizures: No; Stroke: No; Thyroid Problems: Yes - Mother; Tuberculosis: No; Former smoker - quit 40 yr ago; Marital Status - Widowed; Alcohol Use: Never; Drug Use: No History; Caffeine Use: Daily - tea, soda; Financial Concerns: No; Food, Clothing or Shelter Needs: No; Support System Lacking: No; Transportation Concerns: No Electronic Signature(s) Signed: 12/15/2022 3:57:12 PM By: Duanne Guess MD FACS Entered By: Duanne Guess on 12/15/2022 11:32:31 -------------------------------------------------------------------------------- SuperBill Details Patient Name:  Date of Service: Andrea Yu 12/15/2022 Medical Record Number: [id number] Patient Account Number: [id number] Date of Birth/Sex: Treating RN: [birth year] (82 y.o. 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 Z79.52 Long term (current) use of systemic steroids I10 Essential (primary) hypertension Facility Procedures : CPT4 Code: [id number] Description: 11042 - DEB SUBQ TISSUE 20 SQ CM/< ICD-10 Diagnosis Description 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, stage 3 L89.523 Pressure ulcer of left ankle, stage 3 Modifier: Quantity: 1 Physician Procedures : CPT4 Code Description Modifier [id number] 99214 - WC PHYS LEVEL 4 - EST PT ICD-10 Diagnosis Description 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

## 2022-12-15 NOTE — Progress Notes (Signed)
MERILYN, FOBES A ([id number]) [id number] Page 1 of 11 Visit Report for 12/15/2022 Arrival Information Details Patient Name: Date of Service: Andrea Yu 12/15/2022 1:45 PM Medical Record Number: [id number] Patient Account Number: [id number] Date of Birth/Sex: Treating RN: 1941-01-19 (82 y.o. Roselee Nova, Jamie Primary Care Faylinn Schwenn: Jarome Matin Other Clinician: Referring Tawania Daponte: Treating Eddi Hymes/Extender: Marena Chancy in Treatment: 55 Visit Information History Since Last Visit Added or deleted any medications: No Patient Arrived: Wheel Chair Any new allergies or adverse reactions: No Arrival Time: 13:59 Had a fall or experienced change in No Accompanied By: caregiver activities of daily living that may affect Transfer Assistance: Manual risk of falls: Patient Requires Transmission-Based Precautions: No Signs or symptoms of abuse/neglect since last visito No Patient Has Alerts: No Hospitalized since last visit: No Implantable device outside of the clinic excluding No cellular tissue based products placed in the center since last visit: Has Dressing in Place as Prescribed: Yes Pain Present Now: No Electronic Signature(s) Signed: 12/15/2022 3:02:23 PM By: Tommie Ard RN Entered By: Tommie Ard on 12/15/2022 10:59:53 -------------------------------------------------------------------------------- Encounter Discharge Information Details Patient Name: Date of Service: Andrea Yu 12/15/2022 1:45 PM Medical Record Number: [id number] Patient Account Number: [id number] Date of Birth/Sex: Treating RN: Apr 08, 1940 (82 y.o. [name] Primary Care Areyana Leoni: Jarome Matin Other Clinician: Referring Hena Ewalt: Treating Katy Brickell/Extender: Marena Chancy in Treatment: 48 Encounter Discharge Information Items Post Procedure Vitals Discharge Condition: Stable Temperature (F):  98.0 Ambulatory Status: Walker Pulse (bpm): 90 Discharge Destination: Home Respiratory Rate (breaths/min): 17 Transportation: Private Auto Blood Pressure (mmHg): 152/73 Accompanied By: caregiver Schedule Follow-up Appointment: Yes Clinical Summary of Care: Electronic Signature(s) Signed: 12/15/2022 3:02:23 PM By: Tommie Ard RN Entered By: Tommie Ard on 12/15/2022 11:49:08 [name] ([id number]) [id number] Page 2 of 11 -------------------------------------------------------------------------------- Lower Extremity Assessment Details Patient Name: Date of Service: Andrea Yu 12/15/2022 1:45 PM Medical Record Number: [id number] Patient Account Number: [id number] Date of Birth/Sex: Treating RN: 07-12-1940 (82 y.o. [name] Primary Care Kealani Leckey: Jarome Matin Other Clinician: Referring Tyresha Fede: Treating Mayerly Kaman/Extender: Marena Chancy in Treatment: 55 Edema Assessment Assessed: [Left: No] [Right: No] Edema: [Left: No] [Right: No] Calf Left: Right: Point of Measurement: From Medial Instep 31 cm 39.5 cm Ankle Left: Right: Point of Measurement: From Medial Instep 18.4 cm 21.5 cm Vascular Assessment Pulses: Dorsalis Pedis Palpable: [Left:Yes] [Right:Yes] Extremity colors, hair growth, and conditions: Extremity Color: [Left:Hyperpigmented] [Right:Hyperpigmented] Hair Growth on Extremity: [Left:No] [Right:No] Temperature of Extremity: [Left:Warm < 3 seconds] [Right:Warm < 3 seconds] Electronic Signature(s) Signed: 12/15/2022 3:02:23 PM By: Tommie Ard RN Entered By: Tommie Ard on 12/15/2022 11:01:12 -------------------------------------------------------------------------------- Multi Wound Chart Details Patient Name: Date of Service: Andrea Yu 12/15/2022 1:45 PM Medical Record Number: [id number] Patient Account Number: [id number] Date of Birth/Sex: Treating RN: [birth year] (82 y.o.  F) Primary Care Rheta Hemmelgarn: Jarome Matin Other Clinician: Referring Salina Stanfield: Treating Westen Dinino/Extender: Marena Chancy in Treatment: 55 [1:Photos:] Right, Medial Malleolus Right Gluteus Left, Lateral Ankle Wound Location: Pressure Injury Bump Pressure Injury Wounding Event: KATERYNA, CUELLAR ([id number]) [id number] Page 3 of 11 Pressure Ulcer Abscess Pressure Ulcer Primary Etiology: Cataracts, Arrhythmia, Hypertension, Cataracts, Arrhythmia, Hypertension, Cataracts, Arrhythmia, Hypertension, Comorbid History: Osteoarthritis, Osteomyelitis, Osteoarthritis, Osteomyelitis, Osteoarthritis, Osteomyelitis, Neuropathy, Confinement Anxiety Neuropathy, Confinement Anxiety Neuropathy, Confinement Anxiety 11/16/2021 09/28/2021 12/23/2021 Date Acquired: 55 55 49 Weeks of Treatment: Open Open Open Wound Status: No No No Wound Recurrence: 0.4x0.3x0.2 1x1.5x3.2 0.3x0.3x0.2 Measurements  L x W x D (cm) 0.094 1.178 0.071 A (cm) : rea 0.019 3.77 0.014 Volume (cm) : 93.90% 88.70% 49.60% % Reduction in A rea: 87.70% 88.00% 0.00% % Reduction in Volume: 2 Position 1 (o'clock): 3.2 Maximum Distance 1 (cm): No Yes No Tunneling: Category/Stage III Full Thickness With Exposed Support Category/Stage IV Classification: Structures Medium Medium Medium Exudate A mount: Purulent Serous Serous Exudate Type: yellow, brown, green amber amber Exudate Color: Distinct, outline attached Epibole Distinct, outline attached Wound Margin: Small (1-33%) Large (67-100%) Medium (34-66%) Granulation A mount: Red, Pink Red Pink Granulation Quality: Large (67-100%) None Present (0%) Medium (34-66%) Necrotic A mount: Eschar, Adherent Slough N/A Adherent Slough Necrotic Tissue: Fat Layer (Subcutaneous Tissue): Yes Fat Layer (Subcutaneous Tissue): Yes Fat Layer (Subcutaneous Tissue): Yes Exposed Structures: Fascia: No Fascia: No Fascia: No Tendon:  No Tendon: No Tendon: No Muscle: No Muscle: No Muscle: No Joint: No Joint: No Joint: No Bone: No Bone: No Bone: No Small (1-33%) Medium (34-66%) Small (1-33%) Epithelialization: Debridement - Excisional N/A Debridement - Excisional Debridement: Pre-procedure Verification/Time Out 14:23 N/A 14:23 Taken: Lidocaine 4% Topical Solution N/A Lidocaine 4% Topical Solution Pain Control: Subcutaneous, Slough N/A Subcutaneous, Slough Tissue Debrided: Skin/Subcutaneous Tissue N/A Skin/Subcutaneous Tissue Level: 0.09 N/A 0.07 Debridement A (sq cm): rea Curette N/A Curette Instrument: Minimum N/A Minimum Bleeding: Pressure N/A Pressure Hemostasis A chieved: Procedure was tolerated well N/A Procedure was tolerated well Debridement Treatment Response: 0.4x0.3x0.2 N/A 0.3x0.3x0.2 Post Debridement Measurements L x W x D (cm) 0.019 N/A 0.014 Post Debridement Volume: (cm) Category/Stage III N/A Category/Stage IV Post Debridement Stage: Callus: Yes No Abnormalities Noted No Abnormalities Noted Periwound Skin Texture: Maceration: No No Abnormalities Noted Maceration: No Periwound Skin Moisture: Erythema: No Erythema: No Erythema: No Periwound Skin Color: Rubor: No No Abnormality No Abnormality No Abnormality Temperature: N/A Yes Yes Tenderness on Palpation: Debridement N/A Debridement Procedures Performed: Treatment Notes Wound #1 (Malleolus) Wound Laterality: Right, Medial Cleanser Normal Saline Discharge Instruction: Cleanse the wound with Normal Saline prior to applying a clean dressing using gauze sponges, not tissue or cotton balls. Peri-Wound Care Topical Primary Dressing Promogran Prisma Matrix, 4.34 (sq in) (silver collagen) Discharge Instruction: Moisten collagen with saline or hydrogel Secondary Dressing Optifoam Non-Adhesive Dressing, 4x4 in Discharge Instruction: Apply over primary dressing cut to make a foam donut Woven Gauze Sponges 2x2 in Discharge  Instruction: Apply over primary dressing as directed. Secured With [employer], 4.5x3.1 (in/yd) Discharge Instruction: Secure with Kerlix as directed. 76M Medipore H Soft Cloth Surgical Tape, 4 x 10 (in/yd) DINETTA, REDDIG A ([id number]) [id number].pdf Page 4 of 11 Discharge Instruction: Secure with tape as directed. Compression Wrap Compression Stockings Add-Ons Wound #2 (Gluteus) Wound Laterality: Right Cleanser Peri-Wound Care Topical Primary Dressing Dakin's Solution 0.25%, 16 (oz) Discharge Instruction: Moisten gauze with Dakin's solution Promogran Prisma Matrix, 4.34 (sq in) (silver collagen) Discharge Instruction: Moisten collagen with saline or hydrogel Secondary Dressing Zetuvit Plus Silicone Border Dressing 4x4 (in/in) Discharge Instruction: Apply silicone border over primary dressing as directed. Secured With Compression Wrap Compression Stockings Add-Ons Wound #4 (Ankle) Wound Laterality: Left, Lateral Cleanser Normal Saline Discharge Instruction: Cleanse the wound with Normal Saline prior to applying a clean dressing using gauze sponges, not tissue or cotton balls. Peri-Wound Care Topical Primary Dressing Promogran Prisma Matrix, 4.34 (sq in) (silver collagen) Discharge Instruction: Moisten collagen with saline or hydrogel Secondary Dressing Optifoam Non-Adhesive Dressing, 4x4 in Discharge Instruction: Apply over primary dressing cut to make a foam donut Woven Gauze Sponges 2x2 in  Discharge Instruction: Apply over primary dressing as directed. Secured With [employer], 4.5x3.1 (in/yd) Discharge Instruction: Secure with Kerlix as directed. 31M Medipore H Soft Cloth Surgical T ape, 4 x 10 (in/yd) Discharge Instruction: Secure with tape as directed. Compression Wrap Compression Stockings Add-Ons Electronic Signature(s) Signed: 12/15/2022 2:30:52 PM By: Duanne Guess MD FACS Entered By: Duanne Guess on 12/15/2022  11:30:52 [name] ([id number]) [id number] Page 5 of 11 -------------------------------------------------------------------------------- Multi-Disciplinary Care Plan Details Patient Name: Date of Service: Andrea Yu 12/15/2022 1:45 PM Medical Record Number: [id number] Patient Account Number: [id number] Date of Birth/Sex: Treating RN: [birth year] (82 y.o. Roselee Nova, Jamie Primary Care Trishelle Devora: Jarome Matin Other Clinician: Referring Xander Jutras: Treating Dreya Buhrman/Extender: Marena Chancy in Treatment: 80 Multidisciplinary Care Plan reviewed with physician Active Inactive Pressure Nursing Diagnoses: Knowledge deficit related to causes and risk factors for pressure ulcer development Knowledge deficit related to management of pressures ulcers Potential for impaired tissue integrity related to pressure, friction, moisture, and shear Goals: Patient/caregiver will verbalize understanding of pressure ulcer management Date Initiated: 11/24/2021 Target Resolution Date: 12/22/2022 Goal Status: Active Interventions: Assess: immobility, friction, shearing, incontinence upon admission and as needed Assess offloading mechanisms upon admission and as needed Assess potential for pressure ulcer upon admission and as needed Provide education on pressure ulcers Treatment Activities: Pressure reduction/relief device ordered : 11/24/2021 Notes: Wound/Skin Impairment Nursing Diagnoses: Impaired tissue integrity Knowledge deficit related to ulceration/compromised skin integrity Goals: Patient/caregiver will verbalize understanding of skin care regimen Date Initiated: 11/24/2021 Target Resolution Date: 12/22/2022 Goal Status: Active Ulcer/skin breakdown will have a volume reduction of 30% by week 4 Date Initiated: 11/24/2021 Date Inactivated: 05/31/2022 Target Resolution Date: 05/25/2022 Unmet Reason: refuses VAC, Goal Status:  Unmet noncompliant with offloading Interventions: Assess patient/caregiver ability to obtain necessary supplies Assess patient/caregiver ability to perform ulcer/skin care regimen upon admission and as needed Assess ulceration(s) every visit Provide education on ulcer and skin care Treatment Activities: Skin care regimen initiated : 11/24/2021 Topical wound management initiated : 11/24/2021 Notes: Electronic Signature(s) Signed: 12/15/2022 3:02:23 PM By: Tommie Ard RN Entered By: Tommie Ard on 12/15/2022 11:28:19 [name] ([id number]) [id number] Page 6 of 11 -------------------------------------------------------------------------------- Pain Assessment Details Patient Name: Date of Service: Andrea Yu 12/15/2022 1:45 PM Medical Record Number: [id number] Patient Account Number: [id number] Date of Birth/Sex: Treating RN: 19-Feb-1940 (82 y.o. [name] Primary Care Shahzain Kiester: Jarome Matin Other Clinician: Referring Ngoc Detjen: Treating Chidinma Clites/Extender: Marena Chancy in Treatment: 54 Active Problems Location of Pain Severity and Description of Pain Patient Has Paino No Site Locations Pain Management and Medication Current Pain Management: Electronic Signature(s) Signed: 12/15/2022 3:02:23 PM By: Tommie Ard RN Entered By: Tommie Ard on 12/15/2022 11:00:06 -------------------------------------------------------------------------------- Patient/Caregiver Education Details Patient Name: Date of Service: Andrea Yu 11/20/2024andnbsp1:45 PM Medical Record Number: [id number] Patient Account Number: [id number] Date of Birth/Gender: Treating RN: March 14, 1940 (82 y.o. [name] Primary Care Physician: Jarome Matin Other Clinician: Referring Physician: Treating Physician/Extender: Marena Chancy in Treatment: 53 Education Assessment Education Provided  To: Patient Education Topics Provided Wound Debridement: Methods: Explain/Verbal Responses: Reinforcements needed, State content correctly DASHANAE, LANIUS A ([id number]) [id number] Page 7 of 11 Wound/Skin Impairment: Methods: Explain/Verbal Responses: Reinforcements needed, State content correctly Electronic Signature(s) Signed: 12/15/2022 3:02:23 PM By: Tommie Ard RN Entered By: Tommie Ard on 12/15/2022 11:28:35 -------------------------------------------------------------------------------- Wound Assessment Details Patient Name: Date of Service: Andrea Yu 12/15/2022 1:45 PM Medical Record Number: [id number]  Patient Account Number: [id number] Date of Birth/Sex: Treating RN: [birth year] (82 y.o. Roselee Nova, Jamie Primary Care Chae Shuster: Jarome Matin Other Clinician: Referring Courtany Mcmurphy: Treating Glenyce Randle/Extender: Marena Chancy in Treatment: 55 Wound Status Wound Number: 1 Primary Pressure Ulcer Etiology: Wound Location: Right, Medial Malleolus Wound Open Wounding Event: Pressure Injury Status: Date Acquired: 11/16/2021 Comorbid Cataracts, Arrhythmia, Hypertension, Osteoarthritis, Weeks Of Treatment: 55 History: Osteomyelitis, Neuropathy, Confinement Anxiety Clustered Wound: No Photos Wound Measurements Length: (cm) 0.4 Width: (cm) 0.3 Depth: (cm) 0.2 Area: (cm) 0.094 Volume: (cm) 0.019 % Reduction in Area: 93.9% % Reduction in Volume: 87.7% Epithelialization: Small (1-33%) Tunneling: No Undermining: No Wound Description Classification: Category/Stage III Wound Margin: Distinct, outline attached Exudate Amount: Medium Exudate Type: Purulent Exudate Color: yellow, brown, green Foul Odor After Cleansing: No Slough/Fibrino Yes Wound Bed Granulation Amount: Small (1-33%) Exposed Structure Granulation Quality: Red, Pink Fascia Exposed: No Necrotic Amount: Large (67-100%) Fat Layer (Subcutaneous  Tissue) Exposed: Yes Necrotic Quality: Eschar, Adherent Slough Tendon Exposed: No Muscle Exposed: No Joint Exposed: No Bone Exposed: No Periwound Skin Texture Texture Color THEO, POLLINS A ([id number]) [id number] Page 8 of 11 No Abnormalities Noted: No No Abnormalities Noted: No Callus: Yes Erythema: No Moisture Temperature / Pain No Abnormalities Noted: Yes Temperature: No Abnormality Treatment Notes Wound #1 (Malleolus) Wound Laterality: Right, Medial Cleanser Normal Saline Discharge Instruction: Cleanse the wound with Normal Saline prior to applying a clean dressing using gauze sponges, not tissue or cotton balls. Peri-Wound Care Topical Primary Dressing Promogran Prisma Matrix, 4.34 (sq in) (silver collagen) Discharge Instruction: Moisten collagen with saline or hydrogel Secondary Dressing Optifoam Non-Adhesive Dressing, 4x4 in Discharge Instruction: Apply over primary dressing cut to make a foam donut Woven Gauze Sponges 2x2 in Discharge Instruction: Apply over primary dressing as directed. Secured With [employer], 4.5x3.1 (in/yd) Discharge Instruction: Secure with Kerlix as directed. 44M Medipore H Soft Cloth Surgical T ape, 4 x 10 (in/yd) Discharge Instruction: Secure with tape as directed. Compression Wrap Compression Stockings Add-Ons Electronic Signature(s) Signed: 12/15/2022 3:02:23 PM By: Tommie Ard RN Entered By: Tommie Ard on 12/15/2022 11:09:09 -------------------------------------------------------------------------------- Wound Assessment Details Patient Name: Date of Service: Andrea Yu 12/15/2022 1:45 PM Medical Record Number: [id number] Patient Account Number: [id number] Date of Birth/Sex: Treating RN: [birth year] (82 y.o. [name] Primary Care Michaell Grider: Jarome Matin Other Clinician: Referring Berma Harts: Treating Maevyn Riordan/Extender: Marena Chancy in Treatment:  55 Wound Status Wound Number: 2 Primary Abscess Etiology: Wound Location: Right Gluteus Wound Open Wounding Event: Bump Status: Date Acquired: 09/28/2021 Comorbid Cataracts, Arrhythmia, Hypertension, Osteoarthritis, Weeks Of Treatment: 55 History: Osteomyelitis, Neuropathy, Confinement Anxiety Clustered Wound: No Photos ANNISTYN, PALE A ([id number]) [id number] Page 9 of 11 Wound Measurements Length: (cm) 1 Width: (cm) 1.5 Depth: (cm) 3.2 Area: (cm) 1.178 Volume: (cm) 3.77 % Reduction in Area: 88.7% % Reduction in Volume: 88% Epithelialization: Medium (34-66%) Tunneling: Yes Position (o'clock): 2 Maximum Distance: (cm) 3.2 Undermining: No Wound Description Classification: Full Thickness With Exposed Suppo Wound Margin: Epibole Exudate Amount: Medium Exudate Type: Serous Exudate Color: amber rt Structures Foul Odor After Cleansing: No Slough/Fibrino No Wound Bed Granulation Amount: Large (67-100%) Exposed Structure Granulation Quality: Red Fascia Exposed: No Necrotic Amount: None Present (0%) Fat Layer (Subcutaneous Tissue) Exposed: Yes Tendon Exposed: No Muscle Exposed: No Joint Exposed: No Bone Exposed: No Periwound Skin Texture Texture Color No Abnormalities Noted: Yes No Abnormalities Noted: Yes Moisture Temperature / Pain No Abnormalities Noted: Yes Temperature: No Abnormality Tenderness on Palpation: Yes Treatment  Notes Wound #2 (Gluteus) Wound Laterality: Right Cleanser Peri-Wound Care Topical Primary Dressing Dakin's Solution 0.25%, 16 (oz) Discharge Instruction: Moisten gauze with Dakin's solution Promogran Prisma Matrix, 4.34 (sq in) (silver collagen) Discharge Instruction: Moisten collagen with saline or hydrogel Secondary Dressing Zetuvit Plus Silicone Border Dressing 4x4 (in/in) Discharge Instruction: Apply silicone border over primary dressing as directed. Secured With Compression Wrap Compression  Stockings Add-Ons Electronic Signature(s) JENNESIS, BLAISDELL A ([id number]) [id number] Page 10 of 11 Signed: 12/15/2022 3:02:23 PM By: Tommie Ard RN Entered By: Tommie Ard on 12/15/2022 11:15:19 -------------------------------------------------------------------------------- Wound Assessment Details Patient Name: Date of Service: Andrea Yu 12/15/2022 1:45 PM Medical Record Number: [id number] Patient Account Number: [id number] Date of Birth/Sex: Treating RN: May 24, 1940 (82 y.o. Roselee Nova, Jamie Primary Care Skylan Gift: Jarome Matin Other Clinician: Referring Jaylnn Ullery: Treating Cristian Grieves/Extender: Marena Chancy in Treatment: 55 Wound Status Wound Number: 4 Primary Pressure Ulcer Etiology: Wound Location: Left, Lateral Ankle Wound Open Wounding Event: Pressure Injury Status: Date Acquired: 12/23/2021 Comorbid Cataracts, Arrhythmia, Hypertension, Osteoarthritis, Weeks Of Treatment: 49 History: Osteomyelitis, Neuropathy, Confinement Anxiety Clustered Wound: No Photos Wound Measurements Length: (cm) 0.3 Width: (cm) 0.3 Depth: (cm) 0.2 Area: (cm) 0.071 Volume: (cm) 0.014 % Reduction in Area: 49.6% % Reduction in Volume: 0% Epithelialization: Small (1-33%) Tunneling: No Undermining: No Wound Description Classification: Category/Stage IV Wound Margin: Distinct, outline attached Exudate Amount: Medium Exudate Type: Serous Exudate Color: amber Foul Odor After Cleansing: No Slough/Fibrino Yes Wound Bed Granulation Amount: Medium (34-66%) Exposed Structure Granulation Quality: Pink Fascia Exposed: No Necrotic Amount: Medium (34-66%) Fat Layer (Subcutaneous Tissue) Exposed: Yes Necrotic Quality: Adherent Slough Tendon Exposed: No Muscle Exposed: No Joint Exposed: No Bone Exposed: No Periwound Skin Texture Texture Color No Abnormalities Noted: Yes No Abnormalities Noted: No Erythema: No Moisture Rubor:  No No Abnormalities Noted: Yes Temperature / Pain Temperature: No Abnormality Tenderness on Palpation: SHANESHA, WHITEHORSE A ([id number]) [id number] Page 11 of 11 Treatment Notes Wound #4 (Ankle) Wound Laterality: Left, Lateral Cleanser Normal Saline Discharge Instruction: Cleanse the wound with Normal Saline prior to applying a clean dressing using gauze sponges, not tissue or cotton balls. Peri-Wound Care Topical Primary Dressing Promogran Prisma Matrix, 4.34 (sq in) (silver collagen) Discharge Instruction: Moisten collagen with saline or hydrogel Secondary Dressing Optifoam Non-Adhesive Dressing, 4x4 in Discharge Instruction: Apply over primary dressing cut to make a foam donut Woven Gauze Sponges 2x2 in Discharge Instruction: Apply over primary dressing as directed. Secured With [employer], 4.5x3.1 (in/yd) Discharge Instruction: Secure with Kerlix as directed. 70M Medipore H Soft Cloth Surgical T ape, 4 x 10 (in/yd) Discharge Instruction: Secure with tape as directed. Compression Wrap Compression Stockings Add-Ons Electronic Signature(s) Signed: 12/15/2022 3:02:23 PM By: Tommie Ard RN Entered By: Tommie Ard on 12/15/2022 11:08:22 -------------------------------------------------------------------------------- Vitals Details Patient Name: Date of Service: Andrea Yu 12/15/2022 1:45 PM Medical Record Number: [id number] Patient Account Number: [id number] Date of Birth/Sex: Treating RN: 04/10/1940 (82 y.o. [name] Primary Care Reghan Thul: Jarome Matin Other Clinician: Referring Mayo Faulk: Treating Tenee Wish/Extender: Marena Chancy in Treatment: 55 Vital Signs Time Taken: 13:58 Reference Range: 80 - 120 mg / dl Height (in): 59 Weight (lbs): 155 Body Mass Index (BMI): 31.3 Electronic Signature(s) Signed: 12/15/2022 3:02:23 PM By: Tommie Ard RN Entered By: Tommie Ard on 12/15/2022  11:00:01

## 2023-01-13 ENCOUNTER — Encounter (HOSPITAL_BASED_OUTPATIENT_CLINIC_OR_DEPARTMENT_OTHER): Payer: Medicare Other | Attending: General Surgery | Admitting: General Surgery

## 2023-01-13 DIAGNOSIS — L89523 Pressure ulcer of left ankle, stage 3: Secondary | ICD-10-CM | POA: Insufficient documentation

## 2023-01-13 DIAGNOSIS — L89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, stage 3: Secondary | ICD-10-CM | POA: Diagnosis present

## 2023-01-13 DIAGNOSIS — L0231 Cutaneous abscess of buttock: Secondary | ICD-10-CM | POA: Insufficient documentation

## 2023-01-13 DIAGNOSIS — Z87891 Personal history of nicotine dependence: Secondary | ICD-10-CM | POA: Diagnosis not present

## 2023-01-13 DIAGNOSIS — L98415 Non-pressure chronic ulcer of buttock with muscle involvement without evidence of necrosis: Secondary | ICD-10-CM | POA: Diagnosis not present

## 2023-01-13 DIAGNOSIS — I10 Essential (primary) hypertension: Secondary | ICD-10-CM | POA: Diagnosis not present

## 2023-01-13 DIAGNOSIS — E2740 Unspecified adrenocortical insufficiency: Secondary | ICD-10-CM | POA: Diagnosis not present

## 2023-01-13 DIAGNOSIS — Z7952 Long term (current) use of systemic steroids: Secondary | ICD-10-CM | POA: Insufficient documentation

## 2023-01-15 NOTE — Progress Notes (Signed)
Andrea, Yu Yu ([id number]) [id number] Page 1 of 11 Visit Report for 01/13/2023 Chief Complaint Document Details Patient Name: Date of Service: Andrea Yu 01/13/2023 2:15 PM Medical Record Number: [id number] Patient Account Number: [id number] Date of Birth/Sex: Treating RN: 1940-06-07 (82 y.o. F) Primary Care Provider: Jarome Matin Other Clinician: Referring Provider: Treating Provider/Extender: Marena Chancy in Treatment: 69 Information Obtained from: Patient Chief Complaint Patient is at the clinic for treatment of an open pressure ulcer on her right medial ankle, and Yu large abscess on her right buttock Electronic Signature(s) Signed: 01/13/2023 3:20:30 PM By: Duanne Guess MD FACS Entered By: Duanne Guess on 01/13/2023 12:12:14 -------------------------------------------------------------------------------- Debridement Details Patient Name: Date of Service: Andrea Yu 01/13/2023 2:15 PM Medical Record Number: [id number] Patient Account Number: [id number] Date of Birth/Sex: Treating RN: Jun 10, 1940 (82 y.o. Andrea Yu Primary Care Provider: Jarome Matin Other Clinician: Referring Provider: Treating Provider/Extender: Marena Chancy in Treatment: 59 Debridement Performed for Assessment: Wound #1 Right,Medial Malleolus Performed By: Physician Duanne Guess, MD The following information was scribed by: Zenaida Deed The information was scribed for: Duanne Guess Debridement Type: Debridement Level of Consciousness (Pre-procedure): Awake and Alert Pre-procedure Verification/Time Out Yes - 14:55 Taken: Start Time: 14:56 Pain Control: Lidocaine 4% T opical Solution Percent of Wound Bed Debrided: 100% T Area Debrided (cm): otal 0.12 Tissue and other material debrided: Viable, Non-Viable, Eschar, Slough, Subcutaneous, Slough Level: Skin/Subcutaneous  Tissue Debridement Description: Excisional Instrument: Curette Bleeding: Minimum Hemostasis Achieved: Pressure Procedural Pain: 0 Post Procedural Pain: 0 Response to Treatment: Procedure was tolerated well Level of Consciousness (Post- Awake and Alert procedure): Post Debridement Measurements of Total Wound Length: (cm) 0.5 Stage: Category/Stage III Width: (cm) 0.3 Depth: (cm) 0.1 Volume: (cm) 0.012 Character of Wound/Ulcer Post Debridement: Improved Andrea, Yu Yu ([id number]) [id number] Page 2 of 11 Post Procedure Diagnosis Same as Pre-procedure Electronic Signature(s) Signed: 01/13/2023 3:20:30 PM By: Duanne Guess MD FACS Signed: 01/14/2023 12:52:17 PM By: Zenaida Deed RN, BSN Entered By: Zenaida Deed on 01/13/2023 11:59:41 -------------------------------------------------------------------------------- Debridement Details Patient Name: Date of Service: Andrea Yu 01/13/2023 2:15 PM Medical Record Number: [id number] Patient Account Number: [id number] Date of Birth/Sex: Treating RN: 02-04-1940 (82 y.o. Andrea Yu Primary Care Provider: Jarome Matin Other Clinician: Referring Provider: Treating Provider/Extender: Marena Chancy in Treatment: 59 Debridement Performed for Assessment: Wound #4 Left,Lateral Ankle Performed By: Physician Duanne Guess, MD The following information was scribed by: Zenaida Deed The information was scribed for: Duanne Guess Debridement Type: Debridement Level of Consciousness (Pre-procedure): Awake and Alert Pre-procedure Verification/Time Out Yes - 14:55 Taken: Start Time: 14:56 Pain Control: Lidocaine 4% T opical Solution Percent of Wound Bed Debrided: 100% T Area Debrided (cm): otal 0.07 Tissue and other material debrided: Viable, Non-Viable, Eschar, Slough, Subcutaneous, Slough Level: Skin/Subcutaneous Tissue Debridement Description:  Excisional Instrument: Curette Bleeding: Minimum Hemostasis Achieved: Pressure Procedural Pain: 0 Post Procedural Pain: 0 Response to Treatment: Procedure was tolerated well Level of Consciousness (Post- Awake and Alert procedure): Post Debridement Measurements of Total Wound Length: (cm) 0.3 Stage: Category/Stage IV Width: (cm) 0.3 Depth: (cm) 0.3 Volume: (cm) 0.021 Character of Wound/Ulcer Post Debridement: Improved Post Procedure Diagnosis Same as Pre-procedure Electronic Signature(s) Signed: 01/13/2023 3:20:30 PM By: Duanne Guess MD FACS Signed: 01/14/2023 12:52:17 PM By: Zenaida Deed RN, BSN Entered By: Zenaida Deed on 01/13/2023 12:00:26 HPI Details -------------------------------------------------------------------------------- Andrea Yu ([id number]) [id number] Page 3 of 11 Patient Name: Date of Service: Andrea  Andrea Yu 01/13/2023 2:15 PM Medical Record Number: [id number] Patient Account Number: [id number] Date of Birth/Sex: Treating RN: February 16, 1940 (82 y.o. 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 She is not diabetic and quit smoking over 40 years ago. 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 She has developed Yu pressure ulcer on the right medial malleolus. In addition, she apparently has had multiple cutaneous abscesses on her buttocks that have required repeated incision and drainage procedures. She has developed Yu large abscess on her right buttock that has become painful. She was recently treated with cephalexin by her PCP for urinary tract infection and also  received Yu shot of Rocephin for the abscess, but it has not yet been incised or drained. She is nonambulatory but is able to stand to transfer. She does not wear any sort of Prevalon boot. ABI in clinic today was 0.96. On her right medial malleolus, there is Yu circular ulcer. The surface is dry and fibrotic. There is some periwound erythema, but no malodor or purulent drainage. On her right buttock, there is Yu large fluctuant bulge about the size of an orange. It is also erythematous and tender. 12/04/2021: The culture from her abscess grew out staph. Yu 10-day course of Bactrim was prescribed and she is currently taking this. Unfortunately, Dakin's solution was not delivered and only 2 x 2 gauze was sent, rather than Kerlix so the packing of the wound has been rather suboptimal. The wound cavity has light slough over all of the surfaces. Although covered, bone is palpable. The medial ankle wound looks about the same with Yu layer of slough accumulation. In addition, her daughter peeled some dry skin off of her foot this morning and she now has denuded areas over the majority of the plantar surface of her right foot. 01/01/2022: The patient has missed several visits and to her daughters report that it is somewhat difficult to get her here on Yu weekly basis. In the interim, she has developed Yu new pressure ulcer on her left lateral malleolus. The plantar surface of her right foot has healed. The cavity on her right buttock has contracted, but it remains quite deep and the trochanter, while not exposed, is palpable at the base. The pressure ulcer on her right medial malleolus is basically unchanged. 02/09/2022: All wounds are little bit smaller today. As the buttock abscess cavity has contracted, it has left some tunneling that has not been adequately packed. Light slough accumulation on both ankle wounds. 02/26/2022: All of the wounds continue to contract. There is slough on both ankle wounds. The buttock  ulcer is clean. She brought her wound VAC with her today. 03/12/2022: The ankle wounds are about the same this week with slough accumulation. The buttock ulcer is smaller and quite clean. She decided that she could not tolerate the discomfort of the wound VAC and removed it. They have been packing the wound with Dakin's moistened gauze. 03/25/2022: The ankle wounds are slightly smaller. Both have slough accumulation. The orifice of the buttock ulcer has gotten quite Yu bit smaller than the underlying cavity. She has decided that she would like to have the wound VAC back. 3/14; patient presents for follow-up. She has  bilateral ankle wounds and has been using Hydrofera Blue to the these wound beds. She has Prevalon boots to help with offloading. She also has Yu sacral wound that we have been using Yu wound VAC for. She has no issues or complaints today. 04/29/2022: She once again decided she did not like the wound VAC and so they have been packing her gluteal wound with Dakin's moistened gauze. The gluteal ulcer seems to be about the same. Her left lateral ankle wound has gotten deeper and larger. The surface tissue is gray with thick slough. No significant change to the right medial ankle wound. 05/18/2022: The depth on the gluteal ulcer has come in Yu bit. There was Yu very strong pungent odor when the packing was removed, however. The right medial ankle wound is about the same size, but the tissue surface looks healthier. The left lateral ankle wound has reaccumulated the rubbery gray slough. 05/31/2022: The gluteal ulcer is shallower and no longer has any dips or crevices. The odor has abated completely. The right medial ankle wound is unchanged. The left lateral ankle wound looks quite Yu bit healthier with Yu cleaner surface and the beginnings of granulation tissue emergence. 06/16/2022: The gluteal ulcer has the same measurements more or less, but overall the volume seems smaller, as the cavity feels tighter on  examination. The ankle wounds are basically the same. 07/12/2022: The cavity of the buttock ulcer continues to contract. The left ankle wound has Yu little bit more granulation tissue, but bone remains exposed. The right ankle wound is flush with the surrounding skin. 07/21/2022: There is 1 deeper area in the buttock ulcer that I have not appreciated previously. I can feel bone under Yu layer of tissue. The rest of the cavity has contracted. Both ankle wounds are filling in with better granulation tissue. There is slough on both ankle wound surfaces. She struck her leg on her bed frame and has 2 new wounds on her right anterior tibial surface. Both have fat layer exposure but no concern for infection. 08/02/2022: I do not feel bone as easily in the buttock ulcer. There is slough on the surface. The cavity seems to be contracting. Both ankle wounds have improved tissue quality and there is granulation tissue nearly completely covering the bone on the left lateral malleolus. The wounds on her right anterior tibial surface have dried up. 08/10/2022: The cavity at the buttock ulcer has contracted further. The depth remains about the same. The surface is cleaner this week. Both ankle wounds are little bit smaller today. They have rubbery slough on the surface which appears to be leftover Oasis. Underneath, the quality of the tissue continues to improve and there is no longer any bone exposed on the left lateral malleolus. 08/16/2022: The buttock ulcer cavity has contracted further and she has less tenderness at the deepest aspect of the wound. Both ankle wounds look smaller today, although they did not measure any differently. Minimal slough accumulation with fairly healthy-looking granulation tissue present. 08/24/2022: The buttock ulcer cavity is smaller again today. She no longer has the tenderness at the deepest aspect of the wound. Both ankle wounds measured about Yu millimeter smaller. There is  healthy-looking granulation tissue present bilaterally with minimal slough on the left and no slough seen on the right. 08/31/2022: The cavity of the buttock ulcer continues to contract. It is about half Yu centimeter shallower than last week and there is no undermining. The cavity is also narrower and is more difficult to  get an index finger into the wound. Both ankle wounds measured slightly smaller. Both have Yu rather dry skin edge but good granulation tissue present with minimal slough. 09/08/2022: The buttock ulcer cavity is smaller again today. It is shallower and narrower. Both ankle wounds were Yu little bit smaller, as well. There is Yu fair amount of slough on the left lateral ankle wound. 09/14/2022: Unfortunately, the buttock ulcer cavity measured Yu little larger and Yu little deeper today. The patient did say she has been sitting Yu bit more of late. The right medial ankle wound is about the same size; the left lateral ankle wound is Yu little smaller and shallower. There is an area on the medial aspect of her Andrea Yu, Andrea Yu ([id number]) [phone].pdf Page 4 of 11 right calcaneus that looks concerning for potential pressure-induced deep tissue injury. 09/29/2022: Both ankle wounds measured smaller today. Underneath the dried-on Oasis and slough, there is actually fairly healthy-looking granulation tissue filling in. The gluteal ulcer is narrower and tighter, but the depth has not really decreased at all. 10/05/2022: Once again, both ankle wounds measured slightly smaller. The tissue surfaces look healthy. The gluteal ulcer continues to contract circumferentially, but the depth remains stable. 10/12/2022: The depth on the gluteal ulcer came in by about half Yu centimeter this week. Both ankles look about the same with rubbery slough overlying granulation tissue. The granulation tissue is not quite as pink and robust-appearing as on previous occasions. 10/28/2022: The gluteal  ulcer is shallower again this week without any tunneling. It continues to contract in all dimensions and the surface is quite clean. Both ankle wounds are little bit smaller today. They have slough on the surfaces, but the underlying granulation tissue is healthier-looking. 11/09/2022: The ankle wounds continue to contract and fill with granulation tissue. They are quite clean with minimal slough on the surfaces. The gluteal ulcer has skin growing down the sides of the orifice. It measured shallower today. It is clean. 11/29/2022: Both ankle wounds are slightly deeper today, but the overall diameter of each seems to be smaller. The quality of the tissue on the wound surface is quite good. There is slough and eschar on each of these. Her buttock ulcer continues to contract. The depth has come in by about half. Skin continues to grow down the sides of the cavity. No malodor or purulent drainage. 12/15/2022: Both ankle wounds measure smaller and shallower today. There is some slough accumulation, particularly on the left ankle, but the underlying granulation tissue appears quite robust and healthy. The buttock cavity is smaller. The tissue appears healthy. No concern for infection. 01/13/2023: The ankle wounds continue to contract, particularly the right medial wound. The buttock ulcer is epithelializing down the sides, creating Yu cone- shaped wound. It is about half Yu centimeter shallower than it was at her last visit. Electronic Signature(s) Signed: 01/13/2023 3:20:30 PM By: Duanne Guess MD FACS Entered By: Duanne Guess on 01/13/2023 12:13:17 -------------------------------------------------------------------------------- Physical Exam Details Patient Name: Date of Service: Andrea Yu 01/13/2023 2:15 PM Medical Record Number: [id number] Patient Account Number: [id number] Date of Birth/Sex: Treating RN: 01-Feb-1940 (82 y.o. F) Primary Care Provider: Jarome Matin Other  Clinician: Referring Provider: Treating Provider/Extender: Marena Chancy in Treatment: 59 Constitutional . . . . no acute distress. Respiratory Normal work of breathing on room air.. Notes 01/13/2023: The ankle wounds continue to contract, particularly the right medial wound. The buttock ulcer is epithelializing down the sides, creating Yu cone- shaped  wound. It is about half Yu centimeter shallower than it was at her last visit. Electronic Signature(s) Signed: 01/13/2023 3:20:30 PM By: Duanne Guess MD FACS Entered By: Duanne Guess on 01/13/2023 12:14:47 -------------------------------------------------------------------------------- Physician Orders Details Patient Name: Date of Service: Andrea Yu 01/13/2023 2:15 PM Medical Record Number: [id number] Patient Account Number: [id number] Date of Birth/Sex: Treating RN: 27-Aug-1940 (82 y.o. 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 - Dr. Lady Gary Return Yu Anesthetic (In clinic) Topical Lidocaine 4% applied to wound bed Bathing/ Shower/ Hygiene May shower and wash wound with soap and water. -  with dressing changes Off-Loading Multipodus Splint to: - prevalon boot to both feet Turn and reposition every 2 hours Home Health No change in wound care orders this week; continue Home Health for wound care. May utilize formulary equivalent dressing for wound treatment orders unless otherwise specified. Dressing changes to be completed by Home Health on Monday / Wednesday / Friday except when patient has scheduled visit at [facility]. Other Home Health Orders/Instructions: - [facility] Wound Treatment Wound #1 - Malleolus Wound Laterality: Right, Medial Cleanser: Normal Saline (DME) (Generic) 3 x Per Week/30 Days Discharge Instructions: Cleanse the wound with Normal Saline prior to applying Yu clean dressing using gauze sponges, not tissue or cotton balls. Andrea-Wound Care: Skin Prep (DME) (Generic) 3 x Per Week/30 Days Discharge Instructions: Use skin prep as directed Prim Dressing: Promogran Prisma Matrix, 4.34 (sq in) (silver collagen) (Dispense As Written) 3 x Per Week/30 Days ary Discharge Instructions: Moisten collagen with saline or hydrogel Secondary Dressing: Optifoam Non-Adhesive Dressing, 4x4 in (DME) (Generic) 3 x Per Week/30 Days Discharge Instructions: Apply over primary dressing cut to make Yu foam donut Secondary Dressing: Woven Gauze Sponges 2x2 in (Dispense As Written) 3 x Per Week/30 Days Discharge Instructions: Apply over primary dressing as directed. Secured With: [employer], 4.5x3.1 (in/yd) 3 x Per Week/30 Days Discharge Instructions: Secure with Kerlix as directed. Secured With: 70M Medipore H Soft Cloth Surgical T ape, 4 x 10 (in/yd) (Dispense As Written) 3 x Per Week/30 Days Discharge Instructions: Secure with tape as directed. Wound #2 - Gluteus Wound Laterality: Right Cleanser: Normal Saline 1 x Per Day/30 Days Discharge Instructions: Cleanse the wound with Normal Saline prior to applying Yu clean dressing using gauze sponges, not tissue or cotton  balls. Andrea-Wound Care: Skin Prep (DME) (Generic) 1 x Per Day/30 Days Discharge Instructions: Use skin prep as directed Prim Dressing: Dakin's Solution 0.25%, 16 (oz) (DME) (Generic) 1 x Per Day/30 Days ary Discharge Instructions: Moisten gauze with Dakin's solution Prim Dressing: Promogran Prisma Matrix, 4.34 (sq in) (silver collagen) (Dispense As Written) 1 x Per Day/30 Days ary Discharge Instructions: Moisten collagen with saline or hydrogel Secondary Dressing: Zetuvit Plus Silicone Border Dressing 4x4 (in/in) (DME) (Generic) 1 x Per Day/30 Days Discharge Instructions: Apply silicone border over primary dressing as directed.  Andrea Yu, Andrea Yu ([id number]) [id number] Page 6 of 11 Wound #4 - Ankle Wound Laterality: Left, Lateral Cleanser: Normal Saline (Generic) 3 x Per Week/30 Days Discharge Instructions: Cleanse the wound with Normal Saline prior to applying Yu clean dressing using gauze sponges, not tissue or cotton balls. Andrea-Wound Care: Skin Prep 3 x Per Week/30 Days Discharge Instructions: Use skin prep as directed Prim Dressing: Promogran Prisma Matrix, 4.34 (sq in) (silver collagen) (Dispense As Written) 3 x Per Week/30 Days ary Discharge Instructions: Moisten collagen with saline or hydrogel Secondary Dressing: Optifoam Non-Adhesive Dressing, 4x4 in (DME) (Generic) 3 x Per Week/30 Days Discharge Instructions: Apply over primary dressing cut to make Yu foam donut Secondary Dressing: Woven Gauze Sponges 2x2 in (Generic) 3 x Per Week/30 Days Discharge Instructions: Apply over primary dressing as directed. Secured With: [employer], 4.5x3.1 (in/yd) 3 x Per Week/30 Days Discharge Instructions: Secure with Kerlix as directed. Secured With: 34M Medipore H Soft Cloth Surgical T ape, 4 x 10 (in/yd) (DME) (Generic) 3 x Per Week/30 Days Discharge Instructions: Secure with tape as directed. Electronic Signature(s) Signed: 01/13/2023 3:20:30 PM By: Duanne Guess MD FACS Entered By: Duanne Guess on 01/13/2023 12:15:35 -------------------------------------------------------------------------------- Problem List Details Patient Name: Date of Service: Andrea Yu 01/13/2023 2:15 PM Medical Record Number: [id number] Patient Account Number: [id number] Date of Birth/Sex: Treating RN: Mar 14, 1940 (82 y.o. 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 without 01/01/2022 No Yes evidence of necrosis L02.31 Cutaneous abscess of buttock 11/24/2021 No Yes E27.40 Unspecified adrenocortical insufficiency 11/24/2021 No Yes Z79.52 Long term (current) use of systemic steroids 11/24/2021 No Yes Andrea Yu, Andrea Yu ([id number]) [phone].pdf Page 7 of 11 I10 Essential (primary) hypertension 11/24/2021 No Yes Inactive Problems ICD-10 Code Description Active Date Inactive Date L97.812 Non-pressure chronic ulcer of other part of right lower leg with fat layer exposed 07/21/2022 07/21/2022 Resolved Problems ICD-10 Code Description Active Date Resolved Date L97.511 Non-pressure chronic ulcer of other part of right foot limited to breakdown of skin 12/04/2021 12/04/2021 Electronic Signature(s) Signed: 01/13/2023 3:20:30 PM By: Duanne Guess MD FACS Entered By: Duanne Guess on 01/13/2023 12:08:12 -------------------------------------------------------------------------------- Progress Note Details Patient Name: Date of Service: Andrea Yu 01/13/2023 2:15 PM Medical Record Number: [id number] Patient Account  Number: [id number] Date of Birth/Sex: Treating RN: 1940-08-29 (82 y.o. 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 She is not diabetic and quit smoking over 40 years ago. 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 She has developed Yu pressure ulcer on the right medial malleolus. In addition, she apparently has had multiple cutaneous abscesses on her buttocks that have required repeated incision and drainage procedures. She has developed Yu large abscess on her right buttock  that has become painful. She was recently treated with cephalexin by her PCP for urinary tract infection and also received Yu shot of Rocephin for the abscess, but it has not yet been incised or drained. She is nonambulatory but is able to stand to transfer. She does not wear any sort of Prevalon boot. ABI in clinic today was 0.96. On her right medial malleolus, there is Yu circular ulcer. The surface is dry and fibrotic. There is some periwound erythema, but no malodor or purulent drainage. On her right buttock, there is Yu large fluctuant bulge about the size of an orange. It is also erythematous and tender. 12/04/2021: The culture from her abscess grew out staph. Yu 10-day course of Bactrim was prescribed and she is currently taking this. Unfortunately, Dakin's solution was not delivered and only 2 x 2 gauze was sent, rather than Kerlix so the packing of the wound has been rather suboptimal.  The wound cavity has light slough over all of the surfaces. Although covered, bone is palpable. The medial ankle wound looks about the same with Yu layer of slough accumulation. In addition, her daughter peeled some dry skin off of her foot this morning and she now has denuded areas over the majority of the plantar surface of her right foot. 01/01/2022: The patient has missed several visits and to her daughters report that it is somewhat difficult to get her here on Yu weekly basis. In the interim, she has developed Yu new pressure ulcer on her left lateral malleolus. The plantar surface of her right foot has healed. The cavity on her right buttock has contracted, but it remains quite deep and the trochanter, while not exposed, is palpable at the base. The pressure ulcer on her right medial malleolus is basically unchanged. 02/09/2022: All wounds are little bit smaller today. As the buttock abscess cavity has contracted, it has left some tunneling that has not been adequately packed. Light slough accumulation on both ankle wounds. 02/26/2022: All of the wounds continue to contract. There is slough on both ankle wounds. The buttock ulcer is clean. She brought her wound VAC with her today. 03/12/2022: The ankle wounds are about the same this week with slough accumulation. The buttock ulcer is smaller and quite clean. She decided that she could not tolerate the discomfort of the wound VAC and removed it. They have been packing the wound with Dakin's moistened gauze. Andrea Yu, Andrea Yu ([id number]) [id number] Page 8 of 11 03/25/2022: The ankle wounds are slightly smaller. Both have slough accumulation. The orifice of the buttock ulcer has gotten quite Yu bit smaller than the underlying cavity. She has decided that she would like to have the wound VAC back. 3/14; patient presents for follow-up. She has bilateral ankle wounds and has been using Hydrofera Blue to the these wound beds. She has  Prevalon boots to help with offloading. She also has Yu sacral wound that we have been using Yu wound VAC for. She has no issues or complaints today. 04/29/2022: She once again decided she did not like the wound VAC and so they have been packing her gluteal wound with Dakin's moistened gauze. The gluteal ulcer seems to be about the same. Her left lateral ankle wound has gotten deeper and larger. The surface tissue is gray with thick slough. No significant change to the right medial ankle wound. 05/18/2022: The depth on the gluteal ulcer has come in Yu bit. There was Yu very strong pungent odor when the packing was removed, however.  The right medial ankle wound is about the same size, but the tissue surface looks healthier. The left lateral ankle wound has reaccumulated the rubbery gray slough. 05/31/2022: The gluteal ulcer is shallower and no longer has any dips or crevices. The odor has abated completely. The right medial ankle wound is unchanged. The left lateral ankle wound looks quite Yu bit healthier with Yu cleaner surface and the beginnings of granulation tissue emergence. 06/16/2022: The gluteal ulcer has the same measurements more or less, but overall the volume seems smaller, as the cavity feels tighter on examination. The ankle wounds are basically the same. 07/12/2022: The cavity of the buttock ulcer continues to contract. The left ankle wound has Yu little bit more granulation tissue, but bone remains exposed. The right ankle wound is flush with the surrounding skin. 07/21/2022: There is 1 deeper area in the buttock ulcer that I have not appreciated previously. I can feel bone under Yu layer of tissue. The rest of the cavity has contracted. Both ankle wounds are filling in with better granulation tissue. There is slough on both ankle wound surfaces. She struck her leg on her bed frame and has 2 new wounds on her right anterior tibial surface. Both have fat layer exposure but no concern for  infection. 08/02/2022: I do not feel bone as easily in the buttock ulcer. There is slough on the surface. The cavity seems to be contracting. Both ankle wounds have improved tissue quality and there is granulation tissue nearly completely covering the bone on the left lateral malleolus. The wounds on her right anterior tibial surface have dried up. 08/10/2022: The cavity at the buttock ulcer has contracted further. The depth remains about the same. The surface is cleaner this week. Both ankle wounds are little bit smaller today. They have rubbery slough on the surface which appears to be leftover Oasis. Underneath, the quality of the tissue continues to improve and there is no longer any bone exposed on the left lateral malleolus. 08/16/2022: The buttock ulcer cavity has contracted further and she has less tenderness at the deepest aspect of the wound. Both ankle wounds look smaller today, although they did not measure any differently. Minimal slough accumulation with fairly healthy-looking granulation tissue present. 08/24/2022: The buttock ulcer cavity is smaller again today. She no longer has the tenderness at the deepest aspect of the wound. Both ankle wounds measured about Yu millimeter smaller. There is healthy-looking granulation tissue present bilaterally with minimal slough on the left and no slough seen on the right. 08/31/2022: The cavity of the buttock ulcer continues to contract. It is about half Yu centimeter shallower than last week and there is no undermining. The cavity is also narrower and is more difficult to get an index finger into the wound. Both ankle wounds measured slightly smaller. Both have Yu rather dry skin edge but good granulation tissue present with minimal slough. 09/08/2022: The buttock ulcer cavity is smaller again today. It is shallower and narrower. Both ankle wounds were Yu little bit smaller, as well. There is Yu fair amount of slough on the left lateral ankle  wound. 09/14/2022: Unfortunately, the buttock ulcer cavity measured Yu little larger and Yu little deeper today. The patient did say she has been sitting Yu bit more of late. The right medial ankle wound is about the same size; the left lateral ankle wound is Yu little smaller and shallower. There is an area on the medial aspect of her right calcaneus that looks concerning for  potential pressure-induced deep tissue injury. 09/29/2022: Both ankle wounds measured smaller today. Underneath the dried-on Oasis and slough, there is actually fairly healthy-looking granulation tissue filling in. The gluteal ulcer is narrower and tighter, but the depth has not really decreased at all. 10/05/2022: Once again, both ankle wounds measured slightly smaller. The tissue surfaces look healthy. The gluteal ulcer continues to contract circumferentially, but the depth remains stable. 10/12/2022: The depth on the gluteal ulcer came in by about half Yu centimeter this week. Both ankles look about the same with rubbery slough overlying granulation tissue. The granulation tissue is not quite as pink and robust-appearing as on previous occasions. 10/28/2022: The gluteal ulcer is shallower again this week without any tunneling. It continues to contract in all dimensions and the surface is quite clean. Both ankle wounds are little bit smaller today. They have slough on the surfaces, but the underlying granulation tissue is healthier-looking. 11/09/2022: The ankle wounds continue to contract and fill with granulation tissue. They are quite clean with minimal slough on the surfaces. The gluteal ulcer has skin growing down the sides of the orifice. It measured shallower today. It is clean. 11/29/2022: Both ankle wounds are slightly deeper today, but the overall diameter of each seems to be smaller. The quality of the tissue on the wound surface is quite good. There is slough and eschar on each of these. Her buttock ulcer continues to  contract. The depth has come in by about half. Skin continues to grow down the sides of the cavity. No malodor or purulent drainage. 12/15/2022: Both ankle wounds measure smaller and shallower today. There is some slough accumulation, particularly on the left ankle, but the underlying granulation tissue appears quite robust and healthy. The buttock cavity is smaller. The tissue appears healthy. No concern for infection. 01/13/2023: The ankle wounds continue to contract, particularly the right medial wound. The buttock ulcer is epithelializing down the sides, creating Yu cone- shaped wound. It is about half Yu centimeter shallower than it was at her last visit. Objective Constitutional no acute distress. Vitals Time Taken: 2:24 AM, Height: 59 in, Weight: 155 lbs, BMI: 31.3, Temperature: 97.8 F, Pulse: 76 bpm, Respiratory Rate: 18 breaths/min, Blood Pressure: Andrea Yu, Andrea Yu ([id number]) [phone].pdf Page 9 of 11 132/72 mmHg. Respiratory Normal work of breathing on room air.. General Notes: 01/13/2023: The ankle wounds continue to contract, particularly the right medial wound. The buttock ulcer is epithelializing down the sides, creating Yu cone-shaped wound. It is about half Yu centimeter shallower than it was at her last visit. Integumentary (Hair, Skin) Wound #1 status is Open. Original cause of wound was Pressure Injury. The date acquired was: 11/16/2021. The wound has been in treatment 59 weeks. The wound is located on the Right,Medial Malleolus. The wound measures 0.5cm length x 0.3cm width x 0.1cm depth; 0.118cm^2 area and 0.012cm^3 volume. There is Fat Layer (Subcutaneous Tissue) exposed. There is no tunneling or undermining noted. There is Yu small amount of serous drainage noted. The wound margin is distinct with the outline attached to the wound base. There is large (67-100%) red, pink granulation within the wound bed. There is Yu small (1-33%) amount of necrotic  tissue within the wound bed including Adherent Slough. The periwound skin appearance had no abnormalities noted for moisture. The periwound skin appearance exhibited: Callus, Erythema. The surrounding wound skin color is noted with erythema. Erythema is measured at 1 cm. Periwound temperature was noted as No Abnormality. Wound #2 status is Open. Original cause of  wound was Bump. The date acquired was: 09/28/2021. The wound has been in treatment 59 weeks. The wound is located on the Right Gluteus. The wound measures 0.2cm length x 0.6cm width x 2.6cm depth; 0.094cm^2 area and 0.245cm^3 volume. There is Fat Layer (Subcutaneous Tissue) exposed. There is no tunneling or undermining noted. There is Yu medium amount of serosanguineous drainage noted. The wound margin is distinct with the outline attached to the wound base. There is no granulation within the wound bed. There is no necrotic tissue within the wound bed. The periwound skin appearance had no abnormalities noted for texture. The periwound skin appearance had no abnormalities noted for moisture. The periwound skin appearance had no abnormalities noted for color. Periwound temperature was noted as No Abnormality. The periwound has tenderness on palpation. General Notes: unable to visualize base of the wound Wound #4 status is Open. Original cause of wound was Pressure Injury. The date acquired was: 12/23/2021. The wound has been in treatment 53 weeks. The wound is located on the Left,Lateral Ankle. The wound measures 0.3cm length x 0.3cm width x 0.3cm depth; 0.071cm^2 area and 0.021cm^3 volume. There is Fat Layer (Subcutaneous Tissue) exposed. There is no tunneling noted, however, there is undermining starting at 12:00 and ending at 12:00 with Yu maximum distance of 0.3cm. There is Yu medium amount of serous drainage noted. The wound margin is epibole. There is medium (34-66%) pink granulation within the wound bed. There is Yu medium (34-66%) amount of  necrotic tissue within the wound bed including Adherent Slough. The periwound skin appearance had no abnormalities noted for texture. The periwound skin appearance had no abnormalities noted for moisture. The periwound skin appearance did not exhibit: Rubor, Erythema. Periwound temperature was noted as No Abnormality. The periwound has tenderness on palpation. 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 There was Yu Excisional Skin/Subcutaneous Tissue Debridement with Yu total area of 0.12 sq cm performed by Duanne Guess, MD. With the following instrument(s): Curette to remove Viable and Non-Viable tissue/material. Material removed includes Eschar, Subcutaneous Tissue, and Slough after achieving pain control using Lidocaine 4% T opical Solution. No specimens were taken. Yu time out was conducted at 14:55, prior to the start of the procedure. Yu Minimum amount of bleeding was controlled with Pressure. The procedure was tolerated well with Yu pain level of 0 throughout and Yu pain level of 0 following the procedure. Post Debridement Measurements: 0.5cm length x 0.3cm width x 0.1cm depth; 0.012cm^3 volume. Post debridement Stage noted as Category/Stage III. Character of Wound/Ulcer Post Debridement is improved. Post procedure Diagnosis Wound #1: Same as Pre-Procedure Wound #4 Pre-procedure diagnosis of Wound #4 is Yu Pressure Ulcer located on the Left,Lateral Ankle . There was Yu Excisional Skin/Subcutaneous Tissue Debridement with Yu total area of 0.07 sq cm performed by Duanne Guess, MD. With the following instrument(s): Curette to remove Viable and Non-Viable tissue/material. Material removed includes Eschar, Subcutaneous Tissue, and Slough after achieving pain control using Lidocaine 4% T opical Solution. No specimens were taken. Yu time out was conducted at 14:55, prior to the start of the procedure. Yu Minimum amount of bleeding was controlled with Pressure. The procedure was  tolerated well with Yu pain level of 0 throughout and Yu pain level of 0 following the procedure. Post Debridement Measurements: 0.3cm length x 0.3cm width x 0.3cm depth; 0.021cm^3 volume. Post debridement Stage noted as Category/Stage IV. Character of Wound/Ulcer Post Debridement is improved. Post procedure Diagnosis Wound #4: Same as Pre-Procedure Plan Follow-up Appointments: Return Appointment in 2 weeks. - Dr. Rhea Belton, [place] ([id number]) [id number] Page 10 of 11 Anesthetic: (In clinic) Topical Lidocaine 4% applied to wound bed Bathing/ Shower/ Hygiene: May shower and wash wound with soap and water. - with dressing changes Off-Loading: Multipodus Splint to: - prevalon boot to both feet Turn and reposition every 2 hours Home Health: No change in wound care orders this week; continue Home Health for wound care. May utilize formulary equivalent dressing for wound treatment orders unless otherwise specified. Dressing changes to be completed by Home Health on Monday / Wednesday / Friday except when patient has scheduled visit at [facility]. Other Home Health Orders/Instructions: - [facility] WOUND #1: - Malleolus Wound Laterality: Right, Medial Cleanser: Normal Saline (DME) (Generic) 3 x Per Week/30 Days Discharge Instructions: Cleanse the wound with Normal Saline prior to applying Yu clean dressing using gauze sponges, not tissue or cotton balls. Andrea-Wound Care: Skin Prep (DME) (Generic) 3 x Per Week/30 Days Discharge Instructions: Use skin prep as  directed Prim Dressing: Promogran Prisma Matrix, 4.34 (sq in) (silver collagen) (Dispense As Written) 3 x Per Week/30 Days ary Discharge Instructions: Moisten collagen with saline or hydrogel Secondary Dressing: Optifoam Non-Adhesive Dressing, 4x4 in (DME) (Generic) 3 x Per Week/30 Days Discharge Instructions: Apply over primary dressing cut to make Yu foam donut Secondary Dressing: Woven Gauze Sponges 2x2 in (Dispense As Written) 3 x Per Week/30 Days Discharge Instructions: Apply over primary dressing as directed. Secured With: [employer], 4.5x3.1 (in/yd) 3 x Per Week/30 Days Discharge Instructions: Secure with Kerlix as directed. Secured With: 50M Medipore H Soft Cloth Surgical T ape, 4 x 10 (in/yd) (Dispense As Written) 3 x Per Week/30 Days Discharge Instructions: Secure with tape as directed. WOUND #2: - Gluteus Wound Laterality: Right Cleanser: Normal Saline 1 x Per Day/30 Days Discharge Instructions: Cleanse the wound with Normal Saline prior to applying Yu clean dressing using gauze sponges, not tissue or cotton balls. Andrea-Wound Care: Skin Prep (DME) (Generic) 1 x Per Day/30 Days Discharge Instructions: Use skin prep as directed Prim Dressing: Dakin's Solution 0.25%, 16 (oz) (DME) (Generic) 1 x Per Day/30 Days ary Discharge Instructions: Moisten gauze with Dakin's solution Prim Dressing: Promogran Prisma Matrix, 4.34 (sq in) (silver collagen) (Dispense As Written) 1 x Per Day/30 Days ary Discharge Instructions: Moisten collagen with saline or hydrogel Secondary Dressing: Zetuvit Plus Silicone Border Dressing 4x4 (in/in) (DME) (Generic) 1 x Per Day/30 Days Discharge Instructions: Apply silicone border over primary dressing as directed. WOUND #4: - Ankle Wound Laterality: Left, Lateral Cleanser: Normal Saline (Generic) 3 x Per Week/30 Days Discharge Instructions: Cleanse the wound with Normal Saline prior to applying Yu clean dressing using gauze sponges, not tissue or cotton  balls. Andrea-Wound Care: Skin Prep 3 x Per Week/30 Days Discharge Instructions: Use skin prep as directed Prim Dressing: Promogran Prisma Matrix, 4.34 (sq in) (silver collagen) (Dispense As Written) 3 x Per Week/30 Days ary Discharge Instructions: Moisten collagen with saline or hydrogel Secondary Dressing: Optifoam Non-Adhesive Dressing, 4x4 in (DME) (Generic) 3 x Per Week/30 Days Discharge Instructions: Apply over primary dressing cut to make Yu foam donut Secondary Dressing: Woven Gauze Sponges  2x2 in (Generic) 3 x Per Week/30 Days Discharge Instructions: Apply over primary dressing as directed. Secured With: [employer], 4.5x3.1 (in/yd) 3 x Per Week/30 Days Discharge Instructions: Secure with Kerlix as directed. Secured With: 55M Medipore H Soft Cloth Surgical T ape, 4 x 10 (in/yd) (DME) (Generic) 3 x Per Week/30 Days Discharge Instructions: Secure with tape as directed. 01/13/2023: The ankle wounds continue to contract, particularly the right medial wound. The buttock ulcer is epithelializing down the sides, creating Yu cone- shaped wound. It is about half Yu centimeter shallower than it was at her last visit. I used Yu curette to debride eschar, slough, and subcutaneous tissue from each of the ankle wounds. The gluteal ulcer did not require any debridement. We will continue Prisma silver collagen to both ankle wounds with foam doughnuts cut out to minimize pressure and friction in the areas. She is also using Prevalon boots for this, as well. The gluteal ulcer will continue to be packed with Dakin's-moistened gauze. Continue offloading and adequate protein intake. Follow-up in 2 weeks. Electronic Signature(s) Signed: 01/13/2023 3:20:30 PM By: Duanne Guess MD FACS Entered By: Duanne Guess on 01/13/2023 12:16:56 -------------------------------------------------------------------------------- SuperBill Details Patient Name: Date of Service: Andrea Yu 01/13/2023 Medical  Record Number: [id number] Patient Account Number: [id number] Date of Birth/Sex: Treating RN: 11/10/1940 (82 y.o. 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 L02.31 Cutaneous abscess of buttock E27.40 Unspecified adrenocortical insufficiency Z79.52 Long term (current) use of systemic steroids I10 Essential (primary) hypertension Facility Procedures : CPT4 Code: [id number] Description: 11042 - DEB SUBQ TISSUE 20 SQ CM/< ICD-10 Diagnosis Description 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, stage 3 L89.523 Pressure ulcer of left ankle, stage 3 Modifier: Quantity: 1 Physician Procedures : CPT4 Code Description Modifier [id number] - WC PHYS LEVEL 4 - EST PT ICD-10 Diagnosis Description 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, stage 3 L89.523 Pressure ulcer of left ankle, stage 3 L98.415 Non-pressure chronic ulcer of buttock with muscle  involvement without evidence of necrosis Z79.52 Long term (current) use of systemic steroids Quantity: 1 : [id number] 11042 - WC PHYS SUBQ TISS 20 SQ CM ICD-10 Diagnosis Description 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, stage 3 L89.523 Pressure ulcer of left ankle, stage 3 Quantity: 1 Electronic Signature(s) Signed: 01/13/2023 3:20:30 PM By: Duanne Guess MD FACS Entered By: Duanne Guess on 01/13/2023 12:17:13

## 2023-01-15 NOTE — Progress Notes (Signed)
ADARA, HAUTH A ([id number]) [id number] Page 1 of 11 Visit Report for 01/13/2023 Arrival Information Details Patient Name: Date of Service: Andrea Yu 01/13/2023 2:15 PM Medical Record Number: [id number] Patient Account Number: [id number] Date of Birth/Sex: Treating RN: [birth year] (82 y.o. F) Primary Care Almin Livingstone: Jarome Matin Other Clinician: Referring Ashlay Altieri: Treating Ajani Rineer/Extender: Marena Chancy in Treatment: 26 Visit Information History Since Last Visit Added or deleted any medications: No Patient Arrived: Wheel Chair Any new allergies or adverse reactions: No Arrival Time: 14:22 Had a fall or experienced change in No Accompanied By: caregiver activities of daily living that may affect Transfer Assistance: None risk of falls: Patient Identification Verified: Yes Signs or symptoms of abuse/neglect since last visito No Secondary Verification Process Completed: Yes Hospitalized since last visit: No Patient Requires Transmission-Based Precautions: No Implantable device outside of the clinic excluding No Patient Has Alerts: No cellular tissue based products placed in the center since last visit: Has Dressing in Place as Prescribed: Yes Pain Present Now: No Electronic Signature(s) Signed: 01/14/2023 12:52:17 PM By: Zenaida Deed RN, BSN Entered By: Zenaida Deed on 01/13/2023 11:40:10 -------------------------------------------------------------------------------- Encounter Discharge Information Details Patient Name: Date of Service: Andrea Yu 01/13/2023 2:15 PM Medical Record Number: [id number] Patient Account Number: [id number] Date of Birth/Sex: Treating RN: 1940-05-31 (82 y.o. [name] Primary Care Nashika Coker: Jarome Matin Other Clinician: Referring Chassity Ludke: Treating Reade Trefz/Extender: Marena Chancy in Treatment: 4 Encounter Discharge Information  Items Post Procedure Vitals Discharge Condition: Stable Temperature (F): 97.8 Ambulatory Status: Wheelchair Pulse (bpm): 76 Discharge Destination: Home Respiratory Rate (breaths/min): 18 Transportation: Private Auto Blood Pressure (mmHg): 132/72 Accompanied By: caregiver Schedule Follow-up Appointment: Yes Clinical Summary of Care: Patient Declined Electronic Signature(s) Signed: 01/14/2023 12:52:17 PM By: Zenaida Deed RN, BSN Entered By: Zenaida Deed on 01/13/2023 14:25:02 [name] ([id number]) [id number] Page 2 of 11 -------------------------------------------------------------------------------- Lower Extremity Assessment Details Patient Name: Date of Service: Andrea Yu 01/13/2023 2:15 PM Medical Record Number: [id number] Patient Account Number: [id number] Date of Birth/Sex: Treating RN: 25-Nov-1940 (82 y.o. [name] Primary Care Jodiann Ognibene: Jarome Matin Other Clinician: Referring Oluwatimileyin Vivier: Treating Tram Wrenn/Extender: Marena Chancy in Treatment: 59 Edema Assessment Assessed: [Left: No] [Right: No] Edema: [Left: No] [Right: No] Calf Left: Right: Point of Measurement: From Medial Instep 31 cm 39.5 cm Ankle Left: Right: Point of Measurement: From Medial Instep 18.4 cm 21.5 cm Vascular Assessment Pulses: Dorsalis Pedis Palpable: [Left:No] [Right:Yes] Extremity colors, hair growth, and conditions: Extremity Color: [Left:Hyperpigmented] [Right:Hyperpigmented] Hair Growth on Extremity: [Left:No] [Right:No] Temperature of Extremity: [Left:Warm < 3 seconds] [Right:Warm < 3 seconds] Electronic Signature(s) Signed: 01/14/2023 12:52:17 PM By: Zenaida Deed RN, BSN Entered By: Zenaida Deed on 01/13/2023 11:42:35 -------------------------------------------------------------------------------- Multi Wound Chart Details Patient Name: Date of Service: Andrea Yu 01/13/2023 2:15  PM Medical Record Number: [id number] Patient Account Number: [id number] Date of Birth/Sex: Treating RN: 1940/05/12 (82 y.o. F) Primary Care Wafa Martes: Jarome Matin Other Clinician: Referring Tandrea Kommer: Treating Kinzly Pierrelouis/Extender: Marena Chancy in Treatment: 59 Vital Signs Height(in): 59 Pulse(bpm): 76 Weight(lbs): 155 Blood Pressure(mmHg): 132/72 Body Mass Index(BMI): 31.3 Temperature(F): 97.8 Respiratory Rate(breaths/min): 18 [1:Photos:] [[id number] Page 3 of 11] Right, Medial Malleolus Right Gluteus Left, Lateral Ankle Wound Location: Pressure Injury Bump Pressure Injury Wounding Event: Pressure Ulcer Abscess Pressure Ulcer Primary Etiology: Cataracts, Arrhythmia, Hypertension, Cataracts, Arrhythmia, Hypertension, Cataracts, Arrhythmia, Hypertension, Comorbid History: Osteoarthritis, Osteomyelitis, Osteoarthritis, Osteomyelitis, Osteoarthritis, Osteomyelitis, Neuropathy, Confinement Anxiety Neuropathy, Confinement Anxiety  Neuropathy, Confinement Anxiety 11/16/2021 09/28/2021 12/23/2021 Date Acquired: 59 59 53 Weeks of Treatment: Open Open Open Wound Status: No No No Wound Recurrence: 0.5x0.3x0.1 0.2x0.6x2.6 0.3x0.3x0.3 Measurements L x W x D (cm) 0.118 0.094 0.071 A (cm) : rea 0.012 0.245 0.021 Volume (cm) : 92.30% 99.10% 49.60% % Reduction in A rea: 92.20% 99.20% -50.00% % Reduction in Volume: 12 Starting Position 1 (o'clock): 12 Ending Position 1 (o'clock): 0.3 Maximum Distance 1 (cm): No No Yes Undermining: Category/Stage III Full Thickness With Exposed Support Category/Stage IV Classification: Structures Small Medium Medium Exudate A mount: Serous Serosanguineous Serous Exudate Type: amber red, brown amber Exudate Color: Distinct, outline attached Distinct, outline attached Epibole Wound Margin: Large (67-100%) None Present (0%) Medium (34-66%) Granulation A mount: Red, Pink N/A  Pink Granulation Quality: Small (1-33%) None Present (0%) Medium (34-66%) Necrotic A mount: Fat Layer (Subcutaneous Tissue): Yes Fat Layer (Subcutaneous Tissue): Yes Fat Layer (Subcutaneous Tissue): Yes Exposed Structures: Fascia: No Fascia: No Fascia: No Tendon: No Tendon: No Tendon: No Muscle: No Muscle: No Muscle: No Joint: No Joint: No Joint: No Bone: No Bone: No Bone: No None Medium (34-66%) Small (1-33%) Epithelialization: Debridement - Excisional N/A Debridement - Excisional Debridement: Pre-procedure Verification/Time Out 14:55 N/A 14:55 Taken: Lidocaine 4% Topical Solution N/A Lidocaine 4% Topical Solution Pain Control: Necrotic/Eschar, Subcutaneous, N/A Necrotic/Eschar, Subcutaneous, Tissue Debrided: [employer] Skin/Subcutaneous Tissue N/A Skin/Subcutaneous Tissue Level: 0.12 N/A 0.07 Debridement A (sq cm): rea Curette N/A Curette Instrument: Minimum N/A Minimum Bleeding: Pressure N/A Pressure Hemostasis Achieved: 0 N/A 0 Procedural Pain: 0 N/A 0 Post Procedural Pain: Debridement Treatment Response: Procedure was tolerated well N/A Procedure was tolerated well Post Debridement Measurements L x 0.5x0.3x0.1 N/A 0.3x0.3x0.3 W x D (cm) 0.012 N/A 0.021 Post Debridement Volume: (cm) Category/Stage III N/A Category/Stage IV Post Debridement Stage: Callus: Yes No Abnormalities Noted No Abnormalities Noted Periwound Skin Texture: Maceration: No No Abnormalities Noted Maceration: No Periwound Skin Moisture: Erythema: Yes Erythema: No Erythema: No Periwound Skin Color: Rubor: No Measured: 1cm N/A N/A Erythema Measurement: No Abnormality No Abnormality No Abnormality Temperature: N/A Yes Yes Tenderness on Palpation: N/A unable to visualize base of the wound N/A Assessment Notes: Debridement N/A Debridement Procedures Performed: Treatment Notes Electronic Signature(s) Signed: 01/13/2023 3:20:30 PM By: Duanne Guess MD FACS Entered By:  Duanne Guess on 01/13/2023 12:10:19 [name] ([id number]) [id number] Page 4 of 11 -------------------------------------------------------------------------------- Multi-Disciplinary Care Plan Details Patient Name: Date of Service: Andrea Yu 01/13/2023 2:15 PM Medical Record Number: [id number] Patient Account Number: [id number] Date of Birth/Sex: Treating RN: 10-Sep-1940 (82 y.o. Billy Coast, Linda Primary Care Shayleen Eppinger: Jarome Matin Other Clinician: Referring Nayab Aten: Treating Judyth Demarais/Extender: Marena Chancy in Treatment: 63 Multidisciplinary Care Plan reviewed with physician Active Inactive Pressure Nursing Diagnoses: Knowledge deficit related to causes and risk factors for pressure ulcer development Knowledge deficit related to management of pressures ulcers Potential for impaired tissue integrity related to pressure, friction, moisture, and shear Goals: Patient/caregiver will verbalize understanding of pressure ulcer management Date Initiated: 11/24/2021 Target Resolution Date: 01/20/2023 Goal Status: Active Interventions: Assess: immobility, friction, shearing, incontinence upon admission and as needed Assess offloading mechanisms upon admission and as needed Assess potential for pressure ulcer upon admission and as needed Provide education on pressure ulcers Treatment Activities: Pressure reduction/relief device ordered : 11/24/2021 Notes: Wound/Skin Impairment Nursing Diagnoses: Impaired tissue integrity Knowledge deficit related to ulceration/compromised skin integrity Goals: Patient/caregiver will verbalize understanding of skin care regimen Date Initiated: 11/24/2021 Target Resolution Date: 01/20/2023 Goal Status:  Active Ulcer/skin breakdown will have a volume reduction of 30% by week 4 Date Initiated: 11/24/2021 Date Inactivated: 05/31/2022 Target Resolution Date: 05/25/2022 Unmet Reason:  refuses VAC, Goal Status: Unmet noncompliant with offloading Interventions: Assess patient/caregiver ability to obtain necessary supplies Assess patient/caregiver ability to perform ulcer/skin care regimen upon admission and as needed Assess ulceration(s) every visit Provide education on ulcer and skin care Treatment Activities: Skin care regimen initiated : 11/24/2021 Topical wound management initiated : 11/24/2021 Notes: Electronic Signature(s) Signed: 01/14/2023 12:52:17 PM By: Zenaida Deed RN, BSN Entered By: Zenaida Deed on 01/13/2023 11:50:54 [name] ([id number]) [id number] Page 5 of 11 -------------------------------------------------------------------------------- Pain Assessment Details Patient Name: Date of Service: Andrea Yu 01/13/2023 2:15 PM Medical Record Number: [id number] Patient Account Number: [id number] Date of Birth/Sex: Treating RN: 1940/10/30 (82 y.o. F) Primary Care Farin Buhman: Jarome Matin Other Clinician: Referring Mackensie Pilson: Treating Naarah Borgerding/Extender: Marena Chancy in Treatment: 59 Active Problems Location of Pain Severity and Description of Pain Patient Has Paino No Site Locations Rate the pain. Current Pain Level: 0 Pain Management and Medication Current Pain Management: Electronic Signature(s) Signed: 01/14/2023 12:52:17 PM By: Zenaida Deed RN, BSN Entered By: Zenaida Deed on 01/13/2023 11:40:45 -------------------------------------------------------------------------------- Patient/Caregiver Education Details Patient Name: Date of Service: Andrea Yu 12/19/2024andnbsp2:15 PM Medical Record Number: [id number] Patient Account Number: [id number] Date of Birth/Gender: Treating RN: 1940/10/31 (82 y.o. [name] Primary Care Physician: Jarome Matin Other Clinician: Referring Physician: Treating Physician/Extender: Marena Chancy in Treatment: 45 Education Assessment Education Provided To: Patient Education Topics Provided Pressure: Methods: Explain/Verbal Responses: Reinforcements needed, State content correctly YVES, STEGEMOLLER A ([id number]) [id number] Page 6 of 11 Wound/Skin Impairment: Methods: Explain/Verbal Responses: Reinforcements needed, State content correctly Electronic Signature(s) Signed: 01/14/2023 12:52:17 PM By: Zenaida Deed RN, BSN Entered By: Zenaida Deed on 01/13/2023 11:51:20 -------------------------------------------------------------------------------- Wound Assessment Details Patient Name: Date of Service: Andrea Yu 01/13/2023 2:15 PM Medical Record Number: [id number] Patient Account Number: [id number] Date of Birth/Sex: Treating RN: 26-Dec-1940 (82 y.o. Billy Coast, Linda Primary Care Shivali Quackenbush: Jarome Matin Other Clinician: Referring Danetra Glock: Treating Kathy Wahid/Extender: Marena Chancy in Treatment: 59 Wound Status Wound Number: 1 Primary Pressure Ulcer Etiology: Wound Location: Right, Medial Malleolus Wound Open Wounding Event: Pressure Injury Status: Date Acquired: 11/16/2021 Comorbid Cataracts, Arrhythmia, Hypertension, Osteoarthritis, Weeks Of Treatment: 59 History: Osteomyelitis, Neuropathy, Confinement Anxiety Clustered Wound: No Photos Wound Measurements Length: (cm) 0.5 Width: (cm) 0.3 Depth: (cm) 0.1 Area: (cm) 0.118 Volume: (cm) 0.012 % Reduction in Area: 92.3% % Reduction in Volume: 92.2% Epithelialization: None Tunneling: No Undermining: No Wound Description Classification: Category/Stage III Wound Margin: Distinct, outline attached Exudate Amount: Small Exudate Type: Serous Exudate Color: amber Foul Odor After Cleansing: No Slough/Fibrino Yes Wound Bed Granulation Amount: Large (67-100%) Exposed Structure Granulation Quality: Red, Pink Fascia Exposed: No Necrotic  Amount: Small (1-33%) Fat Layer (Subcutaneous Tissue) Exposed: Yes Necrotic Quality: Adherent Slough Tendon Exposed: No Muscle Exposed: No Joint Exposed: No Bone Exposed: No Periwound Skin Texture Texture Color PARIS, LAL A ([id number]) [id number] Page 7 of 11 No Abnormalities Noted: No No Abnormalities Noted: No Callus: Yes Erythema: Yes Erythema Measurement: Measured Moisture 1 cm No Abnormalities Noted: Yes Temperature / Pain Temperature: No Abnormality Treatment Notes Wound #1 (Malleolus) Wound Laterality: Right, Medial Cleanser Normal Saline Discharge Instruction: Cleanse the wound with Normal Saline prior to applying a clean dressing using gauze sponges, not tissue or cotton balls. Peri-Wound Care Skin Prep Discharge Instruction: Use  skin prep as directed Topical Primary Dressing Promogran Prisma Matrix, 4.34 (sq in) (silver collagen) Discharge Instruction: Moisten collagen with saline or hydrogel Secondary Dressing Optifoam Non-Adhesive Dressing, 4x4 in Discharge Instruction: Apply over primary dressing cut to make a foam donut Woven Gauze Sponges 2x2 in Discharge Instruction: Apply over primary dressing as directed. Secured With [employer], 4.5x3.1 (in/yd) Discharge Instruction: Secure with Kerlix as directed. 61M Medipore H Soft Cloth Surgical T ape, 4 x 10 (in/yd) Discharge Instruction: Secure with tape as directed. Compression Wrap Compression Stockings Add-Ons Electronic Signature(s) Signed: 01/14/2023 12:52:17 PM By: Zenaida Deed RN, BSN Entered By: Zenaida Deed on 01/13/2023 11:44:55 -------------------------------------------------------------------------------- Wound Assessment Details Patient Name: Date of Service: Andrea Yu 01/13/2023 2:15 PM Medical Record Number: [id number] Patient Account Number: [id number] Date of Birth/Sex: Treating RN: 03-22-1940 (82 y.o. [name] Primary Care  Lemonte Al: Jarome Matin Other Clinician: Referring Kieon Lawhorn: Treating Juda Lajeunesse/Extender: Marena Chancy in Treatment: 59 Wound Status Wound Number: 2 Primary Abscess Etiology: Wound Location: Right Gluteus Wound Open Wounding Event: Bump Status: Date Acquired: 09/28/2021 Comorbid Cataracts, Arrhythmia, Hypertension, Osteoarthritis, Weeks Of Treatment: 59 History: Osteomyelitis, Neuropathy, Confinement Anxiety Clustered Wound: No Photos ANTIONETTA, HUMENIK A ([id number]) [phone].pdf Page 8 of 11 Wound Measurements Length: (cm) 0.2 Width: (cm) 0.6 Depth: (cm) 2.6 Area: (cm) 0.094 Volume: (cm) 0.245 % Reduction in Area: 99.1% % Reduction in Volume: 99.2% Epithelialization: Medium (34-66%) Tunneling: No Undermining: No Wound Description Classification: Full Thickness With Exposed Support Structures Wound Margin: Distinct, outline attached Exudate Amount: Medium Exudate Type: Serosanguineous Exudate Color: red, brown Foul Odor After Cleansing: No Slough/Fibrino No Wound Bed Granulation Amount: None Present (0%) Exposed Structure Necrotic Amount: None Present (0%) Fascia Exposed: No Fat Layer (Subcutaneous Tissue) Exposed: Yes Tendon Exposed: No Muscle Exposed: No Joint Exposed: No Bone Exposed: No Periwound Skin Texture Texture Color No Abnormalities Noted: Yes No Abnormalities Noted: Yes Moisture Temperature / Pain No Abnormalities Noted: Yes Temperature: No Abnormality Tenderness on Palpation: Yes Assessment Notes unable to visualize base of the wound Treatment Notes Wound #2 (Gluteus) Wound Laterality: Right Cleanser Normal Saline Discharge Instruction: Cleanse the wound with Normal Saline prior to applying a clean dressing using gauze sponges, not tissue or cotton balls. Peri-Wound Care Skin Prep Discharge Instruction: Use skin prep as directed Topical Primary Dressing Dakin's Solution 0.25%, 16  (oz) Discharge Instruction: Moisten gauze with Dakin's solution Promogran Prisma Matrix, 4.34 (sq in) (silver collagen) Discharge Instruction: Moisten collagen with saline or hydrogel Secondary Dressing Zetuvit Plus Silicone Border Dressing 4x4 (in/in) Discharge Instruction: Apply silicone border over primary dressing as directed. Secured With Compression SHEMIAH, DEERY A ([id number]) [id number] Page 9 of 11 Compression Stockings Add-Ons Electronic Signature(s) Signed: 01/14/2023 12:52:17 PM By: Zenaida Deed RN, BSN Entered By: Zenaida Deed on 01/13/2023 11:50:21 -------------------------------------------------------------------------------- Wound Assessment Details Patient Name: Date of Service: Andrea Yu 01/13/2023 2:15 PM Medical Record Number: [id number] Patient Account Number: [id number] Date of Birth/Sex: Treating RN: 03/17/1940 (82 y.o. F) Primary Care Mikyle Sox: Jarome Matin Other Clinician: Referring Tiny Rietz: Treating Dajha Urquilla/Extender: Marena Chancy in Treatment: 59 Wound Status Wound Number: 4 Primary Pressure Ulcer Etiology: Wound Location: Left, Lateral Ankle Wound Open Wounding Event: Pressure Injury Status: Date Acquired: 12/23/2021 Comorbid Cataracts, Arrhythmia, Hypertension, Osteoarthritis, Weeks Of Treatment: 53 History: Osteomyelitis, Neuropathy, Confinement Anxiety Clustered Wound: No Photos Wound Measurements Length: (cm) 0.3 Width: (cm) 0.3 Depth: (cm) 0.3 Area: (cm) 0.071 Volume: (cm) 0.021 % Reduction in Area: 49.6% % Reduction  in Volume: -50% Epithelialization: Small (1-33%) Tunneling: No Undermining: Yes Starting Position (o'clock): 12 Ending Position (o'clock): 12 Maximum Distance: (cm) 0.3 Wound Description Classification: Category/Stage IV Wound Margin: Epibole Exudate Amount: Medium Exudate Type: Serous Exudate Color: amber Foul Odor After Cleansing:  No Slough/Fibrino Yes Wound Bed Granulation Amount: Medium (34-66%) Exposed Structure Granulation Quality: Pink Fascia Exposed: No Necrotic Amount: Medium (34-66%) Fat Layer (Subcutaneous Tissue) Exposed: Yes Necrotic Quality: Adherent Slough Tendon Exposed: No Muscle Exposed: No Joint Exposed: No Bone Exposed: No [place]LOTTA, VANBLARCOM A ([id number]) [id number] Page 10 of 11 Periwound Skin Texture Texture Color No Abnormalities Noted: Yes No Abnormalities Noted: No Erythema: No Moisture Rubor: No No Abnormalities Noted: Yes Temperature / Pain Temperature: No Abnormality Tenderness on Palpation: Yes Treatment Notes Wound #4 (Ankle) Wound Laterality: Left, Lateral Cleanser Normal Saline Discharge Instruction: Cleanse the wound with Normal Saline prior to applying a clean dressing using gauze sponges, not tissue or cotton balls. Peri-Wound Care Skin Prep Discharge Instruction: Use skin prep as directed Topical Primary Dressing Promogran Prisma Matrix, 4.34 (sq in) (silver collagen) Discharge Instruction: Moisten collagen with saline or hydrogel Secondary Dressing Optifoam Non-Adhesive Dressing, 4x4 in Discharge Instruction: Apply over primary dressing cut to make a foam donut Woven Gauze Sponges 2x2 in Discharge Instruction: Apply over primary dressing as directed. Secured With [employer], 4.5x3.1 (in/yd) Discharge Instruction: Secure with Kerlix as directed. 2M Medipore H Soft Cloth Surgical T ape, 4 x 10 (in/yd) Discharge Instruction: Secure with tape as directed. Compression Wrap Compression Stockings Add-Ons Electronic Signature(s) Signed: 01/14/2023 12:52:17 PM By: Zenaida Deed RN, BSN Entered By: Zenaida Deed on 01/13/2023 11:46:59 -------------------------------------------------------------------------------- Vitals Details Patient Name: Date of Service: Andrea Yu 01/13/2023 2:15 PM Medical Record Number:  [id number] Patient Account Number: [id number] Date of Birth/Sex: Treating RN: July 11, 1940 (82 y.o. F) Primary Care Lamonta Cypress: Jarome Matin Other Clinician: Referring Volanda Mangine: Treating Tranika Scholler/Extender: Marena Chancy in Treatment: 59 Vital Signs Time Taken: 02:24 Temperature (F): 97.8 Height (in): 59 Pulse (bpm): 76 Weight (lbs): 155 Respiratory Rate (breaths/min): 18 Body Mass Index (BMI): 31.3 Blood Pressure (mmHg): 132/72 Reference Range: 80 - 120 mg / dl JASIEL, FLORENCIO A ([id number]) [id number] Page 11 of 11 Electronic Signature(s) Signed: 01/14/2023 12:52:17 PM By: Zenaida Deed RN, BSN Entered By: Zenaida Deed on 01/13/2023 11:40:37

## 2023-02-01 ENCOUNTER — Ambulatory Visit (HOSPITAL_BASED_OUTPATIENT_CLINIC_OR_DEPARTMENT_OTHER): Payer: Medicare Other | Admitting: General Surgery

## 2023-02-08 ENCOUNTER — Ambulatory Visit (HOSPITAL_BASED_OUTPATIENT_CLINIC_OR_DEPARTMENT_OTHER): Payer: Medicare Other | Admitting: General Surgery

## 2023-02-15 ENCOUNTER — Ambulatory Visit (HOSPITAL_BASED_OUTPATIENT_CLINIC_OR_DEPARTMENT_OTHER): Payer: Medicare Other | Admitting: General Surgery

## 2023-02-28 ENCOUNTER — Encounter (HOSPITAL_BASED_OUTPATIENT_CLINIC_OR_DEPARTMENT_OTHER): Payer: Medicare Other | Attending: General Surgery | Admitting: General Surgery

## 2023-02-28 DIAGNOSIS — I10 Essential (primary) hypertension: Secondary | ICD-10-CM | POA: Diagnosis not present

## 2023-02-28 DIAGNOSIS — L98415 Non-pressure chronic ulcer of buttock with muscle involvement without evidence of necrosis: Secondary | ICD-10-CM | POA: Insufficient documentation

## 2023-02-28 DIAGNOSIS — L0231 Cutaneous abscess of buttock: Secondary | ICD-10-CM | POA: Diagnosis not present

## 2023-02-28 DIAGNOSIS — E2740 Unspecified adrenocortical insufficiency: Secondary | ICD-10-CM | POA: Diagnosis not present

## 2023-02-28 DIAGNOSIS — Z7952 Long term (current) use of systemic steroids: Secondary | ICD-10-CM | POA: Diagnosis not present

## 2023-02-28 DIAGNOSIS — L89523 Pressure ulcer of left ankle, stage 3: Secondary | ICD-10-CM | POA: Diagnosis present

## 2023-03-17 ENCOUNTER — Ambulatory Visit (HOSPITAL_BASED_OUTPATIENT_CLINIC_OR_DEPARTMENT_OTHER): Payer: Medicare Other | Admitting: General Surgery

## 2023-03-30 ENCOUNTER — Ambulatory Visit (HOSPITAL_BASED_OUTPATIENT_CLINIC_OR_DEPARTMENT_OTHER): Payer: Medicare Other | Admitting: General Surgery

## 2023-04-01 ENCOUNTER — Encounter (HOSPITAL_BASED_OUTPATIENT_CLINIC_OR_DEPARTMENT_OTHER): Attending: General Surgery | Admitting: General Surgery

## 2023-04-01 DIAGNOSIS — I10 Essential (primary) hypertension: Secondary | ICD-10-CM | POA: Diagnosis not present

## 2023-04-01 DIAGNOSIS — E2740 Unspecified adrenocortical insufficiency: Secondary | ICD-10-CM | POA: Diagnosis not present

## 2023-04-01 DIAGNOSIS — L98415 Non-pressure chronic ulcer of buttock with muscle involvement without evidence of necrosis: Secondary | ICD-10-CM | POA: Diagnosis not present

## 2023-04-01 DIAGNOSIS — Z7952 Long term (current) use of systemic steroids: Secondary | ICD-10-CM | POA: Diagnosis not present

## 2023-04-01 DIAGNOSIS — L89523 Pressure ulcer of left ankle, stage 3: Secondary | ICD-10-CM | POA: Insufficient documentation

## 2023-04-01 DIAGNOSIS — L0231 Cutaneous abscess of buttock: Secondary | ICD-10-CM | POA: Insufficient documentation

## 2023-04-19 ENCOUNTER — Encounter (HOSPITAL_BASED_OUTPATIENT_CLINIC_OR_DEPARTMENT_OTHER): Admitting: General Surgery

## 2023-04-19 DIAGNOSIS — L89523 Pressure ulcer of left ankle, stage 3: Secondary | ICD-10-CM | POA: Diagnosis not present

## 2023-05-03 ENCOUNTER — Encounter (HOSPITAL_BASED_OUTPATIENT_CLINIC_OR_DEPARTMENT_OTHER): Attending: General Surgery | Admitting: General Surgery

## 2023-05-03 DIAGNOSIS — I10 Essential (primary) hypertension: Secondary | ICD-10-CM | POA: Diagnosis not present

## 2023-05-03 DIAGNOSIS — L98415 Non-pressure chronic ulcer of buttock with muscle involvement without evidence of necrosis: Secondary | ICD-10-CM | POA: Diagnosis not present

## 2023-05-03 DIAGNOSIS — E2740 Unspecified adrenocortical insufficiency: Secondary | ICD-10-CM | POA: Diagnosis not present

## 2023-05-03 DIAGNOSIS — L0231 Cutaneous abscess of buttock: Secondary | ICD-10-CM | POA: Insufficient documentation

## 2023-05-03 DIAGNOSIS — L89523 Pressure ulcer of left ankle, stage 3: Secondary | ICD-10-CM | POA: Insufficient documentation

## 2023-05-03 DIAGNOSIS — Z7952 Long term (current) use of systemic steroids: Secondary | ICD-10-CM | POA: Diagnosis not present

## 2023-05-17 ENCOUNTER — Encounter (HOSPITAL_BASED_OUTPATIENT_CLINIC_OR_DEPARTMENT_OTHER): Admitting: General Surgery

## 2023-05-17 DIAGNOSIS — L89523 Pressure ulcer of left ankle, stage 3: Secondary | ICD-10-CM | POA: Diagnosis not present

## 2023-05-29 ENCOUNTER — Other Ambulatory Visit: Payer: Self-pay | Admitting: Cardiovascular Disease

## 2023-05-31 ENCOUNTER — Ambulatory Visit (HOSPITAL_BASED_OUTPATIENT_CLINIC_OR_DEPARTMENT_OTHER): Admitting: General Surgery

## 2023-06-07 ENCOUNTER — Encounter (HOSPITAL_BASED_OUTPATIENT_CLINIC_OR_DEPARTMENT_OTHER): Attending: General Surgery | Admitting: Internal Medicine

## 2023-06-07 DIAGNOSIS — E2740 Unspecified adrenocortical insufficiency: Secondary | ICD-10-CM | POA: Diagnosis not present

## 2023-06-07 DIAGNOSIS — I10 Essential (primary) hypertension: Secondary | ICD-10-CM | POA: Insufficient documentation

## 2023-06-07 DIAGNOSIS — L0231 Cutaneous abscess of buttock: Secondary | ICD-10-CM | POA: Insufficient documentation

## 2023-06-07 DIAGNOSIS — L98415 Non-pressure chronic ulcer of buttock with muscle involvement without evidence of necrosis: Secondary | ICD-10-CM | POA: Insufficient documentation

## 2023-06-07 DIAGNOSIS — L89523 Pressure ulcer of left ankle, stage 3: Secondary | ICD-10-CM | POA: Diagnosis present

## 2023-06-07 DIAGNOSIS — Z7952 Long term (current) use of systemic steroids: Secondary | ICD-10-CM | POA: Insufficient documentation

## 2023-06-11 ENCOUNTER — Other Ambulatory Visit: Payer: Self-pay | Admitting: Cardiovascular Disease

## 2023-06-20 ENCOUNTER — Other Ambulatory Visit: Payer: Self-pay | Admitting: Cardiovascular Disease

## 2023-06-28 ENCOUNTER — Encounter (HOSPITAL_BASED_OUTPATIENT_CLINIC_OR_DEPARTMENT_OTHER): Attending: General Surgery | Admitting: General Surgery

## 2023-06-28 DIAGNOSIS — L98415 Non-pressure chronic ulcer of buttock with muscle involvement without evidence of necrosis: Secondary | ICD-10-CM | POA: Diagnosis not present

## 2023-06-28 DIAGNOSIS — Z7952 Long term (current) use of systemic steroids: Secondary | ICD-10-CM | POA: Diagnosis not present

## 2023-06-28 DIAGNOSIS — E2740 Unspecified adrenocortical insufficiency: Secondary | ICD-10-CM | POA: Diagnosis not present

## 2023-06-28 DIAGNOSIS — I10 Essential (primary) hypertension: Secondary | ICD-10-CM | POA: Insufficient documentation

## 2023-06-28 DIAGNOSIS — L89523 Pressure ulcer of left ankle, stage 3: Secondary | ICD-10-CM | POA: Diagnosis present

## 2023-06-28 DIAGNOSIS — L0231 Cutaneous abscess of buttock: Secondary | ICD-10-CM | POA: Insufficient documentation

## 2023-07-04 ENCOUNTER — Other Ambulatory Visit: Payer: Self-pay | Admitting: Cardiovascular Disease

## 2023-07-06 ENCOUNTER — Other Ambulatory Visit: Payer: Self-pay | Admitting: Cardiovascular Disease

## 2023-07-16 ENCOUNTER — Other Ambulatory Visit: Payer: Self-pay | Admitting: Cardiovascular Disease

## 2023-07-19 ENCOUNTER — Ambulatory Visit (HOSPITAL_BASED_OUTPATIENT_CLINIC_OR_DEPARTMENT_OTHER): Admitting: General Surgery

## 2023-07-21 ENCOUNTER — Other Ambulatory Visit: Payer: Self-pay | Admitting: Cardiovascular Disease

## 2023-07-25 ENCOUNTER — Encounter (HOSPITAL_BASED_OUTPATIENT_CLINIC_OR_DEPARTMENT_OTHER): Admitting: General Surgery

## 2023-07-25 DIAGNOSIS — L89523 Pressure ulcer of left ankle, stage 3: Secondary | ICD-10-CM | POA: Diagnosis not present

## 2023-08-09 ENCOUNTER — Encounter (HOSPITAL_BASED_OUTPATIENT_CLINIC_OR_DEPARTMENT_OTHER): Attending: General Surgery | Admitting: General Surgery

## 2023-08-09 DIAGNOSIS — E2740 Unspecified adrenocortical insufficiency: Secondary | ICD-10-CM | POA: Diagnosis not present

## 2023-08-09 DIAGNOSIS — L0231 Cutaneous abscess of buttock: Secondary | ICD-10-CM | POA: Insufficient documentation

## 2023-08-09 DIAGNOSIS — L98415 Non-pressure chronic ulcer of buttock with muscle involvement without evidence of necrosis: Secondary | ICD-10-CM | POA: Insufficient documentation

## 2023-08-09 DIAGNOSIS — Z7952 Long term (current) use of systemic steroids: Secondary | ICD-10-CM | POA: Diagnosis not present

## 2023-08-09 DIAGNOSIS — I10 Essential (primary) hypertension: Secondary | ICD-10-CM | POA: Diagnosis not present

## 2023-08-09 DIAGNOSIS — L89523 Pressure ulcer of left ankle, stage 3: Secondary | ICD-10-CM | POA: Diagnosis present

## 2023-08-25 ENCOUNTER — Encounter (HOSPITAL_BASED_OUTPATIENT_CLINIC_OR_DEPARTMENT_OTHER): Admitting: General Surgery

## 2023-08-25 DIAGNOSIS — L89523 Pressure ulcer of left ankle, stage 3: Secondary | ICD-10-CM | POA: Diagnosis not present

## 2023-09-06 ENCOUNTER — Encounter (HOSPITAL_BASED_OUTPATIENT_CLINIC_OR_DEPARTMENT_OTHER): Attending: General Surgery | Admitting: General Surgery

## 2023-09-06 DIAGNOSIS — Z7952 Long term (current) use of systemic steroids: Secondary | ICD-10-CM | POA: Insufficient documentation

## 2023-09-06 DIAGNOSIS — E2740 Unspecified adrenocortical insufficiency: Secondary | ICD-10-CM | POA: Diagnosis not present

## 2023-09-06 DIAGNOSIS — L89523 Pressure ulcer of left ankle, stage 3: Secondary | ICD-10-CM | POA: Insufficient documentation

## 2023-09-06 DIAGNOSIS — L98415 Non-pressure chronic ulcer of buttock with muscle involvement without evidence of necrosis: Secondary | ICD-10-CM | POA: Diagnosis not present

## 2023-09-06 DIAGNOSIS — I10 Essential (primary) hypertension: Secondary | ICD-10-CM | POA: Insufficient documentation

## 2023-09-06 DIAGNOSIS — L97529 Non-pressure chronic ulcer of other part of left foot with unspecified severity: Secondary | ICD-10-CM | POA: Diagnosis not present

## 2023-09-06 DIAGNOSIS — L0231 Cutaneous abscess of buttock: Secondary | ICD-10-CM | POA: Insufficient documentation

## 2023-09-06 DIAGNOSIS — L97822 Non-pressure chronic ulcer of other part of left lower leg with fat layer exposed: Secondary | ICD-10-CM | POA: Insufficient documentation

## 2023-09-08 ENCOUNTER — Other Ambulatory Visit: Payer: Self-pay | Admitting: Cardiovascular Disease

## 2023-09-20 ENCOUNTER — Encounter (HOSPITAL_BASED_OUTPATIENT_CLINIC_OR_DEPARTMENT_OTHER): Admitting: General Surgery

## 2023-09-20 DIAGNOSIS — L89523 Pressure ulcer of left ankle, stage 3: Secondary | ICD-10-CM | POA: Diagnosis not present

## 2023-10-04 ENCOUNTER — Encounter (HOSPITAL_BASED_OUTPATIENT_CLINIC_OR_DEPARTMENT_OTHER): Attending: General Surgery | Admitting: General Surgery

## 2023-10-04 DIAGNOSIS — Z7952 Long term (current) use of systemic steroids: Secondary | ICD-10-CM | POA: Insufficient documentation

## 2023-10-04 DIAGNOSIS — L98415 Non-pressure chronic ulcer of buttock with muscle involvement without evidence of necrosis: Secondary | ICD-10-CM | POA: Insufficient documentation

## 2023-10-04 DIAGNOSIS — L97529 Non-pressure chronic ulcer of other part of left foot with unspecified severity: Secondary | ICD-10-CM | POA: Diagnosis not present

## 2023-10-04 DIAGNOSIS — L89523 Pressure ulcer of left ankle, stage 3: Secondary | ICD-10-CM | POA: Insufficient documentation

## 2023-10-04 DIAGNOSIS — L0231 Cutaneous abscess of buttock: Secondary | ICD-10-CM | POA: Insufficient documentation

## 2023-10-04 DIAGNOSIS — I10 Essential (primary) hypertension: Secondary | ICD-10-CM | POA: Diagnosis not present

## 2023-10-04 DIAGNOSIS — L97822 Non-pressure chronic ulcer of other part of left lower leg with fat layer exposed: Secondary | ICD-10-CM | POA: Diagnosis not present

## 2023-10-04 DIAGNOSIS — E2740 Unspecified adrenocortical insufficiency: Secondary | ICD-10-CM | POA: Diagnosis not present

## 2023-10-18 ENCOUNTER — Ambulatory Visit (HOSPITAL_BASED_OUTPATIENT_CLINIC_OR_DEPARTMENT_OTHER): Admitting: General Surgery

## 2023-10-26 ENCOUNTER — Encounter (HOSPITAL_BASED_OUTPATIENT_CLINIC_OR_DEPARTMENT_OTHER): Attending: General Surgery | Admitting: Internal Medicine

## 2023-10-26 DIAGNOSIS — Z7952 Long term (current) use of systemic steroids: Secondary | ICD-10-CM | POA: Insufficient documentation

## 2023-10-26 DIAGNOSIS — I10 Essential (primary) hypertension: Secondary | ICD-10-CM | POA: Diagnosis not present

## 2023-10-26 DIAGNOSIS — L97529 Non-pressure chronic ulcer of other part of left foot with unspecified severity: Secondary | ICD-10-CM | POA: Diagnosis not present

## 2023-10-26 DIAGNOSIS — E2740 Unspecified adrenocortical insufficiency: Secondary | ICD-10-CM | POA: Diagnosis not present

## 2023-10-26 DIAGNOSIS — L89523 Pressure ulcer of left ankle, stage 3: Secondary | ICD-10-CM | POA: Diagnosis present

## 2023-10-26 DIAGNOSIS — L98415 Non-pressure chronic ulcer of buttock with muscle involvement without evidence of necrosis: Secondary | ICD-10-CM | POA: Insufficient documentation

## 2023-10-26 DIAGNOSIS — L0231 Cutaneous abscess of buttock: Secondary | ICD-10-CM | POA: Insufficient documentation

## 2023-11-04 ENCOUNTER — Ambulatory Visit (HOSPITAL_BASED_OUTPATIENT_CLINIC_OR_DEPARTMENT_OTHER): Admitting: General Surgery

## 2023-11-09 ENCOUNTER — Ambulatory Visit (HOSPITAL_BASED_OUTPATIENT_CLINIC_OR_DEPARTMENT_OTHER): Admitting: General Surgery

## 2023-11-15 ENCOUNTER — Encounter (HOSPITAL_BASED_OUTPATIENT_CLINIC_OR_DEPARTMENT_OTHER): Admitting: General Surgery

## 2023-11-15 DIAGNOSIS — L89523 Pressure ulcer of left ankle, stage 3: Secondary | ICD-10-CM | POA: Diagnosis not present

## 2023-11-24 ENCOUNTER — Ambulatory Visit (HOSPITAL_BASED_OUTPATIENT_CLINIC_OR_DEPARTMENT_OTHER): Admitting: General Surgery

## 2023-11-29 ENCOUNTER — Encounter (HOSPITAL_BASED_OUTPATIENT_CLINIC_OR_DEPARTMENT_OTHER): Attending: General Surgery | Admitting: General Surgery

## 2023-11-29 DIAGNOSIS — I10 Essential (primary) hypertension: Secondary | ICD-10-CM | POA: Insufficient documentation

## 2023-11-29 DIAGNOSIS — L89523 Pressure ulcer of left ankle, stage 3: Secondary | ICD-10-CM | POA: Insufficient documentation

## 2023-11-29 DIAGNOSIS — L98415 Non-pressure chronic ulcer of buttock with muscle involvement without evidence of necrosis: Secondary | ICD-10-CM | POA: Diagnosis not present

## 2023-11-29 DIAGNOSIS — E2740 Unspecified adrenocortical insufficiency: Secondary | ICD-10-CM | POA: Insufficient documentation

## 2023-11-29 DIAGNOSIS — L0231 Cutaneous abscess of buttock: Secondary | ICD-10-CM | POA: Insufficient documentation

## 2023-11-29 DIAGNOSIS — L97529 Non-pressure chronic ulcer of other part of left foot with unspecified severity: Secondary | ICD-10-CM | POA: Insufficient documentation

## 2023-11-29 DIAGNOSIS — Z7952 Long term (current) use of systemic steroids: Secondary | ICD-10-CM | POA: Diagnosis not present

## 2023-12-06 ENCOUNTER — Encounter (HOSPITAL_BASED_OUTPATIENT_CLINIC_OR_DEPARTMENT_OTHER): Admitting: General Surgery

## 2023-12-06 DIAGNOSIS — L89523 Pressure ulcer of left ankle, stage 3: Secondary | ICD-10-CM | POA: Diagnosis not present

## 2023-12-13 ENCOUNTER — Encounter (HOSPITAL_BASED_OUTPATIENT_CLINIC_OR_DEPARTMENT_OTHER): Admitting: General Surgery

## 2023-12-13 DIAGNOSIS — L89523 Pressure ulcer of left ankle, stage 3: Secondary | ICD-10-CM | POA: Diagnosis not present

## 2023-12-21 ENCOUNTER — Encounter (HOSPITAL_BASED_OUTPATIENT_CLINIC_OR_DEPARTMENT_OTHER): Admitting: General Surgery

## 2023-12-21 DIAGNOSIS — L89523 Pressure ulcer of left ankle, stage 3: Secondary | ICD-10-CM | POA: Diagnosis not present

## 2023-12-27 ENCOUNTER — Encounter (HOSPITAL_BASED_OUTPATIENT_CLINIC_OR_DEPARTMENT_OTHER): Admitting: General Surgery

## 2024-01-03 ENCOUNTER — Encounter (HOSPITAL_BASED_OUTPATIENT_CLINIC_OR_DEPARTMENT_OTHER): Admitting: Internal Medicine

## 2024-01-03 DIAGNOSIS — L97822 Non-pressure chronic ulcer of other part of left lower leg with fat layer exposed: Secondary | ICD-10-CM | POA: Insufficient documentation

## 2024-01-03 DIAGNOSIS — L89523 Pressure ulcer of left ankle, stage 3: Secondary | ICD-10-CM | POA: Insufficient documentation

## 2024-01-03 DIAGNOSIS — L97812 Non-pressure chronic ulcer of other part of right lower leg with fat layer exposed: Secondary | ICD-10-CM | POA: Insufficient documentation

## 2024-01-03 DIAGNOSIS — L98415 Non-pressure chronic ulcer of buttock with muscle involvement without evidence of necrosis: Secondary | ICD-10-CM | POA: Insufficient documentation

## 2024-01-03 DIAGNOSIS — L97529 Non-pressure chronic ulcer of other part of left foot with unspecified severity: Secondary | ICD-10-CM | POA: Insufficient documentation

## 2024-01-03 DIAGNOSIS — E2740 Unspecified adrenocortical insufficiency: Secondary | ICD-10-CM | POA: Insufficient documentation

## 2024-01-03 DIAGNOSIS — L0231 Cutaneous abscess of buttock: Secondary | ICD-10-CM | POA: Insufficient documentation

## 2024-01-03 DIAGNOSIS — Z7952 Long term (current) use of systemic steroids: Secondary | ICD-10-CM | POA: Insufficient documentation

## 2024-01-03 DIAGNOSIS — L89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, stage 3: Secondary | ICD-10-CM | POA: Insufficient documentation

## 2024-01-03 DIAGNOSIS — I10 Essential (primary) hypertension: Secondary | ICD-10-CM | POA: Insufficient documentation

## 2024-01-10 ENCOUNTER — Encounter (HOSPITAL_BASED_OUTPATIENT_CLINIC_OR_DEPARTMENT_OTHER): Admitting: General Surgery

## 2024-01-17 ENCOUNTER — Encounter (HOSPITAL_BASED_OUTPATIENT_CLINIC_OR_DEPARTMENT_OTHER): Admitting: General Surgery

## 2024-01-24 ENCOUNTER — Encounter (HOSPITAL_BASED_OUTPATIENT_CLINIC_OR_DEPARTMENT_OTHER): Admitting: General Surgery

## 2024-02-02 ENCOUNTER — Encounter (HOSPITAL_BASED_OUTPATIENT_CLINIC_OR_DEPARTMENT_OTHER): Attending: General Surgery | Admitting: General Surgery

## 2024-02-02 DIAGNOSIS — L89523 Pressure ulcer of left ankle, stage 3: Secondary | ICD-10-CM | POA: Diagnosis present

## 2024-02-02 DIAGNOSIS — L98412 Non-pressure chronic ulcer of buttock with fat layer exposed: Secondary | ICD-10-CM | POA: Diagnosis not present

## 2024-02-02 DIAGNOSIS — E2740 Unspecified adrenocortical insufficiency: Secondary | ICD-10-CM | POA: Diagnosis not present

## 2024-02-02 DIAGNOSIS — Z7952 Long term (current) use of systemic steroids: Secondary | ICD-10-CM | POA: Diagnosis not present

## 2024-02-02 DIAGNOSIS — I10 Essential (primary) hypertension: Secondary | ICD-10-CM | POA: Diagnosis not present

## 2024-02-14 ENCOUNTER — Encounter (HOSPITAL_BASED_OUTPATIENT_CLINIC_OR_DEPARTMENT_OTHER): Admitting: General Surgery

## 2024-02-14 DIAGNOSIS — L89523 Pressure ulcer of left ankle, stage 3: Secondary | ICD-10-CM | POA: Diagnosis not present

## 2024-02-21 ENCOUNTER — Encounter (HOSPITAL_BASED_OUTPATIENT_CLINIC_OR_DEPARTMENT_OTHER): Admitting: General Surgery

## 2024-02-28 ENCOUNTER — Encounter (HOSPITAL_BASED_OUTPATIENT_CLINIC_OR_DEPARTMENT_OTHER): Admitting: General Surgery

## 2024-03-06 ENCOUNTER — Encounter (HOSPITAL_BASED_OUTPATIENT_CLINIC_OR_DEPARTMENT_OTHER): Admitting: General Surgery

## 2024-03-13 ENCOUNTER — Encounter (HOSPITAL_BASED_OUTPATIENT_CLINIC_OR_DEPARTMENT_OTHER): Admitting: General Surgery

## 2024-03-20 ENCOUNTER — Encounter (HOSPITAL_BASED_OUTPATIENT_CLINIC_OR_DEPARTMENT_OTHER): Admitting: General Surgery
